# Patient Record
Sex: Female | Born: 1960
Health system: Southern US, Community
[De-identification: ages and names within clinical notes are randomized; demographics above are authoritative.]

## PROBLEM LIST (undated history)

## (undated) ENCOUNTER — Emergency Department (HOSPITAL_COMMUNITY): Admission: EM | Disposition: A | Discharge status: Still In Hospital | Payer: Medicare Other

## (undated) DIAGNOSIS — Z9114 Patient's other noncompliance with medication regimen: Secondary | ICD-10-CM

## (undated) DIAGNOSIS — F209 Schizophrenia, unspecified: Secondary | ICD-10-CM

## (undated) DIAGNOSIS — Z91148 Patient's other noncompliance with medication regimen for other reason: Secondary | ICD-10-CM

---

## 1998-10-22 ENCOUNTER — Emergency Department (HOSPITAL_COMMUNITY): Admission: EM | Admit: 1998-10-22 | Discharge: 1998-10-22 | Payer: Self-pay | Admitting: Emergency Medicine

## 1998-10-22 ENCOUNTER — Encounter: Payer: Self-pay | Admitting: Emergency Medicine

## 1999-02-11 ENCOUNTER — Other Ambulatory Visit: Admission: RE | Admit: 1999-02-11 | Discharge: 1999-02-11 | Payer: Self-pay | Admitting: Internal Medicine

## 2004-12-28 ENCOUNTER — Emergency Department (HOSPITAL_COMMUNITY): Admission: EM | Admit: 2004-12-28 | Discharge: 2004-12-28 | Payer: Self-pay | Admitting: *Deleted

## 2005-09-24 ENCOUNTER — Emergency Department (HOSPITAL_COMMUNITY): Admission: EM | Admit: 2005-09-24 | Discharge: 2005-09-24 | Payer: Self-pay | Admitting: Emergency Medicine

## 2006-02-15 ENCOUNTER — Emergency Department (HOSPITAL_COMMUNITY): Admission: EM | Admit: 2006-02-15 | Discharge: 2006-02-16 | Payer: Self-pay | Admitting: *Deleted

## 2006-05-29 ENCOUNTER — Emergency Department (HOSPITAL_COMMUNITY): Admission: EM | Admit: 2006-05-29 | Discharge: 2006-05-30 | Payer: Self-pay | Admitting: Emergency Medicine

## 2011-11-03 ENCOUNTER — Emergency Department (HOSPITAL_COMMUNITY)
Admission: EM | Admit: 2011-11-03 | Discharge: 2011-11-04 | Disposition: A | Discharge status: Other Acute Inpatient Hospital | Payer: Self-pay | Attending: Emergency Medicine | Admitting: Emergency Medicine

## 2011-11-04 ENCOUNTER — Emergency Department (HOSPITAL_COMMUNITY)
Admission: EM | Admit: 2011-11-04 | Discharge: 2011-11-06 | Disposition: A | Discharge status: Other Acute Inpatient Hospital | Payer: Medicare Other | Attending: Emergency Medicine | Admitting: Emergency Medicine

## 2011-11-04 ENCOUNTER — Encounter (HOSPITAL_COMMUNITY): Payer: Self-pay | Admitting: Emergency Medicine

## 2011-11-04 ENCOUNTER — Encounter (HOSPITAL_COMMUNITY): Payer: Self-pay

## 2011-11-04 DIAGNOSIS — R443 Hallucinations, unspecified: Secondary | ICD-10-CM | POA: Diagnosis not present

## 2011-11-04 DIAGNOSIS — F17200 Nicotine dependence, unspecified, uncomplicated: Secondary | ICD-10-CM | POA: Insufficient documentation

## 2011-11-04 DIAGNOSIS — F200 Paranoid schizophrenia: Secondary | ICD-10-CM | POA: Diagnosis not present

## 2011-11-04 HISTORY — DX: Schizophrenia, unspecified: F20.9

## 2011-11-04 LAB — COMPREHENSIVE METABOLIC PANEL
ALT: 5 U/L (ref 0–35)
AST: 14 U/L (ref 0–37)
Albumin: 4.2 g/dL (ref 3.5–5.2)
Alkaline Phosphatase: 78 U/L (ref 39–117)
BUN: 10 mg/dL (ref 6–23)
CO2: 26 mEq/L (ref 19–32)
Calcium: 9.3 mg/dL (ref 8.4–10.5)
Chloride: 104 mEq/L (ref 96–112)
Creatinine, Ser: 0.72 mg/dL (ref 0.50–1.10)
GFR calc Af Amer: >90 mL/min (ref >90)
GFR calc non Af Amer: >90 mL/min (ref >90)
Glucose, Bld: 116 mg/dL — ABNORMAL HIGH (ref 70–99)
Potassium: 3.4 mEq/L — ABNORMAL LOW (ref 3.5–5.1)
Sodium: 142 mEq/L (ref 135–145)
Total Bilirubin: 0.1 mg/dL — ABNORMAL LOW (ref 0.3–1.2)
Total Protein: 7.6 g/dL (ref 6.0–8.3)

## 2011-11-04 LAB — CBC
HCT: 42.6 % (ref 36.0–46.0)
Hemoglobin: 14.8 g/dL (ref 12.0–15.0)
MCH: 29.1 pg (ref 26.0–34.0)
MCHC: 34.7 g/dL (ref 30.0–36.0)
MCV: 83.7 fL (ref 78.0–100.0)
Platelets: 248 10*3/uL (ref 150–400)
RBC: 5.09 MIL/uL (ref 3.87–5.11)
RDW: 12.6 % (ref 11.5–15.5)
WBC: 6.4 10*3/uL (ref 4.0–10.5)

## 2011-11-04 LAB — ACETAMINOPHEN LEVEL: Acetaminophen (Tylenol), Serum: <15 ug/mL (ref 10–30)

## 2011-11-04 LAB — RAPID URINE DRUG SCREEN, HOSP PERFORMED
Amphetamines: NOT DETECTED
Barbiturates: NOT DETECTED
Benzodiazepines: NOT DETECTED
Cocaine: NOT DETECTED
Opiates: NOT DETECTED
Tetrahydrocannabinol: NOT DETECTED

## 2011-11-04 LAB — ETHANOL: Alcohol, Ethyl (B): <11 mg/dL (ref 0–11)

## 2011-11-04 MED ORDER — ONDANSETRON HCL 4 MG PO TABS: 4.0000 mg | ORAL_TABLET | Freq: Three times a day (TID) | ORAL | Status: DC | PRN

## 2011-11-04 MED ORDER — IBUPROFEN 600 MG PO TABS: 600.0000 mg | ORAL_TABLET | Freq: Three times a day (TID) | ORAL | Status: DC | PRN

## 2011-11-04 MED ORDER — ACETAMINOPHEN 325 MG PO TABS: 650.0000 mg | ORAL_TABLET | ORAL | Status: DC | PRN

## 2011-11-04 MED ORDER — ALUM & MAG HYDROXIDE-SIMETH 200-200-20 MG/5ML PO SUSP: 30.0000 mL | ORAL | Status: DC | PRN

## 2011-11-04 MED ORDER — NICOTINE 21 MG/24HR TD PT24
21.0000 mg | MEDICATED_PATCH | Freq: Every day | TRANSDERMAL | Status: DC
  Filled 2011-11-04: qty 1

## 2011-11-04 MED ORDER — ZOLPIDEM TARTRATE 5 MG PO TABS: 5.0000 mg | ORAL_TABLET | Freq: Every evening | ORAL | Status: DC | PRN

## 2011-11-04 NOTE — ED Provider Notes (Signed)
History     CSN: [id number]  Arrival date & time 11/04/11  1708   First MD Initiated Contact with Patient 11/04/11 1851      Chief Complaint  Patient presents with  . V70.1    (Consider location/radiation/quality/duration/timing/severity/associated sxs/prior treatment) HPI  Pt [facility] ED [facility]. Per nurse, the patient has diabetes and other medical concerns that they are unable to accomodate. The patient admits to being there for false accusations of being schizophrenic.  She is cooperative at this time and denies SI/HI.    History reviewed. No pertinent past medical history.  History reviewed. No pertinent past surgical history.  No family history on file.  History  Substance Use Topics  . Smoking status: Current Everyday Smoker  . Smokeless tobacco: Not on file  . Alcohol Use: Yes    OB History    Grav Para Term Preterm Abortions TAB SAB Ect Mult Living                  Review of Systems  All other systems reviewed and are negative.    Allergies  Aspirin  Home Medications  No current outpatient prescriptions on file.  BP 125/70  Pulse 81  Temp(Src) 98.8 F (37.1 C) (Oral)  Resp 18  SpO2 97%  Physical Exam  Nursing note and vitals reviewed. Constitutional: She appears well-developed and well-nourished. No distress.  HENT:  Head: Normocephalic and atraumatic.  Eyes: Pupils are equal, round, and reactive to light.  Neck: Normal range of motion. Neck supple.  Cardiovascular: Normal rate and regular rhythm.   Pulmonary/Chest: Effort normal.  Abdominal: Soft.  Neurological: She is alert.  Skin: Skin is warm and dry.    ED Course  Procedures (including critical care time)   Labs Reviewed  CBC  URINE RAPID DRUG SCREEN (HOSP PERFORMED)  COMPREHENSIVE METABOLIC PANEL  ETHANOL  ACETAMINOPHEN LEVEL   No results found.   1. Hallucination       MDM  ACT to consult        Dorthula Matas,  PA 11/04/11 1857

## 2011-11-04 NOTE — ED Notes (Signed)
Pt states she doesn't know why she's here.

## 2011-11-04 NOTE — ED Notes (Signed)
[name] [phone]: pt's sister and medical POA. Please call with any updates. States pt needs to be transferred to [facility] hospital.

## 2011-11-04 NOTE — ED Notes (Signed)
Pt has been wanded and she has 1 black wallet and an ID.  Placed at nurses station in triage and [facility] has been notified.

## 2011-11-04 NOTE — ED Notes (Signed)
Pt IVC, sherriffs department with her. Mobile Crisis took out papers because patient is hearing voices and is angry and irrational to her family

## 2011-11-04 NOTE — BH Assessment (Signed)
Assessment Note   [name] is an 51 y.o. female who was sent to [facility] by [facility] requesting pt be placed at the [place]. Per Lauris Poag at [facility], pt is unable to take care of herself and needs higher level of care. She also states she was informed placement to the [place] requires medical clearance.   Pt reports she is currently living with a "fake family." She states she has been living with said fake family "from 2000 to now, 2014." Pt states she was sent to family because of "sibling, wilful ties." Pt denies SI and HI. Pt denies [facility], stating the only voices she hears are "the lives that I posess, my babies." Pt denies visual hallucinations.   Per pt's brother, [name] [phone]), pt was diagnosed with schizophrenia "around 75." He reports pt lives with her mother and that her sister [name] is her health care power of attorney. Brother reports pt has been isolating herself from family, sitting in her room talking to internal stimuli, and smoking cigars. He states pt is prescribed Risperdal consta injections, which he believes she has not been receiving for the past 1 year. He reports patient has lost a significant amount of weight but is unsure of the exact number. 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 Pt needs hospitalization at this time.   Axis I: 295.30 Schizophrenia, Paranoid Type Axis II: Deferred Axis III: History reviewed. No pertinent past medical history. Axis IV: other psychosocial or environmental problems and problems related to social environment Axis V: 11-20 some danger of hurting self or others possible OR occasionally fails to maintain minimal personal hygiene OR gross impairment in communication  Past Medical History: History reviewed. No pertinent past medical history.  History reviewed. No pertinent past surgical history.  Family History: No family history on file.  Social History:  reports that she has been smoking.   She does not have any smokeless tobacco history on file. She reports that she drinks alcohol. She reports that she does not use illicit drugs.  Additional Social History:  Alcohol / Drug Use History of alcohol / drug use?: No history of alcohol / drug abuse Allergies:  Allergies  Allergen Reactions  . Aspirin     Home Medications:  Medications Prior to Admission  Medication Dose Route Frequency Provider Last Rate Last Dose  . acetaminophen (TYLENOL) tablet 650 mg  650 mg Oral Q4H PRN Dorthula Matas, PA      . alum & mag hydroxide-simeth (MAALOX/MYLANTA) 200-200-20 MG/5ML suspension 30 mL  30 mL Oral PRN Dorthula Matas, PA      . ibuprofen (ADVIL,MOTRIN) tablet 600 mg  600 mg Oral Q8H PRN Dorthula Matas, PA      . nicotine (NICODERM CQ - dosed in mg/24 hours) patch 21 mg  21 mg Transdermal Daily Dorthula Matas, PA      . ondansetron (ZOFRAN) tablet 4 mg  4 mg Oral Q8H PRN Dorthula Matas, PA      . zolpidem (AMBIEN) tablet 5 mg  5 mg Oral QHS PRN Dorthula Matas, PA       No current outpatient prescriptions on file as of 11/04/2011.    OB/GYN Status:  No LMP recorded. Patient is postmenopausal.  General Assessment Data Location of Assessment: [facility] Living Arrangements: Parent (mother) Can pt return to current living arrangement?: Yes Admission Status: Involuntary Is patient capable of signing voluntary admission?: No Transfer  from: Other (Comment) Museum/gallery curator) Referral Source: Other Museum/gallery curator)  Education Status Is patient currently in school?: No Highest grade of school patient has completed: 12  Risk to self Suicidal Ideation: No Suicidal Intent: No Is patient at risk for suicide?: No Suicidal Plan?: No Access to Means: No What has been your use of drugs/alcohol within the last 12 months?: denies Previous Attempts/Gestures: No How many times?: 0  Other Self Harm Risks: none Triggers for Past Attempts: None known Intentional Self Injurious Behavior: None Family  Suicide History: No Recent stressful life event(s):  (none noted) Persecutory voices/beliefs?: No Depression: No Depression Symptoms: Feeling angry/irritable Substance abuse history and/or treatment for substance abuse?: No Suicide prevention information given to non-admitted patients: Not applicable  Risk to Others Homicidal Ideation: No Thoughts of Harm to Others: No Current Homicidal Intent: No Current Homicidal Plan: No Access to Homicidal Means: No Identified Victim: none History of harm to others?: No Assessment of Violence: None Noted Violent Behavior Description: cooperative Does patient have access to weapons?: No Criminal Charges Pending?: No Does patient have a court date: No  Psychosis Hallucinations: Auditory;Visual Delusions: None noted  Mental Status Report Appear/Hygiene: Bizarre Eye Contact: Poor Motor Activity: Unremarkable Speech: Slow;Slurred Level of Consciousness: Quiet/awake Mood: Suspicious Affect: Apprehensive Anxiety Level: Minimal Thought Processes: Circumstantial Judgement: Impaired Orientation: Person Obsessive Compulsive Thoughts/Behaviors: None  Cognitive Functioning Concentration: Decreased Memory: Recent Intact IQ: Average Insight: Poor Impulse Control: Poor Appetite: Poor Weight Gain: 0  Sleep: Decreased Vegetative Symptoms: None  Prior Inpatient Therapy Prior Inpatient Therapy: Yes Prior Therapy Dates: unknown [facility]  Reason for Treatment: audiotroy hallucinations  Prior Outpatient Therapy Prior Outpatient Therapy: Yes Prior Therapy Dates: unknown Prior Therapy Facilty/Provider(s): unknown Reason for Treatment: schizophrenia  ADL Screening (condition at time of admission) Patient's cognitive ability adequate to safely complete daily activities?: Yes Patient able to express need for assistance with ADLs?: Yes Independently performs ADLs?: Yes       Abuse/Neglect Assessment  (Assessment to be complete while patient is alone) Physical Abuse: Denies Verbal Abuse: Denies Sexual Abuse: Denies Exploitation of patient/patient's resources: Denies Self-Neglect: Denies Values / Beliefs Cultural Requests During Hospitalization: None Spiritual Requests During Hospitalization: None   Advance Directives (For Healthcare) Advance Directive: Patient has advance directive, copy not in chart Type of Advance Directive: Healthcare Power of Writer not in Chart: Copy requested from family Nutrition Screen Diet: Regular Unintentional weight loss greater than 10lbs within the last month: Yes (Comment) Problems chewing or swallowing foods and/or liquids: No Home Tube Feeding or Total Parenteral Nutrition (TPN): No Patient appears severely malnourished: No Pregnant or Lactating: No  Additional Information 1:1 In Past 12 Months?: No CIRT Risk: No Elopement Risk: No Does patient have medical clearance?: Yes     Disposition:  Disposition Disposition of Patient: Referred to;Inpatient treatment program Type of inpatient treatment program: Adult Patient referred [facility]  ([facility]  On Site Evaluation by:   Reviewed with Physician:     Georgina Quint A 11/04/2011 11:54 PM

## 2011-11-04 NOTE — ED Notes (Signed)
Charge RN spoke to patients brother and explained the process to him, he is concerned that she will be discharged and hes concerned about her wellbeing. He states that she needs to go to the [place], Charge RN told him that she would make note of his concerns.

## 2011-11-04 NOTE — ED Notes (Signed)
Pt was diverted to [facility] because they called the sherriffs department and said that they were waiting on him and have a bed available

## 2011-11-04 NOTE — ED Notes (Signed)
ACT team in to consult with patient.

## 2011-11-04 NOTE — ED Notes (Signed)
Sitter at bedside.

## 2011-11-05 ENCOUNTER — Encounter (HOSPITAL_COMMUNITY): Payer: Self-pay

## 2011-11-05 DIAGNOSIS — F200 Paranoid schizophrenia: Secondary | ICD-10-CM | POA: Diagnosis not present

## 2011-11-05 MED ORDER — RISPERIDONE 0.5 MG PO TBDP
0.5000 mg | ORAL_TABLET | Freq: Two times a day (BID) | ORAL | Status: DC
  Administered 2011-11-05 – 2011-11-06 (×2): 0.5 mg via ORAL
  Filled 2011-11-05 (×5): qty 1

## 2011-11-05 MED ORDER — RISPERIDONE MICROSPHERES 25 MG IM SUSR
25.0000 mg | INTRAMUSCULAR | Status: DC
  Administered 2011-11-06: 25 mg via INTRAMUSCULAR
  Filled 2011-11-05 (×2): qty 2

## 2011-11-05 MED ORDER — DIPHENHYDRAMINE HCL 25 MG PO CAPS: 50.0000 mg | ORAL_CAPSULE | Freq: Four times a day (QID) | ORAL | Status: DC | PRN

## 2011-11-05 MED ORDER — RISPERIDONE MICROSPHERES 25 MG IM SUSR
25.0000 mg | INTRAMUSCULAR | Status: DC
  Filled 2011-11-05 (×2): qty 2

## 2011-11-05 MED ORDER — BENZTROPINE MESYLATE 1 MG PO TABS: 0.5000 mg | ORAL_TABLET | Freq: Two times a day (BID) | ORAL | Status: DC | PRN

## 2011-11-05 NOTE — Consult Note (Signed)
Reason for Consult: Schizophrenia paranoid and noncompliance Referring Physician: Ellin Saba. PA  [name] is an 51 y.o. female.  HPI: Patient was presented to the [facility] emergency department psychiatric services from the [facility] behavioral health for placement with the [employer]. Patient has a history of for working with [place] [employer]. Patient has been suffering with the schizophrenia over several years and has been noncompliant with her medications for at least one year. Patient become more irritable, agitated, angry and does not want to share information with the other people. Patient believes she does not need to be staying in hospital. Patient blames people other than her family for her being sent to the emergency department. Patient was not able to sit due to her restlessness, irritability and paranoia. She was wide awake, screening her surroundings, easily irritable and escalated. Patient denied psychotic symptoms like visual and auditory hallucinations. It is not clear patient has any delusional thoughts because of noncooperative behavior. Patient is highly guarded at this time. Patient did not answer questions like suicidal or homicidal ideation.  Past Medical History  Diagnosis Date  . Schizophrenia     History reviewed. No pertinent past surgical history.  History reviewed. No pertinent family history.  Social History:  reports that she has been smoking.  She does not have any smokeless tobacco history on file. She reports that she drinks alcohol. She reports that she does not use illicit drugs.  Allergies:  Allergies  Allergen Reactions  . Aspirin     Medications: I have reviewed the patient's current medications.  Results for orders placed during the hospital encounter of 11/04/11 (from the past 48 hour(s))  URINE RAPID DRUG SCREEN (HOSP PERFORMED)     Status: Normal   Collection Time   11/04/11  5:54 PM      Component Value Range Comment     Opiates NONE DETECTED  NONE DETECTED     Cocaine NONE DETECTED  NONE DETECTED     Benzodiazepines NONE DETECTED  NONE DETECTED     Amphetamines NONE DETECTED  NONE DETECTED     Tetrahydrocannabinol NONE DETECTED  NONE DETECTED     Barbiturates NONE DETECTED  NONE DETECTED    CBC     Status: Normal   Collection Time   11/04/11  6:30 PM      Component Value Range Comment   WBC 6.4  4.0 - 10.5 (K/uL)    RBC 5.09  3.87 - 5.11 (MIL/uL)    Hemoglobin 14.8  12.0 - 15.0 (g/dL)    HCT 16.1  09.6 - 04.5 (%)    MCV 83.7  78.0 - 100.0 (fL)    MCH 29.1  26.0 - 34.0 (pg)    MCHC 34.7  30.0 - 36.0 (g/dL)    RDW 40.9  81.1 - 91.4 (%)    Platelets 248  150 - 400 (K/uL)   COMPREHENSIVE METABOLIC PANEL     Status: Abnormal   Collection Time   11/04/11  6:30 PM      Component Value Range Comment   Sodium 142  135 - 145 (mEq/L)    Potassium 3.4 (*) 3.5 - 5.1 (mEq/L)    Chloride 104  96 - 112 (mEq/L)    CO2 26  19 - 32 (mEq/L)    Glucose, Bld 116 (*) 70 - 99 (mg/dL)    BUN 10  6 - 23 (mg/dL)    Creatinine, Ser 7.82  0.50 - 1.10 (mg/dL)  Calcium 9.3  8.4 - 10.5 (mg/dL)    Total Protein 7.6  6.0 - 8.3 (g/dL)    Albumin 4.2  3.5 - 5.2 (g/dL)    AST 14  0 - 37 (U/L)    ALT 5  0 - 35 (U/L)    Alkaline Phosphatase 78  39 - 117 (U/L)    Total Bilirubin 0.1 (*) 0.3 - 1.2 (mg/dL)    GFR calc non Af Amer >90  >90 (mL/min)    GFR calc Af Amer >90  >90 (mL/min)   ETHANOL     Status: Normal   Collection Time   11/04/11  6:30 PM      Component Value Range Comment   Alcohol, Ethyl (B) <11  0 - 11 (mg/dL)   ACETAMINOPHEN LEVEL     Status: Normal   Collection Time   11/04/11  6:30 PM      Component Value Range Comment   Acetaminophen (Tylenol), Serum <15.0  10 - 30 (ug/mL)     No results found.  Positive for aggressive behavior, behavior problems, mood swings and paranoid delusions and poor insight Blood pressure 102/68, pulse 63, temperature 98.2 F (36.8 C), temperature source Oral, resp. rate 17,  SpO2 98.00%.   Assessment/Plan: Schizophrenia, paranoid type and chronic Noncompliance with the treatment  Patient need acute psychiatric hospitalization. Patient information was sent to the behavioral health hospital and the [facility] hospitals. Start Risperdal Consta 25 mg IM Q. 14 days and Risperdal young tablets Q. 0.5 mg twice daily. Patient ligate Cosa and didn't give 0.5 mg twice daily as needed and also Benadryl 50 mg every 6 hours as needed for agitation and aggressive behavior.  [name],JANARDHAHA R. 11/05/2011, 5:45 PM

## 2011-11-05 NOTE — ED Notes (Signed)
Sister is here to see patient, when patient asked if she wants to see sister, patient stated "no". When asked about siblings in her assessment she told this nurse they were all "dead".

## 2011-11-05 NOTE — ED Notes (Signed)
Brother Marcy Salvo came by to visit pt, pt was sleeping and brother states that "she would probably get very upset if she saw me because she thinks that her sister and I are the reason that she is here.  She is blaming [place]."  Advised brother that it would not be a good idea for him to visit because so as not to upset the pt.  Brother agrees and left his contact info cell: [phone], home: [phone]

## 2011-11-05 NOTE — ED Notes (Signed)
Called report to [employer], pt's 2 belonging bags moved from locker 28 to locker 38.  Pt's money is locked in security

## 2011-11-05 NOTE — ED Notes (Signed)
CSW met with pt to review information required for [facility] referral. Pt refused to sign paper work stating "I am not going to the [place], I've been there before and I am not going back." Pt stated "I never stated I wanted to go to the [place]."   Pt's information has been sent to [facility] and [facility] for review. Oncoming CSW/ACT to reapproach pt again regarding [facility] referral on 11/06/11.

## 2011-11-06 ENCOUNTER — Encounter (HOSPITAL_COMMUNITY): Payer: Self-pay | Admitting: *Deleted

## 2011-11-06 ENCOUNTER — Inpatient Hospital Stay (HOSPITAL_COMMUNITY)
Admission: AD | Admit: 2011-11-06 | Discharge: 2011-11-13 | DRG: 885 | Disposition: A | Discharge status: Other Acute Inpatient Hospital | Payer: Medicare Other | Source: Ambulatory Visit | Attending: Psychiatry | Admitting: Psychiatry

## 2011-11-06 DIAGNOSIS — Z9119 Patient's noncompliance with other medical treatment and regimen: Secondary | ICD-10-CM | POA: Diagnosis not present

## 2011-11-06 DIAGNOSIS — F17200 Nicotine dependence, unspecified, uncomplicated: Secondary | ICD-10-CM | POA: Diagnosis present

## 2011-11-06 DIAGNOSIS — F200 Paranoid schizophrenia: Principal | ICD-10-CM | POA: Diagnosis present

## 2011-11-06 DIAGNOSIS — E873 Alkalosis: Secondary | ICD-10-CM | POA: Diagnosis not present

## 2011-11-06 DIAGNOSIS — Z91199 Patient's noncompliance with other medical treatment and regimen due to unspecified reason: Secondary | ICD-10-CM

## 2011-11-06 HISTORY — DX: Patient's other noncompliance with medication regimen for other reason: Z91.148

## 2011-11-06 HISTORY — DX: Patient's other noncompliance with medication regimen: Z91.14

## 2011-11-06 MED ORDER — MAGNESIUM HYDROXIDE 400 MG/5ML PO SUSP: 30.0000 mL | Freq: Every day | ORAL | Status: DC | PRN

## 2011-11-06 MED ORDER — ALUM & MAG HYDROXIDE-SIMETH 200-200-20 MG/5ML PO SUSP: 30.0000 mL | ORAL | Status: DC | PRN

## 2011-11-06 MED ORDER — ACETAMINOPHEN 325 MG PO TABS: 650.0000 mg | ORAL_TABLET | Freq: Four times a day (QID) | ORAL | Status: DC | PRN

## 2011-11-06 NOTE — Consult Note (Signed)
Reason for Consult: Psychosis and paranoia Referring Physician: Ellin Saba, PA  [name] is an 51 y.o. female.  HPI: Patient continued to be suffering with the paranoid delusions increase the irritability but no agitation or aggressive behavior. Patient is cooperative with the taking Risperdal tablets and will give her a long-acting shot whenever available for noncompliant behaviors with a treatment.  Past Medical History  Diagnosis Date  . Schizophrenia     History reviewed. No pertinent past surgical history.  History reviewed. No pertinent family history.  Social History:  reports that she has been smoking.  She does not have any smokeless tobacco history on file. She reports that she drinks alcohol. She reports that she does not use illicit drugs.  Allergies:  Allergies  Allergen Reactions  . Aspirin     Medications: I have reviewed the patient's current medications.  Results for orders placed during the hospital encounter of 11/04/11 (from the past 48 hour(s))  URINE RAPID DRUG SCREEN (HOSP PERFORMED)     Status: Normal   Collection Time   11/04/11  5:54 PM      Component Value Range Comment   Opiates NONE DETECTED  NONE DETECTED     Cocaine NONE DETECTED  NONE DETECTED     Benzodiazepines NONE DETECTED  NONE DETECTED     Amphetamines NONE DETECTED  NONE DETECTED     Tetrahydrocannabinol NONE DETECTED  NONE DETECTED     Barbiturates NONE DETECTED  NONE DETECTED    CBC     Status: Normal   Collection Time   11/04/11  6:30 PM      Component Value Range Comment   WBC 6.4  4.0 - 10.5 (K/uL)    RBC 5.09  3.87 - 5.11 (MIL/uL)    Hemoglobin 14.8  12.0 - 15.0 (g/dL)    HCT 16.1  09.6 - 04.5 (%)    MCV 83.7  78.0 - 100.0 (fL)    MCH 29.1  26.0 - 34.0 (pg)    MCHC 34.7  30.0 - 36.0 (g/dL)    RDW 40.9  81.1 - 91.4 (%)    Platelets 248  150 - 400 (K/uL)   COMPREHENSIVE METABOLIC PANEL     Status: Abnormal   Collection Time   11/04/11  6:30 PM      Component Value Range  Comment   Sodium 142  135 - 145 (mEq/L)    Potassium 3.4 (*) 3.5 - 5.1 (mEq/L)    Chloride 104  96 - 112 (mEq/L)    CO2 26  19 - 32 (mEq/L)    Glucose, Bld 116 (*) 70 - 99 (mg/dL)    BUN 10  6 - 23 (mg/dL)    Creatinine, Ser 7.82  0.50 - 1.10 (mg/dL)    Calcium 9.3  8.4 - 10.5 (mg/dL)    Total Protein 7.6  6.0 - 8.3 (g/dL)    Albumin 4.2  3.5 - 5.2 (g/dL)    AST 14  0 - 37 (U/L)    ALT 5  0 - 35 (U/L)    Alkaline Phosphatase 78  39 - 117 (U/L)    Total Bilirubin 0.1 (*) 0.3 - 1.2 (mg/dL)    GFR calc non Af Amer >90  >90 (mL/min)    GFR calc Af Amer >90  >90 (mL/min)   ETHANOL     Status: Normal   Collection Time   11/04/11  6:30 PM      Component Value Range Comment  Alcohol, Ethyl (B) <11  0 - 11 (mg/dL)   ACETAMINOPHEN LEVEL     Status: Normal   Collection Time   11/04/11  6:30 PM      Component Value Range Comment   Acetaminophen (Tylenol), Serum <15.0  10 - 30 (ug/mL)     No results found.  Positive for paranoid delusions and guarded. Blood pressure 116/70, pulse 72, temperature 97.7 F (36.5 C), temperature source Oral, resp. rate 18, SpO2 99.00%.   Assessment/Plan: Schizophrenia, paranoid and noncompliant with the treatment  Will continue with the current medication treatment recommendation and acute psychiatric hospitalization placement as soon as available.  [name],JANARDHAHA R. 11/06/2011, 10:48 AM

## 2011-11-06 NOTE — Tx Team (Signed)
Initial Interdisciplinary Treatment Plan  PATIENT STRENGTHS: (choose at least two) Average or above average intelligence Supportive family/friends  PATIENT STRESSORS: Medication change or noncompliance   PROBLEM LIST: Problem List/Patient Goals Date to be addressed Date deferred Reason deferred Estimated date of resolution  Schizophrenic 11/06/11                                                      DISCHARGE CRITERIA:  Improved stabilization in mood, thinking, and/or behavior Need for constant or close observation no longer present Verbal commitment to aftercare and medication compliance  PRELIMINARY DISCHARGE PLAN: Return to previous living arrangement  PATIENT/FAMIILY INVOLVEMENT: This treatment plan has been presented to and reviewed with the patient, [name].  Talmadge Chad 11/06/2011, 8:50 PM

## 2011-11-06 NOTE — ED Provider Notes (Addendum)
Medical screening examination/treatment/procedure(s) were conducted as a shared visit with non-physician practitioner(s) and myself.  I personally evaluated the patient during the encounter   Patient is alert and cooperative. Now. She would like to go home. She denies active suicidal or homicidal ideation. She seen yesterday by psychiatry, who would like to have her admitted to the hospital. She is currently under IVC.   Flint Melter, MD 11/06/11 1610  Flint Melter, MD 11/06/11 [phone]

## 2011-11-06 NOTE — Progress Notes (Signed)
Patient ID: [name], female   DOB: 24-Apr-1961, 51 y.o.   MRN: [id number] This is a 51 y.o. S/AA/F invol. admission with a Dx of Schizophrenia, Paranoid Type. She was uncooperative with the admission process. Refused to answer questions stating she does not want to be here and wants to go home. Presents with an angry mood and affect, sitting with her arms crossed, refusing to make eye contact. No evidence of hallucinations noted. Could not assess thought content, as she refused to speak. Would not give any information regarding past medical or psychiatric history. Initially refused the skin assessment/search, but then was compliant. No report of suicidal/homicidal ideation. Refused an offer of food/drink. Very suspicious and cautious walking onto the 400 hall. Was not interested in the orientation to the unit.

## 2011-11-06 NOTE — Discharge Planning (Signed)
CSW confirmed with Joey that patient's referral packet was recvd. No additional needed at this time. There are also no beds available.  Oncoming staff will continue to follow up on patient's disposition and pursue other inpt psych facilities.  Manson Passey Vegas Fritze ANN S , MSW, LCSWA 11/06/2011 2:31 PM [phone]

## 2011-11-06 NOTE — ED Notes (Signed)
Pt accepted to [facility]. Lynann Bologna, NP to Dr. Allena Katz [phone]). Completed support documentation and updated EDP & RN. Pt is IVC and will be transported via [employer].

## 2011-11-07 DIAGNOSIS — F200 Paranoid schizophrenia: Secondary | ICD-10-CM | POA: Diagnosis present

## 2011-11-07 MED ORDER — LORAZEPAM 2 MG/ML IJ SOLN
2.0000 mg | INTRAMUSCULAR | Status: DC | PRN
  Administered 2011-11-10: 2 mg via INTRAMUSCULAR
  Filled 2011-11-07: qty 1

## 2011-11-07 MED ORDER — LORAZEPAM 1 MG PO TABS: 2.0000 mg | ORAL_TABLET | ORAL | Status: DC | PRN

## 2011-11-07 MED ORDER — BENZTROPINE MESYLATE 0.5 MG PO TABS
0.5000 mg | ORAL_TABLET | Freq: Two times a day (BID) | ORAL | Status: DC
  Filled 2011-11-07 (×10): qty 1

## 2011-11-07 MED ORDER — RISPERIDONE 0.5 MG PO TBDP: 0.5000 mg | ORAL_TABLET | ORAL | Status: DC | PRN

## 2011-11-07 MED ORDER — RISPERIDONE 0.5 MG PO TBDP
0.5000 mg | ORAL_TABLET | Freq: Two times a day (BID) | ORAL | Status: DC
  Filled 2011-11-07 (×10): qty 1

## 2011-11-07 NOTE — Progress Notes (Signed)
[facility] Group Notes:  (Counselor/Nursing/MHT/Case Management/Adjunct)  11/07/2011 11 AM  Type of Therapy:  Group Therapy, Dance/Movement Therapy   Participation Level:  Did Not Attend  Pt. Did not attend aftercare planning group.    [name]  

## 2011-11-07 NOTE — [facility] Suicide Risk Assessment (Signed)
Suicide Risk Assessment  Admission Assessment     Demographic factors:  Assessment Details Time of Assessment: Admission Current Mental Status:    Objective: Appearance: Fairly Groomed family reports significant weight loss   Psychomotor Activity: Normal in bed   Eye Contact:: Fair   Speech: loud -very irritable   Volume: Increased   Mood: Irritable , flat  Affect: Congruent and Depressed   Thought Process: not clear rational, circumstantial   Orientation: Other: won't answer but does not appear oriented   Thought Content: delusional about food will cure her   Suicidal Thoughts: No   Homicidal Thoughts: No   Judgement: Impaired   Insight: Lacking     Loss Factors:  Loss Factors: Financial problems / change in socioeconomic status Historical Factors:   non compliance with meds Risk Reduction Factors:   good family suppport  CLINICAL FACTORS:   Schizophrenia:   Paranoid or undifferentiated type  COGNITIVE FEATURES THAT CONTRIBUTE TO RISK:  Closed-mindedness    SUICIDE RISK:   Mild:  Suicidal ideation of limited frequency, intensity, duration, and specificity.  There are no identifiable plans, no associated intent, mild dysphoria and related symptoms, good self-control (both objective and subjective assessment), few other risk factors, and identifiable protective factors, including available and accessible social support.   DIAGNOSIS:   AXIS I  Schizophrenia,  non-compliant for unknown length of time   AXIS II  Deferred   AXIS III  See medical history.   AXIS IV  non-compliance   AXIS V  21-30 behavior considerably influenced by delusions or hallucinations OR serious impairment in judgment, communication OR inability to function in almost all areas    Treatment Plan Summary:   Admit for safety & stabilization.  Transfer to [facility] will be explored as per family's request  Start Risperdal .5 mg BID and Risperdal Consta 25 mg IM given in ED  Will increase risperidone tomorrow  to 2 mg QHS  Bonnee Zertuche 11/07/2011, 4:02 PM

## 2011-11-07 NOTE — H&P (Signed)
  Pt was seen by me today and I agree with the key elements documented in H&P.  

## 2011-11-07 NOTE — Progress Notes (Signed)
Patient ID: [name], female   DOB: 01-Apr-1961, 51 y.o.   MRN: [id number] 11-07-11 nursing shift note;  Pa went in to see this pt this am about 0930 am and the pt refused to speak with her, pa reported. The patient's sister [name] has been appointed her mental health guardian. Her ph #  Is [phone] legal papers are in  the chart. She want to be included in treatment and discharge plans and wants f/u care with [facility] rn attempted to give pt her risperdal and cogentin at 1056 am and she refused. Also she refused her 1700 medications. She stated " i have the right to refuse and they need to prove that diagnosis". rn made providers aware of the refusal. md met with pt this am and she was very delusional referring to he admission here is a "gov't conspiracy". On her inventory sheet she wrote slept well, appetite good, energy normal, attention good with depression and hopelessness written as not applicable. W/d symptoms not applicable. Denied any suicidal or homicidal ideation within the last 24 hours. Physical problem blurred vision. No pain. Changes she plans to make when she goes home was written as "maintain-kept it the same". She stated at discharge regarding staying on her medications she wrote "not on medication- feels like she didn't need it wrong medication w/ forced compliance through the [place] network". She was given lunch on the unit and dinner on the unit. This pt continues to be volatile. rn will monitor and q 15 min cks continue.

## 2011-11-07 NOTE — H&P (Signed)
  Psychiatric Admission Assessment Adult  Patient Identification:  [name] Date of Evaluation:  11/07/2011 51 yo SAAF  CC:  IVC due to [facility] being angry and irrational. History of Present Illness:: Family called [place] crisis unit attempting to have patient admitted to [place] in [place]. Apparently she has been diagnosed as schizophrenic (1987) but has been non-compliant with meds for an unknown length of time. Patient's brother Amali Uhls [phone] provided information-that she has been isolating responding to internal stimuli smoking cigars and has lost considerable weight.    Past Psychiatric History: Diagnosed as schizophrenic around 63  Substance Abuse History: None known   Social History:    reports that she has been smoking.  She does not have any smokeless tobacco history on file. She reports that she drinks alcohol. She reports that she does not use illicit drugs. Graduated HS [employer] 4 years  Won't answer other questions.   Family Psych History: No other schizophrenics   Past Medical History:     Past Medical History  Diagnosis Date  . Schizophrenia       No past surgical history on file.  Allergies:  Allergies  Allergen Reactions  . Aspirin     Current Medications:  Prior to Admission medications   Not on File    Mental Status Examination/Evaluation: Objective:  Appearance: Fairly Groomed family reports significant weight loss   Psychomotor Activity:  Normal in bed   Eye Contact::  Fair  Speech:  loud -very irritable   Volume:  Increased  Mood:  Irritable   Affect:  Congruent and Depressed  Thought Process: not clear rational or goal oriented    Orientation:  Other:  won't answer but does not appear oriented   Thought Content:  Hallucinations: Auditory  Suicidal Thoughts:  No  Homicidal Thoughts:  No  Judgement:  Impaired  Insight:  Lacking    DIAGNOSIS:    AXIS I Psychotic Disorder NOS non-compliant for unknown length of time   AXIS II  Deferred  AXIS III See medical history.  AXIS IV non-compliance   AXIS V 21-30 behavior considerably influenced by delusions or hallucinations OR serious impairment in judgment, communication OR inability to function in almost all areas     Treatment Plan Summary: Admit for safety & stabilization. Transfer to [facility] will be explored as per family's request  Start Risperdal .5 mg BID and Risperdal  Consta 25 mg IM given in ED

## 2011-11-08 NOTE — Progress Notes (Signed)
Patient ID: [name], female   DOB: 12/23/1960, 51 y.o.   MRN: [id number] Has spent the evening in bed, up once to go to bathroom, but quickly went back to bed and avoided conversation, acting like she was asleep again, with covers up, nearly covering her head.  Not responding verbally and trying to act like she's asleep, so have been unable to assess.  Resp have been regular, unlabored.  Will continue to monitor.

## 2011-11-08 NOTE — Progress Notes (Signed)
Patient ID: [name], female   DOB: 1961-03-22, 51 y.o.   MRN: [id number] 11-08-11 nursing shift note: pt continues to refuse her medications. She states she wants "them to prove her diagnosis" before she will take the medications. On his inventory sheet she wrote sleep well, appetite good, energy high, attention good with depression and hopelessness at 1.  W/d symptoms none. Denies any thoughts of suicide or hurting herself in the last 24 hours. Physical problems in the last 24 hours are none. No pain. Plans to make no changes when she goes home. She stated she didn't see any problems staying on meds at discharge. She has stayed in her room a great deal of the day.  rn will monitor and q 15 minute checks continue.

## 2011-11-08 NOTE — Progress Notes (Signed)
[facility] Group Notes:  (Counselor/Nursing/MHT/Case Management/Adjunct)  11/08/2011 1030  Type of Therapy:  Discharge Planning  Participation Level:  Active  Summary of Progress/Problems: Pt attended aftercare planning group. Pt stated that she saw the doctor yesterday and had changes to her medications. Pt was talkative this morning, however, at times was not clear and seemed confused. Pt states she is having a good day.   Crosswell, Desiree 11/08/2011, 2:54 PM

## 2011-11-08 NOTE — Progress Notes (Signed)
Patient ID: [name], female   DOB: [birth year], 51 y.o.   MRN: [id number] Pt. was asked to sign a consent form to talk sister but stated the person  Was a " imposter" Pt. Was psychotic at times and [facility] staff next week need to see if the pt. Will sign the consent form. Pt. did answer the questions on the PSA.  The consent form needs to be signed by the pt. and contact needs to be made next week with her sister who is her health care power of attorney. Pt.'s  sister contact information in Epic.

## 2011-11-08 NOTE — Progress Notes (Signed)
[facility] Adult Inpatient Family/Significant Other Suicide Prevention Education  Suicide Prevention Education:  Patient Refusal for Family/Significant Other Suicide Prevention Education: The patient [name] has refused to provide written consent for family/significant other to be provided Family/Significant Other Suicide Prevention Education during admission and/or prior to discharge.  Physician notified.  Lamar Blinks [place] 11/08/2011, 5:53 PM

## 2011-11-08 NOTE — Progress Notes (Signed)
Patient ID: [name], female   DOB: 1961-07-11, 51 y.o.   MRN: [id number]  Not willing to take any meds right now on repeated efforts. Able to sleep and eat well. Stays in her room most of the time. Not social. Gets angry when asked about about med complaince. Very delusional about meds now. Says she got a shot recently in the ED (risperidine) so she does not need any meds. Denies being at [facility] in the past but per family she was there many times in the past.  Current Mental Status:  Objective: Appearance: Fairly Groomed   Psychomotor Activity: Normal in bed   Eye Contact:: Fair   Speech: rapid  Volume: Increased   Mood:  flat   Affect: Congruent and Depressed   Thought Process: not clear rational, circumstantial   Orientation: Other: won't answer but does not appear oriented   Thought Content: delusional (food will cure her)  Suicidal Thoughts: No   Homicidal Thoughts: No   Judgement: Impaired   Insight: impaird        DIAGNOSIS:  AXIS I  Schizophrenia, non-compliant with meds (for many years)  AXIS II  Deferred   AXIS III  See medical history.   AXIS IV  non-compliance   AXIS V  21-30 behavior considerably influenced by delusions or hallucinations OR serious impairment in judgment, communication OR inability to function in almost all areas    Treatment Plan Summary:   [facility]'s [facility]  Continue to encourged Risperdal PO. Per chart she got risperidone consta in ED Consider forced med consult. In my opinion pt does not have capacity to refuse meds for psychosis at this time

## 2011-11-08 NOTE — Progress Notes (Signed)
[facility] Group Notes:  (Counselor/Nursing/MHT/Case Management/Adjunct)  11/08/2011 2:14 PM  Type of Therapy:  Group Therapy  Participation Level:  Active  Participation Quality:  Appropriate  Affect:  Appropriate  Cognitive:  Appropriate  Insight:  Good  Engagement in Group:  Good  Engagement in Therapy:  Good  Modes of Intervention:  Clarification, Limit-setting, Socialization and Support  Summary of Progress/Problems:Pt. Participated in group session on supports, and who they are. Pt. Reported that the wha she is shown to her ins by being polite and hand shakeing. Pt. stated that her gandparents are her supports. Pt. Did make some statements that did not make sense to the group after she said in.   Lamar Blinks [place] 11/08/2011, 2:14 PM

## 2011-11-09 DIAGNOSIS — F200 Paranoid schizophrenia: Secondary | ICD-10-CM

## 2011-11-09 MED ORDER — BENZTROPINE MESYLATE 0.5 MG PO TABS
0.5000 mg | ORAL_TABLET | ORAL | Status: DC
  Filled 2011-11-09 (×11): qty 1

## 2011-11-09 MED ORDER — RISPERIDONE 2 MG PO TBDP: 3.0000 mg | ORAL_TABLET | Freq: Two times a day (BID) | ORAL | Status: DC

## 2011-11-09 MED ORDER — HALOPERIDOL LACTATE 5 MG/ML IJ SOLN
5.0000 mg | Freq: Every evening | INTRAMUSCULAR | Status: DC | PRN
  Administered 2011-11-10: 5 mg via INTRAMUSCULAR
  Filled 2011-11-09: qty 1

## 2011-11-09 MED ORDER — RISPERIDONE 2 MG PO TBDP
3.0000 mg | ORAL_TABLET | Freq: Every day | ORAL | Status: DC
  Administered 2011-11-09: 3 mg via ORAL
  Filled 2011-11-09 (×3): qty 1

## 2011-11-09 MED ORDER — HALOPERIDOL LACTATE 5 MG/ML IJ SOLN: 5.0000 mg | Freq: Four times a day (QID) | INTRAMUSCULAR | Status: DC | PRN

## 2011-11-09 NOTE — Progress Notes (Signed)
[facility] MD Progress Note  11/09/2011 5:29 PM  Diagnosis:  Axis I: Schizophrenia - Paranoid Type.  The patient was seen today and refuses to answer any questions.  The following is based on observation:   Sleep: The patient appears to be sleeping reasonably well.  Appetite: The patient would not respond.   Mild>(1-10) >Severe  Hopelessness (1-10): (Would not answer) Depression (1-10):  (Would not answer) Anxiety (1-10): (Would not answer)     Suicidal Ideation: The patient does not appear to be having suicidal ideations. Plan: No  Intent: No  Means: No   Homicidal Ideation: The patient does not appear to be having homicidal ideations today.  Plan: No  Intent: No.  Means: No   Eye Contact: Poor.  General Appearance/Behavior: The patient was angry and uncooperative and refused to answer any questions today. Motor Behavior: wnl.  Speech: Increased in volume but was appropriate rate.  Mental Status: Alert and Oriented x 3  Level of Consciousness: Alert.  Mood: Agitated.  Affect: Angry.  Anxiety: None noted today.  Thought Process: Psychotic.  Thought Content: The patient appears to be responding to internal stimuli and voices the delusion that her family is dead and have been substituted by imposters (Capgras Syndrome).  Perception: Psychotic.  Judgment: Poor.  Insight: Poor.  Cognition: Orientated to person, place and time.  Sleep:  Number of Hours: 6.75    Vital Signs:Blood pressure 100/69, pulse 71, temperature 98.1 F (36.7 C), temperature source Oral, resp. rate 16, height 5\' 3"  (1.6 m), weight 58.06 kg (128 lb).  Current Medications: Current Facility-Administered Medications  Medication Dose Route Frequency Provider Last Rate Last Dose  . acetaminophen (TYLENOL) tablet 650 mg  650 mg Oral Q6H PRN Mike Craze, MD      . alum & mag hydroxide-simeth (MAALOX/MYLANTA) 200-200-20 MG/5ML suspension 30 mL  30 mL Oral Q4H PRN Mike Craze, MD      . benztropine (COGENTIN)  tablet 0.5 mg  0.5 mg Oral BID Mickie D. Pernell Dupre, PA      . LORazepam (ATIVAN) injection 2 mg  2 mg Intramuscular Q4H PRN Mickie D. Pernell Dupre, PA      . LORazepam (ATIVAN) tablet 2 mg  2 mg Oral Q4H PRN Mickie D. Adams, PA      . magnesium hydroxide (MILK OF MAGNESIA) suspension 30 mL  30 mL Oral Daily PRN Mike Craze, MD      . risperiDONE (RISPERDAL M-TABS) disintegrating tablet 0.5 mg  0.5 mg Oral BID Mickie D. Adams, PA      . risperiDONE (RISPERDAL M-TABS) disintegrating tablet 0.5 mg  0.5 mg Oral Q4H PRN Mickie D. Adams, PA       Lab Results: No results found for this or any previous visit (from the past 48 hour(s)).  Time was spent today attempting to discuss with the patient the reason for admission.  The patient was angry and uncooperative and refused to answer any questions.  She continued to state that her family was dead and the individuals claiming to be her family "were lying."    Considering the severity of the patient's illness and refusal of medications, a forced medication order is indicated.  Treatment Plan Summary:  1. Daily contact with patient to assess and evaluate symptoms and progress in treatment  2. Medication management  3. The patient will deny suicidal ideations or homicidal ideations for 48 hours prior to discharge and have a depression and anxiety rating of 3 or less. The  patient will also deny any auditory or visual hallucinations or delusional thinking or display any manic or hypomanic behaviors.  4. The patient will display no evidence of substance withdrawal at time of discharge.   Plan:  1. The case was discussed with another Psychiatrist on the unit, Dr. Dan Humphreys and a second opinion for a forced medication order was obtained. 2. The patient will be given Risperdal M-tabs 3 mgs po qhs for psychosis and if he refuses a forced medication order for Haldol 5 mgs IM should be given. 3. Laboratory studies reviewed.   4. Will continue to monitor.   [name] 11/09/2011, 5:29 PM

## 2011-11-09 NOTE — Discharge Planning (Addendum)
Met with patient in Aftercare Planning Group.   She was irritable and would not answer questions, stating she was tired of answering those questions.  She does apparently live with her mother, and her sister [facility]) wants her to have follow-up arranged with the [facility].  Patient reported that she has no case management needs today.  Ambrose Mantle, LCSW 11/09/2011, 10:04 AM   It was noted that patient responded to internal stimuli throughout Aftercare Planning Group, turning her head frequently as though to listen to someone.  Per State Regulation 482.30  This chart was reviewed for medical necessity with respect to the patient's Admission/Duration of stay.   Next review due:  11/12/11   Ambrose Mantle, LCSW  11/09/2011  10:07 AM

## 2011-11-09 NOTE — Progress Notes (Signed)
Patient ID: [name], female   DOB: September 20, 1960, 51 y.o.   MRN: [id number] The patient remained in bed all evening. No interaction in the milieu. Refuses all offers of medication. Her family is concerned that she may not be eating enough. Will monitor meals.

## 2011-11-09 NOTE — Tx Team (Signed)
Interdisciplinary Treatment Plan Update (Adult)  Date:  11/09/2011  Time Reviewed:  10:15AM-11:00AM  Progress in Treatment: Attending groups:  Yes Participating in groups:  No, angrily says will not answer questions Taking medication as prescribed:    No, refusing -- 2nd opinion on forced meds will be sought today Tolerating medication:   [name] Family/Significant other contact made:  Needed with [facility] POA Patient understands diagnosis:   No Discussing patient identified problems/goals with staff:   No, "I'm not interested" is her response to every questions when invited to treatment team Medical problems stabilized or resolved:   Yes Denies suicidal/homicidal ideation:  Will not respond Issues/concerns per patient self-inventory:   None Other:    New problem(s) identified: Yes, Describe:  Not accepting medications at all  Reason for Continuation of Hospitalization: Aggression Delusions  Hallucinations Medication stabilization Other; describe non-compliance with treatment  Interventions implemented related to continuation of hospitalization:  Medication monitoring and adjustment, safety checks Q15 min., suicide risk assessment, group therapy, psychoeducation, collateral contact, aftercare planning, ongoing physician assessments, medication education  Additional comments:  Not applicable  Estimated length of stay:  4-5 days  Discharge Plan:  Lives with mother, follows up with [facility]  New goal(s):  Not applicable  Review of initial/current patient goals per problem list:   1.  Goal(s):  Reduce auditory visual hallucinations to baseline per HCPOA, family, and self report.  Met:  No  Target date:  By Discharge   As evidenced by:  Continues to respond to internal stimuli.  2.  Goal(s):  Determine if & how to address weight loss, not eating properly while in the hospital.  Met:  No  Target date:  By Discharge   As evidenced by:  Weekend staff reports patient is not eating  well.  3.  Goal(s):  Determine whether it is possible to transfer to [facility]  Met:  No  Target date:  By Discharge   As evidenced by:  In progress  4.  Goal(s):  Reestablish medication regimen  Met:  No  Target date:  By Discharge   As evidenced by:  Refusing meds, 2nd opinion to be sought today re forced meds  Attendees: Patient:  [name]  11/09/2011 10:15AM-11:00AM  Family:     Physician:  Dr. Harvie Heck Readling 11/09/2011 10:15AM-11:00AM  Nursing:   Tacy Learn, RN 11/09/2011 10:15AM -11:00AM   Case Manager:  Ambrose Mantle, LCSW 11/09/2011 10:15AM-11:00AM  Counselor:  Veto Kemps, MT-BC 11/09/2011 10:15AM-11:00AM  Other:      Other:      Other:      Other:       Scribe for Treatment Team:   Sarina Ser, 11/09/2011, 10:15AM-11:15AM

## 2011-11-09 NOTE — Progress Notes (Signed)
Patient ID: [name], female   DOB: Jul 28, 1960, 51 y.o.   MRN: [id number] Pt did not fill out her self inventory.  She agreed to come to treatment team .  Once thee , she refused to answer any questions, but did say that her sister is dead and he brother is dead.  She said this angrily when asked if there is anyone we can call.  Pt refused to answer if she is hearing voices or what brought he to the hospital.  She is in her room and ha refused to take medications.

## 2011-11-09 NOTE — Progress Notes (Signed)
[facility] Group Notes:  (Counselor/Nursing/MHT/Case Management/Adjunct)  11/09/2011 2:39 PM  Type of Therapy:  Group Therapy  Participation Level:  None  Summary of Progress/Problems: Even when giving her name, patient stated that she was not going to cooperate. She was irritable and angry as attempts were made to engage her in group.   HartisAram Beecham 11/09/2011, 2:39 PM

## 2011-11-09 NOTE — Progress Notes (Signed)
Pt does not attend groups and does not take medications. Pt has a force med order but did take her 2200 medication. Pt is rude and does not want to be here. Pt was offered support and encouragement. Pt safety maintained on unit.

## 2011-11-10 NOTE — Progress Notes (Signed)
Patient ID: [name], female   DOB: Jun 25, 1961, 51 y.o.   MRN: [id number]   "I've given my all and I don't have any more to give." "I've already talked about that and I'm not talking about it again."   [name] presents fully alert, guarded affect, no eye contact, irritable mood. She tolerates me walking with her to her room but is completely resistant to any type of conversation. She repeatedly refuses to answer any questions.  Mood is irritable and guarded.  Thoughts - paranoia, not verbalizing any dangerous ideas.  Denies suicidal thoughts. Oriented to person and place.  Also refuses to discuss medical history, allergies, surgeries, and hospitalizations.   [name] refused her risperdal last pm, but did not get the Haldol injection that was ordered.    Plan: Reviewed in team meeting. Continue current plan.

## 2011-11-10 NOTE — Progress Notes (Signed)
Pt. Refused the risperdal & cogentin @ HS . Pt. Has a force medication order. Pt. Was given the haldol 5mg . IM & while yelling & agitated ,pt. Rolled up her sleeve to receive the shot. Pt. Was also given  Ativan 2 mg.Im for agitation.Pt. Has been withdrawn to her room all shift except went to dinner. Continues on 15 minute checks. Pt. Safety maintained.

## 2011-11-10 NOTE — Progress Notes (Signed)
Lying quietly in bed with eyes closed.  Safety checks conducted Q 15 minutes. 

## 2011-11-10 NOTE — Progress Notes (Signed)
Patient ID: [name], female   DOB: 1961/03/26, 51 y.o.   MRN: [id number] Pt allowed staff to fill out her self inventory.  She reports sleeping well.  She denies depression or hopelessness.  SH doesn't think she needs to be here or that she needs to change anything.  She denies any thoughts of self harm.  Tried to talk with pt about her reluctance to take meds .  She sounded angry and said she doesn't think staff has the right information about her.

## 2011-11-10 NOTE — Progress Notes (Signed)
Patient ID: [name], female   DOB: 1961/06/10, 51 y.o.   MRN: [id number] Pt ate all her lunch today.

## 2011-11-10 NOTE — Discharge Planning (Signed)
Met with patient in Aftercare Planning Group.   She expressed no case management needs today, actually did not speak at all, just shook her head.  Yesterday evening, Case Manager spoke with her sister who is Health Care Power of [place].  Left papers re desire to transfer to [facility] for her signature.  These were returned to Case Manager this morning, and CM will continue to complete referral today.  Ambrose Mantle, LCSW 11/10/2011, 9:43 AM

## 2011-11-10 NOTE — Discharge Planning (Signed)
In examining paperwork, it should be noted that patient's sister is not just her Health Care Power of [address].  She is actually her legal guardian.  Referral has been completed to [facility].  Ambrose Mantle, LCSW 11/10/2011, 2:11 PM

## 2011-11-11 ENCOUNTER — Encounter (HOSPITAL_COMMUNITY): Payer: Self-pay | Admitting: Physician Assistant

## 2011-11-11 DIAGNOSIS — Z9119 Patient's noncompliance with other medical treatment and regimen: Secondary | ICD-10-CM

## 2011-11-11 DIAGNOSIS — E873 Alkalosis: Secondary | ICD-10-CM | POA: Diagnosis present

## 2011-11-11 MED ORDER — RISPERIDONE 1 MG PO TBDP
3.0000 mg | ORAL_TABLET | Freq: Every day | ORAL | Status: DC
  Filled 2011-11-11 (×4): qty 3

## 2011-11-11 MED ORDER — HALOPERIDOL LACTATE 5 MG/ML IJ SOLN
5.0000 mg | Freq: Every day | INTRAMUSCULAR | Status: DC
  Administered 2011-11-11 – 2011-11-12 (×2): 5 mg via INTRAMUSCULAR
  Filled 2011-11-11 (×5): qty 1

## 2011-11-11 NOTE — Progress Notes (Signed)
Patient continues to be irritable and angry. Isolates to her room all evening. She refused her HS PO medications. Writer told patient she had an order by Doctor to force medication. Pt stated  " It has to be a force compliance". Writer told another RN to talk to patient about taking her PO medication. She refused second attempt; Clinical research associate notified AC . Writer and [facility] went to patient room for the third attempt, offered the PO medication , she refused it again, [facility] told patient we had to administer the IM injection Haldol. Patient pulled her sleeve up and said, "it has to be a force compliance I said". She received the IM injection without difficulty, or agitation. Pt tolerated injection. Q 15 minute check continues to maintain safety.

## 2011-11-11 NOTE — Progress Notes (Signed)
Pt has been irritable refusing to answer any questions from this Clinical research associate. Pt sitting in a chair within her room and turns away when writer tries to talk with pt. She refuses to complete self inventory and refused morning medication. Reported to MD. Safety maintained on unit.

## 2011-11-11 NOTE — Discharge Planning (Signed)
Patient did attend Aftercare Planning Group, but became angry and yelled, then abruptly and loudly left the room.  Case Manager did inform her before she left that we [facility], per her sister's request.  This is actually what made her so angry, as she yelled that her sister "is DECEASED I told you!!!!"  Ambrose Mantle, LCSW 11/11/2011, 10:38 AM

## 2011-11-11 NOTE — Progress Notes (Addendum)
[facility] MD Progress Note  11/11/2011 1:51 PM  Diagnosis:  Axis I: Schizophrenia - Paranoid Type. Since today is my first day on this unit I introduced myself to the patient, closed the door to the room without any objection from the patient to give her some privacy.  Her eye contact is poor, she is sitting in a chair and refuses to speak to me.   She is responding to internal stimulation, and shakes her head as if answering questions from someone. Eventually she does respond to my inquiry. She states she is" here by force, does not want to be here, and no longer gives a damn!" She is angry and frustrated. She states "I'm just waiting for the federal government to shut this place down."  Sleep: The patient appears to be sleeping reasonably well.  Appetite: As well as I ever did.   Mild>(1-10) >Severe  Hopelessness (1-10): (Would not answer) Depression (1-10):  (Would not answer) Anxiety (1-10): (Would not answer)     Suicidal Ideation: The patient does not appear to be having suicidal ideations. Plan: No  Intent: No  Means: No   Homicidal Ideation: The patient does not appear to be having homicidal ideations today.  Plan: No  Intent: No.  Means: No   Eye Contact: Poor.  General Appearance/Behavior: The patient was angry and uncooperative and refused to answer any questions today.  She is neatly dressed. Motor Behavior: wnl.  Speech: Increased in volume but was appropriate rate.  Mental Status: Alert and Oriented x 3  Level of Consciousness: Alert.  Mood: Agitated.  Affect: Angry.  Anxiety: None noted today.  Thought Process: Psychotic.  Thought Content: The patient appears to be responding to internal stimuli Judgment: Poor.  Insight: Poor.  Cognition: She is alert and aware of her surroundings, but her orientation is uncertain..   Sleep:  Number of Hours: 6.75    Vital Signs:Blood pressure 93/68, pulse 88, temperature 98.1 F (36.7 C), temperature source Oral, resp. rate 16,  height 5\' 3"  (1.6 m), weight 58.06 kg (128 lb).  Current Medications: Current Facility-Administered Medications  Medication Dose Route Frequency Provider Last Rate Last Dose  . acetaminophen (TYLENOL) tablet 650 mg  650 mg Oral Q6H PRN Mike Craze, MD      . alum & mag hydroxide-simeth (MAALOX/MYLANTA) 200-200-20 MG/5ML suspension 30 mL  30 mL Oral Q4H PRN Mike Craze, MD      . benztropine (COGENTIN) tablet 0.5 mg  0.5 mg Oral BH-qamhs Randy D Readling, MD      . haloperidol lactate (HALDOL) injection 5 mg  5 mg Intramuscular QHS PRN Ronny Bacon, MD   5 mg at 11/10/11 2124  . LORazepam (ATIVAN) injection 2 mg  2 mg Intramuscular Q4H PRN Mickie D. Adams, PA   2 mg at 11/10/11 2125  . LORazepam (ATIVAN) tablet 2 mg  2 mg Oral Q4H PRN Mickie D. Adams, PA      . magnesium hydroxide (MILK OF MAGNESIA) suspension 30 mL  30 mL Oral Daily PRN Mike Craze, MD      . risperiDONE (RISPERDAL M-TABS) disintegrating tablet 3 mg  3 mg Oral QHS Curlene Labrum Readling, MD   3 mg at 11/09/11 2200   Lab Results: No results found for this or any previous visit (from the past 48 hour(s)).  Time was spent today attempting to connect with the patient in order discuss with the patient the reason for admission.  The patient was angry  and uncooperative and refused to answer any questions.   Considering the severity of the patient's illness and refusal of medications, a forced medication order is indicated.  Treatment Plan Summary:  1. Daily contact with patient to assess and evaluate symptoms and progress in treatment  2. Medication management  3. The patient will deny suicidal ideations or homicidal ideations for 48 hours prior to discharge and have a depression and anxiety rating of 3 or less. The patient will also deny any auditory or visual hallucinations or delusional thinking or display any manic or hypomanic behaviors.  4. The patient will display no evidence of substance withdrawal at time of  discharge.   Plan:  1. The Force Meds order will continue. 2. The patient will be given Risperdal M-tabs 3 mgs po qhs for psychosis and if  she refuses a forced     medication order for Haldol 5 mgs IM should be given. The forced med order is reviewed with Pharm D and is still in place. 3. Laboratory studies reviewed. BMP will be ordered to review for hypokalemia. 4. Will continue to monitor.  5. No changes at this time  Lloyd Huger T. Juwon Scripter PAC 11/11/2011, 1:51 PM

## 2011-11-11 NOTE — Progress Notes (Signed)
Lying in bed with eyes closed.  Safety maintained.  Q 15 minute safety checks are being conducted.

## 2011-11-12 LAB — COMPREHENSIVE METABOLIC PANEL
AST: 28 U/L (ref 0–37)
BUN: 12 mg/dL (ref 6–23)
CO2: 29 mEq/L (ref 19–32)
Calcium: 9.3 mg/dL (ref 8.4–10.5)
Creatinine, Ser: 0.79 mg/dL (ref 0.50–1.10)
GFR calc non Af Amer: >90 mL/min (ref >90)

## 2011-11-12 NOTE — Progress Notes (Signed)
Per State Regulation 482.30  This chart was reviewed for medical necessity with respect to the patient's Admission/Duration of stay.   Next review due:  11/15/11   Ambrose Mantle, LCSW  11/12/2011  1:46 PM

## 2011-11-12 NOTE — Progress Notes (Signed)
Patient ID: [name], female   DOB: January 19, 1961, 51 y.o.   MRN: [id number]   Patient appears to be sleeping. Respirations even and non-labored. Staff will monitor on q 15 minute checks.

## 2011-11-12 NOTE — Progress Notes (Signed)
[facility] MD Progress Note  11/12/2011 4:00 PM  Diagnosis:  Axis I: Schizophrenia - Paranoid Type.  The patient was seen today and again refuses to answer any questions.  The following is based on observation:   Sleep: According to staff, the patient is sleeping reasonably well at night. Appetite: The patient displays a good appetite.   Mild>(1-10) >Severe  Hopelessness (1-10): (Would not answer) Depression (1-10):  (Would not answer) Anxiety (1-10): (Would not answer)     Suicidal Ideation: The patient does not appear to be having suicidal ideations but would not answer questions during interview. Plan: No  Intent: No  Means: No   Homicidal Ideation: The patient does not appear to be having homicidal ideations today.  Plan: No  Intent: No.  Means: No   Eye Contact: Poor.  General Appearance/Behavior: The patient remained angry and uncooperative and continued to refuse to answer questions today. Motor Behavior: Mildly agitated.  Speech: Increased in volume but was appropriate rate with no pressuring.  Mental Status: Alert and Oriented x 3.  Level of Consciousness: Alert.  Mood: Mildly agitated.  Affect: Remains angry.  Anxiety: None noted today.  Thought Process: Psychotic.  Thought Content: The patient appears to be responding to internal stimuli and continues to voice the delusion that her family is dead and have been substituted by imposters (Capgras Syndrome).  Perception: Psychotic.  Judgment: Poor.  Insight: Poor.  Cognition: Orientated to person, place and time.  Sleep:  Number of Hours: 6.75    Vital Signs:Blood pressure 91/60, pulse 81, temperature 98.1 F (36.7 C), temperature source Oral, resp. rate 18, height 5\' 3"  (1.6 m), weight 58.06 kg (128 lb).  Current Medications: Current Facility-Administered Medications  Medication Dose Route Frequency Provider Last Rate Last Dose  . acetaminophen (TYLENOL) tablet 650 mg  650 mg Oral Q6H PRN Mike Craze, MD      .  alum & mag hydroxide-simeth (MAALOX/MYLANTA) 200-200-20 MG/5ML suspension 30 mL  30 mL Oral Q4H PRN Mike Craze, MD      . benztropine (COGENTIN) tablet 0.5 mg  0.5 mg Oral BH-qamhs Carisa Backhaus D Denessa Cavan, MD      . risperiDONE (RISPERDAL M-TABS) disintegrating tablet 3 mg  3 mg Oral QHS Curlene Labrum Junaid Wurzer, MD       Or  . haloperidol lactate (HALDOL) injection 5 mg  5 mg Intramuscular QHS Curlene Labrum Nuha Degner, MD   5 mg at 11/11/11 2348  . LORazepam (ATIVAN) injection 2 mg  2 mg Intramuscular Q4H PRN Mickie D. Adams, PA   2 mg at 11/10/11 2125  . LORazepam (ATIVAN) tablet 2 mg  2 mg Oral Q4H PRN Mickie D. Adams, PA      . magnesium hydroxide (MILK OF MAGNESIA) suspension 30 mL  30 mL Oral Daily PRN Mike Craze, MD       Lab Results:  Results for orders placed during the hospital encounter of 11/06/11 (from the past 48 hour(s))  COMPREHENSIVE METABOLIC PANEL     Status: Abnormal   Collection Time   11/12/11  6:33 AM      Component Value Range Comment   Sodium 141  135 - 145 (mEq/L)    Potassium 3.7  3.5 - 5.1 (mEq/L)    Chloride 105  96 - 112 (mEq/L)    CO2 29  19 - 32 (mEq/L)    Glucose, Bld 101 (*) 70 - 99 (mg/dL)    BUN 12  6 - 23 (mg/dL)  Creatinine, Ser 0.79  0.50 - 1.10 (mg/dL)    Calcium 9.3  8.4 - 10.5 (mg/dL)    Total Protein 7.0  6.0 - 8.3 (g/dL)    Albumin 3.9  3.5 - 5.2 (g/dL)    AST 28  0 - 37 (U/L)    ALT 8  0 - 35 (U/L)    Alkaline Phosphatase 68  39 - 117 (U/L)    Total Bilirubin 0.3  0.3 - 1.2 (mg/dL)    GFR calc non Af Amer >90  >90 (mL/min)    GFR calc Af Amer >90  >90 (mL/min)    Time was spent today attempting to discuss with the patient her ongoing symptoms.  She continued to refuse to answer questions and remained angry and mildly agitated.  She however has not been aggressive in the last 24 hours.  She continues to refuse medications at night but does not resist when forced IM medications are given.  She also attends groups but does not participate.  The patient  displays no insight into her illness.  She does appear to continue to be responding to internal stimuli today with ongoing paranoid delusions.   Treatment Plan Summary:  1. Daily contact with patient to assess and evaluate symptoms and progress in treatment  2. Medication management  3. The patient will deny suicidal ideations or homicidal ideations for 48 hours prior to discharge and have a depression and anxiety rating of 3 or less. The patient will also deny any auditory or visual hallucinations or delusional thinking or display any manic or hypomanic behaviors.  4. The patient will display no evidence of substance withdrawal at time of discharge.   Plan:  1. Will continue Risperdal M-tabs 3 mgs po qhs for psychosis and if she refuses a forced medication order has been approved and Haldol 5 mgs IM should be given. 2. Laboratory studies reviewed.   3. Will continue to monitor.  4. Possible transfer to [facility] tomorrow for further treatment of her psychotic symptoms.  Windsor Goeken 11/12/2011, 4:00 PM

## 2011-11-12 NOTE — Progress Notes (Signed)
Patient has isolated to her room most of the day.  Has come out to a few groups, but does not participate.  Refused her medication this morning (Cogentin).  MD aware.  Continues to be irritable and angry.

## 2011-11-12 NOTE — Discharge Planning (Signed)
Pt was present for discharge planning group this morning.  Pt stated that she was not feeling this morning "just here."  Pt reported that she slept okay last night and she was eating whatever she could digest.  Pt refused to answer further questions. Nadine Ryle Claudette Laws, [place] 11/12/2011  12:16 PM

## 2011-11-12 NOTE — Progress Notes (Signed)
Pt was resting quietly in bed when writer approached for HS medications. She woke and was immediately agitated and angry. She refused PO meds and willingly complied with IM (pulled her sleeve up and said, "Go ahead, you guys are the crazy ones though, thinkin I need this shit."). Refused to answer any assessment questions and pulled her cover over her head. Safety has been maintained with Q15 minute observation. Will continue current POC.

## 2011-11-12 NOTE — Discharge Planning (Signed)
Case Manager has been in communication with Transfer Coordinator April Alexander from [facility]. Re transferring patient to that facility tomorrow.  There will be one discharge from a female bed there tomorrow, and they anticipate running patient by their doctor at that point.  All necessary documentation [facility] been [facility] to [facility]  Ambrose Mantle, LCSW 11/12/2011, 3:34 PM

## 2011-11-13 NOTE — Progress Notes (Signed)
[facility] Group Notes:  (Counselor/Nursing/MHT/Case Management/Adjunct)  11/13/2011 3:20 PM  Type of Therapy:  Group Therapy  Participation Level:  Minimal  Participation Quality:  Attentive and Sharing  Affect:  Blunted, Depressed   Cognitive:  Oriented  Insight:  Limited  Engagement in Group:  Limited  Engagement in Therapy:  Limited  Modes of Intervention:  Clarification, Orientation, Problem-solving and Support  Summary of Progress/Problems:  Therapist introduced herself and the nursing students and assured Pt's of confidentiality.  Therapist prompted Pts to identify what they plan to do after discharge to improve their life.  Pt reported that she was waiting to go home.  She reported that she was a good cook, but did not like to bake.  Therapist explained how the use of drugs could cause brain damage.  Therapist encouraged Pt's not to give up and understand that treatment is a process.  Therapist offered support and encouragement. Minimal Progress noted.    Marni Griffon C 11/13/2011, 3:20 PM

## 2011-11-13 NOTE — Discharge Summary (Signed)
Physician Discharge Summary Note  Patient:  [name] is an 51 y.o., female MRN:  [id number] DOB:  1960/12/26 Patient phone:  [phone] (home)  Patient address:   [address] [place] [address],   Date of Admission:  11/06/2011 Date of Discharge: 11/13/2011  Reason for Admission: Paranoid Schizophrenia with medical non compliance   Discharge Diagnoses: Principal Problem:  *Schizophrenia, paranoid Active Problems:  Medically noncompliant  Hypokalemic alkalosis   Axis Diagnosis:   AXIS I:  Schizophrenia, paranoid type AXIS II:  Deferred AXIS III:  1. No Acute Illnesses Noted. AXIS IV:  problems with primary support group AXIS V:  GAF at time of admission approximately 35.  GAF at time of transfer approximately 40.  Level of Care:  Long-term IP psych.  Hospital Course:  Foy was admitted to this hospital after her family contacted mobile crisis due to her weight loss, non compliance and inability to care for herself.  Her ADLs had deteriorated and was referred by [facility] to [facility] and was sent to [facility] on IVC.  During her admission she has been isolative, uncooperative, and resistant to medication.  Danett has been on forced medication order since shortly after her arrival.  Her mental status has not improved.  She continues to respond to internal stimulation, is angry and hostile toward the staff and other patients. She remains disorganized in her thought process, but denies SI or HI. She states " I don't give a damn!" when asked about her life and plans for the future. She refuses to answer most questions and her participation in unit programming has been minimal.  Syniyah does tolerate the injectable medications but it is difficult to get her to cooperate. Her appetite is poor and she has to be encouraged to eat.    Consults:  None  Significant Diagnostic Studies:  None  Discharge Vitals:   Blood pressure 107/72, pulse 58, temperature 98 F (36.7 C), temperature source  Oral, resp. rate 18, height 5\' 3"  (1.6 m), weight 58.06 kg (128 lb).  Mental Status Exam: See Mental Status Examination and Suicide Risk Assessment completed by Attending Physician prior to discharge.  Discharge destination:  Other:  [facility].  Is patient on multiple antipsychotic therapies at discharge:  No   Has Patient had three or more failed trials of antipsychotic monotherapy by history:  No  Recommended Plan for Multiple Antipsychotic Therapies: Not applicable   Discharge Medications:   1.  acetaminophen (TYLENOL) tablet 650 mg po Q6H PRN - Pain.  2.  alum & mag hydroxide-simeth (MAALOX/MYLANTA) 200-200-20 MG/5ML suspension 30 mL Q4H PRN - Heartburn.   3.  benztropine (COGENTIN) tablet 0.5 mg po qam and hs - EPS.    4.  risperiDONE (RISPERDAL M-TABS) disintegrating tablet 3 mg po qhs for psychosis OR  5.  haloperidol lactate (HALDOL) injection 5 mg IM QHS - for psychosis if the patient refuses po Risperdal.  6.  LORazepam (ATIVAN) injection 2 mg IM or 2 mg po q 4 hours - prn for agitation and or aggressive behaviors.  7.  magnesium hydroxide (MILK OF MAGNESIA) suspension 30 mL Daily PRN for constipation.   Discharge Orders    Future Orders Please Complete By Expires   Diet - low sodium heart healthy      Increase activity slowly      Discharge instructions      Comments:   Current medication list has already been faxed to your hospital.   Medication List  You have not been given and prescriptions or samples of medications at discharge.     Follow-up Information    Please follow up. (APPOINTMENT NEEDED - PLEASE DO NOT DISCHARGE WITHOUT THIS)   Follow-up recommendations:  Activity:  as tolerated Diet:  Heart healthy diet Other:  exercise daily is recommended.  Comments:  Pt. Is being transferred to the [facility] as requested by her guardian.   Signed: Rona Ravens. [place] PAC for  Dr. Harvie Heck D. Liliah Dorian 11/13/2011, 3:23 PM

## 2011-11-13 NOTE — Tx Team (Signed)
Interdisciplinary Treatment Plan Update (Adult)  Date:  11/13/2011  Time Reviewed:  10:15AM-11:15AM  Progress in Treatment: Attending groups:  Yes Participating in groups:    No Taking medication as prescribed:    No, but is accepting nightly injections from forced meds without actually being forced Tolerating medication:   Yes Family/Significant other contact made:  Yes, with sister who is guardian Patient understands diagnosis:   No Discussing patient identified problems/goals with staff:   No Medical problems stabilized or resolved:   Yes Denies suicidal/homicidal ideation:  Will not respond Issues/concerns per patient self-inventory:   None Other:    New problem(s) identified: Yes, Describe:  [facility] [place], but a bed has not yet vacated for her.  Reason for Continuation of Hospitalization: Aggression Depression Hallucinations Medication stabilization  Interventions implemented related to continuation of hospitalization:  Medication monitoring and adjustment, safety checks Q15 min., suicide risk assessment, group therapy, psychoeducation, collateral contact, aftercare planning, ongoing physician assessments, medication education  Additional comments:  Not applicable  Estimated length of stay:  7 days  Discharge Plan:  Hopefully to [facility], then home with mother, follow up with [facility].  New goal(s):  Not applicable  Review of initial/current patient goals per problem list:   1.  Goal(s):  Reduce auditory visual hallucinations to baseline per guardian, family, and self report.  Met:  No  Target date:  By Discharge   As evidenced by:  Still responding to internal stimuli  2.  Goal(s):  Determine if & how to address weight loss, not eating properly while in the hospital.  Met:  No  Target date:  By Discharge   As evidenced by:  Seems to be eating better, however.  3.  Goal(s):  Determine whether it is  possible to transfer to [facility]  Met:  Yes  Target date:  By Discharge   As evidenced by:  Patient accepted to the [facility] but we are awaiting a bed  4.  Goal(s):  Reestablish medication regimen  Met:  Yes  Target date:  By Discharge   As evidenced by:  Back on meds, although it is a forced med order.  She continually refuses the oral, and says it has to be forced, then shows her arm willingly for the injection.  Attendees: Patient:  Did not attend   Family:     Physician:  Dr. Harvie Heck Readling 11/13/2011 10:15AM-11:15AM  Nursing:   Waynetta Sandy, RN 11/13/2011 10:15AM -11:15AM   Case Manager:  Ambrose Mantle, LCSW 11/13/2011 10:15AM-11:15AM  Counselor:  Veto Kemps, MT-BC 11/13/2011 10:15AM-11:15AM  Other:   Verne Spurr, PA 11/13/2011 10:15AM-11:15AM  Other:      Other:      Other:       Scribe for Treatment Team:   Sarina Ser, 11/13/2011, 10:15AM-11:15AM

## 2011-11-13 NOTE — Discharge Planning (Signed)
Met with patient in Aftercare Planning Group.   She did not have any case management needs today, but was not angry and yelling about being asked, either.  During Aftercare Planning Group, Case Manager provided psychoeducation on "Suicide Prevention Information."  This included descriptions of risk factors for suicide, warning signs that an individual is in crisis and thinking of suicide, and what to do if this occurs.  Patient did not participate at all, but was noted to be nodding her head several times during discussion.  Case Manager worked with [facility] [place] and finalized patient's acceptance to that facility.  Worked with Therapist, occupational and [facility] to get appropriate transportation in place.  Called guardian and told her of the upcoming transfer.  Transport is expected sometime between 6-7pm today.  Ambrose Mantle, LCSW 11/13/2011, 3:27 PM

## 2011-11-13 NOTE — Progress Notes (Signed)
Pt denies SI/HI/AVH. Pt given discharge instructions. All belongings returned to pt from [facility]. Pt compliant. No distress noted. Pt being transported to [facility] hospital by way of sheriff.

## 2011-11-13 NOTE — Progress Notes (Signed)
[facility] Group Notes:  (Counselor/Nursing/MHT/Case Management/Adjunct)  11/13/2011 8:17 AM  Type of Therapy:  Group Therapy 11/12/11  Participation Level:  None  Present but did not participate   Nikoli Nasser, Aram Beecham 11/13/2011, 8:17 AM

## 2011-11-13 NOTE — Progress Notes (Signed)
Report called to Amy at [facility]. [phone] ext [phone]

## 2011-11-13 NOTE — [facility] Suicide Risk Assessment (Signed)
Suicide Risk Assessment  Discharge Assessment     Demographic factors:  Low socioeconomic status  Current Mental Status Per Nursing Assessment::   On Admission:   At Discharge:  AO to person and place.  The patient continues to refuse to answer questions.  Current Mental Status Per Physician:  Diagnosis:  Axis I: Schizophrenia - Paranoid Type.  The patient was seen today and again refuses to answer most questions.  She did answer about sleep and appetite and then would not answer others:   Sleep: The patient reports to sleeping reasonably well. Appetite: The patient reports a good appetite.   Mild>(1-10) >Severe  Hopelessness (1-10): (Would not answer) Depression (1-10):  (Would not answer) Anxiety (1-10): (Would not answer)     Suicidal Ideation: The patient does not appear to be having suicidal ideations but would not answer questions during interview. Plan: No  Intent: No  Means: No   Homicidal Ideation: The patient does not appear to be having homicidal ideations today.  Plan: No  Intent: No.  Means: No   Eye Contact: Poor.  General Appearance/Behavior: The patient remained angry and uncooperative and continued to refuse to answer most questions today. However, the intensity of her anger appears to be improving slightly. Motor Behavior: Mildly agitated.  Speech: Appropriate volume and rate with no pressuring.  Mental Status: Alert and Oriented x 3.  Level of Consciousness: Alert.  Mood: Mildly agitated.  Affect: Remains angry.  Anxiety: None noted today.  Thought Process: Psychotic.  Thought Content: The patient appears to be responding to internal stimuli and continues to voice the delusion that her family is dead and have been substituted by imposters (Capgras Syndrome).  Perception: Psychotic.  Judgment: Poor.  Insight: Poor.  Cognition: Orientated to person, place and time.  Sleep:  Number of Hours: 6.75    Vital Signs:Blood pressure 107/72, pulse 58,  temperature 98 F (36.7 C), temperature source Oral, resp. rate 18, height 5\' 3"  (1.6 m), weight 58.06 kg (128 lb).  Current Medications: Current Facility-Administered Medications  Medication Dose Route Frequency Provider Last Rate Last Dose  . acetaminophen (TYLENOL) tablet 650 mg  650 mg Oral Q6H PRN Mike Craze, MD      . alum & mag hydroxide-simeth (MAALOX/MYLANTA) 200-200-20 MG/5ML suspension 30 mL  30 mL Oral Q4H PRN Mike Craze, MD      . benztropine (COGENTIN) tablet 0.5 mg  0.5 mg Oral BH-qamhs Gilmore List D Kingjames Coury, MD      . risperiDONE (RISPERDAL M-TABS) disintegrating tablet 3 mg  3 mg Oral QHS Curlene Labrum Kiwan Gadsden, MD       Or  . haloperidol lactate (HALDOL) injection 5 mg  5 mg Intramuscular QHS Curlene Labrum Colbert Curenton, MD   5 mg at 11/12/11 2200  . LORazepam (ATIVAN) injection 2 mg  2 mg Intramuscular Q4H PRN Mickie D. Adams, PA   2 mg at 11/10/11 2125  . LORazepam (ATIVAN) tablet 2 mg  2 mg Oral Q4H PRN Mickie D. Adams, PA      . magnesium hydroxide (MILK OF MAGNESIA) suspension 30 mL  30 mL Oral Daily PRN Mike Craze, MD       Lab Results:  Results for orders placed during the hospital encounter of 11/06/11 (from the past 48 hour(s))  COMPREHENSIVE METABOLIC PANEL     Status: Abnormal   Collection Time   11/12/11  6:33 AM      Component Value Range Comment   Sodium 141  135 -  145 (mEq/L)    Potassium 3.7  3.5 - 5.1 (mEq/L)    Chloride 105  96 - 112 (mEq/L)    CO2 29  19 - 32 (mEq/L)    Glucose, Bld 101 (*) 70 - 99 (mg/dL)    BUN 12  6 - 23 (mg/dL)    Creatinine, Ser 6.21  0.50 - 1.10 (mg/dL)    Calcium 9.3  8.4 - 10.5 (mg/dL)    Total Protein 7.0  6.0 - 8.3 (g/dL)    Albumin 3.9  3.5 - 5.2 (g/dL)    AST 28  0 - 37 (U/L)    ALT 8  0 - 35 (U/L)    Alkaline Phosphatase 68  39 - 117 (U/L)    Total Bilirubin 0.3  0.3 - 1.2 (mg/dL)    GFR calc non Af Amer >90  >90 (mL/min)    GFR calc Af Amer >90  >90 (mL/min)    Loss Factors: Financial problems / change in socioeconomic  status  Historical Factors: Long History of Chronic Mental Illness.  History of non-compliance with medications.  The patient has been declared legally incompetent.  Risk Reduction Factors:   The patient has a Armed forces operational officer Guardian which is her sister.  Continued Clinical Symptoms:  Schizophrenia:   Paranoid or undifferentiated type Currently Psychotic Previous Psychiatric Diagnoses and Treatments  Discharge Diagnoses:   AXIS I:   Schizophrenia - Paranoid Type. AXIS II:   Deferred. AXIS III:   1.  No Acute Illnesses. AXIS IV:   Chronic Mental Illness.  Non-compliance with medications.   AXIS V:   GAF at time of Admission approximately 30.  GAF at time of Discharge approximately 35.  Cognitive Features That Contribute To Risk:  Loss of executive function    Time was spent today attempting to discuss with the patient her ongoing symptoms.  She continued to refuse to answer most questions and remained angry and mildly agitated.  She however has not been aggressive.  She continues to refuse medications at night but does not resist when forced IM medications are given.  She does appear to continue to be responding to internal stimuli today with ongoing paranoid delusions. The patient did state that she is sleeping reasonably well with a good appetite.  She also participated some in groups today. The Case Manager is in the process of arranging transfer to the [facility] in [place], [place].  Treatment Plan Summary:  1. Daily contact with patient to assess and evaluate symptoms and progress in treatment  2. Medication management  3. The patient will deny suicidal ideations or homicidal ideations for 48 hours prior to discharge and have a depression and anxiety rating of 3 or less. The patient will also deny any auditory or visual hallucinations or delusional thinking or display any manic or hypomanic behaviors.  4. The patient will display no evidence of substance withdrawal at time of  discharge.   Plan:  1. Will continue Risperdal M-tabs 3 mgs po qhs for psychosis and if she refuses a forced medication order has been approved and Haldol 5 mgs IM should be given. 2. Laboratory studies reviewed.   3. Will continue to monitor.  4. Transfer to [facility] today for further treatment of her psychotic symptoms.  Suicide Risk:  Moderate:  Frequent suicidal ideation with limited intensity, and duration, some specificity in terms of plans, no associated intent, good self-control, limited dysphoria/symptomatology, some risk factors present, and identifiable protective factors, including available  and accessible social support.  Plan Of Care/Follow-up recommendations:  Activity:  As tolerated. Diet:  Heart Healthy Diet. Other:  Please take all medications as prescribed and keep all scheduled follow up appointments as arranged at time of discharge from the [place].  Vennessa Affinito 11/13/2011, 2:08 PM

## 2011-11-13 NOTE — Progress Notes (Signed)
[facility] Group Notes:  (Counselor/Nursing/MHT/Case Management/Adjunct)  11/13/2011 8:18 AM  Type of Therapy:  Music Therapy 11/12/11  Participation Level:  None  Present but did not participate.  [name] 11/13/2011, 8:18 AM

## 2011-11-13 NOTE — Progress Notes (Signed)
[facility] Case Management Discharge Plan:  Will you be returning to the same living situation after discharge: No.  Transferring to another facility At discharge, do you have transportation home?:Yes,  [place] Sheriff Do you have the ability to pay for your medications:Yes,  insurance and income  Interagency Information:     Release of information consent forms completed and in the chart;  Patient's signature needed at discharge.  Patient to Follow up at:  Follow-up Information    Follow up with [facility] on 11/13/2011. (Transfer has been arranged via Rise Paganini)    Contact information:   [address] [place] [phone]         Patient denies SI/HI:   No.  Will not respond    Safety Planning and Suicide Prevention discussed:  Yes,  Patient nodded during discussion but did not participate or verbally acknowledge  Barrier to discharge identified:Yes,  patient is not stable for discharge, is going to [facility] where she is known for continued stabilization  Summary and Recommendations:  Transfer to [facility] for continued stabilization.   Sarina Ser 11/13/2011, 3:29 PM

## 2011-11-13 NOTE — Progress Notes (Signed)
[facility] Group Notes:  (Counselor/Nursing/MHT/Case Management/Adjunct)  11/13/2011 8:16 AM  Type of Therapy:  Group Therapy 11/11/11  Participation Level:  None  Patient did not want to participate and stated "I'm not cooperating". Ameilia Rattan, Aram Beecham 11/13/2011, 8:16 AM

## 2011-11-13 NOTE — Progress Notes (Signed)
Pt is isolative and irritable on unit. When writer asked pt about si, she reports I hear this everyday as she walks away. Pt continues to be unapproachable. Offered support and 15 minute checks. Safety maintained on unit.

## 2011-11-17 NOTE — Progress Notes (Signed)
Patient Discharge Instructions:  Psychiatric Admission Assessment Note Provided,  11/17/2011 After Visit Summary (AVS) Provided,  11/17/2011 Face Sheet Provided, 11/17/2011 Faxed/Sent to the Next Level Care provider:  11/17/2011 Provided Suicide Risk Assessment - Discharge Assessment 11/17/2011  Faxed to [facility] @ [phone]  Wandra Scot, 11/17/2011, 5:41 PM

## 2013-04-07 ENCOUNTER — Encounter (HOSPITAL_COMMUNITY): Payer: Self-pay | Admitting: Emergency Medicine

## 2013-04-07 ENCOUNTER — Emergency Department (HOSPITAL_COMMUNITY)
Admission: EM | Admit: 2013-04-07 | Discharge: 2013-04-18 | Disposition: A | Discharge status: Other Acute Inpatient Hospital | Payer: Medicare Other | Attending: Emergency Medicine | Admitting: Emergency Medicine

## 2013-04-07 DIAGNOSIS — Z9119 Patient's noncompliance with other medical treatment and regimen: Secondary | ICD-10-CM | POA: Diagnosis not present

## 2013-04-07 DIAGNOSIS — F29 Unspecified psychosis not due to a substance or known physiological condition: Secondary | ICD-10-CM | POA: Diagnosis not present

## 2013-04-07 DIAGNOSIS — F200 Paranoid schizophrenia: Secondary | ICD-10-CM

## 2013-04-07 DIAGNOSIS — Z3202 Encounter for pregnancy test, result negative: Secondary | ICD-10-CM | POA: Diagnosis not present

## 2013-04-07 DIAGNOSIS — IMO0002 Reserved for concepts with insufficient information to code with codable children: Secondary | ICD-10-CM | POA: Insufficient documentation

## 2013-04-07 DIAGNOSIS — F209 Schizophrenia, unspecified: Secondary | ICD-10-CM

## 2013-04-07 DIAGNOSIS — F17200 Nicotine dependence, unspecified, uncomplicated: Secondary | ICD-10-CM | POA: Diagnosis not present

## 2013-04-07 DIAGNOSIS — F411 Generalized anxiety disorder: Secondary | ICD-10-CM | POA: Insufficient documentation

## 2013-04-07 DIAGNOSIS — Z91199 Patient's noncompliance with other medical treatment and regimen due to unspecified reason: Secondary | ICD-10-CM | POA: Insufficient documentation

## 2013-04-07 LAB — BASIC METABOLIC PANEL
BUN: 10 mg/dL (ref 6–23)
CO2: 26 mEq/L (ref 19–32)
Chloride: 98 mEq/L (ref 96–112)
Glucose, Bld: 112 mg/dL — ABNORMAL HIGH (ref 70–99)
Potassium: 3.7 mEq/L (ref 3.5–5.1)
Sodium: 138 mEq/L (ref 135–145)

## 2013-04-07 LAB — RAPID URINE DRUG SCREEN, HOSP PERFORMED
Amphetamines: NOT DETECTED
Barbiturates: NOT DETECTED
Benzodiazepines: NOT DETECTED
Cocaine: NOT DETECTED
Opiates: NOT DETECTED
Tetrahydrocannabinol: NOT DETECTED

## 2013-04-07 LAB — CBC WITH DIFFERENTIAL/PLATELET
Eosinophils Absolute: 0 10*3/uL (ref 0.0–0.7)
Hemoglobin: 14 g/dL (ref 12.0–15.0)
Lymphocytes Relative: 23 % (ref 12–46)
Lymphs Abs: 1.7 10*3/uL (ref 0.7–4.0)
MCH: 28.1 pg (ref 26.0–34.0)
Monocytes Relative: 7 % (ref 3–12)
Neutro Abs: 5.2 10*3/uL (ref 1.7–7.7)
Neutrophils Relative %: 70 % (ref 43–77)
RBC: 4.98 MIL/uL (ref 3.87–5.11)
WBC: 7.5 10*3/uL (ref 4.0–10.5)

## 2013-04-07 MED ORDER — ONDANSETRON HCL 4 MG PO TABS: 4.0000 mg | ORAL_TABLET | Freq: Three times a day (TID) | ORAL | Status: DC | PRN

## 2013-04-07 MED ORDER — ACETAMINOPHEN 325 MG PO TABS: 650.0000 mg | ORAL_TABLET | ORAL | Status: DC | PRN

## 2013-04-07 MED ORDER — ALUM & MAG HYDROXIDE-SIMETH 200-200-20 MG/5ML PO SUSP: 30.0000 mL | ORAL | Status: DC | PRN

## 2013-04-07 MED ORDER — IBUPROFEN 200 MG PO TABS: 600.0000 mg | ORAL_TABLET | Freq: Three times a day (TID) | ORAL | Status: DC | PRN

## 2013-04-07 MED ORDER — NICOTINE 21 MG/24HR TD PT24: 21.0000 mg | MEDICATED_PATCH | Freq: Every day | TRANSDERMAL | Status: DC

## 2013-04-07 MED ORDER — ZOLPIDEM TARTRATE 5 MG PO TABS: 5.0000 mg | ORAL_TABLET | Freq: Every evening | ORAL | Status: DC | PRN

## 2013-04-07 NOTE — ED Notes (Signed)
Belongings handed off to [facility] in [place] ED

## 2013-04-07 NOTE — ED Provider Notes (Signed)
CSN: [id number]     Arrival date & time 04/07/13  1815 History  This chart was scribed for non-physician practitioner Marlon Pel, PA-C working with Hurman Horn, MD by Joaquin Music, ED Scribe. This patient was seen in room WA22/WA22 and the patient's care was started at 6:36 PM      Chief Complaint  Patient presents with  . Medical Clearance   The history is provided by the patient and the police (Mobile Crisis: IIVC papers; [facility]). The history is limited by the condition of the patient. No language interpreter was used.   LEVEL 5 Caveat (Schizophrenia and Agitation)  HPI Comments: [name] is a 52 y.o. Female with a history of schizophrenia brought by [employer] from [facility] to the Emergency Department with IVC papers due to medication non-compliance. Per PA, pt is uncooperative, agitated and not able to provide history. Pt states she is being force to take her schizophrenia medications by an "illegal network", and that she is not compliant with her medications because of forced compliance. Per [place], pt has not been eating and been combative. Per [employer], pt has been compliant. Per IVC papers, pt is a danger to herself and others. Per IVC papers also, pt has previously been hospitalized numerous times.  Past Medical History  Diagnosis Date  . Schizophrenia   . Non compliance w medication regimen    History reviewed. No pertinent past surgical history. No family history on file. History  Substance Use Topics  . Smoking status: Current Every Day Smoker  . Smokeless tobacco: Not on file  . Alcohol Use: Yes   OB History   Grav Para Term Preterm Abortions TAB SAB Ect Mult Living                 Review of Systems Review of Systems: uncooperative  LEVEL 5 CAVEAT- Pt agitated and unable to obtain ROS.  Allergies  Aspirin  Home Medications  No current outpatient prescriptions on file.  BP 120/62  Pulse 63  Temp(Src) 98.4 F (36.9 C) (Oral)  Resp 14  SpO2  97%  Physical Exam  Nursing note and vitals reviewed. Constitutional: She appears well-developed and well-nourished. No distress.  HENT:  Head: Normocephalic and atraumatic.  Eyes: Pupils are equal, round, and reactive to light.  Neck: Normal range of motion. Neck supple.  Cardiovascular: Normal rate and regular rhythm.   Pulmonary/Chest: Effort normal.  Abdominal: Soft.  Neurological: She is alert.  Skin: Skin is warm and dry.  Psychiatric: Her mood appears anxious. She is agitated.    ED Course  Procedures  DIAGNOSTIC STUDIES: Oxygen Saturation is 97% on RA, normal by my interpretation.    COORDINATION OF CARE: 6:39 PM- Spoke with Dr. Fonnie Jarvis, who will evaluate pt and will do first physician examination for IVC patient. Pt will be held in exam room until TTS assessment and Dr. Clydell Hakim examination.   Labs Review Labs Reviewed  CBC WITH DIFFERENTIAL  BASIC METABOLIC PANEL  ETHANOL  URINE RAPID DRUG SCREEN (HOSP PERFORMED)  PREGNANCY, URINE   Imaging Review No results found.  MDM   1. Schizophrenia   2. Psychosis    Screening labs ordered. Waiting pharmacy med rec Holding orders placed. Dr. Fonnie Jarvis to see patient.  Dorthula Matas, PA-C 04/07/13 1908

## 2013-04-07 NOTE — ED Provider Notes (Signed)
Medical screening examination/treatment/procedure(s) were conducted as a shared visit with non-physician practitioner(s) and myself.  I personally evaluated the patient during the encounter  IVC, noncompliant schizophrenic, anxious agitated paranoid, delusional, thinks medications are being released through the ventilation system at [facility], thinks the government is out to get her, denies any threats to harm herself or others, came here with petition completed and I completed the Fiirst Examination form.  Hurman Horn, MD 04/08/13 [phone]

## 2013-04-07 NOTE — ED Notes (Signed)
TANIKA RN  WILL TAKE CARE OF TELE PSYCH ORDER

## 2013-04-07 NOTE — ED Notes (Signed)
Pt asleep; no s/s of distress noted. Respirations regular and unlabored. 

## 2013-04-07 NOTE — ED Notes (Signed)
IVC PAPERS 

## 2013-04-07 NOTE — ED Notes (Signed)
Per pt, states she went to [facility] yesterday because of a forced complaint-IVC'ed-per [facility], states she is not eating and psychotic-states she has not had any meds and they have been forcing the wrong meds on her

## 2013-04-07 NOTE — ED Notes (Signed)
Pt. and belongings wanded by security 

## 2013-04-07 NOTE — Consult Note (Signed)
[facility] Face-to-Face Psychiatry Consult   Reason for Consult:  ED Referral IVC [facility] Referring Physician:  ED providers [name] is an 52 y.o. female.  Assessment: AXIS I:  Chronic Paranoid Schizophrenia acute remission;Noncompliance with meds AXIS II:  Deferred AXIS III:   Past Medical History  Diagnosis Date  . Schizophrenia   . Non compliance w medication regimen    AXIS IV:  housing problems AXIS V:  21-30 behavior considerably influenced by delusions or hallucinations OR serious impairment in judgment, communication OR inability to function in almost all areas  Plan:  Recommend [facility] placement due to aggression and noncompliance  Subjective:   [name] is a 52 y.o. female patient admitted with acute remission her chronic paranoid schizophrenia due to noncompliance with meds.This is a recurring pattern.Marland Kitchen  HPI:  As above.Also see ED provider note HPI Elements:   Severity:  as above.  Past Psychiatric History: Past Medical History  Diagnosis Date  . Schizophrenia   . Non compliance w medication regimen     reports that she has been smoking.  She does not have any smokeless tobacco history on file. She reports that  drinks alcohol. She reports that she does not use illicit drugs. No family history on file.         Allergies:   Allergies  Allergen Reactions  . Aspirin     ACT Assessment Complete:  No:   Past Psychiatric History: Diagnosis:  Acute psychosis chronic paranoid schizophrenia  Hospitalizations:  Numerous  Outpatient Care:  [facility]  Substance Abuse Care:  UDS negative/negative history  Self-Mutilation:  None  Suicidal Attempts:  None reported  Homicidal Behaviors:  None reported   Violent Behaviors:  Reported to be aggressive   Place of Residence: [place] facility Marital Status:  Single Employed/Unemployed:  Disabled Education:  NA Family Supports:  ? Objective: Blood pressure 133/72, pulse 58, temperature 97.8 F (36.6 C),  temperature source Oral, resp. rate 18, SpO2 100.00%.There is no weight on file to calculate BMI. Results for orders placed during the hospital encounter of 04/07/13 (from the past 72 hour(s))  CBC WITH DIFFERENTIAL     Status: None   Collection Time    04/07/13  6:43 PM      Result Value Range   WBC 7.5  4.0 - 10.5 K/uL   RBC 4.98  3.87 - 5.11 MIL/uL   Hemoglobin 14.0  12.0 - 15.0 g/dL   HCT 16.1  09.6 - 04.5 %   MCV 83.7  78.0 - 100.0 fL   MCH 28.1  26.0 - 34.0 pg   MCHC 33.6  30.0 - 36.0 g/dL   RDW 40.9  81.1 - 91.4 %   Platelets 276  150 - 400 K/uL   Neutrophils Relative % 70  43 - 77 %   Neutro Abs 5.2  1.7 - 7.7 K/uL   Lymphocytes Relative 23  12 - 46 %   Lymphs Abs 1.7  0.7 - 4.0 K/uL   Monocytes Relative 7  3 - 12 %   Monocytes Absolute 0.5  0.1 - 1.0 K/uL   Eosinophils Relative 0  0 - 5 %   Eosinophils Absolute 0.0  0.0 - 0.7 K/uL   Basophils Relative 0  0 - 1 %   Basophils Absolute 0.0  0.0 - 0.1 K/uL  BASIC METABOLIC PANEL     Status: Abnormal   Collection Time    04/07/13  6:43 PM      Result Value  Range   Sodium 138  135 - 145 mEq/L   Potassium 3.7  3.5 - 5.1 mEq/L   Chloride 98  96 - 112 mEq/L   CO2 26  19 - 32 mEq/L   Glucose, Bld 112 (*) 70 - 99 mg/dL   BUN 10  6 - 23 mg/dL   Creatinine, Ser 4.54  0.50 - 1.10 mg/dL   Calcium 9.7  8.4 - 09.8 mg/dL   GFR calc non Af Amer >90  >90 mL/min   GFR calc Af Amer >90  >90 mL/min   Comment: (NOTE)     The eGFR has been calculated using the CKD EPI equation.     This calculation has not been validated in all clinical situations.     eGFR's persistently <90 mL/min signify possible Chronic Kidney     Disease.  ETHANOL     Status: None   Collection Time    04/07/13  6:43 PM      Result Value Range   Alcohol, Ethyl (B) <11  0 - 11 mg/dL   Comment:            LOWEST DETECTABLE LIMIT FOR     SERUM ALCOHOL IS 11 mg/dL     FOR MEDICAL PURPOSES ONLY  URINE RAPID DRUG SCREEN (HOSP PERFORMED)     Status: None    Collection Time    04/07/13  6:43 PM      Result Value Range   Opiates NONE DETECTED  NONE DETECTED   Cocaine NONE DETECTED  NONE DETECTED   Benzodiazepines NONE DETECTED  NONE DETECTED   Amphetamines NONE DETECTED  NONE DETECTED   Tetrahydrocannabinol NONE DETECTED  NONE DETECTED   Barbiturates NONE DETECTED  NONE DETECTED   Comment:            DRUG SCREEN FOR MEDICAL PURPOSES     ONLY.  IF CONFIRMATION IS NEEDED     FOR ANY PURPOSE, NOTIFY LAB     WITHIN 5 DAYS.                LOWEST DETECTABLE LIMITS     FOR URINE DRUG SCREEN     Drug Class       Cutoff (ng/mL)     Amphetamine      1000     Barbiturate      200     Benzodiazepine   200     Tricyclics       300     Opiates          300     Cocaine          300     THC              50  PREGNANCY, URINE     Status: None   Collection Time    04/07/13  6:43 PM      Result Value Range   Preg Test, Ur NEGATIVE  NEGATIVE   Comment:            THE SENSITIVITY OF THIS     METHODOLOGY IS >20 mIU/mL.   Labs are reviewed and are pertinent for essentially negative  Current Facility-Administered Medications  Medication Dose Route Frequency Provider Last Rate Last Dose  . acetaminophen (TYLENOL) tablet 650 mg  650 mg Oral Q4H PRN Dorthula Matas, PA-C      . alum & mag hydroxide-simeth (MAALOX/MYLANTA) 200-200-20 MG/5ML suspension 30 mL  30 mL Oral PRN  Dorthula Matas, PA-C      . nicotine (NICODERM CQ - dosed in mg/24 hours) patch 21 mg  21 mg Transdermal Daily Tiffany Irine Seal, PA-C      . ondansetron (ZOFRAN) tablet 4 mg  4 mg Oral Q8H PRN Dorthula Matas, PA-C      . zolpidem (AMBIEN) tablet 5 mg  5 mg Oral QHS PRN Dorthula Matas, PA-C       No current outpatient prescriptions on file.    Psychiatric Specialty Exam:     Blood pressure 133/72, pulse 58, temperature 97.8 F (36.6 C), temperature source Oral, resp. rate 18, SpO2 100.00%.There is no weight on file to calculate BMI.  General Appearance: Disheveled  Eye  Contact::  Absent  Speech:  Pressured  Volume:  Increased  Mood:  Irritable  Affect:  Congruent  Thought Process:  Disorganized  Orientation:  To person  Thought Content:  Ideas of Reference:   Paranoia  Suicidal Thoughts:  No  Homicidal Thoughts:  No  Memory:  NA  Judgement:  Impaired  Insight:  Lacking  Psychomotor Activity:  EPS  Concentration:  Poor  Recall:  Poor  Akathisia:  NA  Handed:  Right  AIMS (if indicated):     Assets:  Others:  lacking  Sleep:      Treatment Plan Summary: Recommend referral to state facility/pts medication records are not present.Pharmacy has been consulted.She is reportedly noncompliant.Reassess SAT AM  Doyal Saric E 04/07/2013 11:16 PM

## 2013-04-07 NOTE — ED Notes (Addendum)
Pt Belongings: 1 BAG -  Placed at nurses station across from room 17  - Jeans - Red Tee - White socks - White sports style bra - Grey hair pick - green bic lighter - keys on leather buckle - black mens wallet  - Black high top style sneakers

## 2013-04-08 NOTE — Consult Note (Signed)
  Patient Identification:  [name] Date of Evaluation:  04/08/2013   History of Present Illness:  Patient was brought in from [facility] under IVC  for medication noncompliance and refusing to eat.  It was reported patient was becoming combative at home.  Here she is calm but still refuses to answer questions and stated she does not need any medication.  She stated she refused to eat food at [facility] because the food there is poisoned.  Patient promises this Clinical research associate that she will eat the food from here.  We will continue to provide safety while we wait for bed at [facility].  Past Psychiatric History:  Paranoid Schizophrenia   Past Medical History:     Past Medical History  Diagnosis Date  . Schizophrenia   . Non compliance w medication regimen       History reviewed. No pertinent past surgical history.  Allergies:  Allergies  Allergen Reactions  . Aspirin     Current Medications:  Prior to Admission medications   Not on File    Social History:    reports that she has been smoking.  She does not have any smokeless tobacco history on file. She reports that  drinks alcohol. She reports that she does not use illicit drugs.   Family History:    No family history on file.  Mental Status Examination/Evaluation:  Patient refused to answer questions or participate in the interview stating she does not belong here   DIAGNOSIS:   AXIS I   Medication noncompliance, Paranoid Schizophrenia  AXIS II  Deffered  AXIS III See medical notes.  AXIS IV other psychosocial or environmental problems and problems related to social environment  AXIS V 21-30 behavior considerably influenced by delusions or hallucinations OR serious impairment in judgment, communication OR inability to function in almost all areas     Assessment/Plan:  Consult and face to face interview with Dr Gilmore Laroche We will continue with our plan of care which is [facility] referral We will continue to offer patient medications despite  her refusal of medications  I agreed with the findings, treatment and disposition plan of this patient. Thresa Ross,  MD

## 2013-04-08 NOTE — BH Assessment (Addendum)
[facility] Assessment Progress Note Update:  [name] and per [facility] @ 1043, beds available.  Referral faxed for review.  [facility] and spoke with Robin @ 1038, who stated pt referral not received.  She told me to fax to another number - [phone] - for review.  TTS to follow up with referral.

## 2013-04-08 NOTE — Clinical Social Work Note (Signed)
CSW completed [facility] referral and faxed to [name] at this time [phone]).  Referral number received from [facility] is [id number].  CSW spoke with Dewayne Hatch in admission and completed the verbal screening [phone]).  Pt is now on the waiting list.    [name], LCSWA 04/08/2013  8:47 PM

## 2013-04-08 NOTE — BH Assessment (Signed)
[facility] declined Pt due to her acuity.  Harlin Rain Ria Comment, [facility] Triage Specialist

## 2013-04-08 NOTE — BH Assessment (Signed)
Maryjean Morn, PA declined Pt at [facility] due to her acuity. Contacted the following facilities for placement:  [facility]: At capacity [facility]: At capacity [facility]: At capacity [facility]: At [facility]: At capacity [facility]: At capacity [facility]: At capacity [facility]: At [facility]: [facility] available. Faxed clinical information [facility]: Beds available. Faxed clinical information   Harlin Rain Ria Comment, [facility] Triage Specialist

## 2013-04-09 DIAGNOSIS — F200 Paranoid schizophrenia: Secondary | ICD-10-CM

## 2013-04-09 DIAGNOSIS — Z9119 Patient's noncompliance with other medical treatment and regimen: Secondary | ICD-10-CM

## 2013-04-09 NOTE — ED Notes (Signed)
Patient presents dishevel, with moderate body odor, guarded, c/o auditory and visual hallucinations that patient states that she has experienced for years. The hallucinations are of men who who she states are tools that say what they think she wants to hear. Patient denies any current pain, also denies suicidal or homicidal ideations.

## 2013-04-09 NOTE — BH Assessment (Addendum)
Contacted [facility]. Pt is confirmed on wait list per Britta Mccreedy.  Contacted the following facilities for placement: [facility]: At capacity [facility]: At capacity [facility]: At capacity [facility]: At capacity [facility]: At capacity [facility]: At capacity  [facility]: Pt declined by Maryjean Morn, PA due to Pt acuity [facility]: Pt declined.  Harlin Rain Ria Comment, [facility] Triage Specialist

## 2013-04-09 NOTE — ED Notes (Signed)
pt's guardian Shalona Harbour (ph # [id number]) came to psych ed and asked to speak with the nurse. She presented a legal document " letters of appointment guardian of the person" of this patient. She is [facility] [place] She stated the pt is a VET and has been followed by the [place] for many years. RN gave the legal document and information to greg with the ACT team and put a copy of the document in the patients chart. Tammy Sours with act team stated he would investigate this situation.

## 2013-04-09 NOTE — BH Assessment (Signed)
Received called from [facility] at [facility]. Dr. Phillips Hay has declined Pt.  Harlin Rain Ria Comment, [facility] Triage Specialist

## 2013-04-09 NOTE — Consult Note (Signed)
   Patient Identification:  [name] Date of Evaluation:  04/09/2013   Subjective:  Patient states that she does not need to be here.  Patient refuses to answer any other questions during interview.  Patient sitting up on side of bed agitated.  Patient is here related to IVC from [facility] related to noncompliance with medication, refusing to eat fearing that food was poisoned, and being combative at home. We will continue to provide safety while we wait for bed at [facility] and monitor for stabilization.  Past Psychiatric History:  Paranoid Schizophrenia   Past Medical History:     Past Medical History  Diagnosis Date  . Schizophrenia   . Non compliance w medication regimen       History reviewed. No pertinent past surgical history.  Allergies:  Allergies  Allergen Reactions  . Aspirin     Current Medications:  Prior to Admission medications   Not on File    Social History:    reports that she has been smoking.  She does not have any smokeless tobacco history on file. She reports that  drinks alcohol. She reports that she does not use illicit drugs.   Family History:    No family history on file.  Mental Status Examination/Evaluation:  Patient refused to answer questions or participate in the interview stating she does not belong here   DIAGNOSIS:   AXIS I   Medication noncompliance, Paranoid Schizophrenia  AXIS II  Deffered  AXIS III See medical notes.  AXIS IV other psychosocial or environmental problems and problems related to social environment  AXIS V 21-30 behavior considerably influenced by delusions or hallucinations OR serious impairment in judgment, communication OR inability to function in almost all areas   Face to face interview and consult with Dr. Gilmore Laroche  Assessment/Plan:  continue with current plan of care inpatient treatment and referral to [facility].  Continue to offer patient medication despite refusal.  Monitor for safety and stabilization.    Shuvon  B. Rankin FNP-BC Family Nurse Practitioner, Board Certified  I have personally seen the patient and agreed with the findings and involved in the treatment plan. Thresa Ross, MD

## 2013-04-10 DIAGNOSIS — F209 Schizophrenia, unspecified: Secondary | ICD-10-CM

## 2013-04-10 MED ORDER — RISPERIDONE 2 MG PO TBDP
2.0000 mg | ORAL_TABLET | Freq: Two times a day (BID) | ORAL | Status: DC
  Filled 2013-04-10 (×18): qty 1

## 2013-04-10 NOTE — Consult Note (Signed)
  Patient Identification:  [name] Date of Evaluation:  04/10/2013   History of Present Illness:  Patient was brought in from [facility] under IVC for medication non compliant and refusing to eat.  Patient is aggressive and combative to people and she exhibits paranoia.  Patient states she does not belong here in the hospital and  and she refused to eat any food from [place] because their food is poisoned.  Here patient is eating but not taking medications.  She refused to make any eye contact with providers and did not answer questions.  We will start Risperdal -M tab bid while we wait for her admission to [facility].  Past Psychiatric History: Schizophrenia   Past Medical History:     Past Medical History  Diagnosis Date  . Schizophrenia   . Non compliance w medication regimen       History reviewed. No pertinent past surgical history.  Allergies:  Allergies  Allergen Reactions  . Aspirin     Current Medications:  Prior to Admission medications   Not on File    Social History:    reports that she has been smoking.  She does not have any smokeless tobacco history on file. She reports that  drinks alcohol. She reports that she does not use illicit drugs.   Family History:    No family history on file.  Mental Status Examination/Evaluation:  Patient is agitated, paranoid and angry.  Writer is unable to complete full MSE as patient refused to cooperate and wanted this Clinical research associate and Dr Lolly Mustache to leave her room.   DIAGNOSIS:   AXIS I   Schizophrenia  AXIS II  Deffered  AXIS III See medical notes.  AXIS IV other psychosocial or environmental problems, problems related to social environment and problems with primary support group  AXIS V 21-30 behavior considerably influenced by delusions or hallucinations OR serious impairment in judgment, communication OR inability to function in almost all areas     Assessment/Plan:  Face to face interview with Dr Lolly Mustache We will continue to seek  admission as [facility] We will start patient on Risperdal-M tab 2 mg po Bid  We will continue to provide safety.  I have personally seen the patient and agreed with the findings and involved in the treatment plan. Kathryne Sharper, MD

## 2013-04-10 NOTE — BH Assessment (Addendum)
Writer called [facility] to followup on faxed referral. Admissions unit doesn't have the faxed referral. 1040 writer faxed referral to [facility].   1328 Carla at [facility] - pt has been declined d/t high pt acuity.   Evette Cristal, [place] Assessment Counselor

## 2013-04-10 NOTE — BH Assessment (Signed)
Contacted [facility]. Per Merlyn Albert Pt is confirmed on wait list.  Harlin Rain Patsy Baltimore, [facility], [facility] Triage Specialist

## 2013-04-10 NOTE — Progress Notes (Addendum)
CSW confirmed with Brett Canales that patient remains on [facility] wait list as of 10:02am on 04/10/2013.   Per TSS shift report, patient referred to [facility]. CSW left message at [phone]. [phone] regarding transfer.   Frutoso Schatz [phone]  ED CSW 04/10/2013 10:02am   Per discussion with [facility], per April Axlexander patient is on waiting list but no beds expected at this time.   Catha Gosselin, LCSW [phone]  ED CSW .04/10/2013 11:28am

## 2013-04-11 DIAGNOSIS — F200 Paranoid schizophrenia: Secondary | ICD-10-CM | POA: Diagnosis not present

## 2013-04-11 DIAGNOSIS — Z9119 Patient's noncompliance with other medical treatment and regimen: Secondary | ICD-10-CM | POA: Diagnosis not present

## 2013-04-11 NOTE — Consult Note (Signed)
   Patient Identification:  [name] Date of Evaluation:  04/11/2013   History of Present Illness:  Patient continues to be irritated and agitated.  Patient has very strong body odor.  "I have talked enough; I don't have nothing to say; Get out!"   Continue to monitor for safety and stabilization. Will continue with current treatment plan for placement.   Past Psychiatric History: Schizophrenia   Past Medical History:     Past Medical History  Diagnosis Date  . Schizophrenia   . Non compliance w medication regimen       History reviewed. No pertinent past surgical history.  Allergies:  Allergies  Allergen Reactions  . Aspirin     Current Medications:  Prior to Admission medications   Not on File    Social History:    reports that she has been smoking.  She does not have any smokeless tobacco history on file. She reports that  drinks alcohol. She reports that she does not use illicit drugs.   Family History:    No family history on file.  Mental Status Examination/Evaluation:  Patient is agitated, paranoid and angry.  Writer is unable to complete full MSE as patient refused to cooperate and wanted this Clinical research associate and Dr Lolly Mustache to leave her room.   DIAGNOSIS:   AXIS I   Schizophrenia  AXIS II  Deferred  AXIS III See medical notes.  AXIS IV other psychosocial or environmental problems, problems related to social environment and problems with primary support group  AXIS V 21-30 behavior considerably influenced by delusions or hallucinations OR serious impairment in judgment, communication OR inability to function in almost all areas     Assessment/Plan:  Face to face interview with Dr. Lucianne Muss We will continue to seek admission as [facility] We will start patient on Risperdal-M tab 2 mg po Bid  We will continue to provide safety.

## 2013-04-11 NOTE — ED Notes (Signed)
Pt.'s guardian asked for [place] to call her, attempted to call [name] at [phone], no answer, left message with unit's phone number.

## 2013-04-11 NOTE — Progress Notes (Signed)
CSW confirmed with Brett Canales at [facility] that patient remains on [facility] wait list.   .Frutoso Schatz [phone]  ED CSW 04/11/2013. 8:03am

## 2013-04-11 NOTE — ED Notes (Signed)
Meal given to pt.

## 2013-04-11 NOTE — Progress Notes (Signed)
Patient ID: [name], female   DOB: 1960-07-23, 52 y.o.   MRN: [id number] Consult on this patient completed

## 2013-04-11 NOTE — ED Notes (Signed)
Throughout the day, we have offered to assist pt. With anything, offered her food, drinks, to turn on her tv, in the am hours pt.'s response was short/angry, as the day progressed, pt.'s able to speak in calm, short sentences.  NT reports that when he offered to get pt. Snack/drink, pt. Stated that she didn't trust that "they" weren't putting "things" in her food/drink.  Will continue to monitor pt and offer assistance.

## 2013-04-11 NOTE — ED Notes (Signed)
Patient didn't eat her dinner

## 2013-04-12 ENCOUNTER — Encounter (HOSPITAL_COMMUNITY): Payer: Self-pay | Admitting: Registered Nurse

## 2013-04-12 DIAGNOSIS — F200 Paranoid schizophrenia: Secondary | ICD-10-CM | POA: Diagnosis not present

## 2013-04-12 DIAGNOSIS — Z9119 Patient's noncompliance with other medical treatment and regimen: Secondary | ICD-10-CM | POA: Diagnosis not present

## 2013-04-12 MED ORDER — PANTOPRAZOLE SODIUM 40 MG PO TBEC: 40.0000 mg | DELAYED_RELEASE_TABLET | Freq: Every day | ORAL | Status: DC

## 2013-04-12 MED ORDER — CALCIUM CARBONATE ANTACID 500 MG PO CHEW
1.0000 | CHEWABLE_TABLET | ORAL | Status: DC
  Filled 2013-04-12: qty 2

## 2013-04-12 MED ORDER — HALOPERIDOL LACTATE 5 MG/ML IJ SOLN
5.0000 mg | Freq: Two times a day (BID) | INTRAMUSCULAR | Status: DC | PRN
  Administered 2013-04-12 – 2013-04-18 (×12): 5 mg via INTRAMUSCULAR
  Filled 2013-04-12 (×12): qty 1

## 2013-04-12 NOTE — ED Notes (Addendum)
Patient refused risperidone PO. Notified patient that if refusing PO med, IM medication would be given.

## 2013-04-12 NOTE — ED Notes (Signed)
Ardelle Anton, RN into ask patient to shower and patient became verbally aggressive towards her. Patient appeared very angry but eventually went and took shower.

## 2013-04-12 NOTE — ED Notes (Signed)
Spoke with Brett Albino, NP about patient not eating, drinking are taking medications. Np stated that she would pass it on to the day shift providers and informed writer to pass it on in shift report. Writer verbalized understanding.

## 2013-04-12 NOTE — Progress Notes (Signed)
CSW spoke with Caryn Bee at [facility].  Per Caryn Bee, all of the necessary information had been received  except the Transfer Package.  CSW faxed transfer package to [phone].  Marva Panda, Theresia Majors  [phone]  .04/12/2013 9:55 pm

## 2013-04-12 NOTE — ED Notes (Signed)
Patient offered ensure to drink with bottle still sealed but refused stating " it's contaminated from the company and she didn't want it." Patient refused everything that was offered to her like water, snacks and sandwiches. Patient also refused vital signs and did not eat here dinner.

## 2013-04-12 NOTE — Consult Note (Signed)
    Patient Identification:  [name] Date of Evaluation:  04/12/2013   History of Present Illness: Patient continues to be psychotic.  Patient agitated with interview.  "Just let me go and I will be fine; I don't want to talk; I don't want no medicine.  Nurse and Tech reports that patient  Is now refusing to eat and continues to refuse medication.  Patient still has strong malodorous body odor.  Nurse states patient did take a bath and bed liens was change yesterday and patient hasn't slept in bed since.  Will force meds if patient refuses medications today.   Past Psychiatric History: Schizophrenia   Past Medical History:     Past Medical History  Diagnosis Date  . Schizophrenia   . Non compliance w medication regimen       History reviewed. No pertinent past surgical history.  Allergies:  Allergies  Allergen Reactions  . Aspirin     Current Medications:  Prior to Admission medications   Not on File    Social History:    reports that she has been smoking.  She does not have any smokeless tobacco history on file. She reports that  drinks alcohol. She reports that she does not use illicit drugs.   Family History:    No family history on file.  Mental Status Examination/Evaluation:  Patient is agitated, paranoid and angry.  Writer is unable to complete full MSE as patient refused to cooperate and wanted this Clinical research associate and Dr Lolly Mustache to leave her room.   DIAGNOSIS:   AXIS I   Schizophrenia  AXIS II  Deferred  AXIS III See medical notes.  AXIS IV other psychosocial or environmental problems, problems related to social environment and problems with primary support group  AXIS V 21-30 behavior considerably influenced by delusions or hallucinations OR serious impairment in judgment, communication OR inability to function in almost all areas     Assessment/Plan:  Face to face interview with Dr. Lolly Mustache We will continue to seek admission as [facility] Start Haldol 1 mg Bid PO or IM  if refuse PO  Shuvon B. Rankin FNP-BC Family Nurse Practitioner, Board Certified  I have personally seen the patient and agreed with the findings and involved in the treatment plan. Kathryne Sharper, MD

## 2013-04-12 NOTE — ED Notes (Signed)
When I asked her if she needed a nicotine patch stated "I don't need anything but departure from here."

## 2013-04-12 NOTE — BH Assessment (Signed)
Per Vicky at [facility], pt still on [facility] wait list. Vicky was unable to tell writer how many pts are ahead of this pt on wait list.  [name] Paige Lamaria Hildebrandt, LCSWA Assessment Counselor  

## 2013-04-12 NOTE — ED Notes (Signed)
Patient came to nurses station and asked for a new pajama top. Patient given a new scrub top. Patient then went into restroom and change top.

## 2013-04-12 NOTE — BH Assessment (Signed)
Magistrate [place] confirmed receipt of faxed IVC paperwork. Originals placed in IVC NB in psych ED.  Evette Cristal, [place] Assessment Counselor

## 2013-04-12 NOTE — Progress Notes (Signed)
CSW called [facility] and spoke with AOD, Kevin [phone]. [phone].  Per Caryn Bee the guardian would need to sign [name]'s Preference to Transfer form and a note needed to be made that the pt is not competent to sign paperwork herself.  CSW contacted pts Guardian [name] [phone] to ask her if she would come over and sign paperwork for pts referral.  She reported that she would come to the ED to sign paperwork.  [name], Theresia Majors  [phone]  .04/12/2013

## 2013-04-13 DIAGNOSIS — F200 Paranoid schizophrenia: Secondary | ICD-10-CM | POA: Diagnosis not present

## 2013-04-13 DIAGNOSIS — Z9119 Patient's noncompliance with other medical treatment and regimen: Secondary | ICD-10-CM | POA: Diagnosis not present

## 2013-04-13 LAB — URINALYSIS, ROUTINE W REFLEX MICROSCOPIC
Bilirubin Urine: NEGATIVE
Glucose, UA: NEGATIVE mg/dL
Specific Gravity, Urine: 1.033 — ABNORMAL HIGH (ref 1.005–1.030)
Urobilinogen, UA: 0.2 mg/dL (ref 0.0–1.0)
pH: 5 (ref 5.0–8.0)

## 2013-04-13 LAB — URINE MICROSCOPIC-ADD ON

## 2013-04-13 LAB — CBC
HCT: 41.9 % (ref 36.0–46.0)
Hemoglobin: 14.3 g/dL (ref 12.0–15.0)
MCHC: 34.1 g/dL (ref 30.0–36.0)
RBC: 5.03 MIL/uL (ref 3.87–5.11)

## 2013-04-13 LAB — BASIC METABOLIC PANEL
BUN: 14 mg/dL (ref 6–23)
CO2: 23 mEq/L (ref 19–32)
Chloride: 100 mEq/L (ref 96–112)
GFR calc non Af Amer: >90 mL/min (ref >90)
Glucose, Bld: 82 mg/dL (ref 70–99)
Potassium: 4 mEq/L (ref 3.5–5.1)
Sodium: 142 mEq/L (ref 135–145)

## 2013-04-13 NOTE — BHH Counselor (Addendum)
Writer received confirmation from Caryn Bee) at the [facility] that the requested UR faxed information has been received.    Caryn Bee reports after a review of the urinalysis and documentation of the patient not eating for several days that the patients transfer has been cancelled pending medical stabilization.  Caryn Bee reports that when the patient has is medically stable her information can be resubmitted with documentation that the patient has been eating.    [facility] phone number from 7a -4pm [phone] Ext [phone].  If you are calling after 4pm use    Ext [phone] or [phone].   Writer informed the [facility] office, ER MD and the nurse working with the patient.

## 2013-04-13 NOTE — ED Notes (Signed)
Patient came to desk requesting everything to get a shower.she is now in shower

## 2013-04-13 NOTE — Consult Note (Signed)
     Patient Identification:  [name] Date of Evaluation:  04/13/2013   History of Present Illness: Patient sitting in chair in room.  Patient seems calmer this morning.  She is not argumentative, and not yelling when interviewer ask questions.  Patient states that she does not want to eat her breakfast and that she doesn't want to sleep in the bed.  "No I don't want to eat; No I don't want to lay in that bed."  Patient stated "The facility is to hard."  Nurses states that patient has not eaten or drank anything in 6 days and has not slept in her bed since the linen was changed.  Patient has been to bathroom to void.  Nurses will keep a closer eye of patient input and output.  Will order labs to monitor patient status.    Past Medical History:     Past Medical History  Diagnosis Date  . Schizophrenia   . Non compliance w medication regimen       History reviewed. No pertinent past surgical history.  Allergies:  Allergies  Allergen Reactions  . Aspirin   . Geodon [Ziprasidone Hcl]     [facility]    Current Medications:  Prior to Admission medications   Not on File    Social History:    reports that she has been smoking.  She does not have any smokeless tobacco history on file. She reports that  drinks alcohol. She reports that she does not use illicit drugs.   Family History:    No family history on file.  Mental Status Examination/Evaluation:  Patient is withdrawen, paranoid and angry.  Writer is unable to complete full MSE as patient uncooperative.   DIAGNOSIS:   AXIS I   Schizophrenia  AXIS II  Deferred  AXIS III See medical notes.  AXIS IV other psychosocial or environmental problems, problems related to social environment and problems with primary support group  AXIS V 21-30 behavior considerably influenced by delusions or hallucinations OR serious impairment in judgment, communication OR inability to function in almost all areas    Face to face interview and consult with Dr. Ladona Ridgel  Assessment/Plan:  Continue current plan of care monitoring for safety and stabilization; providing medication and needed nourishment; and seeking placement.  Patient remains on [facility].   Obtain CBC and BMET  Shuvon B. Rankin FNP-BC Family Nurse Practitioner, Board Certified

## 2013-04-13 NOTE — ED Notes (Signed)
Dr. Thurnell Lose requested further assessment by MD/Extender for additional recommendations r/t continued psychosis and refusal of food and fluids. Call placed to Dellie Catholic at [facility] to discuss with oncoming extender.

## 2013-04-13 NOTE — Consult Note (Signed)
Patient seen, evaluated and recommendations made by me 

## 2013-04-13 NOTE — Progress Notes (Signed)
CSW spoke with Renne Musca, who stated that patient needs to have a UA. However at this time patient is too acutely psychotic to obtain easily. CSW left message with April at [facility] at [phone] regarding the UA. Patient does not have a hearing date. Patient has no pertinent past medical history beside schizophrenia per MD notes. Patient notes from NP also need to be cosigned by MD on 04/11/2013. CSW left packet on TTS desk to be followed up on.   Catha Gosselin, LCSW [phone]  ED CSW .04/13/2013 13:27pm

## 2013-04-13 NOTE — ED Notes (Signed)
Patient came up to nurses station stating that there was a tray in her room. Patient has been more pleasant and vocal this evening/night. Still not eating or drinking and refusing medications.

## 2013-04-13 NOTE — ED Provider Notes (Signed)
Patient rejected from [place] because she hasn't been eating and drinking and UA showed > 80 ketones. I think her dehydration is from psychosis. She refused IVF. I talked to psych PA and request that we change her psych meds to better manage her psychosis so that she will start eating and drinking again.   Richardean Canal, MD 04/13/13 [phone]

## 2013-04-14 DIAGNOSIS — F209 Schizophrenia, unspecified: Secondary | ICD-10-CM | POA: Diagnosis not present

## 2013-04-14 NOTE — Consult Note (Signed)
  Patient Identification:  [name] Date of Evaluation:  04/14/2013   History of Present Illness:  Patient have been with [place] in the [facility] for few days under IVC for agitation, combative behavior and medication non compliant.  Patient was refusing meals because she felt they were poisoned and she refuses medications because she did not think she need them.  We will continue with IM Haldol while we wait for placement at [facility] as planned.  We will continue to meet patients needs.  Today she ate 75% of her breakfast.  We are seeing some improvement as she is less aggressive towards staff.  Past Psychiatric History: Schizophrenia   Past Medical History:     Past Medical History  Diagnosis Date  . Schizophrenia   . Non compliance w medication regimen       History reviewed. No pertinent past surgical history.  Allergies:  Allergies  Allergen Reactions  . Aspirin   . Geodon [Ziprasidone Hcl]     [facility]    Current Medications:  Prior to Admission medications   Not on File    Social History:    reports that she has been smoking.  She does not have any smokeless tobacco history on file. She reports that  drinks alcohol. She reports that she does not use illicit drugs.   Family History:    No family history on file.  Mental Status Examination/Evaluation:Psychiatric Specialty Exam: @PHYSEXAMBYAGE2 @  @ROS @  Blood pressure 97/63, pulse 62, temperature 98.2 F (36.8 C), temperature source Oral, resp. rate 18, SpO2 98.00%.There is no weight on file to calculate BMI.  General Appearance: Casual  Eye Contact::  Absent  Speech:  Pressured  Volume:  Increased  Mood:  Angry, Anxious, Depressed and Irritable  Affect:  Congruent, Constricted and Depressed  Thought Process:  Circumstantial, Disorganized and Tangential  Orientation:  Full (Time, Place, and Person)  Thought Content:  Paranoid Ideation  Suicidal Thoughts:  No  Homicidal Thoughts:  No  Memory:   Immediate;   Poor Recent;   Fair Remote;   Poor  Judgement:  Poor  Insight:  Lacking  Psychomotor Activity:  Normal  Concentration:  Poor  Recall:  NA  Akathisia:  NA  Handed:  Right  AIMS (if indicated):     Assets:  Desire for Improvement  Sleep:          DIAGNOSIS:   AXIS I   SCHIZOPHRENIA, Paranoid type  AXIS II  Deffered  AXIS III See medical notes.  AXIS IV other psychosocial or environmental problems, problems related to social environment and problems with primary support group  AXIS V 21-30 behavior considerably influenced by delusions or hallucinations OR serious impairment in judgment, communication OR inability to function in almost all areas     Assessment/Plan: Face to face assessment with Dr Ladona Ridgel We will continue to medicate patient with IM Haldol We will continue to wait for placement at [facility].

## 2013-04-14 NOTE — ED Notes (Signed)
Pt ate about 75% of her meal and drank 1 cup of coffee and 1/2 cup of apple juice

## 2013-04-14 NOTE — ED Notes (Signed)
On initial assessment, pt very hostile, belligerent and uncooperative. She repeatedly told writer to leave the room. She said she just wanted to leave here and writer tried to explain to her if she eats and drinks or allows [place] to give her an IV she could go to the [facility] hospital but pt refused. She wasn't interested in Ensure or anything the writer had to say which she said were all lies and that all food and drink are contaminated. She said she'll stay here until the government comes to get her.   Later, after she'd showered, security and mht at bedside pt was a lot calmer but still paranoid about the medicine. Explained to her she could read the package, open it and the medicine would just melt on her tongue. Pt said if we had to force medicine than she'd take a shot. Shot given without incident with mht at bedside.   Pt said if she was brought new sheets and blankets she may make the bed herself and lay down in it. Pt refused the offer to get her vaseline for her lips with are now very red and cracked she said it won't help, she has nothing in her belongings that will help and she just needs fresh air and it will mend on it's own. Pt denied any food or drink suggested. She said she will be all right and she's fine now.

## 2013-04-14 NOTE — ED Notes (Signed)
Pt ate 90% of her lunch meal and drank a cup a juice.

## 2013-04-15 ENCOUNTER — Encounter (HOSPITAL_COMMUNITY): Payer: Self-pay | Admitting: Registered Nurse

## 2013-04-15 DIAGNOSIS — F200 Paranoid schizophrenia: Secondary | ICD-10-CM | POA: Diagnosis not present

## 2013-04-15 NOTE — Consult Note (Signed)
Patient Identification:  [name] Date of Evaluation:  04/15/2013   History of Present Illness: Patient sitting on bed today.  Patient states that she got sleep last night.  "I'm here; yes I got some sleep.  NO I did not eat.  The network has impurities in the food and I don't want all those impurities in me.  Yes I do take showers, I have to keep my hygiene up.  No I don't take a shower everyday; I took one day before yesterday; If you take showers everyday; the network will cause it to dry out your pores."  Patient is talking more and has progressed back to sitting on bed instead of sitting in chair all night.  Patient still has a strong body odor and room odor.  Patient appears to be improving, yet sill paranoid about food.  Nurse informs that patient is eating.   Stating that she ate 95 percent of breakfast this morning and was eating yesterday.  Patient is also taking medication; patient prefers injection haldol.    Past Psychiatric History: Schizophrenia   Past Medical History:     Past Medical History  Diagnosis Date  . Schizophrenia   . Non compliance w medication regimen       History reviewed. No pertinent past surgical history.  Allergies:  Allergies  Allergen Reactions  . Aspirin   . Geodon [Ziprasidone Hcl]     [facility]ed [facility] from [facility]    Current Medications:  Prior to Admission medications   Not on File    Social History:    reports that she has been smoking.  She does not have any smokeless tobacco history on file. She reports that  drinks alcohol. She reports that she does not use illicit drugs.   Family History:    No family history on file.  Mental Status Examination/Evaluation:Psychiatric Specialty Exam: @PHYSEXAMBYAGE2 @  @ROS @  Blood pressure 98/66, pulse 69, temperature 98.8 F (37.1 C), temperature source Oral, resp. rate 18, SpO2 98.00%.There is no weight on file to calculate BMI.  General Appearance: Casual  Eye Contact::  Absent   Speech:  Pressured  Volume:  Increased  Mood:  Angry, Anxious, Depressed and Irritable  Affect:  Congruent, Constricted and Depressed  Thought Process:  Circumstantial, Disorganized and Tangential  Orientation:  Full (Time, Place, and Person)  Thought Content:  Paranoid Ideation  Suicidal Thoughts:  No  Homicidal Thoughts:  No  Memory:  Immediate;   Poor Recent;   Fair Remote;   Poor  Judgement:  Poor  Insight:  Lacking  Psychomotor Activity:  Normal  Concentration:  Poor  Recall:  NA  Akathisia:  NA  Handed:  Right  AIMS (if indicated):     Assets:  Desire for Improvement  Sleep:          DIAGNOSIS:   AXIS I   SCHIZOPHRENIA, Paranoid type  AXIS II  Deferred  AXIS III See medical notes.  AXIS IV other psychosocial or environmental problems, problems related to social environment and problems with primary support group  AXIS V 21-30 behavior considerably influenced by delusions or hallucinations OR serious impairment in judgment, communication OR inability to function in almost all areas   Face to face interview and consult with Dr. Lolly Mustache  Assessment/Plan:  Continue to medicate patient with IM Haldol if patient refuses PO medication.  Continue to monitor for safety and stabilization.   [facility]  Shuvon B.  Rankin FNP-BC Family Nurse Practitioner, Board Certified  I have personally seen the patient and agreed with the findings and involved in the treatment plan. Kathryne Sharper, MD

## 2013-04-15 NOTE — ED Notes (Signed)
Came to nurses station requesting to have a shower...supplies given. Very polite and thankful.

## 2013-04-15 NOTE — ED Notes (Signed)
For lunch she ate all the spaghetti and drank the tea.

## 2013-04-15 NOTE — ED Notes (Addendum)
For breakfast she ate 95% and drank all of her coffee.

## 2013-04-15 NOTE — BH Assessment (Signed)
Contacted [facility]. Pt is confirmed on wait list per Marylene Land.  Harlin Rain Ria Comment, [facility] Triage Specialist

## 2013-04-15 NOTE — ED Notes (Signed)
Haldol given late due to she has been resting in her bed. Cooperative with shot. Stated she would rather have the shot instead of the pills.

## 2013-04-16 DIAGNOSIS — F200 Paranoid schizophrenia: Secondary | ICD-10-CM | POA: Diagnosis not present

## 2013-04-16 NOTE — Consult Note (Signed)
    Patient Identification:  [name] Date of Evaluation:  04/16/2013   History of Present Illness:  Patient slept in bed last night.  Patient states that she is feeling better.  "I want to go home."  Patient continues to eat her meals.  Patient refuses PO medication but is willing to take the injection. Patient seen in hallway asking staff for items for bath.   Past Psychiatric History: Schizophrenia   Past Medical History:     Past Medical History  Diagnosis Date  . Schizophrenia   . Non compliance w medication regimen       History reviewed. No pertinent past surgical history.  Allergies:  Allergies  Allergen Reactions  . Aspirin   . Geodon [Ziprasidone Hcl]     Listed in [facility] from [facility]    Current Medications:  Prior to Admission medications   Not on File    Social History:    reports that she has been smoking.  She does not have any smokeless tobacco history on file. She reports that  drinks alcohol. She reports that she does not use illicit drugs.   Family History:    No family history on file.  Mental Status Examination/Evaluation:Psychiatric Specialty Exam: @PHYSEXAMBYAGE2 @  @ROS @  Blood pressure 99/65, pulse 82, temperature 98.8 F (37.1 C), temperature source Oral, resp. rate 20, SpO2 98.00%.There is no weight on file to calculate BMI.  General Appearance: Casual  Eye Contact::  Absent  Speech:  Pressured  Volume:  Increased  Mood:  Angry, Anxious, Depressed and Irritable  Affect:  Congruent, Constricted and Depressed  Thought Process:  Circumstantial, Disorganized and Tangential  Orientation:  Full (Time, Place, and Person)  Thought Content:  Paranoid Ideation  Suicidal Thoughts:  No  Homicidal Thoughts:  No  Memory:  Immediate;   Poor Recent;   Fair Remote;   Poor  Judgement:  Poor  Insight:  Lacking  Psychomotor Activity:  Normal  Concentration:  Poor  Recall:  NA  Akathisia:  NA  Handed:  Right  AIMS (if indicated):      Assets:  Desire for Improvement  Sleep:          DIAGNOSIS:   AXIS I   SCHIZOPHRENIA, Paranoid type  AXIS II  Deferred  AXIS III See medical notes.  AXIS IV other psychosocial or environmental problems, problems related to social environment and problems with primary support group  AXIS V 21-30 behavior considerably influenced by delusions or hallucinations OR serious impairment in judgment, communication OR inability to function in almost all areas   Face to face interview and consult with Dr. Lolly Mustache  Assessment/Plan:  Continue to medicate patient with IM Haldol if patient refuses PO medication.  Continue to monitor for safety and stabilization.   [facility]  Paula B. Rankin FNP-BC Family Nurse Practitioner, Board Certified  I have personally seen the patient and agreed with the findings and involved in the treatment plan. Kathryne Sharper, MD

## 2013-04-16 NOTE — BH Assessment (Signed)
Contacted [facility]. Pt confirmed on wait list per Angela.  Kaelani Kendrick Ellis Leilanie Rauda Jr, LPC, [facility] Triage Specialist  

## 2013-04-17 DIAGNOSIS — F209 Schizophrenia, unspecified: Secondary | ICD-10-CM | POA: Diagnosis not present

## 2013-04-17 LAB — COMPREHENSIVE METABOLIC PANEL
AST: 39 U/L — ABNORMAL HIGH (ref 0–37)
Albumin: 3.4 g/dL — ABNORMAL LOW (ref 3.5–5.2)
BUN: 18 mg/dL (ref 6–23)
Calcium: 8.9 mg/dL (ref 8.4–10.5)
Chloride: 104 mEq/L (ref 96–112)
Creatinine, Ser: 0.78 mg/dL (ref 0.50–1.10)
GFR calc non Af Amer: >90 mL/min (ref >90)
Sodium: 141 mEq/L (ref 135–145)
Total Bilirubin: 0.2 mg/dL — ABNORMAL LOW (ref 0.3–1.2)

## 2013-04-17 LAB — CBC
HCT: 40.2 % (ref 36.0–46.0)
MCV: 84.1 fL (ref 78.0–100.0)
RDW: 12.3 % (ref 11.5–15.5)
WBC: 4.6 10*3/uL (ref 4.0–10.5)

## 2013-04-17 NOTE — Progress Notes (Signed)
CSW faxed updated clinicals to [facility]. [facility] requested guardian to sign [facility] request form. CSW spoke with TTS to follow up with pt guardian and [place].   Marland KitchenCatha Gosselin, [place] [phone]  ED CSW .04/17/2013 1507pm

## 2013-04-17 NOTE — BHH Counselor (Signed)
Writer informed Dr. Blinda Leatherwood that the pt has been accepted [facility] and the pt cannot be transferred until the documents are signed by her guardian. Writer spoke with  Ms. [name], guardian and she will come to the psy ed to sign the [facility] documents. Denice Bors, [facility] 04/17/2013 4:07 PM

## 2013-04-17 NOTE — Progress Notes (Addendum)
Writer spoke with the pt's mother and got the number for the pt's guardian. The pt's guardian is her sister [name] [phone] (cell). Writer called and left her a message to come to the hospital to sign the [place] documents that require a pt's signature. April from [place] called and stated that they will take the pt. Denice Bors, [facility] 04/17/2013 3:47 PM

## 2013-04-17 NOTE — Progress Notes (Addendum)
CSW received call from [facility] [place] (April, ph: [phone]) re: potential bed for pt. Per April, [place] doc needs malnutrition workup on pt. CSW requested appropriate labs from EDP. [facility] will contact [facility] when [facility] are in.  [name] [place], [phone]     ED CSW

## 2013-04-17 NOTE — Progress Notes (Addendum)
April from [facility] [place] requested updated labs to check pt current nourishment levels. CSW faxed them to [phone]. CSW left message for April at [phone] to inform of the fax being sent.   Catha Gosselin, LCSW [phone]  ED CSW .04/17/2013 1426pm   CSW spoke with TSS to follow up this evening with [facility] at [phone]   .Catha Gosselin, [place] [phone]  ED CSW .04/17/2013 1426pm

## 2013-04-17 NOTE — Consult Note (Signed)
  Patient Identification:  [name] Date of Evaluation:  04/17/2013   History of Present Illness:  Patient was seen this am very angry, speaking loud and asking to be discharged home.  She states she will not "put your medicine or food in my mouth because it is poisoned"  She did not make any eye contact with the providers and   Ushered them to leave her room.  We will continue giving her Haldol 5 mg im and continue waiting for bed at [facility].  Past Psychiatric History:  Paranoid Schizophrenia   Past Medical History:     Past Medical History  Diagnosis Date  . Schizophrenia   . Non compliance w medication regimen       History reviewed. No pertinent past surgical history.  Allergies:  Allergies  Allergen Reactions  . Aspirin   . Geodon [Ziprasidone Hcl]     Listed in [facility] from [facility]    Current Medications:  Prior to Admission medications   Not on File    Social History:    reports that she has been smoking.  She does not have any smokeless tobacco history on file. She reports that  drinks alcohol. She reports that she does not use illicit drugs.   Family History:    No family history on file.  Mental Status Examination/Evaluation:Psychiatric Specialty Exam: @PHYSEXAMBYAGE2 @  @ROS @  Blood pressure 108/74, pulse 67, temperature 98.5 F (36.9 C), temperature source Oral, resp. rate 16, SpO2 98.00%.There is no weight on file to calculate BMI.  General Appearance: Well Groomed  Patent attorney::  Poor  Speech:  Clear and Coherent and Pressured  Volume:  Increased  Mood:  Angry, Anxious, Depressed and Irritable  Affect:  Depressed and Flat  Thought Process:  Tangential  Orientation:  Full (Time, Place, and Person)  Thought Content:  Paranoid Ideation  Suicidal Thoughts:  No  Homicidal Thoughts:  No  Memory:  Immediate;   Fair Recent;   Fair Remote;   Fair  Judgement:  Poor  Insight:  Lacking and Shallow  Psychomotor Activity:  Normal  Concentration:  Fair   Recall:  NA  Akathisia:  NA  Handed:  Right  AIMS (if indicated):     Assets:  Desire for Improvement  Sleep:          DIAGNOSIS:   AXIS I   SCHIZOPHRENIA  AXIS II  Deffered  AXIS III See medical notes.  AXIS IV other psychosocial or environmental problems, problems related to social environment and problems with primary support group  AXIS V 31-40 impairment in reality testing     Assessment/Plan: Face to face interview with Dr Lolly Mustache We will continue with IM Haldol injection bid We will continue to wait for [facility] placement We will continue to encourage po nourishment and fluids. [name]   PMHNP-BC  I have personally seen the patient and agreed with the findings and involved in the treatment plan. Kathryne Sharper, MD

## 2013-04-18 DIAGNOSIS — F209 Schizophrenia, unspecified: Secondary | ICD-10-CM | POA: Diagnosis not present

## 2013-04-18 NOTE — Consult Note (Signed)
  Patient Identification:  [name] Date of Evaluation:  04/18/2013   History of Present Illness:  Patient sitting on side of bed.  Patient states that she slept good last night.  Patient states that she is eating and "I feel okay.  Patient continues to improve.  Nurse reports that patient is taking her medication and eats 95 percent of medication.   Past Psychiatric History:  Paranoid Schizophrenia   Past Medical History:     Past Medical History  Diagnosis Date  . Schizophrenia   . Non compliance w medication regimen       History reviewed. No pertinent past surgical history.  Allergies:  Allergies  Allergen Reactions  . Aspirin   . Geodon [Ziprasidone Hcl]     Listed in [facility] from [facility]    Current Medications:  Prior to Admission medications   Not on File    Social History:    reports that she has been smoking.  She does not have any smokeless tobacco history on file. She reports that  drinks alcohol. She reports that she does not use illicit drugs.   Family History:    No family history on file.  Mental Status Examination/Evaluation:Psychiatric Specialty Exam: @PHYSEXAMBYAGE2 @  @ROS @  Blood pressure 99/80, pulse 72, temperature 97.9 F (36.6 C), temperature source Oral, resp. rate 22, SpO2 95.00%.There is no weight on file to calculate BMI.  General Appearance: Casual  Eye Contact::  Poor  Speech:  Clear and Coherent and Pressured  Volume:  Increased  Mood:  Anxious and Depressed  Affect:  Depressed and Flat  Thought Process:  Tangential  Orientation:  Full (Time, Place, and Person)  Thought Content:  Paranoid Ideation  Suicidal Thoughts:  No  Homicidal Thoughts:  No  Memory:  Immediate;   Fair Recent;   Fair Remote;   Fair  Judgement:  Poor  Insight:  Lacking and Shallow  Psychomotor Activity:  Normal  Concentration:  Fair  Recall:  NA  Akathisia:  NA  Handed:  Right  AIMS (if indicated):     Assets:  Desire for Improvement  Sleep:           DIAGNOSIS:   AXIS I   SCHIZOPHRENIA  AXIS II  Deferred  AXIS III See medical notes.  AXIS IV other psychosocial or environmental problems, problems related to social environment and problems with primary support group  AXIS V 31-40 impairment in reality testing   Face to face interview and consult with Dr. Ladona Ridgel  Assessment/Plan:   Disposition:  Patient accepted to [facility].  Will discharge to inpatient treatment facility.

## 2013-04-18 NOTE — Consult Note (Signed)
Note reviewed and agreed with  

## 2013-04-18 NOTE — ED Notes (Signed)
Pt discharged to the care of [name] Dept to be transported to the [place] in [place]. She continues to be paranoid and resistive to PO meds, but did take IM haldol without incident. Her vital signs were stable and she appeared to be in no acute distress.

## 2013-04-18 NOTE — ED Provider Notes (Signed)
Accepted at [place] by Dr. Ferdinand Lango. Plan transport by [facility] after Salli Real, MD 04/18/13 [phone]

## 2013-04-18 NOTE — BHH Counselor (Signed)
Writer has contacted [facility] Office regarding the patients updated disposition status for discharge.

## 2014-03-10 ENCOUNTER — Encounter (HOSPITAL_COMMUNITY): Payer: Self-pay | Admitting: Emergency Medicine

## 2014-03-10 ENCOUNTER — Emergency Department (HOSPITAL_COMMUNITY)
Admission: EM | Admit: 2014-03-10 | Discharge: 2014-03-12 | Disposition: A | Discharge status: Other Acute Inpatient Hospital | Payer: Medicare Other | Attending: Emergency Medicine | Admitting: Emergency Medicine

## 2014-03-10 DIAGNOSIS — IMO0002 Reserved for concepts with insufficient information to code with codable children: Secondary | ICD-10-CM | POA: Diagnosis not present

## 2014-03-10 DIAGNOSIS — R443 Hallucinations, unspecified: Secondary | ICD-10-CM | POA: Insufficient documentation

## 2014-03-10 DIAGNOSIS — F200 Paranoid schizophrenia: Secondary | ICD-10-CM | POA: Insufficient documentation

## 2014-03-10 DIAGNOSIS — F29 Unspecified psychosis not due to a substance or known physiological condition: Secondary | ICD-10-CM | POA: Insufficient documentation

## 2014-03-10 DIAGNOSIS — Z9119 Patient's noncompliance with other medical treatment and regimen: Secondary | ICD-10-CM | POA: Diagnosis not present

## 2014-03-10 DIAGNOSIS — F209 Schizophrenia, unspecified: Secondary | ICD-10-CM | POA: Diagnosis not present

## 2014-03-10 DIAGNOSIS — F17200 Nicotine dependence, unspecified, uncomplicated: Secondary | ICD-10-CM | POA: Insufficient documentation

## 2014-03-10 DIAGNOSIS — Z91199 Patient's noncompliance with other medical treatment and regimen due to unspecified reason: Secondary | ICD-10-CM | POA: Diagnosis not present

## 2014-03-10 DIAGNOSIS — F23 Brief psychotic disorder: Secondary | ICD-10-CM

## 2014-03-10 LAB — CBC WITH DIFFERENTIAL/PLATELET
BASOS ABS: 0 10*3/uL (ref 0.0–0.1)
BASOS PCT: 0 % (ref 0–1)
EOS ABS: 0 10*3/uL (ref 0.0–0.7)
EOS PCT: 0 % (ref 0–5)
HCT: 39 % (ref 36.0–46.0)
Hemoglobin: 13 g/dL (ref 12.0–15.0)
Lymphocytes Relative: 18 % (ref 12–46)
Lymphs Abs: 1.6 10*3/uL (ref 0.7–4.0)
MCH: 27.7 pg (ref 26.0–34.0)
MCHC: 33.3 g/dL (ref 30.0–36.0)
MCV: 83.2 fL (ref 78.0–100.0)
MONO ABS: 0.5 10*3/uL (ref 0.1–1.0)
MONOS PCT: 6 % (ref 3–12)
Neutro Abs: 6.9 10*3/uL (ref 1.7–7.7)
Neutrophils Relative %: 76 % (ref 43–77)
Platelets: 247 10*3/uL (ref 150–400)
RBC: 4.69 MIL/uL (ref 3.87–5.11)
RDW: 13.1 % (ref 11.5–15.5)
WBC: 9 10*3/uL (ref 4.0–10.5)

## 2014-03-10 MED ORDER — ONDANSETRON HCL 4 MG PO TABS: 4.0000 mg | ORAL_TABLET | Freq: Three times a day (TID) | ORAL | Status: DC | PRN

## 2014-03-10 MED ORDER — ACETAMINOPHEN 325 MG PO TABS: 650.0000 mg | ORAL_TABLET | ORAL | Status: DC | PRN

## 2014-03-10 MED ORDER — ALUM & MAG HYDROXIDE-SIMETH 200-200-20 MG/5ML PO SUSP: 30.0000 mL | ORAL | Status: DC | PRN

## 2014-03-10 MED ORDER — LORAZEPAM 1 MG PO TABS
1.0000 mg | ORAL_TABLET | Freq: Three times a day (TID) | ORAL | Status: DC | PRN
  Administered 2014-03-11 (×2): 1 mg via ORAL
  Filled 2014-03-10 (×2): qty 1

## 2014-03-10 MED ORDER — ZOLPIDEM TARTRATE 5 MG PO TABS: 5.0000 mg | ORAL_TABLET | Freq: Every evening | ORAL | Status: DC | PRN

## 2014-03-10 NOTE — ED Provider Notes (Signed)
CSN: [id number]     Arrival date & time 03/10/14  2119 History  This chart was scribed for non-physician practitioner working with Hurman HornJohn M Bednar, MD by Elveria Risingimelie Horne, ED Scribe. This patient was seen in room WLCON/WLCON and the patient's care was started at 11:12 PM.   Chief Complaint  Patient presents with  . Hallucinations    The history is provided by the patient. No language interpreter was used.   HPI Comments: Sonia Sidesis Batch is a 53 y.o. female with history of schizophrenia and non compliance with medication brought in by [facility], who presents to the Emergency Department after being involuntarily committed. Patient is experiencing hallucinations to which she has been talking with "pressured speech." Patient states that she just wants to shower and go to bed. Patient is visibly upset and agitated and is speaking of government conspiracies and tampering with her home.   Past Medical History  Diagnosis Date  . Schizophrenia   . Non compliance w medication regimen    History reviewed. No pertinent past surgical history. History reviewed. No pertinent family history. History  Substance Use Topics  . Smoking status: Current Every Day Smoker  . Smokeless tobacco: Not on file  . Alcohol Use: Yes   OB History   Grav Para Term Preterm Abortions TAB SAB Ect Mult Living                 Review of Systems  Constitutional: Negative for fever and chills.  Psychiatric/Behavioral: Positive for hallucinations, behavioral problems and agitation.    Allergies  Aspirin and Geodon  Home Medications   Prior to Admission medications   Not on File   Triage Vitals: BP 111/66  Pulse 65  Temp(Src) 97.6 F (36.4 C) (Oral)  Resp 18  SpO2 100%  Physical Exam  Nursing note and vitals reviewed. Constitutional: She is oriented to person, place, and time. She appears well-developed and well-nourished.  HENT:  Head: Normocephalic and atraumatic.  Eyes:  Normal appearance  Neck: Normal range of  motion.  Pulmonary/Chest: Effort normal.  Musculoskeletal: Normal range of motion.  Neurological: She is alert and oriented to person, place, and time.  Psychiatric: Her speech is rapid and/or pressured. She is agitated and actively hallucinating.    ED Course  Procedures (including critical care time)  COORDINATION OF CARE: 11:17 PM- Discussed treatment plan with patient at bedside and patient agreed to plan.   Labs Review Labs Reviewed  BASIC METABOLIC PANEL - Abnormal; Notable for the following:    Potassium 3.6 (*)    Glucose, Bld 105 (*)    All other components within normal limits  SALICYLATE LEVEL - Abnormal; Notable for the following:    Salicylate Lvl <2.0 (*)    All other components within normal limits  CBC WITH DIFFERENTIAL  ACETAMINOPHEN LEVEL  ETHANOL  URINE RAPID DRUG SCREEN (HOSP PERFORMED)    Imaging Review No results found.   EKG Interpretation None      MDM   Final diagnoses:  Paranoid schizophrenia  Acute psychosis    Patient medically cleared and will be evaluated by TTS.   I personally performed the services described in this documentation, which was scribed in my presence. The recorded information has been reviewed and is accurate.    Emilia BeckKaitlyn Donise Woodle, [place] 03/27/14 [phone]

## 2014-03-10 NOTE — ED Notes (Signed)
Pt transported to ED by [facility] after IVC issued by [facility]. Pt reportedly aggressive towards family with paranoid delusions regarding military plotting and her sister trying to put her away.

## 2014-03-10 NOTE — ED Notes (Signed)
Bed: WLPT3 Expected date: 03/10/14 Expected time: 9:06 PM Means of arrival: Ambulance Comments: Sheriffs dept/voluntary

## 2014-03-10 NOTE — ED Notes (Signed)
Pt is talking of government conspiracy to contaminate the water and being unable to shower at home.  Pt's speech is pressured.  Pt is alert to self only.

## 2014-03-11 DIAGNOSIS — F209 Schizophrenia, unspecified: Secondary | ICD-10-CM | POA: Diagnosis not present

## 2014-03-11 LAB — RAPID URINE DRUG SCREEN, HOSP PERFORMED
AMPHETAMINES: NOT DETECTED
BENZODIAZEPINES: NOT DETECTED
Barbiturates: NOT DETECTED
Cocaine: NOT DETECTED
Opiates: NOT DETECTED
TETRAHYDROCANNABINOL: NOT DETECTED

## 2014-03-11 LAB — BASIC METABOLIC PANEL
ANION GAP: 14 (ref 5–15)
BUN: 11 mg/dL (ref 6–23)
CALCIUM: 9.4 mg/dL (ref 8.4–10.5)
CHLORIDE: 101 meq/L (ref 96–112)
CO2: 26 mEq/L (ref 19–32)
CREATININE: 0.66 mg/dL (ref 0.50–1.10)
Glucose, Bld: 105 mg/dL — ABNORMAL HIGH (ref 70–99)
Potassium: 3.6 mEq/L — ABNORMAL LOW (ref 3.7–5.3)
Sodium: 141 mEq/L (ref 137–147)

## 2014-03-11 LAB — ACETAMINOPHEN LEVEL: Acetaminophen (Tylenol), Serum: <15 ug/mL (ref 10–30)

## 2014-03-11 LAB — SALICYLATE LEVEL: Salicylate Lvl: <2 mg/dL — ABNORMAL LOW (ref 2.8–20.0)

## 2014-03-11 LAB — ETHANOL

## 2014-03-11 MED ORDER — OLANZAPINE 10 MG PO TBDP
10.0000 mg | ORAL_TABLET | Freq: Two times a day (BID) | ORAL | Status: DC
  Administered 2014-03-11 – 2014-03-12 (×3): 10 mg via ORAL
  Filled 2014-03-11 (×3): qty 1

## 2014-03-11 MED ORDER — OLANZAPINE 10 MG PO TBDP
10.0000 mg | ORAL_TABLET | Freq: Once | ORAL | Status: AC
  Administered 2014-03-11: 10 mg via ORAL
  Filled 2014-03-11: qty 1

## 2014-03-11 NOTE — Progress Notes (Signed)
Follow-up calls have been placed to the following facilities: [facility]- per [facility] not on declined list so not reviewed yet [facility]- per [facility] referrals with their MD and will call once reviewed [facility]- per [facility] beds available but referrals have not been reviewed will call TTS once complete [facility]- per [facility] reviewing referrals and will call as they are complete [facility]- per Lawson Fiscal at [facility]- per [place] at [facility]- per Lynnell Dike will call TTS once referral has been reviewed  In addition the following facilities contacted are at capacity:  [facility]- per Morrie Sheldon  [facility]- per [facility]- per Cherene Julian- per Danae [facility]- per [facility]- per [facility]- per [facility]- per [facility]ia Chimes- per [facility]- per Driscilla Mo[facility]- [place]- per [facility]- per Christiane Ha only 1 bed available for developmentally disabled pt at this time   **pt declined at [facility] per [place], no appropriate bed.   Tomi Bamberger Disposition [facility]

## 2014-03-11 NOTE — Progress Notes (Signed)
Attempt to secure placement with the follow facilities:  Availabilty:  [facility]- [place], low acuity [facility]- [place], low acuity/[facility]- [place], low acuity [place]- Cassell Sm[facility]- Aundra Mil[facility], fax for tomorrow discharges [facility]- [place]- only child/adol [facility]- [place], low acuity [facility]- accept referrals for waitlist   At Capacity:  [facility]- [facility]- [facility]-no answer [facility]- [facility]- [facility]- [place]- Darrick Grinder- left message [facility]- [place]  Maryelizabeth Rowan, MSW, [employer], 03/11/2014 Evening Clinical Social Worker [phone]

## 2014-03-11 NOTE — BH Assessment (Signed)
  Per Abiodun pt has been accepted to [facility] under the care of Dr. Sara Chu and can arrive after 0830 on 03-12-14. Nurse can call report to [phone].  Informed Johny Drilling, RN.    Clista Bernhardt, [facility] Triage Specialist 03/11/2014 10:09 PM

## 2014-03-11 NOTE — ED Notes (Signed)
Dr Tenny Craw and shuvon np  Into see

## 2014-03-11 NOTE — ED Notes (Signed)
Up to the bathroom 

## 2014-03-11 NOTE — BH Assessment (Signed)
Requested IVC paperwork from [facility] ED.  [facility] has requested MAR and IVC paperwork. Sending paperwork as requested.   Clista Bernhardt, [facility] Triage Specialist 03/11/2014 11:19 PM
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
## 2014-03-11 NOTE — ED Provider Notes (Signed)
Medical screening examination/treatment/procedure(s) were conducted as a shared visit with non-physician practitioner(s) and myself.  I personally evaluated the patient during the encounter.  IVC First Exam form completed, 53 year old female was different he admits noncompliance refuses to take her psychiatric medications refused to see her psychiatrist reportedly is unable to care for self at home has ongoing paranoia with hallucinations and delusions and believes the government is trying to harm her she also has unnamed people that she wants to harm and will not divulge her plan or with those people are but denies suicidal ideation; is oriented to person place and time, nursing staff witnessed the patient hallucinating and talking to the walls in the emergency department.  TTS looking for placement.   Hurman Horn, MD 03/20/14 [phone]

## 2014-03-11 NOTE — BH Assessment (Signed)
Per Joann Glover, AC at [facility], adult unit is currently at capacity. Contacted the following facilities for placement:   BED AVAILABLE, FAXED CLINICAL INFORMATION:  [facility], per Kristy  [facility], per Sharita   AT CAPACITY:  [facility], per Calvin  [facility], per Michael  [facility], per Jackie  [facility], per Elva  [facility], per Lynn  [facility], per Kimberly  [facility], per Naquita  [facility], per Kathy  [facility], per Rita  [facility], per Heather  [facility], per Heather  [facility], per Susan  [facility], per Christina  [facility], per Jerry  [facility], per Claudine   NO RESPONSE:  [facility]    Naser Schuld Ellis Jamaris Biernat Jr, LPC, NCC  Triage Specialist  [phone]  

## 2014-03-11 NOTE — ED Notes (Signed)
Patient presents guarded but cooperative, denies any current SI/HI/AVH; patient states that she is not schizophrenic. NAD
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
## 2014-03-12 DIAGNOSIS — R443 Hallucinations, unspecified: Secondary | ICD-10-CM | POA: Diagnosis not present

## 2014-03-12 DIAGNOSIS — F29 Unspecified psychosis not due to a substance or known physiological condition: Secondary | ICD-10-CM | POA: Diagnosis not present

## 2014-03-12 DIAGNOSIS — Z720 Tobacco use: Secondary | ICD-10-CM | POA: Insufficient documentation

## 2014-03-12 DIAGNOSIS — Z9119 Patient's noncompliance with other medical treatment and regimen: Secondary | ICD-10-CM | POA: Diagnosis not present

## 2014-03-12 DIAGNOSIS — F200 Paranoid schizophrenia: Secondary | ICD-10-CM | POA: Diagnosis not present

## 2014-03-12 DIAGNOSIS — Z91199 Patient's noncompliance with other medical treatment and regimen due to unspecified reason: Secondary | ICD-10-CM | POA: Diagnosis not present

## 2014-03-12 DIAGNOSIS — F17200 Nicotine dependence, unspecified, uncomplicated: Secondary | ICD-10-CM | POA: Diagnosis present

## 2014-03-12 DIAGNOSIS — IMO0002 Reserved for concepts with insufficient information to code with codable children: Secondary | ICD-10-CM | POA: Diagnosis not present

## 2014-03-12 NOTE — ED Notes (Signed)
Pt. Discharged with [facility] after report to Art therapist at [facility]. Pt. Alert and ambulatory. Pts. Belongings sent with [employer].

## 2014-03-12 NOTE — ED Notes (Signed)
Report received from [facility]. Pt. Alert and oriented in no distress denies SI, HI, and pain. Pt. States she hears conversation without direction. Will continue to monitor for safety. Pt. Instructed to come to me with problems or concerns. Q 15 minute checks continue.

## 2014-03-12 NOTE — ED Notes (Addendum)
Report received from [facility]. Pt. Alert and oriented in no distress denies SI, HI, AVH and pain at this time. Will continue to monitor for safety. Pt. Instructed to come to me with problems or concerns. Q 15 minute checks continue.

## 2015-04-09 ENCOUNTER — Emergency Department (HOSPITAL_COMMUNITY)
Admission: EM | Admit: 2015-04-09 | Discharge: 2015-04-12 | Disposition: A | Discharge status: Other Acute Inpatient Hospital | Payer: Medicare Other | Attending: Emergency Medicine | Admitting: Emergency Medicine

## 2015-04-09 ENCOUNTER — Encounter (HOSPITAL_COMMUNITY): Payer: Self-pay | Admitting: Emergency Medicine

## 2015-04-09 DIAGNOSIS — F200 Paranoid schizophrenia: Secondary | ICD-10-CM | POA: Diagnosis present

## 2015-04-09 DIAGNOSIS — F1310 Sedative, hypnotic or anxiolytic abuse, uncomplicated: Secondary | ICD-10-CM | POA: Diagnosis not present

## 2015-04-09 DIAGNOSIS — Z720 Tobacco use: Secondary | ICD-10-CM | POA: Diagnosis not present

## 2015-04-09 DIAGNOSIS — F209 Schizophrenia, unspecified: Secondary | ICD-10-CM | POA: Diagnosis not present

## 2015-04-09 DIAGNOSIS — Z0000 Encounter for general adult medical examination without abnormal findings: Secondary | ICD-10-CM | POA: Diagnosis present

## 2015-04-09 DIAGNOSIS — F29 Unspecified psychosis not due to a substance or known physiological condition: Secondary | ICD-10-CM | POA: Insufficient documentation

## 2015-04-09 LAB — COMPREHENSIVE METABOLIC PANEL
ALT: 12 U/L — ABNORMAL LOW (ref 14–54)
ANION GAP: 12 (ref 5–15)
AST: 32 U/L (ref 15–41)
Albumin: 4.6 g/dL (ref 3.5–5.0)
Alkaline Phosphatase: 84 U/L (ref 38–126)
BUN: 14 mg/dL (ref 6–20)
CALCIUM: 9.6 mg/dL (ref 8.9–10.3)
CHLORIDE: 104 mmol/L (ref 101–111)
CO2: 23 mmol/L (ref 22–32)
Creatinine, Ser: 0.87 mg/dL (ref 0.44–1.00)
Glucose, Bld: 137 mg/dL — ABNORMAL HIGH (ref 65–99)
Potassium: 4 mmol/L (ref 3.5–5.1)
Sodium: 139 mmol/L (ref 135–145)
Total Bilirubin: 0.5 mg/dL (ref 0.3–1.2)
Total Protein: 8.7 g/dL — ABNORMAL HIGH (ref 6.5–8.1)

## 2015-04-09 LAB — CBC
HCT: 43.5 % (ref 36.0–46.0)
Hemoglobin: 14.6 g/dL (ref 12.0–15.0)
MCH: 27.8 pg (ref 26.0–34.0)
MCHC: 33.6 g/dL (ref 30.0–36.0)
MCV: 82.9 fL (ref 78.0–100.0)
PLATELETS: 317 10*3/uL (ref 150–400)
RBC: 5.25 MIL/uL — ABNORMAL HIGH (ref 3.87–5.11)
RDW: 13.6 % (ref 11.5–15.5)
WBC: 10.4 10*3/uL (ref 4.0–10.5)

## 2015-04-09 LAB — ACETAMINOPHEN LEVEL

## 2015-04-09 LAB — SALICYLATE LEVEL

## 2015-04-09 LAB — ETHANOL

## 2015-04-09 MED ORDER — ZOLPIDEM TARTRATE 5 MG PO TABS: 5.0000 mg | ORAL_TABLET | Freq: Every evening | ORAL | Status: DC | PRN

## 2015-04-09 MED ORDER — ONDANSETRON HCL 4 MG PO TABS: 4.0000 mg | ORAL_TABLET | Freq: Three times a day (TID) | ORAL | Status: DC | PRN

## 2015-04-09 MED ORDER — ALUM & MAG HYDROXIDE-SIMETH 200-200-20 MG/5ML PO SUSP: 30.0000 mL | ORAL | Status: DC | PRN

## 2015-04-09 MED ORDER — LORAZEPAM 2 MG/ML IJ SOLN
2.0000 mg | Freq: Once | INTRAMUSCULAR | Status: AC
  Administered 2015-04-09: 2 mg via INTRAMUSCULAR
  Filled 2015-04-09: qty 1

## 2015-04-09 MED ORDER — NICOTINE 21 MG/24HR TD PT24: 21.0000 mg | MEDICATED_PATCH | Freq: Every day | TRANSDERMAL | Status: DC

## 2015-04-09 MED ORDER — DIPHENHYDRAMINE HCL 50 MG/ML IJ SOLN
50.0000 mg | Freq: Once | INTRAMUSCULAR | Status: AC
  Administered 2015-04-09: 50 mg via INTRAMUSCULAR
  Filled 2015-04-09: qty 1

## 2015-04-09 MED ORDER — LORAZEPAM 1 MG PO TABS: 1.0000 mg | ORAL_TABLET | Freq: Three times a day (TID) | ORAL | Status: DC | PRN
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
## 2015-04-09 NOTE — ED Notes (Signed)
TTS Harriett Sine at bedside to eval pt, pt upset about being assessed by TTS.

## 2015-04-09 NOTE — ED Notes (Signed)
Bed: [facility] Expected date:  Expected time:  Means of arrival:  Comments: Hold for 15

## 2015-04-09 NOTE — ED Notes (Signed)
pts family member osiris Zazueta lennon in ED, gave copy of POA paperwork.  Requesting to be notified when if pt is transferred to another facility.  [name] [phone])  Requesting pt to go to [place], charge explained that may not be possible.   CODE WORD for medical information: TINA

## 2015-04-09 NOTE — Progress Notes (Signed)
Referral has been faxed to: [facility] - per Watt Climes, fax referral for review, beds open. [facility] - per Thayer Ohm, fax referral over for review. [facility] - per intake, fax referral for waitlist. [facility] - per Arley Phenix, fax referral for review, 1 bed open. [facility] - per intake, fax referral. [facility] - per intake, fax referral for review.  At capacity: [facility] - per Colin Rhein - per [facility] - per [facility] - per intake, child adolescent beds only.  Melbourne Abts, LCSWA Disposition staff 04/09/2015 10:41 PM

## 2015-04-09 NOTE — ED Notes (Signed)
Pt AAO x 3, no distress noted, calm & cooperative, resting at present, monitoring for safety, Q 15 min checks in effect. 

## 2015-04-09 NOTE — ED Notes (Signed)
Bed: UJ81 Expected date:  Expected time:  Means of arrival:  Comments: ivc
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
## 2015-04-09 NOTE — ED Notes (Signed)
Pt. Oriented to room and unit.  She denies SI, HI, and AVH.  When asked if she was seeing or hearing things she immediately said "Im not schizophrenic"  She appears sad and withdrawn and said she just had a bad day.  Pt. Was reassured of her safety.  15 minute checks and video monitoring are in place.

## 2015-04-09 NOTE — ED Notes (Addendum)
Limited response from IM medication-offered to lay down, appears to be getting a little sleepy-less agitated and not talking loudly-patient refused to lay down
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
## 2015-04-10 DIAGNOSIS — F200 Paranoid schizophrenia: Secondary | ICD-10-CM

## 2015-04-10 DIAGNOSIS — F29 Unspecified psychosis not due to a substance or known physiological condition: Secondary | ICD-10-CM | POA: Insufficient documentation

## 2015-04-10 LAB — RAPID URINE DRUG SCREEN, HOSP PERFORMED
AMPHETAMINES: NOT DETECTED
BENZODIAZEPINES: POSITIVE — AB
Barbiturates: NOT DETECTED
COCAINE: NOT DETECTED
Opiates: NOT DETECTED
Tetrahydrocannabinol: NOT DETECTED

## 2015-04-10 LAB — URINALYSIS, ROUTINE W REFLEX MICROSCOPIC
Bilirubin Urine: NEGATIVE
GLUCOSE, UA: NEGATIVE mg/dL
Ketones, ur: NEGATIVE mg/dL
Nitrite: NEGATIVE
PH: 6 (ref 5.0–8.0)
PROTEIN: NEGATIVE mg/dL
Specific Gravity, Urine: 1.025 (ref 1.005–1.030)
Urobilinogen, UA: 0.2 mg/dL (ref 0.0–1.0)

## 2015-04-10 LAB — URINE MICROSCOPIC-ADD ON

## 2015-04-10 MED ORDER — HYDROXYZINE HCL 25 MG PO TABS: 25.0000 mg | ORAL_TABLET | Freq: Three times a day (TID) | ORAL | Status: DC | PRN

## 2015-04-10 MED ORDER — CARBAMAZEPINE 200 MG PO TABS
200.0000 mg | ORAL_TABLET | Freq: Two times a day (BID) | ORAL | Status: DC
  Administered 2015-04-10 – 2015-04-12 (×5): 200 mg via ORAL
  Filled 2015-04-10 (×7): qty 1

## 2015-04-10 MED ORDER — TRAZODONE HCL 50 MG PO TABS: 50.0000 mg | ORAL_TABLET | Freq: Every evening | ORAL | Status: DC | PRN

## 2015-04-10 MED ORDER — RISPERIDONE 0.5 MG PO TABS
0.2500 mg | ORAL_TABLET | Freq: Two times a day (BID) | ORAL | Status: DC
  Administered 2015-04-10 – 2015-04-11 (×2): 0.25 mg via ORAL
  Filled 2015-04-10 (×2): qty 1

## 2015-04-10 NOTE — BH Assessment (Signed)
[facility] Assessment Progress Note  The following facilities have been contacted to seek placement for this pt, with results as noted:  Beds available, information sent, decision pending:  [facility]   At capacity:  [facility]   Thomas Hughes, MA Triage Specialist [phone]     
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
## 2015-04-10 NOTE — ED Notes (Signed)
Pt resting at present, no distress noted, monitoring for safety, Q 15 min checks in effect.
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
## 2015-04-11 MED ORDER — HYDROXYZINE HCL 25 MG PO TABS
25.0000 mg | ORAL_TABLET | Freq: Two times a day (BID) | ORAL | Status: DC
  Administered 2015-04-11 – 2015-04-12 (×3): 25 mg via ORAL
  Filled 2015-04-11 (×3): qty 1

## 2015-04-11 MED ORDER — RISPERIDONE 0.5 MG PO TABS
0.5000 mg | ORAL_TABLET | Freq: Two times a day (BID) | ORAL | Status: DC
  Administered 2015-04-11 – 2015-04-12 (×3): 0.5 mg via ORAL
  Filled 2015-04-11 (×3): qty 1

## 2015-04-11 NOTE — BHH Counselor (Addendum)
Pt was angry. Pt would not answer writer's questions. Pt was agitated. Pt states "I don't want to answer any questions."  Per Dr. Jannifer Franklin and Julieanne Cotton, NP Pt still meets inpatient criteria. TTS will continue to look for placement.   Wolfgang Phoenix, [facility] Triage Specialist

## 2015-04-11 NOTE — ED Notes (Signed)
Up to the bathroom 

## 2015-04-11 NOTE — ED Notes (Signed)
Patient alert and observed lying on stretcher with eyes open resting. Voices no concerns at this time. Monitoring for safety Q 15 minute checks in progress and maintained.

## 2015-04-11 NOTE — ED Notes (Addendum)
Pt's sister and guardian (papers on chart) [name] Yankey called and reports that the patient has been off of her meds  For the past 3-4 years.  She reports that the last time she took anything was last year.  She reports that she has done well until recently and has never been violent until this episode.  She also reports that the patient has not been eatting intermittantly if the food os bought by the family, but does eat of she (the pt) buys the food.  Sister also reports that the patient goes to [facility] in [place] and has been admitted their in the past.  She also reports that have been trying to work thru the [facility] so that they can help them with permanent placement. Will relay information-contact number for sister is --[phone]

## 2015-04-11 NOTE — Progress Notes (Signed)
CSW referred patient to the following hospitals in order to try and obtain inpatient placement:   [facility] Esmond Camper [phone] ED CSW 04/11/2015 7:47 PM
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
## 2015-04-12 NOTE — ED Notes (Signed)
Pt is alert and oriented.  She is isolating in room.  She denies SI, HI, and AVH.  Video monitoring and 15 minute checks in place.

## 2015-04-12 NOTE — ED Notes (Signed)
Report received from [facility] Pt. Sleeping, respirations regular and unlabored. Will continue to monitor for safety via security cameras and Q 15 minute checks.

## 2015-04-13 ENCOUNTER — Inpatient Hospital Stay (HOSPITAL_COMMUNITY)
Admission: AD | Admit: 2015-04-13 | Discharge: 2015-05-07 | DRG: 885 | Disposition: A | Discharge status: Home / Self Care | Payer: Medicare Other | Source: Intra-hospital | Attending: Psychiatry | Admitting: Psychiatry

## 2015-04-13 ENCOUNTER — Encounter (HOSPITAL_COMMUNITY): Payer: Self-pay | Admitting: *Deleted

## 2015-04-13 DIAGNOSIS — Z9114 Patient's other noncompliance with medication regimen: Secondary | ICD-10-CM

## 2015-04-13 DIAGNOSIS — F200 Paranoid schizophrenia: Principal | ICD-10-CM | POA: Diagnosis present

## 2015-04-13 DIAGNOSIS — F329 Major depressive disorder, single episode, unspecified: Secondary | ICD-10-CM | POA: Diagnosis present

## 2015-04-13 DIAGNOSIS — F17210 Nicotine dependence, cigarettes, uncomplicated: Secondary | ICD-10-CM | POA: Diagnosis present

## 2015-04-13 DIAGNOSIS — G4700 Insomnia, unspecified: Secondary | ICD-10-CM | POA: Diagnosis present

## 2015-04-13 DIAGNOSIS — Z9111 Patient's noncompliance with dietary regimen: Secondary | ICD-10-CM | POA: Diagnosis not present

## 2015-04-13 DIAGNOSIS — Z91148 Patient's other noncompliance with medication regimen for other reason: Secondary | ICD-10-CM | POA: Insufficient documentation

## 2015-04-13 MED ORDER — ACETAMINOPHEN 325 MG PO TABS: 650.0000 mg | ORAL_TABLET | Freq: Four times a day (QID) | ORAL | Status: DC | PRN

## 2015-04-13 MED ORDER — ALUM & MAG HYDROXIDE-SIMETH 200-200-20 MG/5ML PO SUSP: 30.0000 mL | ORAL | Status: DC | PRN

## 2015-04-13 MED ORDER — HYDROXYZINE HCL 25 MG PO TABS
25.0000 mg | ORAL_TABLET | Freq: Two times a day (BID) | ORAL | Status: DC
  Administered 2015-04-15 – 2015-04-16 (×2): 25 mg via ORAL
  Filled 2015-04-13 (×54): qty 1

## 2015-04-13 MED ORDER — RISPERIDONE 0.5 MG PO TABS
0.5000 mg | ORAL_TABLET | Freq: Two times a day (BID) | ORAL | Status: DC
  Filled 2015-04-13 (×5): qty 1

## 2015-04-13 MED ORDER — TRAZODONE HCL 50 MG PO TABS: 50.0000 mg | ORAL_TABLET | Freq: Every evening | ORAL | Status: DC | PRN

## 2015-04-13 MED ORDER — CARBAMAZEPINE 200 MG PO TABS
200.0000 mg | ORAL_TABLET | Freq: Two times a day (BID) | ORAL | Status: DC
  Administered 2015-04-14 – 2015-04-16 (×3): 200 mg via ORAL
  Filled 2015-04-13 (×11): qty 1

## 2015-04-13 MED ORDER — MAGNESIUM HYDROXIDE 400 MG/5ML PO SUSP: 30.0000 mL | Freq: Every day | ORAL | Status: DC | PRN

## 2015-04-13 NOTE — BHH Group Notes (Signed)
BHH LCSW Group Therapy  04/13/2015  1:15 PM  Type of Therapy:  Group Therapy  Participation Level:  Did Not Attend although encouraged by MHT  Summary of Progress/Problems: The main focus of today's process group was for the patient to identify ways in which they have in the past sabotaged their own recovery. Motivational Interviewing was utilized to encourage patient's to explore what they wish to change. The concept of HALT (Am I hungry, angry, lonely or tired?) was introduced as a means of establishing a habit of self care.     Catherine C Harrill, LCSW    

## 2015-04-13 NOTE — BHH Suicide Risk Assessment (Signed)
[facility] Admission Suicide Risk Assessment   Nursing information obtained from:  Patient, Review of record Demographic factors:  Low socioeconomic status, Unemployed Current Mental Status:  NA (Pt would not answer) Loss Factors:  NA (Pt refused to answer) Historical Factors:  NA (Pt refused to answer) Risk Reduction Factors:  NA (Pt refused to answer) Total Time spent with patient: 45 minutes Principal Problem: Schizophrenia, paranoid Diagnosis:   Patient Active Problem List   Diagnosis Date Noted  . Psychoses [F29]   . Hypokalemic alkalosis [E87.3] 11/11/2011  . Medically noncompliant [Z91.19] 11/11/2011  . Schizophrenia, paranoid [F20.0] 11/07/2011     Continued Clinical Symptoms:    The "Alcohol Use Disorders Identification Test", Guidelines for Use in Primary Care, Second Edition.  World Science writer [facility]). Score between 0-7:  no or low risk or alcohol related problems. Score between 8-15:  moderate risk of alcohol related problems. Score between 16-19:  high risk of alcohol related problems. Score 20 or above:  warrants further diagnostic evaluation for alcohol dependence and treatment.   CLINICAL FACTORS:   Severe Anxiety and/or Agitation Schizophrenia:   Paranoid or undifferentiated type Currently Psychotic Unstable or Poor Therapeutic Relationship Previous Psychiatric Diagnoses and Treatments   Musculoskeletal: Strength & Muscle Tone: within normal limits Gait & Station: normal Patient leans: [name]  Psychiatric Specialty Exam: Physical Exam Full physical performed in Emergency Department. I have reviewed this assessment and concur with its findings.   ROS patient is paranoid, delusional, refusing medications, restless, agitated and aggressive and reportedly combative at home  No Fever-chills, No Headache, No changes with Vision or hearing, reports vertigo No problems swallowing food or Liquids, No Chest pain, Cough or Shortness of Breath, No Abdominal pain,  No Nausea or Vommitting, Bowel movements are regular, No Blood in stool or Urine, No dysuria, No new skin rashes or bruises, No new joints pains-aches,  No new weakness, tingling, numbness in any extremity, No recent weight gain or loss, No polyuria, polydypsia or polyphagia,  A full 10 point Review of Systems was done, except as stated above, all other Review of Systems were negative.  Blood pressure 99/66, pulse 72, temperature 98.2 F (36.8 C), temperature source Oral, resp. rate 16, height  (1.6 m), weight 63.957 kg (141 lb).Body mass index is 24.98 kg/(m^2).  General Appearance: Bizarre, Disheveled and Guarded  Eye Contact::  Fair  Speech:  Pressured  Volume:  Increased  Mood:  Angry and Irritable  Affect:  Non-Congruent and Inappropriate  Thought Process:  Disorganized, Irrelevant and Loose  Orientation:  Full (Time, Place, and Person)  Thought Content:  Delusions, Paranoid Ideation and Rumination  Suicidal Thoughts:  No  Homicidal Thoughts:  No  Memory:  Immediate;   Fair Recent;   Fair  Judgement:  Poor  Insight:  Lacking  Psychomotor Activity:  Restlessness  Concentration:  Fair  Recall:  [employer] of Knowledge:Fair  Language: Fair  Akathisia:  Negative  Handed:  Right  AIMS (if indicated):     Assets:  Communication Skills Desire for Improvement Financial Resources/Insurance Housing Leisure Time Physical Health Resilience Social Support  Sleep:  Number of Hours: 5.5  Cognition: WNL  ADL's:  Intact     COGNITIVE FEATURES THAT CONTRIBUTE TO RISK:  Closed-mindedness, Loss of executive function, Polarized thinking and Thought constriction (tunnel vision)    SUICIDE RISK:   Mild:  Suicidal ideation of limited frequency, intensity, duration, and specificity.  There are no identifiable plans, no associated intent, mild dysphoria and  related symptoms, good self-control (both objective and subjective assessment), few other risk factors, and identifiable  protective factors, including available and accessible social support.  PLAN OF CARE: Patient admitted for inpatient exam is regular symptoms of paranoid schizophrenia, increased agitation, combative behavior, restlessness, yelling and screaming and noncompliant with outpatient medication management. Patient is not able to care for herself needed crisis stabilization, safety monitoring and medication management. Patient is also needed forced medication as she is refusing all medications including food than water intake. We will obtained a second opinion from another MD for forced medication as required by the state.  Medical Decision Making:  Review of Psycho-Social Stressors (1), Review or order clinical lab tests (1), Established Problem, Worsening (2), Review of Last Therapy Session (1), Review or order medicine tests (1), Review of Medication Regimen & Side Effects (2) and Review of New Medication or Change in Dosage (2)  I certify that inpatient services furnished can reasonably be expected to improve the patient's condition.   Anagabriela Jokerst,JANARDHAHA R. 04/13/2015, 3:24 PM
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
## 2015-04-13 NOTE — Progress Notes (Signed)
B[facility] Group Notes:  (Nursing/MHT/Case Management/Adjunct)  Date:  04/13/2015  Time:  9:01 PM  Type of Therapy:  Psychoeducational Skills  Participation Level:  None  Participation Quality:  Resistant  Affect:  Irritable  Cognitive:  Lacking  Insight:  None  Engagement in Group:  None and Resistant  Modes of Intervention:  Education  Summary of Progress/Problems: The patient verbalized that she had a "so so" day and refused to share any additional details. She refused to share what her coping skill is (theme of the day).   Hazle Coca S 04/13/2015, 9:01 PM

## 2015-04-13 NOTE — BHH Group Notes (Signed)
B[facility] Group Notes:  Coping Skills  Date:  04/13/2015  Time:  11:02 AM  Type of Therapy:  Nurse Education  Participation Level:  Did Not Attend  Participation Quality:  Inattentive  Affect:  Flat  Cognitive:  Lacking  Insight:  None  Engagement in Group:  None  Modes of Intervention:  Discussion  Summary of Progress/Problems:pt remains in her room  Rodman Key [facility] 04/13/2015, 11:02 AM

## 2015-04-13 NOTE — Progress Notes (Addendum)
Pt remains in her room and refuses to take any of her am medication. Pt stated,"I do not need any medicine." Pt has been isolating most of the am. She does contract for safety and denies SI and HI. 2p- MD went in pt room and pt began screaming at her.MD aware pt will not take any of her meds. At this time no forced meds have been ordered.  Pt does not feel she needs any medication at this time. MD was here to do a second opinion.Pt did go to the cafeteria for lunch. 5p-Pt continues to refuse meds. MD aware. Pt did go to dinner this pm. Pt continues  to isolate in her room and remains non confrontational. Pt has not attended any groups today. Pt has been polite with the staff today.
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
## 2015-04-14 MED ORDER — HALOPERIDOL LACTATE 5 MG/ML IJ SOLN: 5.0000 mg | Freq: Two times a day (BID) | INTRAMUSCULAR | Status: DC | PRN

## 2015-04-14 MED ORDER — HALOPERIDOL 5 MG PO TABS: 5.0000 mg | ORAL_TABLET | Freq: Two times a day (BID) | ORAL | Status: DC | PRN

## 2015-04-14 MED ORDER — HALOPERIDOL LACTATE 5 MG/ML IJ SOLN
5.0000 mg | Freq: Two times a day (BID) | INTRAMUSCULAR | Status: DC
  Filled 2015-04-14: qty 1

## 2015-04-14 MED ORDER — HALOPERIDOL 5 MG PO TABS
5.0000 mg | ORAL_TABLET | Freq: Two times a day (BID) | ORAL | Status: DC
  Filled 2015-04-14 (×2): qty 1

## 2015-04-14 MED ORDER — BENZTROPINE MESYLATE 1 MG PO TABS
ORAL_TABLET | ORAL | Status: AC
Start: 2015-04-14 — End: 2015-04-14
  Filled 2015-04-14: qty 1

## 2015-04-14 MED ORDER — BENZTROPINE MESYLATE 1 MG PO TABS
1.0000 mg | ORAL_TABLET | Freq: Two times a day (BID) | ORAL | Status: DC
  Filled 2015-04-14 (×4): qty 1

## 2015-04-14 NOTE — [facility] Group Notes (Signed)
[facility] LCSW Group Therapy  04/14/2015   11:00 AM   Type of Therapy:  Group Therapy  Participation Level:  Minimal  Participation Quality:  Minimal  Affect:  Disorganized  Cognitive:   Responding to internal stimuli  Insight:  Limited, lacking  Engagement in Therapy:  Limited, lacking  Modes of Intervention:  Clarification, Confrontation, Discussion, Education, Exploration, Limit-setting, Orientation, Problem-solving, Rapport Building, Dance movement psychotherapist, Socialization and Support  Summary of Progress/Problems: The main focus of today's process group was to identify the patient's current support system and decide on other supports that can be put in place.  An emphasis was placed on using counselor, doctor, therapy groups, 12-step groups, and problem-specific support groups to expand supports, as well as doing something different than has been done before.  Pt came to group late.  Pt states that she didn't want to talk in group as she didn't understand why she was there.  Pt was observed talking to herself and appeared guarded/suspicious of the group.    Reyes Ivan, LCSW 04/14/2015 1:09 PM

## 2015-04-14 NOTE — Progress Notes (Signed)
Patient ID: [name], female   DOB: 1961/05/15, 54 y.o.   MRN: [id number] Unsuccessful attempt to gather information with pt for PSA at 11:00 AM as she was adamant she was not interested in a conversation at the time.  Carney Bern, LCSW

## 2015-04-14 NOTE — Progress Notes (Signed)
Did not attend group 

## 2015-04-14 NOTE — [facility] Group Notes (Signed)
[facility] Group Notes:  Healthy support systems  Date:  04/14/2015  Time:  11:06 AM  Type of Therapy:  Nurse Education  Participation Level:  Did Not Attend  Participation Quality:  Inattentive  Affect:  Depressed  Cognitive:  Lacking  Insight:  None  Engagement in Group:  Lacking  Modes of Intervention:  Discussion  Summary of Progress/Problems:pt remained in her room  [facility] 04/14/2015, 11:06 AM

## 2015-04-14 NOTE — Progress Notes (Addendum)
Pt continues to refuse to take her medications. She stated she took all she needed across the street and that the medications cost a lot of money. Pt does appear clearer today . She did take a shower and requested that her clothes be washed this am. Pt continues to isolate in her room and does not engage in conversation with any other pts. Pt has been walking to the cafeteria for all her meals. She contracts for safety and denies Si and HI. Pt rates her depression/anxiety and hopelessness a 0/10.10:20a-Spoke with Dr Shela Commons. Concerning pt requesting not to have toxins in her body. Pt has been very cooperative this am . She folded all her dirty clothes and cleaned her room. Per Dr. Shela Commons if the pt has any outburst she is to get forced medications. For now the pt remains very clam and cooperative. She does appear limited at times. 11:40am- Pt is very quiet and cooperative. She remains in her room. 11:50a-Pt is sitting in the dayroom with the other pts. She remains calm.

## 2015-04-14 NOTE — Progress Notes (Signed)
[facility] MD Progress Note  04/14/2015  [name]  MRN:  [id number] Subjective:  Pt states: "I don't want to talk to anyone! Get out of my room! I'm not going to be poisoned by that medication!"  Objective: Pt seen and chart reviewed. Pt continues to present as paranoid, agitated, and verbally hostile, although NOT physically so at any point in time. She reports that she does not believe in medication as they may be poisonous. Pt refuses to answer any questions.   Principal Problem: Schizophrenia, paranoid Diagnosis:   Patient Active Problem List   Diagnosis Date Noted  . Schizophrenia, paranoid [F20.0] 11/07/2011    Priority: High  . Noncompliance with diet and medication regimen [Z91.11, Z91.14]   . Psychoses [F29]   . Hypokalemic alkalosis [E87.3] 11/11/2011  . Medically noncompliant [Z91.19] 11/11/2011   Total Time spent with patient: 15 minutes   Past Medical History:  Past Medical History  Diagnosis Date  . Schizophrenia   . Non compliance w medication regimen    History reviewed. No pertinent past surgical history. Family History: History reviewed. No pertinent family history. Social History:  History  Alcohol Use  . Yes    Comment: Refuses to disclose how much     History  Drug Use No    Comment: Refuses to answer    Social History   Social History  . Marital Status: Single    Spouse Name: [name]  . Number of Children: [name]  . Years of Education: [name]   Social History Main Topics  . Smoking status: Current Every Day Smoker  . Smokeless tobacco: None  . Alcohol Use: Yes     Comment: Refuses to disclose how much  . Drug Use: No     Comment: Refuses to answer  . Sexual Activity: Not Asked     Comment: refused to answer   Other Topics Concern  . None   Social History Narrative   Additional History:    Sleep: Poor  Appetite:  Fair   Assessment:   Musculoskeletal: Strength & Muscle Tone: within normal limits Gait & Station: normal Patient leans:  [name]   Psychiatric Specialty Exam: Physical Exam  Review of Systems  Psychiatric/Behavioral: Positive for hallucinations.  All other systems reviewed and are negative.   Blood pressure 106/69, pulse 64, temperature 98.5 F (36.9 C), temperature source Oral, resp. rate 19, height  (1.6 m), weight 63.957 kg (141 lb).Body mass index is 24.98 kg/(m^2).  General Appearance: Bizarre, Disheveled and Guarded  Eye Contact:: Absent  Speech: Pressured  Volume: Increased  Mood: Angry, Anxious, Dysphoric and Irritable  Affect: Inappropriate and Labile  Thought Process: Circumstantial and Disorganized  Orientation: Other: Self  Thought Content: Hallucinations: Responding to internal stimuli; very paranoid  Suicidal Thoughts: UTO, pt will not answer  Homicidal Thoughts: UTO, pt will not answer  Memory: UTO, pt will not answer  Judgement: Impaired  Insight: Lacking  Psychomotor Activity: Increased  Concentration: Poor  Recall: Poor  Fund of Knowledge:Poor  Language: Poor  Akathisia: No  Handed:   AIMS (if indicated):    Assets: Resilience  ADL's: Impaired  Cognition: WNL           Sleep:  Number of Hours: 6.25     Current Medications: Current Facility-Administered Medications  Medication Dose Route Frequency Provider Last Rate Last Dose  . acetaminophen (TYLENOL) tablet 650 mg  650 mg Oral Q6H PRN Charm Rings, NP      . alum & Jodelle Green  hydroxide-simeth (MAALOX/MYLANTA) 200-200-20 MG/5ML suspension 30 mL  30 mL Oral Q4H PRN Charm Rings, NP      . benztropine (COGENTIN) 1 MG tablet           . benztropine (COGENTIN) tablet 1 mg  1 mg Oral BID Leata Mouse, MD   1 mg at 04/14/15 1029  . carbamazepine (TEGRETOL) tablet 200 mg  200 mg Oral BID PC Charm Rings, NP   200 mg at 04/14/15 1747  . haloperidol (HALDOL) tablet 5 mg  5 mg Oral BID PRN Leata Mouse, MD       Or  . haloperidol lactate (HALDOL)  injection 5 mg  5 mg Intramuscular BID PRN Leata Mouse, MD      . hydrOXYzine (ATARAX/VISTARIL) tablet 25 mg  25 mg Oral BID PC Charm Rings, NP   25 mg at 04/13/15 4098  . magnesium hydroxide (MILK OF MAGNESIA) suspension 30 mL  30 mL Oral Daily PRN Charm Rings, NP      . traZODone (DESYREL) tablet 50 mg  50 mg Oral QHS PRN Charm Rings, NP        Lab Results: No results found for this or any previous visit (from the past 48 hour(s)).  Physical Findings: AIMS: Facial and Oral Movements Muscles of Facial Expression: None, normal Lips and Perioral Area: None, normal Jaw: None, normal Tongue: None, normal,Extremity Movements Upper (arms, wrists, hands, fingers): None, normal Lower (legs, knees, ankles, toes): None, normal, Trunk Movements Neck, shoulders, hips: None, normal, Overall Severity Severity of abnormal movements (highest score from questions above): None, normal Incapacitation due to abnormal movements: None, normal Patient's awareness of abnormal movements (rate only patient's report): No Awareness, Dental Status Current problems with teeth and/or dentures?: No Does patient usually wear dentures?: No  CIWA:    COWS:     Treatment Plan Summary: Daily contact with patient to assess and evaluate symptoms and progress in treatment and Medication management  Medications:  -Continue cogentin  bid for EPS prophylaxis -Continue Tegretol  bid for psychosis -Continue Haldol  PO or IM for agitation bid -Continue Trazodone  qhs prn insomnia  *Nursing staff instructed to do forced meds as earlier instructed by Dr. Elsie Saas (per nurse Marylu Lund) and based on the PRN agitation orders he put in EPIC. This provider also spoke to nurse Marylu Lund and instructed her to utilize these orders if pt becomes agitated or aggressive.   Medical Decision Making:  New problem, with additional work up planned, Review of Psycho-Social Stressors (1), Review or order  clinical lab tests (1), Review of Medication Regimen & Side Effects (2) and Review of New Medication or Change in Dosage (2)   [place], Everardo All, FNP-BC 04/14/2015, 5:38 PM  Reviewed the information documented and agree with the treatment plan.  JONNALAGADDA,JANARDHAHA R. 04/16/2015 10:03 AM

## 2015-04-14 NOTE — Progress Notes (Signed)
  D: Patient withdrawn and seclusive to her room for the majority of the shift. Pt did, however, attend group but did not engage. Pt irritable and becoming agitated with RN's questions and assessment. Pt dismissive and stating "Just leave me alone, I am fine. Go away". Pt refusing snacks when offered. A: Q 15 minute safety checks, encourage staff/peer interaction and group participation. Administer medications as ordered by MD.  R: Pt denies SI/HI or hallucinations, but was very reluctant to speak to RN. Pt only shaking her head yes or no with questions.

## 2015-04-15 MED ORDER — HALOPERIDOL 5 MG PO TABS
5.0000 mg | ORAL_TABLET | Freq: Two times a day (BID) | ORAL | Status: DC
  Administered 2015-04-15 – 2015-04-16 (×3): 5 mg via ORAL
  Filled 2015-04-15 (×9): qty 1

## 2015-04-15 MED ORDER — HALOPERIDOL 5 MG PO TABS: 5.0000 mg | ORAL_TABLET | Freq: Three times a day (TID) | ORAL | Status: DC | PRN

## 2015-04-15 MED ORDER — BENZTROPINE MESYLATE 0.5 MG PO TABS
0.5000 mg | ORAL_TABLET | Freq: Two times a day (BID) | ORAL | Status: DC
  Administered 2015-04-15 – 2015-04-16 (×3): 0.5 mg via ORAL
  Filled 2015-04-15 (×21): qty 1

## 2015-04-15 MED ORDER — BENZTROPINE MESYLATE 1 MG/ML IJ SOLN
0.5000 mg | Freq: Two times a day (BID) | INTRAMUSCULAR | Status: DC
  Administered 2015-04-16 – 2015-04-22 (×12): 0.5 mg via INTRAMUSCULAR
  Filled 2015-04-15 (×6): qty 0.5
  Filled 2015-04-15: qty 2
  Filled 2015-04-15: qty 0.5
  Filled 2015-04-15: qty 2
  Filled 2015-04-15 (×12): qty 0.5

## 2015-04-15 MED ORDER — DIPHENHYDRAMINE HCL 25 MG PO CAPS: 25.0000 mg | ORAL_CAPSULE | Freq: Three times a day (TID) | ORAL | Status: DC | PRN

## 2015-04-15 MED ORDER — HALOPERIDOL LACTATE 5 MG/ML IJ SOLN
5.0000 mg | Freq: Three times a day (TID) | INTRAMUSCULAR | Status: DC | PRN
Start: 2015-04-15 — End: 2015-04-30

## 2015-04-15 MED ORDER — LORAZEPAM 2 MG/ML IJ SOLN: 1.0000 mg | Freq: Three times a day (TID) | INTRAMUSCULAR | Status: DC | PRN

## 2015-04-15 MED ORDER — DIPHENHYDRAMINE HCL 50 MG/ML IJ SOLN: 25.0000 mg | Freq: Three times a day (TID) | INTRAMUSCULAR | Status: DC | PRN

## 2015-04-15 MED ORDER — HALOPERIDOL LACTATE 5 MG/ML IJ SOLN
5.0000 mg | Freq: Two times a day (BID) | INTRAMUSCULAR | Status: DC
  Filled 2015-04-15 (×7): qty 1

## 2015-04-15 MED ORDER — LORAZEPAM 1 MG PO TABS: 1.0000 mg | ORAL_TABLET | Freq: Three times a day (TID) | ORAL | Status: DC | PRN

## 2015-04-15 NOTE — Progress Notes (Signed)
D:Pt is isolative to her room with a foul body odor. Pt was encouraged to shower and refused stating that she had a shower yesterday. Pt's mood is labile refusing medications at times. Pt took her haldol and cogentin "as long as it was certified by [employer]." Pt reports no depression or anxiety. A:Offered support, encouragement and 15 minute checks. R:Safety maintained on the unit.

## 2015-04-15 NOTE — Progress Notes (Signed)
  D: Patient isolative to her room. Pt declines to talk to RN during assessment, other than simple yes and no answers. Pt with poor eye contact. Pt noted to be malodorous and disheveled; Pt declined to take a shower as suggested by RN. Pt with no interaction with peers and was not noted to be out in milieu. Pt irritable with staff. A: Q 15 minute safety checks, encourage group participation and staff/peer interaction. Administer medications as ordered by MD. R: No s/s of distress noted.
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
## 2015-04-15 NOTE — [facility] Group Notes (Signed)
[facility] LCSW Group Therapy  04/15/2015 3:42 PM   Type of Therapy:  Group Therapy  Participation Level:  Active  Participation Quality:  Attentive  Affect:  Appropriate  Cognitive:  Appropriate  Insight:  Improving  Engagement in Therapy:  Engaged  Modes of Intervention:  Clarification, Education, Exploration and Socialization  Summary of Progress/Problems: Today's group focused on resilience.  We defined the term, and then identified examples from the past.  Elonna stayed the entire time, and appeared engaged, though she gave little direct eye contact.  She did not offer anything spontaneously, but answered when asked directly.  Although she was speaking literally, I took her contributions figuratively, which allowed me to work them into the theme.  She talked about how you have to master writing and speaking schools in high school so you can go on and be successful in life.  "They drill you a lot at [employer] so you can get better.  And if you are not good at math, you have to focus on english." We used this to talk about the importance of practice, and focusing on strengths rather than weaknesses.    [name] B 04/15/2015 , 3:42 PM

## 2015-04-15 NOTE — Progress Notes (Addendum)
[facility] MD Progress Note  04/15/2015  Tametra Ahart  MRN:  [id number] Subjective:  Pt states: "I do not need any medications. Its the government that is after me , they are networking in my house .'   Objective: Pt seen and chart reviewed.I have also reviewed notes in EHR from previous days . Patient is a 29 y old AAF with hx of schizophrenia , brought in [facility] for aggressive , disorganized behavior at home. Pt per initial notes - was aggressive with family , paranoid and delusional about cable guy who came to her house . Pt today continues to be very labile , disorganized , her voice is loud , seen as yelling at Clinical research associate , when Clinical research associate attempted to evaluate her. She lacks insight in to her illness and feels that the government is trying to hurt her and has networking at her home . Pt continues to be delusional, persecutory type and seems to be very labile. Pt currently on Forced medication order - started on the week end since she has been refusing treatment.   Principal Problem: Schizophrenia, paranoid Diagnosis:   Patient Active Problem List   Diagnosis Date Noted  . Noncompliance with diet and medication regimen [Z91.11, Z91.14]   . Psychoses [F29]   . Hypokalemic alkalosis [E87.3] 11/11/2011  . Medically noncompliant [Z91.19] 11/11/2011  . Schizophrenia, paranoid [F20.0] 11/07/2011   Total Time spent with patient: 25 minutes   Past Medical History:  Past Medical History  Diagnosis Date  . Schizophrenia   . Non compliance w medication regimen     Family History: Pt did not express any hx of mental illness in family.  Social History:  History  Alcohol Use  . Yes    Comment: Refuses to disclose how much     History  Drug Use No    Comment: Refuses to answer    Social History   Social History  . Marital Status: Single    Spouse Name: [name]  . Number of Children: [name]  . Years of Education: [name]   Social History Main Topics  . Smoking status: Current Every Day Smoker  .  Smokeless tobacco: None  . Alcohol Use: Yes     Comment: Refuses to disclose how much  . Drug Use: No     Comment: Refuses to answer  . Sexual Activity: Not Asked     Comment: refused to answer   Other Topics Concern  . None   Social History Narrative   Additional History:    Sleep: pt did not report any sleep issues  Appetite:  Fair    Musculoskeletal: Strength & Muscle Tone: within normal limits Gait & Station: normal Patient leans: [name]   Psychiatric Specialty Exam: Physical Exam  Review of Systems  Unable to perform ROS: mental acuity    Blood pressure 112/76, pulse 54, temperature 98 F (36.7 C), temperature source Oral, resp. rate 16, height  (1.6 m), weight 63.957 kg (141 lb).Body mass index is 24.98 kg/(m^2).  General Appearance: Bizarre, Disheveled and Guarded  Eye Contact:: fair  Speech: Pressured  Volume: Increased  Mood: Angry, Anxious, Dysphoric and Irritable  Affect: Inappropriate and Labile  Thought Process: Circumstantial and Disorganized, tangential at times  Orientation: Other: Self  Thought Content: Hallucinations: Responding to internal stimuli; very paranoid  Suicidal Thoughts: pt did not express any  Homicidal Thoughts:unable to assess  Memory:unable to assess  Judgement: Impaired  Insight: Lacking  Psychomotor Activity: Increased  Concentration: Poor  Recall: Poor  Fund of Knowledge:Poor  Language: Poor  Akathisia: No  Handed:   AIMS (if indicated):    Assets:access to health care  ADL's: Impaired  Cognition: WNL           Sleep:  Number of Hours: 4.75     Current Medications: Current Facility-Administered Medications  Medication Dose Route Frequency Provider Last Rate Last Dose  . acetaminophen (TYLENOL) tablet 650 mg  650 mg Oral Q6H PRN Charm Rings, NP      . alum & mag hydroxide-simeth (MAALOX/MYLANTA) 200-200-20 MG/5ML suspension 30 mL  30 mL Oral Q4H PRN Charm Rings, NP      . benztropine (COGENTIN) tablet 0.5 mg  0.5 mg Oral BID Jomarie Longs, MD   0.5 mg at 04/15/15 1044   Or  . benztropine mesylate (COGENTIN) injection 0.5 mg  0.5 mg Intramuscular BID Jomarie Longs, MD      . carbamazepine (TEGRETOL) tablet 200 mg  200 mg Oral BID PC Charm Rings, NP   200 mg at 04/14/15 1747  . diphenhydrAMINE (BENADRYL) capsule 25 mg  25 mg Oral Q8H PRN Jomarie Longs, MD       Or  . diphenhydrAMINE (BENADRYL) injection 25 mg  25 mg Intramuscular Q8H PRN Saramma Eappen, MD      . haloperidol (HALDOL) tablet 5 mg  5 mg Oral Q8H PRN Jomarie Longs, MD       Or  . haloperidol lactate (HALDOL) injection 5 mg  5 mg Intramuscular Q8H PRN Saramma Eappen, MD      . haloperidol (HALDOL) tablet 5 mg  5 mg Oral BID Jomarie Longs, MD   5 mg at 04/15/15 1044   Or  . haloperidol lactate (HALDOL) injection 5 mg  5 mg Intramuscular BID Jomarie Longs, MD      . hydrOXYzine (ATARAX/VISTARIL) tablet 25 mg  25 mg Oral BID PC Charm Rings, NP   25 mg at 04/13/15 1610  . LORazepam (ATIVAN) tablet 1 mg  1 mg Oral Q8H PRN Jomarie Longs, MD       Or  . LORazepam (ATIVAN) injection 1 mg  1 mg Intramuscular Q8H PRN Saramma Eappen, MD      . magnesium hydroxide (MILK OF MAGNESIA) suspension 30 mL  30 mL Oral Daily PRN Charm Rings, NP      . traZODone (DESYREL) tablet 50 mg  50 mg Oral QHS PRN Charm Rings, NP        Lab Results: No results found for this or any previous visit (from the past 48 hour(s)).  Physical Findings: AIMS: Facial and Oral Movements Muscles of Facial Expression: None, normal Lips and Perioral Area: None, normal Jaw: None, normal Tongue: None, normal,Extremity Movements Upper (arms, wrists, hands, fingers): None, normal Lower (legs, knees, ankles, toes): None, normal, Trunk Movements Neck, shoulders, hips: None, normal, Overall Severity Severity of abnormal movements (highest score from questions above): None, normal Incapacitation due to  abnormal movements: None, normal Patient's awareness of abnormal movements (rate only patient's report): No Awareness, Dental Status Current problems with teeth and/or dentures?: No Does patient usually wear dentures?: No  CIWA:    COWS:     Assessment: Pt is a 7 y old AAF who has a hx of schizophrenia , presented very disorganized with persecutory delusions as well as aggressive behavior. Pt continues to be aggressive, gets agitated when attempted to speak to her - Currently on Forced medication order- will continue treatment.  Treatment Plan Summary: Daily contact with patient to assess and evaluate symptoms and progress in treatment and Medication management  Medications:  -Start Haldol 5 mg po/im bid for psychosis , Cogentin 0.5 mg po/IM for EPS . Pt is on Forced medication order. -Continue Tegretol  bid for psychosis -Continue Haldol  PO or IM q8h prn for agitation. -Continue Benadryl 25 mg PO/IM (EPS), Ativan 1 mg po/IM q8h prn for agitation . -Continue Trazodone  qhs prn insomnia -Will get EKG , TSH , lipid panel, Hba1c, PL level if not already done.  Medical Decision Making:  New problem, with additional work up planned, Review of Psycho-Social Stressors (1), Review or order clinical lab tests (1), Review of Medication Regimen & Side Effects (2) and Review of New Medication or Change in Dosage (2)   Eappen,Saramma, MD 04/15/2015, 1;05 PM

## 2015-04-15 NOTE — Plan of Care (Signed)
Problem: Ineffective individual coping Goal: STG: Pt will be able to identify effective and ineffective STG: Pt will be able to identify effective and ineffective coping patterns  Outcome: Progressing Pt is not participating in group therapy, labile and refuses medications at times. Goal: STG: Patient will remain free from self harm Outcome: Progressing No self harm thoughts reported or observed.

## 2015-04-15 NOTE — Progress Notes (Signed)
Patient ID: [name], female   DOB: 1960/09/23, 54 y.o.   MRN: [id number]  PER STATE REGULATIONS 482.30  THIS CHART WAS REVIEWED FOR MEDICAL NECESSITY WITH RESPECT TO THE PATIENT'S ADMISSION/DURATION OF STAY.  NEXT REVIEW DATE:04/16/15 Loura Halt, RN, BSN CASE MANAGER

## 2015-04-15 NOTE — Progress Notes (Signed)
Patient lacks capacity to participate in disposition planning.   Saramma Eappen ,MD [facility]        

## 2015-04-15 NOTE — Tx Team (Signed)
Interdisciplinary Treatment Plan Update (Adult)  Date:  04/15/2015   Time Reviewed:  5:33 PM   Progress in Treatment: Attending groups: Yes. Participating in groups:  Yes. Taking medication as prescribed:  Yes. Tolerating medication:  Yes. Family/Significant other contact made:  Yes Patient understands diagnosis:  No  Limited insight Discussing patient identified problems/goals with staff:  Yes, see initial care plan. Medical problems stabilized or resolved:  Yes. Denies suicidal/homicidal ideation: Yes. Issues/concerns per patient self-inventory:  No. Other:  New problem(s) identified:  Discharge Plan or Barriers:  Transfer to [facility]  Reason for Continuation of Hospitalization: Delusions  Medication stabilization Other; describe Paranoia  Comments:   [name] is a 54 y.o. female history of schizophrenia here presenting with agitation and aggressive behavior. The cable company was at her house earlier and she became aggressive towards her as well as towards her mother. She was called and she was IVC. On exam, patient refused to answer questions and feels that everyone is here to kill her and are evil. I was unable to examine her because she refused. She was given Ativan and Benadryl for agitation.  Second opinion delivered to force meds as pt was refusing.  She is now taking Haldol, Tegretol PO Estimated length of stay: 4-5 days New goal(s):  Review of initial/current patient goals per problem list:   Review of initial/current patient goals per problem list:  1. Goal(s): Patient will participate in aftercare plan   Met: No   Target date: 3-5 days post admission date   As evidenced by: Patient will participate within aftercare plan AEB aftercare provider and housing plan at discharge being identified.  04/15/2015: Working on referral to [facility] per request of guardian        5. Goal(s): Patient will demonstrate decreased signs of psychosis  * Met:  No  * Target date: 3-5 days post admission date  * As evidenced by: Patient will demonstrate decreased frequency of AVH or return to baseline function 04/15/15   Presents with paranoia, disorganization, mood lability due to non compliance with meds, oupt tx         Attendees: Patient:  04/15/2015 5:33 PM   Family:   04/15/2015 5:33 PM   Physician:  Ursula Alert, MD 04/15/2015 5:33 PM   Nursing:   Gaylan Gerold, RN 04/15/2015 5:33 PM   CSW:    Roque Lias, [place]   04/15/2015 5:33 PM   Other:  04/15/2015 5:33 PM   Other:   04/15/2015 5:33 PM   Other:  Lars Pinks, Nurse CM 04/15/2015 5:33 PM   Other:  Lucinda Dell, Beverly Sessions TCT 04/15/2015 5:33 PM   Other:  Norberto Sorenson, [place]  04/15/2015 5:33 PM   Other:  04/15/2015 5:33 PM   Other:  04/15/2015 5:33 PM   Other:  04/15/2015 5:33 PM   Other:  04/15/2015 5:33 PM   Other:  04/15/2015 5:33 PM   Other:   04/15/2015 5:33 PM    Scribe for Treatment Team:   Trish Mage, 04/15/2015 5:33 PM

## 2015-04-16 LAB — TSH: TSH: 1.767 u[IU]/mL (ref 0.350–4.500)

## 2015-04-16 LAB — LIPID PANEL
CHOL/HDL RATIO: 3.4 ratio
CHOLESTEROL: 186 mg/dL (ref 0–200)
HDL: 55 mg/dL (ref >40)
LDL Cholesterol: 118 mg/dL — ABNORMAL HIGH (ref 0–99)
TRIGLYCERIDES: 64 mg/dL (ref <150)
VLDL: 13 mg/dL (ref 0–40)

## 2015-04-16 MED ORDER — HALOPERIDOL LACTATE 5 MG/ML IJ SOLN
7.5000 mg | Freq: Two times a day (BID) | INTRAMUSCULAR | Status: DC
  Administered 2015-04-16 – 2015-04-17 (×2): 7.5 mg via INTRAMUSCULAR
  Filled 2015-04-16 (×4): qty 1.5

## 2015-04-16 MED ORDER — HALOPERIDOL 5 MG PO TABS
7.5000 mg | ORAL_TABLET | Freq: Two times a day (BID) | ORAL | Status: DC
  Filled 2015-04-16 (×4): qty 2

## 2015-04-16 MED ORDER — CARBAMAZEPINE 100 MG PO CHEW
300.0000 mg | CHEWABLE_TABLET | Freq: Two times a day (BID) | ORAL | Status: DC
  Filled 2015-04-16 (×46): qty 3

## 2015-04-16 NOTE — Clinical Social Work Note (Signed)
Faxed info to [facility] for referral.

## 2015-04-16 NOTE — Progress Notes (Signed)
Psychoeducational Group Note  Date:  04/16/2015 Time:  0919  Group Topic/Focus:  Recovery Goals:   The focus of this group is to identify appropriate goals for recovery and establish a plan to achieve them.  Participation Level: Did Not Attend  Participation Quality:  Not Applicable  Affect:  Not Applicable  Cognitive:  Not Applicable  Insight:  Not Applicable  Engagement in Group: Not Applicable  Additional Comments:  Pt refused to attend group this morning.  TRINITY, JOEL E 04/16/2015, 10:30 AM

## 2015-04-16 NOTE — Progress Notes (Signed)
[facility] MD Progress Note  04/16/2015  [name]  MRN:  [id number] Subjective:  Pt states: "I do not need help.'   Objective: Pt seen and chart reviewed.I have also reviewed notes in EHR from previous days . Patient is a 50 y old AAF with hx of schizophrenia , brought in [place] for aggressive , disorganized behavior at home. Pt per initial notes - was aggressive with family , paranoid and delusional about cable guy who came to her house .  Pt today continues to be very labile , disorganized , her voice is loud , seen as yelling at Clinical research associate , when Clinical research associate attempted to evaluate her. She is delusional and paranoid , with flight of ideas and loose associations. She lacks insight in to her illness and feels that the government is trying to hurt her and has networking at her home . She is uncooperative with evaluation and became agitated halfway through the assessment. Pt currently on Forced medication order - started on the week end since she has been refusing treatment.  Per Collateral  information obtained from Family-per CSW - Sister is her legal guardian. Pt per sister will benefit from [facility] hospital and needs to be transferred as soon as bed available. CSW will start referral process.   Principal Problem: Schizophrenia, paranoid Diagnosis:   Patient Active Problem List   Diagnosis Date Noted  . Noncompliance with diet and medication regimen [Z91.11, Z91.14]   . Psychoses [F29]   . Hypokalemic alkalosis [E87.3] 11/11/2011  . Medically noncompliant [Z91.19] 11/11/2011  . Schizophrenia, paranoid [F20.0] 11/07/2011   Total Time spent with patient: 25 minutes   Past Medical History:  Past Medical History  Diagnosis Date  . Schizophrenia   . Non compliance w medication regimen     Family History: Pt did not express any hx of mental illness in family.  Social History:  History  Alcohol Use  . Yes    Comment: Refuses to disclose how much     History  Drug Use No    Comment: Refuses to  answer    Social History   Social History  . Marital Status: Single    Spouse Name: [name]  . Number of Children: [name]  . Years of Education: [name]   Social History Main Topics  . Smoking status: Current Every Day Smoker  . Smokeless tobacco: None  . Alcohol Use: Yes     Comment: Refuses to disclose how much  . Drug Use: No     Comment: Refuses to answer  . Sexual Activity: Not Asked     Comment: refused to answer   Other Topics Concern  . None   Social History Narrative   Additional History:    Sleep: pt did not report any sleep issues  Appetite:  Fair    Musculoskeletal: Strength & Muscle Tone: within normal limits Gait & Station: normal Patient leans: [name]   Psychiatric Specialty Exam: Physical Exam  Review of Systems  Unable to perform ROS: mental acuity    Blood pressure 113/65, pulse 63, temperature 98.2 F (36.8 C), temperature source Oral, resp. rate 20, height  (1.6 m), weight 63.957 kg (141 lb).Body mass index is 24.98 kg/(m^2).  General Appearance: Bizarre, Disheveled  Eye Contact:: fair  Speech: Pressured  Volume: Increased  Mood: Angry, Anxious, Dysphoric and Irritable  Affect: Inappropriate and Labile  Thought Process: Circumstantial and Disorganized, tangential   Orientation: Other: unable to asses- pt is not cooperative  Thought Content: Hallucinations: Responding  to internal stimuli; very paranoid  Suicidal Thoughts: pt did not express any- states " I do not want to talk".  Homicidal Thoughts:unable to assess  Memory:unable to assess  Judgement: Impaired  Insight: Lacking  Psychomotor Activity: Increased  Concentration: Poor  Recall: Poor  Fund of Knowledge:Poor  Language: Poor  Akathisia: No  Handed:   AIMS (if indicated):    Assets:access to health care  ADL's: Impaired  Cognition: WNL           Sleep:  Number of Hours: 4.75     Current Medications: Current Facility-Administered  Medications  Medication Dose Route Frequency Provider Last Rate Last Dose  . acetaminophen (TYLENOL) tablet 650 mg  650 mg Oral Q6H PRN Charm Rings, NP      . alum & mag hydroxide-simeth (MAALOX/MYLANTA) 200-200-20 MG/5ML suspension 30 mL  30 mL Oral Q4H PRN Charm Rings, NP      . benztropine (COGENTIN) tablet 0.5 mg  0.5 mg Oral BID Jomarie Longs, MD   0.5 mg at 04/16/15 1914   Or  . benztropine mesylate (COGENTIN) injection 0.5 mg  0.5 mg Intramuscular BID Jomarie Longs, MD      . carbamazepine (TEGRETOL) tablet 300 mg  300 mg Oral BID PC Saramma Eappen, MD      . diphenhydrAMINE (BENADRYL) capsule 25 mg  25 mg Oral Q8H PRN Jomarie Longs, MD       Or  . diphenhydrAMINE (BENADRYL) injection 25 mg  25 mg Intramuscular Q8H PRN Saramma Eappen, MD      . haloperidol (HALDOL) tablet 5 mg  5 mg Oral Q8H PRN Jomarie Longs, MD       Or  . haloperidol lactate (HALDOL) injection 5 mg  5 mg Intramuscular Q8H PRN Saramma Eappen, MD      . haloperidol (HALDOL) tablet 7.5 mg  7.5 mg Oral BID Jomarie Longs, MD       Or  . haloperidol lactate (HALDOL) injection 7.5 mg  7.5 mg Intramuscular BID Jomarie Longs, MD      . hydrOXYzine (ATARAX/VISTARIL) tablet 25 mg  25 mg Oral BID PC Charm Rings, NP   25 mg at 04/16/15 7829  . LORazepam (ATIVAN) tablet 1 mg  1 mg Oral Q8H PRN Jomarie Longs, MD       Or  . LORazepam (ATIVAN) injection 1 mg  1 mg Intramuscular Q8H PRN Saramma Eappen, MD      . magnesium hydroxide (MILK OF MAGNESIA) suspension 30 mL  30 mL Oral Daily PRN Charm Rings, NP      . traZODone (DESYREL) tablet 50 mg  50 mg Oral QHS PRN Charm Rings, NP        Lab Results:  Results for orders placed or performed during the hospital encounter of 04/13/15 (from the past 48 hour(s))  TSH     Status: None   Collection Time: 04/16/15  7:00 AM  Result Value Ref Range   TSH 1.767 0.350 - 4.500 uIU/mL    Comment: Performed at [facility]  Lipid panel     Status:  Abnormal   Collection Time: 04/16/15  7:00 AM  Result Value Ref Range   Cholesterol 186 0 - 200 mg/dL   Triglycerides 64 <562 mg/dL   HDL 55 >13 mg/dL   Total CHOL/HDL Ratio 3.4 RATIO   VLDL 13 0 - 40 mg/dL   LDL Cholesterol 086 (H) 0 - 99 mg/dL    Comment:  Total Cholesterol/HDL:CHD Risk Coronary Heart Disease Risk Table                     Men   Women  1/2 Average Risk   3.4   3.3  Average Risk       5.0   4.4  2 X Average Risk   9.6   7.1  3 X Average Risk  23.4   11.0        Use the calculated Patient Ratio above and the CHD Risk Table to determine the patient's CHD Risk.        ATP III CLASSIFICATION (LDL):  <100     mg/dL   Optimal  130-865  mg/dL   Near or Above                    Optimal  130-159  mg/dL   Borderline  784-696  mg/dL   High  >295     mg/dL   Very High Performed at [facility]     Physical Findings: AIMS: Facial and Oral Movements Muscles of Facial Expression: None, normal Lips and Perioral Area: None, normal Jaw: None, normal Tongue: None, normal,Extremity Movements Upper (arms, wrists, hands, fingers): None, normal Lower (legs, knees, ankles, toes): None, normal, Trunk Movements Neck, shoulders, hips: None, normal, Overall Severity Severity of abnormal movements (highest score from questions above): None, normal Incapacitation due to abnormal movements: None, normal Patient's awareness of abnormal movements (rate only patient's report): No Awareness, Dental Status Current problems with teeth and/or dentures?: No Does patient usually wear dentures?: No  CIWA:    COWS:     Assessment: Pt is a 16 y old AAF who has a hx of schizophrenia , presented very disorganized with persecutory delusions as well as aggressive behavior. Pt continues to be aggressive, continues to get agitated when attempted to speak to her - Currently on Forced medication order- will continue treatment.    Treatment Plan Summary: Daily contact with patient  to assess and evaluate symptoms and progress in treatment and Medication management  Medications:  -Increase Haldol to 7.5 mg po/im bid for psychosis , Cogentin 0.5 mg po/IM for EPS . Pt is on Forced medication order. -Increase Tegretol to  po bid for psychosis -Continue Haldol  PO or IM q8h prn for agitation. -Continue Benadryl 25 mg PO/IM (EPS), Ativan 1 mg po/IM q8h prn for agitation . -Continue Trazodone  qhs prn insomnia -Reviewed EKG- wnl , TSH - wnl , lipid panel- wnl , Hba1c- pending , PL- pending.  Medical Decision Making:  New problem, with additional work up planned, Review of Psycho-Social Stressors (1), Review or order clinical lab tests (1), Review of Medication Regimen & Side Effects (2) and Review of New Medication or Change in Dosage (2)   Eappen,Saramma, MD 04/16/2015, 1;54 PM

## 2015-04-16 NOTE — Progress Notes (Signed)
Report given to oncoming nurse.

## 2015-04-16 NOTE — Progress Notes (Signed)
Patient ID: [name], female   DOB: Dec 02, 1960, 54 y.o.   MRN: [id number] PER STATE REGULATIONS 482.30  THIS CHART WAS REVIEWED FOR MEDICAL NECESSITY WITH RESPECT TO THE PATIENT'S ADMISSION/ DURATION OF STAY.  NEXT REVIEW DATE:   04/20/2015  Willa Rough, RN, BSN CASE MANAGER

## 2015-04-16 NOTE — Progress Notes (Signed)
D   Pt in bed and isolates to her room most of the shift   She remains delusional and guarded and reluctant to converse with staff   She reported she did not need anything  A   Verbal support offered   Medications offered    Q 15 min checks R   Pt safe at present

## 2015-04-16 NOTE — [facility] Group Notes (Signed)
[facility] LCSW Group Therapy  04/16/2015 1:15 pm  Type of Therapy: Process Group Therapy  Participation Level:  Active  Participation Quality:  Appropriate  Affect:  Flat  Cognitive:  Oriented  Insight:  Improving  Engagement in Group:  Limited  Engagement in Therapy:  Limited  Modes of Intervention:  Activity, Clarification, Education, Problem-solving and Support  Summary of Progress/Problems: Today's group addressed the issue of overcoming obstacles.  Patients were asked to identify their biggest obstacle post d/c that stands in the way of their on-going success, and then problem solve as to how to manage this. Stayed the entire time.  Appeared unengaged.  Staring straight ahead at floor much of the time.  Stated her main obstacle is her family.  "They don't understand, and get up in my business."  No further contributions.  [name] 04/16/2015   3:31 PM

## 2015-04-16 NOTE — Progress Notes (Signed)
Patient ID: [name], female   DOB: 11/09/1960, 54 y.o.   MRN: [id number] med pass refused oral medications. She had told me earlier in shift when I introduced self to her she was done taking medications. When offered her the pills angrily refused and when it was explained she would then get the medications in a shot form she said "get the shot" Took the injections of Haldol and Cogentin without further objection and didn't require additional coaxing or to have arm or hand held. Once shot done walked away from Clinical research associate. Self inventory completed and rated self 0 on feelings of hopelessness, anxiety and depression today. She is isolative and minimally verbal and irritable. No noted peer interactions.  A-Medications as ordered, force order for meds. Monitor for safety. R-No complaints at this time.

## 2015-04-17 LAB — HEMOGLOBIN A1C
Hgb A1c MFr Bld: 6.1 % — ABNORMAL HIGH (ref 4.8–5.6)
MEAN PLASMA GLUCOSE: 128 mg/dL

## 2015-04-17 LAB — PROLACTIN: PROLACTIN: 13.6 ng/mL (ref 4.8–23.3)

## 2015-04-17 MED ORDER — HALOPERIDOL 5 MG PO TABS
10.0000 mg | ORAL_TABLET | Freq: Two times a day (BID) | ORAL | Status: DC
  Filled 2015-04-17 (×4): qty 2

## 2015-04-17 MED ORDER — HALOPERIDOL LACTATE 5 MG/ML IJ SOLN
10.0000 mg | Freq: Two times a day (BID) | INTRAMUSCULAR | Status: DC
  Administered 2015-04-17 – 2015-04-18 (×2): 10 mg via INTRAMUSCULAR
  Filled 2015-04-17 (×5): qty 2

## 2015-04-17 NOTE — Progress Notes (Signed)
D   Pt in bed and isolates to her room most of the shift   She remains delusional and guarded and reluctant to converse with staff   She reported she did not need anything and did not want sleep medication   Very irritable when approached A   Verbal support offered   Medications offered    Q 15 min checks R   Pt safe at present

## 2015-04-17 NOTE — Progress Notes (Signed)
Pt remains very paranoid. During lunch she refused to go to dining hall,writer brought her a tray to her room, and offered her a meal. She refused stating' ' You can't trust everybody, you probably done something to it.' She then slammed the door .

## 2015-04-17 NOTE — [facility] Group Notes (Signed)
[facility] LCSW Aftercare Discharge Planning Group Note   04/17/2015 11:56 AM  Participation Quality:  Minimal  Mood/Affect:  Blunted  Depression Rating:    Anxiety Rating:    Thoughts of Suicide:  No Will you contract for safety?   NA  Current AVH:  Denies  Plan for Discharge/Comments:  Sat and stared.  Stated that sleep is OK.  No visitors.  Reluctant to give any more information.  Heard this AM in tx team that she refused meds and was given haldol injection this AM Transportation Means:   Supports:  [place], Milfay B

## 2015-04-17 NOTE — Progress Notes (Signed)
[facility] MD Progress Note  04/17/2015  [name]  MRN:  [id number] Subjective:  Pt states: "I am here because of the government . "    Objective: Pt seen and chart reviewed.I have also reviewed notes in EHR from previous days . Patient is a 40 y old AAF with hx of schizophrenia , brought in [facility] for aggressive , disorganized behavior at home. Pt per initial notes - was aggressive with family , paranoid and delusional about cable guy who came to her house .   Pt today continues to be very labile , disorganized , delusional , and appears agitated when writer attempted to do the evaluation. Pt walked to the corner of the room and would not participate . She lacks insight in to her illness and feels that the government is trying to hurt her and has networking at her home . Pt currently on Forced medication order - started on the week end since she has been refusing treatment.  Per staff pt continues to be noncompliant with medications off and on. Pt continues to be disorganized and delusional.     Principal Problem: Schizophrenia, paranoid Diagnosis:   Patient Active Problem List   Diagnosis Date Noted  . Noncompliance with diet and medication regimen [Z91.11, Z91.14]   . Psychoses [F29]   . Hypokalemic alkalosis [E87.3] 11/11/2011  . Medically noncompliant [Z91.19] 11/11/2011  . Schizophrenia, paranoid [F20.0] 11/07/2011   Total Time spent with patient: 25 minutes   Past Medical History:  Past Medical History  Diagnosis Date  . Schizophrenia   . Non compliance w medication regimen     Family History: Pt did not express any hx of mental illness in family.  Social History:  History  Alcohol Use  . Yes    Comment: Refuses to disclose how much     History  Drug Use No    Comment: Refuses to answer    Social History   Social History  . Marital Status: Single    Spouse Name: [name]  . Number of Children: [name]  . Years of Education: [name]   Social History Main Topics  .  Smoking status: Current Every Day Smoker  . Smokeless tobacco: None  . Alcohol Use: Yes     Comment: Refuses to disclose how much  . Drug Use: No     Comment: Refuses to answer  . Sexual Activity: Not Asked     Comment: refused to answer   Other Topics Concern  . None   Social History Narrative   Additional History:    Sleep: pt did not report any sleep issues  Appetite:  Fair    Musculoskeletal: Strength & Muscle Tone: within normal limits Gait & Station: normal Patient leans: [name]   Psychiatric Specialty Exam: Physical Exam  Review of Systems  Unable to perform ROS: mental acuity    Blood pressure 99/62, pulse 56, temperature 98.1 F (36.7 C), temperature source Oral, resp. rate 18, height 5\' 3"  (1.6 m), weight 63.957 kg (141 lb).Body mass index is 24.98 kg/(m^2).  General Appearance: Bizarre, Disheveled  Eye Contact:: fair  Speech: Pressured  Volume: Increased  Mood: Angry, Anxious, Dysphoric and Irritable  Affect: Inappropriate and Labile  Thought Process: Disorganized, tangential   Orientation: Other: unable to asses- pt is not cooperative- to person  Thought Content:seen responding to internal stimuli, is paranoid , delusional  Suicidal Thoughts: pt did not express any-is uncooperative with part of evaluation  Homicidal Thoughts:unable to assess  Memory:unable to  assess  Judgement: Impaired  Insight: Lacking  Psychomotor Activity: Increased  Concentration: Poor  Recall: Poor  Fund of Knowledge:Poor  Language: Poor  Akathisia: No  Handed:   AIMS (if indicated):    Assets:access to health care  ADL's: Impaired  Cognition: WNL           Sleep:  Number of Hours: 6.75     Current Medications: Current Facility-Administered Medications  Medication Dose Route Frequency Forbes Loll Last Rate Last Dose  . acetaminophen (TYLENOL) tablet 650 mg  650 mg Oral Q6H PRN Charm Rings, NP      . alum & mag  hydroxide-simeth (MAALOX/MYLANTA) 200-200-20 MG/5ML suspension 30 mL  30 mL Oral Q4H PRN Charm Rings, NP      . benztropine (COGENTIN) tablet 0.5 mg  0.5 mg Oral BID Jomarie Longs, MD   0.5 mg at 04/16/15 9528   Or  . benztropine mesylate (COGENTIN) injection 0.5 mg  0.5 mg Intramuscular BID Jomarie Longs, MD   0.5 mg at 04/17/15 0759  . carbamazepine (TEGRETOL) chewable tablet 300 mg  300 mg Oral BID PC Saramma Eappen, MD   300 mg at 04/16/15 1714  . diphenhydrAMINE (BENADRYL) capsule 25 mg  25 mg Oral Q8H PRN Jomarie Longs, MD       Or  . diphenhydrAMINE (BENADRYL) injection 25 mg  25 mg Intramuscular Q8H PRN Saramma Eappen, MD      . haloperidol (HALDOL) tablet 10 mg  10 mg Oral BID Jomarie Longs, MD       Or  . haloperidol lactate (HALDOL) injection 10 mg  10 mg Intramuscular BID Saramma Eappen, MD      . haloperidol (HALDOL) tablet 5 mg  5 mg Oral Q8H PRN Jomarie Longs, MD       Or  . haloperidol lactate (HALDOL) injection 5 mg  5 mg Intramuscular Q8H PRN Saramma Eappen, MD      . hydrOXYzine (ATARAX/VISTARIL) tablet 25 mg  25 mg Oral BID PC Charm Rings, NP   25 mg at 04/16/15 4132  . LORazepam (ATIVAN) tablet 1 mg  1 mg Oral Q8H PRN Jomarie Longs, MD       Or  . LORazepam (ATIVAN) injection 1 mg  1 mg Intramuscular Q8H PRN Saramma Eappen, MD      . magnesium hydroxide (MILK OF MAGNESIA) suspension 30 mL  30 mL Oral Daily PRN Charm Rings, NP      . traZODone (DESYREL) tablet 50 mg  50 mg Oral QHS PRN Charm Rings, NP        Lab Results:  Results for orders placed or performed during the hospital encounter of 04/13/15 (from the past 48 hour(s))  TSH     Status: None   Collection Time: 04/16/15  7:00 AM  Result Value Ref Range   TSH 1.767 0.350 - 4.500 uIU/mL    Comment: Performed at [facility]  Lipid panel     Status: Abnormal   Collection Time: 04/16/15  7:00 AM  Result Value Ref Range   Cholesterol 186 0 - 200 mg/dL   Triglycerides 64 <440  mg/dL   HDL 55 >10 mg/dL   Total CHOL/HDL Ratio 3.4 RATIO   VLDL 13 0 - 40 mg/dL   LDL Cholesterol 272 (H) 0 - 99 mg/dL    Comment:        Total Cholesterol/HDL:CHD Risk Coronary Heart Disease Risk Table  Men   Women  1/2 Average Risk   3.4   3.3  Average Risk       5.0   4.4  2 X Average Risk   9.6   7.1  3 X Average Risk  23.4   11.0        Use the calculated Patient Ratio above and the CHD Risk Table to determine the patient's CHD Risk.        ATP III CLASSIFICATION (LDL):  <100     mg/dL   Optimal  130-865  mg/dL   Near or Above                    Optimal  130-159  mg/dL   Borderline  784-696  mg/dL   High  >295     mg/dL   Very High Performed at [facility]   Hemoglobin A1c     Status: Abnormal   Collection Time: 04/16/15  7:00 AM  Result Value Ref Range   Hgb A1c MFr Bld 6.1 (H) 4.8 - 5.6 %    Comment: (NOTE)         Pre-diabetes: 5.7 - 6.4         Diabetes: >6.4         Glycemic control for adults with diabetes: <7.0    Mean Plasma Glucose 128 mg/dL    Comment: (NOTE) Performed At: [facility] [address] [place], [place] [id number] Mila Homer MD [id number] Performed at [facility]   Prolactin     Status: None   Collection Time: 04/16/15  7:00 AM  Result Value Ref Range   Prolactin 13.6 4.8 - 23.3 ng/mL    Comment: (NOTE) Performed At: [facility] [address] [place], [place] [id number] Mila Homer MD [id number] Performed at [facility]     Physical Findings: AIMS: Facial and Oral Movements Muscles of Facial Expression: None, normal Lips and Perioral Area: None, normal Jaw: None, normal Tongue: None, normal,Extremity Movements Upper (arms, wrists, hands, fingers): None, normal Lower (legs, knees, ankles, toes): None, normal, Trunk Movements Neck, shoulders, hips: None, normal, Overall Severity Severity of abnormal movements (highest score  from questions above): None, normal Incapacitation due to abnormal movements: None, normal Patient's awareness of abnormal movements (rate only patient's report): No Awareness, Dental Status Current problems with teeth and/or dentures?: No Does patient usually wear dentures?: No  CIWA:    COWS:      Assessment: Pt is a 14 y old AAF who has a hx of schizophrenia , presented very disorganized with persecutory delusions as well as aggressive behavior. Pt continues to be uncooperative , delusional and paranoid. Currently on Forced medication order- will continue treatment. Per Collateral  information obtained from Family-per CSW - Sister is her legal guardian. Pt per sister will benefit from [facility] hospital and needs to be transferred as soon as bed available. CSW to refer patient for transfer.     Treatment Plan Summary: Daily contact with patient to assess and evaluate symptoms and progress in treatment and Medication management  Medications:  -Increase Haldol to 10 mg po/im bid for psychosis , Cogentin 0.5 mg po/IM for EPS . Pt is on Forced medication order. -Increased Tegretol to  po bid for psychosis -Continue Haldol  PO or IM q8h prn for agitation. -Continue Benadryl 25 mg PO/IM (EPS), Ativan 1 mg po/IM q8h prn for agitation . -Continue Trazodone  qhs prn insomnia -Reviewed  EKG- wnl , TSH - wnl , lipid panel- wnl , Hba1c- 6.1 - dietician consult when pt is more stable  , PL- wnl.  Medical Decision Making:  New problem, with additional work up planned, Review of Psycho-Social Stressors (1), Review or order clinical lab tests (1), Review of Medication Regimen & Side Effects (2) and Review of New Medication or Change in Dosage (2)   Eappen,Saramma, MD 04/17/2015, 12:47PM

## 2015-04-17 NOTE — [facility] Group Notes (Signed)
[facility] LCSW Group Therapy  04/17/2015 3:13 PM  Type of Therapy: Group Therapy  Participation Level:Invited. Chose not to attend.   Summary of Progress/Problems: Onalee Hua from [employer] was here to tell his story of recovery and play his guitar.  Vito Backers. Beverely Pace 04/17/2015 3:13 PM

## 2015-04-17 NOTE — Progress Notes (Signed)
Pt presents with irritable mood, affect labile. [name] is very paranoid on the unit, when Clinical research associate approached this am, she was pacing in her room. She became very guarded during assessment, she states '' I'm fine, I'm sleeping . I don't need to be here. '' when writer asked pt to come take her medications she started yelling stating '' I knew you were going to start in on that I don't need no damn medicine there is nothing wrong with me. '' Patient denies any SI but would not answer further questions, she refused to complete her self inventory. She has a forced med order,and required her am medications by injections as she refused to take orally, but didn't require a manual hold. Pt is safe, will continue to monitor q 15 minutes for safety.

## 2015-04-17 NOTE — Progress Notes (Signed)
Adult Psychoeducational Group Note  Date:  04/17/2015 Time: 8:25 PM  Group Topic/Focus:  Wrap-Up Group:   The focus of this group is to help patients review their daily goal of treatment and discuss progress on daily workbooks.  Participation Level:  Did Not Attend  Participation Quality:  Not Applicable  Affect:  Not Applicable  Cognitive:  Not Applicable  Insight: None  Engagement in Group:  Not Applicable  Modes of Intervention:  Not Applicable  Additional Comments:  Pt did not attend wrap-up group because pt was asleep.   Cleotilde Neer 04/17/2015, 9:54 PM

## 2015-04-18 MED ORDER — HALOPERIDOL 5 MG PO TABS
15.0000 mg | ORAL_TABLET | Freq: Two times a day (BID) | ORAL | Status: DC
  Filled 2015-04-18 (×4): qty 3

## 2015-04-18 MED ORDER — HALOPERIDOL LACTATE 5 MG/ML IJ SOLN
10.0000 mg | Freq: Two times a day (BID) | INTRAMUSCULAR | Status: DC
  Filled 2015-04-18 (×2): qty 2

## 2015-04-18 MED ORDER — HALOPERIDOL LACTATE 5 MG/ML IJ SOLN
15.0000 mg | Freq: Two times a day (BID) | INTRAMUSCULAR | Status: DC
  Administered 2015-04-18 – 2015-04-19 (×2): 15 mg via INTRAMUSCULAR
  Filled 2015-04-18 (×5): qty 3

## 2015-04-18 MED ORDER — HALOPERIDOL 5 MG PO TABS
15.0000 mg | ORAL_TABLET | Freq: Two times a day (BID) | ORAL | Status: DC
  Filled 2015-04-18 (×2): qty 3

## 2015-04-18 NOTE — Progress Notes (Signed)
Patient ID: [name], female   DOB: 23-Nov-1960, 54 y.o.   MRN: [id number]  Adult Psychoeducational Group Note  Date:  04/18/2015 Time: 09:35am  Group Topic/Focus:  Building Self Esteem:   The Focus of this group is helping patients become aware of the effects of self-esteem on their lives, the things they and others do that enhance or undermine their self-esteem, seeing the relationship between their level of self-esteem and the choices they make and learning ways to enhance self-esteem.  Participation Level:  Minimal  Participation Quality:  Attentive  Affect:  Flat  Cognitive:  Alert and Disorganized  Insight: Limited  Engagement in Group:  Limited  Modes of Intervention:  Activity, Education and Orientation  Additional Comments:  Pt attended group but declined to participate in introductions or in the activity.   [name] 04/18/2015, 11:54 AM

## 2015-04-18 NOTE — Progress Notes (Signed)
[facility] MD Progress Note  04/18/2015  [name]  MRN:  [id number] Subjective:  Pt states: "I am here because of the government .I will leave when the government releases me.'    Objective: Pt seen and chart reviewed.I have also reviewed notes in EHR from previous days . Patient is a 20 y old AAF with hx of schizophrenia , brought in [facility] for aggressive , disorganized behavior at home. Pt per initial notes - was aggressive with family , paranoid and delusional about cable guy who came to her house .   Pt today continues to be very labile , disorganized , delusional , and uncooperative . This has been patient's presentation since admission. Pt lacks insight in to her illness , delusional that she is here because government placed her here, is delusional about network placed in her house by the government. Pt currently on Forced medication order - started on the week end since she has been refusing treatment.  Per staff pt continues to be noncompliant with medications off and on. Pt continues to need a lot of support.Tolerating medications well. Denies side effects.     Principal Problem: Schizophrenia, paranoid Diagnosis:   Patient Active Problem List   Diagnosis Date Noted  . Noncompliance with diet and medication regimen [Z91.11, Z91.14]   . Psychoses [F29]   . Hypokalemic alkalosis [E87.3] 11/11/2011  . Medically noncompliant [Z91.19] 11/11/2011  . Schizophrenia, paranoid [F20.0] 11/07/2011   Total Time spent with patient: 25 minutes   Past Medical History:  Past Medical History  Diagnosis Date  . Schizophrenia   . Non compliance w medication regimen     Family History: Pt did not express any hx of mental illness in family.  Social History:  History  Alcohol Use  . Yes    Comment: Refuses to disclose how much     History  Drug Use No    Comment: Refuses to answer    Social History   Social History  . Marital Status: Single    Spouse Name: [name]  . Number of  Children: [name]  . Years of Education: [name]   Social History Main Topics  . Smoking status: Current Every Day Smoker  . Smokeless tobacco: None  . Alcohol Use: Yes     Comment: Refuses to disclose how much  . Drug Use: No     Comment: Refuses to answer  . Sexual Activity: Not Asked     Comment: refused to answer   Other Topics Concern  . None   Social History Narrative   Additional History:    Sleep: pt did not report any sleep issues  Appetite:  Fair    Musculoskeletal: Strength & Muscle Tone: within normal limits Gait & Station: normal Patient leans: [name]   Psychiatric Specialty Exam: Physical Exam  Review of Systems  Unable to perform ROS: mental acuity    Blood pressure 95/64, pulse 57, temperature 98 F (36.7 C), temperature source Oral, resp. rate 16, height  (1.6 m), weight 63.957 kg (141 lb).Body mass index is 24.98 kg/(m^2).  General Appearance: Bizarre, Disheveled  Eye Contact:: fair  Speech: Pressured  Volume: Increased  Mood: Angry, Anxious, Dysphoric and Irritable  Affect: Inappropriate and Labile  Thought Process: Disorganized, tangential   Orientation: Other: unable to asses- pt is not cooperative- to person  Thought Content:seen responding to internal stimuli, is paranoid , delusional  Suicidal Thoughts: pt did not express any-is uncooperative with part of evaluation  Homicidal Thoughts:unable to  assess  Memory:unable to assess- seems to be intact grossly - immediate - fair , recent - fair  Judgement: Impaired  Insight: Lacking  Psychomotor Activity: Increased  Concentration: Poor  Recall: Poor  Fund of Knowledge:Poor  Language: Poor  Akathisia: No  Handed:   AIMS (if indicated):    Assets:access to health care  ADL's: Impaired  Cognition: WNL           Sleep:  Number of Hours: 6     Current Medications: Current Facility-Administered Medications  Medication Dose Route Frequency Dericka Ostenson  Last Rate Last Dose  . acetaminophen (TYLENOL) tablet 650 mg  650 mg Oral Q6H PRN Charm Rings, NP      . alum & mag hydroxide-simeth (MAALOX/MYLANTA) 200-200-20 MG/5ML suspension 30 mL  30 mL Oral Q4H PRN Charm Rings, NP      . benztropine (COGENTIN) tablet 0.5 mg  0.5 mg Oral BID Jomarie Longs, MD   0.5 mg at 04/16/15 4696   Or  . benztropine mesylate (COGENTIN) injection 0.5 mg  0.5 mg Intramuscular BID Jomarie Longs, MD   0.5 mg at 04/18/15 0925  . carbamazepine (TEGRETOL) chewable tablet 300 mg  300 mg Oral BID PC Saramma Eappen, MD   300 mg at 04/16/15 1714  . diphenhydrAMINE (BENADRYL) capsule 25 mg  25 mg Oral Q8H PRN Jomarie Longs, MD       Or  . diphenhydrAMINE (BENADRYL) injection 25 mg  25 mg Intramuscular Q8H PRN Saramma Eappen, MD      . haloperidol lactate (HALDOL) injection 15 mg  15 mg Intramuscular BID Jomarie Longs, MD       Or  . haloperidol (HALDOL) tablet 15 mg  15 mg Oral BID Saramma Eappen, MD      . haloperidol (HALDOL) tablet 5 mg  5 mg Oral Q8H PRN Jomarie Longs, MD       Or  . haloperidol lactate (HALDOL) injection 5 mg  5 mg Intramuscular Q8H PRN Saramma Eappen, MD      . hydrOXYzine (ATARAX/VISTARIL) tablet 25 mg  25 mg Oral BID PC Charm Rings, NP   25 mg at 04/16/15 2952  . LORazepam (ATIVAN) tablet 1 mg  1 mg Oral Q8H PRN Jomarie Longs, MD       Or  . LORazepam (ATIVAN) injection 1 mg  1 mg Intramuscular Q8H PRN Saramma Eappen, MD      . magnesium hydroxide (MILK OF MAGNESIA) suspension 30 mL  30 mL Oral Daily PRN Charm Rings, NP      . traZODone (DESYREL) tablet 50 mg  50 mg Oral QHS PRN Charm Rings, NP        Lab Results:  No results found for this or any previous visit (from the past 48 hour(s)).  Physical Findings: AIMS: Facial and Oral Movements Muscles of Facial Expression: None, normal Lips and Perioral Area: None, normal Jaw: None, normal Tongue: None, normal,Extremity Movements Upper (arms, wrists, hands, fingers): None,  normal Lower (legs, knees, ankles, toes): None, normal, Trunk Movements Neck, shoulders, hips: None, normal, Overall Severity Severity of abnormal movements (highest score from questions above): None, normal Incapacitation due to abnormal movements: None, normal Patient's awareness of abnormal movements (rate only patient's report): No Awareness, Dental Status Current problems with teeth and/or dentures?: No Does patient usually wear dentures?: No  CIWA:    COWS:      Assessment: Pt is a 15 y old AAF who has a hx of schizophrenia ,  presented very disorganized with persecutory delusions as well as aggressive behavior. Pt continues to be uncooperative , delusional and paranoid. Currently on Forced medication order- will continue treatment. Per Collateral  information obtained from Family-per CSW - Sister is her legal guardian. Pt per sister will benefit from [facility] hospital and needs to be transferred as soon as bed available. CSW to refer patient for transfer.     Treatment Plan Summary: Daily contact with patient to assess and evaluate symptoms and progress in treatment and Medication management  Medications:  -Increase Haldol to 15 mg po/im bid for psychosis , Cogentin 0.5 mg po/IM for EPS . Pt is on Forced medication order. -Increased Tegretol to  po bid for psychosis -Continue Haldol  PO or IM q8h prn for agitation. -Continue Benadryl 25 mg PO/IM (EPS), Ativan 1 mg po/IM q8h prn for agitation . -Continue Trazodone  qhs prn insomnia -Reviewed EKG- wnl , TSH - wnl , lipid panel- wnl , Hba1c- 6.1 - dietician consult when pt is more stable  , PL- wnl. CSW will work on [place] referral for placement.  Medical Decision Making:  Review of Psycho-Social Stressors (1), Review or order clinical lab tests (1), Review of Medication Regimen & Side Effects (2) and Review of New Medication or Change in Dosage (2)   Eappen,Saramma, MD 04/18/2015, 10:48 AM

## 2015-04-18 NOTE — Progress Notes (Signed)
Patient ID: [name], female   DOB: 01-Aug-1960, 54 y.o.   MRN: [id number]   Pt currently presents with a flat affect and isolative behavior. Pt remains in her room for most of the day. Pt seen looking around, responding to internal stimuli today. Pt cooperative during IM administration but refuses to take or talk about taking PO meds. Pt becomes visibly irritated when the subject is brought up. Pt has poor body hygiene.  Pt provided with medications per providers orders. Pt's labs and vitals were monitored throughout the day. Pt supported emotionally and encouraged to express concerns and questions. Pt educated on medications. Pt encouraged to shower today and did, pts clothes washed.  Pt's safety ensured with 15 minute and environmental checks. Pt currently denies SI/HI and A/V hallucinations. Pt verbally agrees to seek staff if SI/HI or A/VH occurs and to consult with staff before acting on these thoughts. Will continue POC.

## 2015-04-18 NOTE — [facility] Group Notes (Signed)
[facility] Group Notes:  (Counselor/Nursing/MHT/Case Management/Adjunct)  04/18/2015 1:15PM  Type of Therapy:  Group Therapy  Participation Level:  Active  Participation Quality:  Appropriate  Affect:  Flat  Cognitive:  Oriented  Insight:  Improving  Engagement in Group:  Limited  Engagement in Therapy:  Limited  Modes of Intervention:  Discussion, Exploration and Socialization  Summary of Progress/Problems: The topic for group was balance in life.  Pt participated in the discussion about when their life was in balance and out of balance and how this feels.  Pt discussed ways to get back in balance and short term goals they can work on to get where they want to be. Stayed the entire time.  When asked to participate, was impossible to follow.  Tangential, loose associations with paranoid overtones.   [name] Rogue 04/18/2015 5:53 PM
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
## 2015-04-18 NOTE — Progress Notes (Signed)
Psychoeducational Group Note  Date:  04/18/2015 Time:  2157  Group Topic/Focus:  Wrap-Up Group:   The focus of this group is to help patients review their daily goal of treatment and discuss progress on daily workbooks.  Participation Level: Did Not Attend  Participation Quality:  Not Applicable  Affect:  Not Applicable  Cognitive:  Not Applicable  Insight:  Not Applicable  Engagement in Group: Not Applicable  Additional Comments:  The patient did not attend group this evening.   Hazle Coca S 04/18/2015, 9:57 PM

## 2015-04-18 NOTE — Progress Notes (Signed)
D   Pt in bed and isolates to her room most of the shift   She remains delusional and guarded and reluctant to converse with staff   She reported she did not need anything and did not want sleep medication   Very irritable when approached A   Verbal support offered   Medications offered    Q 15 min checks R   Pt safe at present  

## 2015-04-19 DIAGNOSIS — Z9114 Patient's other noncompliance with medication regimen: Secondary | ICD-10-CM

## 2015-04-19 MED ORDER — HALOPERIDOL 5 MG PO TABS
7.5000 mg | ORAL_TABLET | Freq: Two times a day (BID) | ORAL | Status: DC
  Filled 2015-04-19 (×10): qty 2

## 2015-04-19 MED ORDER — HALOPERIDOL LACTATE 5 MG/ML IJ SOLN
7.5000 mg | Freq: Two times a day (BID) | INTRAMUSCULAR | Status: DC
  Administered 2015-04-19 – 2015-04-22 (×6): 7.5 mg via INTRAMUSCULAR
  Filled 2015-04-19 (×7): qty 1.5
  Filled 2015-04-19: qty 2
  Filled 2015-04-19 (×3): qty 1.5

## 2015-04-19 MED ORDER — OLANZAPINE 10 MG IM SOLR
5.0000 mg | Freq: Every day | INTRAMUSCULAR | Status: DC
  Administered 2015-04-19 – 2015-04-21 (×3): 5 mg via INTRAMUSCULAR
  Filled 2015-04-19 (×5): qty 10

## 2015-04-19 MED ORDER — OLANZAPINE 5 MG PO TABS
5.0000 mg | ORAL_TABLET | Freq: Every day | ORAL | Status: DC
  Filled 2015-04-19 (×3): qty 1
  Filled 2015-04-19: qty 2
  Filled 2015-04-19 (×2): qty 1

## 2015-04-19 NOTE — Tx Team (Signed)
Interdisciplinary Treatment Plan Update (Adult)  Date:  04/19/2015   Time Reviewed:  12:27 PM   Progress in Treatment: Attending groups: Yes Participating in groups:  Yes Taking medication as prescribed:  Yes. Tolerating medication:  Yes. Family/Significant other contact made:  Yes Patient understands diagnosis:  No  Limited insight Discussing patient identified problems/goals with staff:  Yes, see initial care plan. Medical problems stabilized or resolved:  Yes. Denies suicidal/homicidal ideation: Yes. Issues/concerns per patient self-inventory:  No. Other:  New problem(s) identified:  Discharge Plan or Barriers:  Transfer to [facility]  Reason for Continuation of Hospitalization: Delusions  Medication stabilization Other; describe Paranoia  Comments:   [name] is a 54 y.o. female history of schizophrenia here presenting with agitation and aggressive behavior. The cable company was at her house earlier and she became aggressive towards her as well as towards her mother. She was called and she was IVC. On exam, patient refused to answer questions and feels that everyone is here to kill her and are evil. I was unable to examine her because she refused. She was given Ativan and Benadryl for agitation.  Second opinion delivered to force meds as pt was refusing.  She is now taking Haldol, Tegretol PO Estimated length of stay: 4-5 days  9/30:  Patient referred for transfer to [facility], IVC paperwork renewed.  Continues to have limited insight, experiencing AVH.    New goal(s):  Review of initial/current patient goals per problem list:   Review of initial/current patient goals per problem list:  1. Goal(s): Patient will participate in aftercare plan   Met: No   Target date: 3-5 days post admission date   As evidenced by: Patient will participate within aftercare plan AEB aftercare provider and housing plan at discharge being identified.  04/19/2015: Working on  referral to [facility] per request of guardian        5. Goal(s): Patient will demonstrate decreased signs of psychosis  * Met: No  * Target date: 3-5 days post admission date  * As evidenced by: Patient will demonstrate decreased frequency of AVH or return to baseline function 04/15/15   Presents with paranoia, disorganization, mood lability due to non compliance with meds, oupt tx 04/19/15:  Continues to appear to respond to internal stimuli, AH, irritable, labile mood        Attendees: Patient:  04/19/2015 12:27 PM   Family:   04/19/2015 12:27 PM   Physician:  Ursula Alert, MD 04/19/2015 12:27 PM   Nursing:   Gaylan Gerold, RN 04/19/2015 12:27 PM   CSW:    Edwyna Shell, LCSW   04/19/2015 12:27 PM   Other: Benjamine Mola, RN 04/19/2015 12:27 PM   Other:   04/19/2015 12:27 PM   Other:  Lars Pinks, Nurse CM 04/19/2015 12:27 PM   Other:  Lucinda Dell, Beverly Sessions TCT 04/19/2015 12:27 PM   Other:  Norberto Sorenson, [place]  04/19/2015 12:27 PM   Other:  04/19/2015 12:27 PM   Other:  04/19/2015 12:27 PM   Other:  04/19/2015 12:27 PM   Other:  04/19/2015 12:27 PM   Other:  04/19/2015 12:27 PM   Other:   04/19/2015 12:27 PM    Scribe for Treatment Team:   Edwyna Shell, LCSW Clinical Social Worker , 04/19/2015 12:27 PM

## 2015-04-19 NOTE — Progress Notes (Signed)
[facility] MD Progress Note  04/19/2015  Camay Pedigo  MRN:  [id number] Subjective:  Pt states: "I am not going to do this every day , You are asking me these questions every day to piss me off.'     Objective: Pt seen and chart reviewed.I have also reviewed notes in EHR from previous days . Patient is a 33 y old AAF with hx of schizophrenia , brought in [facility] for aggressive , disorganized behavior at home. Pt per initial notes - was aggressive with family , paranoid and delusional about cable guy who came to her house .   Pt today continues to be very  disorganized , delusional , and uncooperative . This has been patient's presentation since admission and she has not been able to sit through even a single assessment . Pt continues to lack insight in to her illness . She continues to refuse medications requiring forced medication use . She continues to be focussed on Government as well as delusions of networks placed in her house by the government.  Per staff pt continues to be noncompliant with medications off and on. Pt continues to need a lot of support.Tolerating medications well. Denies side effects.     Principal Problem: Schizophrenia, paranoid Diagnosis:   Patient Active Problem List   Diagnosis Date Noted  . Noncompliance with diet and medication regimen [Z91.11, Z91.14]   . Psychoses [F29]   . Hypokalemic alkalosis [E87.3] 11/11/2011  . Medically noncompliant [Z91.19] 11/11/2011  . Schizophrenia, paranoid [F20.0] 11/07/2011   Total Time spent with patient: 25 minutes   Past Medical History:  Past Medical History  Diagnosis Date  . Schizophrenia   . Non compliance w medication regimen     Family History: Pt did not express any hx of mental illness in family.  Social History:  History  Alcohol Use  . Yes    Comment: Refuses to disclose how much     History  Drug Use No    Comment: Refuses to answer    Social History   Social History  . Marital Status: Single   Spouse Name: [name]  . Number of Children: [name]  . Years of Education: [name]   Social History Main Topics  . Smoking status: Current Every Day Smoker  . Smokeless tobacco: None  . Alcohol Use: Yes     Comment: Refuses to disclose how much  . Drug Use: No     Comment: Refuses to answer  . Sexual Activity: Not Asked     Comment: refused to answer   Other Topics Concern  . None   Social History Narrative   Additional History:    Sleep: pt did not report any sleep issues  Appetite:  Fair    Musculoskeletal: Strength & Muscle Tone: within normal limits Gait & Station: normal Patient leans: [name]   Psychiatric Specialty Exam: Physical Exam  Review of Systems  Unable to perform ROS: mental acuity    Blood pressure 90/56, pulse 59, temperature 98.3 F (36.8 C), temperature source Oral, resp. rate 16, height  (1.6 m), weight 63.957 kg (141 lb).Body mass index is 24.98 kg/(m^2).  General Appearance: Bizarre, Disheveled  Eye Contact:: fair  Speech: Pressured  Volume: Increased  Mood: Angry, Anxious, Dysphoric and Irritable  Affect: Inappropriate and Labile  Thought Process: Disorganized, tangential   Orientation:is Ox3 to person , place , time  Thought Content:seen responding to internal stimuli, is paranoid , delusional  Suicidal Thoughts: pt did not express any-is  uncooperative with part of evaluation  Homicidal Thoughts:unable to assess  Memory:unable to assess- seems to be intact grossly - immediate - fair , recent - fair  Judgement: Impaired  Insight: Lacking  Psychomotor Activity: Increased  Concentration: Poor  Recall: Poor  Fund of Knowledge:Poor  Language: Poor  Akathisia: No  Handed:   AIMS (if indicated):    Assets:access to health care  ADL's: Impaired  Cognition: WNL           Sleep:  Number of Hours: 6.75     Current Medications: Current Facility-Administered Medications  Medication Dose Route Frequency  Provider Last Rate Last Dose  . acetaminophen (TYLENOL) tablet 650 mg  650 mg Oral Q6H PRN Charm Rings, NP      . alum & mag hydroxide-simeth (MAALOX/MYLANTA) 200-200-20 MG/5ML suspension 30 mL  30 mL Oral Q4H PRN Charm Rings, NP      . benztropine (COGENTIN) tablet 0.5 mg  0.5 mg Oral BID Jomarie Longs, MD   0.5 mg at 04/16/15 1610   Or  . benztropine mesylate (COGENTIN) injection 0.5 mg  0.5 mg Intramuscular BID Jomarie Longs, MD   0.5 mg at 04/19/15 0846  . carbamazepine (TEGRETOL) chewable tablet 300 mg  300 mg Oral BID PC Saramma Eappen, MD   300 mg at 04/16/15 1714  . diphenhydrAMINE (BENADRYL) capsule 25 mg  25 mg Oral Q8H PRN Jomarie Longs, MD       Or  . diphenhydrAMINE (BENADRYL) injection 25 mg  25 mg Intramuscular Q8H PRN Saramma Eappen, MD      . haloperidol (HALDOL) tablet 5 mg  5 mg Oral Q8H PRN Jomarie Longs, MD       Or  . haloperidol lactate (HALDOL) injection 5 mg  5 mg Intramuscular Q8H PRN Saramma Eappen, MD      . haloperidol (HALDOL) tablet 7.5 mg  7.5 mg Oral BID Jomarie Longs, MD       Or  . haloperidol lactate (HALDOL) injection 7.5 mg  7.5 mg Intramuscular BID Jomarie Longs, MD      . hydrOXYzine (ATARAX/VISTARIL) tablet 25 mg  25 mg Oral BID PC Charm Rings, NP   25 mg at 04/16/15 9604  . LORazepam (ATIVAN) tablet 1 mg  1 mg Oral Q8H PRN Jomarie Longs, MD       Or  . LORazepam (ATIVAN) injection 1 mg  1 mg Intramuscular Q8H PRN Saramma Eappen, MD      . magnesium hydroxide (MILK OF MAGNESIA) suspension 30 mL  30 mL Oral Daily PRN Charm Rings, NP      . OLANZapine (ZYPREXA) tablet 5 mg  5 mg Oral QHS Jomarie Longs, MD       Or  . OLANZapine (ZYPREXA) injection 5 mg  5 mg Intramuscular QHS Saramma Eappen, MD      . traZODone (DESYREL) tablet 50 mg  50 mg Oral QHS PRN Charm Rings, NP        Lab Results:  No results found for this or any previous visit (from the past 48 hour(s)).  Physical Findings: AIMS: Facial and Oral Movements Muscles  of Facial Expression: None, normal Lips and Perioral Area: None, normal Jaw: None, normal Tongue: None, normal,Extremity Movements Upper (arms, wrists, hands, fingers): None, normal Lower (legs, knees, ankles, toes): None, normal, Trunk Movements Neck, shoulders, hips: None, normal, Overall Severity Severity of abnormal movements (highest score from questions above): None, normal Incapacitation due to abnormal movements: None, normal Patient's  awareness of abnormal movements (rate only patient's report): No Awareness, Dental Status Current problems with teeth and/or dentures?: No Does patient usually wear dentures?: No  CIWA:    COWS:      Assessment: Pt is a 30 y old AAF who has a hx of schizophrenia , presented very disorganized with persecutory delusions as well as aggressive behavior. Pt continues to be uncooperative , delusional and paranoid. Currently on Forced medication order- will continue treatment. Per Collateral  information obtained from Family-per CSW - Sister is her legal guardian. Pt per sister will benefit from [facility] hospital and needs to be transferred as soon as bed available. CSW to refer patient for transfer.     Treatment Plan Summary: Daily contact with patient to assess and evaluate symptoms and progress in treatment and Medication management  Medications:  -Reduce Haldol to 7.5  mg po/im bid for psychosis , Cogentin 0.5 mg po/IM for EPS . Pt is on Forced medication order. -Will add Zyprexa 5 mg po /im at bedtime to augment the effect of Haldol.  -Increased Tegretol to  po bid for psychosis -Continue Haldol  PO or IM q8h prn for agitation. -Continue Benadryl 25 mg PO/IM (EPS), Ativan 1 mg po/IM q8h prn for agitation . -Continue Trazodone  qhs prn insomnia -Reviewed EKG- wnl , TSH - wnl , lipid panel- wnl , Hba1c- 6.1 - dietician consult when pt is more stable  , PL- wnl. CSW will work on [place] referral for placement.  Medical Decision Making:  Review  of Psycho-Social Stressors (1), Review or order clinical lab tests (1), Review of Medication Regimen & Side Effects (2) and Review of New Medication or Change in Dosage (2)   Eappen,Saramma, MD 04/19/2015,12:16 PM
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
## 2015-04-19 NOTE — BHH Group Notes (Signed)
BHH LCSW Group Therapy   04/19/2015 1:49 PM  Type of Therapy: Group Therapy  Participation Level:  Active  Participation Quality:  Attentive  Affect:  Flat  Cognitive:  Oriented  Insight:  Limited  Engagement in Therapy:  Engaged  Modes of Intervention:  Discussion and Socialization  Summary of Progress/Problems: Chaplain was here to lead a group on themes of hope and/or courage and love.  Pt was alert and pleasant throughout group. Pt spoke about things she learned from her grandmother growing up that shaped how she felt about love. "You have to love yourself first". Pt also spoke about her family teaching her to be independent and strong. Pt's thoughts were a bit unorganized and difficult to follow at times.  Jonathon Jordan 04/19/2015 1:49 PM

## 2015-04-19 NOTE — Progress Notes (Signed)
D: Patient in room sleeping on approach. Pt awoken for medication was irritable and augmentative refusing PO medication. Pt stated " it don't work". Pt denies SI/HI/AVH and pain. No physical distressed noted.  A: Met with pt 1:1. PO vs IM explained to pt. Pt insisted IM worked better than PO and will not that them. IM administered as prescribed. Support and encouragement provided.  R: Patient remains safe.

## 2015-04-19 NOTE — Progress Notes (Signed)
Received call From MD at [facility] - regarding [name].  Per MD , Patients on Forced medication order,  if accepted will need to wait for 2 weeks at the [facility] for restarting them on Forced medication order , since according to [facility] policy , they have to meet a committee for forced medication approval . This usually takes 2-3 weeks . Per MD , if patient starts taking PO medications for a couple of days , then could accept her . Marland Kitchen  Jomarie Longs ,MD

## 2015-04-19 NOTE — Plan of Care (Signed)
Problem: Alteration in thought process Goal: STG-Patient is able to discuss thoughts with staff Outcome: Progressing Pt is irritable and argumentative refusing to talk to writer except taking medication and going back to bad.

## 2015-04-19 NOTE — BHH Group Notes (Signed)
[facility] LCSW Aftercare Discharge Planning Group Note   04/19/2015 9:30 AM  Participation Quality: Limited  Mood/Affect: Flat  Depression Rating: denies  Anxiety Rating: denies  Thoughts of Suicide: No Will you contract for safety? NA  Current AVH: Yes  Plan for Discharge/Comments:Attends groups but limited participation, appears to be experiencing AH.  Transfer request to [facility] at guardian request  [employer]: sheriff as appropriate, IVC  Supports: has guardian   Santa Genera, LCSW Clinical Social Worker

## 2015-04-19 NOTE — Progress Notes (Signed)
Psychoeducational Group Note  Date:  04/19/2015 Time:  2054  Group Topic/Focus:  Wrap-Up Group:   The focus of this group is to help patients review their daily goal of treatment and discuss progress on daily workbooks.  Participation Level: Did Not Attend  Participation Quality:  Not Applicable  Affect:  Not Applicable  Cognitive:  Not Applicable  Insight:  Not Applicable  Engagement in Group: Not Applicable  Additional Comments:  The patient did not attend group this evening since she was asleep in her bedroom.   Hazle Coca S 04/19/2015, 8:53 PM

## 2015-04-19 NOTE — Clinical Social Work Note (Signed)
CSW spoke w April, Clinical cytogeneticist for [place].  MD at [facility] has reviewed patient's information and has decided not to accept her at their facility.  Per MD, because patient currently paranoid and on forced medications/efusing PO and getting IM injections.  MD expresses concern that forced med policy at [place] will delay appropriate care at outside facility.  Will not accept at [facility] at this time.  MD notified.    Santa Genera, LCSW Clinical Social Worker

## 2015-04-20 DIAGNOSIS — F200 Paranoid schizophrenia: Principal | ICD-10-CM

## 2015-04-20 NOTE — BHH Group Notes (Signed)
BHH Group Notes:  (Clinical Social Work)  04/20/2015  11:15-12:00PM  Summary of Progress/Problems:   The main focus of today's process group was to discuss patients' feelings related to being hospitalized, as well as the difference between "being" and "having" a mental health diagnosis.  It was agreed in general by the group that it would be preferable to avoid future hospitalizations, and we discussed means of doing that.  As a follow-up, problems with adhering to medication recommendations were discussed.  The patient expressed their primary feeling about being hospitalized is that "being put under involuntary commitment never feels good."  She was irritable and flat, and expressed increasing agitation while talking about the wrong things being done by police.  She stated she is supposed to be discharged to home to convalesce and does not need any help from hospital staff.  Type of Therapy:  Group Therapy - Process  Participation Level:  Minimal  Participation Quality:  Resistant  Affect:  Flat and Irritable  Cognitive:  Delusional  Insight:  Poor  Engagement in Therapy:  Resistant  Modes of Intervention:  Exploration, Discussion  Ambrose Mantle, LCSW 04/20/2015, 12:23 PM

## 2015-04-20 NOTE — Progress Notes (Signed)
D: "according to [employer] it is against the law to give this many medication". Pt reports she is taking too many medication but reports IM 's are ok. Patient irritable pacing in room.  Pt denies SI/HI/AVH and pain. No physical distressed noted.  A: Met with pt 1:1.  Medication administered as prescribed. Support and encouragement provided.  R: Patient remains safe.

## 2015-04-20 NOTE — Progress Notes (Signed)
D:Pt guarded on approach. Forwards little. Pt continues to refuse PO medication. "I do not want pills, I'm trying to get rid of toxins in my body." Paranoid thought content. "Why you keep asking me questions, I'm not here for my mental hygiene." Delusional.  Pt denies SI/HI. Denies AVH. Pt attended nursing group on the unit. Minimal participation. "Goals I do not set."   A:Special checks q 15 mins in place for safety. IM medication give, force medication order in place. Pt reports she wants the injection. Support and encouragement provided.  R:Safety maintained. Compliant with IM medication regimen. Will continue to monitor.

## 2015-04-20 NOTE — Progress Notes (Signed)
[facility] MD Progress Note  04/20/2015  [name]  MRN:  [id number] Subjective:  Pt states: " I being held against my will by the [facility]. I am being detained and some people were released. And my family is under the investigation but the [facility]." She has been refusing oral medication, and has been getting syringes. "I been eating two or three syringes. I didn't support the Sheriff and I didn't wrong him either and now they are pissed off at that. I cant pig out here they are putting drugs in the food'   Objective: Pt seen and chart reviewed.I have also reviewed notes in EHR from previous days . Patient is a 11 y old AAF with hx of schizophrenia , brought in [facility] for aggressive , disorganized behavior at home. Pt per initial notes - was aggressive with family , paranoid and delusional about cable guy who came to her house .   Pt today continues to be very  disorganized , delusional , and uncooperative . This has been patient's presentation since admission and she was able to sit through this assessment. Pt continues to lack insight in to her illness . She continues to refuse medications requiring forced medication use. She continues to be focused on Government as well as delusions of networks placed in her house by the government. She continues to endorse that people are out to get her, poisoning the food and medications.   Per staff pt continues to be noncompliant with medications off and on. Pt continues to need a lot of support.Tolerating medications well. Denies side effects.   Principal Problem: Schizophrenia, paranoid (HCC) Diagnosis:   Patient Active Problem List   Diagnosis Date Noted  . Noncompliance with diet and medication regimen [Z91.11, Z91.14]   . Psychoses [F29]   . Hypokalemic alkalosis [E87.3] 11/11/2011  . Medically noncompliant [Z91.19] 11/11/2011  . Schizophrenia, paranoid [F20.0] 11/07/2011   Total Time spent with patient: 25 minutes   Past Medical History:  Past  Medical History  Diagnosis Date  . Schizophrenia   . Non compliance w medication regimen     Family History: Pt did not express any hx of mental illness in family.  Social History:  History  Alcohol Use  . Yes    Comment: Refuses to disclose how much     History  Drug Use No    Comment: Refuses to answer    Social History   Social History  . Marital Status: Single    Spouse Name: [name]  . Number of Children: [name]  . Years of Education: [name]   Social History Main Topics  . Smoking status: Current Every Day Smoker  . Smokeless tobacco: None  . Alcohol Use: Yes     Comment: Refuses to disclose how much  . Drug Use: No     Comment: Refuses to answer  . Sexual Activity: Not Asked     Comment: refused to answer   Other Topics Concern  . None   Social History Narrative   Additional History:    Sleep: pt did not report any sleep issues  Appetite:  Fair   Musculoskeletal: Strength & Muscle Tone: within normal limits Gait & Station: normal Patient leans: [name]   Psychiatric Specialty Exam: Physical Exam  Constitutional: She appears well-developed.  HENT:  Head: Normocephalic.  Eyes: Pupils are equal, round, and reactive to light.  Neck: Normal range of motion.  Musculoskeletal: Normal range of motion.  Neurological: She is alert.  Skin: Skin  is warm and dry.    Review of Systems  Unable to perform ROS: mental acuity  Psychiatric/Behavioral: Positive for depression, hallucinations and memory loss. Negative for suicidal ideas and substance abuse. The patient is nervous/anxious. The patient does not have insomnia.   All other systems reviewed and are negative.   Blood pressure 90/56, pulse 59, temperature 98.3 F (36.8 C), temperature source Oral, resp. rate 16, height  (1.6 m), weight 63.957 kg (141 lb).Body mass index is 24.98 kg/(m^2).  General Appearance: Bizarre, Disheveled  Eye Contact:: fair  Speech: Pressured  Volume: Increased  Mood: Angry,  Anxious, Dysphoric and Irritable  Affect: Inappropriate and Labile  Thought Process: Disorganized, tangential   Orientation:is Ox3 to person , place , time  Thought Content:seen responding to internal stimuli, is paranoid , delusional  Suicidal Thoughts: pt did not express any-is uncooperative with part of evaluation  Homicidal Thoughts:unable to assess  Memory:unable to assess- seems to be intact grossly - immediate - fair , recent - fair  Judgement: Impaired  Insight: Lacking  Psychomotor Activity: Increased  Concentration: Poor  Recall: Poor  Fund of Knowledge:Poor  Language: Poor  Akathisia: No  Handed:   AIMS (if indicated):    Assets:access to health care  ADL's: Impaired  Cognition: WNL           Sleep:  Number of Hours: 6.25     Current Medications: Current Facility-Administered Medications  Medication Dose Route Frequency Provider Last Rate Last Dose  . acetaminophen (TYLENOL) tablet 650 mg  650 mg Oral Q6H PRN Charm Rings, NP      . alum & mag hydroxide-simeth (MAALOX/MYLANTA) 200-200-20 MG/5ML suspension 30 mL  30 mL Oral Q4H PRN Charm Rings, NP      . benztropine (COGENTIN) tablet 0.5 mg  0.5 mg Oral BID Jomarie Longs, MD   0.5 mg at 04/16/15 1610   Or  . benztropine mesylate (COGENTIN) injection 0.5 mg  0.5 mg Intramuscular BID Jomarie Longs, MD   0.5 mg at 04/20/15 0902  . carbamazepine (TEGRETOL) chewable tablet 300 mg  300 mg Oral BID PC Saramma Eappen, MD   300 mg at 04/16/15 1714  . diphenhydrAMINE (BENADRYL) capsule 25 mg  25 mg Oral Q8H PRN Jomarie Longs, MD       Or  . diphenhydrAMINE (BENADRYL) injection 25 mg  25 mg Intramuscular Q8H PRN Saramma Eappen, MD      . haloperidol (HALDOL) tablet 5 mg  5 mg Oral Q8H PRN Jomarie Longs, MD       Or  . haloperidol lactate (HALDOL) injection 5 mg  5 mg Intramuscular Q8H PRN Saramma Eappen, MD      . haloperidol (HALDOL) tablet 7.5 mg  7.5 mg Oral BID Jomarie Longs,  MD       Or  . haloperidol lactate (HALDOL) injection 7.5 mg  7.5 mg Intramuscular BID Jomarie Longs, MD   7.5 mg at 04/20/15 0903  . hydrOXYzine (ATARAX/VISTARIL) tablet 25 mg  25 mg Oral BID PC Charm Rings, NP   25 mg at 04/16/15 9604  . LORazepam (ATIVAN) tablet 1 mg  1 mg Oral Q8H PRN Jomarie Longs, MD       Or  . LORazepam (ATIVAN) injection 1 mg  1 mg Intramuscular Q8H PRN Saramma Eappen, MD      . magnesium hydroxide (MILK OF MAGNESIA) suspension 30 mL  30 mL Oral Daily PRN Charm Rings, NP      .  OLANZapine (ZYPREXA) tablet 5 mg  5 mg Oral QHS Jomarie Longs, MD       Or  . OLANZapine (ZYPREXA) injection 5 mg  5 mg Intramuscular QHS Jomarie Longs, MD   5 mg at 04/19/15 2149  . traZODone (DESYREL) tablet 50 mg  50 mg Oral QHS PRN Charm Rings, NP        Lab Results:  No results found for this or any previous visit (from the past 48 hour(s)).  Physical Findings: AIMS: Facial and Oral Movements Muscles of Facial Expression: None, normal Lips and Perioral Area: None, normal Jaw: None, normal Tongue: None, normal,Extremity Movements Upper (arms, wrists, hands, fingers): None, normal Lower (legs, knees, ankles, toes): None, normal, Trunk Movements Neck, shoulders, hips: None, normal, Overall Severity Severity of abnormal movements (highest score from questions above): None, normal Incapacitation due to abnormal movements: None, normal Patient's awareness of abnormal movements (rate only patient's report): No Awareness, Dental Status Current problems with teeth and/or dentures?: No Does patient usually wear dentures?: No  CIWA:    COWS:      Assessment: Pt is a 64 y old AAF who has a hx of schizophrenia , presented very disorganized with persecutory delusions as well as aggressive behavior. Pt continues to be uncooperative , delusional and paranoid. Currently on Forced medication order- will continue treatment. Per Collateral  information obtained from Family-per CSW -  Sister is her legal guardian. Pt per sister will benefit from [facility] hospital and needs to be transferred as soon as bed available. CSW to refer patient for transfer.   Treatment Plan Summary: Daily contact with patient to assess and evaluate symptoms and progress in treatment and Medication management  Medications:  -Reduce Haldol to 7.5  mg po/im bid for psychosis , Cogentin 0.5 mg po/IM for EPS . Pt is on Forced medication order. -Will add Zyprexa 5 mg po /im at bedtime to augment the effect of Haldol.  -Increased Tegretol to  po bid for psychosis -Continue Haldol  PO or IM q8h prn for agitation. -Continue Benadryl 25 mg PO/IM (EPS), Ativan 1 mg po/IM q8h prn for agitation . -Continue Trazodone  qhs prn insomnia -Reviewed EKG- wnl , TSH - wnl , lipid panel- wnl , Hba1c- 6.1 - dietician consult when pt is more stable  , PL- wnl. CSW will work on [place] referral for placement.  Medical Decision Making:  Review of Psycho-Social Stressors (1), Review or order clinical lab tests (1), Review of Medication Regimen & Side Effects (2) and Review of New Medication or Change in Dosage (2)   STARKES, TAKIA S, FNP-BC 04/20/2015,12:16 PM  Agree with Progress Note as above  Nehemiah Massed, MD

## 2015-04-20 NOTE — [facility] Group Notes (Signed)
[facility] Group Notes:  (Nursing/MHT/Case Management/Adjunct)  Date:  04/20/2015  Time: 0900  Type of Therapy:  Nurse Education  Participation Level:  Minimal  Participation Quality:  Resistant  Affect:  Flat  Cognitive:  Lacking  Insight:  Lacking  Engagement in Group:  Lacking  Modes of Intervention:  Discussion, Orientation, Socialization and Support  Summary of Progress/Problems:  [name] 04/20/2015, 9:38 AM

## 2015-04-20 NOTE — Progress Notes (Signed)
Adult Psychoeducational Group Note  Date:  04/20/2015 Time:  8:52 PM  Group Topic/Focus:  Wrap-Up Group:   The focus of this group is to help patients review their daily goal of treatment and discuss progress on daily workbooks.  Participation Level:  Active  Participation Quality:  Appropriate  Affect:  Appropriate  Cognitive:  Appropriate  Insight: Appropriate  Engagement in Group:  Engaged  Modes of Intervention:  Discussion  Additional Comments: The patient express that she rates her day a 5.The patient also said that her a was okay.  Octavio Manns 04/20/2015, 8:52 PM

## 2015-04-20 NOTE — Plan of Care (Signed)
Problem: Ineffective individual coping Goal: STG: Patient will remain free from self harm Outcome: Progressing Pt denies SI, medication administered as prescribed, pt free of self harm.

## 2015-04-21 NOTE — Plan of Care (Signed)
Problem: Ineffective individual coping Goal: STG: Patient will remain free from self harm Outcome: Progressing Pt has remained free from self harm     

## 2015-04-21 NOTE — [facility] Group Notes (Signed)
[facility] Group Notes:  (Clinical Social Work)  04/21/2015  [facility] Group Notes:  (Clinical Social Work)  04/21/2015  11:00AM-12:00PM  Summary of Progress/Problems:  The main focus of today's process group was to listen to a variety of genres of music and to identify that different types of music provoke different responses.  The patient then was able to identify personally what was soothing for them, as well as energizing.  Handouts were used to record feelings evoked, as well as how patient can personally use this knowledge in sleep habits, with depression, and with other symptoms.  The patient expressed little during group and said almost every song made her feel nothing.  Her lips moved throughout group and her eyes seemed to be looking at someone in front of her.  Type of Therapy:  Music Therapy   Participation Level:  Limited  Participation Quality:  Resistant  Affect:  Flat and irritable  Cognitive:  Hallucinating  Insight:  Poor  Engagement in Therapy:  Limited  Modes of Intervention:   Activity, Exploration  Ambrose Mantle, LCSW 04/21/2015

## 2015-04-21 NOTE — Progress Notes (Signed)
[facility] MD Progress Note  04/21/2015  [name]  MRN:  [id number] Subjective:  Pt states that she is feeling much better today. She continues to endorse that she is being held against her will. She reports sleeping well and increased appetite(ate breakfast and lunch). She is inquiring about having her laundry done today, although she doesn't want to place her belongings in the hallway.    Objective: Pt seen and chart reviewed.I have also reviewed notes in EHR from previous days . Patient is a 34 y old AAF with hx of schizophrenia , brought in [facility] for aggressive , disorganized behavior at home. Pt per initial notes - was aggressive with family , paranoid and delusional about cable guy who came to her house .   Pt today continues to be very delusional and uncooperative with medication, however seems more coherent with organized though process. She was observed looking out the window in her room.  Pt continues to lack insight in to her illness . She continues to refuse medications requiring forced medication use. She continues to be focused on Government as well as delusions of networks placed in her house by the government. She continues to endorse that people are out to get her, poisoning the food and medications. Per staff pt continues to be noncompliant with medications off and on. Pt continues to need a lot of support.Tolerating medications well. Denies side effects.   Principal Problem: Schizophrenia, paranoid (HCC) Diagnosis:   Patient Active Problem List   Diagnosis Date Noted  . Noncompliance with diet and medication regimen [Z91.11, Z91.14]   . Psychoses [F29]   . Hypokalemic alkalosis [E87.3] 11/11/2011  . Medically noncompliant [Z91.19] 11/11/2011  . Schizophrenia, paranoid (HCC) [F20.0] 11/07/2011   Total Time spent with patient: 25 minutes   Past Medical History:  Past Medical History  Diagnosis Date  . Schizophrenia   . Non compliance w medication regimen     Family  History: Pt did not express any hx of mental illness in family.  Social History:  History  Alcohol Use  . Yes    Comment: Refuses to disclose how much     History  Drug Use No    Comment: Refuses to answer    Social History   Social History  . Marital Status: Single    Spouse Name: [name]  . Number of Children: [name]  . Years of Education: [name]   Social History Main Topics  . Smoking status: Current Every Day Smoker  . Smokeless tobacco: None  . Alcohol Use: Yes     Comment: Refuses to disclose how much  . Drug Use: No     Comment: Refuses to answer  . Sexual Activity: Not Asked     Comment: refused to answer   Other Topics Concern  . None   Social History Narrative   Additional History:    Sleep: Good  Appetite:  Good   Musculoskeletal: Strength & Muscle Tone: within normal limits Gait & Station: normal Patient leans: [name]   Psychiatric Specialty Exam: Physical Exam  Constitutional: She appears well-developed.  HENT:  Head: Normocephalic.  Eyes: Pupils are equal, round, and reactive to light.  Neck: Normal range of motion.  Musculoskeletal: Normal range of motion.  Neurological: She is alert.  Skin: Skin is warm and dry.    Review of Systems  Psychiatric/Behavioral: Positive for depression and hallucinations. Negative for suicidal ideas, memory loss and substance abuse. The patient is nervous/anxious. The patient does not have insomnia.  All other systems reviewed and are negative.   Blood pressure 102/63, pulse 58, temperature 97.6 F (36.4 C), temperature source Oral, resp. rate 18, height  (1.6 m), weight 63.957 kg (141 lb).Body mass index is 24.98 kg/(m^2).  General Appearance: Disheveled  Eye Contact:: fair  Speech: Pressured  Volume: Increased  Mood: Anxious, Dysphoric  Affect: Inappropriate and   Thought Process: WDL, tangential   Orientation:is Ox3 to person , place , time  Thought Content:seen responding to internal stimuli,  delusional  Suicidal Thoughts: Denies  Homicidal Thoughts:Denies  Memory:unable to assess- seems to be intact grossly - immediate - fair , recent - fair  Judgement: Impaired  Insight: Lacking  Psychomotor Activity: normal  Concentration: Poor  Recall: Poor  Fund of Knowledge:Poor  Language: Poor  Akathisia: No  Handed:   AIMS (if indicated):    Assets:access to health care  ADL's: Impaired  Cognition: WNL           Sleep:  Number of Hours: 6.75     Current Medications: Current Facility-Administered Medications  Medication Dose Route Frequency Provider Last Rate Last Dose  . acetaminophen (TYLENOL) tablet 650 mg  650 mg Oral Q6H PRN Charm Rings, NP      . alum & mag hydroxide-simeth (MAALOX/MYLANTA) 200-200-20 MG/5ML suspension 30 mL  30 mL Oral Q4H PRN Charm Rings, NP      . benztropine (COGENTIN) tablet 0.5 mg  0.5 mg Oral BID Jomarie Longs, MD   0.5 mg at 04/16/15 9629   Or  . benztropine mesylate (COGENTIN) injection 0.5 mg  0.5 mg Intramuscular BID Jomarie Longs, MD   0.5 mg at 04/21/15 0752  . carbamazepine (TEGRETOL) chewable tablet 300 mg  300 mg Oral BID PC Saramma Eappen, MD   300 mg at 04/16/15 1714  . diphenhydrAMINE (BENADRYL) capsule 25 mg  25 mg Oral Q8H PRN Jomarie Longs, MD       Or  . diphenhydrAMINE (BENADRYL) injection 25 mg  25 mg Intramuscular Q8H PRN Saramma Eappen, MD      . haloperidol (HALDOL) tablet 5 mg  5 mg Oral Q8H PRN Jomarie Longs, MD       Or  . haloperidol lactate (HALDOL) injection 5 mg  5 mg Intramuscular Q8H PRN Saramma Eappen, MD      . haloperidol (HALDOL) tablet 7.5 mg  7.5 mg Oral BID Jomarie Longs, MD       Or  . haloperidol lactate (HALDOL) injection 7.5 mg  7.5 mg Intramuscular BID Jomarie Longs, MD   7.5 mg at 04/21/15 0758  . hydrOXYzine (ATARAX/VISTARIL) tablet 25 mg  25 mg Oral BID PC Charm Rings, NP   25 mg at 04/16/15 5284  . LORazepam (ATIVAN) tablet 1 mg  1 mg Oral Q8H PRN Jomarie Longs, MD       Or  . LORazepam (ATIVAN) injection 1 mg  1 mg Intramuscular Q8H PRN Saramma Eappen, MD      . magnesium hydroxide (MILK OF MAGNESIA) suspension 30 mL  30 mL Oral Daily PRN Charm Rings, NP      . OLANZapine (ZYPREXA) tablet 5 mg  5 mg Oral QHS Jomarie Longs, MD       Or  . OLANZapine (ZYPREXA) injection 5 mg  5 mg Intramuscular QHS Jomarie Longs, MD   5 mg at 04/20/15 2126  . traZODone (DESYREL) tablet 50 mg  50 mg Oral QHS PRN Charm Rings, NP  Lab Results:  No results found for this or any previous visit (from the past 48 hour(s)).  Physical Findings: AIMS: Facial and Oral Movements Muscles of Facial Expression: None, normal Lips and Perioral Area: None, normal Jaw: None, normal Tongue: None, normal,Extremity Movements Upper (arms, wrists, hands, fingers): None, normal Lower (legs, knees, ankles, toes): None, normal, Trunk Movements Neck, shoulders, hips: None, normal, Overall Severity Severity of abnormal movements (highest score from questions above): None, normal Incapacitation due to abnormal movements: None, normal Patient's awareness of abnormal movements (rate only patient's report): No Awareness, Dental Status Current problems with teeth and/or dentures?: No Does patient usually wear dentures?: No  CIWA:    COWS:      Assessment: Pt is a 8 y old AAF who has a hx of schizophrenia , presented very disorganized with persecutory delusions as well as aggressive behavior.She is encouraged to take oral medications instead of injections, however she declines at this time. Currently on Forced medication order- will continue treatment.Per Collateral  information obtained from Family-per CSW - Sister is her legal guardian. Pt per sister will benefit from [facility] hospital and needs to be transferred as soon as bed available. CSW to refer patient for transfer.   Treatment Plan Summary: Daily contact with patient to assess and evaluate symptoms and progress in  treatment and Medication management  Medications:  -Reduce Haldol to 7.5  mg po/im bid for psychosis , Cogentin 0.5 mg po/IM for EPS . Pt is on Forced medication order. -Will add Zyprexa 5 mg po /im at bedtime to augment the effect of Haldol.  -Increased Tegretol to  po bid for psychosis -Continue Haldol  PO or IM q8h prn for agitation. -Continue Benadryl 25 mg PO/IM (EPS), Ativan 1 mg po/IM q8h prn for agitation . -Continue Trazodone  qhs prn insomnia -Reviewed EKG- wnl , TSH - wnl , lipid panel- wnl , Hba1c- 6.1 - dietician consult when pt is more stable  , PL- wnl. CSW will work on [place] referral for placement.  Medical Decision Making:  Review of Psycho-Social Stressors (1), Review or order clinical lab tests (1), Review of Medication Regimen & Side Effects (2) and Review of New Medication or Change in Dosage (2)   STARKES, Kedar Sedano S, FNP-BC 04/21/2015,12:16 PM

## 2015-04-21 NOTE — Progress Notes (Signed)
D: Pt reports her day was busy. Pt pleasant reports she is doing well. Pt continue to refuse PO medication. Pt denies SI/HI/AVH and pain. No physical distressed noted.  A: Met with pt 1:1.  Medication administered as prescribed. Support and encouragement provided.  R: Patient remains safe.

## 2015-04-21 NOTE — [facility] Group Notes (Signed)
[facility] Group Notes:  (Nursing/MHT/Case Management/Adjunct)  Date:  04/21/2015  Time: 0900  Type of Therapy:  Nurse Education  Participation Level:  Active  Participation Quality:  Attentive and Resistant  Affect:  Flat  Cognitive:  Appropriate  Insight:  Improving  Engagement in Group:  Resistant  Modes of Intervention:  Education, Orientation, Socialization and Support  Summary of Progress/Problems:  [name] 04/21/2015, 10:17 AM

## 2015-04-21 NOTE — Progress Notes (Signed)
Initial Nutrition Assessment  DOCUMENTATION CODES:   Not applicable  INTERVENTION:  1.  General healthful diet; encourage intake of foods and beverages as able.  RD to follow and assess for nutritional adequacy. Patient delines supplements at this time.    NUTRITION DIAGNOSIS:   Inadequate oral intake related to lethargy/confusion as evidenced by per patient/family report.  GOAL:   Patient will meet greater than or equal to 90% of their needs  MONITOR:   PO intake, Weight trends  REASON FOR ASSESSMENT:   Malnutrition Screening Tool    ASSESSMENT:   54 y.o. female with known hx of schizophrenia brought to ED under IVC due to aggression in her home. RD met with patient who states she is eating well.  Pt reports she "only eats one serving and doesn't go back for seconds." She admits she occasionally does not eat all of her meal. She feels her intake is adequate. She refuses supplements at this time.   Diet Order:  Diet Heart Room service appropriate?: Yes; Fluid consistency:: Thin  Skin:   intact  Last BM:  9/26  Height:   Ht Readings from Last 1 Encounters:  04/12/15 _0  (1.6 m)    Weight:   Wt Readings from Last 1 Encounters:  04/12/15 141 lb (63.957 kg)    Ideal Body Weight:     BMI:  Body mass index is 24.98 kg/(m^2).  Estimated Nutritional Needs:   Kcal:  1800-2000  Protein:  50-65g  Fluid:  2 L/d  EDUCATION NEEDS: Addressed with patient     Weekend RD coverage:  Brynda Greathouse, MS RD LDN Weekend/After hours pager: [phone]

## 2015-04-21 NOTE — Progress Notes (Signed)
D: Pt continues to be resistive to treatment. Pt requested hygiene supplies to take shower. Pt irritable on approach and continues to have paranoid thought content. Pt attended nursing group on the unit with minimal interaction "I don't set goals." Pt denies SI/HI. Denies AVH. Denies physical pain.   A:Special checks q 15 mins in place for safety. Pt continues to refuse PO medications and reports she wants the injections. Force medications given. Encouragement and support provided. Hygiene items provided.   R: Safety maintained. Will continue to monitor.

## 2015-04-21 NOTE — Progress Notes (Signed)
Adult Psychoeducational Group Note  Date:  04/21/2015 Time:  10:33 PM  Group Topic/Focus:  Wrap-Up Group:   The focus of this group is to help patients review their daily goal of treatment and discuss progress on daily workbooks.  Participation Level:  Did Not Attend  Participation Quality:  Did not attend  Affect:  Did not attend  Cognitive:  Did not attend  Insight: None  Engagement in Group:  Did not attend  Modes of Intervention:  Did not attend  Additional Comments:  Patient did not attend wrap up group tonight.   Treyten Monestime L Breon Rehm 04/21/2015, 10:33 PM

## 2015-04-22 MED ORDER — HALOPERIDOL LACTATE 5 MG/ML IJ SOLN
10.0000 mg | Freq: Every day | INTRAMUSCULAR | Status: DC
  Administered 2015-04-23 – 2015-04-24 (×2): 10 mg via INTRAMUSCULAR
  Filled 2015-04-22 (×4): qty 2

## 2015-04-22 MED ORDER — HALOPERIDOL 5 MG PO TABS
15.0000 mg | ORAL_TABLET | Freq: Every day | ORAL | Status: DC
  Filled 2015-04-22 (×3): qty 3

## 2015-04-22 MED ORDER — OLANZAPINE 7.5 MG PO TABS
7.5000 mg | ORAL_TABLET | Freq: Every day | ORAL | Status: DC
  Filled 2015-04-22 (×3): qty 1

## 2015-04-22 MED ORDER — OLANZAPINE 10 MG IM SOLR
7.5000 mg | Freq: Every day | INTRAMUSCULAR | Status: DC
  Administered 2015-04-22 – 2015-04-23 (×2): 7.5 mg via INTRAMUSCULAR
  Filled 2015-04-22 (×3): qty 10

## 2015-04-22 MED ORDER — HALOPERIDOL 5 MG PO TABS
7.5000 mg | ORAL_TABLET | Freq: Two times a day (BID) | ORAL | Status: AC
  Filled 2015-04-22: qty 2

## 2015-04-22 MED ORDER — BENZTROPINE MESYLATE 0.5 MG PO TABS
0.5000 mg | ORAL_TABLET | Freq: Every day | ORAL | Status: DC
  Filled 2015-04-22 (×5): qty 1

## 2015-04-22 MED ORDER — HALOPERIDOL LACTATE 5 MG/ML IJ SOLN
7.5000 mg | Freq: Two times a day (BID) | INTRAMUSCULAR | Status: AC
  Administered 2015-04-22: 7.5 mg via INTRAMUSCULAR
  Filled 2015-04-22: qty 1.5

## 2015-04-22 MED ORDER — BENZTROPINE MESYLATE 1 MG/ML IJ SOLN
0.5000 mg | Freq: Every day | INTRAMUSCULAR | Status: DC
  Administered 2015-04-23 – 2015-04-25 (×3): 0.5 mg via INTRAMUSCULAR
  Filled 2015-04-22: qty 2
  Filled 2015-04-22 (×5): qty 0.5

## 2015-04-22 NOTE — Progress Notes (Signed)
[facility] MD Progress Note  04/22/2015  [name]  MRN:  [id number] Subjective:  Pt states " I am not going to take any oral pills. I am pissed off by the routine here.'    Objective: Pt seen and chart reviewed.I have also reviewed notes in EHR from previous days . Patient is a 28 y old AAF with hx of schizophrenia , brought in [facility] for aggressive , disorganized behavior at home. Pt per initial notes - was aggressive with family , paranoid and delusional about cable guy who came to her house .   Pt today continues to be very delusional and uncooperative. She continues to be irrelevant , talking about people holding her here more than three days and that she is being poisoned. Pt continues to lack insight in to her illness . She continues to refuse medications requiring forced medication use.  Per staff pt continues to be noncompliant with medications . Pt continues to need a lot of support.Tolerating medications well. Denies side effects.   Principal Problem: Schizophrenia, paranoid (HCC) Diagnosis:   Patient Active Problem List   Diagnosis Date Noted  . Noncompliance with diet and medication regimen [Z91.11, Z91.14]   . Psychoses [F29]   . Hypokalemic alkalosis [E87.3] 11/11/2011  . Medically noncompliant [Z91.19] 11/11/2011  . Schizophrenia, paranoid (HCC) [F20.0] 11/07/2011   Total Time spent with patient: 25 minutes   Past Medical History:  Past Medical History  Diagnosis Date  . Schizophrenia   . Non compliance w medication regimen     Family History: Pt did not express any hx of mental illness in family.  Social History:  History  Alcohol Use  . Yes    Comment: Refuses to disclose how much     History  Drug Use No    Comment: Refuses to answer    Social History   Social History  . Marital Status: Single    Spouse Name: [name]  . Number of Children: [name]  . Years of Education: [name]   Social History Main Topics  . Smoking status: Current Every Day Smoker  . Smokeless  tobacco: None  . Alcohol Use: Yes     Comment: Refuses to disclose how much  . Drug Use: No     Comment: Refuses to answer  . Sexual Activity: Not Asked     Comment: refused to answer   Other Topics Concern  . None   Social History Narrative   Additional History:    Sleep: Good  Appetite:  Good   Musculoskeletal: Strength & Muscle Tone: within normal limits Gait & Station: normal Patient leans: [name]   Psychiatric Specialty Exam: Physical Exam  Constitutional: She appears well-developed.  HENT:  Head: Normocephalic.  Eyes: Pupils are equal, round, and reactive to light.  Neck: Normal range of motion.  Musculoskeletal: Normal range of motion.  Neurological: She is alert.  Skin: Skin is warm and dry.    Review of Systems  Psychiatric/Behavioral: Positive for depression and hallucinations. Negative for suicidal ideas, memory loss and substance abuse. The patient is nervous/anxious. The patient does not have insomnia.   All other systems reviewed and are negative.   Blood pressure 102/69, pulse 60, temperature 98 F (36.7 C), temperature source Oral, resp. rate 18, height  (1.6 m), weight 63.957 kg (141 lb).Body mass index is 24.98 kg/(m^2).  General Appearance: Disheveled  Eye Contact:: fair  Speech: Pressured  Volume: Increased  Mood: Anxious, Dysphoric  Affect: Inappropriate and   Thought  Process: Tangential , Irrelevant  Orientation:is Ox3 to person , place , time  Thought Content:seen responding to internal stimuli, delusional  Suicidal Thoughts: Denies  Homicidal Thoughts:Denies  Memory:unable to assess- seems to be intact grossly - immediate - fair , recent - fair  Judgement: Impaired  Insight: Lacking  Psychomotor Activity: normal  Concentration: Poor  Recall: Poor  Fund of Knowledge:Poor  Language: Poor  Akathisia: No  Handed:   AIMS (if indicated):    Assets:access to health care  ADL's: Impaired  Cognition:  WNL           Sleep:  Number of Hours: 6.75     Current Medications: Current Facility-Administered Medications  Medication Dose Route Frequency Provider Last Rate Last Dose  . acetaminophen (TYLENOL) tablet 650 mg  650 mg Oral Q6H PRN Charm Rings, NP      . alum & mag hydroxide-simeth (MAALOX/MYLANTA) 200-200-20 MG/5ML suspension 30 mL  30 mL Oral Q4H PRN Charm Rings, NP      . Melene Muller ON 04/23/2015] benztropine (COGENTIN) tablet 0.5 mg  0.5 mg Oral Daily Jomarie Longs, MD       Or  . Melene Muller ON 04/23/2015] benztropine mesylate (COGENTIN) injection 0.5 mg  0.5 mg Intramuscular Daily Terrel Nesheiwat, MD      . carbamazepine (TEGRETOL) chewable tablet 300 mg  300 mg Oral BID PC Abie Killian, MD   300 mg at 04/16/15 1714  . diphenhydrAMINE (BENADRYL) capsule 25 mg  25 mg Oral Q8H PRN Jomarie Longs, MD       Or  . diphenhydrAMINE (BENADRYL) injection 25 mg  25 mg Intramuscular Q8H PRN Jomarie Longs, MD      . Melene Muller ON 04/23/2015] haloperidol (HALDOL) tablet 15 mg  15 mg Oral Daily Jomarie Longs, MD       Or  . Melene Muller ON 04/23/2015] haloperidol lactate (HALDOL) injection 10 mg  10 mg Intramuscular Daily Lemar Bakos, MD      . haloperidol (HALDOL) tablet 5 mg  5 mg Oral Q8H PRN Jomarie Longs, MD       Or  . haloperidol lactate (HALDOL) injection 5 mg  5 mg Intramuscular Q8H PRN Zorion Nims, MD      . haloperidol (HALDOL) tablet 7.5 mg  7.5 mg Oral BID Jomarie Longs, MD       Or  . haloperidol lactate (HALDOL) injection 7.5 mg  7.5 mg Intramuscular BID Jomarie Longs, MD      . hydrOXYzine (ATARAX/VISTARIL) tablet 25 mg  25 mg Oral BID PC Charm Rings, NP   25 mg at 04/16/15 4540  . LORazepam (ATIVAN) tablet 1 mg  1 mg Oral Q8H PRN Jomarie Longs, MD       Or  . LORazepam (ATIVAN) injection 1 mg  1 mg Intramuscular Q8H PRN Nuno Brubacher, MD      . magnesium hydroxide (MILK OF MAGNESIA) suspension 30 mL  30 mL Oral Daily PRN Charm Rings, NP      . OLANZapine (ZYPREXA)  tablet 7.5 mg  7.5 mg Oral QHS Jomarie Longs, MD       Or  . OLANZapine (ZYPREXA) injection 7.5 mg  7.5 mg Intramuscular QHS Floye Fesler, MD      . traZODone (DESYREL) tablet 50 mg  50 mg Oral QHS PRN Charm Rings, NP        Lab Results:  No results found for this or any previous visit (from the past 48 hour(s)).  Physical Findings:  AIMS: Facial and Oral Movements Muscles of Facial Expression: None, normal Lips and Perioral Area: None, normal Jaw: None, normal Tongue: None, normal,Extremity Movements Upper (arms, wrists, hands, fingers): None, normal Lower (legs, knees, ankles, toes): None, normal, Trunk Movements Neck, shoulders, hips: None, normal, Overall Severity Severity of abnormal movements (highest score from questions above): None, normal Incapacitation due to abnormal movements: None, normal Patient's awareness of abnormal movements (rate only patient's report): No Awareness, Dental Status Current problems with teeth and/or dentures?: No Does patient usually wear dentures?: No  CIWA:    COWS:      Assessment: Pt is a 31 y old AAF who has a hx of schizophrenia , presented very disorganized with persecutory delusions as well as aggressive behavior.She is encouraged to take oral medications instead of injections, however she declines at this time. Currently on Forced medication order- will continue treatment.Per Collateral  information obtained from Family-per CSW - Sister is her legal guardian. Pt per sister will benefit from [facility] and needs to be transferred as soon as bed available.  Will continue medications and observe in the unit.   Treatment Plan Summary: Daily contact with patient to assess and evaluate symptoms and progress in treatment and Medication management  Medications:  -Change Haldol to 15 mg po/ 10 mg Im daily for psychosis , Cogentin 0.5 mg po/IM for EPS . Pt is on Forced medication order. -Will increase Zyprexa to 7. 5 mg po /im at bedtime  to augment the effect of Haldol.  -Continue Tegretol 300 mg po bid for psychosis. But she has been refusing all PO medications. -Continue Haldol  PO or IM q8h prn for agitation. -Continue Benadryl 25 mg PO/IM (EPS), Ativan 1 mg po/IM q8h prn for agitation . -Continue Trazodone  qhs prn insomnia -Reviewed EKG- wnl , TSH - wnl , lipid panel- wnl , Hba1c- 6.1 - dietician consult when pt is more stable  , PL- wnl. CSW will work on [place] referral for placement.  Medical Decision Making:  Review of Psycho-Social Stressors (1), Review or order clinical lab tests (1), Review of Medication Regimen & Side Effects (2) and Review of New Medication or Change in Dosage (2)   Breella Vanostrand, MD 04/22/2015,2:16 PM

## 2015-04-22 NOTE — Plan of Care (Signed)
Problem: Alteration in thought process Goal: STG-Patient is able to sleep at least 6 hours per night Outcome: Progressing Pt isolative in room most of the evening. Pt able to sleep through the night

## 2015-04-22 NOTE — Progress Notes (Signed)
DAR Note: [name] has been visible on the unit.  She doesn't interact with peers.  She has been attending groups.  She denies any SI/HI.  She denies hearing voices.  She continues to refuse PO medication.  She did take her morning medication via injection.  She tolerated well.  She becomes irritable if asked too many questions.  She denies any pain. She appears to be in no physical distress.  She did complete her self inventory and reported that her depression, hopelessness and anxiety were all 0/10.  She wouldn't list a goal today.  Continued to encourage participation in group and unit activities.  Q 15 minute checks maintained for safety.

## 2015-04-22 NOTE — [facility] Group Notes (Signed)
[facility] LCSW Aftercare Discharge Planning Group Note   04/22/2015 3:38 PM  Participation Quality:  Minimal  Mood/Affect:  Blunted  Depression Rating:  denies  Anxiety Rating:  denies  Thoughts of Suicide:  No Will you contract for safety?   NA  Current AVH:  No  Plan for Discharge/Comments:  "I'm just waiting around to be released."  When asked about family visiting, states "I don't want any visitors.  They are nothing but backstabbers."  [employer]:   Supports:  [name] B

## 2015-04-22 NOTE — Progress Notes (Signed)
The focus of this group is to help patients review their daily goal of treatment and discuss progress on daily workbooks. Pt attended the evening group session but responded minimally to discussion prompts from the Writer. Pt shared that today was a good day, but that she had no goals for the day or week. "Goals hold you back from what you're really supposed to be doing." Pt reported having no additional needs from Nursing Staff this evening. Pt's affect was appropriate.

## 2015-04-22 NOTE — [facility] Group Notes (Signed)
[facility] LCSW Group Therapy  04/22/2015 1:15 pm  Type of Therapy: Process Group Therapy  Participation Level:  Active  Participation Quality:  Appropriate  Affect:  Flat  Cognitive:  Oriented  Insight:  Improving  Engagement in Group:  Limited  Engagement in Therapy:  Limited  Modes of Intervention:  Activity, Clarification, Education, Problem-solving and Support  Summary of Progress/Problems: Today's group addressed the issue of overcoming obstacles.  Patients were asked to identify their biggest obstacle post d/c that stands in the way of their on-going success, and then problem solve as to how to manage this. Sat quietly.  Stayed the whole time.  When questioned directly, stated that she is "tired of people pressing my buttons here.  The reason I don't take any medicaton is because I don't need any.  I need to be released."  [name] B 04/22/2015   3:45 PM

## 2015-04-22 NOTE — Plan of Care (Signed)
Problem: Alteration in thought process Goal: LTG-Patient verbalizes understanding importance med regimen (Patient verbalizes understanding of importance of medication regimen and need to continue outpatient care.)  Outcome: Not Progressing She continues to refuse PO medication.  She will take injection.  We will continue to encourage PO medication.

## 2015-04-23 NOTE — [facility] Group Notes (Signed)
[facility] LCSW Group Therapy  04/23/2015 , 1:22 PM   Type of Therapy:  Group Therapy  Participation Level:  Active  Participation Quality:  Attentive  Affect:  Appropriate  Cognitive:  Alert  Insight:  Improving  Engagement in Therapy:  Engaged  Modes of Intervention:  Discussion, Exploration and Socialization  Summary of Progress/Problems: Today's group focused on the term Diagnosis.  Participants were asked to define the term, and then pronounce whether it is a negative, positive or neutral term. Stayed the entire time, per usual.  Had nothing to say.  When asked to share something about herself, stated that she likes to cook.  Unwilling to give any more details.  [name] B 04/23/2015 , 1:22 PM

## 2015-04-23 NOTE — Progress Notes (Signed)
D   Pt has been in her room most of the shift    She took her medication in shot form without a problem   She has limited interaction but was polite while staff was administering her medication   She is guarded and paranoid and appears to be responding to intrusive thoughts  A   Verbal support given   Medications administered and effectiveness monitored   Q 15 min checks R   Pt is safe at present and remains in her room

## 2015-04-23 NOTE — Progress Notes (Signed)
   D: Pt lay down during the assessment.  Informed the writer that "everything is ok".  Pt wouldn't go into detail with the writer, but was very polite. Pt did reiterate that she'd rather have injections rather than po meds. No questions or concerns noted at this time.   A:  Support and encouragement was offered. 15 min checks continued for safety.  R: Pt remains safe.

## 2015-04-23 NOTE — Progress Notes (Addendum)
[facility] MD Progress Note  04/23/2015  Kendrea Cerritos  MRN:  [id number] Subjective:  Pt states " I am frustrated, I did not ask for this. I am not going to take these any PO medications.    Objective: Pt seen and chart reviewed.I have also reviewed notes in EHR from previous days . Patient is a 89 y old AAF with hx of schizophrenia , brought in [facility] for aggressive , disorganized behavior at home. Pt per initial notes - was aggressive with family , paranoid and delusional about cable guy who came to her house .   Pt today continues to be very delusional and uncooperative. She continues to not participate in the evaluation this AM. Pt seen as pacing around her room , when writer attempted to evaluate her. She continues to make irrelevant delusional statements about the government , her family as well as staff. She continues to be paranoid .  Pt continues to lack insight in to her illness . She continues to refuse medications requiring forced medication use.  Per staff pt reported that " I will take injections only and not pills " . Pt continues to need a lot of support.Tolerating medications well. Denies side effects.   Principal Problem: Schizophrenia, paranoid (HCC) Diagnosis:   Patient Active Problem List   Diagnosis Date Noted  . Noncompliance with diet and medication regimen [Z91.11, Z91.14]   . Psychoses [F29]   . Hypokalemic alkalosis [E87.3] 11/11/2011  . Medically noncompliant [Z91.19] 11/11/2011  . Schizophrenia, paranoid (HCC) [F20.0] 11/07/2011   Total Time spent with patient: 25 minutes   Past Medical History:  Past Medical History  Diagnosis Date  . Schizophrenia   . Non compliance w medication regimen     Family History: Pt did not express any hx of mental illness in family.  Social History:  History  Alcohol Use  . Yes    Comment: Refuses to disclose how much     History  Drug Use No    Comment: Refuses to answer    Social History   Social History  . Marital  Status: Single    Spouse Name: [name]  . Number of Children: [name]  . Years of Education: [name]   Social History Main Topics  . Smoking status: Current Every Day Smoker  . Smokeless tobacco: None  . Alcohol Use: Yes     Comment: Refuses to disclose how much  . Drug Use: No     Comment: Refuses to answer  . Sexual Activity: Not Asked     Comment: refused to answer   Other Topics Concern  . None   Social History Narrative   Additional History:    Sleep: Observed as fair - but pt did not answer  Appetite:  fair as observed , but pt did not respond   Musculoskeletal: Strength & Muscle Tone: within normal limits Gait & Station: normal Patient leans: [name]   Psychiatric Specialty Exam: Physical Exam  Constitutional: She appears well-developed.  HENT:  Head: Normocephalic.  Eyes: Pupils are equal, round, and reactive to light.  Neck: Normal range of motion.  Musculoskeletal: Normal range of motion.  Neurological: She is alert.  Skin: Skin is warm and dry.    Review of Systems  Psychiatric/Behavioral: Positive for depression and hallucinations. Negative for suicidal ideas, memory loss and substance abuse. The patient is nervous/anxious. The patient does not have insomnia.   All other systems reviewed and are negative.   Blood pressure 110/57, pulse 59, temperature  98 F (36.7 C), temperature source Oral, resp. rate 18, height  (1.6 m), weight 63.957 kg (141 lb).Body mass index is 24.98 kg/(m^2).  General Appearance: guarded  Eye Contact:: fair  Speech: Pressured  Volume: Increased  Mood: Anxious, Dysphoric  Affect: Inappropriate , irritable  Thought Process: Tangential , Irrelevant  Orientation:is Ox3 to person , place , time  Thought Content:seen responding to internal stimuli, delusional  Suicidal Thoughts: Denies  Homicidal Thoughts:Denies  Memory:unable to assess- seems to be intact grossly - immediate - fair , recent - fair  Judgement: Impaired   Insight: Lacking  Psychomotor Activity: normal  Concentration: Poor  Recall: Poor  Fund of Knowledge:Poor  Language: Poor  Akathisia: No  Handed:   AIMS (if indicated):    Assets:access to health care  ADL's: Impaired  Cognition: WNL           Sleep:  Number of Hours: 6.75     Current Medications: Current Facility-Administered Medications  Medication Dose Route Frequency Provider Last Rate Last Dose  . acetaminophen (TYLENOL) tablet 650 mg  650 mg Oral Q6H PRN Charm Rings, NP      . alum & mag hydroxide-simeth (MAALOX/MYLANTA) 200-200-20 MG/5ML suspension 30 mL  30 mL Oral Q4H PRN Charm Rings, NP      . benztropine (COGENTIN) tablet 0.5 mg  0.5 mg Oral Daily Liz Pinho, MD       Or  . benztropine mesylate (COGENTIN) injection 0.5 mg  0.5 mg Intramuscular Daily Jomarie Longs, MD   0.5 mg at 04/23/15 0828  . carbamazepine (TEGRETOL) chewable tablet 300 mg  300 mg Oral BID PC Reshunda Strider, MD   300 mg at 04/16/15 1714  . diphenhydrAMINE (BENADRYL) capsule 25 mg  25 mg Oral Q8H PRN Jomarie Longs, MD       Or  . diphenhydrAMINE (BENADRYL) injection 25 mg  25 mg Intramuscular Q8H PRN Ananda Caya, MD      . haloperidol (HALDOL) tablet 15 mg  15 mg Oral Daily Milan Clare, MD       Or  . haloperidol lactate (HALDOL) injection 10 mg  10 mg Intramuscular Daily Jomarie Longs, MD   10 mg at 04/23/15 0829  . haloperidol (HALDOL) tablet 5 mg  5 mg Oral Q8H PRN Jomarie Longs, MD       Or  . haloperidol lactate (HALDOL) injection 5 mg  5 mg Intramuscular Q8H PRN Wilna Pennie, MD      . hydrOXYzine (ATARAX/VISTARIL) tablet 25 mg  25 mg Oral BID PC Charm Rings, NP   25 mg at 04/16/15 9604  . LORazepam (ATIVAN) tablet 1 mg  1 mg Oral Q8H PRN Jomarie Longs, MD       Or  . LORazepam (ATIVAN) injection 1 mg  1 mg Intramuscular Q8H PRN Devlin Brink, MD      . magnesium hydroxide (MILK OF MAGNESIA) suspension 30 mL  30 mL Oral Daily PRN Charm Rings, NP      . OLANZapine (ZYPREXA) tablet 7.5 mg  7.5 mg Oral QHS Jomarie Longs, MD       Or  . OLANZapine (ZYPREXA) injection 7.5 mg  7.5 mg Intramuscular QHS Jomarie Longs, MD   7.5 mg at 04/22/15 2253  . traZODone (DESYREL) tablet 50 mg  50 mg Oral QHS PRN Charm Rings, NP        Lab Results:  No results found for this or any previous visit (from the past  48 hour(s)).  Physical Findings: AIMS: Facial and Oral Movements Muscles of Facial Expression: None, normal Lips and Perioral Area: None, normal Jaw: None, normal Tongue: None, normal,Extremity Movements Upper (arms, wrists, hands, fingers): None, normal Lower (legs, knees, ankles, toes): None, normal, Trunk Movements Neck, shoulders, hips: None, normal, Overall Severity Severity of abnormal movements (highest score from questions above): None, normal Incapacitation due to abnormal movements: None, normal Patient's awareness of abnormal movements (rate only patient's report): No Awareness, Dental Status Current problems with teeth and/or dentures?: No Does patient usually wear dentures?: No  CIWA:    COWS:      Assessment: Pt is a 29 y old AAF who has a hx of schizophrenia , presented very disorganized with persecutory delusions as well as aggressive behavior.She is encouraged to take oral medications instead of injections, however she declines at this time. Currently on Forced medication order- will continue treatment.Per Collateral  information obtained from Family-per CSW - Sister is her legal guardian. Pt per sister will benefit from [facility] and needs to be transferred as soon as bed available.  Will continue medications and observe in the unit.   Treatment Plan Summary: Daily contact with patient to assess and evaluate symptoms and progress in treatment and Medication management  Medications:  -Continue Haldol 15 mg po/ 10 mg Im daily for psychosis , Cogentin 0.5 mg po/IM for EPS . Pt is on Forced medication  order. -Will continue Zyprexa 7. 5 mg po /im at bedtime to augment the effect of Haldol.  -Continue Tegretol 300 mg po bid for psychosis. But she has been refusing all PO medications. -Continue Haldol 5mg  PO or IM q8h prn for agitation. -Continue Benadryl 25 mg PO/IM (EPS), Ativan 1 mg po/IM q8h prn for agitation . -Continue Trazodone 50mg  qhs prn insomnia -Reviewed EKG- wnl , TSH - wnl , lipid panel- wnl , Hba1c- 6.1 - dietician consult when pt is more stable  , PL- wnl. CSW will work on [place] referral for placement.CSW to obtain medical records from [place] hospital from patient's last hospitalization.  Medical Decision Making:  Review of Psycho-Social Stressors (1), Review or order clinical lab tests (1), Review of Medication Regimen & Side Effects (2) and Review of New Medication or Change in Dosage (2)   Natonya Finstad, MD 04/23/2015,11:22 AM

## 2015-04-23 NOTE — Progress Notes (Signed)
[facility] Group Notes:  (Nursing/MHT/Case Management/Adjunct)  Date:  04/23/2015  Time:  3:05 PM  Type of Therapy:  Psychoeducational Skills  Participation Level:  Minimal  Participation Quality:  Appropriate and Attentive  Affect:  Flat  Cognitive:  Alert and Appropriate  Insight:  Improving  Engagement in Group:  Improving  Modes of Intervention:  Discussion and Education  Summary of Progress/Problems:  Talked with group about psychiatric diagnoses and treatments.  Pt alert and attentive.    Bing Quarry 04/23/2015, 3:05 PM

## 2015-04-23 NOTE — Progress Notes (Signed)
D: Pt continues to be very flat and depressed on the unit today. Pt also continues to be very isolative and mostly stay in the room. Pt is irritable this morning and refused her PO medications stating that she does not feel safe in here. Pt denied physical pain, pt has a bad odor but refused to shower. Pt reported that her depression was a 0, her hopelessness was a 0, and that her anxiety was a 10. Pt reported being negative SI/HI, no AH/VH noted. A: 15 min checks continued for patient safety. R: Pts safety maintained.

## 2015-04-24 MED ORDER — OLANZAPINE 7.5 MG PO TABS
15.0000 mg | ORAL_TABLET | Freq: Every day | ORAL | Status: DC
  Filled 2015-04-24 (×4): qty 2

## 2015-04-24 MED ORDER — HALOPERIDOL 5 MG PO TABS
5.0000 mg | ORAL_TABLET | Freq: Every day | ORAL | Status: DC
  Filled 2015-04-24 (×3): qty 1

## 2015-04-24 MED ORDER — OLANZAPINE 10 MG IM SOLR
15.0000 mg | Freq: Every day | INTRAMUSCULAR | Status: DC
  Administered 2015-04-24 – 2015-04-25 (×2): 15 mg via INTRAMUSCULAR
  Filled 2015-04-24 (×4): qty 20

## 2015-04-24 MED ORDER — HALOPERIDOL LACTATE 5 MG/ML IJ SOLN
5.0000 mg | Freq: Every day | INTRAMUSCULAR | Status: DC
  Administered 2015-04-25: 5 mg via INTRAMUSCULAR
  Filled 2015-04-24 (×3): qty 1

## 2015-04-24 NOTE — Tx Team (Signed)
Interdisciplinary Treatment Plan Update (Adult)  Date:  04/24/2015   Time Reviewed:  3:43 PM   Progress in Treatment: Attending groups: Yes Participating in groups:  Yes Taking medication as prescribed:  Yes. Tolerating medication:  Yes. Family/Significant other contact made:  Yes Patient understands diagnosis:  No  Limited insight Discussing patient identified problems/goals with staff:  Yes, see initial care plan. Medical problems stabilized or resolved:  Yes. Denies suicidal/homicidal ideation: Yes. Issues/concerns per patient self-inventory:  No. Other:  New problem(s) identified:  Discharge Plan or Barriers:  Transfer to [facility]  Reason for Continuation of Hospitalization: Delusions  Medication stabilization Other; describe Paranoia  Comments:   [name] is a 54 y.o. female history of schizophrenia here presenting with agitation and aggressive behavior. The cable company was at her house earlier and she became aggressive towards her as well as towards her mother. She was called and she was IVC. On exam, patient refused to answer questions and feels that everyone is here to kill her and are evil. I was unable to examine her because she refused. She was given Ativan and Benadryl for agitation.  Second opinion delivered to force meds as pt was refusing.  She is now taking Haldol, Tegretol PO Estimated length of stay: 4-5 days  9/30:  Patient referred for transfer to [facility], IVC paperwork renewed.  Continues to have limited insight, experiencing AVH.  Is taking meds IM only, refuses PO  04/24/15:  Still taking meds IM only   Will re-refer to [facility] when taking meds PO  New goal(s):  Review of initial/current patient goals per problem list:   Review of initial/current patient goals per problem list:  1. Goal(s): Patient will participate in aftercare plan   Met: No   Target date: 3-5 days post admission date   As evidenced by: Patient will participate within  aftercare plan AEB aftercare provider and housing plan at discharge being identified.  04/19/15 Working on referral to [facility] per request of guardian 04/24/15  [facility] on grounds of her unwillingness to take meds PO        5. Goal(s): Patient will demonstrate decreased signs of psychosis  * Met: No  * Target date: 3-5 days post admission date  * As evidenced by: Patient will demonstrate decreased frequency of AVH or return to baseline function 04/15/15   Presents with paranoia, disorganization, mood lability due to non compliance with meds, oupt tx 04/19/15:  Continues to appear to respond to internal stimuli, AH, irritable, labile mood 04/24/15:  Mood is more stable.  Other symptoms remain.  Pt continues to refuse meds PO; has been getting IM since day one.       Attendees: Patient:  04/24/2015 3:43 PM   Family:   04/24/2015 3:43 PM   Physician:  Ursula Alert, MD 04/24/2015 3:43 PM   Nursing:   Manuella Ghazi, RN 04/24/2015 3:43 PM   CSW:    Ripley Fraise, [place]   04/24/2015 3:43 PM   Other:  04/24/2015 3:43 PM   Other:   04/24/2015 3:43 PM   Other:  Lars Pinks, Nurse CM 04/24/2015 3:43 PM   Other:  Lucinda Dell, Beverly Sessions TCT 04/24/2015 3:43 PM   Other:  Norberto Sorenson, [place]  04/24/2015 3:43 PM   Other:  04/24/2015 3:43 PM   Other:  04/24/2015 3:43 PM   Other:  04/24/2015 3:43 PM   Other:  04/24/2015 3:43 PM   Other:  04/24/2015 3:43 PM   Other:  04/24/2015 3:43 PM    Scribe for Treatment Team:   Edwyna Shell, LCSW Clinical Social Worker , 04/24/2015 3:43 PM

## 2015-04-24 NOTE — [facility] Group Notes (Signed)
[facility] LCSW Group Therapy  04/24/2015 1:27 PM  Type of Therapy: Group Therapy  Participation Level: Invited. Chose not to attend.  Summary of Progress/Problems: Onalee Hua from [employer] was here to tell his story of recovery and play his guitar.  Vito Backers. Beverely Pace 04/24/2015 1:27 PM

## 2015-04-24 NOTE — Progress Notes (Signed)
Patient ID: [name], female   DOB: 1961-04-24, 54 y.o.   MRN: [id number] D-In room most sitting in dark when writer approached to talk with her. She has attended two groups but doesn't stay for the entire group. She also has been going to meals.  A-Writer hasnt been assigned to her in two weeks, slightly better than when I saw her two weeks ago, less hostile. She did refuse any po meds, states only takes the needles that are OSHA approved. Tone was pressured and irritated but not as much as last time I had her. Calmed self once we were talking about another subject. Monitored for safety. R-No complaints at this time. Quiet.

## 2015-04-24 NOTE — Progress Notes (Signed)
Patient ID: [name], female   DOB: March 15, 1961, 55 y.o.   MRN: [id number] PER STATE REGULATIONS 482.30  THIS CHART WAS REVIEWED FOR MEDICAL NECESSITY WITH RESPECT TO THE PATIENT'S ADMISSION/ DURATION OF STAY.  NEXT REVIEW DATE: 04/28/2015  Willa Rough, RN, BSN CASE MANAGER

## 2015-04-24 NOTE — [facility] Group Notes (Signed)
[facility] LCSW Aftercare Discharge Planning Group Note   04/24/2015 9:58 AM  Participation Quality:  Active  Mood/Affect:  Appropriate  Depression Rating:  Pt denies  Anxiety Rating:  Pt denies   Thoughts of Suicide:  No Will you contract for safety?   NA  Current AVH:  No  Plan for Discharge/Comments:  Pt continues to deny all symptoms. Pt seemed to be in a neutral mood and when asked how she was feeling she replied, "I'm just here". Pt has been referred to the [facility] but cannot be transferred until she is agreeable to meds PO.  Transportation Means: Estate manager/land agent  Supports: Mental Health providers and family  Jonathon Jordan

## 2015-04-24 NOTE — Progress Notes (Signed)
[facility] MD Progress Note  04/24/2015  [name]  MRN:  [id number] Subjective:  Pt states "This is not going to do any good. I am not going to answer any questions.'    Objective: Pt seen and chart reviewed.I have also reviewed notes in EHR from previous days . Patient is a 10 y old AAF with hx of schizophrenia , brought in [facility] for aggressive , disorganized behavior at home. Pt per initial notes - was aggressive with family , paranoid and delusional about cable guy who came to her house .   Pt today continues to be very delusional and uncooperative and irritable . She continues to refuse to take PO medications , states that ' I will only take injections.' Pt seen as pacing around her room , when writer attempted to evaluate her.  Pt continues to lack insight in to her illness .  Per staff pt continues to refuse PO medications. Pt goes to groups , but just sits there staring on the floor. She does not participate the patient declines  Pt continues to need a lot of support.Tolerating medications well. Denies side effects.Pt declines AIMS test.   Principal Problem: Schizophrenia, paranoid (HCC) Diagnosis:   Patient Active Problem List   Diagnosis Date Noted  . Noncompliance with diet and medication regimen [Z91.11, Z91.14]   . Psychoses [F29]   . Hypokalemic alkalosis [E87.3] 11/11/2011  . Medically noncompliant [Z91.19] 11/11/2011  . Schizophrenia, paranoid (HCC) [F20.0] 11/07/2011   Total Time spent with patient: 25 minutes   Past Medical History:  Past Medical History  Diagnosis Date  . Schizophrenia   . Non compliance w medication regimen     Family History: Pt did not express any hx of mental illness in family.  Social History:  History  Alcohol Use  . Yes    Comment: Refuses to disclose how much     History  Drug Use No    Comment: Refuses to answer    Social History   Social History  . Marital Status: Single    Spouse Name: [name]  . Number of Children: [name]  . Years  of Education: [name]   Social History Main Topics  . Smoking status: Current Every Day Smoker  . Smokeless tobacco: None  . Alcohol Use: Yes     Comment: Refuses to disclose how much  . Drug Use: No     Comment: Refuses to answer  . Sexual Activity: Not Asked     Comment: refused to answer   Other Topics Concern  . None   Social History Narrative   Additional History:    Sleep: Observed as fair - but pt did not answer  Appetite:  fair as observed , but pt did not respond   Musculoskeletal: Strength & Muscle Tone: within normal limits Gait & Station: normal Patient leans: [name]   Psychiatric Specialty Exam: Physical Exam  Constitutional: She appears well-developed.  HENT:  Head: Normocephalic.  Eyes: Pupils are equal, round, and reactive to light.  Neck: Normal range of motion.  Musculoskeletal: Normal range of motion.  Neurological: She is alert.  Skin: Skin is warm and dry.    Review of Systems  Psychiatric/Behavioral: Positive for depression and hallucinations. Negative for suicidal ideas, memory loss and substance abuse. The patient is nervous/anxious. The patient does not have insomnia.   All other systems reviewed and are negative.   Blood pressure 90/60, pulse 80, temperature 98.1 F (36.7 C), temperature source Oral, resp. rate 16,  height 5\' 3"  (1.6 m), weight 63.957 kg (141 lb).Body mass index is 24.98 kg/(m^2).  General Appearance: guarded  Eye Contact:: fair  Speech: Pressured  Volume: normal  Mood: Anxious  Affect: Inappropriate , irritable  Thought Process: Tangential , Irrelevant  Orientation:is Ox3   Thought Content:seen responding to internal stimuli, delusional  Suicidal Thoughts: Denies  Homicidal Thoughts:Denies  Memory:unable to assess- seems to be intact grossly - immediate - fair , recent - fair  Judgement: Impaired  Insight: Lacking  Psychomotor Activity: normal  Concentration: Poor  Recall: Poor  Fund of  Knowledge:Poor  Language: Poor  Akathisia: No  Handed:   AIMS (if indicated):    Assets:access to health care  ADL's: Impaired  Cognition: WNL           Sleep:  Number of Hours: 6.75     Current Medications: Current Facility-Administered Medications  Medication Dose Route Frequency Provider Last Rate Last Dose  . acetaminophen (TYLENOL) tablet 650 mg  650 mg Oral Q6H PRN Charm Rings, NP      . alum & mag hydroxide-simeth (MAALOX/MYLANTA) 200-200-20 MG/5ML suspension 30 mL  30 mL Oral Q4H PRN Charm Rings, NP      . benztropine (COGENTIN) tablet 0.5 mg  0.5 mg Oral Daily Dimarco Minkin, MD       Or  . benztropine mesylate (COGENTIN) injection 0.5 mg  0.5 mg Intramuscular Daily Jomarie Longs, MD   0.5 mg at 04/24/15 0844  . carbamazepine (TEGRETOL) chewable tablet 300 mg  300 mg Oral BID PC Korine Winton, MD   300 mg at 04/16/15 1714  . diphenhydrAMINE (BENADRYL) capsule 25 mg  25 mg Oral Q8H PRN Jomarie Longs, MD       Or  . diphenhydrAMINE (BENADRYL) injection 25 mg  25 mg Intramuscular Q8H PRN Ysabela Keisler, MD      . haloperidol (HALDOL) tablet 5 mg  5 mg Oral Q8H PRN Jomarie Longs, MD       Or  . haloperidol lactate (HALDOL) injection 5 mg  5 mg Intramuscular Q8H PRN Jomarie Longs, MD      . Melene Muller ON 04/25/2015] haloperidol (HALDOL) tablet 5 mg  5 mg Oral Daily Jomarie Longs, MD       Or  . Melene Muller ON 04/25/2015] haloperidol lactate (HALDOL) injection 5 mg  5 mg Intramuscular Daily Nikeria Kalman, MD      . hydrOXYzine (ATARAX/VISTARIL) tablet 25 mg  25 mg Oral BID PC Charm Rings, NP   25 mg at 04/16/15 4098  . LORazepam (ATIVAN) tablet 1 mg  1 mg Oral Q8H PRN Jomarie Longs, MD       Or  . LORazepam (ATIVAN) injection 1 mg  1 mg Intramuscular Q8H PRN Justus Duerr, MD      . magnesium hydroxide (MILK OF MAGNESIA) suspension 30 mL  30 mL Oral Daily PRN Charm Rings, NP      . OLANZapine (ZYPREXA) tablet 15 mg  15 mg Oral QHS Jomarie Longs, MD        Or  . OLANZapine (ZYPREXA) injection 15 mg  15 mg Intramuscular QHS Pietrina Jagodzinski, MD      . traZODone (DESYREL) tablet 50 mg  50 mg Oral QHS PRN Charm Rings, NP        Lab Results:  No results found for this or any previous visit (from the past 48 hour(s)).  Physical Findings: AIMS: Facial and Oral Movements Muscles of Facial Expression: None, normal  Lips and Perioral Area: None, normal Jaw: None, normal Tongue: None, normal,Extremity Movements Upper (arms, wrists, hands, fingers): None, normal Lower (legs, knees, ankles, toes): None, normal, Trunk Movements Neck, shoulders, hips: None, normal, Overall Severity Severity of abnormal movements (highest score from questions above): None, normal Incapacitation due to abnormal movements: None, normal Patient's awareness of abnormal movements (rate only patient's report): No Awareness, Dental Status Current problems with teeth and/or dentures?: No Does patient usually wear dentures?: No  CIWA:    COWS:      Assessment: Pt is a 53 y old AAF who has a hx of schizophrenia , presented very disorganized with persecutory delusions as well as aggressive behavior.She is encouraged to take oral medications instead of injections, however she declines at this time. Currently on Forced medication order- will continue treatment.Per Collateral  information obtained from Family-per CSW - Sister is her legal guardian. Pt per sister will benefit from [facility] hospital and needs to be transferred as soon as bed available.  Patient continues to present delusional , paranoid , refusing all PO medications. Will continue medications and observe in the unit.   Treatment Plan Summary: Daily contact with patient to assess and evaluate symptoms and progress in treatment and Medication management  Medications:  -Reduce Haldol to  po/ 5 mg Im daily for psychosis , Cogentin 0.5 mg po/IM for EPS . Pt is on Forced medication order. -Will increase Zyprexa to 15  mg po /im at bedtime to augment the effect of Haldol.  -Continue Tegretol 300 mg po bid for psychosis. But she has been refusing all PO medications. -Continue Haldol  PO or IM q8h prn for agitation. -Continue Benadryl 25 mg PO/IM (EPS), Ativan 1 mg po/IM q8h prn for agitation . -Continue Trazodone  qhs prn insomnia -Reviewed EKG- wnl , TSH - wnl , lipid panel- wnl , Hba1c- 6.1 - dietician consult when pt is more stable  , PL- wnl. CSW will work on [place] referral for placement.CSW to obtain medical records from [place] hospital from patient's last hospitalization.  Medical Decision Making:  Review of Psycho-Social Stressors (1), Review of Last Therapy Session (1), Review of Medication Regimen & Side Effects (2) and Review of New Medication or Change in Dosage (2)   Onya Eutsler, MD 04/24/2015,10:02 AM

## 2015-04-24 NOTE — Progress Notes (Signed)
D   Pt has been in her room most of the shift    She took her medication in shot form without a problem   She has limited interaction but was polite while staff was administering her medication   She is guarded and paranoid and appears to be responding to intrusive thoughts  A   Verbal support given   Medications administered and effectiveness monitored   Q 15 min checks R   Pt is safe at present and remains in her room 

## 2015-04-25 NOTE — [facility] Group Notes (Signed)
[facility] LCSW Group Therapy  04/25/2015 1:15 pm  Type of Therapy: Process Group Therapy  Participation Level:  Active  Participation Quality:  Appropriate  Affect:  Flat  Cognitive:  Oriented  Insight:  Improving  Engagement in Group:  Limited  Engagement in Therapy:  Limited  Modes of Intervention:  Activity, Clarification, Education, Problem-solving and Support  Summary of Progress/Problems: Today's group addressed the issue of overcoming obstacles.  Patients were asked to identify their biggest obstacle post d/c that stands in the way of their on-going success, and then problem solve as to how to manage this.  "There is always a family member that is trying to pry into my business.  They pull me down.  I don't have any goals.  I'm just taking it one day at a time."  "If I live with family, that is just the way it is."  Described her community and how there is conflict on an on-going basis.  Then got into a convoluted description of how this is really the year 2020, but "they" are convincing everyone it is 4 years earlier-2016.  This relates to her being controlled by the government. "People have a bad habit of limiting others."  Unraveled more the longer she talked.  [name] B 04/25/2015   1:39 PM

## 2015-04-25 NOTE — Progress Notes (Addendum)
[facility] MD Progress Note  04/25/2015  [name]  MRN:  [id number] Subjective:  Pt states "I am not answering questions.'    Objective: Pt seen and chart reviewed.I have also reviewed notes in EHR from previous days . Patient is a 74 y old AAF with hx of schizophrenia , brought in [name] for aggressive , disorganized behavior at home. Pt per initial notes - was aggressive with family , paranoid and delusional about cable guy who came to her house .   Pt today continues to be very delusional and uncooperative and irritable .  She continues to be adamant that she will take only IM medications. Per nursing pt has swelling at the injection site - but pt reports she is not in pain and does not want writer to examine her. Pt offered ice pack as well as pain medications. She declined both. Pt today denies SI/HI and reports that she is not here for her mental health.    Principal Problem: Schizophrenia, paranoid (HCC) Diagnosis:   Patient Active Problem List   Diagnosis Date Noted  . Noncompliance with diet and medication regimen [Z91.11, Z91.14]   . Psychoses [F29]   . Hypokalemic alkalosis [E87.3] 11/11/2011  . Medically noncompliant [Z91.19] 11/11/2011  . Schizophrenia, paranoid (HCC) [F20.0] 11/07/2011   Total Time spent with patient: 25 minutes   Past Medical History:  Past Medical History  Diagnosis Date  . Schizophrenia   . Non compliance w medication regimen     Family History: Pt did not express any hx of mental illness in family.  Social History:  History  Alcohol Use  . Yes    Comment: Refuses to disclose how much     History  Drug Use No    Comment: Refuses to answer    Social History   Social History  . Marital Status: Single    Spouse Name: [name]  . Number of Children: [name]  . Years of Education: [name]   Social History Main Topics  . Smoking status: Current Every Day Smoker  . Smokeless tobacco: None  . Alcohol Use: Yes     Comment: Refuses to disclose how  much  . Drug Use: No     Comment: Refuses to answer  . Sexual Activity: Not Asked     Comment: refused to answer   Other Topics Concern  . None   Social History Narrative   Additional History:    Sleep: Observed as fair - but pt did not answer  Appetite:  fair as observed , but pt did not respond   Musculoskeletal: Strength & Muscle Tone: within normal limits Gait & Station: normal Patient leans: [name]   Psychiatric Specialty Exam: Physical Exam  Constitutional: She appears well-developed.  HENT:  Head: Normocephalic.  Eyes: Pupils are equal, round, and reactive to light.  Neck: Normal range of motion.  Musculoskeletal: Normal range of motion.  Neurological: She is alert.  Skin: Skin is warm and dry.    Review of Systems  Psychiatric/Behavioral: Positive for depression and hallucinations. Negative for suicidal ideas, memory loss and substance abuse. The patient is nervous/anxious. The patient does not have insomnia.   All other systems reviewed and are negative.   Blood pressure 93/58, pulse 69, temperature 98.1 F (36.7 C), temperature source Oral, resp. rate 18, height  (1.6 m), weight 63.957 kg (141 lb).Body mass index is 24.98 kg/(m^2).  General Appearance: guarded  Eye Contact:: fair  Speech: Pressured  Volume: normal  Mood: Anxious  Affect: Inappropriate , irritable  Thought Process: Tangential , Irrelevant  Orientation:is Ox3   Thought Content:seen responding to internal stimuli, delusional  Suicidal Thoughts: Denies  Homicidal Thoughts:Denies  Memory:unable to assess- seems to be intact grossly - immediate - fair , recent - fair  Judgement: Impaired  Insight: Lacking  Psychomotor Activity: normal  Concentration: Poor  Recall: Poor  Fund of Knowledge:Poor  Language: Poor  Akathisia: No  Handed:   AIMS (if indicated):    Assets:access to health care  ADL's: Impaired  Cognition: WNL           Sleep:   Number of Hours: 6.75     Current Medications: Current Facility-Administered Medications  Medication Dose Route Frequency Provider Last Rate Last Dose  . acetaminophen (TYLENOL) tablet 650 mg  650 mg Oral Q6H PRN Charm Rings, NP      . alum & mag hydroxide-simeth (MAALOX/MYLANTA) 200-200-20 MG/5ML suspension 30 mL  30 mL Oral Q4H PRN Charm Rings, NP      . benztropine (COGENTIN) tablet 0.5 mg  0.5 mg Oral Daily Tanylah Schnoebelen, MD       Or  . benztropine mesylate (COGENTIN) injection 0.5 mg  0.5 mg Intramuscular Daily Jomarie Longs, MD   0.5 mg at 04/25/15 0836  . carbamazepine (TEGRETOL) chewable tablet 300 mg  300 mg Oral BID PC Tylia Ewell, MD   300 mg at 04/16/15 1714  . diphenhydrAMINE (BENADRYL) capsule 25 mg  25 mg Oral Q8H PRN Jomarie Longs, MD       Or  . diphenhydrAMINE (BENADRYL) injection 25 mg  25 mg Intramuscular Q8H PRN Demarus Latterell, MD      . haloperidol (HALDOL) tablet 5 mg  5 mg Oral Q8H PRN Jomarie Longs, MD       Or  . haloperidol lactate (HALDOL) injection 5 mg  5 mg Intramuscular Q8H PRN Joana Nolton, MD      . haloperidol (HALDOL) tablet 5 mg  5 mg Oral Daily Kileen Lange, MD       Or  . haloperidol lactate (HALDOL) injection 5 mg  5 mg Intramuscular Daily Audryana Hockenberry, MD   5 mg at 04/25/15 0836  . hydrOXYzine (ATARAX/VISTARIL) tablet 25 mg  25 mg Oral BID PC Charm Rings, NP   25 mg at 04/16/15 1191  . LORazepam (ATIVAN) tablet 1 mg  1 mg Oral Q8H PRN Jomarie Longs, MD       Or  . LORazepam (ATIVAN) injection 1 mg  1 mg Intramuscular Q8H PRN Haiven Nardone, MD      . magnesium hydroxide (MILK OF MAGNESIA) suspension 30 mL  30 mL Oral Daily PRN Charm Rings, NP      . OLANZapine (ZYPREXA) tablet 15 mg  15 mg Oral QHS Jomarie Longs, MD       Or  . OLANZapine (ZYPREXA) injection 15 mg  15 mg Intramuscular QHS Jomarie Longs, MD   15 mg at 04/24/15 2209  . traZODone (DESYREL) tablet 50 mg  50 mg Oral QHS PRN Charm Rings, NP        Lab  Results:  No results found for this or any previous visit (from the past 48 hour(s)).  Physical Findings: AIMS: Facial and Oral Movements Muscles of Facial Expression: None, normal Lips and Perioral Area: None, normal Jaw: None, normal Tongue: None, normal,Extremity Movements Upper (arms, wrists, hands, fingers): None, normal Lower (legs, knees, ankles, toes): None, normal, Trunk Movements Neck, shoulders, hips: None,  normal, Overall Severity Severity of abnormal movements (highest score from questions above): None, normal Incapacitation due to abnormal movements: None, normal Patient's awareness of abnormal movements (rate only patient's report): No Awareness, Dental Status Current problems with teeth and/or dentures?: No Does patient usually wear dentures?: No  CIWA:    COWS:      Assessment: Pt is a 63 y old AAF who has a hx of schizophrenia , presented very disorganized with persecutory delusions as well as aggressive behavior.She is encouraged to take oral medications instead of injections, however she declines at this time. Currently on Forced medication order- will continue treatment.Per Collateral  information obtained from Family-per CSW - Sister is her legal guardian. Pt per sister will benefit from [facility] hospital and needs to be transferred as soon as bed available.  Patient continues to present delusional , paranoid , refusing all PO medications. Will continue medications and observe in the unit.   Treatment Plan Summary: Daily contact with patient to assess and evaluate symptoms and progress in treatment and Medication management  Medications:  -DC Haldol , Continue Cogentin 0.5 mg po/IM for EPS . Pt is on Forced medication order. -Will continue  Zyprexa  15 mg po /im at bedtime to augment the effect of Haldol. Plan to offer Invega sustenna IM . However will need collateral information from sister. -Continue Tegretol 300 mg po bid for psychosis. But she has been refusing all  PO medications. -Continue Haldol  PO or IM q8h prn for agitation. -Continue Benadryl 25 mg PO/IM (EPS), Ativan 1 mg po/IM q8h prn for agitation . -Continue Trazodone  qhs prn insomnia -Reviewed EKG- wnl , TSH - wnl , lipid panel- wnl , Hba1c- 6.1 - dietician consult when pt is more stable  , PL- wnl. CSW will work on [place] referral for placement. Reviewed Medical records from [place] - Pt was on Invega sustenna IM 234 mg in the past.  Medical Decision Making:  Review of Psycho-Social Stressors (1), Review of Last Therapy Session (1), Review of Medication Regimen & Side Effects (2) and Review of New Medication or Change in Dosage (2)    I certify that the services received since the previous certification/recertification were and continue to be medically necessary as the treatment provided can be reasonably expected to improve the patient's condition; the medical record documents that the services furnished were intensive treatment services or their equivalent services, and this patient continues to need, on a daily basis, active treatment furnished directly by or requiring the supervision of inpatient psychiatric personnel.    Fleda Pagel, MD 04/25/2015,2:16 PM

## 2015-04-25 NOTE — Progress Notes (Signed)
DAR Note: Patient still presents with angry affect and depressed mood.  Denies visual and auditory hallucinations.  Refused oral medication and states "it doesn't work for me."  Patient complain of pain on left deltoid.  Patient educated on injection sites rotation.  Patient refused other sites for IM injection.  MD Eappen made aware.  Patient maintained on routine safety checks per protocol.  Support and encouragement offered as needed.

## 2015-04-25 NOTE — Progress Notes (Signed)
Psychoeducational Group Note  Date:  04/25/2015 Time:  2321  Group Topic/Focus:  Wrap-Up Group:   The focus of this group is to help patients review their daily goal of treatment and discuss progress on daily workbooks.  Participation Level: Did Not Attend  Participation Quality:  Not Applicable  Affect:  Not Applicable  Cognitive:  Not Applicable  Insight:  Not Applicable  Engagement in Group: Not Applicable  Additional Comments:  The patient did not attend Karaoke group this evening and elected to sleep.   Maelle Sheaffer S 04/25/2015, 11:21 PM

## 2015-04-25 NOTE — [facility] Group Notes (Signed)
[facility] Group Notes:  (Nursing/MHT/Case Management/Adjunct)  Date:  04/25/2015  Time:  0930 Type of Therapy:  Nurse Education  Participation Level:  Minimal  Participation Quality:  Attentive  Affect:  Angry, Depressed and Irritable  Cognitive:  Alert  Insight:  Lacking  Engagement in Group:  None  Modes of Intervention:  Discussion, Education and Exploration  Summary of Progress/Problems: Group topic was on leisure and lifestyle changes. Discussed fun and appropriate activities.  Patient showed no interest and would not respond even when encouraged. [name] 04/25/2015, 1:32 PM

## 2015-04-26 MED ORDER — PALIPERIDONE PALMITATE 156 MG/ML IM SUSP
156.0000 mg | Freq: Once | INTRAMUSCULAR | Status: DC
Start: 2015-05-06 — End: 2015-04-26

## 2015-04-26 MED ORDER — OLANZAPINE 5 MG PO TABS: 5.0000 mg | ORAL_TABLET | Freq: Every day | ORAL | Status: DC

## 2015-04-26 MED ORDER — OLANZAPINE 7.5 MG PO TABS
15.0000 mg | ORAL_TABLET | Freq: Every day | ORAL | Status: AC
  Filled 2015-04-26 (×3): qty 2

## 2015-04-26 MED ORDER — PALIPERIDONE PALMITATE 234 MG/1.5ML IM SUSP: 234.0000 mg | Freq: Once | INTRAMUSCULAR | Status: DC

## 2015-04-26 MED ORDER — OLANZAPINE 10 MG IM SOLR
15.0000 mg | Freq: Every day | INTRAMUSCULAR | Status: AC
  Administered 2015-04-26 – 2015-04-28 (×2): 15 mg via INTRAMUSCULAR
  Filled 2015-04-26 (×3): qty 20

## 2015-04-26 MED ORDER — OLANZAPINE 10 MG IM SOLR: 5.0000 mg | Freq: Every day | INTRAMUSCULAR | Status: DC

## 2015-04-26 NOTE — [facility] Group Notes (Signed)
[facility] LCSW Aftercare Discharge Planning Group Note   04/26/2015 11:16 AM  Participation Quality:  Active  Mood/Affect:  Appropriate  Depression Rating:  Pt denies    Anxiety Rating:  Pt denies   Thoughts of Suicide:  No Will you contract for safety?   NA  Current AVH:  No  Plan for Discharge/Comments:  Pt denies all symptoms. Pt stated "I'm feeling ok, I have nothing to say". Pt reported that she hasn't had any visitors and doesn't wish to have any visitors. Pt presented as irritable and lacks insight about her condition.   Transportation Means: Family will transport   Supports: Family and mental health providers   Jonathon Jordan

## 2015-04-26 NOTE — Plan of Care (Signed)
Problem: Alteration in thought process Goal: STG-Patient is able to discuss thoughts with staff Outcome: Not Progressing Patient suspicious of staff, and unwilling to verbally communicate present thoughts or feelings other than distrust and desire to leave.

## 2015-04-26 NOTE — Progress Notes (Signed)
[facility] MD Progress Note  04/26/2015  [name]  MRN:  [id number] Subjective:  Pt states "I have nothing to say."    Objective: Pt seen and chart reviewed.I have also reviewed notes in EHR from previous days . Patient is a 59 y old AAF with hx of schizophrenia , brought in [facility] for aggressive , disorganized behavior at home. Pt per initial notes - was aggressive with family , paranoid and delusional about cable guy who came to her house .   Pt today continues to be delusional  uncooperative and irritable . Pt continues to refuse PO medications, lacks insight in to her illness. Pt per staff - seen as irritable , delusional , yet participates in group activities. Pt lacks insight in to her illness - feels she is here due to the government. Pt continues to require support and encouragement.  Collateral information was obtained from sister [name] at [phone] - per sister - pt was tried on Invega sustenna in the past - but she felt like her sister was over medicated and could not function. She wants to sent her to an ALF - can only do so if she is able to function and be aware of her surroundings. [facility] . [facility] , and in the mean time is OK with [place] providing the invega sustenna IM LAI - if she tolerates it well , but at a lower dose.      Principal Problem: Schizophrenia, paranoid (HCC) Diagnosis:   Patient Active Problem List   Diagnosis Date Noted  . Noncompliance with diet and medication regimen [Z91.11, Z91.14]   . Psychoses [F29]   . Hypokalemic alkalosis [E87.3] 11/11/2011  . Medically noncompliant [Z91.19] 11/11/2011  . Schizophrenia, paranoid (HCC) [F20.0] 11/07/2011   Total Time spent with patient: 25 minutes   Past Medical History:  Past Medical History  Diagnosis Date  . Schizophrenia   . Non compliance w medication regimen     Family History: Pt did not express any hx of mental illness in  family.  Social History:  History  Alcohol Use  . Yes    Comment: Refuses to disclose how much     History  Drug Use No    Comment: Refuses to answer    Social History   Social History  . Marital Status: Single    Spouse Name: [name]  . Number of Children: [name]  . Years of Education: [name]   Social History Main Topics  . Smoking status: Current Every Day Smoker  . Smokeless tobacco: None  . Alcohol Use: Yes     Comment: Refuses to disclose how much  . Drug Use: No     Comment: Refuses to answer  . Sexual Activity: Not Asked     Comment: refused to answer   Other Topics Concern  . None   Social History Narrative   Additional History:    Sleep: Fair  Appetite:  Fair   Musculoskeletal: Strength & Muscle Tone: within normal limits Gait & Station: normal Patient leans: [name]   Psychiatric Specialty Exam: Physical Exam  Constitutional: She appears well-developed.  HENT:  Head: Normocephalic.  Eyes: Pupils are equal, round, and reactive to light.  Neck: Normal range of motion.  Musculoskeletal: Normal range of motion.  Neurological: She is alert.  Skin: Skin is warm and dry.    Review of Systems  Unable to perform ROS: mental acuity  Blood pressure 97/68, pulse 92, temperature 98.4 F (36.9 C), temperature source Oral, resp. rate 16, height 5\' 3"  (1.6 m), weight 63.957 kg (141 lb).Body mass index is 24.98 kg/(m^2).  General Appearance: guarded  Eye Contact:: fair  Speech: Pressured, improving   Volume: normal  Mood: Anxious  Affect: Inappropriate , irritable  Thought Process: Tangential , but is more linear than on admission  Orientation:states she is in the hospital , knows me as her doctor  Thought Content:seen responding to internal stimuli, delusional  Suicidal Thoughts: Did not express any  Homicidal Thoughts:Did not express any   Memory:unable to assess- seems to be intact grossly - immediate - fair , recent - fair  Judgement: Impaired   Insight: Lacking  Psychomotor Activity: normal  Concentration: Poor  Recall: Poor  Fund of Knowledge:Poor  Language: Poor  Akathisia: No  Handed:   AIMS (if indicated):    Assets:access to health care  ADL's: Impaired  Cognition: WNL           Sleep:  Number of Hours: 5.75     Current Medications: Current Facility-Administered Medications  Medication Dose Route Frequency Provider Last Rate Last Dose  . acetaminophen (TYLENOL) tablet 650 mg  650 mg Oral Q6H PRN Charm Rings, NP      . alum & mag hydroxide-simeth (MAALOX/MYLANTA) 200-200-20 MG/5ML suspension 30 mL  30 mL Oral Q4H PRN Charm Rings, NP      . carbamazepine (TEGRETOL) chewable tablet 300 mg  300 mg Oral BID PC Rameses Ou, MD   300 mg at 04/16/15 1714  . diphenhydrAMINE (BENADRYL) capsule 25 mg  25 mg Oral Q8H PRN Jomarie Longs, MD       Or  . diphenhydrAMINE (BENADRYL) injection 25 mg  25 mg Intramuscular Q8H PRN Tejuan Gholson, MD      . haloperidol (HALDOL) tablet 5 mg  5 mg Oral Q8H PRN Jomarie Longs, MD       Or  . haloperidol lactate (HALDOL) injection 5 mg  5 mg Intramuscular Q8H PRN Brelee Renk, MD      . hydrOXYzine (ATARAX/VISTARIL) tablet 25 mg  25 mg Oral BID PC Charm Rings, NP   25 mg at 04/16/15 1610  . LORazepam (ATIVAN) tablet 1 mg  1 mg Oral Q8H PRN Jomarie Longs, MD       Or  . LORazepam (ATIVAN) injection 1 mg  1 mg Intramuscular Q8H PRN Nagee Goates, MD      . magnesium hydroxide (MILK OF MAGNESIA) suspension 30 mL  30 mL Oral Daily PRN Charm Rings, NP      . OLANZapine (ZYPREXA) tablet 15 mg  15 mg Oral QHS Jomarie Longs, MD       Or  . OLANZapine (ZYPREXA) injection 15 mg  15 mg Intramuscular QHS Jomarie Longs, MD      . Melene Muller ON 05/06/2015] paliperidone (INVEGA SUSTENNA) injection 156 mg  156 mg Intramuscular Once Jomarie Longs, MD      . Melene Muller ON 04/29/2015] paliperidone (INVEGA SUSTENNA) injection 234 mg  234 mg Intramuscular Once Jomarie Longs, MD      . traZODone (DESYREL) tablet 50 mg  50 mg Oral QHS PRN Charm Rings, NP        Lab Results:  No results found for this or any previous visit (from the past 48 hour(s)).  Physical Findings: AIMS: Facial and Oral Movements Muscles of Facial Expression: None, normal Lips and Perioral Area: None, normal Jaw:  None, normal Tongue: None, normal,Extremity Movements Upper (arms, wrists, hands, fingers): None, normal Lower (legs, knees, ankles, toes): None, normal, Trunk Movements Neck, shoulders, hips: None, normal, Overall Severity Severity of abnormal movements (highest score from questions above): None, normal Incapacitation due to abnormal movements: None, normal Patient's awareness of abnormal movements (rate only patient's report): No Awareness, Dental Status Current problems with teeth and/or dentures?: No Does patient usually wear dentures?: No  CIWA:    COWS:      Assessment: Pt is a 23 y old AAF who has a hx of schizophrenia , presented very disorganized with persecutory delusions as well as aggressive behavior.She is encouraged to take oral medications instead of injections, however she declines at this time.  Currently on Forced medication order- will continue treatment. Sister is her legal guardian. Pt per sister with benefit from [facility] and needs to be transferred as soon as bed available. However [facility] -she can only be taken if she takes po medications and is not on forced medications. Patient continues to present delusional , paranoid , lacks insight . However she is not as aggressive or irritable as she were on admission.  Reviewed records from [place] - INPATIENT DATES - 04/18/13- 05/08/13.Pt at that time was DC ed on Invega sustenna 234 mg IM q28 days .    Treatment Plan Summary: Daily contact with patient to assess and evaluate symptoms and progress in treatment and Medication management  Medications:  - Pt is on Forced medication order. -Will  continue  Zyprexa  15 mg po /im at bedtime to augment the effect of Haldol. Plan to offer Invega sustenna IM .Collateral information obtained from sister - see above. . -Continue Tegretol 300 mg po bid for psychosis. But she has been refusing all PO medications. -Continue Haldol  PO or IM q8h prn for agitation. -Continue Benadryl 25 mg PO/IM (EPS), Ativan 1 mg po/IM q8h prn for agitation . -Continue Trazodone  qhs prn insomnia -Reviewed EKG- wnl , TSH - wnl , lipid panel- wnl , Hba1c- 6.1 - dietician consult when pt is more stable  , PL- wnl. Repeat EKG - since she is receiving IM zyprexa.. CSW will work on [place] referral for placement.  Medical Decision Making:  Review of Psycho-Social Stressors (1), Review of Last Therapy Session (1), Review of Medication Regimen & Side Effects (2) and Review of New Medication or Change in Dosage (2)    I certify that the services received since the previous certification/recertification were and continue to be medically necessary as the treatment provided can be reasonably expected to improve the patient's condition; the medical record documents that the services furnished were intensive treatment services or their equivalent services, and this patient continues to need, on a daily basis, active treatment furnished directly by or requiring the supervision of inpatient psychiatric personnel.    Zohra Clavel, MD 04/26/2015,2:57 PM

## 2015-04-26 NOTE — Progress Notes (Signed)
D: Pt is somewhat confused paranoid and delusional especially about her medications. She states, "Those medicine you take through the mouth are never correct; they are like poison." Pt however completely denies any form of depression, anxiety, pain, SI, HI and AVH. Pt is very animated, making minimal eye contact. Pt is however continue to be nonaggressive.  A: Medications offered as prescribed.  Support, encouragement, and safe environment provided.  15-minute safety checks continue.  R: Pt was med compliant (IM).  Did not attend group. Safety checks continue.

## 2015-04-26 NOTE — [facility] Group Notes (Signed)
Pt said she was bored and wants to get out of here.  Writer encouraged patient to talk to doctor but patient stated she "don't get along with the doctor".   Caroll Rancher, MHT

## 2015-04-26 NOTE — [facility] Group Notes (Signed)
[facility] LCSW Group Therapy   04/26/2015 2:03 PM  Type of Therapy: Group Therapy  Participation Level:  Active  Participation Quality:  Attentive  Affect:  Flat  Cognitive:  Oriented  Insight:  Limited  Engagement in Therapy:  Engaged  Modes of Intervention:  Discussion and Socialization  Summary of Progress/Problems: Chaplain was here to lead a group on themes of hope and/or courage.  Pt was alert and pleasant throughout the group. Did not participate much in the group but remained attentive. When asked if pt could relate to anything that other members have said during group pt responded "No not really. Everyone's situation is different".  Jonathon Jordan 04/26/2015 2:03 PM

## 2015-04-26 NOTE — Progress Notes (Signed)
D: Patient alert and oriented x3. Patient very irritable and suspicious of Clinical research associate and hospital staff. Patient unwilling to communicate feelings and concerns. Patient stated "I'm not here for my mental hygiene...this isn't a legit institution." Patient indicated she was waiting for a public defender to arrive, and denies mental health illness. Patient would not cooperate with verbal assessment, but per self inventory: no physical pain, no SI/HI/AVH, and rated depression, hopelessness and anxiety at 0. Patient denied all PO medications.  Patient isolative to room, seen mostly standing and pacing. Patient not seen communicating with fellow patients  A: Provided active listening and support. Offered scheduled medications. Attempted to strengthen trust in staff. Discussed patients status with MD and treatment team.   R: Patient remained irritable and suspicious of staff. Pt continued to deny PO meds, and would get loud and argumentative when questioned on the matter. Patient did attend groups.

## 2015-04-27 MED ORDER — OLANZAPINE 10 MG IM SOLR
5.0000 mg | Freq: Every day | INTRAMUSCULAR | Status: DC
  Administered 2015-04-27 – 2015-04-29 (×3): 5 mg via INTRAMUSCULAR
  Filled 2015-04-27 (×5): qty 10

## 2015-04-27 MED ORDER — OLANZAPINE 5 MG PO TABS
5.0000 mg | ORAL_TABLET | Freq: Every day | ORAL | Status: DC
  Filled 2015-04-27 (×5): qty 1

## 2015-04-27 NOTE — Progress Notes (Signed)
D: Pt continues to be confused paranoid and delusional especially about her medications. She continues to refused PO medication, however prefers IM. Pt however completely denies any form of depression, anxiety, pain, SI, HI and AVH; Wong-Baker Faces Pain Rating Scale indicates severe pain from injection sites. Pt is very animated, making minimal eye contact. Pt is however continues to be nonaggressive.  A: Medications offered as prescribed.  Support, encouragement, and safe environment provided.  15-minute safety checks continue.  R: Pt was med compliant (IM).  Pt did not attend group. Safety checks continue.

## 2015-04-27 NOTE — Progress Notes (Signed)
[facility] MD Progress Note  04/27/2015  [name]  MRN:  [id number] Subjective:  Pt states "I am not taking medications.'    Objective: Pt seen and chart reviewed.I have also reviewed notes in EHR from previous days . Patient is a 75 y old AAF with hx of schizophrenia , brought in [facility] for aggressive , disorganized behavior at home. Pt per initial notes - was aggressive with family , paranoid and delusional about cable guy who came to her house .   Pt today continues to be delusional  uncooperative and irritable . Pt seen as being loud and getting agitated , when writer attempted to discuss the need for medication compliance. Pt continues to refuse PO medications, lacks insight in to her illness. Pt per staff - seen as irritable , delusional , yet participates in group activities.In groups pt seen as expressing thoughts with delusional content.  Pt continues to require support and encouragement.     Principal Problem: Schizophrenia, paranoid (HCC) Diagnosis:   Patient Active Problem List   Diagnosis Date Noted  . Noncompliance with diet and medication regimen [Z91.11, Z91.14]   . Psychoses [F29]   . Hypokalemic alkalosis [E87.3] 11/11/2011  . Medically noncompliant [Z91.19] 11/11/2011  . Schizophrenia, paranoid (HCC) [F20.0] 11/07/2011   Total Time spent with patient: 25 minutes   Past Medical History:  Past Medical History  Diagnosis Date  . Schizophrenia   . Non compliance w medication regimen     Family History: Pt did not express any hx of mental illness in family.  Social History:  History  Alcohol Use  . Yes    Comment: Refuses to disclose how much     History  Drug Use No    Comment: Refuses to answer    Social History   Social History  . Marital Status: Single    Spouse Name: [name]  . Number of Children: [name]  . Years of Education: [name]   Social History Main Topics  . Smoking status: Current Every Day Smoker  . Smokeless tobacco: None  . Alcohol Use: Yes     Comment: Refuses to disclose how much  . Drug Use: No     Comment: Refuses to answer  . Sexual Activity: Not Asked     Comment: refused to answer   Other Topics Concern  . None   Social History Narrative   Additional History:    Sleep: Fair  Appetite:  Fair   Musculoskeletal: Strength & Muscle Tone: within normal limits Gait & Station: normal Patient leans: [name]   Psychiatric Specialty Exam: Physical Exam  Constitutional: She appears well-developed.  HENT:  Head: Normocephalic.  Eyes: Pupils are equal, round, and reactive to light.  Neck: Normal range of motion.  Musculoskeletal: Normal range of motion.  Neurological: She is alert.  Skin: Skin is warm and dry.    Review of Systems  Unable to perform ROS: mental acuity    Blood pressure 96/64, pulse 72, temperature 97.6 F (36.4 C), temperature source Oral, resp. rate 16, height  (1.6 m), weight 63.957 kg (141 lb).Body mass index is 24.98 kg/(m^2).  General Appearance: guarded  Eye Contact:: fair  Speech: loud , pressured   Volume: loud at times  Mood: Anxious  Affect: Inappropriate , irritable  Thought Process:linear  Orientation:states she is in the hospital , knows me as her doctor  Thought Content:seen responding to internal stimuli, delusional  Suicidal Thoughts: Did not express any  Homicidal Thoughts:Did not express any  Memory:unable to assess- seems to be intact grossly - immediate - fair , recent - fair  Judgement: Impaired  Insight: Lacking  Psychomotor Activity: normal  Concentration: Poor  Recall: Poor  Fund of Knowledge:Poor  Language: Poor  Akathisia: No  Handed:   AIMS (if indicated):    Assets:access to health care  ADL's: Impaired  Cognition: WNL           Sleep:  Number of Hours: 6.75     Current Medications: Current Facility-Administered Medications  Medication Dose Route Frequency Provider Last Rate Last Dose  . acetaminophen  (TYLENOL) tablet 650 mg  650 mg Oral Q6H PRN Charm Rings, NP      . alum & mag hydroxide-simeth (MAALOX/MYLANTA) 200-200-20 MG/5ML suspension 30 mL  30 mL Oral Q4H PRN Charm Rings, NP      . carbamazepine (TEGRETOL) chewable tablet 300 mg  300 mg Oral BID PC Christos Mixson, MD   300 mg at 04/16/15 1714  . diphenhydrAMINE (BENADRYL) capsule 25 mg  25 mg Oral Q8H PRN Jomarie Longs, MD       Or  . diphenhydrAMINE (BENADRYL) injection 25 mg  25 mg Intramuscular Q8H PRN Oluwafemi Villella, MD      . haloperidol (HALDOL) tablet 5 mg  5 mg Oral Q8H PRN Jomarie Longs, MD       Or  . haloperidol lactate (HALDOL) injection 5 mg  5 mg Intramuscular Q8H PRN Ala Kratz, MD      . hydrOXYzine (ATARAX/VISTARIL) tablet 25 mg  25 mg Oral BID PC Charm Rings, NP   25 mg at 04/16/15 0865  . LORazepam (ATIVAN) tablet 1 mg  1 mg Oral Q8H PRN Jomarie Longs, MD       Or  . LORazepam (ATIVAN) injection 1 mg  1 mg Intramuscular Q8H PRN Mateusz Neilan, MD      . magnesium hydroxide (MILK OF MAGNESIA) suspension 30 mL  30 mL Oral Daily PRN Charm Rings, NP      . OLANZapine (ZYPREXA) tablet 15 mg  15 mg Oral QHS Jomarie Longs, MD       Or  . OLANZapine (ZYPREXA) injection 15 mg  15 mg Intramuscular QHS Jomarie Longs, MD   15 mg at 04/26/15 2154  . OLANZapine (ZYPREXA) tablet 5 mg  5 mg Oral Daily Jomarie Longs, MD       Or  . OLANZapine (ZYPREXA) injection 5 mg  5 mg Intramuscular Daily Iver Fehrenbach, MD      . traZODone (DESYREL) tablet 50 mg  50 mg Oral QHS PRN Charm Rings, NP        Lab Results:  No results found for this or any previous visit (from the past 48 hour(s)).  Physical Findings: AIMS: Facial and Oral Movements Muscles of Facial Expression: None, normal Lips and Perioral Area: None, normal Jaw: None, normal Tongue: None, normal,Extremity Movements Upper (arms, wrists, hands, fingers): None, normal Lower (legs, knees, ankles, toes): None, normal, Trunk Movements Neck,  shoulders, hips: None, normal, Overall Severity Severity of abnormal movements (highest score from questions above): None, normal Incapacitation due to abnormal movements: None, normal Patient's awareness of abnormal movements (rate only patient's report): No Awareness, Dental Status Current problems with teeth and/or dentures?: No Does patient usually wear dentures?: No  CIWA:    COWS:      Assessment: Pt is a 86 y old AAF who has a hx of schizophrenia , presented very disorganized with persecutory delusions  as well as aggressive behavior.She is encouraged to take oral medications instead of injections, however she declines at this time.  Currently on Forced medication order- will continue treatment. Sister is her legal guardian. Pt per sister with benefit from [facility] hospital and needs to be transferred as soon as [facility] available. However [facility] -she can only be [facility] if she takes po medications and is not on forced medications. Patient continues to present delusional , paranoid , lacks insight .  Reviewed records from [place] - INPATIENT DATES - 04/18/13- 05/08/13.Pt at that time was DC ed on Invega sustenna 234 mg IM q28 days .    Treatment Plan Summary: Daily contact with patient to assess and evaluate symptoms and progress in treatment and Medication management  Medications:  - Pt is on Forced medication order. -Will increase Zyprexa to 20 mg po /im at bedtime for psychosis . Discussed with sister who is the legal guardian about possibley offering patient an LAI - Invega sustenna IM . However - pt may not be able to afford it at this time . -Continue Tegretol 300 mg po bid for psychosis. But she has been refusing all PO medications. -Continue Haldol 5mg  PO or IM q8h prn for agitation. -Continue Benadryl 25 mg PO/IM (EPS), Ativan 1 mg po/IM q8h prn for agitation . -Continue Trazodone 50mg  qhs prn insomnia -Reviewed EKG- wnl , TSH - wnl , lipid panel- wnl , Hba1c- 6.1 - dietician consult when  pt is more stable  , PL- wnl. Repeat EKG - since she is receiving IM zyprexa.. CSW will work on [place] referral for placement.  Medical Decision Making:  Review of Psycho-Social Stressors (1), Review of Last Therapy Session (1), Review of Medication Regimen & Side Effects (2) and Review of New Medication or Change in Dosage (2)     Shakeyla Giebler, MD 04/27/2015,12:59 PM

## 2015-04-27 NOTE — Progress Notes (Signed)
[name] is paranoid, agitated and does not want to talk and / or interact with anybody. She refused her po meds at 17oo and this was documented.   A She completed her daily assessment and on it she wrote she deneid SI and she rated her depression, hopelessness and anxiety " 0/0/0", respectively.   R Safety in place. Cont to encourage pt's compliance.

## 2015-04-27 NOTE — Progress Notes (Signed)
D: Patient denies SI/HI and A/V hallucinations; patient reports; patient appears to be responding to internal stimuli  A: Monitored q 15 minutes; patient encouraged to attend groups; patient educated about medications; patient given medications per physician orders; patient encouraged to express feelings and/or concerns  R: Patient stays in her room in the dark when not attending groups. Patient is calm and cooperative excepted when offered po medications. Patient is isolative and guarded

## 2015-04-27 NOTE — Progress Notes (Signed)
Adult Psychoeducational Group Note  Date:  04/27/2015 Time:  8:44 PM  Group Topic/Focus:  Wrap-Up Group:   The focus of this group is to help patients review their daily goal of treatment and discuss progress on daily workbooks.  Participation Level:  Active  Participation Quality:  Appropriate  Affect:  Appropriate  Cognitive:  Appropriate  Insight: Appropriate  Engagement in Group:  Engaged  Modes of Intervention:  Discussion  Additional Comments:The patient expressed that she attended group.The patient also said that she talk to her Child psychotherapist.  Octavio Manns 04/27/2015, 8:44 PM

## 2015-04-27 NOTE — [facility] Group Notes (Signed)
[facility] Group Notes:  (Clinical Social Work)  04/27/2015  11:15-12:00PM  Summary of Progress/Problems:   The main focus of today's process group was to discuss patients' feelings related to being hospitalized, as well as the difference between "being" and "having" a mental health diagnosis.  It was agreed in general by the group that it would be preferable to avoid future hospitalizations, and we discussed means of doing that.  The patient expressed her primary feeling about being hospitalized is "not good."  She was agitated during group and later did share that she believes "energy fusion like dust" is being pumped into the hospital halls, bathroom, and her room through the air conditioner and it hurts her eyes.  She refused solutions offered by group members.  Type of Therapy:  Group Therapy - Process  Participation Level:  Minimal  Participation Quality:  Resistant  Affect:  Flat and Irritable  Cognitive:  Delusional and Hallucinating  Insight:  None  Engagement in Therapy:  None  Modes of Intervention:  Exploration, Discussion  Ambrose Mantle, LCSW 04/27/2015, 12:27 PM

## 2015-04-27 NOTE — Progress Notes (Signed)
Writer entered patient's room and she was sitting in her room in the dark on her bed. Writer informed her about her scheduled medication and she asked if it was a shot Estate manager/land agent informed her that it was pill and she refused. She reported that her doctor is not giving her the right medication and she doesn't trust taking it. Writer encouraged her to talk with her doctor about her medication and she reported that it was not easy talking with her.  Writer offered her a snack and something to drink and she refused them both. She denied having pain, -si/hi/a/v but she seems to be preoccupied when in her room alone. Safety maintained on unit with 15 min checks.

## 2015-04-28 NOTE — Progress Notes (Signed)
Adult Psychoeducational Group Note  Date:  04/28/2015 Time:  1030  Group Topic/Focus:  Coping With Mental Health Crisis:   The purpose of this group is to help patients identify strategies for coping with mental health crisis.  Group discusses possible causes of crisis and ways to manage them effectively.  Participation Level:  Minimal  Participation Quality:  Inattentive  Affect:  Flat  Cognitive:  Lacking  Insight: Lacking  Engagement in Group:  Lacking  Modes of Intervention:  Discussion and Education  Additional Comments:    Gerry Heaphy L 04/28/2015, 11:14 AM

## 2015-04-28 NOTE — [facility] Group Notes (Signed)
[facility] Group Notes:  (Clinical Social Work)  04/28/2015  [facility] Group Notes:  (Clinical Social Work)  04/28/2015  11:00AM-12:00PM  Summary of Progress/Problems:  The main focus of today's process group was to listen to a variety of genres of music and to identify that different types of music provoke different responses.  The patient then was able to identify personally what was soothing for them, as well as energizing.   The patient expressed at the beginning of group that "I'm just here" and by the end of group stated, "I feel okay."  She was still and relaxed throughout group.  Type of Therapy:  Music Therapy   Participation Level:  Active  Participation Quality:  Attentive   Affect:  Flat  Cognitive:  Oriented  Insight: Improving  Engagement in Therapy:  Improving  Modes of Intervention:   Activity, Exploration  Ambrose Mantle, LCSW 04/28/2015

## 2015-04-28 NOTE — Progress Notes (Signed)
Did not attend group 

## 2015-04-28 NOTE — Progress Notes (Signed)
[facility] MD Progress Note  04/28/2015  [name]  MRN:  [id number] Subjective:  Pt states "I am not doing this . Just leave.'    Objective: Pt seen and chart reviewed.I have also reviewed notes in EHR from previous days . Patient is a 66 y old AAF with hx of schizophrenia , brought in [facility] for aggressive , disorganized behavior at home. Pt per initial notes - was aggressive with family , paranoid and delusional about cable guy who came to her house .   Pt today continues to be delusional  uncooperative and irritable .Pt today continues to be paranoid, delusional. Although there has been no disruptive issues noted on the unit, pt continues to refuse to participate in evaluation process , lacks insight in to her illness and refuses to take po medications.  Pt continues to require support and encouragement.     Principal Problem: Schizophrenia, paranoid (HCC) Diagnosis:   Patient Active Problem List   Diagnosis Date Noted  . Noncompliance with diet and medication regimen [Z91.11, Z91.14]   . Psychoses [F29]   . Hypokalemic alkalosis [E87.3] 11/11/2011  . Medically noncompliant [Z91.19] 11/11/2011  . Schizophrenia, paranoid (HCC) [F20.0] 11/07/2011   Total Time spent with patient: 25 minutes   Past Medical History:  Past Medical History  Diagnosis Date  . Schizophrenia   . Non compliance w medication regimen     Family History: Pt did not express any hx of mental illness in family.  Social History:  History  Alcohol Use  . Yes    Comment: Refuses to disclose how much     History  Drug Use No    Comment: Refuses to answer    Social History   Social History  . Marital Status: Single    Spouse Name: [name]  . Number of Children: [name]  . Years of Education: [name]   Social History Main Topics  . Smoking status: Current Every Day Smoker  . Smokeless tobacco: None  . Alcohol Use: Yes     Comment: Refuses to disclose how much  . Drug Use: No     Comment: Refuses to answer  .  Sexual Activity: Not Asked     Comment: refused to answer   Other Topics Concern  . None   Social History Narrative   Additional History:    Sleep: Fair  Appetite:  Fair   Musculoskeletal: Strength & Muscle Tone: within normal limits Gait & Station: normal Patient leans: [name]   Psychiatric Specialty Exam: Physical Exam  Constitutional: She appears well-developed.  HENT:  Head: Normocephalic.  Eyes: Pupils are equal, round, and reactive to light.  Neck: Normal range of motion.  Musculoskeletal: Normal range of motion.  Neurological: She is alert.  Skin: Skin is warm and dry.    Review of Systems  Unable to perform ROS: mental acuity    Blood pressure 96/64, pulse 72, temperature 97.6 F (36.4 C), temperature source Oral, resp. rate 16, height 5\' 3"  (1.6 m), weight 63.957 kg (141 lb).Body mass index is 24.98 kg/(m^2).  General Appearance: guarded  Eye Contact:: fair  Speech: loud , pressured   Volume: loud at times  Mood: Anxious  Affect: Inappropriate , irritable  Thought Process:linear  Orientation:states she is in the hospital , knows me as her doctor  Thought Content:seen responding to internal stimuli, delusional  Suicidal Thoughts: Did not express any  Homicidal Thoughts:Did not express any   Memory:unable to assess- seems to be intact grossly - immediate -  fair , recent - fair  Judgement: Impaired  Insight: Lacking  Psychomotor Activity: normal  Concentration: Poor  Recall: Poor  Fund of Knowledge:Poor  Language: Poor  Akathisia: No  Handed:   AIMS (if indicated):    Assets:access to health care  ADL's: Impaired  Cognition: WNL           Sleep:  Number of Hours: 6.75     Current Medications: Current Facility-Administered Medications  Medication Dose Route Frequency Provider Last Rate Last Dose  . acetaminophen (TYLENOL) tablet 650 mg  650 mg Oral Q6H PRN Charm Rings, NP      . alum & mag hydroxide-simeth  (MAALOX/MYLANTA) 200-200-20 MG/5ML suspension 30 mL  30 mL Oral Q4H PRN Charm Rings, NP      . carbamazepine (TEGRETOL) chewable tablet 300 mg  300 mg Oral BID PC Tyannah Sane, MD   300 mg at 04/16/15 1714  . diphenhydrAMINE (BENADRYL) capsule 25 mg  25 mg Oral Q8H PRN Jomarie Longs, MD       Or  . diphenhydrAMINE (BENADRYL) injection 25 mg  25 mg Intramuscular Q8H PRN Arjun Hard, MD      . haloperidol (HALDOL) tablet 5 mg  5 mg Oral Q8H PRN Jomarie Longs, MD       Or  . haloperidol lactate (HALDOL) injection 5 mg  5 mg Intramuscular Q8H PRN Sahvannah Rieser, MD      . hydrOXYzine (ATARAX/VISTARIL) tablet 25 mg  25 mg Oral BID PC Charm Rings, NP   25 mg at 04/16/15 1610  . LORazepam (ATIVAN) tablet 1 mg  1 mg Oral Q8H PRN Jomarie Longs, MD       Or  . LORazepam (ATIVAN) injection 1 mg  1 mg Intramuscular Q8H PRN Chi Garlow, MD      . magnesium hydroxide (MILK OF MAGNESIA) suspension 30 mL  30 mL Oral Daily PRN Charm Rings, NP      . OLANZapine (ZYPREXA) tablet 15 mg  15 mg Oral QHS Jomarie Longs, MD       Or  . OLANZapine (ZYPREXA) injection 15 mg  15 mg Intramuscular QHS Jomarie Longs, MD   15 mg at 04/26/15 2154  . OLANZapine (ZYPREXA) tablet 5 mg  5 mg Oral Daily Jomarie Longs, MD       Or  . OLANZapine (ZYPREXA) injection 5 mg  5 mg Intramuscular Daily Jomarie Longs, MD   5 mg at 04/28/15 0827  . traZODone (DESYREL) tablet 50 mg  50 mg Oral QHS PRN Charm Rings, NP        Lab Results:  No results found for this or any previous visit (from the past 48 hour(s)).  Physical Findings: AIMS: Facial and Oral Movements Muscles of Facial Expression: None, normal Lips and Perioral Area: None, normal Jaw: None, normal Tongue: None, normal,Extremity Movements Upper (arms, wrists, hands, fingers): None, normal Lower (legs, knees, ankles, toes): None, normal, Trunk Movements Neck, shoulders, hips: None, normal, Overall Severity Severity of abnormal movements (highest  score from questions above): None, normal Incapacitation due to abnormal movements: None, normal Patient's awareness of abnormal movements (rate only patient's report): No Awareness, Dental Status Current problems with teeth and/or dentures?: No Does patient usually wear dentures?: No  CIWA:    COWS:      Assessment: Pt is a 89 y old AAF who has a hx of schizophrenia , presented very disorganized with persecutory delusions as well as aggressive behavior.She is encouraged to  take oral medications instead of injections, however she declines at this time.   Currently on Forced medication order- will continue treatment.Sister is her legal guardian. Pt per sister with benefit from [facility] hospital and needs to be transferred as soon as bed available. However [facility] -she can only be taken if she takes po medications and is not on forced medications. Reviewed records from [place] - INPATIENT DATES - 04/18/13- 05/08/13.Pt at that time was DC ed on Invega sustenna 234 mg IM q28 days .  Pt continues to refuse PO medications - appears to be delusional .  Treatment Plan Summary: Daily contact with patient to assess and evaluate symptoms and progress in treatment and Medication management  Medications:  - Pt is on Forced medication order. -Increased Zyprexa to 20 mg po /im at bedtime for psychosis . Discussed with sister who is the legal guardian about possibley offering patient an LAI - Invega sustenna IM . However - pt may not be able to afford it at this time .CSW to fax PAP consent form to sister . -Continue Tegretol 300 mg po bid for psychosis. But she has been refusing all PO medications. -Continue Haldol  PO or IM q8h prn for agitation. -Continue Benadryl 25 mg PO/IM (EPS), Ativan 1 mg po/IM q8h prn for agitation . -Continue Trazodone  qhs prn insomnia -Reviewed EKG- wnl , TSH - wnl , lipid panel- wnl , Hba1c- 6.1 - dietician consult when pt is more stable  , PL- wnl. Repeat EKG - since she is  receiving IM zyprexa.. CSW will work on [place] referral for placement.  Medical Decision Making:  Review of Psycho-Social Stressors (1), Review of Last Therapy Session (1), Review of Medication Regimen & Side Effects (2) and Review of New Medication or Change in Dosage (2)     Tida Saner, MD 04/28/2015 12:01 PM

## 2015-04-28 NOTE — Progress Notes (Signed)
D: Pt presents easily agitated on approach. Initially, pt presented calm and cooperative. When discussing meds with pt, pt became agitated and paranoid. Pt refused to take meds PO. Pt given injectable after refusing PO Zyprexa. Pt stated that the doctor is giving her medications that are not right for her body. Pt also stated, that she do not trust the medications and will only take injections. Pt continues to isolate in her room. Pt appears withdrawn. Pt denies hallucinations but remains paranoid. Pt insight limited at this time. No bruising noted to pt right or left deltoid today. A: Medications reviewed with pt. Medications given as ordered per MD. Verbal support given. Pt encouraged to attend groups. 15 minute checks performed for safety. R: Pt suspicious about meds, irritable and easily agitated. Pt safety maintained.

## 2015-04-29 NOTE — [facility] Group Notes (Signed)
[facility] Group Notes:  (Counselor/Nursing/MHT/Case Management/Adjunct)  04/29/2015 1:15PM  Type of Therapy:  Group Therapy  Participation Level:  Active  Participation Quality:  Appropriate  Affect:  Flat  Cognitive:  Oriented  Insight:  Improving  Engagement in Group:  Limited  Engagement in Therapy:  Limited  Modes of Intervention:  Discussion, Exploration and Socialization  Summary of Progress/Problems: The topic for group was balance in life.  Pt participated in the discussion about when their life was in balance and out of balance and how this feels.  Pt discussed ways to get back in balance and short term goals they can work on to get where they want to be.  Stayed for the entire time.  Had nothing to contribute to the conversation.   [name] 04/29/2015 3:45 PM

## 2015-04-29 NOTE — Progress Notes (Signed)
D: Pt presents calm and cooperative on approach. Pt became agitated when writer offered pt PO meds. Pt refused to take PO meds and IM Zyprexa was given. Pt stated that's she's not going to take meds by mouth because she's not taking the right meds. Pt stated that she's here for detox. Pt stated that she's never had suicidal/homicidal thoughts or AVH. Pt continues to isolate in her room. Pt appears withdrawn. When pt is observed sitting in the dayroom, pt do not engage with other pts pt often sits alone and quietly A: Medications reviewed with pt. PO meds offered. Verbal support given. Pt encouraged to attend groups. 15 minute checks performed for safety. R: Pt remains paranoid.

## 2015-04-29 NOTE — Progress Notes (Signed)
Writer offered pt po meds this evening. Pt refused, requesting injection. Writer explained to pt that there is no IM injection ordered for evening time. Writer explained to pt, that the next injection will be given at bedtime. Pt stated, "Well.. I might be gone tonight because the government plan on releasing me".

## 2015-04-29 NOTE — Progress Notes (Signed)
Patient ID: [name], female   DOB: Dec 18, 1960, 54 y.o.   MRN: [id number] [facility] MD Progress Note  04/29/2015  [name]  MRN:  [id number]  Subjective:  [name] reports, "I'm here due to forced allegations by the sheriff under involuntary commitment. I don't like dealing with Dr Elna Breslow, she is on the ignorant side. I still see lights patrolling this hall ways".  Objective: Pt seen and chart reviewed. I have also reviewed notes in EHR from previous days . Patient is a 26 y old AAF with hx of schizophrenia, brought via IVC for aggressive, disorganized behavior at home. Pt per initial notes - was aggressive with family, paranoid and delusional about cable guy who came to her house .   Pt today continues to be delusional  uncooperative and irritable .Pt today continues to be paranoid, delusional. Although there has been no disruptive issues noted on the unit, pt continues to refuse to participate in evaluation process, lacks insight in to her illness and refuses to take po medications.  Pt continues to require support and encouragement.  Principal Problem: Schizophrenia, paranoid (HCC)  Diagnosis:   Patient Active Problem List   Diagnosis Date Noted  . Noncompliance with diet and medication regimen [Z91.11, Z91.14]   . Psychoses [F29]   . Hypokalemic alkalosis [E87.3] 11/11/2011  . Medically noncompliant [Z91.19] 11/11/2011  . Schizophrenia, paranoid (HCC) [F20.0] 11/07/2011   Total Time spent with patient: 25 minutes  Past Medical History:  Past Medical History  Diagnosis Date  . Schizophrenia   . Non compliance w medication regimen     Family History: Pt did not express any hx of mental illness in family.  Social History:  History  Alcohol Use  . Yes    Comment: Refuses to disclose how much     History  Drug Use No    Comment: Refuses to answer    Social History   Social History  . Marital Status: Single    Spouse Name: [name]  . Number of Children: [name]  . Years of  Education: [name]   Social History Main Topics  . Smoking status: Current Every Day Smoker  . Smokeless tobacco: None  . Alcohol Use: Yes     Comment: Refuses to disclose how much  . Drug Use: No     Comment: Refuses to answer  . Sexual Activity: Not Asked     Comment: refused to answer   Other Topics Concern  . None   Social History Narrative   Additional History:    Sleep: Fair  Appetite:  Fair  Musculoskeletal: Strength & Muscle Tone: within normal limits Gait & Station: normal Patient leans: [name]  Psychiatric Specialty Exam: Physical Exam  Constitutional: She appears well-developed.  HENT:  Head: Normocephalic.  Eyes: Pupils are equal, round, and reactive to light.  Neck: Normal range of motion.  Musculoskeletal: Normal range of motion.  Neurological: She is alert.  Skin: Skin is warm and dry.    Review of Systems  Unable to perform ROS: mental acuity    Blood pressure 108/66, pulse 65, temperature 97.7 F (36.5 C), temperature source Oral, resp. rate 16, height  (1.6 m), weight 63.957 kg (141 lb).Body mass index is 24.98 kg/(m^2).  General Appearance: guarded  Eye Contact:: fair  Speech: loud , pressured   Volume: loud at times  Mood: Anxious  Affect: Inappropriate , irritable  Thought Process:linear  Orientation:states she is in the hospital , knows me as her doctor  Thought  Content:seen responding to internal stimuli, delusional  Suicidal Thoughts: Did not express any  Homicidal Thoughts:Did not express any   Memory:unable to assess- seems to be intact grossly - immediate - fair , recent - fair  Judgement: Impaired  Insight: Lacking  Psychomotor Activity: normal  Concentration: Poor  Recall: Poor  Fund of Knowledge:Poor  Language: Poor  Akathisia: No  Handed:   AIMS (if indicated):    Assets:access to health care  ADL's: Impaired  Cognition: WNL          Sleep:  Number of Hours: 6.5    Current  Medications: Current Facility-Administered Medications  Medication Dose Route Frequency Provider Last Rate Last Dose  . acetaminophen (TYLENOL) tablet 650 mg  650 mg Oral Q6H PRN Charm Rings, NP      . alum & mag hydroxide-simeth (MAALOX/MYLANTA) 200-200-20 MG/5ML suspension 30 mL  30 mL Oral Q4H PRN Charm Rings, NP      . carbamazepine (TEGRETOL) chewable tablet 300 mg  300 mg Oral BID PC Saramma Eappen, MD   300 mg at 04/16/15 1714  . diphenhydrAMINE (BENADRYL) capsule 25 mg  25 mg Oral Q8H PRN Jomarie Longs, MD       Or  . diphenhydrAMINE (BENADRYL) injection 25 mg  25 mg Intramuscular Q8H PRN Saramma Eappen, MD      . haloperidol (HALDOL) tablet 5 mg  5 mg Oral Q8H PRN Jomarie Longs, MD       Or  . haloperidol lactate (HALDOL) injection 5 mg  5 mg Intramuscular Q8H PRN Saramma Eappen, MD      . hydrOXYzine (ATARAX/VISTARIL) tablet 25 mg  25 mg Oral BID PC Charm Rings, NP   25 mg at 04/16/15 4098  . LORazepam (ATIVAN) tablet 1 mg  1 mg Oral Q8H PRN Jomarie Longs, MD       Or  . LORazepam (ATIVAN) injection 1 mg  1 mg Intramuscular Q8H PRN Saramma Eappen, MD      . magnesium hydroxide (MILK OF MAGNESIA) suspension 30 mL  30 mL Oral Daily PRN Charm Rings, NP      . OLANZapine (ZYPREXA) tablet 15 mg  15 mg Oral QHS Jomarie Longs, MD       Or  . OLANZapine (ZYPREXA) injection 15 mg  15 mg Intramuscular QHS Saramma Eappen, MD   15 mg at 04/28/15 2200  . OLANZapine (ZYPREXA) tablet 5 mg  5 mg Oral Daily Jomarie Longs, MD       Or  . OLANZapine (ZYPREXA) injection 5 mg  5 mg Intramuscular Daily Jomarie Longs, MD   5 mg at 04/29/15 0815  . traZODone (DESYREL) tablet 50 mg  50 mg Oral QHS PRN Charm Rings, NP        Lab Results:  No results found for this or any previous visit (from the past 48 hour(s)).  Physical Findings: AIMS: Facial and Oral Movements Muscles of Facial Expression: None, normal Lips and Perioral Area: None, normal Jaw: None, normal Tongue: None,  normal,Extremity Movements Upper (arms, wrists, hands, fingers): None, normal Lower (legs, knees, ankles, toes): None, normal, Trunk Movements Neck, shoulders, hips: None, normal, Overall Severity Severity of abnormal movements (highest score from questions above): None, normal Incapacitation due to abnormal movements: None, normal Patient's awareness of abnormal movements (rate only patient's report): No Awareness, Dental Status Current problems with teeth and/or dentures?: No Does patient usually wear dentures?: No  CIWA:    COWS:  Assessment: Pt is a 89 y old AAF who has a hx of schizophrenia , presented very disorganized with persecutory delusions as well as aggressive behavior.She is encouraged to take oral medications instead of injections, however she declines at this time.   Currently on Forced medication order- will continue treatment.Sister is her legal guardian. Pt per sister with benefit [facility] hospital and needs to be transferred as soon as bed available. However [facility] -she can only be [facility] if she takes po medications and is not on forced medications. Reviewed records from [place] - INPATIENT DATES - 04/18/13- 05/08/13.Pt at that time was DC ed on Invega sustenna 234 mg IM q28 days .  Pt continues to refuse PO medications - appears to be delusional .  Treatment Plan Summary: Daily contact with patient to assess and evaluate symptoms and progress in treatment and Medication management  Medications:  - Pt is on Forced medication order. -Increased Zyprexa to 20 mg po /im at bedtime for psychosis . Discussed with sister who is the legal guardian about possibley offering patient an LAI - Invega sustenna IM . However - pt may not be able to afford it at this time .CSW to fax PAP consent form to sister . -Continue Tegretol 300 mg po bid for psychosis. But she has been refusing all PO medications. -Continue Haldol 5mg  PO or IM q8h prn for agitation. -Continue Benadryl 25 mg PO/IM  (EPS), Ativan 1 mg po/IM q8h prn for agitation . -Continue Trazodone 50mg  qhs prn insomnia -Reviewed EKG- wnl , TSH - wnl , lipid panel- wnl , Hba1c- 6.1 - dietician consult when pt is more stable  , PL- wnl. Repeat EKG - since she is receiving IM zyprexa.. CSW will work on [place] referral for placement.  Medical Decision Making:  Review of Psycho-Social Stressors (1), Review of Last Therapy Session (1), Review of Medication Regimen & Side Effects (2) and Review of New Medication or Change in Dosage (2)  Sanjuana Kava, PMHNP, FNP-BC 04/29/2015 12:01 PM I agree with assessment and plan [name] A. Dub Mikes, M.D.

## 2015-04-29 NOTE — Progress Notes (Signed)
Patient ID: [name], female   DOB: [birth year], 54 y.o.   MRN: [id number] PER STATE REGULATIONS 482.30  THIS CHART WAS REVIEWED FOR MEDICAL NECESSITY WITH RESPECT TO THE PATIENT'S ADMISSION/ DURATION OF STAY.  NEXT REVIEW DATE:   05/02/2015  Willa Rough, RN, BSN CASE MANAGER

## 2015-04-30 MED ORDER — PALIPERIDONE PALMITATE 234 MG/1.5ML IM SUSP
234.0000 mg | Freq: Once | INTRAMUSCULAR | Status: AC
  Administered 2015-04-30: 234 mg via INTRAMUSCULAR
  Filled 2015-04-30: qty 1.5

## 2015-04-30 MED ORDER — OLANZAPINE 5 MG PO TABS: 5.0000 mg | ORAL_TABLET | Freq: Four times a day (QID) | ORAL | Status: DC | PRN

## 2015-04-30 MED ORDER — OLANZAPINE 10 MG IM SOLR: 5.0000 mg | Freq: Four times a day (QID) | INTRAMUSCULAR | Status: DC | PRN

## 2015-04-30 MED ORDER — PALIPERIDONE PALMITATE 156 MG/ML IM SUSP
156.0000 mg | Freq: Once | INTRAMUSCULAR | Status: AC
  Administered 2015-05-06: 156 mg via INTRAMUSCULAR
  Filled 2015-04-30: qty 1

## 2015-04-30 NOTE — Progress Notes (Signed)
Pt observed in her room sitting on the bed.  Pt stayed in her room for the entire evening and did not attend group.  Pt denied SI/HI/AVH, depression, and anxiety and was irritated when writer asked her about these.  Pt remains safe on the unit.

## 2015-04-30 NOTE — Progress Notes (Signed)
D: Pt remains paranoid and delusional, pt continues to believe that she will be released by the government. Pt refusing to take meds PO. Pt continues to believed that she is here for detox. Pt continues to report that we are not treating her with the right medications.  Pt appears withdrawn. Pt do not engaged with others on the unit. Pt sits with her head held down looking at the ground. Pt denies SI/HI/AVH.  A: Medications reviewed with pt. PO meds offered to pt. Verbal support given. Pt encouraged to attend groups. 15 minute checks performed for safety.  R: Pt safety maintained.

## 2015-04-30 NOTE — Progress Notes (Signed)
D: Pt was sitting on the bench in her room. When asked about her day pt stated, "A government baby said I might be leaving here tonight". Writer informed pt that discharges are usually performed in the morning.  Assured her that she wouldn't be discharged tonight.  Writer attempted to encourage the participate with group. Pt stated she didn't like groups and didn't want to hear "the people talking". Also states the she hates when the t.v. Is on. Pt voiced no questions or concerns.  A:  Support and encouragement was offered. 15 min checks continued for safety.  R: Pt remains safe.

## 2015-04-30 NOTE — [facility] Group Notes (Signed)
[facility] Group Notes:  (Counselor/Nursing/MHT/Case Management/Adjunct)  04/30/2015 1:15PM  Type of Therapy:  Group Therapy  Participation Level:  Active  Participation Quality:  Appropriate  Affect:  Flat  Cognitive:  Oriented  Insight:  Improving  Engagement in Group:  Limited  Engagement in Therapy:  Limited  Modes of Intervention:  Discussion, Exploration and Socialization  Summary of Progress/Problems: The topic for group was balance in life.  Pt participated in the discussion about when their life was in balance and out of balance and how this feels.  Pt discussed ways to get back in balance and short term goals they can work on to get where they want to be.  Minimal participation, but stayed the whole time.  "I'm undecided about my state of balance.  The last time I was balanced was 20 years ago.  They just keep putting me through it."   [name] B 04/30/2015 1:22 PM

## 2015-04-30 NOTE — Tx Team (Signed)
Interdisciplinary Treatment Plan Update (Adult)  Date:  04/30/2015   Time Reviewed:  1:24 PM   Progress in Treatment: Attending groups: Yes Participating in groups:  Yes Taking medication as prescribed:  Yes. Tolerating medication:  Yes. Family/Significant other contact made:  Yes Patient understands diagnosis:  No  Limited insight Discussing patient identified problems/goals with staff:  Yes, see initial care plan. Medical problems stabilized or resolved:  Yes. Denies suicidal/homicidal ideation: Yes. Issues/concerns per patient self-inventory:  No. Other:  New problem(s) identified:  Discharge Plan or Barriers:  See below  Reason for Continuation of Hospitalization: Delusions  Medication stabilization Other; describe Paranoia  Comments:   [name] is a 54 y.o. female history of schizophrenia here presenting with agitation and aggressive behavior. The cable company was at her house earlier and she became aggressive towards her as well as towards her mother. She was called and she was IVC. On exam, patient refused to answer questions and feels that everyone is here to kill her and are evil. I was unable to examine her because she refused. She was given Ativan and Benadryl for agitation.  Second opinion delivered to force meds as pt was refusing.  She is now taking Haldol, Tegretol PO Estimated length of stay: 4-5 days  9/30:  Patient referred for transfer to [facility], IVC paperwork renewed.  Continues to have limited insight, experiencing AVH.  Is taking meds IM only, refuses PO  04/24/15:  Still taking meds IM only   Will re-refer to [facility] when taking meds PO 04/30/15  Continues IM only.  Less symptomatic.  Will return home next Tuesday after getting Outpt commitment for [facility] goal(s):  Review of initial/current patient goals per problem list:   Review of initial/current patient goals per problem list:  1. Goal(s): Patient will participate in aftercare  plan   Met: Yes   Target date: 3-5 days post admission date   As evidenced by: Patient will participate within aftercare plan AEB aftercare provider and housing plan at discharge being identified.  04/19/15 Working on referral to [facility] per request of guardian 04/24/15  [facility] grounds of her unwillingness to take meds PO 04/30/15  Jonny has an [place] Maintenance dose on board.  She will get one more prior to d/c.  She will return home with her family and follow up at [facility] on an outpt commitment       5. Goal(s): Patient will demonstrate decreased signs of psychosis  * Met: No  * Target date: 3-5 days post admission date  * As evidenced by: Patient will demonstrate decreased frequency of AVH or return to baseline function 04/15/15   Presents with paranoia, disorganization, mood lability due to non compliance with meds, oupt tx 04/19/15:  Continues to appear to respond to internal stimuli, AH, irritable, labile mood 04/24/15:  Mood is more stable.  Other symptoms remain.  Pt continues to refuse meds PO; has been getting IM since day one. 04/30/15  Still refusing PO, so gets injection daily.        Attendees: Patient:  04/30/2015 1:24 PM   Family:   04/30/2015 1:24 PM   Physician:  Ursula Alert, MD 04/30/2015 1:24 PM   Nursing:   Manuella Ghazi, RN 04/30/2015 1:24 PM   CSW:    Ripley Fraise, [place]   04/30/2015 1:24 PM   Other:  04/30/2015 1:24 PM   Other:   04/30/2015 1:24 PM   Other:  Lars Pinks,  Nurse CM 04/30/2015 1:24 PM   Other:  Lucinda Dell, [facility] 04/30/2015 1:24 PM   Other:  Norberto Sorenson, [place]  04/30/2015 1:24 PM   Other:  04/30/2015 1:24 PM   Other:  04/30/2015 1:24 PM   Other:  04/30/2015 1:24 PM   Other:  04/30/2015 1:24 PM   Other:  04/30/2015 1:24 PM   Other:   04/30/2015 1:24 PM    Scribe for Treatment Team:   Edwyna Shell, LCSW Clinical Social Worker , 04/30/2015 1:24 PM

## 2015-04-30 NOTE — [facility] Group Notes (Signed)
Pt attended group stated she didn't have a goal for today. Pt did not have a request for anything this evening. Jesiah Grismer Cathey MHT

## 2015-04-30 NOTE — Progress Notes (Signed)
Pt sitting in her room, with her head held down, looking at the floor. Writer offered and encouraged pt to take meds PO this evening. Pt refused.

## 2015-04-30 NOTE — Progress Notes (Signed)
Patient ID: [name], female   DOB: 09-Dec-1960, 54 y.o.   MRN: [id number] [facility] MD Progress Note  04/30/2015  [name]  MRN:  [id number]  Subjective:  Arzu reports, "I'm not here to get any help. I was forced to come here.'  Objective: Pt seen and chart reviewed. I have also reviewed notes in EHR from previous days . Patient is a 54 y old AAF with hx of schizophrenia, brought via IVC for aggressive, disorganized behavior at home. Pt per initial notes - was aggressive with family, paranoid and delusional about cable guy who came to her house .   Pt today continues to be delusional . Pt was initially calm during assessment today , but then started becoming irritable and loud.  Pt per nursing seen as calm on the unit , sleep is fair , appetite is fair. She attends group activities - but is usually quite and does not interact. Pt seen as refusing all her PO medications , continues to believe she does not need any help . Pt continues to require support and encouragement.  Principal Problem: Schizophrenia, paranoid (HCC)  Diagnosis:   Patient Active Problem List   Diagnosis Date Noted  . Noncompliance with diet and medication regimen [Z91.11, Z91.14]   . Psychoses [F29]   . Hypokalemic alkalosis [E87.3] 11/11/2011  . Medically noncompliant [Z91.19] 11/11/2011  . Schizophrenia, paranoid (HCC) [F20.0] 11/07/2011   Total Time spent with patient: 25 minutes  Past Medical History:  Past Medical History  Diagnosis Date  . Schizophrenia   . Non compliance w medication regimen     Family History: Pt did not express any hx of mental illness in family.  Social History:  History  Alcohol Use  . Yes    Comment: Refuses to disclose how much     History  Drug Use No    Comment: Refuses to answer    Social History   Social History  . Marital Status: Single    Spouse Name: [name]  . Number of Children: [name]  . Years of Education: [name]   Social History Main Topics  . Smoking status:  Current Every Day Smoker  . Smokeless tobacco: None  . Alcohol Use: Yes     Comment: Refuses to disclose how much  . Drug Use: No     Comment: Refuses to answer  . Sexual Activity: Not Asked     Comment: refused to answer   Other Topics Concern  . None   Social History Narrative   Additional History:    Sleep: Fair  Appetite:  Fair  Musculoskeletal: Strength & Muscle Tone: within normal limits Gait & Station: normal Patient leans: [name]  Psychiatric Specialty Exam: Physical Exam  Constitutional: She appears well-developed.  HENT:  Head: Normocephalic.  Eyes: Pupils are equal, round, and reactive to light.  Neck: Normal range of motion.  Musculoskeletal: Normal range of motion.  Neurological: She is alert.  Skin: Skin is warm and dry.    Review of Systems  Psychiatric/Behavioral: Negative for depression.       States she is not here for any help  All other systems reviewed and are negative.   Blood pressure 107/68, pulse 69, temperature 98.1 F (36.7 C), temperature source Oral, resp. rate 16, height 5\' 3"  (1.6 m), weight 63.957 kg (141 lb).Body mass index is 24.98 kg/(m^2).  General Appearance: guarded  Eye Contact:: fair  Speech:normal rate  loud ,at times- but was calm initially  Volume: loud at times  Mood:angry  Affect: calm initially , but gets irritable during evaluation  Thought Process:linear  Orientation:states she is in the hospital , knows me as her doctor  Thought Content:seen responding to internal stimuli, delusional  Suicidal Thoughts: Did not express any  Homicidal Thoughts:Did not express any   Memory:unable to assess- seems to be intact grossly - immediate - fair , recent - fair  Judgement: Impaired  Insight: Lacking  Psychomotor Activity: normal  Concentration: Poor  Recall: Poor  Fund of Knowledge:Poor  Language: Poor  Akathisia: No  Handed:   AIMS (if indicated):    Assets:access to health care  ADL's:  Impaired  Cognition: WNL          Sleep:  Number of Hours: 6.75    Current Medications: Current Facility-Administered Medications  Medication Dose Route Frequency Provider Last Rate Last Dose  . acetaminophen (TYLENOL) tablet 650 mg  650 mg Oral Q6H PRN Charm Rings, NP      . alum & mag hydroxide-simeth (MAALOX/MYLANTA) 200-200-20 MG/5ML suspension 30 mL  30 mL Oral Q4H PRN Charm Rings, NP      . carbamazepine (TEGRETOL) chewable tablet 300 mg  300 mg Oral BID PC Champ Keetch, MD   300 mg at 04/16/15 1714  . diphenhydrAMINE (BENADRYL) capsule 25 mg  25 mg Oral Q8H PRN Jomarie Longs, MD       Or  . diphenhydrAMINE (BENADRYL) injection 25 mg  25 mg Intramuscular Q8H PRN Timi Reeser, MD      . hydrOXYzine (ATARAX/VISTARIL) tablet 25 mg  25 mg Oral BID PC Charm Rings, NP   25 mg at 04/16/15 1191  . LORazepam (ATIVAN) tablet 1 mg  1 mg Oral Q8H PRN Jomarie Longs, MD       Or  . LORazepam (ATIVAN) injection 1 mg  1 mg Intramuscular Q8H PRN Josephus Harriger, MD      . magnesium hydroxide (MILK OF MAGNESIA) suspension 30 mL  30 mL Oral Daily PRN Charm Rings, NP      . OLANZapine (ZYPREXA) tablet 5 mg  5 mg Oral Q6H PRN Jomarie Longs, MD       Or  . OLANZapine (ZYPREXA) injection 5 mg  5 mg Intramuscular Q6H PRN Jomarie Longs, MD      . Melene Muller ON 05/06/2015] paliperidone (INVEGA SUSTENNA) injection 156 mg  156 mg Intramuscular Once Kaylaann Mountz, MD      . traZODone (DESYREL) tablet 50 mg  50 mg Oral QHS PRN Charm Rings, NP        Lab Results:  No results found for this or any previous visit (from the past 48 hour(s)).  Physical Findings: AIMS: Facial and Oral Movements Muscles of Facial Expression: None, normal Lips and Perioral Area: None, normal Jaw: None, normal Tongue: None, normal,Extremity Movements Upper (arms, wrists, hands, fingers): None, normal Lower (legs, knees, ankles, toes): None, normal, Trunk Movements Neck, shoulders, hips: None, normal,  Overall Severity Severity of abnormal movements (highest score from questions above): None, normal Incapacitation due to abnormal movements: None, normal Patient's awareness of abnormal movements (rate only patient's report): No Awareness, Dental Status Current problems with teeth and/or dentures?: No Does patient usually wear dentures?: No  CIWA:    COWS:      Assessment: Pt is a 75 y old AAF who has a hx of schizophrenia , presented very disorganized with persecutory delusions as well as aggressive behavior.She is encouraged to take oral medications instead of injections, however  she declines at this time.   Currently on Forced medication order- will continue treatment.Sister is her legal guardian. Pt per sister with benefit from [facility] hospital and needs to be transferred as soon as bed available. However [facility] -she can only be taken if she takes po medications and is not on forced medications. Reviewed records from [place] - INPATIENT DATES - 04/18/13- 05/08/13.Pt at that time was DC ed on Invega sustenna 234 mg IM q28 days .  Pt continues to refuse PO medications - appears to be delusional .Will provide first dose of Invega sustenna IM today- she tolerated this well in the past.  Treatment Plan Summary: Daily contact with patient to assess and evaluate symptoms and progress in treatment and Medication management  Medications:  - Pt is on Forced medication order. -Will make Zyprexa 5 mg po q6 h prn for anxiety/agitation/psychosis. -Will provide Invega Sustenna 234 mg IM x 1 dose today. Pt tolerated it well in the past. Will give another dose of Invega Sustenna 156 mg IM in 5-7 days. Pt can be maintained on Invega Sustenna 117 mg IM q28 days . Pt will also be referred for an out patient commitment of 180 days when stable to be discharged. In the mean time - observe pt on the unit. Encourage pt to take her PO medications. -Continue Tegretol 300 mg po bid for psychosis. But she has been  refusing all PO medications. -Continue Trazodone 50 mg qhs prn insomnia CSW will work on disposition.  Medical Decision Making:  Review of Psycho-Social Stressors (1), Review of Last Therapy Session (1), Review of Medication Regimen & Side Effects (2) and Review of New Medication or Change in Dosage (2)  Connor Foxworthy, MD 04/30/2015 12:01 PM

## 2015-04-30 NOTE — [facility] Group Notes (Signed)
[facility] Group Notes:  (Nursing/MHT/Case Management/Adjunct)  Date:  04/30/2015  Time: 0930 Type of Therapy:  Nurse Education  Participation Level:  Did Not Attend   Summary of Progress/Problems: Topic was on recovery.  Patient invited but did not attend. Mickie Bail 04/30/2015, 10:45 AM

## 2015-05-01 NOTE — Progress Notes (Signed)
Patient ID: [name], female   DOB: 28-Feb-1961, 54 y.o.   MRN: [id number] [facility] MD Progress Note  05/01/2015  [name]  MRN:  [id number]  Subjective:  [name] reports, "I'm fine.'  Objective: Pt seen and chart reviewed. I have also reviewed notes in EHR from previous days . Patient is a 63 y old AAF with hx of schizophrenia, brought via IVC for aggressive, disorganized behavior at home. Pt per initial notes - was aggressive with family, paranoid and delusional about cable guy who came to her house .   Pt today continues to be delusional and paranoid. She continues to be uncooperative during assessment - states " I am fine " then stops answering questions or walks out of the room. Pt started on Invega yesterday. Pt to be observed on the unit - inorder to assess how she tolerates it. Pt continues to be on forced medication order. Pt continues to require support and encouragement.  Principal Problem: Schizophrenia, paranoid (HCC)  Diagnosis:   Patient Active Problem List   Diagnosis Date Noted  . Noncompliance with diet and medication regimen [Z91.11, Z91.14]   . Psychoses [F29]   . Hypokalemic alkalosis [E87.3] 11/11/2011  . Medically noncompliant [Z91.19] 11/11/2011  . Schizophrenia, paranoid (HCC) [F20.0] 11/07/2011   Total Time spent with patient: 25 minutes  Past Medical History:  Past Medical History  Diagnosis Date  . Schizophrenia   . Non compliance w medication regimen     Family History: Pt did not express any hx of mental illness in family.  Social History:  History  Alcohol Use  . Yes    Comment: Refuses to disclose how much     History  Drug Use No    Comment: Refuses to answer    Social History   Social History  . Marital Status: Single    Spouse Name: [name]  . Number of Children: [name]  . Years of Education: [name]   Social History Main Topics  . Smoking status: Current Every Day Smoker  . Smokeless tobacco: None  . Alcohol Use: Yes     Comment: Refuses  to disclose how much  . Drug Use: No     Comment: Refuses to answer  . Sexual Activity: Not Asked     Comment: refused to answer   Other Topics Concern  . None   Social History Narrative   Additional History:    Sleep: Fair  Appetite:  Fair  Musculoskeletal: Strength & Muscle Tone: within normal limits Gait & Station: normal Patient leans: [name]  Psychiatric Specialty Exam: Physical Exam  Constitutional: She appears well-developed.  HENT:  Head: Normocephalic.  Eyes: Pupils are equal, round, and reactive to light.  Neck: Normal range of motion.  Musculoskeletal: Normal range of motion.  Neurological: She is alert.  Skin: Skin is warm and dry.    Review of Systems  Psychiatric/Behavioral: Negative for depression.       States she is not here for any help  All other systems reviewed and are negative.   Blood pressure 95/55, pulse 116, temperature 99.3 F (37.4 C), temperature source Oral, resp. rate 16, height 5\' 3"  (1.6 m), weight 63.957 kg (141 lb).Body mass index is 24.98 kg/(m^2).  General Appearance: guarded  Eye Contact:: fair  Speech:normal rate  loud ,at times- but was calm initially  Volume: loud at times  Mood:angry  Affect: calm initially , but gets irritable during evaluation  Thought Process:linear  Orientation:states she is in the hospital ,  knows me as her doctor  Thought Content:seen responding to internal stimuli, delusional  Suicidal Thoughts: Did not express any  Homicidal Thoughts:Did not express any   Memory:unable to assess- seems to be intact grossly - immediate - fair , recent - fair  Judgement: Impaired  Insight: Lacking  Psychomotor Activity: normal  Concentration: Poor  Recall: Poor  Fund of Knowledge:Poor  Language: Poor  Akathisia: No  Handed:   AIMS (if indicated):    Assets:access to health care  ADL's: Impaired  Cognition: WNL          Sleep:  Number of Hours: 6.75    Current  Medications: Current Facility-Administered Medications  Medication Dose Route Frequency Provider Last Rate Last Dose  . acetaminophen (TYLENOL) tablet 650 mg  650 mg Oral Q6H PRN Charm Rings, NP      . alum & mag hydroxide-simeth (MAALOX/MYLANTA) 200-200-20 MG/5ML suspension 30 mL  30 mL Oral Q4H PRN Charm Rings, NP      . carbamazepine (TEGRETOL) chewable tablet 300 mg  300 mg Oral BID PC Meshia Rau, MD   300 mg at 04/16/15 1714  . diphenhydrAMINE (BENADRYL) capsule 25 mg  25 mg Oral Q8H PRN Jomarie Longs, MD       Or  . diphenhydrAMINE (BENADRYL) injection 25 mg  25 mg Intramuscular Q8H PRN Willow Reczek, MD      . hydrOXYzine (ATARAX/VISTARIL) tablet 25 mg  25 mg Oral BID PC Charm Rings, NP   25 mg at 04/16/15 9562  . LORazepam (ATIVAN) tablet 1 mg  1 mg Oral Q8H PRN Jomarie Longs, MD       Or  . LORazepam (ATIVAN) injection 1 mg  1 mg Intramuscular Q8H PRN Anyela Napierkowski, MD      . magnesium hydroxide (MILK OF MAGNESIA) suspension 30 mL  30 mL Oral Daily PRN Charm Rings, NP      . OLANZapine (ZYPREXA) tablet 5 mg  5 mg Oral Q6H PRN Jomarie Longs, MD       Or  . OLANZapine (ZYPREXA) injection 5 mg  5 mg Intramuscular Q6H PRN Jomarie Longs, MD      . Melene Muller ON 05/06/2015] paliperidone (INVEGA SUSTENNA) injection 156 mg  156 mg Intramuscular Once Nylah Butkus, MD      . traZODone (DESYREL) tablet 50 mg  50 mg Oral QHS PRN Charm Rings, NP        Lab Results:  No results found for this or any previous visit (from the past 48 hour(s)).  Physical Findings: AIMS: Facial and Oral Movements Muscles of Facial Expression: None, normal Lips and Perioral Area: None, normal Jaw: None, normal Tongue: None, normal,Extremity Movements Upper (arms, wrists, hands, fingers): None, normal Lower (legs, knees, ankles, toes): None, normal, Trunk Movements Neck, shoulders, hips: None, normal, Overall Severity Severity of abnormal movements (highest score from questions above):  None, normal Incapacitation due to abnormal movements: None, normal Patient's awareness of abnormal movements (rate only patient's report): No Awareness, Dental Status Current problems with teeth and/or dentures?: No Does patient usually wear dentures?: No  CIWA:    COWS:      Assessment: Pt is a 48 y old AAF who has a hx of schizophrenia , presented very disorganized with persecutory delusions as well as aggressive behavior.She is encouraged to take oral medications instead of injections, however she declines at this time.   Currently on Forced medication order- will continue treatment.Sister is her legal guardian. Pt per sister  with benefit from [facility] hospital and needs to be transferred as soon as [facility] available. However [facility] -she can only be taken if she takes po medications and is not on forced medications. Reviewed records from [place] - INPATIENT DATES - 04/18/13- 05/08/13.Pt at that time was DC ed on Invega sustenna 234 mg IM q28 days .  Pt continues to refuse PO medications - appears to be delusional and paranoid. Will continue treatment.  Treatment Plan Summary: Daily contact with patient to assess and evaluate symptoms and progress in treatment and Medication management  Medications:  - Pt is on Forced medication order. -Will make Zyprexa 5 mg po/IM  q6 h prn for anxiety/agitation/psychosis. -Provided Invega Sustenna 234 mg IM X 1 Dose on 04/30/15 .Will give another dose of Invega Sustenna 156 mg IM in 5-7 days. Pt can be maintained on Invega Sustenna 117 mg IM q28 days . Pt to be referred for an out patient commitment of 180 days when stable to be discharged. In the mean time - observe pt on the unit. Encourage pt to take her PO medications. -Continue Tegretol 300 mg po bid for psychosis. But she has been refusing all PO medications. -Continue Trazodone 50 mg qhs prn insomnia CSW will work on disposition.  Medical Decision Making:  Review of Psycho-Social Stressors (1), Review  of Last Therapy Session (1), Review of Medication Regimen & Side Effects (2) and Review of New Medication or Change in Dosage (2)  Christiane Sistare, MD 05/01/2015 1:36 PM

## 2015-05-01 NOTE — Progress Notes (Signed)
D: Pt remains paranoid.  Pt continues to refuse PO meds. Pt continues to isolate in her room and appears withdrawn. Pt continues to sit with her head held down, staring at the floor. Pt labile and irritable when encouraging pt to take PO meds. Pt denies SI/HI/AVH.  A: Medications offered to pt. Medications reviewed with pt. Verbal support given. Pt encouraged to attend groups. 15 minute checks performed for safety. R: Pt safety maintained.

## 2015-05-01 NOTE — [facility] Group Notes (Signed)
[facility]  05/01/2015 , 3:50 PM    Type of Therapy:  [facility] Presentation  Participation Level:  Active  Participation Quality:  Attentive  Affect:  Blunted  Cognitive:  Oriented  Insight:  Limited  Engagement in Therapy:  Engaged  Modes of Intervention:  Discussion, Education and Socialization  Summary of Progress/Problems:  Onalee HuaDavid from [employer] came to present his recovery story and play the guitar.  Came to group and stayed the entire time.  Daryel Geraldorth, Maven Rosander B 05/01/2015 , 3:50 PM

## 2015-05-01 NOTE — Progress Notes (Signed)
Recreation Therapy Notes  10.12.2016 @ approximately 2:50pm, LRT attempted to work with patient 1:1, however patient observed to be sleeping in bed. LRT called patient name, however got no response. LRT will continue to attempt to work with patient 1:1 during admission.   Marykay Lexenise L Keelon Zurn, LRT/CTRS  Jearl KlinefelterBlanchfield, Hektor Huston L 05/01/2015 3:47 PM

## 2015-05-01 NOTE — Progress Notes (Signed)
D: [name] is sitting in her room on her bed upon my arrival. I introduce myself. She states today was like any other day. She denies SI. When I inquired about AVH she states she is seeing "the lies" all day long coming in and out of the unit. They are not telling her to harm herself then states "Oh that will never happen". She says "the lies" are never helpful and they usually are dropped down from outer space. She says the medicine she is taking is helping to merge the toxins out of her body. She contracts for safety. She told me she does not attend group or participate however she did attend group tonight.  A: Encouragement and support given. R: Will continue to monitor for patient's safety.

## 2015-05-01 NOTE — [facility] Group Notes (Signed)
[facility]   05/01/2015 1:35 PM  Participation Quality:    Mood/Affect:    Depression Rating:    Anxiety Rating:    Thoughts of Suicide:   Will you contract for safety?     Current AVH:    Plan for Discharge/Comments:  "I'm waiting for the government to release me."  When asked to say more about this, stated she was supposed to have court on Tuesday, and now she wonders why it did not happen, and when it will be.  Told her I would look in her chart for her to see if there is paperwork saying when it will be.  Transportation Means:   Supports:  Daryel GeraldNorth, Rozanna Cormany B

## 2015-05-02 NOTE — Progress Notes (Signed)
Adult Psychoeducational Group Note  Date:  05/02/2015 Time:  0930  Group Topic/Focus:  Orientation:   The focus of this group is to educate the patient on the purpose and policies of crisis stabilization and provide a format to answer questions about their admission.  The group details unit policies and expectations of patients while admitted.  Participation Level:  Minimal  Participation Quality:  Sharing  Affect:  Flat  Cognitive:  Appropriate and Lacking  Insight: Improving  Engagement in Group:  Improving  Modes of Intervention:  Discussion and Education  Additional Comments:    Navi Erber L 05/02/2015, 10:40 AM

## 2015-05-02 NOTE — Progress Notes (Signed)
Patient ID: [name], female   DOB: [birth year], 54 y.o.   MRN: [id number] PER STATE REGULATIONS 482.30  THIS CHART WAS REVIEWED FOR MEDICAL NECESSITY WITH RESPECT TO THE PATIENT'S ADMISSION/ DURATION OF STAY.  NEXT REVIEW DATE:  05/06/2015  [name] Paula Evora, RN, BSN CASE MANAGER

## 2015-05-02 NOTE — Progress Notes (Signed)
D: Pt presents with flat affect. Pt have poor eye contact. Pt also have no interaction with other pts on the milieu.  Pt isolative and appears withdrawn. Pt continues to refused PO meds when offered. Pt have poor insight for tx.  Pt stated that she was asked to attend groups and reported that she do attend groups, but feels awkward when she does attend because she's not here for psychological problems. Pt attended group this morning and when asked to share something she enjoys doing, pt stated, "I like electrical stuff, I don't enjoy it, but that's what I like". Pt then held her head back down, staring at the floor.  A: Medications reviewed with pt. Pt encouraged to take meds by mouth. Verbal support given. Pt encouraged to attend groups and engage. 15 minute checks performed for safety. R: Pt safety maintained.

## 2015-05-02 NOTE — Clinical Social Work Note (Signed)
Call from public defender, Corliss BlackerCassandra Weatherford [phone]) requesting information on outpatient commitment.  CSW returned call, left VM.  Santa GeneraAnne Cunningham, LCSW Clinical Social Worker

## 2015-05-02 NOTE — Progress Notes (Signed)
Psychoeducational Group Note  Date:  05/02/2015 Time:  2338  Group Topic/Focus:  Wrap-Up Group:   The focus of this group is to help patients review their daily goal of treatment and discuss progress on daily workbooks.  Participation Level: Did Not Attend  Participation Quality:  Not Applicable  Affect:  Not Applicable  Cognitive:  Not Applicable  Insight:  Not Applicable  Engagement in Group: Not Applicable  Additional Comments:  The patient politely refused to attend group and remained in her bedroom.  Hazle CocaGOODMAN, Saje Gallop S 05/02/2015, 11:38 PM

## 2015-05-02 NOTE — Progress Notes (Signed)
Patient ID: [name], female   DOB: [birth year], 54 y.o.   MRN: [id number] D: Client in room most of this shift, denies any symptoms, but clearly preoccupied. Client forward little, but pleasant. A: Writer encouraged interaction. Client encouraged to alert staff with any concerns. Staff will monitor q4415min for safety. R:Client is safe on the unit, did not attend karaoke.

## 2015-05-02 NOTE — [facility] Group Notes (Signed)
[facility] LCSW Group Therapy  05/02/2015 3:20 PM  Type of Therapy:  Group Therapy  Participation Level:  Minimal  Participation Quality:  None  Affect:  Flat  Cognitive:  Lacking  Insight:  Limited  Engagement in Therapy:  None  Modes of Intervention:  Discussion, Exploration, Problem-solving and Support  Today's Topic: Overcoming Obstacles. Patients identified one short term goal and potential obstacles in reaching this goal. Patients processed barriers involved in overcoming these obstacles. Patients identified steps necessary for overcoming these obstacles and explored motivation (internal and external) for facing these difficulties head on.   Patient sat quietly in back of room, did not participate even when prompted.     Sallee LangeCunningham, Hermila Millis C 05/02/2015, 3:20 PM

## 2015-05-02 NOTE — Progress Notes (Signed)
Patient ID: [name], female   DOB: 06-24-1961, 54 y.o.   MRN: [id number] [facility] MD Progress Note  05/02/2015  [name]  MRN:  [id number]  Subjective:  [name] reports, "I'm OK."  Objective: Pt seen and chart reviewed. I have also reviewed notes in EHR from previous days . Patient is a 54 y old AAF with hx of schizophrenia, brought via IVC for aggressive, disorganized behavior at home. Pt per initial notes - was aggressive with family, paranoid and delusional about cable guy who came to her house .   Pt today continues to be delusional and paranoid. She avoids eye contact while writer evaluated her this AM. However she is more receptive to questions asked. Pt is less irritable than yesterday, is alert, oriented x3. Pt denies any SI/HI- however continues to be delusional , paranoid. Pt continues to require support and encouragement.  Principal Problem: Schizophrenia, paranoid (HCC)  Diagnosis:   Patient Active Problem List   Diagnosis Date Noted  . Noncompliance with diet and medication regimen [Z91.11, Z91.14]   . Psychoses [F29]   . Hypokalemic alkalosis [E87.3] 11/11/2011  . Medically noncompliant [Z91.19] 11/11/2011  . Schizophrenia, paranoid (HCC) [F20.0] 11/07/2011   Total Time spent with patient: 25 minutes  Past Medical History:  Past Medical History  Diagnosis Date  . Schizophrenia   . Non compliance w medication regimen     Family History: Pt did not express any hx of mental illness in family.  Social History:  History  Alcohol Use  . Yes    Comment: Refuses to disclose how much     History  Drug Use No    Comment: Refuses to answer    Social History   Social History  . Marital Status: Single    Spouse Name: [name]  . Number of Children: [name]  . Years of Education: [name]   Social History Main Topics  . Smoking status: Current Every Day Smoker  . Smokeless tobacco: None  . Alcohol Use: Yes     Comment: Refuses to disclose how much  . Drug Use: No   Comment: Refuses to answer  . Sexual Activity: Not Asked     Comment: refused to answer   Other Topics Concern  . None   Social History Narrative   Additional History:    Sleep: Fair  Appetite:  Fair  Musculoskeletal: Strength & Muscle Tone: within normal limits Gait & Station: normal Patient leans: [name]  Psychiatric Specialty Exam: Physical Exam  Constitutional: She appears well-developed.  HENT:  Head: Normocephalic.  Eyes: Pupils are equal, round, and reactive to light.  Neck: Normal range of motion.  Musculoskeletal: Normal range of motion.  Neurological: She is alert.  Skin: Skin is warm and dry.    Review of Systems  Psychiatric/Behavioral: Negative for depression.       States she is not here for any help  All other systems reviewed and are negative.   Blood pressure 101/61, pulse 75, temperature 98 F (36.7 C), temperature source Oral, resp. rate 18, height 5\' 3"  (1.6 m), weight 63.957 kg (141 lb).Body mass index is 24.98 kg/(m^2).  General Appearance: guarded  Eye Contact:: fair  Speech:normal rate    Volume: loud at times  Mood:angry, irritable  Affect: calm initially , but gets irritable during evaluation  Thought Process:linear  Orientation:states the month and the year  Thought Content:appears delusional  Suicidal Thoughts: Denies  Homicidal Thoughts:Denies  Memory:unable to assess- seems to be intact grossly - immediate -  fair , recent - fair  Judgement: Impaired  Insight: Lacking  Psychomotor Activity: normal  Concentration: Poor  Recall: Poor  Fund of Knowledge:Poor  Language: Poor  Akathisia: No  Handed:   AIMS (if indicated):    Assets:access to health care  ADL's: Impaired  Cognition: WNL          Sleep:  Number of Hours: 6.75    Current Medications: Current Facility-Administered Medications  Medication Dose Route Frequency Provider Last Rate Last Dose  . acetaminophen (TYLENOL) tablet 650 mg   650 mg Oral Q6H PRN Charm Rings, NP      . alum & mag hydroxide-simeth (MAALOX/MYLANTA) 200-200-20 MG/5ML suspension 30 mL  30 mL Oral Q4H PRN Charm Rings, NP      . carbamazepine (TEGRETOL) chewable tablet 300 mg  300 mg Oral BID PC Amando Ishikawa, MD   300 mg at 04/16/15 1714  . diphenhydrAMINE (BENADRYL) capsule 25 mg  25 mg Oral Q8H PRN Jomarie Longs, MD       Or  . diphenhydrAMINE (BENADRYL) injection 25 mg  25 mg Intramuscular Q8H PRN Zakyla Tonche, MD      . hydrOXYzine (ATARAX/VISTARIL) tablet 25 mg  25 mg Oral BID PC Charm Rings, NP   25 mg at 04/16/15 2130  . LORazepam (ATIVAN) tablet 1 mg  1 mg Oral Q8H PRN Jomarie Longs, MD       Or  . LORazepam (ATIVAN) injection 1 mg  1 mg Intramuscular Q8H PRN Lashya Passe, MD      . magnesium hydroxide (MILK OF MAGNESIA) suspension 30 mL  30 mL Oral Daily PRN Charm Rings, NP      . OLANZapine (ZYPREXA) tablet 5 mg  5 mg Oral Q6H PRN Jomarie Longs, MD       Or  . OLANZapine (ZYPREXA) injection 5 mg  5 mg Intramuscular Q6H PRN Jomarie Longs, MD      . Melene Muller ON 05/06/2015] paliperidone (INVEGA SUSTENNA) injection 156 mg  156 mg Intramuscular Once Samuel Rittenhouse, MD      . traZODone (DESYREL) tablet 50 mg  50 mg Oral QHS PRN Charm Rings, NP        Lab Results:  No results found for this or any previous visit (from the past 48 hour(s)).  Physical Findings: AIMS: Facial and Oral Movements Muscles of Facial Expression: None, normal Lips and Perioral Area: None, normal Jaw: None, normal Tongue: None, normal,Extremity Movements Upper (arms, wrists, hands, fingers): None, normal Lower (legs, knees, ankles, toes): None, normal, Trunk Movements Neck, shoulders, hips: None, normal, Overall Severity Severity of abnormal movements (highest score from questions above): None, normal Incapacitation due to abnormal movements: None, normal Patient's awareness of abnormal movements (rate only patient's report): No Awareness, Dental  Status Current problems with teeth and/or dentures?: No Does patient usually wear dentures?: No  CIWA:  CIWA-Ar Total: 3 COWS:  COWS Total Score: 2   Assessment: Pt is a 54 y old AAF who has a hx of schizophrenia , presented very disorganized with persecutory delusions as well as aggressive behavior.She is encouraged to take oral medications instead of injections, however she declines at this time. Currently on Forced medication order- will continue treatment.Sister is her legal guardian. Pt per sister with benefit from [facility] hospital and needs to be transferred as soon as bed available. However [facility] -she can only be taken if she takes po medications and is not on forced medications.Reviewed records from [place] -  INPATIENT DATES - 04/18/13- 05/08/13.Pt at that time was DC ed on Invega sustenna 234 mg IM q28 days .    Pt continues to refuse PO medications - appears to be delusional and paranoid and irritable . Will continue treatment.  Treatment Plan Summary: Daily contact with patient to assess and evaluate symptoms and progress in treatment and Medication management  Medications:  - Pt is on Forced medication order. -Will make Zyprexa 5 mg po/IM  q6 h prn for anxiety/agitation/psychosis. -Provided Invega Sustenna 234 mg IM X 1 Dose on 04/30/15 .Will give another dose of Invega Sustenna 156 mg IM in 5-7 days. Pt can be maintained on Invega Sustenna 117 mg IM q28 days . Pt to be referred for an out patient commitment of 180 days when stable to be discharged. In the mean time - observe pt on the unit. Encourage pt to take her PO medications. -Continue Tegretol 300 mg po bid for psychosis. But she has been refusing all PO medications. -Continue Trazodone 50 mg qhs prn insomnia CSW will work on disposition.  Medical Decision Making:  Review of Psycho-Social Stressors (1), Review of Last Therapy Session (1), Review of Medication Regimen & Side Effects (2) and Review of New Medication or Change in  Dosage (2)  Kashena Novitski, MD 05/02/2015 1:16 PM

## 2015-05-02 NOTE — Progress Notes (Addendum)
Recreation Therapy Notes  10.13.2016 @ approximately 2:30pm. Per MD order LRT met with patient to encourage engagement in milieu and reduce isolation on unit. LRT introduced herself and patient received LRT without issue. When LRT began to investigate patient leisure interest patient began speaking in incoherent sentences, LRT was only able to identify the words "God" and "sherriff." LRT attempted to continue to engage patient, patient refused to identify leisure interests, only identifying that she does what she needs to to maintained her household. When asked for examples, patient stated in hostile tone, "Just what I need to do." Due to patient increased agitation LRT informed patient she would return to see her the following day, patient not receptive, stating she would not be hospitalized tomorrow. LRT again let patient know she would be back the following day to work with her 1:1.   At this time patient walked by LRT to stand in front of window in her room, as patient passed LRT pungent odor of body odor followed patient. LRT informed patient RN patient needs encouragement to shower and maintain ADLs.   Laureen Ochs Durwin Davisson, LRT/CTRS  Lane Hacker 05/02/2015 3:33 PM

## 2015-05-03 NOTE — Progress Notes (Signed)
Patient ID: [name], female   DOB: 02/19/1961, 54 y.o.   MRN: [id number] D   ---   Pt. Refused her 1800 hr. Medications even after being encouraged to take the medications.  Pt. Continues to be responding to internal stimuli.

## 2015-05-03 NOTE — Tx Team (Signed)
Interdisciplinary Treatment Plan Update (Adult)  Date:  05/03/2015   Time Reviewed:  2:11 PM   Progress in Treatment: Attending groups: Yes Participating in groups:  Yes Taking medication as prescribed:  Yes. Tolerating medication:  Yes. Family/Significant other contact made:  Yes Patient understands diagnosis:  No  Limited insight Discussing patient identified problems/goals with staff:  Yes, see initial care plan. Medical problems stabilized or resolved:  Yes. Denies suicidal/homicidal ideation: Yes. Issues/concerns per patient self-inventory:  No. Other:  New problem(s) identified:  Discharge Plan or Barriers:  See below  Reason for Continuation of Hospitalization: Delusions  Medication stabilization Other; describe Paranoia  Comments:   [name] is a 54 y.o. female history of schizophrenia here presenting with agitation and aggressive behavior. The cable company was at her house earlier and she became aggressive towards her as well as towards her mother. She was called and she was IVC. On exam, patient refused to answer questions and feels that everyone is here to kill her and are evil. I was unable to examine her because she refused. She was given Ativan and Benadryl for agitation.  Second opinion delivered to force meds as pt was refusing.  She is now taking Haldol, Tegretol PO Estimated length of stay: 4-5 days  9/30:  Patient referred for transfer to [facility], IVC paperwork renewed.  Continues to have limited insight, experiencing AVH.  Is taking meds IM only, refuses PO  04/24/15:  Still taking meds IM only   Will re-refer to [facility] when taking meds PO 04/30/15  Continues IM only.  Less symptomatic.  Will return home next Tuesday after getting Outpt commitment for [facility] 06/03/15:  Linkage made w [facility] clinic, guardian aware of appointments,will be referred for outpt clinic next Tuesday, will receive second injection in hospital next Tuesday prior to  discharge.  CSW investigating back up plan for patient to receive [facility] if [facility] not [facility].   New goal(s):  Review of initial/current patient goals per problem list:   Review of initial/current patient goals per problem list:  1. Goal(s): Patient will participate in aftercare plan   Met: Yes   Target date: 3-5 days post admission date   As evidenced by: Patient will participate within aftercare plan AEB aftercare provider and housing plan at discharge being identified.  04/19/15 Working on referral to [facility] per request of guardian 04/24/15  [facility] of her unwillingness to take meds PO 04/30/15  [name] has an [place] Maintenance dose on board.  She will get one more prior to d/c.  She will return home with her family and follow up at [facility] on an outpt commitment 05/03/15:  Agreeable to monthly injectable medication, started on regimen that can continue outpatient.  Linked w [place] outpatient clinic.        5. Goal(s): Patient will demonstrate decreased signs of psychosis  * Met: No  * Target date: 3-5 days post admission date  * As evidenced by: Patient will demonstrate decreased frequency of AVH or return to baseline function 04/15/15   Presents with paranoia, disorganization, mood lability due to non compliance with meds, oupt tx 04/19/15:  Continues to appear to respond to internal stimuli, AH, irritable, labile mood 04/24/15:  Mood is more stable.  Other symptoms remain.  Pt continues to refuse meds PO; has been getting IM since day one. 04/30/15  Still refusing PO, so gets injection daily.   05/03/15:  Patient minimally interactive w  others, looks at floor, avoids eye contact w others.  Noted to be delusional, paranoid, irritable by MD.     Attendees: Patient:  05/03/2015 2:11 PM   Family:   05/03/2015 2:11 PM   Physician:  Ursula Alert, MD 05/03/2015 2:11 PM   Nursing:   Clair Gulling, RN 05/03/2015 2:11 PM   CSW:     Edwyna Shell, [place]   05/03/2015 2:11 PM   Other:  05/03/2015 2:11 PM   Other:   05/03/2015 2:11 PM   Other:  Lars Pinks, Nurse CM 05/03/2015 2:11 PM   Other:  Lucinda Dell, [place] Sessions TCT 05/03/2015 2:11 PM   Other:  Norberto Sorenson, [place]  05/03/2015 2:11 PM   Other:  05/03/2015 2:11 PM   Other:  05/03/2015 2:11 PM   Other:  05/03/2015 2:11 PM   Other:  05/03/2015 2:11 PM   Other:  05/03/2015 2:11 PM   Other:   05/03/2015 2:11 PM    Scribe for Treatment Team:   Edwyna Shell, LCSW Clinical Social Worker , 05/03/2015 2:11 PM

## 2015-05-03 NOTE — Clinical Social Work Note (Signed)
CSW spoke w [facility], says patient is not assigned to [facility] therefore needs to be reassigned to PCP.  CSW will continue contact w [facility] to attempt to schedule.  Patient has been in the [facility] system, not currently linked w any provider.    Santa GeneraAnne Flonnie Wierman, LCSW Clinical Social Worker

## 2015-05-03 NOTE — Progress Notes (Signed)
Patient ID: [name], female   DOB: December 28, 1960, 54 y.o.   MRN: [id number] D: Client stays in room this evening, denies SHI, AVH, appears preoccupied. Client says of her day "boring" "want to go home, flush out my urinary tract" Client reports not groups "tired of that too" Client reports no visitors "nope cause I might get the wrong one"  A: Writer provided emotional support, encouraged client to reports any concerns. Staff will monitor q5215min for safety. R: Client is safe on the unit, did not attend group.

## 2015-05-03 NOTE — Progress Notes (Signed)
[facility] Group Notes:  (Nursing/MHT/Case Management/Adjunct)  Date:  05/03/2015  Time:  9:10 PM  Type of Therapy:  Psychoeducational Skills  Participation Level:  Minimal  Participation Quality:  Appropriate  Affect:  Depressed and Flat  Cognitive:  Lacking  Insight:  Limited  Engagement in Group:  Limited  Modes of Intervention:  Education  Summary of Progress/Problems: The patient shared with the group that she had a "boring" day as a whole. The patient would not explain any further about her day except that she is simply "waiting to be discharged". As a theme for the day, her relapse prevention will involve eating better and taking things "one day at a time".   Winna Golla S 05/03/2015, 9:10 PM

## 2015-05-03 NOTE — [facility] Group Notes (Signed)
[facility] Note   05/03/2015 3:43 PM  Participation Quality:  Invited, did not attend.   Sallee Langeunningham, Blayton Huttner C

## 2015-05-03 NOTE — Progress Notes (Signed)
D  --   Pt. Denies pain or dis-comfort.  She appears to be responding to internal stimuli and has been observed in conversation with  Imaginary person.    Pt. Asks to  " speak with my [employer] representative to get me out of here".    Pt. Angrily refused to take her scheduled meds this AM.  She stays in her room away from peers.  LCSW worked with pt. This Am to get pt. To take a shower to reduce body odor issue.  Clean scrubs were provided.  Pt. Has poor eye contact and has aggressive tone in voice.   She did agreed to contract for safety.  Pt. Has attended no groups on unit unit this AM.   --- A -- support, meds provided.  --- R  --  Pt. Remains safe but labile on unit

## 2015-05-03 NOTE — Progress Notes (Signed)
Recreation Therapy Notes  10.14.2016 @ approximately 9:40am LRT returned to meet with patient in an effort to engage her in conversation and offer patient opportunity to leave unit with LRT. Upon approaching patient room patient was observed to be engaged in conversation, however no one was present in patient room. When LRT made her presence known patient stated "I'm just working out something with the government." As LRT entered patient room smell of body odor was extremely present. It smelled as if patient has not showered in many days. LRT encouraged patient to bathe, without resistance patient agreed to take a shower. Patient reported she needed soap and new scrubs. LRT agreed to get supplies needed for patient to shower. LRT returned with items patient requested and a shirt from the clothes closet on the adult unit. Patient accepted towels, wash clothes, soap, deodorant, lotion and scrubs, but refused to accept the tee-shirt LRT provided, stating that LRT had stolen the tee-shirt from her roommate and that she was attempting to have patient take stolen goods from her. LRT reassured patient shirt was not stolen, nor was it her roommates, however patient maintained that LRT had stolen shirt.   LRT returned approximately 5 minutes later to find patient in bathroom taking a shower. Patient denied need for anything additional from LRT.   Marykay Lexenise L Kruti Horacek, LRT/CTRS  Ghadeer Kastelic L 05/03/2015 10:27 AM

## 2015-05-03 NOTE — Plan of Care (Signed)
Problem: Alteration in thought process Goal: LTG-Patient has not harmed self or others in at least 2 days Outcome: Progressing Client is safe on the unit AEB q6515min safety checks,has not attempted harm to self since admission.

## 2015-05-03 NOTE — [facility] Group Notes (Signed)
[facility] LCSW Group Therapy  05/03/2015 1:50 PM   Type of Therapy:  Group Therapy  Participation Level:  Minimal  Participation Quality:  Appropriate, Attentive, Sharing and Supportive  Affect:  Flat  Cognitive:  Limited  Insight:  Developing/Improving  Engagement in Therapy:  Minimal  Modes of Intervention:  Discussion, Exploration, Socialization and Support  Summary of Progress/Problems:  Chaplain led group explored concept of hope and its relevance to mental health recovery.  Patients explored themes including what matters to them personally, how others responses are similar/different, and what they are hopeful for.  Group members discussed relevance of social supports, innter strength and using their own stories to craft a recovery path.  Patient sat in group, stared at floor, no interaction w others.  Sallee Langeunningham, Damisha Wolff C

## 2015-05-03 NOTE — Progress Notes (Signed)
Patient ID: [name], female   DOB: 1960/11/30, 54 y.o.   MRN: [id number] [facility] MD Progress Note  05/03/2015  [name]  MRN:  [id number]  Subjective:  [name] reports, "I am fine."   Objective: Pt seen and chart reviewed. I have also reviewed notes in EHR from previous days . Patient is a 19 y old AAF with hx of schizophrenia, brought via IVC for aggressive, disorganized behavior at home. Pt per initial notes - was aggressive with family, paranoid and delusional about cable guy who came to her house .   Pt today continues to be delusional and paranoid. She continues to avoid eye contact . She is irritable - continues to refuse PO medications. Pt needs encouragement to take care of her ADLs . Pt after a lot of encouragement today took a shower. Pt continues to require support and encouragement.  Principal Problem: Schizophrenia, paranoid (HCC)  Diagnosis:   Patient Active Problem List   Diagnosis Date Noted  . Noncompliance with diet and medication regimen [Z91.11, Z91.14]   . Psychoses [F29]   . Hypokalemic alkalosis [E87.3] 11/11/2011  . Medically noncompliant [Z91.19] 11/11/2011  . Schizophrenia, paranoid (HCC) [F20.0] 11/07/2011   Total Time spent with patient: 25 minutes  Past Medical History:  Past Medical History  Diagnosis Date  . Schizophrenia   . Non compliance w medication regimen     Family History: Pt did not express any hx of mental illness in family.  Social History:  History  Alcohol Use  . Yes    Comment: Refuses to disclose how much     History  Drug Use No    Comment: Refuses to answer    Social History   Social History  . Marital Status: Single    Spouse Name: [name]  . Number of Children: [name]  . Years of Education: [name]   Social History Main Topics  . Smoking status: Current Every Day Smoker  . Smokeless tobacco: None  . Alcohol Use: Yes     Comment: Refuses to disclose how much  . Drug Use: No     Comment: Refuses to answer  . Sexual  Activity: Not Asked     Comment: refused to answer   Other Topics Concern  . None   Social History Narrative   Additional History:    Sleep: Fair  Appetite:  Fair  Musculoskeletal: Strength & Muscle Tone: within normal limits Gait & Station: normal Patient leans: [name]  Psychiatric Specialty Exam: Physical Exam  Constitutional: She appears well-developed.  HENT:  Head: Normocephalic.  Eyes: Pupils are equal, round, and reactive to light.  Neck: Normal range of motion.  Musculoskeletal: Normal range of motion.  Neurological: She is alert.  Skin: Skin is warm and dry.    Review of Systems  Psychiatric/Behavioral: Negative for depression.       States she is not here for any help  All other systems reviewed and are negative.   Blood pressure 101/70, pulse 76, temperature 98 F (36.7 C), temperature source Oral, resp. rate 16, height 5\' 3"  (1.6 m), weight 63.957 kg (141 lb).Body mass index is 24.98 kg/(m^2).  General Appearance: guarded  Eye Contact:: fair  Speech:normal rate    Volume: loud at times  Mood:angry, irritable  Affect: calm initially , but gets irritable during evaluation  Thought Process:linear  Orientation:states the month and the year  Thought Content:appears delusional  Suicidal Thoughts: Denies  Homicidal Thoughts:Denies  Memory:unable to assess- seems to be intact grossly -  immediate - fair , recent - fair  Judgement: Impaired  Insight: Lacking  Psychomotor Activity: normal  Concentration: Poor  Recall: Poor  Fund of Knowledge:Poor  Language: Poor  Akathisia: No  Handed:   AIMS (if indicated):    Assets:access to health care  ADL's: Impaired  Cognition: WNL          Sleep:  Number of Hours: 6.75    Current Medications: Current Facility-Administered Medications  Medication Dose Route Frequency Provider Last Rate Last Dose  . acetaminophen (TYLENOL) tablet 650 mg  650 mg Oral Q6H PRN Charm Rings, NP       . alum & mag hydroxide-simeth (MAALOX/MYLANTA) 200-200-20 MG/5ML suspension 30 mL  30 mL Oral Q4H PRN Charm Rings, NP      . carbamazepine (TEGRETOL) chewable tablet 300 mg  300 mg Oral BID PC Mekiyah Gladwell, MD   300 mg at 04/16/15 1714  . diphenhydrAMINE (BENADRYL) capsule 25 mg  25 mg Oral Q8H PRN Jomarie Longs, MD       Or  . diphenhydrAMINE (BENADRYL) injection 25 mg  25 mg Intramuscular Q8H PRN Dineen Conradt, MD      . hydrOXYzine (ATARAX/VISTARIL) tablet 25 mg  25 mg Oral BID PC Charm Rings, NP   25 mg at 04/16/15 4098  . LORazepam (ATIVAN) tablet 1 mg  1 mg Oral Q8H PRN Jomarie Longs, MD       Or  . LORazepam (ATIVAN) injection 1 mg  1 mg Intramuscular Q8H PRN Franco Duley, MD      . magnesium hydroxide (MILK OF MAGNESIA) suspension 30 mL  30 mL Oral Daily PRN Charm Rings, NP      . OLANZapine (ZYPREXA) tablet 5 mg  5 mg Oral Q6H PRN Jomarie Longs, MD       Or  . OLANZapine (ZYPREXA) injection 5 mg  5 mg Intramuscular Q6H PRN Jomarie Longs, MD      . Melene Muller ON 05/06/2015] paliperidone (INVEGA SUSTENNA) injection 156 mg  156 mg Intramuscular Once Idonna Heeren, MD      . traZODone (DESYREL) tablet 50 mg  50 mg Oral QHS PRN Charm Rings, NP        Lab Results:  No results found for this or any previous visit (from the past 48 hour(s)).  Physical Findings: AIMS: Facial and Oral Movements Muscles of Facial Expression: None, normal Lips and Perioral Area: None, normal Jaw: None, normal Tongue: None, normal,Extremity Movements Upper (arms, wrists, hands, fingers): None, normal Lower (legs, knees, ankles, toes): None, normal, Trunk Movements Neck, shoulders, hips: None, normal, Overall Severity Severity of abnormal movements (highest score from questions above): None, normal Incapacitation due to abnormal movements: None, normal Patient's awareness of abnormal movements (rate only patient's report): No Awareness, Dental Status Current problems with teeth and/or  dentures?: No Does patient usually wear dentures?: No  CIWA:  CIWA-Ar Total: 3 COWS:  COWS Total Score: 2   Assessment: Pt is a 7 y old AAF who has a hx of schizophrenia , presented very disorganized with persecutory delusions as well as aggressive behavior.She is encouraged to take oral medications instead of injections, however she declines at this time. Currently on Forced medication order- will continue treatment.Sister is her legal guardian. Pt per sister with benefit from [facility] hospital and needs to be transferred as soon as bed available. However per [facility] -she can only be taken if she takes po medications and is not on forced medications.Reviewed records  from [place] - INPATIENT DATES - 04/18/13- 05/08/13.Pt at that time was DC ed on Invega sustenna 234 mg IM q28 days .    Pt continues to refuse PO medications - continues to be irritable, delusional  . Will continue treatment.  Treatment Plan Summary: Daily contact with patient to assess and evaluate symptoms and progress in treatment and Medication management  Medications:  - Pt is on Forced medication order. -Will make Zyprexa 5 mg po/IM  q6 h prn for anxiety/agitation/psychosis. -Provided Invega Sustenna 234 mg IM X 1 Dose on 04/30/15 .Will give another dose of Invega Sustenna 156 mg IM in 5-7 days. Pt can be maintained on Invega Sustenna 117 mg IM q28 days . Pt to be referred for an out patient commitment of 180 days when stable to be discharged. In the mean time - observe pt on the unit. Encourage pt to take her PO medications. -Continue Tegretol 300 mg po bid for psychosis. But she has been refusing all PO medications. -Continue Trazodone 50 mg qhs prn insomnia CSW will work on disposition.  Medical Decision Making:  Review of Psycho-Social Stressors (1), Review of Last Therapy Session (1) and Review of Medication Regimen & Side Effects (2)  Taelyn Nemes, MD 05/03/2015 12;50 PM

## 2015-05-04 NOTE — [facility] Group Notes (Signed)
[facility] Group Notes:  (Nursing/MHT/Case Management/Adjunct)  Date:  05/04/2015  Time:  2:44 PM  Type of Therapy:  Psychoeducational Skills  Participation Level:  Active  Participation Quality:  Appropriate  Affect:  Appropriate  Cognitive:  Appropriate  Insight:  Appropriate  Engagement in Group:  Engaged  Modes of Intervention:  Discussion  Summary of Progress/Problems: Pt did attend self inventory group.  [name] 05/04/2015, 2:44 PM 

## 2015-05-04 NOTE — Progress Notes (Signed)
The focus of this group is to help patients review their daily goal of treatment and discuss progress on daily workbooks. Pt did not attend the evening group. 

## 2015-05-04 NOTE — Progress Notes (Signed)
Patient ID: [name], female   DOB: 1960/12/09, 54 y.o.   MRN: [id number] Patient ID: [name], female   DOB: 1961-03-06, 54 y.o.   MRN: [id number] [facility] MD Progress Note  05/04/2015  [name]  MRN:  [id number]  Subjective:  [name] reports, "I am fine.  They kept me here.  I was just supposed to be there for detox.  I am here involuntary.  I am fine.  Ill see you later."  Objective: Pt seen and chart reviewed. I have also reviewed notes in EHR from previous days . Patient is a 54 y old AAF with hx of schizophrenia, brought via IVC for aggressive, disorganized behavior at home. Pt per initial notes - was aggressive with family, paranoid and delusional about cable guy who came to her house .   There has been no change.  Pt today continues to be delusional and paranoid. She continues to avoid eye contact . She is irritable - continues to refuse PO medications. Pt needs encouragement to take care of her ADLs . Pt after a lot of encouragement today took a shower. Pt continues to require support and encouragement.  Principal Problem: Schizophrenia, paranoid (HCC)  Diagnosis:   Patient Active Problem List   Diagnosis Date Noted  . Noncompliance with diet and medication regimen [Z91.11, Z91.14]   . Psychoses [F29]   . Hypokalemic alkalosis [E87.3] 11/11/2011  . Medically noncompliant [Z91.19] 11/11/2011  . Schizophrenia, paranoid (HCC) [F20.0] 11/07/2011   Total Time spent with patient: 25 minutes  Past Medical History:  Past Medical History  Diagnosis Date  . Schizophrenia   . Non compliance w medication regimen     Family History: Pt did not express any hx of mental illness in family.  Social History:  History  Alcohol Use  . Yes    Comment: Refuses to disclose how much     History  Drug Use No    Comment: Refuses to answer    Social History   Social History  . Marital Status: Single    Spouse Name: [name]  . Number of Children: [name]  . Years of Education: [name]   Social  History Main Topics  . Smoking status: Current Every Day Smoker  . Smokeless tobacco: None  . Alcohol Use: Yes     Comment: Refuses to disclose how much  . Drug Use: No     Comment: Refuses to answer  . Sexual Activity: Not Asked     Comment: refused to answer   Other Topics Concern  . None   Social History Narrative   Additional History:    Sleep: Fair  Appetite:  Fair  Musculoskeletal: Strength & Muscle Tone: within normal limits Gait & Station: normal Patient leans: [name]  Psychiatric Specialty Exam: Physical Exam  Vitals reviewed. Constitutional: She appears well-developed.  HENT:  Head: Normocephalic.  Eyes: Pupils are equal, round, and reactive to light.  Neck: Normal range of motion.  Musculoskeletal: Normal range of motion.  Neurological: She is alert.  Skin: Skin is warm and dry.    Review of Systems  Psychiatric/Behavioral: Negative for depression.       States she is not here for any help  All other systems reviewed and are negative.   Blood pressure 102/68, pulse 83, temperature 98.3 F (36.8 C), temperature source Oral, resp. rate 16, height  (1.6 m), weight 63.957 kg (141 lb).Body mass index is 24.98 kg/(m^2).  General Appearance: guarded  Eye Contact:: fair  Speech:normal  rate    Volume: loud at times  Mood:angry, irritable  Affect: calm initially , but gets irritable during evaluation  Thought Process:linear  Orientation:states the month and the year  Thought Content:appears delusional  Suicidal Thoughts: Denies  Homicidal Thoughts:Denies  Memory:unable to assess- seems to be intact grossly - immediate - fair , recent - fair  Judgement: Impaired  Insight: Lacking  Psychomotor Activity: normal  Concentration: Poor  Recall: Poor  Fund of Knowledge:Poor  Language: Poor  Akathisia: No  Handed:   AIMS (if indicated):    Assets:access to health care  ADL's: Impaired  Cognition: WNL          Sleep:   Number of Hours: 6.75    Current Medications: Current Facility-Administered Medications  Medication Dose Route Frequency Provider Last Rate Last Dose  . acetaminophen (TYLENOL) tablet 650 mg  650 mg Oral Q6H PRN Charm Rings, NP      . alum & mag hydroxide-simeth (MAALOX/MYLANTA) 200-200-20 MG/5ML suspension 30 mL  30 mL Oral Q4H PRN Charm Rings, NP      . carbamazepine (TEGRETOL) chewable tablet 300 mg  300 mg Oral BID PC Saramma Eappen, MD   300 mg at 04/16/15 1714  . diphenhydrAMINE (BENADRYL) capsule 25 mg  25 mg Oral Q8H PRN Jomarie Longs, MD       Or  . diphenhydrAMINE (BENADRYL) injection 25 mg  25 mg Intramuscular Q8H PRN Saramma Eappen, MD      . hydrOXYzine (ATARAX/VISTARIL) tablet 25 mg  25 mg Oral BID PC Charm Rings, NP   25 mg at 04/16/15 1610  . LORazepam (ATIVAN) tablet 1 mg  1 mg Oral Q8H PRN Jomarie Longs, MD       Or  . LORazepam (ATIVAN) injection 1 mg  1 mg Intramuscular Q8H PRN Saramma Eappen, MD      . magnesium hydroxide (MILK OF MAGNESIA) suspension 30 mL  30 mL Oral Daily PRN Charm Rings, NP      . OLANZapine (ZYPREXA) tablet 5 mg  5 mg Oral Q6H PRN Jomarie Longs, MD       Or  . OLANZapine (ZYPREXA) injection 5 mg  5 mg Intramuscular Q6H PRN Jomarie Longs, MD      . Melene Muller ON 05/06/2015] paliperidone (INVEGA SUSTENNA) injection 156 mg  156 mg Intramuscular Once Saramma Eappen, MD      . traZODone (DESYREL) tablet 50 mg  50 mg Oral QHS PRN Charm Rings, NP        Lab Results:  No results found for this or any previous visit (from the past 48 hour(s)).  Physical Findings: AIMS: Facial and Oral Movements Muscles of Facial Expression: None, normal Lips and Perioral Area: None, normal Jaw: None, normal Tongue: None, normal,Extremity Movements Upper (arms, wrists, hands, fingers): None, normal Lower (legs, knees, ankles, toes): None, normal, Trunk Movements Neck, shoulders, hips: None, normal, Overall Severity Severity of abnormal movements  (highest score from questions above): None, normal Incapacitation due to abnormal movements: None, normal Patient's awareness of abnormal movements (rate only patient's report): No Awareness, Dental Status Current problems with teeth and/or dentures?: No Does patient usually wear dentures?: No  CIWA:  CIWA-Ar Total: 3 COWS:  COWS Total Score: 2   Assessment: Pt is a 79 y old AAF who has a hx of schizophrenia , presented very disorganized with persecutory delusions as well as aggressive behavior.She is encouraged to take oral medications instead of injections, however she declines at this  time. Currently on Forced medication order- will continue treatment.Sister is her legal guardian. [facility] as [facility]. However [facility] -she [facility] only be [facility] if she takes po medications and is not on forced medications.[place] [facility] [place] - 04/18/13- 05/08/13.Pt at that time was DC ed on Invega sustenna 234 mg IM q28 days .    Pt continues to refuse PO medications and "I don't want anything they are offering." - continues to be irritable, delusional.  Will continue treatment.  Treatment Plan Summary: Daily contact with patient to assess and evaluate symptoms and progress in treatment and Medication management  Medications:  - Pt is on Forced medication order. -Will make Zyprexa 5 mg po/IM  q6 h prn for anxiety/agitation/psychosis. -Provided Invega Sustenna 234 mg IM X 1 Dose on 04/30/15 .Will give another dose of Invega Sustenna 156 mg IM in 5-7 days. Pt can be maintained on Invega Sustenna 117 mg IM q28 days . Pt to be referred for an out patient commitment of 180 days when stable to be discharged. In the mean time - observe pt on the unit. Encourage pt to take her PO medications. -Continue Tegretol 300 mg po bid for psychosis. But she has been refusing all PO medications. -Continue Trazodone 50 mg qhs prn  insomnia CSW will work on disposition.  Medical Decision Making:  Review of Psycho-Social Stressors (1), Review of Last Therapy Session (1) and Review of Medication Regimen & Side Effects (2)  [name], AGNP-BC 05/04/2015 12;50 PM I agree with assessment and plan Madie RenoIrving A. Dub MikesLugo, M.D.

## 2015-05-04 NOTE — Progress Notes (Addendum)
Patient ID: [name], female   DOB: 1960-12-15, 54 y.o.   MRN: [id number]   D: Pt has been very flat and depressed on the unit today, she has also been very isolative. Pt has refused all meds, and reported that she has been stuck enough and that she should be left alone. Pt reported that her depression was a 0, her hopelessness was a 0, and her anxiety was a 0.  Pt reported being negative SI/HI, no AH/VH noted. A: 15 min checks continued for patient safety. R: Pt safety maintained.

## 2015-05-04 NOTE — [facility] Group Notes (Signed)
[facility] Group Notes:  (Clinical Social Work)  05/04/2015  11:15-12:00PM  Summary of Progress/Problems:   The main focus of today's process group was to discuss patients' feelings related to being hospitalized.  It was agreed in general by the group that it would be preferable to avoid future hospitalizations, and we discussed means of doing that.  As a follow-up, problems with adhering to medication recommendations were discussed.  The patient refused to express her primary feeling about being hospitalized, stating "I'm just here."  Later when questioned again, she said she is bored.  She sat with arms crossed and no eye contact throughout group.  Type of Therapy:  Group Therapy - Process  Participation Level:  Minimal  Participation Quality:  Inattentive and Resistant  Affect:  Flat and Irritable  Cognitive:  [name]  Insight:  Poor  Engagement in Therapy:  Poor  Modes of Intervention:  Exploration, Discussion  Ambrose MantleMareida Grossman-Orr, LCSW 05/04/2015, 12:37 PM

## 2015-05-05 NOTE — Progress Notes (Signed)
Adult Psychoeducational Group Note  Date:  05/05/2015 Time:  8:24 PM  Group Topic/Focus:  Wrap-Up Group:   The focus of this group is to help patients review their daily goal of treatment and discuss progress on daily workbooks.  Participation Level:  Active  Participation Quality:  Appropriate  Affect:  Appropriate  Cognitive:  Appropriate  Insight: Appropriate  Engagement in Group:  Engaged  Modes of Intervention:  Discussion  Additional Comments: The patient expressed that she attended group.The patient also said that group was about music therapy.  Octavio Mannshigpen, Mysty Kielty Lee 05/05/2015, 8:24 PM

## 2015-05-05 NOTE — [facility] Group Notes (Signed)
[facility] Group Notes:  (Clinical Social Work)  05/05/2015  [facility] Group Notes:  (Clinical Social Work)  05/05/2015  11:00AM-12:00PM  Summary of Progress/Problems:  The main focus of today's process group was to listen to a variety of genres of music and to identify that different types of music provoke different responses.  The patient then was able to identify personally what was soothing for them, as well as energizing.  Handouts were used to record feelings evoked, as well as how patient can personally use this knowledge in sleep habits, with depression, and with other symptoms.  The patient expressed that none of the music made her have any feelings.  She was helpful to another patient during group.  Type of Therapy:  Music Therapy   Participation Level:  Active  Participation Quality:  Attentive   Affect:  Flat  Cognitive:  Oriented  Insight:  Improving  Engagement in Therapy:  Improving  Modes of Intervention:   Activity, Exploration  Ambrose MantleMareida Grossman-Orr, LCSW 05/05/2015

## 2015-05-05 NOTE — Progress Notes (Signed)
Patient ID: [name], female   DOB: 03/16/1961, 54 y.o.   MRN: [id number] D: Client stays in room this shift, reports "don't know the hold up in releasing me" "she took me off the syringes cause my arms couldn't take no more" "I'm not here for pills, I'm here for detox" "[facility] "there was six of us and five of them left" "ask the government to hold us for treatment" Client then began talking about chips being used to monitor. Unable to follow her train of thought. A: Writer encouraged client to speake to physician about discharge plans. Staff will monitor q7615min for safety. R: client is safe on the unit, did not attend group.

## 2015-05-05 NOTE — [facility] Group Notes (Signed)
[facility] Group Notes:  (Nursing/MHT/Case Management/Adjunct)  Date:  05/05/2015  Time:  1:26 PM  Type of Therapy:  Psychoeducational Skills  Participation Level:  Active  Participation Quality:  Appropriate  Affect:  Appropriate  Cognitive:  Appropriate  Insight:  Appropriate  Engagement in Group:  Engaged  Modes of Intervention:  Discussion  Summary of Progress/Problems: Pt did attend self inventory group, pt reported that she was negative SI/HI, no AH/VH noted. Pt rated her depression as a 0, her anxiety 0 and her helplessness/hopelessness as a 0.     Pt reported no issues or concerns.    Jacquelyne BalintForrest, Amnah Breuer Shanta 05/05/2015, 1:26 PM

## 2015-05-05 NOTE — Progress Notes (Signed)
[facility] MD Progress Note  05/05/2015  [name]  MRN:  [id number]  Subjective:  "I am fine.  They kept me here.  I was just supposed to be there for detox.  I am here involuntary.  I am fine.  Even if they to close the facility down."  Easily irritated.  Objective: Pt seen and chart reviewed. I have also reviewed notes in EHR from previous days . Patient is a 78 y old AAF with hx of schizophrenia, brought via IVC for aggressive, disorganized behavior at home. Pt per initial notes - was aggressive with family, paranoid and delusional about cable guy who came to her house .   Minimal change.  Pt today continues to be delusional and paranoid. She continues to avoid eye contact . She is irritable - continues to refuse PO medications. Pt needs encouragement to take care of her ADLs . Patient took a shower today. Pt continues to require support and encouragement.  Principal Problem: Schizophrenia, paranoid (HCC)  Diagnosis:   Patient Active Problem List   Diagnosis Date Noted  . Noncompliance with diet and medication regimen [Z91.11, Z91.14]   . Psychoses [F29]   . Hypokalemic alkalosis [E87.3] 11/11/2011  . Medically noncompliant [Z91.19] 11/11/2011  . Schizophrenia, paranoid (HCC) [F20.0] 11/07/2011   Total Time spent with patient: 25 minutes  Past Medical History:  Past Medical History  Diagnosis Date  . Schizophrenia   . Non compliance w medication regimen     Family History: Pt did not express any hx of mental illness in family.  Social History:  History  Alcohol Use  . Yes    Comment: Refuses to disclose how much     History  Drug Use No    Comment: Refuses to answer    Social History   Social History  . Marital Status: Single    Spouse Name: [name]  . Number of Children: [name]  . Years of Education: [name]   Social History Main Topics  . Smoking status: Current Every Day Smoker  . Smokeless tobacco: None  . Alcohol Use: Yes     Comment: Refuses to disclose how much   . Drug Use: No     Comment: Refuses to answer  . Sexual Activity: Not Asked     Comment: refused to answer   Other Topics Concern  . None   Social History Narrative   Additional History:    Sleep: Fair  Appetite:  Fair  Musculoskeletal: Strength & Muscle Tone: within normal limits Gait & Station: normal Patient leans: [name]  Psychiatric Specialty Exam: Physical Exam  Vitals reviewed. Constitutional: She appears well-developed.  HENT:  Head: Normocephalic.  Eyes: Pupils are equal, round, and reactive to light.  Neck: Normal range of motion.  Musculoskeletal: Normal range of motion.  Neurological: She is alert.  Skin: Skin is warm and dry.    Review of Systems  Psychiatric/Behavioral: Negative for depression.       States she is not here for any help  All other systems reviewed and are negative.   Blood pressure 106/59, pulse 83, temperature 97.9 F (36.6 C), temperature source Oral, resp. rate 18, height  (1.6 m), weight 63.957 kg (141 lb).Body mass index is 24.98 kg/(m^2).  General Appearance: guarded  Eye Contact:: fair  Speech:normal rate    Volume: loud at times  Mood:angry, irritable  Affect: calm initially , but gets irritable during evaluation  Thought Process:linear  Orientation:states the month and the year  Thought  Content:appears delusional  Suicidal Thoughts: Denies  Homicidal Thoughts:Denies  Memory:unable to assess- seems to be intact grossly - immediate - fair , recent - fair  Judgement: Impaired  Insight: Lacking  Psychomotor Activity: normal  Concentration: Poor  Recall: Poor  Fund of Knowledge:Poor  Language: Poor  Akathisia: No  Handed:   AIMS (if indicated):    Assets:access to health care  ADL's: Impaired  Cognition: WNL          Sleep:  Number of Hours: 6.75    Current Medications: Current Facility-Administered Medications  Medication Dose Route Frequency Provider Last Rate Last Dose  .  acetaminophen (TYLENOL) tablet 650 mg  650 mg Oral Q6H PRN Charm RingsJamison Y Lord, NP      . alum & mag hydroxide-simeth (MAALOX/MYLANTA) 200-200-20 MG/5ML suspension 30 mL  30 mL Oral Q4H PRN Charm RingsJamison Y Lord, NP      . carbamazepine (TEGRETOL) chewable tablet 300 mg  300 mg Oral BID PC Saramma Eappen, MD   300 mg at 04/16/15 1714  . diphenhydrAMINE (BENADRYL) capsule 25 mg  25 mg Oral Q8H PRN Jomarie LongsSaramma Eappen, MD       Or  . diphenhydrAMINE (BENADRYL) injection 25 mg  25 mg Intramuscular Q8H PRN Saramma Eappen, MD      . hydrOXYzine (ATARAX/VISTARIL) tablet 25 mg  25 mg Oral BID PC Charm RingsJamison Y Lord, NP   25 mg at 04/16/15 16100812  . LORazepam (ATIVAN) tablet 1 mg  1 mg Oral Q8H PRN Jomarie LongsSaramma Eappen, MD       Or  . LORazepam (ATIVAN) injection 1 mg  1 mg Intramuscular Q8H PRN Saramma Eappen, MD      . magnesium hydroxide (MILK OF MAGNESIA) suspension 30 mL  30 mL Oral Daily PRN Charm RingsJamison Y Lord, NP      . OLANZapine (ZYPREXA) tablet 5 mg  5 mg Oral Q6H PRN Jomarie LongsSaramma Eappen, MD       Or  . OLANZapine (ZYPREXA) injection 5 mg  5 mg Intramuscular Q6H PRN Jomarie LongsSaramma Eappen, MD      . Melene Muller[START ON 05/06/2015] paliperidone (INVEGA SUSTENNA) injection 156 mg  156 mg Intramuscular Once Saramma Eappen, MD      . traZODone (DESYREL) tablet 50 mg  50 mg Oral QHS PRN Charm RingsJamison Y Lord, NP        Lab Results:  No results found for this or any previous visit (from the past 48 hour(s)).  Physical Findings: AIMS: Facial and Oral Movements Muscles of Facial Expression: None, normal Lips and Perioral Area: None, normal Jaw: None, normal Tongue: None, normal,Extremity Movements Upper (arms, wrists, hands, fingers): None, normal Lower (legs, knees, ankles, toes): None, normal, Trunk Movements Neck, shoulders, hips: None, normal, Overall Severity Severity of abnormal movements (highest score from questions above): None, normal Incapacitation due to abnormal movements: None, normal Patient's awareness of abnormal movements (rate only  patient's report): No Awareness, Dental Status Current problems with teeth and/or dentures?: No Does patient usually wear dentures?: No  CIWA:  CIWA-Ar Total: 3 COWS:  COWS Total Score: 2   Assessment: Pt is a [age] y old AAF who has a hx of schizophrenia , presented very disorganized with persecutory delusions as well as aggressive behavior.She is encouraged to take oral medications instead of injections, however she declines at this time. Currently on Forced medication order- will continue treatment.Sister is her legal guardian. [facility] and needs to be [facility] as soon as [facility]. However [facility] -she can only be taken if she takes po medications and is not on forced medications.Reviewed records from [place] - INPATIENT DATES - 04/18/13- 05/08/13.Pt at that time was DC ed on Invega sustenna 234 mg IM q28 days .    Pt continues to refuse PO medications and "I don't want anything they are offering." - continues to be irritable, delusional.  Will continue treatment.  Treatment Plan Summary: Daily contact with patient to assess and evaluate symptoms and progress in treatment and Medication management  Medications:  - Pt is on Forced medication order. -Will make Zyprexa 5 mg po/IM  q6 h prn for anxiety/agitation/psychosis. -Provided Invega Sustenna 234 mg IM X 1 Dose on 04/30/15 .Will give another dose of Invega Sustenna 156 mg IM in 5-7 days. Pt can be maintained on Invega Sustenna 117 mg IM q28 days . Pt to be referred for an out patient commitment of 180 days when stable to be discharged. In the mean time - observe pt on the unit. Encourage pt to take her PO medications. -Continue Tegretol 300 mg po bid for psychosis. But she has been refusing all PO medications. -Continue Trazodone 50 mg qhs prn insomnia CSW will work on disposition.  Medical Decision Making:  Review of Psycho-Social Stressors (1), Review of Last Therapy Session (1) and Review of  Medication Regimen & Side Effects (2)  Velna Hatchet May Agustin, AGNP-BC 05/05/2015 12;50 PM  I agree with assessment and plan [name] A. Dub Mikes, M.D.

## 2015-05-05 NOTE — Progress Notes (Signed)
Patient ID: [name], female   DOB: 01/04/1961, 54 y.o.   MRN: [id number]   D: Pt has been very flat and depressed on the unit today, she has also been very isolative. Pt has refused all meds, and reported that she has been stuck enough and that she should be left alone. Pt reported that her depression was a 0, her hopelessness was a 0, and her anxiety was a 0.  Pt reported being negative SI/HI, no AH/VH noted. A: 15 min checks continued for patient safety. R: Pt safety maintained.   

## 2015-05-05 NOTE — Progress Notes (Signed)
D: Pt isolates to room during shift. Pt easily agitated, pt answers questions, but very minimally.     A: Pt was offered support and encouragement.Pt was encourage to attend groups. Q 15 minute checks were done for safety.   R: Pt has no complaints.Pt receptive to treatment and safety maintained on unit.

## 2015-05-06 MED ORDER — PALIPERIDONE PALMITATE 156 MG/ML IM SUSP: 156.0000 mg | INTRAMUSCULAR | Status: DC

## 2015-05-06 NOTE — Progress Notes (Signed)
D:Pt refused PO medications. Pt heard talking in her room. When writer looked in her room she was sitting in her room with her mouth moving and no one else was in the room.  When writer asked pt about si, she responded "government ended that." Pt went on to say "I just want to be released." A:Offered support and 15 minute checks. Gave IM Invega as ordered. Pt allowed injection to be given with out hesitation or refusing. R:Safety maintained on the unit.

## 2015-05-06 NOTE — [facility] Group Notes (Signed)
[facility] LCSW Group Therapy 05/06/2015  1:15 pm  Type of Therapy: Group Therapy Participation Level: Minimal  Participation Quality: Attentive  Affect: Depressed and Flat  Cognitive: Alert and Oriented  Insight: Developing/Improving and Engaged  Engagement in Therapy: Developing/Improving and Engaged  Modes of Intervention: Clarification, Confrontation, Discussion, Education, Exploration,  Limit-setting, Orientation, Problem-solving, Rapport Building, Dance movement psychotherapisteality Testing, Socialization and Support  Summary of Progress/Problems: Pt identified obstacles faced currently and processed barriers involved in overcoming these obstacles. Pt identified steps necessary for overcoming these obstacles and explored motivation (internal and external) for facing these difficulties head on. Pt further identified one area of concern in their lives and chose a goal to focus on for today. Patient was observed listening during conversation but declined to participate despite CSW encouragement.   Samuella BruinKristin Toriano Aikey, MSW, [employer]CSWA Clinical Social Worker [facility] [phone]

## 2015-05-06 NOTE — Progress Notes (Signed)
[facility] MD Progress Note  05/06/2015  [name]  MRN:  [id number]  Subjective:  "I am fine. When can I go home ?"    Objective: Pt seen and chart reviewed. I have also reviewed notes in EHR from previous days . Patient is a 89 y old AAF with hx of schizophrenia, brought via IVC for aggressive, disorganized behavior at home. Pt per initial notes - was aggressive with family, paranoid and delusional about cable guy who came to her house .   Pt continues to be delusional, is calm during the initial part of evaluation , but gets agitated when writer starts talking about medications. Pt continues to believe she has no mental illness. Pt continues to need  encouragement to take care of her ADLs . Pt continues to require support and encouragement.  Principal Problem: Schizophrenia, paranoid (HCC)  Diagnosis:   Patient Active Problem List   Diagnosis Date Noted  . Noncompliance with diet and medication regimen [Z91.11, Z91.14]   . Psychoses [F29]   . Hypokalemic alkalosis [E87.3] 11/11/2011  . Medically noncompliant [Z91.19] 11/11/2011  . Schizophrenia, paranoid (HCC) [F20.0] 11/07/2011   Total Time spent with patient: 25 minutes  Past Medical History:  Past Medical History  Diagnosis Date  . Schizophrenia   . Non compliance w medication regimen     Family History: Pt did not express any hx of mental illness in family.  Social History:  History  Alcohol Use  . Yes    Comment: Refuses to disclose how much     History  Drug Use No    Comment: Refuses to answer    Social History   Social History  . Marital Status: Single    Spouse Name: [name]  . Number of Children: [name]  . Years of Education: [name]   Social History Main Topics  . Smoking status: Current Every Day Smoker  . Smokeless tobacco: None  . Alcohol Use: Yes     Comment: Refuses to disclose how much  . Drug Use: No     Comment: Refuses to answer  . Sexual Activity: Not Asked     Comment: refused to answer   Other  Topics Concern  . None   Social History Narrative   Additional History:    Sleep: Fair  Appetite:  Fair  Musculoskeletal: Strength & Muscle Tone: within normal limits Gait & Station: normal Patient leans: [name]  Psychiatric Specialty Exam: Physical Exam  Vitals reviewed. Constitutional: She appears well-developed.  HENT:  Head: Normocephalic.  Eyes: Pupils are equal, round, and reactive to light.  Neck: Normal range of motion.  Musculoskeletal: Normal range of motion.  Neurological: She is alert.  Skin: Skin is warm and dry.    Review of Systems  Psychiatric/Behavioral: Negative for depression.       States she is not here for any help  All other systems reviewed and are negative.   Blood pressure 100/62, pulse 74, temperature 97.1 F (36.2 C), temperature source Oral, resp. rate 18, height  (1.6 m), weight 63.957 kg (141 lb).Body mass index is 24.98 kg/(m^2).  General Appearance: guarded  Eye Contact:: fair  Speech:normal rate    Volume: loud at times  Mood:states she is fine , then gets angry and loud  Affect: calm initially , but gets irritable during evaluation  Thought Process:linear  Orientation:states the month and the year  Thought Content:coherent  Suicidal Thoughts: Denies  Homicidal Thoughts:Denies  Memory:unable to assess- seems to be intact grossly -  immediate - fair , recent - fair  Judgement: Impaired  Insight: Lacking  Psychomotor Activity: normal  Concentration:fair  Recall: fair  Fund of Knowledge:Poor  Language: fair  Akathisia: No  Handed:   AIMS (if indicated):    Assets:access to health care  ADL's: Impaired  Cognition: WNL          Sleep:  Number of Hours: 6.75    Current Medications: Current Facility-Administered Medications  Medication Dose Route Frequency Provider Last Rate Last Dose  . acetaminophen (TYLENOL) tablet 650 mg  650 mg Oral Q6H PRN Charm Rings, NP      . alum & mag  hydroxide-simeth (MAALOX/MYLANTA) 200-200-20 MG/5ML suspension 30 mL  30 mL Oral Q4H PRN Charm Rings, NP      . carbamazepine (TEGRETOL) chewable tablet 300 mg  300 mg Oral BID PC Quanasia Defino, MD   300 mg at 04/16/15 1714  . diphenhydrAMINE (BENADRYL) capsule 25 mg  25 mg Oral Q8H PRN Jomarie Longs, MD       Or  . diphenhydrAMINE (BENADRYL) injection 25 mg  25 mg Intramuscular Q8H PRN Joyclyn Plazola, MD      . hydrOXYzine (ATARAX/VISTARIL) tablet 25 mg  25 mg Oral BID PC Charm Rings, NP   25 mg at 04/16/15 6578  . LORazepam (ATIVAN) tablet 1 mg  1 mg Oral Q8H PRN Jomarie Longs, MD       Or  . LORazepam (ATIVAN) injection 1 mg  1 mg Intramuscular Q8H PRN Ziomara Birenbaum, MD      . magnesium hydroxide (MILK OF MAGNESIA) suspension 30 mL  30 mL Oral Daily PRN Charm Rings, NP      . OLANZapine (ZYPREXA) tablet 5 mg  5 mg Oral Q6H PRN Jomarie Longs, MD       Or  . OLANZapine (ZYPREXA) injection 5 mg  5 mg Intramuscular Q6H PRN Jomarie Longs, MD      . Melene Muller ON 06/06/2015] paliperidone (INVEGA SUSTENNA) injection 156 mg  156 mg Intramuscular Q30 days Jomarie Longs, MD      . traZODone (DESYREL) tablet 50 mg  50 mg Oral QHS PRN Charm Rings, NP        Lab Results:  No results found for this or any previous visit (from the past 48 hour(s)).  Physical Findings: AIMS: Facial and Oral Movements Muscles of Facial Expression: None, normal Lips and Perioral Area: None, normal Jaw: None, normal Tongue: None, normal,Extremity Movements Upper (arms, wrists, hands, fingers): None, normal Lower (legs, knees, ankles, toes): None, normal, Trunk Movements Neck, shoulders, hips: None, normal, Overall Severity Severity of abnormal movements (highest score from questions above): None, normal Incapacitation due to abnormal movements: None, normal Patient's awareness of abnormal movements (rate only patient's report): No Awareness, Dental Status Current problems with teeth and/or dentures?:  No Does patient usually wear dentures?: No  CIWA:  CIWA-Ar Total: 3 COWS:  COWS Total Score: 2   Assessment: Pt is a 89 y old AAF who has a hx of schizophrenia , presented very disorganized with persecutory delusions as well as aggressive behavior.She is encouraged to take oral medications instead of injections, however she declines at this time. Currently on Forced medication order- will continue treatment.Sister is her legal guardian. Pt per sister with benefit [facility] hospital and needs to be transferred as soon as bed available. However per [facility] -she can only be taken if she takes po medications and is not on forced medications.[place]  [facility]/18/13- 05/08/13.Pt at that time was DC ed on Invega sustenna 234 mg IM q28 days .    Pt continues to refuse PO medications and "I don't want anything they are offering." - continues to be irritable, delusional.  Will continue treatment.  Treatment Plan Summary: Daily contact with patient to assess and evaluate symptoms and progress in treatment and Medication management  Medications:  - Pt is on Forced medication order. -Will make Zyprexa 5 mg po/IM  q6 h prn for anxiety/agitation/psychosis. -Provided Invega Sustenna 234 mg IM X 1 Dose on 04/30/15 . Invega Sustenna 156 mg IM on 05/06/15. Next dose in 28 days. Pt can be maintained on Invega Sustenna 117 mg IM q28 days . Pt to be referred for an out patient commitment of 180 days when stable to be discharged. In the mean time - observe pt on the unit. Encourage pt to take her PO medications. -Continue Tegretol 300 mg po bid for psychosis. But she has been refusing all PO medications. -Continue Trazodone 50 mg qhs prn insomnia CSW will work on disposition.  Medical Decision Making:  Review of Psycho-Social Stressors (1), Review of Last Therapy Session (1), Review of Medication Regimen & Side Effects (2) and Review of New Medication or Change in Dosage  (2)  Garrus Gauthreaux, MD 05/06/2015 2:22 PM

## 2015-05-06 NOTE — Progress Notes (Signed)
Patient ID: [name], female   DOB: 06/22/1961, 54 y.o.   MRN: [id number] PER STATE REGULATIONS 482.30  THIS CHART WAS REVIEWED FOR MEDICAL NECESSITY WITH RESPECT TO THE PATIENT'S ADMISSION/ DURATION OF STAY.  NEXT REVIEW DATE:   05/10/2015  Willa RoughJENNIFER JONES Rylee Huestis, RN, BSN CASE MANAGER'

## 2015-05-06 NOTE — [facility] Group Notes (Signed)
   [facility]  05/06/2015  8:45 AM   Participation Quality: Alert, Appropriate and Oriented  Mood/Affect: Depressed and Flat  Depression Rating: Denies depression  Anxiety Rating: Denies anxiety   Thoughts of Suicide: Pt denies SI/HI  Will you contract for safety? Yes  Current AVH: Pt denies  Plan for Discharge/Comments: Pt attended discharge planning group and actively participated in group. CSW provided pt with today's workbook. Patient plans to return home with sister to [facility].  Transportation Means: Pt reports access to transportation  Supports: No supports mentioned at this time  Samuella BruinKristin Dorthy Hustead, MSW, [employer]CSWA Clinical Social Worker [facility] [phone]

## 2015-05-06 NOTE — Clinical Social Work Note (Signed)
CSW left voicemail for patient's sister [name] [phone]. Awaiting return call.  Samuella BruinKristin Rowin Bayron, MSW, [employer]CSWA Clinical Social Worker [facility] [phone]

## 2015-05-07 MED ORDER — PALIPERIDONE PALMITATE 156 MG/ML IM SUSP: 156.0000 mg | INTRAMUSCULAR | Status: DC

## 2015-05-07 MED ORDER — CARBAMAZEPINE 100 MG PO CHEW: 300.0000 mg | CHEWABLE_TABLET | Freq: Two times a day (BID) | ORAL | Status: DC

## 2015-05-07 NOTE — Progress Notes (Signed)
Patient ID: [name], female   DOB: 02-02-1961, 54 y.o.   MRN: [id number]  Pt currently presents with a flat affect and guarded behavior. Per self inventory, pt rates depression, hopelessness and anxiety at a 0. Pt reports good sleep, a good appetite, normal energy and good concentration. Pt is cooperative but becomes irritable when discussing medications. Pt refuses all medications today. Pt provided with medications per providers orders. Pt's labs and vitals were monitored throughout the day. Pt supported emotionally and encouraged to express concerns and questions. Pt educated on medications. Pt's safety ensured with 15 minute and environmental checks. Pt currently denies SI/HI and A/V hallucinations. Pt verbally agrees to seek staff if SI/HI or A/VH occurs and to consult with staff before acting on these thoughts. Pt to be discharged today per MD. Will continue POC.

## 2015-05-07 NOTE — Progress Notes (Signed)
  [facility] :  Will you be returning to the same living situation after discharge:  Yes,  patient plans to return home with sister At discharge, do you have transportation home?: Yes,  patient's sister will provide transportation Do you have the ability to pay for your medications: Yes,  patient will be provided with prescriptions at discharge  Release of information consent forms completed and in the chart;  Patient's signature needed at discharge.  Patient to Follow up at: Follow-up Information    [facility].   Why:  [facility] at [facility] AM, follow up w psychiatrist on 07/09/15 at 1 PM. VA scheduler is Mr Elige RadonBradley at [phone]   Contact information:   [address], [place], [place] [address] Phone:  [phone]  Fax # requested for mental health clinic, not received from provider      Patient denies SI/HI: Yes,  denies    Safety Planning and Suicide Prevention discussed: Yes,  with patient and sister  Have you used any form of tobacco in the last 30 days? (Cigarettes, Smokeless Tobacco, Cigars, and/or Pipes): Patient Refused Screening  Has patient been referred to the Quitline?: [name] patient is not a smoker  Gabino Hagin, Belenda CruiseKristin L 05/07/2015, 10:33 AM

## 2015-05-07 NOTE — Progress Notes (Signed)
Patient ID: [name], female   DOB: [birth year], 54 y.o.   MRN: [id number] Adult Psychoeducational Group Note  Date:  05/07/2015 Time: 09:35am  Group Topic/Focus:  Recovery Goals:   The focus of this group is to identify appropriate goals for recovery and establish a plan to achieve them.  Participation Level:  Minimal  Participation Quality:  Attentive  Affect:  Flat  Cognitive:  Alert and Oriented  Insight: Lacking  Engagement in Group:  Lacking  Modes of Intervention:  Activity, Education and Support  Additional Comments:  Pt attended group but declined to participate in introductions and activities.   Aurora Maskwyman, Angelino Rumery E 05/07/2015, 10:59 AM

## 2015-05-07 NOTE — BHH Suicide Risk Assessment (Signed)
[facility] Discharge Suicide Risk Assessment   Demographic Factors:  Single  Total Time spent with patient: 30 minutes  Musculoskeletal: Strength & Muscle Tone: within normal limits Gait & Station: normal Patient leans: [name]  Psychiatric Specialty Exam: Physical Exam  Review of Systems  Psychiatric/Behavioral: Negative for depression, suicidal ideas, hallucinations and substance abuse. The patient is not nervous/anxious and does not have insomnia.   All other systems reviewed and are negative.   Blood pressure 103/87, pulse 93, temperature 97.8 F (36.6 C), temperature source Oral, resp. rate 18, height  (1.6 m), weight 63.957 kg (141 lb).Body mass index is 24.98 kg/(m^2).  General Appearance: Casual  Eye Contact::  Fair  Speech:  Normal Rate409  Volume:  Normal  Mood:  Euthymic  Affect:  Congruent  Thought Process:  does make irrelevant comments at times- but improved since admission, appears to be very calm today  Orientation:  Full (Time, Place, and Person)  Thought Content:  has chronic delusions that government is after her. Pt also seen as talking to slef in her room when no one is watching. However she denies AH/VH/Paranoia  Suicidal Thoughts:  No  Homicidal Thoughts:  No  Memory:  Immediate;   Fair Recent;   Fair Remote;   Poor  Judgement:  Impaired  Insight:  Shallow  Psychomotor Activity:  Normal  Concentration:  Fair  Recall:  [employer] of Knowledge:Poor  Language: Fair  Akathisia:  No  Handed:  Right  AIMS (if indicated):     Assets:  Social Support  Sleep:  Number of Hours: 6.75  Cognition: WNL  ADL's:  Intact with encouragement   Have you used any form of tobacco in the last 30 days? (Cigarettes, Smokeless Tobacco, Cigars, and/or Pipes): Patient Refused Screening  Has this patient used any form of tobacco in the last 30 days? (Cigarettes, Smokeless Tobacco, Cigars, and/or Pipes) Yes, A prescription for an FDA-approved tobacco cessation medication was  offered at discharge and the patient refused  Mental Status Per Nursing Assessment::   On Admission:  NA (Pt would not answer)  Current Mental Status by Physician: Patient today was participate fully in the evaluation process, appears calm , alert , ox3, denied SI/HI/AH/VH, does continue to have underlying delusions , makes bizarre statements at times, but overall improved since admission.  Loss Factors: NA  Historical Factors: Impulsivity  Risk Reduction Factors:   Positive social support  Continued Clinical Symptoms:  Previous Psychiatric Diagnoses and Treatments  Cognitive Features That Contribute To Risk:  Closed-mindedness    Suicide Risk:  Minimal: No identifiable suicidal ideation.  Patients presenting with no risk factors but with morbid ruminations; may be classified as minimal risk based on the severity of the depressive symptoms  Principal Problem: Schizophrenia, paranoid [facility]) Discharge Diagnoses:  Patient Active Problem List   Diagnosis Date Noted  . Noncompliance with diet and medication regimen [Z91.11, Z91.14]   . Psychoses [F29]   . Hypokalemic alkalosis [E87.3] 11/11/2011  . Medically noncompliant [Z91.19] 11/11/2011  . Schizophrenia, paranoid (HCC) [F20.0] 11/07/2011    Follow-up Information    Follow up with [facility] On 05/20/2015.   Why:  Initial appointment to establish care at [facility] on 10/31 at 8 AM, follow up w psychiatrist on 07/09/15 at 1 PM. [facility] scheduler is Mr Elige Radon at [phone]   Contact information:   [address] [place], [place] [address] Phone:  [phone]  Fax # requested for mental health clinic, not  received from provider      Plan Of Care/Follow-up recommendations:  Activity:  no restrictions Diet:  regular Tests:  none Other:  Invega Sustenna IM 156 MG Q 30 DAYS - Next dose on 06/06/15.  Is patient on multiple antipsychotic therapies at discharge:  No   Has Patient had  three or more failed trials of antipsychotic monotherapy by history:  No  Recommended Plan for Multiple Antipsychotic Therapies: NA    Iyani Dresner MD 05/07/2015, 9:34 AM

## 2015-05-07 NOTE — BHH Suicide Risk Assessment (Signed)
B[facility] INPATIENT:  Family/Significant Other Suicide Prevention Education  Suicide Prevention Education:  Education Completed; sister Mady HaagensenOsiris Lennon [phone],  (name of family member/significant other) has been identified by the patient as the family member/significant other with whom the patient will be residing, and identified as the person(s) who will aid the patient in the event of a mental health crisis (suicidal ideations/suicide attempt).  With written consent from the patient, the family member/significant other has been provided the following suicide prevention education, prior to the and/or following the discharge of the patient.  The suicide prevention education provided includes the following:  Suicide risk factors  Suicide prevention and interventions  National Suicide Hotline telephone number  [facility] assessment telephone number  [facility] Emergency Assistance 911  [facility] and/or Residential Mobile Crisis Unit telephone number  Request made of family/significant other to:  Remove weapons (e.g., guns, rifles, knives), all items previously/currently identified as safety concern.    Remove drugs/medications (over-the-counter, prescriptions, illicit drugs), all items previously/currently identified as a safety concern.  The family member/significant other verbalizes understanding of the suicide prevention education information provided.  The family member/significant other agrees to remove the items of safety concern listed above.  Joeseph Verville, [place] CarboKristin L 05/07/2015, 10:24 AM

## 2015-05-07 NOTE — Tx Team (Signed)
Interdisciplinary Treatment Plan Update (Adult)  Date:  05/07/2015   Time Reviewed:  10:25 AM   Progress in Treatment: Attending groups: Yes Participating in groups:  minimally Taking medication as prescribed:  Yes. Tolerating medication:  Yes. Family/Significant other contact made:  Yes, CSW has spoken with patient's sister/guardian Patient understands diagnosis:  No  Limited insight Discussing patient identified problems/goals with staff:  Yes, see initial care plan. Medical problems stabilized or resolved:  Yes. Denies suicidal/homicidal ideation: Yes. Issues/concerns per patient self-inventory:  No. Other:  New problem(s) identified:  Discharge Plan or Barriers:  See below  Reason for Continuation of Hospitalization: Delusions  Medication stabilization Other; describe Paranoia  Comments:   [name] is a 54 y.o. female history of schizophrenia here presenting with agitation and aggressive behavior. The cable company was at her house earlier and she became aggressive towards her as well as towards her mother. She was called and she was IVC. On exam, patient refused to answer questions and feels that everyone is here to kill her and are evil. I was unable to examine her because she refused. She was given Ativan and Benadryl for agitation.  Second opinion delivered to force meds as pt was refusing.  She is now taking Haldol, Tegretol PO Estimated length of stay: 4-5 days  9/30:  Patient referred for transfer to [facility], IVC paperwork renewed.  Continues to have limited insight, experiencing AVH.  Is taking meds IM only, refuses PO  04/24/15:  Still taking meds IM only   Will re-refer to [facility] when taking meds PO 04/30/15  Continues IM only.  Less symptomatic.  Will return home next Tuesday after getting Outpt commitment for [facility] 05/03/15:  Linkage made w [facility], guardian aware of appointments,will be referred for outpt clinic next Tuesday, will  receive second injection in hospital next Tuesday prior to discharge.  CSW investigating back up plan for patient to receive injection if [facility] will not provide.  10/18: Patient plans to return home with sister at discharge to follow up with [facility].   New goal(s):  Review of initial/current patient goals per problem list:   Review of initial/current patient goals per problem list:  1. Goal(s): Patient will participate in aftercare plan   Met: Yes   Target date: 3-5 days post admission date   As evidenced by: Patient will participate within aftercare plan AEB aftercare provider and housing plan at discharge being identified.  04/19/15 Working on referral to [facility] per request of guardian 04/24/15  [facility] rejected [facility] on grounds of her unwillingness to take meds PO 04/30/15  [name] has an [place] Maintenance dose on board.  She will get one more prior to d/c.  She will return home with her family and follow up at [facility] on an outpt commitment 05/03/15:  Agreeable to monthly injectable medication, started on regimen that can continue outpatient.  Linked w [facility] outpatient clinic.      5. Goal(s): Patient will demonstrate decreased signs of psychosis  * Met: Adequate for discharge per MD  * Target date: 3-5 days post admission date  * As evidenced by: Patient will demonstrate decreased frequency of AVH or return to baseline function 04/15/15   Presents with paranoia, disorganization, mood lability due to non compliance with meds, oupt tx 04/19/15:  Continues to appear to respond to internal stimuli, AH, irritable, labile mood 04/24/15:  Mood is more stable.  Other symptoms remain.  Pt continues to refuse meds  PO; has been getting IM since day one. 04/30/15  Still refusing PO, so gets injection daily.   05/03/15:  Patient minimally interactive w others, looks at floor, avoids eye contact w others.  Noted to be delusional, paranoid, irritable by MD. 10/18:  Adequate for discharge per MD. Patient is at baseline.  Attendees: Patient:  05/07/2015 9:52 AM   Family:  05/07/2015 9:52 AM   Physician: Ursula Alert, MD 05/07/2015 9:52 AM   Nursing: Renee Rival RN 05/07/2015 9:52 AM   CSW: Erasmo Downer Glendene Wyer, [place]  05/07/2015 9:52 AM   Other:  05/07/2015 9:52 AM   Other:  05/07/2015 9:52 AM   Other: Lars Pinks, Nurse CM 05/07/2015 9:52 AM   Other: Lucinda Dell, Beverly Sessions TCT 05/07/2015 9:52 AM   Other:  05/07/2015 9:52 AM   Other:  05/07/2015 9:52 AM   Other:  05/07/2015 9:52 AM   Other:           Scribe for Treatment Team:   Tilden Fossa, MSW, [facility] Clinical Social Worker [facility] [phone]

## 2015-05-07 NOTE — Discharge Summary (Signed)
Physician Discharge Summary Note  Patient:  [name] is an 54 y.o., female MRN:  [id number] DOB:  Apr 03, 1961 Patient phone:  [phone] (home)  Patient address:   [address] [place] [address],  Total Time spent with patient: 30 minutes  Date of Admission:  04/13/2015 Date of Discharge: 05/07/2015  Reason for Admission:  psychosis Principal Problem: Schizophrenia, paranoid [facility]) Discharge Diagnoses: Patient Active Problem List   Diagnosis Date Noted  . Noncompliance with diet and medication regimen [Z91.11, Z91.14]   . Psychoses [F29]   . Hypokalemic alkalosis [E87.3] 11/11/2011  . Medically noncompliant [Z91.19] 11/11/2011  . Schizophrenia, paranoid (HCC) [F20.0] 11/07/2011    Musculoskeletal: Strength & Muscle Tone: within normal limits Gait & Station: normal Patient leans: [name]  Psychiatric Specialty Exam:  SEE MD SRA Physical Exam  ROS  Blood pressure 103/87, pulse 93, temperature 97.8 F (36.6 C), temperature source Oral, resp. rate 18, height 5\' 3"  (1.6 m), weight 63.957 kg (141 lb).Body mass index is 24.98 kg/(m^2).  Have you used any form of tobacco in the last 30 days? (Cigarettes, Smokeless Tobacco, Cigars, and/or Pipes): Patient Refused Screening  Has this patient used any form of tobacco in the last 30 days? (Cigarettes, Smokeless Tobacco, Cigars, and/or Pipes) [name]  Past Medical History:  Past Medical History  Diagnosis Date  . Schizophrenia   . Non compliance w medication regimen    History reviewed. No pertinent past surgical history. Family History: History reviewed. No pertinent family history. Social History:  History  Alcohol Use  . Yes    Comment: Refuses to disclose how much     History  Drug Use No    Comment: Refuses to answer    Social History   Social History  . Marital Status: Single    Spouse Name: [name]  . Number of Children: [name]  . Years of Education: [name]   Social History Main Topics  . Smoking status: Current  Every Day Smoker  . Smokeless tobacco: None  . Alcohol Use: Yes     Comment: Refuses to disclose how much  . Drug Use: No     Comment: Refuses to answer  . Sexual Activity: Not Asked     Comment: refused to answer   Other Topics Concern  . None   Social History Narrative   Risk to Self: Is patient at risk for suicide?: No What has been your use of drugs/alcohol within the last 12 months?: Drinks beer Risk to Others:   Prior Inpatient Therapy:   Prior Outpatient Therapy:    Level of Care:  OP  Hospital Course:  Kayanne Gow was admitted for Schizophrenia, paranoid (HCC) and crisis management.  She was treated discharged with the medications listed below under Medication List.  Medical problems were identified and treated as needed.  Home medications were restarted as appropriate.  Improvement was monitored by observation and [name] daily report of symptom reduction.  Emotional and mental status was monitored by daily self-inventory reports completed by [name] and clinical staff.         Zuleyka Parmely was evaluated by the treatment team for stability and plans for continued recovery upon discharge.  Adelena Galat motivation was an integral factor for scheduling further treatment.  Employment, transportation, bed availability, health status, family support, and any pending legal issues were also considered during her hospital stay.  She was offered further treatment options upon discharge including but not limited to Residential, Intensive Outpatient, and Outpatient treatment.  Ceaira Fishburne will follow up with the services as listed below under Follow Up Information.     Upon completion of this admission the patient was both mentally and medically stable for discharge denying suicidal/homicidal ideation, auditory/visual/tactile hallucinations, delusional thoughts and paranoia.      Consults:  psychiatry  Significant Diagnostic Studies:  labs: per ED  Discharge Vitals:   Blood  pressure 103/87, pulse 93, temperature 97.8 F (36.6 C), temperature source Oral, resp. rate 18, height 5\' 3"  (1.6 m), weight 63.957 kg (141 lb). Body mass index is 24.98 kg/(m^2). Lab Results:   No results found for this or any previous visit (from the past 72 hour(s)).  Physical Findings: AIMS: Facial and Oral Movements Muscles of Facial Expression: None, normal Lips and Perioral Area: None, normal Jaw: None, normal Tongue: None, normal,Extremity Movements Upper (arms, wrists, hands, fingers): None, normal Lower (legs, knees, ankles, toes): None, normal, Trunk Movements Neck, shoulders, hips: None, normal, Overall Severity Severity of abnormal movements (highest score from questions above): None, normal Incapacitation due to abnormal movements: None, normal Patient's awareness of abnormal movements (rate only patient's report): No Awareness, Dental Status Current problems with teeth and/or dentures?: No Does patient usually wear dentures?: No  CIWA:  CIWA-Ar Total: 3 COWS:  COWS Total Score: 2   See Psychiatric Specialty Exam and Suicide Risk Assessment completed by Attending Physician prior to discharge.  Discharge destination:  Home  Is patient on multiple antipsychotic therapies at discharge:  No   Has Patient had three or more failed trials of antipsychotic monotherapy by history:  No    Recommended Plan for Multiple Antipsychotic Therapies: NA     Medication List    TAKE these medications      Indication   carbamazepine 100 MG chewable tablet  Commonly known as:  TEGRETOL  Chew 3 tablets (300 mg total) by mouth 2 (two) times daily after a meal.   Indication:  labile mood     paliperidone 156 MG/ML Susp injection  Commonly known as:  INVEGA SUSTENNA  Inject 1 mL (156 mg total) into the muscle every 30 (thirty) days. Next dose due 06/06/2015.  Last dose was given 05/06/2015  Start taking on:  06/06/2015   Indication:  mood stabilization            Follow-up Information    Follow up with [facility] On 05/20/2015.   Why:  Initial appointment to establish care at [facility] on 10/31 at 8 AM, follow up w psychiatrist on 07/09/15 at 1 PM. [facility] scheduler is Mr Elige Radon at [phone]   Contact information:   [address] [place], [place] [address] Phone:  [phone]  Fax # requested for mental health clinic, not received from provider      Follow-up recommendations:  180 day outpatient commitment  Invega IM injection schedule and due 06/06/2015  Comments:  1.  Take all your medications as prescribed.              2.  Report any adverse side effects to outpatient provider.                       3.  Patient instructed to not use alcohol or illegal drugs while on prescription medicines.            4.  In the event of worsening symptoms, instructed patient to call 911, the crisis hotline or go to nearest emergency room for evaluation of  symptoms.  Total Discharge Time:  30 min  Signed: Velna Hatchet May Denise Bramblett AGNP-BC 05/07/2015, 10:43 AM

## 2015-05-07 NOTE — Clinical Social Work Note (Signed)
CSW spoke with sister/guardian [name] Lennon [phone] to inform her of patient's discharge today. Sister agreeable to pick patient up at 2pm and is [facility].   Samuella BruinKristin Edwin Baines, MSW, [employer]CSWA Clinical Social Worker [facility] [phone]

## 2015-05-07 NOTE — [facility] Group Notes (Signed)
[facility] LCSW Group Therapy  05/07/2015   1:15 PM   Type of Therapy:  Group Therapy  Participation Level:  Minimal  Participation Quality:  Minimal  Affect:  Depressed and Flat  Cognitive:  Alert and Oriented  Insight:  Developing/Improving and Engaged  Engagement in Therapy:  Developing/Improving and Engaged  Modes of Intervention:  Clarification, Confrontation, Discussion, Education, Exploration, Limit-setting, Orientation, Problem-solving, Rapport Building, Dance movement psychotherapisteality Testing, Socialization and Support  Summary of Progress/Problems: The topic for group therapy was feelings about diagnosis.  Pt actively participated in group discussion on their past and current diagnosis and how they feel towards this.  Pt also identified how society and family members judge them, based on their diagnosis as well as stereotypes and stigmas.  Patient was observed listening during conversation but did not participate in discussion despite CSW encouragement.  Samuella BruinKristin Aldona Bryner, MSW, [employer]CSWA Clinical Social Worker [facility] [phone]

## 2015-05-07 NOTE — Progress Notes (Signed)
Patient ID: [name], female   DOB: 1961-01-13, 54 y.o.   MRN: [id number]   Pt discharged home with her guardian and sister. Pt was stable and appreciative at that time. All papers and prescriptions were given to her sister and pts valuables returned. Verbal understanding expressed. Denies SI/HI and A/VH. Pt given opportunity to express concerns and ask questions.

## 2015-05-07 NOTE — Progress Notes (Addendum)
D    Pt has remained in her room the majority of the shift    She was pleasant and acknowledged me when I entered  Her room but did not engage in any further conversation    A    Verbal support given   Medications offered    Q 15 min checks R    Pt safe at present but did not request any further medications

## 2016-01-25 ENCOUNTER — Encounter (HOSPITAL_COMMUNITY): Payer: Self-pay | Admitting: *Deleted

## 2016-01-25 ENCOUNTER — Emergency Department (HOSPITAL_COMMUNITY)
Admission: EM | Admit: 2016-01-25 | Discharge: 2016-01-28 | Disposition: A | Discharge status: Home / Self Care | Payer: Medicare Other

## 2016-01-25 DIAGNOSIS — Z79899 Other long term (current) drug therapy: Secondary | ICD-10-CM | POA: Insufficient documentation

## 2016-01-25 DIAGNOSIS — Z5181 Encounter for therapeutic drug level monitoring: Secondary | ICD-10-CM | POA: Insufficient documentation

## 2016-01-25 DIAGNOSIS — F17200 Nicotine dependence, unspecified, uncomplicated: Secondary | ICD-10-CM | POA: Insufficient documentation

## 2016-01-25 DIAGNOSIS — F200 Paranoid schizophrenia: Secondary | ICD-10-CM | POA: Diagnosis present

## 2016-01-25 LAB — CBC
HCT: 39.8 % (ref 36.0–46.0)
HEMOGLOBIN: 13.6 g/dL (ref 12.0–15.0)
MCH: 27.4 pg (ref 26.0–34.0)
MCHC: 34.2 g/dL (ref 30.0–36.0)
MCV: 80.2 fL (ref 78.0–100.0)
Platelets: 239 10*3/uL (ref 150–400)
RBC: 4.96 MIL/uL (ref 3.87–5.11)
RDW: 13.4 % (ref 11.5–15.5)
WBC: 7 10*3/uL (ref 4.0–10.5)

## 2016-01-25 LAB — RAPID URINE DRUG SCREEN, HOSP PERFORMED
Amphetamines: NOT DETECTED
Barbiturates: NOT DETECTED
Benzodiazepines: NOT DETECTED
COCAINE: NOT DETECTED
OPIATES: NOT DETECTED
TETRAHYDROCANNABINOL: NOT DETECTED

## 2016-01-25 LAB — COMPREHENSIVE METABOLIC PANEL
ALT: 10 U/L — AB (ref 14–54)
ANION GAP: 10 (ref 5–15)
AST: 26 U/L (ref 15–41)
Albumin: 4.6 g/dL (ref 3.5–5.0)
Alkaline Phosphatase: 68 U/L (ref 38–126)
BUN: 19 mg/dL (ref 6–20)
CHLORIDE: 110 mmol/L (ref 101–111)
CO2: 22 mmol/L (ref 22–32)
Calcium: 9.1 mg/dL (ref 8.9–10.3)
Creatinine, Ser: 0.76 mg/dL (ref 0.44–1.00)
GFR calc non Af Amer: >60 mL/min (ref >60)
Glucose, Bld: 102 mg/dL — ABNORMAL HIGH (ref 65–99)
POTASSIUM: 4 mmol/L (ref 3.5–5.1)
SODIUM: 142 mmol/L (ref 135–145)
Total Bilirubin: 0.5 mg/dL (ref 0.3–1.2)
Total Protein: 7.7 g/dL (ref 6.5–8.1)

## 2016-01-25 LAB — ETHANOL: Alcohol, Ethyl (B): <5 mg/dL (ref <5)

## 2016-01-25 LAB — CARBAMAZEPINE LEVEL, TOTAL: Carbamazepine Lvl: <2 ug/mL — ABNORMAL LOW (ref 4.0–12.0)

## 2016-01-25 LAB — ACETAMINOPHEN LEVEL

## 2016-01-25 LAB — SALICYLATE LEVEL

## 2016-01-25 MED ORDER — HALOPERIDOL LACTATE 5 MG/ML IJ SOLN
5.0000 mg | Freq: Once | INTRAMUSCULAR | Status: DC
  Administered 2016-01-25: 5 mg via INTRAMUSCULAR
  Filled 2016-01-25: qty 1

## 2016-01-25 MED ORDER — HALOPERIDOL 2 MG PO TABS
2.0000 mg | ORAL_TABLET | Freq: Two times a day (BID) | ORAL | Status: DC
  Administered 2016-01-25 – 2016-01-26 (×2): 2 mg via ORAL
  Filled 2016-01-25 (×2): qty 1

## 2016-01-25 MED ORDER — BENZTROPINE MESYLATE 1 MG PO TABS
0.5000 mg | ORAL_TABLET | Freq: Two times a day (BID) | ORAL | Status: DC
  Administered 2016-01-25 – 2016-01-28 (×6): 0.5 mg via ORAL
  Filled 2016-01-25 (×6): qty 1

## 2016-01-25 MED ORDER — LORAZEPAM 1 MG PO TABS: 1.0000 mg | ORAL_TABLET | Freq: Three times a day (TID) | ORAL | Status: DC | PRN

## 2016-01-25 MED ORDER — ACETAMINOPHEN 325 MG PO TABS: 650.0000 mg | ORAL_TABLET | ORAL | Status: DC | PRN

## 2016-01-25 MED ORDER — HALOPERIDOL LACTATE 5 MG/ML IJ SOLN
5.0000 mg | Freq: Once | INTRAMUSCULAR | Status: AC
  Administered 2016-01-25: 5 mg via INTRAMUSCULAR
  Filled 2016-01-25: qty 1

## 2016-01-25 MED ORDER — ZOLPIDEM TARTRATE 5 MG PO TABS
5.0000 mg | ORAL_TABLET | Freq: Every day | ORAL | Status: DC
  Administered 2016-01-25: 5 mg via ORAL
  Filled 2016-01-25: qty 1

## 2016-01-25 MED ORDER — ONDANSETRON HCL 4 MG PO TABS: 4.0000 mg | ORAL_TABLET | Freq: Three times a day (TID) | ORAL | Status: DC | PRN

## 2016-01-25 MED ORDER — CARBAMAZEPINE ER 200 MG PO TB12
200.0000 mg | ORAL_TABLET | Freq: Two times a day (BID) | ORAL | Status: DC
  Administered 2016-01-25 – 2016-01-28 (×6): 200 mg via ORAL
  Filled 2016-01-25 (×6): qty 1

## 2016-01-25 NOTE — ED Notes (Signed)
Writer was informed by RN that pt will be given meds before labs are drawn.

## 2016-01-25 NOTE — ED Notes (Signed)
Patient and belonings wanded by security, belongings sitting at charge nurse desk

## 2016-01-25 NOTE — ED Notes (Signed)
Up tot he bathroom to shower and change scrubs 

## 2016-01-25 NOTE — ED Notes (Addendum)
[name], sister and legal guardian (per court order - see hard copy chart) states patient lives with her mother and has become increasingly agitated and uncooperative at home.  Sister states patient isn't taking her medications and refuses to follow[facility].  Patient started threatening to kill her brother, who was helping mother work on house today.  Patient had a pocket knife, but did not threaten anyone with it.  Sister would like her to be sent to the [facility] so she can be placed in an Assisted Living situation through the [facility].  Patient is a [employer] vet.  Sister states patient has struggled with paranoid schizophrenia for 25 - 30 years.  Patient has also struggled with anorexia, agitation and anger.  Patient also has an imaginary friend who agitates her and "eggs her on to do certain things" such as hurting other people.  Sister may be reached at [phone].  Sister thinks patient may be drinking beer, but does not believe street drugs are a factor.

## 2016-01-25 NOTE — ED Notes (Signed)
Pt arrived via [facility] with IVC papers, pt violent to family and threatening to kill them. Pt noncomplaint in explaining why she is here.

## 2016-01-25 NOTE — ED Notes (Signed)
.  .  .        xx

## 2016-01-25 NOTE — ED Notes (Signed)
Patient noted sleeping in room. No complaints, stable, in no acute distress. Q15 minute rounds and monitoring via Security Cameras to continue.  

## 2016-01-25 NOTE — ED Notes (Signed)
Report from Janie Rambo RN. Patient sleeping, respirations regular and unlabored. Q15 minute rounds and security camera observation to continue.   

## 2016-01-25 NOTE — ED Notes (Signed)
Ambulatory to room 36 w/o difficulty, pt up to the bathroom on arrival.

## 2016-01-25 NOTE — BH Assessment (Addendum)
Assessment Note  [name] is an 55 y.o. female that presents this date under IVC initiated by her guardian [name] [phone] (could not be reached) . Patient was highly agitated on admission and was not oriented to time/place. Patient was incoherent, disorganized and difficult to understand. Patient would add words to words (word salad) and was difficult to redirect. When this writer attempted to clarify information patient became highly agitated. Patient was unable to assess and admission notes were utilized to complete assessment. Per IVC:   "[name], sister and legal guardian (per court order - see hard copy chart) states patient lives with her mother and has become increasingly agitated and uncooperative at home. Sister states patient isn't taking her medications and refuses to follow-up at [facility]. Patient started threatening to kill her brother, who was helping mother work on house today. Patient had a pocket knife, but did not threaten anyone with it. Sister would like her to be sent to the [facility] so she can be placed in an Assisted Living situation through the [facility]. Patient is a Biomedical engineer. Sister states patient has struggled with paranoid schizophrenia for 25 - 30 years. Patient has also struggled with anorexia, agitation and anger. Patient also has an imaginary friend who agitates her and "eggs her on to do certain things" such as hurting other people. Sister may be reached at [phone]. Sister thinks patient may be drinking beer, but does not believe street drugs are a factor". Patient was transported by [facility]. Patient per notes, has had multiple admission with [facility] in 2016 and in 2013. Case was staffed with Paula Pollack DNP who recommended inpatient admission per IVC as appropriate bed placement is investigated.    Diagnosis: Schizophrenia per IVC  Past Medical History:  Past Medical History  Diagnosis Date  . Schizophrenia (HCC)   . Non compliance w medication regimen      History reviewed. No pertinent past surgical history.  Family History: No family history on file.  Social History:  reports that she has been smoking.  She does not have any smokeless tobacco history on file. She reports that she drinks alcohol. She reports that she does not use illicit drugs.  Additional Social History:  Alcohol / Drug Use Pain Medications: See MAR Prescriptions: See MAR Over the Counter: See MAR History of alcohol / drug use?:  (UTA guardian cannot be reached) Longest period of sobriety (when/how long):  (UTA) Negative Consequences of Use:  (UTA) Withdrawal Symptoms:  (None noted per notes)  CIWA: CIWA-Ar BP: 130/69 mmHg Pulse Rate: 92 COWS:    Allergies:  Allergies  Allergen Reactions  . Aspirin Other (See Comments)    Pt refused to state reaction. Pt's sister states its an allergy and unsure of reaction.  [name] [Ziprasidone Hcl]     Other reaction(s): Other (See Comments) Body movements [facility]ed [facility]    Home Medications:  (Not in a hospital admission)  OB/GYN Status:  No LMP recorded. Patient is postmenopausal.  General Assessment Data [facility] of Assessment: [facility] TTS Assessment: In system Is this a Tele or Face-to-Face Assessment?: Face-to-Face Is this an Initial Assessment or a Re-assessment for this encounter?: Initial Assessment Marital status:  Rich Reining) Maiden name: NA Is patient pregnant?: Unknown Pregnancy Status: Unknown Living Arrangements: Parent (per notes) Can pt return to current living arrangement?:  (Unknown guardian cannot be reached) Admission Status: Involuntary Is patient capable of signing voluntary admission?: No Referral Source: Other (Guardian Paula Tyler Deis  [phone]) Insurance type:  (Unknown)  Medical Screening Exam [facility] Walk-in ONLY) Medical Exam completed: Yes  Crisis Care Plan Living Arrangements: Parent (per notes) Legal Guardian: Other relative (Sister Paula Tyler Deis) Name of Psychiatrist: Unknown Name of Therapist: Unknown  Education Status Is patient currently in school?: No Current Grade: na Highest grade of school patient has completed:  (UTA) Name of school:  (NA) Contact person:  (NA)  Risk to self with the past 6 months Suicidal Ideation:  (UTA) Has patient been a risk to self within the past 6 months prior to admission? :  (UTA) Suicidal Intent:  (UTA) Has patient had any suicidal intent within the past 6 months prior to admission? :  (UTA) Is patient at risk for suicide?: Yes Suicidal Plan?:  (UTA) Has patient had any suicidal plan within the past 6 months prior to admission? :  (UTA) Access to Means:  (UTA) What has been your use of drugs/alcohol within the last 12 months?:  (UTA) Previous Attempts/Gestures:  (UTA) How many times?:  (UTA) Other Self Harm Risks:  (UTA) Triggers for Past Attempts:  (UTA) Intentional Self Injurious Behavior:  (UTA) Family Suicide History: Unknown Recent stressful life event(s):  (UTA) Persecutory voices/beliefs?:  (UTA) Depression:  (UTA) Depression Symptoms:  (UTA) Substance abuse history and/or treatment for substance abuse?:  (Unknown) Suicide prevention information given to non-admitted patients: Not applicable  Risk to Others within the past 6 months Homicidal Ideation: Yes-Currently Present (per IVC) Does patient have any lifetime risk of violence toward others beyond the six months prior to admission? : Yes (comment) (per IVC) Thoughts of Harm to Others: Yes-Currently Present (per IVC) Comment - Thoughts of Harm to Others: Per IVC pt stated she was going to kill brother Current Homicidal Intent: Yes-Currently Present Current Homicidal Plan: No (not noted per IVC) Access to Homicidal Means:  (UTA) Identified Victim: [name] brother per IVC History of harm to others?:  (UTA) Assessment of Violence: On admission Violent Behavior Description: per IVC fighting family members Does  patient have access to weapons?:  (UTA) Criminal Charges Pending?:  (UTA) Does patient have a court date:  (UTA) Is patient on probation?:  (UTA)  Psychosis Hallucinations:  (UTA) Delusions:  (UTA)  Mental Status Report Appearance/Hygiene: Poor hygiene Eye Contact: Poor Motor Activity: Agitation Speech: Word salad, Loud Level of Consciousness: Irritable Mood: Anxious Affect: Anxious, Angry Anxiety Level: Moderate Thought Processes: Irrelevant Judgement: Impaired Orientation: Not oriented Obsessive Compulsive Thoughts/Behaviors: Unable to Assess  Cognitive Functioning Concentration: Poor Memory: Unable to Assess IQ:  (UTA) Insight: Unable to Assess Impulse Control: Unable to Assess Appetite:  (UTA) Weight Loss:  (UTA) Weight Gain:  (UTA) Sleep: Unable to Assess Total Hours of Sleep:  (UTA) Vegetative Symptoms: Unable to Assess  ADLScreening [facility] Assessment Services) Patient's cognitive ability adequate to safely complete daily activities?: No Patient able to express need for assistance with ADLs?: No (UTA) Independently performs ADLs?:  (UTA)  Prior Inpatient Therapy Prior Inpatient Therapy: Yes Prior Therapy Dates: 2016, 2013 Prior Therapy Facilty/Provider(s): [facility] Reason for Treatment: Mental health issues  Prior Outpatient Therapy Prior Outpatient Therapy:  (UTA) Prior Therapy Dates:  (UTA) Prior Therapy Facilty/Provider(s): no history per notes (UTA) Reason for Treatment:  (NA) Does patient have an ACCT team?: Unknown Does patient have Intensive In-House Services?  : Unknown Does patient have [facility] services? : Unknown Does patient have P4CC services?: Unknown  ADL Screening (condition at time of admission) Patient's cognitive ability adequate to safely complete daily activities?: No  Is the patient deaf or have difficulty hearing?:  (UTA) Does the patient have difficulty seeing, even when wearing glasses/contacts?:  (UTA) Does the patient have  difficulty concentrating, remembering, or making decisions?: Yes Patient able to express need for assistance with ADLs?: No (UTA) Does the patient have difficulty dressing or bathing?:  (UTA) Independently performs ADLs?:  (UTA) Does the patient have difficulty walking or climbing stairs?:  (UTA) Weakness of Legs:  (UTA) Weakness of Arms/Hands:  (UTA)  Home Assistive Devices/Equipment Home Assistive Devices/Equipment:  (UTA)  Therapy Consults (therapy consults require a physician order) PT Evaluation Needed:  (UTA) OT Evalulation Needed:  (UTA) SLP Evaluation Needed:  (UTA)       Advance Directives (For Healthcare) Does patient have an advance directive?: No Would patient like information on creating an advanced directive?: No - patient declined information (pt declined but is actively psychotic)    Additional Information 1:1 In Past 12 Months?:  (UTA) CIRT Risk: Yes Elopement Risk: Yes Does patient have medical clearance?: Yes     Disposition: Case was staffed with Paula PollackLord DNP who recommended inpatient admission per IVC as appropriate bed placement is investigated.   Disposition Initial Assessment Completed for this Encounter: Yes Disposition of Patient: Inpatient treatment program Type of inpatient treatment program: Adult  On Site Evaluation by:   Reviewed with Physician:    Alfredia Fergusonavid L Tahani Potier 01/25/2016 3:52 PM

## 2016-01-25 NOTE — ED Provider Notes (Signed)
CSN: [id number]     Arrival date & time 01/25/16  1337 History   First MD Initiated Contact with Patient 01/25/16 1344     Chief Complaint  Patient presents with  . Homicidal     (Consider location/radiation/quality/duration/timing/severity/associated sxs/prior Treatment) The history is provided by the patient.  55 [facility] Patient very agitated. Patient very aggressive. Patient with a history of paranoid schizophrenia. Is suspected that she has not been taking her medications. She's been noncompliant in the past. Patient lives with her mother. Patient's legal guardian is her sister. Patient is threatening to harm others. She was threatening to kill her brother.  No history of any harm to her self today.  Past Medical History  Diagnosis Date  . Schizophrenia (HCC)   . Non compliance w medication regimen    History reviewed. No pertinent past surgical history. No family history on file. Social History  Substance Use Topics  . Smoking status: Current Every Day Smoker  . Smokeless tobacco: None  . Alcohol Use: Yes     Comment: Refuses to disclose how much   OB History    No data available     Review of Systems  Unable to perform ROS: Psychiatric disorder      Allergies  Aspirin and Geodon  Home Medications   Prior to Admission medications   Not on File   BP 130/69 mmHg  Pulse 92  Temp(Src) 98.9 F (37.2 C) (Oral)  Resp 22  SpO2 97% Physical Exam  Constitutional: She appears well-developed and well-nourished. No distress.  HENT:  Head: Normocephalic and atraumatic.  Mouth/Throat: Oropharynx is clear and moist.  Eyes: Conjunctivae and EOM are normal. Pupils are equal, round, and reactive to light.  Neck: Normal range of motion. Neck supple.  Cardiovascular: Normal rate, regular rhythm and normal heart sounds.   Pulmonary/Chest: Effort normal and breath sounds normal.  Abdominal: Soft. Bowel sounds are normal. There is  no tenderness.  Musculoskeletal: Normal range of motion.  Neurological: She is alert. No cranial nerve deficit. She exhibits normal muscle tone. Coordination normal.  Not oriented agitated and confused but alert.  Skin: Skin is warm.  Nursing note and vitals reviewed.   ED Course  Procedures (including critical care time) Labs Review Labs Reviewed  COMPREHENSIVE METABOLIC PANEL - Abnormal; Notable for the following:    Glucose, Bld 102 (*)    ALT 10 (*)    All other components within normal limits  ACETAMINOPHEN LEVEL - Abnormal; Notable for the following:    Acetaminophen (Tylenol), Serum <10 (*)    All other components within normal limits  ETHANOL  SALICYLATE LEVEL  CBC  URINE RAPID DRUG SCREEN, HOSP PERFORMED  CARBAMAZEPINE LEVEL, TOTAL   Results for orders placed or performed during the hospital encounter of 01/25/16  Comprehensive metabolic panel  Result Value Ref Range   Sodium 142 135 - 145 mmol/L   Potassium 4.0 3.5 - 5.1 mmol/L   Chloride 110 101 - 111 mmol/L   CO2 22 22 - 32 mmol/L   Glucose, Bld 102 (H) 65 - 99 mg/dL   BUN 19 6 - 20 mg/dL   Creatinine, Ser 4.090.76 0.44 - 1.00 mg/dL   Calcium 9.1 8.9 - 81.110.3 mg/dL   Total Protein 7.7 6.5 - 8.1 g/dL   Albumin 4.6 3.5 - 5.0 g/dL   AST 26 15 - 41 U/L   ALT 10 (L) 14 - 54 U/L   Alkaline Phosphatase 68 38 -  126 U/L   Total Bilirubin 0.5 0.3 - 1.2 mg/dL   GFR calc non Af Amer >60 >60 mL/min   GFR calc Af Amer >60 >60 mL/min   Anion gap 10 5 - 15  Ethanol  Result Value Ref Range   Alcohol, Ethyl (B) <5 <5 mg/dL  Salicylate level  Result Value Ref Range   Salicylate Lvl <4.0 2.8 - 30.0 mg/dL  Acetaminophen level  Result Value Ref Range   Acetaminophen (Tylenol), Serum <10 (L) 10 - 30 ug/mL  cbc  Result Value Ref Range   WBC 7.0 4.0 - 10.5 K/uL   RBC 4.96 3.87 - 5.11 MIL/uL   Hemoglobin 13.6 12.0 - 15.0 g/dL   HCT 16.1 09.6 - 04.5 %   MCV 80.2 78.0 - 100.0 fL   MCH 27.4 26.0 - 34.0 pg   MCHC 34.2 30.0 -  36.0 g/dL   RDW 40.9 81.1 - 91.4 %   Platelets 239 150 - 400 K/uL  Rapid urine drug screen (hospital performed)  Result Value Ref Range   Opiates NONE DETECTED NONE DETECTED   Cocaine NONE DETECTED NONE DETECTED   Benzodiazepines NONE DETECTED NONE DETECTED   Amphetamines NONE DETECTED NONE DETECTED   Tetrahydrocannabinol NONE DETECTED NONE DETECTED   Barbiturates NONE DETECTED NONE DETECTED     Imaging Review No results found. I have personally reviewed and evaluated these images and lab results as part of my medical decision-making.   EKG Interpretation None      MDM   Final diagnoses:  Paranoid schizophrenia [facility])    Patient brought in by [facility] department threatening others threatening to kill people patient with history of paranoid schizophrenia. Patient's legal guardian is her sister. Patient required Haldol here IM to calm her down. Patient has an allergy to Geodon supposedly. Patient is now more cooperative. Patient has been evaluated by behavioral health team. The recommending inpatient treatment. Patient will be moved to the psych ED. Temporary holding orders have been done. Suspect patient supposed to be on medications we have not been able to verify that yet. Patient in the past was on Tegretol Tegretol level is pending. Not certain whether she still supposed to be on it now or not.    Vanetta Mulders, MD 01/25/16 [phone]

## 2016-01-26 DIAGNOSIS — F200 Paranoid schizophrenia: Secondary | ICD-10-CM | POA: Diagnosis not present

## 2016-01-26 MED ORDER — TRAZODONE HCL 50 MG PO TABS
50.0000 mg | ORAL_TABLET | Freq: Every day | ORAL | Status: DC
  Administered 2016-01-26 – 2016-01-27 (×2): 50 mg via ORAL
  Filled 2016-01-26 (×2): qty 1

## 2016-01-26 MED ORDER — HALOPERIDOL 5 MG PO TABS
5.0000 mg | ORAL_TABLET | Freq: Two times a day (BID) | ORAL | Status: DC
  Administered 2016-01-26 – 2016-01-28 (×4): 5 mg via ORAL
  Filled 2016-01-26 (×4): qty 1

## 2016-01-26 NOTE — ED Notes (Signed)
Patient noted sleeping in room. No complaints, stable, in no acute distress. Q15 minute rounds and monitoring via Security Cameras to continue.  

## 2016-01-26 NOTE — ED Notes (Signed)
Dr Mervyn SkeetersA and Catha NottinghamJamison DNP into see.  Pt continues to deny SI/HI/AVH at this time.  Pt then reported that "all I been talking to is the government."  "small life form...shows up when I'm in conflict with somebody..."  Pt also sts "I'm always dealing with litigation."  "dealing with home intrusion..send more life forms..."  .

## 2016-01-26 NOTE — Consult Note (Signed)
[facility] Face-to-Face Psychiatry Consult   Reason for Consult: disorganized thoughts, paranoid thoughts Referring Physician: EDP Patient Identification: [name] MRN:  [id number] Principal Diagnosis: Schizophrenia, paranoid (HCC) Diagnosis:   Patient Active Problem List   Diagnosis Date Noted  . Schizophrenia, paranoid (HCC) [F20.0] 11/07/2011    Priority: High  . Noncompliance with diet and medication regimen [Z91.11, Z91.14]   . Psychoses [F29]   . Hypokalemic alkalosis [E87.3] 11/11/2011  . Medically noncompliant [Z91.19] 11/11/2011    Total Time spent with patient: 45 minutes  Subjective:   [name] is a 55 y.o. female patient admitted with disorganized behavior and paranoia.  HPI: Patient has long history of Paranoid Schizophrenia and non-compliant with medication. Patient presents with bizarre/disorganized behavior, tangential and extremely paranoid. Patient is rambling,incoherent, difficult to understand and jumps from topics to topics and difficult to re-direct. Patient reports that she lives with her family but does not get along with them. Collateral information obtained from the sister revealed that patient isn't taking her medications and refuses to follow-up at [facility]. Patient started threatening to kill her brother, who was helping mother work on house today. Patient reportedly had a pocket knife, but did not threaten anyone with it. Sister would like her to be sent to the [place] so she can be placed in an Assisted Living situation through the [place]. Patient is a Biomedical engineer. Sister states patient has struggled with paranoid schizophrenia for 25 - 30 years. Patient denies drugs and alcohol abuse.  Past Psychiatric History: as above  Risk to Self: Suicidal Ideation:  (UTA) Suicidal Intent:  (UTA) Is patient at risk for suicide?: Yes Suicidal Plan?:  (UTA) Access to Means:  (UTA) What has been your use of drugs/alcohol within the last 12 months?:  (UTA) How many times?:   (UTA) Other Self Harm Risks:  (UTA) Triggers for Past Attempts:  (UTA) Intentional Self Injurious Behavior:  (UTA) Risk to Others: Homicidal Ideation: Yes-Currently Present (per IVC) Thoughts of Harm to Others: Yes-Currently Present (per IVC) Comment - Thoughts of Harm to Others: Per IVC pt stated she was going to kill brother Current Homicidal Intent: Yes-Currently Present Current Homicidal Plan: No (not noted per IVC) Access to Homicidal Means:  (UTA) Identified Victim: Remene brother per IVC History of harm to others?:  (UTA) Assessment of Violence: On admission Violent Behavior Description: per IVC fighting family members Does patient have access to weapons?:  (UTA) Criminal Charges Pending?:  (UTA) Does patient have a court date:  (UTA) Prior Inpatient Therapy: Prior Inpatient Therapy: Yes Prior Therapy Dates: 2016, 2013 Prior Therapy Facilty/Provider(s): [facility] Reason for Treatment: Mental health issues Prior Outpatient Therapy: Prior Outpatient Therapy:  (UTA) Prior Therapy Dates:  (UTA) Prior Therapy Facilty/Provider(s): no history per notes (UTA) Reason for Treatment:  (NA) Does patient have an ACCT team?: Unknown Does patient have Intensive In-House Services?  : Unknown Does patient have [facility] services? : Unknown Does patient have P4[facility] services?: Unknown  Past Medical History:  Past Medical History  Diagnosis Date  . Schizophrenia (HCC)   . Non compliance w medication regimen    History reviewed. No pertinent past surgical history. Family History: No family history on file. Family Psychiatric  History: none Social History:  History  Alcohol Use  . Yes    Comment: Refuses to disclose how much     History  Drug Use No    Comment: Refuses to answer    Social History   Social History  . Marital Status: Single  Spouse Name: [name]  . Number of Children: [name]  . Years of Education: [name]   Social History Main Topics  . Smoking status: Current Every Day Smoker   . Smokeless tobacco: None  . Alcohol Use: Yes     Comment: Refuses to disclose how much  . Drug Use: No     Comment: Refuses to answer  . Sexual Activity: Not Asked     Comment: refused to answer   Other Topics Concern  . None   Social History Narrative   Additional Social History:    Allergies:   Allergies  Allergen Reactions  . Aspirin Other (See Comments)    Pt refused to state reaction. Pt's sister states its an allergy and unsure of reaction.  Lindajo Royal [Ziprasidone Hcl]     Other reaction(s): Other (See Comments) Body movements Listed [facility] from [facility]    Labs:  Results for orders placed or performed during the hospital encounter of 01/25/16 (from the past 48 hour(s))  Rapid urine drug screen (hospital performed)     Status: None   Collection Time: 01/25/16  2:35 PM  Result Value Ref Range   Opiates NONE DETECTED NONE DETECTED   Cocaine NONE DETECTED NONE DETECTED   Benzodiazepines NONE DETECTED NONE DETECTED   Amphetamines NONE DETECTED NONE DETECTED   Tetrahydrocannabinol NONE DETECTED NONE DETECTED   Barbiturates NONE DETECTED NONE DETECTED    Comment:        DRUG SCREEN FOR MEDICAL PURPOSES ONLY.  IF CONFIRMATION IS NEEDED FOR ANY PURPOSE, NOTIFY LAB WITHIN 5 DAYS.        LOWEST DETECTABLE LIMITS FOR URINE DRUG SCREEN Drug Class       Cutoff (ng/mL) Amphetamine      1000 Barbiturate      200 Benzodiazepine   824 Tricyclics       235 Opiates          300 Cocaine          300 THC              50   Comprehensive metabolic panel     Status: Abnormal   Collection Time: 01/25/16  2:54 PM  Result Value Ref Range   Sodium 142 135 - 145 mmol/L   Potassium 4.0 3.5 - 5.1 mmol/L   Chloride 110 101 - 111 mmol/L   CO2 22 22 - 32 mmol/L   Glucose, Bld 102 (H) 65 - 99 mg/dL   BUN 19 6 - 20 mg/dL   Creatinine, Ser 0.76 0.44 - 1.00 mg/dL   Calcium 9.1 8.9 - 10.3 mg/dL   Total Protein 7.7 6.5 - 8.1 g/dL   Albumin 4.6 3.5 - 5.0 g/dL   AST 26 15  - 41 U/L   ALT 10 (L) 14 - 54 U/L   Alkaline Phosphatase 68 38 - 126 U/L   Total Bilirubin 0.5 0.3 - 1.2 mg/dL   GFR calc non Af Amer >60 >60 mL/min   GFR calc Af Amer >60 >60 mL/min    Comment: (NOTE) The eGFR has been calculated using the CKD EPI equation. This calculation has not been validated in all clinical situations. eGFR's persistently <60 mL/min signify possible Chronic Kidney Disease.    Anion gap 10 5 - 15  Ethanol     Status: None   Collection Time: 01/25/16  2:54 PM  Result Value Ref Range   Alcohol, Ethyl (B) <5 <5 mg/dL    Comment:  LOWEST DETECTABLE LIMIT FOR SERUM ALCOHOL IS 5 mg/dL FOR MEDICAL PURPOSES ONLY   Salicylate level     Status: None   Collection Time: 01/25/16  2:54 PM  Result Value Ref Range   Salicylate Lvl <7.3 2.8 - 30.0 mg/dL  Acetaminophen level     Status: Abnormal   Collection Time: 01/25/16  2:54 PM  Result Value Ref Range   Acetaminophen (Tylenol), Serum <10 (L) 10 - 30 ug/mL    Comment:        THERAPEUTIC CONCENTRATIONS VARY SIGNIFICANTLY. A RANGE OF 10-30 ug/mL MAY BE AN EFFECTIVE CONCENTRATION FOR MANY PATIENTS. HOWEVER, SOME ARE BEST TREATED AT CONCENTRATIONS OUTSIDE THIS RANGE. ACETAMINOPHEN CONCENTRATIONS >150 ug/mL AT 4 HOURS AFTER INGESTION AND >50 ug/mL AT 12 HOURS AFTER INGESTION ARE OFTEN ASSOCIATED WITH TOXIC REACTIONS.   cbc     Status: None   Collection Time: 01/25/16  2:54 PM  Result Value Ref Range   WBC 7.0 4.0 - 10.5 K/uL   RBC 4.96 3.87 - 5.11 MIL/uL   Hemoglobin 13.6 12.0 - 15.0 g/dL   HCT 39.8 36.0 - 46.0 %   MCV 80.2 78.0 - 100.0 fL   MCH 27.4 26.0 - 34.0 pg   MCHC 34.2 30.0 - 36.0 g/dL   RDW 13.4 11.5 - 15.5 %   Platelets 239 150 - 400 K/uL  Carbamazepine level, total     Status: Abnormal   Collection Time: 01/25/16  4:04 PM  Result Value Ref Range   Carbamazepine Lvl <2.0 (L) 4.0 - 12.0 ug/mL    Comment: Performed at [facility]    Current Facility-Administered Medications   Medication Dose Route Frequency Provider Last Rate Last Dose  . acetaminophen (TYLENOL) tablet 650 mg  650 mg Oral Q4H PRN Fredia Sorrow, MD      . benztropine (COGENTIN) tablet 0.5 mg  0.5 mg Oral BID Patrecia Pour, NP   0.5 mg at 01/26/16 0910  . carbamazepine (TEGRETOL XR) 12 hr tablet 200 mg  200 mg Oral BID Patrecia Pour, NP   200 mg at 01/26/16 0910  . haloperidol (HALDOL) tablet 5 mg  5 mg Oral BID Reilly Blades, MD      . LORazepam (ATIVAN) tablet 1 mg  1 mg Oral Q8H PRN Fredia Sorrow, MD      . ondansetron (ZOFRAN) tablet 4 mg  4 mg Oral Q8H PRN Fredia Sorrow, MD      . traZODone (DESYREL) tablet 50 mg  50 mg Oral QHS Corena Pilgrim, MD       No current outpatient prescriptions on file.    Musculoskeletal: Strength & Muscle Tone: within normal limits Gait & Station: normal Patient leans: [name]  Psychiatric Specialty Exam: Physical Exam  Psychiatric: Her affect is angry and labile. Her speech is rapid and/or pressured and tangential. She is aggressive and actively hallucinating. Thought content is paranoid. Cognition and memory are normal. She expresses impulsivity.    Review of Systems  Constitutional: Negative.   HENT: Negative.   Eyes: Negative.   Respiratory: Negative.   Cardiovascular: Negative.   Gastrointestinal: Negative.   Genitourinary: Negative.   Musculoskeletal: Negative.   Skin: Negative.   Neurological: Negative.   Endo/Heme/Allergies: Negative.   Psychiatric/Behavioral: Positive for hallucinations. The patient is nervous/anxious.     Blood pressure 112/77, pulse 66, temperature 98.1 F (36.7 C), temperature source Oral, resp. rate 17, SpO2 93 %.There is no weight on file to calculate BMI.  General Appearance: Disheveled  Eye Contact:  Minimal  Speech:  Garbled and Pressured  Volume:  Increased  Mood:  Angry and Irritable  Affect:  Labile  Thought Process:  Disorganized  Orientation:  Full (Time, Place, and Person)  Thought Content:   Illogical, Delusions and Hallucinations: Auditory  Suicidal Thoughts:  No  Homicidal Thoughts:  No  Memory:  Immediate;   Fair Recent;   Fair Remote;   Good  Judgement:  Impaired  Insight:  Lacking  Psychomotor Activity:  Increased and Restlessness  Concentration:  Concentration: Poor and Attention Span: Fair  Recall:  [employer] of Knowledge:  Fair  Language:  Fair  Akathisia:  No  Handed:  Right  AIMS (if indicated):     Assets:  Physical Health  ADL's:  Intact  Cognition:  WNL  Sleep:   poor     Treatment Plan Summary: -Crisis stabilization -Daily contact with patient to assess and evaluate symptoms,  progress in treatment and Medication management. -Increased Haldol to 5 mg bid for psychosis/delusion. -Cogentin 0.'5mg'$  bid EPS prevention. -Tegretol XR '200mg'$  bid for mood stabilization -Trazodone '50mg'$  qhs for insomnia   Disposition: Recommend psychiatric Inpatient admission when medically cleared. Supportive therapy provided about ongoing stressors. Patient will benefit from inpatient psychiatric admission.  Corena Pilgrim, MD 01/26/2016 11:47 AM

## 2016-01-26 NOTE — ED Notes (Signed)
Up to the bathroom 

## 2016-01-26 NOTE — ED Notes (Signed)
Report received from Janie Rambo RN. Patient alert and oriented, warm and dry, in no acute distress. Patient denies SI, HI, AVH and pain. Patient made aware of Q15 minute rounds and security cameras for their safety. Patient instructed to come to me with needs or concerns.  

## 2016-01-27 NOTE — ED Notes (Signed)
Pt sitting up on side of bed, calm & cooperative at present.  Awake, alert & responsive, no distress noted.  Monitoring for safety, Q 15 min checks in effect.

## 2016-01-27 NOTE — ED Notes (Signed)
Patient noted sleeping in room. No complaints, stable, in no acute distress. Q15 minute rounds and monitoring via Security Cameras to continue.  

## 2016-01-27 NOTE — BH Assessment (Addendum)
[facility] Assessment Progress Note This Clinical research associatewriter spoke with patient this date as patient presented with a somewhat agitated/anxious affect although was easily redirected at onset of our evaluation. Patient was time/place oriented but was somewhat disorganized and became agitated as this writer attempted to clarify some of her responses. Patient stated she is "still mad" but would not elaborate on what she is upset about. Patient's hygiene is poor and eye contact was limited. Collateral from staff nurse states patient has been compliant with her medications. Patient was liable and eventually refused to answer any further questions from this writer.

## 2016-01-27 NOTE — ED Notes (Signed)
Patients guardian would like to be notified when patient gets a bed assignment.  [name]  [phone]

## 2016-01-27 NOTE — ED Notes (Signed)
Pt is calm and pleasant and cooperative.  She has been compliant with her medications.  She is withdrawn in her room and sits quietly on edge of bed.  She states she is "Fine"  She denies S/I, H/I, and AVH.  15 minute checks and video monitoring continue.

## 2016-01-27 NOTE — BH Assessment (Addendum)
[facility] Assessment Progress Note  Per Thedore MinsMojeed Akintayo, MD, this pt requires psychiatric hospitalization at this time.  The following facilities have been contacted to seek placement for this pt, with results as noted:  Beds available, information sent, decision pending:  [facility]    At [facility]:  [facility]    I also called the [facility] at [phone] and spoke to April.  She reports that her direct extension is [phone].  She confirms that pt has VA benefits and [facility], adding that they currently have a few beds, but that they are on "ED diversion" at this time.  She agrees to fax their referral packet to me, which I have subsequently received, but advises me not to send it to them today.    Doylene Canninghomas Deyton Ellenbecker, MA Triage Specialist [phone]

## 2016-01-27 NOTE — Progress Notes (Signed)
[facility]/Admissions staff reached out to CSW and states that they are at capacity at this time.  Trish MageBrittney Adeyemi Hamad, LCSWA [phone] ED CSW 01/27/2016 4:20 PM

## 2016-01-28 ENCOUNTER — Encounter (HOSPITAL_COMMUNITY): Payer: Self-pay | Admitting: Nurse Practitioner

## 2016-01-28 DIAGNOSIS — F200 Paranoid schizophrenia: Secondary | ICD-10-CM | POA: Diagnosis not present

## 2016-01-28 NOTE — BH Assessment (Signed)
[facility] Assessment Progress Note  At 12:20 this Clinical research associatewriter spoke to April at the [facility].  She confirms receipt of all requested information, and agrees to staff it with their physician.  Decision is pending as of this writing.  Doylene Canninghomas Tanishka Drolet, MA Triage Specialist [phone]

## 2016-01-28 NOTE — BH Assessment (Signed)
[facility] Assessment Progress Note  Per Thedore MinsMojeed Akintayo, MD, this pt continues to require psychiatric hospitalization.  In preparation for completing referral to the [facility], this writer tried to reach pt's sister/guardian, [name], who also petitioned for IVC.  As pt's guardian, she will need to sign Provider Certification and Patient Consent for Transfer form, a VA system document.  Call was placed to the phone number found on Affidavit and Petition for Involuntary Commitment, [phone], at 10:10.  Call rolled to voice mail, and I left a message.  I also called [phone], the number of [name], with whom pt lives.  Phone rang repeatedly and no one picked up.  I then called pt's own phone number, [phone]; again, phone rang repeatedly and no one picked up.  I then called April at the [facility] [phone]).  She reports that they currently have beds available, but that I am not to fax referral information to her without including the signed Provider Certification form.  She states, however, that if the guardian is not present at the ED and I am able to reach her on the phone along with another ED staff member to serve as witness, I can sign in the guardian's name, along with the witness.  I then spoke to pt's nurse, [name].  She reports that she has spoken to [name] today, and that she plans to visit the pt at some time today.  If [name] reaches [name], she will let me know, and we will discuss the Provider Certification form with her.  A second attempt to reach Paula by phone was made at 10:38.  Call rolled to voice mail, but I did not leave a second message.  Paula Canninghomas Juline Sanderford, MA Triage Specialist [phone]

## 2016-01-28 NOTE — Consult Note (Signed)
[facility] Face-to-Face Psychiatry Consult   Reason for Consult: disorganized thoughts, paranoid thoughts Referring Physician: EDP Patient Identification: [name] MRN:  [id number] Principal Diagnosis: Schizophrenia, paranoid (HCC) Diagnosis:   Patient Active Problem List   Diagnosis Date Noted  . Noncompliance with diet and medication regimen [Z91.11, Z91.14]   . Psychoses [F29]   . Hypokalemic alkalosis [E87.3] 11/11/2011  . Medically noncompliant [Z91.19] 11/11/2011  . Schizophrenia, paranoid (HCC) [F20.0] 11/07/2011    Total Time spent with patient: 45 minutes  Subjective:   [name] is a 55 y.o. female patient admitted with disorganized behavior and paranoia.  HPI: Patient has long history of Paranoid Schizophrenia and non-compliant with medication. Patient presents with bizarre/disorganized behavior, tangential and extremely paranoid. Patient is rambling,incoherent, difficult to understand and jumps from topics to topics and difficult to re-direct. Patient reports that she lives with her family but does not get along with them. Collateral information obtained from the sister revealed that patient isn't taking her medications and refuses to follow-up at [facility]. Patient started threatening to kill her brother, who was helping mother work on house today. Patient reportedly had a pocket knife, but did not threaten anyone with it. Sister would like her to be sent to the [place] so she can be placed in an Assisted Living situation through the [place]. Patient is a Museum/gallery conservator. Sister states patient has struggled with paranoid schizophrenia for 25 - 30 years. Patient denies drugs and alcohol abuse.  She is seen today and patient continues to be paranoid, tangential, word salad speech with loose associations.    Past Psychiatric History: as above  Risk to Self: Suicidal Ideation:  (UTA) Suicidal Intent:  (UTA) Is patient at risk for suicide?: Yes Suicidal Plan?:  (UTA) Access to Means:   (UTA) What has been your use of drugs/alcohol within the last 12 months?:  (UTA) How many times?:  (UTA) Other Self Harm Risks:  (UTA) Triggers for Past Attempts:  (UTA) Intentional Self Injurious Behavior:  (UTA) Risk to Others: Homicidal Ideation: Yes-Currently Present (per IVC) Thoughts of Harm to Others: Yes-Currently Present (per IVC) Comment - Thoughts of Harm to Others: Per IVC pt stated she was going to kill brother Current Homicidal Intent: Yes-Currently Present Current Homicidal Plan: No (not noted per IVC) Access to Homicidal Means:  (UTA) Identified Victim: Remene brother per IVC History of harm to others?:  (UTA) Assessment of Violence: On admission Violent Behavior Description: per IVC fighting family members Does patient have access to weapons?:  (UTA) Criminal Charges Pending?:  (UTA) Does patient have a court date:  (UTA) Prior Inpatient Therapy: Prior Inpatient Therapy: Yes Prior Therapy Dates: 2016, 2013 Prior Therapy Facilty/Provider(s): [facility] Reason for Treatment: Mental health issues Prior Outpatient Therapy: Prior Outpatient Therapy:  (UTA) Prior Therapy Dates:  (UTA) Prior Therapy Facilty/Provider(s): no history per notes (UTA) Reason for Treatment:  (NA) Does patient have an ACCT team?: Unknown Does patient have Intensive In-House Services?  : Unknown Does patient have [facility] services? : Unknown Does patient have P4CC services?: Unknown  Past Medical History:  Past Medical History  Diagnosis Date  . Schizophrenia (HCC)   . Non compliance w medication regimen    History reviewed. No pertinent past surgical history. Family History: No family history on file. Family Psychiatric  History: none Social History:  History  Alcohol Use  . Yes    Comment: Refuses to disclose how much     History  Drug Use No    Comment: Refuses to  answer    Social History   Social History  . Marital Status: Single    Spouse Name: [name]  . Number of Children: [name]  .  Years of Education: [name]   Social History Main Topics  . Smoking status: Current Every Day Smoker  . Smokeless tobacco: None  . Alcohol Use: Yes     Comment: Refuses to disclose how much  . Drug Use: No     Comment: Refuses to answer  . Sexual Activity: Not Asked     Comment: refused to answer   Other Topics Concern  . None   Social History Narrative   Additional Social History:    Allergies:   Allergies  Allergen Reactions  . Aspirin Other (See Comments)    Pt refused to state reaction. Pt's sister states its an allergy and unsure of reaction.  [name] [Ziprasidone Hcl]     Other reaction(s): Other (See Comments) Body movements Listed [facility]    Labs:  No results found for this or any previous visit (from the past 48 hour(s)).  Current Facility-Administered Medications  Medication Dose Route Frequency Provider Last Rate Last Dose  . acetaminophen (TYLENOL) tablet 650 mg  650 mg Oral Q4H PRN Vanetta Mulders, MD      . benztropine (COGENTIN) tablet 0.5 mg  0.5 mg Oral BID Charm Rings, NP   0.5 mg at 01/28/16 1028  . carbamazepine (TEGRETOL XR) 12 hr tablet 200 mg  200 mg Oral BID Charm Rings, NP   200 mg at 01/28/16 1028  . haloperidol (HALDOL) tablet 5 mg  5 mg Oral BID Thedore Mins, MD   5 mg at 01/28/16 1028  . LORazepam (ATIVAN) tablet 1 mg  1 mg Oral Q8H PRN Vanetta Mulders, MD      . ondansetron (ZOFRAN) tablet 4 mg  4 mg Oral Q8H PRN Vanetta Mulders, MD      . traZODone (DESYREL) tablet 50 mg  50 mg Oral QHS Thedore Mins, MD   50 mg at 01/27/16 2123   No current outpatient prescriptions on file.    Musculoskeletal: Strength & Muscle Tone: within normal limits Gait & Station: normal Patient leans: [name]  Psychiatric Specialty Exam: Physical Exam  Vitals reviewed. Psychiatric: Her affect is angry and labile. Her speech is rapid and/or pressured and tangential. She is aggressive and actively hallucinating. Thought content is  paranoid. Cognition and memory are normal. She expresses impulsivity.    Review of Systems  Constitutional: Negative.   HENT: Negative.   Eyes: Negative.   Respiratory: Negative.   Cardiovascular: Negative.   Gastrointestinal: Negative.   Genitourinary: Negative.   Musculoskeletal: Negative.   Skin: Negative.   Neurological: Negative.   Endo/Heme/Allergies: Negative.   Psychiatric/Behavioral: Positive for hallucinations. The patient is nervous/anxious.     Blood pressure 99/59, pulse 71, temperature 97.9 F (36.6 C), temperature source Oral, resp. rate 18, SpO2 100 %.There is no weight on file to calculate BMI.  General Appearance: Disheveled  Eye Contact:  Minimal  Speech:  Garbled and Pressured  Volume:  Increased  Mood:  Angry and Irritable  Affect:  Labile  Thought Process:  Disorganized  Orientation:  Full (Time, Place, and Person)  Thought Content:  Illogical, Delusions and Hallucinations: Auditory  Suicidal Thoughts:  No  Homicidal Thoughts:  No  Memory:  Immediate;   Fair Recent;   Fair Remote;   Good  Judgement:  Impaired  Insight:  Lacking  Psychomotor  Activity:  Increased and Restlessness  Concentration:  Concentration: Poor and Attention Span: Fair  Recall:  [employer]  Fund of Knowledge:  Fair  Language:  Fair  Akathisia:  No  Handed:  Right  AIMS (if indicated):     Assets:  Physical Health  ADL's:  Intact  Cognition:  WNL  Sleep:   poor   Treatment Plan Summary: -Crisis stabilization -Daily contact with patient to assess and evaluate symptoms,  progress in treatment and Medication management. -Increased Haldol to 5 mg bid for psychosis/delusion. -Cogentin 0.5mg  bid EPS prevention. -Tegretol XR 200mg  bid for mood stabilization -Trazodone 50mg  qhs for insomnia  Disposition: Recommend psychiatric Inpatient admission when medically cleared. Supportive therapy provided about ongoing stressors. Patient will benefit from inpatient psychiatric admission.   [facility]  Lindwood QuaSheila May Agustin, NP [facility] 01/28/2016 11:44 AM Patient seen face-to-face for psychiatric evaluation, chart reviewed and case discussed with the physician extender and developed treatment plan. Reviewed the information documented and agree with the treatment plan. Thedore MinsMojeed Azeneth Carbonell, MD

## 2016-01-28 NOTE — BH Assessment (Signed)
[facility] Assessment Progress Note  At 14:10 April calls from [facility].  Pt has been accepted to their facility by Glori LuisAnkur Patel, MD.  However, they want May Agustin, NP's note to be sent to them once it is signed, even if it becomes available after pt is sent to them.  If we anticipate that pt will arrive after 16:30, report is to be called to [phone].  As an alternative, the Administrator on Duty (AOD) can be called at [phone].  If before 16:30, April advises us to call her back for an alternative phone number for report.  Patria ManeMay Agustin, NP, concurs with this decision.  Pt is under IVC and is to be transported via sheriff.  Pt's nurse, Kendal HymenEdie, has been notified, as has pt's sister/guardian.  Doylene Canninghomas Lamarr Feenstra, MA Triage Specialist [phone]

## 2016-01-28 NOTE — ED Notes (Signed)
[facility]  Pt was calm and cooperative at discharge.  Pt had no belongings to send.

## 2016-01-28 NOTE — ED Notes (Signed)
Patient continues to be medicine compliant.  She is sitting quietly in room but became very tangential during rounds.  She is eating and drinking fairly well and has been up to shower.  15 minute checks ands video monitoring continue.

## 2016-01-30 NOTE — ED Notes (Signed)
Chart reviewed-pt left belongings in unit.  Will attempt to contact

## 2016-01-30 NOTE — ED Notes (Signed)
No answer at number listed, belonging bag placed in hall closet

## 2016-01-30 NOTE — ED Notes (Signed)
Chart reviewed -belongings left in locker will attempt to contact

## 2016-01-30 NOTE — ED Notes (Signed)
Chart reviewed-pt left belongings in locker.  Unable to contact patient at number listed.  Belonging bag placed in hall closet

## 2017-03-15 ENCOUNTER — Encounter (HOSPITAL_COMMUNITY): Payer: Self-pay | Admitting: *Deleted

## 2017-03-15 ENCOUNTER — Emergency Department (HOSPITAL_COMMUNITY)
Admission: EM | Admit: 2017-03-15 | Discharge: 2017-03-17 | Disposition: A | Discharge status: Other Acute Inpatient Hospital | Payer: Medicare Other | Attending: Emergency Medicine | Admitting: Emergency Medicine

## 2017-03-15 DIAGNOSIS — F17210 Nicotine dependence, cigarettes, uncomplicated: Secondary | ICD-10-CM | POA: Insufficient documentation

## 2017-03-15 DIAGNOSIS — Z9111 Patient's noncompliance with dietary regimen: Secondary | ICD-10-CM | POA: Diagnosis not present

## 2017-03-15 DIAGNOSIS — Z0000 Encounter for general adult medical examination without abnormal findings: Secondary | ICD-10-CM

## 2017-03-15 DIAGNOSIS — Z9114 Patient's other noncompliance with medication regimen: Secondary | ICD-10-CM | POA: Insufficient documentation

## 2017-03-15 DIAGNOSIS — E873 Alkalosis: Secondary | ICD-10-CM | POA: Diagnosis not present

## 2017-03-15 DIAGNOSIS — F200 Paranoid schizophrenia: Secondary | ICD-10-CM | POA: Diagnosis present

## 2017-03-15 DIAGNOSIS — F209 Schizophrenia, unspecified: Secondary | ICD-10-CM

## 2017-03-15 DIAGNOSIS — Z9119 Patient's noncompliance with other medical treatment and regimen: Secondary | ICD-10-CM | POA: Diagnosis not present

## 2017-03-15 DIAGNOSIS — F29 Unspecified psychosis not due to a substance or known physiological condition: Secondary | ICD-10-CM | POA: Diagnosis not present

## 2017-03-15 DIAGNOSIS — Z79899 Other long term (current) drug therapy: Secondary | ICD-10-CM | POA: Diagnosis not present

## 2017-03-15 DIAGNOSIS — G4700 Insomnia, unspecified: Secondary | ICD-10-CM | POA: Diagnosis not present

## 2017-03-15 LAB — COMPREHENSIVE METABOLIC PANEL
ALT: 10 U/L — AB (ref 14–54)
AST: 19 U/L (ref 15–41)
Albumin: 4.2 g/dL (ref 3.5–5.0)
Alkaline Phosphatase: 60 U/L (ref 38–126)
Anion gap: 8 (ref 5–15)
BILIRUBIN TOTAL: 0.3 mg/dL (ref 0.3–1.2)
BUN: 16 mg/dL (ref 6–20)
CALCIUM: 8.7 mg/dL — AB (ref 8.9–10.3)
CO2: 26 mmol/L (ref 22–32)
CREATININE: 1.02 mg/dL — AB (ref 0.44–1.00)
Chloride: 104 mmol/L (ref 101–111)
GFR calc non Af Amer: >60 mL/min (ref >60)
Glucose, Bld: 168 mg/dL — ABNORMAL HIGH (ref 65–99)
Potassium: 3.7 mmol/L (ref 3.5–5.1)
Sodium: 138 mmol/L (ref 135–145)
TOTAL PROTEIN: 7.4 g/dL (ref 6.5–8.1)

## 2017-03-15 LAB — RAPID URINE DRUG SCREEN, HOSP PERFORMED
AMPHETAMINES: NOT DETECTED
Barbiturates: NOT DETECTED
Benzodiazepines: NOT DETECTED
Cocaine: NOT DETECTED
Opiates: NOT DETECTED
Tetrahydrocannabinol: NOT DETECTED

## 2017-03-15 LAB — CBC
HCT: 38.4 % (ref 36.0–46.0)
Hemoglobin: 13.1 g/dL (ref 12.0–15.0)
MCH: 28 pg (ref 26.0–34.0)
MCHC: 34.1 g/dL (ref 30.0–36.0)
MCV: 82.1 fL (ref 78.0–100.0)
PLATELETS: 230 10*3/uL (ref 150–400)
RBC: 4.68 MIL/uL (ref 3.87–5.11)
RDW: 13.1 % (ref 11.5–15.5)
WBC: 5.6 10*3/uL (ref 4.0–10.5)

## 2017-03-15 LAB — SALICYLATE LEVEL: Salicylate Lvl: <7 mg/dL (ref 2.8–30.0)

## 2017-03-15 LAB — ETHANOL: Alcohol, Ethyl (B): <5 mg/dL (ref <5)

## 2017-03-15 LAB — ACETAMINOPHEN LEVEL: Acetaminophen (Tylenol), Serum: <10 ug/mL — ABNORMAL LOW (ref 10–30)

## 2017-03-15 LAB — HCG, QUANTITATIVE, PREGNANCY: HCG, BETA CHAIN, QUANT, S: 4 m[IU]/mL (ref <5)

## 2017-03-15 NOTE — ED Notes (Signed)
Pt admitted to room #41. Pt presents with paranoia, delusional. Pt referencing "the government" during assessment. Pt reports "my family set me up for conspiracy." Pt reports wanting to harm family. Pt denies SI,AVH. "I'm not schizophrenia, I'm healthy." Pt reports she does not take medication. Pt irritable at times. Encouragement and support provided. Special checks q 15 mins in place for safety,  Video monitoring in place. Will continue to monitor.

## 2017-03-15 NOTE — ED Notes (Signed)
Pt sitting on bed calm and cooperative. ?

## 2017-03-15 NOTE — ED Provider Notes (Signed)
[facility]-EMERGENCY DEPT Provider Note   CSN: [id number] Arrival date & time: 03/15/17  1415     History   Chief Complaint Chief Complaint  Patient presents with  . Medical Clearance    HPI [name] is a 56 y.o. female.  She presents for evaluation of altered behavior, chronically not taking her medications, and being verbally aggressive with family members.  They also witnessed her talking to someone "who was not there."  Because of these changes, the family members initiated involuntary commitment.  The patient was brought here by law enforcement, who served her with the petition.  The patient tells me that there is a "conspiracy against me, to keep me in my house."  She states that her family members are part of the conspiracy.  She denies recent illnesses including chest pain, cough, fever, weakness or dizziness.  There are no other known modifying factors.  HPI  Past Medical History:  Diagnosis Date  . Non compliance w medication regimen   . Schizophrenia [facility])     Patient Active Problem List   Diagnosis Date Noted  . Noncompliance with diet and medication regimen   . Psychoses   . Hypokalemic alkalosis 11/11/2011  . Medically noncompliant 11/11/2011  . Schizophrenia, paranoid (HCC) 11/07/2011    History reviewed. No pertinent surgical history.  OB History    No data available       Home Medications    Prior to Admission medications   Not on File    Family History History reviewed. No pertinent family history.  Social History Social History  Substance Use Topics  . Smoking status: Current Every Day Smoker  . Smokeless tobacco: Not on file  . Alcohol use Yes     Comment: Refuses to disclose how much     Allergies   Aspirin and Geodon [ziprasidone hcl]   Review of Systems Review of Systems  All other systems reviewed and are negative.    Physical Exam Updated Vital Signs BP 118/66 (BP Location: Left Arm)   Pulse 79   Temp 98.3 F (36.8  C) (Oral)   Resp 18   SpO2 95%   Physical Exam  Constitutional: She is oriented to person, place, and time. She appears well-developed and well-nourished. No distress.  HENT:  Head: Normocephalic and atraumatic.  Eyes: Pupils are equal, round, and reactive to light. Conjunctivae and EOM are normal.  Neck: Normal range of motion and phonation normal. Neck supple.  Cardiovascular: Normal rate and regular rhythm.   Pulmonary/Chest: Effort normal and breath sounds normal. She exhibits no tenderness.  Abdominal: Soft. She exhibits no distension. There is no tenderness. There is no guarding.  Musculoskeletal: Normal range of motion.  Neurological: She is alert and oriented to person, place, and time. She exhibits normal muscle tone.  Skin: Skin is warm and dry.  Psychiatric:  Agitated, dismissive, distracted.  Not clearly responding to internal stimuli, or having active hallucination.  Nursing note and vitals reviewed.    ED Treatments / Results  Labs (all labs ordered are listed, but only abnormal results are displayed) Labs Reviewed  COMPREHENSIVE METABOLIC PANEL - Abnormal; Notable for the following:       Result Value   Glucose, Bld 168 (*)    Creatinine, Ser 1.02 (*)    Calcium 8.7 (*)    ALT 10 (*)    All other components within normal limits  ACETAMINOPHEN LEVEL - Abnormal; Notable for the following:    Acetaminophen (Tylenol), Serum <10 (*)  All other components within normal limits  ETHANOL  SALICYLATE LEVEL  CBC  RAPID URINE DRUG SCREEN, HOSP PERFORMED    EKG  EKG Interpretation None       Radiology No results found.  Procedures Procedures (including critical care time)  Medications Ordered in ED Medications - No data to display   Initial Impression / Assessment and Plan / ED Course  I have reviewed the triage vital signs and the nursing notes.  Pertinent labs & imaging results that were available during my care of the patient were reviewed by me  and considered in my medical decision making (see chart for details).  Clinical Course as of Mar 16 1547  Mon Mar 15, 2017  1547 Mild elevation of glucose on a nonfasting sample, doubt unstable diabetic condition.  The patient is medically cleared for treatment by psychiatry.  [EW]    Clinical Course User Index [EW] Mancel Bale, MD     Patient Vitals for the past 24 hrs:  BP Temp Temp src Pulse Resp SpO2  03/15/17 1454 118/66 98.3 F (36.8 C) Oral 79 18 95 %    TTS consult     Final Clinical Impressions(s) / ED Diagnoses   Final diagnoses:  Schizophrenia, unspecified type (HCC)     Recurrent psychosis, likely associated with medication noncompliance.  Nursing Notes Reviewed/ Care Coordinated Applicable Imaging Reviewed Interpretation of Laboratory Data incorporated into ED treatment   Plan-as per TTS in conjunction with oncoming provider team   New Prescriptions New Prescriptions   No medications on file     Mancel Bale, MD 03/15/17 [phone]

## 2017-03-15 NOTE — ED Triage Notes (Signed)
Patient states she has been "set up" by the government and legislative. She became verbally angry with all her family.

## 2017-03-15 NOTE — ED Notes (Signed)
Bed: WA31 Expected date:  Expected time:  Means of arrival:  Comments: 

## 2017-03-15 NOTE — BH Assessment (Addendum)
Assessment Note  [name] is an 56 y.o. female that presents this date under IVC. Per IVC, respondent has been diagnosed with schizophrenia and chronic psychosis. Per IVC patient is non-compliant with current medications and has had multiple admissions to address her MH issues. Patient was previously committed to the [place] [facility] in [place] on 03/2016. Patient is talking to herself and is responding to internal stimuli getting angry at imaginary people. She has made threats to her family and wishes her brother Ramone was dead. This writer attempted to assess patient this date unsuccessfully. Patient voices concerns that this writer is the individual who has "committed crimes against her." Patient cannot be redirected and displays active thought blocking. Patient is very fixated on the government conspiring against her. Information for purposes of assessment was obtained from prior admission notes and history. Per notes, patient presents with paranoia, delusional. Patient referencing "the government" during assessment. Patient reports "my family set me up for conspiracy." Patient reports wanting to harm family. Patient denies SI, AVH. "I'm not schizophrenia, I'm healthy." Patient reports she does not take medication. Pt irritable at times. Admission note stated, patient presents for evaluation of altered behavior, chronically not taking her medications, and being verbally aggressive with family members. They also witnessed her talking to someone "who was not there."  Because of these changes, the family members initiated involuntary commitment. The patient was brought here by law enforcement, who served her with the petition. The patient voices that there is a "conspiracy against me, to keep me in my house." She states that her family members are part of the conspiracy. Case was staffed with Shaune Pollack DNP who recommended a inpatient admission as appropriate bed placement is investigated.   Diagnosis:  Schizophrenia  Past Medical History:  Past Medical History:  Diagnosis Date  . Non compliance w medication regimen   . Schizophrenia (HCC)     History reviewed. No pertinent surgical history.  Family History: History reviewed. No pertinent family history.  Social History:  reports that she has been smoking.  She does not have any smokeless tobacco history on file. She reports that she drinks alcohol. She reports that she does not use drugs.  Additional Social History:  Alcohol / Drug Use Pain Medications: See MAR Prescriptions: See MAR Over the Counter: See MAR History of alcohol / drug use?:  (UTA) Longest period of sobriety (when/how long):  (UTA) Negative Consequences of Use:  (UTA) Withdrawal Symptoms:  (UTA)  CIWA: CIWA-Ar BP: (!) 112/55 (nurse was notified) Pulse Rate: 68 COWS:    Allergies:  Allergies  Allergen Reactions  . Aspirin Other (See Comments)    Pt refused to state reaction. Pt's sister states its an allergy and unsure of reaction.  [name] [Ziprasidone Hcl]     Other reaction(s): Other (See Comments) Body movements [facility]    Home Medications:  (Not in a hospital admission)  OB/GYN Status:  No LMP recorded. Patient is postmenopausal.  General Assessment Data [facility] of Assessment: [facility] TTS Assessment: In system Is this a Tele or Face-to-Face Assessment?: Face-to-Face Is this an Initial Assessment or a Re-assessment for this encounter?: Initial Assessment Marital status: Married [place] name: NA Is patient pregnant?: No Pregnancy Status: No Living Arrangements: Other relatives Can pt return to current living arrangement?: Yes Admission Status: Involuntary Is patient capable of signing voluntary admission?: No Referral Source: Self/Family/Friend Insurance type: MCR  Medical Screening Exam [facility] Walk-in ONLY) Medical Exam completed: Yes  Crisis Care Plan Living Arrangements: Other relatives Legal Guardian:  Other: Name of Psychiatrist: UTA Name of Therapist: UTA  Education Status Is patient currently in school?: No Current Grade:  (NA) Highest grade of school patient has completed:  (UTA) Name of school:  (NA) Contact person:  (NA)  Risk to self with the past 6 months Suicidal Ideation: No Has patient been a risk to self within the past 6 months prior to admission? : No Suicidal Intent: No Has patient had any suicidal intent within the past 6 months prior to admission? : No Is patient at risk for suicide?: No Suicidal Plan?: No Has patient had any suicidal plan within the past 6 months prior to admission? : No Access to Means: No What has been your use of drugs/alcohol within the last 12 months?: Denies Previous Attempts/Gestures: No (Per note review) How many times?: 0 Other Self Harm Risks: UTA Triggers for Past Attempts: Unknown Intentional Self Injurious Behavior: None (Per IVC) Family Suicide History: No (Per note review) Recent stressful life event(s):  (UTA) Persecutory voices/beliefs?:  Rich Reining) Depression:  (UTA) Depression Symptoms:  (UTA) Substance abuse history and/or treatment for substance abuse?: No (Per notes)  Risk to Others within the past 6 months Homicidal Ideation: No Does patient have any lifetime risk of violence toward others beyond the six months prior to admission? : No Thoughts of Harm to Others: No Current Homicidal Intent: No Current Homicidal Plan: No Access to Homicidal Means: No Identified Victim: NA History of harm to others?: No Assessment of Violence: None Noted Violent Behavior Description: NA Does patient have access to weapons?: No Criminal Charges Pending?: No Does patient have a court date: No Is patient on probation?: No  Psychosis Hallucinations: None noted Delusions: None noted  Mental Status Report Appearance/Hygiene: In scrubs Eye Contact: Fair Motor Activity: Agitation Speech: Argumentative Level of Consciousness:  Irritable Mood: Anxious, Angry Affect: Irritable Anxiety Level: Moderate Thought Processes: Flight of Ideas Judgement: Unimpaired Orientation:  (UTA) Obsessive Compulsive Thoughts/Behaviors: Unable to Assess  Cognitive Functioning Concentration: Unable to Assess Memory: Unable to Assess IQ:  (UTA) Insight: Unable to Assess Impulse Control: Unable to Assess Appetite:  (UTA) Weight Loss:  (UTA) Weight Gain:  (UTA) Sleep:  (UTA) Total Hours of Sleep:  (UTA) Vegetative Symptoms:  (UTA)  ADLScreening [facility] Assessment Services) Patient's cognitive ability adequate to safely complete daily activities?: No Patient able to express need for assistance with ADLs?: Yes Independently performs ADLs?: Yes (appropriate for developmental age)  Prior Inpatient Therapy Prior Inpatient Therapy: Yes Prior Therapy Dates: 2017 Prior Therapy Facilty/Provider(s): [facility] Reason for Treatment: MH issues  Prior Outpatient Therapy Prior Outpatient Therapy: Yes Prior Therapy Dates:  (Per notes ongoing) Prior Therapy Facilty/Provider(s): [facility] Reason for Treatment: MH issues Does patient have an ACCT team?: Unknown Does patient have Intensive In-House Services?  : Unknown Does patient have [facility] services? : Yes Does patient have P4CC services?: No  ADL Screening (condition at time of admission) Patient's cognitive ability adequate to safely complete daily activities?: No Is the patient deaf or have difficulty hearing?: No Does the patient have difficulty seeing, even when wearing glasses/contacts?: No Does the patient have difficulty concentrating, remembering, or making decisions?: Yes Patient able to express need for assistance with ADLs?: Yes Does the patient have difficulty dressing or bathing?: No Independently performs ADLs?: Yes (appropriate for developmental age) Does the patient have difficulty walking or climbing stairs?: No Weakness of Legs: None Weakness of Arms/Hands: None  Home  Assistive Devices/Equipment Home  Assistive Devices/Equipment: None  Therapy Consults (therapy consults require a physician order) PT Evaluation Needed: No OT Evalulation Needed: No SLP Evaluation Needed: No Abuse/Neglect Assessment (Assessment to be complete while patient is alone) Physical Abuse: Denies Verbal Abuse: Denies Sexual Abuse: Denies Exploitation of patient/patient's resources: Denies Self-Neglect: Denies Values / Beliefs Cultural Requests During Hospitalization: None Spiritual Requests During Hospitalization: None Consults Spiritual Care Consult Needed: No Social Work Consult Needed: No Merchant navy officer (For Healthcare) Does Patient Have a Medical Advance Directive?: No Would patient like information on creating a medical advance directive?: No - Patient declined    Additional Information 1:1 In Past 12 Months?: No CIRT Risk: No Elopement Risk: No Does patient have medical clearance?: Yes     Disposition: Case was staffed with Shaune Pollack DNP who recommended a inpatient admission as appropriate bed placement is investigated.   Disposition Initial Assessment Completed for this Encounter: Yes Disposition of Patient: Inpatient treatment program Type of inpatient treatment program: Adult  On Site Evaluation by:   Reviewed with Physician:    Alfredia Ferguson 03/15/2017 6:34 PM

## 2017-03-15 NOTE — BH Assessment (Signed)
[facility]HH Assessment Progress Note  Case was staffed with Lord DNP who recommended a inpatient admission as appropriate bed placement is investigated.         

## 2017-03-15 NOTE — ED Notes (Signed)
Lab called and needed a repeat urine because there was no label,Notified Jacqueline,T in TCU.

## 2017-03-16 ENCOUNTER — Emergency Department (HOSPITAL_COMMUNITY): Payer: Medicare Other

## 2017-03-16 DIAGNOSIS — F29 Unspecified psychosis not due to a substance or known physiological condition: Secondary | ICD-10-CM | POA: Diagnosis not present

## 2017-03-16 DIAGNOSIS — F200 Paranoid schizophrenia: Secondary | ICD-10-CM

## 2017-03-16 DIAGNOSIS — Z79899 Other long term (current) drug therapy: Secondary | ICD-10-CM | POA: Diagnosis not present

## 2017-03-16 DIAGNOSIS — G4700 Insomnia, unspecified: Secondary | ICD-10-CM | POA: Diagnosis not present

## 2017-03-16 DIAGNOSIS — Z9114 Patient's other noncompliance with medication regimen: Secondary | ICD-10-CM

## 2017-03-16 LAB — URINALYSIS, MICROSCOPIC (REFLEX): RBC / HPF: NONE SEEN RBC/hpf (ref 0–5)

## 2017-03-16 LAB — URINALYSIS, ROUTINE W REFLEX MICROSCOPIC
Bilirubin Urine: NEGATIVE
GLUCOSE, UA: NEGATIVE mg/dL
Nitrite: NEGATIVE
PH: 6 (ref 5.0–8.0)
Protein, ur: NEGATIVE mg/dL
SPECIFIC GRAVITY, URINE: 1.01 (ref 1.005–1.030)

## 2017-03-16 MED ORDER — RISPERIDONE 1 MG PO TBDP
1.0000 mg | ORAL_TABLET | Freq: Two times a day (BID) | ORAL | Status: DC
  Filled 2017-03-16 (×3): qty 1

## 2017-03-16 MED ORDER — TRAZODONE HCL 100 MG PO TABS: 100.0000 mg | ORAL_TABLET | Freq: Every evening | ORAL | Status: DC | PRN

## 2017-03-16 MED ORDER — LAMOTRIGINE 25 MG PO TABS: 25.0000 mg | ORAL_TABLET | Freq: Every day | ORAL | Status: DC

## 2017-03-16 NOTE — ED Notes (Signed)
Introduced self to patient. Pt oriented to unit expectations.  Assessed pt for:  A) Anxiety &/or agitation: Pt sits on her bed, staring at the wall. She told Dr. Jannifer Franklin that people are trying to force her into a government conspiracy. She is loud and irritable. She said, "I see the life leaving me."  When asked she denies AVH. If not asked questions, she does not talk.   S) Safety: Safety maintained with q-15-minute checks and hourly rounds by staff.  A) ADLs: Pt able to perform ADLs independently.  P) Pick-Up (room cleanliness): Pt's room clean and free of clutter.

## 2017-03-16 NOTE — ED Notes (Signed)
Patient seen in her room awake. Present with flat affect. Unwilling to cooperate with this Clinical research associate for assessment. Patient just stated "I AM FINE. I understand am to be evaluated by the dr in the morning".  Staff offered support and encouraged patient to verbalize needs to staff. Routine safety checks maintained. Will continue to monitor patient.  Patient remains safe on unit

## 2017-03-16 NOTE — ED Notes (Signed)
Pt requested supplies to take a shower. She is pleasant, but not cooperative with care plan. Again refused chest x-ray when technician arrived with portable equipment, and refused EKG again. She took a shower.

## 2017-03-16 NOTE — ED Notes (Signed)
Pt refused offer of snack and refused medications. She was pleasant in her refusal.

## 2017-03-16 NOTE — BH Assessment (Signed)
BHH Assessment Progress Note  Per Thedore Mins, MD, this pt requires psychiatric hospitalization at this time.  Pt presents under IVC initiated by he sister, which EDP Mancel Bale, MD has upheld.  At 15:13 [facility] calls from [facility] to report that pt has been accepted to their facility by Dr Estill Cotta.  They will be ready to receive pt at 10:00 tomorrow, 03/17/2017.  Hillery Jacks, NP concurs with this decision.  Pt's nurse, Diane, has been notified, and agrees to call report to [phone] when the time comes.  Pt is to be transported via [facility].  Doylene Canning, MA Triage Specialist [phone]

## 2017-03-16 NOTE — Progress Notes (Signed)
03/16/17 1351:  LRT went to pt room to offer activities, pt declined.   Kamelia Lampkins, LRT/CTRS 

## 2017-03-16 NOTE — Consult Note (Signed)
[facility] Consult   Reason for Consult:  Schizophrenia, Paranoid Referring Physician:  EPD Patient Identification: [name] MRN:  [id number] Principal Diagnosis: Schizophrenia, paranoid ([place]) Diagnosis:   Patient Active Problem List   Diagnosis Date Noted  . Noncompliance with diet and medication regimen [Z91.11, Z91.14]   . Psychoses [F29]   . Hypokalemic alkalosis [E87.3] 11/11/2011  . Medically noncompliant [Z91.19] 11/11/2011  . Schizophrenia, paranoid ([place]) [F20.0] 11/07/2011    Total Time spent with patient: 30 minutes  Subjective:   [name] is a 56 y.o. female seen with MD and treatment team. Patient presents with delusion and paranoia to reports [name] is awake and alert. Patient appears irritated and agitated with assessment question.  Patient continues to reports "conspiracy with the government". Noted patient current diagnoses of  schizophrenia with psychosis. Patient denies that she is taken or prescribed  any medication. [name] appears to be responding to internal stimuli. Per RN patient is isolated to room. Support, encouragement and reassurance was provided.   HPI: per tele-assessment-[name] is an 56 y.o. female that presents this date under IVC. Per IVC, respondent has been diagnosed with schizophrenia and chronic psychosis. Per IVC patient is non-compliant with current medications and has had multiple admissions to address her [place] issues. Patient was previously committed to the [place] [facility] in [place] on 03/2016. Patient is talking to herself and is responding to internal stimuli getting angry at imaginary people. She has made threats to her family and wishes her brother [name] was dead. This writer attempted to assess patient this date unsuccessfully. Patient voices concerns that this writer is the individual who has "committed crimes against her." Patient cannot be redirected and displays active thought blocking. Patient is very fixated on  the government conspiring against her. Information for purposes of assessment was obtained from prior admission notes and history. Per notes, patient presents with paranoia, delusional. Patient referencing "the government" during assessment. Patient reports "my family set me up for conspiracy." Patient reports wanting to harm family. Patient denies SI, AVH. "I'm not schizophrenia, I'm healthy." Patient reports she does not take medication. Pt irritable at times. Admission note stated, patient presents for evaluation of altered behavior, chronically not taking her medications, and being verbally aggressive with family members. They also witnessed her talking to someone "who was not there." Because of these changes, the family members initiated involuntary commitment. The patient was brought here by law enforcement, who served her with the petition. The patient voices that there is a "conspiracy against me, to keep me in my house." She states that her family members are part of the conspiracy. Case was staffed with Paula Cliche DNP who recommended a inpatient admission as appropriate bed placement is investigated.   Past Psychiatric History:   Risk to Self: Suicidal Ideation: No Suicidal Intent: No Is patient at risk for suicide?: No Suicidal Plan?: No Access to Means: No What has been your use of drugs/alcohol within the last 12 months?: Denies How many times?: 0 Other Self Harm Risks: UTA Triggers for Past Attempts: Unknown Intentional Self Injurious Behavior: None (Per IVC) Risk to Others: Homicidal Ideation: No Thoughts of Harm to Others: No Current Homicidal Intent: No Current Homicidal Plan: No Access to Homicidal Means: No Identified Victim: NA History of harm to others?: No Assessment of Violence: None Noted Violent Behavior Description: NA Does patient have access to weapons?: No Criminal Charges Pending?: No Does patient have a court date: No Prior Inpatient Therapy: Prior Inpatient  Therapy: Yes Prior Therapy Dates: 2017 Prior Therapy Facilty/Provider(s): [facility] Reason for Treatment: MH issues Prior Outpatient Therapy: Prior Outpatient Therapy: Yes Prior Therapy Dates:  (Per notes ongoing) Prior Therapy Facilty/Provider(s): [facility] Reason for Treatment: MH issues Does patient have an ACCT team?: Unknown Does patient have Intensive In-House Services?  : Unknown Does patient have [facility] services? : Yes Does patient have P4CC services?: No  Past Medical History:  Past Medical History:  Diagnosis Date  . Non compliance w medication regimen   . Schizophrenia ([place])    History reviewed. No pertinent surgical history. Family History: History reviewed. No pertinent family history. Family Psychiatric  History:  Social History:  History  Alcohol Use  . Yes    Comment: Refuses to disclose how much     History  Drug Use No    Comment: Refuses to answer    Social History   Social History  . Marital status: Single    Spouse name: [name]  . Number of children: [name]  . Years of education: [name]   Social History Main Topics  . Smoking status: Current Every Day Smoker  . Smokeless tobacco: None  . Alcohol use Yes     Comment: Refuses to disclose how much  . Drug use: No     Comment: Refuses to answer  . Sexual activity: Not Asked     Comment: refused to answer   Other Topics Concern  . None   Social History Narrative  . None   Additional Social History:    Allergies:   Allergies  Allergen Reactions  . Aspirin Other (See Comments)    Pt refused to state reaction. Pt's sister states its an allergy and unsure of reaction.  Lindajo Royal [Ziprasidone Hcl]     Other reaction(s): Other (See Comments) Body movements Listed in [facility] from [facility]    Labs:  Results for orders placed or performed during the hospital encounter of 03/15/17 (from the past 48 hour(s))  Comprehensive metabolic panel     Status: Abnormal   Collection Time: 03/15/17  2:35 PM   Result Value Ref Range   Sodium 138 135 - 145 mmol/L   Potassium 3.7 3.5 - 5.1 mmol/L   Chloride 104 101 - 111 mmol/L   CO2 26 22 - 32 mmol/L   Glucose, Bld 168 (H) 65 - 99 mg/dL   BUN 16 6 - 20 mg/dL   Creatinine, Ser 1.02 (H) 0.44 - 1.00 mg/dL   Calcium 8.7 (L) 8.9 - 10.3 mg/dL   Total Protein 7.4 6.5 - 8.1 g/dL   Albumin 4.2 3.5 - 5.0 g/dL   AST 19 15 - 41 U/L   ALT 10 (L) 14 - 54 U/L   Alkaline Phosphatase 60 38 - 126 U/L   Total Bilirubin 0.3 0.3 - 1.2 mg/dL   GFR calc non Af Amer >60 >60 mL/min   GFR calc Af Amer >60 >60 mL/min    Comment: (NOTE) The eGFR has been calculated using the CKD EPI equation. This calculation has not been validated in all clinical situations. eGFR's persistently <60 mL/min signify possible Chronic Kidney Disease.    Anion gap 8 5 - 15  Ethanol     Status: None   Collection Time: 03/15/17  2:35 PM  Result Value Ref Range   Alcohol, Ethyl (B) <5 <5 mg/dL    Comment:        LOWEST DETECTABLE LIMIT FOR SERUM ALCOHOL IS 5 mg/dL FOR MEDICAL PURPOSES ONLY  Salicylate level     Status: None   Collection Time: 03/15/17  2:35 PM  Result Value Ref Range   Salicylate Lvl <7.8 2.8 - 30.0 mg/dL  Acetaminophen level     Status: Abnormal   Collection Time: 03/15/17  2:35 PM  Result Value Ref Range   Acetaminophen (Tylenol), Serum <10 (L) 10 - 30 ug/mL    Comment:        THERAPEUTIC CONCENTRATIONS VARY SIGNIFICANTLY. A RANGE OF 10-30 ug/mL MAY BE AN EFFECTIVE CONCENTRATION FOR MANY PATIENTS. HOWEVER, SOME ARE BEST TREATED AT CONCENTRATIONS OUTSIDE THIS RANGE. ACETAMINOPHEN CONCENTRATIONS >150 ug/mL AT 4 HOURS AFTER INGESTION AND >50 ug/mL AT 12 HOURS AFTER INGESTION ARE OFTEN ASSOCIATED WITH TOXIC REACTIONS.   cbc     Status: None   Collection Time: 03/15/17  2:35 PM  Result Value Ref Range   WBC 5.6 4.0 - 10.5 K/uL   RBC 4.68 3.87 - 5.11 MIL/uL   Hemoglobin 13.1 12.0 - 15.0 g/dL   HCT 38.4 36.0 - 46.0 %   MCV 82.1 78.0 - 100.0 fL    MCH 28.0 26.0 - 34.0 pg   MCHC 34.1 30.0 - 36.0 g/dL   RDW 13.1 11.5 - 15.5 %   Platelets 230 150 - 400 K/uL  hCG, quantitative, pregnancy     Status: None   Collection Time: 03/15/17  2:35 PM  Result Value Ref Range   hCG, Beta Chain, Quant, S 4 <5 mIU/mL    Comment:          GEST. AGE      CONC.  (mIU/mL)   <=1 WEEK        5 - 50     2 WEEKS       50 - 500     3 WEEKS       100 - 10,000     4 WEEKS     1,000 - 30,000     5 WEEKS     3,500 - 115,000   6-8 WEEKS     12,000 - 270,000    12 WEEKS     15,000 - 220,000        FEMALE AND NON-PREGNANT FEMALE:     LESS THAN 5 mIU/mL   Rapid urine drug screen (hospital performed)     Status: None   Collection Time: 03/15/17  3:23 PM  Result Value Ref Range   Opiates NONE DETECTED NONE DETECTED   Cocaine NONE DETECTED NONE DETECTED   Benzodiazepines NONE DETECTED NONE DETECTED   Amphetamines NONE DETECTED NONE DETECTED   Tetrahydrocannabinol NONE DETECTED NONE DETECTED   Barbiturates NONE DETECTED NONE DETECTED    Comment:        DRUG SCREEN FOR MEDICAL PURPOSES ONLY.  IF CONFIRMATION IS NEEDED FOR ANY PURPOSE, NOTIFY LAB WITHIN 5 DAYS.        LOWEST DETECTABLE LIMITS FOR URINE DRUG SCREEN Drug Class       Cutoff (ng/mL) Amphetamine      1000 Barbiturate      200 Benzodiazepine   295 Tricyclics       621 Opiates          300 Cocaine          300 THC              50   Urinalysis, Routine w reflex microscopic     Status: Abnormal   Collection Time: 03/16/17 10:35 AM  Result Value Ref Range  Color, Urine YELLOW YELLOW   APPearance CLEAR CLEAR   Specific Gravity, Urine 1.010 1.005 - 1.030   pH 6.0 5.0 - 8.0   Glucose, UA NEGATIVE NEGATIVE mg/dL   Hgb urine dipstick SMALL (A) NEGATIVE   Bilirubin Urine NEGATIVE NEGATIVE   Ketones, ur TRACE (A) NEGATIVE mg/dL   Protein, ur NEGATIVE NEGATIVE mg/dL   Nitrite NEGATIVE NEGATIVE   Leukocytes, UA TRACE (A) NEGATIVE  Urinalysis, Microscopic (reflex)     Status: Abnormal    Collection Time: 03/16/17 10:35 AM  Result Value Ref Range   RBC / HPF NONE SEEN 0 - 5 RBC/hpf   WBC, UA 0-5 0 - 5 WBC/hpf   Bacteria, UA RARE (A) NONE SEEN   Squamous Epithelial / LPF 0-5 (A) NONE SEEN    Current Facility-Administered Medications  Medication Dose Route Frequency Provider Last Rate Last Dose  . lamoTRIgine (LAMICTAL) tablet 25 mg  25 mg Oral Daily Marieelena Bartko, MD      . risperiDONE (RISPERDAL M-TABS) disintegrating tablet 1 mg  1 mg Oral BID Zuleyma Scharf, MD      . traZODone (DESYREL) tablet 100 mg  100 mg Oral QHS PRN Corena Pilgrim, MD       No current outpatient prescriptions on file.    Musculoskeletal: Strength & Muscle Tone: within normal limits Gait & Station: normal Patient leans: [name]  Psychiatric Specialty Exam: Physical Exam  ROS  Blood pressure 98/67, pulse (!) 59, temperature 98.4 F (36.9 C), temperature source Oral, resp. rate 17, SpO2 100 %.There is no height or weight on file to calculate BMI.  General Appearance: Disheveled and Guarded  Eye Contact:  Minimal  Speech:  Pressured  Volume:  fluctuation  Mood:  Anxious, Depressed and Irritable  Affect:  Blunt and Labile  Thought Process:  Disorganized  Orientation:  Other:  person and place  Thought Content:  Hallucinations: Auditory and Paranoid Ideation  Suicidal Thoughts:  No  Homicidal Thoughts:  No  Memory:  Immediate;   Poor Recent;   Fair Remote;   Poor  Judgement:  Impaired  Insight:  Lacking  Psychomotor Activity:  Restlessness  Concentration:  Concentration: Poor  Recall:  Poor  Fund of Knowledge:  Poor  Language:  Fair  Akathisia:  No  Handed:  Right  AIMS (if indicated):     Assets:  Desire for Improvement Social Support  ADL's:  Intact  Cognition:    Sleep:        Treatment Plan Summary: Daily contact with patient to assess and evaluate symptoms and progress in treatment and Medication management   Continue Lamictal 25 mg and Risperdal 1 mg for mood  stabilization Continue  Trazodone 100 mg for insomnia  -TTS to continue seeking inpatient   Disposition: Recommend psychiatric Inpatient admission when medically cleared.  Orders placed for EKG's, Urinalysis and Chest X-Ray    Paula [place], NP 03/16/2017 1:14 PM  Patient seen face-to-face for psychiatric evaluation, chart reviewed and case discussed with the physician extender and developed treatment plan. Reviewed the information documented and agree with the treatment plan. Corena Pilgrim, MD

## 2017-03-16 NOTE — ED Notes (Signed)
Pt has a guardian, name [name].

## 2017-03-16 NOTE — ED Notes (Signed)
Pt refused EKG and said that she does not need a chest x-ray. She wants to go home.

## 2017-03-17 DIAGNOSIS — F259 Schizoaffective disorder, unspecified: Secondary | ICD-10-CM | POA: Diagnosis not present

## 2017-03-17 DIAGNOSIS — F200 Paranoid schizophrenia: Secondary | ICD-10-CM | POA: Diagnosis present

## 2017-03-17 NOTE — ED Notes (Signed)
Patient is calm and cooperative at this time. Patient denies SI,HI and AVH at this time. Plan of care dicussed with patient. Encouragement and support provided and safety maintain. Q 15 min safety checks remain in place and video monitoring.

## 2017-03-17 NOTE — ED Notes (Signed)
[facility] department contacted for patient transport to [facility] after 10 am.

## 2017-11-17 ENCOUNTER — Other Ambulatory Visit: Payer: Self-pay

## 2017-11-17 ENCOUNTER — Emergency Department (HOSPITAL_COMMUNITY)
Admission: EM | Admit: 2017-11-17 | Discharge: 2017-11-26 | Disposition: A | Discharge status: Other Acute Inpatient Hospital | Payer: Medicare Other | Attending: Emergency Medicine | Admitting: Emergency Medicine

## 2017-11-17 ENCOUNTER — Encounter (HOSPITAL_COMMUNITY): Payer: Self-pay

## 2017-11-17 DIAGNOSIS — R638 Other symptoms and signs concerning food and fluid intake: Secondary | ICD-10-CM | POA: Diagnosis not present

## 2017-11-17 DIAGNOSIS — R451 Restlessness and agitation: Secondary | ICD-10-CM | POA: Diagnosis not present

## 2017-11-17 DIAGNOSIS — F17200 Nicotine dependence, unspecified, uncomplicated: Secondary | ICD-10-CM | POA: Insufficient documentation

## 2017-11-17 DIAGNOSIS — Z046 Encounter for general psychiatric examination, requested by authority: Secondary | ICD-10-CM | POA: Diagnosis not present

## 2017-11-17 DIAGNOSIS — Z9114 Patient's other noncompliance with medication regimen: Secondary | ICD-10-CM | POA: Diagnosis not present

## 2017-11-17 DIAGNOSIS — F209 Schizophrenia, unspecified: Secondary | ICD-10-CM | POA: Diagnosis not present

## 2017-11-17 DIAGNOSIS — R258 Other abnormal involuntary movements: Secondary | ICD-10-CM | POA: Diagnosis not present

## 2017-11-17 LAB — RAPID URINE DRUG SCREEN, HOSP PERFORMED
Amphetamines: NOT DETECTED
BARBITURATES: NOT DETECTED
Benzodiazepines: NOT DETECTED
COCAINE: NOT DETECTED
OPIATES: NOT DETECTED
TETRAHYDROCANNABINOL: NOT DETECTED

## 2017-11-17 LAB — COMPREHENSIVE METABOLIC PANEL
ALT: 11 U/L — ABNORMAL LOW (ref 14–54)
ANION GAP: 10 (ref 5–15)
AST: 19 U/L (ref 15–41)
Albumin: 4.3 g/dL (ref 3.5–5.0)
Alkaline Phosphatase: 65 U/L (ref 38–126)
BILIRUBIN TOTAL: 0.4 mg/dL (ref 0.3–1.2)
BUN: 16 mg/dL (ref 6–20)
CO2: 23 mmol/L (ref 22–32)
Calcium: 9.2 mg/dL (ref 8.9–10.3)
Chloride: 107 mmol/L (ref 101–111)
Creatinine, Ser: 0.74 mg/dL (ref 0.44–1.00)
Glucose, Bld: 86 mg/dL (ref 65–99)
POTASSIUM: 3.9 mmol/L (ref 3.5–5.1)
Sodium: 140 mmol/L (ref 135–145)
TOTAL PROTEIN: 7.3 g/dL (ref 6.5–8.1)

## 2017-11-17 LAB — I-STAT BETA HCG BLOOD, ED (MC, WL, AP ONLY): I-stat hCG, quantitative: 5.8 m[IU]/mL — ABNORMAL HIGH (ref <5)

## 2017-11-17 LAB — SALICYLATE LEVEL

## 2017-11-17 LAB — ACETAMINOPHEN LEVEL: Acetaminophen (Tylenol), Serum: <10 ug/mL — ABNORMAL LOW (ref 10–30)

## 2017-11-17 LAB — CBC
HCT: 39.8 % (ref 36.0–46.0)
Hemoglobin: 13.2 g/dL (ref 12.0–15.0)
MCH: 28 pg (ref 26.0–34.0)
MCHC: 33.2 g/dL (ref 30.0–36.0)
MCV: 84.5 fL (ref 78.0–100.0)
PLATELETS: 226 10*3/uL (ref 150–400)
RBC: 4.71 MIL/uL (ref 3.87–5.11)
RDW: 13.5 % (ref 11.5–15.5)
WBC: 8.3 10*3/uL (ref 4.0–10.5)

## 2017-11-17 LAB — ETHANOL

## 2017-11-17 MED ORDER — LORAZEPAM 2 MG/ML IJ SOLN
2.0000 mg | Freq: Once | INTRAMUSCULAR | Status: AC
  Administered 2017-11-17: 2 mg via INTRAMUSCULAR
  Filled 2017-11-17: qty 1

## 2017-11-17 NOTE — ED Notes (Signed)
Charge and MD are aware that pt will not let anyone draw her blood. Charge said it was fine for now. Our safety at this time is more appropriate due to pt's behavior.

## 2017-11-17 NOTE — ED Notes (Addendum)
Pt said I am not allowed to draw blood from her. She did let me hook blood pressure up and pulse ox

## 2017-11-17 NOTE — ED Provider Notes (Signed)
[facility] EMERGENCY DEPARTMENT Provider Note   CSN: [id number] Arrival date & time: 11/17/17  1643     History   Chief Complaint Chief Complaint  Patient presents with  . Psychiatric Evaluation    HPI [name] is a 57 y.o. female.  HPI   57 year old female with a history of schizophrenia presents under IVC done by her sister.  IVC states that she has not been taking her medications regularly, has not been sleeping or eating on a regular basis.  She has any eating disorder has lost weight.  She has refused to see a doctor.  She holds conversations with invisible people who are not there, gets angry at them, yells, screams and curses at them.  She is becoming increasingly hostile and confrontational with other people, they report in the respondent's present condition she is a danger to herself and others.  Patient extremely agitated in the emergency department.  She is yelling at staff, and is difficult to assess her.  When asked questions regarding hallucinations, SI, HI, patient just states "lies" I am here for "lies"  Denies acute medical concerns.  Past Medical History:  Diagnosis Date  . Non compliance w medication regimen   . Schizophrenia [facility])     Patient Active Problem List   Diagnosis Date Noted  . Noncompliance with diet and medication regimen   . Psychoses (HCC)   . Hypokalemic alkalosis 11/11/2011  . Medically noncompliant 11/11/2011  . Schizophrenia, paranoid (HCC) 11/07/2011    History reviewed. No pertinent surgical history.   OB History   None      Home Medications    Prior to Admission medications   Not on File    Family History History reviewed. No pertinent family history.  Social History Social History   Tobacco Use  . Smoking status: Current Every Day Smoker  Substance Use Topics  . Alcohol use: Yes    Comment: Refuses to disclose how much  . Drug use: No    Comment: Refuses to answer     Allergies     Aspirin and Geodon [ziprasidone hcl]   Review of Systems Review of Systems  Unable to perform ROS: Psychiatric disorder     Physical Exam Updated Vital Signs BP 116/75   Pulse 76   Resp 16   Ht  (1.626 m)   Wt 68 kg (150 lb)   SpO2 98%   BMI 25.75 kg/m   Physical Exam  Constitutional: She is oriented to person, place, and time. She appears well-developed and well-nourished. No distress.  HENT:  Head: Normocephalic and atraumatic.  Eyes: Conjunctivae and EOM are normal.  Neck: Normal range of motion.  Cardiovascular: Normal rate and regular rhythm.  Pulmonary/Chest: Effort normal. No respiratory distress.  Abdominal: She exhibits no distension.  Musculoskeletal: She exhibits no edema or tenderness.  Neurological: She is alert and oriented to person, place, and time.  Skin: Skin is warm and dry. No rash noted. She is not diaphoretic. No erythema.  Psychiatric: Her affect is angry. She is agitated.  Nursing note and vitals reviewed.    ED Treatments / Results  Labs (all labs ordered are listed, but only abnormal results are displayed) Labs Reviewed  RAPID URINE DRUG SCREEN, HOSP PERFORMED  COMPREHENSIVE METABOLIC PANEL  ETHANOL  SALICYLATE LEVEL  ACETAMINOPHEN LEVEL  CBC  CBG MONITORING, ED  I-STAT BETA HCG BLOOD, ED ([facility], [facility], [facility] ONLY)    EKG None  Radiology No results found.  Procedures Procedures (including critical care time)  Medications Ordered in ED Medications  LORazepam (ATIVAN) injection 2 mg (2 mg Intramuscular Given 11/17/17 1751)     Initial Impression / Assessment and Plan / ED Course  I have reviewed the triage vital signs and the nursing notes.  Pertinent labs & imaging results that were available during my care of the patient were reviewed by me and considered in my medical decision making (see chart for details).     57 year old female with a history of schizophrenia presents under IVC done by her sister.  Patient placed under  IVC by her sister because she had been becoming increasingly agitated, talking to people that were not there.  She is extremely agitated in the emergency department.  She is given IM ativan.  We have been unable to draw blood as patient refusing.  Given she is table medically, feel it is safe to proceed with TTS evaluation without labs at this time and attempt to obtain as she hopefully becomes calmer.  She is allergic to geodon, but has tolerating po risperdal in the past and would consider haldol for increasing agitation.  Labs completed. Patient medically cleared. Hcg 5.8 likely within normal limits or lab aborration for this postmenopausal patient.  Ordered home medications.  TTS evaluated and patient meets inpt criteria.  IVCd with sister and I completed IVC given behaviors in ED, pt responding to internal stimuli, concern she is a threat to herself or others. Placed order for meds she had received during previous admission.  Final Clinical Impressions(s) / ED Diagnoses   Final diagnoses:  Agitation  Involuntary commitment    ED Discharge Orders    None       Alvira Monday, MD 11/18/17 0028

## 2017-11-17 NOTE — ED Triage Notes (Signed)
Pt here with office, pt was IVC by family due to pt cursing out invisible people, being confrontational, not eating and not taking medication. Pt appears to be talking to invisible people in triage.
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
## 2017-11-17 NOTE — ED Notes (Signed)
Pt taken straight back to triage with GSD at side.

## 2017-11-17 NOTE — ED Notes (Signed)
TTS at bedside. 

## 2017-11-17 NOTE — BH Assessment (Signed)
BHH Assessment Progress Note  Spoke with RN to request the tele assessment machine to be placed in room, and she recently got an EMS patient and said that it will be 10  minutes.

## 2017-11-17 NOTE — ED Notes (Signed)
Delay in lab draw,  Pt not compliant and yelling at staff.  Nurse and security at bedside.

## 2017-11-17 NOTE — ED Notes (Signed)
Pt refusing blood draw, changing into scrubs and urine in triage. Pt's POA is her sister [name] and her number is [phone]. Pt goes to [place].

## 2017-11-18 LAB — PREGNANCY, URINE: PREG TEST UR: NEGATIVE

## 2017-11-18 MED ORDER — TRAZODONE HCL 100 MG PO TABS
100.0000 mg | ORAL_TABLET | Freq: Every day | ORAL | Status: DC
  Filled 2017-11-18: qty 1
  Filled 2017-11-18: qty 2

## 2017-11-18 MED ORDER — LORAZEPAM 2 MG/ML IJ SOLN
2.0000 mg | Freq: Once | INTRAMUSCULAR | Status: DC
  Filled 2017-11-18: qty 1

## 2017-11-18 MED ORDER — RISPERIDONE 1 MG PO TABS
1.0000 mg | ORAL_TABLET | Freq: Every day | ORAL | Status: DC
  Filled 2017-11-18 (×2): qty 1

## 2017-11-18 NOTE — Progress Notes (Signed)
Pt is a veteran with full VA benefits.   Disposition CSW has notified April, Access Coordinator with the [facility], [phone]) of patient's status in the [facility] ED.   [facility] [place] has no treatment beds.  Patient is on their waitlist.  Disposition CSW advised to check in daily re:bed availability.   Mental Health Transfer Information Sheet and Physician Certification and Patient Consent for Transfer have been started and are saved in C;/VA forms folder under patient's name. Disposition CSW advised that patient can be referred out to non-VA facilities for acute care treatment.  Pt. meets criteria for inpatient treatment per Donell Sievert, PA  Referred out to the following hospitals:  [facility]  [name]  [facility]  [facility]  [facility]  [facility]  [facility]     Disposition CSW will continue to follow for placement.  Timmothy Euler. Kaylyn Lim, MSW, LCSWA Disposition Clinical Social Work [phone] (cell) [phone] (office)

## 2017-11-18 NOTE — ED Notes (Signed)
Pt has refused breakfast and medicine

## 2017-11-18 NOTE — ED Notes (Addendum)
Pt guardian/POA, [name]-sister, arrived to pod F, ED to complete [facility] admission paperwork. Completed paperwork faxed to [facility].. Copy placed on chart, copy with medical records, and original placed in red folder. Awaiting response from [facility] of when bed is ready.

## 2017-11-18 NOTE — ED Notes (Signed)
Dinner tray arrived, this RN offered and pt refused.

## 2017-11-18 NOTE — ED Notes (Addendum)
RN woke pt to introduce self, pt stated she just wanted to sleep.  Pt denied any needs or concerns at this time.  Pt states she feel safe.  It should be noted that pt did not eat any dinner.

## 2017-11-18 NOTE — ED Notes (Signed)
Pt refused an EKG.  Explained the process and the reasoning for the EKG order to the pt, and she states "I do not want any of that."

## 2017-11-18 NOTE — BHH Counselor (Signed)
Reassessment-Pt denies SI/HI and AVH. Pt's speech was tangential. Pt had flight of ideas.  TTS will continue to seek placement for the Pt.  Wolfgang Phoenix, [facility] Triage Specialist

## 2017-11-18 NOTE — ED Notes (Signed)
Woke pt to ask if she wanted her night time medication, she politely refused and went back to sleep.

## 2017-11-18 NOTE — ED Notes (Signed)
Woke pt again to take vitals which were all stable.  Asked if pt would like her dinner warmed up or a snack.  Pt responded "no ma'ma".  Pt gave some response when asked if she needed another blanket or if there was anything we could do to make her more comfortable.

## 2017-11-18 NOTE — ED Notes (Signed)
Dinner tray ordered.

## 2017-11-18 NOTE — ED Notes (Signed)
Pt continues to refuse medicine and lunch

## 2017-11-18 NOTE — Progress Notes (Addendum)
  CSW spoke with Jonny Ruiz in admissions at [facility]. He requested pt's POA/legal guardian paperwork. CSW spoke with pt's sister [name] (guardian/POA), who faxed documentation directly to [facility]. CSW faxed forms to [facility] and once it was received, CSW spoke with Jonny Ruiz, who faxed consent paperwork to [facility] ED for legal guardian to sign. John stated that once [facility] received consent forms, pt's acceptance information would be provided. CSW notified Meghan RN @ [facility] ED and pt's guardian, who agreed to drive to [facility] ED to sign paperwork within the hour. Megan faxed the signed documentation directly to [facility] for review. When CSW called to receive admission information, Jasmine in admissions at [facility] stated that Jonny Ruiz had left and she "had no idea" there was an admission tonight. Jasmine stated she spoke with her superviser, Joni Reining who told her they would, "deal with things in the morning". CSW asked Leavy Cella several times to speak with her supervisor and she stated that there was no on-call supervisor and she was unable to get in touch with Joni Reining again. CSW reiterated that she had been told the acceptance information would be provided once the consent forms were received and requested she continue attempting to contact her supervisor so we could both better understand the situation. Jasmine began to cry and stated that she felt "threatened" and "scared" of CSW. She stated, "It's our fault...there was a lack of communication". CSW explained that she understood it was not her fault, but it was important that a supervisor, who had a better understanding of what was going on, could enter the conversation to clarify. Jasmine then gave the phone to her coworker, Desirae who stated that she would continue to attempt to reach a supervisor and would call CSW back in 15 min to provide updates. CSW stated that this would be appreciated.   Wells Guiles, LCSW, LCAS Disposition CSW [facility] B[facility]/TTS [phone]  Leavy Cella  contacted CSW back at 8:05pm and stated that, "all of my managers said they would deal with it in the morning" and that Disposition staff would be contacted between 8 and 9am on 11/19/17 by supervisory staff. [facility] ED RN, Victorino Dike, was notified.    Disposition CSW will continue to assist with placement needs.   Wells Guiles, LCSW, LCAS Disposition CSW [facility] B[facility]/TTS [phone]

## 2017-11-19 MED ORDER — ENSURE ENLIVE PO LIQD
237.0000 mL | Freq: Two times a day (BID) | ORAL | Status: DC
  Administered 2017-11-20 – 2017-11-22 (×2): 237 mL via ORAL
  Filled 2017-11-19 (×3): qty 237

## 2017-11-19 NOTE — ED Triage Notes (Signed)
I took ensure to pt . Pt awake and sitting on the side of bed. Pt offer ensure. Pt observed rocking to the Lt and to the Rt . Pt became agitated when I offered the supplement. Pt verbalized while shaking head I don't want that and you can't make

## 2017-11-19 NOTE — ED Triage Notes (Signed)
EDP [place] to see Pt . Ensure ordered for PT because of poor Pt appetite

## 2017-11-19 NOTE — ED Triage Notes (Signed)
PT out of room to walk to BR .  Pt ambulated with out assistance.

## 2017-11-19 NOTE — BH Assessment (Signed)
  Telepsych Reassessment- Pt appeared to be responding to internal stimuli. Pt appeared to be very frustrated and upset during reassessment. Pt appears to be actively psychotic. When writer asked pt what brought her into the hospital pt stated " I don't have anything to talk about". Writer asked pt how she slept last night pt stated " I slept ok" Writer asked pt did she eat anything today pt stated " I never could eat this crap, its the same rift raft. Writer asked pt was she suicidal or homicidal and pt stated " It's the same rift raft" " I don't care to talk to you anymore" "just forget it, just forget it" .   TTS will continue to seek placement for the pt.   Cornell Barman [facility]  Therapeutic Triage Specialist  [phone]

## 2017-11-19 NOTE — ED Notes (Signed)
TTS attempted with pt. Pt stated "I have nothing to say" and then began yelling at the behavioral health staff.

## 2017-11-19 NOTE — Progress Notes (Addendum)
Disposition CSW called pt's sister and legal Guardian, Daquisha Clermont [phone]) to tell her that patient has been declined at [facility] due to refusing to eat. Sister voiced understanding and frustration.  CSW asked if she had information on when patient had last eaten.  She will need to contact their mother who won't be available until later this afternoon. She related that one way they were able to get pt to eat was to contract with her.  Basically the contract was, if she eats, does not infringe on others rights and takes her medication, they would not send her to the hospital.  She recently began to refuse medication, stopped eating and began to be aggressive with her mother, with whom she lives which is why she was brought to the hospital under IVC.  CSW called and spoke to [facility] ED Nurse, Magda Paganini and asked that the EDP consider admitting the patient if she continues to refuse to eat.  It was reported that patient did not eat breakfast.  If she refuses lunch ED Nurse agreed to talk to EDP.  Disposition CSW's will continue to follow for placement but client has been declined by two hospitals thus far due to not eating and refusing medication.   Timmothy Euler. Kaylyn Lim, MSW, LCSWA Disposition Clinical Social Work [phone] (cell) [phone] (office)

## 2017-11-20 ENCOUNTER — Other Ambulatory Visit: Payer: Self-pay

## 2017-11-20 MED ORDER — LORAZEPAM 2 MG/ML IJ SOLN
1.0000 mg | Freq: Four times a day (QID) | INTRAMUSCULAR | Status: DC | PRN
  Administered 2017-11-21 (×3): 1 mg via INTRAMUSCULAR
  Filled 2017-11-20 (×3): qty 1

## 2017-11-20 MED ORDER — HALOPERIDOL LACTATE 5 MG/ML IJ SOLN
5.0000 mg | Freq: Four times a day (QID) | INTRAMUSCULAR | Status: DC | PRN
  Administered 2017-11-21 (×3): 5 mg via INTRAMUSCULAR
  Filled 2017-11-20 (×3): qty 1

## 2017-11-20 MED ORDER — BENZTROPINE MESYLATE 1 MG PO TABS: 0.5000 mg | ORAL_TABLET | Freq: Two times a day (BID) | ORAL | Status: DC | PRN

## 2017-11-20 MED ORDER — HALOPERIDOL 5 MG PO TABS
5.0000 mg | ORAL_TABLET | Freq: Four times a day (QID) | ORAL | Status: DC | PRN
Start: 2017-11-20 — End: 2017-11-22

## 2017-11-20 MED ORDER — LORAZEPAM 1 MG PO TABS: 1.0000 mg | ORAL_TABLET | Freq: Four times a day (QID) | ORAL | Status: DC | PRN

## 2017-11-20 MED ORDER — BENZTROPINE MESYLATE 1 MG/ML IJ SOLN
0.5000 mg | Freq: Two times a day (BID) | INTRAMUSCULAR | Status: DC
  Administered 2017-11-21: 0.5 mg via INTRAMUSCULAR
  Filled 2017-11-20: qty 2

## 2017-11-20 NOTE — ED Notes (Signed)
Pt sitting on bed. Continues to refuse to eat lunch even after much encouragement. Pt appears to be responding to internal stimuli - pt noted to be talking to herself.

## 2017-11-20 NOTE — ED Notes (Signed)
Dr Denton Lank and Dr Effie Shy completed Second Physician Opinion Progress Note for Medication Administration to Non-Consenting Pt's d/t pt refusing to take meds, eat/drink.

## 2017-11-20 NOTE — ED Notes (Signed)
Pt refusing to eat dinner - pt advised Sitter she promised she would take bite of dessert in a few minutes.

## 2017-11-20 NOTE — ED Notes (Signed)
Patient denies pain and is resting comfortably.  

## 2017-11-20 NOTE — ED Notes (Addendum)
Pt noted to be sleeping. Ensure placed on bedside table - will encourage pt to drink when awakens.

## 2017-11-20 NOTE — ED Notes (Signed)
Pt refusing to eat, refusing meds, and refusing EKG to be performed. [facility] Counselor advised will request for Feliz Beam, NP, [facility], to call.

## 2017-11-20 NOTE — Progress Notes (Signed)
I was contacted and asked to recommend medications for patient's agitation and psychosis.  Reviewed patient's current medication list and patient's allergies, would recommend that patient have Haldol 5 mg p.o. or IM every 6 hours as needed for agitation and psychosis and can use Ativan 1 mg p.o. or IM every 6 hours for agitation or severe anxiety.  Due to patient already being on risperidone would recommend Cogentin 0.5 mg twice daily to be given to prevent EPS.

## 2017-11-20 NOTE — BH Assessment (Signed)
BHH Assessment Progress Note     TTS reassessment:  Attempted to reassess patient who would not make eye with this writer and was extremely angry and cussing.  She states: "I am here over some fucking bullshit."  Explained to patient that she would need to tell us what is going on with her in order for [place] to help her. Patient states, "Mind your own business and leave me alone, get this over with because I am not answering any questions, just do what you have to do."  Since patient has extreme mood volatility and refusing to answer questions, TTS will continue to seek placement for patient.

## 2017-11-20 NOTE — ED Notes (Signed)
Encouraged pt to eat lunch or drink Ensure on bedside table - refusing. Pt noted to be irritable.

## 2017-11-20 NOTE — ED Notes (Signed)
Pt has refused to eat breakfast, lunch, and Ensure x 2.

## 2017-11-21 LAB — CBG MONITORING, ED: Glucose-Capillary: 91 mg/dL (ref 65–99)

## 2017-11-21 NOTE — ED Notes (Signed)
Pt up to restroom with steady gait. Pt refuses to eat or drink anything. Ask pt about ordering something she likes for breakfast, pt states "I don't want anything, now can you just leave me alone?" "That won't help". "leave me alone".

## 2017-11-21 NOTE — ED Notes (Signed)
Breakfast tray ordered 

## 2017-11-21 NOTE — ED Notes (Signed)
Pt in shower - Sitter assisting pt.

## 2017-11-21 NOTE — ED Notes (Signed)
Pt continues to refuse to eat or drink. Pt refusing po meds. Pt noted to be cursing - stating "I'm going to sue this damn place so they will close it down. This is bullsh--". Pt tolerated injections well - refused for them to be placed in thighs/hips - stated "my arms only".
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
## 2017-11-21 NOTE — ED Notes (Signed)
EKG completed - pt brushing her teeth.

## 2017-11-21 NOTE — ED Notes (Signed)
Pt continues to refuse to eat even after much encouragement.

## 2017-11-21 NOTE — ED Notes (Signed)
Pt showered after much encouragement.

## 2017-11-21 NOTE — ED Notes (Signed)
Pt allowed VS's to be taken - continues to refuse EKG to be performed.

## 2017-11-21 NOTE — ED Notes (Signed)
Sitter woke pt and encouraged pt to eat - pt continues to refuse.

## 2017-11-21 NOTE — Progress Notes (Signed)
Patient meets criteria for inpatient treatment. There are currently no appropriate beds available at [facility] per [facility]. CSW faxed referrals to the following facilities for review.  [place], [address], [place], [address], [place], [address], [place], [place], [facility], [facility], [employer], [place], [employer], [place], [place], [place], [employer], [place], [facility], [place], [place], [place], [place], [place], [address], [place], [place], [place], [place], [place] and [place] [facility].   TTS will continue to seek bed placement.     Moss Mc, MSW, LCSW-A, LCAS-A 11/21/2017 8:11 AM

## 2017-11-21 NOTE — ED Notes (Signed)
Sitter and RN encouraging pt to eat lunch - pt continues to refuse to eat.

## 2017-11-21 NOTE — ED Notes (Signed)
Sitter encouraged pt to sit up on bed and eat breakfast - pt refused. Encouraged pt to shower d/t body odor and brush her teeth - pt refused.

## 2017-11-21 NOTE — ED Notes (Signed)
Encouraged pt to take po meds - states "I'm not taking nothing". Advised pt injections have been ordered if she refuses po. States "I don't care. You can do that."

## 2017-11-22 LAB — BASIC METABOLIC PANEL
Anion gap: 20 — ABNORMAL HIGH (ref 5–15)
BUN: 17 mg/dL (ref 6–20)
CO2: 16 mmol/L — ABNORMAL LOW (ref 22–32)
CREATININE: 1.11 mg/dL — AB (ref 0.44–1.00)
Calcium: 9.7 mg/dL (ref 8.9–10.3)
Chloride: 107 mmol/L (ref 101–111)
GFR calc Af Amer: >60 mL/min (ref >60)
GFR calc non Af Amer: 54 mL/min — ABNORMAL LOW (ref >60)
Glucose, Bld: 84 mg/dL (ref 65–99)
POTASSIUM: 4.2 mmol/L (ref 3.5–5.1)
SODIUM: 143 mmol/L (ref 135–145)

## 2017-11-22 MED ORDER — OLANZAPINE 10 MG IM SOLR
10.0000 mg | Freq: Two times a day (BID) | INTRAMUSCULAR | Status: DC | PRN
  Administered 2017-11-22 – 2017-11-23 (×2): 10 mg via INTRAMUSCULAR
  Filled 2017-11-22 (×3): qty 10

## 2017-11-22 MED ORDER — OLANZAPINE 10 MG IM SOLR
5.0000 mg | Freq: Two times a day (BID) | INTRAMUSCULAR | Status: DC
  Administered 2017-11-23 – 2017-11-25 (×5): 5 mg via INTRAMUSCULAR
  Filled 2017-11-22 (×4): qty 10

## 2017-11-22 MED ORDER — STERILE WATER FOR INJECTION IJ SOLN
INTRAMUSCULAR | Status: AC
  Administered 2017-11-22: 10 mL
  Filled 2017-11-22: qty 10

## 2017-11-22 MED ORDER — OLANZAPINE 5 MG PO TABS: 5.0000 mg | ORAL_TABLET | Freq: Two times a day (BID) | ORAL | Status: DC

## 2017-11-22 NOTE — Progress Notes (Signed)
Patient's medications reviewed with Dr Lucianne Muss, psychiatrist.  Discontinued the Haldol and Risperdal, start Zyprexa 5 mg BID oral or IM based on her refusing medications or not.  Zyprexa 10 mg BID PRN agitation order placed, all Ativan orders discontinued due to adverse reaction with Zyprexa which causes a sudden drop in blood pressure.  Changed Cogentin 0.5 mg BID for EPS to PRN since Haldol has been discontinued.  Continue other medications as ordered, refer to inpatient psychiatric admission for stabilization.  Nanine Means, PMHNP

## 2017-11-22 NOTE — ED Notes (Signed)
Pt sleeping. 

## 2017-11-22 NOTE — ED Triage Notes (Signed)
PT asked if she was going eat breakfast ,Pt responded I can take care of my own affairs. PT did not eat food this AM . @ bottles of Ensure at bed side . Pt refused both when offred this AM. Pt alert to self only.

## 2017-11-22 NOTE — ED Notes (Signed)
Pt has 2 full meal trays in room. Pt has no desire to eat and expresses shes not hungry. Told pt if she changed her mind to let me know and I would heat them up for her. Pt expressed understanding

## 2017-11-22 NOTE — BH Assessment (Addendum)
BHH Assessment Progress Note  Pt reassessed today. Pt presents as extremely angry. Pt has not been eating and clinician asked her about that. Pt said "food is crap". Pt then said some other unintelligible things, but refused to repeat them, upon clinician request. Clinician asked pt about drinking the Ensure and pt said something like "everybody know what that is that is --- process". Pt continues to be recommended for IP treatment.   Johny Shock. Ladona Ridgel, MS, NCC, LPCA Counselor

## 2017-11-22 NOTE — ED Notes (Signed)
Pt up to use bathroom 

## 2017-11-22 NOTE — ED Triage Notes (Signed)
Mother called and asked why Pt Pt wasn't sent to the [place]. Pt referred to [facility] for that information.

## 2017-11-22 NOTE — ED Provider Notes (Signed)
Patient evaluated today for lack of improvement as well as lack of agitation, therefore she did not take any Haldol.  Nursing has noted that she is not eating so a [place] was ordered.  Psychiatry consultation requested and obtained.  Medication changes initiated still as forced medication.  Stop Haldol for now.  Zyprexa regular dose ordered with PRN for agitation.  It is suspected that the Zyprexa will improve both her appetite and her psychosis, which will improve her chances for successful treatment.   Mancel Bale, MD 11/22/17 1255

## 2017-11-22 NOTE — ED Triage Notes (Signed)
PT lunch at bed side , Pt refusing to eat.

## 2017-11-23 ENCOUNTER — Encounter (HOSPITAL_COMMUNITY): Payer: Self-pay | Admitting: Registered Nurse

## 2017-11-23 DIAGNOSIS — Z9114 Patient's other noncompliance with medication regimen: Secondary | ICD-10-CM | POA: Diagnosis not present

## 2017-11-23 DIAGNOSIS — F17210 Nicotine dependence, cigarettes, uncomplicated: Secondary | ICD-10-CM | POA: Diagnosis not present

## 2017-11-23 DIAGNOSIS — F200 Paranoid schizophrenia: Secondary | ICD-10-CM | POA: Diagnosis not present

## 2017-11-23 DIAGNOSIS — E860 Dehydration: Secondary | ICD-10-CM | POA: Diagnosis not present

## 2017-11-23 DIAGNOSIS — I951 Orthostatic hypotension: Secondary | ICD-10-CM | POA: Diagnosis not present

## 2017-11-23 DIAGNOSIS — G4700 Insomnia, unspecified: Secondary | ICD-10-CM | POA: Diagnosis not present

## 2017-11-23 DIAGNOSIS — F209 Schizophrenia, unspecified: Secondary | ICD-10-CM | POA: Diagnosis not present

## 2017-11-23 LAB — CBG MONITORING, ED: GLUCOSE-CAPILLARY: 81 mg/dL (ref 65–99)

## 2017-11-23 MED ORDER — STERILE WATER FOR INJECTION IJ SOLN
INTRAMUSCULAR | Status: AC
  Administered 2017-11-23: 1 mL
  Filled 2017-11-23: qty 10

## 2017-11-23 NOTE — ED Notes (Addendum)
Pt initially refusing for injection to be placed in hip. Pt then compliant after encouragement. Pt tolerated well. States "Jerrye Noble doing too much. It's not doing any good". Asked pt if she would brush her teeth - stated "What good is it going to do?" Offered pt to shower - refused. RN and Comptroller Encouraged pt to eat lunch - refuses.

## 2017-11-23 NOTE — ED Notes (Signed)
Pt cooperatively allowed nurse to check CBG, states she does se the point of eating.

## 2017-11-23 NOTE — ED Notes (Signed)
Sitter, NT, and RN encouraged pt to eat dinner - individually - pt continues to refuse.

## 2017-11-23 NOTE — Consult Note (Addendum)
Tele Assessment   [name], 57 y.o., female patient presented to [facility] under IVC on 11/17/17 wit complaints of cursing out invisible people, not eating or taking her medications.  Patient seen via telepsych by this provider; chart reviewed and consulted with Dr. Lucianne Muss on 11/23/17.  According to reviewed notes patient still refusing to eat.  Second medication recommendation was made yesterday changing Haldol to Zyprexa and discontinuing Ativan.  On evaluation [name] reports that she has been sleeping okay but she has not been eating because "I DON'T CARE FOR IT.  I JUST WANT TO GO HOME. I CAN PROCESS MY SELF AT HOME.  YALL ALWAYS GOT A DAMN SOLUTION TO A PROBLEM. I ONLY WANT GOVERNMENT SOLUTIONS." Patient was then asked question to assess orientations patient stated.  "YOU DON'T NEED ALL THAT.  I DON'T NEED TO ANSWER PERSONAL QUESTIONS (Referring to her date of birth,age, and address); YES I KNOW MY DATE OF BIRTH; NONE OF YOUR DAMN BUSINESS. I'M AT [place]. IT'S 2026 AND IT WILL BE 2027 NEXT YEAR.  I DON'T CARE WHAT THAT [place] CALENDER SAY.  I'M DONE TALKING TO YOU.  YOU AIN'T GONE HELP ME DO NOTHING; YOU JUST BEING STUPID ABOUT IT; I'M DONE; YOU DON'T HAVE A SOLUTION; AND I ONLY WANT SOLUTIONS FROM THE GOVERNMENT.  During evaluation [name] is alert and oriented to self and place.  Patient cooperative through most of interview but she was irritated and yelling during whole assessment.  Patient continues to be paranoia and not eating or drinking anything given and stating that she will only eat what she her self or the government process.  Unable to determine if patient is responding to internal or external stimuli; but patient does appear to have paranoid ideation.  patient denies suicidal/homicidal ideation, psychosis, and paranoia.  Will continue to see placement for inpatient psychiatric treatment    Recommendations:  Continue to see inpatient psychiatric treatment and continue current  psychotropic mediations.  Disposition: Recommend psychiatric Inpatient admission when medically cleared.    Shuvon Rankin, NP   Agree with above plan

## 2017-11-23 NOTE — ED Notes (Signed)
Telepsych being performed. 

## 2017-11-23 NOTE — ED Notes (Signed)
Sitting on side of bed talking to herself.

## 2017-11-23 NOTE — Progress Notes (Signed)
CSW has updated pt's application for inpatient care at the [place] in [place] [employer]. The application documents will need to be signed by a physician before sending. 1st shift disposition will follow up with this if possible.   Wells Guiles, LCSW, LCAS Disposition CSW [facility] BHH/TTS [phone]

## 2017-11-23 NOTE — ED Notes (Addendum)
Woke pt - encouraged pt to eat/drink - offered ice cream - pt yelled at nurse and is continuing to refuse to eat. [facility]vised [facility] if [facility] will [facility]drink or she will remain in hospital. [name] "I don't care! I'm not going to eat this food or drink anything!" Encouraged pt to shower x 3 - refuses. Pt drank juice at 1700.

## 2017-11-23 NOTE — ED Notes (Signed)
Pt refusing po meds/foods/fluids - even after much encouragement. Pt tolerated injection well. Stated "Don't use my left arm - use my right". Encouraged pt to allow RN to administer in hip/thigh - refuses.

## 2017-11-23 NOTE — ED Notes (Signed)
Sitter encouraging pt to eat/drink - continues to refuse.

## 2017-11-23 NOTE — ED Notes (Signed)
Patient refused to order for lunch.

## 2017-11-24 MED ORDER — STERILE WATER FOR INJECTION IJ SOLN
INTRAMUSCULAR | Status: AC
  Administered 2017-11-24: 2.1 mL
  Filled 2017-11-24: qty 10

## 2017-11-24 NOTE — ED Notes (Signed)
Sitter and RN has offered fluids throughout the night but patient continues to refuse offers-Monique,RN

## 2017-11-24 NOTE — ED Notes (Signed)
Regular Diet Ordered for Dinner. 

## 2017-11-24 NOTE — ED Notes (Signed)
Patient sitting on side of bed, drinking fluids from lunch tray.

## 2017-11-24 NOTE — ED Notes (Signed)
Patient refused a snack and drink at this time.

## 2017-11-24 NOTE — ED Notes (Signed)
Patient has 1 bag in [place].

## 2017-11-24 NOTE — ED Notes (Signed)
Notified Dr. Jeraldine Loots that patient's IVC expires at 1500. EDP verbalized understanding.

## 2017-11-24 NOTE — ED Notes (Signed)
TTS in progress at this time.  

## 2017-11-24 NOTE — ED Notes (Signed)
Supper at bedside at this time.  

## 2017-11-24 NOTE — ED Notes (Signed)
Regular Diet was ordered for Lunch. 

## 2017-11-24 NOTE — BHH Counselor (Signed)
Reassessment- Pt's speech is still tangential. Pt had flight of ideas. Pt was not oriented.  TTS will continue to seek placement.  Wolfgang Phoenix, [facility] Triage Specialist

## 2017-11-24 NOTE — ED Provider Notes (Signed)
Patient sleeping. Patient has been here for 7 days, involuntary commitment papers are expiring, but with her persistent psychosis, they have been resubmitted.   Gerhard Munch, MD 11/24/17 [phone]

## 2017-11-24 NOTE — Progress Notes (Signed)
Pt has been considered for inpatient placement at the [facility], where she receives most of her treatment.  However, they are declining to take her at this time due to her refusal to eat or drink.  Patient has been in the ED since 11/17/17 and the only record of her taking anything in po was 240 ml of juice on 11/23/17 .  Labs remain fairly constant and she doesn't appear to be medically compromised at this point.  This Clinical research associate made suggestion, to her [facility] ED RN, that she be again be told that if she contracts to eat and drink, she may go to the [place] for treatment and then go home.  This information was presented to her yesterday without success but it may be worth presenting to her again if she begins to clear psychiatrically.  Timmothy Euler. Kaylyn Lim, MSW, LCSWA Disposition Clinical Social Work [phone] (cell) [phone] (office)

## 2017-11-25 NOTE — ED Notes (Signed)
TTS done 

## 2017-11-25 NOTE — BH Assessment (Signed)
Patient is sitting on side of bed dressed in scrubs. Her affect is angry and her speech is angry. She refuses to answer writer's questions. He says, "I'm tired of all these questions." Writer encourages pt to continue drinking and eating so  She can get to the [place] and then to her home. Pt then says, "I'm not in an obligation to the [place].": Patient continue to needs inpatient psychiatric treatment.  Evette Cristal, [place] Therapeutic Triage Specialist

## 2017-11-25 NOTE — Progress Notes (Signed)
Spoke with pt's ED Nurse, Arline Asp, RN.  Nurse reports that patient did drink her coffee this morning but continues to refuse to eat.  Nurse will continue to encourage pt. To eat and drink.  Timmothy Euler. Kaylyn Lim, MSW, LCSWA Disposition Clinical Social Work [phone] (cell) [phone] (office)

## 2017-11-25 NOTE — ED Notes (Signed)
BH assessment called and wanted me to tell pt that she needed to eat and drink so she could go to the [place], which I did

## 2017-11-25 NOTE — ED Notes (Signed)
Pt states thaT SHE IS AWARE AND THAT SHE DID NOT REQUEST THE [place] AND SHE WILL TAKE IT UP WITH THE GOVERNMENT,  THIS IS REPONSE WHEN I tOLD HER , thank you for drinking coffee and  And that behavorial heal;th called and said that she could go to [facility] if she cont to eat and or drink

## 2017-11-25 NOTE — ED Notes (Signed)
Pt only drank tea for lunch

## 2017-11-25 NOTE — Progress Notes (Signed)
Pt's mother called to inquire about pt. Pt's mother stated that pt was "eating well" before coming to the hospital but, "hadn't been taking her medicine for some time". She is hopeful that pt will begin eating again soon and voiced a desire for pt to be transferred to the [facility] when possible.  CSW will continue to assist with placement needs.   Wells Guiles, LCSW, LCAS Disposition CSW [facility] BHH/TTS [phone]

## 2017-11-25 NOTE — ED Notes (Signed)
Pt refusing to eat any of her dinner tray but drank her tea. Pt informed of her need to eat and drink before she can be admitted to a different care center and continued to refuse.

## 2017-11-25 NOTE — ED Provider Notes (Signed)
Patient is sleeping on today's evaluation, in no distress, hemodynamically stable, awaiting placement.   Paula Munch, MD 11/25/17 1409

## 2017-11-26 ENCOUNTER — Other Ambulatory Visit: Payer: Self-pay

## 2017-11-26 ENCOUNTER — Encounter (HOSPITAL_COMMUNITY): Payer: Self-pay

## 2017-11-26 ENCOUNTER — Inpatient Hospital Stay (HOSPITAL_COMMUNITY)
Admission: AD | Admit: 2017-11-26 | Discharge: 2017-12-10 | DRG: 885 | Disposition: A | Discharge status: Home / Self Care | Payer: Medicare Other | Source: Intra-hospital | Attending: Emergency Medicine | Admitting: Emergency Medicine

## 2017-11-26 DIAGNOSIS — E860 Dehydration: Secondary | ICD-10-CM | POA: Diagnosis present

## 2017-11-26 DIAGNOSIS — G4700 Insomnia, unspecified: Secondary | ICD-10-CM | POA: Diagnosis not present

## 2017-11-26 DIAGNOSIS — F339 Major depressive disorder, recurrent, unspecified: Secondary | ICD-10-CM | POA: Diagnosis not present

## 2017-11-26 DIAGNOSIS — F17210 Nicotine dependence, cigarettes, uncomplicated: Secondary | ICD-10-CM | POA: Diagnosis present

## 2017-11-26 DIAGNOSIS — Z5320 Procedure and treatment not carried out because of patient's decision for unspecified reasons: Secondary | ICD-10-CM | POA: Diagnosis not present

## 2017-11-26 DIAGNOSIS — R450 Nervousness: Secondary | ICD-10-CM | POA: Diagnosis not present

## 2017-11-26 DIAGNOSIS — F200 Paranoid schizophrenia: Principal | ICD-10-CM | POA: Diagnosis present

## 2017-11-26 DIAGNOSIS — Z9111 Patient's noncompliance with dietary regimen: Secondary | ICD-10-CM

## 2017-11-26 DIAGNOSIS — F5089 Other specified eating disorder: Secondary | ICD-10-CM | POA: Diagnosis not present

## 2017-11-26 DIAGNOSIS — Z9119 Patient's noncompliance with other medical treatment and regimen: Secondary | ICD-10-CM | POA: Diagnosis not present

## 2017-11-26 DIAGNOSIS — F1099 Alcohol use, unspecified with unspecified alcohol-induced disorder: Secondary | ICD-10-CM | POA: Diagnosis not present

## 2017-11-26 DIAGNOSIS — R451 Restlessness and agitation: Secondary | ICD-10-CM | POA: Diagnosis not present

## 2017-11-26 DIAGNOSIS — F29 Unspecified psychosis not due to a substance or known physiological condition: Secondary | ICD-10-CM | POA: Diagnosis not present

## 2017-11-26 DIAGNOSIS — F329 Major depressive disorder, single episode, unspecified: Secondary | ICD-10-CM | POA: Diagnosis not present

## 2017-11-26 DIAGNOSIS — F17200 Nicotine dependence, unspecified, uncomplicated: Secondary | ICD-10-CM | POA: Diagnosis not present

## 2017-11-26 DIAGNOSIS — F209 Schizophrenia, unspecified: Secondary | ICD-10-CM | POA: Diagnosis not present

## 2017-11-26 DIAGNOSIS — Z9114 Patient's other noncompliance with medication regimen: Secondary | ICD-10-CM

## 2017-11-26 DIAGNOSIS — R748 Abnormal levels of other serum enzymes: Secondary | ICD-10-CM

## 2017-11-26 DIAGNOSIS — I951 Orthostatic hypotension: Secondary | ICD-10-CM | POA: Diagnosis not present

## 2017-11-26 DIAGNOSIS — Z79899 Other long term (current) drug therapy: Secondary | ICD-10-CM | POA: Diagnosis not present

## 2017-11-26 DIAGNOSIS — R454 Irritability and anger: Secondary | ICD-10-CM | POA: Diagnosis not present

## 2017-11-26 DIAGNOSIS — F419 Anxiety disorder, unspecified: Secondary | ICD-10-CM | POA: Diagnosis not present

## 2017-11-26 MED ORDER — HYDROXYZINE HCL 25 MG PO TABS: 25.0000 mg | ORAL_TABLET | Freq: Three times a day (TID) | ORAL | Status: DC | PRN

## 2017-11-26 MED ORDER — ALUM & MAG HYDROXIDE-SIMETH 200-200-20 MG/5ML PO SUSP: 30.0000 mL | ORAL | Status: DC | PRN

## 2017-11-26 MED ORDER — LORAZEPAM 1 MG PO TABS: 2.0000 mg | ORAL_TABLET | Freq: Four times a day (QID) | ORAL | Status: DC | PRN

## 2017-11-26 MED ORDER — MAGNESIUM HYDROXIDE 400 MG/5ML PO SUSP: 30.0000 mL | Freq: Every day | ORAL | Status: DC | PRN

## 2017-11-26 MED ORDER — DIPHENHYDRAMINE HCL 25 MG PO CAPS: 50.0000 mg | ORAL_CAPSULE | Freq: Four times a day (QID) | ORAL | Status: DC | PRN

## 2017-11-26 MED ORDER — LORAZEPAM 2 MG/ML IJ SOLN
2.0000 mg | Freq: Four times a day (QID) | INTRAMUSCULAR | Status: DC | PRN
  Administered 2017-11-28: 2 mg via INTRAMUSCULAR
  Filled 2017-11-26: qty 1

## 2017-11-26 MED ORDER — TRAZODONE HCL 50 MG PO TABS: 50.0000 mg | ORAL_TABLET | Freq: Every evening | ORAL | Status: DC | PRN

## 2017-11-26 MED ORDER — HYDROXYZINE HCL 50 MG PO TABS: 50.0000 mg | ORAL_TABLET | Freq: Four times a day (QID) | ORAL | Status: DC | PRN

## 2017-11-26 MED ORDER — HALOPERIDOL 5 MG PO TABS
5.0000 mg | ORAL_TABLET | Freq: Three times a day (TID) | ORAL | Status: DC | PRN
  Filled 2017-11-26: qty 1

## 2017-11-26 MED ORDER — ACETAMINOPHEN 325 MG PO TABS: 650.0000 mg | ORAL_TABLET | Freq: Four times a day (QID) | ORAL | Status: DC | PRN

## 2017-11-26 MED ORDER — HALOPERIDOL LACTATE 5 MG/ML IJ SOLN
5.0000 mg | Freq: Three times a day (TID) | INTRAMUSCULAR | Status: DC | PRN
  Administered 2017-11-28: 5 mg via INTRAMUSCULAR
  Filled 2017-11-26: qty 1

## 2017-11-26 MED ORDER — DIPHENHYDRAMINE HCL 50 MG/ML IJ SOLN
50.0000 mg | Freq: Four times a day (QID) | INTRAMUSCULAR | Status: DC | PRN
  Administered 2017-11-28 – 2017-11-29 (×2): 50 mg via INTRAMUSCULAR
  Filled 2017-11-26 (×2): qty 1
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
## 2017-11-26 NOTE — Progress Notes (Signed)
Patient ID: [name], female   DOB: 1961/06/18, 57 y.o.   MRN: [id number] PER STATE REGULATIONS 482.30  THIS CHART WAS REVIEWED FOR MEDICAL NECESSITY WITH RESPECT TO THE PATIENT'S ADMISSION/DURATION OF STAY.  NEXT REVIEW DATE:11/30/17  Loura Halt, RN, BSN CASE MANAGER

## 2017-11-26 NOTE — Progress Notes (Signed)
Adult Psychoeducational Group Note  Date:  11/26/2017 Time:  9:14 PM  Group Topic/Focus:  Wrap-Up Group:   The focus of this group is to help patients review their daily goal of treatment and discuss progress on daily workbooks.  Participation Level:  Active  Participation Quality:  Appropriate  Affect:  Appropriate  Cognitive:  Appropriate  Insight: Appropriate  Engagement in Group:  Engaged  Modes of Intervention:  Discussion  Additional Comments:  The patient expressed that she rates today a 6.The patient also said that she did not attend group.  Octavio Manns 11/26/2017, 9:14 PM

## 2017-11-26 NOTE — ED Notes (Signed)
Patient continues to refuse food. When asked if this RN can get food that she prefers to eat, patient states "just drop it, I gave it enough time, Im over it" When asked if there is anything else she might like for this RN to bring back to her, patient states "toothbrush, tooth paste and a shower"

## 2017-11-26 NOTE — BHH Counselor (Signed)
Patient accepted to [facility] by Dr. Lucianne Muss and the attending provider is Dr. Altamese [place]. Room number is 505-1. Nursing report [phone]. Patient is under IVC and nursing staff will need to coordinate [employer] transport. Patient's nurse Al Decant updated about patient's disposition.

## 2017-11-26 NOTE — ED Notes (Signed)
Police arrived to transport patient, patient is cooperative. belongings bag and envelop given to police

## 2017-11-26 NOTE — Progress Notes (Signed)
Pt in bed but awake.  Pt is flat, depressed and quite.  Pt makes minimal eye contact and sts she only wishes to get some sleep.  Pt denies HI, SI and AVH.  Pt verbally contracts for safety.  Pt does not get out of bed for group. No current meds ordered for pt on this shift. Pt refuses snack and drink. Pt remains safe on unit.

## 2017-11-26 NOTE — ED Notes (Signed)
Report given to BHH  

## 2017-11-26 NOTE — ED Notes (Signed)
Non-Emergency police called to transport patient to [facility]

## 2017-11-26 NOTE — Progress Notes (Signed)
Patient was angry and irritable during assessment.  Patient refused to answer any interviewing questions by the nurse stating "I am here because they want me to be here."  Patient refused to sign any admission documentation and did not give any consent for visitors or phone calls.  Patient did allow for nurse to complete skin assessment.  Patient was found to be free of all injury and contraband.    Patient was noted to be responding to internal stimuli, and talking to unseen other.  Patient was admitted to the unit without any incident.

## 2017-11-27 DIAGNOSIS — F209 Schizophrenia, unspecified: Secondary | ICD-10-CM

## 2017-11-27 DIAGNOSIS — F419 Anxiety disorder, unspecified: Secondary | ICD-10-CM

## 2017-11-27 DIAGNOSIS — R454 Irritability and anger: Secondary | ICD-10-CM

## 2017-11-27 DIAGNOSIS — R451 Restlessness and agitation: Secondary | ICD-10-CM

## 2017-11-27 MED ORDER — PALIPERIDONE ER 6 MG PO TB24
6.0000 mg | ORAL_TABLET | Freq: Every day | ORAL | Status: DC
  Filled 2017-11-27: qty 1

## 2017-11-27 MED ORDER — CARBAMAZEPINE 100 MG PO CHEW
100.0000 mg | CHEWABLE_TABLET | Freq: Three times a day (TID) | ORAL | Status: DC
  Filled 2017-11-27 (×20): qty 1

## 2017-11-27 MED ORDER — OLANZAPINE 5 MG PO TBDP
5.0000 mg | ORAL_TABLET | Freq: Two times a day (BID) | ORAL | Status: DC
  Filled 2017-11-27 (×6): qty 1

## 2017-11-27 NOTE — Progress Notes (Signed)
Pt refuses to have labs drawn.

## 2017-11-27 NOTE — BHH Group Notes (Signed)
BHH Group Notes: (Clinical Social Work)   11/27/2017      Type of Therapy:  Group Therapy   Participation Level:  Did Not Attend - sleeping   Kurstin Dimarzo Grossman-Orr, LCSW 11/27/2017, 12:03 PM 

## 2017-11-27 NOTE — Progress Notes (Signed)
Patient denies SI, HI and AVH though patient was noted to be responding to unseen other.  Patient refused all treatment and states "I don't need to be here, Its all a big misunderstanding."  Assess patient for safety, offer medications as prescribed, engage patient in 1:1 staff talks.   Patient able to contract for safety.

## 2017-11-27 NOTE — BHH Suicide Risk Assessment (Signed)
[facility] Admission Suicide Risk Assessment   Nursing information obtained from:   patient and chart  Demographic factors:   57 year old female  Current Mental Status:   see below Loss Factors:    medication non compliance  Historical Factors:   Schizophrenia, history of prior psychiatric admissions Risk Reduction Factors:   resilience   Total Time spent with patient: 45 minutes Principal Problem: Schizophrenia (HCC) Diagnosis:   Patient Active Problem List   Diagnosis Date Noted  . Schizophrenia (HCC) [F20.9] 11/26/2017  . Noncompliance with diet and medication regimen [Z91.11, Z91.14]   . Psychoses (HCC) [F29]   . Hypokalemic alkalosis [E87.3] 11/11/2011  . Medically noncompliant [Z91.19] 11/11/2011  . Schizophrenia, paranoid (HCC) [F20.0] 11/07/2011    Continued Clinical Symptoms:    The "Alcohol Use Disorders Identification Test", Guidelines for Use in Primary Care, Second Edition.  World Science writer [facility]). Score between 0-7:  no or low risk or alcohol related problems. Score between 8-15:  moderate risk of alcohol related problems. Score between 16-19:  high risk of alcohol related problems. Score 20 or above:  warrants further diagnostic evaluation for alcohol dependence and treatment.   CLINICAL FACTORS:  57 year old female, history of Schizophrenia.  Presented to the ED on IVC reporting that patient was internally preoccupied , cursing at invisible people, not eating , not taking her medications. In ED was hostile , guarded, disorganized , with poor PO intake. Of note, vitals are stable- BP 99/79, pulse 68 At this time patient is poor historian, unable to provide significant information. She presents angry, scowling at writer, becomes easily agitated and loud, stating she does not need to be in the hospital and that people are " lying " . Patient has history of prior admission to [facility] in 2016 for disorganized, agitated behavior. At the time was diagnosed with  Schizophrenia. At the time was managed with Hinda Glatter   Dx- Schizophrenia  Plan-  Zyprexa 5 mgrs BID / Tegretol 100 mgrs TID. Agitation Protocol: Haldol/Ativan.  Monitor EKG for QTc interval  Recheck BMP and CBC in AM Encourage PO fluids and PO intake .   Musculoskeletal: Strength & Muscle Tone: within normal limits Gait & Station: normal Patient leans: [name]  Psychiatric Specialty Exam: Physical Exam  ROS denies headache, no chest pain, no shortness of breath, no vomiting   Blood pressure 105/81, pulse (!) 104, temperature 97.7 F (36.5 C), temperature source Oral, resp. rate 16.There is no height or weight on file to calculate BMI.  General Appearance: Fairly Groomed  Eye Contact:  Fair  Speech:  loud at times   Volume:  variable  Mood:  states mood is normal, presents angry, irritable  Affect:  angry, hostile  Thought Process:  Disorganized and Descriptions of Associations: Tangential  Orientation:  Other:  fully alert and attentive  Thought Content:  denies hallucinations. Presents guarded, angry, suspicious  Suicidal Thoughts:  No denies suicidal or self injurious ideations, denies homicidal or violent ideations  Homicidal Thoughts:  No  Memory:  recent and remote fair   Judgement:  Impaired  Insight:  Lacking  Psychomotor Activity:  easily agitated, pacing at times   Concentration:  Concentration: Fair and Attention Span: Fair  Recall:  [employer] of Knowledge:  Fair  Language:  Fair  Akathisia:  Negative  Handed:  Right  AIMS (if indicated):     Assets:  Resilience  ADL's:  Impaired  Cognition:  WNL  Sleep:  Number of Hours: 6.75  COGNITIVE FEATURES THAT CONTRIBUTE TO RISK:  Closed-mindedness and Loss of executive function    SUICIDE RISK:   Mild:  Suicidal ideation of limited frequency, intensity, duration, and specificity.  There are no identifiable plans, no associated intent, mild dysphoria and related symptoms, good self-control (both objective and  subjective assessment), few other risk factors, and identifiable protective factors, including available and accessible social support.  PLAN OF CARE: Patient will be admitted to inpatient psychiatric unit for stabilization and safety. Will provide and encourage milieu participation. Provide medication management and maked adjustments as needed.  Will follow daily.    I certify that inpatient services furnished can reasonably be expected to improve the patient's condition.   Craige Cotta, MD 11/27/2017, 6:15 PM

## 2017-11-27 NOTE — Plan of Care (Signed)
D: Pt kept to herself, pt forwards little information this evening. Pt refused evening medication  A: Pt was offered support and encouragement. Pt was encourage to attend groups. Q 15 minute checks were done for safety.   R:safety maintained on unit.   Problem: Safety: Goal: Periods of time without injury will increase Outcome: Progressing

## 2017-11-27 NOTE — H&P (Addendum)
Psychiatric Admission Assessment Adult  Patient Identification: [name]  MRN:  [id number]  Date of Evaluation:  11/27/2017  Chief Complaint:  SCHIZOPHRENIA  Principal Diagnosis: Schizophrenia ([place])  Diagnosis:   Patient Active Problem List   Diagnosis Date Noted  . Schizophrenia ([place]) [F20.9] 11/26/2017    Priority: High  . Noncompliance with diet and medication regimen [Z91.11, Z91.14]   . Psychoses ([place]) [F29]   . Hypokalemic alkalosis [E87.3] 11/11/2011  . Medically noncompliant [Z91.19] 11/11/2011  . Schizophrenia, paranoid ([place]) [F20.0] 11/07/2011   History of Present Illness: This is an admission assessment for this 57 year old AA female with hx of chronic Schizophrenia. She is known on this unit from previous admissions for mood stabilization treatments. She is admitted to the [facility] this time from the [facility] with complaints of; patient not sleeping, eating or taking her mental health medications. Chart review indicated that patient has lost some weight, experiencing hallucinations & has been screaming a lot. She has a legal guardian who also is her sister.  During this assessment, [name] reports angrily while agitated, "It is all lies, lies, lies & lies that brought me here. They said I was Schizophrenia. That was what used to get me here, ma'am. They forced me in here. I don't know what happened. I'm trying to be calm here because they really know how to piss me off. They try to say I need medicines, but, I don't. I'm sleeping fine. I'm upset because I don't want to be here. They said I was hearing voices. I'm telling you, it is all a lie.  Associated Signs/Symptoms:  Depression Symptoms:  psychomotor agitation, anxiety,  (Hypo) Manic Symptoms:  Irritable Mood, Labiality of Mood,  Anxiety Symptoms:  Excessive Worry,  Psychotic Symptoms:  Delusions, Paranoia,  PTSD Symptoms: NA  Total Time spent with patient: 1 hour  Past Psychiatric History:  Schizophrenia  Is the patient at risk to self? No.  Has the patient been a risk to self in the past 6 months? No.  Has the patient been a risk to self within the distant past? No.  Is the patient a risk to others? No.  Has the patient been a risk to others in the past 6 months? No.  Has the patient been a risk to others within the distant past? No.   Prior Inpatient Therapy: Yes  Prior Outpatient Therapy: Yes.  Alcohol Screening:   Substance Abuse History in the last 12 months:  No.  Consequences of Substance Abuse: NA  Previous Psychotropic Medications: Yes   Psychological Evaluations: No   Past Medical History:  Past Medical History:  Diagnosis Date  . Non compliance w medication regimen   . Schizophrenia ([place])    History reviewed. No pertinent surgical history.  Family History: History reviewed. No pertinent family history.  Family Psychiatric  History: Unable to obtain this information, patient is agitated.  Tobacco Screening:    Social History:  Social History   Substance and Sexual Activity  Alcohol Use Yes   Comment: Refuses to disclose how much     Social History   Substance and Sexual Activity  Drug Use No   Comment: Refuses to answer    Additional Social History:  Allergies:   Allergies  Allergen Reactions  . Aspirin Other (See Comments)    Pt refused to state reaction. Pt's sister states its an allergy and unsure of reaction.  [place] [Ziprasidone Hcl]     Other reaction(s): Other (See Comments)  Body movements Listed in patient chart from [facility]   Lab Results: No results found for this or any previous visit (from the past 48 hour(s)).  Blood Alcohol level:  Lab Results  Component Value Date   ETH <10 11/17/2017   ETH <5 94/17/4081   Metabolic Disorder Labs:  Lab Results  Component Value Date   HGBA1C 6.1 (H) 04/16/2015   MPG 128 04/16/2015   Lab Results  Component Value Date   PROLACTIN 13.6 04/16/2015   Lab Results   Component Value Date   CHOL 186 04/16/2015   TRIG 64 04/16/2015   HDL 55 04/16/2015   CHOLHDL 3.4 04/16/2015   VLDL 13 04/16/2015   LDLCALC 118 (H) 04/16/2015   Current Medications: Current Facility-Administered Medications  Medication Dose Route Frequency Provider Last Rate Last Dose  . acetaminophen (TYLENOL) tablet 650 mg  650 mg Oral Q6H PRN Money, Lowry Ram, FNP      . alum & mag hydroxide-simeth (MAALOX/MYLANTA) 200-200-20 MG/5ML suspension 30 mL  30 mL Oral Q4H PRN Money, Darnelle Maffucci B, FNP      . diphenhydrAMINE (BENADRYL) capsule 50 mg  50 mg Oral Q6H PRN Pennelope Bracken, MD       Or  . diphenhydrAMINE (BENADRYL) injection 50 mg  50 mg Intramuscular Q6H PRN Pennelope Bracken, MD      . haloperidol (HALDOL) tablet 5 mg  5 mg Oral Q8H PRN Pennelope Bracken, MD       Or  . haloperidol lactate (HALDOL) injection 5 mg  5 mg Intramuscular Q8H PRN Pennelope Bracken, MD      . hydrOXYzine (ATARAX/VISTARIL) tablet 50 mg  50 mg Oral Q6H PRN Pennelope Bracken, MD      . LORazepam (ATIVAN) tablet 2 mg  2 mg Oral Q6H PRN Pennelope Bracken, MD       Or  . LORazepam (ATIVAN) injection 2 mg  2 mg Intramuscular Q6H PRN Pennelope Bracken, MD      . magnesium hydroxide (MILK OF MAGNESIA) suspension 30 mL  30 mL Oral Daily PRN Money, Darnelle Maffucci B, FNP      . traZODone (DESYREL) tablet 50 mg  50 mg Oral QHS PRN Money, Lowry Ram, FNP       PTA Medications: No medications prior to admission.   Musculoskeletal: Strength & Muscle Tone: within normal limits Gait & Station: normal Patient leans: [name]  Psychiatric Specialty Exam: Physical Exam  Constitutional: She appears well-developed.  HENT:  Head: Normocephalic.  Eyes: Pupils are equal, round, and reactive to light.  Neck: Normal range of motion.  Cardiovascular:  Elevated pulse rate  Respiratory: Effort normal.  GI: Soft.  Genitourinary:  Genitourinary Comments: Deferred  Musculoskeletal:  Normal range of motion.  Neurological: She is alert.  Skin: Skin is warm.    Review of Systems  Constitutional: Negative.   HENT: Negative.   Eyes: Negative.   Respiratory: Negative.  Negative for cough, shortness of breath and wheezing.   Cardiovascular: Negative.        Elevated pulse rate (104)  Gastrointestinal: Negative for abdominal pain, nausea and vomiting.  Genitourinary: Negative.   Musculoskeletal: Negative.   Skin: Negative.   Neurological: Negative.   Endo/Heme/Allergies: Negative.     Blood pressure 105/81, pulse (!) 104, temperature 97.7 F (36.5 C), temperature source Oral, resp. rate 16.There is no height or weight on file to calculate BMI.  General Appearance: Casual and Fairly Groomed, agitated  Eye Contact:  Minimal  Speech:  Clear and Coherent and Pressured  Volume:  Increased  Mood:  Angry and Irritable  Affect:  Congruent and Labile  Thought Process:  Disorganized and Descriptions of Associations: Tangential  Orientation:  Other:  oriented to self & place. Disoriented to situation  Thought Content:  Illogical, Paranoid Ideation and Tangential  Suicidal Thoughts:  Denies  Homicidal Thoughts:  Denies  Memory:  Immediate;   Fair Recent;   Poor Remote;   Poor  Judgement:  Impaired  Insight:  Lacking  Psychomotor Activity:  Agitated, anxious, angry  Concentration:  Concentration: Poor and Attention Span: Poor  Recall:  Poor  Fund of Knowledge:  Poor  Language:  Fair  Akathisia:  Negative  Handed:  Right  AIMS (if indicated):     Assets:  [employer]ed forces logistics/support/administrative officer Social Support  ADL's:  Intact  Cognition:  Impaired,  Moderate  Sleep:  Number of Hours: 6.75   Treatment Plan/Recommendations: 1. Admit for crisis management and stabilization, estimated length of stay 3-5 days.   2. Medication management to reduce current symptoms to base line and improve the patient's overall level of functioning: See MAR, Md's SRA & treatment plan.   Observation  Level/Precautions:  15 minute checks  Laboratory:  Per ED  Psychotherapy:  Group sessions  Medications: See MAR.   Consultations: As needed  Discharge Concerns: Safety, mood stability, medication compliance    Estimated LOS: 5-7 days  Other: Admit to the 500-hall.    Physician Treatment Plan for Primary Diagnosis: Schizophrenia ([place]) Long Term Goal(s): Improvement in symptoms so as ready for discharge  Short Term Goals: Ability to verbalize feelings will improve  Physician Treatment Plan for Secondary Diagnosis: Principal Problem:   Schizophrenia ([place])  Long Term Goal(s): Improvement in symptoms so as ready for discharge  Short Term Goals: Compliance with prescribed medications will improve  I certify that inpatient services furnished can reasonably be expected to improve the patient's condition.    Lindell Spar, NP, PMHNP, FNP-BC 5/11/20192:29 PM  I have discussed case with NP and have met with patient  Agree with NP note and assessment  57 year old female, history of Schizophrenia.  Presented to the ED on IVC reporting that patient was internally preoccupied , cursing at invisible people, not eating , not taking her medications. In ED was hostile , guarded, disorganized , with poor PO intake. Of note, vitals are stable- BP 99/79, pulse 68 At this time patient is poor historian, unable to provide significant information. She presents angry, scowling at writer, becomes easily agitated and loud, stating she does not need to be in the hospital and that people are " lying " . Patient has history of prior admission to [facility] in [facility] for disorganized, agitated behavior. At the time was diagnosed with Schizophrenia. At the time was managed with Lorayne Bender   Dx- Schizophrenia  Plan-  Zyprexa 5 mgrs BID / Tegretol 100 mgrs TID. Agitation Protocol: Haldol/Ativan.  Monitor EKG for QTc interval  Recheck BMP and CBC in AM Encourage PO fluids and PO intake .

## 2017-11-28 ENCOUNTER — Other Ambulatory Visit: Payer: Self-pay

## 2017-11-28 ENCOUNTER — Encounter (HOSPITAL_COMMUNITY): Payer: Self-pay | Admitting: Emergency Medicine

## 2017-11-28 DIAGNOSIS — F29 Unspecified psychosis not due to a substance or known physiological condition: Secondary | ICD-10-CM

## 2017-11-28 DIAGNOSIS — F1099 Alcohol use, unspecified with unspecified alcohol-induced disorder: Secondary | ICD-10-CM

## 2017-11-28 DIAGNOSIS — F17200 Nicotine dependence, unspecified, uncomplicated: Secondary | ICD-10-CM

## 2017-11-28 DIAGNOSIS — R450 Nervousness: Secondary | ICD-10-CM

## 2017-11-28 DIAGNOSIS — G4700 Insomnia, unspecified: Secondary | ICD-10-CM

## 2017-11-28 DIAGNOSIS — Z5320 Procedure and treatment not carried out because of patient's decision for unspecified reasons: Secondary | ICD-10-CM

## 2017-11-28 DIAGNOSIS — F339 Major depressive disorder, recurrent, unspecified: Secondary | ICD-10-CM

## 2017-11-28 LAB — CBC WITH DIFFERENTIAL/PLATELET
BASOS PCT: 0 %
Basophils Absolute: 0 10*3/uL (ref 0.0–0.1)
EOS ABS: 0.1 10*3/uL (ref 0.0–0.7)
EOS PCT: 1 %
HCT: 46.9 % — ABNORMAL HIGH (ref 36.0–46.0)
Hemoglobin: 16.6 g/dL — ABNORMAL HIGH (ref 12.0–15.0)
Lymphocytes Relative: 22 %
Lymphs Abs: 1.5 10*3/uL (ref 0.7–4.0)
MCH: 29.1 pg (ref 26.0–34.0)
MCHC: 35.4 g/dL (ref 30.0–36.0)
MCV: 82.3 fL (ref 78.0–100.0)
MONO ABS: 0.5 10*3/uL (ref 0.1–1.0)
Monocytes Relative: 7 %
Neutro Abs: 4.8 10*3/uL (ref 1.7–7.7)
Neutrophils Relative %: 70 %
PLATELETS: 201 10*3/uL (ref 150–400)
RBC: 5.7 MIL/uL — AB (ref 3.87–5.11)
RDW: 12.7 % (ref 11.5–15.5)
WBC: 6.9 10*3/uL (ref 4.0–10.5)

## 2017-11-28 LAB — COMPREHENSIVE METABOLIC PANEL
ALT: 24 U/L (ref 14–54)
AST: 54 U/L — ABNORMAL HIGH (ref 15–41)
Albumin: 4.6 g/dL (ref 3.5–5.0)
Alkaline Phosphatase: 64 U/L (ref 38–126)
Anion gap: 16 — ABNORMAL HIGH (ref 5–15)
BUN: 30 mg/dL — AB (ref 6–20)
CHLORIDE: 95 mmol/L — AB (ref 101–111)
CO2: 22 mmol/L (ref 22–32)
Calcium: 9.6 mg/dL (ref 8.9–10.3)
Creatinine, Ser: 0.9 mg/dL (ref 0.44–1.00)
GFR calc Af Amer: >60 mL/min (ref >60)
Glucose, Bld: 118 mg/dL — ABNORMAL HIGH (ref 65–99)
POTASSIUM: 3.3 mmol/L — AB (ref 3.5–5.1)
SODIUM: 133 mmol/L — AB (ref 135–145)
Total Bilirubin: 0.6 mg/dL (ref 0.3–1.2)
Total Protein: 8.3 g/dL — ABNORMAL HIGH (ref 6.5–8.1)

## 2017-11-28 LAB — CK: CK TOTAL: 1447 U/L — AB (ref 38–234)

## 2017-11-28 MED ORDER — POTASSIUM CHLORIDE CRYS ER 20 MEQ PO TBCR
40.0000 meq | EXTENDED_RELEASE_TABLET | Freq: Once | ORAL | Status: DC
  Filled 2017-11-28: qty 2

## 2017-11-28 MED ORDER — OLANZAPINE 5 MG PO TBDP
5.0000 mg | ORAL_TABLET | Freq: Two times a day (BID) | ORAL | Status: DC
  Filled 2017-11-28 (×8): qty 1

## 2017-11-28 MED ORDER — SODIUM CHLORIDE 0.9 % IV BOLUS
1000.0000 mL | Freq: Once | INTRAVENOUS | Status: AC
  Administered 2017-11-28: 1000 mL via INTRAVENOUS

## 2017-11-28 MED ORDER — OLANZAPINE 10 MG IM SOLR
5.0000 mg | Freq: Two times a day (BID) | INTRAMUSCULAR | Status: DC
  Administered 2017-11-29 – 2017-11-30 (×3): 5 mg via INTRAMUSCULAR
  Filled 2017-11-28 (×8): qty 10

## 2017-11-28 MED ORDER — OLANZAPINE 10 MG IM SOLR
INTRAMUSCULAR | Status: AC
  Administered 2017-11-28: 18:00:00 via INTRAMUSCULAR
  Filled 2017-11-28: qty 10

## 2017-11-28 NOTE — Progress Notes (Addendum)
Patient laying in her bed with eyes open.  Patient continues to refuse food/drink.  Ginger ale, gatorade, two snacks given patient.  Patient continues to refuse food/drink.  Staff will continue to encourage patient to drink/eat.  Safety maintained with 15 minute checks.

## 2017-11-28 NOTE — BHH Group Notes (Signed)
BHH Group Notes: (Clinical Social Work)   11/28/2017      Type of Therapy:  Group Therapy   Participation Level:  Did Not Attend despite MHT prompting   Ambrose Mantle, LCSW 11/28/2017, 12:58 PM

## 2017-11-28 NOTE — Progress Notes (Signed)
[facility] Second Physician Opinion Progress Note for Medication Administration to Non-consenting Patients (For Involuntarily Committed Patients)  Patient: [name] Date of Birth: [id number] MRN: [id number]  Reason for the Medication: The patient, without the benefit of the specific treatment measure, is incapable of participating in any available treatment plan that will give the patient a realistic opportunity of improving the patient's condition.  Consideration of Side Effects: Consideration of the side effects related to the medication plan has been given.  Rationale for Medication Administration: Patient has been diagnosed with the schizophrenia, presented with decompensated psychosis, irritable, agitated, yelling, screaming, refusing medications, food and drink which leads to orthostatic hypotension and currently unable to care for herself.  At this time it is determined without benefit of the specific treatment measures patient is incapable of participating in any available treatment plan that will give the patient a realistic opportunity of improving the patient condition and also danger to herself and other people.  Recommend forced medication as primary psychiatrist recommends at this time.    Leata Mouse, MD 11/28/17  11:48 AM   This documentation is good for (7) seven days from the date of the MD signature. New documentation must be completed every seven (7) days with detailed justification in the medical record if the patient requires continued non-emergent administration of psychotropic medications.

## 2017-11-28 NOTE — ED Triage Notes (Signed)
Pt sent from [facility] due to not wanting to eat or drink. No other symptoms reported.

## 2017-11-28 NOTE — ED Provider Notes (Signed)
[facility]-EMERGENCY DEPT Provider Note   CSN: [id number] Arrival [id number]: 11/28/17  2013     History   Chief Complaint Chief Complaint  Patient presents with  . not eating    HPI [name] is a 57 y.o. female.  The history is provided by the patient and medical records.  Illness  This is a new problem. The problem occurs constantly. The problem has not changed since onset.Pertinent negatives include no chest pain, no abdominal pain, no headaches and no shortness of breath. Nothing aggravates the symptoms. Nothing relieves the symptoms. She has tried nothing for the symptoms. The treatment provided no relief.    Past Medical History:  Diagnosis Date  . Non compliance w medication regimen   . Schizophrenia [facility])     Patient Active Problem List   Diagnosis Date Noted  . Schizophrenia (HCC) 11/26/2017  . Noncompliance with diet and medication regimen   . Psychoses (HCC)   . Hypokalemic alkalosis 11/11/2011  . Medically noncompliant 11/11/2011  . Schizophrenia, paranoid (HCC) 11/07/2011    History reviewed. No pertinent surgical history.   OB History   None      Home Medications    Prior to Admission medications   Not on File    Family History History reviewed. No pertinent family history.  Social History Social History   Tobacco Use  . Smoking status: Smoker, Current Status Unknown  Substance Use Topics  . Alcohol use: Yes    Comment: Refuses to disclose how much  . Drug use: No    Comment: Refuses to answer     Allergies   Aspirin and Geodon [ziprasidone hcl]   Review of Systems Review of Systems  Unable to perform ROS: Psychiatric disorder  Constitutional: Negative for chills, fatigue and fever.  HENT: Negative for congestion.   Respiratory: Negative for cough, chest tightness, shortness of breath and wheezing.   Cardiovascular: Negative for chest pain.  Gastrointestinal: Negative for abdominal pain,  constipation, diarrhea, nausea and vomiting.  Genitourinary: Negative for dysuria and flank pain.  Musculoskeletal: Negative for back pain, neck pain and neck stiffness.  Skin: Negative for rash and wound.  Neurological: Negative for light-headedness, numbness and headaches.  Psychiatric/Behavioral: Negative for agitation.  All other systems reviewed and are negative.    Physical Exam Updated Vital Signs BP 104/81 (BP Location: Right Arm)   Pulse 84   Temp 98 F (36.7 C) (Oral)   Resp 18   SpO2 99%   Physical Exam  Constitutional: She is oriented to person, place, and time. She appears well-developed and well-nourished. No distress.  HENT:  Head: Normocephalic and atraumatic.  Nose: Nose normal.  Mouth/Throat: Oropharynx is clear and moist. No oropharyngeal exudate.  Eyes: Pupils are equal, round, and reactive to light. Conjunctivae and EOM are normal.  Neck: Normal range of motion.  Cardiovascular: Normal rate and intact distal pulses.  No murmur heard. Pulmonary/Chest: Effort normal and breath sounds normal. No respiratory distress. She has no wheezes. She has no rales. She exhibits no tenderness.  Abdominal: Soft. Bowel sounds are normal. She exhibits no distension. There is no tenderness.  Musculoskeletal: She exhibits no edema or tenderness.  Neurological: She is alert and oriented to person, place, and time. No sensory deficit. She exhibits normal muscle tone.  Skin: Capillary refill takes less than 2 seconds. No rash noted. She is not diaphoretic. No erythema.  Psychiatric: She has a normal mood and affect.  Nursing note and vitals  reviewed.    ED Treatments / Results  Labs (all labs ordered are listed, but only abnormal results are displayed) Labs Reviewed  CBC WITH DIFFERENTIAL/PLATELET - Abnormal; Notable for the following components:      Result Value   RBC 5.70 (*)    Hemoglobin 16.6 (*)    HCT 46.9 (*)    All other components within normal limits    COMPREHENSIVE METABOLIC PANEL - Abnormal; Notable for the following components:   Sodium 133 (*)    Potassium 3.3 (*)    Chloride 95 (*)    Glucose, Bld 118 (*)    BUN 30 (*)    Total Protein 8.3 (*)    AST 54 (*)    Anion gap 16 (*)    All other components within normal limits  CK - Abnormal; Notable for the following components:   Total CK 1,447 (*)    All other components within normal limits  CBC WITH DIFFERENTIAL/PLATELET  BASIC METABOLIC PANEL  CK    EKG None  Radiology No results found.  Procedures Procedures (including critical care time)  Medications Ordered in ED Medications  acetaminophen (TYLENOL) tablet 650 mg (has no administration in time range)  alum & mag hydroxide-simeth (MAALOX/MYLANTA) 200-200-20 MG/5ML suspension 30 mL (has no administration in time range)  magnesium hydroxide (MILK OF MAGNESIA) suspension 30 mL (has no administration in time range)  traZODone (DESYREL) tablet 50 mg (has no administration in time range)  hydrOXYzine (ATARAX/VISTARIL) tablet 50 mg (has no administration in time range)  haloperidol (HALDOL) tablet 5 mg ( Oral See Alternative 11/28/17 0931)    Or  haloperidol lactate (HALDOL) injection 5 mg (5 mg Intramuscular Given 11/28/17 0931)  LORazepam (ATIVAN) tablet 2 mg ( Oral See Alternative 11/28/17 0930)    Or  LORazepam (ATIVAN) injection 2 mg (2 mg Intramuscular Given 11/28/17 0930)  diphenhydrAMINE (BENADRYL) capsule 50 mg ( Oral See Alternative 11/28/17 0930)    Or  diphenhydrAMINE (BENADRYL) injection 50 mg (50 mg Intramuscular Given 11/28/17 0930)  carbamazepine (TEGRETOL) chewable tablet 100 mg (100 mg Oral Not Given 11/28/17 1722)  OLANZapine zydis (ZYPREXA) disintegrating tablet 5 mg (0 mg Oral Not Given 11/28/17 1700)    Or  OLANZapine (ZYPREXA) injection 5 mg ( Intramuscular See Alternative 11/28/17 1700)  potassium chloride SA (K-DUR,KLOR-CON) CR tablet 40 mEq (has no administration in time range)  OLANZapine  (ZYPREXA) 10 MG injection ( Intramuscular Given 11/28/17 1743)  sodium chloride 0.9 % bolus 1,000 mL (1,000 mLs Intravenous New Bag/Given 11/28/17 2251)  sodium chloride 0.9 % bolus 1,000 mL (1,000 mLs Intravenous New Bag/Given 11/28/17 2251)     Initial Impression / Assessment and Plan / ED Course  I have reviewed the triage vital signs and the nursing notes.  Pertinent labs & imaging results that were available during my care of the patient were reviewed by me and considered in my medical decision making (see chart for details).     [name] is a 57 y.o. female with a past medical history significant for schizophrenia who is currently admitted at [facility] who presents for rehydration and evaluation of dehydration.  Patient documentation shows that patient has been admitted for schizophrenia for the last few days at be [facility].  Patient says that she is on a hunger strike and is not eating or drinking.  She denies any physical complaints at this time but says she does not want to be here.  Patient is under IVC.  Patient was  sent for IV fluids and to assess for organ dysfunction with dehydration.  Patient currently denies any complaints and denies any chest pain, shortness breath, palpitations, abdominal pain, nausea vomiting, conservation, diarrhea, dysuria.  She denies any trauma.    On my exam, patient is not willing to answer questions but denies any complaints.  Patient's lungs are clear.  Chest is nontender.  Abdomen is nontender.  Patient moving all extremities and had normal gait.  Based on patient's description of symptoms I am concerned she may be dehydrated with no p.o. intake.  Patient will be given 2 L of fluids and will have screening laboratory testing performed.  At this time no concern for infection however we will hydrate the patient.    If work-up is reassuring and patient's rehydrated successfully, patient will be discharged back to her admission to be [facility].  12:04 AM Patient's labs  showed elevation in hemoglobin, suspect hemoconcentration and dehydration.  Metabolic panel showed mild hypokalemia, oral potassium will be ordered.  Creatinine elevated.  CK was elevated at 1400.  Patient was given 2 L of fluid and reassess.  If repeat CK is downtrending, patient will be stable for discharge back to facility for continued psychiatric treatment.  Next  If CK is rising after fluids, patient will likely admission for continued rehydration prior to going back to be HH.  Care transferred to Dr. Lynelle Doctor while awaiting labs and reassessment.    Final Clinical Impressions(s) / ED Diagnoses   Final diagnoses:  Dehydration  Elevated CK    Clinical Impression: 1. Dehydration   2. Elevated CK     Disposition: Care transferred to Dr. Lynelle Doctor      Masayo Fera, Canary Brim, MD 11/29/17 [id number]

## 2017-11-28 NOTE — Progress Notes (Signed)
Patient did fill out her self inventory form early this morning.  Patient has fair sleep, no sleep medication given.  Good concentration.  Denied depression.  Denied withdrawals.  Denied SI.  Denied physical problems.  Denied physical pain.

## 2017-11-28 NOTE — Progress Notes (Signed)
Idelle Leech from [employer] [phone] called and wanted to know if this patient would allow lab to come to [facility] tonight and draw blood.  Patient refused and Idelle Leech was informed by this nurse.   Idelle Leech stated that when ED starts an IV on this patient, blood work can be drawn before IV fluids are started.  Night shift nurse has been informed of this information.  Again, patient has been offered food/drink but patient has again refused.   Presently patient is laying in bed resting with eyes open.  Respirations even and unlabored.  No signs/symptoms of pain/distress noted on patient's face/body movements.   Safety maintained with 15 minute checks.

## 2017-11-28 NOTE — Progress Notes (Signed)
Patient went to dining room for dinner.  Patient drank 2 glasses of fluids but did not eat any food.  Also, after patient returned to unit after dinner, staff offered patient food/drink again, and patient refused.

## 2017-11-28 NOTE — Progress Notes (Signed)
[facility] transported pt to [facility] for fluids

## 2017-11-28 NOTE — Progress Notes (Addendum)
[facility] MD Progress Note  11/28/2017 2:24 PM [name]  MRN:  [id number]  Subjective:  [name] reports, "I'm ready to depart from this facility. I'm not eating. I don't like the wrong foods you have here"  57 year old female, history of Schizophrenia. Presented to the ED on IVC reporting that patient was internally preoccupied , cursing at invisible people, not eating , not taking her medications. In ED was hostile , guarded, disorganized , with poor PO intake. Of note, vitals are stable- BP 99/79, pulse 68. At this time patient is poor historian, unable to provide significant information. She presents angry, scowling at writer, becomes easily agitated and loud, stating she does not need to be in the hospital and that people are " lying " . Patient has history of prior admission to [facility] in 2016 for disorganized, agitated behavior. At the time was diagnosed with Schizophrenia. At the time was managed with [facility].   11-28-17, [name] is seen, chart reviewed. The chart findings discussed with the treatment team. Patient is alert. She is verbally responsive & aggressive the same time. She is lying down in her bed, not making any eye contact. She says she is ready to depart from this facility. She is refusing to eat or drink fluids. She is refusing her medications. She is not visible on the unit. Her vital signs are trending down some. She is not attending group sessions, not cooperative with staff in any form or level. She may be transferred to the [facility] ED for some fluid infusion to prevent dehydration.  Principal Problem: Schizophrenia (HCC)  Diagnosis:   Patient Active Problem List   Diagnosis Date Noted  . Schizophrenia (HCC) [F20.9] 11/26/2017    Priority: High  . Noncompliance with diet and medication regimen [Z91.11, Z91.14]   . Psychoses (HCC) [F29]   . Hypokalemic alkalosis [E87.3] 11/11/2011  . Medically noncompliant [Z91.19] 11/11/2011  . Schizophrenia, paranoid (HCC) [F20.0] 11/07/2011    Total Time spent with patient: 25 minutes  Past Psychiatric History: See H&P.  Past Medical History:  Past Medical History:  Diagnosis Date  . Non compliance w medication regimen   . Schizophrenia (HCC)    History reviewed. No pertinent surgical history.  Family History: History reviewed. No pertinent family history.  Family Psychiatric  History: See H&P  Social History:  Social History   Substance and Sexual Activity  Alcohol Use Yes   Comment: Refuses to disclose how much     Social History   Substance and Sexual Activity  Drug Use No   Comment: Refuses to answer    Social History   Socioeconomic History  . Marital status: Single    Spouse name: Not on file  . Number of children: Not on file  . Years of education: Not on file  . Highest education level: Not on file  Occupational History  . Not on file  Social Needs  . Financial resource strain: Not on file  . Food insecurity:    Worry: Not on file    Inability: Not on file  . Transportation needs:    Medical: Not on file    Non-medical: Not on file  Tobacco Use  . Smoking status: Smoker, Current Status Unknown  Substance and Sexual Activity  . Alcohol use: Yes    Comment: Refuses to disclose how much  . Drug use: No    Comment: Refuses to answer  . Sexual activity: Not on file    Comment: refused to answer  Lifestyle  . Physical activity:    Days per week: Not on file    Minutes per session: Not on file  . Stress: Not on file  Relationships  . Social connections:    Talks on phone: Not on file    Gets together: Not on file    Attends religious service: Not on file    Active member of club or organization: Not on file    Attends meetings of clubs or organizations: Not on file    Relationship status: Not on file  Other Topics Concern  . Not on file  Social History Narrative  . Not on file   Additional Social History:   Sleep: Good  Appetite:  Poor  Current Medications: Current  Facility-Administered Medications  Medication Dose Route Frequency Provider Last Rate Last Dose  . acetaminophen (TYLENOL) tablet 650 mg  650 mg Oral Q6H PRN Money, Gerlene Burdock, FNP      . alum & mag hydroxide-simeth (MAALOX/MYLANTA) 200-200-20 MG/5ML suspension 30 mL  30 mL Oral Q4H PRN Money, Feliz Beam B, FNP      . carbamazepine (TEGRETOL) chewable tablet 100 mg  100 mg Oral TID Nwoko, Agnes I, NP      . diphenhydrAMINE (BENADRYL) capsule 50 mg  50 mg Oral Q6H PRN Micheal Likens, MD       Or  . diphenhydrAMINE (BENADRYL) injection 50 mg  50 mg Intramuscular Q6H PRN Micheal Likens, MD   50 mg at 11/28/17 0930  . haloperidol (HALDOL) tablet 5 mg  5 mg Oral Q8H PRN Micheal Likens, MD       Or  . haloperidol lactate (HALDOL) injection 5 mg  5 mg Intramuscular Q8H PRN Micheal Likens, MD   5 mg at 11/28/17 0931  . hydrOXYzine (ATARAX/VISTARIL) tablet 50 mg  50 mg Oral Q6H PRN Micheal Likens, MD      . LORazepam (ATIVAN) tablet 2 mg  2 mg Oral Q6H PRN Micheal Likens, MD       Or  . LORazepam (ATIVAN) injection 2 mg  2 mg Intramuscular Q6H PRN Micheal Likens, MD   2 mg at 11/28/17 0930  . magnesium hydroxide (MILK OF MAGNESIA) suspension 30 mL  30 mL Oral Daily PRN Money, Gerlene Burdock, FNP      . OLANZapine zydis (ZYPREXA) disintegrating tablet 5 mg  5 mg Oral BID Cobos, Rockey Situ, MD       Or  . OLANZapine (ZYPREXA) injection 5 mg  5 mg Intramuscular BID Cobos, Rockey Situ, MD      . traZODone (DESYREL) tablet 50 mg  50 mg Oral QHS PRN Money, Gerlene Burdock, FNP       Lab Results: No results found for this or any previous visit (from the past 48 hour(s)).  Blood Alcohol level:  Lab Results  Component Value Date   ETH <10 11/17/2017   ETH <5 03/15/2017   Metabolic Disorder Labs: Lab Results  Component Value Date   HGBA1C 6.1 (H) 04/16/2015   MPG 128 04/16/2015   Lab Results  Component Value Date   PROLACTIN 13.6 04/16/2015   Lab  Results  Component Value Date   CHOL 186 04/16/2015   TRIG 64 04/16/2015   HDL 55 04/16/2015   CHOLHDL 3.4 04/16/2015   VLDL 13 04/16/2015   LDLCALC 118 (H) 04/16/2015   Physical Findings: AIMS: Facial and Oral Movements Muscles of Facial Expression: None, normal Lips and Perioral Area: None, normal  Jaw: None, normal Tongue: None, normal,Extremity Movements Upper (arms, wrists, hands, fingers): None, normal Lower (legs, knees, ankles, toes): None, normal, Trunk Movements Neck, shoulders, hips: None, normal, Overall Severity Severity of abnormal movements (highest score from questions above): None, normal Incapacitation due to abnormal movements: None, normal Patient's awareness of abnormal movements (rate only patient's report): No Awareness, Dental Status Current problems with teeth and/or dentures?: No Does patient usually wear dentures?: No  CIWA:  CIWA-Ar Total: 9 COWS:  COWS Total Score: 5  Musculoskeletal: Strength & Muscle Tone: within normal limits Gait & Station: normal Patient leans: [name]  Psychiatric Specialty Exam: Physical Exam  Nursing note and vitals reviewed.   Review of Systems  Psychiatric/Behavioral: Positive for depression and hallucinations. Negative for memory loss, substance abuse and suicidal ideas. The patient is nervous/anxious and has insomnia.     Blood pressure 94/75, pulse 84, temperature 98 F (36.7 C), temperature source Oral, resp. rate 16.There is no height or weight on file to calculate BMI.  General Appearance: Fairly Groomed  Eye Contact:  Fair  Speech:  loud at times   Volume:  variable  Mood:  states mood is normal, presents angry, irritable  Affect:  angry, hostile  Thought Process:  Disorganized and Descriptions of Associations: Tangential  Orientation:  Other:  fully alert and attentive  Thought Content:  denies hallucinations. Presents guarded, angry, suspicious  Suicidal Thoughts:  No denies suicidal or self injurious  ideations, denies homicidal or violent ideations  Homicidal Thoughts:  No  Memory:  recent and remote fair   Judgement:  Impaired  Insight:  Lacking  Psychomotor Activity:  easily agitated, pacing at times   Concentration:  Concentration: Fair and Attention Span: Fair  Recall:  [employer] of Knowledge:  Fair  Language:  Fair  Akathisia:  Negative  Handed:  Right  AIMS (if indicated):     Assets:  Resilience  ADL's:  Impaired  Cognition:  WNL  Sleep:  Number of Hours: 6.75     Treatment Plan Summary: Daily contact with patient to assess and evaluate symptoms and progress in treatment. Patient currently is refusing meals, fluid & medications.   - Continue inpatient hospitalization.  - Will continue today 11/28/2017 plan as below except where it is noted.  Mood control.     - Will continue Zyprexa zydis disintegrating tablets 5 mg orally bid.      - Or      - Olanzapine injection 5 mg IM bid.  Mood stabilization.      - Continue carbamazepine chewable tablets 100 mg tid.  Agitation/psychosis.      - Continue Haldol 5 mg po or IM Q 8 hrs prn.      - Continue Ativan 2 mg po or IM Q 6 hrs prn.      - Continue Benadryl 50 mg po or IM Q 6 hrs prn.  Anxiety.      - Continue Hydroxyzine 50 mg po Q hrs prn.  Insomnia.      - Continue Trazodone 50 mg po Q bedtime prn.  - Patient to attend & participate in the group milieu. - Discharge planning ongoing.  Will transfer patient to the ED for rehydration. She is refusing to eat or drink fluids. Vs trending down.  Armandina Stammer, NP, PMHNP, FNP-BC 11/28/2017, 2:24 PM   Have reviewed case with NP and Nursing staff . Patient continues to present hostile, paranoid, and has refused to eat or drink, even with significant encouragement  and support from staff  Vitals are stable but she is presenting with orthostatic blood pressure changes. No falls . She has not allowed labs or EKG .  She continues to refuse medications- have started PO  Zyprexa .  * Have asked Dr. Elsie Saas for second opinion regarding medication over objection.  As discussed with staff and NP will continue to offer, encourage food and fluids, monitor vitals- she may require transfer to ED for IV rehydration.   Sallyanne Havers MD

## 2017-11-28 NOTE — Progress Notes (Signed)
Patient again refused all P.O. Meds.  Zyprexa 10 mg IM L deltoid given to patient.  Patient then decided to go to dining room for dinner.  Zyprexa was pulled from pyxis by override.

## 2017-11-28 NOTE — Plan of Care (Signed)
Nurse/staff has attempted to talk with patient about depression, anxiety, coping skills, eating food, drinking fluids.  Patient refuses all attempts to be redirected by staff.

## 2017-11-28 NOTE — Progress Notes (Signed)
Patient refused drink and food this morning.  Patient sitting on bench in her room.  Patient refused all oral medications.  Patient refused to talk to nurse/MHT about her medications and how she is feeling.  Patient sitting on bench, turning her head from side to side, talking to herself.    Benadryl 50 mg IM, Haldol 5 mg IM, Ativan 2 mg IM given patient about 0931 for severe anxiety, agitation and psychosis.  Charge nurse, MHT, two RNs with patient.   Patient continues to sit on bench in her room, turning head from side to side, talking to herself.   Respirations even and unlabored.  Safety maintained with 15 minute checks.

## 2017-11-28 NOTE — Progress Notes (Signed)
Patient presently sitting in dayroom with arms crossed, looking at tv.  Patient had been sitting on bench in her room earlier today.  Patient has repeatedly refused drinks of gatorade, ginger ale, bottled water, lunch room plate of food, soup in cup with lid on cup.  Patient stated the food has something in it that she should not eat.  That she will eat when she goes home.  Patient has refused soups, salads, sandwich, snacks. All staff have talked to her today encouraging her to eat and drink fluids.   Safety maintained with 15 minute checks.

## 2017-11-28 NOTE — Progress Notes (Signed)
Nurse and MHT talked to patient asking her to eat/drink to keep up her strength.  Patient continues to refuse all food/drink, stating that she wants to go home and eat.  Patient stated there is something in the food here and that she will not eat hospital food.  Staff offered to go get her something to eat and she refused that also.  Patient was informed that she may be sent to ER for nutritional IV's.  Patient stated they are not going to send me anywhere but home.

## 2017-11-28 NOTE — ED Notes (Signed)
Bed: WTR5 Expected date:  Expected time:  Means of arrival:  Comments: 

## 2017-11-29 DIAGNOSIS — Z9119 Patient's noncompliance with other medical treatment and regimen: Secondary | ICD-10-CM

## 2017-11-29 LAB — CK
CK TOTAL: 1124 U/L — AB (ref 38–234)
Total CK: 672 U/L — ABNORMAL HIGH (ref 38–234)

## 2017-11-29 MED ORDER — OLANZAPINE 10 MG IM SOLR
INTRAMUSCULAR | Status: AC
  Filled 2017-11-29: qty 10

## 2017-11-29 MED ORDER — BACITRACIN ZINC 500 UNIT/GM EX OINT
TOPICAL_OINTMENT | CUTANEOUS | Status: AC
  Filled 2017-11-29: qty 0.9

## 2017-11-29 MED ORDER — SODIUM CHLORIDE 0.9 % IV BOLUS
1000.0000 mL | Freq: Once | INTRAVENOUS | Status: AC
  Administered 2017-11-29: 1000 mL via INTRAVENOUS

## 2017-11-29 NOTE — Progress Notes (Addendum)
CSW made attempts to contact pt legal guardian, [name].  Primary listed number, [phone], was not inservice.  CSW called tow other numbers on pt face [phone] (Home) and [phone] (mother's number).  No answer at either number. Garner Nash, MSW, LCSW Clinical Social Worker 11/29/2017 11:16 AM   After searching the chart, CSW found that guardian phone number is [phone].  CSW called and left message asking for a return call. Garner Nash, MSW, LCSW Clinical Social Worker 11/29/2017 2:46 PM

## 2017-11-29 NOTE — Plan of Care (Signed)
Nurse discussed depression, anxiety, coping skills with patient.  

## 2017-11-29 NOTE — Progress Notes (Signed)
[facility] MD Progress Note  11/29/2017 11:46 AM [name]  MRN:  [id number] Subjective:    [name] is a 57 y/o F with history of schizophrenia who was admitted from ED on IVC with internal preoccupation, response to internal stimuli, poor oral intake of food/hydration, agitation, and medication non-adherence. Pt had similar presentation to [facility] in 2016 for disorganized, agitated behaviors, at which time she was managed with [facility]. Pt has been irritable, agitated at times, and uncooperative during this admission. Second opinion for forced medications was obtained on 11/28/17. Pt had been refusing to eat or drink during her stay, and she was sent to [facility] for rehydration on 11/28/17 at which time she received 4L of fluids; CK was found to be elevated but trended down while in the ED from 1447->1124->672 after 4L of IVF. She was returned to [facility] for additional treatment and stabilzation.  Today upon evaluation, pt is irritable, disorganized, and generally uncooperative with interview. She shares, "I've been going over the government sucking ass and they want to run over my house. I'm preserving my house, but they're running over my grandmother's place. It's about control. I just want to be at home." Attempted to have patient share narrative of the events which lead to her hospitalization, but she grew increasingly irritable during interview. Asked pt about her sister whom is her guardian, and pt responded, "She's not my sister - she's evil." RN staff report pt has been taking some fluids at breakfast this morning, and pt was asked about her intake of food/fluids today, and she responded, "I need to gain weight, but not here. They are just going to piss me off." Pt then abruptly left the interview and slammed the door.   Principal Problem: Schizophrenia ([place]) Diagnosis:   Patient Active Problem List   Diagnosis Date Noted  . Schizophrenia ([place]) [F20.9] 11/26/2017  . Noncompliance with diet and medication  regimen [Z91.11, Z91.14]   . Psychoses ([place]) [F29]   . Hypokalemic alkalosis [E87.3] 11/11/2011  . Medically noncompliant [Z91.19] 11/11/2011  . Schizophrenia, paranoid ([place]) [F20.0] 11/07/2011   Total Time spent with patient: 30 minutes  Past Psychiatric History: see H&P  Past Medical History:  Past Medical History:  Diagnosis Date  . Non compliance w medication regimen   . Schizophrenia ([place])    History reviewed. No pertinent surgical history. Family History: History reviewed. No pertinent family history. Family Psychiatric  History: see H&P Social History:  Social History   Substance and Sexual Activity  Alcohol Use Yes   Comment: Refuses to disclose how much     Social History   Substance and Sexual Activity  Drug Use No   Comment: Refuses to answer    Social History   Socioeconomic History  . Marital status: Single    Spouse name: Not on file  . Number of children: Not on file  . Years of education: Not on file  . Highest education level: Not on file  Occupational History  . Not on file  Social Needs  . Financial resource strain: Not on file  . Food insecurity:    Worry: Not on file    Inability: Not on file  . Transportation needs:    Medical: Not on file    Non-medical: Not on file  Tobacco Use  . Smoking status: Current Some Day Smoker    Types: Cigarettes  . Smokeless tobacco: Never Used  Substance and Sexual Activity  . Alcohol use: Yes    Comment: Refuses  to disclose how much  . Drug use: No    Comment: Refuses to answer  . Sexual activity: Not on file    Comment: refused to answer  Lifestyle  . Physical activity:    Days per week: Not on file    Minutes per session: Not on file  . Stress: Not on file  Relationships  . Social connections:    Talks on phone: Not on file    Gets together: Not on file    Attends religious service: Not on file    Active member of club or organization: Not on file    Attends meetings of clubs or  organizations: Not on file    Relationship status: Not on file  Other Topics Concern  . Not on file  Social History Narrative  . Not on file   Additional Social History:                         Sleep: Good  Appetite:  Poor  Current Medications: Current Facility-Administered Medications  Medication Dose Route Frequency Provider Last Rate Last Dose  . acetaminophen (TYLENOL) tablet 650 mg  650 mg Oral Q6H PRN Money, Lowry Ram, FNP      . alum & mag hydroxide-simeth (MAALOX/MYLANTA) 200-200-20 MG/5ML suspension 30 mL  30 mL Oral Q4H PRN Money, Darnelle Maffucci B, FNP      . bacitracin 500 UNIT/GM ointment           . carbamazepine (TEGRETOL) chewable tablet 100 mg  100 mg Oral TID Lindell Spar I, NP      . diphenhydrAMINE (BENADRYL) capsule 50 mg  50 mg Oral Q6H PRN Pennelope Bracken, MD       Or  . diphenhydrAMINE (BENADRYL) injection 50 mg  50 mg Intramuscular Q6H PRN Pennelope Bracken, MD   50 mg at 11/29/17 1020  . haloperidol (HALDOL) tablet 5 mg  5 mg Oral Q8H PRN Pennelope Bracken, MD       Or  . haloperidol lactate (HALDOL) injection 5 mg  5 mg Intramuscular Q8H PRN Pennelope Bracken, MD   5 mg at 11/28/17 0931  . hydrOXYzine (ATARAX/VISTARIL) tablet 50 mg  50 mg Oral Q6H PRN Pennelope Bracken, MD      . LORazepam (ATIVAN) tablet 2 mg  2 mg Oral Q6H PRN Pennelope Bracken, MD       Or  . LORazepam (ATIVAN) injection 2 mg  2 mg Intramuscular Q6H PRN Pennelope Bracken, MD   2 mg at 11/28/17 0930  . magnesium hydroxide (MILK OF MAGNESIA) suspension 30 mL  30 mL Oral Daily PRN Money, Lowry Ram, FNP      . OLANZapine zydis (ZYPREXA) disintegrating tablet 5 mg  5 mg Oral BID Cobos, Myer Peer, MD       Or  . OLANZapine (ZYPREXA) injection 5 mg  5 mg Intramuscular BID Cobos, Myer Peer, MD   5 mg at 11/29/17 1019  . potassium chloride SA (K-DUR,KLOR-CON) CR tablet 40 mEq  40 mEq Oral Once Tegeler, Gwenyth Allegra, MD      . traZODone  (DESYREL) tablet 50 mg  50 mg Oral QHS PRN Money, Lowry Ram, FNP        Lab Results:  Results for orders placed or performed during the hospital encounter of 11/26/17 (from the past 48 hour(s))  CBC with Differential     Status: Abnormal   Collection Time: 11/28/17 10:45 PM  Result Value Ref Range   WBC 6.9 4.0 - 10.5 K/uL   RBC 5.70 (H) 3.87 - 5.11 MIL/uL   Hemoglobin 16.6 (H) 12.0 - 15.0 g/dL   HCT 46.9 (H) 36.0 - 46.0 %   MCV 82.3 78.0 - 100.0 fL   MCH 29.1 26.0 - 34.0 pg   MCHC 35.4 30.0 - 36.0 g/dL   RDW 12.7 11.5 - 15.5 %   Platelets 201 150 - 400 K/uL   Neutrophils Relative % 70 %   Neutro Abs 4.8 1.7 - 7.7 K/uL   Lymphocytes Relative 22 %   Lymphs Abs 1.5 0.7 - 4.0 K/uL   Monocytes Relative 7 %   Monocytes Absolute 0.5 0.1 - 1.0 K/uL   Eosinophils Relative 1 %   Eosinophils Absolute 0.1 0.0 - 0.7 K/uL   Basophils Relative 0 %   Basophils Absolute 0.0 0.0 - 0.1 K/uL    Comment: Performed at [facility], [place] [address] [place], [place] [address]  Comprehensive metabolic panel     Status: Abnormal   Collection Time: 11/28/17 10:45 PM  Result Value Ref Range   Sodium 133 (L) 135 - 145 mmol/L   Potassium 3.3 (L) 3.5 - 5.1 mmol/L   Chloride 95 (L) 101 - 111 mmol/L   CO2 22 22 - 32 mmol/L   Glucose, Bld 118 (H) 65 - 99 mg/dL   BUN 30 (H) 6 - 20 mg/dL   Creatinine, Ser 0.90 0.44 - 1.00 mg/dL   Calcium 9.6 8.9 - 10.3 mg/dL   Total Protein 8.3 (H) 6.5 - 8.1 g/dL   Albumin 4.6 3.5 - 5.0 g/dL   AST 54 (H) 15 - 41 U/L   ALT 24 14 - 54 U/L   Alkaline Phosphatase 64 38 - 126 U/L   Total Bilirubin 0.6 0.3 - 1.2 mg/dL   GFR calc non Af Amer >60 >60 mL/min   GFR calc Af Amer >60 >60 mL/min    Comment: (NOTE) The eGFR has been calculated using the CKD EPI equation. This calculation has not been validated in all clinical situations. eGFR's persistently <60 mL/min signify possible Chronic Kidney Disease.    Anion gap 16 (H) 5 - 15    Comment: Performed at  [facility], [place] [address], [place], [place] [address]  CK     Status: Abnormal   Collection Time: 11/28/17 10:45 PM  Result Value Ref Range   Total CK 1,447 (H) 38 - 234 U/L    Comment: Performed at [facility], [place] [address], [place], [place] [address]  CK     Status: Abnormal   Collection Time: 11/29/17  2:09 AM  Result Value Ref Range   Total CK 1,124 (H) 38 - 234 U/L    Comment: Performed at [facility], [place] [address], [place], [place] [address]  CK     Status: Abnormal   Collection Time: 11/29/17  5:01 AM  Result Value Ref Range   Total CK 672 (H) 38 - 234 U/L    Comment: Performed at [facility], [place] [address], [place], [place] [address]    Blood Alcohol level:  Lab Results  Component Value Date   [facility] <10 11/17/2017   ETH <5 09/38/1829    Metabolic Disorder Labs: Lab Results  Component Value Date   HGBA1C 6.1 (H) 04/16/2015   MPG 128 04/16/2015   Lab Results  Component Value Date   PROLACTIN 13.6  04/16/2015   Lab Results  Component Value Date   CHOL 186 04/16/2015   TRIG 64 04/16/2015   HDL 55 04/16/2015   CHOLHDL 3.4 04/16/2015   VLDL 13 04/16/2015   LDLCALC 118 (H) 04/16/2015    Physical Findings: AIMS: Facial and Oral Movements Muscles of Facial Expression: None, normal Lips and Perioral Area: None, normal Jaw: None, normal Tongue: None, normal,Extremity Movements Upper (arms, wrists, hands, fingers): None, normal Lower (legs, knees, ankles, toes): None, normal, Trunk Movements Neck, shoulders, hips: None, normal, Overall Severity Severity of abnormal movements (highest score from questions above): None, normal Incapacitation due to abnormal movements: None, normal Patient's awareness of abnormal movements (rate only patient's report): No Awareness, Dental Status Current problems with teeth and/or dentures?: No Does patient usually wear dentures?: No  CIWA:  CIWA-Ar  Total: 9 COWS:  COWS Total Score: 5  Musculoskeletal: Strength & Muscle Tone: within normal limits Gait & Station: normal Patient leans: [name]  Psychiatric Specialty Exam: Physical Exam  Nursing note and vitals reviewed.   Review of Systems  Constitutional: Negative for chills and fever.  Respiratory: Negative for cough and shortness of breath.   Cardiovascular: Negative for chest pain.  Gastrointestinal: Negative for abdominal pain, heartburn, nausea and vomiting.  Psychiatric/Behavioral: The patient is nervous/anxious.     Blood pressure 94/72, pulse (!) 56, temperature (!) 97.4 F (36.3 C), temperature source Oral, resp. rate 17, SpO2 99 %.There is no height or weight on file to calculate BMI.  General Appearance: Casual and Disheveled  Eye Contact:  Fair  Speech:  Clear and Coherent and Normal Rate  Volume:  Normal  Mood:  Anxious, Dysphoric and Irritable  Affect:  Blunt and Labile  Thought Process:  Disorganized, Goal Directed and Descriptions of Associations: Loose  Orientation:  Full (Time, Place, and Person)  Thought Content:  Delusions, Hallucinations: internally preoccupied, Ideas of Reference:   Paranoia Delusions and Paranoid Ideation  Suicidal Thoughts:  Unable to assess  Homicidal Thoughts:  Unable to assess  Memory:  Immediate;   Fair  Judgement:  Poor  Insight:  Lacking  Psychomotor Activity:  Normal  Concentration:  Concentration: Poor  Recall:  [employer] of Knowledge:  Fair  Language:  Fair  Akathisia:  No  Handed:    AIMS (if indicated):     Assets:  Resilience  ADL's:  Intact  Cognition:  WNL  Sleep:  Number of Hours: 6.75   Treatment Plan Summary: Daily contact with patient to assess and evaluate symptoms and progress in treatment and Medication management   -Continue inpatient hospitalization  -Second opinion for forced medications completed on 11/28/17 (see note)  -Schizophrenia   -Continue zyprexa '5mg'$  po/IM BID   -Continue tegretol '100mg'$   po TID  -Agitation   -Continue haldol '5mg'$  po/IM q8h prn agitation   -Continue benadryl '50mg'$  po/IM q6h prn agitation   -Continue Ativan '2mg'$  po/IM q6h prn agitation  -Anxiety  -Continue vistaril '50mg'$  po q6h prn anxiety  -Insomnia   -Continue trazodone '50mg'$  po qhs prn insomnia  -Encourage participation in groups and therapeutic milieu  -Disposition planning will be ongoing  Pennelope Bracken, MD 11/29/2017, 11:46 AM

## 2017-11-29 NOTE — Progress Notes (Addendum)
D:  Patient's self inventory sheet, patient has fair sleep, no sleep medication.  Fair appetite, normal energy level, good concentration.  Denied depression, hopeless and anxiety.  Denied withdrawals.  Denied physical problems.  A:  Patient continues to refuse all P.O. Medications.  Patient does take IM injections.  Emotional support and encouragement given patient. R:  Patient denied SI and HI.  Patient would not answer A/V hallucinations.  Patient stated she is not going to answer any more questions.  Safety maintained with 15 minute checks. Patient went to dining room for lunch today.  Patient drank two cups of fluids but did not eat any food.   Patient has repeatedly been asked today by staff to eat/drink fluids so she will feel better.  Patient becomes easily angered when staff asks her questions and encouraged her to eat/drink.  Patient stated she does not have any goals, no plans.  Stated she was forced to come here.  Refused to sign paper for SW concerning her goals.

## 2017-11-29 NOTE — ED Provider Notes (Signed)
Patient was left at change of shift to get IV fluids and recheck a CK level.  When they were rechecked they were improving but still above 1000.  A second bolus of 2000 cc was ordered.  At about 4:30 AM we were contacted by behavioral health and they state the patient's commitment papers or IVC papers had expired.  They were wanting me to redo them.  When I look at the papers they were done on May 1 and were for 7 days.  I redid the IVC papers for 1 day to cover her treatment in the ED.  When I talked to the patient she states she does not understand why we are concentrating on given her IV fluids.  She has almost finished her third and fourth liter and she states she starting to have good urinary output.  Her tongue is now moist.  She states we need to be concentrating on why she will not eat.  When I asked her why she will not eat she states "they are not giving me the proper food".  When these last 2 L, a total of 4000 cc are given I am going to do a another CK, hopefully if it is below 1000 she can be transferred back to behavioral health to finish her psychiatric treatment.  06:20 AM patient's CK has improved into the 600's, she was released back to [facility].   Results for orders placed or performed during the hospital encounter of 11/26/17  CK  Result Value Ref Range   Total CK 1,447 (H) 38 - 234 U/L  CK  Result Value Ref Range   Total CK 1,124 (H) 38 - 234 U/L  CK  Result Value Ref Range   Total CK 672 (H) 38 - 234 U/L    Devoria Albe, MD, Concha Pyo, MD 11/29/17 [phone]

## 2017-11-29 NOTE — Discharge Instructions (Signed)
She was given 4000 cc of NS IV fluids. She had an elevation of her CK enzyme which resolved with the IV fluids. She no longer appears dehydrated.

## 2017-11-29 NOTE — [facility] Suicide Risk Assessment (Signed)
[facility] INPATIENT:  Family/Significant Other Suicide Prevention Education  Suicide Prevention Education:  Education Completed; [name], legal guardian, [phone], has been identified by the patient as the family member/significant other with whom the patient will be residing, and identified as the person(s) who will aid the patient in the event of a mental health crisis (suicidal ideations/suicide attempt).  With written consent from the patient, the family member/significant other has been provided the following suicide prevention education, prior to the and/or following the discharge of the patient.  The suicide prevention education provided includes the following:  Suicide risk factors  Suicide prevention and interventions  National Suicide Hotline telephone number  [facility] assessment telephone number  [facility] Emergency Assistance 911  [facility] and/or Residential Mobile Crisis Unit telephone number  Request made of family/significant other to:  Remove weapons (e.g., guns, rifles, knives), all items previously/currently identified as safety concern.  No guns in the home.  Remove drugs/medications (over-the-counter, prescriptions, illicit drugs), all items previously/currently identified as a safety concern.  The family member/significant other verbalizes understanding of the suicide prevention education information provided.  The family member/significant other agrees to remove the items of safety concern listed above.  [name] said that pt has been off meds for the past 9-10 months and did OK for a while.  Last 30 days have been worse.  Very concerned about her not eating.  Preference would be for her to be transferred to the [place].  Lorri Frederick, LCSW 11/29/2017, 3:00 PM

## 2017-11-29 NOTE — Plan of Care (Signed)
D: Pt stays in room, pt continues to refuse to eat. Pt forwards little information, pt irritable.  A: Pt was offered support and encouragement. Pt was given scheduled medications. Pt was encourage to attend groups. Q 15 minute checks were done for safety.  R: safety maintained on unit.   Problem: Safety: Goal: Periods of time without injury will increase Outcome: Progressing

## 2017-11-29 NOTE — ED Notes (Signed)
Pt still refusing to eat or drink. Dr.Tegeler notified of pt refusal to taking K-dur. Pt is to have labs redrawn after fluids finish and then determination on admission or discharge back to [facility] will be made.

## 2017-11-29 NOTE — Progress Notes (Signed)
Patient went to dining room tonight and drank two glasses of fluids.  Did not eat any food.

## 2017-11-29 NOTE — [facility] Group Notes (Signed)
[facility] LCSW Group Therapy Note  Date/Time: 11/29/17, 1315  Type of Therapy and Topic:  Group Therapy:  Overcoming Obstacles  Participation Level:  moderate  Description of Group:    In this group patients will be encouraged to explore what they see as obstacles to their own wellness and recovery. They will be guided to discuss their thoughts, feelings, and behaviors related to these obstacles. The group will process together ways to cope with barriers, with attention given to specific choices patients can make. Each patient will be challenged to identify changes they are motivated to make in order to overcome their obstacles. This group will be process-oriented, with patients participating in exploration of their own experiences as well as giving and receiving support and challenge from other group members.  Therapeutic Goals: 1. Patient will identify personal and current obstacles as they relate to admission. 2. Patient will identify barriers that currently interfere with their wellness or overcoming obstacles.  3. Patient will identify feelings, thought process and behaviors related to these barriers. 4. Patient will identify two changes they are willing to make to overcome these obstacles:    Summary of Patient Progress: Pt shared that her obstacle was "entrapment" and told the group that people had lied about her to get her taken to the hospital.  Pt was calm as she shared this and appropriate throughout group.      Therapeutic Modalities:   Cognitive Behavioral Therapy Solution Focused Therapy Motivational Interviewing Relapse Prevention Therapy  [employer]en [employer], LCSW

## 2017-11-29 NOTE — Progress Notes (Signed)
Recreation Therapy Notes  5.13.19 @ 1358:  LRT attempted to complete assessment with pt.  Pt refused stating "I won't be here long enough to do it".  Pt was pleasant.  LRT will attempt to complete assessment tomorrow.    Caroll Rancher, LRT/CTRS      Lillia Abed, Kethan Papadopoulos A 11/29/2017 2:00 PM

## 2017-11-29 NOTE — Progress Notes (Signed)
Disposition CSW received call from Pt's sister and legal guardian, Orsis Preziosi-Lennon.  Sister was not made aware that patient had been moved, first to [facility] on Friday and then to [facility]  CSW reviewed patient's medical status with Ms. Erhart-Lennon regarding her receiving IV fluids due to dehydration and related that her labs were improving but she was still requiring IV fluids.    CSW indicated that we would make every effort to notify her when patient is moved anywhere within the [facility] system.  CSW will put an additional note in the FYI section noting that legal gurdian needs to be contacted whenever patient is moved within the system.  Timmothy Euler. Kaylyn Lim, MSW, LCSWA Disposition Clinical Social Work [phone] (cell) [phone] (office)

## 2017-11-29 NOTE — Progress Notes (Signed)
CSW left HIPAA compliant message for pt's sister, Dilan, [place] regarding pt's return to [facility] [address] in the Adult Unit.  Timmothy Euler. Kaylyn Lim, MSW, LCSWA Disposition Clinical Social Work [phone] (cell) [phone] (office) .

## 2017-11-29 NOTE — [facility] Group Notes (Signed)
Patient did not attend Orientation and Goals Group.

## 2017-11-29 NOTE — ED Notes (Signed)
Pt refusing to have blood drawn.

## 2017-11-29 NOTE — Tx Team (Signed)
Interdisciplinary Treatment and Diagnostic Plan Update  11/29/2017 Time of Session: 0940 [name] MRN: [id number]  Principal Diagnosis: Schizophrenia [facility])  Secondary Diagnoses: Principal Problem:   Schizophrenia ([place])   Current Medications:  Current Facility-Administered Medications  Medication Dose Route Frequency Provider Last Rate Last Dose  . acetaminophen (TYLENOL) tablet 650 mg  650 mg Oral Q6H PRN Money, Lowry Ram, FNP      . alum & mag hydroxide-simeth (MAALOX/MYLANTA) 200-200-20 MG/5ML suspension 30 mL  30 mL Oral Q4H PRN Money, Darnelle Maffucci B, FNP      . bacitracin 500 UNIT/GM ointment           . carbamazepine (TEGRETOL) chewable tablet 100 mg  100 mg Oral TID Lindell Spar I, NP      . diphenhydrAMINE (BENADRYL) capsule 50 mg  50 mg Oral Q6H PRN Pennelope Bracken, MD       Or  . diphenhydrAMINE (BENADRYL) injection 50 mg  50 mg Intramuscular Q6H PRN Pennelope Bracken, MD   50 mg at 11/29/17 1020  . haloperidol (HALDOL) tablet 5 mg  5 mg Oral Q8H PRN Pennelope Bracken, MD       Or  . haloperidol lactate (HALDOL) injection 5 mg  5 mg Intramuscular Q8H PRN Pennelope Bracken, MD   5 mg at 11/28/17 0931  . hydrOXYzine (ATARAX/VISTARIL) tablet 50 mg  50 mg Oral Q6H PRN Pennelope Bracken, MD      . LORazepam (ATIVAN) tablet 2 mg  2 mg Oral Q6H PRN Pennelope Bracken, MD       Or  . LORazepam (ATIVAN) injection 2 mg  2 mg Intramuscular Q6H PRN Pennelope Bracken, MD   2 mg at 11/28/17 0930  . magnesium hydroxide (MILK OF MAGNESIA) suspension 30 mL  30 mL Oral Daily PRN Money, Lowry Ram, FNP      . OLANZapine zydis (ZYPREXA) disintegrating tablet 5 mg  5 mg Oral BID Cobos, Myer Peer, MD       Or  . OLANZapine (ZYPREXA) injection 5 mg  5 mg Intramuscular BID Cobos, Myer Peer, MD   5 mg at 11/29/17 1019  . potassium chloride SA (K-DUR,KLOR-CON) CR tablet 40 mEq  40 mEq Oral Once Tegeler, Gwenyth Allegra, MD      . traZODone (DESYREL) tablet  50 mg  50 mg Oral QHS PRN Money, Lowry Ram, FNP       PTA Medications: No medications prior to admission.    Patient Stressors:    Patient Strengths:    Treatment Modalities: Medication Management, Group therapy, Case management,  1 to 1 session with clinician, Psychoeducation, Recreational therapy.   Physician Treatment Plan for Primary Diagnosis: Schizophrenia ([place]) Long Term Goal(s): Improvement in symptoms so as ready for discharge Improvement in symptoms so as ready for discharge   Short Term Goals: Ability to verbalize feelings will improve Compliance with prescribed medications will improve  Medication Management: Evaluate patient's response, side effects, and tolerance of medication regimen.  Therapeutic Interventions: 1 to 1 sessions, Unit Group sessions and Medication administration.  Evaluation of Outcomes: Not Met  Physician Treatment Plan for Secondary Diagnosis: Principal Problem:   Schizophrenia ([place])  Long Term Goal(s): Improvement in symptoms so as ready for discharge Improvement in symptoms so as ready for discharge   Short Term Goals: Ability to verbalize feelings will improve Compliance with prescribed medications will improve     Medication Management: Evaluate patient's response, side effects, and tolerance of medication regimen.  Therapeutic  Interventions: 1 to 1 sessions, Unit Group sessions and Medication administration.  Evaluation of Outcomes: Not Met   RN Treatment Plan for Primary Diagnosis: Schizophrenia ([place]) Long Term Goal(s): Knowledge of disease and therapeutic regimen to maintain health will improve  Short Term Goals: Ability to identify and develop effective coping behaviors will improve and Compliance with prescribed medications will improve  Medication Management: RN will administer medications as ordered by provider, will assess and evaluate patient's response and provide education to patient for prescribed medication. RN will  report any adverse and/or side effects to prescribing provider.  Therapeutic Interventions: 1 on 1 counseling sessions, Psychoeducation, Medication administration, Evaluate responses to treatment, Monitor vital signs and CBGs as ordered, Perform/monitor CIWA, COWS, AIMS and Fall Risk screenings as ordered, Perform wound care treatments as ordered.  Evaluation of Outcomes: Not Met   LCSW Treatment Plan for Primary Diagnosis: Schizophrenia ([place]) Long Term Goal(s): Safe transition to appropriate next level of care at discharge, Engage patient in therapeutic group addressing interpersonal concerns.  Short Term Goals: Engage patient in aftercare planning with referrals and resources, Increase social support and Increase skills for wellness and recovery  Therapeutic Interventions: Assess for all discharge needs, 1 to 1 time with Social worker, Explore available resources and support systems, Assess for adequacy in community support network, Educate family and significant other(s) on suicide prevention, Complete Psychosocial Assessment, Interpersonal group therapy.  Evaluation of Outcomes: Not Met   Progress in Treatment: Attending groups: No. Participating in groups: No. Taking medication as prescribed: No. Toleration medication: No. Family/Significant other contact made: No, will contact:  sister Patient understands diagnosis: No. Discussing patient identified problems/goals with staff: No. Medical problems stabilized or resolved: Yes. Denies suicidal/homicidal ideation: Yes. Issues/concerns per patient self-inventory: No. Other: none  New problem(s) identified: No, Describe:  none  New Short Term/Long Term Goal(s):Pt goals: "I have not goals at all because I was forced into the hospital."  Discharge Plan or Barriers:   Reason for Continuation of Hospitalization: Aggression Delusions  Hallucinations Medication stabilization  Estimated Length of Stay: 3-5  days.  Attendees: Patient: [name] 11/29/2017   Physician: Dr Nancy Fetter 11/29/2017   Nursing: Grayland Ormond, RN 11/29/2017   RN Care Manager: 11/29/2017   Social Worker: Lurline Idol, LCSW 11/29/2017   Recreational Therapist:  11/29/2017   Other:  11/29/2017   Other:  11/29/2017   Other: 11/29/2017        Scribe for Treatment Team: Joanne Chars, [facility] 11/29/2017 10:48 AM

## 2017-11-29 NOTE — Progress Notes (Signed)
Recreation Therapy Notes  Date: 5.13.19 Time: 0950 Location: 500 Hall Dayroom  Group Topic: Goal Setting  Goal Area(s) Addresses:  Patient will be able to identify at least 3 life goals.  Patient will be able to identify benefit of investing in goals.  Patient will be able to identify benefit of setting life goals.   Intervention: Worksheet  Activity: Goal Planning.  Patients were to identify goals they wanted to accomplish in a week, month, year and 5 years.  Patients were to then identify any obstacles they would face, what they need to accomplish their goal and what they can start doing to reach their goals.  Education:  Discharge Planning, Coping Skills, Leisure Education   Education Outcome: Acknowledges Education/In Group Clarification Provided/Needs Additional Education  Clinical Observations: Pt did not attend group.    Lova Urbieta, LRT/CTRS         Elynor Kallenberger A 11/29/2017 11:12 AM 

## 2017-11-29 NOTE — Plan of Care (Signed)
Nurse discussed anxiety, depression, coping skills with patient. 

## 2017-11-30 DIAGNOSIS — Z9114 Patient's other noncompliance with medication regimen: Secondary | ICD-10-CM

## 2017-11-30 DIAGNOSIS — F17210 Nicotine dependence, cigarettes, uncomplicated: Secondary | ICD-10-CM

## 2017-11-30 DIAGNOSIS — F5089 Other specified eating disorder: Secondary | ICD-10-CM

## 2017-11-30 MED ORDER — PALIPERIDONE PALMITATE ER 156 MG/ML IM SUSY
156.0000 mg | PREFILLED_SYRINGE | INTRAMUSCULAR | Status: DC
  Filled 2017-11-30: qty 1

## 2017-11-30 MED ORDER — PALIPERIDONE PALMITATE ER 234 MG/1.5ML IM SUSY
234.0000 mg | PREFILLED_SYRINGE | Freq: Once | INTRAMUSCULAR | Status: AC
  Administered 2017-11-30: 234 mg via INTRAMUSCULAR
  Filled 2017-11-30: qty 1.5

## 2017-11-30 NOTE — Progress Notes (Signed)
D:Pt is irritable and isolative. Pt refused PO medications . Pt's lt and rt arm have pink irritated areas. MD made aware. When injection was being given in  lt Deltoid, pt pulled arm away as soon as needle was placed in her arm. Pt refuses injection in her gluteal and will only allow staff to give medication in her deltoid muscles. Injection given in rt Deltoid.   A:Pt is being encouraged to drink fluids and take PO medications. Support and 15 minute checks given.  R:Pt denies si and hi. Safety maintained on the unit.

## 2017-11-30 NOTE — Progress Notes (Signed)
[facility] MD Progress Note  11/30/2017 2:58 PM [name]  MRN:  [id number]  Subjective: [name] reports, "I'm just here. I'm okay. I just want to get out of here, that is all. I'm not interested in the food you have here. I don't want any medicines by mouth. Give me injection, I don't give a damn. If I take pills, I got to eat. But, you can get injections if you are not eating food. I made it clear to you that I'm not eating any food. You can't help me here. I'm waiting on my public defender to come get me out of this place".   Usha Feng is a 57 y/o F with history of schizophrenia who was admitted from ED on IVC with internal preoccupation, response to internal stimuli, poor oral intake of food/hydration, agitation, and medication non-adherence. Pt had similar presentation to [facility] in 2016 for disorganized, agitated behaviors, at which time she was managed with Invega. Pt has been irritable, agitated at times, and uncooperative during this admission. Second opinion for forced medications was obtained on 11/28/17. Pt had been refusing to eat or drink during her stay, and she was sent to [facility] for rehydration on 11/28/17 at which time she received 4L of fluids; CK was found to be elevated but trended down while in the ED from 1447->1124->672 after 4L of IVF. She was returned to [facility] for additional treatment and stabilzation.  Today upon evaluation, [name] presents irritable, blunt, disorganized & uncooperative with interview. She reports, "I'm just here. I'm okay. I just want to get out of here, that is all. I'm not interested in the food you have here. I don't want any medicines by mouth. Give me injection, I don't give a damn. If I take pills, I got to eat. But, you can get injections if you are not eating food. I made it clear to you that I'm not eating any food. You can't help me here. I'm waiting on my public defender to come get me out of this place". Patient is visible on the unit. She is observed sitting in the  dayroom. She is started on the [place] ijection 234 mg today & 156 mg/ml IM on 12-03-17, then 30 days from there onwards. She was previously on the Invega injectable. This was initiated with hope that since patient prefers injectables more than po medicines, this will aid in her medication compliance & a more stable mood after discharge. Her Zyprexa has been discontinued today. Will continue current plan of care already in progress. Staff report pt has been taking some fluids .  Principal Problem: Schizophrenia ([place]) Diagnosis:   Patient Active Problem List   Diagnosis Date Noted  . Schizophrenia ([place]) [F20.9] 11/26/2017    Priority: High  . Noncompliance with diet and medication regimen [Z91.11, Z91.14]   . Psychoses ([place]) [F29]   . Hypokalemic alkalosis [E87.3] 11/11/2011  . Medically noncompliant [Z91.19] 11/11/2011  . Schizophrenia, paranoid ([place]) [F20.0] 11/07/2011   Total Time spent with patient: 15 minutes  Past Psychiatric History: See H&P  Past Medical History:  Past Medical History:  Diagnosis Date  . Non compliance w medication regimen   . Schizophrenia ([place])    History reviewed. No pertinent surgical history.  Family History: History reviewed. No pertinent family history.  Family Psychiatric  History: See H&P  Social History:  Social History   Substance and Sexual Activity  Alcohol Use Yes   Comment: Refuses to disclose how much     Social  History   Substance and Sexual Activity  Drug Use No   Comment: Refuses to answer    Social History   Socioeconomic History  . Marital status: Single    Spouse name: Not on file  . Number of children: Not on file  . Years of education: Not on file  . Highest education level: Not on file  Occupational History  . Not on file  Social Needs  . Financial resource strain: Not on file  . Food insecurity:    Worry: Not on file    Inability: Not on file  . Transportation needs:    Medical: Not on file     Non-medical: Not on file  Tobacco Use  . Smoking status: Current Some Day Smoker    Types: Cigarettes  . Smokeless tobacco: Never Used  Substance and Sexual Activity  . Alcohol use: Yes    Comment: Refuses to disclose how much  . Drug use: No    Comment: Refuses to answer  . Sexual activity: Not on file    Comment: refused to answer  Lifestyle  . Physical activity:    Days per week: Not on file    Minutes per session: Not on file  . Stress: Not on file  Relationships  . Social connections:    Talks on phone: Not on file    Gets together: Not on file    Attends religious service: Not on file    Active member of club or organization: Not on file    Attends meetings of clubs or organizations: Not on file    Relationship status: Not on file  Other Topics Concern  . Not on file  Social History Narrative  . Not on file   Additional Social History:   Sleep: Good  Appetite:  Poor  Current Medications: Current Facility-Administered Medications  Medication Dose Route Frequency Provider Last Rate Last Dose  . acetaminophen (TYLENOL) tablet 650 mg  650 mg Oral Q6H PRN Money, Lowry Ram, FNP      . alum & mag hydroxide-simeth (MAALOX/MYLANTA) 200-200-20 MG/5ML suspension 30 mL  30 mL Oral Q4H PRN Money, Lowry Ram, FNP      . carbamazepine (TEGRETOL) chewable tablet 100 mg  100 mg Oral TID Ximenna Fonseca I, NP      . diphenhydrAMINE (BENADRYL) capsule 50 mg  50 mg Oral Q6H PRN Pennelope Bracken, MD       Or  . diphenhydrAMINE (BENADRYL) injection 50 mg  50 mg Intramuscular Q6H PRN Pennelope Bracken, MD   50 mg at 11/29/17 1020  . haloperidol (HALDOL) tablet 5 mg  5 mg Oral Q8H PRN Pennelope Bracken, MD       Or  . haloperidol lactate (HALDOL) injection 5 mg  5 mg Intramuscular Q8H PRN Pennelope Bracken, MD   5 mg at 11/28/17 0931  . hydrOXYzine (ATARAX/VISTARIL) tablet 50 mg  50 mg Oral Q6H PRN Pennelope Bracken, MD      . LORazepam (ATIVAN) tablet 2  mg  2 mg Oral Q6H PRN Pennelope Bracken, MD       Or  . LORazepam (ATIVAN) injection 2 mg  2 mg Intramuscular Q6H PRN Pennelope Bracken, MD   2 mg at 11/28/17 0930  . magnesium hydroxide (MILK OF MAGNESIA) suspension 30 mL  30 mL Oral Daily PRN Money, Lowry Ram, FNP      . [START ON 12/03/2017] paliperidone (INVEGA SUSTENNA) injection 156 mg  156 mg  Intramuscular Q30 days Lindell Spar I, NP      . paliperidone (INVEGA SUSTENNA) injection 234 mg  234 mg Intramuscular Once Lindell Spar I, NP      . potassium chloride SA (K-DUR,KLOR-CON) CR tablet 40 mEq  40 mEq Oral Once Tegeler, Gwenyth Allegra, MD      . traZODone (DESYREL) tablet 50 mg  50 mg Oral QHS PRN Money, Lowry Ram, FNP       Lab Results:  Results for orders placed or performed during the hospital encounter of 11/26/17 (from the past 48 hour(s))  CBC with Differential     Status: Abnormal   Collection Time: 11/28/17 10:45 PM  Result Value Ref Range   WBC 6.9 4.0 - 10.5 K/uL   RBC 5.70 (H) 3.87 - 5.11 MIL/uL   Hemoglobin 16.6 (H) 12.0 - 15.0 g/dL   HCT 46.9 (H) 36.0 - 46.0 %   MCV 82.3 78.0 - 100.0 fL   MCH 29.1 26.0 - 34.0 pg   MCHC 35.4 30.0 - 36.0 g/dL   RDW 12.7 11.5 - 15.5 %   Platelets 201 150 - 400 K/uL   Neutrophils Relative % 70 %   Neutro Abs 4.8 1.7 - 7.7 K/uL   Lymphocytes Relative 22 %   Lymphs Abs 1.5 0.7 - 4.0 K/uL   Monocytes Relative 7 %   Monocytes Absolute 0.5 0.1 - 1.0 K/uL   Eosinophils Relative 1 %   Eosinophils Absolute 0.1 0.0 - 0.7 K/uL   Basophils Relative 0 %   Basophils Absolute 0.0 0.0 - 0.1 K/uL    Comment: Performed at [facility], [place] [address], [place], [place] [address]  Comprehensive metabolic panel     Status: Abnormal   Collection Time: 11/28/17 10:45 PM  Result Value Ref Range   Sodium 133 (L) 135 - 145 mmol/L   Potassium 3.3 (L) 3.5 - 5.1 mmol/L   Chloride 95 (L) 101 - 111 mmol/L   CO2 22 22 - 32 mmol/L   Glucose, Bld 118 (H) 65 - 99 mg/dL   BUN 30  (H) 6 - 20 mg/dL   Creatinine, Ser 0.90 0.44 - 1.00 mg/dL   Calcium 9.6 8.9 - 10.3 mg/dL   Total Protein 8.3 (H) 6.5 - 8.1 g/dL   Albumin 4.6 3.5 - 5.0 g/dL   AST 54 (H) 15 - 41 U/L   ALT 24 14 - 54 U/L   Alkaline Phosphatase 64 38 - 126 U/L   Total Bilirubin 0.6 0.3 - 1.2 mg/dL   GFR calc non Af Amer >60 >60 mL/min   GFR calc Af Amer >60 >60 mL/min    Comment: (NOTE) The eGFR has been calculated using the CKD EPI equation. This calculation has not been validated in all clinical situations. eGFR's persistently <60 mL/min signify possible Chronic Kidney Disease.    Anion gap 16 (H) 5 - 15    Comment: Performed at [facility], [place] [address] [place], [place] [address]  CK     Status: Abnormal   Collection Time: 11/28/17 10:45 PM  Result Value Ref Range   Total CK 1,447 (H) 38 - 234 U/L    Comment: Performed at [facility], [place] [address] [place],  [address]  [place]     Status: Abnormal   Collection Time: 11/29/17  2:09 AM  Result Value Ref Range   Total CK 1,124 (H) 38 - 234 U/L    Comment: Performed at [facility]  [place] [address] [place], [place] [address]  CK     Status: Abnormal   Collection Time: 11/29/17  5:01 AM  Result Value Ref Range   Total CK 672 (H) 38 - 234 U/L    Comment: Performed at [facility], [place] [address], [place], [place] [address]   Blood Alcohol level:  Lab Results  Component Value Date   ETH <10 11/17/2017   ETH <5 91/47/8295    Metabolic Disorder Labs: Lab Results  Component Value Date   HGBA1C 6.1 (H) 04/16/2015   MPG 128 04/16/2015   Lab Results  Component Value Date   PROLACTIN 13.6 04/16/2015   Lab Results  Component Value Date   CHOL 186 04/16/2015   TRIG 64 04/16/2015   HDL 55 04/16/2015   CHOLHDL 3.4 04/16/2015   VLDL 13 04/16/2015   LDLCALC 118 (H) 04/16/2015   Physical Findings: AIMS: Facial and Oral Movements Muscles of Facial  Expression: None, normal Lips and Perioral Area: None, normal Jaw: None, normal Tongue: None, normal,Extremity Movements Upper (arms, wrists, hands, fingers): None, normal Lower (legs, knees, ankles, toes): None, normal, Trunk Movements Neck, shoulders, hips: None, normal, Overall Severity Severity of abnormal movements (highest score from questions above): None, normal Incapacitation due to abnormal movements: None, normal Patient's awareness of abnormal movements (rate only patient's report): No Awareness, Dental Status Current problems with teeth and/or dentures?: No Does patient usually wear dentures?: No  CIWA:  CIWA-Ar Total: 1 COWS:  COWS Total Score: 1  Musculoskeletal: Strength & Muscle Tone: within normal limits Gait & Station: normal Patient leans: [name]  Psychiatric Specialty Exam: Physical Exam  Nursing note and vitals reviewed.   Review of Systems  Constitutional: Negative for chills and fever.  Respiratory: Negative for cough and shortness of breath.   Cardiovascular: Negative for chest pain.  Gastrointestinal: Negative for abdominal pain, heartburn, nausea and vomiting.  Psychiatric/Behavioral: The patient is nervous/anxious.     Blood pressure 93/72, pulse 83, temperature (!) 97.4 F (36.3 C), temperature source Oral, resp. rate 16, SpO2 99 %.There is no height or weight on file to calculate BMI.  General Appearance: Casual and Disheveled  Eye Contact:  Fair  Speech:  Clear and Coherent and Normal Rate  Volume:  Normal  Mood:  Anxious, Dysphoric and Irritable  Affect:  Blunt and Labile  Thought Process:  Disorganized, Goal Directed and Descriptions of Associations: Loose  Orientation:  Full (Time, Place, and Person)  Thought Content:  Delusions, Hallucinations: internally preoccupied, Ideas of Reference:   Paranoia Delusions and Paranoid Ideation  Suicidal Thoughts:  Unable to assess  Homicidal Thoughts:  Unable to assess  Memory:  Immediate;   Fair   Judgement:  Poor  Insight:  Lacking  Psychomotor Activity:  Normal  Concentration:  Concentration: Poor  Recall:  [employer] of Knowledge:  Fair  Language:  Fair  Akathisia:  No  Handed:    AIMS (if indicated):     Assets:  Resilience  ADL's:  Intact  Cognition:  WNL  Sleep:  Number of Hours: 6.75   Treatment Plan Summary: Daily contact with patient to assess and evaluate symptoms and progress in treatment and Medication management   -Continue inpatient hospitalization  -Second opinion for forced medications completed on 11/28/17 (see note)  -Schizophrenia   -Discontinue zyprexa '5mg'$  po/IM BID   -Discontinue tegretol '100mg'$  po TID.              - Initiated  Invega Sustenna 234 mg/ml IM today.              - Initiated Lorayne Bender Sustenna 156 mg/ml IM on Friday 12-03-17, then 30 days from this date.  -Agitation   -Continue haldol '5mg'$  po/IM q8h prn agitation   -Continue benadryl '50mg'$  po/IM q6h prn agitation   -Continue Ativan '2mg'$  po/IM q6h prn agitation  -Anxiety  -Continue vistaril '50mg'$  po q6h prn anxiety  -Insomnia   -Continue trazodone '50mg'$  po qhs prn insomnia  -Encourage participation in groups and therapeutic milieu  -Disposition planning will be ongoing  Lindell Spar, NP, PMHNP, FNP-BC. 11/30/2017, 2:58 PMPatient ID: [name], female   DOB: 08-02-1960, 57 y.o.   MRN: [id number]

## 2017-11-30 NOTE — Progress Notes (Signed)
Psychoeducational Group Note  Date:  11/30/2017 Time:  2058  Group Topic/Focus:  Wrap-Up Group:   The focus of this group is to help patients review their daily goal of treatment and discuss progress on daily workbooks.  Participation Level: Did Not Attend  Participation Quality:  Not Applicable  Affect:  Not Applicable  Cognitive:  Not Applicable  Insight:  Not Applicable  Engagement in Group: Not Applicable  Additional Comments: The patient did not attend group this evening since she was asleep in her room.   Hazle Coca S 11/30/2017, 8:58 PM

## 2017-11-30 NOTE — Progress Notes (Signed)
CSW spoke to April Alexander, coordinator for community care, [phone].  Pt will not be considered for transfer until: she is eating and drinking, she is accepting medication, and has a head CT without contrast, and she is well hydrated.  Pt does not have an extablished MD currently in the [place] system.  Adaku Nelwyn Salisbury is unassigned Child psychotherapist.  Ext [phone]. Garner Nash, MSW, LCSW Clinical Social Worker 11/30/2017 2:56 PM

## 2017-11-30 NOTE — Progress Notes (Signed)
Patient ID: [name], female   DOB: 10-13-1960, 57 y.o.   MRN: [id number] PER STATE REGULATIONS 482.30  THIS CHART WAS REVIEWED FOR MEDICAL NECESSITY WITH RESPECT TO THE PATIENT'S ADMISSION/ DURATION OF STAY.  NEXT REVIEW DATE: 12/04/2017 Willa Rough, RN, BSN CASE MANAGER

## 2017-11-30 NOTE — Progress Notes (Signed)
Recreation Therapy Notes  Date: 5.14.19 Time: 0950 Location: 500 Hall Dayroom  Group Topic: Coping Skills  Goal Area(s) Addresses:  Patient will be able to identify positive coping skills. Patients will be able to identify benefits of using coping skills post d/c.  Intervention: Scissors, glue, magazines, worksheet  Activity: Counsellor.  Patients were given a worksheet that was divided into 5 categories (diversions, social, cognitive, tension releasers and physical).  Patients were to find pictures from the magazines that represent each category that could be used as coping skills.  Education:Coping Skills, Discharge Planning.   Education Outcome: Acknowledges understanding/In group clarification offered/Needs additional education.   Clinical Observations/Feedback: Pt did not participate.  Pt sat quietly and listened.    Caroll Rancher, LRT/CTRS          Lillia Abed, Jacey Eckerson A 11/30/2017 11:10 AM

## 2017-11-30 NOTE — [facility] Group Notes (Signed)
[facility] LCSW Group Therapy Note  Date/Time: 11/30/17, 1315  Type of Therapy/Topic:  Group Therapy:  Feelings about Diagnosis  Participation Level:  Minimal   Mood:irritable   Description of Group:    This group will allow patients to explore their thoughts and feelings about diagnoses they have received. Patients will be guided to explore their level of understanding and acceptance of these diagnoses. Facilitator will encourage patients to process their thoughts and feelings about the reactions of others to their diagnosis, and will guide patients in identifying ways to discuss their diagnosis with significant others in their lives. This group will be process-oriented, with patients participating in exploration of their own experiences as well as giving and receiving support and challenge from other group members.   Therapeutic Goals: 1. Patient will demonstrate understanding of diagnosis as evidence by identifying two or more symptoms of the disorder:  2. Patient will be able to express two feelings regarding the diagnosis 3. Patient will demonstrate ability to communicate their needs through discussion and/or role plays  Summary of Patient Progress: Pt continues to be irritable, states she does not have a mental health diagnosis and is here under "entrapment".  Pt did not participate in group other than to respond to several questions from CSW.        Therapeutic Modalities:   Cognitive Behavioral Therapy Brief Therapy Feelings Identification   Daleen Squibb, LCSW

## 2017-11-30 NOTE — Plan of Care (Signed)
D: Pt stays in her room, pt irritable and does not want to talk much with Clinical research associate.  A: Pt was offered support and encouragement. Pt was given scheduled medications. Pt was encourage to attend groups. Q 15 minute checks were done for safety.  R: safety maintained on unit.   Problem: Activity: Goal: Sleeping patterns will improve Outcome: Progressing   Problem: Safety: Goal: Periods of time without injury will increase Outcome: Progressing

## 2017-11-30 NOTE — Plan of Care (Signed)
Pt continues to refuse all oral medications. Pt's medication is being changed to long acting per MD.

## 2017-12-01 DIAGNOSIS — F329 Major depressive disorder, single episode, unspecified: Secondary | ICD-10-CM

## 2017-12-01 NOTE — [facility] Group Notes (Signed)
  [facility] LCSW Group Therapy Note  Date/Time: 12/01/17, 1315  Type of Therapy/Topic:  Group Therapy:  Emotion Regulation  Participation Level:  None   Mood: irritable  Description of Group:    The purpose of this group is to assist patients in learning to regulate negative emotions and experience positive emotions. Patients will be guided to discuss ways in which they have been vulnerable to their negative emotions. These vulnerabilities will be juxtaposed with experiences of positive emotions or situations, and patients challenged to use positive emotions to combat negative ones. Special emphasis will be placed on coping with negative emotions in conflict situations, and patients will process healthy conflict resolution skills.  Therapeutic Goals: 1. Patient will identify two positive emotions or experiences to reflect on in order to balance out negative emotions:  2. Patient will label two or more emotions that they find the most difficult to experience:  3. Patient will be able to demonstrate positive conflict resolution skills through discussion or role plays:   Summary of Patient Progress:Pt once again said she did not want to participate because she did not want to be at [facility].  Pt would not respond to group discussion related questions from CSW.       Therapeutic Modalities:   Cognitive Behavioral Therapy Feelings Identification Dialectical Behavioral Therapy  Daleen Squibb, LCSW

## 2017-12-01 NOTE — Progress Notes (Signed)
Recreation Therapy Notes  Date: 5.15.19 Time: 1000 Location: 500 Hall Dayroom  Group Topic: Wellness  Goal Area(s) Addresses:  Patient will define components of whole wellness. Patient will verbalize benefit of whole wellness.  Intervention: Exercise  Activity:  Exercise.  LRT discussed with patients the importance of complete wellness.  LRT then lead patients in a series of stretches to loosen them up.  Each patient was to pick an exercise for the group to complete.  The group completed 2 rounds of exercises.  Education: Wellness, Discharge Planning.   Education Outcome: Acknowledges education/In group clarification offered/Needs additional education.   Clinical Observations/Feedback: Pt did not attend group.     Garet Hooton, LRT/CTRS         Ruhani Umland A 12/01/2017 12:36 PM 

## 2017-12-01 NOTE — Progress Notes (Signed)
Did not attend group 

## 2017-12-01 NOTE — Plan of Care (Signed)
  D: Pt stayed to her room this evening, pt continues to be irritable and continues to refuse to eat A: Pt was offered support and encouragement.  Pt was encourage to attend groups. Q 15 minute checks were done for safety.  R: safety maintained on unit.  Problem: Activity: Goal: Sleeping patterns will improve Outcome: Progressing   Problem: Safety: Goal: Periods of time without injury will increase Outcome: Progressing   Problem: Coping: Goal: Coping ability will improve Outcome: Progressing

## 2017-12-01 NOTE — Progress Notes (Signed)
[facility] MD Progress Note  12/01/2017 2:00 PM [name]  MRN:  [id number] Subjective:    [name] is a 57 y/o F with history of schizophrenia who was admitted from ED on IVC with internal preoccupation, response to internal stimuli, poor oral intake of food/hydration, agitation, and medication non-adherence. Pt had similar presentation to [facility] in 2016 for disorganized, agitated behaviors, at which time she was managed with Invega. Pt has been irritable, agitated at times, and uncooperative during this admission. Second opinion for forced medications was obtained on 11/28/17. Pt had been refusing to eat or drink during her stay, and she was sent to [facility] for rehydration on 11/28/17 at which time she received 4L of fluids; CK was found to be elevated but trended down while in the ED from 1447->1124->672 after 4L of IVF. She was returned to [facility] for additional treatment and stabilzation. Pt remained irritable, agitated at times, and generally uncooperative while on the unit. She refused oral medications and was receiving zyprexa IM twice daily, until she was changed to previous medication of [place] which was administered on 11/30/17. She has continued to refuse solid food but she has been accepting liquids and has been obtaining calories from choices such as juices. Pt's family (mother and sister [guardian]) have attempted to bring pt her favorite food from home, but she has continued to decline to eat it.  Today upon evaluation, pt shares, "I'm just here." She is generally irritable and mostly uncooperative with the interview. She denies any specific concerns, but she reports that she is in disagreement with her admission and feels she should not be in the hospital. She states, "They put me here," referring to her sister. Pt was asked what happened that lead up to coming to the hospital, and she grew increasingly irritable and stated several expletives in a somewhat disorganized fashion. Pt denies  SI/HI/AH/VH. Pt was asked about her refusal to intake solid food, and she replies, "I can't eat here." Pt was asked why, and she replies, "I don't want to talk about it." Suggested to patient to try food brought in by her family, and she replied, "I didn't ask for it." Pt refused to answer any further questions, and she sates, "This is bullshit," and she abruptly left her room to avoid further questions.   Principal Problem: Schizophrenia (HCC) Diagnosis:   Patient Active Problem List   Diagnosis Date Noted  . Schizophrenia (HCC) [F20.9] 11/26/2017  . Noncompliance with diet and medication regimen [Z91.11, Z91.14]   . Psychoses (HCC) [F29]   . Hypokalemic alkalosis [E87.3] 11/11/2011  . Medically noncompliant [Z91.19] 11/11/2011  . Schizophrenia, paranoid (HCC) [F20.0] 11/07/2011   Total Time spent with patient: 30 minutes  Past Psychiatric History: See H&P  Past Medical History:  Past Medical History:  Diagnosis Date  . Non compliance w medication regimen   . Schizophrenia (HCC)    History reviewed. No pertinent surgical history. Family History: History reviewed. No pertinent family history. Family Psychiatric  History: See H&P Social History:  Social History   Substance and Sexual Activity  Alcohol Use Yes   Comment: Refuses to disclose how much     Social History   Substance and Sexual Activity  Drug Use No   Comment: Refuses to answer    Social History   Socioeconomic History  . Marital status: Single    Spouse name: Not on file  . Number of children: Not on file  . Years of education: Not on file  .  Highest education level: Not on file  Occupational History  . Not on file  Social Needs  . Financial resource strain: Not on file  . Food insecurity:    Worry: Not on file    Inability: Not on file  . Transportation needs:    Medical: Not on file    Non-medical: Not on file  Tobacco Use  . Smoking status: Current Some Day Smoker    Types: Cigarettes  .  Smokeless tobacco: Never Used  Substance and Sexual Activity  . Alcohol use: Yes    Comment: Refuses to disclose how much  . Drug use: No    Comment: Refuses to answer  . Sexual activity: Not on file    Comment: refused to answer  Lifestyle  . Physical activity:    Days per week: Not on file    Minutes per session: Not on file  . Stress: Not on file  Relationships  . Social connections:    Talks on phone: Not on file    Gets together: Not on file    Attends religious service: Not on file    Active member of club or organization: Not on file    Attends meetings of clubs or organizations: Not on file    Relationship status: Not on file  Other Topics Concern  . Not on file  Social History Narrative  . Not on file   Additional Social History:                         Sleep: Good  Appetite:  Poor  Current Medications: Current Facility-Administered Medications  Medication Dose Route Frequency Provider Last Rate Last Dose  . acetaminophen (TYLENOL) tablet 650 mg  650 mg Oral Q6H PRN Money, Gerlene Burdock, FNP      . alum & mag hydroxide-simeth (MAALOX/MYLANTA) 200-200-20 MG/5ML suspension 30 mL  30 mL Oral Q4H PRN Money, Gerlene Burdock, FNP      . carbamazepine (TEGRETOL) chewable tablet 100 mg  100 mg Oral TID Nwoko, Agnes I, NP      . diphenhydrAMINE (BENADRYL) capsule 50 mg  50 mg Oral Q6H PRN Micheal Likens, MD       Or  . diphenhydrAMINE (BENADRYL) injection 50 mg  50 mg Intramuscular Q6H PRN Micheal Likens, MD   50 mg at 11/29/17 1020  . haloperidol (HALDOL) tablet 5 mg  5 mg Oral Q8H PRN Micheal Likens, MD       Or  . haloperidol lactate (HALDOL) injection 5 mg  5 mg Intramuscular Q8H PRN Micheal Likens, MD   5 mg at 11/28/17 0931  . hydrOXYzine (ATARAX/VISTARIL) tablet 50 mg  50 mg Oral Q6H PRN Micheal Likens, MD      . LORazepam (ATIVAN) tablet 2 mg  2 mg Oral Q6H PRN Micheal Likens, MD       Or  . LORazepam  (ATIVAN) injection 2 mg  2 mg Intramuscular Q6H PRN Micheal Likens, MD   2 mg at 11/28/17 0930  . magnesium hydroxide (MILK OF MAGNESIA) suspension 30 mL  30 mL Oral Daily PRN Money, Gerlene Burdock, FNP      . [START ON 12/03/2017] paliperidone (INVEGA SUSTENNA) injection 156 mg  156 mg Intramuscular Q30 days Nwoko, Agnes I, NP      . potassium chloride SA (K-DUR,KLOR-CON) CR tablet 40 mEq  40 mEq Oral Once Tegeler, Canary Brim, MD      .  traZODone (DESYREL) tablet 50 mg  50 mg Oral QHS PRN Money, Gerlene Burdock, FNP        Lab Results: No results found for this or any previous visit (from the past 48 hour(s)).  Blood Alcohol level:  Lab Results  Component Value Date   ETH <10 11/17/2017   ETH <5 03/15/2017    Metabolic Disorder Labs: Lab Results  Component Value Date   HGBA1C 6.1 (H) 04/16/2015   MPG 128 04/16/2015   Lab Results  Component Value Date   PROLACTIN 13.6 04/16/2015   Lab Results  Component Value Date   CHOL 186 04/16/2015   TRIG 64 04/16/2015   HDL 55 04/16/2015   CHOLHDL 3.4 04/16/2015   VLDL 13 04/16/2015   LDLCALC 118 (H) 04/16/2015    Physical Findings: AIMS: Facial and Oral Movements Muscles of Facial Expression: None, normal Lips and Perioral Area: None, normal Jaw: None, normal Tongue: None, normal,Extremity Movements Upper (arms, wrists, hands, fingers): None, normal Lower (legs, knees, ankles, toes): None, normal, Trunk Movements Neck, shoulders, hips: None, normal, Overall Severity Severity of abnormal movements (highest score from questions above): None, normal Incapacitation due to abnormal movements: None, normal Patient's awareness of abnormal movements (rate only patient's report): No Awareness, Dental Status Current problems with teeth and/or dentures?: No Does patient usually wear dentures?: No  CIWA:  CIWA-Ar Total: 1 COWS:  COWS Total Score: 1  Musculoskeletal: Strength & Muscle Tone: within normal limits Gait & Station:  normal Patient leans: [name]  Psychiatric Specialty Exam: Physical Exam  Nursing note and vitals reviewed.   Review of Systems  Constitutional: Negative for chills and fever.  Respiratory: Negative for cough and shortness of breath.   Cardiovascular: Negative for chest pain.  Gastrointestinal: Negative for abdominal pain, heartburn, nausea and vomiting.  Psychiatric/Behavioral: Positive for depression. Negative for hallucinations and suicidal ideas. The patient is nervous/anxious. The patient does not have insomnia.     Blood pressure 90/68, pulse 98, temperature 98.4 F (36.9 C), temperature source Oral, resp. rate 16, SpO2 99 %.There is no height or weight on file to calculate BMI.  General Appearance: Disheveled  Eye Contact:  Poor  Speech:  Clear and Coherent and Normal Rate  Volume:  Normal  Mood:  Dysphoric and Irritable  Affect:  Blunt  Thought Process:  Coherent and Goal Directed  Orientation:  Full (Time, Place, and Person)  Thought Content:  Paranoid Ideation  Suicidal Thoughts:  No  Homicidal Thoughts:  No  Memory:  Immediate;   Fair Recent;   Fair Remote;   Fair  Judgement:  Poor  Insight:  Lacking  Psychomotor Activity:  Normal  Concentration:  Concentration: Fair  Recall:  [employer] of Knowledge:  Fair  Language:  Fair  Akathisia:  No  Handed:    AIMS (if indicated):     Assets:  Social Support  ADL's:  Intact  Cognition:  WNL  Sleep:  Number of Hours: 6.75   Treatment Plan Summary: Daily contact with patient to assess and evaluate symptoms and progress in treatment and Medication management   -Continue inpatient hospitalization  -Second opinion for forced medications completed on 11/28/17 (see note)  -Schizophrenia              - Continue Initiated Gean Birchwood. Anticipate starting Invega Sustenna 156 mg IM q30 on Friday 12-03-17 Hinda Glatter Sustenna  IM administered 11/30/17)  -Agitation              -Continue haldol   po/IM q8h prn  agitation              -Continue benadryl  po/IM q6h prn agitation             -Continue Ativan  po/IM q6h prn agitation  -Anxiety             -Continue vistaril  po q6h prn anxiety  -Insomnia              -Continue trazodone  po qhs prn insomnia  -Encourage participation in groups and therapeutic milieu  -Disposition planning will be ongoing  Micheal Likens, MD 12/01/2017, 2:00 PM

## 2017-12-01 NOTE — Progress Notes (Signed)
Patient has been isolative to room for the majority of the shift.  Patient denies SI, HI and AVH and reports a reduction in psychotic symptoms.  Patient has had no issues of behavioral dsycontrol.  Assess patient for safety, offer medications as prescribed, engage in 1:1 staff talks. Patient able to contract for safety.  Continue to monitor as planned.   

## 2017-12-02 NOTE — Progress Notes (Signed)
Did not attend group 

## 2017-12-02 NOTE — Plan of Care (Signed)
D: Pt denies SI. Patient is irritable and in bed. Patient in room most of shift. Patient not interacting with peers or staff.  A: Pt was offered support and encouragement. Pt  encourage to attend groups. Q 15 minute checks were done for safety.  R:Pt did not attend group  Pt is taking medication. Pt has no complaints. Patient remains safe on unit.   Problem: Safety: Goal: Periods of time without injury will increase Outcome: Progressing Note:  Patient is free of injury and remains safe on unit

## 2017-12-02 NOTE — [facility] Group Notes (Signed)
LCSW Group Therapy Note   12/02/2017 1:15pm   Type of Therapy and Topic:  Group Therapy:  Positive Affirmations   Participation Level:  Minimal  Description of Group: This group addressed positive affirmation toward self and others. Patients went around the room and identified two positive things about themselves and two positive things about a peer in the room. Patients reflected on how it felt to share something positive with others, to identify positive things about themselves, and to hear positive things from others. Patients were encouraged to have a daily reflection of positive characteristics or circumstances.  Therapeutic Goals 1. Patient will verbalize two of their positive qualities 2. Patient will demonstrate empathy for others by stating two positive qualities about a peer in the group 3. Patient will verbalize their feelings when voicing positive self affirmations and when voicing positive affirmations of others 4. Patients will discuss the potential positive impact on their wellness/recovery of focusing on positive traits of self and others. Summary of Patient Progress: Stayed the entire time, staring down at the floor or straight ahead most of the time.  "I take it one day at a time.  That's just how I have always done it."  Attempted to recite the [place]ren[place] prayer, and another patient helped her out.   Therapeutic Modalities Cognitive Behavioral Therapy Motivational Interviewing  [name] Rogue, [place] 12/02/2017 3:08 PM

## 2017-12-02 NOTE — Progress Notes (Signed)
Patient has been isolative to room for the majority of the shift.  Patient denies SI, HI and AVH and reports a reduction in psychotic symptoms.  Patient has had no issues of behavioral dsycontrol.  Assess patient for safety, offer medications as prescribed, engage in 1:1 staff talks. Patient able to contract for safety.  Continue to monitor as planned.   

## 2017-12-02 NOTE — Progress Notes (Signed)
[facility] MD Progress Note  12/02/2017 4:11 PM [name]  MRN:  [id number]  Subjective: Sage reports, "I'm alright. My mood is fine. I slept well last night. They gave me injections on my left arm 2 days ago & I haven't bothered no nurse since then. I don't want to eat any food here. I went to breakfast, I ate just a little bit. Why is everybody keep asking me about my favorite food. I don't have no favorite food. I don't care about no favorite food. I don't drink ensure. That stuff is processed. No one is gonna get me to eat any food here. I got no depression. No, I'm not hearing no voices. I just want to get out of here".   [name] is a 57 y/o F with history of schizophrenia who was admitted from ED on IVC with internal preoccupation, response to internal stimuli, poor oral intake of food/hydration, agitation, and medication non-adherence. Pt had similar presentation to [facility] in 2016 for disorganized, agitated behaviors, at which time she was managed with Invega. Pt has been irritable, agitated at times, and uncooperative during this admission. Second opinion for forced medications was obtained on 11/28/17. Pt had been refusing to eat or drink during her stay, and she was sent to [facility] for rehydration on 11/28/17 at which time she received 4L of fluids; CK was found to be elevated but trended down while in the ED from 1447->1124->672 after 4L of IVF. She was returned to [facility] for additional treatment and stabilzation. Pt remained irritable, agitated at times, and generally uncooperative while on the unit. She refused oral medications and was receiving zyprexa IM twice daily, until she was changed to previous medication of [place] which was administered on 11/30/17. She has continued to refuse solid food but she has been accepting liquids and has been obtaining calories from choices such as juices. Pt's family (mother and sister [guardian]) have attempted to bring pt her favorite food from home, but she has  continued to decline to eat it.  Today 12-02-17,  upon evaluation, She is generally irritable & blunt during the interview, but polite & approachable today. She has made it clear that she is not going to eat any food here. She says she does not have any favorite food & does not care about the food here. She is taking her medications as long as it is by injections. She denies feeling or being depressed. She denies SI/HI/AH/VH. Pt was asked by the attending psychiatrist during follow-up care interview yesterday about her refusal to intake solid food, and she replied, "I can't eat here." Pt was asked why? She replied, "I don't want to talk about it." She was suggested to try food brought in by her family and she replied, "I didn't ask for it." Although presents psychotic, she does not appear to be in no apparent distress. Staff continues to encourage & support patient.  Principal Problem: Schizophrenia (HCC) Diagnosis:   Patient Active Problem List   Diagnosis Date Noted  . Schizophrenia (HCC) [F20.9] 11/26/2017    Priority: High  . Noncompliance with diet and medication regimen [Z91.11, Z91.14]   . Psychoses (HCC) [F29]   . Hypokalemic alkalosis [E87.3] 11/11/2011  . Medically noncompliant [Z91.19] 11/11/2011  . Schizophrenia, paranoid (HCC) [F20.0] 11/07/2011   Total Time spent with patient: 30 minutes  Past Psychiatric History: See H&P  Past Medical History:  Past Medical History:  Diagnosis Date  . Non compliance w medication regimen   .  Schizophrenia (HCC)    History reviewed. No pertinent surgical history. Family History: History reviewed. No pertinent family history. Family Psychiatric  History: See H&P Social History:  Social History   Substance and Sexual Activity  Alcohol Use Yes   Comment: Refuses to disclose how much     Social History   Substance and Sexual Activity  Drug Use No   Comment: Refuses to answer    Social History   Socioeconomic History  . Marital  status: Single    Spouse name: Not on file  . Number of children: Not on file  . Years of education: Not on file  . Highest education level: Not on file  Occupational History  . Not on file  Social Needs  . Financial resource strain: Not on file  . Food insecurity:    Worry: Not on file    Inability: Not on file  . Transportation needs:    Medical: Not on file    Non-medical: Not on file  Tobacco Use  . Smoking status: Current Some Day Smoker    Types: Cigarettes  . Smokeless tobacco: Never Used  Substance and Sexual Activity  . Alcohol use: Yes    Comment: Refuses to disclose how much  . Drug use: No    Comment: Refuses to answer  . Sexual activity: Not on file    Comment: refused to answer  Lifestyle  . Physical activity:    Days per week: Not on file    Minutes per session: Not on file  . Stress: Not on file  Relationships  . Social connections:    Talks on phone: Not on file    Gets together: Not on file    Attends religious service: Not on file    Active member of club or organization: Not on file    Attends meetings of clubs or organizations: Not on file    Relationship status: Not on file  Other Topics Concern  . Not on file  Social History Narrative  . Not on file   Additional Social History:   Sleep: Good  Appetite:  Poor  Current Medications: Current Facility-Administered Medications  Medication Dose Route Frequency Provider Last Rate Last Dose  . acetaminophen (TYLENOL) tablet 650 mg  650 mg Oral Q6H PRN Money, Gerlene Burdock, FNP      . alum & mag hydroxide-simeth (MAALOX/MYLANTA) 200-200-20 MG/5ML suspension 30 mL  30 mL Oral Q4H PRN Money, Feliz Beam B, FNP      . diphenhydrAMINE (BENADRYL) capsule 50 mg  50 mg Oral Q6H PRN Micheal Likens, MD       Or  . diphenhydrAMINE (BENADRYL) injection 50 mg  50 mg Intramuscular Q6H PRN Micheal Likens, MD   50 mg at 11/29/17 1020  . haloperidol (HALDOL) tablet 5 mg  5 mg Oral Q8H PRN Micheal Likens, MD       Or  . haloperidol lactate (HALDOL) injection 5 mg  5 mg Intramuscular Q8H PRN Micheal Likens, MD   5 mg at 11/28/17 0931  . hydrOXYzine (ATARAX/VISTARIL) tablet 50 mg  50 mg Oral Q6H PRN Micheal Likens, MD      . LORazepam (ATIVAN) tablet 2 mg  2 mg Oral Q6H PRN Micheal Likens, MD       Or  . LORazepam (ATIVAN) injection 2 mg  2 mg Intramuscular Q6H PRN Micheal Likens, MD   2 mg at 11/28/17 0930  . magnesium hydroxide (MILK OF  MAGNESIA) suspension 30 mL  30 mL Oral Daily PRN Money, Gerlene Burdock, FNP      . [START ON 12/03/2017] paliperidone (INVEGA SUSTENNA) injection 156 mg  156 mg Intramuscular Q30 days Ura Hausen I, NP      . potassium chloride SA (K-DUR,KLOR-CON) CR tablet 40 mEq  40 mEq Oral Once Tegeler, Canary Brim, MD      . traZODone (DESYREL) tablet 50 mg  50 mg Oral QHS PRN Money, Gerlene Burdock, FNP        Lab Results: No results found for this or any previous visit (from the past 48 hour(s)).  Blood Alcohol level:  Lab Results  Component Value Date   ETH <10 11/17/2017   ETH <5 03/15/2017   Metabolic Disorder Labs: Lab Results  Component Value Date   HGBA1C 6.1 (H) 04/16/2015   MPG 128 04/16/2015   Lab Results  Component Value Date   PROLACTIN 13.6 04/16/2015   Lab Results  Component Value Date   CHOL 186 04/16/2015   TRIG 64 04/16/2015   HDL 55 04/16/2015   CHOLHDL 3.4 04/16/2015   VLDL 13 04/16/2015   LDLCALC 118 (H) 04/16/2015   Physical Findings: AIMS: Facial and Oral Movements Muscles of Facial Expression: None, normal Lips and Perioral Area: None, normal Jaw: None, normal Tongue: None, normal,Extremity Movements Upper (arms, wrists, hands, fingers): None, normal Lower (legs, knees, ankles, toes): None, normal, Trunk Movements Neck, shoulders, hips: None, normal, Overall Severity Severity of abnormal movements (highest score from questions above): None, normal Incapacitation due to abnormal  movements: None, normal Patient's awareness of abnormal movements (rate only patient's report): No Awareness, Dental Status Current problems with teeth and/or dentures?: No Does patient usually wear dentures?: No  CIWA:  CIWA-Ar Total: 1 COWS:  COWS Total Score: 1  Musculoskeletal: Strength & Muscle Tone: within normal limits Gait & Station: normal Patient leans: [name]  Psychiatric Specialty Exam: Physical Exam  Nursing note and vitals reviewed.   Review of Systems  Constitutional: Negative for chills and fever.  Respiratory: Negative for cough and shortness of breath.   Cardiovascular: Negative for chest pain.  Gastrointestinal: Negative for abdominal pain, heartburn, nausea and vomiting.  Psychiatric/Behavioral: Positive for depression. Negative for hallucinations and suicidal ideas. The patient is nervous/anxious. The patient does not have insomnia.     Blood pressure (!) 61/50, pulse 85, temperature 98.6 F (37 C), temperature source Oral, resp. rate 18, SpO2 99 %.There is no height or weight on file to calculate BMI.  General Appearance: Disheveled  Eye Contact:  Poor  Speech:  Clear and Coherent and Normal Rate  Volume:  Normal  Mood:  Dysphoric and Irritable  Affect:  Blunt  Thought Process:  Coherent and Goal Directed  Orientation:  Full (Time, Place, and Person)  Thought Content:  Paranoid Ideation  Suicidal Thoughts:  No  Homicidal Thoughts:  No  Memory:  Immediate;   Fair Recent;   Fair Remote;   Fair  Judgement:  Poor  Insight:  Lacking  Psychomotor Activity:  Normal  Concentration:  Concentration: Fair  Recall:  [employer] of Knowledge:  Fair  Language:  Fair  Akathisia:  No  Handed:    AIMS (if indicated):     Assets:  Social Support  ADL's:  Intact  Cognition:  WNL  Sleep:  Number of Hours: 6   Treatment Plan Summary: Daily contact with patient to assess and evaluate symptoms and progress in treatment and Medication management   -Continue  inpatient hospitalization  -Will continue today 12/02/2017 plan as below except where it is noted.  -Second opinion for forced medications completed on 11/28/17 (see note)  -Schizophrenia              - Continue Initiated Gean Birchwood. Anticipate starting Invega Sustenna 156 mg IM q30 on Friday 12-03-17 Hinda Glatter Sustenna 234mg  IM administered 11/30/17)  -Agitation              -Continue haldol 5mg  po/IM q8h prn agitation              -Continue benadryl 50mg  po/IM q6h prn agitation             -Continue Ativan 2mg  po/IM q6h prn agitation  -Anxiety             -Continue vistaril 50mg  po q6h prn anxiety  -Insomnia              -Continue trazodone 50mg  po qhs prn insomnia  -Encourage participation in groups and therapeutic milieu  -Disposition planning will be ongoing  Armandina Stammer, NP, pmhnp, FNP-BC 12/02/2017, 4:11 PMPatient ID: [name], female   DOB: 1960-10-30, 57 y.o.   MRN: [id number]

## 2017-12-02 NOTE — Progress Notes (Addendum)
Recreation Therapy Notes  Date: 5.16.19 Time: 1000 Location: 500 Hall Dayroom  Group Topic: Self-Expression  Goal Area(s) Addresses:  Patient will successfully identify positive attributes about themselves.  Patient will successfully identify benefit of improved self-expression.   Intervention: Markers, colored pencils, construction paper  Activity: Personalized License Plate.  Patients were to design [name] license plate that described them, what they like and important dates.  Education:  Self-Esteem, Building control surveyor.   Education Outcome: Acknowledges education/In group clarification offered/Needs additional education  Clinical Observations/Feedback: Pt did not participate in group.  Pt sat with her down and rocked in her chair.    Caroll Rancher, LRT/CTRS         [name], [name] 12/02/2017 1:30 PM

## 2017-12-03 MED ORDER — PALIPERIDONE PALMITATE ER 156 MG/ML IM SUSY
156.0000 mg | PREFILLED_SYRINGE | INTRAMUSCULAR | Status: DC
  Administered 2017-12-04: 156 mg via INTRAMUSCULAR
  Filled 2017-12-03: qty 1

## 2017-12-03 NOTE — Progress Notes (Signed)
[facility] MD Progress Note  12/03/2017 11:53 AM [name]  MRN:  [id number] Subjective:    [name] is a 57 y/o F with history of schizophrenia who was admitted from ED on IVC with internal preoccupation, response to internal stimuli, poor oral intake of food/hydration, agitation, and medication non-adherence. Pt had similar presentation to [facility] in 2016 for disorganized, agitated behaviors, at which time she was managed with Invega. Pt has been irritable, agitated at times, and uncooperative during this admission. Second opinion for forced medications was obtained on 11/28/17. Pt had been refusing to eat or drink during her stay, and she was sent to [facility] for rehydration on 11/28/17 at which time she received 4L of fluids; CK was found to be elevated but trended down while in the ED from 1447->1124->672 after 4L of IVF. She was returned to [facility] for additional treatment and stabilzation. Pt remained irritable, agitated at times, and generally uncooperative while on the unit. She refused oral medications and was receiving zyprexa IM twice daily, until she was changed to previous medication of [place] which was administered on 11/30/17. She has continued to refuse solid food but she has been accepting liquids and has been obtaining calories from choices such as juices. Pt's family (mother and sister [guardian]) have attempted to bring pt her favorite food from home, but she has continued to decline to eat it. Pt has demonstrated slight, incremental improvement to her agitation, but she continues to refuse solid foods during her stay.  Today upon evaluation, pt shares, "I'm just here." She is easily irritated during interview, and gets up several times during interview and leaves the room, but she returns after a brief pause. She denies any physical complaints. She denies SI/HI/AH/VH. Pt was asked about her refusal to eat solid foods, and she shares, "I just can't take it. It's not digestible by my stomach."  Pt was asked if she believes the food is contaminated, and she replies, "I'm not going to get into it." Pt was asked if she would eat food brought from outside the hospital, and she replies, "I'm not going to eat anything from my so-called family." Pt continues to repeats, "There's nothing you can do to help me except let me go home." Discussed with patient that she will need to be eating solid food before we would consider it safe for her to return home, and pt replied, "I'll just wait to talk to my public defender." Discussed with patient that is is unlikely that her judge will deem it is safe for her to return home when she is refusing all forms of solid food, and pt grew increasingly irritable and walked away from the interview. She did return later, and she did agree to continue taking fluids and calories via fluids such as juices. She had no further questions, comments, or concerns.  Principal Problem: Schizophrenia (HCC) Diagnosis:   Patient Active Problem List   Diagnosis Date Noted  . Schizophrenia (HCC) [F20.9] 11/26/2017  . Noncompliance with diet and medication regimen [Z91.11, Z91.14]   . Psychoses (HCC) [F29]   . Hypokalemic alkalosis [E87.3] 11/11/2011  . Medically noncompliant [Z91.19] 11/11/2011  . Schizophrenia, paranoid (HCC) [F20.0] 11/07/2011   Total Time spent with patient: 30 minutes  Past Psychiatric History: see H&P  Past Medical History:  Past Medical History:  Diagnosis Date  . Non compliance w medication regimen   . Schizophrenia (HCC)    History reviewed. No pertinent surgical history. Family History: History reviewed. No pertinent  family history. Family Psychiatric  History: see H&P Social History:  Social History   Substance and Sexual Activity  Alcohol Use Yes   Comment: Refuses to disclose how much     Social History   Substance and Sexual Activity  Drug Use No   Comment: Refuses to answer    Social History   Socioeconomic History  . Marital  status: Single    Spouse name: Not on file  . Number of children: Not on file  . Years of education: Not on file  . Highest education level: Not on file  Occupational History  . Not on file  Social Needs  . Financial resource strain: Not on file  . Food insecurity:    Worry: Not on file    Inability: Not on file  . Transportation needs:    Medical: Not on file    Non-medical: Not on file  Tobacco Use  . Smoking status: Current Some Day Smoker    Types: Cigarettes  . Smokeless tobacco: Never Used  Substance and Sexual Activity  . Alcohol use: Yes    Comment: Refuses to disclose how much  . Drug use: No    Comment: Refuses to answer  . Sexual activity: Not on file    Comment: refused to answer  Lifestyle  . Physical activity:    Days per week: Not on file    Minutes per session: Not on file  . Stress: Not on file  Relationships  . Social connections:    Talks on phone: Not on file    Gets together: Not on file    Attends religious service: Not on file    Active member of club or organization: Not on file    Attends meetings of clubs or organizations: Not on file    Relationship status: Not on file  Other Topics Concern  . Not on file  Social History Narrative  . Not on file   Additional Social History:                         Sleep: Good  Appetite:  Poor  Current Medications: Current Facility-Administered Medications  Medication Dose Route Frequency Provider Last Rate Last Dose  . acetaminophen (TYLENOL) tablet 650 mg  650 mg Oral Q6H PRN Money, Gerlene Burdock, FNP      . alum & mag hydroxide-simeth (MAALOX/MYLANTA) 200-200-20 MG/5ML suspension 30 mL  30 mL Oral Q4H PRN Money, Feliz Beam B, FNP      . diphenhydrAMINE (BENADRYL) capsule 50 mg  50 mg Oral Q6H PRN Micheal Likens, MD       Or  . diphenhydrAMINE (BENADRYL) injection 50 mg  50 mg Intramuscular Q6H PRN Micheal Likens, MD   50 mg at 11/29/17 1020  . haloperidol (HALDOL) tablet 5 mg   5 mg Oral Q8H PRN Micheal Likens, MD       Or  . haloperidol lactate (HALDOL) injection 5 mg  5 mg Intramuscular Q8H PRN Micheal Likens, MD   5 mg at 11/28/17 0931  . hydrOXYzine (ATARAX/VISTARIL) tablet 50 mg  50 mg Oral Q6H PRN Micheal Likens, MD      . LORazepam (ATIVAN) tablet 2 mg  2 mg Oral Q6H PRN Micheal Likens, MD       Or  . LORazepam (ATIVAN) injection 2 mg  2 mg Intramuscular Q6H PRN Micheal Likens, MD   2 mg at 11/28/17 0930  .  magnesium hydroxide (MILK OF MAGNESIA) suspension 30 mL  30 mL Oral Daily PRN Money, Feliz Beam B, FNP      . paliperidone (INVEGA SUSTENNA) injection 156 mg  156 mg Intramuscular Q30 days Nwoko, Agnes I, NP      . potassium chloride SA (K-DUR,KLOR-CON) CR tablet 40 mEq  40 mEq Oral Once Tegeler, Canary Brim, MD      . traZODone (DESYREL) tablet 50 mg  50 mg Oral QHS PRN Money, Gerlene Burdock, FNP        Lab Results: No results found for this or any previous visit (from the past 48 hour(s)).  Blood Alcohol level:  Lab Results  Component Value Date   ETH <10 11/17/2017   ETH <5 03/15/2017    Metabolic Disorder Labs: Lab Results  Component Value Date   HGBA1C 6.1 (H) 04/16/2015   MPG 128 04/16/2015   Lab Results  Component Value Date   PROLACTIN 13.6 04/16/2015   Lab Results  Component Value Date   CHOL 186 04/16/2015   TRIG 64 04/16/2015   HDL 55 04/16/2015   CHOLHDL 3.4 04/16/2015   VLDL 13 04/16/2015   LDLCALC 118 (H) 04/16/2015    Physical Findings: AIMS: Facial and Oral Movements Muscles of Facial Expression: None, normal Lips and Perioral Area: None, normal Jaw: None, normal Tongue: None, normal,Extremity Movements Upper (arms, wrists, hands, fingers): None, normal Lower (legs, knees, ankles, toes): None, normal, Trunk Movements Neck, shoulders, hips: None, normal, Overall Severity Severity of abnormal movements (highest score from questions above): None, normal Incapacitation due  to abnormal movements: None, normal Patient's awareness of abnormal movements (rate only patient's report): No Awareness, Dental Status Current problems with teeth and/or dentures?: No Does patient usually wear dentures?: No  CIWA:  CIWA-Ar Total: 1 COWS:  COWS Total Score: 1  Musculoskeletal: Strength & Muscle Tone: within normal limits Gait & Station: normal Patient leans: [name]  Psychiatric Specialty Exam: Physical Exam  Nursing note and vitals reviewed.   Review of Systems  Constitutional: Negative for chills and fever.  Respiratory: Negative for cough and shortness of breath.   Cardiovascular: Negative for chest pain.  Gastrointestinal: Negative for abdominal pain, heartburn, nausea and vomiting.  Psychiatric/Behavioral: Positive for depression. Negative for hallucinations and suicidal ideas. The patient is nervous/anxious. The patient does not have insomnia.     Blood pressure (!) 87/60, pulse (!) 59, temperature 99.3 F (37.4 C), temperature source Oral, resp. rate 18, SpO2 97 %.There is no height or weight on file to calculate BMI.  General Appearance: Casual, Disheveled and malodorous  Eye Contact:  Poor  Speech:  Clear and Coherent  Volume:  Increased  Mood:  Angry, Dysphoric and Irritable  Affect:  Blunt  Thought Process:  Coherent, Goal Directed and Descriptions of Associations: Loose  Orientation:  Full (Time, Place, and Person)  Thought Content:  Delusions, Ideas of Reference:   Paranoia Delusions and Paranoid Ideation  Suicidal Thoughts:  No  Homicidal Thoughts:  No  Memory:  Immediate;   Fair Recent;   Fair Remote;   Fair  Judgement:  Poor  Insight:  Lacking  Psychomotor Activity:  Normal  Concentration:  Concentration: Fair  Recall:  [employer] of Knowledge:  Fair  Language:  Fair  Akathisia:  No  Handed:    AIMS (if indicated):     Assets:  Resilience Social Support  ADL's:  Intact  Cognition:  WNL  Sleep:  Number of Hours: 6.5   Treatment Plan  Summary: Daily contact with patient to assess and evaluate symptoms and progress in treatment and Medication management   -Continue inpatient hospitalization  -Second opinion for forced medications completed on 11/28/17 (see note)  -Schizophrenia  - Continue Initiated Gean Birchwood. Start Invega Sustenna 156 mg IM q30 on  Today Friday 12-03-17 Hinda Glatter Lorelei Pont  IM administered 11/30/17)  -Agitation -Continue haldol  po/IM q8h prn agitation -Continue benadryl  po/IM q6h prn agitation -Continue Ativan  po/IM q6h prn agitation  -Anxiety -Continue vistaril  po q6h prn anxiety  -Insomnia -Continue trazodone  po qhs prn insomnia  -Encourage participation in groups and therapeutic milieu  -Disposition planning will be ongoing  Micheal Likens, MD 12/03/2017, 11:53 AM

## 2017-12-03 NOTE — Progress Notes (Signed)
Patient has been isolative to room for the majority of the shift.  Patient denies SI, HI and AVH and reports a reduction in psychotic symptoms.  Patient has had no issues of behavioral dsycontrol.  Assess patient for safety, offer medications as prescribed, engage in 1:1 staff talks. Patient able to contract for safety.  Continue to monitor as planned.   

## 2017-12-03 NOTE — Progress Notes (Signed)
Did not attend group 

## 2017-12-03 NOTE — Plan of Care (Signed)
D: Pt denies SI/HI/. Pt is pleasant and cooperative. Pt isolated toi room, pt endorsed reduction AVH, pt stated she's ready to leave  A: Pt was offered support and encouragement. Pt was encourage to attend groups. Q 15 minute checks were done for safety.  R: safety maintained on unit.   Problem: Activity: Goal: Sleeping patterns will improve Outcome: Progressing   Problem: Coping: Goal: Ability to verbalize frustrations and anger appropriately will improve Outcome: Progressing   Problem: Coping: Goal: Ability to demonstrate self-control will improve Outcome: Progressing   Problem: Activity: Goal: Interest or engagement in leisure activities will improve Outcome: Not Progressing   Problem: Coping: Goal: Coping ability will improve Outcome: Not Progressing

## 2017-12-03 NOTE — [facility] Group Notes (Addendum)
[facility] LCSW Group Therapy  12/03/2017  1:15 PM  Type of Therapy:  Group therapy  Participation Level:  limited  Participation Quality:  Attentive  Affect:  congruent  Cognitive:  Oriented  Insight:  Limited  Engagement in Therapy:  Limited  Modes of Intervention:  Discussion, Socialization  Summary of Progress/Problems:  Chaplain was here to lead a group on themes of hope and courage. Pt did not speak up during group discussion but did briefly respond to chaplain questions.  Daleen Squibb, LCSW

## 2017-12-03 NOTE — Progress Notes (Signed)
Recreation Therapy Notes  Date: 5.17.19 Time: 1000 Location: 500 Hall Dayroom   Group Topic: Communication, Team Building, Problem Solving  Goal Area(s) Addresses:  Patient will effectively work with peer towards shared goal.  Patient will identify skill used to make activity successful.  Patient will identify how skills used during activity can be used to reach post d/c goals.   Behavioral Response: None  Intervention: STEM Activity   Activity: [employer]. Patients were provided the following materials: 5 drinking straws, 5 rubber bands, 5 paper clips, 2 index cards, 2 drinking cups, and 2 toilet paper rolls. Using the provided materials patients were asked to build a launching mechanisms to launch a ping pong ball approximately 12 feet. Patients were divided into teams of 3-5.   Education: Pharmacist, community, Building control surveyor.   Education Outcome: Acknowledges education/In group clarification offered/Needs additional education.   Clinical Observations/Feedback: Pt stat and observed.     Caroll Rancher, LRT/CTRS        Lillia Abed, Sheena Simonis A 12/03/2017 11:19 AM

## 2017-12-04 DIAGNOSIS — Z79899 Other long term (current) drug therapy: Secondary | ICD-10-CM

## 2017-12-04 NOTE — [facility] Group Notes (Signed)
  [facility] LCSW Group Therapy Note  Date/Time:  12/04/2017 11:30AM-12:00PM  Type of Therapy and Topic:  Group Therapy:  Feelings About Hospitalization  Participation Level:  Minimal   Description of Group This process group involved patients discussing their feelings related to being hospitalized, as well as the benefits they see to being in the hospital.  These feelings and benefits were itemized.  The group then brainstormed specific ways in which they could seek those same benefits when they discharge and return home.  Therapeutic Goals 1. Patient will identify and describe positive and negative feelings related to hospitalization 2. Patient will verbalize benefits of hospitalization to themselves personally 3. Patients will brainstorm together ways they can obtain similar benefits in the outpatient setting, identify barriers to wellness and possible solutions  Summary of Patient Progress:  The patient expressed her primary feelings about being hospitalized are "I don't want to be here, I'm here because of a grudge and I don't want to talk any further."  Therapeutic Modalities Cognitive Behavioral Therapy Motivational Interviewing    Ambrose Mantle, LCSW 12/04/2017, 12:16 PM

## 2017-12-04 NOTE — Progress Notes (Signed)
Patient ID: [name], female   DOB: 04/27/1961, 57 y.o.   MRN: [id number] Per State regulations 482.30 this chart was reviewed for medical necessity with respect to the patient's admission/duration of stay.    Next review date: 12/08/17  Thurman Coyer, BSN, RN-BC  Case Manager

## 2017-12-04 NOTE — [facility] Group Notes (Signed)
[facility] Group Notes:  (Nursing/MHT/Case Management/Adjunct)  Date:  12/04/2017  Time:  1:04 PM  Type of Therapy:  Psychoeducational Skills  Participation Level:  Minimal  Participation Quality:  Resistant  Affect:  Resistant  Cognitive:  Disorganized  Insight:  Lacking  Engagement in Group:  Lacking  Modes of Intervention:  Problem-solving  Summary of Progress/Problems: Pt attended Psychoeducational group with topic anger management.   Jacquelyne Balint Shanta 12/04/2017, 1:04 PM

## 2017-12-04 NOTE — Progress Notes (Signed)
Did not attend group 

## 2017-12-04 NOTE — [facility] Group Notes (Signed)
[facility] Group Notes:  (Nursing/MHT/Case Management/Adjunct)  Date:  12/04/2017  Time:  1:04 PM  Type of Therapy:  Patient self inventory group and goals group  Participation Level:  Active  Participation Quality:  Inattentive  Affect:  Anxious and Resistant  Cognitive:  Disorganized  Insight:  Lacking  Engagement in Group:  Lacking  Modes of Intervention:  Education  Summary of Progress/Problems: Pt did attend patient self inventory group and goals group.  Jacquelyne Balint Shanta 12/04/2017, 1:04 PM

## 2017-12-04 NOTE — Progress Notes (Signed)
D. Pt presents with a sad affect and behavior- isolating to room, sitting on side of bed, for most of the shift-  pt observed sitting in dayroom during group this am and attending lunch with peers. Pt provided fresh pitcher of water and encouraged to drink. Per pt's self inventory, pt rates her depression, hopelessness and anxiety all 0's. Pt currently denies SI/HI and AV hallucinations  A. Labs and vitals monitored. Pt compliant with medications. Pt supported emotionally and encouraged to express concerns and ask questions.   R. Pt remains safe with 15 minute checks. Will continue POC.

## 2017-12-04 NOTE — Progress Notes (Signed)
[facility] MD Progress Note  12/04/2017 1:38 PM [name]  MRN:  [id number]    Subjective: [name] seen resting in her bedroom sitting on the side of the bed.  Presents flat, depressed and guarded.  Reports "  I am alright" continues to refuse medications.  Reports injections I do not care.  Patient seen responding to internal stimuli.  No reported disruptive behaviors on the unit.  Staff reports patient continues to refuse medications and food by mouth.  Presents with irritability  and agitated during assessment.  Denies depression.  Denies homicidal or suicidal ideations.  Denies auditory visual hallucinations.  Staff to continue to monitor and encourage hydration and  food intake.  Denies resistance with medications IM.  Support and encouragement and reassurance was provided  History: [name] is a 57 y/o F with history of schizophrenia who was admitted from ED on IVC with internal preoccupation, response to internal stimuli, poor oral intake of food/hydration, agitation, and medication non-adherence. Pt had similar presentation to [facility] in 2016 for disorganized, agitated behaviors, at which time she was managed with Invega. Pt has been irritable, agitated at times, and uncooperative during this admission. Second opinion for forced medications was obtained on 11/28/17. Pt had been refusing to eat or drink during her stay, and she was sent to [facility]ED for rehydration on 11/28/17 at which time she received 4L of fluids; CK was found to be elevated but trended down while in the ED from 1447->1124->672 after 4L of IVF. She was returned to [facility] for additional treatment and stabilzation. Pt remained irritable, agitated at times, and generally uncooperative while on the unit. She refused oral medications and was receiving zyprexa IM twice daily, until she was changed to previous medication of [place] which was administered on 11/30/17. She has continued to refuse solid food but she has been accepting liquids and has  been obtaining calories from choices such as juices. Pt's family (mother and sister [guardian]) have attempted to bring pt her favorite food from home, but she has continued to decline to eat it.    Principal Problem: Schizophrenia (HCC) Diagnosis:   Patient Active Problem List   Diagnosis Date Noted  . Schizophrenia (HCC) [F20.9] 11/26/2017  . Noncompliance with diet and medication regimen [Z91.11, Z91.14]   . Psychoses (HCC) [F29]   . Hypokalemic alkalosis [E87.3] 11/11/2011  . Medically noncompliant [Z91.19] 11/11/2011  . Schizophrenia, paranoid (HCC) [F20.0] 11/07/2011   Total Time spent with patient: 30 minutes  Past Psychiatric History: See H&P  Past Medical History:  Past Medical History:  Diagnosis Date  . Non compliance w medication regimen   . Schizophrenia (HCC)    History reviewed. No pertinent surgical history. Family History: History reviewed. No pertinent family history. Family Psychiatric  History: See H&P Social History:  Social History   Substance and Sexual Activity  Alcohol Use Yes   Comment: Refuses to disclose how much     Social History   Substance and Sexual Activity  Drug Use No   Comment: Refuses to answer    Social History   Socioeconomic History  . Marital status: Single    Spouse name: Not on file  . Number of children: Not on file  . Years of education: Not on file  . Highest education level: Not on file  Occupational History  . Not on file  Social Needs  . Financial resource strain: Not on file  . Food insecurity:    Worry: Not on file  Inability: Not on file  . Transportation needs:    Medical: Not on file    Non-medical: Not on file  Tobacco Use  . Smoking status: Current Some Day Smoker    Types: Cigarettes  . Smokeless tobacco: Never Used  Substance and Sexual Activity  . Alcohol use: Yes    Comment: Refuses to disclose how much  . Drug use: No    Comment: Refuses to answer  . Sexual activity: Not on file     Comment: refused to answer  Lifestyle  . Physical activity:    Days per week: Not on file    Minutes per session: Not on file  . Stress: Not on file  Relationships  . Social connections:    Talks on phone: Not on file    Gets together: Not on file    Attends religious service: Not on file    Active member of club or organization: Not on file    Attends meetings of clubs or organizations: Not on file    Relationship status: Not on file  Other Topics Concern  . Not on file  Social History Narrative  . Not on file   Additional Social History:                         Sleep: Good  Appetite:  Poor  Current Medications: Current Facility-Administered Medications  Medication Dose Route Frequency Provider Last Rate Last Dose  . acetaminophen (TYLENOL) tablet 650 mg  650 mg Oral Q6H PRN Money, Gerlene Burdock, FNP      . alum & mag hydroxide-simeth (MAALOX/MYLANTA) 200-200-20 MG/5ML suspension 30 mL  30 mL Oral Q4H PRN Money, Feliz Beam B, FNP      . diphenhydrAMINE (BENADRYL) capsule 50 mg  50 mg Oral Q6H PRN Micheal Likens, MD       Or  . diphenhydrAMINE (BENADRYL) injection 50 mg  50 mg Intramuscular Q6H PRN Micheal Likens, MD   50 mg at 11/29/17 1020  . haloperidol (HALDOL) tablet 5 mg  5 mg Oral Q8H PRN Micheal Likens, MD       Or  . haloperidol lactate (HALDOL) injection 5 mg  5 mg Intramuscular Q8H PRN Micheal Likens, MD   5 mg at 11/28/17 0931  . hydrOXYzine (ATARAX/VISTARIL) tablet 50 mg  50 mg Oral Q6H PRN Micheal Likens, MD      . LORazepam (ATIVAN) tablet 2 mg  2 mg Oral Q6H PRN Micheal Likens, MD       Or  . LORazepam (ATIVAN) injection 2 mg  2 mg Intramuscular Q6H PRN Micheal Likens, MD   2 mg at 11/28/17 0930  . magnesium hydroxide (MILK OF MAGNESIA) suspension 30 mL  30 mL Oral Daily PRN Money, Feliz Beam B, FNP      . paliperidone (INVEGA SUSTENNA) injection 156 mg  156 mg Intramuscular Q30 days Otho Bellows, RPH   156 mg at 12/04/17 1030  . potassium chloride SA (K-DUR,KLOR-CON) CR tablet 40 mEq  40 mEq Oral Once Tegeler, Canary Brim, MD      . traZODone (DESYREL) tablet 50 mg  50 mg Oral QHS PRN Money, Gerlene Burdock, FNP        Lab Results: No results found for this or any previous visit (from the past 48 hour(s)).  Blood Alcohol level:  Lab Results  Component Value Date   ETH <10 11/17/2017   ETH <5 03/15/2017  Metabolic Disorder Labs: Lab Results  Component Value Date   HGBA1C 6.1 (H) 04/16/2015   MPG 128 04/16/2015   Lab Results  Component Value Date   PROLACTIN 13.6 04/16/2015   Lab Results  Component Value Date   CHOL 186 04/16/2015   TRIG 64 04/16/2015   HDL 55 04/16/2015   CHOLHDL 3.4 04/16/2015   VLDL 13 04/16/2015   LDLCALC 118 (H) 04/16/2015    Physical Findings: AIMS: Facial and Oral Movements Muscles of Facial Expression: None, normal Lips and Perioral Area: None, normal Jaw: None, normal Tongue: None, normal,Extremity Movements Upper (arms, wrists, hands, fingers): None, normal Lower (legs, knees, ankles, toes): None, normal, Trunk Movements Neck, shoulders, hips: None, normal, Overall Severity Severity of abnormal movements (highest score from questions above): None, normal Incapacitation due to abnormal movements: None, normal Patient's awareness of abnormal movements (rate only patient's report): No Awareness, Dental Status Current problems with teeth and/or dentures?: No Does patient usually wear dentures?: No  CIWA:  CIWA-Ar Total: 1 COWS:  COWS Total Score: 1  Musculoskeletal: Strength & Muscle Tone: within normal limits Gait & Station: normal Patient leans: [name]  Psychiatric Specialty Exam: Physical Exam  Nursing note and vitals reviewed. Constitutional: She appears well-developed.  Cardiovascular: Normal rate.  Neurological: She is alert.  Psychiatric: She has a normal mood and affect. Her behavior is normal.    Review of  Systems  Psychiatric/Behavioral: Positive for depression. Negative for hallucinations and suicidal ideas. The patient is nervous/anxious. The patient does not have insomnia.   All other systems reviewed and are negative.   Blood pressure (!) 79/52, pulse 94, temperature 97.8 F (36.6 C), temperature source Oral, resp. rate 16, SpO2 97 %.There is no height or weight on file to calculate BMI.  General Appearance: Disheveled, flat and guarded  Eye Contact:  Poor  Speech:  Clear and Coherent and Normal Rate  Volume:  Normal  Mood:  Dysphoric and Irritable  Affect:  Blunt  Thought Process:  Coherent and Goal Directed  Orientation:  Full (Time, Place, and Person)  Thought Content:  Paranoid Ideation  Suicidal Thoughts:  No  Homicidal Thoughts:  No  Memory:  Immediate;   Fair Recent;   Fair Remote;   Fair  Judgement:  Poor  Insight:  Lacking  Psychomotor Activity:  Normal  Concentration:  Concentration: Fair  Recall:  [employer] of Knowledge:  Fair  Language:  Fair  Akathisia:  No  Handed:    AIMS (if indicated):     Assets:  Social Support  ADL's:  Intact  Cognition:  WNL  Sleep:  Number of Hours: 6.75   Treatment Plan Summary: Daily contact with patient to assess and evaluate symptoms and progress in treatment and Medication management   -Continue to continue with current treatment plan on 12/04/2017 except wherenoted  -Second opinion for forced medications completed on 11/28/17 (see note)  -Schizophrenia              - Continue Initiated Gean Birchwood. Anticipate starting Invega Sustenna 156 mg IM q30 on Friday 12-03-17 Hinda Glatter Sustenna  IM administered 11/30/17)  -Agitation              -Continue haldol  po/IM q8h prn agitation              -Continue benadryl  po/IM q6h prn agitation             -Continue Ativan  po/IM q6h prn agitation  -Anxiety             -  Continue vistaril  po q6h prn anxiety  -Insomnia              -Continue trazodone   po qhs prn insomnia  -Encourage participation in groups and therapeutic milieu -Disposition planning will be ongoing  Oneta Rack, NP 12/04/2017, 1:38 PM

## 2017-12-04 NOTE — Progress Notes (Deleted)
Patient ID: [name], female   DOB: 1961/05/21, 57 y.o.   MRN: [id number]

## 2017-12-05 DIAGNOSIS — Z9111 Patient's noncompliance with dietary regimen: Secondary | ICD-10-CM

## 2017-12-05 NOTE — [facility] Group Notes (Signed)
[facility] Group Notes:  (Nursing)  Date:  12/05/2017  Time: 1:30 PM Type of Therapy:  Nurse Education  Participation Level:  Did Not Attend  Participation Quality:  did not attend  Affect:  did not attend  Cognitive:  did not attend  Insight:  None  Engagement in Group:  None  Modes of Intervention:  Did not attend  Summary of Progress/Problems:  Shela Nevin 12/05/2017, 3:12 PM

## 2017-12-05 NOTE — Plan of Care (Signed)
D:Patient observed in room in her bed awake. Patient denies SI/HI and A/V hallucinations. Patient offered food and fluids; however she declined offer. Patient encouraged to attend group. Patient remains isolative to room. A:Patient provided support and encouragement. Patient with Q 15 minute checks in progress for safety. Patient offered fluids while awake.  R:Patient did not attend group. Patient slept with uninterrupted sleep. Patient remains safe on unit.   Problem: Safety: Goal: Periods of time without injury will increase Outcome: Progressing Note:  Patient denies SI and remains safe on unit.   Problem: Self-Concept: Goal: Level of anxiety will decrease Outcome: Progressing Note:  Patient with no noted anxiety and did not require any medication for anxiety.

## 2017-12-05 NOTE — Progress Notes (Signed)
[facility] MD Progress Note  12/05/2017 10:19 AM [name]  MRN:  [id number]    Subjective: [name] seen sitting on the side of her bed in the dark. Continues to  presents flat, depressed and guarded.  Patient is asking about her discharge plan, patient continues to refuses medications by mouth. Reports " I took the shot yesterday all ready." continues to denied suicidal or homicidal ideations with short, abrupt responses. "no"  Chart reviewed patient continues to isolate to her room, and was encouraged to attended group sessions. No disrupted behaviors was charted.  Support and encouragement and reassurance was provided  History: [name] is a 57 y/o F with history of schizophrenia who was admitted from ED on IVC with internal preoccupation, response to internal stimuli, poor oral intake of food/hydration, agitation, and medication non-adherence. Pt had similar presentation to [facility] in 2016 for disorganized, agitated behaviors, at which time she was managed with Invega. Pt has been irritable, agitated at times, and uncooperative during this admission. Second opinion for forced medications was obtained on 11/28/17. Pt had been refusing to eat or drink during her stay, and she was sent to [facility] for rehydration on 11/28/17 at which time she received 4L of fluids; CK was found to be elevated but trended down while in the ED from 1447->1124->672 after 4L of IVF. She was returned to [facility] for additional treatment and stabilzation. Pt remained irritable, agitated at times, and generally uncooperative while on the unit. She refused oral medications and was receiving zyprexa IM twice daily, until she was changed to previous medication of [place] which was administered on 11/30/17. She has continued to refuse solid food but she has been accepting liquids and has been obtaining calories from choices such as juices. Pt's family (mother and sister [guardian]) have attempted to bring pt her favorite food from home, but she  has continued to decline to eat it.    Principal Problem: Schizophrenia (HCC) Diagnosis:   Patient Active Problem List   Diagnosis Date Noted  . Schizophrenia (HCC) [F20.9] 11/26/2017  . Noncompliance with diet and medication regimen [Z91.11, Z91.14]   . Psychoses (HCC) [F29]   . Hypokalemic alkalosis [E87.3] 11/11/2011  . Medically noncompliant [Z91.19] 11/11/2011  . Schizophrenia, paranoid (HCC) [F20.0] 11/07/2011   Total Time spent with patient: 30 minutes  Past Psychiatric History: See H&P  Past Medical History:  Past Medical History:  Diagnosis Date  . Non compliance w medication regimen   . Schizophrenia (HCC)    History reviewed. No pertinent surgical history. Family History: History reviewed. No pertinent family history. Family Psychiatric  History: See H&P Social History:  Social History   Substance and Sexual Activity  Alcohol Use Yes   Comment: Refuses to disclose how much     Social History   Substance and Sexual Activity  Drug Use No   Comment: Refuses to answer    Social History   Socioeconomic History  . Marital status: Single    Spouse name: Not on file  . Number of children: Not on file  . Years of education: Not on file  . Highest education level: Not on file  Occupational History  . Not on file  Social Needs  . Financial resource strain: Not on file  . Food insecurity:    Worry: Not on file    Inability: Not on file  . Transportation needs:    Medical: Not on file    Non-medical: Not on file  Tobacco Use  .  Smoking status: Current Some Day Smoker    Types: Cigarettes  . Smokeless tobacco: Never Used  Substance and Sexual Activity  . Alcohol use: Yes    Comment: Refuses to disclose how much  . Drug use: No    Comment: Refuses to answer  . Sexual activity: Not on file    Comment: refused to answer  Lifestyle  . Physical activity:    Days per week: Not on file    Minutes per session: Not on file  . Stress: Not on file   Relationships  . Social connections:    Talks on phone: Not on file    Gets together: Not on file    Attends religious service: Not on file    Active member of club or organization: Not on file    Attends meetings of clubs or organizations: Not on file    Relationship status: Not on file  Other Topics Concern  . Not on file  Social History Narrative  . Not on file   Additional Social History:                         Sleep: Good  Appetite:  Poor  Current Medications: Current Facility-Administered Medications  Medication Dose Route Frequency Provider Last Rate Last Dose  . acetaminophen (TYLENOL) tablet 650 mg  650 mg Oral Q6H PRN Money, Gerlene Burdock, FNP      . alum & mag hydroxide-simeth (MAALOX/MYLANTA) 200-200-20 MG/5ML suspension 30 mL  30 mL Oral Q4H PRN Money, Feliz Beam B, FNP      . diphenhydrAMINE (BENADRYL) capsule 50 mg  50 mg Oral Q6H PRN Micheal Likens, MD       Or  . diphenhydrAMINE (BENADRYL) injection 50 mg  50 mg Intramuscular Q6H PRN Micheal Likens, MD   50 mg at 11/29/17 1020  . haloperidol (HALDOL) tablet 5 mg  5 mg Oral Q8H PRN Micheal Likens, MD       Or  . haloperidol lactate (HALDOL) injection 5 mg  5 mg Intramuscular Q8H PRN Micheal Likens, MD   5 mg at 11/28/17 0931  . hydrOXYzine (ATARAX/VISTARIL) tablet 50 mg  50 mg Oral Q6H PRN Micheal Likens, MD      . LORazepam (ATIVAN) tablet 2 mg  2 mg Oral Q6H PRN Micheal Likens, MD       Or  . LORazepam (ATIVAN) injection 2 mg  2 mg Intramuscular Q6H PRN Micheal Likens, MD   2 mg at 11/28/17 0930  . magnesium hydroxide (MILK OF MAGNESIA) suspension 30 mL  30 mL Oral Daily PRN Money, Feliz Beam B, FNP      . paliperidone (INVEGA SUSTENNA) injection 156 mg  156 mg Intramuscular Q30 days Otho Bellows, RPH   156 mg at 12/04/17 1030  . potassium chloride SA (K-DUR,KLOR-CON) CR tablet 40 mEq  40 mEq Oral Once Tegeler, Canary Brim, MD      .  traZODone (DESYREL) tablet 50 mg  50 mg Oral QHS PRN Money, Gerlene Burdock, FNP        Lab Results: No results found for this or any previous visit (from the past 48 hour(s)).  Blood Alcohol level:  Lab Results  Component Value Date   [name] <10 11/17/2017   ETH <5 03/15/2017    Metabolic Disorder Labs: Lab Results  Component Value Date   HGBA1C 6.1 (H) 04/16/2015   MPG 128 04/16/2015   Lab Results  Component Value Date   PROLACTIN 13.6 04/16/2015   Lab Results  Component Value Date   CHOL 186 04/16/2015   TRIG 64 04/16/2015   HDL 55 04/16/2015   CHOLHDL 3.4 04/16/2015   VLDL 13 04/16/2015   LDLCALC 118 (H) 04/16/2015    Physical Findings: AIMS: Facial and Oral Movements Muscles of Facial Expression: None, normal Lips and Perioral Area: None, normal Jaw: None, normal Tongue: None, normal,Extremity Movements Upper (arms, wrists, hands, fingers): None, normal Lower (legs, knees, ankles, toes): None, normal, Trunk Movements Neck, shoulders, hips: None, normal, Overall Severity Severity of abnormal movements (highest score from questions above): None, normal Incapacitation due to abnormal movements: None, normal Patient's awareness of abnormal movements (rate only patient's report): No Awareness, Dental Status Current problems with teeth and/or dentures?: No Does patient usually wear dentures?: No  CIWA:  CIWA-Ar Total: 1 COWS:  COWS Total Score: 1  Musculoskeletal: Strength & Muscle Tone: within normal limits Gait & Station: normal Patient leans: [name]  Psychiatric Specialty Exam: Physical Exam  Nursing note and vitals reviewed. Constitutional: She appears well-developed.  Cardiovascular: Normal rate.  Neurological: She is alert.  Psychiatric: She has a normal mood and affect. Her behavior is normal.    Review of Systems  Psychiatric/Behavioral: Positive for depression. Negative for hallucinations and suicidal ideas. The patient is nervous/anxious. The patient does  not have insomnia.   All other systems reviewed and are negative.   Blood pressure 95/68, pulse 81, temperature 98.4 F (36.9 C), temperature source Oral, resp. rate 18, SpO2 97 %.There is no height or weight on file to calculate BMI.  General Appearance: Disheveled, flat and guarded  Eye Contact:  Poor  Speech:  Clear and Coherent and Normal Rate  Volume:  Normal  Mood:  Dysphoric and Irritable  Affect:  Blunt  Thought Process:  Coherent and Goal Directed  Orientation:  Full (Time, Place, and Person)  Thought Content:  Paranoid Ideation  Suicidal Thoughts:  No  Homicidal Thoughts:  No  Memory:  Immediate;   Fair Recent;   Fair Remote;   Fair  Judgement:  Poor  Insight:  Lacking  Psychomotor Activity:  Normal  Concentration:  Concentration: Fair  Recall:  [employer] of Knowledge:  Fair  Language:  Fair  Akathisia:  No  Handed:    AIMS (if indicated):     Assets:  Social Support  ADL's:  Intact  Cognition:  WNL  Sleep:  Number of Hours: 6.75   Treatment Plan Summary: Daily contact with patient to assess and evaluate symptoms and progress in treatment and Medication management   -Continue to continue with current treatment plan on 12/05/2017 except wherenoted  -Second opinion for forced medications completed on 11/28/17 (see note)  -Schizophrenia              - Continue Initiated Gean Birchwood. Anticipate starting Invega Sustenna 156 mg IM q30 on Friday 12-03-17 Hinda Glatter Lorelei Pont  IM administered 11/30/17)  -Agitation              -Continue haldol  po/IM q8h prn agitation              -Continue benadryl  po/IM q6h prn agitation             -Continue Ativan  po/IM q6h prn agitation  -Anxiety             -Continue vistaril  po q6h prn anxiety  -Insomnia              -  Continue trazodone  po qhs prn insomnia  -Encourage participation in groups and therapeutic milieu -Disposition planning will be ongoing  Paula Rack, NP 12/05/2017,  10:19 AM

## 2017-12-05 NOTE — [facility] Group Notes (Signed)
[facility] LCSW Group Therapy Note  Date/Time:  12/05/2017  11:00AM-12:00PM  Type of Therapy and Topic:  Group Therapy:  Music and Mood  Participation Level:  Minimal   Description of Group: In this process group, members listened to a variety of genres of music and identified that different types of music evoke different responses.  Patients were encouraged to identify music that was soothing for them and music that was energizing for them.  Patients discussed how this knowledge can help with wellness and recovery in various ways including managing depression and anxiety as well as encouraging healthy sleep habits.    Therapeutic Goals: 1. Patients will explore the impact of different varieties of music on mood 2. Patients will verbalize the thoughts they have when listening to different types of music 3. Patients will identify music that is soothing to them as well as music that is energizing to them 4. Patients will discuss how to use this knowledge to assist in maintaining wellness and recovery 5. Patients will explore the use of music as a coping skill  Summary of Patient Progress:  At the beginning of group, patient expressed that she felt "alright" and at the end of group during which she did not interact or comment at all, said she felt "the same."  Therapeutic Modalities: Solution Focused Brief Therapy Activity   Paula Mantle, LCSW

## 2017-12-05 NOTE — Progress Notes (Addendum)
D. Pt sitting on the side of her bed with arms crossed and head down upon initial approach. Pt presents with a flat affect and depressed behavior, but is cooperative and pleasant during interactions.  Pt currently denies SI/HI and AV hallucinations Pt brought pitcher of Gatorade and encouraged to drink. Pt reports that everything she needs to eat and drink "is at home", "there's nothing I need here". Per pt's self inventory, pt rates her depression, hopelessness and anxiety all 0's.  A. Labs and vitals monitored. Pt compliant with medications. Pt supported emotionally and encouraged to express concerns and ask questions.   R. Pt remains safe with 15 minute checks. Will continue POC.

## 2017-12-05 NOTE — Plan of Care (Signed)
D: Pt denies SI/HI/AVH. Pt is pleasant and cooperative during assessment, but appears irritated. Pt isolated to her room this evening.   A: Pt was offered support and encouragement. Pt was given scheduled medications. Pt was encourage to attend groups. Q 15 minute checks were done for safety.   R:Pt attends groups and interacts well with peers and staff. Pt is taking medication. Pt receptive to treatment and safety maintained on unit.   Problem: Education: Goal: Emotional status will improve Outcome: Progressing   Problem: Safety: Goal: Periods of time without injury will increase Outcome: Progressing   Problem: Coping: Goal: Coping ability will improve Outcome: Not Progressing   Problem: Role Relationship: Goal: Will demonstrate positive changes in social behaviors and relationships Outcome: Not Progressing

## 2017-12-06 MED ORDER — ENSURE ENLIVE PO LIQD: 237.0000 mL | Freq: Two times a day (BID) | ORAL | Status: DC

## 2017-12-06 NOTE — Plan of Care (Signed)
D: Pt denies SI/HI/AVH. Pt is pleasant and cooperative. Pt isolates to room stated she was ready to leave. Pt less irritable than she has been in the past.   A: Pt was offered support and encouragement. Pt was given scheduled medications. Pt was encourage to attend groups. Q 15 minute checks were done for safety.   R:Pt attends groups and interacts well with peers and staff. Pt is taking medication. Pt has no complaints.Pt receptive to treatment and safety maintained on unit.   Problem: Education: Goal: Knowledge of [facility] information/materials will improve Outcome: Not Progressing   Problem: Education: Goal: Emotional status will improve Outcome: Not Progressing   Problem: Activity: Goal: Sleeping patterns will improve Outcome: Progressing   Problem: Coping: Goal: Ability to demonstrate self-control will improve Outcome: Progressing   Problem: Health Behavior/Discharge Planning: Goal: Compliance with treatment plan for underlying cause of condition will improve Outcome: Progressing   Problem: Education: Goal: Knowledge of the prescribed therapeutic regimen will improve Outcome: Progressing

## 2017-12-06 NOTE — [facility] Group Notes (Signed)
LCSW Group Therapy Note   12/06/2017 1:15pm   Type of Therapy and Topic:  Group Therapy:  Overcoming Obstacles   Participation Level:  Minimal   Description of Group:    In this group patients will be encouraged to explore what they see as obstacles to their own wellness and recovery. They will be guided to discuss their thoughts, feelings, and behaviors related to these obstacles. The group will process together ways to cope with barriers, with attention given to specific choices patients can make. Each patient will be challenged to identify changes they are motivated to make in order to overcome their obstacles. This group will be process-oriented, with patients participating in exploration of their own experiences as well as giving and receiving support and challenge from other group members.   Therapeutic Goals: 1. Patient will identify personal and current obstacles as they relate to admission. 2. Patient will identify barriers that currently interfere with their wellness or overcoming obstacles.  3. Patient will identify feelings, thought process and behaviors related to these barriers. 4. Patient will identify two changes they are willing to make to overcome these obstacles:      Summary of Patient Progress   Stayed the entire time.  Appears depressed-flat affect, poor eye contact, poverty of speech.  "I don't have any obstacles that I can see. I just take it one day at a time.  I've been in this network too many times."   Therapeutic Modalities:   Cognitive Behavioral Therapy Solution Focused Therapy Motivational Interviewing Relapse Prevention Therapy  [name] Rogue, LCSW 12/06/2017 3:45 PM

## 2017-12-06 NOTE — Plan of Care (Signed)
  Problem: Safety: Goal: Periods of time without injury will increase Outcome: Progressing   Problem: Health Behavior/Discharge Planning: Goal: Compliance with therapeutic regimen will improve Outcome: Not Progressing   Problem: Self-Concept: Goal: Will verbalize positive feelings about self Outcome: Not Progressing  Dar Note: Patient presents with flat affect and depressed mood.  Continues to refuse food and fluid intake after several encouragements.  Denies suicidal thoughts, auditory and visual hallucinations.  Routine safety checks maintained every 15 minutes.  Support and encouragement offered as needed.  Patient visible in milieu briefly.  Minimal interaction with staff.  Patient is safe on the unit.

## 2017-12-06 NOTE — Progress Notes (Signed)
[facility] MD Progress Note  12/06/2017 4:00 PM [name]  MRN:  [id number]    Subjective: [name] seen sitting on the side of her bed in the dark. Reports " I am alright"  Remains flat and guarded. Patient  continues to refuses medications by mouth.  Reports " I have been drinking water."  Ensure feeding supplement was made available. Discussed contacting sister for nutritional  ideas. Ayesha reports " my sister is not over me any longer it is some man."  Np will follow-up with CSW for discharge disposition and and guardianship information.  Marquita continues to denied suicidal or homicidal ideations. Denies auditory or visual hallucination. patient continues to isolate to her room however has attending some group sessions. No disrupted behaviors was charted. B/P and HR as been reviewed by NP/ MD.    Support and encouragement and reassurance was provided  History: [name] is a 57 y/o F with history of schizophrenia who was admitted from ED on IVC with internal preoccupation, response to internal stimuli, poor oral intake of food/hydration, agitation, and medication non-adherence. Pt had similar presentation to [facility] in 2016 for disorganized, agitated behaviors, at which time she was managed with Invega. Pt has been irritable, agitated at times, and uncooperative during this admission. Second opinion for forced medications was obtained on 11/28/17. Pt had been refusing to eat or drink during her stay, and she was sent to [facility] for rehydration on 11/28/17 at which time she received 4L of fluids; CK was found to be elevated but trended down while in the ED from 1447->1124->672 after 4L of IVF. She was returned to [facility] for additional treatment and stabilzation. Pt remained irritable, agitated at times, and generally uncooperative while on the unit. She refused oral medications and was receiving zyprexa IM twice daily, until she was changed to previous medication of [place] which was administered on 11/30/17. She has  continued to refuse solid food but she has been accepting liquids and has been obtaining calories from choices such as juices. Pt's family (mother and sister [guardian]) have attempted to bring pt her favorite food from home, but she has continued to decline to eat it.    Principal Problem: Schizophrenia (HCC) Diagnosis:   Patient Active Problem List   Diagnosis Date Noted  . Schizophrenia (HCC) [F20.9] 11/26/2017  . Noncompliance with diet and medication regimen [Z91.11, Z91.14]   . Psychoses (HCC) [F29]   . Hypokalemic alkalosis [E87.3] 11/11/2011  . Medically noncompliant [Z91.19] 11/11/2011  . Schizophrenia, paranoid (HCC) [F20.0] 11/07/2011   Total Time spent with patient: 30 minutes  Past Psychiatric History: See H&P  Past Medical History:  Past Medical History:  Diagnosis Date  . Non compliance w medication regimen   . Schizophrenia (HCC)    History reviewed. No pertinent surgical history. Family History: History reviewed. No pertinent family history. Family Psychiatric  History: See H&P Social History:  Social History   Substance and Sexual Activity  Alcohol Use Yes   Comment: Refuses to disclose how much     Social History   Substance and Sexual Activity  Drug Use No   Comment: Refuses to answer    Social History   Socioeconomic History  . Marital status: Single    Spouse name: Not on file  . Number of children: Not on file  . Years of education: Not on file  . Highest education level: Not on file  Occupational History  . Not on file  Social Needs  . Physicist, medical  strain: Not on file  . Food insecurity:    Worry: Not on file    Inability: Not on file  . Transportation needs:    Medical: Not on file    Non-medical: Not on file  Tobacco Use  . Smoking status: Current Some Day Smoker    Types: Cigarettes  . Smokeless tobacco: Never Used  Substance and Sexual Activity  . Alcohol use: Yes    Comment: Refuses to disclose how much  . Drug use:  No    Comment: Refuses to answer  . Sexual activity: Not on file    Comment: refused to answer  Lifestyle  . Physical activity:    Days per week: Not on file    Minutes per session: Not on file  . Stress: Not on file  Relationships  . Social connections:    Talks on phone: Not on file    Gets together: Not on file    Attends religious service: Not on file    Active member of club or organization: Not on file    Attends meetings of clubs or organizations: Not on file    Relationship status: Not on file  Other Topics Concern  . Not on file  Social History Narrative  . Not on file   Additional Social History:                         Sleep: Good  Appetite:  Poor  Current Medications: Current Facility-Administered Medications  Medication Dose Route Frequency Provider Last Rate Last Dose  . acetaminophen (TYLENOL) tablet 650 mg  650 mg Oral Q6H PRN Money, Gerlene Burdock, FNP      . alum & mag hydroxide-simeth (MAALOX/MYLANTA) 200-200-20 MG/5ML suspension 30 mL  30 mL Oral Q4H PRN Money, Feliz Beam B, FNP      . diphenhydrAMINE (BENADRYL) capsule 50 mg  50 mg Oral Q6H PRN Micheal Likens, MD       Or  . diphenhydrAMINE (BENADRYL) injection 50 mg  50 mg Intramuscular Q6H PRN Micheal Likens, MD   50 mg at 11/29/17 1020  . haloperidol (HALDOL) tablet 5 mg  5 mg Oral Q8H PRN Micheal Likens, MD       Or  . haloperidol lactate (HALDOL) injection 5 mg  5 mg Intramuscular Q8H PRN Micheal Likens, MD   5 mg at 11/28/17 0931  . hydrOXYzine (ATARAX/VISTARIL) tablet 50 mg  50 mg Oral Q6H PRN Micheal Likens, MD      . LORazepam (ATIVAN) tablet 2 mg  2 mg Oral Q6H PRN Micheal Likens, MD       Or  . LORazepam (ATIVAN) injection 2 mg  2 mg Intramuscular Q6H PRN Micheal Likens, MD   2 mg at 11/28/17 0930  . magnesium hydroxide (MILK OF MAGNESIA) suspension 30 mL  30 mL Oral Daily PRN Money, Feliz Beam B, FNP      . paliperidone  (INVEGA SUSTENNA) injection 156 mg  156 mg Intramuscular Q30 days Otho Bellows, RPH   156 mg at 12/04/17 1030  . potassium chloride SA (K-DUR,KLOR-CON) CR tablet 40 mEq  40 mEq Oral Once Tegeler, Canary Brim, MD      . traZODone (DESYREL) tablet 50 mg  50 mg Oral QHS PRN Money, Gerlene Burdock, FNP        Lab Results: No results found for this or any previous visit (from the past 48 hour(s)).  Blood Alcohol  level:  Lab Results  Component Value Date   ETH <10 11/17/2017   ETH <5 03/15/2017    Metabolic Disorder Labs: Lab Results  Component Value Date   HGBA1C 6.1 (H) 04/16/2015   MPG 128 04/16/2015   Lab Results  Component Value Date   PROLACTIN 13.6 04/16/2015   Lab Results  Component Value Date   CHOL 186 04/16/2015   TRIG 64 04/16/2015   HDL 55 04/16/2015   CHOLHDL 3.4 04/16/2015   VLDL 13 04/16/2015   LDLCALC 118 (H) 04/16/2015    Physical Findings: AIMS: Facial and Oral Movements Muscles of Facial Expression: None, normal Lips and Perioral Area: None, normal Jaw: None, normal Tongue: None, normal,Extremity Movements Upper (arms, wrists, hands, fingers): None, normal Lower (legs, knees, ankles, toes): None, normal, Trunk Movements Neck, shoulders, hips: None, normal, Overall Severity Severity of abnormal movements (highest score from questions above): None, normal Incapacitation due to abnormal movements: None, normal Patient's awareness of abnormal movements (rate only patient's report): No Awareness, Dental Status Current problems with teeth and/or dentures?: No Does patient usually wear dentures?: No  CIWA:  CIWA-Ar Total: 1 COWS:  COWS Total Score: 1  Musculoskeletal: Strength & Muscle Tone: within normal limits Gait & Station: normal Patient leans: [name]  Psychiatric Specialty Exam: Physical Exam  Nursing note and vitals reviewed. Constitutional: She appears well-developed.  Cardiovascular: Normal rate.  Neurological: She is alert.  Psychiatric: She  has a normal mood and affect. Her behavior is normal.    Review of Systems  Psychiatric/Behavioral: Positive for depression. Negative for hallucinations and suicidal ideas. The patient is nervous/anxious. The patient does not have insomnia.   All other systems reviewed and are negative.   Blood pressure (!) 80/63, pulse 94, temperature 98.3 F (36.8 C), resp. rate 16, SpO2 97 %.There is no height or weight on file to calculate BMI.  General Appearance: Disheveled, flat and guarded  Eye Contact:  Poor  Speech:  Clear and Coherent and Normal Rate  Volume:  Normal  Mood:  Dysphoric and Irritable  Affect:  Blunt  Thought Process:  Coherent and Goal Directed  Orientation:  Full (Time, Place, and Person)  Thought Content:  Paranoid Ideation  Suicidal Thoughts:  No  Homicidal Thoughts:  No  Memory:  Immediate;   Fair Recent;   Fair Remote;   Fair  Judgement:  Poor  Insight:  Lacking  Psychomotor Activity:  Normal  Concentration:  Concentration: Fair  Recall:  [employer] of Knowledge:  Fair  Language:  Fair  Akathisia:  No  Handed:    AIMS (if indicated):     Assets:  Social Support  ADL's:  Intact  Cognition:  WNL  Sleep:  Number of Hours: 6.75   Treatment Plan Summary: Daily contact with patient to assess and evaluate symptoms and progress in treatment and Medication management   -Continue to continue with current treatment plan on 12/06/2017 except wherenoted  -Second opinion for forced medications completed on 11/28/17 (see note)  -Schizophrenia              - Continue Initiated Gean Birchwood. Anticipate starting Invega Sustenna 156 mg IM q30 on Friday 12-03-17 Hinda Glatter Lorelei Pont  IM administered 11/30/17)  -Agitation              -Continue haldol  po/IM q8h prn agitation              -Continue benadryl  po/IM q6h prn agitation             -  Continue Ativan  po/IM q6h prn agitation  -Anxiety             -Continue vistaril  po q6h prn  anxiety  -Insomnia              -Continue trazodone  po qhs prn insomnia  -Encourage participation in groups and therapeutic milieu -Disposition planning will be ongoing  Oneta Rack, NP 12/06/2017, 4:00 PM

## 2017-12-06 NOTE — Tx Team (Signed)
Interdisciplinary Treatment and Diagnostic Plan Update  12/06/2017 Time of Session: 3:20 PM  [name] MRN: [id number]  Principal Diagnosis: Schizophrenia [facility])  Secondary Diagnoses: Principal Problem:   Schizophrenia ([place])   Current Medications:  Current Facility-Administered Medications  Medication Dose Route Frequency Provider Last Rate Last Dose  . acetaminophen (TYLENOL) tablet 650 mg  650 mg Oral Q6H PRN Money, Lowry Ram, FNP      . alum & mag hydroxide-simeth (MAALOX/MYLANTA) 200-200-20 MG/5ML suspension 30 mL  30 mL Oral Q4H PRN Money, Darnelle Maffucci B, FNP      . diphenhydrAMINE (BENADRYL) capsule 50 mg  50 mg Oral Q6H PRN Pennelope Bracken, MD       Or  . diphenhydrAMINE (BENADRYL) injection 50 mg  50 mg Intramuscular Q6H PRN Pennelope Bracken, MD   50 mg at 11/29/17 1020  . haloperidol (HALDOL) tablet 5 mg  5 mg Oral Q8H PRN Pennelope Bracken, MD       Or  . haloperidol lactate (HALDOL) injection 5 mg  5 mg Intramuscular Q8H PRN Pennelope Bracken, MD   5 mg at 11/28/17 0931  . hydrOXYzine (ATARAX/VISTARIL) tablet 50 mg  50 mg Oral Q6H PRN Pennelope Bracken, MD      . LORazepam (ATIVAN) tablet 2 mg  2 mg Oral Q6H PRN Pennelope Bracken, MD       Or  . LORazepam (ATIVAN) injection 2 mg  2 mg Intramuscular Q6H PRN Pennelope Bracken, MD   2 mg at 11/28/17 0930  . magnesium hydroxide (MILK OF MAGNESIA) suspension 30 mL  30 mL Oral Daily PRN Money, Darnelle Maffucci B, FNP      . paliperidone (INVEGA SUSTENNA) injection 156 mg  156 mg Intramuscular Q30 days Minda Ditto, RPH   156 mg at 12/04/17 1030  . potassium chloride SA (K-DUR,KLOR-CON) CR tablet 40 mEq  40 mEq Oral Once Tegeler, Gwenyth Allegra, MD      . traZODone (DESYREL) tablet 50 mg  50 mg Oral QHS PRN Money, Lowry Ram, FNP        PTA Medications: No medications prior to admission.    Patient Stressors:    Patient Strengths:    Treatment Modalities: Medication Management, Group  therapy, Case management,  1 to 1 session with clinician, Psychoeducation, Recreational therapy.   Physician Treatment Plan for Primary Diagnosis: Schizophrenia ([place]) Long Term Goal(s): Improvement in symptoms so as ready for discharge  Short Term Goals: Ability to verbalize feelings will improve Compliance with prescribed medications will improve  Medication Management: Evaluate patient's response, side effects, and tolerance of medication regimen.  Therapeutic Interventions: 1 to 1 sessions, Unit Group sessions and Medication administration.  Evaluation of Outcomes: Progressing   5/10: She refused oral medications and was receiving zyprexa IM twice daily, until she was changed to previous medication of [place] which was administered on 11/30/17. She has continued to refuse solid food but she has been accepting liquids and has been obtaining calories from choices such as juices. Pt's family (mother and sister [guardian]) have attempted to bring pt her favorite food from home, but she has continued to decline to eat it.  - Continue Initiated Kirt Boys. Anticipate starting Invega Sustenna 156 mg IM q30 on Friday 12-03-17 Lorayne Bender Wilder Glade '234mg'$  IM administered 11/30/17)  -Agitation -Continue haldol '5mg'$  po/IM q8h prn agitation -Continue benadryl '50mg'$  po/IM q6h prn agitation -Continue Ativan '2mg'$  po/IM q6h prn agitation  -Anxiety -Continue vistaril '50mg'$  po q6h prn anxiety  Physician Treatment Plan for Secondary Diagnosis: Principal Problem:   Schizophrenia ([facility])   Long Term Goal(s): Improvement in symptoms so as ready for discharge  Short Term Goals: Ability to verbalize feelings will improve Compliance with prescribed medications will improve  Medication Management: Evaluate patient's response, side effects, and tolerance of medication regimen.  Therapeutic Interventions: 1 to 1 sessions, Unit Group  sessions and Medication administration.  Evaluation of Outcomes: Progressing   RN Treatment Plan for Primary Diagnosis: Schizophrenia ([place]) Long Term Goal(s): Knowledge of disease and therapeutic regimen to maintain health will improve  Short Term Goals: Ability to identify and develop effective coping behaviors will improve and Compliance with prescribed medications will improve  Medication Management: RN will administer medications as ordered by provider, will assess and evaluate patient's response and provide education to patient for prescribed medication. RN will report any adverse and/or side effects to prescribing provider.  Therapeutic Interventions: 1 on 1 counseling sessions, Psychoeducation, Medication administration, Evaluate responses to treatment, Monitor vital signs and CBGs as ordered, Perform/monitor CIWA, COWS, AIMS and Fall Risk screenings as ordered, Perform wound care treatments as ordered.  Evaluation of Outcomes: Progressing   LCSW Treatment Plan for Primary Diagnosis: Schizophrenia ([place]) Long Term Goal(s): Safe transition to appropriate next level of care at discharge, Engage patient in therapeutic group addressing interpersonal concerns.  Short Term Goals: Engage patient in aftercare planning with referrals and resources  Therapeutic Interventions: Assess for all discharge needs, 1 to 1 time with Social worker, Explore available resources and support systems, Assess for adequacy in community support network, Educate family and significant other(s) on suicide prevention, Complete Psychosocial Assessment, Interpersonal group therapy.  Evaluation of Outcomes: Met  Return home, follow up outpt   Progress in Treatment: Attending groups: Yes Participating in groups: Minimally Taking medication as prescribed: Yes Toleration medication: Yes, no side effects reported at this time Family/Significant other contact made: Yes Guardian Patient understands diagnosis: No  Limited insight Discussing patient identified problems/goals with staff: Yes Medical problems stabilized or resolved: Yes Denies suicidal/homicidal ideation: Yes Issues/concerns per patient self-inventory: None Other: [name]  New problem(s) identified: None identified at this time.   New Short Term/Long Term Goal(s): None identified at this time.   Discharge Plan or Barriers:   Reason for Continuation of Hospitalization:  Depression  Medication stabilization  Estimated Length of Stay: 5/24  Attendees: Patient: 12/06/2017  3:20 PM  Physician: Maris Berger, MD 12/06/2017  3:20 PM  Nursing: Sena Hitch, RN 12/06/2017  3:20 PM  RN Care Manager: Lars Pinks, RN 12/06/2017  3:20 PM  Social Worker: Ripley Fraise 12/06/2017  3:20 PM  Recreational Therapist: Winfield Cunas 12/06/2017  3:20 PM  Other: Norberto Sorenson 12/06/2017  3:20 PM  Other:  12/06/2017  3:20 PM    Scribe for Treatment Team:  Roque Lias LCSW 12/06/2017 3:20 PM

## 2017-12-07 NOTE — B[facility] Group Notes (Signed)
LCSW Group Therapy 12/07/2017 1:15pm  Type of Therapy and Topic:  Group Therapy:  Change and Accountability  Participation Level:  Minimal  Description of Group In this group, patients discussed power and accountability for change.  The group identified the challenges related to accountability and the difficulty of accepting the outcomes of negative behaviors.  Patients were encouraged to openly discuss a challenge/change they could take responsibility for.  Patients discussed the use of "change talk" and positive thinking as ways to support achievement of personal goals.  The group discussed ways to give support and empowerment to peers.  Therapeutic Goals: 1. Patients will state the relationship between personal power and accountability in the change process 2. Patients will identify the positive and negative consequences of a personal choice they have made 3. Patients will identify one challenge/choice they will take responsibility for making 4. Patients will discuss the role of "change talk" and the impact of positive thinking as it supports successful personal change 5. Patients will verbalize support and affirmation of change efforts in peers  Summary of Patient Progress:  Stayed the entire time, engagement level difficult to assess.  She did smile when another patient gave her the queen of hearts playing card, and told her she was "Love [name]."  Therapeutic Modalities Solution Focused Brief Therapy Motivational Interviewing Cognitive Behavioral Therapy  [name], [place] 12/07/2017 2:18 PM

## 2017-12-07 NOTE — Progress Notes (Signed)
Adult Psychoeducational Group Note  Date:  12/07/2017 Time:  9:15 AM  Group Topic/Focus:  Orientation:   The focus of this group is to educate the patient on the purpose and policies of crisis stabilization and provide a format to answer questions about their admission.  The group details unit policies and expectations of patients while admitted.  Participation Level:  Minimal  Participation Quality:  Inattentive  Affect:  Flat  Cognitive:  Appropriate  Insight: Lacking  Engagement in Group:  Lacking  Modes of Intervention:  Discussion  Additional Comments:  Pt said she didn't set a goal.  Pt said she was just here to listen.  Pt said she understood the expectations of the unit.  [name] 12/07/2017, 9:15 AM

## 2017-12-07 NOTE — Plan of Care (Signed)
  Problem: Education: Goal: Emotional status will improve Outcome: Progressing   Problem: Safety: Goal: Periods of time without injury will increase Outcome: Progressing   

## 2017-12-07 NOTE — Progress Notes (Signed)
Pt presents pt a flat affect. Pt observed sitting on the bed, with her arms folded, looking down at the floor. Pt noted to be calm and cooperative.  No irritability or agitation observed. Pt denies SI/HI. Pt denies AVH. Pt reports sleeping okay at bedtime. Pt stated that she didn't eat breakfast this morning but did have something to drink. Pt v/s assessed and MD made aware. Pt offered an ensure as ordered but declined.   Orders reviewed. Verbal support provided. Pt encouraged to eat meals, a snack and drink fluids. 15 minute checks performed for safety.

## 2017-12-07 NOTE — Plan of Care (Signed)
D: Pt denies SI/HI/AVH. Pt is pleasant and cooperative. Pt talk when engaged, but keeps to herself, pt continues to endorse leaving  A: Pt was offered support and encouragement.  Pt was encourage to attend groups. Q 15 minute checks were done for safety.   R: safety maintained on unit.   Problem: Activity: Goal: Interest or engagement in activities will improve Outcome: Not Progressing   Problem: Activity: Goal: Sleeping patterns will improve Outcome: Progressing   Problem: Coping: Goal: Ability to verbalize frustrations and anger appropriately will improve Outcome: Progressing

## 2017-12-07 NOTE — Progress Notes (Signed)
[facility] MD Progress Note  12/07/2017 9:25 AM [name]  MRN:  [id number] Subjective:[facility]    [name] is a 57 y/o F with history of schizophrenia who was admitted from ED on IVC with internal preoccupation, response to internal stimuli, poor oral intake of food/hydration, agitation, and medication non-adherence. Pt had similar presentation to [facility] in 2016 for disorganized, agitated behaviors, at which time she was managed with Invega. Pt has been irritable, agitated at times, and uncooperative during this admission. Second opinion for forced medications was obtained on 11/28/17. Pt had been refusing to eat or drink during her stay, and she was sent to [facility] for rehydration on 11/28/17 at which time she received 4L of fluids; CK was found to be elevated but trended down while in the ED from 1447->1124->672 after 4L of IVF. She was returned to [facility] for additional treatment and stabilzation.Pt remained irritable, agitated at times, and generally uncooperative while on the unit. She refused oral medications and was receiving zyprexa IM twice daily, until she was changed to previous medication of [place] which was administered on 11/30/17. She has continued to refuse solid food but she has been accepting liquids and has been obtaining calories from choices such as juices. Pt's family (mother and sister [guardian]) have attempted to bring pt her favorite food from home, but she has continued to decline to eat it. Pt has demonstrated slight, incremental improvement to her agitation, but she continues to refuse solid foods during her stay.  Today upon evaluation, pt shares, "I'm alright. I'm just here maintaining what I'm supposed to be doing." Pt denies any specific concerns. She is sleeping well. She continues to refuse solid foods but she is taking liquids with calories. She denies SI/HI/AH/VH. She reports that she plans to resume solid foods when she is discharged to home. She denies physical complaints, and she  reports she is tolerating her current medications without difficulty. Collateral information was obtained from pt's sister (guardian), and concern was raised that pt feels her mother should not be living with her. We will address this with the patient as this is remaining a barrier to pt being able to return home.   Principal Problem: Schizophrenia (HCC) Diagnosis:   Patient Active Problem List   Diagnosis Date Noted  . Schizophrenia (HCC) [F20.9] 11/26/2017  . Noncompliance with diet and medication regimen [Z91.11, Z91.14]   . Psychoses (HCC) [F29]   . Hypokalemic alkalosis [E87.3] 11/11/2011  . Medically noncompliant [Z91.19] 11/11/2011  . Schizophrenia, paranoid (HCC) [F20.0] 11/07/2011   Total Time spent with patient: 30 minutes  Past Psychiatric History: See H&P  Past Medical History:  Past Medical History:  Diagnosis Date  . Non compliance w medication regimen   . Schizophrenia (HCC)    History reviewed. No pertinent surgical history. Family History: History reviewed. No pertinent family history. Family Psychiatric  History: See H&P Social History:  Social History   Substance and Sexual Activity  Alcohol Use Yes   Comment: Refuses to disclose how much     Social History   Substance and Sexual Activity  Drug Use No   Comment: Refuses to answer    Social History   Socioeconomic History  . Marital status: Single    Spouse name: Not on file  . Number of children: Not on file  . Years of education: Not on file  . Highest education level: Not on file  Occupational History  . Not on file  Social Needs  . Physicist, medical  strain: Not on file  . Food insecurity:    Worry: Not on file    Inability: Not on file  . Transportation needs:    Medical: Not on file    Non-medical: Not on file  Tobacco Use  . Smoking status: Current Some Day Smoker    Types: Cigarettes  . Smokeless tobacco: Never Used  Substance and Sexual Activity  . Alcohol use: Yes    Comment:  Refuses to disclose how much  . Drug use: No    Comment: Refuses to answer  . Sexual activity: Not on file    Comment: refused to answer  Lifestyle  . Physical activity:    Days per week: Not on file    Minutes per session: Not on file  . Stress: Not on file  Relationships  . Social connections:    Talks on phone: Not on file    Gets together: Not on file    Attends religious service: Not on file    Active member of club or organization: Not on file    Attends meetings of clubs or organizations: Not on file    Relationship status: Not on file  Other Topics Concern  . Not on file  Social History Narrative  . Not on file   Additional Social History:                         Sleep: Good  Appetite:  Fair  Current Medications: Current Facility-Administered Medications  Medication Dose Route Frequency Provider Last Rate Last Dose  . acetaminophen (TYLENOL) tablet 650 mg  650 mg Oral Q6H PRN Money, Gerlene Burdock, FNP      . alum & mag hydroxide-simeth (MAALOX/MYLANTA) 200-200-20 MG/5ML suspension 30 mL  30 mL Oral Q4H PRN Money, Feliz Beam B, FNP      . diphenhydrAMINE (BENADRYL) capsule 50 mg  50 mg Oral Q6H PRN Micheal Likens, MD       Or  . diphenhydrAMINE (BENADRYL) injection 50 mg  50 mg Intramuscular Q6H PRN Micheal Likens, MD   50 mg at 11/29/17 1020  . feeding supplement (ENSURE ENLIVE) (ENSURE ENLIVE) liquid 237 mL  237 mL Oral BID BM Lewis, Tanika N, NP      . haloperidol (HALDOL) tablet 5 mg  5 mg Oral Q8H PRN Micheal Likens, MD       Or  . haloperidol lactate (HALDOL) injection 5 mg  5 mg Intramuscular Q8H PRN Micheal Likens, MD   5 mg at 11/28/17 0931  . hydrOXYzine (ATARAX/VISTARIL) tablet 50 mg  50 mg Oral Q6H PRN Micheal Likens, MD      . LORazepam (ATIVAN) tablet 2 mg  2 mg Oral Q6H PRN Micheal Likens, MD       Or  . LORazepam (ATIVAN) injection 2 mg  2 mg Intramuscular Q6H PRN Micheal Likens,  MD   2 mg at 11/28/17 0930  . magnesium hydroxide (MILK OF MAGNESIA) suspension 30 mL  30 mL Oral Daily PRN Money, Feliz Beam B, FNP      . paliperidone (INVEGA SUSTENNA) injection 156 mg  156 mg Intramuscular Q30 days Otho Bellows, RPH   156 mg at 12/04/17 1030  . potassium chloride SA (K-DUR,KLOR-CON) CR tablet 40 mEq  40 mEq Oral Once Tegeler, Canary Brim, MD      . traZODone (DESYREL) tablet 50 mg  50 mg Oral QHS PRN Money, Gerlene Burdock, FNP  Lab Results: No results found for this or any previous visit (from the past 48 hour(s)).  Blood Alcohol level:  Lab Results  Component Value Date   ETH <10 11/17/2017   ETH <5 03/15/2017    Metabolic Disorder Labs: Lab Results  Component Value Date   HGBA1C 6.1 (H) 04/16/2015   MPG 128 04/16/2015   Lab Results  Component Value Date   PROLACTIN 13.6 04/16/2015   Lab Results  Component Value Date   CHOL 186 04/16/2015   TRIG 64 04/16/2015   HDL 55 04/16/2015   CHOLHDL 3.4 04/16/2015   VLDL 13 04/16/2015   LDLCALC 118 (H) 04/16/2015    Physical Findings: AIMS: Facial and Oral Movements Muscles of Facial Expression: None, normal Lips and Perioral Area: None, normal Jaw: None, normal Tongue: None, normal,Extremity Movements Upper (arms, wrists, hands, fingers): None, normal Lower (legs, knees, ankles, toes): None, normal, Trunk Movements Neck, shoulders, hips: None, normal, Overall Severity Severity of abnormal movements (highest score from questions above): None, normal Incapacitation due to abnormal movements: None, normal Patient's awareness of abnormal movements (rate only patient's report): No Awareness, Dental Status Current problems with teeth and/or dentures?: No Does patient usually wear dentures?: No  CIWA:  CIWA-Ar Total: 1 COWS:  COWS Total Score: 1  Musculoskeletal: Strength & Muscle Tone: within normal limits Gait & Station: normal Patient leans: [name]  Psychiatric Specialty Exam: Physical Exam  Nursing  note and vitals reviewed.   Review of Systems  Constitutional: Negative for chills and fever.  Respiratory: Negative for cough and shortness of breath.   Cardiovascular: Negative for chest pain.  Gastrointestinal: Negative for abdominal pain, heartburn, nausea and vomiting.  Psychiatric/Behavioral: Negative for depression, hallucinations and suicidal ideas. The patient is not nervous/anxious and does not have insomnia.     Blood pressure (!) 87/64, pulse 93, temperature 98.4 F (36.9 C), temperature source Oral, resp. rate 16, SpO2 97 %.There is no height or weight on file to calculate BMI.  General Appearance: Casual and Fairly Groomed  Eye Contact:  Good  Speech:  Clear and Coherent and Normal Rate  Volume:  Normal  Mood:  Irritable  Affect:  Blunt and Congruent  Thought Process:  Coherent and Goal Directed  Orientation:  Full (Time, Place, and Person)  Thought Content:  Paranoid Ideation  Suicidal Thoughts:  No  Homicidal Thoughts:  No  Memory:  Immediate;   Fair Recent;   Fair Remote;   Fair  Judgement:  Poor  Insight:  Lacking  Psychomotor Activity:  Normal  Concentration:  Concentration: Fair  Recall:  [employer] of Knowledge:  Fair  Language:  Fair  Akathisia:  No  Handed:    AIMS (if indicated):     Assets:  Housing Physical Health Resilience Social Support  ADL's:  Intact  Cognition:  WNL  Sleep:  Number of Hours: 6.75   Treatment Plan Summary: Daily contact with patient to assess and evaluate symptoms and progress in treatment and Medication management   -Continue inpatient hospitalization  -Second opinion for forced medications completed on 11/28/17 (see note). Not renewed at this point as pt has received [place].  -Schizophrenia -ContinueInvega Lorelei Pont. Gala Lewandowsky Sustenna 156 mg IMq30on 12-04-17 Hinda Glatter Sustenna  IM administered 11/30/17)  -Agitation -Continue haldol  po/IM q8h prn  agitation -Continue benadryl  po/IM q6h prn agitation -Continue Ativan  po/IM q6h prn agitation  -Anxiety -Continue vistaril  po q6h prn anxiety  -Insomnia -Continue trazodone  po qhs prn insomnia  -  Encourage participation in groups and therapeutic milieu  -Disposition planning will be ongoing   Micheal Likens, MD 12/07/2017, 9:25 AM

## 2017-12-07 NOTE — Progress Notes (Signed)
Did not attend group 

## 2017-12-08 NOTE — Plan of Care (Signed)
D: Pt denies SI/HI/AVH. Pt is pleasant and cooperative. Pt continues to refuse to eat, but pt will drink sporadically.  Pt keeps to herself and her only focus is on D/C.   A: Pt was offered support and encouragement. Pt was encourage to attend groups. Q 15 minute checks were done for safety.   R: safety maintained on unit.   Problem: Education: Goal: Verbalization of understanding the information provided will improve Outcome: Not Progressing   Problem: Activity: Goal: Interest or engagement in activities will improve Outcome: Not Progressing   Problem: Activity: Goal: Sleeping patterns will improve Outcome: Progressing   Problem: Coping: Goal: Ability to demonstrate self-control will improve Outcome: Progressing   Problem: Safety: Goal: Periods of time without injury will increase Outcome: Progressing

## 2017-12-08 NOTE — Progress Notes (Signed)
Patient ID: [name], female   DOB: [birth year], 57 y.o.   MRN: [id number] PER STATE REGULATIONS 482.30  THIS CHART WAS REVIEWED FOR MEDICAL NECESSITY WITH RESPECT TO THE PATIENT'S ADMISSION/ DURATION OF STAY.  NEXT REVIEW DATE: 12/12/2017  Willa Rough, RN, BSN CASE MANAGER

## 2017-12-08 NOTE — Progress Notes (Signed)
D: Patient refused ensure.  She is observed sitting with a pitcher that she states, "has cranberry juice in it."  Patient informed nurse that she has been drinking fluids.  She refused to go to cafeteria for dinner.  She went down for lunch, however, refused to eat.    A: Continue to monitor medication management and MD orders.  Safety checks completed every 15 minutes per protocol.    R: Patient is isolative to room.  She is withdrawn and prefers to keep to herself.

## 2017-12-08 NOTE — Progress Notes (Signed)
DAR NOTE: Pt present with flat affect and depressed mood in the unit. Pt has been isolating herself in the room. Pt refused fluids intakes, refused breakfast. Pt encouraged to eat and drink. Pt denies physical pain.  As per self inventory, pt had a good night sleep, good appetite, normal energy, and good concentration. Pt rate depression at 0, hopeless ness at 0, and 0 anxiety. Pt's safety ensured with 15 minute and environmental checks. Pt currently denies SI/HI and A/V hallucinations. Pt verbally agrees to seek staff if SI/HI or A/VH occurs and to consult with staff before acting on these thoughts. Will continue POC.

## 2017-12-08 NOTE — Progress Notes (Signed)
[facility] MD Progress Note  12/08/2017 2:19 PM [name]  MRN:  [id number] Subjective:    [name] is a 57 y/o F with history of schizophrenia who was admitted from ED on IVC with internal preoccupation, response to internal stimuli, poor oral intake of food/hydration, agitation, and medication non-adherence. Pt had similar presentation to [name] in 2016 for disorganized, agitated behaviors, at which time she was managed with Invega. Pt has been irritable, agitated at times, and uncooperative during this admission. Second opinion for forced medications was obtained on 11/28/17. Pt had been refusing to eat or drink during her stay, and she was sent to [facility] for rehydration on 11/28/17 at which time she received 4L of fluids; CK was found to be elevated but trended down while in the ED from 1447->1124->672 after 4L of IVF. She was returned to [facility] for additional treatment and stabilzation.Pt remained irritable, agitated at times, and generally uncooperative while on the unit. She refused oral medications and was receiving zyprexa IM twice daily, until she was changed to previous medication of [place] which was administered on 11/30/17. She has continued to refuse solid food but she has been accepting liquids and has been obtaining calories from choices such as juices. Pt's family (mother and sister [guardian]) have attempted to bring pt her favorite food from home, but she has continued to decline to eat it.Pt has demonstrated slight, incremental improvement to her agitation, but she continues to refuse solid foods during her stay (she continues to take adequate hydration and calories from liquids such as juices).  Today upon evaluation, pt shares, "I'm alright." She is mildly irritable at times during interview, but she is generally cooperative. She denies any specific complaints aside from seeking her discharge. She denies SI/HI/AH/VH. She is sleeping well. Her appetite is adequate but she continues to  refuse solid food (still taking calories from liquids such as juices). Pt shares that she plans to restart solid food once at home. Discussed with patient about concerns of her sister (guardian) about her ability to stay with her mother at home due to paranoia. Pt states that staying with her mother will be a temporary arrangement. Discussed with patient that she may need to stay with her mother on a longer-term basis as determined by her guardian, and pt verbalized understanding. Also discussed with patient about concern from her sister that pt is paranoid about people entering the home to perform routine maintenance, and pt grew mildly more irritable and states, "It's a security issue." She has a few disorganized statements regarding the government, but spoke to pt concretely that she would need to be in agreement to allow for others (whom are invited) to enter the home at times, and pt was hesitant, but verbalized understanding. Discussed with patient that we would coordinate with her sister about arranging for discharge in the upcoming days if she maintains stability of her behavior and symptoms, and pt verbalized good understanding. She had no further questions, comments, or concerns.  Principal Problem: Schizophrenia (HCC) Diagnosis:   Patient Active Problem List   Diagnosis Date Noted  . Schizophrenia (HCC) [F20.9] 11/26/2017  . Noncompliance with diet and medication regimen [Z91.11, Z91.14]   . Psychoses (HCC) [F29]   . Hypokalemic alkalosis [E87.3] 11/11/2011  . Medically noncompliant [Z91.19] 11/11/2011  . Schizophrenia, paranoid (HCC) [F20.0] 11/07/2011   Total Time spent with patient: 30 minutes  Past Psychiatric History: see H&P  Past Medical History:  Past Medical History:  Diagnosis Date  .  Non compliance w medication regimen   . Schizophrenia (HCC)    History reviewed. No pertinent surgical history. Family History: History reviewed. No pertinent family history. Family  Psychiatric  History: see H&P Social History:  Social History   Substance and Sexual Activity  Alcohol Use Yes   Comment: Refuses to disclose how much     Social History   Substance and Sexual Activity  Drug Use No   Comment: Refuses to answer    Social History   Socioeconomic History  . Marital status: Single    Spouse name: Not on file  . Number of children: Not on file  . Years of education: Not on file  . Highest education level: Not on file  Occupational History  . Not on file  Social Needs  . Financial resource strain: Not on file  . Food insecurity:    Worry: Not on file    Inability: Not on file  . Transportation needs:    Medical: Not on file    Non-medical: Not on file  Tobacco Use  . Smoking status: Current Some Day Smoker    Types: Cigarettes  . Smokeless tobacco: Never Used  Substance and Sexual Activity  . Alcohol use: Yes    Comment: Refuses to disclose how much  . Drug use: No    Comment: Refuses to answer  . Sexual activity: Not on file    Comment: refused to answer  Lifestyle  . Physical activity:    Days per week: Not on file    Minutes per session: Not on file  . Stress: Not on file  Relationships  . Social connections:    Talks on phone: Not on file    Gets together: Not on file    Attends religious service: Not on file    Active member of club or organization: Not on file    Attends meetings of clubs or organizations: Not on file    Relationship status: Not on file  Other Topics Concern  . Not on file  Social History Narrative  . Not on file   Additional Social History:                         Sleep: Good  Appetite:  Poor  Current Medications: Current Facility-Administered Medications  Medication Dose Route Frequency Provider Last Rate Last Dose  . acetaminophen (TYLENOL) tablet 650 mg  650 mg Oral Q6H PRN Money, Gerlene Burdock, FNP      . alum & mag hydroxide-simeth (MAALOX/MYLANTA) 200-200-20 MG/5ML suspension 30 mL   30 mL Oral Q4H PRN Money, Feliz Beam B, FNP      . diphenhydrAMINE (BENADRYL) capsule 50 mg  50 mg Oral Q6H PRN Micheal Likens, MD       Or  . diphenhydrAMINE (BENADRYL) injection 50 mg  50 mg Intramuscular Q6H PRN Micheal Likens, MD   50 mg at 11/29/17 1020  . feeding supplement (ENSURE ENLIVE) (ENSURE ENLIVE) liquid 237 mL  237 mL Oral BID BM Lewis, Tanika N, NP      . haloperidol (HALDOL) tablet 5 mg  5 mg Oral Q8H PRN Micheal Likens, MD       Or  . haloperidol lactate (HALDOL) injection 5 mg  5 mg Intramuscular Q8H PRN Micheal Likens, MD   5 mg at 11/28/17 0931  . hydrOXYzine (ATARAX/VISTARIL) tablet 50 mg  50 mg Oral Q6H PRN Micheal Likens, MD      .  LORazepam (ATIVAN) tablet 2 mg  2 mg Oral Q6H PRN Micheal Likens, MD       Or  . LORazepam (ATIVAN) injection 2 mg  2 mg Intramuscular Q6H PRN Micheal Likens, MD   2 mg at 11/28/17 0930  . magnesium hydroxide (MILK OF MAGNESIA) suspension 30 mL  30 mL Oral Daily PRN Money, Feliz Beam B, FNP      . paliperidone (INVEGA SUSTENNA) injection 156 mg  156 mg Intramuscular Q30 days Otho Bellows, RPH   156 mg at 12/04/17 1030  . potassium chloride SA (K-DUR,KLOR-CON) CR tablet 40 mEq  40 mEq Oral Once Tegeler, Canary Brim, MD      . traZODone (DESYREL) tablet 50 mg  50 mg Oral QHS PRN Money, Gerlene Burdock, FNP        Lab Results: No results found for this or any previous visit (from the past 48 hour(s)).  Blood Alcohol level:  Lab Results  Component Value Date   ETH <10 11/17/2017   ETH <5 03/15/2017    Metabolic Disorder Labs: Lab Results  Component Value Date   HGBA1C 6.1 (H) 04/16/2015   MPG 128 04/16/2015   Lab Results  Component Value Date   PROLACTIN 13.6 04/16/2015   Lab Results  Component Value Date   CHOL 186 04/16/2015   TRIG 64 04/16/2015   HDL 55 04/16/2015   CHOLHDL 3.4 04/16/2015   VLDL 13 04/16/2015   LDLCALC 118 (H) 04/16/2015    Physical  Findings: AIMS: Facial and Oral Movements Muscles of Facial Expression: None, normal Lips and Perioral Area: None, normal Jaw: None, normal Tongue: None, normal,Extremity Movements Upper (arms, wrists, hands, fingers): None, normal Lower (legs, knees, ankles, toes): None, normal, Trunk Movements Neck, shoulders, hips: None, normal, Overall Severity Severity of abnormal movements (highest score from questions above): None, normal Incapacitation due to abnormal movements: None, normal Patient's awareness of abnormal movements (rate only patient's report): No Awareness, Dental Status Current problems with teeth and/or dentures?: No Does patient usually wear dentures?: No  CIWA:  CIWA-Ar Total: 1 COWS:  COWS Total Score: 1  Musculoskeletal: Strength & Muscle Tone: within normal limits Gait & Station: normal Patient leans: [name]  Psychiatric Specialty Exam: Physical Exam  Nursing note and vitals reviewed.   Review of Systems  Constitutional: Negative for chills and fever.  Respiratory: Negative for cough and shortness of breath.   Cardiovascular: Negative for chest pain.  Gastrointestinal: Negative for abdominal pain, heartburn, nausea and vomiting.  Psychiatric/Behavioral: Positive for depression. Negative for hallucinations and suicidal ideas. The patient is not nervous/anxious and does not have insomnia.     Blood pressure 99/71, pulse 96, temperature 98.5 F (36.9 C), temperature source Oral, resp. rate 16, SpO2 97 %.There is no height or weight on file to calculate BMI.  General Appearance: Casual and Fairly Groomed  Eye Contact:  Fair  Speech:  Clear and Coherent and Normal Rate  Volume:  Normal  Mood:  Irritable  Affect:  Blunt and Labile  Thought Process:  Coherent, Disorganized, Goal Directed and Descriptions of Associations: Loose  Orientation:  Full (Time, Place, and Person)  Thought Content:  Paranoid Ideation  Suicidal Thoughts:  No  Homicidal Thoughts:  No   Memory:  Immediate;   Fair Recent;   Fair Remote;   Fair  Judgement:  Poor  Insight:  Lacking  Psychomotor Activity:  Normal  Concentration:  Concentration: Fair  Recall:  [employer] of Knowledge:  Fair  Language:  Fair  Akathisia:  No  Handed:    AIMS (if indicated):     Assets:  Social Support  ADL's:  Intact  Cognition:  WNL  Sleep:  Number of Hours: 6.75   Treatment Plan Summary: Daily contact with patient to assess and evaluate symptoms and progress in treatment and Medication management   -Continue inpatient hospitalization  -Second opinion for forced medications completed on 11/28/17 (see note). Not renewed at this point as pt has received [place] first and second doses and will not be due for next injection until January 01, 2018.  -Schizophrenia -ContinueInvega Sustenna. Gala Lewandowsky Sustenna 156 mg [id number] Dayson 12-04-17 Hinda Glatter Sustenna  IM administered 11/30/17)  -Agitation -Continue haldol  po/IM q8h prn agitation -Continue benadryl  po/IM q6h prn agitation -Continue Ativan  po/IM q6h prn agitation  -Anxiety -Continue vistaril  po q6h prn anxiety  -Insomnia -Continue trazodone  po qhs prn insomnia  -Encourage participation in groups and therapeutic milieu  -Disposition planning will be ongoing   Micheal Likens, MD 12/08/2017, 2:19 PM

## 2017-12-08 NOTE — BHH Group Notes (Signed)
LCSW Group Therapy Note  12/08/2017 1:15pm  Type of Therapy/Topic:  Group Therapy:  Balance in Life  Participation Level:  Minimal  Description of Group:    This group will address the concept of balance and how it feels and looks when one is unbalanced. Patients will be encouraged to process areas in their lives that are out of balance and identify reasons for remaining unbalanced. Facilitators will guide patients in utilizing problem-solving interventions to address and correct the stressor making their life unbalanced. Understanding and applying boundaries will be explored and addressed for obtaining and maintaining a balanced life. Patients will be encouraged to explore ways to assertively make their unbalanced needs known to significant others in their lives, using other group members and facilitator for support and feedback.  Therapeutic Goals: 1. Patient will identify two or more emotions or situations they have that consume much of in their lives. 2. Patient will identify signs/triggers that life has become out of balance:  3. Patient will identify two ways to set boundaries in order to achieve balance in their lives:  4. Patient will demonstrate ability to communicate their needs through discussion and/or role plays  Summary of Patient Progress:  Stayed the entire time, minimal participation.  Offered nothing voluntarily.  When asked directly, stated she did not feel the topic was relevent to her.    Therapeutic Modalities:   Cognitive Behavioral Therapy Solution-Focused Therapy Assertiveness Training  [name] Rogue, [place] 12/08/2017 1:16 PM

## 2017-12-08 NOTE — BHH Counselor (Addendum)
[facility]-finally got a follow up appointment for patient next week with psychiatrist, Dr Valerie Salts.  Also found out they have a [facility] referred to as  "miccam" for folks who are at high risk and need more attention. Azalee Course at [phone]. Called and left a message. He called back and agreed to follow up with guardian sister, whether patient attends hospital follow up appointment or not.

## 2017-12-09 NOTE — Progress Notes (Signed)
Did not attend group 

## 2017-12-09 NOTE — Progress Notes (Signed)
[facility] MD Progress Note  12/09/2017 1:12 PM [name]  MRN:  [id number]  Subjective: [name] reports, "I'm doing alright. I'm just here. My mood is fine. I have been out there today to the Dayroom, but, there is not much going on, I came back to my room. I guess I'm doing okay on the medicines. I still don't wanna eat, will not eat the food here. But, I drank some cranberry juice this morning".   [name] is a 57 y/o F with history of schizophrenia who was admitted from ED on IVC with internal preoccupation, response to internal stimuli, poor oral intake of food/hydration, agitation, and medication non-adherence. Pt had similar presentation to [facility] in 2016 for disorganized, agitated behaviors, at which time she was managed with Invega. Pt has been irritable, agitated at times, and uncooperative during this admission. Second opinion for forced medications was obtained on 11/28/17. Pt had been refusing to eat or drink during her stay, and she was sent to [facility] for rehydration on 11/28/17 at which time she received 4L of fluids; CK was found to be elevated but trended down while in the ED from 1447->1124->672 after 4L of IVF. She was returned to [facility] for additional treatment and stabilzation.Pt remained irritable, agitated at times, and generally uncooperative while on the unit. She refused oral medications and was receiving zyprexa IM twice daily, until she was changed to previous medication of [place] which was administered on 11/30/17. She has continued to refuse solid food but she has been accepting liquids and has been obtaining calories from choices such as juices. Pt's family (mother and sister [guardian]) have attempted to bring pt her favorite food from home, but she has continued to decline to eat it.Pt has demonstrated slight, incremental improvement to her agitation, but she continues to refuse solid foods during her stay (she continues to take adequate hydration and calories from liquids such as  juices).  Today upon evaluation, patients presents the same as she always does.  She shares, "I'm alright." She is generally cooperative. She denies any specific complaints aside from seeking her discharge. She denies SI/HI/AH/VH. She is sleeping well. Her appetite is adequate but she continues to refuse solid food (still taking calories from liquids such as juices). Pt had shared previously with the psychiatrist that she planned to restart solid food once at home. The psychiatrist discussed with patient about concerns of her sister (guardian) about her ability to stay with her mother at home due to paranoia. Pt stated that staying with her mother will be a temporary arrangement. The Md discussed with patient that she may need to stay with her mother on a longer-term basis as determined by her guardian, and pt verbalized understanding. The psychiatrist also discussed with patient about concern from her sister that pt is paranoid about people entering the home to perform routine maintenance. This made this patient grew mildly more irritable and stated, "It's a security issue." She has a few disorganized statements regarding the government, but spoke to pt concretely that she would need to be in agreement to allow for others (whom are invited) to enter the home at times, and pt was hesitant, but verbalized understanding. Patient was made aware that we would coordinate with her sister about arranging for discharge in the upcoming days if she maintains stability of her behavior and symptoms, and pt verbalized good understanding. She had no further questions, comments, or concerns.  Principal Problem: Schizophrenia (HCC) Diagnosis:   Patient Active Problem List  Diagnosis Date Noted  . Schizophrenia (HCC) [F20.9] 11/26/2017    Priority: High  . Noncompliance with diet and medication regimen [Z91.11, Z91.14]   . Psychoses (HCC) [F29]   . Hypokalemic alkalosis [E87.3] 11/11/2011  . Medically noncompliant  [Z91.19] 11/11/2011  . Schizophrenia, paranoid (HCC) [F20.0] 11/07/2011   Total Time spent with patient: 15 minutes  Past Psychiatric History: See H&P  Past Medical History:  Past Medical History:  Diagnosis Date  . Non compliance w medication regimen   . Schizophrenia (HCC)    History reviewed. No pertinent surgical history.  Family History: History reviewed. No pertinent family history.  Family Psychiatric  History: See H&P  Social History:  Social History   Substance and Sexual Activity  Alcohol Use Yes   Comment: Refuses to disclose how much     Social History   Substance and Sexual Activity  Drug Use No   Comment: Refuses to answer    Social History   Socioeconomic History  . Marital status: Single    Spouse name: Not on file  . Number of children: Not on file  . Years of education: Not on file  . Highest education level: Not on file  Occupational History  . Not on file  Social Needs  . Financial resource strain: Not on file  . Food insecurity:    Worry: Not on file    Inability: Not on file  . Transportation needs:    Medical: Not on file    Non-medical: Not on file  Tobacco Use  . Smoking status: Current Some Day Smoker    Types: Cigarettes  . Smokeless tobacco: Never Used  Substance and Sexual Activity  . Alcohol use: Yes    Comment: Refuses to disclose how much  . Drug use: No    Comment: Refuses to answer  . Sexual activity: Not on file    Comment: refused to answer  Lifestyle  . Physical activity:    Days per week: Not on file    Minutes per session: Not on file  . Stress: Not on file  Relationships  . Social connections:    Talks on phone: Not on file    Gets together: Not on file    Attends religious service: Not on file    Active member of club or organization: Not on file    Attends meetings of clubs or organizations: Not on file    Relationship status: Not on file  Other Topics Concern  . Not on file  Social History Narrative   . Not on file   Additional Social History:   Sleep: Good  Appetite:  Poor  Current Medications: Current Facility-Administered Medications  Medication Dose Route Frequency Provider Last Rate Last Dose  . acetaminophen (TYLENOL) tablet 650 mg  650 mg Oral Q6H PRN Money, Gerlene Burdock, FNP      . alum & mag hydroxide-simeth (MAALOX/MYLANTA) 200-200-20 MG/5ML suspension 30 mL  30 mL Oral Q4H PRN Money, Feliz Beam B, FNP      . diphenhydrAMINE (BENADRYL) capsule 50 mg  50 mg Oral Q6H PRN Micheal Likens, MD       Or  . diphenhydrAMINE (BENADRYL) injection 50 mg  50 mg Intramuscular Q6H PRN Micheal Likens, MD   50 mg at 11/29/17 1020  . feeding supplement (ENSURE ENLIVE) (ENSURE ENLIVE) liquid 237 mL  237 mL Oral BID BM Lewis, Tanika N, NP      . haloperidol (HALDOL) tablet 5 mg  5 mg  Oral Q8H PRN Micheal Likens, MD       Or  . haloperidol lactate (HALDOL) injection 5 mg  5 mg Intramuscular Q8H PRN Micheal Likens, MD   5 mg at 11/28/17 0931  . hydrOXYzine (ATARAX/VISTARIL) tablet 50 mg  50 mg Oral Q6H PRN Micheal Likens, MD      . LORazepam (ATIVAN) tablet 2 mg  2 mg Oral Q6H PRN Micheal Likens, MD       Or  . LORazepam (ATIVAN) injection 2 mg  2 mg Intramuscular Q6H PRN Micheal Likens, MD   2 mg at 11/28/17 0930  . magnesium hydroxide (MILK OF MAGNESIA) suspension 30 mL  30 mL Oral Daily PRN Money, Feliz Beam B, FNP      . paliperidone (INVEGA SUSTENNA) injection 156 mg  156 mg Intramuscular Q30 days Otho Bellows, RPH   156 mg at 12/04/17 1030  . potassium chloride SA (K-DUR,KLOR-CON) CR tablet 40 mEq  40 mEq Oral Once Tegeler, Canary Brim, MD      . traZODone (DESYREL) tablet 50 mg  50 mg Oral QHS PRN Money, Gerlene Burdock, FNP       Lab Results: No results found for this or any previous visit (from the past 48 hour(s)).  Blood Alcohol level:  Lab Results  Component Value Date   ETH <10 11/17/2017   ETH <5 03/15/2017   Metabolic  Disorder Labs: Lab Results  Component Value Date   HGBA1C 6.1 (H) 04/16/2015   MPG 128 04/16/2015   Lab Results  Component Value Date   PROLACTIN 13.6 04/16/2015   Lab Results  Component Value Date   CHOL 186 04/16/2015   TRIG 64 04/16/2015   HDL 55 04/16/2015   CHOLHDL 3.4 04/16/2015   VLDL 13 04/16/2015   LDLCALC 118 (H) 04/16/2015   Physical Findings: AIMS: Facial and Oral Movements Muscles of Facial Expression: None, normal Lips and Perioral Area: None, normal Jaw: None, normal Tongue: None, normal,Extremity Movements Upper (arms, wrists, hands, fingers): None, normal Lower (legs, knees, ankles, toes): None, normal, Trunk Movements Neck, shoulders, hips: None, normal, Overall Severity Severity of abnormal movements (highest score from questions above): None, normal Incapacitation due to abnormal movements: None, normal Patient's awareness of abnormal movements (rate only patient's report): No Awareness, Dental Status Current problems with teeth and/or dentures?: No Does patient usually wear dentures?: No  CIWA:  CIWA-Ar Total: 1 COWS:  COWS Total Score: 1  Musculoskeletal: Strength & Muscle Tone: within normal limits Gait & Station: normal Patient leans: [name]  Psychiatric Specialty Exam: Physical Exam  Nursing note and vitals reviewed.   Review of Systems  Constitutional: Negative for chills and fever.  Respiratory: Negative for cough and shortness of breath.   Cardiovascular: Negative for chest pain.  Gastrointestinal: Negative for abdominal pain, heartburn, nausea and vomiting.  Psychiatric/Behavioral: Positive for depression. Negative for hallucinations and suicidal ideas. The patient is not nervous/anxious and does not have insomnia.     Blood pressure (!) 89/60, pulse 92, temperature 98.8 F (37.1 C), temperature source Oral, resp. rate 18, SpO2 97 %.There is no height or weight on file to calculate BMI.  General Appearance: Casual and Fairly Groomed   Eye Contact:  Fair  Speech:  Clear and Coherent and Normal Rate  Volume:  Normal  Mood:  Irritable  Affect:  Blunt and Labile  Thought Process:  Coherent, Disorganized, Goal Directed and Descriptions of Associations: Loose  Orientation:  Full (Time, Place, and Person)  Thought Content:  Paranoid Ideation  Suicidal Thoughts:  No  Homicidal Thoughts:  No  Memory:  Immediate;   Fair Recent;   Fair Remote;   Fair  Judgement:  Poor  Insight:  Lacking  Psychomotor Activity:  Normal  Concentration:  Concentration: Fair  Recall:  [employer] of Knowledge:  Fair  Language:  Fair  Akathisia:  No  Handed:    AIMS (if indicated):     Assets:  Social Support  ADL's:  Intact  Cognition:  WNL  Sleep:  Number of Hours: 6.75   Treatment Plan Summary: Daily contact with patient to assess and evaluate symptoms and progress in treatment and Medication management   -Continue inpatient hospitalization.  -Will continue today 12/09/2017 plan as below except where it is noted.  -Second opinion for forced medications completed on 11/28/17 (see note). Not renewed at this point as pt has received [place] first and second doses and will not be due for next injection until January 01, 2018.  -Schizophrenia -ContinueInvega Sustenna. StartedInvega Sustenna 156 mg IMQ 28 Dayson 12-04-17 Hinda Glatter Sustenna  IM administered 11/30/17)  -Agitation -Continue haldol  po/IM q8h prn agitation -Continue benadryl  po/IM q6h prn agitation -Continue Ativan  po/IM q6h prn agitation  -Anxiety -Continue vistaril  po q6h prn anxiety  -Insomnia -Continue trazodone  po qhs prn insomnia  -Encourage participation in groups and therapeutic milieu  -Disposition planning will be ongoing   Armandina Stammer, NP, PMHNP, FNP-BC. 12/09/2017, 1:12 PMPatient ID: [name], female   DOB: 17-Jan-1961, 57 y.o.   MRN:  [id number]

## 2017-12-09 NOTE — Progress Notes (Signed)
  [facility] Adult Case Management Discharge Plan :  Will you be returning to the same living situation after discharge:  Yes,  home At discharge, do you have transportation home?: Yes,  sister Do you have the ability to pay for your medications: Yes,  insurance  Release of information consent forms completed and in the chart;  Patient's signature needed at discharge.  Patient to Follow up at: Follow-up Information    [facility] on 12/14/2017.   Why:  Tuesday at 10AM with Dr Michaelene Song for your hospital follow up appointment.  Bring along your hospital d/c paperwork Contact information: [address] [facility] [address] [place] [address] [phone]           Next level of care provider has access to [facility] Link:no  Safety Planning and Suicide Prevention discussed: Yes,  yes     Has patient been referred to the Quitline?: [name] patient is not a smoker  Patient has been referred for addiction treatment: [name]  [name] Rogue, LCSW 12/09/2017, 3:43 PM

## 2017-12-09 NOTE — Plan of Care (Signed)
D: Pt denies SI/HI/AVH. Pt is pleasant and cooperative. Pt isolates to her room, pt stated she was ok.   A: Pt was offered support and encouragement. Pt was encourage to attend groups. Q 15 minute checks were done for safety.  R: safety maintained on unit.   Problem: Education: Goal: Emotional status will improve Outcome: Progressing   Problem: Activity: Goal: Interest or engagement in activities will improve Outcome: Not Progressing   Problem: Coping: Goal: Ability to demonstrate self-control will improve Outcome: Progressing   Problem: Safety: Goal: Periods of time without injury will increase Outcome: Progressing

## 2017-12-09 NOTE — Tx Team (Signed)
Interdisciplinary Treatment and Diagnostic Plan Update  12/09/2017 Time of Session: 9:44 AM  [name] MRN: [id number]  Principal Diagnosis: Schizophrenia (HCC)  Secondary Diagnoses: Principal Problem:   Schizophrenia (HCC)   Current Medications:  Current Facility-Administered Medications  Medication Dose Route Frequency Provider Last Rate Last Dose  . acetaminophen (TYLENOL) tablet 650 mg  650 mg Oral Q6H PRN Money, Travis B, FNP      . alum & mag hydroxide-simeth (MAALOX/MYLANTA) 200-200-20 MG/5ML suspension 30 mL  30 mL Oral Q4H PRN Money, Travis B, FNP      . diphenhydrAMINE (BENADRYL) capsule 50 mg  50 mg Oral Q6H PRN [place], Paula T, MD       Or  . diphenhydrAMINE (BENADRYL) injection 50 mg  50 mg Intramuscular Q6H PRN [place], Paula T, MD   50 mg at 11/29/17 1020  . feeding supplement (ENSURE ENLIVE) (ENSURE ENLIVE) liquid 237 mL  237 mL Oral BID BM Lewis, Tanika N, NP      . haloperidol (HALDOL) tablet 5 mg  5 mg Oral Q8H PRN [place], Paula T, MD       Or  . haloperidol lactate (HALDOL) injection 5 mg  5 mg Intramuscular Q8H PRN [place], Paula T, MD   5 mg at 11/28/17 0931  . hydrOXYzine (ATARAX/VISTARIL) tablet 50 mg  50 mg Oral Q6H PRN [place], Paula T, MD      . LORazepam (ATIVAN) tablet 2 mg  2 mg Oral Q6H PRN [place], Paula T, MD       Or  . LORazepam (ATIVAN) injection 2 mg  2 mg Intramuscular Q6H PRN [place], Paula T, MD   2 mg at 11/28/17 0930  . magnesium hydroxide (MILK OF MAGNESIA) suspension 30 mL  30 mL Oral Daily PRN Money, Travis B, FNP      . paliperidone (INVEGA SUSTENNA) injection 156 mg  156 mg Intramuscular Q30 days [name], RPH   156 mg at 12/04/17 1030  . potassium chloride SA (K-DUR,KLOR-CON) CR tablet 40 mEq  40 mEq Oral Once Tegeler, Paula J, MD      . traZODone (DESYREL) tablet 50 mg  50 mg Oral QHS PRN Money, Travis B, FNP        PTA Medications: No medications prior to  admission.    Patient Stressors:    Patient Strengths:    Treatment Modalities: Medication Management, Group therapy, Case management,  1 to 1 session with clinician, Psychoeducation, Recreational therapy.   Physician Treatment Plan for Primary Diagnosis: Schizophrenia (HCC) Long Term Goal(s): Improvement in symptoms so as ready for discharge  Short Term Goals: Ability to verbalize feelings will improve Compliance with prescribed medications will improve  Medication Management: Evaluate patient's response, side effects, and tolerance of medication regimen.  Therapeutic Interventions: 1 to 1 sessions, Unit Group sessions and Medication administration.  Evaluation of Outcomes: Adequate for Discharge   5/10: She refused oral medications and was receiving zyprexa IM twice daily, until she was changed to previous medication of Invega Sustenna which was administered on 11/30/17. She has continued to refuse solid food but she has been accepting liquids and has been obtaining calories from choices such as juices. Pt's family (mother and sister [guardian]) have attempted to bring pt her favorite food from home, but she has continued to decline to eat it.  - Continue Initiated Invega Sustenna. Anticipate starting Invega Sustenna 156 mg IM q30 on Friday 12-03-17 (Invega Sustenna 234mg IM administered 11/30/17)  -Agitation -Continue haldol 5mg po/IM q8h   prn agitation -Continue benadryl 87m po/IM q6h prn agitation -Continue Ativan 226mpo/IM q6h prn agitation  -Anxiety -Continue vistaril 5063mo q6h prn anxiety      Physician Treatment Plan for Secondary Diagnosis: Principal Problem:   Schizophrenia (HCCKonterra Long Term Goal(s): Improvement in symptoms so as ready for discharge  Short Term Goals: Ability to verbalize feelings will improve Compliance with prescribed medications will improve  Medication Management: Evaluate  patient's response, side effects, and tolerance of medication regimen.  Therapeutic Interventions: 1 to 1 sessions, Unit Group sessions and Medication administration.  Evaluation of Outcomes: Adequate for Discharge   RN Treatment Plan for Primary Diagnosis: Schizophrenia (HCCElizabethong Term Goal(s): Knowledge of disease and therapeutic regimen to maintain health will improve  Short Term Goals: Ability to identify and develop effective coping behaviors will improve and Compliance with prescribed medications will improve  Medication Management: RN will administer medications as ordered by provider, will assess and evaluate patient's response and provide education to patient for prescribed medication. RN will report any adverse and/or side effects to prescribing provider.  Therapeutic Interventions: 1 on 1 counseling sessions, Psychoeducation, Medication administration, Evaluate responses to treatment, Monitor vital signs and CBGs as ordered, Perform/monitor CIWA, COWS, AIMS and Fall Risk screenings as ordered, Perform wound care treatments as ordered.  Evaluation of Outcomes: Adequate for Discharge   LCSW Treatment Plan for Primary Diagnosis: [facility]izophrenia ([facility] Center[facility] Term Goal(s): Safe transition to appropriate next level of care at discharge, Engage patient in therapeutic group addressing interpersonal concerns.  Short Term Goals: Engage patient in aftercare planning with referrals and resources  Therapeutic Interventions: Assess for all discharge needs, 1 to 1 time with Social worker, Explore available resources and support systems, Assess for adequacy in community support network, Educate family and significant other(s) on suicide prevention, Complete Psychosocial Assessment, Interpersonal group therapy.  Evaluation of Outcomes: Met  Return home, follow up outpt   Progress in Treatment: Attending groups: Yes Participating in groups: Minimally Taking medication as prescribed:  Yes Toleration medication: Yes, no side effects reported at this time Family/Significant other contact made: Yes Guardian Patient understands diagnosis: No Limited insight Discussing patient identified problems/goals with staff: Yes Medical problems stabilized or resolved: Yes Denies suicidal/homicidal ideation: Yes Issues/concerns per patient self-inventory: None Other: [name]/A  New problem(s) identified: None identified at this time.   New Short Term/Long Term Goal(s): None identified at this time.   Discharge Plan or Barriers:   Reason for Continuation of Hospitalization:      Estimated Length of Stay: D/C tomorrow  Attendees: Patient: 12/09/2017  9:44 AM  Physician: ChrMaris BergerD 12/09/2017  9:44 AM  Nursing: EliSena HitchN 12/09/2017  9:44 AM  RN Care Manager: JenLars PinksN 12/09/2017  9:44 AM  Social Worker: RodRipley Fraise23/2019  9:44 AM  Recreational Therapist: MarWinfield Cunas23/2019  9:44 AM  Other: DelNorberto Sorenson23/2019  9:44 AM  Other:  12/09/2017  9:44 AM    Scribe for Treatment Team:  RodRoque LiasSW 12/09/2017 9:44 AM

## 2017-12-09 NOTE — Progress Notes (Signed)
Patient denies SI, Hi and AVH this shift.   Patient has had no incidents of behavioral dyscontrol. Patient has taken all medications and attended groups.   Assess Patient for safety, offer medications as prescribed, engage patient in 1:1 staff talks.   Patient able to contract for safety, Continue to monitor as planned.

## 2017-12-10 MED ORDER — HYDROXYZINE HCL 50 MG PO TABS: 50.0000 mg | ORAL_TABLET | Freq: Four times a day (QID) | ORAL | 0 refills | Status: DC | PRN

## 2017-12-10 MED ORDER — PALIPERIDONE PALMITATE ER 156 MG/ML IM SUSY: 156.0000 mg | PREFILLED_SYRINGE | INTRAMUSCULAR | 0 refills | Status: DC

## 2017-12-10 MED ORDER — TRAZODONE HCL 50 MG PO TABS: 50.0000 mg | ORAL_TABLET | Freq: Every evening | ORAL | 0 refills | Status: DC | PRN

## 2017-12-10 NOTE — Discharge Summary (Addendum)
Physician Discharge Summary Note  Patient:  [name] is an 57 y.o., female MRN:  [id number] DOB:  [birth year] Patient phone:  [phone] (home)  Patient address:   [address] [place] [address],  Total Time spent with patient: Greater than 30 minutes  Date of Admission:  11/26/2017  Date of Discharge: 12-10-17  Reason for Admission: Internal preoccupation, response to internal stimuli, poor oral intake of food/hydration, agitation, and medication non-adherence.   Principal Problem: Schizophrenia [facility])  Discharge Diagnoses: Patient Active Problem List   Diagnosis Date Noted  . Schizophrenia (HCC) [F20.9] 11/26/2017    Priority: High  . Noncompliance with diet and medication regimen [Z91.11, Z91.14]   . Psychoses (HCC) [F29]   . Hypokalemic alkalosis [E87.3] 11/11/2011  . Medically noncompliant [Z91.19] 11/11/2011  . Schizophrenia, paranoid (HCC) [F20.0] 11/07/2011   Past Psychiatric History: Hx. Schizophrenia.  Past Medical History:  Past Medical History:  Diagnosis Date  . Non compliance w medication regimen   . Schizophrenia (HCC)    History reviewed. No pertinent surgical history.  Family History: History reviewed. No pertinent family history.  Family Psychiatric  History: See H&P  Social History:  Social History   Substance and Sexual Activity  Alcohol Use Yes   Comment: Refuses to disclose how much     Social History   Substance and Sexual Activity  Drug Use No   Comment: Refuses to answer    Social History   Socioeconomic History  . Marital status: Single    Spouse name: Not on file  . Number of children: Not on file  . Years of education: Not on file  . Highest education level: Not on file  Occupational History  . Not on file  Social Needs  . Financial resource strain: Not on file  . Food insecurity:    Worry: Not on file    Inability: Not on file  . Transportation needs:    Medical: Not on file    Non-medical: Not on file   Tobacco Use  . Smoking status: Current Some Day Smoker    Types: Cigarettes  . Smokeless tobacco: Never Used  Substance and Sexual Activity  . Alcohol use: Yes    Comment: Refuses to disclose how much  . Drug use: No    Comment: Refuses to answer  . Sexual activity: Not on file    Comment: refused to answer  Lifestyle  . Physical activity:    Days per week: Not on file    Minutes per session: Not on file  . Stress: Not on file  Relationships  . Social connections:    Talks on phone: Not on file    Gets together: Not on file    Attends religious service: Not on file    Active member of club or organization: Not on file    Attends meetings of clubs or organizations: Not on file    Relationship status: Not on file  Other Topics Concern  . Not on file  Social History Narrative  . Not on file   Hospital Course: (Per Md's discharge SRA): [name] is a 57 y/o F with history of schizophrenia who was admitted from ED on IVC with internal preoccupation, response to internal stimuli, poor oral intake of food/hydration, agitation, and medication non-adherence. Pt had similar presentation to [facility] in 2016 for disorganized, agitated behaviors, at which time she was managed with Invega. Pt has been irritable, agitated at times, and uncooperative during this admission. Second opinion  for forced medications was obtained on 11/28/17. Pt had been refusing to eat or drink during her stay, and she was sent to [facility] for rehydration on 11/28/17 at which time she received 4L of fluids; CK was found to be elevated but trended down while in the ED from 1447->1124->672 after 4L of IVF. She was returned to [facility] for additional treatment and stabilzation.Pt remained irritable, agitated at times, and generally uncooperative while on the unit. She refused oral medications and was receiving zyprexa IM twice daily, until she was changed to previous medication of [place] which was administered on 11/30/17. She has  continued to refuse solid food but she has been accepting liquids and has been obtaining calories from choices such as juices. Pt's family (mother and sister [guardian]) have attempted to bring pt her favorite food from home, but she has continued to decline to eat it.Pt has demonstrated slight, incremental improvement to her agitation, but she continues to refuse solid foods during her stay(she continues to take adequate hydration and calories from liquids such as juices).  Besides the encouragement & support provided from staff for patient to eat regular food & to take her medications for mood stabilization of her mental health crisis, Consetta was also enrolled in the group counseling sessions being offered & held on this unit. She usually will attend the group sessions but, never participated. She is normally seen sitting quietly per herself inside her room or in the day room. Other than refusal to eat or take medications, Talicia presented no disruptive behavior on the unit. She was always polite towards the staff during follow-up care interviews.  Today upon evaluation, pt shares, "It's just a little rough. I feel sluggish- didn't sleep so good." Pt denies other specific concerns today. She denies physical complaints. She reports her appetite is adequate, but she continues to refuse to eat solid foods while admitted (though she continues to have adequate hydration and calorie intake from fluids such as juices). She denies SI/HI/AH/VH. She remains somewhat irritable and paranoid regarding her family. She denies having a guardian and denies having a sister, despite previously acknowledging those facts during her stay. She has no safety concerns about returning home, and states she will return to eating solid foods after discharge. Pt was encouraged to follow up on outpatient level at the [place] and continue to receive monthly injections as this will likely aid in preventing further hospitalizations. Pt verbalized  understanding. She was able to engage in safety planning including plan to return to [facility] or contact emergency services if she feels unable to maintain her own safety or the safety of others. Pt had no further questions, comments, or concerns.  Upon discharge, Lillyona presents mentally more stable than when first admitted. She still presents thin framed as a proof that she may have indeed lost some wight since her mental health crisis prior to this hospitalization, coupled with her refusal to eat solid food. She will continue mental health care & medication management on an outpatient basis as noted below. She is provided with all the necessary information needed to help her make this appointment without problems. She left [facility] with all personal belongings in no apparent distress. Transportation per family.  Physical Findings: AIMS: Facial and Oral Movements Muscles of Facial Expression: None, normal Lips and Perioral Area: None, normal Jaw: None, normal Tongue: None, normal,Extremity Movements Upper (arms, wrists, hands, fingers): None, normal Lower (legs, knees, ankles, toes): None, normal, Trunk Movements Neck, shoulders, hips: None, normal, Overall  Severity Severity of abnormal movements (highest score from questions above): None, normal Incapacitation due to abnormal movements: None, normal Patient's awareness of abnormal movements (rate only patient's report): No Awareness, Dental Status Current problems with teeth and/or dentures?: No Does patient usually wear dentures?: No  CIWA:  CIWA-Ar Total: 1 COWS:  COWS Total Score: 1  Musculoskeletal: Strength & Muscle Tone: within normal limits Gait & Station: normal Patient leans: [name]  Psychiatric Specialty Exam: Physical Exam  Nursing note and vitals reviewed. Constitutional: She appears well-developed.  HENT:  Head: Normocephalic.  Eyes: Pupils are equal, round, and reactive to light.  Neck: Normal range of motion.  Cardiovascular:  Normal rate.  Respiratory: Effort normal.  Genitourinary:  Genitourinary Comments: Deferred  Musculoskeletal: Normal range of motion.  Neurological: She is alert.  Skin: Skin is warm.    Review of Systems  Constitutional: Positive for weight loss (Refusing to eat).  HENT: Negative.   Eyes: Negative.   Respiratory: Negative.   Cardiovascular: Negative.   Gastrointestinal: Negative.   Genitourinary: Negative.   Musculoskeletal: Negative.   Skin: Negative.   Neurological: Negative.   Endo/Heme/Allergies: Negative.   Psychiatric/Behavioral: Positive for hallucinations ( Hx. paraonoid ideations, delusional thinking). Negative for depression, memory loss, substance abuse and suicidal ideas. The patient is not nervous/anxious and does not have insomnia.     Blood pressure (!) 70/55, pulse (!) 116, temperature (!) 97.5 F (36.4 C), temperature source Oral, resp. rate 16, SpO2 97 %.There is no height or weight on file to calculate BMI.  See Md's SRA   Has this patient used any form of tobacco in the last 30 days? (Cigarettes, Smokeless Tobacco, Cigars, and/or Pipes) : [name]  Blood Alcohol level:  Lab Results  Component Value Date   ETH <10 11/17/2017   ETH <5 03/15/2017   Metabolic Disorder Labs:  Lab Results  Component Value Date   HGBA1C 6.1 (H) 04/16/2015   MPG 128 04/16/2015   Lab Results  Component Value Date   PROLACTIN 13.6 04/16/2015   Lab Results  Component Value Date   CHOL 186 04/16/2015   TRIG 64 04/16/2015   HDL 55 04/16/2015   CHOLHDL 3.4 04/16/2015   VLDL 13 04/16/2015   LDLCALC 118 (H) 04/16/2015   See Psychiatric Specialty Exam and Suicide Risk Assessment completed by Attending Physician prior to discharge.  Discharge destination:  Home  Is patient on multiple antipsychotic therapies at discharge:  No   Has Patient had three or more failed trials of antipsychotic monotherapy by history:  No  Recommended Plan for Multiple Antipsychotic  Therapies: NA  Allergies as of 12/10/2017      Reactions   Aspirin Other (See Comments)   Pt refused to state reaction. Pt's sister states its an allergy and unsure of reaction.   Geodon [ziprasidone Hcl]    Other reaction(s): Other (See Comments) Body movements Listed in [facility] from [facility]      Medication List    TAKE these medications     Indication  hydrOXYzine 50 MG tablet Commonly known as:  ATARAX/VISTARIL Take 1 tablet (50 mg total) by mouth every 6 (six) hours as needed (mild anxiety).  Indication:  Feeling Anxious   paliperidone 156 MG/ML Susy injection Commonly known as:  INVEGA SUSTENNA Inject 1 mL (156 mg total) into the muscle every 30 (thirty) days. (Due on 01-03-18): For mood control Start taking on:  01/03/2018  Indication:  Mood Control   traZODone 50 MG tablet Commonly known as:  DESYREL Take 1 tablet (50 mg total) by mouth at bedtime as needed for sleep.  Indication:  Trouble Sleeping      Follow-up Information    Clinic, [facility] Go on 12/14/2017.   Why:  Tuesday at 10AM with Dr Michaelene Song for your hospital follow up appointment.  Bring along your hospital d/c paperwork Contact information: [address] [facility] [place] [address] [phone]          Follow-up recommendations: Activity:  As tolerated Diet: As recommended by your primary care doctor. Keep all scheduled follow-up appointments as recommended.   Comments:  Patient is instructed prior to discharge to: Take all medications as prescribed by his/her mental healthcare provider. Report any adverse effects and or reactions from the medicines to his/her outpatient provider promptly. Patient has been instructed & cautioned: To not engage in alcohol and or illegal drug use while on prescription medicines. In the event of worsening symptoms, patient is instructed to call the crisis hotline, 911 and or go to the nearest ED for appropriate evaluation and  treatment of symptoms. To follow-up with his/her primary care provider for your other medical issues, concerns and or health care needs.   Signed: Armandina Stammer, NP, PMHNP, FNP-BC 12/10/2017, 10:01 AM   Patient seen, Suicide Assessment Completed.  Disposition Plan Reviewed

## 2017-12-10 NOTE — Progress Notes (Signed)
Patient discharged to lobby. Patient was stable and appreciative at that time. All papers and prescriptions were given and valuables returned. Verbal understanding expressed. Denies SI/HI and A/VH. Patient given opportunity to express concerns and ask questions.  

## 2017-12-10 NOTE — [facility] Suicide Risk Assessment (Signed)
[facility] Discharge Suicide Risk Assessment   Principal Problem: Schizophrenia [facility]) Discharge Diagnoses:  Patient Active Problem List   Diagnosis Date Noted  . Schizophrenia (HCC) [F20.9] 11/26/2017  . Noncompliance with diet and medication regimen [Z91.11, Z91.14]   . Psychoses (HCC) [F29]   . Hypokalemic alkalosis [E87.3] 11/11/2011  . Medically noncompliant [Z91.19] 11/11/2011  . Schizophrenia, paranoid (HCC) [F20.0] 11/07/2011    Total Time spent with patient: 30 minutes  Musculoskeletal: Strength & Muscle Tone: within normal limits Gait & Station: normal Patient leans: [name]  Psychiatric Specialty Exam: Review of Systems  Constitutional: Negative for chills and fever.  Respiratory: Negative for cough and shortness of breath.   Cardiovascular: Negative for chest pain.  Gastrointestinal: Negative for abdominal pain, heartburn, nausea and vomiting.  Psychiatric/Behavioral: Negative for depression, hallucinations and suicidal ideas. The patient is not nervous/anxious and does not have insomnia.     Blood pressure (!) 70/55, pulse (!) 116, temperature (!) 97.5 F (36.4 C), temperature source Oral, resp. rate 16, SpO2 97 %.There is no height or weight on file to calculate BMI.  General Appearance: Casual and Fairly Groomed  Patent attorney::  Good  Speech:  Clear and Coherent and Normal Rate  Volume:  Normal  Mood:  Irritable  Affect:  Blunt and Congruent  Thought Process:  Goal Directed  Orientation:  Full (Time, Place, and Person)  Thought Content:  Delusions and Paranoid Ideation  Suicidal Thoughts:  No  Homicidal Thoughts:  No  Memory:  Immediate;   Fair Recent;   Fair Remote;   Fair  Judgement:  Poor  Insight:  Lacking  Psychomotor Activity:  Normal  Concentration:  Fair  Recall:  [employer] of Knowledge:Fair  Language: Fair  Akathisia:  No  Handed:    AIMS (if indicated):     Assets:  Resilience Social Support  Sleep:  Number of Hours: 6.75  Cognition: WNL   ADL's:  Intact   Mental Status Per Nursing Assessment::   On Admission:     Demographic Factors:  Low socioeconomic status and Unemployed  Loss Factors: Financial problems/change in socioeconomic status  Historical Factors: Impulsivity  Risk Reduction Factors:   Living with another person, especially a relative, Positive social support, Positive therapeutic relationship and Positive coping skills or problem solving skills  Continued Clinical Symptoms:  Severe Anxiety and/or Agitation Schizophrenia:   Paranoid or undifferentiated type  Cognitive Features That Contribute To Risk:  Closed-mindedness and Polarized thinking    Suicide Risk:  Minimal: No identifiable suicidal ideation.  Patients presenting with no risk factors but with morbid ruminations; may be classified as minimal risk based on the severity of the depressive symptoms  Follow-up Information    [facility] Go on [facility]14/2017.   Why:  Tuesday at 10AM with Dr Michaelene Song for your hospital follow up appointment.  Bring along your hospital d/c paperwork Contact information: [address] [facility] [place] [address] [phone]         Subjective Data: [name] is a 57 y/o F with history of schizophrenia who was admitted from ED on IVC with internal preoccupation, response to internal stimuli, poor oral intake of food/hydration, agitation, and medication non-adherence. Pt had similar presentation to [facility] in 2016 for disorganized, agitated behaviors, at which time she was managed with Invega. Pt has been irritable, agitated at times, and uncooperative during this admission. Second opinion for forced medications was obtained on 11/28/17. Pt had been refusing to eat or drink during her stay, and  she was sent to [facility] for rehydration on 11/28/17 at which time she received 4L of fluids; CK was found to be elevated but trended down while in the ED from 1447->1124->672 after 4L of IVF. She was  returned to [facility] for additional treatment and stabilzation.Pt remained irritable, agitated at times, and generally uncooperative while on the unit. She refused oral medications and was receiving zyprexa IM twice daily, until she was changed to previous medication of [place] which was administered on 11/30/17. She has continued to refuse solid food but she has been accepting liquids and has been obtaining calories from choices such as juices. Pt's family (mother and sister [guardian]) have attempted to bring pt her favorite food from home, but she has continued to decline to eat it.Pt has demonstrated slight, incremental improvement to her agitation, but she continues to refuse solid foods during her stay (she continues to take adequate hydration and calories from liquids such as juices).  Today upon evaluation, pt shares, "It's just a little rough. I feel sluggish- didn't sleep so good." Pt denies other specific concerns today. She denies physical complaints. She reports her appetite is adequate, but she continues to refuse to eat solid foods while admitted (though she continues to have adequate hydration and calorie intake from fluids such as juices). She denies SI/HI/AH/VH. She remains somewhat irritable and paranoid regarding her family. She denies having a guardian and denies having a sister, despite previously acknowledging those facts during her stay. She has no safety concerns about returning home, and states she will return to eating solid foods after discharge. Pt was encouraged to follow up on outpatient level at the [place] and continue to receive monthly injections as this will likely aid in preventing further hospitalizations. Pt verbalized understanding. She was able to engage in safety planning including plan to return to [facility] or contact emergency services if she feels unable to maintain her own safety or the safety of others. Pt had no further questions, comments, or concerns.    Plan Of  Care/Follow-up recommendations:   -Discharge to outpatient level of care  -Second opinion for forced medications completed on 11/28/17 (see note). Not renewed as pt has received [place] first and second doses and will not be due for next injection until January 01, 2018.  -Schizophrenia -ContinueInvega Sustenna. StartedInvega Sustenna 156 mg ZOX09 Dayson 12-04-17 [facility] Lorelei Pont  IM administered 11/30/17)  -Anxiety -Continue vistaril  po q6h prn anxiety  -Insomnia -Continue trazodone  po qhs prn insomnia  Activity:  as tolerated Diet:  normal Tests:  NA Other:  see above for DC plan  Micheal Likens, MD 12/10/2017, 11:42 AM

## 2018-07-31 ENCOUNTER — Emergency Department (HOSPITAL_COMMUNITY)
Admission: EM | Admit: 2018-07-31 | Discharge: 2018-08-02 | Disposition: A | Discharge status: Other Acute Inpatient Hospital | Payer: Medicare Other | Attending: Emergency Medicine | Admitting: Emergency Medicine

## 2018-07-31 ENCOUNTER — Other Ambulatory Visit: Payer: Self-pay

## 2018-07-31 ENCOUNTER — Encounter (HOSPITAL_COMMUNITY): Payer: Self-pay | Admitting: Obstetrics and Gynecology

## 2018-07-31 DIAGNOSIS — F200 Paranoid schizophrenia: Secondary | ICD-10-CM | POA: Diagnosis present

## 2018-07-31 DIAGNOSIS — Z049 Encounter for examination and observation for unspecified reason: Secondary | ICD-10-CM

## 2018-07-31 DIAGNOSIS — Z79899 Other long term (current) drug therapy: Secondary | ICD-10-CM | POA: Diagnosis not present

## 2018-07-31 DIAGNOSIS — F209 Schizophrenia, unspecified: Secondary | ICD-10-CM | POA: Insufficient documentation

## 2018-07-31 DIAGNOSIS — R456 Violent behavior: Secondary | ICD-10-CM | POA: Diagnosis present

## 2018-07-31 DIAGNOSIS — Z046 Encounter for general psychiatric examination, requested by authority: Secondary | ICD-10-CM

## 2018-07-31 DIAGNOSIS — R259 Unspecified abnormal involuntary movements: Secondary | ICD-10-CM | POA: Diagnosis not present

## 2018-07-31 DIAGNOSIS — F17210 Nicotine dependence, cigarettes, uncomplicated: Secondary | ICD-10-CM | POA: Diagnosis not present

## 2018-07-31 LAB — COMPREHENSIVE METABOLIC PANEL
ALK PHOS: 57 U/L (ref 38–126)
ALT: 15 U/L (ref 0–44)
AST: 28 U/L (ref 15–41)
Albumin: 4.6 g/dL (ref 3.5–5.0)
Anion gap: 12 (ref 5–15)
BILIRUBIN TOTAL: 0.3 mg/dL (ref 0.3–1.2)
BUN: 22 mg/dL — ABNORMAL HIGH (ref 6–20)
CALCIUM: 8.8 mg/dL — AB (ref 8.9–10.3)
CO2: 23 mmol/L (ref 22–32)
Chloride: 106 mmol/L (ref 98–111)
Creatinine, Ser: 1.02 mg/dL — ABNORMAL HIGH (ref 0.44–1.00)
GFR calc Af Amer: >60 mL/min (ref >60)
Glucose, Bld: 117 mg/dL — ABNORMAL HIGH (ref 70–99)
POTASSIUM: 3.7 mmol/L (ref 3.5–5.1)
Sodium: 141 mmol/L (ref 135–145)
TOTAL PROTEIN: 7.7 g/dL (ref 6.5–8.1)

## 2018-07-31 LAB — CBC
HCT: 41.6 % (ref 36.0–46.0)
HEMOGLOBIN: 13 g/dL (ref 12.0–15.0)
MCH: 27.7 pg (ref 26.0–34.0)
MCHC: 31.3 g/dL (ref 30.0–36.0)
MCV: 88.5 fL (ref 80.0–100.0)
Platelets: 241 10*3/uL (ref 150–400)
RBC: 4.7 MIL/uL (ref 3.87–5.11)
RDW: 13.5 % (ref 11.5–15.5)
WBC: 7.3 10*3/uL (ref 4.0–10.5)
nRBC: 0 % (ref 0.0–0.2)

## 2018-07-31 LAB — I-STAT BETA HCG BLOOD, ED (MC, WL, AP ONLY): I-stat hCG, quantitative: <5 m[IU]/mL (ref <5)

## 2018-07-31 LAB — ETHANOL

## 2018-07-31 LAB — SALICYLATE LEVEL: Salicylate Lvl: <7 mg/dL (ref 2.8–30.0)

## 2018-07-31 LAB — ACETAMINOPHEN LEVEL: Acetaminophen (Tylenol), Serum: <10 ug/mL — ABNORMAL LOW (ref 10–30)

## 2018-07-31 MED ORDER — LORAZEPAM 1 MG PO TABS
1.0000 mg | ORAL_TABLET | Freq: Once | ORAL | Status: DC | PRN
  Filled 2018-07-31: qty 1

## 2018-07-31 MED ORDER — TRAZODONE HCL 50 MG PO TABS
50.0000 mg | ORAL_TABLET | Freq: Every evening | ORAL | Status: DC | PRN
  Filled 2018-07-31: qty 1

## 2018-07-31 MED ORDER — HYDROXYZINE HCL 25 MG PO TABS
50.0000 mg | ORAL_TABLET | Freq: Four times a day (QID) | ORAL | Status: DC | PRN
  Filled 2018-07-31: qty 2
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
## 2018-07-31 NOTE — ED Notes (Signed)
ED Provider at bedside. 

## 2018-07-31 NOTE — BH Assessment (Signed)
[facility]  Nira ConnJason Berry, NP recommends inpt treatment. [facility]. TTS to seek placement. EDP Ward, Chase PicketJaime Pilcher, PA-C and triage nurse have been advised.   Princess BruinsAquicha Olia Hinderliter, MSW, LCSW Therapeutic Triage Specialist  [phone]

## 2018-07-31 NOTE — ED Notes (Signed)
Bed: [facility]PT4 Expected date:  Expected time:  Means of arrival:  Comments: 

## 2018-07-31 NOTE — ED Notes (Signed)
Bed: [facility]PT1 Expected date:  Expected time:  Means of arrival:  Comments: 
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
## 2018-07-31 NOTE — ED Notes (Signed)
Pt Care giver [name] [phone]

## 2018-08-01 ENCOUNTER — Emergency Department (HOSPITAL_COMMUNITY): Payer: Medicare Other

## 2018-08-01 DIAGNOSIS — Z049 Encounter for examination and observation for unspecified reason: Secondary | ICD-10-CM | POA: Diagnosis not present

## 2018-08-01 DIAGNOSIS — Z046 Encounter for general psychiatric examination, requested by authority: Secondary | ICD-10-CM | POA: Diagnosis not present

## 2018-08-01 DIAGNOSIS — F200 Paranoid schizophrenia: Secondary | ICD-10-CM | POA: Diagnosis not present

## 2018-08-01 LAB — URINALYSIS, ROUTINE W REFLEX MICROSCOPIC
BACTERIA UA: NONE SEEN
Bilirubin Urine: NEGATIVE
GLUCOSE, UA: NEGATIVE mg/dL
KETONES UR: 5 mg/dL — AB
Leukocytes, UA: NEGATIVE
NITRITE: NEGATIVE
PH: 5 (ref 5.0–8.0)
Protein, ur: NEGATIVE mg/dL
Specific Gravity, Urine: 1.019 (ref 1.005–1.030)

## 2018-08-01 LAB — RAPID URINE DRUG SCREEN, HOSP PERFORMED
AMPHETAMINES: NOT DETECTED
BARBITURATES: NOT DETECTED
BENZODIAZEPINES: NOT DETECTED
Cocaine: NOT DETECTED
Opiates: NOT DETECTED
TETRAHYDROCANNABINOL: NOT DETECTED

## 2018-08-01 MED ORDER — PALIPERIDONE ER 6 MG PO TB24
9.0000 mg | ORAL_TABLET | Freq: Every day | ORAL | Status: DC
  Filled 2018-08-01 (×2): qty 1

## 2018-08-01 MED ORDER — OLANZAPINE 10 MG IM SOLR
5.0000 mg | Freq: Every day | INTRAMUSCULAR | Status: DC
  Administered 2018-08-01: 5 mg via INTRAMUSCULAR
  Administered 2018-08-02: 09:00:00 via INTRAMUSCULAR
  Filled 2018-08-01 (×2): qty 10

## 2018-08-01 MED ORDER — LORAZEPAM 2 MG/ML IJ SOLN
2.0000 mg | Freq: Once | INTRAMUSCULAR | Status: AC
  Administered 2018-08-01: 2 mg via INTRAMUSCULAR
  Filled 2018-08-01: qty 1

## 2018-08-01 MED ORDER — HALOPERIDOL LACTATE 5 MG/ML IJ SOLN
5.0000 mg | Freq: Once | INTRAMUSCULAR | Status: AC
  Administered 2018-08-01: 5 mg via INTRAMUSCULAR
  Filled 2018-08-01: qty 1
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
## 2018-08-01 NOTE — BH Assessment (Addendum)
[facility] Assessment Progress Note  Per Juanetta Beets, DO, this pt requires psychiatric hospitalization at this time.  Pt presents under IVC initiated by pt's sister, [name] A. Thamas Jaegers, who is also pt's legal guardian; and upheld by Dr [name].  The following facilities have been contacted to seek placement for this pt, with results as noted:  Beds available, information sent, decision pending:  [facility]   Declined:  [facility] (due to financial criteria; pt has used all available Medicare days)   At capacity:  [facility] [address] [facility]    [place], [place] Behavioral Health Coordinator [phone]
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
## 2018-08-01 NOTE — ED Notes (Signed)
Pt is calm but is uncooperative.  She refused all procedures today and is not eating or drinking.  She is making urine.  She has been withdrawn to her room and up to bathroom occasionally.

## 2018-08-01 NOTE — ED Provider Notes (Signed)
Pt with paranoid schizophrenia and refusing medications at this time. I don't believe she is mentally well and able to make appropriate decisions regarding her healthcare.  We have offered her oral medicine and she is continuing to refuse. She states she would rather have the injections.  I think it is important and in the patients best interest to force treatment with medications in order to improved her psychiatric state.  I personally evaluated the patient at 11:34 AM on 08/01/18  Dorian Pod, MD 08/01/18 1135

## 2018-08-01 NOTE — ED Notes (Signed)
Patient acutely agitated and very angry-flinging arms at this writer-Jamie Ward EDPA made aware-attempted to give po meds for agitation and patient refused-administered Haldol and Ativan for agitation-VSS-walked back to secure area with security with all patient belongings and IVC paperwork-report to [employer]

## 2018-08-01 NOTE — ED Notes (Signed)
Bed: [facility] Expected date:  Expected time:  Means of arrival:  Comments: [place]

## 2018-08-01 NOTE — ED Notes (Signed)
Pt refused to let us get chest x-ray or EKG

## 2018-08-02 DIAGNOSIS — K5900 Constipation, unspecified: Secondary | ICD-10-CM | POA: Diagnosis not present

## 2018-08-02 DIAGNOSIS — F29 Unspecified psychosis not due to a substance or known physiological condition: Secondary | ICD-10-CM | POA: Diagnosis not present

## 2018-08-02 DIAGNOSIS — R9431 Abnormal electrocardiogram [ECG] [EKG]: Secondary | ICD-10-CM | POA: Diagnosis not present

## 2018-08-02 DIAGNOSIS — F17210 Nicotine dependence, cigarettes, uncomplicated: Secondary | ICD-10-CM | POA: Diagnosis not present

## 2018-08-02 DIAGNOSIS — R509 Fever, unspecified: Secondary | ICD-10-CM | POA: Diagnosis not present

## 2018-08-02 DIAGNOSIS — Z609 Problem related to social environment, unspecified: Secondary | ICD-10-CM | POA: Diagnosis not present

## 2018-08-02 DIAGNOSIS — E559 Vitamin D deficiency, unspecified: Secondary | ICD-10-CM | POA: Diagnosis not present

## 2018-08-02 DIAGNOSIS — Z9111 Patient's noncompliance with dietary regimen: Secondary | ICD-10-CM | POA: Diagnosis not present

## 2018-08-02 DIAGNOSIS — R001 Bradycardia, unspecified: Secondary | ICD-10-CM | POA: Diagnosis not present

## 2018-08-02 DIAGNOSIS — F17200 Nicotine dependence, unspecified, uncomplicated: Secondary | ICD-10-CM | POA: Diagnosis not present

## 2018-08-02 DIAGNOSIS — F209 Schizophrenia, unspecified: Secondary | ICD-10-CM | POA: Diagnosis not present

## 2018-08-02 DIAGNOSIS — Z049 Encounter for examination and observation for unspecified reason: Secondary | ICD-10-CM | POA: Diagnosis not present

## 2018-08-02 DIAGNOSIS — F200 Paranoid schizophrenia: Secondary | ICD-10-CM | POA: Diagnosis not present

## 2018-08-02 DIAGNOSIS — J101 Influenza due to other identified influenza virus with other respiratory manifestations: Secondary | ICD-10-CM | POA: Diagnosis not present

## 2018-08-02 DIAGNOSIS — Z79899 Other long term (current) drug therapy: Secondary | ICD-10-CM | POA: Diagnosis not present

## 2018-08-02 DIAGNOSIS — Z638 Other specified problems related to primary support group: Secondary | ICD-10-CM | POA: Diagnosis not present

## 2018-08-02 DIAGNOSIS — Z758 Other problems related to medical facilities and other health care: Secondary | ICD-10-CM | POA: Diagnosis not present

## 2018-08-02 DIAGNOSIS — Z9119 Patient's noncompliance with other medical treatment and regimen: Secondary | ICD-10-CM | POA: Diagnosis not present

## 2018-08-02 DIAGNOSIS — R7303 Prediabetes: Secondary | ICD-10-CM | POA: Diagnosis not present

## 2018-08-02 DIAGNOSIS — I4581 Long QT syndrome: Secondary | ICD-10-CM | POA: Diagnosis not present

## 2018-08-02 DIAGNOSIS — Z046 Encounter for general psychiatric examination, requested by authority: Secondary | ICD-10-CM | POA: Diagnosis not present

## 2018-08-02 DIAGNOSIS — Z888 Allergy status to other drugs, medicaments and biological substances status: Secondary | ICD-10-CM | POA: Diagnosis not present

## 2018-08-02 DIAGNOSIS — Z720 Tobacco use: Secondary | ICD-10-CM | POA: Diagnosis not present

## 2018-08-02 DIAGNOSIS — I959 Hypotension, unspecified: Secondary | ICD-10-CM | POA: Diagnosis not present

## 2018-08-02 MED ORDER — OLANZAPINE 10 MG IM SOLR: 5.0000 mg | Freq: Two times a day (BID) | INTRAMUSCULAR | Status: DC

## 2018-08-02 MED ORDER — OLANZAPINE 5 MG PO TBDP: 5.0000 mg | ORAL_TABLET | Freq: Two times a day (BID) | ORAL | Status: DC

## 2018-08-02 NOTE — BH Assessment (Signed)
[facility] Assessment Progress Note  Per Juanetta Beets, DO, this pt continues to require psychiatric hospitalization at this time.  Pt remains under IVC initiated by pt's sister, [name] A. Thamas Jaegers, who is also pt's legal guardian, and upheld by Dr [name].  This Clinical research associate spoke to [place] at [facility] [phone]) earlier today, who reports that they intend to offer this pt a bed, but that she will have to call back later with the name of an accepting physician and other details.  As of this writing, return call is pending.  If call is received, please notify [name], the guardian, by calling [phone].  This Clinical research associate has passed off to [employer], CSW.  Doylene Canning, [place] Behavioral Health Coordinator [phone]

## 2018-08-02 NOTE — ED Notes (Signed)
Patient is alert and oriented to person, place and time.  Presents with irritable affect and mood.  Refused all po medication but IM Zyprexa given.  Refused food and fluid intake.  Continues to refused CXR and EKG after several attempts and encouragements.  Routine safety checks maintained every 15 minutes and via security camera.  Observed responding to internal stimuli.  Denies SI/HI.  Patient remain safe on the unit.

## 2018-08-02 NOTE — BH Assessment (Signed)
[facility] Assessment Progress Note  Per Juanetta Beets, DO, this pt requires psychiatric hospitalization at this time.  Pt remains under IVC initiated by pt's sister, [name] A. Thamas Jaegers, who is also pt's legal guardian, and upheld by Dr [name].  At 16:29 Efraim Kaufmann calls from [facility] to report that pt has been accepted to their facility by Dr Joseph Art.  Caryn Bee, PMHNP concurs with this decision.  Pt's nurse, Lanora Manis, has been notified, and agrees to call report to [phone].  Pt is to be transported via [facility].  At 16:32 this Therapist, sports and notified her of pt's disposition, offering contact information for the accepting facility.  I then called Stacy Gardner, CSW, notifying her that disposition for this pt has been completed.  Doylene Canning Behavioral Health Coordinator [phone]

## 2018-08-02 NOTE — ED Notes (Signed)
Has remained asleep all of this writers shift.

## 2018-08-02 NOTE — ED Notes (Signed)
Sheriff called around 4:45pm for transport.  Told they will be here in approximately 4 hours.  Called facility to give report but was asked to call back when patient is on their way.
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
## 2018-08-02 NOTE — ED Notes (Signed)
Pt transferred to [facility] center via sheriff. Pt stable, belongings given to sheriff. Report given to Alvera Novel, RN.

## 2018-08-02 NOTE — ED Notes (Signed)
Pt resting in bed quietly. Pt denies any pain at this time. Pt denies any si,hi, avh at this time. Pt made aware of transfer to another facility. Pt safe, will continue to monitor.

## 2018-09-17 DIAGNOSIS — E559 Vitamin D deficiency, unspecified: Secondary | ICD-10-CM | POA: Diagnosis not present

## 2018-09-17 DIAGNOSIS — F17210 Nicotine dependence, cigarettes, uncomplicated: Secondary | ICD-10-CM | POA: Diagnosis not present

## 2018-09-17 DIAGNOSIS — F209 Schizophrenia, unspecified: Secondary | ICD-10-CM | POA: Diagnosis not present

## 2018-09-17 DIAGNOSIS — Z758 Other problems related to medical facilities and other health care: Secondary | ICD-10-CM | POA: Diagnosis not present

## 2018-09-17 DIAGNOSIS — K5900 Constipation, unspecified: Secondary | ICD-10-CM | POA: Diagnosis not present

## 2018-09-17 DIAGNOSIS — Z638 Other specified problems related to primary support group: Secondary | ICD-10-CM | POA: Diagnosis not present

## 2018-09-17 DIAGNOSIS — R7303 Prediabetes: Secondary | ICD-10-CM | POA: Diagnosis not present

## 2018-09-17 DIAGNOSIS — Z609 Problem related to social environment, unspecified: Secondary | ICD-10-CM | POA: Diagnosis not present

## 2018-09-17 DIAGNOSIS — R9431 Abnormal electrocardiogram [ECG] [EKG]: Secondary | ICD-10-CM | POA: Diagnosis not present

## 2019-10-04 ENCOUNTER — Other Ambulatory Visit: Payer: Self-pay

## 2019-10-04 ENCOUNTER — Emergency Department (HOSPITAL_COMMUNITY)
Admission: EM | Admit: 2019-10-04 | Discharge: 2019-10-09 | Disposition: A | Discharge status: Home / Self Care | Payer: Medicare Other | Attending: Emergency Medicine | Admitting: Emergency Medicine

## 2019-10-04 DIAGNOSIS — F209 Schizophrenia, unspecified: Secondary | ICD-10-CM | POA: Diagnosis not present

## 2019-10-04 DIAGNOSIS — N179 Acute kidney failure, unspecified: Secondary | ICD-10-CM | POA: Insufficient documentation

## 2019-10-04 DIAGNOSIS — F17210 Nicotine dependence, cigarettes, uncomplicated: Secondary | ICD-10-CM | POA: Insufficient documentation

## 2019-10-04 DIAGNOSIS — R9431 Abnormal electrocardiogram [ECG] [EKG]: Secondary | ICD-10-CM | POA: Diagnosis not present

## 2019-10-04 DIAGNOSIS — Z79899 Other long term (current) drug therapy: Secondary | ICD-10-CM | POA: Insufficient documentation

## 2019-10-04 DIAGNOSIS — R456 Violent behavior: Secondary | ICD-10-CM | POA: Diagnosis not present

## 2019-10-04 DIAGNOSIS — Z046 Encounter for general psychiatric examination, requested by authority: Secondary | ICD-10-CM | POA: Diagnosis present

## 2019-10-04 DIAGNOSIS — E860 Dehydration: Secondary | ICD-10-CM | POA: Diagnosis not present

## 2019-10-04 DIAGNOSIS — F200 Paranoid schizophrenia: Secondary | ICD-10-CM | POA: Diagnosis not present

## 2019-10-04 DIAGNOSIS — Z03818 Encounter for observation for suspected exposure to other biological agents ruled out: Secondary | ICD-10-CM | POA: Diagnosis not present

## 2019-10-04 DIAGNOSIS — Z20822 Contact with and (suspected) exposure to covid-19: Secondary | ICD-10-CM | POA: Insufficient documentation

## 2019-10-04 LAB — COMPREHENSIVE METABOLIC PANEL
ALT: 10 U/L (ref 0–44)
AST: 19 U/L (ref 15–41)
Albumin: 4.5 g/dL (ref 3.5–5.0)
Alkaline Phosphatase: 65 U/L (ref 38–126)
Anion gap: 16 — ABNORMAL HIGH (ref 5–15)
BUN: 14 mg/dL (ref 6–20)
CO2: 23 mmol/L (ref 22–32)
Calcium: 9.3 mg/dL (ref 8.9–10.3)
Chloride: 101 mmol/L (ref 98–111)
Creatinine, Ser: 0.98 mg/dL (ref 0.44–1.00)
GFR calc Af Amer: >60 mL/min (ref >60)
GFR calc non Af Amer: >60 mL/min (ref >60)
Glucose, Bld: 136 mg/dL — ABNORMAL HIGH (ref 70–99)
Potassium: 3.5 mmol/L (ref 3.5–5.1)
Sodium: 140 mmol/L (ref 135–145)
Total Bilirubin: 0.3 mg/dL (ref 0.3–1.2)
Total Protein: 7.6 g/dL (ref 6.5–8.1)

## 2019-10-04 LAB — CBC
HCT: 43.6 % (ref 36.0–46.0)
Hemoglobin: 13.9 g/dL (ref 12.0–15.0)
MCH: 28.3 pg (ref 26.0–34.0)
MCHC: 31.9 g/dL (ref 30.0–36.0)
MCV: 88.8 fL (ref 80.0–100.0)
Platelets: 220 10*3/uL (ref 150–400)
RBC: 4.91 MIL/uL (ref 3.87–5.11)
RDW: 12.8 % (ref 11.5–15.5)
WBC: 7.7 10*3/uL (ref 4.0–10.5)
nRBC: 0 % (ref 0.0–0.2)

## 2019-10-04 LAB — ACETAMINOPHEN LEVEL: Acetaminophen (Tylenol), Serum: <10 ug/mL — ABNORMAL LOW (ref 10–30)

## 2019-10-04 LAB — SALICYLATE LEVEL: Salicylate Lvl: <7 mg/dL — ABNORMAL LOW (ref 7.0–30.0)

## 2019-10-04 LAB — RESPIRATORY PANEL BY RT PCR (FLU A&B, COVID)
Influenza A by PCR: NEGATIVE
Influenza B by PCR: NEGATIVE
SARS Coronavirus 2 by RT PCR: NEGATIVE

## 2019-10-04 LAB — ETHANOL: Alcohol, Ethyl (B): <10 mg/dL (ref <10)

## 2019-10-04 MED ORDER — LORAZEPAM 1 MG PO TABS: 1.0000 mg | ORAL_TABLET | Freq: Once | ORAL | Status: DC

## 2019-10-04 MED ORDER — CLONAZEPAM 0.5 MG PO TABS
1.0000 mg | ORAL_TABLET | Freq: Every day | ORAL | Status: DC
  Filled 2019-10-04: qty 2

## 2019-10-04 MED ORDER — LORAZEPAM 2 MG/ML IJ SOLN
2.0000 mg | Freq: Once | INTRAMUSCULAR | Status: AC
  Administered 2019-10-04: 20:00:00 2 mg via INTRAMUSCULAR
  Filled 2019-10-04: qty 1

## 2019-10-04 MED ORDER — HYDROXYZINE HCL 50 MG PO TABS: 50.0000 mg | ORAL_TABLET | Freq: Four times a day (QID) | ORAL | Status: DC | PRN

## 2019-10-04 MED ORDER — OLANZAPINE 10 MG IM SOLR
5.0000 mg | Freq: Once | INTRAMUSCULAR | Status: AC
  Administered 2019-10-04: 5 mg via INTRAMUSCULAR
  Filled 2019-10-04: qty 10

## 2019-10-04 MED ORDER — TRAZODONE HCL 50 MG PO TABS: 50.0000 mg | ORAL_TABLET | Freq: Every evening | ORAL | Status: DC | PRN
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
## 2019-10-04 NOTE — ED Notes (Signed)
Sitter at bedside. Pt in the room sitting on a chair, refusing to lay on bed, and constantly using profanity while talking to herself.

## 2019-10-04 NOTE — [facility] Counselor (Signed)
Per nurse, pt unable to participate in TTS assessment at this time, pt medicated for agitation/aggressiveness. TTS to call back later to assess when pt able to complete assessment.

## 2019-10-04 NOTE — ED Notes (Signed)
Pt placed in bed and cardiac monitor applied and medications given.

## 2019-10-04 NOTE — ED Triage Notes (Signed)
Pt arrives handcuffed with [facility] Deputy IVC'd by her family for being a danger to herself and others, having aggressive conversations with people not in the room, threatening violence to others. Hx paranoid schizophrenia.

## 2019-10-04 NOTE — ED Notes (Signed)
Pt refusing all treatments at this time. Visibly angry and using profanity towards staff members and arguing with herself in her room. Offered PO ativan, pt states "i'm not taking shit, I don't trust anyone, I want to go home." Provider aware.

## 2019-10-04 NOTE — ED Notes (Signed)
Pt refusing all PO medications at this time; MD aware.  

## 2019-10-05 ENCOUNTER — Other Ambulatory Visit: Payer: Self-pay | Admitting: Behavioral Health

## 2019-10-05 MED ORDER — HALOPERIDOL LACTATE 5 MG/ML IJ SOLN
5.0000 mg | Freq: Two times a day (BID) | INTRAMUSCULAR | Status: DC | PRN
  Administered 2019-10-07: 5 mg via INTRAMUSCULAR
  Filled 2019-10-05: qty 1

## 2019-10-05 MED ORDER — PALIPERIDONE ER 3 MG PO TB24
3.0000 mg | ORAL_TABLET | Freq: Every day | ORAL | Status: DC
  Filled 2019-10-05: qty 1

## 2019-10-05 MED ORDER — HALOPERIDOL 5 MG PO TABS: 5.0000 mg | ORAL_TABLET | Freq: Two times a day (BID) | ORAL | Status: DC | PRN

## 2019-10-05 MED ORDER — PALIPERIDONE PALMITATE ER 156 MG/ML IM SUSY
156.0000 mg | PREFILLED_SYRINGE | Freq: Once | INTRAMUSCULAR | Status: AC
  Administered 2019-10-05: 156 mg via INTRAMUSCULAR
  Filled 2019-10-05 (×2): qty 1

## 2019-10-05 NOTE — ED Notes (Addendum)
Pt over to purple zone in stretcher. Pt walked to bed. Cooperative but not happy she is here. States that she wants to go home and that she does not want to stay. Explained to pt that she needs to speak with a counselor and then go from there and they will reevaluate her tomorrow. Offered pt something to eat and drink but pt refused. Belongings brought over to purple by off going RN. Placed in locker.

## 2019-10-05 NOTE — BH Assessment (Signed)
Clinician contacted pt's nurse, Candy RN, to inquire if pt can be aroused to have her [facility] Assessment completed. Pt's nurse stated pt continues to be asleep from the medication that was given to her earlier and cannot participate in the assessment at this time. TTS will attempt at a later time.
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
## 2019-10-05 NOTE — ED Provider Notes (Signed)
  59 year old female with PMH/o Schizophrenia brought in by IVC for concerns of aggressive behavior.  She is refusing all meds.  Behavioral health discussed with me regarding patient status.  She is still under observation for psych clearance but she is refusing all her meds and they want to do a forced med on her.  I personally went and discussed with patient.  Patient will not converse and is only agitated and screaming "I am not to be here, this is all fake and you have no grounds."  She is refusing all meds.   RN instructed to give patient meds.      Portions of this note were generated with Scientist, clinical (histocompatibility and immunogenetics). Dictation errors may occur despite best attempts at proofreading.    Maxwell Caul, PA-C 10/05/19 2256    Derwood Kaplan, MD 10/09/19 1556

## 2019-10-05 NOTE — Progress Notes (Signed)
Patient ID: [name], female   DOB: Feb 08, 1961, 59 y.o.   MRN: [id number]   Medication recommendation/force treatment if required  In brief, [name] is a 59 year old female who was transported to [facility] by [facility] IVC'd by her family for being a danger to herself and others, having aggressive conversations with people not in the room, threatening violence to others. She has a history of paranoid schizophrenia and well documented noncompliance with medications. Patient has been refusing oral medications while in the ED. Chart review  showed that she was on Invega 156 mg/ml Injectable. I discussed case with [facility] psychiatrists and due to her history of paranoid schizophrenia and noncompliance, force treatment with medications maybe required.to improve current mental health state. The following medications are recommended; Invega 156 mg/ml IM Injectable, Invega 3-6 mg po daily, and for agitation Haldol 5 mg BID po or IM. ED provider updated on recommendation. Orders will be placed. EKG reviewed. .   Disposition at this time is overnight observation and patient will be reassessed by psychiatry in the morning.

## 2019-10-05 NOTE — ED Notes (Signed)
Pt hostile during TTS. Pt uncooperative and loud.

## 2019-10-05 NOTE — ED Notes (Signed)
Pt resting comfortably at this time.   Respirations equal and unlabored.

## 2019-10-05 NOTE — ED Notes (Signed)
Breakfast Ordered 

## 2019-10-05 NOTE — ED Notes (Signed)
Pt has not eaten any of the breakfast, lunch, or dinner trays provided for her. Pt says she does not want to eat.

## 2019-10-06 MED ORDER — PALIPERIDONE ER 6 MG PO TB24
6.0000 mg | ORAL_TABLET | Freq: Every day | ORAL | Status: DC
  Filled 2019-10-06 (×5): qty 1

## 2019-10-06 NOTE — ED Notes (Signed)
Pt refusing VS. Pt states she wants to be left along and wants to go home.

## 2019-10-06 NOTE — ED Notes (Signed)
Patient states "pills does not digest right with me" "I told them that earlier today, anything given will have to IM", Patient is lying peacefully in bed and does not appear to be anxious at this time; Pt denies feeling anxious or SI/HI. Pt states "I don't have mental problems I'm just waiting to go home"; Patient offered PRN for anxiety if needed later. Patient agreed to allow sitter to take vital signs with no problems-Monique,RN

## 2019-10-06 NOTE — ED Notes (Signed)
Breakfast ordered 

## 2019-10-06 NOTE — ED Notes (Signed)
After pt saw TTS pt began speaking to herself in her room by herself. She kept stating she was "fine" and "did not need no meds". Pt remained in room.

## 2019-10-06 NOTE — Consult Note (Signed)
  Tele Assessment   [name], 59 y.o., female patient presented to [facility] under IVC on 10/05/2019 by her guardian with complaints of being a danger to self and others.   Patient seen via telepsych by this provider. Patient presents loud, with pressured speech and remains psychotic at this time. She is minimizing all her behaviors and states the government is in her head. She does not make eye contact but continues to fidget at her sheets and picking at her sheets. She is very familiar to [place] here, and received her home medications to include IM injection of haldol 100mg  yesterday.   During evaluation [name] is alert and oriented to self and place.  Patient uncooperative through most of interview and she was irritated and yelling during whole assessment.  Patient continues to be paranoid and delusional referecing the government.  Unable to determine if patient is responding to internal or external stimuli; but patient does appear to have paranoid ideation.  patient denies suicidal/homicidal ideation, psychosis, and paranoia.  Will continue to seek placement for inpatient psychiatric treatment    Recommendations:  Continue to seek inpatient psychiatric treatment and continue current psychotropic mediations. Patient received IM haldol Dec 100mg  yesterday, will need to continue oral medications. Pharmacy started paliperidone 6mg  po daily at this time.    Disposition: Recommend psychiatric Inpatient admission when medically cleared.  Jan, DNP, PMHNP-BC

## 2019-10-06 NOTE — ED Notes (Signed)
Pt sleeping well; observed symetrical rise and fall of chest.

## 2019-10-06 NOTE — ED Notes (Signed)
Lunch ordered 

## 2019-10-06 NOTE — ED Notes (Signed)
Pt ambulatory to restroom with steady gait. Pt calm. Pt offered lunch tray which is at bedside but pt refusing both food and water at this time.  Pt also refusing to take any meds by mouth. Pt states she just wants to go home.

## 2019-10-06 NOTE — ED Notes (Signed)
Pt's dinner ordered °

## 2019-10-06 NOTE — Progress Notes (Signed)
Patient meets criteria for inpatient treatment per Caryn Bee, NP. No appropriate beds at [facility] currently. CSW faxed referrals to the following facilities for review:  [facility]   [facility]   [facility]   [facility]  [facility]   [facility]  [facility]  [facility]   [facility]  [facility]   [facility]  [facility]   [facility]   TTS will continue to seek bed placement.     Ruthann Cancer MSW, [facility] Clincal Social Worker Disposition  [facility] Ph: [phone] Fax: [phone] 10/06/2019 1:47 PM

## 2019-10-07 MED ORDER — LORAZEPAM 2 MG/ML IJ SOLN
2.0000 mg | Freq: Four times a day (QID) | INTRAMUSCULAR | Status: DC | PRN
  Administered 2019-10-07: 2 mg via INTRAMUSCULAR
  Filled 2019-10-07: qty 1

## 2019-10-07 MED ORDER — DIPHENHYDRAMINE HCL 50 MG/ML IJ SOLN
50.0000 mg | Freq: Four times a day (QID) | INTRAMUSCULAR | Status: DC | PRN
  Administered 2019-10-07: 50 mg via INTRAVENOUS
  Filled 2019-10-07: qty 1

## 2019-10-07 NOTE — Progress Notes (Signed)
CSW faxed referral to [facility].  Guinevere Stephenson MSW, LCSWA Clincal Social Worker Disposition  [facility] Ph: [phone] Fax: [phone]  

## 2019-10-07 NOTE — ED Notes (Addendum)
Pt sat on bed - tolerated injections well - given deltoids per pt request - declined to be given in other areas. Pt noted to be talking loudly to herself.

## 2019-10-07 NOTE — ED Notes (Signed)
Pt noted to be sleeping. Sitter leaving at this time - Per [employer], no Comptroller available - J Blue-Matthews, Consulting civil engineer aware.

## 2019-10-07 NOTE — ED Notes (Addendum)
Pt refusing to eat dinner even after much encouragement. States "I just want to go home". States she is not providing urine specimen as "urine goes down the toilet". Also states she does not take po meds - "I take injections and I don't fight about that". Re-TTS being performed.

## 2019-10-07 NOTE — ED Notes (Signed)
New Sitter arrived.

## 2019-10-07 NOTE — ED Notes (Addendum)
BH team aware pt refusing po meds and states she prefers to receive meds via IM.  Requested for route to be changed on meds. Also advised pt refusing to eat - may be d/t paranoia.

## 2019-10-07 NOTE — BH Assessment (Signed)
[facility] Assessment Progress Note   Patient was seen for re-assessment.  Patient is agitated, belligerent and argumentative.  She is refusing medications and not eating.  States that she is going to sue [facility] because she is being held against her will and she states that she is not going to take PO medications.  TTS spoke to Malachy Chamber, DNP and requested orders for IM medications for patient.  Continued Inpatient is recommended

## 2019-10-07 NOTE — ED Notes (Signed)
Urine specimen cup given to Sitter - pt aware of need for specimen.

## 2019-10-07 NOTE — ED Notes (Signed)
Lunch tray ordered 

## 2019-10-07 NOTE — ED Notes (Signed)
Pt continues to be sleeping. Pt has been moving about on bed. Pt awakens easily but then returns to sleeping.

## 2019-10-07 NOTE — ED Notes (Signed)
Pt woke - ambulated to bathroom - pt given urine specimen - pt noted to be yelling/talking loudly while in bathroom - RN could not make out what pt was saying. Pt sat cup on counter when exited bathroom stating she needed another cup "because that one has moth balls on it". Pt returned to room - lying on bed.

## 2019-10-08 NOTE — BHH Counselor (Signed)
Pt remains in the ED under IVC due to aggressive behavior and responding to internal stimuli.  Pt was reassessed this AM.  Pt was very irritable, faced away from camera.  When asked how she felt, Pt yelled that [name] pried too much and that she wanted to go home.  When asked about suicidal ideation, she reported that she does not have mental health issues. Pt showed no insight into current condition.  Recommend continued inpatient.

## 2019-10-08 NOTE — ED Notes (Addendum)
Pt continues to refuse po med and refusing to eat. Pt noted to be sleeping on bed w/eyes closed. Declined shower offered earlier. Pt also refusing to provide urine specimen.

## 2019-10-08 NOTE — ED Notes (Signed)
Patient offered meds but refused; pt also refused snacks or fluid; pt states "I just want to go home". Pt is calm and lying in bed at this time-Monique,RN

## 2019-10-08 NOTE — Progress Notes (Signed)
Pt was denied placement at [facility] per Selena Batten w/ admissions.  Ruthann Cancer MSW, LCSWA Clincal Social Worker Disposition  [facility] Ph: [phone] Fax: [phone]

## 2019-10-08 NOTE — Progress Notes (Signed)
Pt continues to meet inpatient criteria per Hillery Jacks, NP. CSW faxed and re-faxed referrals to the following facilities:   [facility]- [facility]- [facility]- [facility]- [facility]- [facility]  [facility]   Ruthann Cancer MSW, LCSWA Clincal Social Worker Disposition  [facility] Ph: [phone] Fax: [phone]

## 2019-10-08 NOTE — ED Notes (Signed)
IVC-INPT [place]  Breakfast ordered

## 2019-10-08 NOTE — ED Notes (Addendum)
Pt refusing to eat/drink even after much encouragement. States "I don't want to eat. I just want to go home".

## 2019-10-09 LAB — COMPREHENSIVE METABOLIC PANEL
ALT: 13 U/L (ref 0–44)
AST: 21 U/L (ref 15–41)
Albumin: 4.4 g/dL (ref 3.5–5.0)
Alkaline Phosphatase: 66 U/L (ref 38–126)
Anion gap: 22 — ABNORMAL HIGH (ref 5–15)
BUN: 39 mg/dL — ABNORMAL HIGH (ref 6–20)
CO2: 16 mmol/L — ABNORMAL LOW (ref 22–32)
Calcium: 9.9 mg/dL (ref 8.9–10.3)
Chloride: 108 mmol/L (ref 98–111)
Creatinine, Ser: 1.48 mg/dL — ABNORMAL HIGH (ref 0.44–1.00)
GFR calc Af Amer: 45 mL/min — ABNORMAL LOW (ref >60)
GFR calc non Af Amer: 39 mL/min — ABNORMAL LOW (ref >60)
Glucose, Bld: 110 mg/dL — ABNORMAL HIGH (ref 70–99)
Potassium: 4.7 mmol/L (ref 3.5–5.1)
Sodium: 146 mmol/L — ABNORMAL HIGH (ref 135–145)
Total Bilirubin: 1.3 mg/dL — ABNORMAL HIGH (ref 0.3–1.2)
Total Protein: 8.1 g/dL (ref 6.5–8.1)

## 2019-10-09 LAB — CBC WITH DIFFERENTIAL/PLATELET
Abs Immature Granulocytes: 0.01 10*3/uL (ref 0.00–0.07)
Basophils Absolute: 0 10*3/uL (ref 0.0–0.1)
Basophils Relative: 0 %
Eosinophils Absolute: 0 10*3/uL (ref 0.0–0.5)
Eosinophils Relative: 0 %
HCT: 51.1 % — ABNORMAL HIGH (ref 36.0–46.0)
Hemoglobin: 16.5 g/dL — ABNORMAL HIGH (ref 12.0–15.0)
Immature Granulocytes: 0 %
Lymphocytes Relative: 13 %
Lymphs Abs: 0.7 10*3/uL (ref 0.7–4.0)
MCH: 28.3 pg (ref 26.0–34.0)
MCHC: 32.3 g/dL (ref 30.0–36.0)
MCV: 87.5 fL (ref 80.0–100.0)
Monocytes Absolute: 0.4 10*3/uL (ref 0.1–1.0)
Monocytes Relative: 8 %
Neutro Abs: 3.9 10*3/uL (ref 1.7–7.7)
Neutrophils Relative %: 79 %
Platelets: 249 10*3/uL (ref 150–400)
RBC: 5.84 MIL/uL — ABNORMAL HIGH (ref 3.87–5.11)
RDW: 13.3 % (ref 11.5–15.5)
WBC: 4.9 10*3/uL (ref 4.0–10.5)
nRBC: 0 % (ref 0.0–0.2)

## 2019-10-09 LAB — URINALYSIS, ROUTINE W REFLEX MICROSCOPIC
Bacteria, UA: NONE SEEN
Bilirubin Urine: NEGATIVE
Glucose, UA: NEGATIVE mg/dL
Ketones, ur: 80 mg/dL — AB
Leukocytes,Ua: NEGATIVE
Nitrite: NEGATIVE
Protein, ur: 100 mg/dL — AB
Specific Gravity, Urine: 1.028 (ref 1.005–1.030)
pH: 6 (ref 5.0–8.0)

## 2019-10-09 MED ORDER — DEXTROSE-NACL 5-0.45 % IV SOLN: INTRAVENOUS | Status: DC

## 2019-10-09 NOTE — ED Provider Notes (Signed)
Called to bedside for patient assessment.  Pt refusing to eat and drink.  She has dry lips.  States that she will not eat or drink.  Denies any pain.  She wishes to go home.  Will encourage oral intake, check labs.   Labs consistent with AKI, elevation and BUN and creatinine compared to baseline. She does have an elevated anion gap and low bicarb, consistent with ketosis. She was treated with IV fluid hydration. Patient refuses oral fluids in the emergency department but states that she will drink fluids if she is discharged home. She has been cleared from a psychiatric standpoint after stabilization with medications. Psychiatry recommends discharge. Her IVC was rescinded. Plan to discharge in care of her guardian with ongoing oral fluid hydration at home.   Tilden Fossa, MD 10/09/19 1537

## 2019-10-09 NOTE — ED Notes (Signed)
Pt refused to eat breakfast or to drink anything. Pt was offered water and stated that she wanted to go home.

## 2019-10-09 NOTE — Discharge Instructions (Addendum)
Follow up outpatient as needed. Read attachments.    Return to ED or seek immediate medical attention for any new or worsening symptoms.

## 2019-10-09 NOTE — Progress Notes (Addendum)
Patient ID: [name], female   DOB: 07/20/1961, 59 y.o.   MRN: [id number]   Psychiatric reassessment   In brief; [name] is an 59 y.o. female with history of Schizophrenia who presented to [facility] 10/04/2019 under IVC taken out by her guardian/sister [name]) [phone] following reports of non-compliance with medication  And increased aggressive behaviors. During this evaluation, she is alert and oriented x3, irritable although cooperative. Patient is known to [facility] as previously noted, she has a history of Schizophrenia and she been psychiatrically hospitalized here at [facility] in the past. It is well documented that when patient becomes noncompliant with medications, she becomes agressive and her family has to initiate an IVC for safety and stabilization. She at this time, denies SI, HI or AVH. Per patients nurse, she does not appear psychotic and psychosis is noto observed during my evaluation. Per nursing patient has been refusing to eat and drink and patients acknowledges this stating she is refusing to eat and drink because she wants to go home and she does not like the food being provided in the ED.  While in the ED, staff has noted that patient has periods of irritability although without aggression.  On 3/18, I discussed patients care with psychiatrists and Invega 156 mg/ml IM Injectable along with Invega 3-6 mg po daily was recommended. Patient received the dose of  Invega 156 mg/ml IM Injectable on 10/05/2019 although per ED nurse, she hs been refusing to take the p.o (by mouth) medication. We discussed this and she reported that she does not want to take the medication by mouth but is willing to continue the injection. She is currently on the [facility]  ACT team who will be assisting her with community services and medication management.  I spoke with patients guardian [name] [phone]) who stated that her main concerns were patients noncompliance with medications which led  to aggression and increased irritability. She was advised that patient was back on the Invega injectable and at this point, patient stated she was willing to continue this medication following discharge and follow-up with her [facility] Act team. Guardian stated  that the ACT team comes out at least 3 times per week. She was advised that based of this evaluation, patient would be psychiatrically cleared. She voiced no safety concerns and stated because she was at work, she would not be able to pick patient up until 5:00 this afternoon. I made an attempt to reach out to patients ACT team, [phone] to discuss continued aftercare although attempt was unsuccessful. Voice message was left for a return phone call.   Disposition: Patient denies SI, HI or psychosis. She does not appear internally preoccupied. She was restarted on Invega while in the ED and  Invega 156 mg/ml IM Injectable was administered 10/05/2019. It is recommended that patient continue to follow-up with her ACT team and other psychiatric outpatient services along with her continuous of her psychotropic medications. Patient guardian was advised that If the patient's symptoms worsen or do not continue to improve or if the patient becomes actively suicidal or homicidal then it is recommended that the patient return to the closest hospital emergency room or call 911 for further evaluation and treatment.  National Suicide Prevention Lifeline 1800-SUICIDE or (970) 104-1936.Family was educated about removing/locking any firearms, medications or dangerous products from the home. Wrier discussed patients case with [facility] psychiatrists following evaluation and prior to disposition.   EDP updated on disposition/psychiatric clearance. Marland Kitchen

## 2019-10-09 NOTE — ED Notes (Signed)
RN introduced self and offered her something to drink noting her mouth and lips are very dry. She states "I dont want nothing. I want to go home. Im being forced here. Just leave me alone."

## 2020-02-29 ENCOUNTER — Encounter (HOSPITAL_COMMUNITY): Payer: Self-pay

## 2020-02-29 ENCOUNTER — Emergency Department (HOSPITAL_COMMUNITY)
Admission: EM | Admit: 2020-02-29 | Discharge: 2020-03-01 | Disposition: A | Discharge status: Other Acute Inpatient Hospital | Payer: Medicare Other | Source: Home / Self Care | Attending: Emergency Medicine | Admitting: Emergency Medicine

## 2020-02-29 ENCOUNTER — Other Ambulatory Visit: Payer: Self-pay

## 2020-02-29 DIAGNOSIS — R58 Hemorrhage, not elsewhere classified: Secondary | ICD-10-CM | POA: Diagnosis not present

## 2020-02-29 DIAGNOSIS — R4689 Other symptoms and signs involving appearance and behavior: Secondary | ICD-10-CM | POA: Insufficient documentation

## 2020-02-29 DIAGNOSIS — E860 Dehydration: Secondary | ICD-10-CM | POA: Diagnosis not present

## 2020-02-29 DIAGNOSIS — S199XXA Unspecified injury of neck, initial encounter: Secondary | ICD-10-CM | POA: Diagnosis not present

## 2020-02-29 DIAGNOSIS — N179 Acute kidney failure, unspecified: Secondary | ICD-10-CM | POA: Diagnosis not present

## 2020-02-29 DIAGNOSIS — S0990XA Unspecified injury of head, initial encounter: Secondary | ICD-10-CM | POA: Diagnosis not present

## 2020-02-29 DIAGNOSIS — S01111A Laceration without foreign body of right eyelid and periocular area, initial encounter: Secondary | ICD-10-CM | POA: Diagnosis not present

## 2020-02-29 DIAGNOSIS — Z20822 Contact with and (suspected) exposure to covid-19: Secondary | ICD-10-CM | POA: Diagnosis present

## 2020-02-29 DIAGNOSIS — Y92009 Unspecified place in unspecified non-institutional (private) residence as the place of occurrence of the external cause: Secondary | ICD-10-CM | POA: Diagnosis not present

## 2020-02-29 DIAGNOSIS — Z7982 Long term (current) use of aspirin: Secondary | ICD-10-CM | POA: Insufficient documentation

## 2020-02-29 DIAGNOSIS — Z886 Allergy status to analgesic agent status: Secondary | ICD-10-CM | POA: Diagnosis not present

## 2020-02-29 DIAGNOSIS — Z79899 Other long term (current) drug therapy: Secondary | ICD-10-CM | POA: Insufficient documentation

## 2020-02-29 DIAGNOSIS — E162 Hypoglycemia, unspecified: Secondary | ICD-10-CM | POA: Diagnosis not present

## 2020-02-29 DIAGNOSIS — G4700 Insomnia, unspecified: Secondary | ICD-10-CM | POA: Diagnosis present

## 2020-02-29 DIAGNOSIS — D649 Anemia, unspecified: Secondary | ICD-10-CM | POA: Diagnosis not present

## 2020-02-29 DIAGNOSIS — R630 Anorexia: Secondary | ICD-10-CM | POA: Diagnosis not present

## 2020-02-29 DIAGNOSIS — Z888 Allergy status to other drugs, medicaments and biological substances status: Secondary | ICD-10-CM | POA: Diagnosis not present

## 2020-02-29 DIAGNOSIS — R251 Tremor, unspecified: Secondary | ICD-10-CM | POA: Diagnosis present

## 2020-02-29 DIAGNOSIS — R456 Violent behavior: Secondary | ICD-10-CM | POA: Diagnosis not present

## 2020-02-29 DIAGNOSIS — R440 Auditory hallucinations: Secondary | ICD-10-CM | POA: Insufficient documentation

## 2020-02-29 DIAGNOSIS — E440 Moderate protein-calorie malnutrition: Secondary | ICD-10-CM | POA: Diagnosis not present

## 2020-02-29 DIAGNOSIS — F29 Unspecified psychosis not due to a substance or known physiological condition: Secondary | ICD-10-CM | POA: Diagnosis not present

## 2020-02-29 DIAGNOSIS — Z9114 Patient's other noncompliance with medication regimen: Secondary | ICD-10-CM | POA: Diagnosis not present

## 2020-02-29 DIAGNOSIS — E876 Hypokalemia: Secondary | ICD-10-CM | POA: Diagnosis not present

## 2020-02-29 DIAGNOSIS — W19XXXA Unspecified fall, initial encounter: Secondary | ICD-10-CM | POA: Diagnosis not present

## 2020-02-29 DIAGNOSIS — F200 Paranoid schizophrenia: Secondary | ICD-10-CM | POA: Diagnosis not present

## 2020-02-29 DIAGNOSIS — F209 Schizophrenia, unspecified: Secondary | ICD-10-CM | POA: Diagnosis not present

## 2020-02-29 DIAGNOSIS — I959 Hypotension, unspecified: Secondary | ICD-10-CM | POA: Diagnosis not present

## 2020-02-29 DIAGNOSIS — I10 Essential (primary) hypertension: Secondary | ICD-10-CM | POA: Diagnosis not present

## 2020-02-29 DIAGNOSIS — Z681 Body mass index (BMI) 19 or less, adult: Secondary | ICD-10-CM | POA: Diagnosis not present

## 2020-02-29 DIAGNOSIS — R55 Syncope and collapse: Secondary | ICD-10-CM | POA: Diagnosis not present

## 2020-02-29 DIAGNOSIS — Z9111 Patient's noncompliance with dietary regimen: Secondary | ICD-10-CM | POA: Diagnosis not present

## 2020-02-29 DIAGNOSIS — Z8659 Personal history of other mental and behavioral disorders: Secondary | ICD-10-CM | POA: Diagnosis not present

## 2020-02-29 DIAGNOSIS — F17210 Nicotine dependence, cigarettes, uncomplicated: Secondary | ICD-10-CM | POA: Diagnosis not present

## 2020-02-29 LAB — CBC WITH DIFFERENTIAL/PLATELET
Abs Immature Granulocytes: 0.01 10*3/uL (ref 0.00–0.07)
Basophils Absolute: 0 10*3/uL (ref 0.0–0.1)
Basophils Relative: 0 %
Eosinophils Absolute: 0 10*3/uL (ref 0.0–0.5)
Eosinophils Relative: 0 %
HCT: 42.2 % (ref 36.0–46.0)
Hemoglobin: 13.4 g/dL (ref 12.0–15.0)
Immature Granulocytes: 0 %
Lymphocytes Relative: 13 %
Lymphs Abs: 1 10*3/uL (ref 0.7–4.0)
MCH: 28.2 pg (ref 26.0–34.0)
MCHC: 31.8 g/dL (ref 30.0–36.0)
MCV: 88.8 fL (ref 80.0–100.0)
Monocytes Absolute: 0.4 10*3/uL (ref 0.1–1.0)
Monocytes Relative: 5 %
Neutro Abs: 5.9 10*3/uL (ref 1.7–7.7)
Neutrophils Relative %: 82 %
Platelets: 211 10*3/uL (ref 150–400)
RBC: 4.75 MIL/uL (ref 3.87–5.11)
RDW: 13.1 % (ref 11.5–15.5)
WBC: 7.3 10*3/uL (ref 4.0–10.5)
nRBC: 0 % (ref 0.0–0.2)

## 2020-02-29 LAB — COMPREHENSIVE METABOLIC PANEL
ALT: 12 U/L (ref 0–44)
AST: 25 U/L (ref 15–41)
Albumin: 4.6 g/dL (ref 3.5–5.0)
Alkaline Phosphatase: 65 U/L (ref 38–126)
Anion gap: 9 (ref 5–15)
BUN: 11 mg/dL (ref 6–20)
CO2: 26 mmol/L (ref 22–32)
Calcium: 8.9 mg/dL (ref 8.9–10.3)
Chloride: 103 mmol/L (ref 98–111)
Creatinine, Ser: 0.87 mg/dL (ref 0.44–1.00)
GFR calc Af Amer: >60 mL/min (ref >60)
GFR calc non Af Amer: >60 mL/min (ref >60)
Glucose, Bld: 116 mg/dL — ABNORMAL HIGH (ref 70–99)
Potassium: 3.9 mmol/L (ref 3.5–5.1)
Sodium: 138 mmol/L (ref 135–145)
Total Bilirubin: 0.5 mg/dL (ref 0.3–1.2)
Total Protein: 7.7 g/dL (ref 6.5–8.1)

## 2020-02-29 LAB — ETHANOL: Alcohol, Ethyl (B): <10 mg/dL (ref <10)

## 2020-02-29 LAB — I-STAT BETA HCG BLOOD, ED (MC, WL, AP ONLY): I-stat hCG, quantitative: <5 m[IU]/mL (ref <5)

## 2020-02-29 LAB — SARS CORONAVIRUS 2 BY RT PCR (HOSPITAL ORDER, PERFORMED IN ~~LOC~~ HOSPITAL LAB): SARS Coronavirus 2: NEGATIVE

## 2020-02-29 MED ORDER — HALOPERIDOL LACTATE 5 MG/ML IJ SOLN: 5.0000 mg | Freq: Once | INTRAMUSCULAR | Status: DC

## 2020-02-29 MED ORDER — LORAZEPAM 2 MG/ML IJ SOLN: 2.0000 mg | Freq: Once | INTRAMUSCULAR | Status: DC
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
## 2020-02-29 NOTE — ED Notes (Signed)
Pt continues to refuse vitals signs being taken

## 2020-02-29 NOTE — ED Notes (Signed)
Pt refused vitals at this time.

## 2020-02-29 NOTE — ED Triage Notes (Signed)
Pt BIB by Sheriff's Department. Pt IVC'd due to auditory and visual hallucinations. Pt has hx of schizophrenia. Pt verbally aggressive towards auditory hallucinations. Pt is a risk to self and others.

## 2020-02-29 NOTE — ED Notes (Signed)
Pt refused vital signs.

## 2020-02-29 NOTE — ED Notes (Signed)
Pt refusing vitals.

## 2020-02-29 NOTE — ED Notes (Signed)
Pt has 1 pt belonging bag located in locker 31, [address]

## 2020-02-29 NOTE — ED Provider Notes (Signed)
[name] Provider Note   CSN: [id number] Arrival date & time: 02/29/20  1406     History Chief Complaint  Patient presents with  . Mental Health Problem    [name] is a 59 y.o. female.  Patient with history of schizophrenia.  IVC filled as patient acutely manic.  Not taking medications.  Appears to be actively having auditory hallucinations and being aggressive.  The history is provided by the patient and the police.  Mental Health Problem Presenting symptoms: aggressive behavior, disorganized thought process and hallucinations   Patient accompanied by:  Law enforcement Degree of incapacity (severity):  Moderate Onset quality:  Gradual Timing:  Constant Progression:  Unchanged Chronicity:  Recurrent Context: noncompliance   Relieved by:  Nothing Worsened by:  Nothing Associated symptoms: no abdominal pain and no chest pain   Risk factors: hx of mental illness        Past Medical History:  Diagnosis Date  . Non compliance w medication regimen   . Schizophrenia [name])     Patient Active Problem List   Diagnosis Date Noted  . Schizophrenia (HCC) 11/26/2017  . Noncompliance with diet and medication regimen   . Psychoses (HCC)   . Hypokalemic alkalosis 11/11/2011  . Medically noncompliant 11/11/2011  . Schizophrenia, paranoid (HCC) 11/07/2011    History reviewed. No pertinent surgical history.   OB History   No obstetric history on file.     History reviewed. No pertinent family history.  Social History   Tobacco Use  . Smoking status: Current Some Day Smoker    Types: Cigarettes  . Smokeless tobacco: Never Used  Vaping Use  . Vaping Use: Every day  Substance Use Topics  . Alcohol use: Yes    Comment: Refuses to disclose how much  . Drug use: No    Comment: Refuses to answer    Home Medications Prior to Admission medications   Medication Sig Start Date End Date Taking? Authorizing Provider  haloperidol  (HALDOL) 10 MG tablet Take 10 mg by mouth at bedtime.    [provider]  haloperidol decanoate (HALDOL DECANOATE) 100 MG/ML injection Inject 100 mg into the muscle every 30 (thirty) days. 08/30/19   [provider]  paliperidone (INVEGA SUSTENNA) 156 MG/ML SUSY injection Inject 1 mL (156 mg total) into the muscle every 30 (thirty) days. (Due on 01-03-18): For mood control 01/03/18   Paula Stammer I, NP    Allergies    Aspirin and Geodon [ziprasidone hcl]  Review of Systems   Review of Systems  Constitutional: Negative for chills and fever.  HENT: Negative for ear pain and sore throat.   Eyes: Negative for pain and visual disturbance.  Respiratory: Negative for cough and shortness of breath.   Cardiovascular: Negative for chest pain and palpitations.  Gastrointestinal: Negative for abdominal pain and vomiting.  Genitourinary: Negative for dysuria and hematuria.  Musculoskeletal: Negative for arthralgias and back pain.  Skin: Negative for color change and rash.  Neurological: Negative for seizures and syncope.  Psychiatric/Behavioral: Positive for hallucinations.  All other systems reviewed and are negative.   Physical Exam Updated Vital Signs BP 130/68 (BP Location: Left Arm)   Pulse 67   Temp 98.2 F (36.8 C) (Oral)   Resp 18   SpO2 100%   Physical Exam Vitals and nursing note reviewed.  Constitutional:      General: She is not in acute distress.    Appearance: She is well-developed. She is not ill-appearing.  HENT:     Head: Normocephalic and atraumatic.     Nose: Nose normal.     Mouth/Throat:     Mouth: Mucous membranes are moist.  Eyes:     Extraocular Movements: Extraocular movements intact.     Conjunctiva/sclera: Conjunctivae normal.     Pupils: Pupils are equal, round, and reactive to light.  Cardiovascular:     Rate and Rhythm: Normal rate and regular rhythm.     Pulses: Normal pulses.     Heart sounds: Normal heart sounds. No murmur heard.     Pulmonary:     Effort: Pulmonary effort is normal. No respiratory distress.     Breath sounds: Normal breath sounds.  Abdominal:     Palpations: Abdomen is soft.     Tenderness: There is no abdominal tenderness.  Musculoskeletal:     Cervical back: Normal range of motion and neck supple.  Skin:    General: Skin is warm and dry.     Capillary Refill: Capillary refill takes less than 2 seconds.  Neurological:     General: No focal deficit present.     Mental Status: She is alert.  Psychiatric:        Attention and Perception: She is inattentive. She perceives auditory hallucinations.        Mood and Affect: Affect is labile.        Behavior: Behavior is aggressive.        Thought Content: Thought content does not include homicidal or suicidal ideation.     ED Results / Procedures / Treatments   Labs (all labs ordered are listed, but only abnormal results are displayed) Labs Reviewed  SARS CORONAVIRUS 2 BY RT PCR (HOSPITAL ORDER, PERFORMED IN [name])  CBC WITH DIFFERENTIAL/PLATELET  COMPREHENSIVE METABOLIC PANEL  ETHANOL  RAPID URINE DRUG SCREEN, HOSP PERFORMED  [name]-STAT BETA HCG BLOOD, ED ([facility], [facility], [facility] ONLY)    EKG None  Radiology No results found.  Procedures Procedures (including critical care time)  Medications Ordered in ED Medications  haloperidol lactate (HALDOL) injection 5 mg (has no administration in time range)  LORazepam (ATIVAN) injection 2 mg (has no administration in time range)    ED Course  [name] have reviewed the triage vital signs and the nursing notes.  Pertinent labs & imaging results that were available during my care of the patient were reviewed by me and considered in my medical decision making (see chart for details).    MDM Rules/Calculators/A&P                          [name] is a 59 year old female with history of schizophrenia who presents with IVC.  Normal vitals.  No fever.  Appears to be having a manic episode.   Does not appear to be compliant with medications.  Has been aggressive appears to be talking to someone who is not in the room and hallucinating.  [name] work unremarkable.  Medically cleared and will have her evaluated by psychiatry.  This chart was dictated using voice recognition software.  Despite best efforts to proofread,  errors can occur which can change the documentation meaning.     Final Clinical Impression(s) / ED Diagnoses Final diagnoses:  Psychosis, unspecified psychosis type (HCC)  Aggressive behavior    Rx / DC Orders ED Discharge Orders    None       Virgina Norfolk, DO 02/29/20 1615

## 2020-02-29 NOTE — ED Notes (Signed)
Pt continues to be irritable. Pt continues to talk to self and have conversation and angry.

## 2020-03-01 ENCOUNTER — Other Ambulatory Visit: Payer: Self-pay

## 2020-03-01 ENCOUNTER — Inpatient Hospital Stay (HOSPITAL_COMMUNITY)
Admission: AD | Admit: 2020-03-01 | Discharge: 2020-03-14 | DRG: 885 | Disposition: A | Discharge status: Other Acute Inpatient Hospital | Payer: Medicare Other | Source: Intra-hospital | Attending: Emergency Medicine | Admitting: Emergency Medicine

## 2020-03-01 ENCOUNTER — Encounter (HOSPITAL_COMMUNITY): Payer: Self-pay

## 2020-03-01 DIAGNOSIS — F209 Schizophrenia, unspecified: Secondary | ICD-10-CM

## 2020-03-01 DIAGNOSIS — Z9114 Patient's other noncompliance with medication regimen: Secondary | ICD-10-CM

## 2020-03-01 DIAGNOSIS — G4700 Insomnia, unspecified: Secondary | ICD-10-CM | POA: Diagnosis present

## 2020-03-01 DIAGNOSIS — S199XXA Unspecified injury of neck, initial encounter: Secondary | ICD-10-CM | POA: Diagnosis not present

## 2020-03-01 DIAGNOSIS — N179 Acute kidney failure, unspecified: Secondary | ICD-10-CM | POA: Diagnosis not present

## 2020-03-01 DIAGNOSIS — D649 Anemia, unspecified: Secondary | ICD-10-CM | POA: Diagnosis not present

## 2020-03-01 DIAGNOSIS — E162 Hypoglycemia, unspecified: Secondary | ICD-10-CM

## 2020-03-01 DIAGNOSIS — R58 Hemorrhage, not elsewhere classified: Secondary | ICD-10-CM | POA: Diagnosis not present

## 2020-03-01 DIAGNOSIS — Z886 Allergy status to analgesic agent status: Secondary | ICD-10-CM | POA: Diagnosis not present

## 2020-03-01 DIAGNOSIS — Z888 Allergy status to other drugs, medicaments and biological substances status: Secondary | ICD-10-CM

## 2020-03-01 DIAGNOSIS — S0990XA Unspecified injury of head, initial encounter: Secondary | ICD-10-CM | POA: Diagnosis not present

## 2020-03-01 DIAGNOSIS — R4689 Other symptoms and signs involving appearance and behavior: Secondary | ICD-10-CM | POA: Diagnosis not present

## 2020-03-01 DIAGNOSIS — I10 Essential (primary) hypertension: Secondary | ICD-10-CM | POA: Diagnosis not present

## 2020-03-01 DIAGNOSIS — R251 Tremor, unspecified: Secondary | ICD-10-CM | POA: Diagnosis present

## 2020-03-01 DIAGNOSIS — E860 Dehydration: Secondary | ICD-10-CM | POA: Diagnosis present

## 2020-03-01 DIAGNOSIS — F200 Paranoid schizophrenia: Secondary | ICD-10-CM | POA: Diagnosis present

## 2020-03-01 DIAGNOSIS — Z20822 Contact with and (suspected) exposure to covid-19: Secondary | ICD-10-CM | POA: Diagnosis present

## 2020-03-01 DIAGNOSIS — Z8659 Personal history of other mental and behavioral disorders: Secondary | ICD-10-CM | POA: Diagnosis not present

## 2020-03-01 DIAGNOSIS — S01111A Laceration without foreign body of right eyelid and periocular area, initial encounter: Secondary | ICD-10-CM | POA: Diagnosis not present

## 2020-03-01 DIAGNOSIS — R630 Anorexia: Secondary | ICD-10-CM | POA: Diagnosis not present

## 2020-03-01 DIAGNOSIS — Y92009 Unspecified place in unspecified non-institutional (private) residence as the place of occurrence of the external cause: Secondary | ICD-10-CM | POA: Diagnosis not present

## 2020-03-01 DIAGNOSIS — R531 Weakness: Secondary | ICD-10-CM | POA: Diagnosis present

## 2020-03-01 DIAGNOSIS — W19XXXA Unspecified fall, initial encounter: Secondary | ICD-10-CM | POA: Diagnosis not present

## 2020-03-01 DIAGNOSIS — Z9111 Patient's noncompliance with dietary regimen: Secondary | ICD-10-CM | POA: Diagnosis not present

## 2020-03-01 DIAGNOSIS — R55 Syncope and collapse: Secondary | ICD-10-CM | POA: Diagnosis not present

## 2020-03-01 DIAGNOSIS — E876 Hypokalemia: Secondary | ICD-10-CM | POA: Diagnosis not present

## 2020-03-01 DIAGNOSIS — I959 Hypotension, unspecified: Secondary | ICD-10-CM | POA: Diagnosis not present

## 2020-03-01 DIAGNOSIS — E440 Moderate protein-calorie malnutrition: Secondary | ICD-10-CM | POA: Diagnosis not present

## 2020-03-01 MED ORDER — ACETAMINOPHEN 325 MG PO TABS: 650.0000 mg | ORAL_TABLET | Freq: Four times a day (QID) | ORAL | Status: DC | PRN

## 2020-03-01 MED ORDER — HALOPERIDOL 5 MG PO TABS
5.0000 mg | ORAL_TABLET | Freq: Two times a day (BID) | ORAL | Status: DC
  Filled 2020-03-01: qty 1

## 2020-03-01 MED ORDER — MAGNESIUM HYDROXIDE 400 MG/5ML PO SUSP: 30.0000 mL | Freq: Every day | ORAL | Status: DC | PRN

## 2020-03-01 MED ORDER — HYDROXYZINE HCL 25 MG PO TABS: 25.0000 mg | ORAL_TABLET | Freq: Three times a day (TID) | ORAL | Status: DC | PRN

## 2020-03-01 MED ORDER — BENZTROPINE MESYLATE 1 MG/ML IJ SOLN
0.5000 mg | Freq: Two times a day (BID) | INTRAMUSCULAR | Status: DC
  Administered 2020-03-01: 0.5 mg via INTRAMUSCULAR
  Filled 2020-03-01: qty 2

## 2020-03-01 MED ORDER — HALOPERIDOL 5 MG PO TABS: 5.0000 mg | ORAL_TABLET | Freq: Two times a day (BID) | ORAL | Status: DC

## 2020-03-01 MED ORDER — ALUM & MAG HYDROXIDE-SIMETH 200-200-20 MG/5ML PO SUSP: 30.0000 mL | ORAL | Status: DC | PRN

## 2020-03-01 MED ORDER — HALOPERIDOL LACTATE 5 MG/ML IJ SOLN
5.0000 mg | Freq: Two times a day (BID) | INTRAMUSCULAR | Status: DC | PRN
  Administered 2020-03-01: 5 mg via INTRAMUSCULAR
  Filled 2020-03-01: qty 1

## 2020-03-01 MED ORDER — HALOPERIDOL LACTATE 5 MG/ML IJ SOLN
5.0000 mg | Freq: Two times a day (BID) | INTRAMUSCULAR | Status: DC
  Administered 2020-03-01: 5 mg via INTRAMUSCULAR
  Filled 2020-03-01: qty 1

## 2020-03-01 MED ORDER — HALOPERIDOL 5 MG PO TABS: 5.0000 mg | ORAL_TABLET | Freq: Two times a day (BID) | ORAL | Status: DC | PRN

## 2020-03-01 MED ORDER — RISPERIDONE 2 MG PO TBDP
3.0000 mg | ORAL_TABLET | Freq: Two times a day (BID) | ORAL | Status: DC
  Filled 2020-03-01 (×2): qty 1

## 2020-03-01 MED ORDER — BENZTROPINE MESYLATE 1 MG/ML IJ SOLN
0.5000 mg | Freq: Two times a day (BID) | INTRAMUSCULAR | Status: DC
  Administered 2020-03-01 – 2020-03-07 (×10): 0.5 mg via INTRAMUSCULAR
  Filled 2020-03-01 (×5): qty 0.5
  Filled 2020-03-01: qty 2
  Filled 2020-03-01 (×9): qty 0.5

## 2020-03-01 MED ORDER — TRAZODONE HCL 100 MG PO TABS: 50.0000 mg | ORAL_TABLET | Freq: Every evening | ORAL | Status: DC | PRN

## 2020-03-01 MED ORDER — BENZTROPINE MESYLATE 0.5 MG PO TABS
0.5000 mg | ORAL_TABLET | Freq: Two times a day (BID) | ORAL | Status: DC
  Filled 2020-03-01 (×15): qty 1

## 2020-03-01 MED ORDER — BENZTROPINE MESYLATE 0.5 MG PO TABS
0.5000 mg | ORAL_TABLET | Freq: Two times a day (BID) | ORAL | Status: DC
  Filled 2020-03-01 (×3): qty 1

## 2020-03-01 MED ORDER — HALOPERIDOL LACTATE 5 MG/ML IJ SOLN: 5.0000 mg | Freq: Two times a day (BID) | INTRAMUSCULAR | Status: DC

## 2020-03-01 NOTE — ED Notes (Signed)
Patient will not allow [place] to get her vitals.

## 2020-03-01 NOTE — ED Notes (Signed)
IM medication was given with security present.  Pt is angry but took it without difficulty.

## 2020-03-01 NOTE — ED Notes (Signed)
Patient still sitting on side of bed arms crossed lights on head bowed.
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
## 2020-03-01 NOTE — ED Notes (Signed)
Patient sitting quietly on side of bed with arms crossed head bent down talking to herself

## 2020-03-01 NOTE — ED Notes (Signed)
Patient refused her lunch. Patient is quietly sitting on side of bed/arms crossed across chest with head bent forward.

## 2020-03-01 NOTE — ED Notes (Signed)
Patient quiet in room. Occasionally standing still in one place with arms crossed to sitting on side of bed

## 2020-03-01 NOTE — Tx Team (Signed)
Initial Treatment Plan 03/01/2020 5:55 PM [name] NOI:[id number]    PATIENT STRESSORS: Health problems Marital or family conflict   PATIENT STRENGTHS: Supportive family/friends   PATIENT IDENTIFIED PROBLEMS: Psychosis  Paranoia  Delusional thinking                 DISCHARGE CRITERIA:  Improved stabilization in mood, thinking, and/or behavior  PRELIMINARY DISCHARGE PLAN: Outpatient therapy Return to previous living arrangement  PATIENT/FAMILY INVOLVEMENT: This treatment plan has been presented to and reviewed with the patient, [name].  The patient has been given the opportunity to ask questions and make suggestions.  Garnette Scheuermann, RN 03/01/2020, 5:55 PM

## 2020-03-01 NOTE — ED Notes (Signed)
Patient is still sitting on side of bed/ arms crossed head bent over with occasionally talking to self.

## 2020-03-01 NOTE — Progress Notes (Signed)
Pt refused her PO medications , so pt was given IM medication per MAR. Pt accepted medication without incident.     03/01/20 2200  Psych Admission Type (Psych Patients Only)  Admission Status Involuntary  Psychosocial Assessment  Patient Complaints Suspiciousness  Eye Contact Avertive  Facial Expression Sad  Affect Preoccupied  Speech Incoherent  Interaction Isolative  Motor Activity Slow  Appearance/Hygiene Disheveled;Body odor  Behavior Characteristics Guarded;Resistant to care  Mood Preoccupied  Aggressive Behavior  Effect No apparent injury  Thought Process  Coherency Tangential;Loose associations  Content Preoccupation  Delusions None reported or observed  Perception Hallucinations  Hallucination Auditory;Visual  Judgment Poor  Confusion Moderate  Danger to Self  Current suicidal ideation? Denies  Danger to Others  Danger to Others None reported or observed

## 2020-03-01 NOTE — BH Assessment (Addendum)
[facility] Assessment Progress Note  Per Berneice Heinrich, NP, this pt requires psychiatric hospitalization.  Percell Boston, RN has assigned pt to [facility] Rm [id number]; [facility] will be ready to receive pt at 15:00.  Pt presents under IVC initiated by pt's sister/legal guardian [name] [phone]), and upheld by EDP Virgina Norfolk, DO, and IVC documents have been faxed to [facility]  Pt's nurse, Kendal Hymen, has been notified, and agrees to call report to [phone].  Pt is to be transported via Patent [employer].   Doylene Canning, [place] Behavioral Health Coordinator [phone]   Addendum:  At 14:03 this writer called [facility] to notify her of pt's disposition.  Doylene Canning, [place] Behavioral Health Coordinator [phone]
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
## 2020-03-01 NOTE — ED Notes (Signed)
Patient will not take PO medication.  She does not want medicine and states " I am not hear for medical treatment."  She became very angry.

## 2020-03-01 NOTE — ED Notes (Signed)
Patient sitting on side of bed arms crossed with head bent down.

## 2020-03-02 DIAGNOSIS — F200 Paranoid schizophrenia: Principal | ICD-10-CM

## 2020-03-02 LAB — URINALYSIS, COMPLETE (UACMP) WITH MICROSCOPIC
Bacteria, UA: NONE SEEN
Bilirubin Urine: NEGATIVE
Glucose, UA: NEGATIVE mg/dL
Ketones, ur: 80 mg/dL — AB
Nitrite: NEGATIVE
Protein, ur: 30 mg/dL — AB
Specific Gravity, Urine: 1.026 (ref 1.005–1.030)
pH: 5 (ref 5.0–8.0)

## 2020-03-02 LAB — TSH: TSH: 1.908 u[IU]/mL (ref 0.350–4.500)

## 2020-03-02 MED ORDER — PALIPERIDONE ER 6 MG PO TB24
6.0000 mg | ORAL_TABLET | Freq: Every day | ORAL | Status: DC
  Filled 2020-03-02 (×14): qty 1

## 2020-03-02 MED ORDER — PALIPERIDONE PALMITATE ER 234 MG/1.5ML IM SUSY
234.0000 mg | PREFILLED_SYRINGE | Freq: Once | INTRAMUSCULAR | Status: AC
  Administered 2020-03-02: 234 mg via INTRAMUSCULAR
  Filled 2020-03-02: qty 1.5

## 2020-03-02 NOTE — [facility] Group Notes (Signed)
LCSW Group Therapy Note  03/02/2020   10:00-11:00am   Type of Therapy and Topic:  Group Therapy: Anger Cues and Responses  Participation Level:  Did Not Attend   Description of Group:   In this group, patients learned how to recognize the physical, cognitive, emotional, and behavioral responses they have to anger-provoking situations.  They identified a recent time they became angry and how they reacted.  They analyzed how their reaction was possibly beneficial and how it was possibly unhelpful.  The group discussed a variety of healthier coping skills that could help with such a situation in the future.  Focus was placed on how helpful it is to recognize the underlying emotions to our anger, because working on those can lead to a more permanent solution as well as our ability to focus on the important rather than the urgent.  Therapeutic Goals: 1. Patients will remember their last incident of anger and how they felt emotionally and physically, what their thoughts were at the time, and how they behaved. 2. Patients will identify how their behavior at that time worked for them, as well as how it worked against them. 3. Patients will explore possible new behaviors to use in future anger situations. 4. Patients will learn that anger itself is normal and cannot be eliminated, and that healthier reactions can assist with resolving conflict rather than worsening situations.  Summary of Patient Progress:  The patient did not attend. Therapeutic Modalities:   Cognitive Behavioral Therapy  Alyjah Lovingood D Shenise Wolgamott    
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
## 2020-03-02 NOTE — Progress Notes (Signed)
Adult Psychoeducational Group Note  Date:  03/02/2020 Time:  12:52 AM  Group Topic/Focus:  Wrap-Up Group:   The focus of this group is to help patients review their daily goal of treatment and discuss progress on daily workbooks.  Participation Level:  Did Not Attend  Participation Quality:  Did Not Attend  Affect:  Did Not Attend  Cognitive:  Did Not Attend  Insight: None  Engagement in Group:  Did Not Attend  Modes of Intervention:  Did Not Attend  Additional Comments:  Pt did not attend evening wrap up group tonight.  Felipa Furnace 03/02/2020, 12:52 AM

## 2020-03-02 NOTE — H&P (Signed)
Psychiatric Admission Assessment Adult  Patient Identification: [name] MRN:  [id number] Date of Evaluation:  03/02/2020 Chief Complaint:  Paranoid schizophrenia (HCC) [F20.0] Principal Diagnosis: <principal problem not specified> Diagnosis:  Active Problems:   Paranoid schizophrenia (HCC)  History of Present Illness: Patient is seen and examined.  Patient is a 59 year old female with a past psychiatric history significant for schizophrenia who presented to the [facility] emergency department on 02/29/2020 brought by the sheriff's office under involuntary commitment.  She was placed under involuntary commitment by the report that she was experiencing auditory as well as visual hallucinations and was violent.  Unfortunately patient was a poor historian but the early information was that she had been off her psychiatric medications for approximately 2 years.  Review of the electronic medical record revealed that she had previously been prescribed oral Haldol, the long-acting Haldol Decanoate injection which she reportedly received on 08/30/2019, and had also been treated with the long-acting paliperidone injection last on 01/03/2018.  She has a longstanding history of schizophrenia, vitamin D deficiency, and paranoia induced anorexia.  On evaluation this morning she is clearly paranoid.  She stated she wants to prepare her own food because she knows that it would be poisoned or harmful to her.  She refuses to talk about any of her medications, and refuses to take any p.o. nutrition.  The last psychiatric admission that she was at our facility apparently was in 03/2015.  She was hospitalized for 3 weeks secondary to schizophrenia.  Her discharge medications at that time included Tegretol 300 mg p.o. twice daily and the long-acting paliperidone injection.  She refused oral medications last night.  And ended up receiving intramuscular Haldol.  She had been written for Risperdal because in the  past she had refused Haldol, so today we have switched her to oral paliperidone and we will give her the long-acting paliperidone injection this morning.  She was admitted to the hospital for evaluation and stabilization.  Associated Signs/Symptoms: Depression Symptoms:  depressed mood, anhedonia, insomnia, psychomotor agitation, fatigue, difficulty concentrating, hopelessness, anxiety, loss of energy/fatigue, disturbed sleep, (Hypo) Manic Symptoms:  Delusions, Hallucinations, Impulsivity, Irritable Mood, Anxiety Symptoms:  Excessive Worry, Psychotic Symptoms:  Delusions, Hallucinations: Auditory Paranoia, PTSD Symptoms: Negative Total Time spent with patient: 30 minutes  Past Psychiatric History: Patient has a longstanding history of schizophrenia.  As best as I am able to find through the electronic medical record her last psychiatric hospitalization was apparently in January 2020.  She was discharged on oral haloperidol and Haldol Decanoate.  She had previously received the long-acting paliperidone injection.  Is the patient at risk to self? Yes.    Has the patient been a risk to self in the past 6 months? No.  Has the patient been a risk to self within the distant past? Yes.    Is the patient a risk to others? No.  Has the patient been a risk to others in the past 6 months? No.  Has the patient been a risk to others within the distant past? No.   Prior Inpatient Therapy:   Prior Outpatient Therapy:    Alcohol Screening:   Substance Abuse History in the last 12 months:  No. Consequences of Substance Abuse: Negative Previous Psychotropic Medications: Yes  Psychological Evaluations: Yes  Past Medical History:  Past Medical History:  Diagnosis Date  . Non compliance w medication regimen   . Schizophrenia (HCC)    History reviewed. No pertinent surgical history. Family History: History reviewed.  No pertinent family history. Family Psychiatric  History:  Unknown Tobacco Screening:   Social History:  Social History   Substance and Sexual Activity  Alcohol Use Yes   Comment: Refuses to disclose how much     Social History   Substance and Sexual Activity  Drug Use No   Comment: Refuses to answer    Additional Social History:                           Allergies:   Allergies  Allergen Reactions  . Aspirin Other (See Comments)    Possibly caused hives per sister  . Geodon [Ziprasidone Hcl] Other (See Comments)    Caused twitching   Lab Results:  Results for orders placed or performed during the hospital encounter of 02/29/20 (from the past 48 hour(s))  SARS Coronavirus 2 by RT PCR (hospital order, performed in [facility] hospital lab) Nasopharyngeal Nasopharyngeal Swab     Status: None   Collection Time: 02/29/20  3:01 PM   Specimen: Nasopharyngeal Swab  Result Value Ref Range   SARS Coronavirus 2 NEGATIVE NEGATIVE    Comment: (NOTE) SARS-CoV-2 target nucleic acids are NOT DETECTED.  The SARS-CoV-2 RNA is generally detectable in upper and lower respiratory specimens during the acute phase of infection. The lowest concentration of SARS-CoV-2 viral copies this assay can detect is 250 copies / mL. A negative result does not preclude SARS-CoV-2 infection and should not be used as the sole basis for treatment or other patient management decisions.  A negative result may occur with improper specimen collection / handling, submission of specimen other than nasopharyngeal swab, presence of viral mutation(s) within the areas targeted by this assay, and inadequate number of viral copies (<250 copies / mL). A negative result must be combined with clinical observations, patient history, and epidemiological information.  Fact Sheet for Patients:   BoilerBrush.com.cy  Fact Sheet for Healthcare Providers: https://pope.com/  This test is not yet approved or  cleared by the  [place] FDA and has been authorized for detection and/or diagnosis of SARS-CoV-2 by FDA under an Emergency Use Authorization (EUA).  This EUA will remain in effect (meaning this test can be used) for the duration of the COVID-19 declaration under Section 564(b)(1) of the Act, 21 U.S.C. section 360bbb-3(b)(1), unless the authorization is terminated or revoked sooner.  Performed at [facility], [address], [place], [place] [address]   Comprehensive metabolic panel     Status: Abnormal   Collection Time: 02/29/20  3:37 PM  Result Value Ref Range   Sodium 138 135 - 145 mmol/L   Potassium 3.9 3.5 - 5.1 mmol/L   Chloride 103 98 - 111 mmol/L   CO2 26 22 - 32 mmol/L   Glucose, Bld 116 (H) 70 - 99 mg/dL    Comment: Glucose reference range applies only to samples taken after fasting for at least 8 hours.   BUN 11 6 - 20 mg/dL   Creatinine, Ser 3.47 0.44 - 1.00 mg/dL   Calcium 8.9 8.9 - 42.5 mg/dL   Total Protein 7.7 6.5 - 8.1 g/dL   Albumin 4.6 3.5 - 5.0 g/dL   AST 25 15 - 41 U/L   ALT 12 0 - 44 U/L   Alkaline Phosphatase 65 38 - 126 U/L   Total Bilirubin 0.5 0.3 - 1.2 mg/dL   GFR calc non Af Amer >60 >60 mL/min   GFR calc Af Amer >60 >  60 mL/min   Anion gap 9 5 - 15    Comment: Performed at [facility], [address], [place], [place] [address]  Ethanol     Status: None   Collection Time: 02/29/20  3:37 PM  Result Value Ref Range   Alcohol, Ethyl (B) <10 <10 mg/dL    Comment: (NOTE) Lowest detectable limit for serum alcohol is 10 mg/dL.  For medical purposes only. Performed at [facility], [address] [place], [place] [address]   CBC with Diff     Status: None   Collection Time: 02/29/20  3:37 PM  Result Value Ref Range   WBC 7.3 4.0 - 10.5 K/uL   RBC 4.75 3.87 - 5.11 MIL/uL   Hemoglobin 13.4 12.0 - 15.0 g/dL   HCT 47.4 36 - 46 %   MCV 88.8 80.0 - 100.0 fL   MCH 28.2 26.0 - 34.0 pg   MCHC 31.8 30.0 -  36.0 g/dL   RDW 25.9 56.3 - 87.5 %   Platelets 211 150 - 400 K/uL   nRBC 0.0 0.0 - 0.2 %   Neutrophils Relative % 82 %   Neutro Abs 5.9 1.7 - 7.7 K/uL   Lymphocytes Relative 13 %   Lymphs Abs 1.0 0.7 - 4.0 K/uL   Monocytes Relative 5 %   Monocytes Absolute 0.4 0 - 1 K/uL   Eosinophils Relative 0 %   Eosinophils Absolute 0.0 0 - 0 K/uL   Basophils Relative 0 %   Basophils Absolute 0.0 0 - 0 K/uL   Immature Granulocytes 0 %   Abs Immature Granulocytes 0.01 0.00 - 0.07 K/uL    Comment: Performed at [facility], [address] [place], [place] [address]  I-Stat beta hCG blood, ED     Status: None   Collection Time: 02/29/20  3:45 PM  Result Value Ref Range   I-stat hCG, quantitative <5.0 <5 mIU/mL   Comment 3            Comment:   GEST. AGE      CONC.  (mIU/mL)   <=1 WEEK        5 - 50     2 WEEKS       50 - 500     3 WEEKS       100 - 10,000     4 WEEKS     1,000 - 30,000        FEMALE AND NON-PREGNANT FEMALE:     LESS THAN 5 mIU/mL     Blood Alcohol level:  Lab Results  Component Value Date   ETH <10 02/29/2020   ETH <10 10/04/2019    Metabolic Disorder Labs:  Lab Results  Component Value Date   HGBA1C 6.1 (H) 04/16/2015   MPG 128 04/16/2015   Lab Results  Component Value Date   PROLACTIN 13.6 04/16/2015   Lab Results  Component Value Date   CHOL 186 04/16/2015   TRIG 64 04/16/2015   HDL 55 04/16/2015   CHOLHDL 3.4 04/16/2015   VLDL 13 04/16/2015   LDLCALC 118 (H) 04/16/2015    Current Medications: Current Facility-Administered Medications  Medication Dose Route Frequency Provider Last Rate Last Admin  . acetaminophen (TYLENOL) tablet 650 mg  650 mg Oral Q6H PRN Antonieta Pert, MD      . alum & mag hydroxide-simeth (MAALOX/MYLANTA) 200-200-20 MG/5ML suspension 30 mL  30 mL Oral Q4H PRN Antonieta Pert, MD      .  benztropine (COGENTIN) tablet 0.5 mg  0.5 mg Oral BID Antonieta Pertlary, Sundiata Ferrick Lawson, MD       Or  . benztropine mesylate  (COGENTIN) injection 0.5 mg  0.5 mg Intramuscular BID Antonieta Pertlary, Panda Crossin Lawson, MD   0.5 mg at 03/02/20 0859  . haloperidol (HALDOL) tablet 5 mg  5 mg Oral BID PRN Antonieta Pertlary, Avram Danielson Lawson, MD       Or  . haloperidol lactate (HALDOL) injection 5 mg  5 mg Intramuscular BID PRN Antonieta Pertlary, Demonte Dobratz Lawson, MD   5 mg at 03/01/20 2032  . hydrOXYzine (ATARAX/VISTARIL) tablet 25 mg  25 mg Oral TID PRN Antonieta Pertlary, Wynona Duhamel Lawson, MD      . magnesium hydroxide (MILK OF MAGNESIA) suspension 30 mL  30 mL Oral Daily PRN Antonieta Pertlary, Monish Haliburton Lawson, MD      . paliperidone (INVEGA) 24 hr tablet 6 mg  6 mg Oral Daily Antonieta Pertlary, Satrina Magallanes Lawson, MD      . traZODone (DESYREL) tablet 50 mg  50 mg Oral QHS PRN Antonieta Pertlary, Arul Farabee Lawson, MD       PTA Medications: No medications prior to admission.    Musculoskeletal: Strength & Muscle Tone: decreased Gait & Station: normal Patient leans: [name]  Psychiatric Specialty Exam: Physical Exam Vitals and nursing note reviewed.  HENT:     Head: Normocephalic and atraumatic.  Pulmonary:     Effort: Pulmonary effort is normal.  Neurological:     General: No focal deficit present.     Mental Status: She is alert.     Review of Systems  Blood pressure 95/66, pulse 75, temperature 98.3 F (36.8 C), temperature source Oral.There is no height or weight on file to calculate BMI.  General Appearance: Disheveled  Eye Contact:  Fair  Speech:  Normal Rate  Volume:  Increased  Mood:  Anxious, Dysphoric and Irritable  Affect:  Labile  Thought Process:  Goal Directed and Descriptions of Associations: Circumstantial  Orientation:  Negative  Thought Content:  Delusions, Hallucinations: Auditory and Paranoid Ideation  Suicidal Thoughts:  No  Homicidal Thoughts:  No  Memory:  Immediate;   Poor Recent;   Poor Remote;   Poor  Judgement:  Impaired  Insight:  Lacking  Psychomotor Activity:  Increased  Concentration:  Concentration: Fair and Attention Span: Fair  Recall:  Poor  Fund of Knowledge:  Poor  Language:  Fair   Akathisia:  Negative  Handed:  Right  AIMS (if indicated):     Assets:  Desire for Improvement Resilience  ADL's:  Impaired  Cognition:  WNL  Sleep:  Number of Hours: 6.75    Treatment Plan Summary: Daily contact with patient to assess and evaluate symptoms and progress in treatment, Medication management and Plan : Patient is seen and examined.  Patient is a 59 year old female with the above-stated past psychiatric history who is admitted secondary to worsening psychosis.  She will be admitted to the hospital.  She will be integrated in the milieu.  She will be encouraged to attend groups.  As stated above we attempted to give her Resporal that was ineffective.  What medication she has received was intramuscular Haldol.  She remains significantly paranoid.  We will change her oral medications to paliperidone.  We will attempt to give her 3 mg now and 6 mg p.o. nightly.  We will go on and give her the long-acting paliperidone injection 234 mg x 1 today.  Hopefully we can get her headed in the right direction.  Given her history of  lability we will also attempt to give her the Tegretol, but we will start at 100 mg p.o. twice daily.  Nursing staff who are familiar with the patient stated that she will often be anorexic of food and liquid for several days, and in the past she has had to be transferred to the emergency department for IV fluids.  Hopefully we can convince her to take minimal nutrition until the medications become effective.  Review of her admission laboratories revealed essentially normal electrolytes including a creatinine of 0.87.  Her liver function enzymes were normal.  Her CBC was normal.  Her differential was normal.  That is at least encouraging.  Her beta-hCG was negative, blood alcohol was less than 10, but we do not have an EKG on her, and we do not have a TSH on her, and these will be ordered today.  Currently her vital signs are stable.  She has low normal blood pressure 95/66,  pulse is 75, she is afebrile.  Observation Level/Precautions:  15 minute checks  Laboratory:  Chemistry Profile  Psychotherapy:    Medications:    Consultations:    Discharge Concerns:    Estimated LOS:  Other:     Physician Treatment Plan for Primary Diagnosis: <principal problem not specified> Long Term Goal(s): Improvement in symptoms so as ready for discharge  Short Term Goals: Ability to identify changes in lifestyle to reduce recurrence of condition will improve, Ability to verbalize feelings will improve, Ability to demonstrate self-control will improve, Ability to identify and develop effective coping behaviors will improve, Ability to maintain clinical measurements within normal limits will improve and Compliance with prescribed medications will improve  Physician Treatment Plan for Secondary Diagnosis: Active Problems:   Paranoid schizophrenia (HCC)  Long Term Goal(s): Improvement in symptoms so as ready for discharge  Short Term Goals: Ability to identify changes in lifestyle to reduce recurrence of condition will improve, Ability to verbalize feelings will improve, Ability to demonstrate self-control will improve, Ability to identify and develop effective coping behaviors will improve, Ability to maintain clinical measurements within normal limits will improve and Compliance with prescribed medications will improve  I certify that inpatient services furnished can reasonably be expected to improve the patient's condition.    Antonieta Pert, MD 8/14/202111:34 AM

## 2020-03-02 NOTE — Progress Notes (Signed)
Pt has presented the same as she has done in the past, pt refused to eat and drink anything this evening like she did previous admission. Pt had to be sent to the hospital for fluids during last admission due to pt refusing to eat / drink.

## 2020-03-02 NOTE — BHH Suicide Risk Assessment (Signed)
[facility] Admission Suicide Risk Assessment   Nursing information obtained from:  Patient Demographic factors:  Low socioeconomic status, Unemployed Current Mental Status:  NA Loss Factors:  NA Historical Factors:  Impulsivity Risk Reduction Factors:  Positive social support  Total Time spent with patient: 30 minutes Principal Problem: <principal problem not specified> Diagnosis:  Active Problems:   Paranoid schizophrenia (HCC)  Subjective Data: Patient is seen and examined.  Patient is a 59 year old female with a past psychiatric history significant for schizophrenia who presented to the [facility] emergency department on 02/29/2020 brought by the sheriff's office under involuntary commitment.  She was placed under involuntary commitment by the report that she was experiencing auditory as well as visual hallucinations and was violent.  Unfortunately patient was a poor historian but the early information was that she had been off her psychiatric medications for approximately 2 years.  Review of the electronic medical record revealed that she had previously been prescribed oral Haldol, the long-acting Haldol Decanoate injection which she reportedly received on 08/30/2019, and had also been treated with the long-acting paliperidone injection last on 01/03/2018.  She has a longstanding history of schizophrenia, vitamin D deficiency, and paranoia induced anorexia.  On evaluation this morning she is clearly paranoid.  She stated she wants to prepare her own food because she knows that it would be poisoned or harmful to her.  She refuses to talk about any of her medications, and refuses to take any p.o. nutrition.  The last psychiatric admission that she was at our facility apparently was in 03/2015.  She was hospitalized for 3 weeks secondary to schizophrenia.  Her discharge medications at that time included Tegretol 300 mg p.o. twice daily and the long-acting paliperidone injection.  She refused  oral medications last night.  And ended up receiving intramuscular Haldol.  She had been written for Risperdal because in the past she had refused Haldol, so today we have switched her to oral paliperidone and we will give her the long-acting paliperidone injection this morning.  She was admitted to the hospital for evaluation and stabilization.  Continued Clinical Symptoms:    The "Alcohol Use Disorders Identification Test", Guidelines for Use in Primary Care, Second Edition.  World Science writer [name]). Score between 0-7:  no or low risk or alcohol related problems. Score between 8-15:  moderate risk of alcohol related problems. Score between 16-19:  high risk of alcohol related problems. Score 20 or above:  warrants further diagnostic evaluation for alcohol dependence and treatment.   CLINICAL FACTORS:   Schizophrenia:   Paranoid or undifferentiated type   Musculoskeletal: Strength & Muscle Tone: decreased Gait & Station: normal Patient leans: [name]  Psychiatric Specialty Exam: Physical Exam Vitals and nursing note reviewed.  HENT:     Head: Normocephalic and atraumatic.  Pulmonary:     Effort: Pulmonary effort is normal.  Neurological:     General: No focal deficit present.     Mental Status: She is alert and oriented to person, place, and time.     Review of Systems  Blood pressure 95/66, pulse 75, temperature 98.3 F (36.8 C), temperature source Oral.There is no height or weight on file to calculate BMI.  General Appearance: Disheveled  Eye Contact:  Fair  Speech:  Normal Rate  Volume:  Increased  Mood:  Anxious, Dysphoric and Irritable  Affect:  Labile  Thought Process:  Goal Directed and Descriptions of Associations: Loose  Orientation:  Negative  Thought Content:  Delusions and  Paranoid Ideation  Suicidal Thoughts:  No  Homicidal Thoughts:  No  Memory:  Immediate;   Poor Recent;   Poor Remote;   Poor  Judgement:  Impaired  Insight:  Lacking  Psychomotor  Activity:  Normal  Concentration:  Concentration: Fair and Attention Span: Fair  Recall:  [employer] of Knowledge:  Fair  Language:  Good  Akathisia:  Negative  Handed:  Right  AIMS (if indicated):     Assets:  Desire for Improvement Resilience  ADL's:  Impaired  Cognition:  WNL  Sleep:  Number of Hours: 6.75      COGNITIVE FEATURES THAT CONTRIBUTE TO RISK:  Closed-mindedness    SUICIDE RISK:   Mild:  Suicidal ideation of limited frequency, intensity, duration, and specificity.  There are no identifiable plans, no associated intent, mild dysphoria and related symptoms, good self-control (both objective and subjective assessment), few other risk factors, and identifiable protective factors, including available and accessible social support.  PLAN OF CARE: Patient is seen and examined.  Patient is a 59 year old female with the above-stated past psychiatric history who is admitted secondary to worsening psychosis.  She will be admitted to the hospital.  She will be integrated in the milieu.  She will be encouraged to attend groups.  As stated above we attempted to give her Resporal that was ineffective.  What medication she has received was intramuscular Haldol.  She remains significantly paranoid.  We will change her oral medications to paliperidone.  We will attempt to give her 3 mg now and 6 mg p.o. nightly.  We will go on and give her the long-acting paliperidone injection 234 mg x 1 today.  Hopefully we can get her headed in the right direction.  Given her history of lability we will also attempt to give her the Tegretol, but we will start at 100 mg p.o. twice daily.  Nursing staff who are familiar with the patient stated that she will often be anorexic of food and liquid for several days, and in the past she has had to be transferred to the emergency department for IV fluids.  Hopefully we can convince her to take minimal nutrition until the medications become effective.  Review of her  admission laboratories revealed essentially normal electrolytes including a creatinine of 0.87.  Her liver function enzymes were normal.  Her CBC was normal.  Her differential was normal.  That is at least encouraging.  Her beta-hCG was negative, blood alcohol was less than 10, but we do not have an EKG on her, and we do not have a TSH on her, and these will be ordered today.  Currently her vital signs are stable.  She has low normal blood pressure 95/66, pulse is 75, she is afebrile.  I certify that inpatient services furnished can reasonably be expected to improve the patient's condition.   Antonieta Pert, MD 03/02/2020, 9:38 AM

## 2020-03-02 NOTE — BHH Group Notes (Signed)
Adult Psychoeducational Group Note  Date:  03/02/2020 Time:  12:15 PM  Group Topic/Focus:  Goals Group:   The focus of this group is to help patients establish daily goals to achieve during treatment and discuss how the patient can incorporate goal setting into their daily lives to aide in recovery.  Participation Level:  Minimal  Participation Quality:  Attentive  Affect:  Flat  Cognitive: limited  Insight: Lacking  Engagement in Group:  Engaged  Modes of Intervention:  Discussion, Education, Exploration and Support  Additional Comments:  Pt rates her energy level at a 9/10. Energy level and behavior incongruent. States the reason she is here is "entrapment". States her goal is to attned the groups. Sharee Pimple, Clela Hagadorn A 03/02/2020, 12:15 PM

## 2020-03-03 DIAGNOSIS — F200 Paranoid schizophrenia: Secondary | ICD-10-CM | POA: Diagnosis not present

## 2020-03-03 MED ORDER — HALOPERIDOL 5 MG PO TABS
5.0000 mg | ORAL_TABLET | Freq: Two times a day (BID) | ORAL | Status: DC
  Filled 2020-03-03 (×4): qty 1

## 2020-03-03 MED ORDER — HALOPERIDOL LACTATE 5 MG/ML IJ SOLN
5.0000 mg | Freq: Two times a day (BID) | INTRAMUSCULAR | Status: DC
  Administered 2020-03-03 – 2020-03-04 (×2): 5 mg via INTRAMUSCULAR
  Filled 2020-03-03 (×4): qty 1

## 2020-03-03 MED ORDER — PALIPERIDONE PALMITATE ER 234 MG/1.5ML IM SUSY: 234.0000 mg | PREFILLED_SYRINGE | Freq: Once | INTRAMUSCULAR | Status: DC

## 2020-03-03 NOTE — [facility] Group Notes (Signed)
[facility] LCSW Group Therapy Note  Date/Time:  03/03/2020  11:00AM-12:00PM  Type of Therapy and Topic:  Group Therapy:  Music and Mood  Participation Level:  None   Description of Group: In this process group, members listened to a variety of genres of music and identified that different types of music evoke different responses.  Patients were encouraged to identify music that was soothing for them and music that was energizing for them.  Patients discussed how this knowledge can help with wellness and recovery in various ways including managing depression and anxiety as well as encouraging healthy sleep habits.    Therapeutic Goals: 1. Patients will explore the impact of different varieties of music on mood 2. Patients will verbalize the thoughts they have when listening to different types of music 3. Patients will identify music that is soothing to them as well as music that is energizing to them 4. Patients will discuss how to use this knowledge to assist in maintaining wellness and recovery 5. Patients will explore the use of music as a coping skill  Summary of Patient Progress:   The patient stayed for the entire group said nothing asked questions. When prompted to engaged she declined. Therapeutic Modalities: Solution Focused Brief Therapy Activity   Ambrose Mantle, LCSW

## 2020-03-03 NOTE — Plan of Care (Signed)
Progress note  D: pt found in bedroom; noncompliant with morning medication administration. Pt continues to be agitated and angry regarding their admission, stating they don't need to be here. Pt continues to refuse water and nutrition. Pt is dominating and defensive in their assessment. Pt is guarded with most questioning. Pt is pleasant though. Pt denies any physical complaints or pain. Pt denies si/hi/ah/vh and verbally agrees to approach staff if these become apparent or before harming themself/others while at [facility].  A: Pt provided support and encouragement. Pt given medication per protocol and standing orders. Q10m safety checks implemented and continued.  R: Pt safe on the unit. Will continue to monitor.  Pt progressing in the following metrics  Problem: Activity: Goal: Sleeping patterns will improve Outcome: Progressing   Problem: Physical Regulation: Goal: Ability to maintain clinical measurements within normal limits will improve Outcome: Progressing   Problem: Safety: Goal: Periods of time without injury will increase Outcome: Progressing

## 2020-03-03 NOTE — Progress Notes (Signed)
[facility] MD Progress Note  03/03/2020 11:38 AM [name]  MRN:  [id number] Subjective: Patient is a 59 year old female with a past psychiatric history significant for schizophrenia who was admitted on 03/02/2020 secondary to worsening psychotic symptoms and noncompliance with her schizophrenic medications.  Objective: Patient is seen and examined.  Patient is a 59 year old female with the above-stated past psychiatric history who is seen in follow-up.  She is essentially unchanged from yesterday.  She remains paranoid of multiple factors including food and water.  Nursing stated that she is basically taking nothing orally.  She did receive some Haldol yesterday as well as the long-acting paliperidone injection.  They have not given her the Haldol today.  Her blood pressure is 97/62.  Pulse is 85.  Respirations are 18.  She is afebrile.  Nursing notes reflect that she slept 6.75 hours last night.  No new laboratories but on 8/12 and 8/13 essentially all of her laboratories were normal.  Her urine may be infected, but we are unable to get any medications in her currently.  Principal Problem: <principal problem not specified> Diagnosis: Active Problems:   Paranoid schizophrenia (HCC)  Total Time spent with patient: 20 minutes  Past Psychiatric History: See admission H&P  Past Medical History:  Past Medical History:  Diagnosis Date  . Non compliance w medication regimen   . Schizophrenia (HCC)    History reviewed. No pertinent surgical history. Family History: History reviewed. No pertinent family history. Family Psychiatric  History: See admission H&P Social History:  Social History   Substance and Sexual Activity  Alcohol Use Yes   Comment: Refuses to disclose how much     Social History   Substance and Sexual Activity  Drug Use No   Comment: Refuses to answer    Social History   Socioeconomic History  . Marital status: Single    Spouse name: Not on file  . Number of children: Not  on file  . Years of education: Not on file  . Highest education level: Not on file  Occupational History  . Not on file  Tobacco Use  . Smoking status: Current Some Day Smoker    Types: Cigarettes  . Smokeless tobacco: Never Used  Vaping Use  . Vaping Use: Every day  Substance and Sexual Activity  . Alcohol use: Yes    Comment: Refuses to disclose how much  . Drug use: No    Comment: Refuses to answer  . Sexual activity: Not on file    Comment: refused to answer  Other Topics Concern  . Not on file  Social History Narrative  . Not on file   Social Determinants of Health   Financial Resource Strain:   . Difficulty of Paying Living Expenses:   Food Insecurity:   . Worried About Programme researcher, broadcasting/film/video in the Last Year:   . Barista in the Last Year:   Transportation Needs:   . Freight forwarder (Medical):   Marland Kitchen Lack of Transportation (Non-Medical):   Physical Activity:   . Days of Exercise per Week:   . Minutes of Exercise per Session:   Stress:   . Feeling of Stress :   Social Connections:   . Frequency of Communication with Friends and Family:   . Frequency of Social Gatherings with Friends and Family:   . Attends Religious Services:   . Active Member of Clubs or Organizations:   . Attends Banker Meetings:   Marland Kitchen Marital Status:  Additional Social History:                         Sleep: Good  Appetite:  Poor  Current Medications: Current Facility-Administered Medications  Medication Dose Route Frequency Provider Last Rate Last Admin  . acetaminophen (TYLENOL) tablet 650 mg  650 mg Oral Q6H PRN Antonieta Pert, MD      . alum & mag hydroxide-simeth (MAALOX/MYLANTA) 200-200-20 MG/5ML suspension 30 mL  30 mL Oral Q4H PRN Antonieta Pert, MD      . benztropine (COGENTIN) tablet 0.5 mg  0.5 mg Oral BID Antonieta Pert, MD       Or  . benztropine mesylate (COGENTIN) injection 0.5 mg  0.5 mg Intramuscular BID Antonieta Pert, MD   0.5 mg at 03/02/20 0859  . haloperidol (HALDOL) tablet 5 mg  5 mg Oral BID PRN Antonieta Pert, MD       Or  . haloperidol lactate (HALDOL) injection 5 mg  5 mg Intramuscular BID PRN Antonieta Pert, MD   5 mg at 03/01/20 2032  . hydrOXYzine (ATARAX/VISTARIL) tablet 25 mg  25 mg Oral TID PRN Antonieta Pert, MD      . magnesium hydroxide (MILK OF MAGNESIA) suspension 30 mL  30 mL Oral Daily PRN Antonieta Pert, MD      . paliperidone (INVEGA) 24 hr tablet 6 mg  6 mg Oral Daily Antonieta Pert, MD      . traZODone (DESYREL) tablet 50 mg  50 mg Oral QHS PRN Antonieta Pert, MD        Lab Results:  Results for orders placed or performed during the hospital encounter of 03/01/20 (from the past 48 hour(s))  Urinalysis, Complete w Microscopic Urine, Random     Status: Abnormal   Collection Time: 03/02/20  4:27 PM  Result Value Ref Range   Color, Urine YELLOW YELLOW   APPearance CLEAR CLEAR   Specific Gravity, Urine 1.026 1.005 - 1.030   pH 5.0 5.0 - 8.0   Glucose, UA NEGATIVE NEGATIVE mg/dL   Hgb urine dipstick MODERATE (A) NEGATIVE   Bilirubin Urine NEGATIVE NEGATIVE   Ketones, ur 80 (A) NEGATIVE mg/dL   Protein, ur 30 (A) NEGATIVE mg/dL   Nitrite NEGATIVE NEGATIVE   Leukocytes,Ua LARGE (A) NEGATIVE   RBC / HPF 0-5 0 - 5 RBC/hpf   WBC, UA 21-50 0 - 5 WBC/hpf   Bacteria, UA NONE SEEN NONE SEEN   Squamous Epithelial / LPF 0-5 0 - 5   Mucus PRESENT     Comment: Performed at [facility], [address] [place], [place] [address]    Blood Alcohol level:  Lab Results  Component Value Date   ETH <10 02/29/2020   ETH <10 10/04/2019    Metabolic Disorder Labs: Lab Results  Component Value Date   HGBA1C 6.1 (H) 04/16/2015   MPG 128 04/16/2015   Lab Results  Component Value Date   PROLACTIN 13.6 04/16/2015   Lab Results  Component Value Date   CHOL 186 04/16/2015   TRIG 64 04/16/2015   HDL 55 04/16/2015   CHOLHDL 3.4  04/16/2015   VLDL 13 04/16/2015   LDLCALC 118 (H) 04/16/2015    Physical Findings: AIMS: Facial and Oral Movements Muscles of Facial Expression: None, normal Lips and Perioral Area: None, normal Jaw: None, normal Tongue: None, normal,Extremity Movements Upper (arms, wrists, hands, fingers): None, normal Lower (  legs, knees, ankles, toes): None, normal, Trunk Movements Neck, shoulders, hips: None, normal, Overall Severity Severity of abnormal movements (highest score from questions above): None, normal Incapacitation due to abnormal movements: None, normal Patient's awareness of abnormal movements (rate only patient's report): No Awareness, Dental Status Current problems with teeth and/or dentures?: No Does patient usually wear dentures?: No  CIWA:    COWS:     Musculoskeletal: Strength & Muscle Tone: within normal limits Gait & Station: normal Patient leans: [name]  Psychiatric Specialty Exam: Physical Exam Vitals and nursing note reviewed.  HENT:     Head: Normocephalic and atraumatic.  Pulmonary:     Effort: Pulmonary effort is normal.  Neurological:     General: No focal deficit present.     Mental Status: She is alert and oriented to person, place, and time.     Review of Systems  Blood pressure 95/66, pulse 75, temperature 98.3 F (36.8 C), temperature source Oral.There is no height or weight on file to calculate BMI.  General Appearance: Disheveled  Eye Contact:  Minimal  Speech:  Pressured  Volume:  Increased  Mood:  Anxious, Dysphoric and Irritable  Affect:  Labile  Thought Process:  Goal Directed and Descriptions of Associations: Loose  Orientation:  Negative  Thought Content:  Delusions, Paranoid Ideation and Rumination  Suicidal Thoughts:  No  Homicidal Thoughts:  No  Memory:  Immediate;   Poor Recent;   Poor Remote;   Poor  Judgement:  Impaired  Insight:  Lacking  Psychomotor Activity:  Increased  Concentration:  Concentration: Fair and Attention  Span: Fair  Recall:  [employer] of Knowledge:  Fair  Language:  Good  Akathisia:  Negative  Handed:  Right  AIMS (if indicated):     Assets:  Desire for Improvement Resilience  ADL's:  Impaired  Cognition:  WNL  Sleep:  Number of Hours: 6.75     Treatment Plan Summary: Daily contact with patient to assess and evaluate symptoms and progress in treatment, Medication management and Plan : Patient is seen and examined.  Patient is a 59 year old female with the above-stated past psychiatric history who is seen in follow-up.   Diagnosis: 1.  Schizophrenia 2.  Anorexia basically secondary to her paranoia. 3.  Possible urinary tract infection  Pertinent findings on examination today: 1.  Patient remains significantly paranoid. 2.  Her paranoid delusions are preventing her from being able to eat and drink.  Plan: 1.  Continue Cogentin 0.5 mg p.o. twice daily for side effects of medication. 2.  Increase haloperidol to 10 mg IM or p.o. twice daily for psychosis. 3.  We will continue the oral paliperidone tablet, but she has been noncompliant with this currently.  This is for schizophrenia.  When she begins taking this tablet we will discontinue the haloperidol. 4.  Continue trazodone 50 mg p.o. nightly as needed insomnia. 5.  Confirm that dosage of long-acting paliperidone 234 mg IM x1 for schizophrenia was given.  If it has not been we will give it today. 6.  If she begins to show any evidence of orthostasis or other signs or symptoms of significant dehydration we will send her to the emergency room for IV fluids.  Hopefully that will not be necessary. 7.  Disposition planning-in progress.  Antonieta Pert, MD 03/03/2020, 11:38 AM

## 2020-03-03 NOTE — Progress Notes (Signed)
Adult Psychoeducational Group Note  Date:  03/03/2020 Time:  10:47 PM  Group Topic/Focus:  Wrap-Up Group:   The focus of this group is to help patients review their daily goal of treatment and discuss progress on daily workbooks.  Participation Level:  Did Not Attend  Participation Quality:  Did Not Attend  Affect:  Did Not Attend  Cognitive:  Did Not Attend  Insight: None  Engagement in Group:  Did Not Attend  Modes of Intervention:  Did Not Attend  Additional Comments:  Pt did not attend evening wrap up group tonight.  Felipa Furnace 03/03/2020, 10:47 PM

## 2020-03-03 NOTE — [facility] Group Notes (Signed)
Pt did not attend wrap up group this evening. Pt was in bed sleeping.  

## 2020-03-03 NOTE — Progress Notes (Signed)
Patient has been asleep since shift change. No distress noted, safety maintained with 15 min checks. 

## 2020-03-04 DIAGNOSIS — F200 Paranoid schizophrenia: Secondary | ICD-10-CM | POA: Diagnosis not present

## 2020-03-04 MED ORDER — HALOPERIDOL LACTATE 5 MG/ML IJ SOLN
10.0000 mg | Freq: Three times a day (TID) | INTRAMUSCULAR | Status: DC
  Administered 2020-03-04 – 2020-03-05 (×3): 10 mg via INTRAMUSCULAR
  Filled 2020-03-04 (×5): qty 2

## 2020-03-04 MED ORDER — HALOPERIDOL 5 MG PO TABS
10.0000 mg | ORAL_TABLET | Freq: Three times a day (TID) | ORAL | Status: DC
  Filled 2020-03-04 (×5): qty 2

## 2020-03-04 NOTE — Progress Notes (Signed)
   03/03/20 2300  COVID-19 Daily Checkoff  Have you had a fever (temp > 37.80C/100F)  in the past 24 hours?  No  If you have had runny nose, nasal congestion, sneezing in the past 24 hours, has it worsened? No  COVID-19 EXPOSURE  Have you traveled outside the state in the past 14 days? No  Have you been in contact with someone with a confirmed diagnosis of COVID-19 or PUI in the past 14 days without wearing appropriate PPE? No  Have you been living in the same home as a person with confirmed diagnosis of COVID-19 or a PUI (household contact)? No  Have you been diagnosed with COVID-19? No

## 2020-03-04 NOTE — Progress Notes (Signed)
[facility] MD Progress Note  03/04/2020 12:21 PM [name]  MRN:  [id number] Subjective:  Patient is a 59 year old female with a past psychiatric history significant for schizophrenia who was admitted on 03/02/2020 secondary to worsening psychotic symptoms and noncompliance with her schizophrenic medications.  Objective: Patient is seen and examined.  Patient is a 59 year old female with the above-stated past psychiatric history seen in follow-up.  She is essentially unchanged.  She still remains paranoid.  Her p.o. intake is still poor.  We are giving her the injectable Haldol dosages.  Her blood pressure this morning was 90/70, and repeat was 77/63.  Her pulse was 94 and bumped to 108 with repeated checking.  She is afebrile.  She did sleep 6.75 hours.  I have asked her to eat and drink and then we would consider discharge, but she refuses to do that.  Staff who are aware of her in the past stated that most likely as it has in the past she would have to go to the emergency department sometime during the course of the hospitalization for IV fluids.  She has refused laboratories, and I think the only way to check for labs would be to significantly sedate her so that someone would be able to draw blood from her but that again would be quite difficult.  She has not been going to groups.  To reiterate, all of her laboratories on 8/12 including her electrolytes, liver function enzyme the only medication she is on currently are psychiatric medications, and no blood pressure medicines or others.  Principal Problem: <principal problem not specified> Diagnosis: Active Problems:   Paranoid schizophrenia (HCC)  Total Time spent with patient: 20 minutes  Past Psychiatric History: See admission H&P  Past Medical History:  Past Medical History:  Diagnosis Date  . Non compliance w medication regimen   . Schizophrenia (HCC)    History reviewed. No pertinent surgical history. Family History: History reviewed. No  pertinent family history. Family Psychiatric  History: See admission H&P Social History:  Social History   Substance and Sexual Activity  Alcohol Use Yes   Comment: Refuses to disclose how much     Social History   Substance and Sexual Activity  Drug Use No   Comment: Refuses to answer    Social History   Socioeconomic History  . Marital status: Single    Spouse name: Not on file  . Number of children: Not on file  . Years of education: Not on file  . Highest education level: Not on file  Occupational History  . Not on file  Tobacco Use  . Smoking status: Current Some Day Smoker    Types: Cigarettes  . Smokeless tobacco: Never Used  Vaping Use  . Vaping Use: Every day  Substance and Sexual Activity  . Alcohol use: Yes    Comment: Refuses to disclose how much  . Drug use: No    Comment: Refuses to answer  . Sexual activity: Not on file    Comment: refused to answer  Other Topics Concern  . Not on file  Social History Narrative  . Not on file   Social Determinants of Health   Financial Resource Strain:   . Difficulty of Paying Living Expenses:   Food Insecurity:   . Worried About Programme researcher, broadcasting/film/video in the Last Year:   . Barista in the Last Year:   Transportation Needs:   . Freight forwarder (Medical):   Marland Kitchen Lack of  Transportation (Non-Medical):   Physical Activity:   . Days of Exercise per Week:   . Minutes of Exercise per Session:   Stress:   . Feeling of Stress :   Social Connections:   . Frequency of Communication with Friends and Family:   . Frequency of Social Gatherings with Friends and Family:   . Attends Religious Services:   . Active Member of Clubs or Organizations:   . Attends Banker Meetings:   Marland Kitchen Marital Status:    Additional Social History:                         Sleep: Good  Appetite:  Poor  Current Medications: Current Facility-Administered Medications  Medication Dose Route Frequency Provider  Last Rate Last Admin  . acetaminophen (TYLENOL) tablet 650 mg  650 mg Oral Q6H PRN Antonieta Pert, MD      . alum & mag hydroxide-simeth (MAALOX/MYLANTA) 200-200-20 MG/5ML suspension 30 mL  30 mL Oral Q4H PRN Antonieta Pert, MD      . benztropine (COGENTIN) tablet 0.5 mg  0.5 mg Oral BID Antonieta Pert, MD       Or  . benztropine mesylate (COGENTIN) injection 0.5 mg  0.5 mg Intramuscular BID Antonieta Pert, MD   0.5 mg at 03/04/20 0258  . haloperidol (HALDOL) tablet 5 mg  5 mg Oral BID Antonieta Pert, MD       Or  . haloperidol lactate (HALDOL) injection 5 mg  5 mg Intramuscular BID Antonieta Pert, MD   5 mg at 03/04/20 5277  . hydrOXYzine (ATARAX/VISTARIL) tablet 25 mg  25 mg Oral TID PRN Antonieta Pert, MD      . magnesium hydroxide (MILK OF MAGNESIA) suspension 30 mL  30 mL Oral Daily PRN Antonieta Pert, MD      . paliperidone (INVEGA) 24 hr tablet 6 mg  6 mg Oral Daily Antonieta Pert, MD      . traZODone (DESYREL) tablet 50 mg  50 mg Oral QHS PRN Antonieta Pert, MD        Lab Results:  Results for orders placed or performed during the hospital encounter of 03/01/20 (from the past 48 hour(s))  Urinalysis, Complete w Microscopic Urine, Random     Status: Abnormal   Collection Time: 03/02/20  4:27 PM  Result Value Ref Range   Color, Urine YELLOW YELLOW   APPearance CLEAR CLEAR   Specific Gravity, Urine 1.026 1.005 - 1.030   pH 5.0 5.0 - 8.0   Glucose, UA NEGATIVE NEGATIVE mg/dL   Hgb urine dipstick MODERATE (A) NEGATIVE   Bilirubin Urine NEGATIVE NEGATIVE   Ketones, ur 80 (A) NEGATIVE mg/dL   Protein, ur 30 (A) NEGATIVE mg/dL   Nitrite NEGATIVE NEGATIVE   Leukocytes,Ua LARGE (A) NEGATIVE   RBC / HPF 0-5 0 - 5 RBC/hpf   WBC, UA 21-50 0 - 5 WBC/hpf   Bacteria, UA NONE SEEN NONE SEEN   Squamous Epithelial / LPF 0-5 0 - 5   Mucus PRESENT     Comment: Performed at [facility], [address], [place], [place] [address]     Blood Alcohol level:  Lab Results  Component Value Date   [facility] Psychiatric <10 02/29/2020   ETH <10 10/04/2019    Metabolic Disorder Labs: Lab Results  Component Value Date   HGBA1C 6.1 (H) 04/16/2015   MPG 128 04/16/2015   Lab  Results  Component Value Date   PROLACTIN 13.6 04/16/2015   Lab Results  Component Value Date   CHOL 186 04/16/2015   TRIG 64 04/16/2015   HDL 55 04/16/2015   CHOLHDL 3.4 04/16/2015   VLDL 13 04/16/2015   LDLCALC 118 (H) 04/16/2015    Physical Findings: AIMS: Facial and Oral Movements Muscles of Facial Expression: None, normal Lips and Perioral Area: None, normal Jaw: None, normal Tongue: None, normal,Extremity Movements Upper (arms, wrists, hands, fingers): None, normal Lower (legs, knees, ankles, toes): None, normal, Trunk Movements Neck, shoulders, hips: None, normal, Overall Severity Severity of abnormal movements (highest score from questions above): None, normal Incapacitation due to abnormal movements: None, normal Patient's awareness of abnormal movements (rate only patient's report): No Awareness, Dental Status Current problems with teeth and/or dentures?: No Does patient usually wear dentures?: No  CIWA:    COWS:     Musculoskeletal: Strength & Muscle Tone: within normal limits Gait & Station: normal Patient leans: [name]  Psychiatric Specialty Exam: Physical Exam Vitals and nursing note reviewed.  HENT:     Head: Normocephalic and atraumatic.  Pulmonary:     Effort: Pulmonary effort is normal.  Neurological:     General: No focal deficit present.     Mental Status: She is alert and oriented to person, place, and time.     Review of Systems  Blood pressure 95/66, pulse 75, temperature 98.3 F (36.8 C), temperature source Oral.There is no height or weight on file to calculate BMI.  General Appearance: Guarded  Eye Contact:  Minimal  Speech:  Pressured  Volume:  Increased  Mood:  Dysphoric and Irritable  Affect:  Constricted   Thought Process:  Goal Directed and Descriptions of Associations: Loose  Orientation:  Negative  Thought Content:  Delusions and Paranoid Ideation  Suicidal Thoughts:  No  Homicidal Thoughts:  No  Memory:  Immediate;   Poor Recent;   Poor Remote;   Poor  Judgement:  Impaired  Insight:  Lacking  Psychomotor Activity:  Increased  Concentration:  Concentration: Fair and Attention Span: Fair  Recall:  [employer]  Fund of Knowledge:  Fair  Language:  Good  Akathisia:  Negative  Handed:  Right  AIMS (if indicated):     Assets:  Desire for Improvement Resilience  ADL's:  Intact  Cognition:  WNL  Sleep:  Number of Hours: 6.75     Treatment Plan Summary: Daily contact with patient to assess and evaluate symptoms and progress in treatment, Medication management and Plan : Patient is seen and examined.  Patient is a 59 year old female with the above-stated past psychiatric history who is seen in follow-up.  Diagnosis: 1.  Schizophrenia 2.  Anorexia basically secondary to her paranoia. 3.  Possible urinary tract infection  Pertinent findings on examination today: 1.  Patient remains significantly paranoid. 2.  Her paranoid delusions again are preventing her from being able to eat or drink secondary to fear of contamination and poison.  Plan: 1.  Continue Cogentin 0.5 mg p.o. twice daily for side effects of medication. 2.  We will add intramuscular Cogentin 0.5 mg p.o. twice daily as needed if she develops a tremor from the injectable Haldol. 3.  Increase haloperidol to 10 mg IM or p.o. 3 times daily for psychosis. 4.  Patient received the long-acting paliperidone injection 234 mg on 03/03/2020. 5.  Continue trazodone 50 mg p.o. nightly as needed insomnia. 6.  If she continues to have a paranoid induced anorexia we may  need to sedate her, send her to the emergency department for laboratories to assess for dehydration and other electrolyte abnormalities, in addition to IV fluids. 7.   Disposition planning-in progress. Antonieta Pert, MD 03/04/2020, 12:21 PM

## 2020-03-04 NOTE — Progress Notes (Signed)
Patient has been in bed asleep since shift change. No distress noted, safety maintained with 15 min checks. 

## 2020-03-04 NOTE — Progress Notes (Signed)
   03/04/20 1300  Psych Admission Type (Psych Patients Only)  Admission Status Involuntary  Psychosocial Assessment  Patient Complaints Irritability  Eye Contact Avoids (pt asleep no distress)  Facial Expression Flat (pt asleep no distress)  Affect Depressed (pt asleep no distress)  Speech Argumentative (pt asleep no distress)  Interaction Isolative (pt asleep)  Motor Activity Slow (pt asleep )  Appearance/Hygiene Unremarkable (pt asleep )  Behavior Characteristics Irritable  Mood Irritable  Thought Process  Coherency Circumstantial (pt asleep )  Content Blaming others;Preoccupation (pt asleep )  Delusions Paranoid (pt asleep )  Perception Derealization (pt asleep )  Hallucination  (pt asleep )  Judgment Poor (pt asleep )  Confusion None (pt asleep )  Danger to Self  Current suicidal ideation? Denies  Danger to Others  Danger to Others None reported or observed  Dar Note: Patient presents with a flat affect and depressed mood.  Refused oral medication, food and fluid intake.  Routine safety checks maintained every 15 minutes.  Denies suicidal thoughts, auditory and visual hallucinations.  Patient is withdrawn and isolates to her room.  Food and fluid intake encouraged but refused.  Patient is safe on the unit.

## 2020-03-04 NOTE — Tx Team (Signed)
Interdisciplinary Treatment and Diagnostic Plan Update  03/04/2020 Time of Session: 9:05am [name] MRN: [id number]  Principal Diagnosis: <principal problem not specified>  Secondary Diagnoses: Active Problems:   Paranoid schizophrenia ([place])   Current Medications:  Current Facility-Administered Medications  Medication Dose Route Frequency Provider Last Rate Last Admin  . acetaminophen (TYLENOL) tablet 650 mg  650 mg Oral Q6H PRN [name], MD      . alum & mag hydroxide-simeth (MAALOX/MYLANTA) 200-200-20 MG/5ML suspension 30 mL  30 mL Oral Q4H PRN [name], MD      . benztropine (COGENTIN) tablet 0.5 mg  0.5 mg Oral BID [name], MD       Or  . benztropine mesylate (COGENTIN) injection 0.5 mg  0.5 mg Intramuscular BID [name], MD   0.5 mg at 03/04/20 5176  . haloperidol (HALDOL) tablet 10 mg  10 mg Oral TID [name], MD       Or  . haloperidol lactate (HALDOL) injection 10 mg  10 mg Intramuscular TID [name], MD      . hydrOXYzine (ATARAX/VISTARIL) tablet 25 mg  25 mg Oral TID PRN [name], MD      . magnesium hydroxide (MILK OF MAGNESIA) suspension 30 mL  30 mL Oral Daily PRN [name], MD      . paliperidone (INVEGA) 24 hr tablet 6 mg  6 mg Oral Daily [name], MD      . traZODone (DESYREL) tablet 50 mg  50 mg Oral QHS PRN [name], MD       PTA Medications: No medications prior to admission.    Patient Stressors: Health problems Marital or family conflict  Patient Strengths: Supportive family/friends  Treatment Modalities: Medication Management, Group therapy, Case management,  1 to 1 session with clinician, Psychoeducation, Recreational therapy.   Physician Treatment Plan for Primary Diagnosis: <principal problem not specified> Long Term Goal(s): Improvement in symptoms so as ready for discharge Improvement in symptoms so as ready for discharge   Short Term Goals:  Ability to identify changes in lifestyle to reduce recurrence of condition will improve Ability to verbalize feelings will improve Ability to demonstrate self-control will improve Ability to identify and develop effective coping behaviors will improve Ability to maintain clinical measurements within normal limits will improve Compliance with prescribed medications will improve Ability to identify changes in lifestyle to reduce recurrence of condition will improve Ability to verbalize feelings will improve Ability to demonstrate self-control will improve Ability to identify and develop effective coping behaviors will improve Ability to maintain clinical measurements within normal limits will improve Compliance with prescribed medications will improve  Medication Management: Evaluate patient's response, side effects, and tolerance of medication regimen.  Therapeutic Interventions: 1 to 1 sessions, Unit Group sessions and Medication administration.  Evaluation of Outcomes: Not Met  Physician Treatment Plan for Secondary Diagnosis: Active Problems:   Paranoid schizophrenia ([place])  Long Term Goal(s): Improvement in symptoms so as ready for discharge Improvement in symptoms so as ready for discharge   Short Term Goals: Ability to identify changes in lifestyle to reduce recurrence of condition will improve Ability to verbalize feelings will improve Ability to demonstrate self-control will improve Ability to identify and develop effective coping behaviors will improve Ability to maintain clinical measurements within normal limits will improve Compliance with prescribed medications will improve Ability to identify changes in lifestyle to reduce recurrence of condition will improve Ability to verbalize feelings will  improve Ability to demonstrate self-control will improve Ability to identify and develop effective coping behaviors will improve Ability to maintain clinical measurements within  normal limits will improve Compliance with prescribed medications will improve     Medication Management: Evaluate patient's response, side effects, and tolerance of medication regimen.  Therapeutic Interventions: 1 to 1 sessions, Unit Group sessions and Medication administration.  Evaluation of Outcomes: Not Met   RN Treatment Plan for Primary Diagnosis: <principal problem not specified> Long Term Goal(s): Knowledge of disease and therapeutic regimen to maintain health will improve  Short Term Goals: Ability to remain free from injury will improve, Ability to verbalize frustration and anger appropriately will improve, Ability to identify and develop effective coping behaviors will improve and Compliance with prescribed medications will improve  Medication Management: RN will administer medications as ordered by provider, will assess and evaluate patient's response and provide education to patient for prescribed medication. RN will report any adverse and/or side effects to prescribing provider.  Therapeutic Interventions: 1 on 1 counseling sessions, Psychoeducation, Medication administration, Evaluate responses to treatment, Monitor vital signs and CBGs as ordered, Perform/monitor CIWA, COWS, AIMS and Fall Risk screenings as ordered, Perform wound care treatments as ordered.  Evaluation of Outcomes: Not Met   LCSW Treatment Plan for Primary Diagnosis: <principal problem not specified> Long Term Goal(s): Safe transition to appropriate next level of care at discharge, Engage patient in therapeutic group addressing interpersonal concerns.  Short Term Goals: Engage patient in aftercare planning with referrals and resources, Increase social support, Identify triggers associated with mental health/substance abuse issues and Increase skills for wellness and recovery  Therapeutic Interventions: Assess for all discharge needs, 1 to 1 time with Social worker, Explore available resources and support  systems, Assess for adequacy in community support network, Educate family and significant other(s) on suicide prevention, Complete Psychosocial Assessment, Interpersonal group therapy.  Evaluation of Outcomes: Not Met   Progress in Treatment: Attending groups: No. Participating in groups: No. Taking medication as prescribed: Yes. Toleration medication: Yes. Family/Significant other contact made: No, will contact:  if consent is given. Patient understands diagnosis: Yes. Discussing patient identified problems/goals with staff: No. Medical problems stabilized or resolved: No. Denies suicidal/homicidal ideation: Yes. Issues/concerns per patient self-inventory: No.   New problem(s) identified: No, Describe:  none.  New Short Term/Long Term Goal(s):  Patient Goals:  Pt did not attend  Discharge Plan or Barriers:   Reason for Continuation of Hospitalization: Delusions  Hallucinations Medical Issues Medication stabilization  Estimated Length of Stay: 3-5 days  Attendees: Patient: Pt did not attend 03/04/2020  Physician:  03/04/2020  Nursing:  03/04/2020   RN Care Manager: 03/04/2020  Social Worker: Darletta Moll, [place] 03/04/2020   Recreational Therapist:  03/04/2020  Other:  03/04/2020  Other:  03/04/2020  Other: 03/04/2020       Scribe for Treatment Team: Vassie Moselle, [place] 03/04/2020 2:08 PM

## 2020-03-05 DIAGNOSIS — F200 Paranoid schizophrenia: Secondary | ICD-10-CM | POA: Diagnosis not present

## 2020-03-05 MED ORDER — FLUPHENAZINE HCL 5 MG PO TABS
5.0000 mg | ORAL_TABLET | Freq: Three times a day (TID) | ORAL | Status: DC
  Filled 2020-03-05 (×6): qty 1

## 2020-03-05 MED ORDER — FLUPHENAZINE HCL 2.5 MG/ML IJ SOLN
5.0000 mg | Freq: Three times a day (TID) | INTRAMUSCULAR | Status: DC
  Administered 2020-03-05 – 2020-03-06 (×2): 5 mg via INTRAMUSCULAR
  Filled 2020-03-05 (×6): qty 2

## 2020-03-05 NOTE — B[facility] Counselor (Signed)
CSW attempted to complete PSA, however patient became agitated and declined to answer questions.    Ruthann Cancer MSW, LCSW Clincal Social Worker  [facility]

## 2020-03-05 NOTE — Progress Notes (Signed)
Adult Psychoeducational Group Note  Date:  03/05/2020 Time:  9:24 PM  Group Topic/Focus:  Wrap-Up Group:   The focus of this group is to help patients review their daily goal of treatment and discuss progress on daily workbooks.  Participation Level:  Did Not Attend  Participation Quality:  Did not attend  Affect:  Did not attend  Cognitive:  Did not attend  Insight: None  Engagement in Group:  Did not attend  Modes of Intervention:  Did not attend  Additional Comments:  Pt did not attend evening wrap up group this evening.  Felipa Furnace 03/05/2020, 9:24 PM

## 2020-03-05 NOTE — Progress Notes (Signed)
[place] spent most of shift resting in the bed. Patient remains isolative to her room and has no interaction with peers and minimal interaction with staff. She remains irritable when engaged in [place] with others and staff. Patient remains paranoid and guarded and she does not provide staff any information.. Patient is currently in bed resting quietly at this time.

## 2020-03-05 NOTE — Progress Notes (Signed)
[facility] MD Progress Note  03/05/2020 1:59 PM [name]  MRN:  [id number] Subjective:  Patient is a 59 year old female with a past psychiatric history significant for schizophrenia who was admitted on 03/02/2020 secondary to worsening psychotic symptoms and noncompliance with her schizophrenic medications.  Objective: Patient is seen and examined.  Patient is a 59 year old female with the above-stated past psychiatric history who is seen in follow-up.  She remains significantly paranoid and unchanged.  She still not eating or drinking.  I come in the room she gets up uses some profanities and says she is not going to eat or drink, she has not gone to take her medicines.  This is her fourth day in the hospital and she has not been observed taking any food or drink.  I tried to talk to her about it but she continues to have paranoid thinking about the food being poisoned or tainted.  The Haldol really has not been able to be effective in combination with the long-acting paliperidone injection she received.  I attempted to discuss changing her medications today.  Her blood pressure stable.  Early this a.m. is 108/66, pulse is 79.  Repeat showed a blood pressure 92/67 and pulse 98.  She is afebrile.  Surprisingly her sleep continues to do well.  No new laboratories.  Principal Problem: <principal problem not specified> Diagnosis: Active Problems:   Paranoid schizophrenia ([facility])  Total Time spent with patient: 15 minutes  Past Psychiatric History: See admission H&P  Past Medical History:  Past Medical History:  Diagnosis Date  . Non compliance w medication regimen   . Schizophrenia ([facility])    History reviewed. No pertinent surgical history. Family History: History reviewed. No pertinent family history. Family Psychiatric  History: See admission H&P Social History:  Social History   Substance and Sexual Activity  Alcohol Use Yes   Comment: Refuses to disclose how much     Social History    Substance and Sexual Activity  Drug Use No   Comment: Refuses to answer    Social History   Socioeconomic History  . Marital status: Single    Spouse name: Not on file  . Number of children: Not on file  . Years of education: Not on file  . Highest education level: Not on file  Occupational History  . Not on file  Tobacco Use  . Smoking status: Current Some Day Smoker    Types: Cigarettes  . Smokeless tobacco: Never Used  Vaping Use  . Vaping Use: Every day  Substance and Sexual Activity  . Alcohol use: Yes    Comment: Refuses to disclose how much  . Drug use: No    Comment: Refuses to answer  . Sexual activity: Not on file    Comment: refused to answer  Other Topics Concern  . Not on file  Social History Narrative  . Not on file   Social Determinants of Health   Financial Resource Strain:   . Difficulty of Paying Living Expenses:   Food Insecurity:   . Worried About Programme researcher, broadcasting/film/video in the Last Year:   . Barista in the Last Year:   Transportation Needs:   . Freight forwarder (Medical):   Marland Kitchen Lack of Transportation (Non-Medical):   Physical Activity:   . Days of Exercise per Week:   . Minutes of Exercise per Session:   Stress:   . Feeling of Stress :   Social Connections:   . Frequency of  Communication with Friends and Family:   . Frequency of Social Gatherings with Friends and Family:   . Attends Religious Services:   . Active Member of Clubs or Organizations:   . Attends Banker Meetings:   Marland Kitchen Marital Status:    Additional Social History:                         Sleep: Good  Appetite:  Poor  Current Medications: Current Facility-Administered Medications  Medication Dose Route Frequency Provider Last Rate Last Admin  . acetaminophen (TYLENOL) tablet 650 mg  650 mg Oral Q6H PRN Antonieta Pert, MD      . alum & mag hydroxide-simeth (MAALOX/MYLANTA) 200-200-20 MG/5ML suspension 30 mL  30 mL Oral Q4H PRN Antonieta Pert, MD      . benztropine (COGENTIN) tablet 0.5 mg  0.5 mg Oral BID Antonieta Pert, MD       Or  . benztropine mesylate (COGENTIN) injection 0.5 mg  0.5 mg Intramuscular BID Antonieta Pert, MD   0.5 mg at 03/05/20 5284  . fluPHENAZine (PROLIXIN) injection 5 mg  5 mg Intramuscular TID Antonieta Pert, MD      . fluPHENAZine (PROLIXIN) tablet 5 mg  5 mg Oral TID Antonieta Pert, MD      . hydrOXYzine (ATARAX/VISTARIL) tablet 25 mg  25 mg Oral TID PRN Antonieta Pert, MD      . magnesium hydroxide (MILK OF MAGNESIA) suspension 30 mL  30 mL Oral Daily PRN Antonieta Pert, MD      . paliperidone (INVEGA) 24 hr tablet 6 mg  6 mg Oral Daily Antonieta Pert, MD      . traZODone (DESYREL) tablet 50 mg  50 mg Oral QHS PRN Antonieta Pert, MD        Lab Results: No results found for this or any previous visit (from the past 48 hour(s)).  Blood Alcohol level:  Lab Results  Component Value Date   ETH <10 02/29/2020   ETH <10 10/04/2019    Metabolic Disorder Labs: Lab Results  Component Value Date   HGBA1C 6.1 (H) 04/16/2015   MPG 128 04/16/2015   Lab Results  Component Value Date   PROLACTIN 13.6 04/16/2015   Lab Results  Component Value Date   CHOL 186 04/16/2015   TRIG 64 04/16/2015   HDL 55 04/16/2015   CHOLHDL 3.4 04/16/2015   VLDL 13 04/16/2015   LDLCALC 118 (H) 04/16/2015    Physical Findings: AIMS: Facial and Oral Movements Muscles of Facial Expression: None, normal Lips and Perioral Area: None, normal Jaw: None, normal Tongue: None, normal,Extremity Movements Upper (arms, wrists, hands, fingers): None, normal Lower (legs, knees, ankles, toes): None, normal, Trunk Movements Neck, shoulders, hips: None, normal, Overall Severity Severity of abnormal movements (highest score from questions above): None, normal Incapacitation due to abnormal movements: None, normal Patient's awareness of abnormal movements (rate only patient's report): No  Awareness, Dental Status Current problems with teeth and/or dentures?: No Does patient usually wear dentures?: No  CIWA:    COWS:     Musculoskeletal: Strength & Muscle Tone: decreased Gait & Station: unsteady Patient leans: [name]  Psychiatric Specialty Exam: Physical Exam Vitals and nursing note reviewed.  HENT:     Head: Normocephalic and atraumatic.  Pulmonary:     Effort: Pulmonary effort is normal.  Neurological:     General: No focal deficit present.  Mental Status: She is alert.     Review of Systems  Blood pressure 95/66, pulse 75, temperature 98.3 F (36.8 C), temperature source Oral.There is no height or weight on file to calculate BMI.  General Appearance: Disheveled  Eye Contact:  Minimal  Speech:  Pressured  Volume:  Increased  Mood:  Dysphoric and Irritable  Affect:  Labile  Thought Process:  Goal Directed and Descriptions of Associations: Loose  Orientation:  Negative  Thought Content:  Paranoid Ideation  Suicidal Thoughts:  No  Homicidal Thoughts:  No  Memory:  Immediate;   Poor Recent;   Poor Remote;   Poor  Judgement:  Impaired  Insight:  Lacking  Psychomotor Activity:  Normal  Concentration:  Concentration: Fair and Attention Span: Fair  Recall:  [employer] of Knowledge:  Fair  Language:  Fair  Akathisia:  Negative  Handed:  Right  AIMS (if indicated):     Assets:  Desire for Improvement Resilience  ADL's:  Impaired  Cognition:  WNL  Sleep:  Number of Hours: 6.75     Treatment Plan Summary: Daily contact with patient to assess and evaluate symptoms and progress in treatment, Medication management and Plan : Patient is seen and examined.  Patient is a 59 year old female with the above-stated past psychiatric history who is seen in follow-up.   Diagnosis: 1. Schizophrenia 2. Anorexia basically secondary to her paranoia. 3.Possible urinary tract infection  Pertinent findings on examination today: 1.  Patient continues with  significant paranoia. 2.  Patient continues with significant irritability. 3.  Patient still refusing to take any oral medicines, oral food or liquids.  Plan: 1.  Continue Cogentin 0.5 mg p.o. or IM twice daily for side effects of medications. 2.  Stop Haldol. 3.  Start Prolixin 5 mg p.o. or IM 3 times daily for paranoia and psychosis. 4.  Patient received the paliperidone long-acting injection 234 mg on 8/14.  This is for psychosis. 5.  Continue haloperidol 6 mg p.o. nightly for psychosis.  We are all aware that she is not taking this. 6.  Continue trazodone 50 mg p.o. nightly as needed insomnia. 7.  If patient does not take anything p.o. liquid or nutritional material tonight will attempt to send to emergency room tomorrow for evaluation of electrolytes and lab work. 8.  Disposition planning-in progress.  Antonieta Pert, MD 03/05/2020, 1:59 PM

## 2020-03-06 DIAGNOSIS — F200 Paranoid schizophrenia: Secondary | ICD-10-CM | POA: Diagnosis not present

## 2020-03-06 LAB — URINALYSIS, COMPLETE (UACMP) WITH MICROSCOPIC
Bacteria, UA: NONE SEEN
Bilirubin Urine: NEGATIVE
Glucose, UA: NEGATIVE mg/dL
Ketones, ur: 80 mg/dL — AB
Leukocytes,Ua: NEGATIVE
Nitrite: NEGATIVE
Protein, ur: 30 mg/dL — AB
Specific Gravity, Urine: 1.024 (ref 1.005–1.030)
pH: 5 (ref 5.0–8.0)

## 2020-03-06 LAB — BASIC METABOLIC PANEL
Anion gap: 20 — ABNORMAL HIGH (ref 5–15)
Anion gap: 15 (ref 5–15)
BUN: 33 mg/dL — ABNORMAL HIGH (ref 6–20)
BUN: 24 mg/dL — ABNORMAL HIGH (ref 6–20)
CO2: 16 mmol/L — ABNORMAL LOW (ref 22–32)
CO2: 15 mmol/L — ABNORMAL LOW (ref 22–32)
Calcium: 9.7 mg/dL (ref 8.9–10.3)
Calcium: 7.3 mg/dL — ABNORMAL LOW (ref 8.9–10.3)
Chloride: 108 mmol/L (ref 98–111)
Chloride: 119 mmol/L — ABNORMAL HIGH (ref 98–111)
Creatinine, Ser: 1.31 mg/dL — ABNORMAL HIGH (ref 0.44–1.00)
Creatinine, Ser: 0.98 mg/dL (ref 0.44–1.00)
GFR calc Af Amer: 52 mL/min — ABNORMAL LOW (ref >60)
GFR calc Af Amer: >60 mL/min (ref >60)
GFR calc non Af Amer: 44 mL/min — ABNORMAL LOW (ref >60)
GFR calc non Af Amer: >60 mL/min (ref >60)
Glucose, Bld: 114 mg/dL — ABNORMAL HIGH (ref 70–99)
Glucose, Bld: 87 mg/dL (ref 70–99)
Potassium: 4.5 mmol/L (ref 3.5–5.1)
Potassium: 4.2 mmol/L (ref 3.5–5.1)
Sodium: 144 mmol/L (ref 135–145)
Sodium: 149 mmol/L — ABNORMAL HIGH (ref 135–145)

## 2020-03-06 LAB — CBC WITH DIFFERENTIAL/PLATELET
Abs Immature Granulocytes: 0.02 10*3/uL (ref 0.00–0.07)
Basophils Absolute: 0 10*3/uL (ref 0.0–0.1)
Basophils Relative: 0 %
Eosinophils Absolute: 0 10*3/uL (ref 0.0–0.5)
Eosinophils Relative: 0 %
HCT: 51.9 % — ABNORMAL HIGH (ref 36.0–46.0)
Hemoglobin: 16.8 g/dL — ABNORMAL HIGH (ref 12.0–15.0)
Immature Granulocytes: 0 %
Lymphocytes Relative: 11 %
Lymphs Abs: 0.6 10*3/uL — ABNORMAL LOW (ref 0.7–4.0)
MCH: 28.3 pg (ref 26.0–34.0)
MCHC: 32.4 g/dL (ref 30.0–36.0)
MCV: 87.5 fL (ref 80.0–100.0)
Monocytes Absolute: 0.4 10*3/uL (ref 0.1–1.0)
Monocytes Relative: 7 %
Neutro Abs: 4.3 10*3/uL (ref 1.7–7.7)
Neutrophils Relative %: 82 %
Platelets: 242 10*3/uL (ref 150–400)
RBC: 5.93 MIL/uL — ABNORMAL HIGH (ref 3.87–5.11)
RDW: 13.2 % (ref 11.5–15.5)
WBC: 5.3 10*3/uL (ref 4.0–10.5)
nRBC: 0 % (ref 0.0–0.2)

## 2020-03-06 MED ORDER — SODIUM CHLORIDE 0.9 % IV BOLUS
1000.0000 mL | Freq: Once | INTRAVENOUS | Status: AC
  Administered 2020-03-06: 1000 mL via INTRAVENOUS

## 2020-03-06 MED ORDER — FLUPHENAZINE HCL 2.5 MG/ML IJ SOLN
5.0000 mg | Freq: Two times a day (BID) | INTRAMUSCULAR | Status: DC
  Administered 2020-03-07: 5 mg via INTRAMUSCULAR
  Filled 2020-03-06 (×3): qty 2

## 2020-03-06 MED ORDER — FLUPHENAZINE HCL 2.5 MG/ML IJ SOLN
10.0000 mg | Freq: Every day | INTRAMUSCULAR | Status: DC
  Administered 2020-03-06: 10 mg via INTRAMUSCULAR
  Filled 2020-03-06 (×2): qty 4

## 2020-03-06 MED ORDER — FLUPHENAZINE HCL 5 MG PO TABS
5.0000 mg | ORAL_TABLET | Freq: Two times a day (BID) | ORAL | Status: DC
  Filled 2020-03-06 (×3): qty 1

## 2020-03-06 MED ORDER — FLUPHENAZINE HCL 10 MG PO TABS
10.0000 mg | ORAL_TABLET | Freq: Every day | ORAL | Status: DC
  Filled 2020-03-06 (×2): qty 1

## 2020-03-06 NOTE — Progress Notes (Signed)
Pt continues to refuse PO medications , but takes IMs without incident . Pt encouraged to eat and drink, but pt did accept water this evening    03/06/20 2200  Psych Admission Type (Psych Patients Only)  Admission Status Involuntary  Psychosocial Assessment  Patient Complaints Irritability  Eye Contact Avoids  Facial Expression Flat  Affect Appropriate to circumstance  Speech Argumentative  Interaction Isolative  Motor Activity Slow  Appearance/Hygiene Unremarkable  Behavior Characteristics Fidgety;Irritable  Mood Suspicious  Aggressive Behavior  Effect No apparent injury  Thought Process  Coherency Circumstantial  Content Paranoia  Delusions Paranoid  Perception Hallucinations  Hallucination Auditory  Judgment Impaired  Confusion Moderate  Danger to Self  Current suicidal ideation? Denies  Danger to Others  Danger to Others None reported or observed

## 2020-03-06 NOTE — ED Notes (Addendum)
Patient remains sad and withdrawn with a flat affect but pleasant otherwise.  Somewhat paranoid thinking that the staff at [facility] has been giving her "contaminated meds".  She has allowed placement of an IV for lab work and IV fluids.  Offered meal tray and something to drink however she did refuse that. 1:1 sitter remains at bedside.

## 2020-03-06 NOTE — ED Notes (Signed)
[facility] called for transfer back to [facility].

## 2020-03-06 NOTE — ED Provider Notes (Signed)
[facility]-EMERGENCY DEPT Provider Note   CSN: [id number] Arrival date & time: 03/06/20  3557     History Chief Complaint  Patient presents with  . Schizophrenia    [name] is a 59 y.o. female.  Patient is at behavioral health.  She has not been eating or drinking.  She was sent to the emergency department to evaluate dehydration.  He is not having any pain  The history is provided by the patient. No language interpreter was used.  Weakness Severity:  Mild Onset quality:  Gradual Timing:  Constant Progression:  Unable to specify Chronicity:  New Context: not alcohol use   Relieved by:  Nothing Worsened by:  Nothing Ineffective treatments:  None tried Associated symptoms: no abdominal pain, no chest pain, no cough, no diarrhea, no frequency, no headaches and no seizures        Past Medical History:  Diagnosis Date  . Non compliance w medication regimen   . Schizophrenia [facility])     Patient Active Problem List   Diagnosis Date Noted  . Paranoid schizophrenia (HCC) 03/01/2020  . Schizophrenia (HCC) 11/26/2017  . Noncompliance with diet and medication regimen   . Psychoses (HCC)   . Hypokalemic alkalosis 11/11/2011  . Medically noncompliant 11/11/2011  . Schizophrenia, paranoid (HCC) 11/07/2011    History reviewed. No pertinent surgical history.   OB History   No obstetric history on file.     History reviewed. No pertinent family history.  Social History   Tobacco Use  . Smoking status: Current Some Day Smoker    Types: Cigarettes  . Smokeless tobacco: Never Used  Vaping Use  . Vaping Use: Every day  Substance Use Topics  . Alcohol use: Yes    Comment: Refuses to disclose how much  . Drug use: No    Comment: Refuses to answer    Home Medications Prior to Admission medications   Not on File    Allergies    Aspirin and Geodon [ziprasidone hcl]  Review of Systems   Review of Systems  Constitutional: Negative for  appetite change and fatigue.  HENT: Negative for congestion, ear discharge and sinus pressure.   Eyes: Negative for discharge.  Respiratory: Negative for cough.   Cardiovascular: Negative for chest pain.  Gastrointestinal: Negative for abdominal pain and diarrhea.  Genitourinary: Negative for frequency and hematuria.  Musculoskeletal: Negative for back pain.  Skin: Negative for rash.  Neurological: Positive for weakness. Negative for seizures and headaches.  Psychiatric/Behavioral: Negative for hallucinations.    Physical Exam Updated Vital Signs BP (!) 129/57   Pulse 92   Temp 98 F (36.7 C) (Oral)   Resp 16   SpO2 100%   Physical Exam Vitals and nursing note reviewed.  Constitutional:      Appearance: She is well-developed.  HENT:     Head: Normocephalic.     Mouth/Throat:     Comments: Dry mucous membrane Eyes:     General: No scleral icterus.    Conjunctiva/sclera: Conjunctivae normal.  Neck:     Thyroid: No thyromegaly.  Cardiovascular:     Rate and Rhythm: Normal rate and regular rhythm.     Heart sounds: No murmur heard.  No friction rub. No gallop.   Pulmonary:     Breath sounds: No stridor. No wheezing or rales.  Chest:     Chest wall: No tenderness.  Abdominal:     General: There is no distension.     Tenderness: There is  no abdominal tenderness. There is no rebound.  Musculoskeletal:        General: Normal range of motion.     Cervical back: Neck supple.  Lymphadenopathy:     Cervical: No cervical adenopathy.  Skin:    Findings: No erythema or rash.  Neurological:     Mental Status: She is alert and oriented to person, place, and time.     Motor: No abnormal muscle tone.     Coordination: Coordination normal.     ED Results / Procedures / Treatments   Labs (all labs ordered are listed, but only abnormal results are displayed) Labs Reviewed  URINALYSIS, COMPLETE (UACMP) WITH MICROSCOPIC - Abnormal; Notable for the following components:       Result Value   Hgb urine dipstick MODERATE (*)    Ketones, ur 80 (*)    Protein, ur 30 (*)    Leukocytes,Ua LARGE (*)    All other components within normal limits  CBC WITH DIFFERENTIAL/PLATELET - Abnormal; Notable for the following components:   RBC 5.93 (*)    Hemoglobin 16.8 (*)    HCT 51.9 (*)    Lymphs Abs 0.6 (*)    All other components within normal limits  BASIC METABOLIC PANEL - Abnormal; Notable for the following components:   CO2 16 (*)    Glucose, Bld 114 (*)    BUN 33 (*)    Creatinine, Ser 1.31 (*)    GFR calc non Af Amer 44 (*)    GFR calc Af Amer 52 (*)    Anion gap 20 (*)    All other components within normal limits  BASIC METABOLIC PANEL - Abnormal; Notable for the following components:   Sodium 149 (*)    Chloride 119 (*)    CO2 15 (*)    BUN 24 (*)    Calcium 7.3 (*)    All other components within normal limits  TSH    EKG None  Radiology No results found.  Procedures Procedures (including critical care time)  Medications Ordered in ED Medications  acetaminophen (TYLENOL) tablet 650 mg (has no administration in time range)  alum & mag hydroxide-simeth (MAALOX/MYLANTA) 200-200-20 MG/5ML suspension 30 mL (has no administration in time range)  hydrOXYzine (ATARAX/VISTARIL) tablet 25 mg (has no administration in time range)  magnesium hydroxide (MILK OF MAGNESIA) suspension 30 mL (has no administration in time range)  traZODone (DESYREL) tablet 50 mg (has no administration in time range)  benztropine (COGENTIN) tablet 0.5 mg ( Oral See Alternative 03/05/20 1646)    Or  benztropine mesylate (COGENTIN) injection 0.5 mg (0.5 mg Intramuscular Given 03/05/20 1646)  paliperidone (INVEGA) 24 hr tablet 6 mg (6 mg Oral Not Given 03/05/20 0823)  fluPHENAZine (PROLIXIN) tablet 5 mg (5 mg Oral Not Given 03/05/20 1705)  fluPHENAZine (PROLIXIN) injection 5 mg (5 mg Intramuscular Given 03/05/20 1637)  paliperidone (INVEGA SUSTENNA) injection 234 mg (234 mg  Intramuscular Given 03/02/20 0859)  sodium chloride 0.9 % bolus 1,000 mL ( Intravenous Stopped 03/06/20 1058)  sodium chloride 0.9 % bolus 1,000 mL ( Intravenous Stopped 03/06/20 1216)    ED Course  I have reviewed the triage vital signs and the nursing notes.  Pertinent labs & imaging results that were available during my care of the patient were reviewed by me and considered in my medical decision making (see chart for details).    MDM Rules/Calculators/A&P  Patient with dehydration from not eating or drinking.  Patient was given 2 L of fluids and hydrated.  She is sent back to behavioral health       This patient presents to the ED for concern of dehydration, this involves an extensive number of treatment options, and is a complaint that carries with it a high risk of complications and morbidity.  The differential diagnosis includes dehydration from not eating or drinking  Lab Tests:   I Ordered, reviewed, and interpreted labs, which included CBC chemistries that showed dehydration  Medicines ordered:  I ordered medication normal saline for hydration Imaging Studies ordered:     Additional history obtained:   Additional history obtained from records and behavioral health person  Previous records obtained and reviewed.  Consultations Obtained:     Reevaluation:  After the interventions stated above, I reevaluated the patient and found improved  Critical Interventions:  .   Final Clinical Impression(s) / ED Diagnoses Final diagnoses:  None    Rx / DC Orders ED Discharge Orders    None       Bethann Berkshire, MD 03/06/20 1334

## 2020-03-06 NOTE — Progress Notes (Signed)
[facility]  03/06/2020 3:25 PM [name]  MRN:  [id number] Subjective:  Patient is a 59 year old female with a past psychiatric history significant for schizophrenia who was admitted on 03/02/2020 secondary to worsening psychotic symptoms and noncompliance with her schizophrenic medications.  Objective: Patient is seen and examined.  Patient is a 59 year old female with the above-stated past psychiatric history is seen in follow-up.  Unfortunately yesterday switching her medications to the Prolixin was not effective in getting her to decrease her paranoia about eating or drinking.  She had to be sent to the emergency department for IV fluids and laboratories.  She received 2 L of IV fluid in the emergency department and then transferred back to our facility.  Review of her laboratories in the emergency department revealed a mildly elevated sodium at 149, a mildly elevated chloride at 119.  Her creatinine was at 0.98.  The rest of her electrolytes were normal.  Her hemoglobin hematocrit were both mildly elevated at 16.8 and 51.9.  White blood cell count was normal at 5.3.  Clearly she was dehydrated.  Her blood pressure while receiving the IV fluids was stable.  The 3 readings that are present are 117/63, 116/63, 115/66.  Her pulse oximetry on room air was 99 to 100%.  The night prior to transfer to the emergency department sleep was not recorded.  No other new laboratories.  She remains paranoid.  I did discuss with her the possibility of heart urinary tract infection, and she stated she will not take pills.  I told her that the intermuscular antibiotics would have to be given and that it can be painful, and she has at least to be agreed to provide a urine sample to check for infection.  She is convinced that she does not have a infection.  Principal Problem: <principal problem not specified> Diagnosis: Active Problems:   Paranoid schizophrenia (HCC)  Total Time spent with patient: 20  minutes  Past Psychiatric History: See admission H&P  Past Medical History:  Past Medical History:  Diagnosis Date  . Non compliance w medication regimen   . Schizophrenia (HCC)    History reviewed. No pertinent surgical history. Family History: History reviewed. No pertinent family history. Family Psychiatric  History: See admission H&P Social History:  Social History   Substance and Sexual Activity  Alcohol Use Yes   Comment: Refuses to disclose how much     Social History   Substance and Sexual Activity  Drug Use No   Comment: Refuses to answer    Social History   Socioeconomic History  . Marital status: Single    Spouse name: Not on file  . Number of children: Not on file  . Years of education: Not on file  . Highest education level: Not on file  Occupational History  . Not on file  Tobacco Use  . Smoking status: Current Some Day Smoker    Types: Cigarettes  . Smokeless tobacco: Never Used  Vaping Use  . Vaping Use: Every day  Substance and Sexual Activity  . Alcohol use: Yes    Comment: Refuses to disclose how much  . Drug use: No    Comment: Refuses to answer  . Sexual activity: Not on file    Comment: refused to answer  Other Topics Concern  . Not on file  Social History Narrative  . Not on file   Social Determinants of Health   Financial Resource Strain:   . Difficulty of Paying Living  Expenses:   Food Insecurity:   . Worried About Programme researcher, broadcasting/film/video in the Last Year:   . Barista in the Last Year:   Transportation Needs:   . Freight forwarder (Medical):   Marland Kitchen Lack of Transportation (Non-Medical):   Physical Activity:   . Days of Exercise per Week:   . Minutes of Exercise per Session:   Stress:   . Feeling of Stress :   Social Connections:   . Frequency of Communication with Friends and Family:   . Frequency of Social Gatherings with Friends and Family:   . Attends Religious Services:   . Active Member of Clubs or  Organizations:   . Attends Banker Meetings:   Marland Kitchen Marital Status:    Additional Social History:                         Sleep: Fair  Appetite:  Poor  Current Medications: Current Facility-Administered Medications  Medication Dose Route Frequency Provider Last Rate Last Admin  . acetaminophen (TYLENOL) tablet 650 mg  650 mg Oral Q6H PRN Antonieta Pert, MD      . alum & mag hydroxide-simeth (MAALOX/MYLANTA) 200-200-20 MG/5ML suspension 30 mL  30 mL Oral Q4H PRN Antonieta Pert, MD      . benztropine (COGENTIN) tablet 0.5 mg  0.5 mg Oral BID Antonieta Pert, MD       Or  . benztropine mesylate (COGENTIN) injection 0.5 mg  0.5 mg Intramuscular BID Antonieta Pert, MD   0.5 mg at 03/05/20 1646  . fluPHENAZine (PROLIXIN) injection 5 mg  5 mg Intramuscular TID Antonieta Pert, MD   5 mg at 03/05/20 1637  . fluPHENAZine (PROLIXIN) tablet 5 mg  5 mg Oral TID Antonieta Pert, MD      . hydrOXYzine (ATARAX/VISTARIL) tablet 25 mg  25 mg Oral TID PRN Antonieta Pert, MD      . magnesium hydroxide (MILK OF MAGNESIA) suspension 30 mL  30 mL Oral Daily PRN Antonieta Pert, MD      . paliperidone (INVEGA) 24 hr tablet 6 mg  6 mg Oral Daily Antonieta Pert, MD      . traZODone (DESYREL) tablet 50 mg  50 mg Oral QHS PRN Antonieta Pert, MD        Lab Results:  Results for orders placed or performed during the hospital encounter of 03/01/20 (from the past 48 hour(s))  CBC with Differential/Platelet     Status: Abnormal   Collection Time: 03/06/20  9:39 AM  Result Value Ref Range   WBC 5.3 4.0 - 10.5 K/uL   RBC 5.93 (H) 3.87 - 5.11 MIL/uL   Hemoglobin 16.8 (H) 12.0 - 15.0 g/dL   HCT 38.1 (H) 36 - 46 %   MCV 87.5 80.0 - 100.0 fL   MCH 28.3 26.0 - 34.0 pg   MCHC 32.4 30.0 - 36.0 g/dL   RDW 01.7 51.0 - 25.8 %   Platelets 242 150 - 400 K/uL   nRBC 0.0 0.0 - 0.2 %   Neutrophils Relative % 82 %   Neutro Abs 4.3 1.7 - 7.7 K/uL   Lymphocytes  Relative 11 %   Lymphs Abs 0.6 (L) 0.7 - 4.0 K/uL   Monocytes Relative 7 %   Monocytes Absolute 0.4 0 - 1 K/uL   Eosinophils Relative 0 %   Eosinophils Absolute 0.0 0 - 0 K/uL  Basophils Relative 0 %   Basophils Absolute 0.0 0 - 0 K/uL   Immature Granulocytes 0 %   Abs Immature Granulocytes 0.02 0.00 - 0.07 K/uL    Comment: Performed at [facility], [address] [place], [place] [address]  Basic metabolic panel     Status: Abnormal   Collection Time: 03/06/20  9:39 AM  Result Value Ref Range   Sodium 144 135 - 145 mmol/L   Potassium 4.5 3.5 - 5.1 mmol/L   Chloride 108 98 - 111 mmol/L   CO2 16 (L) 22 - 32 mmol/L   Glucose, Bld 114 (H) 70 - 99 mg/dL    Comment: Glucose reference range applies only to samples taken after fasting for at least 8 hours.   BUN 33 (H) 6 - 20 mg/dL   Creatinine, Ser 1.02 (H) 0.44 - 1.00 mg/dL   Calcium 9.7 8.9 - 72.5 mg/dL   GFR calc non Af Amer 44 (L) >60 mL/min   GFR calc Af Amer 52 (L) >60 mL/min   Anion gap 20 (H) 5 - 15    Comment: Performed at [facility], [address], [place], [place] [address]  Basic metabolic panel     Status: Abnormal   Collection Time: 03/06/20 12:11 PM  Result Value Ref Range   Sodium 149 (H) 135 - 145 mmol/L   Potassium 4.2 3.5 - 5.1 mmol/L   Chloride 119 (H) 98 - 111 mmol/L   CO2 15 (L) 22 - 32 mmol/L   Glucose, Bld 87 70 - 99 mg/dL    Comment: Glucose reference range applies only to samples taken after fasting for at least 8 hours.   BUN 24 (H) 6 - 20 mg/dL   Creatinine, Ser 0.34 0.44 - 1.00 mg/dL   Calcium 7.3 (L) 8.9 - 10.3 mg/dL    Comment: DELTA CHECK NOTED   GFR calc non Af Amer >60 >60 mL/min   GFR calc Af Amer >60 >60 mL/min   Anion gap 15 5 - 15    Comment: Performed at [facility], [address] [place], [place] [address]    Blood Alcohol level:  Lab Results  Component Value Date   ETH <10 02/29/2020   ETH <10 10/04/2019     Metabolic Disorder Labs: Lab Results  Component Value Date   HGBA1C 6.1 (H) 04/16/2015   MPG 128 04/16/2015   Lab Results  Component Value Date   PROLACTIN 13.6 04/16/2015   Lab Results  Component Value Date   CHOL 186 04/16/2015   TRIG 64 04/16/2015   HDL 55 04/16/2015   CHOLHDL 3.4 04/16/2015   VLDL 13 04/16/2015   LDLCALC 118 (H) 04/16/2015    Physical Findings: AIMS: Facial and Oral Movements Muscles of Facial Expression: None, normal Lips and Perioral Area: None, normal Jaw: None, normal Tongue: None, normal,Extremity Movements Upper (arms, wrists, hands, fingers): None, normal Lower (legs, knees, ankles, toes): None, normal, Trunk Movements Neck, shoulders, hips: None, normal, Overall Severity Severity of abnormal movements (highest score from questions above): None, normal Incapacitation due to abnormal movements: None, normal Patient's awareness of abnormal movements (rate only patient's report): No Awareness, Dental Status Current problems with teeth and/or dentures?: No Does patient usually wear dentures?: No  CIWA:    COWS:     Musculoskeletal: Strength & Muscle Tone: decreased Gait & Station: shuffle Patient leans: [name]  Psychiatric Specialty Exam: Physical Exam Vitals and nursing note reviewed.  HENT:  Head: Normocephalic and atraumatic.  Pulmonary:     Effort: Pulmonary effort is normal.  Neurological:     General: No focal deficit present.     Mental Status: She is alert.     Review of Systems  Blood pressure 115/66, pulse 72, temperature 98.3 F (36.8 C), temperature source Oral, resp. rate 16, SpO2 99 %.There is no height or weight on file to calculate BMI.  General Appearance: Disheveled  Eye Contact:  Poor  Speech:  Blocked  Volume:  Increased  Mood:  Dysphoric  Affect:  Constricted  Thought Process:  Goal Directed and Descriptions of Associations: Loose  Orientation:  Negative  Thought Content:  Delusions and Paranoid  Ideation  Suicidal Thoughts:  No  Homicidal Thoughts:  No  Memory:  Immediate;   Poor Recent;   Poor Remote;   Poor  Judgement:  Impaired  Insight:  Lacking  Psychomotor Activity:  Decreased  Concentration:  Concentration: Fair and Attention Span: Fair  Recall:  [employer] of Knowledge:  Fair  Language:  Fair  Akathisia:  Negative  Handed:  Right  AIMS (if indicated):     Assets:  Desire for Improvement Resilience  ADL's:  Impaired  Cognition:  WNL  Sleep:  Number of Hours: 6.75     Treatment Plan Summary: Daily contact with patient to assess and evaluate symptoms and progress in treatment, Medication management and Plan : Patient is seen and examined.  Patient is a 59 year old female with the above-stated past psychiatric history who is seen in follow-up.   Diagnosis: 1. Schizophrenia 2. Anorexia basically secondary to her paranoia. 3.Possible urinary tract infection 4.  Dehydration  Pertinent findings on examination today: 1.  Patient continues with significant paranoia. 2.  Patient continues with significant irritability. 3.  Patient still refusing to take any oral medicines, oral food or liquids. 4.  Patient was transferred to the emergency department at [facility] for IV fluids and she received 2 L of normal saline.  Plan: 1.  Continue Cogentin 0.5 mg p.o. twice daily or IM for side effects of medications. 2.  Increase Prolixin to 5 mg p.o. twice daily and 10 mg p.o. nightly.  This will be given intramuscularly if she refuses the oral medication.  This is for psychosis. 3.  Patient received the long-acting paliperidone injection on 03/02/2020.  This is for psychosis. 4.  Continue paliperidone tablet 6 mg p.o. nightly for psychosis.  (She has refused this throughout the course of the hospitalization). 5.  Continue trazodone 50 mg p.o. nightly as needed insomnia. 6.  We will attempt to collect a urinalysis from a toilet hat to avoid having to do intramuscular  antibiotics.  She is rather thin, and the combination of the antipsychotics with I am antibiotics would be rather painful. 7.  Disposition planning-hopefully the medications will begin to have some effect, decrease her paranoia, and improve her compliance with oral medicines as well as eating and drinking.  Antonieta Pert, MD 03/06/2020, 3:25 PM

## 2020-03-06 NOTE — Discharge Instructions (Addendum)
Drink plenty of fluids

## 2020-03-07 MED ORDER — BENZTROPINE MESYLATE 0.5 MG PO TABS: 0.5000 mg | ORAL_TABLET | Freq: Two times a day (BID) | ORAL | Status: DC | PRN

## 2020-03-07 MED ORDER — BENZTROPINE MESYLATE 1 MG/ML IJ SOLN
0.5000 mg | Freq: Two times a day (BID) | INTRAMUSCULAR | Status: DC | PRN
  Administered 2020-03-08: 0.5 mg via INTRAMUSCULAR

## 2020-03-07 MED ORDER — FLUPHENAZINE HCL 5 MG PO TABS
10.0000 mg | ORAL_TABLET | Freq: Three times a day (TID) | ORAL | Status: DC
  Filled 2020-03-07: qty 2
  Filled 2020-03-07: qty 1
  Filled 2020-03-07 (×2): qty 2
  Filled 2020-03-07 (×3): qty 1
  Filled 2020-03-07: qty 2
  Filled 2020-03-07 (×2): qty 1
  Filled 2020-03-07: qty 2
  Filled 2020-03-07: qty 1
  Filled 2020-03-07 (×2): qty 2
  Filled 2020-03-07 (×3): qty 1
  Filled 2020-03-07: qty 2
  Filled 2020-03-07: qty 1
  Filled 2020-03-07: qty 2
  Filled 2020-03-07: qty 1

## 2020-03-07 MED ORDER — FLUPHENAZINE HCL 2.5 MG/ML IJ SOLN
10.0000 mg | Freq: Three times a day (TID) | INTRAMUSCULAR | Status: DC
  Administered 2020-03-07 – 2020-03-11 (×13): 10 mg via INTRAMUSCULAR
  Filled 2020-03-07 (×19): qty 4

## 2020-03-07 NOTE — Progress Notes (Signed)
[facility] MD Progress Note  03/07/2020 11:24 AM [name]  MRN:  [id number] Subjective:  Patient is a 59 year old female with a past psychiatric history significant for schizophrenia who was admitted on 03/02/2020 secondary to worsening psychotic symptoms and noncompliance with her schizophrenic medications.  Objective: Patient is seen and examined.  Patient is a 59 year old female with the above-stated past psychiatric history who is seen in follow-up.  From a mental status standpoint she is essentially unchanged.  Nursing notes revealed that she did drink some water last night.  I tried to talk with her today with the idea if she would eat and drink that we would be able to get her home.  She would have to display the ability to eat and drink before that would take place.  Her paranoia about eating food and drinking liquids continues.  I told her we would bring her Ensure or other supplements that were in a sealed container, and she does not trust those.  We were able to get a urine from her yesterday, and it showed moderate amount of blood, 80 mg per DL of ketones (consistent with her dehydration), but likely leukocyte negative, nitrate negative, no bacteria was noted.  Her vital signs are stable, she is afebrile.  I am always surprised to see that she sleeps well each night given the severity of her illness.  Nursing notes reflect she slept 6.75 hours last night.  She continues to refuse EKGs.  Principal Problem: <principal problem not specified> Diagnosis: Active Problems:   Paranoid schizophrenia (HCC)  Total Time spent with patient: 20 minutes  Past Psychiatric History: See admission H&P  Past Medical History:  Past Medical History:  Diagnosis Date  . Non compliance w medication regimen   . Schizophrenia (HCC)    History reviewed. No pertinent surgical history. Family History: History reviewed. No pertinent family history. Family Psychiatric  History: See admission H&P Social History:   Social History   Substance and Sexual Activity  Alcohol Use Yes   Comment: Refuses to disclose how much     Social History   Substance and Sexual Activity  Drug Use No   Comment: Refuses to answer    Social History   Socioeconomic History  . Marital status: Single    Spouse name: Not on file  . Number of children: Not on file  . Years of education: Not on file  . Highest education level: Not on file  Occupational History  . Not on file  Tobacco Use  . Smoking status: Current Some Day Smoker    Types: Cigarettes  . Smokeless tobacco: Never Used  Vaping Use  . Vaping Use: Every day  Substance and Sexual Activity  . Alcohol use: Yes    Comment: Refuses to disclose how much  . Drug use: No    Comment: Refuses to answer  . Sexual activity: Not on file    Comment: refused to answer  Other Topics Concern  . Not on file  Social History Narrative  . Not on file   Social Determinants of Health   Financial Resource Strain:   . Difficulty of Paying Living Expenses: Not on file  Food Insecurity:   . Worried About Programme researcher, broadcasting/film/video in the Last Year: Not on file  . Ran Out of Food in the Last Year: Not on file  Transportation Needs:   . Lack of Transportation (Medical): Not on file  . Lack of Transportation (Non-Medical): Not on file  Physical Activity:   .  Days of Exercise per Week: Not on file  . Minutes of Exercise per Session: Not on file  Stress:   . Feeling of Stress : Not on file  Social Connections:   . Frequency of Communication with Friends and Family: Not on file  . Frequency of Social Gatherings with Friends and Family: Not on file  . Attends Religious Services: Not on file  . Active Member of Clubs or Organizations: Not on file  . Attends Banker Meetings: Not on file  . Marital Status: Not on file   Additional Social History:                         Sleep: Good  Appetite:  Poor  Current Medications: Current  Facility-Administered Medications  Medication Dose Route Frequency Provider Last Rate Last Admin  . acetaminophen (TYLENOL) tablet 650 mg  650 mg Oral Q6H PRN Antonieta Pert, MD      . alum & mag hydroxide-simeth (MAALOX/MYLANTA) 200-200-20 MG/5ML suspension 30 mL  30 mL Oral Q4H PRN Antonieta Pert, MD      . benztropine (COGENTIN) tablet 0.5 mg  0.5 mg Oral BID Antonieta Pert, MD       Or  . benztropine mesylate (COGENTIN) injection 0.5 mg  0.5 mg Intramuscular BID Antonieta Pert, MD   0.5 mg at 03/07/20 0802  . fluPHENAZine (PROLIXIN) injection 10 mg  10 mg Intramuscular QHS Antonieta Pert, MD   10 mg at 03/06/20 2036  . fluPHENAZine (PROLIXIN) injection 5 mg  5 mg Intramuscular BID Antonieta Pert, MD   5 mg at 03/07/20 0801  . fluPHENAZine (PROLIXIN) tablet 10 mg  10 mg Oral QHS Antonieta Pert, MD      . fluPHENAZine (PROLIXIN) tablet 5 mg  5 mg Oral BID Antonieta Pert, MD      . hydrOXYzine (ATARAX/VISTARIL) tablet 25 mg  25 mg Oral TID PRN Antonieta Pert, MD      . magnesium hydroxide (MILK OF MAGNESIA) suspension 30 mL  30 mL Oral Daily PRN Antonieta Pert, MD      . paliperidone (INVEGA) 24 hr tablet 6 mg  6 mg Oral Daily Antonieta Pert, MD      . traZODone (DESYREL) tablet 50 mg  50 mg Oral QHS PRN Antonieta Pert, MD        Lab Results:  Results for orders placed or performed during the hospital encounter of 03/01/20 (from the past 48 hour(s))  CBC with Differential/Platelet     Status: Abnormal   Collection Time: 03/06/20  9:39 AM  Result Value Ref Range   WBC 5.3 4.0 - 10.5 K/uL   RBC 5.93 (H) 3.87 - 5.11 MIL/uL   Hemoglobin 16.8 (H) 12.0 - 15.0 g/dL   HCT 91.4 (H) 36 - 46 %   MCV 87.5 80.0 - 100.0 fL   MCH 28.3 26.0 - 34.0 pg   MCHC 32.4 30.0 - 36.0 g/dL   RDW 78.2 95.6 - 21.3 %   Platelets 242 150 - 400 K/uL   nRBC 0.0 0.0 - 0.2 %   Neutrophils Relative % 82 %   Neutro Abs 4.3 1.7 - 7.7 K/uL   Lymphocytes Relative 11 %    Lymphs Abs 0.6 (L) 0.7 - 4.0 K/uL   Monocytes Relative 7 %   Monocytes Absolute 0.4 0 - 1 K/uL   Eosinophils Relative 0 %  Eosinophils Absolute 0.0 0 - 0 K/uL   Basophils Relative 0 %   Basophils Absolute 0.0 0 - 0 K/uL   Immature Granulocytes 0 %   Abs Immature Granulocytes 0.02 0.00 - 0.07 K/uL    Comment: Performed at [facility], [address] [place], [place] [address]  Basic metabolic panel     Status: Abnormal   Collection Time: 03/06/20  9:39 AM  Result Value Ref Range   Sodium 144 135 - 145 mmol/L   Potassium 4.5 3.5 - 5.1 mmol/L   Chloride 108 98 - 111 mmol/L   CO2 16 (L) 22 - 32 mmol/L   Glucose, Bld 114 (H) 70 - 99 mg/dL    Comment: Glucose reference range applies only to samples taken after fasting for at least 8 hours.   BUN 33 (H) 6 - 20 mg/dL   Creatinine, Ser 6.041.31 (H) 0.44 - 1.00 mg/dL   Calcium 9.7 8.9 - 54.010.3 mg/dL   GFR calc non Af Amer 44 (L) >60 mL/min   GFR calc Af Amer 52 (L) >60 mL/min   Anion gap 20 (H) 5 - 15    Comment: Performed at [name], [address] [place], [place] [address]  Basic metabolic panel     Status: Abnormal   Collection Time: 03/06/20 12:11 PM  Result Value Ref Range   Sodium 149 (H) 135 - 145 mmol/L   Potassium 4.2 3.5 - 5.1 mmol/L   Chloride 119 (H) 98 - 111 mmol/L   CO2 15 (L) 22 - 32 mmol/L   Glucose, Bld 87 70 - 99 mg/dL    Comment: Glucose reference range applies only to samples taken after fasting for at least 8 hours.   BUN 24 (H) 6 - 20 mg/dL   Creatinine, Ser 1.470.98 0.44 - 1.00 mg/dL   Calcium 7.3 (L) 8.9 - 10.3 mg/dL    Comment: DELTA CHECK NOTED   GFR calc non Af Amer >60 >60 mL/min   GFR calc Af Amer >60 >60 mL/min   Anion gap 15 5 - 15    Comment: Performed at [facility], [address] [place], [place] [address]  Urinalysis, Complete w Microscopic Urine, Clean Catch     Status: Abnormal   Collection Time: 03/06/20  6:15 PM  Result Value Ref  Range   Color, Urine YELLOW YELLOW   APPearance CLEAR CLEAR   Specific Gravity, Urine 1.024 1.005 - 1.030   pH 5.0 5.0 - 8.0   Glucose, UA NEGATIVE NEGATIVE mg/dL   Hgb urine dipstick MODERATE (A) NEGATIVE   Bilirubin Urine NEGATIVE NEGATIVE   Ketones, ur 80 (A) NEGATIVE mg/dL   Protein, ur 30 (A) NEGATIVE mg/dL   Nitrite NEGATIVE NEGATIVE   Leukocytes,Ua NEGATIVE NEGATIVE   RBC / HPF 0-5 0 - 5 RBC/hpf   WBC, UA 0-5 0 - 5 WBC/hpf   Bacteria, UA NONE SEEN NONE SEEN   Squamous Epithelial / LPF 0-5 0 - 5   Mucus PRESENT    Hyaline Casts, UA PRESENT     Comment: Performed at [facility], [address] [place], [place] [address]    Blood Alcohol level:  Lab Results  Component Value Date   [facility]   ETH <10 10/04/2019    Metabolic Disorder Labs: Lab Results  Component Value Date   HGBA1C 6.1 (H) 04/16/2015   MPG 128 04/16/2015   Lab Results  Component Value Date  PROLACTIN 13.6 04/16/2015   Lab Results  Component Value Date   CHOL 186 04/16/2015   TRIG 64 04/16/2015   HDL 55 04/16/2015   CHOLHDL 3.4 04/16/2015   VLDL 13 04/16/2015   LDLCALC 118 (H) 04/16/2015    Physical Findings: AIMS: Facial and Oral Movements Muscles of Facial Expression: None, normal Lips and Perioral Area: None, normal Jaw: None, normal Tongue: None, normal,Extremity Movements Upper (arms, wrists, hands, fingers): None, normal Lower (legs, knees, ankles, toes): None, normal, Trunk Movements Neck, shoulders, hips: None, normal, Overall Severity Severity of abnormal movements (highest score from questions above): None, normal Incapacitation due to abnormal movements: None, normal Patient's awareness of abnormal movements (rate only patient's report): No Awareness, Dental Status Current problems with teeth and/or dentures?: No Does patient usually wear dentures?: No  CIWA:    COWS:     Musculoskeletal: Strength & Muscle Tone: within normal limits Gait &  Station: normal Patient leans: [name]  Psychiatric Specialty Exam: Physical Exam Vitals and nursing note reviewed.  Pulmonary:     Effort: Pulmonary effort is normal.  Neurological:     General: No focal deficit present.     Mental Status: She is alert.     Review of Systems  Blood pressure 96/73, pulse 88, temperature 98.1 F (36.7 C), temperature source Oral, resp. rate 16, SpO2 99 %.There is no height or weight on file to calculate BMI.  General Appearance: Disheveled  Eye Contact:  Minimal  Speech:  Normal Rate  Volume:  Increased  Mood:  Dysphoric and Irritable  Affect:  Labile  Thought Process:  Goal Directed and Descriptions of Associations: Loose  Orientation:  Negative  Thought Content:  Delusions, Paranoid Ideation and Rumination  Suicidal Thoughts:  No  Homicidal Thoughts:  No  Memory:  Immediate;   Poor Recent;   Poor Remote;   Poor  Judgement:  Impaired  Insight:  Lacking  Psychomotor Activity:  Increased  Concentration:  Concentration: Fair and Attention Span: Fair  Recall:  [employer] of Knowledge:  Fair  Language:  Good  Akathisia:  Negative  Handed:  Right  AIMS (if indicated):     Assets:  Desire for Improvement Resilience  ADL's:  Intact  Cognition:  WNL  Sleep:  Number of Hours: 6.75     Treatment Plan Summary: Daily contact with patient to assess and evaluate symptoms and progress in treatment, Medication management and Plan : Patient is seen and examined.  Patient is a 59 year old female with the above-stated past psychiatric history who is seen in follow-up.   Diagnosis: 1. Schizophrenia 2. Anorexia basically secondary to her paranoia.  Pertinent findings on examination today: 1.  Patient schizophrenia is still very active and not responding to treatment at this point.  She continues to have a significant degree of paranoia, and this paranoia includes fear of food and liquids being poisoned by [place]. 2.  The IV fluids she received yesterday  did lead to some urine production and she does not have a urinary tract infection. 3.  The IV fluids she received yesterday have helped her to maintain her blood pressure. 4.  There is a [facility] that has [facility] as a header, but no other information.  She was apparently there in February 2020.  We will attempt to get a hold of their records to see what medications were effective in being able to get her out of the hospital at that time. 5.  Patient is also been followed  by ACTT service.  Social work will attempt to find out what services following her, and attempt to get more complete records.  Plan: 1.  Continue Cogentin 0.5 mg p.o. or IM twice daily but we will change that to as needed for tremor. 2.  Increase fluphenazine to 10 mg p.o. or IM 3 times daily for psychosis. 3.  Continue hydroxyzine 25 mg p.o. 3 times daily as needed anxiety. 4.  Patient still has written paliperidone 6 mg p.o. nightly, but she is not taking any oral medications.  If she begins to take it this is for psychosis. 5.  Continue trazodone 50 mg p.o. nightly as needed insomnia. 6.  We will attempt to get information from her ACTT service as well as [facility]. 7.  Given the severity of her illness and I suspect continued force IV fluids it may be necessary to transfer her to the medical unit at [facility] so they are able to take care of her medical dehydration as well as her psychiatric illness. 8.  Disposition planning-in progress.  Antonieta Pert, MD 03/07/2020, 11:24 AM

## 2020-03-07 NOTE — Progress Notes (Signed)
   03/07/20 1100  Psych Admission Type (Psych Patients Only)  Admission Status Involuntary  Psychosocial Assessment  Patient Complaints Irritability  Eye Contact Avoids  Facial Expression Flat  Affect Appropriate to circumstance  Speech Argumentative  Interaction Isolative  Motor Activity Slow  Appearance/Hygiene Unremarkable  Behavior Characteristics Unwilling to participate  Mood Suspicious  Aggressive Behavior  Effect No apparent injury  Thought Process  Coherency Circumstantial  Content Paranoia  Delusions Paranoid  Perception Hallucinations  Hallucination Auditory  Judgment Impaired  Confusion Moderate  Danger to Self  Current suicidal ideation? Denies  Danger to Others  Danger to Others None reported or observed  Dar Note: Patient presents with irritable affect and mood.  Food and fluid intake encouraged but refused.  Safety checks maintained every 15 minutes.  Medication given IM.  Remained withdrawn and isolates to her room.  Patient is safe on the unit.

## 2020-03-07 NOTE — Progress Notes (Signed)
Recreation Therapy Notes  Date: 8.19.21 Time: 1000 Location: 500 Hall Dayroom   Group Topic: Communication, Team Building, Problem Solving  Goal Area(s) Addresses:  Patient will effectively work with peer towards shared goal.  Patient will identify skill used to make activity successful.  Patient will identify how skills used during activity can be used to reach post d/c goals.   Intervention: STEM Activity   Activity: [employer]. Patients were provided the following materials: 5 drinking straws, 5 rubber bands, 5 paper clips, 2 index cards and 2 drinking cups. Using the provided materials patients were asked to build a launching mechanisms to launch a ping pong ball approximately 12 feet. Patients were divided into teams of 3-5.   Education: Pharmacist, community, Building control surveyor.   Education Outcome: Acknowledges education/In group clarification offered/Needs additional education.   Clinical Observations/Feedback: Pt did not attend group session.    Caroll Rancher, LRT/CTRS        Lillia Abed, Taji Sather A 03/07/2020 11:09 AM

## 2020-03-07 NOTE — [facility] Group Notes (Signed)
Pt did not attend wrap up group this evening. Pt was in bed sleeping.  

## 2020-03-07 NOTE — BHH Counselor (Signed)
CSW left voicemail for patients guardian/sister [name] Hemmer-Lennon  [phone].   CSW faxed medical records request to [facility] regarding patients hospitalization in 08/2018.    Ruthann Cancer MSW, LCSW Clincal Social Worker  [facility]
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
## 2020-03-07 NOTE — Progress Notes (Signed)
Pt continues to isolate to room, responding to internal stimuli    03/07/20 2200  Psych Admission Type (Psych Patients Only)  Admission Status Involuntary  Psychosocial Assessment  Patient Complaints Anxiety;Irritability  Eye Contact Avoids  Facial Expression Flat  Affect Appropriate to circumstance  Speech Argumentative  Interaction Isolative  Motor Activity Slow  Appearance/Hygiene Unremarkable  Behavior Characteristics Unwilling to participate  Mood Suspicious  Aggressive Behavior  Effect No apparent injury  Thought Process  Coherency Circumstantial  Content Paranoia  Delusions Paranoid  Perception Hallucinations  Hallucination Auditory  Judgment Impaired  Confusion Moderate  Danger to Self  Current suicidal ideation? Denies  Danger to Others  Danger to Others None reported or observed

## 2020-03-08 DIAGNOSIS — Z9114 Patient's other noncompliance with medication regimen: Secondary | ICD-10-CM

## 2020-03-08 DIAGNOSIS — Z9111 Patient's noncompliance with dietary regimen: Secondary | ICD-10-CM

## 2020-03-08 NOTE — Progress Notes (Addendum)
SPIRITUALITY GROUP NOTE  Pt attended spirituality group facilitated by Wilkie Aye, MDIv, BCC.  Group Description:  Group focused on topic of hope.  Patients participated in facilitated discussion around topic, connecting with one another around experiences and definitions for hope.  Group members engaged with visual explorer photos, reflecting on what hope looks like for them today.  Group engaged in discussion around how their definitions of hope are present today in hospital.   Modalities: Psycho-social ed, Adlerian, Narrative, MI  Pt Progress:  Present throughout group.  Was responsive when prompted.  Noted she took feelings of hope "day by day " and that it was dependent upon environment.  [name] states she has not felt hope since arriving at [facility] and describes having been admitted several times previously.  She received encouragement from group.

## 2020-03-08 NOTE — Progress Notes (Signed)
[name]  03/08/2020 6:37 PM [name]  MRN:  [id number] Subjective:  Patient is a 59 year old female with a past psychiatric history significant for schizophrenia who was admitted on 03/02/2020 secondary to worsening psychotic symptoms and noncompliance with her schizophrenic medications.  Objective: Patient is seen and examined.  [name] is a 59 y.o. female with the above-stated past psychiatric history who is seen in follow-up.   On evaluation, patient is irritable and verbally aggressive.  She continues to be agitated that she is hospitalized.  Patient continues to be paranoid refusing to eat and drink most things offered to her.  Patient refuses to answer questions, stating she is not required to.  She does not want to take medication, and is angry that she has been getting injections.  She is pacing in her room, but isolates and does not come out into the morning. Nursing notes reflect she slept 6.75 hours last night.    She continues to refuse EKGs.  Principal Problem: Paranoid schizophrenia (HCC) Diagnosis: Principal Problem:   Paranoid schizophrenia (HCC) Active Problems:   Noncompliance with diet and medication regimen  Total Time spent with patient and in collaboration of care: 25 minutes  Past Psychiatric History: See admission H&P  Past Medical History:  Past Medical History:  Diagnosis Date  . Non compliance w medication regimen   . Schizophrenia (HCC)    History reviewed. No pertinent surgical history. Family History: History reviewed. No pertinent family history. Family Psychiatric  History: See admission H&P Social History:  Social History   Substance and Sexual Activity  Alcohol Use Yes   Comment: Refuses to disclose how much     Social History   Substance and Sexual Activity  Drug Use No   Comment: Refuses to answer    Social History   Socioeconomic History  . Marital status: Single    Spouse name: Not on file  . Number of children:  Not on file  . Years of education: Not on file  . Highest education level: Not on file  Occupational History  . Not on file  Tobacco Use  . Smoking status: Current Some Day Smoker    Types: Cigarettes  . Smokeless tobacco: Never Used  Vaping Use  . Vaping Use: Every day  Substance and Sexual Activity  . Alcohol use: Yes    Comment: Refuses to disclose how much  . Drug use: No    Comment: Refuses to answer  . Sexual activity: Not on file    Comment: refused to answer  Other Topics Concern  . Not on file  Social History Narrative  . Not on file   Social Determinants of Health   Financial Resource Strain:   . Difficulty of Paying Living Expenses: Not on file  Food Insecurity:   . Worried About Programme researcher, broadcasting/film/videounning Out of Food in the Last Year: Not on file  . Ran Out of Food in the Last Year: Not on file  Transportation Needs:   . Lack of Transportation (Medical): Not on file  . Lack of Transportation (Non-Medical): Not on file  Physical Activity:   . Days of Exercise per Week: Not on file  . Minutes of Exercise per Session: Not on file  Stress:   . Feeling of Stress : Not on file  Social Connections:   . Frequency of Communication with Friends and Family: Not on file  . Frequency of Social Gatherings with Friends and Family: Not on file  . Attends Religious  Services: Not on file  . Active Member of Clubs or Organizations: Not on file  . Attends Banker Meetings: Not on file  . Marital Status: Not on file   Additional Social History:                         Sleep: Good  Appetite:  Poor  Current Medications: Current Facility-Administered Medications  Medication Dose Route Frequency Provider Last Rate Last Admin  . acetaminophen (TYLENOL) tablet 650 mg  650 mg Oral Q6H PRN Antonieta Pert, MD      . alum & mag hydroxide-simeth (MAALOX/MYLANTA) 200-200-20 MG/5ML suspension 30 mL  30 mL Oral Q4H PRN Antonieta Pert, MD      . benztropine (COGENTIN)  tablet 0.5 mg  0.5 mg Oral BID PRN Antonieta Pert, MD       Or  . benztropine mesylate (COGENTIN) injection 0.5 mg  0.5 mg Intramuscular BID PRN Antonieta Pert, MD   0.5 mg at 03/08/20 0817  . fluPHENAZine (PROLIXIN) injection 10 mg  10 mg Intramuscular TID Antonieta Pert, MD   10 mg at 03/08/20 1802  . fluPHENAZine (PROLIXIN) tablet 10 mg  10 mg Oral TID Antonieta Pert, MD      . hydrOXYzine (ATARAX/VISTARIL) tablet 25 mg  25 mg Oral TID PRN Antonieta Pert, MD      . magnesium hydroxide (MILK OF MAGNESIA) suspension 30 mL  30 mL Oral Daily PRN Antonieta Pert, MD      . paliperidone (INVEGA) 24 hr tablet 6 mg  6 mg Oral Daily Antonieta Pert, MD      . traZODone (DESYREL) tablet 50 mg  50 mg Oral QHS PRN Antonieta Pert, MD        Lab Results:  No results found for this or any previous visit (from the past 48 hour(s)).  Blood Alcohol level:  Lab Results  Component Value Date   ETH <10 02/29/2020   ETH <10 10/04/2019    Metabolic Disorder Labs: Lab Results  Component Value Date   HGBA1C 6.1 (H) 04/16/2015   MPG 128 04/16/2015   Lab Results  Component Value Date   PROLACTIN 13.6 04/16/2015   Lab Results  Component Value Date   CHOL 186 04/16/2015   TRIG 64 04/16/2015   HDL 55 04/16/2015   CHOLHDL 3.4 04/16/2015   VLDL 13 04/16/2015   LDLCALC 118 (H) 04/16/2015    Physical Findings: AIMS: Facial and Oral Movements Muscles of Facial Expression: None, normal Lips and Perioral Area: None, normal Jaw: None, normal Tongue: None, normal,Extremity Movements Upper (arms, wrists, hands, fingers): None, normal Lower (legs, knees, ankles, toes): None, normal, Trunk Movements Neck, shoulders, hips: None, normal, Overall Severity Severity of abnormal movements (highest score from questions above): None, normal Incapacitation due to abnormal movements: None, normal Patient's awareness of abnormal movements (rate only patient's report): No Awareness,  Dental Status Current problems with teeth and/or dentures?: No Does patient usually wear dentures?: No  CIWA:    COWS:     Musculoskeletal: Strength & Muscle Tone: within normal limits Gait & Station: normal Patient leans: [name]  Psychiatric Specialty Exam: Physical Exam Vitals and nursing note reviewed.  Constitutional:      Comments: Thin  HENT:     Head: Normocephalic and atraumatic.     Nose: Nose normal.  Eyes:     Extraocular Movements: Extraocular movements intact.  Cardiovascular:  Rate and Rhythm: Normal rate.  Pulmonary:     Effort: Pulmonary effort is normal.  Musculoskeletal:        General: Normal range of motion.     Cervical back: Normal range of motion.  Neurological:     General: No focal deficit present.     Mental Status: She is alert.     Review of Systems  Unable to perform ROS: Psychiatric disorder  Patient refuses to answer  Blood pressure (!) 90/50, pulse 96, temperature 98.5 F (36.9 C), temperature source Oral, resp. rate 16, SpO2 99 %.There is no height or weight on file to calculate BMI.  General Appearance: Disheveled  Eye Contact:  Minimal  Speech:  Normal Rate  Volume:  Increased  Mood:  Dysphoric and Irritable  Affect:  Labile  Thought Process:  Goal Directed and Descriptions of Associations: Loose  Orientation:  Negative  Thought Content:  Delusions, Paranoid Ideation and Rumination  Suicidal Thoughts:  No  Homicidal Thoughts:  No  Memory:  Immediate;   Poor Recent;   Poor Remote;   Poor  Judgement:  Impaired  Insight:  Lacking  Psychomotor Activity:  Increased  Concentration:  Concentration: Fair and Attention Span: Fair  Recall:  [employer] of Knowledge:  Fair  Language:  Good  Akathisia:  Negative  Handed:  Right  AIMS (if indicated):     Assets:  Desire for Improvement Resilience  ADL's:  Intact  Cognition:  WNL  Sleep:  Number of Hours: 6.75     Treatment Plan Summary: Daily contact with patient to assess  and evaluate symptoms and progress in treatment, Medication management and Plan : Patient is seen and examined.  Patient is a 59 year old female with the above-stated past psychiatric history who is seen in follow-up.   Patient received IV fluids on 03/07/2020 with production of some urine that was able to be sent for analysis.  She did not have a urinary tract infection.  Her blood pressure has been stable, without tachycardia.  She has not allowed an EKG.  She continues to refuse p.o. medication.  Diagnosis: 1. Schizophrenia 2. Anorexia basically secondary to her paranoia.  Pertinent findings on examination today: 1.  Patient schizophrenia is still very active and not responding to treatment at this point.  She continues to have a significant degree of paranoia, and this paranoia includes fear of food and liquids being poisoned by [place].  2.  There is a note that has [facility] as a header, but no other information.  She was apparently there in February 2020.  We will attempt to get a hold of their records to see what medications were effective in being able to get her out of the hospital at that time. 5.  Patient is also been followed by ACTT service.  Social work will attempt to find out what services following her, and attempt to get more complete records.  Plan: 1.  Continue Cogentin 0.5 mg p.o. or IM twice daily but we will change that to as needed for tremor. 2.  Continue fluphenazine to 10 mg p.o. or IM 3 times daily for psychosis. 3.  Continue hydroxyzine 25 mg p.o. 3 times daily as needed anxiety. 4.  Patient still has written paliperidone 6 mg p.o. nightly, but she is not taking any oral medications.  If she begins to take it this is for psychosis. 5.  Continue trazodone 50 mg p.o. nightly as needed insomnia. 6.  We will attempt  to get information from her ACTT service as well as [facility].  Records have been received and will be reviewed.   7.  Given the  severity of her illness and I suspect continued force IV fluids it may be necessary to transfer her to the medical unit at [facility] hospital so they are able to take care of her medical dehydration as well as her psychiatric illness. 8.  Disposition planning-in progress.  Mariel Craft, MD 03/08/2020, 6:37 PM

## 2020-03-08 NOTE — Progress Notes (Signed)
Dar Note: Patient presents with irritable affect and mood.  Continues to refuse oral medication, food and fluid.  Medication given IM.  Attended group and participated.  Routine safety checks maintained every 15 minutes.  Support and encouragement offered as needed.  Patient is safe on the unit.

## 2020-03-08 NOTE — Tx Team (Signed)
Interdisciplinary Treatment and Diagnostic Plan Update  03/08/2020 Time of Session: 9:55am [name] MRN: [id number]  Principal Diagnosis: <principal problem not specified>  Secondary Diagnoses: Active Problems:   Paranoid schizophrenia ([place])   Current Medications:  Current Facility-Administered Medications  Medication Dose Route Frequency Provider Last Rate Last Admin  . acetaminophen (TYLENOL) tablet 650 mg  650 mg Oral Q6H PRN [name], MD      . alum & mag hydroxide-simeth (MAALOX/MYLANTA) 200-200-20 MG/5ML suspension 30 mL  30 mL Oral Q4H PRN [name], MD      . benztropine (COGENTIN) tablet 0.5 mg  0.5 mg Oral BID PRN [name], MD       Or  . benztropine mesylate (COGENTIN) injection 0.5 mg  0.5 mg Intramuscular BID PRN [name], MD   0.5 mg at 03/08/20 0817  . fluPHENAZine (PROLIXIN) injection 10 mg  10 mg Intramuscular TID [name], MD   10 mg at 03/08/20 0816  . fluPHENAZine (PROLIXIN) tablet 10 mg  10 mg Oral TID [name], MD      . hydrOXYzine (ATARAX/VISTARIL) tablet 25 mg  25 mg Oral TID PRN [name], MD      . magnesium hydroxide (MILK OF MAGNESIA) suspension 30 mL  30 mL Oral Daily PRN [name], MD      . paliperidone (INVEGA) 24 hr tablet 6 mg  6 mg Oral Daily [name], MD      . traZODone (DESYREL) tablet 50 mg  50 mg Oral QHS PRN [name], MD       PTA Medications: No medications prior to admission.    Patient Stressors: Health problems Marital or family conflict  Patient Strengths: Supportive family/friends  Treatment Modalities: Medication Management, Group therapy, Case management,  1 to 1 session with clinician, Psychoeducation, Recreational therapy.   Physician Treatment Plan for Primary Diagnosis: <principal problem not specified> Long Term Goal(s): Improvement in symptoms so as ready for discharge Improvement in symptoms so as ready for discharge    Short Term Goals: Ability to identify changes in lifestyle to reduce recurrence of condition will improve Ability to verbalize feelings will improve Ability to demonstrate self-control will improve Ability to identify and develop effective coping behaviors will improve Ability to maintain clinical measurements within normal limits will improve Compliance with prescribed medications will improve Ability to identify changes in lifestyle to reduce recurrence of condition will improve Ability to verbalize feelings will improve Ability to demonstrate self-control will improve Ability to identify and develop effective coping behaviors will improve Ability to maintain clinical measurements within normal limits will improve Compliance with prescribed medications will improve  Medication Management: Evaluate patient's response, side effects, and tolerance of medication regimen.  Therapeutic Interventions: 1 to 1 sessions, Unit Group sessions and Medication administration.  Evaluation of Outcomes: Not Met  Physician Treatment Plan for Secondary Diagnosis: Active Problems:   Paranoid schizophrenia ([place])  Long Term Goal(s): Improvement in symptoms so as ready for discharge Improvement in symptoms so as ready for discharge   Short Term Goals: Ability to identify changes in lifestyle to reduce recurrence of condition will improve Ability to verbalize feelings will improve Ability to demonstrate self-control will improve Ability to identify and develop effective coping behaviors will improve Ability to maintain clinical measurements within normal limits will improve Compliance with prescribed medications will improve Ability to identify changes in lifestyle to reduce recurrence of condition will improve Ability to verbalize feelings  will improve Ability to demonstrate self-control will improve Ability to identify and develop effective coping behaviors will improve Ability to maintain clinical  measurements within normal limits will improve Compliance with prescribed medications will improve     Medication Management: Evaluate patient's response, side effects, and tolerance of medication regimen.  Therapeutic Interventions: 1 to 1 sessions, Unit Group sessions and Medication administration.  Evaluation of Outcomes: Not Met   RN Treatment Plan for Primary Diagnosis: <principal problem not specified> Long Term Goal(s): Knowledge of disease and therapeutic regimen to maintain health will improve  Short Term Goals: Ability to remain free from injury will improve, Ability to verbalize frustration and anger appropriately will improve, Ability to identify and develop effective coping behaviors will improve and Compliance with prescribed medications will improve  Medication Management: RN will administer medications as ordered by provider, will assess and evaluate patient's response and provide education to patient for prescribed medication. RN will report any adverse and/or side effects to prescribing provider.  Therapeutic Interventions: 1 on 1 counseling sessions, Psychoeducation, Medication administration, Evaluate responses to treatment, Monitor vital signs and CBGs as ordered, Perform/monitor CIWA, COWS, AIMS and Fall Risk screenings as ordered, Perform wound care treatments as ordered.  Evaluation of Outcomes: Not Met   LCSW Treatment Plan for Primary Diagnosis: <principal problem not specified> Long Term Goal(s): Safe transition to appropriate next level of care at discharge, Engage patient in therapeutic group addressing interpersonal concerns.  Short Term Goals: Engage patient in aftercare planning with referrals and resources, Increase social support, Identify triggers associated with mental health/substance abuse issues and Increase skills for wellness and recovery  Therapeutic Interventions: Assess for all discharge needs, 1 to 1 time with Social worker, Explore available  resources and support systems, Assess for adequacy in community support network, Educate family and significant other(s) on suicide prevention, Complete Psychosocial Assessment, Interpersonal group therapy.  Evaluation of Outcomes: Not Met   Progress in Treatment: Attending groups: No. Participating in groups: No. Taking medication as prescribed: Yes. Toleration medication: Yes. Family/Significant other contact made: No, will contact:  have attempted to contact Sister. Patient understands diagnosis: Yes. Discussing patient identified problems/goals with staff: No. Medical problems stabilized or resolved: No. Denies suicidal/homicidal ideation: Yes. Issues/concerns per patient self-inventory: No.   New problem(s) identified: No, Describe:  none.  New Short Term/Long Term Goal(s):  Patient Goals:  Pt did not attend  Discharge Plan or Barriers:   Reason for Continuation of Hospitalization: Delusions  Hallucinations Medical Issues Medication stabilization  Estimated Length of Stay: 3-5 days  Attendees: Patient: Pt did not attend 03/08/2020  Physician:  03/08/2020  Nursing:  03/08/2020  RN Care Manager: 03/08/2020  Social Worker: Darletta Moll, LCSW 03/08/2020  Recreational Therapist:  03/08/2020  Other:  03/08/2020  Other:  03/08/2020  Other: 03/08/2020      Scribe for Treatment Team: Vassie Moselle, LCSW 03/08/2020 11:08 AM

## 2020-03-08 NOTE — Progress Notes (Signed)
Pt status remains same, not eating and drinking minimal water requiring IMs for medication administration    03/08/20 2000  Psych Admission Type (Psych Patients Only)  Admission Status Involuntary  Psychosocial Assessment  Patient Complaints Anxiety;Irritability  Eye Contact Avoids  Facial Expression Flat  Affect Appropriate to circumstance  Speech Argumentative  Interaction Isolative  Motor Activity Slow  Appearance/Hygiene Unremarkable  Behavior Characteristics Unwilling to participate  Mood Suspicious  Aggressive Behavior  Effect No apparent injury  Thought Process  Coherency Circumstantial  Content Paranoia  Delusions Paranoid  Perception Hallucinations  Hallucination Auditory  Judgment Impaired  Confusion Moderate  Danger to Self  Current suicidal ideation? Denies  Danger to Others  Danger to Others None reported or observed

## 2020-03-09 NOTE — Plan of Care (Addendum)
D. Pt was awake, sitting beside her bed upon initial approach- Pt continues to present as flat, suspicious, and irritable during interactions. Pt remains non-compliant with po medications, and refuses to eat, but will drink water- approximately 8 oz of water with encouragement.  Pt currently denies SI/HI and AVH  A. Labs and vitals monitored.  Pt supported emotionally and encouraged to express concerns and ask questions- pt forwards little, stating "I'm not one to talk much".   R. Pt remains safe with 15 minute checks. Will continue POC.    Problem: Education: Goal: Emotional status will improve Outcome: Not Progressing   Problem: Activity: Goal: Interest or engagement in activities will improve Outcome: Not Progressing   Problem: Health Behavior/Discharge Planning: Goal: Compliance with treatment plan for underlying cause of condition will improve Outcome: Not Progressing

## 2020-03-09 NOTE — Progress Notes (Signed)
Adult Psychoeducational Group Note  Date:  03/09/2020 Time:  8:28 PM  Group Topic/Focus:  Wellness Toolbox:   The focus of this group is to discuss various aspects of wellness, balancing those aspects and exploring ways to increase the ability to experience wellness.  Patients will create a wellness toolbox for use upon discharge.  Participation Level:  Minimal  Participation Quality:  Appropriate  Affect:  Flat  Cognitive:  Oriented  Insight: Lacking  Engagement in Group:  Engaged  Modes of Intervention:  Education and Support  Additional Comments:  Patient attended ane participated in group tonight. She reports that she does not like being in the hospital and being in the hospital.  Lita Mains [facility] 03/09/2020, 8:28 PM

## 2020-03-09 NOTE — Progress Notes (Signed)
[place] NOVEL CORONAVIRUS (COVID-19) DAILY CHECK-OFF SYMPTOMS - answer yes or no to each - every day NO YES  Have you had a fever in the past 24 hours?  . Fever (Temp > 37.80C / 100F) X   Have you had any of these symptoms in the past 24 hours? . New Cough .  Sore Throat  .  Shortness of Breath .  Difficulty Breathing .  Unexplained Body Aches   X   Have you had any one of these symptoms in the past 24 hours not related to allergies?   . Runny Nose .  Nasal Congestion .  Sneezing   X   If you have had runny nose, nasal congestion, sneezing in the past 24 hours, has it worsened?  X   EXPOSURES - check yes or no X   Have you traveled outside the state in the past 14 days?  X   Have you been in contact with someone with a confirmed diagnosis of COVID-19 or PUI in the past 14 days without wearing appropriate PPE?  X   Have you been living in the same home as a person with confirmed diagnosis of COVID-19 or a PUI (household contact)?    X   Have you been diagnosed with COVID-19?    X              What to do next: Answered NO to all: Answered YES to anything:   Proceed with unit schedule Follow the [facility] Inpatient Flowsheet.   

## 2020-03-09 NOTE — Progress Notes (Signed)
[facility] MD Progress Note  03/09/2020 12:06 PM [name]  MRN:  [id number] Subjective:  Patient is a 59 year old female with a past psychiatric history significant for schizophrenia who was admitted on 03/02/2020 secondary to worsening psychotic symptoms and noncompliance with her schizophrenic medications.  Objective: Patient is seen and examined.  [name] is a 59 y.o. female with the above-stated past psychiatric history who is seen in follow-up.    Met with nursing staff who provided group therapy today.  [name] did attend group therapy, and was able to participate in self-control to interact with peers.  However, after group she did isolate in her room, and refused to go to recreational therapy outside.  Patient continues to believe that she is confined here after being in treatment that she had.  She states, "I am going to wait for the state legislative to free me."  But when asked if she made a petition to the state legislative she said none.  Patient states that she lives in [place] still.  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  She believes that she is here to receive forced medical procedures against her will.  Patient continues to be paranoid minimally eating and drinking.  She declines offers to go and she is refused from the cafeteria.  Patient refuses to answer questions, stating she is not required to.  She does not want to take medication. Nursing notes reflect she slept 8.5 hours last night.    She continues to refuse EKGs.  Principal Problem: Paranoid schizophrenia ([place]) Diagnosis: Principal Problem:   Paranoid schizophrenia ([place]) Active Problems:   Noncompliance with diet and medication regimen  Total Time spent with patient and in collaboration of care: 25 minutes  Past Psychiatric History: See admission H&P  Past Medical History:  Past Medical History:  Diagnosis Date  . Non compliance w medication  regimen   . Schizophrenia ([place])    History reviewed. No pertinent surgical history. Family History: History reviewed. No pertinent family history. Family Psychiatric  History: See admission H&P Social History:  Social History   Substance and Sexual Activity  Alcohol Use Yes   Comment: Refuses to disclose how much     Social History   Substance and Sexual Activity  Drug Use No   Comment: Refuses to answer    Social History   Socioeconomic History  . Marital status: Single    Spouse name: Not on file  . Number of children: Not on file  . Years of education: Not on file  . Highest education level: Not on file  Occupational History  . Not on file  Tobacco Use  . Smoking status: Current Some Day Smoker    Types: Cigarettes  . Smokeless tobacco: Never Used  Vaping Use  . Vaping Use: Every day  Substance and Sexual Activity  . Alcohol use: Yes    Comment: Refuses to disclose how much  . Drug use: No    Comment: Refuses to answer  . Sexual activity: Not on file    Comment: refused to answer  Other Topics Concern  . Not on file  Social History Narrative  . Not on file   Social Determinants of Health   Financial Resource Strain:   . Difficulty of Paying Living Expenses: Not on file  Food Insecurity:   . Worried About Charity fundraiser in the Last Year: Not on file  .  Ran Out of Food in the Last Year: Not on file  Transportation Needs:   . Lack of Transportation (Medical): Not on file  . Lack of Transportation (Non-Medical): Not on file  Physical Activity:   . Days of Exercise per Week: Not on file  . Minutes of Exercise per Session: Not on file  Stress:   . Feeling of Stress : Not on file  Social Connections:   . Frequency of Communication with Friends and Family: Not on file  . Frequency of Social Gatherings with Friends and Family: Not on file  . Attends Religious Services: Not on file  . Active Member of Clubs or Organizations: Not on file  . Attends English as a second language teacher Meetings: Not on file  . Marital Status: Not on file   Additional Social History:                         Sleep: Good  Appetite:  Poor  Current Medications: Current Facility-Administered Medications  Medication Dose Route Frequency Provider Last Rate Last Admin  . acetaminophen (TYLENOL) tablet 650 mg  650 mg Oral Q6H PRN [name], MD      . alum & mag hydroxide-simeth (MAALOX/MYLANTA) 200-200-20 MG/5ML suspension 30 mL  30 mL Oral Q4H PRN [name], MD      . benztropine (COGENTIN) tablet 0.5 mg  0.5 mg Oral BID PRN [name], MD       Or  . benztropine mesylate (COGENTIN) injection 0.5 mg  0.5 mg Intramuscular BID PRN [name], MD   0.5 mg at 03/08/20 0817  . fluPHENAZine (PROLIXIN) injection 10 mg  10 mg Intramuscular TID [name], MD   10 mg at 03/09/20 [phone]  . fluPHENAZine (PROLIXIN) tablet 10 mg  10 mg Oral TID [name], MD      . hydrOXYzine (ATARAX/VISTARIL) tablet 25 mg  25 mg Oral TID PRN [name], MD      . magnesium hydroxide (MILK OF MAGNESIA) suspension 30 mL  30 mL Oral Daily PRN [name], MD      . paliperidone (INVEGA) 24 hr tablet 6 mg  6 mg Oral Daily [name], MD      . traZODone (DESYREL) tablet 50 mg  50 mg Oral QHS PRN [name], MD        Lab Results:  No results found for this or any previous visit (from the past 65 hour(s)).  Blood Alcohol level:  Lab Results  Component Value Date   ETH <10 02/29/2020   ETH <10 16/96/7893    Metabolic Disorder Labs: Lab Results  Component Value Date   HGBA1C 6.1 (H) 04/16/2015   MPG 128 04/16/2015   Lab Results  Component Value Date   PROLACTIN 13.6 04/16/2015   Lab Results  Component Value Date   CHOL 186 04/16/2015   TRIG 64 04/16/2015   HDL 55 04/16/2015   CHOLHDL 3.4 04/16/2015   VLDL 13 04/16/2015   LDLCALC 118 (H) 04/16/2015    Physical Findings: AIMS: Facial and Oral  Movements Muscles of Facial Expression: None, normal Lips and Perioral Area: None, normal Jaw: None, normal Tongue: None, normal,Extremity Movements Upper (arms, wrists, hands, fingers): None, normal Lower (legs, knees, ankles, toes): None, normal, Trunk Movements Neck, shoulders, hips: None, normal, Overall Severity Severity of abnormal movements (highest score from questions above): None, normal Incapacitation due to abnormal movements: None, normal  Patient's awareness of abnormal movements (rate only patient's report): No Awareness, Dental Status Current problems with teeth and/or dentures?: No Does patient usually wear dentures?: No  CIWA:    COWS:     Musculoskeletal: Strength & Muscle Tone: within normal limits Gait & Station: normal Patient leans: [name]  Psychiatric Specialty Exam: Physical Exam Vitals and nursing note reviewed.  Constitutional:      Comments: Thin  HENT:     Head: Normocephalic and atraumatic.     Nose: Nose normal.  Eyes:     Extraocular Movements: Extraocular movements intact.  Cardiovascular:     Rate and Rhythm: Normal rate.  Pulmonary:     Effort: Pulmonary effort is normal.  Musculoskeletal:        General: Normal range of motion.     Cervical back: Normal range of motion.  Neurological:     General: No focal deficit present.     Mental Status: She is alert.     Review of Systems  Unable to perform ROS: Psychiatric disorder  Patient refuses to answer  Blood pressure (!) 79/55, pulse 98, temperature 98.2 F (36.8 C), temperature source Oral, resp. rate 16, SpO2 99 %.There is no height or weight on file to calculate BMI.  General Appearance: Fairly Groomed and Guarded  Eye Contact:  Minimal  Speech:  Normal Rate  Volume:  Increased  Mood:  Dysphoric and Irritable  Affect:  Labile  Thought Process:  Goal Directed and Descriptions of Associations: Loose  Orientation:  Negative  Thought Content:  Delusions, Paranoid Ideation and  Rumination  Suicidal Thoughts:  No  Homicidal Thoughts:  No  Memory:  Immediate;   Poor Recent;   Poor Remote;   Poor  Judgement:  Impaired  Insight:  Lacking  Psychomotor Activity:  Increased  Concentration:  Concentration: Fair and Attention Span: Fair  Recall:  [employer] of Knowledge:  Fair  Language:  Good  Akathisia:  Negative  Handed:  Right  AIMS (if indicated):     Assets:  Desire for Improvement Resilience  ADL's:  Intact  Cognition:  WNL  Sleep:  Number of Hours: 8.5     Treatment Plan Summary: Daily contact with patient to assess and evaluate symptoms and progress in treatment, Medication management and Plan : Patient is seen and examined.  Patient is a 59 year old female with the above-stated past psychiatric history who is seen in follow-up.   Patient received IV fluids on 03/07/2020 with production of some urine that was able to be sent for analysis.  She did not have a urinary tract infection.  Her blood pressure has been stable, without tachycardia.  She has not allowed an EKG.  She continues to refuse p.o. medication.  Diagnosis: 1. Schizophrenia 2. Anorexia basically secondary to her paranoia.  Pertinent findings on examination today: 1.  Patient schizophrenia is still very active and not responding to treatment at this point.  She continues to have a significant degree of paranoia, and this paranoia includes fear of food and liquids being poisoned by [place].  2.  There is a [facility] has [facility]. She was apparently there in February 2020.  We will attempt to get a hold of their records to see what medications were effective in being able to get her out of the hospital at that time. 5.  Patient is also been followed by ACTT service, possibly through the [place]. Social work will attempt to find out what services following her, and  attempt to get more complete records.   Plan: 1.  Continue Cogentin 0.5 mg p.o. or IM twice daily but we will change  that to as needed for tremor. 2.  Continue fluphenazine to 10 mg p.o. or IM 3 times daily for psychosis. 3.  Continue hydroxyzine 25 mg p.o. 3 times daily as needed anxiety. 4.  Patient still has written paliperidone 6 mg p.o. nightly, but she is not taking any oral medications.  If she begins to take it this is for psychosis. 5.  Continue trazodone 50 mg p.o. nightly as needed insomnia. 6.  We will attempt to get information from her ACTT service as well as [facility].  Records have been received and will be reviewed.   7.  Given the severity of her illness and I suspect continued force IV fluids it may be necessary to transfer her to the medical unit at [facility] hospital so they are able to take care of her medical dehydration as well as her psychiatric illness. 8.  Disposition planning-in progress. 9.  Spoke with social work regarding transfer to [place] facility where she could be followed long-term with outpatient psychiatric services.  Social work will follow-up.   Lavella Hammock, MD 03/09/2020, 12:06 PM

## 2020-03-09 NOTE — Progress Notes (Signed)
Patient was up and attended group tonight. Writer spoke with her after group and she was irritable when asked if she would like something to drink since she was not eating food. She reported that she was being held against her will and we have her entrapped here. Writer asked if she lived with her sister and she reported that she does not have a sister and she lives with her grandmother. She walked off and returned to her room. Safety maintained with 15 min checks.

## 2020-03-10 NOTE — Progress Notes (Signed)
   03/10/20 2100  °COVID-19 Daily Checkoff  °Have you had a fever (temp > 37.80C/100F)  in the past 24 hours?  No  °If you have had runny nose, nasal congestion, sneezing in the past 24 hours, has it worsened? No  °COVID-19 EXPOSURE  °Have you traveled outside the state in the past 14 days? No  °Have you been in contact with someone with a confirmed diagnosis of COVID-19 or PUI in the past 14 days without wearing appropriate PPE? No  °Have you been living in the same home as a person with confirmed diagnosis of COVID-19 or a PUI (household contact)? No  °Have you been diagnosed with COVID-19? No  ° °

## 2020-03-10 NOTE — Progress Notes (Signed)
[facility] Group Notes:  (Nursing/MHT/Case Management/Adjunct)  Date:  03/10/2020  Time:  2030  Type of Therapy:  wrap up group  Participation Level:  Minimal  Participation Quality:  Attentive  Affect:  Irritable and Resistant  Cognitive:  Alert  Insight:  Lacking  Engagement in Group:  Limited  Modes of Intervention:  Clarification, Education and Support  Summary of Progress/Problems: Positive thinking and positive change were discussed.   Marcille Buffy 03/10/2020, 8:59 PM

## 2020-03-10 NOTE — Progress Notes (Signed)
D. Pt presents as flat and very depressed, and remains isolative to her room, but did come out to participate in group this am. Pt continues to refuse food and po medications, but agrees to drink bottled water brought to her. Pt currently denies SI/HI and AVH  A. Labs and vitals monitored.  Pt supported emotionally and encouraged to express concerns and ask questions.   R. Pt remains safe with 15 minute checks. Will continue POC.

## 2020-03-10 NOTE — Progress Notes (Signed)
[name] [place] MD Progress Note  03/10/2020 7:20 PM [name]  MRN:  [id number] Subjective:  Patient is a 59 year old female with a past psychiatric history significant for schizophrenia who was admitted on 03/02/2020 secondary to worsening psychotic symptoms and noncompliance with her schizophrenic medications.  Objective: Patient is seen and examined.  [name] is a 59 y.o. female with the above-stated past psychiatric history who is seen in follow-up.    Met with nursing staff who provided group therapy today.  [name] did attend group therapy, and was able to participate.  Patient did contribute to conversation, and is seen to be interacting well with group therapist coordinator.  Attempting to encourage patient to go to the dining hall to pick her own food following group therapy.  Patient seemed contemplative about walking to dining hall with the group therapist later, however ultimately chose not to and returned to her room muttering.  On further evaluation, patient states she does not want to talk more or answer any questions.  P.O. intake remains minimal, however she is more inclined to eat packaged foods then foods brought from the cafeteria from nursing staff.  She will drink bottled water.  Her vital signs are stable.  Her sleep is improved.  She continues to refuse EKGs.  Principal Problem: Paranoid schizophrenia ([name]) Diagnosis: Principal Problem:   Paranoid schizophrenia ([name]) Active Problems:   Noncompliance with diet and medication regimen  Total Time spent with patient and in collaboration of care: 25 minutes  Past Psychiatric History: See admission H&P  Past Medical History:  Past Medical History:  Diagnosis Date  . Non compliance w medication regimen   . Schizophrenia ([name])    History reviewed. No pertinent surgical history. Family History: History reviewed. No pertinent family history. Family Psychiatric  History: See admission H&P Social History:  Social History    Substance and Sexual Activity  Alcohol Use Yes   Comment: Refuses to disclose how much     Social History   Substance and Sexual Activity  Drug Use No   Comment: Refuses to answer    Social History   Socioeconomic History  . Marital status: Single    Spouse name: Not on file  . Number of children: Not on file  . Years of education: Not on file  . Highest education level: Not on file  Occupational History  . Not on file  Tobacco Use  . Smoking status: Current Some Day Smoker    Types: Cigarettes  . Smokeless tobacco: Never Used  Vaping Use  . Vaping Use: Every day  Substance and Sexual Activity  . Alcohol use: Yes    Comment: Refuses to disclose how much  . Drug use: No    Comment: Refuses to answer  . Sexual activity: Not on file    Comment: refused to answer  Other Topics Concern  . Not on file  Social History Narrative  . Not on file   Social Determinants of Health   Financial Resource Strain:   . Difficulty of Paying Living Expenses: Not on file  Food Insecurity:   . Worried About Charity fundraiser in the Last Year: Not on file  . Ran Out of Food in the Last Year: Not on file  Transportation Needs:   . Lack of Transportation (Medical): Not on file  . Lack of Transportation (Non-Medical): Not on file  Physical Activity:   . Days of Exercise per Week: Not on file  . Minutes of Exercise per Session:  Not on file  Stress:   . Feeling of Stress : Not on file  Social Connections:   . Frequency of Communication with Friends and Family: Not on file  . Frequency of Social Gatherings with Friends and Family: Not on file  . Attends Religious Services: Not on file  . Active Member of Clubs or Organizations: Not on file  . Attends Archivist Meetings: Not on file  . Marital Status: Not on file   Additional Social History:                         Sleep: Good  Appetite:  Poor  Current Medications: Current Facility-Administered  Medications  Medication Dose Route Frequency Provider Last Rate Last Admin  . acetaminophen (TYLENOL) tablet 650 mg  650 mg Oral Q6H PRN [name], MD      . alum & mag hydroxide-simeth (MAALOX/MYLANTA) 200-200-20 MG/5ML suspension 30 mL  30 mL Oral Q4H PRN [name], MD      . benztropine (COGENTIN) tablet 0.5 mg  0.5 mg Oral BID PRN [name], MD       Or  . benztropine mesylate (COGENTIN) injection 0.5 mg  0.5 mg Intramuscular BID PRN [name], MD   0.5 mg at 03/08/20 0817  . fluPHENAZine (PROLIXIN) injection 10 mg  10 mg Intramuscular TID [name], MD   10 mg at 03/10/20 1857  . fluPHENAZine (PROLIXIN) tablet 10 mg  10 mg Oral TID [name], MD      . hydrOXYzine (ATARAX/VISTARIL) tablet 25 mg  25 mg Oral TID PRN [name], MD      . magnesium hydroxide (MILK OF MAGNESIA) suspension 30 mL  30 mL Oral Daily PRN [name], MD      . paliperidone (INVEGA) 24 hr tablet 6 mg  6 mg Oral Daily [name], MD      . traZODone (DESYREL) tablet 50 mg  50 mg Oral QHS PRN [name], MD        Lab Results:  No results found for this or any previous visit (from the past 21 hour(s)).  Blood Alcohol level:  Lab Results  Component Value Date   ETH <10 02/29/2020   ETH <10 09/32/6712    Metabolic Disorder Labs: Lab Results  Component Value Date   HGBA1C 6.1 (H) 04/16/2015   MPG 128 04/16/2015   Lab Results  Component Value Date   PROLACTIN 13.6 04/16/2015   Lab Results  Component Value Date   CHOL 186 04/16/2015   TRIG 64 04/16/2015   HDL 55 04/16/2015   CHOLHDL 3.4 04/16/2015   VLDL 13 04/16/2015   LDLCALC 118 (H) 04/16/2015    Physical Findings: AIMS: Facial and Oral Movements Muscles of Facial Expression: None, normal Lips and Perioral Area: None, normal Jaw: None, normal Tongue: None, normal,Extremity Movements Upper (arms, wrists, hands, fingers): None, normal Lower (legs, knees,  ankles, toes): None, normal, Trunk Movements Neck, shoulders, hips: None, normal, Overall Severity Severity of abnormal movements (highest score from questions above): None, normal Incapacitation due to abnormal movements: None, normal Patient's awareness of abnormal movements (rate only patient's report): No Awareness, Dental Status Current problems with teeth and/or dentures?: No Does patient usually wear dentures?: No  CIWA:    COWS:     Musculoskeletal: Strength & Muscle Tone: within normal limits Gait & Station: normal Patient leans: [name]  Psychiatric Specialty  Exam: Physical Exam Vitals and nursing note reviewed.  Constitutional:      Comments: Thin  HENT:     Head: Normocephalic and atraumatic.     Nose: Nose normal.  Eyes:     Extraocular Movements: Extraocular movements intact.  Cardiovascular:     Rate and Rhythm: Normal rate.  Pulmonary:     Effort: Pulmonary effort is normal.  Musculoskeletal:        General: Normal range of motion.     Cervical back: Normal range of motion.  Neurological:     General: No focal deficit present.     Mental Status: She is alert.     Review of Systems  Unable to perform ROS: Psychiatric disorder  Patient refuses to answer  Blood pressure 97/68, pulse 72, temperature 98.6 F (37 C), temperature source Oral, resp. rate 18, SpO2 100 %.There is no height or weight on file to calculate BMI.  General Appearance: Fairly Groomed and Guarded  Eye Contact:  Minimal  Speech:  Normal Rate  Volume:  Normal  Mood:  Dysphoric and Irritable  Affect:  Labile  Thought Process:  Goal Directed and Descriptions of Associations: Loose  Orientation:  Negative  Thought Content:  Delusions, Paranoid Ideation and Rumination  Suicidal Thoughts:  No  Homicidal Thoughts:  No  Memory:  Immediate;   Poor Recent;   Poor Remote;   Poor  Judgement:  Impaired  Insight:  Lacking  Psychomotor Activity:  Increased  Concentration:  Concentration: Fair and  Attention Span: Fair  Recall:  [employer] of Knowledge:  Fair  Language:  Good  Akathisia:  Negative  Handed:  Right  AIMS (if indicated):     Assets:  Desire for Improvement Resilience  ADL's:  Intact  Cognition:  WNL  Sleep:  Number of Hours: 8.75     Treatment Plan Summary: Daily contact with patient to assess and evaluate symptoms and progress in treatment, Medication management and Plan : Patient is seen and examined.  Patient is a 59 year old female with the above-stated past psychiatric history who is seen in follow-up.   Patient received IV fluids on 03/07/2020 with production of some urine that was able to be sent for analysis.  She did not have a urinary tract infection.  Her blood pressure has been stable, without tachycardia.  She has not allowed an EKG.  She continues to refuse p.o. medication.  Diagnosis: 1. Schizophrenia 2. Anorexia basically secondary to her paranoia.  Pertinent findings on examination today: 1.  Patient schizophrenia is still very active and not responding to treatment at this point.  She continues to have a significant degree of paranoia, and this paranoia includes fear of food and liquids being poisoned by [place].  She has been accepting of bottled water.  I have asked nursing to encourage providing patient with prepackaged food.  2.  There is a note that has [name]. She was apparently there in February 2020.  We will attempt to get a hold of their records to see what medications were effective in being able to get her out of the [name] at that time. 5.  Patient is also been followed by ACTT service, possibly through the [place]. Social work will attempt to find out what services following her, and attempt to get more complete records.   Plan: 1.  Continue Cogentin 0.5 mg p.o. or IM twice daily but we will change that to as needed for tremor. 2.  Continue fluphenazine to  10 mg p.o. or IM 3 times daily for psychosis. 3.  Continue  hydroxyzine 25 mg p.o. 3 times daily as needed anxiety. 4.  Patient still has written paliperidone 6 mg p.o. nightly, but she is not taking any oral medications.  If she begins to take it this is for psychosis. 5.  Continue trazodone 50 mg p.o. nightly as needed insomnia. 6.  We will attempt to get information from her ACTT service. [name] records have been received and  reviewed.   7.  Disposition planning-in progress. 8.  Spoke with social work regarding transfer to [place] facility where she could be followed long-term with outpatient psychiatric services.  Social work will follow-up.   Lavella Hammock, MD 03/10/2020, 7:20 PM

## 2020-03-10 NOTE — Progress Notes (Signed)
[place] NOVEL CORONAVIRUS (COVID-19) DAILY CHECK-OFF SYMPTOMS - answer yes or no to each - every day NO YES  Have you had a fever in the past 24 hours?  . Fever (Temp > 37.80C / 100F) X   Have you had any of these symptoms in the past 24 hours? . New Cough .  Sore Throat  .  Shortness of Breath .  Difficulty Breathing .  Unexplained Body Aches   X   Have you had any one of these symptoms in the past 24 hours not related to allergies?   . Runny Nose .  Nasal Congestion .  Sneezing   X   If you have had runny nose, nasal congestion, sneezing in the past 24 hours, has it worsened?  X   EXPOSURES - check yes or no X   Have you traveled outside the state in the past 14 days?  X   Have you been in contact with someone with a confirmed diagnosis of COVID-19 or PUI in the past 14 days without wearing appropriate PPE?  X   Have you been living in the same home as a person with confirmed diagnosis of COVID-19 or a PUI (household contact)?    X   Have you been diagnosed with COVID-19?    X              What to do next: Answered NO to all: Answered YES to anything:   Proceed with unit schedule Follow the [facility] Inpatient Flowsheet.   

## 2020-03-11 MED ORDER — HALOPERIDOL LACTATE 5 MG/ML IJ SOLN
10.0000 mg | Freq: Three times a day (TID) | INTRAMUSCULAR | Status: DC
  Administered 2020-03-11 – 2020-03-13 (×5): 10 mg via INTRAMUSCULAR
  Filled 2020-03-11 (×11): qty 2

## 2020-03-11 MED ORDER — HALOPERIDOL DECANOATE 100 MG/ML IM SOLN
100.0000 mg | Freq: Once | INTRAMUSCULAR | Status: AC
  Administered 2020-03-12: 100 mg via INTRAMUSCULAR
  Filled 2020-03-11 (×2): qty 1

## 2020-03-11 MED ORDER — HALOPERIDOL 5 MG PO TABS
10.0000 mg | ORAL_TABLET | Freq: Three times a day (TID) | ORAL | Status: DC
  Filled 2020-03-11 (×8): qty 2

## 2020-03-11 NOTE — Progress Notes (Signed)
   03/11/20 2226  Psych Admission Type (Psych Patients Only)  Admission Status Involuntary  Psychosocial Assessment  Patient Complaints Depression;Isolation  Eye Contact Avoids  Facial Expression Flat  Affect Appropriate to circumstance  Speech Logical/coherent  Interaction Isolative  Motor Activity Slow  Appearance/Hygiene Unremarkable  Behavior Characteristics Appropriate to situation  Mood Depressed  Aggressive Behavior  Effect No apparent injury  Thought Process  Coherency Circumstantial  Content Paranoia  Delusions Paranoid  Perception Hallucinations  Hallucination None reported or observed  Judgment Impaired  Confusion Moderate  Danger to Self  Current suicidal ideation? Denies  Danger to Others  Danger to Others None reported or observed

## 2020-03-11 NOTE — Progress Notes (Signed)
   03/10/20 2100  Psych Admission Type (Psych Patients Only)  Admission Status Involuntary  Psychosocial Assessment  Patient Complaints None  Eye Contact Avoids  Facial Expression Flat  Affect Appropriate to circumstance  Speech Logical/coherent  Interaction Isolative  Motor Activity Slow  Appearance/Hygiene Unremarkable  Behavior Characteristics Appropriate to situation  Mood Preoccupied  Aggressive Behavior  Effect No apparent injury  Thought Process  Coherency Circumstantial  Content Paranoia  Delusions Paranoid  Perception Hallucinations  Hallucination None reported or observed  Judgment Impaired  Confusion Moderate  Danger to Self  Current suicidal ideation? Denies  Danger to Others  Danger to Others None reported or observed

## 2020-03-11 NOTE — [facility] Suicide Risk Assessment (Signed)
[facility] INPATIENT:  Family/Significant Other Suicide Prevention Education   Suicide Prevention Education: Education Completed; Sister/Guardian [name] Devincenzi-Lennon [phone]),  has been identified by the patient as the family member/significant other with whom the patient will be residing, and identified as the person(s) who will aid the patient in the event of a mental health crisis (suicidal ideations/suicide attempt).  With written consent from the patient, the family member/significant other has been provided the following suicide prevention education, prior to the and/or following the discharge of the patient.   Request made of family/significant other to:  Remove weapons (e.g., guns, rifles, knives), all items previously/currently identified as safety concern.     CSW spoke with this patients sister, who stated that this patient is currently residing with her mother and is able to return at discharge. Per sister there are no weapons in the home and she has no safety concerns for this patient when she discharges.  This patients sister also reported that this patient currently has ACTT services through [facility].    Ruthann Cancer MSW, LCSW Clincal Social Worker  [facility]

## 2020-03-11 NOTE — Progress Notes (Signed)
D:  Patient has been in her room most of the day.  Refuses to take medication orally.  Refused to eat but has drank several cups of water, ginger ale today. A:  Only IM medications will she accept.  Emotional support and encouragement given patient. R:  Safety maintained with 15 minute checks.

## 2020-03-11 NOTE — Progress Notes (Signed)
Recreation Therapy Notes  Date: 8.23.21 Time: 1000 Location: 500 Hall Dayroom  Group Topic: Coping Skills  Goal Area(s) Addresses:  Patient will identify positive coping skills. Patient will identify benefits of using positive coping skills post d/c.  Intervention: Worksheet, pencils  Activity: Healthy vs. Unhealthy Coping Strategies.  LRT went through some stretches and dance moves to get the group loosened up.  Patients were to then identify a problem they are currently dealing with.  Patients were to then identify unhealthy coping strategies they have used and the consequences of those strategies.  Patients then identified healthy coping strategies, expected outcomes and barriers to using them.  Education: Pharmacologist, Building control surveyor.   Education Outcome: Acknowledges understanding/In group clarification offered/Needs additional education.   Clinical Observations/Feedback: Pt did not attend group session.    Caroll Rancher, LRT/CTRS    Caroll Rancher A 03/11/2020 11:34 AM

## 2020-03-11 NOTE — Progress Notes (Signed)
[facility] MD Progress Note  03/11/2020 11:25 AM [name]  MRN:  [id number] Subjective:  Pt is seen and examined today. Pt is very irritable and focused on discharge today. Pt is not talking much and is angry and want to leave the hospital. She states that she is very unhappy to be here and want to go home. She stated that she doesn't want to eat anything from here as she doesn't eat processed food. Pt denies suicidal and homicidal ideation states " I didn't come here for mental illness, I am here for medical treatment". Pt denies visual and auditory hallucinations. She states "I just want to get the hell out of here, they trapped me here". Pt doesn't want to answer any more questions and states "I don't have the patience to answer your questions".  Nursing staff states that she hasn't been eating well. She slept for 8.75 hours. Nursing notes indicate that Pt continues to refuse food and po medications, but agrees to drink bottled water brought to her.  On examination- Pt is irritable, guarded and angry. Her thought process is Tangential focused on discharge. Pt is paranoid. Pt's mood is angry and irritable and affect is labile. Pt is not responding to internal stimuli. No SI, HI and AVH  Objective: Patient is a 59 year old female with a past psychiatric history significant for schizophrenia who was admitted on 03/02/2020 secondary to worsening psychotic symptoms and noncompliance with her schizophrenic medications.  Principal Problem: Paranoid schizophrenia (HCC) Diagnosis: Principal Problem:   Paranoid schizophrenia (HCC) Active Problems:   Noncompliance with diet and medication regimen  Total Time spent with patient: 20 minutes  Past Psychiatric History: See H&P  Past Medical History:  Past Medical History:  Diagnosis Date   Non compliance w medication regimen    Schizophrenia (HCC)    History reviewed. No pertinent surgical history. Family History: History reviewed. No pertinent family  history. Family Psychiatric  History: see H&P Social History:  Social History   Substance and Sexual Activity  Alcohol Use Yes   Comment: Refuses to disclose how much     Social History   Substance and Sexual Activity  Drug Use No   Comment: Refuses to answer    Social History   Socioeconomic History   Marital status: Single    Spouse name: Not on file   Number of children: Not on file   Years of education: Not on file   Highest education level: Not on file  Occupational History   Not on file  Tobacco Use   Smoking status: Current Some Day Smoker    Types: Cigarettes   Smokeless tobacco: Never Used  Vaping Use   Vaping Use: Every day  Substance and Sexual Activity   Alcohol use: Yes    Comment: Refuses to disclose how much   Drug use: No    Comment: Refuses to answer   Sexual activity: Not on file    Comment: refused to answer  Other Topics Concern   Not on file  Social History Narrative   Not on file   Social Determinants of Health   Financial Resource Strain:    Difficulty of Paying Living Expenses: Not on file  Food Insecurity:    Worried About Running Out of Food in the Last Year: Not on file   Ran Out of Food in the Last Year: Not on file  Transportation Needs:    Lack of Transportation (Medical): Not on file   Lack of Transportation (Non-Medical): Not  on file  Physical Activity:    Days of Exercise per Week: Not on file   Minutes of Exercise per Session: Not on file  Stress:    Feeling of Stress : Not on file  Social Connections:    Frequency of Communication with Friends and Family: Not on file   Frequency of Social Gatherings with Friends and Family: Not on file   Attends Religious Services: Not on file   Active Member of Clubs or Organizations: Not on file   Attends Banker Meetings: Not on file   Marital Status: Not on file   Additional Social History:                         Sleep:  Good  Appetite:  Poor  Current Medications: Current Facility-Administered Medications  Medication Dose Route Frequency Provider Last Rate Last Admin   acetaminophen (TYLENOL) tablet 650 mg  650 mg Oral Q6H PRN Antonieta Pert, MD       alum & mag hydroxide-simeth (MAALOX/MYLANTA) 200-200-20 MG/5ML suspension 30 mL  30 mL Oral Q4H PRN Antonieta Pert, MD       benztropine (COGENTIN) tablet 0.5 mg  0.5 mg Oral BID PRN Antonieta Pert, MD       Or   benztropine mesylate (COGENTIN) injection 0.5 mg  0.5 mg Intramuscular BID PRN Antonieta Pert, MD   0.5 mg at 03/08/20 8127   fluPHENAZine (PROLIXIN) injection 10 mg  10 mg Intramuscular TID Antonieta Pert, MD   10 mg at 03/11/20 0730   fluPHENAZine (PROLIXIN) tablet 10 mg  10 mg Oral TID Antonieta Pert, MD       hydrOXYzine (ATARAX/VISTARIL) tablet 25 mg  25 mg Oral TID PRN Antonieta Pert, MD       magnesium hydroxide (MILK OF MAGNESIA) suspension 30 mL  30 mL Oral Daily PRN Antonieta Pert, MD       paliperidone (INVEGA) 24 hr tablet 6 mg  6 mg Oral Daily Antonieta Pert, MD       traZODone (DESYREL) tablet 50 mg  50 mg Oral QHS PRN Antonieta Pert, MD        Lab Results: No results found for this or any previous visit (from the past 48 hour(s)).  Blood Alcohol level:  Lab Results  Component Value Date   ETH <10 02/29/2020   ETH <10 10/04/2019    Metabolic Disorder Labs: Lab Results  Component Value Date   HGBA1C 6.1 (H) 04/16/2015   MPG 128 04/16/2015   Lab Results  Component Value Date   PROLACTIN 13.6 04/16/2015   Lab Results  Component Value Date   CHOL 186 04/16/2015   TRIG 64 04/16/2015   HDL 55 04/16/2015   CHOLHDL 3.4 04/16/2015   VLDL 13 04/16/2015   LDLCALC 118 (H) 04/16/2015    Physical Findings: AIMS: Facial and Oral Movements Muscles of Facial Expression: None, normal Lips and Perioral Area: None, normal Jaw: None, normal Tongue: None, normal,Extremity  Movements Upper (arms, wrists, hands, fingers): None, normal Lower (legs, knees, ankles, toes): None, normal, Trunk Movements Neck, shoulders, hips: None, normal, Overall Severity Severity of abnormal movements (highest score from questions above): None, normal Incapacitation due to abnormal movements: None, normal Patient's awareness of abnormal movements (rate only patient's report): No Awareness, Dental Status Current problems with teeth and/or dentures?: No Does patient usually wear dentures?: No  CIWA:    COWS:  Musculoskeletal: Strength & Muscle Tone: within normal limits Gait & Station: normal Patient leans: [name]  Psychiatric Specialty Exam: Physical Exam Vitals and nursing note reviewed.  Constitutional:      General: She is not in acute distress.    Appearance: Normal appearance. She is not ill-appearing, toxic-appearing or diaphoretic.  HENT:     Head: Normocephalic and atraumatic.  Neurological:     Mental Status: She is alert.     Review of Systems Pt uncooperative, couldn't complete.  Blood pressure (!) 78/63, pulse 93, temperature 97.9 F (36.6 C), temperature source Oral, resp. rate 18, SpO2 100 %.There is no height or weight on file to calculate BMI.  General Appearance: Casual and Guarded  Eye Contact:  Minimal  Speech:  Normal Rate  Volume:  Normal  Mood:  Angry and Irritable  Affect:  Labile  Thought Process:  Descriptions of Associations: Tangential  Orientation:  Negative  Thought Content:  Delusions, Paranoid Ideation, Rumination and Tangential  Suicidal Thoughts:  No  Homicidal Thoughts:  No  Memory:  Immediate;   Poor Recent;   Poor Remote;   Poor  Judgement:  Impaired  Insight:  Lacking  Psychomotor Activity:  Increased  Concentration:  Concentration: Fair and Attention Span: Fair  Recall:  Poor  Fund of Knowledge:  Fair  Language:  Fair  Akathisia:  Negative  Handed:  Right  AIMS (if indicated):     Assets:  Desire for  Improvement Resilience  ADL's:  Intact  Cognition:  WNL  Sleep:  Number of Hours: 8.75     Treatment Plan Summary:Patient is a 59 year old female with a past psychiatric history significant for schizophrenia who was admitted on 03/02/2020 secondary to worsening psychotic symptoms and noncompliance with her schizophrenic medications. Today , Pt is irritable, guarded and angry. Her thought process is Tangential focused on discharge. Pt is paranoid. Pt's mood is angry and irritable and affect is labile. Pt is not responding to internal stimuli. No SI, HI and AVH Pt is not taking oral medications and not eating food. Pt is drinking water from bottle. Pt has severe paranoia and thinks that she will be poisoned by [place] through food. Pt is avoiding food and oral medications due to paranoia.   Dx- Schizophrenia         Anorexia basically secondary to her paranoia.  BP- 78/63 mmHg, PR- 93/min Labs- Urinalysis- Ketones +ve 80, Protein-30 WBC- 0-5, Hyaline casts present Glucose- 87 , Hb- 16.8, RBC- 5.93  Na- 149, K- 4.2, Alc level <10 No new labs Plan-   Daily contact with patient to assess and evaluate symptoms and progress in treatment   1.  Continue Cogentin 0.5 mg p.o. or IM twice daily PRN For tremors. 2.  Continue fluphenazine to 10 mg p.o. or IM 3 times daily for psychosis. 3.  Continue hydroxyzine 25 mg p.o. 3 times daily as needed anxiety. 4.  Continue paliperidone 6 mg p.o. nightly, but she is not taking any oral medications right now.  5.  Continue Trazodone 50 mg p.o. nightly as needed insomnia. 6.  We will attempt to get information from her ACTT service. 7.  Disposition planning-in progress. 8.  CSW to find out option regarding transfer her to [place] facility where she could be followed long-term with outpatient psychiatric services.   9. Do EKG if pt is cooperative.   Karsten Ro, MD 03/11/2020, 11:25 AM

## 2020-03-12 NOTE — Progress Notes (Signed)
Adult Psychoeducational Group Note  Date:  03/12/2020 Time:  11:04 PM  Group Topic/Focus:  Wrap-Up Group:   The focus of this group is to help patients review their daily goal of treatment and discuss progress on daily workbooks.  Participation Level:  Did Not Attend  Participation Quality:  Did Not Attend  Affect:  Did Not Attend  Cognitive:  Did Not Attend  Insight: None  Engagement in Group:  Did Not Attend  Modes of Intervention:  Did Not Attend  Additional Comments:  Pt did not attend evening wrap up group tonight.  Felipa Furnace 03/12/2020, 11:04 PM

## 2020-03-12 NOTE — Progress Notes (Signed)
[facility]  03/12/2020 12:18 PM [name]  MRN:  [id number] Subjective:  Pt is seen and examined today. Pt is irritable and focused on discharge. Pt states she just wants to go home. Pt states "I am just here and you have to send me home soon". Pt hasn't been eating anything. Pt states she is not going to eat anything because everything is processed here and she will go home and eat. She states she has to force food down. She slept last night. Nursing notes indicate that Pt slept for 8.75 hours. Currently, Pt denies any suicidal ideation, homicidal ideation and, visual and auditory hallucination. Pt denies any headache, nausea, vomiting, dizziness, chest pain, SOB, abdominal pain, diarrhea, and constipation. Pt has not been taking her oral medications. Nursing staff states that she hasn't been eating well. Pt did not attend group session yesterday. Nursing notes indicate that Patient had been in her room most of the day yesterday.  Refused to take medication orally.  Refused to eat but has drank several cups of water, ginger ale. CSW spoke with this patients sister, who stated that this patient is currently residing with her mother and is able to return at discharge.This patients sister also reported that this patient currently has ACTT services through [facility].   On examination- Pt is irritable and guarded. Her thought process is Tangential focused on discharge. Pt is paranoid. Pt's mood is irritable and affect is labile. Pt is not responding to internal stimuli. No SI, HI and AVH  Objective: Patient is a 59 year old female with a past psychiatric history significant for schizophrenia who was admitted on 03/02/2020 secondary to worsening psychotic symptoms and noncompliance with her schizophrenic medications.  Principal Problem: Paranoid schizophrenia (HCC) Diagnosis: Principal Problem:   Paranoid schizophrenia (HCC) Active Problems:   Noncompliance with diet and  medication regimen  Total Time spent with patient: 20 minutes  Past Psychiatric History: see H&P  Past Medical History:  Past Medical History:  Diagnosis Date   Non compliance w medication regimen    Schizophrenia (HCC)    History reviewed. No pertinent surgical history. Family History: History reviewed. No pertinent family history. Family Psychiatric  History: see H&P Social History:  Social History   Substance and Sexual Activity  Alcohol Use Yes   Comment: Refuses to disclose how much     Social History   Substance and Sexual Activity  Drug Use No   Comment: Refuses to answer    Social History   Socioeconomic History   Marital status: Single    Spouse name: Not on file   Number of children: Not on file   Years of education: Not on file   Highest education level: Not on file  Occupational History   Not on file  Tobacco Use   Smoking status: Current Some Day Smoker    Types: Cigarettes   Smokeless tobacco: Never Used  Vaping Use   Vaping Use: Every day  Substance and Sexual Activity   Alcohol use: Yes    Comment: Refuses to disclose how much   Drug use: No    Comment: Refuses to answer   Sexual activity: Not on file    Comment: refused to answer  Other Topics Concern   Not on file  Social History Narrative   Not on file   Social Determinants of Health   Financial Resource Strain:    Difficulty of Paying Living Expenses: Not on file  Food Insecurity:    Worried  About Running Out of Food in the Last Year: Not on file   Ran Out of Food in the Last Year: Not on file  Transportation Needs:    Lack of Transportation (Medical): Not on file   Lack of Transportation (Non-Medical): Not on file  Physical Activity:    Days of Exercise per Week: Not on file   Minutes of Exercise per Session: Not on file  Stress:    Feeling of Stress : Not on file  Social Connections:    Frequency of Communication with Friends and Family: Not on file    Frequency of Social Gatherings with Friends and Family: Not on file   Attends Religious Services: Not on file   Active Member of Clubs or Organizations: Not on file   Attends Banker Meetings: Not on file   Marital Status: Not on file   Additional Social History:                         Sleep: Good  Appetite:  Poor  Current Medications: Current Facility-Administered Medications  Medication Dose Route Frequency Provider Last Rate Last Admin   acetaminophen (TYLENOL) tablet 650 mg  650 mg Oral Q6H PRN Antonieta Pert, MD       alum & mag hydroxide-simeth (MAALOX/MYLANTA) 200-200-20 MG/5ML suspension 30 mL  30 mL Oral Q4H PRN Antonieta Pert, MD       benztropine (COGENTIN) tablet 0.5 mg  0.5 mg Oral BID PRN Antonieta Pert, MD       Or   benztropine mesylate (COGENTIN) injection 0.5 mg  0.5 mg Intramuscular BID PRN Antonieta Pert, MD   0.5 mg at 03/08/20 0350   haloperidol (HALDOL) tablet 10 mg  10 mg Oral TID Antonieta Pert, MD       Or   haloperidol lactate (HALDOL) injection 10 mg  10 mg Intramuscular TID Antonieta Pert, MD   10 mg at 03/12/20 0938   hydrOXYzine (ATARAX/VISTARIL) tablet 25 mg  25 mg Oral TID PRN Antonieta Pert, MD       magnesium hydroxide (MILK OF MAGNESIA) suspension 30 mL  30 mL Oral Daily PRN Antonieta Pert, MD       paliperidone (INVEGA) 24 hr tablet 6 mg  6 mg Oral Daily Antonieta Pert, MD       traZODone (DESYREL) tablet 50 mg  50 mg Oral QHS PRN Antonieta Pert, MD        Lab Results: No results found for this or any previous visit (from the past 48 hour(s)).  Blood Alcohol level:  Lab Results  Component Value Date   ETH <10 02/29/2020   ETH <10 10/04/2019    Metabolic Disorder Labs: Lab Results  Component Value Date   HGBA1C 6.1 (H) 04/16/2015   MPG 128 04/16/2015   Lab Results  Component Value Date   PROLACTIN 13.6 04/16/2015   Lab Results  Component Value Date    CHOL 186 04/16/2015   TRIG 64 04/16/2015   HDL 55 04/16/2015   CHOLHDL 3.4 04/16/2015   VLDL 13 04/16/2015   LDLCALC 118 (H) 04/16/2015    Physical Findings: AIMS: Facial and Oral Movements Muscles of Facial Expression: None, normal Lips and Perioral Area: None, normal Jaw: None, normal Tongue: None, normal,Extremity Movements Upper (arms, wrists, hands, fingers): None, normal Lower (legs, knees, ankles, toes): None, normal, Trunk Movements Neck, shoulders, hips: None, normal, Overall Severity Severity  of abnormal movements (highest score from questions above): None, normal Incapacitation due to abnormal movements: None, normal Patient's awareness of abnormal movements (rate only patient's report): No Awareness, Dental Status Current problems with teeth and/or dentures?: No Does patient usually wear dentures?: No  CIWA:    COWS:     Musculoskeletal: Strength & Muscle Tone: within normal limits Gait & Station: normal Patient leans: [name]  Psychiatric Specialty Exam: Physical Exam Vitals and nursing note reviewed.  Constitutional:      General: She is not in acute distress.    Appearance: Normal appearance. She is not ill-appearing, toxic-appearing or diaphoretic.  HENT:     Head: Normocephalic and atraumatic.  Pulmonary:     Effort: Pulmonary effort is normal.  Neurological:     Mental Status: She is alert.     Review of Systems  Constitutional: Negative for activity change and fever.  Respiratory: Negative for chest tightness and shortness of breath.   Cardiovascular: Negative for chest pain and palpitations.  Gastrointestinal: Negative for abdominal pain, constipation, diarrhea, nausea and vomiting.  Neurological: Negative for light-headedness and headaches.  Psychiatric/Behavioral: Positive for dysphoric mood. Negative for hallucinations and suicidal ideas.    Blood pressure (!) 72/52, pulse (!) 104, temperature 97.9 F (36.6 C), temperature source Oral, resp.  rate 18, SpO2 100 %.There is no height or weight on file to calculate BMI.  General Appearance: Casual and Guarded  Eye Contact:  Minimal  Speech:  Normal Rate  Volume:  Normal  Mood:  Irritable  Affect:  Labile  Thought Process:  Descriptions of Associations: Tangential  Orientation:  Full (Time, Place, and Person)  Thought Content:  Delusions, Paranoid Ideation, Rumination and Tangential  Suicidal Thoughts:  No  Homicidal Thoughts:  No  Memory:  Immediate;   Poor Recent;   Poor Remote;   Poor  Judgement:  Impaired  Insight:  Lacking  Psychomotor Activity:  Increased  Concentration:  Concentration: Fair and Attention Span: Fair  Recall:  Poor  Fund of Knowledge:  Fair  Language:  Fair  Akathisia:  Negative  Handed:  Right  AIMS (if indicated):     Assets:  Desire for Improvement Resilience  ADL's:  Intact  Cognition:  WNL  Sleep:  Number of Hours: 8.75     Treatment Plan Summary: Patient is a 59 year old female with a past psychiatric history significant for schizophrenia who was admitted on 03/02/2020 secondary to worsening psychotic symptoms and noncompliance with her schizophrenic medications. Pt is irritable and guarded. Her thought process is Tangential focused on discharge. Pt is paranoid. Pt's mood is irritable and affect is labile. Pt is not responding to internal stimuli. No SI, HI and AVH BP- 72/69mmHg, PR- 104/min Fluphenazine was stopped yesterday and Haldol 10 mg TID was started.  Pt received 1 dose of Haldol Decanoate yesterday. She will receive another one today. Plan-  Daily contact with patient to assess and evaluate symptoms and progress in treatment. -Send patient to ED for IV fluids when Bed is available. Bed was not available in this AM so couldn't send her over to ED. -Continue Haldol 10mg  TID orally or IM -Pt received 1 dose of Haldol Decanoate yesterday. She will receive another one today after she gets IV Fluids and her BP is normal. -Continue  Cogentin 0.5 mg BID PRN. -Continue hydroxyzine 25 mg p.o. 3 times daily as needed anxiety. -Continue paliperidone 6 mg p.o. nightly, but she is not taking any oral medications right now.  -Continue Trazodone 50  mg p.o. nightly as needed insomnia. -Disposition planning-in progress. - Do EKG if pt is cooperative.  Karsten Ro, MD 03/12/2020, 12:18 PM

## 2020-03-13 LAB — COMPREHENSIVE METABOLIC PANEL
ALT: 12 U/L (ref 0–44)
AST: 22 U/L (ref 15–41)
Albumin: 3.2 g/dL — ABNORMAL LOW (ref 3.5–5.0)
Alkaline Phosphatase: 44 U/L (ref 38–126)
Anion gap: 15 (ref 5–15)
BUN: 10 mg/dL (ref 6–20)
CO2: 15 mmol/L — ABNORMAL LOW (ref 22–32)
Calcium: 7.4 mg/dL — ABNORMAL LOW (ref 8.9–10.3)
Chloride: 104 mmol/L (ref 98–111)
Creatinine, Ser: 0.71 mg/dL (ref 0.44–1.00)
GFR calc Af Amer: >60 mL/min (ref >60)
GFR calc non Af Amer: >60 mL/min (ref >60)
Glucose, Bld: 61 mg/dL — ABNORMAL LOW (ref 70–99)
Potassium: 3.7 mmol/L (ref 3.5–5.1)
Sodium: 134 mmol/L — ABNORMAL LOW (ref 135–145)
Total Bilirubin: 1.2 mg/dL (ref 0.3–1.2)
Total Protein: 6.1 g/dL — ABNORMAL LOW (ref 6.5–8.1)

## 2020-03-13 LAB — CBC
HCT: 33.9 % — ABNORMAL LOW (ref 36.0–46.0)
Hemoglobin: 11.2 g/dL — ABNORMAL LOW (ref 12.0–15.0)
MCH: 28.8 pg (ref 26.0–34.0)
MCHC: 33 g/dL (ref 30.0–36.0)
MCV: 87.1 fL (ref 80.0–100.0)
Platelets: 137 10*3/uL — ABNORMAL LOW (ref 150–400)
RBC: 3.89 MIL/uL (ref 3.87–5.11)
RDW: 12.3 % (ref 11.5–15.5)
WBC: 4.9 10*3/uL (ref 4.0–10.5)
nRBC: 0 % (ref 0.0–0.2)

## 2020-03-13 LAB — CK: Total CK: 201 U/L (ref 38–234)

## 2020-03-13 LAB — CBG MONITORING, ED
Glucose-Capillary: 62 mg/dL — ABNORMAL LOW (ref 70–99)
Glucose-Capillary: 244 mg/dL — ABNORMAL HIGH (ref 70–99)

## 2020-03-13 MED ORDER — DEXTROSE-NACL 2.5-0.45 % IV SOLN: INTRAVENOUS | Status: DC

## 2020-03-13 MED ORDER — DEXTROSE 50 % IV SOLN
25.0000 g | Freq: Once | INTRAVENOUS | Status: AC
  Administered 2020-03-13: 25 g via INTRAVENOUS
  Filled 2020-03-13: qty 50

## 2020-03-13 MED ORDER — GLUCOSE 40 % PO GEL
1.0000 | Freq: Once | ORAL | Status: AC
  Administered 2020-03-13: 37.5 g via ORAL
  Filled 2020-03-13: qty 1

## 2020-03-13 MED ORDER — DEXTROSE-NACL 5-0.45 % IV SOLN: INTRAVENOUS | Status: DC

## 2020-03-13 MED ORDER — SODIUM CHLORIDE 0.9 % IV BOLUS
2000.0000 mL | Freq: Once | INTRAVENOUS | Status: AC
  Administered 2020-03-13: 2000 mL via INTRAVENOUS

## 2020-03-13 NOTE — ED Notes (Signed)
Offered patient meal tray. Patient refused.

## 2020-03-13 NOTE — ED Notes (Signed)
Rounded on pt. Pt has no complaints at this time. Pt took oral glucose without issue.

## 2020-03-13 NOTE — ED Triage Notes (Signed)
Pt sent over from [facility] for IV fluids due to not eating. Sitter with patient.

## 2020-03-13 NOTE — ED Provider Notes (Signed)
[facility]-EMERGENCY DEPT Provider Note   CSN: [id number] Arrival date & time: 03/06/20  0932     History Chief Complaint  Patient presents with  . Schizophrenia    [name] is a 59 y.o. female.  HPI    Patient presents as a transfer from our behavioral health facility with staff concerns for dehydration. The patient self denies abdominal pain, nausea, vomiting.  She states that she feels somewhat weak in general, but not focally. She notes that she has not been eating or drinking in almost 9 days due to dietary concerns.  She notes that she will only eat" grocery store food" as her digestive track is fragile. She is here with a staff member who assists with additional details of the HPI. Additional further details provided by chart review. Some limitation of the history secondary to the patient's known history of schizophrenia, level 5 caveat. Past Medical History:  Diagnosis Date  . Non compliance w medication regimen   . Schizophrenia [facility])     Patient Active Problem List   Diagnosis Date Noted  . Paranoid schizophrenia (HCC) 03/01/2020  . Schizophrenia (HCC) 11/26/2017  . Noncompliance with diet and medication regimen   . Psychoses (HCC)   . Hypokalemic alkalosis 11/11/2011  . Medically noncompliant 11/11/2011  . Schizophrenia, paranoid (HCC) 11/07/2011    History reviewed. No pertinent surgical history.   OB History   No obstetric history on file.     History reviewed. No pertinent family history.  Social History   Tobacco Use  . Smoking status: Current Some Day Smoker    Types: Cigarettes  . Smokeless tobacco: Never Used  Vaping Use  . Vaping Use: Every day  Substance Use Topics  . Alcohol use: Yes    Comment: Refuses to disclose how much  . Drug use: No    Comment: Refuses to answer    Home Medications Prior to Admission medications   Not on File    Allergies    Aspirin and Geodon [ziprasidone hcl]  Review of  Systems   Review of Systems  Unable to perform ROS: Psychiatric disorder    Physical Exam Updated Vital Signs BP (!) 78/63 (BP Location: Right Arm)   Pulse 98   Temp 98 F (36.7 C) (Oral)   Resp 18   SpO2 100%   Physical Exam Vitals and nursing note reviewed.  Constitutional:      Appearance: She is well-developed.     Comments: Thin adult female sitting upright speaking slowly, with a delayed affect  HENT:     Head: Normocephalic and atraumatic.  Eyes:     Conjunctiva/sclera: Conjunctivae normal.  Cardiovascular:     Rate and Rhythm: Normal rate and regular rhythm.     Pulses: Normal pulses.  Pulmonary:     Effort: Pulmonary effort is normal. No respiratory distress.     Breath sounds: No stridor.  Abdominal:     General: There is no distension.  Skin:    General: Skin is warm and dry.  Neurological:     Mental Status: She is alert.     Cranial Nerves: No cranial nerve deficit.     Motor: Atrophy present.  Psychiatric:        Behavior: Behavior is slowed and withdrawn.        Cognition and Memory: Cognition is impaired.     ED Results / Procedures / Treatments   Labs (all labs ordered are listed, but only abnormal results are  displayed) Labs Reviewed  URINALYSIS, COMPLETE (UACMP) WITH MICROSCOPIC - Abnormal; Notable for the following components:      Result Value   Hgb urine dipstick MODERATE (*)    Ketones, ur 80 (*)    Protein, ur 30 (*)    Leukocytes,Ua LARGE (*)    All other components within normal limits  CBC WITH DIFFERENTIAL/PLATELET - Abnormal; Notable for the following components:   RBC 5.93 (*)    Hemoglobin 16.8 (*)    HCT 51.9 (*)    Lymphs Abs 0.6 (*)    All other components within normal limits  BASIC METABOLIC PANEL - Abnormal; Notable for the following components:   CO2 16 (*)    Glucose, Bld 114 (*)    BUN 33 (*)    Creatinine, Ser 1.31 (*)    GFR calc non Af Amer 44 (*)    GFR calc Af Amer 52 (*)    Anion gap 20 (*)    All other  components within normal limits  BASIC METABOLIC PANEL - Abnormal; Notable for the following components:   Sodium 149 (*)    Chloride 119 (*)    CO2 15 (*)    BUN 24 (*)    Calcium 7.3 (*)    All other components within normal limits  URINALYSIS, COMPLETE (UACMP) WITH MICROSCOPIC - Abnormal; Notable for the following components:   Hgb urine dipstick MODERATE (*)    Ketones, ur 80 (*)    Protein, ur 30 (*)    All other components within normal limits  TSH    Procedures Procedures (including critical care time)  Medications Ordered in ED Medications  acetaminophen (TYLENOL) tablet 650 mg (has no administration in time range)  alum & mag hydroxide-simeth (MAALOX/MYLANTA) 200-200-20 MG/5ML suspension 30 mL (has no administration in time range)  hydrOXYzine (ATARAX/VISTARIL) tablet 25 mg (has no administration in time range)  magnesium hydroxide (MILK OF MAGNESIA) suspension 30 mL (has no administration in time range)  traZODone (DESYREL) tablet 50 mg (has no administration in time range)  paliperidone (INVEGA) 24 hr tablet 6 mg (6 mg Oral Not Given 03/13/20 0744)  benztropine (COGENTIN) tablet 0.5 mg ( Oral See Alternative 03/08/20 0817)    Or  benztropine mesylate (COGENTIN) injection 0.5 mg (0.5 mg Intramuscular Given 03/08/20 0817)  haloperidol (HALDOL) tablet 10 mg (0 mg Oral Hold 03/13/20 0743)    Or  haloperidol lactate (HALDOL) injection 10 mg ( Intramuscular See Alternative 03/13/20 0743)  sodium chloride 0.9 % bolus 2,000 mL (has no administration in time range)  paliperidone (INVEGA SUSTENNA) injection 234 mg (234 mg Intramuscular Given 03/02/20 0859)  sodium chloride 0.9 % bolus 1,000 mL ( Intravenous Stopped 03/06/20 1058)  sodium chloride 0.9 % bolus 1,000 mL ( Intravenous Stopped 03/06/20 1216)  haloperidol decanoate (HALDOL DECANOATE) 100 MG/ML injection 100 mg (100 mg Intramuscular Given 03/12/20 3267)    ED Course  I have reviewed the triage vital signs and the nursing  notes.  Pertinent labs & imaging results that were available during my care of the patient were reviewed by me and considered in my medical decision making (see chart for details).  After initial evaluation reviewed patient chart.  Most recent labs notable for ketonuria, mild hypernatremia.  Given concern for mild dehydration, acknowledging patient's description of diminished p.o. intake, she received IV fluids.  1:42 PM Labs reassuring  Following completion of IV fluids, absent other complaints, evidence for distress, the patient is appropriate to return to our  behavioral health facility.  Final Clinical Impression(s) / ED Diagnoses Final diagnoses:  Schizophrenia, unspecified type (HCC)  Dehydration     Gerhard Munch, MD 03/13/20 1346

## 2020-03-13 NOTE — BHH Counselor (Signed)
CSW faxed state hospital referral to [facility] and completed the verbal screening.    Ruthann Cancer MSW, LCSW Clincal Social Worker  [facility]

## 2020-03-13 NOTE — ED Notes (Signed)
Pt coming over from [facility] adult unit. [facility] RN states pt hasn't been eating or drinking since admitted 2 weeks ago. Pt sent over last Thursday for fluids and patient still continues to not eat or drink but drinks a bottle of water every now and then. [facility] plans to get BP up to give Haldol and discharge from [facility].  Per [facility] RN there MD wants a CMP and CBC.

## 2020-03-13 NOTE — ED Notes (Signed)
Offered patient juice for low sugar. Patient refused stating it gives her heart burn. EDP made aware.

## 2020-03-13 NOTE — ED Notes (Signed)
pts recent cbg 62. Pt is refusing oral meds and oral hydration at this time. Provider made aware to consider alternate treatment option

## 2020-03-13 NOTE — ED Provider Notes (Signed)
10:15 PM-I have been contacted by several different nurses regarding this patient's current status.  It appears that she was dispositioned, prior to 3 PM today to return back to behavioral health after an evaluation for possible dehydration, and not eating.  Final lab test earlier today, indicated that she was hypoglycemic.  At that time she apparently refused nutrition.  Later around 530, the nurse got her to take some oral glucose.  The CBG is checked again, and found to be low, a couple of minutes ago.  I asked the nurse to give half amp of D50, and start dextrose solution.  10:30 PM-case discussed with Arvil Persons, behavioral health nurse practitioner.  He will look into the case and recontact me regarding plans for treating this patient who appears to be not eating due to her psychiatric condition.   Mancel Bale, MD 03/14/20 [phone]

## 2020-03-14 ENCOUNTER — Observation Stay (HOSPITAL_BASED_OUTPATIENT_CLINIC_OR_DEPARTMENT_OTHER)
Admission: EM | Admit: 2020-03-14 | Discharge: 2020-03-15 | Disposition: A | Discharge status: Home / Self Care | Payer: Medicare Other | Source: Ambulatory Visit | Admitting: Internal Medicine

## 2020-03-14 ENCOUNTER — Encounter (HOSPITAL_COMMUNITY): Payer: Self-pay | Admitting: Internal Medicine

## 2020-03-14 ENCOUNTER — Other Ambulatory Visit: Payer: Self-pay

## 2020-03-14 DIAGNOSIS — Z9114 Patient's other noncompliance with medication regimen: Secondary | ICD-10-CM | POA: Diagnosis not present

## 2020-03-14 DIAGNOSIS — E162 Hypoglycemia, unspecified: Secondary | ICD-10-CM | POA: Diagnosis present

## 2020-03-14 DIAGNOSIS — Z9111 Patient's noncompliance with dietary regimen: Secondary | ICD-10-CM

## 2020-03-14 DIAGNOSIS — F200 Paranoid schizophrenia: Secondary | ICD-10-CM | POA: Diagnosis not present

## 2020-03-14 DIAGNOSIS — Z91148 Patient's other noncompliance with medication regimen for other reason: Secondary | ICD-10-CM

## 2020-03-14 LAB — CREATININE, SERUM
Creatinine, Ser: 0.62 mg/dL (ref 0.44–1.00)
GFR calc Af Amer: >60 mL/min (ref >60)
GFR calc non Af Amer: >60 mL/min (ref >60)

## 2020-03-14 LAB — CBC
HCT: 34.1 % — ABNORMAL LOW (ref 36.0–46.0)
Hemoglobin: 11.7 g/dL — ABNORMAL LOW (ref 12.0–15.0)
MCH: 28.7 pg (ref 26.0–34.0)
MCHC: 34.3 g/dL (ref 30.0–36.0)
MCV: 83.6 fL (ref 80.0–100.0)
Platelets: 164 10*3/uL (ref 150–400)
RBC: 4.08 MIL/uL (ref 3.87–5.11)
RDW: 12.2 % (ref 11.5–15.5)
WBC: 5.8 10*3/uL (ref 4.0–10.5)
nRBC: 0 % (ref 0.0–0.2)

## 2020-03-14 LAB — CBG MONITORING, ED
Glucose-Capillary: 146 mg/dL — ABNORMAL HIGH (ref 70–99)
Glucose-Capillary: 101 mg/dL — ABNORMAL HIGH (ref 70–99)
Glucose-Capillary: 78 mg/dL (ref 70–99)
Glucose-Capillary: 70 mg/dL (ref 70–99)
Glucose-Capillary: 163 mg/dL — ABNORMAL HIGH (ref 70–99)
Glucose-Capillary: 114 mg/dL — ABNORMAL HIGH (ref 70–99)
Glucose-Capillary: 111 mg/dL — ABNORMAL HIGH (ref 70–99)

## 2020-03-14 LAB — GLUCOSE, CAPILLARY: Glucose-Capillary: 115 mg/dL — ABNORMAL HIGH (ref 70–99)

## 2020-03-14 LAB — HEMOGLOBIN A1C
Hgb A1c MFr Bld: 5.7 % — ABNORMAL HIGH (ref 4.8–5.6)
Mean Plasma Glucose: 116.89 mg/dL

## 2020-03-14 LAB — HIV ANTIBODY (ROUTINE TESTING W REFLEX): HIV Screen 4th Generation wRfx: NONREACTIVE

## 2020-03-14 LAB — SARS CORONAVIRUS 2 BY RT PCR (HOSPITAL ORDER, PERFORMED IN ~~LOC~~ HOSPITAL LAB): SARS Coronavirus 2: NEGATIVE

## 2020-03-14 MED ORDER — DEXTROSE 50 % IV SOLN
50.0000 mL | Freq: Once | INTRAVENOUS | Status: AC
  Administered 2020-03-14: 50 mL via INTRAVENOUS
  Filled 2020-03-14: qty 50

## 2020-03-14 MED ORDER — ACETAMINOPHEN 325 MG PO TABS: 650.0000 mg | ORAL_TABLET | Freq: Four times a day (QID) | ORAL | Status: DC | PRN

## 2020-03-14 MED ORDER — HALOPERIDOL LACTATE 5 MG/ML IJ SOLN
10.0000 mg | Freq: Three times a day (TID) | INTRAMUSCULAR | Status: DC
  Administered 2020-03-14 – 2020-03-15 (×3): 10 mg via INTRAMUSCULAR
  Filled 2020-03-14 (×3): qty 2

## 2020-03-14 MED ORDER — HALOPERIDOL 5 MG PO TABS
10.0000 mg | ORAL_TABLET | Freq: Three times a day (TID) | ORAL | Status: DC
  Filled 2020-03-14: qty 2

## 2020-03-14 MED ORDER — HALOPERIDOL 5 MG PO TABS: 10.0000 mg | ORAL_TABLET | Freq: Three times a day (TID) | ORAL | Status: DC

## 2020-03-14 MED ORDER — DEXTROSE-NACL 5-0.9 % IV SOLN: Freq: Once | INTRAVENOUS | Status: AC

## 2020-03-14 MED ORDER — HYDROXYZINE HCL 25 MG PO TABS: 25.0000 mg | ORAL_TABLET | Freq: Three times a day (TID) | ORAL | Status: DC | PRN

## 2020-03-14 MED ORDER — ACETAMINOPHEN 650 MG RE SUPP: 650.0000 mg | Freq: Four times a day (QID) | RECTAL | Status: DC | PRN

## 2020-03-14 MED ORDER — BENZTROPINE MESYLATE 0.5 MG PO TABS: 0.5000 mg | ORAL_TABLET | Freq: Two times a day (BID) | ORAL | Status: DC | PRN

## 2020-03-14 MED ORDER — ENOXAPARIN SODIUM 40 MG/0.4ML ~~LOC~~ SOLN
40.0000 mg | Freq: Every day | SUBCUTANEOUS | Status: DC
  Administered 2020-03-14: 40 mg via SUBCUTANEOUS
  Filled 2020-03-14 (×2): qty 0.4

## 2020-03-14 NOTE — Progress Notes (Signed)
CSW received call from [facility] stating that this patient has been placed on their wait list.    Michaline Kindig MSW, LCSW Clincal Social Worker  [facility]  

## 2020-03-14 NOTE — ED Notes (Signed)
Patient refused OJ or soda. MD made aware.

## 2020-03-14 NOTE — Progress Notes (Addendum)
Psych consult placed for patient who was sent to the hospital for hypoglycemia and fluid rehydration. She previously had [facility] Haldol Dec last dose received 08/24. See [facility] provider note from 8/25.  SHe is to continue her current medications once she is stable to transfer back to [facility]. Psych team has been working closely to resolve her paranoia and psychosis. She has been limiting her intake of oral foods and drinks (due to her psychosis).  -Her current medicine regimen is as follows Haldol 10mg  po TID, Cogentin 0.5mg  po BID prn, Hydroxyzine 25mg  po TID prn, Paliperidone 6mg  po qhs, Trazadone 50mg  po qhs. Once she is stable the plan is to transfer her back to [facility].  -Psych consult not warranted, meds have been re-ordered. May administer her Haldol PO or IM.  -VSS are stable at this time, FBG has been above 70 since 536am. Will continue to monitor.

## 2020-03-14 NOTE — Progress Notes (Signed)
Per Selena Batten , [facility] [phone]) patient can return to [facility] once she is medically cleared.

## 2020-03-14 NOTE — Progress Notes (Addendum)
Patients IV infiltrated. Patient is refusing IV access and IV fluids. Attending Glenard Haring. Per MD, "Sugars look good, encourage PO intake."

## 2020-03-14 NOTE — ED Provider Notes (Signed)
Care assumed from Dr. Effie Shy, patient with schizophrenia and accepted at [facility], but noted to be hypoglycemic.  She has not been cooperating to eating.  Glucose would come up with parenteral dextrose, but then come back down.  She was placed on a continuous dextrose infusion.  After several hours, glucose was elevated to 244.  Dextrose infusion was discontinued and glucose as dropped down to 70.  She is given dextrose intravenously, started back on dextrose infusion.  She will need to be admitted until glucose has stabilized.  Case is discussed with Dr. Toniann Fail of [facility], who agrees to admit the patient.   Dione Booze, MD 03/14/20 [phone]

## 2020-03-14 NOTE — H&P (Signed)
History and Physical    [name] JEH:[id number] DOB: [birth year] DOA: 03/14/2020  PCP: [facility]  Patient coming from: [facility]  Chief Complaint: Weakness  HPI: [name] is a 59 y.o. female with medical history significant of paranoid schizophrenia presents from [facility] with increased weakness, found to be hypoglycemic after reportedly not eating well for well over 1 week.  Patient reportedly claims to only tolerate certain food items specifically only items purchased from the grocery store.  Denies abdominal pain or nausea and vomiting  ED Course: In the emergency department, patient was noted to be hypoglycemic with glucose noted to be  61.  Patient was later agreeable to oral nutrition, however later, patient again noted to be hypoglycemic and required dextrose containing IV fluids  Review of Systems:  Review of Systems  Constitutional: Negative for chills, fever and weight loss.  HENT: Negative for congestion, ear pain, nosebleeds and tinnitus.   Eyes: Negative for double vision, photophobia and discharge.  Respiratory: Negative for hemoptysis and shortness of breath.   Cardiovascular: Negative for chest pain, palpitations and claudication.  Gastrointestinal: Negative for abdominal pain, nausea and vomiting.  Musculoskeletal: Negative for back pain, joint pain and neck pain.  Neurological: Negative for speech change, focal weakness, seizures and loss of consciousness.  Psychiatric/Behavioral: Negative for memory loss. The patient is not nervous/anxious.     Past Medical History:  Diagnosis Date  . Non compliance w medication regimen   . Schizophrenia (HCC)     No past surgical history on file.   reports that she has been smoking cigarettes. She has never used smokeless tobacco. She reports current alcohol use. She reports that she does not use drugs.  Allergies  Allergen Reactions  . Aspirin Other (See Comments)    Possibly caused hives per sister  . Geodon  [Ziprasidone Hcl] Other (See Comments)    Caused twitching    No family history on file.  When asked in presence of sitter, patient verbalized to me no known family medical issues  Prior to Admission medications   Not on File    Physical Exam: Vitals:   03/14/20 0703  BP: 128/75  Pulse: 85  Resp: 15  Temp: (!) 97.5 F (36.4 C)  TempSrc: Oral  SpO2: 100%    Constitutional: NAD, calm, comfortable Vitals:   03/14/20 0703  BP: 128/75  Pulse: 85  Resp: 15  Temp: (!) 97.5 F (36.4 C)  TempSrc: Oral  SpO2: 100%   Eyes: PERRL, lids and conjunctivae normal ENMT: Mucous membranes are moist. Posterior pharynx clear of any exudate or lesions.Normal dentition.  Neck: normal, supple, no masses, no thyromegaly Respiratory: clear to auscultation bilaterally, normal resp effort Cardiovascular: Regular rate and rhythm, s1, s2 Abdomen: no tenderness, no masses palpated. No hepatosplenomegaly. Bowel sounds positive.  Musculoskeletal: no clubbing / cyanosis. No joint deformity upper and lower extremities. Good ROM, no contractures. Normal muscle tone.  Skin: no rashes, lesions Neurologic: CN 2-12 grossly intact. Sensation intact. Strength 5/5 in all 4.  Psychiatric: Normal judgment and insight. Alert and oriented x 3. Normal mood.    Labs on Admission: I have personally reviewed following labs and imaging studies  CBC: Recent Labs  Lab 03/13/20 1136  WBC 4.9  HGB 11.2*  HCT 33.9*  MCV 87.1  PLT 137*   Basic Metabolic Panel: Recent Labs  Lab 03/13/20 1235  NA 134*  K 3.7  CL 104  CO2 15*  GLUCOSE 61*  BUN 10  CREATININE  0.71  CALCIUM 7.4*   GFR: CrCl cannot be calculated (Unknown ideal weight.). Liver Function Tests: Recent Labs  Lab 03/13/20 1235  AST 22  ALT 12  ALKPHOS 44  BILITOT 1.2  PROT 6.1*  ALBUMIN 3.2*   No results for input(s): LIPASE, AMYLASE in the last 168 hours. No results for input(s): AMMONIA in the last 168 hours. Coagulation  Profile: No results for input(s): INR, PROTIME in the last 168 hours. Cardiac Enzymes: Recent Labs  Lab 03/13/20 2215  CKTOTAL 201   BNP (last 3 results) No results for input(s): PROBNP in the last 8760 hours. HbA1C: No results for input(s): HGBA1C in the last 72 hours. CBG: Recent Labs  Lab 03/14/20 0122 03/14/20 0222 03/14/20 0312 03/14/20 0345 03/14/20 0535  GLUCAP 146* 101* 78 70 163*   Lipid Profile: No results for input(s): CHOL, HDL, LDLCALC, TRIG, CHOLHDL, LDLDIRECT in the last 72 hours. Thyroid Function Tests: No results for input(s): TSH, T4TOTAL, FREET4, T3FREE, THYROIDAB in the last 72 hours. Anemia Panel: No results for input(s): VITAMINB12, FOLATE, FERRITIN, TIBC, IRON, RETICCTPCT in the last 72 hours. Urine analysis:    Component Value Date/Time   COLORURINE YELLOW 03/06/2020 1815   APPEARANCEUR CLEAR 03/06/2020 1815   LABSPEC 1.024 03/06/2020 1815   PHURINE 5.0 03/06/2020 1815   GLUCOSEU NEGATIVE 03/06/2020 1815   HGBUR MODERATE (A) 03/06/2020 1815   BILIRUBINUR NEGATIVE 03/06/2020 1815   KETONESUR 80 (A) 03/06/2020 1815   PROTEINUR 30 (A) 03/06/2020 1815   UROBILINOGEN 0.2 04/10/2015 1111   NITRITE NEGATIVE 03/06/2020 1815   LEUKOCYTESUR NEGATIVE 03/06/2020 1815   Sepsis Labs: !!!!!!!!!!!!!!!!!!!!!!!!!!!!!!!!!!!!!!!!!!!! @LABRCNTIP (procalcitonin:4,lacticidven:4) ) Recent Results (from the past 240 hour(s))  SARS Coronavirus 2 by RT PCR (hospital order, performed in [facility] hospital lab) Nasopharyngeal Nasopharyngeal Swab     Status: None   Collection Time: 03/14/20  3:18 AM   Specimen: Nasopharyngeal Swab  Result Value Ref Range Status   SARS Coronavirus 2 NEGATIVE NEGATIVE Final    Comment: (NOTE) SARS-CoV-2 target nucleic acids are NOT DETECTED.  The SARS-CoV-2 RNA is generally detectable in upper and lower respiratory specimens during the acute phase of infection. The lowest concentration of SARS-CoV-2 viral copies this assay can  detect is 250 copies / mL. A negative result does not preclude SARS-CoV-2 infection and should not be used as the sole basis for treatment or other patient management decisions.  A negative result may occur with improper specimen collection / handling, submission of specimen other than nasopharyngeal swab, presence of viral mutation(s) within the areas targeted by this assay, and inadequate number of viral copies (<250 copies / mL). A negative result must be combined with clinical observations, patient history, and epidemiological information.  Fact Sheet for Patients:   03/16/20  Fact Sheet for Healthcare Providers: BoilerBrush.com.cy  This test is not yet approved or  cleared by the https://pope.com/ FDA and has been authorized for detection and/or diagnosis of SARS-CoV-2 by FDA under an Emergency Use Authorization (EUA).  This EUA will remain in effect (meaning this test can be used) for the duration of the COVID-19 declaration under Section 564(b)(1) of the Act, 21 U.S.C. section 360bbb-3(b)(1), unless the authorization is terminated or revoked sooner.  Performed at [facility], [address] [place], [place]      Radiological Exams on Admission: No results found.  EKG: Independently reviewed.  Most recent EKG from 3/17/202, sinus with QTC of 485  Assessment/Plan Principal Problem:   Hypoglycemia Active  Problems:   Schizophrenia, paranoid (HCC)   Noncompliance with diet and medication regimen   Paranoid schizophrenia (HCC)  1. Hypoglycemia 1. Likely secondary to poor p.o. intake.  Per history, patient has not been eating for well over a week per below 2. Continue dextrose fluids, wean as tolerated 3. Encourage diet as tolerated 2. Schizophrenia, paranoid 1. Patient presented from [facility] for above hypoglycemia 2. We will consult psychiatry 3. Noncompliance with diet 1. Encourage diet  as tolerated. 2. We will consult nutrition  DVT prophylaxis: Lovenox subq  Code Status: Full Family Communication:   Disposition Plan: [facility]  Consults called: Psychiatry Admission status: Observation as will likely require less than 2 midnight stay to stabilize blood glucose  Rickey Barbara MD [facility] Pager On Amion  If 7PM-7AM, please contact night-coverage  03/14/2020, 7:49 AM

## 2020-03-15 ENCOUNTER — Other Ambulatory Visit: Payer: Self-pay

## 2020-03-15 ENCOUNTER — Inpatient Hospital Stay (HOSPITAL_COMMUNITY)
Admission: AD | Admit: 2020-03-15 | Discharge: 2020-03-25 | Disposition: A | Discharge status: Still In Hospital | Payer: Medicare Other | Attending: Psychiatry | Admitting: Psychiatry

## 2020-03-15 ENCOUNTER — Encounter (HOSPITAL_COMMUNITY): Payer: Self-pay | Admitting: Psychiatry

## 2020-03-15 DIAGNOSIS — Z8659 Personal history of other mental and behavioral disorders: Secondary | ICD-10-CM

## 2020-03-15 DIAGNOSIS — N179 Acute kidney failure, unspecified: Secondary | ICD-10-CM | POA: Diagnosis not present

## 2020-03-15 DIAGNOSIS — E860 Dehydration: Secondary | ICD-10-CM | POA: Diagnosis present

## 2020-03-15 DIAGNOSIS — I9589 Other hypotension: Secondary | ICD-10-CM

## 2020-03-15 DIAGNOSIS — F209 Schizophrenia, unspecified: Secondary | ICD-10-CM | POA: Diagnosis present

## 2020-03-15 DIAGNOSIS — R630 Anorexia: Secondary | ICD-10-CM | POA: Diagnosis present

## 2020-03-15 DIAGNOSIS — F17210 Nicotine dependence, cigarettes, uncomplicated: Secondary | ICD-10-CM | POA: Diagnosis present

## 2020-03-15 DIAGNOSIS — W19XXXA Unspecified fall, initial encounter: Secondary | ICD-10-CM | POA: Diagnosis not present

## 2020-03-15 DIAGNOSIS — E162 Hypoglycemia, unspecified: Secondary | ICD-10-CM

## 2020-03-15 DIAGNOSIS — Z79899 Other long term (current) drug therapy: Secondary | ICD-10-CM

## 2020-03-15 DIAGNOSIS — F200 Paranoid schizophrenia: Secondary | ICD-10-CM | POA: Diagnosis present

## 2020-03-15 DIAGNOSIS — D649 Anemia, unspecified: Secondary | ICD-10-CM | POA: Diagnosis not present

## 2020-03-15 DIAGNOSIS — E876 Hypokalemia: Secondary | ICD-10-CM

## 2020-03-15 DIAGNOSIS — G4700 Insomnia, unspecified: Secondary | ICD-10-CM | POA: Diagnosis present

## 2020-03-15 DIAGNOSIS — Z9119 Patient's noncompliance with other medical treatment and regimen: Secondary | ICD-10-CM | POA: Diagnosis not present

## 2020-03-15 DIAGNOSIS — E440 Moderate protein-calorie malnutrition: Secondary | ICD-10-CM | POA: Diagnosis not present

## 2020-03-15 DIAGNOSIS — I959 Hypotension, unspecified: Secondary | ICD-10-CM | POA: Diagnosis not present

## 2020-03-15 DIAGNOSIS — Y92009 Unspecified place in unspecified non-institutional (private) residence as the place of occurrence of the external cause: Secondary | ICD-10-CM | POA: Diagnosis not present

## 2020-03-15 MED ORDER — TRAZODONE HCL 50 MG PO TABS: 50.0000 mg | ORAL_TABLET | Freq: Every day | ORAL | Status: DC

## 2020-03-15 MED ORDER — BENZTROPINE MESYLATE 1 MG PO TABS
1.0000 mg | ORAL_TABLET | Freq: Two times a day (BID) | ORAL | Status: DC
  Filled 2020-03-15 (×8): qty 1

## 2020-03-15 MED ORDER — PALIPERIDONE ER 6 MG PO TB24: 6.0000 mg | ORAL_TABLET | Freq: Every day | ORAL | Status: DC

## 2020-03-15 MED ORDER — MEGESTROL ACETATE 40 MG PO TABS
40.0000 mg | ORAL_TABLET | Freq: Every day | ORAL | Status: DC
  Filled 2020-03-15 (×5): qty 1

## 2020-03-15 MED ORDER — BENZTROPINE MESYLATE 0.5 MG PO TABS
0.5000 mg | ORAL_TABLET | Freq: Two times a day (BID) | ORAL | Status: DC | PRN
Start: 2020-03-15 — End: 2020-10-02

## 2020-03-15 MED ORDER — ALUM & MAG HYDROXIDE-SIMETH 200-200-20 MG/5ML PO SUSP: 30.0000 mL | ORAL | Status: DC | PRN

## 2020-03-15 MED ORDER — MEGESTROL ACETATE 40 MG PO TABS
40.0000 mg | ORAL_TABLET | Freq: Every day | ORAL | Status: DC
  Filled 2020-03-15: qty 1

## 2020-03-15 MED ORDER — HALOPERIDOL 5 MG PO TABS
10.0000 mg | ORAL_TABLET | Freq: Three times a day (TID) | ORAL | Status: DC
  Filled 2020-03-15 (×11): qty 2

## 2020-03-15 MED ORDER — HALOPERIDOL DECANOATE 100 MG/ML IM SOLN
100.0000 mg | Freq: Once | INTRAMUSCULAR | Status: AC
  Administered 2020-03-15: 100 mg via INTRAMUSCULAR
  Filled 2020-03-15: qty 1

## 2020-03-15 MED ORDER — MAGNESIUM HYDROXIDE 400 MG/5ML PO SUSP: 30.0000 mL | Freq: Every day | ORAL | Status: DC | PRN

## 2020-03-15 MED ORDER — MEGESTROL ACETATE 40 MG PO TABS: 40.0000 mg | ORAL_TABLET | Freq: Every day | ORAL | Status: DC

## 2020-03-15 MED ORDER — BENZTROPINE MESYLATE 1 MG PO TABS: 1.0000 mg | ORAL_TABLET | Freq: Four times a day (QID) | ORAL | Status: DC | PRN

## 2020-03-15 MED ORDER — HALOPERIDOL LACTATE 5 MG/ML IJ SOLN
10.0000 mg | Freq: Three times a day (TID) | INTRAMUSCULAR | Status: DC
  Administered 2020-03-15 – 2020-03-17 (×5): 10 mg via INTRAMUSCULAR
  Filled 2020-03-15 (×11): qty 2

## 2020-03-15 MED ORDER — MIRTAZAPINE 15 MG PO TABS
15.0000 mg | ORAL_TABLET | Freq: Every day | ORAL | Status: DC
  Filled 2020-03-15 (×4): qty 1

## 2020-03-15 MED ORDER — HYDROXYZINE HCL 25 MG PO TABS: 25.0000 mg | ORAL_TABLET | Freq: Three times a day (TID) | ORAL | 0 refills | Status: DC | PRN

## 2020-03-15 MED ORDER — HALOPERIDOL 5 MG PO TABS: 5.0000 mg | ORAL_TABLET | Freq: Four times a day (QID) | ORAL | Status: DC | PRN

## 2020-03-15 MED ORDER — TRAZODONE HCL 100 MG PO TABS: 50.0000 mg | ORAL_TABLET | Freq: Every evening | ORAL | Status: DC | PRN

## 2020-03-15 MED ORDER — HALOPERIDOL 10 MG PO TABS: 10.0000 mg | ORAL_TABLET | Freq: Three times a day (TID) | ORAL | Status: DC

## 2020-03-15 MED ORDER — ACETAMINOPHEN 325 MG PO TABS: 650.0000 mg | ORAL_TABLET | Freq: Four times a day (QID) | ORAL | Status: DC | PRN

## 2020-03-15 MED ORDER — HYDROXYZINE HCL 25 MG PO TABS: 25.0000 mg | ORAL_TABLET | Freq: Three times a day (TID) | ORAL | Status: DC | PRN

## 2020-03-15 NOTE — Tx Team (Signed)
Initial Treatment Plan 03/15/2020 6:09 PM [name] DTH:[id number]    PATIENT STRESSORS: Health problems Medication change or noncompliance   PATIENT STRENGTHS: Supportive family/friends   PATIENT IDENTIFIED PROBLEMS: Auditory hallucinations  anxiety  Medication noncompliance                 DISCHARGE CRITERIA:  Ability to meet basic life and health needs Improved stabilization in mood, thinking, and/or behavior Medical problems require only outpatient monitoring Motivation to continue treatment in a less acute level of care  PRELIMINARY DISCHARGE PLAN: Attend aftercare/continuing care group Participate in family therapy Return to previous living arrangement  PATIENT/FAMILY INVOLVEMENT: This treatment plan has been presented to and reviewed with the patient, [name].  The patient and family have been given the opportunity to ask questions and make suggestions.  Raylene Miyamoto, RN 03/15/2020, 6:09 PM

## 2020-03-15 NOTE — Discharge Summary (Signed)
Physician Discharge Summary  [name] DDU:[id number] DOB: 07/27/1960 DOA: 03/14/2020  PCP: [facility]  Admit date: 03/14/2020 Discharge date: 03/15/2020  Admitted From: behavioral heath Disposition:  Behavioral health  Discharge Condition:Stable CODE STATUS:FULL Diet recommendation:  Regular  Brief/Interim Summary:  HPI: [name] is a 59 y.o. female with medical history significant of paranoid schizophrenia presents from [facility] with increased weakness, found to be hypoglycemic after reportedly not eating well for well over 1 week.  Patient reportedly claims to only tolerate certain food items specifically only items purchased from the grocery store.  Denies abdominal pain or nausea and vomiting  ED Course: In the emergency department, patient was noted to be hypoglycemic with glucose noted to be  61.  Patient was later agreeable to oral nutrition, however later, patient again noted to be hypoglycemic and required dextrose containing IV fluids  Hospital course:  Hospital course remained stable.  Her blood sugars are well controlled this morning.  IV fluids discontinued.  She has been encouraged to take oral diet.  Started on Megace for lack of appetite.  She does not have any difficulty with swallowing.  Her decreased oral intake is most likely psychogenic.  she has remained hemodynamically stable.  She is medically stable for discharge back to behavioral health whenever bed is available.  Following problems were addressed during her hospitalization:  1. Hypoglycemia 1. Likely secondary to poor p.o. intake.  Per history, patient has not been eating for well over a week per below 2. Given dextrose fluids,now stopped 3. Encourage diet as tolerated  2. Schizophrenia, paranoid 1. Patient presented from [facility] for above hypoglycemia 2. Continue current medications  3. Noncompliance with diet 1. Encourage diet as tolerated.  Started on Megace 2. Nutrition consulted 3.  Etiology  is most likely psychogenic  Discharge Diagnoses:  Principal Problem:   Hypoglycemia Active Problems:   Schizophrenia, paranoid (HCC)   Noncompliance with diet and medication regimen   Paranoid schizophrenia [facility])    Discharge Instructions  Discharge Instructions    Diet general   Complete by: As directed    Discharge instructions   Complete by: As directed    Please take your medications as instructed   Increase activity slowly   Complete by: As directed      Allergies as of 03/15/2020      Reactions   Aspirin Other (See Comments)   Possibly caused hives per sister   Geodon [ziprasidone Hcl] Other (See Comments)   Caused twitching      Medication List    TAKE these medications   benztropine 0.5 MG tablet Commonly known as: COGENTIN Take 1 tablet (0.5 mg total) by mouth 2 (two) times daily as needed for tremors.   haloperidol 10 MG tablet Commonly known as: HALDOL Take 1 tablet (10 mg total) by mouth 3 (three) times daily.   hydrOXYzine 25 MG tablet Commonly known as: ATARAX/VISTARIL Take 1 tablet (25 mg total) by mouth 3 (three) times daily as needed for anxiety.   megestrol 40 MG tablet Commonly known as: MEGACE Take 1 tablet (40 mg total) by mouth daily.   paliperidone 6 MG 24 hr tablet Commonly known as: INVEGA Take 1 tablet (6 mg total) by mouth at bedtime.   traZODone 50 MG tablet Commonly known as: DESYREL Take 1 tablet (50 mg total) by mouth at bedtime.       Allergies  Allergen Reactions  . Aspirin Other (See Comments)    Possibly caused hives per  sister  . Geodon [Ziprasidone Hcl] Other (See Comments)    Caused twitching    Consultations:  none   Procedures/Studies:  No results found.    Subjective: Patient seen and examined the bedside this morning.  Medically stable for discharge to inpatient psych as soon as bed is available.  Discharge Exam: Vitals:   03/14/20 1248 03/14/20 1328  BP: 114/75 111/70  Pulse: 82 75   Resp: 14 20  Temp:  98.9 F (37.2 C)  SpO2: 100% 100%   Vitals:   03/14/20 0703 03/14/20 1248 03/14/20 1328  BP: 128/75 114/75 111/70  Pulse: 85 82 75  Resp: 15 14 20   Temp: (!) 97.5 F (36.4 C)  98.9 F (37.2 C)  TempSrc: Oral  Oral  SpO2: 100% 100% 100%    General: Pt is alert, awake, not in acute distress Cardiovascular: RRR, S1/S2 +, no rubs, no gallops Respiratory: CTA bilaterally, no wheezing, no rhonchi Abdominal: Soft, NT, ND, bowel sounds + Extremities: no edema, no cyanosis    The results of significant diagnostics from this hospitalization (including imaging, microbiology, ancillary and laboratory) are listed below for reference.     Microbiology: Recent Results (from the past 240 hour(s))  SARS Coronavirus 2 by RT PCR (hospital order, performed in [facility] hospital lab) Nasopharyngeal Nasopharyngeal Swab     Status: None   Collection Time: 03/14/20  3:18 AM   Specimen: Nasopharyngeal Swab  Result Value Ref Range Status   SARS Coronavirus 2 NEGATIVE NEGATIVE Final    Comment: (NOTE) SARS-CoV-2 target nucleic acids are NOT DETECTED.  The SARS-CoV-2 RNA is generally detectable in upper and lower respiratory specimens during the acute phase of infection. The lowest concentration of SARS-CoV-2 viral copies this assay can detect is 250 copies / mL. A negative result does not preclude SARS-CoV-2 infection and should not be used as the sole basis for treatment or other patient management decisions.  A negative result may occur with improper specimen collection / handling, submission of specimen other than nasopharyngeal swab, presence of viral mutation(s) within the areas targeted by this assay, and inadequate number of viral copies (<250 copies / mL). A negative result must be combined with clinical observations, patient history, and epidemiological information.  Fact Sheet for Patients:   03/16/20  Fact Sheet for  Healthcare Providers: BoilerBrush.com.cy  This test is not yet approved or  cleared by the https://pope.com/ FDA and has been authorized for detection and/or diagnosis of SARS-CoV-2 by FDA under an Emergency Use Authorization (EUA).  This EUA will remain in effect (meaning this test can be used) for the duration of the COVID-19 declaration under Section 564(b)(1) of the Act, 21 U.S.C. section 360bbb-3(b)(1), unless the authorization is terminated or revoked sooner.  Performed at [facility], [address] [place], [place]      Labs: BNP (last 3 results) No results for input(s): BNP in the last 8760 hours. Basic Metabolic Panel: Recent Labs  Lab 03/13/20 1235 03/14/20 0931  NA 134*  --   K 3.7  --   CL 104  --   CO2 15*  --   GLUCOSE 61*  --   BUN 10  --   CREATININE 0.71 0.62  CALCIUM 7.4*  --    Liver Function Tests: Recent Labs  Lab 03/13/20 1235  AST 22  ALT 12  ALKPHOS 44  BILITOT 1.2  PROT 6.1*  ALBUMIN 3.2*   No results for input(s): LIPASE, AMYLASE in  the last 168 hours. No results for input(s): AMMONIA in the last 168 hours. CBC: Recent Labs  Lab 03/13/20 1136 03/14/20 0931  WBC 4.9 5.8  HGB 11.2* 11.7*  HCT 33.9* 34.1*  MCV 87.1 83.6  PLT 137* 164   Cardiac Enzymes: Recent Labs  Lab 03/13/20 2215  CKTOTAL 201   BNP: Invalid input(s): POCBNP CBG: Recent Labs  Lab 03/14/20 0345 03/14/20 0535 03/14/20 0931 03/14/20 1250 03/14/20 1711  GLUCAP 70 163* 114* 111* 115*   D-Dimer No results for input(s): DDIMER in the last 72 hours. Hgb A1c Recent Labs    03/14/20 0931  HGBA1C 5.7*   Lipid Profile No results for input(s): CHOL, HDL, LDLCALC, TRIG, CHOLHDL, LDLDIRECT in the last 72 hours. Thyroid function studies No results for input(s): TSH, T4TOTAL, T3FREE, THYROIDAB in the last 72 hours.  Invalid input(s): FREET3 Anemia work up No results for input(s): VITAMINB12, FOLATE,  FERRITIN, TIBC, IRON, RETICCTPCT in the last 72 hours. Urinalysis    Component Value Date/Time   COLORURINE YELLOW 03/06/2020 1815   APPEARANCEUR CLEAR 03/06/2020 1815   LABSPEC 1.024 03/06/2020 1815   PHURINE 5.0 03/06/2020 1815   GLUCOSEU NEGATIVE 03/06/2020 1815   HGBUR MODERATE (A) 03/06/2020 1815   BILIRUBINUR NEGATIVE 03/06/2020 1815   KETONESUR 80 (A) 03/06/2020 1815   PROTEINUR 30 (A) 03/06/2020 1815   UROBILINOGEN 0.2 04/10/2015 1111   NITRITE NEGATIVE 03/06/2020 1815   LEUKOCYTESUR NEGATIVE 03/06/2020 1815   Sepsis Labs Invalid input(s): PROCALCITONIN,  WBC,  LACTICIDVEN Microbiology Recent Results (from the past 240 hour(s))  SARS Coronavirus 2 by RT PCR (hospital order, performed in [facility] hospital lab) Nasopharyngeal Nasopharyngeal Swab     Status: None   Collection Time: 03/14/20  3:18 AM   Specimen: Nasopharyngeal Swab  Result Value Ref Range Status   SARS Coronavirus 2 NEGATIVE NEGATIVE Final    Comment: (NOTE) SARS-CoV-2 target nucleic acids are NOT DETECTED.  The SARS-CoV-2 RNA is generally detectable in upper and lower respiratory specimens during the acute phase of infection. The lowest concentration of SARS-CoV-2 viral copies this assay can detect is 250 copies / mL. A negative result does not preclude SARS-CoV-2 infection and should not be used as the sole basis for treatment or other patient management decisions.  A negative result may occur with improper specimen collection / handling, submission of specimen other than nasopharyngeal swab, presence of viral mutation(s) within the areas targeted by this assay, and inadequate number of viral copies (<250 copies / mL). A negative result must be combined with clinical observations, patient history, and epidemiological information.  Fact Sheet for Patients:   BoilerBrush.com.cyhttps://www.fda.gov/media/136312/download  Fact Sheet for Healthcare Providers: https://pope.com/https://www.fda.gov/media/136313/download  This test is not  yet approved or  cleared by the [place] FDA and has been authorized for detection and/or diagnosis of SARS-CoV-2 by FDA under an Emergency Use Authorization (EUA).  This EUA will remain in effect (meaning this test can be used) for the duration of the COVID-19 declaration under Section 564(b)(1) of the Act, 21 U.S.C. section 360bbb-3(b)(1), unless the authorization is terminated or revoked sooner.  Performed at [facility], [address] [place], [place] [address]     Please note: You were cared for by a hospitalist during your hospital stay. Once you are discharged, your primary care physician will handle any further medical issues. Please note that NO REFILLS for any discharge medications will be authorized once you are discharged, as it is imperative that you return to  your primary care physician (or establish a relationship with a primary care physician if you do not have one) for your post hospital discharge needs so that they can reassess your need for medications and monitor your lab values.    Time coordinating discharge: 40 minutes  SIGNED:   Burnadette Pop, MD  [facility] 03/15/2020, 10:56 AM Pager [phone]  If 7PM-7AM, please contact night-coverage [email or web address] Password [email or web address]

## 2020-03-15 NOTE — Progress Notes (Signed)
Patient isolative to self and rrom. No interaction with peers. Minimal interaction with staff. Denies SI, HI, AVH. Refused night medications.  Encouragement and support provided. Safety checks maintained. Medications given as prescribed. Pt receptive and remains safe on unit with q 15 min checks/
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
## 2020-03-15 NOTE — Plan of Care (Signed)
°  Problem: Coping: Goal: Ability to identify and develop effective coping behavior will improve Outcome: Not Progressing   Problem: Self-Concept: Goal: Level of anxiety will decrease Outcome: Progressing   Problem: Education: Goal: Knowledge of the prescribed therapeutic regimen will improve Outcome: Not Progressing   Problem: Activity: Goal: Interest or engagement in leisure activities will improve Outcome: Not Progressing   Problem: Coping: Goal: Will verbalize feelings Outcome: Not Progressing   Problem: Health Behavior/Discharge Planning: Goal: Compliance with therapeutic regimen will improve Outcome: Not Progressing
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
## 2020-03-15 NOTE — Progress Notes (Signed)
RN and sitter have tried to encourage PO intake but pt has declined other than a bit of water. Mick Sell RN

## 2020-03-15 NOTE — Progress Notes (Signed)
Report was called to Moshe Salisbury at [facility].

## 2020-03-15 NOTE — Care Management Obs Status (Signed)
MEDICARE OBSERVATION STATUS NOTIFICATION   Patient Details  Name: [name] MRN: [id number] Date of Birth: [birth year]   Medicare Observation Status Notification Given:  No (Unable to reach patient's legal guardian, Austin Pongratz, at the number listed for her [phone]). HIPPA compliant voicemail message left and no return call received.)    Elliot Gault, LCSW 03/15/2020, 12:40 PM

## 2020-03-16 DIAGNOSIS — F200 Paranoid schizophrenia: Principal | ICD-10-CM

## 2020-03-16 LAB — GLUCOSE, CAPILLARY: Glucose-Capillary: 106 mg/dL — ABNORMAL HIGH (ref 70–99)

## 2020-03-16 NOTE — Progress Notes (Signed)
Patient had low b/p. Encouraged patient to drink gatorade. Pt drank 1 cup of gatorade.

## 2020-03-16 NOTE — [facility] Group Notes (Signed)
  [facility] LCSW Group Therapy Note  Date/Time:  03/16/2020 1:15PM-2:00PM  Type of Therapy and Topic:  Group Therapy:  Feelings About Hospitalization  Participation Level:  Did Not Attend   Description of Group This process group involved patients discussing their feelings related to being hospitalized, as well as the benefits they see to being in the hospital.  These feelings and benefits were itemized.  The group then brainstormed specific ways in which they could seek those same benefits when they discharge and return home.  Therapeutic Goals Patient will identify and describe positive and negative feelings related to hospitalization Patient will verbalize benefits of hospitalization to themselves personally Patients will brainstorm together ways they can obtain similar benefits in the outpatient setting, identify barriers to wellness and possible solutions  Summary of Patient Progress:  The patient did not attend.  Therapeutic Modalities Cognitive Behavioral Therapy Motivational Interviewing    Annette Bertelson Grossman-Orr, LCSW 03/16/2020, 3:04 PM    

## 2020-03-16 NOTE — Progress Notes (Signed)
Pt remains guarded and isolative in room without behavior outburst. Continue to refuse all medications PO but is compliant with Haldol IM. Minimal but cooperative on interactions. Pt drank 240 ml cup of ice water and 240 ml cup of gatorade this shift. Verbally engaged with staff eye contact is avertive. Emotional support and encouragement offered as needed throughout this shift. Medications offered as ordered. Safety checks effective without self harm gestures or outburst to note thus far.

## 2020-03-16 NOTE — Progress Notes (Signed)
Adult Psychoeducational Group Note  Date:  03/16/2020 Time:  9:36 PM  Group Topic/Focus:  Wrap-Up Group:   The focus of this group is to help patients review their daily goal of treatment and discuss progress on daily workbooks.  Participation Level:  Did Not Attend  Participation Quality:  Did Not Attend  Affect:  Did Not Attend  Cognitive:  Did Not Attend  Insight: None  Engagement in Group:  Did Not Attend  Modes of Intervention:  Did Not Attend  Additional Comments:  Pt did not attend evening wrap up group tonight.  Felipa Furnace 03/16/2020, 9:36 PM

## 2020-03-16 NOTE — Progress Notes (Signed)
[facility] MD Progress Note  03/16/2020 1:50 PM [name]  MRN:  [id number] Subjective:  Patient is a 59 year old female with a past psychiatric history significant for schizophrenia who was admitted on 03/02/2020 secondary to worsening psychotic symptoms and noncompliance with her schizophrenic medications.  Objective: Patient is seen and examined.  Patient is a 59 year old female with the above-stated past psychiatric history who is seen in follow-up.  She was returned from [facility] medical observation unit.  She was sent there secondary to poor p.o. intake and dehydration.  She remained there for over 24 hours secondary to hypoglycemia.  She was returned yesterday afternoon.  She is essentially unchanged from a psychiatric perspective.  She is still paranoid and refusing to eat.  She received her second Haldol Decanoate injection 100 mg IM yesterday.  She continues on oral or IM Haldol.  The medications seem to be taking some effect and that she is lowering her guard somewhat, and talked about how she felt as though her family had been substituted, and that they were not there true family members.  This was after I discussed the potential of having her family bring in food that she did not believe was poisoned.  Otherwise she remains paranoid and unchanged.  Her blood pressure is stable this morning.  It is 93/65.  Pulse was 72.  She is afebrile.  Staff was able to get a cup of Gatorade in her last night.  She slept 6.5 hours last night.  Laboratories from 8/25 when she was sent to the medical unit revealed a mildly low sodium at 134, creatinine was at 0.62.  Liver function enzymes were normal.  CBC was normal.  Her CK was mildly elevated to 201.  Platelets were 164,000.  Hemoglobin A1c was 5.7.  TSH from 8/12 was 1.908.  HIV was negative.  Principal Problem: <principal problem not specified> Diagnosis: Active Problems:   Schizophrenia (HCC)  Total Time spent with patient: 20 minutes  Past Psychiatric  History: See admission H&P  Past Medical History:  Past Medical History:  Diagnosis Date  . Non compliance w medication regimen   . Schizophrenia (HCC)    History reviewed. No pertinent surgical history. Family History: History reviewed. No pertinent family history. Family Psychiatric  History: See admission H&P Social History:  Social History   Substance and Sexual Activity  Alcohol Use Yes   Comment: Refuses to disclose how much     Social History   Substance and Sexual Activity  Drug Use No   Comment: Refuses to answer    Social History   Socioeconomic History  . Marital status: Single    Spouse name: Not on file  . Number of children: Not on file  . Years of education: Not on file  . Highest education level: Not on file  Occupational History  . Not on file  Tobacco Use  . Smoking status: Current Some Day Smoker    Types: Cigarettes  . Smokeless tobacco: Never Used  Vaping Use  . Vaping Use: Every day  Substance and Sexual Activity  . Alcohol use: Yes    Comment: Refuses to disclose how much  . Drug use: No    Comment: Refuses to answer  . Sexual activity: Not on file    Comment: refused to answer  Other Topics Concern  . Not on file  Social History Narrative  . Not on file   Social Determinants of Health   Financial Resource Strain:   . Difficulty  of Paying Living Expenses: Not on file  Food Insecurity:   . Worried About Programme researcher, broadcasting/film/video in the Last Year: Not on file  . Ran Out of Food in the Last Year: Not on file  Transportation Needs:   . Lack of Transportation (Medical): Not on file  . Lack of Transportation (Non-Medical): Not on file  Physical Activity:   . Days of Exercise per Week: Not on file  . Minutes of Exercise per Session: Not on file  Stress:   . Feeling of Stress : Not on file  Social Connections:   . Frequency of Communication with Friends and Family: Not on file  . Frequency of Social Gatherings with Friends and Family: Not  on file  . Attends Religious Services: Not on file  . Active Member of Clubs or Organizations: Not on file  . Attends Banker Meetings: Not on file  . Marital Status: Not on file   Additional Social History:                         Sleep: Good  Appetite:  Poor  Current Medications: Current Facility-Administered Medications  Medication Dose Route Frequency Provider Last Rate Last Admin  . acetaminophen (TYLENOL) tablet 650 mg  650 mg Oral Q6H PRN Antonieta Pert, MD      . alum & mag hydroxide-simeth (MAALOX/MYLANTA) 200-200-20 MG/5ML suspension 30 mL  30 mL Oral Q4H PRN Antonieta Pert, MD      . haloperidol (HALDOL) tablet 5 mg  5 mg Oral Q6H PRN Antonieta Pert, MD       And  . benztropine (COGENTIN) tablet 1 mg  1 mg Oral Q6H PRN Antonieta Pert, MD      . benztropine (COGENTIN) tablet 1 mg  1 mg Oral BID Antonieta Pert, MD      . haloperidol (HALDOL) tablet 10 mg  10 mg Oral TID Antonieta Pert, MD       Or  . haloperidol lactate (HALDOL) injection 10 mg  10 mg Intramuscular TID Antonieta Pert, MD   10 mg at 03/16/20 1300  . hydrOXYzine (ATARAX/VISTARIL) tablet 25 mg  25 mg Oral TID PRN Antonieta Pert, MD      . magnesium hydroxide (MILK OF MAGNESIA) suspension 30 mL  30 mL Oral Daily PRN Antonieta Pert, MD      . megestrol (MEGACE) tablet 40 mg  40 mg Oral Daily Antonieta Pert, MD      . mirtazapine (REMERON) tablet 15 mg  15 mg Oral QHS Antonieta Pert, MD      . traZODone (DESYREL) tablet 50 mg  50 mg Oral QHS PRN Antonieta Pert, MD        Lab Results:  Results for orders placed or performed during the hospital encounter of 03/15/20 (from the past 48 hour(s))  Glucose, capillary     Status: Abnormal   Collection Time: 03/16/20  6:21 AM  Result Value Ref Range   Glucose-Capillary 106 (H) 70 - 99 mg/dL    Comment: Glucose reference range applies only to samples taken after fasting for at least 8 hours.     Blood Alcohol level:  Lab Results  Component Value Date   [facility] <10 02/29/2020   ETH <10 10/04/2019    Metabolic Disorder Labs: Lab Results  Component Value Date   HGBA1C 5.7 (H) 03/14/2020   MPG 116.89 03/14/2020  MPG 128 04/16/2015   Lab Results  Component Value Date   PROLACTIN 13.6 04/16/2015   Lab Results  Component Value Date   CHOL 186 04/16/2015   TRIG 64 04/16/2015   HDL 55 04/16/2015   CHOLHDL 3.4 04/16/2015   VLDL 13 04/16/2015   LDLCALC 118 (H) 04/16/2015    Physical Findings: AIMS: Facial and Oral Movements Muscles of Facial Expression: None, normal Lips and Perioral Area: None, normal Jaw: None, normal Tongue: None, normal,Extremity Movements Upper (arms, wrists, hands, fingers): None, normal Lower (legs, knees, ankles, toes): None, normal, Trunk Movements Neck, shoulders, hips: None, normal, Overall Severity Severity of abnormal movements (highest score from questions above): None, normal Incapacitation due to abnormal movements: None, normal Patient's awareness of abnormal movements (rate only patient's report): No Awareness, Dental Status Current problems with teeth and/or dentures?: No Does patient usually wear dentures?: No  CIWA:    COWS:     Musculoskeletal: Strength & Muscle Tone: decreased Gait & Station: normal Patient leans: [name]  Psychiatric Specialty Exam: Physical Exam Vitals and nursing note reviewed.  HENT:     Head: Normocephalic and atraumatic.  Pulmonary:     Effort: Pulmonary effort is normal.  Neurological:     General: No focal deficit present.     Mental Status: She is alert.     Review of Systems  Blood pressure 93/65, pulse 72, resp. rate 16, SpO2 97 %.There is no height or weight on file to calculate BMI.  General Appearance: Disheveled  Eye Contact:  Minimal  Speech:  Normal Rate  Volume:  Normal  Mood:  Anxious, Dysphoric and Irritable  Affect:  Labile  Thought Process:  Goal Directed and Descriptions  of Associations: Loose  Orientation:  Negative  Thought Content:  Delusions and Paranoid Ideation  Suicidal Thoughts:  No  Homicidal Thoughts:  No  Memory:  Immediate;   Poor Recent;   Poor Remote;   Poor  Judgement:  Impaired  Insight:  Lacking  Psychomotor Activity:  Normal  Concentration:  Concentration: Fair and Attention Span: Fair  Recall:  [employer] of Knowledge:  Fair  Language:  Good  Akathisia:  Negative  Handed:  Right  AIMS (if indicated):     Assets:  Desire for Improvement Resilience  ADL's:  Impaired  Cognition:  WNL  Sleep:  Number of Hours: 6.25     Treatment Plan Summary: Daily contact with patient to assess and evaluate symptoms and progress in treatment, Medication management and Plan : Patient is seen and examined.  Patient is a 59 year old female with the above-stated past psychiatric history is seen in follow-up.  Diagnosis: 1.  Schizophrenia 2.  Anorexia secondary to paranoia from schizophrenia  Pertinent findings on examination today: 1.  Continues with paranoia, fear of eating food because of poisoning.  She also endorsed symptoms today that her family members had been substituted and that the people she lives with her not really her family. 2.  Blood pressure is stable. 3.  Poor p.o. intake continues. 4.  Patient was given her second Haldol Decanoate 100 mg injection yesterday.  Plan: 1.  Continue Cogentin 1 mg p.o. twice daily for side effects of medication. 2.  Continue haloperidol 10 mg p.o. or IM 3 times daily for psychosis. 3.  Patient has received a total of Haldol decanoate 200 mg IM over the last 4 days.  This is for psychosis. 4.  Continue hydroxyzine 25 mg p.o. 3 times daily as needed anxiety. 5.  Start Megace 40 mg p.o. daily for appetite stimulation. 6.  Start mirtazapine 15 mg p.o. nightly for appetite stimulation as well as sleep and decrease anxiety and agitation. 7.  Continue daily Accu-Cheks for glucose levels. 8.  [facility]  referral has been made. 9.  Disposition planning-in progress.  Antonieta PertGreg Lawson Ashni Lonzo, MD 03/16/2020, 1:50 PM

## 2020-03-17 LAB — GLUCOSE, CAPILLARY: Glucose-Capillary: 94 mg/dL (ref 70–99)

## 2020-03-17 MED ORDER — HALOPERIDOL 5 MG PO TABS
10.0000 mg | ORAL_TABLET | Freq: Two times a day (BID) | ORAL | Status: DC
  Filled 2020-03-17 (×5): qty 2

## 2020-03-17 MED ORDER — MEGESTROL ACETATE 40 MG PO TABS
160.0000 mg | ORAL_TABLET | Freq: Every day | ORAL | Status: DC
  Filled 2020-03-17 (×2): qty 4

## 2020-03-17 MED ORDER — HALOPERIDOL LACTATE 5 MG/ML IJ SOLN
10.0000 mg | Freq: Two times a day (BID) | INTRAMUSCULAR | Status: DC
  Administered 2020-03-18 – 2020-03-19 (×3): 10 mg via INTRAMUSCULAR
  Filled 2020-03-17 (×5): qty 2

## 2020-03-17 NOTE — Progress Notes (Signed)
Nurse and [facility] staff have continually encouraged patient to drink and eat throughout the day.

## 2020-03-17 NOTE — [facility] Group Notes (Signed)
[facility] LCSW Group Therapy Note  Date/Time:  03/17/2020  11:00AM-12:00PM  Type of Therapy and Topic:  Group Therapy:  Music and Mood  Participation Level:  Active   Description of Group: In this process group, members listened to a variety of genres of music and identified that different types of music evoke different responses.  Patients were encouraged to identify music that was soothing for them and music that was energizing for them.  Patients discussed how this knowledge can help with wellness and recovery in various ways including managing depression and anxiety as well as encouraging healthy sleep habits.    Therapeutic Goals: Patients will explore the impact of different varieties of music on mood Patients will verbalize the thoughts they have when listening to different types of music Patients will identify music that is soothing to them as well as music that is energizing to them Patients will discuss how to use this knowledge to assist in maintaining wellness and recovery Patients will explore the use of music as a coping skill  Summary of Patient Progress:  At the beginning of group, patient expressed that she felt "just here" with no particular feelings at all. She talked a little in group when called on directly, and said one of the songs in particular (classical) was calming for her.  At the end of group, patient expressed that she still felt like she was "the same, just here."    Therapeutic Modalities: Solution Focused Brief Therapy Activity   Ambrose Mantle, LCSW

## 2020-03-17 NOTE — Progress Notes (Signed)
Patient drank bottled water ounces 16.9 and gatorade 11.6 ounces.  Patient refused ensure at this time.  Patient continues to sit on side of bed and look out her window.  Respirations even and unlabored.  No signs/symptoms of pain/distress noted on patient's face/body movements.  Safety maintained with 15 minute checks.

## 2020-03-17 NOTE — Progress Notes (Signed)
Patient in bed asleep with eyes closed and respirations even and unlabored, no distress noted.

## 2020-03-17 NOTE — Progress Notes (Signed)
   03/17/20 2100  COVID-19 Daily Checkoff  Have you had a fever (temp > 37.80C/100F)  in the past 24 hours?  No  If you have had runny nose, nasal congestion, sneezing in the past 24 hours, has it worsened? No  COVID-19 EXPOSURE  Have you traveled outside the state in the past 14 days? No  Have you been in contact with someone with a confirmed diagnosis of COVID-19 or PUI in the past 14 days without wearing appropriate PPE? No  Have you been living in the same home as a person with confirmed diagnosis of COVID-19 or a PUI (household contact)? No  Have you been diagnosed with COVID-19? No

## 2020-03-17 NOTE — Progress Notes (Signed)
[facility] MD Progress Note  03/17/2020 12:19 PM [name]  MRN:  [id number] Subjective:  Patient is a 59 year old female with a past psychiatric history significant for schizophrenia who was admitted on 03/02/2020 secondary to worsening psychotic symptoms and noncompliance with her schizophrenic medications.  Objective: Seen and examined.  Patient is a 59 year old female with the above-stated past psychiatric history seen in follow-up.  She is essentially unchanged from yesterday.  She did take fluids yesterday.  Nursing staff has been able to continue to get fluids in her, but she continues to refuse any solid food.  She also has refused any Ensure or supplemental products.  As stated previously when we discussed family bringing something from home to eat she believes that her family has been substituted with other people.  We added Megace and Remeron yesterday in an attempt to stimulate her appetite.  She is less irritable, but I am unsure whether her irritability decreases because of weakness from having not eaten in basically 3 weeks or more, or because the haloperidol is finally kicking in.  Her blood pressure continues to be low at 73/51.  She is mildly tachycardic at a rate of 105.  Her weight on 8/29 is 51.256 kg.  No other weights are recorded from at least 02/29/2020 in the [facility] emergency department.  Principal Problem: <principal problem not specified> Diagnosis: Active Problems:   Schizophrenia (HCC)  Total Time spent with patient: 20 minutes  Past Psychiatric History: See admission H&P  Past Medical History:  Past Medical History:  Diagnosis Date  . Non compliance w medication regimen   . Schizophrenia (HCC)    History reviewed. No pertinent surgical history. Family History: History reviewed. No pertinent family history. Family Psychiatric  History: See admission H&P Social History:  Social History   Substance and Sexual Activity  Alcohol Use Yes    Comment: Refuses to disclose how much     Social History   Substance and Sexual Activity  Drug Use No   Comment: Refuses to answer    Social History   Socioeconomic History  . Marital status: Single    Spouse name: Not on file  . Number of children: Not on file  . Years of education: Not on file  . Highest education level: Not on file  Occupational History  . Not on file  Tobacco Use  . Smoking status: Current Some Day Smoker    Types: Cigarettes  . Smokeless tobacco: Never Used  Vaping Use  . Vaping Use: Every day  Substance and Sexual Activity  . Alcohol use: Yes    Comment: Refuses to disclose how much  . Drug use: No    Comment: Refuses to answer  . Sexual activity: Not on file    Comment: refused to answer  Other Topics Concern  . Not on file  Social History Narrative  . Not on file   Social Determinants of Health   Financial Resource Strain:   . Difficulty of Paying Living Expenses: Not on file  Food Insecurity:   . Worried About Programme researcher, broadcasting/film/video in the Last Year: Not on file  . Ran Out of Food in the Last Year: Not on file  Transportation Needs:   . Lack of Transportation (Medical): Not on file  . Lack of Transportation (Non-Medical): Not on file  Physical Activity:   . Days of Exercise per Week: Not on file  . Minutes of Exercise per Session: Not on file  Stress:   .  Feeling of Stress : Not on file  Social Connections:   . Frequency of Communication with Friends and Family: Not on file  . Frequency of Social Gatherings with Friends and Family: Not on file  . Attends Religious Services: Not on file  . Active Member of Clubs or Organizations: Not on file  . Attends Banker Meetings: Not on file  . Marital Status: Not on file   Additional Social History:                         Sleep: Good  Appetite:  Poor  Current Medications: Current Facility-Administered Medications  Medication Dose Route Frequency Provider Last  Rate Last Admin  . acetaminophen (TYLENOL) tablet 650 mg  650 mg Oral Q6H PRN Antonieta Pert, MD      . alum & mag hydroxide-simeth (MAALOX/MYLANTA) 200-200-20 MG/5ML suspension 30 mL  30 mL Oral Q4H PRN Antonieta Pert, MD      . haloperidol (HALDOL) tablet 5 mg  5 mg Oral Q6H PRN Antonieta Pert, MD       And  . benztropine (COGENTIN) tablet 1 mg  1 mg Oral Q6H PRN Antonieta Pert, MD      . benztropine (COGENTIN) tablet 1 mg  1 mg Oral BID Antonieta Pert, MD      . haloperidol (HALDOL) tablet 10 mg  10 mg Oral TID Antonieta Pert, MD       Or  . haloperidol lactate (HALDOL) injection 10 mg  10 mg Intramuscular TID Antonieta Pert, MD   10 mg at 03/17/20 5176  . hydrOXYzine (ATARAX/VISTARIL) tablet 25 mg  25 mg Oral TID PRN Antonieta Pert, MD      . magnesium hydroxide (MILK OF MAGNESIA) suspension 30 mL  30 mL Oral Daily PRN Antonieta Pert, MD      . megestrol (MEGACE) tablet 40 mg  40 mg Oral Daily Antonieta Pert, MD      . mirtazapine (REMERON) tablet 15 mg  15 mg Oral QHS Antonieta Pert, MD      . traZODone (DESYREL) tablet 50 mg  50 mg Oral QHS PRN Antonieta Pert, MD        Lab Results:  Results for orders placed or performed during the hospital encounter of 03/15/20 (from the past 48 hour(s))  Glucose, capillary     Status: Abnormal   Collection Time: 03/16/20  6:21 AM  Result Value Ref Range   Glucose-Capillary 106 (H) 70 - 99 mg/dL    Comment: Glucose reference range applies only to samples taken after fasting for at least 8 hours.  Glucose, capillary     Status: None   Collection Time: 03/17/20  6:14 AM  Result Value Ref Range   Glucose-Capillary 94 70 - 99 mg/dL    Comment: Glucose reference range applies only to samples taken after fasting for at least 8 hours.    Blood Alcohol level:  Lab Results  Component Value Date   [facility] <10 02/29/2020   ETH <10 10/04/2019    Metabolic Disorder Labs: Lab Results  Component Value Date    HGBA1C 5.7 (H) 03/14/2020   MPG 116.89 03/14/2020   MPG 128 04/16/2015   Lab Results  Component Value Date   PROLACTIN 13.6 04/16/2015   Lab Results  Component Value Date   CHOL 186 04/16/2015   TRIG 64 04/16/2015   HDL 55 04/16/2015  CHOLHDL 3.4 04/16/2015   VLDL 13 04/16/2015   LDLCALC 118 (H) 04/16/2015    Physical Findings: AIMS: Facial and Oral Movements Muscles of Facial Expression: None, normal Lips and Perioral Area: None, normal Jaw: None, normal Tongue: None, normal,Extremity Movements Upper (arms, wrists, hands, fingers): None, normal Lower (legs, knees, ankles, toes): None, normal, Trunk Movements Neck, shoulders, hips: None, normal, Overall Severity Severity of abnormal movements (highest score from questions above): None, normal Incapacitation due to abnormal movements: None, normal Patient's awareness of abnormal movements (rate only patient's report): No Awareness, Dental Status Current problems with teeth and/or dentures?: No Does patient usually wear dentures?: No  CIWA:    COWS:     Musculoskeletal: Strength & Muscle Tone: decreased Gait & Station: normal Patient leans: [name]  Psychiatric Specialty Exam: Physical Exam Vitals and nursing note reviewed.  HENT:     Head: Normocephalic and atraumatic.  Pulmonary:     Effort: Pulmonary effort is normal.  Neurological:     General: No focal deficit present.     Mental Status: She is alert.     Review of Systems  Blood pressure 93/65, pulse 72, resp. rate 16, weight 51.3 kg, SpO2 97 %.Body mass index is 19.4 kg/m.  General Appearance: Disheveled  Eye Contact:  Minimal  Speech:  Normal Rate  Volume:  Decreased  Mood:  Dysphoric  Affect:  Flat  Thought Process:  Goal Directed and Descriptions of Associations: Loose  Orientation:  Negative  Thought Content:  Delusions  Suicidal Thoughts:  No  Homicidal Thoughts:  No  Memory:  Immediate;   Poor Recent;   Poor Remote;   Poor  Judgement:   Impaired  Insight:  Lacking  Psychomotor Activity:  Decreased  Concentration:  Concentration: Fair and Attention Span: Fair  Recall:  [employer] of Knowledge:  Poor  Language:  Fair  Akathisia:  Negative  Handed:  Right  AIMS (if indicated):     Assets:  Desire for Improvement Resilience  ADL's:  Impaired  Cognition:  WNL  Sleep:  Number of Hours: 7.25     Treatment Plan Summary: Daily contact with patient to assess and evaluate symptoms and progress in treatment, Medication management and Plan : Patient is seen and examined.  Patient is a 59 year old female with the above-stated past psychiatric history who is seen in follow-up.   Diagnosis: 1.  Schizophrenia 2.  Anorexia secondary to paranoia from schizophrenia  Pertinent findings on examination today: 1.  Continues with paranoia, fear of eating food secondary to poisoning. 2.  Blood pressures once again beginning to drop. 3.  Poor p.o. intake continues. 4.  Force fluids is at least getting some fluid in her.  Plan: 1.  We will stop Cogentin for possible contribution to low blood pressure. 2.  Decrease haloperidol to 10 mg p.o. or IM twice daily for psychosis. 3.  Patient received a total of Haldol Decanoate 200 mg IM over the last several days.  This is for psychosis. 4.  Continue hydroxyzine 25 mg p.o. 3 times daily as needed anxiety. 5.  Increase Megace to 80 mg p.o. daily for appetite stimulation. 6.  Continue mirtazapine 15 mg p.o. nightly for sleep and appetite stimulation. 7.  Continue daily Accu-Cheks for glucose levels. 8.  [facility] referral has been made. 9.  Disposition planning-in progress. Antonieta Pert, MD 03/17/2020, 12:19 PM

## 2020-03-17 NOTE — Progress Notes (Signed)
   03/16/20 2050  COVID-19 Daily Checkoff  Have you had a fever (temp > 37.80C/100F)  in the past 24 hours?  No  If you have had runny nose, nasal congestion, sneezing in the past 24 hours, has it worsened? No  COVID-19 EXPOSURE  Have you traveled outside the state in the past 14 days? No  Have you been in contact with someone with a confirmed diagnosis of COVID-19 or PUI in the past 14 days without wearing appropriate PPE? No  Have you been living in the same home as a person with confirmed diagnosis of COVID-19 or a PUI (household contact)? No  Have you been diagnosed with COVID-19? No   

## 2020-03-18 LAB — GLUCOSE, CAPILLARY: Glucose-Capillary: 91 mg/dL (ref 70–99)

## 2020-03-18 MED ORDER — MEGESTROL ACETATE 40 MG PO TABS
320.0000 mg | ORAL_TABLET | Freq: Every day | ORAL | Status: DC
  Filled 2020-03-18 (×2): qty 8

## 2020-03-18 MED ORDER — MIRTAZAPINE 7.5 MG PO TABS
7.5000 mg | ORAL_TABLET | Freq: Every day | ORAL | Status: DC
  Filled 2020-03-18 (×4): qty 1

## 2020-03-18 NOTE — Progress Notes (Signed)
Recreation Therapy Notes  Date: 8.30.21 Time: 1000 Location: 500 Hall Dayroom  Group Topic: Coping Skills  Goal Area(s) Addresses:  Patient will identify positive coping strategies. Patient will identify benefit of using positive coping strategies.  Behavioral Response: Minimal  Intervention: Worksheet, Pencil  Activity: Mind Map.  LRT and patients filled out the first 8 boxes (anger, grief/guilt, sorrow, laziness, loneliness, depression, anxiety and pain).  Patients were then given the time to come up with at three coping skills for each situation.  LRT would then write the coping skills on the board with the corresponding situation.  Education: Pharmacologist, Building control surveyor.   Education Outcome: Acknowledges understanding/In group clarification offered/Needs additional education.   Clinical Observations/Feedback: Pt was quiet but would engage when prompted.  Pt filled out her sheet but didn't share any coping skills during processing.    Caroll Rancher, LRT/CTRS         Caroll Rancher A 03/18/2020 11:56 AM

## 2020-03-18 NOTE — [facility] Counselor (Signed)
CSW faxed updated records to [facility]. This patient remains on the [facility] waitlist.    Ruthann Cancer MSW, LCSW Clincal Social Worker  [facility]

## 2020-03-18 NOTE — Progress Notes (Signed)
°   03/18/20 2100  Psych Admission Type (Psych Patients Only)  Admission Status Involuntary  Psychosocial Assessment  Patient Complaints Apathy;Irritability  Eye Contact Avertive;Brief  Facial Expression Angry;Anxious;Pensive;Worried  Affect Angry;Anxious;Apathetic;Irritable;Preoccupied  Speech Logical/coherent;Soft  Interaction Avoidant;Cautious;Forwards little;Guarded;Minimal  Motor Activity Slow  Appearance/Hygiene Poor hygiene  Behavior Characteristics Unwilling to participate  Mood Anxious;Labile;Sad;Suspicious  Thought Administrator, sports thinking  Content Blaming others  Delusions Paranoid  Perception WDL  Hallucination None reported or observed  Judgment Poor  Confusion Moderate  Danger to Self  Current suicidal ideation? Denies  Danger to Others  Danger to Others None reported or observed

## 2020-03-18 NOTE — Progress Notes (Signed)
Psychoeducational Group Note  Date:  03/18/2020 Time:  2040  Group Topic/Focus:  Wrap-Up Group:   The focus of this group is to help patients review their daily goal of treatment and discuss progress on daily workbooks.  Participation Level: Did Not Attend  Participation Quality:  Not Applicable  Affect:  Not Applicable  Cognitive:  Not Applicable  Insight:  Not Applicable  Engagement in Group: Not Applicable  Additional Comments:  The patient did not attend group this evening.   Indy Kuck S 03/18/2020, 8:40 PM

## 2020-03-18 NOTE — Plan of Care (Signed)
Progress note  D: pt found in their room; noncompliant with oral medications. Pt was given IM medications willingly. Pt continues to refuse nutrition and fluid. Pt provided encouragement and education. Pt was seen in group, but didn't seem to be participating. Pt is still focused on discharge and reclusive other than group. Pt is pleasant. Pt denies si/hi/ah/vh and verbally agrees to approach staff if these become apparent or before harming themself/others while at [facility].  A: Pt provided support and encouragement. Pt given medication per protocol and standing orders. Q32m safety checks implemented and continued.  R: Pt safe on the unit. Will continue to monitor.  Pt progressing in the following metrics  Problem: Education: Goal: Ability to state activities that reduce stress will improve Outcome: Progressing   Problem: Self-Concept: Goal: Ability to identify factors that promote anxiety will improve Outcome: Progressing   Problem: Education: Goal: Utilization of techniques to improve thought processes will improve Outcome: Progressing   Problem: Role Relationship: Goal: Will demonstrate positive changes in social behaviors and relationships Outcome: Progressing

## 2020-03-18 NOTE — Progress Notes (Signed)
Adult Psychoeducational Group Note  Date:  03/18/2020 Time:  12:46 AM  Group Topic/Focus:  Wrap-Up Group:   The focus of this group is to help patients review their daily goal of treatment and discuss progress on daily workbooks.  Participation Level:  Did Not Attend  Participation Quality:  Did Not Attend  Affect:  Did Not Attend  Cognitive:  Did Not Attend  Insight: None  Engagement in Group:  Did Not Attend  Modes of Intervention:  Did Not Attend  Additional Comments:  Pt did not attend evening wrap up group tonight.  Felipa Furnace 03/18/2020, 12:46 AM

## 2020-03-18 NOTE — Progress Notes (Signed)
[facility] MD Progress Note  03/18/2020 11:26 AM [name]  MRN:  [id number] Subjective:  Patient is a 59 year old female with a past psychiatric history significant for schizophrenia who was admitted on 03/02/2020 secondary to worsening psychotic symptoms and noncompliance with her schizophrenic medications.  Objective: Patient is seen and examined.  Patient is a 59 year old female with the above-stated past psychiatric history who is seen in follow-up.  She is essentially unchanged from yesterday.  Her blood pressure improved a little bit later during the day, but this morning once again is rather low.  I have discussed with the patient and attempted to negotiate anything that she would eat.  She stated the only thing she will eat is something she has cooked.  She still believes that her family members have been substituted.  She will not take anything here including supplements that are sealed because she is paranoid of those.  I told her that we would end up being forced to send her back to [facility] for IV fluids, and most probably attempted to be transferred to [facility] where I believe she would have to have tube feeds to improve her nutritional status.  She again stated that she would not eat anything here.  Her blood pressure this morning was 80/57, repeat was 74/58.  Heart rate was 78 initially, repeat was 95.  She remains afebrile.  She only slept 4.5 hours last night.  Her last recorded weight from 8/29 was 51.256 kg.  She remains paranoid and delusional.  No new laboratories.  Principal Problem: <principal problem not specified> Diagnosis: Active Problems:   Schizophrenia (HCC)  Total Time spent with patient: 20 minutes  Past Psychiatric History: See admission H&P  Past Medical History:  Past Medical History:  Diagnosis Date  . Non compliance w medication regimen   . Schizophrenia (HCC)    History reviewed. No pertinent surgical history. Family History: History reviewed.  No pertinent family history. Family Psychiatric  History: See admission H&P Social History:  Social History   Substance and Sexual Activity  Alcohol Use Yes   Comment: Refuses to disclose how much     Social History   Substance and Sexual Activity  Drug Use No   Comment: Refuses to answer    Social History   Socioeconomic History  . Marital status: Single    Spouse name: Not on file  . Number of children: Not on file  . Years of education: Not on file  . Highest education level: Not on file  Occupational History  . Not on file  Tobacco Use  . Smoking status: Current Some Day Smoker    Types: Cigarettes  . Smokeless tobacco: Never Used  Vaping Use  . Vaping Use: Every day  Substance and Sexual Activity  . Alcohol use: Yes    Comment: Refuses to disclose how much  . Drug use: No    Comment: Refuses to answer  . Sexual activity: Not on file    Comment: refused to answer  Other Topics Concern  . Not on file  Social History Narrative  . Not on file   Social Determinants of Health   Financial Resource Strain:   . Difficulty of Paying Living Expenses: Not on file  Food Insecurity:   . Worried About Programme researcher, broadcasting/film/video in the Last Year: Not on file  . Ran Out of Food in the Last Year: Not on file  Transportation Needs:   . Lack of Transportation (Medical): Not  on file  . Lack of Transportation (Non-Medical): Not on file  Physical Activity:   . Days of Exercise per Week: Not on file  . Minutes of Exercise per Session: Not on file  Stress:   . Feeling of Stress : Not on file  Social Connections:   . Frequency of Communication with Friends and Family: Not on file  . Frequency of Social Gatherings with Friends and Family: Not on file  . Attends Religious Services: Not on file  . Active Member of Clubs or Organizations: Not on file  . Attends BankerClub or Organization Meetings: Not on file  . Marital Status: Not on file   Additional Social History:                          Sleep: Fair  Appetite:  Poor  Current Medications: Current Facility-Administered Medications  Medication Dose Route Frequency Provider Last Rate Last Admin  . acetaminophen (TYLENOL) tablet 650 mg  650 mg Oral Q6H PRN Antonieta Pertlary, Isadore Bokhari Lawson, MD      . alum & mag hydroxide-simeth (MAALOX/MYLANTA) 200-200-20 MG/5ML suspension 30 mL  30 mL Oral Q4H PRN Antonieta Pertlary, Stryker Veasey Lawson, MD      . haloperidol (HALDOL) tablet 5 mg  5 mg Oral Q6H PRN Antonieta Pertlary, Riker Collier Lawson, MD       And  . benztropine (COGENTIN) tablet 1 mg  1 mg Oral Q6H PRN Antonieta Pertlary, Mayetta Castleman Lawson, MD      . haloperidol (HALDOL) tablet 10 mg  10 mg Oral BID Antonieta Pertlary, Jaclyn Andy Lawson, MD       Or  . haloperidol lactate (HALDOL) injection 10 mg  10 mg Intramuscular BID Antonieta Pertlary, Traeson Dusza Lawson, MD   10 mg at 03/18/20 [id number]  . hydrOXYzine (ATARAX/VISTARIL) tablet 25 mg  25 mg Oral TID PRN Antonieta Pertlary, Prachi Oftedahl Lawson, MD      . magnesium hydroxide (MILK OF MAGNESIA) suspension 30 mL  30 mL Oral Daily PRN Antonieta Pertlary, Zenab Gronewold Lawson, MD      . megestrol (MEGACE) tablet 160 mg  160 mg Oral Daily Antonieta Pertlary, Conan Mcmanaway Lawson, MD      . mirtazapine (REMERON) tablet 15 mg  15 mg Oral QHS Antonieta Pertlary, Numan Zylstra Lawson, MD      . traZODone (DESYREL) tablet 50 mg  50 mg Oral QHS PRN Antonieta Pertlary, Maryori Weide Lawson, MD        Lab Results:  Results for orders placed or performed during the hospital encounter of 03/15/20 (from the past 48 hour(s))  Glucose, capillary     Status: None   Collection Time: 03/17/20  6:14 AM  Result Value Ref Range   Glucose-Capillary 94 70 - 99 mg/dL    Comment: Glucose reference range applies only to samples taken after fasting for at least 8 hours.  Glucose, capillary     Status: None   Collection Time: 03/18/20  6:27 AM  Result Value Ref Range   Glucose-Capillary 91 70 - 99 mg/dL    Comment: Glucose reference range applies only to samples taken after fasting for at least 8 hours.    Blood Alcohol level:  Lab Results  Component [facility]   [facility]   ETH <10  10/04/2019    Metabolic Disorder Labs: Lab Results  Component Value Date   HGBA1C 5.7 (H) 03/14/2020   MPG 116.89 03/14/2020   MPG 128 04/16/2015   Lab Results  Component Value Date   PROLACTIN 13.6 04/16/2015   Lab Results  Component Value Date   CHOL 186 04/16/2015   TRIG 64 04/16/2015   HDL 55 04/16/2015   CHOLHDL 3.4 04/16/2015   VLDL 13 04/16/2015   LDLCALC 118 (H) 04/16/2015    Physical Findings: AIMS: Facial and Oral Movements Muscles of Facial Expression: None, normal Lips and Perioral Area: None, normal Jaw: None, normal Tongue: None, normal,Extremity Movements Upper (arms, wrists, hands, fingers): None, normal Lower (legs, knees, ankles, toes): None, normal, Trunk Movements Neck, shoulders, hips: None, normal, Overall Severity Severity of abnormal movements (highest score from questions above): None, normal Incapacitation due to abnormal movements: None, normal Patient's awareness of abnormal movements (rate only patient's report): No Awareness, Dental Status Current problems with teeth and/or dentures?: No Does patient usually wear dentures?: No  CIWA:    COWS:     Musculoskeletal: Strength & Muscle Tone: decreased Gait & Station: normal Patient leans: [name]  Psychiatric Specialty Exam: Physical Exam Vitals and nursing note reviewed.  HENT:     Head: Normocephalic and atraumatic.  Pulmonary:     Effort: Pulmonary effort is normal.  Neurological:     General: No focal deficit present.     Mental Status: She is alert and oriented to person, place, and time.     Review of Systems  Blood pressure 104/70, pulse 79, resp. rate 16, weight 51.3 kg, SpO2 99 %.Body mass index is 19.4 kg/m.  General Appearance: Disheveled  Eye Contact:  Fair  Speech:  Normal Rate  Volume:  Decreased  Mood:  Dysphoric  Affect:  Flat  Thought Process:  Goal Directed and Descriptions of Associations: Loose  Orientation:  Negative  Thought Content:  Delusions and  Paranoid Ideation  Suicidal Thoughts:  No  Homicidal Thoughts:  No  Memory:  Immediate;   Fair Recent;   Fair Remote;   Fair  Judgement:  Impaired  Insight:  Lacking  Psychomotor Activity:  Decreased  Concentration:  Concentration: Fair and Attention Span: Fair  Recall:  Poor  Fund of Knowledge:  Poor  Language:  Fair  Akathisia:  Negative  Handed:  Right  AIMS (if indicated):     Assets:  Desire for Improvement Resilience  ADL's:  Impaired  Cognition:  WNL  Sleep:  Number of Hours: 4.5     Treatment Plan Summary: Daily contact with patient to assess and evaluate symptoms and progress in treatment, Medication management and Plan : Patient is seen and examined.  Patient is a 59 year old female with the above-stated past psychiatric history who is seen in follow-up.  Diagnosis: 1. Schizophrenia 2. Anorexia secondary to paranoia from schizophrenia  Pertinent findings on examination today: 1.  Paranoia continues with fear of eating food secondary to poisoning. 2.  She is at least taking some fluids in. 3.  I have discussed with her the fact that I feel as though she may end up being sent back to the medical hospital for IV fluids, or perhaps tube feeds if necessary.  I have basically offered her any type of p.o. intake with protein that she would be willing to take.  She is still unwilling to take any food that she does not cook herself.  Plan: 1.  We will contact her family to see if she does cook anything or eat anything prior to any idea of discharge.  If they can confirm that she eats at home it may be the better part of valor to discharge her there so she can get some p.o. nutrition. 2.  We will  [facility].  The patient may need to be in their medical unit and have some form of force-feeding present. 3.  We may need to transfer the patient back to [facility] for continued IV supplementation.  Her electrolytes have been normal so far, but  she may be getting to the point that she requires some type of hyperalimentation. 4.  Patient has received a total of Haldol Decanoate 200 mg IM over the last several days.  This is for psychosis. 5.  Continue hydroxyzine 25 mg p.o. 3 times daily as needed anxiety. 6..  Increase Megace to 320 mg p.o. daily for appetite stimulation. 7.  Decrease mirtazapine to7.5 mg p.o. nightly for sleep and appetite stimulation. 8.  Continue daily Accu-Cheks for glucose levels.  Blood sugar this morning was 91. 9.  Contact [facility] for possible transfer to their medical unit as well as psychiatric reasons for continued care for schizophrenia as well as a medical unit to assist with forced nutritional augmentation. 10.  Disposition planning-in progress. Antonieta Pert, MD 03/18/2020, 11:26 AM

## 2020-03-19 LAB — GLUCOSE, CAPILLARY: Glucose-Capillary: 81 mg/dL (ref 70–99)

## 2020-03-19 MED ORDER — MEGESTROL ACETATE 40 MG PO TABS
160.0000 mg | ORAL_TABLET | Freq: Two times a day (BID) | ORAL | Status: DC
  Administered 2020-03-20: 160 mg via ORAL
  Filled 2020-03-19 (×6): qty 4

## 2020-03-19 MED ORDER — ENSURE ENLIVE PO LIQD
237.0000 mL | Freq: Two times a day (BID) | ORAL | Status: DC
  Filled 2020-03-19 (×3): qty 237

## 2020-03-19 MED ORDER — ADULT MULTIVITAMIN W/MINERALS CH
1.0000 | ORAL_TABLET | Freq: Every day | ORAL | Status: DC
  Administered 2020-03-21 – 2020-03-25 (×4): 1 via ORAL
  Filled 2020-03-19 (×11): qty 1

## 2020-03-19 MED ORDER — BOOST / RESOURCE BREEZE PO LIQD CUSTOM
1.0000 | Freq: Two times a day (BID) | ORAL | Status: DC
  Administered 2020-03-19 – 2020-03-25 (×5): 1 via ORAL
  Filled 2020-03-19 (×19): qty 1

## 2020-03-19 MED ORDER — HALOPERIDOL 5 MG PO TABS
15.0000 mg | ORAL_TABLET | Freq: Every day | ORAL | Status: DC
  Filled 2020-03-19 (×2): qty 3

## 2020-03-19 MED ORDER — HALOPERIDOL LACTATE 5 MG/ML IJ SOLN
15.0000 mg | Freq: Every day | INTRAMUSCULAR | Status: DC
  Filled 2020-03-19 (×2): qty 3

## 2020-03-19 NOTE — [facility] Counselor (Signed)
CSW invited this patient to attend court hearing for continuation of IVC, however this patient declined.    CSW attended court hearing for this patient. Per judge this patients IVC has been continued until Tuesday, 04/02/2020.   Ruthann Cancer MSW, LCSW Clincal Social Worker  [facility]

## 2020-03-19 NOTE — Progress Notes (Signed)
NUTRITION ASSESSMENT RD working remotely.  Pt identified as at risk on the Malnutrition Screen Tool  INTERVENTION: - will order Boost Breeze BID, each supplement provides 250 kcal and 9 grams of protein. - will order Ensure Enlive BID, each supplement provides 350 kcal and 20 grams of protein. - will order 1 tablet multivitamin with minerals/day.   NUTRITION DIAGNOSIS: Unintentional weight loss related to sub-optimal intake as evidenced by pt report.   Goal: Pt to meet >/= 90% of their estimated nutrition needs.  Monitor:  PO intake  Assessment:  Patient admitted with schizophrenia and paranoia. Notes indicate she was non-compliant with schizophrenia medications PTA.   Consult received for patient who will drink water but not accept other items due to paranoia and believing that she is being poisoned through foods and drinks.   Other patients who have presented with this behavior have been accepting of pre-packaged items that they open on their own rather than food served on a meal tray or received unpackaged from the cafeteria.   We do not have any protein water or protein powder available at [facility], but will order oral nutrition supplements as outlined above.   Weight today is 110 lb and weight on 03/17/20 was 113 lb. Prior to this, the most recently documented weight in the chart was on 11/17/17 when she weighed 150 lb.    59 y.o. female  Height: Ht Readings from Last 1 Encounters:  11/17/17 5\' 4"  (1.626 m)    Weight: Wt Readings from Last 1 Encounters:  03/19/20 49.9 kg    Weight Hx: Wt Readings from Last 10 Encounters:  03/19/20 49.9 kg  11/17/17 68 kg  04/12/15 64 kg  11/06/11 58.1 kg    BMI:  Body mass index is 18.88 kg/m. Pt meets criteria for normal weight/borderline underweight based on current BMI.  Estimated Nutritional Needs: Kcal: 25-30 kcal/kg Protein: > 1 gram protein/kg Fluid: 1 ml/kcal  Diet Order:  Diet Order            Diet regular Room  service appropriate? Yes; Fluid consistency: Thin  Diet effective now                Pt is also offered choice of unit snacks mid-morning and mid-afternoon.  Pt is eating as desired.   Lab results and medications reviewed.       11/08/11, MS, RD, LDN, CNSC Inpatient Clinical Dietitian RD pager # available in [facility]  After hours/weekend pager # available in [facility]

## 2020-03-19 NOTE — [facility] Counselor (Signed)
CSW faxed referral for ECT services to [facility] and left a voicemail to follow up on referral.   Rawlins Stuard MSW, LCSW Clincal Social Worker  [facility]  

## 2020-03-19 NOTE — Progress Notes (Signed)
[facility] MD Progress Note  03/19/2020 11:01 AM Brent Noto  MRN:  [id number] Subjective:  Patient is a 59 year old female with a past psychiatric history significant for schizophrenia who was admitted on 03/02/2020 secondary to worsening psychotic symptoms and noncompliance with her schizophrenic medications.  Objective: Patient is seen and examined.  Patient is a 59 year old female with the above-stated past psychiatric history is seen in follow-up.  She is essentially unchanged.  We attempted to find some water product that had protein in it, but that apparently is not available at our hospital.  Nutrition has made some recommendations.  Nursing notes reflect that she did take some liquids last night.  Nutrition assessment also found that the patient has basically lost 40 pounds since 11/17/2017 when she weighed 150 pounds.  Her weight on 03/18/2020 was 110 pounds.  On 8/29 it was recorded as 113 pounds.  I did speak with her sister who is her guardian yesterday.  She has been out of the loop because she has been diagnosed with cancer and is been receiving radiation treatment.  She is to start chemotherapy next week.  The patient lives with her mother.  According to the guardian she stated that the mother had told her that the patient had been eating most recently.  Reportedly when they picked her up for involuntary commitment she was cooking food.  We discussed the possibility of discharging her home given she has the Haldol decanoate in her system whether she would eat with food she had prepared.  Unfortunately the patient stated that she is on confinement right now because of her radiation treatment and about to start chemotherapy, and with the Covid pandemic she is unable to really go and visit her sister and mother.  This morning she continues to not eat.  She continues to state that her sister is not her sister, and that her mother is not her mother.  She has received the 200 mg total of Haldol decanoate over  the last 5 to 7 days.  She continues with Haldol 10 mg p.o. or IM twice daily.  Her blood pressure today initially is 96/64, but she is mildly orthostatic with the repeat pressure of 82/59.  Her heart rate increased from 72-101.  She only slept 4.5 hours last night.  Her blood sugar this morning is 81.  Principal Problem: <principal problem not specified> Diagnosis: Active Problems:   Schizophrenia (HCC)  Total Time spent with patient: 20 minutes  Past Psychiatric History: See admission H&P.  Of [facility] on [facility]/17/2018 was after she had been hospitalized at [facility] from 08/02/2020 09/17/2018.  Past Medical History:  Past Medical History:  Diagnosis Date  . Non compliance w medication regimen   . Schizophrenia (HCC)    History reviewed. No pertinent surgical history. Family History: History reviewed. No pertinent family history. Family Psychiatric  History: See admission H&P Social History:  Social History   Substance and Sexual Activity  Alcohol Use Yes   Comment: Refuses to disclose how much     Social History   Substance and Sexual Activity  Drug Use No   Comment: Refuses to answer    Social History   Socioeconomic History  . Marital status: Single    Spouse name: Not on file  . Number of children: Not on file  . Years of education: Not on file  . Highest education level: Not on file  Occupational History  .  Not on file  Tobacco Use  . Smoking status: Current Some Day Smoker    Types: Cigarettes  . Smokeless tobacco: Never Used  Vaping Use  . Vaping Use: Every day  Substance and Sexual Activity  . Alcohol use: Yes    Comment: Refuses to disclose how much  . Drug use: No    Comment: Refuses to answer  . Sexual activity: Not on file    Comment: refused to answer  Other Topics Concern  . Not on file  Social History Narrative  . Not on file   Social Determinants of Health    Financial Resource Strain:   . Difficulty of Paying Living Expenses: Not on file  Food Insecurity:   . Worried About Programme researcher, broadcasting/film/video in the Last Year: Not on file  . Ran Out of Food in the Last Year: Not on file  Transportation Needs:   . Lack of Transportation (Medical): Not on file  . Lack of Transportation (Non-Medical): Not on file  Physical Activity:   . Days of Exercise per Week: Not on file  . Minutes of Exercise per Session: Not on file  Stress:   . Feeling of Stress : Not on file  Social Connections:   . Frequency of Communication with Friends and Family: Not on file  . Frequency of Social Gatherings with Friends and Family: Not on file  . Attends Religious Services: Not on file  . Active Member of Clubs or Organizations: Not on file  . Attends Banker Meetings: Not on file  . Marital Status: Not on file   Additional Social History:                         Sleep: Fair  Appetite:  Poor  Current Medications: Current Facility-Administered Medications  Medication Dose Route Frequency Provider Last Rate Last Admin  . acetaminophen (TYLENOL) tablet 650 mg  650 mg Oral Q6H PRN Antonieta Pert, MD      . alum & mag hydroxide-simeth (MAALOX/MYLANTA) 200-200-20 MG/5ML suspension 30 mL  30 mL Oral Q4H PRN Antonieta Pert, MD      . haloperidol (HALDOL) tablet 5 mg  5 mg Oral Q6H PRN Antonieta Pert, MD       And  . benztropine (COGENTIN) tablet 1 mg  1 mg Oral Q6H PRN Antonieta Pert, MD      . feeding supplement (BOOST / RESOURCE BREEZE) liquid 1 Container  1 Container Oral BID BM Antonieta Pert, MD      . feeding supplement (ENSURE ENLIVE) (ENSURE ENLIVE) liquid 237 mL  237 mL Oral BID BM Antonieta Pert, MD      . haloperidol (HALDOL) tablet 10 mg  10 mg Oral BID Antonieta Pert, MD       Or  . haloperidol lactate (HALDOL) injection 10 mg  10 mg Intramuscular BID Antonieta Pert, MD   10 mg at 03/19/20 0818  .  hydrOXYzine (ATARAX/VISTARIL) tablet 25 mg  25 mg Oral TID PRN Antonieta Pert, MD      . magnesium hydroxide (MILK OF MAGNESIA) suspension 30 mL  30 mL Oral Daily PRN Antonieta Pert, MD      . megestrol (MEGACE) tablet 320 mg  320 mg Oral Daily Antonieta Pert, MD      . mirtazapine (REMERON) tablet 7.5 mg  7.5 mg Oral QHS Antonieta Pert, MD      .  multivitamin with minerals tablet 1 tablet  1 tablet Oral Daily Antonieta Pert, MD      . traZODone (DESYREL) tablet 50 mg  50 mg Oral QHS PRN Antonieta Pert, MD        Lab Results:  Results for orders placed or performed during the hospital encounter of 03/15/20 (from the past 48 hour(s))  Glucose, capillary     Status: None   Collection Time: 03/18/20  6:27 AM  Result Value Ref Range   Glucose-Capillary 91 70 - 99 mg/dL    Comment: Glucose reference range applies only to samples taken after fasting for at least 8 hours.  Glucose, capillary     Status: None   Collection Time: 03/19/20  6:19 AM  Result Value Ref Range   Glucose-Capillary 81 70 - 99 mg/dL    Comment: Glucose reference range applies only to samples taken after fasting for at least 8 hours.    Blood Alcohol level:  Lab Results  Component Value Date   ETH <10 02/29/2020   ETH <10 10/04/2019    Metabolic Disorder Labs: Lab Results  Component Value Date   HGBA1C 5.7 (H) 03/14/2020   MPG 116.89 03/14/2020   MPG 128 04/16/2015   Lab Results  Component Value Date   PROLACTIN 13.6 04/16/2015   Lab Results  Component Value Date   CHOL 186 04/16/2015   TRIG 64 04/16/2015   HDL 55 04/16/2015   CHOLHDL 3.4 04/16/2015   VLDL 13 04/16/2015   LDLCALC 118 (H) 04/16/2015    Physical Findings: AIMS: Facial and Oral Movements Muscles of Facial Expression: None, normal Lips and Perioral Area: None, normal Jaw: None, normal Tongue: None, normal,Extremity Movements Upper (arms, wrists, hands, fingers): None, normal Lower (legs, knees, ankles, toes):  None, normal, Trunk Movements Neck, shoulders, hips: None, normal, Overall Severity Severity of abnormal movements (highest score from questions above): None, normal Incapacitation due to abnormal movements: None, normal Patient's awareness of abnormal movements (rate only patient's report): No Awareness, Dental Status Current problems with teeth and/or dentures?: No Does patient usually wear dentures?: No  CIWA:    COWS:     Musculoskeletal: Strength & Muscle Tone: decreased Gait & Station: shuffle Patient leans: [name]  Psychiatric Specialty Exam: Physical Exam Vitals and nursing note reviewed.  HENT:     Head: Normocephalic and atraumatic.  Pulmonary:     Effort: Pulmonary effort is normal.  Neurological:     General: No focal deficit present.     Mental Status: She is alert.     Review of Systems  Blood pressure (!) 82/59, pulse (!) 101, temperature 98.6 F (37 C), temperature source Oral, resp. rate 18, weight 49.9 kg, SpO2 99 %.Body mass index is 18.88 kg/m.  General Appearance: Disheveled  Eye Contact:  Poor  Speech:  Normal Rate  Volume:  Decreased  Mood:  Dysphoric  Affect:  Depressed  Thought Process:  Goal Directed and Descriptions of Associations: Loose  Orientation:  Negative  Thought Content:  Paranoid Ideation  Suicidal Thoughts:  No  Homicidal Thoughts:  No  Memory:  Immediate;   Poor Recent;   Poor Remote;   Poor  Judgement:  Impaired  Insight:  Lacking  Psychomotor Activity:  Decreased  Concentration:  Concentration: Fair and Attention Span: Fair  Recall:  [employer] of Knowledge:  Fair  Language:  Fair  Akathisia:  Negative  Handed:  Right  AIMS (if indicated):     Assets:  Desire  for Improvement Resilience  ADL's:  Impaired  Cognition:  WNL  Sleep:  Number of Hours: 4.5     Treatment Plan Summary: Daily contact with patient to assess and evaluate symptoms and progress in treatment, Medication management and Plan : Patient is seen and  examined.  Patient is a 59 year old female with the above-stated past psychiatric history who is seen in follow-up.   Diagnosis: 1.  Schizophrenia. 2.  Anorexia secondary to paranoia from schizophrenia.  Pertinent findings on examination today: 1.  Paranoia continues. 2.  Nutrition assessment shows at least a 40 pound weight loss over the last year. 3.  P.o. intake is still very poor. 4.  Patient has [facility] and is received IV fluids on 2 occasions so far, but she needs protein supplementation somewhere another. 5.  ECT could be considered, but we do not have resources for her to receive ECT within our facility.  Plan: 1.  Change Haldol to 15 mg p.o. nightly and stop daytime Haldol for now.  This is for psychosis. 2.  Change Megace to 160 mg p.o. twice daily.  Patient refused 320 mg dose.  This is for appetite stimulation. 3.  Continue mirtazapine 7.5 mg p.o. nightly for sleep and appetite stimulation. 4.  Continue Haldol as needed for agitation. 5.  Continue hydroxyzine 25 mg p.o. 3 times daily as needed anxiety. 6.  Continue multivitamin 1 tablet p.o. daily for nutritional supplementation. 7.  We will continue the recommendations per nutrition, but patient has been refusing most of these. 8.  Continue trazodone 50 mg p.o. nightly as needed insomnia. 9.  Continue blood sugar checks in a.m. 10.  [facility] level of care given the significance of her illness as well as the need for possible tube feeds given her anorexia. 11.  See above regarding sister/guardian's medical status. 12.  Disposition planning-in progress.  Antonieta PertGreg Lawson Kenderick Kobler, MD 03/19/2020, 11:01 AM

## 2020-03-19 NOTE — Progress Notes (Signed)
Psychoeducational Group Note  Date:  03/19/2020 Time:  2035  Group Topic/Focus:  Wrap-Up Group:   The focus of this group is to help patients review their daily goal of treatment and discuss progress on daily workbooks.  Participation Level: Did Not Attend  Participation Quality:  Not Applicable  Affect:  Not Applicable  Cognitive:  Not Applicable  Insight:  Not Applicable  Engagement in Group: Not Applicable  Additional Comments:  The patient did not attend group this evening.   Hazle Coca S 03/19/2020, 8:35 PM

## 2020-03-19 NOTE — BHH Counselor (Signed)
CSW faxed updated progress notes to [facility]. This patient remains on the [facility] wait list.     Ruthann Cancer MSW, LCSW Clincal Social Worker  [facility]

## 2020-03-20 LAB — GLUCOSE, CAPILLARY: Glucose-Capillary: 82 mg/dL (ref 70–99)

## 2020-03-20 MED ORDER — MEGESTROL ACETATE 40 MG PO TABS
40.0000 mg | ORAL_TABLET | Freq: Four times a day (QID) | ORAL | Status: DC
  Administered 2020-03-20 – 2020-03-25 (×18): 40 mg via ORAL
  Filled 2020-03-20 (×33): qty 1

## 2020-03-20 MED ORDER — LUMATEPERONE TOSYLATE 42 MG PO CAPS
42.0000 mg | ORAL_CAPSULE | Freq: Every day | ORAL | Status: DC
  Administered 2020-03-21 – 2020-03-25 (×4): 42 mg via ORAL

## 2020-03-20 MED ORDER — MIRTAZAPINE 30 MG PO TABS
30.0000 mg | ORAL_TABLET | Freq: Every day | ORAL | Status: DC
  Administered 2020-03-20 – 2020-03-24 (×4): 30 mg via ORAL
  Filled 2020-03-20: qty 1
  Filled 2020-03-20: qty 2
  Filled 2020-03-20 (×6): qty 1

## 2020-03-20 NOTE — Progress Notes (Signed)
[facility] MD Progress Note  03/20/2020 2:49 PM [name]  MRN:  [id number] Subjective: Patient is a 59 year old female with a past psychiatric history significant for schizophrenia who was admitted on 03/02/2020 secondary to worsening psychotic symptoms and noncompliance with her schizophrenic medications.  Objective: Patient is seen and examined.  Patient is a 59 year old female with the above-stated past psychiatric history seen in follow-up.  She is essentially unchanged.  We had a conversation today.  I gave her the option of taking 1 Ensure bottle 3 times a day versus the potential for having to be transferred to the medical hospital for IV fluids or transfer [facility] for more long-term care, but she declined all these options.  She is unable to see what is going on.  She stated that she wants to ", and she just wants to go home.  I asked her if she would drink 3 ensures a day for a day or 2 I would let her go home", but she has continued to be paranoid and think her family members are substituted.  We have been trying to get her transferred to [facility].  She was there in 2020 and it sounds like when she was at [facility] these were similar circumstances.  She was at [facility] for approximately 4 weeks prior to transfer to [facility]..  Staff does state that she is drinking Gatorade.  This morning her blood pressure was 81/59, pulse was 74.  Repeat showed a pulse of 102 and her blood pressure dropped to 74/52.  She did sleep 8.75 hours last night.  Unfortunately her daily weight was not recorded.  No new laboratories.  Principal Problem: <principal problem not specified> Diagnosis: Active Problems:   Schizophrenia (HCC)  Total Time spent with patient: 20 minutes  Past Psychiatric History: See admission H&P  Past Medical History:  Past Medical History:  Diagnosis Date  . Non compliance w medication regimen   . Schizophrenia (HCC)    History reviewed. No  pertinent surgical history. Family History: History reviewed. No pertinent family history. Family Psychiatric  History: See admission H&P Social History:  Social History   Substance and Sexual Activity  Alcohol Use Yes   Comment: Refuses to disclose how much     Social History   Substance and Sexual Activity  Drug Use No   Comment: Refuses to answer    Social History   Socioeconomic History  . Marital status: Single    Spouse name: Not on file  . Number of children: Not on file  . Years of education: Not on file  . Highest education level: Not on file  Occupational History  . Not on file  Tobacco Use  . Smoking status: Current Some Day Smoker    Types: Cigarettes  . Smokeless tobacco: Never Used  Vaping Use  . Vaping Use: Every day  Substance and Sexual Activity  . Alcohol use: Yes    Comment: Refuses to disclose how much  . Drug use: No    Comment: Refuses to answer  . Sexual activity: Not on file    Comment: refused to answer  Other Topics Concern  . Not on file  Social History Narrative  . Not on file   Social Determinants of Health   Financial Resource Strain:   . Difficulty of Paying Living Expenses: Not on file  Food Insecurity:   . Worried About Programme researcher, broadcasting/film/video in the Last Year: Not on file  . Ran Out  of Food in the Last Year: Not on file  Transportation Needs:   . Lack of Transportation (Medical): Not on file  . Lack of Transportation (Non-Medical): Not on file  Physical Activity:   . Days of Exercise per Week: Not on file  . Minutes of Exercise per Session: Not on file  Stress:   . Feeling of Stress : Not on file  Social Connections:   . Frequency of Communication with Friends and Family: Not on file  . Frequency of Social Gatherings with Friends and Family: Not on file  . Attends Religious Services: Not on file  . Active Member of Clubs or Organizations: Not on file  . Attends Banker Meetings: Not on file  . Marital Status:  Not on file   Additional Social History:                         Sleep: Good  Appetite:  Poor  Current Medications: Current Facility-Administered Medications  Medication Dose Route Frequency Provider Last Rate Last Admin  . acetaminophen (TYLENOL) tablet 650 mg  650 mg Oral Q6H PRN Antonieta Pert, MD      . alum & mag hydroxide-simeth (MAALOX/MYLANTA) 200-200-20 MG/5ML suspension 30 mL  30 mL Oral Q4H PRN Antonieta Pert, MD      . haloperidol (HALDOL) tablet 5 mg  5 mg Oral Q6H PRN Antonieta Pert, MD       And  . benztropine (COGENTIN) tablet 1 mg  1 mg Oral Q6H PRN Antonieta Pert, MD      . feeding supplement (BOOST / RESOURCE BREEZE) liquid 1 Container  1 Container Oral BID BM Antonieta Pert, MD   1 Container at 03/19/20 1504  . feeding supplement (ENSURE ENLIVE) (ENSURE ENLIVE) liquid 237 mL  237 mL Oral BID BM Antonieta Pert, MD      . haloperidol (HALDOL) tablet 15 mg  15 mg Oral QHS Antonieta Pert, MD       Or  . haloperidol lactate (HALDOL) injection 15 mg  15 mg Intramuscular QHS Antonieta Pert, MD      . hydrOXYzine (ATARAX/VISTARIL) tablet 25 mg  25 mg Oral TID PRN Antonieta Pert, MD      . magnesium hydroxide (MILK OF MAGNESIA) suspension 30 mL  30 mL Oral Daily PRN Antonieta Pert, MD      . megestrol (MEGACE) tablet 160 mg  160 mg Oral BID Antonieta Pert, MD   160 mg at 03/20/20 0759  . mirtazapine (REMERON) tablet 7.5 mg  7.5 mg Oral QHS Antonieta Pert, MD      . multivitamin with minerals tablet 1 tablet  1 tablet Oral Daily Antonieta Pert, MD      . traZODone (DESYREL) tablet 50 mg  50 mg Oral QHS PRN Antonieta Pert, MD        Lab Results:  Results for orders placed or performed during the hospital encounter of 03/15/20 (from the past 48 hour(s))  Glucose, capillary     Status: None   Collection Time: 03/19/20  6:19 AM  Result Value Ref Range   Glucose-Capillary 81 70 - 99 mg/dL    Comment: Glucose  reference range applies only to samples taken after fasting for at least 8 hours.  Glucose, capillary     Status: None   Collection Time: 03/20/20  5:47 AM  Result Value Ref Range  Glucose-Capillary 82 70 - 99 mg/dL    Comment: Glucose reference range applies only to samples taken after fasting for at least 8 hours.    Blood Alcohol level:  Lab Results  Component Value Date   ETH <10 02/29/2020   ETH <10 10/04/2019    Metabolic Disorder Labs: Lab Results  Component Value Date   HGBA1C 5.7 (H) 03/14/2020   MPG 116.89 03/14/2020   MPG 128 04/16/2015   Lab Results  Component Value Date   PROLACTIN 13.6 04/16/2015   Lab Results  Component Value Date   CHOL 186 04/16/2015   TRIG 64 04/16/2015   HDL 55 04/16/2015   CHOLHDL 3.4 04/16/2015   VLDL 13 04/16/2015   LDLCALC 118 (H) 04/16/2015    Physical Findings: AIMS: Facial and Oral Movements Muscles of Facial Expression: None, normal Lips and Perioral Area: None, normal Jaw: None, normal Tongue: None, normal,Extremity Movements Upper (arms, wrists, hands, fingers): None, normal Lower (legs, knees, ankles, toes): None, normal, Trunk Movements Neck, shoulders, hips: None, normal, Overall Severity Severity of abnormal movements (highest score from questions above): None, normal Incapacitation due to abnormal movements: None, normal Patient's awareness of abnormal movements (rate only patient's report): No Awareness, Dental Status Current problems with teeth and/or dentures?: No Does patient usually wear dentures?: No  CIWA:    COWS:     Musculoskeletal: Strength & Muscle Tone: decreased Gait & Station: normal Patient leans: [name]  Psychiatric Specialty Exam: Physical Exam Vitals and nursing note reviewed.  Constitutional:      Appearance: Normal appearance.  HENT:     Head: Normocephalic and atraumatic.  Pulmonary:     Effort: Pulmonary effort is normal.  Neurological:     General: No focal deficit present.      Mental Status: She is alert.     Review of Systems  Blood pressure (!) 74/52, pulse (!) 102, temperature 98.2 F (36.8 C), temperature source Oral, resp. rate 18, weight 49.9 kg, SpO2 99 %.Body mass index is 18.88 kg/m.  General Appearance: Disheveled  Eye Contact:  Fair  Speech:  Normal Rate  Volume:  Normal  Mood:  Irritable  Affect:  Congruent  Thought Process:  Goal Directed and Descriptions of Associations: Loose  Orientation:  Full (Time, Place, and Person)  Thought Content:  Delusions and Paranoid Ideation  Suicidal Thoughts:  No  Homicidal Thoughts:  No  Memory:  Immediate;   Poor Recent;   Poor Remote;   Poor  Judgement:  Impaired  Insight:  Lacking  Psychomotor Activity:  Decreased  Concentration:  Concentration: Fair and Attention Span: Fair  Recall:  Poor  Fund of Knowledge:  Fair  Language:  Good  Akathisia:  Negative  Handed:  Right  AIMS (if indicated):     Assets:  Desire for Improvement Resilience  ADL's:  Impaired  Cognition:  WNL  Sleep:  Number of Hours: 8.75     Treatment Plan Summary: Daily contact with patient to assess and evaluate symptoms and progress in treatment, Medication management and Plan : Patient is seen and examined.  Patient is a 59 year old female with the above-stated past psychiatric history who is seen in follow-up.   Diagnosis: 1.  Schizophrenia 2.  Anorexia secondary to paranoia from schizophrenia 3.  Beginning to have hypotension again.  Pertinent findings on examination today: 1.  Essentially unchanged paranoia. 2.  Patient is willing to drink Gatorade at times, but unwilling to take any protein source.  Plan: 1.  Supplemental haloperidol  to the long-acting haloperidol injection does not appear to be helping. 2.  Stop supplemental Haldol. 3.  Patient previously had received multiple antipsychotics at [facility] as well as at [facility].  It did appear that the long-acting Haldol injection was most effective. 4.   Patient received the long-acting paliperidone injection early on in the hospitalization.  The combination with the Haldol is really showing no benefit. 5.  We will attempt to give Caplyta 42 mg p.o. daily for treatment resistant schizophrenia. 6.  Change Megace to 40 mg p.o. 3 times daily.  She has been resistant to taking it at the larger dosages. 7.  Increase mirtazapine to 30 mg p.o. nightly for appetite stimulation, sleep and mood. 8.  Continue daily blood sugar measures. 9.  Continue trazodone 50 mg p.o. nightly as needed insomnia. 10.  If low blood pressure continues we may have to send her to [facility] for additional IV fluids. 11.  We will continue to attempt to get her transferred to [facility]. 12.  Disposition planning-in progress.  Antonieta Pert, MD 03/20/2020, 2:49 PM

## 2020-03-20 NOTE — Progress Notes (Signed)
Pt continues to isolate in room, pt drinks Gatorade at times, continues to refuse food.     03/20/20 0000  Psych Admission Type (Psych Patients Only)  Admission Status Involuntary  Psychosocial Assessment  Patient Complaints Irritability;Restlessness  Eye Contact Avertive;Brief  Facial Expression Angry;Anxious;Pensive;Worried  Affect Angry;Anxious;Apathetic;Irritable;Preoccupied  Speech Logical/coherent;Soft  Interaction Avoidant;Cautious;Forwards little;Guarded;Minimal  Motor Activity Slow  Appearance/Hygiene Poor hygiene;Body odor  Behavior Characteristics Unwilling to participate  Mood Labile  Thought Process  Coherency Concrete thinking  Content Blaming others  Delusions Paranoid  Perception WDL  Hallucination None reported or observed  Judgment Poor  Confusion Moderate  Danger to Self  Current suicidal ideation? Denies  Danger to Others  Danger to Others None reported or observed

## 2020-03-20 NOTE — Progress Notes (Signed)
Dar Note: Patient presents with a flat affect and mood.  Denies suicidal thoughts, auditory and visual hallucinations.  Patient continues to refuse oral medication and food.  Offered and given 1200 ml Gatorade.  Routine safety checks maintained.  Patient is safe on the unit.

## 2020-03-21 LAB — CBC
HCT: 43.7 % (ref 36.0–46.0)
Hemoglobin: 14.6 g/dL (ref 12.0–15.0)
MCH: 28.2 pg (ref 26.0–34.0)
MCHC: 33.4 g/dL (ref 30.0–36.0)
MCV: 84.5 fL (ref 80.0–100.0)
Platelets: 213 10*3/uL (ref 150–400)
RBC: 5.17 MIL/uL — ABNORMAL HIGH (ref 3.87–5.11)
RDW: 12.1 % (ref 11.5–15.5)
WBC: 6.3 10*3/uL (ref 4.0–10.5)
nRBC: 0 % (ref 0.0–0.2)

## 2020-03-21 LAB — BASIC METABOLIC PANEL
Anion gap: 12 (ref 5–15)
BUN: <5 mg/dL — ABNORMAL LOW (ref 6–20)
CO2: 31 mmol/L (ref 22–32)
Calcium: 9.2 mg/dL (ref 8.9–10.3)
Chloride: 95 mmol/L — ABNORMAL LOW (ref 98–111)
Creatinine, Ser: 0.69 mg/dL (ref 0.44–1.00)
GFR calc Af Amer: >60 mL/min (ref >60)
GFR calc non Af Amer: >60 mL/min (ref >60)
Glucose, Bld: 96 mg/dL (ref 70–99)
Potassium: 2.4 mmol/L — CL (ref 3.5–5.1)
Sodium: 138 mmol/L (ref 135–145)

## 2020-03-21 LAB — GLUCOSE, CAPILLARY: Glucose-Capillary: 149 mg/dL — ABNORMAL HIGH (ref 70–99)

## 2020-03-21 LAB — TROPONIN I (HIGH SENSITIVITY): Troponin I (High Sensitivity): 5 ng/L (ref <18)

## 2020-03-21 LAB — I-STAT BETA HCG BLOOD, ED (MC, WL, AP ONLY): I-stat hCG, quantitative: <5 m[IU]/mL (ref <5)

## 2020-03-21 MED ORDER — POTASSIUM CHLORIDE 10 MEQ/100ML IV SOLN
10.0000 meq | INTRAVENOUS | Status: AC
  Administered 2020-03-22 (×2): 10 meq via INTRAVENOUS
  Filled 2020-03-21 (×2): qty 100

## 2020-03-21 MED ORDER — POTASSIUM CHLORIDE CRYS ER 20 MEQ PO TBCR
40.0000 meq | EXTENDED_RELEASE_TABLET | Freq: Once | ORAL | Status: AC
  Administered 2020-03-22: 40 meq via ORAL
  Filled 2020-03-21: qty 2

## 2020-03-21 MED ORDER — SODIUM CHLORIDE 0.9 % IV BOLUS
1000.0000 mL | Freq: Once | INTRAVENOUS | Status: AC
  Administered 2020-03-22: 1000 mL via INTRAVENOUS

## 2020-03-21 MED ORDER — DRONABINOL 2.5 MG PO CAPS
2.5000 mg | ORAL_CAPSULE | Freq: Two times a day (BID) | ORAL | Status: DC
  Administered 2020-03-22 – 2020-03-23 (×4): 2.5 mg via ORAL
  Filled 2020-03-21 (×5): qty 1

## 2020-03-21 NOTE — Progress Notes (Signed)
Dar Note: Patient appears frail and weak.  Medication given as prescribed.  Denies suicidal thoughts, auditory and visual hallucinations.  Blood pressure result of 94/74 sitting, and 66/ 53 standing.  Food and fluid intake encouraged.  600 ml Gatorade taken but still refused to eat.  Safety checks maintained every 15 minutes.

## 2020-03-21 NOTE — [facility] Group Notes (Signed)
Occupational Therapy Group Note Date: 03/21/2020 Group Topic/Focus: Stress Management  Group Description: Group encouraged increased participation and engagement through discussion focused on topic of stress management. Patients individually completed a worksheet that identified how one define's stress, what stress looks like, and healthy vs unhealthy strategies that one uses to manage. Discussion focused on patient's sharing their responses and following discussion, engaged in a stress management strategy of progressive muscle relaxation (PMR). Patients shared how they were feeling post exercise.  Participation Level: Non-verbal and Minimal   Participation Quality: Moderate Cues   Behavior: Calm, Sedated and Withdrawn   Speech/Thought Process: Barely audible   Affect/Mood: Constricted   Insight: Limited   Judgement: Limited   Individualization: [name] sat quietly for the majority of group, min verbal participation observed and often required prompting from this writer to engage. Pt identified stress to her as "pressure from untimely events" and identified "getting a headache, pacing, over-eating, and over-sleeping" as all physical signs and strategies. Pt engaged in 100% of presented PMR exercises and noted benefit post group.   Modes of Intervention: Activity, Discussion and Education  Patient Response to Interventions:  Attentive and Engaged   Plan: Continue to engage patient in OT groups 2 - 3x/week.  03/21/2020  Donne Hazel, MOT, OTR/L

## 2020-03-21 NOTE — ED Provider Notes (Signed)
[facility]-EMERGENCY DEPT Provider Note   CSN: [id number] Arrival date & time: 03/21/20  1533     History Chief Complaint  Patient presents with  . Paranoid    [name] is a 59 y.o. female.  Patient with a history of paranoid schizophrenia, admitted to [facility] under IVC on 8/14, sent to ED tonight for treatment of hypotension. She apparently refuses to eat and is drinking very little. She has exhibited similar behavior in the past. Blood pressures recorded 03/20/20 have been 81/59 and 74/52, pulse up to 102. No vomiting. She is currently waiting for placement to [facility] for further treatment of her paranoid schizophrenia.   The history is provided by medical records and a caregiver. No language interpreter was used.       Past Medical History:  Diagnosis Date  . Non compliance w medication regimen   . Schizophrenia [facility])     Patient Active Problem List   Diagnosis Date Noted  . Hypoglycemia 03/14/2020  . Paranoid schizophrenia (HCC) 03/01/2020  . Schizophrenia (HCC) 11/26/2017  . Noncompliance with diet and medication regimen   . Psychoses (HCC)   . Hypokalemic alkalosis 11/11/2011  . Medically noncompliant 11/11/2011  . Schizophrenia, paranoid (HCC) 11/07/2011    History reviewed. No pertinent surgical history.   OB History   No obstetric history on file.     History reviewed. No pertinent family history.  Social History   Tobacco Use  . Smoking status: Current Some Day Smoker    Types: Cigarettes  . Smokeless tobacco: Never Used  Vaping Use  . Vaping Use: Every day  Substance Use Topics  . Alcohol use: Yes    Comment: Refuses to disclose how much  . Drug use: No    Comment: Refuses to answer    Home Medications Prior to Admission medications   Medication Sig Start Date End Date Taking? Authorizing Provider  benztropine (COGENTIN) 0.5 MG tablet Take 1 tablet (0.5 mg total) by mouth 2 (two) times daily as needed for  tremors. 03/15/20   Burnadette Pop, MD  haloperidol (HALDOL) 10 MG tablet Take 1 tablet (10 mg total) by mouth 3 (three) times daily. 03/15/20   Burnadette Pop, MD  hydrOXYzine (ATARAX/VISTARIL) 25 MG tablet Take 1 tablet (25 mg total) by mouth 3 (three) times daily as needed for anxiety. 03/15/20   Burnadette Pop, MD  megestrol (MEGACE) 40 MG tablet Take 1 tablet (40 mg total) by mouth daily. 03/15/20   Burnadette Pop, MD  paliperidone (INVEGA) 6 MG 24 hr tablet Take 1 tablet (6 mg total) by mouth at bedtime. 03/15/20   Burnadette Pop, MD  traZODone (DESYREL) 50 MG tablet Take 1 tablet (50 mg total) by mouth at bedtime. 03/15/20   Burnadette Pop, MD    Allergies    Aspirin and Geodon [ziprasidone hcl]  Review of Systems   Review of Systems  Unable to perform ROS: Psychiatric disorder    Physical Exam Updated Vital Signs BP 93/72   Pulse 79   Temp 97.6 F (36.4 C) (Oral)   Resp 17   Wt 48.5 kg   SpO2 99%   BMI 18.37 kg/m   Physical Exam Vitals and nursing note reviewed.  Constitutional:      General: She is not in acute distress. Cardiovascular:     Rate and Rhythm: Normal rate and regular rhythm.  Pulmonary:     Effort: Pulmonary effort is normal.     Breath sounds: No wheezing, rhonchi  or rales.  Abdominal:     General: Abdomen is flat.     Tenderness: There is no abdominal tenderness.  Skin:    General: Skin is warm and dry.     ED Results / Procedures / Treatments   Labs (all labs ordered are listed, but only abnormal results are displayed) Labs Reviewed  GLUCOSE, CAPILLARY - Abnormal; Notable for the following components:      Result Value   Glucose-Capillary 106 (*)    All other components within normal limits  GLUCOSE, CAPILLARY - Abnormal; Notable for the following components:   Glucose-Capillary 149 (*)    All other components within normal limits  BASIC METABOLIC PANEL - Abnormal; Notable for the following components:   Potassium 2.4 (*)    Chloride  95 (*)    BUN <5 (*)    All other components within normal limits  CBC - Abnormal; Notable for the following components:   RBC 5.17 (*)    All other components within normal limits  GLUCOSE, CAPILLARY  GLUCOSE, CAPILLARY  GLUCOSE, CAPILLARY  GLUCOSE, CAPILLARY  I-STAT BETA HCG BLOOD, ED ([facility], [facility], [facility] ONLY)  TROPONIN I (HIGH SENSITIVITY)  TROPONIN I (HIGH SENSITIVITY)   Results for orders placed or performed during the hospital encounter of 03/15/20  Glucose, capillary  Result Value Ref Range   Glucose-Capillary 106 (H) 70 - 99 mg/dL  Glucose, capillary  Result Value Ref Range   Glucose-Capillary 94 70 - 99 mg/dL  Glucose, capillary  Result Value Ref Range   Glucose-Capillary 91 70 - 99 mg/dL  Glucose, capillary  Result Value Ref Range   Glucose-Capillary 81 70 - 99 mg/dL  Glucose, capillary  Result Value Ref Range   Glucose-Capillary 82 70 - 99 mg/dL  Glucose, capillary  Result Value Ref Range   Glucose-Capillary 149 (H) 70 - 99 mg/dL  Basic metabolic panel  Result Value Ref Range   Sodium 138 135 - 145 mmol/L   Potassium 2.4 (LL) 3.5 - 5.1 mmol/L   Chloride 95 (L) 98 - 111 mmol/L   CO2 31 22 - 32 mmol/L   Glucose, Bld 96 70 - 99 mg/dL   BUN <5 (L) 6 - 20 mg/dL   Creatinine, Ser 9.02 0.44 - 1.00 mg/dL   Calcium 9.2 8.9 - 40.9 mg/dL   GFR calc non Af Amer >60 >60 mL/min   GFR calc Af Amer >60 >60 mL/min   Anion gap 12 5 - 15  CBC  Result Value Ref Range   WBC 6.3 4.0 - 10.5 K/uL   RBC 5.17 (H) 3.87 - 5.11 MIL/uL   Hemoglobin 14.6 12.0 - 15.0 g/dL   HCT 73.5 36 - 46 %   MCV 84.5 80.0 - 100.0 fL   MCH 28.2 26.0 - 34.0 pg   MCHC 33.4 30.0 - 36.0 g/dL   RDW 32.9 92.4 - 26.8 %   Platelets 213 150 - 400 K/uL   nRBC 0.00 0.0 - 0.2 %  Basic metabolic panel  Result Value Ref Range   Sodium 141 135 - 145 mmol/L   Potassium 3.0 (L) 3.5 - 5.1 mmol/L   Chloride 101 98 - 111 mmol/L   CO2 27 22 - 32 mmol/L   Glucose, Bld 83 70 - 99 mg/dL   BUN <5 (L) 6 - 20 mg/dL    Creatinine, Ser 3.41 0.44 - 1.00 mg/dL   Calcium 8.0 (L) 8.9 - 10.3 mg/dL   GFR calc non Af Amer >60 >60  mL/min   GFR calc Af Amer >60 >60 mL/min   Anion gap 13 5 - 15  I-Stat beta hCG blood, ED  Result Value Ref Range   I-stat hCG, quantitative <5.0 <5 mIU/mL   Comment 3          Troponin I (High Sensitivity)  Result Value Ref Range   Troponin I (High Sensitivity) 5 <18 ng/L  Troponin I (High Sensitivity)  Result Value Ref Range   Troponin I (High Sensitivity) 4 <18 ng/L    EKG None  Radiology No results found.  Procedures Procedures (including critical care time)  Medications Ordered in ED Medications  acetaminophen (TYLENOL) tablet 650 mg (has no administration in time range)  alum & mag hydroxide-simeth (MAALOX/MYLANTA) 200-200-20 MG/5ML suspension 30 mL (has no administration in time range)  magnesium hydroxide (MILK OF MAGNESIA) suspension 30 mL (has no administration in time range)  hydrOXYzine (ATARAX/VISTARIL) tablet 25 mg (has no administration in time range)  traZODone (DESYREL) tablet 50 mg (has no administration in time range)  haloperidol (HALDOL) tablet 5 mg (has no administration in time range)    And  benztropine (COGENTIN) tablet 1 mg (has no administration in time range)  multivitamin with minerals tablet 1 tablet (1 tablet Oral Given 03/21/20 0734)  feeding supplement (ENSURE ENLIVE) (ENSURE ENLIVE) liquid 237 mL (237 mLs Oral Not Given 03/21/20 2303)  feeding supplement (BOOST / RESOURCE BREEZE) liquid 1 Container (1 Container Oral Not Given 03/21/20 0934)  megestrol (MEGACE) tablet 40 mg (40 mg Oral Not Given 03/21/20 2303)  mirtazapine (REMERON) tablet 30 mg (30 mg Oral Not Given 03/21/20 2304)  Lumateperone Tosylate CAPS 42 mg (42 mg Oral Given 03/21/20 0733)  dronabinol (MARINOL) capsule 2.5 mg (2.5 mg Oral Not Given 03/21/20 1513)  sodium chloride 0.9 % bolus 1,000 mL (has no administration in time range)  potassium chloride 10 mEq in 100 mL IVPB (has no  administration in time range)  potassium chloride SA (KLOR-CON) CR tablet 40 mEq (has no administration in time range)  haloperidol decanoate (HALDOL DECANOATE) 100 MG/ML injection 100 mg (100 mg Intramuscular Given 03/15/20 1840)    ED Course  I have reviewed the triage vital signs and the nursing notes.  Pertinent labs & imaging results that were available during my care of the patient were reviewed by me and considered in my medical decision making (see chart for details).    MDM Rules/Calculators/A&P                          [facility], history of same refusing most PO intake.   She is alert, appears fatigued but awake and responsive to questions. IV fluids ordered. Potassium is low at 2.4 so IV potassium was provided as well as PO, which she took cooperatively. Recheck of potassium after supplementation was 3.0. [facility]67.  [facility]. She confered with Nira ConnJason Berry, NP, who advised she should stay here until am psychiatric rounding with Dr. Tamera Puntleary at which time a decision would be made regarding psychiatric care. In the meantime, an additional IV potassium dose will be given.   Patient has been quiet, cooperative. She appears improved since arrival.   Final Clinical Impression(s) / ED Diagnoses Final diagnoses:  None   1. Hypokalemia 2. Hypotension 3. History of schizophrenia  Rx / DC Orders  ED Discharge Orders    None       Elpidio Anis, PA-C 03/22/20 0519    Paula Libra, MD 03/22/20 [phone]

## 2020-03-21 NOTE — ED Triage Notes (Signed)
Per [employer] RN, sent patient over-states she is hypotensive with a systolic pressure of 66-did not take manual pressure-states patient is not eating or drinking

## 2020-03-21 NOTE — Progress Notes (Signed)
[facility] MD Progress Note  03/21/2020 1:35 PM [name]  MRN:  [id number] Subjective:  Patient is a 59 year old female with a past psychiatric history significant for schizophrenia who was admitted on 03/02/2020 secondary to worsening psychotic symptoms and noncompliance with her schizophrenic medications.  Objective: Patient is seen and examined.  Patient is a 59 year old female with the above-stated past psychiatric history who is seen in follow-up.  Unfortunately she is essentially unchanged.  She did start taking the Caplyta yesterday.  Unfortunately this morning her blood pressure is a systolic less than 80.  We again discussed the need for nutritional intake.  Her paranoia continues.  Her blood sugar this morning was 149.  I have discussed with nursing to [facility], and for IV fluids to help restore her volume.  Her blood pressure early this a.m. was 66/53, pulse was 105.  Repeat showed air at 94/74 with a pulse of 68.  She is afebrile and pulse oximetry was 99%.  Her most recent weight from 9/2 was 48.53 kg.  She has taken her oral medications, and did take more fluid last night, but still is paranoid and refusing any Ensure or any solid food.  Discussion this morning with the team and the recommendation from pharmacy to perhaps use Marinol to stimulate appetite as well.  It may also disinhibit her to a degree that may be she would be more willing to eat.  No new laboratories today, but when transferred to the emergency room she will need metabolic work-up.  [facility] on an emergent basis.  Principal Problem: <principal problem not specified> Diagnosis: Active Problems:   Schizophrenia (HCC)  Total Time spent with patient: 20 minutes  Past Psychiatric History: See admission H&P  Past Medical History:  Past Medical History:   Diagnosis Date   Non compliance w medication regimen    Schizophrenia (HCC)    History reviewed. No pertinent surgical history. Family History: History reviewed. No pertinent family history. Family Psychiatric  History: See admission H&P Social History:  Social History   Substance and Sexual Activity  Alcohol Use Yes   Comment: Refuses to disclose how much     Social History   Substance and Sexual Activity  Drug Use No   Comment: Refuses to answer    Social History   Socioeconomic History   Marital status: Single    Spouse name: Not on file   Number of children: Not on file   Years of education: Not on file   Highest education level: Not on file  Occupational History   Not on file  Tobacco Use   Smoking status: Current Some Day Smoker    Types: Cigarettes   Smokeless tobacco: Never Used  Vaping Use   Vaping Use: Every day  Substance and Sexual Activity   Alcohol use: Yes    Comment: Refuses to disclose how much   Drug use: No    Comment: Refuses to answer   Sexual activity: Not on file    Comment: refused to answer  Other Topics Concern   Not on file  Social History Narrative   Not on file   Social Determinants of Health   Financial Resource Strain:    Difficulty of Paying Living Expenses: Not on file  Food Insecurity:    Worried About Running Out of Food  in the Last Year: Not on file   Ran Out of Food in the Last Year: Not on file  Transportation Needs:    Lack of Transportation (Medical): Not on file   Lack of Transportation (Non-Medical): Not on file  Physical Activity:    Days of Exercise per Week: Not on file   Minutes of Exercise per Session: Not on file  Stress:    Feeling of Stress : Not on file  Social Connections:    Frequency of Communication with Friends and Family: Not on file   Frequency of Social Gatherings with Friends and Family: Not on file   Attends Religious Services: Not on file   Active Member of  Clubs or Organizations: Not on file   Attends Banker Meetings: Not on file   Marital Status: Not on file   Additional Social History:                         Sleep: Fair  Appetite:  Poor  Current Medications: Current Facility-Administered Medications  Medication Dose Route Frequency Provider Last Rate Last Admin   acetaminophen (TYLENOL) tablet 650 mg  650 mg Oral Q6H PRN Antonieta Pert, MD       alum & mag hydroxide-simeth (MAALOX/MYLANTA) 200-200-20 MG/5ML suspension 30 mL  30 mL Oral Q4H PRN Antonieta Pert, MD       haloperidol (HALDOL) tablet 5 mg  5 mg Oral Q6H PRN Antonieta Pert, MD       And   benztropine (COGENTIN) tablet 1 mg  1 mg Oral Q6H PRN Antonieta Pert, MD       feeding supplement (BOOST / RESOURCE BREEZE) liquid 1 Container  1 Container Oral BID BM Antonieta Pert, MD   1 Container at 03/19/20 1504   feeding supplement (ENSURE ENLIVE) (ENSURE ENLIVE) liquid 237 mL  237 mL Oral BID BM Antonieta Pert, MD       hydrOXYzine (ATARAX/VISTARIL) tablet 25 mg  25 mg Oral TID PRN Antonieta Pert, MD       Lumateperone Tosylate CAPS 42 mg  42 mg Oral Daily Antonieta Pert, MD   42 mg at 03/21/20 1324   magnesium hydroxide (MILK OF MAGNESIA) suspension 30 mL  30 mL Oral Daily PRN Antonieta Pert, MD       megestrol (MEGACE) tablet 40 mg  40 mg Oral QID Antonieta Pert, MD   40 mg at 03/21/20 1142   mirtazapine (REMERON) tablet 30 mg  30 mg Oral QHS Antonieta Pert, MD   30 mg at 03/20/20 2035   multivitamin with minerals tablet 1 tablet  1 tablet Oral Daily Antonieta Pert, MD   1 tablet at 03/21/20 0734   traZODone (DESYREL) tablet 50 mg  50 mg Oral QHS PRN Antonieta Pert, MD        Lab Results:  Results for orders placed or performed during the hospital encounter of 03/15/20 (from the past 48 hour(s))  Glucose, capillary     Status: None   Collection Time: 03/20/20  5:47 AM  Result Value Ref  Range   Glucose-Capillary 82 70 - 99 mg/dL    Comment: Glucose reference range applies only to samples taken after fasting for at least 8 hours.  Glucose, capillary     Status: Abnormal   Collection Time: 03/21/20  5:56 AM  Result Value Ref Range   Glucose-Capillary 149 (H) 70 -  99 mg/dL    Comment: Glucose reference range applies only to samples taken after fasting for at least 8 hours.    Blood Alcohol level:  Lab Results  Component Value Date   ETH <10 02/29/2020   ETH <10 10/04/2019    Metabolic Disorder Labs: Lab Results  Component Value Date   HGBA1C 5.7 (H) 03/14/2020   MPG 116.89 03/14/2020   MPG 128 04/16/2015   Lab Results  Component Value Date   PROLACTIN 13.6 04/16/2015   Lab Results  Component Value Date   CHOL 186 04/16/2015   TRIG 64 04/16/2015   HDL 55 04/16/2015   CHOLHDL 3.4 04/16/2015   VLDL 13 04/16/2015   LDLCALC 118 (H) 04/16/2015    Physical Findings: AIMS: Facial and Oral Movements Muscles of Facial Expression: None, normal Lips and Perioral Area: None, normal Jaw: None, normal Tongue: None, normal,Extremity Movements Upper (arms, wrists, hands, fingers): None, normal Lower (legs, knees, ankles, toes): None, normal, Trunk Movements Neck, shoulders, hips: None, normal, Overall Severity Severity of abnormal movements (highest score from questions above): None, normal Incapacitation due to abnormal movements: None, normal Patient's awareness of abnormal movements (rate only patient's report): No Awareness, Dental Status Current problems with teeth and/or dentures?: No Does patient usually wear dentures?: No  CIWA:    COWS:     Musculoskeletal: Strength & Muscle Tone: decreased Gait & Station: normal Patient leans: [name]  Psychiatric Specialty Exam: Physical Exam Vitals and nursing note reviewed.  HENT:     Head: Normocephalic and atraumatic.  Pulmonary:     Effort: Pulmonary effort is normal.  Neurological:     General: No focal  deficit present.     Mental Status: She is alert.     Review of Systems  Blood pressure 94/74, pulse 68, temperature 97.8 F (36.6 C), temperature source Oral, resp. rate 16, weight 48.5 kg, SpO2 99 %.Body mass index is 18.37 kg/m.  General Appearance: Disheveled  Eye Contact:  Fair  Speech:  Normal Rate  Volume:  Decreased  Mood:  Dysphoric  Affect:  Depressed  Thought Process:  Goal Directed and Descriptions of Associations: Loose  Orientation:  Negative  Thought Content:  Delusions and Paranoid Ideation  Suicidal Thoughts:  No  Homicidal Thoughts:  No  Memory:  Immediate;   Poor Recent;   Poor Remote;   Poor  Judgement:  Impaired  Insight:  Lacking  Psychomotor Activity:  Decreased  Concentration:  Concentration: Fair and Attention Span: Fair  Recall:  [employer] of Knowledge:  Fair  Language:  Fair  Akathisia:  Negative  Handed:  Right  AIMS (if indicated):     Assets:  Desire for Improvement Resilience  ADL's:  Impaired  Cognition:  WNL  Sleep:  Number of Hours: 8.75     Treatment Plan Summary: Daily contact with patient to assess and evaluate symptoms and progress in treatment, Medication management and Plan : Patient is seen and examined.  Patient is a 59 year old female with the above-stated past psychiatric history who is seen in follow-up.   Diagnosis: 1.  Schizophrenia 2.  Anorexia secondary to paranoia from schizophrenia  Pertinent findings on examination today: 1.  Remains paranoid 2.  Remains anorexic 3.  Systolic blood pressure this morning less than 80 4.  Patient started on oral Caplyta yesterday  Plan: 1.  Transfer to [facility] emergency department for evaluation for IV fluids and consideration of a peripheral administration of hyperalimentation for nutritional status. 2.  Caplyta  42 mg p.o. daily for schizophrenia. 3.  Patient received the paliperidone long-acting injection early in the hospitalization for schizophrenia. 4.  Patient  received a total of Haldol decanoate 200 mg over the last 7 days for schizophrenia. 5.  Continue mirtazapine 30 mg p.o. nightly for sleep, mood and appetite stimulation. 6.  Continue Megace 40 mg p.o. 4 times daily for appetite stimulation. 7.  Start Marinol 2.5 mg p.o. twice daily for appetite stimulation 8.  Daily blood sugars 9.  Continue trazodone 50 mg p.o. nightly as needed insomnia. 10.  Disposition planning-in progress.  Antonieta Pert, MD 03/21/2020, 1:35 PM

## 2020-03-21 NOTE — Progress Notes (Signed)
Recreation Therapy Notes  Date: 9.2.21 Time: 0945 Location: [address] Dayroom    Group Topic: Communication, Team Building, Problem Solving  Goal Area(s) Addresses:  Patient will effectively work with peer towards shared goal.  Patient will identify skills used to make activity successful.  Patient will identify how skills used during activity can be used to reach post d/c goals.   Behavioral Response: None  Intervention: STEM Activity  Activity: Landing Pad. In teams patients were given 12 plastic drinking straws and a length of masking tape. Using the materials provided patients were asked to build a landing pad to catch a golf ball dropped from approximately 6 feet in the air.   Education: Pharmacist, community, Discharge Planning   Education Outcome: Acknowledges education/In group clarification offered/Needs additional education.   Clinical Observations/Feedback: Pt came late to group.  Pt sat and observed for a few minutes before returning to room.   [name], LRT/CTRS   [name] A 03/21/2020 10:58 AM

## 2020-03-21 NOTE — Tx Team (Signed)
Interdisciplinary Treatment and Diagnostic Plan Update  03/21/2020 Time of Session: 10:40AM [name] MRN: [id number]  Principal Diagnosis: <principal problem not specified>  Secondary Diagnoses: Active Problems:   Schizophrenia ([place])   Current Medications:  Current Facility-Administered Medications  Medication Dose Route Frequency Provider Last Rate Last Admin  . acetaminophen (TYLENOL) tablet 650 mg  650 mg Oral Q6H PRN [name], MD      . alum & mag hydroxide-simeth (MAALOX/MYLANTA) 200-200-20 MG/5ML suspension 30 mL  30 mL Oral Q4H PRN [name], MD      . haloperidol (HALDOL) tablet 5 mg  5 mg Oral Q6H PRN [name], MD       And  . benztropine (COGENTIN) tablet 1 mg  1 mg Oral Q6H PRN [name], MD      . feeding supplement (BOOST / RESOURCE BREEZE) liquid 1 Container  1 Container Oral BID BM [name], MD   1 Container at 03/19/20 1504  . feeding supplement (ENSURE ENLIVE) (ENSURE ENLIVE) liquid 237 mL  237 mL Oral BID BM [name], MD      . hydrOXYzine (ATARAX/VISTARIL) tablet 25 mg  25 mg Oral TID PRN [name], MD      . Lumateperone Tosylate CAPS 42 mg  42 mg Oral Daily [name], MD   42 mg at 03/21/20 0733  . magnesium hydroxide (MILK OF MAGNESIA) suspension 30 mL  30 mL Oral Daily PRN [name], MD      . megestrol (MEGACE) tablet 40 mg  40 mg Oral QID [name], MD   40 mg at 03/21/20 0733  . mirtazapine (REMERON) tablet 30 mg  30 mg Oral QHS [name], MD   30 mg at 03/20/20 2035  . multivitamin with minerals tablet 1 tablet  1 tablet Oral Daily [name], MD   1 tablet at 03/21/20 0734  . traZODone (DESYREL) tablet 50 mg  50 mg Oral QHS PRN [name], MD       PTA Medications: Medications Prior to Admission  Medication Sig Dispense Refill Last Dose  . benztropine (COGENTIN) 0.5 MG tablet Take 1 tablet (0.5 mg total) by mouth 2 (two) times daily as  needed for tremors.     . haloperidol (HALDOL) 10 MG tablet Take 1 tablet (10 mg total) by mouth 3 (three) times daily.     . hydrOXYzine (ATARAX/VISTARIL) 25 MG tablet Take 1 tablet (25 mg total) by mouth 3 (three) times daily as needed for anxiety. 30 tablet 0   . megestrol (MEGACE) 40 MG tablet Take 1 tablet (40 mg total) by mouth daily.     . paliperidone (INVEGA) 6 MG 24 hr tablet Take 1 tablet (6 mg total) by mouth at bedtime.     . traZODone (DESYREL) 50 MG tablet Take 1 tablet (50 mg total) by mouth at bedtime.       Patient Stressors: Health problems Medication change or noncompliance  Patient Strengths: Supportive family/friends  Treatment Modalities: Medication Management, Group therapy, Case management,  1 to 1 session with clinician, Psychoeducation, Recreational therapy.   Physician Treatment Plan for Primary Diagnosis: <principal problem not specified> Long Term Goal(s):     Short Term Goals:    Medication Management: Evaluate patient's response, side effects, and tolerance of medication regimen.  Therapeutic Interventions: 1 to 1 sessions, Unit Group sessions and Medication administration.  Evaluation of Outcomes: Not Met  Physician Treatment  Plan for Secondary Diagnosis: Active Problems:   Schizophrenia ([place])  Long Term Goal(s):     Short Term Goals:       Medication Management: Evaluate patient's response, side effects, and tolerance of medication regimen.  Therapeutic Interventions: 1 to 1 sessions, Unit Group sessions and Medication administration.  Evaluation of Outcomes: Not Met   RN Treatment Plan for Primary Diagnosis: <principal problem not specified> Long Term Goal(s): Knowledge of disease and therapeutic regimen to maintain health will improve  Short Term Goals: Ability to remain free from injury will improve, Ability to verbalize frustration and anger appropriately will improve, Ability to demonstrate self-control, Ability to participate in  decision making will improve, Ability to verbalize feelings will improve, Ability to disclose and discuss suicidal ideas, Ability to identify and develop effective coping behaviors will improve and Compliance with prescribed medications will improve  Medication Management: RN will administer medications as ordered by provider, will assess and evaluate patient's response and provide education to patient for prescribed medication. RN will report any adverse and/or side effects to prescribing provider.  Therapeutic Interventions: 1 on 1 counseling sessions, Psychoeducation, Medication administration, Evaluate responses to treatment, Monitor vital signs and CBGs as ordered, Perform/monitor CIWA, COWS, AIMS and Fall Risk screenings as ordered, Perform wound care treatments as ordered.  Evaluation of Outcomes: Not Met   LCSW Treatment Plan for Primary Diagnosis: <principal problem not specified> Long Term Goal(s): Safe transition to appropriate next level of care at discharge, Engage patient in therapeutic group addressing interpersonal concerns.  Short Term Goals: Engage patient in aftercare planning with referrals and resources, Increase social support, Increase ability to appropriately verbalize feelings, Increase emotional regulation, Facilitate acceptance of mental health diagnosis and concerns, Identify triggers associated with mental health/substance abuse issues and Increase skills for wellness and recovery  Therapeutic Interventions: Assess for all discharge needs, 1 to 1 time with Social worker, Explore available resources and support systems, Assess for adequacy in community support network, Educate family and significant other(s) on suicide prevention, Complete Psychosocial Assessment, Interpersonal group therapy.  Evaluation of Outcomes: Not Met   Progress in Treatment: Attending groups: No. Participating in groups: No. Taking medication as prescribed: No. Toleration medication:  No. Family/Significant other contact made: Yes, individual(s) contacted:  sister Patient understands diagnosis: No. Discussing patient identified problems/goals with staff: No. Medical problems stabilized or resolved: No. Denies suicidal/homicidal ideation: Yes. Issues/concerns per patient self-inventory: No.  New problem(s) identified: Yes, Describe:  patient continues to decline nutritional supplements   New Short Term/Long Term Goal(s): medication stabilization, elimination of SI thoughts, development of comprehensive mental wellness plan.   Patient Goals:  Did not attend  Discharge Plan or Barriers:   Reason for Continuation of Hospitalization: Anxiety Delusions  Depression Hallucinations Medication stabilization Suicidal ideation  Estimated Length of Stay: 3-5 days  Attendees: Patient: 03/21/2020   Physician:  03/21/2020   Nursing:  03/21/2020   RN Care Manager: 03/21/2020   Social Worker: Darletta Moll, LCSW 03/21/2020   Recreational Therapist:  03/21/2020   Other:  03/21/2020   Other:  03/21/2020   Other: 03/21/2020       Scribe for Treatment Team: Vassie Moselle, LCSW 03/21/2020 11:19 AM

## 2020-03-21 NOTE — Progress Notes (Signed)
Pt has been observed has been calm and cooperative with care. Pt took her PO medication without difficulties, drunk two cups of Gatorade. Pt took a shower this morning and changed her clothing. Pt reports feeling better, but still refusing to eat. Will continue to monitor.

## 2020-03-21 NOTE — ED Notes (Signed)
Date and time results received: 03/21/20 1956 (use smartphrase ".now" to insert current time)  Test: Potassium Critical Value: 2.4  Name of Provider Notified: [place] PA  Orders Received? Or Actions Taken?:

## 2020-03-22 LAB — BASIC METABOLIC PANEL
Anion gap: 13 (ref 5–15)
BUN: <5 mg/dL — ABNORMAL LOW (ref 6–20)
CO2: 27 mmol/L (ref 22–32)
Calcium: 8 mg/dL — ABNORMAL LOW (ref 8.9–10.3)
Chloride: 101 mmol/L (ref 98–111)
Creatinine, Ser: 0.58 mg/dL (ref 0.44–1.00)
GFR calc Af Amer: >60 mL/min (ref >60)
GFR calc non Af Amer: >60 mL/min (ref >60)
Glucose, Bld: 83 mg/dL (ref 70–99)
Potassium: 3 mmol/L — ABNORMAL LOW (ref 3.5–5.1)
Sodium: 141 mmol/L (ref 135–145)

## 2020-03-22 LAB — TROPONIN I (HIGH SENSITIVITY): Troponin I (High Sensitivity): 4 ng/L (ref <18)

## 2020-03-22 MED ORDER — POTASSIUM CHLORIDE 10 MEQ/100ML IV SOLN
10.0000 meq | INTRAVENOUS | Status: AC
  Administered 2020-03-22 (×2): 10 meq via INTRAVENOUS
  Filled 2020-03-22 (×2): qty 100

## 2020-03-22 MED ORDER — SODIUM CHLORIDE 0.9 % IV BOLUS
1000.0000 mL | Freq: Once | INTRAVENOUS | Status: AC
  Administered 2020-03-22: 1000 mL via INTRAVENOUS

## 2020-03-22 NOTE — ED Provider Notes (Addendum)
Emergency Medicine Observation Re-evaluation Note  [name] is a 59 y.o. female, seen on rounds today.  Pt initially presented to the ED for complaints of Paranoid Currently, the patient is awaiting re eval in am by psych.  Physical Exam  BP (!) 102/58   Pulse 69   Temp 97.9 F (36.6 C) (Oral)   Resp 15   Wt 48.5 kg   SpO2 100%   BMI 18.37 kg/m  Physical Exam General: well appearing in no distress Cardiac: well perfused Lungs: even and unlabored Psych: calm and cooperative  ED Course / MDM  EKG:    I have reviewed the labs performed to date as well as medications administered while in observation.   Sent from in pt psych for concern for dehydration, patient not eating/drinking. Labs showed hypokalemia. Given some oral replacement. 5am BMP showed mild hypokalemia at 3.0. She was given IV K. She was provided ensure and applesauce this morning which she consumed.     Plan  Current plan is for psych eval in am. dispo per psych.  Patient is medically cleared to go back to psych. Patient is not under full IVC at this time.     Milagros Loll, MD 03/22/20 8889    Milagros Loll, MD 03/22/20 1694    Milagros Loll, MD 03/22/20 1540

## 2020-03-22 NOTE — Progress Notes (Signed)
SPIRITUALITY GROUP NOTE  Pt attended spirituality group facilitated by Wilkie Aye, MDIv, BCC.  Group Description:  Group focused on topic of hope.  Patients participated in facilitated discussion around topic, connecting with one another around experiences and definitions for hope.  Group members engaged with visual explorer photos, reflecting on what hope looks like for them today.  Group engaged in discussion around how their definitions of hope are present today in hospital.   Modalities: Psycho-social ed, Adlerian, Narrative, MI Patient Progress: [name] was present through group with exception of leaving to speak with provider.  [name] engaged when facilitator prompted - noting that she did not know what hope was for her today.

## 2020-03-22 NOTE — ED Notes (Signed)
Sitter at bedside.

## 2020-03-22 NOTE — ED Notes (Signed)
Report to [facility]

## 2020-03-22 NOTE — Progress Notes (Signed)
   03/22/20 2216  Psych Admission Type (Psych Patients Only)  Admission Status Involuntary  Psychosocial Assessment  Patient Complaints Depression  Eye Contact Brief  Facial Expression Angry;Anxious;Pensive;Worried  Affect Anxious;Apathetic;Irritable;Preoccupied  Speech Logical/coherent;Soft  Interaction Avoidant;Cautious;Forwards little;Guarded;Minimal  Motor Activity Slow  Appearance/Hygiene Poor hygiene;Body odor  Behavior Characteristics Unwilling to participate  Mood Depressed  Thought Process  Coherency Concrete thinking  Content Blaming others  Delusions Paranoid  Perception Derealization  Hallucination None reported or observed  Judgment Poor  Confusion Moderate  Danger to Self  Current suicidal ideation? Denies  Danger to Others  Danger to Others None reported or observed  D: Patient in room resting. Pt isolates and is unwilling to participates in milieu. Pt denied meal but asked for Gatorade.  A: Medications administered as prescribed. Support and encouragement provided as needed.  R: Patient remains safe on the unit. Will continue to monitor for safety and stability.

## 2020-03-22 NOTE — ED Notes (Signed)
Pt given boost, finished entire container without issue.

## 2020-03-22 NOTE — ED Notes (Signed)
Pt ate approx 75% on breakfast

## 2020-03-22 NOTE — Progress Notes (Signed)
Report called from [facility] for transfer back to [facility]. However, Clinical research associate informed that due to patient's refusal to accept po meds and K+ level of 3.0,  pt would need to be reassessed this morning before coming back to [facility]. Writer was informed another bag of K+ will be given this AM. Doristine Johns

## 2020-03-22 NOTE — ED Notes (Signed)
Pt given breakfast tray, eating at this time.

## 2020-03-22 NOTE — Progress Notes (Signed)
[facility] MD Progress Note  03/22/2020 2:59 PM [name]  MRN:  [id number] Subjective:  Patient is a 59 year old female with a past psychiatric history significant for schizophrenia who was admitted on 03/02/2020 secondary to worsening psychotic symptoms and noncompliance with her schizophrenic medications.  [name] was sent to the emergency department last night for IVF due to refusing most PO intake. IV and PO potassium were administered as well due to potassium of 3.0. She did eat breakfast this morning. Patient reports that she was able to eat breakfast because the food was not "sketchy like it usually is." When asked to elaborate, she states the foods served here are not her preferred foods. She is drinking Gatorade during assessment. She was started on Marinol yesterday to stimulate her appetite. She reports adequate sleep overnight in the ED. She denies SI/HI. She does continue to express paranoid and delusional thought content on assessment- that her family is not truly her family but are strangers trying to commit her to the hospital "for the hell of it." She is calm and cooperative on the unit.   Principal Problem: <principal problem not specified> Diagnosis: Active Problems:   Schizophrenia ([facility])  Total Time spent with patient: 15 minutes  Past Psychiatric History: See admission H&P  Past Medical History:  Past Medical History:  Diagnosis Date  . Non compliance w medication regimen   . Schizophrenia ([facility])    History reviewed. No pertinent surgical history. Family History: History reviewed. No pertinent family history. Family Psychiatric  History: See admission H&P Social History:  Social History   Substance and Sexual Activity  Alcohol Use Yes   Comment: Refuses to disclose how much     Social History   Substance and Sexual Activity  Drug Use No   Comment: Refuses to answer    Social History   Socioeconomic History  . Marital status: Single    Spouse name: Not on file   . Number of children: Not on file  . Years of education: Not on file  . Highest education level: Not on file  Occupational History  . Not on file  Tobacco Use  . Smoking status: Current Some Day Smoker    Types: Cigarettes  . Smokeless tobacco: Never Used  Vaping Use  . Vaping Use: Every day  Substance and Sexual Activity  . Alcohol use: Yes    Comment: Refuses to disclose how much  . Drug use: No    Comment: Refuses to answer  . Sexual activity: Not on file    Comment: refused to answer  Other Topics Concern  . Not on file  Social History Narrative  . Not on file   Social Determinants of Health   Financial Resource Strain:   . Difficulty of Paying Living Expenses: Not on file  Food Insecurity:   . Worried About Programme researcher, broadcasting/film/video in the Last Year: Not on file  . Ran Out of Food in the Last Year: Not on file  Transportation Needs:   . Lack of Transportation (Medical): Not on file  . Lack of Transportation (Non-Medical): Not on file  Physical Activity:   . Days of Exercise per Week: Not on file  . Minutes of Exercise per Session: Not on file  Stress:   . Feeling of Stress : Not on file  Social Connections:   . Frequency of Communication with Friends and Family: Not on file  . Frequency of Social Gatherings with Friends and Family: Not on file  .  Attends Religious Services: Not on file  . Active Member of Clubs or Organizations: Not on file  . Attends Banker Meetings: Not on file  . Marital Status: Not on file   Additional Social History:                         Sleep: Good  Appetite:  Fair  Current Medications: Current Facility-Administered Medications  Medication Dose Route Frequency Provider Last Rate Last Admin  . acetaminophen (TYLENOL) tablet 650 mg  650 mg Oral Q6H PRN Antonieta Pert, MD      . alum & mag hydroxide-simeth (MAALOX/MYLANTA) 200-200-20 MG/5ML suspension 30 mL  30 mL Oral Q4H PRN Antonieta Pert, MD      .  haloperidol (HALDOL) tablet 5 mg  5 mg Oral Q6H PRN Antonieta Pert, MD       And  . benztropine (COGENTIN) tablet 1 mg  1 mg Oral Q6H PRN Antonieta Pert, MD      . dronabinol (MARINOL) capsule 2.5 mg  2.5 mg Oral BID AC Antonieta Pert, MD   2.5 mg at 03/22/20 1320  . feeding supplement (BOOST / RESOURCE BREEZE) liquid 1 Container  1 Container Oral BID BM Antonieta Pert, MD   1 Container at 03/22/20 [phone]  . feeding supplement (ENSURE ENLIVE) (ENSURE ENLIVE) liquid 237 mL  237 mL Oral BID BM Antonieta Pert, MD      . hydrOXYzine (ATARAX/VISTARIL) tablet 25 mg  25 mg Oral TID PRN Antonieta Pert, MD      . Lumateperone Tosylate CAPS 42 mg  42 mg Oral Daily Antonieta Pert, MD   42 mg at 03/21/20 0733  . magnesium hydroxide (MILK OF MAGNESIA) suspension 30 mL  30 mL Oral Daily PRN Antonieta Pert, MD      . megestrol (MEGACE) tablet 40 mg  40 mg Oral QID Antonieta Pert, MD   40 mg at 03/22/20 1320  . mirtazapine (REMERON) tablet 30 mg  30 mg Oral QHS Antonieta Pert, MD   30 mg at 03/20/20 2035  . multivitamin with minerals tablet 1 tablet  1 tablet Oral Daily Antonieta Pert, MD   1 tablet at 03/22/20 0844  . traZODone (DESYREL) tablet 50 mg  50 mg Oral QHS PRN Antonieta Pert, MD        Lab Results:  Results for orders placed or performed during the hospital encounter of 03/15/20 (from the past 48 hour(s))  Glucose, capillary     Status: Abnormal   Collection Time: 03/21/20  5:56 AM  Result Value Ref Range   Glucose-Capillary 149 (H) 70 - 99 mg/dL    Comment: Glucose reference range applies only to samples taken after fasting for at least 8 hours.  Basic metabolic panel     Status: Abnormal   Collection Time: 03/21/20  5:19 PM  Result Value Ref Range   Sodium 138 135 - 145 mmol/L   Potassium 2.4 (LL) 3.5 - 5.1 mmol/L    Comment: CRITICAL RESULT CALLED TO, READ BACK BY AND VERIFIED WITH: HODGES,I. RN  ON 09.02.2021 BY COHEN,K    Chloride 95  (L) 98 - 111 mmol/L   CO2 31 22 - 32 mmol/L   Glucose, Bld 96 70 - 99 mg/dL    Comment: Glucose reference range applies only to samples taken after fasting for at least 8 hours.   BUN <5 (L)  6 - 20 mg/dL   Creatinine, Ser 5.620.69 0.44 - 1.00 mg/dL   Calcium 9.2 8.9 - 13.010.3 mg/dL   GFR calc non Af Amer >60 >60 mL/min   GFR calc Af Amer >60 >60 mL/min   Anion gap 12 5 - 15    Comment: Performed at [facility], [address] [place], [place] [address]  CBC     Status: Abnormal   Collection Time: 03/21/20  5:19 PM  Result Value Ref Range   WBC 6.3 4.0 - 10.5 K/uL   RBC 5.17 (H) 3.87 - 5.11 MIL/uL   Hemoglobin 14.6 12.0 - 15.0 g/dL   HCT 46.943.7 36 - 46 %   MCV 84.5 80.0 - 100.0 fL   MCH 28.2 26.0 - 34.0 pg   MCHC 33.4 30.0 - 36.0 g/dL   RDW 62.912.1 52.811.5 - 41.315.5 %   Platelets 213 150 - 400 K/uL   nRBC 0.00 0.0 - 0.2 %    Comment: Performed at [facility], [address] [place], [place] [address]  Troponin I (High Sensitivity)     Status: None   Collection Time: 03/21/20  5:19 PM  Result Value Ref Range   Troponin I (High Sensitivity) 5 <18 ng/L    Comment: (NOTE) Elevated high sensitivity troponin I (hsTnI) values and significant  changes across serial measurements may suggest ACS but many other  chronic and acute conditions are known to elevate hsTnI results.  Refer to the Links section for chest pain algorithms and additional  guidance. Performed at [facility], [address] [place], [place] [address]   I-Stat beta hCG blood, ED     Status: None   Collection Time: 03/21/20  5:55 PM  Result Value Ref Range   I-stat hCG, quantitative <5.0 <5 mIU/mL   Comment 3            Comment:   GEST. AGE      CONC.  (mIU/mL)   <=1 WEEK        5 - 50     2 WEEKS       50 - 500     3 WEEKS       100 - 10,000     4 WEEKS     1,000 - 30,000        FEMALE AND NON-PREGNANT FEMALE:     LESS THAN 5 mIU/mL   Troponin I (High Sensitivity)      Status: None   Collection Time: 03/21/20 11:50 PM  Result Value Ref Range   Troponin I (High Sensitivity) 4 <18 ng/L    Comment: (NOTE) Elevated high sensitivity troponin I (hsTnI) values and significant  changes across serial measurements may suggest ACS but many other  chronic and acute conditions are known to elevate hsTnI results.  Refer to the "Links" section for chest pain algorithms and additional  guidance. Performed at [facility], [address] [place], [place] [address]   Basic metabolic panel     Status: Abnormal   Collection Time: 03/22/20  4:22 AM  Result Value Ref Range   Sodium 141 135 - 145 mmol/L   Potassium 3.0 (L) 3.5 - 5.1 mmol/L    Comment: DELTA CHECK NOTED   Chloride 101 98 - 111 mmol/L   CO2 27 22 - 32 mmol/L   Glucose, Bld 83 70 - 99 mg/dL    Comment: Glucose reference range applies only to samples taken  after fasting for at least 8 hours.   BUN <5 (L) 6 - 20 mg/dL   Creatinine, Ser 2.99 0.44 - 1.00 mg/dL   Calcium 8.0 (L) 8.9 - 10.3 mg/dL   GFR calc non Af Amer >60 >60 mL/min   GFR calc Af Amer >60 >60 mL/min   Anion gap 13 5 - 15    Comment: Performed at [facility], [address] [place], [place] [address]    Blood Alcohol level:  Lab Results  Component Value Date   ETH <10 02/29/2020   ETH <10 10/04/2019    Metabolic Disorder Labs: Lab Results  Component Value Date   HGBA1C 5.7 (H) 03/14/2020   MPG 116.89 03/14/2020   MPG 128 04/16/2015   Lab Results  Component Value Date   PROLACTIN 13.6 04/16/2015   Lab Results  Component Value Date   CHOL 186 04/16/2015   TRIG 64 04/16/2015   HDL 55 04/16/2015   CHOLHDL 3.4 04/16/2015   VLDL 13 04/16/2015   LDLCALC 118 (H) 04/16/2015    Physical Findings: AIMS: Facial and Oral Movements Muscles of Facial Expression: None, normal Lips and Perioral Area: None, normal Jaw: None, normal Tongue: None, normal,Extremity Movements Upper (arms,  wrists, hands, fingers): None, normal Lower (legs, knees, ankles, toes): None, normal, Trunk Movements Neck, shoulders, hips: None, normal, Overall Severity Severity of abnormal movements (highest score from questions above): None, normal Incapacitation due to abnormal movements: None, normal Patient's awareness of abnormal movements (rate only patient's report): No Awareness, Dental Status Current problems with teeth and/or dentures?: No Does patient usually wear dentures?: No  CIWA:    COWS:     Musculoskeletal: Strength & Muscle Tone: within normal limits Gait & Station: normal Patient leans: [name]  Psychiatric Specialty Exam: Physical Exam Vitals and nursing note reviewed.  Constitutional:      Appearance: She is well-developed.  Cardiovascular:     Rate and Rhythm: Normal rate.  Pulmonary:     Effort: Pulmonary effort is normal.  Neurological:     Mental Status: She is alert and oriented to person, place, and time.     Review of Systems  Constitutional: Negative.   Respiratory: Negative for cough and shortness of breath.   Psychiatric/Behavioral: Negative for agitation, behavioral problems, dysphoric mood, hallucinations, self-injury, sleep disturbance and suicidal ideas. The patient is not nervous/anxious and is not hyperactive.     Blood pressure 104/60, pulse 65, temperature 98.2 F (36.8 C), temperature source Oral, resp. rate 16, weight 48.5 kg, SpO2 100 %.Body mass index is 18.37 kg/m.  General Appearance: Fairly Groomed  Eye Contact:  Fair  Speech:  Normal Rate  Volume:  Decreased  Mood:  Euthymic  Affect:  Blunt  Thought Process:  Coherent  Orientation:  Full (Time, Place, and Person)  Thought Content:  Delusions and Paranoid Ideation  Suicidal Thoughts:  No  Homicidal Thoughts:  No  Memory:  Immediate;   Fair Recent;   Fair  Judgement:  Fair  Insight:  Lacking  Psychomotor Activity:  Normal  Concentration:  Concentration: Fair and Attention Span: Fair   Recall:  [employer] of Knowledge:  Fair  Language:  Fair  Akathisia:  No  Handed:  Right  AIMS (if indicated):     Assets:  Manufacturing systems engineer Housing Leisure Time Social Support  ADL's:  Intact  Cognition:  WNL  Sleep:  Number of Hours: 8.75     Treatment Plan Summary: Daily contact with patient to  assess and evaluate symptoms and progress in treatment and Medication management   Continue inpatient hospitalization.  Continue Caplyta 42 mg PO daily for psychosis Continue Marinol 2.5 mg PO BID for appetite stimulation Continue Megace 40 mg PO QID for appetite stimulation Continue Remeron 30 mg PO QHS for mood Continue Vistaril 25 mg PO TID PRN anxiety Continue trazodone 50 mg PO QHS PRN insomnia Continue Haldol 5 mg PO Q6HR PRN agitation Continue Cogentin 1 mg PO Q6HR PRN EPS  Patient will participate in the therapeutic group milieu.  Discharge disposition in progress.   Aldean Baker, NP 03/22/2020, 2:59 PM

## 2020-03-22 NOTE — ED Notes (Signed)
Pt given apple sauce, finished without issue.

## 2020-03-22 NOTE — ED Notes (Signed)
Sitter at bedside at shift change, had to leave [facility] approved), waiting for replacement.

## 2020-03-22 NOTE — Progress Notes (Signed)
Dar Note: Patient returned from [place].  Patient ambulatory without difficulty.  Affect is flat with depressed mood.  Patient able to take oral medication.  Refused food but took Gatorade 1200 ml.  Patient attended group and participated.  Routine safety checks maintained.  Patient is safe on the unit.

## 2020-03-23 LAB — GLUCOSE, CAPILLARY: Glucose-Capillary: 92 mg/dL (ref 70–99)

## 2020-03-23 NOTE — Progress Notes (Signed)
Pt remains flat, depressed and guarded- isolative to room, but attends all scheduled groups. Pt refuses food, Ensures,  but drinks Gatorade when offered. Patient compliant with PO medications. VS WNL. Pt currently denies SI/HI and A/VH, but continues to be somewhat delusional, stating that she is here due to "forced entrapment".  Patient remains safe on the unit with Q 15 min checks.

## 2020-03-23 NOTE — [facility] Group Notes (Signed)
Adult Psychoeducational Group Note  Date:  03/23/2020 Time:  11:12 AM  Group Topic/Focus:  Goals Group:   The focus of this group is to help patients establish daily goals to achieve during treatment and discuss how the patient can incorporate goal setting into their daily lives to aide in recovery.  Participation Level:  Minimal  Participation Quality:  Resistant  Affect:  Flat  Cognitive:  Delusional  Insight: Limited  Engagement in Group:  Lacking  Modes of Intervention:  Discussion, Education, Exploration and Support  Additional Comments:  Pt rates her energy as a 7/10 States she is here because of "forced entrapment"  Her goal is to attend the groups today  Vira Blanco A 03/23/2020, 11:12 AM

## 2020-03-23 NOTE — Progress Notes (Signed)
[facility] NOVEL CORONAVIRUS (COVID-19) DAILY CHECK-OFF SYMPTOMS - answer yes or no to each - every day NO YES  Have you had a fever in the past 24 hours?  . Fever (Temp > 37.80C / 100F) X   Have you had any of these symptoms in the past 24 hours? . New Cough .  Sore Throat  .  Shortness of Breath .  Difficulty Breathing .  Unexplained Body Aches   X   Have you had any one of these symptoms in the past 24 hours not related to allergies?   . Runny Nose .  Nasal Congestion .  Sneezing   X   If you have had runny nose, nasal congestion, sneezing in the past 24 hours, has it worsened?  X   EXPOSURES - check yes or no X   Have you traveled outside the state in the past 14 days?  X   Have you been in contact with someone with a confirmed diagnosis of COVID-19 or PUI in the past 14 days without wearing appropriate PPE?  X   Have you been living in the same home as a person with confirmed diagnosis of COVID-19 or a PUI (household contact)?    X   Have you been diagnosed with COVID-19?    X              What to do next: Answered NO to all: Answered YES to anything:   Proceed with unit schedule Follow the [facility] Inpatient Flowsheet.   

## 2020-03-23 NOTE — Progress Notes (Signed)
[facility] MD Progress Note  03/23/2020 2:46 PM [name]  MRN:  [id number] Subjective: Patient is a 59 year old female with a past psychiatric history significant for schizophrenia who was admitted on 03/02/2020 secondary to worsening psychotic symptoms and noncompliance with her schizophrenic medications.  On assessment today, patient initially reports good mood. She is found lying in bed. She reports good sleep overnight. She initially is calm but becomes agitated when discussing PO intake. She refused dinner last night and breakfast this morning. I reinforced that we need her to eat regularly in order to discharge her from the hospital. She became agitated, saying she would not eat the food here but will eat when she gets home. She refuses to discuss further. She has been refusing Ensures as well but will drink Gatorade. Blood pressure 94/64. Heart rate 72. She continues to express delusion that the people who commit her are not her real family. She has been participating in groups with no disruptive behaviors on the unit. She denies SI/HI/AVH. Marinol was ordered two days but per [facility] only started yesterday due to ED visit for IVF.  Principal Problem: <principal problem not specified> Diagnosis: Active Problems:   Schizophrenia (HCC)  Total Time spent with patient: 15 minutes  Past Psychiatric History: See admission H&P  Past Medical History:  Past Medical History:  Diagnosis Date  . Non compliance w medication regimen   . Schizophrenia (HCC)    History reviewed. No pertinent surgical history. Family History: History reviewed. No pertinent family history. Family Psychiatric  History: See admission H&P Social History:  Social History   Substance and Sexual Activity  Alcohol Use Yes   Comment: Refuses to disclose how much     Social History   Substance and Sexual Activity  Drug Use No   Comment: Refuses to answer    Social History   Socioeconomic History  . Marital status: Single     Spouse name: Not on file  . Number of children: Not on file  . Years of education: Not on file  . Highest education level: Not on file  Occupational History  . Not on file  Tobacco Use  . Smoking status: Current Some Day Smoker    Types: Cigarettes  . Smokeless tobacco: Never Used  Vaping Use  . Vaping Use: Every day  Substance and Sexual Activity  . Alcohol use: Yes    Comment: Refuses to disclose how much  . Drug use: No    Comment: Refuses to answer  . Sexual activity: Not on file    Comment: refused to answer  Other Topics Concern  . Not on file  Social History Narrative  . Not on file   Social Determinants of Health   Financial Resource Strain:   . Difficulty of Paying Living Expenses: Not on file  Food Insecurity:   . Worried About Programme researcher, broadcasting/film/video in the Last Year: Not on file  . Ran Out of Food in the Last Year: Not on file  Transportation Needs:   . Lack of Transportation (Medical): Not on file  . Lack of Transportation (Non-Medical): Not on file  Physical Activity:   . Days of Exercise per Week: Not on file  . Minutes of Exercise per Session: Not on file  Stress:   . Feeling of Stress : Not on file  Social Connections:   . Frequency of Communication with Friends and Family: Not on file  . Frequency of Social Gatherings with Friends and Family: Not on  file  . Attends Religious Services: Not on file  . Active Member of Clubs or Organizations: Not on file  . Attends Banker Meetings: Not on file  . Marital Status: Not on file   Additional Social History:                         Sleep: Good  Appetite:  Poor  Current Medications: Current Facility-Administered Medications  Medication Dose Route Frequency Provider Last Rate Last Admin  . acetaminophen (TYLENOL) tablet 650 mg  650 mg Oral Q6H PRN Antonieta Pert, MD      . alum & mag hydroxide-simeth (MAALOX/MYLANTA) 200-200-20 MG/5ML suspension 30 mL  30 mL Oral Q4H PRN Antonieta Pert, MD      . haloperidol (HALDOL) tablet 5 mg  5 mg Oral Q6H PRN Antonieta Pert, MD       And  . benztropine (COGENTIN) tablet 1 mg  1 mg Oral Q6H PRN Antonieta Pert, MD      . dronabinol (MARINOL) capsule 2.5 mg  2.5 mg Oral BID AC Antonieta Pert, MD   2.5 mg at 03/23/20 1206  . feeding supplement (BOOST / RESOURCE BREEZE) liquid 1 Container  1 Container Oral BID BM Antonieta Pert, MD   1 Container at 03/22/20 [phone]  . feeding supplement (ENSURE ENLIVE) (ENSURE ENLIVE) liquid 237 mL  237 mL Oral BID BM Antonieta Pert, MD      . hydrOXYzine (ATARAX/VISTARIL) tablet 25 mg  25 mg Oral TID PRN Antonieta Pert, MD      . Lumateperone Tosylate CAPS 42 mg  42 mg Oral Daily Antonieta Pert, MD   42 mg at 03/23/20 0826  . magnesium hydroxide (MILK OF MAGNESIA) suspension 30 mL  30 mL Oral Daily PRN Antonieta Pert, MD      . megestrol (MEGACE) tablet 40 mg  40 mg Oral QID Antonieta Pert, MD   40 mg at 03/23/20 1206  . mirtazapine (REMERON) tablet 30 mg  30 mg Oral QHS Antonieta Pert, MD   30 mg at 03/22/20 2134  . multivitamin with minerals tablet 1 tablet  1 tablet Oral Daily Antonieta Pert, MD   1 tablet at 03/23/20 0827  . traZODone (DESYREL) tablet 50 mg  50 mg Oral QHS PRN Antonieta Pert, MD        Lab Results:  Results for orders placed or performed during the hospital encounter of 03/15/20 (from the past 48 hour(s))  Basic metabolic panel     Status: Abnormal   Collection Time: 03/21/20  5:19 PM  Result Value Ref Range   Sodium 138 135 - 145 mmol/L   Potassium 2.4 (LL) 3.5 - 5.1 mmol/L    Comment: CRITICAL RESULT CALLED TO, READ BACK BY AND VERIFIED WITH: HODGES,I. RN  ON 09.02.2021 BY COHEN,K    Chloride 95 (L) 98 - 111 mmol/L   CO2 31 22 - 32 mmol/L   Glucose, Bld 96 70 - 99 mg/dL    Comment: Glucose reference range applies only to samples taken after fasting for at least 8 hours.   BUN <5 (L) 6 - 20 mg/dL   Creatinine, Ser  9.60 0.44 - 1.00 mg/dL   Calcium 9.2 8.9 - 45.4 mg/dL   GFR calc non Af Amer >60 >60 mL/min   GFR calc Af Amer >60 >60 mL/min   Anion gap 12 5 -  15    Comment: Performed at [facility], [address] [place], [place] [address]  CBC     Status: Abnormal   Collection Time: 03/21/20  5:19 PM  Result Value Ref Range   WBC 6.3 4.0 - 10.5 K/uL   RBC 5.17 (H) 3.87 - 5.11 MIL/uL   Hemoglobin 14.6 12.0 - 15.0 g/dL   HCT 91.443.7 36 - 46 %   MCV 84.5 80.0 - 100.0 fL   MCH 28.2 26.0 - 34.0 pg   MCHC 33.4 30.0 - 36.0 g/dL   RDW 78.212.1 95.611.5 - 21.315.5 %   Platelets 213 150 - 400 K/uL   nRBC 0.00 0.0 - 0.2 %    Comment: Performed at [facility], [address] [place], [place] [address]  Troponin I (High Sensitivity)     Status: None   Collection Time: 03/21/20  5:19 PM  Result Value Ref Range   Troponin I (High Sensitivity) 5 <18 ng/L    Comment: (NOTE) Elevated high sensitivity troponin I (hsTnI) values and significant  changes across serial measurements may suggest ACS but many other  chronic and acute conditions are known to elevate hsTnI results.  Refer to the Links section for chest pain algorithms and additional  guidance. Performed at [facility], [address] [place], [place] [address]   I-Stat beta hCG blood, ED     Status: None   Collection Time: 03/21/20  5:55 PM  Result Value Ref Range   I-stat hCG, quantitative <5.0 <5 mIU/mL   Comment 3            Comment:   GEST. AGE      CONC.  (mIU/mL)   <=1 WEEK        5 - 50     2 WEEKS       50 - 500     3 WEEKS       100 - 10,000     4 WEEKS     1,000 - 30,000        FEMALE AND NON-PREGNANT FEMALE:     LESS THAN 5 mIU/mL   Troponin I (High Sensitivity)     Status: None   Collection Time: 03/21/20 11:50 PM  Result Value Ref Range   Troponin I (High Sensitivity) 4 <18 ng/L    Comment: (NOTE) Elevated high sensitivity troponin I (hsTnI) values and significant  changes  across serial measurements may suggest ACS but many other  chronic and acute conditions are known to elevate hsTnI results.  Refer to the "Links" section for chest pain algorithms and additional  guidance. Performed at [facility], [address] [place], [place] [address]   Basic metabolic panel     Status: Abnormal   Collection Time: 03/22/20  4:22 AM  Result Value Ref Range   Sodium 141 135 - 145 mmol/L   Potassium 3.0 (L) 3.5 - 5.1 mmol/L    Comment: DELTA CHECK NOTED   Chloride 101 98 - 111 mmol/L   CO2 27 22 - 32 mmol/L   Glucose, Bld 83 70 - 99 mg/dL    Comment: Glucose reference range applies only to samples taken after fasting for at least 8 hours.   BUN <5 (L) 6 - 20 mg/dL   Creatinine, Ser 4.130.58 0.44 - 1.00 mg/dL   Calcium 8.0 (L) 8.9 - 10.3 mg/dL   GFR calc non Af Amer >60 >60 mL/min   GFR  calc Af Amer >60 >60 mL/min   Anion gap 13 5 - 15    Comment: Performed at [facility], [address] [place], [place] [address]  Glucose, capillary     Status: None   Collection Time: 03/23/20  5:38 AM  Result Value Ref Range   Glucose-Capillary 92 70 - 99 mg/dL    Comment: Glucose reference range applies only to samples taken after fasting for at least 8 hours.    Blood Alcohol level:  Lab Results  Component Value Date   ETH <10 02/29/2020   ETH <10 10/04/2019    Metabolic Disorder Labs: Lab Results  Component Value Date   HGBA1C 5.7 (H) 03/14/2020   MPG 116.89 03/14/2020   MPG 128 04/16/2015   Lab Results  Component Value Date   PROLACTIN 13.6 04/16/2015   Lab Results  Component Value Date   CHOL 186 04/16/2015   TRIG 64 04/16/2015   HDL 55 04/16/2015   CHOLHDL 3.4 04/16/2015   VLDL 13 04/16/2015   LDLCALC 118 (H) 04/16/2015    Physical Findings: AIMS: Facial and Oral Movements Muscles of Facial Expression: None, normal Lips and Perioral Area: None, normal Jaw: None, normal Tongue: None, normal,Extremity  Movements Upper (arms, wrists, hands, fingers): None, normal Lower (legs, knees, ankles, toes): None, normal, Trunk Movements Neck, shoulders, hips: None, normal, Overall Severity Severity of abnormal movements (highest score from questions above): None, normal Incapacitation due to abnormal movements: None, normal Patient's awareness of abnormal movements (rate only patient's report): No Awareness, Dental Status Current problems with teeth and/or dentures?: No Does patient usually wear dentures?: No  CIWA:    COWS:     Musculoskeletal: Strength & Muscle Tone: within normal limits Gait & Station: normal Patient leans: [name]  Psychiatric Specialty Exam: Physical Exam Vitals and nursing note reviewed.  Constitutional:      Appearance: She is well-developed.  Cardiovascular:     Rate and Rhythm: Normal rate.  Pulmonary:     Effort: Pulmonary effort is normal.  Neurological:     Mental Status: She is alert and oriented to person, place, and time.     Review of Systems  Constitutional: Negative.   Respiratory: Negative for cough and shortness of breath.   Psychiatric/Behavioral: Positive for agitation (when discussing PO intake). Negative for behavioral problems, confusion, decreased concentration, dysphoric mood, hallucinations, self-injury, sleep disturbance and suicidal ideas. The patient is not nervous/anxious and is not hyperactive.     Blood pressure 94/64, pulse 72, temperature 98.4 F (36.9 C), temperature source Oral, resp. rate 16, weight 51.3 kg, SpO2 100 %.Body mass index is 19.4 kg/m.  General Appearance: Fairly Groomed  Eye Contact:  Minimal  Speech:  Normal Rate  Volume:  Normal  Mood:  Irritable  Affect:  Congruent  Thought Process:  Coherent  Orientation:  Full (Time, Place, and Person)  Thought Content:  Delusions  Suicidal Thoughts:  No  Homicidal Thoughts:  No  Memory:  Immediate;   Fair Recent;   Fair  Judgement:  Poor  Insight:  Lacking  Psychomotor  Activity:  Normal  Concentration:  Concentration: Good and Attention Span: Good  Recall:  [employer] of Knowledge:  Fair  Language:  Good  Akathisia:  No  Handed:  Right  AIMS (if indicated):     Assets:  Communication Skills Desire for Improvement Housing Social Support  ADL's:  Intact  Cognition:  WNL  Sleep:  Number of Hours: 6.75  Treatment Plan Summary: Daily contact with patient to assess and evaluate symptoms and progress in treatment and Medication management   Continue inpatient hospitalization.  Patient received her second Haldol Decanoate 100 mg injection on 03/15/20 Continue Caplyta 42 mg PO daily for psychosis Continue Marinol 2.5 mg PO BID for appetite stimulation Continue Megace 40 mg PO QID for appetite stimulation Continue Remeron 30 mg PO QHS for mood Continue Vistaril 25 mg PO TID PRN anxiety Continue trazodone 50 mg PO QHS PRN insomnia Continue Haldol 5 mg PO Q6HR PRN agitation Continue Cogentin 1 mg PO Q6HR PRN EPS  Patient will participate in the therapeutic group milieu.  Discharge disposition in progress.   Aldean Baker, NP 03/23/2020, 2:46 PM

## 2020-03-23 NOTE — [facility] Group Notes (Signed)
  [facility] LCSW Group Therapy Note  Date/Time:  03/23/2020 2:15-3:00pm  Type of Therapy and Topic:  Group Therapy:  Feelings About Hospitalization  Participation Level:  None   Description of Group This process group involved patients discussing their feelings related to being hospitalized, as well as the benefits they see to being in the hospital.  These feelings and benefits were itemized.  The group then brainstormed specific ways in which they could seek those same benefits when they discharge and return home.  Therapeutic Goals 1. Patient will identify and describe positive and negative feelings related to hospitalization 2. Patient will verbalize benefits of hospitalization to themselves personally 3. Patients will brainstorm together ways they can obtain similar benefits in the outpatient setting, identify barriers to wellness and possible solutions  Summary of Patient Progress:  The patient expressed her primary feelings about being hospitalized are "it's a waste of my time."  She did not speak again in group.  When asked what she can do once she is discharged to stay well and out of the hospital, she shrugged her shoulders.  Therapeutic Modalities Cognitive Behavioral Therapy Motivational Interviewing    Ambrose Mantle, LCSW 03/23/2020, 4:34 PM

## 2020-03-24 LAB — GLUCOSE, CAPILLARY: Glucose-Capillary: 97 mg/dL (ref 70–99)

## 2020-03-24 MED ORDER — DRONABINOL 5 MG PO CAPS
5.0000 mg | ORAL_CAPSULE | Freq: Two times a day (BID) | ORAL | Status: DC
  Administered 2020-03-24 – 2020-03-25 (×3): 5 mg via ORAL
  Filled 2020-03-24 (×3): qty 2

## 2020-03-24 NOTE — Progress Notes (Signed)
Adult Psychoeducational Group Note  Date:  03/24/2020 Time:  4:07 AM  Group Topic/Focus:  Wrap-Up Group:   The focus of this group is to help patients review their daily goal of treatment and discuss progress on daily workbooks.  Participation Level:  Did Not Attend  Participation Quality:  Did Not Attend  Affect:  Did Not Attend  Cognitive:  Did Not Attend  Insight: None  Engagement in Group:  Did Not Attend  Modes of Intervention:  Did Not Attend  Additional Comments:  Pt did not attend evening wrap up group tonight.  Kamuela Magos 03/24/2020, 4:07 AM 

## 2020-03-24 NOTE — Progress Notes (Addendum)
   03/24/20 0014  Psych Admission Type (Psych Patients Only)  Admission Status Involuntary  Psychosocial Assessment  Patient Complaints Depression  Eye Contact Brief  Facial Expression Angry;Anxious;Pensive;Worried  Affect Apathetic;Preoccupied  Systems analyst;Soft  Interaction Avoidant;Cautious;Forwards little;Guarded;Minimal  Motor Activity Slow  Appearance/Hygiene Poor hygiene;Body odor  Behavior Characteristics Cooperative;Guarded  Mood Depressed  Thought Process  Coherency Concrete thinking  Content Blaming others  Delusions Paranoid  Perception Derealization  Hallucination None reported or observed  Judgment Poor  Confusion Moderate  Danger to Self  Current suicidal ideation? Denies  Danger to Others  Danger to Others None reported or observed  D: Patient continue to stay in her room and not interacting with peers. Pt is paranoid and preoccupied. Pt continue to refuse meals, will only drink Gatorade. Mood and affect is flat and depressed.  A: Medications administered as prescribed. Support and encouragement provided as needed.  R: Patient remains safe on the unit. Will continue to monitor for safety and stability.

## 2020-03-24 NOTE — [facility] Group Notes (Signed)
Adult Psychoeducational Group Not Date:  03/24/2020 Time:  1000-1045 Group Topic/Focus: PROGRESSIVE RELAXATION. A group where deep breathing is taught and tensing and relaxation muscle groups is used. Imagery is used as well.  Pts are asked to imagine 3 pillars that hold them up when they are not able to hold themselves up.  Participation Level:  Active  Participation Quality:  Appropriate  Affect:  Appropriate  Cognitive:  Oriented  Insight: Improving  Engagement in Group:  Engaged  Modes of Intervention:  Activity, Discussion, Education, and Support  Additional Comments:  Rates herself a 7/10. 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  [name] 03/24/2020

## 2020-03-24 NOTE — Progress Notes (Signed)
Psychoeducational Group Note  Date:  03/24/2020 Time:  2034  Group Topic/Focus:  Wrap-Up Group:   The focus of this group is to help patients review their daily goal of treatment and discuss progress on daily workbooks.  Participation Level: Did Not Attend  Participation Quality:  Not Applicable  Affect:  Not Applicable  Cognitive:  Not Applicable  Insight:  Not Applicable  Engagement in Group: Not Applicable  Additional Comments:  The patient did not attend group this evening.   Hazle Coca S 03/24/2020, 8:34 PM

## 2020-03-24 NOTE — Progress Notes (Signed)
[facility] MD Progress Note  03/24/2020 9:27 AM [name]  MRN:  [id number] Subjective:  Patient is a 59 year old female with a past psychiatric history significant for schizophrenia who was admitted on 03/02/2020 secondary to worsening psychotic symptoms and noncompliance with her schizophrenic medications.  [name] found sitting in her room. She is unchanged from yesterday. She has continue to refuse to eat. I reinforced she would need to eat for discharge and she remains unwilling to discuss this. She has been drinking Gatorade and per nursing report she took a Boost this morning as well. Blood pressure this morning sitting 81/60 with heart rate of 80, 68/57 with heart rate of 86 standing. I have asked nursing to recheck. She denies SI/HI/AVH. She reports good sleep with 7.25 hours recorded. No agitated or disruptive behaviors. She has been participating in some groups.  Principal Problem: <principal problem not specified> Diagnosis: Active Problems:   Schizophrenia (HCC)  Total Time spent with patient: 15 minutes  Past Psychiatric History: See admission H&P  Past Medical History:  Past Medical History:  Diagnosis Date  . Non compliance w medication regimen   . Schizophrenia (HCC)    History reviewed. No pertinent surgical history. Family History: History reviewed. No pertinent family history. Family Psychiatric  History: See admission H&P Social History:  Social History   Substance and Sexual Activity  Alcohol Use Yes   Comment: Refuses to disclose how much     Social History   Substance and Sexual Activity  Drug Use No   Comment: Refuses to answer    Social History   Socioeconomic History  . Marital status: Single    Spouse name: Not on file  . Number of children: Not on file  . Years of education: Not on file  . Highest education level: Not on file  Occupational History  . Not on file  Tobacco Use  . Smoking status: Current Some Day Smoker    Types: Cigarettes  .  Smokeless tobacco: Never Used  Vaping Use  . Vaping Use: Every day  Substance and Sexual Activity  . Alcohol use: Yes    Comment: Refuses to disclose how much  . Drug use: No    Comment: Refuses to answer  . Sexual activity: Not on file    Comment: refused to answer  Other Topics Concern  . Not on file  Social History Narrative  . Not on file   Social Determinants of Health   Financial Resource Strain:   . Difficulty of Paying Living Expenses: Not on file  Food Insecurity:   . Worried About Programme researcher, broadcasting/film/video in the Last Year: Not on file  . Ran Out of Food in the Last Year: Not on file  Transportation Needs:   . Lack of Transportation (Medical): Not on file  . Lack of Transportation (Non-Medical): Not on file  Physical Activity:   . Days of Exercise per Week: Not on file  . Minutes of Exercise per Session: Not on file  Stress:   . Feeling of Stress : Not on file  Social Connections:   . Frequency of Communication with Friends and Family: Not on file  . Frequency of Social Gatherings with Friends and Family: Not on file  . Attends Religious Services: Not on file  . Active Member of Clubs or Organizations: Not on file  . Attends Banker Meetings: Not on file  . Marital Status: Not on file   Additional Social History:  Sleep: Good  Appetite:  Poor  Current Medications: Current Facility-Administered Medications  Medication Dose Route Frequency Provider Last Rate Last Admin  . acetaminophen (TYLENOL) tablet 650 mg  650 mg Oral Q6H PRN Antonieta Pert, MD      . alum & mag hydroxide-simeth (MAALOX/MYLANTA) 200-200-20 MG/5ML suspension 30 mL  30 mL Oral Q4H PRN Antonieta Pert, MD      . haloperidol (HALDOL) tablet 5 mg  5 mg Oral Q6H PRN Antonieta Pert, MD       And  . benztropine (COGENTIN) tablet 1 mg  1 mg Oral Q6H PRN Antonieta Pert, MD      . dronabinol (MARINOL) capsule 5 mg  5 mg Oral BID AC Aldean Baker, NP      . feeding supplement (BOOST / RESOURCE BREEZE) liquid 1 Container  1 Container Oral BID BM Antonieta Pert, MD   1 Container at 03/22/20 [phone]  . feeding supplement (ENSURE ENLIVE) (ENSURE ENLIVE) liquid 237 mL  237 mL Oral BID BM Antonieta Pert, MD      . hydrOXYzine (ATARAX/VISTARIL) tablet 25 mg  25 mg Oral TID PRN Antonieta Pert, MD      . Lumateperone Tosylate CAPS 42 mg  42 mg Oral Daily Antonieta Pert, MD   42 mg at 03/24/20 0300  . magnesium hydroxide (MILK OF MAGNESIA) suspension 30 mL  30 mL Oral Daily PRN Antonieta Pert, MD      . megestrol (MEGACE) tablet 40 mg  40 mg Oral QID Antonieta Pert, MD   40 mg at 03/24/20 9233  . mirtazapine (REMERON) tablet 30 mg  30 mg Oral QHS Antonieta Pert, MD   30 mg at 03/23/20 2149  . multivitamin with minerals tablet 1 tablet  1 tablet Oral Daily Antonieta Pert, MD   1 tablet at 03/23/20 0827  . traZODone (DESYREL) tablet 50 mg  50 mg Oral QHS PRN Antonieta Pert, MD        Lab Results:  Results for orders placed or performed during the hospital encounter of 03/15/20 (from the past 48 hour(s))  Glucose, capillary     Status: None   Collection Time: 03/23/20  5:38 AM  Result Value Ref Range   Glucose-Capillary 92 70 - 99 mg/dL    Comment: Glucose reference range applies only to samples taken after fasting for at least 8 hours.  Glucose, capillary     Status: None   Collection Time: 03/24/20  6:07 AM  Result Value Ref Range   Glucose-Capillary 97 70 - 99 mg/dL    Comment: Glucose reference range applies only to samples taken after fasting for at least 8 hours.    Blood Alcohol level:  Lab Results  Component Value Date   ETH <10 02/29/2020   ETH <10 10/04/2019    Metabolic Disorder Labs: Lab Results  Component Value Date   HGBA1C 5.7 (H) 03/14/2020   MPG 116.89 03/14/2020   MPG 128 04/16/2015   Lab Results  Component Value Date   PROLACTIN 13.6 04/16/2015   Lab Results   Component Value Date   CHOL 186 04/16/2015   TRIG 64 04/16/2015   HDL 55 04/16/2015   CHOLHDL 3.4 04/16/2015   VLDL 13 04/16/2015   LDLCALC 118 (H) 04/16/2015    Physical Findings: AIMS: Facial and Oral Movements Muscles of Facial Expression: None, normal Lips and Perioral Area: None, normal Jaw: None, normal Tongue: None,  normal,Extremity Movements Upper (arms, wrists, hands, fingers): None, normal Lower (legs, knees, ankles, toes): None, normal, Trunk Movements Neck, shoulders, hips: None, normal, Overall Severity Severity of abnormal movements (highest score from questions above): None, normal Incapacitation due to abnormal movements: None, normal Patient's awareness of abnormal movements (rate only patient's report): No Awareness, Dental Status Current problems with teeth and/or dentures?: No Does patient usually wear dentures?: No  CIWA:    COWS:     Musculoskeletal: Strength & Muscle Tone: within normal limits Gait & Station: normal Patient leans: [name]  Psychiatric Specialty Exam: Physical Exam Vitals and nursing note reviewed.  Constitutional:      Appearance: She is well-developed.  Cardiovascular:     Rate and Rhythm: Normal rate.  Pulmonary:     Effort: Pulmonary effort is normal.  Neurological:     Mental Status: She is alert and oriented to person, place, and time.     Review of Systems  Constitutional: Negative.   Respiratory: Negative for cough and shortness of breath.   Psychiatric/Behavioral: Negative for agitation, behavioral problems, confusion, decreased concentration, dysphoric mood, hallucinations, self-injury, sleep disturbance and suicidal ideas. The patient is not nervous/anxious and is not hyperactive.     Blood pressure (!) 68/57, pulse 86, temperature 98.8 F (37.1 C), temperature source Oral, resp. rate 18, weight 50.3 kg, SpO2 100 %.Body mass index is 19.05 kg/m.  General Appearance: Fairly Groomed  Eye Contact:  Good  Speech:   Normal Rate  Volume:  Normal  Mood:  Irritable  Affect:  Congruent  Thought Process:  Coherent  Orientation:  Full (Time, Place, and Person)  Thought Content:  Delusions  Suicidal Thoughts:  No  Homicidal Thoughts:  No  Memory:  Immediate;   Fair Recent;   Fair  Judgement:  Poor  Insight:  Lacking  Psychomotor Activity:  Normal  Concentration:  Concentration: Fair and Attention Span: Fair  Recall:  [employer] of Knowledge:  Fair  Language:  Fair  Akathisia:  No  Handed:  Right  AIMS (if indicated):     Assets:  Communication Skills Housing Social Support  ADL's:  Intact  Cognition:  WNL  Sleep:  Number of Hours: 7.25     Treatment Plan Summary: Daily contact with patient to assess and evaluate symptoms and progress in treatment and Medication management   Continue inpatient hospitalization.  Increase Marinol to 5 mg PO BID for appetite stimulation Continue Megace 40 mg PO QID for appetite stimulation Patient received her second Haldol Decanoate 100 mg injection on 03/15/20 Continue Caplyta 42 mg PO daily for psychosis Continue Remeron 30 mg PO QHS for mood Continue Vistaril 25 mg PO TID PRN anxiety Continue trazodone 50 mg PO QHS PRN insomnia Continue Haldol 5 mg PO Q6HR PRN agitation Continue Cogentin 1 mg PO Q6HR PRN EPS  Patient will participate in the therapeutic group milieu.  Discharge disposition in progress.  Aldean Baker, NP 03/24/2020, 9:27 AM

## 2020-03-24 NOTE — Progress Notes (Signed)
[facility] NOVEL CORONAVIRUS (COVID-19) DAILY CHECK-OFF SYMPTOMS - answer yes or no to each - every day NO YES  Have you had a fever in the past 24 hours?  . Fever (Temp > 37.80C / 100F) X   Have you had any of these symptoms in the past 24 hours? . New Cough .  Sore Throat  .  Shortness of Breath .  Difficulty Breathing .  Unexplained Body Aches   X   Have you had any one of these symptoms in the past 24 hours not related to allergies?   . Runny Nose .  Nasal Congestion .  Sneezing   X   If you have had runny nose, nasal congestion, sneezing in the past 24 hours, has it worsened?  X   EXPOSURES - check yes or no X   Have you traveled outside the state in the past 14 days?  X   Have you been in contact with someone with a confirmed diagnosis of COVID-19 or PUI in the past 14 days without wearing appropriate PPE?  X   Have you been living in the same home as a person with confirmed diagnosis of COVID-19 or a PUI (household contact)?    X   Have you been diagnosed with COVID-19?    X              What to do next: Answered NO to all: Answered YES to anything:   Proceed with unit schedule Follow the [facility] Inpatient Flowsheet.   

## 2020-03-24 NOTE — Progress Notes (Signed)
   03/24/20 2153  Psych Admission Type (Psych Patients Only)  Admission Status Involuntary  Psychosocial Assessment  Patient Complaints None  Eye Contact Brief  Facial Expression Flat  Affect Flat  Speech Logical/coherent  Interaction Minimal  Appearance/Hygiene Unremarkable  Behavior Characteristics Cooperative  Thought Process  Coherency WDL  Content WDL  Delusions WDL  Perception WDL  Hallucination None reported or observed  Judgment WDL  Confusion WDL  Danger to Self  Current suicidal ideation? Denies  Danger to Others  Danger to Others None reported or observed  Not go to group tonight. Woke up to give medications and offer boost. Patient refused boost even the boost breeze. Did drink a small med cup of water. No Gatorade available to offer at this time. Should be able to get some tomorrow. Minimal interaction with undersigned. Denies any issues with Depression or SI tonight. No aggressive behaviors.Will continue to monitor and offer fluids.

## 2020-03-24 NOTE — Progress Notes (Addendum)
   03/24/20 1100  Psych Admission Type (Psych Patients Only)  Admission Status Involuntary  Psychosocial Assessment  Patient Complaints None  Eye Contact Brief  Facial Expression Sad  Affect Apathetic;Preoccupied  Speech Logical/coherent;Soft  Interaction Forwards little;Guarded;Minimal  Motor Activity Slow  Appearance/Hygiene Poor hygiene;Body odor  Behavior Characteristics Cooperative;Guarded  Mood Depressed  Thought Process  Coherency Concrete thinking  Content WDL  Delusions Paranoid  Perception Derealization  Hallucination None reported or observed  Judgment Poor  Confusion Moderate  Danger to Self  Current suicidal ideation? Denies  Danger to Others  Danger to Others None reported or observed    Pt presents with a sullen affect, depressed mood, isolating to room throughout the shift. Pt continues to refuse food and Ensure, but agrees to water and the [employer] supplement. Pt remains guarded during interactions, forwarding little information. .Per pt's self inventory, pt rated her depression, hopelessness and anxiety all 0's. Pt denies SI/HI and A/H. Pt did attend group led by RN in the am.. Pt remains safe with Q 15 min checks.

## 2020-03-24 NOTE — [facility] Group Notes (Signed)
[facility] LCSW Group Therapy Note  Date/Time:  03/24/2020  11:00AM-12:00PM  Type of Therapy and Topic:  Group Therapy:  Music and Mood  Participation Level:  Did Not Attend   Description of Group: In this process group, members listened to a variety of genres of music and identified that different types of music evoke different responses.  Patients were encouraged to identify music that was soothing for them and music that was energizing for them.  Patients discussed how this knowledge can help with wellness and recovery in various ways including managing depression and anxiety as well as encouraging healthy sleep habits.    Therapeutic Goals: Patients will explore the impact of different varieties of music on mood Patients will verbalize the thoughts they have when listening to different types of music Patients will identify music that is soothing to them as well as music that is energizing to them Patients will discuss how to use this knowledge to assist in maintaining wellness and recovery Patients will explore the use of music as a coping skill  Summary of Patient Progress:  [name]   Therapeutic Modalities: Solution Focused Brief Therapy Activity   Karas Pickerill Grossman-Orr, LCSW    

## 2020-03-25 ENCOUNTER — Observation Stay: Admit: 2020-03-25 | Payer: Medicare Other | Source: Ambulatory Visit | Admitting: Internal Medicine

## 2020-03-25 ENCOUNTER — Other Ambulatory Visit: Payer: Self-pay

## 2020-03-25 ENCOUNTER — Inpatient Hospital Stay (HOSPITAL_COMMUNITY)
Admission: EM | Admit: 2020-03-25 | Discharge: 2020-04-01 | DRG: 315 | Disposition: A | Discharge status: Other Acute Inpatient Hospital | Payer: Medicare Other | Source: Intra-hospital | Attending: Family Medicine | Admitting: Family Medicine

## 2020-03-25 ENCOUNTER — Inpatient Hospital Stay (HOSPITAL_COMMUNITY): Payer: Medicare Other

## 2020-03-25 DIAGNOSIS — Z9114 Patient's other noncompliance with medication regimen: Secondary | ICD-10-CM

## 2020-03-25 DIAGNOSIS — N179 Acute kidney failure, unspecified: Secondary | ICD-10-CM | POA: Diagnosis present

## 2020-03-25 DIAGNOSIS — I959 Hypotension, unspecified: Secondary | ICD-10-CM | POA: Diagnosis not present

## 2020-03-25 DIAGNOSIS — F17210 Nicotine dependence, cigarettes, uncomplicated: Secondary | ICD-10-CM | POA: Diagnosis present

## 2020-03-25 DIAGNOSIS — Y92009 Unspecified place in unspecified non-institutional (private) residence as the place of occurrence of the external cause: Secondary | ICD-10-CM | POA: Diagnosis not present

## 2020-03-25 DIAGNOSIS — I10 Essential (primary) hypertension: Secondary | ICD-10-CM | POA: Diagnosis present

## 2020-03-25 DIAGNOSIS — R55 Syncope and collapse: Secondary | ICD-10-CM | POA: Diagnosis not present

## 2020-03-25 DIAGNOSIS — E878 Other disorders of electrolyte and fluid balance, not elsewhere classified: Secondary | ICD-10-CM

## 2020-03-25 DIAGNOSIS — E876 Hypokalemia: Secondary | ICD-10-CM | POA: Diagnosis not present

## 2020-03-25 DIAGNOSIS — Z681 Body mass index (BMI) 19 or less, adult: Secondary | ICD-10-CM

## 2020-03-25 DIAGNOSIS — D649 Anemia, unspecified: Secondary | ICD-10-CM | POA: Diagnosis present

## 2020-03-25 DIAGNOSIS — F200 Paranoid schizophrenia: Secondary | ICD-10-CM

## 2020-03-25 DIAGNOSIS — Z20822 Contact with and (suspected) exposure to covid-19: Secondary | ICD-10-CM | POA: Diagnosis present

## 2020-03-25 DIAGNOSIS — E440 Moderate protein-calorie malnutrition: Secondary | ICD-10-CM

## 2020-03-25 DIAGNOSIS — G4700 Insomnia, unspecified: Secondary | ICD-10-CM | POA: Diagnosis present

## 2020-03-25 DIAGNOSIS — R630 Anorexia: Secondary | ICD-10-CM

## 2020-03-25 DIAGNOSIS — E43 Unspecified severe protein-calorie malnutrition: Secondary | ICD-10-CM | POA: Diagnosis present

## 2020-03-25 DIAGNOSIS — W19XXXA Unspecified fall, initial encounter: Secondary | ICD-10-CM | POA: Diagnosis not present

## 2020-03-25 LAB — COMPREHENSIVE METABOLIC PANEL
ALT: 7 U/L (ref 0–44)
AST: 16 U/L (ref 15–41)
Albumin: 2.5 g/dL — ABNORMAL LOW (ref 3.5–5.0)
Alkaline Phosphatase: 39 U/L (ref 38–126)
Anion gap: 10 (ref 5–15)
BUN: 5 mg/dL — ABNORMAL LOW (ref 6–20)
CO2: 27 mmol/L (ref 22–32)
Calcium: 6.9 mg/dL — ABNORMAL LOW (ref 8.9–10.3)
Chloride: 103 mmol/L (ref 98–111)
Creatinine, Ser: 0.71 mg/dL (ref 0.44–1.00)
GFR calc Af Amer: >60 mL/min (ref >60)
GFR calc non Af Amer: >60 mL/min (ref >60)
Glucose, Bld: 85 mg/dL (ref 70–99)
Potassium: 2.6 mmol/L — CL (ref 3.5–5.1)
Sodium: 140 mmol/L (ref 135–145)
Total Bilirubin: 0.4 mg/dL (ref 0.3–1.2)
Total Protein: 4.8 g/dL — ABNORMAL LOW (ref 6.5–8.1)

## 2020-03-25 LAB — MAGNESIUM
Magnesium: 1.6 mg/dL — ABNORMAL LOW (ref 1.7–2.4)
Magnesium: 4 mg/dL — ABNORMAL HIGH (ref 1.7–2.4)

## 2020-03-25 LAB — CBC WITH DIFFERENTIAL/PLATELET
Abs Immature Granulocytes: 0.01 10*3/uL (ref 0.00–0.07)
Basophils Absolute: 0 10*3/uL (ref 0.0–0.1)
Basophils Relative: 0 %
Eosinophils Absolute: 0.1 10*3/uL (ref 0.0–0.5)
Eosinophils Relative: 2 %
HCT: 29.7 % — ABNORMAL LOW (ref 36.0–46.0)
Hemoglobin: 9.6 g/dL — ABNORMAL LOW (ref 12.0–15.0)
Immature Granulocytes: 0 %
Lymphocytes Relative: 27 %
Lymphs Abs: 1.2 10*3/uL (ref 0.7–4.0)
MCH: 28.5 pg (ref 26.0–34.0)
MCHC: 32.3 g/dL (ref 30.0–36.0)
MCV: 88.1 fL (ref 80.0–100.0)
Monocytes Absolute: 0.4 10*3/uL (ref 0.1–1.0)
Monocytes Relative: 10 %
Neutro Abs: 2.6 10*3/uL (ref 1.7–7.7)
Neutrophils Relative %: 61 %
Platelets: 125 10*3/uL — ABNORMAL LOW (ref 150–400)
RBC: 3.37 MIL/uL — ABNORMAL LOW (ref 3.87–5.11)
RDW: 12.4 % (ref 11.5–15.5)
WBC: 4.3 10*3/uL (ref 4.0–10.5)
nRBC: 0 % (ref 0.0–0.2)

## 2020-03-25 LAB — LACTIC ACID, PLASMA: Lactic Acid, Venous: 1.6 mmol/L (ref 0.5–1.9)

## 2020-03-25 LAB — URINALYSIS, ROUTINE W REFLEX MICROSCOPIC
Bacteria, UA: NONE SEEN
Bilirubin Urine: NEGATIVE
Glucose, UA: NEGATIVE mg/dL
Ketones, ur: 5 mg/dL — AB
Leukocytes,Ua: NEGATIVE
Nitrite: NEGATIVE
Protein, ur: NEGATIVE mg/dL
Specific Gravity, Urine: 1.004 — ABNORMAL LOW (ref 1.005–1.030)
pH: 6 (ref 5.0–8.0)

## 2020-03-25 LAB — BASIC METABOLIC PANEL
Anion gap: 9 (ref 5–15)
BUN: <5 mg/dL — ABNORMAL LOW (ref 6–20)
CO2: 21 mmol/L — ABNORMAL LOW (ref 22–32)
Calcium: 5.9 mg/dL — CL (ref 8.9–10.3)
Chloride: 112 mmol/L — ABNORMAL HIGH (ref 98–111)
Creatinine, Ser: 0.49 mg/dL (ref 0.44–1.00)
GFR calc Af Amer: >60 mL/min (ref >60)
GFR calc non Af Amer: >60 mL/min (ref >60)
Glucose, Bld: 83 mg/dL (ref 70–99)
Potassium: 2.6 mmol/L — CL (ref 3.5–5.1)
Sodium: 142 mmol/L (ref 135–145)

## 2020-03-25 LAB — POC OCCULT BLOOD, ED: Fecal Occult Bld: NEGATIVE

## 2020-03-25 LAB — CBG MONITORING, ED: Glucose-Capillary: 94 mg/dL (ref 70–99)

## 2020-03-25 LAB — GLUCOSE, CAPILLARY: Glucose-Capillary: 77 mg/dL (ref 70–99)

## 2020-03-25 MED ORDER — SODIUM CHLORIDE 0.9 % IV BOLUS
1000.0000 mL | Freq: Once | INTRAVENOUS | Status: AC
  Administered 2020-03-25: 1000 mL via INTRAVENOUS

## 2020-03-25 MED ORDER — POTASSIUM CHLORIDE 10 MEQ/100ML IV SOLN
10.0000 meq | Freq: Once | INTRAVENOUS | Status: DC
  Administered 2020-03-25: 10 meq via INTRAVENOUS
  Filled 2020-03-25: qty 100

## 2020-03-25 MED ORDER — POTASSIUM CHLORIDE 10 MEQ/100ML IV SOLN
10.0000 meq | INTRAVENOUS | Status: AC
  Administered 2020-03-25: 10 meq via INTRAVENOUS
  Filled 2020-03-25 (×2): qty 100

## 2020-03-25 MED ORDER — POTASSIUM CHLORIDE CRYS ER 20 MEQ PO TBCR
20.0000 meq | EXTENDED_RELEASE_TABLET | Freq: Once | ORAL | Status: AC
  Administered 2020-03-25: 20 meq via ORAL
  Filled 2020-03-25 (×2): qty 1

## 2020-03-25 MED ORDER — MAGNESIUM SULFATE 2 GM/50ML IV SOLN: 2.0000 g | Freq: Once | INTRAVENOUS | Status: DC

## 2020-03-25 MED ORDER — POTASSIUM CHLORIDE 10 MEQ/100ML IV SOLN
INTRAVENOUS | Status: AC
  Administered 2020-03-25: 10 meq via INTRAVENOUS
  Filled 2020-03-25: qty 100

## 2020-03-25 MED ORDER — POTASSIUM CHLORIDE 10 MEQ/100ML IV SOLN
10.0000 meq | Freq: Once | INTRAVENOUS | Status: AC
  Administered 2020-03-25: 10 meq via INTRAVENOUS

## 2020-03-25 MED ORDER — POTASSIUM CHLORIDE CRYS ER 20 MEQ PO TBCR
40.0000 meq | EXTENDED_RELEASE_TABLET | Freq: Once | ORAL | Status: AC
  Administered 2020-03-25: 40 meq via ORAL
  Filled 2020-03-25: qty 2

## 2020-03-25 MED ORDER — MAGNESIUM SULFATE 2 GM/50ML IV SOLN
2.0000 g | Freq: Once | INTRAVENOUS | Status: AC
  Administered 2020-03-25: 2 g via INTRAVENOUS
  Filled 2020-03-25: qty 50

## 2020-03-25 NOTE — Progress Notes (Signed)
Recreation Therapy Notes ° °Date: 9.6.21 °Time: 1015 °Location: [address] Dayroom ° °Group Topic: Stress Management ° °Goal Area(s) Addresses:  °Patient will identify positive stress management techniques. °Patient will identify benefits of using stress management post d/c. ° °Intervention: Stress Meditation ° °Activity: Meditation.  LRT played a meditation that focused on fostering positive morning energy that helps develop good energy for the day.  Patients were to listen and follow along as meditation played to engage in the activity.   ° °Education:  Stress Management, Discharge Planning.  ° °Education Outcome: Acknowledges Education ° °Clinical Observations/Feedback: Pt did not attend group session. ° ° ° °Maudine Kluesner, LRT/CTRS ° ° ° ° ° ° ° ° °Alease Fait A °03/25/2020 11:29 AM °

## 2020-03-25 NOTE — Progress Notes (Signed)
   03/25/20 1316  Vital Signs  Temp 98.3 F (36.8 C)  Temp Source Oral  Pulse Rate 79  Pulse Rate Source Dinamap  Resp 16  BP (!) 77/53  BP Location Left Arm  BP Method Automatic  Patient Position (if appropriate) Sitting   Patient fell in the hallway and hit her head by the right orbital. Small laceration to and bruising. Pt. Stated that she felt "disoriented". Doctor notified. Patient to be sent out by ambulance. Pt. Did not lose consciousness, spoke and answered questions Pt. Alert and oriented x4.  Patient left unit @1335 .  D: Patient denies SI/HI/AVH. Patient isolated in her room. Patient' s blood pressure running low 0614 72/51 sitting and 70/51 standing; 1058@ [phone]

## 2020-03-25 NOTE — Progress Notes (Addendum)
[facility] MD Progress Note  03/25/2020 11:27 AM [name]  MRN:  [id number] Subjective:  Patient is a 59 year old female with a past psychiatric history significant for schizophrenia who was admitted on 03/02/2020 secondary to worsening psychotic symptoms and noncompliance with her schizophrenic medications.  Objective: Patient is seen and examined.  Patient is a 59 year old female with the above-stated past psychiatric history who is seen in follow-up.  Review of the electronic medical record revealed that she did eat something over the weekend, but is continued just to take Gatorade primarily.  She seems tired this morning.  She did receive IV fluids on 03/21/2020 and was returned to our facility on 03/22/2020.  Her current medications include Marinol, Megace, Caplyta, mirtazapine.  Her blood pressure this morning is 90/59.  She is afebrile.  Her pulse is 75 and respirations are 16.  Her weight this morning is 50.34 kg.  She continues to have a degree of paranoia, but looks less agitated or significantly paranoid.  It is unclear whether or not her paranoia is decreasing secondary to medications or she is just fatigued from not eating.  Her previous laboratories from 9/3 showed a potassium that was 3.0 but was apparently supplemented.  Her blood sugar this morning is 77.  Her creatinine on 9/3 was 0.58.  Liver function enzymes were normal.  Her CBC from 9/2 was essentially normal.  She did sleep 7.25 hours last night.   Principal Problem: <principal problem not specified> Diagnosis: Active Problems:   Schizophrenia (HCC)  Total Time spent with patient: 20 minutes  Past Psychiatric History: See admission H&P  Past Medical History:  Past Medical History:  Diagnosis Date  . Non compliance w medication regimen   . Schizophrenia (HCC)    History reviewed. No pertinent surgical history. Family History: History reviewed. No pertinent family history. Family Psychiatric  History: See admission H&P Social  History:  Social History   Substance and Sexual Activity  Alcohol Use Yes   Comment: Refuses to disclose how much     Social History   Substance and Sexual Activity  Drug Use No   Comment: Refuses to answer    Social History   Socioeconomic History  . Marital status: Single    Spouse name: Not on file  . Number of children: Not on file  . Years of education: Not on file  . Highest education level: Not on file  Occupational History  . Not on file  Tobacco Use  . Smoking status: Current Some Day Smoker    Types: Cigarettes  . Smokeless tobacco: Never Used  Vaping Use  . Vaping Use: Every day  Substance and Sexual Activity  . Alcohol use: Yes    Comment: Refuses to disclose how much  . Drug use: No    Comment: Refuses to answer  . Sexual activity: Not on file    Comment: refused to answer  Other Topics Concern  . Not on file  Social History Narrative  . Not on file   Social Determinants of Health   Financial Resource Strain:   . Difficulty of Paying Living Expenses: Not on file  Food Insecurity:   . Worried About Programme researcher, broadcasting/film/video in the Last Year: Not on file  . Ran Out of Food in the Last Year: Not on file  Transportation Needs:   . Lack of Transportation (Medical): Not on file  . Lack of Transportation (Non-Medical): Not on file  Physical Activity:   . Days of Exercise  per Week: Not on file  . Minutes of Exercise per Session: Not on file  Stress:   . Feeling of Stress : Not on file  Social Connections:   . Frequency of Communication with Friends and Family: Not on file  . Frequency of Social Gatherings with Friends and Family: Not on file  . Attends Religious Services: Not on file  . Active Member of Clubs or Organizations: Not on file  . Attends Banker Meetings: Not on file  . Marital Status: Not on file   Additional Social History:                         Sleep: Good  Appetite:  Poor  Current Medications: Current  Facility-Administered Medications  Medication Dose Route Frequency Provider Last Rate Last Admin  . acetaminophen (TYLENOL) tablet 650 mg  650 mg Oral Q6H PRN Antonieta Pert, MD      . alum & mag hydroxide-simeth (MAALOX/MYLANTA) 200-200-20 MG/5ML suspension 30 mL  30 mL Oral Q4H PRN Antonieta Pert, MD      . haloperidol (HALDOL) tablet 5 mg  5 mg Oral Q6H PRN Antonieta Pert, MD       And  . benztropine (COGENTIN) tablet 1 mg  1 mg Oral Q6H PRN Antonieta Pert, MD      . dronabinol (MARINOL) capsule 5 mg  5 mg Oral BID AC Aldean Baker, NP   5 mg at 03/24/20 1718  . feeding supplement (BOOST / RESOURCE BREEZE) liquid 1 Container  1 Container Oral BID BM Antonieta Pert, MD   1 Container at 03/25/20 [phone]  . feeding supplement (ENSURE ENLIVE) (ENSURE ENLIVE) liquid 237 mL  237 mL Oral BID BM Antonieta Pert, MD      . hydrOXYzine (ATARAX/VISTARIL) tablet 25 mg  25 mg Oral TID PRN Antonieta Pert, MD      . Lumateperone Tosylate CAPS 42 mg  42 mg Oral Daily Antonieta Pert, MD   42 mg at 03/25/20 2202  . magnesium hydroxide (MILK OF MAGNESIA) suspension 30 mL  30 mL Oral Daily PRN Antonieta Pert, MD      . megestrol (MEGACE) tablet 40 mg  40 mg Oral QID Antonieta Pert, MD   40 mg at 03/25/20 5427  . mirtazapine (REMERON) tablet 30 mg  30 mg Oral QHS Antonieta Pert, MD   30 mg at 03/24/20 2038  . multivitamin with minerals tablet 1 tablet  1 tablet Oral Daily Antonieta Pert, MD   1 tablet at 03/25/20 [phone]  . traZODone (DESYREL) tablet 50 mg  50 mg Oral QHS PRN Antonieta Pert, MD        Lab Results:  Results for orders placed or performed during the hospital encounter of 03/15/20 (from the past 48 hour(s))  Glucose, capillary     Status: None   Collection Time: 03/24/20  6:07 AM  Result Value Ref Range   Glucose-Capillary 97 70 - 99 mg/dL    Comment: Glucose reference range applies only to samples taken after fasting for at least 8 hours.  Glucose,  capillary     Status: None   Collection Time: 03/25/20  5:24 AM  Result Value Ref Range   Glucose-Capillary 77 70 - 99 mg/dL    Comment: Glucose reference range applies only to samples taken after fasting for at least 8 hours.    Blood Alcohol level:  Lab Results  Component Value Date   ETH <10 02/29/2020   ETH <10 10/04/2019    Metabolic Disorder Labs: Lab Results  Component Value Date   HGBA1C 5.7 (H) 03/14/2020   MPG 116.89 03/14/2020   MPG 128 04/16/2015   Lab Results  Component Value Date   PROLACTIN 13.6 04/16/2015   Lab Results  Component Value Date   CHOL 186 04/16/2015   TRIG 64 04/16/2015   HDL 55 04/16/2015   CHOLHDL 3.4 04/16/2015   VLDL 13 04/16/2015   LDLCALC 118 (H) 04/16/2015    Physical Findings: AIMS: Facial and Oral Movements Muscles of Facial Expression: None, normal Lips and Perioral Area: None, normal Jaw: None, normal Tongue: None, normal,Extremity Movements Upper (arms, wrists, hands, fingers): None, normal Lower (legs, knees, ankles, toes): None, normal, Trunk Movements Neck, shoulders, hips: None, normal, Overall Severity Severity of abnormal movements (highest score from questions above): None, normal Incapacitation due to abnormal movements: None, normal Patient's awareness of abnormal movements (rate only patient's report): No Awareness, Dental Status Current problems with teeth and/or dentures?: No Does patient usually wear dentures?: No  CIWA:    COWS:     Musculoskeletal: Strength & Muscle Tone: decreased Gait & Station: unsteady Patient leans: [name]  Psychiatric Specialty Exam: Physical Exam Vitals and nursing note reviewed.  HENT:     Head: Normocephalic and atraumatic.  Pulmonary:     Effort: Pulmonary effort is normal.  Neurological:     General: No focal deficit present.     Mental Status: She is alert and oriented to person, place, and time.     Review of Systems  Blood pressure (!) 90/59, pulse 75, temperature  98.7 F (37.1 C), temperature source Oral, resp. rate 16, weight 50.3 kg, SpO2 100 %.Body mass index is 19.05 kg/m.  General Appearance: Casual  Eye Contact:  Fair  Speech:  Normal Rate  Volume:  Decreased  Mood:  Dysphoric  Affect:  Flat  Thought Process:  Coherent and Descriptions of Associations: Circumstantial  Orientation:  Negative  Thought Content:  Delusions and Paranoid Ideation  Suicidal Thoughts:  No  Homicidal Thoughts:  No  Memory:  Immediate;   Poor Recent;   Poor Remote;   Poor  Judgement:  Impaired  Insight:  Lacking  Psychomotor Activity:  Decreased  Concentration:  Concentration: Fair and Attention Span: Fair  Recall:  [employer] of Knowledge:  Fair  Language:  Good  Akathisia:  Negative  Handed:  Right  AIMS (if indicated):     Assets:  Desire for Improvement Resilience  ADL's:  Impaired  Cognition:  WNL  Sleep:  Number of Hours: 7.25     Treatment Plan Summary: Daily contact with patient to assess and evaluate symptoms and progress in treatment, Medication management and Plan : Patient is seen and examined.  Patient is a 59 year old female with the above-stated past medical and psychiatric history who is seen in follow-up.   Diagnosis: 1.  Schizophrenia. 2.  Malnutrition. 3.  Anorexia secondary to paranoia  Pertinent findings on examination today: 1.  Continued anorexia secondary to paranoia 2.  Appears to be more significantly fatigued. 3.  Nursing notes reflect she did eat a bit over the weekend, and has continued to drink Gatorade. 4.  Hypokalemia 5.  We need to repeat her albumin when possible.  Plan: 1.  [facility] medical [facility] for forced tube feeds if necessary secondary to  anorexia. 2.  Given her sedation and lack of appetite stimulation will stop mirtazapine. 3.  Continue Marinol 5 mg p.o. twice daily for appetite stimulation. 4.  Continue Caplyta 42 mg p.o. daily, but  changed to nightly for psychosis. 5.  Continue Megace 40 mg p.o. 4 times daily for appetite stimulation. 6.  Continue trazodone 50 mg p.o. nightly as needed insomnia. 7.  Patient received a total of haloperidol decanoate 200 mg IM during the course of the hospitalization.  This is for psychosis. 8.  We will go on and supplement potassium given how low it was on Thursday, but most likely she needs repeat laboratories. 9.  Disposition planning-in progress.  Antonieta PertGreg Lawson Bandon Sherwin, MD 03/25/2020, 11:27 AM   Addendum: Patient became symptomatically hypotensive, and had a fall.  She struck the left forehead.  We will send her back to the emergency department for IV fluids, and also to CT scan her head to make sure no injury took place.  It would also be helpful that we could get a tube feed in her if at all possible.

## 2020-03-25 NOTE — ED Provider Notes (Signed)
[facility]-EMERGENCY DEPT Provider Note   CSN: [id number] Arrival date & time: 03/21/20  1533    History Chief Complaint  Patient presents with  . Paranoid  . Fall    Norma Raul Dellston is a 59 y.o. female with past medical history significant for medication noncompliance, paranoid schizophrenia who presents for evaluation of fall and hypotension.  Patient states she has been drinking oral liquids or has had decreased p.o. appetite due to her "limited diet."  She denies SI, HI, AVH.  She arrived from behavioral health under IVC for paranoid behavior.  Was apparently seen here 03/14/20 for decreased p.o. intake and was admitted for persistent hypoglycemia requiring IV dextrose.  She was discharged back to behavioral health.  Patient has had persistent low blood pressures with systolic into the 70s.  Patient states earlier today she became "disoriented and lightheaded."  This caused her to fall hitting right orbital area.  She denies any current pain.  She denies headache, lightheadedness, dizziness, facial pain, vision changes, neck pain, neck stiffness, chest pain, shortness of breath abdominal pain, diarrhea, dysuria, weakness, paresthesias, melena or bright red blood per rectum.  She denies additional aggravating relieving factors.  She does have 1 cm superficial laceration to right zygomatic however no bleeding, drainage.  [facility] 65.  She appears otherwise well. BP here in the 90's systolic.  History obtained from patient and past medical records. No interpretor was used.  HPI     Past Medical History:  Diagnosis Date  . Non compliance w medication regimen   . Schizophrenia [facility](HCC)     Patient Active Problem List   Diagnosis Date Noted  . Hypoglycemia 03/14/2020  . Paranoid schizophrenia (HCC) 03/01/2020  . Schizophrenia (HCC) 11/26/2017  . Noncompliance with diet and medication regimen   . Psychoses  (HCC)   . Hypokalemic alkalosis 11/11/2011  . Medically noncompliant 11/11/2011  . Schizophrenia, paranoid (HCC) 11/07/2011    History reviewed. No pertinent surgical history.   OB History   No obstetric history on file.     History reviewed. No pertinent family history.  Social History   Tobacco Use  . Smoking status: Current Some Day Smoker    Types: Cigarettes  . Smokeless tobacco: Never Used  Vaping Use  . Vaping Use: Every day  Substance Use Topics  . Alcohol use: Yes    Comment: Refuses to disclose how much  . Drug use: No    Comment: Refuses to answer    Home Medications Prior to Admission medications   Medication Sig Start Date End Date Taking? Authorizing Provider  benztropine (COGENTIN) 0.5 MG tablet Take 1 tablet (0.5 mg total) by mouth 2 (two) times daily as needed for tremors. 03/15/20   Burnadette PopAdhikari, Amrit, MD  haloperidol (HALDOL) 10 MG tablet Take 1 tablet (10 mg total) by mouth 3 (three) times daily. 03/15/20   Burnadette PopAdhikari, Amrit, MD  hydrOXYzine (ATARAX/VISTARIL) 25 MG tablet Take 1 tablet (25 mg total) by mouth 3 (three) times daily as needed for anxiety. 03/15/20   Burnadette PopAdhikari, Amrit, MD  megestrol (MEGACE) 40 MG tablet Take 1 tablet (40 mg total) by mouth daily. 03/15/20   Burnadette PopAdhikari, Amrit, MD  paliperidone (INVEGA) 6 MG 24 hr tablet Take 1 tablet (6 mg total) by mouth at bedtime. 03/15/20   Burnadette PopAdhikari, Amrit, MD  traZODone (DESYREL) 50 MG tablet Take 1 tablet (50 mg total) by mouth at bedtime.  03/15/20   Burnadette Pop, MD    Allergies    Aspirin and Geodon [ziprasidone hcl]  Review of Systems   Review of Systems  Constitutional: Positive for activity change and fatigue.  HENT: Negative.   Respiratory: Negative.   Cardiovascular: Negative.   Gastrointestinal: Negative.   Genitourinary: Negative.   Musculoskeletal: Negative.   Skin: Positive for wound.  Neurological: Positive for weakness (Generalized) and light-headedness.  All other systems reviewed and are  negative.  Physical Exam Updated Vital Signs BP (!) 102/59   Pulse 61   Temp 98.3 F (36.8 C) (Oral)   Resp 13   Wt 50.3 kg   SpO2 100%   BMI 19.05 kg/m   Physical Exam Vitals and nursing note reviewed.  Constitutional:      General: She is not in acute distress.    Appearance: She is well-developed. She is not ill-appearing, toxic-appearing or diaphoretic.  HENT:     Head: Normocephalic and atraumatic.     Nose: Nose normal.     Mouth/Throat:     Mouth: Mucous membranes are dry.  Eyes:     Pupils: Pupils are equal, round, and reactive to light.     Comments: No nystagmus  Neck:     Trachea: Trachea and phonation normal.     Comments: Full active and passive ROM without pain No midline or paraspinal tenderness No nuchal rigidity or meningeal signs  Cardiovascular:     Rate and Rhythm: Normal rate.     Pulses: Normal pulses.     Heart sounds: Normal heart sounds.  Pulmonary:     Effort: Pulmonary effort is normal. No respiratory distress.     Breath sounds: Normal breath sounds.  Abdominal:     General: Bowel sounds are normal. There is no distension.     Palpations: There is no mass.     Tenderness: There is no abdominal tenderness. There is no right CVA tenderness, left CVA tenderness, guarding or rebound.     Hernia: No hernia is present.  Musculoskeletal:        General: Normal range of motion.     Cervical back: Full passive range of motion without pain and normal range of motion.  Skin:    General: Skin is warm and dry.     Capillary Refill: Capillary refill takes less than 2 seconds.  Neurological:     General: No focal deficit present.     Mental Status: She is alert and oriented to person, place, and time.     Comments: Mental Status:  Alert, oriented, thought content appropriate. Speech fluent without evidence of aphasia. Able to follow 2 step commands without difficulty.  Cranial Nerves:  II:  Peripheral visual fields grossly normal, pupils equal,  round, reactive to light III,IV, VI: ptosis not present, extra-ocular motions intact bilaterally  V,VII: smile symmetric, facial light touch sensation equal VIII: hearing grossly normal bilaterally  IX,X: midline uvula rise  XI: bilateral shoulder shrug equal and strong XII: midline tongue extension  Motor:  5/5 in upper and lower extremities bilaterally including strong and equal grip strength and dorsiflexion/plantar flexion Sensory: Pinprick and light touch normal in all extremities.  Deep Tendon Reflexes: 2+ and symmetric  Cerebellar: normal finger-to-nose with bilateral upper extremities Gait: normal gait and balance CV: distal pulses palpable throughout       ED Results / Procedures / Treatments   Labs (all labs ordered are listed, but only abnormal results are displayed) Labs Reviewed  GLUCOSE,  CAPILLARY - Abnormal; Notable for the following components:      Result Value   Glucose-Capillary 106 (*)    All other components within normal limits  GLUCOSE, CAPILLARY - Abnormal; Notable for the following components:   Glucose-Capillary 149 (*)    All other components within normal limits  BASIC METABOLIC PANEL - Abnormal; Notable for the following components:   Potassium 2.4 (*)    Chloride 95 (*)    BUN <5 (*)    All other components within normal limits  CBC - Abnormal; Notable for the following components:   RBC 5.17 (*)    All other components within normal limits  BASIC METABOLIC PANEL - Abnormal; Notable for the following components:   Potassium 3.0 (*)    BUN <5 (*)    Calcium 8.0 (*)    All other components within normal limits  CBC WITH DIFFERENTIAL/PLATELET - Abnormal; Notable for the following components:   RBC 3.37 (*)    Hemoglobin 9.6 (*)    HCT 29.7 (*)    Platelets 125 (*)    All other components within normal limits  COMPREHENSIVE METABOLIC PANEL - Abnormal; Notable for the following components:   Potassium 2.6 (*)    BUN 5 (*)    Calcium 6.9 (*)      Total Protein 4.8 (*)    Albumin 2.5 (*)    All other components within normal limits  MAGNESIUM - Abnormal; Notable for the following components:   Magnesium 1.6 (*)    All other components within normal limits  URINALYSIS, ROUTINE W REFLEX MICROSCOPIC - Abnormal; Notable for the following components:   Color, Urine STRAW (*)    Specific Gravity, Urine 1.004 (*)    Hgb urine dipstick SMALL (*)    Ketones, ur 5 (*)    All other components within normal limits  CULTURE, BLOOD (ROUTINE X 2)  CULTURE, BLOOD (ROUTINE X 2)  URINE CULTURE  GLUCOSE, CAPILLARY  GLUCOSE, CAPILLARY  GLUCOSE, CAPILLARY  GLUCOSE, CAPILLARY  GLUCOSE, CAPILLARY  GLUCOSE, CAPILLARY  GLUCOSE, CAPILLARY  LACTIC ACID, PLASMA  LACTIC ACID, PLASMA  BASIC METABOLIC PANEL  I-STAT BETA HCG BLOOD, ED ([facility], [facility], [facility] ONLY)  CBG MONITORING, ED  POC OCCULT BLOOD, ED  TROPONIN I (HIGH SENSITIVITY)  TROPONIN I (HIGH SENSITIVITY)    EKG None    Radiology CT Head Wo Contrast  Result Date: 03/25/2020 CLINICAL DATA:  Fall, laceration under RIGHT eye. EXAM: CT HEAD WITHOUT CONTRAST CT MAXILLOFACIAL WITHOUT CONTRAST CT CERVICAL SPINE WITHOUT CONTRAST TECHNIQUE: Multidetector CT imaging of the head, cervical spine, and maxillofacial structures were performed using the standard protocol without intravenous contrast. Multiplanar CT image reconstructions of the cervical spine and maxillofacial structures were also generated. COMPARISON:  None. FINDINGS: CT HEAD FINDINGS Brain: Ventricles are within normal limits in size and configuration. There is no mass, hemorrhage, edema or other evidence of acute parenchymal abnormality. No extra-axial hemorrhage. Vascular: Chronic calcified atherosclerotic changes of the large vessels at the skull base. No unexpected hyperdense vessel. Skull: Normal. Negative for fracture or focal lesion. Other: None. CT MAXILLOFACIAL FINDINGS Osseous: Lower frontal bones are intact. No displaced nasal bone  fracture. Osseous structures about the orbits are intact and normally aligned bilaterally. Walls of the maxillary sinuses are intact and normally aligned. Bilateral zygomatic arches and pterygoid plates are intact. No mandible fracture or displacement is seen. Lucency/defect at the base of the next to last RIGHT mandibular tooth, most likely periapical dental abscess, less likely  acute traumatic dislodgement. Orbits: Negative. No traumatic or inflammatory finding. Sinuses: Clear. Soft tissues: Mild soft tissue edema overlying the upper RIGHT orbit/lower frontal bone. No underlying fracture. CT CERVICAL SPINE FINDINGS Alignment: Within normal limits. No evidence of acute vertebral body subluxation. Skull base and vertebrae: No fracture line or displaced fracture fragment is seen. Soft tissues and spinal canal: No prevertebral fluid or swelling. No visible canal hematoma. Disc levels:  Disc spaces are well maintained throughout. Upper chest: Negative. Other: None. IMPRESSION: 1. No acute intracranial abnormality. No intracranial mass, hemorrhage or edema. No skull fracture. 2. No facial bone fracture or displacement. Mild soft tissue edema overlying the upper RIGHT orbit/lower frontal bone. 3. Lucency/defect at the base of the next to last RIGHT mandibular tooth, most likely periapical dental abscess/carie, less likely acute traumatic dislodgement. 4. No fracture or acute subluxation within the cervical spine. Electronically Signed   By: Bary Richard M.D.   On: 03/25/2020 15:37   CT Cervical Spine Wo Contrast  Result Date: 03/25/2020 CLINICAL DATA:  Fall, laceration under RIGHT eye. EXAM: CT HEAD WITHOUT CONTRAST CT MAXILLOFACIAL WITHOUT CONTRAST CT CERVICAL SPINE WITHOUT CONTRAST TECHNIQUE: Multidetector CT imaging of the head, cervical spine, and maxillofacial structures were performed using the standard protocol without intravenous contrast. Multiplanar CT image reconstructions of the cervical spine and  maxillofacial structures were also generated. COMPARISON:  None. FINDINGS: CT HEAD FINDINGS Brain: Ventricles are within normal limits in size and configuration. There is no mass, hemorrhage, edema or other evidence of acute parenchymal abnormality. No extra-axial hemorrhage. Vascular: Chronic calcified atherosclerotic changes of the large vessels at the skull base. No unexpected hyperdense vessel. Skull: Normal. Negative for fracture or focal lesion. Other: None. CT MAXILLOFACIAL FINDINGS Osseous: Lower frontal bones are intact. No displaced nasal bone fracture. Osseous structures about the orbits are intact and normally aligned bilaterally. Walls of the maxillary sinuses are intact and normally aligned. Bilateral zygomatic arches and pterygoid plates are intact. No mandible fracture or displacement is seen. Lucency/defect at the base of the next to last RIGHT mandibular tooth, most likely periapical dental abscess, less likely acute traumatic dislodgement. Orbits: Negative. No traumatic or inflammatory finding. Sinuses: Clear. Soft tissues: Mild soft tissue edema overlying the upper RIGHT orbit/lower frontal bone. No underlying fracture. CT CERVICAL SPINE FINDINGS Alignment: Within normal limits. No evidence of acute vertebral body subluxation. Skull base and vertebrae: No fracture line or displaced fracture fragment is seen. Soft tissues and spinal canal: No prevertebral fluid or swelling. No visible canal hematoma. Disc levels:  Disc spaces are well maintained throughout. Upper chest: Negative. Other: None. IMPRESSION: 1. No acute intracranial abnormality. No intracranial mass, hemorrhage or edema. No skull fracture. 2. No facial bone fracture or displacement. Mild soft tissue edema overlying the upper RIGHT orbit/lower frontal bone. 3. Lucency/defect at the base of the next to last RIGHT mandibular tooth, most likely periapical dental abscess/carie, less likely acute traumatic dislodgement. 4. No fracture or  acute subluxation within the cervical spine. Electronically Signed   By: Bary Richard M.D.   On: 03/25/2020 15:37   CT Maxillofacial Wo Contrast  Result Date: 03/25/2020 CLINICAL DATA:  Fall, laceration under RIGHT eye. EXAM: CT HEAD WITHOUT CONTRAST CT MAXILLOFACIAL WITHOUT CONTRAST CT CERVICAL SPINE WITHOUT CONTRAST TECHNIQUE: Multidetector CT imaging of the head, cervical spine, and maxillofacial structures were performed using the standard protocol without intravenous contrast. Multiplanar CT image reconstructions of the cervical spine and maxillofacial structures were also generated. COMPARISON:  None. FINDINGS: CT HEAD  FINDINGS Brain: Ventricles are within normal limits in size and configuration. There is no mass, hemorrhage, edema or other evidence of acute parenchymal abnormality. No extra-axial hemorrhage. Vascular: Chronic calcified atherosclerotic changes of the large vessels at the skull base. No unexpected hyperdense vessel. Skull: Normal. Negative for fracture or focal lesion. Other: None. CT MAXILLOFACIAL FINDINGS Osseous: Lower frontal bones are intact. No displaced nasal bone fracture. Osseous structures about the orbits are intact and normally aligned bilaterally. Walls of the maxillary sinuses are intact and normally aligned. Bilateral zygomatic arches and pterygoid plates are intact. No mandible fracture or displacement is seen. Lucency/defect at the base of the next to last RIGHT mandibular tooth, most likely periapical dental abscess, less likely acute traumatic dislodgement. Orbits: Negative. No traumatic or inflammatory finding. Sinuses: Clear. Soft tissues: Mild soft tissue edema overlying the upper RIGHT orbit/lower frontal bone. No underlying fracture. CT CERVICAL SPINE FINDINGS Alignment: Within normal limits. No evidence of acute vertebral body subluxation. Skull base and vertebrae: No fracture line or displaced fracture fragment is seen. Soft tissues and spinal canal: No  prevertebral fluid or swelling. No visible canal hematoma. Disc levels:  Disc spaces are well maintained throughout. Upper chest: Negative. Other: None. IMPRESSION: 1. No acute intracranial abnormality. No intracranial mass, hemorrhage or edema. No skull fracture. 2. No facial bone fracture or displacement. Mild soft tissue edema overlying the upper RIGHT orbit/lower frontal bone. 3. Lucency/defect at the base of the next to last RIGHT mandibular tooth, most likely periapical dental abscess/carie, less likely acute traumatic dislodgement. 4. No fracture or acute subluxation within the cervical spine. Electronically Signed   By: Bary Richard M.D.   On: 03/25/2020 15:37    Procedures .Marland KitchenLaceration Repair  Date/Time: 03/25/2020 3:48 PM Performed by: Linwood Dibbles, PA-C Authorized by: Linwood Dibbles, PA-C   Consent:    Consent obtained:  Verbal   Consent given by:  Patient   Risks discussed:  Infection, need for additional repair, pain, poor cosmetic result and poor wound healing   Alternatives discussed:  No treatment and delayed treatment Universal protocol:    Procedure explained and questions answered to patient or proxy's satisfaction: yes     Relevant documents present and verified: yes     Test results available and properly labeled: yes     Imaging studies available: yes     Required blood products, implants, devices, and special equipment available: yes     Site/side marked: yes     Immediately prior to procedure, a time out was called: yes     Patient identity confirmed:  Verbally with patient and arm band Anesthesia (see MAR for exact dosages):    Anesthesia method:  None Laceration details:    Location:  Face   Face location:  R lower eyelid   Extent:  Superficial   Length (cm):  1   Depth (mm):  2 Repair type:    Repair type:  Simple Pre-procedure details:    Preparation:  Patient was prepped and draped in usual sterile fashion and imaging obtained to evaluate for  foreign bodies Exploration:    Hemostasis achieved with:  Direct pressure   Wound exploration: wound explored through full range of motion and entire depth of wound probed and visualized   Treatment:    Area cleansed with:  Betadine   Amount of cleaning:  Extensive   Irrigation solution:  Sterile saline Skin repair:    Repair method:  Tissue adhesive Approximation:    Approximation:  Close Post-procedure details:    Dressing:  Open (no dressing)   Patient tolerance of procedure:  Tolerated well, no immediate complications  .Critical Care Performed by: Linwood Dibbles, PA-C Authorized by: Linwood Dibbles, PA-C   Critical care provider statement:    Critical care time (minutes):  35   Critical care was necessary to treat or prevent imminent or life-threatening deterioration of the following conditions:  Dehydration and metabolic crisis   Critical care was time spent personally by me on the following activities:  Discussions with consultants, evaluation of patient's response to treatment, examination of patient, ordering and performing treatments and interventions, ordering and review of laboratory studies, ordering and review of radiographic studies, pulse oximetry, re-evaluation of patient's condition, obtaining history from patient or surrogate and review of old charts   (including critical care time)  Medications Ordered in ED Medications  acetaminophen (TYLENOL) tablet 650 mg (has no administration in time range)  alum & mag hydroxide-simeth (MAALOX/MYLANTA) 200-200-20 MG/5ML suspension 30 mL (has no administration in time range)  magnesium hydroxide (MILK OF MAGNESIA) suspension 30 mL (has no administration in time range)  hydrOXYzine (ATARAX/VISTARIL) tablet 25 mg (has no administration in time range)  traZODone (DESYREL) tablet 50 mg (has no administration in time range)  haloperidol (HALDOL) tablet 5 mg (has no administration in time range)    And  benztropine  (COGENTIN) tablet 1 mg (has no administration in time range)  multivitamin with minerals tablet 1 tablet (1 tablet Oral Given 03/25/20 0821)  feeding supplement (ENSURE ENLIVE) (ENSURE ENLIVE) liquid 237 mL (237 mLs Oral Not Given 03/25/20 1337)  feeding supplement (BOOST / RESOURCE BREEZE) liquid 1 Container (1 Container Oral Not Given 03/25/20 1504)  megestrol (MEGACE) tablet 40 mg (40 mg Oral Not Given 03/25/20 1900)  Lumateperone Tosylate CAPS 42 mg (42 mg Oral Given 03/25/20 0822)  dronabinol (MARINOL) capsule 5 mg (5 mg Oral Given 03/25/20 1222)  haloperidol decanoate (HALDOL DECANOATE) 100 MG/ML injection 100 mg (100 mg Intramuscular Given 03/15/20 1840)  sodium chloride 0.9 % bolus 1,000 mL (0 mLs Intravenous Stopped 03/22/20 0256)  potassium chloride 10 mEq in 100 mL IVPB (0 mEq Intravenous Stopped 03/22/20 0257)  potassium chloride SA (KLOR-CON) CR tablet 40 mEq (40 mEq Oral Given 03/22/20 0019)  sodium chloride 0.9 % bolus 1,000 mL (0 mLs Intravenous Stopped 03/22/20 0256)  potassium chloride 10 mEq in 100 mL IVPB (0 mEq Intravenous Stopped 03/22/20 0817)  potassium chloride SA (KLOR-CON) CR tablet 20 mEq (20 mEq Oral Given 03/25/20 1232)  sodium chloride 0.9 % bolus 1,000 mL (0 mLs Intravenous Stopped 03/25/20 1701)  potassium chloride 10 mEq in 100 mL IVPB (10 mEq Intravenous New Bag/Given 03/25/20 1956)  magnesium sulfate IVPB 2 g 50 mL (2 g Intravenous New Bag/Given 03/25/20 2123)  potassium chloride SA (KLOR-CON) CR tablet 40 mEq (40 mEq Oral Given 03/25/20 1709)  sodium chloride 0.9 % bolus 1,000 mL (0 mLs Intravenous Stopped 03/25/20 1955)  potassium chloride 10 mEq in 100 mL IVPB (0 mEq Intravenous Stopped 03/25/20 1956)   ED Course  I have reviewed the triage vital signs and the nursing notes.  Pertinent labs & imaging results that were available during my care of the patient were reviewed by me and considered in my medical decision making (see chart for details).  59 year old female presents for evaluation  of possible dehydration.  Patient currently under IVC at behavioral health for paranoid schizophrenia.  Unfortunately has been seen multiple times due to  anorexia from her paranoia causing hemodynamic instability.  She was actually discharged a week and half ago for persistent hypoglycemia due to decreased p.o. intake.  Had episode today where she felt lightheaded and had a fall.  Patient and staff at facility denies LOC or syncope.  She is not anticoagulated.  She did sustain a 1 cm superficial laceration over right zygomatic.  Her MOM's are intact without pain.  Low suspicion for orbital entrapment.  Her blood pressures are 90 systolic here.  She is currently asymptomatic.  I did offer p.o. fluids here as well as food however patient declined.  She denies SI, HI, AVH.  According to Dr. Billee Cashing note with psychiatry they are hoping patient can be transferred [facility] nutrition due to her anorexia.  Prior hospitalist admission thought patient's hyperglycemia was due to her schizophrenia.  Plan on labs, imaging and reassess  Labs and imaging personally reviewed and interpreted: UA negative for infection Lactic acid 1.6 Magnesium low at 1.6 will replace Metabolic panel with hypokalemia at 2.6, Creatinine 0.71, calcium 6.9, total protein 4.8, Albumin 2.5 CBC without leukocytosis, Hgb 9.6, patient denies any melanotic or BRBPR, patient declined rectal exam to assess for occult blood CT head, max face and CT cervical without acute findings.  Facial laceration with dermabond. EKG not pulling over into Epic, no STEMI, No EKg changes indicative of her metabolic derangements.  Patient refused IV supplementation for nutrition. Refuses to tolerate PO intake of food products  Plan on replacing patient electrolytes and IVF. Likely low 2/2 anorexia from her paranoia.  Patient's BP with improvement to 100's systolic.  Patient with negative orthostatic vital signs.  Continues to have blood  pressure greater than 100 systolic.  Refuses any p.o. intake.  2145: Patient requesting food and drink. Was given soda and Sandwich. Patient denies any lightheadedness or dizziness. Lowest systolic BP at 94 on arrival however subsequent BP increased to >100 systolic throughout remaining dc stay. Orthostatic VS reassuring with no drop in BP and systolic greater than 100.  Plan on BMP recheck.   Care transferred to couture, PA-C who will follow up on repeat potassium.  If potassium has improved likely consult with psychiatry for transfer back to [facility].   Discussed with attending, Dr. Estell Harpin who agrees above treatment, plan and disposition.    MDM Rules/Calculators/A&P                           Final Clinical Impression(s) / ED Diagnoses Final diagnoses:  Hypokalemia  Hypotension due to hypovolemia  History of schizophrenia    Rx / DC Orders ED Discharge Orders    None       Jenny Lai A, PA-C 03/25/20 2149    Bethann Berkshire, MD 03/26/20 [phone]

## 2020-03-25 NOTE — ED Notes (Signed)
Date and time results received: 03/25/20 10:51 PM  K+ 2.6 Calcium 5.9  Name of Provider Notified: Lockie Mola, EDP

## 2020-03-25 NOTE — ED Notes (Addendum)
Pt transferred from [facility] after a fall hitting head, laceration under rt eye, bleeding controlled. Hypotensive at 77/55 after fall. ? Confusion after fall. Pt transported from [facility] by EMS, #18 RH, CBG 121, 95/62-75-98% RA. Sitter with pt.

## 2020-03-25 NOTE — Tx Team (Signed)
Interdisciplinary Treatment and Diagnostic Plan Update  03/25/2020 Time of Session: 10:40AM Neera Teng MRN: [id number]  Principal Diagnosis: <principal problem not specified>  Secondary Diagnoses: Active Problems:   Schizophrenia (HCC)   Current Medications:  Current Facility-Administered Medications  Medication Dose Route Frequency Provider Last Rate Last Admin  . acetaminophen (TYLENOL) tablet 650 mg  650 mg Oral Q6H PRN Antonieta Pert, MD      . alum & mag hydroxide-simeth (MAALOX/MYLANTA) 200-200-20 MG/5ML suspension 30 mL  30 mL Oral Q4H PRN Antonieta Pert, MD      . haloperidol (HALDOL) tablet 5 mg  5 mg Oral Q6H PRN Antonieta Pert, MD       And  . benztropine (COGENTIN) tablet 1 mg  1 mg Oral Q6H PRN Antonieta Pert, MD      . dronabinol (MARINOL) capsule 5 mg  5 mg Oral BID AC Aldean Baker, NP   5 mg at 03/24/20 1718  . feeding supplement (BOOST / RESOURCE BREEZE) liquid 1 Container  1 Container Oral BID BM Antonieta Pert, MD   1 Container at 03/25/20 [phone]  . feeding supplement (ENSURE ENLIVE) (ENSURE ENLIVE) liquid 237 mL  237 mL Oral BID BM Antonieta Pert, MD      . hydrOXYzine (ATARAX/VISTARIL) tablet 25 mg  25 mg Oral TID PRN Antonieta Pert, MD      . Lumateperone Tosylate CAPS 42 mg  42 mg Oral Daily Antonieta Pert, MD   42 mg at 03/25/20 8315  . magnesium hydroxide (MILK OF MAGNESIA) suspension 30 mL  30 mL Oral Daily PRN Antonieta Pert, MD      . megestrol (MEGACE) tablet 40 mg  40 mg Oral QID Antonieta Pert, MD   40 mg at 03/25/20 1761  . mirtazapine (REMERON) tablet 30 mg  30 mg Oral QHS Antonieta Pert, MD   30 mg at 03/24/20 2038  . multivitamin with minerals tablet 1 tablet  1 tablet Oral Daily Antonieta Pert, MD   1 tablet at 03/25/20 [phone]  . traZODone (DESYREL) tablet 50 mg  50 mg Oral QHS PRN Antonieta Pert, MD       PTA Medications: Medications Prior to Admission  Medication Sig Dispense Refill Last Dose  .  benztropine (COGENTIN) 0.5 MG tablet Take 1 tablet (0.5 mg total) by mouth 2 (two) times daily as needed for tremors.     . haloperidol (HALDOL) 10 MG tablet Take 1 tablet (10 mg total) by mouth 3 (three) times daily.     . hydrOXYzine (ATARAX/VISTARIL) 25 MG tablet Take 1 tablet (25 mg total) by mouth 3 (three) times daily as needed for anxiety. 30 tablet 0   . megestrol (MEGACE) 40 MG tablet Take 1 tablet (40 mg total) by mouth daily.     . paliperidone (INVEGA) 6 MG 24 hr tablet Take 1 tablet (6 mg total) by mouth at bedtime.     . traZODone (DESYREL) 50 MG tablet Take 1 tablet (50 mg total) by mouth at bedtime.       Patient Stressors: Health problems Medication change or noncompliance  Patient Strengths: Supportive family/friends  Treatment Modalities: Medication Management, Group therapy, Case management,  1 to 1 session with clinician, Psychoeducation, Recreational therapy.   Physician Treatment Plan for Primary Diagnosis: <principal problem not specified> Long Term Goal(s):     Short Term Goals:    Medication Management: Evaluate patient's response, side effects, and tolerance of  medication regimen.  Therapeutic Interventions: 1 to 1 sessions, Unit Group sessions and Medication administration.  Evaluation of Outcomes: Progressing  Physician Treatment Plan for Secondary Diagnosis: Active Problems:   Schizophrenia (HCC)  Long Term Goal(s):     Short Term Goals:       Medication Management: Evaluate patient's response, side effects, and tolerance of medication regimen.  Therapeutic Interventions: 1 to 1 sessions, Unit Group sessions and Medication administration.  Evaluation of Outcomes: Progressing   RN Treatment Plan for Primary Diagnosis: <principal problem not specified> Long Term Goal(s): Knowledge of disease and therapeutic regimen to maintain health will improve  Short Term Goals: Ability to remain free from injury will improve, Ability to verbalize  frustration and anger appropriately will improve, Ability to demonstrate self-control, Ability to participate in decision making will improve, Ability to verbalize feelings will improve, Ability to disclose and discuss suicidal ideas, Ability to identify and develop effective coping behaviors will improve and Compliance with prescribed medications will improve  Medication Management: RN will administer medications as ordered by provider, will assess and evaluate patient's response and provide education to patient for prescribed medication. RN will report any adverse and/or side effects to prescribing provider.  Therapeutic Interventions: 1 on 1 counseling sessions, Psychoeducation, Medication administration, Evaluate responses to treatment, Monitor vital signs and CBGs as ordered, Perform/monitor CIWA, COWS, AIMS and Fall Risk screenings as ordered, Perform wound care treatments as ordered.  Evaluation of Outcomes: Progressing   LCSW Treatment Plan for Primary Diagnosis: <principal problem not specified> Long Term Goal(s): Safe transition to appropriate next level of care at discharge, Engage patient in therapeutic group addressing interpersonal concerns.  Short Term Goals: Engage patient in aftercare planning with referrals and resources, Increase social support, Increase ability to appropriately verbalize feelings, Increase emotional regulation, Facilitate acceptance of mental health diagnosis and concerns, Identify triggers associated with mental health/substance abuse issues and Increase skills for wellness and recovery  Therapeutic Interventions: Assess for all discharge needs, 1 to 1 time with Social worker, Explore available resources and support systems, Assess for adequacy in community support network, Educate family and significant other(s) on suicide prevention, Complete Psychosocial Assessment, Interpersonal group therapy.  Evaluation of Outcomes: Progressing   Progress in  Treatment: Attending groups: No. Participating in groups: No. Taking medication as prescribed: Yes. Toleration medication: No. Family/Significant other contact made: Yes, individual(s) contacted:  sister Patient understands diagnosis: No. Discussing patient identified problems/goals with staff: No. Medical problems stabilized or resolved: No. Denies suicidal/homicidal ideation: Yes. Issues/concerns per patient self-inventory: No.  New problem(s) identified: Yes, Describe:  patient continues to decline nutritional supplements   New Short Term/Long Term Goal(s): medication stabilization, elimination of SI thoughts, development of comprehensive mental wellness plan.   Patient Goals:  Did not attend  Discharge Plan or Barriers:   Reason for Continuation of Hospitalization: Anxiety Delusions  Depression Hallucinations Medication stabilization Suicidal ideation  Estimated Length of Stay: 3-5 days  Attendees: Patient: 03/25/2020   Physician:  03/25/2020   Nursing:  03/25/2020   RN Care Manager: 03/25/2020   Social Worker: Ruthann Cancer, LCSW 03/25/2020   Recreational Therapist:  03/25/2020   Other:  03/25/2020   Other:  03/25/2020   Other: 03/25/2020      Scribe for Treatment Team: Otelia Santee, LCSW 03/25/2020 10:59 AM

## 2020-03-25 NOTE — Progress Notes (Signed)
[name], charge at [facility] to notify her of EMS arrival with patient. MHT is with patient. IVC paperwork and demographics given to EMS personnel.

## 2020-03-26 ENCOUNTER — Other Ambulatory Visit: Payer: Self-pay

## 2020-03-26 ENCOUNTER — Encounter (HOSPITAL_COMMUNITY): Payer: Self-pay | Admitting: Emergency Medicine

## 2020-03-26 ENCOUNTER — Inpatient Hospital Stay (HOSPITAL_COMMUNITY): Payer: Medicare Other

## 2020-03-26 DIAGNOSIS — Z20822 Contact with and (suspected) exposure to covid-19: Secondary | ICD-10-CM | POA: Diagnosis present

## 2020-03-26 DIAGNOSIS — Z9114 Patient's other noncompliance with medication regimen: Secondary | ICD-10-CM | POA: Diagnosis not present

## 2020-03-26 DIAGNOSIS — I959 Hypotension, unspecified: Secondary | ICD-10-CM | POA: Diagnosis present

## 2020-03-26 DIAGNOSIS — Y92009 Unspecified place in unspecified non-institutional (private) residence as the place of occurrence of the external cause: Secondary | ICD-10-CM | POA: Diagnosis not present

## 2020-03-26 DIAGNOSIS — E876 Hypokalemia: Secondary | ICD-10-CM

## 2020-03-26 DIAGNOSIS — R630 Anorexia: Secondary | ICD-10-CM | POA: Diagnosis not present

## 2020-03-26 DIAGNOSIS — Z681 Body mass index (BMI) 19 or less, adult: Secondary | ICD-10-CM | POA: Diagnosis not present

## 2020-03-26 DIAGNOSIS — F200 Paranoid schizophrenia: Secondary | ICD-10-CM

## 2020-03-26 DIAGNOSIS — W19XXXA Unspecified fall, initial encounter: Secondary | ICD-10-CM | POA: Diagnosis not present

## 2020-03-26 DIAGNOSIS — R55 Syncope and collapse: Secondary | ICD-10-CM | POA: Diagnosis not present

## 2020-03-26 DIAGNOSIS — I10 Essential (primary) hypertension: Secondary | ICD-10-CM | POA: Diagnosis present

## 2020-03-26 DIAGNOSIS — G4700 Insomnia, unspecified: Secondary | ICD-10-CM | POA: Diagnosis present

## 2020-03-26 DIAGNOSIS — E878 Other disorders of electrolyte and fluid balance, not elsewhere classified: Secondary | ICD-10-CM

## 2020-03-26 DIAGNOSIS — E43 Unspecified severe protein-calorie malnutrition: Secondary | ICD-10-CM

## 2020-03-26 DIAGNOSIS — F17210 Nicotine dependence, cigarettes, uncomplicated: Secondary | ICD-10-CM | POA: Diagnosis present

## 2020-03-26 DIAGNOSIS — E440 Moderate protein-calorie malnutrition: Secondary | ICD-10-CM | POA: Diagnosis present

## 2020-03-26 DIAGNOSIS — D649 Anemia, unspecified: Secondary | ICD-10-CM | POA: Diagnosis present

## 2020-03-26 DIAGNOSIS — N179 Acute kidney failure, unspecified: Secondary | ICD-10-CM | POA: Diagnosis present

## 2020-03-26 LAB — COMPREHENSIVE METABOLIC PANEL
ALT: 8 U/L (ref 0–44)
AST: 16 U/L (ref 15–41)
Albumin: 2.8 g/dL — ABNORMAL LOW (ref 3.5–5.0)
Alkaline Phosphatase: 41 U/L (ref 38–126)
Anion gap: 10 (ref 5–15)
BUN: 5 mg/dL — ABNORMAL LOW (ref 6–20)
CO2: 24 mmol/L (ref 22–32)
Calcium: 7.4 mg/dL — ABNORMAL LOW (ref 8.9–10.3)
Chloride: 107 mmol/L (ref 98–111)
Creatinine, Ser: 0.62 mg/dL (ref 0.44–1.00)
GFR calc Af Amer: >60 mL/min (ref >60)
GFR calc non Af Amer: >60 mL/min (ref >60)
Glucose, Bld: 108 mg/dL — ABNORMAL HIGH (ref 70–99)
Potassium: 2.8 mmol/L — ABNORMAL LOW (ref 3.5–5.1)
Sodium: 141 mmol/L (ref 135–145)
Total Bilirubin: 0.3 mg/dL (ref 0.3–1.2)
Total Protein: 5.2 g/dL — ABNORMAL LOW (ref 6.5–8.1)

## 2020-03-26 LAB — CBC WITH DIFFERENTIAL/PLATELET
Abs Immature Granulocytes: 0 10*3/uL (ref 0.00–0.07)
Basophils Absolute: 0 10*3/uL (ref 0.0–0.1)
Basophils Relative: 0 %
Eosinophils Absolute: 0.1 10*3/uL (ref 0.0–0.5)
Eosinophils Relative: 2 %
HCT: 38.6 % (ref 36.0–46.0)
Hemoglobin: 12.5 g/dL (ref 12.0–15.0)
Immature Granulocytes: 0 %
Lymphocytes Relative: 28 %
Lymphs Abs: 1.4 10*3/uL (ref 0.7–4.0)
MCH: 28.4 pg (ref 26.0–34.0)
MCHC: 32.4 g/dL (ref 30.0–36.0)
MCV: 87.7 fL (ref 80.0–100.0)
Monocytes Absolute: 0.5 10*3/uL (ref 0.1–1.0)
Monocytes Relative: 9 %
Neutro Abs: 3 10*3/uL (ref 1.7–7.7)
Neutrophils Relative %: 61 %
Platelets: 175 10*3/uL (ref 150–400)
RBC: 4.4 MIL/uL (ref 3.87–5.11)
RDW: 12.6 % (ref 11.5–15.5)
WBC: 5 10*3/uL (ref 4.0–10.5)
nRBC: 0 % (ref 0.0–0.2)

## 2020-03-26 LAB — CORTISOL: Cortisol, Plasma: 8.3 ug/dL

## 2020-03-26 LAB — SARS CORONAVIRUS 2 (TAT 6-24 HRS): SARS Coronavirus 2: NEGATIVE

## 2020-03-26 LAB — LACTIC ACID, PLASMA: Lactic Acid, Venous: 1.9 mmol/L (ref 0.5–1.9)

## 2020-03-26 LAB — URINE CULTURE: Culture: 40000 — AB

## 2020-03-26 LAB — MAGNESIUM: Magnesium: 1.9 mg/dL (ref 1.7–2.4)

## 2020-03-26 LAB — PREALBUMIN: Prealbumin: 7.7 mg/dL — ABNORMAL LOW (ref 18–38)

## 2020-03-26 LAB — POTASSIUM: Potassium: 3.5 mmol/L (ref 3.5–5.1)

## 2020-03-26 LAB — PHOSPHORUS: Phosphorus: 1.6 mg/dL — ABNORMAL LOW (ref 2.5–4.6)

## 2020-03-26 MED ORDER — POTASSIUM PHOSPHATES 15 MMOLE/5ML IV SOLN
30.0000 mmol | Freq: Once | INTRAVENOUS | Status: AC
  Administered 2020-03-26: 30 mmol via INTRAVENOUS
  Filled 2020-03-26: qty 10

## 2020-03-26 MED ORDER — CALCIUM GLUCONATE-NACL 1-0.675 GM/50ML-% IV SOLN
1.0000 g | Freq: Once | INTRAVENOUS | Status: AC
  Administered 2020-03-26: 1000 mg via INTRAVENOUS
  Filled 2020-03-26: qty 50

## 2020-03-26 MED ORDER — MEGESTROL ACETATE 40 MG PO TABS
40.0000 mg | ORAL_TABLET | Freq: Four times a day (QID) | ORAL | Status: DC
  Administered 2020-03-26 – 2020-04-01 (×26): 40 mg via ORAL
  Filled 2020-03-26 (×29): qty 1

## 2020-03-26 MED ORDER — CALCIUM GLUCONATE-NACL 2-0.675 GM/100ML-% IV SOLN: 2.0000 g | Freq: Once | INTRAVENOUS | Status: DC

## 2020-03-26 MED ORDER — POTASSIUM CHLORIDE IN NACL 20-0.9 MEQ/L-% IV SOLN
INTRAVENOUS | Status: DC
  Filled 2020-03-26 (×3): qty 1000

## 2020-03-26 MED ORDER — SODIUM CHLORIDE 0.9 % IV BOLUS
1000.0000 mL | Freq: Once | INTRAVENOUS | Status: AC
  Administered 2020-03-26: 1000 mL via INTRAVENOUS

## 2020-03-26 MED ORDER — DRONABINOL 5 MG PO CAPS
5.0000 mg | ORAL_CAPSULE | Freq: Two times a day (BID) | ORAL | Status: DC
  Administered 2020-03-26 – 2020-04-01 (×14): 5 mg via ORAL
  Filled 2020-03-26 (×15): qty 1

## 2020-03-26 MED ORDER — LUMATEPERONE TOSYLATE 42 MG PO CAPS
42.0000 mg | ORAL_CAPSULE | Freq: Every day | ORAL | Status: DC
  Administered 2020-03-27 – 2020-03-31 (×6): 42 mg via ORAL

## 2020-03-26 MED ORDER — COSYNTROPIN 0.25 MG IJ SOLR
0.2500 mg | Freq: Once | INTRAMUSCULAR | Status: AC
  Administered 2020-03-27: 0.25 mg via INTRAVENOUS
  Filled 2020-03-26 (×2): qty 0.25

## 2020-03-26 MED ORDER — POTASSIUM CHLORIDE CRYS ER 20 MEQ PO TBCR
40.0000 meq | EXTENDED_RELEASE_TABLET | ORAL | Status: DC
  Administered 2020-03-26: 40 meq via ORAL
  Filled 2020-03-26: qty 2

## 2020-03-26 MED ORDER — ADULT MULTIVITAMIN W/MINERALS CH
1.0000 | ORAL_TABLET | Freq: Every day | ORAL | Status: DC
  Administered 2020-03-26 – 2020-04-01 (×7): 1 via ORAL
  Filled 2020-03-26 (×7): qty 1

## 2020-03-26 MED ORDER — ENOXAPARIN SODIUM 40 MG/0.4ML ~~LOC~~ SOLN
40.0000 mg | SUBCUTANEOUS | Status: DC
  Administered 2020-03-26 – 2020-04-01 (×7): 40 mg via SUBCUTANEOUS
  Filled 2020-03-26 (×7): qty 0.4

## 2020-03-26 MED ORDER — ACETAMINOPHEN 650 MG RE SUPP: 650.0000 mg | Freq: Four times a day (QID) | RECTAL | Status: DC | PRN

## 2020-03-26 MED ORDER — ACETAMINOPHEN 325 MG PO TABS: 650.0000 mg | ORAL_TABLET | Freq: Four times a day (QID) | ORAL | Status: DC | PRN

## 2020-03-26 MED ORDER — SODIUM CHLORIDE 0.9 % IV SOLN: INTRAVENOUS | Status: DC

## 2020-03-26 MED ORDER — TRAZODONE HCL 50 MG PO TABS: 50.0000 mg | ORAL_TABLET | Freq: Every evening | ORAL | Status: DC | PRN

## 2020-03-26 NOTE — H&P (Signed)
History and Physical    [name] AYT:[id number] DOB: 07-Jun-1961 DOA: 03/25/2020  [facility]P: [facility] coming from: Behavioral health  Chief Complaint: Fall, hypotension  HPI: [name] is a 59 y.o. female with medical history significant of paranoid schizophrenia, medication noncompliance presented to the ED from behavioral health for evaluation of fall and hypotension.  No loss of consciousness or syncope reported.  She is not anticoagulated.  She arrives from behavioral health under IVC for paranoid behavior.  Recently admitted for decreased p.o. intake and persistent hypoglycemia requiring IV dextrose.  She was discharged back to behavioral health.  She has had persistently low blood pressures with systolic in the 70s.  Patient states she felt disoriented and lightheaded when walking and fell on the ground.  Reports hitting her right orbital area on the ground.  Denies any other injuries from the fall.  States she has not been eating food at behavioral health because she does not like it and her body is not able to digest it.  Denies any chest pain, shortness of breath, or palpitations.  She has not been vaccinated against Covid.  Denies fevers or cough.  Denies nausea, vomiting, abdominal pain, or diarrhea.  ED Course: Hypotensive with systolic as low as 60s.  Orthostatics negative.  WBC 4.3, hemoglobin 9.6, hematocrit 29.7, platelet 125k.  Sodium 140, potassium 2.6, chloride 103, bicarb 27, BUN 5, creatinine 0.7, glucose 85, calcium 6.9, total protein 4.8, albumin 2.5, LFTs normal.  Magnesium 1.6.  Blood culture x2 pending.  UA with 5 ketones and not suggestive of infection.  Urine culture pending.  Repeat labs showing calcium 5.9.  FOBT negative.  Hemoglobin 12.5 and platelet count 175k on repeat labs.  CT head negative for acute intracranial abnormality.  CT maxillofacial negative for facial bone fracture or displacement.  CT C-spine negative for fracture or acute  subluxation.  Patient was given potassium supplementation in the ED, repeat potassium 3.5.  Review of Systems:  All systems reviewed and apart from history of presenting illness, are negative.  Past Medical History:  Diagnosis Date  . Non compliance w medication regimen   . Schizophrenia (HCC)     History reviewed. No pertinent surgical history.   reports that she has been smoking cigarettes. She has never used smokeless tobacco. She reports current alcohol use. She reports that she does not use drugs.  Allergies  Allergen Reactions  . Aspirin Other (See Comments)    Possibly caused hives per sister  . Geodon [Ziprasidone Hcl] Other (See Comments)    Caused twitching    History reviewed. No pertinent family history.  Prior to Admission medications   Medication Sig Start Date End Date Taking? Authorizing Provider  benztropine (COGENTIN) 0.5 MG tablet Take 1 tablet (0.5 mg total) by mouth 2 (two) times daily as needed for tremors. 03/15/20   Burnadette Pop, MD  haloperidol (HALDOL) 10 MG tablet Take 1 tablet (10 mg total) by mouth 3 (three) times daily. 03/15/20   Burnadette Pop, MD  hydrOXYzine (ATARAX/VISTARIL) 25 MG tablet Take 1 tablet (25 mg total) by mouth 3 (three) times daily as needed for anxiety. 03/15/20   Burnadette Pop, MD  megestrol (MEGACE) 40 MG tablet Take 1 tablet (40 mg total) by mouth daily. 03/15/20   Burnadette Pop, MD  paliperidone (INVEGA) 6 MG 24 hr tablet Take 1 tablet (6 mg total) by mouth at bedtime. 03/15/20   Burnadette Pop, MD  traZODone (DESYREL) 50 MG tablet Take 1 tablet (50  mg total) by mouth at bedtime. 03/15/20   Burnadette PopAdhikari, Amrit, MD    Physical Exam: Vitals:   03/26/20 0000 03/26/20 0005 03/26/20 0030  BP: 94/62  (!) 87/54  Pulse: 71  66  Resp: 15  14  SpO2: 100%  100%  Weight:  50.3 kg   Height:  5\' 9"  (1.753 m)     Physical Exam Constitutional:      General: She is not in acute distress. HENT:     Head: Normocephalic.      Comments: Right zygomatic facial laceration with Dermabond    Mouth/Throat:     Mouth: Mucous membranes are dry.  Eyes:     Extraocular Movements: Extraocular movements intact.     Conjunctiva/sclera: Conjunctivae normal.  Cardiovascular:     Rate and Rhythm: Normal rate and regular rhythm.     Pulses: Normal pulses.  Pulmonary:     Effort: Pulmonary effort is normal. No respiratory distress.     Breath sounds: Normal breath sounds. No wheezing or rales.  Abdominal:     General: Bowel sounds are normal. There is no distension.     Palpations: Abdomen is soft.     Tenderness: There is no abdominal tenderness.  Musculoskeletal:        General: No swelling or tenderness.     Cervical back: Normal range of motion and neck supple.  Skin:    General: Skin is warm and dry.  Neurological:     General: No focal deficit present.     Mental Status: She is alert and oriented to person, place, and time.     Labs on Admission: I have personally reviewed following labs and imaging studies  CBC: Recent Labs  Lab 03/21/20 1719 03/25/20 1406 03/26/20 0004  WBC 6.3 4.3 5.0  NEUTROABS  --  2.6 3.0  HGB 14.6 9.6* 12.5  HCT 43.7 29.7* 38.6  MCV 84.5 88.1 87.7  PLT 213 125* 175   Basic Metabolic Panel: Recent Labs  Lab 03/21/20 1719 03/22/20 0422 03/25/20 1406 03/25/20 2215 03/25/20 2256 03/26/20 0004  NA 138 141 140 142  --   --   K 2.4* 3.0* 2.6* 2.6*  --  3.5  CL 95* 101 103 112*  --   --   CO2 31 27 27  21*  --   --   GLUCOSE 96 83 85 83  --   --   BUN <5* <5* 5* <5*  --   --   CREATININE 0.69 0.58 0.71 0.49  --   --   CALCIUM 9.2 8.0* 6.9* 5.9*  --   --   MG  --   --  1.6*  --  4.0*  --    GFR: Estimated Creatinine Clearance: 60.1 mL/min (by C-G formula based on SCr of 0.49 mg/dL). Liver Function Tests: Recent Labs  Lab 03/25/20 1406  AST 16  ALT 7  ALKPHOS 39  BILITOT 0.4  PROT 4.8*  ALBUMIN 2.5*   No results for input(s): LIPASE, AMYLASE in the last 168  hours. No results for input(s): AMMONIA in the last 168 hours. Coagulation Profile: No results for input(s): INR, PROTIME in the last 168 hours. Cardiac Enzymes: No results for input(s): CKTOTAL, CKMB, CKMBINDEX, TROPONINI in the last 168 hours. BNP (last 3 results) No results for input(s): PROBNP in the last 8760 hours. HbA1C: No results for input(s): HGBA1C in the last 72 hours. CBG: Recent Labs  Lab 03/21/20 0556 03/23/20 [id number] 03/24/20 [id number] 03/25/20 [id number]  03/25/20 1712  GLUCAP 149* 92 97 77 94   Lipid Profile: No results for input(s): CHOL, HDL, LDLCALC, TRIG, CHOLHDL, LDLDIRECT in the last 72 hours. Thyroid Function Tests: No results for input(s): TSH, T4TOTAL, FREET4, T3FREE, THYROIDAB in the last 72 hours. Anemia Panel: No results for input(s): VITAMINB12, FOLATE, FERRITIN, TIBC, IRON, RETICCTPCT in the last 72 hours. Urine analysis:    Component Value Date/Time   COLORURINE STRAW (A) 03/25/2020 1608   APPEARANCEUR CLEAR 03/25/2020 1608   LABSPEC 1.004 (L) 03/25/2020 1608   PHURINE 6.0 03/25/2020 1608   GLUCOSEU NEGATIVE 03/25/2020 1608   HGBUR SMALL (A) 03/25/2020 1608   BILIRUBINUR NEGATIVE 03/25/2020 1608   KETONESUR 5 (A) 03/25/2020 1608   PROTEINUR NEGATIVE 03/25/2020 1608   UROBILINOGEN 0.2 04/10/2015 1111   NITRITE NEGATIVE 03/25/2020 1608   LEUKOCYTESUR NEGATIVE 03/25/2020 1608    Radiological Exams on Admission: CT Head Wo Contrast  Result Date: 03/25/2020 CLINICAL DATA:  Fall, laceration under RIGHT eye. EXAM: CT HEAD WITHOUT CONTRAST CT MAXILLOFACIAL WITHOUT CONTRAST CT CERVICAL SPINE WITHOUT CONTRAST TECHNIQUE: Multidetector CT imaging of the head, cervical spine, and maxillofacial structures were performed using the standard protocol without intravenous contrast. Multiplanar CT image reconstructions of the cervical spine and maxillofacial structures were also generated. COMPARISON:  None. FINDINGS: CT HEAD FINDINGS Brain: Ventricles are within normal  limits in size and configuration. There is no mass, hemorrhage, edema or other evidence of acute parenchymal abnormality. No extra-axial hemorrhage. Vascular: Chronic calcified atherosclerotic changes of the large vessels at the skull base. No unexpected hyperdense vessel. Skull: Normal. Negative for fracture or focal lesion. Other: None. CT MAXILLOFACIAL FINDINGS Osseous: Lower frontal bones are intact. No displaced nasal bone fracture. Osseous structures about the orbits are intact and normally aligned bilaterally. Walls of the maxillary sinuses are intact and normally aligned. Bilateral zygomatic arches and pterygoid plates are intact. No mandible fracture or displacement is seen. Lucency/defect at the base of the next to last RIGHT mandibular tooth, most likely periapical dental abscess, less likely acute traumatic dislodgement. Orbits: Negative. No traumatic or inflammatory finding. Sinuses: Clear. Soft tissues: Mild soft tissue edema overlying the upper RIGHT orbit/lower frontal bone. No underlying fracture. CT CERVICAL SPINE FINDINGS Alignment: Within normal limits. No evidence of acute vertebral body subluxation. Skull base and vertebrae: No fracture line or displaced fracture fragment is seen. Soft tissues and spinal canal: No prevertebral fluid or swelling. No visible canal hematoma. Disc levels:  Disc spaces are well maintained throughout. Upper chest: Negative. Other: None. IMPRESSION: 1. No acute intracranial abnormality. No intracranial mass, hemorrhage or edema. No skull fracture. 2. No facial bone fracture or displacement. Mild soft tissue edema overlying the upper RIGHT orbit/lower frontal bone. 3. Lucency/defect at the base of the next to last RIGHT mandibular tooth, most likely periapical dental abscess/carie, less likely acute traumatic dislodgement. 4. No fracture or acute subluxation within the cervical spine. Electronically Signed   By: Bary Richard M.D.   On: 03/25/2020 15:37   CT Cervical  Spine Wo Contrast  Result Date: 03/25/2020 CLINICAL DATA:  Fall, laceration under RIGHT eye. EXAM: CT HEAD WITHOUT CONTRAST CT MAXILLOFACIAL WITHOUT CONTRAST CT CERVICAL SPINE WITHOUT CONTRAST TECHNIQUE: Multidetector CT imaging of the head, cervical spine, and maxillofacial structures were performed using the standard protocol without intravenous contrast. Multiplanar CT image reconstructions of the cervical spine and maxillofacial structures were also generated. COMPARISON:  None. FINDINGS: CT HEAD FINDINGS Brain: Ventricles are within normal limits in size and configuration. There is  no mass, hemorrhage, edema or other evidence of acute parenchymal abnormality. No extra-axial hemorrhage. Vascular: Chronic calcified atherosclerotic changes of the large vessels at the skull base. No unexpected hyperdense vessel. Skull: Normal. Negative for fracture or focal lesion. Other: None. CT MAXILLOFACIAL FINDINGS Osseous: Lower frontal bones are intact. No displaced nasal bone fracture. Osseous structures about the orbits are intact and normally aligned bilaterally. Walls of the maxillary sinuses are intact and normally aligned. Bilateral zygomatic arches and pterygoid plates are intact. No mandible fracture or displacement is seen. Lucency/defect at the base of the next to last RIGHT mandibular tooth, most likely periapical dental abscess, less likely acute traumatic dislodgement. Orbits: Negative. No traumatic or inflammatory finding. Sinuses: Clear. Soft tissues: Mild soft tissue edema overlying the upper RIGHT orbit/lower frontal bone. No underlying fracture. CT CERVICAL SPINE FINDINGS Alignment: Within normal limits. No evidence of acute vertebral body subluxation. Skull base and vertebrae: No fracture line or displaced fracture fragment is seen. Soft tissues and spinal canal: No prevertebral fluid or swelling. No visible canal hematoma. Disc levels:  Disc spaces are well maintained throughout. Upper chest: Negative.  Other: None. IMPRESSION: 1. No acute intracranial abnormality. No intracranial mass, hemorrhage or edema. No skull fracture. 2. No facial bone fracture or displacement. Mild soft tissue edema overlying the upper RIGHT orbit/lower frontal bone. 3. Lucency/defect at the base of the next to last RIGHT mandibular tooth, most likely periapical dental abscess/carie, less likely acute traumatic dislodgement. 4. No fracture or acute subluxation within the cervical spine. Electronically Signed   By: Bary Richard M.D.   On: 03/25/2020 15:37   CT Maxillofacial Wo Contrast  Result Date: 03/25/2020 CLINICAL DATA:  Fall, laceration under RIGHT eye. EXAM: CT HEAD WITHOUT CONTRAST CT MAXILLOFACIAL WITHOUT CONTRAST CT CERVICAL SPINE WITHOUT CONTRAST TECHNIQUE: Multidetector CT imaging of the head, cervical spine, and maxillofacial structures were performed using the standard protocol without intravenous contrast. Multiplanar CT image reconstructions of the cervical spine and maxillofacial structures were also generated. COMPARISON:  None. FINDINGS: CT HEAD FINDINGS Brain: Ventricles are within normal limits in size and configuration. There is no mass, hemorrhage, edema or other evidence of acute parenchymal abnormality. No extra-axial hemorrhage. Vascular: Chronic calcified atherosclerotic changes of the large vessels at the skull base. No unexpected hyperdense vessel. Skull: Normal. Negative for fracture or focal lesion. Other: None. CT MAXILLOFACIAL FINDINGS Osseous: Lower frontal bones are intact. No displaced nasal bone fracture. Osseous structures about the orbits are intact and normally aligned bilaterally. Walls of the maxillary sinuses are intact and normally aligned. Bilateral zygomatic arches and pterygoid plates are intact. No mandible fracture or displacement is seen. Lucency/defect at the base of the next to last RIGHT mandibular tooth, most likely periapical dental abscess, less likely acute traumatic dislodgement.  Orbits: Negative. No traumatic or inflammatory finding. Sinuses: Clear. Soft tissues: Mild soft tissue edema overlying the upper RIGHT orbit/lower frontal bone. No underlying fracture. CT CERVICAL SPINE FINDINGS Alignment: Within normal limits. No evidence of acute vertebral body subluxation. Skull base and vertebrae: No fracture line or displaced fracture fragment is seen. Soft tissues and spinal canal: No prevertebral fluid or swelling. No visible canal hematoma. Disc levels:  Disc spaces are well maintained throughout. Upper chest: Negative. Other: None. IMPRESSION: 1. No acute intracranial abnormality. No intracranial mass, hemorrhage or edema. No skull fracture. 2. No facial bone fracture or displacement. Mild soft tissue edema overlying the upper RIGHT orbit/lower frontal bone. 3. Lucency/defect at the base of the next to last RIGHT  mandibular tooth, most likely periapical dental abscess/carie, less likely acute traumatic dislodgement. 4. No fracture or acute subluxation within the cervical spine. Electronically Signed   By: Bary Richard M.D.   On: 03/25/2020 15:37    EKG: Personally reviewed.  Poor quality study with artifact.  Repeat EKG has been ordered and currently pending.  Assessment/Plan Principal Problem:   Hypotension Active Problems:   Anorexia   Hypokalemia   Hypocalcemia   Fall at home, initial encounter   Hypotension: Suspect due to severe dehydration.  Patient has paranoid schizophrenia and has been refusing to eat at behavioral health.  No infectious signs or symptoms to suggest sepsis.  Orthostatics checked in the ED negative.  Received 3 L fluid boluses, most recent systolic in the 80s. -Continue IV fluid resuscitation and monitor blood pressure closely.  Check cortisol and lactic acid level.  Anorexia/malnutrition: Patient has paranoid schizophrenia and has been refusing to eat or behavioral health.  Serum total protein 4.8, albumin 2.5.  She was sent here for forced tube  feeds.  Patient has no complaints of nausea, vomiting, or abdominal pain.  Abdominal exam benign.  At present, she is willing to eat.  She ate food in the ED.  -Continue Marinol and Megace for appetite stimulation.  Nutrition consult placed.  Hypokalemia, hypomagnesemia: Likely due to poor oral intake.  Potassium 2.6.  EKG poor quality study with artifact making interpretation challenging. Magnesium 1.6.  Received potassium and magnesium supplementation.  Most recent labs showing potassium 3.5. -Cardiac monitoring.  Repeat EKG.  Continue monitor electrolytes and replete as needed  Hypocalcemia: Suspect due to poor oral intake/malnutrition.  Calcium 5.9, albumin 2.5.  Corrected calcium 7.1. -Calcium supplementation, check ionized calcium level  Fall: No syncope/loss of consciousness reported.  No acute traumatic injuries identified on CT head/maxillofacial/C-spine.   -PT/OT, fall precautions  Paranoid schizophrenia -Continue Caplyta  Insomnia -Continue trazodone at bedtime as needed  DVT prophylaxis: Lovenox Code Status: Full code Family Communication: No family available at this time. Disposition Plan: Status is: Observation  The patient remains OBS appropriate and will d/c before 2 midnights.  Dispo: The patient is from: Behavioral health              Anticipated d/c is to: Behavioral health              Anticipated d/c date is: 2 days              Patient currently is not medically stable to d/c.  The medical decision making on this patient was of high complexity and the patient is at high risk for clinical deterioration, therefore this is a level 3 visit.  John Giovanni MD [facility]  If 7PM-7AM, please contact night-coverage [email or web address]  03/26/2020, 2:04 AM

## 2020-03-26 NOTE — Progress Notes (Signed)
PROGRESS NOTE  Kerri-Ann Wildes QMV:[id number] DOB: 24-Sep-1960   PCP: [facility]  Patient is from: [facility]  DOA: 03/25/2020 LOS: 0  Brief Narrative / Interim history: 59 year old female with history of paranoid schizophrenia and medication noncompliance presenting from [facility] due to hypotension, presyncope and fall. She stroke her right forehead but no LOC.  In ED, SBP in sixties.  Orthostatic vitals negative.  Platelets 125> 175.  K2.6> 3.5.  MG 1.6. Ca 5.9 (corrects to 7.1 for low albumin).  Otherwise CBC and BMP without significant finding.  UA with 5 ketones.  Hemoccult negative.  Head, cervical spine and maxillofacial CT without acute finding.  Received IV fluid boluses with improvement in her blood pressure.  Also received potassium supplementations, and admitted for overnight observation.  The next day, remained slightly hypotensive.  K2.8.  Continued on IV fluid and potassium supplementations.  Subjective: Seen and examined earlier this morning.  No major events overnight or this morning.  No complaints.  She did not eat her breakfast saying she does not feel hungry.  She denies chest pain, lightheadedness, palpitation, shortness of breath, GI or UTI symptoms.  She thinks the medications at the [facility] was causing a problem.   Objective: Vitals:   03/26/20 0230 03/26/20 0500 03/26/20 0827 03/26/20 1032  BP: (!) 82/54 95/67 (!) 113/95 (!) 83/67  Pulse: (!) 58 (!) 53 (!) 58 75  Resp: 15 18 16 17   Temp:   98.3 F (36.8 C) 98.3 F (36.8 C)  TempSrc:   Oral Oral  SpO2: 97% 100% 100% 100%  Weight:      Height:        Intake/Output Summary (Last 24 hours) at 03/26/2020 1218 Last data filed at 03/26/2020 0502 Gross per 24 hour  Intake 1050 ml  Output --  Net 1050 ml   Filed Weights   03/26/20 0005  Weight: 50.3 kg    Examination:  GENERAL: No apparent distress.  Nontoxic. HEENT: MMM.  Vision and hearing grossly intact.  NECK: Supple.  No apparent JVD.   RESP: On room air.  No IWOB.  Fair aeration bilaterally. CVS:  RRR. Heart sounds normal.  ABD/GI/GU: BS+. Abd soft, NTND.  MSK/EXT:  Moves extremities. No apparent deformity. No edema.  SKIN: no apparent skin lesion or wound NEURO: Awake, alert and oriented appropriately.  No apparent focal neuro deficit. PSYCH: Calm. Normal affect.   Procedures:  None  Microbiology summarized: COVID-19 PCR pending.  Assessment & Plan: Hypertension: Likely from dehydration due to poor p.o. intake.  Reportedly had been refusing to eat at [facility].  BP improved but SBP remains in eighties this morning.  Cortisol is on the low side of normal.  Lactic acid was normal.  No signs or symptoms of infection at this time. -Continue IV fluid hydration -Recheck a.m. cortisol with ACTH stimulation test in the morning -Check EKG and echocardiogram  Anorexia/severe malnutrition-eating disorder?  BMI 16.39.  Denies nausea, vomiting or abdominal pain.  Abdominal exam benign.  She did not eat her breakfast stating she is not hungry.  Labs with hypokalemia.  Magnesium within normal. Per H&P, she was sent here from [facility] for forced tube feeding -Not sure if negative since a good choice.  Megace would increase risk of VTE -Check prealbumin -Consult dietitian   Hypokalemia/hypomagnesemia: K2.6 on admit> 3.5> 2.8.  Hypomagnesemia resolved. -K-dur 40 mEq every 3 hours x3 -Check phosphorus level  Hypocalcemia  Fall at [facility]/presyncope-feel and stroke her forehead but no  LOC.  Cranial, cervical and maxillofacial CT without acute finding.  For likely due to hypotension -Fall precautions -PT/OT eval  Paranoid schizophrenia/insomnia: Seems stable now.  No SI, HI or audiovisual hallucination.  Concern about psych medications to take at [facility] causing her problem.  -Defer to psych medications and further recommendation to psychiatry      DVT prophylaxis:  enoxaparin (LOVENOX) injection 40 mg Start: 03/26/20  1000  Code Status: Full code Family Communication: Patient and/or RN.  Status is: Inpatient  Remains inpatient appropriate because:Hemodynamically unstable, Persistent severe electrolyte disturbances, IV treatments appropriate due to intensity of illness or inability to take PO and Inpatient level of care appropriate due to severity of illness.  Patient remains hypotensive with significant hypokalemia despite IV fluids and potassium supplementation.  Remains inpatient for close monitoring, IV fluid hydration and potassium supplementation.  She also have poor p.o. intake.    Dispo: The patient is from: [facility]              Anticipated d/c is to: [facility]              Anticipated d/c date is: 2 days              Patient currently is not medically stable to d/c.       Consultants:  Psychiatry   Sch Meds:  Scheduled Meds: . dronabinol  5 mg Oral BID AC  . enoxaparin (LOVENOX) injection  40 mg Subcutaneous Q24H  . Lumateperone Tosylate  42 mg Oral QHS  . megestrol  40 mg Oral QID  . potassium chloride  40 mEq Oral Q3H   Continuous Infusions: . 0.9 % NaCl with KCl 20 mEq / L     PRN Meds:.acetaminophen **OR** acetaminophen, traZODone  Antimicrobials: Anti-infectives (From admission, onward)   None       I have personally reviewed the following labs and images: CBC: Recent Labs  Lab 03/21/20 1719 03/25/20 1406 03/26/20 0004  WBC 6.3 4.3 5.0  NEUTROABS  --  2.6 3.0  HGB 14.6 9.6* 12.5  HCT 43.7 29.7* 38.6  MCV 84.5 88.1 87.7  PLT 213 125* 175   BMP &GFR Recent Labs  Lab 03/21/20 1719 03/21/20 1719 03/22/20 0422 03/25/20 1406 03/25/20 2215 03/25/20 2256 03/26/20 0004 03/26/20 0836  NA 138  --  141 140 142  --   --  141  K 2.4*   < > 3.0* 2.6* 2.6*  --  3.5 2.8*  CL 95*  --  101 103 112*  --   --  107  CO2 31  --  27 27 21*  --   --  24  GLUCOSE 96  --  83 85 83  --   --  108*  BUN <5*  --  <5* 5* <5*  --   --  5*  CREATININE 0.69  --  0.58 0.71 0.49  --   --   0.62  CALCIUM 9.2  --  8.0* 6.9* 5.9*  --   --  7.4*  MG  --   --   --  1.6*  --  4.0*  --  1.9   < > = values in this interval not displayed.   Estimated Creatinine Clearance: 60.1 mL/min (by C-G formula based on SCr of 0.62 mg/dL). Liver & Pancreas: Recent Labs  Lab 03/25/20 1406 03/26/20 0836  AST 16 16  ALT 7 8  ALKPHOS 39 41  BILITOT 0.4 0.3  PROT 4.8* 5.2*  ALBUMIN 2.5* 2.8*   No results for input(s): LIPASE, AMYLASE in the last 168 hours. No results for input(s): AMMONIA in the last 168 hours. Diabetic: No results for input(s): HGBA1C in the last 72 hours. Recent Labs  Lab 03/21/20 0556 03/23/20 0538 03/24/20 0607 03/25/20 0524 03/25/20 1712  GLUCAP 149* 92 97 77 94   Cardiac Enzymes: No results for input(s): CKTOTAL, CKMB, CKMBINDEX, TROPONINI in the last 168 hours. No results for input(s): PROBNP in the last 8760 hours. Coagulation Profile: No results for input(s): INR, PROTIME in the last 168 hours. Thyroid Function Tests: No results for input(s): TSH, T4TOTAL, FREET4, T3FREE, THYROIDAB in the last 72 hours. Lipid Profile: No results for input(s): CHOL, HDL, LDLCALC, TRIG, CHOLHDL, LDLDIRECT in the last 72 hours. Anemia Panel: No results for input(s): VITAMINB12, FOLATE, FERRITIN, TIBC, IRON, RETICCTPCT in the last 72 hours. Urine analysis:    Component Value Date/Time   COLORURINE STRAW (A) 03/25/2020 1608   APPEARANCEUR CLEAR 03/25/2020 1608   LABSPEC 1.004 (L) 03/25/2020 1608   PHURINE 6.0 03/25/2020 1608   GLUCOSEU NEGATIVE 03/25/2020 1608   HGBUR SMALL (A) 03/25/2020 1608   BILIRUBINUR NEGATIVE 03/25/2020 1608   KETONESUR 5 (A) 03/25/2020 1608   PROTEINUR NEGATIVE 03/25/2020 1608   UROBILINOGEN 0.2 04/10/2015 1111   NITRITE NEGATIVE 03/25/2020 1608   LEUKOCYTESUR NEGATIVE 03/25/2020 1608   Sepsis Labs: Invalid input(s): PROCALCITONIN, LACTICIDVEN  Microbiology: Recent Results (from the past 240 hour(s))  Blood culture (routine x 2)      Status: None (Preliminary result)   Collection Time: 03/25/20  2:07 PM   Specimen: BLOOD RIGHT HAND  Result Value Ref Range Status   Specimen Description   Final    BLOOD RIGHT HAND Performed at [facility], [address] [place], [place] [address]    Special Requests   Final    BOTTLES DRAWN AEROBIC AND ANAEROBIC Blood Culture adequate volume Performed at [facility], [address] [place], [place] [address]    Culture   Final    NO GROWTH < 24 HOURS Performed at [facility], [address] [place], [place] [address]    Report Status PENDING  Incomplete    Radiology Studies: CT Head Wo Contrast  Result Date: 03/25/2020 CLINICAL DATA:  Fall, laceration under RIGHT eye. EXAM: CT HEAD WITHOUT CONTRAST CT MAXILLOFACIAL WITHOUT CONTRAST CT CERVICAL SPINE WITHOUT CONTRAST TECHNIQUE: Multidetector CT imaging of the head, cervical spine, and maxillofacial structures were performed using the standard protocol without intravenous contrast. Multiplanar CT image reconstructions of the cervical spine and maxillofacial structures were also generated. COMPARISON:  None. FINDINGS: CT HEAD FINDINGS Brain: Ventricles are within normal limits in size and configuration. There is no mass, hemorrhage, edema or other evidence of acute parenchymal abnormality. No extra-axial hemorrhage. Vascular: Chronic calcified atherosclerotic changes of the large vessels at the skull base. No unexpected hyperdense vessel. Skull: Normal. Negative for fracture or focal lesion. Other: None. CT MAXILLOFACIAL FINDINGS Osseous: Lower frontal bones are intact. No displaced nasal bone fracture. Osseous structures about the orbits are intact and normally aligned bilaterally. Walls of the maxillary sinuses are intact and normally aligned. Bilateral zygomatic arches and pterygoid plates are intact. No mandible fracture or displacement is seen. Lucency/defect at the base of the next to last  RIGHT mandibular tooth, most likely periapical dental abscess, less likely acute traumatic dislodgement. Orbits: Negative. No traumatic or inflammatory finding. Sinuses: Clear. Soft tissues: Mild soft tissue edema overlying the  upper RIGHT orbit/lower frontal bone. No underlying fracture. CT CERVICAL SPINE FINDINGS Alignment: Within normal limits. No evidence of acute vertebral body subluxation. Skull base and vertebrae: No fracture line or displaced fracture fragment is seen. Soft tissues and spinal canal: No prevertebral fluid or swelling. No visible canal hematoma. Disc levels:  Disc spaces are well maintained throughout. Upper chest: Negative. Other: None. IMPRESSION: 1. No acute intracranial abnormality. No intracranial mass, hemorrhage or edema. No skull fracture. 2. No facial bone fracture or displacement. Mild soft tissue edema overlying the upper RIGHT orbit/lower frontal bone. 3. Lucency/defect at the base of the next to last RIGHT mandibular tooth, most likely periapical dental abscess/carie, less likely acute traumatic dislodgement. 4. No fracture or acute subluxation within the cervical spine. Electronically Signed   By: Bary Richard M.D.   On: 03/25/2020 15:37   CT Cervical Spine Wo Contrast  Result Date: 03/25/2020 CLINICAL DATA:  Fall, laceration under RIGHT eye. EXAM: CT HEAD WITHOUT CONTRAST CT MAXILLOFACIAL WITHOUT CONTRAST CT CERVICAL SPINE WITHOUT CONTRAST TECHNIQUE: Multidetector CT imaging of the head, cervical spine, and maxillofacial structures were performed using the standard protocol without intravenous contrast. Multiplanar CT image reconstructions of the cervical spine and maxillofacial structures were also generated. COMPARISON:  None. FINDINGS: CT HEAD FINDINGS Brain: Ventricles are within normal limits in size and configuration. There is no mass, hemorrhage, edema or other evidence of acute parenchymal abnormality. No extra-axial hemorrhage. Vascular: Chronic calcified  atherosclerotic changes of the large vessels at the skull base. No unexpected hyperdense vessel. Skull: Normal. Negative for fracture or focal lesion. Other: None. CT MAXILLOFACIAL FINDINGS Osseous: Lower frontal bones are intact. No displaced nasal bone fracture. Osseous structures about the orbits are intact and normally aligned bilaterally. Walls of the maxillary sinuses are intact and normally aligned. Bilateral zygomatic arches and pterygoid plates are intact. No mandible fracture or displacement is seen. Lucency/defect at the base of the next to last RIGHT mandibular tooth, most likely periapical dental abscess, less likely acute traumatic dislodgement. Orbits: Negative. No traumatic or inflammatory finding. Sinuses: Clear. Soft tissues: Mild soft tissue edema overlying the upper RIGHT orbit/lower frontal bone. No underlying fracture. CT CERVICAL SPINE FINDINGS Alignment: Within normal limits. No evidence of acute vertebral body subluxation. Skull base and vertebrae: No fracture line or displaced fracture fragment is seen. Soft tissues and spinal canal: No prevertebral fluid or swelling. No visible canal hematoma. Disc levels:  Disc spaces are well maintained throughout. Upper chest: Negative. Other: None. IMPRESSION: 1. No acute intracranial abnormality. No intracranial mass, hemorrhage or edema. No skull fracture. 2. No facial bone fracture or displacement. Mild soft tissue edema overlying the upper RIGHT orbit/lower frontal bone. 3. Lucency/defect at the base of the next to last RIGHT mandibular tooth, most likely periapical dental abscess/carie, less likely acute traumatic dislodgement. 4. No fracture or acute subluxation within the cervical spine. Electronically Signed   By: Bary Richard M.D.   On: 03/25/2020 15:37   CT Maxillofacial Wo Contrast  Result Date: 03/25/2020 CLINICAL DATA:  Fall, laceration under RIGHT eye. EXAM: CT HEAD WITHOUT CONTRAST CT MAXILLOFACIAL WITHOUT CONTRAST CT CERVICAL SPINE  WITHOUT CONTRAST TECHNIQUE: Multidetector CT imaging of the head, cervical spine, and maxillofacial structures were performed using the standard protocol without intravenous contrast. Multiplanar CT image reconstructions of the cervical spine and maxillofacial structures were also generated. COMPARISON:  None. FINDINGS: CT HEAD FINDINGS Brain: Ventricles are within normal limits in size and configuration. There is no mass, hemorrhage, edema or other evidence  of acute parenchymal abnormality. No extra-axial hemorrhage. Vascular: Chronic calcified atherosclerotic changes of the large vessels at the skull base. No unexpected hyperdense vessel. Skull: Normal. Negative for fracture or focal lesion. Other: None. CT MAXILLOFACIAL FINDINGS Osseous: Lower frontal bones are intact. No displaced nasal bone fracture. Osseous structures about the orbits are intact and normally aligned bilaterally. Walls of the maxillary sinuses are intact and normally aligned. Bilateral zygomatic arches and pterygoid plates are intact. No mandible fracture or displacement is seen. Lucency/defect at the base of the next to last RIGHT mandibular tooth, most likely periapical dental abscess, less likely acute traumatic dislodgement. Orbits: Negative. No traumatic or inflammatory finding. Sinuses: Clear. Soft tissues: Mild soft tissue edema overlying the upper RIGHT orbit/lower frontal bone. No underlying fracture. CT CERVICAL SPINE FINDINGS Alignment: Within normal limits. No evidence of acute vertebral body subluxation. Skull base and vertebrae: No fracture line or displaced fracture fragment is seen. Soft tissues and spinal canal: No prevertebral fluid or swelling. No visible canal hematoma. Disc levels:  Disc spaces are well maintained throughout. Upper chest: Negative. Other: None. IMPRESSION: 1. No acute intracranial abnormality. No intracranial mass, hemorrhage or edema. No skull fracture. 2. No facial bone fracture or displacement. Mild soft  tissue edema overlying the upper RIGHT orbit/lower frontal bone. 3. Lucency/defect at the base of the next to last RIGHT mandibular tooth, most likely periapical dental abscess/carie, less likely acute traumatic dislodgement. 4. No fracture or acute subluxation within the cervical spine. Electronically Signed   By: Bary Richard M.D.   On: 03/25/2020 15:37     [facility]  If 7PM-7AM, please contact night-coverage [email or web address] 03/26/2020, 12:18 PM

## 2020-03-26 NOTE — Evaluation (Signed)
Physical Therapy Evaluation Patient Details Name: [name] MRN: [id number] DOB: 22-Mar-1961 Today's Date: 03/26/2020   History of Present Illness  Patient is a 59 year old female [facility], diagnosed with hypotension. PMH includes paranoid schizophrenia   Clinical Impression  The patient ambulated on unit without dizziness, no loss ob balance.  BP: sup-96/63, post ambulation:113/95. PT will sign off. Plans to return to [name].     Follow Up Recommendations No PT follow up    Equipment Recommendations  None recommended by PT    Recommendations for Other Services       Precautions / Restrictions Precautions Precautions: None Precaution Comments: had syncopal episode PTA at [facility] Restrictions Weight Bearing Restrictions: No      Mobility  Bed Mobility Overal bed mobility: Modified Independent                Transfers Overall transfer level: Independent Equipment used: None                Ambulation/Gait Ambulation/Gait assistance: Independent Gait Distance (Feet): 220 Feet Assistive device: IV Pole;None Gait Pattern/deviations: Step-through pattern        Stairs            Wheelchair Mobility    Modified Rankin (Stroke Patients Only)       Balance Overall balance assessment: No apparent balance deficits (not formally assessed)                                           Pertinent Vitals/Pain Pain Assessment: No/denies pain    Home Living Family/patient expects to be discharged to:: Private residence Living Arrangements: Other relatives (grandmother) Available Help at Discharge: Family Type of Home: House Home Access: Stairs to enter Entrance Stairs-Rails: Doctor, general practice of Steps: 3 Home Layout: One level Home Equipment: None      Prior Function Level of Independence: Independent               Hand Dominance   Dominant Hand: Right    Extremity/Trunk  Assessment   Upper Extremity Assessment Upper Extremity Assessment: Overall WFL for tasks assessed    Lower Extremity Assessment Lower Extremity Assessment: Overall WFL for tasks assessed    Cervical / Trunk Assessment Cervical / Trunk Assessment: Normal  Communication   Communication: No difficulties  Cognition Arousal/Alertness: Awake/alert Behavior During Therapy: Flat affect Overall Cognitive Status: Within Functional Limits for tasks assessed                                 General Comments: following directions appropriately      General Comments General comments (skin integrity, edema, etc.): VSS, denies any dizziness throughout session.    Exercises     Assessment/Plan    PT Assessment Patent does not need any further PT services  PT Problem List Decreased mobility       PT Treatment Interventions      PT Goals (Current goals can be found in the Care Plan section)  Acute Rehab PT Goals Patient Stated Goal: home PT Goal Formulation: All assessment and education complete, DC therapy    Frequency     Barriers to discharge        Co-evaluation  AM-PAC PT "6 Clicks" Mobility  Outcome Measure Help needed turning from your back to your side while in a flat bed without using bedrails?: None Help needed moving from lying on your back to sitting on the side of a flat bed without using bedrails?: None Help needed moving to and from a bed to a chair (including a wheelchair)?: None Help needed standing up from a chair using your arms (e.g., wheelchair or bedside chair)?: None Help needed to walk in hospital room?: None Help needed climbing 3-5 steps with a railing? : None 6 Click Score: 24    End of Session   Activity Tolerance: Patient tolerated treatment well Patient left: in bed;with call bell/phone within reach Nurse Communication: Mobility status PT Visit Diagnosis: Difficulty in walking, not elsewhere classified  (R26.2)    Time: 0812-0823 PT Time Calculation (min) (ACUTE ONLY): 11 min   Charges:   PT Evaluation $PT Eval Low Complexity: 1 Low          =Ricardo Schubach [facility] PT Acute Rehabilitation Services Pager [phone] Office [phone]   Rada Hay 03/26/2020, 10:11 AM

## 2020-03-26 NOTE — ED Triage Notes (Signed)
Per [facility] RN, sent patient over-states she is hypotensive with a systolic pressure of 66-did not take manual pressure-states patient is not eating or drinking. Patient is being admitted.

## 2020-03-26 NOTE — Progress Notes (Signed)
Occupational Therapy Evaluation  Patient at baseline with functional mobility and and self care, ambulated in hallway without AD no loss of balance noted. Pt reports using bathroom earlier without physical assist other than supervision for IV pole. Patient denied any dizziness throughout evaluation with VSS. Will sign off acute OT, please re-consult if new needs arise.    03/26/20 0900  OT Visit Information  Last OT Received On 03/26/20  Assistance Needed +1  History of Present Illness Patient is a 59 year old female [facility], diagnosed with hypotension. PMH includes paranoid schizophrenia   Precautions  Precautions None  Restrictions  Weight Bearing Restrictions No  Home Living  Family/patient expects to be discharged to: Private residence  Living Arrangements Other relatives (grandmother)  Available Help at Discharge Family  Type of Home House  Home Access Stairs to enter  Entrance Stairs-Number of Steps 3  Entrance Stairs-Rails Right;Left  Home Layout One level  Bathroom Shower/Tub Tub/shower unit  Home Equipment None  Prior Function  Level of Independence Independent  Communication  Communication No difficulties  Pain Assessment  Pain Assessment No/denies pain  Cognition  Arousal/Alertness Awake/alert  Behavior During Therapy Flat affect  Overall Cognitive Status Within Functional Limits for tasks assessed  General Comments following directions appropriately  Upper Extremity Assessment  Upper Extremity Assessment Overall WFL for tasks assessed  Lower Extremity Assessment  Lower Extremity Assessment Defer to PT evaluation  Cervical / Trunk Assessment  Cervical / Trunk Assessment Normal  ADL  Overall ADL's  Independent  General ADL Comments patient ambulate in hallway without difficulty, reports using bathroom earlier without physical assistance just supervision for IV pole management  Bed Mobility  Overal bed mobility Modified  Independent  Transfers  Overall transfer level Independent  Equipment used None  Balance  Overall balance assessment No apparent balance deficits (not formally assessed)  General Comments  General comments (skin integrity, edema, etc.) VSS, denies any dizziness throughout session.  OT - End of Session  Activity Tolerance Patient tolerated treatment well  Patient left in bed;with call bell/phone within reach  OT Assessment  OT Recommendation/Assessment Patient does not need any further OT services  OT Visit Diagnosis History of falling (Z91.81)  OT Problem List Decreased activity tolerance  AM-PAC OT "6 Clicks" Daily Activity Outcome Measure (Version 2)  Help from another person eating meals? 4  Help from another person taking care of personal grooming? 4  Help from another person toileting, which includes using toliet, bedpan, or urinal? 4  Help from another person bathing (including washing, rinsing, drying)? 4  Help from another person to put on and taking off regular upper body clothing? 4  Help from another person to put on and taking off regular lower body clothing? 4  6 Click Score 24  OT Recommendation  Follow Up Recommendations No OT follow up;Other (comment) (return BH)  OT Equipment None recommended by OT  OT Time Calculation  OT Start Time (ACUTE ONLY) [id number]  OT Stop Time (ACUTE ONLY) 0820  OT Time Calculation (min) 13 min  OT General Charges  $OT Visit 1 Visit  OT Evaluation  $OT Eval Low Complexity 1 Low  Written Expression  Dominant Hand Right   Marlyce Huge OT OT pager: [phone]

## 2020-03-26 NOTE — Progress Notes (Signed)
Please call Earley Abide @ [phone] for report

## 2020-03-26 NOTE — ED Provider Notes (Signed)
Care assumed from [facility], PA-C. See her note for full H&P.   Per her note, "Paula Alstonis a 59 y.o.femalewithpast medical history significant for medication noncompliance, paranoid schizophrenia who presents for evaluation of fall and hypotension. Patient states she has been drinking oral liquids or has had decreased p.o. appetite due to her "limited diet." She denies SI, HI, AVH. She arrived from behavioral health under IVC for paranoid behavior. Was apparently seen here 8/26/21for decreased p.o. intake and was admitted for persistent hypoglycemia requiring IV dextrose. She was discharged back to behavioral health. Patient has had persistent low blood pressures with systolic into the 70s. Patient states earlier today she became "disoriented and lightheaded." This caused her to fall hitting right orbital area. She denies any current pain. She denies headache, lightheadedness, dizziness, facial pain, vision changes, neck pain, neck stiffness, chest pain, shortness of breath abdominal pain, diarrhea, dysuria, weakness, paresthesias,melenaorbright red blood per rectum. She denies additional aggravating relieving factors. She does have 1 cm superficial laceration to right zygomatic however no bleeding, drainage. She is noted to be hypotensive into the 70s here at [facility] MAP less than 65. She appears otherwise well. BP here in the 90's systolic.  History obtained from patientandpast medical records. No interpretor was used."   Physical Exam  BP (!) 87/54   Pulse 66   Resp 14   Ht  (1.753 m)   Wt 50.3 kg   SpO2 100%   BMI 16.39 kg/m   Physical Exam Vitals and nursing note reviewed.  Constitutional:      General: She is not in acute distress.    Appearance: She is well-developed.  HENT:     Head: Normocephalic and atraumatic.     Mouth/Throat:     Mouth: Mucous membranes are dry.     Comments: Lips dry Eyes:     Conjunctiva/sclera: Conjunctivae normal.      Comments: Pale conjunctiva  Cardiovascular:     Rate and Rhythm: Normal rate and regular rhythm.  Pulmonary:     Effort: Pulmonary effort is normal.  Abdominal:     General: Bowel sounds are normal.     Palpations: Abdomen is soft.     Tenderness: There is no abdominal tenderness. There is no guarding or rebound.  Genitourinary:    Comments: DRE performed. Light brown stool noted in rectal vault.  Musculoskeletal:     Cervical back: Neck supple.  Skin:    General: Skin is warm and dry.  Neurological:     Mental Status: She is alert.       ED Course/Procedures     Procedures  Results for orders placed or performed during the hospital encounter of 03/15/20  Glucose, capillary  Result Value Ref Range   Glucose-Capillary 106 (H) 70 - 99 mg/dL  Glucose, capillary  Result Value Ref Range   Glucose-Capillary 94 70 - 99 mg/dL  Glucose, capillary  Result Value Ref Range   Glucose-Capillary 91 70 - 99 mg/dL  Glucose, capillary  Result Value Ref Range   Glucose-Capillary 81 70 - 99 mg/dL  Glucose, capillary  Result Value Ref Range   Glucose-Capillary 82 70 - 99 mg/dL  Glucose, capillary  Result Value Ref Range   Glucose-Capillary 149 (H) 70 - 99 mg/dL  Basic metabolic panel  Result Value Ref Range   Sodium 138 135 - 145 mmol/L   Potassium 2.4 (LL) 3.5 - 5.1 mmol/L   Chloride 95 (L) 98 - 111 mmol/L   CO2 31  22 - 32 mmol/L   Glucose, Bld 96 70 - 99 mg/dL   BUN <5 (L) 6 - 20 mg/dL   Creatinine, Ser 1.30 0.44 - 1.00 mg/dL   Calcium 9.2 8.9 - 86.5 mg/dL   GFR calc non Af Amer >60 >60 mL/min   GFR calc Af Amer >60 >60 mL/min   Anion gap 12 5 - 15  CBC  Result Value Ref Range   WBC 6.3 4.0 - 10.5 K/uL   RBC 5.17 (H) 3.87 - 5.11 MIL/uL   Hemoglobin 14.6 12.0 - 15.0 g/dL   HCT 78.4 36 - 46 %   MCV 84.5 80.0 - 100.0 fL   MCH 28.2 26.0 - 34.0 pg   MCHC 33.4 30.0 - 36.0 g/dL   RDW 69.6 29.5 - 28.4 %   Platelets 213 150 - 400 K/uL   nRBC 0.00 0.0 - 0.2 %  Basic  metabolic panel  Result Value Ref Range   Sodium 141 135 - 145 mmol/L   Potassium 3.0 (L) 3.5 - 5.1 mmol/L   Chloride 101 98 - 111 mmol/L   CO2 27 22 - 32 mmol/L   Glucose, Bld 83 70 - 99 mg/dL   BUN <5 (L) 6 - 20 mg/dL   Creatinine, Ser 1.32 0.44 - 1.00 mg/dL   Calcium 8.0 (L) 8.9 - 10.3 mg/dL   GFR calc non Af Amer >60 >60 mL/min   GFR calc Af Amer >60 >60 mL/min   Anion gap 13 5 - 15  Glucose, capillary  Result Value Ref Range   Glucose-Capillary 92 70 - 99 mg/dL  Glucose, capillary  Result Value Ref Range   Glucose-Capillary 97 70 - 99 mg/dL  Glucose, capillary  Result Value Ref Range   Glucose-Capillary 77 70 - 99 mg/dL  CBC with Differential  Result Value Ref Range   WBC 4.3 4.0 - 10.5 K/uL   RBC 3.37 (L) 3.87 - 5.11 MIL/uL   Hemoglobin 9.6 (L) 12.0 - 15.0 g/dL   HCT 44.0 (L) 36 - 46 %   MCV 88.1 80.0 - 100.0 fL   MCH 28.5 26.0 - 34.0 pg   MCHC 32.3 30.0 - 36.0 g/dL   RDW 10.2 72.5 - 36.6 %   Platelets 125 (L) 150 - 400 K/uL   nRBC 0.0 0.0 - 0.2 %   Neutrophils Relative % 61 %   Neutro Abs 2.6 1.7 - 7.7 K/uL   Lymphocytes Relative 27 %   Lymphs Abs 1.2 0.7 - 4.0 K/uL   Monocytes Relative 10 %   Monocytes Absolute 0.4 0 - 1 K/uL   Eosinophils Relative 2 %   Eosinophils Absolute 0.1 0 - 0 K/uL   Basophils Relative 0 %   Basophils Absolute 0.0 0 - 0 K/uL   Immature Granulocytes 0 %   Abs Immature Granulocytes 0.01 0.00 - 0.07 K/uL  Comprehensive metabolic panel  Result Value Ref Range   Sodium 140 135 - 145 mmol/L   Potassium 2.6 (LL) 3.5 - 5.1 mmol/L   Chloride 103 98 - 111 mmol/L   CO2 27 22 - 32 mmol/L   Glucose, Bld 85 70 - 99 mg/dL   BUN 5 (L) 6 - 20 mg/dL   Creatinine, Ser 4.40 0.44 - 1.00 mg/dL   Calcium 6.9 (L) 8.9 - 10.3 mg/dL   Total Protein 4.8 (L) 6.5 - 8.1 g/dL   Albumin 2.5 (L) 3.5 - 5.0 g/dL   AST 16 15 - 41 U/L  ALT 7 0 - 44 U/L   Alkaline Phosphatase 39 38 - 126 U/L   Total Bilirubin 0.4 0.3 - 1.2 mg/dL   GFR calc non Af Amer >60 >60  mL/min   GFR calc Af Amer >60 >60 mL/min   Anion gap 10 5 - 15  Lactic acid, plasma  Result Value Ref Range   Lactic Acid, Venous 1.6 0.5 - 1.9 mmol/L  Magnesium  Result Value Ref Range   Magnesium 1.6 (L) 1.7 - 2.4 mg/dL  Urinalysis, Routine w reflex microscopic Urine, Clean Catch  Result Value Ref Range   Color, Urine STRAW (A) YELLOW   APPearance CLEAR CLEAR   Specific Gravity, Urine 1.004 (L) 1.005 - 1.030   pH 6.0 5.0 - 8.0   Glucose, UA NEGATIVE NEGATIVE mg/dL   Hgb urine dipstick SMALL (A) NEGATIVE   Bilirubin Urine NEGATIVE NEGATIVE   Ketones, ur 5 (A) NEGATIVE mg/dL   Protein, ur NEGATIVE NEGATIVE mg/dL   Nitrite NEGATIVE NEGATIVE   Leukocytes,Ua NEGATIVE NEGATIVE   RBC / HPF 0-5 0 - 5 RBC/hpf   WBC, UA 0-5 0 - 5 WBC/hpf   Bacteria, UA NONE SEEN NONE SEEN   Squamous Epithelial / LPF 0-5 0 - 5   Mucus PRESENT   Basic metabolic panel  Result Value Ref Range   Sodium 142 135 - 145 mmol/L   Potassium 2.6 (LL) 3.5 - 5.1 mmol/L   Chloride 112 (H) 98 - 111 mmol/L   CO2 21 (L) 22 - 32 mmol/L   Glucose, Bld 83 70 - 99 mg/dL   BUN <5 (L) 6 - 20 mg/dL   Creatinine, Ser 1.610.49 0.44 - 1.00 mg/dL   Calcium 5.9 (LL) 8.9 - 10.3 mg/dL   GFR calc non Af Amer >60 >60 mL/min   GFR calc Af Amer >60 >60 mL/min   Anion gap 9 5 - 15  Magnesium  Result Value Ref Range   Magnesium 4.0 (H) 1.7 - 2.4 mg/dL  CBC with Differential/Platelet  Result Value Ref Range   WBC 5.0 4.0 - 10.5 K/uL   RBC 4.40 3.87 - 5.11 MIL/uL   Hemoglobin 12.5 12.0 - 15.0 g/dL   HCT 09.638.6 36 - 46 %   MCV 87.7 80.0 - 100.0 fL   MCH 28.4 26.0 - 34.0 pg   MCHC 32.4 30.0 - 36.0 g/dL   RDW 04.512.6 40.911.5 - 81.115.5 %   Platelets 175 150 - 400 K/uL   nRBC 0.0 0.0 - 0.2 %   Neutrophils Relative % 61 %   Neutro Abs 3.0 1.7 - 7.7 K/uL   Lymphocytes Relative 28 %   Lymphs Abs 1.4 0.7 - 4.0 K/uL   Monocytes Relative 9 %   Monocytes Absolute 0.5 0 - 1 K/uL   Eosinophils Relative 2 %   Eosinophils Absolute 0.1 0 - 0 K/uL    Basophils Relative 0 %   Basophils Absolute 0.0 0 - 0 K/uL   Immature Granulocytes 0 %   Abs Immature Granulocytes 0.00 0.00 - 0.07 K/uL  Potassium  Result Value Ref Range   Potassium 3.5 3.5 - 5.1 mmol/L  I-Stat beta hCG blood, ED  Result Value Ref Range   I-stat hCG, quantitative <5.0 <5 mIU/mL   Comment 3          POC CBG, ED  Result Value Ref Range   Glucose-Capillary 94 70 - 99 mg/dL  POC occult blood, ED Provider will collect  Result Value Ref Range  Fecal Occult Bld NEGATIVE NEGATIVE  Troponin I (High Sensitivity)  Result Value Ref Range   Troponin I (High Sensitivity) 5 <18 ng/L  Troponin I (High Sensitivity)  Result Value Ref Range   Troponin I (High Sensitivity) 4 <18 ng/L   CT Head Wo Contrast  Result Date: 03/25/2020 CLINICAL DATA:  Fall, laceration under RIGHT eye. EXAM: CT HEAD WITHOUT CONTRAST CT MAXILLOFACIAL WITHOUT CONTRAST CT CERVICAL SPINE WITHOUT CONTRAST TECHNIQUE: Multidetector CT imaging of the head, cervical spine, and maxillofacial structures were performed using the standard protocol without intravenous contrast. Multiplanar CT image reconstructions of the cervical spine and maxillofacial structures were also generated. COMPARISON:  None. FINDINGS: CT HEAD FINDINGS Brain: Ventricles are within normal limits in size and configuration. There is no mass, hemorrhage, edema or other evidence of acute parenchymal abnormality. No extra-axial hemorrhage. Vascular: Chronic calcified atherosclerotic changes of the large vessels at the skull base. No unexpected hyperdense vessel. Skull: Normal. Negative for fracture or focal lesion. Other: None. CT MAXILLOFACIAL FINDINGS Osseous: Lower frontal bones are intact. No displaced nasal bone fracture. Osseous structures about the orbits are intact and normally aligned bilaterally. Walls of the maxillary sinuses are intact and normally aligned. Bilateral zygomatic arches and pterygoid plates are intact. No mandible fracture or  displacement is seen. Lucency/defect at the base of the next to last RIGHT mandibular tooth, most likely periapical dental abscess, less likely acute traumatic dislodgement. Orbits: Negative. No traumatic or inflammatory finding. Sinuses: Clear. Soft tissues: Mild soft tissue edema overlying the upper RIGHT orbit/lower frontal bone. No underlying fracture. CT CERVICAL SPINE FINDINGS Alignment: Within normal limits. No evidence of acute vertebral body subluxation. Skull base and vertebrae: No fracture line or displaced fracture fragment is seen. Soft tissues and spinal canal: No prevertebral fluid or swelling. No visible canal hematoma. Disc levels:  Disc spaces are well maintained throughout. Upper chest: Negative. Other: None. IMPRESSION: 1. No acute intracranial abnormality. No intracranial mass, hemorrhage or edema. No skull fracture. 2. No facial bone fracture or displacement. Mild soft tissue edema overlying the upper RIGHT orbit/lower frontal bone. 3. Lucency/defect at the base of the next to last RIGHT mandibular tooth, most likely periapical dental abscess/carie, less likely acute traumatic dislodgement. 4. No fracture or acute subluxation within the cervical spine. Electronically Signed   By: Bary Richard M.D.   On: 03/25/2020 15:37   CT Cervical Spine Wo Contrast  Result Date: 03/25/2020 CLINICAL DATA:  Fall, laceration under RIGHT eye. EXAM: CT HEAD WITHOUT CONTRAST CT MAXILLOFACIAL WITHOUT CONTRAST CT CERVICAL SPINE WITHOUT CONTRAST TECHNIQUE: Multidetector CT imaging of the head, cervical spine, and maxillofacial structures were performed using the standard protocol without intravenous contrast. Multiplanar CT image reconstructions of the cervical spine and maxillofacial structures were also generated. COMPARISON:  None. FINDINGS: CT HEAD FINDINGS Brain: Ventricles are within normal limits in size and configuration. There is no mass, hemorrhage, edema or other evidence of acute parenchymal  abnormality. No extra-axial hemorrhage. Vascular: Chronic calcified atherosclerotic changes of the large vessels at the skull base. No unexpected hyperdense vessel. Skull: Normal. Negative for fracture or focal lesion. Other: None. CT MAXILLOFACIAL FINDINGS Osseous: Lower frontal bones are intact. No displaced nasal bone fracture. Osseous structures about the orbits are intact and normally aligned bilaterally. Walls of the maxillary sinuses are intact and normally aligned. Bilateral zygomatic arches and pterygoid plates are intact. No mandible fracture or displacement is seen. Lucency/defect at the base of the next to last RIGHT mandibular tooth, most likely periapical dental abscess,  less likely acute traumatic dislodgement. Orbits: Negative. No traumatic or inflammatory finding. Sinuses: Clear. Soft tissues: Mild soft tissue edema overlying the upper RIGHT orbit/lower frontal bone. No underlying fracture. CT CERVICAL SPINE FINDINGS Alignment: Within normal limits. No evidence of acute vertebral body subluxation. Skull base and vertebrae: No fracture line or displaced fracture fragment is seen. Soft tissues and spinal canal: No prevertebral fluid or swelling. No visible canal hematoma. Disc levels:  Disc spaces are well maintained throughout. Upper chest: Negative. Other: None. IMPRESSION: 1. No acute intracranial abnormality. No intracranial mass, hemorrhage or edema. No skull fracture. 2. No facial bone fracture or displacement. Mild soft tissue edema overlying the upper RIGHT orbit/lower frontal bone. 3. Lucency/defect at the base of the next to last RIGHT mandibular tooth, most likely periapical dental abscess/carie, less likely acute traumatic dislodgement. 4. No fracture or acute subluxation within the cervical spine. Electronically Signed   By: Bary Richard M.D.   On: 03/25/2020 15:37   CT Maxillofacial Wo Contrast  Result Date: 03/25/2020 CLINICAL DATA:  Fall, laceration under RIGHT eye. EXAM: CT HEAD  WITHOUT CONTRAST CT MAXILLOFACIAL WITHOUT CONTRAST CT CERVICAL SPINE WITHOUT CONTRAST TECHNIQUE: Multidetector CT imaging of the head, cervical spine, and maxillofacial structures were performed using the standard protocol without intravenous contrast. Multiplanar CT image reconstructions of the cervical spine and maxillofacial structures were also generated. COMPARISON:  None. FINDINGS: CT HEAD FINDINGS Brain: Ventricles are within normal limits in size and configuration. There is no mass, hemorrhage, edema or other evidence of acute parenchymal abnormality. No extra-axial hemorrhage. Vascular: Chronic calcified atherosclerotic changes of the large vessels at the skull base. No unexpected hyperdense vessel. Skull: Normal. Negative for fracture or focal lesion. Other: None. CT MAXILLOFACIAL FINDINGS Osseous: Lower frontal bones are intact. No displaced nasal bone fracture. Osseous structures about the orbits are intact and normally aligned bilaterally. Walls of the maxillary sinuses are intact and normally aligned. Bilateral zygomatic arches and pterygoid plates are intact. No mandible fracture or displacement is seen. Lucency/defect at the base of the next to last RIGHT mandibular tooth, most likely periapical dental abscess, less likely acute traumatic dislodgement. Orbits: Negative. No traumatic or inflammatory finding. Sinuses: Clear. Soft tissues: Mild soft tissue edema overlying the upper RIGHT orbit/lower frontal bone. No underlying fracture. CT CERVICAL SPINE FINDINGS Alignment: Within normal limits. No evidence of acute vertebral body subluxation. Skull base and vertebrae: No fracture line or displaced fracture fragment is seen. Soft tissues and spinal canal: No prevertebral fluid or swelling. No visible canal hematoma. Disc levels:  Disc spaces are well maintained throughout. Upper chest: Negative. Other: None. IMPRESSION: 1. No acute intracranial abnormality. No intracranial mass, hemorrhage or edema. No  skull fracture. 2. No facial bone fracture or displacement. Mild soft tissue edema overlying the upper RIGHT orbit/lower frontal bone. 3. Lucency/defect at the base of the next to last RIGHT mandibular tooth, most likely periapical dental abscess/carie, less likely acute traumatic dislodgement. 4. No fracture or acute subluxation within the cervical spine. Electronically Signed   By: Bary Richard M.D.   On: 03/25/2020 15:37   CRITICAL CARE Performed by: Karrie Meres   Total critical care time: 33 minutes  Critical care time was exclusive of separately billable procedures and treating other patients.  Critical care was necessary to treat or prevent imminent or life-threatening deterioration.  Critical care was time spent personally by me on the following activities: development of treatment plan with patient and/or surrogate as well as nursing, discussions with consultants,  evaluation of patient's response to treatment, examination of patient, obtaining history from patient or surrogate, ordering and performing treatments and interventions, ordering and review of laboratory studies, ordering and review of radiographic studies, pulse oximetry and re-evaluation of patient's condition.    MDM    59 year old female presenting for evaluation of hypotension and decreased p.o. intake.  Patient with history of paranoid delusions, has been refusing to eat or drink at [facility]. Was noted to be hypotensive at [facility] so was sent her for further eval.   CBC showed no leukocytosis, did show drop in hemoglobin  -Hemoccult was negative, denies vaginal bleeding CMP initially with potassium of 2.6, creatinine and LFTs are normal  - po and iv k given in the ED. No noted EKG changes Mg added and was low  - iv mag given Lactic acid negative UA neg for UTI  Initial labs revealed hypokalemia and hypomagnesemia which were both supplemented in the ED.  Repeat being that revealed persistent hypokalemia despite  supplementation.  She also is intermittently hypotensive and appears to be significantly dehydrated.  Will consult hospitalist service for further treatment.   11:36 PM CONSULT with Dr. Loney Loh with hospitalist service who accepts patient for admission     Karrie Meres, PA-C 03/26/20 0225    Paula Norfolk, DO 03/26/20 1651

## 2020-03-26 NOTE — Consult Note (Signed)
  Psych consult placed for patient who was sent to the hospital for hypotension, presyncope and fall. This is her fifth ER visit in 1 month since being admitted [facility].  Patient with a history of schizophrenia, weight loss, refusal to eat secondary to paranoia.  From last note patient received Haldol deck on August 24, was subsequently transferred to the emergency room for hypoglycemia and was then sent back to [facility] behavioral health to finish ongoing treatment.  It appears that patient does not eat well and behavioral health hospital, however eats when she gets to the emergency room.  The reason for this remains unclear however she has been started on Megace to help increase her appetite.  SHe is to continue her current medications once she is stable to transfer back to [facility]. Psych team has been working closely to resolve her paranoia and psychosis. She has been limiting her intake of oral foods and drinks (due to her psychosis).  -Her current medicine regimen is as follows Caplyta 42mg ,Hydroxyzine 25mg  po TID prn, Trazadone 50mg  po qhs. Once she is stable the plan is to transfer her back to [facility].  -Psych consult not warranted, meds have been re-ordered.  Did review findings with Dr. , who believes patient may benefit from ECT as she continues to refuse not to eat, medication treatment failure and ongoing paranoia.  Provider has reached out to Dr. to see if patient is a good candidate for ECT at [facility].  If determined patient is not appropriate for ECT at this time may consider referral to [facility] as patient has been inpatient for almost 30 days with no improvement in symptoms and continues to be unstable. -VSS are stable at this time,. Will continue to monitor.

## 2020-03-27 ENCOUNTER — Telehealth: Payer: Self-pay | Admitting: Emergency Medicine

## 2020-03-27 ENCOUNTER — Observation Stay (HOSPITAL_BASED_OUTPATIENT_CLINIC_OR_DEPARTMENT_OTHER): Payer: Medicare Other

## 2020-03-27 DIAGNOSIS — Y92009 Unspecified place in unspecified non-institutional (private) residence as the place of occurrence of the external cause: Secondary | ICD-10-CM

## 2020-03-27 DIAGNOSIS — R55 Syncope and collapse: Secondary | ICD-10-CM

## 2020-03-27 DIAGNOSIS — E43 Unspecified severe protein-calorie malnutrition: Secondary | ICD-10-CM | POA: Diagnosis present

## 2020-03-27 DIAGNOSIS — E440 Moderate protein-calorie malnutrition: Secondary | ICD-10-CM

## 2020-03-27 LAB — RENAL FUNCTION PANEL
Albumin: 3.1 g/dL — ABNORMAL LOW (ref 3.5–5.0)
Anion gap: 10 (ref 5–15)
BUN: <5 mg/dL — ABNORMAL LOW (ref 6–20)
CO2: 26 mmol/L (ref 22–32)
Calcium: 8.7 mg/dL — ABNORMAL LOW (ref 8.9–10.3)
Chloride: 106 mmol/L (ref 98–111)
Creatinine, Ser: 0.55 mg/dL (ref 0.44–1.00)
GFR calc Af Amer: >60 mL/min (ref >60)
GFR calc non Af Amer: >60 mL/min (ref >60)
Glucose, Bld: 97 mg/dL (ref 70–99)
Phosphorus: 3.1 mg/dL (ref 2.5–4.6)
Potassium: 3.6 mmol/L (ref 3.5–5.1)
Sodium: 142 mmol/L (ref 135–145)

## 2020-03-27 LAB — ECHOCARDIOGRAM COMPLETE
Area-P 1/2: 2.5 cm2
Height: 69 in
S' Lateral: 2.3 cm
Weight: 1775.99 oz

## 2020-03-27 LAB — CALCIUM, IONIZED: Calcium, Ionized, Serum: 3.3 mg/dL — ABNORMAL LOW (ref 4.5–5.6)

## 2020-03-27 LAB — ACTH STIMULATION, 3 TIME POINTS
Cortisol, 30 Min: 20.5 ug/dL
Cortisol, Base: 7.7 ug/dL

## 2020-03-27 LAB — CORTISOL-AM, BLOOD: Cortisol - AM: 7.3 ug/dL (ref 6.7–22.6)

## 2020-03-27 LAB — MAGNESIUM: Magnesium: 2 mg/dL (ref 1.7–2.4)

## 2020-03-27 MED ORDER — HYDROCORTISONE 10 MG PO TABS
10.0000 mg | ORAL_TABLET | Freq: Every day | ORAL | Status: DC
  Administered 2020-03-28: 10 mg via ORAL
  Filled 2020-03-27: qty 1

## 2020-03-27 MED ORDER — HYDROCORTISONE 10 MG PO TABS: 10.0000 mg | ORAL_TABLET | Freq: Every evening | ORAL | Status: DC

## 2020-03-27 MED ORDER — BOOST / RESOURCE BREEZE PO LIQD CUSTOM: 1.0000 | ORAL | Status: DC

## 2020-03-27 MED ORDER — KATE FARMS STANDARD 1.4 PO LIQD
325.0000 mL | Freq: Every day | ORAL | Status: DC
  Administered 2020-03-28 – 2020-04-01 (×4): 325 mL via ORAL
  Filled 2020-03-27 (×6): qty 325

## 2020-03-27 MED ORDER — HYDROCORTISONE 5 MG PO TABS
5.0000 mg | ORAL_TABLET | Freq: Every evening | ORAL | Status: DC
  Administered 2020-03-27: 5 mg via ORAL
  Filled 2020-03-27 (×2): qty 1

## 2020-03-27 MED ORDER — BOOST / RESOURCE BREEZE PO LIQD CUSTOM
1.0000 | Freq: Three times a day (TID) | ORAL | Status: DC
  Administered 2020-03-27 – 2020-04-01 (×17): 1 via ORAL

## 2020-03-27 MED ORDER — HYDROCORTISONE 5 MG PO TABS: 5.0000 mg | ORAL_TABLET | Freq: Every evening | ORAL | Status: DC

## 2020-03-27 MED ORDER — ENSURE ENLIVE PO LIQD: 237.0000 mL | Freq: Two times a day (BID) | ORAL | Status: DC

## 2020-03-27 MED ORDER — POTASSIUM CHLORIDE CRYS ER 20 MEQ PO TBCR
40.0000 meq | EXTENDED_RELEASE_TABLET | Freq: Once | ORAL | Status: AC
  Administered 2020-03-27: 40 meq via ORAL
  Filled 2020-03-27: qty 2

## 2020-03-27 NOTE — Telephone Encounter (Signed)
Post ED Visit - Positive Culture Follow-up  Culture report reviewed by antimicrobial stewardship pharmacist: [name] []  , Pharm.D. []  Enzo Bi, Pharm.D., BCPS AQ-ID []  , Pharm.D., BCPS []  Celedonio Miyamoto, Pharm.D., BCPS []  Palmer, Garvin Fila.D., BCPS, AAHIVP []  , Pharm.D., BCPS, AAHIVP []  Georgina Pillion, PharmD, BCPS []  , PharmD, BCPS []  [place], PharmD, BCPS []  [place], PharmD []  , PharmD, BCPS []  Estella Husk, PharmD  Pharmacy Team []  Lysle Pearl, PharmD []  , PharmD []  Phillips Climes, PharmD []  , Rph []  Agapito Games) , PharmD []  Verlan Friends, PharmD []  , PharmD []  Mervyn Gay, PharmD []  , PharmD [x]  Vinnie Level, PharmD []  Wonda Olds, PharmD []  , PharmD []  Len Childs, PharmD   Positive urine culture Treated with none, asymptomatic, currently an inpatient, no further patient follow-up is required at this time.  03/27/2020, 11:10 AM

## 2020-03-27 NOTE — Progress Notes (Signed)
PROGRESS NOTE  [name] WFU:[id number] DOB: [birth year]   PCP: [facility]  Patient is from: [name]  DOA: 03/25/2020 LOS: 1  Brief Narrative / Interim history: 59 year old female with history of paranoid schizophrenia and medication noncompliance presenting from [facility] due to hypotension, presyncope and fall. She stroke her right forehead but no LOC.  In ED, SBP in sixties.  Orthostatic vitals negative.  Platelets 125> 175.  K2.6> 3.5.  MG 1.6. Ca 5.9 (corrects to 7.1 for low albumin).  Otherwise CBC and BMP without significant finding.  Initial EKG with ectopic rhythm, low voltage QRS and nonspecific T wave changes.  UA with 5 ketones.  Hemoccult negative.  Head, cervical spine and maxillofacial CT without acute finding.  Received IV fluid boluses with improvement in her blood pressure.  Also received potassium supplementations, and admitted for overnight observation.  The next day, remained slightly hypotensive.  K2.8.  Continued on IV fluid and potassium supplementations. Eventually, hypokalemia resolved.  No events on telemetry.  Repeat EKG normal sinus rhythm with low voltage QRS.  Echocardiogram without significant finding.  Subjective: Seen and examined earlier this morning.  No major events overnight or this morning.  No complaints.  She denies chest pain, dyspnea, palpitation, GI or UTI symptoms.  She denies SI, HI or audiovisual hallucination.  She says she did not eat dinner because they did not bring it to her last night.  She she has not ordered breakfast yet.   Objective: Vitals:   03/26/20 2124 03/27/20 0623 03/27/20 0700 03/27/20 1217  BP: (!) 115/55 113/73 (!) 152/87 (!) 97/57  Pulse: 66 (!) 56 80 81  Resp: 18 20    Temp: 98.3 F (36.8 C) 98 F (36.7 C) 98.3 F (36.8 C) 98 F (36.7 C)  TempSrc: Oral Oral Oral Oral  SpO2: 100% 100% 100% 100%  Weight:      Height:        Intake/Output Summary (Last 24 hours) at 03/27/2020 1404 Last data  filed at 03/27/2020 1234 Gross per 24 hour  Intake 1296.17 ml  Output --  Net 1296.17 ml   Filed Weights   03/26/20 0005  Weight: 50.3 kg    Examination:  GENERAL: No apparent distress.  Nontoxic. HEENT: MMM.  Vision and hearing grossly intact.  NECK: Supple.  No apparent JVD.  RESP:  No IWOB.  Fair aeration bilaterally. CVS:  RRR. Heart sounds normal.  ABD/GI/GU: BS+. Abd soft, NTND.  MSK/EXT:  Moves extremities. No apparent deformity. No edema.  SKIN: no apparent skin lesion or wound NEURO: Awake, alert and oriented appropriately.  No apparent focal neuro deficit. PSYCH: Calm. Normal affect.  Denies SI/HI/AVD  Procedures:  None  Microbiology summarized: COVID-19 PCR pending.  Assessment & Plan: Hypotension: Likely from dehydration due to poor p.o. intake.  Reportedly had been refusing to eat at [facility].  Initial EKG with ectopy but regular rhythm with wide low voltage QRS and nonspecific T wave changes.  Repeat EKG basically normal except for low voltage QRS.  Echocardiogram basically normal.  Lactic acid was normal.  No signs or symptoms of infection at this time.  Cortisol low side of normal.  Corticotropin stimulation test raised concern for possible adrenal insufficiency. -Start  Cortef, 10 mg in the morning and 5 mg in the evening -Discontinue IV fluid -Follow-up ACTH level  Anorexia/moderate malnutrition-eating disorder?  BMI 16.39.  Denies nausea, vomiting or abdominal pain.  Abdominal exam benign.  Low prealbumin.  -Not sure if negative  since a good choice.  Megace would increase risk of VTE -Appreciate dietitian input -Boost Breeze po TID-QID, each supplement provides 250 kcal and 9 grams of protein -[employer] 1.4 PO daily, each provides 455 kcals and 20g protein   Hypokalemia/hypomagnesemia: Resolving.  K3.6.  Magnesium normal. -K-dur 40 mEq once.  Hypophosphatemia: P1.5. -Replenish with potassium phosphate 10 mmol  Hypocalcemia: Resolved.  [facility]/presyncope-feel and stroke her forehead but no LOC.  Cranial, cervical and maxillofacial CT without acute finding.  For likely due to hypotension -Fall precautions -PT/OT eval  Paranoid schizophrenia/insomnia: Seems stable now.  No SI, HI or audiovisual hallucination. -Per psychiatry.  Interventions: Boost Breeze, MVI [employer])   DVT prophylaxis:  enoxaparin (LOVENOX) injection 40 mg Start: 03/26/20 1000  Code Status: Full code Family Communication: Patient and/or RN.  Status is: Inpatient  Remains inpatient appropriate because:   Dispo: The patient is from: [facility]              Anticipated d/c is to: [facility]              Anticipated d/c date is: 1 day              Patient currently is medically stable to d/c. Will notify TOC       Consultants:  Psychiatry   Sch Meds:  Scheduled Meds: . dronabinol  5 mg Oral BID AC  . enoxaparin (LOVENOX) injection  40 mg Subcutaneous Q24H  . feeding supplement  1 Container Oral TID BM  . feeding supplement ([employer] STANDARD 1.4)  325 mL Oral Daily  . Lumateperone Tosylate  42 mg Oral QHS  . megestrol  40 mg Oral QID  . multivitamin with minerals  1 tablet Oral Daily   Continuous Infusions: . 0.9 % NaCl with KCl 20 mEq / L 75 mL/hr at 03/27/20 0139   PRN Meds:.acetaminophen **OR** acetaminophen, traZODone  Antimicrobials: Anti-infectives (From admission, onward)   None       I have personally reviewed the following labs and images: CBC: Recent Labs  Lab 03/21/20 1719 03/25/20 1406 03/26/20 0004  WBC 6.3 4.3 5.0  NEUTROABS  --  2.6 3.0  HGB 14.6 9.6* 12.5  HCT 43.7 29.7* 38.6  MCV 84.5 88.1 87.7  PLT 213 125* 175   BMP &GFR Recent Labs  Lab 03/22/20 0422 03/22/20 0422 03/25/20 1406 03/25/20 2215 03/25/20 2256 03/26/20 0004 03/26/20 0836 03/27/20 0519  NA 141  --  140 142  --   --  141 142  K 3.0*   < > 2.6* 2.6*  --  3.5 2.8* 3.6  CL 101  --  103 112*  --   --  107 106  CO2 27  --  27 21*  --    --  24 26  GLUCOSE 83  --  85 83  --   --  108* 97  BUN <5*  --  5* <5*  --   --  5* <5*  CREATININE 0.58  --  0.71 0.49  --   --  0.62 0.55  CALCIUM 8.0*  --  6.9* 5.9*  --   --  7.4* 8.7*  MG  --   --  1.6*  --  4.0*  --  1.9 2.0  PHOS  --   --   --   --   --   --  1.6* 3.1   < > = values in this interval not displayed.  Estimated Creatinine Clearance: 60.1 mL/min (by C-G formula based on SCr of 0.55 mg/dL). Liver & Pancreas: Recent Labs  Lab 03/25/20 1406 03/26/20 0836 03/27/20 0519  AST 16 16  --   ALT 7 8  --   ALKPHOS 39 41  --   BILITOT 0.4 0.3  --   PROT 4.8* 5.2*  --   ALBUMIN 2.5* 2.8* 3.1*   No results for input(s): LIPASE, AMYLASE in the last 168 hours. No results for input(s): AMMONIA in the last 168 hours. Diabetic: No results for input(s): HGBA1C in the last 72 hours. Recent Labs  Lab 03/21/20 0556 03/23/20 0538 03/24/20 0607 03/25/20 0524 03/25/20 1712  GLUCAP 149* 92 97 77 94   Cardiac Enzymes: No results for input(s): CKTOTAL, CKMB, CKMBINDEX, TROPONINI in the last 168 hours. No results for input(s): PROBNP in the last 8760 hours. Coagulation Profile: No results for input(s): INR, PROTIME in the last 168 hours. Thyroid Function Tests: No results for input(s): TSH, T4TOTAL, FREET4, T3FREE, THYROIDAB in the last 72 hours. Lipid Profile: No results for input(s): CHOL, HDL, LDLCALC, TRIG, CHOLHDL, LDLDIRECT in the last 72 hours. Anemia Panel: No results for input(s): VITAMINB12, FOLATE, FERRITIN, TIBC, IRON, RETICCTPCT in the last 72 hours. Urine analysis:    Component Value Date/Time   COLORURINE STRAW (A) 03/25/2020 1608   APPEARANCEUR CLEAR 03/25/2020 1608   LABSPEC 1.004 (L) 03/25/2020 1608   PHURINE 6.0 03/25/2020 1608   GLUCOSEU NEGATIVE 03/25/2020 1608   HGBUR SMALL (A) 03/25/2020 1608   BILIRUBINUR NEGATIVE 03/25/2020 1608   KETONESUR 5 (A) 03/25/2020 1608   PROTEINUR NEGATIVE 03/25/2020 1608   UROBILINOGEN 0.2 04/10/2015 1111    NITRITE NEGATIVE 03/25/2020 1608   LEUKOCYTESUR NEGATIVE 03/25/2020 1608   Sepsis Labs: Invalid input(s): PROCALCITONIN, LACTICIDVEN  Microbiology: Recent Results (from the past 240 hour(s))  Blood culture (routine x 2)     Status: None (Preliminary result)   Collection Time: 03/25/20  8:36 AM   Specimen: BLOOD RIGHT HAND  Result Value Ref Range Status   Specimen Description   Final    BLOOD RIGHT HAND Performed at [facility], [address], [place], [place] [address]    Special Requests   Final    BOTTLES DRAWN AEROBIC AND ANAEROBIC Blood Culture adequate volume Performed at [facility], [address] [place], [place] [address]    Culture   Final    NO GROWTH < 24 HOURS Performed at [facility], [address], [place], [place] [address]    Report Status PENDING  Incomplete  Blood culture (routine x 2)     Status: None (Preliminary result)   Collection Time: 03/25/20  2:07 PM   Specimen: BLOOD RIGHT HAND  Result Value Ref Range Status   Specimen Description   Final    BLOOD RIGHT HAND Performed at [facility], [address] [place], [place] [address]    Special Requests   Final    BOTTLES DRAWN AEROBIC AND ANAEROBIC Blood Culture adequate volume Performed at [facility], [address] [place], [place] [address]    Culture   Final    NO GROWTH 2 DAYS Performed at [facility], [address], [place], [place] [address]    Report Status PENDING  Incomplete  Urine culture     Status: Abnormal   Collection Time: 03/25/20  4:08 PM   Specimen: Urine, Clean Catch  Result Value Ref Range  Status   Specimen Description   Final    URINE, CLEAN CATCH Performed at [facility], [address] [place], [place] [address]    Special Requests   Final    NONE Performed at [facility], [address], [place], [place] [address]    Culture (A)   Final    40,000 COLONIES/mL GROUP B STREP(S.AGALACTIAE)ISOLATED TESTING AGAINST S. AGALACTIAE NOT ROUTINELY PERFORMED DUE TO PREDICTABILITY OF AMP/PEN/VAN SUSCEPTIBILITY. Performed at [facility], [address], [place], [place] [address]    Report Status 03/26/2020 FINAL  Final  SARS CORONAVIRUS 2 (TAT 6-24 HRS) Nasopharyngeal Nasopharyngeal Swab     Status: None   Collection Time: 03/26/20 12:36 PM   Specimen: Nasopharyngeal Swab  Result Value Ref Range Status   SARS Coronavirus 2 NEGATIVE NEGATIVE Final    Comment: (NOTE) SARS-CoV-2 target nucleic acids are NOT DETECTED.  The SARS-CoV-2 RNA is generally detectable in upper and lower respiratory specimens during the acute phase of infection. Negative results do not preclude SARS-CoV-2 infection, do not rule out co-infections with other pathogens, and should not be used as the sole basis for treatment or other patient management decisions. Negative results must be combined with clinical observations, patient history, and epidemiological information. The expected result is Negative.  Fact Sheet for Patients: HairSlick.no  Fact Sheet for Healthcare Providers: quierodirigir.com  This test is not yet approved or cleared by the [place] FDA and  has been authorized for detection and/or diagnosis of SARS-CoV-2 by FDA under an Emergency Use Authorization (EUA). This EUA will remain  in effect (meaning this test can be used) for the duration of the COVID-19 declaration under Se ction 564(b)(1) of the Act, 21 U.S.C. section 360bbb-3(b)(1), unless the authorization is terminated or revoked sooner.  Performed at [facility], [address], [place], [place] [address]     Radiology Studies: ECHOCARDIOGRAM COMPLETE  Result Date: 03/27/2020    ECHOCARDIOGRAM REPORT   Patient Name:   Katelynn Dimalanta Date of Exam: 03/27/2020 Medical Rec #:  [id number]   Height:       69.0  in Accession #:    [id number]  Weight:       111.0 lb Date of Birth:  Mar 04, 1961   BSA:          1.609 m Patient Age:    59 years    BP:           152/87 mmHg Patient Gender: F           HR:           80 bpm. Exam Location:  Inpatient Procedure: 2D Echo, Cardiac Doppler and Color Doppler Indications:    Syncope  History:        Patient has no prior history of Echocardiogram examinations.                 Signs/Symptoms:Syncope; Risk Factors:Hypotension. Medication                 noncompliance, Schizophrenia.  Sonographer:    Lavenia Atlas Referring Phys: [id number] Luvena Wentling T Glendora Clouatre IMPRESSIONS  1. Left ventricular ejection fraction, by estimation, is 60 to 65%. The left ventricle has normal function. The left ventricle has no regional wall motion abnormalities. Left ventricular diastolic parameters were normal.  2. Right ventricular systolic function is normal. The right ventricular size is normal. There is normal pulmonary artery systolic pressure.  3. Right atrial size was  mildly dilated.  4. The mitral valve is normal in structure. Trivial mitral valve regurgitation. No evidence of mitral stenosis.  5. The aortic valve is normal in structure. Aortic valve regurgitation is not visualized. No aortic stenosis is present.  6. The inferior vena cava is normal in size with greater than 50% respiratory variability, suggesting right atrial pressure of 3 mmHg. FINDINGS  Left Ventricle: Left ventricular ejection fraction, by estimation, is 60 to 65%. The left ventricle has normal function. The left ventricle has no regional wall motion abnormalities. The left ventricular internal cavity size was normal in size. There is  no left ventricular hypertrophy. Left ventricular diastolic parameters were normal. Right Ventricle: The right ventricular size is normal. No increase in right ventricular wall thickness. Right ventricular systolic function is normal. There is normal pulmonary artery systolic pressure. The tricuspid  regurgitant velocity is 2.80 m/s, and  with an assumed right atrial pressure of 3 mmHg, the estimated right ventricular systolic pressure is 34.4 mmHg. Left Atrium: Left atrial size was normal in size. Right Atrium: Right atrial size was mildly dilated. Pericardium: There is no evidence of pericardial effusion. Mitral Valve: The mitral valve is normal in structure. There is mild thickening of the mitral valve leaflet(s). Trivial mitral valve regurgitation. No evidence of mitral valve stenosis. Tricuspid Valve: The tricuspid valve is normal in structure. Tricuspid valve regurgitation is not demonstrated. No evidence of tricuspid stenosis. Aortic Valve: The aortic valve is normal in structure. Aortic valve regurgitation is not visualized. No aortic stenosis is present. Pulmonic Valve: The pulmonic valve was normal in structure. Pulmonic valve regurgitation is not visualized. No evidence of pulmonic stenosis. Aorta: The aortic root is normal in size and structure. Venous: The inferior vena cava is normal in size with greater than 50% respiratory variability, suggesting right atrial pressure of 3 mmHg. IAS/Shunts: The interatrial septum was not well visualized.  LEFT VENTRICLE PLAX 2D LVIDd:         3.40 cm  Diastology LVIDs:         2.30 cm  LV e' medial:    11.50 cm/s LV PW:         1.00 cm  LV E/e' medial:  6.7 LV IVS:        1.00 cm  LV e' lateral:   12.50 cm/s LVOT diam:     1.60 cm  LV E/e' lateral: 6.2 LV SV:         47 LV SV Index:   29 LVOT Area:     2.01 cm  RIGHT VENTRICLE RV Basal diam:  3.10 cm RV S prime:     11.60 cm/s TAPSE (M-mode): 2.4 cm LEFT ATRIUM             Index       RIGHT ATRIUM           Index LA diam:        2.70 cm 1.68 cm/m  RA Area:     13.30 cm LA Vol (A2C):   26.0 ml 16.16 ml/m RA Volume:   34.80 ml  21.63 ml/m LA Vol (A4C):   29.6 ml 18.40 ml/m LA Biplane Vol: 30.4 ml 18.90 ml/m  AORTIC VALVE LVOT Vmax:   109.00 cm/s LVOT Vmean:  68.200 cm/s LVOT VTI:    0.232 m  AORTA Ao Root  diam: 2.70 cm MITRAL VALVE               TRICUSPID VALVE MV Area (PHT): 2.50  cm    TR Peak grad:   31.4 mmHg MV Decel Time: 303 msec    TR Vmax:        280.00 cm/s MV E velocity: 77.60 cm/s MV A velocity: 54.80 cm/s  SHUNTS MV E/A ratio:  1.42        Systemic VTI:  0.23 m                            Systemic Diam: 1.60 cm Charlton Haws MD Electronically signed by Charlton Haws MD Signature Date/Time: 03/27/2020/11:28:27 AM    Final      Yosef Krogh T. Nykayla Marcelli [facility]  If 7PM-7AM, please contact night-coverage [email or web address] 03/27/2020, 2:04 PM

## 2020-03-27 NOTE — Progress Notes (Signed)
  Echocardiogram 2D Echocardiogram has been performed.  Pieter Partridge 03/27/2020, 11:09 AM

## 2020-03-27 NOTE — TOC Progression Note (Signed)
Transition of Care [facility]) - Progression Note    Patient Details  Name: Lizvet Chunn MRN: [id number] Date of Birth: 1961/07/08  Transition of Care [facility]) CM/SW Contact  Darleene Cleaver, [place] Phone Number: 03/27/2020, 3:22 PM  Clinical Narrative:     CSW was informed that patient is medically cleared to return to [facility].  CSW contacted [facility], and she said to have psych reconsulted.  Patient currently does not have a bed available at this time.  CSW continuing to follow patient's progress throughout discharge planning.      Expected Discharge Plan and Services           Expected Discharge Date:  (unknown)                                     Social Determinants of Health (SDOH) Interventions    Readmission Risk Interventions No flowsheet data found.

## 2020-03-27 NOTE — Progress Notes (Signed)
Initial Nutrition Assessment  DOCUMENTATION CODES:   Non-severe (moderate) malnutrition in context of chronic illness, Underweight  INTERVENTION:   -Boost Breeze po TID, each supplement provides 250 kcal and 9 grams of protein (may have QID if desired) -[employer] 1.4 PO daily, each provides 455 kcals and 20g protein  -Pre-packaged items may be better accepted from patient especially if opened in room with pt observing.  NUTRITION DIAGNOSIS:   Moderate Malnutrition related to chronic illness (schizophrenia) as evidenced by mild fat depletion, moderate muscle depletion, energy intake < or equal to 75% for > or equal to 1 month.  GOAL:   Patient will meet greater than or equal to 90% of their needs  MONITOR:   PO intake, Supplement acceptance, Labs, Weight trends, I & O's  REASON FOR ASSESSMENT:   Consult Assessment of nutrition requirement/status  ASSESSMENT:   59 year old female with history of paranoid schizophrenia and medication noncompliance presenting from [facility] due to hypotension, presyncope and fall. She stroke her right forehead but no LOC.  Patient in room with sitter at bedside. Pt reports no appetite today. Pt very flat in affect. Pt states she has been drinking some water and has been urinating a lot d/t IVF. Pt does not like Ensure, says they mess up her stomach. Does like the Boost Breeze supplements and is willing to try plant based protein [employer] supplement as well (if pt consumes Boost Breeze TID and [employer] once daily, this will provide 1205 kcals and 47g protein).   Pt has had recent previous admissions where she was refusing meals and supplements d/t paranoid schizophrenia.  RD provided both flavors of Boost Breeze for pt to try, pt sipping on supplement during visit. Will need to prepare supplements in front of patient to ensure acceptance.  Pt denies issues with swallowing or chewing. States she has not felt an improvement in her appetite since  starting appetite stimulants (just started yesterday 9/7).  Per weight records, pt has lost minimal weight.  Weighed 113 lbs on 8/29 Current weight: 111 lbs. However, pt is still underweight and malnourished.  Medications: Marinol, Megace, Multivitamin with minerals daily, KLOR-CON Labs reviewed: Mg/Phos WNL  NUTRITION - FOCUSED PHYSICAL EXAM:    Most Recent Value  Orbital Region Mild depletion  Upper Arm Region Mild depletion  Thoracic and Lumbar Region Unable to assess  Buccal Region Mild depletion  Temple Region Mild depletion  Clavicle Bone Region Moderate depletion  Clavicle and Acromion Bone Region Moderate depletion  Scapular Bone Region Unable to assess  Dorsal Hand Moderate depletion  Patellar Region No depletion  Anterior Thigh Region No depletion  Posterior Calf Region Mild depletion  Edema (RD Assessment) None  Hair Reviewed  Eyes Reviewed  Mouth Reviewed  Skin Reviewed       Diet Order:   Diet Order            Diet regular Room service appropriate? Yes; Fluid consistency: Thin  Diet effective now                 EDUCATION NEEDS:   Education needs have been addressed  Skin:  Skin Assessment: Reviewed RN Assessment  Last BM:  PTA  Height:   Ht Readings from Last 1 Encounters:  03/26/20 5\' 9"  (1.753 m)    Weight:   Wt Readings from Last 1 Encounters:  03/26/20 50.3 kg   BMI:  Body mass index is 16.39 kg/m.  Estimated Nutritional Needs:   Kcal:  05/26/20  Protein:  75-90g  Fluid:  1.9L/day   Tilda Franco, MS, RD, LDN Inpatient Clinical Dietitian Contact information available via Amion

## 2020-03-28 LAB — RENAL FUNCTION PANEL
Albumin: 2.5 g/dL — ABNORMAL LOW (ref 3.5–5.0)
Anion gap: 5 (ref 5–15)
BUN: 13 mg/dL (ref 6–20)
CO2: 26 mmol/L (ref 22–32)
Calcium: 7.9 mg/dL — ABNORMAL LOW (ref 8.9–10.3)
Chloride: 107 mmol/L (ref 98–111)
Creatinine, Ser: 0.78 mg/dL (ref 0.44–1.00)
GFR calc Af Amer: >60 mL/min (ref >60)
GFR calc non Af Amer: >60 mL/min (ref >60)
Glucose, Bld: 102 mg/dL — ABNORMAL HIGH (ref 70–99)
Phosphorus: 3.4 mg/dL (ref 2.5–4.6)
Potassium: 3.9 mmol/L (ref 3.5–5.1)
Sodium: 138 mmol/L (ref 135–145)

## 2020-03-28 LAB — MAGNESIUM: Magnesium: 2.2 mg/dL (ref 1.7–2.4)

## 2020-03-28 LAB — ACTH: C206 ACTH: 12.6 pg/mL (ref 7.2–63.3)

## 2020-03-28 NOTE — Progress Notes (Signed)
PROGRESS NOTE  Zada Hultin ZOX:[id number] DOB: 1960/09/02   PCP: [facility]  Patient is from: [facility]  DOA: 03/25/2020 LOS: 2  Brief Narrative / Interim history: 59 year old female with history of paranoid schizophrenia and medication noncompliance presenting from [facility] due to hypotension, presyncope and fall. She stroke her right forehead but no LOC.  In ED, SBP in sixties.  Orthostatic vitals negative.  Platelets 125> 175.  K2.6> 3.5.  MG 1.6. Ca 5.9 (corrects to 7.1 for low albumin).  Otherwise CBC and BMP without significant finding.  Initial EKG with ectopic rhythm, low voltage QRS and nonspecific T wave changes.  UA with 5 ketones.  Hemoccult negative.  Head, cervical spine and maxillofacial CT without acute finding.  Received IV fluid boluses with improvement in her blood pressure.  Also received potassium supplementations, and admitted for overnight observation.  The next day, remained slightly hypotensive.  K2.8.  Continued on IV fluid and potassium supplementations. Eventually, hypokalemia resolved.  No events on telemetry.  Repeat EKG normal sinus rhythm with low voltage QRS.  Echocardiogram without significant finding. Patient had borderline low cortisol level. ACTH stimulation test concerning for some degree of secondary adrenal insufficiency. She was a started on low-dose p.o. Cortef. However, ACTH level was in normal making secondary adrenal insufficiency unlikely. Cortef discontinued. She is still have soft blood pressures, mainly at night but not symptomatic from this.  Patient was cleared medically on 03/27/2020 but lost her bed at [facility].   Subjective: Seen and examined earlier this morning. No major events overnight of this morning. No complaints. She denies chest pain, dyspnea, palpitation, dizziness/lightheadedness, GI or UTI symptoms. She denies SI/HI/AVD. She did not feel lightheaded or dizzy when she got up to go to the bathroom. Reports eating dinner  and drinking boost.  Objective: Vitals:   03/27/20 2143 03/27/20 2212 03/28/20 0641 03/28/20 1307  BP: (!) 82/55 (!) 91/52 (!) 93/53 (!) 95/54  Pulse: 71 72 64 67  Resp:   18 16  Temp: 98.3 F (36.8 C)  98.6 F (37 C) 97.8 F (36.6 C)  TempSrc: Oral  Oral Oral  SpO2: 97%  98% 100%  Weight:      Height:        Intake/Output Summary (Last 24 hours) at 03/28/2020 1520 Last data filed at 03/28/2020 0900 Gross per 24 hour  Intake 240 ml  Output --  Net 240 ml   Filed Weights   03/26/20 0005  Weight: 50.3 kg    Examination:  GENERAL: No apparent distress.  Nontoxic. HEENT: MMM.  Vision and hearing grossly intact.  NECK: Supple.  No apparent JVD.  RESP:  No IWOB.  Fair aeration bilaterally. CVS:  RRR. Heart sounds normal.  ABD/GI/GU: BS+. Abd soft, NTND.  MSK/EXT:  Moves extremities. No apparent deformity. No edema.  SKIN: no apparent skin lesion or wound NEURO: Awake, alert and oriented appropriately.  No apparent focal neuro deficit. PSYCH: Calm. Denies SI/HI/AVH  Procedures:  None  Microbiology summarized: COVID-19 PCR pending.  Assessment & Plan: Hypotension: Likely from dehydration due to poor p.o. intake.  Reportedly had been refusing to eat at [facility].  Initial EKG with ectopy but regular rhythm with wide low voltage QRS and nonspecific T wave changes.  Repeat EKG basically normal except for low voltage QRS.  Echo and lactic acid within normal. No signs or symptoms of infection at this time.  Cortisol low side of normal.  Corticotropin stimulation test raised concern for possible secondary adrenal  insufficiency but ACTH level was in normal. -Discontinue p.o. Cortef.  Anorexia/moderate malnutrition-eating disorder?  BMI 16.39.  Denies nausea, vomiting or abdominal pain.  Abdominal exam benign.  Low prealbumin.  -Not sure if negative since a good choice.  Megace would increase risk of VTE -Appreciate dietitian input -Boost Breeze po TID-QID, each supplement provides 250  kcal and 9 grams of protein -[employer] 1.4 PO daily, each provides 455 kcals and 20g protein   Hypokalemia/hypomagnesemia/hypophosphatemia/hypocalcemia: Resolved.  [facility]ope-feel and stroke her forehead but no LOC.  Cranial, cervical and maxillofacial CT without acute finding.  For likely due to hypotension.  -Fall precautions -PT/OT eval  Paranoid schizophrenia/insomnia: Seems stable now.  No SI, HI or audiovisual hallucination. -Per psychiatry.  Interventions: Boost Breeze, MVI Jae [employer])   DVT prophylaxis:  enoxaparin (LOVENOX) injection 40 mg Start: 03/26/20 1000  Code Status: Full code Family Communication: Patient and/or RN.  Status is: Inpatient  Remains inpatient appropriate because: Due to unsafe disposition. Patient is cleared medically but waiting on [facility].   Dispo: The patient is from: [facility]              Anticipated d/c is to: [facility]              Anticipated d/c date is: 1 day              Patient currently is medically stable to d/c.       Consultants:  Psychiatry   Sch Meds:  Scheduled Meds: . dronabinol  5 mg Oral BID AC  . enoxaparin (LOVENOX) injection  40 mg Subcutaneous Q24H  . feeding supplement  1 Container Oral TID BM  . feeding supplement ([employer] STANDARD 1.4)  325 mL Oral Daily  . hydrocortisone  10 mg Oral Daily  . hydrocortisone  5 mg Oral QPM  . Lumateperone Tosylate  42 mg Oral QHS  . megestrol  40 mg Oral QID  . multivitamin with minerals  1 tablet Oral Daily   Continuous Infusions:  PRN Meds:.acetaminophen **OR** acetaminophen, traZODone  Antimicrobials: Anti-infectives (From admission, onward)   None       I have personally reviewed the following labs and images: CBC: Recent Labs  Lab 03/21/20 1719 03/25/20 1406 03/26/20 0004  WBC 6.3 4.3 5.0  NEUTROABS  --  2.6 3.0  HGB 14.6 9.6* 12.5  HCT 43.7 29.7* 38.6  MCV 84.5 88.1 87.7  PLT 213 125* 175   BMP &GFR Recent Labs  Lab  03/25/20 1406 03/25/20 1406 03/25/20 2215 03/25/20 2256 03/26/20 0004 03/26/20 0836 03/27/20 0519 03/28/20 0549  NA 140  --  142  --   --  141 142 138  K 2.6*   < > 2.6*  --  3.5 2.8* 3.6 3.9  CL 103  --  112*  --   --  107 106 107  CO2 27  --  21*  --   --  24 26 26   GLUCOSE 85  --  83  --   --  108* 97 102*  BUN 5*  --  <5*  --   --  5* <5* 13  CREATININE 0.71  --  0.49  --   --  0.62 0.55 0.78  CALCIUM 6.9*  --  5.9*  --   --  7.4* 8.7* 7.9*  MG 1.6*  --   --  4.0*  --  1.9 2.0 2.2  PHOS  --   --   --   --   --  1.6* 3.1 3.4   < > = values in this interval not displayed.   Estimated Creatinine Clearance: 60.1 mL/min (by C-G formula based on SCr of 0.78 mg/dL). Liver & Pancreas: Recent Labs  Lab 03/25/20 1406 03/26/20 0836 03/27/20 0519 03/28/20 0549  AST 16 16  --   --   ALT 7 8  --   --   ALKPHOS 39 41  --   --   BILITOT 0.4 0.3  --   --   PROT 4.8* 5.2*  --   --   ALBUMIN 2.5* 2.8* 3.1* 2.5*   No results for input(s): LIPASE, AMYLASE in the last 168 hours. No results for input(s): AMMONIA in the last 168 hours. Diabetic: No results for input(s): HGBA1C in the last 72 hours. Recent Labs  Lab 03/23/20 0538 03/24/20 0607 03/25/20 0524 03/25/20 1712  GLUCAP 92 97 77 94   Cardiac Enzymes: No results for input(s): CKTOTAL, CKMB, CKMBINDEX, TROPONINI in the last 168 hours. No results for input(s): PROBNP in the last 8760 hours. Coagulation Profile: No results for input(s): INR, PROTIME in the last 168 hours. Thyroid Function Tests: No results for input(s): TSH, T4TOTAL, FREET4, T3FREE, THYROIDAB in the last 72 hours. Lipid Profile: No results for input(s): CHOL, HDL, LDLCALC, TRIG, CHOLHDL, LDLDIRECT in the last 72 hours. Anemia Panel: No results for input(s): VITAMINB12, FOLATE, FERRITIN, TIBC, IRON, RETICCTPCT in the last 72 hours. Urine analysis:    Component Value Date/Time   COLORURINE STRAW (A) 03/25/2020 1608   APPEARANCEUR CLEAR 03/25/2020 1608    LABSPEC 1.004 (L) 03/25/2020 1608   PHURINE 6.0 03/25/2020 1608   GLUCOSEU NEGATIVE 03/25/2020 1608   HGBUR SMALL (A) 03/25/2020 1608   BILIRUBINUR NEGATIVE 03/25/2020 1608   KETONESUR 5 (A) 03/25/2020 1608   PROTEINUR NEGATIVE 03/25/2020 1608   UROBILINOGEN 0.2 04/10/2015 1111   NITRITE NEGATIVE 03/25/2020 1608   LEUKOCYTESUR NEGATIVE 03/25/2020 1608   Sepsis Labs: Invalid input(s): PROCALCITONIN, LACTICIDVEN  Microbiology: Recent Results (from the past 240 hour(s))  Blood culture (routine x 2)     Status: None (Preliminary result)   Collection Time: 03/25/20  8:36 AM   Specimen: BLOOD RIGHT HAND  Result Value Ref Range Status   Specimen Description   Final    BLOOD RIGHT HAND Performed at [facility], [address] [place], [place] [address]    Special Requests   Final    BOTTLES DRAWN AEROBIC AND ANAEROBIC Blood Culture adequate volume Performed at [facility], [address] [place], [place] [address]    Culture   Final    NO GROWTH 2 DAYS Performed at [facility], [address] [place], [place] [address]    Report Status PENDING  Incomplete  Blood culture (routine x 2)     Status: None (Preliminary result)   Collection Time: 03/25/20  2:07 PM   Specimen: BLOOD RIGHT HAND  Result Value Ref Range Status   Specimen Description   Final    BLOOD RIGHT HAND Performed at [facility], [address], [place], [place] [address]    Special Requests   Final    BOTTLES DRAWN AEROBIC AND ANAEROBIC Blood Culture adequate volume Performed at [facility] [address], [place], [place] [address]    Culture   Final    NO GROWTH 3 DAYS Performed at [facility], [address], [place], [place] [address]    Report Status PENDING  Incomplete  Urine culture     Status: Abnormal   Collection Time: 03/25/20  4:08 PM   Specimen: Urine, Clean Catch  Result Value Ref Range Status    Specimen Description   Final    URINE, CLEAN CATCH Performed at [facility], [address] [place], [place] [address]    Special Requests   Final    NONE Performed at [facility], [address], [place], [place] [address]    Culture (A)  Final    40,000 COLONIES/mL GROUP B STREP(S.AGALACTIAE)ISOLATED TESTING AGAINST S. AGALACTIAE NOT ROUTINELY PERFORMED DUE TO PREDICTABILITY OF AMP/PEN/VAN SUSCEPTIBILITY. Performed at [facility], [address], [place], [place] [address]    Report Status 03/26/2020 FINAL  Final  SARS CORONAVIRUS 2 (TAT 6-24 HRS) Nasopharyngeal Nasopharyngeal Swab     Status: None   Collection Time: 03/26/20 12:36 PM   Specimen: Nasopharyngeal Swab  Result Value Ref Range Status   SARS Coronavirus 2 NEGATIVE NEGATIVE Final    Comment: (NOTE) SARS-CoV-2 target nucleic acids are NOT DETECTED.  The SARS-CoV-2 RNA is generally detectable in upper and lower respiratory specimens during the acute phase of infection. Negative results do not preclude SARS-CoV-2 infection, do not rule out co-infections with other pathogens, and should not be used as the sole basis for treatment or other patient management decisions. Negative results must be combined with clinical observations, patient history, and epidemiological information. The expected result is Negative.  Fact Sheet for Patients: HairSlick.no  Fact Sheet for Healthcare Providers: quierodirigir.com  This test is not yet approved or cleared by the [place] FDA and  has been authorized for detection and/or diagnosis of SARS-CoV-2 by FDA under an Emergency Use Authorization (EUA). This EUA will remain  in effect (meaning this test can be used) for the duration of the COVID-19 declaration under Se ction 564(b)(1) of the Act, 21 U.S.C. section 360bbb-3(b)(1), unless the authorization is terminated or revoked  sooner.  Performed at [facility], [address], [place], [place] [address]     Radiology Studies: No results found.   Tor Tsuda T. Nikko Quast [facility]  If 7PM-7AM, please contact night-coverage [email or web address] 03/28/2020, 3:20 PM

## 2020-03-29 LAB — MAGNESIUM: Magnesium: 2.3 mg/dL (ref 1.7–2.4)

## 2020-03-29 LAB — RENAL FUNCTION PANEL
Albumin: 2.6 g/dL — ABNORMAL LOW (ref 3.5–5.0)
Anion gap: 9 (ref 5–15)
BUN: 19 mg/dL (ref 6–20)
CO2: 26 mmol/L (ref 22–32)
Calcium: 8.2 mg/dL — ABNORMAL LOW (ref 8.9–10.3)
Chloride: 105 mmol/L (ref 98–111)
Creatinine, Ser: 0.95 mg/dL (ref 0.44–1.00)
GFR calc Af Amer: >60 mL/min (ref >60)
GFR calc non Af Amer: >60 mL/min (ref >60)
Glucose, Bld: 100 mg/dL — ABNORMAL HIGH (ref 70–99)
Phosphorus: 3 mg/dL (ref 2.5–4.6)
Potassium: 3.5 mmol/L (ref 3.5–5.1)
Sodium: 140 mmol/L (ref 135–145)

## 2020-03-29 MED ORDER — POTASSIUM CHLORIDE CRYS ER 20 MEQ PO TBCR
40.0000 meq | EXTENDED_RELEASE_TABLET | Freq: Once | ORAL | Status: AC
  Administered 2020-03-29: 40 meq via ORAL
  Filled 2020-03-29: qty 2

## 2020-03-29 NOTE — Progress Notes (Signed)
PROGRESS NOTE  [name] WUJ:[id number] DOB: [birth year]   PCP: [facility]  Patient is from: [facility]  DOA: 03/25/2020 LOS: 3  Brief Narrative / Interim history: 60 year old female with history of paranoid schizophrenia and medication noncompliance presenting from [facility] due to hypotension, presyncope and fall. She stroke her right forehead but no LOC.  In ED, SBP in sixties.  Orthostatic vitals negative.  Platelets 125> 175.  K2.6> 3.5.  MG 1.6. Ca 5.9 (corrects to 7.1 for low albumin).  Otherwise CBC and BMP without significant finding.  Initial EKG with ectopic rhythm, low voltage QRS and nonspecific T wave changes.  UA with 5 ketones.  Hemoccult negative.  Head, cervical spine and maxillofacial CT without acute finding.  Received IV fluid boluses with improvement in her blood pressure.  Also received potassium supplementations, and admitted for overnight observation.  The next day, remained slightly hypotensive.  K2.8.  Continued on IV fluid and potassium supplementations. Eventually, hypokalemia resolved.  No events on telemetry.  Repeat EKG normal sinus rhythm with low voltage QRS.  Echocardiogram without significant finding. Patient had borderline low cortisol level. ACTH stimulation test concerning for some degree of secondary adrenal insufficiency. She was a started on low-dose p.o. Cortef. However, ACTH level was in normal making secondary adrenal insufficiency unlikely. Cortef discontinued. She is still have soft blood pressures, mainly at night but not symptomatic from this.  Patient was cleared medically on 03/27/2020 but lost her bed at [facility].   Subjective: Seen and examined earlier this morning.  No major events overnight of this morning.  No complaints.  Denies chest pain, dyspnea, palpitation, dizziness, GI or UTI symptoms.  She says she ate her dinner last night.  Has not ordered her breakfast yet.  Denies SI/HI/AVH.  Objective: Vitals:   03/28/20 0641  03/28/20 1307 03/28/20 2107 03/29/20 0516  BP: (!) 93/53 (!) 95/54 (!) 82/50 (!) 94/58  Pulse: 64 67 65 (!) 54  Resp: 18 16 14 15   Temp: 98.6 F (37 C) 97.8 F (36.6 C) 99 F (37.2 C) 98.9 F (37.2 C)  TempSrc: Oral Oral Oral Oral  SpO2: 98% 100% 98% 98%  Weight:      Height:        Intake/Output Summary (Last 24 hours) at 03/29/2020 1224 Last data filed at 03/28/2020 1800 Gross per 24 hour  Intake 477 ml  Output --  Net 477 ml   Filed Weights   03/26/20 0005  Weight: 50.3 kg    Examination:  GENERAL: No apparent distress.  Nontoxic. HEENT: MMM.  Vision and hearing grossly intact.  NECK: Supple.  No apparent JVD.  RESP:  No IWOB.  Fair aeration bilaterally. CVS:  RRR. Heart sounds normal.  ABD/GI/GU: BS+. Abd soft, NTND.  MSK/EXT:  Moves extremities. No apparent deformity. No edema.  SKIN: no apparent skin lesion or wound NEURO: Sleepy but wakes to voice easily.  Oriented appropriately.  No apparent focal neuro deficit. PSYCH: Calm. Normal affect.  Denies SI/HI/AVH.  Procedures:  None  Microbiology summarized: COVID-19 PCR pending.  Assessment & Plan: Hypotension: Likely from dehydration due to poor p.o. intake.  Reportedly had been refusing to eat at [facility].  Initial EKG with ectopy but regular rhythm with wide low voltage QRS and nonspecific T wave changes.  Repeat EKG basically normal except for low voltage QRS.  Echo and lactic acid within normal. No signs or symptoms of infection at this time.  Cortisol low side of normal.  Corticotropin stimulation  test raised concern for possible secondary adrenal insufficiency.  She was a started on p.o. Cortef but ACTH level was in normal.  P.o. Cortef discontinued.  Still with soft blood pressures but not symptomatic. -Encourage hydration and p.o. intake  Anorexia/moderate malnutrition-eating disorder?  BMI 16.39.  Denies nausea, vomiting or abdominal pain.  Abdominal exam benign.  Low prealbumin.  -Not sure if negative since a  good choice.  Megace would increase risk of VTE -Appreciate dietitian input -Boost Breeze po TID-QID, each supplement provides 250 kcal and 9 grams of protein -[employer] Farms 1.4 PO daily, each provides 455 kcals and 20g protein   Hypokalemia/hypomagnesemia/hypophosphatemia/hypocalcemia: Resolved.  [facility]yncope-feel and stroke her forehead but no LOC.  Cranial, cervical and maxillofacial CT without acute finding.  For likely due to hypotension.  -Fall precautions -PT/OT eval  Paranoid schizophrenia/insomnia: Seems stable now.  No SI, HI or audiovisual hallucination. -Per psychiatry.  Interventions: Boost Breeze, MVI Jae [employer])   DVT prophylaxis:  enoxaparin (LOVENOX) injection 40 mg Start: 03/26/20 1000  Code Status: Full code Family Communication: Patient and/or RN.  Status is: Inpatient  Remains inpatient appropriate because: Due to unsafe disposition. Patient is cleared medically but waiting on [facility].   Dispo: The patient is from: [facility]              Anticipated d/c is to: [facility]              Anticipated d/c date is: 1 day              Patient currently is medically stable to d/c.       Consultants:  Psychiatry   Sch Meds:  Scheduled Meds: . dronabinol  5 mg Oral BID AC  . enoxaparin (LOVENOX) injection  40 mg Subcutaneous Q24H  . feeding supplement  1 Container Oral TID BM  . feeding supplement (KATE FARMS STANDARD 1.4)  325 mL Oral Daily  . Lumateperone Tosylate  42 mg Oral QHS  . megestrol  40 mg Oral QID  . multivitamin with minerals  1 tablet Oral Daily   Continuous Infusions:  PRN Meds:.acetaminophen **OR** acetaminophen, traZODone  Antimicrobials: Anti-infectives (From admission, onward)   None       I have personally reviewed the following labs and images: CBC: Recent Labs  Lab 03/25/20 1406 03/26/20 0004  WBC 4.3 5.0  NEUTROABS 2.6 3.0  HGB 9.6* 12.5  HCT 29.7* 38.6  MCV 88.1 87.7  PLT 125* 175   BMP &GFR Recent  Labs  Lab 03/25/20 1406 03/25/20 2215 03/25/20 2215 03/25/20 2256 03/26/20 0004 03/26/20 0836 03/27/20 0519 03/28/20 0549 03/29/20 0513  NA  --  142  --   --   --  141 142 138 140  K  --  2.6*   < >  --  3.5 2.8* 3.6 3.9 3.5  CL  --  112*  --   --   --  107 106 107 105  CO2  --  21*  --   --   --  24 26 26 26   GLUCOSE  --  83  --   --   --  108* 97 102* 100*  BUN  --  <5*  --   --   --  5* <5* 13 19  CREATININE  --  0.49  --   --   --  0.62 0.55 0.78 0.95  CALCIUM  --  5.9*  --   --   --  7.4*  8.7* 7.9* 8.2*  MG   < >  --   --  4.0*  --  1.9 2.0 2.2 2.3  PHOS  --   --   --   --   --  1.6* 3.1 3.4 3.0   < > = values in this interval not displayed.   Estimated Creatinine Clearance: 50.6 mL/min (by C-G formula based on SCr of 0.95 mg/dL). Liver & Pancreas: Recent Labs  Lab 03/25/20 1406 03/26/20 0836 03/27/20 0519 03/28/20 0549 03/29/20 0513  AST 16 16  --   --   --   ALT 7 8  --   --   --   ALKPHOS 39 41  --   --   --   BILITOT 0.4 0.3  --   --   --   PROT 4.8* 5.2*  --   --   --   ALBUMIN 2.5* 2.8* 3.1* 2.5* 2.6*   No results for input(s): LIPASE, AMYLASE in the last 168 hours. No results for input(s): AMMONIA in the last 168 hours. Diabetic: No results for input(s): HGBA1C in the last 72 hours. Recent Labs  Lab 03/23/20 0538 03/24/20 0607 03/25/20 0524 03/25/20 1712  GLUCAP 92 97 77 94   Cardiac Enzymes: No results for input(s): CKTOTAL, CKMB, CKMBINDEX, TROPONINI in the last 168 hours. No results for input(s): PROBNP in the last 8760 hours. Coagulation Profile: No results for input(s): INR, PROTIME in the last 168 hours. Thyroid Function Tests: No results for input(s): TSH, T4TOTAL, FREET4, T3FREE, THYROIDAB in the last 72 hours. Lipid Profile: No results for input(s): CHOL, HDL, LDLCALC, TRIG, CHOLHDL, LDLDIRECT in the last 72 hours. Anemia Panel: No results for input(s): VITAMINB12, FOLATE, FERRITIN, TIBC, IRON, RETICCTPCT in the last 72 hours. Urine  analysis:    Component Value Date/Time   COLORURINE STRAW (A) 03/25/2020 1608   APPEARANCEUR CLEAR 03/25/2020 1608   LABSPEC 1.004 (L) 03/25/2020 1608   PHURINE 6.0 03/25/2020 1608   GLUCOSEU NEGATIVE 03/25/2020 1608   HGBUR SMALL (A) 03/25/2020 1608   BILIRUBINUR NEGATIVE 03/25/2020 1608   KETONESUR 5 (A) 03/25/2020 1608   PROTEINUR NEGATIVE 03/25/2020 1608   UROBILINOGEN 0.2 04/10/2015 1111   NITRITE NEGATIVE 03/25/2020 1608   LEUKOCYTESUR NEGATIVE 03/25/2020 1608   Sepsis Labs: Invalid input(s): PROCALCITONIN, LACTICIDVEN  Microbiology: Recent Results (from the past 240 hour(s))  Blood culture (routine x 2)     Status: None (Preliminary result)   Collection Time: 03/25/20  8:36 AM   Specimen: BLOOD RIGHT HAND  Result Value Ref Range Status   Specimen Description   Final    BLOOD RIGHT HAND Performed at [facility], [address], [place], [place] [address]    Special Requests   Final    BOTTLES DRAWN AEROBIC AND ANAEROBIC Blood Culture adequate volume Performed at [facility], [address] [place], [place] [address]    Culture   Final    NO GROWTH 3 DAYS Performed at [facility], [address] [place], [place] [address]    Report Status PENDING  Incomplete  Blood culture (routine x 2)     Status: None (Preliminary result)   Collection Time: 03/25/20  2:07 PM   Specimen: BLOOD RIGHT HAND  Result Value Ref Range Status   Specimen Description   Final    BLOOD RIGHT HAND Performed at [facility], [address] [place], [place] [address]    Special Requests  Final    BOTTLES DRAWN AEROBIC AND ANAEROBIC Blood Culture adequate volume Performed at [facility], [address], [place], [place] [address]    Culture   Final    NO GROWTH 4 DAYS Performed at [facility], [address] [place], [place] [address]    Report Status PENDING  Incomplete  Urine culture      Status: Abnormal   Collection Time: 03/25/20  4:08 PM   Specimen: Urine, Clean Catch  Result Value Ref Range Status   Specimen Description   Final    URINE, CLEAN CATCH Performed at [facility], [address] [place], [place] [address]    Special Requests   Final    NONE Performed at [facility], [address] [place], [place] [address]    Culture (A)  Final    40,000 COLONIES/mL GROUP B STREP(S.AGALACTIAE)ISOLATED TESTING AGAINST S. AGALACTIAE NOT ROUTINELY PERFORMED DUE TO PREDICTABILITY OF AMP/PEN/VAN SUSCEPTIBILITY. Performed at [facility], [address], [place], [place] [address]    Report Status 03/26/2020 FINAL  Final  SARS CORONAVIRUS 2 (TAT 6-24 HRS) Nasopharyngeal Nasopharyngeal Swab     Status: None   Collection Time: 03/26/20 12:36 PM   Specimen: Nasopharyngeal Swab  Result Value Ref Range Status   SARS Coronavirus 2 NEGATIVE NEGATIVE Final    Comment: (NOTE) SARS-CoV-2 target nucleic acids are NOT DETECTED.  The SARS-CoV-2 RNA is generally detectable in upper and lower respiratory specimens during the acute phase of infection. Negative results do not preclude SARS-CoV-2 infection, do not rule out co-infections with other pathogens, and should not be used as the sole basis for treatment or other patient management decisions. Negative results must be combined with clinical observations, patient history, and epidemiological information. The expected result is Negative.  Fact Sheet for Patients: HairSlick.no  Fact Sheet for Healthcare Providers: quierodirigir.com  This test is not yet approved or cleared by the [place] FDA and  has been authorized for detection and/or diagnosis of SARS-CoV-2 by FDA under an Emergency Use Authorization (EUA). This EUA will remain  in effect (meaning this test can be used) for the duration of the COVID-19 declaration  under Se ction 564(b)(1) of the Act, 21 U.S.C. section 360bbb-3(b)(1), unless the authorization is terminated or revoked sooner.  Performed at [facility], [address] [place], [place] [address]     Radiology Studies: No results found.   Avani Sensabaugh T. Tiler Brandis [facility]  If 7PM-7AM, please contact night-coverage [email or web address] 03/29/2020, 12:24 PM

## 2020-03-29 NOTE — Consult Note (Signed)
  Patient case discussed with Dr. Lucianne Muss who recommends patient be re-admitted to [facility], continued inpatient recommendation prior to discharging her home. It is also felt by her inpatient psychiatric team that she has chronic mental illness that would be benefit from services through [facility] hospital. Disposition social work has been consulted to facilitate referral to [facility] for inpatient admission. Will continue to monitor. Patient will need to be assessed tomorrow if there is no bed available for her at our inpatient, as she was medically cleared on 03/27/2020.

## 2020-03-29 NOTE — Care Management Important Message (Signed)
Important Message  Patient Details IM Letter given to the Patient Name: [name] MRN: [id number] Date of Birth: July 15, 1961   Medicare Important Message Given:  Yes     Caren Macadam 03/29/2020, 11:55 AM

## 2020-03-30 DIAGNOSIS — D649 Anemia, unspecified: Secondary | ICD-10-CM

## 2020-03-30 DIAGNOSIS — N179 Acute kidney failure, unspecified: Secondary | ICD-10-CM

## 2020-03-30 LAB — RENAL FUNCTION PANEL
Albumin: 2.8 g/dL — ABNORMAL LOW (ref 3.5–5.0)
Anion gap: 10 (ref 5–15)
BUN: 15 mg/dL (ref 6–20)
CO2: 26 mmol/L (ref 22–32)
Calcium: 8.5 mg/dL — ABNORMAL LOW (ref 8.9–10.3)
Chloride: 103 mmol/L (ref 98–111)
Creatinine, Ser: 0.58 mg/dL (ref 0.44–1.00)
GFR calc Af Amer: >60 mL/min (ref >60)
GFR calc non Af Amer: >60 mL/min (ref >60)
Glucose, Bld: 91 mg/dL (ref 70–99)
Phosphorus: 3.8 mg/dL (ref 2.5–4.6)
Potassium: 4 mmol/L (ref 3.5–5.1)
Sodium: 139 mmol/L (ref 135–145)

## 2020-03-30 LAB — MAGNESIUM: Magnesium: 2.3 mg/dL (ref 1.7–2.4)

## 2020-03-30 LAB — CULTURE, BLOOD (ROUTINE X 2)
Culture: NO GROWTH
Special Requests: ADEQUATE

## 2020-03-30 LAB — CBC
HCT: 33.6 % — ABNORMAL LOW (ref 36.0–46.0)
Hemoglobin: 10.7 g/dL — ABNORMAL LOW (ref 12.0–15.0)
MCH: 28.1 pg (ref 26.0–34.0)
MCHC: 31.8 g/dL (ref 30.0–36.0)
MCV: 88.2 fL (ref 80.0–100.0)
Platelets: 161 10*3/uL (ref 150–400)
RBC: 3.81 MIL/uL — ABNORMAL LOW (ref 3.87–5.11)
RDW: 13.7 % (ref 11.5–15.5)
WBC: 6.2 10*3/uL (ref 4.0–10.5)
nRBC: 0 % (ref 0.0–0.2)

## 2020-03-30 NOTE — Plan of Care (Signed)
Problem: Pain Managment: Goal: General experience of comfort will improve Outcome: Completed/Met

## 2020-03-30 NOTE — Progress Notes (Signed)
PROGRESS NOTE  [name] AOZ:[id number] DOB: [birth year]   PCP: [facility]  Patient is from: [facility]  DOA: 03/25/2020 LOS: 4  Brief Narrative / Interim history: 59 year old female with history of paranoid schizophrenia and medication noncompliance presenting from [facility] due to hypotension, presyncope and fall. She stroke her right forehead but no LOC.  In ED, SBP in sixties.  Orthostatic vitals negative.  Platelets 125> 175.  K2.6> 3.5.  MG 1.6. Ca 5.9 (corrects to 7.1 for low albumin).  Otherwise CBC and BMP without significant finding.  Initial EKG with ectopic rhythm, low voltage QRS and nonspecific T wave changes.  UA with 5 ketones.  Hemoccult negative.  Head, cervical spine and maxillofacial CT without acute finding.  Received IV fluid boluses with improvement in her blood pressure.  Also received potassium supplementations, and admitted for overnight observation.  The next day, remained slightly hypotensive.  K2.8.  Continued on IV fluid and potassium supplementations. Eventually, hypokalemia resolved.  No events on telemetry.  Repeat EKG normal sinus rhythm with low voltage QRS.  Echocardiogram without significant finding. Patient had borderline low cortisol level. ACTH stimulation test concerning for some degree of secondary adrenal insufficiency. She was a started on low-dose p.o. Cortef. However, ACTH level was in normal making secondary adrenal insufficiency unlikely. Cortef discontinued. She is still have soft blood pressures, mainly at night but not symptomatic from this.  Patient was cleared medically on 03/27/2020 but lost her bed at [facility].  Psych is now working on placement to [facility] hospital for inpatient psychiatry  Subjective: Seen and examined earlier this morning.  No major events overnight of this morning.  No complaints.  Denies chest pain, dyspnea, GI or UTI symptoms.  Reports doing well with p.o. intake.  Denies SI/HI/AVH.  Objective: Vitals:    03/29/20 1314 03/29/20 1529 03/29/20 2109 03/30/20 0551  BP: (!) 90/48 (!) 98/51 (!) 90/49 103/61  Pulse: 73 67 60 (!) 52  Resp:   16 14  Temp:   98.4 F (36.9 C) 97.7 F (36.5 C)  TempSrc:   Oral Oral  SpO2:   99% 100%  Weight:      Height:        Intake/Output Summary (Last 24 hours) at 03/30/2020 1201 Last data filed at 03/30/2020 1100 Gross per 24 hour  Intake 840 ml  Output --  Net 840 ml   Filed Weights   03/26/20 0005  Weight: 50.3 kg    Examination:  GENERAL: No apparent distress.  Nontoxic. HEENT: MMM.  Vision and hearing grossly intact.  NECK: Supple.  No apparent JVD.  RESP: On RA.  No IWOB.  Fair aeration bilaterally. CVS:  RRR. Heart sounds normal.  ABD/GI/GU: BS+. Abd soft, NTND.  MSK/EXT:  Moves extremities. No apparent deformity. No edema.  SKIN: no apparent skin lesion or wound NEURO: Awake, alert and oriented appropriately.  No apparent focal neuro deficit. PSYCH: Calm.  Denies SI/HI/AVH.  Procedures:  None  Microbiology summarized: COVID-19 PCR pending.  Assessment & Plan: Hypotension: Likely from dehydration due to poor p.o. intake.  Reportedly had been refusing to eat at [facility].  Initial EKG with ectopy but regular rhythm with wide low voltage QRS and nonspecific T wave changes.  Repeat EKG basically normal except for low voltage QRS.  Echo and lactic acid within normal. No signs or symptoms of infection at this time.  Cortisol low side of normal.  Corticotropin stimulation test raised concern for possible secondary adrenal insufficiency.  She  was a started on p.o. Cortef but ACTH level was in normal.  P.o. Cortef discontinued.  Normotensive except for nocturnal soft blood pressures.  She is asymptomatic. -Encourage hydration and p.o. intake  Anorexia/moderate malnutrition-eating disorder?  BMI 16.39.  Denies nausea, vomiting or abdominal pain.  Abdominal exam benign.  Low prealbumin.  -Not sure if negative since a good choice.  Megace would increase  risk of VTE -Appreciate dietitian input -Boost Breeze po TID-QID, each supplement provides 250 kcal and 9 grams of protein -[employer] 1.4 PO daily, each provides 455 kcals and 20g protein   Hypokalemia/hypomagnesemia/hypophosphatemia/hypocalcemia: Resolved.  Normocytic anemia: Hgb 12.5 (admit)>>> 10.7.  Likely some degree of dilution.  Denies melena or hematochezia. -Check anemia panel  AKI: Cr peaked at 0.95 and down to 0.58. -Encourage oral hydration.  [facility] at behavioral [facility]/presyncope-feel and stroke her forehead but no LOC.  Cranial, cervical and maxillofacial CT without acute finding.  For likely due to hypotension.  -Fall precautions -PT/OT eval  Paranoid schizophrenia/insomnia: Seems stable now.  No SI, HI or audiovisual hallucination. -Per psychiatry.  Interventions: Boost Breeze, MVI Jae [employer])   DVT prophylaxis:  enoxaparin (LOVENOX) injection 40 mg Start: 03/26/20 1000  Code Status: Full code Family Communication: Patient and/or RN.  Status is: Inpatient  Remains inpatient appropriate because: Due to unsafe disposition. Patient is cleared medically but waiting on inpatient psych.  Lost her bed at [facility]. Psych now working on placement at another inpatient psych hospital.   Dispo: The patient is from: [facility]              Anticipated d/c is to: [facility] or another inpatient psych hospital              Anticipated d/c date is: 2 days              Patient currently is medically stable to d/c.       Consultants:  Psychiatry   Sch Meds:  Scheduled Meds: . dronabinol  5 mg Oral BID AC  . enoxaparin (LOVENOX) injection  40 mg Subcutaneous Q24H  . feeding supplement  1 Container Oral TID BM  . feeding supplement (KATE FARMS STANDARD 1.4)  325 mL Oral Daily  . Lumateperone Tosylate  42 mg Oral QHS  . megestrol  40 mg Oral QID  . multivitamin with minerals  1 tablet Oral Daily   Continuous Infusions:  PRN Meds:.acetaminophen **OR** acetaminophen,  traZODone  Antimicrobials: Anti-infectives (From admission, onward)   None       I have personally reviewed the following labs and images: CBC: Recent Labs  Lab 03/25/20 1406 03/26/20 0004 03/30/20 0543  WBC 4.3 5.0 6.2  NEUTROABS 2.6 3.0  --   HGB 9.6* 12.5 10.7*  HCT 29.7* 38.6 33.6*  MCV 88.1 87.7 88.2  PLT 125* 175 161   BMP &GFR Recent Labs  Lab 03/26/20 0836 03/27/20 0519 03/28/20 0549 03/29/20 0513 03/30/20 0543  NA 141 142 138 140 139  K 2.8* 3.6 3.9 3.5 4.0  CL 107 106 107 105 103  CO2 24 26 26 26 26   GLUCOSE 108* 97 102* 100* 91  BUN 5* <5* 13 19 15   CREATININE 0.62 0.55 0.78 0.95 0.58  CALCIUM 7.4* 8.7* 7.9* 8.2* 8.5*  MG 1.9 2.0 2.2 2.3 2.3  PHOS 1.6* 3.1 3.4 3.0 3.8   Estimated Creatinine Clearance: 60.1 mL/min (by C-G formula based on SCr of 0.58 mg/dL). Liver & Pancreas: Recent Labs  Lab 03/25/20 1406 03/25/20 1406  03/26/20 0836 03/27/20 0519 03/28/20 0549 03/29/20 0513 03/30/20 0543  AST 16  --  16  --   --   --   --   ALT 7  --  8  --   --   --   --   ALKPHOS 39  --  41  --   --   --   --   BILITOT 0.4  --  0.3  --   --   --   --   PROT 4.8*  --  5.2*  --   --   --   --   ALBUMIN 2.5*   < > 2.8* 3.1* 2.5* 2.6* 2.8*   < > = values in this interval not displayed.   No results for input(s): LIPASE, AMYLASE in the last 168 hours. No results for input(s): AMMONIA in the last 168 hours. Diabetic: No results for input(s): HGBA1C in the last 72 hours. Recent Labs  Lab 03/24/20 0607 03/25/20 0524 03/25/20 1712  GLUCAP 97 77 94   Cardiac Enzymes: No results for input(s): CKTOTAL, CKMB, CKMBINDEX, TROPONINI in the last 168 hours. No results for input(s): PROBNP in the last 8760 hours. Coagulation Profile: No results for input(s): INR, PROTIME in the last 168 hours. Thyroid Function Tests: No results for input(s): TSH, T4TOTAL, FREET4, T3FREE, THYROIDAB in the last 72 hours. Lipid Profile: No results for input(s): CHOL, HDL,  LDLCALC, TRIG, CHOLHDL, LDLDIRECT in the last 72 hours. Anemia Panel: No results for input(s): VITAMINB12, FOLATE, FERRITIN, TIBC, IRON, RETICCTPCT in the last 72 hours. Urine analysis:    Component Value Date/Time   COLORURINE STRAW (A) 03/25/2020 1608   APPEARANCEUR CLEAR 03/25/2020 1608   LABSPEC 1.004 (L) 03/25/2020 1608   PHURINE 6.0 03/25/2020 1608   GLUCOSEU NEGATIVE 03/25/2020 1608   HGBUR SMALL (A) 03/25/2020 1608   BILIRUBINUR NEGATIVE 03/25/2020 1608   KETONESUR 5 (A) 03/25/2020 1608   PROTEINUR NEGATIVE 03/25/2020 1608   UROBILINOGEN 0.2 04/10/2015 1111   NITRITE NEGATIVE 03/25/2020 1608   LEUKOCYTESUR NEGATIVE 03/25/2020 1608   Sepsis Labs: Invalid input(s): PROCALCITONIN, LACTICIDVEN  Microbiology: Recent Results (from the past 240 hour(s))  Blood culture (routine x 2)     Status: None (Preliminary result)   Collection Time: 03/25/20  8:36 AM   Specimen: BLOOD RIGHT HAND  Result Value Ref Range Status   Specimen Description   Final    BLOOD RIGHT HAND Performed at [facility], [address], [place], [place] [address]    Special Requests   Final    BOTTLES DRAWN AEROBIC AND ANAEROBIC Blood Culture adequate volume Performed at [facility], [address] [place], [place] [address]    Culture   Final    NO GROWTH 3 DAYS Performed at [facility], [address] [place], [place] [address]    Report Status PENDING  Incomplete  Blood culture (routine x 2)     Status: None (Preliminary result)   Collection Time: 03/25/20  2:07 PM   Specimen: BLOOD RIGHT HAND  Result Value Ref Range Status   Specimen Description   Final    BLOOD RIGHT HAND Performed at [facility], [address], [place], [place] [address]    Special Requests   Final    BOTTLES DRAWN AEROBIC AND ANAEROBIC Blood Culture adequate volume Performed at [facility], [address] [place], [place] [address]     Culture   Final  NO GROWTH 4 DAYS Performed at [facility], [address] [place], [place] [address]    Report Status PENDING  Incomplete  Urine culture     Status: Abnormal   Collection Time: 03/25/20  4:08 PM   Specimen: Urine, Clean Catch  Result Value Ref Range Status   Specimen Description   Final    URINE, CLEAN CATCH Performed at [facility], [address], [place], [place] [address]    Special Requests   Final    NONE Performed at [facility], [address] [place], [place] [address]    Culture (A)  Final    40,000 COLONIES/mL GROUP B STREP(S.AGALACTIAE)ISOLATED TESTING AGAINST S. AGALACTIAE NOT ROUTINELY PERFORMED DUE TO PREDICTABILITY OF AMP/PEN/VAN SUSCEPTIBILITY. Performed at [facility], [address], [place], [place] [address]    Report Status 03/26/2020 FINAL  Final  SARS CORONAVIRUS 2 (TAT 6-24 HRS) Nasopharyngeal Nasopharyngeal Swab     Status: None   Collection Time: 03/26/20 12:36 PM   Specimen: Nasopharyngeal Swab  Result Value Ref Range Status   SARS Coronavirus 2 NEGATIVE NEGATIVE Final    Comment: (NOTE) SARS-CoV-2 target nucleic acids are NOT DETECTED.  The SARS-CoV-2 RNA is generally detectable in upper and lower respiratory specimens during the acute phase of infection. Negative results do not preclude SARS-CoV-2 infection, do not rule out co-infections with other pathogens, and should not be used as the sole basis for treatment or other patient management decisions. Negative results must be combined with clinical observations, patient history, and epidemiological information. The expected result is Negative.  Fact Sheet for Patients: HairSlick.no  Fact Sheet for Healthcare Providers: quierodirigir.com  This test is not yet approved or cleared by the [place] FDA and  has been authorized for detection and/or diagnosis of  SARS-CoV-2 by FDA under an Emergency Use Authorization (EUA). This EUA will remain  in effect (meaning this test can be used) for the duration of the COVID-19 declaration under Se ction 564(b)(1) of the Act, 21 U.S.C. section 360bbb-3(b)(1), unless the authorization is terminated or revoked sooner.  Performed at [facility], [address] [place], [place] [address]     Radiology Studies: No results found.   Karena Kinker T. Lynnmarie Lovett [facility]  If 7PM-7AM, please contact night-coverage [email or web address] 03/30/2020, 12:01 PM

## 2020-03-31 LAB — CBC
HCT: 32.4 % — ABNORMAL LOW (ref 36.0–46.0)
Hemoglobin: 10.4 g/dL — ABNORMAL LOW (ref 12.0–15.0)
MCH: 28.3 pg (ref 26.0–34.0)
MCHC: 32.1 g/dL (ref 30.0–36.0)
MCV: 88.3 fL (ref 80.0–100.0)
Platelets: 194 10*3/uL (ref 150–400)
RBC: 3.67 MIL/uL — ABNORMAL LOW (ref 3.87–5.11)
RDW: 13.9 % (ref 11.5–15.5)
WBC: 5 10*3/uL (ref 4.0–10.5)
nRBC: 0 % (ref 0.0–0.2)

## 2020-03-31 LAB — CULTURE, BLOOD (ROUTINE X 2)
Culture: NO GROWTH
Special Requests: ADEQUATE

## 2020-03-31 LAB — IRON AND TIBC
Iron: 60 ug/dL (ref 28–170)
Saturation Ratios: 22 % (ref 10.4–31.8)
TIBC: 275 ug/dL (ref 250–450)
UIBC: 215 ug/dL

## 2020-03-31 LAB — RETICULOCYTES
Immature Retic Fract: 24.7 % — ABNORMAL HIGH (ref 2.3–15.9)
RBC.: 3.67 MIL/uL — ABNORMAL LOW (ref 3.87–5.11)
Retic Count, Absolute: 59.8 10*3/uL (ref 19.0–186.0)
Retic Ct Pct: 1.6 % (ref 0.4–3.1)

## 2020-03-31 LAB — FOLATE: Folate: 5.6 ng/mL — ABNORMAL LOW (ref >5.9)

## 2020-03-31 LAB — FERRITIN: Ferritin: 160 ng/mL (ref 11–307)

## 2020-03-31 LAB — VITAMIN B12: Vitamin B-12: 732 pg/mL (ref 180–914)

## 2020-03-31 NOTE — Progress Notes (Signed)
PROGRESS NOTE  [name] ZOX:[id number] DOB: [birth year]   PCP: [name]  Patient is from: [name]  DOA: 03/25/2020 LOS: 5  Brief Narrative / Interim history: 59 year old female with history of paranoid schizophrenia and medication noncompliance presenting from [name] due to hypotension, presyncope and fall. She stroke her right forehead but no LOC.  In ED, SBP in sixties.  Orthostatic vitals negative.  Platelets 125> 175.  K2.6> 3.5.  MG 1.6. Ca 5.9 (corrects to 7.1 for low albumin).  Otherwise CBC and BMP without significant finding.  Initial EKG with ectopic rhythm, low voltage QRS and nonspecific T wave changes.  UA with 5 ketones.  Hemoccult negative.  Head, cervical spine and maxillofacial CT without acute finding.  Received IV fluid boluses with improvement in her blood pressure.  Also received potassium supplementations, and admitted for overnight observation.  The next day, remained slightly hypotensive.  K2.8.  Continued on IV fluid and potassium supplementations. Eventually, hypokalemia resolved.  No events on telemetry.  Repeat EKG normal sinus rhythm with low voltage QRS.  Echocardiogram without significant finding. Patient had borderline low cortisol level. ACTH stimulation test concerning for some degree of secondary adrenal insufficiency. She was a started on low-dose p.o. Cortef. However, ACTH level was in normal making secondary adrenal insufficiency unlikely. Cortef discontinued. She is still have soft blood pressures, mainly at night but not symptomatic from this.  Patient was cleared medically on 03/27/2020 but lost her bed at [name].  Psych is now working on placement to [name] for inpatient psychiatry  Assessment & Plan: Hypotension: Likely from dehydration due to poor p.o. intake.  Reportedly had been refusing to eat at [name].  Initial EKG with ectopy but regular rhythm with wide low voltage QRS and nonspecific T wave changes.  Repeat EKG basically  normal except for low voltage QRS.  Echo and lactic acid within normal. No signs or symptoms of infection at this time.  Cortisol low side of normal.  Corticotropin stimulation test raised concern for possible secondary adrenal insufficiency.  She was a started on p.o. Cortef but ACTH level was in normal.  P.o. Cortef discontinued.  Normotensive except for nocturnal soft blood pressures.  She is asymptomatic. -Encourage hydration and p.o. intake  Anorexia/moderate malnutrition-eating disorder?  BMI 16.39.  Denies nausea, vomiting or abdominal pain.  Abdominal exam benign.  Low prealbumin.  -Not sure if negative since a good choice.  Megace would increase risk of VTE -Appreciate dietitian input -Boost Breeze po TID-QID, each supplement provides 250 kcal and 9 grams of protein -Kate Farms 1.4 PO daily, each provides 455 kcals and 20g protein   Hypokalemia/hypomagnesemia/hypophosphatemia/hypocalcemia: Resolved.  Normocytic anemia: Hgb 12.5 (admit)>>> 10.7.  Likely some degree of dilution.  Denies melena or hematochezia. -Check anemia panel  AKI: Cr peaked at 0.95 and down to 0.58. -Encourage oral hydration.  [name]yncope-feel and stroke her forehead but no LOC.  Cranial, cervical and maxillofacial CT without acute finding.  For likely due to hypotension.  -Fall precautions -PT/OT eval  Paranoid schizophrenia/insomnia: Seems stable now.  No SI, HI or audiovisual hallucination. -Per psychiatry.  Interventions: Boost Breeze, MVI Jae [employer])   DVT prophylaxis:  enoxaparin (LOVENOX) injection 40 mg Start: 03/26/20 1000  Code Status: Full code Family Communication: Patient and/or RN.  No family present. Status is: Inpatient  Remains inpatient appropriate because: Due to unsafe disposition. Patient is cleared medically but waiting on inpatient psych.  [facility] bed at [name]. Psych now  working on placement at another inpatient [name].   Dispo: The patient is  from: [name]              Anticipated d/c is to: [facility] or another inpatient [name]              Anticipated d/c date is: 2 days              Patient currently is medically stable to d/c.       Consultants:  Psychiatry  Subjective: Seen and examined.  Sitter at the bedside.  She has no complaints.  She was alert and oriented.  Objective: Vitals:   03/29/20 2109 03/30/20 0551 03/30/20 1428 03/31/20 0519  BP: (!) 90/49 103/61 (!) 91/50 109/63  Pulse: 60 (!) 52 75 76  Resp: 16 14 14 19   Temp: 98.4 F (36.9 C) 97.7 F (36.5 C) 98.9 F (37.2 C) 98.2 F (36.8 C)  TempSrc: Oral Oral Oral   SpO2: 99% 100% 100% 98%  Weight:      Height:        Intake/Output Summary (Last 24 hours) at 03/31/2020 1400 Last data filed at 03/31/2020 1245 Gross per 24 hour  Intake 1560 ml  Output --  Net 1560 ml   Filed Weights   03/26/20 0005  Weight: 50.3 kg    Examination:  General exam: Appears calm and comfortable  Respiratory system: Clear to auscultation. Respiratory effort normal. Cardiovascular system: S1 & S2 heard, RRR. No JVD, murmurs, rubs, gallops or clicks. No pedal edema. Gastrointestinal system: Abdomen is nondistended, soft and nontender. No organomegaly or masses felt. Normal bowel sounds heard. Central nervous system: Alert and oriented. No focal neurological deficits. Extremities: Symmetric 5 x 5 power. Skin: No rashes, lesions or ulcers.  Psychiatry: Judgement and insight appear poor. Mood & affect flat.  Procedures:  None  Microbiology summarized: COVID-19 PCR pending.    Sch Meds:  Scheduled Meds: . dronabinol  5 mg Oral BID AC  . enoxaparin (LOVENOX) injection  40 mg Subcutaneous Q24H  . feeding supplement  1 Container Oral TID BM  . feeding supplement (KATE FARMS STANDARD 1.4)  325 mL Oral Daily  . Lumateperone Tosylate  42 mg Oral QHS  . megestrol  40 mg Oral QID  . multivitamin with minerals  1 tablet Oral Daily   Continuous Infusions:  PRN  Meds:.acetaminophen **OR** acetaminophen, traZODone  Antimicrobials: Anti-infectives (From admission, onward)   None       I have personally reviewed the following labs and images: CBC: Recent Labs  Lab 03/25/20 1406 03/26/20 0004 03/30/20 0543 03/31/20 0639  WBC 4.3 5.0 6.2 5.0  NEUTROABS 2.6 3.0  --   --   HGB 9.6* 12.5 10.7* 10.4*  HCT 29.7* 38.6 33.6* 32.4*  MCV 88.1 87.7 88.2 88.3  PLT 125* 175 161 194   BMP &GFR Recent Labs  Lab 03/26/20 0836 03/27/20 0519 03/28/20 0549 03/29/20 0513 03/30/20 0543  NA 141 142 138 140 139  K 2.8* 3.6 3.9 3.5 4.0  CL 107 106 107 105 103  CO2 24 26 26 26 26   GLUCOSE 108* 97 102* 100* 91  BUN 5* <5* 13 19 15   CREATININE 0.62 0.55 0.78 0.95 0.58  CALCIUM 7.4* 8.7* 7.9* 8.2* 8.5*  MG 1.9 2.0 2.2 2.3 2.3  PHOS 1.6* 3.1 3.4 3.0 3.8   Estimated Creatinine Clearance: 60.1 mL/min (by C-G formula based on SCr of 0.58 mg/dL). Liver & Pancreas: Recent Labs  Lab 03/25/20 1406 03/25/20 1406 03/26/20 0836 03/27/20 0519 03/28/20 0549 03/29/20 0513 03/30/20 0543  AST 16  --  16  --   --   --   --   ALT 7  --  8  --   --   --   --   ALKPHOS 39  --  41  --   --   --   --   BILITOT 0.4  --  0.3  --   --   --   --   PROT 4.8*  --  5.2*  --   --   --   --   ALBUMIN 2.5*   < > 2.8* 3.1* 2.5* 2.6* 2.8*   < > = values in this interval not displayed.   No results for input(s): LIPASE, AMYLASE in the last 168 hours. No results for input(s): AMMONIA in the last 168 hours. Diabetic: No results for input(s): HGBA1C in the last 72 hours. Recent Labs  Lab 03/25/20 0524 03/25/20 1712  GLUCAP 77 94   Cardiac Enzymes: No results for input(s): CKTOTAL, CKMB, CKMBINDEX, TROPONINI in the last 168 hours. No results for input(s): PROBNP in the last 8760 hours. Coagulation Profile: No results for input(s): INR, PROTIME in the last 168 hours. Thyroid Function Tests: No results for input(s): TSH, T4TOTAL, FREET4, T3FREE, THYROIDAB in the last  72 hours. Lipid Profile: No results for input(s): CHOL, HDL, LDLCALC, TRIG, CHOLHDL, LDLDIRECT in the last 72 hours. Anemia Panel: Recent Labs    03/31/20 0639  VITAMINB12 732  FOLATE 5.6*  FERRITIN 160  TIBC 275  IRON 60  RETICCTPCT 1.6   Urine analysis:    Component Value Date/Time   COLORURINE STRAW (A) 03/25/2020 1608   APPEARANCEUR CLEAR 03/25/2020 1608   LABSPEC 1.004 (L) 03/25/2020 1608   PHURINE 6.0 03/25/2020 1608   GLUCOSEU NEGATIVE 03/25/2020 1608   HGBUR SMALL (A) 03/25/2020 1608   BILIRUBINUR NEGATIVE 03/25/2020 1608   KETONESUR 5 (A) 03/25/2020 1608   PROTEINUR NEGATIVE 03/25/2020 1608   UROBILINOGEN 0.2 04/10/2015 1111   NITRITE NEGATIVE 03/25/2020 1608   LEUKOCYTESUR NEGATIVE 03/25/2020 1608   Sepsis Labs: Invalid input(s): PROCALCITONIN, LACTICIDVEN  Microbiology: Recent Results (from the past 240 hour(s))  Blood culture (routine x 2)     Status: None (Preliminary result)   Collection Time: 03/25/20  8:36 AM   Specimen: BLOOD RIGHT HAND  Result Value Ref Range Status   Specimen Description   Final    BLOOD RIGHT HAND Performed at [facility], [address] [place], [place] [address]    Special Requests   Final    BOTTLES DRAWN AEROBIC AND ANAEROBIC Blood Culture adequate volume Performed at [name], [address] [place], [place] [address]    Culture   Final    NO GROWTH 4 DAYS Performed at [facility], [address], [place], [place] [address]    Report Status PENDING  Incomplete  Blood culture (routine x 2)     Status: None   Collection Time: 03/25/20  2:07 PM   Specimen: BLOOD RIGHT HAND  Result Value Ref Range Status   Specimen Description   Final    BLOOD RIGHT HAND Performed at [name], [address], [place], [place] [address]    Special Requests   Final    BOTTLES DRAWN AEROBIC AND ANAEROBIC Blood Culture adequate volume Performed at [facility], [address] [place], [place] [address]    Culture   Final    NO GROWTH 5 DAYS Performed at [name], [address] [place], [place] [address]    Report Status 03/30/2020 FINAL  Final  Urine culture     Status: Abnormal   Collection Time: 03/25/20  4:08 PM   Specimen: Urine, Clean Catch  Result Value Ref Range Status   Specimen Description   Final    URINE, CLEAN CATCH Performed at [name], [address], [place], [place] [address]    Special Requests   Final    NONE Performed at [name], [address] [place], [place] [address]    Culture (A)  Final    40,000 COLONIES/mL GROUP B STREP(S.AGALACTIAE)ISOLATED TESTING AGAINST S. AGALACTIAE NOT ROUTINELY PERFORMED DUE TO PREDICTABILITY OF AMP/PEN/VAN SUSCEPTIBILITY. Performed at [facility], [address] [place], [place] [address]    Report Status 03/26/2020 FINAL  Final  SARS CORONAVIRUS 2 (TAT 6-24 HRS) Nasopharyngeal Nasopharyngeal Swab     Status: None   Collection Time: 03/26/20 12:36 PM   Specimen: Nasopharyngeal Swab  Result Value Ref Range Status   SARS Coronavirus 2 NEGATIVE NEGATIVE Final    Comment: (NOTE) SARS-CoV-2 target nucleic acids are NOT DETECTED.  The SARS-CoV-2 RNA is generally detectable in upper and lower respiratory specimens during the acute phase of infection. Negative results do not preclude SARS-CoV-2 infection, do not rule out co-infections with other pathogens, and should not be used as the sole basis for treatment or other patient management decisions. Negative results must be combined with clinical observations, patient history, and epidemiological information. The expected result is Negative.  Fact Sheet for Patients: HairSlick.no  Fact Sheet for Healthcare Providers: quierodirigir.com  This test is not yet approved or cleared by the [place] FDA and  has been authorized for detection and/or diagnosis of SARS-CoV-2 by FDA under an Emergency Use Authorization (EUA). This EUA will remain  in effect (meaning this test can be used) for the duration of the COVID-19 declaration under Se ction 564(b)(1) of the Act, 21 U.S.C. section 360bbb-3(b)(1), unless the authorization is terminated or revoked sooner.  Performed at [facility], [address] [place], [place] [address]     Radiology Studies: No results found.   Renne Crigler, MD [facility]  If 7PM-7AM, please contact night-coverage [email or web address] 03/31/2020, 2:00 PM

## 2020-03-31 NOTE — TOC Progression Note (Signed)
Transition of Care [facility]) - Progression Note    Patient Details  Name: [name] MRN: [id number] Date of Birth: 1960/10/26  Transition of [facility]) CM/SW Contact  Gustavus Bryant, [place] Phone Number: 03/31/2020, 3:25 PM  Clinical Narrative:   LCSW completed care coordination with disposition [facility] CSW and attending physician through secure chat messenger. [facility] CSW sent message to [facility] and it was confirmed that [name] can accept patient tomorrow. [facility] CSW was notified that patient is still interested in seeking care at [facility]. [facility] CSW reports that [place] facilities are on diversion and not currently accepting patients. LCSW left voice messages inquiring about this request with [facility] at both [place] and [facility] [place] center.    Expected Discharge Date:  (unknown)               Readmission Risk Interventions No flowsheet data found.  Dickie La, BSW, MSW, LCSW [email or web address]

## 2020-03-31 NOTE — TOC Progression Note (Addendum)
Transition of Care [facility]) - Progression Note    Patient Details  Name: [name] MRN: [id number] Date of Birth: May 16, 1961  Transition of Care [facility]) CM/SW Contact  [name], [place] Phone Number: 03/31/2020, 9:59 AM  Clinical Narrative:  LCSW completed chart review on 03/31/20. Patient will need to return back to [facility]. CSW spoke with [facility] on 03/31/20 and was informed that they are at capacity and would not be able to take patient until Monday or Tuesday. LCSW will update attending physician.   [facility]  Readmission Risk Interventions No flowsheet data found.   Dickie La, BSW, MSW, LCSW [place]  [email or web address]

## 2020-04-01 ENCOUNTER — Encounter (HOSPITAL_COMMUNITY): Payer: Self-pay | Admitting: Psychiatry

## 2020-04-01 ENCOUNTER — Other Ambulatory Visit: Payer: Self-pay

## 2020-04-01 ENCOUNTER — Inpatient Hospital Stay (HOSPITAL_COMMUNITY)
Admission: AD | Admit: 2020-04-01 | Discharge: 2020-04-04 | DRG: 885 | Disposition: A | Discharge status: Home / Self Care | Payer: Medicare Other | Source: Intra-hospital | Attending: Psychiatry | Admitting: Psychiatry

## 2020-04-01 DIAGNOSIS — E876 Hypokalemia: Secondary | ICD-10-CM | POA: Diagnosis present

## 2020-04-01 DIAGNOSIS — G4700 Insomnia, unspecified: Secondary | ICD-10-CM | POA: Diagnosis present

## 2020-04-01 DIAGNOSIS — F17210 Nicotine dependence, cigarettes, uncomplicated: Secondary | ICD-10-CM | POA: Diagnosis present

## 2020-04-01 DIAGNOSIS — E440 Moderate protein-calorie malnutrition: Secondary | ICD-10-CM | POA: Diagnosis present

## 2020-04-01 DIAGNOSIS — R41843 Psychomotor deficit: Secondary | ICD-10-CM | POA: Diagnosis present

## 2020-04-01 DIAGNOSIS — Z888 Allergy status to other drugs, medicaments and biological substances status: Secondary | ICD-10-CM

## 2020-04-01 DIAGNOSIS — E861 Hypovolemia: Secondary | ICD-10-CM | POA: Diagnosis present

## 2020-04-01 DIAGNOSIS — Z9119 Patient's noncompliance with other medical treatment and regimen: Secondary | ICD-10-CM

## 2020-04-01 DIAGNOSIS — F329 Major depressive disorder, single episode, unspecified: Secondary | ICD-10-CM | POA: Diagnosis present

## 2020-04-01 DIAGNOSIS — E873 Alkalosis: Secondary | ICD-10-CM | POA: Diagnosis present

## 2020-04-01 DIAGNOSIS — Z9114 Patient's other noncompliance with medication regimen: Secondary | ICD-10-CM

## 2020-04-01 DIAGNOSIS — Z91199 Patient's noncompliance with other medical treatment and regimen due to unspecified reason: Secondary | ICD-10-CM

## 2020-04-01 DIAGNOSIS — Z91148 Patient's other noncompliance with medication regimen for other reason: Secondary | ICD-10-CM

## 2020-04-01 DIAGNOSIS — I959 Hypotension, unspecified: Secondary | ICD-10-CM | POA: Diagnosis not present

## 2020-04-01 DIAGNOSIS — Z6820 Body mass index (BMI) 20.0-20.9, adult: Secondary | ICD-10-CM | POA: Diagnosis not present

## 2020-04-01 DIAGNOSIS — Z9111 Patient's noncompliance with dietary regimen: Secondary | ICD-10-CM | POA: Diagnosis not present

## 2020-04-01 DIAGNOSIS — Z79899 Other long term (current) drug therapy: Secondary | ICD-10-CM | POA: Diagnosis not present

## 2020-04-01 DIAGNOSIS — R630 Anorexia: Secondary | ICD-10-CM | POA: Diagnosis present

## 2020-04-01 DIAGNOSIS — F209 Schizophrenia, unspecified: Secondary | ICD-10-CM | POA: Diagnosis present

## 2020-04-01 DIAGNOSIS — F200 Paranoid schizophrenia: Principal | ICD-10-CM | POA: Diagnosis present

## 2020-04-01 DIAGNOSIS — E43 Unspecified severe protein-calorie malnutrition: Secondary | ICD-10-CM | POA: Diagnosis present

## 2020-04-01 DIAGNOSIS — Z886 Allergy status to analgesic agent status: Secondary | ICD-10-CM | POA: Diagnosis not present

## 2020-04-01 MED ORDER — MEGESTROL ACETATE 40 MG PO TABS
40.0000 mg | ORAL_TABLET | Freq: Four times a day (QID) | ORAL | Status: DC
  Administered 2020-04-01 – 2020-04-04 (×10): 40 mg via ORAL
  Filled 2020-04-01 (×21): qty 1

## 2020-04-01 MED ORDER — TRAZODONE HCL 50 MG PO TABS: 50.0000 mg | ORAL_TABLET | Freq: Every evening | ORAL | Status: DC | PRN

## 2020-04-01 MED ORDER — ELDERTONIC PO LIQD
15.0000 mL | Freq: Every day | ORAL | Status: DC
  Administered 2020-04-02 – 2020-04-04 (×3): 15 mL via ORAL
  Filled 2020-04-01 (×5): qty 15

## 2020-04-01 MED ORDER — MAGNESIUM HYDROXIDE 400 MG/5ML PO SUSP: 30.0000 mL | Freq: Every day | ORAL | Status: DC | PRN

## 2020-04-01 MED ORDER — ACETAMINOPHEN 325 MG PO TABS: 650.0000 mg | ORAL_TABLET | Freq: Four times a day (QID) | ORAL | Status: DC | PRN

## 2020-04-01 MED ORDER — BENZTROPINE MESYLATE 0.5 MG PO TABS: 0.5000 mg | ORAL_TABLET | Freq: Two times a day (BID) | ORAL | Status: DC | PRN

## 2020-04-01 MED ORDER — DRONABINOL 2.5 MG PO CAPS
2.5000 mg | ORAL_CAPSULE | Freq: Two times a day (BID) | ORAL | Status: DC
  Administered 2020-04-02 – 2020-04-03 (×4): 2.5 mg via ORAL
  Filled 2020-04-01 (×4): qty 1

## 2020-04-01 MED ORDER — BOOST / RESOURCE BREEZE PO LIQD CUSTOM
1.0000 | Freq: Three times a day (TID) | ORAL | Status: DC
  Administered 2020-04-02 – 2020-04-03 (×6): 1 via ORAL
  Filled 2020-04-01 (×14): qty 1

## 2020-04-01 MED ORDER — LUMATEPERONE TOSYLATE 42 MG PO CAPS
42.0000 mg | ORAL_CAPSULE | Freq: Every day | ORAL | Status: DC
  Administered 2020-04-02 – 2020-04-04 (×3): 42 mg via ORAL

## 2020-04-01 MED ORDER — HYDROXYZINE HCL 25 MG PO TABS: 25.0000 mg | ORAL_TABLET | Freq: Three times a day (TID) | ORAL | Status: DC | PRN

## 2020-04-01 MED ORDER — ALUM & MAG HYDROXIDE-SIMETH 200-200-20 MG/5ML PO SUSP: 30.0000 mL | ORAL | Status: DC | PRN

## 2020-04-01 MED ORDER — MIRTAZAPINE 15 MG PO TABS
15.0000 mg | ORAL_TABLET | Freq: Every day | ORAL | Status: DC
  Administered 2020-04-01 – 2020-04-03 (×3): 15 mg via ORAL
  Filled 2020-04-01 (×6): qty 1

## 2020-04-01 MED ORDER — HALOPERIDOL LACTATE 2 MG/ML PO CONC
5.0000 mg | Freq: Three times a day (TID) | ORAL | Status: DC
  Administered 2020-04-02 – 2020-04-04 (×7): 5 mg via ORAL
  Filled 2020-04-01 (×14): qty 2.5

## 2020-04-01 NOTE — Care Management Important Message (Signed)
Important Message  Patient Details IM Letter given to the Patient Name: [name] MRN: [id number] Date of Birth: 1961-01-23   Medicare Important Message Given:  Yes     Caren Macadam 04/01/2020, 11:33 AM

## 2020-04-01 NOTE — Progress Notes (Signed)
Report called to [facility]. Patient escorted to facility by [employer]. All belongings w/ patient.

## 2020-04-01 NOTE — Progress Notes (Signed)
Chaplain rounding-Looked in on this patient but she declined. Did not want a chaplain visit. Phebe Colla, Chaplain   04/01/20 1100  Clinical Encounter Type  Visited With Patient  Visit Type Initial  Spiritual Encounters  Spiritual Needs Other (Comment) (Patient did not want a visit)

## 2020-04-01 NOTE — Progress Notes (Signed)
Admission Note: Patient is a 59 year old female admitted to the unit from [facility] for paranoid behavior.  Patient is alert and oriented to person, place and time.  Presents with a flat affect and depressedmmood.  Denies suicidal thoughts, auditory and visual hallucinations.  Skin assessment completed.  Skin is dry and intact.  No contraband found.  Admission plan of care reviewed and consent signed.  Patient oriented to the unit, staff and room.  Routine safety checks initiated.  Patient is safe on the unit.

## 2020-04-01 NOTE — Discharge Summary (Signed)
Physician Discharge Summary  [name] WUX:[id number] DOB: 10-21-1960 DOA: 03/25/2020  PCP: [facility]  Admit date: 03/25/2020 Discharge date: 04/01/2020  Admitted From: [facility] Disposition: [facility]  Recommendations for Outpatient Follow-up:  1. Follow up with PCP in 1-2 weeks 2. Please obtain BMP/CBC in one week 3. Please follow up with your PCP on the following pending results: Unresulted Labs (From admission, onward)         None       Home Health: None Equipment/Devices: None  Discharge Condition: Stable CODE STATUS: Full code Diet recommendation: Regular  Subjective: Seen and examined.  No complaints.  Brief/Interim Summary: 59 year old female with history of paranoid schizophrenia and medication noncompliance presented from [facility] due to hypotension, presyncope and fall. She stroke her right forehead but no LOC.  In ED, SBP in sixties.  Orthostatic vitals negative.  Platelets 125> 175.  K2.6> 3.5.  MG 1.6. Ca 5.9 (corrects to 7.1 for low albumin).  Otherwise CBC and BMP without significant finding.  Initial EKG with ectopic rhythm, low voltage QRS and nonspecific T wave changes.  UA with 5 ketones.  Hemoccult negative.  Head, cervical spine and maxillofacial CT without acute finding.  Received IV fluid boluses with improvement in her blood pressure.  Also received potassium supplementations, and admitted for overnight observation.  The next day, remained slightly hypotensive.  K2.8.  Continued on IV fluid and potassium supplementations.  Eventually, hypokalemia resolved.  No events on telemetry.  Repeat EKG normal sinus rhythm with low voltage QRS.  Echocardiogram without significant finding. Patient had borderline low cortisol level. ACTH stimulation test concerning for some degree of secondary adrenal insufficiency. She was started on low-dose p.o. Cortef. However, ACTH level was in normal making secondary adrenal insufficiency unlikely. Cortef discontinued.  She is still  intermittently has low blood pressure but at night but she remains asymptomatic.  Patient was medically cleared on 03/27/2020 to be transferred to behavioral health however she lost her bed at behavioral health and finally [facility] was able to acquire a bed for her at behavioral health so she is going to be discharged in stable condition.  Further psychiatric medications to be managed by psychiatrist.   Discharge Diagnoses:  Principal Problem:   Hypotension Active Problems:   Anorexia   Hypokalemia   Hypocalcemia   Fall at home, initial encounter   Malnutrition of moderate degree    Discharge Instructions  Discharge Instructions    Discharge patient   Complete by: As directed    Discharge when bed available at [facility]   Discharge disposition: 65-Discharged/transferred to Psychiatric Hospital or Psychiatric Unit/Distinct Part of Hospital   Discharge patient date: 04/01/2020     Allergies as of 04/01/2020      Reactions   Aspirin Other (See Comments)   Possibly caused hives per sister   Geodon [ziprasidone Hcl] Other (See Comments)   Caused twitching      Medication List    TAKE these medications   aluminum-magnesium hydroxide-simethicone 200-200-20 MG/5ML Susp Commonly known as: MAALOX Take 30 mLs by mouth every 4 (four) hours as needed.   benztropine 0.5 MG tablet Commonly known as: COGENTIN Take 1 tablet (0.5 mg total) by mouth 2 (two) times daily as needed for tremors.   BOOST PO Take 1 Container by mouth 2 (two) times daily between meals.   dronabinol 5 MG capsule Commonly known as: MARINOL Take 5 mg by mouth 2 (two) times daily before a meal.   haloperidol 10 MG tablet  Commonly known as: HALDOL Take 1 tablet (10 mg total) by mouth 3 (three) times daily. What changed:   how much to take  when to take this  reasons to take this   hydrOXYzine 25 MG tablet Commonly known as: ATARAX/VISTARIL Take 1 tablet (25 mg total) by mouth 3 (three) times daily as needed for  anxiety.   Lumateperone Tosylate 42 MG Caps Take 42 mg by mouth daily.   megestrol 40 MG tablet Commonly known as: MEGACE Take 1 tablet (40 mg total) by mouth daily.   mirtazapine 30 MG tablet Commonly known as: REMERON Take 30 mg by mouth at bedtime.   multivitamin with minerals tablet Take 1 tablet by mouth daily.   paliperidone 6 MG 24 hr tablet Commonly known as: INVEGA Take 1 tablet (6 mg total) by mouth at bedtime.   traZODone 50 MG tablet Commonly known as: DESYREL Take 1 tablet (50 mg total) by mouth at bedtime.   TYLENOL PO Take 650 mg by mouth every 6 (six) hours as needed.       Follow-up Information    [facility] Follow up in 1 week(s).   Specialty: General Practice Contact information: [address] [place] [address] [phone]              Allergies  Allergen Reactions  . Aspirin Other (See Comments)    Possibly caused hives per sister  . Geodon [Ziprasidone Hcl] Other (See Comments)    Caused twitching    Consultations: Psychiatry   Procedures/Studies: CT Head Wo Contrast  Result Date: 03/25/2020 CLINICAL DATA:  Fall, laceration under RIGHT eye. EXAM: CT HEAD WITHOUT CONTRAST CT MAXILLOFACIAL WITHOUT CONTRAST CT CERVICAL SPINE WITHOUT CONTRAST TECHNIQUE: Multidetector CT imaging of the head, cervical spine, and maxillofacial structures were performed using the standard protocol without intravenous contrast. Multiplanar CT image reconstructions of the cervical spine and maxillofacial structures were also generated. COMPARISON:  None. FINDINGS: CT HEAD FINDINGS Brain: Ventricles are within normal limits in size and configuration. There is no mass, hemorrhage, edema or other evidence of acute parenchymal abnormality. No extra-axial hemorrhage. Vascular: Chronic calcified atherosclerotic changes of the large vessels at the skull base. No unexpected hyperdense vessel. Skull: Normal. Negative for fracture or focal lesion. Other:  None. CT MAXILLOFACIAL FINDINGS Osseous: Lower frontal bones are intact. No displaced nasal bone fracture. Osseous structures about the orbits are intact and normally aligned bilaterally. Walls of the maxillary sinuses are intact and normally aligned. Bilateral zygomatic arches and pterygoid plates are intact. No mandible fracture or displacement is seen. Lucency/defect at the base of the next to last RIGHT mandibular tooth, most likely periapical dental abscess, less likely acute traumatic dislodgement. Orbits: Negative. No traumatic or inflammatory finding. Sinuses: Clear. Soft tissues: Mild soft tissue edema overlying the upper RIGHT orbit/lower frontal bone. No underlying fracture. CT CERVICAL SPINE FINDINGS Alignment: Within normal limits. No evidence of acute vertebral body subluxation. Skull base and vertebrae: No fracture line or displaced fracture fragment is seen. Soft tissues and spinal canal: No prevertebral fluid or swelling. No visible canal hematoma. Disc levels:  Disc spaces are well maintained throughout. Upper chest: Negative. Other: None. IMPRESSION: 1. No acute intracranial abnormality. No intracranial mass, hemorrhage or edema. No skull fracture. 2. No facial bone fracture or displacement. Mild soft tissue edema overlying the upper RIGHT orbit/lower frontal bone. 3. Lucency/defect at the base of the next to last RIGHT mandibular tooth, most likely periapical dental abscess/carie, less likely acute traumatic dislodgement. 4.  No fracture or acute subluxation within the cervical spine. Electronically Signed   By: Bary Richard M.D.   On: 03/25/2020 15:37   CT Cervical Spine Wo Contrast  Result Date: 03/25/2020 CLINICAL DATA:  Fall, laceration under RIGHT eye. EXAM: CT HEAD WITHOUT CONTRAST CT MAXILLOFACIAL WITHOUT CONTRAST CT CERVICAL SPINE WITHOUT CONTRAST TECHNIQUE: Multidetector CT imaging of the head, cervical spine, and maxillofacial structures were performed using the standard protocol  without intravenous contrast. Multiplanar CT image reconstructions of the cervical spine and maxillofacial structures were also generated. COMPARISON:  None. FINDINGS: CT HEAD FINDINGS Brain: Ventricles are within normal limits in size and configuration. There is no mass, hemorrhage, edema or other evidence of acute parenchymal abnormality. No extra-axial hemorrhage. Vascular: Chronic calcified atherosclerotic changes of the large vessels at the skull base. No unexpected hyperdense vessel. Skull: Normal. Negative for fracture or focal lesion. Other: None. CT MAXILLOFACIAL FINDINGS Osseous: Lower frontal bones are intact. No displaced nasal bone fracture. Osseous structures about the orbits are intact and normally aligned bilaterally. Walls of the maxillary sinuses are intact and normally aligned. Bilateral zygomatic arches and pterygoid plates are intact. No mandible fracture or displacement is seen. Lucency/defect at the base of the next to last RIGHT mandibular tooth, most likely periapical dental abscess, less likely acute traumatic dislodgement. Orbits: Negative. No traumatic or inflammatory finding. Sinuses: Clear. Soft tissues: Mild soft tissue edema overlying the upper RIGHT orbit/lower frontal bone. No underlying fracture. CT CERVICAL SPINE FINDINGS Alignment: Within normal limits. No evidence of acute vertebral body subluxation. Skull base and vertebrae: No fracture line or displaced fracture fragment is seen. Soft tissues and spinal canal: No prevertebral fluid or swelling. No visible canal hematoma. Disc levels:  Disc spaces are well maintained throughout. Upper chest: Negative. Other: None. IMPRESSION: 1. No acute intracranial abnormality. No intracranial mass, hemorrhage or edema. No skull fracture. 2. No facial bone fracture or displacement. Mild soft tissue edema overlying the upper RIGHT orbit/lower frontal bone. 3. Lucency/defect at the base of the next to last RIGHT mandibular tooth, most likely  periapical dental abscess/carie, less likely acute traumatic dislodgement. 4. No fracture or acute subluxation within the cervical spine. Electronically Signed   By: Bary Richard M.D.   On: 03/25/2020 15:37   ECHOCARDIOGRAM COMPLETE  Result Date: 03/27/2020    ECHOCARDIOGRAM REPORT   Patient Name:   Abeer Kolbeck Date of Exam: 03/27/2020 Medical Rec #:  [id number]   Height:       69.0 in Accession #:    [id number]  Weight:       111.0 lb Date of Birth:  07/13/1961   BSA:          1.609 m Patient Age:    59 years    BP:           152/87 mmHg Patient Gender: F           HR:           80 bpm. Exam Location:  Inpatient Procedure: 2D Echo, Cardiac Doppler and Color Doppler Indications:    Syncope  History:        Patient has no prior history of Echocardiogram examinations.                 Signs/Symptoms:Syncope; Risk Factors:Hypotension. Medication                 noncompliance, Schizophrenia.  Sonographer:    Lavenia Atlas Referring Phys: [id number] TAYE T GONFA IMPRESSIONS  1. Left  ventricular ejection fraction, by estimation, is 60 to 65%. The left ventricle has normal function. The left ventricle has no regional wall motion abnormalities. Left ventricular diastolic parameters were normal.  2. Right ventricular systolic function is normal. The right ventricular size is normal. There is normal pulmonary artery systolic pressure.  3. Right atrial size was mildly dilated.  4. The mitral valve is normal in structure. Trivial mitral valve regurgitation. No evidence of mitral stenosis.  5. The aortic valve is normal in structure. Aortic valve regurgitation is not visualized. No aortic stenosis is present.  6. The inferior vena cava is normal in size with greater than 50% respiratory variability, suggesting right atrial pressure of 3 mmHg. FINDINGS  Left Ventricle: Left ventricular ejection fraction, by estimation, is 60 to 65%. The left ventricle has normal function. The left ventricle has no regional wall motion  abnormalities. The left ventricular internal cavity size was normal in size. There is  no left ventricular hypertrophy. Left ventricular diastolic parameters were normal. Right Ventricle: The right ventricular size is normal. No increase in right ventricular wall thickness. Right ventricular systolic function is normal. There is normal pulmonary artery systolic pressure. The tricuspid regurgitant velocity is 2.80 m/s, and  with an assumed right atrial pressure of 3 mmHg, the estimated right ventricular systolic pressure is 34.4 mmHg. Left Atrium: Left atrial size was normal in size. Right Atrium: Right atrial size was mildly dilated. Pericardium: There is no evidence of pericardial effusion. Mitral Valve: The mitral valve is normal in structure. There is mild thickening of the mitral valve leaflet(s). Trivial mitral valve regurgitation. No evidence of mitral valve stenosis. Tricuspid Valve: The tricuspid valve is normal in structure. Tricuspid valve regurgitation is not demonstrated. No evidence of tricuspid stenosis. Aortic Valve: The aortic valve is normal in structure. Aortic valve regurgitation is not visualized. No aortic stenosis is present. Pulmonic Valve: The pulmonic valve was normal in structure. Pulmonic valve regurgitation is not visualized. No evidence of pulmonic stenosis. Aorta: The aortic root is normal in size and structure. Venous: The inferior vena cava is normal in size with greater than 50% respiratory variability, suggesting right atrial pressure of 3 mmHg. IAS/Shunts: The interatrial septum was not well visualized.  LEFT VENTRICLE PLAX 2D LVIDd:         3.40 cm  Diastology LVIDs:         2.30 cm  LV e' medial:    11.50 cm/s LV PW:         1.00 cm  LV E/e' medial:  6.7 LV IVS:        1.00 cm  LV e' lateral:   12.50 cm/s LVOT diam:     1.60 cm  LV E/e' lateral: 6.2 LV SV:         47 LV SV Index:   29 LVOT Area:     2.01 cm  RIGHT VENTRICLE RV Basal diam:  3.10 cm RV S prime:     11.60 cm/s  TAPSE (M-mode): 2.4 cm LEFT ATRIUM             Index       RIGHT ATRIUM           Index LA diam:        2.70 cm 1.68 cm/m  RA Area:     13.30 cm LA Vol (A2C):   26.0 ml 16.16 ml/m RA Volume:   34.80 ml  21.63 ml/m LA Vol (A4C):   29.6 ml 18.40 ml/m  LA Biplane Vol: 30.4 ml 18.90 ml/m  AORTIC VALVE LVOT Vmax:   109.00 cm/s LVOT Vmean:  68.200 cm/s LVOT VTI:    0.232 m  AORTA Ao Root diam: 2.70 cm MITRAL VALVE               TRICUSPID VALVE MV Area (PHT): 2.50 cm    TR Peak grad:   31.4 mmHg MV Decel Time: 303 msec    TR Vmax:        280.00 cm/s MV E velocity: 77.60 cm/s MV A velocity: 54.80 cm/s  SHUNTS MV E/A ratio:  1.42        Systemic VTI:  0.23 m                            Systemic Diam: 1.60 cm Charlton Haws MD Electronically signed by Charlton Haws MD Signature Date/Time: 03/27/2020/11:28:27 AM    Final    CT Maxillofacial Wo Contrast  Result Date: 03/25/2020 CLINICAL DATA:  Fall, laceration under RIGHT eye. EXAM: CT HEAD WITHOUT CONTRAST CT MAXILLOFACIAL WITHOUT CONTRAST CT CERVICAL SPINE WITHOUT CONTRAST TECHNIQUE: Multidetector CT imaging of the head, cervical spine, and maxillofacial structures were performed using the standard protocol without intravenous contrast. Multiplanar CT image reconstructions of the cervical spine and maxillofacial structures were also generated. COMPARISON:  None. FINDINGS: CT HEAD FINDINGS Brain: Ventricles are within normal limits in size and configuration. There is no mass, hemorrhage, edema or other evidence of acute parenchymal abnormality. No extra-axial hemorrhage. Vascular: Chronic calcified atherosclerotic changes of the large vessels at the skull base. No unexpected hyperdense vessel. Skull: Normal. Negative for fracture or focal lesion. Other: None. CT MAXILLOFACIAL FINDINGS Osseous: Lower frontal bones are intact. No displaced nasal bone fracture. Osseous structures about the orbits are intact and normally aligned bilaterally. Walls of the maxillary sinuses are  intact and normally aligned. Bilateral zygomatic arches and pterygoid plates are intact. No mandible fracture or displacement is seen. Lucency/defect at the base of the next to last RIGHT mandibular tooth, most likely periapical dental abscess, less likely acute traumatic dislodgement. Orbits: Negative. No traumatic or inflammatory finding. Sinuses: Clear. Soft tissues: Mild soft tissue edema overlying the upper RIGHT orbit/lower frontal bone. No underlying fracture. CT CERVICAL SPINE FINDINGS Alignment: Within normal limits. No evidence of acute vertebral body subluxation. Skull base and vertebrae: No fracture line or displaced fracture fragment is seen. Soft tissues and spinal canal: No prevertebral fluid or swelling. No visible canal hematoma. Disc levels:  Disc spaces are well maintained throughout. Upper chest: Negative. Other: None. IMPRESSION: 1. No acute intracranial abnormality. No intracranial mass, hemorrhage or edema. No skull fracture. 2. No facial bone fracture or displacement. Mild soft tissue edema overlying the upper RIGHT orbit/lower frontal bone. 3. Lucency/defect at the base of the next to last RIGHT mandibular tooth, most likely periapical dental abscess/carie, less likely acute traumatic dislodgement. 4. No fracture or acute subluxation within the cervical spine. Electronically Signed   By: Bary Richard M.D.   On: 03/25/2020 15:37      Discharge Exam: Vitals:   03/31/20 1420 04/01/20 0540  BP: (!) 98/59 106/65  Pulse: 75 67  Resp: 18 16  Temp: 98.5 F (36.9 C) (!) 97.5 F (36.4 C)  SpO2: 99% 99%   Vitals:   03/31/20 0519 03/31/20 1414 03/31/20 1420 04/01/20 0540  BP: 109/63 (!) 85/53 (!) 98/59 106/65  Pulse: 76 74 75 67  Resp: 19 18 18  16  Temp: 98.2 F (36.8 C) 98.5 F (36.9 C) 98.5 F (36.9 C) (!) 97.5 F (36.4 C)  TempSrc:  Oral Oral Oral  SpO2: 98% 99% 99% 99%  Weight:      Height:        General: Pt is alert, awake, not in acute distress Cardiovascular:  RRR, S1/S2 +, no rubs, no gallops Respiratory: CTA bilaterally, no wheezing, no rhonchi Abdominal: Soft, NT, ND, bowel sounds + Extremities: no edema, no cyanosis    The results of significant diagnostics from this hospitalization (including imaging, microbiology, ancillary and laboratory) are listed below for reference.     Microbiology: Recent Results (from the past 240 hour(s))  Blood culture (routine x 2)     Status: None   Collection Time: 03/25/20  8:36 AM   Specimen: BLOOD RIGHT HAND  Result Value Ref Range Status   Specimen Description   Final    BLOOD RIGHT HAND Performed at [facility], [address] [place], [place] [address]    Special Requests   Final    BOTTLES DRAWN AEROBIC AND ANAEROBIC Blood Culture adequate volume Performed at [facility], [address] [place], [place] [address]    Culture   Final    NO GROWTH 5 DAYS Performed at [facility], [address] [place], [place] [address]    Report Status 03/31/2020 FINAL  Final  Blood culture (routine x 2)     Status: None   Collection Time: 03/25/20  2:07 PM   Specimen: BLOOD RIGHT HAND  Result Value Ref Range Status   Specimen Description   Final    BLOOD RIGHT HAND Performed at [facility], [address] [place], [place] [address]    Special Requests   Final    BOTTLES DRAWN AEROBIC AND ANAEROBIC Blood Culture adequate volume Performed at [facility], [address] [place], [place] [address]    Culture   Final    NO GROWTH 5 DAYS Performed at [facility], [address] [place], [place] [address]    Report Status 03/30/2020 FINAL  Final  Urine culture     Status: Abnormal   Collection Time: 03/25/20  4:08 PM   Specimen: Urine, Clean Catch  Result Value Ref Range Status   Specimen Description   Final    URINE, CLEAN CATCH Performed at [facility], [address]  [place], [place] [address]    Special Requests   Final    NONE Performed at [facility], [address] [place], [place] [address]    Culture (A)  Final    40,000 COLONIES/mL GROUP B STREP(S.AGALACTIAE)ISOLATED TESTING AGAINST S. AGALACTIAE NOT ROUTINELY PERFORMED DUE TO PREDICTABILITY OF AMP/PEN/VAN SUSCEPTIBILITY. Performed at [facility], [address] [place], [place] [address]    Report Status 03/26/2020 FINAL  Final  SARS CORONAVIRUS 2 (TAT 6-24 HRS) Nasopharyngeal Nasopharyngeal Swab     Status: None   Collection Time: 03/26/20 12:36 PM   Specimen: Nasopharyngeal Swab  Result Value Ref Range Status   SARS Coronavirus 2 NEGATIVE NEGATIVE Final    Comment: (NOTE) SARS-CoV-2 target nucleic acids are NOT DETECTED.  The SARS-CoV-2 RNA is generally detectable in upper and lower respiratory specimens during the acute phase of infection. Negative results do not preclude SARS-CoV-2 infection, do not rule out co-infections with other pathogens, and should not be used as the sole basis  for treatment or other patient management decisions. Negative results must be combined with clinical observations, patient history, and epidemiological information. The expected result is Negative.  Fact Sheet for Patients: HairSlick.no  Fact Sheet for Healthcare Providers: quierodirigir.com  This test is not yet approved or cleared by the [place] FDA and  has been authorized for detection and/or diagnosis of SARS-CoV-2 by FDA under an Emergency Use Authorization (EUA). This EUA will remain  in effect (meaning this test can be used) for the duration of the COVID-19 declaration under Se ction 564(b)(1) of the Act, 21 U.S.C. section 360bbb-3(b)(1), unless the authorization is terminated or revoked sooner.  Performed at [facility], [address], [place], [place] [address]      Labs: BNP (last 3  results) No results for input(s): BNP in the last 8760 hours. Basic Metabolic Panel: Recent Labs  Lab 03/26/20 0836 03/27/20 0519 03/28/20 0549 03/29/20 0513 03/30/20 0543  NA 141 142 138 140 139  K 2.8* 3.6 3.9 3.5 4.0  CL 107 106 107 105 103  CO2 GLUCOSE 108* 97 102* 100* 91  BUN 5* <5* CREATININE 0.62 0.55 0.78 0.95 0.58  CALCIUM 7.4* 8.7* 7.9* 8.2* 8.5*  MG 1.9 2.0 2.2 2.3 2.3  PHOS 1.6* 3.1 3.4 3.0 3.8   Liver Function Tests: Recent Labs  Lab 03/25/20 1406 03/25/20 1406 03/26/20 0836 03/27/20 0519 03/28/20 0549 03/29/20 0513 03/30/20 0543  AST 16  --  16  --   --   --   --   ALT 7  --  8  --   --   --   --   ALKPHOS 39  --  41  --   --   --   --   BILITOT 0.4  --  0.3  --   --   --   --   PROT 4.8*  --  5.2*  --   --   --   --   ALBUMIN 2.5*   < > 2.8* 3.1* 2.5* 2.6* 2.8*   < > = values in this interval not displayed.   No results for input(s): LIPASE, AMYLASE in the last 168 hours. No results for input(s): AMMONIA in the last 168 hours. CBC: Recent Labs  Lab 03/25/20 1406 03/26/20 0004 03/30/20 0543 03/31/20 0639  WBC 4.3 5.0 6.2 5.0  NEUTROABS 2.6 3.0  --   --   HGB 9.6* 12.5 10.7* 10.4*  HCT 29.7* 38.6 33.6* 32.4*  MCV 88.1 87.7 88.2 88.3  PLT 125* 175 161 194   Cardiac Enzymes: No results for input(s): CKTOTAL, CKMB, CKMBINDEX, TROPONINI in the last 168 hours. BNP: Invalid input(s): POCBNP CBG: Recent Labs  Lab 03/25/20 1712  GLUCAP 94   D-Dimer No results for input(s): DDIMER in the last 72 hours. Hgb A1c No results for input(s): HGBA1C in the last 72 hours. Lipid Profile No results for input(s): CHOL, HDL, LDLCALC, TRIG, CHOLHDL, LDLDIRECT in the last 72 hours. Thyroid function studies No results for input(s): TSH, T4TOTAL, T3FREE, THYROIDAB in the last 72 hours.  Invalid input(s): FREET3 Anemia work up Recent Labs    03/31/20 0639  VITAMINB12 732  FOLATE 5.6*  FERRITIN 160  TIBC 275  IRON 60   RETICCTPCT 1.6   Urinalysis    Component Value Date/Time   COLORURINE STRAW (A) 03/25/2020 1608   APPEARANCEUR CLEAR 03/25/2020 1608   LABSPEC 1.004 (L) 03/25/2020 1608  PHURINE 6.0 03/25/2020 1608   GLUCOSEU NEGATIVE 03/25/2020 1608   HGBUR SMALL (A) 03/25/2020 1608   BILIRUBINUR NEGATIVE 03/25/2020 1608   KETONESUR 5 (A) 03/25/2020 1608   PROTEINUR NEGATIVE 03/25/2020 1608   UROBILINOGEN 0.2 04/10/2015 1111   NITRITE NEGATIVE 03/25/2020 1608   LEUKOCYTESUR NEGATIVE 03/25/2020 1608   Sepsis Labs Invalid input(s): PROCALCITONIN,  WBC,  LACTICIDVEN Microbiology Recent Results (from the past 240 hour(s))  Blood culture (routine x 2)     Status: None   Collection Time: 03/25/20  8:36 AM   Specimen: BLOOD RIGHT HAND  Result Value Ref Range Status   Specimen Description   Final    BLOOD RIGHT HAND Performed at [facility], [address], [place], [place] [address]    Special Requests   Final    BOTTLES DRAWN AEROBIC AND ANAEROBIC Blood Culture adequate volume Performed at [facility], [address] [place], [place] [address]    Culture   Final    NO GROWTH 5 DAYS Performed at [facility], [address], [place], [place] [address]    Report Status 03/31/2020 FINAL  Final  Blood culture (routine x 2)     Status: None   Collection Time: 03/25/20  2:07 PM   Specimen: BLOOD RIGHT HAND  Result Value Ref Range Status   Specimen Description   Final    BLOOD RIGHT HAND Performed at [facility], [address] [place], [place] [address]    Special Requests   Final    BOTTLES DRAWN AEROBIC AND ANAEROBIC Blood Culture adequate volume Performed at [facility], [address], [place], [place] [address]    Culture   Final    NO GROWTH 5 DAYS Performed at [facility], [address], [place], [place] [address]    Report Status 03/30/2020 FINAL  Final  Urine culture     Status:  Abnormal   Collection Time: 03/25/20  4:08 PM   Specimen: Urine, Clean Catch  Result Value Ref Range Status   Specimen Description   Final    URINE, CLEAN CATCH Performed at [facility], [address] [place], [place] [address]    Special Requests   Final    NONE Performed at [facility], [address] [place], [place] [address]    Culture (A)  Final    40,000 COLONIES/mL GROUP B STREP(S.AGALACTIAE)ISOLATED TESTING AGAINST S. AGALACTIAE NOT ROUTINELY PERFORMED DUE TO PREDICTABILITY OF AMP/PEN/VAN SUSCEPTIBILITY. Performed at [facility], [address] [place], [place] [address]    Report Status 03/26/2020 FINAL  Final  SARS CORONAVIRUS 2 (TAT 6-24 HRS) Nasopharyngeal Nasopharyngeal Swab     Status: None   Collection Time: 03/26/20 12:36 PM   Specimen: Nasopharyngeal Swab  Result Value Ref Range Status   SARS Coronavirus 2 NEGATIVE NEGATIVE Final    Comment: (NOTE) SARS-CoV-2 target nucleic acids are NOT DETECTED.  The SARS-CoV-2 RNA is generally detectable in upper and lower respiratory specimens during the acute phase of infection. Negative results do not preclude SARS-CoV-2 infection, do not rule out co-infections with other pathogens, and should not be used as the sole basis for treatment or other patient management decisions. Negative results must be combined with clinical observations, patient history, and epidemiological information. The expected result is Negative.  Fact Sheet for Patients: HairSlick.no  Fact Sheet for Healthcare Providers: quierodirigir.com  This test is not yet approved  or cleared by the [place] and  has been authorized for detection and/or diagnosis of SARS-CoV-2 by FDA under an Emergency Use Authorization (EUA). This EUA will remain  in effect (meaning this test can be used) for the duration of the COVID-19 declaration under Se  ction 564(b)(1) of the Act, 21 U.S.C. section 360bbb-3(b)(1), unless the authorization is terminated or revoked sooner.  Performed at [facility], [address], [place], [place] [address]      Time coordinating discharge: Over 30 minutes  SIGNED:   Hughie Closs, MD  [facility] 04/01/2020, 7:53 AM  If 7PM-7AM, please contact night-coverage [email or web address]

## 2020-04-01 NOTE — Discharge Instructions (Signed)
Schizophrenia Schizophrenia is a mental illness. It may cause disturbed or disorganized thinking, speech, or behavior. People with schizophrenia have problems functioning in one or more areas of life. People with schizophrenia are at increased risk for suicide, certain long-term (chronic) physical illnesses, and unhealthy behaviors, such as smoking and drug use. People who have family members with schizophrenia are at higher risk of developing the illness. Schizophrenia affects men and women equally, but it usually appears at an earlier age (teenage or early adult years) in men. What are the causes? The cause of this condition is not known. What increases the risk? The following factors may make you more likely to develop this condition:  Having a family member who has schizophrenia. Some gene combinations may increase the risk, but there is no single gene that causes schizophrenia.  Impaired brain or neurotransmitter development or chemistry. Neurotransmitters are chemicals in the brain. What are the signs or symptoms? The earliest symptoms are often subtle and may go unnoticed until the illness becomes more severe (first-break psychosis). Symptoms of schizophrenia may be ongoing (continuous) or may come and go in severity. Episodes are often triggered by major life events, such as:  Family stress.  College.  Military service.  Marriage.  Pregnancy or childbirth.  Divorce.  Loss of a loved one. Symptoms may include:  Seeing, hearing, or feeling things that do not exist (hallucinations).  Having false beliefs (delusions). Delusions often involve beliefs that you are being attacked, harassed, cheated, persecuted, or conspired against (persecutory delusions).  Speech that does not make sense to others or is hard to understand (incoherent).  Behavior that is odd, confused, unfocused, withdrawn, or disorganized.  Extremely overactive or underactive motor activity (catatonia). Motor  activity is any action that involves the muscles.  Bland or blunted emotions (flat affect).  Loss of will power (avolition).  Withdrawal from social contacts (social isolation). Symptoms may affect the level of functioning in one or more major areas of life, such as work, school, relationships, or self-care. How is this diagnosed? Schizophrenia is diagnosed through an assessment by a mental health care provider.  Your mental health care provider may ask questions about: ? Your thoughts, behavior, and mood. ? Your ability to function in daily life. ? Your medical history. ? Any use of alcohol or drugs, including prescription medicines.  You may have blood tests and imaging exams. How is this treated? Schizophrenia is a chronic illness that is best controlled with continuous treatment rather than treatment only when symptoms occur. The following treatments are used to manage schizophrenia:  Medicine. This is the most effective and important form of treatment for schizophrenia. Antipsychotic medicines are usually prescribed to help manage schizophrenia. Other types of medicine may be added to relieve any symptoms that may occur despite the use of antipsychotic medicines.  Counseling or talk therapy. Individual, group, or family counseling may be helpful in providing education, support, and guidance. Many people also benefit from social skills and job skills (vocational) training. A combination of medicine and counseling is best for managing the disorder over time. A procedure in which electricity is applied to the brain through the scalp (electroconvulsive therapy) may be used to treat catatonic schizophrenia or schizophrenia in people who cannot take medicine or do not respond to medicine and counseling. Follow these instructions at home:   Keep stress under control. Stress may trigger psychosis and make symptoms worse.  Try to get as much sleep as you can.  Avoid alcohol and drugs.    They can affect how medicine works and make symptoms worse.  Surround yourself with people who care about you and can help you manage your condition.  Take over-the-counter and prescription medicines only as told by your health care provider.  Keep all follow-up visits as told by your health care provider and counselor. This is important. Contact a health care provider if:  You have a bad response to changes in medicines or to your treatment plan.  You have trouble falling sleep.  You have a low mood that will not go away.  You are using: ? Drugs. ? Too much caffeine. ? Tobacco products. ? Alcohol. Get help right away if:  You feel out of control.  You or others notice warning signs of suicide such as: ? Increased use of drugs or alcohol ? Expressing feelings of not having a purpose in life, being trapped, guilty, anxious and agitated, or hopeless. ? Withdrawing from friends and family. ? Showing uncontrolled anger, recklessness, and dramatic mood changes. ? Talking about suicide, discussing or searching for methods. If you ever feel like you may hurt yourself or others, or have thoughts about taking your own life, get help right away. You can go to your nearest emergency department or call:  Your local emergency services (911 in the [place]  A suicide crisis helpline, such as the National Suicide Prevention Lifeline at 1-800-273-8255. This is open 24 hours a day. Summary  Schizophrenia is a mental illness that causes disturbed or disorganized thinking, speech, or behavior.  Symptoms of schizophrenia may be ongoing or may come and go. They are often triggered by major life events.  Keep stress under control. Stress may trigger psychosis and make symptoms worse.  Avoid alcohol and drugs. They can affect how medicine works and make symptoms worse.  Get help right away if you feel out of control. This information is not intended to replace advice given to you by your health  care provider. Make sure you discuss any questions you have with your health care provider. Document Revised: 06/18/2017 Document Reviewed: 04/17/2016 Elsevier Patient Education  2020 Elsevier Inc.  

## 2020-04-01 NOTE — Tx Team (Signed)
Initial Treatment Plan 04/01/2020 6:52 PM [name] [id number]    PATIENT STRESSORS: Health problems Marital or family conflict Medication change or noncompliance   PATIENT STRENGTHS: Average or above average intelligence Supportive family/friends   PATIENT IDENTIFIED PROBLEMS: Depression  Paranoia  Medication noncompliance  Ineffective coping skills               DISCHARGE CRITERIA:  Medical problems require only outpatient monitoring Safe-care adequate arrangements made  PRELIMINARY DISCHARGE PLAN: Attend aftercare/continuing care group Outpatient therapy Placement in alternative living arrangements Return to previous living arrangement  PATIENT/FAMILY INVOLVEMENT: This treatment plan has been presented to and reviewed with the patient, [name], and/or family member.  The patient and family have been given the opportunity to ask questions and make suggestions.  Clarene Critchley, RN 04/01/2020, 6:52 PM

## 2020-04-01 NOTE — TOC Transition Note (Signed)
Transition of Care [facility]) - CM/SW Discharge Note   Patient Details  Name: [name] MRN: [id number] Date of Birth: 1960-12-17  [facility] CM/SW Contact:  Darleene Cleaver, LCSW Phone Number: 04/01/2020, 5:08 PM   Clinical Narrative:     CSW was informed that patient is still under IVC.  CSW updated IVC paperwork, patient was served, and will be transported by [employer]. Patient to be d/c'ed today to [facility].  Patient and family agreeable to plans will transport via sheriff's department RN to call report to [phone].     Final next level of care: Psychiatric Hospital Barriers to Discharge: Barriers Resolved   Patient Goals and CMS Choice Patient states their goals for this hospitalization and ongoing recovery are:: To return to [facility] and eventually transfer to [facility]. CMS Medicare.gov Compare Post Acute Care list provided to:: Patient Represenative (must comment)    Discharge Placement                Patient to be transferred to facility by: [facility] department      Discharge Plan and Services                  DME Agency: NA                  Social Determinants of Health (SDOH) Interventions     Readmission Risk Interventions No flowsheet data found.

## 2020-04-01 NOTE — Progress Notes (Signed)
Pt has kept to herself this evening, pt agreed to take medication PO     04/01/20 2100  Psych Admission Type (Psych Patients Only)  Admission Status Involuntary  Psychosocial Assessment  Patient Complaints Anxiety  Eye Contact Fair  Facial Expression Sad  Affect Sad;Preoccupied  Speech Soft;Slow  Interaction Avoidant  Motor Activity Slow  Appearance/Hygiene Disheveled  Behavior Characteristics Calm  Mood Suspicious  Aggressive Behavior  Effect No apparent injury  Thought Process  Coherency Blocking  Content Blaming others  Delusions Paranoid  Perception Hallucinations  Hallucination Auditory  Judgment Impaired  Confusion WDL  Danger to Self  Current suicidal ideation? Denies  Danger to Others  Danger to Others None reported or observed

## 2020-04-02 DIAGNOSIS — F200 Paranoid schizophrenia: Principal | ICD-10-CM

## 2020-04-02 NOTE — Progress Notes (Signed)
   04/02/20 1200  Psych Admission Type (Psych Patients Only)  Admission Status Involuntary  Psychosocial Assessment  Patient Complaints Depression  Eye Contact Fair  Facial Expression Sad  Affect Sad;Preoccupied  Speech Soft;Slow  Interaction Avoidant  Motor Activity Slow  Appearance/Hygiene Disheveled  Behavior Characteristics Cooperative  Mood Depressed;Suspicious  Aggressive Behavior  Effect No apparent injury  Thought Process  Coherency Blocking  Content Blaming others  Delusions Paranoid  Perception Hallucinations  Hallucination Auditory  Judgment Impaired  Confusion WDL  Danger to Self  Current suicidal ideation? Denies  Danger to Others  Danger to Others None reported or observed  Dar Note: Patient presents with a flat affect and depressed mood.  Denies suicidal thoughts, auditory and visual hallucinations.  Took all prescribed medication.  Food and fluid intake poor.  Routine safety checks maintained every 15 minutes.  Patient is safe on the unit.

## 2020-04-02 NOTE — Progress Notes (Signed)
NUTRITION ASSESSMENT  Pt identified as at risk on the Malnutrition Screen Tool  INTERVENTION: 1. Supplements: Boost Breeze po TID-QID, each supplement provides 250 kcal and 9 grams of protein 2. Multivitamin with minerals daily 3. Pre-packaged items if available  NUTRITION DIAGNOSIS: Unintentional weight loss related to sub-optimal intake as evidenced by pt report.   Goal: Pt to meet >/= 90% of their estimated nutrition needs.  Monitor:  PO intake  Assessment:  Pt discharged to [facility] on 9/13 from [facility]. Pt familiar to RD from that admission. Pt was diagnosed with moderate malnutrition on 9/8. Pt likes Boost Breeze supplements, recommend TID to QID. Supplements and meal/snack options likely to better accepted if pre-packaged and opened in front of patient.    Height: Ht Readings from Last 1 Encounters:  04/01/20 5\' 4"  (1.626 m)    Weight: Wt Readings from Last 1 Encounters:  04/02/20 53.5 kg    Weight Hx: Wt Readings from Last 10 Encounters:  04/02/20 53.5 kg  03/26/20 50.3 kg  03/25/20 50.3 kg  11/17/17 68 kg  04/12/15 64 kg  11/06/11 58.1 kg    BMI:  Body mass index is 20.25 kg/m. Pt meets criteria for normal based on current BMI.  Estimated Nutritional Needs: Kcal: 30-35 kcal/kg Protein: > 1.5 gram protein/kg Fluid: 1 ml/kcal  Diet Order:  Diet Order            Diet regular Room service appropriate? Yes; Fluid consistency: Thin  Diet effective now                Pt is also offered choice of unit snacks mid-morning and mid-afternoon.    Lab results and medications reviewed.   11/08/11, MS, RD, LDN Inpatient Clinical Dietitian Contact information available via Amion

## 2020-04-02 NOTE — H&P (Signed)
Psychiatric Admission Assessment Adult  Patient Identification: [name] MRN:  [id number] Date of Evaluation:  04/02/2020 Chief Complaint:  Schizophrenia (HCC) [F20.9] Principal Diagnosis: <principal problem not specified> Diagnosis:  Active Problems:   Schizophrenia (HCC)  History of Present Illness: Patient is seen and examined.  Patient is a 59 year old female with a past psychiatric history significant for schizophrenia who was originally admitted to the behavioral health hospital from the [facility] emergency department on 03/02/2020.  She had originally presented there on 8/12 under involuntary commitment.  She was placed under involuntary commitment secondary to reportedly having auditory and visual hallucinations and had been violent.  The patient has a longstanding history of schizophrenia, and had actually been hospitalized [facility] in the past.  She had previously been prescribed haloperidol as well as the haloperidol decanoate long-acting injection.  She was brought to the hospital with the intention of starting her on the long-acting paliperidone injection.  She was given that.  Unfortunately during the early part of the hospitalization she remains significantly paranoid, and her paranoia led to such a degree that she refused to eat.  She was titrated on several antipsychotics which had no effect on her paranoia and as well continue to have problems with her paranoid induced anorexia.  Because of her lack of p.o. intake she was sent to the [facility] previously on 2 occasions.  She received IV fluids, and then was returned to our unit.  She also had some significant hypokalemia.  She was once again returned to the [facility] emergency department secondary to  hypotension as well as poor p.o. intake on 03/25/2020.  She required medical hospitalization at that time.  She was hypotensive with systolic blood pressures  in the 60s.  Her urinalysis showed ketones and showed no evidence of infection.  Her psychiatric medications were continued, and she was given IV fluids and she did take liquid supplemental protein during the course of her hospitalization there.  She was returned to our facility on 04/01/2020.  Her weight was up at 53 kg.  On examination today she remains paranoid, she is still paranoid about her food, but has taken the boost supplements.  She received a total of Haldol Decanoate 200 mg IM approximately on 03/17/2020.  She received the long-acting paliperidone injection on 03/02/2020.  These medications including Risperdal, Prolixin, oral Haldol and Caplyta have had essentially no effect on her paranoia towards food.  After she was stabilized [facility] she was transferred back to our facility for continued care.  On examination this morning she appears to be essentially unchanged.  She is paranoid, irritable but nursing does state that she has been drinking boost.  Her vital signs are stable this morning.  Her blood pressure is 98/62, pulse was 85.  Her most recent weight this a.m. was 53.524 kg and her BMI was 20.24.  She slept 6.5 hours.  Associated Signs/Symptoms: Depression Symptoms:  anhedonia, insomnia, psychomotor retardation, fatigue, anxiety, disturbed sleep, weight loss, decreased appetite, (Hypo) Manic Symptoms:  Delusions, Hallucinations, Impulsivity, Irritable Mood, Anxiety Symptoms:  Excessive Worry, Psychotic Symptoms:  Delusions, Hallucinations: Auditory Paranoia, PTSD Symptoms: Negative Total Time spent with patient: 45 minutes  Past Psychiatric History: Patient has had multiple psychiatric hospitalizations in the past.  [facility] secondary to similar circumstances.  She had been hospitalized apparently at an outside facility [facility] for 4 weeks prior to being transferred  to [facility].  Is the  patient at risk to self? Yes.    Has the patient been a risk to self in the past 6 months? Yes.    Has the patient been a risk to self within the distant past? Yes.    Is the patient a risk to others? No.  Has the patient been a risk to others in the past 6 months? No.  Has the patient been a risk to others within the distant past? No.   Prior Inpatient Therapy:   Prior Outpatient Therapy:    Alcohol Screening: 1. How often do you have a drink containing alcohol?: Never 2. How many drinks containing alcohol do you have on a typical day when you are drinking?: 1 or 2 3. How often do you have six or more drinks on one occasion?: Never AUDIT-C Score: 0 4. How often during the last year have you found that you were not able to stop drinking once you had started?: Never 5. How often during the last year have you failed to do what was normally expected from you because of drinking?: Never 6. How often during the last year have you needed a first drink in the morning to get yourself going after a heavy drinking session?: Never 7. How often during the last year have you had a feeling of guilt of remorse after drinking?: Never 8. How often during the last year have you been unable to remember what happened the night before because you had been drinking?: Never 9. Have you or someone else been injured as a result of your drinking?: No 10. Has a relative or friend or a doctor or another health worker been concerned about your drinking or suggested you cut down?: No Alcohol Use Disorder Identification Test Final Score (AUDIT): 0 Substance Abuse History in the last 12 months:  No. Consequences of Substance Abuse: Negative Previous Psychotropic Medications: Yes  Psychological Evaluations: Yes  Past Medical History:  Past Medical History:  Diagnosis Date  . Non compliance w medication regimen   . Schizophrenia (HCC)    History reviewed. No pertinent surgical history. Family History: History  reviewed. No pertinent family history. Family Psychiatric  History: Noncontributory Tobacco Screening:   Social History:  Social History   Substance and Sexual Activity  Alcohol Use Yes   Comment: Refuses to disclose how much     Social History   Substance and Sexual Activity  Drug Use No   Comment: Refuses to answer    Additional Social History:                           Allergies:   Allergies  Allergen Reactions  . Aspirin Other (See Comments)    Possibly caused hives per sister  . Geodon [Ziprasidone Hcl] Other (See Comments)    Caused twitching   Lab Results: No results found for this or any previous visit (from the past 48 hour(s)).  Blood Alcohol level:  Lab Results  Component Value Date   [facility] <10 02/29/2020   ETH <10 10/04/2019    Metabolic Disorder Labs:  Lab Results  Component Value Date   HGBA1C 5.7 (H) 03/14/2020   MPG 116.89 03/14/2020   MPG 128 04/16/2015   Lab Results  Component Value Date   PROLACTIN 13.6 04/16/2015   Lab Results  Component Value Date   CHOL 186 04/16/2015   TRIG 64 04/16/2015   HDL 55 04/16/2015  CHOLHDL 3.4 04/16/2015   VLDL 13 04/16/2015   LDLCALC 118 (H) 04/16/2015    Current Medications: Current Facility-Administered Medications  Medication Dose Route Frequency Provider Last Rate Last Admin  . acetaminophen (TYLENOL) tablet 650 mg  650 mg Oral Q6H PRN Antonieta Pert, MD      . alum & mag hydroxide-simeth (MAALOX/MYLANTA) 200-200-20 MG/5ML suspension 30 mL  30 mL Oral Q4H PRN Antonieta Pert, MD      . benztropine (COGENTIN) tablet 0.5 mg  0.5 mg Oral BID PRN Antonieta Pert, MD      . dronabinol (MARINOL) capsule 2.5 mg  2.5 mg Oral BID AC Carey Johndrow, Marlane Mingle, MD      . feeding supplement (BOOST / RESOURCE BREEZE) liquid 1 Container  1 Container Oral TID BM Antonieta Pert, MD      . geriatric multivitamins-minerals (ELDERTONIC/GEVRABON) liquid 15 mL  15 mL Oral Daily Antonieta Pert, MD       . haloperidol (HALDOL) 2 MG/ML solution 5 mg  5 mg Oral TID Antonieta Pert, MD   5 mg at 04/02/20 0816  . hydrOXYzine (ATARAX/VISTARIL) tablet 25 mg  25 mg Oral TID PRN Antonieta Pert, MD      . Lumateperone Tosylate CAPS 42 mg  42 mg Oral Daily Antonieta Pert, MD      . magnesium hydroxide (MILK OF MAGNESIA) suspension 30 mL  30 mL Oral Daily PRN Antonieta Pert, MD      . megestrol (MEGACE) tablet 40 mg  40 mg Oral QID Antonieta Pert, MD   40 mg at 04/02/20 0816  . mirtazapine (REMERON) tablet 15 mg  15 mg Oral QHS Antonieta Pert, MD   15 mg at 04/01/20 2038  . traZODone (DESYREL) tablet 50 mg  50 mg Oral QHS PRN Antonieta Pert, MD       PTA Medications: Medications Prior to Admission  Medication Sig Dispense Refill Last Dose  . Acetaminophen (TYLENOL PO) Take 650 mg by mouth every 6 (six) hours as needed.     Marland Kitchen aluminum-magnesium hydroxide-simethicone (MAALOX) 200-200-20 MG/5ML SUSP Take 30 mLs by mouth every 4 (four) hours as needed.     . benztropine (COGENTIN) 0.5 MG tablet Take 1 tablet (0.5 mg total) by mouth 2 (two) times daily as needed for tremors.     Marland Kitchen dronabinol (MARINOL) 5 MG capsule Take 5 mg by mouth 2 (two) times daily before a meal.     . haloperidol (HALDOL) 10 MG tablet Take 1 tablet (10 mg total) by mouth 3 (three) times daily. (Patient taking differently: Take 5 mg by mouth every 6 (six) hours as needed. )     . hydrOXYzine (ATARAX/VISTARIL) 25 MG tablet Take 1 tablet (25 mg total) by mouth 3 (three) times daily as needed for anxiety. 30 tablet 0   . Lumateperone Tosylate 42 MG CAPS Take 42 mg by mouth daily.     . megestrol (MEGACE) 40 MG tablet Take 1 tablet (40 mg total) by mouth daily.     . mirtazapine (REMERON) 30 MG tablet Take 30 mg by mouth at bedtime.     . Multiple Vitamins-Minerals (MULTIVITAMIN WITH MINERALS) tablet Take 1 tablet by mouth daily.     . Nutritional Supplements (BOOST PO) Take 1 Container by mouth 2 (two) times  daily between meals.     . paliperidone (INVEGA) 6 MG 24 hr tablet Take 1 tablet (6 mg total) by mouth  at bedtime.     . traZODone (DESYREL) 50 MG tablet Take 1 tablet (50 mg total) by mouth at bedtime.       Musculoskeletal: Strength & Muscle Tone: within normal limits Gait & Station: normal Patient leans: [name]  Psychiatric Specialty Exam: Physical Exam Vitals and nursing note reviewed.  Constitutional:      Appearance: Normal appearance.  HENT:     Head: Normocephalic and atraumatic.  Pulmonary:     Effort: Pulmonary effort is normal.  Neurological:     General: No focal deficit present.     Mental Status: She is alert and oriented to person, place, and time.     Review of Systems  Blood pressure 98/62, pulse 85, temperature 99 F (37.2 C), temperature source Oral, resp. rate 18, height 5\' 4"  (1.626 m), weight 53.5 kg, SpO2 99 %.Body mass index is 20.25 kg/m.  General Appearance: Disheveled  Eye Contact:  Minimal  Speech:  Normal Rate  Volume:  Decreased  Mood:  Dysphoric and Irritable  Affect:  Congruent  Thought Process:  Goal Directed and Descriptions of Associations: Loose  Orientation:  Full (Time, Place, and Person)  Thought Content:  Delusions and Paranoid Ideation  Suicidal Thoughts:  No  Homicidal Thoughts:  No  Memory:  Immediate;   Poor Recent;   Poor Remote;   Poor  Judgement:  Impaired  Insight:  Lacking  Psychomotor Activity:  Decreased  Concentration:  Concentration: Fair and Attention Span: Fair  Recall:  of Knowledge:  Poor  Language:  Good  Akathisia:  Negative  Handed:  Right  AIMS (if indicated):     Assets:  Desire for Improvement Resilience Social Support  ADL's:  Impaired  Cognition:  WNL  Sleep:  Number of Hours: 6.5    Treatment Plan Summary: Daily contact with patient to assess and evaluate symptoms and progress in treatment, Medication management and Plan : Patient is seen and examined.  Patient is a 59 year old  female with the above-stated past psychiatric history who was transferred back to our facility for continued psychiatric care.  She will be admitted to the hospital.  She will be integrated in the milieu.  She will be encouraged to attend groups.  We will continue her Cogentin, Marinol, Haldol, hydroxyzine, Caplyta, Megace, mirtazapine and trazodone.  We will continue with daily weights and monitoring of her blood pressure.  We will also continue to attempt to transfer [facility] to [facility] given the intractable nature of her illness.  From early on in the hospitalization her primary caretaker (her sister) is undergoing cancer treatment right now, and is limited in the ability to confirm whether or not the patient is compliant with medications as well as eating and drinking adequately.  Her laboratories from 9/11 and 9/12 showed a creatinine of 0.58.  Her blood sugar was 116.  Liver function enzymes were normal.  Her prealbumin was 7.7.  Normal is 18-38.  Her folic acid is low at 5.6, she is mildly anemic with a hemoglobin of 10.4 and hematocrit of 32.4.  White blood cell count is normal at 5.  Her platelets are low normal at 194,000.  This is actually higher than it was 7 days ago.  An endocrine work-up for adrenal insufficiency while in the medical hospital was essentially negative.  Urinalysis was negative for white blood cells or bacteria.  Fecal occult blood was negative.  There is also some mention in the chart that the patient is  a veteran and has been followed at least in the past and the [employer] system.  I will have social work contact them for collateral information as well.  We will attempt to contact her sister to see if her health issues are any more stable at this point.  Previously she believed that her family had been substituted by others (Capgras syndrome).  Observation Level/Precautions:  15 minute checks  Laboratory:  Chemistry Profile  Psychotherapy:     Medications:    Consultations:    Discharge Concerns:    Estimated LOS:  Other:     Physician Treatment Plan for Primary Diagnosis: <principal problem not specified> Long Term Goal(s): Improvement in symptoms so as ready for discharge  Short Term Goals: Ability to identify changes in lifestyle to reduce recurrence of condition will improve, Ability to verbalize feelings will improve, Ability to demonstrate self-control will improve, Ability to identify and develop effective coping behaviors will improve, Ability to maintain clinical measurements within normal limits will improve and Compliance with prescribed medications will improve  Physician Treatment Plan for Secondary Diagnosis: Active Problems:   Schizophrenia (HCC)  Long Term Goal(s): Improvement in symptoms so as ready for discharge  Short Term Goals: Ability to identify changes in lifestyle to reduce recurrence of condition will improve, Ability to verbalize feelings will improve, Ability to demonstrate self-control will improve, Ability to identify and develop effective coping behaviors will improve, Ability to maintain clinical measurements within normal limits will improve and Compliance with prescribed medications will improve  I certify that inpatient services furnished can reasonably be expected to improve the patient's condition.    Antonieta Pert, MD 9/14/202110:25 AM

## 2020-04-02 NOTE — [facility] Counselor (Signed)
CSW faxed updated records to [facility] for review.    Ruthann Cancer MSW, LCSW Clincal Social Worker  [facility]

## 2020-04-02 NOTE — H&P (Signed)
Psychiatric Admission Assessment Adult  Patient Identification: [name] MRN:  [id number] Date of Evaluation:  04/02/2020 Chief Complaint:  Schizophrenia (HCC) [F20.9] Principal Diagnosis: <principal problem not specified> Diagnosis:  Active Problems:   Schizophrenia (HCC)  History of Present Illness: Pt is a 59 year old with a past history significant of Schizophrenia and refusal to eat secondary to paranoia admitted to [facility] from ER. Pt was transferred from [facility] to ER for IV fluids 03/25/20. Pt was originally admitted to [facility] from [place] [facility] ED on 03/02/2020 under IVC commitment for being violent and having auditory and visual hallucinations. Pt was admitted to [facility] in the past and received Haldol Decanoate long acting injection. She was really paranoid during her initial admission to [facility] and she refused to eat. She was transferred to ER two times where she received IV fluids and then transferred back to [facility]. We transferred her again to [facility] ED due to refusal to eat and hypotension (BP in 60's) on 03/25/2020. In the ER she was given IV fluids, liquid supplemental protein and K supplement. She returned to [facility] on 04/01/2020. Her wt has increased to 53.5 kg from 50.3 kg on 03/25/2020. Pt received 2 doses of  Haldol Decanoate 100 mg on 03/11/2020 and 2nd one on 03/15/2020. Pt also received  Invega sustenna 234 mg on 03/02/2020. Currently, Pt denies any suicidal ideation, homicidal ideation, visual and auditory hallucination. Pt states " I want to go home, I don't know why I am here". Pt states she ate food at [place] [facility] because the food there has more nutritional value than here. Pt states she has been taking her medications here. Nurse confirmed that Pt drank Gatorade only and didn't eat anything. She states she can cook food at home and will eat. Pt endorses low mood and decreased energy but denies any hopelessness, worthlessness, helplessness, decreased concentration and guilt. She denies use of  any drugs. She denies drinking alcohol. Pt denies racing thoughts, irritability, high self-steam, decreased need for sleep and unusual powers. She denies access to guns. On Examination, She is still irritable and paranoid. Her speech is normal with normal rate. Her mood is dysphoric and irritable and affect is flat. Delusions, paranoid ideations  and delusions are present. NO SI, HI and AVH. Associated Signs/Symptoms: Depression Symptoms:  depressed mood, psychomotor retardation, fatigue, loss of energy/fatigue, (Hypo) Manic Symptoms:  Irritable Mood, Anxiety Symptoms:  None Psychotic Symptoms:  Paranoia, PTSD Symptoms: Negative Total Time spent with patient: 45 minutes  Past Psychiatric History: Paranoid Schizophrenia  Is the patient at risk to self? No.  Has the patient been a risk to self in the past 6 months? No.  Has the patient been a risk to self within the distant past? No.  Is the patient a risk to others? No.  Has the patient been a risk to others in the past 6 months? No.  Has the patient been a risk to others within the distant past? No.   Prior Inpatient Therapy:   Prior Outpatient Therapy:    Alcohol Screening: 1. How often do you have a drink containing alcohol?: Never 2. How many drinks containing alcohol do you have on a typical day when you are drinking?: 1 or 2 3. How often do you have six or more drinks on one occasion?: Never AUDIT-C Score: 0 4. How often during the last year have you found that you were not able to stop drinking once you had started?: Never 5. How often during  the last year have you failed to do what was normally expected from you because of drinking?: Never 6. How often during the last year have you needed a first drink in the morning to get yourself going after a heavy drinking session?: Never 7. How often during the last year have you had a feeling of guilt of remorse after drinking?: Never 8. How often during the last year have you been  unable to remember what happened the night before because you had been drinking?: Never 9. Have you or someone else been injured as a result of your drinking?: No 10. Has a relative or friend or a doctor or another health worker been concerned about your drinking or suggested you cut down?: No Alcohol Use Disorder Identification Test Final Score (AUDIT): 0 Substance Abuse History in the last 12 months:  No. Consequences of Substance Abuse: Negative Previous Psychotropic Medications: Yes  Psychological Evaluations: Yes  Past Medical History:  Past Medical History:  Diagnosis Date  . Non compliance w medication regimen   . Schizophrenia (HCC)    History reviewed. No pertinent surgical history. Family History: History reviewed. No pertinent family history. Family Psychiatric  History: Noncontributory Tobacco Screening:   Social History:  Social History   Substance and Sexual Activity  Alcohol Use Yes   Comment: Refuses to disclose how much     Social History   Substance and Sexual Activity  Drug Use No   Comment: Refuses to answer    Additional Social History:                           Allergies:   Allergies  Allergen Reactions  . Aspirin Other (See Comments)    Possibly caused hives per sister  . Geodon [Ziprasidone Hcl] Other (See Comments)    Caused twitching   Lab Results: No results found for this or any previous visit (from the past 48 hour(s)).  Blood Alcohol level:  Lab Results  Component Value Date   ETH <10 02/29/2020   ETH <10 10/04/2019    Metabolic Disorder Labs:  Lab Results  Component Value Date   HGBA1C 5.7 (H) 03/14/2020   MPG 116.89 03/14/2020   MPG 128 04/16/2015   Lab Results  Component Value Date   PROLACTIN 13.6 04/16/2015   Lab Results  Component Value Date   CHOL 186 04/16/2015   TRIG 64 04/16/2015   HDL 55 04/16/2015   CHOLHDL 3.4 04/16/2015   VLDL 13 04/16/2015   LDLCALC 118 (H) 04/16/2015    Current  Medications: Current Facility-Administered Medications  Medication Dose Route Frequency Provider Last Rate Last Admin  . acetaminophen (TYLENOL) tablet 650 mg  650 mg Oral Q6H PRN Antonieta Pertlary, Greg Lawson, MD      . alum & mag hydroxide-simeth (MAALOX/MYLANTA) 200-200-20 MG/5ML suspension 30 mL  30 mL Oral Q4H PRN Antonieta Pertlary, Greg Lawson, MD      . benztropine (COGENTIN) tablet 0.5 mg  0.5 mg Oral BID PRN Antonieta Pertlary, Greg Lawson, MD      . dronabinol (MARINOL) capsule 2.5 mg  2.5 mg Oral BID AC Antonieta Pertlary, Greg Lawson, MD      . feeding supplement (BOOST / RESOURCE BREEZE) liquid 1 Container  1 Container Oral TID BM Antonieta Pertlary, Greg Lawson, MD      . geriatric multivitamins-minerals (ELDERTONIC/GEVRABON) liquid 15 mL  15 mL Oral Daily Antonieta Pertlary, Greg Lawson, MD      . haloperidol (HALDOL) 2 MG/ML solution  5 mg  5 mg Oral TID Antonieta Pert, MD      . hydrOXYzine (ATARAX/VISTARIL) tablet 25 mg  25 mg Oral TID PRN Antonieta Pert, MD      . Lumateperone Tosylate CAPS 42 mg  42 mg Oral Daily Antonieta Pert, MD      . magnesium hydroxide (MILK OF MAGNESIA) suspension 30 mL  30 mL Oral Daily PRN Antonieta Pert, MD      . megestrol (MEGACE) tablet 40 mg  40 mg Oral QID Antonieta Pert, MD   40 mg at 04/01/20 2038  . mirtazapine (REMERON) tablet 15 mg  15 mg Oral QHS Antonieta Pert, MD   15 mg at 04/01/20 2038  . traZODone (DESYREL) tablet 50 mg  50 mg Oral QHS PRN Antonieta Pert, MD       PTA Medications: Medications Prior to Admission  Medication Sig Dispense Refill Last Dose  . Acetaminophen (TYLENOL PO) Take 650 mg by mouth every 6 (six) hours as needed.     Marland Kitchen aluminum-magnesium hydroxide-simethicone (MAALOX) 200-200-20 MG/5ML SUSP Take 30 mLs by mouth every 4 (four) hours as needed.     . benztropine (COGENTIN) 0.5 MG tablet Take 1 tablet (0.5 mg total) by mouth 2 (two) times daily as needed for tremors.     Marland Kitchen dronabinol (MARINOL) 5 MG capsule Take 5 mg by mouth 2 (two) times daily before a meal.      . haloperidol (HALDOL) 10 MG tablet Take 1 tablet (10 mg total) by mouth 3 (three) times daily. (Patient taking differently: Take 5 mg by mouth every 6 (six) hours as needed. )     . hydrOXYzine (ATARAX/VISTARIL) 25 MG tablet Take 1 tablet (25 mg total) by mouth 3 (three) times daily as needed for anxiety. 30 tablet 0   . Lumateperone Tosylate 42 MG CAPS Take 42 mg by mouth daily.     . megestrol (MEGACE) 40 MG tablet Take 1 tablet (40 mg total) by mouth daily.     . mirtazapine (REMERON) 30 MG tablet Take 30 mg by mouth at bedtime.     . Multiple Vitamins-Minerals (MULTIVITAMIN WITH MINERALS) tablet Take 1 tablet by mouth daily.     . Nutritional Supplements (BOOST PO) Take 1 Container by mouth 2 (two) times daily between meals.     . paliperidone (INVEGA) 6 MG 24 hr tablet Take 1 tablet (6 mg total) by mouth at bedtime.     . traZODone (DESYREL) 50 MG tablet Take 1 tablet (50 mg total) by mouth at bedtime.       Musculoskeletal: Strength & Muscle Tone: within normal limits Gait & Station: normal Patient leans: [name]  Psychiatric Specialty Exam: Physical Exam Vitals and nursing note reviewed.  Constitutional:      General: She is not in acute distress.    Appearance: She is not ill-appearing, toxic-appearing or diaphoretic.  HENT:     Head: Normocephalic and atraumatic.  Pulmonary:     Effort: Pulmonary effort is normal.  Neurological:     General: No focal deficit present.     Mental Status: She is alert and oriented to person, place, and time.     Review of Systems  Constitutional: Negative for fatigue and fever.  Eyes: Negative for visual disturbance.  Respiratory: Negative for chest tightness and shortness of breath.   Cardiovascular: Negative for chest pain and palpitations.  Gastrointestinal: Negative for abdominal pain, constipation, diarrhea, nausea and vomiting.  Neurological: Negative for dizziness, light-headedness and headaches.  Psychiatric/Behavioral: Positive for  dysphoric mood. Negative for hallucinations, sleep disturbance and suicidal ideas. The patient is not nervous/anxious.     Blood pressure 98/62, pulse 85, temperature 99 F (37.2 C), temperature source Oral, resp. rate 18, height 5\' 4"  (1.626 m), weight 53.5 kg, SpO2 99 %.Body mass index is 20.25 kg/m.  General Appearance: Guarded  Eye Contact:  Poor  Speech:  Normal Rate  Volume:  Decreased  Mood:  Angry, Dysphoric and Irritable  Affect:  Flat  Thought Process:  Goal Directed and Descriptions of Associations: Loose  Orientation:  Full (Time, Place, and Person)  Thought Content:  Delusions, Paranoid Ideation and Rumination  Suicidal Thoughts:  No  Homicidal Thoughts:  No  Memory:  Immediate;   Fair Recent;   Fair Remote;   Fair  Judgement:  Impaired  Insight:  Lacking  Psychomotor Activity:  Decreased  Concentration:  Concentration: Fair and Attention Span: Fair  Recall:  of Knowledge:  Fair  Language:  Poor  Akathisia:  No  Handed:  Right  AIMS (if indicated):     Assets:  Resilience  ADL's:  Intact  Cognition:  WNL  Sleep:  Number of Hours: 6.5    Treatment Plan Summary: Pt is a 59 year old with a past history significant of Schizophrenia and refusal to eat secondary to paranoia admitted to [facility] from ER. Pt was transferred from [facility] to ER for IV fluids 03/25/20. Pt was originally admitted to [facility] from [place] [facility] ED on 03/02/2020 under IVC commitment for being violent and having auditory and visual hallucinations.  Labs- Iron- 60, UIBC- 215, TIBC- 275, Ferritin- 160, Folate- 5.6, Vitamin B12- 732, Creatinine -0.58, Magnesium- 2.3 WBC- 5.0, Hb- 10.4, RBC- 3.67, Platelets- 194000, Glucose- 91, Na0 139, K- 4.0, Ca- 8.5- Urinalysis- negative for wbc or bacteria Fecal occult blood- negative EKG- Sinus Rhythm, Prolonged Qtc- 577/514 LFT- Normal Blood pressure 98/62, pulse 85, temperature 99 F (37.2 C), temperature source Oral, resp. rate 18, height 5\' 4"  (1.626 m), weight  53.5 kg, SpO2 99 %. On Examination, She is still irritable and paranoid. Her speech is normal with normal rate. Her mood is dysphoric and irritable and affect is flat. Delusions, paranoid ideations  and delusions are present. NO SI, HI and AVH. Plan-  Daily contact with patient to assess and evaluate symptoms and progress in treatment -Monitor Vitals and wt -Monitor for withdrawal symptoms. -Monitor for medication side effects. -Continue Malox/Mylanta 30mg  Q4H PRN -Continue Cogentin 0.5mg   BID PRN -Continue Marinol 2.5 mg BID Before lunch and Supper -Continue Boost TID Between meals -Continue Eldertonic Liquid 15 ml oral daily -Continue Haldol 2mg  /ml Solution- 5 ml Oral TID -Continue Lumaterperone Tosylate caps 42 mg Daily -Continue Megace 40mg  Oral 4 times daily -Continue Remeron 15 mg QHS  -Continue Hydroxyzine 25 mg TID PRN for Anxiety. -Continue Trazodone 50 mg QHS PRN for sleep. -Encouragements to attend groups -Encouragement to eat and drink. -Disposition in progress.    Observation Level/Precautions:  15 minute checks  Laboratory:  None  Psychotherapy:    Medications:    Consultations:    Discharge Concerns:    Estimated LOS:  Other:     Physician Treatment Plan for Primary Diagnosis: <principal problem not specified> Long Term Goal(s): Improvement in symptoms so as ready for discharge  Short Term Goals: Ability to identify changes in lifestyle to reduce recurrence of condition will improve, Ability to verbalize feelings will improve,  Ability to disclose and discuss suicidal ideas, Ability to demonstrate self-control will improve, Ability to identify and develop effective coping behaviors will improve, Ability to maintain clinical measurements within normal limits will improve, Compliance with prescribed medications will improve and Ability to identify triggers associated with substance abuse/mental health issues will improve  Physician Treatment Plan for Secondary  Diagnosis: Active Problems:   Schizophrenia (HCC)  Long Term Goal(s): Improvement in symptoms so as ready for discharge  Short Term Goals: Ability to identify changes in lifestyle to reduce recurrence of condition will improve, Ability to verbalize feelings will improve, Ability to disclose and discuss suicidal ideas, Ability to demonstrate self-control will improve, Ability to identify and develop effective coping behaviors will improve, Ability to maintain clinical measurements within normal limits will improve, Compliance with prescribed medications will improve and Ability to identify triggers associated with substance abuse/mental health issues will improve  I certify that inpatient services furnished can reasonably be expected to improve the patient's condition.    Karsten Ro, MD 9/14/20218:09 AM

## 2020-04-02 NOTE — BHH Suicide Risk Assessment (Signed)
[facility] Admission Suicide Risk Assessment   Nursing information obtained from:  Patient Demographic factors:  Low socioeconomic status, Unemployed Current Mental Status:  NA Loss Factors:  Financial problems / change in socioeconomic status, Decline in physical health Historical Factors:  NA Risk Reduction Factors:  NA  Total Time spent with patient: 30 minutes Principal Problem: <principal problem not specified> Diagnosis:  Active Problems:   Schizophrenia (HCC)  Subjective Data: Patient is seen and examined.  Patient is a 59 year old female with a past psychiatric history significant for schizophrenia who was originally admitted to the behavioral health hospital from the [facility] emergency department on 03/02/2020.  She had originally presented there on 8/12 under involuntary commitment.  She was placed under involuntary commitment secondary to reportedly having auditory and visual hallucinations and had been violent.  The patient has a longstanding history of schizophrenia, and had actually been hospitalized at [facility] hospital in the past.  She had previously been prescribed haloperidol as well as the haloperidol decanoate long-acting injection.  She was brought to the hospital with the intention of starting her on the long-acting paliperidone injection.  She was given that.  Unfortunately during the early part of the hospitalization she remains significantly paranoid, and her paranoia led to such a degree that she refused to eat.  She was titrated on several antipsychotics which had no effect on her paranoia and as well continue to have problems with her paranoid induced anorexia.  Because of her lack of p.o. intake she was sent to the [facility] previously on 2 occasions.  She received IV fluids, and then was returned to our unit.  She also had some significant hypokalemia.  She was once again returned to the [facility] emergency  department secondary to  hypotension as well as poor p.o. intake on 03/25/2020.  She required medical hospitalization at that time.  She was hypotensive with systolic blood pressures in the 60s.  Her urinalysis showed ketones and showed no evidence of infection.  Her psychiatric medications were continued, and she was given IV fluids and she did take liquid supplemental protein during the course of her hospitalization there.  She was returned to our facility on 04/01/2020.  Her weight was up at 53 kg.  On examination today she remains paranoid, she is still paranoid about her food, but has taken the boost supplements.  She received a total of Haldol Decanoate 200 mg IM approximately on 03/17/2020.  She received the long-acting paliperidone injection on 03/02/2020.  These medications including Risperdal, Prolixin, oral Haldol and Caplyta have had essentially no effect on her paranoia towards food.  After she was stabilized medically at [facility] long hospital she was transferred back to our facility for continued care.  On examination this morning she appears to be essentially unchanged.  She is paranoid, irritable but nursing does state that she has been drinking boost.  Her vital signs are stable this morning.  Her blood pressure is 98/62, pulse was 85.  Her most recent weight this a.m. was 53.524 kg and her BMI was 20.24.  She slept 6.5 hours.  Continued Clinical Symptoms:  Alcohol Use Disorder Identification Test Final Score (AUDIT): 0 The "Alcohol Use Disorders Identification Test", Guidelines for Use in Primary Care, Second Edition.  World Science writer [facility]). Score between 0-7:  no or low risk or alcohol related problems. Score between 8-15:  moderate risk of alcohol related problems. Score between 16-19:  high risk of  alcohol related problems. Score 20 or above:  warrants further diagnostic evaluation for alcohol dependence and treatment.   CLINICAL FACTORS:   Schizophrenia:   Paranoid or  undifferentiated type   Musculoskeletal: Strength & Muscle Tone: within normal limits Gait & Station: normal Patient leans: [name]  Psychiatric Specialty Exam: Physical Exam Vitals and nursing note reviewed.  HENT:     Head: Normocephalic and atraumatic.  Pulmonary:     Effort: Pulmonary effort is normal.  Neurological:     General: No focal deficit present.     Mental Status: She is alert.     Review of Systems  Blood pressure 98/62, pulse 85, temperature 99 F (37.2 C), temperature source Oral, resp. rate 18, height 5\' 4"  (1.626 m), weight 53.5 kg, SpO2 99 %.Body mass index is 20.25 kg/m.  General Appearance: Disheveled  Eye Contact:  Minimal  Speech:  Normal Rate  Volume:  Decreased  Mood:  Dysphoric and Irritable  Affect:  Congruent  Thought Process:  Goal Directed and Descriptions of Associations: Loose  Orientation:  Full (Time, Place, and Person)  Thought Content:  Delusions and Paranoid Ideation  Suicidal Thoughts:  No  Homicidal Thoughts:  No  Memory:  Immediate;   Poor Recent;   Poor Remote;   Poor  Judgement:  Impaired  Insight:  Lacking  Psychomotor Activity:  Decreased  Concentration:  Concentration: Fair and Attention Span: Fair  Recall:  Poor  Fund of Knowledge:  Poor  Language:  Fair  Akathisia:  Negative  Handed:  Right  AIMS (if indicated):     Assets:  Desire for Improvement Resilience  ADL's:  Impaired  Cognition:  WNL  Sleep:  Number of Hours: 6.5      COGNITIVE FEATURES THAT CONTRIBUTE TO RISK:  Thought constriction (tunnel vision)    SUICIDE RISK:   Minimal: No identifiable suicidal ideation.  Patients presenting with no risk factors but with morbid ruminations; may be classified as minimal risk based on the severity of the depressive symptoms  PLAN OF CARE: Patient is seen and examined.  Patient is a 59 year old female with the above-stated past psychiatric history who was transferred back to our facility for continued psychiatric  care.  She will be admitted to the hospital.  She will be integrated in the milieu.  She will be encouraged to attend groups.  We will continue her Cogentin, Marinol, Haldol, hydroxyzine, Caplyta, Megace, mirtazapine and trazodone.  We will continue with daily weights and monitoring of her blood pressure.  We will also [facility] the intractable nature of her illness.  From early on in the hospitalization her primary caretaker (her sister) is undergoing cancer treatment right now, and is limited in the ability to confirm whether or not the patient is compliant with medications as well as eating and drinking adequately.  Her laboratories from 9/11 and 9/12 showed a creatinine of 0.58.  Her blood sugar was 116.  Liver function enzymes were normal.  Her prealbumin was 7.7.  Normal is 18-38.  Her folic acid is low at 5.6, she is mildly anemic with a hemoglobin of 10.4 and hematocrit of 32.4.  White blood cell count is normal at 5.  Her platelets are low normal at 194,000.  This is actually higher than it was 7 days ago.  An endocrine work-up for adrenal insufficiency while in the medical hospital was essentially negative.  Urinalysis was negative for white blood cells or bacteria.  Fecal occult blood was negative.  There is also some mention in the chart that the patient is a veteran and has been followed at least in the past and the [employer] system.  I will have social work contact them for collateral information as well.  We will attempt to contact her sister to see if her health issues are any more stable at this point.  Previously she believed that her family had been substituted by others (Capgras syndrome).  I certify that inpatient services furnished can reasonably be expected to improve the patient's condition.   Antonieta Pert, MD 04/02/2020, 9:21 AM

## 2020-04-02 NOTE — Progress Notes (Signed)
   04/02/20 2000  Psych Admission Type (Psych Patients Only)  Admission Status Involuntary  Psychosocial Assessment  Patient Complaints None  Eye Contact Fair  Facial Expression Sad  Affect Sad  Speech Soft;Slow  Interaction Minimal  Motor Activity Slow  Appearance/Hygiene Unremarkable  Behavior Characteristics Cooperative  Mood Depressed  Thought Process  Coherency Blocking  Content WDL  Delusions None reported or observed  Perception WDL  Hallucination None reported or observed  Judgment Impaired  Confusion None  Danger to Self  Current suicidal ideation? Denies  Danger to Others  Danger to Others None reported or observed   Pt isolates to her room. Pt took pills and is drinking nutritional supplements as ordered.

## 2020-04-02 NOTE — [facility] Counselor (Signed)
Adult Comprehensive Assessment  Patient ID: [name], female   DOB: 1960/09/25, 59 y.o.   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 of resources (include bankruptcy): Receives disability income Physical health (include injuries & life threatening diseases): Has not been eating or drinking  Living/Environment/Situation:  Living Arrangements: Other relatives Living conditions (as described by patient or guardian): Lives with mother  Who else lives in the home?: Mother How long has patient lived in current situation?: Almost all of life What is atmosphere in current home: Comfortable, Supportive  Family History:  Does patient have children?: No  Childhood History:  By whom was/is the patient raised?: Both parents Additional childhood history information: UTA Description of patient's relationship with caregiver when they were a child: UTA Patient's description of current relationship with people who raised him/her: Pt currently lives with her mother. Father is deceased. How were you disciplined when you got in trouble as a child/adolescent?: UTA Does patient have siblings?: Yes Number of Siblings: 5 Description of patient's current relationship with siblings: good, with exception of sister who is her guardian  Education:  Highest grade of school patient has completed: 12th Currently a student?: No Learning disability?: No  Employment/Work Situation:   Employment situation: On disability Why is patient on disability: UTA How long has patient been on disability: UTA Patient's job has been impacted by current illness: Yes Describe how patient's job has been impacted: UTA What is the  longest time patient has a held a job?: UTA 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 This patient had recently been admitted to the medical unit after being transferred due to malnutrition and falling down. This patient declined to answer questions that she considered "personal."  While here, [name] can benefit from crisis stabilization, medication management, therapeutic milieu, and referrals for services.

## 2020-04-03 NOTE — Progress Notes (Signed)
   04/03/20 1034  Psych Admission Type (Psych Patients Only)  Admission Status Involuntary  Psychosocial Assessment  Patient Complaints None  Eye Contact Fair  Facial Expression Sad  Affect Sad  Speech Soft;Slow  Interaction Minimal  Motor Activity Slow  Appearance/Hygiene Unremarkable  Behavior Characteristics Cooperative  Mood Depressed  Thought Process  Coherency Blocking  Content WDL  Delusions None reported or observed  Perception WDL  Hallucination None reported or observed  Judgment Impaired  Confusion None  Danger to Self  Current suicidal ideation? Denies  Danger to Others  Danger to Others None reported or observed

## 2020-04-03 NOTE — Progress Notes (Signed)
[facility] MD Progress Note  04/03/2020 8:50 PM [name]  MRN:  [id number] Subjective: Pt is seen and examined today. Pt states her mood is ok . Pt slept well last night. Nursing notes indicate that Pt slept for    10.25 hours. Pt states she won't eat anything because the food served at the hospital has no nutritional value. Pt states she ate at [facility] as their food has more nutritional value. Pt has been drinking water and nutritional drink. Pt states she wants to go home. Upon asking about her family, she gets irritable that her family is not real and she only has her grandmother. Pt states "What good it is going to do by asking questions about my family","I just want to go home". Pt has been drinking boost nutrition supplement, water and taking her medications. Pt Currently, Pt denies any suicidal ideation, homicidal ideation and, visual and auditory hallucination. Pt denies any headache, nausea, vomiting, dizziness, chest pain, SOB, abdominal pain, diarrhea, and constipation. Pt denies any medication side effects and has been tolerating it well. Pt denies any concerns. Pt is encouraged to eat and drink and to attend groups therapies. On Examination, She is still irritable and paranoid. Her speech is normal with normal rate. Her mood is dysphoric and irritable and affect is flat. Delusions, paranoid ideations  and delusions are present. Her judgement is impaired and insight is lacking. NO SI, HI and AVH. Nursing notes indicate that Pt took all prescribed medication.  Food and fluid intake poor.  Pt isolates to her room. Pt took pills and is drinking nutritional supplements as ordered. She did not eat breakfast  With CSW, Pt indicated goal as "To go home". CSW spoke with this patients sister who stated that at this time she would be unable to monitor or support her sister due to her own physical health issues. She stated that this patient does have an ACT Team who can assist with monitoring her,  however this patient will not allow them into the house and wears clothes that are hard to monitor weight loss/gain. Objective : Pt is a 59 year old with a past history significant of Schizophrenia and refusal to eat secondary to paranoia admitted to [facility] from ER. [facility] for [facility] fluids 03/25/20. Pt was originally admitted to [facility] from [place] [facility] ED on 03/02/2020 under IVC commitment for being violent and having auditory and visual hallucinations.  Principal Problem: <principal problem not specified> Diagnosis: Active Problems:   Schizophrenia (HCC)  Total Time spent with patient: 20 minutes  Past Psychiatric History: see H&P  Past Medical History:  Past Medical History:  Diagnosis Date  . Non compliance w medication regimen   . Schizophrenia (HCC)    History reviewed. No pertinent surgical history. Family History: History reviewed. No pertinent family history. Family Psychiatric  History: See H&P Social History:  Social History   Substance and Sexual Activity  Alcohol Use Yes   Comment: Refuses to disclose how much     Social History   Substance and Sexual Activity  Drug Use No   Comment: Refuses to answer    Social History   Socioeconomic History  . Marital status: Single    Spouse name: Not on file  . Number of children: Not on file  . Years of education: Not on file  . Highest education level: Not on file  Occupational History  . Not on file  Tobacco Use  . Smoking  status: Current Some Day Smoker    Types: Cigarettes  . Smokeless tobacco: Never Used  Vaping Use  . Vaping Use: Every day  Substance and Sexual Activity  . Alcohol use: Yes    Comment: Refuses to disclose how much  . Drug use: No    Comment: Refuses to answer  . Sexual activity: Not on file    Comment: refused to answer  Other Topics Concern  . Not on file  Social History Narrative  . Not on file   Social Determinants of Health   Financial Resource Strain:   .  Difficulty of Paying Living Expenses: Not on file  Food Insecurity:   . Worried About Programme researcher, broadcasting/film/video in the Last Year: Not on file  . Ran Out of Food in the Last Year: Not on file  Transportation Needs:   . Lack of Transportation (Medical): Not on file  . Lack of Transportation (Non-Medical): Not on file  Physical Activity:   . Days of Exercise per Week: Not on file  . Minutes of Exercise per Session: Not on file  Stress:   . Feeling of Stress : Not on file  Social Connections:   . Frequency of Communication with Friends and Family: Not on file  . Frequency of Social Gatherings with Friends and Family: Not on file  . Attends Religious Services: Not on file  . Active Member of Clubs or Organizations: Not on file  . Attends Banker Meetings: Not on file  . Marital Status: Not on file   Additional Social History:                         Sleep: Good  Appetite:  Poor  Current Medications: Current Facility-Administered Medications  Medication Dose Route Frequency Provider Last Rate Last Admin  . acetaminophen (TYLENOL) tablet 650 mg  650 mg Oral Q6H PRN Antonieta Pert, MD      . alum & mag hydroxide-simeth (MAALOX/MYLANTA) 200-200-20 MG/5ML suspension 30 mL  30 mL Oral Q4H PRN Antonieta Pert, MD      . benztropine (COGENTIN) tablet 0.5 mg  0.5 mg Oral BID PRN Antonieta Pert, MD      . dronabinol (MARINOL) capsule 2.5 mg  2.5 mg Oral BID AC Antonieta Pert, MD   2.5 mg at 04/03/20 1655  . feeding supplement (BOOST / RESOURCE BREEZE) liquid 1 Container  1 Container Oral TID BM Antonieta Pert, MD   1 Container at 04/03/20 1311  . geriatric multivitamins-minerals (ELDERTONIC/GEVRABON) liquid 15 mL  15 mL Oral Daily Antonieta Pert, MD   15 mL at 04/03/20 0813  . haloperidol (HALDOL) 2 MG/ML solution 5 mg  5 mg Oral TID Antonieta Pert, MD   5 mg at 04/03/20 1655  . hydrOXYzine (ATARAX/VISTARIL) tablet 25 mg  25 mg Oral TID PRN Antonieta Pert, MD      . Lumateperone Tosylate CAPS 42 mg  42 mg Oral Daily Antonieta Pert, MD   42 mg at 04/03/20 0813  . magnesium hydroxide (MILK OF MAGNESIA) suspension 30 mL  30 mL Oral Daily PRN Antonieta Pert, MD      . megestrol (MEGACE) tablet 40 mg  40 mg Oral QID Antonieta Pert, MD   40 mg at 04/03/20 1655  . mirtazapine (REMERON) tablet 15 mg  15 mg Oral QHS Antonieta Pert, MD   15 mg at 04/02/20  2041  . traZODone (DESYREL) tablet 50 mg  50 mg Oral QHS PRN Antonieta Pertlary, Greg Lawson, MD        Lab Results: No results found for this or any previous visit (from the past 48 hour(s)).  Blood Alcohol level:  Lab Results  Component Value Date   ETH <10 02/29/2020   ETH <10 10/04/2019    Metabolic Disorder Labs: Lab Results  Component Value Date   HGBA1C 5.7 (H) 03/14/2020   MPG 116.89 03/14/2020   MPG 128 04/16/2015   Lab Results  Component Value Date   PROLACTIN 13.6 04/16/2015   Lab Results  Component Value Date   CHOL 186 04/16/2015   TRIG 64 04/16/2015   HDL 55 04/16/2015   CHOLHDL 3.4 04/16/2015   VLDL 13 04/16/2015   LDLCALC 118 (H) 04/16/2015    Physical Findings: AIMS:  , ,  ,  ,    CIWA:    COWS:     Musculoskeletal: Strength & Muscle Tone: within normal limits Gait & Station: normal Patient leans: [name]  Psychiatric Specialty Exam: Physical Exam Vitals and nursing note reviewed.  Constitutional:      General: She is not in acute distress.    Appearance: Normal appearance. She is not ill-appearing, toxic-appearing or diaphoretic.  HENT:     Head: Normocephalic and atraumatic.  Pulmonary:     Effort: Pulmonary effort is normal.  Neurological:     General: No focal deficit present.     Mental Status: She is alert and oriented to person, place, and time.     Review of Systems  Constitutional: Negative for activity change and appetite change.  Respiratory: Negative for chest tightness and shortness of breath.   Cardiovascular: Negative  for chest pain and palpitations.  Gastrointestinal: Negative for abdominal pain, diarrhea, nausea and vomiting.  Neurological: Negative for dizziness, numbness and headaches.  Psychiatric/Behavioral: Negative for dysphoric mood, hallucinations and suicidal ideas. The patient is not nervous/anxious.     Blood pressure 100/71, pulse 63, temperature 98.3 F (36.8 C), temperature source Oral, resp. rate 16, height 5\' 4"  (1.626 m), weight 53.1 kg, SpO2 99 %.Body mass index is 20.08 kg/m.  General Appearance: Disheveled  Eye Contact:  Minimal  Speech:  Normal Rate  Volume:  Decreased  Mood:  Dysphoric and Irritable  Affect:  Flat  Thought Process:  Goal Directed and Descriptions of Associations: Loose  Orientation:  Full (Time, Place, and Person)  Thought Content:  Delusions and Paranoid Ideation  Suicidal Thoughts:  No  Homicidal Thoughts:  No  Memory:  Immediate;   Poor Recent;   Poor Remote;   Poor  Judgement:  Impaired  Insight:  Lacking  Psychomotor Activity:  Decreased  Concentration:  Concentration: Fair and Attention Span: Fair  Recall:  [employer]  Fund of Knowledge:  Poor  Language:  Good  Akathisia:  Negative  Handed:  Right  AIMS (if indicated):     Assets:  Desire for Improvement Resilience Social Support  ADL's:  Intact  Cognition:  WNL  Sleep:  Number of Hours: 10.25     Treatment Plan Summary: Pt is a 59 year old with a past history significant of Schizophrenia and refusal to eat secondary to [facility].  On Examination, She is still irritable and paranoid. Her speech is normal with normal rate. Her mood is dysphoric and irritable and affect is flat. Delusions, paranoid ideations  and delusions are present. Her judgement is impaired and insight is lacking. NO SI, HI and AVH. Blood pressure 100/71, pulse 63, temperature 98.3 F (36.8 C), temperature source Oral, resp. rate 16, height 5\' 4"   (1.626 m), weight 53.1 kg, SpO2 99 %. Plan- Daily contact with patient to assess and evaluate symptoms and progress in treatment - Repeat BMP tomorrow Morning. -Daily wt Checks. - Monitor Vitals -Monitor for withdrawal symptoms. -Monitor for medication side effects. -Continue Malox/Mylanta 30mg  Q4H PRN -Continue Cogentin 0.5mg   BID PRN -Continue Marinol 2.5 mg BID Before lunch and Supper -Continue Boost TID Between meals -Continue Eldertonic Liquid 15 ml oral daily -Continue Haldol 2mg  /ml Solution- 5 ml Oral TID -Continue Lumaterperone Tosylate caps 42 mg Daily -Continue Megace 40mg  Oral 4 times daily -Continue Remeron 15 mg QHS  -Continue Hydroxyzine 25 mg TID PRN for Anxiety. -Continue Trazodone 50 mg QHS PRN for sleep. -Encouragements to attend groups -Encouragement to eat and drink. -Disposition in progress. Pt's sister cannot take care of Pt because of her own medical issues. CSW will try to contact [facility] If Pt can be transferred there.   , MD 04/03/2020, 8:50 PM

## 2020-04-03 NOTE — [facility] Counselor (Signed)
CSW spoke with this patients sister who stated that at this time she would be unable to monitor or support her sister due to her own physical health issues. She stated that this patient does have an ACT Team who can assist with monitoring her, however this patient will not allow them into the house and wears clothes that are hard to monitor weight loss/gain.    Ruthann Cancer MSW, LCSW Clincal Social Worker  [facility]

## 2020-04-03 NOTE — Progress Notes (Signed)
Patient ID: Safaa Stingley, female   DOB: [birth year], 59 y.o.   MRN: [id number] D: Patient with blunted affect, mood depressed and congruent, pt denies SI/HI/AVH, reports that she did not eat breakfast because she did not want it. Pt reports a good night's sleep,  Given Boost Nutrition supplement and drank it.  A: Patient given all meds as ordered, Q15 minute checks being maintained for safety.  R: Will continue to monitor on Q15 minute safety checks [place] NOVEL CORONAVIRUS (COVID-19) DAILY CHECK-OFF SYMPTOMS - answer yes or no to each - every day NO YES  Have you had a fever in the past 24 hours?   Fever (Temp > 37.80C / 100F) X   Have you had any of these symptoms in the past 24 hours?  New Cough   Sore Throat    Shortness of Breath   Difficulty Breathing   Unexplained Body Aches   X   Have you had any one of these symptoms in the past 24 hours not related to allergies?    Runny Nose   Nasal Congestion   Sneezing   X   If you have had runny nose, nasal congestion, sneezing in the past 24 hours, has it worsened?  X   EXPOSURES - check yes or no X   Have you traveled outside the state in the past 14 days?  X   Have you been in contact with someone with a confirmed diagnosis of COVID-19 or PUI in the past 14 days without wearing appropriate PPE?  X   Have you been living in the same home as a person with confirmed diagnosis of COVID-19 or a PUI (household contact)?    X   Have you been diagnosed with COVID-19?    X              What to do next: Answered NO to all: Answered YES to anything:   Proceed with unit schedule Follow the [facility] Inpatient Flowsheet.

## 2020-04-03 NOTE — Progress Notes (Signed)
   04/03/20 2214  Psych Admission Type (Psych Patients Only)  Admission Status Involuntary  Psychosocial Assessment  Patient Complaints None  Eye Contact Fair  Facial Expression Sad  Affect Sad  Speech Soft;Slow  Interaction Minimal  Motor Activity Slow  Appearance/Hygiene Unremarkable  Behavior Characteristics Cooperative  Mood Depressed  Thought Process  Coherency Blocking  Content WDL  Delusions None reported or observed  Perception WDL  Hallucination None reported or observed  Judgment Impaired  Confusion None  Danger to Self  Current suicidal ideation? Denies  Danger to Others  Danger to Others None reported or observed   Pt still agreeing to take PO meds. Pt refusing to eat but drinking Boost nutritional supplements.

## 2020-04-03 NOTE — Tx Team (Signed)
Interdisciplinary Treatment and Diagnostic Plan Update  04/03/2020 Time of Session: 9:50am [name] MRN: [id number]  Principal Diagnosis: <principal problem not specified>  Secondary Diagnoses: Active Problems:   Schizophrenia (HCC)   Current Medications:  Current Facility-Administered Medications  Medication Dose Route Frequency Provider Last Rate Last Admin  . acetaminophen (TYLENOL) tablet 650 mg  650 mg Oral Q6H PRN Antonieta Pert, MD      . alum & mag hydroxide-simeth (MAALOX/MYLANTA) 200-200-20 MG/5ML suspension 30 mL  30 mL Oral Q4H PRN Antonieta Pert, MD      . benztropine (COGENTIN) tablet 0.5 mg  0.5 mg Oral BID PRN Antonieta Pert, MD      . dronabinol (MARINOL) capsule 2.5 mg  2.5 mg Oral BID AC Antonieta Pert, MD   2.5 mg at 04/02/20 1648  . feeding supplement (BOOST / RESOURCE BREEZE) liquid 1 Container  1 Container Oral TID BM Antonieta Pert, MD   1 Container at 04/03/20 [phone]  . geriatric multivitamins-minerals (ELDERTONIC/GEVRABON) liquid 15 mL  15 mL Oral Daily Antonieta Pert, MD   15 mL at 04/03/20 0813  . haloperidol (HALDOL) 2 MG/ML solution 5 mg  5 mg Oral TID Antonieta Pert, MD   5 mg at 04/03/20 0813  . hydrOXYzine (ATARAX/VISTARIL) tablet 25 mg  25 mg Oral TID PRN Antonieta Pert, MD      . Lumateperone Tosylate CAPS 42 mg  42 mg Oral Daily Antonieta Pert, MD   42 mg at 04/03/20 0813  . magnesium hydroxide (MILK OF MAGNESIA) suspension 30 mL  30 mL Oral Daily PRN Antonieta Pert, MD      . megestrol (MEGACE) tablet 40 mg  40 mg Oral QID Antonieta Pert, MD   40 mg at 04/03/20 0813  . mirtazapine (REMERON) tablet 15 mg  15 mg Oral QHS Antonieta Pert, MD   15 mg at 04/02/20 2041  . traZODone (DESYREL) tablet 50 mg  50 mg Oral QHS PRN Antonieta Pert, MD       PTA Medications: Medications Prior to Admission  Medication Sig Dispense Refill Last Dose  . Acetaminophen (TYLENOL PO) Take 650 mg by mouth every 6 (six)  hours as needed.     Marland Kitchen aluminum-magnesium hydroxide-simethicone (MAALOX) 200-200-20 MG/5ML SUSP Take 30 mLs by mouth every 4 (four) hours as needed.     . benztropine (COGENTIN) 0.5 MG tablet Take 1 tablet (0.5 mg total) by mouth 2 (two) times daily as needed for tremors.     Marland Kitchen dronabinol (MARINOL) 5 MG capsule Take 5 mg by mouth 2 (two) times daily before a meal.     . haloperidol (HALDOL) 10 MG tablet Take 1 tablet (10 mg total) by mouth 3 (three) times daily. (Patient taking differently: Take 5 mg by mouth every 6 (six) hours as needed. )     . hydrOXYzine (ATARAX/VISTARIL) 25 MG tablet Take 1 tablet (25 mg total) by mouth 3 (three) times daily as needed for anxiety. 30 tablet 0   . Lumateperone Tosylate 42 MG CAPS Take 42 mg by mouth daily.     . megestrol (MEGACE) 40 MG tablet Take 1 tablet (40 mg total) by mouth daily.     . mirtazapine (REMERON) 30 MG tablet Take 30 mg by mouth at bedtime.     . Multiple Vitamins-Minerals (MULTIVITAMIN WITH MINERALS) tablet Take 1 tablet by mouth daily.     . Nutritional Supplements (BOOST PO) Take 1  Container by mouth 2 (two) times daily between meals.     . paliperidone (INVEGA) 6 MG 24 hr tablet Take 1 tablet (6 mg total) by mouth at bedtime.     . traZODone (DESYREL) 50 MG tablet Take 1 tablet (50 mg total) by mouth at bedtime.       Patient Stressors: Health problems Marital or family conflict Medication change or noncompliance  Patient Strengths: Average or above average intelligence Supportive family/friends  Treatment Modalities: Medication Management, Group therapy, Case management,  1 to 1 session with clinician, Psychoeducation, Recreational therapy.   Physician Treatment Plan for Primary Diagnosis: <principal problem not specified> Long Term Goal(s): Improvement in symptoms so as ready for discharge Improvement in symptoms so as ready for discharge   Short Term Goals: Ability to identify changes in lifestyle to reduce recurrence of  condition will improve Ability to verbalize feelings will improve Ability to disclose and discuss suicidal ideas Ability to demonstrate self-control will improve Ability to identify and develop effective coping behaviors will improve Ability to maintain clinical measurements within normal limits will improve Compliance with prescribed medications will improve Ability to identify triggers associated with substance abuse/mental health issues will improve Ability to identify changes in lifestyle to reduce recurrence of condition will improve Ability to verbalize feelings will improve Ability to disclose and discuss suicidal ideas Ability to demonstrate self-control will improve Ability to identify and develop effective coping behaviors will improve Ability to maintain clinical measurements within normal limits will improve Compliance with prescribed medications will improve Ability to identify triggers associated with substance abuse/mental health issues will improve  Medication Management: Evaluate patient's response, side effects, and tolerance of medication regimen.  Therapeutic Interventions: 1 to 1 sessions, Unit Group sessions and Medication administration.  Evaluation of Outcomes: Progressing  Physician Treatment Plan for Secondary Diagnosis: Active Problems:   Schizophrenia (HCC)  Long Term Goal(s): Improvement in symptoms so as ready for discharge Improvement in symptoms so as ready for discharge   Short Term Goals: Ability to identify changes in lifestyle to reduce recurrence of condition will improve Ability to verbalize feelings will improve Ability to disclose and discuss suicidal ideas Ability to demonstrate self-control will improve Ability to identify and develop effective coping behaviors will improve Ability to maintain clinical measurements within normal limits will improve Compliance with prescribed medications will improve Ability to identify triggers associated  with substance abuse/mental health issues will improve Ability to identify changes in lifestyle to reduce recurrence of condition will improve Ability to verbalize feelings will improve Ability to disclose and discuss suicidal ideas Ability to demonstrate self-control will improve Ability to identify and develop effective coping behaviors will improve Ability to maintain clinical measurements within normal limits will improve Compliance with prescribed medications will improve Ability to identify triggers associated with substance abuse/mental health issues will improve     Medication Management: Evaluate patient's response, side effects, and tolerance of medication regimen.  Therapeutic Interventions: 1 to 1 sessions, Unit Group sessions and Medication administration.  Evaluation of Outcomes: Progressing   RN Treatment Plan for Primary Diagnosis: <principal problem not specified> Long Term Goal(s): Knowledge of disease and therapeutic regimen to maintain health will improve  Short Term Goals: Ability to remain free from injury will improve, Ability to verbalize frustration and anger appropriately will improve, Ability to identify and develop effective coping behaviors will improve and Compliance with prescribed medications will improve  Medication Management: RN will administer medications as ordered by provider, will assess and evaluate patient's response  and provide education to patient for prescribed medication. RN will report any adverse and/or side effects to prescribing provider.  Therapeutic Interventions: 1 on 1 counseling sessions, Psychoeducation, Medication administration, Evaluate responses to treatment, Monitor vital signs and CBGs as ordered, Perform/monitor CIWA, COWS, AIMS and Fall Risk screenings as ordered, Perform wound care treatments as ordered.  Evaluation of Outcomes: Progressing   LCSW Treatment Plan for Primary Diagnosis: <principal problem not specified> Long  Term Goal(s): Safe transition to appropriate next level of care at discharge, Engage patient in therapeutic group addressing interpersonal concerns.  Short Term Goals: Engage patient in aftercare planning with referrals and resources, Increase social support, Increase ability to appropriately verbalize feelings and Increase skills for wellness and recovery  Therapeutic Interventions: Assess for all discharge needs, 1 to 1 time with Social worker, Explore available resources and support systems, Assess for adequacy in community support network, Educate family and significant other(s) on suicide prevention, Complete Psychosocial Assessment, Interpersonal group therapy.  Evaluation of Outcomes: Progressing   Progress in Treatment: Attending groups: Yes. Participating in groups: Yes. Taking medication as prescribed: Yes. Toleration medication: Yes. Family/Significant other contact made: Yes, individual(s) contacted:  sister Patient understands diagnosis: No. Discussing patient identified problems/goals with staff: Yes. Medical problems stabilized or resolved: No. Denies suicidal/homicidal ideation: Yes. Issues/concerns per patient self-inventory: No.   New problem(s) identified: Yes, Describe:  Patient has not been consuming food or drink due to paranoia  New Short Term/Long Term Goal(s): medication stabilization, elimination of SI thoughts, development of comprehensive mental wellness plan.   Patient Goals:  "To go home"  Discharge Plan or Barriers: This patient is to return home to live with mother and to continue care with established ACT Team.  Reason for Continuation of Hospitalization: Delusions  Medication stabilization  Estimated Length of Stay: 1-3 days   Attendees: Patient: [name] 04/03/2020   Physician: Landry Mellow, MD 04/03/2020   Nursing:  04/03/2020  RN Care Manager: 04/03/2020  Social Worker: Ruthann Cancer, LCSW 04/03/2020   Recreational Therapist:  04/03/2020    Other:  04/03/2020  Other:  04/03/2020   Other: 04/03/2020      Scribe for Treatment Team: Otelia Santee, LCSW 04/03/2020 10:35 AM

## 2020-04-04 LAB — BASIC METABOLIC PANEL
Anion gap: 11 (ref 5–15)
BUN: 10 mg/dL (ref 6–20)
CO2: 27 mmol/L (ref 22–32)
Calcium: 8.9 mg/dL (ref 8.9–10.3)
Chloride: 103 mmol/L (ref 98–111)
Creatinine, Ser: 0.75 mg/dL (ref 0.44–1.00)
GFR calc Af Amer: >60 mL/min (ref >60)
GFR calc non Af Amer: >60 mL/min (ref >60)
Glucose, Bld: 94 mg/dL (ref 70–99)
Potassium: 3.7 mmol/L (ref 3.5–5.1)
Sodium: 141 mmol/L (ref 135–145)

## 2020-04-04 MED ORDER — MEGESTROL ACETATE 40 MG PO TABS
40.0000 mg | ORAL_TABLET | Freq: Four times a day (QID) | ORAL | 0 refills | Status: AC
Start: 2020-04-04 — End: 2020-05-04

## 2020-04-04 MED ORDER — HALOPERIDOL LACTATE 2 MG/ML PO CONC
5.0000 mg | Freq: Three times a day (TID) | ORAL | 0 refills | Status: DC
Start: 2020-04-04 — End: 2020-10-02

## 2020-04-04 MED ORDER — DRONABINOL 2.5 MG PO CAPS
2.5000 mg | ORAL_CAPSULE | Freq: Two times a day (BID) | ORAL | 0 refills | Status: DC
Start: 2020-04-04 — End: 2020-04-04

## 2020-04-04 MED ORDER — ELDERTONIC PO LIQD: 15.0000 mL | Freq: Every day | ORAL | 0 refills | Status: AC

## 2020-04-04 MED ORDER — CAPLYTA 42 MG PO CAPS
42.0000 mg | ORAL_CAPSULE | Freq: Every day | ORAL | 0 refills | Status: DC
Start: 2020-04-05 — End: 2020-04-04

## 2020-04-04 MED ORDER — MIRTAZAPINE 15 MG PO TABS
15.0000 mg | ORAL_TABLET | Freq: Every day | ORAL | 0 refills | Status: DC
Start: 2020-04-04 — End: 2020-10-02

## 2020-04-04 NOTE — Progress Notes (Signed)
  [facility] Adult Case Management Discharge Plan :  Will you be returning to the same living situation after discharge:  Yes,  to home with mother At discharge, do you have transportation home?: Yes,  mother to pick this patient up Do you have the ability to pay for your medications: Yes,  has insurance   Release of information consent forms completed and in the chart;  Patient's signature needed at discharge.  Patient to Follow up at:  Follow-up Information    [facility] Follow up.   Contact information: [address] [place] [address] [phone]               Next level of care provider has access to [facility] Link:no  Safety Planning and Suicide Prevention discussed: Yes,  with sister     Has patient been referred to the Quitline?: Patient refused referral  Patient has been referred for addiction treatment: [name]  Otelia Santee, LCSW 04/04/2020, 9:42 AM

## 2020-04-04 NOTE — [facility] Suicide Risk Assessment (Signed)
[facility] Discharge Suicide Risk Assessment   Principal Problem: Schizophrenia, paranoid (HCC) Discharge Diagnoses: Principal Problem:   Schizophrenia, paranoid (HCC) Active Problems:   Hypokalemic alkalosis   Medically noncompliant   Noncompliance with diet and medication regimen   Schizophrenia (HCC)   Paranoid schizophrenia (HCC)   Anorexia   Malnutrition of moderate degree   Total Time spent with patient: 15 minutes  Musculoskeletal: Strength & Muscle Tone: within normal limits Gait & Station: normal Patient leans: [name]  Psychiatric Specialty Exam: Review of Systems  Constitutional: Positive for appetite change and unexpected weight change.  Psychiatric/Behavioral: Positive for behavioral problems.  All other systems reviewed and are negative.   Blood pressure (!) 82/60, pulse 89, temperature 98.4 F (36.9 C), temperature source Oral, resp. rate 16, height 5\' 4"  (1.626 m), weight 52.2 kg, SpO2 99 %.Body mass index is 19.74 kg/m.  General Appearance: Casual  Eye Contact::  Fair  Speech:  Normal Rate409  Volume:  Decreased  Mood:  Anxious  Affect:  Flat  Thought Process:  Goal Directed and Descriptions of Associations: Loose  Orientation:  Full (Time, Place, and Person)  Thought Content:  Delusions and Paranoid Ideation  Suicidal Thoughts:  No  Homicidal Thoughts:  No  Memory:  Immediate;   Fair Recent;   Fair Remote;   Fair  Judgement:  Intact  Insight:  Lacking  Psychomotor Activity:  Decreased  Concentration:  Fair  Recall:  [phone] of Knowledge:Fair  Language: Good  Akathisia:  Negative  Handed:  Right  AIMS (if indicated):     Assets:  Desire for Improvement Housing Resilience  Sleep:  Number of Hours: 6.75  Cognition: WNL  ADL's:  Intact   Mental Status Per Nursing Assessment::   On Admission:  NA  Demographic Factors:  Low socioeconomic status and Unemployed  Loss Factors: Decline in physical health  Historical Factors: Impulsivity  Risk  Reduction Factors:   Living with another person, especially a relative and Positive therapeutic relationship  Continued Clinical Symptoms:  Schizophrenia:   Paranoid or undifferentiated type  Cognitive Features That Contribute To Risk:  Closed-mindedness    Suicide Risk:  Minimal: No identifiable suicidal ideation.  Patients presenting with no risk factors but with morbid ruminations; may be classified as minimal risk based on the severity of the depressive symptoms   Follow-up Information    [facility] Follow up.   Contact information: [address] [place] [phone]               Plan Of Care/Follow-up recommendations:  Activity:  ad lib  [phone], MD 04/04/2020, 9:58 AM

## 2020-04-04 NOTE — Progress Notes (Signed)
Pt discharged to lobby. Pt was stable and appreciative at that time. All papers and prescriptions were given and valuables returned. Verbal understanding expressed. Denies SI/HI and A/VH. Pt given opportunity to express concerns and ask questions.  

## 2020-04-04 NOTE — Progress Notes (Signed)
Per [employer] RN Kennyth Arnold), patient's belongings are located at [facility].  Called both numbers listed on patient's demographics but was unsuccessful with contacting patient.  Let message with patient's sister to have patient or patient's mother Higher education careers adviser at [phone].

## 2020-04-04 NOTE — Progress Notes (Signed)
Recreation Therapy Notes  Date: 9.16.21 Time: 0950 Location: [address] Dayroom  Group Topic: Coping Skills  Goal Area(s) Addresses:  Patient will identify positive coping skills. Patient will identify benefit of using coping skills post d/c.  Intervention: Worksheet, Music  Activity: Coping Skills A to Z.  Patients listened to music in the background as they attempted to identify coping skills for each letter of the alphabet.  Education: Coping Skills, Discharge Planning.   Education Outcome: Acknowledges understanding/In group clarification offered/Needs additional education.   Clinical Observations/Feedback: Pt did not attend group.    [name], LRT/CTRS         Shakiya Mcneary A 04/04/2020 10:53 AM 

## 2020-04-04 NOTE — Discharge Summary (Signed)
Physician Discharge Summary Note  Patient:  [name] is an 59 y.o., female MRN:  [id number] DOB:  1960-10-20 Patient phone:  [phone] (home)  Patient address:   [address] [place] [address],  Total Time spent with patient: 30 minutes  Date of Admission:  04/01/2020 Date of Discharge: 04/04/2020  Reason for Admission:  Paranoid Schizophrenia  Principal Problem: Schizophrenia, paranoid (HCC) Discharge Diagnoses: Principal Problem:   Schizophrenia, paranoid (HCC) Active Problems:   Hypokalemic alkalosis   Medically noncompliant   Noncompliance with diet and medication regimen   Schizophrenia (HCC)   Paranoid schizophrenia (HCC)   Anorexia   Malnutrition of moderate degree   Past Psychiatric History: Patient has had multiple psychiatric hospitalizations in the past.  [facility] secondary to similar circumstances.  She had been hospitalized apparently at an outside facility [facility]) for 4 weeks prior to being transferred to [facility].  Past Medical History:  Past Medical History:  Diagnosis Date  . Non compliance w medication regimen   . Schizophrenia (HCC)    History reviewed. No pertinent surgical history. Family History: History reviewed. No pertinent family history. Family Psychiatric  History: Noncontributory Social History:  Social History   Substance and Sexual Activity  Alcohol Use Yes   Comment: Refuses to disclose how much     Social History   Substance and Sexual Activity  Drug Use No   Comment: Refuses to answer    Social History   Socioeconomic History  . Marital status: Single    Spouse name: Not on file  . Number of children: Not on file  . Years of education: Not on file  . Highest education level: Not on file  Occupational History  . Not on file  Tobacco Use  . Smoking status: Current Some Day Smoker    Types: Cigarettes  . Smokeless tobacco: Never Used  Vaping  Use  . Vaping Use: Every day  Substance and Sexual Activity  . Alcohol use: Yes    Comment: Refuses to disclose how much  . Drug use: No    Comment: Refuses to answer  . Sexual activity: Not on file    Comment: refused to answer  Other Topics Concern  . Not on file  Social History Narrative  . Not on file   Social Determinants of Health   Financial Resource Strain:   . Difficulty of Paying Living Expenses: Not on file  Food Insecurity:   . Worried About Programme researcher, broadcasting/film/video in the Last Year: Not on file  . Ran Out of Food in the Last Year: Not on file  Transportation Needs:   . Lack of Transportation (Medical): Not on file  . Lack of Transportation (Non-Medical): Not on file  Physical Activity:   . Days of Exercise per Week: Not on file  . Minutes of Exercise per Session: Not on file  Stress:   . Feeling of Stress : Not on file  Social Connections:   . Frequency of Communication with Friends and Family: Not on file  . Frequency of Social Gatherings with Friends and Family: Not on file  . Attends Religious Services: Not on file  . Active Member of Clubs or Organizations: Not on file  . Attends Banker Meetings: Not on file  . Marital Status: Not on file    Hospital Course:  Admission Notes (H&P) (03/02/2020)-Patient is a 59 year old female with a past psychiatric history significant for schizophrenia  who presented to the [facility] emergency department on 02/29/2020 brought by the sheriff's office under involuntary commitment. She was placed under involuntary commitment by the report that she was experiencing auditory as well as visual hallucinations and was violent. Unfortunately patient was a poor historian but the early information was that she had been off her psychiatric medications for approximately 2 years. Review of the electronic medical record revealed that she had previously been prescribed oral Haldol, the long-acting Haldol Decanoate  injection which she reportedly received on 08/30/2019, and had also been treated with the long-acting paliperidone injection last on 01/03/2018. She has a longstanding history of schizophrenia, vitamin D deficiency, and paranoia induced anorexia. On evaluation this morning she is clearly paranoid. She stated she wants to prepare her own food because she knows that it would be poisoned or harmful to her. She refuses to talk about any of her medications, and refuses to take any p.o. nutrition. The last psychiatric admission that she was at our facility apparently was in 03/2015. She was hospitalized for 3 weeks secondary to schizophrenia. Her discharge medications at that time included Tegretol 300 mg p.o. twice daily and the long-acting paliperidone injection. She refused oral medications last night. And ended up receiving intramuscular Haldol. She had been written for Risperdal because in the past she had refused Haldol, so today we have switched her to oral paliperidone and we will give her the long-acting paliperidone injection this morning. She was admitted to the hospital for evaluation and stabilization.  Admission Notes (H&P) on 04/02/2020- Patient is seen and examined. Patient is a 59 year old female with a past psychiatric history significant for schizophrenia who was originally admitted to the behavioral health hospital from the [facility] emergency department on 03/02/2020. She had originally presented there on 8/12 under involuntary commitment. She was placed under involuntary commitment secondary to reportedly having auditory and visual hallucinations and had been violent. The patient has a longstanding history of schizophrenia, and had actually been hospitalized at [facility] hospital in the past. She had previously been prescribed haloperidol as well as the haloperidol decanoate long-acting injection. She was brought to the hospital with the intention of starting  her on the long-acting paliperidone injection. She was given that. Unfortunately during the early part of the hospitalization she remains significantly paranoid, and her paranoia led to such a degree that she refused to eat. She was titrated on several antipsychotics which had no effect on her paranoia and as well continue to have problems with her paranoid induced anorexia. Because of her lack of p.o. intake she was sent to the [facility] previously on 2 occasions. She received IV fluids, and then was returned to our unit. She also had some significant hypokalemia. She was once again returned to the [facility] emergency department secondary to hypotension as well as poor p.o. intake on 03/25/2020. She required medical hospitalization at that time. She was hypotensive with systolic blood pressures in the 60s. Her urinalysis showed ketones and showed no evidence of infection. Her psychiatric medications were continued, and she was given IV fluids and she did take liquid supplemental protein during the course of her hospitalization there. She was returned to our facility on 04/01/2020. Her weight was up at 53 kg. On examination today she remains paranoid, she is still paranoid about her food, but has taken the boost supplements. She received a total of Haldol Decanoate 200 mg IM approximately on (  on 03/11/2020  and another 100mg  on 03/15/2020). She received the long-acting paliperidone injection on 03/02/2020. These medications including Risperdal, Prolixin, oral Haldol and Caplyta have had essentially no effect on her paranoia towards food. After she was stabilized medically at [facility] she was transferred back to our facility for continued care. On examination this morning she appears to be essentially unchanged. She is paranoid, irritable but nursing does state that she has been drinking boost. Her vital signs are stable this morning. Her  blood pressure is 98/62, pulse was 85. Her most recent weight this a.m. was 53.524 kg and her BMI was 20.24. She slept 6.5 hours.  During inpatient stay, Pt was put on 15 minutes checks. [facility], Pt was started on Cogentin 0.5 mg p.o. twice daily for side effects of medication, haloperidol 5 mg BID initially which was increased to 10 mg IM or p.o. 3 times daily for psychosis and  trazodone 50 mg p.o. nightly. She received a total of Haldol Decanoate 200 mg IM approximately on (100mg  on 03/11/2020 and another 100mg  on 03/15/2020). She received the long-acting paliperidone injection on 03/02/2020. Pt was also given Cogentin 0.5mg   BID PRN, Marinol 2.5 mg BID Before lunch and Supper, Boost TID Between meals, Eldertonic Liquid 15 ml oral daily, Haldol 2mg  /ml Solution- 5 ml Oral TID, Lumaterperone Tosylate caps 42 mg Daily, Megace 40mg  Oral 4 times daily, Remeron 15 mg QHS , Hydroxyzine 25 mg TID PRN for Anxiety, Trazodone 50 mg QHS PRN for sleep. During her stay Pt refused to eat and drank minimally seconadary to paranoia. Pt remained paranoid through out [facility]. Pt was transferred to ER three times secondary to hypovolemia and low blood pressure. In the ER, her psychiatric medications were continued, and she was given IV fluids and she did take liquid supplemental protein during the course of her hospitalization there. During her stay Patient did not display any dangerous, violent or suicidal behavior on the unit.  Pt interacted with patients & staff appropriately, participated appropriately in the group sessions/therapies. Pt's medications were addressed & adjusted to meet her needs. Pt was recommended for outpatient follow-up care & medication management upon discharge to assure her continuity of care.   At the time of discharge, patient is not reporting any acute suicidal/homicidal ideations. We encourged patient to eat and drink but Pt remained paranoid. CSW contacted her sister  but she is unable to provide any support to her due to her own medical condition. She stated that this patient does have an ACT Team who can assist with monitoring her, however this patient will not allow them into the house and wears clothes that are hard to monitor weight loss/gain. Pt states that she would eat and drink at her home but won't eat anything. Pt denies  Any side effects from medications and have been tolerating them well.  Pt currently denies any new issues or concerns. Education and supportive counseling provided throughout her hospital stay & upon discharge. Pt will followed up by her ACT Team and at [facility] follow up. Pt will receive 200 mg IM Haldol Decanoate on 04/16/2020.   Physical Findings: AIMS:  , ,  ,  ,    CIWA:    COWS:     Musculoskeletal: Strength & Muscle Tone: within normal limits Gait & Station: normal Patient leans: [name]  Psychiatric Specialty Exam: Physical Exam Vitals and nursing note reviewed.  Constitutional:      General: She is  not in acute distress.    Appearance: Normal appearance. She is not toxic-appearing or diaphoretic.  HENT:     Head: Normocephalic and atraumatic.  Cardiovascular:     Pulses: Normal pulses.  Neurological:     General: No focal deficit present.     Mental Status: She is alert and oriented to person, place, and time.     Review of Systems  Constitutional: Negative for activity change and appetite change.  Eyes: Negative for visual disturbance.  Respiratory: Negative for chest tightness and shortness of breath.   Cardiovascular: Negative for chest pain and palpitations.  Neurological: Negative for dizziness, light-headedness and headaches.  Psychiatric/Behavioral: Negative for dysphoric mood, hallucinations and suicidal ideas. The patient is not nervous/anxious.     Blood pressure (!) 82/60, pulse 89, temperature 98.4 F (36.9 C), temperature source Oral, resp. rate 16, height 5\' 4"  (1.626 m), weight 52.2 kg, SpO2 99  %.Body mass index is 19.74 kg/m.  General Appearance: Casual  Eye Contact:  Fair  Speech:  Normal Rate  Volume:  Decreased  Mood:  Anxious  Affect:  Flat  Thought Process:  Goal Directed and Descriptions of Associations: Loose  Orientation:  Full (Time, Place, and Person)  Thought Content:  Delusions and Paranoid Ideation  Suicidal Thoughts:  No  Homicidal Thoughts:  No  Memory:  Immediate;   Fair Recent;   Fair Remote;   Fair  Judgement:  Intact  Insight:  Lacking  Psychomotor Activity:  Decreased  Concentration:  Concentration: Fair and Attention Span: Fair  Recall:  of Knowledge:  Fair  Language:  Good  Akathisia:  Negative  Handed:  Right  AIMS (if indicated):     Assets:  Desire for Improvement Housing Resilience  ADL's:  Intact  Cognition:  WNL  Sleep:  Number of Hours: 6.75        Has this patient used any form of tobacco in the last 30 days? (Cigarettes, Smokeless Tobacco, Cigars, and/or Pipes) Yes, No  Blood Alcohol level:  Lab Results  Component Value Date   ETH <10 02/29/2020   ETH <10 10/04/2019    Metabolic Disorder Labs:  Lab Results  Component Value Date   HGBA1C 5.7 (H) 03/14/2020   MPG 116.89 03/14/2020   MPG 128 04/16/2015   Lab Results  Component Value Date   PROLACTIN 13.6 04/16/2015   Lab Results  Component Value Date   CHOL 186 04/16/2015   TRIG 64 04/16/2015   HDL 55 04/16/2015   CHOLHDL 3.4 04/16/2015   VLDL 13 04/16/2015   LDLCALC 118 (H) 04/16/2015    See Psychiatric Specialty Exam and Suicide Risk Assessment completed by Attending Physician prior to discharge.  Discharge destination:  Home  Is patient on multiple antipsychotic therapies at discharge:  No   Has Patient had three or more failed trials of antipsychotic monotherapy by history:  No  Recommended Plan for Multiple Antipsychotic Therapies: NA   Allergies as of 04/04/2020      Reactions   Aspirin Other (See Comments)   Possibly caused hives  per sister   Geodon [ziprasidone Hcl] Other (See Comments)   Caused twitching      Medication List    STOP taking these medications   aluminum-magnesium hydroxide-simethicone 200-200-20 MG/5ML Susp Commonly known as: MAALOX   BOOST PO   dronabinol 5 MG capsule Commonly known as: MARINOL   haloperidol 10 MG tablet Commonly known as: HALDOL   Lumateperone Tosylate 42 MG Caps  multivitamin with minerals tablet   paliperidone 6 MG 24 hr tablet Commonly known as: INVEGA   traZODone 50 MG tablet Commonly known as: DESYREL   TYLENOL PO     TAKE these medications     Indication  benztropine 0.5 MG tablet Commonly known as: COGENTIN Take 1 tablet (0.5 mg total) by mouth 2 (two) times daily as needed for tremors.  Indication: Extrapyramidal Reaction caused by Medications   geriatric multivitamins-minerals Liqd Take 15 mLs by mouth daily. Start taking on: April 05, 2020  Indication: Supplement   haloperidol 2 MG/ML solution Commonly known as: HALDOL Take 2.5 mLs (5 mg total) by mouth 3 (three) times daily.  Indication: Psychosis   hydrOXYzine 25 MG tablet Commonly known as: ATARAX/VISTARIL Take 1 tablet (25 mg total) by mouth 3 (three) times daily as needed for anxiety.  Indication: Feeling Anxious   megestrol 40 MG tablet Commonly known as: MEGACE Take 1 tablet (40 mg total) by mouth 4 (four) times daily. What changed: when to take this  Indication: General Weight Loss and Wasting   mirtazapine 15 MG tablet Commonly known as: REMERON Take 1 tablet (15 mg total) by mouth at bedtime. What changed:   medication strength  how much to take  Indication: Mood control       Follow-up Information    [facility] Follow up.   Why: Appointment is scheduled for 04/16/20 at 10:30am to complete intake paperwork.  Contact information: [address] [place] [address] [phone]               Follow-up recommendations:   Activity:  As tolerated. Diet:  As recommended by your PCP.  Comments: Pt will be followed up by her ACT  Team. She received a total of Haldol Decanoate 200 mg IM approximately on (100mg  on 03/11/2020 and another 100mg  on 03/15/2020). She received the long-acting paliperidone injection on 03/02/2020. Pt will receive 200 mg IM Haldol Decanoate on 04/16/2020.  Prescriptions given at discharge. Patient agreeable to plan.  Given opportunity to ask questions.  Appears to feel comfortable with discharge denies any current suicidal or homicidal thought. Patient is also instructed prior to discharge to: Take all medications as prescribed by her mental healthcare provider. Report any adverse effects and or reactions from the medicines to her outpatient provider promptly. Patient has been instructed & cautioned: To not engage in alcohol and or illegal drug use while on prescription medicines. In the event of worsening symptoms, patient is instructed to call the crisis hotline, 911 and or go to the nearest ED for appropriate evaluation and treatment of symptoms. To follow-up with her primary care provider for your other medical issues, concerns and or health care needs.  Signed: 03/04/2020, MD 04/04/2020, 7:03 PM

## 2020-10-01 ENCOUNTER — Other Ambulatory Visit: Payer: Self-pay

## 2020-10-01 ENCOUNTER — Ambulatory Visit (HOSPITAL_COMMUNITY)
Admission: EM | Admit: 2020-10-01 | Discharge: 2020-10-05 | Disposition: A | Discharge status: Other Acute Inpatient Hospital | Payer: Medicare Other | Attending: Psychiatry | Admitting: Psychiatry

## 2020-10-01 DIAGNOSIS — F17210 Nicotine dependence, cigarettes, uncomplicated: Secondary | ICD-10-CM | POA: Insufficient documentation

## 2020-10-01 DIAGNOSIS — Z20822 Contact with and (suspected) exposure to covid-19: Secondary | ICD-10-CM | POA: Diagnosis not present

## 2020-10-01 DIAGNOSIS — R451 Restlessness and agitation: Secondary | ICD-10-CM | POA: Insufficient documentation

## 2020-10-01 DIAGNOSIS — Z9119 Patient's noncompliance with other medical treatment and regimen: Secondary | ICD-10-CM | POA: Diagnosis not present

## 2020-10-01 DIAGNOSIS — Z7289 Other problems related to lifestyle: Secondary | ICD-10-CM | POA: Insufficient documentation

## 2020-10-01 DIAGNOSIS — R454 Irritability and anger: Secondary | ICD-10-CM | POA: Diagnosis not present

## 2020-10-01 DIAGNOSIS — F200 Paranoid schizophrenia: Secondary | ICD-10-CM | POA: Diagnosis not present

## 2020-10-01 DIAGNOSIS — Z79899 Other long term (current) drug therapy: Secondary | ICD-10-CM | POA: Insufficient documentation

## 2020-10-01 DIAGNOSIS — R630 Anorexia: Secondary | ICD-10-CM | POA: Diagnosis not present

## 2020-10-01 LAB — HEMOGLOBIN A1C
Hgb A1c MFr Bld: 5.7 % — ABNORMAL HIGH (ref 4.8–5.6)
Mean Plasma Glucose: 116.89 mg/dL

## 2020-10-01 LAB — COMPREHENSIVE METABOLIC PANEL
ALT: 16 U/L (ref 0–44)
AST: 24 U/L (ref 15–41)
Albumin: 4.3 g/dL (ref 3.5–5.0)
Alkaline Phosphatase: 67 U/L (ref 38–126)
Anion gap: 9 (ref 5–15)
BUN: 16 mg/dL (ref 6–20)
CO2: 24 mmol/L (ref 22–32)
Calcium: 9.1 mg/dL (ref 8.9–10.3)
Chloride: 105 mmol/L (ref 98–111)
Creatinine, Ser: 1.24 mg/dL — ABNORMAL HIGH (ref 0.44–1.00)
GFR, Estimated: 50 mL/min — ABNORMAL LOW (ref >60)
Glucose, Bld: 94 mg/dL (ref 70–99)
Potassium: 3.9 mmol/L (ref 3.5–5.1)
Sodium: 138 mmol/L (ref 135–145)
Total Bilirubin: 0.5 mg/dL (ref 0.3–1.2)
Total Protein: 7.3 g/dL (ref 6.5–8.1)

## 2020-10-01 LAB — ETHANOL: Alcohol, Ethyl (B): <10 mg/dL (ref <10)

## 2020-10-01 LAB — CBC WITH DIFFERENTIAL/PLATELET
Abs Immature Granulocytes: 0.01 10*3/uL (ref 0.00–0.07)
Basophils Absolute: 0 10*3/uL (ref 0.0–0.1)
Basophils Relative: 0 %
Eosinophils Absolute: 0 10*3/uL (ref 0.0–0.5)
Eosinophils Relative: 0 %
HCT: 40.2 % (ref 36.0–46.0)
Hemoglobin: 13.7 g/dL (ref 12.0–15.0)
Immature Granulocytes: 0 %
Lymphocytes Relative: 11 %
Lymphs Abs: 0.8 10*3/uL (ref 0.7–4.0)
MCH: 29.2 pg (ref 26.0–34.0)
MCHC: 34.1 g/dL (ref 30.0–36.0)
MCV: 85.7 fL (ref 80.0–100.0)
Monocytes Absolute: 0.3 10*3/uL (ref 0.1–1.0)
Monocytes Relative: 4 %
Neutro Abs: 6.4 10*3/uL (ref 1.7–7.7)
Neutrophils Relative %: 85 %
Platelets: 205 10*3/uL (ref 150–400)
RBC: 4.69 MIL/uL (ref 3.87–5.11)
RDW: 13.2 % (ref 11.5–15.5)
WBC: 7.6 10*3/uL (ref 4.0–10.5)
nRBC: 0 % (ref 0.0–0.2)

## 2020-10-01 LAB — RESP PANEL BY RT-PCR (FLU A&B, COVID) ARPGX2
Influenza A by PCR: NEGATIVE
Influenza B by PCR: NEGATIVE
SARS Coronavirus 2 by RT PCR: NEGATIVE

## 2020-10-01 LAB — LIPID PANEL
Cholesterol: 171 mg/dL (ref 0–200)
HDL: 64 mg/dL (ref >40)
LDL Cholesterol: 93 mg/dL (ref 0–99)
Total CHOL/HDL Ratio: 2.7 RATIO
Triglycerides: 68 mg/dL (ref <150)
VLDL: 14 mg/dL (ref 0–40)

## 2020-10-01 LAB — TSH: TSH: 2.647 u[IU]/mL (ref 0.350–4.500)

## 2020-10-01 LAB — POC SARS CORONAVIRUS 2 AG -  ED: SARS Coronavirus 2 Ag: NEGATIVE

## 2020-10-01 MED ORDER — MAGNESIUM HYDROXIDE 400 MG/5ML PO SUSP: 30.0000 mL | Freq: Every day | ORAL | Status: DC | PRN

## 2020-10-01 MED ORDER — BENZTROPINE MESYLATE 1 MG PO TABS: 1.0000 mg | ORAL_TABLET | Freq: Four times a day (QID) | ORAL | Status: DC | PRN

## 2020-10-01 MED ORDER — HALOPERIDOL 5 MG PO TABS
5.0000 mg | ORAL_TABLET | Freq: Two times a day (BID) | ORAL | Status: DC
  Filled 2020-10-01 (×2): qty 1

## 2020-10-01 MED ORDER — HALOPERIDOL 5 MG PO TABS: 5.0000 mg | ORAL_TABLET | Freq: Four times a day (QID) | ORAL | Status: DC | PRN

## 2020-10-01 MED ORDER — ALUM & MAG HYDROXIDE-SIMETH 200-200-20 MG/5ML PO SUSP: 30.0000 mL | ORAL | Status: DC | PRN

## 2020-10-01 MED ORDER — ACETAMINOPHEN 325 MG PO TABS: 650.0000 mg | ORAL_TABLET | Freq: Four times a day (QID) | ORAL | Status: DC | PRN

## 2020-10-01 MED ORDER — HALOPERIDOL LACTATE 5 MG/ML IJ SOLN
5.0000 mg | Freq: Two times a day (BID) | INTRAMUSCULAR | Status: DC
  Administered 2020-10-03 – 2020-10-04 (×2): 5 mg via INTRAMUSCULAR
  Filled 2020-10-01 (×2): qty 1

## 2020-10-01 MED ORDER — TRAZODONE HCL 50 MG PO TABS: 50.0000 mg | ORAL_TABLET | Freq: Every evening | ORAL | Status: DC | PRN

## 2020-10-01 NOTE — ED Notes (Signed)
Pt declined offer for food or drink. Pt continues to refuse to have vital signs taken. Pt states she "just wants out of here." Will continue to monitor for safety.
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
## 2020-10-01 NOTE — ED Notes (Signed)
Pt A& O x 4, sitting in chair, remains guarded and irritable, withdrawn.  No distress noted. Calm at present.  Monitoring for safety.

## 2020-10-01 NOTE — ED Notes (Signed)
Pt belongings in locker #25.  

## 2020-10-01 NOTE — ED Notes (Signed)
Pt adamantly refused vital signs, instructing staff to leave her alone.

## 2020-10-01 NOTE — Progress Notes (Signed)
Received [name] on the unit after her assessment, she was cooperative with the blood work and Covid, but refused the EKG and urine. She was oriented to her new environment and offered nourishments. She refused to verbalize during the one to one assessment.

## 2020-10-01 NOTE — ED Notes (Signed)
Pt refused vitals 

## 2020-10-01 NOTE — ED Notes (Signed)
Pt remains sitting up in chair at bedside, continues to refuse vital signs or nutrition offered.  Remains irritable and calm.  Monitoring for safety.

## 2020-10-02 DIAGNOSIS — R451 Restlessness and agitation: Secondary | ICD-10-CM | POA: Diagnosis not present

## 2020-10-02 DIAGNOSIS — Z9119 Patient's noncompliance with other medical treatment and regimen: Secondary | ICD-10-CM | POA: Diagnosis not present

## 2020-10-02 DIAGNOSIS — Z79899 Other long term (current) drug therapy: Secondary | ICD-10-CM | POA: Diagnosis not present

## 2020-10-02 DIAGNOSIS — R630 Anorexia: Secondary | ICD-10-CM | POA: Diagnosis not present

## 2020-10-02 DIAGNOSIS — Z7289 Other problems related to lifestyle: Secondary | ICD-10-CM | POA: Diagnosis not present

## 2020-10-02 DIAGNOSIS — F17210 Nicotine dependence, cigarettes, uncomplicated: Secondary | ICD-10-CM | POA: Diagnosis not present

## 2020-10-02 DIAGNOSIS — R454 Irritability and anger: Secondary | ICD-10-CM | POA: Diagnosis not present

## 2020-10-02 DIAGNOSIS — F200 Paranoid schizophrenia: Secondary | ICD-10-CM | POA: Diagnosis present

## 2020-10-02 DIAGNOSIS — Z20822 Contact with and (suspected) exposure to covid-19: Secondary | ICD-10-CM | POA: Diagnosis not present

## 2020-10-02 MED ORDER — HALOPERIDOL DECANOATE 100 MG/ML IM SOLN
100.0000 mg | Freq: Once | INTRAMUSCULAR | Status: AC
  Administered 2020-10-02: 100 mg via INTRAMUSCULAR
  Filled 2020-10-02: qty 1

## 2020-10-02 NOTE — ED Notes (Signed)
Pt sitting in the chair talking to herself and shaking her head as if she is agreeing to someone. Safety maintained and will continue to monitor.

## 2020-10-02 NOTE — ED Notes (Signed)
Pt calm and cooperative. Alert and orient x 4. Pt refuses meals, drinks and medication. Will continue to monitor for safety

## 2020-10-02 NOTE — ED Provider Notes (Signed)
[facility] Second Physician Opinion Progress Note for Medication Administration to Non-consenting Patients (For Involuntarily Committed Patients)  Patient: [name] Date of Birth: [id number] MRN: [id number]  Reason for the Medication: The patient, without the benefit of the specific treatment measure, is incapable of participating in any available treatment plan that will give the patient a realistic opportunity of improving the patient's condition.  Consideration of Side Effects: Consideration of the side effects related to the medication plan has been given.  Rationale for Medication Administration: Patient continues to be paranoid, is not eating or drinking while on the unit. She is refusing medications. On interview with me today she is paranoid. She denies having mental illness and states that she "isn't here for that" and does not need medications. Patient has a history of anorexia while psychotic and this presentation is consistent with that. If patient's psychotic symptoms remain untreated, she will decompensate both from a psychiatric and physical stand point.     Estella Husk, MD 10/02/20  1:32 PM   This documentation is good for (7) seven days from the date of the MD signature. New documentation must be completed every seven (7) days with detailed justification in the medical record if the patient requires continued non-emergent administration of psychotropic medications.

## 2020-10-02 NOTE — ED Notes (Signed)
Attempted to get vitals and EKG. Pt refused and stated " I just want to go home".

## 2020-10-02 NOTE — ED Notes (Signed)
Offered pt meal and something to drink. Refused.

## 2020-10-02 NOTE — ED Notes (Signed)
Offered pt food; refused.

## 2020-10-02 NOTE — ED Provider Notes (Signed)
Patient is seen and examined.  Patient is a 60 year old female with a past psychiatric history significant for schizophrenia.  She was brought in on 10/01/2020 by her family under involuntary commitment.  The patient was last hospitalized at our facility in late 2021.  Her family members told me yesterday that she had not taken any psychiatric medications since that discharge.  She has a longstanding history of difficult to treat schizophrenia.  She has a long history of noncompliance.  She has a [facility] of [facility] of [facility], and then discharged, then rehospitalized at the [facility], and then eventually to the behavioral health hospital.  She is currently refusing medications.  Given the seriousness of her illness, and the need for treatment I have asked Dr. Bronwen Betters to render a second opinion for forced medications.  I believe that forced medications are necessary at this point to prevent further deterioration and worsening of her condition.  It is felt that without these medications she will continue to deteriorate.  She has a severe history of anorexia when significantly paranoid, and on her last hospitalization (that was greater than 30 days) she required transfer back and forth to the [facility] emergency room as well as the [facility] for IV fluids and to treat hypotension.  We will give her the injectable Haldol as written, and as well the Haldol Decanoate 100 mg IM.  The plan will most likely be to give her an additional 100 mg of decanoate in 3 or 4 days, and hopefully this will be while she is on an inpatient facility.

## 2020-10-02 NOTE — ED Notes (Signed)
Pt sitting in the chair beside pull out bed. Refused morning meds and stated, "I don't want anything, I want to go home. I don't need to be here". Safety maintained and will continue to monitor.

## 2020-10-02 NOTE — ED Notes (Signed)
Pt remains sitting up at bedside, no distress noted, withdrawn and guarded.  Monitoring for safety.

## 2020-10-02 NOTE — ED Notes (Addendum)
IM injection admin of haldol/decanoate to the R deltoid. Pt stated to staff she was "going to sue" because she was being made to take meds. Pt continues to sit in the chair at the pull out bed. Staff continues to offer fluids and food, Pt continues to refuse. Safety maintained and will continue to monitor.

## 2020-10-02 NOTE — ED Notes (Signed)
Pt offered several times something to drink or eat, continues to refuse and sit in the chair. Continues to stated that she wants to go home. Safety maintained and will continue to monitor.

## 2020-10-02 NOTE — ED Notes (Signed)
Pt sleeping@this time. Breathing even and unlabored. Will continue to monitor for safety 
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
## 2020-10-02 NOTE — Progress Notes (Signed)
Per Dr. Jola Babinski, faxed out due to nurse staffing issues at [facility].   Pt. meets criteria for inpatient treatment per Reola Calkins, NP. Referred out to the following hospitals:   Destination  Service Provider Address Phone Fax  [facility]  [address], [place] [address] [phone]  [facility]  [address], [place] [address] [phone]  [facility]  [address] [place], [place] [address] [phone]  [facility]  [address] [place] [address] [phone]  [facility]  [address] [place], [place] [address] [phone]  [facility]  [address] [place] [address] [phone]  [facility]  [address] [place] [address] [phone]  [facility]  [address] [place] [address] [phone]  [facility]  [address] [place] [address] [phone]  [facility]  [address] [place] [address] [phone]  [facility]  [address], [place] [address] [phone]  [facility]  [address] [place] [address] [phone]  [facility]  [address], [place] [address] [phone]  [facility]  [address], [place] [address] [phone]  [facility]  [address] [place] [address] [phone]  [facility]  [address], [place] [address] [phone]  [facility]  [address], [place] [address] [phone]  [facility]  [address], [place] [address] [phone]  [facility]  [address], [place] [address] [phone]  [facility]  [address] [place] [address] [phone]  [facility]  [address] [place] [address] [phone]  [facility]  [address] [place] [phone]  [facility]  [address] [place] [address] [phone]  [facility]  [address], [place] [address] [phone]  [facility] [address]  [address] [place] [address] [phone]  [facility]  [address], [place] [address] [phone]  [facility]  [address] [place] [address] [phone]  [facility]  [address] [place] [address] [phone]  [facility]  [address] [place] [address] [phone]  [facility]  [address] [place] [address] [phone]     Disposition CSW will continue to follow for placement.  Signed:  Corky Crafts, MSW, [place], LCASA 10/02/2020 10:41 AM

## 2020-10-02 NOTE — ED Notes (Signed)
Offered pt a snack; refused.

## 2020-10-03 DIAGNOSIS — F200 Paranoid schizophrenia: Secondary | ICD-10-CM | POA: Diagnosis not present

## 2020-10-03 MED ORDER — HALOPERIDOL DECANOATE 100 MG/ML IM SOLN
100.0000 mg | Freq: Once | INTRAMUSCULAR | Status: AC
  Administered 2020-10-04: 100 mg via INTRAMUSCULAR
  Filled 2020-10-03: qty 1

## 2020-10-03 NOTE — ED Notes (Signed)
Pt refused PO medications and injections

## 2020-10-03 NOTE — ED Notes (Signed)
Pt sleeping@this time. breathing even and unlabored. Will continue to monitor for safety 

## 2020-10-03 NOTE — ED Notes (Signed)
Pt sleeping@this time. Breathing even and unlabored. Will continue to monitor for safety 

## 2020-10-03 NOTE — ED Notes (Signed)
Writer attempted to get vitals this morning but pt refused. Writer also provided pt with breakfast options and pt declined.
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
## 2020-10-03 NOTE — ED Notes (Addendum)
patient refuses to eat or drink. multiple staff offered food and drink many times and she refused

## 2020-10-03 NOTE — ED Notes (Signed)
Accepted Haldol IM w/o difficulty. Pt is sitting in the chair beside the pull out bed looking down at the floor talking to herself. Staff can see her lips moving and hear mumbling at times. Pt continues to refuse fluids or solids. Refused to allow staff  To obtain vs. Safety maintained and will continue to monitor.

## 2020-10-03 NOTE — Progress Notes (Signed)
Patient information has been sent to [facility] via secure chat to review for potential admission. Patient meets inpatient criteria per Reola Calkins, NP .   Situation ongoing, CSW will continue to monitor progress.    Signed:  Corky Crafts, MSW, [place], LCASA 10/03/2020 10:28 AM

## 2020-10-03 NOTE — ED Notes (Signed)
Writer asked pt if she would like anything to eat or drink and pt declined. Writer offered juice options or a sandwich. Pt requesting to go home where she can care for herself because she cannot do that here. Pt is sitting at bedside and speaking to herself.

## 2020-10-03 NOTE — Progress Notes (Signed)
Due to [facility] nurse staffing shortage, patient was referred out at this time per instruction from Dr. Jola Babinski, Interim Medical Director.    Pt. meets criteria for inpatient treatment per Reola Calkins, NP. Referred out to the following hospitals:   Destination  Service Provider Address Phone Fax  [facility]  [address], [place] [address] [phone]  [facility]  [address] [place] [address] [phone]  [facility]  [address] [place], [place] [address] [phone]  [facility]  [address] [place] [address] [phone]  [facility]  [address] [place], [place] [address] [phone]  [facility]  [address] [place] [address] [phone]  [facility]  [address] [place] [address] [phone]  [facility]  [address] [place] [address] [phone]  [facility]  [address] [place] [address] [phone]  [facility]  [address] [place] [address] [phone]  [facility]  [address] [place] [address] [phone]  [facility]  [address] [place] [address] [phone]  [facility]  [address] [place] [address] [phone]  [facility]  [address] [place] [address] [phone]  [facility]  [address], [place] [address] [phone]  [facility]  [address] [place] [address] [phone]  [facility]  [address], [place] [address]  [phone]  [facility]  [address], [place] [address] [phone]  [facility]  [address], [place] [address] [phone]  [facility]  [address] [place] [address] [phone]  [facility]  [address] [place] [address] [phone]  [facility]  [address] [place] [address] [phone]  [facility]  [address] [place] [address] [phone]  [facility]  [address] [place] [address] [phone]  [facility] [address]  [address], [place] [address] [phone]  [facility]  [address], [place] [address] [phone]  [facility]  [address] [place] [address] [phone]  [facility]  [address] [place] [address] [phone]  [facility]  [address] [place] [address] [phone]  [facility]  [address] [place] [address] [phone]     Disposition CSW will continue to follow for placement.  Signed:  Corky Crafts, MSW, [place], LCASA 10/03/2020 2:46 PM

## 2020-10-04 DIAGNOSIS — F200 Paranoid schizophrenia: Secondary | ICD-10-CM

## 2020-10-04 LAB — POC SARS CORONAVIRUS 2 AG: SARS Coronavirus 2 Ag: NEGATIVE

## 2020-10-04 LAB — POC SARS CORONAVIRUS 2 AG -  ED: SARS Coronavirus 2 Ag: NEGATIVE

## 2020-10-04 NOTE — ED Notes (Signed)
Offered pt a meal and something to drink; refused.

## 2020-10-04 NOTE — ED Notes (Signed)
Pt sleeping@this time. Breathing even and unlabored. Will continue to monitor for safety 

## 2020-10-04 NOTE — ED Notes (Signed)
Pt continue to refuse oral medication prescribed by MD, given IM injections per force medication parameter. Offered pt meal and drink but pt continue to refuse all services. NP made aware. Will continue to monitor for safety.

## 2020-10-04 NOTE — ED Notes (Signed)
Offered pt meal and drink, pt refused
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
## 2020-10-04 NOTE — ED Notes (Signed)
Pt sleeping@this time. breathing even and unlabored. Will continue to monitor for safety 

## 2020-10-04 NOTE — ED Notes (Signed)
Pt sleeping in no acute distress. RR even and unlabored. Safety maintained. 

## 2020-10-04 NOTE — ED Notes (Signed)
Pt sitting up in chair agitated. Non-communicative with staff. No concerns voiced. Will continue to monitor for safety.

## 2020-10-04 NOTE — ED Notes (Signed)
Pt refused meal. 

## 2020-10-04 NOTE — ED Notes (Signed)
Pt resting with her eyes open. Pt calm and cooperative. Alert and orient x 4. Will continue to monitor for safety

## 2020-10-04 NOTE — Progress Notes (Addendum)
Addendum No appropriate beds available at [facility], per AC/RN. CSW will continue to follow disposition.   Signed:  Corky Crafts, MSW, [place], LCASA 10/04/2020 11:07 AM  Patient information has been sent to [facility] via secure chat to review for potential admission. Patient meets inpatient criteria per Reola Calkins, NP.   Situation ongoing, CSW will continue to monitor progress.    Signed:  Corky Crafts, MSW, [place], LCASA 10/04/2020 9:56 AM

## 2020-10-04 NOTE — ED Notes (Signed)
Pt refused vitals and EKG 

## 2020-10-04 NOTE — Progress Notes (Signed)
Disposition CSW received call from [facility], reported she has used all 190 medicare days of inpt psychiatric treatment. Patient would need to be self pay if admitted. Estimated cost would be $4500 up front.   $Signed:  Corky Crafts, MSW, [place], LCASA 10/04/2020 9:44 AM

## 2020-10-04 NOTE — ED Notes (Signed)
Pt refuse vitals, and did not want any breakfast.

## 2020-10-05 ENCOUNTER — Inpatient Hospital Stay (HOSPITAL_COMMUNITY)
Admission: AD | Admit: 2020-10-05 | Discharge: 2020-10-09 | DRG: 885 | Disposition: A | Discharge status: Home / Self Care | Payer: Medicare Other | Source: Intra-hospital | Attending: Psychiatry | Admitting: Psychiatry

## 2020-10-05 ENCOUNTER — Other Ambulatory Visit: Payer: Self-pay

## 2020-10-05 ENCOUNTER — Encounter (HOSPITAL_COMMUNITY): Payer: Self-pay | Admitting: Psychiatry

## 2020-10-05 DIAGNOSIS — G4700 Insomnia, unspecified: Secondary | ICD-10-CM | POA: Diagnosis present

## 2020-10-05 DIAGNOSIS — F32A Depression, unspecified: Secondary | ICD-10-CM | POA: Diagnosis present

## 2020-10-05 DIAGNOSIS — Z20822 Contact with and (suspected) exposure to covid-19: Secondary | ICD-10-CM | POA: Diagnosis present

## 2020-10-05 DIAGNOSIS — Z9111 Patient's noncompliance with dietary regimen: Secondary | ICD-10-CM

## 2020-10-05 DIAGNOSIS — F17210 Nicotine dependence, cigarettes, uncomplicated: Secondary | ICD-10-CM | POA: Diagnosis not present

## 2020-10-05 DIAGNOSIS — Z79899 Other long term (current) drug therapy: Secondary | ICD-10-CM | POA: Diagnosis not present

## 2020-10-05 DIAGNOSIS — I9589 Other hypotension: Secondary | ICD-10-CM | POA: Diagnosis not present

## 2020-10-05 DIAGNOSIS — Z9114 Patient's other noncompliance with medication regimen: Secondary | ICD-10-CM

## 2020-10-05 DIAGNOSIS — F200 Paranoid schizophrenia: Secondary | ICD-10-CM | POA: Diagnosis not present

## 2020-10-05 LAB — RAPID URINE DRUG SCREEN, HOSP PERFORMED
Amphetamines: NOT DETECTED
Barbiturates: NOT DETECTED
Benzodiazepines: NOT DETECTED
Cocaine: NOT DETECTED
Opiates: NOT DETECTED
Tetrahydrocannabinol: NOT DETECTED

## 2020-10-05 LAB — PREGNANCY, URINE: Preg Test, Ur: NEGATIVE

## 2020-10-05 MED ORDER — ACETAMINOPHEN 325 MG PO TABS: 650.0000 mg | ORAL_TABLET | Freq: Four times a day (QID) | ORAL | Status: DC | PRN

## 2020-10-05 MED ORDER — MAGNESIUM HYDROXIDE 400 MG/5ML PO SUSP: 30.0000 mL | Freq: Every day | ORAL | Status: DC | PRN

## 2020-10-05 MED ORDER — BOOST / RESOURCE BREEZE PO LIQD CUSTOM
1.0000 | Freq: Three times a day (TID) | ORAL | Status: DC
  Administered 2020-10-05 – 2020-10-08 (×11): 1 via ORAL
  Filled 2020-10-05 (×12): qty 1

## 2020-10-05 MED ORDER — BENZTROPINE MESYLATE 0.5 MG PO TABS
0.5000 mg | ORAL_TABLET | Freq: Two times a day (BID) | ORAL | Status: DC
  Administered 2020-10-05 – 2020-10-09 (×8): 0.5 mg via ORAL
  Filled 2020-10-05 (×10): qty 1

## 2020-10-05 MED ORDER — MEGESTROL ACETATE 40 MG PO TABS: 40.0000 mg | ORAL_TABLET | Freq: Every day | ORAL | Status: DC

## 2020-10-05 MED ORDER — TRAZODONE HCL 50 MG PO TABS: 50.0000 mg | ORAL_TABLET | Freq: Every evening | ORAL | Status: DC | PRN

## 2020-10-05 MED ORDER — MEGESTROL ACETATE 20 MG PO TABS
20.0000 mg | ORAL_TABLET | Freq: Every day | ORAL | Status: DC
  Filled 2020-10-05 (×3): qty 1

## 2020-10-05 MED ORDER — MIRTAZAPINE 7.5 MG PO TABS
7.5000 mg | ORAL_TABLET | Freq: Every day | ORAL | Status: DC
  Administered 2020-10-05: 7.5 mg via ORAL
  Filled 2020-10-05 (×3): qty 1

## 2020-10-05 MED ORDER — ENSURE ENLIVE PO LIQD
237.0000 mL | Freq: Two times a day (BID) | ORAL | Status: DC
  Administered 2020-10-06: 237 mL via ORAL
  Filled 2020-10-05 (×9): qty 237

## 2020-10-05 MED ORDER — ALUM & MAG HYDROXIDE-SIMETH 200-200-20 MG/5ML PO SUSP: 30.0000 mL | ORAL | Status: DC | PRN

## 2020-10-05 MED ORDER — HALOPERIDOL 5 MG PO TABS
5.0000 mg | ORAL_TABLET | Freq: Two times a day (BID) | ORAL | Status: DC
  Administered 2020-10-05 – 2020-10-09 (×9): 5 mg via ORAL
  Filled 2020-10-05 (×12): qty 1

## 2020-10-05 NOTE — Progress Notes (Signed)
Adult Psychoeducational Group Note  Date:  10/05/2020 Time:  10:38 PM  Group Topic/Focus:  Wrap-Up Group:   The focus of this group is to help patients review their daily goal of treatment and discuss progress on daily workbooks.  Participation Level:  Did Not Attend  Participation Quality:  Did Not Attend  Affect:  Did Not Attend  Cognitive:  Did Not Attend  Insight: None  Engagement in Group:  Did Not Attend  Modes of Intervention:  Did Not Attend  Additional Comments:  Pt did not attend evening wrap up group tonight.  Felipa Furnace 10/05/2020, 10:38 PM
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
## 2020-10-05 NOTE — [facility] Suicide Risk Assessment (Signed)
[facility] INPATIENT:  Family/Significant Other Suicide Prevention Education  Suicide Prevention Education: Education Completed; legal guardian Fatema Rabe [phone]), has been identified by the patient as the family member/significant other with whom the patient will be residing, and identified as the person(s) who will aid the patient in the event of a mental health crisis (suicidal ideations/suicide attempt).  With written consent from the patient, the family member/significant other has been provided the following suicide prevention education, prior to the and/or following the discharge of the patient.  The suicide prevention education provided includes the following:  Suicide risk factors  Suicide prevention and interventions  National Suicide Hotline telephone number  [facility] assessment telephone number  [facility] Emergency Assistance 911  [facility] and/or Residential Mobile Crisis Unit telephone number   Request made of family/significant other to:  Remove weapons (e.g., guns, rifles, knives), all items previously/currently identified as safety concern.    Remove drugs/medications (over-the-counter, prescriptions, illicit drugs), all items previously/currently identified as a safety concern.   The family member/significant other verbalizes understanding of the suicide prevention education information provided.  The family member/significant other agrees to remove the items of safety concern listed above.  CSW spoke with this patients sister who reported that this patient has not been taking her medications since being discharged from her last hospitalization in 2021. Per pt's sister this patient recently bought beer, which according to her mother she does on occasion.   Pt's sister stated that this patient is still receiving ACTT services through [facility], however stated her team changed and feels that they do not have the same  relationship/rapport with this patient.   This patient is able to return home to live with her mother at discharge.    Ruthann Cancer MSW, LCSW Clincal Social Worker  [facility]

## 2020-10-05 NOTE — [facility] Group Notes (Signed)
.  Psychoeducational Group Note  Date: 10-05-2020 Time: 0900-1000    Goal Setting   Purpose of Group: This group helps to provide patients with the steps of setting a goal that is specific, measurable, attainable, realistic and time specific. A discussion on how we keep ourselves stuck with negative self talk.    Participation Level:  Did not attend   Katlin Bortner A  
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
## 2020-10-05 NOTE — ED Provider Notes (Signed)
FBC/OBS ASAP Discharge Summary  Date and Time: 10/05/2020 1:02 AM  Name: [name]  MRN:  [id number]   Discharge Diagnoses:  Final diagnoses:  Paranoid schizophrenia [facility])    Stay Summary: per Dr. Mallie Darting 10/01/2020 @ 1650: Patient is a 60 year old female with a past psychiatric history significant for schizophrenia who presented to the [facility] behavioral health urgent care center under involuntary commitment.  The patient is currently agitated, not a great historian.  I am familiar with this patient who was originally admitted to the behavioral health hospital on 03/02/2020.  She was transferred from the [facility] emergency department after she had arrived there on 02/29/2020.  She had been brought in by the [facility] office under involuntary commitment.  The report at that time stated she was experiencing auditory and visual hallucinations and was violent.  She was actually hospitalized from that date until 04/04/2020.  During that hospitalization she had several visits to the emergency room as well as the medical hospital at [facility] long secondary to her anorexia.  She had a syncopal episode, and actually had a trauma to her head that was essentially negative.  She had been medically noncompliant prior to that.  She has a history of being paranoid and anorexic secondary to concerns and fears that she is being poisoned.  At that time she actually believed her sister was not her sister, and that she was being poisoned.  Throughout the course of that hospitalization she had a great deal of difficulty eating.  Because of her continued paranoia.  Her discharge medications at that time included Cogentin, haloperidol, hydroxyzine, Megace, mirtazapine and Haldol Decanoate.  Again the patient provides no additional history secondary to her psychotic symptoms.  Review of the electronic medical record as well as care everywhere did not show any additional follow-up information.  The  decision was made at the [facility] that she would need to be admitted to the hospital for evaluation and stabilization.   Per [employer] 10/02/2020: The patient continues to make minimal eye contact and is continued to refuse any treatment.  Patient has refused medications, food, as well as vital signs.  Patient is very irritable when trying to discuss with her and she raises her voice at times.  Patient continues to appear paranoid and delusional with concerns of eating food and taking medications.  After discussing with Dr. Meredith Pel is agreed to restart patient on Haldol decanoate 100 mg IM injection.  Patient may require forced medications. patient is restarted on Haldol Dec 100 mg IM with plan to titrate to 200 mg IM  Per [employer] 10/03/20 @ 14:19: Today patient continues to be irritable and refusing medications as well as food or drink.  Patient continues stating that she will only eat when she gets back home.  Patient denies any suicidal or homicidal ideations and denies any hallucinations, however patient is witnessed speaking to herself and appears to be responding to internal stimuli.  Patient continues stating that nothing is wrong with her and that she would not take medications. Patient continues to meet inpatient criteria. She received Haldol Dec 100 mg IM on 10/02/20 and is scheduled to get Haldol Dec 100 mg IM again tomorrow for a total of 200 mg.  Per [employer] 10/04/2020 @ 12:45: Patient met with today for follow-up evaluation in the close observations unit. Patient observably before our interaction, sitting to herself starring at the floor, and appearing to be responding to internal stimuli. Upon engagement the  patient remains irritable, refusing medications, as well as food and drink. Patient verbalizes she will not be taking any medications and that she won't eat or drink anything unless she makes it, because it needs to be "processed correctly for my body to accept it!". Patient continues  this assessment to deny any suicidal or homicidal ideations, auditory or visual hallucinations, and mental illness. Patient endorses she slept fair over the night. Patient received second Haldol decanoate injection of 100 mg IM. Patient has a total of 200 mg IM. Patient continues to have forced medication order. Patient has not been aggressive or agitated, but is irritable when being evaluated. Will continue to seek inpatient treatment.     Total Time spent with patient: 20 minutes  Past Psychiatric History:Paranoid schizophrenia, agitation, anorexia,  Past Medical History:  Past Medical History:  Diagnosis Date  . Non compliance w medication regimen   . Schizophrenia ([place])    No past surgical history on file. Family History: No family history on file. Family Psychiatric History:  Social History:  Social History   Substance and Sexual Activity  Alcohol Use Yes   Comment: Refuses to disclose how much     Social History   Substance and Sexual Activity  Drug Use No   Comment: Refuses to answer    Social History   Socioeconomic History  . Marital status: Single    Spouse name: Not on file  . Number of children: Not on file  . Years of education: Not on file  . Highest education level: Not on file  Occupational History  . Not on file  Tobacco Use  . Smoking status: Current Some Day Smoker    Types: Cigarettes  . Smokeless tobacco: Never Used  Vaping Use  . Vaping Use: Every day  Substance and Sexual Activity  . Alcohol use: Yes    Comment: Refuses to disclose how much  . Drug use: No    Comment: Refuses to answer  . Sexual activity: Not on file    Comment: refused to answer  Other Topics Concern  . Not on file  Social History Narrative  . Not on file   Social Determinants of Health   Financial Resource Strain: Not on file  Food Insecurity: Not on file  Transportation Needs: Not on file  Physical Activity: Not on file  Stress: Not on file  Social  Connections: Not on file   SDOH:  SDOH Screenings   Alcohol Screen: Low Risk   . Last Alcohol Screening Score (AUDIT): 0  Depression (PHQ2-9): Not on file  Financial Resource Strain: Not on file  Food Insecurity: Not on file  Housing: Not on file  Physical Activity: Not on file  Social Connections: Not on file  Stress: Not on file  Tobacco Use: High Risk  . Smoking Tobacco Use: Current Some Day Smoker  . Smokeless Tobacco Use: Never Used  Transportation Needs: Not on file    Has this patient used any form of tobacco in the last 30 days? (Cigarettes, Smokeless Tobacco, Cigars, and/or Pipes) Prescription not provided because: non smoker  Current Medications:  Current Facility-Administered Medications  Medication Dose Route Frequency Provider Last Rate Last Admin  . acetaminophen (TYLENOL) tablet 650 mg  650 mg Oral Q6H PRN Ival Bible, MD      . alum & mag hydroxide-simeth (MAALOX/MYLANTA) 200-200-20 MG/5ML suspension 30 mL  30 mL Oral Q4H PRN Ival Bible, MD      . haloperidol (HALDOL) tablet  5 mg  5 mg Oral Q6H PRN [name], MD       And  . benztropine (COGENTIN) tablet 1 mg  1 mg Oral Q6H PRN [name], MD      . haloperidol (HALDOL) tablet 5 mg  5 mg Oral BID [name], MD       Or  . haloperidol lactate (HALDOL) injection 5 mg  5 mg Intramuscular BID [name], MD   5 mg at 10/04/20 1218  . magnesium hydroxide (MILK OF MAGNESIA) suspension 30 mL  30 mL Oral Daily PRN Ival Bible, MD      . traZODone (DESYREL) tablet 50 mg  50 mg Oral QHS PRN Ival Bible, MD       No current outpatient medications on file.    PTA Medications: (Not in a hospital admission)   Musculoskeletal  Strength & Muscle Tone: within normal limits Gait & Station: normal Patient leans: Right  Psychiatric Specialty Exam  Presentation  General Appearance: Disheveled  Eye Contact:Poor  Speech:Clear and Coherent  Speech  Volume:Normal  Handedness:Right   Mood and Affect  Mood:Irritable  Affect:Constricted   Thought Process  Thought Processes:Other (comment) (illogical)  Descriptions of Associations:Loose  Orientation:Partial  Thought Content:Paranoid Ideation; Illogical; Delusions  Diagnosis of Schizophrenia or Schizoaffective disorder in past: Yes  Duration of Psychotic Symptoms: Greater than six months   Hallucinations:Hallucinations: Auditory Description of Auditory Hallucinations: Patient denies, but has been observed responding to internal stimuli.  Ideas of Reference:Paranoia; Delusions; Percusatory  Suicidal Thoughts:Suicidal Thoughts: No  Homicidal Thoughts:Homicidal Thoughts: No   Sensorium  Memory:Remote Poor; Recent Poor; Immediate Poor  Judgment:Impaired  Insight:Lacking   Executive Functions  Concentration:Fair  Attention Span:Fair  Recall:Poor  Fund of Knowledge:Poor  Language:Good   Psychomotor Activity  Psychomotor Activity:Psychomotor Activity: Normal   Assets  Assets:Communication Skills; Financial Resources/Insurance; Housing; Resilience; Social Support   Sleep  Sleep:Sleep: Fair   Nutritional Assessment (For OBS and FBC admissions only) Has the patient had a weight loss or gain of 10 pounds or more in the last 3 months?: Yes Has the patient had a decrease in food intake/or appetite?: Yes Does the patient have dental problems?: No Does the patient have eating habits or behaviors that may be indicators of an eating disorder including binging or inducing vomiting?: No Has the patient recently lost weight without trying?: Patient is unsure Has the patient been eating poorly because of a decreased appetite?: No Malnutrition Screening Tool Score: 2    Physical Exam  Physical Exam ROS There were no vitals taken for this visit. There is no height or weight on file to calculate BMI.     Disposition: Transfer to [facility],  NP 10/05/2020, 1:02 AM
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
## 2020-10-05 NOTE — Progress Notes (Signed)
   10/05/20 2230  COVID-19 Daily Checkoff  Have you had a fever (temp > 37.80C/100F)  in the past 24 hours?  No  If you have had runny nose, nasal congestion, sneezing in the past 24 hours, has it worsened? No  COVID-19 EXPOSURE  Have you traveled outside the state in the past 14 days? No  Have you been in contact with someone with a confirmed diagnosis of COVID-19 or PUI in the past 14 days without wearing appropriate PPE? No  Have you been living in the same home as a person with confirmed diagnosis of COVID-19 or a PUI (household contact)? No  Have you been diagnosed with COVID-19? No

## 2020-10-05 NOTE — Progress Notes (Signed)
Pt did accept her Boost, was observed drinking it.  10/05/20 2328  Psych Admission Type (Psych Patients Only)  Admission Status Involuntary  Psychosocial Assessment  Patient Complaints Irritability  Eye Contact Brief  Facial Expression Pinched  Affect Flat  Speech Soft  Interaction Minimal  Motor Activity Slow  Appearance/Hygiene Disheveled  Behavior Characteristics Unwilling to participate;Guarded  Mood Depressed;Irritable  Thought Process  Coherency Circumstantial  Content Paranoia  Delusions Paranoid  Perception WDL  Hallucination None reported or observed  Judgment Impaired  Confusion None  Danger to Self  Current suicidal ideation? Denies  Danger to Others  Danger to Others None reported or observed

## 2020-10-05 NOTE — Tx Team (Signed)
Initial Treatment Plan 10/05/2020 4:33 AM [name] ZOX:[id number]    PATIENT STRESSORS: Marital or family conflict Medication change or noncompliance   PATIENT STRENGTHS: General fund of knowledge Motivation for treatment/growth   PATIENT IDENTIFIED PROBLEMS: psychosis  "nothing"                   DISCHARGE CRITERIA:  Improved stabilization in mood, thinking, and/or behavior Verbal commitment to aftercare and medication compliance  PRELIMINARY DISCHARGE PLAN: Attend aftercare/continuing care group Outpatient therapy  PATIENT/FAMILY INVOLVEMENT: This treatment plan has been presented to and reviewed with the patient, [name].  The patient and family have been given the opportunity to ask questions and make suggestions.  Delos Haring, RN 10/05/2020, 4:33 AM

## 2020-10-05 NOTE — ED Notes (Signed)
Report called to [facility] RN @[facility]  adult unit
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
## 2020-10-05 NOTE — Progress Notes (Signed)
   10/05/20 0700  Vital Signs  Temp 97.9 F (36.6 C)  Temp Source Oral  Pulse Rate 80  Pulse Rate Source Dinamap  Resp 16  BP 117/68  BP Location Right Arm  BP Method Automatic  Patient Position (if appropriate) Sitting   D: Patient denies SI/HI/AVH. Pt. Denies anxiety and depression. Pt . Reported that she is here because of the [facility]. Pt. Isolated in her room and refused ECG. Pt. Stated "there ain't  Nothing wrong with my heart." Pt refused Ensure saying "I don't drink those." Pt did request  Gatorade. A:  Patient took scheduled medicine.  Support and encouragement provided Routine safety checks conducted every 15 minutes. Patient  Informed to notify staff with any concerns.  R: Safety maintained.

## 2020-10-05 NOTE — ED Notes (Signed)
Called report to adult unit@[facility]  but nurse need to call me back due to nurse not available@this  time

## 2020-10-06 DIAGNOSIS — F200 Paranoid schizophrenia: Secondary | ICD-10-CM | POA: Diagnosis not present

## 2020-10-06 LAB — COMPREHENSIVE METABOLIC PANEL
ALT: 13 U/L (ref 0–44)
AST: 23 U/L (ref 15–41)
Albumin: 4.5 g/dL (ref 3.5–5.0)
Alkaline Phosphatase: 68 U/L (ref 38–126)
Anion gap: 14 (ref 5–15)
BUN: 20 mg/dL (ref 6–20)
CO2: 19 mmol/L — ABNORMAL LOW (ref 22–32)
Calcium: 9.2 mg/dL (ref 8.9–10.3)
Chloride: 103 mmol/L (ref 98–111)
Creatinine, Ser: 1.19 mg/dL — ABNORMAL HIGH (ref 0.44–1.00)
GFR, Estimated: 53 mL/min — ABNORMAL LOW (ref >60)
Glucose, Bld: 164 mg/dL — ABNORMAL HIGH (ref 70–99)
Potassium: 3.7 mmol/L (ref 3.5–5.1)
Sodium: 136 mmol/L (ref 135–145)
Total Bilirubin: 0.7 mg/dL (ref 0.3–1.2)
Total Protein: 8.3 g/dL — ABNORMAL HIGH (ref 6.5–8.1)

## 2020-10-06 MED ORDER — HALOPERIDOL 5 MG PO TABS: 5.0000 mg | ORAL_TABLET | Freq: Four times a day (QID) | ORAL | Status: DC | PRN

## 2020-10-06 MED ORDER — DIPHENHYDRAMINE HCL 25 MG PO CAPS: 25.0000 mg | ORAL_CAPSULE | Freq: Four times a day (QID) | ORAL | Status: DC | PRN

## 2020-10-06 MED ORDER — LORAZEPAM 1 MG PO TABS: 2.0000 mg | ORAL_TABLET | Freq: Four times a day (QID) | ORAL | Status: DC | PRN

## 2020-10-06 MED ORDER — HALOPERIDOL LACTATE 5 MG/ML IJ SOLN: 5.0000 mg | Freq: Four times a day (QID) | INTRAMUSCULAR | Status: DC | PRN

## 2020-10-06 MED ORDER — MEGESTROL ACETATE 20 MG PO TABS
20.0000 mg | ORAL_TABLET | Freq: Two times a day (BID) | ORAL | Status: DC
  Administered 2020-10-06 – 2020-10-09 (×7): 20 mg via ORAL
  Filled 2020-10-06 (×9): qty 1

## 2020-10-06 MED ORDER — LORAZEPAM 2 MG/ML IJ SOLN: 2.0000 mg | Freq: Four times a day (QID) | INTRAMUSCULAR | Status: DC | PRN

## 2020-10-06 MED ORDER — DIPHENHYDRAMINE HCL 50 MG/ML IJ SOLN: 25.0000 mg | Freq: Four times a day (QID) | INTRAMUSCULAR | Status: DC | PRN

## 2020-10-06 NOTE — Progress Notes (Signed)
On approach, the pt is irritable and presents with a flat affect and depressed mood. When asked if she's SI/HI or experiencing AVH, she stated "that's not what I am here for." During safety checks she's noted to be sitting on the bench in her room throughout the day. She is withdrawn and isolative to her room. Patient hypotensive this am. Dr. Mason Jim made aware of pt b/p. Fluids encouraged and provided to the pt. B/p reassessed and WNL. Patient is agreeable to drinking gatorade and Breeze supplemental drinks. She refuses to eat meals and stated that she doesn't eat the food here.   Orders reviewed. Vital signs reviewed and assessed q four hours per MD. Fluids offered. Meals offered. Urine specimen cup provided to the pt. Patient encouraged to attend groups. Verbal support provided. 15 minute checks performed for safety.

## 2020-10-06 NOTE — Progress Notes (Addendum)
[facility] MD Progress Note  10/06/2020 7:47 AM [name]  MRN:  [id number]    Chief Complaint: psychosis  Subjective:  [name] is a 60 y.o. female with a history of schizophrenia followed by ACTT, who was initially admitted for inpatient psychiatric hospitalization on 10/05/2020 for management of worsening psychosis in the context of medication noncompliance. The patient is currently on Hospital Day 1.   Chart Review from last 24 hours:  The patient's chart was reviewed and nursing notes were reviewed. The patient's case was discussed in multidisciplinary team meeting. Per nursing, the patient was guarded, irritable, and isolative to her room on day shift. She did drink some Boost per nursing but is not eating meals.  Per MAR she was compliant with scheduled medications except refusal of Ensure and did not receive Megace since it was not available from pharmacy. She did take Boost 4 times yesterday. She did not require PRN medications.  Information Obtained Today During Patient Interview: The patient was seen and evaluated on the unit. On assessment today the patient is easily agitated with questioning and is minimally cooperative for an assessment. She is drinking Gatorade during exam and was encouraged to drink and eat and attend to her ADLs. She states she does not want food here because "it is not for her digestive system." She admits to [facility] of the government "talking through a network." She will not give additional details and will not state if AH are command in nature. When questioned if she is having VH or ideas of reference she states "why should I." She will not comment when questioned about first rank symptoms or magical thinking and states she is irritated with questions. She denies SI and when questioned about HI states "I have a temper." I advised that I talked to her sister who is her guardian and she becomes agitated stating "I do not have a sister." She denies N/V/D, CP or SOB but would not  cooperate for additional ROS questions. She does know it is March and that she is in [place] but states "the year is wrong" when asked what year it is.  Call to Guardian: I spoke with her guardian/sister on the phone  ([name]). Per her guardian, the patient had not been receiving medications with ACTT prior to admission and had not been receiving monthly injections - her ACTT has recently switched and she feels the patient does not have rapport with the new team; I discussed the patient's Haldol dec and po Haldol protocol as well as her Remeron and Megace. Her guardian has no issues with her current medications and agrees to the team doing forced medications or forced IV fluids if the patient requires them. I advised that we are monitoring her hydration status and blood pressure today and that currently the patient is taking her po Haldol. I advised that she is currently refusing an EKG but we will continue to attempt to obtain this. Time was given for questions.  Principal Problem: Paranoid schizophrenia (HCC) Diagnosis: Principal Problem:   Paranoid schizophrenia (HCC)  Total Time Spent in Direct Patient Care:  I personally spent 30 minutes on the unit in direct patient care. The direct patient care time included face-to-face time with the patient, reviewing the patient's chart, communicating with other professionals, and coordinating care. Greater than 50% of this time was spent in counseling or coordinating care with the patient regarding goals of hospitalization, psycho-education, and discharge planning needs.  Past Psychiatric History: see admission H&P  Past Medical History:  Past Medical History:  Diagnosis Date  . Non compliance w medication regimen   . Schizophrenia (HCC)    Family History: see admission H&P  Family Psychiatric  History: see admission H&P  Social History:  Social History   Substance and Sexual Activity  Alcohol Use Not Currently   Comment:  Refuses to disclose how much     Social History   Substance and Sexual Activity  Drug Use No   Comment: Refuses to answer    Social History   Socioeconomic History  . Marital status: Single    Spouse name: Not on file  . Number of children: Not on file  . Years of education: Not on file  . Highest education level: Not on file  Occupational History  . Not on file  Tobacco Use  . Smoking status: Current Some Day Smoker    Packs/day: 0.50    Types: Cigarettes  . Smokeless tobacco: Never Used  Vaping Use  . Vaping Use: Every day  Substance and Sexual Activity  . Alcohol use: Not Currently    Comment: Refuses to disclose how much  . Drug use: No    Comment: Refuses to answer  . Sexual activity: Never    Comment: refused to answer  Other Topics Concern  . Not on file  Social History Narrative  . Not on file   Social Determinants of Health   Financial Resource Strain: Not on file  Food Insecurity: Not on file  Transportation Needs: Not on file  Physical Activity: Not on file  Stress: Not on file  Social Connections: Not on file   Sleep: Good  Appetite:  Poor but drinking Gatorade and Boost  Current Medications: Current Facility-Administered Medications  Medication Dose Route Frequency Provider Last Rate Last Admin  . acetaminophen (TYLENOL) tablet 650 mg  650 mg Oral Q6H PRN Jaclyn Shaggyaylor, Cody W, PA-C      . alum & mag hydroxide-simeth (MAALOX/MYLANTA) 200-200-20 MG/5ML suspension 30 mL  30 mL Oral Q4H PRN Melbourne Abtsaylor, Cody W, PA-C      . benztropine (COGENTIN) tablet 0.5 mg  0.5 mg Oral BID Mason JimSingleton, Oakes Mccready E, MD   0.5 mg at 10/05/20 1654  . feeding supplement (BOOST / RESOURCE BREEZE) liquid 1 Container  1 Container Oral TID BM Oneta RackLewis, Tanika N, NP   1 Container at 10/05/20 1949  . feeding supplement (ENSURE ENLIVE / ENSURE PLUS) liquid 237 mL  237 mL Oral BID BM Melbourne Abtsaylor, Cody W, PA-C      . haloperidol (HALDOL) tablet 5 mg  5 mg Oral BID Oneta RackLewis, Tanika N, NP   5 mg at 10/05/20  1654  . magnesium hydroxide (MILK OF MAGNESIA) suspension 30 mL  30 mL Oral Daily PRN Melbourne Abtsaylor, Cody W, PA-C      . megestrol (MEGACE) tablet 20 mg  20 mg Oral Daily Oneta RackLewis, Tanika N, NP      . mirtazapine (REMERON) tablet 7.5 mg  7.5 mg Oral QHS Oneta RackLewis, Tanika N, NP   7.5 mg at 10/05/20 1951  . traZODone (DESYREL) tablet 50 mg  50 mg Oral QHS PRN Jaclyn Shaggyaylor, Cody W, PA-C        Lab Results:  Results for orders placed or performed during the hospital encounter of 10/05/20 (from the past 48 hour(s))  Pregnancy, urine     Status: None   Collection Time: 10/05/20  4:35 AM  Result Value Ref Range   Preg Test, Ur NEGATIVE NEGATIVE  Comment: Performed at [facility], [address] [place], [place] [address]  Urine rapid drug screen (hosp performed)not at [facility]     Status: None   Collection Time: 10/05/20  4:35 AM  Result Value Ref Range   Opiates NONE DETECTED NONE DETECTED   Cocaine NONE DETECTED NONE DETECTED   Benzodiazepines NONE DETECTED NONE DETECTED   Amphetamines NONE DETECTED NONE DETECTED   Tetrahydrocannabinol NONE DETECTED NONE DETECTED   Barbiturates NONE DETECTED NONE DETECTED    Comment: (NOTE) DRUG SCREEN FOR MEDICAL PURPOSES ONLY.  IF CONFIRMATION IS NEEDED FOR ANY PURPOSE, NOTIFY LAB WITHIN 5 DAYS.  LOWEST DETECTABLE LIMITS FOR URINE DRUG SCREEN Drug Class                     Cutoff (ng/mL) Amphetamine and metabolites    1000 Barbiturate and metabolites    200 Benzodiazepine                 200 Tricyclics and metabolites     300 Opiates and metabolites        300 Cocaine and metabolites        300 THC                            50 Performed at [facility], [address], [place], [place] [address]     Blood Alcohol level:  Lab Results  Component Value Date   ETH <10 10/01/2020   ETH <10 02/29/2020    Metabolic Disorder Labs: Lab Results  Component Value Date   HGBA1C 5.7 (H) 10/01/2020   MPG 116.89 10/01/2020    MPG 116.89 03/14/2020   Lab Results  Component Value Date   PROLACTIN 13.6 04/16/2015   Lab Results  Component Value Date   CHOL 171 10/01/2020   TRIG 68 10/01/2020   HDL 64 10/01/2020   CHOLHDL 2.7 10/01/2020   VLDL 14 10/01/2020   LDLCALC 93 10/01/2020   LDLCALC 118 (H) 04/16/2015    Physical Findings: AIMS: Facial and Oral Movements Muscles of Facial Expression: None, normal Lips and Perioral Area: None, normal Jaw: None, normal Tongue: None, normal,Extremity Movements Upper (arms, wrists, hands, fingers): None, normal Lower (legs, knees, ankles, toes): None, normal, Trunk Movements Neck, shoulders, hips: None, normal, Overall Severity Severity of abnormal movements (highest score from questions above): None, normal Incapacitation due to abnormal movements: None, normal Patient's awareness of abnormal movements (rate only patient's report): No Awareness, Dental Status Current problems with teeth and/or dentures?: No Does patient usually wear dentures?: No   Musculoskeletal: Strength & Muscle Tone: UTA - patient uncooperative for testing Gait & Station: normal, steady Patient leans: [name]  Psychiatric Specialty Exam: Physical Exam Vitals reviewed.  HENT:     Head: Normocephalic.  Pulmonary:     Effort: Pulmonary effort is normal.  Neurological:     Mental Status: She is alert.     Review of Systems - denies CP, SOB, N/V/D  Blood pressure 95/71, pulse 86, temperature 98.4 F (36.9 C), temperature source Oral, resp. rate 18, height 5\' 4"  (1.626 m), weight 49.9 kg, SpO2 95 %.Body mass index is 18.88 kg/m.  General Appearance: thin, appears older than stated age, minimally cooperative for exam  Eye Contact:  Fair  Speech:  Clear and Coherent and Normal Rate  Volume:  Increased  Mood:  Angry and Irritable  Affect:  Congruent  Thought Process:  Disorganized  Orientation:  Oriented to self, month and city not year  Thought Content:  Illogical and endorses AH of  the government talking to her through a network and appears paranoid on exam; has delusional thinking and paranoia about food on the unit; will not answer questions about ideas of referenece or first rank symptoms  Suicidal Thoughts:  Denied  Homicidal Thoughts:  UTA - will not answer directly - states "I have a temper"  Memory:  Recent;   Poor  Judgement:  Impaired  Insight:  Lacking  Psychomotor Activity:  Increased - paces in room  Concentration:  Concentration: Poor and Attention Span: Poor  Recall:  Poor  Fund of Knowledge:  Limted secondary to psychosis   Language:  Good  Akathisia:  Negative  AIMS (if indicated):   would not cooperate for testing today  Assets:  Desire for Improvement Housing Resilience Social Support Others:  guardianship  ADL's:  Intact  Cognition:  Impaired,  Mild secondary to psychosis  Sleep:  Number of Hours: 9.75    Treatment Plan Summary: Diagnoses / Active Problems: Schizophrenia by hx Hypotension/poor po intake  PLAN: 1. Safety and Monitoring:  -- Involuntary admission to inpatient psychiatric unit for safety, stabilization and treatment (patient has guardian)  -- Daily contact with patient to assess and evaluate symptoms and progress in treatment  -- Patient's case to be discussed in multi-disciplinary team meeting  -- Observation Level : q15 minute checks  -- Vital signs:  q12 hours  -- Precautions: suicide, elopement, and assault  2. Psychiatric Diagnoses and Treatment:  Schizophrenia by hx -- Patient has guardian and I spoke this morning with guardian, Paula Mount-Lennon. I discussed the patient's Haldol dec and po Haldol protocol as well as her Remeron and Megace. Her guardian has no issues with her current medications and agrees to the team doing forced medications or forced IV fluids if the patient requires them. I advised that we are monitoring her hydration status and blood pressure today and that currently the patient is taking her  po Haldol. I advised that she is currently refusing an EKG but we will continue to attempt to obtain this. Time was given for questions. -- Patient's level of paranoia is currently preventing her from adequate po intake - we are encouraging po fluids and Boost and will recheck vitals q4 hours. We are monitoring her creatinine when she will cooperate for labs. I will hold Remeron since this medication can contribute to orthostatic BP drops given her low BP today. If she continues to have hypotension or worsening creatinine she will need to have IVF in the ED. Guardian aware and in agreement.  -- Metabolic profile and EKG monitoring obtained while on an atypical antipsychotic (BMI: Lipid Panel:cholesterol 171, triglycerides 68, HDL 64, LDL 93;  HbgA1c:5.7; NWG:NFAOZHY and encouraging patient to allow EKG)  -- Continue Haldol 5mg  po bid as bridging medication while Haldol dec becomes therapeutic. Received Haldol dec 100mg  on 10/02/20 and Haldol dec 100mg  on 10/04/20.  -- Has PRN Haldol, Ativan, and Benadryl IM and po for agitation protocol  -- Continue Cogentin 0.5mg  bid   -- Encouraged patient to participate in unit milieu and in scheduled group therapies   -- Short Term Goals: Ability to identify changes in lifestyle to reduce recurrence of condition will improve, Ability to demonstrate self-control will improve and Ability to identify and develop effective coping behaviors will improve  -- Long Term Goals: Improvement in symptoms so as ready for discharge   3.  Medical Issues Being Addressed:   Hypotension and Creatinine 1.24 on admission with anorexia  -- Encouraging po hydration and Boost  -- Repeat CMP shows: creatinine 1.19 trending down from 1.24 with BUN 19  -- Checking q4 hour vitals while awake for BP and HR - may need IVF if clinically worsens  -- Hold Remeron to avoid further hypotension at this time   -- Increase Megace to  bid   Admission Labs:  UPT negative; UDS negative  4.  Discharge Planning:   -- Social work and case management to assist with discharge planning and identification of hospital follow-up needs prior to discharge  -- Estimated LOS: 10-14 days  -- Discharge Concerns: Need to establish a safety plan; Medication compliance and effectiveness  -- Discharge Goals: Return home with outpatient referrals for mental health follow-up including medication management/psychotherapy  Comer Locket, MD, FAPA 10/06/2020, 7:47 AM

## 2020-10-06 NOTE — [facility] Group Notes (Signed)
Adult Psychoeducational Group Not Date:  10/06/2020 Time:  0900-1045 Group Topic/Focus: PROGRESSIVE RELAXATION. A group where deep breathing is taught and tensing and relaxation muscle groups is used. Imagery is used as well.  Pts are asked to imagine 3 pillars that hold them up when they are not able to hold themselves up.  Participation Level:  Did not attend  [name] A    

## 2020-10-06 NOTE — Progress Notes (Signed)
   10/06/20 0500  Sleep  Number of Hours 9.75

## 2020-10-06 NOTE — Progress Notes (Signed)
Patient is isolative to her room Writer entered her room to observe her lying on her bed resting. She reported having had a good day. She c/o back pain from sitting on her bench in her room today. She was offered a heat pack or tylenol but declined. She did drink her boost and vitals taken. She was calm and pleasant. Safety maintained on unit with 15 min checks.

## 2020-10-06 NOTE — Progress Notes (Signed)
Adult Psychoeducational Group Note  Date:  10/06/2020 Time:  11:46 PM  Group Topic/Focus:  Wrap-Up Group:   The focus of this group is to help patients review their daily goal of treatment and discuss progress on daily workbooks.  Participation Level:  Did Not Attend  Participation Quality:  Did Not Attend  Affect:  Did Not Attend  Cognitive:  Did Not Attend  Insight: None  Engagement in Group:  Did Not Attend  Modes of Intervention:  Did Not Attend  Additional Comments:  Pt did not attend evening wrap up group tonight.  Felipa Furnace 10/06/2020, 11:46 PM

## 2020-10-06 NOTE — Progress Notes (Signed)
Patient agreed to a urine specimen sample. Pt was provided with a specimen cup. Education provided.

## 2020-10-07 DIAGNOSIS — F200 Paranoid schizophrenia: Secondary | ICD-10-CM | POA: Diagnosis not present

## 2020-10-07 NOTE — Tx Team (Signed)
Interdisciplinary Treatment and Diagnostic Plan Update  10/07/2020 Time of Session: 9:50am [name] MRN: [id number]  Principal Diagnosis: Paranoid schizophrenia [facility])  Secondary Diagnoses: Principal Problem:   Paranoid schizophrenia ([place])   Current Medications:  Current Facility-Administered Medications  Medication Dose Route Frequency Provider Last Rate Last Admin  . acetaminophen (TYLENOL) tablet 650 mg  650 mg Oral Q6H PRN Prescilla Sours, PA-C      . alum & mag hydroxide-simeth (MAALOX/MYLANTA) 200-200-20 MG/5ML suspension 30 mL  30 mL Oral Q4H PRN Margorie John W, PA-C      . benztropine (COGENTIN) tablet 0.5 mg  0.5 mg Oral BID Viann Fish E, MD   0.5 mg at 10/07/20 6389  . diphenhydrAMINE (BENADRYL) capsule 25 mg  25 mg Oral Q6H PRN Nelda Marseille, Amy E, MD       Or  . diphenhydrAMINE (BENADRYL) injection 25 mg  25 mg Intramuscular Q6H PRN Nelda Marseille, Amy E, MD      . feeding supplement (BOOST / RESOURCE BREEZE) liquid 1 Container  1 Container Oral TID BM Derrill Center, NP   1 Container at 10/07/20 1041  . feeding supplement (ENSURE ENLIVE / ENSURE PLUS) liquid 237 mL  237 mL Oral BID BM Margorie John W, PA-C   237 mL at 10/06/20 1657  . haloperidol (HALDOL) tablet 5 mg  5 mg Oral BID Derrill Center, NP   5 mg at 10/07/20 3734  . haloperidol (HALDOL) tablet 5 mg  5 mg Oral Q6H PRN Harlow Asa, MD       Or  . haloperidol lactate (HALDOL) injection 5 mg  5 mg Intramuscular Q6H PRN Nelda Marseille, Amy E, MD      . LORazepam (ATIVAN) tablet 2 mg  2 mg Oral Q6H PRN Harlow Asa, MD       Or  . LORazepam (ATIVAN) injection 2 mg  2 mg Intramuscular Q6H PRN Nelda Marseille, Amy E, MD      . magnesium hydroxide (MILK OF MAGNESIA) suspension 30 mL  30 mL Oral Daily PRN Margorie John W, PA-C      . megestrol (MEGACE) tablet 20 mg  20 mg Oral BID Harlow Asa, MD   20 mg at 10/07/20 2876  . traZODone (DESYREL) tablet 50 mg  50 mg Oral QHS PRN Prescilla Sours, PA-C       PTA  Medications: No medications prior to admission.    Patient Stressors: Marital or family conflict Medication change or noncompliance  Patient Strengths: Technical sales engineer for treatment/growth  Treatment Modalities: Medication Management, Group therapy, Case management,  1 to 1 session with clinician, Psychoeducation, Recreational therapy.   Physician Treatment Plan for Primary Diagnosis: Paranoid schizophrenia ([place]) Long Term Goal(s): Improvement in symptoms so as ready for discharge   Short Term Goals: Ability to identify changes in lifestyle to reduce recurrence of condition will improve Ability to demonstrate self-control will improve Ability to identify and develop effective coping behaviors will improve  Medication Management: Evaluate patient's response, side effects, and tolerance of medication regimen.  Therapeutic Interventions: 1 to 1 sessions, Unit Group sessions and Medication administration.  Evaluation of Outcomes: Not Met  Physician Treatment Plan for Secondary Diagnosis: Principal Problem:   Paranoid schizophrenia ([place])  Long Term Goal(s): Improvement in symptoms so as ready for discharge   Short Term Goals: Ability to identify changes in lifestyle to reduce recurrence of condition will improve Ability to demonstrate self-control will improve Ability to identify and develop effective  coping behaviors will improve     Medication Management: Evaluate patient's response, side effects, and tolerance of medication regimen.  Therapeutic Interventions: 1 to 1 sessions, Unit Group sessions and Medication administration.  Evaluation of Outcomes: Not Met   RN Treatment Plan for Primary Diagnosis: Paranoid schizophrenia ([place]) Long Term Goal(s): Knowledge of disease and therapeutic regimen to maintain health will improve  Short Term Goals: Ability to remain free from injury will improve, Ability to verbalize frustration and anger appropriately will  improve, Ability to identify and develop effective coping behaviors will improve and Compliance with prescribed medications will improve  Medication Management: RN will administer medications as ordered by provider, will assess and evaluate patient's response and provide education to patient for prescribed medication. RN will report any adverse and/or side effects to prescribing provider.  Therapeutic Interventions: 1 on 1 counseling sessions, Psychoeducation, Medication administration, Evaluate responses to treatment, Monitor vital signs and CBGs as ordered, Perform/monitor CIWA, COWS, AIMS and Fall Risk screenings as ordered, Perform wound care treatments as ordered.  Evaluation of Outcomes: Not Met   LCSW Treatment Plan for Primary Diagnosis: Paranoid schizophrenia ([place]) Long Term Goal(s): Safe transition to appropriate next level of care at discharge, Engage patient in therapeutic group addressing interpersonal concerns.  Short Term Goals: Engage patient in aftercare planning with referrals and resources, Increase social support, Identify triggers associated with mental health/substance abuse issues and Increase skills for wellness and recovery  Therapeutic Interventions: Assess for all discharge needs, 1 to 1 time with Social worker, Explore available resources and support systems, Assess for adequacy in community support network, Educate family and significant other(s) on suicide prevention, Complete Psychosocial Assessment, Interpersonal group therapy.  Evaluation of Outcomes: Not Met   Progress in Treatment: Attending groups: No. Participating in groups: No. Taking medication as prescribed: Yes. Toleration medication: Yes. Family/Significant other contact made: Yes, individual(s) contacted:  sister/guardian  Patient understands diagnosis: No. Discussing patient identified problems/goals with staff: No. Medical problems stabilized or resolved: No. Denies suicidal/homicidal  ideation: Yes. Issues/concerns per patient self-inventory: No.  New problem(s) identified: No, Describe:  none  New Short Term/Long Term Goal(s): medication stabilization, elimination of SI thoughts, development of comprehensive mental wellness plan.    Patient Goals:  Did not attend  Discharge Plan or Barriers: Patient is to return home to live with her mother. Patient is to continue services with current ACTT provider  Reason for Continuation of Hospitalization: Depression Hallucinations Medical Issues Medication stabilization  Estimated Length of Stay: 3-5 days  Attendees: Patient: Did not attend 10/07/2020 2:04 PM  Physician:  10/07/2020 2:04 PM  Nursing:  10/07/2020 2:04 PM  RN Care Manager: 10/07/2020 2:04 PM  Social Worker: Darletta Moll, LCSW 10/07/2020 2:04 PM  Recreational Therapist:  10/07/2020 2:04 PM  Other:  10/07/2020 2:04 PM  Other:  10/07/2020 2:04 PM  Other: 10/07/2020 2:04 PM    Scribe for Treatment Team: Vassie Moselle, LCSW 10/07/2020 2:04 PM

## 2020-10-07 NOTE — Progress Notes (Addendum)
[facility]  10/07/2020 1:58 PM [name]  MRN:  [id number]    Chief Complaint: psychosis  Subjective:  [name] is a 60 y.o. female with a history of schizophrenia followed by ACTT, who was initially admitted for inpatient psychiatric hospitalization on 10/05/2020 for management of worsening psychosis in the context of medication noncompliance. The patient is currently on Hospital Day 2.   Chart Review from last 24 hours:  The patient's chart was reviewed and nursing notes were reviewed. The patient's case was discussed in multidisciplinary team meeting. Per nursing, she was isolative to her room and did not attend groups. She appears withdrawn and irritable. She has been taking po fluids but refuses to eat meals. Per MAR, she was compliant with scheduled medications and did take Boost yesterday as well as 1 Ensure. She required no PRN medications.   Information Obtained Today During Patient Interview: The patient was seen and evaluated on the unit. She remains irritable and guarded with questioning today and is minimally cooperative for an exam. When asked how her mood is she states "fine." She perseverates on desire for discharge. She states her sleep "was alright" but she continues to refuse to eat stating she "cannot process the food here." She reports that she has been drinking fluids and denies N/V, dizziness, CP or SOB. She denies SI or HI. When questioned about AVH she becomes upset, starts pacing, and becomes loud. She states "you need to consult the Government and state legislature because they are wide-open talking. You gotta mend for it if you want telepathy or to hear it." She goes on to describe her belief that a government network is "piping in" AH on the inpatient unit for her to hear. She will not answer when questioned about ideas of reference or first rank symptoms. When asked orientation questions she states the President "is [name] and it was supposed to be  [name] but he is over there." She knows it is March but when asked the day of the week or year states "it is the wrong calendar day."   Principal Problem: Paranoid schizophrenia (HCC) Diagnosis: Principal Problem:   Paranoid schizophrenia (HCC)  Total Time Spent in Direct Patient Care:  I personally spent 30 minutes on the unit in direct patient care. The direct patient care time included face-to-face time with the patient, reviewing the patient's chart, communicating with other professionals, and coordinating care. Greater than 50% of this time was spent in counseling or coordinating care with the patient regarding goals of hospitalization, psycho-education, and discharge planning needs.  Past Psychiatric History: see admission H&P  Past Medical History:  Past Medical History:  Diagnosis Date  . Non compliance w medication regimen   . Schizophrenia (HCC)    Family History: see admission H&P  Family Psychiatric  History: see admission H&P  Social History:  Social History   Substance and Sexual Activity  Alcohol Use Not Currently   Comment: Refuses to disclose how much     Social History   Substance and Sexual Activity  Drug Use No   Comment: Refuses to answer    Social History   Socioeconomic History  . Marital status: Single    Spouse name: Not on file  . Number of children: Not on file  . Years of education: Not on file  . Highest education level: Not on file  Occupational History  . Not on file  Tobacco Use  . Smoking status: Current Some Day Smoker  Packs/day: 0.50    Types: Cigarettes  . Smokeless tobacco: Never Used  Vaping Use  . Vaping Use: Every day  Substance and Sexual Activity  . Alcohol use: Not Currently    Comment: Refuses to disclose how much  . Drug use: No    Comment: Refuses to answer  . Sexual activity: Never    Comment: refused to answer  Other Topics Concern  . Not on file  Social History Narrative  . Not on file   Social  Determinants of Health   Financial Resource Strain: Not on file  Food Insecurity: Not on file  Transportation Needs: Not on file  Physical Activity: Not on file  Stress: Not on file  Social Connections: Not on file   Sleep: Good  Appetite:  Poor but drinking Gatorade and Boost  Current Medications: Current Facility-Administered Medications  Medication Dose Route Frequency Provider Last Rate Last Admin  . acetaminophen (TYLENOL) tablet 650 mg  650 mg Oral Q6H PRN Jaclyn Shaggy, PA-C      . alum & mag hydroxide-simeth (MAALOX/MYLANTA) 200-200-20 MG/5ML suspension 30 mL  30 mL Oral Q4H PRN Melbourne Abts W, PA-C      . benztropine (COGENTIN) tablet 0.5 mg  0.5 mg Oral BID Bartholomew Crews E, MD   0.5 mg at 10/07/20 0109  . diphenhydrAMINE (BENADRYL) capsule 25 mg  25 mg Oral Q6H PRN Mason Jim, Trevonne Nyland E, MD       Or  . diphenhydrAMINE (BENADRYL) injection 25 mg  25 mg Intramuscular Q6H PRN Mason Jim, Khalilah Hoke E, MD      . feeding supplement (BOOST / RESOURCE BREEZE) liquid 1 Container  1 Container Oral TID BM Oneta Rack, NP   1 Container at 10/07/20 1041  . feeding supplement (ENSURE ENLIVE / ENSURE PLUS) liquid 237 mL  237 mL Oral BID BM Melbourne Abts W, PA-C   237 mL at 10/06/20 1657  . haloperidol (HALDOL) tablet 5 mg  5 mg Oral BID Oneta Rack, NP   5 mg at 10/07/20 3235  . haloperidol (HALDOL) tablet 5 mg  5 mg Oral Q6H PRN Comer Locket, MD       Or  . haloperidol lactate (HALDOL) injection 5 mg  5 mg Intramuscular Q6H PRN Mason Jim, Terry Abila E, MD      . LORazepam (ATIVAN) tablet 2 mg  2 mg Oral Q6H PRN Comer Locket, MD       Or  . LORazepam (ATIVAN) injection 2 mg  2 mg Intramuscular Q6H PRN Mason Jim, Najiyah Paris E, MD      . magnesium hydroxide (MILK OF MAGNESIA) suspension 30 mL  30 mL Oral Daily PRN Melbourne Abts W, PA-C      . megestrol (MEGACE) tablet 20 mg  20 mg Oral BID Comer Locket, MD   20 mg at 10/07/20 5732  . traZODone (DESYREL) tablet 50 mg  50 mg Oral QHS PRN Jaclyn Shaggy, PA-C        Lab Results:  Results for orders placed or performed during the hospital encounter of 10/05/20 (from the past 48 hour(s))  Comprehensive metabolic panel     Status: Abnormal   Collection Time: 10/06/20  6:46 AM  Result Value Ref Range   Sodium 136 135 - 145 mmol/L   Potassium 3.7 3.5 - 5.1 mmol/L   Chloride 103 98 - 111 mmol/L   CO2 19 (L) 22 - 32 mmol/L   Glucose, Bld 164 (H) 70 - 99  mg/dL    Comment: Glucose reference range applies only to samples taken after fasting for at least 8 hours.   BUN 20 6 - 20 mg/dL   Creatinine, Ser 9.32 (H) 0.44 - 1.00 mg/dL   Calcium 9.2 8.9 - 35.5 mg/dL   Total Protein 8.3 (H) 6.5 - 8.1 g/dL   Albumin 4.5 3.5 - 5.0 g/dL   AST 23 15 - 41 U/L   ALT 13 0 - 44 U/L   Alkaline Phosphatase 68 38 - 126 U/L   Total Bilirubin 0.7 0.3 - 1.2 mg/dL   GFR, Estimated 53 (L) >60 mL/min    Comment: (NOTE) Calculated using the CKD-EPI Creatinine Equation (2021)    Anion gap 14 5 - 15    Comment: Performed at [facility], [address], [place], [place] [address]    Blood Alcohol level:  Lab Results  Component Value Date   ETH <10 10/01/2020   ETH <10 02/29/2020    Metabolic Disorder Labs: Lab Results  Component Value Date   HGBA1C 5.7 (H) 10/01/2020   MPG 116.89 10/01/2020   MPG 116.89 03/14/2020   Lab Results  Component Value Date   PROLACTIN 13.6 04/16/2015   Lab Results  Component Value Date   CHOL 171 10/01/2020   TRIG 68 10/01/2020   HDL 64 10/01/2020   CHOLHDL 2.7 10/01/2020   VLDL 14 10/01/2020   LDLCALC 93 10/01/2020   LDLCALC 118 (H) 04/16/2015    Physical Findings: AIMS: Facial and Oral Movements Muscles of Facial Expression: None, normal Lips and Perioral Area: None, normal Jaw: None, normal Tongue: None, normal,Extremity Movements Upper (arms, wrists, hands, fingers): None, normal Lower (legs, knees, ankles, toes): None, normal, Trunk Movements Neck, shoulders, hips: None, normal,  Overall Severity Severity of abnormal movements (highest score from questions above): None, normal Incapacitation due to abnormal movements: None, normal Patient's awareness of abnormal movements (rate only patient's report): No Awareness, Dental Status Current problems with teeth and/or dentures?: No Does patient usually wear dentures?: No   Musculoskeletal: Strength & Muscle Tone: UTA - patient uncooperative for testing Gait & Station: normal, steady Patient leans: [name]  Psychiatric Specialty Exam: Physical Exam Vitals reviewed.  HENT:     Head: Normocephalic.  Pulmonary:     Effort: Pulmonary effort is normal.  Neurological:     Mental Status: She is alert.     Review of Systems - denies CP, SOB, N/V/D  Blood pressure (!) 90/52, pulse 88, temperature 98 F (36.7 C), temperature source Oral, resp. rate 18, height 5\' 4"  (1.626 m), weight 49.9 kg, SpO2 100 %.Body mass index is 18.88 kg/m.  General Appearance: thin, appears older than stated age, minimally cooperative for exam  Eye Contact:  Fair  Speech:  Mumbling quality at times, regular fluency and rate  Volume:  Increased  Mood:  Angry and Irritable  Affect:  Guarded, irritable  Thought Process:  Disorganized  Orientation:  Oriented to self and month not year, day of week or President  Thought Content:  Illogical and endorses AH of the government talking to her through a network and appears paranoid on exam; has delusional thinking and paranoia about food on the unit; will not answer questions about ideas of referenece or first rank symptoms  Suicidal Thoughts:  Denied  Homicidal Thoughts: Denied  Memory:  Recent;   Poor  Judgement:  Impaired  Insight:  Lacking  Psychomotor Activity:   paces in room at times when upset - sitting  on bench for most of discussion without noted tremors  Concentration:  Concentration: Poor and Attention Span: Poor  Recall:  Poor  Fund of Knowledge:  Limted secondary to psychosis   Language:   Fair  Akathisia:  Negative  AIMS (if indicated):   would not cooperate for testing today  Assets:  Desire for Improvement Housing Resilience Social Support Others:  guardianship  ADL's:  Independent  Cognition:  Impaired,  Mild secondary to psychosis  Sleep:  Number of Hours: 6.5    Treatment Plan Summary: Diagnoses / Active Problems: Schizophrenia by hx Hypotension/poor po intake  PLAN: 1. Safety and Monitoring:  -- Involuntary admission to inpatient psychiatric unit for safety, stabilization and treatment (patient has guardian)  -- Daily contact with patient to assess and evaluate symptoms and progress in treatment  -- Patient's case to be discussed in multi-disciplinary team meeting  -- Observation Level : q15 minute checks  -- Vital signs:  q12 hours  -- Precautions: suicide, elopement, and assault  2. Psychiatric Diagnoses and Treatment:  Schizophrenia by hx  -- Continue Haldol 5mg  po bid as bridging medication while Haldol dec becomes therapeutic. Received Haldol dec 100mg  on 10/02/20 and Haldol dec 100mg  on 10/04/20.  -- Has PRN Haldol, Ativan, and Benadryl IM and po for agitation protocol  -- Continue Cogentin 0.5mg  bid   -- Metabolic profile and EKG monitoring obtained while on an atypical antipsychotic (BMI:18.88 Lipid Panel:cholesterol 171, triglycerides 68, HDL 64, LDL 93;  HbgA1c:5.7; 10/04/20 and encouraging patient to allow EKG)  -- Encouraged patient to participate in unit milieu and in scheduled group therapies   -- Short Term Goals: Ability to identify changes in lifestyle to reduce recurrence of condition will improve, Ability to demonstrate self-control will improve and Ability to identify and develop effective coping behaviors will improve  -- Long Term Goals: Improvement in symptoms so as ready for discharge   3. Medical Issues Being Addressed:   Hypotension and Creatinine 1.24 on admission with anorexia  -- Encouraging po hydration and  Boost/Ensure/Gatorade  -- Repeat CMP shows: creatinine 1.19 trending down from 1.24 with BUN 19  -- Checking q4 hour vitals while awake for BP and HR - may need IVF if clinically worsens  -- Hold Remeron to avoid further hypotension at this time   -- Continue Megace 20mg  bid  4. Discharge Planning:   -- Social work and case management to assist with discharge planning and identification of hospital follow-up needs prior to discharge  -- Estimated LOS: 10-14 days  -- Discharge Concerns: Need to establish a safety plan; Medication compliance and effectiveness  -- Discharge Goals: Return home with outpatient referrals for mental health follow-up including medication management/psychotherapy  , MD, FAPA 10/07/2020, 1:58 PM

## 2020-10-07 NOTE — Plan of Care (Signed)
  Problem: Education: Goal: Knowledge of [facility] Education information/materials will improve Outcome: Progressing Goal: Emotional status will improve Outcome: Progressing Goal: Mental status will improve Outcome: Progressing   

## 2020-10-07 NOTE — Progress Notes (Signed)
Recreation Therapy Notes  Date: 3.21.22 Time: 1000 Location: 500 Hall Dayroom  Group Topic: Coping Skills  Goal Area(s) Addresses:  Patient will identify positive coping skills. Patient will identify benefit of coping skills. Patient will identify benefit of using coping skills post d/c.  Intervention: Mind map, Pencils, [employer] Erase Markers  Activity:  Coping Skills.  Patients were given a blank mind map.  LRT and patients filled in the first 8 boxes together with situations (depression, anxiety, social association, stress, eating disorder, self care, finances and education) in which coping skills could be used.  Patients were then given time to come up with at least 3 coping skills for each situation identified.  After this, the group would come back together and LRT would write the responses on the board.    Education: Pharmacologist, Building control surveyor.   Education Outcome: Acknowledges understanding/In group clarification offered/Needs additional education.   Clinical Observations/Feedback: Pt did not attend group session.    Caroll Rancher, LRT/CTRS         Caroll Rancher A 10/07/2020 12:20 PM

## 2020-10-07 NOTE — Progress Notes (Signed)
Recreation Therapy Notes  3.21.22  1254:  LRT went to patient room to complete recreation therapy assessment.  Pt declined to complete assessment.  LRT will attempt to complete assessment at a later time.    Caroll Rancher, LRT/CTRS   Lillia Abed, Demeshia Sherburne A 10/07/2020 1:12 PM

## 2020-10-07 NOTE — Progress Notes (Signed)
Adult Psychoeducational Group Note  Date:  10/07/2020 Time:  11:17 PM  Group Topic/Focus:  Wrap-Up Group:   The focus of this group is to help patients review their daily goal of treatment and discuss progress on daily workbooks.  Participation Level:  Did Not Attend  Participation Quality:  Did Not Attend  Affect:  Did Not Attend  Cognitive:  Did Not Attend  Insight: None  Engagement in Group:  Did Not Attend  Modes of Intervention:  Did Not Attend  Additional Comments:  Pt did not attend evening wrap up group tonight.  Felipa Furnace 10/07/2020, 11:17 PM

## 2020-10-07 NOTE — Progress Notes (Signed)
Progress note    10/07/20 0810  Psych Admission Type (Psych Patients Only)  Admission Status Involuntary  Psychosocial Assessment  Patient Complaints Agitation;Anxiety;Confusion  Eye Contact Avoids;Brief  Facial Expression Angry;Anxious;Pinched  Affect Angry;Anxious;Apprehensive;Irritable;Preoccupied  Speech Logical/coherent  Interaction Avoidant;Forwards little;Guarded;Minimal  Motor Activity Slow;Unsteady  Appearance/Hygiene Disheveled  Behavior Characteristics Anxious;Guarded;Irritable  Mood Anxious;Suspicious;Irritable;Pleasant  Thought Process  Coherency Circumstantial;Concrete thinking  Content Blaming others;Paranoia  Delusions Controlled;Persecutory  Perception WDL  Hallucination None reported or observed  Judgment Poor  Confusion None  Danger to Self  Current suicidal ideation? Denies  Danger to Others  Danger to Others None reported or observed

## 2020-10-08 MED ORDER — BOOST / RESOURCE BREEZE PO LIQD CUSTOM
1.0000 | Freq: Four times a day (QID) | ORAL | Status: DC
  Administered 2020-10-08 – 2020-10-09 (×3): 1 via ORAL
  Filled 2020-10-08 (×7): qty 1

## 2020-10-08 NOTE — Plan of Care (Signed)
  Problem: Education: Goal: Verbalization of understanding the information provided will improve Outcome: Progressing   Problem: Activity: Goal: Interest or engagement in activities will improve Outcome: Progressing   Problem: Coping: Goal: Ability to demonstrate self-control will improve Outcome: Progressing   

## 2020-10-08 NOTE — Progress Notes (Signed)
Recreation Therapy Notes  Date: 3.22.22 Time: 0945 Location: 500 Hall Dayroom  Group Topic: Wellness  Goal Area(s) Addresses:  Patient will define components of whole wellness. Patient will verbalize benefit of whole wellness.  Intervention:  Music  Activity: Exercise.  LRT lead group in a number of stretches to loosen up the muscles.  After that, each patient  took turns leading the group in an exercise of their choice.  This gropup aims for at least 30 good minutes of exercise.  Patients were able to take a break or get water if needed.    Education: Wellness, Building control surveyor.   Education Outcome: Acknowledges education/In group clarification offered/Needs additional education.   Clinical Observations/Feedback:  Pt did not attend group session.    Caroll Rancher, LRT/CTRS   Caroll Rancher A 10/08/2020 10:57 AM

## 2020-10-08 NOTE — Progress Notes (Signed)
Pt continues to isolate to room.     10/08/20 2300  Psych Admission Type (Psych Patients Only)  Admission Status Involuntary  Psychosocial Assessment  Patient Complaints Anxiety;Agitation  Eye Contact Avoids;Brief  Facial Expression Angry;Anxious;Pinched  Affect Anxious;Irritable;Preoccupied  Speech Logical/coherent;Soft  Interaction Avoidant;Forwards little;Guarded;Minimal  Motor Activity Slow;Unsteady  Appearance/Hygiene Disheveled  Behavior Characteristics Cooperative  Mood Anxious  Thought Process  Coherency Circumstantial;Concrete thinking  Content Ambivalence;Blaming others;Paranoia  Delusions Controlled;Paranoid  Perception WDL  Hallucination None reported or observed  Judgment Poor  Confusion None  Danger to Self  Current suicidal ideation? Denies  Danger to Others  Danger to Others None reported or observed

## 2020-10-08 NOTE — Progress Notes (Signed)
Recreation Therapy Notes  3.22.22 13:39: LRT went to patient room to make another attempt to complete recreation therapy assessment.  Pt again declined to complete assessment.     Caroll Rancher, LRT/CTRS   Caroll Rancher A 10/08/2020 2:05 PM

## 2020-10-08 NOTE — Progress Notes (Signed)
Pt remains isolative to her room, minimal, irritable, and guarded with interactions. Pt was encouraged to try to stay out of her room as much as possible and attend groups. Pt doesn't think she needs to attend groups and is preoccupied about discharge. When asked about hallucinations, she said that is not why she is here. Fluids were provided and pt was encouraged to drink. Pt said that she doesn't like drinking Ensure because it gives her gas. Pt continues to be paranoid and hasn't been eating much of her meals. She denies any dizziness or feeling lightheaded. Pt said that the sheriff brought her in. Pt denies SI/HI and AVH. Active listening, reassurance, and support provided. Medications administered as ordered by provider. Q 15 min safety checks continue. Pt's safety has been maintained.   10/07/20 2100  Psych Admission Type (Psych Patients Only)  Admission Status Involuntary  Psychosocial Assessment  Patient Complaints Anxiety;Irritability;Confusion  Eye Contact Avoids;Brief  Facial Expression Angry;Anxious;Pinched  Affect Anxious;Irritable;Preoccupied  Speech Logical/coherent;Soft;Slurred  Interaction Avoidant;Forwards little;Guarded;Isolative;Minimal  Motor Activity Slow;Unsteady  Appearance/Hygiene Disheveled  Behavior Characteristics Anxious;Guarded;Irritable  Mood Anxious;Irritable;Preoccupied;Suspicious  Thought Process  Coherency Circumstantial;Concrete thinking  Content Blaming others;Preoccupation;Paranoia  Delusions Controlled;Paranoid  Perception WDL  Hallucination None reported or observed  Judgment Poor  Confusion None  Danger to Self  Current suicidal ideation? Denies  Danger to Others  Danger to Others None reported or observed
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
## 2020-10-08 NOTE — Progress Notes (Signed)
NUTRITION ASSESSMENT  Pt identified as at risk on the Malnutrition Screen Tool  RD working remotely. Discussed patient with her nurse.   INTERVENTION: -Increase Boost Breeze po QID, each supplement provides 250 kcal and 9 grams of protein   -Discontinue Ensure Enlive po BID, patient doesn't like them   NUTRITION DIAGNOSIS: Inadequate oral intake related to sub-optimal po's as evidenced by nursing report refusing meals/ foods prepared by someone else.  Goal: Pt to meet >/= 90% of their estimated nutrition needs.  Monitor:  PO intake  Assessment:  60 y.o. female -Patient has a history of schizophrenia and non compliance with medication. Talked with nurse. She is refusing meals due to being afraid to eat food that someone else has prepared. Pt is also offered choice of unit snacks mid-morning and mid-afternoon which she has also been refusing.   Patient is primarily drinking oral supplements, bottle water, Gatorade. She likes the [facility] but is not accepting Ensure. Will discontinue Ensure and increase Boost.   Per weight history she has maintained an average weight of 50-52 kg the past 6 months.  Height: Ht Readings from Last 1 Encounters:  10/05/20 5\' 4"  (1.626 m)    Weight: Wt Readings from Last 1 Encounters:  10/05/20 49.9 kg    Weight Hx: Wt Readings from Last 10 Encounters:  10/05/20 49.9 kg  04/04/20 52.2 kg  03/26/20 50.3 kg  03/25/20 50.3 kg  11/17/17 68 kg  04/12/15 64 kg  11/06/11 58.1 kg    BMI:  Body mass index is 18.88 kg/m. Pt meets criteria for borderline underweight based on current BMI.  Estimated Nutritional Needs: Kcal: 30-33 kcal/kg to promote weight gain and prevent further loss Protein: >1 gram protein/kg Fluid: 1 ml/kcal  Diet Order:  Diet Order            Diet regular Room service appropriate? Yes; Fluid consistency: Thin  Diet effective now                Lab results-CMP and medications reviewed.  Hgb- A1C-5.7% average  glucose 116.9 mg/dl  11/08/11 MS,RD,CSG,LDN Pager: #[phone]

## 2020-10-09 DIAGNOSIS — F200 Paranoid schizophrenia: Secondary | ICD-10-CM | POA: Diagnosis not present

## 2020-10-09 MED ORDER — BENZTROPINE MESYLATE 0.5 MG PO TABS: 0.5000 mg | ORAL_TABLET | Freq: Two times a day (BID) | ORAL | 0 refills | Status: DC

## 2020-10-09 MED ORDER — MEGESTROL ACETATE 20 MG PO TABS: 20.0000 mg | ORAL_TABLET | Freq: Two times a day (BID) | ORAL | 0 refills | Status: DC

## 2020-10-09 MED ORDER — TRAZODONE HCL 50 MG PO TABS: 50.0000 mg | ORAL_TABLET | Freq: Every evening | ORAL | 0 refills | Status: DC | PRN

## 2020-10-09 MED ORDER — HALOPERIDOL 5 MG PO TABS: 5.0000 mg | ORAL_TABLET | Freq: Two times a day (BID) | ORAL | 0 refills | Status: DC

## 2020-10-09 NOTE — Progress Notes (Signed)
  [facility] Adult Case Management Discharge Plan :  Will you be returning to the same living situation after discharge:  Yes,  to home At discharge, do you have transportation home?: Yes,  mother to pick this patient up Do you have the ability to pay for your medications: Yes,  has insurance  Release of information consent forms completed and in the chart;  Patient's signature needed at discharge.  Patient to Follow up at:  Follow-up Information    [facility] Follow up.   Why: ACTT to follow up with you at discharge. Contact information: [address] [place] [address] [phone]               Next level of care provider has access to [facility] Link:no  Safety Planning and Suicide Prevention discussed: Yes,  with sister  Have you used any form of tobacco in the last 30 days? (Cigarettes, Smokeless Tobacco, Cigars, and/or Pipes): Yes  Has patient been referred to the Quitline?: Patient refused referral  Patient has been referred for addiction treatment: Pt. refused referral  Otelia Santee, LCSW 10/09/2020, 9:15 AM
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
## 2020-10-09 NOTE — Progress Notes (Signed)
Pt discharged to lobby. Pt was stable and appreciative at that time. All papers and prescriptions were given and valuables returned. Verbal understanding expressed. Denies SI/HI and A/VH. Pt given opportunity to express concerns and ask questions.  

## 2020-10-09 NOTE — [facility] Suicide Risk Assessment (Signed)
[facility] Discharge Suicide Risk Assessment   Principal Problem: Paranoid schizophrenia [facility]) Discharge Diagnoses: Principal Problem:   Paranoid schizophrenia (HCC)   Total Time spent with patient: 20 minutes  Musculoskeletal: Strength & Muscle Tone: within normal limits Gait & Station: normal Patient leans: [name]  Psychiatric Specialty Exam: Review of Systems  Psychiatric/Behavioral: Positive for behavioral problems and dysphoric mood.  All other systems reviewed and are negative.   Blood pressure (!) 84/59, pulse 87, temperature 98.2 F (36.8 C), temperature source Oral, resp. rate 18, height 5\' 4"  (1.626 m), weight 49.9 kg, SpO2 98 %.Body mass index is 18.88 kg/m.  General Appearance: Fairly Groomed  ::  Fair  Speech:  Normal [phone]  Volume:  Normal  Mood:  Dysphoric  Affect:  Flat  Thought Process:  Goal Directed and Descriptions of Associations: Loose  Orientation:  Full (Time, Place, and Person)  Thought Content:  Delusions and Paranoid Ideation  Suicidal Thoughts:  No  Homicidal Thoughts:  No  Memory:  Immediate;   Fair Recent;   Fair Remote;   Fair  Judgement:  Impaired  Insight:  Lacking  Psychomotor Activity:  Normal  Concentration:  Fair  Recall:  [id number] of Knowledge:Fair  Language: Good  Akathisia:  Negative  Handed:  Right  AIMS (if indicated):     Assets:  Desire for Improvement Resilience Social Support  Sleep:  Number of Hours: 9.25  Cognition: WNL  ADL's:  Intact   Mental Status Per Nursing Assessment::   On Admission:  NA  Demographic Factors:  Low socioeconomic status and Unemployed  Loss Factors: NA  Historical Factors: Impulsivity  Risk Reduction Factors:   Living with another person, especially a relative and Positive social support  Continued Clinical Symptoms:  Schizophrenia:   Paranoid or undifferentiated type  Cognitive Features That Contribute To Risk:  Closed-mindedness    Suicide Risk:  Minimal: No  identifiable suicidal ideation.  Patients presenting with no risk factors but with morbid ruminations; may be classified as minimal risk based on the severity of the depressive symptoms    Plan Of Care/Follow-up recommendations:  Activity:  ad lib  [phone], MD 10/09/2020, 8:05 AM

## 2020-10-09 NOTE — Care Management Important Message (Signed)
Medicare IM given to Meredith Rumbley, LCSW to give to the patient.  

## 2021-09-12 ENCOUNTER — Inpatient Hospital Stay (HOSPITAL_COMMUNITY)
Admission: EM | Admit: 2021-09-12 | Discharge: 2021-10-02 | DRG: 640 | Disposition: A | Discharge status: Other Acute Inpatient Hospital | Payer: Medicare Other | Attending: Family Medicine | Admitting: Family Medicine

## 2021-09-12 DIAGNOSIS — Z4682 Encounter for fitting and adjustment of non-vascular catheter: Secondary | ICD-10-CM | POA: Diagnosis not present

## 2021-09-12 DIAGNOSIS — Z91199 Patient's noncompliance with other medical treatment and regimen due to unspecified reason: Secondary | ICD-10-CM | POA: Diagnosis not present

## 2021-09-12 DIAGNOSIS — E872 Acidosis, unspecified: Secondary | ICD-10-CM

## 2021-09-12 DIAGNOSIS — E876 Hypokalemia: Secondary | ICD-10-CM | POA: Diagnosis not present

## 2021-09-12 DIAGNOSIS — F17210 Nicotine dependence, cigarettes, uncomplicated: Secondary | ICD-10-CM | POA: Diagnosis present

## 2021-09-12 DIAGNOSIS — D638 Anemia in other chronic diseases classified elsewhere: Secondary | ICD-10-CM | POA: Diagnosis not present

## 2021-09-12 DIAGNOSIS — Z79899 Other long term (current) drug therapy: Secondary | ICD-10-CM | POA: Diagnosis not present

## 2021-09-12 DIAGNOSIS — Z681 Body mass index (BMI) 19 or less, adult: Secondary | ICD-10-CM | POA: Diagnosis not present

## 2021-09-12 DIAGNOSIS — Z9114 Patient's other noncompliance with medication regimen: Secondary | ICD-10-CM | POA: Diagnosis not present

## 2021-09-12 DIAGNOSIS — F200 Paranoid schizophrenia: Secondary | ICD-10-CM | POA: Diagnosis present

## 2021-09-12 DIAGNOSIS — D649 Anemia, unspecified: Secondary | ICD-10-CM | POA: Diagnosis not present

## 2021-09-12 DIAGNOSIS — E162 Hypoglycemia, unspecified: Secondary | ICD-10-CM | POA: Diagnosis not present

## 2021-09-12 DIAGNOSIS — E878 Other disorders of electrolyte and fluid balance, not elsewhere classified: Secondary | ICD-10-CM | POA: Diagnosis not present

## 2021-09-12 DIAGNOSIS — E861 Hypovolemia: Secondary | ICD-10-CM | POA: Diagnosis not present

## 2021-09-12 DIAGNOSIS — F29 Unspecified psychosis not due to a substance or known physiological condition: Secondary | ICD-10-CM | POA: Diagnosis not present

## 2021-09-12 DIAGNOSIS — E871 Hypo-osmolality and hyponatremia: Secondary | ICD-10-CM

## 2021-09-12 DIAGNOSIS — I472 Ventricular tachycardia, unspecified: Secondary | ICD-10-CM | POA: Diagnosis not present

## 2021-09-12 DIAGNOSIS — F061 Catatonic disorder due to known physiological condition: Secondary | ICD-10-CM | POA: Diagnosis not present

## 2021-09-12 DIAGNOSIS — Z888 Allergy status to other drugs, medicaments and biological substances status: Secondary | ICD-10-CM

## 2021-09-12 DIAGNOSIS — K5900 Constipation, unspecified: Secondary | ICD-10-CM | POA: Diagnosis not present

## 2021-09-12 DIAGNOSIS — R001 Bradycardia, unspecified: Secondary | ICD-10-CM | POA: Diagnosis not present

## 2021-09-12 DIAGNOSIS — R339 Retention of urine, unspecified: Secondary | ICD-10-CM | POA: Diagnosis not present

## 2021-09-12 DIAGNOSIS — F22 Delusional disorders: Secondary | ICD-10-CM

## 2021-09-12 DIAGNOSIS — K319 Disease of stomach and duodenum, unspecified: Secondary | ICD-10-CM | POA: Diagnosis not present

## 2021-09-12 DIAGNOSIS — F2089 Other schizophrenia: Secondary | ICD-10-CM | POA: Diagnosis not present

## 2021-09-12 DIAGNOSIS — Z781 Physical restraint status: Secondary | ICD-10-CM | POA: Diagnosis not present

## 2021-09-12 DIAGNOSIS — I959 Hypotension, unspecified: Secondary | ICD-10-CM | POA: Diagnosis not present

## 2021-09-12 DIAGNOSIS — E8809 Other disorders of plasma-protein metabolism, not elsewhere classified: Secondary | ICD-10-CM | POA: Diagnosis not present

## 2021-09-12 DIAGNOSIS — Z886 Allergy status to analgesic agent status: Secondary | ICD-10-CM

## 2021-09-12 DIAGNOSIS — Z20822 Contact with and (suspected) exposure to covid-19: Secondary | ICD-10-CM | POA: Diagnosis not present

## 2021-09-12 DIAGNOSIS — R633 Feeding difficulties, unspecified: Secondary | ICD-10-CM

## 2021-09-12 DIAGNOSIS — Z5320 Procedure and treatment not carried out because of patient's decision for unspecified reasons: Secondary | ICD-10-CM | POA: Diagnosis not present

## 2021-09-12 DIAGNOSIS — K2970 Gastritis, unspecified, without bleeding: Secondary | ICD-10-CM | POA: Diagnosis present

## 2021-09-12 DIAGNOSIS — R1310 Dysphagia, unspecified: Secondary | ICD-10-CM | POA: Diagnosis present

## 2021-09-12 DIAGNOSIS — K2950 Unspecified chronic gastritis without bleeding: Secondary | ICD-10-CM | POA: Diagnosis not present

## 2021-09-12 DIAGNOSIS — R410 Disorientation, unspecified: Secondary | ICD-10-CM | POA: Diagnosis not present

## 2021-09-12 DIAGNOSIS — F209 Schizophrenia, unspecified: Secondary | ICD-10-CM | POA: Diagnosis not present

## 2021-09-12 DIAGNOSIS — R4182 Altered mental status, unspecified: Secondary | ICD-10-CM | POA: Diagnosis present

## 2021-09-12 DIAGNOSIS — I9589 Other hypotension: Secondary | ICD-10-CM | POA: Diagnosis not present

## 2021-09-12 DIAGNOSIS — E11649 Type 2 diabetes mellitus with hypoglycemia without coma: Secondary | ICD-10-CM | POA: Diagnosis not present

## 2021-09-12 DIAGNOSIS — F23 Brief psychotic disorder: Principal | ICD-10-CM

## 2021-09-12 DIAGNOSIS — R630 Anorexia: Secondary | ICD-10-CM | POA: Diagnosis not present

## 2021-09-12 DIAGNOSIS — R9431 Abnormal electrocardiogram [ECG] [EKG]: Secondary | ICD-10-CM | POA: Diagnosis not present

## 2021-09-12 DIAGNOSIS — E43 Unspecified severe protein-calorie malnutrition: Secondary | ICD-10-CM | POA: Diagnosis not present

## 2021-09-12 LAB — ETHANOL: Alcohol, Ethyl (B): <10 mg/dL (ref <10)

## 2021-09-12 LAB — RESP PANEL BY RT-PCR (FLU A&B, COVID) ARPGX2
Influenza A by PCR: NEGATIVE
Influenza B by PCR: NEGATIVE
SARS Coronavirus 2 by RT PCR: NEGATIVE

## 2021-09-12 LAB — COMPREHENSIVE METABOLIC PANEL
ALT: 16 U/L (ref 0–44)
AST: 32 U/L (ref 15–41)
Albumin: 4.4 g/dL (ref 3.5–5.0)
Alkaline Phosphatase: 54 U/L (ref 38–126)
Anion gap: 8 (ref 5–15)
BUN: 30 mg/dL — ABNORMAL HIGH (ref 6–20)
CO2: 24 mmol/L (ref 22–32)
Calcium: 8.9 mg/dL (ref 8.9–10.3)
Chloride: 108 mmol/L (ref 98–111)
Creatinine, Ser: 1.18 mg/dL — ABNORMAL HIGH (ref 0.44–1.00)
GFR, Estimated: 53 mL/min — ABNORMAL LOW (ref >60)
Glucose, Bld: 104 mg/dL — ABNORMAL HIGH (ref 70–99)
Potassium: 3.5 mmol/L (ref 3.5–5.1)
Sodium: 140 mmol/L (ref 135–145)
Total Bilirubin: 0.2 mg/dL — ABNORMAL LOW (ref 0.3–1.2)
Total Protein: 7.2 g/dL (ref 6.5–8.1)

## 2021-09-12 LAB — CBC
HCT: 38.1 % (ref 36.0–46.0)
Hemoglobin: 12.3 g/dL (ref 12.0–15.0)
MCH: 28.3 pg (ref 26.0–34.0)
MCHC: 32.3 g/dL (ref 30.0–36.0)
MCV: 87.6 fL (ref 80.0–100.0)
Platelets: 173 10*3/uL (ref 150–400)
RBC: 4.35 MIL/uL (ref 3.87–5.11)
RDW: 12.9 % (ref 11.5–15.5)
WBC: 5.9 10*3/uL (ref 4.0–10.5)
nRBC: 0 % (ref 0.0–0.2)

## 2021-09-12 MED ORDER — HALOPERIDOL 5 MG PO TABS
5.0000 mg | ORAL_TABLET | Freq: Two times a day (BID) | ORAL | Status: DC
Start: 2021-09-12 — End: 2021-09-16
  Administered 2021-09-15 – 2021-09-16 (×2): 5 mg via ORAL
  Filled 2021-09-12 (×4): qty 1

## 2021-09-12 MED ORDER — BENZTROPINE MESYLATE 0.5 MG PO TABS
0.5000 mg | ORAL_TABLET | Freq: Two times a day (BID) | ORAL | Status: DC
  Administered 2021-09-16: 0.5 mg via ORAL
  Filled 2021-09-12 (×3): qty 1

## 2021-09-12 MED ORDER — TRAZODONE HCL 50 MG PO TABS
50.0000 mg | ORAL_TABLET | Freq: Every evening | ORAL | Status: DC | PRN
  Filled 2021-09-12 (×3): qty 1

## 2021-09-12 MED ORDER — LORAZEPAM 2 MG/ML IJ SOLN
1.0000 mg | Freq: Once | INTRAMUSCULAR | Status: AC
  Administered 2021-09-12: 1 mg via INTRAMUSCULAR
  Filled 2021-09-12: qty 1

## 2021-09-12 MED ORDER — STERILE WATER FOR INJECTION IJ SOLN
INTRAMUSCULAR | Status: AC
  Filled 2021-09-12: qty 10

## 2021-09-12 MED ORDER — OLANZAPINE 10 MG IM SOLR
5.0000 mg | Freq: Once | INTRAMUSCULAR | Status: AC
  Administered 2021-09-12: 5 mg via INTRAMUSCULAR
  Filled 2021-09-12: qty 10
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
## 2021-09-12 NOTE — BH Assessment (Signed)
Comprehensive Clinical Assessment (CCA) Note  09/12/2021 [name] [id number]  DISPOSITION: Gave clinical report to Paula Reichert, NP who determined Pt meets criteria for inpatient psychiatric treatment. [name], [name] at [name], confirmed adult unit at capacity. Notified Dr [name] and Paula Colace, RN of recommendation via secure message. Pt's sister/legal guardian notified of recommendation.  The patient demonstrates the following risk factors for suicide: Chronic risk factors for suicide include: psychiatric disorder of schizophrenia . Acute risk factors for suicide include: social withdrawal/isolation. Protective factors for this patient include: positive social support. Considering these factors, the overall suicide risk at this point appears to be low. Patient is not appropriate for outpatient follow up due to psychotic symptoms.  Flowsheet Row Admission (Discharged) from 10/05/2020 in [name] from 10/01/2020 in [name] Admission (Discharged) from 04/01/2020 in [name]  C-SSRS RISK CATEGORY No Risk No Risk No Risk      Pt is a 61 year old female who presents unaccompanied to [name] via [facility] after being petitioned for involuntary commitment by her sister/legal guardian, [name] [phone]. Affidavit and petition states: "The petitioner states: The respondent has been diagnosed with schizophrenia and is a Pt at behavioral health, however the respondent has not taken her medication. The respondent is not eating (except junk food) or sleeping. The respondent hallucinates and stays up all night talking to herself. The respondent hears voices, she argues with them, laughs with them, and then curses them out. The respondent becomes angry and aggressive when I show up to visit my mama at the house. The respondent says, [name] has to find somewhere to  live and she is very angry about this but it's mama's house. The respondent has also been diagnosed with anorexia however she is not eating enough to maintain her weight. She is currently losing weight."  Pt refused to participate in assessment. She immediately began yelling and cursing, stating that people are infiltrating her home. She says she knows who these people are but refuses to talk about it. She says she does not take medications and will not take medications. She says there is nothing wrong with her and she is going to get an attorney. Pt appears confused at times.   Per medical record, Pt has a diagnosis of schizophrenia and has been psychiatrically hospitalized several times. She was most recently psychiatrically hospitalized in March 2022 at [name]. She was inpatient at [name] in 2020 due to psychotic symptoms and aggression. She has a history of responding to auditory and visual hallucinations, aggressive behavior, and refusing to eat. She has received ACTT services through [name] in the past.   TTS spoke to Pt's sister, [name] [phone], who said Pt has refused medication since she was discharged from [name] in March 2022. She confirms the information in the [name] and petition. She says Pt has not been physically aggressive towards her elderly mother but has been verbally aggressive and threatening. She says Pt keeps telling her mother she needs to move out of the house they share but her mother owns the house.  Pt is dressed in [name] scrubs and sitting with her arms crossed. She is alert and oriented to person, place and situation. Pt speaks in an aggressive tone, at loud volume and normal pace. Motor behavior appears tense. Eye contact is fair. Pt's mood is angry and affect is congruent with mood.  Thought process is coherent and with delusional content. Pt's insight and judgment are impaired.  Chief Complaint:  Chief Complaint   Patient presents with   Psychiatric Evaluation   Visit Diagnosis: F20.9 Schizophrenia   CCA Screening, Triage and Referral (STR)  Patient Reported Information How did you hear about [place]? Other (Comment) Mudlogger)  Referral name: No data recorded Referral phone number: No data recorded  Whom do you see for routine medical problems? No data recorded Practice/Facility Name: No data recorded Practice/Facility Phone Number: No data recorded Name of Contact: No data recorded Contact Number: No data recorded Contact Fax Number: No data recorded Prescriber Name: No data recorded Prescriber Address (if known): No data recorded  What Is the Reason for Your Visit/Call Today? Pt has diagnosis of schizophrenia and was petitioned for IVC by her sister, [name]. She reports Pt is not taking medications, is reponding to hallucinations, is not eating, and not sleeping. Pt's sister reports Pt is angry and aggressive.  How Long Has This Been Causing You Problems? 1 wk - 1 month  What Do You Feel Would Help You the Most Today? Treatment for Depression or other mood problem; Medication(s)   Have You Recently Been in Any Inpatient Treatment ([name]/Detox/Crisis [name]/28-Day Program)? No data recorded Name/Location of Program/[name]:No data recorded How Long Were You There? No data recorded When Were You Discharged? No data recorded  Have You Ever Received Services From [name] Before? No data recorded Who Do You See at [name]? No data recorded  Have You Recently Had Any Thoughts About Hurting Yourself? No  Are You Planning to Commit Suicide/Harm Yourself At This time? No   Have you Recently Had Thoughts About Hurting Someone [name]? No  Explanation: No data recorded  Have You Used Any Alcohol or Drugs in the Past 24 Hours? -- (Pt refused to answer.)  How Long Ago Did You Use Drugs or Alcohol? No data recorded What Did You Use and How Much? No data  recorded  Do You Currently Have a Therapist/Psychiatrist? -- (Pt refused to answer.)  Name of Therapist/Psychiatrist: No data recorded  Have You Been Recently Discharged From Any Office Practice or Programs? No  Explanation of Discharge From Practice/Program: No data recorded    CCA Screening Triage Referral Assessment Type of Contact: Tele-Assessment  Is this Initial or Reassessment? Initial Assessment  Date Telepsych consult ordered in [facility]:  09/12/21  Time Telepsych consult ordered in [name]:  1805   Patient Reported Information Reviewed? No data recorded Patient Left Without Being Seen? No data recorded Reason for Not Completing Assessment: No data recorded  Collateral Involvement: Sister/legal guardian: Paula Slocumb-Lennon [phone]   Does Patient Have a Automotive engineer Guardian? No data recorded Name and Contact of Legal Guardian: No data recorded If Minor and Not Living with Parent(s), Who has Custody? NA  Is CPS involved or ever been involved? Never  Is APS involved or ever been involved? Never   Patient Determined To Be At Risk for Harm To Self or Others Based on Review of Patient Reported Information or Presenting Complaint? Yes, for Self-Harm  Method: No data recorded Availability of Means: No data recorded Intent: No data recorded Notification Required: No data recorded Additional Information for Danger to Others Potential: No data recorded Additional Comments for Danger to Others Potential: No data recorded Are There Guns or Other Weapons in Your Home? No data recorded Types of Guns/Weapons: No data recorded Are These Weapons Safely Secured?  No data recorded Who Could Verify You Are Able To Have These Secured: No data recorded Do You Have any Outstanding Charges, Pending Court Dates, Parole/Probation? No data recorded Contacted To Inform of Risk of Harm To Self or Others: Family/Significant Other:; Guardian/MH  POA:   Location of Assessment: [name]   Does Patient Present under Involuntary Commitment? Yes  IVC Papers Initial File Date: 09/12/21   [place] of Residence: [place]   Patient Currently Receiving the Following Services: Medication Management   Determination of Need: Emergent (2 hours)   Options For Referral: Inpatient Hospitalization     CCA Biopsychosocial Intake/Chief Complaint:  No data recorded Current Symptoms/Problems: No data recorded  Patient Reported Schizophrenia/Schizoaffective Diagnosis in Past: Yes   Strengths: Pt is strong-willed and cares about her family  Preferences: No data recorded Abilities: No data recorded  Type of Services Patient Feels are Needed: No data recorded  Initial Clinical Notes/Concerns: No data recorded  Mental Health Symptoms Depression:   Change in energy/activity; Increase/decrease in appetite; Sleep (too much or little); Weight gain/loss   Duration of Depressive symptoms:  Greater than two weeks   Mania:   Racing thoughts; Irritability; Change in energy/activity   Anxiety:    Tension   Psychosis:   Delusions; Hallucinations   Duration of Psychotic symptoms:  Greater than six months   Trauma:   None   Obsessions:   None   Compulsions:   None   Inattention:   None   Hyperactivity/Impulsivity:   [name]   Oppositional/Defiant Behaviors:   Aggression towards people/animals; Angry; Argumentative; Temper   Emotional Irregularity:   Potentially harmful impulsivity   Other Mood/Personality Symptoms:   None noted    Mental Status Exam Appearance and self-care  Stature:   Small   Weight:   Thin   Clothing:  No data recorded  Grooming:  No data recorded  Cosmetic use:  No data recorded  Posture/gait:   Rigid   Motor activity:   Agitated   Sensorium  Attention:   Vigilant   Concentration:   Preoccupied   Orientation:   Person; Place; Situation   Recall/memory:   Normal   Affect  and Mood  Affect:   Labile   Mood:   Angry; Irritable   Relating  Eye contact:   Fleeting   Facial expression:   Angry; Tense   Attitude toward examiner:   Argumentative; Hostile; Irritable   Thought and Language  Speech flow:  Loud   Thought content:   Delusions   Preoccupation:   None   Hallucinations:   Auditory; Visual   Organization:  No data recorded  [employer] of Knowledge:   Average   Intelligence:   Average   Abstraction:   Normal   Judgement:   Impaired   Reality Testing:   Distorted   Insight:   Poor   Decision Making:   Impulsive   Social Functioning  Social Maturity:   Self-centered   Social Judgement:   Normal   Stress  Stressors:   Family conflict; Housing   Coping Ability:   Overwhelmed   Skill Deficits:   Environmental health practitioner; Self-control   Supports:   Family     Religion: Religion/Spirituality Are You A Religious Person?:  (Pt refused to answer) How Might This Affect Treatment?: UTA  Leisure/Recreation: Leisure / Recreation Do You Have Hobbies?:  (Pt refused to answer)  Exercise/Diet: Exercise/Diet Do You Exercise?:  (Pt refused to answer) Have You Gained or Lost  A Significant Amount of Weight in the Past Six Months?:  (Pt refused to answer) Do You Follow a Special Diet?:  (Pt refused to answer) Do You Have Any Trouble Sleeping?: Yes Explanation of Sleeping Difficulties: Sister reports Pt up at night   CCA Employment/Education Employment/Work Situation: Employment / Work Situation Employment Situation: On disability Why is Patient on Disability: Mental health How Long has Patient Been on Disability: Unknown Patient's Job has Been Impacted by Current Illness:  (Pt refused to answer) Has Patient ever Been in the [employer]?:  (Pt refused to answer)  Education: Education Is Patient Currently Attending School?:  (Pt refused to answer) Last Grade Completed:  (Pt refused to answer) Did You  Attend College?:  (Pt refused to answer) Did You Have An Individualized Education Program (IIEP):  (Pt refused to answer) Did You Have Any Difficulty At School?:  (Pt refused to answer) Patient's Education Has Been Impacted by Current Illness:  (Pt refused to answer)   CCA Family/Childhood History Family and Relationship History: Family history Marital status:  (Pt refused to answer) Does patient have children?: No  Childhood History:  Childhood History By whom was/is the patient raised?: Both parents Did patient suffer any verbal/emotional/physical/sexual abuse as a child?:  (Pt refused to answer) Did patient suffer from severe childhood neglect?:  (Pt refused to answer) Has patient ever been sexually abused/assaulted/raped as an adolescent or adult?:  (Pt refused to answer) Was the patient ever a victim of a crime or a disaster?:  (Pt refused to answer) Witnessed domestic violence?:  (Pt refused to answer) Has patient been affected by domestic violence as an adult?:  (Pt refused to answer)  Child/Adolescent Assessment:     CCA Substance Use Alcohol/Drug Use: Alcohol / Drug Use Pain Medications: Please see MAR Prescriptions: Please see MAR Over the Counter: Please see MAR History of alcohol / drug use?:  (Pt refused to answer) Longest period of sobriety (when/how long): UTA                         ASAM's:  Six Dimensions of Multidimensional Assessment  Dimension 1:  Acute Intoxication and/or Withdrawal Potential:      Dimension 2:  Biomedical Conditions and Complications:      Dimension 3:  Emotional, Behavioral, or Cognitive Conditions and Complications:     Dimension 4:  Readiness to Change:     Dimension 5:  Relapse, Continued use, or Continued Problem Potential:     Dimension 6:  Recovery/Living Environment:     ASAM Severity Score:    ASAM Recommended Level of Treatment:     Substance use Disorder (SUD)    Recommendations for  Services/Supports/Treatments:    DSM5 Diagnoses: Patient Active Problem List   Diagnosis Date Noted   Malnutrition of moderate degree 03/27/2020   Hypotension 03/26/2020   Anorexia 03/26/2020   Hypokalemia 03/26/2020   Hypocalcemia 03/26/2020   Fall at home, initial encounter 03/26/2020   Hypoglycemia 03/14/2020   Paranoid schizophrenia (HCC) 03/01/2020   Schizophrenia (HCC) 11/26/2017   Noncompliance with diet and medication regimen    Psychoses (HCC)    Hypokalemic alkalosis 11/11/2011   Medically noncompliant 11/11/2011   Schizophrenia, paranoid (HCC) 11/07/2011    Patient Centered Plan: Patient is on the following Treatment Plan(s):  Anxiety and Impulse Control   Referrals to Alternative Service(s): Referred to Alternative Service(s):   Place:   Date:   Time:    Referred to Alternative Service(s):  Place:   Date:   Time:    Referred to Alternative Service(s):   Place:   Date:   Time:    Referred to Alternative Service(s):   Place:   Date:   Time:      @BHCOLLABOFCARE @  Anson Fret, Or[place]ah Greek, [facility]

## 2021-09-12 NOTE — ED Notes (Signed)
Patient is refusing to take her medications  

## 2021-09-13 DIAGNOSIS — F200 Paranoid schizophrenia: Secondary | ICD-10-CM | POA: Diagnosis not present

## 2021-09-13 DIAGNOSIS — F29 Unspecified psychosis not due to a substance or known physiological condition: Secondary | ICD-10-CM | POA: Diagnosis not present

## 2021-09-13 DIAGNOSIS — F2089 Other schizophrenia: Secondary | ICD-10-CM | POA: Diagnosis not present

## 2021-09-13 MED ORDER — OLANZAPINE 10 MG PO TBDP: 10.0000 mg | ORAL_TABLET | Freq: Three times a day (TID) | ORAL | Status: DC | PRN

## 2021-09-13 NOTE — Progress Notes (Signed)
Inpatient Behavioral Health Placement  Pt meets inpatient criteria per Roselyn Bering, NP.  There are no appropriate beds at [facility] per Durwin Glaze, RN. Referral was sent to the following facilities;   Destination Service Provider Address Phone Fax  [facility]  [address] [place] [address] [phone]  [facility]  [address] [place] [address] [phone]  [facility]  [address] [place] [address] [phone]  [facility] [address]  [address], [place] [address] [phone]  [facility]  [address] [place], [place] [address] [phone]  [facility]  [address] [place] [address] [phone]  [facility]  [address] [place] [address] [phone]  [facility]  [address] [place], [place] [address] [phone]  [facility]  [address] [place] [address] [phone]  [facility]  [address], [place] [address] [phone]  [facility]  [address], [place] [address] [phone]  [facility]  [address] [place] [address] [phone]  [facility]  [address], [place] [address] [phone]  [facility]  [address], [place] [address] [phone]  [facility]  [address] [place] [address] [phone]  [facility]  [address] [place] [address] [phone]  [facility]  [address] [place] [address]  [phone]  [facility]  [address] [place] [address] [phone]  [facility]  [address] [place] [address] [phone]  [facility]  [address], [place] [address] [phone]    Situation ongoing,  CSW will follow up.   [name], MSW, LCSWA 09/13/2021  @ 1:00 AM

## 2021-09-13 NOTE — ED Notes (Addendum)
Pt HR found to be 40, maintaining. MD made aware. Pt denies any chest pain, SOB or any complaints at this time.

## 2021-09-13 NOTE — ED Provider Notes (Signed)
Emergency Medicine Observation Re-evaluation Note  [name] is a 61 y.o. female, seen on rounds today.  Pt initially presented to the ED for complaints of Psychiatric Evaluation Currently, the patient is returning to internal stimuli talking to herself and slightly agitated but directable.  Physical Exam  BP 130/75 (BP Location: Right Arm)    Pulse (!) 48    Temp 98.1 F (36.7 C) (Oral)    Resp 18    SpO2 99%  Physical Exam General: sitting on the stretcher in the hall Cardiac: bradycardia Lungs: clear Psych: hallucinating and not able to participate in a conversation  ED Course / MDM  EKG:   I have reviewed the labs performed to date as well as medications administered while in observation.  Recent changes in the last 24 hours include none.  Plan  Current plan is for inpt placement. Demisha Mangelsdorf is under involuntary commitment.      Blanchie Dessert, MD 09/13/21 1415

## 2021-09-13 NOTE — Consult Note (Signed)
Telepsych Consultation   Reason for Consult: Worsening psychosis,.  Referring Physician: EDP Location of Patient: [facility] ED Location of Provider: [place] Department  Patient Identification: [name] MRN:  [id number] Principal Diagnosis: Schizophrenia, paranoid ([place]) Diagnosis:  Principal Problem:   Schizophrenia, paranoid ([place]) Active Problems:   Schizophrenia ([place])  Total Time spent with patient: 30 minutes  Subjective:   [name] is a 61 y.o. female patient, was brought to the ED under IVC due to worsening psychosis in the wake of medication non-compliance. Patient has hx of Schizophrenia (chronic). Is known at the [facility] from her previous admission for mood stabilizations treatments. Her UDS?Toxicology reports were all clear. During this follow-up evaluation, [name] angrily & aggressive reports,  "I'm tired. Leave me alone. You go on".  HPI: Borghild suffers from Schizophrenia, paranoia-type (chronic). She currently presents agitated, uncooperative, angry & as a poor historian. She remains at the ED waiting for inpatient admission as already recommended.  Past Psychiatric History: Schizophrenia.  Risk to Self: Patient is unable to provide this information at this time. Risk to Others: Patient is unable to provide this information at this time (Currently verbally aggressive). Prior Inpatient Therapy: [facility] x multiple times. Prior Outpatient Therapy: Yes.  Past Medical History:  Past Medical History:  Diagnosis Date   Non compliance w medication regimen    Schizophrenia ([place])    No past surgical history on file.  Family History: No family history on file.  Family Psychiatric  History: Patient is unable to provide this information at this time.:   Social History: Single. Social History   Substance and Sexual Activity  Alcohol Use Not Currently   Comment: Refuses to disclose how much     Social History   Substance and Sexual Activity  Drug Use  No   Comment: Refuses to answer    Social History   Socioeconomic History   Marital status: Single    Spouse name: Not on file   Number of children: Not on file   Years of education: Not on file   Highest education level: Not on file  Occupational History   Not on file  Tobacco Use   Smoking status: Some Days    Packs/day: 0.50    Types: Cigarettes   Smokeless tobacco: Never  Vaping Use   Vaping Use: Every day  Substance and Sexual Activity   Alcohol use: Not Currently    Comment: Refuses to disclose how much   Drug use: No    Comment: Refuses to answer   Sexual activity: Never    Comment: refused to answer  Other Topics Concern   Not on file  Social History Narrative   Not on file   Social Determinants of Health   Financial Resource Strain: Not on file  Food Insecurity: Not on file  Transportation Needs: Not on file  Physical Activity: Not on file  Stress: Not on file  Social Connections: Not on file   Additional Social History:  Allergies:   Allergies  Allergen Reactions   Aspirin Hives and Other (See Comments)    Possibly caused hives, per sister   Ziprasidone Hcl Rash and Other (See Comments)    Caused twitching   Labs:  Results for orders placed or performed during the hospital encounter of 09/12/21 (from the past 48 hour(s))  CBC     Status: None   Collection Time: 09/12/21  6:05 PM  Result Value Ref Range   WBC 5.9 4.0 - 10.5  K/uL   RBC 4.35 3.87 - 5.11 MIL/uL   Hemoglobin 12.3 12.0 - 15.0 g/dL   HCT 38.1 36.0 - 46.0 %   MCV 87.6 80.0 - 100.0 fL   MCH 28.3 26.0 - 34.0 pg   MCHC 32.3 30.0 - 36.0 g/dL   RDW 12.9 11.5 - 15.5 %   Platelets 173 150 - 400 K/uL   nRBC 0.0 0.0 - 0.2 %    Comment: Performed at [facility], [place] [address] [place], [place] [address]  Comprehensive metabolic panel     Status: Abnormal   Collection Time: 09/12/21  6:05 PM  Result Value Ref Range   Sodium 140 135 - 145 mmol/L   Potassium 3.5 3.5 -  5.1 mmol/L   Chloride 108 98 - 111 mmol/L   CO2 24 22 - 32 mmol/L   Glucose, Bld 104 (H) 70 - 99 mg/dL    Comment: Glucose reference range applies only to samples taken after fasting for at least 8 hours.   BUN 30 (H) 6 - 20 mg/dL   Creatinine, Ser 1.18 (H) 0.44 - 1.00 mg/dL   Calcium 8.9 8.9 - 10.3 mg/dL   Total Protein 7.2 6.5 - 8.1 g/dL   Albumin 4.4 3.5 - 5.0 g/dL   AST 32 15 - 41 U/L   ALT 16 0 - 44 U/L   Alkaline Phosphatase 54 38 - 126 U/L   Total Bilirubin 0.2 (L) 0.3 - 1.2 mg/dL   GFR, Estimated 53 (L) >60 mL/min    Comment: (NOTE) Calculated using the CKD-EPI Creatinine Equation (2021)    Anion gap 8 5 - 15    Comment: Performed at [facility], [place] [address] [place], [place] [address]  Ethanol     Status: None   Collection Time: 09/12/21  6:05 PM  Result Value Ref Range   Alcohol, Ethyl (B) <10 <10 mg/dL    Comment: (NOTE) Lowest detectable limit for serum alcohol is 10 mg/dL.  For medical purposes only. Performed at [facility], [place] [employer] [address] [place], [place] [address]   Resp Panel by RT-PCR (Flu A&B, Covid) Nasopharyngeal Swab     Status: None   Collection Time: 09/12/21  6:05 PM   Specimen: Nasopharyngeal Swab; Nasopharyngeal(NP) swabs in vial transport medium  Result Value Ref Range   SARS Coronavirus 2 by RT PCR NEGATIVE NEGATIVE    Comment: (NOTE) SARS-CoV-2 target nucleic acids are NOT DETECTED.  The SARS-CoV-2 RNA is generally detectable in upper respiratory specimens during the acute phase of infection. The lowest concentration of SARS-CoV-2 viral copies this assay can detect is 138 copies/mL. A negative result does not preclude SARS-Cov-2 infection and should not be used as the sole basis for treatment or other patient management decisions. A negative result may occur with  improper specimen collection/handling, submission of specimen other than nasopharyngeal swab, presence of viral mutation(s) within  the areas targeted by this assay, and inadequate number of viral copies(<138 copies/mL). A negative result must be combined with clinical observations, patient history, and epidemiological information. The expected result is Negative.  Fact Sheet for Patients:  EntrepreneurPulse.com.au  Fact Sheet for Healthcare Providers:  IncredibleEmployment.be  This test is no t yet approved or cleared by the [place] FDA and  has been authorized for detection and/or diagnosis of SARS-CoV-2 by FDA under an Emergency Use Authorization (EUA). This EUA will remain  in effect (meaning this test can be used) for the duration of  the COVID-19 declaration under Section 564(b)(1) of the Act, 21 U.S.C.section 360bbb-3(b)(1), unless the authorization is terminated  or revoked sooner.       Influenza A by PCR NEGATIVE NEGATIVE   Influenza B by PCR NEGATIVE NEGATIVE    Comment: (NOTE) The Xpert Xpress SARS-CoV-2/FLU/RSV plus assay is intended as an aid in the diagnosis of influenza from Nasopharyngeal swab specimens and should not be used as a sole basis for treatment. Nasal washings and aspirates are unacceptable for Xpert Xpress SARS-CoV-2/FLU/RSV testing.  Fact Sheet for Patients: EntrepreneurPulse.com.au  Fact Sheet for Healthcare Providers: IncredibleEmployment.be  This test is not yet approved or cleared by the [place] FDA and has been authorized for detection and/or diagnosis of SARS-CoV-2 by FDA under an Emergency Use Authorization (EUA). This EUA will remain in effect (meaning this test can be used) for the duration of the COVID-19 declaration under Section 564(b)(1) of the Act, 21 U.S.C. section 360bbb-3(b)(1), unless the authorization is terminated or revoked.  Performed at [facility], [place] [address] [place], [place] [address]    Medications:  Current Facility-Administered  Medications  Medication Dose Route Frequency Provider Last Rate Last Admin   benztropine (COGENTIN) tablet 0.5 mg  0.5 mg Oral BID Lajean Saver, MD       haloperidol (HALDOL) tablet 5 mg  5 mg Oral BID Lajean Saver, MD       OLANZapine zydis (ZYPREXA) disintegrating tablet 10 mg  10 mg Oral TID PRN Lindell Spar I, NP       traZODone (DESYREL) tablet 50 mg  50 mg Oral QHS PRN Lajean Saver, MD       Current Outpatient Medications  Medication Sig Dispense Refill   benztropine (COGENTIN) 0.5 MG tablet Take 1 tablet (0.5 mg total) by mouth 2 (two) times daily. For prevention of drug induced tremors. (Patient not taking: Reported on 09/13/2021) 60 tablet 0   haloperidol (HALDOL) 5 MG tablet Take 1 tablet (5 mg total) by mouth 2 (two) times daily. For mood control (Patient not taking: Reported on 09/13/2021) 60 tablet 0   megestrol (MEGACE) 20 MG tablet Take 1 tablet (20 mg total) by mouth 2 (two) times daily. For cachexia (Patient not taking: Reported on 09/13/2021) 60 tablet 0   traZODone (DESYREL) 50 MG tablet Take 1 tablet (50 mg total) by mouth at bedtime as needed for sleep. (Patient not taking: Reported on 09/13/2021) 30 tablet 0   Musculoskeletal: Strength & Muscle Tone: within normal limits Gait & Station: normal Patient leans: [name]  Psychiatric Specialty Exam:  Presentation  General Appearance: Disheveled  Eye Contact:Fleeting  Speech:Clear and Coherent; Pressured  Speech Volume:Increased  Handedness:Right   Mood and Affect  Mood:Angry; Anxious; Dysphoric; Irritable  Affect:Congruent; Inappropriate; Labile   Thought Process  Thought Processes:Disorganized  Descriptions of Associations:Tangential  Orientation:Partial  Thought Content:Delusions; Illogical; Paranoid Ideation; Tangential  History of Schizophrenia/Schizoaffective disorder:Yes  Duration of Psychotic Symptoms:Greater than six months  Hallucinations:Hallucinations: Auditory Description of Auditory  Hallucinations: Patient is unable to provide this information at this time.  Ideas of Reference:Paranoia; Delusions  Suicidal Thoughts:Suicidal Thoughts: Yes, Passive SI Passive Intent and/or Plan: Without Intent; Without Plan; Without Means to Carry Out; Without Access to Means  Homicidal Thoughts:Homicidal Thoughts: No   Sensorium  Memory:Immediate Fair; Recent Poor; Remote Poor  Judgment:Impaired  Insight:Lacking  Executive Functions  Concentration:Poor  Attention Span:Poor  Recall:Poor  Fund of Knowledge:Poor  Language:Fair  Psychomotor Activity  Psychomotor Activity:Psychomotor Activity: Increased; Restlessness   Assets  Assets:Desire for Improvement; Social Support  Sleep  Sleep:Sleep: Poor   Physical Exam: Physical Exam Vitals and nursing note reviewed.  HENT:     Nose: Nose normal.  Cardiovascular:     Rate and Rhythm: Normal rate.     Pulses: Normal pulses.  Pulmonary:     Effort: Pulmonary effort is normal.  Musculoskeletal:        General: Normal range of motion.     Cervical back: Normal range of motion.  Neurological:     General: No focal deficit present.     Mental Status: She is alert.   Review of Systems  Constitutional:  Negative for chills and fever.  HENT:  Negative for congestion and sore throat.   Respiratory:  Negative for cough, shortness of breath and wheezing.   Cardiovascular:  Negative for chest pain and palpitations.  Gastrointestinal:  Negative for heartburn, nausea and vomiting.  Neurological:  Negative for dizziness, tremors and seizures.  Endo/Heme/Allergies:        Allergie: ASA, Geodon  Psychiatric/Behavioral:  Positive for hallucinations. Negative for memory loss and substance abuse. The patient is nervous/anxious and has insomnia.   Blood pressure 130/75, pulse (!) 48, temperature 98.1 F (36.7 C), temperature source Oral, resp. rate 18, SpO2 99 %. There is no height or weight on file to calculate  BMI.  Treatment Plan Summary: Daily contact with patient to assess and evaluate symptoms and progress in treatment and Medication management.  Schizophrenia. -Continue Cogentin 0.5 mg po bid. -Continue Haldol 5 mg po bid.  Other prn medications.  -Continue Trazodone 50 mg po Q hs prn for insomnia. -Initiated Zyprexa zydis 10 mg po tid prn for agitation/psychosis.   Disposition: Recommend psychiatric Inpatient admission when medically cleared.  This service was provided via telemedicine using a 2-way, interactive audio and video technology.  Names of all persons participating in this telemedicine service and their role in this encounter. Name: Lindell Spar. Role: pmhnp, fnp-bc  Name:  Role:   Name:  Role:   Name:  Role:    Lindell Spar, NP, pmhnp, fnp-bc 09/13/2021 3:18 PM

## 2021-09-13 NOTE — Progress Notes (Signed)
Jasmine with, Kathryn intake/[facility], contacted CSW in reference to a referral faxed out for this patient to be reviewed for placement. It was reported that the patient in pending further review by the intake nurse at [facility].  ° °Sharmeka Palmisano, MSW, LCSW-A, LCAS-A °Phone: [phone] °Disposition/TOC ° °

## 2021-09-14 DIAGNOSIS — F2089 Other schizophrenia: Secondary | ICD-10-CM | POA: Diagnosis not present

## 2021-09-14 DIAGNOSIS — F29 Unspecified psychosis not due to a substance or known physiological condition: Secondary | ICD-10-CM | POA: Diagnosis not present

## 2021-09-14 LAB — CBG MONITORING, ED
Glucose-Capillary: 245 mg/dL — ABNORMAL HIGH (ref 70–99)
Glucose-Capillary: 45 mg/dL — ABNORMAL LOW (ref 70–99)
Glucose-Capillary: 136 mg/dL — ABNORMAL HIGH (ref 70–99)

## 2021-09-14 LAB — RAPID URINE DRUG SCREEN, HOSP PERFORMED
Amphetamines: NOT DETECTED
Barbiturates: NOT DETECTED
Benzodiazepines: NOT DETECTED
Cocaine: NOT DETECTED
Opiates: NOT DETECTED
Tetrahydrocannabinol: NOT DETECTED

## 2021-09-14 MED ORDER — DEXTROSE 50 % IV SOLN
1.0000 | Freq: Once | INTRAVENOUS | Status: AC
  Administered 2021-09-14: 50 mL via INTRAVENOUS
  Filled 2021-09-14: qty 50

## 2021-09-14 NOTE — ED Provider Notes (Signed)
Emergency Medicine Observation Re-evaluation Note  [name] is a 61 y.o. female, seen on rounds today.  Pt initially presented to the ED for complaints of Psychiatric Evaluation Currently, the patient is resting.  Physical Exam  BP 122/68 (BP Location: Left Arm)    Pulse 61    Temp 98.1 F (36.7 C) (Oral)    Resp 17    SpO2 100%  Physical Exam Pulmonary:     Effort: Pulmonary effort is normal.  Neurological:     General: No focal deficit present.     Mental Status: She is alert.     ED Course / MDM  EKG:   I have reviewed the labs performed to date as well as medications administered while in observation.  Recent changes in the last 24 hours include nothing.  Plan  Current plan is for inpt treatment. [name] is under involuntary commitment.      [place], DO 09/14/21 [phone]

## 2021-09-14 NOTE — ED Notes (Addendum)
Pt refused to eat her meal and to drink anything RN notify

## 2021-09-14 NOTE — ED Notes (Addendum)
Patient refused to eat her dinner meal and drink again RN notify

## 2021-09-14 NOTE — ED Notes (Signed)
Pt refusing to drink fluids or eat anything to help get blood sugar back to baseline. This RN attempted to start IV on pt, unsuccessful. Pt requesting another nurse try

## 2021-09-14 NOTE — ED Provider Notes (Signed)
°  Physical Exam  BP 116/72 (BP Location: Right Arm)    Pulse (!) 53    Temp 98 F (36.7 C) (Oral)    Resp 18    SpO2 100%   Physical Exam  Procedures  Procedures  ED Course / MDM    Medical Decision Making Amount and/or Complexity of Data Reviewed Labs: ordered.  Risk Prescription drug management.    Received care of patient from previous providers.  Please see prior notes for history, physical and care.  Briefly this is a 61 year old female with a history of schizophrenia with worsening psychosis, medication noncompliance.  She has been refusing to eat for the last 24 hours.  She is currently under IVC.  Point-of-care glucose is 45.  Ordered D50 given her refusal to eat.  Repeat glucose 245, then 136.  Continuing to await psychiatric placement with her acute psychosis being underlying reason for her refusal to eat and hypoglycemic episode.     Gareth Morgan, MD 09/15/21 [phone]

## 2021-09-14 NOTE — ED Notes (Signed)
Pt refused shower and hygiene care RN notify.

## 2021-09-14 NOTE — ED Notes (Signed)
Pt declining medication admin, food, showers, etc. Pt states she just wants to leave

## 2021-09-14 NOTE — ED Notes (Addendum)
Pt refused to eat her lunch meal and drink. She's refusing to eat her meals again RN notify

## 2021-09-14 NOTE — Progress Notes (Signed)
CSW reached out to [facility] with [facility], who is requesting that the IVC paperwork be faxed to fax number [phone], to be further reviewed for placement.  Crissie Reese, MSW, LCSW-A, LCAS-A Phone: [phone] Disposition/TOC

## 2021-09-15 ENCOUNTER — Encounter (HOSPITAL_COMMUNITY): Payer: Self-pay | Admitting: Family Medicine

## 2021-09-15 DIAGNOSIS — F200 Paranoid schizophrenia: Secondary | ICD-10-CM

## 2021-09-15 DIAGNOSIS — E872 Acidosis, unspecified: Secondary | ICD-10-CM

## 2021-09-15 DIAGNOSIS — F2089 Other schizophrenia: Secondary | ICD-10-CM | POA: Diagnosis not present

## 2021-09-15 DIAGNOSIS — F29 Unspecified psychosis not due to a substance or known physiological condition: Secondary | ICD-10-CM | POA: Diagnosis not present

## 2021-09-15 DIAGNOSIS — E162 Hypoglycemia, unspecified: Secondary | ICD-10-CM | POA: Diagnosis not present

## 2021-09-15 LAB — BASIC METABOLIC PANEL
Anion gap: 16 — ABNORMAL HIGH (ref 5–15)
BUN: 13 mg/dL (ref 6–20)
CO2: 15 mmol/L — ABNORMAL LOW (ref 22–32)
Calcium: 9.6 mg/dL (ref 8.9–10.3)
Chloride: 106 mmol/L (ref 98–111)
Creatinine, Ser: 0.82 mg/dL (ref 0.44–1.00)
GFR, Estimated: >60 mL/min (ref >60)
Glucose, Bld: 95 mg/dL (ref 70–99)
Potassium: 3.7 mmol/L (ref 3.5–5.1)
Sodium: 137 mmol/L (ref 135–145)

## 2021-09-15 LAB — CBG MONITORING, ED
Glucose-Capillary: 73 mg/dL (ref 70–99)
Glucose-Capillary: 52 mg/dL — ABNORMAL LOW (ref 70–99)
Glucose-Capillary: 170 mg/dL — ABNORMAL HIGH (ref 70–99)
Glucose-Capillary: 99 mg/dL (ref 70–99)

## 2021-09-15 MED ORDER — LORAZEPAM 1 MG PO TABS
2.0000 mg | ORAL_TABLET | Freq: Four times a day (QID) | ORAL | Status: DC | PRN
Start: 2021-09-15 — End: 2021-09-22
  Administered 2021-09-17: 2 mg via ORAL
  Filled 2021-09-15 (×2): qty 2

## 2021-09-15 MED ORDER — OLANZAPINE 5 MG PO TBDP
10.0000 mg | ORAL_TABLET | Freq: Three times a day (TID) | ORAL | Status: DC | PRN
  Filled 2021-09-15: qty 2

## 2021-09-15 MED ORDER — SODIUM CHLORIDE 0.9% FLUSH
3.0000 mL | Freq: Two times a day (BID) | INTRAVENOUS | Status: DC
  Administered 2021-09-15 – 2021-09-24 (×5): 3 mL via INTRAVENOUS

## 2021-09-15 MED ORDER — BENZTROPINE MESYLATE 1 MG/ML IJ SOLN
0.5000 mg | Freq: Two times a day (BID) | INTRAMUSCULAR | Status: DC | PRN
  Administered 2021-09-15: 0.5 mg via INTRAMUSCULAR
  Filled 2021-09-15: qty 0.5
  Filled 2021-09-15: qty 2

## 2021-09-15 MED ORDER — STERILE WATER FOR INJECTION IJ SOLN
INTRAMUSCULAR | Status: AC
Start: 2021-09-15 — End: 2021-09-15
  Administered 2021-09-15: 2.1 mL
  Filled 2021-09-15: qty 10

## 2021-09-15 MED ORDER — DEXTROSE 50 % IV SOLN
1.0000 | Freq: Once | INTRAVENOUS | Status: AC
  Administered 2021-09-15: 50 mL via INTRAVENOUS
  Filled 2021-09-15: qty 50

## 2021-09-15 MED ORDER — OLANZAPINE 10 MG IM SOLR
10.0000 mg | Freq: Three times a day (TID) | INTRAMUSCULAR | Status: DC | PRN
  Administered 2021-09-15: 10 mg via INTRAMUSCULAR
  Filled 2021-09-15 (×2): qty 10

## 2021-09-15 MED ORDER — BENZTROPINE MESYLATE 0.5 MG PO TABS: 0.5000 mg | ORAL_TABLET | Freq: Two times a day (BID) | ORAL | Status: DC | PRN

## 2021-09-15 MED ORDER — SODIUM CHLORIDE 0.9% FLUSH: 3.0000 mL | INTRAVENOUS | Status: DC | PRN

## 2021-09-15 MED ORDER — LORAZEPAM 2 MG/ML IJ SOLN
2.0000 mg | Freq: Four times a day (QID) | INTRAMUSCULAR | Status: DC | PRN
  Administered 2021-09-15: 2 mg via INTRAMUSCULAR
  Filled 2021-09-15: qty 1

## 2021-09-15 MED ORDER — SODIUM CHLORIDE 0.9 % IV SOLN: 250.0000 mL | INTRAVENOUS | Status: DC | PRN

## 2021-09-15 MED ORDER — DEXTROSE 10 % IV SOLN: 100.0000 mL | Freq: Once | INTRAVENOUS | Status: DC

## 2021-09-15 NOTE — H&P (Signed)
History and Physical    Patient: [name] [id number] DOB: 12-28-1960 DOA: 09/12/2021 DOS: the patient was seen and examined on 09/15/2021 [facility]  Patient coming from: Home  Chief Complaint:  Chief Complaint  Patient presents with   Psychiatric Evaluation    HPI: [name] is a 61 y.o. female with medical history significant of schizophrenia who was brought into the ED on 2/24 with IVC from family indicating that she has not been taking her medications, hearing voices and having hallucination and delusional thoughts.  Patient has been in the ER awaiting psychiatric inpatient bed placement.  However has been refusing food for the past 3 days.  In the past 24 hours has become more hypoglycemic requiring multiple doses of D50.  Hospitalist was called for medical admission and management of hypoglycemia.  Patient was laying comfortably in bed but agitated on my evaluation.  Refused any exam.  Did not want to tell me her name stating that I was sent from the government.  Review of Systems: Unable to review all systems due to lack of cooperation from patient. Past Medical History:  Diagnosis Date   Non compliance w medication regimen    Schizophrenia ([facility])    No past surgical history on file. Social History:  reports that she has been smoking cigarettes. She has been smoking an average of .5 packs per day. She has never used smokeless tobacco. She reports that she does not currently use alcohol. She reports that she does not use drugs.  Allergies  Allergen Reactions   Aspirin Hives and Other (See Comments)    Possibly caused hives, per sister   Ziprasidone Hcl Rash and Other (See Comments)    Caused twitching    No family history on file.  Prior to Admission medications   Medication Sig Start Date End Date Taking? Authorizing Provider  benztropine (COGENTIN) 0.5 MG tablet Take 1 tablet (0.5 mg total) by mouth 2 (two) times daily. For prevention of drug  induced tremors. Patient not taking: Reported on 09/13/2021 10/09/20   [name], NP  haloperidol (HALDOL) 5 MG tablet Take 1 tablet (5 mg total) by mouth 2 (two) times daily. For mood control Patient not taking: Reported on 09/13/2021 10/09/20   [name], NP  megestrol (MEGACE) 20 MG tablet Take 1 tablet (20 mg total) by mouth 2 (two) times daily. For cachexia Patient not taking: Reported on 09/13/2021 10/09/20   [name], NP  traZODone (DESYREL) 50 MG tablet Take 1 tablet (50 mg total) by mouth at bedtime as needed for sleep. Patient not taking: Reported on 09/13/2021 10/09/20   [name], NP    Physical Exam: Vitals:   09/14/21 1236 09/14/21 2302 09/15/21 0733 09/15/21 1549  BP: 116/72 103/64 102/72 115/74  Pulse: (!) 53 (!) 53 74 62  Resp: 18 17 18 18   Temp: 98 F (36.7 C) 97.6 F (36.4 C) 97.9 F (36.6 C) 97.8 F (36.6 C)  TempSrc: Oral Oral Oral Oral  SpO2: 100% 99% 100% 100%   Physical exam limited due to patient agitation and refusal of exam. Constitutional: Thin female laying flat in bed Eyes: lids and conjunctivae normal ENMT: Mucous membranes are moist. Respiratory: Normal respiratory effort. No accessory muscle use.  Psychiatric: Responds to questions with agitation and paranoia.  Responding to internal stimuli.  Data Reviewed:  BMP with improved creatinine from prior AKI.  However has increasing anion gap of 16 and decreased CO2.  Assessment  and Plan:  Hypoglycemia  -due to refusal to eat since 2/24 -has hx of pre-DM -check CBG q4. Hypoglycemic protocol for D50 PRN.   Metabolic acidosis with anion gap -give IV 1L bolus. Pt not likely to comply with continuous infusion   Paranoid schizophrenia -pt agitated and refusing exams.  Continue meds below order by psych. Need formal psychiatric consult in the morning.  -Pt under IVC by family. Needs 1:1 sitter monitoring. Benztropine 0.5mg  BID Haldol PO 5mg  BID  Trazodone 50mg  PRN ativan 2mg   q6hr PRN Zyprexa 10mg  TID       Advance Care Planning:   Code Status: Full Code   Consults: Needs formal psychiatric consult  Family Communication: No family at bedside  Severity of Illness: The appropriate patient status for this patient is OBSERVATION. Observation status is judged to be reasonable and necessary in order to provide the required intensity of service to ensure the patient's safety. The patient's presenting symptoms, physical exam findings, and initial radiographic and laboratory data in the context of their medical condition is felt to place them at decreased risk for further clinical deterioration. Furthermore, it is anticipated that the patient will be medically stable for discharge from the hospital within 2 midnights of admission.   Author: , DO 09/15/2021 9:21 PM  For on call review www. .

## 2021-09-15 NOTE — ED Provider Notes (Signed)
Emergency Medicine Observation Re-evaluation Note  [name] is a 61 y.o. female, seen on rounds today.  Pt initially presented to the ED for complaints of Psychiatric Evaluation Currently, the patient is refusing to eat.  Physical Exam  BP 115/74    Pulse 62    Temp 97.8 F (36.6 C) (Oral)    Resp 18    SpO2 100%  Physical Exam General: NAD Cardiac: well perfused Lungs: even and unlabored Psych: deferred  ED Course / MDM  EKG:   I have reviewed the labs performed to date as well as medications administered while in observation.  Recent changes in the last 24 hours include patient refusing all food and drink. She was hypoglycemic requiring D50 yesterday and continues to refuse to eat. She was hypoglycemic today with CBG of 52 and D50 was again administered. Spoke with Paula Abbe NP who mentioned that with her persistent hypoglycemia patient will likely need medical admission with psychiatry consultation. I reviewed the patient's labs: her Cr appears to be at baseline compared to 11 months ago. She has now two days in a row required resuscitation for hypoglycemia due to poor PO intake. Hospitalist medicine consulted for admission.  Plan  Current plan is for admit to medicine, psychiatry consulting. [name] is under involuntary commitment.      Paula Avena, MD 09/15/21 [phone]

## 2021-09-15 NOTE — ED Notes (Signed)
Pt refused her medication. Pt yelling at this nurse stating "mind your self."

## 2021-09-15 NOTE — Progress Notes (Signed)
Pt refused vitals signs and EKG

## 2021-09-15 NOTE — BH Assessment (Addendum)
Patient was seen by this writer to evaluate current mental health state. Patient is refusing all medications and per nurse has not eaten since her arrival on 2/24. Patient is refusing to interact with this Probation officer and is observed to be agitated as this Probation officer enters room. Per nurse patient has been actively hallucinating and has been responding to internal stimuli. Patient continues to meet inpatient criteria as bed placement is investigated per Romilda Garret NP.

## 2021-09-15 NOTE — ED Notes (Signed)
Pt did not want her breakfast or lunch tray.

## 2021-09-15 NOTE — ED Notes (Signed)
Belongings removed from locker and sent with patient.

## 2021-09-15 NOTE — ED Notes (Addendum)
Multiple staff members encouraged pt to eat and drink since she transferred to room 27 at 16:52 today.  Different drinks, foods, candies, and cookies were offered, all of which pt declined. Pt paces around the room and converses with people who are not in the room. Pt refusing medication. Pt refusing EKG. Pt yelling, "I don't want a EKG they fifthly. You are going overboard. This is medical neglect. I don't care what they going to make me do. No, you don't need a picture of my chest. I don't want to be there. This is bullshit. This is medical neglect. I have a right to refuse. Just leave me alone, please." Pt appears and sounds extremely agitated. Pt pacing around her room cursing and yelling.

## 2021-09-15 NOTE — ED Notes (Signed)
Still refusing labs and EKG

## 2021-09-15 NOTE — ED Notes (Addendum)
Pt refused to eat her lunch meal again also refused to drink anything RN aware

## 2021-09-15 NOTE — ED Notes (Signed)
Verbally combative, refuses care and treatment. However, when threatened with security she allowed meds to be given, CBG that she initially refused, lab draw and will allow EKG.

## 2021-09-15 NOTE — ED Notes (Signed)
Pt actively hallucinating. Pt is talking to Ms. Jimmey Ralph, public defender. Pt refusing all food and fluids.

## 2021-09-15 NOTE — ED Notes (Signed)
Continues to refuse labs, meds and EKG. Was advised that we are going to have to medicate her against her will.

## 2021-09-15 NOTE — ED Notes (Signed)
Ambulated to wheelchair for transport to the floor

## 2021-09-15 NOTE — ED Notes (Signed)
Patient refused shower and hygiene care ask by sitter x 3 times

## 2021-09-16 DIAGNOSIS — R339 Retention of urine, unspecified: Secondary | ICD-10-CM | POA: Diagnosis not present

## 2021-09-16 DIAGNOSIS — K5900 Constipation, unspecified: Secondary | ICD-10-CM | POA: Diagnosis not present

## 2021-09-16 DIAGNOSIS — Z888 Allergy status to other drugs, medicaments and biological substances status: Secondary | ICD-10-CM | POA: Diagnosis not present

## 2021-09-16 DIAGNOSIS — R410 Disorientation, unspecified: Secondary | ICD-10-CM | POA: Diagnosis not present

## 2021-09-16 DIAGNOSIS — Z4682 Encounter for fitting and adjustment of non-vascular catheter: Secondary | ICD-10-CM | POA: Diagnosis not present

## 2021-09-16 DIAGNOSIS — Z5320 Procedure and treatment not carried out because of patient's decision for unspecified reasons: Secondary | ICD-10-CM | POA: Diagnosis not present

## 2021-09-16 DIAGNOSIS — R9431 Abnormal electrocardiogram [ECG] [EKG]: Secondary | ICD-10-CM | POA: Diagnosis not present

## 2021-09-16 DIAGNOSIS — Z91199 Patient's noncompliance with other medical treatment and regimen due to unspecified reason: Secondary | ICD-10-CM | POA: Diagnosis not present

## 2021-09-16 DIAGNOSIS — R630 Anorexia: Secondary | ICD-10-CM | POA: Diagnosis not present

## 2021-09-16 DIAGNOSIS — R1310 Dysphagia, unspecified: Secondary | ICD-10-CM | POA: Diagnosis present

## 2021-09-16 DIAGNOSIS — D649 Anemia, unspecified: Secondary | ICD-10-CM | POA: Diagnosis not present

## 2021-09-16 DIAGNOSIS — Z781 Physical restraint status: Secondary | ICD-10-CM | POA: Diagnosis not present

## 2021-09-16 DIAGNOSIS — I959 Hypotension, unspecified: Secondary | ICD-10-CM | POA: Diagnosis not present

## 2021-09-16 DIAGNOSIS — F17210 Nicotine dependence, cigarettes, uncomplicated: Secondary | ICD-10-CM | POA: Diagnosis present

## 2021-09-16 DIAGNOSIS — F209 Schizophrenia, unspecified: Secondary | ICD-10-CM | POA: Diagnosis not present

## 2021-09-16 DIAGNOSIS — E871 Hypo-osmolality and hyponatremia: Secondary | ICD-10-CM | POA: Diagnosis not present

## 2021-09-16 DIAGNOSIS — Z9114 Patient's other noncompliance with medication regimen: Secondary | ICD-10-CM | POA: Diagnosis not present

## 2021-09-16 DIAGNOSIS — E872 Acidosis, unspecified: Secondary | ICD-10-CM

## 2021-09-16 DIAGNOSIS — F200 Paranoid schizophrenia: Secondary | ICD-10-CM | POA: Diagnosis present

## 2021-09-16 DIAGNOSIS — Z20822 Contact with and (suspected) exposure to covid-19: Secondary | ICD-10-CM | POA: Diagnosis present

## 2021-09-16 DIAGNOSIS — D638 Anemia in other chronic diseases classified elsewhere: Secondary | ICD-10-CM | POA: Diagnosis not present

## 2021-09-16 DIAGNOSIS — K2970 Gastritis, unspecified, without bleeding: Secondary | ICD-10-CM | POA: Diagnosis present

## 2021-09-16 DIAGNOSIS — Z886 Allergy status to analgesic agent status: Secondary | ICD-10-CM | POA: Diagnosis not present

## 2021-09-16 DIAGNOSIS — F061 Catatonic disorder due to known physiological condition: Secondary | ICD-10-CM | POA: Diagnosis not present

## 2021-09-16 DIAGNOSIS — E43 Unspecified severe protein-calorie malnutrition: Secondary | ICD-10-CM | POA: Diagnosis present

## 2021-09-16 DIAGNOSIS — K2950 Unspecified chronic gastritis without bleeding: Secondary | ICD-10-CM | POA: Diagnosis not present

## 2021-09-16 DIAGNOSIS — Z79899 Other long term (current) drug therapy: Secondary | ICD-10-CM | POA: Diagnosis not present

## 2021-09-16 DIAGNOSIS — E8809 Other disorders of plasma-protein metabolism, not elsewhere classified: Secondary | ICD-10-CM | POA: Diagnosis not present

## 2021-09-16 DIAGNOSIS — I472 Ventricular tachycardia, unspecified: Secondary | ICD-10-CM | POA: Diagnosis not present

## 2021-09-16 DIAGNOSIS — E861 Hypovolemia: Secondary | ICD-10-CM | POA: Diagnosis not present

## 2021-09-16 DIAGNOSIS — R4182 Altered mental status, unspecified: Secondary | ICD-10-CM | POA: Diagnosis present

## 2021-09-16 DIAGNOSIS — K319 Disease of stomach and duodenum, unspecified: Secondary | ICD-10-CM | POA: Diagnosis not present

## 2021-09-16 DIAGNOSIS — E878 Other disorders of electrolyte and fluid balance, not elsewhere classified: Secondary | ICD-10-CM | POA: Diagnosis not present

## 2021-09-16 DIAGNOSIS — E876 Hypokalemia: Secondary | ICD-10-CM | POA: Diagnosis present

## 2021-09-16 DIAGNOSIS — I9589 Other hypotension: Secondary | ICD-10-CM | POA: Diagnosis not present

## 2021-09-16 DIAGNOSIS — R001 Bradycardia, unspecified: Secondary | ICD-10-CM | POA: Diagnosis not present

## 2021-09-16 DIAGNOSIS — E162 Hypoglycemia, unspecified: Secondary | ICD-10-CM | POA: Diagnosis present

## 2021-09-16 DIAGNOSIS — Z681 Body mass index (BMI) 19 or less, adult: Secondary | ICD-10-CM | POA: Diagnosis not present

## 2021-09-16 LAB — GLUCOSE, CAPILLARY
Glucose-Capillary: 104 mg/dL — ABNORMAL HIGH (ref 70–99)
Glucose-Capillary: 115 mg/dL — ABNORMAL HIGH (ref 70–99)
Glucose-Capillary: 117 mg/dL — ABNORMAL HIGH (ref 70–99)
Glucose-Capillary: 207 mg/dL — ABNORMAL HIGH (ref 70–99)
Glucose-Capillary: 56 mg/dL — ABNORMAL LOW (ref 70–99)
Glucose-Capillary: 62 mg/dL — ABNORMAL LOW (ref 70–99)
Glucose-Capillary: 67 mg/dL — ABNORMAL LOW (ref 70–99)
Glucose-Capillary: 70 mg/dL (ref 70–99)
Glucose-Capillary: 79 mg/dL (ref 70–99)
Glucose-Capillary: 87 mg/dL (ref 70–99)
Glucose-Capillary: 90 mg/dL (ref 70–99)

## 2021-09-16 MED ORDER — MEGESTROL ACETATE 20 MG PO TABS
20.0000 mg | ORAL_TABLET | Freq: Two times a day (BID) | ORAL | Status: DC
  Administered 2021-09-16 – 2021-09-22 (×7): 20 mg via ORAL
  Filled 2021-09-16 (×15): qty 1

## 2021-09-16 MED ORDER — HALOPERIDOL LACTATE 5 MG/ML IJ SOLN
5.0000 mg | Freq: Every day | INTRAMUSCULAR | Status: AC
  Administered 2021-09-18: 5 mg via INTRAMUSCULAR
  Filled 2021-09-16 (×2): qty 1

## 2021-09-16 MED ORDER — HALOPERIDOL 5 MG PO TABS
5.0000 mg | ORAL_TABLET | Freq: Every day | ORAL | Status: AC
  Administered 2021-09-16 – 2021-09-22 (×5): 5 mg via ORAL
  Filled 2021-09-16 (×7): qty 1

## 2021-09-16 MED ORDER — ENSURE ENLIVE PO LIQD: 237.0000 mL | Freq: Two times a day (BID) | ORAL | Status: DC

## 2021-09-16 MED ORDER — DEXTROSE 50 % IV SOLN
1.0000 | INTRAVENOUS | Status: DC | PRN
  Administered 2021-09-16: 50 mL via INTRAVENOUS
  Filled 2021-09-16: qty 50

## 2021-09-16 MED ORDER — DEXTROSE 50 % IV SOLN
1.0000 | Freq: Once | INTRAVENOUS | Status: DC
Start: 2021-09-16 — End: 2021-09-16

## 2021-09-16 MED ORDER — BENZTROPINE MESYLATE 0.5 MG PO TABS: 1.0000 mg | ORAL_TABLET | Freq: Two times a day (BID) | ORAL | Status: DC | PRN

## 2021-09-16 MED ORDER — HALOPERIDOL 5 MG PO TABS
5.0000 mg | ORAL_TABLET | Freq: Every day | ORAL | Status: DC
  Filled 2021-09-16: qty 1

## 2021-09-16 MED ORDER — LACTATED RINGERS IV BOLUS
1000.0000 mL | Freq: Once | INTRAVENOUS | Status: AC
  Administered 2021-09-16: 1000 mL via INTRAVENOUS

## 2021-09-16 MED ORDER — DEXTROSE 5 % IV SOLN: INTRAVENOUS | Status: DC

## 2021-09-16 MED ORDER — DEXTROSE 50 % IV SOLN
12.5000 g | INTRAVENOUS | Status: AC
  Administered 2021-09-16: 12.5 g via INTRAVENOUS
  Filled 2021-09-16: qty 50

## 2021-09-16 MED ORDER — HALOPERIDOL LACTATE 5 MG/ML IJ SOLN: 5.0000 mg | Freq: Every day | INTRAMUSCULAR | Status: DC

## 2021-09-16 NOTE — Assessment & Plan Note (Addendum)
Patient was brought to the hospital under IVC by legal guardian, sister due to paranoia and delusion.  Hypoglycemia due to refusal of oral intake while in ED requiring admission..Body mass index is 15.89 kg/m.  Continues to refuse food and water.  Has been refusing other interventions as well.  Prealbumin 7.6. Evaluated by psych and deemed to lack capacity and met criteria for medication over objection and inpatient psychiatric hospitalization.  Legal guardian provided consent for treatment and intervention including restraints and NGT placement for feeding.. -IV line replaced with restraints on 3/3.  -Continue D5-NS-KCl given hyponatremia and hypokalemia -Continue restraints to maintain IV line and IV medications -Bedside NG tube placement by ICU RN unsuccessful.   -General surgery, [facility] and IR deferred NGT placement to radiology under fluoro -Fluoro radiology concern about placing NGT in uncooperative patient under soft restraints -Hospital CMO and ethics consulted, and IR reengaged but deferred to other experts -[facility] consulted.  Plan for EGD and NGT on 3/9.  Add hand control double security posey mitts to restraints once NGT placed. -Psych following-long-acting Haldol injection on 3/3.  Added Klonopin.  Continue p.o. Haldol -Continue IV thiamine and Megace -Dietitian recommendation Nutrition Problem: Inadequate oral intake Etiology: chronic illness (paranoid schizophrenia) Signs/Symptoms: meal completion < 25% (refusing PO intakes) Interventions: Ensure Enlive (each supplement provides 350kcal and 20 grams of protein), Boost Breeze, MVI

## 2021-09-16 NOTE — Progress Notes (Signed)
PROGRESS NOTE  Blodwyn Elsayed ZOX:[id number] DOB: 1961-06-06 DOA: 09/12/2021 PCP: [facility]  Brief History    [name] is a 61 y.o. female with medical history significant of schizophrenia who was brought into the ED on 2/24 with IVC from family indicating that she has not been taking her medications, hearing voices and having hallucination and delusional thoughts.   Patient has been in the ER awaiting psychiatric inpatient bed placement.  However has been refusing food for the past 3 days.  In the past 24 hours has become more hypoglycemic requiring multiple doses of D50.  Hospitalist was called for medical admission and management of hypoglycemia.   She has been placed on D5 at 100 cc/hr.  Consultants  Psychiatry  Procedures  None  Antibiotics   Anti-infectives (From admission, onward)    None      Subjective  The patient is resting comfortably. She is minimally cooperate with exam.  Objective   Vitals:  Vitals:   09/16/21 0320 09/16/21 0754  BP: 113/70 104/76  Pulse: (!) 44 69  Resp: 14 18  Temp: (!) 97.4 F (36.3 C) 97.8 F (36.6 C)  SpO2: 100% 99%    Exam:  Constitutional:  The patient is awake and alert. Minimally cooperative. Irascible. No acute distress. Respiratory:  No increased work of breathing. No wheezes, rales, or rhonchi No tactile fremitus Cardiovascular:  Regular rate and rhythm No murmurs, ectopy, or gallups. No lateral PMI. No thrills. Abdomen:  Abdomen is soft, non-tender, non-distended No hernias, masses, or organomegaly Normoactive bowel sounds.  Musculoskeletal:  No cyanosis, clubbing, or edema Skin:  No rashes, lesions, ulcers palpation of skin: no induration or nodules Neurologic:  CN 2-12 intact Sensation all 4 extremities intact Psychiatric:  Mental status Mood, affect appropriate Orientation to person, place, time  judgment and insight appear intact   I have personally reviewed the following:   Today's  Data  Vitals  Lab Data  BMP  Micro Data    Imaging    Cardiology Data    Other Data    Scheduled Meds:  feeding supplement  237 mL Oral BID BM   haloperidol  5 mg Oral BID   haloperidol  5 mg Oral Daily   Or   haloperidol lactate  5 mg Intramuscular Daily   megestrol  20 mg Oral BID   sodium chloride flush  3 mL Intravenous Q12H   Continuous Infusions:  sodium chloride     dextrose 100 mL/hr at 09/16/21 8119    Principal Problem:   Hypoglycemia Active Problems:   Schizophrenia, paranoid (HCC)   Schizophrenia (HCC)   Metabolic acidosis   LOS: 0 days   A & P  Assessment and Plan: * Hypoglycemia- (present on admission) The patient is refusing to eat. She had several ongoing issues of hypoglycemia that were treated with D50. She is now on D5 at 100 cc/hr. Monitor.  Metabolic acidosis Follow. Treat with IV fluids.   Schizophrenia, paranoid (HCC)- (present on admission) Continue Cogentin and Haldol.  I have seen and examined this patient myself. I have spent 32 minutes in her evaluation and care.  DVT prophylaxis: Low risk Code Status: Full Code Family Communication: None available Disposition Plan: Inpatient psychiatric treatment    Etienne Mowers, DO [facility] Direct contact: see [email or web address]  7PM-7AM contact night coverage as above 09/16/2021, 3:22 PM  LOS: 0 days

## 2021-09-16 NOTE — Assessment & Plan Note (Addendum)
Patient brought to the hospital under IVC by family members.  IVC renewed on 3/3.  Deemed to lack capacity and met criteria for medication over objection and inpatient psychiatric hospitalization by psychiatry. -Had long-acting Haldol injection on 3/3.  She is also on IM/p.o. Haldol -Psych added Klonopin. -Cogentin and Ativan as needed -Continue soft restraints. -Follow further recommendation by psych.

## 2021-09-16 NOTE — Consult Note (Signed)
[name], 61 y.o., female patient presented to [facility] under IVC on 09/12/21 by her guardian with complaints of being a danger to self and others.   Patient seen via face to face by this provider. Patient presents loud, with pressured speech and continues to remain psychotic at this time. She is minimizing all her behaviors and states t"I am not paranoid nothing is wrong with me. All I want to talk about is discharge. Let me go home. " She immediately began yelling and cursing, and feels that we are infiltrating her head. She does not make eye contact but continues to fidget at her sheets and picking at her sheets. She is very familiar to [place] here and presents with paranoia usually related to not taking her psychotropic medications. Subsequently her paranoia has impacted her eating habits, and she has been refusing to eat throughout her 4 day length of stay. Patient was admitted to the medical unit for hypoglycemia and failure to thrive.    During evaluation [name] is alert and oriented to self and place.  Patient uncooperative through most of interview and she was irritated and yelling during whole assessment.  Patient continues to be paranoid and delusional referecing the government.  Unable to determine if patient is responding to internal or external stimuli; but patient does appear to have paranoid ideation.  Patient denies suicidal/homicidal ideation, psychosis, and paranoia.    Patient with a history of schizophrenia, weight loss, refusal to eat secondary to paranoia.  Patient does appear to respond well when Haldol may case.  When started on Megace to help increase her appetite, she does begin to eat.  Psych team will continue to work closely to help resolve paranoia and psychosis, while encouraging increase intake of oral foods and drinks/water.    Due to the patient's current physical limitations secondary to her paranoia and psychosis to include refusing to eat, she will require nonemergency  forced medication.  Please see documentation from Dr. Catalina [name] and [name], who agree on forced med order.   SHe continues to show the tendency to decompensate quickly and has required force meds for psychiatric stabilization. Patient has shown signs of deterioration while being held involuntarily over several days/weeks in the emergency room, primarily due to her refusal to eat.    -Patient meet criteria for forced medication orders; outlined in [place].    NEFM:  Haldol 5 mg p.o. daily or Haldol 5 mg IM if patient refuses oral haloperidol.  Due to lack of oral intake, patient is high risk to  develop dystonia and other EPS symptoms that may result in restlessness, agitation, and worsening of clinical presentation. Please limit use of (2) more antipsychotics at this time.  Goal is to increase haldol dose and transition to long acting injection, as she has done well with this in the past.    -Will start Megace 20 mg p.o. twice daily for cachexia and poor appetite.   -SHe continues to remain a danger to himself and others will continue with involuntary commitment at this time.  -Will continue to require inpatient psychiatric hospitalization for stabilization.  -Psychiatry will continue to follow at this time. Disposition: Recommend psychiatric Inpatient admission when medically cleared.  Paula Fava, DNP, PMHNP-BC

## 2021-09-16 NOTE — Plan of Care (Signed)

## 2021-09-16 NOTE — Assessment & Plan Note (Deleted)
Continue Cogentin and Haldol

## 2021-09-16 NOTE — Assessment & Plan Note (Addendum)
Resolved

## 2021-09-16 NOTE — Consult Note (Addendum)
Brief Psychiatry Consult Note  I was asked to come see this pt by NP Caryn Bee to consider for non-emergent forced medications in accordance with [place] statute which requires assessment by multiple physicians. On my assessment pt did not engage in conversation and rambled for several minutes about government interference in her life. She did not answer questions directly posed to her; eventually terminated interview as pt became more agitated.   Patient suffers from a significant mental illness (schizophrenia) which is likely to result in death by starvation if untreated; pt has refused all PO intake for the last several days due to the false belief that her food has been tampered with - has required IV dextrose to maintain blood sugar compatible with life. There is evidence of significant and prolonged deterioration over the past year since her last discharge from [facility]; she was petitioned by her legal guardian Teacher, [name] [phone]) on 2/24. She has responded well in the past to a combination of haloperidol and olanzapine (suggesting some element of treatment resistance rather than solely noncompliance).   She has been offered haloperidol and generally and refused this medications due to general paranoid beliefs (did take dose this AM and yesterday night He meets criteria for involuntary forced medication as outlined in 10A NCAC 26D .1104. Dr. Gerri Lins  has also reviewed the case and is providing the second opinion for non-emergent forced medications (see their note from today).    - haloperidol 5 mg PO followed by IM if pt refuses PO medication  - cogentin 1 mg PO/IM for signs of dystonia/muscle rigidity (left this PRN as unclear if this is necessary and prefer to avoid anticholinergic medications where possible)  - this medication order is good for (7) days.   In future, can get in touch with legal guardian for medication updates to bypass necessity for 2 physician recommendation.    [name]

## 2021-09-16 NOTE — Progress Notes (Signed)
Pt alert and oriented to self. Blood glucose monitored q 4 hrs. Pt refuses to drink or eat anything. Glucagon given for glucose of 67 and recheck 204. Sitter at bedside. Pt agitated but easy to redirect. Will continue plan of care.

## 2021-09-16 NOTE — Progress Notes (Signed)
NEFM Statement  Reason for the Medication: The patient, without the benefit of the specific treatment measure, is incapable of participating in any available treatment plan that will give the patient a realistic opportunity of improving the patient's condition.   Consideration of Side Effects: Consideration of the side effects related to the medication plan has been given.   Rationale for Medication Administration:    Patient is a 61 year old female, who continues to be psychotic, needs inpatient psychiatric care.  Patient continues to refuse all labs, medicine, and has not eaten in 72 hours or more . She continues to agitated, aggressive, is delusional, hallucinating. Patient needs medications to help stabilize her for safety of self and others as patient is currently psychotic.   She has been in offered olanzapine, Cogentin, Ativan and continues to refuse these medicines that he meets criteria for involuntary forced medication as outlined in 10a ncac 26d .1104.  Dr. Gasper Sells has also reviewed the case and is providing the second opinion for nonemergent forced medications. Fran Lowes, MD

## 2021-09-17 DIAGNOSIS — E162 Hypoglycemia, unspecified: Secondary | ICD-10-CM | POA: Diagnosis not present

## 2021-09-17 DIAGNOSIS — E43 Unspecified severe protein-calorie malnutrition: Secondary | ICD-10-CM | POA: Diagnosis not present

## 2021-09-17 DIAGNOSIS — E876 Hypokalemia: Secondary | ICD-10-CM | POA: Diagnosis not present

## 2021-09-17 DIAGNOSIS — F200 Paranoid schizophrenia: Secondary | ICD-10-CM | POA: Diagnosis not present

## 2021-09-17 LAB — BASIC METABOLIC PANEL
Anion gap: 6 (ref 5–15)
BUN: 7 mg/dL (ref 6–20)
CO2: 25 mmol/L (ref 22–32)
Calcium: 8.9 mg/dL (ref 8.9–10.3)
Chloride: 105 mmol/L (ref 98–111)
Creatinine, Ser: 0.75 mg/dL (ref 0.44–1.00)
GFR, Estimated: >60 mL/min (ref >60)
Glucose, Bld: 97 mg/dL (ref 70–99)
Potassium: 3.4 mmol/L — ABNORMAL LOW (ref 3.5–5.1)
Sodium: 136 mmol/L (ref 135–145)

## 2021-09-17 LAB — CBC WITH DIFFERENTIAL/PLATELET
Abs Immature Granulocytes: 0.01 10*3/uL (ref 0.00–0.07)
Basophils Absolute: 0 10*3/uL (ref 0.0–0.1)
Basophils Relative: 1 %
Eosinophils Absolute: 0.1 10*3/uL (ref 0.0–0.5)
Eosinophils Relative: 3 %
HCT: 40.4 % (ref 36.0–46.0)
Hemoglobin: 13.4 g/dL (ref 12.0–15.0)
Immature Granulocytes: 0 %
Lymphocytes Relative: 24 %
Lymphs Abs: 1 10*3/uL (ref 0.7–4.0)
MCH: 28.5 pg (ref 26.0–34.0)
MCHC: 33.2 g/dL (ref 30.0–36.0)
MCV: 85.8 fL (ref 80.0–100.0)
Monocytes Absolute: 0.4 10*3/uL (ref 0.1–1.0)
Monocytes Relative: 8 %
Neutro Abs: 2.9 10*3/uL (ref 1.7–7.7)
Neutrophils Relative %: 64 %
Platelets: 178 10*3/uL (ref 150–400)
RBC: 4.71 MIL/uL (ref 3.87–5.11)
RDW: 12.6 % (ref 11.5–15.5)
WBC: 4.4 10*3/uL (ref 4.0–10.5)
nRBC: 0 % (ref 0.0–0.2)

## 2021-09-17 LAB — GLUCOSE, CAPILLARY
Glucose-Capillary: 102 mg/dL — ABNORMAL HIGH (ref 70–99)
Glucose-Capillary: 113 mg/dL — ABNORMAL HIGH (ref 70–99)
Glucose-Capillary: 107 mg/dL — ABNORMAL HIGH (ref 70–99)
Glucose-Capillary: 116 mg/dL — ABNORMAL HIGH (ref 70–99)
Glucose-Capillary: 81 mg/dL (ref 70–99)
Glucose-Capillary: 80 mg/dL (ref 70–99)

## 2021-09-17 LAB — MAGNESIUM: Magnesium: 2 mg/dL (ref 1.7–2.4)

## 2021-09-17 MED ORDER — ENOXAPARIN SODIUM 30 MG/0.3ML IJ SOSY
30.0000 mg | PREFILLED_SYRINGE | Freq: Every day | INTRAMUSCULAR | Status: DC
  Administered 2021-09-19 – 2021-10-01 (×8): 30 mg via SUBCUTANEOUS
  Filled 2021-09-17 (×12): qty 0.3

## 2021-09-17 MED ORDER — ENOXAPARIN SODIUM 40 MG/0.4ML IJ SOSY
40.0000 mg | PREFILLED_SYRINGE | Freq: Every day | INTRAMUSCULAR | Status: DC
  Administered 2021-09-17: 40 mg via SUBCUTANEOUS
  Filled 2021-09-17: qty 0.4

## 2021-09-17 MED ORDER — ADULT MULTIVITAMIN W/MINERALS CH
1.0000 | ORAL_TABLET | Freq: Every day | ORAL | Status: DC
  Administered 2021-09-22 – 2021-09-24 (×2): 1 via ORAL
  Filled 2021-09-17 (×5): qty 1

## 2021-09-17 MED ORDER — POTASSIUM CHLORIDE CRYS ER 20 MEQ PO TBCR
40.0000 meq | EXTENDED_RELEASE_TABLET | ORAL | Status: AC
  Filled 2021-09-17 (×2): qty 2

## 2021-09-17 MED ORDER — BOOST / RESOURCE BREEZE PO LIQD CUSTOM: 1.0000 | Freq: Three times a day (TID) | ORAL | Status: DC

## 2021-09-17 NOTE — Hospital Course (Addendum)
61 year old F with PMH of paranoid schizophrenia who was presented to ED on 09/12/2021 under IVC from family due to not taking her meds, auditory hallucination and delusional thoughts.  She was in ED waiting on inpatient psychiatric hospitalization but became hypoglycemic due to refusal to eat or drink, and admitted for hypoglycemia.  She was started on D5 infusion but removing IV lines and IV replacement and oral intake.   ? ?Patient is evaluated by psychiatry.  She was deemed to lack capacity.  Legal guardian, sister consenting for treatment and interventions.   ?Patient was started on Haldol without significant improvement in her paranoia.  Continued to refuse IV placement and oral intake.  Only able to administer IM glucagon for recurrent hypoglycemia.  The decision was made for nasogastric feeding tube and soft restraints on 09/19/2021 after discussion with legal guardian. Unfortunately, ICU RN not able to advance small bore NGT.  General surgery, [facility] and IR recommended consulting radiology for fluoroscopy guided NG tube placement by radiology.  Radiology deferred NG tube placement on this patient with restraints, and recommended placement under sedation or general anesthesia.  Hospital CMO and ethics involved. Finally, [facility] consulted. Plan for EGD on 3/9. GI to attempt NGT during EGD. ?

## 2021-09-17 NOTE — Progress Notes (Signed)
Patient has loss IV assess for the second time on this shift, RN placed a 22g IV in left hand at beginning of shift which was removed by patient.  RN attempted to place another IV and attempt was unsuccessful. RN placed order for IV team consult. IV team nurse informed RN that patient refused IV placement. RN attempted to educate patient on importance of IV due to needing fluids to help prevent hypoglycemic episodes. Due to patient's current mental state there was no evidence of learning. RN informed IV nurse that another consult would be placed within the hour for IV insertion once patient is less agitated and more willing to accept IV placement.Safety sitter at bedside.Last CBG WNL 113. Patient is alert, speech is normal, pupils are normal, no signs or symptoms of distress or hypoglycemia at this time.  ?

## 2021-09-17 NOTE — Consult Note (Signed)
[name], 61 y.o., female patient presented to [name] under IVC on 09/12/21 by her guardian with complaints of being a danger to self and others.   Patient seen via face to face by this provider. Patient presents irritable, with pressured speech at times although she is more cooperative today on exam. She continues to display paranoia and precursatory delusions. At present she does not trust any one, family members included and will not eat anything. She is begging to go home, and while we did discuss what things it will take to get her home. She lacks capacity, insight and judgement to understand her schizophrenia is affecting her physical health at this time which is requiring hospitalization.  ? ?During evaluation [name] is alert and oriented to self and place.  Patient cooperative through most of interview and she was irritated.  Patient continues to be paranoid and delusional referecing the family preying on her.  Patient does not appear to be responding to internal or external stimuli.  Patient denies suicidal/homicidal ideation, psychosis, and paranoia.  ? ? Patient with a history of schizophrenia, weight loss, refusal to eat secondary to paranoia.  Patient does appear to respond well when Haldol and megace.  When started on Megace to help increase her appetite, she does begin to eat.  Psych team will continue to work closely to help resolve paranoia and psychosis, while encouraging increase intake of oral foods and drinks/water.   ? ? SHe continues to show the tendency to decompensate quickly and has required force meds for psychiatric stabilization. Patient has shown signs of deterioration while being held involuntarily over several days/weeks in the emergency room, primarily due to her refusal to eat.  ?  ?-Patient meet criteria for forced medication orders; outlined in 10A NCAC 26D.1104.  ?  ?NEFM:  ?Haldol 5 mg p.o. daily or Haldol 5 mg IM if patient refuses oral haloperidol. ? ?Due to lack of oral  intake, patient is high risk to  develop dystonia and other EPS symptoms that may result in restlessness, agitation, and worsening of clinical presentation. Please limit use of (2) more antipsychotics at this time.  ? ?Goal is to increase haldol dose and transition to long acting injection, as she has done well with this in the past.  ?  ?-Will start Megace 20 mg p.o. twice daily for cachexia and poor appetite.  ? ?-Continue with nutritionist recommendations, and encourage patient to eat throughout the day. She may be irritable but encourage her small snacks and meals throughout. Any oral intake is better than none.  ? ?-SHe continues to remain a danger to himself and others will continue with involuntary commitment at this time. ? ?-Will continue to require inpatient psychiatric hospitalization for stabilization. ? ?-Psychiatry will continue to follow at this time. ? ?Disposition: ?Recommend psychiatric Inpatient admission when medically cleared. ? ?Caryn Bee, DNP, PMHNP-BC ? ? ? ?

## 2021-09-17 NOTE — Progress Notes (Signed)
Patient continues to refuse to allow staff to attempt IV access. Patient states "I'm through with that. I'm arm is sore as the dickens." ?

## 2021-09-17 NOTE — Assessment & Plan Note (Deleted)
As evidenced by significant muscle mass and subcu fat loss, low BMI and refusal of p.o. intake and hypoglycemia ?-Management as above. ?Body mass index is 16.04 kg/m?Marland Kitchen ?Nutrition Problem: Inadequate oral intake ?Etiology: chronic illness (paranoid schizophrenia) ?Signs/Symptoms: meal completion < 25% (refusing PO intakes) ?Interventions: Ensure Enlive (each supplement provides 350kcal and 20 grams of protein), Boost Breeze, MVI ?

## 2021-09-17 NOTE — Assessment & Plan Note (Addendum)
Resolved. ?-Continue monitoring ?-Needs close monitoring once tube feed placed and she starts TF ?

## 2021-09-17 NOTE — Progress Notes (Addendum)
Initial Nutrition Assessment ? ?DOCUMENTATION CODES:  ? ?Underweight ? ?INTERVENTION:  ? ?Recommend monitor magnesium, potassium, and phosphorus BID, MD to replete as needed, as pt is at risk for refeeding syndrome. ? ?-Ensure Plus High Protein po BID, each supplement provides 350 kcal and 20 grams of protein. ? ?-Boost Breeze po TID, each supplement provides 250 kcal and 9 grams of protein ? ?-Multivitamin with minerals daily ? ?If tube feeding pursued: ?-Initiate Osmolite 1.2 @ 20 ml/hr, advance by 10 ml every 24 hours to goal of 70 ml/hr. ?Provides 2016 kcals, 93g protein and 1377 ml H2O ? ?-If patient becomes more compliant, would recommend checking vitamin labs : Thiamine, iron, folate, Vitamin B-12, Vitamin D ? ?NUTRITION DIAGNOSIS:  ? ?Inadequate oral intake related to chronic illness (paranoid schizophrenia) as evidenced by meal completion < 25% (refusing PO intakes). ? ?GOAL:  ? ?Patient will meet greater than or equal to 90% of their needs ? ?MONITOR:  ? ?PO intake, Supplement acceptance, Labs, Weight trends, I & O's ? ?REASON FOR ASSESSMENT:  ? ?Malnutrition Screening Tool ?  ? ?ASSESSMENT:  ? ?61 y.o. female with medical history significant of schizophrenia who was brought into the ED on 2/24 with IVC from family indicating that she has not been taking her medications, hearing voices and having hallucination and delusional thoughts. ? ?Patient in room, tech at bedside  for safety. Pt sitting on edge of bed. When RD introduced herself, pt states "I don't want any of that". RD asked if pt was willing to drink any Ensure or clear liquid supplements. Pt states "no". RD will check back with pt to encourage POs.  ? ?Per chart review, pt has not eaten for 72 hours.  ?Spoke with RN in hall. Pt has been refusing meals, water and medications. She did take her medications after receiving a dose of ativan but she took them dry without any fluids. Reports there is a consideration of starting tube feedings with  patient. Will monitor for if this is the plan.  ? ?Pt will be at high refeeding risk. Likely has vitamin/mineral deficiencies. History of folate deficiency in 2021. If becomes more stable and compliant with likely need to have vitamin labs checked. Will order MVI in the meantime. Will also go ahead and order Boost Breeze as pt has accepted this in the past.  ? ?Per weight records, pt has lost 16 lbs since 10/05/20 (14% wt loss x 11.5 months, insignificant for time frame). Concerning given history of malnutrition. Suspect malnutrition continues.  ? ?Medications: Megace, D5 infusion @ 100 (providing 408 kcals) ? ?Labs reviewed: ?CBGs: 87-116 ?Low K ? ?NUTRITION - FOCUSED PHYSICAL EXAM: ? ?Unable to complete, pt not cooperative and refusing to speak with RD. ? ?Diet Order:   ?Diet Order   ? ?       ?  Diet regular Room service appropriate? Yes; Fluid consistency: Thin  Diet effective now       ?  ? ?  ?  ? ?  ? ? ?EDUCATION NEEDS:  ? ?Not appropriate for education at this time ? ?Skin:  Skin Assessment: Reviewed RN Assessment ? ?Last BM:  PTA ? ?Height:  ? ?Ht Readings from Last 1 Encounters:  ?09/15/21 5\' 4"  (1.626 m)  ? ? ?Weight:  ? ?Wt Readings from Last 1 Encounters:  ?09/15/21 42.4 kg  ? ? ?BMI:  Body mass index is 16.04 kg/m?. ? ?Estimated Nutritional Needs:  ? ?Kcal:  1900-2100 ? ?Protein:  100-115g ? ?  Fluid:  2L/day ? ?Tilda Franco, MS, RD, LDN ?Inpatient Clinical Dietitian ?Contact information available via Amion ? ?

## 2021-09-17 NOTE — Progress Notes (Signed)
PROGRESS NOTE  [name] ZOX:[id number] DOB: 1960-11-29   PCP: [facility]  Patient is from: Home  DOA: 09/12/2021 LOS: 1  Chief complaints:  Chief Complaint  Patient presents with   Psychiatric Evaluation     Brief Narrative / Interim history: 61 year old F with PMH of paranoid schizophrenia who was presented to ED on 09/12/2021 under IVC from family due to not taking her meds, auditory hallucination and delusional thoughts.  She was in ED waiting on inpatient psychiatric hospitalization but became hypoglycemic due to refusal to eat or drink, and admitted for hypoglycemia.  She was started on D5 infusion but removing IV lines and refusing care and oral intake.  Psychiatry following.   Subjective: Seen and examined earlier this morning.  Patient removed IV access overnight.  She says she wants to go home.  She said there is some conspiracy.  She does not want to eat or take medications.  She took a Haldol last night.  Received her magace at this morning.  Objective: Vitals:   09/16/21 2027 09/17/21 0545 09/17/21 0605 09/17/21 1358  BP: 105/64 101/69  97/73  Pulse: 60 (!) 49 62 79  Resp: 17 14  18   Temp: 98.3 F (36.8 C) (!) 97.4 F (36.3 C)  98.4 F (36.9 C)  TempSrc: Oral Oral  Oral  SpO2: 98% 99%  97%  Weight:      Height:        Examination:  GENERAL: Frail looking female.  No apparent distress. HEENT: MMM.  Vision and hearing grossly intact.  RESP: On RA.  No IWOB. CVS: Vital signs normal.  Patient did not allow me to examine ABD/GI/GU: BS+. Abd soft, NTND.  MSK/EXT:  Moves extremities.  Significant muscle mass and subcu fat loss. SKIN: no apparent skin lesion or wound NEURO: Awake and alert.  No apparent focal neuro deficit. PSYCH: Rambling speech.  Talks about conspiracy  Procedures:  None  Microbiology summarized: COVID-19 and influenza PCR nonreactive.  Assessment and Plan: * Hypoglycemia- (present on admission) Due to refusal to eat in the  setting of schizophrenia.  She was started on D5 infusion but removed her IV lines.  Per psychiatry, she requires nonemergency force and medication.  -May require tube feed for feeding and meds but I afraid she will pull it out with ongoing psychosis and paranoia.  Discussed with patient's sister, legal gardian who is planning to visit with mother.  -Monitor CBG -Daily weight  Protein-calorie malnutrition, severe (HCC)- (present on admission) As evidenced by significant muscle mass and subcu fat loss, low BMI and refusal of p.o. intake Body mass index is 16.04 kg/m. -Started on Megace by psychiatry. -Management as above. Nutrition Problem: Inadequate oral intake Etiology: chronic illness (paranoid schizophrenia) Signs/Symptoms: meal completion < 25% (refusing PO intakes) Interventions: Ensure Enlive (each supplement provides 350kcal and 20 grams of protein), Boost Breeze, MVI  Schizophrenia, paranoid (HCC)- (present on admission) Continues to be paranoid.  Refuses care including meds and food.  -Psychiatry following  -Patient is under IVC by family members -On Haldol 5 mg nightly -On Ativan 2 mg every 6 hours as needed -Cogentin as needed -Psych recommended inpatient psych hospitalization -Follow further recommendation by psych.   Metabolic acidosis Seems to have resolved.  Could be starvation ketosis.  Hypokalemia- (present on admission) K3.4. -P.o. KCl 40x2 -Check magnesium   DVT prophylaxis:  enoxaparin (LOVENOX) injection 40 mg Start: 09/17/21 1500  Code Status: Full code Family Communication: Updated patient's sister, legal guardian over  the phone. Level of care: Med-Surg Status is: Inpatient Remains inpatient appropriate because: Hypoglycemia and schizophrenia     Final disposition: Inpatient psychiatric hospital      Consultants:  Psychiatry   Sch Meds:  Scheduled Meds:  enoxaparin (LOVENOX) injection  40 mg Subcutaneous Q24H   feeding supplement  1  Container Oral TID BM   feeding supplement  237 mL Oral BID BM   haloperidol  5 mg Oral QHS   Or   haloperidol lactate  5 mg Intramuscular QHS   megestrol  20 mg Oral BID   multivitamin with minerals  1 tablet Oral Daily   potassium chloride  40 mEq Oral Q4H   sodium chloride flush  3 mL Intravenous Q12H   Continuous Infusions:  sodium chloride     dextrose 100 mL/hr at 09/16/21 1906   PRN Meds:.sodium chloride, benztropine **OR** benztropine mesylate, dextrose, LORazepam **OR** LORazepam, sodium chloride flush, traZODone  Antimicrobials: Anti-infectives (From admission, onward)    None        I have personally reviewed the following labs and images: CBC: Recent Labs  Lab 09/12/21 1805 09/17/21 0518  WBC 5.9 4.4  NEUTROABS  --  2.9  HGB 12.3 13.4  HCT 38.1 40.4  MCV 87.6 85.8  PLT 173 178   BMP &GFR Recent Labs  Lab 09/12/21 1805 09/15/21 2130 09/17/21 0518  NA 140 137 136  K 3.5 3.7 3.4*  CL 108 106 105  CO2 24 15* 25  GLUCOSE 104* 95 97  BUN 30* 13 7  CREATININE 1.18* 0.82 0.75  CALCIUM 8.9 9.6 8.9  MG  --   --  2.0   Estimated Creatinine Clearance: 50.1 mL/min (by C-G formula based on SCr of 0.75 mg/dL). Liver & Pancreas: Recent Labs  Lab 09/12/21 1805  AST 32  ALT 16  ALKPHOS 54  BILITOT 0.2*  PROT 7.2  ALBUMIN 4.4   No results for input(s): LIPASE, AMYLASE in the last 168 hours. No results for input(s): AMMONIA in the last 168 hours. Diabetic: No results for input(s): HGBA1C in the last 72 hours. Recent Labs  Lab 09/16/21 2351 09/17/21 0159 09/17/21 0400 09/17/21 0734 09/17/21 1144  GLUCAP 87 102* 113* 107* 116*   Cardiac Enzymes: No results for input(s): CKTOTAL, CKMB, CKMBINDEX, TROPONINI in the last 168 hours. No results for input(s): PROBNP in the last 8760 hours. Coagulation Profile: No results for input(s): INR, PROTIME in the last 168 hours. Thyroid Function Tests: No results for input(s): TSH, T4TOTAL, FREET4, T3FREE,  THYROIDAB in the last 72 hours. Lipid Profile: No results for input(s): CHOL, HDL, LDLCALC, TRIG, CHOLHDL, LDLDIRECT in the last 72 hours. Anemia Panel: No results for input(s): VITAMINB12, FOLATE, FERRITIN, TIBC, IRON, RETICCTPCT in the last 72 hours. Urine analysis:    Component Value Date/Time   COLORURINE STRAW (A) 03/25/2020 1608   APPEARANCEUR CLEAR 03/25/2020 1608   LABSPEC 1.004 (L) 03/25/2020 1608   PHURINE 6.0 03/25/2020 1608   GLUCOSEU NEGATIVE 03/25/2020 1608   HGBUR SMALL (A) 03/25/2020 1608   BILIRUBINUR NEGATIVE 03/25/2020 1608   KETONESUR 5 (A) 03/25/2020 1608   PROTEINUR NEGATIVE 03/25/2020 1608   UROBILINOGEN 0.2 04/10/2015 1111   NITRITE NEGATIVE 03/25/2020 1608   LEUKOCYTESUR NEGATIVE 03/25/2020 1608   Sepsis Labs: Invalid input(s): PROCALCITONIN, LACTICIDVEN  Microbiology: Recent Results (from the past 240 hour(s))  Resp Panel by RT-PCR (Flu A&B, Covid) Nasopharyngeal Swab     Status: None   Collection Time: 09/12/21  6:05 PM  Specimen: Nasopharyngeal Swab; Nasopharyngeal(NP) swabs in vial transport medium  Result Value Ref Range Status   SARS Coronavirus 2 by RT PCR NEGATIVE NEGATIVE Final    Comment: (NOTE) SARS-CoV-2 target nucleic acids are NOT DETECTED.  The SARS-CoV-2 RNA is generally detectable in upper respiratory specimens during the acute phase of infection. The lowest concentration of SARS-CoV-2 viral copies this assay can detect is 138 copies/mL. A negative result does not preclude SARS-Cov-2 infection and should not be used as the sole basis for treatment or other patient management decisions. A negative result may occur with  improper specimen collection/handling, submission of specimen other than nasopharyngeal swab, presence of viral mutation(s) within the areas targeted by this assay, and inadequate number of viral copies(<138 copies/mL). A negative result must be combined with clinical observations, patient history, and  epidemiological information. The expected result is Negative.  Fact Sheet for Patients:  BloggerCourse.com  Fact Sheet for Healthcare Providers:  SeriousBroker.it  This test is no t yet approved or cleared by the [place] FDA and  has been authorized for detection and/or diagnosis of SARS-CoV-2 by FDA under an Emergency Use Authorization (EUA). This EUA will remain  in effect (meaning this test can be used) for the duration of the COVID-19 declaration under Section 564(b)(1) of the Act, 21 U.S.C.section 360bbb-3(b)(1), unless the authorization is terminated  or revoked sooner.       Influenza A by PCR NEGATIVE NEGATIVE Final   Influenza B by PCR NEGATIVE NEGATIVE Final    Comment: (NOTE) The Xpert Xpress SARS-CoV-2/FLU/RSV plus assay is intended as an aid in the diagnosis of influenza from Nasopharyngeal swab specimens and should not be used as a sole basis for treatment. Nasal washings and aspirates are unacceptable for Xpert Xpress SARS-CoV-2/FLU/RSV testing.  Fact Sheet for Patients: BloggerCourse.com  Fact Sheet for Healthcare Providers: SeriousBroker.it  This test is not yet approved or cleared by the [place] FDA and has been authorized for detection and/or diagnosis of SARS-CoV-2 by FDA under an Emergency Use Authorization (EUA). This EUA will remain in effect (meaning this test can be used) for the duration of the COVID-19 declaration under Section 564(b)(1) of the Act, 21 U.S.C. section 360bbb-3(b)(1), unless the authorization is terminated or revoked.  Performed at [facility], [address], [place], [place] [address]     Radiology Studies: No results found.    Toria Monte T. Nita Whitmire [facility]  If 7PM-7AM, please contact night-coverage [email or web address] 09/17/2021, 2:04 PM

## 2021-09-17 NOTE — Progress Notes (Signed)
IV consult placed for PIV placement. Pt adamantly refused placement. IV RN asked floor nurse to come speak with patient who became agitated and refused placement and requested to leave numerous times. Will complete consult and requested new one be placed if patient becomes more amiable.  ?Lisl Slingerland Loyola Mast, RN ? ?

## 2021-09-18 DIAGNOSIS — E876 Hypokalemia: Secondary | ICD-10-CM | POA: Diagnosis not present

## 2021-09-18 DIAGNOSIS — F200 Paranoid schizophrenia: Secondary | ICD-10-CM | POA: Diagnosis not present

## 2021-09-18 DIAGNOSIS — E43 Unspecified severe protein-calorie malnutrition: Secondary | ICD-10-CM | POA: Diagnosis not present

## 2021-09-18 DIAGNOSIS — E162 Hypoglycemia, unspecified: Secondary | ICD-10-CM | POA: Diagnosis not present

## 2021-09-18 LAB — GLUCOSE, CAPILLARY
Glucose-Capillary: 57 mg/dL — ABNORMAL LOW (ref 70–99)
Glucose-Capillary: 60 mg/dL — ABNORMAL LOW (ref 70–99)
Glucose-Capillary: 62 mg/dL — ABNORMAL LOW (ref 70–99)
Glucose-Capillary: 63 mg/dL — ABNORMAL LOW (ref 70–99)
Glucose-Capillary: 71 mg/dL (ref 70–99)
Glucose-Capillary: 78 mg/dL (ref 70–99)
Glucose-Capillary: 95 mg/dL (ref 70–99)

## 2021-09-18 MED ORDER — GLUCAGON HCL RDNA (DIAGNOSTIC) 1 MG IJ SOLR
0.5000 mg | INTRAMUSCULAR | Status: AC
  Administered 2021-09-18: 0.5 mg via INTRAMUSCULAR

## 2021-09-18 MED ORDER — HALOPERIDOL LACTATE 5 MG/ML IJ SOLN
5.0000 mg | Freq: Once | INTRAMUSCULAR | Status: AC
Start: 2021-09-18 — End: 2021-09-18
  Administered 2021-09-18: 5 mg via INTRAMUSCULAR
  Filled 2021-09-18: qty 1

## 2021-09-18 MED ORDER — HALOPERIDOL DECANOATE 100 MG/ML IM SOLN
50.0000 mg | Freq: Once | INTRAMUSCULAR | Status: AC
  Administered 2021-09-19: 50 mg via INTRAMUSCULAR
  Filled 2021-09-18: qty 0.5

## 2021-09-18 MED ORDER — GLUCAGON HCL RDNA (DIAGNOSTIC) 1 MG IJ SOLR
INTRAMUSCULAR | Status: AC
  Filled 2021-09-18: qty 1

## 2021-09-18 MED ORDER — GLUCAGON HCL RDNA (DIAGNOSTIC) 1 MG IJ SOLR
0.5000 mg | INTRAMUSCULAR | Status: AC
  Administered 2021-09-18: 0.5 mg via INTRAMUSCULAR
  Filled 2021-09-18: qty 0.5

## 2021-09-18 MED ORDER — STERILE WATER FOR INJECTION IJ SOLN
INTRAMUSCULAR | Status: AC
  Filled 2021-09-18: qty 10

## 2021-09-18 NOTE — Progress Notes (Signed)
Pt has refused medications tonight, a new PIV, as well as blood work this morning. Pt has allowed glucose checks Q4. 0400 glucose was 63. Johann Capers, NP notified. Offered orange juice and explained its necessity. Pt refused. Left orange juice at bedside.  ?

## 2021-09-18 NOTE — Progress Notes (Signed)
Patient has agreed to IM Glucagon at this time.  ?

## 2021-09-18 NOTE — Progress Notes (Signed)
Hypoglycemic Event ? ?CBG: 57 @ 1346 ? ?Treatment: Glucagon IM 0.5 mg ? ?Symptoms: None ? ?Follow-up CBG: Time:1401 CBG Result:86 ? ?Possible Reasons for Event: Inadequate meal intake ? ?Comments/MD notified: ?Ludwig Lean MD ? ? ?

## 2021-09-18 NOTE — Consult Note (Signed)
[name], 62 y.o., female patient presented to [facility] under IVC on 09/12/21 by her guardian with complaints of being a danger to self and others.   Patient continues to refuse to engage with this provider, staff, and or family at this time. She continues to refuse food and or oral intake. She did consent to IM Glucagon this morning, after her blood sugar dropped. She however has not consented to IV replacement at this time. As with previous visits, we continue to encourage her to eat food, so that we can discharge her home at her request. She lacks capacity, insight and judgement to understand her schizophrenia is affecting her physical health at this time which is requiring hospitalization.  ? ?Patient refuses to participate or engage with this provider today. She has her back turned towards me and will not interact at this time.    ? ?  ?-Patient meet criteria for forced medication orders; outlined in 10A NCAC 26D.1104.  ?  ?NEFM:  ?Haldol 5 mg p.o. daily or Haldol 5 mg IM if patient refuses oral haloperidol. ? ?Due to lack of oral intake, patient is high risk to  develop dystonia and other EPS symptoms that may result in restlessness, agitation, and worsening of clinical presentation. Please limit use of (2) more antipsychotics at this time.  ? ?Goal is to increase haldol dose and transition to long acting injection, as she has done well with this in the past.  ?  ?-Will start Megace 20 mg p.o. twice daily for cachexia and poor appetite.  ? ?-Continue with nutritionist recommendations, and encourage patient to eat throughout the day. She may be irritable but encourage her small snacks and meals throughout. Any oral intake is better than none.  ? ?-SHe continues to remain a danger to himself and others will continue with involuntary commitment at this time. ? ?-Will continue to require inpatient psychiatric hospitalization for stabilization. ? ?-Psychiatry will continue to follow at this  time. ? ?Disposition: ?Recommend psychiatric Inpatient admission when medically cleared. ? ?Caryn Bee, DNP, PMHNP-BC ? ? ? ? ?

## 2021-09-18 NOTE — Progress Notes (Signed)
Hypoglycemic Event ? ?CBG: 62 @ 0804 ? ?Treatment: Patient adamently refusing treatment for hypoglycemia, she is refusing breakfast as well. Patient very paranoid at this time.  ? ?Symptoms: None ? ?Possible Reasons for Event: Inadequate meal intake ? ?Comments/MD notified: T. Alanda Slim MD ? ?

## 2021-09-18 NOTE — Progress Notes (Signed)
Patient cooperative with IM Haldol administration.  ?

## 2021-09-18 NOTE — Progress Notes (Signed)
PROGRESS NOTE  [name] ZOX:[id number] DOB: 09/23/1960   PCP: [facility]  Patient is from: Home  DOA: 09/12/2021 LOS: 2  Chief complaints:  Chief Complaint  Patient presents with   Psychiatric Evaluation     Brief Narrative / Interim history: 61 year old F with PMH of paranoid schizophrenia who was presented to ED on 09/12/2021 under IVC from family due to not taking her meds, auditory hallucination and delusional thoughts.  She was in ED waiting on inpatient psychiatric hospitalization but became hypoglycemic due to refusal to eat or drink, and admitted for hypoglycemia.  She was started on D5 infusion but removing IV lines and refusing care and oral intake.  Psychiatry following.   Subjective: Seen and examined earlier this morning.  Patient continues to refuse oral intake, lab draws and IV line.  She also refused Haldol last night.  She wants to be released from the hospital.  She is talking about suing the hospital for malpractice.  She became agitated when I tried to explain the rationale for hospitalization, medication and p.o. intake  Objective: Vitals:   09/17/21 0605 09/17/21 1358 09/18/21 0557 09/18/21 1344  BP:  97/73 101/64 (!) 92/58  Pulse: 62 79 83 73  Resp:  18 14 18   Temp:  98.4 F (36.9 C) 99.2 F (37.3 C) 98.7 F (37.1 C)  TempSrc:  Oral Oral Oral  SpO2:  97% 96% 99%  Weight:      Height:        Examination:  GENERAL: Frail looking elderly female.  Nontoxic. HEENT: MMM.  Vision and hearing grossly intact.  RESP:  No IWOB.  On room air. CVS: Hemodynamically stable. MSK/EXT:  Moves extremities.  Significant muscle mass and subcu fat loss. SKIN: no apparent skin lesion or wound NEURO: Awake and alert. No apparent focal neuro deficit. PSYCH: Very agitated  Patient report patient refused further physical exam.  Procedures:  None  Microbiology summarized: COVID-19 and influenza PCR nonreactive.  Assessment and Plan: *  Hypoglycemia Recurrent hypoglycemia due to refusal to eat in the setting of schizophrenia.  She was started on D5 infusion but removed her IV lines.  She is now agreeable to intermittent dextrose pushes. -May require NGT but I afraid she will pull it out with ongoing psychosis and paranoia.  -Discussed with patient's sister over the phone. In Agreement with NGT and restraint if she if she continues to refuse is p.o. intake or IV after long-acting Haldol injection on 3/3. -Monitor CBG. Dextrose amps prn -Daily weight  Protein-calorie malnutrition, severe (HCC) As evidenced by significant muscle mass and subcu fat loss, low BMI and refusal of p.o. intake and hypoglycemia -Psychiatry started Megace -Management as above. Body mass index is 16.04 kg/m. Nutrition Problem: Inadequate oral intake Etiology: chronic illness (paranoid schizophrenia) Signs/Symptoms: meal completion < 25% (refusing PO intakes) Interventions: Ensure Enlive (each supplement provides 350kcal and 20 grams of protein), Boost Breeze, MVI  Schizophrenia, paranoid (HCC) Continues to be paranoid.  Refuses care including meds and food. Missed her night dose of haldol last night. -Psychiatry following  -Patient is under IVC by family members -On Haldol 5 mg nightly. Missed her dose last night. Gave IM this morning.  -Discussed with patient's RN and the staff at [facility] that meds will be forced if she refuses -Psychiatry planning to transition to long-acting Haldol on 3/3. -On Ativan 2 mg every 6 hours as needed -Cogentin as needed -Psych recommended inpatient psych hospitalization -Follow further recommendation by psych.  Metabolic acidosis Seems to have resolved.  Could be starvation ketosis.  Hypokalemia Replenished.  Refused lab draw this morning. -Will attempt rechecking in the morning   DVT prophylaxis:  enoxaparin (LOVENOX) injection 30 mg Start: 09/18/21 1000  Code Status: Full code Family  Communication: Updated patient's sister, legal guardian over the phone. Level of care: Med-Surg Status is: Inpatient Remains inpatient appropriate because: Hypoglycemia and schizophrenia     Final disposition: Inpatient psychiatric hospital      Consultants:  Psychiatry   Sch Meds:  Scheduled Meds:  enoxaparin (LOVENOX) injection  30 mg Subcutaneous Daily   feeding supplement  1 Container Oral TID BM   feeding supplement  237 mL Oral BID BM   haloperidol  5 mg Oral QHS   Or   haloperidol lactate  5 mg Intramuscular QHS   [START ON 09/19/2021] haloperidol decanoate  50 mg Intramuscular Once   megestrol  20 mg Oral BID   multivitamin with minerals  1 tablet Oral Daily   sodium chloride flush  3 mL Intravenous Q12H   sterile water (preservative free)       Continuous Infusions:  sodium chloride     dextrose Stopped (09/17/21 0518)   PRN Meds:.sodium chloride, benztropine **OR** benztropine mesylate, dextrose, LORazepam **OR** LORazepam, sodium chloride flush, traZODone  Antimicrobials: Anti-infectives (From admission, onward)    None        I have personally reviewed the following labs and images: CBC: Recent Labs  Lab 09/12/21 1805 09/17/21 0518  WBC 5.9 4.4  NEUTROABS  --  2.9  HGB 12.3 13.4  HCT 38.1 40.4  MCV 87.6 85.8  PLT 173 178   BMP &GFR Recent Labs  Lab 09/12/21 1805 09/15/21 2130 09/17/21 0518  NA 140 137 136  K 3.5 3.7 3.4*  CL 108 106 105  CO2 24 15* 25  GLUCOSE 104* 95 97  BUN 30* 13 7  CREATININE 1.18* 0.82 0.75  CALCIUM 8.9 9.6 8.9  MG  --   --  2.0   Estimated Creatinine Clearance: 50.1 mL/min (by C-G formula based on SCr of 0.75 mg/dL). Liver & Pancreas: Recent Labs  Lab 09/12/21 1805  AST 32  ALT 16  ALKPHOS 54  BILITOT 0.2*  PROT 7.2  ALBUMIN 4.4   No results for input(s): LIPASE, AMYLASE in the last 168 hours. No results for input(s): AMMONIA in the last 168 hours. Diabetic: No results for input(s): HGBA1C in  the last 72 hours. Recent Labs  Lab 09/17/21 2010 09/18/21 0559 09/18/21 0804 09/18/21 0957 09/18/21 1346  GLUCAP 80 63* 62* 95 57*   Cardiac Enzymes: No results for input(s): CKTOTAL, CKMB, CKMBINDEX, TROPONINI in the last 168 hours. No results for input(s): PROBNP in the last 8760 hours. Coagulation Profile: No results for input(s): INR, PROTIME in the last 168 hours. Thyroid Function Tests: No results for input(s): TSH, T4TOTAL, FREET4, T3FREE, THYROIDAB in the last 72 hours. Lipid Profile: No results for input(s): CHOL, HDL, LDLCALC, TRIG, CHOLHDL, LDLDIRECT in the last 72 hours. Anemia Panel: No results for input(s): VITAMINB12, FOLATE, FERRITIN, TIBC, IRON, RETICCTPCT in the last 72 hours. Urine analysis:    Component Value Date/Time   COLORURINE STRAW (A) 03/25/2020 1608   APPEARANCEUR CLEAR 03/25/2020 1608   LABSPEC 1.004 (L) 03/25/2020 1608   PHURINE 6.0 03/25/2020 1608   GLUCOSEU NEGATIVE 03/25/2020 1608   HGBUR SMALL (A) 03/25/2020 1608   BILIRUBINUR NEGATIVE 03/25/2020 1608   KETONESUR 5 (A) 03/25/2020 1608  PROTEINUR NEGATIVE 03/25/2020 1608   UROBILINOGEN 0.2 04/10/2015 1111   NITRITE NEGATIVE 03/25/2020 1608   LEUKOCYTESUR NEGATIVE 03/25/2020 1608   Sepsis Labs: Invalid input(s): PROCALCITONIN, LACTICIDVEN  Microbiology: Recent Results (from the past 240 hour(s))  Resp Panel by RT-PCR (Flu A&B, Covid) Nasopharyngeal Swab     Status: None   Collection Time: 09/12/21  6:05 PM   Specimen: Nasopharyngeal Swab; Nasopharyngeal(NP) swabs in vial transport medium  Result Value Ref Range Status   SARS Coronavirus 2 by RT PCR NEGATIVE NEGATIVE Final    Comment: (NOTE) SARS-CoV-2 target nucleic acids are NOT DETECTED.  The SARS-CoV-2 RNA is generally detectable in upper respiratory specimens during the acute phase of infection. The lowest concentration of SARS-CoV-2 viral copies this assay can detect is 138 copies/mL. A negative result does not preclude  SARS-Cov-2 infection and should not be used as the sole basis for treatment or other patient management decisions. A negative result may occur with  improper specimen collection/handling, submission of specimen other than nasopharyngeal swab, presence of viral mutation(s) within the areas targeted by this assay, and inadequate number of viral copies(<138 copies/mL). A negative result must be combined with clinical observations, patient history, and epidemiological information. The expected result is Negative.  Fact Sheet for Patients:  BloggerCourse.com  Fact Sheet for Healthcare Providers:  SeriousBroker.it  This test is no t yet approved or cleared by the [place] FDA and  has been authorized for detection and/or diagnosis of SARS-CoV-2 by FDA under an Emergency Use Authorization (EUA). This EUA will remain  in effect (meaning this test can be used) for the duration of the COVID-19 declaration under Section 564(b)(1) of the Act, 21 U.S.C.section 360bbb-3(b)(1), unless the authorization is terminated  or revoked sooner.       Influenza A by PCR NEGATIVE NEGATIVE Final   Influenza B by PCR NEGATIVE NEGATIVE Final    Comment: (NOTE) The Xpert Xpress SARS-CoV-2/FLU/RSV plus assay is intended as an aid in the diagnosis of influenza from Nasopharyngeal swab specimens and should not be used as a sole basis for treatment. Nasal washings and aspirates are unacceptable for Xpert Xpress SARS-CoV-2/FLU/RSV testing.  Fact Sheet for Patients: BloggerCourse.com  Fact Sheet for Healthcare Providers: SeriousBroker.it  This test is not yet approved or cleared by the [place] FDA and has been authorized for detection and/or diagnosis of SARS-CoV-2 by FDA under an Emergency Use Authorization (EUA). This EUA will remain in effect (meaning this test can be used) for the duration of  the COVID-19 declaration under Section 564(b)(1) of the Act, 21 U.S.C. section 360bbb-3(b)(1), unless the authorization is terminated or revoked.  Performed at [facility], [address] [place], [place] [address]     Radiology Studies: No results found.    Carmell Elgin T. Lakendrick Paradis [facility]  If 7PM-7AM, please contact night-coverage [email or web address] 09/18/2021, 3:56 PM

## 2021-09-19 ENCOUNTER — Inpatient Hospital Stay (HOSPITAL_COMMUNITY): Payer: Medicare Other

## 2021-09-19 DIAGNOSIS — E876 Hypokalemia: Secondary | ICD-10-CM | POA: Diagnosis not present

## 2021-09-19 DIAGNOSIS — E162 Hypoglycemia, unspecified: Secondary | ICD-10-CM | POA: Diagnosis not present

## 2021-09-19 DIAGNOSIS — F200 Paranoid schizophrenia: Secondary | ICD-10-CM | POA: Diagnosis not present

## 2021-09-19 DIAGNOSIS — E43 Unspecified severe protein-calorie malnutrition: Secondary | ICD-10-CM | POA: Diagnosis not present

## 2021-09-19 LAB — CBC
HCT: 41.8 % (ref 36.0–46.0)
Hemoglobin: 13.6 g/dL (ref 12.0–15.0)
MCH: 28.8 pg (ref 26.0–34.0)
MCHC: 32.5 g/dL (ref 30.0–36.0)
MCV: 88.6 fL (ref 80.0–100.0)
Platelets: 198 10*3/uL (ref 150–400)
RBC: 4.72 MIL/uL (ref 3.87–5.11)
RDW: 13.1 % (ref 11.5–15.5)
WBC: 12.9 10*3/uL — ABNORMAL HIGH (ref 4.0–10.5)
nRBC: 0 % (ref 0.0–0.2)

## 2021-09-19 LAB — COMPREHENSIVE METABOLIC PANEL
ALT: 14 U/L (ref 0–44)
AST: 35 U/L (ref 15–41)
Albumin: 4.3 g/dL (ref 3.5–5.0)
Alkaline Phosphatase: 52 U/L (ref 38–126)
Anion gap: 20 — ABNORMAL HIGH (ref 5–15)
BUN: 20 mg/dL (ref 6–20)
CO2: 14 mmol/L — ABNORMAL LOW (ref 22–32)
Calcium: 9.1 mg/dL (ref 8.9–10.3)
Chloride: 106 mmol/L (ref 98–111)
Creatinine, Ser: 0.98 mg/dL (ref 0.44–1.00)
GFR, Estimated: >60 mL/min (ref >60)
Glucose, Bld: 169 mg/dL — ABNORMAL HIGH (ref 70–99)
Potassium: 3.8 mmol/L (ref 3.5–5.1)
Sodium: 140 mmol/L (ref 135–145)
Total Bilirubin: 0.7 mg/dL (ref 0.3–1.2)
Total Protein: 7.6 g/dL (ref 6.5–8.1)

## 2021-09-19 LAB — GLUCOSE, CAPILLARY
Glucose-Capillary: 86 mg/dL (ref 70–99)
Glucose-Capillary: 64 mg/dL — ABNORMAL LOW (ref 70–99)
Glucose-Capillary: 57 mg/dL — ABNORMAL LOW (ref 70–99)
Glucose-Capillary: 128 mg/dL — ABNORMAL HIGH (ref 70–99)
Glucose-Capillary: 45 mg/dL — ABNORMAL LOW (ref 70–99)
Glucose-Capillary: 182 mg/dL — ABNORMAL HIGH (ref 70–99)
Glucose-Capillary: 126 mg/dL — ABNORMAL HIGH (ref 70–99)

## 2021-09-19 LAB — MAGNESIUM: Magnesium: 2.4 mg/dL (ref 1.7–2.4)

## 2021-09-19 LAB — PHOSPHORUS: Phosphorus: 3.9 mg/dL (ref 2.5–4.6)

## 2021-09-19 MED ORDER — GLUCAGON HCL RDNA (DIAGNOSTIC) 1 MG IJ SOLR: 0.5000 mg | Freq: Once | INTRAMUSCULAR | Status: DC

## 2021-09-19 MED ORDER — PROSOURCE TF PO LIQD
45.0000 mL | Freq: Every day | ORAL | Status: DC
  Filled 2021-09-19 (×6): qty 45

## 2021-09-19 MED ORDER — VITAL HIGH PROTEIN PO LIQD
1000.0000 mL | ORAL | Status: DC
  Filled 2021-09-19: qty 1000

## 2021-09-19 MED ORDER — GLUCAGON HCL RDNA (DIAGNOSTIC) 1 MG IJ SOLR
0.5000 mg | INTRAMUSCULAR | Status: AC
  Administered 2021-09-19: 0.5 mg via INTRAMUSCULAR
  Filled 2021-09-19: qty 1

## 2021-09-19 MED ORDER — THIAMINE HCL 100 MG/ML IJ SOLN
100.0000 mg | Freq: Every day | INTRAMUSCULAR | Status: DC
  Administered 2021-09-19 – 2021-09-29 (×11): 100 mg via INTRAVENOUS
  Filled 2021-09-19 (×11): qty 2

## 2021-09-19 MED ORDER — GLUCAGON HCL RDNA (DIAGNOSTIC) 1 MG IJ SOLR
INTRAMUSCULAR | Status: AC
  Administered 2021-09-19: 0.5 mg via INTRAMUSCULAR
  Filled 2021-09-19: qty 1

## 2021-09-19 MED ORDER — OSMOLITE 1.2 CAL PO LIQD: 1000.0000 mL | ORAL | Status: DC

## 2021-09-19 MED ORDER — GLUCAGON HCL RDNA (DIAGNOSTIC) 1 MG IJ SOLR: 0.5000 mg | INTRAMUSCULAR | Status: AC

## 2021-09-19 MED ORDER — FREE WATER: 100.0000 mL | Freq: Four times a day (QID) | Status: DC

## 2021-09-19 MED ORDER — GLUCAGON HCL RDNA (DIAGNOSTIC) 1 MG IJ SOLR
INTRAMUSCULAR | Status: AC
  Administered 2021-09-19: 0.5 mg
  Filled 2021-09-19: qty 1

## 2021-09-19 NOTE — Progress Notes (Signed)
Patient presents to [facility] suite for fluoroscopically placed NG tube placement. Patient alert and oriented X 3 and refusing procedure. No procedure performed at this time. ?

## 2021-09-19 NOTE — Progress Notes (Signed)
NUTRITION NOTE ? ?Full assessment completed on 3/1. Consult for RD to initiate and manage TF (verbal order from discussion with MD via secure chat). ? ?Able to talk with sitter and RN earlier this afternoon and with 2W staff concerning placement of small bore NGT and insertion of bridle. Possible plan for restraints.  ? ?Patient remains on a Regular diet. Sitter reported that patient was refusing all foods and beverages. RN reports persistent hypoglycemic events that are not well managed by IM glucagon.  ? ?Patient at very high refeeding risk. Once NGT placement confirmed, Osmolite 1.2 @ 20 ml/hr to advance by 10 ml every 24 hours to reach goal rate of Osmolite 1.2 @ 70 ml/hr with 45 ml Prosource TF once/day and 100 ml free water QID. ? ?At goal, this regimen will provide 2056 kcal, 104 grams protein, and 1778 ml free water.  ? ? ?Estimated Nutritional Needs:  ?Kcal:  1900-2100 ?Protein:  100-115g ?Fluid:  2L/day ? ? ? ? ? ?Trenton Gammon, MS, RD, LDN ?Inpatient Clinical Dietitian ?RD pager # available in AMION  ?After hours/weekend pager # available in AMION ? ?

## 2021-09-19 NOTE — TOC Progression Note (Signed)
Transition of Care (TOC) - Progression Note  ? ? ?Patient Details  ?Name: [name] ?MRN: [id number] ?Date of Birth: Mar 03, 1961 ? ?Transition of Care (TOC) CM/SW Contact  ?Kee Drudge, Marjie Skiff, RN ?Phone Number: ?09/19/2021, 11:32 AM ? ?Clinical Narrative:    ? ?IVC renewed and sent to [facility] office. Custody order received and placed on chart. Law enforcement contacted to come have pt served.  ? ?  ?

## 2021-09-19 NOTE — Consult Note (Addendum)
I have reviewed the note by NP Starkes-Perry, and discussed the plan of care.  I am in agreement with the assessment and plan. Have made minor edits to notes below, namely removing the reference to legal criteria forced medication order (unnecessary for this pt with a responsive guardian). Will need ~4 week overlap with oral formulation. May consider increasing next dose of decanoate based on response to current regimen.  ? ?[name] ? ? ? ? ?No acute changes since September 18, 2021.  ? ?Did obtain consent from legal guardian [name] to start Haldol decanoate for management of paranoid schizophrenia.  Will place new order to receive at this time.  Patient continues to refuse food and oral intake during this hospitalization, despite multiple hypoglycemic episodes requiring emergency medicine measures to include administration of IM glucagon.  Current recommendations are to have Cortrak placed, which consent was obtained also from legal guardian. ? ?[name], 61 y.o., female patient presented to [facility] under IVC on 09/12/21 by her guardian with complaints of being a danger to self and others.  Patient continues to refuse to engage with this provider, staff, and or family at this time. She continues to refuse food and or oral intake. She did consent to IM Glucagon this morning, after her blood sugar dropped. She however has not consented to IV replacement at this time. As with previous visits, we continue to encourage her to eat food, so that we can discharge her home at her request. She lacks capacity, insight and judgement to understand her schizophrenia is affecting her physical health at this time which is requiring hospitalization.  ? ?Patient refuses to participate or engage with this provider today. She has her back turned towards me and will not interact at this time.    ? ?NEFM:  ?Haldol 5 mg p.o. daily or Haldol 5 mg IM if patient refuses oral haloperidol. ? ?Due to lack of oral  intake, patient is high risk to  develop dystonia and other EPS symptoms that may result in restlessness, agitation, and worsening of clinical presentation. Please limit use of (2) more antipsychotics at this time.  ? ?-Will order Haldol decanoate 50 mg IM in a single dose.  We will recommend continuing Cogentin p.o. twice daily as needed for tremors and EPS like symptoms.  ?  ?-Will start Megace 20 mg p.o. twice daily for cachexia and poor appetite.  ? ?-Continue with nutritionist recommendations, and encourage patient to eat throughout the day.  ? ?-SHe continues to remain a danger to himself and others will continue with involuntary commitment at this time. ? ?-Will continue to require inpatient psychiatric hospitalization for stabilization. ? ?-Psychiatry will continue to follow at this time. ? ?Disposition: ?Recommend psychiatric Inpatient admission when medically cleared.  Patient may require long-term hospitalization at [facility], will recommend initiating referral at this time.  As we are currently day 7 with no progress. ? ?Caryn Bee, DNP, PMHNP-BC ? ? ? ? ?

## 2021-09-19 NOTE — Care Management Important Message (Signed)
Important Message ? ?Patient Details IM Letter placed in Patients room. ?Name: [name] ?MRN: [id number] ?Date of Birth: Jul 15, 1961 ? ? ?Medicare Important Message Given:  Yes ? ? ? ? ?Caren Macadam ?09/19/2021, 10:25 AM ?

## 2021-09-19 NOTE — Progress Notes (Signed)
PROGRESS NOTE  [name] ZOX:[id number] DOB: 12/29/1960   PCP: [facility]  Patient is from: Home  DOA: 09/12/2021 LOS: 3  Chief complaints:  Chief Complaint  Patient presents with   Psychiatric Evaluation     Brief Narrative / Interim history: 61 year old F with PMH of paranoid schizophrenia who was presented to ED on 09/12/2021 under IVC from family due to not taking her meds, auditory hallucination and delusional thoughts.  She was in ED waiting on inpatient psychiatric hospitalization but became hypoglycemic due to refusal to eat or drink, and admitted for hypoglycemia.  She was started on D5 infusion but removing IV lines and IV replacement and oral intake.    Patient does started on Haldol without significant improvement in his paranoia.  Continues to refuse IV placement and oral intake.  Recurrent hypoglycemia that has been managed with IM glucagon so far.   3/3 plan for cortrak with restraints after discussion with legal guardian and psychiatry.   Subjective: Seen and examined earlier this morning.  No major events overnight or this morning other than recurrent hypoglycemia.  She has been receiving IM glucagon since she refused IV line for dextrose.  Continues to refuse oral intake.  She says she cannot be forced to eat against her will.  She thinks this is [employer].  She became agitated as I tried to explain the dangers of hypoglycemia, importance of taking her medication and eating.  Objective: Vitals:   09/18/21 1344 09/18/21 1941 09/18/21 1942 09/19/21 0512  BP: (!) 92/58 105/65 105/65 101/64  Pulse: 73 69 65 85  Resp: 18   18  Temp: 98.7 F (37.1 C) 98.6 F (37 C) 98.6 F (37 C) 98.4 F (36.9 C)  TempSrc: Oral Oral Oral Oral  SpO2: 99% 96% 96% 97%  Weight:      Height:        Examination:  GENERAL: Frail looking elderly female.  Nontoxic. HEENT: Chapped lips.  Vision and hearing grossly intact.  NECK: Supple.  No apparent JVD.  RESP:  No IWOB.   On room air. CVS: Vital signs within normal. MSK/EXT:  Moves extremities.  Significant muscle mass and subcu fat loss. SKIN: no apparent skin lesion or wound NEURO: Awake and alert. Oriented appropriately.  No apparent focal neuro deficit. PSYCH: Somewhat agitated  Patient refused further physical exam.  Procedures:  None  Microbiology summarized: COVID-19 and influenza PCR nonreactive.  Assessment and Plan: * Hypoglycemia Recurrent hypoglycemia due to refusal to eat in the setting of schizophrenia.  She was started on D5 infusion but removed her IV lines.  Refused IV line replacement.  Continues to refuse oral intake.  Only allowing glucagon injection.  Remains paranoid despite Haldol.  She is at risk for severe hypoglycemia and its consequence. -Place cortrack and start tube feed. -Bilateral wrist and abdominal restraints so she does not remove the cortrack. -Close monitoring for refeeding syndrome -Appreciate help by psychiatry  Protein-calorie malnutrition, severe (HCC) As evidenced by significant muscle mass and subcu fat loss, low BMI and refusal of p.o. intake and hypoglycemia -Management as above. Body mass index is 16.04 kg/m. Nutrition Problem: Inadequate oral intake Etiology: chronic illness (paranoid schizophrenia) Signs/Symptoms: meal completion < 25% (refusing PO intakes) Interventions: Ensure Enlive (each supplement provides 350kcal and 20 grams of protein), Boost Breeze, MVI  Schizophrenia, paranoid (HCC) Continues to be paranoid.  Refuses care including meds and food.  -Patient is under IVC by family members.  IVC renewed on 3/3. -Psychiatry starting  long-acting Haldol  -On Ativan 2 mg every 6 hours as needed -Cogentin as needed -Bilateral wrist and abdominal restraints -Psych recommended inpatient psych hospitalization -Follow further recommendation by psych.    Metabolic acidosis Seems to have resolved.  Could be starvation  ketosis.  Hypokalemia Replenished.  Refused lab draw this morning. -Recheck after his trend.   DVT prophylaxis:  enoxaparin (LOVENOX) injection 30 mg Start: 09/18/21 1000  Code Status: Full code Family Communication: Updated patient's sister, legal guardian over the phone. Level of care: Med-Surg Status is: Inpatient Remains inpatient appropriate because: Recurrent hypoglycemia and schizophrenia     Final disposition: Inpatient psychiatric hospital      Consultants:  Psychiatry   Sch Meds:  Scheduled Meds:  enoxaparin (LOVENOX) injection  30 mg Subcutaneous Daily   feeding supplement  1 Container Oral TID BM   feeding supplement  237 mL Oral BID BM   glucagon (human recombinant)  0.5 mg Intramuscular Once   glucagon (human recombinant)  0.5 mg Intramuscular STAT   haloperidol  5 mg Oral QHS   Or   haloperidol lactate  5 mg Intramuscular QHS   megestrol  20 mg Oral BID   multivitamin with minerals  1 tablet Oral Daily   sodium chloride flush  3 mL Intravenous Q12H   Continuous Infusions:  sodium chloride     dextrose Stopped (09/17/21 0518)   PRN Meds:.sodium chloride, benztropine **OR** benztropine mesylate, dextrose, LORazepam **OR** LORazepam, sodium chloride flush, traZODone  Antimicrobials: Anti-infectives (From admission, onward)    None        I have personally reviewed the following labs and images: CBC: Recent Labs  Lab 09/12/21 1805 09/17/21 0518  WBC 5.9 4.4  NEUTROABS  --  2.9  HGB 12.3 13.4  HCT 38.1 40.4  MCV 87.6 85.8  PLT 173 178   BMP &GFR Recent Labs  Lab 09/12/21 1805 09/15/21 2130 09/17/21 0518  NA 140 137 136  K 3.5 3.7 3.4*  CL 108 106 105  CO2 24 15* 25  GLUCOSE 104* 95 97  BUN 30* 13 7  CREATININE 1.18* 0.82 0.75  CALCIUM 8.9 9.6 8.9  MG  --   --  2.0   Estimated Creatinine Clearance: 50.1 mL/min (by C-G formula based on SCr of 0.75 mg/dL). Liver & Pancreas: Recent Labs  Lab 09/12/21 1805  AST 32  ALT  16  ALKPHOS 54  BILITOT 0.2*  PROT 7.2  ALBUMIN 4.4   No results for input(s): LIPASE, AMYLASE in the last 168 hours. No results for input(s): AMMONIA in the last 168 hours. Diabetic: No results for input(s): HGBA1C in the last 72 hours. Recent Labs  Lab 09/18/21 2347 09/19/21 0358 09/19/21 0726 09/19/21 0852 09/19/21 1200  GLUCAP 71 64* 57* 128* 45*   Cardiac Enzymes: No results for input(s): CKTOTAL, CKMB, CKMBINDEX, TROPONINI in the last 168 hours. No results for input(s): PROBNP in the last 8760 hours. Coagulation Profile: No results for input(s): INR, PROTIME in the last 168 hours. Thyroid Function Tests: No results for input(s): TSH, T4TOTAL, FREET4, T3FREE, THYROIDAB in the last 72 hours. Lipid Profile: No results for input(s): CHOL, HDL, LDLCALC, TRIG, CHOLHDL, LDLDIRECT in the last 72 hours. Anemia Panel: No results for input(s): VITAMINB12, FOLATE, FERRITIN, TIBC, IRON, RETICCTPCT in the last 72 hours. Urine analysis:    Component Value Date/Time   COLORURINE STRAW (A) 03/25/2020 1608   APPEARANCEUR CLEAR 03/25/2020 1608   LABSPEC 1.004 (L) 03/25/2020 1608   PHURINE 6.0  03/25/2020 1608   GLUCOSEU NEGATIVE 03/25/2020 1608   HGBUR SMALL (A) 03/25/2020 1608   BILIRUBINUR NEGATIVE 03/25/2020 1608   KETONESUR 5 (A) 03/25/2020 1608   PROTEINUR NEGATIVE 03/25/2020 1608   UROBILINOGEN 0.2 04/10/2015 1111   NITRITE NEGATIVE 03/25/2020 1608   LEUKOCYTESUR NEGATIVE 03/25/2020 1608   Sepsis Labs: Invalid input(s): PROCALCITONIN, LACTICIDVEN  Microbiology: Recent Results (from the past 240 hour(s))  Resp Panel by RT-PCR (Flu A&B, Covid) Nasopharyngeal Swab     Status: None   Collection Time: 09/12/21  6:05 PM   Specimen: Nasopharyngeal Swab; Nasopharyngeal(NP) swabs in vial transport medium  Result Value Ref Range Status   SARS Coronavirus 2 by RT PCR NEGATIVE NEGATIVE Final    Comment: (NOTE) SARS-CoV-2 target nucleic acids are NOT DETECTED.  The SARS-CoV-2  RNA is generally detectable in upper respiratory specimens during the acute phase of infection. The lowest concentration of SARS-CoV-2 viral copies this assay can detect is 138 copies/mL. A negative result does not preclude SARS-Cov-2 infection and should not be used as the sole basis for treatment or other patient management decisions. A negative result may occur with  improper specimen collection/handling, submission of specimen other than nasopharyngeal swab, presence of viral mutation(s) within the areas targeted by this assay, and inadequate number of viral copies(<138 copies/mL). A negative result must be combined with clinical observations, patient history, and epidemiological information. The expected result is Negative.  Fact Sheet for Patients:  BloggerCourse.com  Fact Sheet for Healthcare Providers:  SeriousBroker.it  This test is no t yet approved or cleared by the [place] FDA and  has been authorized for detection and/or diagnosis of SARS-CoV-2 by FDA under an Emergency Use Authorization (EUA). This EUA will remain  in effect (meaning this test can be used) for the duration of the COVID-19 declaration under Section 564(b)(1) of the Act, 21 U.S.C.section 360bbb-3(b)(1), unless the authorization is terminated  or revoked sooner.       Influenza A by PCR NEGATIVE NEGATIVE Final   Influenza B by PCR NEGATIVE NEGATIVE Final    Comment: (NOTE) The Xpert Xpress SARS-CoV-2/FLU/RSV plus assay is intended as an aid in the diagnosis of influenza from Nasopharyngeal swab specimens and should not be used as a sole basis for treatment. Nasal washings and aspirates are unacceptable for Xpert Xpress SARS-CoV-2/FLU/RSV testing.  Fact Sheet for Patients: BloggerCourse.com  Fact Sheet for Healthcare Providers: SeriousBroker.it  This test is not yet approved or cleared by the  [place] FDA and has been authorized for detection and/or diagnosis of SARS-CoV-2 by FDA under an Emergency Use Authorization (EUA). This EUA will remain in effect (meaning this test can be used) for the duration of the COVID-19 declaration under Section 564(b)(1) of the Act, 21 U.S.C. section 360bbb-3(b)(1), unless the authorization is terminated or revoked.  Performed at [facility], [address], [place], [place] [address]     Radiology Studies: No results found.    Paula Bill T. [name] [facility]  If 7PM-7AM, please contact night-coverage [email or web address] 09/19/2021, 1:08 PM

## 2021-09-20 DIAGNOSIS — E871 Hypo-osmolality and hyponatremia: Secondary | ICD-10-CM

## 2021-09-20 DIAGNOSIS — E876 Hypokalemia: Secondary | ICD-10-CM | POA: Diagnosis not present

## 2021-09-20 DIAGNOSIS — E162 Hypoglycemia, unspecified: Secondary | ICD-10-CM | POA: Diagnosis not present

## 2021-09-20 DIAGNOSIS — E43 Unspecified severe protein-calorie malnutrition: Secondary | ICD-10-CM | POA: Diagnosis not present

## 2021-09-20 DIAGNOSIS — F200 Paranoid schizophrenia: Secondary | ICD-10-CM | POA: Diagnosis not present

## 2021-09-20 LAB — RENAL FUNCTION PANEL
Albumin: 3.9 g/dL (ref 3.5–5.0)
Anion gap: 7 (ref 5–15)
BUN: 16 mg/dL (ref 6–20)
CO2: 22 mmol/L (ref 22–32)
Calcium: 8.8 mg/dL — ABNORMAL LOW (ref 8.9–10.3)
Chloride: 102 mmol/L (ref 98–111)
Creatinine, Ser: 0.8 mg/dL (ref 0.44–1.00)
GFR, Estimated: >60 mL/min (ref >60)
Glucose, Bld: 125 mg/dL — ABNORMAL HIGH (ref 70–99)
Phosphorus: 2.8 mg/dL (ref 2.5–4.6)
Potassium: 3.1 mmol/L — ABNORMAL LOW (ref 3.5–5.1)
Sodium: 131 mmol/L — ABNORMAL LOW (ref 135–145)

## 2021-09-20 LAB — GLUCOSE, CAPILLARY
Glucose-Capillary: 116 mg/dL — ABNORMAL HIGH (ref 70–99)
Glucose-Capillary: 134 mg/dL — ABNORMAL HIGH (ref 70–99)
Glucose-Capillary: 135 mg/dL — ABNORMAL HIGH (ref 70–99)
Glucose-Capillary: 142 mg/dL — ABNORMAL HIGH (ref 70–99)
Glucose-Capillary: 143 mg/dL — ABNORMAL HIGH (ref 70–99)
Glucose-Capillary: 155 mg/dL — ABNORMAL HIGH (ref 70–99)

## 2021-09-20 LAB — PREALBUMIN: Prealbumin: 7.6 mg/dL — ABNORMAL LOW (ref 18–38)

## 2021-09-20 LAB — MAGNESIUM
Magnesium: 2.2 mg/dL (ref 1.7–2.4)
Magnesium: 1.9 mg/dL (ref 1.7–2.4)

## 2021-09-20 LAB — PHOSPHORUS
Phosphorus: 2.8 mg/dL (ref 2.5–4.6)
Phosphorus: 1.9 mg/dL — ABNORMAL LOW (ref 2.5–4.6)

## 2021-09-20 MED ORDER — KCL IN DEXTROSE-NACL 20-5-0.9 MEQ/L-%-% IV SOLN
INTRAVENOUS | Status: DC
  Filled 2021-09-20 (×14): qty 1000

## 2021-09-20 MED ORDER — POTASSIUM CHLORIDE 10 MEQ/100ML IV SOLN
10.0000 meq | INTRAVENOUS | Status: AC
  Administered 2021-09-20 (×4): 10 meq via INTRAVENOUS
  Filled 2021-09-20: qty 100

## 2021-09-20 NOTE — Assessment & Plan Note (Addendum)
Continue D5-NS with KCl ?

## 2021-09-20 NOTE — Progress Notes (Addendum)
Radiology Brief Note: ? ?Radiology consulted for fluoro-guided NGT placement on patient with paranoid schizophrenia with auditory hallucinations and delusional thoughts. ? ?Fluoro MD, PA, and techs have been working to find a promising and safe approach to NGT placement, however patient is not cooperative.  Although patient can't legally refuse with her current level of agitation and fluoro resources successful placement is not possible at this time and likely to be unsafe. ? ?PA went to bedside for assessment and troubleshooting. Despite her restraints on the unit she is quite agitated just talking about the tube- shaking her head, yelling, rocking.  She has been able to get haldol IM however remains easily riled up.  She tells me that she does not want it and will do everything she can to prevent it. She is in restraints, however is constantly bending her head down to her hands to scratch or wipe her nose.  She says she will yank it if she "wakes up" with one. There is no ability to keep restraints on this patient in fluoro with our machines and additional staff would likely be needed if placement attempted. Image-guidance will not help with her compliance which is the biggest limitation for anyone trying to place this tube at this time. ? ?Recommend requesting additional input from psych for sedatives vs. Sedation/anesthesia to accomplish tube placement.   ? ?Loyce Dys, MS RD PA-C ? ?   ?

## 2021-09-20 NOTE — Progress Notes (Addendum)
PROGRESS NOTE  Alexander Critchley ZOX:[id number] DOB: 1960/10/04   PCP: [facility]  Patient is from: Home  DOA: 09/12/2021 LOS: 4  Chief complaints:  Chief Complaint  Patient presents with   Psychiatric Evaluation     Brief Narrative / Interim history: 61 year old F with PMH of paranoid schizophrenia who was presented to ED on 09/12/2021 under IVC from family due to not taking her meds, auditory hallucination and delusional thoughts.  She was in ED waiting on inpatient psychiatric hospitalization but became hypoglycemic due to refusal to eat or drink, and admitted for hypoglycemia.  She was started on D5 infusion but removing IV lines and IV replacement and oral intake.    Patient does started on Haldol without significant improvement in her paranoia.  Continued to refuse IV placement. No oral intake for a week. Has been requiring frequent glucagon for recurrent hypoglycemia.   3/3-Discussed with patient's sister who is in agreement with placing NG tube for feeding.  Patient's sister in agreement with restraining patient since she could pull the NG tube out. Unfortunately, ICU RN not able to place cortrak/NGT.  Requested fluoroscopy guided NG tube placement which was not performed since "patient refusing procedure".  However, patient has no capacity.  Legal guardian, patient's sister consents for procedures and treatment.  IV line placed.  Patient was started on dextrose.  3/4-called on-call radiologist for fluoroscopy, and was advised to consider NG tube placement under general anesthesia if it is absolutely necessary.  Per radiology, they do not place NG tube under fluoroscopy with restraints if patient is not cooperative.  Discussed this with patient's sister, legal guardian again.  She verbally consented for NG tube placement under general esthesia. Called and talked to CRNA who advised me to call [phone] for anesthesia. Unfortunately, multiple attempts to call this number unsuccessful.      Subjective: Seen and examined earlier this morning.  No major events overnight of this morning.  Fluoroscopy guided NG tube placement was not performed yesterday.  Reportedly, patient refused.  She has no complaint today.  She appears calm but continues to refuse p.o. intake  Objective: Vitals:   09/20/21 0540 09/20/21 0700 09/20/21 1100 09/20/21 1316  BP: 113/67 102/62 114/66 114/72  Pulse: 65 (!) 59 71 72  Resp: 16 16 15 18   Temp: 97.9 F (36.6 C) 98.1 F (36.7 C) 97.9 F (36.6 C) 98.4 F (36.9 C)  TempSrc: Oral Oral Oral Oral  SpO2: 97% 99% 97% 99%  Weight:      Height:        Examination:  GENERAL: Frail looking.  Nontoxic. HEENT: MMM.  Vision and hearing grossly intact.  NECK: Supple.  No apparent JVD.  RESP:  No IWOB.  Fair aeration bilaterally. CVS:  RRR. Heart sounds normal.  ABD/GI/GU: BS+. Abd soft, NTND.  MSK/EXT:  Moves extremities.  Significant muscle mass and subcu fat loss. SKIN: no apparent skin lesion or wound NEURO: Awake and alert. Oriented fairly.  No apparent focal neuro deficit. PSYCH: Calm.  No agitation.   Procedures:  None  Microbiology summarized: COVID-19 and influenza PCR nonreactive.  Assessment and Plan: * Hypoglycemia Recurrent hypoglycemia due to refusal to eat in the setting of schizophrenia.  No p.o. intake for a week. Remains paranoid and refusing p.o. intake despite Haldol.  Previously removed IV line.  Prealbumin 7.6. -IV line replaced with restraints on 3/3.  Started on D5 infusion -Attempt to place NGT by ICU RN unsuccessful on 3/3. -Reportedly refused to fluoroscopy  guided NG tube placement on 3/3.  -On 3/4, on-call radiologist, Dr. Oleta Mouse advised NG tube placement under anesthesia.  He do not place NG tube under restraints.   -Legal guardian in agreement for NGT placement by any means including general anesthesia. -Called CRNA on-call at [phone].  I was advised to call [phone] to talk to anesthesiologist but no success  despite multiple attempts. -Called and discussed with Dr. Oleta Mouse again. He agreed to try NGT under fluoroscopy if patient is cooperative.  -Change D5 to D5-NS-KCl given hyponatremia and hypokalemia -Continue restraints to maintain IV line -Appreciate help by psychiatry-had long-acting Haldol injection on 3/3. -Continue IV thiamine.  Protein-calorie malnutrition, severe (HCC) As evidenced by significant muscle mass and subcu fat loss, low BMI and refusal of p.o. intake and hypoglycemia -Management as above. Body mass index is 16.04 kg/m. Nutrition Problem: Inadequate oral intake Etiology: chronic illness (paranoid schizophrenia) Signs/Symptoms: meal completion < 25% (refusing PO intakes) Interventions: Ensure Enlive (each supplement provides 350kcal and 20 grams of protein), Boost Breeze, MVI  Schizophrenia, paranoid (HCC) Looks calmer after long-acting Haldol injection but continues to refuse p.o. intake  -Patient is under IVC by family members.  IVC renewed on 3/3. -Had long-acting Haldol injection on 3/3.  She is also on p.o. Haldol -On Ativan 2 mg every 6 hours as needed -Cogentin as needed -Bilateral wrist and abdominal restraints -Psych recommended inpatient psych hospitalization -Follow further recommendation by psych.    Metabolic acidosis Resolved.  Hypokalemia Replenished.  Refused lab draw this morning. -IV KCl 10 mill equivalent x4 -Added KCl to IV fluid as well -Recheck in the morning  Hyponatremia Likely due to D5. -Change IV fluid as above -Recheck in the morning   DVT prophylaxis:  enoxaparin (LOVENOX) injection 30 mg Start: 09/18/21 1000  Code Status: Full code Family Communication: Updated patient's sister, legal guardian over the phone. Level of care: Med-Surg Status is: Inpatient Remains inpatient appropriate because: Recurrent hypoglycemia and schizophrenia     Final disposition: Inpatient psychiatric hospital      Consultants:   Psychiatry   Sch Meds:  Scheduled Meds:  enoxaparin (LOVENOX) injection  30 mg Subcutaneous Daily   feeding supplement  1 Container Oral TID BM   feeding supplement  237 mL Oral BID BM   feeding supplement (PROSource TF)  45 mL Per Tube Daily   free water  100 mL Per Tube Q6H   glucagon (human recombinant)  0.5 mg Intramuscular Once   haloperidol  5 mg Oral QHS   Or   haloperidol lactate  5 mg Intramuscular QHS   megestrol  20 mg Oral BID   multivitamin with minerals  1 tablet Oral Daily   sodium chloride flush  3 mL Intravenous Q12H   thiamine injection  100 mg Intravenous Daily   Continuous Infusions:  sodium chloride     dextrose 5 % and 0.9 % NaCl with KCl 20 mEq/L 100 mL/hr at 09/20/21 1009   feeding supplement (OSMOLITE 1.2 CAL)     PRN Meds:.sodium chloride, benztropine **OR** benztropine mesylate, dextrose, LORazepam **OR** LORazepam, sodium chloride flush, traZODone  Antimicrobials: Anti-infectives (From admission, onward)    None        I have personally reviewed the following labs and images: CBC: Recent Labs  Lab 09/17/21 0518 09/19/21 1451  WBC 4.4 12.9*  NEUTROABS 2.9  --   HGB 13.4 13.6  HCT 40.4 41.8  MCV 85.8 88.6  PLT 178 198   BMP &GFR Recent Labs  Lab 09/15/21 2130 09/17/21 0518 09/19/21 1451 09/19/21 1817 09/20/21 0539  NA 137 136 140  --  131*  K 3.7 3.4* 3.8  --  3.1*  CL 106 105 106  --  102  CO2 15* 25 14*  --  22  GLUCOSE 95 97 169*  --  125*  BUN 13 7 20   --  16  CREATININE 0.82 0.75 0.98  --  0.80  CALCIUM 9.6 8.9 9.1  --  8.8*  MG  --  2.0  --  2.4 2.2  PHOS  --   --   --  3.9 2.8   2.8   Estimated Creatinine Clearance: 49.6 mL/min (by C-G formula based on SCr of 0.8 mg/dL). Liver & Pancreas: Recent Labs  Lab 09/19/21 1451 09/20/21 0539  AST 35  --   ALT 14  --   ALKPHOS 52  --   BILITOT 0.7  --   PROT 7.6  --   ALBUMIN 4.3 3.9   No results for input(s): LIPASE, AMYLASE in the last 168 hours. No results  for input(s): AMMONIA in the last 168 hours. Diabetic: No results for input(s): HGBA1C in the last 72 hours. Recent Labs  Lab 09/19/21 2042 09/20/21 0002 09/20/21 0348 09/20/21 0822 09/20/21 1136  GLUCAP 126* 134* 142* 135* 143*   Cardiac Enzymes: No results for input(s): CKTOTAL, CKMB, CKMBINDEX, TROPONINI in the last 168 hours. No results for input(s): PROBNP in the last 8760 hours. Coagulation Profile: No results for input(s): INR, PROTIME in the last 168 hours. Thyroid Function Tests: No results for input(s): TSH, T4TOTAL, FREET4, T3FREE, THYROIDAB in the last 72 hours. Lipid Profile: No results for input(s): CHOL, HDL, LDLCALC, TRIG, CHOLHDL, LDLDIRECT in the last 72 hours. Anemia Panel: No results for input(s): VITAMINB12, FOLATE, FERRITIN, TIBC, IRON, RETICCTPCT in the last 72 hours. Urine analysis:    Component Value Date/Time   COLORURINE STRAW (A) 03/25/2020 1608   APPEARANCEUR CLEAR 03/25/2020 1608   LABSPEC 1.004 (L) 03/25/2020 1608   PHURINE 6.0 03/25/2020 1608   GLUCOSEU NEGATIVE 03/25/2020 1608   HGBUR SMALL (A) 03/25/2020 1608   BILIRUBINUR NEGATIVE 03/25/2020 1608   KETONESUR 5 (A) 03/25/2020 1608   PROTEINUR NEGATIVE 03/25/2020 1608   UROBILINOGEN 0.2 04/10/2015 1111   NITRITE NEGATIVE 03/25/2020 1608   LEUKOCYTESUR NEGATIVE 03/25/2020 1608   Sepsis Labs: Invalid input(s): PROCALCITONIN, LACTICIDVEN  Microbiology: Recent Results (from the past 240 hour(s))  Resp Panel by RT-PCR (Flu A&B, Covid) Nasopharyngeal Swab     Status: None   Collection Time: 09/12/21  6:05 PM   Specimen: Nasopharyngeal Swab; Nasopharyngeal(NP) swabs in vial transport medium  Result Value Ref Range Status   SARS Coronavirus 2 by RT PCR NEGATIVE NEGATIVE Final    Comment: (NOTE) SARS-CoV-2 target nucleic acids are NOT DETECTED.  The SARS-CoV-2 RNA is generally detectable in upper respiratory specimens during the acute phase of infection. The lowest concentration of  SARS-CoV-2 viral copies this assay can detect is 138 copies/mL. A negative result does not preclude SARS-Cov-2 infection and should not be used as the sole basis for treatment or other patient management decisions. A negative result may occur with  improper specimen collection/handling, submission of specimen other than nasopharyngeal swab, presence of viral mutation(s) within the areas targeted by this assay, and inadequate number of viral copies(<138 copies/mL). A negative result must be combined with clinical observations, patient history, and epidemiological information. The expected result is Negative.  Fact Sheet for Patients:  BloggerCourse.com  Fact Sheet for Healthcare Providers:  SeriousBroker.it  This test is no t yet approved or cleared by the [place] FDA and  has been authorized for detection and/or diagnosis of SARS-CoV-2 by FDA under an Emergency Use Authorization (EUA). This EUA will remain  in effect (meaning this test can be used) for the duration of the COVID-19 declaration under Section 564(b)(1) of the Act, 21 U.S.C.section 360bbb-3(b)(1), unless the authorization is terminated  or revoked sooner.       Influenza A by PCR NEGATIVE NEGATIVE Final   Influenza B by PCR NEGATIVE NEGATIVE Final    Comment: (NOTE) The Xpert Xpress SARS-CoV-2/FLU/RSV plus assay is intended as an aid in the diagnosis of influenza from Nasopharyngeal swab specimens and should not be used as a sole basis for treatment. Nasal washings and aspirates are unacceptable for Xpert Xpress SARS-CoV-2/FLU/RSV testing.  Fact Sheet for Patients: BloggerCourse.com  Fact Sheet for Healthcare Providers: SeriousBroker.it  This test is not yet approved or cleared by the [place] FDA and has been authorized for detection and/or diagnosis of SARS-CoV-2 by FDA under an Emergency Use  Authorization (EUA). This EUA will remain in effect (meaning this test can be used) for the duration of the COVID-19 declaration under Section 564(b)(1) of the Act, 21 U.S.C. section 360bbb-3(b)(1), unless the authorization is terminated or revoked.  Performed at [facility], [address], [place], [place] [address]     Radiology Studies: No results found.    Mayson Mcneish T. Masha Orbach [facility]  If 7PM-7AM, please contact night-coverage [email or web address] 09/20/2021, 1:45 PM

## 2021-09-20 NOTE — Progress Notes (Signed)
Patient refused all PO medications and also food and drink. ?

## 2021-09-20 NOTE — Plan of Care (Signed)
Pt is alert and oriented x2. When asked if she was hearing voices pt started rambling and unable to comprehend what she was talking about. Pt took po meds but refused boost and any water. Restraints remain in place due to pt attempts to pull IV out. Denies pain.  ?Problem: Health Behavior/Discharge Planning: ?Goal: Ability to manage health-related needs will improve ?Outcome: Not Progressing ?  ?Problem: Nutrition: ?Goal: Adequate nutrition will be maintained ?Outcome: Not Progressing ?  ?Problem: Coping: ?Goal: Level of anxiety will decrease ?Outcome: Not Progressing ?  ?

## 2021-09-21 DIAGNOSIS — E43 Unspecified severe protein-calorie malnutrition: Secondary | ICD-10-CM | POA: Diagnosis not present

## 2021-09-21 DIAGNOSIS — F200 Paranoid schizophrenia: Secondary | ICD-10-CM | POA: Diagnosis not present

## 2021-09-21 DIAGNOSIS — E162 Hypoglycemia, unspecified: Secondary | ICD-10-CM | POA: Diagnosis not present

## 2021-09-21 DIAGNOSIS — E876 Hypokalemia: Secondary | ICD-10-CM | POA: Diagnosis not present

## 2021-09-21 LAB — GLUCOSE, CAPILLARY
Glucose-Capillary: 137 mg/dL — ABNORMAL HIGH (ref 70–99)
Glucose-Capillary: 112 mg/dL — ABNORMAL HIGH (ref 70–99)
Glucose-Capillary: 99 mg/dL (ref 70–99)
Glucose-Capillary: 110 mg/dL — ABNORMAL HIGH (ref 70–99)
Glucose-Capillary: 106 mg/dL — ABNORMAL HIGH (ref 70–99)
Glucose-Capillary: 111 mg/dL — ABNORMAL HIGH (ref 70–99)

## 2021-09-21 NOTE — Assessment & Plan Note (Deleted)
We will recheck and replenish as able. ?

## 2021-09-21 NOTE — Progress Notes (Signed)
PROGRESS NOTE  [name] ZOX:[id number] DOB: Dec 15, 1960   PCP: [facility]  Patient is from: Home  DOA: 09/12/2021 LOS: 5  Chief complaints:  Chief Complaint  Patient presents with   Psychiatric Evaluation     Brief Narrative / Interim history: 61 year old F with PMH of paranoid schizophrenia who was presented to ED on 09/12/2021 under IVC from family due to not taking her meds, auditory hallucination and delusional thoughts.  She was in ED waiting on inpatient psychiatric hospitalization but became hypoglycemic due to refusal to eat or drink, and admitted for hypoglycemia.  She was started on D5 infusion but removing IV lines and IV replacement and oral intake.    Patient does started on Haldol without significant improvement in her paranoia.  Continued to refuse IV placement. No oral intake for a week. Has been requiring frequent glucagon for recurrent hypoglycemia.   3/3-3/5-discussed with patient's sister who is in agreement with placing NG tube for feeding.  Patient's sister on board with soft restraints since she could pull the NG tube out. Unfortunately, ICU RN not able to advance small bore NGT.  Radiology deferred fluoroscopy guided NG tube placement inpatient under restraints, and recommended placement under sedation or general anesthesia.  Discussed with legal guardian who is on board with sedation and/or general anesthesia.  General surgery deferred NG tube placement to radiology whether it is under fluoroscopy or sedation/anesthesia.  Also discussed with on-call GI but they do not do NG tube placement.     Subjective: Seen and examined earlier this morning.  No major events overnight or this morning.  Remains under soft restraints.  Continues to refuse p.o. intakes and medications.  She became agitated when asked for clarification on what she said that I did not hear well.  Objective: Vitals:   09/20/21 2342 09/21/21 0344 09/21/21 0747 09/21/21 1232  BP: 104/70  116/70 110/72 114/72  Pulse: (!) 53 61 61 61  Resp: 17 17 17 17   Temp: 97.9 F (36.6 C) 98 F (36.7 C) 98.8 F (37.1 C) 98.5 F (36.9 C)  TempSrc: Oral  Oral Oral  SpO2: 98% 98% 100% 98%  Weight:      Height:        Examination:  GENERAL: No apparent distress.  Nontoxic. HEENT: MMM.  Vision and hearing grossly intact.  NECK: Supple.  No apparent JVD.  RESP:  No IWOB.  Fair aeration bilaterally. CVS:  RRR. Heart sounds normal.  ABD/GI/GU: BS+. Abd soft, NTND.  MSK/EXT: Significant muscle mass and subcu fat loss.  Bilateral wrist restraints. SKIN: no apparent skin lesion or wound NEURO: Awake and alert. Oriented appropriately.  No apparent focal neuro deficit. PSYCH: Gets easily agitated.  Procedures:  None  Microbiology summarized: COVID-19 and influenza PCR nonreactive.  Assessment and Plan: * Hypoglycemia Recurrent hypoglycemia due to refusal to eat in the setting of schizophrenia.  No p.o. intake for a week. Remains paranoid and refusing p.o. intake despite Haldol.  Previously removed IV line.  Prealbumin 7.6. -IV line replaced with restraints on 3/3.  Started on D5 infusion -No success with NG tube placement as of yet.  I do not think TPN is appropriate with intact GI tract -Continue D5-NS-KCl given hyponatremia and hypokalemia -Continue restraints to maintain IV line and IV medications -Notified staff that IV/IM meds should be administered under forced medications even if she refuses -Appreciate help by psychiatry-had long-acting Haldol injection on 3/3. -Continue IV thiamine.   Protein-calorie malnutrition, severe (HCC) As evidenced  by significant muscle mass and subcu fat loss, low BMI and refusal of p.o. intake and hypoglycemia -Management as above. Body mass index is 16.04 kg/m. Nutrition Problem: Inadequate oral intake Etiology: chronic illness (paranoid schizophrenia) Signs/Symptoms: meal completion < 25% (refusing PO intakes) Interventions: Ensure  Enlive (each supplement provides 350kcal and 20 grams of protein), Boost Breeze, MVI  Schizophrenia, paranoid (HCC) Looks calmer after long-acting Haldol injection but continues to refuse p.o. intake and other cares requiring restraints  -Patient is under IVC by family members.  IVC renewed on 3/3. -Had long-acting Haldol injection on 3/3.  She is also on p.o. Haldol -On Ativan 2 mg every 6 hours as needed -Cogentin as needed -Bilateral wrist and abdominal restraints -Psych recommended inpatient psych hospitalization -Follow further recommendation by psych.    Metabolic acidosis Resolved.  Hypokalemia Replenished.  Refused lab draw this morning. -Attempt rechecking but difficult if she does not cooperate  Hypophosphatemia We will recheck and replenish as able.  Hyponatremia IV NS with KCl as above -We will attempt rechecking if she cooperates   DVT prophylaxis:  enoxaparin (LOVENOX) injection 30 mg Start: 09/18/21 1000  Code Status: Full code Family Communication: Updated patient's sister, legal guardian over the phone. Level of care: Med-Surg Status is: Inpatient Remains inpatient appropriate because: Recurrent hypoglycemia and schizophrenia     Final disposition: Inpatient psychiatric hospital      Consultants:  Psychiatry General surgery GI IR Radiology   Sch Meds:  Scheduled Meds:  enoxaparin (LOVENOX) injection  30 mg Subcutaneous Daily   feeding supplement  1 Container Oral TID BM   feeding supplement  237 mL Oral BID BM   feeding supplement (PROSource TF)  45 mL Per Tube Daily   free water  100 mL Per Tube Q6H   glucagon (human recombinant)  0.5 mg Intramuscular Once   haloperidol  5 mg Oral QHS   Or   haloperidol lactate  5 mg Intramuscular QHS   megestrol  20 mg Oral BID   multivitamin with minerals  1 tablet Oral Daily   sodium chloride flush  3 mL Intravenous Q12H   thiamine injection  100 mg Intravenous Daily   Continuous Infusions:   sodium chloride     dextrose 5 % and 0.9 % NaCl with KCl 20 mEq/L 100 mL/hr at 09/21/21 1126   feeding supplement (OSMOLITE 1.2 CAL)     PRN Meds:.sodium chloride, benztropine **OR** benztropine mesylate, dextrose, LORazepam **OR** LORazepam, sodium chloride flush, traZODone  Antimicrobials: Anti-infectives (From admission, onward)    None        I have personally reviewed the following labs and images: CBC: Recent Labs  Lab 09/17/21 0518 09/19/21 1451  WBC 4.4 12.9*  NEUTROABS 2.9  --   HGB 13.4 13.6  HCT 40.4 41.8  MCV 85.8 88.6  PLT 178 198   BMP &GFR Recent Labs  Lab 09/15/21 2130 09/17/21 0518 09/19/21 1451 09/19/21 1817 09/20/21 0539 09/20/21 2149  NA 137 136 140  --  131*  --   K 3.7 3.4* 3.8  --  3.1*  --   CL 106 105 106  --  102  --   CO2 15* 25 14*  --  22  --   GLUCOSE 95 97 169*  --  125*  --   BUN 13 7 20   --  16  --   CREATININE 0.82 0.75 0.98  --  0.80  --   CALCIUM 9.6 8.9 9.1  --  8.8*  --  MG  --  2.0  --  2.4 2.2 1.9  PHOS  --   --   --  3.9 2.8   2.8 1.9*   Estimated Creatinine Clearance: 49.6 mL/min (by C-G formula based on SCr of 0.8 mg/dL). Liver & Pancreas: Recent Labs  Lab 09/19/21 1451 09/20/21 0539  AST 35  --   ALT 14  --   ALKPHOS 52  --   BILITOT 0.7  --   PROT 7.6  --   ALBUMIN 4.3 3.9   No results for input(s): LIPASE, AMYLASE in the last 168 hours. No results for input(s): AMMONIA in the last 168 hours. Diabetic: No results for input(s): HGBA1C in the last 72 hours. Recent Labs  Lab 09/20/21 2027 09/21/21 0348 09/21/21 0748 09/21/21 1120 09/21/21 1530  GLUCAP 116* 112* 99 110* 106*   Cardiac Enzymes: No results for input(s): CKTOTAL, CKMB, CKMBINDEX, TROPONINI in the last 168 hours. No results for input(s): PROBNP in the last 8760 hours. Coagulation Profile: No results for input(s): INR, PROTIME in the last 168 hours. Thyroid Function Tests: No results for input(s): TSH, T4TOTAL, FREET4, T3FREE,  THYROIDAB in the last 72 hours. Lipid Profile: No results for input(s): CHOL, HDL, LDLCALC, TRIG, CHOLHDL, LDLDIRECT in the last 72 hours. Anemia Panel: No results for input(s): VITAMINB12, FOLATE, FERRITIN, TIBC, IRON, RETICCTPCT in the last 72 hours. Urine analysis:    Component Value Date/Time   COLORURINE STRAW (A) 03/25/2020 1608   APPEARANCEUR CLEAR 03/25/2020 1608   LABSPEC 1.004 (L) 03/25/2020 1608   PHURINE 6.0 03/25/2020 1608   GLUCOSEU NEGATIVE 03/25/2020 1608   HGBUR SMALL (A) 03/25/2020 1608   BILIRUBINUR NEGATIVE 03/25/2020 1608   KETONESUR 5 (A) 03/25/2020 1608   PROTEINUR NEGATIVE 03/25/2020 1608   UROBILINOGEN 0.2 04/10/2015 1111   NITRITE NEGATIVE 03/25/2020 1608   LEUKOCYTESUR NEGATIVE 03/25/2020 1608   Sepsis Labs: Invalid input(s): PROCALCITONIN, LACTICIDVEN  Microbiology: Recent Results (from the past 240 hour(s))  Resp Panel by RT-PCR (Flu A&B, Covid) Nasopharyngeal Swab     Status: None   Collection Time: 09/12/21  6:05 PM   Specimen: Nasopharyngeal Swab; Nasopharyngeal(NP) swabs in vial transport medium  Result Value Ref Range Status   SARS Coronavirus 2 by RT PCR NEGATIVE NEGATIVE Final    Comment: (NOTE) SARS-CoV-2 target nucleic acids are NOT DETECTED.  The SARS-CoV-2 RNA is generally detectable in upper respiratory specimens during the acute phase of infection. The lowest concentration of SARS-CoV-2 viral copies this assay can detect is 138 copies/mL. A negative result does not preclude SARS-Cov-2 infection and should not be used as the sole basis for treatment or other patient management decisions. A negative result may occur with  improper specimen collection/handling, submission of specimen other than nasopharyngeal swab, presence of viral mutation(s) within the areas targeted by this assay, and inadequate number of viral copies(<138 copies/mL). A negative result must be combined with clinical observations, patient history, and  epidemiological information. The expected result is Negative.  Fact Sheet for Patients:  BloggerCourse.com  Fact Sheet for Healthcare Providers:  SeriousBroker.it  This test is no t yet approved or cleared by the [place] FDA and  has been authorized for detection and/or diagnosis of SARS-CoV-2 by FDA under an Emergency Use Authorization (EUA). This EUA will remain  in effect (meaning this test can be used) for the duration of the COVID-19 declaration under Section 564(b)(1) of the Act, 21 U.S.C.section 360bbb-3(b)(1), unless the authorization is terminated  or revoked sooner.  Influenza A by PCR NEGATIVE NEGATIVE Final   Influenza B by PCR NEGATIVE NEGATIVE Final    Comment: (NOTE) The Xpert Xpress SARS-CoV-2/FLU/RSV plus assay is intended as an aid in the diagnosis of influenza from Nasopharyngeal swab specimens and should not be used as a sole basis for treatment. Nasal washings and aspirates are unacceptable for Xpert Xpress SARS-CoV-2/FLU/RSV testing.  Fact Sheet for Patients: BloggerCourse.com  Fact Sheet for Healthcare Providers: SeriousBroker.it  This test is not yet approved or cleared by the [place] FDA and has been authorized for detection and/or diagnosis of SARS-CoV-2 by FDA under an Emergency Use Authorization (EUA). This EUA will remain in effect (meaning this test can be used) for the duration of the COVID-19 declaration under Section 564(b)(1) of the Act, 21 U.S.C. section 360bbb-3(b)(1), unless the authorization is terminated or revoked.  Performed at [facility], [address], [place], [place] [address]     Radiology Studies: No results found.    Shyah Cadmus T. Ula Couvillon [facility]  If 7PM-7AM, please contact night-coverage [email or web address] 09/21/2021, 3:49 PM

## 2021-09-22 DIAGNOSIS — E878 Other disorders of electrolyte and fluid balance, not elsewhere classified: Secondary | ICD-10-CM

## 2021-09-22 DIAGNOSIS — E876 Hypokalemia: Secondary | ICD-10-CM

## 2021-09-22 DIAGNOSIS — E162 Hypoglycemia, unspecified: Secondary | ICD-10-CM | POA: Diagnosis not present

## 2021-09-22 DIAGNOSIS — F200 Paranoid schizophrenia: Secondary | ICD-10-CM | POA: Diagnosis not present

## 2021-09-22 DIAGNOSIS — E43 Unspecified severe protein-calorie malnutrition: Secondary | ICD-10-CM | POA: Diagnosis not present

## 2021-09-22 LAB — GLUCOSE, CAPILLARY
Glucose-Capillary: 108 mg/dL — ABNORMAL HIGH (ref 70–99)
Glucose-Capillary: 101 mg/dL — ABNORMAL HIGH (ref 70–99)
Glucose-Capillary: 123 mg/dL — ABNORMAL HIGH (ref 70–99)
Glucose-Capillary: 136 mg/dL — ABNORMAL HIGH (ref 70–99)
Glucose-Capillary: 108 mg/dL — ABNORMAL HIGH (ref 70–99)
Glucose-Capillary: 116 mg/dL — ABNORMAL HIGH (ref 70–99)
Glucose-Capillary: 103 mg/dL — ABNORMAL HIGH (ref 70–99)

## 2021-09-22 LAB — CBC
HCT: 39.7 % (ref 36.0–46.0)
Hemoglobin: 13.2 g/dL (ref 12.0–15.0)
MCH: 28.7 pg (ref 26.0–34.0)
MCHC: 33.2 g/dL (ref 30.0–36.0)
MCV: 86.3 fL (ref 80.0–100.0)
Platelets: 172 10*3/uL (ref 150–400)
RBC: 4.6 MIL/uL (ref 3.87–5.11)
RDW: 12.6 % (ref 11.5–15.5)
WBC: 4.5 10*3/uL (ref 4.0–10.5)
nRBC: 0 % (ref 0.0–0.2)

## 2021-09-22 LAB — COMPREHENSIVE METABOLIC PANEL
ALT: 11 U/L (ref 0–44)
AST: 18 U/L (ref 15–41)
Albumin: 3.7 g/dL (ref 3.5–5.0)
Alkaline Phosphatase: 50 U/L (ref 38–126)
Anion gap: 5 (ref 5–15)
BUN: <5 mg/dL — ABNORMAL LOW (ref 6–20)
CO2: 24 mmol/L (ref 22–32)
Calcium: 8.8 mg/dL — ABNORMAL LOW (ref 8.9–10.3)
Chloride: 106 mmol/L (ref 98–111)
Creatinine, Ser: 0.57 mg/dL (ref 0.44–1.00)
GFR, Estimated: >60 mL/min (ref >60)
Glucose, Bld: 113 mg/dL — ABNORMAL HIGH (ref 70–99)
Potassium: 3.5 mmol/L (ref 3.5–5.1)
Sodium: 135 mmol/L (ref 135–145)
Total Bilirubin: 0.2 mg/dL — ABNORMAL LOW (ref 0.3–1.2)
Total Protein: 6.8 g/dL (ref 6.5–8.1)

## 2021-09-22 LAB — PHOSPHORUS: Phosphorus: 2.9 mg/dL (ref 2.5–4.6)

## 2021-09-22 LAB — MAGNESIUM: Magnesium: 1.9 mg/dL (ref 1.7–2.4)

## 2021-09-22 MED ORDER — CLONAZEPAM 0.5 MG PO TABS
0.5000 mg | ORAL_TABLET | Freq: Two times a day (BID) | ORAL | Status: DC
  Administered 2021-09-23 – 2021-09-25 (×5): 0.5 mg via ORAL
  Filled 2021-09-22 (×5): qty 1

## 2021-09-22 NOTE — Consult Note (Signed)
?  [name] is a 61 year old female who presented to [facility] emergency department under involuntary commitment on 09/12/2021, by her legal guardian with complaints of being a danger to self and others. ? ?Patient has shown no progress in terms of her psychosis and paranoia.  She continues to refuse to participate in all treatments to include lab draws, feeding tube, oral medication and eating.  Patient denies having a sister today.  She tells me that her legal guardian is named" [name] my great grandmother. "  Patient states someone is pretending to be her sister, and she does not have a sister. ? ?When asked the patient how she is doing and she replies" I am just here."  She continues to get more upset with additional questions being asked.  At present she is in bilateral soft wrist restraints, likely due to need for IV therapy for fluid resuscitation and hypoglycemia.  She continues to lack capacity, insight, and judgment to understanding her schizophrenia is affecting her physical health at which time is requiring medical intervention and hospitalization.  Patient did receive a long-acting injectable on Friday, March 3, has displayed no improvement in clinical presentation or the psychiatric symptoms. ? ?-Will continue with previous recommendations from nutritionist, [facility]. ? ?-Will start mirtazapine 7.5 mg p.o. nightly for depression, anxiety, appetite, and insomnia. ? ?-Will also add a standing dose of benzodiazepine to help manage agitation. ?-Will continue with oral Haldol for a few more days. ? ?-Psychiatry will continue to follow at this time.  Patient continues to meet inpatient psychiatric criteria, once medically stable. ?

## 2021-09-22 NOTE — Progress Notes (Signed)
Interventional Radiology received request to evaluate this patient for possible NG tube placement with sedation. Request reviewed by Dr. Milford Cage.  ? ?IR recommends further attempts at the bedside with haldol and/or ativan or to re-consult GI/Surgery for assistance.  ? ?No procedure planned and the order will be deleted. Dr. Alanda Slim made aware.  ? ?Please call IR if we can be of further assistance.  ? Alwyn Ren, AGACNP-BC ?[phone] ?09/22/2021, 12:33 PM ? ? ?

## 2021-09-22 NOTE — Care Management Important Message (Signed)
Important Message ? ?Patient Details IM Letter placed in Patients room. ?Name: [name] ?MRN: [id number] ?Date of Birth: 01/17/1961 ? ? ?Medicare Important Message Given:  Yes ? ? ? ? ?Caren Macadam ?09/22/2021, 9:41 AM ?

## 2021-09-22 NOTE — Progress Notes (Signed)
PROGRESS NOTE  [name] MWN:[id number] DOB: 1960/07/31   PCP: [facility]  Patient is from: Home  DOA: 09/12/2021 LOS: 6  Chief complaints:  Chief Complaint  Patient presents with   Psychiatric Evaluation     Brief Narrative / Interim history: 61 year old F with PMH of paranoid schizophrenia who was presented to ED on 09/12/2021 under IVC from family due to not taking her meds, auditory hallucination and delusional thoughts.  She was in ED waiting on inpatient psychiatric hospitalization but became hypoglycemic due to refusal to eat or drink, and admitted for hypoglycemia.  She was started on D5 infusion but removing IV lines and IV replacement and oral intake.    Patient is evaluated by psychiatry.  She was deemed to lack capacity.  Legal guardian, sister consenting for treatment and interventions.   Patient was started on Haldol without significant improvement in her paranoia.  Continued to refuse IV placement and oral intake.  Only able to administer IM glucagon for recurrent hypoglycemia.  The decision was made for nasogastric feeding tube and soft restraints on 09/19/2021 after discussion with legal guardian. Unfortunately, ICU RN not able to advance small bore NGT.  General surgery, GI and IR recommended consulting radiology for fluoroscopy guided NG tube placement by radiology.  Radiology deferred NG tube placement on this patient with restraints, and recommended placement under sedation or general anesthesia.  Legal guardian notified, and on board with sedation and/or general anesthesia for NG tube placement.  Hospital CMO and ethics involved.     Subjective: Seen and examined earlier this morning.  No major events overnight of this morning.  She continues to refuse p.o. intake.  She also refused lab draws and morning blood draws were canceled by lab.  She says she just wants to go home.  She gets aggravated with more conversation.  Objective: Vitals:   09/22/21 0015  09/22/21 0418 09/22/21 0541 09/22/21 1421  BP: 116/65 100/60 106/61 118/76  Pulse: (!) 51 (!) 50 (!) 55 66  Resp:    15  Temp: 98.6 F (37 C) 98.6 F (37 C) 98.4 F (36.9 C) 98.1 F (36.7 C)  TempSrc: Oral Oral Oral Oral  SpO2: 99% 99% 100% 99%  Weight:      Height:        Examination:  GENERAL: Frail.  Nontoxic. HEENT: MMM.  Vision and hearing grossly intact.  NECK: Supple.  No apparent JVD.  RESP:  No IWOB.  Fair aeration bilaterally. CVS:  RRR. Heart sounds normal.  ABD/GI/GU: BS+. Abd soft, NTND.  MSK/EXT: Significant muscle mass and subcu fat loss.  Bilateral wrist restraints. SKIN: no apparent skin lesion or wound NEURO: Awake and alert. Oriented appropriately.  No apparent focal neuro deficit. PSYCH: Calm but aggravated and agitated with more conversation  Procedures:  None  Microbiology summarized: COVID-19 and influenza PCR nonreactive.  Assessment and Plan: * Recurrent hypoglycemia and severe malnutrition Recurrent hypoglycemia due to refusal to eat in the setting of schizophrenia. .Body mass index is 15.89 kg/m. She has refused p.o. intake since admission.  Has been refusing other interventions as well.  Prealbumin 7.6.  Patient was brought to the hospital under IVC by legal guardian, sister.  Evaluated by psych and deemed to lack capacity and meet criteria for medication over objection and inpatient psychiatric hospitalization.  Legal guardian provided consent for treatment and intervention. -IV line replaced with restraints on 3/3.  -Continue D5-NS-KCl given hyponatremia and hypokalemia -Continue restraints to maintain IV line and  IV medications -Bedside NG tube placement by ICU RN unsuccessful.   -General surgery, GI and IR deferred NG tube placement to radiology for NGT placement under fluoro -Fluoro radiology concern about placing NGT in uncooperative patient under soft restraints  -IR consulted back but deferring to other experts [facility] CMO and ethics  consulted. -Legal guardian is on board with NGT placement with it is at bedside, under sedation or general anesthesia -Appreciate help by psychiatry-had long-acting Haldol injection on 3/3. -Continue IV thiamine. -Dietitian recommendation Nutrition Problem: Inadequate oral intake Etiology: chronic illness (paranoid schizophrenia) Signs/Symptoms: meal completion < 25% (refusing PO intakes) Interventions: Ensure Enlive (each supplement provides 350kcal and 20 grams of protein), Boost Breeze, MVI     Schizophrenia, paranoid (HCC) Patient brought to the hospital under IVC by family members.  IVC renewed on 3/3.  Deemed to lack capacity and met criteria for medication over objection and inpatient psychiatric hospitalization by psychiatry. -Had long-acting Haldol injection on 3/3.  She is also on p.o. Haldol -On Ativan 2 mg every 6 hours as needed -Cogentin as needed -Bilateral wrist and abdominal restraints -Follow further recommendation by psych.    Metabolic acidosis Resolved.  Hypokalemia, hypomagnesemia and hypophosphatemia Has been refusing lab draws. -Attempt rechecking but difficult if she does not cooperate  Hyponatremia IV NS with KCl as above -Attempt rechecking if she cooperates   DVT prophylaxis:  enoxaparin (LOVENOX) injection 30 mg Start: 09/18/21 1000  Code Status: Full code Family Communication: Updated patient's sister, legal guardian over the phone. Level of care: Med-Surg Status is: Inpatient Remains inpatient appropriate because: Recurrent hypoglycemia and schizophrenia     Final disposition: Inpatient psychiatric hospital      Consultants:  Psychiatry General surgery GI IR Radiology   Sch Meds:  Scheduled Meds:  enoxaparin (LOVENOX) injection  30 mg Subcutaneous Daily   feeding supplement  1 Container Oral TID BM   feeding supplement  237 mL Oral BID BM   feeding supplement (PROSource TF)  45 mL Per Tube Daily   free water  100 mL Per  Tube Q6H   glucagon (human recombinant)  0.5 mg Intramuscular Once   haloperidol  5 mg Oral QHS   Or   haloperidol lactate  5 mg Intramuscular QHS   megestrol  20 mg Oral BID   multivitamin with minerals  1 tablet Oral Daily   sodium chloride flush  3 mL Intravenous Q12H   thiamine injection  100 mg Intravenous Daily   Continuous Infusions:  sodium chloride     dextrose 5 % and 0.9 % NaCl with KCl 20 mEq/L 100 mL/hr at 09/22/21 0726   feeding supplement (OSMOLITE 1.2 CAL)     PRN Meds:.sodium chloride, benztropine **OR** benztropine mesylate, dextrose, LORazepam **OR** LORazepam, sodium chloride flush, traZODone  Antimicrobials: Anti-infectives (From admission, onward)    None        I have personally reviewed the following labs and images: CBC: Recent Labs  Lab 09/17/21 0518 09/19/21 1451  WBC 4.4 12.9*  NEUTROABS 2.9  --   HGB 13.4 13.6  HCT 40.4 41.8  MCV 85.8 88.6  PLT 178 198   BMP &GFR Recent Labs  Lab 09/15/21 2130 09/17/21 0518 09/19/21 1451 09/19/21 1817 09/20/21 0539 09/20/21 2149  NA 137 136 140  --  131*  --   K 3.7 3.4* 3.8  --  3.1*  --   CL 106 105 106  --  102  --   CO2 15* 25 14*  --  22  --   GLUCOSE 95 97 169*  --  125*  --   BUN 13 7 20   --  16  --   CREATININE 0.82 0.75 0.98  --  0.80  --   CALCIUM 9.6 8.9 9.1  --  8.8*  --   MG  --  2.0  --  2.4 2.2 1.9  PHOS  --   --   --  3.9 2.8   2.8 1.9*   Estimated Creatinine Clearance: 49.6 mL/min (by C-G formula based on SCr of 0.8 mg/dL). Liver & Pancreas: Recent Labs  Lab 09/19/21 1451 09/20/21 0539  AST 35  --   ALT 14  --   ALKPHOS 52  --   BILITOT 0.7  --   PROT 7.6  --   ALBUMIN 4.3 3.9   No results for input(s): LIPASE, AMYLASE in the last 168 hours. No results for input(s): AMMONIA in the last 168 hours. Diabetic: No results for input(s): HGBA1C in the last 72 hours. Recent Labs  Lab 09/21/21 1917 09/22/21 0027 09/22/21 0432 09/22/21 0810 09/22/21 1202  GLUCAP  111* 108* 101* 123* 108*   Cardiac Enzymes: No results for input(s): CKTOTAL, CKMB, CKMBINDEX, TROPONINI in the last 168 hours. No results for input(s): PROBNP in the last 8760 hours. Coagulation Profile: No results for input(s): INR, PROTIME in the last 168 hours. Thyroid Function Tests: No results for input(s): TSH, T4TOTAL, FREET4, T3FREE, THYROIDAB in the last 72 hours. Lipid Profile: No results for input(s): CHOL, HDL, LDLCALC, TRIG, CHOLHDL, LDLDIRECT in the last 72 hours. Anemia Panel: No results for input(s): VITAMINB12, FOLATE, FERRITIN, TIBC, IRON, RETICCTPCT in the last 72 hours. Urine analysis:    Component Value Date/Time   COLORURINE STRAW (A) 03/25/2020 1608   APPEARANCEUR CLEAR 03/25/2020 1608   LABSPEC 1.004 (L) 03/25/2020 1608   PHURINE 6.0 03/25/2020 1608   GLUCOSEU NEGATIVE 03/25/2020 1608   HGBUR SMALL (A) 03/25/2020 1608   BILIRUBINUR NEGATIVE 03/25/2020 1608   KETONESUR 5 (A) 03/25/2020 1608   PROTEINUR NEGATIVE 03/25/2020 1608   UROBILINOGEN 0.2 04/10/2015 1111   NITRITE NEGATIVE 03/25/2020 1608   LEUKOCYTESUR NEGATIVE 03/25/2020 1608   Sepsis Labs: Invalid input(s): PROCALCITONIN, LACTICIDVEN  Microbiology: Recent Results (from the past 240 hour(s))  Resp Panel by RT-PCR (Flu A&B, Covid) Nasopharyngeal Swab     Status: None   Collection Time: 09/12/21  6:05 PM   Specimen: Nasopharyngeal Swab; Nasopharyngeal(NP) swabs in vial transport medium  Result Value Ref Range Status   SARS Coronavirus 2 by RT PCR NEGATIVE NEGATIVE Final    Comment: (NOTE) SARS-CoV-2 target nucleic acids are NOT DETECTED.  The SARS-CoV-2 RNA is generally detectable in upper respiratory specimens during the acute phase of infection. The lowest concentration of SARS-CoV-2 viral copies this assay can detect is 138 copies/mL. A negative result does not preclude SARS-Cov-2 infection and should not be used as the sole basis for treatment or other patient management decisions. A  negative result may occur with  improper specimen collection/handling, submission of specimen other than nasopharyngeal swab, presence of viral mutation(s) within the areas targeted by this assay, and inadequate number of viral copies(<138 copies/mL). A negative result must be combined with clinical observations, patient history, and epidemiological information. The expected result is Negative.  Fact Sheet for Patients:  BloggerCourse.com  Fact Sheet for Healthcare Providers:  SeriousBroker.it  This test is no t yet approved or cleared by the [place] and  has been authorized for  detection and/or diagnosis of SARS-CoV-2 by FDA under an Emergency Use Authorization (EUA). This EUA will remain  in effect (meaning this test can be used) for the duration of the COVID-19 declaration under Section 564(b)(1) of the Act, 21 U.S.C.section 360bbb-3(b)(1), unless the authorization is terminated  or revoked sooner.       Influenza A by PCR NEGATIVE NEGATIVE Final   Influenza B by PCR NEGATIVE NEGATIVE Final    Comment: (NOTE) The Xpert Xpress SARS-CoV-2/FLU/RSV plus assay is intended as an aid in the diagnosis of influenza from Nasopharyngeal swab specimens and should not be used as a sole basis for treatment. Nasal washings and aspirates are unacceptable for Xpert Xpress SARS-CoV-2/FLU/RSV testing.  Fact Sheet for Patients: BloggerCourse.com  Fact Sheet for Healthcare Providers: SeriousBroker.it  This test is not yet approved or cleared by the [place] FDA and has been authorized for detection and/or diagnosis of SARS-CoV-2 by FDA under an Emergency Use Authorization (EUA). This EUA will remain in effect (meaning this test can be used) for the duration of the COVID-19 declaration under Section 564(b)(1) of the Act, 21 U.S.C. section 360bbb-3(b)(1), unless the authorization  is terminated or revoked.  Performed at [facility], [address], [place], [place] [address]     Radiology Studies: No results found.    Tywanna Seifer T. Christophor Eick [facility]  If 7PM-7AM, please contact night-coverage [email or web address] 09/22/2021, 3:37 PM

## 2021-09-22 NOTE — Progress Notes (Signed)
Pt has been calm and pleasant tonight, though not very interactive. She did agree to take her PO haldol when given the choice of PO or IM. She would not take water to swallow the pill, she swallowed it dry. She allowed visualization of oral cavity to ensure pill was swallowed. She refused her other medications. Restraints in place and sitter at bedside. Continue to monitor. Mick Sell RN ?

## 2021-09-23 DIAGNOSIS — E162 Hypoglycemia, unspecified: Secondary | ICD-10-CM | POA: Diagnosis not present

## 2021-09-23 DIAGNOSIS — E43 Unspecified severe protein-calorie malnutrition: Secondary | ICD-10-CM | POA: Diagnosis not present

## 2021-09-23 DIAGNOSIS — E876 Hypokalemia: Secondary | ICD-10-CM | POA: Diagnosis not present

## 2021-09-23 DIAGNOSIS — F200 Paranoid schizophrenia: Secondary | ICD-10-CM | POA: Diagnosis not present

## 2021-09-23 LAB — GLUCOSE, CAPILLARY
Glucose-Capillary: 100 mg/dL — ABNORMAL HIGH (ref 70–99)
Glucose-Capillary: 102 mg/dL — ABNORMAL HIGH (ref 70–99)
Glucose-Capillary: 109 mg/dL — ABNORMAL HIGH (ref 70–99)
Glucose-Capillary: 110 mg/dL — ABNORMAL HIGH (ref 70–99)
Glucose-Capillary: 111 mg/dL — ABNORMAL HIGH (ref 70–99)
Glucose-Capillary: 90 mg/dL (ref 70–99)

## 2021-09-23 LAB — MAGNESIUM
Magnesium: 1.9 mg/dL (ref 1.7–2.4)
Magnesium: 1.9 mg/dL (ref 1.7–2.4)

## 2021-09-23 LAB — PHOSPHORUS
Phosphorus: 3.9 mg/dL (ref 2.5–4.6)
Phosphorus: 3.2 mg/dL (ref 2.5–4.6)

## 2021-09-23 MED ORDER — MIRTAZAPINE 15 MG PO TBDP
7.5000 mg | ORAL_TABLET | Freq: Every day | ORAL | Status: DC
  Administered 2021-09-23 – 2021-09-24 (×2): 7.5 mg via ORAL
  Filled 2021-09-23 (×2): qty 0.5

## 2021-09-23 NOTE — Progress Notes (Signed)
Nutrition Follow-up ? ?DOCUMENTATION CODES:  ? ?Underweight ? ?INTERVENTION:  ? ?-Recommend monitor magnesium, potassium, and phosphorus BID, MD to replete as needed, as pt is at risk for refeeding syndrome. ?  ?-Ensure Plus High Protein po BID, each supplement provides 350 kcal and 20 grams of protein. ?  ?-Boost Breeze po TID, each supplement provides 250 kcal and 9 grams of protein ?  ?-Multivitamin with minerals daily ?  ?If tube feeding placed: ?-Initiate Osmolite 1.2 @ 20 ml/hr, advance by 10 ml every 24-48 hours to goal of 70 ml/hr. ?Provides 2016 kcals, 93g protein and 1377 ml H2O ?  ?-If patient becomes more compliant, would recommend checking vitamin labs : Thiamine, iron, folate, Vitamin B-12, Vitamins D, A, K, C ? ? ?NUTRITION DIAGNOSIS:  ? ?Inadequate oral intake related to chronic illness (paranoid schizophrenia) as evidenced by meal completion < 25% (refusing PO intakes). ? ?Ongoing. Suspect severe malnutrition ? ?GOAL:  ? ?Patient will meet greater than or equal to 90% of their needs ? ?Not meeting. ? ?MONITOR:  ? ?PO intake, Supplement acceptance, Labs, Weight trends, I & O's ? ?ASSESSMENT:  ? ?61 y.o. female with medical history significant of schizophrenia who was brought into the ED on 2/24 with IVC from family indicating that she has not been taking her medications, hearing voices and having hallucination and delusional thoughts. ? ?Tube feeding recommendations above if feeding tube is able to be placed. Multiple attempts have been unsuccessful at this time. ?Pt has now been without nutrition for >12 days now. High refeeding risk, labs are being followed.  ? ?Admission weight: 93 lbs. ? ?Medications: Megace, Multivitamin with minerals daily, Thiamine, D5 infusion @ 100 (providing 408 kcals) ? ?Labs reviewed:  ? CBGs: 90-110 ? ?Diet Order:   ?Diet Order   ? ?       ?  Diet NPO time specified  Diet effective now       ?  ? ?  ?  ? ?  ? ? ?EDUCATION NEEDS:  ? ?Not appropriate for education at this  time ? ?Skin:  Skin Assessment: Reviewed RN Assessment ? ?Last BM:  PTA ? ?Height:  ? ?Ht Readings from Last 1 Encounters:  ?09/15/21 5\' 4"  (1.626 m)  ? ? ?Weight:  ? ?Wt Readings from Last 1 Encounters:  ?09/20/21 42 kg  ? ? ?BMI:  Body mass index is 15.89 kg/m?. ? ?Estimated Nutritional Needs:  ? ?Kcal:  1900-2100 ? ?Protein:  100-115g ? ?Fluid:  2L/day ? ?11/20/21, MS, RD, LDN ?Inpatient Clinical Dietitian ?Contact information available via Amion ? ?

## 2021-09-23 NOTE — Consult Note (Addendum)
Consultation Note   Referring Provider: [facility] PCP: [place]  Gastroenterologist: Althia Forts Reason for consultation:   Decreased PO intake, failed NGT placement, ? Obstruction.                 Assessment and Plan   # 61 yo female with schizophrenia admitted involuntarily with hallucinations and malnutrition from refusal to eat. Under psychiatry care. Still refusing to eat. Two unsuccessful attempts at NGT placement . Admitting team concerned about a mechanical obstruction. NGT not attempted by IR as patient was not cooperative. --Patient was not cooperative during this visit. She doesn't want want an endoscopic evaluation or NGT placement. She says that people of giving her and trying to treat medical problems that she does not have.  --Dr. Ardis Hughs spoke with patient's sister ( legal guardian) on phone. She would like for [place] to proceed with an EGD and place the NGT during the procedure.   --I suspect patient will end up pulling out the NGT but we will plan for the procedure to be done on Thursday morning. NPO Thursday at [facility]  # Additional medical history listed below.    History of Present Illness:   Patient Profile:   Chaela Quintela is a 61 y.o. female with a past medical history significant for schizophrenia  See PMH for any additional medical problems.   History relevant to this admission:   Patient presented to ED admitted involuntarily 2/27 for hallucinations and delusional thoughts. Apparently she had been off her medications.  Psychiatry is following inpatient for psychosis and paranoia.   She has been refusing to eat / drink and has severe malnutrition and electrolyte abnormalities. Refusing IVs. NGT placement attempted twice but unable to be advanced for unclear reasons. General surgery deferred NG tube placement to Radiology but it sounds like they wouldn't be able to do it since patient wasn't being cooperative.     Patient not cooperative for interview. She tells me that she doesn't understand why people are trying to diagnose and treat her with medical problems that she does not have. She does deny dysphagia, nausea / vomiting or blood in stool   Previous GI Evaluations:    Endoscopies:  None   Previous Imaging: No abdominal imaging   Diagnostic studies this admission   Labs:  Liver chemistries normal.  CBC normal.    Past Medical History:  Diagnosis Date   Non compliance w medication regimen    Schizophrenia ([place])     No past surgical history on file.  Prior to Admission medications   Medication Sig Start Date End Date Taking? Authorizing Provider  benztropine (COGENTIN) 0.5 MG tablet Take 1 tablet (0.5 mg total) by mouth 2 (two) times daily. For prevention of drug induced tremors. Patient not taking: Reported on 09/13/2021 10/09/20   Lindell Spar I, NP  haloperidol (HALDOL) 5 MG tablet Take 1 tablet (5 mg total) by mouth 2 (two) times daily. For mood control Patient not taking: Reported on 09/13/2021 10/09/20   Lindell Spar I, NP  megestrol (MEGACE) 20 MG tablet Take 1 tablet (20 mg total) by mouth 2 (two) times daily. For cachexia Patient not taking: Reported on 09/13/2021 10/09/20   Lindell Spar I, NP  traZODone (  DESYREL) 50 MG tablet Take 1 tablet (50 mg total) by mouth at bedtime as needed for sleep. Patient not taking: Reported on 09/13/2021 10/09/20   Lindell Spar I, NP    Current Facility-Administered Medications  Medication Dose Route Frequency Provider Last Rate Last Admin   0.9 %  sodium chloride infusion  250 mL Intravenous PRN Regan Lemming, MD       benztropine (COGENTIN) tablet 0.5 mg  0.5 mg Oral BID PRN Ethelene Hal, NP       Or   benztropine mesylate (COGENTIN) injection 0.5 mg  0.5 mg Intramuscular BID PRN Ethelene Hal, NP   0.5 mg at 09/15/21 2122   clonazePAM (KLONOPIN) tablet 0.5 mg  0.5 mg Oral BID Suella Broad, FNP   0.5 mg at  09/23/21 0901   dextrose 5 % and 0.9 % NaCl with KCl 20 mEq/L infusion   Intravenous Continuous Wendee Beavers T, MD 100 mL/hr at 09/23/21 1502 New Bag at 09/23/21 1502   dextrose 50 % solution 50 mL  1 ampule Intravenous PRN Swayze, Ava, DO   50 mL at 09/16/21 0819   enoxaparin (LOVENOX) injection 30 mg  30 mg Subcutaneous Daily Wendee Beavers T, MD   30 mg at 09/23/21 0902   feeding supplement (BOOST / RESOURCE BREEZE) liquid 1 Container  1 Container Oral TID BM Gonfa, Taye T, MD       feeding supplement (ENSURE ENLIVE / ENSURE PLUS) liquid 237 mL  237 mL Oral BID BM Tu, Ching T, DO       feeding supplement (OSMOLITE 1.2 CAL) liquid 1,000 mL  1,000 mL Per Tube Continuous Gonfa, Taye T, MD       feeding supplement (PROSource TF) liquid 45 mL  45 mL Per Tube Daily Gonfa, Taye T, MD       free water 100 mL  100 mL Per Tube Q6H Gonfa, Taye T, MD       glucagon (human recombinant) (GLUCAGEN) injection 0.5 mg  0.5 mg Intramuscular Once Wendee Beavers T, MD       haloperidol (HALDOL) tablet 5 mg  5 mg Oral QHS [name], Margaret A   5 mg at 09/22/21 2103   Or   haloperidol lactate (HALDOL) injection 5 mg  5 mg Intramuscular QHS [name], Margaret A   5 mg at 09/18/21 2140   LORazepam (ATIVAN) injection 2 mg  2 mg Intramuscular Q6H PRN Ethelene Hal, NP   2 mg at 09/15/21 2122   megestrol (MEGACE) tablet 20 mg  20 mg Oral BID Suella Broad, FNP   20 mg at 09/22/21 0957   multivitamin with minerals tablet 1 tablet  1 tablet Oral Daily Wendee Beavers T, MD   1 tablet at 09/22/21 0957   sodium chloride flush (NS) 0.9 % injection 3 mL  3 mL Intravenous Q12H Regan Lemming, MD   3 mL at 09/23/21 0932   sodium chloride flush (NS) 0.9 % injection 3 mL  3 mL Intravenous PRN Regan Lemming, MD       thiamine (B-1) injection 100 mg  100 mg Intravenous Daily Wendee Beavers T, MD   100 mg at 09/23/21 0859   traZODone (DESYREL) tablet 50 mg  50 mg Oral QHS PRN Lajean Saver, MD        Allergies as of  09/12/2021 - Review Complete 10/05/2020  Allergen Reaction Noted   Aspirin Other (See Comments) 11/04/2011   Geodon [ziprasidone hcl] Other (See Comments)  04/12/2013    No family history on file.  Social History   Socioeconomic History   Marital status: Single    Spouse name: Not on file   Number of children: Not on file   Years of education: Not on file   Highest education level: Not on file  Occupational History   Not on file  Tobacco Use   Smoking status: Some Days    Packs/day: 0.50    Types: Cigarettes   Smokeless tobacco: Never  Vaping Use   Vaping Use: Every day  Substance and Sexual Activity   Alcohol use: Not Currently    Comment: Refuses to disclose how much   Drug use: No    Comment: Refuses to answer   Sexual activity: Never    Comment: refused to answer  Other Topics Concern   Not on file  Social History Narrative   Not on file   Social Determinants of Health   Financial Resource Strain: Not on file  Food Insecurity: Not on file  Transportation Needs: Not on file  Physical Activity: Not on file  Stress: Not on file  Social Connections: Not on file  Intimate Partner Violence: Not on file    Review of Systems: Partial ROS done. Patient was not cooperative / forthcoming with information  OBJECTIVE    Physical Exam: Vital signs in last 24 hours: Temp:  [97.4 F (36.3 C)-98.2 F (36.8 C)] 97.4 F (36.3 C) (03/07 1432) Pulse Rate:  [53-58] 53 (03/07 1432) Resp:  [16-18] 18 (03/07 1432) BP: (106-113)/(72-74) 110/72 (03/07 1432) SpO2:  [97 %-100 %] 100 % (03/07 1432) Last BM Date :  (UTA)  Physical exam not performed:  General:  Alert thin female in NAD Psych:  sitting in bed, closed off body language. Poor eye contact.    Scheduled inpatient medications  clonazePAM  0.5 mg Oral BID   enoxaparin (LOVENOX) injection  30 mg Subcutaneous Daily   feeding supplement  1 Container Oral TID BM   feeding supplement  237 mL Oral BID BM   feeding  supplement (PROSource TF)  45 mL Per Tube Daily   free water  100 mL Per Tube Q6H   glucagon (human recombinant)  0.5 mg Intramuscular Once   haloperidol  5 mg Oral QHS   Or   haloperidol lactate  5 mg Intramuscular QHS   megestrol  20 mg Oral BID   multivitamin with minerals  1 tablet Oral Daily   sodium chloride flush  3 mL Intravenous Q12H   thiamine injection  100 mg Intravenous Daily    Lab Results: Recent Labs    09/22/21 1756  WBC 4.5  HGB 13.2  HCT 39.7  PLT 172   BMET Recent Labs    09/22/21 1756  NA 135  K 3.5  CL 106  CO2 24  GLUCOSE 113*  BUN <5*  CREATININE 0.57  CALCIUM 8.8*   LFTs Recent Labs    09/22/21 1756  PROT 6.8  ALBUMIN 3.7  AST 18  ALT 11  ALKPHOS 50  BILITOT 0.2*    . CBC Latest Ref Rng & Units 09/22/2021 09/19/2021 09/17/2021  WBC 4.0 - 10.5 K/uL 4.5 12.9(H) 4.4  Hemoglobin 12.0 - 15.0 g/dL 13.2 13.6 13.4  Hematocrit 36.0 - 46.0 % 39.7 41.8 40.4  Platelets 150 - 400 K/uL 172 198 178    . CMP Latest Ref Rng & Units 09/22/2021 09/20/2021 09/19/2021  Glucose 70 - 99 mg/dL 113(H) 125(H) 169(H)  BUN 6 - 20 mg/dL <5(L) 16 20  Creatinine 0.44 - 1.00 mg/dL 0.57 0.80 0.98  Sodium 135 - 145 mmol/L 135 131(L) 140  Potassium 3.5 - 5.1 mmol/L 3.5 3.1(L) 3.8  Chloride 98 - 111 mmol/L 106 102 106  CO2 22 - 32 mmol/L 24 22 14(L)  Calcium 8.9 - 10.3 mg/dL 8.8(L) 8.8(L) 9.1  Total Protein 6.5 - 8.1 g/dL 6.8 - 7.6  Total Bilirubin 0.3 - 1.2 mg/dL 0.2(L) - 0.7  Alkaline Phos 38 - 126 U/L 50 - 52  AST 15 - 41 U/L 18 - 35  ALT 0 - 44 U/L 11 - 14   Tye Savoy, NP-C @  09/23/2021, 3:34 PM   _______________________________________________________________________________________________________________________________________  [facility] MD note:  I personally examined the patient, reviewed the data and agree with the assessment and plan described above.  I provided a substantive portion of the care of this patient (personally provided more than half  of the total time dedicated to the treatment of this patient).  I've been asked to perform an EGD and place an NG tube for this woman who has been involuntarily committed for psychosis schizophrenia. She is not eating, not taking meds.  Medical and psych team has tried to have a feeding tube placed but IR unwilling, RNs unable. There is some concern she may have an obstruction in her GI tract limiting NG placement. She has no dysphagia.  No abd pains, really no GI symptoms at all.    I spoke with her sister who has medical POA.  She agrees to EGD and hopes that if no obstruction is noted that I can place NG tube while she is sedated.  She completely understands that the patient will almost certainly need to be mechanically restrained to keep her from pulling out the NG tube and she agrees to restraints if they are needed..  The patient does not want to undergo an EGD, does not want an NG tube placed and has told us that she will pull out the NG tube the first chance she gets.  I have no reason to doubt her.  I am planning EGD +/- NG tube placement on Thursday morning (first available time in endo unit), her medical doctor has requested this and her DPOA has agreed and will sign consent when needed.   Owens Loffler, MD [facility] Gastroenterology Pager [phone]

## 2021-09-23 NOTE — Progress Notes (Signed)
PROGRESS NOTE  [name] ION:[id number] DOB: [birth year]   PCP: [facility]  Patient is from: Home  DOA: 09/12/2021 LOS: 7  Chief complaints:  Chief Complaint  Patient presents with   Psychiatric Evaluation     Brief Narrative / Interim history: 61 year old F with PMH of paranoid schizophrenia who was presented to ED on 09/12/2021 under IVC from family due to not taking her meds, auditory hallucination and delusional thoughts.  She was in ED waiting on inpatient psychiatric hospitalization but became hypoglycemic due to refusal to eat or drink, and admitted for hypoglycemia.  She was started on D5 infusion but removing IV lines and IV replacement and oral intake.    Patient is evaluated by psychiatry.  She was deemed to lack capacity.  Legal guardian, sister consenting for treatment and interventions.   Patient was started on Haldol without significant improvement in her paranoia.  Continued to refuse IV placement and oral intake.  Only able to administer IM glucagon for recurrent hypoglycemia.  The decision was made for nasogastric feeding tube and soft restraints on 09/19/2021 after discussion with legal guardian. Unfortunately, ICU RN not able to advance small bore NGT.  General surgery, [facility] and IR recommended consulting radiology for fluoroscopy guided NG tube placement by radiology.  Radiology deferred NG tube placement on this patient with restraints, and recommended placement under sedation or general anesthesia.  Hospital CMO and ethics involved. Finally, [facility] consulted. Plan for EGD on 3/9. GI to attempt NGT during EGD.    Subjective: Seen and examined earlier this morning.  No major events overnight of this morning.  Continues to refuse to eat or drink.  Has been taking p.o. medication without water.  She says she does not want to drink water.  She says she went "H2O".   Objective: Vitals:   09/22/21 1421 09/22/21 2126 09/23/21 0559 09/23/21 1432  BP: 118/76  113/73 106/74 110/72  Pulse: 66 (!) 54 (!) 58 (!) 53  Resp: 15 16 18 18   Temp: 98.1 F (36.7 C) 98.2 F (36.8 C) (!) 97.5 F (36.4 C) (!) 97.4 F (36.3 C)  TempSrc: Oral Oral Oral Oral  SpO2: 99% 97% 97% 100%  Weight:      Height:        Examination:  GENERAL: Appears frail.  Nontoxic. HEENT: MMM.  Vision and hearing grossly intact.  NECK: Supple.  No apparent JVD.  RESP:  No IWOB.  Fair aeration bilaterally. CVS:  RRR. Heart sounds normal.  ABD/GI/GU: BS+. Abd soft, NTND.  MSK/EXT: Significant muscle mass and subcu fat loss.  Bilateral wrist restraints. SKIN: no apparent skin lesion or wound NEURO: Awake and alert. Oriented fairly.  No apparent focal neuro deficit. PSYCH: Calm.  Delusional  Procedures:  None  Microbiology summarized: COVID-19 and influenza PCR nonreactive.  Assessment and Plan: * Recurrent hypoglycemia and severe malnutrition Patient was brought to the hospital under IVC by legal guardian, sister due to paranoia and delusion.  Hypoglycemia due to refusal of oral intake while in ED requiring admission..Body mass index is 15.89 kg/m.  Continues to refuse food and water.  Has been refusing other interventions as well.  Prealbumin 7.6. Evaluated by psych and deemed to lack capacity and met criteria for medication over objection and inpatient psychiatric hospitalization.  Legal guardian provided consent for treatment and intervention including restraints and NGT placement for feeding.. -IV line replaced with restraints on 3/3.  -Continue D5-NS-KCl given hyponatremia and hypokalemia -Continue restraints to maintain  IV line and IV medications -Bedside NG tube placement by ICU RN unsuccessful.   -General surgery, [facility] and IR deferred NGT placement to radiology under fluoro -Fluoro radiology concern about placing NGT in uncooperative patient under soft restraints -Hospital CMO and ethics consulted, and IR reengaged but deferred to other experts -[facility]  consulted.  Plan for EGD and NGT on 3/9.  Add hand control double security posey mitts to restraints once NGT placed. -Psych following-long-acting Haldol injection on 3/3.  Added Klonopin.  Continue p.o. Haldol -Continue IV thiamine and Megace -Dietitian recommendation Nutrition Problem: Inadequate oral intake Etiology: chronic illness (paranoid schizophrenia) Signs/Symptoms: meal completion < 25% (refusing PO intakes) Interventions: Ensure Enlive (each supplement provides 350kcal and 20 grams of protein), Boost Breeze, MVI  Schizophrenia, paranoid (H[facility]) Patient brought to the hospital under IVC by family members.  IVC renewed on 3/3.  Deemed to lack capacity and met criteria for medication over objection and inpatient psychiatric hospitalization by psychiatry. -Had long-acting Haldol injection on 3/3.  She is also on IM/p.o. Haldol -Psych added Klonopin. -Cogentin and Ativan as needed -Continue soft restraints. -Follow further recommendation by psych.   Metabolic acidosis Resolved.  Hypokalemia, hypomagnesemia and hypophosphatemia Resolved. -Continue monitoring -Needs close monitoring once tube feed placed and she starts TF  Hyponatremia Continue D5-NS with KCl   DVT prophylaxis:  enoxaparin (LOVENOX) injection 30 mg Start: 09/18/21 1000  Code Status: Full code Family Communication: Updated patient's sister, legal guardian over the phone. Level of care: Med-Surg Status is: Inpatient Remains inpatient appropriate because: Recurrent hypoglycemia and schizophrenia     Final disposition: Inpatient psychiatric hospital      Consultants:  Psychiatry General surgery GI IR Radiology   Sch Meds:  Scheduled Meds:  clonazePAM  0.5 mg Oral BID   enoxaparin (LOVENOX) injection  30 mg Subcutaneous Daily   feeding supplement  1 Container Oral TID BM   feeding supplement  237 mL Oral BID BM   feeding supplement (PROSource TF)  45 mL Per Tube Daily   free water  100 mL  Per Tube Q6H   glucagon (human recombinant)  0.5 mg Intramuscular Once   haloperidol  5 mg Oral QHS   Or   haloperidol lactate  5 mg Intramuscular QHS   mirtazapine  7.5 mg Oral QHS   multivitamin with minerals  1 tablet Oral Daily   sodium chloride flush  3 mL Intravenous Q12H   thiamine injection  100 mg Intravenous Daily   Continuous Infusions:  sodium chloride     dextrose 5 % and 0.9 % NaCl with KCl 20 mEq/L 100 mL/hr at 09/23/21 1502   feeding supplement (OSMOLITE 1.2 CAL)     PRN Meds:.sodium chloride, benztropine **OR** benztropine mesylate, dextrose, [DISCONTINUED] LORazepam **OR** LORazepam, sodium chloride flush, traZODone  Antimicrobials: Anti-infectives (From admission, onward)    None        I have personally reviewed the following labs and images: CBC: Recent Labs  Lab 09/17/21 0518 09/19/21 1451 09/22/21 1756  WBC 4.4 12.9* 4.5  NEUTROABS 2.9  --   --   HGB 13.4 13.6 13.2  HCT 40.4 41.8 39.7  MCV 85.8 88.6 86.3  PLT 178 198 172   BMP &GFR Recent Labs  Lab 09/17/21 0518 09/19/21 1451 09/19/21 1817 09/20/21 0539 09/20/21 2149 09/22/21 1756 09/23/21 0625 09/23/21 1704  NA 136 140  --  131*  --  135  --   --   K 3.4* 3.8  --  3.1*  --  3.5  --   --   CL 105 106  --  102  --  106  --   --   CO2 25 14*  --  22  --  24  --   --   GLUCOSE 97 169*  --  125*  --  113*  --   --   BUN 7 20  --  16  --  <5*  --   --   CREATININE 0.75 0.98  --  0.80  --  0.57  --   --   CALCIUM 8.9 9.1  --  8.8*  --  8.8*  --   --   MG 2.0  --    < > 2.2 1.9 1.9 1.9 1.9  PHOS  --   --    < > 2.8   2.8 1.9* 2.9 3.9 3.2   < > = values in this interval not displayed.   Estimated Creatinine Clearance: 49.6 mL/min (by C-G formula based on SCr of 0.57 mg/dL). Liver & Pancreas: Recent Labs  Lab 09/19/21 1451 09/20/21 0539 09/22/21 1756  AST 35  --  18  ALT 14  --  11  ALKPHOS 52  --  50  BILITOT 0.7  --  0.2*  PROT 7.6  --  6.8  ALBUMIN 4.3 3.9 3.7   No results  for input(s): LIPASE, AMYLASE in the last 168 hours. No results for input(s): AMMONIA in the last 168 hours. Diabetic: No results for input(s): HGBA1C in the last 72 hours. Recent Labs  Lab 09/23/21 0045 09/23/21 0324 09/23/21 0726 09/23/21 1135 09/23/21 1742  GLUCAP 102* 90 111* 110* 100*   Cardiac Enzymes: No results for input(s): CKTOTAL, CKMB, CKMBINDEX, TROPONINI in the last 168 hours. No results for input(s): PROBNP in the last 8760 hours. Coagulation Profile: No results for input(s): INR, PROTIME in the last 168 hours. Thyroid Function Tests: No results for input(s): TSH, T4TOTAL, FREET4, T3FREE, THYROIDAB in the last 72 hours. Lipid Profile: No results for input(s): CHOL, HDL, LDLCALC, TRIG, CHOLHDL, LDLDIRECT in the last 72 hours. Anemia Panel: No results for input(s): VITAMINB12, FOLATE, FERRITIN, TIBC, IRON, RETICCTPCT in the last 72 hours. Urine analysis:    Component Value Date/Time   COLORURINE STRAW (A) 03/25/2020 1608   APPEARANCEUR CLEAR 03/25/2020 1608   LABSPEC 1.004 (L) 03/25/2020 1608   PHURINE 6.0 03/25/2020 1608   GLUCOSEU NEGATIVE 03/25/2020 1608   HGBUR SMALL (A) 03/25/2020 1608   BILIRUBINUR NEGATIVE 03/25/2020 1608   KETONESUR 5 (A) 03/25/2020 1608   PROTEINUR NEGATIVE 03/25/2020 1608   UROBILINOGEN 0.2 04/10/2015 1111   NITRITE NEGATIVE 03/25/2020 1608   LEUKOCYTESUR NEGATIVE 03/25/2020 1608   Sepsis Labs: Invalid input(s): PROCALCITONIN, LACTICIDVEN  Microbiology: No results found for this or any previous visit (from the past 240 hour(s)).   Radiology Studies: No results found.    Estanislao Harmon T. Reni Hausner [facility]  If 7PM-7AM, please contact night-coverage [email or web address] 09/23/2021, 7:40 PM

## 2021-09-23 NOTE — Consult Note (Signed)
?  [name] is a 61 year old female who presented to [facility] emergency department under involuntary commitment on 09/12/2021, by her legal guardian with complaints of being a danger to self and others. ? ?Patient has shown no progress in terms of her psychosis and paranoia.  She continues to refuse to participate in all treatments to include lab draws, feeding tube, oral medication and eating.   ? ?Patient did present with some noted improvement in conversation today.  She is much more talkative, and not as irritable on evaluation.  Although she remains floridly psychotic, paranoid, and delusional she is engaging in conversation today.  She continues to deny eating and has no interest in eating, and states she will only eat at home.  Discussed with patient several times she will need to eat prior to discharging home, she continues to refuse. ? ? Patient did receive a long-acting injectable on Friday, March 3, has displayed no improvement in clinical presentation or the psychiatric symptoms. ? ?-Will continue with previous recommendations from nutritionist, [facility]. ? ?-Will start mirtazapine 7.5 mg p.o. nightly for depression, anxiety, appetite, and insomnia. ? ?-Will also add a standing dose of benzodiazepine to help manage agitation. ?-Will continue with oral Haldol for a few more days. ? ?-Psychiatry will continue to follow at this time.  Patient continues to meet inpatient psychiatric criteria, once medically stable. ? ?

## 2021-09-24 DIAGNOSIS — E162 Hypoglycemia, unspecified: Secondary | ICD-10-CM | POA: Diagnosis not present

## 2021-09-24 LAB — PHOSPHORUS
Phosphorus: 4.4 mg/dL (ref 2.5–4.6)
Phosphorus: 3.7 mg/dL (ref 2.5–4.6)

## 2021-09-24 LAB — GLUCOSE, CAPILLARY
Glucose-Capillary: 110 mg/dL — ABNORMAL HIGH (ref 70–99)
Glucose-Capillary: 87 mg/dL (ref 70–99)
Glucose-Capillary: 91 mg/dL (ref 70–99)
Glucose-Capillary: 92 mg/dL (ref 70–99)
Glucose-Capillary: 92 mg/dL (ref 70–99)
Glucose-Capillary: 94 mg/dL (ref 70–99)

## 2021-09-24 LAB — CREATININE, SERUM
Creatinine, Ser: 0.68 mg/dL (ref 0.44–1.00)
GFR, Estimated: >60 mL/min (ref >60)

## 2021-09-24 LAB — MAGNESIUM
Magnesium: 2 mg/dL (ref 1.7–2.4)
Magnesium: 2.1 mg/dL (ref 1.7–2.4)

## 2021-09-24 NOTE — Progress Notes (Signed)
PROGRESS NOTE  [name] QHU:[id number] DOB: [birth year] DOA: 09/12/2021 PCP: [facility]  HPI/Recap of past 42 hours: 61 year old F with PMH of paranoid schizophrenia who was presented to ED on 09/12/2021 under IVC from family due to not taking her meds, auditory hallucination and delusional thoughts.  She was in ED waiting on inpatient psychiatric hospitalization but became hypoglycemic due to refusal to eat or drink, and admitted for hypoglycemia.  She was started on D5 infusion but removing IV lines and IV replacement and oral intake.     Patient is evaluated by psychiatry.  She was deemed to lack capacity.  Legal guardian, sister consenting for treatment and interventions.   Patient was started on Haldol without significant improvement in her paranoia.  Continued to refuse IV placement and oral intake.  Only able to administer IM glucagon for recurrent hypoglycemia.  The decision was made for nasogastric feeding tube and soft restraints on 09/19/2021 after discussion with legal guardian. Unfortunately, ICU RN not able to advance small bore NGT.  General surgery, [facility] and IR recommended consulting radiology for fluoroscopy guided NG tube placement by radiology.  Radiology deferred NG tube placement on this patient with restraints, and recommended placement under sedation or general anesthesia.  Hospital CMO and ethics involved. Finally, [facility] consulted. Plan for EGD on 3/9. GI to attempt NGT during EGD.  09/24/21: Patient was seen and examined at her bedside.  One-to-one sitter in the room.  She states she does not want to eat.  IV fluid in place D5 normal saline with KCl 20 mEq at 100 cc/h.  Assessment/Plan: Principal Problem:   Recurrent hypoglycemia and severe malnutrition Active Problems:   Protein-calorie malnutrition, severe (HCC)   Schizophrenia, paranoid (HCC)   Hypokalemia, hypomagnesemia and hypophosphatemia   Metabolic acidosis   Hyponatremia   Hypophosphatemia    Hypomagnesemia   Hypokalemia  Recurrent hypoglycemia and severe malnutrition Patient was brought to the hospital under IVC by legal guardian, sister due to paranoia and delusion.  Hypoglycemia due to refusal of oral intake while in ED requiring admission..Body mass index is 15.89 kg/m.  Continues to refuse food and water.  Has been refusing other interventions as well.  Prealbumin 7.6. Evaluated by psych and deemed to lack capacity and met criteria for medication over objection and inpatient psychiatric hospitalization.  Legal guardian provided consent for treatment and intervention including restraints and NGT placement for feeding.. -IV line replaced with restraints on 3/3.  -Continue D5-NS-KCl given hyponatremia and hypokalemia -Continue restraints to maintain IV line and IV medications -Bedside NG tube placement by ICU RN unsuccessful.   -General surgery, [facility] and IR deferred NGT placement to radiology under fluoro -Fluoro radiology concern about placing NGT in uncooperative patient under soft restraints -Hospital CMO and ethics consulted, and IR reengaged but deferred to other experts -[facility] consulted.  Plan for EGD and NGT on 3/9.  Add hand control double security posey mitts to restraints once NGT placed. -Psych following-long-acting Haldol injection on 3/3.  Added Klonopin.  Continue p.o. Haldol -Continue IV thiamine and Megace -Dietitian recommendation Nutrition Problem: Inadequate oral intake Etiology: chronic illness (paranoid schizophrenia) Signs/Symptoms: meal completion < 25% (refusing PO intakes) Interventions: Ensure Enlive (each supplement provides 350kcal and 20 grams of protein), Boost Breeze, MVI   Schizophrenia, paranoid ([place]) Patient brought to the hospital under IVC by family members.  IVC renewed on 3/3.  Deemed to lack capacity and met criteria for medication over objection and inpatient psychiatric hospitalization  by psychiatry. -Had long-acting Haldol  injection on 3/3.  She is also on IM/p.o. Haldol -Psych added Klonopin. -Cogentin and Ativan as needed -Continue soft restraints. -Follow further recommendation by psych.    Metabolic acidosis Resolved.   Hypokalemia, hypomagnesemia and hypophosphatemia Resolved. -Continue monitoring -Needs close monitoring once tube feed placed and she starts TF   Hyponatremia Continue D5-NS with KCl     DVT prophylaxis:  enoxaparin (LOVENOX) injection 30 mg Start: 09/18/21 1000   Code Status: Full code Family Communication: Updated patient's sister, legal guardian over the phone. Level of care: Med-Surg Status is: Inpatient Remains inpatient appropriate because: Recurrent hypoglycemia and schizophrenia         Final disposition: Inpatient psychiatric hospital           Consultants:  Psychiatry General surgery GI IR Radiology       Status is: Inpatient Patient requires at least 2 midnights for further evaluation and treatment of present condition.    Objective: Vitals:   09/23/21 0559 09/23/21 1432 09/23/21 2100 09/24/21 1240  BP: 106/74 110/72 120/74 116/72  Pulse: (!) 58 (!) 53 84 (!) 45  Resp: $Remo'18 18 18 16  'FuKHE$ Temp: (!) 97.5 F (36.4 C) (!) 97.4 F (36.3 C) 97.7 F (36.5 C) 98.3 F (36.8 C)  TempSrc: Oral Oral Oral Oral  SpO2: 97% 100% 100% 100%  Weight:      Height:        Intake/Output Summary (Last 24 hours) at 09/24/2021 1333 Last data filed at 09/23/2021 1800 Gross per 24 hour  Intake 1217 ml  Output --  Net 1217 ml   Filed Weights   09/15/21 2325 09/20/21 0500  Weight: 42.4 kg 42 kg    Exam:  General: 61 y.o. year-old female frail-appearing in no acute distress.  Alert. Cardiovascular: Regular rate and rhythm with no rubs or gallops.  No thyromegaly or JVD noted.   Respiratory: Clear to auscultation with no wheezes or rales.  Abdomen: Soft nontender nondistended with bowel sounds. Musculoskeletal: No lower extremity edema. Skin: No ulcerative  lesions noted or rashes, Psychiatry: Flat affect.   Data Reviewed: CBC: Recent Labs  Lab 09/19/21 1451 09/22/21 1756  WBC 12.9* 4.5  HGB 13.6 13.2  HCT 41.8 39.7  MCV 88.6 86.3  PLT 198 701   Basic Metabolic Panel: Recent Labs  Lab 09/19/21 1451 09/19/21 1817 09/20/21 0539 09/20/21 2149 09/22/21 1756 09/23/21 0625 09/23/21 1704 09/24/21 0610  NA 140  --  131*  --  135  --   --   --   K 3.8  --  3.1*  --  3.5  --   --   --   CL 106  --  102  --  106  --   --   --   CO2 14*  --  22  --  24  --   --   --   GLUCOSE 169*  --  125*  --  113*  --   --   --   BUN 20  --  16  --  <5*  --   --   --   CREATININE 0.98  --  0.80  --  0.57  --   --  0.68  CALCIUM 9.1  --  8.8*  --  8.8*  --   --   --   MG  --    < > 2.2 1.9 1.9 1.9 1.9 2.0  PHOS  --    < >  2.8   2.8 1.9* 2.9 3.9 3.2 4.4   < > = values in this interval not displayed.   GFR: Estimated Creatinine Clearance: 49.6 mL/min (by C-G formula based on SCr of 0.68 mg/dL). Liver Function Tests: Recent Labs  Lab 09/19/21 1451 09/20/21 0539 09/22/21 1756  AST 35  --  18  ALT 14  --  11  ALKPHOS 52  --  50  BILITOT 0.7  --  0.2*  PROT 7.6  --  6.8  ALBUMIN 4.3 3.9 3.7   No results for input(s): LIPASE, AMYLASE in the last 168 hours. No results for input(s): AMMONIA in the last 168 hours. Coagulation Profile: No results for input(s): INR, PROTIME in the last 168 hours. Cardiac Enzymes: No results for input(s): CKTOTAL, CKMB, CKMBINDEX, TROPONINI in the last 168 hours. BNP (last 3 results) No results for input(s): PROBNP in the last 8760 hours. HbA1C: No results for input(s): HGBA1C in the last 72 hours. CBG: Recent Labs  Lab 09/23/21 2157 09/24/21 0005 09/24/21 0400 09/24/21 0755 09/24/21 1157  GLUCAP 109* 110* 92 91 87   Lipid Profile: No results for input(s): CHOL, HDL, LDLCALC, TRIG, CHOLHDL, LDLDIRECT in the last 72 hours. Thyroid Function Tests: No results for input(s): TSH, T4TOTAL, FREET4, T3FREE,  THYROIDAB in the last 72 hours. Anemia Panel: No results for input(s): VITAMINB12, FOLATE, FERRITIN, TIBC, IRON, RETICCTPCT in the last 72 hours. Urine analysis:    Component Value Date/Time   COLORURINE STRAW (A) 03/25/2020 1608   APPEARANCEUR CLEAR 03/25/2020 1608   LABSPEC 1.004 (L) 03/25/2020 1608   PHURINE 6.0 03/25/2020 1608   GLUCOSEU NEGATIVE 03/25/2020 1608   HGBUR SMALL (A) 03/25/2020 1608   BILIRUBINUR NEGATIVE 03/25/2020 1608   KETONESUR 5 (A) 03/25/2020 1608   PROTEINUR NEGATIVE 03/25/2020 1608   UROBILINOGEN 0.2 04/10/2015 1111   NITRITE NEGATIVE 03/25/2020 1608   LEUKOCYTESUR NEGATIVE 03/25/2020 1608   Sepsis Labs: $RemoveBefo'@[email or web address]'optUvNlbovH$ (procalcitonin:4,lacticidven:4)  )No results found for this or any previous visit (from the past 240 hour(s)).    Studies: No results found.  Scheduled Meds:  clonazePAM  0.5 mg Oral BID   enoxaparin (LOVENOX) injection  30 mg Subcutaneous Daily   feeding supplement  1 Container Oral TID BM   feeding supplement  237 mL Oral BID BM   glucagon (human recombinant)  0.5 mg Intramuscular Once   mirtazapine  7.5 mg Oral QHS   multivitamin with minerals  1 tablet Oral Daily   thiamine injection  100 mg Intravenous Daily    Continuous Infusions:  dextrose 5 % and 0.9 % NaCl with KCl 20 mEq/L 100 mL/hr at 09/24/21 1241     LOS: 8 days     Kayleen Memos, MD [facility] Pager [phone]  If 7PM-7AM, please contact night-coverage [email or web address] Password [email or web address] 09/24/2021, 1:33 PM

## 2021-09-24 NOTE — Plan of Care (Signed)
?  Problem: Health Behavior/Discharge Planning: ?Goal: Ability to manage health-related needs will improve ?Outcome: Progressing ?  ?Problem: Clinical Measurements: ?Goal: Ability to maintain clinical measurements within normal limits will improve ?Outcome: Progressing ?Goal: Will remain free from infection ?Outcome: Progressing ?Goal: Diagnostic test results will improve ?Outcome: Progressing ?Goal: Respiratory complications will improve ?Outcome: Progressing ?Goal: Cardiovascular complication will be avoided ?Outcome: Progressing ?  ?Problem: Activity: ?Goal: Risk for activity intolerance will decrease ?Outcome: Progressing ?  ?Problem: Nutrition: ?Goal: Adequate nutrition will be maintained ?Outcome: Progressing ?  ?Problem: Coping: ?Goal: Level of anxiety will decrease ?Outcome: Progressing ?  ?Problem: Elimination: ?Goal: Will not experience complications related to bowel motility ?Outcome: Progressing ?Goal: Will not experience complications related to urinary retention ?Outcome: Progressing ?  ?Problem: Pain Managment: ?Goal: General experience of comfort will improve ?Outcome: Progressing ?  ?Problem: Safety: ?Goal: Ability to remain free from injury will improve ?Outcome: Progressing ?  ?Problem: Skin Integrity: ?Goal: Risk for impaired skin integrity will decrease ?Outcome: Progressing ?  ?Problem: Safety: ?Goal: Non-violent Restraint(s) ?Outcome: Progressing ?  ?

## 2021-09-24 NOTE — H&P (View-Only) (Signed)
[facility] Progress Note ? ? ? ?Since last GI note: ?She told me this morning that she feels fine, no complaints. ? ?I explained that I spoke with legal guardian (whom I understand is her sister as well) to which she replied "I don't have a sister" ? ?Objective: ?Vital signs in last 24 hours: ?Temp:  [97.4 ?F (36.3 ?C)-97.7 ?F (36.5 ?C)] 97.7 ?F (36.5 ?C) (03/07 2100) ?Pulse Rate:  [53-84] 84 (03/07 2100) ?Resp:  [18] 18 (03/07 2100) ?BP: (110-120)/(72-74) 120/74 (03/07 2100) ?SpO2:  [100 %] 100 % (03/07 2100) ?Last BM Date :  (UTA) ?General: alert and oriented times 3 ?Heart: regular rate and rythm ? ?Lab Results: ?Recent Labs  ?  09/22/21 ?1756  ?WBC 4.5  ?HGB 13.2  ?PLT 172  ?MCV 86.3  ? ?Recent Labs  ?  09/22/21 ?1756 09/24/21 ?0610  ?NA 135  --   ?K 3.5  --   ?CL 106  --   ?CO2 24  --   ?GLUCOSE 113*  --   ?BUN <5*  --   ?CREATININE 0.57 0.68  ?CALCIUM 8.8*  --   ? ?Recent Labs  ?  09/22/21 ?1756  ?PROT 6.8  ?ALBUMIN 3.7  ?AST 18  ?ALT 11  ?ALKPHOS 50  ?BILITOT 0.2*  ? ?Medications: ?Scheduled Meds: ? clonazePAM  0.5 mg Oral BID  ? enoxaparin (LOVENOX) injection  30 mg Subcutaneous Daily  ? feeding supplement  1 Container Oral TID BM  ? feeding supplement  237 mL Oral BID BM  ? feeding supplement (PROSource TF)  45 mL Per Tube Daily  ? free water  100 mL Per Tube Q6H  ? glucagon (human recombinant)  0.5 mg Intramuscular Once  ? mirtazapine  7.5 mg Oral QHS  ? multivitamin with minerals  1 tablet Oral Daily  ? sodium chloride flush  3 mL Intravenous Q12H  ? thiamine injection  100 mg Intravenous Daily  ? ?Continuous Infusions: ? sodium chloride    ? dextrose 5 % and 0.9 % NaCl with KCl 20 mEq/L 100 mL/hr at 09/23/21 1502  ? feeding supplement (OSMOLITE 1.2 CAL)    ? ?PRN Meds:.sodium chloride, benztropine **OR** benztropine mesylate, dextrose, [DISCONTINUED] LORazepam **OR** LORazepam, sodium chloride flush, traZODone ? ?Assessment/Plan: ?61 y.o. female with schizophrenia, psychosis who has been  committed involuntarily. She is declining meds, food. ? ?I was asked to examine her esophagus via EGD to see if she has a mechanical, obstructive issue given some of the difficulty staff has had attempting to place an NG tube.  I am happy to do that for her and she is on schedule for tomorrow morning and as long as no obstruction found I will attempt to place an NG tube.  Her primary team has asked that I do this, her DPOA has consented. ? ?She however does not want an EGD, does not want an NG and she has made it quite clear to me that she will pull the NGtube out at the first opportunity. I have no reason to doubt her. ? ?Her primary team and her DPOA understand all of the above and are planning to mechanically restrain her.  Please ensure that those restraints are available and authorized to be used after her EGD/NG tube placement.  ? ?To me we are in ethically murky waters however I see indication for EGD as reasonable (exclude obstruction) and the placement of NG also as reasonable and quite low risk even if she eventually pulls it out. I  will not take on the responsibility of ordering restraints after the procedure however. ? ?I am ordering NPO after MN tonight ? ?Less Woolsey P Lannette Avellino, MD  09/24/2021, 7:03 AM ?[facility] ?Pager [phone] ? ? ? ?

## 2021-09-24 NOTE — Progress Notes (Addendum)
[facility] Progress Note ? ? ? ?Since last GI note: ?She told me this morning that she feels fine, no complaints. ? ?I explained that I spoke with legal guardian (whom I understand is her sister as well) to which she replied "I don't have a sister" ? ?Objective: ?Vital signs in last 24 hours: ?Temp:  [97.4 ?F (36.3 ?C)-97.7 ?F (36.5 ?C)] 97.7 ?F (36.5 ?C) (03/07 2100) ?Pulse Rate:  [53-84] 84 (03/07 2100) ?Resp:  [18] 18 (03/07 2100) ?BP: (110-120)/(72-74) 120/74 (03/07 2100) ?SpO2:  [100 %] 100 % (03/07 2100) ?Last BM Date :  (UTA) ?General: alert and oriented times 3 ?Heart: regular rate and rythm ? ?Lab Results: ?Recent Labs  ?  09/22/21 ?1756  ?WBC 4.5  ?HGB 13.2  ?PLT 172  ?MCV 86.3  ? ?Recent Labs  ?  09/22/21 ?1756 09/24/21 ?0610  ?NA 135  --   ?K 3.5  --   ?CL 106  --   ?CO2 24  --   ?GLUCOSE 113*  --   ?BUN <5*  --   ?CREATININE 0.57 0.68  ?CALCIUM 8.8*  --   ? ?Recent Labs  ?  09/22/21 ?1756  ?PROT 6.8  ?ALBUMIN 3.7  ?AST 18  ?ALT 11  ?ALKPHOS 50  ?BILITOT 0.2*  ? ?Medications: ?Scheduled Meds: ? clonazePAM  0.5 mg Oral BID  ? enoxaparin (LOVENOX) injection  30 mg Subcutaneous Daily  ? feeding supplement  1 Container Oral TID BM  ? feeding supplement  237 mL Oral BID BM  ? feeding supplement (PROSource TF)  45 mL Per Tube Daily  ? free water  100 mL Per Tube Q6H  ? glucagon (human recombinant)  0.5 mg Intramuscular Once  ? mirtazapine  7.5 mg Oral QHS  ? multivitamin with minerals  1 tablet Oral Daily  ? sodium chloride flush  3 mL Intravenous Q12H  ? thiamine injection  100 mg Intravenous Daily  ? ?Continuous Infusions: ? sodium chloride    ? dextrose 5 % and 0.9 % NaCl with KCl 20 mEq/L 100 mL/hr at 09/23/21 1502  ? feeding supplement (OSMOLITE 1.2 CAL)    ? ?PRN Meds:.sodium chloride, benztropine **OR** benztropine mesylate, dextrose, [DISCONTINUED] LORazepam **OR** LORazepam, sodium chloride flush, traZODone ? ?Assessment/Plan: ?61 y.o. female with schizophrenia, psychosis who has been  committed involuntarily. She is declining meds, food. ? ?I was asked to examine her esophagus via EGD to see if she has a mechanical, obstructive issue given some of the difficulty staff has had attempting to place an NG tube.  I am happy to do that for her and she is on schedule for tomorrow morning and as long as no obstruction found I will attempt to place an NG tube.  Her primary team has asked that I do this, her DPOA has consented. ? ?She however does not want an EGD, does not want an NG and she has made it quite clear to me that she will pull the NGtube out at the first opportunity. I have no reason to doubt her. ? ?Her primary team and her DPOA understand all of the above and are planning to mechanically restrain her.  Please ensure that those restraints are available and authorized to be used after her EGD/NG tube placement.  ? ?To me we are in ethically murky waters however I see indication for EGD as reasonable (exclude obstruction) and the placement of NG also as reasonable and quite low risk even if she eventually pulls it out. I  will not take on the responsibility of ordering restraints after the procedure however. ? ?I am ordering NPO after MN tonight ? ?Rachael Fee, MD  09/24/2021, 7:03 AM ?[facility] ?Pager [phone]) [phone] ? ? ? ?

## 2021-09-25 ENCOUNTER — Inpatient Hospital Stay (HOSPITAL_COMMUNITY): Payer: Medicare Other | Admitting: Anesthesiology

## 2021-09-25 ENCOUNTER — Encounter (HOSPITAL_COMMUNITY): Payer: Self-pay | Admitting: Internal Medicine

## 2021-09-25 ENCOUNTER — Encounter (HOSPITAL_COMMUNITY)
Admission: EM | Disposition: A | Discharge status: Other Acute Inpatient Hospital | Payer: Self-pay | Source: Home / Self Care | Attending: Internal Medicine

## 2021-09-25 DIAGNOSIS — E162 Hypoglycemia, unspecified: Secondary | ICD-10-CM | POA: Diagnosis not present

## 2021-09-25 DIAGNOSIS — K2970 Gastritis, unspecified, without bleeding: Secondary | ICD-10-CM

## 2021-09-25 HISTORY — PX: ESOPHAGOGASTRODUODENOSCOPY (EGD) WITH PROPOFOL: SHX5813

## 2021-09-25 HISTORY — PX: BIOPSY: SHX5522

## 2021-09-25 LAB — COMPREHENSIVE METABOLIC PANEL
ALT: 13 U/L (ref 0–44)
AST: 18 U/L (ref 15–41)
Albumin: 3.2 g/dL — ABNORMAL LOW (ref 3.5–5.0)
Alkaline Phosphatase: 45 U/L (ref 38–126)
Anion gap: 4 — ABNORMAL LOW (ref 5–15)
BUN: <5 mg/dL — ABNORMAL LOW (ref 6–20)
CO2: 27 mmol/L (ref 22–32)
Calcium: 8.4 mg/dL — ABNORMAL LOW (ref 8.9–10.3)
Chloride: 105 mmol/L (ref 98–111)
Creatinine, Ser: 0.62 mg/dL (ref 0.44–1.00)
GFR, Estimated: >60 mL/min (ref >60)
Glucose, Bld: 101 mg/dL — ABNORMAL HIGH (ref 70–99)
Potassium: 3.8 mmol/L (ref 3.5–5.1)
Sodium: 136 mmol/L (ref 135–145)
Total Bilirubin: 0.3 mg/dL (ref 0.3–1.2)
Total Protein: 5.8 g/dL — ABNORMAL LOW (ref 6.5–8.1)

## 2021-09-25 LAB — CBC
HCT: 37 % (ref 36.0–46.0)
Hemoglobin: 12.4 g/dL (ref 12.0–15.0)
MCH: 28.9 pg (ref 26.0–34.0)
MCHC: 33.5 g/dL (ref 30.0–36.0)
MCV: 86.2 fL (ref 80.0–100.0)
Platelets: 168 10*3/uL (ref 150–400)
RBC: 4.29 MIL/uL (ref 3.87–5.11)
RDW: 12.5 % (ref 11.5–15.5)
WBC: 4.6 10*3/uL (ref 4.0–10.5)
nRBC: 0 % (ref 0.0–0.2)

## 2021-09-25 LAB — GLUCOSE, CAPILLARY
Glucose-Capillary: 95 mg/dL (ref 70–99)
Glucose-Capillary: 108 mg/dL — ABNORMAL HIGH (ref 70–99)
Glucose-Capillary: 116 mg/dL — ABNORMAL HIGH (ref 70–99)
Glucose-Capillary: 101 mg/dL — ABNORMAL HIGH (ref 70–99)

## 2021-09-25 LAB — PHOSPHORUS
Phosphorus: 4.2 mg/dL (ref 2.5–4.6)
Phosphorus: 4 mg/dL (ref 2.5–4.6)

## 2021-09-25 LAB — MAGNESIUM
Magnesium: 2.1 mg/dL (ref 1.7–2.4)
Magnesium: 2.3 mg/dL (ref 1.7–2.4)

## 2021-09-25 SURGERY — ESOPHAGOGASTRODUODENOSCOPY (EGD) WITH PROPOFOL
Anesthesia: General

## 2021-09-25 MED ORDER — PHENYLEPHRINE HCL (PRESSORS) 10 MG/ML IV SOLN
INTRAVENOUS | Status: AC
  Filled 2021-09-25: qty 1

## 2021-09-25 MED ORDER — LACTATED RINGERS IV SOLN: INTRAVENOUS | Status: DC | PRN

## 2021-09-25 MED ORDER — TRAZODONE HCL 50 MG PO TABS: 50.0000 mg | ORAL_TABLET | Freq: Every evening | ORAL | Status: DC | PRN

## 2021-09-25 MED ORDER — ADULT MULTIVITAMIN W/MINERALS CH
1.0000 | ORAL_TABLET | Freq: Every day | ORAL | Status: DC
  Administered 2021-09-26 – 2021-10-02 (×7): 1
  Filled 2021-09-25 (×7): qty 1

## 2021-09-25 MED ORDER — PROPOFOL 10 MG/ML IV BOLUS
INTRAVENOUS | Status: AC
  Filled 2021-09-25: qty 20

## 2021-09-25 MED ORDER — PROPOFOL 500 MG/50ML IV EMUL
INTRAVENOUS | Status: DC | PRN
  Administered 2021-09-25: 150 ug/kg/min via INTRAVENOUS

## 2021-09-25 MED ORDER — FREE WATER
120.0000 mL | Freq: Four times a day (QID) | Status: DC
  Administered 2021-09-25 – 2021-10-02 (×29): 120 mL

## 2021-09-25 MED ORDER — PROPOFOL 500 MG/50ML IV EMUL
INTRAVENOUS | Status: AC
  Filled 2021-09-25: qty 50

## 2021-09-25 MED ORDER — MIRTAZAPINE 15 MG PO TBDP
7.5000 mg | ORAL_TABLET | Freq: Every day | ORAL | Status: DC
  Administered 2021-09-25 – 2021-10-01 (×7): 7.5 mg
  Filled 2021-09-25 (×8): qty 0.5

## 2021-09-25 MED ORDER — EPHEDRINE SULFATE (PRESSORS) 50 MG/ML IJ SOLN
INTRAMUSCULAR | Status: DC | PRN
  Administered 2021-09-25: 5 mg via INTRAVENOUS

## 2021-09-25 MED ORDER — PROPOFOL 1000 MG/100ML IV EMUL
INTRAVENOUS | Status: AC
  Filled 2021-09-25: qty 200

## 2021-09-25 MED ORDER — OSMOLITE 1.2 CAL PO LIQD
1000.0000 mL | ORAL | Status: AC
  Administered 2021-09-25: 11:00:00 1000 mL

## 2021-09-25 MED ORDER — CLONAZEPAM 0.5 MG PO TABS
0.5000 mg | ORAL_TABLET | Freq: Two times a day (BID) | ORAL | Status: DC
  Administered 2021-09-25 – 2021-10-02 (×14): 0.5 mg
  Filled 2021-09-25 (×14): qty 1

## 2021-09-25 SURGICAL SUPPLY — 15 items

## 2021-09-25 NOTE — Progress Notes (Addendum)
Per Dr. Christella Hartigan, NG tube is ready to use. No KUB needed  ?

## 2021-09-25 NOTE — Consult Note (Signed)
?  [name] is a 61 year old female who presented to [facility] emergency department under involuntary commitment on 09/12/2021, by her legal guardian with complaints of being a danger to self and others. ? ?Patient has shown no progress in terms of her psychosis and paranoia.  Today she did have EGD performed, and NG tube was placed.  Patient did have tube feeds running during this evaluation. ? ?Patient did present with increased somnolence, likely related to anesthesia.  She presented today with flat affect and dysphoric mood.  ? ? Patient did receive a long-acting injectable on March 2, has displayed no improvement in clinical presentation or the psychiatric symptoms. ? ?-Will continue with previous recommendations from nutritionist, [facility]. ? ?-Will continue mirtazapine 7.5 mg p.o. nightly for depression, anxiety, appetite, and insomnia. ? ?-Continue Klonopin 1 mg p.o. twice daily. ?-Did initiate conversation with [facility], Dr. Toni Amend for ECT evaluation. ? ?-Psychiatry will continue to follow at this time.  Patient continues to meet inpatient psychiatric criteria, once medically stable. ? ? ?

## 2021-09-25 NOTE — Progress Notes (Signed)
Nutrition Follow-up ? ?DOCUMENTATION CODES:  ? ?Underweight ? ?INTERVENTION:  ? ?-Recommend monitor magnesium, potassium, and phosphorus BID, MD to replete as needed, as pt is at severe risk for refeeding syndrome. ? ?-Continue IV Thiamine ? ?Per NGT: ?-Initiate Osmolite 1.2 @ 20 ml/hr, advance by 10 ml every 48 hours to goal of 70 ml/hr. ?-Multivitamin with minerals via tube ?-Free water flushes of 120 ml QID ?-At goal this provides 2016 kcals, 93g protein and 1857 ml H2O ? ?If patient becomes more compliant, would recommend checking vitamin labs : Thiamine, iron, folate, Vitamin B-12, Vitamins D, A, K, C ? ?NUTRITION DIAGNOSIS:  ? ?Inadequate oral intake related to chronic illness (paranoid schizophrenia) as evidenced by meal completion < 25% (refusing PO intakes). ? ?Ongoing.  ? ?GOAL:  ? ?Patient will meet greater than or equal to 90% of their needs ? ?Not meeting. ? ?MONITOR:  ? ?PO intake, Supplement acceptance, Labs, Weight trends, I & O's ? ?REASON FOR ASSESSMENT:  ? ?Consult ?Enteral/tube feeding initiation and management ? ?ASSESSMENT:  ? ?61 y.o. female with medical history significant of schizophrenia who was brought into the ED on 2/24 with IVC from family indicating that she has not been taking her medications, hearing voices and having hallucination and delusional thoughts. ? ?Patient is s/p EGD this morning with NGT placed per GI.  ?Tube feeding was started today at 1100. Orders placed for trickle feeds of Osmolite 1.2 @ 20 ml/hr for the next 48 hours. Pt at severe refeeding syndrome risk. Will monitor labs for ability to advance. ?Over the next 24 hours pt should be provided ~576 kcals and 26g protein.  ? ?Will add free water as well. ? ?Admission weight: 93 lbs. ?Last recorded weight 3/4 & 3/9: 92 lbs ? ?Medications: Remeron, IV Thiamine, D5 infusion @ 100 (providing 408 kcals) ? ?Labs reviewed: ?CBGs: 92-108 ? ?Diet Order:   ?Diet Order   ? ?       ?  Diet NPO time specified  Diet effective ____        ?  ? ?  ?  ? ?  ? ? ?EDUCATION NEEDS:  ? ?Not appropriate for education at this time ? ?Skin:  Skin Assessment: Reviewed RN Assessment ? ?Last BM:  3/5 ? ?Height:  ? ?Ht Readings from Last 1 Encounters:  ?09/25/21 5\' 4"  (1.626 m)  ? ? ?Weight:  ? ?Wt Readings from Last 1 Encounters:  ?09/25/21 42 kg  ? ? ?BMI:  Body mass index is 15.89 kg/m?. ? ?Estimated Nutritional Needs:  ? ?Kcal:  1900-2100 ? ?Protein:  100-115g ? ?Fluid:  2L/day ? ?11/25/21, MS, RD, LDN ?Inpatient Clinical Dietitian ?Contact information available via Amion ? ?

## 2021-09-25 NOTE — Anesthesia Postprocedure Evaluation (Signed)
Anesthesia Post Note ? ?Patient: [name] ? ?Procedure(s) Performed: ESOPHAGOGASTRODUODENOSCOPY (EGD) WITH PROPOFOL ?BIOPSY ? ?  ? ?Patient location during evaluation: PACU ?Anesthesia Type: General ?Level of consciousness: awake and alert ?Pain management: pain level controlled ?Vital Signs Assessment: post-procedure vital signs reviewed and stable ?Respiratory status: spontaneous breathing, nonlabored ventilation, respiratory function stable and patient connected to nasal cannula oxygen ?Cardiovascular status: blood pressure returned to baseline and stable ?Postop Assessment: no apparent nausea or vomiting ?Anesthetic complications: no ? ? ?No notable events documented. ? ?Last Vitals:  ?Vitals:  ? 09/25/21 0951 09/25/21 0956  ?BP: 125/63 134/64  ?Pulse: (!) 56 (!) 56  ?Resp: 14 16  ?Temp:    ?SpO2: 98% 98%  ?  ?Last Pain:  ?Vitals:  ? 09/25/21 0956  ?TempSrc:   ?PainSc: 0-No pain  ? ? ?  ?  ?  ?  ?  ?  ? ?Wolfgang Finigan S ? ? ? ? ?

## 2021-09-25 NOTE — Anesthesia Preprocedure Evaluation (Signed)
Anesthesia Evaluation  ?Patient identified by MRN, date of birth, ID band ? ?Reviewed: ?Allergy & Precautions, NPO status , Patient's Chart, lab work & pertinent test results ? ?Airway ?Mallampati: II ? ?TM Distance: >3 FB ?Neck ROM: Full ? ? ? Dental ?no notable dental hx. ? ?  ?Pulmonary ?Current Smoker,  ?  ?Pulmonary exam normal ?breath sounds clear to auscultation ? ? ? ? ? ? Cardiovascular ?negative cardio ROS ?Normal cardiovascular exam ?Rhythm:Regular Rate:Normal ? ? ?  ?Neuro/Psych ?Schizophrenia Severe paranoid schizophrenic ?Refusing care, NGT being placed against patients wishesnegative neurological ROS ?   ? GI/Hepatic ?negative GI ROS, Neg liver ROS,   ?Endo/Other  ?negative endocrine ROS ? Renal/GU ?negative Renal ROS  ?negative genitourinary ?  ?Musculoskeletal ?negative musculoskeletal ROS ?(+)  ? Abdominal ?  ?Peds ?negative pediatric ROS ?(+)  Hematology ?negative hematology ROS ?(+)   ?Anesthesia Other Findings ? ? Reproductive/Obstetrics ?negative OB ROS ? ?  ? ? ? ? ? ? ? ? ? ? ? ? ? ?  ?  ? ? ? ? ? ? ? ? ?Anesthesia Physical ?Anesthesia Plan ? ?ASA: 2 ? ?Anesthesia Plan: General  ? ?Post-op Pain Management: Minimal or no pain anticipated  ? ?Induction: Intravenous ? ?PONV Risk Score and Plan: 0 ? ?Airway Management Planned: Mask ? ?Additional Equipment:  ? ?Intra-op Plan:  ? ?Post-operative Plan: Extubation in OR ? ?Informed Consent: I have reviewed the patients History and Physical, chart, labs and discussed the procedure including the risks, benefits and alternatives for the proposed anesthesia with the patient or authorized representative who has indicated his/her understanding and acceptance.  ? ? ? ?Dental advisory given ? ?Plan Discussed with: CRNA and Surgeon ? ?Anesthesia Plan Comments:   ? ? ? ? ? ? ?Anesthesia Quick Evaluation ? ?

## 2021-09-25 NOTE — Transfer of Care (Signed)
Immediate Anesthesia Transfer of Care Note ? ?Patient: [name] ? ?Procedure(s) Performed: ESOPHAGOGASTRODUODENOSCOPY (EGD) WITH PROPOFOL ?BIOPSY ? ?Patient Location: PACU and Endoscopy Unit ? ?Anesthesia Type:MAC ? ?Level of Consciousness: drowsy and responds to stimulation ? ?Airway & Oxygen Therapy: Patient Spontanous Breathing and Patient connected to face mask oxygen ? ?Post-op Assessment: Report given to RN and Post -op Vital signs reviewed and stable ? ?Post vital signs: Reviewed and stable ? ?Last Vitals:  ?Vitals Value Taken Time  ?BP 126/64 09/25/21 0938  ?Temp    ?Pulse 60 09/25/21 0939  ?Resp 16 09/25/21 0939  ?SpO2 100 % 09/25/21 0939  ?Vitals shown include unvalidated device data. ? ?Last Pain:  ?Vitals:  ? 09/25/21 0852  ?TempSrc: Temporal  ?PainSc: 0-No pain  ?   ? ?  ? ?Complications: No notable events documented. ?

## 2021-09-25 NOTE — Anesthesia Procedure Notes (Signed)
Procedure Name: [place] ?Date/Time: 09/25/2021 9:13 AM ?Performed by: Gean Maidens, CRNA ?Pre-anesthesia Checklist: Patient identified, Emergency Drugs available, Suction available, Patient being monitored and Timeout performed ?Patient Re-evaluated:Patient Re-evaluated prior to induction ?Oxygen Delivery Method: Simple face mask ?Placement Confirmation: positive ETCO2 ? ? ? ? ?

## 2021-09-25 NOTE — Progress Notes (Signed)
PROGRESS NOTE  [name] IDP:[id number] DOB: [birth year] DOA: 09/12/2021 PCP: [facility]  HPI/Recap of past 61 hours: 61 year old F with PMH of paranoid schizophrenia who presented to ED on 09/12/2021 under IVC from family due to not taking her meds, auditory hallucination and delusional thoughts.  She was in ED waiting on inpatient psychiatric hospitalization but became hypoglycemic due to refusal to eat or drink, and admitted for hypoglycemia.  She was started on D5 infusion but removing IV lines and IV replacement and oral intake.     Patient is evaluated by psychiatry.  She was deemed to lack capacity.  Legal guardian, sister consenting for treatment and interventions.   Patient was started on Haldol without significant improvement in her paranoia.  Continued to refuse IV placement and oral intake.  Only able to administer IM glucagon for recurrent hypoglycemia.  The decision was made for nasogastric feeding tube and soft restraints on 09/19/2021 after discussion with legal guardian. Unfortunately, ICU RN not able to advance small bore NGT.  General surgery, [facility] and IR recommended consulting radiology for fluoroscopy guided NG tube placement by radiology.  Radiology deferred NG tube placement on this patient with restraints, and recommended placement under sedation or general anesthesia.  Hospital CMO and ethics involved. Finally, [facility] consulted. Post EGD and NG tube placement on 09/25/21 by Dr. Ardis Hughs.  Highly appreciate GI and Psychiatry team's assistance.  09/25/21: Patient was seen and examined after placement of NG tube.  She has no new complaints.  Dietary consulted to initiate tube feedings.  Assessment/Plan: Principal Problem:   Recurrent hypoglycemia and severe malnutrition Active Problems:   Protein-calorie malnutrition, severe (HCC)   Schizophrenia, paranoid (HCC)   Hypokalemia, hypomagnesemia and hypophosphatemia   Metabolic acidosis   Hyponatremia    Hypophosphatemia   Hypomagnesemia   Hypokalemia  Recurrent hypoglycemia and severe malnutrition status post EGD and NG tube placement on 09/25/2021 by GI. Appreciate GIs, Dr. Ardis Hughs, assistance.   EGD revealed minimal erythema in distal stomach, biopsied to check for H. pylori. Tube feeding initiated on 09/25/2021 Continue soft restraint for patient's own safety Aspiration precautions with head of bed elevated greater than 30 degrees when receiving tube feedings. Continue Remeron and thiamine supplement  Schizophrenia, paranoid ([place]) Patient brought to the hospital under IVC by family members.  IVC renewed on 3/3.  Deemed to lack capacity and met criteria for medication over objection and inpatient psychiatric hospitalization by psychiatry. -Had long-acting Haldol injection on 3/3.  She is also on IM/p.o. Haldol -Psych added Klonopin. -Cogentin and Ativan as needed -Continue soft restraints. -Continue to follow further recommendation by psych.    Metabolic acidosis Resolved.  DC'd IV fluids on 09/25/2021.   Resolved post repletion: Hypokalemia, hypomagnesemia and hypophosphatemia Serum potassium 3.8, magnesium 2.1, phosphorus 4.2.   Resolved post IV fluid hydration: Hypovolemic hyponatremia Completed IV fluid hydration on 09/25/2021 after placement of NG tube.     DVT prophylaxis:  enoxaparin (LOVENOX) injection 30 mg Start: 09/18/21 1000   Code Status: Full code  Family Communication: Updated patient's sister, legal guardian over the phone.   Level of care: Med-Surg  Status is: Inpatient Remains inpatient appropriate because: Recurrent hypoglycemia and schizophrenia         Final disposition: Inpatient psychiatric hospital           Consultants:  Psychiatry General surgery GI IR Radiology       Status is: Inpatient Patient requires at least 2 midnights for further evaluation and treatment  of present condition.    Objective: Vitals:   09/25/21 0942  09/25/21 0951 09/25/21 0956 09/25/21 1407  BP: 135/65 125/63 134/64 (!) 96/59  Pulse: (!) 58 (!) 56 (!) 56 (!) 59  Resp: _0 Temp:    98.9 F (37.2 C)  TempSrc:    Oral  SpO2: 100% 98% 98% 100%  Weight:      Height:        Intake/Output Summary (Last 24 hours) at 09/25/2021 1507 Last data filed at 09/25/2021 1116 Gross per 24 hour  Intake 4085.81 ml  Output 0 ml  Net 4085.81 ml   Filed Weights   09/15/21 2325 09/20/21 0500 09/25/21 0852  Weight: 42.4 kg 42 kg 42 kg    Exam:  General: 61 y.o. year-old female frail-appearing in no acute distress.  She is alert.  NG tube in place. Cardiovascular: Regular rate and rhythm no rubs or gallops.   Respiratory: Clear to auscultation no wheezes or rales. Abdomen: Soft not a normal bowel sounds present. Musculoskeletal: No lower extremity edema bilaterally. Skin: No ulcerative lesions noted. Psychiatry: Flat affect.   Data Reviewed: CBC: Recent Labs  Lab 09/19/21 1451 09/22/21 1756 09/25/21 0515  WBC 12.9* 4.5 4.6  HGB 13.6 13.2 12.4  HCT 41.8 39.7 37.0  MCV 88.6 86.3 86.2  PLT 198 172 553   Basic Metabolic Panel: Recent Labs  Lab 09/19/21 1451 09/19/21 1817 09/20/21 0539 09/20/21 2149 09/22/21 1756 09/23/21 0625 09/23/21 1704 09/24/21 0610 09/24/21 1638 09/25/21 0515  NA 140  --  131*  --  135  --   --   --   --  136  K 3.8  --  3.1*  --  3.5  --   --   --   --  3.8  CL 106  --  102  --  106  --   --   --   --  105  CO2 14*  --  22  --  24  --   --   --   --  27  GLUCOSE 169*  --  125*  --  113*  --   --   --   --  101*  BUN 20  --  16  --  <5*  --   --   --   --  <5*  CREATININE 0.98  --  0.80  --  0.57  --   --  0.68  --  0.62  CALCIUM 9.1  --  8.8*  --  8.8*  --   --   --   --  8.4*  MG  --    < > 2.2   < > 1.9 1.9 1.9 2.0 2.1 2.1  PHOS  --    < > 2.8   2.8   < > 2.9 3.9 3.2 4.4 3.7 4.2   < > = values in this interval not displayed.   GFR: Estimated Creatinine Clearance: 49.6 mL/min (by C-G  formula based on SCr of 0.62 mg/dL). Liver Function Tests: Recent Labs  Lab 09/19/21 1451 09/20/21 0539 09/22/21 1756 09/25/21 0515  AST 35  --  18 18  ALT 14  --  11 13  ALKPHOS 52  --  50 45  BILITOT 0.7  --  0.2* 0.3  PROT 7.6  --  6.8 5.8*  ALBUMIN 4.3 3.9 3.7 3.2*   No results for input(s): LIPASE, AMYLASE in the last 168 hours. No results  for input(s): AMMONIA in the last 168 hours. Coagulation Profile: No results for input(s): INR, PROTIME in the last 168 hours. Cardiac Enzymes: No results for input(s): CKTOTAL, CKMB, CKMBINDEX, TROPONINI in the last 168 hours. BNP (last 3 results) No results for input(s): PROBNP in the last 8760 hours. HbA1C: No results for input(s): HGBA1C in the last 72 hours. CBG: Recent Labs  Lab 09/24/21 1157 09/24/21 1558 09/24/21 2006 09/25/21 0733 09/25/21 1147  GLUCAP 87 94 92 95 108*   Lipid Profile: No results for input(s): CHOL, HDL, LDLCALC, TRIG, CHOLHDL, LDLDIRECT in the last 72 hours. Thyroid Function Tests: No results for input(s): TSH, T4TOTAL, FREET4, T3FREE, THYROIDAB in the last 72 hours. Anemia Panel: No results for input(s): VITAMINB12, FOLATE, FERRITIN, TIBC, IRON, RETICCTPCT in the last 72 hours. Urine analysis:    Component Value Date/Time   COLORURINE STRAW (A) 03/25/2020 1608   APPEARANCEUR CLEAR 03/25/2020 1608   LABSPEC 1.004 (L) 03/25/2020 1608   PHURINE 6.0 03/25/2020 1608   GLUCOSEU NEGATIVE 03/25/2020 1608   HGBUR SMALL (A) 03/25/2020 1608   BILIRUBINUR NEGATIVE 03/25/2020 1608   KETONESUR 5 (A) 03/25/2020 1608   PROTEINUR NEGATIVE 03/25/2020 1608   UROBILINOGEN 0.2 04/10/2015 1111   NITRITE NEGATIVE 03/25/2020 1608   LEUKOCYTESUR NEGATIVE 03/25/2020 1608   Sepsis Labs: _0 (procalcitonin:4,lacticidven:4)  )No results found for this or any previous visit (from the past 240 hour(s)).    Studies: No results found.  Scheduled Meds:  clonazePAM  0.5 mg Per Tube BID   enoxaparin (LOVENOX)  injection  30 mg Subcutaneous Daily   feeding supplement (OSMOLITE 1.2 CAL)  1,000 mL Per Tube Q24H   free water  120 mL Per Tube Q6H   glucagon (human recombinant)  0.5 mg Intramuscular Once   mirtazapine  7.5 mg Per Tube QHS   [START ON 09/26/2021] multivitamin with minerals  1 tablet Per Tube Daily   thiamine injection  100 mg Intravenous Daily    Continuous Infusions:  dextrose 5 % and 0.9 % NaCl with KCl 20 mEq/L 100 mL/hr at 09/25/21 0748     LOS: 9 days     Kayleen Memos, MD [facility] Pager [phone]  If 7PM-7AM, please contact night-coverage [email or web address] Password [email or web address] 09/25/2021, 3:07 PM

## 2021-09-25 NOTE — Op Note (Signed)
[facility] ?Patient Name: [name] ?Procedure Date: 09/25/2021 ?MRN: [id number] ?Attending MD: Rachael Fee , MD ?Date of Birth: Sep 27, 1960 ?CSN: [id number] ?Age: 61 ?Admit Type: Inpatient ?Procedure:                Upper GI endoscopy ?Indications:              Unable to pass NG tube by RN (?obstruction), also  ?                          to place NG tube for nutrition assist ?Providers:                Rachael Fee, MD, Adolph Pollack, RN, Rayetta Pigg  ?                          [place]oule, Technician ?Referring MD:              ?Medicines:                Monitored Anesthesia Care ?Complications:            No immediate complications. Estimated blood loss:  ?                          None. ?Estimated Blood Loss:     Estimated blood loss: none. ?Procedure:                Pre-Anesthesia Assessment: ?                          - Prior to the procedure, a History and Physical  ?                          was performed, and patient medications and  ?                          allergies were reviewed. The patient's tolerance of  ?                          previous anesthesia was also reviewed. The risks  ?                          and benefits of the procedure and the sedation  ?                          options and risks were discussed with the patient.  ?                          All questions were answered, and informed consent  ?                          was obtained. Prior Anticoagulants: The patient has  ?                          taken no previous anticoagulant or antiplatelet  ?  agents. ASA Grade Assessment: II - A patient with  ?                          mild systemic disease. After reviewing the risks  ?                          and benefits, the patient was deemed in  ?                          satisfactory condition to undergo the procedure. ?                          After obtaining informed consent, the endoscope was  ?                          passed under direct  vision. Throughout the  ?                          procedure, the patient's blood pressure, pulse, and  ?                          oxygen saturations were monitored continuously. The  ?                          [id number]-[id number] ([id number]) Olympus endoscope was introduced  ?                          through the mouth, and advanced to the second part  ?                          of duodenum. The upper GI endoscopy was  ?                          accomplished without difficulty. The patient  ?                          tolerated the procedure well. ?Scope In: ?Scope Out: ?Findings: ?     Minimal inflammation characterized by erythema was found in the gastric  ?     antrum. Biopsies were taken with a cold forceps for histology. ?     The exam was otherwise without abnormality. ?Impression:               - Minimal erythema in distal stomach, biopsied to  ?                          check for H. pylori. ?                          - The examination was otherwise normal. ?                          - Per DPOA and hopsitalist request I placed a  ?  19gauge NG tube through her right nare following  ?                          the endoscopic procedure. This went smoothly.  ?                          Position confirmed in her stomach by insuflating  ?                          air into the tube, listening with stethoscope over  ?                          her stomach. ?Moderate Sedation: ?     Not Applicable - Patient had care per Anesthesia. ?Recommendation:           - OK to use the NG tube as needed. ?Procedure Code(s):        --- Professional --- ?                          [phone], Esophagogastroduodenoscopy, flexible,  ?                          transoral; with biopsy, single or multiple ?Diagnosis Code(s):        --- Professional --- ?                          K29.70, Gastritis, unspecified, without bleeding ?                          R13.10, Dysphagia, unspecified ?CPT copyright 2019 [employer] Association.  All rights reserved. ?The codes documented in this report are preliminary and upon coder review may  ?be revised to meet current compliance requirements. ?Rachael Fee, MD ?09/25/2021 9:34:15 AM ?This report has been signed electronically. ?Number of Addenda: 0 ?

## 2021-09-25 NOTE — Interval H&P Note (Signed)
History and Physical Interval Note: ? ?09/25/2021 ?9:10 AM ? ?[name]  has presented today for surgery, with the diagnosis of anorexia, schizophrenia, failed NGT placement.  The various methods of treatment have been discussed with the patient and family. After consideration of risks, benefits and other options for treatment, the patient has consented to  Procedure(s) with comments: ?ESOPHAGOGASTRODUODENOSCOPY (EGD) WITH PROPOFOL ([name]) - With NGT placement as a surgical intervention.  The patient's history has been reviewed, patient examined, no change in status, stable for surgery.  I have reviewed the patient's chart and labs.  Questions were answered to the patient's satisfaction.   ? ? ?Milus Banister ? ? ?

## 2021-09-26 ENCOUNTER — Other Ambulatory Visit: Payer: Self-pay

## 2021-09-26 DIAGNOSIS — E162 Hypoglycemia, unspecified: Secondary | ICD-10-CM | POA: Diagnosis not present

## 2021-09-26 LAB — PHOSPHORUS
Phosphorus: 4.5 mg/dL (ref 2.5–4.6)
Phosphorus: 4.2 mg/dL (ref 2.5–4.6)

## 2021-09-26 LAB — GLUCOSE, CAPILLARY
Glucose-Capillary: 100 mg/dL — ABNORMAL HIGH (ref 70–99)
Glucose-Capillary: 98 mg/dL (ref 70–99)
Glucose-Capillary: 142 mg/dL — ABNORMAL HIGH (ref 70–99)
Glucose-Capillary: 88 mg/dL (ref 70–99)
Glucose-Capillary: 97 mg/dL (ref 70–99)
Glucose-Capillary: 108 mg/dL — ABNORMAL HIGH (ref 70–99)

## 2021-09-26 LAB — TSH: TSH: 1.073 u[IU]/mL (ref 0.350–4.500)

## 2021-09-26 LAB — MAGNESIUM
Magnesium: 2.4 mg/dL (ref 1.7–2.4)
Magnesium: 2.5 mg/dL — ABNORMAL HIGH (ref 1.7–2.4)

## 2021-09-26 LAB — SURGICAL PATHOLOGY

## 2021-09-26 MED ORDER — OSMOLITE 1.2 CAL PO LIQD: 1000.0000 mL | ORAL | Status: DC

## 2021-09-26 MED ORDER — OSMOLITE 1.2 CAL PO LIQD: 1000.0000 mL | ORAL | Status: AC

## 2021-09-26 MED ORDER — ACETAMINOPHEN 325 MG PO TABS
650.0000 mg | ORAL_TABLET | Freq: Four times a day (QID) | ORAL | Status: DC | PRN
  Administered 2021-09-26: 650 mg
  Filled 2021-09-26: qty 2

## 2021-09-26 MED ORDER — OSMOLITE 1.2 CAL PO LIQD
1000.0000 mL | ORAL | Status: DC
  Administered 2021-09-27 – 2021-09-29 (×2): 1000 mL

## 2021-09-26 NOTE — Progress Notes (Signed)
Nutrition Note ? ?Patient continues to receive Osmolite 1.2 @ 20 ml/hr via NGT. ? ?Continue to monitor magnesium, potassium, and phosphorus BID, MD to replete as needed, as pt is at severe risk for refeeding syndrome. ? ?Currently Mg and Phos are WNL. ? ?Will plan to advance Osmolite 1.2 to 30 ml/hr tomorrow 3/11 at 1100. Will remain at 30 ml/hr for 48 hours. Pt may show late signs of refeeding syndrome. ? ?Goal rate is 70 ml/hr. ?Continue MVI via tube and free water of 120 ml QID (480 kcals). ? ?If nutrition issues arise, please consult RD.  ? ?Tilda Franco, MS, RD, LDN ?Inpatient Clinical Dietitian ?Contact information available via Amion ?  ?

## 2021-09-26 NOTE — Progress Notes (Signed)
PROGRESS NOTE  [name] LYY:[id number] DOB: 11-17-1960 DOA: 09/12/2021 PCP: [facility]  HPI/Recap of past 74 hours: 61 year old F with PMH of paranoid schizophrenia who presented to ED on 09/12/2021 under IVC from family due to not taking her meds, auditory hallucination and delusional thoughts.  She was in ED waiting on inpatient psychiatric hospitalization but became hypoglycemic due to refusal to eat or drink, and admitted for hypoglycemia.  She was started on D5 infusion but removing IV lines and IV replacement and oral intake.     Patient was evaluated by psychiatry.  She was deemed to lack capacity.  Legal guardian, sister consenting for treatment and interventions.   Patient was started on Haldol without significant improvement in her paranoia.  Continued to refuse IV placement and oral intake.  Only able to administer IM glucagon for recurrent hypoglycemia.  The decision was made for nasogastric feeding tube and soft restraints on 09/19/2021 after discussion with legal guardian. Unfortunately, ICU RN not able to advance small bore NGT.  General surgery, [facility] and IR recommended consulting radiology for fluoroscopy guided NG tube placement by radiology.  Radiology deferred NG tube placement on this patient with restraints, and recommended placement under sedation or general anesthesia.  Hospital CMO and ethics involved. Finally, [facility] consulted. Post EGD and NG tube placement on 09/25/21 by Dr. Ardis Hughs.  Highly appreciate GI and Psychiatry team's assistance.  09/26/21: Patient was seen and examined at her bedside.  There were no acute events overnight.  NG tube in place with ongoing tube feedings.  Assessment/Plan: Principal Problem:   Recurrent hypoglycemia and severe malnutrition Active Problems:   Protein-calorie malnutrition, severe (HCC)   Schizophrenia, paranoid (HCC)   Hypokalemia, hypomagnesemia and hypophosphatemia   Metabolic acidosis   Hyponatremia    Hypophosphatemia   Hypomagnesemia   Hypokalemia  Resolved hypoglycemia Severe malnutrition status post EGD and NG tube placement on 09/25/2021 by GI. Appreciate GIs, Dr. Ardis Hughs, assistance.   EGD revealed minimal erythema in distal stomach, biopsied to check for H. pylori. Tube feeding initiated on 09/25/2021, continue Continue soft restraint for patient's own safety Aspiration precautions with head of bed elevated greater than 30 degrees when receiving tube feedings. Continue Remeron and thiamine supplement  Schizophrenia, paranoid ([name]) Patient brought to the hospital under IVC by family members.  IVC renewed on 3/3.  Deemed to lack capacity and met criteria for medication over objection and inpatient psychiatric hospitalization by psychiatry. -Had long-acting Haldol injection on 3/3.  She is also on IM/p.o. Haldol -Psych added Klonopin. -Cogentin and Ativan as needed -Continue soft restraints for patient's own safety. -Continue to follow further recommendation by psych.  -Per psych possible ECT and transfer to [facility], pending consent from the patient's sister.  Resolved transient bradycardia Unclear etiology Seen on 12 EKG done 09/24/2021 Obtain TSH, last TSH from 10/01/2020 was normal 2.647 Avoid AV nodal blocker agents Repeat twelve-lead EKG on 09/26/2021. Rate is 77 at the time of this dictation   Resolved metabolic acidosis Resolved.  DC'd IV fluids on 09/25/2021.   Resolved post repletion: Hypokalemia, hypomagnesemia and hypophosphatemia Serum potassium 3.8, magnesium 2.4, phosphorus 4.5.   Resolved post IV fluid hydration: Hypovolemic hyponatremia Completed IV fluid hydration on 09/25/2021 after placement of NG tube.     DVT prophylaxis:  enoxaparin (LOVENOX) injection 30 mg Start: 09/18/21 1000   Code Status: Full code  Family Communication: Updated patient's sister, legal guardian over the phone.   Level of care: Med-Surg  Status is:  Inpatient Remains inpatient  appropriate because: Awaiting consent from guardian to proceed with ECT and transfer to [facility].   Final disposition: Inpatient psychiatric hospital           Consultants:  Psychiatry General surgery GI IR Radiology       Status is: Inpatient Patient requires at least 2 midnights for further evaluation and treatment of present condition.    Objective: Vitals:   09/25/21 1407 09/25/21 1946 09/26/21 0513 09/26/21 1347  BP: (!) 96/59 114/65 115/67 104/65  Pulse: (!) 59 60 64 72  Resp: $Remo'16 16 16 16  'gjpqj$ Temp: 98.9 F (37.2 C) 98.9 F (37.2 C) 98.3 F (36.8 C) 98.2 F (36.8 C)  TempSrc: Oral Oral Oral Oral  SpO2: 100% 100% 99% 99%  Weight:      Height:        Intake/Output Summary (Last 24 hours) at 09/26/2021 1507 Last data filed at 09/26/2021 0715 Gross per 24 hour  Intake 60 ml  Output --  Net 60 ml   Filed Weights   09/15/21 2325 09/20/21 0500 09/25/21 0852  Weight: 42.4 kg 42 kg 42 kg    Exam:  General: 61 y.o. year-old female frail-appearing in no acute distress.  She is alert and oriented x3.  NG tube in place with ongoing tube feedings  Cardiovascular: Regular rate and rhythm no rubs or gallops. Respiratory: Clear to auscultation no wheezes or rales. Abdomen: Soft nontender normal bowel sounds present. Musculoskeletal: No lower extremity edema bilaterally. Skin: No ulcerative lesions noted. Psychiatry: Flat affect.   Data Reviewed: CBC: Recent Labs  Lab 09/22/21 1756 09/25/21 0515  WBC 4.5 4.6  HGB 13.2 12.4  HCT 39.7 37.0  MCV 86.3 86.2  PLT 172 235   Basic Metabolic Panel: Recent Labs  Lab 09/20/21 0539 09/20/21 2149 09/22/21 1756 09/23/21 0625 09/24/21 0610 09/24/21 1638 09/25/21 0515 09/25/21 1656 09/26/21 0510  NA 131*  --  135  --   --   --  136  --   --   K 3.1*  --  3.5  --   --   --  3.8  --   --   CL 102  --  106  --   --   --  105  --   --   CO2 22  --  24  --   --   --  27  --   --   GLUCOSE 125*  --  113*  --   --   --   101*  --   --   BUN 16  --  <5*  --   --   --  <5*  --   --   CREATININE 0.80  --  0.57  --  0.68  --  0.62  --   --   CALCIUM 8.8*  --  8.8*  --   --   --  8.4*  --   --   MG 2.2   < > 1.9   < > 2.0 2.1 2.1 2.3 2.4  PHOS 2.8   2.8   < > 2.9   < > 4.4 3.7 4.2 4.0 4.5   < > = values in this interval not displayed.   GFR: Estimated Creatinine Clearance: 49.6 mL/min (by C-G formula based on SCr of 0.62 mg/dL). Liver Function Tests: Recent Labs  Lab 09/20/21 0539 09/22/21 1756 09/25/21 0515  AST  --  18 18  ALT  --  11 13  ALKPHOS  --  50 45  BILITOT  --  0.2* 0.3  PROT  --  6.8 5.8*  ALBUMIN 3.9 3.7 3.2*   No results for input(s): LIPASE, AMYLASE in the last 168 hours. No results for input(s): AMMONIA in the last 168 hours. Coagulation Profile: No results for input(s): INR, PROTIME in the last 168 hours. Cardiac Enzymes: No results for input(s): CKTOTAL, CKMB, CKMBINDEX, TROPONINI in the last 168 hours. BNP (last 3 results) No results for input(s): PROBNP in the last 8760 hours. HbA1C: No results for input(s): HGBA1C in the last 72 hours. CBG: Recent Labs  Lab 09/25/21 1954 09/26/21 0210 09/26/21 0407 09/26/21 0727 09/26/21 1143  GLUCAP 101* 100* 98 142* 88   Lipid Profile: No results for input(s): CHOL, HDL, LDLCALC, TRIG, CHOLHDL, LDLDIRECT in the last 72 hours. Thyroid Function Tests: No results for input(s): TSH, T4TOTAL, FREET4, T3FREE, THYROIDAB in the last 72 hours. Anemia Panel: No results for input(s): VITAMINB12, FOLATE, FERRITIN, TIBC, IRON, RETICCTPCT in the last 72 hours. Urine analysis:    Component Value Date/Time   COLORURINE STRAW (A) 03/25/2020 1608   APPEARANCEUR CLEAR 03/25/2020 1608   LABSPEC 1.004 (L) 03/25/2020 1608   PHURINE 6.0 03/25/2020 1608   GLUCOSEU NEGATIVE 03/25/2020 1608   HGBUR SMALL (A) 03/25/2020 1608   BILIRUBINUR NEGATIVE 03/25/2020 1608   KETONESUR 5 (A) 03/25/2020 1608   PROTEINUR NEGATIVE 03/25/2020 1608   UROBILINOGEN  0.2 04/10/2015 1111   NITRITE NEGATIVE 03/25/2020 1608   LEUKOCYTESUR NEGATIVE 03/25/2020 1608   Sepsis Labs: $[email or web address]'hysxtOCetSh$ (procalcitonin:4,lacticidven:4)  )No results found for this or any previous visit (from the past 240 hour(s)).    Studies: No results found.  Scheduled Meds:  clonazePAM  0.5 mg Per Tube BID   enoxaparin (LOVENOX) injection  30 mg Subcutaneous Daily   feeding supplement (OSMOLITE 1.2 CAL)  1,000 mL Per Tube Q24H   [START ON 09/27/2021] feeding supplement (OSMOLITE 1.2 CAL)  1,000 mL Per Tube Q24H   feeding supplement (OSMOLITE 1.2 CAL)  1,000 mL Per Tube Q24H   free water  120 mL Per Tube Q6H   glucagon (human recombinant)  0.5 mg Intramuscular Once   mirtazapine  7.5 mg Per Tube QHS   multivitamin with minerals  1 tablet Per Tube Daily   thiamine injection  100 mg Intravenous Daily    Continuous Infusions:     LOS: 10 days     Kayleen Memos, MD [facility] Pager [phone]  If 7PM-7AM, please contact night-coverage [email or web address] Password [facility] 09/26/2021, 3:07 PM

## 2021-09-26 NOTE — Consult Note (Signed)
?  [name] is a 61 year old female who presented to [facility] emergency department under involuntary commitment on 09/12/2021, by her legal guardian with complaints of being a danger to self and others. ? ?She has continued to refuse solid food and she has been limiting liquids. Pt's family sister [guardian]) have attempted to bring pt her favorite food from home, but she has continued to decline to eat it. Pt has demonstrated slight, incremental improvement to her agitation, but she continues to refuse solid foods during her stay (she limits adequate hydration and calories from liquids such as juices). SHe is still reciving IV fluids and now tube feedings as she has been 12+ days without oral intake.  ?  ?Besides the encouragement & support provided from staff for patient to eat regular food & to take her medications for mood stabilization of her mental health crisis. Other than refusal to eat or take medications, [name] has presented no disruptive behavior on the unit.  ? ?Today upon evaluation, pt shares, "IM here." Pt denies other specific concerns today. She denies physical complaints. She denies SI/HI/AH/VH. She remains somewhat irritable and paranoid regarding her family. She denies having a sister.  ? ? Patient did receive a long-acting injectable on March 2, has displayed no improvement in clinical presentation or the psychiatric symptoms. ? ?-Will continue with previous recommendations from nutritionist, [facility]. ? ?-Will continue mirtazapine 7.5 mg p.o. nightly for depression, anxiety, appetite, and insomnia. ? ?-Continue Klonopin 1 mg p.o. twice daily. ?-Did initiate conversation with [facility], Dr. Toni Amend for ECT evaluation.  Pending consent from legal guardian, patient will likely be transferred to [facility] for ECT therapy. ? ?-Psychiatry will continue to follow at this time.  Patient continues to meet inpatient psychiatric criteria, once medically stable. ? ? ? ?

## 2021-09-26 NOTE — TOC Progression Note (Signed)
Transition of Care (TOC) - Progression Note  ? ? ?Patient Details  ?Name: [name] ?MRN: [id number] ?Date of Birth: [birth year] ? ?Transition of Care (TOC) CM/SW Contact  ?Elverna Caffee, Meriam Sprague, RN ?Phone Number: ?09/26/2021, 2:55 PM ? ?Clinical Narrative:    ? ? ?IVC renewed, faxed to Magistrate. Custody order received and law enforcement called to serve. ?  ?  ?

## 2021-09-27 DIAGNOSIS — E162 Hypoglycemia, unspecified: Secondary | ICD-10-CM | POA: Diagnosis not present

## 2021-09-27 LAB — GLUCOSE, CAPILLARY
Glucose-Capillary: 100 mg/dL — ABNORMAL HIGH (ref 70–99)
Glucose-Capillary: 105 mg/dL — ABNORMAL HIGH (ref 70–99)
Glucose-Capillary: 105 mg/dL — ABNORMAL HIGH (ref 70–99)
Glucose-Capillary: 108 mg/dL — ABNORMAL HIGH (ref 70–99)
Glucose-Capillary: 113 mg/dL — ABNORMAL HIGH (ref 70–99)
Glucose-Capillary: 133 mg/dL — ABNORMAL HIGH (ref 70–99)

## 2021-09-27 LAB — MAGNESIUM: Magnesium: 2.7 mg/dL — ABNORMAL HIGH (ref 1.7–2.4)

## 2021-09-27 LAB — PHOSPHORUS: Phosphorus: 4.2 mg/dL (ref 2.5–4.6)

## 2021-09-27 NOTE — Progress Notes (Signed)
PROGRESS NOTE  [name] YKD:[id number] DOB: 08-13-1960 DOA: 09/12/2021 PCP: [facility]  HPI/Recap of past 19 hours: 61 year old F with PMH of paranoid schizophrenia who presented to ED on 09/12/2021 under IVC from family due to not taking her meds, auditory hallucination and delusional thoughts.  She was in ED waiting on inpatient psychiatric hospitalization but became hypoglycemic due to refusal to eat or drink, and admitted for hypoglycemia.  She was started on D5 infusion but removing IV lines and IV replacement and oral intake.     Patient was evaluated by psychiatry.  She was deemed to lack capacity.  Legal guardian, sister consenting for treatment and interventions.   Patient was started on Haldol without significant improvement in her paranoia.  Continued to refuse IV placement and oral intake.  Only able to administer IM glucagon for recurrent hypoglycemia.  The decision was made for nasogastric feeding tube and soft restraints on 09/19/2021 after discussion with legal guardian. Unfortunately, ICU RN not able to advance small bore NGT.  General surgery, [facility] and IR recommended consulting radiology for fluoroscopy guided NG tube placement by radiology.  Radiology deferred NG tube placement on this patient with restraints, and recommended placement under sedation or general anesthesia.  Hospital CMO and ethics involved. Finally, [facility] consulted. Post EGD and NG tube placement on 09/25/21 by Dr. Ardis Hughs.  Highly appreciate GI and Psychiatry team's assistance.  09/27/21:  Patient was seen at her bedside.  Flat affect.  No significant changes.  She is receptive to oral care.  Assessment/Plan: Principal Problem:   Recurrent hypoglycemia and severe malnutrition Active Problems:   Protein-calorie malnutrition, severe (HCC)   Schizophrenia, paranoid (HCC)   Hypokalemia, hypomagnesemia and hypophosphatemia   Metabolic acidosis   Hyponatremia   Hypophosphatemia   Hypomagnesemia    Hypokalemia  Resolved hypoglycemia Severe malnutrition status post EGD and NG tube placement on 09/25/2021 by GI. Appreciate GIs, Dr. Ardis Hughs, assistance.   EGD revealed minimal erythema in distal stomach, biopsied to check for H. pylori. Tube feeding initiated on 09/25/2021, continue Continue soft restraint for patient's own safety Aspiration precautions with head of bed elevated greater than 30 degrees when receiving tube feedings. Continue Remeron and thiamine supplement  Schizophrenia, paranoid ([place]) Patient brought to the hospital under IVC by family members.  IVC renewed on 3/3.  Deemed to lack capacity and met criteria for medication over objection and inpatient psychiatric hospitalization by psychiatry. -Had long-acting Haldol injection on 3/3.  She is also on IM/p.o. Haldol -Psych added Klonopin. -Cogentin and Ativan as needed -Continue soft restraints for patient's own safety. -Continue to follow further recommendation by psych.  -Per psych possible ECT and transfer to [facility], pending consent from the patient's sister.  Resolved transient bradycardia Unclear etiology Seen on 12 EKG done 09/24/2021 Obtain TSH, last TSH from 10/01/2020 was normal 2.647 Avoid AV nodal blocker agents Repeat twelve-lead EKG on 09/26/2021. Rate is 77 at the time of this dictation   Resolved metabolic acidosis Resolved.  DC'd IV fluids on 09/25/2021.   Resolved post repletion: Hypokalemia, hypomagnesemia and hypophosphatemia Serum potassium 3.8, magnesium 2.4, phosphorus 4.5.   Resolved post IV fluid hydration: Hypovolemic hyponatremia Completed IV fluid hydration on 09/25/2021 after placement of NG tube.     DVT prophylaxis:  enoxaparin (LOVENOX) injection 30 mg Start: 09/18/21 1000   Code Status: Full code  Family Communication: Updated patient's sister, legal guardian over the phone.   Level of care: Med-Surg  Status is: Inpatient Remains inpatient  appropriate because: Awaiting consent from  guardian to proceed with ECT and transfer to [facility].   Final disposition: Inpatient psychiatric hospital           Consultants:  Psychiatry General surgery GI IR Radiology       Status is: Inpatient Patient requires at least 2 midnights for further evaluation and treatment of present condition.    Objective: Vitals:   09/25/21 1946 09/26/21 0513 09/26/21 1347 09/26/21 2259  BP: 114/65 115/67 104/65 133/79  Pulse: 60 64 72 66  Resp: _0 Temp: 98.9 F (37.2 C) 98.3 F (36.8 C) 98.2 F (36.8 C) 99.5 F (37.5 C)  TempSrc: Oral Oral Oral Oral  SpO2: 100% 99% 99% 98%  Weight:      Height:        Intake/Output Summary (Last 24 hours) at 09/27/2021 1419 Last data filed at 09/27/2021 0100 Gross per 24 hour  Intake 840 ml  Output --  Net 840 ml   Filed Weights   09/15/21 2325 09/20/21 0500 09/25/21 0852  Weight: 42.4 kg 42 kg 42 kg    Exam: No significant change from prior exam.  General: 61 y.o. year-old female frail-appearing in no acute distress.  She is alert and oriented x3.  NG tube in place with ongoing tube feedings  Cardiovascular: Regular rate and rhythm no rubs or gallops. Respiratory: Clear to auscultation no wheezes or rales. Abdomen: Soft nontender normal bowel sounds present. Musculoskeletal: No lower extremity edema bilaterally. Skin: No ulcerative lesions noted. Psychiatry: Flat affect.   Data Reviewed: CBC: Recent Labs  Lab 09/22/21 1756 09/25/21 0515  WBC 4.5 4.6  HGB 13.2 12.4  HCT 39.7 37.0  MCV 86.3 86.2  PLT 172 962   Basic Metabolic Panel: Recent Labs  Lab 09/22/21 1756 09/23/21 0625 09/24/21 0610 09/24/21 1638 09/25/21 0515 09/25/21 1656 09/26/21 0510 09/26/21 1634 09/27/21 0630  NA 135  --   --   --  136  --   --   --   --   K 3.5  --   --   --  3.8  --   --   --   --   CL 106  --   --   --  105  --   --   --   --   CO2 24  --   --   --  27  --   --   --   --   GLUCOSE 113*  --   --   --  101*  --   --    --   --   BUN <5*  --   --   --  <5*  --   --   --   --   CREATININE 0.57  --  0.68  --  0.62  --   --   --   --   CALCIUM 8.8*  --   --   --  8.4*  --   --   --   --   MG 1.9   < > 2.0   < > 2.1 2.3 2.4 2.5* 2.7*  PHOS 2.9   < > 4.4   < > 4.2 4.0 4.5 4.2 4.2   < > = values in this interval not displayed.   GFR: Estimated Creatinine Clearance: 49.6 mL/min (by C-G formula based on SCr of 0.62 mg/dL). Liver Function Tests: Recent Labs  Lab 09/22/21 1756 09/25/21 0515  AST  18 18  ALT 11 13  ALKPHOS 50 45  BILITOT 0.2* 0.3  PROT 6.8 5.8*  ALBUMIN 3.7 3.2*   No results for input(s): LIPASE, AMYLASE in the last 168 hours. No results for input(s): AMMONIA in the last 168 hours. Coagulation Profile: No results for input(s): INR, PROTIME in the last 168 hours. Cardiac Enzymes: No results for input(s): CKTOTAL, CKMB, CKMBINDEX, TROPONINI in the last 168 hours. BNP (last 3 results) No results for input(s): PROBNP in the last 8760 hours. HbA1C: No results for input(s): HGBA1C in the last 72 hours. CBG: Recent Labs  Lab 09/26/21 2034 09/26/21 2358 09/27/21 0415 09/27/21 0743 09/27/21 1156  GLUCAP 108* 105* 100* 113* 105*   Lipid Profile: No results for input(s): CHOL, HDL, LDLCALC, TRIG, CHOLHDL, LDLDIRECT in the last 72 hours. Thyroid Function Tests: Recent Labs    09/26/21 0510  TSH 1.073   Anemia Panel: No results for input(s): VITAMINB12, FOLATE, FERRITIN, TIBC, IRON, RETICCTPCT in the last 72 hours. Urine analysis:    Component Value Date/Time   COLORURINE STRAW (A) 03/25/2020 1608   APPEARANCEUR CLEAR 03/25/2020 1608   LABSPEC 1.004 (L) 03/25/2020 1608   PHURINE 6.0 03/25/2020 1608   GLUCOSEU NEGATIVE 03/25/2020 1608   HGBUR SMALL (A) 03/25/2020 1608   BILIRUBINUR NEGATIVE 03/25/2020 1608   KETONESUR 5 (A) 03/25/2020 1608   PROTEINUR NEGATIVE 03/25/2020 1608   UROBILINOGEN 0.2 04/10/2015 1111   NITRITE NEGATIVE 03/25/2020 1608   LEUKOCYTESUR NEGATIVE  03/25/2020 1608   Sepsis Labs: _0 (procalcitonin:4,lacticidven:4)  )No results found for this or any previous visit (from the past 240 hour(s)).    Studies: No results found.  Scheduled Meds:  clonazePAM  0.5 mg Per Tube BID   enoxaparin (LOVENOX) injection  30 mg Subcutaneous Daily   feeding supplement (OSMOLITE 1.2 CAL)  1,000 mL Per Tube Q24H   free water  120 mL Per Tube Q6H   glucagon (human recombinant)  0.5 mg Intramuscular Once   mirtazapine  7.5 mg Per Tube QHS   multivitamin with minerals  1 tablet Per Tube Daily   thiamine injection  100 mg Intravenous Daily    Continuous Infusions:     LOS: 11 days     Kayleen Memos, MD [facility] Pager [phone]  If 7PM-7AM, please contact night-coverage [email or web address] Password [facility] 09/27/2021, 2:19 PM

## 2021-09-27 NOTE — Consult Note (Addendum)
Telepsych Consultation   Reason for Consult: Worsening psychosis,.  Referring Physician: EDP Location of Patient: [facility] ED Location of Provider: Behavioral Health TTS Department  Patient Identification: [name] MRN:  [id number] Principal Diagnosis: Hypoglycemia Diagnosis:  Principal Problem:   Recurrent hypoglycemia and severe malnutrition Active Problems:   Schizophrenia, paranoid (HCC)   Hypokalemia, hypomagnesemia and hypophosphatemia   Protein-calorie malnutrition, severe (HCC)   Metabolic acidosis   Hyponatremia   Hypophosphatemia   Hypomagnesemia   Hypokalemia  Total Time Spent in Direct Patient Care:  I personally spent 35 minutes on the unit in direct patient care. The direct patient care time included face-to-face time with the patient, reviewing the patient's chart, communicating with other professionals, and coordinating care. Greater than 50% of this time was spent in counseling or coordinating care with the patient regarding goals of hospitalization, psycho-education, and discharge planning needs.   Subjective: "I was apprehended from my home."  HPI:  [name] is a 61 y.o. female patient, was brought to the ED under IVC due to worsening psychosis in the wake of medication non-compliance. Patient has hx of Schizophrenia (chronic). Is known at the psychiatry from her previous admission for mood stabilizations treatments.   On assessment today, patient is sitting comfortably in bed.  Nursing is at bedside.  Nursing states that patient has been doing well, and they have been able to remove restraints.  She has been able to get up to the bathroom and was stable on her feet with assistance.  She continues to have a sitter at her bedside.  She is tolerating NG tube.  She has been taking medications as prescribed.  She required Tylenol PRN for headache yesterday.  Patient states, "I will eat once I go home."  Patient makes some insinuation regarding "they are  keeping me from eating."  When requesting clarification, she states "I am not going into all of that."  Patient denies any suicidal or homicidal ideation.  She denies any auditory or visual hallucinations, but does continue to appear to be responding to internal stimuli.   Past Psychiatric History: Schizophrenia.  Risk to Self: Yes, has been refusing p.o. is now receiving feedings via NG tube. Risk to Others: verbally aggressive. Prior Inpatient Therapy: [facility] x multiple times. Prior Outpatient Therapy: Yes.  Past Medical History:  Past Medical History:  Diagnosis Date   Non compliance w medication regimen    Schizophrenia (HCC)    History reviewed. No pertinent surgical history.  Family History: History reviewed. No pertinent family history.  Family Psychiatric  History: Refuses to answer.   Social History: Single. Social History   Substance and Sexual Activity  Alcohol Use Not Currently   Comment: Refuses to disclose how much     Social History   Substance and Sexual Activity  Drug Use No   Comment: Refuses to answer    Social History   Socioeconomic History   Marital status: Single    Spouse name: Not on file   Number of children: Not on file   Years of education: Not on file   Highest education level: Not on file  Occupational History   Not on file  Tobacco Use   Smoking status: Some Days    Packs/day: 0.50    Types: Cigarettes   Smokeless tobacco: Never  Vaping Use   Vaping Use: Every day  Substance and Sexual Activity   Alcohol use: Not Currently    Comment: Refuses to disclose how much  Drug use: No    Comment: Refuses to answer   Sexual activity: Never    Comment: refused to answer  Other Topics Concern   Not on file  Social History Narrative   Not on file   Social Determinants of Health   Financial Resource Strain: Not on file  Food Insecurity: Not on file  Transportation Needs: Not on file  Physical Activity: Not on file  Stress: Not on  file  Social Connections: Not on file   Additional Social History:  Lives alone  Allergies:   Allergies  Allergen Reactions   Aspirin Hives and Other (See Comments)    Possibly caused hives, per sister   Ziprasidone Hcl Rash and Other (See Comments)    Caused twitching   Labs:  Results for orders placed or performed during the hospital encounter of 09/12/21 (from the past 48 hour(s))  Glucose, capillary     Status: Abnormal   Collection Time: 09/25/21 11:47 AM  Result Value Ref Range   Glucose-Capillary 108 (H) 70 - 99 mg/dL    Comment: Glucose reference range applies only to samples taken after fasting for at least 8 hours.   Comment 1 Notify RN    Comment 2 Document in Chart   Glucose, capillary     Status: Abnormal   Collection Time: 09/25/21  4:21 PM  Result Value Ref Range   Glucose-Capillary 116 (H) 70 - 99 mg/dL    Comment: Glucose reference range applies only to samples taken after fasting for at least 8 hours.   Comment 1 Notify RN    Comment 2 Document in Chart   Magnesium     Status: None   Collection Time: 09/25/21  4:56 PM  Result Value Ref Range   Magnesium 2.3 1.7 - 2.4 mg/dL    Comment: Performed at [facility], [address] [place], [place] [address]  Phosphorus     Status: None   Collection Time: 09/25/21  4:56 PM  Result Value Ref Range   Phosphorus 4.0 2.5 - 4.6 mg/dL    Comment: Performed at [facility], [address], [place], [place] [address]  Glucose, capillary     Status: Abnormal   Collection Time: 09/25/21  7:54 PM  Result Value Ref Range   Glucose-Capillary 101 (H) 70 - 99 mg/dL    Comment: Glucose reference range applies only to samples taken after fasting for at least 8 hours.  Glucose, capillary     Status: Abnormal   Collection Time: 09/26/21  2:10 AM  Result Value Ref Range   Glucose-Capillary 100 (H) 70 - 99 mg/dL    Comment: Glucose reference range applies only to samples taken after  fasting for at least 8 hours.  Glucose, capillary     Status: None   Collection Time: 09/26/21  4:07 AM  Result Value Ref Range   Glucose-Capillary 98 70 - 99 mg/dL    Comment: Glucose reference range applies only to samples taken after fasting for at least 8 hours.  Magnesium     Status: None   Collection Time: 09/26/21  5:10 AM  Result Value Ref Range   Magnesium 2.4 1.7 - 2.4 mg/dL    Comment: Performed at [facility], [address] [place], [place] [address]  Phosphorus     Status: None   Collection Time: 09/26/21  5:10 AM  Result Value Ref Range   Phosphorus 4.5 2.5 - 4.6 mg/dL  Comment: Performed at [facility], [address], [place], [place] [address]  TSH     Status: None   Collection Time: 09/26/21  5:10 AM  Result Value Ref Range   TSH 1.073 0.350 - 4.500 uIU/mL    Comment: Performed by a 3rd Generation assay with a functional sensitivity of <=0.01 uIU/mL. Performed at [facility], [address], [place], [place] [address]   Glucose, capillary     Status: Abnormal   Collection Time: 09/26/21  7:27 AM  Result Value Ref Range   Glucose-Capillary 142 (H) 70 - 99 mg/dL    Comment: Glucose reference range applies only to samples taken after fasting for at least 8 hours.  Glucose, capillary     Status: None   Collection Time: 09/26/21 11:43 AM  Result Value Ref Range   Glucose-Capillary 88 70 - 99 mg/dL    Comment: Glucose reference range applies only to samples taken after fasting for at least 8 hours.  Glucose, capillary     Status: None   Collection Time: 09/26/21  4:12 PM  Result Value Ref Range   Glucose-Capillary 97 70 - 99 mg/dL    Comment: Glucose reference range applies only to samples taken after fasting for at least 8 hours.  Magnesium     Status: Abnormal   Collection Time: 09/26/21  4:34 PM  Result Value Ref Range   Magnesium 2.5 (H) 1.7 - 2.4 mg/dL    Comment: Performed at [facility], [address], [place], [place] [address]  Phosphorus     Status: None   Collection Time: 09/26/21  4:34 PM  Result Value Ref Range   Phosphorus 4.2 2.5 - 4.6 mg/dL    Comment: Performed at [facility], [address] [place], [place] [address]  Glucose, capillary     Status: Abnormal   Collection Time: 09/26/21  8:34 PM  Result Value Ref Range   Glucose-Capillary 108 (H) 70 - 99 mg/dL    Comment: Glucose reference range applies only to samples taken after fasting for at least 8 hours.  Glucose, capillary     Status: Abnormal   Collection Time: 09/26/21 11:58 PM  Result Value Ref Range   Glucose-Capillary 105 (H) 70 - 99 mg/dL    Comment: Glucose reference range applies only to samples taken after fasting for at least 8 hours.  Glucose, capillary     Status: Abnormal   Collection Time: 09/27/21  4:15 AM  Result Value Ref Range   Glucose-Capillary 100 (H) 70 - 99 mg/dL    Comment: Glucose reference range applies only to samples taken after fasting for at least 8 hours.  Magnesium     Status: Abnormal   Collection Time: 09/27/21  6:30 AM  Result Value Ref Range   Magnesium 2.7 (H) 1.7 - 2.4 mg/dL    Comment: Performed at [facility], [address], [place], [place] [address]  Phosphorus     Status: None   Collection Time: 09/27/21  6:30 AM  Result Value Ref Range   Phosphorus 4.2 2.5 - 4.6 mg/dL    Comment: Performed at [facility], [address], [place], [place] [address]  Glucose, capillary     Status: Abnormal   Collection Time: 09/27/21  7:43 AM  Result Value Ref Range   Glucose-Capillary 113 (H) 70 - 99 mg/dL    Comment: Glucose reference range applies only to samples taken  after fasting for at least 8 hours.   Comment 1 Notify RN    Comment 2 Document in Chart    Medications:  Current Facility-Administered Medications  Medication Dose Route Frequency Provider Last Rate Last Admin    acetaminophen (TYLENOL) tablet 650 mg  650 mg Per Tube Q6H PRN Dow AdolphHall, Carole N, DO   650 mg at 09/26/21 1149   benztropine (COGENTIN) tablet 0.5 mg  0.5 mg Oral BID PRN Rachael FeeJacobs, Daniel P, MD       Or   benztropine mesylate (COGENTIN) injection 0.5 mg  0.5 mg Intramuscular BID PRN Rachael FeeJacobs, Daniel P, MD   0.5 mg at 09/15/21 2122   clonazePAM (KLONOPIN) tablet 0.5 mg  0.5 mg Per Tube BID Maryagnes AmosStarkes-Perry, Takia S, FNP   0.5 mg at 09/27/21 0928   dextrose 50 % solution 50 mL  1 ampule Intravenous PRN Rachael FeeJacobs, Daniel P, MD   50 mL at 09/16/21 0819   enoxaparin (LOVENOX) injection 30 mg  30 mg Subcutaneous Daily Rachael FeeJacobs, Daniel P, MD   30 mg at 09/26/21 1149   feeding supplement (OSMOLITE 1.2 CAL) liquid 1,000 mL  1,000 mL Per Tube Q24H Dow AdolphHall, Carole N, DO 20 mL/hr at 09/26/21 1027 Restarted at 09/26/21 1027   feeding supplement (OSMOLITE 1.2 CAL) liquid 1,000 mL  1,000 mL Per Tube Q24H Dow AdolphHall, Carole N, DO       feeding supplement (OSMOLITE 1.2 CAL) liquid 1,000 mL  1,000 mL Per Tube Q24H Dow AdolphHall, Carole N, DO 20 mL/hr at 09/26/21 1353 Restarted at 09/26/21 1353   free water 120 mL  120 mL Per Tube Q6H Dow AdolphHall, Carole N, DO   120 mL at 09/27/21 0505   glucagon (human recombinant) (GLUCAGEN) injection 0.5 mg  0.5 mg Intramuscular Once Rachael FeeJacobs, Daniel P, MD       LORazepam (ATIVAN) injection 2 mg  2 mg Intramuscular Q6H PRN Rachael FeeJacobs, Daniel P, MD   2 mg at 09/15/21 2122   mirtazapine (REMERON SOL-TAB) disintegrating tablet 7.5 mg  7.5 mg Per Tube QHS Maryagnes AmosStarkes-Perry, Takia S, FNP   7.5 mg at 09/26/21 2133   multivitamin with minerals tablet 1 tablet  1 tablet Per Tube Daily Dow AdolphHall, Carole N, DO   1 tablet at 09/27/21 [id number]   thiamine (B-1) injection 100 mg  100 mg Intravenous Daily Rachael FeeJacobs, Daniel P, MD   100 mg at 09/27/21 [id number]   traZODone (DESYREL) tablet 50 mg  50 mg Per Tube QHS PRN Maryagnes AmosStarkes-Perry, Takia S, FNP       Musculoskeletal: Strength & Muscle Tone: within normal limits Gait & Station: normal Patient leans:  [name]  Psychiatric Specialty Exam:  Presentation  General Appearance: Casual  Eye Contact:Fair  Speech:Clear and Coherent  Speech Volume:Normal  Handedness:Right   Mood and Affect  Mood:Irritable; Dysphoric  Affect:Congruent   Thought Process  Thought Processes:Irrevelant  Descriptions of Associations:Tangential  Orientation:-- ("I don't care")  Thought Content:Illogical  History of Schizophrenia/Schizoaffective disorder:Yes  Duration of Psychotic Symptoms:Greater than six months  Hallucinations:Hallucinations: -- (denies, but appears to be RIS, and states "they are keeping me from eating")   Ideas of Reference:None  Suicidal Thoughts:Suicidal Thoughts: No   Homicidal Thoughts:Homicidal Thoughts: No    Sensorium  Memory:Immediate Poor; Recent Poor; Remote Poor  Judgment:Impaired  Insight:Lacking  Executive Functions  Concentration:Poor  Attention Span:Fair  Recall:Poor  Fund of Knowledge:Fair  Language:Fair  Psychomotor Activity  Psychomotor Activity:Psychomotor Activity: Normal    Assets  Assets:Communication Skills; Desire for Improvement; Resilience  Sleep  Sleep:Sleep: Good    Physical Exam: Physical Exam Vitals and nursing note reviewed.  Constitutional:      Comments: Thin  HENT:     Nose: Nose normal.  Cardiovascular:     Rate and Rhythm: Normal rate.  Pulmonary:     Effort: Pulmonary effort is normal. No respiratory distress.  Musculoskeletal:        General: Normal range of motion.  Neurological:     General: No focal deficit present.     Mental Status: She is alert.   Review of Systems  Constitutional:  Negative for chills and fever.  HENT:  Negative for congestion and sore throat.   Respiratory:  Negative for cough, shortness of breath and wheezing.   Cardiovascular:  Negative for chest pain and palpitations.  Gastrointestinal:  Negative for heartburn, nausea and vomiting.  Neurological:  Negative for  dizziness, tremors and seizures.  Endo/Heme/Allergies:        Allergie: ASA, Geodon  Psychiatric/Behavioral:  Positive for hallucinations. Negative for depression, substance abuse and suicidal ideas. The patient is nervous/anxious. The patient does not have insomnia.   Blood pressure 133/79, pulse 66, temperature 99.5 F (37.5 C), temperature source Oral, resp. rate 16, height 5\' 4"  (1.626 m), weight 42 kg, SpO2 98 %. Body mass index is 15.89 kg/m.  Treatment Plan Summary: Daily contact with patient to assess and evaluate symptoms and progress in treatment and Medication management.   Patient did receive a long-acting injectable on March 2, has displayed no improvement in clinical presentation or the psychiatric symptoms.   -Will continue with previous recommendations from nutritionist, [facility] hospitalist.   -Will continue mirtazapine 7.5 mg p.o. nightly for depression, anxiety, appetite, and insomnia.   -Continue Klonopin 1 mg p.o. twice daily. -Did initiate conversation with [facility], Dr. 02-26-1999 for ECT evaluation.  Pending consent from legal guardian, patient will likely be transferred to [facility] for E[facility] therapy.  - Consider offering PO intake   -Psychiatry will continue to follow at this time.  Patient continues to meet inpatient psychiatric criteria, once medically stable.    [facility], MD 09/27/2021 10:11 AM

## 2021-09-28 DIAGNOSIS — E162 Hypoglycemia, unspecified: Secondary | ICD-10-CM | POA: Diagnosis not present

## 2021-09-28 LAB — GLUCOSE, CAPILLARY
Glucose-Capillary: 136 mg/dL — ABNORMAL HIGH (ref 70–99)
Glucose-Capillary: 120 mg/dL — ABNORMAL HIGH (ref 70–99)
Glucose-Capillary: 123 mg/dL — ABNORMAL HIGH (ref 70–99)
Glucose-Capillary: 100 mg/dL — ABNORMAL HIGH (ref 70–99)
Glucose-Capillary: 121 mg/dL — ABNORMAL HIGH (ref 70–99)
Glucose-Capillary: 111 mg/dL — ABNORMAL HIGH (ref 70–99)

## 2021-09-28 LAB — MAGNESIUM: Magnesium: 2.6 mg/dL — ABNORMAL HIGH (ref 1.7–2.4)

## 2021-09-28 LAB — PHOSPHORUS: Phosphorus: 4.3 mg/dL (ref 2.5–4.6)

## 2021-09-28 NOTE — Consult Note (Signed)
Telepsych Consultation   Reason for Consult: Worsening psychosis,.  Referring Physician: EDP Location of Patient[facility] Location of Provider: Behavioral Health TTS Department  Patient Identification: [name] MRN:  [id number] Principal Diagnosis: Hypoglycemia Diagnosis:  Principal Problem:   Recurrent hypoglycemia and severe malnutrition Active Problems:   Schizophrenia, paranoid (HCC)   Hypokalemia, hypomagnesemia and hypophosphatemia   Protein-calorie malnutrition, severe (HCC)   Metabolic acidosis   Hyponatremia   Hypophosphatemia   Hypomagnesemia   Hypokalemia  Total Time Spent in Direct Patient Care:  I personally spent 25 minutes on the unit in direct patient care. The direct patient care time included face-to-face time with the patient, reviewing the patient's chart, communicating with other professionals, and coordinating care. Greater than 50% of this time was spent in counseling or coordinating care with the patient regarding goals of hospitalization, psycho-education, and discharge planning needs.   Subjective: "I am not going to eat until I go home.  The food here is terrible."  HPI:  [name] is a 61 y.o. female patient, was brought to the ED under IVC due to worsening psychosis in the wake of medication non-compliance. Patient has hx of Schizophrenia (chronic). Is known at the psychiatry from her previous admission for mood stabilizations treatments.   On assessment today, patient is sitting comfortably in bed.  Sitter at bedside.  Sitter states that patient has been doing well, and resting most of the day. NG tube in place, patient tolerating patient is otherwise stable.  She is refusing to eat until she goes home, and acknowledges that she will not be able to go home until she eats.  She denies SI, HI, AVH.  Today, she does not make any paranoid comments.  Patient refuses to answer orientation questions.   Past Psychiatric History:  Schizophrenia.  Risk to Self: Yes, has been refusing p.o. is now receiving feedings via NG tube. Risk to Others: verbally aggressive. Prior Inpatient Therapy: [facility] x multiple times. Prior Outpatient Therapy: Yes.  Past Medical History:  Past Medical History:  Diagnosis Date   Non compliance w medication regimen    Schizophrenia (HCC)    History reviewed. No pertinent surgical history.  Family History: History reviewed. No pertinent family history.  Family Psychiatric  History: Refuses to answer.   Social History: Single. Social History   Substance and Sexual Activity  Alcohol Use Not Currently   Comment: Refuses to disclose how much     Social History   Substance and Sexual Activity  Drug Use No   Comment: Refuses to answer    Social History   Socioeconomic History   Marital status: Single    Spouse name: Not on file   Number of children: Not on file   Years of education: Not on file   Highest education level: Not on file  Occupational History   Not on file  Tobacco Use   Smoking status: Some Days    Packs/day: 0.50    Types: Cigarettes   Smokeless tobacco: Never  Vaping Use   Vaping Use: Every day  Substance and Sexual Activity   Alcohol use: Not Currently    Comment: Refuses to disclose how much   Drug use: No    Comment: Refuses to answer   Sexual activity: Never    Comment: refused to answer  Other Topics Concern   Not on file  Social History Narrative   Not on file   Social Determinants of Health   Financial Resource Strain:  Not on file  Food Insecurity: Not on file  Transportation Needs: Not on file  Physical Activity: Not on file  Stress: Not on file  Social Connections: Not on file   Additional Social History:  Lives alone  Allergies:   Allergies  Allergen Reactions   Aspirin Hives and Other (See Comments)    Possibly caused hives, per sister   Ziprasidone Hcl Rash and Other (See Comments)    Caused twitching   Labs:  Results for  orders placed or performed during the hospital encounter of 09/12/21 (from the past 48 hour(s))  Glucose, capillary     Status: None   Collection Time: 09/26/21  4:12 PM  Result Value Ref Range   Glucose-Capillary 97 70 - 99 mg/dL    Comment: Glucose reference range applies only to samples taken after fasting for at least 8 hours.  Magnesium     Status: Abnormal   Collection Time: 09/26/21  4:34 PM  Result Value Ref Range   Magnesium 2.5 (H) 1.7 - 2.4 mg/dL    Comment: Performed at [facility], [address], [place], [place] [address]  Phosphorus     Status: None   Collection Time: 09/26/21  4:34 PM  Result Value Ref Range   Phosphorus 4.2 2.5 - 4.6 mg/dL    Comment: Performed at [facility], [address] [place], [place] [address]  Glucose, capillary     Status: Abnormal   Collection Time: 09/26/21  8:34 PM  Result Value Ref Range   Glucose-Capillary 108 (H) 70 - 99 mg/dL    Comment: Glucose reference range applies only to samples taken after fasting for at least 8 hours.  Glucose, capillary     Status: Abnormal   Collection Time: 09/26/21 11:58 PM  Result Value Ref Range   Glucose-Capillary 105 (H) 70 - 99 mg/dL    Comment: Glucose reference range applies only to samples taken after fasting for at least 8 hours.  Glucose, capillary     Status: Abnormal   Collection Time: 09/27/21  4:15 AM  Result Value Ref Range   Glucose-Capillary 100 (H) 70 - 99 mg/dL    Comment: Glucose reference range applies only to samples taken after fasting for at least 8 hours.  Magnesium     Status: Abnormal   Collection Time: 09/27/21  6:30 AM  Result Value Ref Range   Magnesium 2.7 (H) 1.7 - 2.4 mg/dL    Comment: Performed at [facility], [address], [place], [place] [address]  Phosphorus     Status: None   Collection Time: 09/27/21  6:30 AM  Result Value Ref Range   Phosphorus 4.2 2.5 - 4.6 mg/dL    Comment: Performed at [facility], [address], [place], [place] [address]  Glucose, capillary     Status: Abnormal   Collection Time: 09/27/21  7:43 AM  Result Value Ref Range   Glucose-Capillary 113 (H) 70 - 99 mg/dL    Comment: Glucose reference range applies only to samples taken after fasting for at least 8 hours.   Comment 1 Notify RN    Comment 2 Document in Chart   Glucose, capillary     Status: Abnormal   Collection Time: 09/27/21 11:56 AM  Result Value Ref Range   Glucose-Capillary 105 (H) 70 - 99 mg/dL    Comment: Glucose reference range applies only to samples taken after fasting for at least 8 hours.  Comment 1 Notify RN    Comment 2 Document in Chart   Glucose, capillary     Status: Abnormal   Collection Time: 09/27/21  7:57 PM  Result Value Ref Range   Glucose-Capillary 133 (H) 70 - 99 mg/dL    Comment: Glucose reference range applies only to samples taken after fasting for at least 8 hours.  Glucose, capillary     Status: Abnormal   Collection Time: 09/27/21 11:51 PM  Result Value Ref Range   Glucose-Capillary 108 (H) 70 - 99 mg/dL    Comment: Glucose reference range applies only to samples taken after fasting for at least 8 hours.  Glucose, capillary     Status: Abnormal   Collection Time: 09/28/21  3:48 AM  Result Value Ref Range   Glucose-Capillary 136 (H) 70 - 99 mg/dL    Comment: Glucose reference range applies only to samples taken after fasting for at least 8 hours.  Magnesium     Status: Abnormal   Collection Time: 09/28/21  6:10 AM  Result Value Ref Range   Magnesium 2.6 (H) 1.7 - 2.4 mg/dL    Comment: Performed at [facility], [address] [place], [place] [address]  Phosphorus     Status: None   Collection Time: 09/28/21  6:10 AM  Result Value Ref Range   Phosphorus 4.3 2.5 - 4.6 mg/dL    Comment: Performed at [facility], [address] [place], [place] [address]  Glucose, capillary     Status: Abnormal    Collection Time: 09/28/21  8:05 AM  Result Value Ref Range   Glucose-Capillary 120 (H) 70 - 99 mg/dL    Comment: Glucose reference range applies only to samples taken after fasting for at least 8 hours.  Glucose, capillary     Status: Abnormal   Collection Time: 09/28/21 12:05 PM  Result Value Ref Range   Glucose-Capillary 123 (H) 70 - 99 mg/dL    Comment: Glucose reference range applies only to samples taken after fasting for at least 8 hours.   Medications:  Current Facility-Administered Medications  Medication Dose Route Frequency Provider Last Rate Last Admin   acetaminophen (TYLENOL) tablet 650 mg  650 mg Per Tube Q6H PRN Dow Adolph N, DO   650 mg at 09/26/21 1149   benztropine (COGENTIN) tablet 0.5 mg  0.5 mg Oral BID PRN Rachael Fee, MD       Or   benztropine mesylate (COGENTIN) injection 0.5 mg  0.5 mg Intramuscular BID PRN Rachael Fee, MD   0.5 mg at 09/15/21 2122   clonazePAM (KLONOPIN) tablet 0.5 mg  0.5 mg Per Tube BID Maryagnes Amos, FNP   0.5 mg at 09/28/21 0939   dextrose 50 % solution 50 mL  1 ampule Intravenous PRN Rachael Fee, MD   50 mL at 09/16/21 0819   enoxaparin (LOVENOX) injection 30 mg  30 mg Subcutaneous Daily Rachael Fee, MD   30 mg at 09/26/21 1149   feeding supplement (OSMOLITE 1.2 CAL) liquid 1,000 mL  1,000 mL Per Tube Q24H Dow Adolph N, DO 30 mL/hr at 09/28/21 1031 Restarted at 09/28/21 1031   free water 120 mL  120 mL Per Tube Q6H Hall, Carole N, DO   120 mL at 09/28/21 1247   glucagon (human recombinant) (GLUCAGEN) injection 0.5 mg  0.5 mg Intramuscular Once Rachael Fee, MD       LORazepam (ATIVAN) injection 2 mg  2  mg Intramuscular Q6H PRN Rachael Fee, MD   2 mg at 09/15/21 2122   mirtazapine (REMERON SOL-TAB) disintegrating tablet 7.5 mg  7.5 mg Per Tube QHS Maryagnes Amos, FNP   7.5 mg at 09/27/21 2109   multivitamin with minerals tablet 1 tablet  1 tablet Per Tube Daily Dow Adolph N, DO   1 tablet at  09/28/21 4098   thiamine (B-1) injection 100 mg  100 mg Intravenous Daily Rachael Fee, MD   100 mg at 09/28/21 1191   traZODone (DESYREL) tablet 50 mg  50 mg Per Tube QHS PRN Maryagnes Amos, FNP       Musculoskeletal: Strength & Muscle Tone: within normal limits Gait & Station: normal Patient leans: [name]  Psychiatric Specialty Exam:  Presentation  General Appearance: Casual  Eye Contact:Fair  Speech:Clear and Coherent  Speech Volume:Decreased  Handedness:Right   Mood and Affect  Mood:Irritable; Dysphoric  Affect:Congruent   Thought Process  Thought Processes:Other (comment) (concrete)  Descriptions of Associations:Intact  Orientation:Other (comment) (refuses to answer)  Thought Content:Paranoid Ideation  History of Schizophrenia/Schizoaffective disorder:Yes  Duration of Psychotic Symptoms:Greater than six months  Hallucinations:Hallucinations: None   Ideas of Reference:None  Suicidal Thoughts:Suicidal Thoughts: No   Homicidal Thoughts:Homicidal Thoughts: No    Sensorium  Memory:Immediate Poor; Recent Poor; Remote Poor  Judgment:Impaired  Insight:Lacking  Executive Functions  Concentration:Poor  Attention Span:Fair  Recall:Poor  Fund of Knowledge:Fair  Language:Fair  Psychomotor Activity  Psychomotor Activity:Psychomotor Activity: Normal    Assets  Assets:Resilience  Sleep  Sleep:Sleep: Good    Physical Exam: Physical Exam Vitals and nursing note reviewed.  Constitutional:      Comments: Thin  HENT:     Nose: Nose normal.  Cardiovascular:     Rate and Rhythm: Normal rate.  Pulmonary:     Effort: Pulmonary effort is normal. No respiratory distress.  Musculoskeletal:        General: Normal range of motion.  Neurological:     General: No focal deficit present.     Mental Status: She is alert.   Review of Systems  Constitutional:  Negative for chills and fever.  HENT:  Negative for congestion and sore  throat.   Respiratory:  Negative for cough, shortness of breath and wheezing.   Cardiovascular:  Negative for chest pain and palpitations.  Gastrointestinal:  Negative for heartburn, nausea and vomiting.  Neurological:  Negative for dizziness, tremors and seizures.  Endo/Heme/Allergies:        Allergie: ASA, Geodon  Psychiatric/Behavioral:  Negative for depression, hallucinations, substance abuse and suicidal ideas. The patient is not nervous/anxious and does not have insomnia.   Blood pressure 98/67, pulse 60, temperature 99 F (37.2 C), temperature source Oral, resp. rate 16, height  (1.626 m), weight 43.3 kg, SpO2 98 %. Body mass index is 16.39 kg/m.  Treatment Plan Summary: Daily contact with patient to assess and evaluate symptoms and progress in treatment and Medication management.   Patient did receive a long-acting injectable on March 2, has displayed no improvement in clinical presentation or the psychiatric symptoms.   -Will continue with previous recommendations from nutritionist, [facility].   -Will continue mirtazapine 7.5 mg p.o. nightly for depression, anxiety, appetite, and insomnia.   -Continue Klonopin 1 mg p.o. twice daily. -Did initiate conversation with [facility], Dr. Toni Amend for ECT evaluation.  Pending consent from legal guardian, patient will likely be transferred to [name] for ECT therapy.  - Consider offering PO intake   -Psychiatry will  continue to follow at this time.  Patient continues to meet inpatient psychiatric criteria, once medically stable.    Mariel Craft, MD 09/28/2021 5:08 PM

## 2021-09-28 NOTE — Progress Notes (Signed)
PROGRESS NOTE  Zakeya Junker VEH:[id number] DOB: May 13, 1961 DOA: 09/12/2021 PCP: [facility]  HPI/Recap of past 28 hours: 61 year old F with PMH of paranoid schizophrenia who presented to ED on 09/12/2021 under IVC from family due to not taking her meds, auditory hallucination and delusional thoughts.  She was in ED waiting on inpatient psychiatric hospitalization but became hypoglycemic due to refusal to eat or drink, and admitted for hypoglycemia.  She was started on D5 infusion but removing IV lines and IV replacement and oral intake.     Patient was evaluated by psychiatry.  She was deemed to lack capacity.  Legal guardian, sister consenting for treatment and interventions.   Patient was started on Haldol without significant improvement in her paranoia.  Continued to refuse IV placement and oral intake.  Only able to administer IM glucagon for recurrent hypoglycemia.  The decision was made for nasogastric feeding tube and soft restraints on 09/19/2021 after discussion with legal guardian. Unfortunately, ICU RN not able to advance small bore NGT.  General surgery, [facility] and IR recommended consulting radiology for fluoroscopy guided NG tube placement by radiology.  Radiology deferred NG tube placement on this patient with restraints, and recommended placement under sedation or general anesthesia.  Hospital CMO and ethics involved. Finally, [facility] consulted. Post EGD and NG tube placement on 09/25/21 by Dr. Ardis Hughs.  Highly appreciate GI and Psychiatry team's assistance.  09/28/21: Patient was seen at her bedside.  She has no new complaints.  She states that she wants to go home.  Assessment/Plan: Principal Problem:   Recurrent hypoglycemia and severe malnutrition Active Problems:   Protein-calorie malnutrition, severe (HCC)   Schizophrenia, paranoid (HCC)   Hypokalemia, hypomagnesemia and hypophosphatemia   Metabolic acidosis   Hyponatremia   Hypophosphatemia   Hypomagnesemia    Hypokalemia  Resolved hypoglycemia Severe malnutrition status post EGD and NG tube placement on 09/25/2021 by GI. Appreciate GIs, Dr. Ardis Hughs, assistance.   EGD revealed minimal erythema in distal stomach, biopsied to check for H. pylori. Tube feeding initiated on 09/25/2021, continue Continue soft restraint for patient's own safety Aspiration precautions with head of bed elevated greater than 30 degrees when receiving tube feedings. Continue Remeron and thiamine supplement  Schizophrenia, paranoid ([place]) Patient brought to the hospital under IVC by family members.  IVC renewed on 3/3.  Deemed to lack capacity and met criteria for medication over objection and inpatient psychiatric hospitalization by psychiatry. -Had long-acting Haldol injection on 3/3.  She is also on IM/p.o. Haldol -Psych added Klonopin. -Cogentin and Ativan as needed -Continue soft restraints for patient's own safety. -Continue to follow further recommendation by psych.  -Per psych possible ECT and transfer to [facility], pending consent from the patient's sister.  Resolved transient bradycardia Unclear etiology Seen on 12 EKG done 09/24/2021 Obtain TSH, last TSH from 10/01/2020 was normal 2.647 Avoid AV nodal blocker agents Repeat twelve-lead EKG on 09/26/2021. Rate is 77 at the time of this dictation   Resolved metabolic acidosis Resolved.  DC'd IV fluids on 09/25/2021.   Resolved post repletion: Hypokalemia, hypomagnesemia and hypophosphatemia Serum potassium 3.8, magnesium 2.4, phosphorus 4.5.   Resolved post IV fluid hydration: Hypovolemic hyponatremia Completed IV fluid hydration on 09/25/2021 after placement of NG tube.     DVT prophylaxis:  enoxaparin (LOVENOX) injection 30 mg Start: 09/18/21 1000   Code Status: Full code  Family Communication: Updated patient's sister, legal guardian over the phone.   Level of care: Med-Surg  Status is: Inpatient Remains inpatient  appropriate because: Awaiting consent from  guardian to proceed with ECT and transfer to [facility].   Final disposition: Inpatient psychiatric hospital           Consultants:  Psychiatry General surgery GI IR Radiology       Status is: Inpatient Patient requires at least 2 midnights for further evaluation and treatment of present condition.    Objective: Vitals:   09/26/21 2259 09/27/21 1442 09/27/21 1953 09/28/21 0411  BP: 133/79 126/76 111/67   Pulse: 66 67 68   Resp: $Remo'16 15 16   'xmzuu$ Temp: 99.5 F (37.5 C) 98.3 F (36.8 C) 98.3 F (36.8 C)   TempSrc: Oral Oral Oral   SpO2: 98% 97% 98%   Weight:    43.3 kg  Height:        Intake/Output Summary (Last 24 hours) at 09/28/2021 1119 Last data filed at 09/28/2021 0600 Gross per 24 hour  Intake 930 ml  Output --  Net 930 ml   Filed Weights   09/20/21 0500 09/25/21 0852 09/28/21 0411  Weight: 42 kg 42 kg 43.3 kg    Exam: No significant change from prior exam.  General: 61 y.o. year-old female frail-appearing in no acute distress.  She is alert and oriented x3.  NG tube in place with ongoing tube feedings  Cardiovascular: Regular rate and rhythm no rubs or gallops. Respiratory: Clear to auscultation no wheezes or rales. Abdomen: Soft nontender normal bowel sounds present. Musculoskeletal: No lower extremity edema bilaterally. Skin: No ulcerative lesions noted. Psychiatry: Flat affect.   Data Reviewed: CBC: Recent Labs  Lab 09/22/21 1756 09/25/21 0515  WBC 4.5 4.6  HGB 13.2 12.4  HCT 39.7 37.0  MCV 86.3 86.2  PLT 172 989   Basic Metabolic Panel: Recent Labs  Lab 09/22/21 1756 09/23/21 0625 09/24/21 0610 09/24/21 1638 09/25/21 0515 09/25/21 1656 09/26/21 0510 09/26/21 1634 09/27/21 0630 09/28/21 0610  NA 135  --   --   --  136  --   --   --   --   --   K 3.5  --   --   --  3.8  --   --   --   --   --   CL 106  --   --   --  105  --   --   --   --   --   CO2 24  --   --   --  27  --   --   --   --   --   GLUCOSE 113*  --   --   --  101*  --    --   --   --   --   BUN <5*  --   --   --  <5*  --   --   --   --   --   CREATININE 0.57  --  0.68  --  0.62  --   --   --   --   --   CALCIUM 8.8*  --   --   --  8.4*  --   --   --   --   --   MG 1.9   < > 2.0   < > 2.1 2.3 2.4 2.5* 2.7* 2.6*  PHOS 2.9   < > 4.4   < > 4.2 4.0 4.5 4.2 4.2 4.3   < > = values in this interval not displayed.   GFR: Estimated  Creatinine Clearance: 51.1 mL/min (by C-G formula based on SCr of 0.62 mg/dL). Liver Function Tests: Recent Labs  Lab 09/22/21 1756 09/25/21 0515  AST 18 18  ALT 11 13  ALKPHOS 50 45  BILITOT 0.2* 0.3  PROT 6.8 5.8*  ALBUMIN 3.7 3.2*   No results for input(s): LIPASE, AMYLASE in the last 168 hours. No results for input(s): AMMONIA in the last 168 hours. Coagulation Profile: No results for input(s): INR, PROTIME in the last 168 hours. Cardiac Enzymes: No results for input(s): CKTOTAL, CKMB, CKMBINDEX, TROPONINI in the last 168 hours. BNP (last 3 results) No results for input(s): PROBNP in the last 8760 hours. HbA1C: No results for input(s): HGBA1C in the last 72 hours. CBG: Recent Labs  Lab 09/27/21 1156 09/27/21 1957 09/27/21 2351 09/28/21 0348 09/28/21 0805  GLUCAP 105* 133* 108* 136* 120*   Lipid Profile: No results for input(s): CHOL, HDL, LDLCALC, TRIG, CHOLHDL, LDLDIRECT in the last 72 hours. Thyroid Function Tests: Recent Labs    09/26/21 0510  TSH 1.073   Anemia Panel: No results for input(s): VITAMINB12, FOLATE, FERRITIN, TIBC, IRON, RETICCTPCT in the last 72 hours. Urine analysis:    Component Value Date/Time   COLORURINE STRAW (A) 03/25/2020 1608   APPEARANCEUR CLEAR 03/25/2020 1608   LABSPEC 1.004 (L) 03/25/2020 1608   PHURINE 6.0 03/25/2020 1608   GLUCOSEU NEGATIVE 03/25/2020 1608   HGBUR SMALL (A) 03/25/2020 1608   BILIRUBINUR NEGATIVE 03/25/2020 1608   KETONESUR 5 (A) 03/25/2020 1608   PROTEINUR NEGATIVE 03/25/2020 1608   UROBILINOGEN 0.2 04/10/2015 1111   NITRITE NEGATIVE 03/25/2020  1608   LEUKOCYTESUR NEGATIVE 03/25/2020 1608   Sepsis Labs: $[email or web address]'[id number]$ (procalcitonin:4,lacticidven:4)  )No results found for this or any previous visit (from the past 240 hour(s)).    Studies: No results found.  Scheduled Meds:  clonazePAM  0.5 mg Per Tube BID   enoxaparin (LOVENOX) injection  30 mg Subcutaneous Daily   feeding supplement (OSMOLITE 1.2 CAL)  1,000 mL Per Tube Q24H   free water  120 mL Per Tube Q6H   glucagon (human recombinant)  0.5 mg Intramuscular Once   mirtazapine  7.5 mg Per Tube QHS   multivitamin with minerals  1 tablet Per Tube Daily   thiamine injection  100 mg Intravenous Daily    Continuous Infusions:     LOS: 12 days     Kayleen Memos, MD [facility] Pager [phone]  If 7PM-7AM, please contact night-coverage [email or web address] Password [facility] 09/28/2021, 11:19 AM

## 2021-09-29 ENCOUNTER — Encounter (HOSPITAL_COMMUNITY): Payer: Self-pay | Admitting: Gastroenterology

## 2021-09-29 ENCOUNTER — Other Ambulatory Visit: Payer: Self-pay

## 2021-09-29 DIAGNOSIS — E162 Hypoglycemia, unspecified: Secondary | ICD-10-CM | POA: Diagnosis not present

## 2021-09-29 LAB — GLUCOSE, CAPILLARY
Glucose-Capillary: 120 mg/dL — ABNORMAL HIGH (ref 70–99)
Glucose-Capillary: 120 mg/dL — ABNORMAL HIGH (ref 70–99)
Glucose-Capillary: 133 mg/dL — ABNORMAL HIGH (ref 70–99)
Glucose-Capillary: 134 mg/dL — ABNORMAL HIGH (ref 70–99)
Glucose-Capillary: 137 mg/dL — ABNORMAL HIGH (ref 70–99)
Glucose-Capillary: 90 mg/dL (ref 70–99)

## 2021-09-29 MED ORDER — OSMOLITE 1.2 CAL PO LIQD: 1000.0000 mL | ORAL | Status: DC

## 2021-09-29 MED ORDER — OSMOLITE 1.2 CAL PO LIQD: 1000.0000 mL | ORAL | Status: AC

## 2021-09-29 MED ORDER — THIAMINE HCL 100 MG PO TABS
100.0000 mg | ORAL_TABLET | Freq: Every day | ORAL | Status: DC
  Administered 2021-09-30 – 2021-10-02 (×3): 100 mg
  Filled 2021-09-29 (×3): qty 1

## 2021-09-29 MED ORDER — SENNOSIDES-DOCUSATE SODIUM 8.6-50 MG PO TABS
2.0000 | ORAL_TABLET | Freq: Two times a day (BID) | ORAL | Status: DC
  Administered 2021-09-29 – 2021-10-02 (×6): 2
  Filled 2021-09-29 (×6): qty 2

## 2021-09-29 NOTE — Progress Notes (Signed)
Nutrition Follow-up ? ?DOCUMENTATION CODES:  ? ?Underweight ? ?INTERVENTION:  ? ?Continue to monitor magnesium, potassium, and phosphorus BID, MD to replete as needed, as pt is at severe risk for refeeding syndrome. Mg was low 3/12. ? ?-Continue IV Thiamine ? ?-Will plan to advance Osmolite 1.2 to 40 ml/hr today. ?-Advance by 10 ml every 24 hours to goal rate of 70 ml/hr via NGT ?-Continue MVI via tube and free water of 120 ml QID (480 ml). ?-Goal will provide 2016 kcals, 93g protein and 1857 ml H2O ? ? ?NUTRITION DIAGNOSIS:  ? ?Inadequate oral intake related to chronic illness (paranoid schizophrenia) as evidenced by meal completion < 25% (refusing PO intakes). ? ?Ongoing. ? ?GOAL:  ? ?Patient will meet greater than or equal to 90% of their needs ? ?Progressing with tube feeding ? ?MONITOR:  ? ?PO intake, Supplement acceptance, Labs, Weight trends, I & O's ? ? ?ASSESSMENT:  ? ?61 y.o. female with medical history significant of schizophrenia who was brought into the ED on 2/24 with IVC from family indicating that she has not been taking her medications, hearing voices and having hallucination and delusional thoughts. ? ?Patient tolerating Osmolite 1.2 @ 30 ml/hr over the weekend. Will advance to 40 ml/hr today. Can likely be advanced every 24 hours now. Would recommend continuing to check Mg/Phos/K daily. ? ?Admission weight: 93 lbs. ?3/12 weight: 95 lbs ? ?Medications: Remeron, Multivitamin with minerals daily, IV Thiamine ? ?Labs reviewed: ? CBGs: 90-134 ? ?Diet Order:   ?Diet Order   ? ?       ?  Diet NPO time specified  Diet effective ____       ?  ? ?  ?  ? ?  ? ? ?EDUCATION NEEDS:  ? ?Not appropriate for education at this time ? ?Skin:  Skin Assessment: Reviewed RN Assessment ? ?Last BM:  3/5 ? ?Height:  ? ?Ht Readings from Last 1 Encounters:  ?09/25/21 5\' 4"  (1.626 m)  ? ? ?Weight:  ? ?Wt Readings from Last 1 Encounters:  ?09/28/21 43.3 kg  ? ? ?BMI:  Body mass index is 16.39 kg/m?. ? ?Estimated Nutritional  Needs:  ? ?Kcal:  1900-2100 ? ?Protein:  100-115g ? ?Fluid:  2L/day ? ?11/28/21, MS, RD, LDN ?Inpatient Clinical Dietitian ?Contact information available via Amion ? ?

## 2021-09-29 NOTE — Consult Note (Cosign Needed)
[facility] Face to Face Consultation   Reason for Consult: Worsening psychosis,.  Referring Physician: Dr. Margo Aye   Patient Identification: [name] MRN:  [id number] Principal Diagnosis: Hypoglycemia Diagnosis:  Principal Problem:   Recurrent hypoglycemia and severe malnutrition Active Problems:   Schizophrenia, paranoid (HCC)   Hypokalemia, hypomagnesemia and hypophosphatemia   Protein-calorie malnutrition, severe (HCC)   Metabolic acidosis   Hyponatremia   Hypophosphatemia   Hypomagnesemia   Hypokalemia  Total Time Spent in Direct Patient Care: 30  Subjective: "I am just here. "  HPI:  [name] is a 61 y.o. female patient, was brought to the ED under IVC due to worsening psychosis in the wake of medication non-compliance. Patient has hx of Schizophrenia (chronic). Is known at the psychiatry from her previous admission for mood stabilizations treatments.   No changes at this time. Patient is observed to be lying in bed. NG tube is in place at this time, and patient appears to be complaint with treatment at this time. She continues with Recruitment consultant at bedside. Patietn continues to be adamant about not eating until she goes home. When advising patient that she is not progressing and we are going to place an ethics consult. She replies " Francesca Oman are making this bigger than what it is. Just let me go home and eat. You let some insiders come in and let them run the show. You hear their word and that Is all you hear. You dont believe that I was eating at home, they dont even know me, (referring to her sister and mother) ."   She is refusing to eat until she goes home, and acknowledges that she will not be able to go home until she eats.  She denies SI, HI, AVH.  Today, she does not make any paranoid comments.    Spoke with legal guardian Mitsy Owen, who has declined ECT treatment. She states family voted against and would like to pursue pharmacological treatment at this time. She states  they would prefer to give the medicine time to work. Verbalizes understanding at this time.   Past Psychiatric History: Schizophrenia.  Risk to Self: Yes, has been refusing p.o. is now receiving feedings via NG tube. Risk to Others: verbally aggressive. Prior Inpatient Therapy: [facility] x multiple times. Prior Outpatient Therapy: Yes.  Past Medical History:  Past Medical History:  Diagnosis Date   Non compliance w medication regimen    Schizophrenia (HCC)    History reviewed. No pertinent surgical history.  Family History: History reviewed. No pertinent family history.  Family Psychiatric  History: Refuses to answer.   Social History: Single. Social History   Substance and Sexual Activity  Alcohol Use Not Currently   Comment: Refuses to disclose how much     Social History   Substance and Sexual Activity  Drug Use No   Comment: Refuses to answer    Social History   Socioeconomic History   Marital status: Single    Spouse name: Not on file   Number of children: Not on file   Years of education: Not on file   Highest education level: Not on file  Occupational History   Not on file  Tobacco Use   Smoking status: Some Days    Packs/day: 0.50    Types: Cigarettes   Smokeless tobacco: Never  Vaping Use   Vaping Use: Every day  Substance and Sexual Activity   Alcohol use: Not Currently    Comment: Refuses to disclose how much  Drug use: No    Comment: Refuses to answer   Sexual activity: Never    Comment: refused to answer  Other Topics Concern   Not on file  Social History Narrative   Not on file   Social Determinants of Health   Financial Resource Strain: Not on file  Food Insecurity: Not on file  Transportation Needs: Not on file  Physical Activity: Not on file  Stress: Not on file  Social Connections: Not on file   Additional Social History:  Lives alone  Allergies:   Allergies  Allergen Reactions   Aspirin Hives and Other (See Comments)     Possibly caused hives, per sister   Ziprasidone Hcl Rash and Other (See Comments)    Caused twitching   Labs:  Results for orders placed or performed during the hospital encounter of 09/12/21 (from the past 48 hour(s))  Glucose, capillary     Status: Abnormal   Collection Time: 09/27/21 11:56 AM  Result Value Ref Range   Glucose-Capillary 105 (H) 70 - 99 mg/dL    Comment: Glucose reference range applies only to samples taken after fasting for at least 8 hours.   Comment 1 Notify RN    Comment 2 Document in Chart   Glucose, capillary     Status: Abnormal   Collection Time: 09/27/21  7:57 PM  Result Value Ref Range   Glucose-Capillary 133 (H) 70 - 99 mg/dL    Comment: Glucose reference range applies only to samples taken after fasting for at least 8 hours.  Glucose, capillary     Status: Abnormal   Collection Time: 09/27/21 11:51 PM  Result Value Ref Range   Glucose-Capillary 108 (H) 70 - 99 mg/dL    Comment: Glucose reference range applies only to samples taken after fasting for at least 8 hours.  Glucose, capillary     Status: Abnormal   Collection Time: 09/28/21  3:48 AM  Result Value Ref Range   Glucose-Capillary 136 (H) 70 - 99 mg/dL    Comment: Glucose reference range applies only to samples taken after fasting for at least 8 hours.  Magnesium     Status: Abnormal   Collection Time: 09/28/21  6:10 AM  Result Value Ref Range   Magnesium 2.6 (H) 1.7 - 2.4 mg/dL    Comment: Performed at [facility], [address], [place], [place] [address]  Phosphorus     Status: None   Collection Time: 09/28/21  6:10 AM  Result Value Ref Range   Phosphorus 4.3 2.5 - 4.6 mg/dL    Comment: Performed at [facility], [address] [place], [place] [address]  Glucose, capillary     Status: Abnormal   Collection Time: 09/28/21  8:05 AM  Result Value Ref Range   Glucose-Capillary 120 (H) 70 - 99 mg/dL    Comment: Glucose reference range applies only to  samples taken after fasting for at least 8 hours.  Glucose, capillary     Status: Abnormal   Collection Time: 09/28/21 12:05 PM  Result Value Ref Range   Glucose-Capillary 123 (H) 70 - 99 mg/dL    Comment: Glucose reference range applies only to samples taken after fasting for at least 8 hours.  Glucose, capillary     Status: Abnormal   Collection Time: 09/28/21  5:28 PM  Result Value Ref Range   Glucose-Capillary 100 (H) 70 - 99 mg/dL    Comment: Glucose reference range applies only to samples taken  after fasting for at least 8 hours.  Glucose, capillary     Status: Abnormal   Collection Time: 09/28/21  7:49 PM  Result Value Ref Range   Glucose-Capillary 121 (H) 70 - 99 mg/dL    Comment: Glucose reference range applies only to samples taken after fasting for at least 8 hours.   Comment 1 Notify RN    Comment 2 Document in Chart   Glucose, capillary     Status: Abnormal   Collection Time: 09/28/21 11:27 PM  Result Value Ref Range   Glucose-Capillary 111 (H) 70 - 99 mg/dL    Comment: Glucose reference range applies only to samples taken after fasting for at least 8 hours.   Comment 1 Notify RN    Comment 2 Document in Chart   Glucose, capillary     Status: None   Collection Time: 09/29/21  3:26 AM  Result Value Ref Range   Glucose-Capillary 90 70 - 99 mg/dL    Comment: Glucose reference range applies only to samples taken after fasting for at least 8 hours.   Comment 1 Notify RN    Comment 2 Document in Chart   Glucose, capillary     Status: Abnormal   Collection Time: 09/29/21 11:40 AM  Result Value Ref Range   Glucose-Capillary 134 (H) 70 - 99 mg/dL    Comment: Glucose reference range applies only to samples taken after fasting for at least 8 hours.   Medications:  Current Facility-Administered Medications  Medication Dose Route Frequency Provider Last Rate Last Admin   acetaminophen (TYLENOL) tablet 650 mg  650 mg Per Tube Q6H PRN Dow AdolphHall, Carole N, DO   650 mg at 09/26/21  1149   benztropine (COGENTIN) tablet 0.5 mg  0.5 mg Oral BID PRN Rachael FeeJacobs, Daniel P, MD       Or   benztropine mesylate (COGENTIN) injection 0.5 mg  0.5 mg Intramuscular BID PRN Rachael FeeJacobs, Daniel P, MD   0.5 mg at 09/15/21 2122   clonazePAM (KLONOPIN) tablet 0.5 mg  0.5 mg Per Tube BID Maryagnes AmosStarkes-Perry, Josey Forcier S, FNP   0.5 mg at 09/29/21 1035   dextrose 50 % solution 50 mL  1 ampule Intravenous PRN Rachael FeeJacobs, Daniel P, MD   50 mL at 09/16/21 0819   enoxaparin (LOVENOX) injection 30 mg  30 mg Subcutaneous Daily Rachael FeeJacobs, Daniel P, MD   30 mg at 09/26/21 1149   feeding supplement (OSMOLITE 1.2 CAL) liquid 1,000 mL  1,000 mL Per Tube Q24H Dow AdolphHall, Carole N, DO 30 mL/hr at 09/29/21 1039 1,000 mL at 09/29/21 1039   free water 120 mL  120 mL Per Tube Q6H Dow AdolphHall, Carole N, DO   120 mL at 09/29/21 0535   glucagon (human recombinant) (GLUCAGEN) injection 0.5 mg  0.5 mg Intramuscular Once Rachael FeeJacobs, Daniel P, MD       LORazepam (ATIVAN) injection 2 mg  2 mg Intramuscular Q6H PRN Rachael FeeJacobs, Daniel P, MD   2 mg at 09/15/21 2122   mirtazapine (REMERON SOL-TAB) disintegrating tablet 7.5 mg  7.5 mg Per Tube QHS Maryagnes AmosStarkes-Perry, Cherylann Hobday S, FNP   7.5 mg at 09/28/21 2217   multivitamin with minerals tablet 1 tablet  1 tablet Per Tube Daily Dow AdolphHall, Carole N, DO   1 tablet at 09/29/21 1035   thiamine (B-1) injection 100 mg  100 mg Intravenous Daily Rachael FeeJacobs, Daniel P, MD   100 mg at 09/29/21 1035   traZODone (DESYREL) tablet 50 mg  50 mg Per Tube QHS PRN Starkes-Perry,  Juel Burrow, FNP       Musculoskeletal: Strength & Muscle Tone: within normal limits Gait & Station: normal Patient leans: [name]  Psychiatric Specialty Exam:  Presentation  General Appearance: Casual  Eye Contact:Fair  Speech:Clear and Coherent  Speech Volume:Decreased  Handedness:Right   Mood and Affect  Mood:Irritable; Dysphoric  Affect:Congruent   Thought Process  Thought Processes:Other (comment) (concrete)  Descriptions of  Associations:Intact  Orientation:Other (comment) (refuses to answer)  Thought Content:Paranoid Ideation  History of Schizophrenia/Schizoaffective disorder:Yes  Duration of Psychotic Symptoms:Greater than six months  Hallucinations:Hallucinations: None   Ideas of Reference:None  Suicidal Thoughts:Suicidal Thoughts: No   Homicidal Thoughts:Homicidal Thoughts: No    Sensorium  Memory:Immediate Poor; Recent Poor; Remote Poor  Judgment:Impaired  Insight:Lacking  Executive Functions  Concentration:Poor  Attention Span:Fair  Recall:Poor  Fund of Knowledge:Fair  Language:Fair  Psychomotor Activity  Psychomotor Activity:No data recorded    Assets  Assets:Resilience  Sleep  Sleep:Sleep: Good    Physical Exam: Physical Exam Vitals and nursing note reviewed.  Constitutional:      Comments: Thin  HENT:     Nose: Nose normal.  Cardiovascular:     Rate and Rhythm: Normal rate.  Pulmonary:     Effort: Pulmonary effort is normal. No respiratory distress.  Musculoskeletal:        General: Normal range of motion.  Neurological:     General: No focal deficit present.     Mental Status: She is alert.   Review of Systems  Constitutional:  Negative for chills and fever.  HENT:  Negative for congestion and sore throat.   Respiratory:  Negative for cough, shortness of breath and wheezing.   Cardiovascular:  Negative for chest pain and palpitations.  Gastrointestinal:  Negative for heartburn, nausea and vomiting.  Neurological:  Negative for dizziness, tremors and seizures.  Endo/Heme/Allergies:        Allergie: ASA, Geodon  Psychiatric/Behavioral:  Negative for depression, hallucinations, substance abuse and suicidal ideas. The patient is not nervous/anxious and does not have insomnia.   Blood pressure 97/60, pulse (!) 55, temperature 98.7 F (37.1 C), temperature source Oral, resp. rate 15, height 5\' 4"  (1.626 m), weight 43.3 kg, SpO2 99 %. Body mass index is  16.39 kg/m.  Treatment Plan Summary: Daily contact with patient to assess and evaluate symptoms and progress in treatment and Medication management.   Patient did receive a long-acting injectable on March 2, has displayed no improvement in clinical presentation or the psychiatric symptoms.   -Will continue with previous recommendations from nutritionist, [facility].   -Will continue mirtazapine 7.5 mg p.o. nightly for depression, anxiety, appetite, and insomnia.   -Continue Klonopin 1 mg p.o. twice daily. -ECT declined. Will consult ethics.  -Will consider starting clozaril.  - Consider offering PO intake   -Psychiatry will continue to follow at this time.  Patient continues to meet inpatient psychiatric criteria, once medically stable.    02-26-1999, FNP 09/29/2021 12:06 PM

## 2021-09-29 NOTE — Progress Notes (Signed)
?   09/29/21 1505  ?Clinical Encounter Type  ?Visited With Patient;Health care provider  ?Visit Type Initial;Social support  ?Spiritual Encounters  ?Spiritual Needs Other (Comment) ?(not assessed)  ? ?[name] visited briefly with Pt. Pt was somewhat communicative and seemed lucid, expressed that she did not wish to receive any particular intervention. Chaplain Burris offered compassionate, non-anxious presence and offered forms of care that Pt may not have considered, such as the opportunity for chaplain to simply play music she might like to hear. ? ?Pt seemed very low and stated that she "just wanted to go home." Although she may mean return to family home, she may also attach other meanings to "home." Chaplain respected Pt's lack of desire to engage and offered to allow her to rest. Chaplain also offered to f/u later "to check-in" on Pt's well-being. ? ?[name] also Teacher, music and offered support for her as well. ?

## 2021-09-29 NOTE — Progress Notes (Signed)
PROGRESS NOTE  [name] CHE:[id number] DOB: July 17, 1961 DOA: 09/12/2021 PCP: [facility]  HPI/Recap of past 30 hours: 61 year old F with PMH of paranoid schizophrenia who presented to ED on 09/12/2021 under IVC from family due to not taking her meds, auditory hallucination and delusional thoughts.  She was in ED waiting on inpatient psychiatric hospitalization but became hypoglycemic due to refusal to eat or drink, and admitted for hypoglycemia.  She was started on D5 infusion but removing IV lines and IV replacement and oral intake.     Patient was evaluated by psychiatry.  She was deemed to lack capacity.  Legal guardian, sister consenting for treatment and interventions.   Patient was started on Haldol without significant improvement in her paranoia.  Continued to refuse IV placement and oral intake.  Only able to administer IM glucagon for recurrent hypoglycemia.  The decision was made for nasogastric feeding tube and soft restraints on 09/19/2021 after discussion with legal guardian. Unfortunately, ICU RN not able to advance small bore NGT.  General surgery, [facility] and IR recommended consulting radiology for fluoroscopy guided NG tube placement by radiology.  Radiology deferred NG tube placement on this patient with restraints, and recommended placement under sedation or general anesthesia.  Hospital CMO and ethics involved. Finally, [facility] consulted. Post EGD and NG tube placement on 09/25/21 by Dr. Ardis Hughs.  Highly appreciate GI and Psychiatry team's assistance.  09/29/21:  Patient was seen and examined at bedside.  Very unhappy to be here.  States she wants to go home.  Has declined to take food or drinks by mouth here in the hospital.  On tube feeding and tolerating well.  Assessment/Plan: Principal Problem:   Recurrent hypoglycemia and severe malnutrition Active Problems:   Protein-calorie malnutrition, severe (HCC)   Schizophrenia, paranoid (HCC)   Hypokalemia,  hypomagnesemia and hypophosphatemia   Metabolic acidosis   Hyponatremia   Hypophosphatemia   Hypomagnesemia   Hypokalemia  Resolved hypoglycemia Severe malnutrition status post EGD and NG tube placement on 09/25/2021 by GI. Appreciate GIs, Dr. Ardis Hughs, assistance with placement of NG tube.   EGD revealed minimal erythema in distal stomach, biopsied to check for H. pylori. Tube feeding initiated on 09/25/2021, continue Continue soft restraint for patient's own safety Aspiration precautions with head of bed elevated greater than 30 degrees when receiving tube feedings. Continue Remeron and thiamine supplement  Schizophrenia, paranoid ([name]) Patient brought to the hospital under IVC by family members.  IVC renewed on 3/3.  Deemed to lack capacity and met criteria for medication over objection and inpatient psychiatric hospitalization by psychiatry. -Had long-acting Haldol injection on 3/3.  She is also on IM/p.o. Haldol -Psych added Klonopin. -Cogentin and Ativan as needed -Continue soft restraints for patient's own safety. -Continue to follow further recommendation by psych.  -Family has declined ECT.  Unsafe discharge with NG tube.  Resolved transient bradycardia Unclear etiology Seen on 12 EKG done 09/24/2021 TSH normal 1.073 on 09/26/2021. Avoid AV nodal blocker agents Continue to monitor heart rate.   Resolved metabolic acidosis Resolved.  DC'd IV fluids on 09/25/2021.   Resolved post repletion: Hypokalemia, hypomagnesemia and hypophosphatemia Serum potassium 3.8, magnesium 2.4, phosphorus 4.5.   Resolved post IV fluid hydration: Hypovolemic hyponatremia Completed IV fluid hydration on 09/25/2021 after placement of NG tube.  Constipation Bowel regimen added Senokot 2 tablets twice daily.  DVT prophylaxis:  enoxaparin (LOVENOX) injection 30 mg Start: 09/18/21 1000   Code Status: Full code  Family Communication: Updated patient's sister, legal  guardian over the phone.   Level  of care: Med-Surg  Status is: Inpatient Remains inpatient appropriate because: Awaiting consent from guardian to proceed with ECT and transfer to [facility].   Final disposition: Inpatient psychiatric hospital           Consultants:  Psychiatry General surgery GI IR Radiology       Status is: Inpatient Patient requires at least 2 midnights for further evaluation and treatment of present condition.    Objective: Vitals:   09/27/21 1953 09/28/21 0411 09/28/21 1450 09/28/21 1950  BP: 111/67  98/67 97/60  Pulse: 68  60 (!) 55  Resp: $Remo'16  16 15  'bjCIc$ Temp: 98.3 F (36.8 C)  99 F (37.2 C) 98.7 F (37.1 C)  TempSrc: Oral  Oral Oral  SpO2: 98%  98% 99%  Weight:  43.3 kg    Height:        Intake/Output Summary (Last 24 hours) at 09/29/2021 1244 Last data filed at 09/29/2021 1100 Gross per 24 hour  Intake 1320 ml  Output --  Net 1320 ml   Filed Weights   09/20/21 0500 09/25/21 0852 09/28/21 0411  Weight: 42 kg 42 kg 43.3 kg    Exam:  General: 61 y.o. year-old female frail-appearing in no acute stress.  She is alert and oriented x3.   Cardiovascular: Regular rate and rhythm no rubs gallops. Respiratory: Clear to auscultation no wheezes or rales Abdomen: Soft nontender hypoactive bowel sounds present. Musculoskeletal: No lower extremity edema bilaterally. Skin: No ulcerative lesions noted. Psychiatry: Flat affect.   Data Reviewed: CBC: Recent Labs  Lab 09/22/21 1756 09/25/21 0515  WBC 4.5 4.6  HGB 13.2 12.4  HCT 39.7 37.0  MCV 86.3 86.2  PLT 172 381   Basic Metabolic Panel: Recent Labs  Lab 09/22/21 1756 09/23/21 0625 09/24/21 0610 09/24/21 1638 09/25/21 0515 09/25/21 1656 09/26/21 0510 09/26/21 1634 09/27/21 0630 09/28/21 0610  NA 135  --   --   --  136  --   --   --   --   --   K 3.5  --   --   --  3.8  --   --   --   --   --   CL 106  --   --   --  105  --   --   --   --   --   CO2 24  --   --   --  27  --   --   --   --   --   GLUCOSE 113*  --    --   --  101*  --   --   --   --   --   BUN <5*  --   --   --  <5*  --   --   --   --   --   CREATININE 0.57  --  0.68  --  0.62  --   --   --   --   --   CALCIUM 8.8*  --   --   --  8.4*  --   --   --   --   --   MG 1.9   < > 2.0   < > 2.1 2.3 2.4 2.5* 2.7* 2.6*  PHOS 2.9   < > 4.4   < > 4.2 4.0 4.5 4.2 4.2 4.3   < > = values in this interval not displayed.  GFR: Estimated Creatinine Clearance: 51.1 mL/min (by C-G formula based on SCr of 0.62 mg/dL). Liver Function Tests: Recent Labs  Lab 09/22/21 1756 09/25/21 0515  AST 18 18  ALT 11 13  ALKPHOS 50 45  BILITOT 0.2* 0.3  PROT 6.8 5.8*  ALBUMIN 3.7 3.2*   No results for input(s): LIPASE, AMYLASE in the last 168 hours. No results for input(s): AMMONIA in the last 168 hours. Coagulation Profile: No results for input(s): INR, PROTIME in the last 168 hours. Cardiac Enzymes: No results for input(s): CKTOTAL, CKMB, CKMBINDEX, TROPONINI in the last 168 hours. BNP (last 3 results) No results for input(s): PROBNP in the last 8760 hours. HbA1C: No results for input(s): HGBA1C in the last 72 hours. CBG: Recent Labs  Lab 09/28/21 1728 09/28/21 1949 09/28/21 2327 09/29/21 0326 09/29/21 1140  GLUCAP 100* 121* 111* 90 134*   Lipid Profile: No results for input(s): CHOL, HDL, LDLCALC, TRIG, CHOLHDL, LDLDIRECT in the last 72 hours. Thyroid Function Tests: No results for input(s): TSH, T4TOTAL, FREET4, T3FREE, THYROIDAB in the last 72 hours.  Anemia Panel: No results for input(s): VITAMINB12, FOLATE, FERRITIN, TIBC, IRON, RETICCTPCT in the last 72 hours. Urine analysis:    Component Value Date/Time   COLORURINE STRAW (A) 03/25/2020 1608   APPEARANCEUR CLEAR 03/25/2020 1608   LABSPEC 1.004 (L) 03/25/2020 1608   PHURINE 6.0 03/25/2020 1608   GLUCOSEU NEGATIVE 03/25/2020 1608   HGBUR SMALL (A) 03/25/2020 1608   BILIRUBINUR NEGATIVE 03/25/2020 1608   KETONESUR 5 (A) 03/25/2020 1608   PROTEINUR NEGATIVE 03/25/2020 1608    UROBILINOGEN 0.2 04/10/2015 1111   NITRITE NEGATIVE 03/25/2020 1608   LEUKOCYTESUR NEGATIVE 03/25/2020 1608   Sepsis Labs: $RemoveBefo'@[id number]'[id number]$ (procalcitonin:4,lacticidven:4)  )No results found for this or any previous visit (from the past 240 hour(s)).    Studies: No results found.  Scheduled Meds:  clonazePAM  0.5 mg Per Tube BID   enoxaparin (LOVENOX) injection  30 mg Subcutaneous Daily   feeding supplement (OSMOLITE 1.2 CAL)  1,000 mL Per Tube Q24H   free water  120 mL Per Tube Q6H   glucagon (human recombinant)  0.5 mg Intramuscular Once   mirtazapine  7.5 mg Per Tube QHS   multivitamin with minerals  1 tablet Per Tube Daily   senna-docusate  2 tablet Oral BID   thiamine injection  100 mg Intravenous Daily    Continuous Infusions:     LOS: 13 days     Kayleen Memos, MD [facility] Pager [phone]  If 7PM-7AM, please contact night-coverage [email or web address] Password [facility] 09/29/2021, 12:44 PM

## 2021-09-30 DIAGNOSIS — E162 Hypoglycemia, unspecified: Secondary | ICD-10-CM | POA: Diagnosis not present

## 2021-09-30 LAB — GLUCOSE, CAPILLARY
Glucose-Capillary: 104 mg/dL — ABNORMAL HIGH (ref 70–99)
Glucose-Capillary: 108 mg/dL — ABNORMAL HIGH (ref 70–99)
Glucose-Capillary: 120 mg/dL — ABNORMAL HIGH (ref 70–99)
Glucose-Capillary: 120 mg/dL — ABNORMAL HIGH (ref 70–99)
Glucose-Capillary: 127 mg/dL — ABNORMAL HIGH (ref 70–99)
Glucose-Capillary: 96 mg/dL (ref 70–99)

## 2021-09-30 MED ORDER — OSMOLITE 1.2 CAL PO LIQD
1000.0000 mL | ORAL | Status: DC
  Administered 2021-10-01: 1000 mL

## 2021-09-30 MED ORDER — OSMOLITE 1.2 CAL PO LIQD: 1000.0000 mL | ORAL | Status: AC

## 2021-09-30 NOTE — Care Management Important Message (Signed)
Important Message ? ?Patient Details IM Letter placed in Patients room. ?Name: [name] ?MRN: [id number] ?Date of Birth: [birth year] ? ? ?Medicare Important Message Given:  Yes ? ? ? ? ?Caren Macadam ?09/30/2021, 9:11 AM ?

## 2021-09-30 NOTE — Progress Notes (Signed)
Nutrition Follow-up ? ?DOCUMENTATION CODES:  ? ?Underweight ? ?INTERVENTION:  ? ?-Continue to monitor magnesium, potassium, and phosphorus BID, MD to replete as needed, as pt is at severe risk for refeeding syndrome. Mg was low 3/12. ?  ?-Continue Thiamine via tube ? ?-Checking Mg and Phos levels in AM ?  ?-Will continue Osmolite 1.2 @ 40 ml/hr today ? ?Tomorrow 3/15: ?-At 1300: Advance by 10 ml 50 ml/hr via NGT ?-Continue MVI via tube and free water of 120 ml QID (480 ml). ?-Goal rate is 70 ml/hr ?-Goal will provide 2016 kcals, 93g protein and 1857 ml H2O ? ? ?NUTRITION DIAGNOSIS:  ? ?Inadequate oral intake related to chronic illness (paranoid schizophrenia) as evidenced by meal completion < 25% (refusing PO intakes). ? ?Ongoing. ? ?GOAL:  ? ?Patient will meet greater than or equal to 90% of their needs ? ?At current rate, meeting 60% of kcal needs and 53% of protein needs ? ?MONITOR:  ? ?PO intake, Supplement acceptance, Labs, Weight trends, I & O's ? ? ?ASSESSMENT:  ? ?61 y.o. female with medical history significant of schizophrenia who was brought into the ED on 2/24 with IVC from family indicating that she has not been taking her medications, hearing voices and having hallucination and delusional thoughts. ? ?Patient currently tolerating Osmolite 1.2 @ 40 ml/hr. Will continue at 40 ml/hr until tomorrow 3/15 at 1259. At 1300, will advance to 50 ml/hr.  ? ?Pt continues to refuse PO intakes. ? ?Admission weight : 93 lbs. ?3/12 weight: 95 lbs ? ?Medications: Remeron, Multivitamin with minerals daily, Thiamine ? ?Labs reviewed: ? CBGs: 104-134 ? ?Diet Order:   ?Diet Order   ? ?       ?  Diet regular Room service appropriate? Yes; Fluid consistency: Thin  Diet effective now       ?  ? ?  ?  ? ?  ? ? ?EDUCATION NEEDS:  ? ?Not appropriate for education at this time ? ?Skin:  Skin Assessment: Reviewed RN Assessment ? ?Last BM:  3/5 ? ?Height:  ? ?Ht Readings from Last 1 Encounters:  ?09/25/21 5\' 4"  (1.626 m)   ? ? ?Weight:  ? ?Wt Readings from Last 1 Encounters:  ?09/28/21 43.3 kg  ? ? ?BMI:  Body mass index is 16.39 kg/m?. ? ?Estimated Nutritional Needs:  ? ?Kcal:  1900-2100 ? ?Protein:  100-115g ? ?Fluid:  2L/day ? ? ?Clayton Bibles, MS, RD, LDN ?Inpatient Clinical Dietitian ?Contact information available via Amion ? ?

## 2021-09-30 NOTE — Progress Notes (Signed)
? ?PROGRESS NOTE ? ?[name] NHA:[id number] DOB: August 06, 1960 DOA: 09/12/2021 ?PCP: [facility] ? ?HPI/Recap of past 59 hours: ?61 year old F with PMH of paranoid schizophrenia who presented to ED on 09/12/2021 under IVC from family due to not taking her meds, auditory hallucination and delusional thoughts.  She was in ED waiting on inpatient psychiatric hospitalization but became hypoglycemic due to refusal to eat or drink, and admitted for hypoglycemia.  She was started on D5 infusion but removing IV lines and IV replacement and oral intake.   ?  ?Patient was evaluated by psychiatry.  She was deemed to lack capacity.  Legal guardian, sister consenting for treatment and interventions.   ?Patient was started on Haldol without significant improvement in her paranoia.  Continued to refuse IV placement and oral intake.  Only able to administer IM glucagon for recurrent hypoglycemia.  The decision was made for nasogastric feeding tube and soft restraints on 09/19/2021 after discussion with legal guardian. Unfortunately, ICU RN not able to advance small bore NGT.  General surgery, [facility] and IR recommended consulting radiology for fluoroscopy guided NG tube placement by radiology.  Radiology deferred NG tube placement on this patient with restraints, and recommended placement under sedation or general anesthesia.  Hospital CMO and ethics involved. Finally, [facility] consulted. Post EGD and NG tube placement on 09/25/21 by Dr. Ardis Hughs.  Highly appreciate GI and Psychiatry team's assistance.  Patient's medical POA who is also her sister has declined ECT.  Family wants to continue medical management. ? ?09/30/21:  Patient seen at her bedside.  One-to-one sitter in the room.  Still declines to eat or drink anything by mouth. ? ?Assessment/Plan: ?Principal Problem: ?  Recurrent hypoglycemia and severe malnutrition ?Active Problems: ?  Protein-calorie malnutrition, severe ([place]) ?  Schizophrenia, paranoid ([place]) ?   Hypokalemia, hypomagnesemia and hypophosphatemia ?  Metabolic acidosis ?  Hyponatremia ?  Hypophosphatemia ?  Hypomagnesemia ?  Hypokalemia ? ?Resolved hypoglycemia ?Severe malnutrition status post EGD and NG tube placement on 09/25/2021 by GI. ?Appreciate GIs, Dr. Ardis Hughs, assistance with placement of NG tube.   ?EGD revealed minimal erythema in distal stomach, biopsied to check for H. pylori. ?Tube feeding initiated on 09/25/2021, continue ?Continue soft restraint for patient's own safety ?Aspiration precautions with head of bed elevated greater than 30 degrees when receiving tube feedings. ?Continue Remeron and thiamine supplement ? ?Schizophrenia, paranoid ([place]) ?Patient brought to the hospital under IVC by family members.  IVC renewed on 3/3.  Deemed to lack capacity and met criteria for medication over objection and inpatient psychiatric hospitalization by psychiatry. ?-Had long-acting Haldol injection on 3/3.  She is also on IM/p.o. Haldol ?-Psych added Klonopin. ?-Cogentin and Ativan as needed ?-Continue soft restraints for patient's own safety. ?-Continue to follow further recommendation by psych.  ?-Family has declined ECT.  Unsafe discharge with NG tube. ? ?Resolved transient bradycardia ?Unclear etiology ?Seen on 12 EKG done 09/24/2021 ?TSH normal 1.073 on 09/26/2021. ?Avoid AV nodal blocker agents ?Continue to monitor heart rate. ?  ?Resolved metabolic acidosis ?Resolved.  DC'd IV fluids on 09/25/2021. ?  ?Resolved post repletion: Hypokalemia, hypomagnesemia and hypophosphatemia ?Serum potassium 3.8, magnesium 2.4, phosphorus 4.5. ?  ?Resolved post IV fluid hydration: Hypovolemic hyponatremia ?Completed IV fluid hydration on 09/25/2021 after placement of NG tube. ? ?Constipation ?Bowel regimen added Senokot 2 tablets twice daily. ? ?DVT prophylaxis:  ?enoxaparin (LOVENOX) injection 30 mg Start: 09/18/21 1000 ?  ?Code Status: Full code ? ?Family Communication: Updated patient's sister, legal guardian  over the  phone. ? ? ?Level of care: Med-Surg ? ?Status is: Inpatient ?Remains inpatient appropriate because: Awaiting consent from guardian to proceed with ECT and transfer to [facility]. ?  ?Final disposition: Inpatient psychiatric hospital ?  ?  ?  ?  ?  ?Consultants:  ?Psychiatry ?General surgery ?GI ?IR ?Radiology ?  ? ? ? ? ?Status is: Inpatient ?Patient requires at least 2 midnights for further evaluation and treatment of present condition. ? ? ? ?Objective: ?Vitals:  ? 09/28/21 1950 09/29/21 1357 09/29/21 1954 09/30/21 0415  ?BP: 97/60 (!) 101/59 (!) 97/52 (!) 99/56  ?Pulse: (!) 55 (!) 58 (!) 56 (!) 55  ?Resp: _0 ?Temp: 98.7 ?F (37.1 ?C) 98.6 ?F (37 ?C) 98.5 ?F (36.9 ?C) 97.7 ?F (36.5 ?C)  ?TempSrc: Oral Oral Oral Oral  ?SpO2: 99% 99% 99% 98%  ?Weight:      ?Height:      ? ? ?Intake/Output Summary (Last 24 hours) at 09/30/2021 1140 ?Last data filed at 09/29/2021 2023 ?Gross per 24 hour  ?Intake 563 ml  ?Output --  ?Net 563 ml  ? ?Filed Weights  ? 09/20/21 0500 09/25/21 0852 09/28/21 0411  ?Weight: 42 kg 42 kg 43.3 kg  ? ? ?Exam: No significant changes from prior exam. ? ?General: 61 y.o. year-old female frail-appearing in no acute stress.  She is alert and oriented x3.   ?Cardiovascular: Regular rate and rhythm no rubs gallops. ?Respiratory: Clear to auscultation no wheezes or rales ?Abdomen: Soft nontender hypoactive bowel sounds present. ?Musculoskeletal: No lower extremity edema bilaterally. ?Skin: No ulcerative lesions noted. ?Psychiatry: Flat affect. ? ? ?Data Reviewed: ?CBC: ?Recent Labs  ?Lab 09/25/21 ?0515  ?WBC 4.6  ?HGB 12.4  ?HCT 37.0  ?MCV 86.2  ?PLT 168  ? ?Basic Metabolic Panel: ?Recent Labs  ?Lab 09/24/21 ?0610 09/24/21 ?1638 09/25/21 ?9211 09/25/21 ?1656 09/26/21 ?0510 09/26/21 ?1634 09/27/21 ?0630 09/28/21 ?9417  ?NA  --   --  136  --   --   --   --   --   ?K  --   --  3.8  --   --   --   --   --   ?CL  --   --  105  --   --   --   --   --   ?CO2  --   --  27  --   --   --   --   --   ?GLUCOSE  --    --  101*  --   --   --   --   --   ?BUN  --   --  <5*  --   --   --   --   --   ?CREATININE 0.68  --  0.62  --   --   --   --   --   ?CALCIUM  --   --  8.4*  --   --   --   --   --   ?MG 2.0   < > 2.1 2.3 2.4 2.5* 2.7* 2.6*  ?PHOS 4.4   < > 4.2 4.0 4.5 4.2 4.2 4.3  ? < > = values in this interval not displayed.  ? ?GFR: ?Estimated Creatinine Clearance: 51.1 mL/min (by C-G formula based on SCr of 0.62 mg/dL). ?Liver Function Tests: ?Recent Labs  ?Lab 09/25/21 ?0515  ?AST 18  ?ALT 13  ?ALKPHOS 45  ?BILITOT 0.3  ?PROT 5.8*  ?  ALBUMIN 3.2*  ? ?No results for input(s): LIPASE, AMYLASE in the last 168 hours. ?No results for input(s): AMMONIA in the last 168 hours. ?Coagulation Profile: ?No results for input(s): INR, PROTIME in the last 168 hours. ?Cardiac Enzymes: ?No results for input(s): CKTOTAL, CKMB, CKMBINDEX, TROPONINI in the last 168 hours. ?BNP (last 3 results) ?No results for input(s): PROBNP in the last 8760 hours. ?HbA1C: ?No results for input(s): HGBA1C in the last 72 hours. ?CBG: ?Recent Labs  ?Lab 09/29/21 ?1140 09/29/21 ?1604 09/29/21 ?2349 09/30/21 ?2091 09/30/21 ?0681  ?GLUCAP 134* 120* 120* 120* 127*  ? ?Lipid Profile: ?No results for input(s): CHOL, HDL, LDLCALC, TRIG, CHOLHDL, LDLDIRECT in the last 72 hours. ?Thyroid Function Tests: ?No results for input(s): TSH, T4TOTAL, FREET4, T3FREE, THYROIDAB in the last 72 hours. ? ?Anemia Panel: ?No results for input(s): VITAMINB12, FOLATE, FERRITIN, TIBC, IRON, RETICCTPCT in the last 72 hours. ?Urine analysis: ?   ?Component Value Date/Time  ? COLORURINE STRAW (A) 03/25/2020 1608  ? APPEARANCEUR CLEAR 03/25/2020 1608  ? LABSPEC 1.004 (L) 03/25/2020 1608  ? PHURINE 6.0 03/25/2020 1608  ? GLUCOSEU NEGATIVE 03/25/2020 1608  ? HGBUR SMALL (A) 03/25/2020 1608  ? [place] NEGATIVE 03/25/2020 1608  ? KETONESUR 5 (A) 03/25/2020 1608  ? PROTEINUR NEGATIVE 03/25/2020 1608  ? UROBILINOGEN 0.2 04/10/2015 1111  ? NITRITE NEGATIVE 03/25/2020 1608  ? LEUKOCYTESUR NEGATIVE  03/25/2020 1608  ? ?Sepsis Labs: ?_0 (procalcitonin:4,lacticidven:4) ? ?)No results found for this or any previous visit (from the past 240 hour(s)).  ? ? ?Studies: ?No results found. ? ?Scheduled Meds: ? cl

## 2021-09-30 NOTE — Consult Note (Signed)
?  Patient seen and attempted to reassess. Patient does not wish to engage with this nurse practitioner.  Her conversation is very limited.  Patient denies any acute concerns, besides her request" I just want to go home."  She does not choose to speak outside of that. ? ?Plan is to schedule family meeting on Thursday, however awaiting to see [facility] hospitalist covering, prior to scheduling meeting. ? ?-Ethics consult has been placed, no updates. ?-Patient also visited by a chaplain, although she did not wish to receive any interventions or engage with chaplain. ? ?Psychiatry will continue to follow although patient does not appear to be progressing at this time.   ? ?She continues to receive tube feedings, which has been increased from 30 mL to 40 mL/h. ? ?No charge. ?

## 2021-10-01 LAB — COMPREHENSIVE METABOLIC PANEL
ALT: 9 U/L (ref 0–44)
AST: 14 U/L — ABNORMAL LOW (ref 15–41)
Albumin: 3.4 g/dL — ABNORMAL LOW (ref 3.5–5.0)
Alkaline Phosphatase: 50 U/L (ref 38–126)
Anion gap: 8 (ref 5–15)
BUN: 15 mg/dL (ref 6–20)
CO2: 29 mmol/L (ref 22–32)
Calcium: 8.8 mg/dL — ABNORMAL LOW (ref 8.9–10.3)
Chloride: 102 mmol/L (ref 98–111)
Creatinine, Ser: 0.57 mg/dL (ref 0.44–1.00)
GFR, Estimated: >60 mL/min (ref >60)
Glucose, Bld: 127 mg/dL — ABNORMAL HIGH (ref 70–99)
Potassium: 4.1 mmol/L (ref 3.5–5.1)
Sodium: 139 mmol/L (ref 135–145)
Total Bilirubin: 0.1 mg/dL — ABNORMAL LOW (ref 0.3–1.2)
Total Protein: 6.5 g/dL (ref 6.5–8.1)

## 2021-10-01 LAB — GLUCOSE, CAPILLARY
Glucose-Capillary: 100 mg/dL — ABNORMAL HIGH (ref 70–99)
Glucose-Capillary: 102 mg/dL — ABNORMAL HIGH (ref 70–99)
Glucose-Capillary: 104 mg/dL — ABNORMAL HIGH (ref 70–99)
Glucose-Capillary: 106 mg/dL — ABNORMAL HIGH (ref 70–99)
Glucose-Capillary: 109 mg/dL — ABNORMAL HIGH (ref 70–99)
Glucose-Capillary: 123 mg/dL — ABNORMAL HIGH (ref 70–99)
Glucose-Capillary: 139 mg/dL — ABNORMAL HIGH (ref 70–99)

## 2021-10-01 LAB — CBC
HCT: 36.1 % (ref 36.0–46.0)
Hemoglobin: 11.8 g/dL — ABNORMAL LOW (ref 12.0–15.0)
MCH: 28.6 pg (ref 26.0–34.0)
MCHC: 32.7 g/dL (ref 30.0–36.0)
MCV: 87.4 fL (ref 80.0–100.0)
Platelets: 268 10*3/uL (ref 150–400)
RBC: 4.13 MIL/uL (ref 3.87–5.11)
RDW: 12.5 % (ref 11.5–15.5)
WBC: 4.9 10*3/uL (ref 4.0–10.5)
nRBC: 0 % (ref 0.0–0.2)

## 2021-10-01 LAB — PHOSPHORUS: Phosphorus: 4.1 mg/dL (ref 2.5–4.6)

## 2021-10-01 LAB — MAGNESIUM: Magnesium: 2.4 mg/dL (ref 1.7–2.4)

## 2021-10-01 NOTE — Progress Notes (Signed)
? ?PROGRESS NOTE ? ?[name] [id number] DOB: 1961/03/24 DOA: 09/12/2021 ?PCP: [facility] ? ?HPI/Recap of past 4324 hours: ?61 year old F with PMH of paranoid schizophrenia who presented to ED on 09/12/2021 under IVC from family due to not taking her meds, auditory hallucination and delusional thoughts.   became hypoglycemic due to refusal to eat or drink, and admitted for hypoglycemia.  started on D5 infusion but removing IV lines and IV replacement and oral intake.   ? ?evaluated by psychiatry-- lacks capacity.  Legal guardian, sister consenting for treatment and interventions.   ?started on Haldol without significant improvement in her paranoia.   ?Continued to refuse IV placement and oral intake.   ? decision was made for nasogastric feeding tube and soft restraints on 09/19/2021 after discussion with legal guardian. Unfortunately, ICU RN not able to advance small bore NGT.   ?General surgery, [facility] and IR recommended consulting radiology for fluoroscopy guided NG tube placement by radiology.  Radiology deferred NG tube placement on this patient with restraints, and recommended placement under sedation or general anesthesia.   ?Hospital CMO and ethics involved.  ?[facility] consulted. ? Post EGD and NG tube placement on 09/25/21 by Dr. Christella HartiganJacobs.   ?Patient's medical POA who is also her sister has declined ECT.  ? Family wants to continue medical management. ? ?SUBJ ?Withdrawn ?Folds hands  ?"I wanna go home" ?Refuses to engage further ?Been OOB x 1 today to urinate ? ?Assessment/Plan: ?Principal Problem: ?  Recurrent hypoglycemia and severe malnutrition ?Active Problems: ?  Protein-calorie malnutrition, severe ([facility]) ?  Schizophrenia, paranoid ([facility]) ?  Hypokalemia, hypomagnesemia and hypophosphatemia ?  Metabolic acidosis ?  Hyponatremia ?  Hypophosphatemia ?  Hypomagnesemia ?  Hypokalemia ? ?Resolved hypoglycemia ?Severe malnutrition status post EGD and NG tube placement on 09/25/2021 by GI. ?Appreciate  GIs, Dr. Christella HartiganJacobs, assistance with placement of NG tube.   ?EGD revealed minimal erythema in distal stomach, biopsied to check for H. pylori. ?Tube feeding initiated on 09/25/2021, continue ?Continue soft restraint for patient's own safety ?Aspiration precautions ?Continue Remeron and thiamine supplement ? ?Schizophrenia, paranoid ([facility]) ? IVC renewed on 3/3.  needs inpatient psychiatric hospitalization by psychiatry. ?-Had long-acting Haldol injection on 3/3.  She is also on IM/p.o. Haldol ?-Psych added Klonopin. ?-Cogentin and Ativan as needed ?-Continue soft restraints for patient's own safety. ?-Continue to follow further recommendation by psych.  ?-Family has declined ECT.  Unsafe discharge with NG tube. ? ?Resolved transient bradycardia ?Unclear etiology ?Seen on 12 EKG done 09/24/2021 ?TSH normal 1.073 on 09/26/2021. ?Avoid AV nodal blocker agents ?Continue to monitor heart rate. ?  ?Resolved metabolic acidosis ?Resolved.  DC'd IV fluids on 09/25/2021. ?  ?Resolved post repletion: Hypokalemia, hypomagnesemia and hypophosphatemia ?Serum potassium 4.1, magnesium 2.4, phosphorus 4.1. ?  ?Resolved post IV fluid hydration: Hypovolemic hyponatremia ?Completed IV fluid hydration on 09/25/2021 after placement of NG tube. ? ?Constipation ?Bowel regimen added Senokot 2 tablets twice daily. ? ?DVT prophylaxis:  ?enoxaparin (LOVENOX) injection 30 mg Start: 09/18/21 1000 ?  ?Code Status: Full code ? ?Family Communication: Updated patient's sister, legal guardian over the phone. ? ? ?Level of care: Med-Surg ? ?Status is: Inpatient ?Remains inpatient appropriate because: Awaiting consent from guardian to proceed with ECT [facility] ?  ?Final disposition: Inpatient psychiatric hospital ?  ?  ?  ?  ?  ?Consultants:  ?Psychiatry ?General surgery ?GI ?IR ?Radiology ?  ? ? ? ? ?Status is: Inpatient ?Patient requires at least 2 midnights  for further evaluation and treatment of present condition. ? ? ? ?Objective: ?Vitals:  ?  09/30/21 1421 09/30/21 1920 10/01/21 0407 10/01/21 1315  ?BP: (!) 92/57 (!) 89/51 (!) 98/55 (!) 90/51  ?Pulse: (!) 54 (!) 53 (!) 58 (!) 55  ?Resp: 16 15 15 15   ?Temp: 98.3 ?F (36.8 ?C) 98.4 ?F (36.9 ?C) (!) 97.5 ?F (36.4 ?C) 98.1 ?F (36.7 ?C)  ?TempSrc: Oral Oral Oral Axillary  ?SpO2: 100% 99% 99% 98%  ?Weight:      ?Height:      ? ? ?Intake/Output Summary (Last 24 hours) at 10/01/2021 1908 ?Last data filed at 10/01/2021 10/03/2021 ?Gross per 24 hour  ?Intake 1585 ml  ?Output --  ?Net 1585 ml  ? ? ?Filed Weights  ? 09/20/21 0500 09/25/21 0852 09/28/21 0411  ?Weight: 42 kg 42 kg 43.3 kg  ? ? ?Exam: No significant changes from prior exam. ? ?Withdrawn BF, cachectic ?Cta b no rales rhonchi ?Abd soft nt nd no rebound no guard ?Neuro intact to power ? ? ?Data Reviewed: ?CBC: ?Recent Labs  ?Lab 09/25/21 ?11/25/21 10/01/21 ?10/03/21  ?WBC 4.6 4.9  ?HGB 12.4 11.8*  ?HCT 37.0 36.1  ?MCV 86.2 87.4  ?PLT 168 268  ? ? ?Basic Metabolic Panel: ?Recent Labs  ?Lab 09/25/21 ?11/25/21 09/25/21 ?1656 09/26/21 ?0510 09/26/21 ?1634 09/27/21 ?0630 09/28/21 ?11/28/21 10/01/21 ?10/03/21  ?NA 136  --   --   --   --   --  139  ?K 3.8  --   --   --   --   --  4.1  ?CL 105  --   --   --   --   --  102  ?CO2 27  --   --   --   --   --  29  ?GLUCOSE 101*  --   --   --   --   --  127*  ?BUN <5*  --   --   --   --   --  15  ?CREATININE 0.62  --   --   --   --   --  0.57  ?CALCIUM 8.4*  --   --   --   --   --  8.8*  ?MG 2.1   < > 2.4 2.5* 2.7* 2.6* 2.4  ?PHOS 4.2   < > 4.5 4.2 4.2 4.3 4.1  ? < > = values in this interval not displayed.  ? ? ?GFR: ?Estimated Creatinine Clearance: 51.1 mL/min (by C-G formula based on SCr of 0.57 mg/dL). ?Liver Function Tests: ?Recent Labs  ?Lab 09/25/21 ?11/25/21 10/01/21 ?10/03/21  ?AST 18 14*  ?ALT 13 9  ?ALKPHOS 45 50  ?BILITOT 0.3 0.1*  ?PROT 5.8* 6.5  ?ALBUMIN 3.2* 3.4*  ? ? ?No results for input(s): LIPASE, AMYLASE in the last 168 hours. ?No results for input(s): AMMONIA in the last 168 hours. ?Coagulation Profile: ?No results for input(s): INR,  PROTIME in the last 168 hours. ?Cardiac Enzymes: ?No results for input(s): CKTOTAL, CKMB, CKMBINDEX, TROPONINI in the last 168 hours. ?BNP (last 3 results) ?No results for input(s): PROBNP in the last 8760 hours. ?HbA1C: ?No results for input(s): HGBA1C in the last 72 hours. ?CBG: ?Recent Labs  ?Lab 09/30/21 ?2356 10/01/21 ?0406 10/01/21 ?10/03/21 10/01/21 ?1311 10/01/21 ?1634  ?GLUCAP 108* 102* 123* 100* 109*  ? ? ?Lipid Profile: ?No results for input(s): CHOL, HDL, LDLCALC, TRIG, CHOLHDL, LDLDIRECT in the last 72 hours. ?Thyroid Function Tests: ?No results for  input(s): TSH, T4TOTAL, FREET4, T3FREE, THYROIDAB in the last 72 hours. ? ?Anemia Panel: ?No results for input(s): VITAMINB12, FOLATE, FERRITIN, TIBC, IRON, RETICCTPCT in the last 72 hours. ?Urine analysis: ?   ?Component Value Date/Time  ? COLORURINE STRAW (A) 03/25/2020 1608  ? APPEARANCEUR CLEAR 03/25/2020 1608  ? LABSPEC 1.004 (L) 03/25/2020 1608  ? PHURINE 6.0 03/25/2020 1608  ? GLUCOSEU NEGATIVE 03/25/2020 1608  ? HGBUR SMALL (A) 03/25/2020 1608  ? BILIRUBINUR NEGATIVE 03/25/2020 1608  ? KETONESUR 5 (A) 03/25/2020 1608  ? PROTEINUR NEGATIVE 03/25/2020 1608  ? UROBILINOGEN 0.2 04/10/2015 1111  ? NITRITE NEGATIVE 03/25/2020 1608  ? LEUKOCYTESUR NEGATIVE 03/25/2020 1608  ? ?Sepsis Labs: ?@[email or web address] (procalcitonin:4,lacticidven:4) ? ?)No results found for this or any previous visit (from the past 240 hour(s)).  ? ? ?Studies: ?No results found. ? ?Scheduled Meds: ? clonazePAM  0.5 mg Per Tube BID  ? enoxaparin (LOVENOX) injection  30 mg Subcutaneous Daily  ? free water  120 mL Per Tube Q6H  ? glucagon (human recombinant)  0.5 mg Intramuscular Once  ? mirtazapine  7.5 mg Per Tube QHS  ? multivitamin with minerals  1 tablet Per Tube Daily  ? senna-docusate  2 tablet Per Tube BID  ? thiamine  100 mg Per Tube Daily  ? ? ?Continuous Infusions: ? feeding supplement (OSMOLITE 1.2 CAL) 1,000 mL (10/01/21 1300)  ? ? ? ? LOS: 15 days  ?10/03/21, MD ?[facility]  Hospitalist ?7:14 PM ? ?If 7PM-7AM, please contact night-coverage ?[email or web address] ?Password [email or web address] ?10/01/2021, 7:08 PM  ?  ?

## 2021-10-02 ENCOUNTER — Inpatient Hospital Stay: Payer: Medicare Other

## 2021-10-02 ENCOUNTER — Inpatient Hospital Stay
Admission: AD | Admit: 2021-10-02 | Discharge: 2021-10-17 | DRG: 885 | Disposition: A | Discharge status: Other Acute Inpatient Hospital | Payer: Medicare Other | Source: Other Acute Inpatient Hospital | Attending: Internal Medicine | Admitting: Internal Medicine

## 2021-10-02 DIAGNOSIS — E8809 Other disorders of plasma-protein metabolism, not elsewhere classified: Secondary | ICD-10-CM | POA: Diagnosis present

## 2021-10-02 DIAGNOSIS — I9589 Other hypotension: Secondary | ICD-10-CM | POA: Diagnosis not present

## 2021-10-02 DIAGNOSIS — R339 Retention of urine, unspecified: Secondary | ICD-10-CM | POA: Diagnosis not present

## 2021-10-02 DIAGNOSIS — F203 Undifferentiated schizophrenia: Secondary | ICD-10-CM | POA: Diagnosis not present

## 2021-10-02 DIAGNOSIS — K2970 Gastritis, unspecified, without bleeding: Secondary | ICD-10-CM | POA: Diagnosis present

## 2021-10-02 DIAGNOSIS — R001 Bradycardia, unspecified: Secondary | ICD-10-CM | POA: Diagnosis present

## 2021-10-02 DIAGNOSIS — D638 Anemia in other chronic diseases classified elsewhere: Secondary | ICD-10-CM | POA: Diagnosis present

## 2021-10-02 DIAGNOSIS — E440 Moderate protein-calorie malnutrition: Secondary | ICD-10-CM | POA: Diagnosis not present

## 2021-10-02 DIAGNOSIS — E43 Unspecified severe protein-calorie malnutrition: Secondary | ICD-10-CM | POA: Diagnosis present

## 2021-10-02 DIAGNOSIS — Z4659 Encounter for fitting and adjustment of other gastrointestinal appliance and device: Secondary | ICD-10-CM

## 2021-10-02 DIAGNOSIS — Z20822 Contact with and (suspected) exposure to covid-19: Secondary | ICD-10-CM | POA: Diagnosis present

## 2021-10-02 DIAGNOSIS — F061 Catatonic disorder due to known physiological condition: Secondary | ICD-10-CM | POA: Diagnosis present

## 2021-10-02 DIAGNOSIS — I472 Ventricular tachycardia, unspecified: Secondary | ICD-10-CM | POA: Diagnosis not present

## 2021-10-02 DIAGNOSIS — R4182 Altered mental status, unspecified: Secondary | ICD-10-CM | POA: Diagnosis not present

## 2021-10-02 DIAGNOSIS — E876 Hypokalemia: Secondary | ICD-10-CM | POA: Diagnosis present

## 2021-10-02 DIAGNOSIS — F200 Paranoid schizophrenia: Secondary | ICD-10-CM | POA: Diagnosis present

## 2021-10-02 DIAGNOSIS — Z681 Body mass index (BMI) 19 or less, adult: Secondary | ICD-10-CM

## 2021-10-02 DIAGNOSIS — I959 Hypotension, unspecified: Secondary | ICD-10-CM

## 2021-10-02 DIAGNOSIS — D649 Anemia, unspecified: Secondary | ICD-10-CM

## 2021-10-02 DIAGNOSIS — R630 Anorexia: Secondary | ICD-10-CM | POA: Diagnosis not present

## 2021-10-02 DIAGNOSIS — Z9114 Patient's other noncompliance with medication regimen: Secondary | ICD-10-CM

## 2021-10-02 DIAGNOSIS — F209 Schizophrenia, unspecified: Secondary | ICD-10-CM | POA: Diagnosis not present

## 2021-10-02 DIAGNOSIS — E878 Other disorders of electrolyte and fluid balance, not elsewhere classified: Secondary | ICD-10-CM | POA: Diagnosis present

## 2021-10-02 DIAGNOSIS — E871 Hypo-osmolality and hyponatremia: Secondary | ICD-10-CM | POA: Diagnosis present

## 2021-10-02 DIAGNOSIS — R410 Disorientation, unspecified: Secondary | ICD-10-CM

## 2021-10-02 DIAGNOSIS — R9431 Abnormal electrocardiogram [ECG] [EKG]: Secondary | ICD-10-CM | POA: Diagnosis not present

## 2021-10-02 HISTORY — DX: Anemia, unspecified: D64.9

## 2021-10-02 LAB — CBC
HCT: 36.4 % (ref 36.0–46.0)
Hemoglobin: 11.7 g/dL — ABNORMAL LOW (ref 12.0–15.0)
MCH: 27.7 pg (ref 26.0–34.0)
MCHC: 32.1 g/dL (ref 30.0–36.0)
MCV: 86.3 fL (ref 80.0–100.0)
Platelets: 288 10*3/uL (ref 150–400)
RBC: 4.22 MIL/uL (ref 3.87–5.11)
RDW: 12.5 % (ref 11.5–15.5)
WBC: 6.9 10*3/uL (ref 4.0–10.5)
nRBC: 0 % (ref 0.0–0.2)

## 2021-10-02 LAB — RETICULOCYTES
Immature Retic Fract: 10 % (ref 2.3–15.9)
RBC.: 4.13 MIL/uL (ref 3.87–5.11)
Retic Count, Absolute: 44.2 10*3/uL (ref 19.0–186.0)
Retic Ct Pct: 1.1 % (ref 0.4–3.1)

## 2021-10-02 LAB — GLUCOSE, CAPILLARY: Glucose-Capillary: 121 mg/dL — ABNORMAL HIGH (ref 70–99)

## 2021-10-02 LAB — CREATININE, SERUM
Creatinine, Ser: 0.65 mg/dL (ref 0.44–1.00)
GFR, Estimated: >60 mL/min (ref >60)

## 2021-10-02 MED ORDER — BENZTROPINE MESYLATE 1 MG/ML IJ SOLN: 0.5000 mg | Freq: Two times a day (BID) | INTRAMUSCULAR | Status: DC | PRN

## 2021-10-02 MED ORDER — ADULT MULTIVITAMIN W/MINERALS CH
1.0000 | ORAL_TABLET | Freq: Every day | ORAL | Status: DC
  Filled 2021-10-02: qty 1

## 2021-10-02 MED ORDER — OSMOLITE 1.2 CAL PO LIQD: 1000.0000 mL | ORAL | 0 refills | Status: DC

## 2021-10-02 MED ORDER — OSMOLITE 1.2 CAL PO LIQD
1000.0000 mL | ORAL | Status: DC
  Administered 2021-10-02: 1000 mL

## 2021-10-02 MED ORDER — FREE WATER: 120.0000 mL | Freq: Four times a day (QID) | Status: DC

## 2021-10-02 MED ORDER — FREE WATER
120.0000 mL | Freq: Four times a day (QID) | Status: DC
  Administered 2021-10-02 – 2021-10-06 (×13): 120 mL

## 2021-10-02 MED ORDER — ACETAMINOPHEN 325 MG PO TABS
650.0000 mg | ORAL_TABLET | Freq: Four times a day (QID) | ORAL | Status: DC | PRN
  Filled 2021-10-02: qty 2

## 2021-10-02 MED ORDER — LORAZEPAM 2 MG/ML IJ SOLN: 2.0000 mg | Freq: Four times a day (QID) | INTRAMUSCULAR | 0 refills | Status: DC | PRN

## 2021-10-02 MED ORDER — HEPARIN SODIUM (PORCINE) 5000 UNIT/ML IJ SOLN
5000.0000 [IU] | Freq: Three times a day (TID) | INTRAMUSCULAR | Status: AC
  Administered 2021-10-02 – 2021-10-09 (×20): 5000 [IU] via SUBCUTANEOUS
  Filled 2021-10-02 (×20): qty 1

## 2021-10-02 MED ORDER — THIAMINE HCL 100 MG/ML IJ SOLN
100.0000 mg | Freq: Every day | INTRAMUSCULAR | Status: DC
  Administered 2021-10-02 – 2021-10-09 (×8): 100 mg via INTRAVENOUS
  Filled 2021-10-02 (×8): qty 2

## 2021-10-02 MED ORDER — OSMOLITE 1.2 CAL PO LIQD
1000.0000 mL | ORAL | Status: AC
  Administered 2021-10-02 – 2021-10-05 (×4): 1000 mL

## 2021-10-02 MED ORDER — BENZTROPINE MESYLATE 0.5 MG PO TABS: 0.5000 mg | ORAL_TABLET | Freq: Two times a day (BID) | ORAL | Status: DC | PRN

## 2021-10-02 MED ORDER — ACETAMINOPHEN 650 MG RE SUPP: 650.0000 mg | Freq: Four times a day (QID) | RECTAL | Status: DC | PRN

## 2021-10-02 MED ORDER — MIRTAZAPINE 15 MG PO TBDP: 7.5000 mg | ORAL_TABLET | Freq: Every day | ORAL | 0 refills | Status: DC

## 2021-10-02 MED ORDER — CLONAZEPAM 0.5 MG PO TABS: 0.5000 mg | ORAL_TABLET | Freq: Two times a day (BID) | ORAL | 0 refills | Status: DC

## 2021-10-02 MED ORDER — SODIUM CHLORIDE 0.9 % IV SOLN: INTRAVENOUS | Status: AC

## 2021-10-02 MED ORDER — ADULT MULTIVITAMIN W/MINERALS CH
1.0000 | ORAL_TABLET | Freq: Every day | ORAL | Status: DC
  Administered 2021-10-03 – 2021-10-16 (×13): 1
  Filled 2021-10-02 (×13): qty 1

## 2021-10-02 MED ORDER — GLUCAGON HCL RDNA (DIAGNOSTIC) 1 MG IJ SOLR: 0.5000 mg | Freq: Once | INTRAMUSCULAR | 0 refills | Status: DC

## 2021-10-02 NOTE — Assessment & Plan Note (Deleted)
Pt is altered and cooperative. ?Says the food at [facility] long was not good and that is why she is here. ?Varied d/d therefore we will obtain  RPR, ANA, thyroid function test, sed rate, B12. ?We will consider neurology consult , and consider MRI brain.  ?

## 2021-10-02 NOTE — Progress Notes (Signed)
Chaplain made follow up visit with Laurna this afternoon.  She wasd quiet and low energy and spoke softly, describing how she felt lonely and wanted to go home.  When Chaplain asked if she would like visitors she said "no" emphatically.  But she thanks the Chaplain for the visit.  Chaplain provided her an opportunity to share her emotions and thoughts , no spiritual distress was exhibited at this time.   ?Chaplain ended visit with a departing blessing for continued rest and healing.   ? ? 10/02/21 1500  ?Clinical Encounter Type  ?Visited With Patient  ?Visit Type Follow-up  ?Referral From Chaplain  ?Consult/Referral To Chaplain  ?Spiritual Encounters  ?Spiritual Needs Emotional  ?Stress Factors  ?Patient Stress Factors Family relationships  ?Family Stress Factors None identified  ? ? ?

## 2021-10-02 NOTE — Assessment & Plan Note (Deleted)
Anemia panel. ?And will consider [facility] eval to identify any malignant cause of weight loss in addition to decreased po intake. Pt has gastritis on egd. ? ?

## 2021-10-02 NOTE — H&P (Addendum)
?History and Physical  ? ? ?Patient: [name] AYT:[id number] DOB: May 19, 1961 ?DOA: 10/02/2021 ?DOS: the patient was seen and examined on 10/02/2021 ?PCP: [facility]  ?Patient coming from:  [place]. ? ?Chief Complaint: No chief complaint on file. ? ?HPI: [name] is a 61 y.o. female with medical history significant of paranoid schizophrenia presented to the emergency room on 09/12/21 brought by family for not 8 drinking and eating and becoming hypoglycemic.  Patient is not taking her meds and is having hallucinations and delusional thoughts. ?Patient was evaluated psychiatrist and found to lack capacity to make decisions and is incompetent.  Patient was then transferred to to [facility] for ECT therapy.  Patient refused to eat and drink and NG tube was placed via fluoroscopy . ?Chart and care everywhere reviewed pt has had schizopyrenida and no other significant history  for as back as 2018 and last echo was 2021 showing: ?1. Left ventricular ejection fraction, by estimation, is 60 to 65%. The left ventricle has normal ?function. The left ventricle has no regional wall motion abnormalities. Left ventricular ?diastolic parameters were normal. ?2. Right ventricular systolic function is normal. The right ventricular size is normal. There is ?normal pulmonary artery systolic pressure. ?3. Right atrial size was mildly dilated. ?4. The mitral valve is normal in structure. Trivial mitral valve regurgitation. No evidence of ?mitral stenosis. ?5. The aortic valve is normal in structure. Aortic valve regurgitation is not visualized. No aortic ?stenosis is present. ?6. The inferior vena cava is normal in size with greater than 50% respiratory variability, ?suggesting right atrial pressure of 3 mmHg. ? ? Review of Systems  ?Unable to perform ROS: Psychiatric disorder  ? ?Past Medical History:  ?Diagnosis Date  ? Non compliance w medication regimen   ? Schizophrenia (HCC)   ? ?Past Surgical History:  ?Procedure  Laterality Date  ? BIOPSY  09/25/2021  ? Procedure: BIOPSY;  Surgeon: Rachael Fee, MD;  Location: [facility];  Service: Gastroenterology;;  ? ESOPHAGOGASTRODUODENOSCOPY (EGD) WITH PROPOFOL [name] 09/25/2021  ? Procedure: ESOPHAGOGASTRODUODENOSCOPY (EGD) WITH PROPOFOL;  Surgeon: Rachael Fee, MD;  Location: [facility];  Service: Gastroenterology;  Laterality: [name];  With NGT placement  ? ?Social History:  reports that she has been smoking cigarettes. She has been smoking an average of .5 packs per day. She has never used smokeless tobacco. She reports that she does not currently use alcohol. She reports that she does not use drugs. ? ?Allergies  ?Allergen Reactions  ? Aspirin Hives and Other (See Comments)  ?  Possibly caused hives, per sister  ? Ziprasidone Hcl Rash and Other (See Comments)  ?  Caused twitching  ? ? ?No family history on file. ? ?Prior to Admission medications   ?Medication Sig Start Date End Date Taking? Authorizing Provider  ?benztropine (COGENTIN) 0.5 MG tablet Take 1 tablet (0.5 mg total) by mouth 2 (two) times daily as needed for tremors. 10/02/21  Yes Rhetta Mura, MD  ?benztropine mesylate (COGENTIN) 1 MG/ML injection Inject 0.5 mLs (0.5 mg total) into the muscle 2 (two) times daily as needed for tremors. 10/02/21  Yes Rhetta Mura, MD  ?clonazePAM (KLONOPIN) 0.5 MG tablet Place 1 tablet (0.5 mg total) into feeding tube 2 (two) times daily. 10/02/21  Yes Rhetta Mura, MD  ?mirtazapine (REMERON SOL-TAB) 15 MG disintegrating tablet Place 0.5 tablets (7.5 mg total) into feeding tube at bedtime. 10/02/21  Yes Rhetta Mura, MD  ?glucagon, human recombinant, (GLUCAGEN) 1 MG injection Inject 0.5 mg into  the muscle once for 1 dose. 10/02/21 10/02/21  Rhetta MuraSamtani, Jai-Gurmukh, MD  ?LORazepam (ATIVAN) 2 MG/ML injection Inject 1 mL (2 mg total) into the muscle every 6 (six) hours as needed (agitation). 10/02/21   Rhetta MuraSamtani, Jai-Gurmukh, MD  ?Nutritional Supplements (FEEDING  SUPPLEMENT, OSMOLITE 1.2 CAL,) LIQD Place 1,000 mLs into feeding tube continuous. 10/02/21   Rhetta MuraSamtani, Jai-Gurmukh, MD  ?traZODone (DESYREL) 50 MG tablet Take 1 tablet (50 mg total) by mouth at bedtime as needed for sleep. ?Patient not taking: Reported on 09/13/2021 10/09/20   [name], Agnes I, NP  ?Water For Irrigation, Sterile (FREE WATER) SOLN Place 120 mLs into feeding tube every 6 (six) hours. 10/02/21   Rhetta MuraSamtani, Jai-Gurmukh, MD  ? ?Physical Exam: ?There were no vitals filed for this visit. ?Physical Exam ?Vitals and nursing note reviewed.  ?Constitutional:   ?   General: She is not in acute distress. ?   Appearance: Normal appearance. She is cachectic. She is not ill-appearing, toxic-appearing or diaphoretic.  ?HENT:  ?   Head: Normocephalic and atraumatic.  ?   Right Ear: Hearing and external ear normal.  ?   Left Ear: Hearing and external ear normal.  ?   Nose: Nose normal. No nasal deformity.  ?   Mouth/Throat:  ?   Lips: Pink.  ?   Mouth: Mucous membranes are moist.  ?   Tongue: No lesions.  ?   Pharynx: Oropharynx is clear.  ?Eyes:  ?   Extraocular Movements: Extraocular movements intact.  ?   Pupils: Pupils are equal, round, and reactive to light.  ?Neck:  ?   Vascular: No carotid bruit.  ?Cardiovascular:  ?   Rate and Rhythm: Normal rate and regular rhythm.  ?   Pulses: Normal pulses.  ?   Heart sounds: Normal heart sounds.  ?Pulmonary:  ?   Effort: Pulmonary effort is normal.  ?   Breath sounds: Normal breath sounds.  ?Abdominal:  ?   General: Bowel sounds are normal. There is no distension.  ?   Palpations: Abdomen is soft. There is no mass.  ?   Tenderness: There is no abdominal tenderness. There is no guarding.  ?   Hernia: No hernia is present.  ?Musculoskeletal:  ?   Right lower leg: No edema.  ?   Left lower leg: No edema.  ?Skin: ?   General: Skin is warm.  ?Neurological:  ?   General: No focal deficit present.  ?   Mental Status: She is alert and oriented to person, place, and time.  ?   Cranial  Nerves: Cranial nerves 2-12 are intact.  ?   Motor: Motor function is intact.  ?Psychiatric:     ?   Attention and Perception: Attention normal.     ?   Mood and Affect: Mood normal.     ?   Speech: Speech normal.     ?   Behavior: Behavior normal. Behavior is cooperative.     ?   Cognition and Memory: Cognition normal.  ? ? ?Data Reviewed: ?Results for orders placed or performed during the hospital encounter of 10/02/21 (from the past 24 hour(s))  ?CBC     Status: Abnormal  ? Collection Time: 10/02/21  8:36 PM  ?Result Value Ref Range  ? WBC 6.9 4.0 - 10.5 K/uL  ? RBC 4.22 3.87 - 5.11 MIL/uL  ? Hemoglobin 11.7 (L) 12.0 - 15.0 g/dL  ? HCT 36.4 36.0 - 46.0 %  ? MCV 86.3 80.0 -  100.0 fL  ? MCH 27.7 26.0 - 34.0 pg  ? MCHC 32.1 30.0 - 36.0 g/dL  ? RDW 12.5 11.5 - 15.5 %  ? Platelets 288 150 - 400 K/uL  ? nRBC 0.0 0.0 - 0.2 %  ?Creatinine, serum     Status: None  ? Collection Time: 10/02/21  8:36 PM  ?Result Value Ref Range  ? Creatinine, Ser 0.65 0.44 - 1.00 mg/dL  ? GFR, Estimated >60 >60 mL/min  ? ?Assessment and Plan: ?* AMS (altered mental status) ?Pt is altered and cooperative. ?Says the food at [facility] long was not good and that is why she is here. ?Varied d/d therefore we will obtain  RPR, ANA, thyroid function test, sed rate, B12. ?We will consider neurology consult , and consider MRI brain.  ? ?Schizophrenia, paranoid (HCC) ?Will resume home meds once med reconciliation is available. ?Psychiatry on-call consult order will be placed. ? ?Hypokalemia, hypomagnesemia and hypophosphatemia ?We will check and replace.  ? ? ?Protein-calorie malnutrition, severe (HCC) ?We will cont NGT feeds.  ?Pt has hypoalbuminemia and severe protein calorie malnutrition.  ? ? ? ?Anemia ?Anemia panel. ?And will consider LGI eval to identify any malignant cause of weight loss in addition to decreased po intake. Pt has gastritis on egd. ? ? ? ? Advance Care Planning:  ?  Code Status: Full Code  ? ?Consults:  ?None  ? ?Family  Communication:  ?Hulsebus-Lennon,[name] (Legal Guardian)  ?[phone] (Mobile) ? ?Severity of Illness: ?The appropriate patient status for this patient is OBSERVATION. Observation status is judged to be reasonable and necessary

## 2021-10-02 NOTE — Assessment & Plan Note (Addendum)
First ECT treatment on 10/08/2021.  Currently receiving ECT 3 times a week.  Had a persistent ventricular tachycardia 3/27 during the session of ECT.  Pending further decision about additional treatment. ?

## 2021-10-02 NOTE — Assessment & Plan Note (Addendum)
Recheck BMP tomorrow. ? ?

## 2021-10-02 NOTE — Discharge Summary (Signed)
?Physician Discharge Summary ?  ?Patient: [name] MRN: [id number] DOB: 1960-11-04  ?Admit date:     09/12/2021  ?Discharge date: 10/02/21  ?Discharge Physician: Rhetta Mura  ? ?PCP: [facility]  ? ?Recommendations at discharge:  ? ?Patient transferring to [facility] for electroconvulsive therapy secondary to intractable paranoid schizophrenia nonresponsive to medication ?Recommend Chem-12 CBC magnesium periodically ?Recommend nutritionist consult for management of tube feeds and initiation of enteric feeds ?Point of contact for patient care discussions ideally is [name] patient's sister and patient's mother ? ?Discharge Diagnoses: ?Principal Problem: ?  Recurrent hypoglycemia and severe malnutrition ?Active Problems: ?  Protein-calorie malnutrition, severe (HCC) ?  Schizophrenia, paranoid (HCC) ?  Hypokalemia, hypomagnesemia and hypophosphatemia ?  Metabolic acidosis ?  Hyponatremia ?  Hypophosphatemia ?  Hypomagnesemia ?  Hypokalemia ? ?Resolved Problems: ?  Schizophrenia (HCC) ? ?Hospital Course: ?61 year old F with PMH of paranoid schizophrenia who presented to ED on 09/12/2021 under IVC from family due to not taking her meds, auditory hallucination and delusional thoughts.   became hypoglycemic due to refusal to eat or drink, and admitted for hypoglycemia.  started on D5 infusion but removing IV lines and IV replacement and oral intake.   ?  ?evaluated by psychiatry-- lacks capacity.  Legal guardian, sister consenting for treatment and interventions.   ?started on Haldol without significant improvement in her paranoia.   ?Continued to refuse IV placement and oral intake.   ? decision was made for nasogastric feeding tube and soft restraints on 09/19/2021 after discussion with legal guardian. Unfortunately, ICU RN not able to advance small bore NGT.   ?General surgery, [facility] and IR recommended consulted radiology for fluoroscopy guided NG tube placement by radiology.    ?Radiology deferred NG tube placement on this patient with restraints, and recommended placement under sedation or general anesthesia.   ?Hospital CMO and ethics involved.  ?[facility] consulted. ?Post EGD and NG tube placement on 09/25/21 by Dr. Christella Hartigan.   ? ?Patient's medical POA who is also her sister has declined ECT initially-we had a detailed conference conversation 10/02/2021 between attending physician, psychiatrist and 3 family members including the patient's sister ?We explained the risks benefits and alternatives to electroconvulsive therapy-explained that patient is nonresponsiveness to haloperidol portends poorly in terms of medication management solely, patient's family is accepting of a trial of ECT after detailed explanation about the risks of anesthesia etc. etc. ? ?Patient will be transported via [facility] to [facility] hospital under the care of the hospitalist service with medical comanagement occurring with Dr. Toni Amend of psychiatry ? ?Assessment and Plan: ?Resolved hypoglycemia ?Severe malnutrition with BMI of 16 status post EGD and NG tube placement on 09/25/2021 by GI. ?Appreciate GIs, Dr. Christella Hartigan, assistance with placement of NG tube.   ?EGD revealed minimal erythema in distal stomach, biopsied to check for H. pylori  Which was negative ?Tube feeding initiated on 09/25/2021, continue tube feeds at this time please consult nutritionist at [facility] to help further tailor the same ?Continue soft restraint for patient's own safety and patient will need a safety sitter in addition ?Aspiration precautions ?Continue Remeron and thiamine supplement ?  ?Schizophrenia, paranoid (HCC) ? IVC renewed on 3/3.  needs inpatient psychiatric hospitalization by psychiatry. ?-Had long-acting Haldol injection on 3/3.  She is also on IM/p.o. Haldol ?-Current meds as per psychiatry on discharge include ?Trazodone 50 at bedtime tube sleep ?Remeron 7.5 at bedtime ?Ativan 2 mg every 6 as needed agitation, clonazepam 0.5 twice daily  scheduled ?  Cogentin 0.5 twice daily as needed tremors/0.5 IM twice daily as needed tremors ?-ECT will be coordinated with Dr. Toni Amend at [facility] ?- EKG prior to discharge will be performed and interpreted ?  ?Resolved transient bradycardia ?Unclear etiology ?Seen on 12 EKG done 09/24/2021 ?TSH normal 1.073 on 09/26/2021. ?Avoid AV nodal blocker agents ?Continue to monitor heart rate. ?  ?Resolved metabolic acidosis ?Resolved.  DC'd IV fluids on 09/25/2021. ?  ?Resolved post repletion: Hypokalemia, hypomagnesemia and hypophosphatemia ?Last labs on 3/16 = potassium 4.1, magnesium 2.4, phosphorus 4.1. ?  ?Resolved post IV fluid hydration: Hypovolemic hyponatremia ?Completed IV fluid hydration on 09/25/2021 after placement of NG tube. ?  ?Constipation ?Bowel regimen added Senokot 2 tablets twice daily. ? ? ? ?  ? ? ?Consultants: Psychiatry ?Procedures performed: EGD, NGT placement ?Disposition:  Patient transferring to [facility] ?Diet recommendation:  ?Discharge Diet Orders (From admission, onward)  ? ?  Start     Ordered  ? 10/02/21 0000  Diet - low sodium heart healthy       ? 10/02/21 1534  ? ?  ?  ? ?  ? ?Regular diet ?DISCHARGE MEDICATION: ?Allergies as of 10/02/2021   ? ?   Reactions  ? Aspirin Hives, Other (See Comments)  ? Possibly caused hives, per sister  ? Ziprasidone Hcl Rash, Other (See Comments)  ? Caused twitching  ? ?  ? ?  ?Medication List  ?  ? ?STOP taking these medications   ? ?haloperidol 5 MG tablet ?Commonly known as: HALDOL ?  ?megestrol 20 MG tablet ?Commonly known as: MEGACE ?  ? ?  ? ?TAKE these medications   ? ?benztropine 0.5 MG tablet ?Commonly known as: COGENTIN ?Take 1 tablet (0.5 mg total) by mouth 2 (two) times daily as needed for tremors. ?What changed:  ?when to take this ?reasons to take this ?additional instructions ?  ?benztropine mesylate 1 MG/ML injection ?Commonly known as: COGENTIN ?Inject 0.5 mLs (0.5 mg total) into the muscle 2 (two) times daily as needed for tremors. ?What changed: You were  already taking a medication with the same name, and this prescription was added. Make sure you understand how and when to take each. ?  ?clonazePAM 0.5 MG tablet ?Commonly known as: KLONOPIN ?Place 1 tablet (0.5 mg total) into feeding tube 2 (two) times daily. ?  ?feeding supplement (OSMOLITE 1.2 CAL) Liqd ?Place 1,000 mLs into feeding tube continuous. ?  ?free water Soln ?Place 120 mLs into feeding tube every 6 (six) hours. ?  ?glucagon (human recombinant) 1 MG injection ?Commonly known as: GLUCAGEN ?Inject 0.5 mg into the muscle once for 1 dose. ?  ?LORazepam 2 MG/ML injection ?Commonly known as: ATIVAN ?Inject 1 mL (2 mg total) into the muscle every 6 (six) hours as needed (agitation). ?  ?mirtazapine 15 MG disintegrating tablet ?Commonly known as: REMERON SOL-TAB ?Place 0.5 tablets (7.5 mg total) into feeding tube at bedtime. ?  ?traZODone 50 MG tablet ?Commonly known as: DESYREL ?Take 1 tablet (50 mg total) by mouth at bedtime as needed for sleep. ?  ? ?  ? ? ?Discharge Exam: ?Filed Weights  ? 09/20/21 0500 09/25/21 8338 09/28/21 0411  ?Weight: 42 kg 42 kg 43.3 kg  ? ?Awake coherent no distress EOMI NCAT very dry mucosa ?No icterus ?Quite withdrawn-monosyllabic ?S1-S2 no murmur ?Abdomen soft ?NG tube in place in left nare ?Lower extremity soft nontender ?Skin integrity on sacrum not examined-patient has been ambulating ? ?Condition at discharge: fair ? ?The results of  significant diagnostics from this hospitalization (including imaging, microbiology, ancillary and laboratory) are listed below for reference.  ? ?Imaging Studies: ?No results found. ? ?Microbiology: ?Results for orders placed or performed during the hospital encounter of 09/12/21  ?Resp Panel by RT-PCR (Flu A&B, Covid) Nasopharyngeal Swab     Status: None  ? Collection Time: 09/12/21  6:05 PM  ? Specimen: Nasopharyngeal Swab; Nasopharyngeal(NP) swabs in vial transport medium  ?Result Value Ref Range Status  ? SARS Coronavirus 2 by RT PCR NEGATIVE  NEGATIVE Final  ?  Comment: (NOTE) ?SARS-CoV-2 target nucleic acids are NOT DETECTED. ? ?The SARS-CoV-2 RNA is generally detectable in upper respiratory ?specimens during the acute phase of infection. The

## 2021-10-02 NOTE — Ethics Note (Signed)
Ethics Consult Note ? ?Initial contact w/ medical team 10/02/21, which was on patient's hospital day 16  ? ?Source of Consult: Consuela Mimes FNP (Psychiatry service) ?Current attending physician/service: Rhetta Mura, MD ? ?Reason(s) for consult / relevant ethical question(s): ?Disagreement between treatment team and legal guardian on appropriate treatment of psychiatric condition ?Question about beneficence vs harm of patient discharge  ? ? ?Information-gathering: ?Discussion with source of consult ?Chart review 10/02/21  ? ? ?Narrative: Patient with paranoia, she is not eating and refusing to eat until she goes home. She is refusing blood draws. THere is concern that a potentially more effective medication may be unsafe if not able to monitor blood draws and not able to ensure compliance/adherence to medication. She does not have decision-making capacity, treatment team has been in contact with patient's sister who is her legal guardian. This sister has declined ECT treatment, requesting give medications more time. Psychiatry team has concerns that the patient is not responding to medications as she has in the past, they are concerned about her physical health if she continues to refuse food and for that reason are reluctant to discharge her home since she has not shown any po intake in the hospital  ? ?Relevant underlying ethical principles were discussed directly with Caryn Bee. Other resources were discussed directly with the that person, if needed. Ethics committee strives to ensure that all necessary and appropriate steps are taken by the medical team such that all decisions made for this patient are ethically appropriate. Please note that Ethics does NOT offer legal counsel. Ethics offers the following recommendations:  ? ? ? ?Recommendations: ? ?Regarding surrogate decision maker's requests/refusals of treatment: there is every reason to believe that the legal decision-maker is acting  in accordance with what she judges to be the patient's best interest, although the treatment team is concerned that medications have not shown to be effective and have recommended ECT to help correct the patient's psychosis etc. At this time, there is not an ethical imperative to override this decision and to go ahead with ECT (as opposed overriding or acting without consult of legal guardian, which is often ethically appropriate cases in which urgent life-saving treatment is needed such as emergency surgery, cases in which the legal guardian is suspected to be acting in ways other than in best interest of the patient, cases in which the legal guardian is unavailable or obviously impaired, etc). If the treatment team wants to insist on overriding the sister's wishes and proceeding with ECT, there would likely need to be a court order, and they have the option to pursue this if desired.  ?Regarding discharge home: themes of beneficence vs nonmaleficence are at play here. There is desire to do good and to avoid harm - the question becomes: will the patient be safer/happier at home and will she eat at home, versus will the patient potentially be in danger of further decline if sent home per her request? If the treatment team 1) judges the benefit to outweigh the risk, and 2) can arrange appropriate outpatient follow-up, and 3) can be assured from the patient's legal guardian (sister) that she is willing and able to adequately supervise the patient's care, then it may be reasonable and ethically appropriate to discharge the patient.  ? ? ? ? ? ? ? ?Thank you for this consult. Ethics will continue to follow this case.  ? ?Caryn Bee FNP and Pleas Koch MD have my personal cell phone number and have my permission  to share this number at their discretion.  ?Secure message on Epic is also welcome but may not receive an immediate response.  ?Please reference AMION for on-call committee member if needed.   ? ? ?Sunnie Nielsen ?[facility] Ethics Committee  ? ?

## 2021-10-02 NOTE — Progress Notes (Signed)
Report called to [facility] per this nurse at [facility] ?

## 2021-10-02 NOTE — Consult Note (Signed)
[facility] Face to Face Consultation  ? ?Reason for Consult: Worsening psychosis,.  ?Referring Physician: Dr. Margo AyeHall ? ? ?Patient Identification: [name] ?MRN:  [id number] ?Principal Diagnosis: Hypoglycemia ?Diagnosis:  Principal Problem: ?  Recurrent hypoglycemia and severe malnutrition ?Active Problems: ?  Schizophrenia, paranoid (HCC) ?  Hypokalemia, hypomagnesemia and hypophosphatemia ?  Protein-calorie malnutrition, severe (HCC) ?  Metabolic acidosis ?  Hyponatremia ?  Hypophosphatemia ?  Hypomagnesemia ?  Hypokalemia ? ?Total Time Spent in Direct Patient Care: 30 ? ?Subjective: "I am ok." ? ?HPI:  ?[name] is a 61 y.o. female patient, was brought to the ED under IVC due to worsening psychosis in the wake of medication non-compliance. Patient has hx of Schizophrenia (chronic). Is known at the psychiatry from her previous admission for mood stabilizations treatments.  ? ?No changes at this time. Patient is observed to be sitting up in bed, being compliant with blood glucose.  She makes minimal eye contact with this provider. She presents withdrawn and reclusive, with minimal to no interaction with staff and providers. She has been compliant with her NG tube, and does not appear to be tampering with it. She denies eating or having any oral intake at this time.This nurse practitioner offered her oral care and or food or drink of her choice, in which she declined. She is advised of family meeting to discuss her request of going home, she simply says " ok." But forwards no enthusiasm or emotions regarding this gesture. She denies SI, HI, AVH.  Today, she does not make any paranoid comments.   ? ?Spoke with legal guardian Marlin CanaryOrisis Neece Lennon, who has agreed to a family session today at 2:30 PM with brother, mother, and psychiatry consult liaison team.  We have extended invite to [facility] hospitalist at this time.  We will also touch basis with ethics. ? ?Past Psychiatric History: Schizophrenia. ? ?Risk to Self: Yes, has  been refusing p.o. is now receiving feedings via NG tube. ?Risk to Others: verbally aggressive. ?Prior Inpatient Therapy: [facility] x multiple times. ?Prior Outpatient Therapy: Yes. ? ?Past Medical History:  ?Past Medical History:  ?Diagnosis Date  ? Non compliance w medication regimen   ? Schizophrenia (HCC)   ?  ?Past Surgical History:  ?Procedure Laterality Date  ? BIOPSY  09/25/2021  ? Procedure: BIOPSY;  Surgeon: Rachael FeeJacobs, Daniel P, MD;  [facility];  Service: Gastroenterology;;  ? ESOPHAGOGASTRODUODENOSCOPY (EGD) WITH PROPOFOL [name] 09/25/2021  ? Procedure: ESOPHAGOGASTRODUODENOSCOPY (EGD) WITH PROPOFOL;  Surgeon: Rachael FeeJacobs, Daniel P, MD;  Location: [facility];  Service: Gastroenterology;  Laterality: [name];  With NGT placement  ? ? ?Family History: History reviewed. No pertinent family history. ? ?Family Psychiatric  History: Refuses to answer. ? ? ?Social History: Single. ?Social History  ? ?Substance and Sexual Activity  ?Alcohol Use Not Currently  ? Comment: Refuses to disclose how much  ?   ?Social History  ? ?Substance and Sexual Activity  ?Drug Use No  ? Comment: Refuses to answer  ?  ?Social History  ? ?Socioeconomic History  ? Marital status: Single  ?  Spouse name: Not on file  ? Number of children: Not on file  ? Years of education: Not on file  ? Highest education level: Not on file  ?Occupational History  ? Not on file  ?Tobacco Use  ? Smoking status: Some Days  ?  Packs/day: 0.50  ?  Types: Cigarettes  ? Smokeless tobacco: Never  ?Vaping Use  ? Vaping Use: Every day  ?Substance  and Sexual Activity  ? Alcohol use: Not Currently  ?  Comment: Refuses to disclose how much  ? Drug use: No  ?  Comment: Refuses to answer  ? Sexual activity: Never  ?  Comment: refused to answer  ?Other Topics Concern  ? Not on file  ?Social History Narrative  ? Not on file  ? ?Social Determinants of Health  ? ?Financial Resource Strain: Not on file  ?Food Insecurity: Not on file  ?Transportation Needs: Not on file  ?Physical  Activity: Not on file  ?Stress: Not on file  ?Social Connections: Not on file  ? ?Additional Social History:  Lives alone ? ?Allergies:   ?Allergies  ?Allergen Reactions  ? Aspirin Hives and Other (See Comments)  ?  Possibly caused hives, per sister  ? Ziprasidone Hcl Rash and Other (See Comments)  ?  Caused twitching  ? ?Labs:  ?Results for orders placed or performed during the hospital encounter of 09/12/21 (from the past 48 hour(s))  ?Glucose, capillary     Status: Abnormal  ? Collection Time: 09/30/21  4:22 PM  ?Result Value Ref Range  ? Glucose-Capillary 120 (H) 70 - 99 mg/dL  ?  Comment: Glucose reference range applies only to samples taken after fasting for at least 8 hours.  ?Glucose, capillary     Status: None  ? Collection Time: 09/30/21  7:20 PM  ?Result Value Ref Range  ? Glucose-Capillary 96 70 - 99 mg/dL  ?  Comment: Glucose reference range applies only to samples taken after fasting for at least 8 hours.  ? Comment 1 Notify RN   ? Comment 2 Document in Chart   ?Glucose, capillary     Status: Abnormal  ? Collection Time: 09/30/21 11:56 PM  ?Result Value Ref Range  ? Glucose-Capillary 108 (H) 70 - 99 mg/dL  ?  Comment: Glucose reference range applies only to samples taken after fasting for at least 8 hours.  ? Comment 1 Notify RN   ? Comment 2 Document in Chart   ?Glucose, capillary     Status: Abnormal  ? Collection Time: 10/01/21  4:06 AM  ?Result Value Ref Range  ? Glucose-Capillary 102 (H) 70 - 99 mg/dL  ?  Comment: Glucose reference range applies only to samples taken after fasting for at least 8 hours.  ?Magnesium     Status: None  ? Collection Time: 10/01/21  5:16 AM  ?Result Value Ref Range  ? Magnesium 2.4 1.7 - 2.4 mg/dL  ?  Comment: Performed at [facility], [address] [place], [place] [address]  ?Phosphorus     Status: None  ? Collection Time: 10/01/21  5:16 AM  ?Result Value Ref Range  ? Phosphorus 4.1 2.5 - 4.6 mg/dL  ?  Comment: Performed at [facility], [address], [place], [place] [address]  ?CBC     Status: Abnormal  ? Collection Time: 10/01/21  5:16 AM  ?Result Value Ref Range  ? WBC 4.9 4.0 - 10.5 K/uL  ? RBC 4.13 3.87 - 5.11 MIL/uL  ? Hemoglobin 11.8 (L) 12.0 - 15.0 g/dL  ? HCT 36.1 36.0 - 46.0 %  ? MCV 87.4 80.0 - 100.0 fL  ? MCH 28.6 26.0 - 34.0 pg  ? MCHC 32.7 30.0 - 36.0 g/dL  ? RDW 12.5 11.5 - 15.5 %  ? Platelets 268 150 - 400 K/uL  ? nRBC 0.0 0.0 - 0.2 %  ?  Comment: Performed at  [facility], [address] [place], [place] [address]  ?Comprehensive metabolic panel     Status: Abnormal  ? Collection Time: 10/01/21  5:16 AM  ?Result Value Ref Range  ? Sodium 139 135 - 145 mmol/L  ? Potassium 4.1 3.5 - 5.1 mmol/L  ? Chloride 102 98 - 111 mmol/L  ? CO2 29 22 - 32 mmol/L  ? Glucose, Bld 127 (H) 70 - 99 mg/dL  ?  Comment: Glucose reference range applies only to samples taken after fasting for at least 8 hours.  ? BUN 15 6 - 20 mg/dL  ? Creatinine, Ser 0.57 0.44 - 1.00 mg/dL  ? Calcium 8.8 (L) 8.9 - 10.3 mg/dL  ? Total Protein 6.5 6.5 - 8.1 g/dL  ? Albumin 3.4 (L) 3.5 - 5.0 g/dL  ? AST 14 (L) 15 - 41 U/L  ? ALT 9 0 - 44 U/L  ? Alkaline Phosphatase 50 38 - 126 U/L  ? Total Bilirubin 0.1 (L) 0.3 - 1.2 mg/dL  ? GFR, Estimated >60 >60 mL/min  ?  Comment: (NOTE) ?Calculated using the CKD-EPI Creatinine Equation (2021) ?  ? Anion gap 8 5 - 15  ?  Comment: Performed at [facility], [address] [place], [place] [address]  ?Glucose, capillary     Status: Abnormal  ? Collection Time: 10/01/21  8:08 AM  ?Result Value Ref Range  ? Glucose-Capillary 123 (H) 70 - 99 mg/dL  ?  Comment: Glucose reference range applies only to samples taken after fasting for at least 8 hours.  ?Glucose, capillary     Status: Abnormal  ? Collection Time: 10/01/21  1:11 PM  ?Result Value Ref Range  ? Glucose-Capillary 100 (H) 70 - 99 mg/dL  ?  Comment: Glucose reference range applies only to samples taken after fasting for at least 8  hours.  ?Glucose, capillary     Status: Abnormal  ? Collection Time: 10/01/21  4:34 PM  ?Result Value Ref Range  ? Glucose-Capillary 109 (H) 70 - 99 mg/dL  ?  Comment: Glucose reference range applies onl

## 2021-10-02 NOTE — Progress Notes (Signed)
Nutrition Follow-up ? ?DOCUMENTATION CODES:  ? ?Underweight ? ?INTERVENTION:  ? ?-Continue Thiamine via tube ?-Check Mg and Phos levels in AM ?  ?-Advance Osmolite 1.2 to 60 ml/hr via NGT ?-Continue MVI via tube and free water of 120 ml QID (480 ml). ?-Goal rate is 70 ml/hr ?-Goal will provide 2016 kcals, 93g protein and 1857 ml H2O ? ?NUTRITION DIAGNOSIS:  ? ?Inadequate oral intake related to chronic illness (paranoid schizophrenia) as evidenced by meal completion < 25% (refusing PO intakes). ? ?Ongoing ? ?GOAL:  ? ?Patient will meet greater than or equal to 90% of their needs ? ?Progressing ? ?MONITOR:  ? ?PO intake, Supplement acceptance, Labs, Weight trends, I & O's ? ?ASSESSMENT:  ? ?61 y.o. female with medical history significant of schizophrenia who was brought into the ED on 2/24 with IVC from family indicating that she has not been taking her medications, hearing voices and having hallucination and delusional thoughts. ? ?Patient receiving Osmolite 1.2 @ 50 ml/hr. Mg/Phos levels were WNL 3/15. Will advance to 60 ml/hr today. Will check Mg and Phos levels in the AM. If WNL, can advance to goal rate of 70 ml/hr tomorrow.  ? ?Admission weight: 93 lbs ?3/12 weight: 95 lbs ? ?Medications: Remeron, Multivitamin with minerals daily, Senokot, Thiamine ? ?Labs reviewed: ? CBGs: 100-139 ? ?Diet Order:   ?Diet Order   ? ?       ?  Diet regular Room service appropriate? Yes; Fluid consistency: Thin  Diet effective now       ?  ? ?  ?  ? ?  ? ? ?EDUCATION NEEDS:  ? ?Not appropriate for education at this time ? ?Skin:  Skin Assessment: Reviewed RN Assessment ? ?Last BM:  3/5 ? ?Height:  ? ?Ht Readings from Last 1 Encounters:  ?09/25/21 5\' 4"  (1.626 m)  ? ? ?Weight:  ? ?Wt Readings from Last 1 Encounters:  ?09/28/21 43.3 kg  ? ? ?BMI:  Body mass index is 16.39 kg/m?. ? ?Estimated Nutritional Needs:  ? ?Kcal:  1900-2100 ? ?Protein:  100-115g ? ?Fluid:  2L/day ? ?11/28/21, MS, RD, LDN ?Inpatient Clinical  Dietitian ?Contact information available via Amion ? ?

## 2021-10-02 NOTE — Assessment & Plan Note (Addendum)
Patient has good appetite, continue protein supplements ?

## 2021-10-03 ENCOUNTER — Encounter: Payer: Self-pay | Admitting: Internal Medicine

## 2021-10-03 ENCOUNTER — Other Ambulatory Visit: Payer: Self-pay | Admitting: Psychiatry

## 2021-10-03 DIAGNOSIS — D638 Anemia in other chronic diseases classified elsewhere: Secondary | ICD-10-CM

## 2021-10-03 DIAGNOSIS — F200 Paranoid schizophrenia: Secondary | ICD-10-CM

## 2021-10-03 DIAGNOSIS — R001 Bradycardia, unspecified: Secondary | ICD-10-CM

## 2021-10-03 DIAGNOSIS — E878 Other disorders of electrolyte and fluid balance, not elsewhere classified: Secondary | ICD-10-CM

## 2021-10-03 LAB — GLUCOSE, CAPILLARY: Glucose-Capillary: 110 mg/dL — ABNORMAL HIGH (ref 70–99)

## 2021-10-03 LAB — IRON AND TIBC
Iron: 39 ug/dL (ref 28–170)
Saturation Ratios: 16 % (ref 10.4–31.8)
TIBC: 251 ug/dL (ref 250–450)
UIBC: 212 ug/dL

## 2021-10-03 LAB — MRSA NEXT GEN BY PCR, NASAL: MRSA by PCR Next Gen: NOT DETECTED

## 2021-10-03 LAB — HEMOGLOBIN A1C
Hgb A1c MFr Bld: 5.5 % (ref 4.8–5.6)
Mean Plasma Glucose: 111 mg/dL

## 2021-10-03 LAB — TSH: TSH: 3.861 u[IU]/mL (ref 0.350–4.500)

## 2021-10-03 LAB — RPR: RPR Ser Ql: NONREACTIVE

## 2021-10-03 LAB — C-REACTIVE PROTEIN: CRP: 1.1 mg/dL — ABNORMAL HIGH (ref <1.0)

## 2021-10-03 LAB — TYPE AND SCREEN
ABO/RH(D): O POS
Antibody Screen: NEGATIVE

## 2021-10-03 LAB — T4, FREE: Free T4: 0.87 ng/dL (ref 0.61–1.12)

## 2021-10-03 LAB — FERRITIN: Ferritin: 111 ng/mL (ref 11–307)

## 2021-10-03 LAB — VITAMIN B12: Vitamin B-12: 836 pg/mL (ref 180–914)

## 2021-10-03 LAB — CORTISOL-AM, BLOOD: Cortisol - AM: 11.7 ug/dL (ref 6.7–22.6)

## 2021-10-03 LAB — HIV ANTIBODY (ROUTINE TESTING W REFLEX): HIV Screen 4th Generation wRfx: NONREACTIVE

## 2021-10-03 LAB — FOLATE: Folate: 6.2 ng/mL (ref >5.9)

## 2021-10-03 MED ORDER — PROSOURCE TF PO LIQD
45.0000 mL | Freq: Every day | ORAL | Status: DC
  Administered 2021-10-03 – 2021-10-06 (×4): 45 mL
  Filled 2021-10-03 (×4): qty 45

## 2021-10-03 MED ORDER — CHLORHEXIDINE GLUCONATE 0.12 % MT SOLN
15.0000 mL | Freq: Two times a day (BID) | OROMUCOSAL | Status: DC
  Administered 2021-10-03 – 2021-10-12 (×20): 15 mL via OROMUCOSAL
  Filled 2021-10-03 (×15): qty 15

## 2021-10-03 MED ORDER — CLONAZEPAM 0.5 MG PO TABS: 0.5000 mg | ORAL_TABLET | Freq: Two times a day (BID) | ORAL | Status: DC

## 2021-10-03 MED ORDER — CLONAZEPAM 0.25 MG PO TBDP
0.2500 mg | ORAL_TABLET | Freq: Two times a day (BID) | ORAL | Status: DC
  Administered 2021-10-03 – 2021-10-09 (×12): 0.25 mg
  Filled 2021-10-03 (×12): qty 1

## 2021-10-03 MED ORDER — MIRTAZAPINE 15 MG PO TBDP
7.5000 mg | ORAL_TABLET | Freq: Every day | ORAL | Status: DC
  Administered 2021-10-03 – 2021-10-15 (×13): 7.5 mg
  Filled 2021-10-03 (×13): qty 0.5

## 2021-10-03 MED ORDER — CLONAZEPAM 0.5 MG PO TABS: 0.2500 mg | ORAL_TABLET | Freq: Two times a day (BID) | ORAL | Status: DC

## 2021-10-03 MED ORDER — ATROPINE SULFATE 1 MG/10ML IJ SOSY: 1.0000 mg | PREFILLED_SYRINGE | INTRAMUSCULAR | Status: DC | PRN

## 2021-10-03 MED ORDER — BENZTROPINE MESYLATE 1 MG PO TABS
0.5000 mg | ORAL_TABLET | Freq: Two times a day (BID) | ORAL | Status: DC | PRN
  Filled 2021-10-03: qty 1

## 2021-10-03 MED ORDER — HALOPERIDOL 5 MG PO TABS
5.0000 mg | ORAL_TABLET | Freq: Two times a day (BID) | ORAL | Status: DC
  Administered 2021-10-03 – 2021-10-09 (×12): 5 mg
  Filled 2021-10-03 (×12): qty 1

## 2021-10-03 MED ORDER — MENTHOL 3 MG MT LOZG
1.0000 | LOZENGE | OROMUCOSAL | Status: DC | PRN
  Filled 2021-10-03: qty 9

## 2021-10-03 MED ORDER — TRAZODONE HCL 50 MG PO TABS: 50.0000 mg | ORAL_TABLET | Freq: Every evening | ORAL | Status: DC | PRN

## 2021-10-03 MED ORDER — ORAL CARE MOUTH RINSE
15.0000 mL | Freq: Two times a day (BID) | OROMUCOSAL | Status: DC
  Administered 2021-10-03 – 2021-10-16 (×19): 15 mL via OROMUCOSAL

## 2021-10-03 NOTE — Plan of Care (Signed)
?  Problem: Education: ?Goal: Knowledge of General Education information will improve ?Description: Including pain rating scale, medication(s)/side effects and non-pharmacologic comfort measures ?Outcome: Not Progressing ?Note: Patient transferred from room 128 due to low heart rate. No complaints of pain. Alert and oriented. Sitter is at the bedside. Will continue to monitor and assess. ?  ?

## 2021-10-03 NOTE — Consult Note (Signed)
[facility] Face-to-Face Psychiatry Consult   Reason for Consult: Consult for 61 year old woman transferred from [facility] with the hope that she can receive ECT treatment for schizophrenia Referring Physician:  Renae Gloss Patient Identification: [name] MRN:  [id number] Principal Diagnosis: Schizophrenia, paranoid (HCC) Diagnosis:  Principal Problem:   Schizophrenia, paranoid (HCC) Active Problems:   Hypokalemia, hypomagnesemia and hypophosphatemia   Protein-calorie malnutrition, severe (HCC)   Anemia of chronic disease   Sinus bradycardia   Total Time spent with patient: 1 hour  Subjective:   [name] is a 61 y.o. female patient admitted with "it was a lie".  HPI: Patient seen and chart reviewed.  Spoke with the patient's sister who is her guardian.  I have been following the case for several days while she was in [place] as well.  61 year old woman with a long history of schizophrenia was brought to the hospital under IVC by family because of agitation and aggression towards family as well as having been refusing her medicine and more worrisomely refusing to eat and losing significant weight.  While in [place] the patient continued to refuse to take any food by mouth.  Eventually NG tube was placed to provide nutrition and the patient has been given antipsychotics but is still resistant to eating anything.  The idea was propose that electroconvulsive therapy could be appropriate in this situation.  Patient has been given haloperidol which is her usual medication.  On interview today the patient tells me she is in the hospital because of "a lie".  She says that everyone has been lying about her.  She cannot specify exactly what she means by this.  I asked her if it was true that she was refusing to eat and she said that the hospital in [place] was giving her inappropriate crops to eat and therefore she would not eat them.  She would not address the fact that she had been starving  previously.  I tried to just get some basic history from the patient and she displayed florid delusions telling me that she has no sister and that her family all died in a spaceship accident.  Patient tells me that she lives with her own grandmother.  She appears disorganized and confused.  Refuses to acknowledge that she is having health problems or has lost weight.  I spoke with her sister who is her legal guardian.  Sister reports that patient had been off medicine for an unknown amount of time and that it has been common in the past when she is psychotic for her to refuse to eat but this is going on longer than usual.  Family is concerned that the patient is getting more and more unhealthy as she loses weight.  Past Psychiatric History: Patient has a long history of schizophrenia multiple prior hospitalizations.  She has an established history of refusing to eat and losing weight when she is psychotic.  It looks from what I can see in the chart like the most common medicine that has worked for her in the past has been haloperidol both oral and long-acting injectable.  Risk to Self:   Risk to Others:   Prior Inpatient Therapy:   Prior Outpatient Therapy:    Past Medical History:  Past Medical History:  Diagnosis Date   Anemia 10/02/2021   Non compliance w medication regimen    Schizophrenia [facility])     Past Surgical History:  Procedure Laterality Date   BIOPSY  09/25/2021   Procedure: BIOPSY;  Surgeon: Rob Bunting  P, MD;  Location: [facility];  Service: Gastroenterology;;   ESOPHAGOGASTRODUODENOSCOPY (EGD) WITH PROPOFOL [name] 09/25/2021   Procedure: ESOPHAGOGASTRODUODENOSCOPY (EGD) WITH PROPOFOL;  Surgeon: Rachael Fee, MD;  Location: [facility];  Service: Gastroenterology;  Laterality: [name];  With NGT placement   Family History: No family history on file. Family Psychiatric  History: None reported Social History:  Social History   Substance and Sexual Activity  Alcohol Use Not  Currently   Comment: Refuses to disclose how much     Social History   Substance and Sexual Activity  Drug Use No   Comment: Refuses to answer    Social History   Socioeconomic History   Marital status: Single    Spouse name: Not on file   Number of children: Not on file   Years of education: Not on file   Highest education level: Not on file  Occupational History   Not on file  Tobacco Use   Smoking status: Some Days    Packs/day: 0.50    Types: Cigarettes   Smokeless tobacco: Never  Vaping Use   Vaping Use: Every day  Substance and Sexual Activity   Alcohol use: Not Currently    Comment: Refuses to disclose how much   Drug use: No    Comment: Refuses to answer   Sexual activity: Never    Comment: refused to answer  Other Topics Concern   Not on file  Social History Narrative   Not on file   Social Determinants of Health   Financial Resource Strain: Not on file  Food Insecurity: Not on file  Transportation Needs: Not on file  Physical Activity: Not on file  Stress: Not on file  Social Connections: Not on file   Additional Social History:    Allergies:   Allergies  Allergen Reactions   Aspirin Hives and Other (See Comments)    Possibly caused hives, per sister   Ziprasidone Hcl Rash and Other (See Comments)    Caused twitching    Labs:  Results for orders placed or performed during the hospital encounter of 10/02/21 (from the past 48 hour(s))  HIV Antibody (routine testing w rflx)     Status: None   Collection Time: 10/02/21  8:36 PM  Result Value Ref Range   HIV Screen 4th Generation wRfx Non Reactive Non Reactive    Comment: Performed at [facility], [address], [place], [place] [address]  CBC     Status: Abnormal   Collection Time: 10/02/21  8:36 PM  Result Value Ref Range   WBC 6.9 4.0 - 10.5 K/uL   RBC 4.22 3.87 - 5.11 MIL/uL   Hemoglobin 11.7 (L) 12.0 - 15.0 g/dL   HCT 60.4 54.0 - 98.1 %   MCV 86.3 80.0 - 100.0 fL   MCH 27.7  26.0 - 34.0 pg   MCHC 32.1 30.0 - 36.0 g/dL   RDW 19.1 47.8 - 29.5 %   Platelets 288 150 - 400 K/uL   nRBC 0.0 0.0 - 0.2 %    Comment: Performed at [facility], [address] [place], [place] [address]  Creatinine, serum     Status: None   Collection Time: 10/02/21  8:36 PM  Result Value Ref Range   Creatinine, Ser 0.65 0.44 - 1.00 mg/dL   GFR, Estimated >86 >57 mL/min    Comment: (NOTE) Calculated using the CKD-EPI Creatinine Equation (2021) Performed at [facility], [address], [place], [place] [address]  Vitamin B12     Status: None   Collection Time: 10/02/21 10:42 PM  Result Value Ref Range   Vitamin B-12 836 180 - 914 pg/mL    Comment: (NOTE) This assay is not validated for testing neonatal or myeloproliferative syndrome specimens for Vitamin B12 levels. Performed at [facility], [address], [place], [place] [address]   Folate     Status: None   Collection Time: 10/02/21 10:42 PM  Result Value Ref Range   Folate 6.2 >5.9 ng/mL    Comment: Performed at [facility], [address] [place], [place] [address]  Iron and TIBC     Status: None   Collection Time: 10/02/21 10:42 PM  Result Value Ref Range   Iron 39 28 - 170 ug/dL   TIBC 829 562 - 130 ug/dL   Saturation Ratios 16 10.4 - 31.8 %   UIBC 212 ug/dL    Comment: Performed at [facility], [address] [place], [place] [address]  Ferritin     Status: None   Collection Time: 10/02/21 10:42 PM  Result Value Ref Range   Ferritin 111 11 - 307 ng/mL    Comment: Performed at [facility], [address] [place], [place] [address]  Reticulocytes     Status: None   Collection Time: 10/02/21 10:42 PM  Result Value Ref Range   Retic Ct Pct 1.1 0.4 - 3.1 %   RBC. 4.13 3.87 - 5.11 MIL/uL   Retic Count, Absolute 44.2 19.0 - 186.0 K/uL   Immature Retic Fract 10.0 2.3 - 15.9 %    Comment: Performed at [facility], [address]  [place], [place] [address]  TSH     Status: None   Collection Time: 10/02/21 10:42 PM  Result Value Ref Range   TSH 3.861 0.350 - 4.500 uIU/mL    Comment: Performed by a 3rd Generation assay with a functional sensitivity of <=0.01 uIU/mL. Performed at [facility], [address] [place], [place] [address]   C-reactive protein     Status: Abnormal   Collection Time: 10/02/21 10:42 PM  Result Value Ref Range   CRP 1.1 (H) <1.0 mg/dL    Comment: Performed at [facility], [address], [place], [place] [address]  T4, free     Status: None   Collection Time: 10/02/21 10:42 PM  Result Value Ref Range   Free T4 0.87 0.61 - 1.12 ng/dL    Comment: (NOTE) Biotin ingestion may interfere with free T4 tests. If the results are inconsistent with the TSH level, previous test results, or the clinical presentation, then consider biotin interference. If needed, order repeat testing after stopping biotin. Performed at [facility], [address] [place], [place] [address]   Type and screen     Status: None   Collection Time: 10/02/21 10:42 PM  Result Value Ref Range   ABO/RH(D) O POS    Antibody Screen NEG    Sample Expiration      10/05/2021,2359 Performed at [facility], [address] [place], [place] [address]   Glucose, capillary     Status: Abnormal   Collection Time: 10/03/21  1:17 AM  Result Value Ref Range   Glucose-Capillary 110 (H) 70 - 99 mg/dL    Comment: Glucose reference range applies only to samples taken after fasting for at least 8 hours.   Comment 1 Notify RN    Comment 2 Document in  Chart   MRSA Next Gen by PCR, Nasal     Status: None   Collection Time: 10/03/21  1:25 AM   Specimen: Nasal Mucosa; Nasal Swab  Result Value Ref Range   MRSA by PCR Next Gen NOT DETECTED NOT DETECTED    Comment: (NOTE) The GeneXpert MRSA Assay (FDA approved for NASAL specimens only), is one component of a comprehensive MRSA colonization  surveillance program. It is not intended to diagnose MRSA infection nor to guide or monitor treatment for MRSA infections. Test performance is not FDA approved in patients less than 68 years old. Performed at [facility], [address] [place], [place] [address]   RPR     Status: None   Collection Time: 10/03/21  3:35 AM  Result Value Ref Range   RPR Ser Ql NON REACTIVE NON REACTIVE    Comment: Performed at [facility], [address] [place], [place] [address]  Cortisol-am, blood     Status: None   Collection Time: 10/03/21  3:35 AM  Result Value Ref Range   Cortisol - AM 11.7 6.7 - 22.6 ug/dL    Comment: Performed at [facility], [address] [place], [place] [address]    Current Facility-Administered Medications  Medication Dose Route Frequency Provider Last Rate Last Admin   0.9 %  sodium chloride infusion   Intravenous Continuous Gertha Calkin, MD 50 mL/hr at 10/03/21 1609 New Bag at 10/03/21 1609   acetaminophen (TYLENOL) tablet 650 mg  650 mg Oral Q6H PRN Gertha Calkin, MD       Or   acetaminophen (TYLENOL) suppository 650 mg  650 mg Rectal Q6H PRN Gertha Calkin, MD       atropine 1 MG/10ML injection 1 mg  1 mg Intravenous Q5 Min x 3 PRN Manuela Schwartz, NP       benztropine (COGENTIN) tablet 0.5 mg  0.5 mg Oral BID PRN Alford Highland, MD       chlorhexidine (PERIDEX) 0.12 % solution 15 mL  15 mL Mouth Rinse BID Alford Highland, MD   15 mL at 10/03/21 0905   clonazePAM (KLONOPIN) tablet 0.25 mg  0.25 mg Per Tube BID Meron Bocchino, [name], MD       feeding supplement (OSMOLITE 1.2 CAL) liquid 1,000 mL  1,000 mL Per Tube Continuous Serenitee Fuertes, [name], MD 60 mL/hr at 10/03/21 1710 1,000 mL at 10/03/21 1710   feeding supplement (PROSource TF) liquid 45 mL  45 mL Per Tube Daily Alford Highland, MD   45 mL at 10/03/21 1608   free water 120 mL  120 mL Per Tube Q6H Irena Cords V, MD   120 mL at 10/03/21 1711   haloperidol (HALDOL) tablet 5 mg  5 mg Per Tube  BID [name] Stillinger, [name], MD       heparin injection 5,000 Units  5,000 Units Subcutaneous Q8H Gertha Calkin, MD   5,000 Units at 10/03/21 1347   MEDLINE mouth rinse  15 mL Mouth Rinse q12n4p Alford Highland, MD   15 mL at 10/03/21 1608   menthol-cetylpyridinium (CEPACOL) lozenge 3 mg  1 lozenge Oral PRN Alford Highland, MD       mirtazapine (REMERON SOL-TAB) disintegrating tablet 7.5 mg  7.5 mg Per Tube QHS Wieting, Richard, MD       multivitamin with minerals tablet 1 tablet  1 tablet Per Tube Daily Gertha Calkin, MD   1 tablet at 10/03/21 0915   thiamine (  B-1) injection 100 mg  100 mg Intravenous Daily Gertha Calkin, MD   100 mg at 10/03/21 0915   traZODone (DESYREL) tablet 50 mg  50 mg Oral QHS PRN Alford Highland, MD        Musculoskeletal: Strength & Muscle Tone: decreased Gait & Station: unable to stand Patient leans: [name]            Psychiatric Specialty Exam:  Presentation  General Appearance: Disheveled  Eye Contact:Minimal  Speech:Clear and Coherent; Normal Rate  Speech Volume:Normal  Handedness:Right   Mood and Affect  Mood:Dysphoric; Irritable  Affect:Flat; Blunt   Thought Process  Thought Processes:Coherent; Linear  Descriptions of Associations:Intact  Orientation:Partial  Thought Content:Paranoid Ideation  History of Schizophrenia/Schizoaffective disorder:Yes  Duration of Psychotic Symptoms:Greater than six months  Hallucinations:Hallucinations: None  Ideas of Reference:None  Suicidal Thoughts:Suicidal Thoughts: No  Homicidal Thoughts:Homicidal Thoughts: No   Sensorium  Memory:Immediate Fair; Recent Fair; Remote Fair  Judgment:Fair  Insight:Fair   Executive Functions  Concentration:Fair  Attention Span:Fair  Recall:Fair  Fund of Knowledge:Fair  Language:Fair   Psychomotor Activity  Psychomotor Activity:Psychomotor Activity: Normal   Assets  Assets:Communication Skills; Resilience; Financial Resources/Insurance;  Desire for Improvement; Physical Health; Social Support   Sleep  Sleep:Sleep: Fair   Physical Exam: Physical Exam Vitals and nursing note reviewed.  Constitutional:      Appearance: She is cachectic.  HENT:     Head: Normocephalic and atraumatic.     Mouth/Throat:     Pharynx: Oropharynx is clear.  Eyes:     Pupils: Pupils are equal, round, and reactive to light.  Cardiovascular:     Rate and Rhythm: Normal rate and regular rhythm.  Pulmonary:     Effort: Pulmonary effort is normal.     Breath sounds: Normal breath sounds.  Abdominal:     General: Abdomen is flat.     Palpations: Abdomen is soft.  Musculoskeletal:        General: Normal range of motion.  Skin:    General: Skin is warm and dry.  Neurological:     General: No focal deficit present.     Mental Status: She is alert. Mental status is at baseline.  Psychiatric:        Attention and Perception: She is inattentive.        Mood and Affect: Mood normal. Affect is inappropriate.        Speech: Speech is tangential.        Behavior: Behavior is agitated. Behavior is not aggressive.        Thought Content: Thought content is paranoid and delusional.        Cognition and Memory: Cognition is impaired. Memory is impaired.        Judgment: Judgment is inappropriate.   Review of Systems  Constitutional: Negative.   HENT: Negative.    Eyes: Negative.   Respiratory: Negative.    Cardiovascular: Negative.   Gastrointestinal: Negative.   Musculoskeletal: Negative.   Skin: Negative.   Neurological: Negative.   Psychiatric/Behavioral:  Negative for depression, hallucinations, substance abuse and suicidal ideas. The patient is nervous/anxious.   Blood pressure (!) 109/57, pulse (!) 58, temperature 98.5 F (36.9 C), temperature source Oral, resp. rate 18, SpO2 100 %. There is no height or weight on file to calculate BMI.  Treatment Plan Summary: Medication management and Plan Case reviewed chart reviewed.  Patient is  certainly underweight but appears stable at the moment.  Blood pressure runs a little bit low and  so does her pulse but hospitalists have been monitoring that and it is stable.  Last EKG was unremarkable except for relative bradycardia.  Electrolytes have been fairly stable with current oral regimen of tube feeds.  Patient has no history of heart attack stroke or brain surgery.  Head CT was unremarkable.  Although ECT is not usually a first line treatment for schizophrenia it still has a clear role to play particularly in patients such as this who are very resistant to treatment and in whom their symptoms are becoming life-threatening.  My plan is to restart the Haldol oral as part of her tube feedings.  I have spoken to her sister and explained the treatment plan.  I explained that I can hope that if she is going to respond we will see a response within a few treatments.  Discussed potential risks and benefits.  Sister agreed to meet me at the hospital on Sunday to sign consent forms.  Orders placed to hold tube feeds at midnight Sunday night going into Monday morning and to make her n.p.o. at the same time. I should say also that it is clear to me that the patient lacks capacity to make a decision 1 way or another about medical treatment as she refuses to acknowledge anything about her current medical situation and is clearly very out of touch with reality.  Disposition:  See description of plan above.  For now the patient requires treatment on the medical service because of poor self-care and tube feeds.  Mordecai Rasmussen, MD 10/03/2021 6:27 PM

## 2021-10-03 NOTE — Progress Notes (Signed)
Notified Jon Billings NP about bladder scan results and atropine orders. New orders to perform intermittent catherization and to only administer atropine if and when patient is symptomatic. ?

## 2021-10-03 NOTE — Progress Notes (Addendum)
Initial Nutrition Assessment ? ?DOCUMENTATION CODES:  ? ?Underweight, Severe malnutrition in context of chronic illness ? ?INTERVENTION:  ? ?Continue Osmolite 1.2 @ 60 ml/hr via NGT  ? ?45 ml Prosource TF daily ? ?Tube feeding regimen provides 1768 kcal (100% of needs), 91 grams of protein, and 1181 ml of H2O.   ? ?NUTRITION DIAGNOSIS:  ? ?Severe Malnutrition related to chronic illness (paranoid schizophrenia) as evidenced by percent weight loss, moderate fat depletion, severe fat depletion, moderate muscle depletion, severe muscle depletion. ? ?GOAL:  ? ?Patient will meet greater than or equal to 90% of their needs ? ?MONITOR:  ? ?Diet advancement, Labs, Weight trends, TF tolerance, Skin, I & O's ? ?REASON FOR ASSESSMENT:  ? ?Malnutrition Screening Tool ?  ? ?ASSESSMENT:  ? ?[name] is a 61 y.o. female with medical history significant of paranoid schizophrenia presented to the emergency room on 09/12/21 brought by family for not 8 drinking and eating and becoming hypoglycemic.  Patient is not taking her meds and is having hallucinations and delusional thoughts. ? ?Pt admitted with paranoid schizophrenia.  ? ?3/9- NGT placed ?  ?Reviewed I/O's: +128 ml x 24 hours ? ?UOP: 425 ml x 24 hours ? ?Case discussed with RN and RNCM. Pt was transferred from [facility] yesterday for consideration of ECT treatments. Per RN, pt is awaiting psych evaluation.  ? ?Per RN, pt is NPO and tolerating TF well. Per KUB on 10/02/21, tip of tube is in the stomach. Noted Osmolite 1.2 infusion via NGT @ 60 ml/hr.  ? ?Spoke with pt at bedside, who has very flat affect. She reports that she had not been eating well PTA due to "too many intruders in my home". When RD asked if she had been under increased stress PTA, she reported "yes". Noted pt with very poor oral care (foul smelling breath and poor dentition); reported to RN, who reports she will perform oral care. Pt reports that her weight chronically " goes up and down, however, she unable to  provide a base weight or range, other than she routinely gains lost weight back. Pt denies weakness or falls PTA and shares that she always wears baggy clothes, so she is unsure if her clothes fit her differently. She denies any nausea, vomiting, or abdominal distention.  ? ?Reviewed wt hx; pt has experienced a 13.9% wt loss over the past 6 months, which is significant for time frame.  ? ?Medications reviewed and include remeron, thiamine and 0.9% sodium chloride infusion @ 50 ml/hr.  ? ?Labs reviewed: CBGS: 110.  ? ?NUTRITION - FOCUSED PHYSICAL EXAM: ? ?Flowsheet Row Most Recent Value  ?Orbital Region Severe depletion  ?Upper Arm Region Moderate depletion  ?Thoracic and Lumbar Region Severe depletion  ?Buccal Region Moderate depletion  ?Temple Region Severe depletion  ?Clavicle Bone Region Severe depletion  ?Clavicle and Acromion Bone Region Severe depletion  ?Scapular Bone Region Severe depletion  ?Dorsal Hand Moderate depletion  ?Patellar Region Moderate depletion  ?Anterior Thigh Region Moderate depletion  ?Posterior Calf Region Moderate depletion  ?Edema (RD Assessment) None  ?Hair Reviewed  ?Eyes Reviewed  ?Mouth Reviewed  ?Skin Reviewed  ?Nails Reviewed  ? ?  ? ? ?Diet Order:   ?Diet Order   ? ? None  ? ?  ? ? ?EDUCATION NEEDS:  ? ?Education needs have been addressed ? ?Skin:  Skin Assessment: Reviewed RN Assessment ? ?Last BM:  Unknown ? ?Height:  ? ?Ht Readings from Last 1 Encounters:  ?09/25/21 5\' 4"  (1.626  m)  ? ? ?Weight:  ? ?Wt Readings from Last 1 Encounters:  ?09/28/21 43.3 kg  ? ? ?Ideal Body Weight:  54.5 kg ? ?BMI:  There is no height or weight on file to calculate BMI. ? ?Estimated Nutritional Needs:  ? ?Kcal:  1750-1950 ? ?Protein:  90-105 grams ? ?Fluid:  > 1.7 L ? ? ? ?Levada Schilling, RD, LDN, CDCES ?Registered Dietitian II ?Certified Diabetes Care and Education Specialist ?Please refer to [facility] for RD and/or RD on-call/weekend/after hours pager  ?

## 2021-10-03 NOTE — Assessment & Plan Note (Addendum)
Condition has resolved

## 2021-10-03 NOTE — Progress Notes (Signed)
Pt on 0smolite 1.2 cal at 43ml/hr but no order to check blood sugar. NP Jon Billings made aware but no new order place. Will continue to monitor. ?

## 2021-10-03 NOTE — Progress Notes (Signed)
?  Progress Note ? ? ?Patient: [name] MBT:[id number] DOB: 15-Feb-1961 DOA: 10/02/2021     1 ?DOS: the patient was seen and examined on 10/03/2021 ?  ? ?Assessment and Plan: ?* Schizophrenia, paranoid (HCC) ?Patient sent from [facility] over to [facility] for evaluation by psychiatry for potential ECT.  Continue clonazepam and Remeron.  Other medications as needed.  Case discussed with Dr. Toni Amend to evaluate today.  Patient answers some simple questions appropriately and follows commands. ? ?Sinus bradycardia ?Bradycardia overnight while sleeping.  We will continue to monitor.  Transfer out of ICU to progressive care. ? ?Hypokalemia, hypomagnesemia and hypophosphatemia ?Electrolytes normal range ? ? ?Protein-calorie malnutrition, severe (HCC) ?Continue tube feeding ? ? ?Anemia of chronic disease ?Last hemoglobin 11.7.  With ferritin being above 100 less likely iron deficiency anemia.  TIBC also low. ? ? ? ? ?  ? ?Subjective: Patient answers some simple yes or no questions.  Offers no complaints.  Transferred to the ICU overnight secondary to bradycardia while sleeping. ? ?Physical Exam: ?Vitals:  ? 10/03/21 0733 10/03/21 0900 10/03/21 1000 10/03/21 1100  ?BP: (!) 101/56 100/62 (!) 107/57 102/61  ?Pulse:  62 (!) 57 66  ?Resp:  13 13 (!) 23  ?Temp: 98.5 ?F (36.9 ?C)  98.4 ?F (36.9 ?C)   ?TempSrc: Oral  Oral   ?SpO2:  99% 100% 97%  ? ?Physical Exam ?HENT:  ?   Head: Normocephalic.  ?   Mouth/Throat:  ?   Pharynx: No oropharyngeal exudate.  ?Eyes:  ?   General: Lids are normal.  ?   Conjunctiva/sclera: Conjunctivae normal.  ?Cardiovascular:  ?   Rate and Rhythm: Normal rate and regular rhythm.  ?   Heart sounds: Normal heart sounds, S1 normal and S2 normal.  ?Pulmonary:  ?   Breath sounds: No decreased breath sounds, wheezing, rhonchi or rales.  ?Abdominal:  ?   Palpations: Abdomen is soft.  ?   Tenderness: There is no abdominal tenderness.  ?Musculoskeletal:  ?   Right lower leg: No swelling.  ?   Left lower  leg: No swelling.  ?Skin: ?   General: Skin is warm.  ?   Findings: No rash.  ?Neurological:  ?   Mental Status: She is alert.  ?   Comments: Able to straight leg raise.  Answers questions appropriately.  ?  ?Data Reviewed: ?Laboratory and radiological data reviewed from this hospital stay ? ?Family Communication: Left message for POA. ? ?Disposition: ?Status is: Inpatient ?Remains inpatient appropriate because: Has NG tube in and receiving tube feeds ? ?Planned Discharge Destination: Home ? ?Author: ?Alford Highland, MD ?10/03/2021 1:07 PM ? ?For on call review [email or web address].  ?

## 2021-10-03 NOTE — TOC Initial Note (Signed)
Transition of Care (TOC) - Initial/Assessment Note  ? ? ?Patient Details  ?Name: [name] ?MRN: [id number] ?Date of Birth: 19-Jan-1961 ? ?Transition of Care (TOC) CM/SW Contact:    ?Allayne Butcher, RN ?Phone Number: ?10/03/2021, 2:19 PM ? ?Clinical Narrative:                 ?Patient transferred over to [facility] for ECT from [facility].  Patient was admitted to [facility] on 2/24 for psychosis.  Patient's sister is legal guardian.  She has a NG feeding tube because she has been refusing to eat. ?PT worked with her today and recommends no follow up.   ?Patient will be followed by psychiatry.   ? ?TOC will follow.  ? ?Expected Discharge Plan: Home/Self Care ?Barriers to Discharge: Continued Medical Work up ? ? ?Patient Goals and CMS Choice ?  ?  ?  ? ?Expected Discharge Plan and Services ?Expected Discharge Plan: Home/Self Care ?  ?  ?  ?  ?                ?  ?  ?  ?  ?  ?  ?  ?  ?  ?  ? ?Prior Living Arrangements/Services ?  ?  ?Patient language and need for interpreter reviewed:: Yes ?Do you feel safe going back to the place where you live?: Yes      ?Need for Family Participation in Patient Care: Yes (Comment) ?Care giver support system in place?: Yes (comment) ?  ?Criminal Activity/Legal Involvement Pertinent to Current Situation/Hospitalization: No - Comment as needed ? ?Activities of Daily Living ?  ?  ? ?Permission Sought/Granted ?  ?  ?   ?   ?   ?   ? ?Emotional Assessment ?Appearance:: Appears stated age ?  ?  ?  ?Alcohol / Substance Use: Not Applicable ?Psych Involvement: Yes (comment) ? ?Admission diagnosis:  AMS (altered mental status) [R41.82] ?Patient Active Problem List  ? Diagnosis Date Noted  ? Sinus bradycardia 10/03/2021  ? Anemia of chronic disease 10/02/2021  ? Hypomagnesemia 09/22/2021  ? Hypokalemia 09/22/2021  ? Hypophosphatemia 09/21/2021  ? Hyponatremia 09/20/2021  ? Metabolic acidosis 09/16/2021  ? Protein-calorie malnutrition, severe (HCC) 03/27/2020  ? Anorexia 03/26/2020  ? Hypokalemia,  hypomagnesemia and hypophosphatemia 03/26/2020  ? Hypocalcemia 03/26/2020  ? Fall at home, initial encounter 03/26/2020  ? Recurrent hypoglycemia and severe malnutrition 03/14/2020  ? Paranoid schizophrenia (HCC) 03/01/2020  ? Noncompliance with diet and medication regimen   ? Psychoses (HCC)   ? Tobacco abuse 03/12/2014  ? Hypokalemic alkalosis 11/11/2011  ? Medically noncompliant 11/11/2011  ? Schizophrenia, paranoid (HCC) 11/07/2011  ? ?PCP:  [facility] ?Pharmacy:   ?[facility] - [place], [place] - [address] ?[address] ?[address] ?[place] [address] ?Phone: [phone] Fax: [phone] ? ? ? ? ?Social Determinants of Health (SDOH) Interventions ?  ? ?Readmission Risk Interventions ?No flowsheet data found. ? ? ?

## 2021-10-03 NOTE — Assessment & Plan Note (Addendum)
Hemoglobin stable 

## 2021-10-03 NOTE — Progress Notes (Signed)
Provider notified at 0017 due to patient ECG HR dropping in the 30's-40's. Patient  looked to have been trending in the 50's. Patient was alert and oriented , weakness noted, but no other symptoms noted. Orders from Dr. Allena Katz given to transfer patient, pt. HR back to 50's at time of order, atropine also ordered by Dr. Allena Katz via IV, atropine not given on unit, pt. Transferred to ICU, report given to [facility] and also ICU charge nurse [facility]. Patient had arrived just prior to start of night shift and nurse was awaiting orders to be put in, IV fluids and tube feed started prior to patient transfer. Sitter at bedside at transfer and went to ICU with patient. Patient was alert and oriented and answering coherently to questions prior to and during transfer. Patient asked prior to transfer if she had voided today and stated "no". This nurse had not bladder scanned patient prior to transfer as patients voiding history prior to transfer had not had chance to be thoroughly assessed, noted that patient is hospitalized for malnourishment refusing oral intake at home. ICU nurse aware that patient last void unknown prior to transfer and will follow up.  ?

## 2021-10-03 NOTE — Evaluation (Signed)
Physical Therapy Evaluation ?Patient Details ?Name: [name] ?MRN: [id number] ?DOB: Nov 24, 1960 ?Today's Date: 10/03/2021 ? ?History of Present Illness ? Courtnei Cardinal is a 60yoF who comes to [facility] direct transfer from [facility] for ECT therapy. At home pt was experiencing hallucinations and delusional thoughts, known history of paranoid schizophrenia and recent medication noncompliance. Pt was not eating or drinking at home.  ?Clinical Impression ? Pt admitted c above Dx. Pt shows functional limitations due to the deficits listed below (see "PT Problem List"). Patient agreeable to PT evaluation. PLOF and home setup obtained easily, however pt seems increasingly less tolerance with excessive questioning. Pt able to perform bed mobility, transfers quite easily, at her PLOF of independence. Standing balance appears excellent. BP HR stable while standing in place. AMB deferred at time of evaluation, however this should be pursued by NSG. Patient's assessment reveals being very near baseline for basic ADL mobility, however PT will continue to follow until more extensive household/community distance mobility can be observed.   ?   ? ?Recommendations for follow up therapy are one component of a multi-disciplinary discharge planning process, led by the attending physician.  Recommendations may be updated based on patient status, additional functional criteria and insurance authorization. ? ?Follow Up Recommendations No PT follow up ? ?  ?Assistance Recommended at Discharge None  ?Patient can return home with the following ?   ? ?  ?Equipment Recommendations    ?Recommendations for Other Services ?    ?  ?Functional Status Assessment Patient has had a recent decline in their functional status and demonstrates the ability to make significant improvements in function in a reasonable and predictable amount of time.  ? ?  ?Precautions / Restrictions Precautions ?Precautions: Fall ?Precaution Comments: NGT ?Restrictions ?Weight Bearing  Restrictions: No  ? ?  ? ?Mobility ? Bed Mobility ?Overal bed mobility: Independent ?  ?  ?  ?  ?  ?  ?  ?  ? ?Transfers ?Overall transfer level: Needs assistance ?Equipment used: None ?Transfers: Sit to/from Stand ?Sit to Stand: Independent, From elevated surface, Supervision ?  ?  ?  ?  ?  ?General transfer comment: ICU lijnes/leads, elevated ICU bed ?  ? ?Ambulation/Gait ?Ambulation/Gait assistance:  (deferred at eval) ?  ?  ?  ?  ?  ?  ?General Gait Details: able to march in place x20 without LOB, able tostand eyes closed for 15 second with minimal sway ? ?Stairs ?  ?  ?  ?  ?  ? ?Wheelchair Mobility ?  ? ?Modified Rankin (Stroke Patients Only) ?  ? ?  ? ?Balance   ?  ?  ?  ?  ?  ?  ?  ?  ?  ?  ?  ?  ?  ?  ?  ?  ?  ?  ?   ? ? ? ?Pertinent Vitals/Pain Pain Assessment ?Pain Assessment: No/denies pain  ? ? ?Home Living Family/patient expects to be discharged to:: Private residence ?Living Arrangements:  (grandmother) ?Available Help at Discharge: Family ?Type of Home: House ?Home Access: Stairs to enter ?Entrance Stairs-Rails: Right;Left ?Entrance Stairs-Number of Steps: 3 ?  ?Home Layout: One level ?Home Equipment: None ?   ?  ?Prior Function Prior Level of Function : Independent/Modified Independent ?  ?  ?  ?  ?  ?  ?  ?  ?  ? ? ?Hand Dominance  ? Dominant Hand: Right ? ?  ?Extremity/Trunk Assessment  ? Upper Extremity Assessment ?Upper Extremity  Assessment: Overall WFL for tasks assessed ?  ? ?Lower Extremity Assessment ?Lower Extremity Assessment: Overall WFL for tasks assessed ?  ? ?   ?Communication  ?    ?Cognition Arousal/Alertness: Awake/alert ?Behavior During Therapy: [facility] for tasks assessed/performed ?  ?  ?  ?  ?  ?  ?  ?  ?  ?  ?  ?  ?  ?  ?  ?  ?  ?General Comments: oriented to self, month, year, current location ?  ?  ? ?  ?General Comments   ? ?  ?Exercises    ? ?Assessment/Plan  ?  ?PT Assessment Patient needs continued PT services  ?PT Problem List Decreased activity tolerance;Decreased  mobility ? ?   ?  ?PT Treatment Interventions DME instruction;Functional mobility training;Therapeutic activities;Therapeutic exercise   ? ?PT Goals (Current goals can be found in the Care Plan section)  ?Acute Rehab PT Goals ?PT Goal Formulation: Patient unable to participate in goal setting ? ?  ?Frequency Min 2X/week ?  ? ? ?Co-evaluation   ?  ?  ?  ?  ? ? ?  ?AM-PAC PT "6 Clicks" Mobility  ?Outcome Measure Help needed turning from your back to your side while in a flat bed without using bedrails?: None ?Help needed moving from lying on your back to sitting on the side of a flat bed without using bedrails?: None ?Help needed moving to and from a bed to a chair (including a wheelchair)?: None ?Help needed standing up from a chair using your arms (e.g., wheelchair or bedside chair)?: None ?Help needed to walk in hospital room?: A Little ?Help needed climbing 3-5 steps with a railing? : A Little ?6 Click Score: 22 ? ?  ?End of Session   ?Activity Tolerance: Patient tolerated treatment well;No increased pain ?Patient left: in bed;with nursing/sitter in room;with call bell/phone within reach ?Nurse Communication: Mobility status ?PT Visit Diagnosis: Difficulty in walking, not elsewhere classified (R26.2);Unsteadiness on feet (R26.81);Muscle weakness (generalized) (M62.81) ?  ? ?Time: 7829-5621 ?PT Time Calculation (min) (ACUTE ONLY): 15 min ? ? ?Charges:   PT Evaluation ?$PT Eval Low Complexity: 1 Low ?  ?  ?   ?12:57 PM, 10/03/21 ?Rosamaria Lints, PT, DPT ?Physical Therapist - [place] ?[facility]  ?[phone] (ASCOM)   ? ?Sayvon Arterberry C ?10/03/2021, 12:50 PM ? ?

## 2021-10-03 NOTE — Progress Notes (Signed)
Cross Cover ?Patient with acute urinary retention. In and out cath ordered. Heart rate remains in 50s with stable pressure ?Cortisol level added to am labs. Previously noted to be normal in 2021 ? ?

## 2021-10-04 NOTE — Evaluation (Signed)
Occupational Therapy Evaluation ?Patient Details ?Name: [name] ?MRN: [id number] ?DOB: Jun 19, 1961 ?Today's Date: 10/04/2021 ? ? ?History of Present Illness [name] is a 60yoF who comes to [facility] direct transfer from [facility] for ECT therapy. At home pt was experiencing hallucinations and delusional thoughts, known history of paranoid schizophrenia and recent medication noncompliance. Pt was not eating or drinking at home.  ? ?Clinical Impression ?  ?[name] was seen for OT evaluation this date. Prior to hospital admission, pt was Independent in all I/ADLs and mobility. Pt Independently dons underwear in standing with single LE support, no LOBs. MIN cues tooth brushing - cues for NGT mgmt. MOD I toileting using IV pole. Pt educated on importance of routines and mobilizing in hall daily to prevent deconditioning. Pt demonstrates baseline independence to perform ADL and mobility tasks. No skilled OT needs identified. Will sign off. Of note, pt may benefit from medication mgmt assessment however pt denies need at this time. Please re-consult if pt endorses willingness to participate.    ? ?Recommendations for follow up therapy are one component of a multi-disciplinary discharge planning process, led by the attending physician.  Recommendations may be updated based on patient status, additional functional criteria and insurance authorization.  ? ?Follow Up Recommendations ? No OT follow up  ?  ?Assistance Recommended at Discharge PRN  ?Patient can return home with the following Direct supervision/assist for medications management ? ?  ?Functional Status Assessment ? Patient has not had a recent decline in their functional status  ?Equipment Recommendations ? None recommended by OT  ?  ?Recommendations for Other Services   ? ? ?  ?Precautions / Restrictions Precautions ?Precautions: Fall ?Precaution Comments: NGT ?Restrictions ?Weight Bearing Restrictions: No  ? ?  ? ?Mobility Bed Mobility ?Overal bed mobility:  Independent ?  ?  ?  ?  ?  ?  ?  ?  ? ?Transfers ?Overall transfer level: Independent ?  ?  ?  ?  ?  ?  ?  ?  ?  ?  ? ?  ?Balance Overall balance assessment: Independent ?  ?  ?  ?  ?  ?  ?  ?  ?  ?  ?  ?  ?  ?  ?  ?  ?  ?  ?   ? ?ADL either performed or assessed with clinical judgement  ? ?ADL Overall ADL's : Independent;Needs assistance/impaired ?  ?  ?  ?  ?  ?  ?  ?  ?  ?  ?  ?  ?  ?  ?  ?  ?  ?  ?  ?General ADL Comments: Indepedently dons underwear in standing wiht single LE support no LOBs. MIN cues tooth brushing - cues for NGT mgmt. MOD I toileting using  ? ? ? ? ?Pertinent Vitals/Pain Pain Assessment ?Pain Assessment: No/denies pain  ? ? ? ?Hand Dominance Right ?  ?Extremity/Trunk Assessment Upper Extremity Assessment ?Upper Extremity Assessment: Overall WFL for tasks assessed ?  ?Lower Extremity Assessment ?Lower Extremity Assessment: Overall WFL for tasks assessed ?  ?  ?  ?Communication Communication ?Communication: No difficulties ?  ?Cognition Arousal/Alertness: Awake/alert ?Behavior During Therapy: [facility] for tasks assessed/performed ?Overall Cognitive Status: Within Functional Limits for tasks assessed ?  ?  ?  ?  ?  ?  ?  ?  ?  ?  ?  ?  ?  ?  ?  ?  ?General Comments: pleasant and participatory ?  ?  ? ?  Home Living Family/patient expects to be discharged to:: Private residence ?  ?Available Help at Discharge: Family ?Type of Home: House ?Home Access: Stairs to enter ?Entrance Stairs-Number of Steps: 3 ?Entrance Stairs-Rails: Right;Left ?Home Layout: One level ?  ?  ?  ?  ?  ?  ?  ?Home Equipment: None ?  ?  ?  ? ?  ?Prior Functioning/Environment Prior Level of Function : Independent/Modified Independent ?  ?  ?  ?  ?  ?  ?  ?  ?  ? ?  ?  ?OT Problem List: Decreased cognition;Decreased safety awareness ?  ?   ?   ?OT Goals(Current goals can be found in the care plan section) Acute Rehab OT Goals ?Patient Stated Goal: to go home ?OT Goal Formulation: With patient ?Time For Goal Achievement:  10/18/21 ?Potential to Achieve Goals: Good  ? ?AM-PAC OT "6 Clicks" Daily Activity     ?Outcome Measure Help from another person eating meals?: None ?Help from another person taking care of personal grooming?: A Little ?Help from another person toileting, which includes using toliet, bedpan, or urinal?: None ?Help from another person bathing (including washing, rinsing, drying)?: None ?Help from another person to put on and taking off regular upper body clothing?: None ?Help from another person to put on and taking off regular lower body clothing?: None ?6 Click Score: 23 ?  ?End of Session   ? ?Activity Tolerance: Patient tolerated treatment well ?Patient left: in bed;with call bell/phone within reach;with nursing/sitter in room ? ?OT Visit Diagnosis: Unsteadiness on feet (R26.81)  ?              ?Time: 1141-1200 ?OT Time Calculation (min): 19 min ?Charges:  OT General Charges ?$OT Visit: 1 Visit ?OT Evaluation ?$OT Eval Low Complexity: 1 Low ?OT Treatments ?$Self Care/Home Management : 8-22 mins ? ?Kathie Dike, M.S. OTR/L  ?10/04/21, 12:42 PM  ?ascom [phone] ? ?

## 2021-10-04 NOTE — Plan of Care (Signed)
  Problem: Health Behavior/Discharge Planning: Goal: Ability to manage health-related needs will improve Outcome: Progressing   Problem: Clinical Measurements: Goal: Respiratory complications will improve Outcome: Progressing   Problem: Clinical Measurements: Goal: Cardiovascular complication will be avoided Outcome: Progressing   Problem: Elimination: Goal: Will not experience complications related to urinary retention Outcome: Progressing   

## 2021-10-04 NOTE — Progress Notes (Signed)
?  Progress Note ? ? ?Patient: [name] ZWC:[id number] DOB: Jun 27, 1961 DOA: 10/02/2021     2 ?DOS: the patient was seen and examined on 10/04/2021 ?  ? ? ?Assessment and Plan: ?* Schizophrenia, paranoid (HCC) ?Patient sent from [employer] over to [facility] for evaluation by psychiatry for potential ECT.  Dr. Toni Amend psychiatry to start ECT treatments on Monday.  Haldol restarted through NG tube. ? ?Sinus bradycardia ?Bradycardia 2 nights ago while sleeping.  Heart rate at rest in the 50s and 60s.  Can potentially go lower while sleeping.  No further work-up. ? ?Hypokalemia, hypomagnesemia and hypophosphatemia ?Last potassium 4.1, last phosphorus 4.1 and last magnesium 2.4. ? ? ?Protein-calorie malnutrition, severe (HCC) ?Continue tube feeding.  Patient stated that she wants to eat so he did get speech therapy but then refused to work with speech therapy today. ? ? ?Anemia of chronic disease ?Last hemoglobin 11.7.  With ferritin being above 100 less likely iron deficiency anemia.  TIBC also low. ? ? ? ? ?  ? ?Subjective: Patient stated she did not want ECT treatments she just wanted to eat and go home.  Then refused to work with speech therapy when speech therapy came by.  Physically patient felt okay. ? ?Physical Exam: ?Vitals:  ? 10/04/21 0404 10/04/21 0500 10/04/21 0713 10/04/21 1117  ?BP: 107/66  (!) 96/51 (!) 96/57  ?Pulse: (!) 59  68 61  ?Resp: 16  18 17   ?Temp: (!) 97.5 ?F (36.4 ?C) 97.6 ?F (36.4 ?C) 98.7 ?F (37.1 ?C) 98.9 ?F (37.2 ?C)  ?TempSrc: Oral     ?SpO2: 100%  98% 99%  ? ?Physical Exam ?HENT:  ?   Head: Normocephalic.  ?   Mouth/Throat:  ?   Pharynx: No oropharyngeal exudate.  ?Eyes:  ?   General: Lids are normal.  ?   Conjunctiva/sclera: Conjunctivae normal.  ?Cardiovascular:  ?   Rate and Rhythm: Normal rate and regular rhythm.  ?   Heart sounds: Normal heart sounds, S1 normal and S2 normal.  ?Pulmonary:  ?   Breath sounds: No decreased breath sounds, wheezing, rhonchi or rales.  ?Abdominal:   ?   Palpations: Abdomen is soft.  ?   Tenderness: There is no abdominal tenderness.  ?Musculoskeletal:  ?   Right lower leg: No swelling.  ?   Left lower leg: No swelling.  ?Skin: ?   General: Skin is warm.  ?   Findings: No rash.  ?Neurological:  ?   Mental Status: She is alert.  ?   Comments: Able to straight leg raise.  Answers questions appropriately.  ?  ?Data Reviewed: ?Laboratory data reviewed from this hospital stay ? ?Family Communication: Left message for legal guardian ? ?Disposition: ?Status is: Inpatient ?Remains inpatient appropriate because: Will undergo ECT treatment ? ?Planned Discharge Destination: Home ? ?Author: ? , MD ?10/04/2021 1:37 PM ? ?For on call review [email or web address].  ?

## 2021-10-04 NOTE — Progress Notes (Signed)
SLP Cancellation Note ? ?Patient Details ?Name: [name] ?MRN: [id number] ?DOB: Dec 14, 1960 ? ? ?Cancelled treatment:       Reason Eval/Treat Not Completed: Patient declined, no reason specified. Patient refused all offers of POs. Unable to assess. SLP to s/o; please reconsult if resuming diet and pt exhibits signs of dysphagia vs refusing to eat.  ? ?Rondel Baton, MS, CCC-SLP ?Speech-Language Pathologist ?[phone] ? ? ? ?[name] ?10/04/2021, 9:54 AM ? ? ?

## 2021-10-05 LAB — COMPREHENSIVE METABOLIC PANEL WITH GFR
ALT: 10 U/L (ref 0–44)
AST: 19 U/L (ref 15–41)
Albumin: 3.2 g/dL — ABNORMAL LOW (ref 3.5–5.0)
Alkaline Phosphatase: 55 U/L (ref 38–126)
Anion gap: 10 (ref 5–15)
BUN: 14 mg/dL (ref 6–20)
CO2: 30 mmol/L (ref 22–32)
Calcium: 9 mg/dL (ref 8.9–10.3)
Chloride: 98 mmol/L (ref 98–111)
Creatinine, Ser: 0.61 mg/dL (ref 0.44–1.00)
GFR, Estimated: >60 mL/min
Glucose, Bld: 119 mg/dL — ABNORMAL HIGH (ref 70–99)
Potassium: 4.1 mmol/L (ref 3.5–5.1)
Sodium: 138 mmol/L (ref 135–145)
Total Bilirubin: 0.3 mg/dL (ref 0.3–1.2)
Total Protein: 7 g/dL (ref 6.5–8.1)

## 2021-10-05 MED ORDER — DIPHENHYDRAMINE HCL 50 MG/ML IJ SOLN
50.0000 mg | Freq: Once | INTRAMUSCULAR | Status: DC
  Filled 2021-10-05: qty 1

## 2021-10-05 MED ORDER — HALOPERIDOL LACTATE 5 MG/ML IJ SOLN
5.0000 mg | Freq: Once | INTRAMUSCULAR | Status: DC
  Filled 2021-10-05: qty 1

## 2021-10-05 NOTE — Progress Notes (Signed)
?  Progress Note ? ? ?Patient: [name] DDU:[id number] DOB: 20-Feb-1961 DOA: 10/02/2021     3 ?DOS: the patient was seen and examined on 10/05/2021 ?  ? ? ?Assessment and Plan: ?* Schizophrenia, paranoid (HCC) ?Patient sent from [facility] over to [facility] for evaluation by psychiatry for potential ECT.  Dr. Toni Amend psychiatry to start ECT treatments on Monday.  Haldol restarted through NG tube.  ECT planned for Monday Wednesdays and Fridays.  Tube feedings will have to be held at 12:01 AM on Monday Wednesday and Friday. ? ?Sinus bradycardia ?Bradycardia while sleeping on the first night in the hospital.  Heart rate at rest in the 50s and 60s.  Can potentially go lower while sleeping.  No further work-up. ? ?Hypokalemia, hypomagnesemia and hypophosphatemia ?Replaced.  Continue tube feedings. ? ? ?Protein-calorie malnutrition, severe (HCC) ?Continue tube feedings until she eats on her own. ? ? ?Anemia of chronic disease ?Last hemoglobin 11.7.  With ferritin being above 100 less likely iron deficiency anemia.  TIBC also low. ? ? ? ? ?  ? ?Subjective: Patient physically feels okay and offers no complaints.  Patient states that she can eat but did not work with speech therapy yesterday. ? ?Physical Exam: ?Vitals:  ? 10/04/21 2345 10/04/21 2347 10/05/21 0400 10/05/21 0743  ?BP: (!) 91/47 (!) 92/54 (!) 93/53 (!) 100/58  ?Pulse: (!) 59 64 61 66  ?Resp: 18 18 18 17   ?Temp: 97.7 ?F (36.5 ?C)  98.2 ?F (36.8 ?C) 98.1 ?F (36.7 ?C)  ?TempSrc:    Oral  ?SpO2: 99% 99% 100% 100%  ? ?Physical Exam ?HENT:  ?   Head: Normocephalic.  ?   Mouth/Throat:  ?   Pharynx: No oropharyngeal exudate.  ?Eyes:  ?   General: Lids are normal.  ?   Conjunctiva/sclera: Conjunctivae normal.  ?Cardiovascular:  ?   Rate and Rhythm: Regular rhythm. Bradycardia present.  ?   Heart sounds: Normal heart sounds, S1 normal and S2 normal.  ?Pulmonary:  ?   Breath sounds: No decreased breath sounds, wheezing, rhonchi or rales.  ?Abdominal:  ?    Palpations: Abdomen is soft.  ?   Tenderness: There is no abdominal tenderness.  ?Musculoskeletal:  ?   Right lower leg: No swelling.  ?   Left lower leg: No swelling.  ?Skin: ?   General: Skin is warm.  ?   Findings: No rash.  ?Neurological:  ?   Mental Status: She is alert.  ?   Comments: Able to straight leg raise.  Answers questions appropriately.  ?  ?Data Reviewed: ?We will order labs for tomorrow ? ?Family Communication: Spoke with POA yesterday ? ?Disposition: ?Status is: Inpatient ?Remains inpatient appropriate because: Patient to start ECT treatments and will likely be here for few weeks. ?  ?Planned Discharge Destination: Home ? ? ?Author: ? , MD ?10/05/2021 4:40 PM ? ?For on call review [email or web address].  ?

## 2021-10-05 NOTE — Progress Notes (Signed)
Physical Therapy Treatment ?Patient Details ?Name: [name] ?MRN: [id number] ?DOB: [birth year] ?Today's Date: 10/05/2021 ? ? ?History of Present Illness [name] is a 60yoF who comes to [facility] direct transfer from [facility] for ECT therapy. At home pt was experiencing hallucinations and delusional thoughts, known history of paranoid schizophrenia and recent medication noncompliance. Pt was not eating or drinking at home. ? ?  ?PT Comments  ? ? Pt agrees.  RN in to pause tube feeding.  She is able to get up OOB and complete x 1 lap on unit with min guard.  Imbalances noticed but she is able to recover on her own.  She does not use a RW at baseline and did not use on during session.  She self limited gait to 1 lap but appeared to be able to walk further if needed.  Sitters in room and encouraged gait on unit with staff as tolerated. ?  ?Recommendations for follow up therapy are one component of a multi-disciplinary discharge planning process, led by the attending physician.  Recommendations may be updated based on patient status, additional functional criteria and insurance authorization. ? ?Follow Up Recommendations ? No PT follow up ?  ?  ?Assistance Recommended at Discharge Intermittent Supervision/Assistance  ?Patient can return home with the following A little help with walking and/or transfers;Assistance with cooking/housework;Help with stairs or ramp for entrance ?  ?Equipment Recommendations ?    ?  ?Recommendations for Other Services   ? ? ?  ?Precautions / Restrictions Precautions ?Precautions: Fall ?Precaution Comments: NGT ?Restrictions ?Weight Bearing Restrictions: No  ?  ? ?Mobility ? Bed Mobility ?Overal bed mobility: Independent ?  ?  ?  ?  ?  ?  ?  ?  ? ?Transfers ?Overall transfer level: Independent ?Equipment used: None ?Transfers: Sit to/from Stand ?Sit to Stand: Independent, From elevated surface, Supervision ?  ?  ?  ?  ?  ?  ?  ? ?Ambulation/Gait ?Ambulation/Gait assistance: Min guard ?Gait Distance  (Feet): 200 Feet ?Assistive device: None ?  ?Gait velocity: decreased ?  ?  ?  ? ? ?Stairs ?  ?  ?  ?  ?  ? ? ?Wheelchair Mobility ?  ? ?Modified Rankin (Stroke Patients Only) ?  ? ? ?  ?Balance Overall balance assessment: Mild deficits observed, not formally tested ?  ?  ?  ?  ?  ?  ?  ?  ?  ?  ?  ?  ?  ?  ?  ?  ?  ?  ?  ? ?  ?Cognition Arousal/Alertness: Awake/alert ?Behavior During Therapy: [facility] for tasks assessed/performed ?Overall Cognitive Status: Within Functional Limits for tasks assessed ?  ?  ?  ?  ?  ?  ?  ?  ?  ?  ?  ?  ?  ?  ?  ?  ?  ?  ?  ? ?  ?Exercises   ? ?  ?General Comments   ?  ?  ? ?Pertinent Vitals/Pain Pain Assessment ?Pain Assessment: No/denies pain  ? ? ?Home Living   ?  ?  ?  ?  ?  ?  ?  ?  ?  ?   ?  ?Prior Function    ?  ?  ?   ? ?PT Goals (current goals can now be found in the care plan section) Progress towards PT goals: Progressing toward goals ? ?  ?Frequency ? ? ? Min 2X/week ? ? ? ?  ?  PT Plan Current plan remains appropriate  ? ? ?Co-evaluation   ?  ?  ?  ?  ? ?  ?AM-PAC PT "6 Clicks" Mobility   ?Outcome Measure ? Help needed turning from your back to your side while in a flat bed without using bedrails?: None ?Help needed moving from lying on your back to sitting on the side of a flat bed without using bedrails?: None ?Help needed moving to and from a bed to a chair (including a wheelchair)?: None ?Help needed standing up from a chair using your arms (e.g., wheelchair or bedside chair)?: None ?Help needed to walk in hospital room?: A Little ?Help needed climbing 3-5 steps with a railing? : A Little ?6 Click Score: 22 ? ?  ?End of Session Equipment Utilized During Treatment: Gait belt ?Activity Tolerance: Patient tolerated treatment well;No increased pain ?Patient left: in bed;with nursing/sitter in room;with call bell/phone within reach ?Nurse Communication: Mobility status ?PT Visit Diagnosis: Difficulty in walking, not elsewhere classified (R26.2);Unsteadiness on feet  (R26.81);Muscle weakness (generalized) (M62.81) ?  ? ? ?Time: 1057-1106 ?PT Time Calculation (min) (ACUTE ONLY): 9 min ? ?Charges:  $Gait Training: 8-22 mins          ?          ? ?{Naomi Fitton Dionne Milo, PTA ?10/05/21, 11:38 AM ? ?

## 2021-10-06 ENCOUNTER — Inpatient Hospital Stay: Payer: Medicare Other | Admitting: Certified Registered"

## 2021-10-06 ENCOUNTER — Inpatient Hospital Stay: Payer: Medicare Other

## 2021-10-06 ENCOUNTER — Encounter: Payer: Self-pay | Admitting: Internal Medicine

## 2021-10-06 DIAGNOSIS — R4182 Altered mental status, unspecified: Secondary | ICD-10-CM | POA: Diagnosis not present

## 2021-10-06 DIAGNOSIS — F209 Schizophrenia, unspecified: Secondary | ICD-10-CM | POA: Diagnosis not present

## 2021-10-06 LAB — GLUCOSE, CAPILLARY
Glucose-Capillary: 115 mg/dL — ABNORMAL HIGH (ref 70–99)
Glucose-Capillary: 121 mg/dL — ABNORMAL HIGH (ref 70–99)
Glucose-Capillary: 144 mg/dL — ABNORMAL HIGH (ref 70–99)

## 2021-10-06 LAB — CBC
HCT: 36.8 % (ref 36.0–46.0)
Hemoglobin: 11.9 g/dL — ABNORMAL LOW (ref 12.0–15.0)
MCH: 27.9 pg (ref 26.0–34.0)
MCHC: 32.3 g/dL (ref 30.0–36.0)
MCV: 86.2 fL (ref 80.0–100.0)
Platelets: 285 10*3/uL (ref 150–400)
RBC: 4.27 MIL/uL (ref 3.87–5.11)
RDW: 12.7 % (ref 11.5–15.5)
WBC: 6.7 10*3/uL (ref 4.0–10.5)
nRBC: 0 % (ref 0.0–0.2)

## 2021-10-06 LAB — MAGNESIUM
Magnesium: 2.3 mg/dL (ref 1.7–2.4)
Magnesium: 2.5 mg/dL — ABNORMAL HIGH (ref 1.7–2.4)
Magnesium: 2.2 mg/dL (ref 1.7–2.4)

## 2021-10-06 LAB — ANA W/REFLEX IF POSITIVE: Anti Nuclear Antibody (ANA): NEGATIVE

## 2021-10-06 LAB — PROTIME-INR
INR: 1 (ref 0.8–1.2)
Prothrombin Time: 12.8 seconds (ref 11.4–15.2)

## 2021-10-06 LAB — PHOSPHORUS
Phosphorus: 4.6 mg/dL (ref 2.5–4.6)
Phosphorus: 4.7 mg/dL — ABNORMAL HIGH (ref 2.5–4.6)
Phosphorus: 4.5 mg/dL (ref 2.5–4.6)

## 2021-10-06 MED ORDER — OSMOLITE 1.2 CAL PO LIQD
1000.0000 mL | ORAL | Status: DC
  Administered 2021-10-06: 1000 mL

## 2021-10-06 MED ORDER — FREE WATER
200.0000 mL | Status: DC
  Administered 2021-10-06 – 2021-10-09 (×12): 200 mL

## 2021-10-06 MED ORDER — SUCCINYLCHOLINE CHLORIDE 200 MG/10ML IV SOSY
PREFILLED_SYRINGE | INTRAVENOUS | Status: DC | PRN
  Administered 2021-10-06: 60 mg via INTRAVENOUS

## 2021-10-06 MED ORDER — SODIUM CHLORIDE 0.9 % IV SOLN: INTRAVENOUS | Status: DC | PRN

## 2021-10-06 MED ORDER — SODIUM CHLORIDE 0.9 % IV SOLN
500.0000 mL | Freq: Once | INTRAVENOUS | Status: AC
  Administered 2021-10-06: 500 mL via INTRAVENOUS

## 2021-10-06 MED ORDER — OSMOLITE 1.2 CAL PO LIQD: 1000.0000 mL | ORAL | Status: DC

## 2021-10-06 MED ORDER — PROSOURCE TF PO LIQD
45.0000 mL | Freq: Three times a day (TID) | ORAL | Status: DC
  Administered 2021-10-06 – 2021-10-08 (×7): 45 mL
  Filled 2021-10-06 (×9): qty 45

## 2021-10-06 MED ORDER — METHOHEXITAL SODIUM 100 MG/10ML IV SOSY
PREFILLED_SYRINGE | INTRAVENOUS | Status: DC | PRN
  Administered 2021-10-06: 40 mg via INTRAVENOUS

## 2021-10-06 MED ORDER — OSMOLITE 1.5 CAL PO LIQD
1000.0000 mL | ORAL | Status: AC
  Administered 2021-10-06 – 2021-10-07 (×3): 1000 mL

## 2021-10-06 MED ORDER — VITAL HIGH PROTEIN PO LIQD: 1000.0000 mL | ORAL | Status: DC

## 2021-10-06 NOTE — Progress Notes (Signed)
?  Progress Note ? ? ?Patient: [name] [id number] DOB: 1960/07/31 DOA: 10/02/2021     4 ?DOS: the patient was seen and examined on 10/06/2021 ? ? ?Assessment and Plan: ?* Schizophrenia, paranoid ([place]) ?Patient is status post ECT treatment today.  Continue Haldol down NG tube.  Her main issue as she is not eating. ? ?Sinus bradycardia ?Bradycardia while sleeping on the first night in the hospital.  Heart rates have been in the 45s and 60s. ? ?Hypokalemia, hypomagnesemia and hypophosphatemia ?Replaced.  Continue tube feedings. ? ? ?Protein-calorie malnutrition, severe ([place]) ?Continue tube feedings until she eats on her own.  May end up needing a PEG tube ? ? ?Anemia of chronic disease ?Last hemoglobin 11.9.  With ferritin being above 100 less likely iron deficiency anemia.  TIBC also low. ? ? ? ? ?  ? ?Subjective: Patient seen after ECT treatment.  Feels okay.  Offers no complaints.  Answers a few yes or no questions.  Very short with her answers.  Sent over to [facility] for [facility] treatment secondary to schizophrenia and not eating ? ?Physical Exam: ?Vitals:  ? 10/06/21 0900 10/06/21 1114 10/06/21 1117 10/06/21 1535  ?BP: 118/64 (!) 93/59 (!) 95/59 (!) 96/58  ?Pulse: 80 60 65 69  ?Resp: 17 16  16   ?Temp:  98.3 ?F (36.8 ?C)  99.2 ?F (37.3 ?C)  ?TempSrc:  Oral  Oral  ?SpO2: 100% 97%  98%  ?Weight:      ?Height:      ? ?Physical Exam ?HENT:  ?   Head: Normocephalic.  ?   Mouth/Throat:  ?   Pharynx: No oropharyngeal exudate.  ?Eyes:  ?   General: Lids are normal.  ?   Conjunctiva/sclera: Conjunctivae normal.  ?Cardiovascular:  ?   Rate and Rhythm: Normal rate and regular rhythm.  ?   Heart sounds: Normal heart sounds, S1 normal and S2 normal.  ?Pulmonary:  ?   Breath sounds: No decreased breath sounds, wheezing, rhonchi or rales.  ?Abdominal:  ?   Palpations: Abdomen is soft.  ?   Tenderness: There is no abdominal tenderness.  ?Musculoskeletal:  ?   Right lower leg: No swelling.  ?   Left lower leg: No swelling.   ?Skin: ?   General: Skin is warm.  ?   Findings: No rash.  ?Neurological:  ?   Mental Status: She is alert.  ?   Comments: Answers questions appropriately.  ?  ?Data Reviewed: ?Last hemoglobin 11.9, creatinine 0.61 ? ?Family Communication: Spoke with patient's sister on the phone ? ?Disposition: ?Status is: Inpatient ?Remains inpatient appropriate because: Receiving ECT treatments for schizophrenia and not eating. ? ?Planned Discharge Destination: Home ? ? ?Author: ?Loletha Grayer, MD ?10/06/2021 4:30 PM ? ?For on call review [email or web address].  ?

## 2021-10-06 NOTE — Progress Notes (Signed)
PT Cancellation Note ? ?Patient Details ?Name: Carl Bleecker ?MRN: [id number] ?DOB: 06-07-1961 ? ? ?Cancelled Treatment:    Reason Eval/Treat Not Completed: Patient at procedure or test/unavailable ? ?Pt at ECT this am.  Returned in PM and she had recently walked with sitting x 1 lap on unit and had a bath.  Fatigued.  Sitter stated she had 2 small imbalances to left which is similar to yesterday.  Will reschedule for tomorrow. ? ? ?Danielle Dess ?10/06/2021, 3:42 PM ?

## 2021-10-06 NOTE — Progress Notes (Signed)
Nutrition Follow-up ? ?DOCUMENTATION CODES:  ? ?Underweight, Severe malnutrition in context of chronic illness ? ?INTERVENTION:  ? ?-Adjust TF via NGT to account for ECT treatments: ? ?Initiate Osmolite 1.5 @ 85 ml/hr  ? ?45 ml Prosource TF TID.   ? ?200 ml free water flush every 4 hours ? ?Tube feeding regimen provides 3180 kcal, 161 grams of protein, and 1554 ml of H2O.  Total free water: 2754 ml daily ? ?TF will be held Monday, Wednesday, and Friday due to ECT treatments. On average, TF regimen will provide 1817 kcals, 92 grams protein, and 888 ml free water (1573 ml daily). ? ?NUTRITION DIAGNOSIS:  ? ?Severe Malnutrition related to chronic illness (paranoid schizophrenia) as evidenced by percent weight loss, moderate fat depletion, severe fat depletion, moderate muscle depletion, severe muscle depletion. ? ?Ongoing ? ?GOAL:  ? ?Patient will meet greater than or equal to 90% of their needs ? ?Progressing  ? ?MONITOR:  ? ?Diet advancement, Labs, Weight trends, TF tolerance, Skin, I & O's ? ?REASON FOR ASSESSMENT:  ? ?Malnutrition Screening Tool ?  ? ?ASSESSMENT:  ? ?[name] is a 61 y.o. female with medical history significant of paranoid schizophrenia presented to the emergency room on 09/12/21 brought by family for not 8 drinking and eating and becoming hypoglycemic.  Patient is not taking her meds and is having hallucinations and delusional thoughts. ? ?3/9- NGT placed ?3/18- s/p BSE- refused all PO intake ?3/20- ECT treatments initiated ? ?Reviewed I/O's: +1.1 L x 24 hours amd +3.4 L since admission  ? ?Case discussed with MD; received permission to adjust TF orders due to ECT treatments (pt will be NPO Mondays, Wednesdays, and Fridays due to treatments).  ? ?Labs reviewed: CBGS: 110.   ? ?Diet Order:   ?Diet Order   ? ? None  ? ?  ? ? ?EDUCATION NEEDS:  ? ?Education needs have been addressed ? ?Skin:  Skin Assessment: Reviewed RN Assessment ? ?Last BM:  Unknown ? ?Height:  ? ?Ht Readings from Last 1  Encounters:  ?10/06/21 5\' 4"  (1.626 m)  ? ? ?Weight:  ? ?Wt Readings from Last 1 Encounters:  ?10/06/21 43 kg  ? ? ?Ideal Body Weight:  54.5 kg ? ?BMI:  Body mass index is 16.27 kg/m?. ? ?Estimated Nutritional Needs:  ? ?Kcal:  1750-1950 ? ?Protein:  90-105 grams ? ?Fluid:  > 1.7 L ? ? ? ?10/08/21, RD, LDN, CDCES ?Registered Dietitian II ?Certified Diabetes Care and Education Specialist ?Please refer to [facility] for RD and/or RD on-call/weekend/after hours pager  ?

## 2021-10-06 NOTE — Progress Notes (Signed)
Mobility Specialist - Progress Note ? ? 10/06/21 1545  ?Mobility  ?Activity Refused mobility  ? ? ?Pt declined mobility d/t fatigue. Per sitter, pt just finished ambulating hallway prior to entry. Will attempt session another date/time.  ? ? ?Filiberto Pinks ?Mobility Specialist ?10/06/21, 3:46 PM ? ? ? ? ?

## 2021-10-06 NOTE — Anesthesia Postprocedure Evaluation (Signed)
Anesthesia Post Note ? ?Patient: [name] ? ?Procedure(s) Performed: ECT TX ? ?Patient location during evaluation: PACU ?Anesthesia Type: General ?Level of consciousness: awake and alert ?Pain management: pain level controlled ?Vital Signs Assessment: post-procedure vital signs reviewed and stable ?Respiratory status: spontaneous breathing, nonlabored ventilation, respiratory function stable and patient connected to nasal cannula oxygen ?Cardiovascular status: blood pressure returned to baseline and stable ?Postop Assessment: no apparent nausea or vomiting ?Anesthetic complications: no ? ? ?No notable events documented. ? ? ?Last Vitals:  ?Vitals:  ? 10/06/21 0850 10/06/21 0900  ?BP: 114/62 118/64  ?Pulse: 81 80  ?Resp: 18 17  ?Temp:    ?SpO2: 99% 100%  ?  ?Last Pain:  ?Vitals:  ? 10/06/21 0703  ?TempSrc: Tympanic  ?PainSc:   ? ? ?  ?  ?  ?  ?  ?  ? ?Cleda Mccreedy Shilo Philipson ? ? ? ? ?

## 2021-10-06 NOTE — Progress Notes (Signed)
Assumed care of pt at 1900. Fatigue but easily arousable overnight. 1:1 IVC sitter maintained. NG tube in place in R nare at previous marking. Tube feeds held at 0000 for ECT therapy 3/20. Medication administration per MAR. See flowsheets for vitals and full assessment. Call bell within reach. Comfort and safety maintained.  ?

## 2021-10-06 NOTE — Anesthesia Preprocedure Evaluation (Addendum)
Anesthesia Evaluation  ?Patient identified by MRN, date of birth, ID band ?Patient awake ? ? ? ?Reviewed: ?Allergy & Precautions, NPO status , Patient's Chart, lab work & pertinent test results ? ?History of Anesthesia Complications ?Negative for: history of anesthetic complications ? ?Airway ?Mallampati: III ? ?TM Distance: >3 FB ?Neck ROM: full ? ? ? Dental ? ?(+) Chipped, Poor Dentition, Missing ?  ?Pulmonary ?neg shortness of breath, Current Smoker and Patient abstained from smoking.,  ?  ?Pulmonary exam normal ? ? ? ? ? ? ? Cardiovascular ?(-) angina(-) Past MI Normal cardiovascular exam ? ? ?  ?Neuro/Psych ?PSYCHIATRIC DISORDERS negative neurological ROS ? negative psych ROS  ? GI/Hepatic ?negative GI ROS, Neg liver ROS, neg GERD  ,  ?Endo/Other  ?negative endocrine ROS ? Renal/GU ?negative Renal ROS  ?negative genitourinary ?  ?Musculoskeletal ? ? Abdominal ?  ?Peds ? Hematology ?negative hematology ROS ?(+)   ?Anesthesia Other Findings ?Past Medical History: ?10/02/2021: Anemia ?No date: Non compliance w medication regimen ?No date: Schizophrenia [facility]) ? ?Past Surgical History: ?09/25/2021: BIOPSY ?    Comment:  Procedure: BIOPSY;  Surgeon: Milus Banister, MD;   ?             Location: [facility];  Service: Gastroenterology;; ?09/25/2021: ESOPHAGOGASTRODUODENOSCOPY (EGD) WITH PROPOFOL; [name] ?    Comment:  Procedure: ESOPHAGOGASTRODUODENOSCOPY (EGD) WITH  ?             PROPOFOL;  Surgeon: Milus Banister, MD;  Location: [facility]  ?             ENDOSCOPY;  Service: Gastroenterology;  Laterality: [name];  ?             With NGT placement ? ?BMI   ? Body Mass Index: 16.27 kg/m?  ?  ? ? Reproductive/Obstetrics ?negative OB ROS ? ?  ? ? ? ? ? ? ? ? ? ? ? ? ? ?  ?  ? ? ? ? ? ? ? ? ?Anesthesia Physical ?Anesthesia Plan ? ?ASA: 3 ? ?Anesthesia Plan: General  ? ?Post-op Pain Management:   ? ?Induction: Intravenous ? ?PONV Risk Score and Plan: Propofol infusion and TIVA ? ?Airway Management  Planned: Mask ? ?Additional Equipment:  ? ?Intra-op Plan:  ? ?Post-operative Plan:  ? ?Informed Consent: I have reviewed the patients History and Physical, chart, labs and discussed the procedure including the risks, benefits and alternatives for the proposed anesthesia with the patient or authorized representative who has indicated his/her understanding and acceptance.  ? ? ? ?Dental Advisory Given ? ?Plan Discussed with: Anesthesiologist, CRNA and Surgeon ? ?Anesthesia Plan Comments: (History and phone consent from the patients sister [name] Etienne-Lennon at [phone]  ? ?Sister consented for risks of anesthesia including but not limited to:  ?- adverse reactions to medications ?- risk of airway placement if required ?- damage to eyes, teeth, lips or other oral mucosa ?- nerve damage due to positioning  ?- sore throat or hoarseness ?- Damage to heart, brain, nerves, lungs, other parts of body or loss of life ? ?She voiced understanding.)  ? ? ? ? ? ?Anesthesia Quick Evaluation ? ?

## 2021-10-06 NOTE — Transfer of Care (Signed)
Immediate Anesthesia Transfer of Care Note ? ?Patient: [name] ? ?Procedure(s) Performed: ECT TX ? ?Patient Location: PACU ? ?Anesthesia Type:General ? ?Level of Consciousness: drowsy ? ?Airway & Oxygen Therapy: Patient Spontanous Breathing and Patient connected to nasal cannula oxygen ? ?Post-op Assessment: Report given to RN ? ?Post vital signs: stable ? ?Last Vitals:  ?Vitals Value Taken Time  ?BP 121/67 10/06/21 0838  ?Temp    ?Pulse 81 10/06/21 0840  ?Resp 10 10/06/21 0840  ?SpO2 100 % 10/06/21 0840  ?Vitals shown include unvalidated device data. ? ?Last Pain:  ?Vitals:  ? 10/06/21 0703  ?TempSrc: Tympanic  ?PainSc:   ?   ? ?  ? ?Complications: No notable events documented. ?

## 2021-10-06 NOTE — Procedures (Signed)
ECT SERVICES ?Physician?s Interval Evaluation & Treatment Note ? ?Patient Identification: [name] ?MRN:  [id number] ?Date of Evaluation:  10/06/2021 ?TX #: 1 ? ?MADRS:  ? ?MMSE:  ? ?P.E. Findings: ? ?Patient is somewhat emaciated certainly underweight.  Heart and lungs appear clear and normal.  Mucous membranes dry. ? ?Psychiatric Interval Note: ? ?Flat withdrawn not eating well.  Presents with delusions ? ?Subjective: ? ?Patient is a 61 y.o. female seen for evaluation for Electroconvulsive Therapy. ?Does not believe she needs any treatment.  Poor insight and judgment ? ?Treatment Summary: ? ? []   Right Unilateral             [x]  Bilateral ?  ?% Energy : 1.0 ms 35% ? ? Impedance: 1960 ohms ? ?Seizure Energy Index: 17,044 ?V squared ? ?Postictal Suppression Index: 68% ? ?Seizure Concordance Index: 98% ? ?Medications ? ?Pre Shock: Brevital 40 mg succinylcholine 60 mg ? ?Post Shock:   ? ?Seizure Duration: 23 seconds EMG 54 seconds EEG ? ? ?Comments: ?Follow-up with continued index course ? ?Lungs: ? ?[x]   Clear to auscultation              ? ?[]  Other:  ? ?Heart: ?  ? [x]   Regular rhythm             []  irregular rhythm ? ? ? [x]   Previous H&P reviewed, patient examined and there are NO CHANGES              ?  ? []   Previous H&P reviewed, patient examined and there are changes noted. ? ? ? , MD ?3/20/20234:22 PM ? ?  ?

## 2021-10-06 NOTE — H&P (Signed)
[name] is an 61 y.o. female.   ?Chief Complaint: Patient herself has no chief complaint.  Patient is suffering with a psychotic disorder and is disorganized and paranoid.  Poor insight into illness.  Patient is not eating food due to delusional psychosis. ?HPI: History of chronic recurrent schizophrenia ? ?Past Medical History:  ?Diagnosis Date  ? Anemia 10/02/2021  ? Non compliance w medication regimen   ? Schizophrenia (HCC)   ? ? ?Past Surgical History:  ?Procedure Laterality Date  ? BIOPSY  09/25/2021  ? Procedure: BIOPSY;  Surgeon: Rachael Fee, MD;  Location: [facility];  Service: Gastroenterology;;  ? ESOPHAGOGASTRODUODENOSCOPY (EGD) WITH PROPOFOL [name] 09/25/2021  ? Procedure: ESOPHAGOGASTRODUODENOSCOPY (EGD) WITH PROPOFOL;  Surgeon: Rachael Fee, MD;  Location: [facility];  Service: Gastroenterology;  Laterality: [name];  With NGT placement  ? ? ?History reviewed. No pertinent family history. ?Social History:  reports that she has been smoking cigarettes. She has been smoking an average of .5 packs per day. She has never used smokeless tobacco. She reports that she does not currently use alcohol. She reports that she does not use drugs. ? ?Allergies:  ?Allergies  ?Allergen Reactions  ? Aspirin Hives and Other (See Comments)  ?  Possibly caused hives, per sister  ? Ziprasidone Hcl Rash and Other (See Comments)  ?  Caused twitching  ? ? ?Medications Prior to Admission  ?Medication Sig Dispense Refill  ? benztropine (COGENTIN) 0.5 MG tablet Take 1 tablet (0.5 mg total) by mouth 2 (two) times daily as needed for tremors.    ? benztropine mesylate (COGENTIN) 1 MG/ML injection Inject 0.5 mLs (0.5 mg total) into the muscle 2 (two) times daily as needed for tremors. 2 mL   ? clonazePAM (KLONOPIN) 0.5 MG tablet Place 1 tablet (0.5 mg total) into feeding tube 2 (two) times daily. 30 tablet 0  ? mirtazapine (REMERON SOL-TAB) 15 MG disintegrating tablet Place 0.5 tablets (7.5 mg total) into feeding tube at  bedtime. 30 tablet 0  ? Nutritional Supplements (FEEDING SUPPLEMENT, OSMOLITE 1.2 CAL,) LIQD Place 1,000 mLs into feeding tube continuous.  0  ? traZODone (DESYREL) 50 MG tablet Take 1 tablet (50 mg total) by mouth at bedtime as needed for sleep. 30 tablet 0  ? Water For Irrigation, Sterile (FREE WATER) SOLN Place 120 mLs into feeding tube every 6 (six) hours.    ? [EXPIRED] glucagon, human recombinant, (GLUCAGEN) 1 MG injection Inject 0.5 mg into the muscle once for 1 dose. 1 g 0  ? LORazepam (ATIVAN) 2 MG/ML injection Inject 1 mL (2 mg total) into the muscle every 6 (six) hours as needed (agitation). 1 mL 0  ? ? ?Results for orders placed or performed during the hospital encounter of 10/02/21 (from the past 48 hour(s))  ?Comprehensive metabolic panel     Status: Abnormal  ? Collection Time: 10/05/21  6:53 PM  ?Result Value Ref Range  ? Sodium 138 135 - 145 mmol/L  ? Potassium 4.1 3.5 - 5.1 mmol/L  ? Chloride 98 98 - 111 mmol/L  ? CO2 30 22 - 32 mmol/L  ? Glucose, Bld 119 (H) 70 - 99 mg/dL  ?  Comment: Glucose reference range applies only to samples taken after fasting for at least 8 hours.  ? BUN 14 6 - 20 mg/dL  ? Creatinine, Ser 0.61 0.44 - 1.00 mg/dL  ? Calcium 9.0 8.9 - 10.3 mg/dL  ? Total Protein 7.0 6.5 - 8.1 g/dL  ? Albumin 3.2 (L)  3.5 - 5.0 g/dL  ? AST 19 15 - 41 U/L  ? ALT 10 0 - 44 U/L  ? Alkaline Phosphatase 55 38 - 126 U/L  ? Total Bilirubin 0.3 0.3 - 1.2 mg/dL  ? GFR, Estimated >60 >60 mL/min  ?  Comment: (NOTE) ?Calculated using the CKD-EPI Creatinine Equation (2021) ?  ? Anion gap 10 5 - 15  ?  Comment: Performed at [facility], [address], [place], [place] [address]  ?Protime-INR     Status: None  ? Collection Time: 10/06/21  6:20 AM  ?Result Value Ref Range  ? Prothrombin Time 12.8 11.4 - 15.2 seconds  ? INR 1.0 0.8 - 1.2  ?  Comment: (NOTE) ?INR goal varies based on device and disease states. ?Performed at [facility], [address] [place], ?[place] [address] ?  ?CBC      Status: Abnormal  ? Collection Time: 10/06/21  6:20 AM  ?Result Value Ref Range  ? WBC 6.7 4.0 - 10.5 K/uL  ? RBC 4.27 3.87 - 5.11 MIL/uL  ? Hemoglobin 11.9 (L) 12.0 - 15.0 g/dL  ? HCT 36.8 36.0 - 46.0 %  ? MCV 86.2 80.0 - 100.0 fL  ? MCH 27.9 26.0 - 34.0 pg  ? MCHC 32.3 30.0 - 36.0 g/dL  ? RDW 12.7 11.5 - 15.5 %  ? Platelets 285 150 - 400 K/uL  ? nRBC 0.0 0.0 - 0.2 %  ?  Comment: Performed at [facility], [address] [place], [place] [address]  ?Magnesium     Status: None  ? Collection Time: 10/06/21  6:20 AM  ?Result Value Ref Range  ? Magnesium 2.3 1.7 - 2.4 mg/dL  ?  Comment: Performed at [facility], [address] [place], [place] [address]  ?Phosphorus     Status: None  ? Collection Time: 10/06/21  6:20 AM  ?Result Value Ref Range  ? Phosphorus 4.6 2.5 - 4.6 mg/dL  ?  Comment: Performed at [facility], [address], [place], [place] [address]  ? ?No results found. ? ?Review of Systems  ?Unable to perform ROS: Psychiatric disorder  ?Skin: Negative.   ? ?Blood pressure 93/60, pulse 63, temperature 99.3 ?F (37.4 ?C), temperature source Tympanic, resp. rate 18, height 5\' 4"  (1.626 m), weight 43 kg, SpO2 98 %. ?Physical Exam ?Vitals and nursing note reviewed.  ?Constitutional:   ?   Appearance: She is underweight.  ?HENT:  ?   Head: Normocephalic and atraumatic.  ? ?Eyes:  ?   Conjunctiva/sclera: Conjunctivae normal.  ?   Pupils: Pupils are equal, round, and reactive to light.  ?Cardiovascular:  ?   Heart sounds: Normal heart sounds.  ?Pulmonary:  ?   Effort: Pulmonary effort is normal.  ?Abdominal:  ?   Palpations: Abdomen is soft.  ?Musculoskeletal:     ?   General: Normal range of motion.  ?   Cervical back: Normal range of motion.  ?Skin: ?   General: Skin is warm and dry.  ?Psychiatric:     ?   Attention and Perception: She is inattentive.     ?   Mood and Affect: Affect is inappropriate.     ?   Speech: Speech is delayed.     ?   Behavior: Behavior is uncooperative  and slowed.     ?   Thought Content: Thought content is paranoid and delusional.     ?   Cognition and Memory:  Cognition is impaired. Memory is impaired.     ?   Judgment: Judgment is inappropriate.  ?  ? ?Assessment/Plan ?Initiating bilateral ECT today with hope of improving psychotic symptoms as well as improving self-care and nutrition. ? ?Mordecai RasmussenJohn Shuaib Corsino, MD ?10/06/2021, 7:16 AM ? ? ? ?

## 2021-10-07 ENCOUNTER — Other Ambulatory Visit: Payer: Self-pay | Admitting: Psychiatry

## 2021-10-07 LAB — GLUCOSE, CAPILLARY
Glucose-Capillary: 132 mg/dL — ABNORMAL HIGH (ref 70–99)
Glucose-Capillary: 120 mg/dL — ABNORMAL HIGH (ref 70–99)
Glucose-Capillary: 127 mg/dL — ABNORMAL HIGH (ref 70–99)
Glucose-Capillary: 121 mg/dL — ABNORMAL HIGH (ref 70–99)
Glucose-Capillary: 129 mg/dL — ABNORMAL HIGH (ref 70–99)

## 2021-10-07 LAB — PHOSPHORUS: Phosphorus: 3.9 mg/dL (ref 2.5–4.6)

## 2021-10-07 LAB — MAGNESIUM: Magnesium: 2.4 mg/dL (ref 1.7–2.4)

## 2021-10-07 NOTE — Progress Notes (Signed)
Assumed care of pt at 1900. Sleepy but arousable this shift. No c/o pain. NG tube at previous marking in right nare. Tube feedings infusing per MAR. ECT M/W/F. View flowsheets for full assessment. Medication administration per MAR.  ? ?1:1 Sitter maintained overnight. Call bell within reach. Comfort and safety maintained.  ?

## 2021-10-07 NOTE — Progress Notes (Signed)
PT Cancellation Note ? ?Patient Details ?Name: [name] ?MRN: [id number] ?DOB: [birth year] ? ? ?Cancelled Treatment:    Reason Eval/Treat Not Completed: Other (comment).  Chart reviewed and attempted to see pt.  Pt requesting to not be seen at this time as she walked yesterday.  Pt given encouragement to participate, but said she might would attempt later this PM.  Will re-attempt to see pt as medically appropriate at later date/time. ? ? ? ?Nolon Bussing, PT, DPT ?10/07/21, 11:52 AM ? ?

## 2021-10-07 NOTE — Hospital Course (Addendum)
62 year old female with history of paranoid schizophrenia, anemia was initially admitted to [facility] long hospital on 09/12/2021 the patient had a long hospitalization and the decision was made to transfer the patient on 10/02/2021 over at [facility] for ECT treatments for paranoid schizophrenia because she was not eating.  The patient had an NG tube placed.  The patient was treated for electrolyte abnormalities during the hospital course.  The patient had her first ECT treatment treatment on 10/06/2021.  We will have ECT treated treatments on Monday, Wednesday and Friday. ?3/25.  Patient condition much improved, she has good appetite, NG tube was pulled out 2 days ago. ?3/26.  Patient had a episode of ventricular tachycardia during the ECT.  Cardiology consult obtained ?

## 2021-10-07 NOTE — Progress Notes (Signed)
Physical Therapy Treatment ?Patient Details ?Name: [name] ?MRN: [id number] ?DOB: 1961/07/10 ?Today's Date: 10/07/2021 ? ? ?History of Present Illness [name] is a 60yoF who comes to [facility] direct transfer from [facility] for ECT therapy. At home pt was experiencing hallucinations and delusional thoughts, known history of paranoid schizophrenia and recent medication noncompliance. Pt was not eating or drinking at home. ? ?  ?PT Comments  ? ? Pt received supine in bed upon arrival to room and agreeable to therapy.  Pt continues to be performing well with bed mobility and does not require any assistance with mobility.  Pt given CGA due to instability to the L at times, but noting only 2 episodes of veering to the L, which she is able to correct without assistance from the therapist.  Pt then transferred back to sitting EOB, where she requested to stay for a while.  Sitter in room with bed alarm set.  Current discharge plans to home with no PT follow-up remain appropriate at this time.  Pt will continue to benefit from skilled therapy in order to address deficits listed below. ?  ?Recommendations for follow up therapy are one component of a multi-disciplinary discharge planning process, led by the attending physician.  Recommendations may be updated based on patient status, additional functional criteria and insurance authorization. ? ?Follow Up Recommendations ? No PT follow up ?  ?  ?Assistance Recommended at Discharge Intermittent Supervision/Assistance  ?Patient can return home with the following A little help with walking and/or transfers;Assistance with cooking/housework;Help with stairs or ramp for entrance ?  ?Equipment Recommendations ?    ?  ?Recommendations for Other Services   ? ? ?  ?Precautions / Restrictions Precautions ?Precautions: Fall ?Precaution Comments: NGT ?Restrictions ?Weight Bearing Restrictions: No  ?  ? ?Mobility ? Bed Mobility ?Overal bed mobility: Independent ?  ?  ?  ?  ?  ?  ?  ?   ? ?Transfers ?Overall transfer level: Independent ?Equipment used: None ?Transfers: Sit to/from Stand ?Sit to Stand: Independent, From elevated surface, Supervision ?  ?  ?  ?  ?  ?  ?  ? ?Ambulation/Gait ?Ambulation/Gait assistance: Min guard ?Gait Distance (Feet): 320 Feet ?Assistive device: None ?  ?Gait velocity: decreased ?  ?  ?  ? ? ?Stairs ?  ?  ?  ?  ?  ? ? ?Wheelchair Mobility ?  ? ?Modified Rankin (Stroke Patients Only) ?  ? ? ?  ?Balance Overall balance assessment: Mild deficits observed, not formally tested ?  ?  ?  ?  ?  ?  ?  ?  ?  ?  ?  ?  ?  ?  ?  ?  ?  ?  ?  ? ?  ?Cognition Arousal/Alertness: Awake/alert ?Behavior During Therapy: [facility] for tasks assessed/performed ?Overall Cognitive Status: Within Functional Limits for tasks assessed ?  ?  ?  ?  ?  ?  ?  ?  ?  ?  ?  ?  ?  ?  ?  ?  ?General Comments: pleasant and participatory ?  ?  ? ?  ?Exercises   ? ?  ?General Comments   ?  ?  ? ?Pertinent Vitals/Pain Pain Assessment ?Pain Assessment: No/denies pain  ? ? ?Home Living   ?  ?  ?  ?  ?  ?  ?  ?  ?  ?   ?  ?Prior Function    ?  ?  ?   ? ?  PT Goals (current goals can now be found in the care plan section) Progress towards PT goals: Progressing toward goals ? ?  ?Frequency ? ? ? Min 2X/week ? ? ? ?  ?PT Plan Current plan remains appropriate  ? ? ?Co-evaluation   ?  ?  ?  ?  ? ?  ?AM-PAC PT "6 Clicks" Mobility   ?Outcome Measure ? Help needed turning from your back to your side while in a flat bed without using bedrails?: None ?Help needed moving from lying on your back to sitting on the side of a flat bed without using bedrails?: None ?Help needed moving to and from a bed to a chair (including a wheelchair)?: None ?Help needed standing up from a chair using your arms (e.g., wheelchair or bedside chair)?: None ?Help needed to walk in hospital room?: A Little ?Help needed climbing 3-5 steps with a railing? : A Little ?6 Click Score: 22 ? ?  ?End of Session Equipment Utilized During Treatment: Gait  belt ?Activity Tolerance: Patient tolerated treatment well;No increased pain ?Patient left: with nursing/sitter in room;with call bell/phone within reach;in bed (seated EOB) ?Nurse Communication: Mobility status ?PT Visit Diagnosis: Difficulty in walking, not elsewhere classified (R26.2);Unsteadiness on feet (R26.81);Muscle weakness (generalized) (M62.81) ?  ? ? ?Time: 2703-5009 ?PT Time Calculation (min) (ACUTE ONLY): 13 min ? ?Charges:  $Gait Training: 8-22 mins          ?          ? ?Nolon Bussing, PT, DPT ?10/07/21, 4:24 PM ? ? ? ?Phineas Real ?10/07/2021, 4:22 PM ? ?

## 2021-10-07 NOTE — Progress Notes (Signed)
Offered ?Progress Note ? ? ?Patient: [name] QZR:[id number] DOB: 05-04-1961 DOA: 10/02/2021     5 ?DOS: the patient was seen and examined on 10/07/2021 ?  ?Brief hospital course: ?62 year old female with history of paranoid schizophrenia, anemia was initially admitted to [facility] long hospital on 09/12/2021 the patient had a long hospitalization and the decision was made to transfer the patient on 10/02/2021 over at [facility] for ECT treatments for paranoid schizophrenia because she was not eating.  The patient had an NG tube placed.  The patient was treated for electrolyte abnormalities during the hospital course.  The patient had her first ECT treatment treatment on 10/06/2021.  We will have ECT treated treatments on Monday, Wednesday and Friday. ? ?Assessment and Plan: ?* Schizophrenia, paranoid (HCC) ?First ECT treatment on 10/08/2021.  Continue ECT treatments Monday Wednesday Friday and holding tube feeds prior.  Continue Haldol down NG tube.  Her main issue as she is not eating. ? ?Sinus bradycardia ?Bradycardia while sleeping on the first night in the hospital.  Heart rates have been in the 50s and 60s. ? ?Hypokalemia, hypomagnesemia and hypophosphatemia ?Replaced.  Continue tube feedings. ? ? ?Protein-calorie malnutrition, severe (HCC) ?Continue tube feedings until she eats on her own.  May end up needing a PEG tube ? ? ?Anemia of chronic disease ?Last hemoglobin 11.9.  With ferritin being above 100 less likely iron deficiency anemia.  TIBC also low. ? ? ? ? ?  ? ?Subjective: Patient answers some simple yes/no questions.  No complaints.  Transferred over from [facility] for ECT treatments for paranoid schizophrenia and not eating. ? ?Physical Exam: ?Vitals:  ? 10/06/21 2330 10/07/21 0407 10/07/21 0805 10/07/21 1234  ?BP: 114/63 (!) 122/56 116/61 (!) 109/57  ?Pulse: 67 68 69 68  ?Resp: 18 16    ?Temp: 98.3 ?F (36.8 ?C) 97.6 ?F (36.4 ?C) 98.2 ?F (36.8 ?C) 98.3 ?F (36.8 ?C)  ?TempSrc: Oral   Oral Oral  ?SpO2: 99% 100% 100% 100%  ?Weight:  44.8 kg    ?Height:      ? ?Physical Exam ?HENT:  ?   Head: Normocephalic.  ?   Mouth/Throat:  ?   Pharynx: No oropharyngeal exudate.  ?Eyes:  ?   General: Lids are normal.  ?   Conjunctiva/sclera: Conjunctivae normal.  ?Cardiovascular:  ?   Rate and Rhythm: Normal rate and regular rhythm.  ?   Heart sounds: Normal heart sounds, S1 normal and S2 normal.  ?Pulmonary:  ?   Breath sounds: No decreased breath sounds, wheezing, rhonchi or rales.  ?Abdominal:  ?   Palpations: Abdomen is soft.  ?   Tenderness: There is no abdominal tenderness.  ?Musculoskeletal:  ?   Right lower leg: No swelling.  ?   Left lower leg: No swelling.  ?Skin: ?   General: Skin is warm.  ?   Findings: No rash.  ?Neurological:  ?   Mental Status: She is alert.  ?   Comments: Answers questions appropriately.  ?  ?Data Reviewed: ?Last phosphorus and magnesium normal range. ? ?Family Communication: Updated patient's sister yesterday and the legal guardian. ? ?Disposition: ?Status is: Inpatient ?Remains inpatient appropriate because: Patient is not eating and has NG tube in.  Receiving ECT treatments for paranoid schizophrenia ? ?Planned Discharge Destination: Home ? ?Author: ?Alford Highland, MD ?10/07/2021 3:32 PM ? ?For on call review [email or web address].  ?

## 2021-10-08 ENCOUNTER — Inpatient Hospital Stay: Payer: Medicare Other | Admitting: Anesthesiology

## 2021-10-08 ENCOUNTER — Encounter: Payer: Self-pay | Admitting: Internal Medicine

## 2021-10-08 ENCOUNTER — Inpatient Hospital Stay: Payer: Medicare Other

## 2021-10-08 DIAGNOSIS — R4182 Altered mental status, unspecified: Secondary | ICD-10-CM | POA: Diagnosis not present

## 2021-10-08 DIAGNOSIS — F200 Paranoid schizophrenia: Secondary | ICD-10-CM | POA: Diagnosis not present

## 2021-10-08 LAB — GLUCOSE, CAPILLARY
Glucose-Capillary: 102 mg/dL — ABNORMAL HIGH (ref 70–99)
Glucose-Capillary: 102 mg/dL — ABNORMAL HIGH (ref 70–99)
Glucose-Capillary: 138 mg/dL — ABNORMAL HIGH (ref 70–99)
Glucose-Capillary: 140 mg/dL — ABNORMAL HIGH (ref 70–99)
Glucose-Capillary: 99 mg/dL (ref 70–99)

## 2021-10-08 MED ORDER — SODIUM CHLORIDE 0.9 % IV SOLN: 500.0000 mL | Freq: Once | INTRAVENOUS | Status: AC

## 2021-10-08 MED ORDER — SUCCINYLCHOLINE CHLORIDE 200 MG/10ML IV SOSY
PREFILLED_SYRINGE | INTRAVENOUS | Status: DC | PRN
  Administered 2021-10-08: 60 mg via INTRAVENOUS

## 2021-10-08 MED ORDER — METHOHEXITAL SODIUM 100 MG/10ML IV SOSY
PREFILLED_SYRINGE | INTRAVENOUS | Status: DC | PRN
Start: 2021-10-08 — End: 2021-10-08
  Administered 2021-10-08: 40 mg via INTRAVENOUS

## 2021-10-08 NOTE — Anesthesia Postprocedure Evaluation (Signed)
Anesthesia Post Note ? ?Patient: [name] ? ?Procedure(s) Performed: ECT TX ? ?Patient location during evaluation: PACU ?Anesthesia Type: General ?Level of consciousness: awake and alert, oriented and patient cooperative ?Pain management: pain level controlled ?Vital Signs Assessment: post-procedure vital signs reviewed and stable ?Respiratory status: spontaneous breathing, nonlabored ventilation and respiratory function stable ?Cardiovascular status: blood pressure returned to baseline and stable ?Postop Assessment: adequate PO intake ?Anesthetic complications: no ? ? ?No notable events documented. ? ? ?Last Vitals:  ?Vitals:  ? 10/08/21 1404 10/08/21 1411  ?BP:  (!) 104/58  ?Pulse: 76 68  ?Resp: 19 16  ?Temp:  37.4 ?C  ?SpO2: 97% 97%  ?  ?Last Pain:  ?Vitals:  ? 10/08/21 1411  ?TempSrc:   ?PainSc: Asleep  ? ? ?LLE Motor Response: Purposeful movement (10/08/21 1441) ?  ?RLE Motor Response: Purposeful movement (10/08/21 1441) ?  ?  ?  ? ?[name] ? ? ? ? ?

## 2021-10-08 NOTE — Progress Notes (Signed)
?  Progress Note ? ? ?Patient: [name] MPN:[id number] DOB: [birth year] DOA: 10/02/2021     6 ?DOS: the patient was seen and examined on 10/08/2021 ?  ?Brief hospital course: ?61 year old female with history of paranoid schizophrenia, anemia was initially admitted to [facility] long hospital on 09/12/2021 the patient had a long hospitalization and the decision was made to transfer the patient on 10/02/2021 over at [facility] for ECT treatments for paranoid schizophrenia because she was not eating.  The patient had an NG tube placed.  The patient was treated for electrolyte abnormalities during the hospital course.  The patient had her first ECT treatment treatment on 10/06/2021.  We will have ECT treated treatments on Monday, Wednesday and Friday. ? ?Assessment and Plan: ?* Schizophrenia, paranoid (HCC) ?First ECT treatment on 10/08/2021.  Continue ECT treatments Monday Wednesday Friday per psychiatry. ? ?Sinus bradycardia ?Condition has resolved. ? ?Hypokalemia, hypomagnesemia and hypophosphatemia ?Recheck labs tomorrow. ? ? ?Protein-calorie malnutrition, severe (HCC) ?Continue tube feeding. ? ? ?Anemia of chronic disease ?Condition stable, recheck a CBC tomorrow. ? ? ? ? ?  ? ?Subjective:  ?Patient doing well today, states that that she is not hungry, but will eat if give her food.  she is also tolerating tube feeding. ? ?Physical Exam: ?Vitals:  ? 10/08/21 1345 10/08/21 1400 10/08/21 1404 10/08/21 1411  ?BP: 112/62 112/68  (!) 104/58  ?Pulse: 82 71 76 68  ?Resp: 17 14 19 16   ?Temp:    99.3 ?F (37.4 ?C)  ?TempSrc:      ?SpO2: 100% 97% 97% 97%  ?Weight:      ?Height:      ? ?General exam: Appears calm and comfortable  ?Respiratory system: Clear to auscultation. Respiratory effort normal. ?Cardiovascular system: S1 & S2 heard, RRR. No JVD, murmurs, rubs, gallops or clicks. No pedal edema. ?Gastrointestinal system: Abdomen is nondistended, soft and nontender. No organomegaly or masses felt. Normal bowel  sounds heard. ?Central nervous system: Alert and oriented. No focal neurological deficits. ?Extremities: Symmetric 5 x 5 power. ?Skin: No rashes, lesions or ulcers ?Psychiatry: Judgement and insight appear normal. Mood & affect appropriate.  ? ?Data Reviewed: ? ?No new labs, reviewed the glucoses.  ? ?Family Communication:  ? ?Disposition: ?Status is: Inpatient ?Remains inpatient appropriate because: Severity of disease, inpatient procedure ? Planned Discharge Destination: Home ? ? ? ?Time spent: 26 minutes ? ?Author: ? , MD ?10/08/2021 2:25 PM ? ?For on call review [email or web address].  ?

## 2021-10-08 NOTE — Progress Notes (Signed)
PT Cancellation Note ? ?Patient Details ?Name: [name] ?MRN: [id number] ?DOB: October 22, 1960 ? ? ?Cancelled Treatment:     PT attempt. Pt off floor having ECT treatment. Acute therapy will continue to follow and progress as able per current POC.  ? ? ?Rushie Chestnut ?10/08/2021, 1:46 PM ?

## 2021-10-08 NOTE — Progress Notes (Signed)
Assumed care of pt at 1900. Fatigue but easily arousable this shift. No c/o pain. NG tube in right nare at previous marking. Ongoing placement verification. Tube feeding held at MN per orders for ECT 3/22. Medication administration per MAR. Full assessment and vitals per flowsheets. Call bell within reach. 1:1 safety sitter maintained overnight.  ?

## 2021-10-08 NOTE — H&P (Signed)
[name] is an 61 y.o. female.   ?Chief Complaint: Patient seen for second ECT treatment today.  Continues to not eat anything.  Continues to be somewhat irritable and paranoid ?HPI: History of chronic schizophrenia ? ?Past Medical History:  ?Diagnosis Date  ? Anemia 10/02/2021  ? Non compliance w medication regimen   ? Schizophrenia (HCC)   ? ? ?Past Surgical History:  ?Procedure Laterality Date  ? BIOPSY  09/25/2021  ? Procedure: BIOPSY;  Surgeon: Rachael Fee, MD;  Location: [facility];  Service: Gastroenterology;;  ? ESOPHAGOGASTRODUODENOSCOPY (EGD) WITH PROPOFOL [name] 09/25/2021  ? Procedure: ESOPHAGOGASTRODUODENOSCOPY (EGD) WITH PROPOFOL;  Surgeon: Rachael Fee, MD;  Location: [facility];  Service: Gastroenterology;  Laterality: [name];  With NGT placement  ? ? ?History reviewed. No pertinent family history. ?Social History:  reports that she has been smoking cigarettes. She has been smoking an average of .5 packs per day. She has never used smokeless tobacco. She reports that she does not currently use alcohol. She reports that she does not use drugs. ? ?Allergies:  ?Allergies  ?Allergen Reactions  ? Aspirin Hives and Other (See Comments)  ?  Possibly caused hives, per sister  ? Ziprasidone Hcl Rash and Other (See Comments)  ?  Caused twitching  ? ? ?Medications Prior to Admission  ?Medication Sig Dispense Refill  ? benztropine (COGENTIN) 0.5 MG tablet Take 1 tablet (0.5 mg total) by mouth 2 (two) times daily as needed for tremors.    ? benztropine mesylate (COGENTIN) 1 MG/ML injection Inject 0.5 mLs (0.5 mg total) into the muscle 2 (two) times daily as needed for tremors. 2 mL   ? clonazePAM (KLONOPIN) 0.5 MG tablet Place 1 tablet (0.5 mg total) into feeding tube 2 (two) times daily. 30 tablet 0  ? mirtazapine (REMERON SOL-TAB) 15 MG disintegrating tablet Place 0.5 tablets (7.5 mg total) into feeding tube at bedtime. 30 tablet 0  ? Nutritional Supplements (FEEDING SUPPLEMENT, OSMOLITE 1.2 CAL,) LIQD  Place 1,000 mLs into feeding tube continuous.  0  ? traZODone (DESYREL) 50 MG tablet Take 1 tablet (50 mg total) by mouth at bedtime as needed for sleep. 30 tablet 0  ? Water For Irrigation, Sterile (FREE WATER) SOLN Place 120 mLs into feeding tube every 6 (six) hours.    ? [EXPIRED] glucagon, human recombinant, (GLUCAGEN) 1 MG injection Inject 0.5 mg into the muscle once for 1 dose. 1 g 0  ? LORazepam (ATIVAN) 2 MG/ML injection Inject 1 mL (2 mg total) into the muscle every 6 (six) hours as needed (agitation). 1 mL 0  ? ? ?Results for orders placed or performed during the hospital encounter of 10/02/21 (from the past 48 hour(s))  ?Glucose, capillary     Status: Abnormal  ? Collection Time: 10/06/21  7:52 PM  ?Result Value Ref Range  ? Glucose-Capillary 144 (H) 70 - 99 mg/dL  ?  Comment: Glucose reference range applies only to samples taken after fasting for at least 8 hours.  ?Glucose, capillary     Status: Abnormal  ? Collection Time: 10/06/21 11:32 PM  ?Result Value Ref Range  ? Glucose-Capillary 121 (H) 70 - 99 mg/dL  ?  Comment: Glucose reference range applies only to samples taken after fasting for at least 8 hours.  ?Glucose, capillary     Status: Abnormal  ? Collection Time: 10/07/21  4:07 AM  ?Result Value Ref Range  ? Glucose-Capillary 132 (H) 70 - 99 mg/dL  ?  Comment: Glucose reference range  applies only to samples taken after fasting for at least 8 hours.  ?Magnesium     Status: None  ? Collection Time: 10/07/21  4:34 AM  ?Result Value Ref Range  ? Magnesium 2.4 1.7 - 2.4 mg/dL  ?  Comment: Performed at [facility], [address], [place], [place] [address]  ?Phosphorus     Status: None  ? Collection Time: 10/07/21  4:34 AM  ?Result Value Ref Range  ? Phosphorus 3.9 2.5 - 4.6 mg/dL  ?  Comment: Performed at [facility], [address], [place], [place] [address]  ?Glucose, capillary     Status: Abnormal  ? Collection Time: 10/07/21  7:52 AM  ?Result Value Ref Range  ?  Glucose-Capillary 120 (H) 70 - 99 mg/dL  ?  Comment: Glucose reference range applies only to samples taken after fasting for at least 8 hours.  ?Glucose, capillary     Status: Abnormal  ? Collection Time: 10/07/21 12:31 PM  ?Result Value Ref Range  ? Glucose-Capillary 127 (H) 70 - 99 mg/dL  ?  Comment: Glucose reference range applies only to samples taken after fasting for at least 8 hours.  ?Glucose, capillary     Status: Abnormal  ? Collection Time: 10/07/21  3:59 PM  ?Result Value Ref Range  ? Glucose-Capillary 121 (H) 70 - 99 mg/dL  ?  Comment: Glucose reference range applies only to samples taken after fasting for at least 8 hours.  ?Glucose, capillary     Status: Abnormal  ? Collection Time: 10/07/21  7:38 PM  ?Result Value Ref Range  ? Glucose-Capillary 129 (H) 70 - 99 mg/dL  ?  Comment: Glucose reference range applies only to samples taken after fasting for at least 8 hours.  ?Glucose, capillary     Status: Abnormal  ? Collection Time: 10/08/21 12:12 AM  ?Result Value Ref Range  ? Glucose-Capillary 140 (H) 70 - 99 mg/dL  ?  Comment: Glucose reference range applies only to samples taken after fasting for at least 8 hours.  ?Glucose, capillary     Status: Abnormal  ? Collection Time: 10/08/21  3:46 AM  ?Result Value Ref Range  ? Glucose-Capillary 102 (H) 70 - 99 mg/dL  ?  Comment: Glucose reference range applies only to samples taken after fasting for at least 8 hours.  ?Glucose, capillary     Status: Abnormal  ? Collection Time: 10/08/21  8:53 AM  ?Result Value Ref Range  ? Glucose-Capillary 102 (H) 70 - 99 mg/dL  ?  Comment: Glucose reference range applies only to samples taken after fasting for at least 8 hours.  ?Glucose, capillary     Status: None  ? Collection Time: 10/08/21  2:34 PM  ?Result Value Ref Range  ? Glucose-Capillary 99 70 - 99 mg/dL  ?  Comment: Glucose reference range applies only to samples taken after fasting for at least 8 hours.  ?Glucose, capillary     Status: Abnormal  ? Collection  Time: 10/08/21  4:50 PM  ?Result Value Ref Range  ? Glucose-Capillary 138 (H) 70 - 99 mg/dL  ?  Comment: Glucose reference range applies only to samples taken after fasting for at least 8 hours.  ? ?No results found. ? ?Review of Systems  ?Constitutional: Negative.   ?HENT: Negative.    ?Eyes: Negative.   ?Respiratory: Negative.    ?Cardiovascular: Negative.   ?Gastrointestinal: Negative.   ?Musculoskeletal: Negative.   ?Skin: Negative.   ?Neurological: Negative.   ?Psychiatric/Behavioral:  Positive for behavioral problems, confusion and dysphoric mood. Negative for agitation and decreased concentration.   ? ?Blood pressure 95/61, pulse 74, temperature 98.8 ?F (37.1 ?C), resp. rate 18, height 5\' 4"  (1.626 m), weight 45.3 kg, SpO2 96 %. ?Physical Exam ?Constitutional:   ?   Appearance: She is well-developed.  ?HENT:  ?   Head: Normocephalic and atraumatic.  ?Eyes:  ?   Conjunctiva/sclera: Conjunctivae normal.  ?   Pupils: Pupils are equal, round, and reactive to light.  ?Cardiovascular:  ?   Heart sounds: Normal heart sounds.  ?Pulmonary:  ?   Effort: Pulmonary effort is normal.  ?Abdominal:  ?   Palpations: Abdomen is soft.  ?Musculoskeletal:     ?   General: Normal range of motion.  ?   Cervical back: Normal range of motion.  ?Skin: ?   General: Skin is warm and dry.  ?Neurological:  ?   General: No focal deficit present.  ?   Mental Status: She is alert.  ?Psychiatric:     ?   Attention and Perception: She is inattentive.     ?   Mood and Affect: Affect is blunt.     ?   Speech: Speech is delayed.     ?   Behavior: Behavior is slowed.     ?   Thought Content: Thought content is paranoid.     ?   Cognition and Memory: Cognition is impaired.  ?  ? ?Assessment/Plan ?Tolerated first treatment well.  Second ECT treatment today.  Continue ongoing assessment with hope that we will see some clear improvement by the end of the week. ? ?Mordecai RasmussenJohn Kippy Gohman, MD ?10/08/2021, 5:12 PM ? ? ? ?

## 2021-10-08 NOTE — Anesthesia Preprocedure Evaluation (Addendum)
Anesthesia Evaluation  ?Patient identified by MRN, date of birth, ID band ?Patient awake ? ? ? ?Reviewed: ?Allergy & Precautions, NPO status , Patient's Chart, lab work & pertinent test results ? ?History of Anesthesia Complications ?Negative for: history of anesthetic complications ? ?Airway ?Mallampati: IV ? ? ?Neck ROM: Full ? ? ? Dental ? ? ?Missing upper teeth x2:   ?Pulmonary ?Current Smoker and Patient abstained from smoking.,  ?  ?Pulmonary exam normal ?breath sounds clear to auscultation ? ? ? ? ? ? Cardiovascular ?Normal cardiovascular exam ?Rhythm:Regular Rate:Normal ? ?ECG 10/02/21:  ?Normal sinus rhythm ?T wave abnormality, consider anterolateral ischemia ?  ?Neuro/Psych ?PSYCHIATRIC DISORDERS Schizophrenia negative neurological ROS ?   ? GI/Hepatic ?negative GI ROS,   ?Endo/Other  ?negative endocrine ROS ? Renal/GU ?negative Renal ROS  ? ?  ?Musculoskeletal ? ? Abdominal ?  ?Peds ? Hematology ? ?(+) Blood dyscrasia, anemia ,   ?Anesthesia Other Findings ? ? Reproductive/Obstetrics ? ?  ? ? ? ? ? ? ? ? ? ? ? ? ? ?  ?  ? ? ? ? ? ? ? ?Anesthesia Physical ?Anesthesia Plan ? ?ASA: 3 ? ?Anesthesia Plan: General  ? ?Post-op Pain Management:   ? ?Induction: Intravenous ? ?PONV Risk Score and Plan: 2 and TIVA and Treatment may vary due to age or medical condition ? ?Airway Management Planned: Natural Airway ? ?Additional Equipment:  ? ?Intra-op Plan:  ? ?Post-operative Plan:  ? ?Informed Consent: I have reviewed the patients History and Physical, chart, labs and discussed the procedure including the risks, benefits and alternatives for the proposed anesthesia with the patient or authorized representative who has indicated his/her understanding and acceptance.  ? ? ? ? ? ?Plan Discussed with: CRNA ? ?Anesthesia Plan Comments: (LMA/GETA backup discussed.  Serial consent on chart.  Patient consented for risks of anesthesia including but not limited to:  ?- adverse reactions to  medications ?- damage to eyes, teeth, lips or other oral mucosa ?- nerve damage due to positioning  ?- sore throat or hoarseness ?- damage to heart, brain, nerves, lungs, other parts of body or loss of life ? ?Informed patient about role of CRNA in peri- and intra-operative care.  Patient voiced understanding.)  ? ? ? ? ? ?Anesthesia Quick Evaluation ? ?

## 2021-10-08 NOTE — Progress Notes (Signed)
Nutrition Follow-up ? ?DOCUMENTATION CODES:  ? ?Underweight, Severe malnutrition in context of chronic illness ? ?INTERVENTION:  ? ?-Continue TF via NGT:  ? ?Osmolite 1.5 @ 85 ml/hr  ?  ?45 ml Prosource TF TID.   ?  ?200 ml free water flush every 4 hours ?  ?Tube feeding regimen provides 3180 kcal, 161 grams of protein, and 1554 ml of H2O.  Total free water: 2754 ml daily ?  ?TF will be held Monday, Wednesday, and Friday due to ECT treatments. On average, TF regimen will provide 1817 kcals, 92 grams protein, and 888 ml free water (1573 ml daily). ? ? ?NUTRITION DIAGNOSIS:  ? ?Severe Malnutrition related to chronic illness (paranoid schizophrenia) as evidenced by percent weight loss, moderate fat depletion, severe fat depletion, moderate muscle depletion, severe muscle depletion. ? ?Ongoing ? ?GOAL:  ? ?Patient will meet greater than or equal to 90% of their needs ? ?Met with TF ? ?MONITOR:  ? ?Diet advancement, Labs, Weight trends, TF tolerance, Skin, I & O's ? ?REASON FOR ASSESSMENT:  ? ?Malnutrition Screening Tool ?  ? ?ASSESSMENT:  ? ?[name] is a 61 y.o. female with medical history significant of paranoid schizophrenia presented to the emergency room on 09/12/21 brought by family for not 8 drinking and eating and becoming hypoglycemic.  Patient is not taking her meds and is having hallucinations and delusional thoughts. ? ?3/9- NGT placed ?3/18- s/p BSE- refused all PO intake ?3/20- ECT treatments initiated ?  ?Reviewed I/O's: +1.9 L x 24 hours and +7 L since admission ? ?Pt out of room at time of visit. TF currently held today 2/2 ECT treatments. Per MD, plan to continue TF until pt is able to eat on her won. Pt may require PEG. TF were adjusted on 10/06/21 to account for TF being held secondary to ECT.  ?  ?Medications reviewed and include remeron and thiamine.  ? ?Labs reviewed: CBGS: 102-140 (inpatient orders for glycemic control are none).   ? ?Diet Order:   ?Diet Order   ? ?       ?  Diet NPO time  specified  Diet effective midnight       ?  ?  Diet NPO time specified  Diet effective midnight       ?  ? ?  ?  ? ?  ? ? ?EDUCATION NEEDS:  ? ?Education needs have been addressed ? ?Skin:  Skin Assessment: Reviewed RN Assessment ? ?Last BM:  10/07/21 ? ?Height:  ? ?Ht Readings from Last 1 Encounters:  ?10/08/21 $RemoveBe'5\' 4"'[id number]$  (1.626 m)  ? ? ?Weight:  ? ?Wt Readings from Last 1 Encounters:  ?10/08/21 45.3 kg  ? ? ?Ideal Body Weight:  54.5 kg ? ?BMI:  Body mass index is 17.14 kg/m?. ? ?Estimated Nutritional Needs:  ? ?Kcal:  1750-1950 ? ?Protein:  90-105 grams ? ?Fluid:  > 1.7 L ? ? ? ?Loistine Chance, RD, LDN, CDCES ?Registered Dietitian II ?Certified Diabetes Care and Education Specialist ?Please refer to [facility] for RD and/or RD on-call/weekend/after hours pager  ?

## 2021-10-08 NOTE — Transfer of Care (Signed)
Immediate Anesthesia Transfer of Care Note ? ?Patient: [name] ? ?Procedure(s) Performed: ECT TX ? ?Patient Location: PACU ? ?Anesthesia Type:General ? ?Level of Consciousness: awake, alert  and oriented ? ?Airway & Oxygen Therapy: Patient Spontanous Breathing and Patient connected to face mask oxygen ? ?Post-op Assessment: Report given to RN and Post -op Vital signs reviewed and stable ? ?Post vital signs: Reviewed and stable ? ?Last Vitals:  ?Vitals Value Taken Time  ?BP 120/69 10/08/21 1339  ?Temp    ?Pulse 81 10/08/21 1340  ?Resp 21 10/08/21 1340  ?SpO2 98 % 10/08/21 1340  ?Vitals shown include unvalidated device data. ? ?Last Pain:  ?Vitals:  ? 10/08/21 1030  ?TempSrc: Tympanic  ?PainSc: 0-No pain  ?   ? ?  ? ?Complications: No notable events documented. ?

## 2021-10-08 NOTE — Procedures (Signed)
ECT SERVICES ?Physician?s Interval Evaluation & Treatment Note ? ?Patient Identification: [name] ?MRN:  [id number] ?Date of Evaluation:  10/08/2021 ?[place] #: 2 ? ?MADRS:  ? ?MMSE:  ? ?P.E. Findings: ? ?Patient is thin has lost a lot of weight.  No worse than last time ? ?Psychiatric Interval Note: ? ?Still withdrawn and blunted ? ?Subjective: ? ?Patient is a 61 y.o. female seen for evaluation for Electroconvulsive Therapy. ?No specific complaint but still irritable towards her family ? ?Treatment Summary: ? ? []   Right Unilateral             [x]  Bilateral ?  ?% Energy : 1.0 ms 35% ? ? Impedance: 1480 ohms ? ?Seizure Energy Index: 13,958 ?V squared ? ?Postictal Suppression Index: 76% ? ?Seizure Concordance Index: 97% ? ?Medications ? ?Pre Shock: Brevital 40 mg succinylcholine 60 mg ? ?Post Shock:   ? ?Seizure Duration: 32 seconds EMG 40 seconds EEG ? ? ?Comments: ?Next treatment Friday. ? ?Lungs: ? ?[x]   Clear to auscultation              ? ?[]  Other:  ? ?Heart: ?  ? [x]   Regular rhythm             []  irregular rhythm ? ? ? [x]   Previous H&P reviewed, patient examined and there are NO CHANGES              ?  ? []   Previous H&P reviewed, patient examined and there are changes noted. ? ? ?Alethia Berthold, MD ?3/22/20235:13 PM ? ?  ?

## 2021-10-08 NOTE — Progress Notes (Signed)
Mobility Specialist - Progress Note ? ? 10/08/21 1349  ?Mobility  ?Activity Refused mobility  ? ? ? ?Pt off unit for ECT at this time. Will attempt another date.  ? ? ?Filiberto Pinks ?Mobility Specialist ?10/08/21, 1:51 PM ? ? ? ?

## 2021-10-09 ENCOUNTER — Other Ambulatory Visit: Payer: Self-pay | Admitting: Psychiatry

## 2021-10-09 DIAGNOSIS — I9589 Other hypotension: Secondary | ICD-10-CM

## 2021-10-09 LAB — BASIC METABOLIC PANEL
Anion gap: 8 (ref 5–15)
BUN: 22 mg/dL — ABNORMAL HIGH (ref 6–20)
CO2: 33 mmol/L — ABNORMAL HIGH (ref 22–32)
Calcium: 9.3 mg/dL (ref 8.9–10.3)
Chloride: 95 mmol/L — ABNORMAL LOW (ref 98–111)
Creatinine, Ser: 0.69 mg/dL (ref 0.44–1.00)
GFR, Estimated: >60 mL/min (ref >60)
Glucose, Bld: 108 mg/dL — ABNORMAL HIGH (ref 70–99)
Potassium: 4.2 mmol/L (ref 3.5–5.1)
Sodium: 136 mmol/L (ref 135–145)

## 2021-10-09 LAB — CBC WITH DIFFERENTIAL/PLATELET
Abs Immature Granulocytes: 0.01 10*3/uL (ref 0.00–0.07)
Basophils Absolute: 0 10*3/uL (ref 0.0–0.1)
Basophils Relative: 0 %
Eosinophils Absolute: 0.2 10*3/uL (ref 0.0–0.5)
Eosinophils Relative: 3 %
HCT: 36.3 % (ref 36.0–46.0)
Hemoglobin: 11.5 g/dL — ABNORMAL LOW (ref 12.0–15.0)
Immature Granulocytes: 0 %
Lymphocytes Relative: 17 %
Lymphs Abs: 1.2 10*3/uL (ref 0.7–4.0)
MCH: 27.8 pg (ref 26.0–34.0)
MCHC: 31.7 g/dL (ref 30.0–36.0)
MCV: 87.9 fL (ref 80.0–100.0)
Monocytes Absolute: 0.4 10*3/uL (ref 0.1–1.0)
Monocytes Relative: 6 %
Neutro Abs: 5.2 10*3/uL (ref 1.7–7.7)
Neutrophils Relative %: 74 %
Platelets: 302 10*3/uL (ref 150–400)
RBC: 4.13 MIL/uL (ref 3.87–5.11)
RDW: 13 % (ref 11.5–15.5)
WBC: 7 10*3/uL (ref 4.0–10.5)
nRBC: 0 % (ref 0.0–0.2)

## 2021-10-09 LAB — GLUCOSE, CAPILLARY
Glucose-Capillary: 106 mg/dL — ABNORMAL HIGH (ref 70–99)
Glucose-Capillary: 109 mg/dL — ABNORMAL HIGH (ref 70–99)
Glucose-Capillary: 112 mg/dL — ABNORMAL HIGH (ref 70–99)
Glucose-Capillary: 112 mg/dL — ABNORMAL HIGH (ref 70–99)
Glucose-Capillary: 114 mg/dL — ABNORMAL HIGH (ref 70–99)
Glucose-Capillary: 122 mg/dL — ABNORMAL HIGH (ref 70–99)

## 2021-10-09 LAB — MAGNESIUM: Magnesium: 2.5 mg/dL — ABNORMAL HIGH (ref 1.7–2.4)

## 2021-10-09 LAB — PHOSPHORUS: Phosphorus: 4.5 mg/dL (ref 2.5–4.6)

## 2021-10-09 MED ORDER — MIDODRINE HCL 5 MG PO TABS
2.5000 mg | ORAL_TABLET | Freq: Three times a day (TID) | ORAL | Status: DC
  Administered 2021-10-09 – 2021-10-17 (×20): 2.5 mg via ORAL
  Filled 2021-10-09 (×20): qty 1

## 2021-10-09 MED ORDER — HALOPERIDOL 2 MG PO TABS
5.0000 mg | ORAL_TABLET | Freq: Two times a day (BID) | ORAL | Status: DC
  Administered 2021-10-09 – 2021-10-17 (×15): 5 mg via ORAL
  Filled 2021-10-09 (×3): qty 1
  Filled 2021-10-09: qty 3
  Filled 2021-10-09 (×2): qty 1
  Filled 2021-10-09 (×2): qty 3
  Filled 2021-10-09 (×2): qty 1
  Filled 2021-10-09 (×5): qty 3
  Filled 2021-10-09: qty 1

## 2021-10-09 MED ORDER — SODIUM CHLORIDE 0.9 % IV SOLN
500.0000 mL | Freq: Once | INTRAVENOUS | Status: AC
  Administered 2021-10-09: 500 mL via INTRAVENOUS

## 2021-10-09 MED ORDER — LACTATED RINGERS IV BOLUS
500.0000 mL | Freq: Once | INTRAVENOUS | Status: AC
  Administered 2021-10-09: 500 mL via INTRAVENOUS

## 2021-10-09 MED ORDER — BOOST / RESOURCE BREEZE PO LIQD CUSTOM
1.0000 | Freq: Three times a day (TID) | ORAL | Status: DC
Start: 2021-10-09 — End: 2021-10-17
  Administered 2021-10-09 – 2021-10-16 (×18): 1 via ORAL

## 2021-10-09 MED ORDER — CLONAZEPAM 0.25 MG PO TBDP
0.2500 mg | ORAL_TABLET | Freq: Two times a day (BID) | ORAL | Status: DC
  Administered 2021-10-09 – 2021-10-17 (×15): 0.25 mg via ORAL
  Filled 2021-10-09 (×15): qty 1

## 2021-10-09 MED ORDER — ENOXAPARIN SODIUM 30 MG/0.3ML IJ SOSY
30.0000 mg | PREFILLED_SYRINGE | INTRAMUSCULAR | Status: DC
  Administered 2021-10-09 – 2021-10-16 (×8): 30 mg via SUBCUTANEOUS
  Filled 2021-10-09 (×8): qty 0.3

## 2021-10-09 MED ORDER — THIAMINE HCL 100 MG PO TABS
100.0000 mg | ORAL_TABLET | Freq: Every day | ORAL | Status: DC
  Administered 2021-10-10 – 2021-10-17 (×8): 100 mg via ORAL
  Filled 2021-10-09 (×8): qty 1

## 2021-10-09 NOTE — Progress Notes (Signed)
Nutrition Follow-up ? ?DOCUMENTATION CODES:  ? ?Underweight, Severe malnutrition in context of chronic illness ? ?INTERVENTION:  ? ?-D/c Prosource TF ?-Continue MVI with minerals daily ?-Boost Breeze po TID, each supplement provides 250 kcal and 9 grams of protein  ? ?NUTRITION DIAGNOSIS:  ? ?Severe Malnutrition related to chronic illness (paranoid schizophrenia) as evidenced by percent weight loss, moderate fat depletion, severe fat depletion, moderate muscle depletion, severe muscle depletion. ? ?Ongoing ? ?GOAL:  ? ?Patient will meet greater than or equal to 90% of their needs ? ?Progressing  ? ?MONITOR:  ? ?Diet advancement, Labs, Weight trends, TF tolerance, Skin, I & O's ? ?REASON FOR ASSESSMENT:  ? ?Malnutrition Screening Tool ?  ? ?ASSESSMENT:  ? ?[name] is a 61 y.o. female with medical history significant of paranoid schizophrenia presented to the emergency room on 09/12/21 brought by family for not 8 drinking and eating and becoming hypoglycemic.  Patient is not taking her meds and is having hallucinations and delusional thoughts. ? ?3/9- NGT placed ?3/18- s/p BSE- refused all PO intake ?3/20- ECT treatments initiated ?3/22- advanced to a regular diet ?3/23- NGT pulled, TF d/c ? ?Reviewed I/O's: +95 ml x 24 hours and +7 L since admission ? ?Case discussed with RN and Recruitment consultant. Pt pulled NGT out this morning. She consumed 100% of breakfast consisting of coffee, oatmeal, and bacon, and eggs. ? ?Spoke with pt at bedside, who was more alert compared to previous visits. She reports feeling better and denies any difficulty chewing or swallowing foods. She reports her appetite is improved. RD reinforced importance of good meal intake to support nutrition. She reports she does not like to drink milk products as "they run me to bathroom", but is amenable to [employer].  ? ?Medications reviewed and include remeron and thiamine.  ? ?Lab Results  ?Component Value Date  ? HGBA1C 5.5 10/02/2021  ? PTA DM  medications are .  ? ?Labs reviewed: Mg: 2.5, CBGS: 99-114 (inpatient orders for glycemic control are none).   ? ?Diet Order:   ?Diet Order   ? ?       ?  Diet regular Room service appropriate? Yes; Fluid consistency: Thin  Diet effective now       ?  ? ?  ?  ? ?  ? ? ?EDUCATION NEEDS:  ? ?Education needs have been addressed ? ?Skin:  Skin Assessment: Reviewed RN Assessment ? ?Last BM:  10/07/21 ? ?Height:  ? ?Ht Readings from Last 1 Encounters:  ?10/08/21 5\' 4"  (1.626 m)  ? ? ?Weight:  ? ?Wt Readings from Last 1 Encounters:  ?10/09/21 44.6 kg  ? ? ?Ideal Body Weight:  54.5 kg ? ?BMI:  Body mass index is 16.88 kg/m?. ? ?Estimated Nutritional Needs:  ? ?Kcal:  1750-1950 ? ?Protein:  90-105 grams ? ?Fluid:  > 1.7 L ? ? ? ?10/11/21, RD, LDN, CDCES ?Registered Dietitian II ?Certified Diabetes Care and Education Specialist ?Please refer to [facility] for RD and/or RD on-call/weekend/after hours pager  ?

## 2021-10-09 NOTE — Progress Notes (Signed)
Physical Therapy Treatment ?Patient Details ?Name: [name] ?MRN: [id number] ?DOB: 1961/01/12 ?Today's Date: 10/09/2021 ? ? ?History of Present Illness Shyloh Barretta is a 60yoF who comes to [facility] direct transfer from [facility] for ECT therapy. At home pt was experiencing hallucinations and delusional thoughts, known history of paranoid schizophrenia and recent medication noncompliance. Pt was not eating or drinking at home. ? ?  ?PT Comments  ? ? Patient tolerated physical therapy session well.  Was able to actively participate in completed several lower extremity strength and endurance exercises including sit to stands heel raises and marching without loss of balance.  Patient demonstrated good balance throughout session but did demonstrate some narrow base of support in hallway but no loss of balance occurred as a result.  Patient able to ambulate 420 feet in the hallway without an assistive device and with standby assist to contact-guard assist from therapist for safety.  No change in discharge recommendations at this time.  Patient left sitting in bed with in room with bedside sitter present. ?  ?Recommendations for follow up therapy are one component of a multi-disciplinary discharge planning process, led by the attending physician.  Recommendations may be updated based on patient status, additional functional criteria and insurance authorization. ? ?Follow Up Recommendations ?   ?  ?  ?Assistance Recommended at Discharge    ?Patient can return home with the following   ?  ?Equipment Recommendations ?    ?  ?Recommendations for Other Services   ? ? ?  ?Precautions / Restrictions    ?  ? ?Mobility ? Bed Mobility ?  ?  ? Independent  ?  ?  ?  ?  ?  ?  ? ?Transfers ?  ?  ? Independent  ?  ?  ?  ?  ?  ?  ?  ?  ? ?Ambulation/Gait ?  ? Supervision - CGA  ?  ?  ?  ?  ?  ?  ? ? ?Stairs ?  ?  ?  ?  ?  ? ? ?Wheelchair Mobility ?  ? ?Modified Rankin (Stroke Patients Only) ?  ? ? ?  ?Balance   ?  ?  ?  ?  ?  ?  ?  ?  ?  ?  ?  ?  ?   ?  ?  ?  ?  ?  ?  ? ?  ?Cognition Arousal/Alertness: Awake/alert ?Behavior During Therapy: [facility] for tasks assessed/performed ?Overall Cognitive Status: Within Functional Limits for tasks assessed ?  ?  ?  ?  ?  ?  ?  ?  ?  ?  ?  ?  ?  ?  ?  ?  ?General Comments: pleasant ?  ?  ? ?  ?Exercises General Exercises - Lower Extremity ?Long [employer]: 10 reps, Both, Strengthening, Seated ?Straight Leg Raises: Both, 10 reps, Supine, Strengthening ?Hip Flexion/Marching: Standing, 20 reps, AROM, Strengthening (first set with UE support, second set no UE support) ?Heel Raises: Standing, Both, 10 reps ?Other Exercises ?Other Exercises: Sit to stand 2 x 5 ?Other Exercises: Ambulation in hallway with no AD and for 360 feet with intermittent NBOS and correction and with relatively slow gait speed. no LOB and noted throughout ambulatory session and no fatigue noted. ? ?  ?General Comments   ?  ?  ? ?Pertinent Vitals/Pain Pain Assessment ?Pain Assessment: No/denies pain  ? ? ?Home Living Family/patient expects to be discharged to:: Private residence ?  ?Available  Help at Discharge: Family ?Type of Home: House ?Home Access: Stairs to enter ?Entrance Stairs-Rails: Right;Left ?Entrance Stairs-Number of Steps: 3 ?  ?Home Layout: One level ?Home Equipment: None ?   ?  ?Prior Function    ?  ?  ?   ? ?PT Goals (current goals can now be found in the care plan section)   ? ?  ?Frequency ? ? ? Min 2X/week ? ? ? ?  ?PT Plan    ? ? ?Co-evaluation   ?  ?  ?  ?  ? ?  ?AM-PAC PT "6 Clicks" Mobility   ?Outcome Measure ? Help needed turning from your back to your side while in a flat bed without using bedrails?: None ?Help needed moving from lying on your back to sitting on the side of a flat bed without using bedrails?: None ?Help needed moving to and from a bed to a chair (including a wheelchair)?: None ?Help needed standing up from a chair using your arms (e.g., wheelchair or bedside chair)?: None ?Help needed to walk in hospital room?: None ?Help  needed climbing 3-5 steps with a railing? : A Little ?6 Click Score: 23 ? ?  ?End of Session Equipment Utilized During Treatment: Gait belt ?Activity Tolerance: Patient tolerated treatment well;No increased pain ?Patient left: with nursing/sitter in room;with call bell/phone within reach;in bed ?Nurse Communication: Mobility status ?PT Visit Diagnosis: Difficulty in walking, not elsewhere classified (R26.2);Unsteadiness on feet (R26.81);Muscle weakness (generalized) (M62.81) ?  ? ? ?Time:  -  ?  ? ?Charges:             ?          ? ?Thresa Ross PT, DPT  ? ? ? ?Norman Herrlich ?10/09/2021, 4:35 PM ? ?

## 2021-10-09 NOTE — Assessment & Plan Note (Addendum)
Condition improved after giving midodrine. ?

## 2021-10-09 NOTE — Progress Notes (Signed)
Pt pulled out her NG tube. NP Foust made aware, no need for reinsertion as of the moment. Pt was able to consume fair amount of dinner by mouth last night. Latest CBG of 109mg /dL. Denies chest pain or difficulty of breathing. ?

## 2021-10-09 NOTE — Consult Note (Signed)
ECT: Patient seen.  Patient's nasogastric tube had been removed.  She told me that "something happened" when she was sleeping and she accidentally pulled it out.  Patient was wide awake and pleasantly interactive.  Answered questions in full sentences.  She told me she was feeling pretty good.  Told me she had had something to eat today. ? ?Certainly looks on the face of it right now very good.  I did not confirm that she had definitely been eating today but I am glad that at least she acknowledges that she should be eating.  I think she is looking like she is getting better.  We are still on schedule for ECT tomorrow.  It is possible that could be all she needs but we will reevaluate after that.  Encourage physical therapy and efforts to get her out of bed and more active in the meantime.  I put in n.p.o. order after midnight for treatment tomorrow.  Patient aware of the plan and has no complaints ? ?

## 2021-10-09 NOTE — Progress Notes (Signed)
?  Progress Note ? ? ?Patient: [name] QPY:[id number] DOB: 1961/06/26 DOA: 10/02/2021     7 ?DOS: the patient was seen and examined on 10/09/2021 ?  ?Brief hospital course: ?61 year old female with history of paranoid schizophrenia, anemia was initially admitted to [facility] long hospital on 09/12/2021 the patient had a long hospitalization and the decision was made to transfer the patient on 10/02/2021 over at [facility] for ECT treatments for paranoid schizophrenia because she was not eating.  The patient had an NG tube placed.  The patient was treated for electrolyte abnormalities during the hospital course.  The patient had her first ECT treatment treatment on 10/06/2021.  We will have ECT treated treatments on Monday, Wednesday and Friday. ? ?Assessment and Plan: ?* Schizophrenia, paranoid (HCC) ?First ECT treatment on 10/08/2021.  Continue ECT treatments Monday Wednesday Friday per psychiatry. ? ?Sinus bradycardia ?Condition has resolved. ? ?Hypokalemia, hypomagnesemia and hypophosphatemia ?Condition resolved. ? ? ?Protein-calorie malnutrition, severe (HCC) ?Patient pulled out the NG tube, spoke with the nurse, patient was eating 100% of meals, will also add protein supplements 3 times a day.  No additional tube feeding at this time. ? ?Anemia of chronic disease ?Hemoglobin has been stable at 11.5 today ? ?Hypotension ?Patient blood pressure has been low today, received 500 mL fluid bolus.  No evidence of bacterial infection. ?We will check cortisol level to rule out adrenal insufficiency.  Start midodrine 2.5 mg 3 times a day.  Patient currently asymptomatic. ? ? ? ? ?  ? ?Subjective:  ?Patient is doing much better today.  She has not confusion.  She has good appetite, she is eating 100% of her meals. ?No short of breath or cough. ?No abdominal pain or nausea vomiting or diarrhea ? ?Physical Exam: ?Vitals:  ? 10/09/21 0728 10/09/21 1013 10/09/21 1046 10/09/21 1156  ?BP: (!) 82/59 (!) 83/56 (!)  85/52 (!) 87/57  ?Pulse: 72 70 66 70  ?Resp: 17 18  17   ?Temp: 98.2 ?F (36.8 ?C) 97.7 ?F (36.5 ?C)  98.2 ?F (36.8 ?C)  ?TempSrc: Oral     ?SpO2: 98% 97%  97%  ?Weight:      ?Height:      ? ?General exam: Appears calm and comfortable  ?Respiratory system: Clear to auscultation. Respiratory effort normal. ?Cardiovascular system: S1 & S2 heard, RRR. No JVD, murmurs, rubs, gallops or clicks. No pedal edema. ?Gastrointestinal system: Abdomen is nondistended, soft and nontender. No organomegaly or masses felt. Normal bowel sounds heard. ?Central nervous system: Alert and oriented. No focal neurological deficits. ?Extremities: Symmetric 5 x 5 power. ?Skin: No rashes, lesions or ulcers ?Psychiatry: Judgement and insight appear normal. Mood & affect appropriate.   ?Data Reviewed: ? ?Reviewed all lab results. ? ?Family Communication:  ? ?Disposition: ?Status is: Inpatient ?Remains inpatient appropriate because: Severity of disease, inpatient procedure. ? Planned Discharge Destination: Home ? ? ? ?Time spent: 27 minutes ? ?Author: ? , MD ?10/09/2021 12:16 PM ? ?For on call review [email or web address].  ?

## 2021-10-09 NOTE — Progress Notes (Signed)
?   10/09/21 1100  ?Clinical Encounter Type  ?Visited With Patient  ?Visit Type Initial  ?Referral From Chaplain  ?Consult/Referral To Chaplain  ? ?Chaplain visited with patient and provided emotional support. Patient showed little interest in engaging in conversation. Chaplain will follow up at a later time. ?

## 2021-10-10 ENCOUNTER — Other Ambulatory Visit: Payer: Self-pay | Admitting: Psychiatry

## 2021-10-10 ENCOUNTER — Encounter: Payer: Self-pay | Admitting: Internal Medicine

## 2021-10-10 ENCOUNTER — Inpatient Hospital Stay: Payer: Medicare Other | Admitting: Registered Nurse

## 2021-10-10 ENCOUNTER — Inpatient Hospital Stay: Payer: Medicare Other

## 2021-10-10 DIAGNOSIS — R4182 Altered mental status, unspecified: Secondary | ICD-10-CM | POA: Diagnosis not present

## 2021-10-10 DIAGNOSIS — F209 Schizophrenia, unspecified: Secondary | ICD-10-CM | POA: Diagnosis not present

## 2021-10-10 LAB — GLUCOSE, CAPILLARY
Glucose-Capillary: 109 mg/dL — ABNORMAL HIGH (ref 70–99)
Glucose-Capillary: 102 mg/dL — ABNORMAL HIGH (ref 70–99)
Glucose-Capillary: 97 mg/dL (ref 70–99)
Glucose-Capillary: 109 mg/dL — ABNORMAL HIGH (ref 70–99)

## 2021-10-10 LAB — CORTISOL-AM, BLOOD: Cortisol - AM: 8.6 ug/dL (ref 6.7–22.6)

## 2021-10-10 MED ORDER — SUCCINYLCHOLINE CHLORIDE 200 MG/10ML IV SOSY
PREFILLED_SYRINGE | INTRAVENOUS | Status: DC | PRN
  Administered 2021-10-10: 60 mg via INTRAVENOUS

## 2021-10-10 MED ORDER — LACTULOSE 10 GM/15ML PO SOLN
20.0000 g | Freq: Once | ORAL | Status: AC
  Administered 2021-10-10: 20 g via ORAL
  Filled 2021-10-10: qty 30

## 2021-10-10 MED ORDER — METHOHEXITAL SODIUM 100 MG/10ML IV SOSY
PREFILLED_SYRINGE | INTRAVENOUS | Status: DC | PRN
Start: 2021-10-10 — End: 2021-10-10
  Administered 2021-10-10: 40 mg via INTRAVENOUS

## 2021-10-10 NOTE — Progress Notes (Signed)
Physical Therapy Treatment ?Patient Details ?Name: [name] ?MRN: [id number] ?DOB: [birth year] ?Today's Date: 10/10/2021 ? ? ?History of Present Illness [name] is a 60yoF who comes to [facility] direct transfer from [facility] for ECT therapy. At home pt was experiencing hallucinations and delusional thoughts, known history of paranoid schizophrenia and recent medication noncompliance. Pt was not eating or drinking at home. ? ?  ?PT Comments  ? ? Pt was supine in bed watching TV upon arriving. She agrees to PT session and is cooperative throughout. Planned ECT later this date. She was easily and safely able to exit bed, stand, and ambulate without AD. Safely demonstrated ability to ascend/descend stairs with supervision ascending but CGA for descending. Do recommend use of AD until full strength and balance return. No need for skilled PT at DC.  ?  ?Recommendations for follow up therapy are one component of a multi-disciplinary discharge planning process, led by the attending physician.  Recommendations may be updated based on patient status, additional functional criteria and insurance authorization. ? ?Follow Up Recommendations ? No PT follow up ?  ?  ?Assistance Recommended at Discharge Intermittent Supervision/Assistance  ?Patient can return home with the following A little help with walking and/or transfers;Assistance with cooking/housework;Help with stairs or ramp for entrance ?  ?Equipment Recommendations ? Rolling walker (2 wheels)  ?  ?   ?Precautions / Restrictions Precautions ?Precautions: Fall ?Restrictions ?Weight Bearing Restrictions: No  ?  ? ?Mobility ? Bed Mobility ?Overal bed mobility: Modified Independent ? General bed mobility comments: HOB elevated 20 degrees ?  ? ?Transfers ?Overall transfer level: Modified independent ?Equipment used: None ?Transfers: Sit to/from Stand ?Sit to Stand: Modified independent (Device/Increase time) ?  ?Ambulation/Gait ?Ambulation/Gait assistance: Supervision ?Gait Distance  (Feet): 200 Feet ?Assistive device: None ?Gait Pattern/deviations: Drifts right/left, Staggering right, Staggering left, Narrow base of support ?Gait velocity: decreased ?  ?  ?General Gait Details: pt was able to ambulate 200 ft without AD however does have drifting and several occasions of staggering however no intervention required. ? ? ?Stairs ?Stairs: Yes ?Stairs assistance: Min guard, Supervision ?Stair Management: One rail Left ?Number of Stairs: 12 ?General stair comments: pt performed ascending/descending 2 sets of 6 stair with LUE support only. Did require CGA for safety when descending stairs ? ? ?  ?   ?Cognition Arousal/Alertness: Awake/alert ?Behavior During Therapy: [employer] for tasks assessed/performed ?Overall Cognitive Status: Within Functional Limits for tasks assessed ?  ?   ?General Comments: pleasant ?  ?  ? ?  ?   ?   ? ?Pertinent Vitals/Pain Pain Assessment ?Pain Assessment: No/denies pain  ? ? ? ?PT Goals (current goals can now be found in the care plan section) Acute Rehab PT Goals ?Patient Stated Goal: go home ?Progress towards PT goals: Progressing toward goals ? ?  ?Frequency ? ? ? Min 2X/week ? ? ? ?  ?PT Plan Current plan remains appropriate  ? ? ?   ?AM-PAC PT "6 Clicks" Mobility   ?Outcome Measure ? Help needed turning from your back to your side while in a flat bed without using bedrails?: None ?Help needed moving from lying on your back to sitting on the side of a flat bed without using bedrails?: None ?Help needed moving to and from a bed to a chair (including a wheelchair)?: None ?Help needed standing up from a chair using your arms (e.g., wheelchair or bedside chair)?: None ?Help needed to walk in hospital room?: A Little ?Help needed climbing 3-5 steps with  a railing? : A Little ?6 Click Score: 22 ? ?  ?End of Session   ?Activity Tolerance: Patient tolerated treatment well;No increased pain ?Patient left: with nursing/sitter in room;with call bell/phone within reach;in bed ?Nurse  Communication: Mobility status ?PT Visit Diagnosis: Difficulty in walking, not elsewhere classified (R26.2);Unsteadiness on feet (R26.81);Muscle weakness (generalized) (M62.81) ?  ? ? ?Time: 3335-4562 ?PT Time Calculation (min) (ACUTE ONLY): 14 min ? ?Charges:  $Gait Training: 8-22 mins          ?          ?Jetta Lout PTA ?10/10/21, 1:13 PM  ? ?

## 2021-10-10 NOTE — Consult Note (Signed)
[facility] Face-to-Face Psychiatry Consult  ? ?Reason for Consult: Follow-up for this 61 year old woman with schizophrenia ?Referring Physician:  Roosevelt Locks ?Patient Identification: [name] ?MRN:  [id number] ?Principal Diagnosis: Schizophrenia, paranoid ([place]) ?Diagnosis:  Principal Problem: ?  Schizophrenia, paranoid ([place]) ?Active Problems: ?  Hypotension ?  Hypokalemia, hypomagnesemia and hypophosphatemia ?  Protein-calorie malnutrition, severe ([place]) ?  Anemia of chronic disease ?  Sinus bradycardia ? ? ?Total Time spent with patient: 30 minutes ? ?Subjective:   ?Charles Niese is a 61 y.o. female patient admitted with "I guess I am okay". ? ?HPI: Patient seen and chart reviewed.  Patient had her third ECT treatment today.  Bilateral treatment.  No complications noted.  Good quality seizure.  Patient seems significantly improved.  The target symptom of eating food is much better.  She now has the NG tube out and nursing confirms to me that she is eating meals.  Family also has met with the patient and confirms that her affect and behavior seem much better.  Patient has no complaints.  She does seem to have some short-term memory fuzziness.  No other complications from the treatment.  I note that she has been cooperating with physical therapy and getting up and moving around ? ?Past Psychiatric History: History of schizophrenia with previously documented episodes of refusing food when psychotic ? ?Risk to Self:   ?Risk to Others:   ?Prior Inpatient Therapy:   ?Prior Outpatient Therapy:   ? ?Past Medical History:  ?Past Medical History:  ?Diagnosis Date  ? Anemia 10/02/2021  ? Non compliance w medication regimen   ? Schizophrenia ([place])   ?  ?Past Surgical History:  ?Procedure Laterality Date  ? BIOPSY  09/25/2021  ? Procedure: BIOPSY;  Surgeon: Milus Banister, MD;  Location: [facility];  Service: Gastroenterology;;  ? ESOPHAGOGASTRODUODENOSCOPY (EGD) WITH PROPOFOL [name] 09/25/2021  ? Procedure: ESOPHAGOGASTRODUODENOSCOPY (EGD) WITH  PROPOFOL;  Surgeon: Milus Banister, MD;  Location: [facility];  Service: Gastroenterology;  Laterality: [name];  With NGT placement  ? ?Family History: History reviewed. No pertinent family history. ?Family Psychiatric  History: See previous ?Social History:  ?Social History  ? ?Substance and Sexual Activity  ?Alcohol Use Not Currently  ? Comment: Refuses to disclose how much  ?   ?Social History  ? ?Substance and Sexual Activity  ?Drug Use No  ? Comment: Refuses to answer  ?  ?Social History  ? ?Socioeconomic History  ? Marital status: Single  ?  Spouse name: Not on file  ? Number of children: Not on file  ? Years of education: Not on file  ? Highest education level: Not on file  ?Occupational History  ? Not on file  ?Tobacco Use  ? Smoking status: Some Days  ?  Packs/day: 0.50  ?  Types: Cigarettes  ? Smokeless tobacco: Never  ?Vaping Use  ? Vaping Use: Every day  ?Substance and Sexual Activity  ? Alcohol use: Not Currently  ?  Comment: Refuses to disclose how much  ? Drug use: No  ?  Comment: Refuses to answer  ? Sexual activity: Never  ?  Comment: refused to answer  ?Other Topics Concern  ? Not on file  ?Social History Narrative  ? Not on file  ? ?Social Determinants of Health  ? ?Financial Resource Strain: Not on file  ?Food Insecurity: Not on file  ?Transportation Needs: Not on file  ?Physical Activity: Not on file  ?Stress: Not on file  ?Social Connections: Not on file  ? ?  Additional Social History: ?  ? ?Allergies:   ?Allergies  ?Allergen Reactions  ? Aspirin Hives and Other (See Comments)  ?  Possibly caused hives, per sister  ? Ziprasidone Hcl Rash and Other (See Comments)  ?  Caused twitching  ? ? ?Labs:  ?Results for orders placed or performed during the hospital encounter of 10/02/21 (from the past 48 hour(s))  ?Glucose, capillary     Status: Abnormal  ? Collection Time: 10/09/21 12:41 AM  ?Result Value Ref Range  ? Glucose-Capillary 122 (H) 70 - 99 mg/dL  ?  Comment: Glucose reference range applies  only to samples taken after fasting for at least 8 hours.  ?Glucose, capillary     Status: Abnormal  ? Collection Time: 10/09/21  4:09 AM  ?Result Value Ref Range  ? Glucose-Capillary 109 (H) 70 - 99 mg/dL  ?  Comment: Glucose reference range applies only to samples taken after fasting for at least 8 hours.  ?Basic metabolic panel     Status: Abnormal  ? Collection Time: 10/09/21  6:24 AM  ?Result Value Ref Range  ? Sodium 136 135 - 145 mmol/L  ? Potassium 4.2 3.5 - 5.1 mmol/L  ? Chloride 95 (L) 98 - 111 mmol/L  ? CO2 33 (H) 22 - 32 mmol/L  ? Glucose, Bld 108 (H) 70 - 99 mg/dL  ?  Comment: Glucose reference range applies only to samples taken after fasting for at least 8 hours.  ? BUN 22 (H) 6 - 20 mg/dL  ? Creatinine, Ser 0.69 0.44 - 1.00 mg/dL  ? Calcium 9.3 8.9 - 10.3 mg/dL  ? GFR, Estimated >60 >60 mL/min  ?  Comment: (NOTE) ?Calculated using the CKD-EPI Creatinine Equation (2021) ?  ? Anion gap 8 5 - 15  ?  Comment: Performed at [facility], [address] [place], [place] [address]  ?Magnesium     Status: Abnormal  ? Collection Time: 10/09/21  6:24 AM  ?Result Value Ref Range  ? Magnesium 2.5 (H) 1.7 - 2.4 mg/dL  ?  Comment: Performed at [facility], [address] [place], [place] [address]  ?Phosphorus     Status: None  ? Collection Time: 10/09/21  6:24 AM  ?Result Value Ref Range  ? Phosphorus 4.5 2.5 - 4.6 mg/dL  ?  Comment: Performed at [facility], [address] [place], [place] [address]  ?CBC with Differential/Platelet     Status: Abnormal  ? Collection Time: 10/09/21  6:24 AM  ?Result Value Ref Range  ? WBC 7.0 4.0 - 10.5 K/uL  ? RBC 4.13 3.87 - 5.11 MIL/uL  ? Hemoglobin 11.5 (L) 12.0 - 15.0 g/dL  ? HCT 36.3 36.0 - 46.0 %  ? MCV 87.9 80.0 - 100.0 fL  ? MCH 27.8 26.0 - 34.0 pg  ? MCHC 31.7 30.0 - 36.0 g/dL  ? RDW 13.0 11.5 - 15.5 %  ? Platelets 302 150 - 400 K/uL  ? nRBC 0.0 0.0 - 0.2 %  ? Neutrophils Relative % 74 %  ? Neutro Abs 5.2 1.7 - 7.7 K/uL  ? Lymphocytes  Relative 17 %  ? Lymphs Abs 1.2 0.7 - 4.0 K/uL  ? Monocytes Relative 6 %  ? Monocytes Absolute 0.4 0.1 - 1.0 K/uL  ? Eosinophils Relative 3 %  ? Eosinophils Absolute 0.2 0.0 - 0.5 K/uL  ? Basophils Relative 0 %  ? Basophils Absolute 0.0 0.0 - 0.1 K/uL  ? Immature Granulocytes 0 %  ?  Abs Immature Granulocytes 0.01 0.00 - 0.07 K/uL  ?  Comment: Performed at [facility], [address], [place], [place] [address]  ?Glucose, capillary     Status: Abnormal  ? Collection Time: 10/09/21  7:31 AM  ?Result Value Ref Range  ? Glucose-Capillary 114 (H) 70 - 99 mg/dL  ?  Comment: Glucose reference range applies only to samples taken after fasting for at least 8 hours.  ?Glucose, capillary     Status: Abnormal  ? Collection Time: 10/09/21 11:58 AM  ?Result Value Ref Range  ? Glucose-Capillary 112 (H) 70 - 99 mg/dL  ?  Comment: Glucose reference range applies only to samples taken after fasting for at least 8 hours.  ?Glucose, capillary     Status: Abnormal  ? Collection Time: 10/09/21  3:27 PM  ?Result Value Ref Range  ? Glucose-Capillary 106 (H) 70 - 99 mg/dL  ?  Comment: Glucose reference range applies only to samples taken after fasting for at least 8 hours.  ?Glucose, capillary     Status: Abnormal  ? Collection Time: 10/09/21  9:39 PM  ?Result Value Ref Range  ? Glucose-Capillary 112 (H) 70 - 99 mg/dL  ?  Comment: Glucose reference range applies only to samples taken after fasting for at least 8 hours.  ?Glucose, capillary     Status: Abnormal  ? Collection Time: 10/10/21 12:59 AM  ?Result Value Ref Range  ? Glucose-Capillary 109 (H) 70 - 99 mg/dL  ?  Comment: Glucose reference range applies only to samples taken after fasting for at least 8 hours.  ?Glucose, capillary     Status: Abnormal  ? Collection Time: 10/10/21  3:57 AM  ?Result Value Ref Range  ? Glucose-Capillary 102 (H) 70 - 99 mg/dL  ?  Comment: Glucose reference range applies only to samples taken after fasting for at least 8 hours.  ?Cortisol-am,  blood     Status: None  ? Collection Time: 10/10/21  6:15 AM  ?Result Value Ref Range  ? Cortisol - AM 8.6 6.7 - 22.6 ug/dL  ?  Comment: Performed at [facility], [place] [address], [place], [place]

## 2021-10-10 NOTE — Transfer of Care (Signed)
Immediate Anesthesia Transfer of Care Note ? ?Patient: [name] ? ?Procedure(s) Performed: ECT TX ? ?Patient Location: PACU ? ?Anesthesia Type:General ? ?Level of Consciousness: awake, alert  and oriented ? ?Airway & Oxygen Therapy: Patient Spontanous Breathing ? ?Post-op Assessment: Report given to RN and Post -op Vital signs reviewed and stable ? ?Post vital signs: reviewed and stable ? ?Last Vitals:  ?Vitals Value Taken Time  ?BP 131/98 10/10/21 1202  ?Temp 36.2 ?C 10/10/21 1202  ?Pulse 71 10/10/21 1202  ?Resp 16 10/10/21 1202  ?SpO2 100 % 10/10/21 1202  ?Vitals shown include unvalidated device data. ? ?Last Pain:  ?Vitals:  ? 10/10/21 1202  ?TempSrc:   ?PainSc: 0-No pain  ?   ? ?  ? ?Complications: No notable events documented. ?

## 2021-10-10 NOTE — Procedures (Signed)
ECT SERVICES ?Physician?s Interval Evaluation & Treatment Note ? ?Patient Identification: [name] ?MRN:  [id number] ?Date of Evaluation:  10/10/2021 ?TX #: [id number] ? ?MADRS:  ? ?MMSE:  ? ?P.E. Findings: ? ?Still thin although looks a little neater ? ?Psychiatric Interval Note: ? ?More interactive than alert with a stronger voice. ? ?Subjective: ? ?Patient is a 61 y.o. female seen for evaluation for Electroconvulsive Therapy. ?Has no complaints ? ?Treatment Summary: ? ? []   Right Unilateral             [x]  Bilateral ?  ?% Energy : 1.0 ms 35% ? ? Impedance: 1210 ohms ? ?Seizure Energy Index: 14,159 ?V squared ? ?Postictal Suppression Index: 92% ? ?Seizure Concordance Index: 96% ? ?Medications ? ?Pre Shock: Brevital 40 mg succinylcholine 60 mg ? ?Post Shock:   ? ?Seizure Duration: 32 seconds EMG 47 seconds EEG ? ? ?Comments: ?I am recommending we continue treatment next week ? ?Lungs: ? ?[x]   Clear to auscultation              ? ?[]  Other:  ? ?Heart: ?  ? [x]   Regular rhythm             []  irregular rhythm ? ? ? [x]   Previous H&P reviewed, patient examined and there are NO CHANGES              ?  ? []   Previous H&P reviewed, patient examined and there are changes noted. ? ? ? , MD ?3/24/20235:42 PM ? ?  ?

## 2021-10-10 NOTE — H&P (Signed)
[name] is an 61 y.o. female.   ?Chief Complaint: Patient has no specific complaint.  Mood is improved.  Denies suicidal thoughts ?HPI: Schizophrenia with recurrent episodes of starvation while psychotic. ? ?Past Medical History:  ?Diagnosis Date  ? Anemia 10/02/2021  ? Non compliance w medication regimen   ? Schizophrenia (HCC)   ? ? ?Past Surgical History:  ?Procedure Laterality Date  ? BIOPSY  09/25/2021  ? Procedure: BIOPSY;  Surgeon: Rachael Fee, MD;  Location: [facility];  Service: Gastroenterology;;  ? ESOPHAGOGASTRODUODENOSCOPY (EGD) WITH PROPOFOL [name] 09/25/2021  ? Procedure: ESOPHAGOGASTRODUODENOSCOPY (EGD) WITH PROPOFOL;  Surgeon: Rachael Fee, MD;  Location: [facility];  Service: Gastroenterology;  Laterality: [name];  With NGT placement  ? ? ?History reviewed. No pertinent family history. ?Social History:  reports that she has been smoking cigarettes. She has been smoking an average of .5 packs per day. She has never used smokeless tobacco. She reports that she does not currently use alcohol. She reports that she does not use drugs. ? ?Allergies:  ?Allergies  ?Allergen Reactions  ? Aspirin Hives and Other (See Comments)  ?  Possibly caused hives, per sister  ? Ziprasidone Hcl Rash and Other (See Comments)  ?  Caused twitching  ? ? ?Medications Prior to Admission  ?Medication Sig Dispense Refill  ? benztropine (COGENTIN) 0.5 MG tablet Take 1 tablet (0.5 mg total) by mouth 2 (two) times daily as needed for tremors.    ? benztropine mesylate (COGENTIN) 1 MG/ML injection Inject 0.5 mLs (0.5 mg total) into the muscle 2 (two) times daily as needed for tremors. 2 mL   ? clonazePAM (KLONOPIN) 0.5 MG tablet Place 1 tablet (0.5 mg total) into feeding tube 2 (two) times daily. 30 tablet 0  ? mirtazapine (REMERON SOL-TAB) 15 MG disintegrating tablet Place 0.5 tablets (7.5 mg total) into feeding tube at bedtime. 30 tablet 0  ? Nutritional Supplements (FEEDING SUPPLEMENT, OSMOLITE 1.2 CAL,) LIQD Place 1,000  mLs into feeding tube continuous.  0  ? traZODone (DESYREL) 50 MG tablet Take 1 tablet (50 mg total) by mouth at bedtime as needed for sleep. 30 tablet 0  ? Water For Irrigation, Sterile (FREE WATER) SOLN Place 120 mLs into feeding tube every 6 (six) hours.    ? [EXPIRED] glucagon, human recombinant, (GLUCAGEN) 1 MG injection Inject 0.5 mg into the muscle once for 1 dose. 1 g 0  ? LORazepam (ATIVAN) 2 MG/ML injection Inject 1 mL (2 mg total) into the muscle every 6 (six) hours as needed (agitation). 1 mL 0  ? ? ?Results for orders placed or performed during the hospital encounter of 10/02/21 (from the past 48 hour(s))  ?Glucose, capillary     Status: Abnormal  ? Collection Time: 10/09/21 12:41 AM  ?Result Value Ref Range  ? Glucose-Capillary 122 (H) 70 - 99 mg/dL  ?  Comment: Glucose reference range applies only to samples taken after fasting for at least 8 hours.  ?Glucose, capillary     Status: Abnormal  ? Collection Time: 10/09/21  4:09 AM  ?Result Value Ref Range  ? Glucose-Capillary 109 (H) 70 - 99 mg/dL  ?  Comment: Glucose reference range applies only to samples taken after fasting for at least 8 hours.  ?Basic metabolic panel     Status: Abnormal  ? Collection Time: 10/09/21  6:24 AM  ?Result Value Ref Range  ? Sodium 136 135 - 145 mmol/L  ? Potassium 4.2 3.5 - 5.1 mmol/L  ? Chloride  95 (L) 98 - 111 mmol/L  ? CO2 33 (H) 22 - 32 mmol/L  ? Glucose, Bld 108 (H) 70 - 99 mg/dL  ?  Comment: Glucose reference range applies only to samples taken after fasting for at least 8 hours.  ? BUN 22 (H) 6 - 20 mg/dL  ? Creatinine, Ser 0.69 0.44 - 1.00 mg/dL  ? Calcium 9.3 8.9 - 10.3 mg/dL  ? GFR, Estimated >60 >60 mL/min  ?  Comment: (NOTE) ?Calculated using the CKD-EPI Creatinine Equation (2021) ?  ? Anion gap 8 5 - 15  ?  Comment: Performed at [facility], [address], [place], [place] [address]  ?Magnesium     Status: Abnormal  ? Collection Time: 10/09/21  6:24 AM  ?Result Value Ref Range  ? Magnesium 2.5  (H) 1.7 - 2.4 mg/dL  ?  Comment: Performed at [facility], [address] [place], [place] [address]  ?Phosphorus     Status: None  ? Collection Time: 10/09/21  6:24 AM  ?Result Value Ref Range  ? Phosphorus 4.5 2.5 - 4.6 mg/dL  ?  Comment: Performed at [facility], [address], [place], [place] [address]  ?CBC with Differential/Platelet     Status: Abnormal  ? Collection Time: 10/09/21  6:24 AM  ?Result Value Ref Range  ? WBC 7.0 4.0 - 10.5 K/uL  ? RBC 4.13 3.87 - 5.11 MIL/uL  ? Hemoglobin 11.5 (L) 12.0 - 15.0 g/dL  ? HCT 36.3 36.0 - 46.0 %  ? MCV 87.9 80.0 - 100.0 fL  ? MCH 27.8 26.0 - 34.0 pg  ? MCHC 31.7 30.0 - 36.0 g/dL  ? RDW 13.0 11.5 - 15.5 %  ? Platelets 302 150 - 400 K/uL  ? nRBC 0.0 0.0 - 0.2 %  ? Neutrophils Relative % 74 %  ? Neutro Abs 5.2 1.7 - 7.7 K/uL  ? Lymphocytes Relative 17 %  ? Lymphs Abs 1.2 0.7 - 4.0 K/uL  ? Monocytes Relative 6 %  ? Monocytes Absolute 0.4 0.1 - 1.0 K/uL  ? Eosinophils Relative 3 %  ? Eosinophils Absolute 0.2 0.0 - 0.5 K/uL  ? Basophils Relative 0 %  ? Basophils Absolute 0.0 0.0 - 0.1 K/uL  ? Immature Granulocytes 0 %  ? Abs Immature Granulocytes 0.01 0.00 - 0.07 K/uL  ?  Comment: Performed at [facility], [address] [place], [place] [address]  ?Glucose, capillary     Status: Abnormal  ? Collection Time: 10/09/21  7:31 AM  ?Result Value Ref Range  ? Glucose-Capillary 114 (H) 70 - 99 mg/dL  ?  Comment: Glucose reference range applies only to samples taken after fasting for at least 8 hours.  ?Glucose, capillary     Status: Abnormal  ? Collection Time: 10/09/21 11:58 AM  ?Result Value Ref Range  ? Glucose-Capillary 112 (H) 70 - 99 mg/dL  ?  Comment: Glucose reference range applies only to samples taken after fasting for at least 8 hours.  ?Glucose, capillary     Status: Abnormal  ? Collection Time: 10/09/21  3:27 PM  ?Result Value Ref Range  ? Glucose-Capillary 106 (H) 70 - 99 mg/dL  ?  Comment: Glucose reference range applies only to  samples taken after fasting for at least 8 hours.  ?Glucose, capillary     Status: Abnormal  ? Collection Time: 10/09/21  9:39 PM  ?Result Value Ref Range  ? Glucose-Capillary 112 (H) 70 - 99 mg/dL  ?  Comment: Glucose reference range applies only to samples taken after fasting for at least 8 hours.  ?Glucose, capillary     Status: Abnormal  ? Collection Time: 10/10/21 12:59 AM  ?Result Value Ref Range  ? Glucose-Capillary 109 (H) 70 - 99 mg/dL  ?  Comment: Glucose reference range applies only to samples taken after fasting for at least 8 hours.  ?Glucose, capillary     Status: Abnormal  ? Collection Time: 10/10/21  3:57 AM  ?Result Value Ref Range  ? Glucose-Capillary 102 (H) 70 - 99 mg/dL  ?  Comment: Glucose reference range applies only to samples taken after fasting for at least 8 hours.  ?Cortisol-am, blood     Status: None  ? Collection Time: 10/10/21  6:15 AM  ?Result Value Ref Range  ? Cortisol - AM 8.6 6.7 - 22.6 ug/dL  ?  Comment: Performed at [facility], [address] [place], [place] [id number]  ?Glucose, capillary     Status: None  ? Collection Time: 10/10/21  8:05 AM  ?Result Value Ref Range  ? Glucose-Capillary 97 70 - 99 mg/dL  ?  Comment: Glucose reference range applies only to samples taken after fasting for at least 8 hours.  ?Glucose, capillary     Status: Abnormal  ? Collection Time: 10/10/21  5:05 PM  ?Result Value Ref Range  ? Glucose-Capillary 109 (H) 70 - 99 mg/dL  ?  Comment: Glucose reference range applies only to samples taken after fasting for at least 8 hours.  ? ?No results found. ? ?Review of Systems  ?Constitutional: Negative.   ?HENT: Negative.    ?Eyes: Negative.   ?Respiratory: Negative.    ?Cardiovascular: Negative.   ?Gastrointestinal: Negative.   ?Musculoskeletal: Negative.   ?Skin: Negative.   ?Neurological: Negative.   ?Psychiatric/Behavioral: Negative.    ? ?Blood pressure (!) 85/43, pulse 77, temperature 98.2 ?F (36.8 ?C), resp. rate 19, height 5\' 4"  (1.626 m),  weight 45 kg, SpO2 99 %. ?Physical Exam ?Vitals and nursing note reviewed.  ?Constitutional:   ?   Appearance: She is well-developed.  ?HENT:  ?   Head: Normocephalic and atraumatic.  ?Eyes:  ?   Conjunctiva

## 2021-10-10 NOTE — Anesthesia Postprocedure Evaluation (Signed)
Anesthesia Post Note ? ?Patient: [name] ? ?Procedure(s) Performed: ECT TX ? ?Patient location during evaluation: PACU ?Anesthesia Type: General ?Level of consciousness: awake and alert, oriented and patient cooperative ?Pain management: pain level controlled ?Vital Signs Assessment: post-procedure vital signs reviewed and stable ?Respiratory status: spontaneous breathing, nonlabored ventilation and respiratory function stable ?Cardiovascular status: blood pressure returned to baseline and stable ?Postop Assessment: adequate PO intake ?Anesthetic complications: no ? ? ?No notable events documented. ? ? ?Last Vitals:  ?Vitals:  ? 10/10/21 1214 10/10/21 1218  ?BP: 98/68 111/73  ?Pulse: 71   ?Resp: 19 16  ?Temp: (!) 36.2 ?C (!) 36.2 ?C  ?SpO2: 99%   ?  ?Last Pain:  ?Vitals:  ? 10/10/21 1218  ?TempSrc: Temporal  ?PainSc:   ? ? ?  ?  ?  ?  ?  ?  ? ?[name] ? ? ? ? ?

## 2021-10-10 NOTE — Anesthesia Preprocedure Evaluation (Signed)
Anesthesia Evaluation  ?Patient identified by MRN, date of birth, ID band ?Patient awake ? ? ? ?Reviewed: ?Allergy & Precautions, NPO status , Patient's Chart, lab work & pertinent test results ? ?History of Anesthesia Complications ?Negative for: history of anesthetic complications ? ?Airway ?Mallampati: IV ? ? ?Neck ROM: Full ? ? ? Dental ? ? ?Missing upper teeth x2:   ?Pulmonary ?Current Smoker and Patient abstained from smoking.,  ?  ?Pulmonary exam normal ?breath sounds clear to auscultation ? ? ? ? ? ? Cardiovascular ?Normal cardiovascular exam ?Rhythm:Regular Rate:Normal ? ?ECG 10/02/21:  ?Normal sinus rhythm ?T wave abnormality, consider anterolateral ischemia ?  ?Neuro/Psych ?PSYCHIATRIC DISORDERS Schizophrenia negative neurological ROS ?   ? GI/Hepatic ?negative GI ROS,   ?Endo/Other  ?negative endocrine ROS ? Renal/GU ?negative Renal ROS  ? ?  ?Musculoskeletal ? ? Abdominal ?  ?Peds ? Hematology ? ?(+) Blood dyscrasia, anemia ,   ?Anesthesia Other Findings ? ? Reproductive/Obstetrics ? ?  ? ? ? ? ? ? ? ? ? ? ? ? ? ?  ?  ? ? ? ? ? ? ? ?Anesthesia Physical ?Anesthesia Plan ? ?ASA: 3 ? ?Anesthesia Plan: General  ? ?Post-op Pain Management:   ? ?Induction: Intravenous ? ?PONV Risk Score and Plan: 2 and TIVA and Treatment may vary due to age or medical condition ? ?Airway Management Planned: Natural Airway ? ?Additional Equipment:  ? ?Intra-op Plan:  ? ?Post-operative Plan:  ? ?Informed Consent: I have reviewed the patients History and Physical, chart, labs and discussed the procedure including the risks, benefits and alternatives for the proposed anesthesia with the patient or authorized representative who has indicated his/her understanding and acceptance.  ? ? ? ? ? ?Plan Discussed with: CRNA ? ?Anesthesia Plan Comments: (LMA/GETA backup discussed.  Serial consent on chart.  Patient consented for risks of anesthesia including but not limited to:  ?- adverse reactions to  medications ?- damage to eyes, teeth, lips or other oral mucosa ?- nerve damage due to positioning  ?- sore throat or hoarseness ?- damage to heart, brain, nerves, lungs, other parts of body or loss of life ? ?Informed patient about role of CRNA in peri- and intra-operative care.  Patient voiced understanding.)  ? ? ? ? ? ?Anesthesia Quick Evaluation ? ?

## 2021-10-10 NOTE — Progress Notes (Signed)
?  Progress Note ? ? ?Patient: [name] WLN:[id number] DOB: 05/27/1961 DOA: 10/02/2021     8 ?DOS: the patient was seen and examined on 10/10/2021 ?  ?Brief hospital course: ?61 year old female with history of paranoid schizophrenia, anemia was initially admitted to [facility] long hospital on 09/12/2021 the patient had a long hospitalization and the decision was made to transfer the patient on 10/02/2021 over at [facility] for ECT treatments for paranoid schizophrenia because she was not eating.  The patient had an NG tube placed.  The patient was treated for electrolyte abnormalities during the hospital course.  The patient had her first ECT treatment treatment on 10/06/2021.  We will have ECT treated treatments on Monday, Wednesday and Friday. ? ?Assessment and Plan: ?* Schizophrenia, paranoid (HCC) ?First ECT treatment on 10/08/2021.  Continue ECT treatments Monday Wednesday Friday per psychiatry. ? ?Sinus bradycardia ?Condition has resolved. ? ?Hypokalemia, hypomagnesemia and hypophosphatemia ?Condition resolved. ? ? ?Protein-calorie malnutrition, severe (HCC) ?Patient appetite has improved, continue protein supplements. ? ?Anemia of chronic disease ?Hemoglobin stable. ? ?Hypotension ?Blood pressure is better after starting midodrine.  Early morning cortisol level 8.5.  Clinically, no adrenal insufficiency ? ? ? ? ?  ? ?Subjective:  ?Patient doing well today, currently she has no complaints.  She feels hungry, no nausea vomiting abdominal pain.  She had a normal bowel movement. ? ?Physical Exam: ?Vitals:  ? 10/10/21 1200 10/10/21 1202 10/10/21 1214 10/10/21 1218  ?BP: 131/90 (!) 131/98 98/68 111/73  ?Pulse: 83 80 71   ?Resp: 19 19 19 16   ?Temp: (!) 97.1 ?F (36.2 ?C) (!) 97.1 ?F (36.2 ?C) (!) 97.2 ?F (36.2 ?C) (!) 97.2 ?F (36.2 ?C)  ?TempSrc:    Temporal  ?SpO2: 99% 98% 99%   ?Weight:      ?Height:      ? ?General exam: Appears calm and comfortable  ?Respiratory system: Clear to auscultation.  Respiratory effort normal. ?Cardiovascular system: S1 & S2 heard, RRR. No JVD, murmurs, rubs, gallops or clicks. No pedal edema. ?Gastrointestinal system: Abdomen is nondistended, soft and nontender. No organomegaly or masses felt. Normal bowel sounds heard. ?Central nervous system: Alert and oriented. No focal neurological deficits. ?Extremities: Symmetric 5 x 5 power. ?Skin: No rashes, lesions or ulcers ?Psychiatry: Judgement and insight appear normal. Mood & affect appropriate.  ? ?Data Reviewed: ? ?Reviewed lab results ? ?Family Communication:  ? ?Disposition: ?Status is: Inpatient ?Remains inpatient appropriate because: Severity of disease, inpatient procedures. ? Planned Discharge Destination: Home ? ? ? ?Time spent: 24 minutes ? ?Author: ? , MD ?10/10/2021 12:49 PM ? ?For on call review [email or web address].  ?

## 2021-10-11 ENCOUNTER — Other Ambulatory Visit: Payer: Self-pay

## 2021-10-11 LAB — GLUCOSE, CAPILLARY: Glucose-Capillary: 103 mg/dL — ABNORMAL HIGH (ref 70–99)

## 2021-10-11 NOTE — Progress Notes (Signed)
Mobility Specialist - Progress Note ? ? 10/11/21 1600  ?Mobility  ?Activity Ambulated with assistance in hallway;Stood at bedside;Dangled on edge of bed  ?Level of Assistance Standby assist, set-up cues, supervision of patient - no hands on  ?Assistive Device None  ?Distance Ambulated (ft) 180 ft  ?Activity Response Tolerated well  ?$Mobility charge 1 Mobility  ? ? ?Pt supine upon arrival using RA. Completes bed mobility and STS indep. Ambulates with SUPERVISION--- drifts L and R periodically but maintains balance and self corrects. Pt returns to bed with needs in reach and bed alarm set. ? ?[name] ?Mobility Specialist ?10/11/21, 4:08 PM ? ? ? ? ?

## 2021-10-11 NOTE — Progress Notes (Signed)
?  Progress Note ? ? ?Patient: [name] ZRA:[id number] DOB: Sep 10, 1960 DOA: 10/02/2021     9 ?DOS: the patient was seen and examined on 10/11/2021 ?  ?Brief hospital course: ?61 year old female with history of paranoid schizophrenia, anemia was initially admitted to [facility] long hospital on 09/12/2021 the patient had a long hospitalization and the decision was made to transfer the patient on 10/02/2021 over at [facility] for ECT treatments for paranoid schizophrenia because she was not eating.  The patient had an NG tube placed.  The patient was treated for electrolyte abnormalities during the hospital course.  The patient had her first ECT treatment treatment on 10/06/2021.  We will have ECT treated treatments on Monday, Wednesday and Friday. ?3/25.  Patient condition much improved, she has good appetite, NG tube was pulled out 2 days ago. ? ?Assessment and Plan: ?* Schizophrenia, paranoid (HCC) ?First ECT treatment on 10/08/2021.  Patient condition much improved, patient mental status improved, appetite also improved.  Psychiatry will continue ECT for another week. ? ?Sinus bradycardia ?Condition has resolved. ? ?Hypokalemia, hypomagnesemia and hypophosphatemia ?Condition resolved. ? ? ?Protein-calorie malnutrition, severe (HCC) ?Patient has good appetite, continue protein supplements. ? ?Anemia of chronic disease ?Hemoglobin stable. ? ?Hypotension ?Condition improved after giving midodrine. ? ? ? ? ?  ? ?Subjective:  ?Patient doing well, good appetite.  No nausea vomiting. ?No shortness of breath.  No confusion. ? ?Physical Exam: ?Vitals:  ? 10/10/21 2351 10/11/21 0525 10/11/21 0735 10/11/21 1139  ?BP: (!) 97/57 (!) 111/57 91/64 105/64  ?Pulse: 62 66 68 70  ?Resp: 16 16 18 17   ?Temp: 97.8 ?F (36.6 ?C) 98.4 ?F (36.9 ?C) 98.1 ?F (36.7 ?C) 98.2 ?F (36.8 ?C)  ?TempSrc:    Oral  ?SpO2: 98% 100% 99% 100%  ?Weight:  44.7 kg    ?Height:      ? ?General exam: Appears calm and comfortable  ?Respiratory  system: Clear to auscultation. Respiratory effort normal. ?Cardiovascular system: S1 & S2 heard, RRR. No JVD, murmurs, rubs, gallops or clicks. No pedal edema. ?Gastrointestinal system: Abdomen is nondistended, soft and nontender. No organomegaly or masses felt. Normal bowel sounds heard. ?Central nervous system: Alert and oriented. No focal neurological deficits. ?Extremities: Symmetric 5 x 5 power. ?Skin: No rashes, lesions or ulcers ?Psychiatry: Judgement and insight appear normal. Mood & affect appropriate.  ? ?Data Reviewed: ? ?There are no new results to review at this time. ? ?Family Communication:  ? ?Disposition: ?Status is: Inpatient ?Remains inpatient appropriate because: Severity of disease and inpatient procedure. ? Planned Discharge Destination: Home ? ? ? ?Time spent: 22 minutes ? ?Author: ? , MD ?10/11/2021 12:51 PM ? ?For on call review [email or web address].  ?

## 2021-10-12 LAB — GLUCOSE, CAPILLARY: Glucose-Capillary: 89 mg/dL (ref 70–99)

## 2021-10-12 NOTE — Progress Notes (Signed)
?  Progress Note ? ? ?Patient: [name] TDH:[id number] DOB: 04-16-1961 DOA: 10/02/2021     10 ?DOS: the patient was seen and examined on 10/12/2021 ?  ?Brief hospital course: ?61 year old female with history of paranoid schizophrenia, anemia was initially admitted to [facility] long hospital on 09/12/2021 the patient had a long hospitalization and the decision was made to transfer the patient on 10/02/2021 over at [facility] for ECT treatments for paranoid schizophrenia because she was not eating.  The patient had an NG tube placed.  The patient was treated for electrolyte abnormalities during the hospital course.  The patient had her first ECT treatment treatment on 10/06/2021.  We will have ECT treated treatments on Monday, Wednesday and Friday. ?3/25.  Patient condition much improved, she has good appetite, NG tube was pulled out 2 days ago. ? ?Assessment and Plan: ?* Schizophrenia, paranoid (HCC) ?First ECT treatment on 10/08/2021.  Currently receiving ECT 3 times a week.  Per psychiatry, patient will need another week of treatment before discharge.  Condition had improved. ? ?Sinus bradycardia ?Condition has resolved. ? ?Hypokalemia, hypomagnesemia and hypophosphatemia ?Condition resolved. ? ? ?Protein-calorie malnutrition, severe (HCC) ?Continue protein supplements. ? ?Anemia of chronic disease ?Hemoglobin stable. ? ?Hypotension ?Condition improved after giving midodrine. ? ? ? ? ?  ? ?Subjective:  ?Patient slept well last night.  She has good appetite without nausea vomiting.  Normal bowel movements. ?No confusion. ? ?Physical Exam: ?Vitals:  ? 10/12/21 0407 10/12/21 0500 10/12/21 0819 10/12/21 1224  ?BP: 101/66  95/61 (!) 92/56  ?Pulse: 68  (!) 51 (!) 57  ?Resp: 18  17 16   ?Temp: 97.6 ?F (36.4 ?C)  97.7 ?F (36.5 ?C) 97.7 ?F (36.5 ?C)  ?TempSrc: Oral     ?SpO2: 100%  100% 99%  ?Weight:  44.1 kg    ?Height:      ? ?General exam: Appears calm and comfortable  ?Respiratory system: Clear to  auscultation. Respiratory effort normal. ?Cardiovascular system: S1 & S2 heard, RRR. No JVD, murmurs, rubs, gallops or clicks. No pedal edema. ?Gastrointestinal system: Abdomen is nondistended, soft and nontender. No organomegaly or masses felt. Normal bowel sounds heard. ?Central nervous system: Alert and oriented. No focal neurological deficits. ?Extremities: Symmetric 5 x 5 power. ?Skin: No rashes, lesions or ulcers ?Psychiatry: Judgement and insight appear normal. Mood & affect appropriate.  ? ?Data Reviewed: ? ?There are no new results to review at this time. ? ?Family Communication:  ? ?Disposition: ?Status is: Inpatient ?Remains inpatient appropriate because: Inpatient procedures ? Planned Discharge Destination: Home with Home Health ? ? ? ?Time spent: 23 minutes ? ?Author: , MD ?10/12/2021 12:28 PM ? ?For on call review [email or web address].  ?

## 2021-10-12 NOTE — Progress Notes (Signed)
Mobility Specialist - Progress Note ? ? ? 10/12/21 1300  ?Mobility  ?Activity Ambulated with assistance in hallway;Stood at bedside;Dangled on edge of bed  ?Level of Assistance Standby assist, set-up cues, supervision of patient - no hands on  ?Assistive Device None  ?Distance Ambulated (ft) 400 ft  ?Activity Response Tolerated well  ?$Mobility charge 1 Mobility  ? ? ?Pre-mobility: 71 HR, 98% SpO2 ?During mobility: 67 HR, 96% SpO2 ? ?Pt sitting in bed upon arrival using RA. Pt sits EOB and completes STS indep and ambulates with supervision. Less drifting during ambulation this date. Returns to bed with needs in reach. ? ?Clarisa Schools ?Mobility Specialist ?10/12/21, 1:04 PM ? ? ? ? ?

## 2021-10-12 NOTE — TOC Progression Note (Signed)
Transition of Care (TOC) - Progression Note  ? ? ?Patient Details  ?Name: [name] ?MRN: [id number] ?Date of Birth: 06-01-1961 ? ?Transition of Care (TOC) CM/SW Contact  ?Aubery Date, LCSW ?Phone Number: ?10/12/2021, 3:39 PM ? ?Clinical Narrative:    ? ?Patient will receive ECT or the next two weeks.  Psychiatrist is following for d/c plan after ECT. ? ?Expected Discharge Plan: Home/Self Care ?Barriers to Discharge: Continued Medical Work up ? ?Expected Discharge Plan and Services ?Expected Discharge Plan: Home/Self Care ?  ?  ?  ?  ?                ?  ?  ?  ?  ?  ?  ?  ?  ?  ?  ? ? ?Social Determinants of Health (SDOH) Interventions ?  ? ?Readmission Risk Interventions ?   ? View : No data to display.  ?  ?  ?  ? ? ?

## 2021-10-13 ENCOUNTER — Ambulatory Visit: Payer: Medicare Other

## 2021-10-13 ENCOUNTER — Encounter: Payer: Self-pay | Admitting: Internal Medicine

## 2021-10-13 ENCOUNTER — Inpatient Hospital Stay: Payer: Medicare Other | Admitting: Certified Registered"

## 2021-10-13 DIAGNOSIS — R630 Anorexia: Secondary | ICD-10-CM | POA: Diagnosis not present

## 2021-10-13 DIAGNOSIS — I472 Ventricular tachycardia, unspecified: Secondary | ICD-10-CM

## 2021-10-13 DIAGNOSIS — R4182 Altered mental status, unspecified: Secondary | ICD-10-CM | POA: Diagnosis not present

## 2021-10-13 LAB — BASIC METABOLIC PANEL
Anion gap: 10 (ref 5–15)
BUN: 17 mg/dL (ref 6–20)
CO2: 29 mmol/L (ref 22–32)
Calcium: 8.9 mg/dL (ref 8.9–10.3)
Chloride: 99 mmol/L (ref 98–111)
Creatinine, Ser: 0.67 mg/dL (ref 0.44–1.00)
GFR, Estimated: >60 mL/min (ref >60)
Glucose, Bld: 93 mg/dL (ref 70–99)
Potassium: 3.8 mmol/L (ref 3.5–5.1)
Sodium: 138 mmol/L (ref 135–145)

## 2021-10-13 LAB — TROPONIN I (HIGH SENSITIVITY)
Troponin I (High Sensitivity): 5 ng/L (ref <18)
Troponin I (High Sensitivity): 4 ng/L (ref <18)

## 2021-10-13 MED ORDER — GLYCOPYRROLATE 0.2 MG/ML IJ SOLN
INTRAMUSCULAR | Status: DC | PRN
  Administered 2021-10-13: .2 mg via INTRAVENOUS

## 2021-10-13 MED ORDER — SODIUM CHLORIDE 0.9 % IV SOLN: 500.0000 mL | Freq: Once | INTRAVENOUS | Status: DC

## 2021-10-13 MED ORDER — METHOHEXITAL SODIUM 0.5 G IJ SOLR
INTRAMUSCULAR | Status: AC
  Filled 2021-10-13: qty 500

## 2021-10-13 MED ORDER — SUCCINYLCHOLINE CHLORIDE 200 MG/10ML IV SOSY
PREFILLED_SYRINGE | INTRAVENOUS | Status: DC | PRN
  Administered 2021-10-13: 60 mg via INTRAVENOUS

## 2021-10-13 MED ORDER — METHOHEXITAL SODIUM 100 MG/10ML IV SOSY
PREFILLED_SYRINGE | INTRAVENOUS | Status: DC | PRN
Start: 2021-10-13 — End: 2021-10-13
  Administered 2021-10-13: 40 mg via INTRAVENOUS

## 2021-10-13 NOTE — Consult Note (Signed)
?Cardiology Consultation:  ? ?Patient ID: [name] ?MRN: [id number]; DOB: 1961/06/08 ? ?Admit date: 10/02/2021 ?Date of Consult: 10/13/2021 ? ?PCP:  [facility] ?  ?[facility] Providers ?Cardiologist:  None   new [name])   ? ? ?Patient Profile:  ? ?[name] is a 61 y.o. female with a hx of paranoid schizophrenia who is being seen 10/13/2021 for the evaluation of ventricular tachycardia at the request of Dr. Chipper Herb. ? ?History of Present Illness:  ? ?[name] is a 61 year old female with known history of paranoid schizophrenia and protein calorie malnutrition with a BMI of 16.7.  She was transferred from [facility] long hospital  to [facility] for ECT treatment due to episodes of refusing food in the setting of psychosis.  She has been treated with ECT since March 20 3 times a week with no issues.  During today's treatment, the patient was noted to be tachycardic.  Rhythm strip was performed and showed evidence of wide-complex tachycardia at a rate of 170 bpm.  This lasted for few minutes.  The patient denies any chest pain, shortness of breath or palpitations.  She reports no prior syncope or presyncope.  She is a poor historian overall. ? ? ?Past Medical History:  ?Diagnosis Date  ? Anemia 10/02/2021  ? Non compliance w medication regimen   ? Schizophrenia (HCC)   ? ? ?Past Surgical History:  ?Procedure Laterality Date  ? BIOPSY  09/25/2021  ? Procedure: BIOPSY;  Surgeon: Rachael Fee, MD;  Location: [facility];  Service: Gastroenterology;;  ? ESOPHAGOGASTRODUODENOSCOPY (EGD) WITH PROPOFOL [name] 09/25/2021  ? Procedure: ESOPHAGOGASTRODUODENOSCOPY (EGD) WITH PROPOFOL;  Surgeon: Rachael Fee, MD;  Location: [facility];  Service: Gastroenterology;  Laterality: [name];  With NGT placement  ?  ? ?Home Medications:  ?Prior to Admission medications   ?Medication Sig Start Date End Date Taking? Authorizing Provider  ?benztropine (COGENTIN) 0.5 MG tablet Take 1 tablet (0.5 mg total) by mouth 2 (two) times daily as  needed for tremors. 10/02/21  Yes Rhetta Mura, MD  ?benztropine mesylate (COGENTIN) 1 MG/ML injection Inject 0.5 mLs (0.5 mg total) into the muscle 2 (two) times daily as needed for tremors. 10/02/21  Yes Rhetta Mura, MD  ?clonazePAM (KLONOPIN) 0.5 MG tablet Place 1 tablet (0.5 mg total) into feeding tube 2 (two) times daily. 10/02/21  Yes Rhetta Mura, MD  ?mirtazapine (REMERON SOL-TAB) 15 MG disintegrating tablet Place 0.5 tablets (7.5 mg total) into feeding tube at bedtime. 10/02/21  Yes Rhetta Mura, MD  ?Nutritional Supplements (FEEDING SUPPLEMENT, OSMOLITE 1.2 CAL,) LIQD Place 1,000 mLs into feeding tube continuous. 10/02/21  Yes Rhetta Mura, MD  ?traZODone (DESYREL) 50 MG tablet Take 1 tablet (50 mg total) by mouth at bedtime as needed for sleep. 10/09/20  Yes Sanjuana Kava, NP  ?Water For Irrigation, Sterile (FREE WATER) SOLN Place 120 mLs into feeding tube every 6 (six) hours. 10/02/21  Yes Rhetta Mura, MD  ?LORazepam (ATIVAN) 2 MG/ML injection Inject 1 mL (2 mg total) into the muscle every 6 (six) hours as needed (agitation). 10/02/21   Rhetta Mura, MD  ? ? ?Inpatient Medications: ?Scheduled Meds: ? clonazepam  0.25 mg Oral BID  ? enoxaparin (LOVENOX) injection  30 mg Subcutaneous Q24H  ? feeding supplement  1 Container Oral TID BM  ? haloperidol  5 mg Oral BID  ? haloperidol lactate  5 mg Intravenous Once  ? mouth rinse  15 mL Mouth Rinse q12n4p  ? midodrine  2.5 mg Oral  TID WC  ? mirtazapine  7.5 mg Per Tube QHS  ? multivitamin with minerals  1 tablet Per Tube Daily  ? thiamine  100 mg Oral Daily  ? ?Continuous Infusions: ? sodium chloride    ? ?PRN Meds: ?acetaminophen **OR** acetaminophen, atropine, benztropine, menthol-cetylpyridinium, traZODone ? ?Allergies:    ?Allergies  ?Allergen Reactions  ? Aspirin Hives and Other (See Comments)  ?  Possibly caused hives, per sister  ? Ziprasidone Hcl Rash and Other (See Comments)  ?  Caused twitching   ? ? ?Social History:   ?Social History  ? ?Socioeconomic History  ? Marital status: Single  ?  Spouse name: Not on file  ? Number of children: Not on file  ? Years of education: Not on file  ? Highest education level: Not on file  ?Occupational History  ? Not on file  ?Tobacco Use  ? Smoking status: Some Days  ?  Packs/day: 0.50  ?  Types: Cigarettes  ? Smokeless tobacco: Never  ?Vaping Use  ? Vaping Use: Every day  ?Substance and Sexual Activity  ? Alcohol use: Not Currently  ?  Comment: Refuses to disclose how much  ? Drug use: No  ?  Comment: Refuses to answer  ? Sexual activity: Never  ?  Comment: refused to answer  ?Other Topics Concern  ? Not on file  ?Social History Narrative  ? Not on file  ? ?Social Determinants of Health  ? ?Financial Resource Strain: Not on file  ?Food Insecurity: Not on file  ?Transportation Needs: Not on file  ?Physical Activity: Not on file  ?Stress: Not on file  ?Social Connections: Not on file  ?Intimate Partner Violence: Not on file  ?  ?Family History:   ?The patient does not know her family history. ? ?ROS:  ?Please see the history of present illness.  ? ?All other ROS reviewed and negative.    ? ?Physical Exam/Data:  ? ?Vitals:  ? 10/13/21 1220 10/13/21 1230 10/13/21 1250 10/13/21 1300  ?BP: 118/64 108/65 116/66 119/63  ?Pulse: 67 65    ?Resp: 15 13 12 10   ?Temp: 98 ?F (36.7 ?C)   98.1 ?F (36.7 ?C)  ?TempSrc:      ?SpO2: 100% 100% 100% 100%  ?Weight:      ?Height:      ? ? ?Intake/Output Summary (Last 24 hours) at 10/13/2021 1717 ?Last data filed at 10/13/2021 1100 ?Gross per 24 hour  ?Intake 240 ml  ?Output 1145 ml  ?Net -905 ml  ? ? ?  10/13/2021  ? 10:03 AM 10/12/2021  ?  5:00 AM 10/11/2021  ?  5:25 AM  ?Last 3 Weights  ?Weight (lbs) 97 lb 4.8 oz 97 lb 4.8 oz 98 lb 9.6 oz  ?Weight (kg) 44.135 kg 44.135 kg 44.725 kg  ?   ?Body mass index is 16.7 kg/m?.  ?General: Malnourished, in no acute distress ?HEENT: normal ?Neck: no JVD ?Vascular: No carotid bruits; Distal pulses 2+  bilaterally ?Cardiac:  normal S1, S2; RRR; no murmur  ?Lungs:  clear to auscultation bilaterally, no wheezing, rhonchi or rales  ?Abd: soft, nontender, no hepatomegaly  ?Ext: no edema ?Musculoskeletal:  No deformities, BUE and BLE strength normal and equal ?Skin: warm and dry  ?Neuro:  CNs 2-12 intact, no focal abnormalities noted ?Psych:  Normal affect  ? ?EKG:  The EKG was personally reviewed and demonstrates: Sinus rhythm with anterolateral T wave inversion suggestive of ischemia. ?Telemetry:  Telemetry was personally reviewed and  demonstrates: Wide-complex tachycardia at a rate of 170 bpm during ECT. ? ?Relevant CV Studies: ? ? ?Laboratory Data: ? ?High Sensitivity Troponin:  No results for input(s): TROPONINIHS in the last 720 hours.   ?Chemistry ?Recent Labs  ?Lab 10/07/21 ?0434 10/09/21 ?0624 10/13/21 ?1345  ?NA  --  136 138  ?K  --  4.2 3.8  ?CL  --  95* 99  ?CO2  --  33* 29  ?GLUCOSE  --  108* 93  ?BUN  --  22* 17  ?CREATININE  --  0.69 0.67  ?CALCIUM  --  9.3 8.9  ?MG 2.4 2.5*  --   ?GFRNONAA  --  >60 >60  ?ANIONGAP  --  8 10  ?  ?No results for input(s): PROT, ALBUMIN, AST, ALT, ALKPHOS, BILITOT in the last 168 hours. ?Lipids No results for input(s): CHOL, TRIG, HDL, LABVLDL, LDLCALC, CHOLHDL in the last 168 hours.  ?Hematology ?Recent Labs  ?Lab 10/09/21 ?0624  ?WBC 7.0  ?RBC 4.13  ?HGB 11.5*  ?HCT 36.3  ?MCV 87.9  ?MCH 27.8  ?MCHC 31.7  ?RDW 13.0  ?PLT 302  ? ?Thyroid No results for input(s): TSH, FREET4 in the last 168 hours.  ?BNPNo results for input(s): BNP, PROBNP in the last 168 hours.  ?DDimer No results for input(s): DDIMER in the last 168 hours. ? ? ?Radiology/Studies:  ?No results found. ? ? ?Assessment and Plan:  ? ?Wide-complex tachycardia likely sustained ventricular tachycardia during ECT treatment.  Her baseline EKG is not normal with anterolateral T wave inversion suggestive of ischemia or underlying structural heart abnormality.  She has not had any previous cardiac evaluation in the  past.  Ventricular arrhythmia can indicate myocardial stress or ischemia.  Thus, I am going to check troponin to ensure she did not have an ischemic event.  I agree with an echocardiogram to evaluate ejection fraction and

## 2021-10-13 NOTE — Procedures (Signed)
ECT SERVICES ?Physician?s Interval Evaluation & Treatment Note ? ?Patient Identification: [name] ?MRN:  [id number] ?Date of Evaluation:  10/13/2021 ?TX #: [id number] ? ?MADRS:  ? ?MMSE:  ? ?P.E. Findings: ? ?No change to physical exam ? ?Psychiatric Interval Note: ? ?Continues to be more awake alert and appropriate with better appetite ? ?Subjective: ? ?Patient is a 61 y.o. female seen for evaluation for Electroconvulsive Therapy. ?See previous ? ?Treatment Summary: ? ? []   Right Unilateral             [x]  Bilateral ?  ?% Energy : 1.0 ms 35% ? ? Impedance: 1450 ohms ? ?Seizure Energy Index: 21,035 ?V squared ? ?Postictal Suppression Index: 82% ? ?Seizure Concordance Index: 95% ? ?Medications ? ?Pre Shock: Brevital 40 mg succinylcholine 60 mg ? ?Post Shock:   ? ?Seizure Duration: 38 seconds EMG 54 seconds EEG ? ? ?Comments: ?Follow-up Wednesday and Friday continue treatment.  Patient had extended tachycardia during the treatment today and consultation was requested. ? ?Lungs: ? ?[x]   Clear to auscultation              ? ?[]  Other:  ? ?Heart: ?  ? [x]   Regular rhythm             []  irregular rhythm ? ? ? [x]   Previous H&P reviewed, patient examined and there are NO CHANGES              ?  ? []   Previous H&P reviewed, patient examined and there are changes noted. ? ? ?Alethia Berthold, MD ?3/27/20233:58 PM ? ?  ?

## 2021-10-13 NOTE — Progress Notes (Signed)
?  Progress Note ? ? ?Patient: [name] [id number] DOB: 1960/12/08 DOA: 10/02/2021     11 ?DOS: the patient was seen and examined on 10/13/2021 ?  ?Brief hospital course: ?61 year old female with history of paranoid schizophrenia, anemia was initially admitted to [facility] long hospital on 09/12/2021 the patient had a long hospitalization and the decision was made to transfer the patient on 10/02/2021 over at [facility] for ECT treatments for paranoid schizophrenia because she was not eating.  The patient had an NG tube placed.  The patient was treated for electrolyte abnormalities during the hospital course.  The patient had her first ECT treatment treatment on 10/06/2021.  We will have ECT treated treatments on Monday, Wednesday and Friday. ?3/25.  Patient condition much improved, she has good appetite, NG tube was pulled out 2 days ago. ?3/26.  Patient had a episode of ventricular tachycardia during the ECT.  Cardiology consult obtained ? ?Assessment and Plan: ?* Schizophrenia, paranoid (HCC) ?First ECT treatment on 10/08/2021.  Currently receiving ECT 3 times a week.  Per psychiatry, patient will need another week of treatment before discharge.  Condition had improved. ? ?Sinus bradycardia ?Condition has resolved. ? ?Hypokalemia, hypomagnesemia and hypophosphatemia ?Recheck BMP tomorrow. ? ? ?Protein-calorie malnutrition, severe (HCC) ?Patient has good appetite, continue protein supplements ? ?Anemia of chronic disease ?Hemoglobin stable. ? ?Ventricular tachycardia ?Patient had a prolonged ventricular tachycardia while he ECT treatment.  We will consult cardiology, recent echocardiogram showed normal ejection fraction without valvular disease, formal EKG pending.  Also monitor electrolytes. ? ?Hypotension ?Condition improved after giving midodrine. ? ? ? ? ?  ? ?Subjective:  ?Patient had a prolonged episode of ventricular tachycardia during the treatment of the ECT ?She denies any short of  breath or chest pain. ? ?Physical Exam: ?Vitals:  ? 10/13/21 1220 10/13/21 1230 10/13/21 1250 10/13/21 1300  ?BP: 118/64 108/65 116/66 119/63  ?Pulse: 67 65    ?Resp: 15 13 12 10   ?Temp: 98 ?F (36.7 ?C)   98.1 ?F (36.7 ?C)  ?TempSrc:      ?SpO2: 100% 100% 100% 100%  ?Weight:      ?Height:      ? ?General exam: Appears calm and comfortable  ?Respiratory system: Clear to auscultation. Respiratory effort normal. ?Cardiovascular system: S1 & S2 heard, RRR. No JVD, murmurs, rubs, gallops or clicks. No pedal edema. ?Gastrointestinal system: Abdomen is nondistended, soft and nontender. No organomegaly or masses felt. Normal bowel sounds heard. ?Central nervous system: Alert and oriented. No focal neurological deficits. ?Extremities: Symmetric 5 x 5 power. ?Skin: No rashes, lesions or ulcers ?Psychiatry: Judgement and insight appear normal. Mood & affect appropriate.  ? ?Data Reviewed: ? ?There are no new results to review at this time. ? ?Family Communication:  ? ?Disposition: ?Status is: Inpatient ?Remains inpatient appropriate because: Inpatient procedures ? Planned Discharge Destination: Home ? ? ? ?Time spent: 23 minutes ? ?Author: ? , MD ?10/13/2021 2:27 PM ? ?For on call review [email or web address].  ?

## 2021-10-13 NOTE — H&P (Signed)
[name] is an 61 y.o. female.   ?Chief Complaint: still not eating a lot ?HPI: schizophrenia ? ?Past Medical History:  ?Diagnosis Date  ? Anemia 10/02/2021  ? Non compliance w medication regimen   ? Schizophrenia (HCC)   ? ? ?Past Surgical History:  ?Procedure Laterality Date  ? BIOPSY  09/25/2021  ? Procedure: BIOPSY;  Surgeon: Rachael Fee, MD;  Location: [facility];  Service: Gastroenterology;;  ? ESOPHAGOGASTRODUODENOSCOPY (EGD) WITH PROPOFOL [name] 09/25/2021  ? Procedure: ESOPHAGOGASTRODUODENOSCOPY (EGD) WITH PROPOFOL;  Surgeon: Rachael Fee, MD;  Location: [facility];  Service: Gastroenterology;  Laterality: [name];  With NGT placement  ? ? ?History reviewed. No pertinent family history. ?Social History:  reports that she has been smoking cigarettes. She has been smoking an average of .5 packs per day. She has never used smokeless tobacco. She reports that she does not currently use alcohol. She reports that she does not use drugs. ? ?Allergies:  ?Allergies  ?Allergen Reactions  ? Aspirin Hives and Other (See Comments)  ?  Possibly caused hives, per sister  ? Ziprasidone Hcl Rash and Other (See Comments)  ?  Caused twitching  ? ? ?Medications Prior to Admission  ?Medication Sig Dispense Refill  ? benztropine (COGENTIN) 0.5 MG tablet Take 1 tablet (0.5 mg total) by mouth 2 (two) times daily as needed for tremors.    ? benztropine mesylate (COGENTIN) 1 MG/ML injection Inject 0.5 mLs (0.5 mg total) into the muscle 2 (two) times daily as needed for tremors. 2 mL   ? clonazePAM (KLONOPIN) 0.5 MG tablet Place 1 tablet (0.5 mg total) into feeding tube 2 (two) times daily. 30 tablet 0  ? mirtazapine (REMERON SOL-TAB) 15 MG disintegrating tablet Place 0.5 tablets (7.5 mg total) into feeding tube at bedtime. 30 tablet 0  ? Nutritional Supplements (FEEDING SUPPLEMENT, OSMOLITE 1.2 CAL,) LIQD Place 1,000 mLs into feeding tube continuous.  0  ? traZODone (DESYREL) 50 MG tablet Take 1 tablet (50 mg total) by mouth at  bedtime as needed for sleep. 30 tablet 0  ? Water For Irrigation, Sterile (FREE WATER) SOLN Place 120 mLs into feeding tube every 6 (six) hours.    ? [EXPIRED] glucagon, human recombinant, (GLUCAGEN) 1 MG injection Inject 0.5 mg into the muscle once for 1 dose. 1 g 0  ? LORazepam (ATIVAN) 2 MG/ML injection Inject 1 mL (2 mg total) into the muscle every 6 (six) hours as needed (agitation). 1 mL 0  ? ? ?Results for orders placed or performed during the hospital encounter of 10/02/21 (from the past 48 hour(s))  ?Glucose, capillary     Status: None  ? Collection Time: 10/12/21  5:34 AM  ?Result Value Ref Range  ? Glucose-Capillary 89 70 - 99 mg/dL  ?  Comment: Glucose reference range applies only to samples taken after fasting for at least 8 hours.  ? ?No results found. ? ?Review of Systems  ?Constitutional: Negative.   ?HENT: Negative.    ?Eyes: Negative.   ?Respiratory: Negative.    ?Cardiovascular: Negative.   ?Gastrointestinal: Negative.   ?Musculoskeletal: Negative.   ?Skin: Negative.   ?Neurological: Negative.   ?Psychiatric/Behavioral: Negative.    ? ?Blood pressure 92/63, pulse (!) 58, temperature 98.3 ?F (36.8 ?C), temperature source Tympanic, resp. rate 18, height 5\' 4"  (1.626 m), weight 44.1 kg, SpO2 98 %. ?Physical Exam ?Vitals and nursing note reviewed.  ?Constitutional:   ?   Appearance: She is well-developed.  ?HENT:  ?   Head:  Normocephalic and atraumatic.  ?Eyes:  ?   Conjunctiva/sclera: Conjunctivae normal.  ?   Pupils: Pupils are equal, round, and reactive to light.  ?Cardiovascular:  ?   Heart sounds: Normal heart sounds.  ?Pulmonary:  ?   Effort: Pulmonary effort is normal.  ?Abdominal:  ?   Palpations: Abdomen is soft.  ?Musculoskeletal:     ?   General: Normal range of motion.  ?   Cervical back: Normal range of motion.  ?Skin: ?   General: Skin is warm and dry.  ?Neurological:  ?   Mental Status: She is alert.  ?Psychiatric:     ?   Mood and Affect: Affect is blunt.     ?   Speech: Speech is  delayed.     ?   Behavior: Behavior is slowed.     ?   Thought Content: Thought content does not include suicidal ideation.  ?  ? ?Assessment/Plan ?Continue ect and medication ? ?Mordecai Rasmussen, MD ?10/13/2021, 10:09 AM ? ? ? ?

## 2021-10-13 NOTE — Assessment & Plan Note (Addendum)
Patient had prolonged ventricular tachycardia while he ECT treatment.  Has been seen by cardiology, EKG did not have significant QT interval prolongation, echocardiogram showed ejection fraction 60 to 65% without valvular disease.  Troponin negative.  Cardiology has scheduled a stress test for tomorrow.  They will decide if patient can have more ECT treatment. ?

## 2021-10-13 NOTE — Anesthesia Procedure Notes (Signed)
Procedure Name: General with mask airway ?Date/Time: 10/13/2021 11:27 AM ?Performed by: Jerrye Noble, CRNA ?Pre-anesthesia Checklist: Patient identified, Emergency Drugs available, Suction available and Patient being monitored ?Patient Re-evaluated:Patient Re-evaluated prior to induction ?Oxygen Delivery Method: Circle system utilized ?Preoxygenation: Pre-oxygenation with 100% oxygen ? ? ? ? ?

## 2021-10-13 NOTE — Anesthesia Preprocedure Evaluation (Signed)
Anesthesia Evaluation  ?Patient identified by MRN, date of birth, ID band ?Patient awake ? ? ? ?Reviewed: ?Allergy & Precautions, NPO status , Patient's Chart, lab work & pertinent test results ? ?History of Anesthesia Complications ?Negative for: history of anesthetic complications ? ?Airway ?Mallampati: IV ? ? ?Neck ROM: Full ? ? ? Dental ? ? ?Missing upper teeth x2:   ?Pulmonary ?Current Smoker and Patient abstained from smoking.,  ?  ?Pulmonary exam normal ?breath sounds clear to auscultation ? ? ? ? ? ? Cardiovascular ?Normal cardiovascular exam ?Rhythm:Regular Rate:Normal ? ?ECG 10/02/21:  ?Normal sinus rhythm ?T wave abnormality, consider anterolateral ischemia ?  ?Neuro/Psych ?PSYCHIATRIC DISORDERS Schizophrenia negative neurological ROS ?   ? GI/Hepatic ?negative GI ROS,   ?Endo/Other  ?negative endocrine ROS ? Renal/GU ?negative Renal ROS  ? ?  ?Musculoskeletal ? ? Abdominal ?  ?Peds ? Hematology ? ?(+) Blood dyscrasia, anemia ,   ?Anesthesia Other Findings ? ? Reproductive/Obstetrics ? ?  ? ? ? ? ? ? ? ? ? ? ? ? ? ?  ?  ? ? ? ? ? ? ? ?Anesthesia Physical ?Anesthesia Plan ? ?ASA: 3 ? ?Anesthesia Plan: General  ? ?Post-op Pain Management:   ? ?Induction: Intravenous ? ?PONV Risk Score and Plan: 2 and TIVA and Treatment may vary due to age or medical condition ? ?Airway Management Planned: Natural Airway ? ?Additional Equipment:  ? ?Intra-op Plan:  ? ?Post-operative Plan:  ? ?Informed Consent: I have reviewed the patients History and Physical, chart, labs and discussed the procedure including the risks, benefits and alternatives for the proposed anesthesia with the patient or authorized representative who has indicated his/her understanding and acceptance.  ? ? ? ? ? ?Plan Discussed with: CRNA ? ?Anesthesia Plan Comments: (LMA/GETA backup discussed.  Serial consent on chart.  Patient consented for risks of anesthesia including but not limited to:  ?- adverse reactions to  medications ?- damage to eyes, teeth, lips or other oral mucosa ?- nerve damage due to positioning  ?- sore throat or hoarseness ?- damage to heart, brain, nerves, lungs, other parts of body or loss of life ? ?Informed patient about role of CRNA in peri- and intra-operative care.  Patient voiced understanding.)  ? ? ? ? ? ?Anesthesia Quick Evaluation ? ?

## 2021-10-13 NOTE — Transfer of Care (Signed)
Immediate Anesthesia Transfer of Care Note ? ?Patient: [name] ? ?Procedure(s) Performed: ECT TX ? ?Patient Location: PACU ? ?Anesthesia Type:General ? ?Level of Consciousness: awake, drowsy and patient cooperative ? ?Airway & Oxygen Therapy: Patient Spontanous Breathing and Patient connected to face mask oxygen ? ?Post-op Assessment: Report given to RN and Post -op Vital signs reviewed and stable ? ?Post vital signs: Reviewed and stable ? ?Last Vitals:  ?Vitals Value Taken Time  ?BP 100/77 10/13/21 1145  ?Temp    ?Pulse 78 10/13/21 1147  ?Resp 17 10/13/21 1147  ?SpO2 100 % 10/13/21 1147  ?Vitals shown include unvalidated device data. ? ?Last Pain:  ?Vitals:  ? 10/13/21 1005  ?TempSrc:   ?PainSc: 0-No pain  ?   ? ?  ? ?Complications: No notable events documented. ?

## 2021-10-14 ENCOUNTER — Inpatient Hospital Stay (HOSPITAL_COMMUNITY)
Admission: AD | Admit: 2021-10-14 | Discharge: 2021-10-14 | Disposition: A | Discharge status: Home / Self Care | Payer: Medicare Other | Source: Other Acute Inpatient Hospital | Attending: Internal Medicine | Admitting: Internal Medicine

## 2021-10-14 DIAGNOSIS — I472 Ventricular tachycardia, unspecified: Secondary | ICD-10-CM

## 2021-10-14 DIAGNOSIS — R001 Bradycardia, unspecified: Secondary | ICD-10-CM

## 2021-10-14 LAB — ECHOCARDIOGRAM COMPLETE
AR max vel: 2.75 cm2
AV Area VTI: 2.42 cm2
AV Area mean vel: 2.29 cm2
AV Mean grad: 4 mmHg
AV Peak grad: 6.8 mmHg
Ao pk vel: 1.3 m/s
Area-P 1/2: 4.17 cm2
Height: 64 in
MV VTI: 2.95 cm2
Weight: 1576.73 oz

## 2021-10-14 LAB — PHOSPHORUS: Phosphorus: 4.9 mg/dL — ABNORMAL HIGH (ref 2.5–4.6)

## 2021-10-14 LAB — CBC
HCT: 34.4 % — ABNORMAL LOW (ref 36.0–46.0)
Hemoglobin: 10.7 g/dL — ABNORMAL LOW (ref 12.0–15.0)
MCH: 28 pg (ref 26.0–34.0)
MCHC: 31.1 g/dL (ref 30.0–36.0)
MCV: 90.1 fL (ref 80.0–100.0)
Platelets: 310 10*3/uL (ref 150–400)
RBC: 3.82 MIL/uL — ABNORMAL LOW (ref 3.87–5.11)
RDW: 12.9 % (ref 11.5–15.5)
WBC: 4.9 10*3/uL (ref 4.0–10.5)
nRBC: 0 % (ref 0.0–0.2)

## 2021-10-14 LAB — BASIC METABOLIC PANEL
Anion gap: 10 (ref 5–15)
BUN: 20 mg/dL (ref 6–20)
CO2: 27 mmol/L (ref 22–32)
Calcium: 8.9 mg/dL (ref 8.9–10.3)
Chloride: 100 mmol/L (ref 98–111)
Creatinine, Ser: 0.68 mg/dL (ref 0.44–1.00)
GFR, Estimated: >60 mL/min (ref >60)
Glucose, Bld: 92 mg/dL (ref 70–99)
Potassium: 4.4 mmol/L (ref 3.5–5.1)
Sodium: 137 mmol/L (ref 135–145)

## 2021-10-14 LAB — GLUCOSE, CAPILLARY: Glucose-Capillary: 107 mg/dL — ABNORMAL HIGH (ref 70–99)

## 2021-10-14 LAB — MAGNESIUM: Magnesium: 2.5 mg/dL — ABNORMAL HIGH (ref 1.7–2.4)

## 2021-10-14 NOTE — Plan of Care (Signed)
  Problem: Education: Goal: Knowledge of General Education information will improve Description: Including pain rating scale, medication(s)/side effects and non-pharmacologic comfort measures Outcome: Progressing   Problem: Health Behavior/Discharge Planning: Goal: Ability to manage health-related needs will improve Outcome: Progressing   Problem: Clinical Measurements: Goal: Ability to maintain clinical measurements within normal limits will improve Outcome: Progressing Goal: Will remain free from infection Outcome: Progressing Goal: Diagnostic test results will improve Outcome: Progressing Goal: Respiratory complications will improve Outcome: Progressing Goal: Cardiovascular complication will be avoided Outcome: Progressing   Problem: Elimination: Goal: Will not experience complications related to bowel motility Outcome: Progressing Goal: Will not experience complications related to urinary retention Outcome: Progressing   

## 2021-10-14 NOTE — Progress Notes (Signed)
[facility] MD Progress Note ? ?10/14/2021 4:40 PM ?[name]  ?MRN:  [id number] ?Subjective: Follow-up for 61 year old woman with schizophrenia being treated for catatonia.  Patient seen in her room.  She was awake and alert.  Able to remember what she ate for lunch.  Denied any emotional problems.  Did not appear to be preoccupied by internal stimuli.  No physical complaints.  Patient had ECT on Monday but had a an episode of sustained pulsatile ventricular tachycardia.  Work-up is proceeding at this time with planned stress test tomorrow. ?Principal Problem: Schizophrenia, paranoid (HCC) ?Diagnosis: Principal Problem: ?  Schizophrenia, paranoid (HCC) ?Active Problems: ?  Hypotension ?  Hypokalemia, hypomagnesemia and hypophosphatemia ?  Protein-calorie malnutrition, severe (HCC) ?  Anemia of chronic disease ?  Sinus bradycardia ?  Ventricular tachycardia ? ?Total Time spent with patient: 30 minutes ? ?Past Psychiatric History: Past history of longstanding chronic schizophrenia ? ?Past Medical History:  ?Past Medical History:  ?Diagnosis Date  ? Anemia 10/02/2021  ? Non compliance w medication regimen   ? Schizophrenia (HCC)   ?  ?Past Surgical History:  ?Procedure Laterality Date  ? BIOPSY  09/25/2021  ? Procedure: BIOPSY;  Surgeon: Rachael Fee, MD;  Location: [facility];  Service: Gastroenterology;;  ? ESOPHAGOGASTRODUODENOSCOPY (EGD) WITH PROPOFOL [name] 09/25/2021  ? Procedure: ESOPHAGOGASTRODUODENOSCOPY (EGD) WITH PROPOFOL;  Surgeon: Rachael Fee, MD;  Location: [facility];  Service: Gastroenterology;  Laterality: [name];  With NGT placement  ? ?Family History: History reviewed. No pertinent family history. ?Family Psychiatric  History: See previous ?Social History:  ?Social History  ? ?Substance and Sexual Activity  ?Alcohol Use Not Currently  ? Comment: Refuses to disclose how much  ?   ?Social History  ? ?Substance and Sexual Activity  ?Drug Use No  ? Comment: Refuses to answer  ?  ?Social History   ? ?Socioeconomic History  ? Marital status: Single  ?  Spouse name: Not on file  ? Number of children: Not on file  ? Years of education: Not on file  ? Highest education level: Not on file  ?Occupational History  ? Not on file  ?Tobacco Use  ? Smoking status: Some Days  ?  Packs/day: 0.50  ?  Types: Cigarettes  ? Smokeless tobacco: Never  ?Vaping Use  ? Vaping Use: Every day  ?Substance and Sexual Activity  ? Alcohol use: Not Currently  ?  Comment: Refuses to disclose how much  ? Drug use: No  ?  Comment: Refuses to answer  ? Sexual activity: Never  ?  Comment: refused to answer  ?Other Topics Concern  ? Not on file  ?Social History Narrative  ? Not on file  ? ?Social Determinants of Health  ? ?Financial Resource Strain: Not on file  ?Food Insecurity: Not on file  ?Transportation Needs: Not on file  ?Physical Activity: Not on file  ?Stress: Not on file  ?Social Connections: Not on file  ? ?Additional Social History:  ?  ?  ?  ?  ?  ?  ?  ?  ?  ?  ?  ? ?Sleep: Fair ? ?Appetite:  Fair ? ?Current Medications: ?Current Facility-Administered Medications  ?Medication Dose Route Frequency Provider Last Rate Last Admin  ? 0.9 %  sodium chloride infusion  500 mL Intravenous Once Brevon Dewald T, MD      ? acetaminophen (TYLENOL) tablet 650 mg  650 mg Oral Q6H PRN Gertha Calkin, MD      ?  Or  ? acetaminophen (TYLENOL) suppository 650 mg  650 mg Rectal Q6H PRN Gertha Calkin, MD      ? atropine 1 MG/10ML injection 1 mg  1 mg Intravenous Q5 Min x 3 PRN Manuela Schwartz, NP      ? benztropine (COGENTIN) tablet 0.5 mg  0.5 mg Oral BID PRN Alford Highland, MD      ? clonazePAM Scarlette Calico) disintegrating tablet 0.25 mg  0.25 mg Oral BID Ronnald Ramp, RPH   0.25 mg at 10/14/21 5409  ? enoxaparin (LOVENOX) injection 30 mg  30 mg Subcutaneous Q24H Ronnald Ramp, RPH   30 mg at 10/13/21 2115  ? feeding supplement (BOOST / RESOURCE BREEZE) liquid 1 Container  1 Container Oral TID BM Marrion Coy, MD   1 Container at 10/14/21 1340   ? haloperidol (HALDOL) tablet 5 mg  5 mg Oral BID Ronnald Ramp, RPH   5 mg at 10/14/21 8119  ? haloperidol lactate (HALDOL) injection 5 mg  5 mg Intravenous Once Tai Skelly, [name], MD      ? MEDLINE mouth rinse  15 mL Mouth Rinse q12n4p Alford Highland, MD   15 mL at 10/14/21 1550  ? menthol-cetylpyridinium (CEPACOL) lozenge 3 mg  1 lozenge Oral PRN Alford Highland, MD      ? midodrine (PROAMATINE) tablet 2.5 mg  2.5 mg Oral TID WC Marrion Coy, MD   2.5 mg at 10/14/21 1206  ? mirtazapine (REMERON SOL-TAB) disintegrating tablet 7.5 mg  7.5 mg Per Tube QHS Alford Highland, MD   7.5 mg at 10/13/21 2112  ? multivitamin with minerals tablet 1 tablet  1 tablet Per Tube Daily Gertha Calkin, MD   1 tablet at 10/14/21 1478  ? thiamine tablet 100 mg  100 mg Oral Daily Ronnald Ramp, RPH   100 mg at 10/14/21 2956  ? traZODone (DESYREL) tablet 50 mg  50 mg Oral QHS PRN Alford Highland, MD      ? ? ?Lab Results:  ?Results for orders placed or performed during the hospital encounter of 10/02/21 (from the past 48 hour(s))  ?Basic metabolic panel     Status: None  ? Collection Time: 10/13/21  1:45 PM  ?Result Value Ref Range  ? Sodium 138 135 - 145 mmol/L  ? Potassium 3.8 3.5 - 5.1 mmol/L  ? Chloride 99 98 - 111 mmol/L  ? CO2 29 22 - 32 mmol/L  ? Glucose, Bld 93 70 - 99 mg/dL  ?  Comment: Glucose reference range applies only to samples taken after fasting for at least 8 hours.  ? BUN 17 6 - 20 mg/dL  ? Creatinine, Ser 0.67 0.44 - 1.00 mg/dL  ? Calcium 8.9 8.9 - 10.3 mg/dL  ? GFR, Estimated >60 >60 mL/min  ?  Comment: (NOTE) ?Calculated using the CKD-EPI Creatinine Equation (2021) ?  ? Anion gap 10 5 - 15  ?  Comment: Performed at [facility], [address] [place], [place] [address]  ?Troponin I (High Sensitivity)     Status: None  ? Collection Time: 10/13/21  5:59 PM  ?Result Value Ref Range  ? Troponin I (High Sensitivity) 5 <18 ng/L  ?  Comment: (NOTE) ?Elevated high sensitivity troponin I (hsTnI) values  and significant  ?changes across serial measurements may suggest ACS but many other  ?chronic and acute conditions are known to elevate hsTnI results.  ?Refer to the "Links" section for chest pain algorithms and additional  ?guidance. ?Performed  at [facility], [address] [place], ?[place] [id number] ?  ?Troponin I (High Sensitivity)     Status: None  ? Collection Time: 10/13/21  8:12 PM  ?Result Value Ref Range  ? Troponin I (High Sensitivity) 4 <18 ng/L  ?  Comment: (NOTE) ?Elevated high sensitivity troponin I (hsTnI) values and significant  ?changes across serial measurements may suggest ACS but many other  ?chronic and acute conditions are known to elevate hsTnI results.  ?Refer to the "Links" section for chest pain algorithms and additional  ?guidance. ?Performed at [facility], [address] [place], ?[place] [id number] ?  ?Glucose, capillary     Status: Abnormal  ? Collection Time: 10/14/21  5:09 AM  ?Result Value Ref Range  ? Glucose-Capillary 107 (H) 70 - 99 mg/dL  ?  Comment: Glucose reference range applies only to samples taken after fasting for at least 8 hours.  ?CBC     Status: Abnormal  ? Collection Time: 10/14/21  5:18 AM  ?Result Value Ref Range  ? WBC 4.9 4.0 - 10.5 K/uL  ? RBC 3.82 (L) 3.87 - 5.11 MIL/uL  ? Hemoglobin 10.7 (L) 12.0 - 15.0 g/dL  ? HCT 34.4 (L) 36.0 - 46.0 %  ? MCV 90.1 80.0 - 100.0 fL  ? MCH 28.0 26.0 - 34.0 pg  ? MCHC 31.1 30.0 - 36.0 g/dL  ? RDW 12.9 11.5 - 15.5 %  ? Platelets 310 150 - 400 K/uL  ? nRBC 0.0 0.0 - 0.2 %  ?  Comment: Performed at [facility], [address] [place], [place] [address]  ?Basic metabolic panel     Status: None  ? Collection Time: 10/14/21  5:18 AM  ?Result Value Ref Range  ? Sodium 137 135 - 145 mmol/L  ? Potassium 4.4 3.5 - 5.1 mmol/L  ? Chloride 100 98 - 111 mmol/L  ? CO2 27 22 - 32 mmol/L  ? Glucose, Bld 92 70 - 99 mg/dL  ?  Comment: Glucose reference range applies only to samples taken after fasting for at  least 8 hours.  ? BUN 20 6 - 20 mg/dL  ? Creatinine, Ser 0.68 0.44 - 1.00 mg/dL  ? Calcium 8.9 8.9 - 10.3 mg/dL  ? GFR, Estimated >60 >60 mL/min  ?  Comment: (NOTE) ?Calculated using the CKD-EPI Creatinine Equation (20

## 2021-10-14 NOTE — Progress Notes (Signed)
?  Progress Note ? ? ?Patient: [name] ELF:[id number] DOB: 1960-10-15 DOA: 10/02/2021     12 ?DOS: the patient was seen and examined on 10/14/2021 ?  ?Brief hospital course: ?61 year old female with history of paranoid schizophrenia, anemia was initially admitted to [facility] hospital on 09/12/2021 the patient had a long hospitalization and the decision was made to transfer the patient on 10/02/2021 over at [facility] for ECT treatments for paranoid schizophrenia because she was not eating.  The patient had an NG tube placed.  The patient was treated for electrolyte abnormalities during the hospital course.  The patient had her first ECT treatment treatment on 10/06/2021.  We will have ECT treated treatments on Monday, Wednesday and Friday. ?3/25.  Patient condition much improved, she has good appetite, NG tube was pulled out 2 days ago. ?3/26.  Patient had a episode of ventricular tachycardia during the ECT.  Cardiology consult obtained ? ?Assessment and Plan: ?* Schizophrenia, paranoid (HCC) ?First ECT treatment on 10/08/2021.  Currently receiving ECT 3 times a week.  Had a persistent ventricular tachycardia 3/27 during the session of ECT.  Pending further decision about additional treatment. ? ?Sinus bradycardia ?Condition has resolved. ? ?Hypokalemia, hypomagnesemia and hypophosphatemia ?Recheck BMP tomorrow. ? ? ?Protein-calorie malnutrition, severe (HCC) ?Patient has good appetite, continue protein supplements ? ?Anemia of chronic disease ?Hemoglobin stable. ? ?Ventricular tachycardia ?Patient had prolonged ventricular tachycardia while he ECT treatment.  Has been seen by cardiology, EKG did not have significant QT interval prolongation, echocardiogram showed ejection fraction 60 to 65% without valvular disease.  Troponin negative.  Cardiology has scheduled a stress test for tomorrow.  They will decide if patient can have more ECT treatment. ? ?Hypotension ?Condition improved after giving  midodrine. ? ? ? ? ?  ? ?Subjective: Patient doing well today, no confusion, good appetite without nausea vomiting. ? ?Physical Exam: ?Vitals:  ? 10/14/21 0059 10/14/21 0414 10/14/21 0839 10/14/21 1123  ?BP: (!) 89/54 (!) 91/53 (!) 88/62 102/69  ?Pulse: (!) 59 (!) 52 (!) 56 75  ?Resp: 16 14 17 17   ?Temp: 98.3 ?F (36.8 ?C) 97.9 ?F (36.6 ?C) 97.8 ?F (36.6 ?C) 97.9 ?F (36.6 ?C)  ?TempSrc:   Oral Oral  ?SpO2: 100% 98% 99% 98%  ?Weight:  44.7 kg    ?Height:      ? ?General exam: Appears calm and comfortable  ?Respiratory system: Clear to auscultation. Respiratory effort normal. ?Cardiovascular system: S1 & S2 heard, RRR. No JVD, murmurs, rubs, gallops or clicks. No pedal edema. ?Gastrointestinal system: Abdomen is nondistended, soft and nontender. No organomegaly or masses felt. Normal bowel sounds heard. ?Central nervous system: Alert and oriented. No focal neurological deficits. ?Extremities: Symmetric 5 x 5 power. ?Skin: No rashes, lesions or ulcers ?Psychiatry: Judgement and insight appear normal. Mood & affect appropriate.  ? ?Data Reviewed: ? ?Labs reviewed ? ?Family Communication:  ? ?Disposition: ?Status is: Inpatient ?Remains inpatient appropriate because: Inpatient procedure ? Planned Discharge Destination: Home ? ? ? ?Time spent: 23 minutes ? ?Author: ? , MD ?10/14/2021 1:55 PM ? ?For on call review [email or web address].  ?

## 2021-10-14 NOTE — Progress Notes (Signed)
Physical Therapy Treatment ?Patient Details ?Name: [name] ?MRN: [id number] ?DOB: 10-18-1960 ?Today's Date: 10/14/2021 ? ? ?History of Present Illness [name] is a 39yoF who comes to [facility] direct transfer from [facility] for ECT therapy. At home pt was experiencing hallucinations and delusional thoughts, known history of paranoid schizophrenia and recent medication noncompliance. Pt was not eating or drinking at home. ? ?  ?PT Comments  ? ? Pt resting in bed upon PT arrival; pt agreeable to PT session.  During session pt modified independent with bed mobility; independent with transfers; and SBA to CGA ambulating 200 feet (no AD use).   During ambulation, pt with very mild drifting at times (close SBA for safety) but towards end of ambulation pt with 1 loss of balance towards L but pt able to self correct (CGA provided for safety).  Pt's HR 69-83 bpm during sessions activities.  Will continue to focus on strengthening, balance, and progressive functional mobility during hospitalization. ?  ?Recommendations for follow up therapy are one component of a multi-disciplinary discharge planning process, led by the attending physician.  Recommendations may be updated based on patient status, additional functional criteria and insurance authorization. ? ?Follow Up Recommendations ? No PT follow up ?  ?  ?Assistance Recommended at Discharge Intermittent Supervision/Assistance  ?Patient can return home with the following A little help with walking and/or transfers;Assistance with cooking/housework;Help with stairs or ramp for entrance ?  ?Equipment Recommendations ? Rolling walker (2 wheels)  ?  ?Recommendations for Other Services   ? ? ?  ?Precautions / Restrictions Precautions ?Precautions: Fall ?Restrictions ?Weight Bearing Restrictions: No  ?  ? ?Mobility ? Bed Mobility ?Overal bed mobility: Modified Independent ?  ?  ?  ?  ?  ?  ?General bed mobility comments: Semi-supine to/from sitting without any noted difficulty ?   ? ?Transfers ?Overall transfer level: Independent ?Equipment used: None ?Transfers: Sit to/from Stand ?Sit to Stand: Independent ?  ?  ?  ?  ?  ?General transfer comment: steady safe transfers noted ?  ? ?Ambulation/Gait ?Ambulation/Gait assistance: Supervision, Min guard ?Gait Distance (Feet): 200 Feet ?Assistive device: None ?  ?Gait velocity: decreased ?  ?  ?General Gait Details: very mild drifting at times (close SBA for safety) but towards end of ambulation pt with 1 loss of balance towards L but pt able to self correct (CGA provided for safety) ? ? ?Stairs ?  ?  ?  ?  ?  ? ? ?Wheelchair Mobility ?  ? ?Modified Rankin (Stroke Patients Only) ?  ? ? ?  ?Balance Overall balance assessment: Needs assistance ?Sitting-balance support: No upper extremity supported, Feet supported ?Sitting balance-Leahy Scale: Normal ?Sitting balance - Comments: steady sitting reaching outside BOS ?  ?Standing balance support: No upper extremity supported ?Standing balance-Leahy Scale: Good ?Standing balance comment: steady standing reaching within BOS ?  ?  ?  ?  ?  ?  ?  ?  ?  ?  ?  ?  ? ?  ?Cognition Arousal/Alertness: Awake/alert ?Behavior During Therapy: [facility] for tasks assessed/performed ?Overall Cognitive Status: Within Functional Limits for tasks assessed ?  ?  ?  ?  ?  ?  ?  ?  ?  ?  ?  ?  ?  ?  ?  ?  ?General Comments: Pleasant ?  ?  ? ?  ?Exercises General Exercises - Lower Extremity ?Long Arc Quad: AROM, Strengthening, Both, 10 reps, Seated ?Hip Flexion/Marching: AROM, Strengthening, Both, 10 reps, Seated ? ?  ?  General Comments  Nursing cleared pt for participation in physical therapy.  Pt agreeable to PT session. ?  ?  ? ?Pertinent Vitals/Pain Pain Assessment ?Pain Assessment: Faces ?Faces Pain Scale: No hurt ?Pain Intervention(s): Limited activity within patient's tolerance, Monitored during session, Repositioned ?Vitals (HR and O2 on room air) stable and WFL throughout treatment session.  ? ? ?Home Living   ?  ?  ?  ?  ?   ?  ?  ?  ?  ?   ?  ?Prior Function    ?  ?  ?   ? ?PT Goals (current goals can now be found in the care plan section) Acute Rehab PT Goals ?Patient Stated Goal: go home ?PT Goal Formulation: Patient unable to participate in goal setting ?Progress towards PT goals: Progressing toward goals ? ?  ?Frequency ? ? ? Min 2X/week ? ? ? ?  ?PT Plan Current plan remains appropriate  ? ? ?Co-evaluation   ?  ?  ?  ?  ? ?  ?AM-PAC PT "6 Clicks" Mobility   ?Outcome Measure ? Help needed turning from your back to your side while in a flat bed without using bedrails?: None ?Help needed moving from lying on your back to sitting on the side of a flat bed without using bedrails?: None ?Help needed moving to and from a bed to a chair (including a wheelchair)?: None ?Help needed standing up from a chair using your arms (e.g., wheelchair or bedside chair)?: None ?Help needed to walk in hospital room?: A Little ?Help needed climbing 3-5 steps with a railing? : A Little ?6 Click Score: 22 ? ?  ?End of Session Equipment Utilized During Treatment: Gait belt ?Activity Tolerance: Patient tolerated treatment well ?Patient left: in bed;with call bell/phone within reach;with bed alarm set ?Nurse Communication: Mobility status;Precautions ?PT Visit Diagnosis: Difficulty in walking, not elsewhere classified (R26.2);Unsteadiness on feet (R26.81);Muscle weakness (generalized) (M62.81) ?  ? ? ?Time: [id number] ?PT Time Calculation (min) (ACUTE ONLY): 10 min ? ?Charges:  $Gait Training: 8-22 mins          ?          ?Leitha Bleak, PT ?10/14/21, 12:14 PM ? ? ?

## 2021-10-14 NOTE — Anesthesia Postprocedure Evaluation (Signed)
Anesthesia Post Note ? ?Patient: [name] ? ?Procedure(s) Performed: ECT TX ? ?Patient location during evaluation: PACU ?Anesthesia Type: General ?Level of consciousness: awake and alert ?Pain management: pain level controlled ?Vital Signs Assessment: post-procedure vital signs reviewed and stable ?Respiratory status: spontaneous breathing, nonlabored ventilation, respiratory function stable and patient connected to nasal cannula oxygen ?Cardiovascular status: blood pressure returned to baseline and stable ?Postop Assessment: no apparent nausea or vomiting ?Anesthetic complications: no ? ? ?No notable events documented. ? ? ?Last Vitals:  ?Vitals:  ? 10/14/21 1123 10/14/21 1551  ?BP: 102/69 (!) 91/56  ?Pulse: 75 60  ?Resp: 17 18  ?Temp: 36.6 ?C 36.6 ?C  ?SpO2: 98% 98%  ?  ?Last Pain:  ?Vitals:  ? 10/14/21 1551  ?TempSrc: Oral  ?PainSc:   ? ? ?  ?  ?  ?  ?  ?  ? ?Lenard Simmer ? ? ? ? ?

## 2021-10-14 NOTE — Progress Notes (Signed)
? ? ?Progress Note ? ?Patient Name: [name] ?Date of Encounter: 10/14/2021 ? ?Primary Cardiologist: New to [facility] - consult by Kirke Corin ? ?Subjective  ? ?No chest pain, dyspnea, palpitations, dizziness, presyncope, or syncope. Echo is pending.  ? ?Inpatient Medications  ?  ?Scheduled Meds: ? clonazepam  0.25 mg Oral BID  ? enoxaparin (LOVENOX) injection  30 mg Subcutaneous Q24H  ? feeding supplement  1 Container Oral TID BM  ? haloperidol  5 mg Oral BID  ? haloperidol lactate  5 mg Intravenous Once  ? mouth rinse  15 mL Mouth Rinse q12n4p  ? midodrine  2.5 mg Oral TID WC  ? mirtazapine  7.5 mg Per Tube QHS  ? multivitamin with minerals  1 tablet Per Tube Daily  ? thiamine  100 mg Oral Daily  ? ?Continuous Infusions: ? sodium chloride    ? ?PRN Meds: ?acetaminophen **OR** acetaminophen, atropine, benztropine, menthol-cetylpyridinium, traZODone  ? ?Vital Signs  ?  ?Vitals:  ? 10/13/21 1718 10/13/21 2006 10/14/21 0059 10/14/21 0414  ?BP: 106/65 97/67 (!) 89/54 (!) 91/53  ?Pulse: (!) 59 75 (!) 59 (!) 52  ?Resp: 14 16 16 14   ?Temp: 98.5 ?F (36.9 ?C) 98.3 ?F (36.8 ?C) 98.3 ?F (36.8 ?C) 97.9 ?F (36.6 ?C)  ?TempSrc:  Oral    ?SpO2: 96% 98% 100% 98%  ?Weight:    44.7 kg  ?Height:      ? ? ?Intake/Output Summary (Last 24 hours) at 10/14/2021 0810 ?Last data filed at 10/13/2021 1100 ?Gross per 24 hour  ?Intake --  ?Output 1145 ml  ?Net -1145 ml  ? ?Filed Weights  ? 10/12/21 0500 10/13/21 1003 10/14/21 0414  ?Weight: 44.1 kg 44.1 kg 44.7 kg  ? ? ?Telemetry  ?  ?Sinus bradycardia, 40s to 50s bpm - Personally Reviewed ? ?ECG  ?  ?No new tracings - Personally Reviewed ? ?Physical Exam  ? ?GEN: No acute distress.   ?Neck: No JVD. ?Cardiac: Bradycardic, no murmurs, rubs, or gallops.  ?Respiratory: Clear to auscultation bilaterally.  ?GI: Soft, nontender, non-distended.   ?MS: No edema; No deformity. ?Neuro:  Alert; Nonfocal.  ?Psych: Flat affect. ? ?Labs  ?  ?Chemistry ?Recent Labs  ?Lab 10/09/21 ?0624 10/13/21 ?1345 10/14/21 ?0518   ?NA 136 138 137  ?K 4.2 3.8 4.4  ?CL 95* 99 100  ?CO2 33* 29 27  ?GLUCOSE 108* 93 92  ?BUN 22* 17 20  ?CREATININE 0.69 0.67 0.68  ?CALCIUM 9.3 8.9 8.9  ?GFRNONAA >60 >60 >60  ?ANIONGAP 8 10 10   ?  ? ?Hematology ?Recent Labs  ?Lab 10/09/21 ?0624 10/14/21 ?0518  ?WBC 7.0 4.9  ?RBC 4.13 3.82*  ?HGB 11.5* 10.7*  ?HCT 36.3 34.4*  ?MCV 87.9 90.1  ?MCH 27.8 28.0  ?MCHC 31.7 31.1  ?RDW 13.0 12.9  ?PLT 302 310  ? ? ?Cardiac EnzymesNo results for input(s): TROPONINI in the last 168 hours. No results for input(s): TROPIPOC in the last 168 hours.  ? ?BNPNo results for input(s): BNP, PROBNP in the last 168 hours.  ? ?DDimer No results for input(s): DDIMER in the last 168 hours.  ? ?Radiology  ?  ?No results found. ? ?Cardiac Studies  ? ?2D echo 03/27/2020: ?1. Left ventricular ejection fraction, by estimation, is 60 to 65%. The  ?left ventricle has normal function. The left ventricle has no regional  ?wall motion abnormalities. Left ventricular diastolic parameters were  ?normal.  ? 2. Right ventricular systolic function is normal. The right ventricular  ?  size is normal. There is normal pulmonary artery systolic pressure.  ? 3. Right atrial size was mildly dilated.  ? 4. The mitral valve is normal in structure. Trivial mitral valve  ?regurgitation. No evidence of mitral stenosis.  ? 5. The aortic valve is normal in structure. Aortic valve regurgitation is  ?not visualized. No aortic stenosis is present.  ? 6. The inferior vena cava is normal in size with greater than 50%  ?respiratory variability, suggesting right atrial pressure of 3 mmHg. ?__________ ? ?2D echo pending ? ?Patient Profile  ?   ?61 y.o. female with history of paranoid schizophrenia and protein calorie malnutrition who was seen in consult on 3/27 for VT in the setting of ECT. ? ?Assessment & Plan  ?  ?1. Wide complex tachycardia: ?-No further episodes noted on tele ?-High sensitivity troponin negative x 2 ?-Prior echo with preserved LV systolic function ?-Echo  pending ?-Unable to add beta blocker or amiodarone at this time given underlying sinus bradycardia ?-Possibly in the setting of catecholamine response with ECT ?-If echo demonstrates preserved LVSF, would plan for Lexiscan MPI at some point ?-Magnesium and potassium at goal ?-TSH and free T4 normal ? ?   ? ?For questions or updates, please contact [facility] ?Please consult [email or web address] for contact info under Cardiology/STEMI. ?  ? ?Signed, ?Eula Listen, PA-C ?[facility] ?Pager: [phone] ?10/14/2021, 8:10 AM ? ?

## 2021-10-15 ENCOUNTER — Encounter: Payer: Self-pay | Admitting: Internal Medicine

## 2021-10-15 ENCOUNTER — Encounter: Payer: Self-pay | Admitting: Anesthesiology

## 2021-10-15 ENCOUNTER — Inpatient Hospital Stay (HOSPITAL_COMMUNITY): Payer: Medicare Other

## 2021-10-15 DIAGNOSIS — R9431 Abnormal electrocardiogram [ECG] [EKG]: Secondary | ICD-10-CM

## 2021-10-15 DIAGNOSIS — I472 Ventricular tachycardia, unspecified: Secondary | ICD-10-CM

## 2021-10-15 LAB — NM MYOCAR MULTI W/SPECT W/WALL MOTION / EF
LV dias vol: 57 mL (ref 46–106)
LV sys vol: 17 mL
Nuc Stress EF: 70 %
Peak HR: 89 {beats}/min
Percent HR: 55 %
Rest HR: 49 {beats}/min
Rest Nuclear Isotope Dose: 10.9 mCi
SDS: 0
SRS: 0
SSS: 0
ST Depression (mm): 0 mm
Stress Nuclear Isotope Dose: 33.3 mCi
TID: 1.36

## 2021-10-15 LAB — GLUCOSE, CAPILLARY
Glucose-Capillary: 89 mg/dL (ref 70–99)
Glucose-Capillary: 97 mg/dL (ref 70–99)

## 2021-10-15 MED ORDER — REGADENOSON 0.4 MG/5ML IV SOLN
0.4000 mg | Freq: Once | INTRAVENOUS | Status: AC
  Administered 2021-10-15: 0.4 mg via INTRAVENOUS

## 2021-10-15 MED ORDER — TECHNETIUM TC 99M TETROFOSMIN IV KIT
30.0000 | PACK | Freq: Once | INTRAVENOUS | Status: AC | PRN
  Administered 2021-10-15: 33.3 via INTRAVENOUS

## 2021-10-15 MED ORDER — TECHNETIUM TC 99M TETROFOSMIN IV KIT
10.0000 | PACK | Freq: Once | INTRAVENOUS | Status: AC | PRN
  Administered 2021-10-15: 10.93 via INTRAVENOUS

## 2021-10-15 NOTE — Progress Notes (Signed)
? ? ?Progress Note ? ?Patient Name: [name] ?Date of Encounter: 10/15/2021 ? ?Primary Cardiologist: New to [facility] - consult by Kirke Corin ? ?Subjective  ? ?No chest pain, dyspnea, palpitations, dizziness, presyncope, or syncope. Echo showed a preserved LVSF with an EF of 55-60%, normal wall motion, Gr1DD, normal RV systolic function and ventricular cavity size and no evidence of significant valvular abnormalities. She is for [facility] MPI today.  ? ?Inpatient Medications  ?  ?Scheduled Meds: ? clonazepam  0.25 mg Oral BID  ? enoxaparin (LOVENOX) injection  30 mg Subcutaneous Q24H  ? feeding supplement  1 Container Oral TID BM  ? haloperidol  5 mg Oral BID  ? haloperidol lactate  5 mg Intravenous Once  ? mouth rinse  15 mL Mouth Rinse q12n4p  ? midodrine  2.5 mg Oral TID WC  ? mirtazapine  7.5 mg Per Tube QHS  ? multivitamin with minerals  1 tablet Per Tube Daily  ? thiamine  100 mg Oral Daily  ? ?Continuous Infusions: ? sodium chloride    ? ?PRN Meds: ?acetaminophen **OR** acetaminophen, atropine, benztropine, menthol-cetylpyridinium, traZODone  ? ?Vital Signs  ?  ?Vitals:  ? 10/14/21 1949 10/14/21 2327 10/15/21 0342 10/15/21 0500  ?BP: (!) 89/55 98/84 104/62   ?Pulse: (!) 54 60 69   ?Resp: 17 16 16    ?Temp: (!) 97.4 ?F (36.3 ?C) 98 ?F (36.7 ?C) 97.9 ?F (36.6 ?C)   ?TempSrc: Oral Oral Oral   ?SpO2: 100% 96% 98%   ?Weight:    46.1 kg  ?Height:      ? ? ?Intake/Output Summary (Last 24 hours) at 10/15/2021 0849 ?Last data filed at 10/15/2021 0400 ?Gross per 24 hour  ?Intake 200 ml  ?Output 0 ml  ?Net 200 ml  ? ? ?Filed Weights  ? 10/13/21 1003 10/14/21 0414 10/15/21 0500  ?Weight: 44.1 kg 44.7 kg 46.1 kg  ? ? ?Telemetry  ?  ?Sinus bradycardia, 40s to 50s bpm - Personally Reviewed ? ?ECG  ?  ?No new tracings - Personally Reviewed ? ?Physical Exam  ? ?GEN: No acute distress.   ?Neck: No JVD. ?Cardiac: Bradycardic, no murmurs, rubs, or gallops.  ?Respiratory: Clear to auscultation bilaterally.  ?GI: Soft, nontender,  non-distended.   ?MS: No edema; No deformity. ?Neuro:  Alert; Nonfocal.  ?Psych: Flat affect. ? ?Labs  ?  ?Chemistry ?Recent Labs  ?Lab 10/09/21 ?0624 10/13/21 ?1345 10/14/21 ?0518  ?NA 136 138 137  ?K 4.2 3.8 4.4  ?CL 95* 99 100  ?CO2 33* 29 27  ?GLUCOSE 108* 93 92  ?BUN 22* 17 20  ?CREATININE 0.69 0.67 0.68  ?CALCIUM 9.3 8.9 8.9  ?GFRNONAA >60 >60 >60  ?ANIONGAP 8 10 10   ? ?  ? ?Hematology ?Recent Labs  ?Lab 10/09/21 ?0624 10/14/21 ?0518  ?WBC 7.0 4.9  ?RBC 4.13 3.82*  ?HGB 11.5* 10.7*  ?HCT 36.3 34.4*  ?MCV 87.9 90.1  ?MCH 27.8 28.0  ?MCHC 31.7 31.1  ?RDW 13.0 12.9  ?PLT 302 310  ? ? ? ?Cardiac EnzymesNo results for input(s): TROPONINI in the last 168 hours. No results for input(s): TROPIPOC in the last 168 hours.  ? ?BNPNo results for input(s): BNP, PROBNP in the last 168 hours.  ? ?DDimer No results for input(s): DDIMER in the last 168 hours.  ? ?Radiology  ?  ? ?Cardiac Studies  ? ?2D echo 03/27/2020: ?1. Left ventricular ejection fraction, by estimation, is 60 to 65%. The  ?left ventricle has normal function. The left  ventricle has no regional  ?wall motion abnormalities. Left ventricular diastolic parameters were  ?normal.  ? 2. Right ventricular systolic function is normal. The right ventricular  ?size is normal. There is normal pulmonary artery systolic pressure.  ? 3. Right atrial size was mildly dilated.  ? 4. The mitral valve is normal in structure. Trivial mitral valve  ?regurgitation. No evidence of mitral stenosis.  ? 5. The aortic valve is normal in structure. Aortic valve regurgitation is  ?not visualized. No aortic stenosis is present.  ? 6. The inferior vena cava is normal in size with greater than 50%  ?respiratory variability, suggesting right atrial pressure of 3 mmHg. ?__________ ? ?2D echo 10/14/2021: ?1. Left ventricular ejection fraction, by estimation, is 55 to 60%. The  ?left ventricle has normal function. The left ventricle has no regional  ?wall motion abnormalities. Left ventricular  diastolic parameters are  ?consistent with Grade I diastolic  ?dysfunction (impaired relaxation).  ? 2. Right ventricular systolic function is normal. The right ventricular  ?size is normal.  ? 3. The mitral valve is normal in structure. No evidence of mitral valve  ?regurgitation. No evidence of mitral stenosis.  ? 4. The aortic valve is normal in structure. Aortic valve regurgitation is  ?not visualized. Aortic valve sclerosis is present, with no evidence of  ?aortic valve stenosis.  ? 5. The inferior vena cava is normal in size with greater than 50%  ?respiratory variability, suggesting right atrial pressure of 3 mmHg.  ? ?Patient Profile  ?   ?61 y.o. female with history of paranoid schizophrenia and protein calorie malnutrition who was seen in consult on 3/27 for VT in the setting of ECT. ? ?Assessment & Plan  ?  ?1. Wide complex tachycardia: ?-No further episodes noted on tele ?-High sensitivity troponin negative x 2 ?-Echo with preserved LVSF and no WMA ?-NPO ?-She is for Lexiscan MPI today to evaluate for high risk ischemia, if this is unrevealing she may resume ECT treatments  ?-Unable to add beta blocker or amiodarone at this time given underlying sinus bradycardia ?-Possibly in the setting of catecholamine response with ECT ?-Magnesium and potassium at goal ?-TSH and free T4 normal ?   ? ?For questions or updates, please contact [facility] ?Please consult [email or web address] for contact info under Cardiology/STEMI. ?  ? ?Signed, ?Eula Listen, PA-C ?[facility] ?Pager: [phone] ?10/15/2021, 8:49 AM ? ?

## 2021-10-15 NOTE — Progress Notes (Signed)
Nutrition Follow-up ? ?DOCUMENTATION CODES:  ? ?Underweight, Severe malnutrition in context of chronic illness ? ?INTERVENTION:  ?- Encourage PO intake ?- Boost Breeze po TID, each supplement provides 250 kcal and 9 grams of protein ?- MVI with minerals daily ? ?NUTRITION DIAGNOSIS:  ? ?Severe Malnutrition related to chronic illness (paranoid schizophrenia) as evidenced by percent weight loss, moderate fat depletion, severe fat depletion, moderate muscle depletion, severe muscle depletion. ? ?Ongoing ? ?GOAL:  ? ?Patient will meet greater than or equal to 90% of their needs ? ?Goal met- meeting needs via meals and nutrition supplements ? ?MONITOR:  ? ?Diet advancement, Labs, Weight trends, TF tolerance, Skin, I & O's ? ?REASON FOR ASSESSMENT:  ? ?Malnutrition Screening Tool ?  ? ?ASSESSMENT:  ? ?[name] is a 61 y.o. female with medical history significant of paranoid schizophrenia presented to the emergency room on 09/12/21 brought by family for not 8 drinking and eating and becoming hypoglycemic.  Patient is not taking her meds and is having hallucinations and delusional thoughts. ? ?3/9- NG tube placed ?3/16-transferred from [facility] for ECT treatment ?3/23-NG tube removed ? ?Per Cardiology, pt had Lexiscan MPI this morning to evaluate for high risk ischemia given pt had ventricular tachycardia during ECT treatment on 3/28, if unrevealing, may resume ECT treatments. ? ?Pt laying in bed, provided limited information. She states that she has been eating well throughout admission and enjoys the [employer]. ? ?Meal completions:  ?90% x 1 recorded meal + 100% x 4 recorded meals ? ?Reviewed weights. Weights are stable. Current weight 46.1 kg.  ? ?Medications: mirtazapine,MVI, thiamine ? ?Labs: phos 4.9, mg 2.5 ? ?Diet Order:   ?Diet Order   ? ?       ?  Diet regular Room service appropriate? Yes; Fluid consistency: Thin  Diet effective now       ?  ? ?  ?  ? ?  ? ? ?EDUCATION NEEDS:  ? ?Education needs have been  addressed ? ?Skin:  Skin Assessment: Reviewed RN Assessment ? ?Last BM:  3/26 ? ?Height:  ? ?Ht Readings from Last 1 Encounters:  ?10/13/21 _0  (1.626 m)  ? ? ?Weight:  ? ?Wt Readings from Last 1 Encounters:  ?10/15/21 46.1 kg  ? ? ?Ideal Body Weight:  54.5 kg ? ?BMI:  Body mass index is 17.45 kg/m?. ? ?Estimated Nutritional Needs:  ? ?Kcal:  1750-1950 ? ?Protein:  90-105 grams ? ?Fluid:  > 1.7 L ? ?Clayborne Dana, RDN, LDN ?Clinical Nutrition ?

## 2021-10-15 NOTE — Progress Notes (Signed)
? ? ? ?Progress Note  ? ? ?[name]  [id number] DOB: Mar 28, 1961  DOA: 10/02/2021 ?PCP: [facility]  ? ? ? ? ?Brief Narrative:  ? ? ?Medical records reviewed and are as summarized below: ? ?[name] is a 61 y.o. female with history of paranoid schizophrenia, anemia was initially admitted to [facility] on 09/12/2021 the patient had a long hospitalization and the decision was made to transfer the patient on 10/02/2021 over at [facility] for ECT treatments for paranoid schizophrenia because she was not eating.  The patient had an NG tube placed.  The patient was treated for electrolyte abnormalities during the hospital course.  The patient had her first ECT treatment treatment on 10/06/2021.  She had an episode of ventricular tachycardia during the ECT.  Cardiology was consulted to assist with management. ? ? ? ? ? ? ?Assessment/Plan:  ? ?Principal Problem: ?  Schizophrenia, paranoid ([place]) ?Active Problems: ?  Sinus bradycardia ?  Hypokalemia, hypomagnesemia and hypophosphatemia ?  Protein-calorie malnutrition, severe ([place]) ?  Anemia of chronic disease ?  Hypotension ?  Ventricular tachycardia ? ? ?Nutrition Problem: Severe Malnutrition ?Etiology: chronic illness (paranoid schizophrenia) ? ?Signs/Symptoms: percent weight loss, moderate fat depletion, severe fat depletion, moderate muscle depletion, severe muscle depletion ? ? ?Body mass index is 17.45 kg/m?.  (Underweight) ? ? ? ? ? ?Paranoid schizophrenia: S/p ECT on 10/06/2021.  No further ECT per psychiatrist.  Continue psychotropics ? ?S/p ventricular tachycardia during ECT: 2D echo and nuclear stress test were unremarkable.  No further work-up from cardiology standpoint.  Not a candidate for beta-blockers because of bradycardia ? ?Sinus bradycardia: Asymptomatic.  Monitor heart rate on telemetry. ? ?Hypotension: Continue midodrine ? ?Hypokalemia, hypomagnesemia, hypophosphatemia: Improved ? ?Underweight/severe protein  calorie malnutrition, encourage adequate oral intake.  Continue nutritional supplements and follow-up with dietitian. ? ?Diet Order   ? ?       ?  Diet regular Room service appropriate? Yes; Fluid consistency: Thin  Diet effective now       ?  ? ?  ?  ? ?  ? ? ? ? ? ? ? ? ? ? ?Consultants: ?Psychiatrist ?Cardiologist ? ?Procedures: ?ECT on 10/06/2021 ? ? ? ?Medications:  ? ? clonazepam  0.25 mg Oral BID  ? enoxaparin (LOVENOX) injection  30 mg Subcutaneous Q24H  ? feeding supplement  1 Container Oral TID BM  ? haloperidol  5 mg Oral BID  ? haloperidol lactate  5 mg Intravenous Once  ? mouth rinse  15 mL Mouth Rinse q12n4p  ? midodrine  2.5 mg Oral TID WC  ? mirtazapine  7.5 mg Per Tube QHS  ? multivitamin with minerals  1 tablet Per Tube Daily  ? thiamine  100 mg Oral Daily  ? ?Continuous Infusions: ? sodium chloride    ? ? ? ?Anti-infectives (From admission, onward)  ? ? None  ? ?  ? ? ? ? ? ? ? ? ? ?Family Communication/Anticipated D/C date and plan/Code Status  ? ?DVT prophylaxis: enoxaparin (LOVENOX) injection 30 mg Start: 10/09/21 2200 ?SCDs Start: 10/02/21 2019 ? ? ?  Code Status: Full Code ? ?Family Communication: None ?Disposition Plan: Plan to discharge home when medically stable ? ? ?Status is: Inpatient ?Remains inpatient appropriate because: Paranoid schizophrenia s/p ECT requiring monitoring by psychiatrist ? ? ? ? ? ? ?Subjective:  ? ?Interval events noted.  She has no complaints.  No dizziness, chest pain or shortness of breath ? ?Objective:  ? ? ?  Vitals:  ? 10/15/21 0342 10/15/21 0500 10/15/21 1143 10/15/21 1623  ?BP: 104/62  (!) 91/56 (!) 86/52  ?Pulse: 69  (!) 52 (!) 55  ?Resp: 16  15 20   ?Temp: 97.9 ?F (36.6 ?C)  97.8 ?F (36.6 ?C) (!) 97.4 ?F (36.3 ?C)  ?TempSrc: Oral   Oral  ?SpO2: 98%  99% 100%  ?Weight:  46.1 kg    ?Height:      ? ?No data found. ? ? ?Intake/Output Summary (Last 24 hours) at 10/15/2021 1735 ?Last data filed at 10/15/2021 1300 ?Gross per 24 hour  ?Intake 440 ml  ?Output 0 ml  ?Net  440 ml  ? ?Filed Weights  ? 10/13/21 1003 10/14/21 0414 10/15/21 0500  ?Weight: 44.1 kg 44.7 kg 46.1 kg  ? ? ?Exam: ? ?GEN: NAD ?SKIN: No rash ?EYES: EOMI ?ENT: MMM ?CV: RRR ?PULM: CTA B ?ABD: soft, ND, NT, +BS ?CNS: AAO x 1 (person, non focal ?EXT: No edema or tenderness ?PSYCH: Poor insight and judgment ? ? ?  ? ? ?Data Reviewed:  ? ?I have personally reviewed following labs and imaging studies: ? ?Labs: ?Labs show the following:  ? ?Basic Metabolic Panel: ?Recent Labs  ?Lab 10/09/21 ?0624 10/13/21 ?1345 10/14/21 ?0518  ?NA 136 138 137  ?K 4.2 3.8 4.4  ?CL 95* 99 100  ?CO2 33* 29 27  ?GLUCOSE 108* 93 92  ?BUN 22* 17 20  ?CREATININE 0.69 0.67 0.68  ?CALCIUM 9.3 8.9 8.9  ?MG 2.5*  --  2.5*  ?PHOS 4.5  --  4.9*  ? ?GFR ?Estimated Creatinine Clearance: 54.4 mL/min (by C-G formula based on SCr of 0.68 mg/dL). ?Liver Function Tests: ?No results for input(s): AST, ALT, ALKPHOS, BILITOT, PROT, ALBUMIN in the last 168 hours. ?No results for input(s): LIPASE, AMYLASE in the last 168 hours. ?No results for input(s): AMMONIA in the last 168 hours. ?Coagulation profile ?No results for input(s): INR, PROTIME in the last 168 hours. ? ?CBC: ?Recent Labs  ?Lab 10/09/21 ?0624 10/14/21 ?0518  ?WBC 7.0 4.9  ?NEUTROABS 5.2  --   ?HGB 11.5* 10.7*  ?HCT 36.3 34.4*  ?MCV 87.9 90.1  ?PLT 302 310  ? ?Cardiac Enzymes: ?No results for input(s): CKTOTAL, CKMB, CKMBINDEX, TROPONINI in the last 168 hours. ?BNP (last 3 results) ?No results for input(s): PROBNP in the last 8760 hours. ?CBG: ?Recent Labs  ?Lab 10/11/21 ?0602 10/12/21 ?[id number] 10/14/21 ?[id number] 10/15/21 ?0443 10/15/21 ?[id number]  ?GLUCAP 103* 89 107* 97 89  ? ?D-Dimer: ?No results for input(s): DDIMER in the last 72 hours. ?Hgb A1c: ?No results for input(s): HGBA1C in the last 72 hours. ?Lipid Profile: ?No results for input(s): CHOL, HDL, LDLCALC, TRIG, CHOLHDL, LDLDIRECT in the last 72 hours. ?Thyroid function studies: ?No results for input(s): TSH, T4TOTAL, T3FREE, THYROIDAB in the last 72  hours. ? ?Invalid input(s): FREET3 ?Anemia work up: ?No results for input(s): VITAMINB12, FOLATE, FERRITIN, TIBC, IRON, RETICCTPCT in the last 72 hours. ?Sepsis Labs: ?Recent Labs  ?Lab 10/09/21 ?0624 10/14/21 ?0518  ?WBC 7.0 4.9  ? ? ?Microbiology ?No results found for this or any previous visit (from the past 240 hour(s)). ? ?Procedures and diagnostic studies: ? ?NM Myocar Multi W/Spect W/Wall Motion / EF ? ?Result Date: 10/15/2021 ?  The study is normal. The study is low risk.   No ST deviation was noted.   LV perfusion is normal. There is no evidence of ischemia. There is no evidence of infarction.   Left ventricular function is normal. End  diastolic cavity size is normal. End systolic cavity size is normal.  ? ?ECHOCARDIOGRAM COMPLETE ? ?Result Date: 10/14/2021 ?   ECHOCARDIOGRAM REPORT   Patient Name:   [name] Date of Exam: 10/14/2021 Medical Rec #:  [id number]   Height:       64.0 in Accession #:    [id number]  Weight:       98.5 lb Date of Birth:  [birth year]   BSA:          1.448 m? Patient Age:    46 years    BP:           91/53 mmHg Patient Gender: F           HR:           63 bpm. Exam Location:  [facility] Procedure: 2D Echo, Cardiac Doppler and Color Doppler Indications:     I47.2 Ventricular tachycardia  History:         Patient has prior history of Echocardiogram examinations. Risk                  Factors:Current Smoker.  Sonographer:     Rosalia Hammers Referring Phys:  [id number] Sharen Hones Diagnosing Phys: Kathlyn Sacramento MD  Sonographer Comments: Image acquisition challenging due to respiratory motion and Image acquisition challenging due to patient body habitus. IMPRESSIONS  1. Left ventricular ejection fraction, by estimation, is 55 to 60%. The left ventricle has normal function. The left ventricle has no regional wall motion abnormalities. Left ventricular diastolic parameters are consistent with Grade I diastolic dysfunction (impaired relaxation).  2. Right ventricular systolic function is normal. The  right ventricular size is normal.  3. The mitral valve is normal in structure. No evidence of mitral valve regurgitation. No evidence of mitral stenosis.  4. The aortic valve is normal in structure. Aortic

## 2021-10-15 NOTE — Progress Notes (Signed)
PT Cancellation Note ? ?Patient Details ?Name: [name] ?MRN: [id number] ?DOB: 18-Apr-1961 ? ? ?Cancelled Treatment:    Reason Eval/Treat Not Completed: Patient declined, no reason specified. Patient declines ambulation at this time, RN aware. Will re-attempt at later date.   ? ? ?Tisa Weisel ?10/15/2021, 2:15 PM ?

## 2021-10-16 DIAGNOSIS — R4182 Altered mental status, unspecified: Secondary | ICD-10-CM

## 2021-10-16 LAB — GLUCOSE, CAPILLARY: Glucose-Capillary: 86 mg/dL (ref 70–99)

## 2021-10-16 LAB — RESP PANEL BY RT-PCR (FLU A&B, COVID) ARPGX2
Influenza A by PCR: NEGATIVE
Influenza B by PCR: NEGATIVE
SARS Coronavirus 2 by RT PCR: NEGATIVE

## 2021-10-16 LAB — BASIC METABOLIC PANEL
Anion gap: 7 (ref 5–15)
BUN: 15 mg/dL (ref 6–20)
CO2: 29 mmol/L (ref 22–32)
Calcium: 8.9 mg/dL (ref 8.9–10.3)
Chloride: 102 mmol/L (ref 98–111)
Creatinine, Ser: 0.7 mg/dL (ref 0.44–1.00)
GFR, Estimated: >60 mL/min (ref >60)
Glucose, Bld: 88 mg/dL (ref 70–99)
Potassium: 3.9 mmol/L (ref 3.5–5.1)
Sodium: 138 mmol/L (ref 135–145)

## 2021-10-16 LAB — MAGNESIUM: Magnesium: 2.5 mg/dL — ABNORMAL HIGH (ref 1.7–2.4)

## 2021-10-16 LAB — PHOSPHORUS: Phosphorus: 4.6 mg/dL (ref 2.5–4.6)

## 2021-10-16 MED ORDER — ADULT MULTIVITAMIN W/MINERALS CH
1.0000 | ORAL_TABLET | Freq: Every day | ORAL | Status: DC
  Administered 2021-10-17: 1 via ORAL
  Filled 2021-10-16: qty 1

## 2021-10-16 MED ORDER — MIRTAZAPINE 15 MG PO TBDP
7.5000 mg | ORAL_TABLET | Freq: Every day | ORAL | Status: DC
  Administered 2021-10-16: 7.5 mg via ORAL
  Filled 2021-10-16: qty 0.5

## 2021-10-16 NOTE — Consult Note (Signed)
[facility] Face-to-Face Psychiatry Consult  ? ?Reason for Consult: Consult to follow-up on this 61 year old woman with schizophrenia recently treated with ECT ?Referring Physician:  Myriam Forehand ?Patient Identification: [name] ?MRN:  [id number] ?Principal Diagnosis: Schizophrenia, paranoid (HCC) ?Diagnosis:  Principal Problem: ?  Schizophrenia, paranoid (HCC) ?Active Problems: ?  Hypotension ?  Hypokalemia, hypomagnesemia and hypophosphatemia ?  Protein-calorie malnutrition, severe (HCC) ?  Anemia of chronic disease ?  Sinus bradycardia ?  Ventricular tachycardia ? ? ?Total Time spent with patient: 30 minutes ? ?Subjective:   ?Carie Kapuscinski is a 61 y.o. female patient admitted with "I am doing alright". ? ?HPI: Patient seen and chart reviewed.  Spoke with patient's sister and guardian.  Patient says she thinks she is doing okay.  She is eating and has been able to get up and move around the room.  Patient denies having any hallucinations.  She denies any psychiatric symptoms.  She does seem somewhat blunted in her affect and when I talked with her about her home life she showed some signs of possible paranoia.  Seemed overly concerned about how people might take her belongings away from her at her apartment.  I spoke with her sister who admits that she herself has not spoken with the patient.  She expresses concern however that the patient be able to stay on her medicine and be functioning as well as possible when she goes home. ? ?Past Psychiatric History: Past history of longstanding chronic schizophrenia recently recovered from a catatonic like spell ? ?Risk to Self:   ?Risk to Others:   ?Prior Inpatient Therapy:   ?Prior Outpatient Therapy:   ? ?Past Medical History:  ?Past Medical History:  ?Diagnosis Date  ? Anemia 10/02/2021  ? Non compliance w medication regimen   ? Schizophrenia (HCC)   ?  ?Past Surgical History:  ?Procedure Laterality Date  ? BIOPSY  09/25/2021  ? Procedure: BIOPSY;  Surgeon: Rachael Fee, MD;   Location: [facility];  Service: Gastroenterology;;  ? ESOPHAGOGASTRODUODENOSCOPY (EGD) WITH PROPOFOL [name] 09/25/2021  ? Procedure: ESOPHAGOGASTRODUODENOSCOPY (EGD) WITH PROPOFOL;  Surgeon: Rachael Fee, MD;  Location: [facility];  Service: Gastroenterology;  Laterality: [name];  With NGT placement  ? ?Family History: History reviewed. No pertinent family history. ?Family Psychiatric  History: See previous ?Social History:  ?Social History  ? ?Substance and Sexual Activity  ?Alcohol Use Not Currently  ? Comment: Refuses to disclose how much  ?   ?Social History  ? ?Substance and Sexual Activity  ?Drug Use No  ? Comment: Refuses to answer  ?  ?Social History  ? ?Socioeconomic History  ? Marital status: Single  ?  Spouse name: Not on file  ? Number of children: Not on file  ? Years of education: Not on file  ? Highest education level: Not on file  ?Occupational History  ? Not on file  ?Tobacco Use  ? Smoking status: Some Days  ?  Packs/day: 0.50  ?  Types: Cigarettes  ? Smokeless tobacco: Never  ?Vaping Use  ? Vaping Use: Every day  ?Substance and Sexual Activity  ? Alcohol use: Not Currently  ?  Comment: Refuses to disclose how much  ? Drug use: No  ?  Comment: Refuses to answer  ? Sexual activity: Never  ?  Comment: refused to answer  ?Other Topics Concern  ? Not on file  ?Social History Narrative  ? Not on file  ? ?Social Determinants of Health  ? ?Financial Resource Strain: Not on  file  ?Food Insecurity: Not on file  ?Transportation Needs: Not on file  ?Physical Activity: Not on file  ?Stress: Not on file  ?Social Connections: Not on file  ? ?Additional Social History: ?  ? ?Allergies:   ?Allergies  ?Allergen Reactions  ? Aspirin Hives and Other (See Comments)  ?  Possibly caused hives, per sister  ? Ziprasidone Hcl Rash and Other (See Comments)  ?  Caused twitching  ? ? ?Labs:  ?Results for orders placed or performed during the hospital encounter of 10/02/21 (from the past 48 hour(s))  ?Glucose, capillary      Status: None  ? Collection Time: 10/15/21  4:43 AM  ?Result Value Ref Range  ? Glucose-Capillary 97 70 - 99 mg/dL  ?  Comment: Glucose reference range applies only to samples taken after fasting for at least 8 hours.  ?Glucose, capillary     Status: None  ? Collection Time: 10/15/21  5:25 AM  ?Result Value Ref Range  ? Glucose-Capillary 89 70 - 99 mg/dL  ?  Comment: Glucose reference range applies only to samples taken after fasting for at least 8 hours.  ?Glucose, capillary     Status: None  ? Collection Time: 10/16/21  5:11 AM  ?Result Value Ref Range  ? Glucose-Capillary 86 70 - 99 mg/dL  ?  Comment: Glucose reference range applies only to samples taken after fasting for at least 8 hours.  ?Basic metabolic panel     Status: None  ? Collection Time: 10/16/21  6:06 AM  ?Result Value Ref Range  ? Sodium 138 135 - 145 mmol/L  ? Potassium 3.9 3.5 - 5.1 mmol/L  ? Chloride 102 98 - 111 mmol/L  ? CO2 29 22 - 32 mmol/L  ? Glucose, Bld 88 70 - 99 mg/dL  ?  Comment: Glucose reference range applies only to samples taken after fasting for at least 8 hours.  ? BUN 15 6 - 20 mg/dL  ? Creatinine, Ser 0.70 0.44 - 1.00 mg/dL  ? Calcium 8.9 8.9 - 10.3 mg/dL  ? GFR, Estimated >60 >60 mL/min  ?  Comment: (NOTE) ?Calculated using the CKD-EPI Creatinine Equation (2021) ?  ? Anion gap 7 5 - 15  ?  Comment: Performed at [facility], [address] [place], [place] [address]  ?Magnesium     Status: Abnormal  ? Collection Time: 10/16/21  6:06 AM  ?Result Value Ref Range  ? Magnesium 2.5 (H) 1.7 - 2.4 mg/dL  ?  Comment: Performed at [facility], [address] [place], [place] [address]  ?Phosphorus     Status: None  ? Collection Time: 10/16/21  6:06 AM  ?Result Value Ref Range  ? Phosphorus 4.6 2.5 - 4.6 mg/dL  ?  Comment: Performed at [facility], [address] [place], [place] [address]  ? ? ?Current Facility-Administered Medications  ?Medication Dose Route Frequency Provider Last Rate Last Admin  ?  0.9 %  sodium chloride infusion  500 mL Intravenous Once Raegan Sipp, [name], MD      ? acetaminophen (TYLENOL) tablet 650 mg  650 mg Oral Q6H PRN Gertha Calkin, MD      ? Or  ? acetaminophen (TYLENOL) suppository 650 mg  650 mg Rectal Q6H PRN Gertha Calkin, MD      ? atropine 1 MG/10ML injection 1 mg  1 mg Intravenous Q5 Min x 3 PRN Manuela Schwartz, NP      ? benztropine (COGENTIN) tablet  0.5 mg  0.5 mg Oral BID PRN Alford HighlandWieting, Richard, MD      ? clonazePAM Scarlette Calico(KLONOPIN) disintegrating tablet 0.25 mg  0.25 mg Oral BID Ronnald Rampatel, Kishan S, RPH   0.25 mg at 10/16/21 [id number]  ? enoxaparin (LOVENOX) injection 30 mg  30 mg Subcutaneous Q24H Ronnald Rampatel, Kishan S, RPH   30 mg at 10/15/21 2216  ? feeding supplement (BOOST / RESOURCE BREEZE) liquid 1 Container  1 Container Oral TID BM Marrion CoyZhang, Dekui, MD   1 Container at 10/16/21 0940  ? haloperidol (HALDOL) tablet 5 mg  5 mg Oral BID Ronnald Rampatel, Kishan S, RPH   5 mg at 10/16/21 0940  ? haloperidol lactate (HALDOL) injection 5 mg  5 mg Intravenous Once Crandall Harvel, Jackquline DenmarkJohn T, MD      ? MEDLINE mouth rinse  15 mL Mouth Rinse q12n4p Alford HighlandWieting, Richard, MD   15 mL at 10/16/21 1638  ? menthol-cetylpyridinium (CEPACOL) lozenge 3 mg  1 lozenge Oral PRN Alford HighlandWieting, Richard, MD      ? midodrine (PROAMATINE) tablet 2.5 mg  2.5 mg Oral TID WC Marrion CoyZhang, Dekui, MD   2.5 mg at 10/16/21 1638  ? mirtazapine (REMERON SOL-TAB) disintegrating tablet 7.5 mg  7.5 mg Oral QHS Sharen HonesDolan, Carissa E, RPH      ? [START ON 10/17/2021] multivitamin with minerals tablet 1 tablet  1 tablet Oral Daily Sharen HonesDolan, Carissa E, RPH      ? thiamine tablet 100 mg  100 mg Oral Daily Ronnald Rampatel, Kishan S, RPH   100 mg at 10/16/21 [id number]  ? traZODone (DESYREL) tablet 50 mg  50 mg Oral QHS PRN Alford HighlandWieting, Richard, MD      ? ? ?Musculoskeletal: ?Strength & Muscle Tone: within normal limits ?Gait & Station: normal ?Patient leans: [name] ? ? ? ? ? ? ? ? ? ? ? ?Psychiatric Specialty Exam: ? ?Presentation  ?General Appearance: Disheveled ? ?Eye Contact:Minimal ? ?Speech:Clear and  Coherent; Normal Rate ? ?Speech Volume:Normal ? ?Handedness:Right ? ? ?Mood and Affect  ?Mood:Dysphoric; Irritable ? ?Affect:Flat; Blunt ? ? ?Thought Process  ?Thought Processes:Coherent; Linear ? ?Descriptions

## 2021-10-16 NOTE — Progress Notes (Signed)
Physical Therapy Treatment ?Patient Details ?Name: [name] ?MRN: [id number] ?DOB: 12/19/1960 ?Today's Date: 10/16/2021 ? ? ?History of Present Illness [name] is a 60yoF who [facility] transfer from [facility] for ECT therapy. At home pt was experiencing hallucinations and delusional thoughts, known history of paranoid schizophrenia and recent medication noncompliance. Pt was not eating or drinking at home. ? ?  ?PT Comments  ? ? Pt resting in bed upon PT arrival; pt agreeable to PT session.  During session pt modified independent with bed mobility; independent with transfers; CGA to SBA with ambulation 280 feet (no AD use); and CGA navigating 12 steps with railing.   Very mild drifting at times or occasional altered stepping pattern noted during ambulation (close SBA to CGA for safety) but pt able to self correct.  HR in 70's to 80's bpm during sessions activities.  Will continue to focus on progressive functional mobility during hospitalization. ?  ?Recommendations for follow up therapy are one component of a multi-disciplinary discharge planning process, led by the attending physician.  Recommendations may be updated based on patient status, additional functional criteria and insurance authorization. ? ?Follow Up Recommendations ? No PT follow up ?  ?  ?Assistance Recommended at Discharge Intermittent Supervision/Assistance  ?Patient can return home with the following A little help with walking and/or transfers;Assistance with cooking/housework;Help with stairs or ramp for entrance ?  ?Equipment Recommendations ? Rolling walker (2 wheels)  ?  ?Recommendations for Other Services   ? ? ?  ?Precautions / Restrictions Precautions ?Precautions: Fall ?Restrictions ?Weight Bearing Restrictions: No  ?  ? ?Mobility ? Bed Mobility ?Overal bed mobility: Modified Independent ?  ?  ?  ?  ?  ?  ?General bed mobility comments: Semi-supine to/from sitting without any noted difficulty ?  ? ?Transfers ?Overall transfer  level: Independent ?Equipment used: None ?Transfers: Sit to/from Stand ?Sit to Stand: Independent ?  ?  ?  ?  ?  ?General transfer comment: steady safe transfers noted ?  ? ?Ambulation/Gait ?Ambulation/Gait assistance: Supervision, Min guard ?Gait Distance (Feet): 280 Feet ?Assistive device: None ?  ?Gait velocity: decreased ?  ?  ?General Gait Details: very mild drifting at times or occasional altered stepping pattern (close SBA to CGA for safety) but pt able to self correct ? ? ?Stairs ?Stairs: Yes ?Stairs assistance: Min guard, Supervision ?Stair Management: One rail Right ?Number of Stairs: 12 ?General stair comments: alternating pattern ascending stairs; step to pattern descending stairs; overall steady ? ? ?Wheelchair Mobility ?  ? ?Modified Rankin (Stroke Patients Only) ?  ? ? ?  ?Balance Overall balance assessment: Needs assistance ?Sitting-balance support: No upper extremity supported, Feet supported ?Sitting balance-Leahy Scale: Normal ?Sitting balance - Comments: steady sitting reaching outside BOS ?  ?Standing balance support: No upper extremity supported ?Standing balance-Leahy Scale: Good ?Standing balance comment: steady standing reaching within BOS ?  ?  ?  ?  ?  ?  ?  ?  ?  ?  ?  ?  ? ?  ?Cognition Arousal/Alertness: Awake/alert ?Behavior During Therapy: [facility] for tasks assessed/performed ?Overall Cognitive Status: Within Functional Limits for tasks assessed ?  ?  ?  ?  ?  ?  ?  ?  ?  ?  ?  ?  ?  ?  ?  ?  ?General Comments: Pleasant ?  ?  ? ?  ?Exercises   ? ?  ?General Comments  Nursing cleared pt for participation in physical therapy.  Pt agreeable to PT session. ?  ?  ? ?Pertinent Vitals/Pain Pain Assessment ?Pain Assessment: Faces ?Faces Pain Scale: No hurt ?Pain Intervention(s): Limited activity within patient's tolerance, Monitored during session, Repositioned ?Vitals (HR and O2 on room air) stable and WFL throughout treatment session.  ? ? ?Home Living   ?  ?  ?  ?  ?  ?  ?  ?  ?  ?   ?  ?Prior  Function    ?  ?  ?   ? ?PT Goals (current goals can now be found in the care plan section) Acute Rehab PT Goals ?Patient Stated Goal: go home ?PT Goal Formulation: With patient ?Progress towards PT goals: Progressing toward goals ? ?  ?Frequency ? ? ? Min 2X/week ? ? ? ?  ?PT Plan Current plan remains appropriate  ? ? ?Co-evaluation   ?  ?  ?  ?  ? ?  ?AM-PAC PT "6 Clicks" Mobility   ?Outcome Measure ? Help needed turning from your back to your side while in a flat bed without using bedrails?: None ?Help needed moving from lying on your back to sitting on the side of a flat bed without using bedrails?: None ?Help needed moving to and from a bed to a chair (including a wheelchair)?: None ?Help needed standing up from a chair using your arms (e.g., wheelchair or bedside chair)?: None ?Help needed to walk in hospital room?: A Little ?Help needed climbing 3-5 steps with a railing? : A Little ?6 Click Score: 22 ? ?  ?End of Session Equipment Utilized During Treatment: Gait belt ?Activity Tolerance: Patient tolerated treatment well ?Patient left: in bed;with call bell/phone within reach;with bed alarm set ?Nurse Communication: Mobility status;Precautions ?PT Visit Diagnosis: Difficulty in walking, not elsewhere classified (R26.2);Unsteadiness on feet (R26.81);Muscle weakness (generalized) (M62.81) ?  ? ? ?Time: 1157-2620 ?PT Time Calculation (min) (ACUTE ONLY): 11 min ? ?Charges:  $Gait Training: 8-22 mins          ?          ? ?Hendricks Limes, PT ?10/16/21, 12:00 PM ? ? ?

## 2021-10-16 NOTE — Progress Notes (Signed)
? ? ? ?Progress Note  ? ? ?[name]  UVO:[id number] DOB: 09-22-1960  DOA: 10/02/2021 ?PCP: [facility]  ? ? ? ? ?Brief Narrative:  ? ? ?Medical records reviewed and are as summarized below: ? ?[name] is a 61 y.o. female with history of paranoid schizophrenia, anemia was initially admitted to [facility] long hospital on 09/12/2021 the patient had a long hospitalization and the decision was made to transfer the patient on 10/02/2021 over at [facility] for ECT treatments for paranoid schizophrenia because she was not eating.  The patient had an NG tube placed.  The patient was treated for electrolyte abnormalities during the hospital course.  The patient had her first ECT treatment treatment on 10/06/2021.  She had an episode of ventricular tachycardia during the ECT.  Cardiology was consulted to assist with management. ? ? ? ? ? ? ?Assessment/Plan:  ? ?Principal Problem: ?  Schizophrenia, paranoid (HCC) ?Active Problems: ?  Sinus bradycardia ?  Hypokalemia, hypomagnesemia and hypophosphatemia ?  Protein-calorie malnutrition, severe (HCC) ?  Anemia of chronic disease ?  Hypotension ?  Ventricular tachycardia ? ? ?Nutrition Problem: Severe Malnutrition ?Etiology: chronic illness (paranoid schizophrenia) ? ?Signs/Symptoms: percent weight loss, moderate fat depletion, severe fat depletion, moderate muscle depletion, severe muscle depletion ? ? ?Body mass index is 18.01 kg/m?.  (Underweight) ? ? ? ? ? ?Paranoid schizophrenia: S/p ECT on 3/20, 3/22, 3/24 and 3/27.  No further ECT per psychiatrist.  Continue psychotropics ? ?S/p ventricular tachycardia during ECT on 3/27: 2D echo and nuclear stress test were unremarkable.  No further work-up from cardiology standpoint.  Not a candidate for beta-blockers because of bradycardia ? ?Sinus bradycardia: Asymptomatic.  Monitor heart rate on telemetry. ? ?Hypotension: She is asymptomatic.  Continue midodrine ? ?Hypokalemia, hypomagnesemia,  hypophosphatemia: Improved ? ?Underweight/severe protein calorie malnutrition, encourage adequate oral intake.  Continue nutritional supplements and follow-up with dietitian. ? ?Plan of care was discussed with [name], sister and legal guardian.  She requested an update from the psychiatrist, Dr. Toni Amend. ? ? ?Diet Order   ? ?       ?  Diet regular Room service appropriate? Yes; Fluid consistency: Thin  Diet effective now       ?  ? ?  ?  ? ?  ? ? ? ? ? ? ? ? ? ? ?Consultants: ?Psychiatrist ?Cardiologist ? ?Procedures: ?ECT on 10/06/2021 ? ? ? ?Medications:  ? ? clonazepam  0.25 mg Oral BID  ? enoxaparin (LOVENOX) injection  30 mg Subcutaneous Q24H  ? feeding supplement  1 Container Oral TID BM  ? haloperidol  5 mg Oral BID  ? haloperidol lactate  5 mg Intravenous Once  ? mouth rinse  15 mL Mouth Rinse q12n4p  ? midodrine  2.5 mg Oral TID WC  ? mirtazapine  7.5 mg Oral QHS  ? [START ON 10/17/2021] multivitamin with minerals  1 tablet Oral Daily  ? thiamine  100 mg Oral Daily  ? ?Continuous Infusions: ? sodium chloride    ? ? ? ?Anti-infectives (From admission, onward)  ? ? None  ? ?  ? ? ? ? ? ? ? ? ? ?Family Communication/Anticipated D/C date and plan/Code Status  ? ?DVT prophylaxis: enoxaparin (LOVENOX) injection 30 mg Start: 10/09/21 2200 ?SCDs Start: 10/02/21 2019 ? ? ?  Code Status: Full Code ? ?Family Communication: [name], sister and legal guardian ?Disposition Plan: Plan to discharge home when medically stable ? ? ?Status is: Inpatient ?Remains inpatient  appropriate because: Paranoid schizophrenia s/p ECT requiring monitoring by psychiatrist ? ? ? ? ? ? ?Subjective:  ? ?Interval events noted.  She has no complaints. ? ?Objective:  ? ? ?Vitals:  ? 10/16/21 0458 10/16/21 0500 10/16/21 0841 10/16/21 1302  ?BP: (!) 89/51  97/61 (!) 85/51  ?Pulse: (!) 57  (!) 52 (!) 59  ?Resp: 14  17 19   ?Temp: 97.9 ?F (36.6 ?C)  98.2 ?F (36.8 ?C) 97.8 ?F (36.6 ?C)  ?TempSrc: Axillary     ?SpO2: 95%  100% 99%  ?Weight:  47.6 kg     ?Height:      ? ?No data found. ? ? ?Intake/Output Summary (Last 24 hours) at 10/16/2021 1428 ?Last data filed at 10/16/2021 0000 ?Gross per 24 hour  ?Intake 100 ml  ?Output --  ?Net 100 ml  ? ?Filed Weights  ? 10/14/21 0414 10/15/21 0500 10/16/21 0500  ?Weight: 44.7 kg 46.1 kg 47.6 kg  ? ? ?Exam: ? ?GEN: NAD ?SKIN: No rash ?EYES: EOMI ?ENT: MMM ?CV: RRR ?PULM: CTA B ?ABD: soft, ND, NT, +BS ?CNS: AAO x 1 (person and place), non focal ?EXT: No edema or tenderness ?PSYCH: Poor insight and judgment ? ? ? ? ?  ? ? ?Data Reviewed:  ? ?I have personally reviewed following labs and imaging studies: ? ?Labs: ?Labs show the following:  ? ?Basic Metabolic Panel: ?Recent Labs  ?Lab 10/13/21 ?1345 10/14/21 ?4098 10/16/21 ?0606  ?NA 138 137 138  ?K 3.8 4.4 3.9  ?CL 99 100 102  ?CO2 29 27 29   ?GLUCOSE 93 92 88  ?BUN 17 20 15   ?CREATININE 0.67 0.68 0.70  ?CALCIUM 8.9 8.9 8.9  ?MG  --  2.5* 2.5*  ?PHOS  --  4.9* 4.6  ? ?GFR ?Estimated Creatinine Clearance: 56.2 mL/min (by C-G formula based on SCr of 0.7 mg/dL). ?Liver Function Tests: ?No results for input(s): AST, ALT, ALKPHOS, BILITOT, PROT, ALBUMIN in the last 168 hours. ?No results for input(s): LIPASE, AMYLASE in the last 168 hours. ?No results for input(s): AMMONIA in the last 168 hours. ?Coagulation profile ?No results for input(s): INR, PROTIME in the last 168 hours. ? ?CBC: ?Recent Labs  ?Lab 10/14/21 ?0518  ?WBC 4.9  ?HGB 10.7*  ?HCT 34.4*  ?MCV 90.1  ?PLT 310  ? ?Cardiac Enzymes: ?No results for input(s): CKTOTAL, CKMB, CKMBINDEX, TROPONINI in the last 168 hours. ?BNP (last 3 results) ?No results for input(s): PROBNP in the last 8760 hours. ?CBG: ?Recent Labs  ?Lab 10/12/21 ?1191 10/14/21 ?4782 10/15/21 ?0443 10/15/21 ?9562 10/16/21 ?1308  ?GLUCAP 89 107* 97 89 86  ? ?D-Dimer: ?No results for input(s): DDIMER in the last 72 hours. ?Hgb A1c: ?No results for input(s): HGBA1C in the last 72 hours. ?Lipid Profile: ?No results for input(s): CHOL, HDL, LDLCALC, TRIG, CHOLHDL,  LDLDIRECT in the last 72 hours. ?Thyroid function studies: ?No results for input(s): TSH, T4TOTAL, T3FREE, THYROIDAB in the last 72 hours. ? ?Invalid input(s): FREET3 ?Anemia work up: ?No results for input(s): VITAMINB12, FOLATE, FERRITIN, TIBC, IRON, RETICCTPCT in the last 72 hours. ?Sepsis Labs: ?Recent Labs  ?Lab 10/14/21 ?0518  ?WBC 4.9  ? ? ?Microbiology ?No results found for this or any previous visit (from the past 240 hour(s)). ? ?Procedures and diagnostic studies: ? ?NM Myocar Multi W/Spect W/Wall Motion / EF ? ?Result Date: 10/15/2021 ?  The study is normal. The study is low risk.   No ST deviation was noted.   LV perfusion is normal. There  is no evidence of ischemia. There is no evidence of infarction.   Left ventricular function is normal. End diastolic cavity size is normal. End systolic cavity size is normal.   ? ? ? ? ? ? ? ? ? ? ? ? LOS: 14 days  ? ?Topher Buenaventura  ?[facility]  ? ?Pager on [email or web address]. If 7PM-7AM, please contact night-coverage at [email or web address] ? ? ? ? ?10/16/2021, 2:28 PM  ? ? ? ? ? ? ? ? ? ?

## 2021-10-16 NOTE — Progress Notes (Signed)
? ?Progress Note ? ?Patient Name: [name] ?Date of Encounter: 10/16/2021 ? ?[facility] Cardiologist: [facility] ? ?Subjective  ? ?Reports no complaints today, ?Physical therapy notes reviewed, reports she was not eating or drinking at home ?Recent medication noncompliance ?Was experience hallucinations, delusional thoughts at home, feels better now ?Blood pressure low but reports she is asymptomatic ? ?Inpatient Medications  ?  ?Scheduled Meds: ? clonazepam  0.25 mg Oral BID  ? enoxaparin (LOVENOX) injection  30 mg Subcutaneous Q24H  ? feeding supplement  1 Container Oral TID BM  ? haloperidol  5 mg Oral BID  ? haloperidol lactate  5 mg Intravenous Once  ? mouth rinse  15 mL Mouth Rinse q12n4p  ? midodrine  2.5 mg Oral TID WC  ? mirtazapine  7.5 mg Per Tube QHS  ? multivitamin with minerals  1 tablet Per Tube Daily  ? thiamine  100 mg Oral Daily  ? ?Continuous Infusions: ? sodium chloride    ? ?PRN Meds: ?acetaminophen **OR** acetaminophen, atropine, benztropine, menthol-cetylpyridinium, traZODone  ? ?Vital Signs  ?  ?Vitals:  ? 10/16/21 0010 10/16/21 0458 10/16/21 0500 10/16/21 0841  ?BP: (!) 88/45 (!) 89/51  97/61  ?Pulse: 62 (!) 57  (!) 52  ?Resp: 17 14  17   ?Temp: 98.1 ?F (36.7 ?C) 97.9 ?F (36.6 ?C)  98.2 ?F (36.8 ?C)  ?TempSrc: Axillary Axillary    ?SpO2: 96% 95%  100%  ?Weight:   47.6 kg   ?Height:      ? ? ?Intake/Output Summary (Last 24 hours) at 10/16/2021 1244 ?Last data filed at 10/16/2021 0000 ?Gross per 24 hour  ?Intake 340 ml  ?Output --  ?Net 340 ml  ? ? ?  10/16/2021  ?  5:00 AM 10/15/2021  ?  5:00 AM 10/14/2021  ?  4:14 AM  ?Last 3 Weights  ?Weight (lbs) 104 lb 15 oz 101 lb 10.1 oz 98 lb 8.7 oz  ?Weight (kg) 47.6 kg 46.1 kg 44.7 kg  ?   ? ?Telemetry  ?  ?Normal sinus rhythm, no arrhythmia- Personally Reviewed ? ?ECG  ?  ? - Personally Reviewed ? ?Physical Exam  ? ?GEN: No acute distress.  Very thin ?Neck: No JVD ?Cardiac: RRR, no murmurs, rubs, or gallops.  ?Respiratory: Clear to auscultation  bilaterally. ?GI: Soft, nontender, non-distended  ?MS: No edema; No deformity. ?Neuro:  Nonfocal  ?Psych: Normal affect  ? ?Labs  ?  ?High Sensitivity Troponin:   ?Recent Labs  ?Lab 10/13/21 ?1759 10/13/21 ?2012  ?TROPONINIHS 5 4  ?   ?Chemistry ?Recent Labs  ?Lab 10/13/21 ?1345 10/14/21 ?10/16/21 10/16/21 ?0606  ?NA 138 137 138  ?K 3.8 4.4 3.9  ?CL 99 100 102  ?CO2 29 27 29   ?GLUCOSE 93 92 88  ?BUN 17 20 15   ?CREATININE 0.67 0.68 0.70  ?CALCIUM 8.9 8.9 8.9  ?MG  --  2.5* 2.5*  ?GFRNONAA >60 >60 >60  ?ANIONGAP 10 10 7   ?  ?Lipids No results for input(s): CHOL, TRIG, HDL, LABVLDL, LDLCALC, CHOLHDL in the last 168 hours.  ?Hematology ?Recent Labs  ?Lab 10/14/21 ?0518  ?WBC 4.9  ?RBC 3.82*  ?HGB 10.7*  ?HCT 34.4*  ?MCV 90.1  ?MCH 28.0  ?MCHC 31.1  ?RDW 12.9  ?PLT 310  ? ?Thyroid No results for input(s): TSH, FREET4 in the last 168 hours.  ?BNPNo results for input(s): BNP, PROBNP in the last 168 hours.  ?DDimer No results for input(s): DDIMER in the last 168 hours.  ? ?  Radiology  ?  ?NM Myocar Multi W/Spect W/Wall Motion / EF ? ?Result Date: 10/15/2021 ?  The study is normal. The study is low risk.   No ST deviation was noted.   LV perfusion is normal. There is no evidence of ischemia. There is no evidence of infarction.   Left ventricular function is normal. End diastolic cavity size is normal. End systolic cavity size is normal.   ? ?Cardiac Studies  ?Stress test ?  The study is normal. The study is low risk. ?  No ST deviation was noted. ?  LV perfusion is normal. There is no evidence of ischemia. There is no evidence of infarction. ?  Left ventricular function is normal. End diastolic cavity size is normal. End systolic cavity size is normal. ? ?Echocardiogram ? 1. Left ventricular ejection fraction, by estimation, is 55 to 60%. The  ?left ventricle has normal function. The left ventricle has no regional  ?wall motion abnormalities. Left ventricular diastolic parameters are  ?consistent with Grade I diastolic   ?dysfunction (impaired relaxation).  ? 2. Right ventricular systolic function is normal. The right ventricular  ?size is normal.  ? 3. The mitral valve is normal in structure. No evidence of mitral valve  ?regurgitation. No evidence of mitral stenosis.  ? 4. The aortic valve is normal in structure. Aortic valve regurgitation is  ?not visualized. Aortic valve sclerosis is present, with no evidence of  ?aortic valve stenosis.  ? 5. The inferior vena cava is normal in size with greater than 50%  ?respiratory variability, suggesting right atrial pressure of 3 mmHg.  ? ? ?Patient Profile  ?   ?61 y.o. female with history of paranoid schizophrenia and protein calorie malnutrition who was seen in consult on 3/27 for VT in the setting of ECT. ? ?Assessment & Plan  ?  ? ?Wide-complex tachycardia ?Has not had further episodes ?Echo, stress test with no acute findings ?Not on beta-blocker secondary to hypotension, bradycardia ?Felt possibly secondary to catecholamine response with ECT ? ?Hypotension ?In the setting of weight loss, protein calorie malnutrition ?Concerning in the setting of bradycardia ?Reports she is asymptomatic ?Consider checking orthostatics, ?Currently on midodrine 2.5 mg 3 times daily ?Could consider increasing up to 5 mg 3 times daily.  Unclear if she will be medication compliant ?Other option would be Florinef 0.1 mg daily ?Increased carbohydrate intake recommended ? ? ?Discussed with hospitalist service ?They are determining home arrangements ? Total encounter time more than 40 minutes ? Greater than 50% was spent in counseling and coordination of care with the patient ? ? ?For questions or updates, please contact [facility] ?Please consult [email or web address] for contact info under  ? ?  ?   ?Signed, ?Julien Nordmann, MD  ?10/16/2021, 12:44 PM   ? ?

## 2021-10-16 NOTE — Progress Notes (Signed)
?   10/16/21 1604  ?Assess: MEWS Score  ?Temp 98 ?F (36.7 ?C)  ?BP (!) 94/59  ?Pulse Rate (!) 47  ?Resp 18  ?SpO2 99 %  ?O2 Device Room Air  ?Assess: MEWS Score  ?MEWS Temp 0  ?MEWS Systolic 1  ?MEWS Pulse 1  ?MEWS RR 0  ?MEWS LOC 0  ?MEWS Score 2  ?MEWS Score Color Yellow  ?Assess: if the MEWS score is Yellow or Red  ?Were vital signs taken at a resting state? Yes  ?Focused Assessment No change from prior assessment  ?Does the patient meet 2 or more of the SIRS criteria? No  ?MEWS guidelines implemented *See Row Information* Yes  ?Treat  ?Pain Scale 0-10  ?Pain Score 0  ?Take Vital Signs  ?Increase Vital Sign Frequency  Yellow: Q 2hr X 2 then Q 4hr X 2, if remains yellow, continue Q 4hrs  ?Escalate  ?MEWS: Escalate Yellow: discuss with charge nurse/RN and consider discussing with provider and RRT  ?Notify: Charge Nurse/RN  ?Name of Charge Nurse/RN Notified Colletta [place], RN  ?Date Charge Nurse/RN Notified 10/16/21  ?Time Charge Nurse/RN Notified 1610  ?Document  ?Progress note created (see row info) Yes  ?Assess: SIRS CRITERIA  ?SIRS Temperature  0  ?SIRS Pulse 0  ?SIRS Respirations  0  ?SIRS WBC 0  ?SIRS Score Sum  0  ? ? ?

## 2021-10-17 ENCOUNTER — Other Ambulatory Visit: Payer: Self-pay

## 2021-10-17 ENCOUNTER — Encounter: Payer: Self-pay | Admitting: Psychiatry

## 2021-10-17 ENCOUNTER — Inpatient Hospital Stay
Admission: AD | Admit: 2021-10-17 | Discharge: 2021-11-05 | DRG: 885 | Disposition: A | Discharge status: Home / Self Care | Payer: No Typology Code available for payment source | Source: Intra-hospital | Attending: Psychiatry | Admitting: Psychiatry

## 2021-10-17 DIAGNOSIS — Z91148 Patient's other noncompliance with medication regimen for other reason: Secondary | ICD-10-CM | POA: Diagnosis not present

## 2021-10-17 DIAGNOSIS — I9589 Other hypotension: Secondary | ICD-10-CM | POA: Diagnosis not present

## 2021-10-17 DIAGNOSIS — Z20822 Contact with and (suspected) exposure to covid-19: Secondary | ICD-10-CM | POA: Diagnosis present

## 2021-10-17 DIAGNOSIS — F203 Undifferentiated schizophrenia: Secondary | ICD-10-CM | POA: Diagnosis not present

## 2021-10-17 DIAGNOSIS — F17210 Nicotine dependence, cigarettes, uncomplicated: Secondary | ICD-10-CM | POA: Diagnosis present

## 2021-10-17 DIAGNOSIS — F200 Paranoid schizophrenia: Secondary | ICD-10-CM | POA: Diagnosis not present

## 2021-10-17 MED ORDER — CLONAZEPAM 0.25 MG PO TBDP
0.2500 mg | ORAL_TABLET | Freq: Two times a day (BID) | ORAL | Status: DC
  Administered 2021-10-17 – 2021-11-05 (×38): 0.25 mg via ORAL
  Filled 2021-10-17 (×38): qty 1

## 2021-10-17 MED ORDER — MIRTAZAPINE 15 MG PO TBDP
7.5000 mg | ORAL_TABLET | Freq: Every day | ORAL | Status: DC
  Administered 2021-10-17 – 2021-11-04 (×19): 7.5 mg via ORAL
  Filled 2021-10-17 (×21): qty 0.5

## 2021-10-17 MED ORDER — BENZTROPINE MESYLATE 1 MG PO TABS
0.5000 mg | ORAL_TABLET | Freq: Two times a day (BID) | ORAL | Status: DC | PRN
  Administered 2021-11-02: 0.5 mg via ORAL

## 2021-10-17 MED ORDER — OLANZAPINE 5 MG PO TBDP
5.0000 mg | ORAL_TABLET | Freq: Every day | ORAL | Status: DC
  Administered 2021-10-17 – 2021-11-04 (×19): 5 mg via ORAL
  Filled 2021-10-17 (×19): qty 1

## 2021-10-17 MED ORDER — MENTHOL 3 MG MT LOZG: 1.0000 | LOZENGE | OROMUCOSAL | Status: DC | PRN

## 2021-10-17 MED ORDER — MAGNESIUM HYDROXIDE 400 MG/5ML PO SUSP: 30.0000 mL | Freq: Every day | ORAL | Status: DC | PRN

## 2021-10-17 MED ORDER — ACETAMINOPHEN 325 MG PO TABS: 650.0000 mg | ORAL_TABLET | Freq: Four times a day (QID) | ORAL | Status: DC | PRN

## 2021-10-17 MED ORDER — ALUM & MAG HYDROXIDE-SIMETH 200-200-20 MG/5ML PO SUSP: 30.0000 mL | ORAL | Status: DC | PRN

## 2021-10-17 MED ORDER — TRAZODONE HCL 50 MG PO TABS: 50.0000 mg | ORAL_TABLET | Freq: Every evening | ORAL | Status: DC | PRN

## 2021-10-17 MED ORDER — THIAMINE HCL 100 MG PO TABS: 100.0000 mg | ORAL_TABLET | Freq: Every day | ORAL | Status: DC

## 2021-10-17 MED ORDER — HALOPERIDOL 5 MG PO TABS: 5.0000 mg | ORAL_TABLET | Freq: Two times a day (BID) | ORAL | Status: DC

## 2021-10-17 MED ORDER — MIDODRINE HCL 2.5 MG PO TABS: 2.5000 mg | ORAL_TABLET | Freq: Three times a day (TID) | ORAL | 0 refills | Status: DC

## 2021-10-17 MED ORDER — MIDODRINE HCL 5 MG PO TABS
2.5000 mg | ORAL_TABLET | Freq: Three times a day (TID) | ORAL | Status: DC
Start: 2021-10-17 — End: 2021-11-05
  Administered 2021-10-17 – 2021-11-05 (×58): 2.5 mg via ORAL
  Filled 2021-10-17 (×61): qty 0.5

## 2021-10-17 MED ORDER — ADULT MULTIVITAMIN W/MINERALS CH: 1.0000 | ORAL_TABLET | Freq: Every day | ORAL | Status: DC

## 2021-10-17 MED ORDER — PROSOURCE PLUS PO LIQD
30.0000 mL | Freq: Two times a day (BID) | ORAL | Status: DC
  Administered 2021-10-17 – 2021-11-05 (×37): 30 mL via ORAL
  Filled 2021-10-17 (×39): qty 30

## 2021-10-17 MED ORDER — ADULT MULTIVITAMIN W/MINERALS CH
1.0000 | ORAL_TABLET | Freq: Every day | ORAL | Status: DC
  Administered 2021-10-18 – 2021-11-05 (×19): 1 via ORAL
  Filled 2021-10-17 (×19): qty 1

## 2021-10-17 MED ORDER — MIRTAZAPINE 15 MG PO TBDP: 7.5000 mg | ORAL_TABLET | Freq: Every day | ORAL | 0 refills | Status: DC

## 2021-10-17 MED ORDER — HALOPERIDOL 5 MG PO TABS
5.0000 mg | ORAL_TABLET | Freq: Two times a day (BID) | ORAL | Status: DC
  Administered 2021-10-17 – 2021-11-05 (×38): 5 mg via ORAL
  Filled 2021-10-17 (×38): qty 1

## 2021-10-17 MED ORDER — BOOST / RESOURCE BREEZE PO LIQD CUSTOM
1.0000 | Freq: Three times a day (TID) | ORAL | Status: DC
Start: 2021-10-17 — End: 2021-10-17

## 2021-10-17 MED ORDER — THIAMINE HCL 100 MG PO TABS
100.0000 mg | ORAL_TABLET | Freq: Every day | ORAL | Status: DC
  Administered 2021-10-18 – 2021-11-05 (×19): 100 mg via ORAL
  Filled 2021-10-17 (×19): qty 1

## 2021-10-17 NOTE — Plan of Care (Signed)
Transfer to Geri/Psych department  ?Legal guardian aware per MD notification ?Problem: Education: ?Goal: Knowledge of General Education information will improve ?Description: Including pain rating scale, medication(s)/side effects and non-pharmacologic comfort measures ?Outcome: Adequate for Discharge ?  ?Problem: Health Behavior/Discharge Planning: ?Goal: Ability to manage health-related needs will improve ?Outcome: Adequate for Discharge ?  ?Problem: Clinical Measurements: ?Goal: Ability to maintain clinical measurements within normal limits will improve ?Outcome: Adequate for Discharge ?Goal: Will remain free from infection ?Outcome: Adequate for Discharge ?Goal: Diagnostic test results will improve ?Outcome: Adequate for Discharge ?Goal: Respiratory complications will improve ?Outcome: Adequate for Discharge ?Goal: Cardiovascular complication will be avoided ?Outcome: Adequate for Discharge ?  ?Problem: Elimination: ?Goal: Will not experience complications related to bowel motility ?Outcome: Adequate for Discharge ?Goal: Will not experience complications related to urinary retention ?Outcome: Adequate for Discharge ?  ?

## 2021-10-17 NOTE — Progress Notes (Signed)
Covid test previously negative and patient in isolation. Patient's isolation discontinued at 2200, due to negative test ? ?Patient took all her medications as prescribed ?

## 2021-10-17 NOTE — Discharge Summary (Signed)
? ?Physician Discharge Summary  ?[name] [id number] DOB: 1961-05-27 DOA: 10/02/2021 ? ?PCP: [facility] ? ?Admit date: 10/02/2021 ?Discharge date: 10/17/2021 ? ?Discharge disposition: Geriatric psychiatric ward ? ? ?Recommendations for Outpatient Follow-Up:  ? ?Follow-up with psychiatrist for close monitoring ?Follow-up with PCP in 2 weeks ? ? ?Discharge Diagnosis:  ? ?Principal Problem: ?  Schizophrenia, paranoid ([place]) ?Active Problems: ?  Sinus bradycardia ?  Hypokalemia, hypomagnesemia and hypophosphatemia ?  Protein-calorie malnutrition, severe ([place]) ?  Anemia of chronic disease ?  Hypotension ?  Ventricular tachycardia ? ? ? ?Discharge Condition: Stable. ? ?Diet recommendation:  ?Diet Order   ? ?       ?  Diet general       ?  ?  Diet regular Room service appropriate? Yes; Fluid consistency: Thin  Diet effective now       ?  ? ?  ?  ? ?  ? ? ?  Code Status: Full Code ? ? ? ? ?Hospital Course:  ? ?Ms. [name] is a 61 y.o. female with history of paranoid schizophrenia, anemia was initially admitted to [facility] long hospital on 09/12/2021 the patient had a long hospitalization and the decision was made to transfer the patient on 10/02/2021 over at [facility] for ECT treatments for paranoid schizophrenia because she was not eating.  The patient had an NG tube placed.  She had hypokalemia, hypophosphatemia and hyponatremia that were treated.  She has bradycardia and hypotension which appears to be chronic and she is asymptomatic from this.  She is on midodrine for hypotension.  ? ?She had her first ECT treatment treatment on 10/06/2021.  She had a total of 4 treatments with ECT. She had an episode of ventricular tachycardia during the ECT on 10/13/2021.  Cardiology was consulted to assist with management. ?2D echo and nuclear stress test were unremarkable and she was cleared for additional ECT service.  However, ECT was not continued because her condition had improved.  She is still  showing signs of schizophrenia so Dr. Weber Cooks, psychiatrist, recommended transfer to geriatric psychiatric ward for further management. ? ?Discharge plan was discussed with [name], sister and legal guardian, and she is agreeable with the plan. ? ? ? ? ?Medical Consultants:  ? ?Psychiatrist ?Cardiologist ? ? ?Discharge Exam:  ? ? ?Vitals:  ? 10/16/21 2020 10/16/21 2211 10/17/21 0400 10/17/21 1010  ?BP: (!) 97/50 (!) 98/58 99/61 (!) 94/57  ?Pulse: (!) 44 (!) 51 (!) 53 60  ?Resp: 17 20 19 15   ?Temp: 97.6 ?F (36.4 ?C) 97.6 ?F (36.4 ?C) 98.1 ?F (36.7 ?C) 97.9 ?F (36.6 ?C)  ?TempSrc:  Oral Oral Oral  ?SpO2: 100% 100% 100% 100%  ?Weight:      ?Height:      ? ? ? ?GEN: NAD ?SKIN: Warm and dry ?EYES: EOMI ?ENT: MMM ?CV: RRR ?PULM: CTA B ?ABD: soft, ND, NT, +BS ?CNS: AAO x 2 (person and place), non focal ?EXT: No edema or tenderness ?PSYCH: Flat affect, poor insight and judgment ? ? ?The results of significant diagnostics from this hospitalization (including imaging, microbiology, ancillary and laboratory) are listed below for reference.   ? ? ?Procedures and Diagnostic Studies:  ? ?NM Myocar Multi W/Spect W/Wall Motion / EF ? ?Result Date: 10/15/2021 ?  The study is normal. The study is low risk.   No ST deviation was noted.   LV perfusion is normal. There is no evidence of ischemia. There is no evidence of  infarction.   Left ventricular function is normal. End diastolic cavity size is normal. End systolic cavity size is normal.  ? ?ECHOCARDIOGRAM COMPLETE ? ?Result Date: 10/14/2021 ?   ECHOCARDIOGRAM REPORT   Patient Name:   [name] Date of Exam: 10/14/2021 Medical Rec #:  [id number]   Height:       64.0 in Accession #:    [id number]  Weight:       98.5 lb Date of Birth:  Apr 30, 1961   BSA:          1.448 m? Patient Age:    62 years    BP:           91/53 mmHg Patient Gender: F           HR:           63 bpm. Exam Location:  [facility] Procedure: 2D Echo, Cardiac Doppler and Color Doppler Indications:     I47.2  Ventricular tachycardia  History:         Patient has prior history of Echocardiogram examinations. Risk                  Factors:Current Smoker.  Sonographer:     Rosalia Hammers Referring Phys:  [id number] Sharen Hones Diagnosing Phys: Kathlyn Sacramento MD  Sonographer Comments: Image acquisition challenging due to respiratory motion and Image acquisition challenging due to patient body habitus. IMPRESSIONS  1. Left ventricular ejection fraction, by estimation, is 55 to 60%. The left ventricle has normal function. The left ventricle has no regional wall motion abnormalities. Left ventricular diastolic parameters are consistent with Grade I diastolic dysfunction (impaired relaxation).  2. Right ventricular systolic function is normal. The right ventricular size is normal.  3. The mitral valve is normal in structure. No evidence of mitral valve regurgitation. No evidence of mitral stenosis.  4. The aortic valve is normal in structure. Aortic valve regurgitation is not visualized. Aortic valve sclerosis is present, with no evidence of aortic valve stenosis.  5. The inferior vena cava is normal in size with greater than 50% respiratory variability, suggesting right atrial pressure of 3 mmHg. FINDINGS  Left Ventricle: Left ventricular ejection fraction, by estimation, is 55 to 60%. The left ventricle has normal function. The left ventricle has no regional wall motion abnormalities. The left ventricular internal cavity size was normal in size. There is  no left ventricular hypertrophy. Left ventricular diastolic parameters are consistent with Grade I diastolic dysfunction (impaired relaxation). Right Ventricle: The right ventricular size is normal. No increase in right ventricular wall thickness. Right ventricular systolic function is normal. Left Atrium: Left atrial size was normal in size. Right Atrium: Right atrial size was normal in size. Pericardium: There is no evidence of pericardial effusion. Mitral Valve: The mitral  valve is normal in structure. No evidence of mitral valve regurgitation. No evidence of mitral valve stenosis. MV peak gradient, 2.5 mmHg. The mean mitral valve gradient is 1.0 mmHg. Tricuspid Valve: The tricuspid valve is normal in structure. Tricuspid valve regurgitation is trivial. No evidence of tricuspid stenosis. Aortic Valve: The aortic valve is normal in structure. Aortic valve regurgitation is not visualized. Aortic valve sclerosis is present, with no evidence of aortic valve stenosis. Aortic valve mean gradient measures 4.0 mmHg. Aortic valve peak gradient measures 6.8 mmHg. Aortic valve area, by VTI measures 2.42 cm?. Pulmonic Valve: The pulmonic valve was normal in structure. Pulmonic valve regurgitation is not visualized. No evidence of pulmonic stenosis. Aorta: The aortic root is  normal in size and structure. Venous: The inferior vena cava is normal in size with greater than 50% respiratory variability, suggesting right atrial pressure of 3 mmHg. IAS/Shunts: No atrial level shunt detected by color flow Doppler.  LEFT VENTRICLE PLAX 2D LVIDd:         3.48 cm   Diastology LV PW:         0.61 cm   LV e' medial:    8.55 cm/s LV IVS:        0.44 cm   LV E/e' medial:  7.4 LVOT diam:     1.90 cm   LV e' lateral:   14.80 cm/s LV SV:         61        LV E/e' lateral: 4.3 LV SV Index:   42 LVOT Area:     2.84 cm?  RIGHT VENTRICLE RV Basal diam:  2.71 cm RV S prime:     14.10 cm/s LEFT ATRIUM           Index        RIGHT ATRIUM           Index LA diam:      2.40 cm 1.66 cm/m?   RA Area:     11.50 cm? LA Vol (A4C): 21.5 ml 14.85 ml/m?  RA Volume:   24.40 ml  16.85 ml/m?  AORTIC VALVE AV Area (Vmax):    2.75 cm? AV Area (Vmean):   2.29 cm? AV Area (VTI):     2.42 cm? AV Vmax:           130.00 cm/s AV Vmean:          89.900 cm/s AV VTI:            0.251 m AV Peak Grad:      6.8 mmHg AV Mean Grad:      4.0 mmHg LVOT Vmax:         126.00 cm/s LVOT Vmean:        72.700 cm/s LVOT VTI:          0.214 m LVOT/AV VTI  ratio: 0.85  AORTA Ao Root diam: 2.60 cm MITRAL VALVE MV Area (PHT): 4.17 cm?    SHUNTS MV Area VTI:   2.95 cm?    Systemic VTI:  0.21 m MV Peak grad:  2.5 mmHg    Systemic Diam: 1.90 cm MV Mean grad:  1.0 mmHg MV Vmax:

## 2021-10-17 NOTE — Progress Notes (Signed)
Recreation Therapy Notes ? ?INPATIENT RECREATION TR PLAN ? ?Patient Details ?Name: [name] ?MRN: [id number] ?DOB: 1961-06-06 ?Today's Date: 10/17/2021 ? ?Rec Therapy Plan ?Is patient appropriate for Therapeutic Recreation?: Yes ?Treatment times per week: at least 3 ?Estimated Length of Stay: 5-7 days ?TR Treatment/Interventions: Group participation (Comment) ? ?Discharge Criteria ?Pt will be discharged from therapy if:: Discharged ?Treatment plan/goals/alternatives discussed and agreed upon by:: Patient/family ? ?Discharge Summary ?  ? ? ?Darrol Brandenburg ?10/17/2021, 3:20 PM ?

## 2021-10-17 NOTE — H&P (Signed)
Psychiatric Admission Assessment Adult ? ?Patient Identification: [name] ?MRN:  [id number] ?Date of Evaluation:  10/17/2021 ?Chief Complaint:  Schizophrenia, undifferentiated (HCC) [F20.3] ?Principal Diagnosis: Schizophrenia, undifferentiated (HCC) ?Diagnosis:  Principal Problem: ?  Schizophrenia, undifferentiated (HCC) ? ?History of Present Illness: [name] is a 61 year old African-American female who was admitted to medicine and transferred to psychiatry on commitment for schizophrenia and failure to thrive.  Dr. Toni Amendlapacs has been doing ECT with her because she was catatonic on admission.  She has been eating and drinking some since her treatments and more cooperative.  She is able to state her name.  She is alert.  She is a poor historian and a difficult interview.  According to the chart she has not been taking her medications at home and smoking a lot of cigarettes.  She denies SI. ? ?[name] is a 61 y.o. female with history of paranoid schizophrenia, anemia was initially admitted to [facility] on 09/12/2021 the patient had a long hospitalization and the decision was made to transfer the patient on 10/02/2021 over at [facility] for ECT treatments for paranoid schizophrenia because she was not eating.  The patient had an NG tube placed.  The patient was treated for electrolyte abnormalities during the hospital course.  The patient had her first ECT treatment treatment on 10/06/2021.  She had an episode of ventricular tachycardia during the ECT.  Cardiology was consulted to assist with management. ? ?Associated Signs/Symptoms: ?Depression Symptoms:  depressed mood, ?Duration of Depression Symptoms: Greater than two weeks ? ?(Hypo) Manic Symptoms:  Hallucinations, ?Anxiety Symptoms:  Social Anxiety, ?Psychotic Symptoms:  Hallucinations: Auditory ?PTSD Symptoms: ?NA ?Total Time spent with patient: 1 hour ? ?Past Psychiatric History: Schizophrenia ? ?Is the patient at risk to self?  Yes.    ?Has the patient been a risk to self in the past 6 months? Yes.    ?Has the patient been a risk to self within the distant past? Yes.    ?Is the patient a risk to others? No.  ?Has the patient been a risk to others in the past 6 months? No.  ?Has the patient been a risk to others within the distant past? No.  ? ?Prior Inpatient Therapy:   ?Prior Outpatient Therapy:   ? ?Alcohol Screening:   ?Substance Abuse History in the last 12 months:  No. ?Consequences of Substance Abuse: ?NA ?Previous Psychotropic Medications: Yes  ?Psychological Evaluations: No  ?Past Medical History:  ?Past Medical History:  ?Diagnosis Date  ? Anemia 10/02/2021  ? Non compliance w medication regimen   ? Schizophrenia (HCC)   ?  ?Past Surgical History:  ?Procedure Laterality Date  ? BIOPSY  09/25/2021  ? Procedure: BIOPSY;  Surgeon: Rachael FeeJacobs, Daniel P, MD;  [facility];  Service: Gastroenterology;;  ? ESOPHAGOGASTRODUODENOSCOPY (EGD) WITH PROPOFOL [name] 09/25/2021  ? Procedure: ESOPHAGOGASTRODUODENOSCOPY (EGD) WITH PROPOFOL;  Surgeon: Rachael FeeJacobs, Daniel P, MD;  Location: [facility];  Service: Gastroenterology;  Laterality: [name];  With NGT placement  ? ?Family History: No family history on file. ?Family Psychiatric  History: Unknown ?Tobacco Screening:   ?Social History:  ?Social History  ? ?Substance and Sexual Activity  ?Alcohol Use Not Currently  ? Comment: Refuses to disclose how much  ?   ?Social History  ? ?Substance and Sexual Activity  ?Drug Use No  ? Comment: Refuses to answer  ?  ?Additional Social History: ? She lives in [place] and has a legal guardian. ?   ?  ?  ?  ?  ?  ?  ?  ?  ?  ?  ? ?  Allergies:   ?Allergies  ?Allergen Reactions  ? Aspirin Hives and Other (See Comments)  ?  Possibly caused hives, per sister  ? Ziprasidone Hcl Rash and Other (See Comments)  ?  Caused twitching  ? ?Lab Results:  ?Results for orders placed or performed during the hospital encounter of 10/02/21 (from the past 48 hour(s))  ?Glucose,  capillary     Status: None  ? Collection Time: 10/16/21  5:11 AM  ?Result Value Ref Range  ? Glucose-Capillary 86 70 - 99 mg/dL  ?  Comment: Glucose reference range applies only to samples taken after fasting for at least 8 hours.  ?Basic metabolic panel     Status: None  ? Collection Time: 10/16/21  6:06 AM  ?Result Value Ref Range  ? Sodium 138 135 - 145 mmol/L  ? Potassium 3.9 3.5 - 5.1 mmol/L  ? Chloride 102 98 - 111 mmol/L  ? CO2 29 22 - 32 mmol/L  ? Glucose, Bld 88 70 - 99 mg/dL  ?  Comment: Glucose reference range applies only to samples taken after fasting for at least 8 hours.  ? BUN 15 6 - 20 mg/dL  ? Creatinine, Ser 0.70 0.44 - 1.00 mg/dL  ? Calcium 8.9 8.9 - 10.3 mg/dL  ? GFR, Estimated >60 >60 mL/min  ?  Comment: (NOTE) ?Calculated using the CKD-EPI Creatinine Equation (2021) ?  ? Anion gap 7 5 - 15  ?  Comment: Performed at [facility], [address], [place], [place] [address]  ?Magnesium     Status: Abnormal  ? Collection Time: 10/16/21  6:06 AM  ?Result Value Ref Range  ? Magnesium 2.5 (H) 1.7 - 2.4 mg/dL  ?  Comment: Performed at [facility], [address], [place], [place] [address]  ?Phosphorus     Status: None  ? Collection Time: 10/16/21  6:06 AM  ?Result Value Ref Range  ? Phosphorus 4.6 2.5 - 4.6 mg/dL  ?  Comment: Performed at [facility], [address] [place], [place] [address]  ?Resp Panel by RT-PCR (Flu A&B, Covid) Nasopharyngeal Swab     Status: None  ? Collection Time: 10/16/21  7:00 PM  ? Specimen: Nasopharyngeal Swab; Nasopharyngeal(NP) swabs in vial transport medium  ?Result Value Ref Range  ? SARS Coronavirus 2 by RT PCR NEGATIVE NEGATIVE  ?  Comment: (NOTE) ?SARS-CoV-2 target nucleic acids are NOT DETECTED. ? ?The SARS-CoV-2 RNA is generally detectable in upper respiratory ?specimens during the acute phase of infection. The lowest ?concentration of SARS-CoV-2 viral copies this assay can detect is ?138 copies/mL. A negative result does not  preclude SARS-Cov-2 ?infection and should not be used as the sole basis for treatment or ?other patient management decisions. A negative result may occur with  ?improper specimen collection/handling, submission of specimen other ?than nasopharyngeal swab, presence of viral mutation(s) within the ?areas targeted by this assay, and inadequate number of viral ?copies(<138 copies/mL). A negative result must be combined with ?clinical observations, patient history, and epidemiological ?information. The expected result is Negative. ? ?Fact Sheet for Patients:  ?BloggerCourse.com ? ?Fact Sheet for Healthcare Providers:  ?SeriousBroker.it ? ?This test is no t yet approved or cleared by the [place] FDA and  ?has been authorized for detection and/or diagnosis of SARS-CoV-2 by ?FDA under an Emergency Use Authorization (EUA). This EUA will remain  ?in effect (meaning this test can be used) for the duration of the ?COVID-19 declaration under Section 564(b)(1) of the Act, 21 ?  U.S.C.section 360bbb-3(b)(1), unless the authorization is terminated  ?or revoked sooner.  ? ? ?  ? Influenza A by PCR NEGATIVE NEGATIVE  ? Influenza B by PCR NEGATIVE NEGATIVE  ?  Comment: (NOTE) ?The Xpert Xpress SARS-CoV-2/FLU/RSV plus assay is intended as an aid ?in the diagnosis of influenza from Nasopharyngeal swab specimens and ?should not be used as a sole basis for treatment. Nasal washings and ?aspirates are unacceptable for Xpert Xpress SARS-CoV-2/FLU/RSV ?testing. ? ?Fact Sheet for Patients: ?BloggerCourse.com ? ?Fact Sheet for Healthcare Providers: ?SeriousBroker.it ? ?This test is not yet approved or cleared by the [place] FDA and ?has been authorized for detection and/or diagnosis of SARS-CoV-2 by ?FDA under an Emergency Use Authorization (EUA). This EUA will remain ?in effect (meaning this test can be used) for the duration of  the ?COVID-19 declaration under Section 564(b)(1) of the Act, 21 U.S.C. ?section 360bbb-3(b)(1), unless the authorization is terminated or ?revoked. ? ?Performed at [facility], [address] [place]

## 2021-10-17 NOTE — Tx Team (Signed)
Initial Treatment Plan ?10/17/2021 ?1:20 PM ?[name] ?[id number] ? ? ? ?PATIENT STRESSORS: ?Health problems   ? ? ?PATIENT STRENGTHS: ?Other: Unable to determined at this time ? ? ?PATIENT IDENTIFIED PROBLEMS: ?Patient is unable to identify problem due to disorganized thought process  ?  ?  ?  ?  ?  ?  ?  ?  ?  ? ?DISCHARGE CRITERIA:  ?Improved stabilization in mood, thinking, and/or behavior ?Motivation to continue treatment in a less acute level of care ? ?PRELIMINARY DISCHARGE PLAN: ?Return to previous living arrangement ? ?PATIENT/FAMILY INVOLVEMENT: ?This treatment plan has been presented to and reviewed with the patient, Tacha Manni.  The patient have been given the opportunity to ask questions and make suggestions. ? ?Deland Slocumb, RN ?10/17/2021, 1:20 PM ?

## 2021-10-17 NOTE — BH Assessment (Signed)
Patient has been accepted to [facility]  ?Accepting physician is Dr. Toni Amend.  ?Attending  Physician will be Dr. Kandra Nicolas.  ?Patient has been assigned to room L31, by [facility] Charge Nurse Minouge Hyppolite.   ?Call report to [phone]  ?Representative/Transfer Coordinator is Jerilynn Som ?Patient pre-admitted by [facility] Patient Access Sue Lush) ? ?[facility] 2C Staff (Dianna, RN) made aware of acceptance. ?

## 2021-10-17 NOTE — Progress Notes (Signed)
Patient was previously a Yellow MEWS at 1805 and again at 2020. Vital signs rechecked at 2211 showed patient as a GREEN MEWS which was a change from previous status. See vitals below ? ? ? 10/16/21 2211  ?Assess: MEWS Score  ?Temp 97.6 ?F (36.4 ?C)  ?BP (!) 98/58  ?Pulse Rate (!) 51  ?Resp 20  ?SpO2 100 %  ?O2 Device Room Air  ?Assess: MEWS Score  ?MEWS Temp 0  ?MEWS Systolic 1  ?MEWS Pulse 0  ?MEWS RR 0  ?MEWS LOC 0  ?MEWS Score 1  ?MEWS Score Color Green  ?Assess: if the MEWS score is Yellow or Red  ?Were vital signs taken at a resting state? Yes  ?Focused Assessment Change from prior assessment (see assessment flowsheet)  ?Does the patient meet 2 or more of the SIRS criteria? No  ?MEWS guidelines implemented *See Row Information* No, other (Comment) ?(MEWS is currently green)  ?Treat  ?Pain Scale 0-10  ?Pain Score 0  ?Notify: Charge Nurse/RN  ?Name of Charge Nurse/RN Notified Tawanna Cooler, RN ?(Informed of Green MEWS)  ?Date Charge Nurse/RN Notified 10/16/21  ?Time Charge Nurse/RN Notified 2211  ?Document  ?Patient Outcome Stabilized after interventions  ?Progress note created (see row info) Yes  ?Assess: SIRS CRITERIA  ?SIRS Temperature  0  ?SIRS Pulse 0  ?SIRS Respirations  0  ?SIRS WBC 0  ?SIRS Score Sum  0  ? ? ?

## 2021-10-17 NOTE — Progress Notes (Signed)
NUTRITION ASSESSMENT ? ?Pt identified as at risk on the Malnutrition Screen Tool ? ?INTERVENTION: ? ?-D/c Boost Breeze ?-MVI with minerals daily ?-30 ml Prosource BID, each supplement provides 100 kcals and 15 grams ? ?NUTRITION DIAGNOSIS: ?Unintentional weight loss related to sub-optimal intake as evidenced by pt report.  ? ?Goal: ?Pt to meet >/= 90% of their estimated nutrition needs. ? ?Monitor:  ?PO intake ? ?Assessment:  ?[name] is a 61 year old African-American female who was admitted to medicine and transferred to psychiatry on commitment for schizophrenia and failure to thrive.  Dr. Toni Amend has been doing ECT with her because she was catatonic on admission.  She has been eating and drinking some since her treatments and more cooperative.  She is able to state her name.  She is alert.  She is a poor historian and a difficult interview.  According to the chart she has not been taking her medications at home and smoking a lot of cigarettes.  She denies SI. ? ?Pt admitted with schizophrenia.  ? ?Pt familiar to this RD due to inpatient admission. Intake has improved. She is currently on regular diet. No meal completion data available to assess at this time.  ? ?Pt does not like milky supplements. Per dining services, no more Boost Breeze in stock. Will use Prosource.  ? ?Reviewed wt hx; pt has expeinenced a 10.8% wt loss over the past year. While this is not significant for time frame, it is concerning given pt's history of malnutrition.  ? ?Medications reviewed and include remeron.  ? ?Labs reviewed: CBGS: 86-97.  ? ?61 y.o. female ? ?Height: ?Ht Readings from Last 1 Encounters:  ?10/17/21 5\' 4"  (1.626 m)  ? ? ?Weight: ?Wt Readings from Last 1 Encounters:  ?10/17/21 44.5 kg  ? ? ?Weight Hx: ?Wt Readings from Last 10 Encounters:  ?10/17/21 44.5 kg  ?10/16/21 47.6 kg  ?09/28/21 43.3 kg  ?10/05/20 49.9 kg  ?04/04/20 52.2 kg  ?03/26/20 50.3 kg  ?03/25/20 50.3 kg  ?11/17/17 68 kg  ?04/12/15 64 kg  ?11/06/11 58.1 kg   ? ? ?BMI:  Body mass index is 16.82 kg/m?11/08/11 ?Pt meets criteria for underweight based on current BMI. ? ?Estimated Nutritional Needs: ?Kcal: 25-30 kcal/kg ?Protein: > 1 gram protein/kg ?Fluid: 1 ml/kcal ? ?Diet Order:  ?Diet Order   ? ?       ?  Diet regular Room service appropriate? Yes; Fluid consistency: Thin  Diet effective now       ?  ? ?  ?  ? ?  ? ?Pt is also offered choice of unit snacks mid-morning and mid-afternoon.  ?Pt is eating as desired.  ? ?Lab results and medications reviewed.  ? ?Marland Kitchen, RD, LDN, CDCES ?Registered Dietitian II ?Certified Diabetes Care and Education Specialist ?Please refer to [facility] for RD and/or RD on-call/weekend/after hours pager   ?

## 2021-10-17 NOTE — Progress Notes (Signed)
Physical Therapy Treatment ?Patient Details ?Name: [name] ?MRN: [id number] ?DOB: 1960/08/17 ?Today's Date: 10/17/2021 ? ? ?History of Present Illness [name] is a 60yoF who comes to [facility] direct transfer from [facility] for ECT therapy. At home pt was experiencing hallucinations and delusional thoughts, known history of paranoid schizophrenia and recent medication noncompliance. Pt was not eating or drinking at home. ? ?  ?PT Comments  ? ? Pt resting in bed upon PT arrival; agreeable to PT session.  During session pt modified independent with bed mobility; independent with transfers; and CGA to SBA ambulating 360 feet (no AD use).   Very mild drifting at times or occasional altered stepping pattern noted during ambulation (close SBA to CGA for safety) but pt able to self correct (anticipate pt may benefit from RW when pt more fatigued).  Performed standing LE ex's to improve LE strength and balance.  Will continue to focus on strengthening, balance, and progressive functional mobility during hospitalization. ?  ?Recommendations for follow up therapy are one component of a multi-disciplinary discharge planning process, led by the attending physician.  Recommendations may be updated based on patient status, additional functional criteria and insurance authorization. ? ?Follow Up Recommendations ? No PT follow up ?  ?  ?Assistance Recommended at Discharge Intermittent Supervision/Assistance  ?Patient can return home with the following A little help with walking and/or transfers;Assistance with cooking/housework;Help with stairs or ramp for entrance ?  ?Equipment Recommendations ? Rolling walker (2 wheels)  ?  ?Recommendations for Other Services   ? ? ?  ?Precautions / Restrictions Precautions ?Precautions: Fall ?Restrictions ?Weight Bearing Restrictions: No  ?  ? ?Mobility ? Bed Mobility ?Overal bed mobility: Modified Independent ?  ?  ?  ?  ?  ?  ?General bed mobility comments: Semi-supine to/from sitting without any  noted difficulty ?  ? ?Transfers ?Overall transfer level: Independent ?Equipment used: None ?Transfers: Sit to/from Stand ?Sit to Stand: Independent ?  ?  ?  ?  ?  ?General transfer comment: steady safe transfers noted ?  ? ?Ambulation/Gait ?Ambulation/Gait assistance: Supervision, Min guard ?Gait Distance (Feet): 360 Feet ?Assistive device: None ?  ?Gait velocity: decreased ?  ?  ?General Gait Details: very mild drifting at times or occasional altered stepping pattern (close SBA to CGA for safety) but pt able to self correct ? ? ?Stairs ?  ?  ?  ?  ?  ? ? ?Wheelchair Mobility ?  ? ?Modified Rankin (Stroke Patients Only) ?  ? ? ?  ?Balance Overall balance assessment: Needs assistance ?Sitting-balance support: No upper extremity supported, Feet supported ?Sitting balance-Leahy Scale: Normal ?Sitting balance - Comments: steady sitting reaching outside BOS ?  ?Standing balance support: No upper extremity supported ?Standing balance-Leahy Scale: Good ?Standing balance comment: steady standing washing hands at sink ?  ?  ?  ?  ?  ?  ?  ?  ?  ?  ?  ?  ? ?  ?Cognition Arousal/Alertness: Awake/alert ?Behavior During Therapy: Flat affect ?Overall Cognitive Status: Within Functional Limits for tasks assessed ?  ?  ?  ?  ?  ?  ?  ?  ?  ?  ?  ?  ?  ?  ?  ?  ?General Comments: Pleasant ?  ?  ? ?  ?Exercises General Exercises - Lower Extremity ?Hip ABduction/ADduction: AROM, Strengthening, Both, 10 reps, Standing (with single UE support) ?Hip Flexion/Marching: AROM, Strengthening, Both, 10 reps, Standing (with single UE support) ?Heel Raises: AROM,  Strengthening, Both, 10 reps, Standing (with single UE support) ?Other Exercises ?Other Exercises: x10 standing LE hamstring curls B LE's with single UE support ? ?  ?General Comments  Nursing cleared pt for participation in physical therapy.  Pt agreeable to PT session. ?  ?  ? ?Pertinent Vitals/Pain Pain Assessment ?Pain Assessment: Faces ?Faces Pain Scale: No hurt ?Pain  Intervention(s): Limited activity within patient's tolerance, Monitored during session, Repositioned ?HR stable and WFL throughout treatment session.  ? ? ?Home Living   ?  ?  ?  ?  ?  ?  ?  ?  ?  ?   ?  ?Prior Function    ?  ?  ?   ? ?PT Goals (current goals can now be found in the care plan section) Acute Rehab PT Goals ?Patient Stated Goal: go home ?PT Goal Formulation: With patient ?Progress towards PT goals: Progressing toward goals ? ?  ?Frequency ? ? ? Min 2X/week ? ? ? ?  ?PT Plan Current plan remains appropriate  ? ? ?Co-evaluation   ?  ?  ?  ?  ? ?  ?AM-PAC PT "6 Clicks" Mobility   ?Outcome Measure ? Help needed turning from your back to your side while in a flat bed without using bedrails?: None ?Help needed moving from lying on your back to sitting on the side of a flat bed without using bedrails?: None ?Help needed moving to and from a bed to a chair (including a wheelchair)?: None ?Help needed standing up from a chair using your arms (e.g., wheelchair or bedside chair)?: None ?Help needed to walk in hospital room?: A Little ?Help needed climbing 3-5 steps with a railing? : A Little ?6 Click Score: 22 ? ?  ?End of Session Equipment Utilized During Treatment: Gait belt ?Activity Tolerance: Patient tolerated treatment well ?Patient left: in bed;with call bell/phone within reach;with bed alarm set ?Nurse Communication: Mobility status;Precautions ?PT Visit Diagnosis: Difficulty in walking, not elsewhere classified (R26.2);Unsteadiness on feet (R26.81);Muscle weakness (generalized) (M62.81) ?  ? ? ?Time: [id number] ?PT Time Calculation (min) (ACUTE ONLY): 17 min ? ?Charges:  $Therapeutic Exercise: 8-22 mins          ?          ?Leitha Bleak, PT ?10/17/21, 10:47 AM ? ? ?

## 2021-10-17 NOTE — BHH Suicide Risk Assessment (Signed)
[facility] Admission Suicide Risk Assessment ? ? ?Nursing information obtained from:    ?Demographic factors:    ?Current Mental Status:    ?Loss Factors:    ?Historical Factors:    ?Risk Reduction Factors:    ? ?Total Time spent with patient: 1 hour ?Principal Problem: Schizophrenia, undifferentiated (HCC) ?Diagnosis:  Principal Problem: ?  Schizophrenia, undifferentiated (HCC) ? ?Subjective Data: [name] is a 61 y.o. female with history of paranoid schizophrenia, anemia was initially admitted to [facility] long hospital on 09/12/2021 the patient had a long hospitalization and the decision was made to transfer the patient on 10/02/2021 over at [facility] for ECT treatments for paranoid schizophrenia because she was not eating.  The patient had an NG tube placed.  The patient was treated for electrolyte abnormalities during the hospital course.  The patient had her first ECT treatment treatment on 10/06/2021.  She had an episode of ventricular tachycardia during the ECT.  Cardiology was consulted to assist with management. ? ?Continued Clinical Symptoms:  ?  ?The "Alcohol Use Disorders Identification Test", Guidelines for Use in Primary Care, Second Edition.  World Science writer [name]). ?Score between 0-7:  no or low risk or alcohol related problems. ?Score between 8-15:  moderate risk of alcohol related problems. ?Score between 16-19:  high risk of alcohol related problems. ?Score 20 or above:  warrants further diagnostic evaluation for alcohol dependence and treatment. ? ? ?CLINICAL FACTORS:  ? Schizophrenia:   Depressive state ? ? ?Musculoskeletal: ?Strength & Muscle Tone: decreased ?Gait & Station: unsteady ?Patient leans: [name] ? ?Psychiatric Specialty Exam: ? ?Presentation  ?General Appearance: Disheveled ? ?Eye Contact:Minimal ? ?Speech:Clear and Coherent; Normal Rate ? ?Speech Volume:Normal ? ?Handedness:Right ? ? ?Mood and Affect  ?Mood:Dysphoric; Irritable ? ?Affect:Flat; Blunt ? ? ?Thought  Process  ?Thought Processes:Coherent; Linear ? ?Descriptions of Associations:Intact ? ?Orientation:Partial ? ?Thought Content:Paranoid Ideation ? ?History of Schizophrenia/Schizoaffective disorder:Yes ? ?Duration of Psychotic Symptoms:Greater than six months ? ?Hallucinations:No data recorded ?Ideas of Reference:None ? ?Suicidal Thoughts:No data recorded ?Homicidal Thoughts:No data recorded ? ?Sensorium  ?Memory:Immediate Fair; Recent Fair; Remote Fair ? ?Judgment:Fair ? ?Insight:Fair ? ? ?Executive Functions  ?Concentration:Fair ? ?Attention Span:Fair ? ?Recall:Fair ? ?Fund of Knowledge:Fair ? ?Language:Fair ? ? ?Psychomotor Activity  ?Psychomotor Activity:No data recorded ? ?Assets  ?Assets:Communication Skills; Resilience; Financial Resources/Insurance; Desire for Improvement; Physical Health; Social Support ? ? ?Sleep  ?Sleep:No data recorded ? ? ? ?Blood pressure (!) 95/52, pulse 62, temperature 97.6 ?F (36.4 ?C), temperature source Oral, resp. rate 16, height 5\' 4"  (1.626 m), weight 44.5 kg, SpO2 100 %. Body mass index is 16.82 kg/m?. ? ? ?COGNITIVE FEATURES THAT CONTRIBUTE TO RISK:  ?Loss of executive function   ? ?SUICIDE RISK:  ? Minimal: No identifiable suicidal ideation.  Patients presenting with no risk factors but with morbid ruminations; may be classified as minimal risk based on the severity of the depressive symptoms ? ?PLAN OF CARE: See orders ? ?I certify that inpatient services furnished can reasonably be expected to improve the patient's condition.  ? ? , DO ?10/17/2021, 12:40 PM ? ?

## 2021-10-17 NOTE — Progress Notes (Signed)
Recreation Therapy Notes ? ?Date: 10/17/2021  ?  ?Time: 1:15pm   ?  ?Location: Craft room    ?  ?Behavioral response: Appropriate ?  ?Intervention Topic:  Social Skills  ?  ?Discussion/Intervention:  ?Group content on today was focused on social skills. The group defined social skills and identified ways they use social skills. Patients expressed what obstacles they face when trying to be social. Participants described the importance of social skills. The group listed ways to improve social skills and reasons to improve social skills. Individuals had an opportunity to learn new and improve social skills as well as identify their weaknesses. ?  ?Clinical Observations/Feedback: ?Patient came to group and was not engaged in the group discussion. She participated in the intervention but stayed to herself.  ?Abdon Petrosky LRT/CTRS  ? ? ? ? ? ?Maliik Karner ?10/17/2021 3:30 PM ?

## 2021-10-17 NOTE — Progress Notes (Signed)
Recreation Therapy Notes ? ?INPATIENT RECREATION THERAPY ASSESSMENT ? ?Patient Details ?Name: [name] ?MRN: [id number] ?DOB: 24-Oct-1960 ?Today's Date: 10/17/2021 ?      ?Information Obtained From: ?Patient (Patient stared into space and did not respond to any questions) ? ?Able to Participate in Assessment/Interview: ?  ? ?Patient Presentation: ?  ? ?Reason for Admission (Per Patient): ?  ? ?Patient Stressors: ?  ? ?Coping Skills:   ?  ? ?Leisure Interests (2+):  ?  ? ?Frequency of Recreation/Participation: ?  ? ?Awareness of Community Resources:  ?  ? ?Community Resources:  ?  ? ?Current Use: ?  ? ?If no, Barriers?: ?  ? ?Expressed Interest in [employer] Information: ?  ? ?[place] of Residence:  ?  ? ?Patient Main Form of Transportation: ?  ? ?Patient Strengths:  ?  ? ?Patient Identified Areas of Improvement:  ?  ? ?Patient Goal for Hospitalization:  ?  ? ?Current SI (including self-harm):  ?  ? ?Current HI:  ?  ? ?Current AVH: ?  ? ?Staff Intervention Plan: ?  ? ?Consent to Intern Participation: ?  ? ?Rileyann Florance ?10/17/2021, 3:19 PM ?

## 2021-10-17 NOTE — Progress Notes (Signed)
Patient admitted IVC to [facility] from the ED with diagnosis of schizophrenia, undifferentiated. Patient presents to unit ambulatory A&Ox2 with flat affect, depressed and disorganized thought process. Calm and cooperative during assessment.Patient denies SI, HI, AVH, anxiety, and depression.  Denies pain or discomfort at this time. Patient denies smoking or drug abuse but does endorse daily ETOH use "4 beers a day." Emotional support and reassurance provided throughout admission intake. Patient oriented to unit, room and call light. Body assessment done with 2nd RN, noted with dry flaky skin and dandruff.  Denies any needs at this time. Compliant with due medications. Patient is currently sitting in day room among peers. Will continue to monitor with ongoing Q 15 minute safety checks per unit protocol. ?

## 2021-10-18 NOTE — H&P (Signed)
Psychiatric Admission Assessment Adult ? ?Patient Identification: [name] ?MRN:  [id number] ?Date of Evaluation:  10/18/2021 ?Chief Complaint:  " Some interference".  ?Schizophrenia, undifferentiated (HCC) [F20.3] ?Principal Diagnosis: Schizophrenia, undifferentiated (HCC) ?Diagnosis:  Principal Problem: ?  Schizophrenia, undifferentiated (HCC) ? ?History of Present Illness: [name] is a 61 y.o. female with medical history significant of schizophrenia who was brought into the ED on 2/24 with IVC from family indicating that she has not been taking her medications, hearing voices and having hallucination and delusional thoughts.. Patient seen and chart reviewed.  Spoke with patient's sister and guardian.  Patient says she thinks she is doing okay.  She is eating and has been able to get up and move around the room.  Patient denies having any hallucinations.  She denies any psychiatric symptoms.  She does seem somewhat blunted in her affect and when I talked with her about her home life she showed some signs of possible paranoia.  Seemed overly concerned about how people might take her belongings away from her at her apartment.  I spoke with her sister who admits that she herself has not spoken with the patient.  She expresses concern however that the patient be able to stay on her medicine and be functioning as well. Patient was initially admitted on medical floor due to poor nutrition and was eventually transferred to [facility]. Patient presents now in a passive manner, cannot really provide a coherent history and states that something was causing " Interference" at home. She is denying current SI/HI or any AVH. She appears to have some negative symptoms and paranoia. She is taking her meds here .  ?Associated Signs/Symptoms: ?Depression Symptoms:  depressed mood, ?psychomotor retardation, ?difficulty concentrating, ?loss of energy/fatigue, ?weight loss, ?Duration of Depression Symptoms: Greater than two  weeks ? ?(Hypo) Manic Symptoms:   ?Anxiety Symptoms:  Excessive Worry, ?Psychotic Symptoms:  Delusions, ?Paranoia, ?PTSD Symptoms: ?Negative ?Total Time spent with patient: 1 hour ? ?Past Psychiatric History: Schizophrenia ? ?Is the patient at risk to self? Yes.    ?Has the patient been a risk to self in the past 6 months? Yes.    ?Has the patient been a risk to self within the distant past? Yes.    ?Is the patient a risk to others? No.  ?Has the patient been a risk to others in the past 6 months? No.  ?Has the patient been a risk to others within the distant past? No.  ? ?Prior Inpatient Therapy:   ?Prior Outpatient Therapy:   ? ?Alcohol Screening: 1. How often do you have a drink containing alcohol?: 4 or more times a week ?2. How many drinks containing alcohol do you have on a typical day when you are drinking?: 3 or 4 ?3. How often do you have six or more drinks on one occasion?: Never ?AUDIT-C Score: 5 ?4. How often during the last year have you found that you were not able to stop drinking once you had started?: Never ?5. How often during the last year have you failed to do what was normally expected from you because of drinking?: Never ?6. How often during the last year have you needed a first drink in the morning to get yourself going after a heavy drinking session?: Never ?7. How often during the last year have you had a feeling of guilt of remorse after drinking?: Never ?8. How often during the last year have you been unable to remember what happened the night before because you  had been drinking?: Never ?9. Have you or someone else been injured as a result of your drinking?: No ?10. Has a relative or friend or a doctor or another health worker been concerned about your drinking or suggested you cut down?: No ?Alcohol Use Disorder Identification Test Final Score (AUDIT): 5 ?Substance Abuse History in the last 12 months:  No. ?Consequences of Substance Abuse: ?NA ?Previous Psychotropic Medications: Yes   ?Psychological Evaluations: No  ?Past Medical History:  ?Past Medical History:  ?Diagnosis Date  ? Anemia 10/02/2021  ? Non compliance w medication regimen   ? Schizophrenia (HCC)   ?  ?Past Surgical History:  ?Procedure Laterality Date  ? BIOPSY  09/25/2021  ? Procedure: BIOPSY;  Surgeon: Rachael Fee, MD;  Location: [facility];  Service: Gastroenterology;;  ? ESOPHAGOGASTRODUODENOSCOPY (EGD) WITH PROPOFOL [name] 09/25/2021  ? Procedure: ESOPHAGOGASTRODUODENOSCOPY (EGD) WITH PROPOFOL;  Surgeon: Rachael Fee, MD;  Location: [facility];  Service: Gastroenterology;  Laterality: [name];  With NGT placement  ? ?Family History: History reviewed. No pertinent family history. ?Family Psychiatric  History: denies ?Tobacco Screening:   ?Social History:  ?Social History  ? ?Substance and Sexual Activity  ?Alcohol Use Not Currently  ? Comment: Refuses to disclose how much  ?   ?Social History  ? ?Substance and Sexual Activity  ?Drug Use No  ? Comment: Refuses to answer  ?  ?Additional Social History: ?Marital status: Single ?Are you sexually active?: No ?What is your sexual orientation?: Per guardian, heterosexual. ?Has your sexual activity been affected by drugs, alcohol, medication, or emotional stress?: Unable to assess ?Does patient have children?: No ?   ?  ?  ?  ?  ?  ?  ?  ?  ?  ?  ? ?Allergies:   ?Allergies  ?Allergen Reactions  ? Aspirin Hives and Other (See Comments)  ?  Possibly caused hives, per sister  ? Ziprasidone Hcl Rash and Other (See Comments)  ?  Caused twitching  ? ?Lab Results:  ?Results for orders placed or performed during the hospital encounter of 10/02/21 (from the past 48 hour(s))  ?Resp Panel by RT-PCR (Flu A&B, Covid) Nasopharyngeal Swab     Status: None  ? Collection Time: 10/16/21  7:00 PM  ? Specimen: Nasopharyngeal Swab; Nasopharyngeal(NP) swabs in vial transport medium  ?Result Value Ref Range  ? SARS Coronavirus 2 by RT PCR NEGATIVE NEGATIVE  ?  Comment: (NOTE) ?SARS-CoV-2 target nucleic  acids are NOT DETECTED. ? ?The SARS-CoV-2 RNA is generally detectable in upper respiratory ?specimens during the acute phase of infection. The lowest ?concentration of SARS-CoV-2 viral copies this assay can detect is ?138 copies/mL. A negative result does not preclude SARS-Cov-2 ?infection and should not be used as the sole basis for treatment or ?other patient management decisions. A negative result may occur with  ?improper specimen collection/handling, submission of specimen other ?than nasopharyngeal swab, presence of viral mutation(s) within the ?areas targeted by this assay, and inadequate number of viral ?copies(<138 copies/mL). A negative result must be combined with ?clinical observations, patient history, and epidemiological ?information. The expected result is Negative. ? ?Fact Sheet for Patients:  ?BloggerCourse.com ? ?Fact Sheet for Healthcare Providers:  ?SeriousBroker.it ? ?This test is no t yet approved or cleared by the [place] FDA and  ?has been authorized for detection and/or diagnosis of SARS-CoV-2 by ?FDA under an Emergency Use Authorization (EUA). This EUA will remain  ?in effect (meaning this test can be used) for the duration of  the ?COVID-19 declaration under Section 564(b)(1) of the Act, 21 ?U.S.C.section 360bbb-3(b)(1), unless the authorization is terminated  ?or revoked sooner.  ? ? ?  ? Influenza A by PCR NEGATIVE NEGATIVE  ? Influenza B by PCR NEGATIVE NEGATIVE  ?  Comment: (NOTE) ?The Xpert Xpress SARS-CoV-2/FLU/RSV plus assay is intended as an aid ?in the diagnosis of influenza from Nasopharyngeal swab specimens and ?should not be used as a sole basis for treatment. Nasal washings and ?aspirates are unacceptable for Xpert Xpress SARS-CoV-2/FLU/RSV ?testing. ? ?Fact Sheet for Patients: ?BloggerCourse.comhttps://www.fda.gov/media/152166/download ? ?Fact Sheet for Healthcare Providers: ?SeriousBroker.ithttps://www.fda.gov/media/152162/download ? ?This test is not  yet approved or cleared by the [place] FDA and ?has been authorized for detection and/or diagnosis of SARS-CoV-2 by ?FDA under an Emergency Use Authorization (EUA). This EUA will remain ?in effect (meaning

## 2021-10-18 NOTE — Progress Notes (Signed)
Patient has been quite on shift. She has been medication compliant on shift and cooperative with treatment.  Patient denies SI, HI or AVH.  ?

## 2021-10-18 NOTE — H&P (Signed)
[facility] Admission Suicide Risk Assessment ? ? ?Nursing information obtained from:  Patient ?Demographic factors:  NA ?Current Mental Status:  NA ?Loss Factors:  NA ?Historical Factors:  NA ?Risk Reduction Factors:  NA ? ?Total Time spent with patient: 1 hour ?Principal Problem: Schizophrenia, undifferentiated ([place]) ?Diagnosis:  Principal Problem: ?  Schizophrenia, undifferentiated ([place]) ? ?Subjective Data:  [name] is a 61 y.o. female with medical history significant of schizophrenia who was brought into the ED on 2/24 with IVC from family indicating that she has not been taking her medications, hearing voices and having hallucination and delusional thoughts.. Patient seen and chart reviewed.  Spoke with patient's sister and guardian.  Patient says she thinks she is doing okay.  She is eating and has been able to get up and move around the room.  Patient denies having any hallucinations.  She denies any psychiatric symptoms.  She does seem somewhat blunted in her affect and when I talked with her about her home life she showed some signs of possible paranoia.  Seemed overly concerned about how people might take her belongings away from her at her apartment.  I spoke with her sister who admits that she herself has not spoken with the patient.  She expresses concern however that the patient be able to stay on her medicine and be functioning as well. Patient was initially admitted on medical floor due to poor nutrition and was eventually transferred to [facility]. Patient presents now in a passive manner, cannot really provide a coherent history and states that something was causing " Interference" at home. She is denying current SI/HI or any AVH. She appears to have some negative symptoms and paranoia. She is taking her meds here .  ? ?Continued Clinical Symptoms:  ?Alcohol Use Disorder Identification Test Final Score (AUDIT): 5 ?The "Alcohol Use Disorders Identification Test", Guidelines for Use in Primary Care,  Second Edition.  World Pharmacologist [facility]). ?Score between 0-7:  no or low risk or alcohol related problems. ?Score between 8-15:  moderate risk of alcohol related problems. ?Score between 16-19:  high risk of alcohol related problems. ?Score 20 or above:  warrants further diagnostic evaluation for alcohol dependence and treatment. ? ? ?CLINICAL FACTORS:  ? Depression:   Delusional ?Hopelessness ?Schizophrenia:   Paranoid or undifferentiated type ?Currently Psychotic ?Previous Psychiatric Diagnoses and Treatments ? ? ?Musculoskeletal: ?Strength & Muscle Tone: within normal limits ?Gait & Station: normal ?Patient leans: [name] ? ?Psychiatric Specialty Exam: ? ?Presentation  ?General Appearance: Casual; Appropriate for Environment; Fairly Groomed ? ?Eye Contact:Fair ? ?Speech:Slow ? ?Speech Volume:Decreased ? ?Handedness:Right ? ? ?Mood and Affect  ?Mood:Anxious; Depressed ? ?Affect:Blunt ? ? ?Thought Process  ?Thought Processes:Disorganized ? ?Descriptions of Associations:Loose ? ?Orientation:Partial ? ?Thought Content:Delusions; Illogical; Paranoid Ideation ? ?History of Schizophrenia/Schizoaffective disorder:Yes ? ?Duration of Psychotic Symptoms:Greater than six months ? ?Hallucinations:Hallucinations: None ? ?Ideas of Reference:Paranoia ? ?Suicidal Thoughts:Suicidal Thoughts: No ? ?Homicidal Thoughts:Homicidal Thoughts: No ? ? ?Sensorium  ?Memory:Recent Poor; Remote Poor ? ?Judgment:Impaired ? ?Insight:Fair ? ? ?Executive Functions  ?Concentration:Poor ? ?Attention Span:Poor ? ?Recall:Poor ? ?Fund of Knowledge:Poor ? ?Language:Fair ? ? ?Psychomotor Activity  ?Psychomotor Activity:Psychomotor Activity: Decreased; Psychomotor Retardation ? ? ?Assets  ?Assets:Communication Skills; Resilience; Financial Resources/Insurance; Desire for Improvement; Physical Health; Social Support ? ? ?Sleep  ?Sleep:Sleep: Fair ? ? ? ?Physical Exam: ?Physical Exam ?ROS ?Blood pressure (!) 81/64, pulse 72, temperature 97.9 ?F (36.6  ?C), resp. rate 20, height 5\' 4"  (1.626 m), weight 44.5 kg, SpO2 100 %.  Body mass index is 16.82 kg/m?. ? ? ?COGNITIVE FEATURES THAT CONTRIBUTE TO RISK:  ?None   ? ?SUICIDE RISK:  ? Moderate:  Frequent suicidal ideation with limited intensity, and duration, some specificity in terms of plans, no associated intent, good self-control, limited dysphoria/symptomatology, some risk factors present, and identifiable protective factors, including available and accessible social support. ? ?PLAN OF CARE: Inpatient treatment for safety and stabilization. Medications. therapy ? ?I certify that inpatient services furnished can reasonably be expected to improve the patient's condition.  ? ?Leshonda Galambos Wynell Balloon ?10/18/2021, 1:51 PM ? ?

## 2021-10-18 NOTE — Group Note (Signed)
LCSW Group Therapy Note ? ?Group Date: 10/18/2021 ?Start Time: 1300 ?End Time: 1400 ? ? ?Type of Therapy and Topic:  Group Therapy - Healthy vs Unhealthy Coping Skills ? ?Participation Level:  Did Not Attend  ? ?Description of Group ?The focus of this group was to determine what unhealthy coping techniques typically are used by group members and what healthy coping techniques would be helpful in coping with various problems. Patients were guided in becoming aware of the differences between healthy and unhealthy coping techniques. Patients were asked to identify 2-3 healthy coping skills they would like to learn to use more effectively. ? ?Therapeutic Goals ?Patients learned that coping is what human beings do all day long to deal with various situations in their lives ?Patients defined and discussed healthy vs unhealthy coping techniques ?Patients identified their preferred coping techniques and identified whether these were healthy or unhealthy ?Patients determined 2-3 healthy coping skills they would like to become more familiar with and use more often. ?Patients provided support and ideas to each other ? ? ?Summary of Patient Progress:  Due to limited staffing, group was not held on the unit.  ? ? ?Therapeutic Modalities ?Cognitive Behavioral Therapy ?Motivational Interviewing ? ?Nick Armel K Analycia Khokhar, LCSWA ?10/18/2021  4:29 PM   ?

## 2021-10-18 NOTE — Plan of Care (Signed)
Patient Calm and Cooperative during shift.  Presents Flat and Preoccupied. Pt denies SI/HI/AVH and contracts for safety. Pt verbalized "Wasn't making sense out of me" as reason for admission. Pt did present with delayed response and minimal interaction in therapeutic milieu. Pt states I listen to music on 99.5 as healthy coping skills.  Patient did comply with scheduled medications.  Q 15 minute safety checks in place.  Informed pt to notify nursing staff if needs arise. Will continue plan of care.  ?

## 2021-10-19 NOTE — Progress Notes (Signed)
Patient continues to present Isolative and Flat during shift.  Patient denies SI/HI/AVH and contracts for safety.  Patient did comply with scheduled medications.  Pt did eat hs snack.  Encourage fluids. Pt did have low b/p 90/59 On site APP informed.  Pt in no apparent distress. Informed pt to notify nursing staff if needs arise. Q 15 minute safety checks in place.   ?

## 2021-10-19 NOTE — Group Note (Signed)
LCSW Group Therapy Note ? ?Group Date: 10/19/2021 ?Start Time: 1300 ?End Time: 1350 ? ? ?Type of Therapy and Topic:  Group Therapy - Healthy vs Unhealthy Coping Skills ? ?Participation Level:  Active  ? ?Description of Group ?The focus of this group was to determine what unhealthy coping techniques typically are used by group members and what healthy coping techniques would be helpful in coping with various problems. Patients were guided in becoming aware of the differences between healthy and unhealthy coping techniques. Patients were asked to identify 2-3 healthy coping skills they would like to learn to use more effectively. ? ?Therapeutic Goals ?Patients learned that coping is what human beings do all day long to deal with various situations in their lives ?Patients defined and discussed healthy vs unhealthy coping techniques ?Patients identified their preferred coping techniques and identified whether these were healthy or unhealthy ?Patients determined 2-3 healthy coping skills they would like to become more familiar with and use more often. ?Patients provided support and ideas to each other ? ? ?Summary of Patient Progress: Patient was present for the entirety of the group session. Patient was an active listener and shared when invited. Patient presented as reserved; however, her thought process and speech appeared more organized and compared to her presentation yesterday. Patient identified getting enough sleep as a healthy coping skill. Patient shared that she would like to talk more with her mother.  ? ? ? ?Therapeutic Modalities ?Cognitive Behavioral Therapy ?Motivational Interviewing ? ?Ileana Ladd Moreland, LCSWA ?10/19/2021  3:26 PM   ?

## 2021-10-19 NOTE — Progress Notes (Signed)
[facility] MD Progress Note ? ?10/19/2021 12:22 PM ?[name]  ?MRN:  [id number] ?Subjective:  Patient has been quite on shift. She has been medication compliant on shift and cooperative with treatment.  Patient denies SI, HI or AVH. Patient reports feeling much better today; she is calmer, more open and coherent. She is taking her meds and has been sleeping/eating well.  ?Principal Problem: Schizophrenia, undifferentiated (HCC) ?Diagnosis: Principal Problem: ?  Schizophrenia, undifferentiated (HCC) ? ?Total Time spent with patient: 20 minutes ? ?Past Psychiatric History: Schizophrenia ? ?Past Medical History:  ?Past Medical History:  ?Diagnosis Date  ? Anemia 10/02/2021  ? Non compliance w medication regimen   ? Schizophrenia (HCC)   ?  ?Past Surgical History:  ?Procedure Laterality Date  ? BIOPSY  09/25/2021  ? Procedure: BIOPSY;  Surgeon: Rachael Fee, MD;  Location: [facility];  Service: Gastroenterology;;  ? ESOPHAGOGASTRODUODENOSCOPY (EGD) WITH PROPOFOL [name] 09/25/2021  ? Procedure: ESOPHAGOGASTRODUODENOSCOPY (EGD) WITH PROPOFOL;  Surgeon: Rachael Fee, MD;  Location: [facility];  Service: Gastroenterology;  Laterality: [name];  With NGT placement  ? ?Family History: History reviewed. No pertinent family history. ?Family Psychiatric  History:  ?Social History:  ?Social History  ? ?Substance and Sexual Activity  ?Alcohol Use Not Currently  ? Comment: Refuses to disclose how much  ?   ?Social History  ? ?Substance and Sexual Activity  ?Drug Use No  ? Comment: Refuses to answer  ?  ?Social History  ? ?Socioeconomic History  ? Marital status: Single  ?  Spouse name: Not on file  ? Number of children: Not on file  ? Years of education: Not on file  ? Highest education level: Not on file  ?Occupational History  ? Not on file  ?Tobacco Use  ? Smoking status: Some Days  ?  Packs/day: 0.50  ?  Types: Cigarettes  ? Smokeless tobacco: Never  ?Vaping Use  ? Vaping Use: Every day  ?Substance and Sexual Activity  ? Alcohol use:  Not Currently  ?  Comment: Refuses to disclose how much  ? Drug use: No  ?  Comment: Refuses to answer  ? Sexual activity: Never  ?  Comment: refused to answer  ?Other Topics Concern  ? Not on file  ?Social History Narrative  ? Not on file  ? ?Social Determinants of Health  ? ?Financial Resource Strain: Not on file  ?Food Insecurity: Not on file  ?Transportation Needs: Not on file  ?Physical Activity: Not on file  ?Stress: Not on file  ?Social Connections: Not on file  ? ?Additional Social History:  ?  ?  ?  ?  ?  ?  ?  ?  ?  ?  ?  ? ?Sleep: Good ? ?Appetite:  Fair ? ?Current Medications: ?Current Facility-Administered Medications  ?Medication Dose Route Frequency Provider Last Rate Last Admin  ? (feeding supplement) PROSource Plus liquid 30 mL  30 mL Oral BID BM [name], [name], MD   30 mL at 10/19/21 1100  ? acetaminophen (TYLENOL) tablet 650 mg  650 mg Oral Q6H PRN [name], [name], MD      ? alum & mag hydroxide-simeth (MAALOX/MYLANTA) 200-200-20 MG/5ML suspension 30 mL  30 mL Oral Q4H PRN [name], [name], MD      ? benztropine (COGENTIN) tablet 0.5 mg  0.5 mg Oral BID PRN [name], [name], MD      ? clonazePAM (KLONOPIN) disintegrating tablet 0.25 mg  0.25 mg Oral BID [name], [name],  MD   0.25 mg at 10/19/21 0945  ? haloperidol (HALDOL) tablet 5 mg  5 mg Oral BID [name], [name], MD   5 mg at 10/19/21 5284  ? magnesium hydroxide (MILK OF MAGNESIA) suspension 30 mL  30 mL Oral Daily PRN [name], [name], MD      ? menthol-cetylpyridinium (CEPACOL) lozenge 3 mg  1 lozenge Oral PRN [name], [name], MD      ? midodrine (PROAMATINE) tablet 2.5 mg  2.5 mg Oral TID WC [name], [name], MD   2.5 mg at 10/19/21 1152  ? mirtazapine (REMERON SOL-TAB) disintegrating tablet 7.5 mg  7.5 mg Oral QHS [name], [name], MD   7.5 mg at 10/18/21 2223  ? multivitamin with minerals tablet 1 tablet  1 tablet Oral Daily [name], [name], MD   1 tablet at 10/19/21 0948  ? OLANZapine zydis (ZYPREXA) disintegrating tablet 5 mg  5 mg Oral QHS  [name], DO   5 mg at 10/18/21 2223  ? thiamine tablet 100 mg  100 mg Oral Daily [name], [name], MD   100 mg at 10/19/21 0946  ? traZODone (DESYREL) tablet 50 mg  50 mg Oral QHS PRN [name], [name], MD      ? ? ?Lab Results: No results found for this or any previous visit (from the past 48 hour(s)). ? ?Blood Alcohol level:  ?Lab Results  ?Component Value Date  ? ETH <10 09/12/2021  ? ETH <10 10/01/2020  ? ? ?Metabolic Disorder Labs: ?Lab Results  ?Component Value Date  ? HGBA1C 5.5 10/02/2021  ? MPG 111 10/02/2021  ? MPG 116.89 10/01/2020  ? ?Lab Results  ?Component Value Date  ? PROLACTIN 13.6 04/16/2015  ? ?Lab Results  ?Component Value Date  ? CHOL 171 10/01/2020  ? TRIG 68 10/01/2020  ? HDL 64 10/01/2020  ? CHOLHDL 2.7 10/01/2020  ? VLDL 14 10/01/2020  ? LDLCALC 93 10/01/2020  ? LDLCALC 118 (H) 04/16/2015  ? ? ?Physical Findings: ?AIMS:  , ,  ,  ,    ?CIWA:    ?COWS:    ? ?Musculoskeletal: ?Strength & Muscle Tone: within normal limits ?Gait & Station: normal ?Patient leans: [name] ? ?Psychiatric Specialty Exam: ? ?Presentation  ?General Appearance: Casual; Appropriate for Environment; Fairly Groomed ? ?Eye Contact:Fair ? ?Speech:Slow ? ?Speech Volume:Decreased ? ?Handedness:Right ? ? ?Mood and Affect  ?Mood:Anxious; Depressed ? ?Affect:Blunt ? ? ?Thought Process  ?Thought Processes:Disorganized ? ?Descriptions of Associations:Loose ? ?Orientation:Partial ? ?Thought Content:Delusions; Illogical; Paranoid Ideation ? ?History of Schizophrenia/Schizoaffective disorder:Yes ? ?Duration of Psychotic Symptoms:Greater than six months ? ?Hallucinations:Hallucinations: None ? ?Ideas of Reference:Paranoia ? ?Suicidal Thoughts:Suicidal Thoughts: No ? ?Homicidal Thoughts:Homicidal Thoughts: No ? ? ?Sensorium  ?Memory:Recent Poor; Remote Poor ? ?Judgment:Impaired ? ?Insight:Fair ? ? ?Executive Functions  ?Concentration:Poor ? ?Attention Span:Poor ? ?Recall:Poor ? ?Fund of  Knowledge:Poor ? ?Language:Fair ? ? ?Psychomotor Activity  ?Psychomotor Activity:Psychomotor Activity: Decreased; Psychomotor Retardation ? ? ?Assets  ?Assets:Communication Skills; Resilience; Financial Resources/Insurance; Desire for Improvement; Physical Health; Social Support ? ? ?Sleep  ?Sleep:Sleep: Fair ? ? ? ?Physical Exam: ?Physical Exam ?ROS ?Blood pressure (!) 91/58, pulse 64, temperature 97.9 ?F (36.6 ?C), resp. rate 18, height 5\' 4"  (1.626 m), weight 44.5 kg, SpO2 100 %. Body mass index is 16.82 kg/m?. ? ? ?Treatment Plan Summary: ?Daily contact with patient to assess and evaluate symptoms and progress in treatment, Medication management, and Plan : Continue current meds.  ? ?Mariusz Jubb ?10/19/2021, 12:22 PM ? ?

## 2021-10-19 NOTE — Progress Notes (Signed)
Patient is minimal but visible in Milieu. Compliant with medications no adverse drug noted. Encouraged to drink and eat this shift and compliant. Denies SI/HI/A/VH and verbally contracted for safety.  ? ?Support and encouragement provided. Will continue to monitor. ?

## 2021-10-19 NOTE — Plan of Care (Signed)
?  Problem: Education: ?Goal: Knowledge of [facility] Education information/materials will improve ?10/19/2021 0137 by Glynn Octave, RN ?Outcome: Progressing ?10/19/2021 0137 by Glynn Octave, RN ?Outcome: Progressing ?Goal: Mental status will improve ?10/19/2021 0137 by Glynn Octave, RN ?Outcome: Progressing ?10/19/2021 0137 by Glynn Octave, RN ?Outcome: Progressing ?  ?Problem: Activity: ?Goal: Interest or engagement in activities will improve ?10/19/2021 0137 by Glynn Octave, RN ?Outcome: Not Progressing ?10/19/2021 0137 by Glynn Octave, RN ?Outcome: Progressing ?  ?
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
## 2021-10-19 NOTE — [facility] Suicide Risk Assessment (Addendum)
[facility] INPATIENT:  Family/Significant Other Suicide Prevention Education ? ?Suicide Prevention Education:  ?Education Completed; Customer service manager (legal guardian/sister), [phone] has been identified by the patient as the family member/significant other with whom the patient will be residing, and identified as the person(s) who will aid the patient in the event of a mental health crisis (suicidal ideations/suicide attempt).  With written consent from the patient, the family member/significant other has been provided the following suicide prevention education, prior to the and/or following the discharge of the patient. ? ?The suicide prevention education provided includes the following: ?Suicide risk factors ?Suicide prevention and interventions ?National Suicide Hotline telephone number ?[facility] assessment telephone number ?[facility] Emergency Assistance 911 ?[place] and/or Residential Mobile Crisis Unit telephone number ? ?Request made of family/significant other to: ?Remove weapons (e.g., guns, rifles, knives), all items previously/currently identified as safety concern.   ?Remove drugs/medications (over-the-counter, prescriptions, illicit drugs), all items previously/currently identified as a safety concern. ? ?The family member/significant other verbalizes understanding of the suicide prevention education information provided.  The family member/significant other agrees to remove the items of safety concern listed above. ? ?Patient's guardian, [name] reports patient can return to live with patient's mother at discharge. Patient's guardian reports a concern that patient will become medication non-compliant post discharge due to patient history of refusal to leave her home/attend doctor's appointments. Guardian reports patient has been followed by ACT teams in the past but was unable to remember the names of the agencies patient received care through. Per guardian,  patient also received mental health services through the [place] approximately 3 years ago. ? ? ? ?[name] ?10/19/2021, 8:55 AM ?

## 2021-10-20 DIAGNOSIS — F203 Undifferentiated schizophrenia: Principal | ICD-10-CM

## 2021-10-20 NOTE — Group Note (Signed)
[facility] LCSW Group Therapy Note ? ? ? ?Group Date: 10/20/2021 ?Start Time: 1430 ?End Time: [id number] ? ?Type of Therapy and Topic:  Group Therapy:  Overcoming Obstacles ? ?Participation Level:  [facility] PARTICIPATION LEVEL: Active ? ?Mood: Blunted  ? ?Description of Group:   ?In this group patients will be encouraged to explore what they see as obstacles to their own wellness and recovery. They will be guided to discuss their thoughts, feelings, and behaviors related to these obstacles. The group will process together ways to cope with barriers, with attention given to specific choices patients can make. Each patient will be challenged to identify changes they are motivated to make in order to overcome their obstacles. This group will be process-oriented, with patients participating in exploration of their own experiences as well as giving and receiving support and challenge from other group members. ? ?Therapeutic Goals: ?1. Patient will identify personal and current obstacles as they relate to admission. ?2. Patient will identify barriers that currently interfere with their wellness or overcoming obstacles.  ?3. Patient will identify feelings, thought process and behaviors related to these barriers. ?4. Patient will identify two changes they are willing to make to overcome these obstacles:  ? ? ?Summary of Patient Progress ? ? ?Patient was present for the entirety of the group session. Patient was an active listener and participated in the topic of discussion, provided helpful advice to others, and added nuance to topic of conversation. Patient states her obstacles are "people claming to be family when they are not." Continues to express delusions and confusion regarding her family relationships. CSW redirected patient to work on introspection and challenge thoughts and emotions.  ? ? ?Therapeutic Modalities:   ?Cognitive Behavioral Therapy ?Solution Focused Therapy ?Motivational  Interviewing ?Relapse Prevention Therapy ? ? ?Durenda Hurt, LCSWA ?

## 2021-10-20 NOTE — Progress Notes (Signed)
[facility] MD Progress Note ? ?10/20/2021 11:38 AM ?[name]  ?MRN:  [id number] ?Subjective:  [name] is very pleasant and cooperative today. ?Principal Problem: Schizophrenia, undifferentiated ([place]) ?Diagnosis: Principal Problem: ?  Schizophrenia, undifferentiated ([place]) ? ? ?[id number] has improved.  She is taking her medications as prescribed and denies any side effects.  She states that she feels pretty good today. ? ?[name] is a 61 year old African-American female who was admitted to medicine and transferred to psychiatry on commitment for schizophrenia and failure to thrive.  Dr. Weber Cooks has been doing ECT with her because she was catatonic on admission.  She has been eating and drinking some since her treatments and more cooperative.  She is able to state her name.  She is alert.  She is a poor historian and a difficult interview.  According to the chart she has not been taking her medications at home and smoking a lot of cigarettes.  She denies SI. ?  ? ?Total Time spent with patient: 15 minutes ? ?Past Psychiatric History: Schizophrenia ? ?Past Medical History:  ?Past Medical History:  ?Diagnosis Date  ? Anemia 10/02/2021  ? Non compliance w medication regimen   ? Schizophrenia ([place])   ?  ?Past Surgical History:  ?Procedure Laterality Date  ? BIOPSY  09/25/2021  ? Procedure: BIOPSY;  Surgeon: Milus Banister, MD;  Location: [facility];  Service: Gastroenterology;;  ? ESOPHAGOGASTRODUODENOSCOPY (EGD) WITH PROPOFOL [name] 09/25/2021  ? Procedure: ESOPHAGOGASTRODUODENOSCOPY (EGD) WITH PROPOFOL;  Surgeon: Milus Banister, MD;  Location: [facility];  Service: Gastroenterology;  Laterality: [name];  With NGT placement  ? ?Family History: History reviewed. No pertinent family history. ? ?Social History:  ?Social History  ? ?Substance and Sexual Activity  ?Alcohol Use Not Currently  ? Comment: Refuses to disclose how much  ?   ?Social History  ? ?Substance and Sexual Activity  ?Drug Use No  ? Comment: Refuses to answer  ?  ?Social  History  ? ?Socioeconomic History  ? Marital status: Single  ?  Spouse name: Not on file  ? Number of children: Not on file  ? Years of education: Not on file  ? Highest education level: Not on file  ?Occupational History  ? Not on file  ?Tobacco Use  ? Smoking status: Some Days  ?  Packs/day: 0.50  ?  Types: Cigarettes  ? Smokeless tobacco: Never  ?Vaping Use  ? Vaping Use: Every day  ?Substance and Sexual Activity  ? Alcohol use: Not Currently  ?  Comment: Refuses to disclose how much  ? Drug use: No  ?  Comment: Refuses to answer  ? Sexual activity: Never  ?  Comment: refused to answer  ?Other Topics Concern  ? Not on file  ?Social History Narrative  ? Not on file  ? ?Social Determinants of Health  ? ?Financial Resource Strain: Not on file  ?Food Insecurity: Not on file  ?Transportation Needs: Not on file  ?Physical Activity: Not on file  ?Stress: Not on file  ?Social Connections: Not on file  ? ?Additional Social History:  ?  ?  ?  ?  ?  ?  ?  ?  ?  ?  ?  ? ?Sleep: Good ? ?Appetite:  Good ? ?Current Medications: ?Current Facility-Administered Medications  ?Medication Dose Route Frequency Provider Last Rate Last Admin  ? (feeding supplement) PROSource Plus liquid 30 mL  30 mL Oral BID BM [name], [name], MD   30 mL at 10/20/21 1014  ?  acetaminophen (TYLENOL) tablet 650 mg  650 mg Oral Q6H PRN [name], [name], MD      ? alum & mag hydroxide-simeth (MAALOX/MYLANTA) 200-200-20 MG/5ML suspension 30 mL  30 mL Oral Q4H PRN [name], [name], MD      ? benztropine (COGENTIN) tablet 0.5 mg  0.5 mg Oral BID PRN [name], [name], MD      ? clonazePAM Bobbye [place]) disintegrating tablet 0.25 mg  0.25 mg Oral BID [name], [name], MD   0.25 mg at 10/20/21 0900  ? haloperidol (HALDOL) tablet 5 mg  5 mg Oral BID [name], [name], MD   5 mg at 10/20/21 0900  ? magnesium hydroxide (MILK OF MAGNESIA) suspension 30 mL  30 mL Oral Daily PRN [name], [name], MD      ? menthol-cetylpyridinium (CEPACOL) lozenge 3 mg  1 lozenge Oral PRN  [name], [name], MD      ? midodrine (PROAMATINE) tablet 2.5 mg  2.5 mg Oral TID WC [name], [name], MD   2.5 mg at 10/20/21 1134  ? mirtazapine (REMERON SOL-TAB) disintegrating tablet 7.5 mg  7.5 mg Oral QHS [name], [name], MD   7.5 mg at 10/19/21 2225  ? multivitamin with minerals tablet 1 tablet  1 tablet Oral Daily [name], [name], MD   1 tablet at 10/20/21 0900  ? OLANZapine zydis (ZYPREXA) disintegrating tablet 5 mg  5 mg Oral QHS Parks Ranger, DO   5 mg at 10/19/21 2224  ? thiamine tablet 100 mg  100 mg Oral Daily [name], [name], MD   100 mg at 10/20/21 0900  ? traZODone (DESYREL) tablet 50 mg  50 mg Oral QHS PRN [name], [name], MD      ? ? ?Lab Results: No results found for this or any previous visit (from the past 48 hour(s)). ? ?Blood Alcohol level:  ?Lab Results  ?Component Value Date  ? ETH <10 09/12/2021  ? ETH <10 10/01/2020  ? ? ?Metabolic Disorder Labs: ?Lab Results  ?Component Value Date  ? HGBA1C 5.5 10/02/2021  ? MPG 111 10/02/2021  ? MPG 116.89 10/01/2020  ? ?Lab Results  ?Component Value Date  ? PROLACTIN 13.6 04/16/2015  ? ?Lab Results  ?Component Value Date  ? CHOL 171 10/01/2020  ? TRIG 68 10/01/2020  ? HDL 64 10/01/2020  ? CHOLHDL 2.7 10/01/2020  ? VLDL 14 10/01/2020  ? Jenkins 93 10/01/2020  ? LDLCALC 118 (H) 04/16/2015  ? ? ?Physical Findings: ?AIMS:  , ,  ,  ,    ?CIWA:    ?COWS:    ? ?Musculoskeletal: ?Strength & Muscle Tone: within normal limits ?Gait & Station: normal ?Patient leans: [name] ? ?Psychiatric Specialty Exam: ? ?Presentation  ?General Appearance: Casual; Appropriate for Environment; Fairly Groomed ? ?Eye Contact:Fair ? ?Speech:Slow ? ?Speech Volume:Decreased ? ?Handedness:Right ? ? ?Mood and Affect  ?Mood:Anxious; Depressed ? ?Affect:Blunt ? ? ?Thought Process  ?Thought Processes:Disorganized ? ?Descriptions of Associations:Loose ? ?Orientation:Partial ? ?Thought Content:Delusions; Illogical; Paranoid Ideation ? ?History of Schizophrenia/Schizoaffective  disorder:Yes ? ?Duration of Psychotic Symptoms:Greater than six months ? ?Hallucinations:No data recorded ?Ideas of Reference:Paranoia ? ?Suicidal Thoughts:No data recorded ?Homicidal Thoughts:No data recorded ? ?Sensorium  ?Memory:Recent Poor; Remote Poor ? ?Judgment:Impaired ? ?Insight:Fair ? ? ?Executive Functions  ?Concentration:Poor ? ?Attention Span:Poor ? ?Recall:Poor ? ?Fund of Knowledge:Poor ? ?Language:Fair ? ? ?Psychomotor Activity  ?Psychomotor Activity:No data recorded ? ?Assets  ?Assets:Communication Skills; Resilience; Financial Resources/Insurance; Desire for Improvement; Physical Health; Social Support ? ? ?  Sleep  ?Sleep:No data recorded ? ? ?Physical Exam: ?Physical Exam ?Vitals and nursing note reviewed.  ?Constitutional:   ?   Appearance: Normal appearance. She is normal weight.  ?Neurological:  ?   General: No focal deficit present.  ?   Mental Status: She is alert and oriented to person, place, and time.  ?Psychiatric:     ?   Mood and Affect: Mood normal.     ?   Behavior: Behavior normal.  ? ?Review of Systems  ?Constitutional: Negative.   ?HENT: Negative.    ?Eyes: Negative.   ?Respiratory: Negative.    ?Cardiovascular: Negative.   ?Gastrointestinal: Negative.   ?Genitourinary: Negative.   ?Musculoskeletal: Negative.   ?Skin: Negative.   ?Neurological: Negative.   ?Endo/Heme/Allergies: Negative.   ?Psychiatric/Behavioral: Negative.    ?Blood pressure (!) 92/54, pulse (!) 56, temperature 97.9 ?F (36.6 ?C), resp. rate 20, height 5\' 4"  (1.626 m), weight 44.5 kg, SpO2 99 %. Body mass index is 16.82 kg/m?. ? ? ?Treatment Plan Summary: ?Daily contact with patient to assess and evaluate symptoms and progress in treatment, Medication management, and Plan hemoglobin A1c and a lipid panel in the morning. ? ?Parks Ranger, DO ?10/20/2021, 11:38 AM ? ?

## 2021-10-20 NOTE — Progress Notes (Signed)
Pt lying in bed with eyes open; calm, cooperative. Pt states that she feels "alright". She currently denies pain, SI/HI/AVH, anxiety and depression. She reports that she sleeps "good" but has trouble going back to sleep if she awakes during the night. She describes her appetite as fine. No acute distress noted. ?

## 2021-10-20 NOTE — Progress Notes (Addendum)
Pt lying in bed with eyes closed; easily aroused when name called. Pt states that she feels "fine". She currently denies pain, SI/HI/AVH, anxiety and depression. She describes her sleep and appetite as "fine". Strong, musty smell noted on patient. No acute distress noted. ?

## 2021-10-20 NOTE — BH IP Treatment Plan (Unsigned)
Interdisciplinary Treatment and Diagnostic Plan Update  10/20/2021 Time of Session: 1445 [name] MRN: [id number]  Principal Diagnosis: Schizophrenia, undifferentiated (HCC)  Secondary Diagnoses: Principal Problem:   Schizophrenia, undifferentiated (HCC)   Current Medications:  Current Facility-Administered Medications  Medication Dose Route Frequency Provider Last Rate Last Admin   (feeding supplement) PROSource Plus liquid 30 mL  30 mL Oral BID BM [name], [name], MD   30 mL at 10/20/21 1355   acetaminophen (TYLENOL) tablet 650 mg  650 mg Oral Q6H PRN [name], [name], MD       alum & mag hydroxide-simeth (MAALOX/MYLANTA) 200-200-20 MG/5ML suspension 30 mL  30 mL Oral Q4H PRN [name], [name], MD       benztropine (COGENTIN) tablet 0.5 mg  0.5 mg Oral BID PRN [name], [name], MD       clonazePAM (KLONOPIN) disintegrating tablet 0.25 mg  0.25 mg Oral BID [name], [name], MD   0.25 mg at 10/20/21 0900   haloperidol (HALDOL) tablet 5 mg  5 mg Oral BID [name], [name], MD   5 mg at 10/20/21 0900   magnesium hydroxide (MILK OF MAGNESIA) suspension 30 mL  30 mL Oral Daily PRN [name], [name], MD       menthol-cetylpyridinium (CEPACOL) lozenge 3 mg  1 lozenge Oral PRN [name], [name], MD       midodrine (PROAMATINE) tablet 2.5 mg  2.5 mg Oral TID WC [name], [name], MD   2.5 mg at 10/20/21 1134   mirtazapine (REMERON SOL-TAB) disintegrating tablet 7.5 mg  7.5 mg Oral QHS [name], [name], MD   7.5 mg at 10/19/21 2225   multivitamin with minerals tablet 1 tablet  1 tablet Oral Daily [name], [name], MD   1 tablet at 10/20/21 0900   OLANZapine zydis (ZYPREXA) disintegrating tablet 5 mg  5 mg Oral QHS [name], DO   5 mg at 10/19/21 2224   thiamine tablet 100 mg  100 mg Oral Daily [name], [name], MD   100 mg at 10/20/21 0900   traZODone (DESYREL) tablet 50 mg  50 mg Oral QHS PRN [name], [name], MD       PTA Medications: Medications Prior to Admission  Medication Sig Dispense  Refill Last Dose   benztropine (COGENTIN) 0.5 MG tablet Take 1 tablet (0.5 mg total) by mouth 2 (two) times daily as needed for tremors.      clonazePAM (KLONOPIN) 0.5 MG tablet Place 1 tablet (0.5 mg total) into feeding tube 2 (two) times daily. 30 tablet 0    haloperidol (HALDOL) 5 MG tablet Take 1 tablet (5 mg total) by mouth 2 (two) times daily.      midodrine (PROAMATINE) 2.5 MG tablet Take 1 tablet (2.5 mg total) by mouth 3 (three) times daily with meals.  0    mirtazapine (REMERON SOL-TAB) 15 MG disintegrating tablet Take 0.5 tablets (7.5 mg total) by mouth at bedtime. 30 tablet 0    Multiple Vitamin (MULTIVITAMIN WITH MINERALS) TABS tablet Take 1 tablet by mouth daily.      thiamine 100 MG tablet Take 1 tablet (100 mg total) by mouth daily.      traZODone (DESYREL) 50 MG tablet Take 1 tablet (50 mg total) by mouth at bedtime as needed for sleep. 30 tablet 0     Patient Stressors: Health problems    Patient Strengths: Other: Unable to determined at this time  Treatment Modalities: Medication Management, Group therapy, Case management,  1 to 1 session with clinician, Psychoeducation, Recreational therapy.   Physician Treatment Plan for Primary Diagnosis: Schizophrenia, undifferentiated (HCC) Long Term Goal(s): Improvement in symptoms so as ready for discharge   Short Term Goals: Ability to disclose and discuss suicidal ideas Ability to identify and develop effective coping behaviors will improve Ability to verbalize feelings will improve Ability to maintain clinical measurements within normal limits will improve Compliance with prescribed medications will improve  Medication Management: Evaluate patient's response, side effects, and tolerance of medication regimen.  Therapeutic Interventions: 1 to 1 sessions, Unit Group sessions and Medication administration.  Evaluation of Outcomes: Not Met  Physician Treatment Plan for Secondary Diagnosis: Principal Problem:    Schizophrenia, undifferentiated (HCC)  Long Term Goal(s): Improvement in symptoms so as ready for discharge   Short Term Goals: Ability to disclose and discuss suicidal ideas Ability to identify and develop effective coping behaviors will improve Ability to verbalize feelings will improve Ability to maintain clinical measurements within normal limits will improve Compliance with prescribed medications will improve     Medication Management: Evaluate patient's response, side effects, and tolerance of medication regimen.  Therapeutic Interventions: 1 to 1 sessions, Unit Group sessions and Medication administration.  Evaluation of Outcomes: Not Met   RN Treatment Plan for Primary Diagnosis: Schizophrenia, undifferentiated (HCC) Long Term Goal(s): Knowledge of disease and therapeutic regimen to maintain health will improve  Short Term Goals: Ability to remain free from injury will improve, Ability to verbalize frustration and anger appropriately will improve, Ability to demonstrate self-control, Ability to participate in decision making will improve, Ability to verbalize feelings will improve, Ability to disclose and discuss suicidal ideas, Ability to identify and develop effective coping behaviors will improve, and Compliance with prescribed medications will improve  Medication Management: RN will administer medications as ordered by provider, will assess and evaluate patient's response and provide education to patient for prescribed medication. RN will report any adverse and/or side effects to prescribing provider.  Therapeutic Interventions: 1 on 1 counseling sessions, Psychoeducation, Medication administration, Evaluate responses to treatment, Monitor vital signs and CBGs as ordered, Perform/monitor CIWA, COWS, AIMS and Fall Risk screenings as ordered, Perform wound care treatments as ordered.  Evaluation of Outcomes: Not Met   LCSW Treatment Plan for Primary Diagnosis: Schizophrenia,  undifferentiated (HCC) Long Term Goal(s): Safe transition to appropriate next level of care at discharge, Engage patient in therapeutic group addressing interpersonal concerns.  Short Term Goals: Engage patient in aftercare planning with referrals and resources, Increase social support, Increase ability to appropriately verbalize feelings, Increase emotional regulation, Facilitate acceptance of mental health diagnosis and concerns, Facilitate patient progression through stages of change regarding substance use diagnoses and concerns, Identify triggers associated with mental health/substance abuse issues, and Increase skills for wellness and recovery  Therapeutic Interventions: Assess for all discharge needs, 1 to 1 time with Social worker, Explore available resources and support systems, Assess for adequacy in community support network, Educate family and significant other(s) on suicide prevention, Complete Psychosocial Assessment, Interpersonal group therapy.  Evaluation of Outcomes: Not Met   Progress in Treatment: Attending groups: Yes. Participating in groups: Yes. Taking medication as prescribed: Yes. Toleration medication: Yes. Family/Significant other contact made: Yes, individual(s) contacted:  SPE completed with [name] Thackston-Lennon, legal guardian/sister. Patient understands diagnosis: No. Discussing patient identified problems/goals with staff: Yes. Medical problems stabilized or resolved: Yes. Denies suicidal/homicidal ideation: Yes. Issues/concerns per patient self-inventory: Yes. Other: none  New problem(s) identified: Yes, Describe:  Patient continues to express delusions; states she  lives with her great-grandmother who is age 59. Unable to determine discharge plan at this time. CSW team will have further conversations with patient and legal guardian to plan for discharge.    New Short Term/Long Term Goal(s): Patient to work towards elimination of symptoms of psychosis,  medication management for mood stabilization; development of comprehensive mental wellness plan.  Patient Goals: Patient simply states, "want to go home."   Discharge Plan or Barriers: No psychosocial barriers identified at this time, assuming patient will return to place of residence when appropriate for discharge. CSW will confirm with legal guardian.   Reason for Continuation of Hospitalization: Delusions   Estimated Length of Stay: TBD   Scribe for Treatment Team: Almedia Balls 10/20/2021 3:04 PM

## 2021-10-20 NOTE — Progress Notes (Signed)
Recreation Therapy Notes ? ?Date: 10/20/2021 ? ?Time: 1:35pm    ? ?Location: Courtyard   ? ?Behavioral response: [name] ?  ?Intervention Topic: Emotions   ? ?Discussion/Intervention: ?Patient refused to attend group.  ? ?Clinical Observations/Feedback:  ?Patient refused to attend group.  ?  ?Seneca Gadbois LRT/CTRS ? ? ? ? ? ? ? ? ?Rasheka Denard ?10/20/2021 3:21 PM ?

## 2021-10-20 NOTE — Progress Notes (Signed)
Patient is calm and cooperative with assessment. She denies SI, HI, and AVH. Patient also denies depression and anxiety. She denies pain or any other physical problem. Patient ate her breakfast and took her medications. Patient is sitting in the dayroom watching TV. Patient remains safe on the unit at this time. ?

## 2021-10-21 LAB — LIPID PANEL
Cholesterol: 189 mg/dL (ref 0–200)
HDL: 50 mg/dL (ref >40)
LDL Cholesterol: 129 mg/dL — ABNORMAL HIGH (ref 0–99)
Total CHOL/HDL Ratio: 3.8 RATIO
Triglycerides: 52 mg/dL (ref <150)
VLDL: 10 mg/dL (ref 0–40)

## 2021-10-21 NOTE — [facility] Group Notes (Signed)
[facility] Group Notes:  (Nursing/MHT/Case Management/Adjunct) ? ?Date:  10/21/2021  ?Time:  3:27 PM ? ?Type of Therapy:  Psychoeducational Skills ? ?Participation Level:  Active ? ?Participation Quality:  Appropriate and Attentive ? ?Affect:  Appropriate ? ?Cognitive:  Appropriate ? ?Insight:  Appropriate ? ?Engagement in Group:  Engaged ? ?Modes of Intervention:  Discussion ? ?Summary of Progress/Problems: ?The pt attended group and was actively engaged. The pt shared when asked to participate in the discussion. ?Barbaraann Rondo ?10/21/2021, 3:27 PM ?

## 2021-10-21 NOTE — Progress Notes (Signed)
Patient is calm and cooperative during assessment. Denies SI, HI, AVH. Denies anxiety and depression. Denies pain or discomfort at this time. Ate meals in the day room among peers and tolerated well. Compliant with due medications. Remain safe on the unit with Q15 minute safety check in place. ?

## 2021-10-21 NOTE — Progress Notes (Signed)
[facility] MD Progress Note ? ?10/21/2021 11:30 AM ?[name]  ?MRN:  [id number] ?Subjective: [name] tells me that she is doing well today.  She seems to be back to baseline because she presented catatonic and needed ECT.  Since that time she has been taking her medications and denies any [name] effects.  Her sleep and appetite and mood have improved.  She is doing well on Zyprexa, Haldol, and Klonopin.  There is no evidence of EPS or TD.  She goes to the [place] in [place] and currently lives alone.  She does have a guardian, which is her sister. ? ?Principal Problem: Schizophrenia, undifferentiated (HCC) ?Diagnosis: Principal Problem: ?  Schizophrenia, undifferentiated (HCC) ? ?Total Time spent with patient: 15 minutes ? ?Past Psychiatric History: She was hospitalized at [facility] in March 2022 and September 2021.  Patient has had multiple psychiatric hospitalizations in the past.  She was at [facility] hospital in 2020 secondary to similar circumstances.  She had been hospitalized apparently at an outside facility [facility]) for 4 weeks prior to being transferred to [facility].  Outpatient at the [facility]. ? ?Past Medical History:  ?Past Medical History:  ?Diagnosis Date  ? Anemia 10/02/2021  ? Non compliance w medication regimen   ? Schizophrenia (HCC)   ?  ?Past Surgical History:  ?Procedure Laterality Date  ? BIOPSY  09/25/2021  ? Procedure: BIOPSY;  Surgeon: Paula Fee, MD;  Location: [facility];  Service: Gastroenterology;;  ? ESOPHAGOGASTRODUODENOSCOPY (EGD) WITH PROPOFOL [name] 09/25/2021  ? Procedure: ESOPHAGOGASTRODUODENOSCOPY (EGD) WITH PROPOFOL;  Surgeon: Paula Fee, MD;  Location: [facility];  Service: Gastroenterology;  Laterality: [name];  With NGT placement  ? ?Family History: History reviewed. No pertinent family history. ? ?Social History:  ?Social History  ? ?Substance and Sexual Activity  ?Alcohol Use Not Currently  ? Comment: Refuses to  disclose how much  ?   ?Social History  ? ?Substance and Sexual Activity  ?Drug Use No  ? Comment: Refuses to answer  ?  ?Social History  ? ?Socioeconomic History  ? Marital status: Single  ?  Spouse name: Not on file  ? Number of children: Not on file  ? Years of education: Not on file  ? Highest education level: Not on file  ?Occupational History  ? Not on file  ?Tobacco Use  ? Smoking status: Some Days  ?  Packs/day: 0.50  ?  Types: Cigarettes  ? Smokeless tobacco: Never  ?Vaping Use  ? Vaping Use: Every day  ?Substance and Sexual Activity  ? Alcohol use: Not Currently  ?  Comment: Refuses to disclose how much  ? Drug use: No  ?  Comment: Refuses to answer  ? Sexual activity: Never  ?  Comment: refused to answer  ?Other Topics Concern  ? Not on file  ?Social History Narrative  ? Not on file  ? ?Social Determinants of Health  ? ?Financial Resource Strain: Not on file  ?Food Insecurity: Not on file  ?Transportation Needs: Not on file  ?Physical Activity: Not on file  ?Stress: Not on file  ?Social Connections: Not on file  ? ?Additional Social History:  ?  ?  ?  ?  ?  ?  ?  ?  ?  ?  ?  ? ?Sleep: Good ? ?Appetite:  Good ? ?Current Medications: ?Current Facility-Administered Medications  ?Medication Dose Route Frequency Provider Last Rate Last Admin  ? (feeding supplement) PROSource Plus liquid 30  mL  30 mL Oral BID BM [name], Paula T, MD   30 mL at 10/21/21 0930  ? acetaminophen (TYLENOL) tablet 650 mg  650 mg Oral Q6H PRN [name], Paula Denmark, MD      ? alum & mag hydroxide-simeth (MAALOX/MYLANTA) 200-200-20 MG/5ML suspension 30 mL  30 mL Oral Q4H PRN [name], Paula T, MD      ? benztropine (COGENTIN) tablet 0.5 mg  0.5 mg Oral BID PRN [name], Paula Denmark, MD      ? clonazePAM Scarlette Calico) disintegrating tablet 0.25 mg  0.25 mg Oral BID [name], Paula T, MD   0.25 mg at 10/21/21 6962  ? haloperidol (HALDOL) tablet 5 mg  5 mg Oral BID [name], Paula T, MD   5 mg at 10/21/21 9528  ? magnesium hydroxide (MILK OF MAGNESIA) suspension  30 mL  30 mL Oral Daily PRN [name], Paula Denmark, MD      ? menthol-cetylpyridinium (CEPACOL) lozenge 3 mg  1 lozenge Oral PRN [name], Paula Denmark, MD      ? midodrine (PROAMATINE) tablet 2.5 mg  2.5 mg Oral TID WC [name], Paula T, MD   2.5 mg at 10/21/21 0830  ? mirtazapine (REMERON SOL-TAB) disintegrating tablet 7.5 mg  7.5 mg Oral QHS [name], Paula T, MD   7.5 mg at 10/20/21 2213  ? multivitamin with minerals tablet 1 tablet  1 tablet Oral Daily [name], Paula Denmark, MD   1 tablet at 10/21/21 4132  ? OLANZapine zydis (ZYPREXA) disintegrating tablet 5 mg  5 mg Oral QHS Paula Ill, DO   5 mg at 10/20/21 2214  ? thiamine tablet 100 mg  100 mg Oral Daily [name], Paula Denmark, MD   100 mg at 10/21/21 4401  ? traZODone (DESYREL) tablet 50 mg  50 mg Oral QHS PRN [name], Paula Denmark, MD      ? ? ?Lab Results:  ?Results for orders placed or performed during the hospital encounter of 10/17/21 (from the past 48 hour(s))  ?Lipid panel     Status: Abnormal  ? Collection Time: 10/21/21  6:29 AM  ?Result Value Ref Range  ? Cholesterol 189 0 - 200 mg/dL  ? Triglycerides 52 <150 mg/dL  ? HDL 50 >40 mg/dL  ? Total CHOL/HDL Ratio 3.8 RATIO  ? VLDL 10 0 - 40 mg/dL  ? LDL Cholesterol 129 (H) 0 - 99 mg/dL  ?  Comment:        ?Total Cholesterol/HDL:CHD Risk ?Coronary Heart Disease Risk Table ?                    Men   Women ? 1/2 Average Risk   3.4   3.3 ? Average Risk       5.0   4.4 ? 2 X Average Risk   9.6   7.1 ? 3 X Average Risk  23.4   11.0 ?       ?Use the calculated Patient Ratio ?above and the CHD Risk Table ?to determine the patient's CHD Risk. ?       ?ATP III CLASSIFICATION (LDL): ? <100     mg/dL   Optimal ? 027-253  mg/dL   Near or Above ?                   Optimal ? 130-159  mg/dL   Borderline ? 160-189  mg/dL   High ? >664     mg/dL   Very High ?Performed at [facility],  [address] [place], [place] [address] ?  ? ? ?Blood Alcohol level:  ?Lab Results  ?Component Value Date  ? ETH <10 09/12/2021  ? ETH <10  10/01/2020  ? ? ?Metabolic Disorder Labs: ?Lab Results  ?Component Value Date  ? HGBA1C 5.5 10/02/2021  ? MPG 111 10/02/2021  ? MPG 116.89 10/01/2020  ? ?Lab Results  ?Component Value Date  ? PROLACTIN 13.6 04/16/2015  ? ?Lab Results  ?Component Value Date  ? CHOL 189 10/21/2021  ? TRIG 52 10/21/2021  ? HDL 50 10/21/2021  ? CHOLHDL 3.8 10/21/2021  ? VLDL 10 10/21/2021  ? LDLCALC 129 (H) 10/21/2021  ? LDLCALC 93 10/01/2020  ? ? ?Physical Findings: ?AIMS:  , ,  ,  ,    ?CIWA:    ?COWS:    ? ?Musculoskeletal: ?Strength & Muscle Tone: within normal limits ?Gait & Station: normal ?Patient leans: [name] ? ?Psychiatric Specialty Exam: ? ?Presentation  ?General Appearance: Casual; Appropriate for Environment; Fairly Groomed ? ?Eye Contact:Fair ? ?Speech:Slow ? ?Speech Volume:Decreased ? ?Handedness:Right ? ? ?Mood and Affect  ?Mood:Anxious; Depressed ? ?Affect:Blunt ? ? ?Thought Process  ?Thought Processes:Disorganized ? ?Descriptions of Associations:Loose ? ?Orientation:Partial ? ?Thought Content:Delusions; Illogical; Paranoid Ideation ? ?History of Schizophrenia/Schizoaffective disorder:Yes ? ?Duration of Psychotic Symptoms:Greater than six months ? ?Hallucinations:No data recorded ?Ideas of Reference:Paranoia ? ?Suicidal Thoughts:No data recorded ?Homicidal Thoughts:No data recorded ? ?Sensorium  ?Memory:Recent Poor; Remote Poor ? ?Judgment:Impaired ? ?Insight:Fair ? ? ?Executive Functions  ?Concentration:Poor ? ?Attention Span:Poor ? ?Recall:Poor ? ?Fund of Knowledge:Poor ? ?Language:Fair ? ? ?Psychomotor Activity  ?Psychomotor Activity:No data recorded ? ?Assets  ?Assets:Communication Skills; Resilience; Financial Resources/Insurance; Desire for Improvement; Physical Health; Social Support ? ? ?Sleep  ?Sleep:No data recorded ? ? ?Physical Exam: ?Physical Exam ?Vitals and nursing note reviewed.  ?Constitutional:   ?   Appearance: Normal appearance. She is normal weight.  ?Neurological:  ?   General: No focal deficit  present.  ?   Mental Status: She is alert and oriented to person, place, and time.  ?Psychiatric:     ?   Attention and Perception: Attention and perception normal.     ?   Mood and Affect: Mood and affect normal.

## 2021-10-21 NOTE — Progress Notes (Signed)
Recreation Therapy Notes ? ?Date: 10/21/2021 ?  ?Time: 1:05 pm    ?  ?Location: Court yard  ?  ?Behavioral response: Appropriate ?  ?Intervention Topic:  Leisure   ?  ?Discussion/Intervention:  ?Group content today was focused on leisure. The group defined what leisure is and some positive leisure activities they participate in. Individuals identified the difference between good and bad leisure. Participants expressed how they feel after participating in the leisure of their choice. The group discussed how they go about picking a leisure activity and if others are involved in their leisure activities. The patient stated how many leisure activities they have to choose from and reasons why it is important to have leisure time. Individuals participated in the intervention ?Exploration of Leisure? where they had a chance to identify new leisure activities as well as benefits of leisure. ?Clinical Observations/Feedback: ?Patient came to group late and was focused on what peers and staff had to say about leisure. Participant participated in open leisure while being appropriate with staff and peers. Individual was social with peers and staff while participating in the intervention.    ?Micheala Morissette LRT/CTRS  ? ? ? ? ? ? ? ?Kairyn Olmeda ?10/21/2021 2:04 PM ?

## 2021-10-22 ENCOUNTER — Encounter: Payer: Self-pay | Admitting: Psychiatry

## 2021-10-22 LAB — HEMOGLOBIN A1C
Hgb A1c MFr Bld: 5.6 % (ref 4.8–5.6)
Mean Plasma Glucose: 114 mg/dL

## 2021-10-22 NOTE — Progress Notes (Signed)
[facility] MD Progress Note ? ?10/22/2021 2:03 PM ?[name]  ?MRN:  [id number] ?Subjective: [name] has been seen on rounds.  She says that she is doing well.  She has been compliant with her medication.  She denies any side effects and there is no evidence of EPS or TD.  She is sleeping well and eating well. ? ?Principal Problem: Schizophrenia, undifferentiated (HCC) ?Diagnosis: Principal Problem: ?  Schizophrenia, undifferentiated (HCC) ? ?Total Time spent with patient: 15 minutes ? ?Past Psychiatric History: She was hospitalized at [facility] in March 2022 and September 2021.  Patient has had multiple psychiatric hospitalizations in the past.  She was at [facility] hospital in 2020 secondary to similar circumstances.  She had been hospitalized apparently at an outside facility [facility]) for 4 weeks prior to being transferred to [facility].  Outpatient at the [facility]. ? ?Past Medical History:  ?Past Medical History:  ?Diagnosis Date  ? Anemia 10/02/2021  ? Non compliance w medication regimen   ? Schizophrenia (HCC)   ?  ?Past Surgical History:  ?Procedure Laterality Date  ? BIOPSY  09/25/2021  ? Procedure: BIOPSY;  Surgeon: Rachael Fee, MD;  Location: [facility];  Service: Gastroenterology;;  ? ESOPHAGOGASTRODUODENOSCOPY (EGD) WITH PROPOFOL [name] 09/25/2021  ? Procedure: ESOPHAGOGASTRODUODENOSCOPY (EGD) WITH PROPOFOL;  Surgeon: Rachael Fee, MD;  Location: [facility];  Service: Gastroenterology;  Laterality: [name];  With NGT placement  ? ?Family History: History reviewed. No pertinent family history. ? ?Social History:  ?Social History  ? ?Substance and Sexual Activity  ?Alcohol Use Not Currently  ? Comment: Refuses to disclose how much  ?   ?Social History  ? ?Substance and Sexual Activity  ?Drug Use No  ? Comment: Refuses to answer  ?  ?Social History  ? ?Socioeconomic History  ? Marital status: Single  ?  Spouse name: Not on file  ? Number of children: Not  on file  ? Years of education: Not on file  ? Highest education level: Not on file  ?Occupational History  ? Not on file  ?Tobacco Use  ? Smoking status: Some Days  ?  Packs/day: 0.50  ?  Types: Cigarettes  ? Smokeless tobacco: Never  ?Vaping Use  ? Vaping Use: Every day  ?Substance and Sexual Activity  ? Alcohol use: Not Currently  ?  Comment: Refuses to disclose how much  ? Drug use: No  ?  Comment: Refuses to answer  ? Sexual activity: Never  ?  Comment: refused to answer  ?Other Topics Concern  ? Not on file  ?Social History Narrative  ? Not on file  ? ?Social Determinants of Health  ? ?Financial Resource Strain: Not on file  ?Food Insecurity: Not on file  ?Transportation Needs: Not on file  ?Physical Activity: Not on file  ?Stress: Not on file  ?Social Connections: Not on file  ? ?Additional Social History:  ?  ?  ?  ?  ?  ?  ?  ?  ?  ?  ?  ? ?Sleep: Good ? ?Appetite:  Good ? ?Current Medications: ?Current Facility-Administered Medications  ?Medication Dose Route Frequency Provider Last Rate Last Admin  ? (feeding supplement) PROSource Plus liquid 30 mL  30 mL Oral BID BM [name], [name], MD   30 mL at 10/22/21 0913  ? acetaminophen (TYLENOL) tablet 650 mg  650 mg Oral Q6H PRN [name], [name], MD      ? alum & mag  hydroxide-simeth (MAALOX/MYLANTA) 200-200-20 MG/5ML suspension 30 mL  30 mL Oral Q4H PRN [name], [name], MD      ? benztropine (COGENTIN) tablet 0.5 mg  0.5 mg Oral BID PRN [name], Jackquline DenmarkJohn T, MD      ? clonazePAM Scarlette Calico(KLONOPIN) disintegrating tablet 0.25 mg  0.25 mg Oral BID [name], [name], MD   0.25 mg at 10/22/21 [id number]  ? haloperidol (HALDOL) tablet 5 mg  5 mg Oral BID [name], [name], MD   5 mg at 10/22/21 0907  ? magnesium hydroxide (MILK OF MAGNESIA) suspension 30 mL  30 mL Oral Daily PRN [name], Jackquline DenmarkJohn T, MD      ? menthol-cetylpyridinium (CEPACOL) lozenge 3 mg  1 lozenge Oral PRN [name], Jackquline DenmarkJohn T, MD      ? midodrine (PROAMATINE) tablet 2.5 mg  2.5 mg Oral TID WC [name], [name], MD   2.5 mg at  10/22/21 1156  ? mirtazapine (REMERON SOL-TAB) disintegrating tablet 7.5 mg  7.5 mg Oral QHS [name], [name], MD   7.5 mg at 10/21/21 2225  ? multivitamin with minerals tablet 1 tablet  1 tablet Oral Daily [name], Jackquline DenmarkJohn T, MD   1 tablet at 10/22/21 [id number]  ? OLANZapine zydis (ZYPREXA) disintegrating tablet 5 mg  5 mg Oral QHS Sarina IllHerrick, Miriana Gaertner Edward, DO   5 mg at 10/21/21 2227  ? thiamine tablet 100 mg  100 mg Oral Daily [name], Jackquline DenmarkJohn T, MD   100 mg at 10/22/21 [id number]  ? traZODone (DESYREL) tablet 50 mg  50 mg Oral QHS PRN [name], Jackquline DenmarkJohn T, MD      ? ? ?Lab Results:  ?Results for orders placed or performed during the hospital encounter of 10/17/21 (from the past 48 hour(s))  ?Hemoglobin A1c     Status: None  ? Collection Time: 10/21/21  6:29 AM  ?Result Value Ref Range  ? Hgb A1c MFr Bld 5.6 4.8 - 5.6 %  ?  Comment: (NOTE) ?        Prediabetes: 5.7 - 6.4 ?        Diabetes: >6.4 ?        Glycemic control for adults with diabetes: <7.0 ?  ? Mean Plasma Glucose 114 mg/dL  ?  Comment: (NOTE) ?Performed At: [facility] ?[address] [place], [place] [id number] ?Jolene SchimkeNagendra Sanjai MD NF:[id number]:[phone] ?  ?Lipid panel     Status: Abnormal  ? Collection Time: 10/21/21  6:29 AM  ?Result Value Ref Range  ? Cholesterol 189 0 - 200 mg/dL  ? Triglycerides 52 <150 mg/dL  ? HDL 50 >40 mg/dL  ? Total CHOL/HDL Ratio 3.8 RATIO  ? VLDL 10 0 - 40 mg/dL  ? LDL Cholesterol 129 (H) 0 - 99 mg/dL  ?  Comment:        ?Total Cholesterol/HDL:CHD Risk ?Coronary Heart Disease Risk Table ?                    Men   Women ? 1/2 Average Risk   3.4   3.3 ? Average Risk       5.0   4.4 ? 2 X Average Risk   9.6   7.1 ? 3 X Average Risk  23.4   11.0 ?       ?Use the calculated Patient Ratio ?above and the CHD Risk Table ?to determine the patient's CHD Risk. ?       ?ATP III CLASSIFICATION (LDL): ? <100     mg/dL  Optimal ? 100-129  mg/dL   Near or Above ?                   Optimal ? 130-159  mg/dL   Borderline ? 160-189  mg/dL   High ? >106     mg/dL    Very High ?Performed at [facility], [address], [place], [place] [address] ?  ? ? ?Blood Alcohol level:  ?Lab Results  ?Component Value Date  ? ETH <10 09/12/2021  ? ETH <10 10/01/2020  ? ? ?Metabolic Disorder Labs: ?Lab Results  ?Component Value Date  ? HGBA1C 5.6 10/21/2021  ? MPG 114 10/21/2021  ? MPG 111 10/02/2021  ? ?Lab Results  ?Component Value Date  ? PROLACTIN 13.6 04/16/2015  ? ?Lab Results  ?Component Value Date  ? CHOL 189 10/21/2021  ? TRIG 52 10/21/2021  ? HDL 50 10/21/2021  ? CHOLHDL 3.8 10/21/2021  ? VLDL 10 10/21/2021  ? LDLCALC 129 (H) 10/21/2021  ? LDLCALC 93 10/01/2020  ? ? ?Physical Findings: ?AIMS:  , ,  ,  ,    ?CIWA:    ?COWS:    ? ?Musculoskeletal: ?Strength & Muscle Tone: within normal limits ?Gait & Station: normal ?Patient leans: [name] ? ?Psychiatric Specialty Exam: ? ?Presentation  ?General Appearance: Casual; Appropriate for Environment; Fairly Groomed ? ?Eye Contact:Fair ? ?Speech:Slow ? ?Speech Volume:Decreased ? ?Handedness:Right ? ? ?Mood and Affect  ?Mood:Anxious; Depressed ? ?Affect:Blunt ? ? ?Thought Process  ?Thought Processes:Disorganized ? ?Descriptions of Associations:Loose ? ?Orientation:Partial ? ?Thought Content:Delusions; Illogical; Paranoid Ideation ? ?History of Schizophrenia/Schizoaffective disorder:Yes ? ?Duration of Psychotic Symptoms:Greater than six months ? ?Hallucinations:No data recorded ?Ideas of Reference:Paranoia ? ?Suicidal Thoughts:No data recorded ?Homicidal Thoughts:No data recorded ? ?Sensorium  ?Memory:Recent Poor; Remote Poor ? ?Judgment:Impaired ? ?Insight:Fair ? ? ?Executive Functions  ?Concentration:Poor ? ?Attention Span:Poor ? ?Recall:Poor ? ?Fund of Knowledge:Poor ? ?Language:Fair ? ? ?Psychomotor Activity  ?Psychomotor Activity:No data recorded ? ?Assets  ?Assets:Communication Skills; Resilience; Financial Resources/Insurance; Desire for Improvement; Physical Health; Social Support ? ? ?Sleep  ?Sleep:No data recorded ? ? ?Physical  Exam: ?Physical Exam ?Vitals and nursing note reviewed.  ?Constitutional:   ?   Appearance: Normal appearance. She is normal weight.  ?Neurological:  ?   General: No focal deficit present.  ?   Mental Status

## 2021-10-22 NOTE — Plan of Care (Signed)
Patient is alert and oriented times 2. Mood and affect appropriate. Patient denies pain. She denies SI, HI, and AVH. Also denies feelings of anxiety and depression at this time. States she slept good last night. Morning meds given whole by mouth W/O difficulty. Ate breakfast in day room- appetite good. Patient remains on unit with Q15 minute checks in place.  ? ? ? ?Problem: Education: ?Goal: Knowledge of [facility] Education information/materials will improve ?Outcome: Progressing ?Goal: Mental status will improve ?Outcome: Progressing ?  ?Problem: Activity: ?Goal: Interest or engagement in activities will improve ?Outcome: Progressing ?  ?

## 2021-10-22 NOTE — Progress Notes (Signed)
Pt lying in bed with eyes closed; easily aroused when name called. Pt states that she feels "fine; just tired". She denies pain, SI/HI/AVH, anxiety and depression at this time. She describes her sleep as "excellent" and her appetite as "fine". No acute distress noted. ?

## 2021-10-23 NOTE — Group Note (Signed)
[facility] LCSW Group Therapy Note ? ? ?Group Date: 10/23/2021 ?Start Time: 1415 ?End Time: [id number] ? ? ?Type of Therapy/Topic:  Group Therapy:  Emotion Regulation ? ?Participation Level:  Minimal  ? ?Mood: ? ?Description of Group:   ? The purpose of this group is to assist patients in learning to regulate negative emotions and experience positive emotions. Patients will be guided to discuss ways in which they have been vulnerable to their negative emotions. These vulnerabilities will be juxtaposed with experiences of positive emotions or situations, and patients challenged to use positive emotions to combat negative ones. Special emphasis will be placed on coping with negative emotions in conflict situations, and patients will process healthy conflict resolution skills. ? ?Therapeutic Goals: ?Patient will identify two positive emotions or experiences to reflect on in order to balance out negative emotions:  ?Patient will label two or more emotions that they find the most difficult to experience:  ?Patient will be able to demonstrate positive conflict resolution skills through discussion or role plays:  ? ?Summary of Patient Progress: ? ? ?Patient was present for the entirety of the group session. Patient was an active listener and participated in the topic of discussion, though minimal. CSW led patients through emotional regulation activity  to facilitate discussion and illicit thoughts and feelings regarding topic. When asked what she was grateful for she stated "grateful to be here." ? ? ? ? ?Therapeutic Modalities:   ?Cognitive Behavioral Therapy ?Feelings Identification ?Dialectical Behavioral Therapy ? ? ?Cheyrl Buley A [place], LCSWA ?

## 2021-10-23 NOTE — Plan of Care (Signed)
Patient remain alert and oriented, calm and compliant during assessment. Patient report sleeping good last night. Denies pain or discomfort at this time. Denies anxiety and depression. Denies SI, HI, and AVH. Ate breakfast in the day room among peers and tolerated well. Compliant with medications. Remain safe on the unit with Q15 minute safety check. ? ?Problem: Education: ?Goal: Knowledge of [facility] Education information/materials will improve ?Outcome: Progressing ?Goal: Mental status will improve ?Outcome: Progressing ?  ?Problem: Activity: ?Goal: Interest or engagement in activities will improve ?Outcome: Progressing ?  ?

## 2021-10-23 NOTE — Plan of Care (Signed)
Patient Calm and Cooperative during shift.  Patient continues to present with a Flat affect.  Patent denies SI/HI/Avh. Patient did comply with scheduled medications. Encouraged fluids for night snack. Patent states her Psychiatrists is against utilizing  Positive Affirmations that she learned in group therapy. Emotional support and encouragement given.  ?Problem: Education: ?Goal: Knowledge of [facility] Education information/materials will improve ?Outcome: Progressing ?Goal: Mental status will improve ?Outcome: Progressing ?  ?Problem: Activity: ?Goal: Interest or engagement in activities will improve ?Outcome: Progressing ? Q 15 minute safety checks in place.  ?

## 2021-10-23 NOTE — Progress Notes (Signed)
Recreation Therapy Notes ? ?Date: 10/23/2021 ? ?Time: 10:00 am  ? ?Location: Day room    ? ?Behavioral response: Appropriate  ? ?Intervention Topic: Animal Assisted Therapy  ? ?Discussion/Intervention:  ?Animal Assisted Therapy took place today during group.  Animal Assisted Therapy is the planned inclusion of an animal in a patient's treatment plan. The patients were able to engage in therapy with an animal during group. Participants were educated on what a service dog is and the different between a support dog and a service dog. Patient were informed on how animal needs are like a person needs. Individuals were enlightened on the process to get a service animal or support animal. Patients got the opportunity to pet the animal and were offered emotional support from the animal and staff.  ?Clinical Observations/Feedback:  ?Patient came to group and was on topic and was focused on what peers and staff had to say. Participant shared their experiences and history with animals. Individual was social with peers, staff and animal while participating in group.  ?[name] LRT/CTRS  ? ? ? ? ? ? ? ?[name] ?10/23/2021 12:57 PM ?

## 2021-10-23 NOTE — Progress Notes (Signed)
[facility] MD Progress Note ? ?10/23/2021 11:49 AM ?[name]  ?MRN:  [id number] ?Subjective: [name] seen on rounds.  She is in her room.  She says that she is doing fine.  She states that she is sleeping well and her appetite is good.  Nurses notes saying that she has been pleasant and cooperative.  She is taking her medications.  She tells me that she lives with her grandmother.  She denies any side effects and there is no evidence of EPS or TD. ? ?Principal Problem: Schizophrenia, undifferentiated (HCC) ?Diagnosis: Principal Problem: ?  Schizophrenia, undifferentiated (HCC) ? ?Total Time spent with patient: 15 minutes ? ?Past Psychiatric History: She was hospitalized at [facility] in March 2022 and September 2021.  Patient has had multiple psychiatric hospitalizations in the past.  She was at [facility] hospital in 2020 secondary to similar circumstances.  She had been hospitalized apparently at an outside facility [facility]) for 4 weeks prior to being transferred to [facility].  Outpatient at the [facility]. ? ?Past Medical History:  ?Past Medical History:  ?Diagnosis Date  ? Anemia 10/02/2021  ? Non compliance w medication regimen   ? Schizophrenia (HCC)   ?  ?Past Surgical History:  ?Procedure Laterality Date  ? BIOPSY  09/25/2021  ? Procedure: BIOPSY;  Surgeon: Rachael Fee, MD;  Location: [facility];  Service: Gastroenterology;;  ? ESOPHAGOGASTRODUODENOSCOPY (EGD) WITH PROPOFOL [name] 09/25/2021  ? Procedure: ESOPHAGOGASTRODUODENOSCOPY (EGD) WITH PROPOFOL;  Surgeon: Rachael Fee, MD;  Location: [facility];  Service: Gastroenterology;  Laterality: [name];  With NGT placement  ? ?Family History: History reviewed. No pertinent family history. ? ?Social History:  ?Social History  ? ?Substance and Sexual Activity  ?Alcohol Use Not Currently  ? Comment: Refuses to disclose how much  ?   ?Social History  ? ?Substance and Sexual Activity  ?Drug Use No  ? Comment:  Refuses to answer  ?  ?Social History  ? ?Socioeconomic History  ? Marital status: Single  ?  Spouse name: Not on file  ? Number of children: Not on file  ? Years of education: Not on file  ? Highest education level: Not on file  ?Occupational History  ? Not on file  ?Tobacco Use  ? Smoking status: Some Days  ?  Packs/day: 0.50  ?  Types: Cigarettes  ? Smokeless tobacco: Never  ?Vaping Use  ? Vaping Use: Every day  ?Substance and Sexual Activity  ? Alcohol use: Not Currently  ?  Comment: Refuses to disclose how much  ? Drug use: No  ?  Comment: Refuses to answer  ? Sexual activity: Never  ?  Comment: refused to answer  ?Other Topics Concern  ? Not on file  ?Social History Narrative  ? Not on file  ? ?Social Determinants of Health  ? ?Financial Resource Strain: Not on file  ?Food Insecurity: Not on file  ?Transportation Needs: Not on file  ?Physical Activity: Not on file  ?Stress: Not on file  ?Social Connections: Not on file  ? ?Additional Social History:  ?  ?  ?  ?  ?  ?  ?  ?  ?  ?  ?  ? ?Sleep: Good ? ?Appetite:  Fair ? ?Current Medications: ?Current Facility-Administered Medications  ?Medication Dose Route Frequency Provider Last Rate Last Admin  ? (feeding supplement) PROSource Plus liquid 30 mL  30 mL Oral BID BM [name], [name], MD   30 mL at  10/23/21 1025  ? acetaminophen (TYLENOL) tablet 650 mg  650 mg Oral Q6H PRN [name], Jackquline DenmarkJohn T, MD      ? alum & mag hydroxide-simeth (MAALOX/MYLANTA) 200-200-20 MG/5ML suspension 30 mL  30 mL Oral Q4H PRN [name], [name], MD      ? benztropine (COGENTIN) tablet 0.5 mg  0.5 mg Oral BID PRN [name], Jackquline DenmarkJohn T, MD      ? clonazePAM Scarlette Calico(KLONOPIN) disintegrating tablet 0.25 mg  0.25 mg Oral BID [name], [name], MD   0.25 mg at 10/23/21 1024  ? haloperidol (HALDOL) tablet 5 mg  5 mg Oral BID [name], [name], MD   5 mg at 10/23/21 1024  ? magnesium hydroxide (MILK OF MAGNESIA) suspension 30 mL  30 mL Oral Daily PRN [name], Jackquline DenmarkJohn T, MD      ? menthol-cetylpyridinium (CEPACOL) lozenge 3  mg  1 lozenge Oral PRN [name], [name], MD      ? midodrine (PROAMATINE) tablet 2.5 mg  2.5 mg Oral TID WC [name], Jackquline DenmarkJohn T, MD   2.5 mg at 10/23/21 16100823  ? mirtazapine (REMERON SOL-TAB) disintegrating tablet 7.5 mg  7.5 mg Oral QHS [name], [name], MD   7.5 mg at 10/22/21 2155  ? multivitamin with minerals tablet 1 tablet  1 tablet Oral Daily [name], Jackquline DenmarkJohn T, MD   1 tablet at 10/23/21 1024  ? OLANZapine zydis (ZYPREXA) disintegrating tablet 5 mg  5 mg Oral QHS Sarina IllHerrick, Sakari Raisanen Edward, DO   5 mg at 10/22/21 2145  ? thiamine tablet 100 mg  100 mg Oral Daily [name], Jackquline DenmarkJohn T, MD   100 mg at 10/23/21 1024  ? traZODone (DESYREL) tablet 50 mg  50 mg Oral QHS PRN [name], Jackquline DenmarkJohn T, MD      ? ? ?Lab Results: No results found for this or any previous visit (from the past 48 hour(s)). ? ?Blood Alcohol level:  ?Lab Results  ?Component Value Date  ? ETH <10 09/12/2021  ? ETH <10 10/01/2020  ? ? ?Metabolic Disorder Labs: ?Lab Results  ?Component Value Date  ? HGBA1C 5.6 10/21/2021  ? MPG 114 10/21/2021  ? MPG 111 10/02/2021  ? ?Lab Results  ?Component Value Date  ? PROLACTIN 13.6 04/16/2015  ? ?Lab Results  ?Component Value Date  ? CHOL 189 10/21/2021  ? TRIG 52 10/21/2021  ? HDL 50 10/21/2021  ? CHOLHDL 3.8 10/21/2021  ? VLDL 10 10/21/2021  ? LDLCALC 129 (H) 10/21/2021  ? LDLCALC 93 10/01/2020  ? ? ?Physical Findings: ?AIMS:  , ,  ,  ,    ?CIWA:    ?COWS:    ? ?Musculoskeletal: ?Strength & Muscle Tone: within normal limits ?Gait & Station: normal ?Patient leans: [name] ? ?Psychiatric Specialty Exam: ? ?Presentation  ?General Appearance: Casual; Appropriate for Environment; Fairly Groomed ? ?Eye Contact:Fair ? ?Speech:Slow ? ?Speech Volume:Decreased ? ?Handedness:Right ? ? ?Mood and Affect  ?Mood:Anxious; Depressed ? ?Affect:Blunt ? ? ?Thought Process  ?Thought Processes:Disorganized ? ?Descriptions of Associations:Loose ? ?Orientation:Partial ? ?Thought Content:Delusions; Illogical; Paranoid Ideation ? ?History of  Schizophrenia/Schizoaffective disorder:Yes ? ?Duration of Psychotic Symptoms:Greater than six months ? ?Hallucinations:No data recorded ?Ideas of Reference:Paranoia ? ?Suicidal Thoughts:No data recorded ?Homicidal Thoughts:No data recorded ? ?Sensorium  ?Memory:Recent Poor; Remote Poor ? ?Judgment:Impaired ? ?Insight:Fair ? ? ?Executive Functions  ?Concentration:Poor ? ?Attention Span:Poor ? ?Recall:Poor ? ?Fund of Knowledge:Poor ? ?Language:Fair ? ? ?Psychomotor Activity  ?Psychomotor Activity:No data recorded ? ?Assets  ?Assets:Communication Skills; Resilience; Financial Resources/Insurance; Desire for Improvement; Physical  Health; Social Support ? ? ?Sleep  ?Sleep:No data recorded ? ? ?Physical Exam: ?Physical Exam ?Vitals and nursing note reviewed.  ?Constitutional:   ?   Appearance: Normal appearance. She is normal weight.  ?Neurological:  ?   General: No focal deficit present.  ?   Mental Status: She is alert and oriented to person, place, and time.  ?Psychiatric:     ?   Attention and Perception: Attention and perception normal.     ?   Mood and Affect: Mood and affect normal.     ?   Speech: Speech normal.     ?   Behavior: Behavior normal. Behavior is cooperative.     ?   Thought Content: Thought content normal.     ?   Cognition and Memory: Cognition and memory normal.     ?   Judgment: Judgment normal.  ? ?Review of Systems  ?Constitutional: Negative.   ?HENT: Negative.    ?Eyes: Negative.   ?Respiratory: Negative.    ?Cardiovascular: Negative.   ?Gastrointestinal: Negative.   ?Genitourinary: Negative.   ?Musculoskeletal: Negative.   ?Skin: Negative.   ?Neurological: Negative.   ?Endo/Heme/Allergies: Negative.   ?Psychiatric/Behavioral: Negative.    ?Blood pressure (!) 107/55, pulse (!) 52, temperature 98 ?F (36.7 ?C), temperature source Oral, resp. rate 18, height 5\' 4"  (1.626 m), weight 44.5 kg, SpO2 99 %. Body mass index is 16.82 kg/m?. ? ? ?Treatment Plan Summary: ?Daily contact with patient to assess  and evaluate symptoms and progress in treatment, Medication management, and Plan continue current medications consider a long-acting injection.  She has been on 1 before.  I need to look into her living situation. ? ?Richar

## 2021-10-23 NOTE — Plan of Care (Signed)
Patient Calm and Cooperative. Patient denies SI/HI/AVH and contracts for safety.  Patient Affect is somewhat Flat.  Presents Isolative wit minimal interactions on therapeutic milieu.  Patient did comply with scheduled medications as ordered.  Encouraged fluids due to low b/p. Q 15 minute safety rounding in place.  Informed pt to notify Nursing Staff if any needs arise.  ? ? ?Problem: Education: ?Goal: Knowledge of [place] [facility] Education information/materials will improve ?Outcome: Progressing ?Goal: Mental status will improve ?Outcome: Progressing ?  ?Problem: Activity: ?Goal: Interest or engagement in activities will improve ?Outcome: Progressing ?   ?

## 2021-10-23 NOTE — [facility] Group Notes (Signed)
[facility] Group Notes:  (Nursing/MHT/Case Management/Adjunct) ? ?Date:  10/22/2021 ?Time:  10:00 AM ? ?Type of Therapy:  Psychoeducational Skills ? ?Participation Level:  Active ? ?Participation Quality:  Appropriate and Attentive ? ?Affect:  Appropriate ? ?Cognitive:  Appropriate ? ?Insight:  Appropriate ? ?Engagement in Group:  Engaged ? ?Modes of Intervention:  Discussion ? ?Summary of Progress/Problems: ?The pt attended group and participated in the activity, as well as shared during discussion. ?Barbaraann Rondo ?10/23/2021, 11:28 AM ?

## 2021-10-24 NOTE — Progress Notes (Signed)
Recreation Therapy Notes ? ?Date: 10/24/2021 ? ?Time: 1:25 PM   ? ?Location: Day room   ? ?Behavioral response: [name] ?  ?Intervention Topic: Social Skills   ? ?Discussion/Intervention: ?Patient refused to attend group.  ? ?Clinical Observations/Feedback:  ?Patient refused to attend group.  ?  ?Amaryllis Malmquist LRT/CTRS ? ? ? ? ? ? ? ?Aqil Goetting ?10/24/2021 3:30 PM ?

## 2021-10-24 NOTE — [facility] Counselor (Signed)
CSW contacted pt's guardian [name],(Legal Guardian) [phone] regarding discharge planning.  ? ?CSW informed guardian that pt was eating and taking medications on the unit.  ? ?She informed CSW that pt could return home where pt is living with her mother. She stated that pt does not have a provider and would be interested in referral to an ACT team in the area. She stated that pt has not been "compliant" with following up at the [place] in the past and thinks that an ACT team could be useful.  ?CSW will send out referrals to local ACTT teams.  ? ?She also informed CSW that pt would be visiting on the unit this weekend.  ?No other requests were made. Conversation ended without incident.  ? ?[name], MSW, LCSW-A ?4/7/202312:07 PM  ?

## 2021-10-24 NOTE — Progress Notes (Signed)
[facility] MD Progress Note ? ?10/24/2021 10:53 AM ?[name]  ?MRN:  [id number] ?Subjective: Patient was seen on rounds.  Nursing staff tells me that she is eating and drinking well.  She tells me that she feels good.  She is pleasant and cooperative.  She denies any [name] effects from her medication.  Her affect is improving.  She has been compliant with her medications.  She denies any auditory or visual hallucinations.  She denies any suicidal ideation. ? ?Principal Problem: Schizophrenia, undifferentiated (HCC) ?Diagnosis: Principal Problem: ?  Schizophrenia, undifferentiated (HCC) ? ?Total Time spent with patient: 15 minutes ? ?Past Psychiatric History: Schizophrenia ? ?Past Medical History:  ?Past Medical History:  ?Diagnosis Date  ? Anemia 10/02/2021  ? Non compliance w medication regimen   ? Schizophrenia (HCC)   ?  ?Past Surgical History:  ?Procedure Laterality Date  ? BIOPSY  09/25/2021  ? Procedure: BIOPSY;  Surgeon: Rachael Fee, MD;  Location: [facility];  Service: Gastroenterology;;  ? ESOPHAGOGASTRODUODENOSCOPY (EGD) WITH PROPOFOL [name] 09/25/2021  ? Procedure: ESOPHAGOGASTRODUODENOSCOPY (EGD) WITH PROPOFOL;  Surgeon: Rachael Fee, MD;  Location: [facility];  Service: Gastroenterology;  Laterality: [name];  With NGT placement  ? ?Family History: History reviewed. No pertinent family history. ? ?Social History:  ?Social History  ? ?Substance and Sexual Activity  ?Alcohol Use Not Currently  ? Comment: Refuses to disclose how much  ?   ?Social History  ? ?Substance and Sexual Activity  ?Drug Use No  ? Comment: Refuses to answer  ?  ?Social History  ? ?Socioeconomic History  ? Marital status: Single  ?  Spouse name: Not on file  ? Number of children: Not on file  ? Years of education: Not on file  ? Highest education level: Not on file  ?Occupational History  ? Not on file  ?Tobacco Use  ? Smoking status: Some Days  ?  Packs/day: 0.50  ?  Types: Cigarettes  ? Smokeless tobacco: Never  ?Vaping Use  ? Vaping Use:  Every day  ?Substance and Sexual Activity  ? Alcohol use: Not Currently  ?  Comment: Refuses to disclose how much  ? Drug use: No  ?  Comment: Refuses to answer  ? Sexual activity: Never  ?  Comment: refused to answer  ?Other Topics Concern  ? Not on file  ?Social History Narrative  ? Not on file  ? ?Social Determinants of Health  ? ?Financial Resource Strain: Not on file  ?Food Insecurity: Not on file  ?Transportation Needs: Not on file  ?Physical Activity: Not on file  ?Stress: Not on file  ?Social Connections: Not on file  ? ?Additional Social History:  ?  ?  ?  ?  ?  ?  ?  ?  ?  ?  ?  ? ?Sleep: Good ? ?Appetite:  Good ? ?Current Medications: ?Current Facility-Administered Medications  ?Medication Dose Route Frequency Provider Last Rate Last Admin  ? (feeding supplement) PROSource Plus liquid 30 mL  30 mL Oral BID BM [name], [name], MD   30 mL at 10/24/21 1049  ? acetaminophen (TYLENOL) tablet 650 mg  650 mg Oral Q6H PRN [name], [name], MD      ? alum & mag hydroxide-simeth (MAALOX/MYLANTA) 200-200-20 MG/5ML suspension 30 mL  30 mL Oral Q4H PRN [name], [name], MD      ? benztropine (COGENTIN) tablet 0.5 mg  0.5 mg Oral BID PRN [name], [name], MD      ?  clonazePAM (KLONOPIN) disintegrating tablet 0.25 mg  0.25 mg Oral BID [name], [name], MD   0.25 mg at 10/24/21 0930  ? haloperidol (HALDOL) tablet 5 mg  5 mg Oral BID [name], [name], MD   5 mg at 10/24/21 0930  ? magnesium hydroxide (MILK OF MAGNESIA) suspension 30 mL  30 mL Oral Daily PRN [name], Jackquline DenmarkJohn T, MD      ? menthol-cetylpyridinium (CEPACOL) lozenge 3 mg  1 lozenge Oral PRN [name], Jackquline DenmarkJohn T, MD      ? midodrine (PROAMATINE) tablet 2.5 mg  2.5 mg Oral TID WC [name], Jackquline DenmarkJohn T, MD   2.5 mg at 10/24/21 16100821  ? mirtazapine (REMERON SOL-TAB) disintegrating tablet 7.5 mg  7.5 mg Oral QHS [name], [name], MD   7.5 mg at 10/23/21 2150  ? multivitamin with minerals tablet 1 tablet  1 tablet Oral Daily [name], Jackquline DenmarkJohn T, MD   1 tablet at 10/24/21 0930  ? OLANZapine  zydis (ZYPREXA) disintegrating tablet 5 mg  5 mg Oral QHS Sarina IllHerrick, Arath Kaigler Edward, DO   5 mg at 10/23/21 2136  ? thiamine tablet 100 mg  100 mg Oral Daily [name], Jackquline DenmarkJohn T, MD   100 mg at 10/24/21 0930  ? traZODone (DESYREL) tablet 50 mg  50 mg Oral QHS PRN [name], Jackquline DenmarkJohn T, MD      ? ? ?Lab Results: No results found for this or any previous visit (from the past 48 hour(s)). ? ?Blood Alcohol level:  ?Lab Results  ?Component Value Date  ? ETH <10 09/12/2021  ? ETH <10 10/01/2020  ? ? ?Metabolic Disorder Labs: ?Lab Results  ?Component Value Date  ? HGBA1C 5.6 10/21/2021  ? MPG 114 10/21/2021  ? MPG 111 10/02/2021  ? ?Lab Results  ?Component Value Date  ? PROLACTIN 13.6 04/16/2015  ? ?Lab Results  ?Component Value Date  ? CHOL 189 10/21/2021  ? TRIG 52 10/21/2021  ? HDL 50 10/21/2021  ? CHOLHDL 3.8 10/21/2021  ? VLDL 10 10/21/2021  ? LDLCALC 129 (H) 10/21/2021  ? LDLCALC 93 10/01/2020  ? ? ?Physical Findings: ?AIMS:  , ,  ,  ,    ?CIWA:    ?COWS:    ? ?Musculoskeletal: ?Strength & Muscle Tone: within normal limits ?Gait & Station: normal ?Patient leans: [name] ? ?Psychiatric Specialty Exam: ? ?Presentation  ?General Appearance: Casual; Appropriate for Environment; Fairly Groomed ? ?Eye Contact:Fair ? ?Speech:Slow ? ?Speech Volume:Decreased ? ?Handedness:Right ? ? ?Mood and Affect  ?Mood:Anxious; Depressed ? ?Affect:Blunt ? ? ?Thought Process  ?Thought Processes:Disorganized ? ?Descriptions of Associations:Loose ? ?Orientation:Partial ? ?Thought Content:Delusions; Illogical; Paranoid Ideation ? ?History of Schizophrenia/Schizoaffective disorder:Yes ? ?Duration of Psychotic Symptoms:Greater than six months ? ?Hallucinations:No data recorded ?Ideas of Reference:Paranoia ? ?Suicidal Thoughts:No data recorded ?Homicidal Thoughts:No data recorded ? ?Sensorium  ?Memory:Recent Poor; Remote Poor ? ?Judgment:Impaired ? ?Insight:Fair ? ? ?Executive Functions  ?Concentration:Poor ? ?Attention Span:Poor ? ?Recall:Poor ? ?Fund of  Knowledge:Poor ? ?Language:Fair ? ? ?Psychomotor Activity  ?Psychomotor Activity:No data recorded ? ?Assets  ?Assets:Communication Skills; Resilience; Financial Resources/Insurance; Desire for Improvement; Physical Health; Social Support ? ? ?Sleep  ?Sleep:No data recorded ? ? ?Physical Exam: ?Physical Exam ?Vitals and nursing note reviewed.  ?Constitutional:   ?   Appearance: Normal appearance. She is normal weight.  ?Neurological:  ?   General: No focal deficit present.  ?   Mental Status: She is alert and oriented to person, place, and time.  ?Psychiatric:     ?   Attention  and Perception: Attention and perception normal.     ?   Mood and Affect: Mood and affect normal.     ?   Behavior: Behavior normal. Behavior is cooperative.     ?   Thought Content: Thought content normal.     ?   Cognition and Memory: Cognition and memory normal.     ?   Judgment: Judgment normal.  ? ?Review of Systems  ?Constitutional: Negative.   ?HENT: Negative.    ?Eyes: Negative.   ?Respiratory: Negative.    ?Cardiovascular: Negative.   ?Gastrointestinal: Negative.   ?Genitourinary: Negative.   ?Musculoskeletal: Negative.   ?Skin: Negative.   ?Neurological: Negative.   ?Endo/Heme/Allergies: Negative.   ?Psychiatric/Behavioral: Negative.    ?Blood pressure 93/61, pulse (!) 58, temperature 98 ?F (36.7 ?C), temperature source Oral, resp. rate 16, height 5\' 4"  (1.626 m), weight 44.5 kg, SpO2 100 %. Body mass index is 16.82 kg/m?. ? ? ?Treatment Plan Summary: ?Daily contact with patient to assess and evaluate symptoms and progress in treatment, Medication management, and Plan continue current medications. ? ? , DO ?10/24/2021, 10:53 AM ? ?

## 2021-10-24 NOTE — Plan of Care (Signed)
Patient remain alert and oriented, calm and cooperative during assessment. Denies anxiety, depression, pain or discomfort at this time. Denies SI, HI, and AVH. Ate meals in the day room among staff and peers with good appetite. Compliant with all due medications. Currently in the day room among peers watching TV in no apparent distress. Remain safe on the unit with Q 15 minute safety check. ? ?Problem: Education: ?Goal: Knowledge of [place] [facility] Education information/materials will improve ?Outcome: Progressing ?Goal: Mental status will improve ?Outcome: Progressing ?  ?Problem: Activity: ?Goal: Interest or engagement in activities will improve ?Outcome: Progressing ?  ?

## 2021-10-24 NOTE — Plan of Care (Signed)
Patient calm and cooperative during shift.  Presents Flat and continues to isolate. Patient denies SI/HI/AVH and contracts for safety.  Patient denies Depression and Anxiety.  Patient did comply with scheduled medications.  Encouraged fluids and increased intake.  Q 15 minute safety checks in place.  Plan of care will continue.   ? ?Problem: Education: ?Goal: Knowledge of [place] [facility] Education information/materials will improve ?Outcome: Progressing ?Goal: Mental status will improve ?Outcome: Progressing ?  ?Problem: Activity: ?Goal: Interest or engagement in activities will improve ?10/24/2021 2100 by Theodosia Quay, RN ?Outcome: Not Progressing ?10/24/2021 2059 by Theodosia Quay, RN ?Outcome: Progressing ?  ?

## 2021-10-25 NOTE — Group Note (Signed)
LCSW Group Therapy Note ? ?Group Date: 10/25/2021 ?Start Time: 1300 ?End Time: 1400 ? ? ?Type of Therapy and Topic:  Group Therapy - Healthy vs Unhealthy Coping Skills ? ?Participation Level:  Did Not Attend  ? ?Description of Group ?The focus of this group was to determine what unhealthy coping techniques typically are used by group members and what healthy coping techniques would be helpful in coping with various problems. Patients were guided in becoming aware of the differences between healthy and unhealthy coping techniques. Patients were asked to identify 2-3 healthy coping skills they would like to learn to use more effectively. ? ?Therapeutic Goals ?Patients learned that coping is what human beings do all day long to deal with various situations in their lives ?Patients defined and discussed healthy vs unhealthy coping techniques ?Patients identified their preferred coping techniques and identified whether these were healthy or unhealthy ?Patients determined 2-3 healthy coping skills they would like to become more familiar with and use more often. ?Patients provided support and ideas to each other ? ? ?Summary of Patient Progress: Due to limited staffing, group was not held on the unit.  ? ?Therapeutic Modalities ?Cognitive Behavioral Therapy ?Motivational Interviewing ? ?[name] K Ashden Sonnenberg, LCSWA ?10/25/2021  3:59 PM   ?

## 2021-10-25 NOTE — Progress Notes (Signed)
Patient presents alert and oriented. Patient affect is calm and cooperative. Patient reports sleeping well last night.  Patient denies AVH, SI, HI, depression, anxiety or pain.   Patient compliant with all scheduled meds. Patient ate breakfast and lunch in the day room observed appropriate interaction with staff and peers. Ongoing Q15 minute safety check rounds per unit protocol.  ?

## 2021-10-25 NOTE — BH IP Treatment Plan (Signed)
Interdisciplinary Treatment and Diagnostic Plan Update ? ?10/25/2021 ?Time of Session: 9:45AM ?[name] ?MRN: [id number] ? ?Principal Diagnosis: Schizophrenia, undifferentiated (HCC) ? ?Secondary Diagnoses: Principal Problem: ?  Schizophrenia, undifferentiated (HCC) ? ? ?Current Medications:  ?Current Facility-Administered Medications  ?Medication Dose Route Frequency Provider Last Rate Last Admin  ? (feeding supplement) PROSource Plus liquid 30 mL  30 mL Oral BID BM [name], Paula T, MD   30 mL at 10/25/21 9935  ? acetaminophen (TYLENOL) tablet 650 mg  650 mg Oral Q6H PRN [name], Paula Denmark, MD      ? alum & mag hydroxide-simeth (MAALOX/MYLANTA) 200-200-20 MG/5ML suspension 30 mL  30 mL Oral Q4H PRN [name], Paula T, MD      ? benztropine (COGENTIN) tablet 0.5 mg  0.5 mg Oral BID PRN [name], Paula Denmark, MD      ? clonazePAM [name]) disintegrating tablet 0.25 mg  0.25 mg Oral BID [name], Paula T, MD   0.25 mg at 10/25/21 7017  ? haloperidol (HALDOL) tablet 5 mg  5 mg Oral BID [name], Paula T, MD   5 mg at 10/25/21 7939  ? magnesium hydroxide (MILK OF MAGNESIA) suspension 30 mL  30 mL Oral Daily PRN [name], Paula Denmark, MD      ? menthol-cetylpyridinium (CEPACOL) lozenge 3 mg  1 lozenge Oral PRN [name], Paula T, MD      ? midodrine (PROAMATINE) tablet 2.5 mg  2.5 mg Oral TID WC [name], Paula Denmark, MD   2.5 mg at 10/25/21 0836  ? mirtazapine (REMERON SOL-TAB) disintegrating tablet 7.5 mg  7.5 mg Oral QHS [name], Paula T, MD   7.5 mg at 10/24/21 2150  ? multivitamin with minerals tablet 1 tablet  1 tablet Oral Daily [name], Paula Denmark, MD   1 tablet at 10/25/21 0913  ? OLANZapine zydis (ZYPREXA) disintegrating tablet 5 mg  5 mg Oral QHS Paula Ill, DO   5 mg at 10/24/21 2150  ? thiamine tablet 100 mg  100 mg Oral Daily [name], Paula Denmark, MD   100 mg at 10/25/21 0913  ? traZODone (DESYREL) tablet 50 mg  50 mg Oral QHS PRN [name], Paula Denmark, MD      ? ?PTA Medications: ?Medications Prior to Admission  ?Medication Sig Dispense  Refill Last Dose  ? benztropine (COGENTIN) 0.5 MG tablet Take 1 tablet (0.5 mg total) by mouth 2 (two) times daily as needed for tremors.     ? clonazePAM (KLONOPIN) 0.5 MG tablet Place 1 tablet (0.5 mg total) into feeding tube 2 (two) times daily. 30 tablet 0   ? haloperidol (HALDOL) 5 MG tablet Take 1 tablet (5 mg total) by mouth 2 (two) times daily.     ? midodrine (PROAMATINE) 2.5 MG tablet Take 1 tablet (2.5 mg total) by mouth 3 (three) times daily with meals.  0   ? mirtazapine (REMERON SOL-TAB) 15 MG disintegrating tablet Take 0.5 tablets (7.5 mg total) by mouth at bedtime. 30 tablet 0   ? Multiple Vitamin (MULTIVITAMIN WITH MINERALS) TABS tablet Take 1 tablet by mouth daily.     ? thiamine 100 MG tablet Take 1 tablet (100 mg total) by mouth daily.     ? traZODone (DESYREL) 50 MG tablet Take 1 tablet (50 mg total) by mouth at bedtime as needed for sleep. 30 tablet 0   ? ? ?Patient Stressors: Health problems   ? ?Patient Strengths: Other: Unable to determined at this time ? ?Treatment Modalities: Medication Management, Group therapy, Case management,  ?  1 to 1 session with clinician, Psychoeducation, Recreational therapy. ? ? ?Physician Treatment Plan for Primary Diagnosis: Schizophrenia, undifferentiated (HCC) ?Long Term Goal(s): Improvement in symptoms so as ready for discharge  ? ?Short Term Goals: Ability to disclose and discuss suicidal ideas ?Ability to identify and develop effective coping behaviors will improve ?Ability to verbalize feelings will improve ?Ability to maintain clinical measurements within normal limits will improve ?Compliance with prescribed medications will improve ? ?Medication Management: Evaluate patient's response, side effects, and tolerance of medication regimen. ? ?Therapeutic Interventions: 1 to 1 sessions, Unit Group sessions and Medication administration. ? ?Evaluation of Outcomes: Progressing ? ?Physician Treatment Plan for Secondary Diagnosis: Principal Problem: ?   Schizophrenia, undifferentiated (HCC) ? ?Long Term Goal(s): Improvement in symptoms so as ready for discharge  ? ?Short Term Goals: Ability to disclose and discuss suicidal ideas ?Ability to identify and develop effective coping behaviors will improve ?Ability to verbalize feelings will improve ?Ability to maintain clinical measurements within normal limits will improve ?Compliance with prescribed medications will improve    ? ?Medication Management: Evaluate patient's response, side effects, and tolerance of medication regimen. ? ?Therapeutic Interventions: 1 to 1 sessions, Unit Group sessions and Medication administration. ? ?Evaluation of Outcomes: Progressing ? ? ?RN Treatment Plan for Primary Diagnosis: Schizophrenia, undifferentiated (HCC) ?Long Term Goal(s): Knowledge of disease and therapeutic regimen to maintain health will improve ? ?Short Term Goals: Ability to remain free from injury will improve, Ability to verbalize frustration and anger appropriately will improve, Ability to demonstrate self-control, Ability to participate in decision making will improve, Ability to verbalize feelings will improve, Ability to disclose and discuss suicidal ideas, Ability to identify and develop effective coping behaviors will improve, and Compliance with prescribed medications will improve ? ?Medication Management: RN will administer medications as ordered by provider, will assess and evaluate patient's response and provide education to patient for prescribed medication. RN will report any adverse and/or side effects to prescribing provider. ? ?Therapeutic Interventions: 1 on 1 counseling sessions, Psychoeducation, Medication administration, Evaluate responses to treatment, Monitor vital signs and CBGs as ordered, Perform/monitor CIWA, COWS, AIMS and Fall Risk screenings as ordered, Perform wound care treatments as ordered. ? ?Evaluation of Outcomes: Progressing ? ? ?LCSW Treatment Plan for Primary Diagnosis:  Schizophrenia, undifferentiated (HCC) ?Long Term Goal(s): Safe transition to appropriate next level of care at discharge, Engage patient in therapeutic group addressing interpersonal concerns. ? ?Short Term Goals: Engage patient in aftercare planning with referrals and resources, Increase social support, Increase ability to appropriately verbalize feelings, Increase emotional regulation, Facilitate acceptance of mental health diagnosis and concerns, Identify triggers associated with mental health/substance abuse issues, and Increase skills for wellness and recovery ? ?Therapeutic Interventions: Assess for all discharge needs, 1 to 1 time with Child psychotherapist, Explore available resources and support systems, Assess for adequacy in community support network, Educate family and significant other(s) on suicide prevention, Complete Psychosocial Assessment, Interpersonal group therapy. ? ?Evaluation of Outcomes: Progressing ? ? ?Progress in Treatment: ?Attending groups: Yes. ?Participating in groups: Yes. ?Taking medication as prescribed: Yes. ?Toleration medication: Yes. ?Family/Significant other contact made: Yes, individual(s) contacted:  SPE completed with [name] Mirando-Lennon, legal guardian/sister. ?Patient understands diagnosis: No. ?Discussing patient identified problems/goals with staff: Yes. ?Medical problems stabilized or resolved: Yes. ?Denies suicidal/homicidal ideation: Yes. ?Issues/concerns per patient self-inventory: Yes. ?Other: none. ? ?New problem(s) identified: No, Describe:  none. ? ?New Short Term/Long Term Goal(s): Patient to work towards elimination of symptoms of psychosis, medication management for mood stabilization; development of comprehensive  mental wellness plan. Update 10/25/2021: No changes at this time.  ? ?Patient Goals:  Patient simply states, "want to go home." Update 10/25/2021: No changes at this time.  ? ?Discharge Plan or Barriers: No psychosocial barriers identified at this time,  assuming patient will return to place of residence when appropriate for discharge. CSW will confirm with legal guardian. Update 10/25/2021: Per legal guardian, patient to return to reside with patient's mother when appropriate

## 2021-10-25 NOTE — Progress Notes (Signed)
[facility] ? ?10/25/2021 10:34 AM ?[name]  ?MRN:  [id number] ?Subjective: Patient is seen on rounds.  She is taking her medications without any problems.  She states that she feels well.  She is eating and drinking and sleeping well.  No side effects from the medications. ? ?Principal Problem: Schizophrenia, undifferentiated (HCC) ?Diagnosis: Principal Problem: ?  Schizophrenia, undifferentiated (HCC) ? ?Total Time spent with patient: 15 minutes ? ?Past Psychiatric History: Schizophrenia and catatonia ? ?Past Medical History:  ?Past Medical History:  ?Diagnosis Date  ? Anemia 10/02/2021  ? Non compliance w medication regimen   ? Schizophrenia (HCC)   ?  ?Past Surgical History:  ?Procedure Laterality Date  ? BIOPSY  09/25/2021  ? Procedure: BIOPSY;  Surgeon: Rachael FeeJacobs, Daniel P, MD;  [facility];  Service: Gastroenterology;;  ? ESOPHAGOGASTRODUODENOSCOPY (EGD) WITH PROPOFOL [name] 09/25/2021  ? Procedure: ESOPHAGOGASTRODUODENOSCOPY (EGD) WITH PROPOFOL;  Surgeon: Rachael FeeJacobs, Daniel P, MD;  Location: [facility];  Service: Gastroenterology;  Laterality: [name];  With NGT placement  ? ?Family History: History reviewed. No pertinent family history. ? ?Social History:  ?Social History  ? ?Substance and Sexual Activity  ?Alcohol Use Not Currently  ? Comment: Refuses to disclose how much  ?   ?Social History  ? ?Substance and Sexual Activity  ?Drug Use No  ? Comment: Refuses to answer  ?  ?Social History  ? ?Socioeconomic History  ? Marital status: Single  ?  Spouse name: Not on file  ? Number of children: Not on file  ? Years of education: Not on file  ? Highest education level: Not on file  ?Occupational History  ? Not on file  ?Tobacco Use  ? Smoking status: Some Days  ?  Packs/day: 0.50  ?  Types: Cigarettes  ? Smokeless tobacco: Never  ?Vaping Use  ? Vaping Use: Every day  ?Substance and Sexual Activity  ? Alcohol use: Not Currently  ?  Comment: Refuses to disclose how much  ? Drug use: No  ?  Comment: Refuses to  answer  ? Sexual activity: Never  ?  Comment: refused to answer  ?Other Topics Concern  ? Not on file  ?Social History Narrative  ? Not on file  ? ?Social Determinants of Health  ? ?Financial Resource Strain: Not on file  ?Food Insecurity: Not on file  ?Transportation Needs: Not on file  ?Physical Activity: Not on file  ?Stress: Not on file  ?Social Connections: Not on file  ? ?Additional Social History:  ?  ?  ?  ?  ?  ?  ?  ?  ?  ?  ?  ? ?Sleep: Good ? ?Appetite:  Good ? ?Current Medications: ?Current Facility-Administered Medications  ?Medication Dose Route Frequency Provider Last Rate Last Admin  ? (feeding supplement) PROSource Plus liquid 30 mL  30 mL Oral BID BM [name], [name], MD   30 mL at 10/25/21 [id number]  ? acetaminophen (TYLENOL) tablet 650 mg  650 mg Oral Q6H PRN [name], Jackquline DenmarkJohn T, MD      ? alum & mag hydroxide-simeth (MAALOX/MYLANTA) 200-200-20 MG/5ML suspension 30 mL  30 mL Oral Q4H PRN [name], [name], MD      ? benztropine (COGENTIN) tablet 0.5 mg  0.5 mg Oral BID PRN [name], Jackquline DenmarkJohn T, MD      ? clonazePAM Scarlette Calico(KLONOPIN) disintegrating tablet 0.25 mg  0.25 mg Oral BID [name], [name], MD   0.25 mg at 10/25/21 0914  ? haloperidol (HALDOL) tablet  5 mg  5 mg Oral BID [name], [name], MD   5 mg at 10/25/21 2725  ? magnesium hydroxide (MILK OF MAGNESIA) suspension 30 mL  30 mL Oral Daily PRN [name], [name], MD      ? menthol-cetylpyridinium (CEPACOL) lozenge 3 mg  1 lozenge Oral PRN [name], [name], MD      ? midodrine (PROAMATINE) tablet 2.5 mg  2.5 mg Oral TID WC [name], [name], MD   2.5 mg at 10/25/21 0836  ? mirtazapine (REMERON SOL-TAB) disintegrating tablet 7.5 mg  7.5 mg Oral QHS [name], [name], MD   7.5 mg at 10/24/21 2150  ? multivitamin with minerals tablet 1 tablet  1 tablet Oral Daily [name], [name], MD   1 tablet at 10/25/21 0913  ? OLANZapine zydis (ZYPREXA) disintegrating tablet 5 mg  5 mg Oral QHS [name], DO   5 mg at 10/24/21 2150  ? thiamine tablet 100 mg  100 mg Oral Daily  [name], [name], MD   100 mg at 10/25/21 0913  ? traZODone (DESYREL) tablet 50 mg  50 mg Oral QHS PRN [name], [name], MD      ? ? ?Lab Results: No results found for this or any previous visit (from the past 48 hour(s)). ? ?Blood Alcohol level:  ?Lab Results  ?Component Value Date  ? ETH <10 09/12/2021  ? ETH <10 10/01/2020  ? ? ?Metabolic Disorder Labs: ?Lab Results  ?Component Value Date  ? HGBA1C 5.6 10/21/2021  ? MPG 114 10/21/2021  ? MPG 111 10/02/2021  ? ?Lab Results  ?Component Value Date  ? PROLACTIN 13.6 04/16/2015  ? ?Lab Results  ?Component Value Date  ? CHOL 189 10/21/2021  ? TRIG 52 10/21/2021  ? HDL 50 10/21/2021  ? CHOLHDL 3.8 10/21/2021  ? VLDL 10 10/21/2021  ? LDLCALC 129 (H) 10/21/2021  ? LDLCALC 93 10/01/2020  ? ? ?Physical Findings: ?AIMS:  , ,  ,  ,    ?CIWA:    ?COWS:    ? ?Musculoskeletal: ?Strength & Muscle Tone: within normal limits ?Gait & Station: normal ?Patient leans: [name] ? ?Psychiatric Specialty Exam: ? ?Presentation  ?General Appearance: Casual; Appropriate for Environment; Fairly Groomed ? ?Eye Contact:Fair ? ?Speech:Slow ? ?Speech Volume:Decreased ? ?Handedness:Right ? ? ?Mood and Affect  ?Mood:Anxious; Depressed ? ?Affect:Blunt ? ? ?Thought Process  ?Thought Processes:Disorganized ? ?Descriptions of Associations:Loose ? ?Orientation:Partial ? ?Thought Content:Delusions; Illogical; Paranoid Ideation ? ?History of Schizophrenia/Schizoaffective disorder:Yes ? ?Duration of Psychotic Symptoms:Greater than six months ? ?Hallucinations:No data recorded ?Ideas of Reference:Paranoia ? ?Suicidal Thoughts:No data recorded ?Homicidal Thoughts:No data recorded ? ?Sensorium  ?Memory:Recent Poor; Remote Poor ? ?Judgment:Impaired ? ?Insight:Fair ? ? ?Executive Functions  ?Concentration:Poor ? ?Attention Span:Poor ? ?Recall:Poor ? ?Fund of Knowledge:Poor ? ?Language:Fair ? ? ?Psychomotor Activity  ?Psychomotor Activity:No data recorded ? ?Assets  ?Assets:Communication Skills; Resilience; Financial  Resources/Insurance; Desire for Improvement; Physical Health; Social Support ? ? ?Sleep  ?Sleep:No data recorded ? ? ?Physical Exam: ?Physical Exam ?Vitals and nursing note reviewed.  ?Constitutional:   ?   Appearance: Normal appearance. She is normal weight.  ?Neurological:  ?   General: No focal deficit present.  ?   Mental Status: She is alert and oriented to person, place, and time.  ?Psychiatric:     ?   Mood and Affect: Mood normal.     ?   Behavior: Behavior normal.  ? ?Review of Systems  ?Constitutional: Negative.   ?HENT: Negative.    ?  Eyes: Negative.   ?Respiratory: Negative.    ?Cardiovascular: Negative.   ?Gastrointestinal: Negative.   ?Genitourinary: Negative.   ?Musculoskeletal: Negative.   ?Skin: Negative.   ?Neurological: Negative.   ?Endo/Heme/Allergies: Negative.   ?Psychiatric/Behavioral: Negative.    ?Blood pressure (!) 94/53, pulse (!) 55, temperature 98.2 ?F (36.8 ?C), temperature source Oral, resp. rate 20, height 5\' 4"  (1.626 m), weight 44.5 kg, SpO2 100 %. Body mass index is 16.82 kg/m?. ? ? ?Treatment Plan Summary: ?Daily contact with patient to assess and evaluate symptoms and progress in treatment, Medication management, and Plan continue current medications. ? ? , DO ?10/25/2021, 10:34 AM ? ?

## 2021-10-25 NOTE — [facility] Group Notes (Incomplete)
Group: chair yoga  ?Pt was present,participating,calm, composed ?

## 2021-10-26 NOTE — [facility] Group Notes (Signed)
Group: outdoor patio , corn hole, music, socializing. Pt  participated ?

## 2021-10-26 NOTE — [facility] Group Notes (Signed)
Morning Group - chair yoga - pt participated  ?

## 2021-10-26 NOTE — Progress Notes (Signed)
[facility] ? ?10/26/2021 11:07 AM ?Sonia SideIsis [name]  ?MRN:  [id number] ?Subjective: [name] states that she is doing fine.  She has no complaints.  She denies any side effects from her medicines.  Nurses state that she is eating and drinking and sleeping.  No issues. ? ?Principal Problem: Schizophrenia, undifferentiated (HCC) ?Diagnosis: Principal Problem: ?  Schizophrenia, undifferentiated (HCC) ? ?Total Time spent with patient: 15 minutes ? ?Past Psychiatric History: Schizophrenia ? ?Past Medical History:  ?Past Medical History:  ?Diagnosis Date  ? Anemia 10/02/2021  ? Non compliance w medication regimen   ? Schizophrenia (HCC)   ?  ?Past Surgical History:  ?Procedure Laterality Date  ? BIOPSY  09/25/2021  ? Procedure: BIOPSY;  Surgeon: Rachael FeeJacobs, Daniel P, MD;  [facility];  Service: Gastroenterology;;  ? ESOPHAGOGASTRODUODENOSCOPY (EGD) WITH PROPOFOL [name] 09/25/2021  ? Procedure: ESOPHAGOGASTRODUODENOSCOPY (EGD) WITH PROPOFOL;  Surgeon: Rachael FeeJacobs, Daniel P, MD;  Location: [facility];  Service: Gastroenterology;  Laterality: [name];  With NGT placement  ? ?Family History: History reviewed. No pertinent family history. ? ?Social History:  ?Social History  ? ?Substance and Sexual Activity  ?Alcohol Use Not Currently  ? Comment: Refuses to disclose how much  ?   ?Social History  ? ?Substance and Sexual Activity  ?Drug Use No  ? Comment: Refuses to answer  ?  ?Social History  ? ?Socioeconomic History  ? Marital status: Single  ?  Spouse name: Not on file  ? Number of children: Not on file  ? Years of education: Not on file  ? Highest education level: Not on file  ?Occupational History  ? Not on file  ?Tobacco Use  ? Smoking status: Some Days  ?  Packs/day: 0.50  ?  Types: Cigarettes  ? Smokeless tobacco: Never  ?Vaping Use  ? Vaping Use: Every day  ?Substance and Sexual Activity  ? Alcohol use: Not Currently  ?  Comment: Refuses to disclose how much  ? Drug use: No  ?  Comment: Refuses to answer  ? Sexual activity:  Never  ?  Comment: refused to answer  ?Other Topics Concern  ? Not on file  ?Social History Narrative  ? Not on file  ? ?Social Determinants of Health  ? ?Financial Resource Strain: Not on file  ?Food Insecurity: Not on file  ?Transportation Needs: Not on file  ?Physical Activity: Not on file  ?Stress: Not on file  ?Social Connections: Not on file  ? ?Additional Social History:  ?  ?  ?  ?  ?  ?  ?  ?  ?  ?  ?  ? ?Sleep: Good ? ?Appetite:  Good ? ?Current Medications: ?Current Facility-Administered Medications  ?Medication Dose Route Frequency Provider Last Rate Last Admin  ? (feeding supplement) PROSource Plus liquid 30 mL  30 mL Oral BID BM [name], [name], MD   30 mL at 10/26/21 0915  ? acetaminophen (TYLENOL) tablet 650 mg  650 mg Oral Q6H PRN [name], Jackquline DenmarkJohn T, MD      ? alum & mag hydroxide-simeth (MAALOX/MYLANTA) 200-200-20 MG/5ML suspension 30 mL  30 mL Oral Q4H PRN [name], [name], MD      ? benztropine (COGENTIN) tablet 0.5 mg  0.5 mg Oral BID PRN [name], Jackquline DenmarkJohn T, MD      ? clonazePAM Scarlette Calico(KLONOPIN) disintegrating tablet 0.25 mg  0.25 mg Oral BID [name], [name], MD   0.25 mg at 10/26/21 0914  ? haloperidol (HALDOL) tablet 5 mg  5  mg Oral BID [name], [name], MD   5 mg at 10/26/21 7106  ? magnesium hydroxide (MILK OF MAGNESIA) suspension 30 mL  30 mL Oral Daily PRN [name], [name], MD      ? menthol-cetylpyridinium (CEPACOL) lozenge 3 mg  1 lozenge Oral PRN [name], [name], MD      ? midodrine (PROAMATINE) tablet 2.5 mg  2.5 mg Oral TID WC [name], [name], MD   2.5 mg at 10/26/21 0729  ? mirtazapine (REMERON SOL-TAB) disintegrating tablet 7.5 mg  7.5 mg Oral QHS [name], [name], MD   7.5 mg at 10/25/21 2108  ? multivitamin with minerals tablet 1 tablet  1 tablet Oral Daily [name], [name], MD   1 tablet at 10/26/21 0914  ? OLANZapine zydis (ZYPREXA) disintegrating tablet 5 mg  5 mg Oral QHS [name], DO   5 mg at 10/25/21 2109  ? thiamine tablet 100 mg  100 mg Oral Daily [name], [name], MD   100  mg at 10/26/21 0914  ? traZODone (DESYREL) tablet 50 mg  50 mg Oral QHS PRN [name], [name], MD      ? ? ?Lab Results: No results found for this or any previous visit (from the past 48 hour(s)). ? ?Blood Alcohol level:  ?Lab Results  ?Component Value Date  ? ETH <10 09/12/2021  ? ETH <10 10/01/2020  ? ? ?Metabolic Disorder Labs: ?Lab Results  ?Component Value Date  ? HGBA1C 5.6 10/21/2021  ? MPG 114 10/21/2021  ? MPG 111 10/02/2021  ? ?Lab Results  ?Component Value Date  ? PROLACTIN 13.6 04/16/2015  ? ?Lab Results  ?Component Value Date  ? CHOL 189 10/21/2021  ? TRIG 52 10/21/2021  ? HDL 50 10/21/2021  ? CHOLHDL 3.8 10/21/2021  ? VLDL 10 10/21/2021  ? LDLCALC 129 (H) 10/21/2021  ? LDLCALC 93 10/01/2020  ? ? ?Physical Findings: ?AIMS:  , ,  ,  ,    ?CIWA:    ?COWS:    ? ?Musculoskeletal: ?Strength & Muscle Tone: within normal limits ?Gait & Station: normal ?Patient leans: [name] ? ?Psychiatric Specialty Exam: ? ?Presentation  ?General Appearance: Casual; Appropriate for Environment; Fairly Groomed ? ?Eye Contact:Fair ? ?Speech:Slow ? ?Speech Volume:Decreased ? ?Handedness:Right ? ? ?Mood and Affect  ?Mood:Anxious; Depressed ? ?Affect:Blunt ? ? ?Thought Process  ?Thought Processes:Disorganized ? ?Descriptions of Associations:Loose ? ?Orientation:Partial ? ?Thought Content:Delusions; Illogical; Paranoid Ideation ? ?History of Schizophrenia/Schizoaffective disorder:Yes ? ?Duration of Psychotic Symptoms:Greater than six months ? ?Hallucinations:No data recorded ?Ideas of Reference:Paranoia ? ?Suicidal Thoughts:No data recorded ?Homicidal Thoughts:No data recorded ? ?Sensorium  ?Memory:Recent Poor; Remote Poor ? ?Judgment:Impaired ? ?Insight:Fair ? ? ?Executive Functions  ?Concentration:Poor ? ?Attention Span:Poor ? ?Recall:Poor ? ?Fund of Knowledge:Poor ? ?Language:Fair ? ? ?Psychomotor Activity  ?Psychomotor Activity:No data recorded ? ?Assets  ?Assets:Communication Skills; Resilience; Financial Resources/Insurance; Desire  for Improvement; Physical Health; Social Support ? ? ?Sleep  ?Sleep:No data recorded ? ? ?Physical Exam: ?Physical Exam ?Vitals and nursing note reviewed.  ?Constitutional:   ?   Appearance: Normal appearance. She is normal weight.  ?Neurological:  ?   General: No focal deficit present.  ?   Mental Status: She is alert and oriented to person, place, and time.  ?Psychiatric:     ?   Mood and Affect: Mood normal.     ?   Behavior: Behavior normal.  ? ?Review of Systems  ?Constitutional: Negative.   ?HENT: Negative.    ?Eyes: Negative.   ?  Respiratory: Negative.    ?Cardiovascular: Negative.   ?Gastrointestinal: Negative.   ?Genitourinary: Negative.   ?Musculoskeletal: Negative.   ?Skin: Negative.   ?Neurological: Negative.   ?Endo/Heme/Allergies: Negative.   ?Psychiatric/Behavioral: Negative.    ?Blood pressure (!) 96/55, pulse (!) 50, temperature 97.9 ?F (36.6 ?C), temperature source Oral, resp. rate 18, height 5\' 4"  (1.626 m), weight 44.5 kg, SpO2 98 %. Body mass index is 16.82 kg/m?. ? ? ?Treatment Plan Summary: ?Daily contact with patient to assess and evaluate symptoms and progress in treatment, Medication management, and Plan continue current medications. ? ? , DO ?10/26/2021, 11:07 AM ? ?

## 2021-10-26 NOTE — Group Note (Addendum)
[facility] LCSW Group Therapy Note ? ? ?Group Date: 10/26/2021 ?Start Time: 1310 ?End Time: 1410 ? ? ?Type of Therapy/Topic:  Group Therapy:  Emotion Regulation ? ?Participation Level:  Minimal  ? ? ?Description of Group:   ? The purpose of this group is to assist patients in learning to regulate negative emotions and experience positive emotions. Patients will be guided to discuss ways in which they have been vulnerable to their negative emotions. These vulnerabilities will be juxtaposed with experiences of positive emotions or situations, and patients challenged to use positive emotions to combat negative ones. Special emphasis will be placed on coping with negative emotions in conflict situations, and patients will process healthy conflict resolution skills. ? ?Therapeutic Goals: ?Patient will identify two positive emotions or experiences to reflect on in order to balance out negative emotions:  ?Patient will label two or more emotions that they find the most difficult to experience:  ?Patient will be able to demonstrate positive conflict resolution skills through discussion or role plays:  ? ?Summary of Patient Progress: Patient was present for the entirety of the group session. Patient participated in the icebreaker activity where she reflected on a positive emotional experience. Patient did not contribute to group discussion; however, she appeared to actively listen to other group members and was observed reading the supplemental group handouts.  ? ? ? ? ? ? ?Therapeutic Modalities:   ?Cognitive Behavioral Therapy ?Feelings Identification ?Dialectical Behavioral Therapy ? ? ?[name], LCSWA ?

## 2021-10-26 NOTE — Progress Notes (Signed)
The patient has been calm and cooperative during the shift. She has slept during the entire shift. She was med compliant. Denies SI/HI AVH. ? ?Angelina Sheriff, RN ? ?

## 2021-10-26 NOTE — Progress Notes (Signed)
Patient is calm and cooperative with assessment. She denies SI, HI, and AVH. She also denies pain and other physical problems. Patient states that she just wants to be discharged. Patient ate breakfast and returned to her room. She came back to the dayroom and participated in group yoga. Patient is compliant with scheduled medications. She interacts minimally with others. Patient remains safe on the unit at this time. ?

## 2021-10-27 NOTE — Progress Notes (Signed)
Patient is calm and cooperative with assessment. She denies SI, HI, and AVH. Patient interacts minimally with others. She ate breakfast and came to group yoga. Patient is compliant with scheduled medications. Patient remains safe on the unit at this time. ?

## 2021-10-27 NOTE — Progress Notes (Signed)
[name] ? ?10/27/2021 1:54 PM ?[name]  ?MRN:  [id number] ?Subjective: Patient is seen on rounds.  [name] tells me that [name] is doing fine.  [name] has been compliant with her medications.  [name] denies any side effects.  [name] is looking forward to going home.  [name] has been eating and drinking. ? ?Principal Problem: Schizophrenia, undifferentiated (HCC) ?Diagnosis: Principal Problem: ?  Schizophrenia, undifferentiated (HCC) ? ?Total Time spent with patient: 15 minutes ? ?Past Psychiatric History:  [name] was hospitalized at [name] in March 2022 and September 2021.  Patient has had multiple psychiatric hospitalizations in the past.  [name] secondary to similar circumstances.  [name] had been hospitalized apparently at an outside facility [name]([place] for 4 weeks [name]  [name] ? ?Past Medical History:  ?Past Medical History:  ?Diagnosis Date  ? Anemia 10/02/2021  ? Non compliance w medication regimen   ? Schizophrenia (HCC)   ?  ?Past Surgical History:  ?Procedure Laterality Date  ? BIOPSY  09/25/2021  ? Procedure: BIOPSY;  Surgeon: Paula FeeJacobs, Daniel P, MD;  Location: [name];  Service: Gastroenterology;;  ? ESOPHAGOGASTRODUODENOSCOPY (EGD) WITH PROPOFOL [name] 09/25/2021  ? Procedure: ESOPHAGOGASTRODUODENOSCOPY (EGD) WITH PROPOFOL;  Surgeon: Paula FeeJacobs, Daniel P, MD;  [facility]: [name];  Service: Gastroenterology;  Laterality: [name];  With NGT placement  ? ?Family History: History reviewed. No pertinent family history. ? ?Social History:  ?Social History  ? ?Substance and Sexual Activity  ?Alcohol Use Not Currently  ? Comment: Refuses to disclose how much  ?   ?Social History  ? ?Substance and Sexual Activity  ?Drug Use No  ? Comment: Refuses to answer  ?  ?Social History  ? ?Socioeconomic History  ? Marital status: Single  ?  Spouse name: Not on file  ? Number of children: Not  on file  ? Years of education: Not on file  ? Highest education level: Not on file  ?Occupational History  ? Not on file  ?Tobacco Use  ? Smoking status: Some Days  ?  Packs/day: 0.50  ?  Types: Cigarettes  ? Smokeless tobacco: Never  ?Vaping Use  ? Vaping Use: Every day  ?Substance and Sexual Activity  ? Alcohol use: Not Currently  ?  Comment: Refuses to disclose how much  ? Drug use: No  ?  Comment: Refuses to answer  ? Sexual activity: Never  ?  Comment: refused to answer  ?Other Topics Concern  ? Not on file  ?Social History Narrative  ? Not on file  ? ?Social Determinants of Health  ? ?Financial Resource Strain: Not on file  ?Food Insecurity: Not on file  ?Transportation Needs: Not on file  ?Physical Activity: Not on file  ?Stress: Not on file  ?Social Connections: Not on file  ? ?Additional Social History:  ?  ?  ?  ?  ?  ?  ?  ?  ?  ?  ?  ? ?Sleep: Good ? ?Appetite:  Good ? ?Current Medications: ?Current Facility-Administered Medications  ?Medication Dose Route Frequency Provider Last Rate Last Admin  ? (feeding supplement) PROSource Plus liquid 30 mL  30 mL Oral BID BM [name], [name], MD   30 mL at 10/27/21 1307  ? acetaminophen (TYLENOL) tablet 650 mg  650 mg Oral Q6H PRN [name], Jackquline DenmarkJohn T, MD      ? alum & mag hydroxide-simeth (  MAALOX/MYLANTA) 200-200-20 MG/5ML suspension 30 mL  30 mL Oral Q4H PRN [name], [name], MD      ? benztropine (COGENTIN) tablet 0.5 mg  0.5 mg Oral BID PRN [name], [name], MD      ? clonazePAM [name]) disintegrating tablet 0.25 mg  0.25 mg Oral BID [name], [name], MD   0.25 mg at 10/27/21 7673  ? haloperidol (HALDOL) tablet 5 mg  5 mg Oral BID [name], [name], MD   5 mg at 10/27/21 4193  ? magnesium hydroxide (MILK OF MAGNESIA) suspension 30 mL  30 mL Oral Daily PRN [name], [name], MD      ? menthol-cetylpyridinium (CEPACOL) lozenge 3 mg  1 lozenge Oral PRN [name], [name], MD      ? midodrine (PROAMATINE) tablet 2.5 mg  2.5 mg Oral TID WC [name], [name], MD   2.5 mg at  10/27/21 1144  ? mirtazapine (REMERON SOL-TAB) disintegrating tablet 7.5 mg  7.5 mg Oral QHS [name], [name], MD   7.5 mg at 10/26/21 2120  ? multivitamin with minerals tablet 1 tablet  1 tablet Oral Daily [name], [name], MD   1 tablet at 10/27/21 7902  ? OLANZapine zydis (ZYPREXA) disintegrating tablet 5 mg  5 mg Oral QHS Paula Ill, DO   5 mg at 10/26/21 2120  ? thiamine tablet 100 mg  100 mg Oral Daily [name], [name], MD   100 mg at 10/27/21 4097  ? traZODone (DESYREL) tablet 50 mg  50 mg Oral QHS PRN [name], [name], MD      ? ? ?Lab Results: No results found for this or any previous visit (from the past 48 hour(s)). ? ?Blood Alcohol level:  ?Lab Results  ?Component Value Date  ? ETH <10 09/12/2021  ? ETH <10 10/01/2020  ? ? ?Metabolic Disorder Labs: ?Lab Results  ?Component Value Date  ? HGBA1C 5.6 10/21/2021  ? MPG 114 10/21/2021  ? MPG 111 10/02/2021  ? ?Lab Results  ?Component Value Date  ? PROLACTIN 13.6 04/16/2015  ? ?Lab Results  ?Component Value Date  ? CHOL 189 10/21/2021  ? TRIG 52 10/21/2021  ? HDL 50 10/21/2021  ? CHOLHDL 3.8 10/21/2021  ? VLDL 10 10/21/2021  ? LDLCALC 129 (H) 10/21/2021  ? LDLCALC 93 10/01/2020  ? ? ?Physical Findings: ?AIMS:  , ,  ,  ,    ?CIWA:    ?COWS:    ? ?Musculoskeletal: ?Strength & Muscle Tone: within normal limits ?Gait & Station: normal ?Patient leans: [name] ? ?Psychiatric Specialty Exam: ? ?Presentation  ?General Appearance: Casual; Appropriate for Environment; Fairly Groomed ? ?Eye Contact:Fair ? ?Speech:Slow ? ?Speech Volume:Decreased ? ?Handedness:Right ? ? ?Mood and Affect  ?Mood:Anxious; Depressed ? ?Affect:Blunt ? ? ?Thought Process  ?Thought Processes:Disorganized ? ?Descriptions of Associations:Loose ? ?Orientation:Partial ? ?Thought Content:Delusions; Illogical; Paranoid Ideation ? ?History of Schizophrenia/Schizoaffective disorder:Yes ? ?Duration of Psychotic Symptoms:Greater than six months ? ?Hallucinations:No data recorded ?Ideas of  Reference:Paranoia ? ?Suicidal Thoughts:No data recorded ?Homicidal Thoughts:No data recorded ? ?Sensorium  ?Memory:Recent Poor; Remote Poor ? ?Judgment:Impaired ? ?Insight:Fair ? ? ?Executive Functions  ?Concentration:Poor ? ?Attention Span:Poor ? ?Recall:Poor ? ?Fund of Knowledge:Poor ? ?Language:Fair ? ? ?Psychomotor Activity  ?Psychomotor Activity:No data recorded ? ?Assets  ?Assets:Communication Skills; Resilience; Financial Resources/Insurance; Desire for Improvement; Physical Health; Social Support ? ? ?Sleep  ?Sleep:No data recorded ? ? ?Physical Exam: ?Physical Exam ?Vitals and nursing note reviewed.  ?Constitutional:   ?   Appearance:  Normal appearance. [name] is normal weight.  ?Neurological:  ?   General: No focal deficit present.  ?   Mental Status: [name] is alert and oriented to person, place, and time.  ?Psychiatric:     ?   Attention and Perception: Attention and perception normal.     ?   Mood and Affect: Mood and affect normal.     ?   Speech: Speech normal.     ?   Behavior: Behavior normal. Behavior is cooperative.     ?   Thought Content: Thought content is paranoid.     ?   Cognition and Memory: Cognition and memory normal.     ?   Judgment: Judgment normal.  ? ?Review of Systems  ?Constitutional: Negative.   ?HENT: Negative.    ?Eyes: Negative.   ?Respiratory: Negative.    ?Cardiovascular: Negative.   ?Gastrointestinal: Negative.   ?Genitourinary: Negative.   ?Musculoskeletal: Negative.   ?Skin: Negative.   ?Neurological: Negative.   ?Endo/Heme/Allergies: Negative.   ?Psychiatric/Behavioral: Negative.    ?Blood pressure (!) 88/61, pulse (!) 51, temperature 98 ?F (36.7 ?C), temperature source Oral, resp. rate 18, height 5\' 4"  (1.626 m), weight 44.5 kg, SpO2 100 %. Body mass index is 16.82 kg/m?. ? ? ?Treatment Plan Summary: ?Daily contact with patient to assess and evaluate symptoms and progress in treatment, Medication management, and Plan continue current medications. ? ? ,  DO ?10/27/2021, 1:54 PM ? ?

## 2021-10-27 NOTE — [facility] Group Notes (Signed)
Group: chair yoga - Pt aprticipated , friendly , calm , composed  ? ?

## 2021-10-27 NOTE — Progress Notes (Signed)
Patient presents A&O x 4. Patient affect is calm and cooperative, behavior withdrawn and isolative.  Denies AVH, SI, HI, depression, anxiety or pain.  VSS.  Patient compliant with all scheduled meds.  Patient spent entire shift in her room. Ongoing Q15 minute safety check rounds per unit protocol.  ?

## 2021-10-27 NOTE — Progress Notes (Signed)
Patient presents A&O x 4. Patient affect is calm and cooperative, behavior withdrawn and isolative.  Denies AVH, SI, HI, depression, anxiety or pain.  VSS.  Patient compliant with all scheduled meds.  Patient spent entire shift in her room. Ongoing Q15 minute safety check rounds per unit protocol.  ?

## 2021-10-27 NOTE — Plan of Care (Signed)
  Problem: Group Participation Goal: STG - Patient will engage in groups without prompting or encouragement from LRT x3 group sessions within 5 recreation therapy group sessions Description: STG - Patient will engage in groups without prompting or encouragement from LRT x3 group sessions within 5 recreation therapy group sessions Outcome: Not Progressing   

## 2021-10-27 NOTE — Progress Notes (Signed)
Recreation Therapy Notes ? ?Date: 10/27/2021 ?  ?Time: 2:00 pm  ?  ?Location:  Court yard ?  ?Behavioral response: [name] ?  ?Intervention Topic: Strengths    ?  ?Discussion/Intervention: ?Patient refused to attend group. ?  ?Clinical Observations/Feedback:  ?Patient refused to attend group. ?  ?Malaki Koury LRT/CTRS ? ? ? ? ? ? ? ?Quenisha Lovins ?10/27/2021 2:19 PM ?

## 2021-10-27 NOTE — Group Note (Signed)
[facility] LCSW Group Therapy Note ? ? ? ?Group Date: 10/27/2021 ?Start Time: 1400 ?End Time: 1405 ? ?Type of Therapy and Topic:  Group Therapy:  Overcoming Obstacles ? ?Participation Level:  [facility] PARTICIPATION LEVEL: Did Not Attend ? ? ?Description of Group:   ?In this group patients will be encouraged to explore what they see as obstacles to their own wellness and recovery. They will be guided to discuss their thoughts, feelings, and behaviors related to these obstacles. The group will process together ways to cope with barriers, with attention given to specific choices patients can make. Each patient will be challenged to identify changes they are motivated to make in order to overcome their obstacles. This group will be process-oriented, with patients participating in exploration of their own experiences as well as giving and receiving support and challenge from other group members. ? ?Therapeutic Goals: ?1. Patient will identify personal and current obstacles as they relate to admission. ?2. Patient will identify barriers that currently interfere with their wellness or overcoming obstacles.  ?3. Patient will identify feelings, thought process and behaviors related to these barriers. ?4. Patient will identify two changes they are willing to make to overcome these obstacles:  ? ? ?Summary of Patient Progress ? ? ?X ? ? ?Therapeutic Modalities:   ?Cognitive Behavioral Therapy ?Solution Focused Therapy ?Motivational Interviewing ?Relapse Prevention Therapy ? ? ?Manmeet Arzola A [place], LCSWA ?

## 2021-10-28 DIAGNOSIS — F203 Undifferentiated schizophrenia: Principal | ICD-10-CM

## 2021-10-28 NOTE — Group Note (Signed)
LCSW Group Therapy Note ? ?Group Date: 10/28/2021 ?Start Time: 1345 ?End Time: 1420 ? ? ?Type of Therapy and Topic:  Group Therapy - Healthy vs Unhealthy Coping Skills ? ?Participation Level:  Minimal  ? ?Description of Group ?The focus of this group was to determine what unhealthy coping techniques typically are used by group members and what healthy coping techniques would be helpful in coping with various problems. Patients were guided in becoming aware of the differences between healthy and unhealthy coping techniques. Patients were asked to identify 2-3 healthy coping skills they would like to learn to use more effectively. ? ?Therapeutic Goals ?Patients learned that coping is what human beings do all day long to deal with various situations in their lives ?Patients defined and discussed healthy vs unhealthy coping techniques ?Patients identified their preferred coping techniques and identified whether these were healthy or unhealthy ?Patients determined 2-3 healthy coping skills they would like to become more familiar with and use more often. ?Patients provided support and ideas to each other ? ? ?Summary of Patient Progress:  Patient was present for the entirety of the group session. Patient was somewhat agitated during session when she discussed her family but was mainly appropriate. CSW facilitated group in the courtyard. She stated that she felt ready to go home and was feeling better.  ? ? ?Therapeutic Modalities ?Cognitive Behavioral Therapy ?Motivational Interviewing ? ?Marquavis Hannen A [place], LCSWA ?10/28/2021  5:56 PM   ?

## 2021-10-28 NOTE — Progress Notes (Signed)
[facility] MD Progress Note ? ?10/28/2021 2:07 PM ?[name]  ?MRN:  [id number] ?Subjective: Patient is seen on rounds.  She has been compliant with her medications.  She has been appropriate and interacting with staff and peers.  She denies any [name] effects from her medication.  She denies SI or auditory hallucinations.  I spoke with social work about discharge soon. ? ?Principal Problem: Schizophrenia, undifferentiated (HCC) ?Diagnosis: Principal Problem: ?  Schizophrenia, undifferentiated (HCC) ? ?Total Time spent with patient: 15 minutes ? ?Past Psychiatric History:  She was hospitalized at [facility] in March 2022 and September 2021.  Patient has had multiple psychiatric hospitalizations in the past.  She was at [facility] hospital in 2020 secondary to similar circumstances.  She had been hospitalized apparently at an outside facility [facility]) for 4 weeks prior to being transferred to [facility].  [facility]patient at the [facility] ?  ? ?Past Medical History:  ?Past Medical History:  ?Diagnosis Date  ? Anemia 10/02/2021  ? Non compliance w medication regimen   ? Schizophrenia (HCC)   ?  ?Past Surgical History:  ?Procedure Laterality Date  ? BIOPSY  09/25/2021  ? Procedure: BIOPSY;  Surgeon: Rachael Fee, MD;  Location: [facility];  Service: Gastroenterology;;  ? ESOPHAGOGASTRODUODENOSCOPY (EGD) WITH PROPOFOL [name] 09/25/2021  ? Procedure: ESOPHAGOGASTRODUODENOSCOPY (EGD) WITH PROPOFOL;  Surgeon: Rachael Fee, MD;  Location: [facility];  Service: Gastroenterology;  Laterality: [name];  With NGT placement  ? ?Family History: History reviewed. No pertinent family history. ? ?Social History:  ?Social History  ? ?Substance and Sexual Activity  ?Alcohol Use Not Currently  ? Comment: Refuses to disclose how much  ?   ?Social History  ? ?Substance and Sexual Activity  ?Drug Use No  ? Comment: Refuses to answer  ?  ?Social History  ? ?Socioeconomic History  ? Marital  status: Single  ?  Spouse name: Not on file  ? Number of children: Not on file  ? Years of education: Not on file  ? Highest education level: Not on file  ?Occupational History  ? Not on file  ?Tobacco Use  ? Smoking status: Some Days  ?  Packs/day: 0.50  ?  Types: Cigarettes  ? Smokeless tobacco: Never  ?Vaping Use  ? Vaping Use: Every day  ?Substance and Sexual Activity  ? Alcohol use: Not Currently  ?  Comment: Refuses to disclose how much  ? Drug use: No  ?  Comment: Refuses to answer  ? Sexual activity: Never  ?  Comment: refused to answer  ?Other Topics Concern  ? Not on file  ?Social History Narrative  ? Not on file  ? ?Social Determinants of Health  ? ?Financial Resource Strain: Not on file  ?Food Insecurity: Not on file  ?Transportation Needs: Not on file  ?Physical Activity: Not on file  ?Stress: Not on file  ?Social Connections: Not on file  ? ?Additional Social History:  ?  ?  ?  ?  ?  ?  ?  ?  ?  ?  ?  ? ?Sleep: Good ? ?Appetite:  Good ? ?Current Medications: ?Current Facility-Administered Medications  ?Medication Dose Route Frequency Provider Last Rate Last Admin  ? (feeding supplement) PROSource Plus liquid 30 mL  30 mL Oral BID BM [name], [name], MD   30 mL at 10/28/21 0919  ? acetaminophen (TYLENOL) tablet 650 mg  650 mg Oral Q6H PRN [name], [name], MD      ?  alum & mag hydroxide-simeth (MAALOX/MYLANTA) 200-200-20 MG/5ML suspension 30 mL  30 mL Oral Q4H PRN [name], [name], MD      ? benztropine (COGENTIN) tablet 0.5 mg  0.5 mg Oral BID PRN [name], [name], MD      ? clonazePAM Scarlette Calico) disintegrating tablet 0.25 mg  0.25 mg Oral BID [name], [name], MD   0.25 mg at 10/28/21 6010  ? haloperidol (HALDOL) tablet 5 mg  5 mg Oral BID [name], [name], MD   5 mg at 10/28/21 9323  ? magnesium hydroxide (MILK OF MAGNESIA) suspension 30 mL  30 mL Oral Daily PRN [name], [name], MD      ? menthol-cetylpyridinium (CEPACOL) lozenge 3 mg  1 lozenge Oral PRN [name], [name], MD      ? midodrine (PROAMATINE)  tablet 2.5 mg  2.5 mg Oral TID WC [name], [name], MD   2.5 mg at 10/28/21 1236  ? mirtazapine (REMERON SOL-TAB) disintegrating tablet 7.5 mg  7.5 mg Oral QHS [name], [name], MD   7.5 mg at 10/27/21 2158  ? multivitamin with minerals tablet 1 tablet  1 tablet Oral Daily [name], [name], MD   1 tablet at 10/28/21 0917  ? OLANZapine zydis (ZYPREXA) disintegrating tablet 5 mg  5 mg Oral QHS [name], DO   5 mg at 10/27/21 2117  ? thiamine tablet 100 mg  100 mg Oral Daily [name], [name], MD   100 mg at 10/28/21 5573  ? traZODone (DESYREL) tablet 50 mg  50 mg Oral QHS PRN [name], [name], MD      ? ? ?Lab Results: No results found for this or any previous visit (from the past 48 hour(s)). ? ?Blood Alcohol level:  ?Lab Results  ?Component Value Date  ? ETH <10 09/12/2021  ? ETH <10 10/01/2020  ? ? ?Metabolic Disorder Labs: ?Lab Results  ?Component Value Date  ? HGBA1C 5.6 10/21/2021  ? MPG 114 10/21/2021  ? MPG 111 10/02/2021  ? ?Lab Results  ?Component Value Date  ? PROLACTIN 13.6 04/16/2015  ? ?Lab Results  ?Component Value Date  ? CHOL 189 10/21/2021  ? TRIG 52 10/21/2021  ? HDL 50 10/21/2021  ? CHOLHDL 3.8 10/21/2021  ? VLDL 10 10/21/2021  ? LDLCALC 129 (H) 10/21/2021  ? LDLCALC 93 10/01/2020  ? ? ?Physical Findings: ?AIMS:  , ,  ,  ,    ?CIWA:    ?COWS:    ? ?Musculoskeletal: ?Strength & Muscle Tone: within normal limits ?Gait & Station: normal ?Patient leans: [name] ? ?Psychiatric Specialty Exam: ? ?Presentation  ?General Appearance: Casual; Appropriate for Environment; Fairly Groomed ? ?Eye Contact:Fair ? ?Speech:Slow ? ?Speech Volume:Decreased ? ?Handedness:Right ? ? ?Mood and Affect  ?Mood:Anxious; Depressed ? ?Affect:Blunt ? ? ?Thought Process  ?Thought Processes:Disorganized ? ?Descriptions of Associations:Loose ? ?Orientation:Partial ? ?Thought Content:Delusions; Illogical; Paranoid Ideation ? ?History of Schizophrenia/Schizoaffective disorder:Yes ? ?Duration of Psychotic Symptoms:Greater than  six months ? ?Hallucinations:No data recorded ?Ideas of Reference:Paranoia ? ?Suicidal Thoughts:No data recorded ?Homicidal Thoughts:No data recorded ? ?Sensorium  ?Memory:Recent Poor; Remote Poor ? ?Judgment:Impaired ? ?Insight:Fair ? ? ?Executive Functions  ?Concentration:Poor ? ?Attention Span:Poor ? ?Recall:Poor ? ?Fund of Knowledge:Poor ? ?Language:Fair ? ? ?Psychomotor Activity  ?Psychomotor Activity:No data recorded ? ?Assets  ?Assets:Communication Skills; Resilience; Financial Resources/Insurance; Desire for Improvement; Physical Health; Social Support ? ? ?Sleep  ?Sleep:No data recorded ? ? ?Physical Exam: ?Physical Exam ?Vitals and nursing note reviewed.  ?Constitutional:   ?  Appearance: Normal appearance. She is normal weight.  ?Neurological:  ?   General: No focal deficit present.  ?   Mental Status: She is alert and oriented to person, place, and time.  ?Psychiatric:     ?   Attention and Perception: Attention and perception normal.     ?   Mood and Affect: Mood and affect normal.     ?   Speech: Speech normal.     ?   Behavior: Behavior normal. Behavior is cooperative.     ?   Thought Content: Thought content normal.     ?   Cognition and Memory: Cognition and memory normal.     ?   Judgment: Judgment normal.  ? ?Review of Systems  ?Constitutional: Negative.   ?HENT: Negative.    ?Eyes: Negative.   ?Respiratory: Negative.    ?Cardiovascular: Negative.   ?Gastrointestinal: Negative.   ?Genitourinary: Negative.   ?Musculoskeletal: Negative.   ?Skin: Negative.   ?Neurological: Negative.   ?Endo/Heme/Allergies: Negative.   ?Psychiatric/Behavioral: Negative.    ?Blood pressure (!) 101/58, pulse (!) 54, temperature 98.1 ?F (36.7 ?C), temperature source Oral, resp. rate 18, height 5\' 4"  (1.626 m), weight 44.5 kg, SpO2 100 %. Body mass index is 16.82 kg/m?. ? ? ?Treatment Plan Summary: ?Daily contact with patient to assess and evaluate symptoms and progress in treatment, Medication management, and Plan  continue current medications. ? ?Sarina Illichard Edward Nijah Tejera, DO ?10/28/2021, 2:07 PM ? ?

## 2021-10-28 NOTE — Progress Notes (Signed)
Recreation Therapy Notes ? ?Date: 10/28/2021 ?  ?Time: 1:45 pm  ? ?Location: Courtyard  ?  ?Behavioral response: Appropriate ?  ?Intervention Topic: Social-Skills   ?  ?Discussion/Intervention:  ?Group content on today was focused on social skills. The group defined social skills and identified ways they use social skills. Patients expressed what obstacles they face when trying to be social. Participants described the importance of social skills. The group listed ways to improve social skills and reasons to improve social skills. Individuals had an opportunity to learn new and improve social skills as well as identify their weaknesses. ?Clinical Observations/Feedback: ?Patient came to group and identified many ways to socialize with others. Participant shared experiences they have had in the past when socializing with others. Individual was social with peers and staff while participating in the intervention.    ?Douglass Dunshee LRT/CTRS  ?  ?  ? ? ? ? ? ? ? ?Jodie Cavey ?10/28/2021 3:44 PM ?

## 2021-10-28 NOTE — Progress Notes (Signed)
Pt in room sitting in chair at this hour. No apparent distress. RR even and unlabored. Pt denies SI, HI, and pain. Pt endorses AH but says the voices are "okay." Pt states that she is upset at the person who set her up and put her in the hospital. "I just want to go home. I'm patient but I want to know when I can go." Pt says she lives in [place] with her great grandmother. Believes her visitor tonight was not the "real" person she said she was. Pt is A&O x4. Pt is pleasant but paranoid. VSS. Monitored for safety.  ?

## 2021-10-28 NOTE — [facility] Group Notes (Signed)
[facility] Group Notes:  (Nursing/MHT/Case Management/Adjunct) ? ?Date:  10/28/2021  ?Time:  9:30 AM ? ?Type of Therapy:  Psychoeducational Skills ? ?Participation Level:  Minimal ? ?Participation Quality:  Appropriate ? ?Affect:  Appropriate ? ?Cognitive:  Appropriate ? ?Insight:  Appropriate ? ?Engagement in Group:  Engaged ? ?Modes of Intervention:   Discussion ? ?Summary of Progress/Problems: ?The pt. attended group but shared very little during discussion. The pt had to be called on and asked to speak before sharing anything. ?Barbaraann Rondo ?10/28/2021, 12:09 PM ?

## 2021-10-29 NOTE — Progress Notes (Addendum)
Pt is in her room at this hour. No apparent distress. RR even and unlabored. Pt denies SI, HI, AVH and pain. Pt denies anxiety and depression. Pt says she is ready for discharge but hasn't spoken with a provider and doesn't know when they will let her leave. Pt has been minimal and isolative today per report. BP low this evening. Will encourage pt to increase fluids. Pt rates her day 8/10. Says her appetite is okay and endorses good sleep. Monitored for safety.  ?

## 2021-10-29 NOTE — Progress Notes (Signed)
[facility] MD Progress Note ? ?10/29/2021 12:08 PM ?[name]  ?MRN:  [id number] ?Subjective:  [name] denies any signs or symptoms.  She has been compliant with her medication.  She is sleeping well and eating well.  She is pleasant and cooperative but paranoid at times.  Social work is trying to get a hold of the family for discharge plans. ? ?Principal Problem: Schizophrenia, undifferentiated (HCC) ?Diagnosis: Principal Problem: ?  Schizophrenia, undifferentiated (HCC) ? ?Total Time spent with patient: 15 minutes ? ?Past Psychiatric History: She was hospitalized at [facility] in March 2022 and September 2021.  Patient has had multiple psychiatric hospitalizations in the past.  She was at [facility] hospital in 2020 secondary to similar circumstances.  She had been hospitalized apparently at an outside facility [facility]) for 4 weeks prior to being transferred to [facility].  Outpatient at the [facility]. ? ?Past Medical History:  ?Past Medical History:  ?Diagnosis Date  ? Anemia 10/02/2021  ? Non compliance w medication regimen   ? Schizophrenia (HCC)   ?  ?Past Surgical History:  ?Procedure Laterality Date  ? BIOPSY  09/25/2021  ? Procedure: BIOPSY;  Surgeon: Rachael Fee, MD;  Location: [facility];  Service: Gastroenterology;;  ? ESOPHAGOGASTRODUODENOSCOPY (EGD) WITH PROPOFOL [name] 09/25/2021  ? Procedure: ESOPHAGOGASTRODUODENOSCOPY (EGD) WITH PROPOFOL;  Surgeon: Rachael Fee, MD;  Location: [facility];  Service: Gastroenterology;  Laterality: [name];  With NGT placement  ? ?Family History: History reviewed. No pertinent family history. ? ?Social History:  ?Social History  ? ?Substance and Sexual Activity  ?Alcohol Use Not Currently  ? Comment: Refuses to disclose how much  ?   ?Social History  ? ?Substance and Sexual Activity  ?Drug Use No  ? Comment: Refuses to answer  ?  ?Social History  ? ?Socioeconomic History  ? Marital status: Single  ?  Spouse name: Not on  file  ? Number of children: Not on file  ? Years of education: Not on file  ? Highest education level: Not on file  ?Occupational History  ? Not on file  ?Tobacco Use  ? Smoking status: Some Days  ?  Packs/day: 0.50  ?  Types: Cigarettes  ? Smokeless tobacco: Never  ?Vaping Use  ? Vaping Use: Every day  ?Substance and Sexual Activity  ? Alcohol use: Not Currently  ?  Comment: Refuses to disclose how much  ? Drug use: No  ?  Comment: Refuses to answer  ? Sexual activity: Never  ?  Comment: refused to answer  ?Other Topics Concern  ? Not on file  ?Social History Narrative  ? Not on file  ? ?Social Determinants of Health  ? ?Financial Resource Strain: Not on file  ?Food Insecurity: Not on file  ?Transportation Needs: Not on file  ?Physical Activity: Not on file  ?Stress: Not on file  ?Social Connections: Not on file  ? ?Additional Social History:  ?  ?  ?  ?  ?  ?  ?  ?  ?  ?  ?  ? ?Sleep: Good ? ?Appetite:  Good ? ?Current Medications: ?Current Facility-Administered Medications  ?Medication Dose Route Frequency Provider Last Rate Last Admin  ? (feeding supplement) PROSource Plus liquid 30 mL  30 mL Oral BID BM [name], [name], MD   30 mL at 10/29/21 0958  ? acetaminophen (TYLENOL) tablet 650 mg  650 mg Oral Q6H PRN [name], [name], MD      ?  alum & mag hydroxide-simeth (MAALOX/MYLANTA) 200-200-20 MG/5ML suspension 30 mL  30 mL Oral Q4H PRN [name], [name], MD      ? benztropine (COGENTIN) tablet 0.5 mg  0.5 mg Oral BID PRN [name], [name], MD      ? clonazePAM Scarlette Calico) disintegrating tablet 0.25 mg  0.25 mg Oral BID [name], [name], MD   0.25 mg at 10/29/21 3295  ? haloperidol (HALDOL) tablet 5 mg  5 mg Oral BID [name], [name], MD   5 mg at 10/29/21 1884  ? magnesium hydroxide (MILK OF MAGNESIA) suspension 30 mL  30 mL Oral Daily PRN [name], [name], MD      ? menthol-cetylpyridinium (CEPACOL) lozenge 3 mg  1 lozenge Oral PRN [name], [name], MD      ? midodrine (PROAMATINE) tablet 2.5 mg  2.5 mg Oral TID WC  [name], [name], MD   2.5 mg at 10/29/21 1150  ? mirtazapine (REMERON SOL-TAB) disintegrating tablet 7.5 mg  7.5 mg Oral QHS [name], [name], MD   7.5 mg at 10/28/21 2113  ? multivitamin with minerals tablet 1 tablet  1 tablet Oral Daily [name], [name], MD   1 tablet at 10/29/21 0951  ? OLANZapine zydis (ZYPREXA) disintegrating tablet 5 mg  5 mg Oral QHS [name], DO   5 mg at 10/28/21 2113  ? thiamine tablet 100 mg  100 mg Oral Daily [name], [name], MD   100 mg at 10/29/21 0951  ? traZODone (DESYREL) tablet 50 mg  50 mg Oral QHS PRN [name], [name], MD      ? ? ?Lab Results: No results found for this or any previous visit (from the past 48 hour(s)). ? ?Blood Alcohol level:  ?Lab Results  ?Component Value Date  ? ETH <10 09/12/2021  ? ETH <10 10/01/2020  ? ? ?Metabolic Disorder Labs: ?Lab Results  ?Component Value Date  ? HGBA1C 5.6 10/21/2021  ? MPG 114 10/21/2021  ? MPG 111 10/02/2021  ? ?Lab Results  ?Component Value Date  ? PROLACTIN 13.6 04/16/2015  ? ?Lab Results  ?Component Value Date  ? CHOL 189 10/21/2021  ? TRIG 52 10/21/2021  ? HDL 50 10/21/2021  ? CHOLHDL 3.8 10/21/2021  ? VLDL 10 10/21/2021  ? LDLCALC 129 (H) 10/21/2021  ? LDLCALC 93 10/01/2020  ? ? ?Physical Findings: ?AIMS:  , ,  ,  ,    ?CIWA:    ?COWS:    ? ?Musculoskeletal: ?Strength & Muscle Tone: within normal limits ?Gait & Station: normal ?Patient leans: [name] ? ?Psychiatric Specialty Exam: ? ?Presentation  ?General Appearance: Casual; Appropriate for Environment; Fairly Groomed ? ?Eye Contact:Fair ? ?Speech:Slow ? ?Speech Volume:Decreased ? ?Handedness:Right ? ? ?Mood and Affect  ?Mood:Anxious; Depressed ? ?Affect:Blunt ? ? ?Thought Process  ?Thought Processes:Disorganized ? ?Descriptions of Associations:Loose ? ?Orientation:Partial ? ?Thought Content:Delusions; Illogical; Paranoid Ideation ? ?History of Schizophrenia/Schizoaffective disorder:Yes ? ?Duration of Psychotic Symptoms:Greater than six months ? ?Hallucinations:No data  recorded ?Ideas of Reference:Paranoia ? ?Suicidal Thoughts:No data recorded ?Homicidal Thoughts:No data recorded ? ?Sensorium  ?Memory:Recent Poor; Remote Poor ? ?Judgment:Impaired ? ?Insight:Fair ? ? ?Executive Functions  ?Concentration:Poor ? ?Attention Span:Poor ? ?Recall:Poor ? ?Fund of Knowledge:Poor ? ?Language:Fair ? ? ?Psychomotor Activity  ?Psychomotor Activity:No data recorded ? ?Assets  ?Assets:Communication Skills; Resilience; Financial Resources/Insurance; Desire for Improvement; Physical Health; Social Support ? ? ?Sleep  ?Sleep:No data recorded ? ? ?Physical Exam: ?Physical Exam ?Vitals and nursing note reviewed.  ?Constitutional:   ?  Appearance: Normal appearance. She is normal weight.  ?Neurological:  ?   General: No focal deficit present.  ?   Mental Status: She is alert and oriented to person, place, and time.  ?Psychiatric:     ?   Attention and Perception: Attention and perception normal.     ?   Mood and Affect: Mood and affect normal.     ?   Speech: Speech normal.     ?   Behavior: Behavior normal. Behavior is cooperative.     ?   Thought Content: Thought content is paranoid.     ?   Cognition and Memory: Cognition and memory normal.     ?   Judgment: Judgment normal.  ? ?Review of Systems  ?Constitutional: Negative.   ?HENT: Negative.    ?Eyes: Negative.   ?Respiratory: Negative.    ?Cardiovascular: Negative.   ?Gastrointestinal: Negative.   ?Genitourinary: Negative.   ?Musculoskeletal: Negative.   ?Skin: Negative.   ?Neurological: Negative.   ?Endo/Heme/Allergies: Negative.   ?Psychiatric/Behavioral: Negative.    ?Blood pressure 95/65, pulse (!) 59, temperature 98.5 ?F (36.9 ?C), temperature source Oral, resp. rate 18, height 5\' 4"  (1.626 m), weight 44.5 kg, SpO2 100 %. Body mass index is 16.82 kg/m?. ? ? ?Treatment Plan Summary: ?Daily contact with patient to assess and evaluate symptoms and progress in treatment, Medication management, and Plan continue current medications.  Social work  is trying to work with the family for discharge plan. ? ?Sarina Illichard Edward Maryetta Shafer, DO ?10/29/2021, 12:08 PM ? ?

## 2021-10-29 NOTE — Progress Notes (Signed)
Patient is calm and cooperative.She denies SI,  AVH, depression and anxiety. When asked of HI, patient stated " I don't want to harm anybody but I want to get even with the people that are setting me up." Patient would not say who is setting her up. Patient is isolative and interacts minimally with others when in the dayroom. She ate breakfast and participated in group therapy this morning. Patient is compliant with scheduled medications. She denies any pain at this time. Patient remains safe on the unit at this time with Q 15 minute safety checks. ?

## 2021-10-29 NOTE — Progress Notes (Signed)
Recreation Therapy Notes ? ? ?Date: 10/29/2021 ? ?Time: 1:45pm   ? ?Location: Dayroom   ? ?Behavioral response: [name] ?  ?Intervention Topic: Problem Solving  ? ?Discussion/Intervention: ?Patient refused to attend group.  ? ?Clinical Observations/Feedback:  ?Patient refused to attend group.  ?  ?Raymont Andreoni LRT/CTRS ? ? ? ? ? ? ? ? ?Suzzane Quilter ?10/29/2021 3:13 PM ?

## 2021-10-30 NOTE — Progress Notes (Signed)
?   10/30/21 1928  ?Vital Signs  ?Temp 97.7 ?F (36.5 ?C)  ?Pulse Rate (!) 56  ?Resp 17  ?BP (!) 92/38  ?BP Location Left Arm  ?BP Method Automatic  ?Patient Position (if appropriate) Sitting  ?Oxygen Therapy  ?SpO2 100 %  ?O2 Device Room Air  ? ?Tech informed this Clinical research associate that pt BP low. Pt given Gatorade. Will recheck VS in 30 minutes. Pt denies any dizziness or symptoms of low blood pressure. Pt encouraged to drink all of the Gatorade. ?

## 2021-10-30 NOTE — BH IP Treatment Plan (Signed)
Interdisciplinary Treatment and Diagnostic Plan Update ? ?10/30/2021 ?Time of Session: 9:30AM ?[name] ?MRN: [id number] ? ?Principal Diagnosis: Schizophrenia, undifferentiated (HCC) ? ?Secondary Diagnoses: Principal Problem: ?  Schizophrenia, undifferentiated (HCC) ? ? ?Current Medications:  ?Current Facility-Administered Medications  ?Medication Dose Route Frequency Provider Last Rate Last Admin  ? (feeding supplement) PROSource Plus liquid 30 mL  30 mL Oral BID BM [name], [name], MD   30 mL at 10/30/21 1421  ? acetaminophen (TYLENOL) tablet 650 mg  650 mg Oral Q6H PRN [name], Jackquline DenmarkJohn T, MD      ? alum & mag hydroxide-simeth (MAALOX/MYLANTA) 200-200-20 MG/5ML suspension 30 mL  30 mL Oral Q4H PRN [name], [name], MD      ? benztropine (COGENTIN) tablet 0.5 mg  0.5 mg Oral BID PRN [name], Jackquline DenmarkJohn T, MD      ? clonazePAM Scarlette Calico(KLONOPIN) disintegrating tablet 0.25 mg  0.25 mg Oral BID [name], [name], MD   0.25 mg at 10/30/21 [id number]  ? haloperidol (HALDOL) tablet 5 mg  5 mg Oral BID [name], [name], MD   5 mg at 10/30/21 [id number]  ? magnesium hydroxide (MILK OF MAGNESIA) suspension 30 mL  30 mL Oral Daily PRN [name], Jackquline DenmarkJohn T, MD      ? menthol-cetylpyridinium (CEPACOL) lozenge 3 mg  1 lozenge Oral PRN [name], [name], MD      ? midodrine (PROAMATINE) tablet 2.5 mg  2.5 mg Oral TID WC [name], [name], MD   2.5 mg at 10/30/21 1229  ? mirtazapine (REMERON SOL-TAB) disintegrating tablet 7.5 mg  7.5 mg Oral QHS [name], [name], MD   7.5 mg at 10/29/21 2209  ? multivitamin with minerals tablet 1 tablet  1 tablet Oral Daily [name], Jackquline DenmarkJohn T, MD   1 tablet at 10/30/21 [id number]  ? OLANZapine zydis (ZYPREXA) disintegrating tablet 5 mg  5 mg Oral QHS Sarina IllHerrick, Richard Edward, DO   5 mg at 10/29/21 2209  ? thiamine tablet 100 mg  100 mg Oral Daily [name], Jackquline DenmarkJohn T, MD   100 mg at 10/30/21 [id number]  ? traZODone (DESYREL) tablet 50 mg  50 mg Oral QHS PRN [name], Jackquline DenmarkJohn T, MD      ? ?PTA Medications: ?Medications Prior to Admission  ?Medication Sig Dispense  Refill Last Dose  ? benztropine (COGENTIN) 0.5 MG tablet Take 1 tablet (0.5 mg total) by mouth 2 (two) times daily as needed for tremors.     ? clonazePAM (KLONOPIN) 0.5 MG tablet Place 1 tablet (0.5 mg total) into feeding tube 2 (two) times daily. 30 tablet 0   ? haloperidol (HALDOL) 5 MG tablet Take 1 tablet (5 mg total) by mouth 2 (two) times daily.     ? midodrine (PROAMATINE) 2.5 MG tablet Take 1 tablet (2.5 mg total) by mouth 3 (three) times daily with meals.  0   ? mirtazapine (REMERON SOL-TAB) 15 MG disintegrating tablet Take 0.5 tablets (7.5 mg total) by mouth at bedtime. 30 tablet 0   ? Multiple Vitamin (MULTIVITAMIN WITH MINERALS) TABS tablet Take 1 tablet by mouth daily.     ? thiamine 100 MG tablet Take 1 tablet (100 mg total) by mouth daily.     ? traZODone (DESYREL) 50 MG tablet Take 1 tablet (50 mg total) by mouth at bedtime as needed for sleep. 30 tablet 0   ? ? ?Patient Stressors: Health problems   ? ?Patient Strengths: Other: Unable to determined at this time ? ?Treatment Modalities: Medication Management, Group therapy, Case management,  ?  1 to 1 session with clinician, Psychoeducation, Recreational therapy. ? ? ?Physician Treatment Plan for Primary Diagnosis: Schizophrenia, undifferentiated (HCC) ?Long Term Goal(s): Improvement in symptoms so as ready for discharge  ? ?Short Term Goals: Ability to disclose and discuss suicidal ideas ?Ability to identify and develop effective coping behaviors will improve ?Ability to verbalize feelings will improve ?Ability to maintain clinical measurements within normal limits will improve ?Compliance with prescribed medications will improve ? ?Medication Management: Evaluate patient's response, side effects, and tolerance of medication regimen. ? ?Therapeutic Interventions: 1 to 1 sessions, Unit Group sessions and Medication administration. ? ?Evaluation of Outcomes: Adequate for Discharge ? ?Physician Treatment Plan for Secondary Diagnosis: Principal Problem: ?   Schizophrenia, undifferentiated (HCC) ? ?Long Term Goal(s): Improvement in symptoms so as ready for discharge  ? ?Short Term Goals: Ability to disclose and discuss suicidal ideas ?Ability to identify and develop effective coping behaviors will improve ?Ability to verbalize feelings will improve ?Ability to maintain clinical measurements within normal limits will improve ?Compliance with prescribed medications will improve    ? ?Medication Management: Evaluate patient's response, side effects, and tolerance of medication regimen. ? ?Therapeutic Interventions: 1 to 1 sessions, Unit Group sessions and Medication administration. ? ?Evaluation of Outcomes: Adequate for Discharge ? ? ?RN Treatment Plan for Primary Diagnosis: Schizophrenia, undifferentiated (HCC) ?Long Term Goal(s): Knowledge of disease and therapeutic regimen to maintain health will improve ? ?Short Term Goals: Ability to remain free from injury will improve, Ability to verbalize frustration and anger appropriately will improve, Ability to demonstrate self-control, Ability to participate in decision making will improve, Ability to verbalize feelings will improve, Ability to identify and develop effective coping behaviors will improve, and Compliance with prescribed medications will improve ? ?Medication Management: RN will administer medications as ordered by provider, will assess and evaluate patient's response and provide education to patient for prescribed medication. RN will report any adverse and/or side effects to prescribing provider. ? ?Therapeutic Interventions: 1 on 1 counseling sessions, Psychoeducation, Medication administration, Evaluate responses to treatment, Monitor vital signs and CBGs as ordered, Perform/monitor CIWA, COWS, AIMS and Fall Risk screenings as ordered, Perform wound care treatments as ordered. ? ?Evaluation of Outcomes: Adequate for Discharge ? ? ?LCSW Treatment Plan for Primary Diagnosis: Schizophrenia, undifferentiated  (HCC) ?Long Term Goal(s): Safe transition to appropriate next level of care at discharge, Engage patient in therapeutic group addressing interpersonal concerns. ? ?Short Term Goals: Engage patient in aftercare planning with referrals and resources, Increase social support, Increase ability to appropriately verbalize feelings, Increase emotional regulation, Facilitate acceptance of mental health diagnosis and concerns, Identify triggers associated with mental health/substance abuse issues, and Increase skills for wellness and recovery ? ?Therapeutic Interventions: Assess for all discharge needs, 1 to 1 time with Child psychotherapist, Explore available resources and support systems, Assess for adequacy in community support network, Educate family and significant other(s) on suicide prevention, Complete Psychosocial Assessment, Interpersonal group therapy. ? ?Evaluation of Outcomes: Adequate for Discharge ? ? ?Progress in Treatment: ?Attending groups: Yes. ?Participating in groups: Yes. ?Taking medication as prescribed: Yes. ?Toleration medication: Yes. ?Family/Significant other contact made: Yes, individual(s) contacted:  SPE completed with pt's guardian [name] ?Patient understands diagnosis: No. ?Discussing patient identified problems/goals with staff: No. ?Medical problems stabilized or resolved: Yes. ?Denies suicidal/homicidal ideation: Yes. ?Issues/concerns per patient self-inventory: No. ?Other: None ? ?New problem(s) identified: No, Describe:  None ? ?New Short Term/Long Term Goal(s): Patient to work towards elimination of symptoms of psychosis, medication management for mood stabilization; development of  comprehensive mental wellness plan. Update 10/25/2021: No changes at this time. Update 10/30/21: No changes at this time. ? ? ?Patient Goals:  Patient simply states, "want to go home." Update 10/25/2021: No changes at this time. Update 10/30/21: No changes at this time. ? ?Discharge Plan or Barriers: No  psychosocial barriers identified at this time, assuming patient will return to place of residence when appropriate for discharge. CSW will confirm with legal guardian. Update 10/25/2021: Per legal guardian, p

## 2021-10-30 NOTE — Progress Notes (Signed)
Patient is calm and pleasant. She verbalizes sleeping well last night. Patient continues to be isolative, spends a lot of time in her room. Patient denies SI,HI,AVH, depression,anxiety and pain. She ate breakfast and lunch in the dayroom. Patient participated in group therapy this morning. She is compliant with scheduled medications. She denies any pain at this time. Patient remains safe on the unit at this time with Q 15 minute safety checks. ?

## 2021-10-30 NOTE — Progress Notes (Signed)
Pt in room sitting in chair at this hour. No apparent distress. RR even and unlabored. Pt denies SI, HI, AVH and pain. Denies anxiety and depression. Pt states she spoke to a provider today. "We talked but we never came to a conclusion about discharge. I just want to go home. I'm not here of my own accord." Pt is pleasant and oriented. Pt states that she takes her medications at home and that her provider is Dr. Leda Quail. "She gives me light medication because she's worried that my blood pressure will be low. They giving me too many meds here so my blood pressure is low." Pt says she lives with her grandmother and is ready to go back. Pt endorses good appetite and sleep. Monitored for safety.  ?

## 2021-10-30 NOTE — Progress Notes (Signed)
[facility] MD Progress Note ? ?10/30/2021 11:29 AM ?[name]  ?MRN:  [id number] ?Subjective: Patient is seen on rounds today.  I discussed long-acting injection with her she stated that she would take her medications.  She apparently lives with her mom.  She has been compliant with her medication and eating and sleeping well.  I will discuss discharge plans with social work probably early next week. ? ?Principal Problem: Schizophrenia, undifferentiated ([place]) ?Diagnosis: Principal Problem: ?  Schizophrenia, undifferentiated ([place]) ? ?Total Time spent with patient: 15 minutes ? ?Past Psychiatric History: She was hospitalized at [facility] in March 2022 and September 2021.  Patient has had multiple psychiatric hospitalizations in the past.  She was at [facility] in 2020 secondary to similar circumstances.  She had been hospitalized apparently at an outside facility [facility]) for 4 weeks prior to being transferred to [facility].  Outpatient at the [facility]. ? ?Past Medical History:  ?Past Medical History:  ?Diagnosis Date  ? Anemia 10/02/2021  ? Non compliance w medication regimen   ? Schizophrenia ([place])   ?  ?Past Surgical History:  ?Procedure Laterality Date  ? BIOPSY  09/25/2021  ? Procedure: BIOPSY;  Surgeon: Milus Banister, MD;  Location: [facility];  Service: Gastroenterology;;  ? ESOPHAGOGASTRODUODENOSCOPY (EGD) WITH PROPOFOL [name] 09/25/2021  ? Procedure: ESOPHAGOGASTRODUODENOSCOPY (EGD) WITH PROPOFOL;  Surgeon: Milus Banister, MD;  Location: [facility] ENDOS[facility];  Service: Gastroenterology;  Laterality: [name];  With NGT placement  ? ?Family History: History reviewed. No pertinent family history. ? ?Social History:  ?Social History  ? ?Substance and Sexual Activity  ?Alcohol Use Not Currently  ? Comment: Refuses to disclose how much  ?   ?Social History  ? ?Substance and Sexual Activity  ?Drug Use No  ? Comment: Refuses to answer  ?  ?Social History   ? ?Socioeconomic History  ? Marital status: Single  ?  Spouse name: Not on file  ? Number of children: Not on file  ? Years of education: Not on file  ? Highest education level: Not on file  ?Occupational History  ? Not on file  ?Tobacco Use  ? Smoking status: Some Days  ?  Packs/day: 0.50  ?  Types: Cigarettes  ? Smokeless tobacco: Never  ?Vaping Use  ? Vaping Use: Every day  ?Substance and Sexual Activity  ? Alcohol use: Not Currently  ?  Comment: Refuses to disclose how much  ? Drug use: No  ?  Comment: Refuses to answer  ? Sexual activity: Never  ?  Comment: refused to answer  ?Other Topics Concern  ? Not on file  ?Social History Narrative  ? Not on file  ? ?Social Determinants of Health  ? ?Financial Resource Strain: Not on file  ?Food Insecurity: Not on file  ?Transportation Needs: Not on file  ?Physical Activity: Not on file  ?Stress: Not on file  ?Social Connections: Not on file  ? ?Additional Social History:  ?  ?  ?  ?  ?  ?  ?  ?  ?  ?  ?  ? ?Sleep: Good ? ?Appetite:  Good ? ?Current Medications: ?Current Facility-Administered Medications  ?Medication Dose Route Frequency Provider Last Rate Last Admin  ? (feeding supplement) PROSource Plus liquid 30 mL  30 mL Oral BID BM [name], [name], MD   30 mL at 10/30/21 0908  ? acetaminophen (TYLENOL) tablet 650 mg  650 mg Oral Q6H PRN [name], [name], MD      ?  alum & mag hydroxide-simeth (MAALOX/MYLANTA) 200-200-20 MG/5ML suspension 30 mL  30 mL Oral Q4H PRN [name], [name], MD      ? benztropine (COGENTIN) tablet 0.5 mg  0.5 mg Oral BID PRN [name], [name], MD      ? clonazePAM Bobbye Charleston) disintegrating tablet 0.25 mg  0.25 mg Oral BID [name], [name], MD   0.25 mg at 10/30/21 [id number]  ? haloperidol (HALDOL) tablet 5 mg  5 mg Oral BID [name], [name], MD   5 mg at 10/30/21 [id number]  ? magnesium hydroxide (MILK OF MAGNESIA) suspension 30 mL  30 mL Oral Daily PRN [name], [name], MD      ? menthol-cetylpyridinium (CEPACOL) lozenge 3 mg  1 lozenge Oral PRN [name], [name], MD      ? midodrine (PROAMATINE) tablet 2.5 mg  2.5 mg Oral TID WC [name], [name], MD   2.5 mg at 10/30/21 0853  ? mirtazapine (REMERON SOL-TAB) disintegrating tablet 7.5 mg  7.5 mg Oral QHS [name], [name], MD   7.5 mg at 10/29/21 2209  ? multivitamin with minerals tablet 1 tablet  1 tablet Oral Daily [name], [name], MD   1 tablet at 10/30/21 [id number]  ? OLANZapine zydis (ZYPREXA) disintegrating tablet 5 mg  5 mg Oral QHS Parks Ranger, DO   5 mg at 10/29/21 2209  ? thiamine tablet 100 mg  100 mg Oral Daily [name], [name], MD   100 mg at 10/30/21 [id number]  ? traZODone (DESYREL) tablet 50 mg  50 mg Oral QHS PRN [name], [name], MD      ? ? ?Lab Results: No results found for this or any previous visit (from the past 48 hour(s)). ? ?Blood Alcohol level:  ?Lab Results  ?Component Value Date  ? ETH <10 09/12/2021  ? ETH <10 10/01/2020  ? ? ?Metabolic Disorder Labs: ?Lab Results  ?Component Value Date  ? HGBA1C 5.6 10/21/2021  ? MPG 114 10/21/2021  ? MPG 111 10/02/2021  ? ?Lab Results  ?Component Value Date  ? PROLACTIN 13.6 04/16/2015  ? ?Lab Results  ?Component Value Date  ? CHOL 189 10/21/2021  ? TRIG 52 10/21/2021  ? HDL 50 10/21/2021  ? CHOLHDL 3.8 10/21/2021  ? VLDL 10 10/21/2021  ? LDLCALC 129 (H) 10/21/2021  ? [place] 93 10/01/2020  ? ? ?Physical Findings: ?AIMS:  , ,  ,  ,    ?CIWA:    ?COWS:    ? ?Musculoskeletal: ?Strength & Muscle Tone: within normal limits ?Gait & Station: normal ?Patient leans: [name] ? ?Psychiatric Specialty Exam: ? ?Presentation  ?General Appearance: Casual; Appropriate for Environment; Fairly Groomed ? ?Eye Contact:Fair ? ?Speech:Slow ? ?Speech Volume:Decreased ? ?Handedness:Right ? ? ?Mood and Affect  ?Mood:Anxious; Depressed ? ?Affect:Blunt ? ? ?Thought Process  ?Thought Processes:Disorganized ? ?Descriptions of Associations:Loose ? ?Orientation:Partial ? ?Thought Content:Delusions; Illogical; Paranoid Ideation ? ?History of Schizophrenia/Schizoaffective disorder:Yes ? ?Duration  of Psychotic Symptoms:Greater than six months ? ?Hallucinations:No data recorded ?Ideas of Reference:Paranoia ? ?Suicidal Thoughts:No data recorded ?Homicidal Thoughts:No data recorded ? ?Sensorium  ?Memory:Recent Poor; Remote Poor ? ?Judgment:Impaired ? ?Insight:Fair ? ? ?Executive Functions  ?Concentration:Poor ? ?Attention Span:Poor ? ?Recall:Poor ? ?Fund of Knowledge:Poor ? ?Language:Fair ? ? ?Psychomotor Activity  ?Psychomotor Activity:No data recorded ? ?Assets  ?Assets:Communication Skills; Resilience; Financial Resources/Insurance; Desire for Improvement; Physical Health; Social Support ? ? ?Sleep  ?Sleep:No data recorded ? ? ?Physical Exam: ?Physical Exam ?Vitals and nursing note reviewed.  ?Constitutional:   ?  Appearance: Normal appearance. She is normal weight.  ?Neurological:  ?   General: No focal deficit present.  ?   Mental Status: She is alert and oriented to person, place, and time.  ?Psychiatric:     ?   Attention and Perception: Attention and perception normal.     ?   Mood and Affect: Mood is anxious. Affect is labile.     ?   Speech: Speech is tangential.     ?   Behavior: Behavior normal. Behavior is cooperative.     ?   Thought Content: Thought content is paranoid.     ?   Cognition and Memory: Cognition and memory normal.     ?   Judgment: Judgment is inappropriate.  ? ?Review of Systems  ?Constitutional: Negative.   ?HENT: Negative.    ?Eyes: Negative.   ?Respiratory: Negative.    ?Cardiovascular: Negative.   ?Gastrointestinal: Negative.   ?Genitourinary: Negative.   ?Musculoskeletal: Negative.   ?Skin: Negative.   ?Neurological: Negative.   ?Endo/Heme/Allergies: Negative.   ?Psychiatric/Behavioral: Negative.    ?Blood pressure 91/76, pulse (!) 57, temperature 97.7 ?F (36.5 ?C), temperature source Oral, resp. rate 18, height 5\' 4"  (1.626 m), weight 44.5 kg, SpO2 97 %. Body mass index is 16.82 kg/m?. ? ? ?Treatment Plan Summary: ?Daily contact with patient to assess and evaluate symptoms  and progress in treatment, Medication management, and Plan continue current medications. ? ?Parks Ranger, DO ?10/30/2021, 11:29 AM ? ?

## 2021-10-30 NOTE — Progress Notes (Signed)
Recreation Therapy Notes ? ? ?Date: 10/30/2021 ? ?Time: 2:05pm   ? ?Location: Craft room    ? ?Behavioral response: [name] ?  ?Intervention Topic: Communication  ? ?Discussion/Intervention: ?Patient refused to attend group.  ? ?Clinical Observations/Feedback:  ?Patient refused to attend group.  ?  ?Shawnna Pancake LRT/CTRS ? ? ? ? ? ? ? ? ?[name] ?10/30/2021 3:54 PM ?

## 2021-10-30 NOTE — [facility] Group Notes (Addendum)
[place] Group Notes:  (Nursing/MHT/Case Management/Adjunct) ? ?Date:  10/30/2021  ?Time:  10:00 AM ? ?Type of Therapy:  Psychoeducational Skills ? ?Participation Level:  Minimal ? ?Participation Quality:  Appropriate ? ?Affect:  Appropriate ? ?Cognitive:  Appropriate ? ?Insight:  Lacking ? ?Engagement in Group:  Limited ? ?Modes of Intervention:  Discussion ? ?Summary of Progress/Problems: ?The pt attended group with minimal participation. Pt had to be asked multiple times to share and what was shared was very limited. ?[name] ?10/30/2021, 11:17 AM ?

## 2021-10-30 NOTE — Progress Notes (Signed)
?   10/30/21 2026  ?Vital Signs  ?Pulse Rate (!) 50  ?Pulse Rate Source Monitor  ?BP 103/65  ?BP Location Left Arm  ?BP Method Automatic  ?Patient Position (if appropriate) Sitting  ?Oxygen Therapy  ?SpO2 100 %  ? ?Pt drank Gatorade and VS reassessed. Pt denies any symptoms of low BP. Will continue to monitor. ?

## 2021-10-31 NOTE — [facility] Counselor (Signed)
CSW contacted pt's guardian, Zane Herald [phone] to discuss discharge. She stated that Wednesday discharge would be fine.  ?Regarding follow up, she stated that pt is seen at [facility]. CSW will contact [facility] to schedule follow up for pt with request for ACTT services referral per guardian.  ? ?Maher Shon [place], MSW, LCSW-A ?4/14/20232:25 PM  ?

## 2021-10-31 NOTE — [facility] Group Notes (Signed)
[place] Group Notes:  (Nursing/MHT/Case Management/Adjunct) ? ?Date:  10/31/2021  ?Time:  10:00 AM ? ?Type of Therapy:  Psychoeducational Skills ? ?Participation Level:  Active ? ?Participation Quality:  Appropriate ? ?Affect:  Appropriate ? ?Cognitive:  Appropriate ? ?Insight:  Appropriate ? ?Engagement in Group:  Engaged ? ?Modes of Intervention:  Discussion ? ?Summary of Progress/Problems: ?The pt attended group but did not share when asked. Very minimal participation. ?Juliette Alcide ?10/31/2021, 11:29 AM ?

## 2021-10-31 NOTE — Plan of Care (Signed)
Patient remain alert and oriented, calm and cooperative during assessment. Denies anxiety, depression, pain or discomfort at this time. Denies SI, HI, and AVH. Ate meals in the day room among staff and peers with good appetite. Compliant with all due medications. Currently in the day room among peers watching TV in no apparent distress. Remain safe on the unit with Q 15 minute safety check. ? ?Problem: Education: ?Goal: Knowledge of [facility] Education information/materials will improve ?Outcome: Progressing ?Goal: Mental status will improve ?Outcome: Progressing ?  ?Problem: Activity: ?Goal: Interest or engagement in activities will improve ?Outcome: Progressing ?  ?

## 2021-10-31 NOTE — Progress Notes (Signed)
[facility] MD Progress Note ? ?10/31/2021 3:01 PM ?[name]  ?MRN:  [id number] ?Subjective: Follow-up for 61 year old woman with schizophrenia.  Patient had initially come into the hospital with catatonia which improved with ECT.  On the geriatric unit she has shown further improvement.  She is eating regularly and has gained weight and looks much more healthy.  Patient was pleasant in conversation.  Had no complaints.  Denied any hallucinations.  Did not make any hostile statements. ?Principal Problem: Schizophrenia, undifferentiated (HCC) ?Diagnosis: Principal Problem: ?  Schizophrenia, undifferentiated (HCC) ? ?Total Time spent with patient: 30 minutes ? ?Past Psychiatric History: Past history of longstanding schizophrenia usually stable with medication management ? ?Past Medical History:  ?Past Medical History:  ?Diagnosis Date  ? Anemia 10/02/2021  ? Non compliance w medication regimen   ? Schizophrenia (HCC)   ?  ?Past Surgical History:  ?Procedure Laterality Date  ? BIOPSY  09/25/2021  ? Procedure: BIOPSY;  Surgeon: Rachael Fee, MD;  Location: [facility];  Service: Gastroenterology;;  ? ESOPHAGOGASTRODUODENOSCOPY (EGD) WITH PROPOFOL [name] 09/25/2021  ? Procedure: ESOPHAGOGASTRODUODENOSCOPY (EGD) WITH PROPOFOL;  Surgeon: Rachael Fee, MD;  Location: [facility];  Service: Gastroenterology;  Laterality: [name];  With NGT placement  ? ?Family History: History reviewed. No pertinent family history. ?Family Psychiatric  History: See previous ?Social History:  ?Social History  ? ?Substance and Sexual Activity  ?Alcohol Use Not Currently  ? Comment: Refuses to disclose how much  ?   ?Social History  ? ?Substance and Sexual Activity  ?Drug Use No  ? Comment: Refuses to answer  ?  ?Social History  ? ?Socioeconomic History  ? Marital status: Single  ?  Spouse name: Not on file  ? Number of children: Not on file  ? Years of education: Not on file  ? Highest education level: Not on file  ?Occupational History  ? Not on file   ?Tobacco Use  ? Smoking status: Some Days  ?  Packs/day: 0.50  ?  Types: Cigarettes  ? Smokeless tobacco: Never  ?Vaping Use  ? Vaping Use: Every day  ?Substance and Sexual Activity  ? Alcohol use: Not Currently  ?  Comment: Refuses to disclose how much  ? Drug use: No  ?  Comment: Refuses to answer  ? Sexual activity: Never  ?  Comment: refused to answer  ?Other Topics Concern  ? Not on file  ?Social History Narrative  ? Not on file  ? ?Social Determinants of Health  ? ?Financial Resource Strain: Not on file  ?Food Insecurity: Not on file  ?Transportation Needs: Not on file  ?Physical Activity: Not on file  ?Stress: Not on file  ?Social Connections: Not on file  ? ?Additional Social History:  ?  ?  ?  ?  ?  ?  ?  ?  ?  ?  ?  ? ?Sleep: Fair ? ?Appetite:  Fair ? ?Current Medications: ?Current Facility-Administered Medications  ?Medication Dose Route Frequency Provider Last Rate Last Admin  ? (feeding supplement) PROSource Plus liquid 30 mL  30 mL Oral BID BM Wah Sabic T, MD   30 mL at 10/31/21 0919  ? acetaminophen (TYLENOL) tablet 650 mg  650 mg Oral Q6H PRN Keyonta Madrid T, MD      ? alum & mag hydroxide-simeth (MAALOX/MYLANTA) 200-200-20 MG/5ML suspension 30 mL  30 mL Oral Q4H PRN Debria Broecker T, MD      ? benztropine (COGENTIN) tablet 0.5 mg  0.5 mg Oral  BID PRN Tammi Boulier, Jackquline DenmarkJohn T, MD      ? clonazePAM Scarlette Calico(KLONOPIN) disintegrating tablet 0.25 mg  0.25 mg Oral BID Betty Brooks, Jackquline DenmarkJohn T, MD   0.25 mg at 10/31/21 [id number]  ? haloperidol (HALDOL) tablet 5 mg  5 mg Oral BID Tyrann Donaho T, MD   5 mg at 10/31/21 [id number]  ? magnesium hydroxide (MILK OF MAGNESIA) suspension 30 mL  30 mL Oral Daily PRN Talissa Apple, Jackquline DenmarkJohn T, MD      ? menthol-cetylpyridinium (CEPACOL) lozenge 3 mg  1 lozenge Oral PRN Ciria Bernardini, Jackquline DenmarkJohn T, MD      ? midodrine (PROAMATINE) tablet 2.5 mg  2.5 mg Oral TID WC Jamison Soward T, MD   2.5 mg at 10/31/21 1239  ? mirtazapine (REMERON SOL-TAB) disintegrating tablet 7.5 mg  7.5 mg Oral QHS Brainard Highfill T, MD   7.5 mg at  10/30/21 2148  ? multivitamin with minerals tablet 1 tablet  1 tablet Oral Daily Marlaysia Lenig, Jackquline DenmarkJohn T, MD   1 tablet at 10/31/21 [phone]  ? OLANZapine zydis (ZYPREXA) disintegrating tablet 5 mg  5 mg Oral QHS Sarina IllHerrick, Richard Edward, DO   5 mg at 10/30/21 2148  ? thiamine tablet 100 mg  100 mg Oral Daily Laquesha Holcomb, Jackquline DenmarkJohn T, MD   100 mg at 10/31/21 [id number]  ? traZODone (DESYREL) tablet 50 mg  50 mg Oral QHS PRN Perrie Ragin, Jackquline DenmarkJohn T, MD      ? ? ?Lab Results: No results found for this or any previous visit (from the past 48 hour(s)). ? ?Blood Alcohol level:  ?Lab Results  ?Component Value Date  ? ETH <10 09/12/2021  ? ETH <10 10/01/2020  ? ? ?Metabolic Disorder Labs: ?Lab Results  ?Component Value Date  ? HGBA1C 5.6 10/21/2021  ? MPG 114 10/21/2021  ? MPG 111 10/02/2021  ? ?Lab Results  ?Component Value Date  ? PROLACTIN 13.6 04/16/2015  ? ?Lab Results  ?Component Value Date  ? CHOL 189 10/21/2021  ? TRIG 52 10/21/2021  ? HDL 50 10/21/2021  ? CHOLHDL 3.8 10/21/2021  ? VLDL 10 10/21/2021  ? LDLCALC 129 (H) 10/21/2021  ? LDLCALC 93 10/01/2020  ? ? ?Physical Findings: ?AIMS:  , ,  ,  ,    ?CIWA:    ?COWS:    ? ?Musculoskeletal: ?Strength & Muscle Tone: within normal limits ?Gait & Station: normal ?Patient leans: [name] ? ?Psychiatric Specialty Exam: ? ?Presentation  ?General Appearance: Casual; Appropriate for Environment; Fairly Groomed ? ?Eye Contact:Fair ? ?Speech:Slow ? ?Speech Volume:Decreased ? ?Handedness:Right ? ? ?Mood and Affect  ?Mood:Anxious; Depressed ? ?Affect:Blunt ? ? ?Thought Process  ?Thought Processes:Disorganized ? ?Descriptions of Associations:Loose ? ?Orientation:Partial ? ?Thought Content:Delusions; Illogical; Paranoid Ideation ? ?History of Schizophrenia/Schizoaffective disorder:Yes ? ?Duration of Psychotic Symptoms:Greater than six months ? ?Hallucinations:No data recorded ?Ideas of Reference:Paranoia ? ?Suicidal Thoughts:No data recorded ?Homicidal Thoughts:No data recorded ? ?Sensorium  ?Memory:Recent Poor; Remote  Poor ? ?Judgment:Impaired ? ?Insight:Fair ? ? ?Executive Functions  ?Concentration:Poor ? ?Attention Span:Poor ? ?Recall:Poor ? ?Fund of Knowledge:Poor ? ?Language:Fair ? ? ?Psychomotor Activity  ?Psychomotor Activity:No data recorded ? ?Assets  ?Assets:Communication Skills; Resilience; Financial Resources/Insurance; Desire for Improvement; Physical Health; Social Support ? ? ?Sleep  ?Sleep:No data recorded ? ? ?Physical Exam: ?Physical Exam ?Vitals and nursing note reviewed.  ?Constitutional:   ?   Appearance: Normal appearance.  ?HENT:  ?   Head: Normocephalic and atraumatic.  ?   Mouth/Throat:  ?   Pharynx: Oropharynx is clear.  ?Eyes:  ?  Pupils: Pupils are equal, round, and reactive to light.  ?Cardiovascular:  ?   Rate and Rhythm: Normal rate and regular rhythm.  ?Pulmonary:  ?   Effort: Pulmonary effort is normal.  ?   Breath sounds: Normal breath sounds.  ?Abdominal:  ?   General: Abdomen is flat.  ?   Palpations: Abdomen is soft.  ?Musculoskeletal:     ?   General: Normal range of motion.  ?Skin: ?   General: Skin is warm and dry.  ?Neurological:  ?   General: No focal deficit present.  ?   Mental Status: She is alert. Mental status is at baseline.  ?Psychiatric:     ?   Attention and Perception: Attention normal.     ?   Mood and Affect: Mood normal.     ?   Speech: Speech normal.     ?   Behavior: Behavior normal.     ?   Thought Content: Thought content normal.     ?   Cognition and Memory: Cognition normal.  ? ?Review of Systems  ?Constitutional: Negative.   ?HENT: Negative.    ?Eyes: Negative.   ?Respiratory: Negative.    ?Cardiovascular: Negative.   ?Gastrointestinal: Negative.   ?Musculoskeletal: Negative.   ?Skin: Negative.   ?Neurological: Negative.   ?Psychiatric/Behavioral: Negative.    ?Blood pressure 94/64, pulse (!) 51, temperature 98.3 ?F (36.8 ?C), temperature source Oral, resp. rate 18, height 5\' 4"  (1.626 m), weight 44.5 kg, SpO2 100 %. Body mass index is 16.82 kg/m?. ? ? ?Treatment Plan  Summary: ?Daily contact with patient to assess and evaluate symptoms and progress in treatment, Medication management, and Plan supportive therapy and review of plan with patient.  No change to [facility]

## 2021-10-31 NOTE — Progress Notes (Addendum)
Recreation Therapy Notes ? ?Date: 10/31/2021 ?  ?Time: 1:45pm  ?  ?Location: Dayroom   ?  ?Behavioral response: Appropriate ?  ?Intervention Topic: Relaxation   ?  ?Discussion/Intervention:  ?Group content today was focused on relaxation. The group defined relaxation and identified healthy ways to relax. Individuals expressed how much time they spend relaxing. Patients expressed how much their life would be if they did not make time for themselves to relax. The group stated ways they could improve their relaxation techniques in the future.  Individuals participated in the intervention ?Time to Relax? where they had a chance to experience different relaxation techniques.  ?Clinical Observations/Feedback: ?Patient came to group and defined relaxation as resting on your own time. Individual was social with peers and staff while participating in the intervention.    ?[name] LRT/CTRS  ? ? ? ? ? ? ? ?[name] ?10/31/2021 2:32 PM ?

## 2021-11-01 NOTE — Plan of Care (Signed)
Patient remain alert and oriented, calm and cooperative during assessment. Denies anxiety, depression, pain or discomfort at this time. Denies SI, HI, and AVH. Ate meals in the day room among staff and peers with good appetite. Compliant with all due medications. Currently in the day room among peers participating in chair yoga, no apparent distress. Remain safe on the unit with Q 15 minute safety check. ? ? ?Problem: Education: ?Goal: Knowledge of [facility] Education information/materials will improve ?Outcome: Progressing ?Goal: Mental status will improve ?Outcome: Progressing ?  ?Problem: Activity: ?Goal: Interest or engagement in activities will improve ?Outcome: Progressing ?  ?

## 2021-11-01 NOTE — Group Note (Signed)
[facility] LCSW Group Therapy Note ? ? ?Group Date: 11/01/2021 ?Start Time: 1300 ?End Time: 1400 ? ? ?Type of Therapy and Topic: Group Therapy: Avoiding Self-Sabotaging and Enabling Behaviors ? ?Participation Level: Did Not Attend ? ?Mood: ? ?Description of Group:  ?In this group, patients will learn how to identify obstacles, self-sabotaging and enabling behaviors, as well as: what are they, why do we do them and what needs these behaviors meet. Discuss unhealthy relationships and how to have positive healthy boundaries with those that sabotage and enable. Explore aspects of self-sabotage and enabling in yourself and how to limit these self-destructive behaviors in everyday life. ? ? ?Therapeutic Goals: ?1. Patient will identify one obstacle that relates to self-sabotage and enabling behaviors ?2. Patient will identify one personal self-sabotaging or enabling behavior they did prior to admission ?3. Patient will state a plan to change the above identified behavior ?4. Patient will demonstrate ability to communicate their needs through discussion and/or role play.  ? ? ?Summary of Patient Progress: Due to limited staffing, group was not held on the unit.  ? ? ? ? ?Therapeutic Modalities:  ?Cognitive Behavioral Therapy ?Person-Centered Therapy ?Motivational Interviewing ? ? ? ?Ileana Ladd Shandale Malak, LCSWA ?

## 2021-11-01 NOTE — Plan of Care (Signed)
Patient presents Flat and Isolative. Patient denies SI/HI/Avh and contracts for safety.  Patient denies Anxiety/Depression "I feel good".  Patient did comply with scheduled medications and denies any adverse reactions.  Patient refused night snack. Patient verbalized importance of compliance with treatment plan upon discharge.  Encouragement and support given. Q 15 minute safety rounding in place.  Will continue plan of care.   ? ? ?Problem: Education: ?Goal: Knowledge of [place] [facility] Education information/materials will improve ?Outcome: Progressing ?Goal: Mental status will improve ?Outcome: Progressing ?  ?Problem: Activity: ?Goal: Interest or engagement in activities will improve ?11/01/2021 2352 by Glynn Octave, RN ?Outcome: Progressing ? ?  ?

## 2021-11-01 NOTE — Progress Notes (Signed)
[facility] MD Progress Note ? ?11/01/2021 12:24 PM ?[name]  ?MRN:  [id number] ?Subjective: Follow-up for this patient with schizophrenia.  Patient is eating okay.  Vitals stable.  Blood pressure a little bit low but she has not reported any dizziness or had any falls.  Denies current hallucinations or psychotic symptoms. ?Principal Problem: Schizophrenia, undifferentiated (HCC) ?Diagnosis: Principal Problem: ?  Schizophrenia, undifferentiated (HCC) ? ?Total Time spent with patient: 30 minutes ? ?Past Psychiatric History: Past history of recurrent schizophrenia recent response to ECT for catatonia ? ?Past Medical History:  ?Past Medical History:  ?Diagnosis Date  ? Anemia 10/02/2021  ? Non compliance w medication regimen   ? Schizophrenia (HCC)   ?  ?Past Surgical History:  ?Procedure Laterality Date  ? BIOPSY  09/25/2021  ? Procedure: BIOPSY;  Surgeon: Rachael Fee, MD;  Location: [facility];  Service: Gastroenterology;;  ? ESOPHAGOGASTRODUODENOSCOPY (EGD) WITH PROPOFOL [name] 09/25/2021  ? Procedure: ESOPHAGOGASTRODUODENOSCOPY (EGD) WITH PROPOFOL;  Surgeon: Rachael Fee, MD;  Location: [facility];  Service: Gastroenterology;  Laterality: [name];  With NGT placement  ? ?Family History: History reviewed. No pertinent family history. ?Family Psychiatric  History: See previous ?Social History:  ?Social History  ? ?Substance and Sexual Activity  ?Alcohol Use Not Currently  ? Comment: Refuses to disclose how much  ?   ?Social History  ? ?Substance and Sexual Activity  ?Drug Use No  ? Comment: Refuses to answer  ?  ?Social History  ? ?Socioeconomic History  ? Marital status: Single  ?  Spouse name: Not on file  ? Number of children: Not on file  ? Years of education: Not on file  ? Highest education level: Not on file  ?Occupational History  ? Not on file  ?Tobacco Use  ? Smoking status: Some Days  ?  Packs/day: 0.50  ?  Types: Cigarettes  ? Smokeless tobacco: Never  ?Vaping Use  ? Vaping Use: Every day  ?Substance and  Sexual Activity  ? Alcohol use: Not Currently  ?  Comment: Refuses to disclose how much  ? Drug use: No  ?  Comment: Refuses to answer  ? Sexual activity: Never  ?  Comment: refused to answer  ?Other Topics Concern  ? Not on file  ?Social History Narrative  ? Not on file  ? ?Social Determinants of Health  ? ?Financial Resource Strain: Not on file  ?Food Insecurity: Not on file  ?Transportation Needs: Not on file  ?Physical Activity: Not on file  ?Stress: Not on file  ?Social Connections: Not on file  ? ?Additional Social History:  ?  ?  ?  ?  ?  ?  ?  ?  ?  ?  ?  ? ?Sleep: Fair ? ?Appetite:  Fair ? ?Current Medications: ?Current Facility-Administered Medications  ?Medication Dose Route Frequency Provider Last Rate Last Admin  ? (feeding supplement) PROSource Plus liquid 30 mL  30 mL Oral BID BM Shaddai Shapley T, MD   30 mL at 11/01/21 0910  ? acetaminophen (TYLENOL) tablet 650 mg  650 mg Oral Q6H PRN Fusae Florio, [name], MD      ? alum & mag hydroxide-simeth (MAALOX/MYLANTA) 200-200-20 MG/5ML suspension 30 mL  30 mL Oral Q4H PRN Dametri Ozburn T, MD      ? benztropine (COGENTIN) tablet 0.5 mg  0.5 mg Oral BID PRN Masaichi Kracht, [name], MD      ? clonazePAM (KLONOPIN) disintegrating tablet 0.25 mg  0.25 mg Oral BID Ilma Achee  T, MD   0.25 mg at 11/01/21 0910  ? haloperidol (HALDOL) tablet 5 mg  5 mg Oral BID Lavenia Stumpo T, MD   5 mg at 11/01/21 9024  ? magnesium hydroxide (MILK OF MAGNESIA) suspension 30 mL  30 mL Oral Daily PRN Nomar Broad, [name], MD      ? menthol-cetylpyridinium (CEPACOL) lozenge 3 mg  1 lozenge Oral PRN Faiz Weber, [name], MD      ? midodrine (PROAMATINE) tablet 2.5 mg  2.5 mg Oral TID WC Nicolette Gieske, [name], MD   2.5 mg at 11/01/21 0973  ? mirtazapine (REMERON SOL-TAB) disintegrating tablet 7.5 mg  7.5 mg Oral QHS Hanz Winterhalter T, MD   7.5 mg at 10/31/21 2123  ? multivitamin with minerals tablet 1 tablet  1 tablet Oral Daily Shareta Fishbaugh, [name], MD   1 tablet at 11/01/21 0910  ? OLANZapine zydis (ZYPREXA)  disintegrating tablet 5 mg  5 mg Oral QHS [name], DO   5 mg at 10/31/21 2125  ? thiamine tablet 100 mg  100 mg Oral Daily Cyleigh Massaro, [name], MD   100 mg at 11/01/21 0910  ? traZODone (DESYREL) tablet 50 mg  50 mg Oral QHS PRN Burnett Spray, [name], MD      ? ? ?Lab Results: No results found for this or any previous visit (from the past 48 hour(s)). ? ?Blood Alcohol level:  ?Lab Results  ?Component Value Date  ? ETH <10 09/12/2021  ? ETH <10 10/01/2020  ? ? ?Metabolic Disorder Labs: ?Lab Results  ?Component Value Date  ? HGBA1C 5.6 10/21/2021  ? MPG 114 10/21/2021  ? MPG 111 10/02/2021  ? ?Lab Results  ?Component Value Date  ? PROLACTIN 13.6 04/16/2015  ? ?Lab Results  ?Component Value Date  ? CHOL 189 10/21/2021  ? TRIG 52 10/21/2021  ? HDL 50 10/21/2021  ? CHOLHDL 3.8 10/21/2021  ? VLDL 10 10/21/2021  ? LDLCALC 129 (H) 10/21/2021  ? LDLCALC 93 10/01/2020  ? ? ?Physical Findings: ?AIMS:  , ,  ,  ,    ?CIWA:    ?COWS:    ? ?Musculoskeletal: ?Strength & Muscle Tone: within normal limits ?Gait & Station: normal ?Patient leans: [name] ? ?Psychiatric Specialty Exam: ? ?Presentation  ?General Appearance: Casual; Appropriate for Environment; Fairly Groomed ? ?Eye Contact:Fair ? ?Speech:Slow ? ?Speech Volume:Decreased ? ?Handedness:Right ? ? ?Mood and Affect  ?Mood:Anxious; Depressed ? ?Affect:Blunt ? ? ?Thought Process  ?Thought Processes:Disorganized ? ?Descriptions of Associations:Loose ? ?Orientation:Partial ? ?Thought Content:Delusions; Illogical; Paranoid Ideation ? ?History of Schizophrenia/Schizoaffective disorder:Yes ? ?Duration of Psychotic Symptoms:Greater than six months ? ?Hallucinations:No data recorded ?Ideas of Reference:Paranoia ? ?Suicidal Thoughts:No data recorded ?Homicidal Thoughts:No data recorded ? ?Sensorium  ?Memory:Recent Poor; Remote Poor ? ?Judgment:Impaired ? ?Insight:Fair ? ? ?Executive Functions  ?Concentration:Poor ? ?Attention Span:Poor ? ?Recall:Poor ? ?Fund of  Knowledge:Poor ? ?Language:Fair ? ? ?Psychomotor Activity  ?Psychomotor Activity:No data recorded ? ?Assets  ?Assets:Communication Skills; Resilience; Financial Resources/Insurance; Desire for Improvement; Physical Health; Social Support ? ? ?Sleep  ?Sleep:No data recorded ? ? ?Physical Exam: ?Physical Exam ?Vitals and nursing note reviewed.  ?Constitutional:   ?   Appearance: Normal appearance.  ?HENT:  ?   Head: Normocephalic and atraumatic.  ?   Mouth/Throat:  ?   Pharynx: Oropharynx is clear.  ?Eyes:  ?   Pupils: Pupils are equal, round, and reactive to light.  ?Cardiovascular:  ?   Rate and Rhythm: Normal rate and regular rhythm.  ?Pulmonary:  ?  Effort: Pulmonary effort is normal.  ?   Breath sounds: Normal breath sounds.  ?Abdominal:  ?   General: Abdomen is flat.  ?   Palpations: Abdomen is soft.  ?Musculoskeletal:     ?   General: Normal range of motion.  ?Skin: ?   General: Skin is warm and dry.  ?Neurological:  ?   General: No focal deficit present.  ?   Mental Status: She is alert. Mental status is at baseline.  ?Psychiatric:     ?   Attention and Perception: Attention normal.     ?   Mood and Affect: Mood normal. Affect is blunt.     ?   Speech: Speech is delayed.     ?   Behavior: Behavior is cooperative.     ?   Thought Content: Thought content normal.     ?   Cognition and Memory: Cognition is impaired.  ? ?Review of Systems  ?Constitutional: Negative.   ?HENT: Negative.    ?Eyes: Negative.   ?Respiratory: Negative.    ?Cardiovascular: Negative.   ?Gastrointestinal: Negative.   ?Musculoskeletal: Negative.   ?Skin: Negative.   ?Neurological: Negative.   ?Psychiatric/Behavioral: Negative.    ?Blood pressure (!) 90/58, pulse (!) 58, temperature 97.8 ?F (36.6 ?C), temperature source Oral, resp. rate 18, height 5\' 4"  (1.626 m), weight 44.5 kg, SpO2 99 %. Body mass index is 16.82 kg/m?. ? ? ?Treatment Plan Summary: ?Medication management and Plan no change to medication.  No new labs checked.  No change  to overall treatment plan which is to hope for discharge within the next few days. ? ?Mordecai RasmussenJohn Jadore Veals, MD ?11/01/2021, 12:24 PM ? ?

## 2021-11-01 NOTE — Plan of Care (Signed)
Patient Calm and Cooperative on unit.  Patient denies SI/HI/Avh and contracts for safety. Patient did comply with scheduled medications. Pt did consider to have low HR. Patient continues to isolate to room and have minimal interaction on unit.  Patient refused night snack and fluids.  Nursing staff continues to encourage intake.  Encouragement and support given during shift.  Q 15 minute safety rounding in place.  Plan of care will continue.  ? ? ?Problem: Education: ?Goal: Knowledge of [place] [facility] Education information/materials will improve ?Outcome: Progressing ?Goal: Mental status will improve ?Outcome: Progressing ?  ?Problem: Activity: ?Goal: Interest or engagement in activities will improve ?Outcome: Progressing ?  ?

## 2021-11-02 NOTE — BHH Group Notes (Signed)
10am Group: chair yoga - Pt present, limited in ROM , calm, composed  ?

## 2021-11-02 NOTE — Plan of Care (Signed)
Patient remain alert and oriented, calm and cooperative during assessment. Denies anxiety, depression, pain or discomfort at this time. Denies SI, HI, and AVH. Ate meals in the day room among staff and peers with good appetite. Compliant with all due medications. Currently in the day room among peers participating in chair yoga, no apparent distress. Remain safe on the unit with Q 15 minute safety check. ? ? ?Problem: Education: ?Goal: Knowledge of [place] Education information/materials will improve ?Outcome: Progressing ?Goal: Mental status will improve ?Outcome: Progressing ?  ?Problem: Activity: ?Goal: Interest or engagement in activities will improve ?Outcome: Progressing ?  ?

## 2021-11-02 NOTE — Progress Notes (Signed)
[facility] MD Progress Note ? ?11/02/2021 1:07 PM ?[name]  ?MRN:  [id number] ?Subjective: Patient seen and chart reviewed.  Patient has no specific new complaints.  Comes out and eats and occasionally interacts with others and attends groups but also spends a lot of time alone.  Denies any hallucinations.  Denies suicidal thoughts.  No bizarre behavior.  Vital signs show blood pressure a little better than yesterday.  No new labs. ?Principal Problem: Schizophrenia, undifferentiated (HCC) ?Diagnosis: Principal Problem: ?  Schizophrenia, undifferentiated (HCC) ? ?Total Time spent with patient: 30 minutes ? ?Past Psychiatric History: Past history of schizophrenia ? ?Past Medical History:  ?Past Medical History:  ?Diagnosis Date  ? Anemia 10/02/2021  ? Non compliance w medication regimen   ? Schizophrenia (HCC)   ?  ?Past Surgical History:  ?Procedure Laterality Date  ? BIOPSY  09/25/2021  ? Procedure: BIOPSY;  Surgeon: Rachael Fee, MD;  Location: [facility];  Service: Gastroenterology;;  ? ESOPHAGOGASTRODUODENOSCOPY (EGD) WITH PROPOFOL [name] 09/25/2021  ? Procedure: ESOPHAGOGASTRODUODENOSCOPY (EGD) WITH PROPOFOL;  Surgeon: Rachael Fee, MD;  Location: [facility];  Service: Gastroenterology;  Laterality: [name];  With NGT placement  ? ?Family History: History reviewed. No pertinent family history. ?Family Psychiatric  History: See previous ?Social History:  ?Social History  ? ?Substance and Sexual Activity  ?Alcohol Use Not Currently  ? Comment: Refuses to disclose how much  ?   ?Social History  ? ?Substance and Sexual Activity  ?Drug Use No  ? Comment: Refuses to answer  ?  ?Social History  ? ?Socioeconomic History  ? Marital status: Single  ?  Spouse name: Not on file  ? Number of children: Not on file  ? Years of education: Not on file  ? Highest education level: Not on file  ?Occupational History  ? Not on file  ?Tobacco Use  ? Smoking status: Some Days  ?  Packs/day: 0.50  ?  Types: Cigarettes  ? Smokeless tobacco:  Never  ?Vaping Use  ? Vaping Use: Every day  ?Substance and Sexual Activity  ? Alcohol use: Not Currently  ?  Comment: Refuses to disclose how much  ? Drug use: No  ?  Comment: Refuses to answer  ? Sexual activity: Never  ?  Comment: refused to answer  ?Other Topics Concern  ? Not on file  ?Social History Narrative  ? Not on file  ? ?Social Determinants of Health  ? ?Financial Resource Strain: Not on file  ?Food Insecurity: Not on file  ?Transportation Needs: Not on file  ?Physical Activity: Not on file  ?Stress: Not on file  ?Social Connections: Not on file  ? ?Additional Social History:  ?  ?  ?  ?  ?  ?  ?  ?  ?  ?  ?  ? ?Sleep: Fair ? ?Appetite:  Fair ? ?Current Medications: ?Current Facility-Administered Medications  ?Medication Dose Route Frequency Provider Last Rate Last Admin  ? (feeding supplement) PROSource Plus liquid 30 mL  30 mL Oral BID BM Ferd Horrigan T, MD   30 mL at 11/02/21 0909  ? acetaminophen (TYLENOL) tablet 650 mg  650 mg Oral Q6H PRN Khaleem Burchill T, MD      ? alum & mag hydroxide-simeth (MAALOX/MYLANTA) 200-200-20 MG/5ML suspension 30 mL  30 mL Oral Q4H PRN Wendall Isabell T, MD      ? benztropine (COGENTIN) tablet 0.5 mg  0.5 mg Oral BID PRN Marinna Blane, [name], MD   0.5 mg at  11/02/21 0909  ? clonazePAM (KLONOPIN) disintegrating tablet 0.25 mg  0.25 mg Oral BID Princeton Nabor T, MD   0.25 mg at 11/02/21 9509  ? haloperidol (HALDOL) tablet 5 mg  5 mg Oral BID Aydan Levitz T, MD   5 mg at 11/02/21 0909  ? magnesium hydroxide (MILK OF MAGNESIA) suspension 30 mL  30 mL Oral Daily PRN Paije Goodhart, [name], MD      ? menthol-cetylpyridinium (CEPACOL) lozenge 3 mg  1 lozenge Oral PRN Latavion Halls, [name], MD      ? midodrine (PROAMATINE) tablet 2.5 mg  2.5 mg Oral TID WC Burlin Mcnair T, MD   2.5 mg at 11/02/21 1220  ? mirtazapine (REMERON SOL-TAB) disintegrating tablet 7.5 mg  7.5 mg Oral QHS Sueanne Maniaci T, MD   7.5 mg at 11/01/21 2135  ? multivitamin with minerals tablet 1 tablet  1 tablet Oral Daily  Davanta Meuser, [name], MD   1 tablet at 11/02/21 3267  ? OLANZapine zydis (ZYPREXA) disintegrating tablet 5 mg  5 mg Oral QHS [name], DO   5 mg at 11/01/21 2135  ? thiamine tablet 100 mg  100 mg Oral Daily Lajuan Kovaleski, [name], MD   100 mg at 11/02/21 1245  ? traZODone (DESYREL) tablet 50 mg  50 mg Oral QHS PRN Lorri Fukuhara, [name], MD      ? ? ?Lab Results: No results found for this or any previous visit (from the past 48 hour(s)). ? ?Blood Alcohol level:  ?Lab Results  ?Component Value Date  ? ETH <10 09/12/2021  ? ETH <10 10/01/2020  ? ? ?Metabolic Disorder Labs: ?Lab Results  ?Component Value Date  ? HGBA1C 5.6 10/21/2021  ? MPG 114 10/21/2021  ? MPG 111 10/02/2021  ? ?Lab Results  ?Component Value Date  ? PROLACTIN 13.6 04/16/2015  ? ?Lab Results  ?Component Value Date  ? CHOL 189 10/21/2021  ? TRIG 52 10/21/2021  ? HDL 50 10/21/2021  ? CHOLHDL 3.8 10/21/2021  ? VLDL 10 10/21/2021  ? LDLCALC 129 (H) 10/21/2021  ? LDLCALC 93 10/01/2020  ? ? ?Physical Findings: ?AIMS:  , ,  ,  ,    ?CIWA:    ?COWS:    ? ?Musculoskeletal: ?Strength & Muscle Tone: within normal limits ?Gait & Station: normal ?Patient leans: [name] ? ?Psychiatric Specialty Exam: ? ?Presentation  ?General Appearance: Casual; Appropriate for Environment; Fairly Groomed ? ?Eye Contact:Fair ? ?Speech:Slow ? ?Speech Volume:Decreased ? ?Handedness:Right ? ? ?Mood and Affect  ?Mood:Anxious; Depressed ? ?Affect:Blunt ? ? ?Thought Process  ?Thought Processes:Disorganized ? ?Descriptions of Associations:Loose ? ?Orientation:Partial ? ?Thought Content:Delusions; Illogical; Paranoid Ideation ? ?History of Schizophrenia/Schizoaffective disorder:Yes ? ?Duration of Psychotic Symptoms:Greater than six months ? ?Hallucinations:No data recorded ?Ideas of Reference:Paranoia ? ?Suicidal Thoughts:No data recorded ?Homicidal Thoughts:No data recorded ? ?Sensorium  ?Memory:Recent Poor; Remote Poor ? ?Judgment:Impaired ? ?Insight:Fair ? ? ?Executive Functions   ?Concentration:Poor ? ?Attention Span:Poor ? ?Recall:Poor ? ?Fund of Knowledge:Poor ? ?Language:Fair ? ? ?Psychomotor Activity  ?Psychomotor Activity:No data recorded ? ?Assets  ?Assets:Communication Skills; Resilience; Financial Resources/Insurance; Desire for Improvement; Physical Health; Social Support ? ? ?Sleep  ?Sleep:No data recorded ? ? ?Physical Exam: ?Physical Exam ?Vitals and nursing note reviewed.  ?Constitutional:   ?   Appearance: Normal appearance.  ?HENT:  ?   Head: Normocephalic and atraumatic.  ?   Mouth/Throat:  ?   Pharynx: Oropharynx is clear.  ?Eyes:  ?   Pupils: Pupils are equal, round, and reactive to  light.  ?Cardiovascular:  ?   Rate and Rhythm: Normal rate and regular rhythm.  ?Pulmonary:  ?   Effort: Pulmonary effort is normal.  ?   Breath sounds: Normal breath sounds.  ?Abdominal:  ?   General: Abdomen is flat.  ?   Palpations: Abdomen is soft.  ?Musculoskeletal:     ?   General: Normal range of motion.  ?Skin: ?   General: Skin is warm and dry.  ?Neurological:  ?   General: No focal deficit present.  ?   Mental Status: She is alert. Mental status is at baseline.  ?Psychiatric:     ?   Attention and Perception: Attention normal.     ?   Mood and Affect: Mood normal. Affect is blunt.     ?   Speech: Speech is delayed.     ?   Behavior: Behavior is slowed.     ?   Thought Content: Thought content normal.     ?   Cognition and Memory: Cognition is impaired.  ? ?Review of Systems  ?Constitutional: Negative.   ?HENT: Negative.    ?Eyes: Negative.   ?Respiratory: Negative.    ?Cardiovascular: Negative.   ?Gastrointestinal: Negative.   ?Musculoskeletal: Negative.   ?Skin: Negative.   ?Neurological: Negative.   ?Psychiatric/Behavioral: Negative.    ?Blood pressure 109/71, pulse 64, temperature 98.1 ?F (36.7 ?C), resp. rate 20, height 5\' 4"  (1.626 m), weight 44.5 kg, SpO2 100 %. Body mass index is 16.82 kg/m?. ? ? ?Treatment Plan Summary: ?Medication management and Plan appears to be stable with  no active behavior problems.  There is still been some concern about her safety at discharge but treatment team will continue to work on discharge options.  Psychoeducation and encouragement provided to patient ? ?Everardo AllJohn C

## 2021-11-02 NOTE — Group Note (Signed)
LCSW Group Therapy Note ? ? ?Group Date: 11/02/2021 ?Start Time: 1300 ?End Time: 1400 ? ? ?Type of Therapy and Topic:  Group Therapy: Boundaries ? ?Participation Level:  Did Not Attend ? ?Description of Group: ?This group will address the use of boundaries in their personal lives. Patients will explore why boundaries are important, the difference between healthy and unhealthy boundaries, and negative and postive outcomes of different boundaries and will look at how boundaries can be crossed.  Patients will be encouraged to identify current boundaries in their own lives and identify what kind of boundary is being set. Facilitators will guide patients in utilizing problem-solving interventions to address and correct types boundaries being used and to address when no boundary is being used. Understanding and applying boundaries will be explored and addressed for obtaining and maintaining a balanced life. Patients will be encouraged to explore ways to assertively make their boundaries and needs known to significant others in their lives, using other group members and facilitator for role play, support, and feedback. ? ?Therapeutic Goals: ? ?1.  Patient will identify areas in their life where setting clear boundaries could be  used to improve their life.  ?2.  Patient will identify signs/triggers that a boundary is not being respected. ?3.  Patient will identify two ways to set boundaries in order to achieve balance in  their lives: ?4.  Patient will demonstrate ability to communicate their needs and set boundaries  through discussion and/or role plays ? ?Summary of Patient Progress:  Patient did not attend group despite encouraged participation.  ? ?Therapeutic Modalities:   ?Cognitive Behavioral Therapy ?Solution-Focused Therapy ? ?Corky Crafts, LCSWA ?11/02/2021  3:44 PM   ? ?

## 2021-11-03 NOTE — Plan of Care (Signed)
°  Problem: Group Participation °Goal: STG - Patient will engage in groups without prompting or encouragement from LRT x3 group sessions within 5 recreation therapy group sessions °Description: STG - Patient will engage in groups without prompting or encouragement from LRT x3 group sessions within 5 recreation therapy group sessions °Outcome: Progressing °  °

## 2021-11-03 NOTE — Plan of Care (Signed)
Pt continues to present Flat and Isolative. Pt denies SI/HI/Avh and contracts for safety.  Pt denies Depression and Anxiety. Patient did not participate in therapeutic milieu.  Pt did not eat night snack.  Pt did verbalize restful sleep. Encouragement and support offered.  Pt verbalized importance of compliance with treatment plan.  Q 15 minute safety checks in place.  Informed patient to notify nursing staff if needs arise.   ? ? ?Problem: Education: ?Goal: Knowledge of [facility] Education information/materials will improve ?Outcome: Progressing ?Goal: Mental status will improve ?Outcome: Progressing ?  ?Problem: Activity: ?Goal: Interest or engagement in activities will improve ?Outcome: Progressing ?  ?

## 2021-11-03 NOTE — BHH Counselor (Signed)
CSW contacted the [facility], [phone] ext: [phone] to set up appointment for pt. Staff member informed CSW that pt was in the system but does not have a PCP and would need to establish services with eligibility before being able to set up a mental health services appointment. CSW was provided with eligibility phone extension, [phone] ext: [phone]. CSW will provide guardian, [name] with information regarding follow up to assist pt with obtaining appointment. Staff member stated that when pt obtains eligibility, she can attend walk-in hours at their mental health clinic, Monday- Friday 8am-4pm.  ? ?No other requests were made. Conversation ended without incident.  ? ?Percy Comp [place], MSW, LCSW-A ?4/17/20234:43 PM  ?

## 2021-11-03 NOTE — Progress Notes (Signed)
Recreation Therapy Notes ? ?Date: 11/03/2021 ?  ?Time: 1:30 pm  ?  ?Location: Craft room    ?  ?Behavioral response: [name] ?  ?Intervention Topic: Time Management     ?  ?Discussion/Intervention: ?Patient refused to attend group.  ?  ?Clinical Observations/Feedback:  ?Patient refused to attend group.  ?  ?Chatara Lucente LRT/CTRS ? ? ? ? ? ? ? ?Ruthe Roemer ?11/03/2021 1:36 PM ?

## 2021-11-03 NOTE — Plan of Care (Signed)
Patient remain alert and oriented, calm and cooperative during assessment. Denies anxiety, depression, pain or discomfort at this time. Denies SI, HI, and AVH. Ate meals in the day room among staff and peers with good appetite. Compliant with all due medications. Currently in her room. Remain safe on the unit with Q 15 minute safety check. ? ? ?Problem: Education: ?Goal: Knowledge of [place] [facility] Education information/materials will improve ?Outcome: Progressing ?Goal: Mental status will improve ?Outcome: Progressing ?  ?Problem: Activity: ?Goal: Interest or engagement in activities will improve ?Outcome: Progressing ?  ?

## 2021-11-03 NOTE — BHH Group Notes (Signed)
14:00 Group: chair yoga- pt present , not active but alert and oriented ?

## 2021-11-03 NOTE — Progress Notes (Signed)
[facility] MD Progress Note ? ?11/03/2021 11:34 AM ?[name]  ?MRN:  [id number] ?Subjective:  [name] is seen on rounds today.  She is pleasant and cooperative and compliant with her medications.  She denies any [name] effects.  She has been much better since being compliant with medications.  She denies any depression, or auditory or visual hallucinations.  Social work is setting up outpatient appointment at the [place].  She should be ready for discharge this week. ? ?Principal Problem: Schizophrenia, undifferentiated (HCC) ?Diagnosis: Principal Problem: ?  Schizophrenia, undifferentiated (HCC) ? ?Total Time spent with patient: 15 minutes ? ?Past Psychiatric History: Schizophrenia: She was hospitalized at [facility] in March 2022 and September 2021.  Patient has had multiple psychiatric hospitalizations in the past.  She was at [facility] hospital in 2020 secondary to similar circumstances.  She had been hospitalized apparently at an outside facility [facility]) for 4 weeks prior to being transferred to [facility].  Outpatient at the [facility]. ? ?Past Medical History:  ?Past Medical History:  ?Diagnosis Date  ? Anemia 10/02/2021  ? Non compliance w medication regimen   ? Schizophrenia (HCC)   ?  ?Past Surgical History:  ?Procedure Laterality Date  ? BIOPSY  09/25/2021  ? Procedure: BIOPSY;  Surgeon: Rachael Fee, MD;  Location: [facility];  Service: Gastroenterology;;  ? ESOPHAGOGASTRODUODENOSCOPY (EGD) WITH PROPOFOL [name] 09/25/2021  ? Procedure: ESOPHAGOGASTRODUODENOSCOPY (EGD) WITH PROPOFOL;  Surgeon: Rachael Fee, MD;  Location: [facility];  Service: Gastroenterology;  Laterality: [name];  With NGT placement  ? ?Family History: History reviewed. No pertinent family history. ? ?Social History:  ?Social History  ? ?Substance and Sexual Activity  ?Alcohol Use Not Currently  ? Comment: Refuses to disclose how much  ?   ?Social History  ? ?Substance and Sexual Activity   ?Drug Use No  ? Comment: Refuses to answer  ?  ?Social History  ? ?Socioeconomic History  ? Marital status: Single  ?  Spouse name: Not on file  ? Number of children: Not on file  ? Years of education: Not on file  ? Highest education level: Not on file  ?Occupational History  ? Not on file  ?Tobacco Use  ? Smoking status: Some Days  ?  Packs/day: 0.50  ?  Types: Cigarettes  ? Smokeless tobacco: Never  ?Vaping Use  ? Vaping Use: Every day  ?Substance and Sexual Activity  ? Alcohol use: Not Currently  ?  Comment: Refuses to disclose how much  ? Drug use: No  ?  Comment: Refuses to answer  ? Sexual activity: Never  ?  Comment: refused to answer  ?Other Topics Concern  ? Not on file  ?Social History Narrative  ? Not on file  ? ?Social Determinants of Health  ? ?Financial Resource Strain: Not on file  ?Food Insecurity: Not on file  ?Transportation Needs: Not on file  ?Physical Activity: Not on file  ?Stress: Not on file  ?Social Connections: Not on file  ? ?Additional Social History:  ?  ?  ?  ?  ?  ?  ?  ?  ?  ?  ?  ? ?Sleep: Good ? ?Appetite:  Good ? ?Current Medications: ?Current Facility-Administered Medications  ?Medication Dose Route Frequency Provider Last Rate Last Admin  ? (feeding supplement) PROSource Plus liquid 30 mL  30 mL Oral BID BM [name], [name], MD   30 mL at 11/03/21 0914  ? acetaminophen (TYLENOL) tablet  650 mg  650 mg Oral Q6H PRN [name], Jackquline DenmarkJohn T, MD      ? alum & mag hydroxide-simeth (MAALOX/MYLANTA) 200-200-20 MG/5ML suspension 30 mL  30 mL Oral Q4H PRN [name], [name], MD      ? benztropine (COGENTIN) tablet 0.5 mg  0.5 mg Oral BID PRN [name], Jackquline DenmarkJohn T, MD   0.5 mg at 11/02/21 [id number]  ? clonazePAM (KLONOPIN) disintegrating tablet 0.25 mg  0.25 mg Oral BID [name], [name], MD   0.25 mg at 11/03/21 [id number]  ? haloperidol (HALDOL) tablet 5 mg  5 mg Oral BID [name], [name], MD   5 mg at 11/03/21 [id number]  ? magnesium hydroxide (MILK OF MAGNESIA) suspension 30 mL  30 mL Oral Daily PRN [name], Jackquline DenmarkJohn T, MD       ? menthol-cetylpyridinium (CEPACOL) lozenge 3 mg  1 lozenge Oral PRN [name], Jackquline DenmarkJohn T, MD      ? midodrine (PROAMATINE) tablet 2.5 mg  2.5 mg Oral TID WC [name], Jackquline DenmarkJohn T, MD   2.5 mg at 11/03/21 0754  ? mirtazapine (REMERON SOL-TAB) disintegrating tablet 7.5 mg  7.5 mg Oral QHS [name], [name], MD   7.5 mg at 11/02/21 2114  ? multivitamin with minerals tablet 1 tablet  1 tablet Oral Daily [name], Jackquline DenmarkJohn T, MD   1 tablet at 11/03/21 0914  ? OLANZapine zydis (ZYPREXA) disintegrating tablet 5 mg  5 mg Oral QHS Sarina IllHerrick, Kotaro Buer Edward, DO   5 mg at 11/02/21 2115  ? thiamine tablet 100 mg  100 mg Oral Daily [name], Jackquline DenmarkJohn T, MD   100 mg at 11/03/21 0914  ? traZODone (DESYREL) tablet 50 mg  50 mg Oral QHS PRN [name], Jackquline DenmarkJohn T, MD      ? ? ?Lab Results: No results found for this or any previous visit (from the past 48 hour(s)). ? ?Blood Alcohol level:  ?Lab Results  ?Component Value Date  ? ETH <10 09/12/2021  ? ETH <10 10/01/2020  ? ? ?Metabolic Disorder Labs: ?Lab Results  ?Component Value Date  ? HGBA1C 5.6 10/21/2021  ? MPG 114 10/21/2021  ? MPG 111 10/02/2021  ? ?Lab Results  ?Component Value Date  ? PROLACTIN 13.6 04/16/2015  ? ?Lab Results  ?Component Value Date  ? CHOL 189 10/21/2021  ? TRIG 52 10/21/2021  ? HDL 50 10/21/2021  ? CHOLHDL 3.8 10/21/2021  ? VLDL 10 10/21/2021  ? LDLCALC 129 (H) 10/21/2021  ? LDLCALC 93 10/01/2020  ? ? ?Physical Findings: ?AIMS:  , ,  ,  ,    ?CIWA:    ?COWS:    ? ?Musculoskeletal: ?Strength & Muscle Tone: within normal limits ?Gait & Station: normal ?Patient leans: [name] ? ?Psychiatric Specialty Exam: ? ?Presentation  ?General Appearance: Casual; Appropriate for Environment; Fairly Groomed ? ?Eye Contact:Fair ? ?Speech:Slow ? ?Speech Volume:Decreased ? ?Handedness:Right ? ? ?Mood and Affect  ?Mood:Anxious; Depressed ? ?Affect:Blunt ? ? ?Thought Process  ?Thought Processes:Disorganized ? ?Descriptions of Associations:Loose ? ?Orientation:Partial ? ?Thought Content:Delusions; Illogical;  Paranoid Ideation ? ?History of Schizophrenia/Schizoaffective disorder:Yes ? ?Duration of Psychotic Symptoms:Greater than six months ? ?Hallucinations:No data recorded ?Ideas of Reference:Paranoia ? ?Suicidal Thoughts:No data recorded ?Homicidal Thoughts:No data recorded ? ?Sensorium  ?Memory:Recent Poor; Remote Poor ? ?Judgment:Impaired ? ?Insight:Fair ? ? ?Executive Functions  ?Concentration:Poor ? ?Attention Span:Poor ? ?Recall:Poor ? ?Fund of Knowledge:Poor ? ?Language:Fair ? ? ?Psychomotor Activity  ?Psychomotor Activity:No data recorded ? ?Assets  ?Assets:Communication Skills; Resilience; Financial Resources/Insurance; Desire for Improvement; Physical Health; Social Support ? ? ?  Sleep  ?Sleep:No data recorded ? ? ?Physical Exam: ?Physical Exam ?Vitals and nursing note reviewed.  ?Constitutional:   ?   Appearance: Normal appearance. She is normal weight.  ?Neurological:  ?   General: No focal deficit present.  ?   Mental Status: She is alert and oriented to person, place, and time.  ?Psychiatric:     ?   Attention and Perception: Attention and perception normal.     ?   Mood and Affect: Mood and affect normal.     ?   Speech: Speech normal.     ?   Behavior: Behavior normal. Behavior is cooperative.     ?   Thought Content: Thought content normal.     ?   Cognition and Memory: Cognition and memory normal.     ?   Judgment: Judgment normal.  ? ?Review of Systems  ?Constitutional: Negative.   ?HENT: Negative.    ?Eyes: Negative.   ?Respiratory: Negative.    ?Cardiovascular: Negative.   ?Gastrointestinal: Negative.   ?Genitourinary: Negative.   ?Musculoskeletal: Negative.   ?Skin: Negative.   ?Neurological: Negative.   ?Endo/Heme/Allergies: Negative.   ?Psychiatric/Behavioral: Negative.    ?Blood pressure (!) 106/50, pulse (!) 53, temperature (!) 97.5 ?F (36.4 ?C), temperature source Oral, resp. rate 20, height 5\' 4"  (1.626 m), weight 44.5 kg, SpO2 100 %. Body mass index is 16.82 kg/m?. ? ? ?Treatment Plan  Summary: ?Daily contact with patient to assess and evaluate symptoms and progress in treatment, Medication management, and Plan continue current medications. ? ? , DO ?11/03/2021, 11:34 AM ? ?

## 2021-11-04 NOTE — BH IP Treatment Plan (Signed)
Interdisciplinary Treatment and Diagnostic Plan Update ? ?11/04/2021 ?Time of Session: 9:30AM ?[name] ?MRN: [id number] ? ?Principal Diagnosis: Schizophrenia, undifferentiated ([place]) ? ?Secondary Diagnoses: Principal Problem: ?  Schizophrenia, undifferentiated ([place]) ? ? ?Current Medications:  ?Current Facility-Administered Medications  ?Medication Dose Route Frequency Provider Last Rate Last Admin  ? (feeding supplement) PROSource Plus liquid 30 mL  30 mL Oral BID BM [name], [name], MD   30 mL at 11/03/21 1404  ? acetaminophen (TYLENOL) tablet 650 mg  650 mg Oral Q6H PRN [name], [name], MD      ? alum & mag hydroxide-simeth (MAALOX/MYLANTA) 200-200-20 MG/5ML suspension 30 mL  30 mL Oral Q4H PRN [name], [name], MD      ? benztropine (COGENTIN) tablet 0.5 mg  0.5 mg Oral BID PRN [name], [name], MD   0.5 mg at 11/02/21 [id number]  ? clonazePAM (KLONOPIN) disintegrating tablet 0.25 mg  0.25 mg Oral BID [name], [name], MD   0.25 mg at 11/03/21 2102  ? haloperidol (HALDOL) tablet 5 mg  5 mg Oral BID [name], [name], MD   5 mg at 11/03/21 2101  ? magnesium hydroxide (MILK OF MAGNESIA) suspension 30 mL  30 mL Oral Daily PRN [name], [name], MD      ? menthol-cetylpyridinium (CEPACOL) lozenge 3 mg  1 lozenge Oral PRN [name], [name], MD      ? midodrine (PROAMATINE) tablet 2.5 mg  2.5 mg Oral TID WC [name], [name], MD   2.5 mg at 11/04/21 [id number]  ? mirtazapine (REMERON SOL-TAB) disintegrating tablet 7.5 mg  7.5 mg Oral QHS [name], [name], MD   7.5 mg at 11/03/21 2101  ? multivitamin with minerals tablet 1 tablet  1 tablet Oral Daily [name], [name], MD   1 tablet at 11/03/21 0914  ? OLANZapine zydis (ZYPREXA) disintegrating tablet 5 mg  5 mg Oral QHS Parks Ranger, DO   5 mg at 11/03/21 2103  ? thiamine tablet 100 mg  100 mg Oral Daily [name], [name], MD   100 mg at 11/03/21 0914  ? traZODone (DESYREL) tablet 50 mg  50 mg Oral QHS PRN [name], [name], MD      ? ?PTA Medications: ?Medications Prior to Admission   ?Medication Sig Dispense Refill Last Dose  ? benztropine (COGENTIN) 0.5 MG tablet Take 1 tablet (0.5 mg total) by mouth 2 (two) times daily as needed for tremors.     ? clonazePAM (KLONOPIN) 0.5 MG tablet Place 1 tablet (0.5 mg total) into feeding tube 2 (two) times daily. 30 tablet 0   ? haloperidol (HALDOL) 5 MG tablet Take 1 tablet (5 mg total) by mouth 2 (two) times daily.     ? midodrine (PROAMATINE) 2.5 MG tablet Take 1 tablet (2.5 mg total) by mouth 3 (three) times daily with meals.  0   ? mirtazapine (REMERON SOL-TAB) 15 MG disintegrating tablet Take 0.5 tablets (7.5 mg total) by mouth at bedtime. 30 tablet 0   ? Multiple Vitamin (MULTIVITAMIN WITH MINERALS) TABS tablet Take 1 tablet by mouth daily.     ? thiamine 100 MG tablet Take 1 tablet (100 mg total) by mouth daily.     ? traZODone (DESYREL) 50 MG tablet Take 1 tablet (50 mg total) by mouth at bedtime as needed for sleep. 30 tablet 0   ? ? ?Patient Stressors: Health problems   ? ?Patient Strengths: Other: Unable to determined at this time ? ?Treatment Modalities: Medication Management, Group  therapy, Case management,  ?1 to 1 session with clinician, Psychoeducation, Recreational therapy. ? ? ?Physician Treatment Plan for Primary Diagnosis: Schizophrenia, undifferentiated (HCC) ?Long Term Goal(s): Improvement in symptoms so as ready for discharge  ? ?Short Term Goals: Ability to disclose and discuss suicidal ideas ?Ability to identify and develop effective coping behaviors will improve ?Ability to verbalize feelings will improve ?Ability to maintain clinical measurements within normal limits will improve ?Compliance with prescribed medications will improve ? ?Medication Management: Evaluate patient's response, side effects, and tolerance of medication regimen. ? ?Therapeutic Interventions: 1 to 1 sessions, Unit Group sessions and Medication administration. ? ?Evaluation of Outcomes: Adequate for Discharge ? ?Physician Treatment Plan for Secondary  Diagnosis: Principal Problem: ?  Schizophrenia, undifferentiated (HCC) ? ?Long Term Goal(s): Improvement in symptoms so as ready for discharge  ? ?Short Term Goals: Ability to disclose and discuss suicidal ideas ?Ability to identify and develop effective coping behaviors will improve ?Ability to verbalize feelings will improve ?Ability to maintain clinical measurements within normal limits will improve ?Compliance with prescribed medications will improve    ? ?Medication Management: Evaluate patient's response, side effects, and tolerance of medication regimen. ? ?Therapeutic Interventions: 1 to 1 sessions, Unit Group sessions and Medication administration. ? ?Evaluation of Outcomes: Adequate for Discharge ? ? ?RN Treatment Plan for Primary Diagnosis: Schizophrenia, undifferentiated (HCC) ?Long Term Goal(s): Knowledge of disease and therapeutic regimen to maintain health will improve ? ?Short Term Goals: Ability to remain free from injury will improve, Ability to verbalize frustration and anger appropriately will improve, Ability to demonstrate self-control, Ability to participate in decision making will improve, Ability to verbalize feelings will improve, Ability to identify and develop effective coping behaviors will improve, and Compliance with prescribed medications will improve ? ?Medication Management: RN will administer medications as ordered by provider, will assess and evaluate patient's response and provide education to patient for prescribed medication. RN will report any adverse and/or side effects to prescribing provider. ? ?Therapeutic Interventions: 1 on 1 counseling sessions, Psychoeducation, Medication administration, Evaluate responses to treatment, Monitor vital signs and CBGs as ordered, Perform/monitor CIWA, COWS, AIMS and Fall Risk screenings as ordered, Perform wound care treatments as ordered. ? ?Evaluation of Outcomes: Adequate for Discharge ? ? ?LCSW Treatment Plan for Primary Diagnosis:  Schizophrenia, undifferentiated (HCC) ?Long Term Goal(s): Safe transition to appropriate next level of care at discharge, Engage patient in therapeutic group addressing interpersonal concerns. ? ?Short Term Goals: Engage patient in aftercare planning with referrals and resources, Increase social support, Increase ability to appropriately verbalize feelings, Increase emotional regulation, Facilitate acceptance of mental health diagnosis and concerns, Facilitate patient progression through stages of change regarding substance use diagnoses and concerns, and Increase skills for wellness and recovery ? ?Therapeutic Interventions: Assess for all discharge needs, 1 to 1 time with Child psychotherapistocial worker, Explore available resources and support systems, Assess for adequacy in community support network, Educate family and significant other(s) on suicide prevention, Complete Psychosocial Assessment, Interpersonal group therapy. ? ?Evaluation of Outcomes: Adequate for Discharge ? ? ?Progress in Treatment: ?Attending groups: No. ?Participating in groups: No. ?Taking medication as prescribed: Yes. ?Toleration medication: Yes. ?Family/Significant other contact made: Yes, individual(s) contacted:  SPE completed with pt's guardian [name] Burgin-Lennon ?Patient understands diagnosis: No. ?Discussing patient identified problems/goals with staff: No. ?Medical problems stabilized or resolved: Yes. ?Denies suicidal/homicidal ideation: Yes. ?Issues/concerns per patient self-inventory: No. ?Other: None ? ?New problem(s) identified: No, Describe:  None ? ?New Short Term/Long Term Goal(s): Patient to work towards elimination of symptoms  of psychosis, medication management for mood stabilization; development of comprehensive mental wellness plan. Update 10/25/2021: No changes at this time. Update 10/30/21: No changes at this time. Update 11/04/21: No changes at this time.  ?  ?  ?Patient Goals:  Patient simply states, "want to go home." Update 10/25/2021:  No changes at this time. Update 10/30/21: No changes at this time. Update 11/04/21: No changes at this time.  ?  ?Discharge Plan or Barriers: No psychosocial barriers identified at this time, assuming patient wil

## 2021-11-04 NOTE — Progress Notes (Signed)
Patient was in her bedroom at the beginning of the shift. She is calm and pleasant. She denies anxiety, depression, SI, HI, and AVH. She is medication complaint. Patient's BP runs low. Pt was encouraged to drink fluids and BP was re-checked. Pt is safe on the unit at this time. Q 15 minute safety checks in place.  ?

## 2021-11-04 NOTE — Progress Notes (Signed)
[facility] MD Progress Note ? ?11/04/2021 11:45 AM ?[name]  ?MRN:  [id number] ?Subjective: Patient is seen on rounds.  She has been compliant with her medications and her mood is good.  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  She received ECT initially and was transferred to geriatric psychiatry.  She has been doing well on her medications and denies any [name] effects.  She denies any suicidal ideation or auditory hallucinations.  She has been eating better and sleeping better. ? ?Principal Problem: Schizophrenia, undifferentiated (HCC) ?Diagnosis: Principal Problem: ?  Schizophrenia, undifferentiated (HCC) ? ?Total Time spent with patient: 15 minutes ? ?Past Psychiatric History: Schizophrenia: She was hospitalized at [facility] in March 2022 and September 2021.  Patient has had multiple psychiatric hospitalizations in the past.  She was at [facility] in 2020 secondary to similar circumstances.  She had been hospitalized apparently at an outside facility [facility]) for 4 weeks prior to being transferred to [facility].  Outpatient at the [facility] ? ?Past Medical History:  ?Past Medical History:  ?Diagnosis Date  ? Anemia 10/02/2021  ? Non compliance w medication regimen   ? Schizophrenia (HCC)   ?  ?Past Surgical History:  ?Procedure Laterality Date  ? BIOPSY  09/25/2021  ? Procedure: BIOPSY;  Surgeon: Rachael Fee, MD;  Location: [facility];  Service: Gastroenterology;;  ? ESOPHAGOGASTRODUODENOSCOPY (EGD) WITH PROPOFOL [name] 09/25/2021  ? Procedure: ESOPHAGOGASTRODUODENOSCOPY (EGD) WITH PROPOFOL;  Surgeon: Rachael Fee, MD;  Location: [facility];  Service: Gastroenterology;  Laterality: [name];  With NGT placement  ? ?Family History: History reviewed. No pertinent family history. ? ?Social History:  ?Social History  ? ?Substance and Sexual Activity  ?Alcohol Use Not Currently  ? Comment: Refuses to disclose how much  ?   ?Social History   ? ?Substance and Sexual Activity  ?Drug Use No  ? Comment: Refuses to answer  ?  ?Social History  ? ?Socioeconomic History  ? Marital status: Single  ?  Spouse name: Not on file  ? Number of children: Not on file  ? Years of education: Not on file  ? Highest education level: Not on file  ?Occupational History  ? Not on file  ?Tobacco Use  ? Smoking status: Some Days  ?  Packs/day: 0.50  ?  Types: Cigarettes  ? Smokeless tobacco: Never  ?Vaping Use  ? Vaping Use: Every day  ?Substance and Sexual Activity  ? Alcohol use: Not Currently  ?  Comment: Refuses to disclose how much  ? Drug use: No  ?  Comment: Refuses to answer  ? Sexual activity: Never  ?  Comment: refused to answer  ?Other Topics Concern  ? Not on file  ?Social History Narrative  ? Not on file  ? ?Social Determinants of Health  ? ?Financial Resource Strain: Not on file  ?Food Insecurity: Not on file  ?Transportation Needs: Not on file  ?Physical Activity: Not on file  ?Stress: Not on file  ?Social Connections: Not on file  ? ?Additional Social History:  ?  ?  ?  ?  ?  ?  ?  ?  ?  ?  ?  ? ?Sleep: Good ? ?Appetite:  Good ? ?Current Medications: ?Current Facility-Administered Medications  ?Medication Dose Route Frequency Provider Last Rate Last Admin  ? (feeding supplement) PROSource Plus liquid 30 mL  30 mL Oral BID BM [name], [name], MD   30 mL at 11/04/21 1019  ?  acetaminophen (TYLENOL) tablet 650 mg  650 mg Oral Q6H PRN [name], [name], MD      ? alum & mag hydroxide-simeth (MAALOX/MYLANTA) 200-200-20 MG/5ML suspension 30 mL  30 mL Oral Q4H PRN [name], [name], MD      ? benztropine (COGENTIN) tablet 0.5 mg  0.5 mg Oral BID PRN [name], [name], MD   0.5 mg at 11/02/21 7035  ? clonazePAM (KLONOPIN) disintegrating tablet 0.25 mg  0.25 mg Oral BID [name], [name], MD   0.25 mg at 11/04/21 1019  ? haloperidol (HALDOL) tablet 5 mg  5 mg Oral BID [name], [name], MD   5 mg at 11/04/21 1019  ? magnesium hydroxide (MILK OF MAGNESIA) suspension 30 mL  30 mL  Oral Daily PRN [name], [name], MD      ? menthol-cetylpyridinium (CEPACOL) lozenge 3 mg  1 lozenge Oral PRN [name], [name], MD      ? midodrine (PROAMATINE) tablet 2.5 mg  2.5 mg Oral TID WC [name], [name], MD   2.5 mg at 11/04/21 1140  ? mirtazapine (REMERON SOL-TAB) disintegrating tablet 7.5 mg  7.5 mg Oral QHS [name], [name], MD   7.5 mg at 11/03/21 2101  ? multivitamin with minerals tablet 1 tablet  1 tablet Oral Daily [name], [name], MD   1 tablet at 11/04/21 1019  ? OLANZapine zydis (ZYPREXA) disintegrating tablet 5 mg  5 mg Oral QHS [name], DO   5 mg at 11/03/21 2103  ? thiamine tablet 100 mg  100 mg Oral Daily [name], [name], MD   100 mg at 11/04/21 1019  ? traZODone (DESYREL) tablet 50 mg  50 mg Oral QHS PRN [name], [name], MD      ? ? ?Lab Results: No results found for this or any previous visit (from the past 48 hour(s)). ? ?Blood Alcohol level:  ?Lab Results  ?Component Value Date  ? ETH <10 09/12/2021  ? ETH <10 10/01/2020  ? ? ?Metabolic Disorder Labs: ?Lab Results  ?Component Value Date  ? HGBA1C 5.6 10/21/2021  ? MPG 114 10/21/2021  ? MPG 111 10/02/2021  ? ?Lab Results  ?Component Value Date  ? PROLACTIN 13.6 04/16/2015  ? ?Lab Results  ?Component Value Date  ? CHOL 189 10/21/2021  ? TRIG 52 10/21/2021  ? HDL 50 10/21/2021  ? CHOLHDL 3.8 10/21/2021  ? VLDL 10 10/21/2021  ? LDLCALC 129 (H) 10/21/2021  ? LDLCALC 93 10/01/2020  ? ? ?Physical Findings: ?AIMS:  , ,  ,  ,    ?CIWA:    ?COWS:    ? ?Musculoskeletal: ?Strength & Muscle Tone: within normal limits ?Gait & Station: normal ?Patient leans: [name] ? ?Psychiatric Specialty Exam: ? ?Presentation  ?General Appearance: Casual; Appropriate for Environment; Fairly Groomed ? ?Eye Contact:Fair ? ?Speech:Slow ? ?Speech Volume:Decreased ? ?Handedness:Right ? ? ?Mood and Affect  ?Mood:Anxious; Depressed ? ?Affect:Blunt ? ? ?Thought Process  ?Thought Processes:Disorganized ? ?Descriptions of  Associations:Loose ? ?Orientation:Partial ? ?Thought Content:Delusions; Illogical; Paranoid Ideation ? ?History of Schizophrenia/Schizoaffective disorder:Yes ? ?Duration of Psychotic Symptoms:Greater than six months ? ?Hallucinations:No data recorded ?Ideas of Reference:Paranoia ? ?Suicidal Thoughts:No data recorded ?Homicidal Thoughts:No data recorded ? ?Sensorium  ?Memory:Recent Poor; Remote Poor ? ?Judgment:Impaired ? ?Insight:Fair ? ? ?Executive Functions  ?Concentration:Poor ? ?Attention Span:Poor ? ?Recall:Poor ? ?Fund of Knowledge:Poor ? ?Language:Fair ? ? ?Psychomotor Activity  ?Psychomotor Activity:No data recorded ? ?Assets  ?Assets:Communication Skills; Resilience; Financial Resources/Insurance; Desire for Improvement; Physical Health;  Social Support ? ? ?Sleep  ?Sleep:No data recorded ? ? ?Physical Exam: ?Physical Exam ?Vitals and nursing note reviewed.  ?Constitutional:   ?   Appearance: Normal appearance. She is normal weight.  ?Neurological:  ?   General: No focal deficit present.  ?   Mental Status: She is alert and oriented to person, place, and time.  ?Psychiatric:     ?   Attention and Perception: Attention and perception normal.     ?   Mood and Affect: Mood and affect normal.     ?   Speech: Speech normal.     ?   Behavior: Behavior normal. Behavior is cooperative.     ?   Thought Content: Thought content normal.     ?   Cognition and Memory: Cognition and memory normal.     ?   Judgment: Judgment normal.  ? ?Review of Systems  ?Constitutional: Negative.   ?HENT: Negative.    ?Eyes: Negative.   ?Respiratory: Negative.    ?Cardiovascular: Negative.   ?Gastrointestinal: Negative.   ?Genitourinary: Negative.   ?Musculoskeletal: Negative.   ?Skin: Negative.   ?Neurological: Negative.   ?Endo/Heme/Allergies: Negative.   ?Psychiatric/Behavioral: Negative.    ?Blood pressure (!) 99/59, pulse (!) 56, temperature 97.8 ?F (36.6 ?C), temperature source Oral, resp. rate 18, height 5\' 4"  (1.626 m), weight  44.5 kg, SpO2 100 %. Body mass index is 16.82 kg/m?. ? ? ?Treatment Plan Summary: ?Daily contact with patient to assess and evaluate symptoms and progress in treatment, Medication management, and Plan continue current medications. ? ?Sarina Illichard Edward Radie Berges,

## 2021-11-04 NOTE — Progress Notes (Signed)
Patient is calm and cooperative.She denies SI, HI, AVH, depression, anxiety and pain. Patient is isolative and interacts minimally with others when in the dayroom. She ate breakfast and lunch in the dayroom. Participated in group therapy this morning. Patient is compliant with scheduled medications. Patient remains safe on the unit at this time with Q 15 minute safety checks. ?

## 2021-11-04 NOTE — BHH Counselor (Signed)
CSW contacted guardian,[name] [phone] to discuss discharge planning. She stated that she would be available at 3pm to provide transportation home for pt.  ?CSW informed guardian of eligibility before receiving services at the [place] and provided the contact number to reach facility.  ?She requested that medication prescriptions be sent to [facility] on  [address], [place], [place] [address].  ? ?No other requests were made. Conversation ending without incident.  ? ?Tan Clopper [place], MSW, LCSW-A ?4/18/20231:51 PM ?  ?

## 2021-11-05 MED ORDER — OLANZAPINE 5 MG PO TABS: 5.0000 mg | ORAL_TABLET | Freq: Every day | ORAL | 2 refills | Status: DC

## 2021-11-05 MED ORDER — HALOPERIDOL 5 MG PO TABS: 5.0000 mg | ORAL_TABLET | Freq: Two times a day (BID) | ORAL | 3 refills | Status: DC

## 2021-11-05 MED ORDER — CLONAZEPAM 0.5 MG PO TABS: 0.2500 mg | ORAL_TABLET | Freq: Two times a day (BID) | ORAL | 0 refills | Status: DC

## 2021-11-05 MED ORDER — MIRTAZAPINE 15 MG PO TABS: 7.5000 mg | ORAL_TABLET | Freq: Every day | ORAL | 2 refills | Status: DC

## 2021-11-05 NOTE — Progress Notes (Signed)
Recreation Therapy Notes ? ?Date: 11/05/2021 ?  ?Time: 1:35 pm  ?  ?Location: Craft room    ?  ?Behavioral response: Appropriate ?  ?Intervention Topic: Self-esteem   ?  ?Discussion/Intervention:  ?Group content today was focused on self-esteem. Patient defined self-esteem and where it comes form. The group described reasons self-esteem is important. Individuals stated things that impact self-esteem and positive ways to improve self-esteem. The group participated in the intervention ?Collage of Me? where patients were able to create a collage of positive things that makes them who they are. ?Clinical Observations/Feedback: ?Patient came to group and defined self-esteem as the ability to have confidence in what you do and how you do it. She stated that her cooking is a quality she likes about herself. Individual was social with peers and staff while participating in the intervention.    ?Amy Belloso LRT/CTRS  ?  ?  ? ? ? ? ? ? ? ?Coco Sharpnack ?11/05/2021 2:39 PM ?

## 2021-11-05 NOTE — Progress Notes (Signed)
?  [facility] Adult Case Management Discharge Plan : ? ?Will you be returning to the same living situation after discharge:  Yes,  pt will be returning home ?At discharge, do you have transportation home?: Yes,  pt's guardian/sister is providing transportation ?Do you have the ability to pay for your medications: Yes,  pt has VA insurance ? ?Release of information consent forms completed and in the chart;  Patient's signature needed at discharge. ? ?Patient to Follow up at: ? Follow-up Information   ? ? [facility], [facility] Follow up.   ?Why: Please go to the elegibility office to establish care at the [facility]. You can attend walk-in hours at the mental health clinic Monday-Friday 8am-4pm to schedule a follow up appointment. Thanks! ?Contact information: ?[address] ?[place] [address] ?[phone] ? ? ?  ?  ? ?  ?  ? ?  ? ? ?Next level of care provider has access to [facility] Link:no ? ?Safety Planning and Suicide Prevention discussed: Yes,  completed with pt's guardian [name] ? ?  ? ?Has patient been referred to the Quitline?: Patient refused referral ? ?Patient has been referred for addiction treatment: [name] ? ?Jennel Mara A [place], LCSWA ?11/05/2021, 9:32 AM ?

## 2021-11-05 NOTE — Progress Notes (Signed)
Recreation Therapy Notes ? ?INPATIENT RECREATION TR PLAN ? ?Patient Details ?Name: [name] ?MRN: [id number] ?DOB: 1961-01-28 ?Today's Date: 11/05/2021 ? ?Rec Therapy Plan ?Is patient appropriate for Therapeutic Recreation?: Yes ?Treatment times per week: at least 3 ?Estimated Length of Stay: 5-7 days ?TR Treatment/Interventions: Group participation (Comment) ? ?Discharge Criteria ?Pt will be discharged from therapy if:: Discharged ?Treatment plan/goals/alternatives discussed and agreed upon by:: Patient/family ? ?Discharge Summary ?Short term goals set: Patient will engage in groups without prompting or encouragement from LRT x3 group sessions within 5 recreation therapy group sessions ?Short term goals met: Complete ?Progress toward goals comments: Groups attended ?Which groups?: Self-esteem, Social skills, AAA/T, Leisure education, Other (Comment) (Relaxation) ?Reason goals not met: [name] ?Therapeutic equipment acquired: [name] ?Reason patient discharged from therapy: Discharge from hospital ?Pt/family agrees with progress & goals achieved: Yes ?Date patient discharged from therapy: 11/05/21 ? ? ?Kirt Chew ?11/05/2021, 2:48 PM ?

## 2021-11-05 NOTE — Progress Notes (Signed)
Patient alert and oriented x 4. Affect is pleasant and calm. Denies anxiety, SI/HI or AVH. Patient states she will try to keep herself safe when she returns home. ?Reviewed discharge instructions with patient including scheduling upcoming appointment with provider, medication and prescriptions.  Questions answered and understanding verbalized by patient. Discharge packet given to patient. All belongings returned to patient after verification completed by staff.   ?Patient escorted by staff off unit at this time stable without complaint.  ?

## 2021-11-05 NOTE — Progress Notes (Signed)
Pt lying in bed with eyes closed; easily aroused when name called. Pt states that she feels "alright". She currently denies pain, SI/HI/AVH, anxiety and depression. She describes her sleep and appetite as "fine". No acute distress noted. ?

## 2021-11-05 NOTE — BHH Group Notes (Signed)
[facility] Group Notes:  (Nursing/MHT/Case Management/Adjunct) ? ?Date:  11/05/2021  ?Time:  10:00 AM ? ?Type of Therapy:  Psychoeducational Skills ? ?Participation Level:  Active ? ?Participation Quality:  Appropriate ? ?Affect:  Appropriate ? ?Cognitive:  Appropriate ? ?Insight:  Appropriate ? ?Engagement in Group:  Engaged ? ?Modes of Intervention:  Discussion ? ?Summary of Progress/Problems: ?The pt attended group but had a minimal level of participation. ?[name] ?11/05/2021, 11:15 AM ?

## 2021-11-05 NOTE — Progress Notes (Signed)
Patient is calm and cooperative.She denies SI, HI, AVH, depression, anxiety and pain. Patient is isolative and interacts minimally with others when in the dayroom. Participated in group therapy this morning. Patient is compliant with medications. Ate breakfast and is currently eating lunch in the day room. Patient is scheduled to be discharged home today. Patient remains safe on the unit at this time with Q 15 minute safety checks. ?

## 2021-11-05 NOTE — Plan of Care (Signed)
?  Problem: Group Participation ?Goal: STG - Patient will engage in groups without prompting or encouragement from LRT x3 group sessions within 5 recreation therapy group sessions ?Description: STG - Patient will engage in groups without prompting or encouragement from LRT x3 group sessions within 5 recreation therapy group sessions ?Outcome: Completed/Met ?  ?

## 2021-11-05 NOTE — Discharge Summary (Signed)
Physician Discharge Summary Note ? ?Patient:  [name] is an 61 y.o., female ?MRN:  [id number] ?DOB:  1961-06-01 ?Patient phone:  [phone] (home)  ?Patient address:   ?[address] ?[place] [address],  ?Total Time spent with patient: 1 hour ? ?Date of Admission:  10/17/2021 ?Date of Discharge: 11/05/2021 ? ?Reason for Admission:    IIsis [name] is a 61 y.o. female with medical history significant of schizophrenia who was brought into the ED on 2/24 with IVC from family indicating that she has not been taking her medications, hearing voices and having hallucination and delusional thoughts.. Patient seen and chart reviewed.  Spoke with patient's sister and guardian.  Patient says she thinks she is doing okay.  She is eating and has been able to get up and move around the room.  Patient denies having any hallucinations.  She denies any psychiatric symptoms.  She does seem somewhat blunted in her affect and when I talked with her about her home life she showed some signs of possible paranoia.  Seemed overly concerned about how people might take her belongings away from her at her apartment.  I spoke with her sister who admits that she herself has not spoken with the patient.  She expresses concern however that the patient be able to stay on her medicine and be functioning as well. Patient was initially admitted on medical floor due to poor nutrition and was eventually transferred to [facility]. Patient presents now in a passive manner, cannot really provide a coherent history and states that something was causing " Interference" at home. She is denying current SI/HI or any AVH. She appears to have some negative symptoms and paranoia. She is taking her meds here .  ? ?Principal Problem: Schizophrenia, undifferentiated (HCC) ?Discharge Diagnoses: Principal Problem: ?  Schizophrenia, undifferentiated (HCC) ? ? ?Past Psychiatric History: Schizophrenia: She was hospitalized at [facility] in March 2022 and September 2021.  Patient has had multiple psychiatric hospitalizations in the past.  She was at [facility] hospital in 2020 secondary to similar circumstances.  She had been hospitalized apparently at an outside facility [facility]) for 4 weeks prior to being transferred to [facility].  Outpatient at the [facility]. ? ?Past Medical History:  ?Past Medical History:  ?Diagnosis Date  ? Anemia 10/02/2021  ? Non compliance w medication regimen   ? Schizophrenia (HCC)   ?  ?Past Surgical History:  ?Procedure Laterality Date  ? BIOPSY  09/25/2021  ? Procedure: BIOPSY;  Surgeon: Rachael Fee, MD;  Location: [facility];  Service: Gastroenterology;;  ? ESOPHAGOGASTRODUODENOSCOPY (EGD) WITH PROPOFOL [name] 09/25/2021  ? Procedure: ESOPHAGOGASTRODUODENOSCOPY (EGD) WITH PROPOFOL;  Surgeon: Rachael Fee, MD;  Location: [facility];  Service: Gastroenterology;  Laterality: [name];  With NGT placement  ? ?Family History: History reviewed. No pertinent family history. ?Family Psychiatric  History: Unremarkable ?Social History:  ?Social History  ? ?Substance and Sexual Activity  ?Alcohol Use Not Currently  ? Comment: Refuses to disclose how much  ?   ?Social History  ? ?Substance and Sexual Activity  ?Drug Use No  ? Comment: Refuses to answer  ?  ?Social History  ? ?Socioeconomic History  ? Marital status: Single  ?  Spouse name: Not on file  ? Number of children: Not on file  ? Years of education: Not on file  ? Highest education level: Not on file  ?Occupational History  ? Not on file  ?Tobacco Use  ?  Smoking status: Some Days  ?  Packs/day: 0.50  ?  Types: Cigarettes  ? Smokeless tobacco: Never  ?Vaping Use  ? Vaping Use: Every day  ?Substance and Sexual Activity  ? Alcohol use: Not Currently  ?  Comment: Refuses to disclose how much  ? Drug use: No  ?  Comment: Refuses to answer  ? Sexual activity: Never  ?  Comment: refused to answer  ?Other Topics Concern  ? Not on  file  ?Social History Narrative  ? Not on file  ? ?Social Determinants of Health  ? ?Financial Resource Strain: Not on file  ?Food Insecurity: Not on file  ?Transportation Needs: Not on file  ?Physical Activity: Not on file  ?Stress: Not on file  ?Social Connections: Not on file  ? ? ?Hospital Course: Teren was involuntarily admitted to geriatric psychiatry after transfer from the medical unit for catatonia in which she received ECT.  She improved to the point where she was eating and drinking again and was transferred to psychiatry.  On admission she was pleasant and cooperative.  Her home medications were restarted and Zyprexa 5 mg was added at bedtime for psychosis and appetite.  She did well on her medications.  She participated in groups and therapy.  Her mood and affect improved and she was eating drinking and sleeping well.  It was felt that she maximized hospitalization she was discharged home.  On the day of discharge she denied suicidal ideation, homicidal ideation, auditory or visual hallucinations.  Her judgment and insight were good. ? ?Physical Findings: ?AIMS:  , ,  ,  ,    ?CIWA:    ?COWS:    ? ?Musculoskeletal: ?Strength & Muscle Tone: within normal limits ?Gait & Station: normal ?Patient leans: [name] ? ? ?Psychiatric Specialty Exam: ? ?Presentation  ?General Appearance: Casual; Appropriate for Environment; Fairly Groomed ? ?Eye Contact:Fair ? ?Speech:Slow ? ?Speech Volume:Decreased ? ?Handedness:Right ? ? ?Mood and Affect  ?Mood:Anxious; Depressed ? ?Affect:Blunt ? ? ?Thought Process  ?Thought Processes:Disorganized ? ?Descriptions of Associations:Loose ? ?Orientation:Partial ? ?Thought Content:Delusions; Illogical; Paranoid Ideation ? ?History of Schizophrenia/Schizoaffective disorder:Yes ? ?Duration of Psychotic Symptoms:Greater than six months ? ?Hallucinations:No data recorded ?Ideas of Reference:Paranoia ? ?Suicidal Thoughts:No data recorded ?Homicidal Thoughts:No data recorded ? ?Sensorium   ?Memory:Recent Poor; Remote Poor ? ?Judgment:Impaired ? ?Insight:Fair ? ? ?Executive Functions  ?Concentration:Poor ? ?Attention Span:Poor ? ?Recall:Poor ? ?Fund of Knowledge:Poor ? ?Language:Fair ? ? ?Psychomotor Activity  ?Psychomotor Activity:No data recorded ? ?Assets  ?Assets:Communication Skills; Resilience; Financial Resources/Insurance; Desire for Improvement; Physical Health; Social Support ? ? ?Sleep  ?Sleep:No data recorded ? ? ?Physical Exam: ?Physical Exam ?Vitals and nursing note reviewed.  ?Constitutional:   ?   Appearance: Normal appearance. She is normal weight.  ?Neurological:  ?   General: No focal deficit present.  ?   Mental Status: She is alert and oriented to person, place, and time.  ?Psychiatric:     ?   Attention and Perception: Attention and perception normal.     ?   Mood and Affect: Mood and affect normal.     ?   Speech: Speech normal.     ?   Behavior: Behavior normal. Behavior is cooperative.     ?   Thought Content: Thought content normal.     ?   Cognition and Memory: Cognition and memory normal.     ?   Judgment: Judgment normal.  ? ?Review of Systems  ?Constitutional: Negative.   ?HENT:  Negative.    ?Eyes: Negative.   ?Respiratory: Negative.    ?Cardiovascular: Negative.   ?Gastrointestinal: Negative.   ?Genitourinary: Negative.   ?Musculoskeletal: Negative.   ?Skin: Negative.   ?Neurological: Negative.   ?Endo/Heme/Allergies: Negative.   ?Psychiatric/Behavioral: Negative.    ?Blood pressure (!) 102/52, pulse (!) 51, temperature 97.8 ?F (36.6 ?C), temperature source Oral, resp. rate 20, height 5\' 4"  (1.626 m), weight 44.5 kg, SpO2 100 %. Body mass index is 16.82 kg/m?. ? ? ?Social History  ? ?Tobacco Use  ?Smoking Status Some Days  ? Packs/day: 0.50  ? Types: Cigarettes  ?Smokeless Tobacco Never  ? ?Tobacco Cessation:  [name], patient does not currently use tobacco products ? ? ?Blood Alcohol level:  ?Lab Results  ?Component Value Date  ? ETH <10 09/12/2021  ? ETH <10 10/01/2020   ? ? ?Metabolic Disorder Labs:  ?Lab Results  ?Component Value Date  ? HGBA1C 5.6 10/21/2021  ? MPG 114 10/21/2021  ? MPG 111 10/02/2021  ? ?Lab Results  ?Component Value Date  ? PROLACTIN 13.6 04/16/2015  ? ?Lab

## 2021-11-05 NOTE — BHH Suicide Risk Assessment (Signed)
[facility] Discharge Suicide Risk Assessment ? ? ?Principal Problem: Schizophrenia, undifferentiated (HCC) ?Discharge Diagnoses: Principal Problem: ?  Schizophrenia, undifferentiated (HCC) ? ? ?Total Time spent with patient: 1 hour ? ?Musculoskeletal: ?Strength & Muscle Tone: within normal limits ?Gait & Station: normal ?Patient leans: [name] ? ?Psychiatric Specialty Exam ? ?Presentation  ?General Appearance: Casual; Appropriate for Environment; Fairly Groomed ? ?Eye Contact:Fair ? ?Speech:Slow ? ?Speech Volume:Decreased ? ?Handedness:Right ? ? ?Mood and Affect  ?Mood:Anxious; Depressed ? ?Duration of Depression Symptoms: Greater than two weeks ? ?Affect:Blunt ? ? ?Thought Process  ?Thought Processes:Disorganized ? ?Descriptions of Associations:Loose ? ?Orientation:Partial ? ?Thought Content:Delusions; Illogical; Paranoid Ideation ? ?History of Schizophrenia/Schizoaffective disorder:Yes ? ?Duration of Psychotic Symptoms:Greater than six months ? ?Hallucinations:No data recorded ?Ideas of Reference:Paranoia ? ?Suicidal Thoughts:No data recorded ?Homicidal Thoughts:No data recorded ? ?Sensorium  ?Memory:Recent Poor; Remote Poor ? ?Judgment:Impaired ? ?Insight:Fair ? ? ?Executive Functions  ?Concentration:Poor ? ?Attention Span:Poor ? ?Recall:Poor ? ?Fund of Knowledge:Poor ? ?Language:Fair ? ? ?Psychomotor Activity  ?Psychomotor Activity:No data recorded ? ?Assets  ?Assets:Communication Skills; Resilience; Financial Resources/Insurance; Desire for Improvement; Physical Health; Social Support ? ? ?Sleep  ?Sleep:No data recorded ? ? ?Blood pressure (!) 102/52, pulse (!) 51, temperature 97.8 ?F (36.6 ?C), temperature source Oral, resp. rate 20, height 5\' 4"  (1.626 m), weight 44.5 kg, SpO2 100 %. Body mass index is 16.82 kg/m?. ? ?Mental Status Per Nursing Assessment::   ?On Admission:  NA ? ?Demographic Factors:  ?NA ? ?Loss Factors: ?NA ? ?Historical Factors: ?NA ? ?Risk Reduction Factors:   ?Positive social support ? ?Continued  Clinical Symptoms:  ?Previous Psychiatric Diagnoses and Treatments ? ?Cognitive Features That Contribute To Risk:  ?None   ? ?Suicide Risk:  ?Minimal: No identifiable suicidal ideation.  Patients presenting with no risk factors but with morbid ruminations; may be classified as minimal risk based on the severity of the depressive symptoms ? ? Follow-up Information   ? ? [facility].   ?Why: Please go to the elegibility office to establish care at the [facility]. You can attend walk-in hours at the mental health clinic Monday-Friday 8am-4pm to schedule a follow up appointment. Thanks! ?Contact information: ?[address] ?[place] ?[address] ? ? ?  ?  ? ?  ?  ? ?  ? ? ?Plan Of Care/Follow-up recommendations: [facility] ? ? ?[phone], DO ?11/05/2021, 10:48 AM ?

## 2022-02-25 ENCOUNTER — Other Ambulatory Visit: Payer: Self-pay | Admitting: *Deleted

## 2022-02-25 NOTE — Patient Outreach (Signed)
  Care Coordination   Initial Visit Note   02/25/2022 Name: [name] MRN: [id number] DOB: 12-19-1960  [name] is a 61 y.o. year old female [facility]. I spoke with pt's sister and legal guardian, [name], [name] today.  What matters to the patients health and wellness today?  Our family is having difficulty managing my sister at home and we need to come up with a plan to care for her needs, probable placement in an appropriate mental health LTC facility.  Sister, [name], will call me back if she decides to work with NP. [facility] was a resource that was shared.  SDOH assessments and interventions completed:  No{THN Tip this will not be part of the note when signed-REQUIRED REPORT FIELD DO NOT DELETE (Optional):27901   Care Coordination Interventions Activated:  No  Care Coordination Interventions:  Yes, provided   Follow up plan: Referral made to [facility] Sister may call back with questions.  Encounter Outcome:  Pt. Visit Completed   Noralyn Pick C. Burgess Estelle, MSN, [facility] Gerontological Nurse Practitioner [facility] Care Management [phone]

## 2022-03-04 ENCOUNTER — Other Ambulatory Visit: Payer: Self-pay | Admitting: *Deleted

## 2022-03-04 NOTE — Patient Outreach (Signed)
Care Coordination   03/04/2022 Name: [name] MRN: [id number] DOB: 1961/05/06   Care Coordination Outreach Attempts:  A second unsuccessful outreach was attempted today to offer the patient with information about available care coordination services as a benefit of their health plan.     Follow Up Plan:  Additional outreach attempts will be made to offer the patient care coordination information and services.   Encounter Outcome:  No Answer  Care Coordination Interventions Activated:  No   Care Coordination Interventions:  No, not indicated    Naji Mehringer C. Burgess Estelle, MSN, [facility] Gerontological Nurse Practitioner [facility] Care Management [phone]

## 2022-03-17 ENCOUNTER — Telehealth: Payer: Self-pay | Admitting: *Deleted

## 2022-03-17 NOTE — Patient Outreach (Signed)
  Care Coordination   03/17/2022 Name: Kiley Solimine MRN: [id number] DOB: [birth year]   Care Coordination Outreach Attempts:  A third unsuccessful outreach was attempted today to offer the patient with information about available care coordination services as a benefit of their health plan.   Follow Up Plan:  No further outreach attempts will be made at this time. We have been unable to contact the patient to offer or enroll patient in care coordination services  Encounter Outcome:  No Answer  Care Coordination Interventions Activated:  No   Care Coordination Interventions:  No, not indicated    Irving Shows [facility], BSN RN Care Coordinator [phone]

## 2022-03-29 ENCOUNTER — Encounter (HOSPITAL_COMMUNITY): Payer: Self-pay | Admitting: Emergency Medicine

## 2022-03-29 ENCOUNTER — Emergency Department (HOSPITAL_COMMUNITY)
Admission: EM | Admit: 2022-03-29 | Discharge: 2022-04-08 | Disposition: A | Discharge status: Other Acute Inpatient Hospital | Payer: Medicare Other | Attending: Emergency Medicine | Admitting: Emergency Medicine

## 2022-03-29 DIAGNOSIS — F200 Paranoid schizophrenia: Secondary | ICD-10-CM | POA: Diagnosis present

## 2022-03-29 DIAGNOSIS — F29 Unspecified psychosis not due to a substance or known physiological condition: Secondary | ICD-10-CM | POA: Diagnosis not present

## 2022-03-29 DIAGNOSIS — Z046 Encounter for general psychiatric examination, requested by authority: Secondary | ICD-10-CM | POA: Diagnosis present

## 2022-03-29 DIAGNOSIS — Z20822 Contact with and (suspected) exposure to covid-19: Secondary | ICD-10-CM | POA: Insufficient documentation

## 2022-03-29 LAB — CBC WITH DIFFERENTIAL/PLATELET
Abs Immature Granulocytes: 0 10*3/uL (ref 0.00–0.07)
Basophils Absolute: 0 10*3/uL (ref 0.0–0.1)
Basophils Relative: 0 %
Eosinophils Absolute: 0 10*3/uL (ref 0.0–0.5)
Eosinophils Relative: 0 %
HCT: 38.3 % (ref 36.0–46.0)
Hemoglobin: 12.4 g/dL (ref 12.0–15.0)
Immature Granulocytes: 0 %
Lymphocytes Relative: 22 %
Lymphs Abs: 1 10*3/uL (ref 0.7–4.0)
MCH: 28 pg (ref 26.0–34.0)
MCHC: 32.4 g/dL (ref 30.0–36.0)
MCV: 86.5 fL (ref 80.0–100.0)
Monocytes Absolute: 0.3 10*3/uL (ref 0.1–1.0)
Monocytes Relative: 7 %
Neutro Abs: 3.3 10*3/uL (ref 1.7–7.7)
Neutrophils Relative %: 71 %
Platelets: 202 10*3/uL (ref 150–400)
RBC: 4.43 MIL/uL (ref 3.87–5.11)
RDW: 14 % (ref 11.5–15.5)
WBC: 4.7 10*3/uL (ref 4.0–10.5)
nRBC: 0 % (ref 0.0–0.2)

## 2022-03-29 LAB — COMPREHENSIVE METABOLIC PANEL
ALT: 27 U/L (ref 0–44)
AST: 27 U/L (ref 15–41)
Albumin: 4.5 g/dL (ref 3.5–5.0)
Alkaline Phosphatase: 62 U/L (ref 38–126)
Anion gap: 6 (ref 5–15)
BUN: 21 mg/dL (ref 8–23)
CO2: 26 mmol/L (ref 22–32)
Calcium: 9.1 mg/dL (ref 8.9–10.3)
Chloride: 108 mmol/L (ref 98–111)
Creatinine, Ser: 0.87 mg/dL (ref 0.44–1.00)
GFR, Estimated: >60 mL/min (ref >60)
Glucose, Bld: 115 mg/dL — ABNORMAL HIGH (ref 70–99)
Potassium: 3.6 mmol/L (ref 3.5–5.1)
Sodium: 140 mmol/L (ref 135–145)
Total Bilirubin: 0.6 mg/dL (ref 0.3–1.2)
Total Protein: 7.6 g/dL (ref 6.5–8.1)

## 2022-03-29 LAB — RESP PANEL BY RT-PCR (FLU A&B, COVID) ARPGX2
Influenza A by PCR: NEGATIVE
Influenza B by PCR: NEGATIVE
SARS Coronavirus 2 by RT PCR: NEGATIVE

## 2022-03-29 LAB — MAGNESIUM: Magnesium: 2.2 mg/dL (ref 1.7–2.4)

## 2022-03-29 LAB — ETHANOL: Alcohol, Ethyl (B): <10 mg/dL (ref <10)

## 2022-03-29 LAB — PHOSPHORUS: Phosphorus: 3.5 mg/dL (ref 2.5–4.6)

## 2022-03-29 LAB — CBG MONITORING, ED: Glucose-Capillary: 111 mg/dL — ABNORMAL HIGH (ref 70–99)

## 2022-03-29 MED ORDER — HALOPERIDOL 5 MG PO TABS
5.0000 mg | ORAL_TABLET | Freq: Two times a day (BID) | ORAL | Status: DC
  Filled 2022-03-29 (×2): qty 1

## 2022-03-29 MED ORDER — THIAMINE MONONITRATE 100 MG PO TABS
100.0000 mg | ORAL_TABLET | Freq: Every day | ORAL | Status: DC
  Filled 2022-03-29: qty 1

## 2022-03-29 MED ORDER — CLONAZEPAM 0.5 MG PO TABS
0.5000 mg | ORAL_TABLET | Freq: Two times a day (BID) | ORAL | Status: DC
  Filled 2022-03-29 (×2): qty 1

## 2022-03-29 MED ORDER — MIRTAZAPINE 7.5 MG PO TABS
7.5000 mg | ORAL_TABLET | Freq: Every day | ORAL | Status: DC
  Filled 2022-03-29 (×3): qty 1

## 2022-03-29 MED ORDER — OLANZAPINE 5 MG PO TABS
5.0000 mg | ORAL_TABLET | Freq: Every day | ORAL | Status: DC
  Filled 2022-03-29: qty 1

## 2022-03-29 MED ORDER — ADULT MULTIVITAMIN W/MINERALS CH
1.0000 | ORAL_TABLET | Freq: Every day | ORAL | Status: DC
  Filled 2022-03-29: qty 1

## 2022-03-29 MED ORDER — MIDODRINE HCL 2.5 MG PO TABS
2.5000 mg | ORAL_TABLET | Freq: Three times a day (TID) | ORAL | Status: DC
  Filled 2022-03-29 (×15): qty 1

## 2022-03-29 MED ORDER — NICOTINE 7 MG/24HR TD PT24: 7.0000 mg | MEDICATED_PATCH | Freq: Every day | TRANSDERMAL | Status: DC

## 2022-03-29 NOTE — ED Notes (Signed)
Pt belongings, shirt, pants and shoes placed into belonging bag and placed into triage pt belonging cabinet.
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
## 2022-03-29 NOTE — ED Notes (Signed)
Pt advises being unable to provide urine sample at this time, will monitor. 

## 2022-03-29 NOTE — ED Notes (Signed)
Attempted to contact legal guardian x2 without answer.
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
## 2022-03-30 DIAGNOSIS — F29 Unspecified psychosis not due to a substance or known physiological condition: Secondary | ICD-10-CM | POA: Diagnosis not present

## 2022-03-30 NOTE — ED Notes (Signed)
Dinner tray at nurses station pt states she doesn't want it now.

## 2022-03-30 NOTE — BH Assessment (Addendum)
Patient remains under review at [facility] for [facility]. Patient under review by Landry Dyke, RN.

## 2022-03-30 NOTE — BH Assessment (Addendum)
Per shift report, TTS noted that per Dr. Gasper Sells, pt needs a TOC referral to [facility] for possible eating d/o tx and possibly ECT. @2107 , requested psychiatry (Dr. ) and [facility] TTS staff [facility]) to review patient for admission to their facility. Patient is under review at this time for [facility].  Also, faxed referrals to the following hospitals for consideration of inpatient treatment.  Destination Service Provider Request Status Selected Services Address Phone Fax Patient Preferred  [facility]  Pending - Request Sent [name] [address] [place] [phone] --  [facility]  Pending - Request Sent [name] [address] [place] [phone] --  [facility]  Pending - Request Sent [name] [address] [place] [address] [phone] --  [facility]  Pending - Request Sent [name] [address] [place] [phone] --  [facility]  Pending - Request Sent [name] [address] [place] [phone] --  [facility]  Pending - Request Sent [name] [address] [place], [place] [phone] --  [facility]  Pending - Request Sent [name] [address] [place] [phone] --  [facility]  Pending - Request Sent [name] [address] [place] [address] [phone] --  [facility] [address]  Pending - Request Sent [name] [phone] [place] [phone] --  [facility]  Pending - Request Sent [name] [address], [address] [place] [phone] --  [facility]  Pending - Request Sent [name] [address] [place] [phone] --  [facility]  Pending - Request Sent [name] [address], [place] [address] [place] [address] [phone] --  [facility]  Pending - Request Sent [name] [address] [facility] [place] [phone] --  [facility]  Pending - Request Sent [name] [address] [place] [phone] --  [facility]  Pending - Request Sent [name] [address] [place] [phone] --  [facility]  Pending - Request Sent [name] 2131 [address]132 [address] [place] [phone] --  [facility]  Pending - Request Sent [name] [address] [place] [phone] --  [facility]  Pending - Request Sent [name] [address], [place] [phone] --  [facility]  Pending - Request Sent [name] [address], [place] [phone] --  [facility]  Pending - Request Sent [name] [address] [place] [phone] --  [facility]  Pending - Request Sent [name] [address] [place] [phone] --  [facility]  Pending - Request Sent [name] [address] [place] [address] [phone] --  [facility]  Pending - Request Sent [name] [address] [place], [place] [phone] --  [facility]  Pending - Request Sent [name] [address], [place] [phone] --  [facility]  Pending - Request Sent [name] [address] [place], [place] [phone] --  [facility]  Pending - Request [facility] [address] [facility] [place] [phone] --  [facility]  Pending - Request Sent [name] [address] [place], [place] [address] [phone] --

## 2022-03-30 NOTE — ED Notes (Signed)
Pt refused vitals 

## 2022-03-30 NOTE — ED Notes (Signed)
Pt sitting in chair. Pt offered food and drink but refused.

## 2022-03-30 NOTE — ED Notes (Signed)
Pt refusing to take am meds. Pt states "I don't want nothing, I just wanna go home."  Pt also given breakfast tray but pt refusing to eat.  Pt denies needing anything when asked this am.  Pt just continues to states "I just wanna go home."  Pt sitting in chair in room rocking back and forth.

## 2022-03-30 NOTE — ED Notes (Signed)
Pt did not eat her lunch.

## 2022-03-30 NOTE — ED Provider Notes (Signed)
Emergency Medicine Observation Re-evaluation Note  Bethania Prezioso is a 61 y.o. female w/ Past medical history significant for schizophrenia, anemia, medical noncompliance, acute psychosis, tobacco abuse, protein calorie malnutrition,  seen on rounds today.  Pt initially presented to the ED for complaints of IVC Currently, the patient is angry refusing to answer most questions, stating that she does not want to be here.  Physical Exam  BP 123/79 (BP Location: Left Arm)   Pulse (!) 52   Temp (!) 97.4 F (36.3 C) (Oral)   Resp 16   SpO2 100%  Physical Exam General: Sitting up in a chair Cardiac: Well-perfused Lungs: No increased work of breathing Psych: Irritable, noncooperative  ED Course / MDM  EKG:   I have reviewed the labs performed to date as well as medications administered while in observation.  Recent changes in the last 24 hours include CMP remarkable for glucose 115, otherwise within normal limits, no leukocytosis or anemia, negative EtOH, negative influenza A/B and COVID.  Patient is refusing to take a.m. meds stating that she just wants to go home.  Plan  Current plan is for inpatient psychiatric placement.    Loetta Rough, MD 03/30/22 1200

## 2022-03-30 NOTE — ED Notes (Signed)
Tele psych pushed in room. Patient starts cursing at staff and then talking to self in room.

## 2022-03-30 NOTE — ED Notes (Signed)
Pt refusing dinner tray.  Pt continues to state "I want to go home."

## 2022-03-30 NOTE — BH Assessment (Addendum)
@  2206, Tamara from [facility], states that their facility will accept patient only if she starts to consume food/liquid. On-coming staff to reach out to [facility] with status improvements.

## 2022-03-30 NOTE — BH Assessment (Signed)
Samantha from [facility] declined patient because they are at capacity.

## 2022-03-30 NOTE — BH Assessment (Addendum)
Patient's guardian is listed as (Dorman-Lennon,[name]) and mobile [phone]. Clinician attempted to contact patient's guardian @ 2117 to confirm guardianship status.   Clinician confirmed verbally that [name] is patient's legal guardian. Also, confirmed that patient has legal guardianship documentation in Epic, as of 03/05/2020. Oriris, confirmed that the guardianship documentation from the date noted is the most recent.  [name], was further provided updates on patient's disposition plan. 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 Clinician requesting oncoming staff to seek placement for patient at the [facility] in the am.

## 2022-03-30 NOTE — ED Notes (Signed)
Patient standing in corner in room talking to self
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
## 2022-03-30 NOTE — ED Notes (Signed)
Pt would not take oral medications

## 2022-03-30 NOTE — Consult Note (Signed)
  Patient seen and assessed this morning by therapeutic triage service.  Psychiatric provider attempted to reassess patient, however she continues to present with disorganized thought processes, persecutory delusions, paranoia, and psychosis.  Patient does not allow anyone to enter into her room, and she remains paranoid.  She is observed to be pacing the floor, walking back and forth in a circle, chanting and shouting at people who were not there.  She does mention " being on their soil and taking back her land.  I have problems at home, I cannot do anything about them if I am here.  I must go fight!"  Patient was becoming visibly upset and irate.  In order to prevent worsening agitation of acutely decompensated schizophrenic patient, this nurse practitioner terminated the interview. Will continue to follow   -Patient will likely benefit from inpatient psychiatric admission. -Thus far she has refused her psychotropic medications to include Haldol, Zyprexa, Klonopin, mirtazapine. Will initiate conversation with legal guardian regarding need for possible IM medications, consent to treat for ECT, and medicine over objection if necessary. Unsuccessful in reaching her at this time. Successfully spoke to her mother who also reports being unsuccessful in reaching her.  She states if she does speak with osiris, will let her know that the hospital is trying to reach her. -Patient under review at [facility].  -

## 2022-03-30 NOTE — ED Notes (Signed)
Pt's legal guardian called ED.  ED RN and Legal guardian discussed pt's status and plan of care.  Per legal guardian, ok to give pt's shots of medications.  Legal guardian notified pt is not eating or taking medications.

## 2022-03-31 DIAGNOSIS — F29 Unspecified psychosis not due to a substance or known physiological condition: Secondary | ICD-10-CM | POA: Diagnosis not present

## 2022-03-31 DIAGNOSIS — R9431 Abnormal electrocardiogram [ECG] [EKG]: Secondary | ICD-10-CM | POA: Diagnosis not present

## 2022-03-31 LAB — RAPID URINE DRUG SCREEN, HOSP PERFORMED
Amphetamines: NOT DETECTED
Barbiturates: NOT DETECTED
Benzodiazepines: NOT DETECTED
Cocaine: NOT DETECTED
Opiates: NOT DETECTED
Tetrahydrocannabinol: NOT DETECTED

## 2022-03-31 LAB — TROPONIN I (HIGH SENSITIVITY): Troponin I (High Sensitivity): 5 ng/L (ref <18)

## 2022-03-31 LAB — MAGNESIUM: Magnesium: 2.3 mg/dL (ref 1.7–2.4)

## 2022-03-31 MED ORDER — LORAZEPAM 2 MG/ML IJ SOLN
1.0000 mg | Freq: Two times a day (BID) | INTRAMUSCULAR | Status: DC
  Administered 2022-03-31 – 2022-04-08 (×10): 1 mg via INTRAMUSCULAR
  Filled 2022-03-31 (×11): qty 1

## 2022-03-31 MED ORDER — LORAZEPAM 1 MG PO TABS
1.0000 mg | ORAL_TABLET | Freq: Two times a day (BID) | ORAL | Status: DC
  Filled 2022-03-31 (×2): qty 1

## 2022-03-31 MED ORDER — HALOPERIDOL 5 MG PO TABS
5.0000 mg | ORAL_TABLET | Freq: Two times a day (BID) | ORAL | Status: DC
  Filled 2022-03-31 (×2): qty 1

## 2022-03-31 MED ORDER — POTASSIUM CHLORIDE CRYS ER 20 MEQ PO TBCR
40.0000 meq | EXTENDED_RELEASE_TABLET | Freq: Once | ORAL | Status: AC
  Administered 2022-03-31: 40 meq via ORAL
  Filled 2022-03-31: qty 2

## 2022-03-31 MED ORDER — HALOPERIDOL LACTATE 5 MG/ML IJ SOLN
5.0000 mg | Freq: Two times a day (BID) | INTRAMUSCULAR | Status: DC
  Administered 2022-03-31 – 2022-04-08 (×10): 5 mg via INTRAMUSCULAR
  Filled 2022-03-31 (×11): qty 1

## 2022-03-31 NOTE — ED Notes (Signed)
Patient ate her dinner today, only because NA feed her. She was cooperative after she had gotten her medicine.

## 2022-03-31 NOTE — ED Notes (Signed)
Pt states "I do not need to eat I do not need to drink I do not need to sleep as long as I am in this room"

## 2022-03-31 NOTE — ED Notes (Signed)
Pt got herself into bed after falling asleep in her chair and almost falling to the floor.

## 2022-03-31 NOTE — ED Notes (Signed)
Pt refusing to eat. Pt stated "I'm not eating anything, drinking anything, sleeping or taking anything. I'm not suppose to be here." Paramedic notified.

## 2022-03-31 NOTE — ED Notes (Signed)
[facility] came to [facility] ed via police with IVC papers petitioned by [employer]. On 03/29/22 3:34pm  Dr. Benjie Karvonen did the first exam at 13:01pm on 03/30/22.  The IVC papers need to be renewed by 04/05/22 of the AM.

## 2022-03-31 NOTE — ED Provider Notes (Signed)
Emergency Medicine Observation Re-evaluation Note  [name] is a 61 y.o. female, seen on rounds today.  Pt initially presented to the ED for complaints of IVC Currently, the patient is sitting in a chair in the room.  Physical Exam  BP (!) 109/56 (BP Location: Left Arm)   Pulse (!) 51   Temp (!) 97.5 F (36.4 C) (Oral)   Resp 18   SpO2 100%  Physical Exam General: No acute distress  Psych: Angry  ED Course / MDM  EKG:   I have reviewed the labs performed to date as well as medications administered while in observation.  Recent changes in the last 24 hours include patient refusing to eat and drink.  When questioned she states that she does not need to do this.  States that she is not hungry..  Plan  Current plan is for geropsych placement.    Lorre Nick, MD 03/31/22 [phone]

## 2022-03-31 NOTE — Consult Note (Signed)
Patient appears to be extremely agitated and has not been compliant with medication and less encouraged.  She refused all medications for the past two days, and did not take her morning medications.  She is observed to be sitting in the floor, in the dark same position as she was yesterday when this provider attempted to assess patient.  Patient continues to refuse to engage and participate in psychiatric evaluation.  When you attempt to speak to her, she begins to shout in a loud and demeaning manner, her tone is angered and irate at times.  She continues to present with delusions, psychosis, paranoia.  Patient will require nonemergency forced medications, in order to attempt to stabilize.     Patient seen and evaluated on 03/31/22 at 11:03am after staff reported the patient was refusing her medications.   Patient suffers from a significant mental illness (paranoid schizophrenia) which is likely to result in worsening psychosis /poor oral intake/ refusal to eat/ assaults on staff / injury to staff if untreated. There is evidence of significant and prolonged deterioration over the past 2 days.  She has been offered Haldol, Klonopin, mirtazapine, and Zyprexa and refused these medications.  She meets criteria for involuntary forced medication as outlined in 10A NCAC 26D .1104.    Writer spoke with legal Guardian [name] Ishida-Lennon at [phone] on 03/31/22. She is confirmed as her legal guardian. She is updated about her sister and the ongoing refusal of medications, food, sleep and treatment. Discussed with sister the need to provide psychiatric medications for stabilization at this time. Her sister verbalizes understanding by statin g" that is why I sent her there so yall can treat her with medications. She is not taking them at home, and she needs them. "  This nurse practitioner then inquired about sisters ability to provide any tips or helpful information, to convince patient to eat and or take oral  medication.  She reports on the last 2 admissions her sister has refused all oral intake that resulted in a feeding tube.  She is hopeful not for this time. This nurse practitioner provides verbal agreeance, and hopeful that she will not decompensate to the point in which she needs a feeding tube placed.  Writer also discussed possible ECT options, as it has historically worked on her last admission.  Her sister/legal guardian expresses much dislike and ECT, citing cardiac problems that resulted in cardiology consult on her last admission.  She is open to all all treatment at this time, will likely need to revisit ECT if patient requires medical admission.  Did review risks, benefits, side effects of nonemergency forced med order.  Sister again verbalizes her understanding of the process, and need for medication to prevent further decompensation of schizophrenia.  All questions, comments, and concerns have been answered and addressed.  Treatment/Plan:  She still continues to meet inpatient criteria, she also meets criteria for IVC which we can continue to uphold due to her unwillingness to take her medications.  She has previously refuse food and all oral intake, and an attempt to starve herself.  There is evidence that patient may deteriorate while in the emergency room, and may likely need medical admission if she continues to refuse food, medications, and liquids.   Failure to treat her illness would pose an imminent threat including death to the patient if not treated.    *Patient continues to meet inpatient psychiatric criteria.  In the event patient ultimately requires medical admission due to deterioration, deconditioning, poor oral  intake/electrolyte abnormalities will prefer she is admitted to [facility].  Patient remains a viable candidate for ECT.  *Continue with IVC order and Recruitment consultant. *Will place order for EKG once patient allows. *New orders have been placed for Haldol 5 mg p.o. twice daily or  Haldol 5 mg IM twice daily. Order also placed for Ativan 1 mg p.o. twice daily or Ativan 1 mg IM twice daily.  Non-Emergent Forced Med Protocol (03/31/2022, IVC renewed on March 30 2022, has been signed and placed in patient's chart.  Nonemergency forced med protocol is as followed: - Please offer PO and if pt refuses give IM Haldol and Ativan at same dose. These orders are linked in Epic.              - This should be given even in absence of agitation  - Should be held for sedation

## 2022-03-31 NOTE — ED Notes (Signed)
Patient was informed to get into the bed, she keeps falling asleep in the chair NA and staff is afraid she's going to fall out the chair and hurt herself RN was informed as well.

## 2022-04-01 DIAGNOSIS — F29 Unspecified psychosis not due to a substance or known physiological condition: Secondary | ICD-10-CM | POA: Diagnosis not present

## 2022-04-01 LAB — RESP PANEL BY RT-PCR (FLU A&B, COVID) ARPGX2
Influenza A by PCR: NEGATIVE
Influenza B by PCR: NEGATIVE
SARS Coronavirus 2 by RT PCR: NEGATIVE

## 2022-04-01 NOTE — ED Provider Notes (Signed)
Emergency Medicine Observation Re-evaluation Note  [name] is a 61 y.o. female, seen on rounds today.  Pt initially presented to the ED for complaints of psychosis Currently, the patient is sleeping.  Physical Exam  BP (!) 105/54   Pulse (!) 44   Temp 97.8 F (36.6 C) (Axillary)   Resp 14   SpO2 99%  Physical Exam General: Calm, no distress currently Cardiac: Regular rate and rhythm Lungs: No increased work of breathing Psych: Calm  ED Course / MDM  EKG:   I have reviewed the labs performed to date as well as medications administered while in observation.  Recent changes in the last 24 hours include evaluation from psychiatry including notation from most recent cardiology evaluation in regards the patient's candidacy for further ECT.  Pertinent information included below: "Recommendations: Ventricular tachycardia in the setting of ECT treatment: Negative troponin and negative cardiac work-up including echocardiogram and nuclear stress test.  Ejection fraction is normal.  Beta-blockers are not recommended given underlying bradycardia.   The patient is cleared from a cardiac standpoint to resume ECT.  I discussed this with Dr. Toni Amend.".    Plan  Current plan is for ongoing management with behavioral health for appropriate therapy and placement.    Gerhard Munch, MD 04/01/22 [phone]

## 2022-04-01 NOTE — Consult Note (Signed)
[facility] Psych ED Progress Note  04/01/2022 10:59 AM [name]  MRN:  [id number]   Subjective:   On assessment today, patient is seen face-to-face and examined on her bed in the hospital.  Patient was initially as though to be sleeping, while receiving update from nursing staff and safety sitter patient did awake.  She was immediately irritable, began to talk with pressured speech.  Patient remains delusional, psychotic, and paranoid.  At one point in time she does inquire about being held illegally "what am I even here for?  You are holding me against my will.  What did I do to even get here?  "  When patient updated about events leading to her current hospitalization and involuntary commitment, she became very very irate and upset.  She set up in the bed and began to yell offensive terms while referencing her mother [name].  She then called this provider an "youre an asshole for even mentioning her.  Who is she?  You do not even know who she is.  She needs to get out of my house ."  Writer did exit the room to allow patient time to calm down, prior to returning to completing psychiatric reassessment.  Writer also apologized for mentioning the name  "Ivar Drape may " as I see that makes her visibly upset and frustrated.  Patient then refused to talk and engage with this provider.  Prior to terminating Soil scientist did inquire about questions or concerns patient may have.  Writer offered to set up breakfast tray, while reading items off of her meal to get, in which patient continued to refuse.  Throughout the evaluation she continued to reference discharging and going home, continues to lack insight into her ongoing decompensation of schizophrenia.    Chart reviewed and findings shared with the treatment team and consulted with Dr. Gasper Sells. Description of association and thought content appeared tangential and loose.  When asked what brought patient to the hospital, reports that she was brought here  against her will.  Patient does not appear to be responding to internal stimuli during the assessment, however nurse reports at times she does talk to herself or things that are in the room when no one is there.  Patient denies suicidal ideation, homicidal ideation, or auditory/visual hallucinations.  Patient denies sleeping well last night, stating she was forced to go to sleep therefore she did not sleep well.  Chart review shows patient did eat her dinner, however currently refusing breakfast.  Patient remains extremely agitated, has not been compliant with medication.  At this time she remains under a forced medicine order, initiated by her legal guardian in order to receive medications to help with psychiatric stabilization.  She continues to present with delusions, psychosis, paranoia.  Patient will require nonemergency forced medications, in order to attempt to stabilize.      Principal Problem: <principal problem not specified> Diagnosis:  Active Problems:   * No active hospital problems. *   ED Assessment Time Calculation: No data recorded  Past Psychiatric History: Paranoid schizophrenia.  Noncompliance with medication regimen last inpatient admission to [facility] March 2023, for similar presentation as noted above with acute decompensation of schizophrenia, refusal to eat.  At which time patient received ECT therapy, did well after her first session  [place] Scale:  Flowsheet Row Admission (Discharged) from 10/17/2021 in [facility] BEHAVIORAL MEDICINE Admission (Discharged) from 10/02/2021 in [facility] REGIONAL CARDIAC MED PCU ED to Hosp-Admission (Discharged) from 09/12/2021 in  [place] [facility] EAST ONCOLOGY  C-SSRS RISK CATEGORY No Risk No Risk No Risk       Past Medical History:  Past Medical History:  Diagnosis Date   Anemia 10/02/2021   Non compliance w medication regimen    Schizophrenia [facility])     Past Surgical History:  Procedure Laterality Date    BIOPSY  09/25/2021   Procedure: BIOPSY;  Surgeon: Rachael Fee, MD;  Location: [facility];  Service: Gastroenterology;;   ESOPHAGOGASTRODUODENOSCOPY (EGD) WITH PROPOFOL [name] 09/25/2021   Procedure: ESOPHAGOGASTRODUODENOSCOPY (EGD) WITH PROPOFOL;  Surgeon: Rachael Fee, MD;  Location: [facility];  Service: Gastroenterology;  Laterality: [name];  With NGT placement   Family History: History reviewed. No pertinent family history. Family Psychiatric  History: Deniies Social History:  Social History   Substance and Sexual Activity  Alcohol Use Not Currently   Comment: Refuses to disclose how much     Social History   Substance and Sexual Activity  Drug Use No   Comment: Refuses to answer    Social History   Socioeconomic History   Marital status: Single    Spouse name: Not on file   Number of children: Not on file   Years of education: Not on file   Highest education level: Not on file  Occupational History   Not on file  Tobacco Use   Smoking status: Some Days    Packs/day: 0.50    Types: Cigarettes   Smokeless tobacco: Never  Vaping Use   Vaping Use: Every day  Substance and Sexual Activity   Alcohol use: Not Currently    Comment: Refuses to disclose how much   Drug use: No    Comment: Refuses to answer   Sexual activity: Never    Comment: refused to answer  Other Topics Concern   Not on file  Social History Narrative   Not on file   Social Determinants of Health   Financial Resource Strain: Not on file  Food Insecurity: Not on file  Transportation Needs: Not on file  Physical Activity: Not on file  Stress: Not on file  Social Connections: Not on file    Sleep: Good  Appetite:  Fair  Current Medications: Current Facility-Administered Medications  Medication Dose Route Frequency Provider Last Rate Last Admin   haloperidol (HALDOL) tablet 5 mg  5 mg Oral BID Starkes-Perry, Juel Burrow, FNP       Or   haloperidol lactate (HALDOL) injection 5 mg  5 mg  Intramuscular BID Maryagnes Amos, FNP   5 mg at 03/31/22 1450   LORazepam (ATIVAN) tablet 1 mg  1 mg Oral BID Maryagnes Amos, FNP       Or   LORazepam (ATIVAN) injection 1 mg  1 mg Intramuscular BID Maryagnes Amos, FNP   1 mg at 03/31/22 1451   midodrine (PROAMATINE) tablet 2.5 mg  2.5 mg Oral TID WC Sherian Maroon A, PA       mirtazapine (REMERON) tablet 7.5 mg  7.5 mg Oral QHS Sherian Maroon A, PA       multivitamin with minerals tablet 1 tablet  1 tablet Oral Daily Sherian Maroon A, PA       nicotine (NICODERM CQ - dosed in mg/24 hr) patch 7 mg  7 mg Transdermal Daily Sherian Maroon A, PA       thiamine (VITAMIN B1) tablet 100 mg  100 mg Oral Daily Peter Garter, [place]       Current  Outpatient Medications  Medication Sig Dispense Refill   clonazePAM (KLONOPIN) 0.5 MG tablet Take 0.5 tablets (0.25 mg total) by mouth 2 (two) times daily. (Patient not taking: Reported on 03/30/2022) 60 tablet 0   haloperidol (HALDOL) 5 MG tablet Take 1 tablet (5 mg total) by mouth 2 (two) times daily. (Patient not taking: Reported on 03/30/2022) 60 tablet 3   midodrine (PROAMATINE) 2.5 MG tablet Take 1 tablet (2.5 mg total) by mouth 3 (three) times daily with meals. (Patient not taking: Reported on 03/30/2022)  0   mirtazapine (REMERON) 15 MG tablet Take 0.5 tablets (7.5 mg total) by mouth at bedtime. (Patient not taking: Reported on 03/30/2022) 30 tablet 2   Multiple Vitamin (MULTIVITAMIN WITH MINERALS) TABS tablet Take 1 tablet by mouth daily. (Patient not taking: Reported on 03/30/2022)     OLANZapine (ZYPREXA) 5 MG tablet Take 1 tablet (5 mg total) by mouth at bedtime. (Patient not taking: Reported on 03/30/2022) 30 tablet 2   thiamine 100 MG tablet Take 1 tablet (100 mg total) by mouth daily. (Patient not taking: Reported on 03/30/2022)      Lab Results:  Results for orders placed or performed during the hospital encounter of 03/29/22 (from the past 48 hour(s))  Urine rapid drug  screen (hosp performed)     Status: None   Collection Time: 03/31/22 11:44 AM  Result Value Ref Range   Opiates NONE DETECTED NONE DETECTED   Cocaine NONE DETECTED NONE DETECTED   Benzodiazepines NONE DETECTED NONE DETECTED   Amphetamines NONE DETECTED NONE DETECTED   Tetrahydrocannabinol NONE DETECTED NONE DETECTED   Barbiturates NONE DETECTED NONE DETECTED    Comment: (NOTE) DRUG SCREEN FOR MEDICAL PURPOSES ONLY.  IF CONFIRMATION IS NEEDED FOR ANY PURPOSE, NOTIFY LAB WITHIN 5 DAYS.  LOWEST DETECTABLE LIMITS FOR URINE DRUG SCREEN Drug Class                     Cutoff (ng/mL) Amphetamine and metabolites    1000 Barbiturate and metabolites    200 Benzodiazepine                 200 Tricyclics and metabolites     300 Opiates and metabolites        300 Cocaine and metabolites        300 THC                            50 Performed at [facility], [address] [place], [place] [address]   Magnesium     Status: None   Collection Time: 03/31/22  7:18 PM  Result Value Ref Range   Magnesium 2.3 1.7 - 2.4 mg/dL    Comment: Performed at [facility], [address], [place], [place] [address]  Troponin I (High Sensitivity)     Status: None   Collection Time: 03/31/22  7:18 PM  Result Value Ref Range   Troponin I (High Sensitivity) 5 <18 ng/L    Comment: (NOTE) Elevated high sensitivity troponin I (hsTnI) values and significant  changes across serial measurements may suggest ACS but many other  chronic and acute conditions are known to elevate hsTnI results.  Refer to the "Links" section for chest pain algorithms and additional  guidance. Performed at [facility], [address] [place], [place] [address]     Blood Alcohol level:  Lab Results  Component Value Date  ETH <10 03/29/2022   ETH <10 09/12/2021    Physical Findings:  CIWA:    COWS:     Musculoskeletal: Strength & Muscle Tone: within normal  limits Gait & Station: normal Patient leans: [name]  Psychiatric Specialty Exam:  Presentation  General Appearance: Appropriate for Environment; Casual  Eye Contact:None  Speech:Pressured  Speech Volume:Increased  Handedness:Right   Mood and Affect  Mood:Angry; Irritable; Labile  Affect:Blunt   Thought Process  Thought Processes:Disorganized; Irrevelant  Descriptions of Associations:Loose  Orientation:Partial  Thought Content:Delusions; Illogical; Paranoid Ideation; Rumination  History of Schizophrenia/Schizoaffective disorder:Yes  Duration of Psychotic Symptoms:Greater than six months  Hallucinations:Hallucinations: None  Ideas of Reference:None  Suicidal Thoughts:Suicidal Thoughts: No  Homicidal Thoughts:Homicidal Thoughts: No   Sensorium  Memory:Immediate Fair; Recent Fair; Remote Fair  Judgment:Impaired  Insight:Lacking   Executive Functions  Concentration:Poor  Attention Span:Poor  Recall:Poor  Fund of Knowledge:Poor  Language:Poor   Psychomotor Activity  Psychomotor Activity:Psychomotor Activity: Decreased; Psychomotor Retardation   Assets  Assets:Communication Skills; Financial Resources/Insurance; Housing; Social Support; Physical Health   Sleep  Sleep:Sleep: Good Number of Hours of Sleep: 10    Physical Exam: Physical Exam Vitals and nursing note reviewed.  Constitutional:      Appearance: Normal appearance. She is normal weight.  Skin:    General: Skin is warm and dry.     Capillary Refill: Capillary refill takes less than 2 seconds.  Neurological:     General: No focal deficit present.     Mental Status: She is alert and oriented to person, place, and time. Mental status is at baseline.  Psychiatric:        Mood and Affect: Mood normal.        Behavior: Behavior normal.        Thought Content: Thought content normal.        Judgment: Judgment normal.    Review of Systems  Psychiatric/Behavioral:  Depression:  denies. Hallucinations: denies. Substance abuse: denies. Suicidal ideas: denies.   All other systems reviewed and are negative.  Blood pressure (!) 101/58, pulse (!) 44, temperature 97.8 F (36.6 C), temperature source Axillary, resp. rate 17, SpO2 99 %. There is no height or weight on file to calculate BMI.   Medical Decision Making: She still continues to meet inpatient criteria, she also meets criteria for IVC which we can continue to uphold due to her unwillingness to take her medications.  She has previously refuse food and all oral intake, and an attempt to starve herself.  There is evidence that patient may deteriorate while in the emergency room, and may likely need medical admission if she continues to refuse food, medications, and liquids.   Failure to treat her illness would pose an imminent threat including death to the patient if not treated.     *Patient continues to meet inpatient psychiatric criteria.  In the event patient ultimately requires medical admission due to deterioration, deconditioning, poor oral intake/electrolyte abnormalities will prefer she is admitted to [facility].  Patient remains a viable candidate for ECT.  Problem 1: Paranoid Schizophrenia  *Continue with IVC order and Recruitment consultantsafety sitter. *Will place order for EKG once patient allows. *New orders have been placed for Haldol 5 mg p.o. twice daily or Haldol 5 mg IM twice daily. Order also placed for Ativan 1 mg p.o. twice daily or Ativan 1 mg IM twice daily.   Non-Emergent Forced Med Protocol (03/31/2022, IVC renewed on March 30 2022, has been signed and placed in patient's chart.  Nonemergency  forced med protocol is as followed: - Please offer PO and if pt refuses give IM Haldol and Ativan at same dose. These orders are linked in Epic.              - This should be given even in absence of agitation  - Should be held for sedation   Problem 2: Anorexia: Patient did eat yesterday after being medicated. CNA was  able to assist with feeding. Please note patient is able to feed self, and take care of self. She has become oppositional and refuse oral intake at times.     Maryagnes Amos, FNP 04/01/2022, 10:59 AM

## 2022-04-01 NOTE — ED Notes (Addendum)
Pt sleeping, pt refusing medication at this time when awoken.  Pt currently difficult to arouse with verbal stimuli.  Holding AM meds as discussed w/ T. Starkes-Perry FNP during morning rounds due to pt's current sedation level.

## 2022-04-01 NOTE — Progress Notes (Addendum)
Update:Per Caryn Bee, FNP pt is still refusing to eat, drink, and to take medication. [facility] Transfer has been delayed until pt 's condition improves. CSW will follow up with disposition.     Pt was accepted to [facility] Today 04/01/22 PENDING Negative COVID-19; Bed Assignment 323.   Attending Physician will be Dr. Toni Amend  Report can be called to: [phone]  Pt can arrive after labs results; Charge RN will coordinate   Care Team notified: Dr. Toni Amend, Claudia Desanctis, RN , Daryl Eastern, RN, Elane Fritz, DO, Caleen Essex, RN,  Malva Limes, RN, Leonarda Salon, RN,  Robinette Haines, Counselor, [place], LCAS, Sharolyn Douglas, Demetria Ravenell, RN   [place], LCSWA 04/01/2022 @ 10:45 AM

## 2022-04-01 NOTE — ED Notes (Signed)
Pt refusing breakfast 

## 2022-04-02 DIAGNOSIS — F29 Unspecified psychosis not due to a substance or known physiological condition: Secondary | ICD-10-CM | POA: Diagnosis not present

## 2022-04-02 LAB — COMPREHENSIVE METABOLIC PANEL
ALT: 18 U/L (ref 0–44)
AST: 22 U/L (ref 15–41)
Albumin: 3.9 g/dL (ref 3.5–5.0)
Alkaline Phosphatase: 59 U/L (ref 38–126)
Anion gap: 12 (ref 5–15)
BUN: 13 mg/dL (ref 8–23)
CO2: 22 mmol/L (ref 22–32)
Calcium: 9.1 mg/dL (ref 8.9–10.3)
Chloride: 103 mmol/L (ref 98–111)
Creatinine, Ser: 0.73 mg/dL (ref 0.44–1.00)
GFR, Estimated: >60 mL/min (ref >60)
Glucose, Bld: 76 mg/dL (ref 70–99)
Potassium: 4.3 mmol/L (ref 3.5–5.1)
Sodium: 137 mmol/L (ref 135–145)
Total Bilirubin: 1.2 mg/dL (ref 0.3–1.2)
Total Protein: 7.2 g/dL (ref 6.5–8.1)

## 2022-04-02 NOTE — ED Notes (Signed)
Patient is refusing to get her VSs at this time. Will continue to try to get them at a later time.

## 2022-04-02 NOTE — ED Notes (Signed)
Patient is refusing all meds, but does allow for vital sign check and blood draw. Requesting to "go home" advised that she was IVC and needed to remain for observation. Pacing room at this time.

## 2022-04-02 NOTE — ED Notes (Signed)
Due to patient refusing PO medications. Notified Dr. Donnald Garre. I gave the IM haldol and ativan per nursing order and Dr. Donnald Garre. Will continue to monitor.

## 2022-04-02 NOTE — ED Provider Notes (Signed)
Emergency Medicine Observation Re-evaluation Note  [name] is a 61 y.o. female, seen on rounds today.  Pt initially presented to the ED for complaints of IVC Currently, the patient is sleeping.  Physical Exam  BP (!) 89/63 (BP Location: Right Arm)   Pulse 66   Temp 97.6 F (36.4 C) (Oral)   Resp 16   SpO2 99%  Physical Exam General: Sleeping Cardiac: Well-perfused Lungs: No increased work of breathing Psych: Resting, calm  ED Course / MDM  EKG:EKG Interpretation  Date/Time:  Tuesday March 31 2022 17:03:10 EDT Ventricular Rate:  70 PR Interval:  114 QRS Duration: 77 QT Interval:  566 QTC Calculation: 611 R Axis:   -71 Text Interpretation: Sinus rhythm Borderline short PR interval Left axis deviation Abnormal T, probable ischemia, anterior leads Prolonged QT interval Similar anterior  TW abnormality on past ECGs Confirmed by Alvira Monday ([id number]) on 04/01/2022 2:44:19 PM  I have reviewed the labs performed to date as well as medications administered while in observation.  Recent changes in the last 24 hours include none.  Plan  Current plan is for ongoing management with behavioral health and inpatient placement.    Loetta Rough, MD 04/02/22 [phone]

## 2022-04-02 NOTE — ED Notes (Signed)
Will update pt vitals when she wakes up.

## 2022-04-02 NOTE — Consult Note (Signed)
[facility]  04/02/2022 9:38 PM Sonia Sidesis Lea  MRN:  [id number]   Subjective:   On evaluation today, patient is initially lying in bed.  After introducing myself, the patient began stating that she was being held here against her will.  When asked if she recalls why she was brought to the emergency room, patient becomes angry and states that there is nothing wrong with her, there is no reason for her to be here, and that this provider's questions have nothing to do with why she is here.  When asked about suicidal ideation, patient states "that has nothing to do with why I am here sir."  Patient continued to repeat that statement when asked about AVH homicidal ideations, paranoia.  Patient states that she is not eating here because she does not like the food.  She reports that she eats at home.  States that she likes to eat healthy. Throughout the evaluation she continued to reference discharging and going home, continues to lack insight into her ongoing decompensation of schizophrenia.  Patient does not appear to be responding to internal stimuli during the assessment, however nurse and sitter reports at times she does talk to herself or things that are in the room when no one is there.    Patient remains extremely agitated, has not been compliant with medication.  At this time she remains under a forced medicine order, initiated by her legal guardian in order to receive medications to help with psychiatric stabilization.  She continues to present with delusions, psychosis, paranoia.  Patient will require nonemergency forced medications, in order to attempt to stabilize.      CMP completed today is unremarkable.  Requested that staff record percentage of meals eaten and fluid intake. Order placed.   Principal Problem: Schizophrenia, paranoid (HCC) Diagnosis:  Principal Problem:   Schizophrenia, paranoid [facility](HCC)   ED Assessment Time Calculation: Start Time: 1300 Stop Time: 1315 Total  Time in Minutes (Assessment Completion): 15   Past Psychiatric History: Paranoid schizophrenia.  Noncompliance with medication regimen last inpatient [facility], for similar presentation as noted above with acute decompensation of schizophrenia, refusal to eat.  At which time patient received ECT therapy, did well after her first session  [place] Scale:  Flowsheet Row [facility]) [facility] Ad[facility] (Discharged) [facility] Hosp-Admission (Discharged) from 09/12/2021 in [facility]  C-SSRS RISK CATEGORY No Risk No Risk No Risk       Past Medical History:  Past Medical History:  Diagnosis Date   Anemia 10/02/2021   Non compliance w medication regimen    Schizophrenia [facility](HCC)     Past Surgical History:  Procedure Laterality Date   BIOPSY  09/25/2021   Procedure: BIOPSY;  Surgeon: Rachael FeeJacobs, Daniel P, MD;  [facility];  Service: Gastroenterology;;   ESOPHAGOGASTRODUODENOSCOPY (EGD) WITH PROPOFOL [name] 09/25/2021   Procedure: ESOPHAGOGASTRODUODENOSCOPY (EGD) WITH PROPOFOL;  Surgeon: Rachael FeeJacobs, Daniel P, MD;  Location[facility];  Service: Gastroenterology;  Laterality: [name];  With NGT placement   Family History: History reviewed. No pertinent family history. Family Psychiatric  History: Deniies Social History:  Social History   Substance and Sexual Activity  Alcohol Use Not Currently   Comment: Refuses to disclose how much     Social History   Substance and Sexual Activity  Drug Use No   Comment: Refuses to answer  Social History   Socioeconomic History   Marital status: Single    Spouse name: Not on file   Number of children: Not on file   Years of education: Not on file   Highest education level: Not on file  Occupational History   Not on file  Tobacco Use   Smoking status: Some Days    Packs/day: 0.50     Types: Cigarettes   Smokeless tobacco: Never  Vaping Use   Vaping Use: Every day  Substance and Sexual Activity   Alcohol use: Not Currently    Comment: Refuses to disclose how much   Drug use: No    Comment: Refuses to answer   Sexual activity: Never    Comment: refused to answer  Other Topics Concern   Not on file  Social History Narrative   Not on file   Social Determinants of Health   Financial Resource Strain: Not on file  Food Insecurity: Not on file  Transportation Needs: Not on file  Physical Activity: Not on file  Stress: Not on file  Social Connections: Not on file    Sleep: Good  Appetite:  Fair  Current Medications: Current Facility-Administered Medications  Medication Dose Route Frequency Provider Last Rate Last Admin   haloperidol (HALDOL) tablet 5 mg  5 mg Oral BID Starkes-Perry, Juel Burrow, FNP       Or   haloperidol lactate (HALDOL) injection 5 mg  5 mg Intramuscular BID Maryagnes Amos, FNP   5 mg at 03/31/22 1450   LORazepam (ATIVAN) tablet 1 mg  1 mg Oral BID Maryagnes Amos, FNP       Or   LORazepam (ATIVAN) injection 1 mg  1 mg Intramuscular BID Maryagnes Amos, FNP   1 mg at 03/31/22 1451   midodrine (PROAMATINE) tablet 2.5 mg  2.5 mg Oral TID WC Sherian Maroon A, PA       mirtazapine (REMERON) tablet 7.5 mg  7.5 mg Oral QHS Sherian Maroon A, PA       multivitamin with minerals tablet 1 tablet  1 tablet Oral Daily Sherian Maroon A, PA       nicotine (NICODERM CQ - dosed in mg/24 hr) patch 7 mg  7 mg Transdermal Daily Sherian Maroon A, PA       thiamine (VITAMIN B1) tablet 100 mg  100 mg Oral Daily Sherian Maroon A, PA       Current Outpatient Medications  Medication Sig Dispense Refill   clonazePAM (KLONOPIN) 0.5 MG tablet Take 0.5 tablets (0.25 mg total) by mouth 2 (two) times daily. (Patient not taking: Reported on 03/30/2022) 60 tablet 0   haloperidol (HALDOL) 5 MG tablet Take 1 tablet (5 mg total) by mouth 2 (two) times  daily. (Patient not taking: Reported on 03/30/2022) 60 tablet 3   midodrine (PROAMATINE) 2.5 MG tablet Take 1 tablet (2.5 mg total) by mouth 3 (three) times daily with meals. (Patient not taking: Reported on 03/30/2022)  0   mirtazapine (REMERON) 15 MG tablet Take 0.5 tablets (7.5 mg total) by mouth at bedtime. (Patient not taking: Reported on 03/30/2022) 30 tablet 2   Multiple Vitamin (MULTIVITAMIN WITH MINERALS) TABS tablet Take 1 tablet by mouth daily. (Patient not taking: Reported on 03/30/2022)     OLANZapine (ZYPREXA) 5 MG tablet Take 1 tablet (5 mg total) by mouth at bedtime. (Patient not taking: Reported on 03/30/2022) 30 tablet 2   thiamine 100 MG tablet Take 1 tablet (100 mg total)  by mouth daily. (Patient not taking: Reported on 03/30/2022)      Lab Results:  Results for orders placed or performed during the hospital encounter of 03/29/22 (from the past 48 hour(s))  Resp Panel by RT-PCR (Flu A&B, Covid) Anterior Nasal Swab     Status: None   Collection Time: 04/01/22 10:19 AM   Specimen: Anterior Nasal Swab  Result Value Ref Range   SARS Coronavirus 2 by RT PCR NEGATIVE NEGATIVE    Comment: (NOTE) SARS-CoV-2 target nucleic acids are NOT DETECTED.  The SARS-CoV-2 RNA is generally detectable in upper respiratory specimens during the acute phase of infection. The lowest concentration of SARS-CoV-2 viral copies this assay can detect is 138 copies/mL. A negative result does not preclude SARS-Cov-2 infection and should not be used as the sole basis for treatment or other patient management decisions. A negative result may occur with  improper specimen collection/handling, submission of specimen other than nasopharyngeal swab, presence of viral mutation(s) within the areas targeted by this assay, and inadequate number of viral copies(<138 copies/mL). A negative result must be combined with clinical observations, patient history, and epidemiological information. The expected result is  Negative.  Fact Sheet for Patients:  BloggerCourse.com  Fact Sheet for Healthcare Providers:  SeriousBroker.it  This test is no t yet approved or cleared by the [place] FDA and  has been authorized for detection and/or diagnosis of SARS-CoV-2 by FDA under an Emergency Use Authorization (EUA). This EUA will remain  in effect (meaning this test can be used) for the duration of the COVID-19 declaration under Section 564(b)(1) of the Act, 21 U.S.C.section 360bbb-3(b)(1), unless the authorization is terminated  or revoked sooner.       Influenza A by PCR NEGATIVE NEGATIVE   Influenza B by PCR NEGATIVE NEGATIVE    Comment: (NOTE) The Xpert Xpress SARS-CoV-2/FLU/RSV plus assay is intended as an aid in the diagnosis of influenza from Nasopharyngeal swab specimens and should not be used as a sole basis for treatment. Nasal washings and aspirates are unacceptable for Xpert Xpress SARS-CoV-2/FLU/RSV testing.  Fact Sheet for Patients: BloggerCourse.com  Fact Sheet for Healthcare Providers: SeriousBroker.it  This test is not yet approved or cleared by the [place] FDA and has been authorized for detection and/or diagnosis of SARS-CoV-2 by FDA under an Emergency Use Authorization (EUA). This EUA will remain in effect (meaning this test can be used) for the duration of the COVID-19 declaration under Section 564(b)(1) of the Act, 21 U.S.C. section 360bbb-3(b)(1), unless the authorization is terminated or revoked.  Performed at [facility], [address] [place], [place] [address]   Comprehensive metabolic panel     Status: None   Collection Time: 04/02/22 10:39 AM  Result Value Ref Range   Sodium 137 135 - 145 mmol/L   Potassium 4.3 3.5 - 5.1 mmol/L   Chloride 103 98 - 111 mmol/L   CO2 22 22 - 32 mmol/L   Glucose, Bld 76 70 - 99 mg/dL    Comment:  Glucose reference range applies only to samples taken after fasting for at least 8 hours.   BUN 13 8 - 23 mg/dL   Creatinine, Ser 9.67 0.44 - 1.00 mg/dL   Calcium 9.1 8.9 - 89.3 mg/dL   Total Protein 7.2 6.5 - 8.1 g/dL   Albumin 3.9 3.5 - 5.0 g/dL   AST 22 15 - 41 U/L   ALT 18 0 - 44 U/L   Alkaline Phosphatase 59 38 - 126  U/L   Total Bilirubin 1.2 0.3 - 1.2 mg/dL   GFR, Estimated >42 >70 mL/min    Comment: (NOTE) Calculated using the CKD-EPI Creatinine Equation (2021)    Anion gap 12 5 - 15    Comment: Performed at [facility], [address], [place], [place] [address]    Blood Alcohol level:  Lab Results  Component Value Date   [facility] <10 03/29/2022   ETH <10 09/12/2021    Physical Findings:  CIWA:    COWS:     Musculoskeletal: Strength & Muscle Tone: within normal limits Gait & Station: normal Patient leans: [name]  Psychiatric Specialty Exam:  Presentation  General Appearance: Appropriate for Environment; Casual  Eye Contact:Minimal  Speech:Pressured  Speech Volume:Increased  Handedness:Right   Mood and Affect  Mood:Angry; Irritable; Labile  Affect:Blunt   Thought Process  Thought Processes:Disorganized; Irrevelant  Descriptions of Associations:Loose  Orientation:Partial  Thought Content:Illogical; Paranoid Ideation  History of Schizophrenia/Schizoaffective disorder:Yes  Duration of Psychotic Symptoms:Greater than six months  Hallucinations:Hallucinations: Other (comment) (patient states "that has nothing to do with why I am here sir.")  Ideas of Reference:None  Suicidal Thoughts:Suicidal Thoughts: -- (patient states "that has nothing to do with why I am here sir.")  Homicidal Thoughts:Homicidal Thoughts: -- (patient states "that has nothing to do with why I am here sir.")   Sensorium  Memory:Immediate Fair; Recent Fair; Remote Fair  Judgment:Impaired  Insight:Lacking   Executive Functions   Concentration:Poor  Attention Span:Poor  Recall:Poor  Fund of Knowledge:Poor  Language:Poor   Psychomotor Activity  Psychomotor Activity:Psychomotor Activity: Decreased; Psychomotor Retardation   Assets  Assets:Communication Skills; Financial Resources/Insurance; Housing; Social Support; Physical Health   Sleep  Sleep:Sleep: Good Number of Hours of Sleep: 10    Physical Exam: Physical Exam Vitals and nursing note reviewed.  Constitutional:      Appearance: Normal appearance. She is normal weight.  Skin:    General: Skin is warm and dry.     Capillary Refill: Capillary refill takes less than 2 seconds.  Neurological:     General: No focal deficit present.     Mental Status: She is alert and oriented to person, place, and time. Mental status is at baseline.  Psychiatric:        Mood and Affect: Mood normal.        Behavior: Behavior normal.        Thought Content: Thought content normal.        Judgment: Judgment normal.    Review of Systems  Psychiatric/Behavioral:  Depression: denies. Hallucinations: denies. Substance abuse: denies. Suicidal ideas: denies.   All other systems reviewed and are negative.  Blood pressure (!) 103/59, pulse 68, temperature (!) 97.4 F (36.3 C), temperature source Oral, resp. rate 16, SpO2 99 %. There is no height or weight on file to calculate BMI.   Medical Decision Making: She still continues to meet inpatient criteria, she also meets criteria for IVC which we can continue to uphold due to her unwillingness to take her medications.  She has previously refuse food and all oral intake, and an attempt to starve herself.  There is evidence that patient may deteriorate while in the emergency room, and may likely need medical admission if she continues to refuse food, medications, and liquids.   Failure to treat her illness would pose an imminent threat including death to the patient if not treated.     *Patient continues to meet  inpatient psychiatric criteria.  In the event  patient ultimately requires medical admission due to deterioration, deconditioning, poor oral intake/electrolyte abnormalities will prefer she is admitted to [facility].  Patient remains a viable candidate for ECT.  Problem 1: Paranoid Schizophrenia  *Continue with IVC order and Recruitment consultant. Continue Haldol 5 mg p.o. twice daily or Haldol 5 mg IM twice daily. Continue  Ativan 1 mg p.o. twice daily or Ativan 1 mg IM twice daily.   Non-Emergent Forced Med Protocol (03/31/2022, IVC renewed on March 30 2022, has been signed and placed in patient's chart.  Nonemergency forced med protocol is as followed: - Please offer PO and if pt refuses give IM Haldol and Ativan at same dose. These orders are linked in Epic.              - This should be given even in absence of agitation  - Should be held for sedation   Problem 2: Anorexia:  Please note patient is able to feed self, and take care of self. She has become oppositional and refuse oral intake at times.     Jackelyn Poling, NP 04/02/2022, 9:38 PM

## 2022-04-03 ENCOUNTER — Other Ambulatory Visit: Payer: Self-pay

## 2022-04-03 DIAGNOSIS — F29 Unspecified psychosis not due to a substance or known physiological condition: Secondary | ICD-10-CM | POA: Diagnosis not present

## 2022-04-03 NOTE — Progress Notes (Signed)
April Alexander, RN, Patient Transfer Coordinator, called from [place], [place] [facility] to provide information potentially needed before the pt discharges.   The pt does not have a PCP.  She is assigned Social Workers. The main phone number is: [phone].  Adaku Okibedi-Mott (Wed-Sat) : Ext: [phone] Stephanie Dearmin (Wed-Sat) : Ext: [phone] Sandi Mealy (Sun-Wed) : [phone] Olena Mater (Sun-Wed) : Ext: [phone]  Prior to the pt being discharged, she will need to be scheduled a Mental Health follow up appointment with:   Dr. Floy Sabina: [phone], Ext: [phone]

## 2022-04-03 NOTE — Consult Note (Signed)
[facility] Psych ED Progress Note  04/03/2022 9:18 PM Nuha Degner  MRN:  [id number]   Subjective:   On evaluation today, patient is initially lying in bed.  Upon introducing myself, the patient immediately begins yelling at me.  Patient states that the questions have nothing to do with why she is here.  When asked about auditory and visual hallucinations, patient states no then makes several nonsensical statements.   Patient does not appear to be responding to internal stimuli during the assessment, however nurse and sitter reports at times she does talk to herself or things that are in the room when no one is there.  Patient encouraged to eat meals and drink fluids.  Patient states that she is not planning to eat or drink anything.  Patient states that she is not going to take medications.  Patient did receive Haldol 5 mg IM last night and this morning.  Throughout the evaluation she continued to reference discharging and going home, she continues to lack insight into her ongoing decompensation of schizophrenia.    Patient remains extremely agitated, has not been compliant with medication.  At this time she remains under a forced medicine order, initiated by her legal guardian in order to receive medications to help with psychiatric stabilization.  She continues to present with delusions, psychosis, paranoia.  Patient will require nonemergency forced medications, in order to attempt to stabilize.     CMP completed on 04/02/2022 was unremarkable.  Recommend monitoring for electrolyte changes and dehydration.  Patient may need medical admission if she continues to refuse to eat and drink.  Requested that staff record percentage of meals eaten and fluid intake.  Order to monitoring food and fluid intake was placed on 04/02/2022.  Principal Problem: Schizophrenia, paranoid (HCC) Diagnosis:  Principal Problem:   Schizophrenia, paranoid [facility])   ED Assessment Time Calculation: Start Time: 1300 Stop Time:  1315 Total Time in Minutes (Assessment Completion): 15   Past Psychiatric History: Paranoid schizophrenia.  Noncompliance with medication regimen last inpatient admission to  regional geropsych unit March 2023, for similar presentation as noted above with acute decompensation of schizophrenia, refusal to eat.  At which time patient received ECT therapy, did well after her first session  [place] Scale:  Flowsheet Row Admission (Discharged) from 10/17/2021 in [facility] BEHAVIORAL MEDICINE Admission (Discharged) from 10/02/2021 in [facility] REGIONAL CARDIAC MED PCU ED to Hosp-Admission (Discharged) from 09/12/2021 in [facility] [facility] EAST ONCOLOGY  C-SSRS RISK CATEGORY No Risk No Risk No Risk       Past Medical History:  Past Medical History:  Diagnosis Date   Anemia 10/02/2021   Non compliance w medication regimen    Schizophrenia [facility])     Past Surgical History:  Procedure Laterality Date   BIOPSY  09/25/2021   Procedure: BIOPSY;  Surgeon: Rachael Fee, MD;  [facility];  Service: Gastroenterology;;   ESOPHAGOGASTRODUODENOSCOPY (EGD) WITH PROPOFOL [name] 09/25/2021   Procedure: ESOPHAGOGASTRODUODENOSCOPY (EGD) WITH PROPOFOL;  Surgeon: Rachael Fee, MD;  Location: [facility];  Service: Gastroenterology;  Laterality: [name];  With NGT placement   Family History: History reviewed. No pertinent family history. Family Psychiatric  History: Deniies Social History:  Social History   Substance and Sexual Activity  Alcohol Use Not Currently   Comment: Refuses to disclose how much     Social History   Substance and Sexual Activity  Drug Use No   Comment: Refuses to answer    Social History   Socioeconomic History  Marital status: Single    Spouse name: Not on file   Number of children: Not on file   Years of education: Not on file   Highest education level: Not on file  Occupational History   Not on file  Tobacco Use   Smoking status: Some Days     Packs/day: 0.50    Types: Cigarettes   Smokeless tobacco: Never  Vaping Use   Vaping Use: Every day  Substance and Sexual Activity   Alcohol use: Not Currently    Comment: Refuses to disclose how much   Drug use: No    Comment: Refuses to answer   Sexual activity: Never    Comment: refused to answer  Other Topics Concern   Not on file  Social History Narrative   Not on file   Social Determinants of Health   Financial Resource Strain: Not on file  Food Insecurity: Not on file  Transportation Needs: Not on file  Physical Activity: Not on file  Stress: Not on file  Social Connections: Not on file    Sleep: Good  Appetite:  Fair  Current Medications: Current Facility-Administered Medications  Medication Dose Route Frequency Provider Last Rate Last Admin   haloperidol (HALDOL) tablet 5 mg  5 mg Oral BID Starkes-Perry, Juel Burrow, FNP       Or   haloperidol lactate (HALDOL) injection 5 mg  5 mg Intramuscular BID Maryagnes Amos, FNP   5 mg at 04/03/22 1058   LORazepam (ATIVAN) tablet 1 mg  1 mg Oral BID Maryagnes Amos, FNP       Or   LORazepam (ATIVAN) injection 1 mg  1 mg Intramuscular BID Maryagnes Amos, FNP   1 mg at 04/03/22 1058   midodrine (PROAMATINE) tablet 2.5 mg  2.5 mg Oral TID WC Sherian Maroon A, PA       mirtazapine (REMERON) tablet 7.5 mg  7.5 mg Oral QHS Sherian Maroon A, PA       multivitamin with minerals tablet 1 tablet  1 tablet Oral Daily Sherian Maroon A, PA       nicotine (NICODERM CQ - dosed in mg/24 hr) patch 7 mg  7 mg Transdermal Daily Sherian Maroon A, PA       thiamine (VITAMIN B1) tablet 100 mg  100 mg Oral Daily Sherian Maroon A, PA       Current Outpatient Medications  Medication Sig Dispense Refill   clonazePAM (KLONOPIN) 0.5 MG tablet Take 0.5 tablets (0.25 mg total) by mouth 2 (two) times daily. (Patient not taking: Reported on 03/30/2022) 60 tablet 0   haloperidol (HALDOL) 5 MG tablet Take 1 tablet (5 mg total) by  mouth 2 (two) times daily. (Patient not taking: Reported on 03/30/2022) 60 tablet 3   midodrine (PROAMATINE) 2.5 MG tablet Take 1 tablet (2.5 mg total) by mouth 3 (three) times daily with meals. (Patient not taking: Reported on 03/30/2022)  0   mirtazapine (REMERON) 15 MG tablet Take 0.5 tablets (7.5 mg total) by mouth at bedtime. (Patient not taking: Reported on 03/30/2022) 30 tablet 2   Multiple Vitamin (MULTIVITAMIN WITH MINERALS) TABS tablet Take 1 tablet by mouth daily. (Patient not taking: Reported on 03/30/2022)     OLANZapine (ZYPREXA) 5 MG tablet Take 1 tablet (5 mg total) by mouth at bedtime. (Patient not taking: Reported on 03/30/2022) 30 tablet 2   thiamine 100 MG tablet Take 1 tablet (100 mg total) by mouth daily. (Patient not taking: Reported on  03/30/2022)      Lab Results:  Results for orders placed or performed during the hospital encounter of 03/29/22 (from the past 48 hour(s))  Comprehensive metabolic panel     Status: None   Collection Time: 04/02/22 10:39 AM  Result Value Ref Range   Sodium 137 135 - 145 mmol/L   Potassium 4.3 3.5 - 5.1 mmol/L   Chloride 103 98 - 111 mmol/L   CO2 22 22 - 32 mmol/L   Glucose, Bld 76 70 - 99 mg/dL    Comment: Glucose reference range applies only to samples taken after fasting for at least 8 hours.   BUN 13 8 - 23 mg/dL   Creatinine, Ser 0.73 0.44 - 1.00 mg/dL   Calcium 9.1 8.9 - 10.3 mg/dL   Total Protein 7.2 6.5 - 8.1 g/dL   Albumin 3.9 3.5 - 5.0 g/dL   AST 22 15 - 41 U/L   ALT 18 0 - 44 U/L   Alkaline Phosphatase 59 38 - 126 U/L   Total Bilirubin 1.2 0.3 - 1.2 mg/dL   GFR, Estimated >60 >60 mL/min    Comment: (NOTE) Calculated using the CKD-EPI Creatinine Equation (2021)    Anion gap 12 5 - 15    Comment: Performed at [facility], [place] [address] [place], [place] [address]    Blood Alcohol level:  Lab Results  Component Value Date   [facility] <10 03/29/2022   ETH <10 09/12/2021    Physical Findings:  CIWA:     COWS:     Musculoskeletal: Strength & Muscle Tone: within normal limits Gait & Station: normal Patient leans: [name]  Psychiatric Specialty Exam:  Presentation  General Appearance: Appropriate for Environment; Casual  Eye Contact:Minimal  Speech:Pressured  Speech Volume:Increased  Handedness:Right   Mood and Affect  Mood:Angry; Irritable; Labile  Affect:Blunt   Thought Process  Thought Processes:Disorganized; Irrevelant  Descriptions of Associations:Loose  Orientation:Partial  Thought Content:Illogical; Paranoid Ideation  History of Schizophrenia/Schizoaffective disorder:Yes  Duration of Psychotic Symptoms:Greater than six months  Hallucinations:Hallucinations: Other (comment) (patient states "that has nothing to do with why I am here sir.")  Ideas of Reference:None  Suicidal Thoughts:Suicidal Thoughts: -- (patient states "that has nothing to do with why I am here sir.")  Homicidal Thoughts:Homicidal Thoughts: -- (patient states "that has nothing to do with why I am here sir.")   White; Recent Fair; Remote [place]   Executive Functions  Concentration:Poor  Attention Span:Poor  Recall:Poor  [employer] of Knowledge:Poor  Language:Poor   Psychomotor Activity  Psychomotor Activity:No data recorded   Assets  Assets:Communication Skills; Financial Resources/Insurance; Housing; Social Support; Physical Health   Sleep  Sleep:Sleep: Good    Physical Exam: Physical Exam Vitals and nursing note reviewed.  Constitutional:      Appearance: Normal appearance. She is normal weight.  Skin:    General: Skin is warm and dry.     Capillary Refill: Capillary refill takes less than 2 seconds.  Neurological:     General: No focal deficit present.     Mental Status: She is alert and oriented to person, place, and time. Mental status is at baseline.  Psychiatric:        Mood and Affect: Mood  normal.        Behavior: Behavior normal.        Thought Content: Thought content normal.        Judgment: Judgment normal.    Review of Systems  Psychiatric/Behavioral:  Depression: denies. Hallucinations: denies. Substance abuse: denies. Suicidal ideas: denies.   All other systems reviewed and are negative.  Blood pressure 95/69, pulse 91, temperature 98 F (36.7 C), temperature source Oral, resp. rate 18, SpO2 100 %. There is no height or weight on file to calculate BMI.   Medical Decision Making: She still continues to meet inpatient criteria, she also meets criteria for IVC which we can continue to uphold due to her unwillingness to take her medications.  She has previously refuse food and all oral intake, and an attempt to starve herself.  There is evidence that patient may deteriorate while in the emergency room, and may likely need medical admission if she continues to refuse food, medications, and liquids.   Failure to treat her illness would pose an imminent threat including death to the patient if not treated.     *Patient continues to meet inpatient psychiatric criteria.  In the event patient ultimately requires medical admission due to deterioration, deconditioning, poor oral intake/electrolyte abnormalities will prefer she is admitted to [facility].  Patient remains a viable candidate for ECT.  Problem 1: Paranoid Schizophrenia  *Continue with IVC order and Air cabin crew. Continue Haldol 5 mg p.o. twice daily or Haldol 5 mg IM twice daily. Continue  Ativan 1 mg p.o. twice daily or Ativan 1 mg IM twice daily.   Non-Emergent Forced Med Protocol (03/31/2022, IVC renewed on March 30 2022, has been signed and placed in patient's chart.  Nonemergency forced med protocol is as followed: - Please offer PO and if pt refuses give IM Haldol and Ativan at same dose. These orders are linked in Epic.              - This should be given even in absence of agitation  - Should be held for  sedation   Problem 2: Anorexia:  Please note patient is able to feed self, and take care of self. She has become oppositional and refuse oral intake at times.     Rozetta Nunnery, NP 04/03/2022, 9:18 PM

## 2022-04-03 NOTE — ED Provider Notes (Signed)
Emergency Medicine Observation Re-evaluation Note  [name] is a 61 y.o. female, seen on rounds today.  Pt initially presented to the ED for complaints of IVC Currently, the patient is still quietly.  Physical Exam  BP (!) 88/64 (BP Location: Left Arm)   Pulse 88   Temp (!) 97.4 F (36.3 C) (Oral)   Resp 18   SpO2 99%  Physical Exam General: No acute distress Cardiac: Well-perfused Lungs: Nonlabored Psych: Unable to assess  ED Course / MDM  EKG:EKG Interpretation  Date/Time:  Tuesday March 31 2022 17:03:10 EDT Ventricular Rate:  70 PR Interval:  114 QRS Duration: 77 QT Interval:  566 QTC Calculation: 611 R Axis:   -71 Text Interpretation: Sinus rhythm Borderline short PR interval Left axis deviation Abnormal T, probable ischemia, anterior leads Prolonged QT interval Similar anterior  TW abnormality on past ECGs Confirmed by Alvira Monday ([id number]) on 04/01/2022 2:44:19 PM  I have reviewed the labs performed to date as well as medications administered while in observation.  Recent changes in the last 24 hours include patient has not been eating and drinking.  Has been receiving psychiatric medications..  Plan  Current plan is for inpatient psychiatric care.    Terrilee Files, MD 04/03/22 1850

## 2022-04-03 NOTE — ED Notes (Signed)
Patient resting comfortably

## 2022-04-03 NOTE — Progress Notes (Signed)
Inpatient Behavioral Health Placement  Pt meets inpatient criteria per Celesta Gentile.  Referral was sent to the following facilities;   Destination Service Provider Address Phone Fax  [facility]  [address] [place] [address] [phone]  [facility]  [address] [place] [address] [phone]  [facility] [address]  [address], [place] [address] [phone]  [facility] [address] [place]  [address], [place] [address] [phone]  [facility]  [address] [place] [address] [phone]  [facility]  [address] [place], [place] [address] [phone]  [facility]  [address] [place] [address] [phone]  [facility]  [address] [place] [address] [phone]  [facility]  [address] [place] [address] [phone]  [facility]  [address] [place] [address] [phone]  [facility]  [address], [place] [address] [phone]  [facility]  [address] [place] [address] [phone]  [facility]  [address], [place] [address] [phone]  [facility]  [address] [place] [address] [phone]  [facility]  [address], [place] [address] [phone]  [facility]  [address] [place] [address] [phone]  [facility]  [address], [place] [address] [phone]  [facility]  [address],  [place] [address] [phone]  [facility]  [address], [place] [address] [phone]  [facility]  [address] [place] [address] [phone]  [facility]  [address] [place] [address] [phone]  [facility]  [address] [place] [address] [phone]  [facility]  [address], [place] [address] [phone]  [facility]  [address], [place] [address] [phone]  [facility]  [address] [place], [place] [address] [phone]  [facility] [address] [place] [address] [phone]  [facility]  [address] [place], [place] [address] [phone]  [facility]  [address] [place] [address] [phone]  [facility]  [address] [place] [address] [phone]  [facility]  [address] [place] [address] [phone]  [facility]  [address], [place] [address] [phone]  [facility] Perinatal and Eating Disorders  [address] [place] [address] [phone]    Situation ongoing,  CSW will follow up.   [name], MSW, LCSWA 04/03/2022  @ 3:07 AM

## 2022-04-03 NOTE — ED Notes (Signed)
Patient has been alert this shift. Patient not eating or drinking at this time.  Patient has been offer food and drink drinking this shift. Patient refusing care.

## 2022-04-04 DIAGNOSIS — F29 Unspecified psychosis not due to a substance or known physiological condition: Secondary | ICD-10-CM | POA: Diagnosis not present

## 2022-04-04 LAB — BASIC METABOLIC PANEL
Anion gap: 16 — ABNORMAL HIGH (ref 5–15)
BUN: 23 mg/dL (ref 8–23)
CO2: 20 mmol/L — ABNORMAL LOW (ref 22–32)
Calcium: 9.5 mg/dL (ref 8.9–10.3)
Chloride: 105 mmol/L (ref 98–111)
Creatinine, Ser: 0.89 mg/dL (ref 0.44–1.00)
GFR, Estimated: >60 mL/min (ref >60)
Glucose, Bld: 91 mg/dL (ref 70–99)
Potassium: 4.4 mmol/L (ref 3.5–5.1)
Sodium: 141 mmol/L (ref 135–145)

## 2022-04-04 MED ORDER — SODIUM CHLORIDE 0.9 % IV BOLUS
1000.0000 mL | Freq: Once | INTRAVENOUS | Status: AC
  Administered 2022-04-04: 1000 mL via INTRAVENOUS

## 2022-04-04 NOTE — ED Notes (Signed)
IVC expires 9/17 

## 2022-04-04 NOTE — Progress Notes (Signed)
Per Lindon Romp, NP, patient meets criteria for inpatient treatment. There are no available  beds at [facility] today. CSW re-faxed referrals to the following facilities for review:  [place] [address] [place] [address] [address] [place] [address] [phone] --  [place], [place] [address] [phone] --  [place] [name] [address], [place] [address] [phone] --  [facility]  Pending - Request Sent [name] [address] [place] [address] [phone] --  [place], [place] [address] [phone] --  [facility]  Pending - Request Sent [name] [place], [place] [address] [phone] --  [facility]  Pending - Request Sent [name] [place], [place] [address] [phone] --  [place] [phone] [address] [place] [address] [phone] --  [facility]  Pending - Request Sent [name] [address], [place] [address] [phone] --  [facility]  Pending - Request Sent [name] [address] [place] [address] [phone] --  [facility]  Pending - Request Sent [name] [address] [place] [address] [phone] --  [name]  Pending - Request Sent [name] [address] [place] [address] [phone] --  [place] [name] [address] [place] [address]  [phone] --  [facility]  Pending - Request Sent [name] [address] [place] [address] [phone] --  [facility]  Pending - Request Sent [name] [address] [place] [address] [phone] --  [facility]  Pending - Request Sent [name] [place]., [place] [address] [phone] --  [facility]  Pending - Request Sent [name] [address] [place] [address] [phone] --  [place] [name] [address] [place] [address] [phone] --  [facility]  Pending - Request Sent [name] [address] [place] [address] [phone] --  [facility]  Pending - Request Sent [name] [place], [place] [address] [phone] --  [facility]  Pending - Request Sent [name] [address] [place] [address] [phone] --  [place] [name] [place], [place] [address] [phone] --  [facility]  Pending - Request Sent [name] [address], [place] [address] [phone] --  [facility]  Pending - Request Sent [name] [address] [name], [place] [address] [phone] --  [facility] [address] [place] [address] [phone] --  [place] [name] [address] [place], [place] [address] [phone] --  [facility]  Pending - Request Sent [name] [address] [place] [address] [phone] --  [facility]  Pending -  Request Sent [name] [address], [place] [address] [phone] --  [place] [name] [address] [place] [address] [phone] --  [place] [name] [address], [place] [address] [phone] --  [place] [facility]  Pending - Request Sent [name] [address], [place] [address] [phone] --   TTS will continue to seek bed placement.  [place]ie [place], MSW, Laurence [place] Phone: [phone] Disposition/TOC

## 2022-04-04 NOTE — Progress Notes (Signed)
Inpatient Behavioral Health Placement   Pt meets inpatient criteria per Emerson Monte.  There are no available beds at [facility] per [facility] or [place]. CSW to follow back up with [facility] on Monday. Referral was sent to the following facilities;    Destination Service Provider Address Phone Fax  [place] [place] [address] [phone]  [facility]  [address] [place] [address] [phone] [place], [place] [address] [place]  [place]., [place] [address] [phone]  [facility]  [address] [place] [address] [phone] [facility]  [place], [place] [address] [phone] [facility]  [place], [place] [address] [phone]  [facility]  [place], [place] [address] [phone] [facility]  [address] [place] [address] [place]  [name]  [address] [place] [address] [phone]  [facility]  [address] [place] [address] [phone]  [facility]  [place] [address], [place] [address] [phone]  [facility]  [address], [place] [address] [phone] [place]  [address] [place] [address] [phone] [facility]  [address], [place] [address] [phone] [facility]  [address] [place] [address] [place]  [address] [place] [address] [phone] [facility]  [address] [place] [address] [phone] [facility]  [address], [place] [address] [phone] [facility]  [address] [place] [address] [phone] [facility]  [place], [place] [address] [phone]  [facility]  [address] [place] [address] [place]  [place]  [address], [place] [address] [phone]  [facility]  [address], [place] [address] [phone] [facility]  [address] [place], [place] [address] [phone]  [facility] [place] [address] [phone] [facility] [place]  [place], [place] [address] [phone] [facility]  [place] [place] [address] [place]  [facility]  [address] [place] [address] [phone]  [facility]  [address], [place] [address] [phone] [place]  [address] [place] [address] [phone] [place] [facility]  [address] [place] [address] [phone]      Situation ongoing,  CSW will follow up.

## 2022-04-04 NOTE — ED Provider Notes (Addendum)
Emergency Medicine Observation Re-evaluation Note  [name] is a 61 y.o. female, seen on rounds today.  Pt initially presented to the ED for complaints of IVC Currently, the patient is awake.  Pt's nurses report that she has still continued to refuse to eat/drink.  The overnight nurse said she had no urine output all night.  Pt has refused oral meds, but is getting IM meds.  Physical Exam  BP 95/63 (BP Location: Left Arm)   Pulse 60   Temp (!) 97.5 F (36.4 C) (Oral)   Resp 16   SpO2 98%  Physical Exam General: awake and alert Cardiac: rr Lungs: clear Psych: agitated  ED Course / MDM  EKG:EKG Interpretation  Date/Time:  Tuesday March 31 2022 17:03:10 EDT Ventricular Rate:  70 PR Interval:  114 QRS Duration: 77 QT Interval:  566 QTC Calculation: 611 R Axis:   -71 Text Interpretation: Sinus rhythm Borderline short PR interval Left axis deviation Abnormal T, probable ischemia, anterior leads Prolonged QT interval Similar anterior  TW abnormality on past ECGs Confirmed by Gareth Morgan [phone]) on 04/01/2022 2:44:19 PM  I have reviewed the labs performed to date as well as medications administered while in observation.  Recent changes in the last 24 hours include pt continues to not eat/drink.  I will order IVFs and a repeat bmp today.  Repeat BMP nl with no signs of AKI.  Plan  Current plan is for awaiting inpatient psych placement.    Isla Pence, MD 04/04/22 [phone]    Isla Pence, MD 04/04/22 [phone]

## 2022-04-05 DIAGNOSIS — F200 Paranoid schizophrenia: Secondary | ICD-10-CM

## 2022-04-05 DIAGNOSIS — F29 Unspecified psychosis not due to a substance or known physiological condition: Secondary | ICD-10-CM | POA: Diagnosis not present

## 2022-04-05 NOTE — Consult Note (Signed)
Telepsych Consultation   Reason for Consult:  [facility] Referring Physician:  Peter Garter, PA Location of Patient: [facility] Location of Provider: Behavioral Health TTS Department  Patient Identification: [name] MRN:  [id number] Principal Diagnosis: Schizophrenia, paranoid (HCC) Diagnosis:  Principal Problem:   Schizophrenia, paranoid (HCC)   Total Time spent with patient: 20 minutes  Subjective:   [name] is a 61 y.o. female patient admitted with psychosis. She presents alert, paranoid and delusional. Concrete, tangential thought processes. Perseverative on going on and not eating food in hospital.  "I'm not going home I was only supposed to be here 10 days no more than 20 days". She reports that "Eggrolls are forcing the hospitalization. I'm not one of them. I am life form. I am not eating. It is not healthy for my body and yall can't make me. That is not my food". Pressured, incoherent speech with some word salad present at times. During assessment patient became agitated and paced room speaking on random topics surrounding different people who want her in hospital and asking provider to call state trooper to transport her home.    HPI:  [name] is a 61 year old female patient with past psychiatric history of psychosis, schizophrenia, noncompliance who presented to [facility]29[facility]22 via sheriff via IVC for hallucinating and combative with family. Since admission to unit patient continues to refuse any food/drink or medications. UDS-, BAL<10. PDMP reviewed, no active prescriptions in past 60 days.   Past Psychiatric History:psychosis, schizophrenia, noncompliance   Risk to Self:   Risk to Others:   Prior Inpatient Therapy:   Prior Outpatient Therapy:    Past Medical History:  Past Medical History:  Diagnosis Date   Anemia 10/02/2021   Non compliance w medication regimen    Schizophrenia [facility])     Past Surgical History:  Procedure Laterality Date   BIOPSY  09/25/2021    Procedure: BIOPSY;  Surgeon: Rachael Fee, MD;  [facility];  Service: Gastroenterology;;   ESOPHAGOGASTRODUODENOSCOPY (EGD) WITH PROPOFOL [name] 09/25/2021   Procedure: ESOPHAGOGASTRODUODENOSCOPY (EGD) WITH PROPOFOL;  Surgeon: Rachael Fee, MD;  Location: [facility];  Service: Gastroenterology;  Laterality: [name];  With NGT placement   Family History: History reviewed. No pertinent family history. Family Psychiatric  History: not noted Social History:  Social History   Substance and Sexual Activity  Alcohol Use Not Currently   Comment: Refuses to disclose how much     Social History   Substance and Sexual Activity  Drug Use No   Comment: Refuses to answer    Social History   Socioeconomic History   Marital status: Single    Spouse name: Not on file   Number of children: Not on file   Years of education: Not on file   Highest education level: Not on file  Occupational History   Not on file  Tobacco Use   Smoking status: Some Days    Packs/day: 0.50    Types: Cigarettes   Smokeless tobacco: Never  Vaping Use   Vaping Use: Every day  Substance and Sexual Activity   Alcohol use: Not Currently    Comment: Refuses to disclose how much   Drug use: No    Comment: Refuses to answer   Sexual activity: Never    Comment: refused to answer  Other Topics Concern   Not on file  Social History Narrative   Not on file   Social Determinants of Health   Financial Resource Strain: Not  on file  Food Insecurity: Not on file  Transportation Needs: Not on file  Physical Activity: Not on file  Stress: Not on file  Social Connections: Not on file   Additional Social History:   Allergies:   Allergies  Allergen Reactions   Aspirin Hives and Other (See Comments)    Possibly caused hives, per sister   Ziprasidone Hcl Rash and Other (See Comments)    Caused twitching. Redness of eyes.     Labs:  Results for orders placed or performed during the hospital encounter  of 03/29/22 (from the past 48 hour(s))  Basic metabolic panel     Status: Abnormal   Collection Time: 04/04/22  8:50 AM  Result Value Ref Range   Sodium 141 135 - 145 mmol/L   Potassium 4.4 3.5 - 5.1 mmol/L   Chloride 105 98 - 111 mmol/L   CO2 20 (L) 22 - 32 mmol/L   Glucose, Bld 91 70 - 99 mg/dL    Comment: Glucose reference range applies only to samples taken after fasting for at least 8 hours.   BUN 23 8 - 23 mg/dL   Creatinine, Ser 1.610.89 0.44 - 1.00 mg/dL   Calcium 9.5 8.9 - 09.610.3 mg/dL   GFR, Estimated >04>60 >54>60 mL/min    Comment: (NOTE) Calculated using the CKD-EPI Creatinine Equation (2021)    Anion gap 16 (H) 5 - 15    Comment: Performed at [facility], [address] [place], [place] [id number]    Medications:  Current Facility-Administered Medications  Medication Dose Route Frequency Provider Last Rate Last Admin   haloperidol (HALDOL) tablet 5 mg  5 mg Oral BID Maryagnes AmosStarkes-Perry, Takia S, FNP       Or   haloperidol lactate (HALDOL) injection 5 mg  5 mg Intramuscular BID Maryagnes AmosStarkes-Perry, Takia S, FNP   5 mg at 04/05/22 1025   LORazepam (ATIVAN) tablet 1 mg  1 mg Oral BID Maryagnes AmosStarkes-Perry, Takia S, FNP       Or   LORazepam (ATIVAN) injection 1 mg  1 mg Intramuscular BID Maryagnes AmosStarkes-Perry, Takia S, FNP   1 mg at 04/05/22 1024   midodrine (PROAMATINE) tablet 2.5 mg  2.5 mg Oral TID WC Sherian Maroonobbins, Cooper A, PA       mirtazapine (REMERON) tablet 7.5 mg  7.5 mg Oral QHS Sherian Maroonobbins, Cooper A, PA       multivitamin with minerals tablet 1 tablet  1 tablet Oral Daily Sherian MaroonRobbins, Cooper A, PA       nicotine (NICODERM CQ - dosed in mg/24 hr) patch 7 mg  7 mg Transdermal Daily Sherian Maroonobbins, Cooper A, PA       thiamine (VITAMIN B1) tablet 100 mg  100 mg Oral Daily Peter Garterobbins, Cooper A, [place]       Current Outpatient Medications  Medication Sig Dispense Refill   clonazePAM (KLONOPIN) 0.5 MG tablet Take 0.5 tablets (0.25 mg total) by mouth 2 (two) times daily. (Patient not taking: Reported on  03/30/2022) 60 tablet 0   haloperidol (HALDOL) 5 MG tablet Take 1 tablet (5 mg total) by mouth 2 (two) times daily. (Patient not taking: Reported on 03/30/2022) 60 tablet 3   midodrine (PROAMATINE) 2.5 MG tablet Take 1 tablet (2.5 mg total) by mouth 3 (three) times daily with meals. (Patient not taking: Reported on 03/30/2022)  0   mirtazapine (REMERON) 15 MG tablet Take 0.5 tablets (7.5 mg total) by mouth at bedtime. (Patient not taking: Reported on 03/30/2022) 30 tablet 2  Multiple Vitamin (MULTIVITAMIN WITH MINERALS) TABS tablet Take 1 tablet by mouth daily. (Patient not taking: Reported on 03/30/2022)     OLANZapine (ZYPREXA) 5 MG tablet Take 1 tablet (5 mg total) by mouth at bedtime. (Patient not taking: Reported on 03/30/2022) 30 tablet 2   thiamine 100 MG tablet Take 1 tablet (100 mg total) by mouth daily. (Patient not taking: Reported on 03/30/2022)      Musculoskeletal: Strength & Muscle Tone: within normal limits Gait & Station: normal Patient leans: [name]  Psychiatric Specialty Exam:  Presentation  General Appearance: Appropriate for Environment; Casual  Eye Contact:Minimal  Speech:Pressured  Speech Volume:Increased  Handedness:Right  Mood and Affect  Mood:Angry; Irritable; Labile  Affect:Blunt  Thought Process  Thought Processes:Disorganized; Irrevelant  Descriptions of Associations:Loose  Orientation:Partial  Thought Content:Illogical; Paranoid Ideation  History of Schizophrenia/Schizoaffective disorder:Yes  Duration of Psychotic Symptoms:Greater than six months  Hallucinations:No data recorded Ideas of Reference:None  Suicidal Thoughts:No data recorded Homicidal Thoughts:No data recorded  Sensorium  Memory:Immediate Fair; Recent Fair; Remote Fair  Judgment:Impaired  Insight:Lacking  Executive Functions  Concentration:Poor  Attention Span:Poor  Recall:Poor  Fund of Knowledge:Poor  Language:Poor  Psychomotor Activity  Psychomotor Activity:No  data recorded  Assets  Assets:Communication Skills; Financial Resources/Insurance; Housing; Social Support; Physical Health  Sleep  Sleep:No data recorded  Physical Exam: Physical Exam Vitals and nursing note reviewed.  HENT:     Head: Normocephalic.     Nose: Nose normal.     Mouth/Throat:     Mouth: Mucous membranes are dry.     Pharynx: Oropharynx is clear.  Eyes:     Pupils: Pupils are equal, round, and reactive to light.  Cardiovascular:     Rate and Rhythm: Normal rate.     Pulses: Normal pulses.  Pulmonary:     Effort: Pulmonary effort is normal.  Abdominal:     General: Abdomen is flat.  Musculoskeletal:        General: Normal range of motion.     Cervical back: Normal range of motion.  Skin:    General: Skin is dry.  Neurological:     Mental Status: She is alert. Mental status is at baseline.  Psychiatric:        Attention and Perception: She is inattentive.        Mood and Affect: Affect is labile, angry and inappropriate.        Speech: Speech is rapid and pressured and tangential.        Behavior: Behavior is uncooperative and agitated.        Thought Content: Thought content is paranoid and delusional. Thought content does not include homicidal or suicidal ideation. Thought content does not include homicidal or suicidal plan.        Cognition and Memory: Cognition is impaired. Memory is impaired.        Judgment: Judgment is impulsive and inappropriate.    Review of Systems  Constitutional:  Positive for weight loss.  Psychiatric/Behavioral:  Positive for hallucinations.   All other systems reviewed and are negative.  Blood pressure (!) 92/50, pulse 64, temperature 97.6 F (36.4 C), temperature source Oral, resp. rate 13, SpO2 94 %. There is no height or weight on file to calculate BMI.  Treatment Plan Summary: Daily contact with patient to assess and evaluate symptoms and progress in treatment, Medication management, and Plan place Dietary consult for  possible supplementation recommendations given pt's continued refusal to food/drink  Disposition: Recommend psychiatric Inpatient admission when medically cleared. Supportive  therapy provided about ongoing stressors. Discussed crisis plan, support from social network, calling 911, coming to the Emergency Department, and calling Suicide Hotline.  This service was provided via telemedicine using a 2-way, interactive audio and video technology.  Names of all persons participating in this telemedicine service and their role in this encounter. Name: Oneida Alar Role: PMHNP  Name: Janine Limbo Role: Attending MD  Name: Ethelle Lyon Role: patient  Name:  Role:     Inda Merlin, NP 04/05/2022 12:03 PM

## 2022-04-05 NOTE — ED Notes (Signed)
Patient is still refusing her medications to eat or drink and VSs at times. I administered IM haldol and ativan based on the orders. Patient slept through the night after giving her medications.

## 2022-04-05 NOTE — ED Notes (Signed)
Patient IVC renewed by Dr. Melina Copa on 04/05/22, expires on 04/12/22.

## 2022-04-05 NOTE — ED Provider Notes (Signed)
Emergency Medicine Observation Re-evaluation Note  [name] is a 61 y.o. female, seen on rounds today.  Pt initially presented to the ED for complaints of IVC Currently, the patient is resting quietly.  Physical Exam  BP (!) 92/50 (BP Location: Left Arm)   Pulse 64   Temp 97.6 F (36.4 C) (Oral)   Resp 13   SpO2 94%  Physical Exam General: No acute distress Cardiac: Well-perfused Lungs: Nonlabored Psych: Calm at this time  ED Course / MDM  EKG:EKG Interpretation  Date/Time:  Tuesday March 31 2022 17:03:10 EDT Ventricular Rate:  70 PR Interval:  114 QRS Duration: 77 QT Interval:  566 QTC Calculation: 611 R Axis:   -71 Text Interpretation: Sinus rhythm Borderline short PR interval Left axis deviation Abnormal T, probable ischemia, anterior leads Prolonged QT interval Similar anterior  TW abnormality on past ECGs Confirmed by Gareth Morgan [phone]) on 04/01/2022 2:44:19 PM  I have reviewed the labs performed to date as well as medications administered while in observation.  Recent changes in the last 24 hours include patient continuing to refuse to eat or drink.  Received IV fluids.  Plan  Current plan is for inpatient psychiatric placement.    Hayden Rasmussen, MD 04/05/22 [phone]

## 2022-04-05 NOTE — Progress Notes (Signed)
Per Maxie Barb, NP, patient meets criteria for inpatient treatment. There are no available  beds at [facility] today. CSW re-faxed referrals to the following facilities for review:  [facility]  Pending - Request Sent [name] [address], [place] [address] [phone] --  [facility]  Pending - Request Sent [name] [address], [place] [address] [phone] --  [facility]  Pending - Request Sent [name] [address], [place] [address] [phone] --  [facility]  Pending - Request Sent [name] [address] [place] [address] [phone] --  [facility]  Pending - Request Sent [name] [address] [place] [address] [phone] --  [facility]  Pending - Request Sent [name] [address] [place], [place] [address] [phone] --  [facility]  Pending - Request Sent [name] [address] [place] [address] [phone] --  [facility]  Pending - Request Sent [name] [address] [place] [address] [phone] --  [facility]  Pending - Request Sent [name] [phone] [address] [place] [address] [phone] --  [facility]  Pending - Request Sent [name] [address], [place] [address] [phone] --  [facility]  Pending - Request Sent [name] [address] [place] [address] [phone] --  [facility]  Pending - Request Sent [name] [address], [place] [address] [phone] --  [facility]  Pending - Request Sent [name] [address] [place] [address] [phone] --  [facility]  Pending - Request Sent [name] [address], [place]  [place] [address] [phone] --  [facility]  Pending - Request Sent [name] [address], [place] [address] [phone] --  [facility]  Pending - Request Sent [name] [address], [place] [address] [phone] --  [facility]  Pending - Request Sent [name] [address] [place] [address]., [place] [address] [phone] --  [facility]  Pending - Request Sent [name] [address] [place] [address] [phone] --  [facility]  Pending - Request Sent [name] [address], [place] [address] [phone] --  [facility]  Pending - Request Sent [name] [address], [place] [address] [phone] --  [facility]  Pending - Request Sent [name] [address] [place] [address] [phone] --  [facility]  Pending - Request Sent [name] [address] [place] [address] [phone] --  [facility]  Pending - Request Sent [name] [address] [place], [place] [phone] --  [facility]  Pending - Request Sent [name] [address] [place] [address] [phone] --  [facility]  Pending - Request Sent [name] [address] [place], [place] [address] [phone] --  [facility]  Pending - Request Sent [name] [address] [place] [address] [phone] --  [facility]  Pending - Request Sent [name] [address] [place], [place] [address] [phone] --  [facility]  Pending - Request Sent [name] [address] [place] [address] [phone] --  [facility]  Pending - Request Sent [name] [address], [place] [address] [phone] --  [place] [name] [address], [place] [address] [phone] --  [place] [name] [address] [place] [address] [phone] --  [facility]  Pending - Request Sent [name] [address] [place] [address] [phone] --   TTS will continue to seek bed placement.  Glennie [place], MSW, [place] Phone: [phone] Disposition/TOC

## 2022-04-05 NOTE — ED Notes (Signed)
Patient continues to refuse to eat or drink.  Patient refused PO medication. Patient agitated that still in hospital, patient wants to go home.  Limited insight into illness at this time.

## 2022-04-06 DIAGNOSIS — F29 Unspecified psychosis not due to a substance or known physiological condition: Secondary | ICD-10-CM | POA: Diagnosis not present

## 2022-04-06 NOTE — ED Notes (Signed)
Pt refusing to eat breakfast. NT staff states that pt has eaten little to no food since being here. I entered room and attempted to explain to pt importance of nutrition. Pt guarded in her responses and states "this is malpractice". Pt offered morning medications but refusing to take anything PO. Orders require IM medications for Ativan and Haldol if refusing. Pt explained that we would need to do one or the other and requesting IM administration. See MAR.

## 2022-04-06 NOTE — ED Notes (Signed)
Patient refused medications and vital signs

## 2022-04-06 NOTE — ED Provider Notes (Signed)
Emergency Medicine Observation Re-evaluation Note  [name] is a 61 y.o. female, seen on rounds today.  Pt initially presented to the ED for complaints of IVC Currently, the patient is resting quietly.  Physical Exam  BP 118/69   Pulse 60   Temp 98 F (36.7 C)   Resp 12   SpO2 100%  Physical Exam General: No acute distress Cardiac: Well-perfused Lungs: Nonlabored Psych: No agitation  ED Course / MDM  EKG:EKG Interpretation  Date/Time:  Tuesday March 31 2022 17:03:10 EDT Ventricular Rate:  70 PR Interval:  114 QRS Duration: 77 QT Interval:  566 QTC Calculation: 611 R Axis:   -71 Text Interpretation: Sinus rhythm Borderline short PR interval Left axis deviation Abnormal T, probable ischemia, anterior leads Prolonged QT interval Similar anterior  TW abnormality on past ECGs Confirmed by Gareth Morgan [phone]) on 04/01/2022 2:44:19 PM  I have reviewed the labs performed to date as well as medications administered while in observation.  Recent changes in the last 24 hours include patient deemed to meet criteria for inpatient psychiatric treatment.   Plan  Current plan is for inpatient psychiatric placement.        Regan Lemming, MD 04/06/22 [phone]

## 2022-04-07 DIAGNOSIS — F29 Unspecified psychosis not due to a substance or known physiological condition: Secondary | ICD-10-CM | POA: Diagnosis not present

## 2022-04-07 LAB — BASIC METABOLIC PANEL
Anion gap: 15 (ref 5–15)
BUN: 22 mg/dL (ref 8–23)
CO2: 24 mmol/L (ref 22–32)
Calcium: 9.5 mg/dL (ref 8.9–10.3)
Chloride: 106 mmol/L (ref 98–111)
Creatinine, Ser: 0.85 mg/dL (ref 0.44–1.00)
GFR, Estimated: >60 mL/min (ref >60)
Glucose, Bld: 96 mg/dL (ref 70–99)
Potassium: 3.8 mmol/L (ref 3.5–5.1)
Sodium: 145 mmol/L (ref 135–145)

## 2022-04-07 LAB — RESP PANEL BY RT-PCR (FLU A&B, COVID) ARPGX2
Influenza A by PCR: NEGATIVE
Influenza B by PCR: NEGATIVE
SARS Coronavirus 2 by RT PCR: NEGATIVE

## 2022-04-07 LAB — MAGNESIUM: Magnesium: 2.2 mg/dL (ref 1.7–2.4)

## 2022-04-07 LAB — CBG MONITORING, ED: Glucose-Capillary: 91 mg/dL (ref 70–99)

## 2022-04-07 NOTE — ED Provider Notes (Signed)
Emergency Medicine Observation Re-evaluation Note  [name] is a 61 y.o. female, seen on rounds today.  Pt initially presented to the ED for complaints of IVC Currently, the patient is resting quietly.  Physical Exam  BP 97/64 (BP Location: Left Arm)   Pulse 64   Temp (!) 97.4 F (36.3 C) (Oral)   Resp 16   SpO2 98%  Physical Exam General: No acute distress Cardiac: Well-perfused Lungs: Nonlabored Psych: No agitation  ED Course / MDM  EKG:EKG Interpretation  Date/Time:  Tuesday March 31 2022 17:03:10 EDT Ventricular Rate:  70 PR Interval:  114 QRS Duration: 77 QT Interval:  566 QTC Calculation: 611 R Axis:   -71 Text Interpretation: Sinus rhythm Borderline short PR interval Left axis deviation Abnormal T, probable ischemia, anterior leads Prolonged QT interval Similar anterior  TW abnormality on past ECGs Confirmed by Gareth Morgan [phone]) on 04/01/2022 2:44:19 PM  I have reviewed the labs performed to date as well as medications administered while in observation.  Recent changes in the last 24 hours include patient has intermittently been refusing oral medications and food and drink. Per psychiatry notes, "continue to monitor if patient continues to refuse to eat or drink. Pt may require medical admission if she continues to refuse to eat or drink." Will check CBG, morning BMP and Mg.   Laboratory evaluation is reassuring, CBG normal, BMP without evidence of an AKI, no significant electrolyte abnormality.  Patient appears well-hydrated on exam despite refusing to drink fluids.  Continues to remain medically clear for psychiatric admission.  Plan  Current plan is for inpatient psychiatric placement.        Regan Lemming, MD 04/06/22 6045    Regan Lemming, MD 04/07/22 [phone]

## 2022-04-07 NOTE — ED Notes (Signed)
Patient slept all night. Refused meds did not eat or drink.

## 2022-04-07 NOTE — Progress Notes (Signed)
Inpatient Behavioral Health Placement  Pt continues to meet inpatient criteria. There are no available beds at [facility] and [facility] is unable to accept today, however CSW will follow up tomorrow.   Referral was sent to the following facilities;   Destination Service Provider Address Phone Fax  [place] [place] [address] [phone]  [facility]  [address] [place] [address] [phone] [place], [place] [address] [place]  [place] [place] [address] [phone]  [facility]  [address] [place] [address] [phone] [facility]  [place], [place] [address] [phone] [facility]  [place], [place] [address] [phone]  [facility]  [place], [place] [address] [phone] [facility]  [address] [place] [place] [address] [place]  [facility]  [address] [place] [address] [phone]  [facility]  [address] [place] [address] [phone]  [facility]  [place] [address], [place] [address] [phone]  [facility]  [address], [place] [address] [phone] [place]  [address], [place] [address] [phone] [facility]  [address], [place] [address] [phone] [facility]  [address] [place] [address] [place]  [address], [place] [address] [phone] [facility]  [address], [place] [address] [phone] [facility]  [address], [place] [address] [phone] [facility]  [address], [place] [address] [phone] [facility]  [place], [place] [address] [phone]  [facility]  [address] [place] [address] [place]  [facility]  [address] [place] [address] [phone]  [facility]  [address], [place] [address] [phone] [facility]  [address] [place], [place]  [address] [phone]  [facility] [address] [place] [address] [phone] [place]  [place], [place] [address] [phone] [facility]  [place]., [place] [address] [place]  [facility]  [address] [place] [address] [phone]  [facility]  [address], [place] [address] [phone] [place]  [address], [place] [address] [phone] [facility]  [address] [place] [address] [phone]  [facility]  [place]., [place] [address] [phone]  [facility]        Situation ongoing,  CSW will follow up.   Benjaman Kindler, MSW, LCSWA 04/07/2022  @ 7:10 PM

## 2022-04-07 NOTE — Progress Notes (Signed)
Inpatient Behavioral Health Placement  Pt continues to meet inpatient behavioral health placement. There are no available  beds at [facility]. Referral was sent to the following facilities;    Destination Service Provider Address Phone Fax  [place] [place] [address] [phone]  [facility]  [address] [place] [address] [phone] [place], [address] [address] [place]  [place]., [place] [address] [phone]  [facility]  [address] [place] [address] [phone] [facility]  [place], [place] [address] [phone] [facility]  [place], [place] [address] [phone]  [facility]  [place], [place] [address] [phone] [facility]  [address] [place]., [place] [address] [place]  [facility]  [address] [place] [address] [phone]  [facility]  [address], [place] [address] [phone]  [facility]  [place] [address], [place] [address] [phone]  [name]  [address], [place] [address] [phone] [place]  [address] [place] [address] [phone] [facility]  [address], [place] [address] [phone] [facility]  [address] [place] [address] [place]  [address] [place] [address] [phone]  [facility]  [address], [place] [address] [phone] [facility]  [address] [place] [address] [phone] [facility]  [address] [place] [address] [phone] [facility]  [place], [place] [address] [phone]  [facility]  [address] [place] [address] [place]  [place]  [address], [place] [address] [phone]  [facility]  [address], [place] [address] [phone] [facility]  [address] [place], [place] [address] [phone]  [facility] Dr[address] [place] [address] [phone] [place]  [place], [place] [address] [phone] [facility]  [place]., [place] [address] [place]  [facility]  [address] [place] [address] [phone]  [facility]  [address], [place] [address] [phone] [place]  [address] [place] [address] [phone] [place]  [address], [place] [address] [phone]  [facility]  [address], [place] [address] [phone]   Situation ongoing,  CSW will follow up.   [name], MSW, LCSWA 04/07/2022  @ 3:47 AM

## 2022-04-08 ENCOUNTER — Inpatient Hospital Stay
Admission: AD | Admit: 2022-04-08 | Discharge: 2022-08-07 | DRG: 885 | Disposition: A | Discharge status: Home / Self Care | Payer: Medicare Other | Source: Intra-hospital | Attending: Psychiatry | Admitting: Psychiatry

## 2022-04-08 ENCOUNTER — Encounter: Payer: Self-pay | Admitting: Psychiatry

## 2022-04-08 ENCOUNTER — Other Ambulatory Visit: Payer: Self-pay

## 2022-04-08 DIAGNOSIS — F251 Schizoaffective disorder, depressive type: Principal | ICD-10-CM | POA: Diagnosis present

## 2022-04-08 DIAGNOSIS — G4700 Insomnia, unspecified: Secondary | ICD-10-CM | POA: Diagnosis present

## 2022-04-08 DIAGNOSIS — F419 Anxiety disorder, unspecified: Secondary | ICD-10-CM | POA: Diagnosis present

## 2022-04-08 DIAGNOSIS — F200 Paranoid schizophrenia: Secondary | ICD-10-CM | POA: Diagnosis not present

## 2022-04-08 DIAGNOSIS — F17210 Nicotine dependence, cigarettes, uncomplicated: Secondary | ICD-10-CM | POA: Diagnosis present

## 2022-04-08 DIAGNOSIS — Z79899 Other long term (current) drug therapy: Secondary | ICD-10-CM

## 2022-04-08 DIAGNOSIS — Z91148 Patient's other noncompliance with medication regimen for other reason: Secondary | ICD-10-CM | POA: Diagnosis not present

## 2022-04-08 DIAGNOSIS — Z20822 Contact with and (suspected) exposure to covid-19: Secondary | ICD-10-CM | POA: Diagnosis not present

## 2022-04-08 DIAGNOSIS — E569 Vitamin deficiency, unspecified: Secondary | ICD-10-CM | POA: Diagnosis present

## 2022-04-08 DIAGNOSIS — F29 Unspecified psychosis not due to a substance or known physiological condition: Secondary | ICD-10-CM | POA: Diagnosis not present

## 2022-04-08 MED ORDER — ALUM & MAG HYDROXIDE-SIMETH 200-200-20 MG/5ML PO SUSP: 30.0000 mL | ORAL | Status: DC | PRN

## 2022-04-08 MED ORDER — HALOPERIDOL LACTATE 5 MG/ML IJ SOLN: 5.0000 mg | Freq: Two times a day (BID) | INTRAMUSCULAR | Status: DC

## 2022-04-08 MED ORDER — LORAZEPAM 1 MG PO TABS
1.0000 mg | ORAL_TABLET | Freq: Two times a day (BID) | ORAL | Status: DC
  Administered 2022-04-08 – 2022-08-07 (×242): 1 mg via ORAL
  Filled 2022-04-08 (×243): qty 1

## 2022-04-08 MED ORDER — LORAZEPAM 2 MG/ML IJ SOLN: 1.0000 mg | Freq: Two times a day (BID) | INTRAMUSCULAR | Status: DC

## 2022-04-08 MED ORDER — ADULT MULTIVITAMIN W/MINERALS CH
1.0000 | ORAL_TABLET | Freq: Every day | ORAL | Status: DC
  Administered 2022-04-09 – 2022-08-07 (×121): 1 via ORAL
  Filled 2022-04-08 (×120): qty 1

## 2022-04-08 MED ORDER — NICOTINE 7 MG/24HR TD PT24
7.0000 mg | MEDICATED_PATCH | Freq: Every day | TRANSDERMAL | Status: DC
  Filled 2022-04-08 (×6): qty 1

## 2022-04-08 MED ORDER — MIRTAZAPINE 15 MG PO TABS
7.5000 mg | ORAL_TABLET | Freq: Every day | ORAL | Status: DC
  Administered 2022-04-08: 7.5 mg via ORAL
  Filled 2022-04-08: qty 1

## 2022-04-08 MED ORDER — ACETAMINOPHEN 325 MG PO TABS: 650.0000 mg | ORAL_TABLET | Freq: Four times a day (QID) | ORAL | Status: DC | PRN

## 2022-04-08 MED ORDER — THIAMINE MONONITRATE 100 MG PO TABS
100.0000 mg | ORAL_TABLET | Freq: Every day | ORAL | Status: DC
  Administered 2022-04-09 – 2022-08-07 (×121): 100 mg via ORAL
  Filled 2022-04-08 (×122): qty 1

## 2022-04-08 MED ORDER — MAGNESIUM HYDROXIDE 400 MG/5ML PO SUSP: 30.0000 mL | Freq: Every day | ORAL | Status: DC | PRN

## 2022-04-08 MED ORDER — HALOPERIDOL 5 MG PO TABS
5.0000 mg | ORAL_TABLET | Freq: Two times a day (BID) | ORAL | Status: DC
  Administered 2022-04-08 – 2022-04-27 (×38): 5 mg via ORAL
  Filled 2022-04-08 (×38): qty 1

## 2022-04-08 NOTE — Progress Notes (Signed)
Pt was accept to [facility] 04/08/22; BED ASSIGNMENT 309  Pt meets inpatient criteria per Lindon Romp, NP  Attending Physician will be Dr. Alethia Berthold  Report can be called to: [phone]  Pt can arrive: BED IS [facility] Team Notified: Lindon Romp, NP, Alethia Berthold, MD, Benjamin Stain, RN, [facility], LCAS, Marylee Floras, Nicoletta Ba, [place], Gigi Salomon Fick, RN, Gwenith Daily, RN  [place], [place] 04/08/2022 @ 10:24 AM

## 2022-04-08 NOTE — ED Notes (Signed)
Patient was more pleasant this shift than the other shifts I have been her nurse. He still refused to eat or drink and take her PO medications. Per order, I gave her IM haldol and ativan. Patient has rested comfortably the rest of the shift.

## 2022-04-08 NOTE — ED Notes (Signed)
Pt refused to eat her meal again this morning RN notify

## 2022-04-08 NOTE — ED Notes (Signed)
Patient degreed to talk shower for this tech today.pt been refusing all care I'm happy

## 2022-04-08 NOTE — Tx Team (Signed)
Initial Treatment Plan 04/08/2022 6:21 PM [name] EVO:[id number]    PATIENT STRESSORS: Marital or family conflict     PATIENT STRENGTHS: Supportive family/friends    PATIENT IDENTIFIED PROBLEMS: Paranoia                      DISCHARGE CRITERIA:  Adequate post-discharge living arrangements Improved stabilization in mood, thinking, and/or behavior Verbal commitment to aftercare and medication compliance  PRELIMINARY DISCHARGE PLAN: Attend PHP/IOP Outpatient therapy Return to previous living arrangement  PATIENT/FAMILY INVOLVEMENT: This treatment plan has been presented to and reviewed with the patient, [name].  The patient has been given the opportunity to ask questions and make suggestions.  Gerrianne Scale, RN 04/08/2022, 6:21 PM

## 2022-04-08 NOTE — ED Notes (Signed)
Pt lying in bed. Refusing to eat/drink anything at breakfast despite encouragement.

## 2022-04-08 NOTE — Progress Notes (Addendum)
Admission note: Pt was brought to [facility] by police. On admission, pt as guarded and not very forthcoming of events that lead up to being admitted to the hospital. She stated something like "Someone came on to my property, on to my land and I asked them to leave because they were reaching for my grandmas head and the police were called." Pt was hard to understand so when asked to clarify the pt became irritable and stated, "listen ma'am, listen. I am tired of repeating myself to people." Pt stated that her stressor was "I am tired of [place] fucking with me." Pt stated again that [employer] was harassing her. Pt states that she has no complaints but is paranoid. When asked when she was paranoid of, she was hard to understand but stated something like, "people I don't care to know and put me here." Pt seems to be a bad historian. Pt states she wants to just go home. Pt does have a legal guardian. Pt stated it was her grandmother and that is who she lives with. It is actually her sister who is the legal guardian. Pt seemed to be confused. When asked about her support she stated, " I don't want to give all my support." Pt is disorganized in speech. Pt is somewhat redirectable. Pt stated that her last BM was 7 days ago and that she did not want any help. She stated, "I will just let it flow." Pt states she will not take medication and has been getting IM's when she was at the ED. Consents signed, handbook detailing the patient's rights, responsibilities, and visitor guidelines provided. Skin/belongings search completed and patient oriented to unit. Patient stable at this time. Patient given the opportunity to express concerns and ask questions. Patient given toiletries. Pt took a shower. Pt was appreciative after assessment. Will continue to monitor.   Problem: Education: Goal: Will be free of psychotic symptoms Outcome: Not Progressing   Problem: Coping: Goal: Coping ability will improve Outcome: Not  Progressing  04/08/22 1710  Psych Admission Type (Psych Patients Only)  Admission Status Involuntary  Psychosocial Assessment  Patient Complaints None  Eye Contact Poor  Facial Expression Pensive;Anxious  Affect Anxious;Irritable;Sad  Speech Soft;Slurred;Rapid  Interaction Guarded  Motor Activity Restless  Appearance/Hygiene In scrubs;Unremarkable  Behavior Characteristics Cooperative;Appropriate to situation;Anxious;Irritable  Mood Irritable;Sad;Anxious  Thought Process  Coherency Disorganized  Content Blaming others;Paranoia  Delusions Paranoid  Perception WDL  Hallucination None reported or observed  Judgment Poor  Confusion None  Danger to Self  Current suicidal ideation? Denies  Danger to Others  Danger to Others None reported or observed

## 2022-04-08 NOTE — ED Notes (Signed)
Pt currently sleeping. Will obtain v/s once she wakes or prior to shift change, whichever occurs first. RN agrees with this plan.

## 2022-04-08 NOTE — ED Provider Notes (Signed)
Emergency Medicine Observation Re-evaluation Note  [name] is a 61 y.o. female, seen on rounds today.  Pt initially presented to the ED for complaints of IVC Currently, the patient is resting quietly.  Physical Exam  BP 103/67 (BP Location: Left Arm)   Pulse 72   Temp (!) 97.4 F (36.3 C) (Oral)   Resp 16   SpO2 100%  Physical Exam General: No acute distress Cardiac: Well-perfused Lungs: Nonlabored Psych: No agitation  ED Course / MDM  EKG:EKG Interpretation  Date/Time:  Tuesday March 31 2022 17:03:10 EDT Ventricular Rate:  70 PR Interval:  114 QRS Duration: 77 QT Interval:  566 QTC Calculation: 611 R Axis:   -71 Text Interpretation: Sinus rhythm Borderline short PR interval Left axis deviation Abnormal T, probable ischemia, anterior leads Prolonged QT interval Similar anterior  TW abnormality on past ECGs Confirmed by Gareth Morgan [phone]) on 04/01/2022 2:44:19 PM  I have reviewed the labs performed to date as well as medications administered while in observation.  Recent changes in the last 24 hours include None   Plan  Current plan is for inpatient psychiatric placement.         Deno Etienne, DO 04/08/22 [phone]

## 2022-04-08 NOTE — BH Assessment (Signed)
Patient is to be admitted to [facility] BMU today 04/08/22 by Dr. Weber Cooks.  Attending Physician will be Dr.  Weber Cooks .   Patient has been assigned to room 309, by [place], Junita Push.    ER staff is aware of the admission:  Renita, Patient Access.

## 2022-04-08 NOTE — ED Notes (Signed)
Pt in shower at this time

## 2022-04-09 DIAGNOSIS — F251 Schizoaffective disorder, depressive type: Secondary | ICD-10-CM | POA: Diagnosis not present

## 2022-04-09 MED ORDER — OLANZAPINE 5 MG PO TABS
15.0000 mg | ORAL_TABLET | Freq: Every day | ORAL | Status: DC
  Administered 2022-04-09 – 2022-04-14 (×5): 15 mg via ORAL
  Filled 2022-04-09 (×5): qty 1

## 2022-04-09 MED ORDER — MIRTAZAPINE 15 MG PO TABS
15.0000 mg | ORAL_TABLET | Freq: Every day | ORAL | Status: DC
  Administered 2022-04-09 – 2022-04-28 (×16): 15 mg via ORAL
  Filled 2022-04-09 (×17): qty 1

## 2022-04-09 MED ORDER — BOOST / RESOURCE BREEZE PO LIQD CUSTOM
1.0000 | Freq: Four times a day (QID) | ORAL | Status: DC
  Administered 2022-04-09 – 2022-05-04 (×35): 1 via ORAL

## 2022-04-09 NOTE — Progress Notes (Signed)
Patient is isolative to her room.  She has been in room lying in bed since the start of the shift.  Upon approach she is pleasant and cooperative.  Agreed to take her medication if she could take it with another Gatorade. Medication and Gatorade provided.  Patient took her medication without incident. Admitted that she has not ben eating and drinking  like she needs to.  Proceeded to drink the whole bottle of Gatorade.  Staff offered her the opportunity to get some snacks to eat, but patient declined.  Support and encouragement offered.  Advised her to reach out to staff with any needs or concerns she may have.  Will continue to monitor with q 15 minute safety checks.       C Butler-Nicholson, LPN

## 2022-04-09 NOTE — Progress Notes (Addendum)
NUTRITION ASSESSMENT  Pt identified as at risk on the Malnutrition Screen Tool  INTERVENTION:  -Boost Breeze po QID, each supplement provides 250 kcal and 9 grams of protein  -MVI with minerals daily  NUTRITION DIAGNOSIS: Unintentional weight loss related to sub-optimal intake as evidenced by pt report.   Goal: Pt to meet >/= 90% of their estimated nutrition needs.  Monitor:  PO intake  Assessment:  Pt admitted for IVC.   Pt with schizophrenia and is familiar to this RD due to prior admissions. Per chart review, pt is not eating much, but drinking fluids such as gatorade. She refused offered snacks. Pt currently on a regular diet. No meal completion data available to assess at this time.   Reviewed wt hx; pt has experienced a 9.2% wt loss over the past 6 months. While this is not significant for time frame, it is concerning given pt's underweight status and history of malnutrition. Suspect malnutrition ongoing. Pt does not like milky supplements, but has accepted Boost Breeze well in the past.   Medications reviewed and include ativan, haldol, remeron, and thiamine.   Labs reviewed: CBGS: 41.  61 y.o. female  Height: Ht Readings from Last 1 Encounters:  04/08/22 5\' 4"  (1.626 m)    Weight: Wt Readings from Last 1 Encounters:  04/08/22 40.4 kg    Weight Hx: Wt Readings from Last 10 Encounters:  04/08/22 40.4 kg  10/17/21 44.5 kg  10/16/21 47.6 kg  09/28/21 43.3 kg  10/05/20 49.9 kg  04/04/20 52.2 kg  03/26/20 50.3 kg  03/25/20 50.3 kg  11/17/17 68 kg  04/12/15 64 kg    BMI:  Body mass index is 15.28 kg/m. Pt meets criteria for underweight based on current BMI.  Estimated Nutritional Needs: Kcal: 40-45 kcal/kg Protein: > 2-2.5 gram protein/kg Fluid: 1 ml/kcal  Diet Order:  Diet Order             Diet regular Room service appropriate? Yes; Fluid consistency: Thin  Diet effective now                  Pt is also offered choice of unit snacks  mid-morning and mid-afternoon.  Pt is eating as desired.   Lab results and medications reviewed.   Loistine Chance, RD, LDN, [place] Registered Dietitian II Certified Diabetes Care and Education Specialist Please refer to [facility] for RD and/or RD on-call/weekend/after hours pager

## 2022-04-09 NOTE — H&P (Signed)
Psychiatric Admission Assessment Adult  Patient Identification: [name] MRN:  [id number] Date of Evaluation:  04/09/2022 Chief Complaint:  Schizoaffective disorder, depressive type (HCC) [F25.1] Principal Diagnosis: Schizoaffective disorder, depressive type (HCC) Diagnosis:  Principal Problem:   Schizoaffective disorder, depressive type (HCC)  History of Present Illness: Patient seen and chart reviewed.  This is a 61 year old woman known from previous encounters with has a history of schizoaffective disorder.  She was brought to the hospital in [place] over a week ago by family out of concern that she is becoming increasingly aggressive at home and is unmanageable and is a danger to her mother with whom she lives.  Patient apparently had not been fully compliant with medicine and also had been eating less than is necessary and losing weight again.  On interview today the patient spoke in near whisper that was disorganized in content and impaired by dry mouth such that I could barely understand her.  She was not hostile to me but clearly irritated talking about her family.  Talking about how people were invading her house.  Patient denied any suicidal or homicidal thought.  Denied hallucinations.  She has mostly been withdrawn since coming here.  She claims she is eating but admits she is not eating very much.  Denies that she is taking any medicine.  Denies drug abuse. Associated Signs/Symptoms: Depression Symptoms:  difficulty concentrating, anxiety, Duration of Depression Symptoms: Greater than two weeks  (Hypo) Manic Symptoms:  Impulsivity, Irritable Mood, Anxiety Symptoms:  Excessive Worry, Psychotic Symptoms:  Delusions, Paranoia, PTSD Symptoms: Negative Total Time spent with patient: 45 minutes  Past Psychiatric History: Patient has a history of schizoaffective disorder with chronic mental health problems.  Recently seems to have decompensated.  She had an admission to our  psychiatric ward on the geriatric unit earlier this summer and had ECT because at that time she had gotten to the point of being catatonic.  Previously had been apparently relatively stable on antipsychotics.  No known history of suicide attempts  Is the patient at risk to self? Yes.    Has the patient been a risk to self in the past 6 months? Yes.    Has the patient been a risk to self within the distant past? Yes.    Is the patient a risk to others? Yes.    Has the patient been a risk to others in the past 6 months? Yes.    Has the patient been a risk to others within the distant past? Yes.     [place] Scale:  Flowsheet Row Admission (Current) from 04/08/2022 in [facility] INPATIENT BEHAVIORAL MEDICINE Admission (Discharged) from 10/17/2021 in [facility] BEHAVIORAL MEDICINE Admission (Discharged) from 10/02/2021 in [facility] CARDIAC MED PCU  C-SSRS RISK CATEGORY No Risk No Risk No Risk        Prior Inpatient Therapy:   Prior Outpatient Therapy:    Alcohol Screening: 1. How often do you have a drink containing alcohol?: Never 2. How many drinks containing alcohol do you have on a typical day when you are drinking?: 1 or 2 3. How often do you have six or more drinks on one occasion?: Never AUDIT-C Score: 0 4. How often during the last year have you found that you were not able to stop drinking once you had started?: Never 5. How often during the last year have you failed to do what was normally expected from you because of drinking?: Never 6. How often during the last year have you  needed a first drink in the morning to get yourself going after a heavy drinking session?: Never 7. How often during the last year have you had a feeling of guilt of remorse after drinking?: Never 8. How often during the last year have you been unable to remember what happened the night before because you had been drinking?: Never 9. Have you or someone else been injured as a result of your drinking?:  No 10. Has a relative or friend or a doctor or another health worker been concerned about your drinking or suggested you cut down?: No Alcohol Use Disorder Identification Test Final Score (AUDIT): 0 Substance Abuse History in the last 12 months:  No. Consequences of Substance Abuse: Negative Previous Psychotropic Medications: Yes  Psychological Evaluations: Yes  Past Medical History:  Past Medical History:  Diagnosis Date   Anemia 10/02/2021   Non compliance w medication regimen    Schizophrenia (HCC)     Past Surgical History:  Procedure Laterality Date   BIOPSY  09/25/2021   Procedure: BIOPSY;  Surgeon: Rachael Fee, MD;  Location: [facility];  Service: Gastroenterology;;   ESOPHAGOGASTRODUODENOSCOPY (EGD) WITH PROPOFOL [name] 09/25/2021   Procedure: ESOPHAGOGASTRODUODENOSCOPY (EGD) WITH PROPOFOL;  Surgeon: Rachael Fee, MD;  Location: [facility];  Service: Gastroenterology;  Laterality: [name];  With NGT placement   Family History: History reviewed. No pertinent family history. Family Psychiatric  History: None reported Tobacco Screening:   Social History:  Social History   Substance and Sexual Activity  Alcohol Use Not Currently   Comment: Refuses to disclose how much     Social History   Substance and Sexual Activity  Drug Use No   Comment: Refuses to answer    Additional Social History:                           Allergies:   Allergies  Allergen Reactions   Aspirin Hives and Other (See Comments)    Possibly caused hives, per sister   Ziprasidone Hcl Rash and Other (See Comments)    Caused twitching. Redness of eyes.    Lab Results:  Results for orders placed or performed during the hospital encounter of 03/29/22 (from the past 48 hour(s))  Resp Panel by RT-PCR (Flu A&B, Covid) Anterior Nasal Swab     Status: None   Collection Time: 04/07/22  5:43 PM   Specimen: Anterior Nasal Swab  Result Value Ref Range   SARS Coronavirus 2 by RT PCR NEGATIVE  NEGATIVE    Comment: (NOTE) SARS-CoV-2 target nucleic acids are NOT DETECTED.  The SARS-CoV-2 RNA is generally detectable in upper respiratory specimens during the acute phase of infection. The lowest concentration of SARS-CoV-2 viral copies this assay can detect is 138 copies/mL. A negative result does not preclude SARS-Cov-2 infection and should not be used as the sole basis for treatment or other patient management decisions. A negative result may occur with  improper specimen collection/handling, submission of specimen other than nasopharyngeal swab, presence of viral mutation(s) within the areas targeted by this assay, and inadequate number of viral copies(<138 copies/mL). A negative result must be combined with clinical observations, patient history, and epidemiological information. The expected result is Negative.  Fact Sheet for Patients:  BloggerCourse.com  Fact Sheet for Healthcare Providers:  SeriousBroker.it  This test is no t yet approved or cleared by the [place] FDA and  has been authorized for detection and/or diagnosis of SARS-CoV-2 by FDA under  an Emergency Use Authorization (EUA). This EUA will remain  in effect (meaning this test can be used) for the duration of the COVID-19 declaration under Section 564(b)(1) of the Act, 21 U.S.C.section 360bbb-3(b)(1), unless the authorization is terminated  or revoked sooner.       Influenza A by PCR NEGATIVE NEGATIVE   Influenza B by PCR NEGATIVE NEGATIVE    Comment: (NOTE) The Xpert Xpress SARS-CoV-2/FLU/RSV plus assay is intended as an aid in the diagnosis of influenza from Nasopharyngeal swab specimens and should not be used as a sole basis for treatment. Nasal washings and aspirates are unacceptable for Xpert Xpress SARS-CoV-2/FLU/RSV testing.  Fact Sheet for Patients: BloggerCourse.com  Fact Sheet for Healthcare  Providers: SeriousBroker.it  This test is not yet approved or cleared by the [place] FDA and has been authorized for detection and/or diagnosis of SARS-CoV-2 by FDA under an Emergency Use Authorization (EUA). This EUA will remain in effect (meaning this test can be used) for the duration of the COVID-19 declaration under Section 564(b)(1) of the Act, 21 U.S.C. section 360bbb-3(b)(1), unless the authorization is terminated or revoked.  Performed at [facility], [address], [place], [place] [address]     Blood Alcohol level:  Lab Results  Component Value Date   ETH <10 03/29/2022   ETH <10 09/12/2021    Metabolic Disorder Labs:  Lab Results  Component Value Date   HGBA1C 5.6 10/21/2021   MPG 114 10/21/2021   MPG 111 10/02/2021   Lab Results  Component Value Date   PROLACTIN 13.6 04/16/2015   Lab Results  Component Value Date   CHOL 189 10/21/2021   TRIG 52 10/21/2021   HDL 50 10/21/2021   CHOLHDL 3.8 10/21/2021   VLDL 10 10/21/2021   LDLCALC 129 (H) 10/21/2021   LDLCALC 93 10/01/2020    Current Medications: Current Facility-Administered Medications  Medication Dose Route Frequency Provider Last Rate Last Admin   acetaminophen (TYLENOL) tablet 650 mg  650 mg Oral Q6H PRN Tyse Auriemma T, MD       alum & mag hydroxide-simeth (MAALOX/MYLANTA) 200-200-20 MG/5ML suspension 30 mL  30 mL Oral Q4H PRN Kemoni Ortega, [name], MD       feeding supplement (BOOST / RESOURCE BREEZE) liquid 1 Container  1 Container Oral QID Arion Shankles, [name], MD   1 Container at 04/09/22 1136   haloperidol (HALDOL) tablet 5 mg  5 mg Oral BID Elberta Lachapelle, [name], MD   5 mg at 04/09/22 4315   Or   haloperidol lactate (HALDOL) injection 5 mg  5 mg Intramuscular BID Venie Montesinos T, MD       LORazepam (ATIVAN) tablet 1 mg  1 mg Oral BID Suanne Minahan T, MD   1 mg at 04/09/22 4008   Or   LORazepam (ATIVAN) injection 1 mg  1 mg Intramuscular BID Eddye Broxterman,  Kamiryn Bezanson T, MD       magnesium hydroxide (MILK OF MAGNESIA) suspension 30 mL  30 mL Oral Daily PRN Rodney Wigger T, MD       mirtazapine (REMERON) tablet 15 mg  15 mg Oral QHS Varvara Legault T, MD       multivitamin with minerals tablet 1 tablet  1 tablet Oral Daily Ceriah Kohler, [name], MD   1 tablet at 04/09/22 6761   nicotine (NICODERM CQ - dosed in mg/24 hr) patch 7 mg  7 mg Transdermal Daily Aleisa Howk, [name], MD       OLANZapine (ZYPREXA) tablet  15 mg  15 mg Oral QHS Spencer Cardinal T, MD       thiamine (VITAMIN B1) tablet 100 mg  100 mg Oral Daily Gearl Kimbrough T, MD   100 mg at 04/09/22 3267   PTA Medications: Medications Prior to Admission  Medication Sig Dispense Refill Last Dose   clonazePAM (KLONOPIN) 0.5 MG tablet Take 0.5 tablets (0.25 mg total) by mouth 2 (two) times daily. (Patient not taking: Reported on 03/30/2022) 60 tablet 0    haloperidol (HALDOL) 5 MG tablet Take 1 tablet (5 mg total) by mouth 2 (two) times daily. (Patient not taking: Reported on 03/30/2022) 60 tablet 3    midodrine (PROAMATINE) 2.5 MG tablet Take 1 tablet (2.5 mg total) by mouth 3 (three) times daily with meals. (Patient not taking: Reported on 03/30/2022)  0    mirtazapine (REMERON) 15 MG tablet Take 0.5 tablets (7.5 mg total) by mouth at bedtime. (Patient not taking: Reported on 03/30/2022) 30 tablet 2    Multiple Vitamin (MULTIVITAMIN WITH MINERALS) TABS tablet Take 1 tablet by mouth daily. (Patient not taking: Reported on 03/30/2022)      OLANZapine (ZYPREXA) 5 MG tablet Take 1 tablet (5 mg total) by mouth at bedtime. (Patient not taking: Reported on 03/30/2022) 30 tablet 2    thiamine 100 MG tablet Take 1 tablet (100 mg total) by mouth daily. (Patient not taking: Reported on 03/30/2022)       Musculoskeletal: Strength & Muscle Tone: within normal limits Gait & Station: normal Patient leans: [name]            Psychiatric Specialty Exam:  Presentation  General Appearance: Disheveled  Eye  Contact:Fair  Speech:Pressured  Speech Volume:Increased  Handedness:Right   Mood and Affect  Mood:Irritable; Labile; Angry  Affect:Blunt; Labile   Thought Process  Thought Processes:Disorganized; Irrevelant  Duration of Psychotic Symptoms: Greater than six months  Past Diagnosis of Schizophrenia or Psychoactive disorder: Yes  Descriptions of Associations:Loose  Orientation:Partial  Thought Content:Illogical; Rumination; Scattered; Tangential; Delusions  Hallucinations:No data recorded Ideas of Reference:Percusatory; Paranoia  Suicidal Thoughts:No data recorded Homicidal Thoughts:No data recorded  Sensorium  Memory:Immediate Fair; Recent Fair; Remote Fair  Judgment:Impaired  Insight:Poor; Lacking   Executive Functions  Concentration:Poor  Attention Span:Poor  Recall:Poor  Fund of Knowledge:Poor  Language:Poor   Psychomotor Activity  Psychomotor Activity:No data recorded  [place]; Resilience; Housing; Social Support   Sleep  Sleep:No data recorded   Physical Exam: Physical Exam Vitals and nursing note reviewed.  Constitutional:      Appearance: Normal appearance.  HENT:     Head: Normocephalic and atraumatic.     Mouth/Throat:     Pharynx: Oropharynx is clear.  Eyes:     Pupils: Pupils are equal, round, and reactive to light.  Cardiovascular:     Rate and Rhythm: Normal rate and regular rhythm.  Pulmonary:     Effort: Pulmonary effort is normal.     Breath sounds: Normal breath sounds.  Abdominal:     General: Abdomen is flat.     Palpations: Abdomen is soft.  Musculoskeletal:        General: Normal range of motion.  Skin:    General: Skin is warm and dry.  Neurological:     General: No focal deficit present.     Mental Status: She is alert. Mental status is at baseline.  Psychiatric:        Attention and Perception: Attention normal.        Mood  and Affect: Mood normal. Affect is blunt and  inappropriate.        Speech: Speech is delayed.        Behavior: Behavior is slowed.        Thought Content: Thought content is paranoid.        Cognition and Memory: Cognition is impaired.        Judgment: Judgment is inappropriate.    Review of Systems  Constitutional: Negative.   HENT: Negative.    Eyes: Negative.   Respiratory: Negative.    Cardiovascular: Negative.   Gastrointestinal: Negative.   Musculoskeletal: Negative.   Skin: Negative.   Neurological: Negative.   Psychiatric/Behavioral:  The patient is nervous/anxious.    Blood pressure 96/67, pulse 65, temperature 98.2 F (36.8 C), temperature source Oral, resp. rate 18, height 5\' 4"  (1.626 m), weight 40.4 kg, SpO2 99 %. Body mass index is 15.28 kg/m.  Treatment Plan Summary: Medication management and Plan this is a 61 year old woman with chronic psychotic disorder who when she becomes unwell becomes paranoid and also stopped eating and drinking as well as stopping being compliant with medicine.  Patient is not dizzy and is able to walk around to take care of her ADLs and has been consuming a little bit by mouth but not enough.  Still losing weight.  Paranoid and disorganized.  Reviewed medicine.  Restart Zyprexa.  Continue other antipsychotics.  We will get collateral information from sister.  Daily assessment of vital signs and daily assessment of health.  At this point I suggested ECT to the patient but she is not willing and she is not yet catatonic so we will see if antipsychotics can treat her adequately.  Observation Level/Precautions:  15 minute checks  Laboratory:  Chemistry Profile  Psychotherapy:    Medications:    Consultations:    Discharge Concerns:    Estimated LOS:  Other:     Physician Treatment Plan for Primary Diagnosis: Schizoaffective disorder, depressive type (HCC) Long Term Goal(s): Improvement in symptoms so as ready for discharge  Short Term Goals: Ability to verbalize feelings will improve,  Ability to demonstrate self-control will improve, and Ability to identify and develop effective coping behaviors will improve  Physician Treatment Plan for Secondary Diagnosis: Principal Problem:   Schizoaffective disorder, depressive type (HCC)  Long Term Goal(s): Improvement in symptoms so as ready for discharge  Short Term Goals: Compliance with prescribed medications will improve  I certify that inpatient services furnished can reasonably be expected to improve the patient's condition.    Mordecai RasmussenJohn Vallory Oetken, MD 9/21/20232:47 PM

## 2022-04-09 NOTE — [facility] Counselor (Signed)
CSW did speak with the patient's guardian, Kizzie Cotten, [phone].  Guardian reports that she was unaware that the patient was moved from [facility] and was upset at not being notified.   CSW provided empathy.  Guardian had questions on patient eating and drinking.  CSW spoke with Bella [name] RN who reports that she believes she saw the patient eating earlier and drinking a lot of fluids.  CSW updated the guardian.   CSW unable to complete the PSA with guardian at this time due to guardian's request.  She asks that CSW contact on 04/10/2022 after [place], MSW, LCSW 04/09/2022 3:51 PM

## 2022-04-09 NOTE — Plan of Care (Signed)
  Problem: Nutrition: Goal: Adequate nutrition will be maintained Outcome: Progressing   Problem: Health Behavior/Discharge Planning: Goal: Compliance with prescribed medication regimen will improve Outcome: Progressing   Problem: Nutritional: Goal: Ability to achieve adequate nutritional intake will improve Outcome: Progressing   Problem: Role Relationship: Goal: Ability to communicate needs accurately will improve Outcome: Progressing Goal: Ability to interact with others will improve Outcome: Progressing

## 2022-04-09 NOTE — Group Note (Signed)
[facility] LCSW Group Therapy Note   Group Date: 04/09/2022 Start Time: 1300 End Time: 1400   Type of Therapy/Topic:  Group Therapy:  Balance in Life  Participation Level:  Did Not Attend   Description of Group:    This group will address the concept of balance and how it feels and looks when one is unbalanced. Patients will be encouraged to process areas in their lives that are out of balance, and identify reasons for remaining unbalanced. Facilitators will guide patients utilizing problem- solving interventions to address and correct the stressor making their life unbalanced. Understanding and applying boundaries will be explored and addressed for obtaining  and maintaining a balanced life. Patients will be encouraged to explore ways to assertively make their unbalanced needs known to significant others in their lives, using other group members and facilitator for support and feedback.  Therapeutic Goals: Patient will identify two or more emotions or situations they have that consume much of in their lives. Patient will identify signs/triggers that life has become out of balance:  Patient will identify two ways to set boundaries in order to achieve balance in their lives:  Patient will demonstrate ability to communicate their needs through discussion and/or role plays  Summary of Patient Progress: X   Therapeutic Modalities:   Cognitive Behavioral Therapy Solution-Focused Therapy Assertiveness Training   Kasir Hallenbeck R Quinn Quam, LCSW 

## 2022-04-09 NOTE — Progress Notes (Signed)
Pt denies SI/HI/AVH and verbally agrees to approach staff if these become apparent or before harming themselves/others. Rates depression 0/10. Rates anxiety 0/10. Rates pain 0/10. Pt has been in and out of her room throughout the day. Pt seems to be a little irritable but has been cooperative. Pt has been taking medications by PO. Pt has had no issues. Scheduled medications administered to pt, per MD orders. RN provided support and encouragement to pt. Q15 min safety checks implemented and continued. Pt safe on the unit. RN will continue to monitor and intervene as needed.  Problem: Nutrition: Goal: Adequate nutrition will be maintained Outcome: Not Progressing   Problem: Coping: Goal: Will verbalize feelings Outcome: Not Progressing    04/09/22 0837  Psych Admission Type (Psych Patients Only)  Admission Status Involuntary  Psychosocial Assessment  Patient Complaints None  Eye Contact Brief  Facial Expression Flat;Blank;Sad  Affect Flat;Sad  Speech Logical/coherent  Interaction Cautious;Guarded  Motor Activity Slow  Appearance/Hygiene Unremarkable;In scrubs  Behavior Characteristics Cooperative;Appropriate to situation;Calm  Mood Pleasant  Thought Process  Coherency Disorganized  Content Blaming others;Paranoia  Delusions Paranoid  Perception WDL  Hallucination None reported or observed  Judgment Poor  Confusion Mild  Danger to Self  Current suicidal ideation? Denies  Danger to Others  Danger to Others None reported or observed

## 2022-04-09 NOTE — Progress Notes (Signed)
Recreation Therapy Notes  Date: 04/09/2022  Time: 10:40 am   Location: Craft room    Behavioral response: [name]   Intervention Topic: Happiness   Discussion/Intervention: Patient refused to attend group.   Clinical Observations/Feedback:  Patient refused to attend group.   Naethan Bracewell LRT/CTRS         Damien Cisar 04/09/2022 12:52 PM 

## 2022-04-09 NOTE — [facility] Group Notes (Signed)
[facility] Group Notes:  (Nursing/MHT/Case Management/Adjunct)  Date:  04/09/2022  Time:  9:51 AM  Type of Therapy:   Community Meeting  Participation Level:  Active  Participation Quality:  Appropriate, Attentive, and Sharing  Affect:  Appropriate  Cognitive:  Alert and Appropriate  Insight:  Appropriate and Good  Engagement in Group:  Engaged  Modes of Intervention:  Discussion, Education, and Support  Summary of Progress/Problems:  [name] 04/09/2022, 9:51 AM

## 2022-04-09 NOTE — B[facility] Suicide Risk Assessment (Signed)
[facility] Discharge Suicide Risk Assessment   Principal Problem: Schizoaffective disorder, depressive type [facility]) Discharge Diagnoses: Principal Problem:   Schizoaffective disorder, depressive type (HCC)   Total Time spent with patient: 45 minutes  Musculoskeletal: Strength & Muscle Tone: within normal limits Gait & Station: normal Patient leans: [name]  Psychiatric Specialty Exam  Presentation  General Appearance: Disheveled  Eye Contact:Fair  Speech:Pressured  Speech Volume:Increased  Handedness:Right   Mood and Affect  Mood:Irritable; Labile; Angry  Duration of Depression Symptoms: Greater than two weeks  Affect:Blunt; Labile   Thought Process  Thought Processes:Disorganized; Irrevelant  Descriptions of Associations:Loose  Orientation:Partial  Thought Content:Illogical; Rumination; Scattered; Tangential; Delusions  History of Schizophrenia/Schizoaffective disorder:Yes  Duration of Psychotic Symptoms:Greater than six months  Hallucinations:No data recorded Ideas of Reference:Percusatory; Paranoia  Suicidal Thoughts:No data recorded Homicidal Thoughts:No data recorded  Sensorium  Memory:Immediate Fair; Recent Fair; Remote Fair  Judgment:Impaired  Insight:Poor; Lacking   Executive Functions  Concentration:Poor  Attention Span:Poor  Recall:Poor  Fund of Knowledge:Poor  Language:Poor   Psychomotor Activity  Psychomotor Activity:No data recorded  Assets  Assets:Physical Health; Resilience; Housing; Social Support   Sleep  Sleep:No data recorded  Physical Exam: Physical Exam Vitals and nursing note reviewed.  Constitutional:      Appearance: She is ill-appearing.  HENT:     Head: Normocephalic and atraumatic.     Mouth/Throat:     Pharynx: Oropharynx is clear.  Eyes:     Pupils: Pupils are equal, round, and reactive to light.  Cardiovascular:     Rate and Rhythm: Normal rate and regular rhythm.  Pulmonary:     Effort: Pulmonary  effort is normal.     Breath sounds: Normal breath sounds.  Abdominal:     General: Abdomen is flat.     Palpations: Abdomen is soft.  Musculoskeletal:        General: Normal range of motion.  Skin:    General: Skin is warm and dry.  Neurological:     General: No focal deficit present.     Mental Status: She is alert. Mental status is at baseline.  Psychiatric:        Attention and Perception: Attention normal.        Mood and Affect: Mood normal. Affect is blunt and inappropriate.        Speech: Speech is tangential.        Behavior: Behavior is agitated. Behavior is not aggressive.        Thought Content: Thought content is paranoid.        Cognition and Memory: Cognition is impaired.        Judgment: Judgment is inappropriate.    Review of Systems  Constitutional: Negative.   HENT: Negative.    Eyes: Negative.   Respiratory: Negative.    Cardiovascular: Negative.   Gastrointestinal: Negative.   Musculoskeletal: Negative.   Skin: Negative.   Neurological: Negative.   Psychiatric/Behavioral:  The patient is nervous/anxious.    Blood pressure 96/67, pulse 65, temperature 98.2 F (36.8 C), temperature source Oral, resp. rate 18, height 5\' 4"  (1.626 m), weight 40.4 kg, SpO2 99 %. Body mass index is 15.28 kg/m.  Mental Status Per Nursing Assessment::   On Admission:  NA  Demographic Factors:  Unemployed  Loss Factors: Decline in physical health  Historical Factors: Impulsivity  Risk Reduction Factors:   Positive social support  Continued Clinical Symptoms:  Schizophrenia:   Paranoid or undifferentiated type  Cognitive Features That Contribute To Risk:  Thought constriction (  tunnel vision)    Suicide Risk:  Minimal: No identifiable suicidal ideation.  Patients presenting with no risk factors but with morbid ruminations; may be classified as minimal risk based on the severity of the depressive symptoms    Plan Of Care/Follow-up recommendations:  Other:   Continue current medication but add olanzapine.  Involve in individual and group therapy when possible.  Encourage patient to be out of room.  Get collateral information.  Ongoing assessment of dangerousness prior to discharge  Alethia Berthold, MD 04/09/2022, 2:45 PM

## 2022-04-10 DIAGNOSIS — F251 Schizoaffective disorder, depressive type: Secondary | ICD-10-CM | POA: Diagnosis not present

## 2022-04-10 MED ORDER — PALIPERIDONE ER 3 MG PO TB24
6.0000 mg | ORAL_TABLET | Freq: Every day | ORAL | Status: DC
  Administered 2022-04-10 – 2022-04-12 (×2): 6 mg via ORAL
  Filled 2022-04-10 (×2): qty 2

## 2022-04-10 MED ORDER — HALOPERIDOL 1 MG PO TABS: 2.0000 mg | ORAL_TABLET | Freq: Two times a day (BID) | ORAL | Status: DC

## 2022-04-10 NOTE — Group Note (Signed)
[facility] LCSW Group Therapy Note   Group Date: 04/10/2022 Start Time: 1300 End Time: 1400  Type of Therapy and Topic:  Group Therapy:  Feelings around Relapse and Recovery  Participation Level:  None    Description of Group:    Patients in this group will discuss emotions they experience before and after a relapse. They will process how experiencing these feelings, or avoidance of experiencing them, relates to having a relapse. Facilitator will guide patients to explore emotions they have related to recovery. Patients will be encouraged to process which emotions are more powerful. They will be guided to discuss the emotional reaction significant others in their lives may have to patients' relapse or recovery. Patients will be assisted in exploring ways to respond to the emotions of others without this contributing to a relapse.  Therapeutic Goals: Patient will identify two or more emotions that lead to relapse for them:  Patient will identify two emotions that result when they relapse:  Patient will identify two emotions related to recovery:  Patient will demonstrate ability to communicate their needs through discussion and/or role plays.   Summary of Patient Progress: Patient was present for the entirety of the group process. She did not participate in the discussion or the icebreaker.    Therapeutic Modalities:   Cognitive Behavioral Therapy Solution-Focused Therapy Assertiveness Training Relapse Prevention Therapy   Shirl Harris, LCSW

## 2022-04-10 NOTE — Progress Notes (Signed)
Patient much better, clearer.  Gets up without prompts. Eating and sleeping well more visible in the dayroom. She denies si  hi  avh depression and anxiety.  She does still continue to blame others regarding her hospitalization. She is pleasant and cooperative and med compliant.   Cleo Butler-Nicholson, LPN

## 2022-04-10 NOTE — [facility] Suicide Risk Assessment (Signed)
[place] INPATIENT:  Family/Significant Other Suicide Prevention Education  Suicide Prevention Education:  Education Completed; legal guardian, Arwyn Besaw, [phone] has been identified by the patient as the family member/significant other with whom the patient will be residing, and identified as the person(s) who will aid the patient in the event of a mental health crisis (suicidal ideations/suicide attempt).  With written consent from the patient, the family member/significant other has been provided the following suicide prevention education, prior to the and/or following the discharge of the patient.  The suicide prevention education provided includes the following: Suicide risk factors Suicide prevention and interventions National Suicide Hotline telephone number [facility] assessment telephone number [facility] Emergency Assistance [place] and/or Residential Mobile Crisis Unit telephone number  Request made of family/significant other to: Remove weapons (e.g., guns, rifles, knives), all items previously/currently identified as safety concern.   Remove drugs/medications (over-the-counter, prescriptions, illicit drugs), all items previously/currently identified as a safety concern.  The family member/significant other verbalizes understanding of the suicide prevention education information provided.  The family member/significant other agrees to remove the items of safety concern listed above.  Rozann Lesches 04/10/2022, 9:58 AM

## 2022-04-10 NOTE — Progress Notes (Signed)
[facility] MD Progress Note  04/10/2022 11:45 AM [name]  MRN:  [id number] Subjective: Patient seen and chart reviewed.  Also spoke with the patient's sister.  Patient came to treatment team today and was just irritable.  Yelled at everybody about how there was nothing wrong with her, she had no mental health problems, she just wanted to go home and everyone to leave her alone.  Spoke with her sister who described escalating irritability and anger aggression and refusal to eat at home that had endangered both the patient and her mother with whom she lives.  Patient irritable mostly staying to herself but she is eating a little bit here.  Very poor insight. Principal Problem: Schizoaffective disorder, depressive type ([place]) Diagnosis: Principal Problem:   Schizoaffective disorder, depressive type ([place])  Total Time spent with patient: 30 minutes  Past Psychiatric History: Past history of schizoaffective disorder  Past Medical History:  Past Medical History:  Diagnosis Date   Anemia 10/02/2021   Non compliance w medication regimen    Schizophrenia [facility])     Past Surgical History:  Procedure Laterality Date   BIOPSY  09/25/2021   Procedure: BIOPSY;  Surgeon: Milus Banister, MD;  Location: [facility];  Service: Gastroenterology;;   ESOPHAGOGASTRODUODENOSCOPY (EGD) WITH PROPOFOL [name] 09/25/2021   Procedure: ESOPHAGOGASTRODUODENOSCOPY (EGD) WITH PROPOFOL;  Surgeon: Milus Banister, MD;  Location: [facility];  Service: Gastroenterology;  Laterality: [name];  With NGT placement   Family History: History reviewed. No pertinent family history. Family Psychiatric  History: See previous Social History:  Social History   Substance and Sexual Activity  Alcohol Use Not Currently   Comment: Refuses to disclose how much     Social History   Substance and Sexual Activity  Drug Use No   Comment: Refuses to answer    Social History   Socioeconomic History   Marital status: Single    Spouse name:  Not on file   Number of children: Not on file   Years of education: Not on file   Highest education level: Not on file  Occupational History   Not on file  Tobacco Use   Smoking status: Every Day    Packs/day: 2.00    Types: Cigarettes   Smokeless tobacco: Never  Substance and Sexual Activity   Alcohol use: Not Currently    Comment: Refuses to disclose how much   Drug use: No    Comment: Refuses to answer   Sexual activity: Not Currently    Comment: refused to answer  Other Topics Concern   Not on file  Social History Narrative   Not on file   Social Determinants of Health   Financial Resource Strain: Not on file  Food Insecurity: No Food Insecurity (04/08/2022)   Hunger Vital Sign    Worried About Running Out of Food in the Last Year: Never true    Ran Out of Food in the Last Year: Never true  Transportation Needs: No Transportation Needs (04/08/2022)   PRAPARE - Hydrologist (Medical): No    Lack of Transportation (Non-Medical): No  Physical Activity: Not on file  Stress: Not on file  Social Connections: Not on file   Additional Social History:                         Sleep: Fair  Appetite:  Fair  Current Medications: Current Facility-Administered Medications  Medication Dose Route Frequency Provider Last Rate Last  Admin   acetaminophen (TYLENOL) tablet 650 mg  650 mg Oral Q6H PRN Ledarius Leeson T, MD       alum & mag hydroxide-simeth (MAALOX/MYLANTA) 200-200-20 MG/5ML suspension 30 mL  30 mL Oral Q4H PRN Pinkney Venard, [name], MD       feeding supplement (BOOST / RESOURCE BREEZE) liquid 1 Container  1 Container Oral QID Nolton Denis, [name], MD   1 Container at 04/09/22 1136   haloperidol (HALDOL) tablet 5 mg  5 mg Oral BID Teonia Yager, [name], MD   5 mg at 04/10/22 9211   Or   haloperidol lactate (HALDOL) injection 5 mg  5 mg Intramuscular BID Shavonda Wiedman T, MD       LORazepam (ATIVAN) tablet 1 mg  1 mg Oral BID Saray Capasso T, MD   1  mg at 04/10/22 9417   Or   LORazepam (ATIVAN) injection 1 mg  1 mg Intramuscular BID Dashia Caldeira T, MD       magnesium hydroxide (MILK OF MAGNESIA) suspension 30 mL  30 mL Oral Daily PRN Mykaila Blunck T, MD       mirtazapine (REMERON) tablet 15 mg  15 mg Oral QHS Lael Pilch T, MD   15 mg at 04/09/22 2121   multivitamin with minerals tablet 1 tablet  1 tablet Oral Daily Moataz Tavis, [name], MD   1 tablet at 04/10/22 0859   nicotine (NICODERM CQ - dosed in mg/24 hr) patch 7 mg  7 mg Transdermal Daily Karlina Suares T, MD       OLANZapine (ZYPREXA) tablet 15 mg  15 mg Oral QHS Trew Sunde T, MD   15 mg at 04/09/22 2121   thiamine (VITAMIN B1) tablet 100 mg  100 mg Oral Daily Dare Sanger, [name], MD   100 mg at 04/10/22 4081    Lab Results: No results found for this or any previous visit (from the past 48 hour(s)).  Blood Alcohol level:  Lab Results  Component Value Date   ETH <10 03/29/2022   ETH <10 09/12/2021    Metabolic Disorder Labs: Lab Results  Component Value Date   HGBA1C 5.6 10/21/2021   MPG 114 10/21/2021   MPG 111 10/02/2021   Lab Results  Component Value Date   PROLACTIN 13.6 04/16/2015   Lab Results  Component Value Date   CHOL 189 10/21/2021   TRIG 52 10/21/2021   HDL 50 10/21/2021   CHOLHDL 3.8 10/21/2021   VLDL 10 10/21/2021   LDLCALC 129 (H) 10/21/2021   LDLCALC 93 10/01/2020    Physical Findings: AIMS: Facial and Oral Movements Muscles of Facial Expression: None, normal Lips and Perioral Area: None, normal Jaw: None, normal Tongue: None, normal,Extremity Movements Upper (arms, wrists, hands, fingers): None, normal Lower (legs, knees, ankles, toes): None, normal, Trunk Movements Neck, shoulders, hips: None, normal, Overall Severity Severity of abnormal movements (highest score from questions above): None, normal Incapacitation due to abnormal movements: None, normal Patient's awareness of abnormal movements (rate only patient's report): No Awareness,  Dental Status Current problems with teeth and/or dentures?: No Does patient usually wear dentures?: No  CIWA:    COWS:     Musculoskeletal: Strength & Muscle Tone: within normal limits Gait & Station: normal Patient leans: [name]  Psychiatric Specialty Exam:  Presentation  General Appearance: Disheveled  Eye Contact:Fair  Speech:Pressured  Speech Volume:Increased  Handedness:Right   Mood and Affect  Mood:Irritable; Labile; Angry  Affect:Blunt; Labile   Thought Process  Thought Processes:Disorganized; Irrevelant  Descriptions of Associations:Loose  Orientation:Partial  Thought Content:Illogical; Rumination; Scattered; Tangential; Delusions  History of Schizophrenia/Schizoaffective disorder:Yes  Duration of Psychotic Symptoms:Greater than six months  Hallucinations:No data recorded Ideas of Reference:Percusatory; Paranoia  Suicidal Thoughts:No data recorded Homicidal Thoughts:No data recorded  Sensorium  Memory:Immediate Fair; Recent Fair; Remote Fair  Judgment:Impaired  Insight:Poor; Lacking   Executive Functions  Concentration:Poor  Attention Span:Poor  Recall:Poor  Fund of Knowledge:Poor  Language:Poor   Psychomotor Activity  Psychomotor Activity:No data recorded  Assets  Assets:Physical Health; Resilience; Housing; Social Support   Sleep  Sleep:No data recorded   Physical Exam: Physical Exam Vitals reviewed.  Constitutional:      Appearance: Normal appearance.  HENT:     Head: Normocephalic and atraumatic.     Mouth/Throat:     Pharynx: Oropharynx is clear.  Eyes:     Pupils: Pupils are equal, round, and reactive to light.  Cardiovascular:     Rate and Rhythm: Normal rate and regular rhythm.  Pulmonary:     Effort: Pulmonary effort is normal.     Breath sounds: Normal breath sounds.  Abdominal:     General: Abdomen is flat.     Palpations: Abdomen is soft.  Musculoskeletal:        General: Normal range of motion.   Skin:    General: Skin is warm and dry.  Neurological:     General: No focal deficit present.     Mental Status: She is alert. Mental status is at baseline.  Psychiatric:        Attention and Perception: She is inattentive.        Mood and Affect: Mood normal. Affect is angry and inappropriate.        Speech: Speech is tangential.        Behavior: Behavior is agitated.        Thought Content: Thought content is paranoid.        Cognition and Memory: Cognition is impaired.        Judgment: Judgment is inappropriate.    Review of Systems  Constitutional: Negative.   HENT: Negative.    Eyes: Negative.   Respiratory: Negative.    Cardiovascular: Negative.   Gastrointestinal: Negative.   Musculoskeletal: Negative.   Skin: Negative.   Neurological: Negative.   Psychiatric/Behavioral: Negative.  Negative for depression.    Blood pressure 97/63, pulse 71, temperature (!) 97.4 F (36.3 C), temperature source Oral, resp. rate 18, height 5\' 4"  (1.626 m), weight 40.4 kg, SpO2 100 %. Body mass index is 15.28 kg/m.   Treatment Plan Summary: Medication management and Plan medication reviewed.  Currently patient is on olanzapine but probably would do better if we could get her onto a medicine that would be available in a long-acting injectable.  We will consider if we can switch her over to Haldol.  Continue daily assessment encourage patient to come out interact with others.  This may be a difficult stay as she really at this point needs to be living in a assisted living facility or group home but the patient refuses to cooperate with it.  , MD 04/10/2022, 11:45 AM

## 2022-04-10 NOTE — Progress Notes (Signed)
Recreation Therapy Notes  INPATIENT RECREATION THERAPY ASSESSMENT  Patient Details Name: [name] MRN: [id number] DOB: 03/07/1961 Today's Date: 04/10/2022       Information Obtained From: Patient  Able to Participate in Assessment/Interview: Yes  Patient Presentation: Responsive  Reason for Admission (Per Patient): Active Symptoms  Patient Stressors: Family  Coping Skills:   Isolation, Avoidance  Leisure Interests (2+):   (Nothing)  Frequency of Recreation/Participation:    Awareness of Community Resources:  No  Community Resources:     Current Use:    If no, Barriers?:    Expressed Interest in [employer] Information: No  County of Residence:  [place]  Patient Main Form of Transportation: Other (Comment) (Family)  Patient Strengths:  Nothing  Patient Identified Areas of Improvement:  I done all I can do  Patient Goal for Hospitalization:  To go home  Current SI (including self-harm):  No  Current HI:  No  Current AVH: No  Staff Intervention Plan: Group Attendance, Collaborate with Interdisciplinary Treatment Team  Consent to Intern Participation: [name]  Jaise Moser 04/10/2022, 12:36 PM
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
## 2022-04-10 NOTE — Progress Notes (Signed)
Recreation Therapy Notes    Date: 04/10/2022   Time: 10:40 am   Location: Court yard    Behavioral response: [name]   Intervention Topic: Leisure    Discussion/Intervention: Patient refused to attend group.   Clinical Observations/Feedback:  Patient refused to attend group.   Phu Record LRT/CTRS        Aalayah Riles 04/10/2022 11:59 AM

## 2022-04-10 NOTE — Progress Notes (Signed)
Pt denies SI/HI/AVH and verbally agrees to approach staff if these become apparent or before harming themselves/others. Rates depression 0/10. Rates anxiety 0/10. Rates pain 0/10. Pt has been very irritable and agitated. Pt has gone on about wanting to d/c and about her sister and legal papers. Pt stated she did not want to take meds because they were making her walk staggering. Pt said that they also made her too tired. MD made aware. Medications were given and pt cussed Probation officer out about taking the meds but she took them. Pt was looking to the side and responding to [facility]. Pt has been in her room for most of the day. Scheduled medications administered to pt, per MD orders. RN provided support and encouragement to pt. Q15 min safety checks implemented and continued. Pt safe on the unit. RN will continue to monitor and intervene as needed.  Problem: Coping: Goal: Level of anxiety will decrease Outcome: Not Progressing   Problem: Education: Goal: Will be free of psychotic symptoms Outcome: Not Progressing  04/10/22 0859  Psych Admission Type (Psych Patients Only)  Admission Status Involuntary  Psychosocial Assessment  Patient Complaints None  Eye Contact Brief  Facial Expression Angry  Affect Anxious;Angry;Irritable;Preoccupied  Speech Slurred;Soft;Rapid  Interaction Defensive;Demanding;Cautious  Motor Activity Slow  Appearance/Hygiene In scrubs  Behavior Characteristics Cooperative;Agitated;Anxious;Irritable  Mood Irritable;Angry;Anxious  Thought Process  Coherency Disorganized  Content Blaming others;Paranoia  Delusions Paranoid  Perception Hallucinations  Hallucination Auditory  Judgment Poor  Confusion Mild  Danger to Self  Current suicidal ideation? Denies  Danger to Others  Danger to Others None reported or observed

## 2022-04-10 NOTE — BH IP Treatment Plan (Signed)
Interdisciplinary Treatment and Diagnostic Plan Update  04/10/2022 Time of Session: 09:46 [name] MRN: [id number]  Principal Diagnosis: Schizoaffective disorder, depressive type [facility])  Secondary Diagnoses: Principal Problem:   Schizoaffective disorder, depressive type ([place])   Current Medications:  Current Facility-Administered Medications  Medication Dose Route Frequency Provider Last Rate Last Admin   acetaminophen (TYLENOL) tablet 650 mg  650 mg Oral Q6H PRN [name], [name], MD       alum & mag hydroxide-simeth (MAALOX/MYLANTA) 200-200-20 MG/5ML suspension 30 mL  30 mL Oral Q4H PRN [name], [name], MD       feeding supplement (BOOST / RESOURCE BREEZE) liquid 1 Container  1 Container Oral QID [name], [name], MD   1 Container at 04/09/22 1136   haloperidol (HALDOL) tablet 5 mg  5 mg Oral BID [name], [name], MD   5 mg at 04/10/22 8325   Or   haloperidol lactate (HALDOL) injection 5 mg  5 mg Intramuscular BID [name], [name], MD       LORazepam (ATIVAN) tablet 1 mg  1 mg Oral BID [name], [name], MD   1 mg at 04/10/22 4982   Or   LORazepam (ATIVAN) injection 1 mg  1 mg Intramuscular BID [name], [name], MD       magnesium hydroxide (MILK OF MAGNESIA) suspension 30 mL  30 mL Oral Daily PRN [name], [name], MD       mirtazapine (REMERON) tablet 15 mg  15 mg Oral QHS [name], [name], MD   15 mg at 04/09/22 2121   multivitamin with minerals tablet 1 tablet  1 tablet Oral Daily [name], [name], MD   1 tablet at 04/10/22 0859   nicotine (NICODERM CQ - dosed in mg/24 hr) patch 7 mg  7 mg Transdermal Daily [name], [name], MD       OLANZapine (ZYPREXA) tablet 15 mg  15 mg Oral QHS [name], [name], MD   15 mg at 04/09/22 2121   paliperidone (INVEGA) 24 hr tablet 6 mg  6 mg Oral QHS [name], [name], MD       thiamine (VITAMIN B1) tablet 100 mg  100 mg Oral Daily [name], [name], MD   100 mg at 04/10/22 6415   PTA Medications: Medications Prior to Admission  Medication Sig Dispense Refill  Last Dose   clonazePAM (KLONOPIN) 0.5 MG tablet Take 0.5 tablets (0.25 mg total) by mouth 2 (two) times daily. (Patient not taking: Reported on 03/30/2022) 60 tablet 0    haloperidol (HALDOL) 5 MG tablet Take 1 tablet (5 mg total) by mouth 2 (two) times daily. (Patient not taking: Reported on 03/30/2022) 60 tablet 3    midodrine (PROAMATINE) 2.5 MG tablet Take 1 tablet (2.5 mg total) by mouth 3 (three) times daily with meals. (Patient not taking: Reported on 03/30/2022)  0    mirtazapine (REMERON) 15 MG tablet Take 0.5 tablets (7.5 mg total) by mouth at bedtime. (Patient not taking: Reported on 03/30/2022) 30 tablet 2    Multiple Vitamin (MULTIVITAMIN WITH MINERALS) TABS tablet Take 1 tablet by mouth daily. (Patient not taking: Reported on 03/30/2022)      OLANZapine (ZYPREXA) 5 MG tablet Take 1 tablet (5 mg total) by mouth at bedtime. (Patient not taking: Reported on 03/30/2022) 30 tablet 2    thiamine 100 MG tablet Take 1 tablet (100 mg total) by mouth daily. (Patient not taking: Reported on 03/30/2022)       Patient Stressors: Marital or  family conflict    Patient Strengths: Supportive family/friends   Treatment Modalities: Medication Management, Group therapy, Case management,  1 to 1 session with clinician, Psychoeducation, Recreational therapy.   Physician Treatment Plan for Primary Diagnosis: Schizoaffective disorder, depressive type ([place]) Long Term Goal(s): Improvement in symptoms so as ready for discharge   Short Term Goals: Compliance with prescribed medications will improve Ability to verbalize feelings will improve Ability to demonstrate self-control will improve Ability to identify and develop effective coping behaviors will improve  Medication Management: Evaluate patient's response, side effects, and tolerance of medication regimen.  Therapeutic Interventions: 1 to 1 sessions, Unit Group sessions and Medication administration.  Evaluation of Outcomes: Not Met  Physician  Treatment Plan for Secondary Diagnosis: Principal Problem:   Schizoaffective disorder, depressive type ([place])  Long Term Goal(s): Improvement in symptoms so as ready for discharge   Short Term Goals: Compliance with prescribed medications will improve Ability to verbalize feelings will improve Ability to demonstrate self-control will improve Ability to identify and develop effective coping behaviors will improve     Medication Management: Evaluate patient's response, side effects, and tolerance of medication regimen.  Therapeutic Interventions: 1 to 1 sessions, Unit Group sessions and Medication administration.  Evaluation of Outcomes: Not Met   RN Treatment Plan for Primary Diagnosis: Schizoaffective disorder, depressive type ([place]) Long Term Goal(s): Knowledge of disease and therapeutic regimen to maintain health will improve  Short Term Goals: Ability to remain free from injury will improve, Ability to verbalize frustration and anger appropriately will improve, Ability to demonstrate self-control, Ability to participate in decision making will improve, Ability to verbalize feelings will improve, Ability to disclose and discuss suicidal ideas, Ability to identify and develop effective coping behaviors will improve, and Compliance with prescribed medications will improve  Medication Management: RN will administer medications as ordered by provider, will assess and evaluate patient's response and provide education to patient for prescribed medication. RN will report any adverse and/or side effects to prescribing provider.  Therapeutic Interventions: 1 on 1 counseling sessions, Psychoeducation, Medication administration, Evaluate responses to treatment, Monitor vital signs and CBGs as ordered, Perform/monitor CIWA, COWS, AIMS and Fall Risk screenings as ordered, Perform wound care treatments as ordered.  Evaluation of Outcomes: Not Met   LCSW Treatment Plan for Primary Diagnosis:  Schizoaffective disorder, depressive type ([place]) Long Term Goal(s): Safe transition to appropriate next level of care at discharge, Engage patient in therapeutic group addressing interpersonal concerns.  Short Term Goals: Engage patient in aftercare planning with referrals and resources, Increase social support, Increase ability to appropriately verbalize feelings, Increase emotional regulation, Facilitate acceptance of mental health diagnosis and concerns, and Increase skills for wellness and recovery  Therapeutic Interventions: Assess for all discharge needs, 1 to 1 time with Social worker, Explore available resources and support systems, Assess for adequacy in community support network, Educate family and significant other(s) on suicide prevention, Complete Psychosocial Assessment, Interpersonal group therapy.  Evaluation of Outcomes: Not Met   Progress in Treatment: Attending groups: Yes. Participating in groups: No. Taking medication as prescribed: Yes. Toleration medication: Yes. Family/Significant other contact made: Yes, individual(s) contacted:  guardian/sister, [name]. Patient understands diagnosis: No. Discussing patient identified problems/goals with staff: No. Medical problems stabilized or resolved: Yes. Denies suicidal/homicidal ideation: Yes. Issues/concerns per patient self-inventory: No. Other: none.  New problem(s) identified: No, Describe:  none identified.  New Short Term/Long Term Goal(s): elimination of symptoms of psychosis, medication management for mood stabilization; elimination of SI thoughts; development of comprehensive mental  wellness plan.  Patient Goals:  "Nothing I want to work on. I want to be discharged."  Discharge Plan or Barriers: CSW will assist pt with development of an appropriate aftercare/discharge plan.    Reason for Continuation of Hospitalization: Aggression Medication stabilization  Estimated Length of Stay: 1-7  days  Last 3 [place] Suicide Severity Risk Score: Flowsheet Row Admission (Current) from 04/08/2022 in [place] Admission (Discharged) from 10/17/2021 in [place] Admission (Discharged) from 10/02/2021 in [facility]  C-SSRS RISK CATEGORY No Risk No Risk No Risk       Last PHQ 2/9 Scores:     No data to display          Scribe for Treatment Team: Shirl Harris, Marlinda Mike 04/10/2022 2:48 PM

## 2022-04-10 NOTE — Progress Notes (Signed)
Recreation Therapy Notes  INPATIENT RECREATION TR PLAN  Patient Details Name: [name] MRN: [id number] DOB: 1961/07/08 Today's Date: 04/10/2022  Rec Therapy Plan Treatment times per week: at least 3 Estimated Length of Stay: 5-7 days TR Treatment/Interventions: Group participation (Comment)  Discharge Criteria Pt will be discharged from therapy if:: Discharged Treatment plan/goals/alternatives discussed and agreed upon by:: Patient/family  Discharge Summary     Elby Blackwelder 04/10/2022, 12:37 PM

## 2022-04-11 DIAGNOSIS — F251 Schizoaffective disorder, depressive type: Secondary | ICD-10-CM | POA: Diagnosis not present

## 2022-04-11 NOTE — Progress Notes (Signed)
[facility] MD Progress Note  04/11/2022 1:04 PM [name]  MRN:  [id number] Subjective: Follow-up 61 year old woman with schizoaffective disorder.  Patient seen and chart reviewed.  Patient has been getting up and eating which is good.  She has taken her psychiatric medicine which is also good.  On interview today the patient told me there was nothing wrong with her and she was ready to go home.  When I told her that I had spoken with her sister yesterday the patient quickly interrupted me to tell me that she did not have her sister.  When I mention the sister by name patient said that person had been taken to outer space and no longer existed on this planet.  That was pretty much the end of the conversation Principal Problem: Schizoaffective disorder, depressive type (HCC) Diagnosis: Principal Problem:   Schizoaffective disorder, depressive type (HCC)  Total Time spent with patient: 30 minutes  Past Psychiatric History: Past history of schizoaffective disorder  Past Medical History:  Past Medical History:  Diagnosis Date   Anemia 10/02/2021   Non compliance w medication regimen    Schizophrenia [facility])     Past Surgical History:  Procedure Laterality Date   BIOPSY  09/25/2021   Procedure: BIOPSY;  Surgeon: Rachael Fee, MD;  Location: [facility];  Service: Gastroenterology;;   ESOPHAGOGASTRODUODENOSCOPY (EGD) WITH PROPOFOL [name] 09/25/2021   Procedure: ESOPHAGOGASTRODUODENOSCOPY (EGD) WITH PROPOFOL;  Surgeon: Rachael Fee, MD;  Location: [facility];  Service: Gastroenterology;  Laterality: [name];  With NGT placement   Family History: History reviewed. No pertinent family history. Family Psychiatric  History: See previous Social History:  Social History   Substance and Sexual Activity  Alcohol Use Not Currently   Comment: Refuses to disclose how much     Social History   Substance and Sexual Activity  Drug Use No   Comment: Refuses to answer    Social History   Socioeconomic  History   Marital status: Single    Spouse name: Not on file   Number of children: Not on file   Years of education: Not on file   Highest education level: Not on file  Occupational History   Not on file  Tobacco Use   Smoking status: Every Day    Packs/day: 2.00    Types: Cigarettes   Smokeless tobacco: Never  Substance and Sexual Activity   Alcohol use: Not Currently    Comment: Refuses to disclose how much   Drug use: No    Comment: Refuses to answer   Sexual activity: Not Currently    Comment: refused to answer  Other Topics Concern   Not on file  Social History Narrative   Not on file   Social Determinants of Health   Financial Resource Strain: Not on file  Food Insecurity: No Food Insecurity (04/08/2022)   Hunger Vital Sign    Worried About Running Out of Food in the Last Year: Never true    Ran Out of Food in the Last Year: Never true  Transportation Needs: No Transportation Needs (04/08/2022)   PRAPARE - Administrator, Civil Service (Medical): No    Lack of Transportation (Non-Medical): No  Physical Activity: Not on file  Stress: Not on file  Social Connections: Not on file   Additional Social History:                         Sleep: Fair  Appetite:  Fair  Current Medications: Current Facility-Administered Medications  Medication Dose Route Frequency Provider Last Rate Last Admin   acetaminophen (TYLENOL) tablet 650 mg  650 mg Oral Q6H PRN Meridee Branum T, MD       alum & mag hydroxide-simeth (MAALOX/MYLANTA) 200-200-20 MG/5ML suspension 30 mL  30 mL Oral Q4H PRN Sisto Granillo, [name], MD       feeding supplement (BOOST / RESOURCE BREEZE) liquid 1 Container  1 Container Oral QID Marquee Fuchs, [name], MD   1 Container at 04/09/22 1136   haloperidol (HALDOL) tablet 5 mg  5 mg Oral BID Irving Lubbers, [name], MD   5 mg at 04/11/22 0017   Or   haloperidol lactate (HALDOL) injection 5 mg  5 mg Intramuscular BID Francesco Provencal T, MD       LORazepam (ATIVAN)  tablet 1 mg  1 mg Oral BID Leeana Creer T, MD   1 mg at 04/11/22 4944   Or   LORazepam (ATIVAN) injection 1 mg  1 mg Intramuscular BID Maria Gallicchio T, MD       magnesium hydroxide (MILK OF MAGNESIA) suspension 30 mL  30 mL Oral Daily PRN Heinrich Fertig T, MD       mirtazapine (REMERON) tablet 15 mg  15 mg Oral QHS Aloys Hupfer, [name], MD   15 mg at 04/10/22 2111   multivitamin with minerals tablet 1 tablet  1 tablet Oral Daily Ladona Rosten, [name], MD   1 tablet at 04/11/22 0816   nicotine (NICODERM CQ - dosed in mg/24 hr) patch 7 mg  7 mg Transdermal Daily Candela Krul T, MD       OLANZapine (ZYPREXA) tablet 15 mg  15 mg Oral QHS Edinson Domeier T, MD   15 mg at 04/10/22 2111   paliperidone (INVEGA) 24 hr tablet 6 mg  6 mg Oral QHS Cassie Shedlock T, MD   6 mg at 04/10/22 2111   thiamine (VITAMIN B1) tablet 100 mg  100 mg Oral Daily Bryann Gentz, [name], MD   100 mg at 04/11/22 9675    Lab Results: No results found for this or any previous visit (from the past 48 hour(s)).  Blood Alcohol level:  Lab Results  Component Value Date   ETH <10 03/29/2022   ETH <10 09/12/2021    Metabolic Disorder Labs: Lab Results  Component Value Date   HGBA1C 5.6 10/21/2021   MPG 114 10/21/2021   MPG 111 10/02/2021   Lab Results  Component Value Date   PROLACTIN 13.6 04/16/2015   Lab Results  Component Value Date   CHOL 189 10/21/2021   TRIG 52 10/21/2021   HDL 50 10/21/2021   CHOLHDL 3.8 10/21/2021   VLDL 10 10/21/2021   LDLCALC 129 (H) 10/21/2021   LDLCALC 93 10/01/2020    Physical Findings: AIMS: Facial and Oral Movements Muscles of Facial Expression: None, normal Lips and Perioral Area: None, normal Jaw: None, normal Tongue: None, normal,Extremity Movements Upper (arms, wrists, hands, fingers): None, normal Lower (legs, knees, ankles, toes): None, normal, Trunk Movements Neck, shoulders, hips: None, normal, Overall Severity Severity of abnormal movements (highest score from questions above):  None, normal Incapacitation due to abnormal movements: None, normal Patient's awareness of abnormal movements (rate only patient's report): No Awareness, Dental Status Current problems with teeth and/or dentures?: No Does patient usually wear dentures?: No  CIWA:    COWS:     Musculoskeletal: Strength & Muscle Tone: within normal limits Gait & Station: normal Patient leans: [name]  Psychiatric  Specialty Exam:  Presentation  General Appearance: Disheveled  Eye Contact:Fair  Speech:Pressured  Speech Volume:Increased  Handedness:Right   Mood and Affect  Mood:Irritable; Labile; Angry  Affect:Blunt; Labile   Thought Process  Thought Processes:Disorganized; Irrevelant  Descriptions of Associations:Loose  Orientation:Partial  Thought Content:Illogical; Rumination; Scattered; Tangential; Delusions  History of Schizophrenia/Schizoaffective disorder:Yes  Duration of Psychotic Symptoms:Greater than six months  Hallucinations:No data recorded Ideas of Reference:Percusatory; Paranoia  Suicidal Thoughts:No data recorded Homicidal Thoughts:No data recorded  Sensorium  Memory:Immediate Fair; Recent Fair; Remote Fair  Judgment:Impaired  Insight:Poor; Lacking   Executive Functions  Concentration:Poor  Attention Span:Poor  Recall:Poor  Fund of Knowledge:Poor  Language:Poor   Psychomotor Activity  Psychomotor Activity:No data recorded  [place]; Resilience; Housing; Social Support   Sleep  Sleep:No data recorded   Physical Exam: Physical Exam Vitals and nursing note reviewed.  Constitutional:      Appearance: Normal appearance.  HENT:     Head: Normocephalic and atraumatic.     Mouth/Throat:     Pharynx: Oropharynx is clear.  Eyes:     Pupils: Pupils are equal, round, and reactive to light.  Cardiovascular:     Rate and Rhythm: Normal rate and regular rhythm.  Pulmonary:     Effort: Pulmonary effort is normal.     Breath  sounds: Normal breath sounds.  Abdominal:     General: Abdomen is flat.     Palpations: Abdomen is soft.  Musculoskeletal:        General: Normal range of motion.  Skin:    General: Skin is warm and dry.  Neurological:     General: No focal deficit present.     Mental Status: She is alert. Mental status is at baseline.  Psychiatric:        Attention and Perception: She is inattentive.        Mood and Affect: Mood normal. Affect is angry and inappropriate.        Speech: Speech is tangential.        Thought Content: Thought content is delusional.        Cognition and Memory: Cognition is impaired.        Judgment: Judgment is inappropriate.    Review of Systems  Constitutional: Negative.   HENT: Negative.    Eyes: Negative.   Respiratory: Negative.    Cardiovascular: Negative.   Gastrointestinal: Negative.   Musculoskeletal: Negative.   Skin: Negative.   Neurological: Negative.   Psychiatric/Behavioral:  The patient is nervous/anxious.    Blood pressure 97/72, pulse 63, temperature 97.7 F (36.5 C), temperature source Oral, resp. rate 18, height 5\' 4"  (1.626 m), weight 40.4 kg, SpO2 99 %. Body mass index is 15.28 kg/m.   Treatment Plan Summary: Medication management and Plan reviewed MAR sheets that she is taking her antipsychotic.  Trying not to get her to upset to maintain some rapport.  Praise her for eating and coming out of her room at times.  Alethia Berthold, MD 04/11/2022, 1:04 PM

## 2022-04-11 NOTE — Progress Notes (Signed)
Pt presents with depressed mood, affect irritable. [name] states she is fine, answers most questions in abrupt, short answers and forwards very little. She is eating well and denies any acute concerns or pain, stating '' I just want to know when I can go home. I'm ready to go home. ''  Patient declines ensure and nicotine patch, but med compliant with all other meds. She did complete self inventory form and rates her depression at 0/10 on scale, 10 being worst 0 being none. She rates hopelessness and anxiety at same scale, 0 /10 on scale, with 10 being worst 0 being none.  She reports sleeping well, and marks concentration poor.  Pt is safe, denies any SI or HI or A/V Hallucinations. Pt remains guarded, isolative but is safe and able to make needs known. Will con't to monitor.

## 2022-04-11 NOTE — [facility] Group Notes (Signed)
[place] Group Notes:  (Nursing/MHT/Case Management/Adjunct)  Date:  04/11/2022  Time:  3:05 PM  Type of Therapy:  Group Therapy  Participation Level:  None  Participation Quality:  Inattentive and Resistant  Affect:  Depressed and Flat  Cognitive:  Lacking  Insight:  Limited  Engagement in Group:  Lacking, Limited, and None  Modes of Intervention:  Discussion  Summary of Progress/Problems: Pt attended group but did not engage at all. Did not participate with craft or group discussion.   Maglione,Barnell Shieh E 04/11/2022, 3:05 PM

## 2022-04-11 NOTE — [facility] Group Notes (Signed)
Patients were given education on positive reframing and how positive mindset can impact mood. Patients were given a poem to read as well and discussed how anxiety can impact thinking. Pt attended but did not engage.

## 2022-04-12 DIAGNOSIS — F251 Schizoaffective disorder, depressive type: Secondary | ICD-10-CM | POA: Diagnosis not present

## 2022-04-12 NOTE — Plan of Care (Signed)
Met with Pt in medication room. Pt is pleasant, and appropriate. Without complaint of injury or distress. Pt refuses Boost drink and nicotine patch. Pt denies SI / HI / AVH. Pt refuses reassessment of vital signs are this time, will continue to make attempts to obtain update set of vitals. Denies any changes in vision, or fatigue. Staff will continue to monitor.    Problem: Education: Goal: Knowledge of General Education information will improve Description: Including pain rating scale, medication(s)/side effects and non-pharmacologic comfort measures Outcome: Progressing   Problem: Health Behavior/Discharge Planning: Goal: Ability to manage health-related needs will improve Outcome: Progressing

## 2022-04-12 NOTE — Progress Notes (Signed)
[facility] MD Progress Note  04/12/2022 11:40 AM [name]  MRN:  [id number] Subjective: Patient seen and chart reviewed.  This is a 61 year old woman with schizophrenia.  Patient was initially pleasant but when I told her I had spoken with her sister yesterday she again became paranoid and psychotic and started talking about how her sister would lived in outer space and was not involved in her life.  Did not want to discuss any of the urgent situations that are impeding her discharge.  Plan for now is to continue antipsychotic Principal Problem: Schizoaffective disorder, depressive type (HCC) Diagnosis: Principal Problem:   Schizoaffective disorder, depressive type (HCC)  Total Time spent with patient: 30 minutes  Past Psychiatric History: Past history of schizophrenia  Past Medical History:  Past Medical History:  Diagnosis Date   Anemia 10/02/2021   Non compliance w medication regimen    Schizophrenia [facility])     Past Surgical History:  Procedure Laterality Date   BIOPSY  09/25/2021   Procedure: BIOPSY;  Surgeon: Rachael Fee, MD;  Location: [facility];  Service: Gastroenterology;;   ESOPHAGOGASTRODUODENOSCOPY (EGD) WITH PROPOFOL [name] 09/25/2021   Procedure: ESOPHAGOGASTRODUODENOSCOPY (EGD) WITH PROPOFOL;  Surgeon: Rachael Fee, MD;  Location: [facility];  Service: Gastroenterology;  Laterality: [name];  With NGT placement   Family History: History reviewed. No pertinent family history. Family Psychiatric  History: See previous Social History:  Social History   Substance and Sexual Activity  Alcohol Use Not Currently   Comment: Refuses to disclose how much     Social History   Substance and Sexual Activity  Drug Use No   Comment: Refuses to answer    Social History   Socioeconomic History   Marital status: Single    Spouse name: Not on file   Number of children: Not on file   Years of education: Not on file   Highest education level: Not on file  Occupational History    Not on file  Tobacco Use   Smoking status: Every Day    Packs/day: 2.00    Types: Cigarettes   Smokeless tobacco: Never  Substance and Sexual Activity   Alcohol use: Not Currently    Comment: Refuses to disclose how much   Drug use: No    Comment: Refuses to answer   Sexual activity: Not Currently    Comment: refused to answer  Other Topics Concern   Not on file  Social History Narrative   Not on file   Social Determinants of Health   Financial Resource Strain: Not on file  Food Insecurity: No Food Insecurity (04/08/2022)   Hunger Vital Sign    Worried About Running Out of Food in the Last Year: Never true    Ran Out of Food in the Last Year: Never true  Transportation Needs: No Transportation Needs (04/08/2022)   PRAPARE - Administrator, Civil Service (Medical): No    Lack of Transportation (Non-Medical): No  Physical Activity: Not on file  Stress: Not on file  Social Connections: Not on file   Additional Social History:                         Sleep: Fair  Appetite:  Fair  Current Medications: Current Facility-Administered Medications  Medication Dose Route Frequency Provider Last Rate Last Admin   acetaminophen (TYLENOL) tablet 650 mg  650 mg Oral Q6H PRN Maebelle Sulton, [name], MD       alum &  mag hydroxide-simeth (MAALOX/MYLANTA) 200-200-20 MG/5ML suspension 30 mL  30 mL Oral Q4H PRN Rawleigh Rode, [name], MD       feeding supplement (BOOST / RESOURCE BREEZE) liquid 1 Container  1 Container Oral QID [name], [name], MD   1 Container at 04/11/22 1625   haloperidol (HALDOL) tablet 5 mg  5 mg Oral BID Tillmon Kisling, [name], MD   5 mg at 04/12/22 1610   Or   haloperidol lactate (HALDOL) injection 5 mg  5 mg Intramuscular BID Nkosi Cortright T, MD       LORazepam (ATIVAN) tablet 1 mg  1 mg Oral BID Asta Corbridge T, MD   1 mg at 04/12/22 9604   Or   LORazepam (ATIVAN) injection 1 mg  1 mg Intramuscular BID Ivorie Uplinger T, MD       magnesium hydroxide (MILK OF  MAGNESIA) suspension 30 mL  30 mL Oral Daily PRN Tuere Nwosu T, MD       mirtazapine (REMERON) tablet 15 mg  15 mg Oral QHS Honi Name, [name], MD   15 mg at 04/10/22 2111   multivitamin with minerals tablet 1 tablet  1 tablet Oral Daily Leray Garverick, [name], MD   1 tablet at 04/12/22 5409   nicotine (NICODERM CQ - dosed in mg/24 hr) patch 7 mg  7 mg Transdermal Daily Anouk Critzer T, MD       OLANZapine (ZYPREXA) tablet 15 mg  15 mg Oral QHS Jaxzen Vanhorn T, MD   15 mg at 04/10/22 2111   paliperidone (INVEGA) 24 hr tablet 6 mg  6 mg Oral QHS Heman Que T, MD   6 mg at 04/10/22 2111   thiamine (VITAMIN B1) tablet 100 mg  100 mg Oral Daily Dailey Alberson, [name], MD   100 mg at 04/12/22 8119    Lab Results: No results found for this or any previous visit (from the past 48 hour(s)).  Blood Alcohol level:  Lab Results  Component Value Date   ETH <10 03/29/2022   ETH <10 14/78/2956    Metabolic Disorder Labs: Lab Results  Component Value Date   HGBA1C 5.6 10/21/2021   MPG 114 10/21/2021   MPG 111 10/02/2021   Lab Results  Component Value Date   PROLACTIN 13.6 04/16/2015   Lab Results  Component Value Date   CHOL 189 10/21/2021   TRIG 52 10/21/2021   HDL 50 10/21/2021   CHOLHDL 3.8 10/21/2021   VLDL 10 10/21/2021   LDLCALC 129 (H) 10/21/2021   LDLCALC 93 10/01/2020    Physical Findings: AIMS: Facial and Oral Movements Muscles of Facial Expression: None, normal Lips and Perioral Area: None, normal Jaw: None, normal Tongue: None, normal,Extremity Movements Upper (arms, wrists, hands, fingers): None, normal Lower (legs, knees, ankles, toes): None, normal, Trunk Movements Neck, shoulders, hips: None, normal, Overall Severity Severity of abnormal movements (highest score from questions above): None, normal Incapacitation due to abnormal movements: None, normal Patient's awareness of abnormal movements (rate only patient's report): No Awareness, Dental Status Current problems with  teeth and/or dentures?: No Does patient usually wear dentures?: No  CIWA:    COWS:     Musculoskeletal: Strength & Muscle Tone: within normal limits Gait & Station: normal Patient leans: [name]  Psychiatric Specialty Exam:  Presentation  General Appearance: Disheveled  Eye Contact:Fair  Speech:Pressured  Speech Volume:Increased  Handedness:Right   Mood and Affect  Mood:Irritable; Labile; Angry  Affect:Blunt; Labile   Thought Process  Thought Processes:Disorganized; Irrevelant  Descriptions  of Associations:Loose  Orientation:Partial  Thought Content:Illogical; Rumination; Scattered; Tangential; Delusions  History of Schizophrenia/Schizoaffective disorder:Yes  Duration of Psychotic Symptoms:Greater than six months  Hallucinations:No data recorded Ideas of Reference:Percusatory; Paranoia  Suicidal Thoughts:No data recorded Homicidal Thoughts:No data recorded  Sensorium  Memory:Immediate Fair; Recent Fair; Remote Fair  Judgment:Impaired  Insight:Poor; Lacking   Executive Functions  Concentration:Poor  Attention Span:Poor  Recall:Poor  Fund of Knowledge:Poor  Language:Poor   Psychomotor Activity  Psychomotor Activity:No data recorded  Assets  Assets:Physical Health; Resilience; Housing; Social Support   Sleep  Sleep:No data recorded   Physical Exam: Physical Exam Vitals and nursing note reviewed.  Constitutional:      Appearance: Normal appearance.  HENT:     Head: Normocephalic and atraumatic.     Mouth/Throat:     Pharynx: Oropharynx is clear.  Eyes:     Pupils: Pupils are equal, round, and reactive to light.  Cardiovascular:     Rate and Rhythm: Normal rate and regular rhythm.  Pulmonary:     Effort: Pulmonary effort is normal.     Breath sounds: Normal breath sounds.  Abdominal:     General: Abdomen is flat.     Palpations: Abdomen is soft.  Musculoskeletal:        General: Normal range of motion.  Skin:    General:  Skin is warm and dry.  Neurological:     General: No focal deficit present.     Mental Status: She is alert. Mental status is at baseline.  Psychiatric:        Mood and Affect: Mood normal. Affect is blunt.        Speech: Speech normal.        Behavior: Behavior is cooperative.        Thought Content: Thought content is paranoid and delusional.        Cognition and Memory: Cognition is impaired.    Review of Systems  Constitutional: Negative.   HENT: Negative.    Eyes: Negative.   Respiratory: Negative.    Cardiovascular: Negative.   Gastrointestinal: Negative.   Musculoskeletal: Negative.   Skin: Negative.   Neurological: Negative.   Psychiatric/Behavioral: Negative.     Blood pressure (!) 80/56, pulse (!) 57, temperature 98.4 F (36.9 C), temperature source Oral, resp. rate 18, height 5\' 4"  (1.626 m), weight 40.4 kg, SpO2 98 %. Body mass index is 15.28 kg/m.   Treatment Plan Summary: Plan continue antipsychotic.  Possibly increase dose as tolerated.  Work towards long-acting injectable but also need to work just towards improving the paranoia so that we can address discharge planning  , MD 04/12/2022, 11:40 AM

## 2022-04-12 NOTE — Group Note (Signed)
[facility] LCSW Group Therapy Note   Group Date: 04/12/2022 Start Time: 1230 End Time: 1330  Type of Therapy and Topic:  Group Therapy:  Feelings around Relapse and Recovery  Participation Level:  Did Not Attend   Mood:  Description of Group:    Patients in this group will discuss emotions they experience before and after a relapse. They will process how experiencing these feelings, or avoidance of experiencing them, relates to having a relapse. Facilitator will guide patients to explore emotions they have related to recovery. Patients will be encouraged to process which emotions are more powerful. They will be guided to discuss the emotional reaction significant others in their lives may have to patients' relapse or recovery. Patients will be assisted in exploring ways to respond to the emotions of others without this contributing to a relapse.  Therapeutic Goals: Patient will identify two or more emotions that lead to relapse for them:  Patient will identify two emotions that result when they relapse:  Patient will identify two emotions related to recovery:  Patient will demonstrate ability to communicate their needs through discussion and/or role plays.   Summary of Patient Progress:  Patient did not attend group despite encouraged participation.    Therapeutic Modalities:   Cognitive Behavioral Therapy Solution-Focused Therapy Assertiveness Training Relapse Prevention Therapy   Justyn Boyson W Nohelani Benning, LCSWA 

## 2022-04-12 NOTE — [facility] Group Notes (Signed)
[place] Group Notes:  (Nursing/MHT/Case Management/Adjunct)  Date:  04/12/2022  Time:  5:20 PM  Type of Therapy:  Group Therapy  Participation Level:  Minimal  Participation Quality:  Attentive and Resistant  Affect:  Flat  Cognitive:  Alert and Lacking  Insight:  Lacking and Limited  Engagement in Group:  Lacking, Limited, and None  Modes of Intervention:  Discussion and Education  Summary of Progress/Problems:  Maglione,Elmon Shader E 04/12/2022, 5:20 PM

## 2022-04-13 DIAGNOSIS — F251 Schizoaffective disorder, depressive type: Secondary | ICD-10-CM | POA: Diagnosis not present

## 2022-04-13 MED ORDER — PALIPERIDONE ER 3 MG PO TB24
9.0000 mg | ORAL_TABLET | Freq: Every day | ORAL | Status: DC
  Administered 2022-04-13 – 2022-04-21 (×8): 9 mg via ORAL
  Filled 2022-04-13 (×9): qty 3

## 2022-04-13 NOTE — Plan of Care (Signed)
  Problem: Education: Goal: Knowledge of General Education information will improve Description Including pain rating scale, medication(s)/side effects and non-pharmacologic comfort measures Outcome: Progressing   

## 2022-04-13 NOTE — Progress Notes (Signed)
Recreation Therapy Notes  Date: 04/13/2022   Time: 10:00 am    Location: Craft room      Behavioral response: Appropriate, Not engaged    Intervention Topic: Relaxation       Discussion/Intervention:  Group content today was focused on relaxation. The group defined relaxation and identified healthy ways to relax. Individuals expressed how much time they spend relaxing. Patients expressed how much their life would be if they did not make time for themselves to relax. The group stated ways they could improve their relaxation techniques in the future.  Individuals participated in the intervention "Time to Relax" where they had a chance to experience different relaxation techniques.  Clinical Observations/Feedback: Patient came to group but was not engaged in the group topic or intervention.   Johntay Doolen LRT/CTRS             Bitha Fauteux 04/13/2022 11:38 AM

## 2022-04-13 NOTE — Progress Notes (Signed)
Has been isolative to her room since the start of the shift. In her room with the lights off.  She declined the opportunity to get snack, but did drink a Gatorade when offered.  She denies si  hi  avh depression and anxiety.  Although she denies all she presents flat and depressed at this encounter.  Will continue to offer support and encouragement.  Q15 minute safety checks in place.       C Butler-Nicholson, LPN

## 2022-04-13 NOTE — [facility] Group Notes (Signed)
[facility] Group Notes:  (Nursing/MHT/Case Management/Adjunct)  Date:  04/13/2022  Time:  9:46 AM  Type of Therapy:   community meeting  Participation Level:  None  Participation Quality:  Drowsy  Affect:  Flat  Cognitive:  Lacking  Insight:  None  Engagement in Group:  None  Modes of Intervention:  Discussion and Education  Summary of Progress/Problems:  Antonieta Pert 04/13/2022, 9:46 AM

## 2022-04-13 NOTE — Progress Notes (Signed)
[facility] MD Progress Note  04/13/2022 11:49 AM [name]  MRN:  [id number] Subjective: Patient seen and chart reviewed.  Patient is pleasant enough as long as you do not approach her with concerns about her family or her discharge.  As soon as I started to bring this up she got agitated pacing back and forth in her room insisting once again that her family does not exist talking about how she needs some kind of healing something or other that she would not explain to me.  Otherwise though she comes out and eats okay does not cause any harm. Principal Problem: Schizoaffective disorder, depressive type (HCC) Diagnosis: Principal Problem:   Schizoaffective disorder, depressive type (HCC)  Total Time spent with patient: 30 minutes  Past Psychiatric History: Past history of schizophrenia  Past Medical History:  Past Medical History:  Diagnosis Date   Anemia 10/02/2021   Non compliance w medication regimen    Schizophrenia [facility])     Past Surgical History:  Procedure Laterality Date   BIOPSY  09/25/2021   Procedure: BIOPSY;  Surgeon: Rachael Fee, MD;  Location: [facility];  Service: Gastroenterology;;   ESOPHAGOGASTRODUODENOSCOPY (EGD) WITH PROPOFOL [name] 09/25/2021   Procedure: ESOPHAGOGASTRODUODENOSCOPY (EGD) WITH PROPOFOL;  Surgeon: Rachael Fee, MD;  Location: [facility];  Service: Gastroenterology;  Laterality: [name];  With NGT placement   Family History: History reviewed. No pertinent family history. Family Psychiatric  History: See previous Social History:  Social History   Substance and Sexual Activity  Alcohol Use Not Currently   Comment: Refuses to disclose how much     Social History   Substance and Sexual Activity  Drug Use No   Comment: Refuses to answer    Social History   Socioeconomic History   Marital status: Single    Spouse name: Not on file   Number of children: Not on file   Years of education: Not on file   Highest education level: Not on file   Occupational History   Not on file  Tobacco Use   Smoking status: Every Day    Packs/day: 2.00    Types: Cigarettes   Smokeless tobacco: Never  Substance and Sexual Activity   Alcohol use: Not Currently    Comment: Refuses to disclose how much   Drug use: No    Comment: Refuses to answer   Sexual activity: Not Currently    Comment: refused to answer  Other Topics Concern   Not on file  Social History Narrative   Not on file   Social Determinants of Health   Financial Resource Strain: Not on file  Food Insecurity: No Food Insecurity (04/08/2022)   Hunger Vital Sign    Worried About Running Out of Food in the Last Year: Never true    Ran Out of Food in the Last Year: Never true  Transportation Needs: No Transportation Needs (04/08/2022)   PRAPARE - Administrator, Civil Service (Medical): No    Lack of Transportation (Non-Medical): No  Physical Activity: Not on file  Stress: Not on file  Social Connections: Not on file   Additional Social History:                         Sleep: Fair  Appetite:  Fair  Current Medications: Current Facility-Administered Medications  Medication Dose Route Frequency Provider Last Rate Last Admin   acetaminophen (TYLENOL) tablet 650 mg  650 mg Oral Q6H PRN Zadie Deemer  T, MD       alum & mag hydroxide-simeth (MAALOX/MYLANTA) 200-200-20 MG/5ML suspension 30 mL  30 mL Oral Q4H PRN Deletha Jaffee, [name], MD       feeding supplement (BOOST / RESOURCE BREEZE) liquid 1 Container  1 Container Oral QID Si Jachim, [name], MD   1 Container at 04/12/22 1647   haloperidol (HALDOL) tablet 5 mg  5 mg Oral BID Cathlene Gardella T, MD   5 mg at 04/13/22 0741   Or   haloperidol lactate (HALDOL) injection 5 mg  5 mg Intramuscular BID Skyler Dusing T, MD       LORazepam (ATIVAN) tablet 1 mg  1 mg Oral BID Rayon Mcchristian T, MD   1 mg at 04/13/22 0017   Or   LORazepam (ATIVAN) injection 1 mg  1 mg Intramuscular BID Cadan Maggart T, MD       magnesium  hydroxide (MILK OF MAGNESIA) suspension 30 mL  30 mL Oral Daily PRN Su Duma T, MD       mirtazapine (REMERON) tablet 15 mg  15 mg Oral QHS Kirstein Baxley T, MD   15 mg at 04/12/22 2115   multivitamin with minerals tablet 1 tablet  1 tablet Oral Daily Tiffiany Beadles, [name], MD   1 tablet at 04/13/22 0741   nicotine (NICODERM CQ - dosed in mg/24 hr) patch 7 mg  7 mg Transdermal Daily Leeona Mccardle T, MD       OLANZapine (ZYPREXA) tablet 15 mg  15 mg Oral QHS Wyat Infinger T, MD   15 mg at 04/12/22 2115   paliperidone (INVEGA) 24 hr tablet 9 mg  9 mg Oral QHS Glorimar Stroope T, MD       thiamine (VITAMIN B1) tablet 100 mg  100 mg Oral Daily Shardae Kleinman, [name], MD   100 mg at 04/13/22 0741    Lab Results: No results found for this or any previous visit (from the past 48 hour(s)).  Blood Alcohol level:  Lab Results  Component Value Date   ETH <10 03/29/2022   ETH <10 49/44/9675    Metabolic Disorder Labs: Lab Results  Component Value Date   HGBA1C 5.6 10/21/2021   MPG 114 10/21/2021   MPG 111 10/02/2021   Lab Results  Component Value Date   PROLACTIN 13.6 04/16/2015   Lab Results  Component Value Date   CHOL 189 10/21/2021   TRIG 52 10/21/2021   HDL 50 10/21/2021   CHOLHDL 3.8 10/21/2021   VLDL 10 10/21/2021   LDLCALC 129 (H) 10/21/2021   LDLCALC 93 10/01/2020    Physical Findings: AIMS: Facial and Oral Movements Muscles of Facial Expression: None, normal Lips and Perioral Area: None, normal Jaw: None, normal Tongue: None, normal,Extremity Movements Upper (arms, wrists, hands, fingers): None, normal Lower (legs, knees, ankles, toes): None, normal, Trunk Movements Neck, shoulders, hips: None, normal, Overall Severity Severity of abnormal movements (highest score from questions above): None, normal Incapacitation due to abnormal movements: None, normal Patient's awareness of abnormal movements (rate only patient's report): No Awareness, Dental Status Current problems with teeth  and/or dentures?: No Does patient usually wear dentures?: No  CIWA:    COWS:     Musculoskeletal: Strength & Muscle Tone: within normal limits Gait & Station: normal Patient leans: [name]  Psychiatric Specialty Exam:  Presentation  General Appearance: Disheveled  Eye Contact:Fair  Speech:Pressured  Speech Volume:Increased  Handedness:Right   Mood and Affect  Mood:Irritable; Labile; Angry  Affect:Blunt; Labile   Thought  Process  Thought Processes:Disorganized; Irrevelant  Descriptions of Associations:Loose  Orientation:Partial  Thought Content:Illogical; Rumination; Scattered; Tangential; Delusions  History of Schizophrenia/Schizoaffective disorder:Yes  Duration of Psychotic Symptoms:Greater than six months  Hallucinations:No data recorded Ideas of Reference:Percusatory; Paranoia  Suicidal Thoughts:No data recorded Homicidal Thoughts:No data recorded  Sensorium  Memory:Immediate Fair; Recent Fair; Remote Fair  Judgment:Impaired  Insight:Poor; Lacking   Executive Functions  Concentration:Poor  Attention Span:Poor  Recall:Poor  Fund of Knowledge:Poor  Language:Poor   Psychomotor Activity  Psychomotor Activity:No data recorded  Assets  Assets:Physical Health; Resilience; Housing; Social Support   Sleep  Sleep:No data recorded   Physical Exam: Physical Exam Vitals and nursing note reviewed.  Constitutional:      Appearance: Normal appearance.  HENT:     Head: Normocephalic and atraumatic.     Mouth/Throat:     Pharynx: Oropharynx is clear.  Eyes:     Pupils: Pupils are equal, round, and reactive to light.  Cardiovascular:     Rate and Rhythm: Normal rate and regular rhythm.  Pulmonary:     Effort: Pulmonary effort is normal.     Breath sounds: Normal breath sounds.  Abdominal:     General: Abdomen is flat.     Palpations: Abdomen is soft.  Musculoskeletal:        General: Normal range of motion.  Skin:    General: Skin is  warm and dry.  Neurological:     General: No focal deficit present.     Mental Status: She is alert. Mental status is at baseline.  Psychiatric:        Attention and Perception: She is inattentive.        Mood and Affect: Mood normal. Affect is inappropriate.        Speech: Speech is tangential.        Behavior: Behavior is agitated.        Thought Content: Thought content normal.        Judgment: Judgment is inappropriate.    Review of Systems  Constitutional: Negative.   HENT: Negative.    Eyes: Negative.   Respiratory: Negative.    Cardiovascular: Negative.   Gastrointestinal: Negative.   Musculoskeletal: Negative.   Skin: Negative.   Neurological: Negative.   Psychiatric/Behavioral:  The patient is nervous/anxious.    Blood pressure 92/62, pulse 61, temperature 97.9 F (36.6 C), temperature source Oral, resp. rate 18, height 5\' 4"  (1.626 m), weight 40.4 kg, SpO2 100 %. Body mass index is 15.28 kg/m.   Treatment Plan Summary: Medication management and Plan increase Invega to 9 mg at night.  Also taking the Zyprexa.  Honestly I do not know if she is going to get well enough that her family will be satisfied as she is going to continue to be delusional at this point but we will keep assessing it daily while encouraging her to eat.  , MD 04/13/2022, 11:49 AM

## 2022-04-13 NOTE — Progress Notes (Signed)
Pt denies SI/HI/AVH and verbally agrees to approach staff if these become apparent or before harming themselves/others. Rates depression 0/10. Rates anxiety 0/10. Rates pain 0/10. Pt is still delusional making statements like her family is in space and goes on about her sister not being alive and having nothing to do with her. Pt has gone to two groups and goes to meals but otherwise has been in her room for most of the day. Pt was not ery irritable today. Scheduled medications administered to pt, per MD orders. RN provided support and encouragement to pt. Q15 min safety checks implemented and continued. Pt safe on the unit. RN will continue to monitor and intervene as needed.  Problem: Role Relationship: Goal: Ability to interact with others will improve Outcome: Not Progressing   Problem: Self-Concept: Goal: Will verbalize positive feelings about self Outcome: Not Progressing    04/13/22 0741  Psych Admission Type (Psych Patients Only)  Admission Status Involuntary  Psychosocial Assessment  Patient Complaints None  Eye Contact Fair  Facial Expression Flat;Sad  Affect Sad  Speech Soft;Logical/coherent  Interaction Forwards little;Guarded;Cautious  Motor Activity Slow  Appearance/Hygiene In scrubs;Unremarkable  Behavior Characteristics Cooperative;Guarded  Mood Sad  Thought Process  Coherency Disorganized  Content Blaming others;Delusions  Delusions Paranoid  Perception Hallucinations  Hallucination Auditory  Judgment Poor  Confusion Mild  Danger to Self  Current suicidal ideation? Denies  Danger to Others  Danger to Others None reported or observed

## 2022-04-13 NOTE — Group Note (Signed)
[facility] LCSW Group Therapy Note    Group Date: 04/13/2022 Start Time: 1300 End Time: 1400  Type of Therapy and Topic:  Group Therapy:  Overcoming Obstacles  Participation Level:  [facility] PARTICIPATION LEVEL: Did Not Attend   Description of Group:   In this group patients will be encouraged to explore what they see as obstacles to their own wellness and recovery. They will be guided to discuss their thoughts, feelings, and behaviors related to these obstacles. The group will process together ways to cope with barriers, with attention given to specific choices patients can make. Each patient will be challenged to identify changes they are motivated to make in order to overcome their obstacles. This group will be process-oriented, with patients participating in exploration of their own experiences as well as giving and receiving support and challenge from other group members.  Therapeutic Goals: 1. Patient will identify personal and current obstacles as they relate to admission. 2. Patient will identify barriers that currently interfere with their wellness or overcoming obstacles.  3. Patient will identify feelings, thought process and behaviors related to these barriers. 4. Patient will identify two changes they are willing to make to overcome these obstacles:    Summary of Patient Progress Group not held due to a complex discharge which spanned the entire day.   Therapeutic Modalities:   Cognitive Behavioral Therapy Solution Focused Therapy Motivational Interviewing Relapse Prevention Therapy   Laria Grimmett R Nicholis [place], LCSW 

## 2022-04-14 DIAGNOSIS — F251 Schizoaffective disorder, depressive type: Secondary | ICD-10-CM | POA: Diagnosis not present

## 2022-04-14 NOTE — Progress Notes (Signed)
   04/14/22 0600  Psych Admission Type (Psych Patients Only)  Admission Status Involuntary  Psychosocial Assessment  Patient Complaints None  Eye Contact Fair  Facial Expression Flat;Sad  Affect Sad  Speech Soft;Logical/coherent  Interaction Forwards little;Guarded;Cautious  Motor Activity Slow  Appearance/Hygiene In scrubs;Unremarkable  Behavior Characteristics Cooperative;Guarded  Mood Sad  Thought Process  Coherency Disorganized  Content Blaming others;Delusions  Delusions Paranoid  Perception Hallucinations  Hallucination Auditory  Judgment Poor  Confusion Mild  Danger to Self  Current suicidal ideation? Denies  Danger to Others  Danger to Others None reported or observed

## 2022-04-14 NOTE — Progress Notes (Signed)
[facility] MD Progress Note  04/14/2022 3:25 PM [name]  MRN:  [id number] Subjective: Follow-up 61 year old woman with schizoaffective disorder.  Patient seen.  Patient continues to be delusional and paranoid insisting that her family does not exist demanding discharge showing little insight.  Eating but not interacting with others much. Principal Problem: Schizoaffective disorder, depressive type (HCC) Diagnosis: Principal Problem:   Schizoaffective disorder, depressive type (HCC)  Total Time spent with patient: 30 minutes  Past Psychiatric History: Past history of schizophrenia  Past Medical History:  Past Medical History:  Diagnosis Date   Anemia 10/02/2021   Non compliance w medication regimen    [facility]     Past Surgical History:  Procedure Laterality Date   BIOPSY  09/25/2021   Procedure: BIOPSY;  Surgeon: Rachael Fee, MD;  Location: [facility];  Service: Gastroenterology;;   ESOPHAGOGASTRODUODENOSCOPY (EGD) WITH PROPOFOL [name] 09/25/2021   Procedure: ESOPHAGOGASTRODUODENOSCOPY (EGD) WITH PROPOFOL;  Surgeon: Rachael Fee, MD;  Location: [facility];  Service: Gastroenterology;  Laterality: [name];  With NGT placement   Family History: History reviewed. No pertinent family history. Family Psychiatric  History: See previous Social History:  Social History   Substance and Sexual Activity  Alcohol Use Not Currently   Comment: Refuses to disclose how much     Social History   Substance and Sexual Activity  Drug Use No   Comment: Refuses to answer    Social History   Socioeconomic History   Marital status: Single    Spouse name: Not on file   Number of children: Not on file   Years of education: Not on file   Highest education level: Not on file  Occupational History   Not on file  Tobacco Use   Smoking status: Every Day    Packs/day: 2.00    Types: Cigarettes   Smokeless tobacco: Never  Substance and Sexual Activity   Alcohol use: Not Currently     Comment: Refuses to disclose how much   Drug use: No    Comment: Refuses to answer   Sexual activity: Not Currently    Comment: refused to answer  Other Topics Concern   Not on file  Social History Narrative   Not on file   Social Determinants of Health   Financial Resource Strain: Not on file  Food Insecurity: No Food Insecurity (04/08/2022)   Hunger Vital Sign    Worried About Running Out of Food in the Last Year: Never true    Ran Out of Food in the Last Year: Never true  Transportation Needs: No Transportation Needs (04/08/2022)   PRAPARE - Administrator, Civil Service (Medical): No    Lack of Transportation (Non-Medical): No  Physical Activity: Not on file  Stress: Not on file  Social Connections: Not on file   Additional Social History:                         Sleep: Fair  Appetite:  Fair  Current Medications: Current Facility-Administered Medications  Medication Dose Route Frequency Provider Last Rate Last Admin   acetaminophen (TYLENOL) tablet 650 mg  650 mg Oral Q6H PRN Loyda Costin T, MD       alum & mag hydroxide-simeth (MAALOX/MYLANTA) 200-200-20 MG/5ML suspension 30 mL  30 mL Oral Q4H PRN Candise Crabtree, [name], MD       feeding supplement (BOOST / RESOURCE BREEZE) liquid 1 Container  1 Container Oral QID Loan Oguin T,  MD   1 Container at 04/13/22 2123   haloperidol (HALDOL) tablet 5 mg  5 mg Oral BID Wilho Sharpley, [name], MD   5 mg at 04/14/22 0831   Or   haloperidol lactate (HALDOL) injection 5 mg  5 mg Intramuscular BID Dorean Daniello, [name], MD       LORazepam (ATIVAN) tablet 1 mg  1 mg Oral BID Tanner Vigna T, MD   1 mg at 04/14/22 0831   Or   LORazepam (ATIVAN) injection 1 mg  1 mg Intramuscular BID Morry Veiga T, MD       magnesium hydroxide (MILK OF MAGNESIA) suspension 30 mL  30 mL Oral Daily PRN Bellanie Matthew T, MD       mirtazapine (REMERON) tablet 15 mg  15 mg Oral QHS Pairlee Sawtell T, MD   15 mg at 04/13/22 2121   multivitamin with  minerals tablet 1 tablet  1 tablet Oral Daily Myiah Petkus, [name], MD   1 tablet at 04/14/22 0831   nicotine (NICODERM CQ - dosed in mg/24 hr) patch 7 mg  7 mg Transdermal Daily Monifah Freehling T, MD       OLANZapine (ZYPREXA) tablet 15 mg  15 mg Oral QHS Manasvini Whatley T, MD   15 mg at 04/13/22 2119   paliperidone (INVEGA) 24 hr tablet 9 mg  9 mg Oral QHS Brenya Taulbee T, MD   9 mg at 04/13/22 2120   thiamine (VITAMIN B1) tablet 100 mg  100 mg Oral Daily Jazma Pickel, [name], MD   100 mg at 04/14/22 0831    Lab Results: No results found for this or any previous visit (from the past 53 hour(s)).  Blood Alcohol level:  Lab Results  Component Value Date   ETH <10 03/29/2022   ETH <10 36/62/9476    Metabolic Disorder Labs: Lab Results  Component Value Date   HGBA1C 5.6 10/21/2021   MPG 114 10/21/2021   MPG 111 10/02/2021   Lab Results  Component Value Date   PROLACTIN 13.6 04/16/2015   Lab Results  Component Value Date   CHOL 189 10/21/2021   TRIG 52 10/21/2021   HDL 50 10/21/2021   CHOLHDL 3.8 10/21/2021   VLDL 10 10/21/2021   LDLCALC 129 (H) 10/21/2021   LDLCALC 93 10/01/2020    Physical Findings: AIMS: Facial and Oral Movements Muscles of Facial Expression: None, normal Lips and Perioral Area: None, normal Jaw: None, normal Tongue: None, normal,Extremity Movements Upper (arms, wrists, hands, fingers): None, normal Lower (legs, knees, ankles, toes): None, normal, Trunk Movements Neck, shoulders, hips: None, normal, Overall Severity Severity of abnormal movements (highest score from questions above): None, normal Incapacitation due to abnormal movements: None, normal Patient's awareness of abnormal movements (rate only patient's report): No Awareness, Dental Status Current problems with teeth and/or dentures?: No Does patient usually wear dentures?: No  CIWA:    COWS:     Musculoskeletal: Strength & Muscle Tone: within normal limits Gait & Station: normal Patient leans:  [name]  Psychiatric Specialty Exam:  Presentation  General Appearance: Disheveled  Eye Contact:Fair  Speech:Pressured  Speech Volume:Increased  Handedness:Right   Mood and Affect  Mood:Irritable; Labile; Angry  Affect:Blunt; Labile   Thought Process  Thought Processes:Disorganized; Irrevelant  Descriptions of Associations:Loose  Orientation:Partial  Thought Content:Illogical; Rumination; Scattered; Tangential; Delusions  History of Schizophrenia/Schizoaffective disorder:Yes  Duration of Psychotic Symptoms:Greater than six months  Hallucinations:No data recorded Ideas of Reference:Percusatory; Paranoia  Suicidal Thoughts:No data recorded Homicidal Thoughts:No data recorded  Sensorium  Memory:Immediate Fair; Recent Fair; Remote Fair  Judgment:Impaired  Insight:Poor; Lacking   Art therapist  Concentration:Poor  Attention Span:Poor  Recall:Poor  [employer] of Knowledge:Poor  Language:Poor   Psychomotor Activity  Psychomotor Activity:No data recorded  Assets  Assets:Physical Health; Resilience; Housing; Social Support   Sleep  Sleep:No data recorded   Physical Exam: Physical Exam Vitals and nursing note reviewed.  Constitutional:      Appearance: Normal appearance.  HENT:     Head: Normocephalic and atraumatic.     Mouth/Throat:     Pharynx: Oropharynx is clear.  Eyes:     Pupils: Pupils are equal, round, and reactive to light.  Cardiovascular:     Rate and Rhythm: Normal rate and regular rhythm.  Pulmonary:     Effort: Pulmonary effort is normal.     Breath sounds: Normal breath sounds.  Abdominal:     General: Abdomen is flat.     Palpations: Abdomen is soft.  Musculoskeletal:        General: Normal range of motion.  Skin:    General: Skin is warm and dry.  Neurological:     General: No focal deficit present.     Mental Status: She is alert. Mental status is at baseline.  Psychiatric:        Attention and Perception: She is  inattentive.        Mood and Affect: Mood normal. Affect is blunt.        Speech: Speech is delayed.        Behavior: Behavior is withdrawn.        Thought Content: Thought content is paranoid.    Review of Systems  Constitutional: Negative.   HENT: Negative.    Eyes: Negative.   Respiratory: Negative.    Cardiovascular: Negative.   Gastrointestinal: Negative.   Musculoskeletal: Negative.   Skin: Negative.   Neurological: Negative.   Psychiatric/Behavioral: Negative.     Blood pressure 92/62, pulse 61, temperature 97.9 F (36.6 C), temperature source Oral, resp. rate 18, height 5\' 4"  (1.626 m), weight 40.4 kg, SpO2 100 %. Body mass index is 15.28 kg/m.   Treatment Plan Summary: Plan no change to medication.  Encourage patient to try and come out and participate more.  She may be a while before we are going to be able to achieve placement.  , MD 04/14/2022, 3:25 PM

## 2022-04-14 NOTE — Progress Notes (Signed)
Patient calm and pleasant during assessment denying SI/HI/AVH. Pt isolative to her room but did come out for medication. Pt compliant with medication administration per MD orders. Pt given education, support, and encouragement to be active in her treatment plan. Pt being monitored Q 15 minutes for safety per unit protocol, remains safe on the unit.

## 2022-04-14 NOTE — Group Note (Signed)
[facility] LCSW Group Therapy Note   Group Date: 04/14/2022 Start Time: 1300 End Time: 1400  Type of Therapy/Topic:  Group Therapy:  Feelings about Diagnosis  Participation Level:  Did Not Attend    Description of Group:    This group will allow patients to explore their thoughts and feelings about diagnoses they have received. Patients will be guided to explore their level of understanding and acceptance of these diagnoses. Facilitator will encourage patients to process their thoughts and feelings about the reactions of others to their diagnosis, and will guide patients in identifying ways to discuss their diagnosis with significant others in their lives. This group will be process-oriented, with patients participating in exploration of their own experiences as well as giving and receiving support and challenge from other group members.   Therapeutic Goals: 1. Patient will demonstrate understanding of diagnosis as evidence by identifying two or more symptoms of the disorder:  2. Patient will be able to express two feelings regarding the diagnosis 3. Patient will demonstrate ability to communicate their needs through discussion and/or role plays  Summary of Patient Progress: X   Therapeutic Modalities:   Cognitive Behavioral Therapy Brief Therapy Feelings Identification    Trueman Worlds R Aniyla Harling, LCSW 

## 2022-04-14 NOTE — Progress Notes (Addendum)
Patient denies SI, HI, AVH, and pain. Last BM was two days ago, but patient denies needing any PRN medication. Patient is compliant with scheduled medications. She remains isolative to herself and does not interact with staff or patients. Patient remains safe on the unit at this time.

## 2022-04-15 DIAGNOSIS — F251 Schizoaffective disorder, depressive type: Secondary | ICD-10-CM | POA: Diagnosis not present

## 2022-04-15 MED ORDER — OLANZAPINE 10 MG PO TABS
20.0000 mg | ORAL_TABLET | Freq: Every day | ORAL | Status: DC
  Administered 2022-04-15 – 2022-04-21 (×6): 20 mg via ORAL
  Filled 2022-04-15 (×6): qty 2

## 2022-04-15 NOTE — Plan of Care (Signed)
Patient rated her depression and anxiety 0/10. Patient found sitting in her room  in a chair with no lights on or the curtains pulled. Patient states " I am doing good and just want to go home. " Patient reluctant to answer any further questions. Minimal interactions with staff & peers. Denies SI,HI and AVH. Compliant with medications. Appetite better now. Support and encouragement given.

## 2022-04-15 NOTE — BH IP Treatment Plan (Signed)
Interdisciplinary Treatment and Diagnostic Plan Update  04/15/2022 Time of Session: 08:30 Child Bedward MRN: [id number]  Principal Diagnosis: Schizoaffective disorder, depressive type ([place])  Secondary Diagnoses: Principal Problem:   Schizoaffective disorder, depressive type ([place])   Current Medications:  Current Facility-Administered Medications  Medication Dose Route Frequency Provider Last Rate Last Admin   acetaminophen (TYLENOL) tablet 650 mg  650 mg Oral Q6H PRN [name], [name], MD       alum & mag hydroxide-simeth (MAALOX/MYLANTA) 200-200-20 MG/5ML suspension 30 mL  30 mL Oral Q4H PRN [name], [name], MD       feeding supplement (BOOST / RESOURCE BREEZE) liquid 1 Container  1 Container Oral QID [name], [name], MD   1 Container at 04/15/22 0818   haloperidol (HALDOL) tablet 5 mg  5 mg Oral BID [name], [name], MD   5 mg at 04/15/22 [id number]   Or   haloperidol lactate (HALDOL) injection 5 mg  5 mg Intramuscular BID [name], [name], MD       LORazepam (ATIVAN) tablet 1 mg  1 mg Oral BID [name], [name], MD   1 mg at 04/15/22 [id number]   Or   LORazepam (ATIVAN) injection 1 mg  1 mg Intramuscular BID [name], [name], MD       magnesium hydroxide (MILK OF MAGNESIA) suspension 30 mL  30 mL Oral Daily PRN [name], [name], MD       mirtazapine (REMERON) tablet 15 mg  15 mg Oral QHS [name], [name], MD   15 mg at 04/14/22 2113   multivitamin with minerals tablet 1 tablet  1 tablet Oral Daily [name], [name], MD   1 tablet at 04/15/22 [id number]   nicotine (NICODERM CQ - dosed in mg/24 hr) patch 7 mg  7 mg Transdermal Daily [name], [name], MD       OLANZapine (ZYPREXA) tablet 15 mg  15 mg Oral QHS [name], [name], MD   15 mg at 04/14/22 2113   paliperidone (INVEGA) 24 hr tablet 9 mg  9 mg Oral QHS [name], [name], MD   9 mg at 04/14/22 2112   thiamine (VITAMIN B1) tablet 100 mg  100 mg Oral Daily [name], [name], MD   100 mg at 04/15/22 [id number]   PTA Medications: Medications Prior to Admission  Medication  Sig Dispense Refill Last Dose   clonazePAM (KLONOPIN) 0.5 MG tablet Take 0.5 tablets (0.25 mg total) by mouth 2 (two) times daily. (Patient not taking: Reported on 03/30/2022) 60 tablet 0    haloperidol (HALDOL) 5 MG tablet Take 1 tablet (5 mg total) by mouth 2 (two) times daily. (Patient not taking: Reported on 03/30/2022) 60 tablet 3    midodrine (PROAMATINE) 2.5 MG tablet Take 1 tablet (2.5 mg total) by mouth 3 (three) times daily with meals. (Patient not taking: Reported on 03/30/2022)  0    mirtazapine (REMERON) 15 MG tablet Take 0.5 tablets (7.5 mg total) by mouth at bedtime. (Patient not taking: Reported on 03/30/2022) 30 tablet 2    Multiple Vitamin (MULTIVITAMIN WITH MINERALS) TABS tablet Take 1 tablet by mouth daily. (Patient not taking: Reported on 03/30/2022)      OLANZapine (ZYPREXA) 5 MG tablet Take 1 tablet (5 mg total) by mouth at bedtime. (Patient not taking: Reported on 03/30/2022) 30 tablet 2    thiamine 100 MG tablet Take 1 tablet (100 mg total) by mouth daily. (Patient not taking: Reported on 03/30/2022)       Patient  Stressors: Marital or family conflict    Patient Strengths: Supportive family/friends   Treatment Modalities: Medication Management, Group therapy, Case management,  1 to 1 session with clinician, Psychoeducation, Recreational therapy.   Physician Treatment Plan for Primary Diagnosis: Schizoaffective disorder, depressive type ([place]) Long Term Goal(s): Improvement in symptoms so as ready for discharge   Short Term Goals: Compliance with prescribed medications will improve Ability to verbalize feelings will improve Ability to demonstrate self-control will improve Ability to identify and develop effective coping behaviors will improve  Medication Management: Evaluate patient's response, side effects, and tolerance of medication regimen.  Therapeutic Interventions: 1 to 1 sessions, Unit Group sessions and Medication administration.  Evaluation of Outcomes: Not  Progressing  Physician Treatment Plan for Secondary Diagnosis: Principal Problem:   Schizoaffective disorder, depressive type ([place])  Long Term Goal(s): Improvement in symptoms so as ready for discharge   Short Term Goals: Compliance with prescribed medications will improve Ability to verbalize feelings will improve Ability to demonstrate self-control will improve Ability to identify and develop effective coping behaviors will improve     Medication Management: Evaluate patient's response, side effects, and tolerance of medication regimen.  Therapeutic Interventions: 1 to 1 sessions, Unit Group sessions and Medication administration.  Evaluation of Outcomes: Not Progressing   RN Treatment Plan for Primary Diagnosis: Schizoaffective disorder, depressive type ([place]) Long Term Goal(s): Knowledge of disease and therapeutic regimen to maintain health will improve  Short Term Goals: Ability to remain free from injury will improve, Ability to verbalize frustration and anger appropriately will improve, Ability to demonstrate self-control, Ability to participate in decision making will improve, Ability to verbalize feelings will improve, Ability to disclose and discuss suicidal ideas, Ability to identify and develop effective coping behaviors will improve, and Compliance with prescribed medications will improve  Medication Management: RN will administer medications as ordered by provider, will assess and evaluate patient's response and provide education to patient for prescribed medication. RN will report any adverse and/or side effects to prescribing provider.  Therapeutic Interventions: 1 on 1 counseling sessions, Psychoeducation, Medication administration, Evaluate responses to treatment, Monitor vital signs and CBGs as ordered, Perform/monitor CIWA, COWS, AIMS and Fall Risk screenings as ordered, Perform wound care treatments as ordered.  Evaluation of Outcomes: Not Progressing   LCSW  Treatment Plan for Primary Diagnosis: Schizoaffective disorder, depressive type ([place]) Long Term Goal(s): Safe transition to appropriate next level of care at discharge, Engage patient in therapeutic group addressing interpersonal concerns.  Short Term Goals: Engage patient in aftercare planning with referrals and resources, Increase social support, Increase ability to appropriately verbalize feelings, Increase emotional regulation, Facilitate acceptance of mental health diagnosis and concerns, and Increase skills for wellness and recovery  Therapeutic Interventions: Assess for all discharge needs, 1 to 1 time with Social worker, Explore available resources and support systems, Assess for adequacy in community support network, Educate family and significant other(s) on suicide prevention, Complete Psychosocial Assessment, Interpersonal group therapy.  Evaluation of Outcomes: Not Progressing   Progress in Treatment: Attending groups: No. Participating in groups: No. Taking medication as prescribed: Yes. Toleration medication: Yes. Family/Significant other contact made: Yes, individual(s) contacted:  guardian/sister, [name] Eckert-Lennon. Patient understands diagnosis: No. Discussing patient identified problems/goals with staff: No. Medical problems stabilized or resolved: Yes. Denies suicidal/homicidal ideation: Yes. Issues/concerns per patient self-inventory: No. Other: none.  New problem(s) identified: No, Describe:  none identified. Update 04/15/22: No changes at this time.   New Short Term/Long Term Goal(s): elimination of symptoms of psychosis, medication management  for mood stabilization; elimination of SI thoughts; development of comprehensive mental wellness plan. Update 04/15/22: No changes at this time.   Patient Goals:  "Nothing I want to work on. I want to be discharged." Update 04/15/22: No changes at this time.   Discharge Plan or Barriers: CSW will assist pt with development of  an appropriate aftercare/discharge plan. Update 04/15/22: Pt has been unwilling to engage with the treatment team around discharge.      Reason for Continuation of Hospitalization: Aggression Medication stabilization   Estimated Length of Stay: 1-7 days Update 04/15/22: No changes at this time.  Last 3 [place] Suicide Severity Risk Score: Flowsheet Row Admission (Current) from 04/08/2022 in [place] Admission (Discharged) from 10/17/2021 in [place] Admission (Discharged) from 10/02/2021 in [place] [facility]  C-SSRS RISK CATEGORY No Risk No Risk No Risk       Last PHQ 2/9 Scores:     No data to display          Scribe for Treatment Team: Shirl Harris, LCSW 04/15/2022 8:50 AM

## 2022-04-15 NOTE — Progress Notes (Signed)
[facility] MD Progress Note  04/15/2022 2:27 PM [name]  MRN:  [id number] Subjective: Patient seen and chart reviewed.  Patient is taking her medicine and mostly stays calm although isolated.  She is eating okay.  If you try to talk with her about her family or their concerns about discharge or really even bring up her family's name she will get agitated and start showing her psychosis insisting that her family went to outer space. Principal Problem: Schizoaffective disorder, depressive type ([place]) Diagnosis: Principal Problem:   Schizoaffective disorder, depressive type ([place])  Total Time spent with patient: 30 minutes  Past Psychiatric History: Past history of schizophrenia  Past Medical History:  Past Medical History:  Diagnosis Date   Anemia 10/02/2021   Non compliance w medication regimen    Schizophrenia [facility])     Past Surgical History:  Procedure Laterality Date   BIOPSY  09/25/2021   Procedure: BIOPSY;  Surgeon: Milus Banister, MD;  Location: [facility];  Service: Gastroenterology;;   ESOPHAGOGASTRODUODENOSCOPY (EGD) WITH PROPOFOL [name] 09/25/2021   Procedure: ESOPHAGOGASTRODUODENOSCOPY (EGD) WITH PROPOFOL;  Surgeon: Milus Banister, MD;  Location: [facility];  Service: Gastroenterology;  Laterality: [name];  With NGT placement   Family History: History reviewed. No pertinent family history. Family Psychiatric  History: See previous Social History:  Social History   Substance and Sexual Activity  Alcohol Use Not Currently   Comment: Refuses to disclose how much     Social History   Substance and Sexual Activity  Drug Use No   Comment: Refuses to answer    Social History   Socioeconomic History   Marital status: Single    Spouse name: Not on file   Number of children: Not on file   Years of education: Not on file   Highest education level: Not on file  Occupational History   Not on file  Tobacco Use   Smoking status: Every Day    Packs/day: 2.00    Types:  Cigarettes   Smokeless tobacco: Never  Substance and Sexual Activity   Alcohol use: Not Currently    Comment: Refuses to disclose how much   Drug use: No    Comment: Refuses to answer   Sexual activity: Not Currently    Comment: refused to answer  Other Topics Concern   Not on file  Social History Narrative   Not on file   Social Determinants of Health   Financial Resource Strain: Not on file  Food Insecurity: No Food Insecurity (04/08/2022)   Hunger Vital Sign    Worried About Running Out of Food in the Last Year: Never true    Ran Out of Food in the Last Year: Never true  Transportation Needs: No Transportation Needs (04/08/2022)   PRAPARE - Hydrologist (Medical): No    Lack of Transportation (Non-Medical): No  Physical Activity: Not on file  Stress: Not on file  Social Connections: Not on file   Additional Social History:                         Sleep: Fair  Appetite:  Fair  Current Medications: Current Facility-Administered Medications  Medication Dose Route Frequency Provider Last Rate Last Admin   acetaminophen (TYLENOL) tablet 650 mg  650 mg Oral Q6H PRN Arcelia Pals T, MD       alum & mag hydroxide-simeth (MAALOX/MYLANTA) 200-200-20 MG/5ML suspension 30 mL  30 mL Oral Q4H PRN Letzy Gullickson,  [name], MD       feeding supplement (BOOST / RESOURCE BREEZE) liquid 1 Container  1 Container Oral QID Deundre Thong, [name], MD   1 Container at 04/15/22 1144   haloperidol (HALDOL) tablet 5 mg  5 mg Oral BID Bradlee Heitman, [name], MD   5 mg at 04/15/22 7253   Or   haloperidol lactate (HALDOL) injection 5 mg  5 mg Intramuscular BID Karlea Mckibbin T, MD       LORazepam (ATIVAN) tablet 1 mg  1 mg Oral BID Roselinda Bahena T, MD   1 mg at 04/15/22 6644   Or   LORazepam (ATIVAN) injection 1 mg  1 mg Intramuscular BID Narvel Kozub T, MD       magnesium hydroxide (MILK OF MAGNESIA) suspension 30 mL  30 mL Oral Daily PRN Latif Nazareno T, MD       mirtazapine  (REMERON) tablet 15 mg  15 mg Oral QHS Lashana Spang T, MD   15 mg at 04/14/22 2113   multivitamin with minerals tablet 1 tablet  1 tablet Oral Daily Keilee Denman, [name], MD   1 tablet at 04/15/22 0347   nicotine (NICODERM CQ - dosed in mg/24 hr) patch 7 mg  7 mg Transdermal Daily Desira Alessandrini T, MD       OLANZapine (ZYPREXA) tablet 15 mg  15 mg Oral QHS Ewin Rehberg T, MD   15 mg at 04/14/22 2113   paliperidone (INVEGA) 24 hr tablet 9 mg  9 mg Oral QHS Sydney Hasten T, MD   9 mg at 04/14/22 2112   thiamine (VITAMIN B1) tablet 100 mg  100 mg Oral Daily Victory Strollo, [name], MD   100 mg at 04/15/22 4259    Lab Results: No results found for this or any previous visit (from the past 48 hour(s)).  Blood Alcohol level:  Lab Results  Component Value Date   ETH <10 03/29/2022   ETH <10 09/12/2021    Metabolic Disorder Labs: Lab Results  Component Value Date   HGBA1C 5.6 10/21/2021   MPG 114 10/21/2021   MPG 111 10/02/2021   Lab Results  Component Value Date   PROLACTIN 13.6 04/16/2015   Lab Results  Component Value Date   CHOL 189 10/21/2021   TRIG 52 10/21/2021   HDL 50 10/21/2021   CHOLHDL 3.8 10/21/2021   VLDL 10 10/21/2021   LDLCALC 129 (H) 10/21/2021   LDLCALC 93 10/01/2020    Physical Findings: AIMS: Facial and Oral Movements Muscles of Facial Expression: None, normal Lips and Perioral Area: None, normal Jaw: None, normal Tongue: None, normal,Extremity Movements Upper (arms, wrists, hands, fingers): None, normal Lower (legs, knees, ankles, toes): None, normal, Trunk Movements Neck, shoulders, hips: None, normal, Overall Severity Severity of abnormal movements (highest score from questions above): None, normal Incapacitation due to abnormal movements: None, normal Patient's awareness of abnormal movements (rate only patient's report): No Awareness, Dental Status Current problems with teeth and/or dentures?: No Does patient usually wear dentures?: No  CIWA:    COWS:      Musculoskeletal: Strength & Muscle Tone: within normal limits Gait & Station: normal Patient leans: [name]  Psychiatric Specialty Exam:  Presentation  General Appearance: Disheveled  Eye Contact:Fair  Speech:Pressured  Speech Volume:Increased  Handedness:Right   Mood and Affect  Mood:Irritable; Labile; Angry  Affect:Blunt; Labile   Thought Process  Thought Processes:Disorganized; Irrevelant  Descriptions of Associations:Loose  Orientation:Partial  Thought Content:Illogical; Rumination; Scattered; Tangential; Delusions  History of Schizophrenia/Schizoaffective  disorder:Yes  Duration of Psychotic Symptoms:Greater than six months  Hallucinations:No data recorded Ideas of Reference:Percusatory; Paranoia  Suicidal Thoughts:No data recorded Homicidal Thoughts:No data recorded  Sensorium  Memory:Immediate Fair; Recent Fair; Remote Fair  Judgment:Impaired  Insight:Poor; Lacking   Executive Functions  Concentration:Poor  Attention Span:Poor  Recall:Poor  Fund of Knowledge:Poor  Language:Poor   Psychomotor Activity  Psychomotor Activity:No data recorded  Assets  Assets:Physical Health; Resilience; Housing; Social Support   Sleep  Sleep:No data recorded   Physical Exam: Physical Exam Constitutional:      Appearance: Normal appearance.  HENT:     Head: Normocephalic and atraumatic.     Mouth/Throat:     Pharynx: Oropharynx is clear.  Eyes:     Pupils: Pupils are equal, round, and reactive to light.  Cardiovascular:     Rate and Rhythm: Normal rate and regular rhythm.  Pulmonary:     Effort: Pulmonary effort is normal.     Breath sounds: Normal breath sounds.  Abdominal:     General: Abdomen is flat.     Palpations: Abdomen is soft.  Musculoskeletal:        General: Normal range of motion.  Skin:    General: Skin is warm and dry.  Neurological:     General: No focal deficit present.     Mental Status: She is alert. Mental status  is at baseline.  Psychiatric:        Attention and Perception: Attention normal.        Mood and Affect: Mood normal. Affect is blunt.        Speech: Speech is delayed.        Behavior: Behavior is slowed.        Thought Content: Thought content is paranoid and delusional.        Cognition and Memory: Cognition is impaired.        Judgment: Judgment is impulsive.    ROS Blood pressure 98/65, pulse 64, temperature 98.5 F (36.9 C), temperature source Oral, resp. rate 16, height 5\' 4"  (1.626 m), weight 40.4 kg, SpO2 100 %. Body mass index is 15.28 kg/m.   Treatment Plan Summary: Plan reviewing medication.  We have gently been increasing the Invega.  Since she is taking medicine and still having the psychotic symptoms I may add more antipsychotic to what she is taking as long as she tolerates it.  I spoke with her sister today and updated her on the situation.  , MD 04/15/2022, 2:27 PM

## 2022-04-15 NOTE — Progress Notes (Signed)
Patient isolative to her room since the start of the shift.  She is pleasant and cooperative. She is laying in the bed since start of shift and declined snack this evening.  She denies si hi avh depression and anxiety.  Although she denies, she presents flat and depressed. She is med compliant this evening and received her meds without issue.  Will continue to monitor with q 15 minute safety checks.      C Butler-Nicholson, LPN

## 2022-04-15 NOTE — Plan of Care (Signed)
  Problem: Education: Goal: Knowledge of General Education information will improve Description: Including pain rating scale, medication(s)/side effects and non-pharmacologic comfort measures Outcome: Progressing   Problem: Nutrition: Goal: Adequate nutrition will be maintained Outcome: Progressing   Problem: Coping: Goal: Coping ability will improve Outcome: Progressing Goal: Will verbalize feelings Outcome: Progressing   Problem: Health Behavior/Discharge Planning: Goal: Compliance with prescribed medication regimen will improve Outcome: Progressing   Problem: Nutritional: Goal: Ability to achieve adequate nutritional intake will improve Outcome: Progressing   Problem: Role Relationship: Goal: Ability to interact with others will improve Outcome: Progressing

## 2022-04-15 NOTE — Group Note (Signed)
[facility] LCSW Group Therapy Note   Group Date: 04/15/2022 Start Time: 1300 End Time: 1400   Type of Therapy and Topic: Group Therapy: Avoiding Self-Sabotaging and Enabling Behaviors  Participation Level: Minimal  Mood: Blunted   Description of Group:  In this group, patients will learn how to identify obstacles, self-sabotaging and enabling behaviors, as well as: what are they, why do we do them and what needs these behaviors meet. Discuss unhealthy relationships and how to have positive healthy boundaries with those that sabotage and enable. Explore aspects of self-sabotage and enabling in yourself and how to limit these self-destructive behaviors in everyday life.   Therapeutic Goals: 1. Patient will identify one obstacle that relates to self-sabotage and enabling behaviors 2. Patient will identify one personal self-sabotaging or enabling behavior they did prior to admission 3. Patient will state a plan to change the above identified behavior 4. Patient will demonstrate ability to communicate their needs through discussion and/or role play.    Summary of Patient Progress:   Patient was present for the entirety of group session. Patient participated in opening and closing remarks. However, patient did not contribute at all to the topic of discussion despite encouraged participation.    Therapeutic Modalities:  Cognitive Behavioral Therapy Person-Centered Therapy Motivational Interviewing    Nadalyn Deringer W Shaya Altamura, LCSWA 

## 2022-04-16 DIAGNOSIS — F251 Schizoaffective disorder, depressive type: Secondary | ICD-10-CM | POA: Diagnosis not present

## 2022-04-16 NOTE — Progress Notes (Signed)
[facility] MD Progress Note  04/16/2022 1:25 PM [name]  MRN:  [id number] Subjective: Follow-up for this woman with schizoaffective disorder.  No change from how she was doing yesterday which is to say that as long as you leave her alone she is pretty harmless sits in her room staring at the wall and eats okay takes care of her hygiene okay but as soon as you start talking about discharge planning her home or anything involving her family she gets very agitated and starts making no sense. Principal Problem: Schizoaffective disorder, depressive type ([place]) Diagnosis: Principal Problem:   Schizoaffective disorder, depressive type ([place])  Total Time spent with patient: 30 minutes  Past Psychiatric History: Past history of schizophrenia  Past Medical History:  Past Medical History:  Diagnosis Date   Anemia 10/02/2021   Non compliance w medication regimen    Schizophrenia [facility])     Past Surgical History:  Procedure Laterality Date   BIOPSY  09/25/2021   Procedure: BIOPSY;  Surgeon: Milus Banister, MD;  Location: [facility];  Service: Gastroenterology;;   ESOPHAGOGASTRODUODENOSCOPY (EGD) WITH PROPOFOL [name] 09/25/2021   Procedure: ESOPHAGOGASTRODUODENOSCOPY (EGD) WITH PROPOFOL;  Surgeon: Milus Banister, MD;  Location: [facility];  Service: Gastroenterology;  Laterality: [name];  With NGT placement   Family History: History reviewed. No pertinent family history. Family Psychiatric  History: See previous Social History:  Social History   Substance and Sexual Activity  Alcohol Use Not Currently   Comment: Refuses to disclose how much     Social History   Substance and Sexual Activity  Drug Use No   Comment: Refuses to answer    Social History   Socioeconomic History   Marital status: Single    Spouse name: Not on file   Number of children: Not on file   Years of education: Not on file   Highest education level: Not on file  Occupational History   Not on file  Tobacco Use   Smoking  status: Every Day    Packs/day: 2.00    Types: Cigarettes   Smokeless tobacco: Never  Substance and Sexual Activity   Alcohol use: Not Currently    Comment: Refuses to disclose how much   Drug use: No    Comment: Refuses to answer   Sexual activity: Not Currently    Comment: refused to answer  Other Topics Concern   Not on file  Social History Narrative   Not on file   Social Determinants of Health   Financial Resource Strain: Not on file  Food Insecurity: No Food Insecurity (04/08/2022)   Hunger Vital Sign    Worried About Running Out of Food in the Last Year: Never true    Ran Out of Food in the Last Year: Never true  Transportation Needs: No Transportation Needs (04/08/2022)   PRAPARE - Hydrologist (Medical): No    Lack of Transportation (Non-Medical): No  Physical Activity: Not on file  Stress: Not on file  Social Connections: Not on file   Additional Social History:                         Sleep: Fair  Appetite:  Fair  Current Medications: Current Facility-Administered Medications  Medication Dose Route Frequency Provider Last Rate Last Admin   acetaminophen (TYLENOL) tablet 650 mg  650 mg Oral Q6H PRN Daniyah Fohl, [name], MD       alum & mag hydroxide-simeth (MAALOX/MYLANTA)  200-200-20 MG/5ML suspension 30 mL  30 mL Oral Q4H PRN Casin Federici, [name], MD       feeding supplement (BOOST / RESOURCE BREEZE) liquid 1 Container  1 Container Oral QID Garnetta Fedrick, [name], MD   1 Container at 04/16/22 1217   haloperidol (HALDOL) tablet 5 mg  5 mg Oral BID Krystl Wickware T, MD   5 mg at 04/16/22 3500   Or   haloperidol lactate (HALDOL) injection 5 mg  5 mg Intramuscular BID Mahum Betten T, MD       LORazepam (ATIVAN) tablet 1 mg  1 mg Oral BID Corynn Solberg T, MD   1 mg at 04/16/22 9381   Or   LORazepam (ATIVAN) injection 1 mg  1 mg Intramuscular BID Cherisa Brucker T, MD       magnesium hydroxide (MILK OF MAGNESIA) suspension 30 mL  30 mL Oral  Daily PRN Johnnay Pleitez T, MD       mirtazapine (REMERON) tablet 15 mg  15 mg Oral QHS Elysabeth Aust T, MD   15 mg at 04/15/22 2134   multivitamin with minerals tablet 1 tablet  1 tablet Oral Daily Rocket Gunderson, [name], MD   1 tablet at 04/16/22 8299   nicotine (NICODERM CQ - dosed in mg/24 hr) patch 7 mg  7 mg Transdermal Daily Tashala Cumbo T, MD       OLANZapine (ZYPREXA) tablet 20 mg  20 mg Oral QHS Erma Joubert T, MD   20 mg at 04/15/22 2134   paliperidone (INVEGA) 24 hr tablet 9 mg  9 mg Oral QHS Reena Borromeo T, MD   9 mg at 04/15/22 2134   thiamine (VITAMIN B1) tablet 100 mg  100 mg Oral Daily Amoni Morales, [name], MD   100 mg at 04/16/22 3716    Lab Results: No results found for this or any previous visit (from the past 48 hour(s)).  Blood Alcohol level:  Lab Results  Component Value Date   ETH <10 03/29/2022   ETH <10 09/12/2021    Metabolic Disorder Labs: Lab Results  Component Value Date   HGBA1C 5.6 10/21/2021   MPG 114 10/21/2021   MPG 111 10/02/2021   Lab Results  Component Value Date   PROLACTIN 13.6 04/16/2015   Lab Results  Component Value Date   CHOL 189 10/21/2021   TRIG 52 10/21/2021   HDL 50 10/21/2021   CHOLHDL 3.8 10/21/2021   VLDL 10 10/21/2021   LDLCALC 129 (H) 10/21/2021   LDLCALC 93 10/01/2020    Physical Findings: AIMS: Facial and Oral Movements Muscles of Facial Expression: None, normal Lips and Perioral Area: None, normal Jaw: None, normal Tongue: None, normal,Extremity Movements Upper (arms, wrists, hands, fingers): None, normal Lower (legs, knees, ankles, toes): None, normal, Trunk Movements Neck, shoulders, hips: None, normal, Overall Severity Severity of abnormal movements (highest score from questions above): None, normal Incapacitation due to abnormal movements: None, normal Patient's awareness of abnormal movements (rate only patient's report): No Awareness, Dental Status Current problems with teeth and/or dentures?: No Does patient  usually wear dentures?: No  CIWA:    COWS:     Musculoskeletal: Strength & Muscle Tone: within normal limits Gait & Station: normal Patient leans: [name]  Psychiatric Specialty Exam:  Presentation  General Appearance: Disheveled  Eye Contact:Fair  Speech:Pressured  Speech Volume:Increased  Handedness:Right   Mood and Affect  Mood:Irritable; Labile; Angry  Affect:Blunt; Labile   Thought Process  Thought Processes:Disorganized; Irrevelant  Descriptions of Associations:Loose  Orientation:Partial  Thought Content:Illogical; Rumination; Scattered; Tangential; Delusions  History of Schizophrenia/Schizoaffective disorder:Yes  Duration of Psychotic Symptoms:Greater than six months  Hallucinations:No data recorded Ideas of Reference:Percusatory; Paranoia  Suicidal Thoughts:No data recorded Homicidal Thoughts:No data recorded  Sensorium  Memory:Immediate Fair; Recent Fair; Remote Fair  Judgment:Impaired  Insight:Poor; Lacking   Executive Functions  Concentration:Poor  Attention Span:Poor  Recall:Poor  Fund of Knowledge:Poor  Language:Poor   Psychomotor Activity  Psychomotor Activity:No data recorded  Assets  Assets:Physical Health; Resilience; Housing; Social Support   Sleep  Sleep:No data recorded   Physical Exam: Physical Exam Vitals and nursing note reviewed.  Constitutional:      Appearance: Normal appearance.  HENT:     Head: Normocephalic and atraumatic.     Mouth/Throat:     Pharynx: Oropharynx is clear.  Eyes:     Pupils: Pupils are equal, round, and reactive to light.  Cardiovascular:     Rate and Rhythm: Normal rate and regular rhythm.  Pulmonary:     Effort: Pulmonary effort is normal.     Breath sounds: Normal breath sounds.  Abdominal:     General: Abdomen is flat.     Palpations: Abdomen is soft.  Musculoskeletal:        General: Normal range of motion.  Skin:    General: Skin is warm and dry.  Neurological:      General: No focal deficit present.     Mental Status: She is alert. Mental status is at baseline.  Psychiatric:        Mood and Affect: Mood normal. Affect is blunt and inappropriate.        Speech: Speech is delayed.        Behavior: Behavior is slowed.        Thought Content: Thought content is paranoid and delusional.    Review of Systems  Constitutional: Negative.   HENT: Negative.    Eyes: Negative.   Respiratory: Negative.    Cardiovascular: Negative.   Gastrointestinal: Negative.   Musculoskeletal: Negative.   Skin: Negative.   Neurological: Negative.   Psychiatric/Behavioral: Negative.     Blood pressure 98/64, pulse 61, temperature 98.4 F (36.9 C), temperature source Oral, resp. rate 18, height 5\' 4"  (1.626 m), weight 40.4 kg, SpO2 97 %. Body mass index is 15.28 kg/m.   Treatment Plan Summary: Medication management and Plan yesterday I increased her antipsychotics.  Patient is still paranoid and delusional and unable to function at home.  I spoke with her sister yesterday they would very much like her placed but at this point patient is not agreeable.  Continue to work on daily assessment hoping antipsychotics will begin to be more effective  , MD 04/16/2022, 1:25 PM

## 2022-04-16 NOTE — [facility] Group Notes (Signed)
[facility] Group Notes:  (Nursing/MHT/Case Management/Adjunct)  Date:  04/16/2022  Time:  11:15 AM  Type of Therapy:   community meeting  Participation Level:  None  Participation Quality:  Drowsy  Affect:  Flat  Cognitive:  Lacking  Insight:  None  Engagement in Group:  None  Modes of Intervention:  Discussion and Education  Summary of Progress/Problems:  Antonieta Pert 04/16/2022, 11:15 AM

## 2022-04-16 NOTE — Plan of Care (Signed)
  Problem: Elimination: Goal: Will not experience complications related to bowel motility Outcome: Progressing   Problem: Safety: Goal: Ability to remain free from injury will improve Outcome: Progressing  Patient sluggish but aroused by touch.  Patient is guarded but cooperative on rounds.  Patient is medication complaint.  Patient denies anxiety, depression, SI/HI and AVH.  Patient becomes upset when questioned, states I am not mental, I am just taking a break.  Patient has been isolative to room coming out for medication and snack only.  Patient denies any pain/discomfort.  Patient does contract for safety.  Q 15 minutes rounds in progress, will continue to monitor.

## 2022-04-16 NOTE — Plan of Care (Signed)
Patients stays in her room except for meals. Minimal interactions with staff & peers. Patient rated her depression and anxiety 0/10 and denies SI,Hi and AVH. Patient gets irritable with moe questions. States " I am not Schizophrenic." Appetite and energy level good. ADLs maintained. Support and encouragement given.

## 2022-04-16 NOTE — Progress Notes (Signed)
Recreation Therapy Notes  Date: 04/16/2022   Time: 10:50 am   Location: Craft room    Behavioral response: [name]   Intervention Topic: Decision Making    Discussion/Intervention: Patient refused to attend group.   Clinical Observations/Feedback:  Patient refused to attend group.   Nelva Hauk LRT/CTRS          Mercedes Fort 04/16/2022 11:02 AM

## 2022-04-16 NOTE — Group Note (Signed)
[facility] LCSW Group Therapy Note   Group Date: 04/16/2022 Start Time: 1300 End Time: 1400   Type of Therapy/Topic:  Group Therapy:  Balance in Life  Participation Level:  Did Not Attend   Description of Group:    This group will address the concept of balance and how it feels and looks when one is unbalanced. Patients will be encouraged to process areas in their lives that are out of balance, and identify reasons for remaining unbalanced. Facilitators will guide patients utilizing problem- solving interventions to address and correct the stressor making their life unbalanced. Understanding and applying boundaries will be explored and addressed for obtaining  and maintaining a balanced life. Patients will be encouraged to explore ways to assertively make their unbalanced needs known to significant others in their lives, using other group members and facilitator for support and feedback.  Therapeutic Goals: Patient will identify two or more emotions or situations they have that consume much of in their lives. Patient will identify signs/triggers that life has become out of balance:  Patient will identify two ways to set boundaries in order to achieve balance in their lives:  Patient will demonstrate ability to communicate their needs through discussion and/or role plays  Summary of Patient Progress: X   Therapeutic Modalities:   Cognitive Behavioral Therapy Solution-Focused Therapy Assertiveness Training   Antaeus Karel R Leasha Goldberger, LCSW 

## 2022-04-17 DIAGNOSIS — F251 Schizoaffective disorder, depressive type: Secondary | ICD-10-CM | POA: Diagnosis not present

## 2022-04-17 NOTE — Group Note (Signed)
[facility] LCSW Group Therapy Note   Group Date: 04/17/2022 Start Time: 1400 End Time: 1500  Type of Therapy and Topic:  Group Therapy:  Feelings around Relapse and Recovery  Participation Level:  Minimal    Description of Group:    Patients in this group will discuss emotions they experience before and after a relapse. They will process how experiencing these feelings, or avoidance of experiencing them, relates to having a relapse. Facilitator will guide patients to explore emotions they have related to recovery. Patients will be encouraged to process which emotions are more powerful. They will be guided to discuss the emotional reaction significant others in their lives may have to patients' relapse or recovery. Patients will be assisted in exploring ways to respond to the emotions of others without this contributing to a relapse.  Therapeutic Goals: Patient will identify two or more emotions that lead to relapse for them:  Patient will identify two emotions that result when they relapse:  Patient will identify two emotions related to recovery:  Patient will demonstrate ability to communicate their needs through discussion and/or role plays.   Summary of Patient Progress: Patient was present for the entirety of the group process. She identified her favorite season as Spring. However, she did not participate in the discussion further than this contribution to the icebreaker.    Therapeutic Modalities:   Cognitive Behavioral Therapy Solution-Focused Therapy Assertiveness Training Relapse Prevention Therapy   Shirl Harris, LCSW

## 2022-04-17 NOTE — Progress Notes (Signed)
Recreation Therapy Notes   Date: 04/17/2022    Time: 10:30 am    Location: Craft room    Behavioral response: Not Engaged    Intervention Topic:  Time Management    Discussion/Intervention:  Group content today was focused on time management. The group defined time management and identified healthy ways to manage time. Individuals expressed how much of the 24 hours they use in a day. Patients expressed how much time they use just for themselves personally. The group expressed how they have managed their time in the past. Individuals participated in the intervention "Managing Life" where they had a chance to see how much of the 24 hours they use and where it goes.   Clinical Observations/Feedback: Patient came to group and was not engaged in the group discussion or intervention.  Airam Runions LRT/CTRS             Valeda Corzine 04/17/2022 12:30 PM

## 2022-04-17 NOTE — Progress Notes (Signed)
1045: D: Pt alert and oriented. Pt denies experiencing any anxiety/depression at this time. Pt denies experiencing any pain at this time. Pt denies experiencing any SI/HI, or AVH at this time.      A: Scheduled medications administered. Support and encouragement provided. Routine safety checks conducted q15 minutes.   R: No adverse drug reactions noted. Pt verbally contracts for safety at this time. Pt interacts minimally with others on the unit. Pt remains safe at this time, will continue plan of care.

## 2022-04-17 NOTE — Plan of Care (Signed)
  Problem: Education: Goal: Knowledge of General Education information will improve Description: Including pain rating scale, medication(s)/side effects and non-pharmacologic comfort measures Outcome: Not Progressing   Problem: Education: Goal: Knowledge of the prescribed therapeutic regimen will improve Outcome: Not Progressing   

## 2022-04-17 NOTE — Progress Notes (Signed)
[facility] MD Progress Note  04/17/2022 3:34 PM [name]  MRN:  [id number] Subjective: Follow-up patient with schizoaffective disorder.  Absolutely no change to behavior.  Sits in her room for hours at a time with her arms crossed staring into space.  No complaints but asking to be discharged home.  When conversation is directed towards her home becomes angry and agitated displaying all of her delusions Principal Problem: Schizoaffective disorder, depressive type ([place]) Diagnosis: Principal Problem:   Schizoaffective disorder, depressive type ([place])  Total Time spent with patient: 30 minutes  Past Psychiatric History: Past history of schizophrenia  Past Medical History:  Past Medical History:  Diagnosis Date   Anemia 10/02/2021   Non compliance w medication regimen    Schizophrenia [facility])     Past Surgical History:  Procedure Laterality Date   BIOPSY  09/25/2021   Procedure: BIOPSY;  Surgeon: Milus Banister, MD;  Location: [facility];  Service: Gastroenterology;;   ESOPHAGOGASTRODUODENOSCOPY (EGD) WITH PROPOFOL [name] 09/25/2021   Procedure: ESOPHAGOGASTRODUODENOSCOPY (EGD) WITH PROPOFOL;  Surgeon: Milus Banister, MD;  Location: [facility];  Service: Gastroenterology;  Laterality: [name];  With NGT placement   Family History: History reviewed. No pertinent family history. Family Psychiatric  History: I have no information Social History:  Social History   Substance and Sexual Activity  Alcohol Use Not Currently   Comment: Refuses to disclose how much     Social History   Substance and Sexual Activity  Drug Use No   Comment: Refuses to answer    Social History   Socioeconomic History   Marital status: Single    Spouse name: Not on file   Number of children: Not on file   Years of education: Not on file   Highest education level: Not on file  Occupational History   Not on file  Tobacco Use   Smoking status: Every Day    Packs/day: 2.00    Types: Cigarettes   Smokeless  tobacco: Never  Substance and Sexual Activity   Alcohol use: Not Currently    Comment: Refuses to disclose how much   Drug use: No    Comment: Refuses to answer   Sexual activity: Not Currently    Comment: refused to answer  Other Topics Concern   Not on file  Social History Narrative   Not on file   Social Determinants of Health   Financial Resource Strain: Not on file  Food Insecurity: No Food Insecurity (04/08/2022)   Hunger Vital Sign    Worried About Running Out of Food in the Last Year: Never true    Ran Out of Food in the Last Year: Never true  Transportation Needs: No Transportation Needs (04/08/2022)   PRAPARE - Hydrologist (Medical): No    Lack of Transportation (Non-Medical): No  Physical Activity: Not on file  Stress: Not on file  Social Connections: Not on file   Additional Social History:                         Sleep: Fair  Appetite:  Fair  Current Medications: Current Facility-Administered Medications  Medication Dose Route Frequency Provider Last Rate Last Admin   acetaminophen (TYLENOL) tablet 650 mg  650 mg Oral Q6H PRN Keia Rask T, MD       alum & mag hydroxide-simeth (MAALOX/MYLANTA) 200-200-20 MG/5ML suspension 30 mL  30 mL Oral Q4H PRN Mazie Fencl, [name], MD  feeding supplement (BOOST / RESOURCE BREEZE) liquid 1 Container  1 Container Oral QID Maeghan Canny, [name], MD   1 Container at 04/17/22 1315   haloperidol (HALDOL) tablet 5 mg  5 mg Oral BID Medford Staheli, [name], MD   5 mg at 04/17/22 6295   Or   haloperidol lactate (HALDOL) injection 5 mg  5 mg Intramuscular BID Easton Sivertson T, MD       LORazepam (ATIVAN) tablet 1 mg  1 mg Oral BID Keigan Tafoya T, MD   1 mg at 04/17/22 2841   Or   LORazepam (ATIVAN) injection 1 mg  1 mg Intramuscular BID Courtnay Petrilla T, MD       magnesium hydroxide (MILK OF MAGNESIA) suspension 30 mL  30 mL Oral Daily PRN Xaviar Lunn T, MD       mirtazapine (REMERON) tablet 15 mg  15  mg Oral QHS Herberta Pickron, [name], MD   15 mg at 04/16/22 2115   multivitamin with minerals tablet 1 tablet  1 tablet Oral Daily Elexius Minar, [name], MD   1 tablet at 04/17/22 3244   nicotine (NICODERM CQ - dosed in mg/24 hr) patch 7 mg  7 mg Transdermal Daily Charnel Giles T, MD       OLANZapine (ZYPREXA) tablet 20 mg  20 mg Oral QHS Aneka Fagerstrom T, MD   20 mg at 04/16/22 2116   paliperidone (INVEGA) 24 hr tablet 9 mg  9 mg Oral QHS Makenly Larabee T, MD   9 mg at 04/16/22 2116   thiamine (VITAMIN B1) tablet 100 mg  100 mg Oral Daily Nicey Krah, [name], MD   100 mg at 04/17/22 0102    Lab Results: No results found for this or any previous visit (from the past 48 hour(s)).  Blood Alcohol level:  Lab Results  Component Value Date   ETH <10 03/29/2022   ETH <10 72/53/6644    Metabolic Disorder Labs: Lab Results  Component Value Date   HGBA1C 5.6 10/21/2021   MPG 114 10/21/2021   MPG 111 10/02/2021   Lab Results  Component Value Date   PROLACTIN 13.6 04/16/2015   Lab Results  Component Value Date   CHOL 189 10/21/2021   TRIG 52 10/21/2021   HDL 50 10/21/2021   CHOLHDL 3.8 10/21/2021   VLDL 10 10/21/2021   LDLCALC 129 (H) 10/21/2021   LDLCALC 93 10/01/2020    Physical Findings: AIMS: Facial and Oral Movements Muscles of Facial Expression: None, normal Lips and Perioral Area: None, normal Jaw: None, normal Tongue: None, normal,Extremity Movements Upper (arms, wrists, hands, fingers): None, normal Lower (legs, knees, ankles, toes): None, normal, Trunk Movements Neck, shoulders, hips: None, normal, Overall Severity Severity of abnormal movements (highest score from questions above): None, normal Incapacitation due to abnormal movements: None, normal Patient's awareness of abnormal movements (rate only patient's report): No Awareness, Dental Status Current problems with teeth and/or dentures?: No Does patient usually wear dentures?: No  CIWA:    COWS:     Musculoskeletal: Strength  & Muscle Tone: within normal limits Gait & Station: normal Patient leans: [name]  Psychiatric Specialty Exam:  Presentation  General Appearance:  Disheveled  Eye Contact: Fair  Speech: Pressured  Speech Volume: Increased  Handedness: Right   Mood and Affect  Mood: Irritable; Labile; Angry  Affect: Blunt; Labile   Thought Process  Thought Processes: Disorganized; Irrevelant  Descriptions of Associations:Loose  Orientation:Partial  Thought Content:Illogical; Rumination; Scattered; Tangential; Delusions  History of Schizophrenia/Schizoaffective disorder:Yes  Duration of Psychotic Symptoms:Greater than six months  Hallucinations:No data recorded Ideas of Reference:Percusatory; Paranoia  Suicidal Thoughts:No data recorded Homicidal Thoughts:No data recorded  Sensorium  Memory: Immediate Fair; Recent Fair; Remote Fair  Judgment: Impaired  Insight: Poor; Lacking   Executive Functions  Concentration: Poor  Attention Span: Poor  Recall: Poor  Fund of Knowledge: Poor  Language: Poor   Psychomotor Activity  Psychomotor Activity:No data recorded  Assets  Assets: Physical Health; Resilience; Housing; Social Support   Sleep  Sleep:No data recorded   Physical Exam: Physical Exam Vitals and nursing note reviewed.  Constitutional:      Appearance: Normal appearance.  HENT:     Head: Normocephalic and atraumatic.     Mouth/Throat:     Pharynx: Oropharynx is clear.  Eyes:     Pupils: Pupils are equal, round, and reactive to light.  Cardiovascular:     Rate and Rhythm: Normal rate and regular rhythm.  Pulmonary:     Effort: Pulmonary effort is normal.     Breath sounds: Normal breath sounds.  Abdominal:     General: Abdomen is flat.     Palpations: Abdomen is soft.  Musculoskeletal:        General: Normal range of motion.  Skin:    General: Skin is warm and dry.  Neurological:     General: No focal deficit present.      Mental Status: She is alert. Mental status is at baseline.  Psychiatric:        Attention and Perception: Attention normal.        Mood and Affect: Mood normal. Affect is blunt and angry.        Speech: Speech is tangential.        Behavior: Behavior is agitated. Behavior is not aggressive.        Thought Content: Thought content is paranoid and delusional.    Review of Systems  Constitutional: Negative.   HENT: Negative.    Eyes: Negative.   Respiratory: Negative.    Cardiovascular: Negative.   Gastrointestinal: Negative.   Musculoskeletal: Negative.   Skin: Negative.   Neurological: Negative.   Psychiatric/Behavioral: Negative.     Blood pressure (!) 103/59, pulse 71, temperature 98.7 F (37.1 C), resp. rate 12, height 5\' 4"  (1.626 m), weight 40.4 kg, SpO2 100 %. Body mass index is 15.28 kg/m.   Treatment Plan Summary: Plan we have increased the dose of her medicine for psychosis several times.  Not sure how much more improvement we will see.  Family is concerned about her safety at home.  Continue to daily try and form some rapport but not much is changing  Alethia Berthold, MD 04/17/2022, 3:34 PM

## 2022-04-18 DIAGNOSIS — F251 Schizoaffective disorder, depressive type: Secondary | ICD-10-CM | POA: Diagnosis not present

## 2022-04-18 NOTE — Progress Notes (Signed)
Pt remains reclusive to her room for most of the shift and comes out for meds and meals. She however denied SI/HI/AVH or self harm thoughts/intent. She denied feelings of depression, anxiety or any discomfort. She is pleasant upon interaction but responds with short answers. She is tending to her ADL's and eating her meals as well as drinking her ensure. She remains complaint with PO meds without any behavioral issues observed. She ambulates with a steady gait with no falls or unsafe behavior noted thus far. Q15 min observations maintained for safety and support provided as needed.

## 2022-04-18 NOTE — Progress Notes (Signed)
Patient did not attend outdoor recreation time.  

## 2022-04-18 NOTE — Progress Notes (Signed)
[facility] MD Progress Note  04/18/2022 5:32 PM [name]  MRN:  [id number] Subjective: [name] is seen on rounds.  She does not have any complaints.  No issues and no side effects from her medicine. Principal Problem: Schizoaffective disorder, depressive type ([facility]) Diagnosis: Principal Problem:   Schizoaffective disorder, depressive type ([facility])  Total Time spent with patient: 15 minutes  Past Psychiatric History: History of schizophrenia  Past Medical History:  Past Medical History:  Diagnosis Date   Anemia 10/02/2021   Non compliance w medication regimen    Schizophrenia [facility])     Past Surgical History:  Procedure Laterality Date   BIOPSY  09/25/2021   Procedure: BIOPSY;  Surgeon: Rachael Fee, MD;  Location: [facility];  Service: Gastroenterology;;   ESOPHAGOGASTRODUODENOSCOPY (EGD) WITH PROPOFOL [name] 09/25/2021   Procedure: ESOPHAGOGASTRODUODENOSCOPY (EGD) WITH PROPOFOL;  Surgeon: Rachael Fee, MD;  Location: [facility];  Service: Gastroenterology;  Laterality: [name];  With NGT placement   Family History: History reviewed. No pertinent family history.  Social History:  Social History   Substance and Sexual Activity  Alcohol Use Not Currently   Comment: Refuses to disclose how much     Social History   Substance and Sexual Activity  Drug Use No   Comment: Refuses to answer    Social History   Socioeconomic History   Marital status: Single    Spouse name: Not on file   Number of children: Not on file   Years of education: Not on file   Highest education level: Not on file  Occupational History   Not on file  Tobacco Use   Smoking status: Every Day    Packs/day: 2.00    Types: Cigarettes   Smokeless tobacco: Never  Substance and Sexual Activity   Alcohol use: Not Currently    Comment: Refuses to disclose how much   Drug use: No    Comment: Refuses to answer   Sexual activity: Not Currently    Comment: refused to answer  Other Topics Concern   Not on file   Social History Narrative   Not on file   Social Determinants of Health   Financial Resource Strain: Not on file  Food Insecurity: No Food Insecurity (04/08/2022)   Hunger Vital Sign    Worried About Running Out of Food in the Last Year: Never true    Ran Out of Food in the Last Year: Never true  Transportation Needs: No Transportation Needs (04/08/2022)   PRAPARE - Administrator, Civil Service (Medical): No    Lack of Transportation (Non-Medical): No  Physical Activity: Not on file  Stress: Not on file  Social Connections: Not on file   Additional Social History:                         Sleep: Good  Appetite:  Good  Current Medications: Current Facility-Administered Medications  Medication Dose Route Frequency Provider Last Rate Last Admin   acetaminophen (TYLENOL) tablet 650 mg  650 mg Oral Q6H PRN [name], [name], MD       alum & mag hydroxide-simeth (MAALOX/MYLANTA) 200-200-20 MG/5ML suspension 30 mL  30 mL Oral Q4H PRN [name], [name], MD       feeding supplement (BOOST / RESOURCE BREEZE) liquid 1 Container  1 Container Oral QID [name], [name], MD   1 Container at 04/18/22 1655   haloperidol (HALDOL) tablet 5 mg  5 mg Oral BID [name],  [name], MD   5 mg at 04/18/22 1654   Or   haloperidol lactate (HALDOL) injection 5 mg  5 mg Intramuscular BID [name], [name], MD       LORazepam (ATIVAN) tablet 1 mg  1 mg Oral BID [name], [name], MD   1 mg at 04/18/22 1654   Or   LORazepam (ATIVAN) injection 1 mg  1 mg Intramuscular BID [name], [name], MD       magnesium hydroxide (MILK OF MAGNESIA) suspension 30 mL  30 mL Oral Daily PRN [name], [name], MD       mirtazapine (REMERON) tablet 15 mg  15 mg Oral QHS [name], [name], MD   15 mg at 04/17/22 2102   multivitamin with minerals tablet 1 tablet  1 tablet Oral Daily [name], [name], MD   1 tablet at 04/18/22 1115   nicotine (NICODERM CQ - dosed in mg/24 hr) patch 7 mg  7 mg Transdermal Daily [name], [name],  MD       OLANZapine (ZYPREXA) tablet 20 mg  20 mg Oral QHS [name], [name], MD   20 mg at 04/17/22 2102   paliperidone (INVEGA) 24 hr tablet 9 mg  9 mg Oral QHS [name], [name], MD   9 mg at 04/17/22 2102   thiamine (VITAMIN B1) tablet 100 mg  100 mg Oral Daily [name], [name], MD   100 mg at 04/18/22 5208    Lab Results: No results found for this or any previous visit (from the past 48 hour(s)).  Blood Alcohol level:  Lab Results  Component Value Date   ETH <10 03/29/2022   ETH <10 09/12/2021    Metabolic Disorder Labs: Lab Results  Component Value Date   HGBA1C 5.6 10/21/2021   MPG 114 10/21/2021   MPG 111 10/02/2021   Lab Results  Component Value Date   PROLACTIN 13.6 04/16/2015   Lab Results  Component Value Date   CHOL 189 10/21/2021   TRIG 52 10/21/2021   HDL 50 10/21/2021   CHOLHDL 3.8 10/21/2021   VLDL 10 10/21/2021   LDLCALC 129 (H) 10/21/2021   LDLCALC 93 10/01/2020    Physical Findings: AIMS: Facial and Oral Movements Muscles of Facial Expression: None, normal Lips and Perioral Area: None, normal Jaw: None, normal Tongue: None, normal,Extremity Movements Upper (arms, wrists, hands, fingers): None, normal Lower (legs, knees, ankles, toes): None, normal, Trunk Movements Neck, shoulders, hips: None, normal, Overall Severity Severity of abnormal movements (highest score from questions above): None, normal Incapacitation due to abnormal movements: None, normal Patient's awareness of abnormal movements (rate only patient's report): No Awareness, Dental Status Current problems with teeth and/or dentures?: No Does patient usually wear dentures?: No  CIWA:    COWS:     Musculoskeletal: Strength & Muscle Tone: within normal limits Gait & Station: normal Patient leans: [name]  Psychiatric Specialty Exam:  Presentation  General Appearance:  Disheveled  Eye Contact: Fair  Speech: Pressured  Speech Volume: Increased  Handedness: Right   Mood and  Affect  Mood: Irritable; Labile; Angry  Affect: Blunt; Labile   Thought Process  Thought Processes: Disorganized; Irrevelant  Descriptions of Associations:Loose  Orientation:Partial  Thought Content:Illogical; Rumination; Scattered; Tangential; Delusions  History of Schizophrenia/Schizoaffective disorder:Yes  Duration of Psychotic Symptoms:Greater than six months  Hallucinations:No data recorded Ideas of Reference:Percusatory; Paranoia  Suicidal Thoughts:No data recorded Homicidal Thoughts:No data recorded  Sensorium  Memory: Immediate Fair; Recent Fair; Remote Fair  Judgment: Impaired  Insight: Poor; Lacking   Executive Functions  Concentration: Poor  Attention Span: Poor  Recall: Poor  Fund of Knowledge: Poor  Language: Poor   Psychomotor Activity  Psychomotor Activity:No data recorded  Assets  Assets: Physical Health; Resilience; Housing; Social Support   Sleep  Sleep:No data recorded    Blood pressure 95/63, pulse 63, temperature 98.1 F (36.7 C), temperature source Oral, resp. rate 18, height 5\' 4"  (1.626 m), weight 40.4 kg, SpO2 100 %. Body mass index is 15.28 kg/m.   Treatment Plan Summary: Daily contact with patient to assess and evaluate symptoms and progress in treatment, Medication management, and Plan continue current medications.  Parks Ranger, DO 04/18/2022, 5:32 PM

## 2022-04-19 DIAGNOSIS — F251 Schizoaffective disorder, depressive type: Secondary | ICD-10-CM | POA: Diagnosis not present

## 2022-04-19 NOTE — [facility] Group Notes (Signed)
LCSW Wellness Group Note   04/19/2022 1:00pm  Type of Group and Topic: Psychoeducational Group:  Wellness  Participation Level:  minimal  Description of Group  Wellness group introduces the topic and its focus on developing healthy habits across the spectrum and its relationship to a decrease in hospital admissions.  Six areas of wellness are discussed: physical, social spiritual, intellectual, occupational, and emotional.  Patients are asked to consider their current wellness habits and to identify areas of wellness where they are interested and able to focus on improvements.    Therapeutic Goals Patients will understand components of wellness and how they can positively impact overall health.  Patients will identify areas of wellness where they have developed good habits. Patients will identify areas of wellness where they would like to make improvements.    Summary of Patient Progress: pt was attentive during group discussion, mostly did not participate.  She did respond to CSW questions.  She identified environmental and social as positive wellness areas in her life.  She identified emotional wellness as an area that needed improvement.      Therapeutic Modalities: Cognitive Behavioral Therapy Psychoeducation    Joanne Chars, LCSW

## 2022-04-19 NOTE — Progress Notes (Signed)
[facility] MD Progress Note  04/19/2022 3:42 PM [name]  MRN:  [id number] Subjective: No changes with Merary.  No complaints.  No side effects from her medicine.  Principal Problem: Schizoaffective disorder, depressive type (HCC) Diagnosis: Principal Problem:   Schizoaffective disorder, depressive type (HCC)  Total Time spent with patient: 15 minutes  Past Psychiatric History: Schizophrenia  Past Medical History:  Past Medical History:  Diagnosis Date   Anemia 10/02/2021   Non compliance w medication regimen    Schizophrenia [facility])     Past Surgical History:  Procedure Laterality Date   BIOPSY  09/25/2021   Procedure: BIOPSY;  Surgeon: Rachael Fee, MD;  Location: [facility];  Service: Gastroenterology;;   ESOPHAGOGASTRODUODENOSCOPY (EGD) WITH PROPOFOL [name] 09/25/2021   Procedure: ESOPHAGOGASTRODUODENOSCOPY (EGD) WITH PROPOFOL;  Surgeon: Rachael Fee, MD;  Location: [facility];  Service: Gastroenterology;  Laterality: [name];  With NGT placement   Family History: History reviewed. No pertinent family history.  Social History:  Social History   Substance and Sexual Activity  Alcohol Use Not Currently   Comment: Refuses to disclose how much     Social History   Substance and Sexual Activity  Drug Use No   Comment: Refuses to answer    Social History   Socioeconomic History   Marital status: Single    Spouse name: Not on file   Number of children: Not on file   Years of education: Not on file   Highest education level: Not on file  Occupational History   Not on file  Tobacco Use   Smoking status: Every Day    Packs/day: 2.00    Types: Cigarettes   Smokeless tobacco: Never  Substance and Sexual Activity   Alcohol use: Not Currently    Comment: Refuses to disclose how much   Drug use: No    Comment: Refuses to answer   Sexual activity: Not Currently    Comment: refused to answer  Other Topics Concern   Not on file  Social History Narrative   Not on file    Social Determinants of Health   Financial Resource Strain: Not on file  Food Insecurity: No Food Insecurity (04/08/2022)   Hunger Vital Sign    Worried About Running Out of Food in the Last Year: Never true    Ran Out of Food in the Last Year: Never true  Transportation Needs: No Transportation Needs (04/08/2022)   PRAPARE - Administrator, Civil Service (Medical): No    Lack of Transportation (Non-Medical): No  Physical Activity: Not on file  Stress: Not on file  Social Connections: Not on file   Additional Social History:                         Sleep: Good  Appetite:  Good  Current Medications: Current Facility-Administered Medications  Medication Dose Route Frequency Provider Last Rate Last Admin   acetaminophen (TYLENOL) tablet 650 mg  650 mg Oral Q6H PRN [name], [name], MD       alum & mag hydroxide-simeth (MAALOX/MYLANTA) 200-200-20 MG/5ML suspension 30 mL  30 mL Oral Q4H PRN [name], [name], MD       feeding supplement (BOOST / RESOURCE BREEZE) liquid 1 Container  1 Container Oral QID [name], [name], MD   1 Container at 04/19/22 1145   haloperidol (HALDOL) tablet 5 mg  5 mg Oral BID [name], [name], MD   5 mg at 04/19/22  4098   Or   haloperidol lactate (HALDOL) injection 5 mg  5 mg Intramuscular BID [name], [name], MD       LORazepam (ATIVAN) tablet 1 mg  1 mg Oral BID [name], [name], MD   1 mg at 04/19/22 1191   Or   LORazepam (ATIVAN) injection 1 mg  1 mg Intramuscular BID [name], [name], MD       magnesium hydroxide (MILK OF MAGNESIA) suspension 30 mL  30 mL Oral Daily PRN [name], [name], MD       mirtazapine (REMERON) tablet 15 mg  15 mg Oral QHS [name], [name], MD   15 mg at 04/17/22 2102   multivitamin with minerals tablet 1 tablet  1 tablet Oral Daily [name], [name], MD   1 tablet at 04/19/22 4782   nicotine (NICODERM CQ - dosed in mg/24 hr) patch 7 mg  7 mg Transdermal Daily [name], [name], MD       OLANZapine (ZYPREXA) tablet 20  mg  20 mg Oral QHS [name], [name], MD   20 mg at 04/17/22 2102   paliperidone (INVEGA) 24 hr tablet 9 mg  9 mg Oral QHS [name], [name], MD   9 mg at 04/17/22 2102   thiamine (VITAMIN B1) tablet 100 mg  100 mg Oral Daily [name], [name], MD   100 mg at 04/19/22 9562    Lab Results: No results found for this or any previous visit (from the past 48 hour(s)).  Blood Alcohol level:  Lab Results  Component Value Date   ETH <10 03/29/2022   ETH <10 09/12/2021    Metabolic Disorder Labs: Lab Results  Component Value Date   HGBA1C 5.6 10/21/2021   MPG 114 10/21/2021   MPG 111 10/02/2021   Lab Results  Component Value Date   PROLACTIN 13.6 04/16/2015   Lab Results  Component Value Date   CHOL 189 10/21/2021   TRIG 52 10/21/2021   HDL 50 10/21/2021   CHOLHDL 3.8 10/21/2021   VLDL 10 10/21/2021   LDLCALC 129 (H) 10/21/2021   LDLCALC 93 10/01/2020    Physical Findings: AIMS: Facial and Oral Movements Muscles of Facial Expression: None, normal Lips and Perioral Area: None, normal Jaw: None, normal Tongue: None, normal,Extremity Movements Upper (arms, wrists, hands, fingers): None, normal Lower (legs, knees, ankles, toes): None, normal, Trunk Movements Neck, shoulders, hips: None, normal, Overall Severity Severity of abnormal movements (highest score from questions above): None, normal Incapacitation due to abnormal movements: None, normal Patient's awareness of abnormal movements (rate only patient's report): No Awareness, Dental Status Current problems with teeth and/or dentures?: No Does patient usually wear dentures?: No  CIWA:    COWS:     Musculoskeletal: Strength & Muscle Tone: within normal limits Gait & Station: normal Patient leans: [name]  Psychiatric Specialty Exam:  Presentation  General Appearance:  Disheveled  Eye Contact: Fair  Speech: Pressured  Speech Volume: Increased  Handedness: Right   Mood and Affect  Mood: Irritable; Labile;  Angry  Affect: Blunt; Labile   Thought Process  Thought Processes: Disorganized; Irrevelant  Descriptions of Associations:Loose  Orientation:Partial  Thought Content:Illogical; Rumination; Scattered; Tangential; Delusions  History of Schizophrenia/Schizoaffective disorder:Yes  Duration of Psychotic Symptoms:Greater than six months  Hallucinations:No data recorded Ideas of Reference:Percusatory; Paranoia  Suicidal Thoughts:No data recorded Homicidal Thoughts:No data recorded  Sensorium  Memory: Immediate Fair; Recent Fair; Remote Fair  Judgment: Impaired  Insight: Poor; Lacking   Art therapist  Concentration: Poor  Attention Span: Poor  Recall: Poor  Fund of Knowledge: Poor  Language: Poor   Psychomotor Activity  Psychomotor Activity:No data recorded  Assets  Assets: Physical Health; Resilience; Housing; Social Support   Sleep  Sleep:No data recorded    Blood pressure 103/68, pulse 63, temperature 98.4 F (36.9 C), temperature source Oral, resp. rate 18, height 5\' 4"  (1.626 m), weight 40.4 kg, SpO2 98 %. Body mass index is 15.28 kg/m.   Treatment Plan Summary: Daily contact with patient to assess and evaluate symptoms and progress in treatment, Medication management, and Plan continue current medications.  Parks Ranger, DO 04/19/2022, 3:42 PM

## 2022-04-19 NOTE — Progress Notes (Signed)
No distress noted interacting appropriately with peers and staff. Patient denies SI/HI/AVH. Patient refused evening medication regimen will continue to monitor.

## 2022-04-19 NOTE — Plan of Care (Signed)
D: Patient alert and oriented. Patient denies pain. Patient denies anxiety and depression. Patient denies SI/HI/AVH. Patient isolative to room with exception to coming out for meals and medication.  A: Scheduled medications administered to patient, per MD orders.  Support and encouragement provided to patient.  Q15 minute safety checks maintained.   R: Patient compliant with medication administration and treatment plan. No adverse drug reactions noted. Patient remains safe on the unit at this time. Problem: Education: Goal: Knowledge of [facility] Education information/materials will improve Outcome: Progressing Goal: Verbalization of understanding the information provided will improve Outcome: Progressing   Problem: Health Behavior/Discharge Planning: Goal: Compliance with treatment plan for underlying cause of condition will improve Outcome: Progressing   Problem: Physical Regulation: Goal: Ability to maintain clinical measurements within normal limits will improve Outcome: Progressing   Problem: Safety: Goal: Periods of time without injury will increase Outcome: Progressing   

## 2022-04-20 DIAGNOSIS — F251 Schizoaffective disorder, depressive type: Secondary | ICD-10-CM | POA: Diagnosis not present

## 2022-04-20 NOTE — Progress Notes (Signed)
[facility] MD Progress Note  04/20/2022 6:12 PM [name]  MRN:  [id number] Subjective: Patient remains paranoid delusional confused.  Any mention about her family drives her into a frenzy of argumentation and nonsensical talk. Principal Problem: Schizoaffective disorder, depressive type (HCC) Diagnosis: Principal Problem:   Schizoaffective disorder, depressive type (HCC)  Total Time spent with patient: 20 minutes  Past Psychiatric History: Past history of schizophrenia  Past Medical History:  Past Medical History:  Diagnosis Date   Anemia 10/02/2021   Non compliance w medication regimen    Schizophrenia [facility])     Past Surgical History:  Procedure Laterality Date   BIOPSY  09/25/2021   Procedure: BIOPSY;  Surgeon: Rachael Fee, MD;  Location: [facility];  Service: Gastroenterology;;   ESOPHAGOGASTRODUODENOSCOPY (EGD) WITH PROPOFOL [name] 09/25/2021   Procedure: ESOPHAGOGASTRODUODENOSCOPY (EGD) WITH PROPOFOL;  Surgeon: Rachael Fee, MD;  Location: [facility];  Service: Gastroenterology;  Laterality: [name];  With NGT placement   Family History: History reviewed. No pertinent family history. Family Psychiatric  History: See previous Social History:  Social History   Substance and Sexual Activity  Alcohol Use Not Currently   Comment: Refuses to disclose how much     Social History   Substance and Sexual Activity  Drug Use No   Comment: Refuses to answer    Social History   Socioeconomic History   Marital status: Single    Spouse name: Not on file   Number of children: Not on file   Years of education: Not on file   Highest education level: Not on file  Occupational History   Not on file  Tobacco Use   Smoking status: Every Day    Packs/day: 2.00    Types: Cigarettes   Smokeless tobacco: Never  Substance and Sexual Activity   Alcohol use: Not Currently    Comment: Refuses to disclose how much   Drug use: No    Comment: Refuses to answer   Sexual activity: Not  Currently    Comment: refused to answer  Other Topics Concern   Not on file  Social History Narrative   Not on file   Social Determinants of Health   Financial Resource Strain: Not on file  Food Insecurity: No Food Insecurity (04/08/2022)   Hunger Vital Sign    Worried About Running Out of Food in the Last Year: Never true    Ran Out of Food in the Last Year: Never true  Transportation Needs: No Transportation Needs (04/08/2022)   PRAPARE - Administrator, Civil Service (Medical): No    Lack of Transportation (Non-Medical): No  Physical Activity: Not on file  Stress: Not on file  Social Connections: Not on file   Additional Social History:                         Sleep: Fair  Appetite:  Fair  Current Medications: Current Facility-Administered Medications  Medication Dose Route Frequency Provider Last Rate Last Admin   acetaminophen (TYLENOL) tablet 650 mg  650 mg Oral Q6H PRN Adra Shepler T, MD       alum & mag hydroxide-simeth (MAALOX/MYLANTA) 200-200-20 MG/5ML suspension 30 mL  30 mL Oral Q4H PRN Rajanee Schuelke, [name], MD       feeding supplement (BOOST / RESOURCE BREEZE) liquid 1 Container  1 Container Oral QID Sonnie Pawloski, [name], MD   1 Container at 04/20/22 1646   haloperidol (HALDOL) tablet 5 mg  5 mg Oral BID Torry Adamczak T, MD   5 mg at 04/20/22 1646   Or   haloperidol lactate (HALDOL) injection 5 mg  5 mg Intramuscular BID Severa Jeremiah, [name], MD       LORazepam (ATIVAN) tablet 1 mg  1 mg Oral BID Sofija Antwi T, MD   1 mg at 04/20/22 1646   Or   LORazepam (ATIVAN) injection 1 mg  1 mg Intramuscular BID Cagney Steenson T, MD       magnesium hydroxide (MILK OF MAGNESIA) suspension 30 mL  30 mL Oral Daily PRN Janene Yousuf T, MD       mirtazapine (REMERON) tablet 15 mg  15 mg Oral QHS Exie Chrismer T, MD   15 mg at 04/19/22 2129   multivitamin with minerals tablet 1 tablet  1 tablet Oral Daily Sosha Shepherd, [name], MD   1 tablet at 04/20/22 0017   nicotine  (NICODERM CQ - dosed in mg/24 hr) patch 7 mg  7 mg Transdermal Daily Nirali Magouirk T, MD       OLANZapine (ZYPREXA) tablet 20 mg  20 mg Oral QHS Rainey Rodger T, MD   20 mg at 04/19/22 2128   paliperidone (INVEGA) 24 hr tablet 9 mg  9 mg Oral QHS Fabiha Rougeau T, MD   9 mg at 04/19/22 2128   thiamine (VITAMIN B1) tablet 100 mg  100 mg Oral Daily Ellizabeth Dacruz, [name], MD   100 mg at 04/20/22 4944    Lab Results: No results found for this or any previous visit (from the past 48 hour(s)).  Blood Alcohol level:  Lab Results  Component Value Date   ETH <10 03/29/2022   ETH <10 96/75/9163    Metabolic Disorder Labs: Lab Results  Component Value Date   HGBA1C 5.6 10/21/2021   MPG 114 10/21/2021   MPG 111 10/02/2021   Lab Results  Component Value Date   PROLACTIN 13.6 04/16/2015   Lab Results  Component Value Date   CHOL 189 10/21/2021   TRIG 52 10/21/2021   HDL 50 10/21/2021   CHOLHDL 3.8 10/21/2021   VLDL 10 10/21/2021   LDLCALC 129 (H) 10/21/2021   LDLCALC 93 10/01/2020    Physical Findings: AIMS: Facial and Oral Movements Muscles of Facial Expression: None, normal Lips and Perioral Area: None, normal Jaw: None, normal Tongue: None, normal,Extremity Movements Upper (arms, wrists, hands, fingers): None, normal Lower (legs, knees, ankles, toes): None, normal, Trunk Movements Neck, shoulders, hips: None, normal, Overall Severity Severity of abnormal movements (highest score from questions above): None, normal Incapacitation due to abnormal movements: None, normal Patient's awareness of abnormal movements (rate only patient's report): No Awareness, Dental Status Current problems with teeth and/or dentures?: No Does patient usually wear dentures?: No  CIWA:    COWS:     Musculoskeletal: Strength & Muscle Tone: within normal limits Gait & Station: normal Patient leans: [name]  Psychiatric Specialty Exam:  Presentation  General Appearance:  Disheveled  Eye  Contact: Fair  Speech: Pressured  Speech Volume: Increased  Handedness: Right   Mood and Affect  Mood: Irritable; Labile; Angry  Affect: Blunt; Labile   Thought Process  Thought Processes: Disorganized; Irrevelant  Descriptions of Associations:Loose  Orientation:Partial  Thought Content:Illogical; Rumination; Scattered; Tangential; Delusions  History of Schizophrenia/Schizoaffective disorder:Yes  Duration of Psychotic Symptoms:Greater than six months  Hallucinations:No data recorded Ideas of Reference:Percusatory; Paranoia  Suicidal Thoughts:No data recorded Homicidal Thoughts:No data recorded  Sensorium  Memory: Immediate Fair; Recent Fair;  Remote Fair  Judgment: Impaired  Insight: Poor; Lacking   Executive Functions  Concentration: Poor  Attention Span: Poor  Recall: Poor  Fund of Knowledge: Poor  Language: Poor   Psychomotor Activity  Psychomotor Activity:No data recorded  Assets  Assets: Physical Health; Resilience; Housing; Social Support   Sleep  Sleep:No data recorded   Physical Exam: Physical Exam Vitals and nursing note reviewed.  Constitutional:      Appearance: Normal appearance.  HENT:     Head: Normocephalic and atraumatic.     Mouth/Throat:     Pharynx: Oropharynx is clear.  Eyes:     Pupils: Pupils are equal, round, and reactive to light.  Cardiovascular:     Rate and Rhythm: Normal rate and regular rhythm.  Pulmonary:     Effort: Pulmonary effort is normal.     Breath sounds: Normal breath sounds.  Abdominal:     General: Abdomen is flat.     Palpations: Abdomen is soft.  Musculoskeletal:        General: Normal range of motion.  Skin:    General: Skin is warm and dry.  Neurological:     General: No focal deficit present.     Mental Status: She is alert. Mental status is at baseline.  Psychiatric:        Mood and Affect: Mood normal.        Speech: She is noncommunicative.        Behavior:  Behavior is agitated.        Thought Content: Thought content is paranoid and delusional.    Review of Systems  Constitutional: Negative.   HENT: Negative.    Eyes: Negative.   Respiratory: Negative.    Cardiovascular: Negative.   Gastrointestinal: Negative.   Musculoskeletal: Negative.   Skin: Negative.   Neurological: Negative.   Psychiatric/Behavioral:  The patient is nervous/anxious.    Blood pressure (!) 94/54, pulse (!) 56, temperature 98.3 F (36.8 C), temperature source Oral, resp. rate 18, height 5\' 4"  (1.626 m), weight 40.4 kg, SpO2 99 %. Body mass index is 15.28 kg/m.   Treatment Plan Summary: Medication management and Plan reviewed medicine.  Already on high dose more than 1 antipsychotic.  Minimal improvement.  Not threatening violence.  May consider discharge this week.  , MD 04/20/2022, 6:12 PM

## 2022-04-20 NOTE — Progress Notes (Signed)
Recreation Therapy Notes  Date: 04/20/2022   Time: 10:30 am    Location: Craft room    Behavioral response: Appropriate   Intervention Topic: Creative Expressions     Discussion/Intervention:  Group content on today was focused on creative expressions. The group defined creative expressions and ways they use creative expressions. Individual identified other positive ways creative expressions can be used and why it is important to express yourself. Patients participated in the intervention "expressive painting", where they had a chance to creatively express themselves.  Clinical Observations/Feedback: Patient came to group was focused on what peers and staff had to say about creative expressions.  Individual was social with peers and staff while participating in the intervention.   Odes Lolli LRT/CTRS             Prentiss Hammett 04/20/2022 12:51 PM

## 2022-04-20 NOTE — Group Note (Signed)
[facility] LCSW Group Therapy Note    Group Date: 04/20/2022 Start Time: 1400 End Time: 1500  Type of Therapy and Topic:  Group Therapy:  Overcoming Obstacles  Participation Level:  [facility] PARTICIPATION LEVEL: Minimal   Description of Group:   In this group patients will be encouraged to explore what they see as obstacles to their own wellness and recovery. They will be guided to discuss their thoughts, feelings, and behaviors related to these obstacles. The group will process together ways to cope with barriers, with attention given to specific choices patients can make. Each patient will be challenged to identify changes they are motivated to make in order to overcome their obstacles. This group will be process-oriented, with patients participating in exploration of their own experiences as well as giving and receiving support and challenge from other group members.  Therapeutic Goals: 1. Patient will identify personal and current obstacles as they relate to admission. 2. Patient will identify barriers that currently interfere with their wellness or overcoming obstacles.  3. Patient will identify feelings, thought process and behaviors related to these barriers. 4. Patient will identify two changes they are willing to make to overcome these obstacles:    Summary of Patient Progress Patient was present for the entirety of the group process. During the icebreaker she stated that she had not thought about it. Outside of her minimal participation in the icebreaker, there was no further participation.    Therapeutic Modalities:   Cognitive Behavioral Therapy Solution Focused Therapy Motivational Interviewing Relapse Prevention Therapy   Shirl Harris, LCSW

## 2022-04-20 NOTE — BH IP Treatment Plan (Signed)
Interdisciplinary Treatment and Diagnostic Plan Update  04/20/2022 Time of Session: 08:30 [name] MRN: [id number]  Principal Diagnosis: Schizoaffective disorder, depressive type (HCC)  Secondary Diagnoses: Principal Problem:   Schizoaffective disorder, depressive type (HCC)   Current Medications:  Current Facility-Administered Medications  Medication Dose Route Frequency Provider Last Rate Last Admin   acetaminophen (TYLENOL) tablet 650 mg  650 mg Oral Q6H PRN [name], [name], MD       alum & mag hydroxide-simeth (MAALOX/MYLANTA) 200-200-20 MG/5ML suspension 30 mL  30 mL Oral Q4H PRN [name], [name], MD       feeding supplement (BOOST / RESOURCE BREEZE) liquid 1 Container  1 Container Oral QID [name], [name], MD   1 Container at 04/20/22 0805   haloperidol (HALDOL) tablet 5 mg  5 mg Oral BID [name], [name], MD   5 mg at 04/20/22 5366   Or   haloperidol lactate (HALDOL) injection 5 mg  5 mg Intramuscular BID [name], [name], MD       LORazepam (ATIVAN) tablet 1 mg  1 mg Oral BID [name], [name], MD   1 mg at 04/20/22 4403   Or   LORazepam (ATIVAN) injection 1 mg  1 mg Intramuscular BID [name], [name], MD       magnesium hydroxide (MILK OF MAGNESIA) suspension 30 mL  30 mL Oral Daily PRN [name], [name], MD       mirtazapine (REMERON) tablet 15 mg  15 mg Oral QHS [name], [name], MD   15 mg at 04/19/22 2129   multivitamin with minerals tablet 1 tablet  1 tablet Oral Daily [name], [name], MD   1 tablet at 04/20/22 4742   nicotine (NICODERM CQ - dosed in mg/24 hr) patch 7 mg  7 mg Transdermal Daily [name], [name], MD       OLANZapine (ZYPREXA) tablet 20 mg  20 mg Oral QHS [name], [name], MD   20 mg at 04/19/22 2128   paliperidone (INVEGA) 24 hr tablet 9 mg  9 mg Oral QHS [name], [name], MD   9 mg at 04/19/22 2128   thiamine (VITAMIN B1) tablet 100 mg  100 mg Oral Daily [name], [name], MD   100 mg at 04/20/22 5956   PTA Medications: Medications Prior to Admission  Medication  Sig Dispense Refill Last Dose   clonazePAM (KLONOPIN) 0.5 MG tablet Take 0.5 tablets (0.25 mg total) by mouth 2 (two) times daily. (Patient not taking: Reported on 03/30/2022) 60 tablet 0    haloperidol (HALDOL) 5 MG tablet Take 1 tablet (5 mg total) by mouth 2 (two) times daily. (Patient not taking: Reported on 03/30/2022) 60 tablet 3    midodrine (PROAMATINE) 2.5 MG tablet Take 1 tablet (2.5 mg total) by mouth 3 (three) times daily with meals. (Patient not taking: Reported on 03/30/2022)  0    mirtazapine (REMERON) 15 MG tablet Take 0.5 tablets (7.5 mg total) by mouth at bedtime. (Patient not taking: Reported on 03/30/2022) 30 tablet 2    Multiple Vitamin (MULTIVITAMIN WITH MINERALS) TABS tablet Take 1 tablet by mouth daily. (Patient not taking: Reported on 03/30/2022)      OLANZapine (ZYPREXA) 5 MG tablet Take 1 tablet (5 mg total) by mouth at bedtime. (Patient not taking: Reported on 03/30/2022) 30 tablet 2    thiamine 100 MG tablet Take 1 tablet (100 mg total) by mouth daily. (Patient not taking: Reported on 03/30/2022)       Patient  Stressors: Marital or family conflict    Patient Strengths: Supportive family/friends   Treatment Modalities: Medication Management, Group therapy, Case management,  1 to 1 session with clinician, Psychoeducation, Recreational therapy.   Physician Treatment Plan for Primary Diagnosis: Schizoaffective disorder, depressive type (HCC) Long Term Goal(s): Improvement in symptoms so as ready for discharge   Short Term Goals: Compliance with prescribed medications will improve Ability to verbalize feelings will improve Ability to demonstrate self-control will improve Ability to identify and develop effective coping behaviors will improve  Medication Management: Evaluate patient's response, side effects, and tolerance of medication regimen.  Therapeutic Interventions: 1 to 1 sessions, Unit Group sessions and Medication administration.  Evaluation of Outcomes: Not  Progressing  Physician Treatment Plan for Secondary Diagnosis: Principal Problem:   Schizoaffective disorder, depressive type (HCC)  Long Term Goal(s): Improvement in symptoms so as ready for discharge   Short Term Goals: Compliance with prescribed medications will improve Ability to verbalize feelings will improve Ability to demonstrate self-control will improve Ability to identify and develop effective coping behaviors will improve     Medication Management: Evaluate patient's response, side effects, and tolerance of medication regimen.  Therapeutic Interventions: 1 to 1 sessions, Unit Group sessions and Medication administration.  Evaluation of Outcomes: Not Progressing   RN Treatment Plan for Primary Diagnosis: Schizoaffective disorder, depressive type (HCC) Long Term Goal(s): Knowledge of disease and therapeutic regimen to maintain health will improve  Short Term Goals: Ability to remain free from injury will improve, Ability to verbalize frustration and anger appropriately will improve, Ability to demonstrate self-control, Ability to participate in decision making will improve, Ability to verbalize feelings will improve, Ability to disclose and discuss suicidal ideas, Ability to identify and develop effective coping behaviors will improve, and Compliance with prescribed medications will improve  Medication Management: RN will administer medications as ordered by provider, will assess and evaluate patient's response and provide education to patient for prescribed medication. RN will report any adverse and/or side effects to prescribing provider.  Therapeutic Interventions: 1 on 1 counseling sessions, Psychoeducation, Medication administration, Evaluate responses to treatment, Monitor vital signs and CBGs as ordered, Perform/monitor CIWA, COWS, AIMS and Fall Risk screenings as ordered, Perform wound care treatments as ordered.  Evaluation of Outcomes: Not Progressing   LCSW  Treatment Plan for Primary Diagnosis: Schizoaffective disorder, depressive type (HCC) Long Term Goal(s): Safe transition to appropriate next level of care at discharge, Engage patient in therapeutic group addressing interpersonal concerns.  Short Term Goals: Engage patient in aftercare planning with referrals and resources, Increase social support, Increase ability to appropriately verbalize feelings, Increase emotional regulation, Facilitate acceptance of mental health diagnosis and concerns, and Increase skills for wellness and recovery  Therapeutic Interventions: Assess for all discharge needs, 1 to 1 time with Social worker, Explore available resources and support systems, Assess for adequacy in community support network, Educate family and significant other(s) on suicide prevention, Complete Psychosocial Assessment, Interpersonal group therapy.  Evaluation of Outcomes: Not Progressing   Progress in Treatment: Attending groups: Yes. Participating in groups: No. Taking medication as prescribed: Yes. Toleration medication: Yes. Family/Significant other contact made: Yes, individual(s) contacted:  guardian/sister, [name] Boghosian-Lennon. Patient understands diagnosis: No. Discussing patient identified problems/goals with staff: No. Medical problems stabilized or resolved: Yes. Denies suicidal/homicidal ideation: Yes. Issues/concerns per patient self-inventory: No. Other: none.  New problem(s) identified: No, Describe:  none identified. Update 04/15/22: No changes at this time. Update 04/20/22: No changes at this time.    New Short Term/Long Term  Goal(s): elimination of symptoms of psychosis, medication management for mood stabilization; elimination of SI thoughts; development of comprehensive mental wellness plan. Update 04/15/22: No changes at this time. Update 04/20/22: No changes at this time.   Patient Goals:  "Nothing I want to work on. I want to be discharged." Update 04/15/22: No changes  at this time. Update 04/20/22: No changes at this time.   Discharge Plan or Barriers: CSW will assist pt with development of an appropriate aftercare/discharge plan. Update 04/15/22: Pt has been unwilling to engage with the treatment team around discharge. Update 04/20/22: No changes at this time.      Reason for Continuation of Hospitalization: Aggression Medication stabilization   Estimated Length of Stay: 1-7 days Update 04/15/22: No changes at this time. Update 04/20/22: TBD  Last 3 [place] Suicide Severity Risk Score: Flowsheet Row Admission (Current) from 04/08/2022 in [facility] (Discharged) from 10/17/2021 in [facility] (Discharged) from 10/02/2021 in [facility]  C-SSRS RISK CATEGORY No Risk No Risk No Risk       Last PHQ 2/9 Scores:     No data to display          Scribe for Treatment Team: Shirl Harris, LCSW 04/20/2022 9:07 AM

## 2022-04-20 NOTE — Plan of Care (Signed)
D: Patient alert and oriented. Patient denies pain. Patient denies anxiety and depression. Patient denies SI/HI/AVH. Patient has been isolative to room during shift with exception to coming out for meals and medication.  A: Scheduled medications administered to patient, per MD orders.  Support and encouragement provided to patient.  Q15 minute safety checks maintained.   R: Patient compliant with medication administration and treatment plan. No adverse drug reactions noted. Patient remains safe on the unit at this time. Problem: Education: Goal: Knowledge of [facility] Education information/materials will improve Outcome: Progressing Goal: Verbalization of understanding the information provided will improve Outcome: Progressing   Problem: Health Behavior/Discharge Planning: Goal: Compliance with treatment plan for underlying cause of condition will improve Outcome: Progressing   Problem: Physical Regulation: Goal: Ability to maintain clinical measurements within normal limits will improve Outcome: Progressing   Problem: Safety: Goal: Periods of time without injury will increase Outcome: Progressing   

## 2022-04-20 NOTE — Progress Notes (Signed)
Patient presents with flat affect. Patient isolative to self and room. Minimal interaction with staff, no interaction with peers. Out for meds only this shift. Denies any Si, HI, AVH. Medication compliant, appropriate with staff and peers. Encouragement and support provided. Safety checks maintained. Medications given as prescribed. Pt receptive and remains safe on unit with q 15 min checks.

## 2022-04-20 NOTE — Progress Notes (Signed)
   04/20/22 0000  Psych Admission Type (Psych Patients Only)  Admission Status Involuntary  Psychosocial Assessment  Patient Complaints None  Eye Contact Brief  Facial Expression Flat  Affect Flat  Speech Logical/coherent  Interaction Forwards little  Motor Activity Other (Comment) (appropriate)  Appearance/Hygiene Unremarkable  Behavior Characteristics Cooperative;Guarded  Mood Pleasant  Thought Process  Coherency WDL  Content WDL  Delusions None reported or observed  Perception WDL  Hallucination None reported or observed  Judgment Limited  Confusion None  Danger to Self  Current suicidal ideation? Denies  Danger to Others  Danger to Others None reported or observed

## 2022-04-20 NOTE — Plan of Care (Signed)
  Problem: Safety: Goal: Ability to remain free from injury will improve Outcome: Progressing   Problem: Education: Goal: Will be free of psychotic symptoms Outcome: Progressing

## 2022-04-21 DIAGNOSIS — F251 Schizoaffective disorder, depressive type: Secondary | ICD-10-CM | POA: Diagnosis not present

## 2022-04-21 NOTE — Group Note (Signed)
[facility] LCSW Group Therapy Note   Group Date: 04/21/2022 Start Time: 1300 End Time: 1400   Type of Therapy and Topic: Group Therapy: Avoiding Self-Sabotaging and Enabling Behaviors  Participation Level: Minimal  Mood:  Blunted   Description of Group:  In this group, patients will learn how to identify obstacles, self-sabotaging and enabling behaviors, as well as: what are they, why do we do them and what needs these behaviors meet. Discuss unhealthy relationships and how to have positive healthy boundaries with those that sabotage and enable. Explore aspects of self-sabotage and enabling in yourself and how to limit these self-destructive behaviors in everyday life.   Therapeutic Goals: 1. Patient will identify one obstacle that relates to self-sabotage and enabling behaviors 2. Patient will identify one personal self-sabotaging or enabling behavior they did prior to admission 3. Patient will state a plan to change the above identified behavior 4. Patient will demonstrate ability to communicate their needs through discussion and/or role play.    Summary of Patient Progress: Patient was present for the entirety of group session. Patient participated in opening and closing remarks. However, patient did not contribute at all to the topic of discussion despite encouraged participation.    Therapeutic Modalities:  Cognitive Behavioral Therapy Person-Centered Therapy Motivational Interviewing    Durenda Hurt, [place]

## 2022-04-21 NOTE — Plan of Care (Signed)
  Problem: Nutrition: Goal: Adequate nutrition will be maintained Outcome: Progressing   Problem: Health Behavior/Discharge Planning: Goal: Compliance with prescribed medication regimen will improve Outcome: Progressing   Problem: Role Relationship: Goal: Ability to communicate needs accurately will improve Outcome: Progressing

## 2022-04-21 NOTE — Progress Notes (Signed)
Patient denies SI, HI, and AVH. Affect is flat and patient is guarded. She rates depression, anxiety, and pain as a 0/10. Patient states she just wants to go home. Patient compliant with scheduled medications, but refused the nicotine patch and Ensure drink. Patient remains safe on the unit at this time.

## 2022-04-21 NOTE — Progress Notes (Signed)
Recreation Therapy Notes   Date: 04/21/2022   Time: 10:20 am    Location: Craft room    Behavioral response: Appropriate   Intervention Topic: Self-care     Discussion/Intervention:  Group content today was focused on Self-Care. The group defined self-care and some positive ways they care for themselves. Individuals expressed ways and reasons why they neglected any self-care in the past. Patients described ways to improve self-care in the future. The group explained what could happen if they did not do any self-care activities at all. The group participated in the intervention "self-care assessment" where they had a chance to discover some of their weaknesses and strengths in self- care. Patient came up with a self-care plan to improve themselves in the future.  Clinical Observations/Feedback: Patient came to group was focused on what peers and staff had to say about self-care.  Individual was social with peers and staff while participating in the intervention.   Edric Fetterman LRT/CTRS             Hiroshi Krummel 04/21/2022 12:29 PM

## 2022-04-21 NOTE — [facility] Group Notes (Signed)
[facility] Group Notes:  (Nursing/MHT/Case Management/Adjunct)  Date:  04/21/2022  Time:  9:09 AM  Type of Therapy:   Community Meeting  Participation Level:  Active  Participation Quality:  Appropriate and Attentive  Affect:  Appropriate  Cognitive:  Alert and Appropriate  Insight:  Appropriate  Engagement in Group:  Engaged  Modes of Intervention:  Discussion, Education, and Support  Summary of Progress/Problems:  [name] 04/21/2022, 9:09 AM

## 2022-04-21 NOTE — Progress Notes (Signed)
[facility] MD Progress Note  04/21/2022 4:31 PM [name]  MRN:  [id number] Subjective: No change to presentation.  Largely quiet and to herself but angry if any discussion is initiated Principal Problem: Schizoaffective disorder, depressive type ([place]) Diagnosis: Principal Problem:   Schizoaffective disorder, depressive type ([place])  Total Time spent with patient: 30 minutes  Past Psychiatric History: Past history of schizophrenia or schizoaffective disorder  Past Medical History:  Past Medical History:  Diagnosis Date   Anemia 10/02/2021   Non compliance w medication regimen    Schizophrenia [facility])     Past Surgical History:  Procedure Laterality Date   BIOPSY  09/25/2021   Procedure: BIOPSY;  Surgeon: Milus Banister, MD;  Location: [facility];  Service: Gastroenterology;;   ESOPHAGOGASTRODUODENOSCOPY (EGD) WITH PROPOFOL [name] 09/25/2021   Procedure: ESOPHAGOGASTRODUODENOSCOPY (EGD) WITH PROPOFOL;  Surgeon: Milus Banister, MD;  Location: [facility];  Service: Gastroenterology;  Laterality: [name];  With NGT placement   Family History: History reviewed. No pertinent family history. Family Psychiatric  History: See previous Social History:  Social History   Substance and Sexual Activity  Alcohol Use Not Currently   Comment: Refuses to disclose how much     Social History   Substance and Sexual Activity  Drug Use No   Comment: Refuses to answer    Social History   Socioeconomic History   Marital status: Single    Spouse name: Not on file   Number of children: Not on file   Years of education: Not on file   Highest education level: Not on file  Occupational History   Not on file  Tobacco Use   Smoking status: Every Day    Packs/day: 2.00    Types: Cigarettes   Smokeless tobacco: Never  Substance and Sexual Activity   Alcohol use: Not Currently    Comment: Refuses to disclose how much   Drug use: No    Comment: Refuses to answer   Sexual activity: Not Currently     Comment: refused to answer  Other Topics Concern   Not on file  Social History Narrative   Not on file   Social Determinants of Health   Financial Resource Strain: Not on file  Food Insecurity: No Food Insecurity (04/08/2022)   Hunger Vital Sign    Worried About Running Out of Food in the Last Year: Never true    Ran Out of Food in the Last Year: Never true  Transportation Needs: No Transportation Needs (04/08/2022)   PRAPARE - Hydrologist (Medical): No    Lack of Transportation (Non-Medical): No  Physical Activity: Not on file  Stress: Not on file  Social Connections: Not on file   Additional Social History:                         Sleep: Fair  Appetite:  Poor  Current Medications: Current Facility-Administered Medications  Medication Dose Route Frequency Provider Last Rate Last Admin   acetaminophen (TYLENOL) tablet 650 mg  650 mg Oral Q6H PRN Kama Cammarano T, MD       alum & mag hydroxide-simeth (MAALOX/MYLANTA) 200-200-20 MG/5ML suspension 30 mL  30 mL Oral Q4H PRN Ziyonna Christner, [name], MD       feeding supplement (BOOST / RESOURCE BREEZE) liquid 1 Container  1 Container Oral QID Camerin Jimenez, [name], MD   1 Container at 04/20/22 1646   haloperidol (HALDOL) tablet 5 mg  5  mg Oral BID Burwell Bethel, [name], MD   5 mg at 04/21/22 4098   Or   haloperidol lactate (HALDOL) injection 5 mg  5 mg Intramuscular BID Edie Darley, [name], MD       LORazepam (ATIVAN) tablet 1 mg  1 mg Oral BID Daysia Vandenboom T, MD   1 mg at 04/21/22 0725   Or   LORazepam (ATIVAN) injection 1 mg  1 mg Intramuscular BID Tajana Crotteau T, MD       magnesium hydroxide (MILK OF MAGNESIA) suspension 30 mL  30 mL Oral Daily PRN Taylan Marez T, MD       mirtazapine (REMERON) tablet 15 mg  15 mg Oral QHS Arvetta Araque T, MD   15 mg at 04/20/22 2105   multivitamin with minerals tablet 1 tablet  1 tablet Oral Daily Rosibel Giacobbe, [name], MD   1 tablet at 04/21/22 1191   nicotine (NICODERM CQ - dosed  in mg/24 hr) patch 7 mg  7 mg Transdermal Daily Dalayla Aldredge T, MD       OLANZapine (ZYPREXA) tablet 20 mg  20 mg Oral QHS Tanecia Mccay T, MD   20 mg at 04/20/22 2105   paliperidone (INVEGA) 24 hr tablet 9 mg  9 mg Oral QHS Belma Dyches T, MD   9 mg at 04/20/22 2106   thiamine (VITAMIN B1) tablet 100 mg  100 mg Oral Daily Edmond Ginsberg, [name], MD   100 mg at 04/21/22 4782    Lab Results: No results found for this or any previous visit (from the past 48 hour(s)).  Blood Alcohol level:  Lab Results  Component Value Date   ETH <10 03/29/2022   ETH <10 09/12/2021    Metabolic Disorder Labs: Lab Results  Component Value Date   HGBA1C 5.6 10/21/2021   MPG 114 10/21/2021   MPG 111 10/02/2021   Lab Results  Component Value Date   PROLACTIN 13.6 04/16/2015   Lab Results  Component Value Date   CHOL 189 10/21/2021   TRIG 52 10/21/2021   HDL 50 10/21/2021   CHOLHDL 3.8 10/21/2021   VLDL 10 10/21/2021   LDLCALC 129 (H) 10/21/2021   LDLCALC 93 10/01/2020    Physical Findings: AIMS: Facial and Oral Movements Muscles of Facial Expression: None, normal Lips and Perioral Area: None, normal Jaw: None, normal Tongue: None, normal,Extremity Movements Upper (arms, wrists, hands, fingers): None, normal Lower (legs, knees, ankles, toes): None, normal, Trunk Movements Neck, shoulders, hips: None, normal, Overall Severity Severity of abnormal movements (highest score from questions above): None, normal Incapacitation due to abnormal movements: None, normal Patient's awareness of abnormal movements (rate only patient's report): No Awareness, Dental Status Current problems with teeth and/or dentures?: No Does patient usually wear dentures?: No  CIWA:    COWS:     Musculoskeletal: Strength & Muscle Tone: within normal limits Gait & Station: normal Patient leans: [name]  Psychiatric Specialty Exam:  Presentation  General Appearance:  Disheveled  Eye  Contact: Fair  Speech: Pressured  Speech Volume: Increased  Handedness: Right   Mood and Affect  Mood: Irritable; Labile; Angry  Affect: Blunt; Labile   Thought Process  Thought Processes: Disorganized; Irrevelant  Descriptions of Associations:Loose  Orientation:Partial  Thought Content:Illogical; Rumination; Scattered; Tangential; Delusions  History of Schizophrenia/Schizoaffective disorder:Yes  Duration of Psychotic Symptoms:Greater than six months  Hallucinations:No data recorded Ideas of Reference:Percusatory; Paranoia  Suicidal Thoughts:No data recorded Homicidal Thoughts:No data recorded  Sensorium  Memory: Immediate Fair; Recent Fair; Remote  Fair  Judgment: Impaired  Insight: Poor; Lacking   Executive Functions  Concentration: Poor  Attention Span: Poor  Recall: Poor  Fund of Knowledge: Poor  Language: Poor   Psychomotor Activity  Psychomotor Activity:No data recorded  Assets  Assets: Physical Health; Resilience; Housing; Social Support   Sleep  Sleep:No data recorded   Physical Exam: Physical Exam Vitals reviewed.  Constitutional:      Appearance: Normal appearance.  HENT:     Head: Normocephalic and atraumatic.     Mouth/Throat:     Pharynx: Oropharynx is clear.  Eyes:     Pupils: Pupils are equal, round, and reactive to light.  Cardiovascular:     Rate and Rhythm: Normal rate and regular rhythm.  Pulmonary:     Effort: Pulmonary effort is normal.     Breath sounds: Normal breath sounds.  Abdominal:     General: Abdomen is flat.     Palpations: Abdomen is soft.  Musculoskeletal:        General: Normal range of motion.  Skin:    General: Skin is warm and dry.  Neurological:     General: No focal deficit present.     Mental Status: She is alert. Mental status is at baseline.  Psychiatric:        Attention and Perception: She is inattentive.        Mood and Affect: Mood normal. Affect is blunt.         Speech: Speech normal.        Thought Content: Thought content normal.    Review of Systems  Constitutional: Negative.   HENT: Negative.    Eyes: Negative.   Respiratory: Negative.    Cardiovascular: Negative.   Gastrointestinal: Negative.   Musculoskeletal: Negative.   Skin: Negative.   Neurological: Negative.   Psychiatric/Behavioral: Negative.     Blood pressure 107/73, pulse (!) 59, temperature 98.4 F (36.9 C), temperature source Oral, resp. rate 20, height 5\' 4"  (1.626 m), weight 40.4 kg, SpO2 100 %. Body mass index is 15.28 kg/m.   Treatment Plan Summary: Plan minimal change.  Stable but not hostile or threatening.  Anticipate likely discharge by the end of the week  Alethia Berthold, MD 04/21/2022, 4:31 PM

## 2022-04-22 DIAGNOSIS — F251 Schizoaffective disorder, depressive type: Secondary | ICD-10-CM | POA: Diagnosis not present

## 2022-04-22 MED ORDER — OLANZAPINE 10 MG PO TABS
30.0000 mg | ORAL_TABLET | Freq: Every day | ORAL | Status: DC
  Administered 2022-04-24 – 2022-05-24 (×31): 30 mg via ORAL
  Filled 2022-04-22 (×32): qty 3

## 2022-04-22 NOTE — Group Note (Signed)
[facility] LCSW Group Therapy Note   Group Date: 04/22/2022 Start Time: 1300 End Time: 1400   Type of Therapy/Topic:  Group Therapy:  Emotion Regulation  Participation Level:  None    Description of Group:    The purpose of this group is to assist patients in learning to regulate negative emotions and experience positive emotions. Patients will be guided to discuss ways in which they have been vulnerable to their negative emotions. These vulnerabilities will be juxtaposed with experiences of positive emotions or situations, and patients challenged to use positive emotions to combat negative ones. Special emphasis will be placed on coping with negative emotions in conflict situations, and patients will process healthy conflict resolution skills.  Therapeutic Goals: Patient will identify two positive emotions or experiences to reflect on in order to balance out negative emotions:  Patient will label two or more emotions that they find the most difficult to experience:  Patient will be able to demonstrate positive conflict resolution skills through discussion or role plays:   Summary of Patient Progress: Patient was present for the entirety of the group process. However, outside of participating in the icebreaker during the beginning of the class, pt did not participate in the larger discussion.   Therapeutic Modalities:   Cognitive Behavioral Therapy Feelings Identification Dialectical Behavioral Therapy   Shirl Harris, LCSW

## 2022-04-22 NOTE — Progress Notes (Signed)
Patient remains isolative to her room.  She is either in her bed or sits in a chair in the corner of her room.  Prefers to keep all the lights off and curtains closed.  Denies si  hi avh depression and anxiety. Although she denies all she presents flat, labile and depressed. Had some agitation this evening when qhs meds were due. Patient stated loudly that she just took medication.This staff informed her that she had taken meds earlier in the day and informed her of the current time.  Patient reluctantly came to the med room and received her medication.  Will continue to monitor with q 15 minute safety checks.     C Butler-Nicholson, LPN

## 2022-04-22 NOTE — Plan of Care (Signed)
  Problem: Education: Goal: Verbalization of understanding the information provided will improve Outcome: Progressing   Problem: Activity: Goal: Sleeping patterns will improve Outcome: Progressing   

## 2022-04-22 NOTE — Progress Notes (Signed)
[facility] MD Progress Note  04/22/2022 2:46 PM [name]  MRN:  [id number] Subjective: Follow-up 61 year old woman with schizophrenia.  No new complaint.  Continues to be absolutely insistent that her family does not exist.  Not cooperating with any group attendance.  Takes her medicine.  Eats okay.  Not violent or threatening. Principal Problem: Schizoaffective disorder, depressive type ([place]) Diagnosis: Principal Problem:   Schizoaffective disorder, depressive type ([place])  Total Time spent with patient: 30 minutes  Past Psychiatric History: Past history of schizophrenia  Past Medical History:  Past Medical History:  Diagnosis Date   Anemia 10/02/2021   Non compliance w medication regimen    Schizophrenia [facility])     Past Surgical History:  Procedure Laterality Date   BIOPSY  09/25/2021   Procedure: BIOPSY;  Surgeon: Milus Banister, MD;  Location: [facility];  Service: Gastroenterology;;   ESOPHAGOGASTRODUODENOSCOPY (EGD) WITH PROPOFOL [name] 09/25/2021   Procedure: ESOPHAGOGASTRODUODENOSCOPY (EGD) WITH PROPOFOL;  Surgeon: Milus Banister, MD;  Location: [facility];  Service: Gastroenterology;  Laterality: [name];  With NGT placement   Family History: History reviewed. No pertinent family history. Family Psychiatric  History: See previous Social History:  Social History   Substance and Sexual Activity  Alcohol Use Not Currently   Comment: Refuses to disclose how much     Social History   Substance and Sexual Activity  Drug Use No   Comment: Refuses to answer    Social History   Socioeconomic History   Marital status: Single    Spouse name: Not on file   Number of children: Not on file   Years of education: Not on file   Highest education level: Not on file  Occupational History   Not on file  Tobacco Use   Smoking status: Every Day    Packs/day: 2.00    Types: Cigarettes   Smokeless tobacco: Never  Substance and Sexual Activity   Alcohol use: Not Currently    Comment:  Refuses to disclose how much   Drug use: No    Comment: Refuses to answer   Sexual activity: Not Currently    Comment: refused to answer  Other Topics Concern   Not on file  Social History Narrative   Not on file   Social Determinants of Health   Financial Resource Strain: Not on file  Food Insecurity: No Food Insecurity (04/08/2022)   Hunger Vital Sign    Worried About Running Out of Food in the Last Year: Never true    Ran Out of Food in the Last Year: Never true  Transportation Needs: No Transportation Needs (04/08/2022)   PRAPARE - Hydrologist (Medical): No    Lack of Transportation (Non-Medical): No  Physical Activity: Not on file  Stress: Not on file  Social Connections: Not on file   Additional Social History:                         Sleep: Fair  Appetite:  Negative  Current Medications: Current Facility-Administered Medications  Medication Dose Route Frequency Provider Last Rate Last Admin   acetaminophen (TYLENOL) tablet 650 mg  650 mg Oral Q6H PRN Zonya Gudger T, MD       alum & mag hydroxide-simeth (MAALOX/MYLANTA) 200-200-20 MG/5ML suspension 30 mL  30 mL Oral Q4H PRN Jayvier Burgher, [name], MD       feeding supplement (BOOST / RESOURCE BREEZE) liquid 1 Container  1 Container Oral QID  Aqsa Sensabaugh, [name], MD   1 Container at 04/22/22 1203   haloperidol (HALDOL) tablet 5 mg  5 mg Oral BID Dylen Mcelhannon, [name], MD   5 mg at 04/22/22 2229   Or   haloperidol lactate (HALDOL) injection 5 mg  5 mg Intramuscular BID Lynnlee Revels, [name], MD       LORazepam (ATIVAN) tablet 1 mg  1 mg Oral BID Vonnie Ligman T, MD   1 mg at 04/22/22 7989   Or   LORazepam (ATIVAN) injection 1 mg  1 mg Intramuscular BID Kamori Barbier T, MD       magnesium hydroxide (MILK OF MAGNESIA) suspension 30 mL  30 mL Oral Daily PRN Rasa Degrazia T, MD       mirtazapine (REMERON) tablet 15 mg  15 mg Oral QHS Tavius Turgeon T, MD   15 mg at 04/21/22 2125   multivitamin with minerals  tablet 1 tablet  1 tablet Oral Daily Oreta Soloway, [name], MD   1 tablet at 04/22/22 2119   nicotine (NICODERM CQ - dosed in mg/24 hr) patch 7 mg  7 mg Transdermal Daily Kaiyan Luczak T, MD       OLANZapine (ZYPREXA) tablet 20 mg  20 mg Oral QHS Malerie Eakins T, MD   20 mg at 04/21/22 2125   paliperidone (INVEGA) 24 hr tablet 9 mg  9 mg Oral QHS Kylieann Eagles T, MD   9 mg at 04/21/22 2125   thiamine (VITAMIN B1) tablet 100 mg  100 mg Oral Daily Christain Mcraney, [name], MD   100 mg at 04/22/22 4174    Lab Results: No results found for this or any previous visit (from the past 48 hour(s)).  Blood Alcohol level:  Lab Results  Component Value Date   ETH <10 03/29/2022   ETH <10 09/12/2021    Metabolic Disorder Labs: Lab Results  Component Value Date   HGBA1C 5.6 10/21/2021   MPG 114 10/21/2021   MPG 111 10/02/2021   Lab Results  Component Value Date   PROLACTIN 13.6 04/16/2015   Lab Results  Component Value Date   CHOL 189 10/21/2021   TRIG 52 10/21/2021   HDL 50 10/21/2021   CHOLHDL 3.8 10/21/2021   VLDL 10 10/21/2021   LDLCALC 129 (H) 10/21/2021   LDLCALC 93 10/01/2020    Physical Findings: AIMS: Facial and Oral Movements Muscles of Facial Expression: None, normal Lips and Perioral Area: None, normal Jaw: None, normal Tongue: None, normal,Extremity Movements Upper (arms, wrists, hands, fingers): None, normal Lower (legs, knees, ankles, toes): None, normal, Trunk Movements Neck, shoulders, hips: None, normal, Overall Severity Severity of abnormal movements (highest score from questions above): None, normal Incapacitation due to abnormal movements: None, normal Patient's awareness of abnormal movements (rate only patient's report): No Awareness, Dental Status Current problems with teeth and/or dentures?: No Does patient usually wear dentures?: No  CIWA:    COWS:     Musculoskeletal: Strength & Muscle Tone: within normal limits Gait & Station: normal Patient leans:  [name]  Psychiatric Specialty Exam:  Presentation  General Appearance:  Disheveled  Eye Contact: Fair  Speech: Pressured  Speech Volume: Increased  Handedness: Right   Mood and Affect  Mood: Irritable; Labile; Angry  Affect: Blunt; Labile   Thought Process  Thought Processes: Disorganized; Irrevelant  Descriptions of Associations:Loose  Orientation:Partial  Thought Content:Illogical; Rumination; Scattered; Tangential; Delusions  History of Schizophrenia/Schizoaffective disorder:Yes  Duration of Psychotic Symptoms:Greater than six months  Hallucinations:No data recorded Ideas of  Reference:Percusatory; Paranoia  Suicidal Thoughts:No data recorded Homicidal Thoughts:No data recorded  Sensorium  Memory: Immediate Fair; Recent Fair; Remote Fair  Judgment: Impaired  Insight: Poor; Lacking   Executive Functions  Concentration: Poor  Attention Span: Poor  Recall: Poor  Fund of Knowledge: Poor  Language: Poor   Psychomotor Activity  Psychomotor Activity:No data recorded  Assets  Assets: Physical Health; Resilience; Housing; Social Support   Sleep  Sleep:No data recorded   Physical Exam: Physical Exam Vitals and nursing note reviewed.  Constitutional:      Appearance: Normal appearance.  HENT:     Head: Normocephalic and atraumatic.     Mouth/Throat:     Pharynx: Oropharynx is clear.  Eyes:     Pupils: Pupils are equal, round, and reactive to light.  Cardiovascular:     Rate and Rhythm: Normal rate and regular rhythm.  Pulmonary:     Effort: Pulmonary effort is normal.     Breath sounds: Normal breath sounds.  Abdominal:     General: Abdomen is flat.     Palpations: Abdomen is soft.  Musculoskeletal:        General: Normal range of motion.  Skin:    General: Skin is warm and dry.  Neurological:     General: No focal deficit present.     Mental Status: She is alert. Mental status is at baseline.  Psychiatric:         Attention and Perception: She is inattentive.        Mood and Affect: Mood normal. Affect is blunt.        Speech: Speech is tangential.        Behavior: Behavior is withdrawn.        Thought Content: Thought content is paranoid.        Cognition and Memory: Memory is impaired.        Judgment: Judgment is impulsive.    Review of Systems  Constitutional: Negative.   HENT: Negative.    Eyes: Negative.   Respiratory: Negative.    Cardiovascular: Negative.   Gastrointestinal: Negative.   Musculoskeletal: Negative.   Skin: Negative.   Neurological: Negative.   Psychiatric/Behavioral:  The patient is nervous/anxious.    Blood pressure 104/64, pulse 63, temperature 98.3 F (36.8 C), resp. rate 14, height 5\' 4"  (1.626 m), weight 40.4 kg, SpO2 99 %. Body mass index is 15.28 kg/m.   Treatment Plan Summary: Medication management and Plan I am going to look into possibly further increasing her olanzapine which she seems to tolerate okay.  We still plan on likely discharge by Friday  Wednesday, MD 04/22/2022, 2:46 PM

## 2022-04-22 NOTE — Plan of Care (Signed)
D: Patient alert and oriented. Patient denies pain. Patient denies anxiety and depression. Patient denies SI/HI/AVH. Patient isolative to self and room with exception to coming out for medication and meals.  A: Scheduled medications administered to patient, per MD orders.  Support and encouragement provided to patient.  Q15 minute safety checks maintained.   R: Patient compliant with medication administration and treatment plan. No adverse drug reactions noted. Patient remains safe on the unit at this time.  Problem: Education: Goal: Knowledge of [facility] Education information/materials will improve Outcome: Progressing Goal: Verbalization of understanding the information provided will improve Outcome: Progressing   Problem: Health Behavior/Discharge Planning: Goal: Compliance with treatment plan for underlying cause of condition will improve Outcome: Progressing   Problem: Physical Regulation: Goal: Ability to maintain clinical measurements within normal limits will improve Outcome: Progressing   Problem: Safety: Goal: Periods of time without injury will increase Outcome: Progressing

## 2022-04-23 ENCOUNTER — Other Ambulatory Visit: Payer: Self-pay

## 2022-04-23 DIAGNOSIS — F251 Schizoaffective disorder, depressive type: Secondary | ICD-10-CM | POA: Diagnosis not present

## 2022-04-23 MED ORDER — MIRTAZAPINE 15 MG PO TABS
15.0000 mg | ORAL_TABLET | Freq: Every day | ORAL | 0 refills | Status: DC
  Filled 2022-04-23: qty 10, 10d supply, fill #0

## 2022-04-23 MED ORDER — HALOPERIDOL 5 MG PO TABS
5.0000 mg | ORAL_TABLET | Freq: Two times a day (BID) | ORAL | 0 refills | Status: DC
  Filled 2022-04-23: qty 20, 10d supply, fill #0

## 2022-04-23 MED ORDER — OLANZAPINE 15 MG PO TABS
30.0000 mg | ORAL_TABLET | Freq: Every day | ORAL | 0 refills | Status: DC
  Filled 2022-04-23: qty 20, 10d supply, fill #0

## 2022-04-23 MED ORDER — THIAMINE HCL 100 MG PO TABS
100.0000 mg | ORAL_TABLET | Freq: Every day | ORAL | 0 refills | Status: DC
  Filled 2022-04-23: qty 10, 10d supply, fill #0

## 2022-04-23 MED ORDER — NICOTINE 7 MG/24HR TD PT24
7.0000 mg | MEDICATED_PATCH | Freq: Every day | TRANSDERMAL | 0 refills | Status: DC
  Filled 2022-04-23: qty 14, 14d supply, fill #0

## 2022-04-23 NOTE — Progress Notes (Signed)
Recreation Therapy Notes   Date: 04/23/2022  Time: 10:40 am   Location: Courtyard       Behavioral response: [name]   Intervention Topic: Leisure   Discussion/Intervention: Patient refused to attend group.   Clinical Observations/Feedback:  Patient refused to attend group.    Sharicka Pogorzelski LRT/CTRS         Corey Laski 04/23/2022 12:40 PM

## 2022-04-23 NOTE — Progress Notes (Signed)
[facility] MD Progress Note  04/23/2022 11:30 AM [name]  MRN:  [id number] Subjective: Follow-up 61 year old woman with schizophrenia or schizoaffective disorder.  Really no clinical change.  Continues to spend most of her time sitting in her room in the dark with her arms crossed not doing anything.  When approached she just gets irritable talking about how angry she is that she has not been discharged already.  Insight remains poor.  She is not violent however and not threatening and has been compliant with medicine Principal Problem: Schizoaffective disorder, depressive type ([facility]) Diagnosis: Principal Problem:   Schizoaffective disorder, depressive type ([facility])  Total Time spent with patient: 30 minutes  Past Psychiatric History: Patient has a long history of psychosis and appears to have gotten worse as she has gotten older.  Has had some catatonia at times currently is still showing signs of paranoia despite being on full dose antipsychotic medicine.  Past Medical History:  Past Medical History:  Diagnosis Date   Anemia 10/02/2021   Non compliance w medication regimen    Schizophrenia [facility])     Past Surgical History:  Procedure Laterality Date   BIOPSY  09/25/2021   Procedure: BIOPSY;  Surgeon: Rachael Fee, MD;  Location: [facility];  Service: Gastroenterology;;   ESOPHAGOGASTRODUODENOSCOPY (EGD) WITH PROPOFOL [name] 09/25/2021   Procedure: ESOPHAGOGASTRODUODENOSCOPY (EGD) WITH PROPOFOL;  Surgeon: Rachael Fee, MD;  Location: [facility];  Service: Gastroenterology;  Laterality: [name];  With NGT placement   Family History: History reviewed. No pertinent family history. Family Psychiatric  History: See previous Social History:  Social History   Substance and Sexual Activity  Alcohol Use Not Currently   Comment: Refuses to disclose how much     Social History   Substance and Sexual Activity  Drug Use No   Comment: Refuses to answer    Social History   Socioeconomic History    Marital status: Single    Spouse name: Not on file   Number of children: Not on file   Years of education: Not on file   Highest education level: Not on file  Occupational History   Not on file  Tobacco Use   Smoking status: Every Day    Packs/day: 2.00    Types: Cigarettes   Smokeless tobacco: Never  Substance and Sexual Activity   Alcohol use: Not Currently    Comment: Refuses to disclose how much   Drug use: No    Comment: Refuses to answer   Sexual activity: Not Currently    Comment: refused to answer  Other Topics Concern   Not on file  Social History Narrative   Not on file   Social Determinants of Health   Financial Resource Strain: Not on file  Food Insecurity: No Food Insecurity (04/08/2022)   Hunger Vital Sign    Worried About Running Out of Food in the Last Year: Never true    Ran Out of Food in the Last Year: Never true  Transportation Needs: No Transportation Needs (04/08/2022)   PRAPARE - Administrator, Civil Service (Medical): No    Lack of Transportation (Non-Medical): No  Physical Activity: Not on file  Stress: Not on file  Social Connections: Not on file   Additional Social History:                         Sleep: Fair  Appetite:  Fair  Current Medications: Current Facility-Administered Medications  Medication Dose Route  Frequency Provider Last Rate Last Admin   acetaminophen (TYLENOL) tablet 650 mg  650 mg Oral Q6H PRN Khadejah Son T, MD       alum & mag hydroxide-simeth (MAALOX/MYLANTA) 200-200-20 MG/5ML suspension 30 mL  30 mL Oral Q4H PRN Jcion Buddenhagen, [name], MD       feeding supplement (BOOST / RESOURCE BREEZE) liquid 1 Container  1 Container Oral QID Kenetha Cozza, [name], MD   1 Container at 04/22/22 1647   haloperidol (HALDOL) tablet 5 mg  5 mg Oral BID Teron Blais T, MD   5 mg at 04/23/22 6701   Or   haloperidol lactate (HALDOL) injection 5 mg  5 mg Intramuscular BID Marketia Stallsmith, [name], MD       LORazepam (ATIVAN) tablet 1  mg  1 mg Oral BID Deddrick Saindon T, MD   1 mg at 04/23/22 4103   Or   LORazepam (ATIVAN) injection 1 mg  1 mg Intramuscular BID Niti Leisure T, MD       magnesium hydroxide (MILK OF MAGNESIA) suspension 30 mL  30 mL Oral Daily PRN Terrisa Curfman T, MD       mirtazapine (REMERON) tablet 15 mg  15 mg Oral QHS Horton Ellithorpe T, MD   15 mg at 04/21/22 2125   multivitamin with minerals tablet 1 tablet  1 tablet Oral Daily Alecea Trego, [name], MD   1 tablet at 04/23/22 0813   nicotine (NICODERM CQ - dosed in mg/24 hr) patch 7 mg  7 mg Transdermal Daily Hanley Rispoli T, MD       OLANZapine (ZYPREXA) tablet 30 mg  30 mg Oral QHS Zayaan Kozak T, MD       thiamine (VITAMIN B1) tablet 100 mg  100 mg Oral Daily Kaydenn Mclear T, MD   100 mg at 04/23/22 0813    Lab Results: No results found for this or any previous visit (from the past 48 hour(s)).  Blood Alcohol level:  Lab Results  Component Value Date   ETH <10 03/29/2022   ETH <10 08/19/4386    Metabolic Disorder Labs: Lab Results  Component Value Date   HGBA1C 5.6 10/21/2021   MPG 114 10/21/2021   MPG 111 10/02/2021   Lab Results  Component Value Date   PROLACTIN 13.6 04/16/2015   Lab Results  Component Value Date   CHOL 189 10/21/2021   TRIG 52 10/21/2021   HDL 50 10/21/2021   CHOLHDL 3.8 10/21/2021   VLDL 10 10/21/2021   LDLCALC 129 (H) 10/21/2021   LDLCALC 93 10/01/2020    Physical Findings: AIMS: Facial and Oral Movements Muscles of Facial Expression: None, normal Lips and Perioral Area: None, normal Jaw: None, normal Tongue: None, normal,Extremity Movements Upper (arms, wrists, hands, fingers): None, normal Lower (legs, knees, ankles, toes): None, normal, Trunk Movements Neck, shoulders, hips: None, normal, Overall Severity Severity of abnormal movements (highest score from questions above): None, normal Incapacitation due to abnormal movements: None, normal Patient's awareness of abnormal movements (rate only patient's  report): No Awareness, Dental Status Current problems with teeth and/or dentures?: No Does patient usually wear dentures?: No  CIWA:    COWS:     Musculoskeletal: Strength & Muscle Tone: within normal limits Gait & Station: normal Patient leans: [name]  Psychiatric Specialty Exam:  Presentation  General Appearance:  Disheveled  Eye Contact: Fair  Speech: Pressured  Speech Volume: Increased  Handedness: Right   Mood and Affect  Mood: Irritable; Labile; Angry  Affect: Blunt; Labile  Thought Process  Thought Processes: Disorganized; Irrevelant  Descriptions of Associations:Loose  Orientation:Partial  Thought Content:Illogical; Rumination; Scattered; Tangential; Delusions  History of Schizophrenia/Schizoaffective disorder:Yes  Duration of Psychotic Symptoms:Greater than six months  Hallucinations:No data recorded Ideas of Reference:Percusatory; Paranoia  Suicidal Thoughts:No data recorded Homicidal Thoughts:No data recorded  Sensorium  Memory: Immediate Fair; Recent Fair; Remote Fair  Judgment: Impaired  Insight: Poor; Lacking   Executive Functions  Concentration: Poor  Attention Span: Poor  Recall: Poor  Fund of Knowledge: Poor  Language: Poor   Psychomotor Activity  Psychomotor Activity:No data recorded  Assets  Assets: Physical Health; Resilience; Housing; Social Support   Sleep  Sleep:No data recorded   Physical Exam: Physical Exam Vitals reviewed.  Constitutional:      Appearance: Normal appearance.  HENT:     Head: Normocephalic and atraumatic.     Mouth/Throat:     Pharynx: Oropharynx is clear.  Eyes:     Pupils: Pupils are equal, round, and reactive to light.  Cardiovascular:     Rate and Rhythm: Normal rate and regular rhythm.  Pulmonary:     Effort: Pulmonary effort is normal.     Breath sounds: Normal breath sounds.  Abdominal:     General: Abdomen is flat.     Palpations: Abdomen is soft.   Musculoskeletal:        General: Normal range of motion.  Skin:    General: Skin is warm and dry.  Neurological:     General: No focal deficit present.     Mental Status: She is alert. Mental status is at baseline.  Psychiatric:        Attention and Perception: She is inattentive.        Mood and Affect: Mood normal. Affect is blunt.        Speech: Speech is tangential.        Behavior: Behavior is agitated. Behavior is not aggressive.        Thought Content: Thought content is paranoid.    Review of Systems  Constitutional: Negative.   HENT: Negative.    Eyes: Negative.   Respiratory: Negative.    Cardiovascular: Negative.   Gastrointestinal: Negative.   Musculoskeletal: Negative.   Skin: Negative.   Neurological: Negative.   Psychiatric/Behavioral:  Negative for depression, hallucinations, substance abuse and suicidal ideas. The patient is nervous/anxious.    Blood pressure 101/74, pulse 64, temperature 97.6 F (36.4 C), temperature source Oral, resp. rate 18, height 5\' 4"  (1.626 m), weight 40.4 kg, SpO2 100 %. Body mass index is 15.28 kg/m.   Treatment Plan Summary: Plan I reviewed her chart.  She is currently getting 3 different antipsychotics and there is really no evidence that the addition of the Invega made any difference.  She had previously done fine with Haldol and olanzapine.  I am going to stop the Invega in preparation for discharge.  Patient does not currently appear to be an acute danger although she is still having some symptoms.  I understand that family is working on guardianship.  , MD 04/23/2022, 11:30 AM

## 2022-04-23 NOTE — Group Note (Signed)
[place] LCSW Group Therapy Note   Group Date: 04/23/2022 Start Time: 1300 End Time: 1400   Type of Therapy/Topic:  Group Therapy:  Emotion Regulation  Participation Level:  Did Not Attend   Mood:  Description of Group:    The purpose of this group is to assist patients in learning to regulate negative emotions and experience positive emotions. Patients will be guided to discuss ways in which they have been vulnerable to their negative emotions. These vulnerabilities will be juxtaposed with experiences of positive emotions or situations, and patients challenged to use positive emotions to combat negative ones. Special emphasis will be placed on coping with negative emotions in conflict situations, and patients will process healthy conflict resolution skills.  Therapeutic Goals: Patient will identify two positive emotions or experiences to reflect on in order to balance out negative emotions:  Patient will label two or more emotions that they find the most difficult to experience:  Patient will be able to demonstrate positive conflict resolution skills through discussion or role plays:   Summary of Patient Progress: Patient did not attend group despite encouraged participation.     Therapeutic Modalities:   Cognitive Behavioral Therapy Feelings Identification Dialectical Behavioral Therapy   Durenda Hurt, [place]

## 2022-04-23 NOTE — [facility] Counselor (Signed)
CSW attempted to contact legal guardian, Chinelo Benn, [phone].  CSW unable to leave voicemail as the voicemail box has been full.  Assunta Curtis, MSW, LCSW 04/23/2022 3:45 PM

## 2022-04-23 NOTE — Progress Notes (Signed)
Patient has been in her room in the bed since the start of the shift.  She refuse evening snack as well as her qhs meds this evening. She denies si hi avh depression and anxiety.  Request that staff stop questioning her mental status because there is nothing wrong with her. She presents flat and depressed and remains in the bed in the dark.  Will continue to offer support and encouragement. Patient is safe on the unt with q15 minute safty checks.     C Butler-Nicholson, LPN

## 2022-04-23 NOTE — Progress Notes (Signed)
Pt denies SI/HI/AVH and verbally agrees to approach staff if these become apparent or before harming themselves/others. Rates depression 0/10. Rates anxiety 0/10. Rates pain 0/10. Pt seemed to be in a better mood this morning. When writer went to get her, pt stated, "Is it time for meds?" Pt cam willingly. Pt seen and heard in her room talking to herself. Pt was asked when was the last time she took a shower. Pt stated that it had been 3 days and that water is not coming out of the shower. Writer when and turned the shower on and assured he pt that it was water. Pt ended up taking a shower. Pt still isolates to her room but pt was in the dayroom for a little bit this evening. Scheduled medications administered to pt, per MD orders. RN provided support and encouragement to pt. Q15 min safety checks implemented and continued. Pt safe on the unit. RN will continue to monitor and intervene as needed.  Problem: Education: Goal: Will be free of psychotic symptoms Outcome: Progressing Goal: Knowledge of the prescribed therapeutic regimen will improve Outcome: Progressing   04/23/22 0813  Psych Admission Type (Psych Patients Only)  Admission Status Involuntary  Psychosocial Assessment  Patient Complaints None  Eye Contact Brief  Facial Expression Flat;Sad  Affect Flat;Sullen  Speech Logical/coherent  Interaction Guarded;Forwards little;Isolative  Motor Activity Slow  Appearance/Hygiene In scrubs;Poor hygiene  Behavior Characteristics Unwilling to participate;Anxious  Mood Anxious;Sullen;Sad  Thought Process  Coherency WDL  Content WDL  Delusions None reported or observed  Perception WDL  Hallucination None reported or observed  Judgment Limited  Confusion None  Danger to Self  Current suicidal ideation? Denies  Danger to Others  Danger to Others None reported or observed

## 2022-04-23 NOTE — Progress Notes (Signed)
Patient has been isolative to her room since the start of the shift.  She has declined snack again this evening.  She is laying in the bed and appeared to be sleep upon my approach. She denies si  hi  avh depression and anxiety.  She is refusing her qhs medication, stating that she does "not need any of that stuff." Staff attempted to encourage patient to take, but she continued to refuse. Patient is safe on the unit with q15 minute safety rounds.  Will continue to monitor.    C Butler-Nicholson, LPN

## 2022-04-24 DIAGNOSIS — F251 Schizoaffective disorder, depressive type: Secondary | ICD-10-CM | POA: Diagnosis not present

## 2022-04-24 NOTE — Group Note (Signed)
[facility] LCSW Group Therapy Note   Group Date: 04/24/2022 Start Time: 1300 End Time: 1400  Type of Therapy and Topic:  Group Therapy:  Feelings around Relapse and Recovery  Participation Level:  Did Not Attend   Mood:  Description of Group:    Patients in this group will discuss emotions they experience before and after a relapse. They will process how experiencing these feelings, or avoidance of experiencing them, relates to having a relapse. Facilitator will guide patients to explore emotions they have related to recovery. Patients will be encouraged to process which emotions are more powerful. They will be guided to discuss the emotional reaction significant others in their lives may have to patients' relapse or recovery. Patients will be assisted in exploring ways to respond to the emotions of others without this contributing to a relapse.  Therapeutic Goals: Patient will identify two or more emotions that lead to relapse for them:  Patient will identify two emotions that result when they relapse:  Patient will identify two emotions related to recovery:  Patient will demonstrate ability to communicate their needs through discussion and/or role plays.   Summary of Patient Progress:   Group not held due to complex discharge planning.    Therapeutic Modalities:   Cognitive Behavioral Therapy Solution-Focused Therapy Assertiveness Training Relapse Prevention Therapy   [name] Aaryav Hopfensperger, LCSW 

## 2022-04-24 NOTE — [facility] Group Notes (Signed)
[place] Group Notes:  (Nursing/MHT/Case Management/Adjunct)  Date:  04/24/2022  Time:  8:26 PM  Type of Therapy:   Wrap up  Participation Level:  Active  Participation Quality:  Appropriate  Affect:  Appropriate  Cognitive:  Alert  Insight:  Good  Engagement in Group:  Engaged and she want to be discharge.  Modes of Intervention:  Support  Summary of Progress/Problems:  [name] 04/24/2022, 8:26 PM
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
## 2022-04-24 NOTE — Progress Notes (Signed)
[facility] MD Progress Note  04/24/2022 10:17 AM [name]  MRN:  [id number] Subjective: Patient seen for follow-up.  61 year old woman with schizoaffective disorder.  Remains isolative and disagreeable with insistence that her family does not exist.  We had hoped to discharge her today as she seems to perhaps be at her new baseline but family is not comfortable with her returning home.  Patient herself remains delusional difficult to engage despite being on pretty high doses of medicine Principal Problem: Schizoaffective disorder, depressive type ([place]) Diagnosis: Principal Problem:   Schizoaffective disorder, depressive type ([place])  Total Time spent with patient: 30 minutes  Past Psychiatric History: Past history of recurrent psychotic disorder  Past Medical History:  Past Medical History:  Diagnosis Date   Anemia 10/02/2021   Non compliance w medication regimen    Schizophrenia ([place])     Past Surgical History:  Procedure Laterality Date   BIOPSY  09/25/2021   Procedure: BIOPSY;  Surgeon: Milus Banister, MD;  Location: [facility];  Service: Gastroenterology;;   ESOPHAGOGASTRODUODENOSCOPY (EGD) WITH PROPOFOL [name] 09/25/2021   Procedure: ESOPHAGOGASTRODUODENOSCOPY (EGD) WITH PROPOFOL;  Surgeon: Milus Banister, MD;  Location: [facility];  Service: Gastroenterology;  Laterality: [name];  With NGT placement   Family History: History reviewed. No pertinent family history. Family Psychiatric  History: See previous Social History:  Social History   Substance and Sexual Activity  Alcohol Use Not Currently   Comment: Refuses to disclose how much     Social History   Substance and Sexual Activity  Drug Use No   Comment: Refuses to answer    Social History   Socioeconomic History   Marital status: Single    Spouse name: Not on file   Number of children: Not on file   Years of education: Not on file   Highest education level: Not on file  Occupational History   Not on file  Tobacco  Use   Smoking status: Every Day    Packs/day: 2.00    Types: Cigarettes   Smokeless tobacco: Never  Substance and Sexual Activity   Alcohol use: Not Currently    Comment: Refuses to disclose how much   Drug use: No    Comment: Refuses to answer   Sexual activity: Not Currently    Comment: refused to answer  Other Topics Concern   Not on file  Social History Narrative   Not on file   Social Determinants of Health   Financial Resource Strain: Not on file  Food Insecurity: No Food Insecurity (04/08/2022)   Hunger Vital Sign    Worried About Running Out of Food in the Last Year: Never true    Ran Out of Food in the Last Year: Never true  Transportation Needs: No Transportation Needs (04/08/2022)   PRAPARE - Hydrologist (Medical): No    Lack of Transportation (Non-Medical): No  Physical Activity: Not on file  Stress: Not on file  Social Connections: Not on file   Additional Social History:                         Sleep: Fair  Appetite:  Poor  Current Medications: Current Facility-Administered Medications  Medication Dose Route Frequency Provider Last Rate Last Admin   acetaminophen (TYLENOL) tablet 650 mg  650 mg Oral Q6H PRN Jamillah Camilo T, MD       alum & mag hydroxide-simeth (MAALOX/MYLANTA) 200-200-20 MG/5ML suspension 30 mL  30  mL Oral Q4H PRN Aarya Robinson, [name], MD       feeding supplement (BOOST / RESOURCE BREEZE) liquid 1 Container  1 Container Oral QID Evamae Rowen, [name], MD   1 Container at 04/22/22 1647   haloperidol (HALDOL) tablet 5 mg  5 mg Oral BID Zaul Hubers T, MD   5 mg at 04/24/22 0802   Or   haloperidol lactate (HALDOL) injection 5 mg  5 mg Intramuscular BID Azana Kiesler, [name], MD       LORazepam (ATIVAN) tablet 1 mg  1 mg Oral BID Yocheved Depner T, MD   1 mg at 04/24/22 [id number]   Or   LORazepam (ATIVAN) injection 1 mg  1 mg Intramuscular BID Fortino Haag T, MD       magnesium hydroxide (MILK OF MAGNESIA) suspension 30 mL   30 mL Oral Daily PRN Cougar Imel T, MD       mirtazapine (REMERON) tablet 15 mg  15 mg Oral QHS Skeeter Sheard T, MD   15 mg at 04/21/22 2125   multivitamin with minerals tablet 1 tablet  1 tablet Oral Daily Charlisha Market, [name], MD   1 tablet at 04/24/22 0802   nicotine (NICODERM CQ - dosed in mg/24 hr) patch 7 mg  7 mg Transdermal Daily Sharai Overbay T, MD       OLANZapine (ZYPREXA) tablet 30 mg  30 mg Oral QHS Jamina Macbeth T, MD       thiamine (VITAMIN B1) tablet 100 mg  100 mg Oral Daily Bosco Paparella, [name], MD   100 mg at 04/24/22 [id number]    Lab Results: No results found for this or any previous visit (from the past 48 hour(s)).  Blood Alcohol level:  Lab Results  Component Value Date   ETH <10 03/29/2022   ETH <10 123XX123    Metabolic Disorder Labs: Lab Results  Component Value Date   HGBA1C 5.6 10/21/2021   MPG 114 10/21/2021   MPG 111 10/02/2021   Lab Results  Component Value Date   PROLACTIN 13.6 04/16/2015   Lab Results  Component Value Date   CHOL 189 10/21/2021   TRIG 52 10/21/2021   HDL 50 10/21/2021   CHOLHDL 3.8 10/21/2021   VLDL 10 10/21/2021   LDLCALC 129 (H) 10/21/2021   LDLCALC 93 10/01/2020    Physical Findings: AIMS: Facial and Oral Movements Muscles of Facial Expression: None, normal Lips and Perioral Area: None, normal Jaw: None, normal Tongue: None, normal,Extremity Movements Upper (arms, wrists, hands, fingers): None, normal Lower (legs, knees, ankles, toes): None, normal, Trunk Movements Neck, shoulders, hips: None, normal, Overall Severity Severity of abnormal movements (highest score from questions above): None, normal Incapacitation due to abnormal movements: None, normal Patient's awareness of abnormal movements (rate only patient's report): No Awareness, Dental Status Current problems with teeth and/or dentures?: No Does patient usually wear dentures?: No  CIWA:    COWS:     Musculoskeletal: Strength & Muscle Tone: within normal  limits Gait & Station: normal Patient leans: [name]  Psychiatric Specialty Exam:  Presentation  General Appearance:  Disheveled  Eye Contact: Fair  Speech: Pressured  Speech Volume: Increased  Handedness: Right   Mood and Affect  Mood: Irritable; Labile; Angry  Affect: Blunt; Labile   Thought Process  Thought Processes: Disorganized; Irrevelant  Descriptions of Associations:Loose  Orientation:Partial  Thought Content:Illogical; Rumination; Scattered; Tangential; Delusions  History of Schizophrenia/Schizoaffective disorder:Yes  Duration of Psychotic Symptoms:Greater than six months  Hallucinations:No data recorded Ideas of  Reference:Percusatory; Paranoia  Suicidal Thoughts:No data recorded Homicidal Thoughts:No data recorded  Sensorium  Memory: Immediate Fair; Recent Fair; Remote Fair  Judgment: Impaired  Insight: Poor; Lacking   Executive Functions  Concentration: Poor  Attention Span: Poor  Recall: Poor  Fund of Knowledge: Poor  Language: Poor   Psychomotor Activity  Psychomotor Activity:No data recorded  Assets  Assets: Physical Health; Resilience; Housing; Social Support   Sleep  Sleep:No data recorded   Physical Exam: Physical Exam Vitals and nursing note reviewed.  Constitutional:      Appearance: Normal appearance.  HENT:     Head: Normocephalic and atraumatic.     Mouth/Throat:     Pharynx: Oropharynx is clear.  Eyes:     Pupils: Pupils are equal, round, and reactive to light.  Cardiovascular:     Rate and Rhythm: Normal rate and regular rhythm.  Pulmonary:     Effort: Pulmonary effort is normal.     Breath sounds: Normal breath sounds.  Abdominal:     General: Abdomen is flat.     Palpations: Abdomen is soft.  Musculoskeletal:        General: Normal range of motion.  Skin:    General: Skin is warm and dry.  Neurological:     General: No focal deficit present.     Mental Status: She is alert.  Mental status is at baseline.  Psychiatric:        Attention and Perception: She is inattentive.        Mood and Affect: Mood normal. Affect is blunt.        Speech: Speech is tangential.        Thought Content: Thought content is delusional.    Review of Systems  Constitutional: Negative.   HENT: Negative.    Eyes: Negative.   Respiratory: Negative.    Cardiovascular: Negative.   Gastrointestinal: Negative.   Musculoskeletal: Negative.   Skin: Negative.   Neurological: Negative.   Psychiatric/Behavioral:  Negative for depression and suicidal ideas. The patient is nervous/anxious.    Blood pressure (!) 99/58, pulse (!) 56, temperature 98.6 F (37 C), temperature source Oral, resp. rate 18, height 5\' 4"  (1.626 m), weight 40.4 kg, SpO2 99 %. Body mass index is 15.28 kg/m.   Treatment Plan Summary: Plan if family is uncomfortable with her coming home we will have to reassess the whole plan.  Patient is on 2 different antipsychotics.  Appears to be compliant.  May need to reassess antipsychotic treatment.  She had had ECT in the past with perhaps partial response when she was catatonic but that does not seem to be the current condition.  I will discuss that with her.  Forming a treatment plan with the patient is difficult because of her complete lack of insight.  Alethia Berthold, MD 04/24/2022, 10:17 AM

## 2022-04-24 NOTE — Progress Notes (Signed)
Patient stated that she is not hungry and is not coming down for dinner.

## 2022-04-24 NOTE — Progress Notes (Signed)
Pt denies SI/HI/AVH and verbally agrees to approach staff if these become apparent or before harming themselves/others. Rates depression 0/10. Rates anxiety 0/10. Rates pain 0/10. Pt was supposed to be discharged but when SW spoke with guardian, she said that the pt cannot come back. Pt still states that those are not her family and her family are in space. Pt has been in her room all day in the dark. Pt has been eating. Scheduled medications administered to pt, per MD orders. RN provided support and encouragement to pt. Q15 min safety checks implemented and continued. Pt safe on the unit. RN will continue to monitor and intervene as needed.  Problem: Nutritional: Goal: Ability to achieve adequate nutritional intake will improve Outcome: Adequate for Discharge   Problem: Safety: Goal: Ability to redirect hostility and anger into socially appropriate behaviors will improve Outcome: Adequate for Discharge   04/24/22 0802  Psych Admission Type (Psych Patients Only)  Admission Status Involuntary  Psychosocial Assessment  Patient Complaints None  Eye Contact Brief  Facial Expression Flat  Affect Flat  Speech Logical/coherent  Interaction Isolative;Minimal  Motor Activity Slow  Appearance/Hygiene In scrubs  Behavior Characteristics Calm  Mood Sullen  Thought Process  Coherency WDL  Content WDL  Delusions None reported or observed  Perception Hallucinations  Hallucination None reported or observed  Judgment Limited  Confusion None  Danger to Self  Current suicidal ideation? Denies  Danger to Others  Danger to Others None reported or observed

## 2022-04-24 NOTE — Progress Notes (Signed)
Recreation Therapy Notes   Date: 04/24/2022  Time: 11:00 am   Location: Craft room    Behavioral response: Appropriate  Intervention Topic:  Stress Management    Discussion/Intervention:  Group content on today was focused on stress. The group defined stress and way to cope with stress. Participants expressed how they know when they are stresses out. Individuals described the different ways they have to cope with stress. The group stated reasons why it is important to cope with stress. Patient explained what good stress is and some examples. The group participated in the intervention "Stress Management". Individuals were separated into two group and answered questions related to stress. Clinical Observations/Feedback: Patient came to group and was focused on what peers and staff had to say about stress. Individual was social with peers and staff while participating in the intervention.    Malana Eberwein LRT/CTRS         Odile Veloso 04/24/2022 12:18 PM

## 2022-04-25 DIAGNOSIS — F251 Schizoaffective disorder, depressive type: Secondary | ICD-10-CM | POA: Diagnosis not present

## 2022-04-25 NOTE — BH IP Treatment Plan (Signed)
Interdisciplinary Treatment and Diagnostic Plan Update  04/25/2022 Time of Session: 10"00am [name] MRN: [id number]  Principal Diagnosis: Schizoaffective disorder, depressive type ([place])  Secondary Diagnoses: Principal Problem:   Schizoaffective disorder, depressive type ([place])   Current Medications:  Current Facility-Administered Medications  Medication Dose Route Frequency Provider Last Rate Last Admin   acetaminophen (TYLENOL) tablet 650 mg  650 mg Oral Q6H PRN [name], [name], MD       alum & mag hydroxide-simeth (MAALOX/MYLANTA) 200-200-20 MG/5ML suspension 30 mL  30 mL Oral Q4H PRN [name], [name], MD       feeding supplement (BOOST / RESOURCE BREEZE) liquid 1 Container  1 Container Oral QID [name], [name], MD   1 Container at 04/25/22 [phone]   haloperidol (HALDOL) tablet 5 mg  5 mg Oral BID [name], [name], MD   5 mg at 04/25/22 2993   Or   haloperidol lactate (HALDOL) injection 5 mg  5 mg Intramuscular BID [name], [name], MD       LORazepam (ATIVAN) tablet 1 mg  1 mg Oral BID [name], [name], MD   1 mg at 04/25/22 7169   Or   LORazepam (ATIVAN) injection 1 mg  1 mg Intramuscular BID [name], [name], MD       magnesium hydroxide (MILK OF MAGNESIA) suspension 30 mL  30 mL Oral Daily PRN [name], [name], MD       mirtazapine (REMERON) tablet 15 mg  15 mg Oral QHS [name], [name], MD   15 mg at 04/24/22 2132   multivitamin with minerals tablet 1 tablet  1 tablet Oral Daily [name], [name], MD   1 tablet at 04/25/22 6789   nicotine (NICODERM CQ - dosed in mg/24 hr) patch 7 mg  7 mg Transdermal Daily [name], [name], MD       OLANZapine (ZYPREXA) tablet 30 mg  30 mg Oral QHS [name], [name], MD   30 mg at 04/24/22 2132   thiamine (VITAMIN B1) tablet 100 mg  100 mg Oral Daily [name], [name], MD   100 mg at 04/25/22 3810   PTA Medications: Medications Prior to Admission  Medication Sig Dispense Refill Last Dose   clonazePAM (KLONOPIN) 0.5 MG tablet Take 0.5 tablets (0.25 mg total)  by mouth 2 (two) times daily. (Patient not taking: Reported on 03/30/2022) 60 tablet 0    haloperidol (HALDOL) 5 MG tablet Take 1 tablet (5 mg total) by mouth 2 (two) times daily. (Patient not taking: Reported on 03/30/2022) 60 tablet 3    midodrine (PROAMATINE) 2.5 MG tablet Take 1 tablet (2.5 mg total) by mouth 3 (three) times daily with meals. (Patient not taking: Reported on 03/30/2022)  0    mirtazapine (REMERON) 15 MG tablet Take 0.5 tablets (7.5 mg total) by mouth at bedtime. (Patient not taking: Reported on 03/30/2022) 30 tablet 2    Multiple Vitamin (MULTIVITAMIN WITH MINERALS) TABS tablet Take 1 tablet by mouth daily. (Patient not taking: Reported on 03/30/2022)      OLANZapine (ZYPREXA) 5 MG tablet Take 1 tablet (5 mg total) by mouth at bedtime. (Patient not taking: Reported on 03/30/2022) 30 tablet 2    thiamine 100 MG tablet Take 1 tablet (100 mg total) by mouth daily. (Patient not taking: Reported on 03/30/2022)       Patient Stressors: Marital or family conflict    Patient Strengths: Supportive family/friends   Treatment Modalities: Medication Management, Group therapy, Case management,  1 to  1 session with clinician, Psychoeducation, Recreational therapy.   Physician Treatment Plan for Primary Diagnosis: Schizoaffective disorder, depressive type (HCC) Long Term Goal(s): Improvement in symptoms so as ready for discharge   Short Term Goals: Compliance with prescribed medications will improve Ability to verbalize feelings will improve Ability to demonstrate self-control will improve Ability to identify and develop effective coping behaviors will improve  Medication Management: Evaluate patient's response, side effects, and tolerance of medication regimen.  Therapeutic Interventions: 1 to 1 sessions, Unit Group sessions and Medication administration.  Evaluation of Outcomes: Progressing  Physician Treatment Plan for Secondary Diagnosis: Principal Problem:   Schizoaffective  disorder, depressive type (HCC)  Long Term Goal(s): Improvement in symptoms so as ready for discharge   Short Term Goals: Compliance with prescribed medications will improve Ability to verbalize feelings will improve Ability to demonstrate self-control will improve Ability to identify and develop effective coping behaviors will improve     Medication Management: Evaluate patient's response, side effects, and tolerance of medication regimen.  Therapeutic Interventions: 1 to 1 sessions, Unit Group sessions and Medication administration.  Evaluation of Outcomes: Progressing   RN Treatment Plan for Primary Diagnosis: Schizoaffective disorder, depressive type (HCC) Long Term Goal(s): Knowledge of disease and therapeutic regimen to maintain health will improve  Short Term Goals: Ability to remain free from injury will improve, Ability to verbalize frustration and anger appropriately will improve, Ability to demonstrate self-control, Ability to identify and develop effective coping behaviors will improve, and Compliance with prescribed medications will improve  Medication Management: RN will administer medications as ordered by provider, will assess and evaluate patient's response and provide education to patient for prescribed medication. RN will report any adverse and/or side effects to prescribing provider.  Therapeutic Interventions: 1 on 1 counseling sessions, Psychoeducation, Medication administration, Evaluate responses to treatment, Monitor vital signs and CBGs as ordered, Perform/monitor CIWA, COWS, AIMS and Fall Risk screenings as ordered, Perform wound care treatments as ordered.  Evaluation of Outcomes: Progressing   LCSW Treatment Plan for Primary Diagnosis: Schizoaffective disorder, depressive type (HCC) Long Term Goal(s): Safe transition to appropriate next level of care at discharge, Engage patient in therapeutic group addressing interpersonal concerns.  Short Term Goals:  Engage patient in aftercare planning with referrals and resources, Increase social support, Increase ability to appropriately verbalize feelings, Increase emotional regulation, Identify triggers associated with mental health/substance abuse issues, and Increase skills for wellness and recovery  Therapeutic Interventions: Assess for all discharge needs, 1 to 1 time with Social worker, Explore available resources and support systems, Assess for adequacy in community support network, Educate family and significant other(s) on suicide prevention, Complete Psychosocial Assessment, Interpersonal group therapy.  Evaluation of Outcomes: Progressing   Progress in Treatment: Attending groups: Yes. Participating in groups: No. Taking medication as prescribed: Yes. Toleration medication: Yes. Family/Significant other contact made: Yes, individual(s) contacted:  guardian/sister, [name] Pintor-Lennon. Patient understands diagnosis: No. Discussing patient identified problems/goals with staff: No. Medical problems stabilized or resolved: Yes. Denies suicidal/homicidal ideation: Yes. Issues/concerns per patient self-inventory: No. Other: none.   New problem(s) identified: No, Describe:  none identified. Update 04/15/22: No changes at this time. Update 04/20/22: No changes at this time. Update 04/25/22: No changes at this time.   New Short Term/Long Term Goal(s): elimination of symptoms of psychosis, medication management for mood stabilization; elimination of SI thoughts; development of comprehensive mental wellness plan. Update 04/15/22: No changes at this time. Update 04/20/22: No changes at this time. Update 04/25/22: No changes at this time.  Patient Goals:  "Nothing I want to work on. I want to be discharged." Update 04/15/22: No changes at this time. Update 04/20/22: No changes at this time. Update 04/25/22: No changes at this time.   Discharge Plan or Barriers: CSW will assist pt with development of an  appropriate aftercare/discharge plan. Update 04/15/22: Pt has been unwilling to engage with the treatment team around discharge. Update 04/20/22: No changes at this time.   Update 04/25/22: Patient will be returning to live with her sister. Pt will receive outpatient services through [facility].    Reason for Continuation of Hospitalization: Aggression Medication stabilization   Estimated Length of Stay: 1-7 days Update 04/15/22: No changes at this time. Update 04/20/22: TBD Update 04/25/22: TBD   Last 3 [place] Suicide Severity Risk Score: Flowsheet Row Admission (Current) from 04/08/2022 in [facility] INPATIENT BEHAVIORAL MEDICINE Admission (Discharged) from 10/17/2021 in [name] BE[facility]AVIOR[facility] Admission (Discharged) from 10/02/2021 in [facility] REGIONAL CARDIAC MED PCU  C-SSRS RISK CATEGORY No Risk No Risk No Risk       Last PHQ 2/9 Scores:     No data to display          Scribe for Treatment Team: Otelia Santee, LCSW 04/25/2022 10:06 AM

## 2022-04-25 NOTE — Plan of Care (Signed)
Patient cooperative but guarded on rounds.  Patient is isolative to room.  Patient is medication compliant with much encouragement.  Patient Patient shows signs and symptoms of anxiety and depression.  Patient is disheveled and has a strong body odor.  Patient appears to be responding to stimuli.  No s/s of any discomfort noted.  Patient can contract for safety, states "I wouldn't hurt myself".  Q 15 minute rounds in progress, will continue to monitor.  Problem: Activity: Goal: Will verbalize the importance of balancing activity with adequate rest periods Outcome: Not Progressing   Problem: Education: Goal: Will be free of psychotic symptoms Outcome: Not Progressing Goal: Knowledge of the prescribed therapeutic regimen will improve Outcome: Not Progressing   Problem: Coping: Goal: Coping ability will improve Outcome: Not Progressing

## 2022-04-25 NOTE — Progress Notes (Signed)
[facility] MD Progress Note  04/25/2022 1:43 PM [name]  MRN:  [id number] Subjective: Follow-up 61 year old woman with schizophrenia.  Continues to be delusional about her family.  Stays pretty isolated to her room.  She is taking her medicine.  No evidence side effects. Principal Problem: Schizoaffective disorder, depressive type ([place]) Diagnosis: Principal Problem:   Schizoaffective disorder, depressive type ([place])  Total Time spent with patient: 30 minutes  Past Psychiatric History: Past history of recurrent psychotic disorder  Past Medical History:  Past Medical History:  Diagnosis Date   Anemia 10/02/2021   Non compliance w medication regimen    Schizophrenia [facility])     Past Surgical History:  Procedure Laterality Date   BIOPSY  09/25/2021   Procedure: BIOPSY;  Surgeon: Milus Banister, MD;  Location: [facility];  Service: Gastroenterology;;   ESOPHAGOGASTRODUODENOSCOPY (EGD) WITH PROPOFOL [name] 09/25/2021   Procedure: ESOPHAGOGASTRODUODENOSCOPY (EGD) WITH PROPOFOL;  Surgeon: Milus Banister, MD;  Location: [facility];  Service: Gastroenterology;  Laterality: [name];  With NGT placement   Family History: History reviewed. No pertinent family history. Family Psychiatric  History: See previous Social History:  Social History   Substance and Sexual Activity  Alcohol Use Not Currently   Comment: Refuses to disclose how much     Social History   Substance and Sexual Activity  Drug Use No   Comment: Refuses to answer    Social History   Socioeconomic History   Marital status: Single    Spouse name: Not on file   Number of children: Not on file   Years of education: Not on file   Highest education level: Not on file  Occupational History   Not on file  Tobacco Use   Smoking status: Every Day    Packs/day: 2.00    Types: Cigarettes   Smokeless tobacco: Never  Substance and Sexual Activity   Alcohol use: Not Currently    Comment: Refuses to disclose how much   Drug use:  No    Comment: Refuses to answer   Sexual activity: Not Currently    Comment: refused to answer  Other Topics Concern   Not on file  Social History Narrative   Not on file   Social Determinants of Health   Financial Resource Strain: Not on file  Food Insecurity: No Food Insecurity (04/08/2022)   Hunger Vital Sign    Worried About Running Out of Food in the Last Year: Never true    Ran Out of Food in the Last Year: Never true  Transportation Needs: No Transportation Needs (04/08/2022)   PRAPARE - Hydrologist (Medical): No    Lack of Transportation (Non-Medical): No  Physical Activity: Not on file  Stress: Not on file  Social Connections: Not on file   Additional Social History:                         Sleep: Fair  Appetite:  Fair  Current Medications: Current Facility-Administered Medications  Medication Dose Route Frequency Provider Last Rate Last Admin   acetaminophen (TYLENOL) tablet 650 mg  650 mg Oral Q6H PRN Kaedon Fanelli T, MD       alum & mag hydroxide-simeth (MAALOX/MYLANTA) 200-200-20 MG/5ML suspension 30 mL  30 mL Oral Q4H PRN Kawana Hegel, [name], MD       feeding supplement (BOOST / RESOURCE BREEZE) liquid 1 Container  1 Container Oral QID Kattia Selley, [name], MD   1  Container at 04/25/22 0814   haloperidol (HALDOL) tablet 5 mg  5 mg Oral BID Jermie Hippe, [name], MD   5 mg at 04/25/22 5188   Or   haloperidol lactate (HALDOL) injection 5 mg  5 mg Intramuscular BID Amelya Mabry, [name], MD       LORazepam (ATIVAN) tablet 1 mg  1 mg Oral BID Stein Windhorst T, MD   1 mg at 04/25/22 4166   Or   LORazepam (ATIVAN) injection 1 mg  1 mg Intramuscular BID Jasiah Elsen T, MD       magnesium hydroxide (MILK OF MAGNESIA) suspension 30 mL  30 mL Oral Daily PRN Josanna Hefel T, MD       mirtazapine (REMERON) tablet 15 mg  15 mg Oral QHS Lesia Monica T, MD   15 mg at 04/24/22 2132   multivitamin with minerals tablet 1 tablet  1 tablet Oral Daily Khrystal Jeanmarie,  [name], MD   1 tablet at 04/25/22 0630   nicotine (NICODERM CQ - dosed in mg/24 hr) patch 7 mg  7 mg Transdermal Daily Kinsley Holderman T, MD       OLANZapine (ZYPREXA) tablet 30 mg  30 mg Oral QHS Donyea Beverlin T, MD   30 mg at 04/24/22 2132   thiamine (VITAMIN B1) tablet 100 mg  100 mg Oral Daily Yvana Samonte, [name], MD   100 mg at 04/25/22 1601    Lab Results: No results found for this or any previous visit (from the past 48 hour(s)).  Blood Alcohol level:  Lab Results  Component Value Date   ETH <10 03/29/2022   ETH <10 09/12/2021    Metabolic Disorder Labs: Lab Results  Component Value Date   HGBA1C 5.6 10/21/2021   MPG 114 10/21/2021   MPG 111 10/02/2021   Lab Results  Component Value Date   PROLACTIN 13.6 04/16/2015   Lab Results  Component Value Date   CHOL 189 10/21/2021   TRIG 52 10/21/2021   HDL 50 10/21/2021   CHOLHDL 3.8 10/21/2021   VLDL 10 10/21/2021   LDLCALC 129 (H) 10/21/2021   LDLCALC 93 10/01/2020    Physical Findings: AIMS: Facial and Oral Movements Muscles of Facial Expression: None, normal Lips and Perioral Area: None, normal Jaw: None, normal Tongue: None, normal,Extremity Movements Upper (arms, wrists, hands, fingers): None, normal Lower (legs, knees, ankles, toes): None, normal, Trunk Movements Neck, shoulders, hips: None, normal, Overall Severity Severity of abnormal movements (highest score from questions above): None, normal Incapacitation due to abnormal movements: None, normal Patient's awareness of abnormal movements (rate only patient's report): No Awareness, Dental Status Current problems with teeth and/or dentures?: No Does patient usually wear dentures?: No  CIWA:    COWS:     Musculoskeletal: Strength & Muscle Tone: within normal limits Gait & Station: normal Patient leans: [name]  Psychiatric Specialty Exam:  Presentation  General Appearance:  Disheveled  Eye Contact: Fair  Speech: Pressured  Speech  Volume: Increased  Handedness: Right   Mood and Affect  Mood: Irritable; Labile; Angry  Affect: Blunt; Labile   Thought Process  Thought Processes: Disorganized; Irrevelant  Descriptions of Associations:Loose  Orientation:Partial  Thought Content:Illogical; Rumination; Scattered; Tangential; Delusions  History of Schizophrenia/Schizoaffective disorder:Yes  Duration of Psychotic Symptoms:Greater than six months  Hallucinations:No data recorded Ideas of Reference:Percusatory; Paranoia  Suicidal Thoughts:No data recorded Homicidal Thoughts:No data recorded  Sensorium  Memory: Immediate Fair; Recent Fair; Remote Fair  Judgment: Impaired  Insight: Poor; Lacking   Art therapist  Concentration: Poor  Attention Span: Poor  Recall: Poor  Fund of Knowledge: Poor  Language: Poor   Psychomotor Activity  Psychomotor Activity:No data recorded  Assets  Assets: Physical Health; Resilience; Housing; Social Support   Sleep  Sleep:No data recorded   Physical Exam: Physical Exam Vitals reviewed.  Constitutional:      Appearance: Normal appearance.  HENT:     Head: Normocephalic and atraumatic.     Mouth/Throat:     Pharynx: Oropharynx is clear.  Eyes:     Pupils: Pupils are equal, round, and reactive to light.  Cardiovascular:     Rate and Rhythm: Normal rate and regular rhythm.  Pulmonary:     Effort: Pulmonary effort is normal.     Breath sounds: Normal breath sounds.  Abdominal:     General: Abdomen is flat.     Palpations: Abdomen is soft.  Musculoskeletal:        General: Normal range of motion.  Skin:    General: Skin is warm and dry.  Neurological:     General: No focal deficit present.     Mental Status: She is alert. Mental status is at baseline.  Psychiatric:        Attention and Perception: She is inattentive.        Mood and Affect: Mood normal. Affect is blunt.        Speech: She is noncommunicative.         Behavior: Behavior is slowed.        Thought Content: Thought content is paranoid and delusional.    Review of Systems  Constitutional: Negative.   HENT: Negative.    Eyes: Negative.   Respiratory: Negative.    Cardiovascular: Negative.   Gastrointestinal: Negative.   Musculoskeletal: Negative.   Skin: Negative.   Neurological: Negative.   Psychiatric/Behavioral: Negative.     Blood pressure 113/66, pulse 63, temperature 98.6 F (37 C), temperature source Oral, resp. rate 18, height 5\' 4"  (1.626 m), weight 40.4 kg, SpO2 97 %. Body mass index is 15.28 kg/m.   Treatment Plan Summary: Plan no change to medication.  Encouragement and supportive counseling.  Sounds like we may be able to reconsider the discharge plan for Monday  Thursday, MD 04/25/2022, 1:43 PM

## 2022-04-25 NOTE — Progress Notes (Signed)
Pt denies SI/HI/AVH and verbally agrees to approach staff if these become apparent or before harming themselves/others. Rates depression 0/10. Rates anxiety 0/10. Rates pain 0/10. Pt has been in her room all day but comes out for meals. Scheduled medications administered to pt, per MD orders. RN provided support and encouragement to pt. Q15 min safety checks implemented and continued. Pt safe on the unit. RN will continue to monitor and intervene as needed.  Problem: Coping: Goal: Level of anxiety will decrease Outcome: Progressing   Problem: Health Behavior/Discharge Planning: Goal: Compliance with prescribed medication regimen will improve Outcome: Progressing    04/25/22 0814  Psych Admission Type (Psych Patients Only)  Admission Status Involuntary  Psychosocial Assessment  Patient Complaints None  Eye Contact Avoids  Facial Expression Flat  Affect Flat  Speech Logical/coherent;Soft  Interaction Guarded;Forwards little;Isolative  Motor Activity Slow  Appearance/Hygiene Unremarkable;In scrubs  Behavior Characteristics Cooperative;Appropriate to situation;Calm  Mood Sad;Sullen  Thought Process  Coherency WDL  Content WDL  Delusions None reported or observed  Perception Hallucinations  Hallucination None reported or observed  Judgment Limited  Confusion None  Danger to Self  Current suicidal ideation? Denies  Danger to Others  Danger to Others None reported or observed

## 2022-04-26 DIAGNOSIS — F251 Schizoaffective disorder, depressive type: Secondary | ICD-10-CM | POA: Diagnosis not present

## 2022-04-26 NOTE — [facility] Group Notes (Signed)
[facility] Group Notes:  (Nursing/MHT/Case Management/Adjunct)  Date:  04/26/2022  Time:  8:25 PM  Type of Therapy:   Wrap up  Participation Level:  Active  Participation Quality:  Appropriate  Affect:  Appropriate  Cognitive:  Alert  Insight:  Good  Engagement in Group:  Engaged and Want to be Discharge  Modes of Intervention:  Support  Summary of Progress/Problems:  [name] 04/26/2022, 8:25 PM

## 2022-04-26 NOTE — Plan of Care (Signed)
D: Patient alert and oriented. Patient denies pain. Patient denies anxiety and depression. Patient denies SI/HI/AVH. Patient isolative to room during shift with exception to coming out for meals and medication.  A: Scheduled medications administered to patient, per MD orders.  Support and encouragement provided to patient.  Q15 minute safety checks maintained.   R: Patient compliant with medication administration and treatment plan. No adverse drug reactions noted. Patient remains safe on the unit at this time.  Problem: Education: Goal: Knowledge of [place] [facility] Education information/materials will improve Outcome: Progressing Goal: Verbalization of understanding the information provided will improve Outcome: Progressing   Problem: Health Behavior/Discharge Planning: Goal: Compliance with treatment plan for underlying cause of condition will improve Outcome: Progressing   Problem: Safety: Goal: Periods of time without injury will increase Outcome: Progressing

## 2022-04-26 NOTE — Progress Notes (Signed)
[facility] MD Progress Note  04/26/2022 11:10 AM [name]  MRN:  [id number] Subjective: Patient seen and chart reviewed.  61 year old woman schizoaffective disorder.  No new complaints.  Still gets agitated if discharge is discussed when that requires discussing the rest of her family.  Not threatening any violence.  Compliant with medicine Principal Problem: Schizoaffective disorder, depressive type (HCC) Diagnosis: Principal Problem:   Schizoaffective disorder, depressive type (HCC)  Total Time spent with patient: 30 minutes  Past Psychiatric History: Past history of psychotic disorder  Past Medical History:  Past Medical History:  Diagnosis Date   Anemia 10/02/2021   Non compliance w medication regimen    Schizophrenia [facility])     Past Surgical History:  Procedure Laterality Date   BIOPSY  09/25/2021   Procedure: BIOPSY;  Surgeon: Rachael Fee, MD;  Location: [facility];  Service: Gastroenterology;;   ESOPHAGOGASTRODUODENOSCOPY (EGD) WITH PROPOFOL [name] 09/25/2021   Procedure: ESOPHAGOGASTRODUODENOSCOPY (EGD) WITH PROPOFOL;  Surgeon: Rachael Fee, MD;  Location: [facility];  Service: Gastroenterology;  Laterality: [name];  With NGT placement   Family History: History reviewed. No pertinent family history. Family Psychiatric  History: See previous Social History:  Social History   Substance and Sexual Activity  Alcohol Use Not Currently   Comment: Refuses to disclose how much     Social History   Substance and Sexual Activity  Drug Use No   Comment: Refuses to answer    Social History   Socioeconomic History   Marital status: Single    Spouse name: Not on file   Number of children: Not on file   Years of education: Not on file   Highest education level: Not on file  Occupational History   Not on file  Tobacco Use   Smoking status: Every Day    Packs/day: 2.00    Types: Cigarettes   Smokeless tobacco: Never  Substance and Sexual Activity   Alcohol use: Not  Currently    Comment: Refuses to disclose how much   Drug use: No    Comment: Refuses to answer   Sexual activity: Not Currently    Comment: refused to answer  Other Topics Concern   Not on file  Social History Narrative   Not on file   Social Determinants of Health   Financial Resource Strain: Not on file  Food Insecurity: No Food Insecurity (04/08/2022)   Hunger Vital Sign    Worried About Running Out of Food in the Last Year: Never true    Ran Out of Food in the Last Year: Never true  Transportation Needs: No Transportation Needs (04/08/2022)   PRAPARE - Administrator, Civil Service (Medical): No    Lack of Transportation (Non-Medical): No  Physical Activity: Not on file  Stress: Not on file  Social Connections: Not on file   Additional Social History:                         Sleep: Fair  Appetite:  Fair  Current Medications: Current Facility-Administered Medications  Medication Dose Route Frequency Provider Last Rate Last Admin   acetaminophen (TYLENOL) tablet 650 mg  650 mg Oral Q6H PRN Bailey Faiella T, MD       alum & mag hydroxide-simeth (MAALOX/MYLANTA) 200-200-20 MG/5ML suspension 30 mL  30 mL Oral Q4H PRN Angie Hogg, [name], MD       feeding supplement (BOOST / RESOURCE BREEZE) liquid 1 Container  1 Container Oral  QID Raizy Auzenne, [name], MD   1 Container at 04/26/22 [phone]   haloperidol (HALDOL) tablet 5 mg  5 mg Oral BID Othell Diluzio, [name], MD   5 mg at 04/26/22 3810   Or   haloperidol lactate (HALDOL) injection 5 mg  5 mg Intramuscular BID Khadeeja Elden, [name], MD       LORazepam (ATIVAN) tablet 1 mg  1 mg Oral BID Cason Luffman T, MD   1 mg at 04/26/22 1751   Or   LORazepam (ATIVAN) injection 1 mg  1 mg Intramuscular BID Lilymae Swiech T, MD       magnesium hydroxide (MILK OF MAGNESIA) suspension 30 mL  30 mL Oral Daily PRN Neomi Laidler T, MD       mirtazapine (REMERON) tablet 15 mg  15 mg Oral QHS Koury Roddy T, MD   15 mg at 04/25/22 2138    multivitamin with minerals tablet 1 tablet  1 tablet Oral Daily Presten Joost, [name], MD   1 tablet at 04/26/22 0258   nicotine (NICODERM CQ - dosed in mg/24 hr) patch 7 mg  7 mg Transdermal Daily Falon Huesca T, MD       OLANZapine (ZYPREXA) tablet 30 mg  30 mg Oral QHS Calin Ellery T, MD   30 mg at 04/25/22 2137   thiamine (VITAMIN B1) tablet 100 mg  100 mg Oral Daily Candyce Gambino, [name], MD   100 mg at 04/26/22 5277    Lab Results: No results found for this or any previous visit (from the past 48 hour(s)).  Blood Alcohol level:  Lab Results  Component Value Date   ETH <10 03/29/2022   ETH <10 82/42/3536    Metabolic Disorder Labs: Lab Results  Component Value Date   HGBA1C 5.6 10/21/2021   MPG 114 10/21/2021   MPG 111 10/02/2021   Lab Results  Component Value Date   PROLACTIN 13.6 04/16/2015   Lab Results  Component Value Date   CHOL 189 10/21/2021   TRIG 52 10/21/2021   HDL 50 10/21/2021   CHOLHDL 3.8 10/21/2021   VLDL 10 10/21/2021   LDLCALC 129 (H) 10/21/2021   LDLCALC 93 10/01/2020    Physical Findings: AIMS: Facial and Oral Movements Muscles of Facial Expression: None, normal Lips and Perioral Area: None, normal Jaw: None, normal Tongue: None, normal,Extremity Movements Upper (arms, wrists, hands, fingers): None, normal Lower (legs, knees, ankles, toes): None, normal, Trunk Movements Neck, shoulders, hips: None, normal, Overall Severity Severity of abnormal movements (highest score from questions above): None, normal Incapacitation due to abnormal movements: None, normal Patient's awareness of abnormal movements (rate only patient's report): No Awareness, Dental Status Current problems with teeth and/or dentures?: No Does patient usually wear dentures?: No  CIWA:    COWS:     Musculoskeletal: Strength & Muscle Tone: within normal limits Gait & Station: normal Patient leans: [name]  Psychiatric Specialty Exam:  Presentation  General Appearance:   Disheveled  Eye Contact: Fair  Speech: Pressured  Speech Volume: Increased  Handedness: Right   Mood and Affect  Mood: Irritable; Labile; Angry  Affect: Blunt; Labile   Thought Process  Thought Processes: Disorganized; Irrevelant  Descriptions of Associations:Loose  Orientation:Partial  Thought Content:Illogical; Rumination; Scattered; Tangential; Delusions  History of Schizophrenia/Schizoaffective disorder:Yes  Duration of Psychotic Symptoms:Greater than six months  Hallucinations:No data recorded Ideas of Reference:Percusatory; Paranoia  Suicidal Thoughts:No data recorded Homicidal Thoughts:No data recorded  Sensorium  Memory: Immediate Fair; Recent Fair; Remote Fair  Judgment: Impaired  Insight: Poor; Lacking   Executive Functions  Concentration: Poor  Attention Span: Poor  Recall: Poor  Fund of Knowledge: Poor  Language: Poor   Psychomotor Activity  Psychomotor Activity:No data recorded  Assets  Assets: Physical Health; Resilience; Housing; Social Support   Sleep  Sleep:No data recorded   Physical Exam: Physical Exam Vitals and nursing note reviewed.  Constitutional:      Appearance: Normal appearance.  HENT:     Head: Normocephalic and atraumatic.     Mouth/Throat:     Pharynx: Oropharynx is clear.  Eyes:     Pupils: Pupils are equal, round, and reactive to light.  Cardiovascular:     Rate and Rhythm: Normal rate and regular rhythm.  Pulmonary:     Effort: Pulmonary effort is normal.     Breath sounds: Normal breath sounds.  Abdominal:     General: Abdomen is flat.     Palpations: Abdomen is soft.  Musculoskeletal:        General: Normal range of motion.  Skin:    General: Skin is warm and dry.  Neurological:     General: No focal deficit present.     Mental Status: She is alert. Mental status is at baseline.  Psychiatric:        Attention and Perception: Attention normal.        Mood and Affect:  Affect is inappropriate.        Speech: Speech is tangential.        Behavior: Behavior is agitated. Behavior is not aggressive.        Thought Content: Thought content is delusional.    Review of Systems  Constitutional: Negative.   HENT: Negative.    Eyes: Negative.   Respiratory: Negative.    Cardiovascular: Negative.   Gastrointestinal: Negative.   Musculoskeletal: Negative.   Skin: Negative.   Neurological: Negative.   Psychiatric/Behavioral:  The patient is nervous/anxious.    Blood pressure (!) 103/57, pulse (!) 49, temperature 98.2 F (36.8 C), temperature source Oral, resp. rate 18, height 5\' 4"  (1.626 m), weight 40.4 kg, SpO2 100 %. Body mass index is 15.28 kg/m.   Treatment Plan Summary: Plan family had reported they might be willing to reconsider discharge on Monday.  Preparations are still underway if that will work out.  Friday, MD 04/26/2022, 11:10 AM

## 2022-04-27 DIAGNOSIS — F251 Schizoaffective disorder, depressive type: Secondary | ICD-10-CM | POA: Diagnosis not present

## 2022-04-27 MED ORDER — HALOPERIDOL LACTATE 5 MG/ML IJ SOLN: 5.0000 mg | Freq: Two times a day (BID) | INTRAMUSCULAR | Status: DC

## 2022-04-27 MED ORDER — HALOPERIDOL 5 MG PO TABS
10.0000 mg | ORAL_TABLET | Freq: Two times a day (BID) | ORAL | Status: DC
  Administered 2022-04-27 – 2022-05-27 (×60): 10 mg via ORAL
  Filled 2022-04-27 (×60): qty 2

## 2022-04-27 NOTE — Progress Notes (Signed)
[facility] MD Progress Note  04/27/2022 3:15 PM [name]  MRN:  [id number] Subjective: Follow-up this 61 year old woman with schizoaffective disorder.  She remains delusional and disorganized and continues to insist that her family does not exist.  Uncooperative with any conversation.  We contacted her sister today who is still not feeling safe with the discharge plan Principal Problem: Schizoaffective disorder, depressive type ([place]) Diagnosis: Principal Problem:   Schizoaffective disorder, depressive type ([place])  Total Time spent with patient: 30 minutes  Past Psychiatric History: Past history of schizoaffective disorder  Past Medical History:  Past Medical History:  Diagnosis Date   Anemia 10/02/2021   Non compliance w medication regimen    Schizophrenia ([place])     Past Surgical History:  Procedure Laterality Date   BIOPSY  09/25/2021   Procedure: BIOPSY;  Surgeon: Milus Banister, MD;  Location: [facility];  Service: Gastroenterology;;   ESOPHAGOGASTRODUODENOSCOPY (EGD) WITH PROPOFOL [name] 09/25/2021   Procedure: ESOPHAGOGASTRODUODENOSCOPY (EGD) WITH PROPOFOL;  Surgeon: Milus Banister, MD;  Location: [facility];  Service: Gastroenterology;  Laterality: [name];  With NGT placement   Family History: History reviewed. No pertinent family history. Family Psychiatric  History: See previous Social History:  Social History   Substance and Sexual Activity  Alcohol Use Not Currently   Comment: Refuses to disclose how much     Social History   Substance and Sexual Activity  Drug Use No   Comment: Refuses to answer    Social History   Socioeconomic History   Marital status: Single    Spouse name: Not on file   Number of children: Not on file   Years of education: Not on file   Highest education level: Not on file  Occupational History   Not on file  Tobacco Use   Smoking status: Every Day    Packs/day: 2.00    Types: Cigarettes   Smokeless tobacco: Never  Substance and Sexual  Activity   Alcohol use: Not Currently    Comment: Refuses to disclose how much   Drug use: No    Comment: Refuses to answer   Sexual activity: Not Currently    Comment: refused to answer  Other Topics Concern   Not on file  Social History Narrative   Not on file   Social Determinants of Health   Financial Resource Strain: Not on file  Food Insecurity: No Food Insecurity (04/08/2022)   Hunger Vital Sign    Worried About Running Out of Food in the Last Year: Never true    Ran Out of Food in the Last Year: Never true  Transportation Needs: No Transportation Needs (04/08/2022)   PRAPARE - Hydrologist (Medical): No    Lack of Transportation (Non-Medical): No  Physical Activity: Not on file  Stress: Not on file  Social Connections: Not on file   Additional Social History:                         Sleep: Fair  Appetite:  Fair  Current Medications: Current Facility-Administered Medications  Medication Dose Route Frequency Provider Last Rate Last Admin   acetaminophen (TYLENOL) tablet 650 mg  650 mg Oral Q6H PRN Roth Ress T, MD       alum & mag hydroxide-simeth (MAALOX/MYLANTA) 200-200-20 MG/5ML suspension 30 mL  30 mL Oral Q4H PRN Uzziel Russey, [name], MD       feeding supplement (BOOST / RESOURCE BREEZE) liquid 1 Container  1 Container Oral QID Kween Bacorn, [name], MD   1 Container at 04/26/22 [phone]   haloperidol (HALDOL) tablet 5 mg  5 mg Oral BID Dalaina Tates, [name], MD   5 mg at 04/27/22 3235   Or   haloperidol lactate (HALDOL) injection 5 mg  5 mg Intramuscular BID Warwick Nick, [name], MD       LORazepam (ATIVAN) tablet 1 mg  1 mg Oral BID Benelli Winther T, MD   1 mg at 04/27/22 5732   Or   LORazepam (ATIVAN) injection 1 mg  1 mg Intramuscular BID Elodie Panameno T, MD       magnesium hydroxide (MILK OF MAGNESIA) suspension 30 mL  30 mL Oral Daily PRN Kwanza Cancelliere T, MD       mirtazapine (REMERON) tablet 15 mg  15 mg Oral QHS Hersh Minney T, MD   15 mg  at 04/26/22 2132   multivitamin with minerals tablet 1 tablet  1 tablet Oral Daily Afrika Brick, [name], MD   1 tablet at 04/27/22 2025   nicotine (NICODERM CQ - dosed in mg/24 hr) patch 7 mg  7 mg Transdermal Daily Dustine Bertini T, MD       OLANZapine (ZYPREXA) tablet 30 mg  30 mg Oral QHS Sebastiano Luecke T, MD   30 mg at 04/26/22 2132   thiamine (VITAMIN B1) tablet 100 mg  100 mg Oral Daily Glen Blatchley, [name], MD   100 mg at 04/27/22 4270    Lab Results: No results found for this or any previous visit (from the past 48 hour(s)).  Blood Alcohol level:  Lab Results  Component Value Date   ETH <10 03/29/2022   ETH <10 09/12/2021    Metabolic Disorder Labs: Lab Results  Component Value Date   HGBA1C 5.6 10/21/2021   MPG 114 10/21/2021   MPG 111 10/02/2021   Lab Results  Component Value Date   PROLACTIN 13.6 04/16/2015   Lab Results  Component Value Date   CHOL 189 10/21/2021   TRIG 52 10/21/2021   HDL 50 10/21/2021   CHOLHDL 3.8 10/21/2021   VLDL 10 10/21/2021   LDLCALC 129 (H) 10/21/2021   LDLCALC 93 10/01/2020    Physical Findings: AIMS: Facial and Oral Movements Muscles of Facial Expression: None, normal Lips and Perioral Area: None, normal Jaw: None, normal Tongue: None, normal,Extremity Movements Upper (arms, wrists, hands, fingers): None, normal Lower (legs, knees, ankles, toes): None, normal, Trunk Movements Neck, shoulders, hips: None, normal, Overall Severity Severity of abnormal movements (highest score from questions above): None, normal Incapacitation due to abnormal movements: None, normal Patient's awareness of abnormal movements (rate only patient's report): No Awareness, Dental Status Current problems with teeth and/or dentures?: No Does patient usually wear dentures?: No  CIWA:    COWS:     Musculoskeletal: Strength & Muscle Tone: within normal limits Gait & Station: normal Patient leans: [name]  Psychiatric Specialty Exam:  Presentation  General  Appearance:  Disheveled  Eye Contact: Fair  Speech: Pressured  Speech Volume: Increased  Handedness: Right   Mood and Affect  Mood: Irritable; Labile; Angry  Affect: Blunt; Labile   Thought Process  Thought Processes: Disorganized; Irrevelant  Descriptions of Associations:Loose  Orientation:Partial  Thought Content:Illogical; Rumination; Scattered; Tangential; Delusions  History of Schizophrenia/Schizoaffective disorder:Yes  Duration of Psychotic Symptoms:Greater than six months  Hallucinations:No data recorded Ideas of Reference:Percusatory; Paranoia  Suicidal Thoughts:No data recorded Homicidal Thoughts:No data recorded  Sensorium  Memory: Immediate Fair; Recent Fair; Remote Fair  Judgment: Impaired  Insight: Poor; Lacking   Executive Functions  Concentration: Poor  Attention Span: Poor  Recall: Poor  Fund of Knowledge: Poor  Language: Poor   Psychomotor Activity  Psychomotor Activity:No data recorded  Assets  Assets: Physical Health; Resilience; Housing; Social Support   Sleep  Sleep:No data recorded   Physical Exam: Physical Exam Vitals and nursing note reviewed.  Constitutional:      Appearance: Normal appearance.  HENT:     Head: Normocephalic and atraumatic.     Mouth/Throat:     Pharynx: Oropharynx is clear.  Eyes:     Pupils: Pupils are equal, round, and reactive to light.  Cardiovascular:     Rate and Rhythm: Normal rate and regular rhythm.  Pulmonary:     Effort: Pulmonary effort is normal.     Breath sounds: Normal breath sounds.  Abdominal:     General: Abdomen is flat.     Palpations: Abdomen is soft.  Musculoskeletal:        General: Normal range of motion.  Skin:    General: Skin is warm and dry.  Neurological:     General: No focal deficit present.     Mental Status: She is alert. Mental status is at baseline.  Psychiatric:        Attention and Perception: She is inattentive.        Mood  and Affect: Mood normal. Affect is blunt.        Speech: Speech is delayed.        Behavior: Behavior is slowed.        Thought Content: Thought content is paranoid and delusional.        Cognition and Memory: Cognition is impaired.    Review of Systems  Constitutional: Negative.   HENT: Negative.    Eyes: Negative.   Respiratory: Negative.    Cardiovascular: Negative.   Gastrointestinal: Negative.   Musculoskeletal: Negative.   Skin: Negative.   Neurological: Negative.   Psychiatric/Behavioral: Negative.     Blood pressure (!) 95/54, pulse (!) 58, temperature 99.2 F (37.3 C), temperature source Oral, resp. rate 18, height 5\' 4"  (1.626 m), weight 40.4 kg, SpO2 100 %. Body mass index is 15.28 kg/m.   Treatment Plan Summary: Plan reviewing medicine.  I am going to try adding back maybe a different antipsychotic such as haloperidol at a higher dose.  Not sure what else we can do.  Patient is not at all likely to be cooperative with clozapine and outside the hospital.  Nevertheless current dosages do not seem to be touching her delusional thinking  , MD 04/27/2022, 3:15 PM

## 2022-04-27 NOTE — Plan of Care (Signed)
D: Patient alert and oriented. Patient denies pain. Patient denies anxiety and depression. Patient denies SI/HI/AVH.  Patient remains isolative to room during shift with exception to coming out for meals and medication.   A: Scheduled medications administered to patient, per MD orders.  Support and encouragement provided to patient.  Q15 minute safety checks maintained.   R: Patient compliant with medication administration and treatment plan. No adverse drug reactions noted. Patient remains safe on the unit at this time.  Problem: Education: Goal: Knowledge of [facility] Education information/materials will improve Outcome: Progressing Goal: Verbalization of understanding the information provided will improve Outcome: Progressing   Problem: Health Behavior/Discharge Planning: Goal: Compliance with treatment plan for underlying cause of condition will improve Outcome: Progressing   Problem: Safety: Goal: Periods of time without injury will increase Outcome: Progressing

## 2022-04-27 NOTE — Progress Notes (Signed)
Recreation Therapy Notes  Date: 04/27/2022  Time: 10:50 am   Location: Craft room       Behavioral response: [name]   Intervention Topic: Time Management   Discussion/Intervention: Patient refused to attend group.   Clinical Observations/Feedback:  Patient refused to attend group.    Lether Tesch LRT/CTRS        Keiandre Cygan 04/27/2022 11:45 AM

## 2022-04-27 NOTE — Progress Notes (Signed)
Patient calm and cooperative during assessment denying SI/HI/AVH. Patient isolative to her room but did come out for medications. Patient compliant with medication administration per MD orders. Pt given education, support, and encouragement to be active in her treatment plan. Pt stated she was ready to go. Pt being monitored Q 15 minutes for safety per unit protocol, remains safe on the unit

## 2022-04-27 NOTE — Group Note (Signed)
[facility] LCSW Group Therapy Note    Group Date: 04/27/2022 Start Time: 1300 End Time: 1400  Type of Therapy and Topic:  Group Therapy:  Overcoming Obstacles  Participation Level:  [facility] PARTICIPATION LEVEL: Minimal   Description of Group:   In this group patients will be encouraged to explore what they see as obstacles to their own wellness and recovery. They will be guided to discuss their thoughts, feelings, and behaviors related to these obstacles. The group will process together ways to cope with barriers, with attention given to specific choices patients can make. Each patient will be challenged to identify changes they are motivated to make in order to overcome their obstacles. This group will be process-oriented, with patients participating in exploration of their own experiences as well as giving and receiving support and challenge from other group members.  Therapeutic Goals: 1. Patient will identify personal and current obstacles as they relate to admission. 2. Patient will identify barriers that currently interfere with their wellness or overcoming obstacles.  3. Patient will identify feelings, thought process and behaviors related to these barriers. 4. Patient will identify two changes they are willing to make to overcome these obstacles:    Summary of Patient Progress Patient was present for the entirety of group. She participated in the icebreaker but outside of this was not involved in the larger conversation at all.    Therapeutic Modalities:   Cognitive Behavioral Therapy Solution Focused Therapy Motivational Interviewing Relapse Prevention Therapy   Shirl Harris, LCSW

## 2022-04-27 NOTE — [facility] Counselor (Signed)
CSW contacted guardian/sister, [name] Baggett-Lennon [phone]) who still maintains that she is uncomfortable with having pt discharge to her mother's home, feeling that this is not a safe discharge plan. She stated that the hospital should be trying to find her somewhere else to stay. CSW did explain to her that placement is not a reason for pt to remain in the hospital. However, CSW did inform her that he would consult with his supervisor regarding this issue. No other concerns expressed. Contact ended without incident.   CSW notified provider and discharge delayed.   Chalmers Guest. Guerry Bruin, MSW, [place], [place] 04/27/2022 4:43 PM

## 2022-04-28 DIAGNOSIS — F251 Schizoaffective disorder, depressive type: Secondary | ICD-10-CM | POA: Diagnosis not present

## 2022-04-28 NOTE — Progress Notes (Signed)
Pt refused to answer questions and angrily stated "I do not have any mental health problems. I do not care if you have to ask them, I have told you all already." Pt was sitting in the dayroom at the beginning of the shift. Pt has been in her room the rest of the day. Pt had no problem moving rooms and went to nurse after dinner for meds. Scheduled medications administered to pt, per MD orders. RN provided support and encouragement to pt. Q15 min safety checks implemented and continued. Pt safe on the unit. RN will continue to monitor and intervene as needed. Problem: Education: Goal: Knowledge of the prescribed therapeutic regimen will improve Outcome: Not Progressing   Problem: Role Relationship: Goal: Ability to interact with others will improve Outcome: Not Progressing   04/28/22 0808  Psych Admission Type (Psych Patients Only)  Admission Status Involuntary  Psychosocial Assessment  Patient Complaints None  Eye Contact Avoids  Facial Expression Flat;Angry  Affect Irritable  Speech Logical/coherent  Interaction Forwards little;Guarded;Minimal  Motor Activity Slow  Appearance/Hygiene Unremarkable;In scrubs  Behavior Characteristics Cooperative;Agitated  Mood Sad  Thought Process  Coherency WDL  Content WDL  Delusions None reported or observed  Perception Hallucinations  Hallucination None reported or observed  Judgment Poor  Confusion None  Danger to Self  Current suicidal ideation? Denies  Danger to Others  Danger to Others None reported or observed

## 2022-04-28 NOTE — Progress Notes (Signed)
Recreation Therapy Notes  Date: 04/28/2022  Time: 10:40 am   Location: Craft room    Behavioral response: Appropriate  Intervention Topic:  Goals   Discussion/Intervention:  Group content on today was focused on goals. Patients described what goals are and how they define goals. Individuals expressed how they go about setting goals and reaching them. The group identified how important goals are and if they make short term goals to reach long term goals. Patients described how many goals they work on at a time and what affects them not reaching their goal. Individuals described how much time they put into planning and obtaining their goals. The group participated in the intervention "My Goal Board" and made personal goal boards to help them achieve their goal. Clinical Observations/Feedback: Patient came to group and was focused on what peers and staff had to say about setting goals. She explained that her goal is to be discharged. Individual was social with peers and staff while participating in the intervention.    Dayleen Beske LRT/CTRS         Antoney Biven 04/28/2022 12:11 PM

## 2022-04-28 NOTE — Progress Notes (Signed)
[facility] MD Progress Note  04/28/2022 3:19 PM [name]  MRN:  [id number] Subjective: No significant change from yesterday.  Still delusional about her family paranoid and disorganized but with poor insight.  Mostly stays to herself.  Family still uncomfortable with any discharge planning.  Patient is on high doses of 2 antipsychotics without much change so far Principal Problem: Schizoaffective disorder, depressive type (HCC) Diagnosis: Principal Problem:   Schizoaffective disorder, depressive type (HCC)  Total Time spent with patient: 30 minutes  Past Psychiatric History: Past history of longstanding schizoaffective disorder or schizophrenia  Past Medical History:  Past Medical History:  Diagnosis Date   Anemia 10/02/2021   Non compliance w medication regimen    Schizophrenia (HCC)     Past Surgical History:  Procedure Laterality Date   BIOPSY  09/25/2021   Procedure: BIOPSY;  Surgeon: Rachael Fee, MD;  Location: [facility];  Service: Gastroenterology;;   ESOPHAGOGASTRODUODENOSCOPY (EGD) WITH PROPOFOL [name] 09/25/2021   Procedure: ESOPHAGOGASTRODUODENOSCOPY (EGD) WITH PROPOFOL;  Surgeon: Rachael Fee, MD;  Location: [facility];  Service: Gastroenterology;  Laterality: [name];  With NGT placement   Family History: History reviewed. No pertinent family history. Family Psychiatric  History: See previous Social History:  Social History   Substance and Sexual Activity  Alcohol Use Not Currently   Comment: Refuses to disclose how much     Social History   Substance and Sexual Activity  Drug Use No   Comment: Refuses to answer    Social History   Socioeconomic History   Marital status: Single    Spouse name: Not on file   Number of children: Not on file   Years of education: Not on file   Highest education level: Not on file  Occupational History   Not on file  Tobacco Use   Smoking status: Every Day    Packs/day: 2.00    Types: Cigarettes   Smokeless tobacco: Never   Substance and Sexual Activity   Alcohol use: Not Currently    Comment: Refuses to disclose how much   Drug use: No    Comment: Refuses to answer   Sexual activity: Not Currently    Comment: refused to answer  Other Topics Concern   Not on file  Social History Narrative   Not on file   Social Determinants of Health   Financial Resource Strain: Not on file  Food Insecurity: No Food Insecurity (04/08/2022)   Hunger Vital Sign    Worried About Running Out of Food in the Last Year: Never true    Ran Out of Food in the Last Year: Never true  Transportation Needs: No Transportation Needs (04/08/2022)   PRAPARE - Administrator, Civil Service (Medical): No    Lack of Transportation (Non-Medical): No  Physical Activity: Not on file  Stress: Not on file  Social Connections: Not on file   Additional Social History:                         Sleep: Fair  Appetite:  Fair  Current Medications: Current Facility-Administered Medications  Medication Dose Route Frequency Provider Last Rate Last Admin   acetaminophen (TYLENOL) tablet 650 mg  650 mg Oral Q6H PRN Banyan Goodchild T, MD       alum & mag hydroxide-simeth (MAALOX/MYLANTA) 200-200-20 MG/5ML suspension 30 mL  30 mL Oral Q4H PRN Hannah Crill, [name], MD       feeding supplement (BOOST / RESOURCE  BREEZE) liquid 1 Container  1 Container Oral QID Abron Neddo, [name], MD   1 Container at 04/26/22 [phone]   haloperidol (HALDOL) tablet 10 mg  10 mg Oral BID Manilla Strieter T, MD   10 mg at 04/28/22 4481   Or   haloperidol lactate (HALDOL) injection 5 mg  5 mg Intramuscular BID Armanii Pressnell, [name], MD       LORazepam (ATIVAN) tablet 1 mg  1 mg Oral BID Saraphina Lauderbaugh T, MD   1 mg at 04/28/22 8563   Or   LORazepam (ATIVAN) injection 1 mg  1 mg Intramuscular BID Srah Ake T, MD       magnesium hydroxide (MILK OF MAGNESIA) suspension 30 mL  30 mL Oral Daily PRN Kallon Caylor T, MD       mirtazapine (REMERON) tablet 15 mg  15 mg Oral QHS  Daltyn Degroat T, MD   15 mg at 04/27/22 2117   multivitamin with minerals tablet 1 tablet  1 tablet Oral Daily Karynn Deblasi, [name], MD   1 tablet at 04/28/22 1497   nicotine (NICODERM CQ - dosed in mg/24 hr) patch 7 mg  7 mg Transdermal Daily Ednah Hammock T, MD       OLANZapine (ZYPREXA) tablet 30 mg  30 mg Oral QHS Cathaleen Korol T, MD   30 mg at 04/27/22 2117   thiamine (VITAMIN B1) tablet 100 mg  100 mg Oral Daily Wavie Hashimi, [name], MD   100 mg at 04/28/22 0263    Lab Results: No results found for this or any previous visit (from the past 48 hour(s)).  Blood Alcohol level:  Lab Results  Component Value Date   ETH <10 03/29/2022   ETH <10 09/12/2021    Metabolic Disorder Labs: Lab Results  Component Value Date   HGBA1C 5.6 10/21/2021   MPG 114 10/21/2021   MPG 111 10/02/2021   Lab Results  Component Value Date   PROLACTIN 13.6 04/16/2015   Lab Results  Component Value Date   CHOL 189 10/21/2021   TRIG 52 10/21/2021   HDL 50 10/21/2021   CHOLHDL 3.8 10/21/2021   VLDL 10 10/21/2021   LDLCALC 129 (H) 10/21/2021   LDLCALC 93 10/01/2020    Physical Findings: AIMS: Facial and Oral Movements Muscles of Facial Expression: None, normal Lips and Perioral Area: None, normal Jaw: None, normal Tongue: None, normal,Extremity Movements Upper (arms, wrists, hands, fingers): None, normal Lower (legs, knees, ankles, toes): None, normal, Trunk Movements Neck, shoulders, hips: None, normal, Overall Severity Severity of abnormal movements (highest score from questions above): None, normal Incapacitation due to abnormal movements: None, normal Patient's awareness of abnormal movements (rate only patient's report): No Awareness, Dental Status Current problems with teeth and/or dentures?: No Does patient usually wear dentures?: No  CIWA:    COWS:     Musculoskeletal: Strength & Muscle Tone: within normal limits Gait & Station: normal Patient leans: [name]  Psychiatric Specialty  Exam:  Presentation  General Appearance:  Disheveled  Eye Contact: Fair  Speech: Pressured  Speech Volume: Increased  Handedness: Right   Mood and Affect  Mood: Irritable; Labile; Angry  Affect: Blunt; Labile   Thought Process  Thought Processes: Disorganized; Irrevelant  Descriptions of Associations:Loose  Orientation:Partial  Thought Content:Illogical; Rumination; Scattered; Tangential; Delusions  History of Schizophrenia/Schizoaffective disorder:Yes  Duration of Psychotic Symptoms:Greater than six months  Hallucinations:No data recorded Ideas of Reference:Percusatory; Paranoia  Suicidal Thoughts:No data recorded Homicidal Thoughts:No data recorded  Sensorium  Memory: Immediate  Fair; Recent Fair; Remote Fair  Judgment: Impaired  Insight: Poor; Lacking   Executive Functions  Concentration: Poor  Attention Span: Poor  Recall: Poor  Fund of Knowledge: Poor  Language: Poor   Psychomotor Activity  Psychomotor Activity:No data recorded  Assets  Assets: Physical Health; Resilience; Housing; Social Support   Sleep  Sleep:No data recorded   Physical Exam: Physical Exam Vitals reviewed.  Constitutional:      Appearance: Normal appearance.  HENT:     Head: Normocephalic and atraumatic.     Mouth/Throat:     Pharynx: Oropharynx is clear.  Eyes:     Pupils: Pupils are equal, round, and reactive to light.  Cardiovascular:     Rate and Rhythm: Normal rate and regular rhythm.  Pulmonary:     Effort: Pulmonary effort is normal.     Breath sounds: Normal breath sounds.  Abdominal:     General: Abdomen is flat.     Palpations: Abdomen is soft.  Musculoskeletal:        General: Normal range of motion.  Skin:    General: Skin is warm and dry.  Neurological:     General: No focal deficit present.     Mental Status: She is alert. Mental status is at baseline.  Psychiatric:        Attention and Perception: She is inattentive.         Mood and Affect: Mood normal. Affect is blunt.        Speech: Speech is tangential.        Behavior: Behavior is agitated. Behavior is not aggressive.        Thought Content: Thought content is paranoid and delusional.        Cognition and Memory: Memory is impaired.    Review of Systems  Constitutional: Negative.   HENT: Negative.    Eyes: Negative.   Respiratory: Negative.    Cardiovascular: Negative.   Gastrointestinal: Negative.   Musculoskeletal: Negative.   Skin: Negative.   Neurological: Negative.   Psychiatric/Behavioral: Negative.     Blood pressure 96/68, pulse 64, temperature 98.3 F (36.8 C), temperature source Oral, resp. rate 18, height 5\' 4"  (1.626 m), weight 40.4 kg, SpO2 97 %. Body mass index is 15.28 kg/m.   Treatment Plan Summary: Plan no change to medication.  Encourage group attendance.  Hoping to start seeing some improvement at these high doses of medicine eventually.  Alethia Berthold, MD 04/28/2022, 3:19 PM

## 2022-04-28 NOTE — Group Note (Signed)

## 2022-04-29 DIAGNOSIS — F251 Schizoaffective disorder, depressive type: Secondary | ICD-10-CM | POA: Diagnosis not present

## 2022-04-29 MED ORDER — MIRTAZAPINE 15 MG PO TABS
30.0000 mg | ORAL_TABLET | Freq: Every day | ORAL | Status: DC
  Administered 2022-04-29 – 2022-08-06 (×100): 30 mg via ORAL
  Filled 2022-04-29 (×100): qty 2

## 2022-04-29 NOTE — Group Note (Signed)
[facility] LCSW Group Therapy Note   Group Date: 04/29/2022 Start Time: 4383 End Time: 1415   Type of Therapy/Topic:  Group Therapy:  Emotion Regulation  Participation Level:  None    Description of Group:    The purpose of this group is to assist patients in learning to regulate negative emotions and experience positive emotions. Patients will be guided to discuss ways in which they have been vulnerable to their negative emotions. These vulnerabilities will be juxtaposed with experiences of positive emotions or situations, and patients challenged to use positive emotions to combat negative ones. Special emphasis will be placed on coping with negative emotions in conflict situations, and patients will process healthy conflict resolution skills.  Therapeutic Goals: Patient will identify two positive emotions or experiences to reflect on in order to balance out negative emotions:  Patient will label two or more emotions that they find the most difficult to experience:  Patient will be able to demonstrate positive conflict resolution skills through discussion or role plays:   Summary of Patient Progress: Patient was present for the entirety of the group process. She did not participate in any of the discussion.   Therapeutic Modalities:   Cognitive Behavioral Therapy Feelings Identification Dialectical Behavioral Therapy   Shirl Harris, LCSW

## 2022-04-29 NOTE — Progress Notes (Signed)
Continue to be isolative to her room.  Has to be prompted to come out for food and medication.  Is cooperative when approached. Denies si  hi avh depression and anxiety. Will continue to monitor with the q15 minute safety checks.    C Butler-Nicholson, LPN

## 2022-04-29 NOTE — Plan of Care (Signed)
  Problem: Health Behavior/Discharge Planning: Goal: Ability to manage health-related needs will improve Outcome: Progressing   Problem: Clinical Measurements: Goal: Will remain free from infection Outcome: Progressing Goal: Diagnostic test results will improve Outcome: Progressing Goal: Respiratory complications will improve Outcome: Progressing Goal: Cardiovascular complication will be avoided Outcome: Progressing   Problem: Nutrition: Goal: Adequate nutrition will be maintained Outcome: Progressing   Problem: Coping: Goal: Level of anxiety will decrease Outcome: Progressing   Problem: Safety: Goal: Periods of time without injury will increase Outcome: Progressing   Problem: Physical Regulation: Goal: Ability to maintain clinical measurements within normal limits will improve Outcome: Progressing   Problem: Coping: Goal: Ability to demonstrate self-control will improve Outcome: Progressing   Problem: Activity: Goal: Interest or engagement in activities will improve Outcome: Progressing Goal: Sleeping patterns will improve Outcome: Progressing

## 2022-04-29 NOTE — Progress Notes (Signed)
Pt denies SI/HI/AVH and verbally agrees to approach staff if these become apparent or before harming themselves/others. Rates depression 0/10. Rates anxiety 0/10. Rates pain 0/10.  Scheduled medications administered to pt, per MD orders. RN provided support and encouragement to pt. Q15 min safety checks implemented and continued. Pt safe on the unit. RN will continue to monitor and intervene as needed.  Problem: Coping: Goal: Will verbalize feelings Outcome: Not Progressing   Problem: Role Relationship: Goal: Ability to interact with others will improve Outcome: Not Progressing   04/29/22 0758  Psych Admission Type (Psych Patients Only)  Admission Status Involuntary  Psychosocial Assessment  Patient Complaints None  Eye Contact Brief  Facial Expression Flat;Sullen  Affect Flat  Speech Logical/coherent;Soft  Interaction Guarded;Minimal;Forwards little  Motor Activity Slow  Appearance/Hygiene Unremarkable;In scrubs  Behavior Characteristics Cooperative;Appropriate to situation  Mood Sad  Thought Process  Coherency WDL  Content WDL  Delusions None reported or observed  Perception Hallucinations  Hallucination None reported or observed  Judgment Poor  Confusion None  Danger to Self  Current suicidal ideation? Denies  Danger to Others  Danger to Others None reported or observed

## 2022-04-29 NOTE — Progress Notes (Signed)
Recreation Therapy Notes   Date: 04/29/2022  Time: 10:45 am   Location: Craft room    Behavioral response: Appropriate  Intervention Topic:  Relaxation   Discussion/Intervention:  Group content today was focused on relaxation. The group defined relaxation and identified healthy ways to relax. Individuals expressed how much time they spend relaxing. Patients expressed how much their life would be if they did not make time for themselves to relax. The group stated ways they could improve their relaxation techniques in the future.  Individuals participated in the intervention "Time to Relax" where they had a chance to experience different relaxation techniques.  Clinical Observations/Feedback: Patient came to group and was focused on what peers and staff had to say about relaxation. Individual was social with peers and staff while participating in the intervention.    Kenli Waldo LRT/CTRS         Glenetta Kiger 04/29/2022 12:15 PM

## 2022-04-29 NOTE — Plan of Care (Signed)
  Problem: Nutrition: Goal: Adequate nutrition will be maintained Outcome: Progressing   Problem: Coping: Goal: Level of anxiety will decrease Outcome: Progressing   Problem: Skin Integrity: Goal: Risk for impaired skin integrity will decrease Outcome: Progressing   Problem: Coping: Goal: Coping ability will improve Outcome: Progressing Goal: Will verbalize feelings Outcome: Progressing   Problem: Coping: Goal: Ability to demonstrate self-control will improve Outcome: Progressing

## 2022-04-29 NOTE — [facility] Group Notes (Signed)
[facility] Group Notes:  (Nursing/MHT/Case Management/Adjunct)  Date:  04/29/2022  Time:  9:29 AM  Type of Therapy:   community meeting  Participation Level:  None  Participation Quality:  Drowsy  Affect:  Flat  Cognitive:  Lacking  Insight: none  Engagement in Group:  None  Modes of Intervention:  Discussion and Education  Summary of Progress/Problems:  Antonieta Pert 04/29/2022, 9:29 AM

## 2022-04-29 NOTE — Progress Notes (Signed)
[facility] MD Progress Note  04/29/2022 2:04 PM Forrest Demuro  MRN:  [id number] Subjective: Follow-up with this patient 61 year old with chronic psychotic disorder.  Patient has no change in her appearance continues to be withdrawn much of the time.  She is taking her medicine but not interacting much on the unit and continues to insist that she needs to go home but without any insight into her home situation.  Denies suicidal thoughts.  Blunted affect Principal Problem: Schizoaffective disorder, depressive type ([place]) Diagnosis: Principal Problem:   Schizoaffective disorder, depressive type ([place])  Total Time spent with patient: 30 minutes  Past Psychiatric History: Past history of schizoaffective disorder  Past Medical History:  Past Medical History:  Diagnosis Date   Anemia 10/02/2021   Non compliance w medication regimen    Schizophrenia [facility])     Past Surgical History:  Procedure Laterality Date   BIOPSY  09/25/2021   Procedure: BIOPSY;  Surgeon: Milus Banister, MD;  Location: [facility];  Service: Gastroenterology;;   ESOPHAGOGASTRODUODENOSCOPY (EGD) WITH PROPOFOL [name] 09/25/2021   Procedure: ESOPHAGOGASTRODUODENOSCOPY (EGD) WITH PROPOFOL;  Surgeon: Milus Banister, MD;  Location: [facility];  Service: Gastroenterology;  Laterality: [name];  With NGT placement   Family History: History reviewed. No pertinent family history. Family Psychiatric  History: See previous Social History:  Social History   Substance and Sexual Activity  Alcohol Use Not Currently   Comment: Refuses to disclose how much     Social History   Substance and Sexual Activity  Drug Use No   Comment: Refuses to answer    Social History   Socioeconomic History   Marital status: Single    Spouse name: Not on file   Number of children: Not on file   Years of education: Not on file   Highest education level: Not on file  Occupational History   Not on file  Tobacco Use   Smoking status: Every Day     Packs/day: 2.00    Types: Cigarettes   Smokeless tobacco: Never  Substance and Sexual Activity   Alcohol use: Not Currently    Comment: Refuses to disclose how much   Drug use: No    Comment: Refuses to answer   Sexual activity: Not Currently    Comment: refused to answer  Other Topics Concern   Not on file  Social History Narrative   Not on file   Social Determinants of Health   Financial Resource Strain: Not on file  Food Insecurity: No Food Insecurity (04/08/2022)   Hunger Vital Sign    Worried About Running Out of Food in the Last Year: Never true    Ran Out of Food in the Last Year: Never true  Transportation Needs: No Transportation Needs (04/08/2022)   PRAPARE - Hydrologist (Medical): No    Lack of Transportation (Non-Medical): No  Physical Activity: Not on file  Stress: Not on file  Social Connections: Not on file   Additional Social History:                         Sleep: Fair  Appetite:  Poor  Current Medications: Current Facility-Administered Medications  Medication Dose Route Frequency Provider Last Rate Last Admin   acetaminophen (TYLENOL) tablet 650 mg  650 mg Oral Q6H PRN Dylan Monforte T, MD       alum & mag hydroxide-simeth (MAALOX/MYLANTA) 200-200-20 MG/5ML suspension 30 mL  30 mL Oral Q4H PRN  Lance Galas, [name], MD       feeding supplement (BOOST / RESOURCE BREEZE) liquid 1 Container  1 Container Oral QID Jaslynne Dahan, [name], MD   1 Container at 04/28/22 1648   haloperidol (HALDOL) tablet 10 mg  10 mg Oral BID Phinneas Shakoor T, MD   10 mg at 04/29/22 5993   Or   haloperidol lactate (HALDOL) injection 5 mg  5 mg Intramuscular BID Arlie Posch T, MD       LORazepam (ATIVAN) tablet 1 mg  1 mg Oral BID Akayla Brass T, MD   1 mg at 04/29/22 5701   Or   LORazepam (ATIVAN) injection 1 mg  1 mg Intramuscular BID Therma Lasure T, MD       magnesium hydroxide (MILK OF MAGNESIA) suspension 30 mL  30 mL Oral Daily PRN Devlyn Parish  T, MD       mirtazapine (REMERON) tablet 30 mg  30 mg Oral QHS Jack Mineau T, MD       multivitamin with minerals tablet 1 tablet  1 tablet Oral Daily Zillah Alexie, [name], MD   1 tablet at 04/29/22 0758   OLANZapine (ZYPREXA) tablet 30 mg  30 mg Oral QHS Yussuf Sawyers T, MD   30 mg at 04/28/22 2131   thiamine (VITAMIN B1) tablet 100 mg  100 mg Oral Daily Doloros Kwolek, [name], MD   100 mg at 04/29/22 7793    Lab Results: No results found for this or any previous visit (from the past 48 hour(s)).  Blood Alcohol level:  Lab Results  Component Value Date   ETH <10 03/29/2022   ETH <10 09/12/2021    Metabolic Disorder Labs: Lab Results  Component Value Date   HGBA1C 5.6 10/21/2021   MPG 114 10/21/2021   MPG 111 10/02/2021   Lab Results  Component Value Date   PROLACTIN 13.6 04/16/2015   Lab Results  Component Value Date   CHOL 189 10/21/2021   TRIG 52 10/21/2021   HDL 50 10/21/2021   CHOLHDL 3.8 10/21/2021   VLDL 10 10/21/2021   LDLCALC 129 (H) 10/21/2021   LDLCALC 93 10/01/2020    Physical Findings: AIMS: Facial and Oral Movements Muscles of Facial Expression: None, normal Lips and Perioral Area: None, normal Jaw: None, normal Tongue: None, normal,Extremity Movements Upper (arms, wrists, hands, fingers): None, normal Lower (legs, knees, ankles, toes): None, normal, Trunk Movements Neck, shoulders, hips: None, normal, Overall Severity Severity of abnormal movements (highest score from questions above): None, normal Incapacitation due to abnormal movements: None, normal Patient's awareness of abnormal movements (rate only patient's report): No Awareness, Dental Status Current problems with teeth and/or dentures?: No Does patient usually wear dentures?: No  CIWA:    COWS:     Musculoskeletal: Strength & Muscle Tone: within normal limits Gait & Station: normal Patient leans: [name]  Psychiatric Specialty Exam:  Presentation  General Appearance:  Disheveled  Eye  Contact: Fair  Speech: Pressured  Speech Volume: Increased  Handedness: Right   Mood and Affect  Mood: Irritable; Labile; Angry  Affect: Blunt; Labile   Thought Process  Thought Processes: Disorganized; Irrevelant  Descriptions of Associations:Loose  Orientation:Partial  Thought Content:Illogical; Rumination; Scattered; Tangential; Delusions  History of Schizophrenia/Schizoaffective disorder:Yes  Duration of Psychotic Symptoms:Greater than six months  Hallucinations:No data recorded Ideas of Reference:Percusatory; Paranoia  Suicidal Thoughts:No data recorded Homicidal Thoughts:No data recorded  Sensorium  Memory: Immediate Fair; Recent Fair; Remote Fair  Judgment: Impaired  Insight: Poor; Lacking  Executive Functions  Concentration: Poor  Attention Span: Poor  Recall: Poor  Fund of Knowledge: Poor  Language: Poor   Psychomotor Activity  Psychomotor Activity:No data recorded  Assets  Assets: Physical Health; Resilience; Housing; Social Support   Sleep  Sleep:No data recorded   Physical Exam: Physical Exam Vitals and nursing note reviewed.  Constitutional:      Appearance: Normal appearance.  HENT:     Head: Normocephalic and atraumatic.     Mouth/Throat:     Pharynx: Oropharynx is clear.  Eyes:     Pupils: Pupils are equal, round, and reactive to light.  Cardiovascular:     Rate and Rhythm: Normal rate and regular rhythm.  Pulmonary:     Effort: Pulmonary effort is normal.     Breath sounds: Normal breath sounds.  Abdominal:     General: Abdomen is flat.     Palpations: Abdomen is soft.  Musculoskeletal:        General: Normal range of motion.  Skin:    General: Skin is warm and dry.  Neurological:     General: No focal deficit present.     Mental Status: She is alert. Mental status is at baseline.  Psychiatric:        Attention and Perception: She is inattentive.        Mood and Affect: Mood normal. Affect  is blunt.        Speech: She is noncommunicative. Speech is delayed.        Behavior: Behavior is slowed.        Thought Content: Thought content is paranoid and delusional.    Review of Systems  Constitutional: Negative.   HENT: Negative.    Eyes: Negative.   Respiratory: Negative.    Cardiovascular: Negative.   Gastrointestinal: Negative.   Musculoskeletal: Negative.   Skin: Negative.   Neurological: Negative.   Psychiatric/Behavioral:  Positive for depression. The patient is nervous/anxious.    Blood pressure (!) 91/55, pulse (!) 57, temperature 98.5 F (36.9 C), temperature source Oral, resp. rate 16, height 5\' 4"  (1.626 m), weight 40.4 kg, SpO2 (!) 75 %. Body mass index is 15.28 kg/m.   Treatment Plan Summary: Medication management and Plan ECT had not been done this time because she did not appear to be catatonic in a manner similar to what we had before but as she is not responding to antipsychotics it is starting to seem to me that maybe we should pursue ECT.  Our service is currently full we would have to wait until at least next week but I am going to meanwhile increase her antidepressant to 30 mg no change in antipsychotics  , MD 04/29/2022, 2:04 PM

## 2022-04-30 DIAGNOSIS — F251 Schizoaffective disorder, depressive type: Secondary | ICD-10-CM | POA: Diagnosis not present

## 2022-04-30 NOTE — Plan of Care (Signed)
  Problem: Education: Goal: Knowledge of General Education information will improve Description: Including pain rating scale, medication(s)/side effects and non-pharmacologic comfort measures Outcome: Progressing   Problem: Nutrition: Goal: Adequate nutrition will be maintained Outcome: Progressing   Problem: Coping: Goal: Coping ability will improve Outcome: Progressing   Problem: Health Behavior/Discharge Planning: Goal: Compliance with prescribed medication regimen will improve Outcome: Progressing

## 2022-04-30 NOTE — Progress Notes (Signed)
[facility] MD Progress Note  04/30/2022 1:41 PM [name]  MRN:  [id number] Subjective: Follow-up 61 year old woman with schizoaffective disorder.  No real change in her condition.  Continues to be psychotic and have poor insight specifically when it comes to discharge planning.  Eats and comes out of her room but does not really participate very enthusiastically Principal Problem: Schizoaffective disorder, depressive type ([place]) Diagnosis: Principal Problem:   Schizoaffective disorder, depressive type ([place])  Total Time spent with patient: 30 minutes  Past Psychiatric History: Past history of schizoaffective disorder  Past Medical History:  Past Medical History:  Diagnosis Date   Anemia 10/02/2021   Non compliance w medication regimen    Schizophrenia ([place])     Past Surgical History:  Procedure Laterality Date   BIOPSY  09/25/2021   Procedure: BIOPSY;  Surgeon: Milus Banister, MD;  Location: [facility];  Service: Gastroenterology;;   ESOPHAGOGASTRODUODENOSCOPY (EGD) WITH PROPOFOL [name] 09/25/2021   Procedure: ESOPHAGOGASTRODUODENOSCOPY (EGD) WITH PROPOFOL;  Surgeon: Milus Banister, MD;  Location: [facility];  Service: Gastroenterology;  Laterality: [name];  With NGT placement   Family History: History reviewed. No pertinent family history. Family Psychiatric  History: See previous Social History:  Social History   Substance and Sexual Activity  Alcohol Use Not Currently   Comment: Refuses to disclose how much     Social History   Substance and Sexual Activity  Drug Use No   Comment: Refuses to answer    Social History   Socioeconomic History   Marital status: Single    Spouse name: Not on file   Number of children: Not on file   Years of education: Not on file   Highest education level: Not on file  Occupational History   Not on file  Tobacco Use   Smoking status: Every Day    Packs/day: 2.00    Types: Cigarettes   Smokeless tobacco: Never  Substance and Sexual  Activity   Alcohol use: Not Currently    Comment: Refuses to disclose how much   Drug use: No    Comment: Refuses to answer   Sexual activity: Not Currently    Comment: refused to answer  Other Topics Concern   Not on file  Social History Narrative   Not on file   Social Determinants of Health   Financial Resource Strain: Not on file  Food Insecurity: No Food Insecurity (04/08/2022)   Hunger Vital Sign    Worried About Running Out of Food in the Last Year: Never true    Ran Out of Food in the Last Year: Never true  Transportation Needs: No Transportation Needs (04/08/2022)   PRAPARE - Hydrologist (Medical): No    Lack of Transportation (Non-Medical): No  Physical Activity: Not on file  Stress: Not on file  Social Connections: Not on file   Additional Social History:                         Sleep: Fair  Appetite:  Fair  Current Medications: Current Facility-Administered Medications  Medication Dose Route Frequency Provider Last Rate Last Admin   acetaminophen (TYLENOL) tablet 650 mg  650 mg Oral Q6H PRN Joel Mericle T, MD       alum & mag hydroxide-simeth (MAALOX/MYLANTA) 200-200-20 MG/5ML suspension 30 mL  30 mL Oral Q4H PRN Mattson Dayal, [name], MD       feeding supplement (BOOST / RESOURCE BREEZE) liquid 1 Container  1 Container Oral QID Laurena Valko, [name], MD   1 Container at 04/30/22 0859   haloperidol (HALDOL) tablet 10 mg  10 mg Oral BID Yeraldin Litzenberger, [name], MD   10 mg at 04/30/22 3546   Or   haloperidol lactate (HALDOL) injection 5 mg  5 mg Intramuscular BID Tadarius Maland, [name], MD       LORazepam (ATIVAN) tablet 1 mg  1 mg Oral BID Yuji Walth T, MD   1 mg at 04/30/22 5681   Or   LORazepam (ATIVAN) injection 1 mg  1 mg Intramuscular BID Eriona Kinchen T, MD       magnesium hydroxide (MILK OF MAGNESIA) suspension 30 mL  30 mL Oral Daily PRN Omarion Minnehan T, MD       mirtazapine (REMERON) tablet 30 mg  30 mg Oral QHS Zyrell Carmean T, MD   30  mg at 04/29/22 2123   multivitamin with minerals tablet 1 tablet  1 tablet Oral Daily Fredy Gladu, [name], MD   1 tablet at 04/30/22 0857   OLANZapine (ZYPREXA) tablet 30 mg  30 mg Oral QHS Leolia Vinzant T, MD   30 mg at 04/29/22 2123   thiamine (VITAMIN B1) tablet 100 mg  100 mg Oral Daily Deldrick Linch, [name], MD   100 mg at 04/30/22 0857    Lab Results: No results found for this or any previous visit (from the past 48 hour(s)).  Blood Alcohol level:  Lab Results  Component Value Date   ETH <10 03/29/2022   ETH <10 09/12/2021    Metabolic Disorder Labs: Lab Results  Component Value Date   HGBA1C 5.6 10/21/2021   MPG 114 10/21/2021   MPG 111 10/02/2021   Lab Results  Component Value Date   PROLACTIN 13.6 04/16/2015   Lab Results  Component Value Date   CHOL 189 10/21/2021   TRIG 52 10/21/2021   HDL 50 10/21/2021   CHOLHDL 3.8 10/21/2021   VLDL 10 10/21/2021   LDLCALC 129 (H) 10/21/2021   LDLCALC 93 10/01/2020    Physical Findings: AIMS: Facial and Oral Movements Muscles of Facial Expression: None, normal Lips and Perioral Area: None, normal Jaw: None, normal Tongue: None, normal,Extremity Movements Upper (arms, wrists, hands, fingers): None, normal Lower (legs, knees, ankles, toes): None, normal, Trunk Movements Neck, shoulders, hips: None, normal, Overall Severity Severity of abnormal movements (highest score from questions above): None, normal Incapacitation due to abnormal movements: None, normal Patient's awareness of abnormal movements (rate only patient's report): No Awareness, Dental Status Current problems with teeth and/or dentures?: No Does patient usually wear dentures?: No  CIWA:    COWS:     Musculoskeletal: Strength & Muscle Tone: within normal limits Gait & Station: normal Patient leans: [name]  Psychiatric Specialty Exam:  Presentation  General Appearance:  Disheveled  Eye Contact: Fair  Speech: Pressured  Speech  Volume: Increased  Handedness: Right   Mood and Affect  Mood: Irritable; Labile; Angry  Affect: Blunt; Labile   Thought Process  Thought Processes: Disorganized; Irrevelant  Descriptions of Associations:Loose  Orientation:Partial  Thought Content:Illogical; Rumination; Scattered; Tangential; Delusions  History of Schizophrenia/Schizoaffective disorder:Yes  Duration of Psychotic Symptoms:Greater than six months  Hallucinations:No data recorded Ideas of Reference:Percusatory; Paranoia  Suicidal Thoughts:No data recorded Homicidal Thoughts:No data recorded  Sensorium  Memory: Immediate Fair; Recent Fair; Remote Fair  Judgment: Impaired  Insight: Poor; Lacking   Executive Functions  Concentration: Poor  Attention Span: Poor  Recall: Poor  Fund of Knowledge: Poor  Language: Poor   Psychomotor Activity  Psychomotor Activity:No data recorded  Assets  Assets: Physical Health; Resilience; Housing; Social Support   Sleep  Sleep:No data recorded   Physical Exam: Physical Exam Vitals reviewed.  Constitutional:      Appearance: Normal appearance.  HENT:     Head: Normocephalic and atraumatic.     Mouth/Throat:     Pharynx: Oropharynx is clear.  Eyes:     Pupils: Pupils are equal, round, and reactive to light.  Cardiovascular:     Rate and Rhythm: Normal rate and regular rhythm.  Pulmonary:     Effort: Pulmonary effort is normal.     Breath sounds: Normal breath sounds.  Abdominal:     General: Abdomen is flat.     Palpations: Abdomen is soft.  Musculoskeletal:        General: Normal range of motion.  Skin:    General: Skin is warm and dry.  Neurological:     General: No focal deficit present.     Mental Status: She is alert. Mental status is at baseline.  Psychiatric:        Attention and Perception: Attention normal.        Mood and Affect: Mood normal.        Speech: Speech normal.        Behavior: Behavior normal.         Thought Content: Thought content normal.        Cognition and Memory: Cognition normal.    Review of Systems  Constitutional: Negative.   HENT: Negative.    Eyes: Negative.   Respiratory: Negative.    Cardiovascular: Negative.   Gastrointestinal: Negative.   Musculoskeletal: Negative.   Skin: Negative.   Neurological: Negative.   Psychiatric/Behavioral: Negative.     Blood pressure 107/70, pulse (!) 57, temperature 98.6 F (37 C), temperature source Oral, resp. rate 18, height 5\' 4"  (1.626 m), weight 40.4 kg, SpO2 (!) 75 %. Body mass index is 15.28 kg/m.   Treatment Plan Summary: Plan patient has been stabilized on robust doses of 2 antipsychotics and still has not relinquish psychosis or agitated disorganization.  I am considering rerecommending ECT although she is not so much specifically endorsing any mood symptoms.  At this point the ECT schedule is full this week and the earliest we would be able to consider is next week.  I will start bringing it up with the patient.  , MD 04/30/2022, 1:41 PM

## 2022-04-30 NOTE — Progress Notes (Signed)
Patient isolative to her room.  Minimal interaction with staff and peers.  She does come out for snack and meds without being prompted. She denies si  hi  avh depression and anxiety at this encounter. States that she "just wants to get out of here." She is med compliant and takes her meds without issue. Support and encouragement offered.  Q15 minute safety checks in place.    C Butler-Nichoslon, LPN

## 2022-04-30 NOTE — Group Note (Signed)
[place] LCSW Group Therapy Note   Group Date: 04/30/2022 Start Time: 1300 End Time: 1400   Type of Therapy/Topic:  Group Therapy:  Emotion Regulation  Participation Level:  Minimal   Mood: Blunted   Description of Group:    The purpose of this group is to assist patients in learning to regulate negative emotions and experience positive emotions. Patients will be guided to discuss ways in which they have been vulnerable to their negative emotions. These vulnerabilities will be juxtaposed with experiences of positive emotions or situations, and patients challenged to use positive emotions to combat negative ones. Special emphasis will be placed on coping with negative emotions in conflict situations, and patients will process healthy conflict resolution skills.  Therapeutic Goals: Patient will identify two positive emotions or experiences to reflect on in order to balance out negative emotions:  Patient will label two or more emotions that they find the most difficult to experience:  Patient will be able to demonstrate positive conflict resolution skills through discussion or role plays:   Summary of Patient Progress: Patient was present for the entirety of group session. Patient participated in opening and closing remarks. However, patient did not contribute at all to the topic of discussion despite encouraged participation.      Therapeutic Modalities:   Cognitive Behavioral Therapy Feelings Identification Dialectical Behavioral Therapy   Durenda Hurt, [place]

## 2022-04-30 NOTE — Plan of Care (Signed)
Pt rates last night sleep as fair. She reports good appetite and concentration, normal energy.  Denies SI, HI, AVH.  Denies depression, anxiety, hopelessness.  Flat affect.  Denies pain.  Pt says today she wants to work on discharge.  Pt says she takes "so many pills."  However, Pt is compliant with medication.  Pt refused nutritional supplements at lunch and dinner.  Continued q15 minute checks for safety.

## 2022-05-01 DIAGNOSIS — F251 Schizoaffective disorder, depressive type: Secondary | ICD-10-CM | POA: Diagnosis not present

## 2022-05-01 NOTE — Progress Notes (Signed)
Patient remains isolative to her self.  She is more active and has been visible in the milue but sits by herself and does not engage with her peers.  She is med complaint and received her meds without incident.  She denies si hi avh but is flat and preoccupied.  Will continue to monitor with q15 minute safety checks.     C butler-Nicholson, LPN

## 2022-05-01 NOTE — Plan of Care (Signed)
  Problem: Education: Goal: Knowledge of General Education information will improve Description: Including pain rating scale, medication(s)/side effects and non-pharmacologic comfort measures Outcome: Progressing   Problem: Coping: Goal: Level of anxiety will decrease Outcome: Progressing   Problem: Elimination: Goal: Will not experience complications related to bowel motility Outcome: Progressing   Problem: Education: Goal: Will be free of psychotic symptoms Outcome: Progressing   Problem: Coping: Goal: Coping ability will improve Outcome: Progressing

## 2022-05-01 NOTE — Progress Notes (Signed)
[facility] MD Progress Note  05/01/2022 3:41 PM [name]  MRN:  [id number] Subjective: Patient seen and chart reviewed.  This is a 61 year old woman with schizoaffective disorder who has been in the hospital for an extended time.  Problem at home is that she is delusional about her family resulting in agitated behavior which puts both her and her family at risk.  In the hospital she has more or less stabilized medically but remains very psychotic.  Once again today attempted to talk with her about the discharge plan problem with just made her more irate yelling at me that she had no family and no sisters and becoming very irritable refusing to listen to any reason.  I had been attempting to discuss the possibility of ECT with her but the patient absolutely refused it and walked out. Principal Problem: Schizoaffective disorder, depressive type (HCC) Diagnosis: Principal Problem:   Schizoaffective disorder, depressive type (HCC)  Total Time spent with patient: 30 minutes  Past Psychiatric History: Past history of schizoaffective disorder  Past Medical History:  Past Medical History:  Diagnosis Date   Anemia 10/02/2021   Non compliance w medication regimen    Schizophrenia [facility])     Past Surgical History:  Procedure Laterality Date   BIOPSY  09/25/2021   Procedure: BIOPSY;  Surgeon: Rachael Fee, MD;  Location: [facility];  Service: Gastroenterology;;   ESOPHAGOGASTRODUODENOSCOPY (EGD) WITH PROPOFOL [name] 09/25/2021   Procedure: ESOPHAGOGASTRODUODENOSCOPY (EGD) WITH PROPOFOL;  Surgeon: Rachael Fee, MD;  Location: [facility];  Service: Gastroenterology;  Laterality: [name];  With NGT placement   Family History: History reviewed. No pertinent family history. Family Psychiatric  History: See previous Social History:  Social History   Substance and Sexual Activity  Alcohol Use Not Currently   Comment: Refuses to disclose how much     Social History   Substance and Sexual Activity  Drug  Use No   Comment: Refuses to answer    Social History   Socioeconomic History   Marital status: Single    Spouse name: Not on file   Number of children: Not on file   Years of education: Not on file   Highest education level: Not on file  Occupational History   Not on file  Tobacco Use   Smoking status: Every Day    Packs/day: 2.00    Types: Cigarettes   Smokeless tobacco: Never  Substance and Sexual Activity   Alcohol use: Not Currently    Comment: Refuses to disclose how much   Drug use: No    Comment: Refuses to answer   Sexual activity: Not Currently    Comment: refused to answer  Other Topics Concern   Not on file  Social History Narrative   Not on file   Social Determinants of Health   Financial Resource Strain: Not on file  Food Insecurity: No Food Insecurity (04/08/2022)   Hunger Vital Sign    Worried About Running Out of Food in the Last Year: Never true    Ran Out of Food in the Last Year: Never true  Transportation Needs: No Transportation Needs (04/08/2022)   PRAPARE - Administrator, Civil Service (Medical): No    Lack of Transportation (Non-Medical): No  Physical Activity: Not on file  Stress: Not on file  Social Connections: Not on file   Additional Social History:  Sleep: Fair  Appetite:  Fair  Current Medications: Current Facility-Administered Medications  Medication Dose Route Frequency Provider Last Rate Last Admin   acetaminophen (TYLENOL) tablet 650 mg  650 mg Oral Q6H PRN Yeiden Frenkel T, MD       alum & mag hydroxide-simeth (MAALOX/MYLANTA) 200-200-20 MG/5ML suspension 30 mL  30 mL Oral Q4H PRN Shalese Strahan, [name], MD       feeding supplement (BOOST / RESOURCE BREEZE) liquid 1 Container  1 Container Oral QID Cyanna Neace, [name], MD   1 Container at 05/01/22 1241   haloperidol (HALDOL) tablet 10 mg  10 mg Oral BID Zorawar Strollo T, MD   10 mg at 05/01/22 5427   Or   haloperidol lactate (HALDOL)  injection 5 mg  5 mg Intramuscular BID Treshun Wold T, MD       LORazepam (ATIVAN) tablet 1 mg  1 mg Oral BID Jasmin Winberry T, MD   1 mg at 05/01/22 0623   Or   LORazepam (ATIVAN) injection 1 mg  1 mg Intramuscular BID Shyquan Stallbaumer T, MD       magnesium hydroxide (MILK OF MAGNESIA) suspension 30 mL  30 mL Oral Daily PRN Vida Nicol T, MD       mirtazapine (REMERON) tablet 30 mg  30 mg Oral QHS Hadessah Grennan T, MD   30 mg at 04/30/22 2042   multivitamin with minerals tablet 1 tablet  1 tablet Oral Daily Anabia Weatherwax, [name], MD   1 tablet at 05/01/22 0804   OLANZapine (ZYPREXA) tablet 30 mg  30 mg Oral QHS Harl Wiechmann T, MD   30 mg at 04/30/22 2042   thiamine (VITAMIN B1) tablet 100 mg  100 mg Oral Daily Quenna Doepke, [name], MD   100 mg at 05/01/22 7628    Lab Results: No results found for this or any previous visit (from the past 48 hour(s)).  Blood Alcohol level:  Lab Results  Component Value Date   ETH <10 03/29/2022   ETH <10 09/12/2021    Metabolic Disorder Labs: Lab Results  Component Value Date   HGBA1C 5.6 10/21/2021   MPG 114 10/21/2021   MPG 111 10/02/2021   Lab Results  Component Value Date   PROLACTIN 13.6 04/16/2015   Lab Results  Component Value Date   CHOL 189 10/21/2021   TRIG 52 10/21/2021   HDL 50 10/21/2021   CHOLHDL 3.8 10/21/2021   VLDL 10 10/21/2021   LDLCALC 129 (H) 10/21/2021   LDLCALC 93 10/01/2020    Physical Findings: AIMS: Facial and Oral Movements Muscles of Facial Expression: None, normal Lips and Perioral Area: None, normal Jaw: None, normal Tongue: None, normal,Extremity Movements Upper (arms, wrists, hands, fingers): None, normal Lower (legs, knees, ankles, toes): None, normal, Trunk Movements Neck, shoulders, hips: None, normal, Overall Severity Severity of abnormal movements (highest score from questions above): None, normal Incapacitation due to abnormal movements: None, normal Patient's awareness of abnormal movements (rate only  patient's report): No Awareness, Dental Status Current problems with teeth and/or dentures?: No Does patient usually wear dentures?: No  CIWA:    COWS:     Musculoskeletal: Strength & Muscle Tone: within normal limits Gait & Station: normal Patient leans: [name]  Psychiatric Specialty Exam:  Presentation  General Appearance:  Disheveled  Eye Contact: Fair  Speech: Pressured  Speech Volume: Increased  Handedness: Right   Mood and Affect  Mood: Irritable; Labile; Angry  Affect: Blunt; Labile   Thought Process  Thought Processes:  Disorganized; Irrevelant  Descriptions of Associations:Loose  Orientation:Partial  Thought Content:Illogical; Rumination; Scattered; Tangential; Delusions  History of Schizophrenia/Schizoaffective disorder:Yes  Duration of Psychotic Symptoms:Greater than six months  Hallucinations:No data recorded Ideas of Reference:Percusatory; Paranoia  Suicidal Thoughts:No data recorded Homicidal Thoughts:No data recorded  Sensorium  Memory: Immediate Fair; Recent Fair; Remote Fair  Judgment: Impaired  Insight: Poor; Lacking   Executive Functions  Concentration: Poor  Attention Span: Poor  Recall: Poor  Fund of Knowledge: Poor  Language: Poor   Psychomotor Activity  Psychomotor Activity:No data recorded  Assets  Assets: Physical Health; Resilience; Housing; Social Support   Sleep  Sleep:No data recorded   Physical Exam: Physical Exam Vitals and nursing note reviewed.  Constitutional:      Appearance: Normal appearance.  HENT:     Head: Normocephalic and atraumatic.     Mouth/Throat:     Pharynx: Oropharynx is clear.  Eyes:     Pupils: Pupils are equal, round, and reactive to light.  Cardiovascular:     Rate and Rhythm: Normal rate and regular rhythm.  Pulmonary:     Effort: Pulmonary effort is normal.     Breath sounds: Normal breath sounds.  Abdominal:     General: Abdomen is flat.      Palpations: Abdomen is soft.  Musculoskeletal:        General: Normal range of motion.  Skin:    General: Skin is warm and dry.  Neurological:     General: No focal deficit present.     Mental Status: She is alert. Mental status is at baseline.  Psychiatric:        Attention and Perception: She is inattentive.        Mood and Affect: Mood normal. Affect is labile, angry and inappropriate.        Speech: Speech is tangential.        Behavior: Behavior is agitated. Behavior is not aggressive.        Thought Content: Thought content normal.        Cognition and Memory: Cognition is impaired.        Judgment: Judgment is impulsive.    Review of Systems  Constitutional: Negative.   HENT: Negative.    Eyes: Negative.   Respiratory: Negative.    Cardiovascular: Negative.   Gastrointestinal: Negative.   Musculoskeletal: Negative.   Skin: Negative.   Neurological: Negative.   Psychiatric/Behavioral:  The patient is nervous/anxious.    Blood pressure (!) 100/58, pulse (!) 57, temperature 98.6 F (37 C), temperature source Oral, resp. rate 18, height 5\' 4"  (1.626 m), weight 40.4 kg, SpO2 100 %. Body mass index is 15.28 kg/m.   Treatment Plan Summary: Medication management and Plan patient remains psychotic agitated and irritable.  On 2 antipsychotics and antidepressant.  Unclear what if anything is going to break her out of this.  I had considered that ECT might be helpful but the patient is absolutely refusing at this point I do not see a justification for forcing it against her will and she is her own guardian.  I am gradually increasing the mirtazapine and we are giving her treatment more time to see if eventually we might reach a point where discharge would be acceptable  Alethia Berthold, MD 05/01/2022, 3:41 PM

## 2022-05-01 NOTE — Progress Notes (Signed)
Recreation Therapy Notes    Date: 05/01/2022   Time: 1:30pm    Location: Court yard       Behavioral response: [name]   Intervention Topic: Decision Making    Discussion/Intervention: Patient refused to attend group.    Clinical Observations/Feedback:  Patient refused to attend group.    [name] LRT/CTRS        [name] 05/01/2022 1:59 PM

## 2022-05-01 NOTE — Progress Notes (Signed)
D- Patient is alert and oriented. Patient reported that she slept good last night and had no complaints to voice. Patient denied SI, HI, AVH, and pain at this time. Patient reported improved mood . Patient goal for today is "to do what needs to be done to get discharged".   A- Scheduled medications administered to patient, per MD orders. Support and encouragement provided.  Routine safety checks conducted every 15 minutes.  Patient informed to notify staff with problems or concerns.   R- No adverse drug reactions noted. Patient contracts for safety at this time. Patient compliant with medications and treatment plan. Patient receptive, calm, and cooperative. Patient remains safe at this time.

## 2022-05-01 NOTE — Group Note (Addendum)
[facility] LCSW Group Therapy Note   Group Date: 05/01/2022 Start Time: 1300 End Time: 1400  Type of Therapy and Topic:  Group Therapy:  Feelings around Relapse and Recovery  Participation Level:  Did Not Attend    Description of Group:    Patients in this group will discuss emotions they experience before and after a relapse. They will process how experiencing these feelings, or avoidance of experiencing them, relates to having a relapse. Facilitator will guide patients to explore emotions they have related to recovery. Patients will be encouraged to process which emotions are more powerful. They will be guided to discuss the emotional reaction significant others in their lives may have to patients' relapse or recovery. Patients will be assisted in exploring ways to respond to the emotions of others without this contributing to a relapse.  Therapeutic Goals: Patient will identify two or more emotions that lead to relapse for them:  Patient will identify two emotions that result when they relapse:  Patient will identify two emotions related to recovery:  Patient will demonstrate ability to communicate their needs through discussion and/or role plays.   Summary of Patient Progress: Pt declined to attend group despite invitation.   Therapeutic Modalities:   Cognitive Behavioral Therapy Solution-Focused Therapy Assertiveness Training Relapse Prevention Therapy   Lajoya Dombek R Min Tunnell, LCSW 

## 2022-05-01 NOTE — BH IP Treatment Plan (Signed)
Interdisciplinary Treatment and Diagnostic Plan Update  05/01/2022 Time of Session: 08:30 [name] MRN: [id number]  Principal Diagnosis: Schizoaffective disorder, depressive type ([place])  Secondary Diagnoses: Principal Problem:   Schizoaffective disorder, depressive type ([place])   Current Medications:  Current Facility-Administered Medications  Medication Dose Route Frequency Provider Last Rate Last Admin   acetaminophen (TYLENOL) tablet 650 mg  650 mg Oral Q6H PRN [name], [name], MD       alum & mag hydroxide-simeth (MAALOX/MYLANTA) 200-200-20 MG/5ML suspension 30 mL  30 mL Oral Q4H PRN [name], [name], MD       feeding supplement (BOOST / RESOURCE BREEZE) liquid 1 Container  1 Container Oral QID [name], [name], MD   1 Container at 04/30/22 0859   haloperidol (HALDOL) tablet 10 mg  10 mg Oral BID [name], [name], MD   10 mg at 05/01/22 5427   Or   haloperidol lactate (HALDOL) injection 5 mg  5 mg Intramuscular BID [name], [name], MD       LORazepam (ATIVAN) tablet 1 mg  1 mg Oral BID [name], [name], MD   1 mg at 05/01/22 0623   Or   LORazepam (ATIVAN) injection 1 mg  1 mg Intramuscular BID [name], [name], MD       magnesium hydroxide (MILK OF MAGNESIA) suspension 30 mL  30 mL Oral Daily PRN [name], [name], MD       mirtazapine (REMERON) tablet 30 mg  30 mg Oral QHS [name], [name], MD   30 mg at 04/30/22 2042   multivitamin with minerals tablet 1 tablet  1 tablet Oral Daily [name], [name], MD   1 tablet at 05/01/22 0804   OLANZapine (ZYPREXA) tablet 30 mg  30 mg Oral QHS [name], [name], MD   30 mg at 04/30/22 2042   thiamine (VITAMIN B1) tablet 100 mg  100 mg Oral Daily [name], [name], MD   100 mg at 05/01/22 7628   PTA Medications: Medications Prior to Admission  Medication Sig Dispense Refill Last Dose   clonazePAM (KLONOPIN) 0.5 MG tablet Take 0.5 tablets (0.25 mg total) by mouth 2 (two) times daily. (Patient not taking: Reported on 03/30/2022) 60 tablet 0    haloperidol  (HALDOL) 5 MG tablet Take 1 tablet (5 mg total) by mouth 2 (two) times daily. (Patient not taking: Reported on 03/30/2022) 60 tablet 3    midodrine (PROAMATINE) 2.5 MG tablet Take 1 tablet (2.5 mg total) by mouth 3 (three) times daily with meals. (Patient not taking: Reported on 03/30/2022)  0    mirtazapine (REMERON) 15 MG tablet Take 0.5 tablets (7.5 mg total) by mouth at bedtime. (Patient not taking: Reported on 03/30/2022) 30 tablet 2    Multiple Vitamin (MULTIVITAMIN WITH MINERALS) TABS tablet Take 1 tablet by mouth daily. (Patient not taking: Reported on 03/30/2022)      OLANZapine (ZYPREXA) 5 MG tablet Take 1 tablet (5 mg total) by mouth at bedtime. (Patient not taking: Reported on 03/30/2022) 30 tablet 2    thiamine 100 MG tablet Take 1 tablet (100 mg total) by mouth daily. (Patient not taking: Reported on 03/30/2022)       Patient Stressors: Marital or family conflict    Patient Strengths: Supportive family/friends   Treatment Modalities: Medication Management, Group therapy, Case management,  1 to 1 session with clinician, Psychoeducation, Recreational therapy.   Physician Treatment Plan for Primary Diagnosis: Schizoaffective disorder, depressive type ([place]) Long Term Goal(s): Improvement in symptoms  so as ready for discharge   Short Term Goals: Compliance with prescribed medications will improve Ability to verbalize feelings will improve Ability to demonstrate self-control will improve Ability to identify and develop effective coping behaviors will improve  Medication Management: Evaluate patient's response, side effects, and tolerance of medication regimen.  Therapeutic Interventions: 1 to 1 sessions, Unit Group sessions and Medication administration.  Evaluation of Outcomes: Progressing  Physician Treatment Plan for Secondary Diagnosis: Principal Problem:   Schizoaffective disorder, depressive type (HCC)  Long Term Goal(s): Improvement in symptoms so as ready for discharge    Short Term Goals: Compliance with prescribed medications will improve Ability to verbalize feelings will improve Ability to demonstrate self-control will improve Ability to identify and develop effective coping behaviors will improve     Medication Management: Evaluate patient's response, side effects, and tolerance of medication regimen.  Therapeutic Interventions: 1 to 1 sessions, Unit Group sessions and Medication administration.  Evaluation of Outcomes: Progressing   RN Treatment Plan for Primary Diagnosis: Schizoaffective disorder, depressive type (HCC) Long Term Goal(s): Knowledge of disease and therapeutic regimen to maintain health will improve  Short Term Goals: Ability to remain free from injury will improve, Ability to verbalize frustration and anger appropriately will improve, Ability to demonstrate self-control, Ability to participate in decision making will improve, Ability to verbalize feelings will improve, Ability to disclose and discuss suicidal ideas, Ability to identify and develop effective coping behaviors will improve, and Compliance with prescribed medications will improve  Medication Management: RN will administer medications as ordered by provider, will assess and evaluate patient's response and provide education to patient for prescribed medication. RN will report any adverse and/or side effects to prescribing provider.  Therapeutic Interventions: 1 on 1 counseling sessions, Psychoeducation, Medication administration, Evaluate responses to treatment, Monitor vital signs and CBGs as ordered, Perform/monitor CIWA, COWS, AIMS and Fall Risk screenings as ordered, Perform wound care treatments as ordered.  Evaluation of Outcomes: Progressing   LCSW Treatment Plan for Primary Diagnosis: Schizoaffective disorder, depressive type (HCC) Long Term Goal(s): Safe transition to appropriate next level of care at discharge, Engage patient in therapeutic group addressing  interpersonal concerns.  Short Term Goals: Engage patient in aftercare planning with referrals and resources, Increase social support, Increase ability to appropriately verbalize feelings, Increase emotional regulation, Facilitate acceptance of mental health diagnosis and concerns, and Increase skills for wellness and recovery  Therapeutic Interventions: Assess for all discharge needs, 1 to 1 time with Social worker, Explore available resources and support systems, Assess for adequacy in community support network, Educate family and significant other(s) on suicide prevention, Complete Psychosocial Assessment, Interpersonal group therapy.  Evaluation of Outcomes: Progressing   Progress in Treatment: Attending groups: Yes. Participating in groups: No. Taking medication as prescribed: Yes. Toleration medication: Yes. Family/Significant other contact made: Yes, individual(s) contacted:  guardian/sister, [name] Threat[place]. Patient understands diagnosis: No. Discussing patient identified problems/goals with staff: No. Medical problems stabilized or resolved: Yes. Denies suicidal/homicidal ideation: Yes. Issues/concerns per patient self-inventory: No. Other: none.  New problem(s) identified: No, Describe:  none identified. Update 04/15/22: No changes at this time. Update 04/20/22: No changes at this time. Update 04/25/22: No changes at this time. Update 05/01/22: No changes at this time.   New Short Term/Long Term Goal(s): elimination of symptoms of psychosis, medication management for mood stabilization; elimination of SI thoughts; development of comprehensive mental wellness plan. Update 04/15/22: No changes at this time. Update 04/20/22: No changes at this time. Update 04/25/22: No changes at this time.  Update 05/01/22: No changes at this time.   Patient Goals:  "Nothing I want to work on. I want to be discharged." Update 04/15/22: No changes at this time. Update 04/20/22: No changes at this time.  Update 04/25/22: No changes at this time. Update 05/01/22: No changes at this time.   Discharge Plan or Barriers: CSW will assist pt with development of an appropriate aftercare/discharge plan. Update 04/15/22: Pt has been unwilling to engage with the treatment team around discharge. Update 04/20/22: No changes at this time.   Update 04/25/22: Patient will be returning to live with her sister. Pt will receive outpatient services through [facility]. Update 05/01/22: No changes at this time.   Reason for Continuation of Hospitalization: Aggression Medication stabilization   Estimated Length of Stay: 1-7 days Update 04/15/22: No changes at this time. Update 04/20/22: TBD Update 04/25/22: TBD Update 05/01/22: No changes at this time.  Last 3 [place] Suicide Severity Risk Score: Flowsheet Row Admission (Current) from 04/08/2022 in [facility] INPATIENT BEHAVIORAL MEDICINE Admission (Discharged) from 10/17/2021 in [facility] BEHAVIORAL MEDICINE Admission (Discharged) from 10/02/2021 in [facility] CARDIAC MED PCU  C-SSRS RISK CATEGORY No Risk No Risk No Risk       Last PHQ 2/9 Scores:     No data to display          Scribe for Treatment Team: Glenis Smoker, LCSW 05/01/2022 8:54 AM

## 2022-05-02 DIAGNOSIS — F251 Schizoaffective disorder, depressive type: Secondary | ICD-10-CM | POA: Diagnosis not present

## 2022-05-02 NOTE — Progress Notes (Signed)
[facility] MD Progress Note  05/02/2022 2:08 PM [name]  MRN:  [id number] Subjective: [name] has no complaints.  No issues.  Mood and affect are stable.  No complaints and no side effects.  Principal Problem: Schizoaffective disorder, depressive type (HCC) Diagnosis: Principal Problem:   Schizoaffective disorder, depressive type (HCC)  Total Time spent with patient: 15 minutes  Past Psychiatric History: Schizoaffective disorder  Past Medical History:  Past Medical History:  Diagnosis Date   Anemia 10/02/2021   Non compliance w medication regimen    Schizophrenia [facility])     Past Surgical History:  Procedure Laterality Date   BIOPSY  09/25/2021   Procedure: BIOPSY;  Surgeon: Rachael Fee, MD;  Location: [facility];  Service: Gastroenterology;;   ESOPHAGOGASTRODUODENOSCOPY (EGD) WITH PROPOFOL [name] 09/25/2021   Procedure: ESOPHAGOGASTRODUODENOSCOPY (EGD) WITH PROPOFOL;  Surgeon: Rachael Fee, MD;  Location: [facility];  Service: Gastroenterology;  Laterality: [name];  With NGT placement   Family History: History reviewed. No pertinent family history.  Social History:  Social History   Substance and Sexual Activity  Alcohol Use Not Currently   Comment: Refuses to disclose how much     Social History   Substance and Sexual Activity  Drug Use No   Comment: Refuses to answer    Social History   Socioeconomic History   Marital status: Single    Spouse name: Not on file   Number of children: Not on file   Years of education: Not on file   Highest education level: Not on file  Occupational History   Not on file  Tobacco Use   Smoking status: Every Day    Packs/day: 2.00    Types: Cigarettes   Smokeless tobacco: Never  Substance and Sexual Activity   Alcohol use: Not Currently    Comment: Refuses to disclose how much   Drug use: No    Comment: Refuses to answer   Sexual activity: Not Currently    Comment: refused to answer  Other Topics Concern   Not on file   Social History Narrative   Not on file   Social Determinants of Health   Financial Resource Strain: Not on file  Food Insecurity: No Food Insecurity (04/08/2022)   Hunger Vital Sign    Worried About Running Out of Food in the Last Year: Never true    Ran Out of Food in the Last Year: Never true  Transportation Needs: No Transportation Needs (04/08/2022)   PRAPARE - Administrator, Civil Service (Medical): No    Lack of Transportation (Non-Medical): No  Physical Activity: Not on file  Stress: Not on file  Social Connections: Not on file   Additional Social History:                         Sleep: Good  Appetite:  Good  Current Medications: Current Facility-Administered Medications  Medication Dose Route Frequency Provider Last Rate Last Admin   acetaminophen (TYLENOL) tablet 650 mg  650 mg Oral Q6H PRN [name], [name], MD       alum & mag hydroxide-simeth (MAALOX/MYLANTA) 200-200-20 MG/5ML suspension 30 mL  30 mL Oral Q4H PRN [name], [name], MD       feeding supplement (BOOST / RESOURCE BREEZE) liquid 1 Container  1 Container Oral QID [name], [name], MD   1 Container at 05/01/22 1241   haloperidol (HALDOL) tablet 10 mg  10 mg Oral BID [name], [name],  MD   10 mg at 05/02/22 0805   Or   haloperidol lactate (HALDOL) injection 5 mg  5 mg Intramuscular BID [name], [name], MD       LORazepam (ATIVAN) tablet 1 mg  1 mg Oral BID [name], [name], MD   1 mg at 05/02/22 1610   Or   LORazepam (ATIVAN) injection 1 mg  1 mg Intramuscular BID [name], [name], MD       magnesium hydroxide (MILK OF MAGNESIA) suspension 30 mL  30 mL Oral Daily PRN [name], [name], MD       mirtazapine (REMERON) tablet 30 mg  30 mg Oral QHS [name], [name], MD   30 mg at 05/01/22 2119   multivitamin with minerals tablet 1 tablet  1 tablet Oral Daily [name], [name], MD   1 tablet at 05/02/22 0806   OLANZapine (ZYPREXA) tablet 30 mg  30 mg Oral QHS [name], [name], MD   30 mg at 05/01/22  2119   thiamine (VITAMIN B1) tablet 100 mg  100 mg Oral Daily [name], [name], MD   100 mg at 05/02/22 9604    Lab Results: No results found for this or any previous visit (from the past 48 hour(s)).  Blood Alcohol level:  Lab Results  Component Value Date   ETH <10 03/29/2022   ETH <10 54/03/8118    Metabolic Disorder Labs: Lab Results  Component Value Date   HGBA1C 5.6 10/21/2021   MPG 114 10/21/2021   MPG 111 10/02/2021   Lab Results  Component Value Date   PROLACTIN 13.6 04/16/2015   Lab Results  Component Value Date   CHOL 189 10/21/2021   TRIG 52 10/21/2021   HDL 50 10/21/2021   CHOLHDL 3.8 10/21/2021   VLDL 10 10/21/2021   LDLCALC 129 (H) 10/21/2021   LDLCALC 93 10/01/2020    Physical Findings: AIMS: Facial and Oral Movements Muscles of Facial Expression: None, normal Lips and Perioral Area: None, normal Jaw: None, normal Tongue: None, normal,Extremity Movements Upper (arms, wrists, hands, fingers): None, normal Lower (legs, knees, ankles, toes): None, normal, Trunk Movements Neck, shoulders, hips: None, normal, Overall Severity Severity of abnormal movements (highest score from questions above): None, normal Incapacitation due to abnormal movements: None, normal Patient's awareness of abnormal movements (rate only patient's report): No Awareness, Dental Status Current problems with teeth and/or dentures?: No Does patient usually wear dentures?: No  CIWA:    COWS:     Musculoskeletal: Strength & Muscle Tone: within normal limits Gait & Station: normal Patient leans: [name]  Psychiatric Specialty Exam:  Presentation  General Appearance:  Disheveled  Eye Contact: Fair  Speech: Pressured  Speech Volume: Increased  Handedness: Right   Mood and Affect  Mood: Irritable; Labile; Angry  Affect: Blunt; Labile   Thought Process  Thought Processes: Disorganized; Irrevelant  Descriptions of  Associations:Loose  Orientation:Partial  Thought Content:Illogical; Rumination; Scattered; Tangential; Delusions  History of Schizophrenia/Schizoaffective disorder:Yes  Duration of Psychotic Symptoms:Greater than six months  Hallucinations:No data recorded Ideas of Reference:Percusatory; Paranoia  Suicidal Thoughts:No data recorded Homicidal Thoughts:No data recorded  Sensorium  Memory: Immediate Fair; Recent Fair; Remote Fair  Judgment: Impaired  Insight: Poor; Lacking   Executive Functions  Concentration: Poor  Attention Span: Poor  Recall: Poor  Fund of Knowledge: Poor  Language: Poor   Psychomotor Activity  Psychomotor Activity:No data recorded  Assets  Assets: Physical Health; Resilience; Housing; Social Support   Sleep  Sleep:No data recorded  Blood pressure 100/63, pulse (!) 56, temperature 98.4 F (36.9 C), temperature source Oral, resp. rate 18, height 5\' 4"  (1.626 m), weight 40.4 kg, SpO2 99 %. Body mass index is 15.28 kg/m.   Treatment Plan Summary: Daily contact with patient to assess and evaluate symptoms and progress in treatment, Medication management, and Plan continue current medications.  , DO 05/02/2022, 2:08 PM

## 2022-05-02 NOTE — Plan of Care (Signed)
  Problem: Coping: Goal: Level of anxiety will decrease Outcome: Progressing   Problem: Pain Managment: Goal: General experience of comfort will improve Outcome: Progressing   Problem: Activity: Goal: Will verbalize the importance of balancing activity with adequate rest periods Outcome: Progressing   Problem: Coping: Goal: Will verbalize feelings Outcome: Not Progressing

## 2022-05-02 NOTE — Progress Notes (Signed)
Patient alert and oriented x 4 affect is flat but brightens upon approach she denies SI/HI/AVH she appears less anxious and is interacting appropriately with peers and staff. Patient took her nighttime medications exception of her nutrition shake  and stated "I don't want it" 15 minutes safety checks maintained she remains safe at this time.

## 2022-05-02 NOTE — Progress Notes (Signed)
Patient alert and orient x4. Flat on approach but brightens. Denies SI, HI, AVh. Medication compliant. Minimal engagement with staff only answers questions and stares. No engagement with peers. Isolates to her room.  Encouragement and support provided. Safety checks maintained. Medications given as prescribed. Pt receptive and remains safe on unit with q 15 min checks.

## 2022-05-03 DIAGNOSIS — F251 Schizoaffective disorder, depressive type: Secondary | ICD-10-CM | POA: Diagnosis not present

## 2022-05-03 NOTE — Group Note (Signed)
[facility] LCSW Group Therapy Note   Group Date: 05/03/2022 Start Time: 1300 End Time: 1400   Type of Therapy and Topic: Group Therapy: Avoiding Self-Sabotaging and Enabling Behaviors  Participation Level: Minimal  Mood: Depressed   Description of Group:  In this group, patients will learn how to identify obstacles, self-sabotaging and enabling behaviors, as well as: what are they, why do we do them and what needs these behaviors meet. Discuss unhealthy relationships and how to have positive healthy boundaries with those that sabotage and enable. Explore aspects of self-sabotage and enabling in yourself and how to limit these self-destructive behaviors in everyday life.   Therapeutic Goals: 1. Patient will identify one obstacle that relates to self-sabotage and enabling behaviors 2. Patient will identify one personal self-sabotaging or enabling behavior they did prior to admission 3. Patient will state a plan to change the above identified behavior 4. Patient will demonstrate ability to communicate their needs through discussion and/or role play.    Summary of Patient Progress:   Patient was present for the entirety of group session. Patient participated in opening and closing remarks. However, patient did not contribute at all to the topic of discussion despite encouraged participation.    Therapeutic Modalities:  Cognitive Behavioral Therapy Person-Centered Therapy Motivational Interviewing    Durenda Hurt, [place]

## 2022-05-03 NOTE — Progress Notes (Signed)
Condition unchanged patient is alert and oriented x 4 denies SI/HI/AVH, compliant with medication. 15 minutes safety checks maintained.

## 2022-05-03 NOTE — Plan of Care (Signed)
  Problem: Education: Goal: Knowledge of General Education information will improve Description: Including pain rating scale, medication(s)/side effects and non-pharmacologic comfort measures Outcome: Progressing   Problem: Activity: Goal: Risk for activity intolerance will decrease Outcome: Progressing   Problem: Pain Managment: Goal: General experience of comfort will improve Outcome: Progressing  Patient pleasant and cooperative with assessment.  Patient is medication compliant but refuses supplement.  Patient denies AVH, SI/HI, anxiety, depression and pain .  Patient is visible on unit , in dayroom but isolates to self.  Patient can contract for safety. Q 15 minute rounds in progress, will continue to monitor

## 2022-05-03 NOTE — Progress Notes (Signed)
[name] Progress Note  05/03/2022 11:14 AM [name]  MRN:  [id number] Subjective: No changes with Geanine.  She is pleasant and cooperative.  She has been compliant with her medicine.  No side effects.  She does not have any complaints.  Mood and affect are stable.  Principal Problem: Schizoaffective disorder, depressive type (HCC) Diagnosis: Principal Problem:   Schizoaffective disorder, depressive type (HCC)  Total Time spent with patient: 1 hour  Past Psychiatric History: Schizoaffective disorder  Past Medical History:  Past Medical History:  Diagnosis Date   Anemia 10/02/2021   Non compliance w medication regimen    Schizophrenia [facility])     Past Surgical History:  Procedure Laterality Date   BIOPSY  09/25/2021   Procedure: BIOPSY;  Surgeon: Rachael Fee, MD;  Location: [facility];  Service: Gastroenterology;;   ESOPHAGOGASTRODUODENOSCOPY (EGD) WITH PROPOFOL [name] 09/25/2021   Procedure: ESOPHAGOGASTRODUODENOSCOPY (EGD) WITH PROPOFOL;  Surgeon: Rachael Fee, MD;  Location: [facility];  Service: Gastroenterology;  Laterality: [name];  With NGT placement   Family History: History reviewed. No pertinent family history.  Social History:  Social History   Substance and Sexual Activity  Alcohol Use Not Currently   Comment: Refuses to disclose how much     Social History   Substance and Sexual Activity  Drug Use No   Comment: Refuses to answer    Social History   Socioeconomic History   Marital status: Single    Spouse name: Not on file   Number of children: Not on file   Years of education: Not on file   Highest education level: Not on file  Occupational History   Not on file  Tobacco Use   Smoking status: Every Day    Packs/day: 2.00    Types: Cigarettes   Smokeless tobacco: Never  Substance and Sexual Activity   Alcohol use: Not Currently    Comment: Refuses to disclose how much   Drug use: No    Comment: Refuses to answer   Sexual activity: Not Currently     Comment: refused to answer  Other Topics Concern   Not on file  Social History Narrative   Not on file   Social Determinants of Health   Financial Resource Strain: Not on file  Food Insecurity: No Food Insecurity (04/08/2022)   Hunger Vital Sign    Worried About Running Out of Food in the Last Year: Never true    Ran Out of Food in the Last Year: Never true  Transportation Needs: No Transportation Needs (04/08/2022)   PRAPARE - Administrator, Civil Service (Medical): No    Lack of Transportation (Non-Medical): No  Physical Activity: Not on file  Stress: Not on file  Social Connections: Not on file   Additional Social History:                         Sleep: Good  Appetite:  Good  Current Medications: Current Facility-Administered Medications  Medication Dose Route Frequency Provider Last Rate Last Admin   acetaminophen (TYLENOL) tablet 650 mg  650 mg Oral Q6H PRN [name], [name], MD       alum & mag hydroxide-simeth (MAALOX/MYLANTA) 200-200-20 MG/5ML suspension 30 mL  30 mL Oral Q4H PRN [name], [name], MD       feeding supplement (BOOST / RESOURCE BREEZE) liquid 1 Container  1 Container Oral QID [name], [name], MD   1 Container at 05/01/22 1241  haloperidol (HALDOL) tablet 10 mg  10 mg Oral BID [name], [name], MD   10 mg at 05/03/22 0825   Or   haloperidol lactate (HALDOL) injection 5 mg  5 mg Intramuscular BID [name], [name], MD       LORazepam (ATIVAN) tablet 1 mg  1 mg Oral BID [name], [name], MD   1 mg at 05/03/22 0825   Or   LORazepam (ATIVAN) injection 1 mg  1 mg Intramuscular BID [name], [name], MD       magnesium hydroxide (MILK OF MAGNESIA) suspension 30 mL  30 mL Oral Daily PRN [name], [name], MD       mirtazapine (REMERON) tablet 30 mg  30 mg Oral QHS [name], [name], MD   30 mg at 05/02/22 2119   multivitamin with minerals tablet 1 tablet  1 tablet Oral Daily [name], [name], MD   1 tablet at 05/03/22 0825   OLANZapine (ZYPREXA)  tablet 30 mg  30 mg Oral QHS [name], [name], MD   30 mg at 05/02/22 2119   thiamine (VITAMIN B1) tablet 100 mg  100 mg Oral Daily [name], [name], MD   100 mg at 05/03/22 0825    Lab Results: No results found for this or any previous visit (from the past 48 hour(s)).  Blood Alcohol level:  Lab Results  Component Value Date   ETH <10 03/29/2022   ETH <10 09/12/2021    Metabolic Disorder Labs: Lab Results  Component Value Date   HGBA1C 5.6 10/21/2021   MPG 114 10/21/2021   MPG 111 10/02/2021   Lab Results  Component Value Date   PROLACTIN 13.6 04/16/2015   Lab Results  Component Value Date   CHOL 189 10/21/2021   TRIG 52 10/21/2021   HDL 50 10/21/2021   CHOLHDL 3.8 10/21/2021   VLDL 10 10/21/2021   LDLCALC 129 (H) 10/21/2021   LDLCALC 93 10/01/2020    Physical Findings: AIMS: Facial and Oral Movements Muscles of Facial Expression: None, normal Lips and Perioral Area: None, normal Jaw: None, normal Tongue: None, normal,Extremity Movements Upper (arms, wrists, hands, fingers): None, normal Lower (legs, knees, ankles, toes): None, normal, Trunk Movements Neck, shoulders, hips: None, normal, Overall Severity Severity of abnormal movements (highest score from questions above): None, normal Incapacitation due to abnormal movements: None, normal Patient's awareness of abnormal movements (rate only patient's report): No Awareness, Dental Status Current problems with teeth and/or dentures?: No Does patient usually wear dentures?: No  CIWA:    COWS:     Musculoskeletal: Strength & Muscle Tone: within normal limits Gait & Station: normal Patient leans: [name]  Psychiatric Specialty Exam:  Presentation  General Appearance:  Disheveled  Eye Contact: Fair  Speech: Pressured  Speech Volume: Increased  Handedness: Right   Mood and Affect  Mood: Irritable; Labile; Angry  Affect: Blunt; Labile   Thought Process  Thought Processes: Disorganized;  Irrevelant  Descriptions of Associations:Loose  Orientation:Partial  Thought Content:Illogical; Rumination; Scattered; Tangential; Delusions  History of Schizophrenia/Schizoaffective disorder:Yes  Duration of Psychotic Symptoms:Greater than six months  Hallucinations:No data recorded Ideas of Reference:Percusatory; Paranoia  Suicidal Thoughts:No data recorded Homicidal Thoughts:No data recorded  Sensorium  Memory: Immediate Fair; Recent Fair; Remote Fair  Judgment: Impaired  Insight: Poor; Lacking   Executive Functions  Concentration: Poor  Attention Span: Poor  Recall: Poor  Fund of Knowledge: Poor  Language: Poor   Psychomotor Activity  Psychomotor Activity:No data recorded  Assets  Assets: Physical  Health; Resilience; Housing; Social Support   Sleep  Sleep:No data recorded    Blood pressure 105/67, pulse (!) 57, temperature 98.5 F (36.9 C), temperature source Oral, resp. rate 18, height 5\' 4"  (1.626 m), weight 40.4 kg, SpO2 99 %. Body mass index is 15.28 kg/m.   Treatment Plan Summary: Daily contact with patient to assess and evaluate symptoms and progress in treatment, Medication management, and Plan continue current medications.  Parks Ranger, DO 05/03/2022, 11:14 AM

## 2022-05-04 DIAGNOSIS — F251 Schizoaffective disorder, depressive type: Secondary | ICD-10-CM | POA: Diagnosis not present

## 2022-05-04 NOTE — Group Note (Signed)
[facility] LCSW Group Therapy Note    Group Date: 05/04/2022 Start Time: 1300 End Time: 1400  Type of Therapy and Topic:  Group Therapy:  Overcoming Obstacles  Participation Level:  [facility] PARTICIPATION LEVEL: Minimal   Description of Group:   In this group patients will be encouraged to explore what they see as obstacles to their own wellness and recovery. They will be guided to discuss their thoughts, feelings, and behaviors related to these obstacles. The group will process together ways to cope with barriers, with attention given to specific choices patients can make. Each patient will be challenged to identify changes they are motivated to make in order to overcome their obstacles. This group will be process-oriented, with patients participating in exploration of their own experiences as well as giving and receiving support and challenge from other group members.  Therapeutic Goals: 1. Patient will identify personal and current obstacles as they relate to admission. 2. Patient will identify barriers that currently interfere with their wellness or overcoming obstacles.  3. Patient will identify feelings, thought process and behaviors related to these barriers. 4. Patient will identify two changes they are willing to make to overcome these obstacles:    Summary of Patient Progress Patient was present for the entirety of the group process. She participated in the icebreaker and then towards the end of group. Pt does not appear to have insight into the discussion.   Therapeutic Modalities:   Cognitive Behavioral Therapy Solution Focused Therapy Motivational Interviewing Relapse Prevention Therapy   Shirl Harris, LCSW

## 2022-05-04 NOTE — Progress Notes (Signed)
Pt is pleasant upon approach, she is med compliant, still forwards little but able to let her needs known. She denied any SI/HI/AVH or self harm thoughts/intent. Reported that "I just want to go home." She eating 100% of her meals and hydrating well but has refused her supplements thus far.  She denied any pain /discomfort. No falls or unsafe behavior noted thus far. Q15 min observations maintained for safety and support provided as needed.

## 2022-05-04 NOTE — Progress Notes (Signed)
[facility] MD Progress Note  05/04/2022 5:39 PM [name]  MRN:  [id number] Subjective: Continues to be withdrawn and negativistic.  Does not interact much with others.  On approach will only say she wants to go home.  Once again when any effort is made to discuss her family's opinion of this she loses her temper and refuses to engage in further conversation.  Has refused repeatedly to consider ECT Principal Problem: Schizoaffective disorder, depressive type (HCC) Diagnosis: Principal Problem:   Schizoaffective disorder, depressive type (HCC)  Total Time spent with patient: 30 minutes  Past Psychiatric History: Past history of schizophrenia  Past Medical History:  Past Medical History:  Diagnosis Date   Anemia 10/02/2021   Non compliance w medication regimen    Schizophrenia [facility])     Past Surgical History:  Procedure Laterality Date   BIOPSY  09/25/2021   Procedure: BIOPSY;  Surgeon: Rachael Fee, MD;  Location: [facility];  Service: Gastroenterology;;   ESOPHAGOGASTRODUODENOSCOPY (EGD) WITH PROPOFOL [name] 09/25/2021   Procedure: ESOPHAGOGASTRODUODENOSCOPY (EGD) WITH PROPOFOL;  Surgeon: Rachael Fee, MD;  Location: [facility];  Service: Gastroenterology;  Laterality: [name];  With NGT placement   Family History: History reviewed. No pertinent family history. Family Psychiatric  History: See previous Social History:  Social History   Substance and Sexual Activity  Alcohol Use Not Currently   Comment: Refuses to disclose how much     Social History   Substance and Sexual Activity  Drug Use No   Comment: Refuses to answer    Social History   Socioeconomic History   Marital status: Single    Spouse name: Not on file   Number of children: Not on file   Years of education: Not on file   Highest education level: Not on file  Occupational History   Not on file  Tobacco Use   Smoking status: Every Day    Packs/day: 2.00    Types: Cigarettes   Smokeless tobacco: Never   Substance and Sexual Activity   Alcohol use: Not Currently    Comment: Refuses to disclose how much   Drug use: No    Comment: Refuses to answer   Sexual activity: Not Currently    Comment: refused to answer  Other Topics Concern   Not on file  Social History Narrative   Not on file   Social Determinants of Health   Financial Resource Strain: Not on file  Food Insecurity: No Food Insecurity (04/08/2022)   Hunger Vital Sign    Worried About Running Out of Food in the Last Year: Never true    Ran Out of Food in the Last Year: Never true  Transportation Needs: No Transportation Needs (04/08/2022)   PRAPARE - Administrator, Civil Service (Medical): No    Lack of Transportation (Non-Medical): No  Physical Activity: Not on file  Stress: Not on file  Social Connections: Not on file   Additional Social History:                         Sleep: Fair  Appetite:  Fair  Current Medications: Current Facility-Administered Medications  Medication Dose Route Frequency Provider Last Rate Last Admin   acetaminophen (TYLENOL) tablet 650 mg  650 mg Oral Q6H PRN Rechelle Niebla T, MD       alum & mag hydroxide-simeth (MAALOX/MYLANTA) 200-200-20 MG/5ML suspension 30 mL  30 mL Oral Q4H PRN Alicea Wente, [name], MD  feeding supplement (BOOST / RESOURCE BREEZE) liquid 1 Container  1 Container Oral QID Fortunato Nordin, [name], MD   1 Container at 05/04/22 0802   haloperidol (HALDOL) tablet 10 mg  10 mg Oral BID Sherrill Buikema T, MD   10 mg at 05/04/22 1702   Or   haloperidol lactate (HALDOL) injection 5 mg  5 mg Intramuscular BID Makiah Clauson, [name], MD       LORazepam (ATIVAN) tablet 1 mg  1 mg Oral BID Isabella [name] T, MD   1 mg at 05/04/22 1702   Or   LORazepam (ATIVAN) injection 1 mg  1 mg Intramuscular BID Skylen Danielsen T, MD       magnesium hydroxide (MILK OF MAGNESIA) suspension 30 mL  30 mL Oral Daily PRN Julyssa Kyer T, MD       mirtazapine (REMERON) tablet 30 mg  30 mg Oral QHS  Lurine Imel T, MD   30 mg at 05/03/22 2104   multivitamin with minerals tablet 1 tablet  1 tablet Oral Daily Mariadelaluz Guggenheim, [name], MD   1 tablet at 05/04/22 0801   OLANZapine (ZYPREXA) tablet 30 mg  30 mg Oral QHS Deneisha Dade T, MD   30 mg at 05/03/22 2104   thiamine (VITAMIN B1) tablet 100 mg  100 mg Oral Daily Naim Murtha, [name], MD   100 mg at 05/04/22 1025    Lab Results: No results found for this or any previous visit (from the past 48 hour(s)).  Blood Alcohol level:  Lab Results  Component Value Date   ETH <10 03/29/2022   ETH <10 09/12/2021    Metabolic Disorder Labs: Lab Results  Component Value Date   HGBA1C 5.6 10/21/2021   MPG 114 10/21/2021   MPG 111 10/02/2021   Lab Results  Component Value Date   PROLACTIN 13.6 04/16/2015   Lab Results  Component Value Date   CHOL 189 10/21/2021   TRIG 52 10/21/2021   HDL 50 10/21/2021   CHOLHDL 3.8 10/21/2021   VLDL 10 10/21/2021   LDLCALC 129 (H) 10/21/2021   LDLCALC 93 10/01/2020    Physical Findings: AIMS: Facial and Oral Movements Muscles of Facial Expression: None, normal Lips and Perioral Area: None, normal Jaw: None, normal Tongue: None, normal,Extremity Movements Upper (arms, wrists, hands, fingers): None, normal Lower (legs, knees, ankles, toes): None, normal, Trunk Movements Neck, shoulders, hips: None, normal, Overall Severity Severity of abnormal movements (highest score from questions above): None, normal Incapacitation due to abnormal movements: None, normal Patient's awareness of abnormal movements (rate only patient's report): No Awareness, Dental Status Current problems with teeth and/or dentures?: No Does patient usually wear dentures?: No  CIWA:    COWS:     Musculoskeletal: Strength & Muscle Tone: within normal limits Gait & Station: normal Patient leans: [name]  Psychiatric Specialty Exam:  Presentation  General Appearance:  Disheveled  Eye Contact: Fair  Speech: Pressured  Speech  Volume: Increased  Handedness: Right   Mood and Affect  Mood: Irritable; Labile; Angry  Affect: Blunt; Labile   Thought Process  Thought Processes: Disorganized; Irrevelant  Descriptions of Associations:Loose  Orientation:Partial  Thought Content:Illogical; Rumination; Scattered; Tangential; Delusions  History of Schizophrenia/Schizoaffective disorder:Yes  Duration of Psychotic Symptoms:Greater than six months  Hallucinations:No data recorded Ideas of Reference:Percusatory; Paranoia  Suicidal Thoughts:No data recorded Homicidal Thoughts:No data recorded  Sensorium  Memory: Immediate Fair; Recent Fair; Remote Fair  Judgment: Impaired  Insight: Poor; Lacking   Executive Functions  Concentration: Poor  Attention  Span: Poor  Recall: Poor  Fund of Knowledge: Poor  Language: Poor   Psychomotor Activity  Psychomotor Activity:No data recorded  Assets  Assets: Physical Health; Resilience; Housing; Social Support   Sleep  Sleep:No data recorded   Physical Exam: Physical Exam Vitals and nursing note reviewed.  Constitutional:      Appearance: Normal appearance.  HENT:     Head: Normocephalic and atraumatic.     Mouth/Throat:     Pharynx: Oropharynx is clear.  Eyes:     Pupils: Pupils are equal, round, and reactive to light.  Cardiovascular:     Rate and Rhythm: Normal rate and regular rhythm.  Pulmonary:     Effort: Pulmonary effort is normal.     Breath sounds: Normal breath sounds.  Abdominal:     General: Abdomen is flat.     Palpations: Abdomen is soft.  Musculoskeletal:        General: Normal range of motion.  Skin:    General: Skin is warm and dry.  Neurological:     General: No focal deficit present.     Mental Status: She is alert. Mental status is at baseline.  Psychiatric:        Attention and Perception: Attention normal.        Mood and Affect: Mood normal. Affect is blunt.        Speech: She is noncommunicative.         Behavior: Behavior is withdrawn.        Thought Content: Thought content is paranoid.    Review of Systems  Constitutional: Negative.   HENT: Negative.    Eyes: Negative.   Respiratory: Negative.    Cardiovascular: Negative.   Gastrointestinal: Negative.   Musculoskeletal: Negative.   Skin: Negative.   Neurological: Negative.   Psychiatric/Behavioral: Negative.     Blood pressure (!) 108/47, pulse (!) 57, temperature 98.8 F (37.1 C), temperature source Oral, resp. rate 18, height 5\' 4"  (1.626 m), weight 40.4 kg, SpO2 100 %. Body mass index is 15.28 kg/m.   Treatment Plan Summary: Plan no change to medication.  Already on high doses of antipsychotics and on an antidepressant.  Refusing ECT.  May be near her current baseline which unfortunately is unacceptable for living at home with her family.  Alethia Berthold, MD 05/04/2022, 5:39 PM

## 2022-05-04 NOTE — Progress Notes (Signed)
Recreation Therapy Notes    Date: 05/04/2022  Time: 10:45 am   Location: Craft room       Behavioral response: [name]   Intervention Topic: Values   Discussion/Intervention: Patient refused to attend group.   Clinical Observations/Feedback:  Patient refused to attend group.    Alexina Niccoli LRT/CTRS        Pricsilla Lindvall 05/04/2022 11:21 AM

## 2022-05-05 DIAGNOSIS — F251 Schizoaffective disorder, depressive type: Secondary | ICD-10-CM | POA: Diagnosis not present

## 2022-05-05 NOTE — Plan of Care (Signed)
  Problem: Education: Goal: Knowledge of [facility] Education information/materials will improve Outcome: Progressing Goal: Emotional status will improve Outcome: Progressing Goal: Mental status will improve Outcome: Progressing   Problem: Education: Goal: Emotional status will improve Outcome: Progressing   Problem: Education: Goal: Mental status will improve Outcome: Progressing

## 2022-05-05 NOTE — Progress Notes (Signed)
Pt denies SI/HI/AVH and verbally agrees to approach staff if these become apparent or before harming themselves/others. Rates depression 0/10. Rates anxiety 0/10. Rates pain 0/10.  Pt has been in the dayroom a few times. Pt went to one group. Scheduled medications administered to pt, per MD orders. RN provided support and encouragement to pt. Q15 min safety checks implemented and continued. Pt safe on the unit. RN will continue to monitor and intervene as needed.  Problem: Coping: Goal: Will verbalize feelings Outcome: Progressing   Problem: Health Behavior/Discharge Planning: Goal: Compliance with prescribed medication regimen will improve Outcome: Progressing    05/05/22 0751  Psych Admission Type (Psych Patients Only)  Admission Status Involuntary  Psychosocial Assessment  Patient Complaints None  Eye Contact Brief  Facial Expression Sad;Flat  Affect Blunted  Speech Logical/coherent  Interaction Forwards little;Guarded;Minimal  Motor Activity Slow  Appearance/Hygiene In scrubs;Unremarkable  Behavior Characteristics Cooperative;Unable to participate;Calm  Mood Pleasant  Thought Process  Coherency WDL  Content WDL  Delusions None reported or observed  Perception WDL  Hallucination None reported or observed  Judgment Limited  Confusion None  Danger to Self  Current suicidal ideation? Denies  Danger to Others  Danger to Others None reported or observed

## 2022-05-05 NOTE — Progress Notes (Signed)
[facility] MD Progress Note  05/05/2022 4:12 PM [name]  MRN:  [id number] Subjective: Patient with no complaints.  No change to behavior.  Still paranoid and psychotic and unwilling to discuss appropriate discharge planning Principal Problem: Schizoaffective disorder, depressive type ([place]) Diagnosis: Principal Problem:   Schizoaffective disorder, depressive type ([place])  Total Time spent with patient: 30 minutes  Past Psychiatric History: Past history of schizophrenia  Past Medical History:  Past Medical History:  Diagnosis Date   Anemia 10/02/2021   Non compliance w medication regimen    Schizophrenia [facility])     Past Surgical History:  Procedure Laterality Date   BIOPSY  09/25/2021   Procedure: BIOPSY;  Surgeon: Milus Banister, MD;  Location: [facility];  Service: Gastroenterology;;   ESOPHAGOGASTRODUODENOSCOPY (EGD) WITH PROPOFOL [name] 09/25/2021   Procedure: ESOPHAGOGASTRODUODENOSCOPY (EGD) WITH PROPOFOL;  Surgeon: Milus Banister, MD;  Location: [facility];  Service: Gastroenterology;  Laterality: [name];  With NGT placement   Family History: History reviewed. No pertinent family history. Family Psychiatric  History: See previous Social History:  Social History   Substance and Sexual Activity  Alcohol Use Not Currently   Comment: Refuses to disclose how much     Social History   Substance and Sexual Activity  Drug Use No   Comment: Refuses to answer    Social History   Socioeconomic History   Marital status: Single    Spouse name: Not on file   Number of children: Not on file   Years of education: Not on file   Highest education level: Not on file  Occupational History   Not on file  Tobacco Use   Smoking status: Every Day    Packs/day: 2.00    Types: Cigarettes   Smokeless tobacco: Never  Substance and Sexual Activity   Alcohol use: Not Currently    Comment: Refuses to disclose how much   Drug use: No    Comment: Refuses to answer   Sexual activity: Not  Currently    Comment: refused to answer  Other Topics Concern   Not on file  Social History Narrative   Not on file   Social Determinants of Health   Financial Resource Strain: Not on file  Food Insecurity: No Food Insecurity (04/08/2022)   Hunger Vital Sign    Worried About Running Out of Food in the Last Year: Never true    Ran Out of Food in the Last Year: Never true  Transportation Needs: No Transportation Needs (04/08/2022)   PRAPARE - Hydrologist (Medical): No    Lack of Transportation (Non-Medical): No  Physical Activity: Not on file  Stress: Not on file  Social Connections: Not on file   Additional Social History:                         Sleep: Fair  Appetite:  Fair  Current Medications: Current Facility-Administered Medications  Medication Dose Route Frequency Provider Last Rate Last Admin   acetaminophen (TYLENOL) tablet 650 mg  650 mg Oral Q6H PRN Sharai Overbay T, MD       alum & mag hydroxide-simeth (MAALOX/MYLANTA) 200-200-20 MG/5ML suspension 30 mL  30 mL Oral Q4H PRN Colin Ellers, [name], MD       feeding supplement (BOOST / RESOURCE BREEZE) liquid 1 Container  1 Container Oral QID Chloris Marcoux, [name], MD   1 Container at 05/04/22 0802   haloperidol (HALDOL) tablet 10 mg  10 mg Oral BID Namir Neto T, MD   10 mg at 05/05/22 [id number]   Or   haloperidol lactate (HALDOL) injection 5 mg  5 mg Intramuscular BID Lekisha Mcghee, [name], MD       LORazepam (ATIVAN) tablet 1 mg  1 mg Oral BID Morna Flud T, MD   1 mg at 05/05/22 [id number]   Or   LORazepam (ATIVAN) injection 1 mg  1 mg Intramuscular BID Daneka Lantigua T, MD       magnesium hydroxide (MILK OF MAGNESIA) suspension 30 mL  30 mL Oral Daily PRN Marwin Primmer T, MD       mirtazapine (REMERON) tablet 30 mg  30 mg Oral QHS Khaden Gater T, MD   30 mg at 05/04/22 2106   multivitamin with minerals tablet 1 tablet  1 tablet Oral Daily Keaun Schnabel, [name], MD   1 tablet at 05/05/22 0751   OLANZapine  (ZYPREXA) tablet 30 mg  30 mg Oral QHS Krithi Bray T, MD   30 mg at 05/04/22 2107   thiamine (VITAMIN B1) tablet 100 mg  100 mg Oral Daily Diany Formosa, [name], MD   100 mg at 05/05/22 0751    Lab Results: No results found for this or any previous visit (from the past 48 hour(s)).  Blood Alcohol level:  Lab Results  Component Value Date   ETH <10 03/29/2022   ETH <10 [id number]    Metabolic Disorder Labs: Lab Results  Component Value Date   HGBA1C 5.6 10/21/2021   MPG 114 10/21/2021   MPG 111 10/02/2021   Lab Results  Component Value Date   PROLACTIN 13.6 04/16/2015   Lab Results  Component Value Date   CHOL 189 10/21/2021   TRIG 52 10/21/2021   HDL 50 10/21/2021   CHOLHDL 3.8 10/21/2021   VLDL 10 10/21/2021   LDLCALC 129 (H) 10/21/2021   LDLCALC 93 10/01/2020    Physical Findings: AIMS: Facial and Oral Movements Muscles of Facial Expression: None, normal Lips and Perioral Area: None, normal Jaw: None, normal Tongue: None, normal,Extremity Movements Upper (arms, wrists, hands, fingers): None, normal Lower (legs, knees, ankles, toes): None, normal, Trunk Movements Neck, shoulders, hips: None, normal, Overall Severity Severity of abnormal movements (highest score from questions above): None, normal Incapacitation due to abnormal movements: None, normal Patient's awareness of abnormal movements (rate only patient's report): No Awareness, Dental Status Current problems with teeth and/or dentures?: No Does patient usually wear dentures?: No  CIWA:    COWS:     Musculoskeletal: Strength & Muscle Tone: within normal limits Gait & Station: normal Patient leans: [name]  Psychiatric Specialty Exam:  Presentation  General Appearance:  Disheveled  Eye Contact: Fair  Speech: Pressured  Speech Volume: Increased  Handedness: Right   Mood and Affect  Mood: Irritable; Labile; Angry  Affect: Blunt; Labile   Thought Process  Thought  Processes: Disorganized; Irrevelant  Descriptions of Associations:Loose  Orientation:Partial  Thought Content:Illogical; Rumination; Scattered; Tangential; Delusions  History of Schizophrenia/Schizoaffective disorder:Yes  Duration of Psychotic Symptoms:Greater than six months  Hallucinations:No data recorded Ideas of Reference:Percusatory; Paranoia  Suicidal Thoughts:No data recorded Homicidal Thoughts:No data recorded  Sensorium  Memory: Immediate Fair; Recent Fair; Remote Fair  Judgment: Impaired  Insight: Poor; Lacking   Executive Functions  Concentration: Poor  Attention Span: Poor  Recall: Poor  Fund of Knowledge: Poor  Language: Poor   Psychomotor Activity  Psychomotor Activity:No data recorded  Assets  Assets: Physical Health; Resilience; Housing; Social Support  Sleep  Sleep:No data recorded   Physical Exam: Physical Exam Vitals reviewed.  Constitutional:      Appearance: Normal appearance.  HENT:     Head: Normocephalic and atraumatic.     Mouth/Throat:     Pharynx: Oropharynx is clear.  Eyes:     Pupils: Pupils are equal, round, and reactive to light.  Cardiovascular:     Rate and Rhythm: Normal rate and regular rhythm.  Pulmonary:     Effort: Pulmonary effort is normal.     Breath sounds: Normal breath sounds.  Abdominal:     General: Abdomen is flat.     Palpations: Abdomen is soft.  Musculoskeletal:        General: Normal range of motion.  Skin:    General: Skin is warm and dry.  Neurological:     General: No focal deficit present.     Mental Status: She is alert. Mental status is at baseline.  Psychiatric:        Attention and Perception: She is inattentive.        Mood and Affect: Mood normal. Affect is blunt.        Speech: Speech is delayed.        Behavior: Behavior is slowed.        Thought Content: Thought content is paranoid and delusional.        Cognition and Memory: Cognition is impaired.    Review  of Systems  Constitutional: Negative.   HENT: Negative.    Eyes: Negative.   Respiratory: Negative.    Cardiovascular: Negative.   Gastrointestinal: Negative.   Musculoskeletal: Negative.   Skin: Negative.   Neurological: Negative.   Psychiatric/Behavioral: Negative.     Blood pressure 98/60, pulse (!) 58, temperature 98.2 F (36.8 C), temperature source Oral, resp. rate 18, height 5\' 4"  (1.626 m), weight 40.4 kg, SpO2 100 %. Body mass index is 15.28 kg/m.   Treatment Plan Summary: Plan no change to current treatment plan.  Already on 2 antipsychotics and an antidepressant.  Questionable whether this is going to improve any.  Not currently safe to go home.  Not cooperative with any other kind of discharge plan.  Alethia Berthold, MD 05/05/2022, 4:12 PM

## 2022-05-05 NOTE — Progress Notes (Signed)
Patient pleasant with flat affect. No complaints or concerns voiced. Denies SI, Hi, AVH. Isolates to room most of shift. Minimal interaction with staff and peers. Out of room for snack and meds. Encouragement and support provided. Patient remains safe on unit with q 15 min checks.

## 2022-05-05 NOTE — BH IP Treatment Plan (Signed)
Interdisciplinary Treatment and Diagnostic Plan Update  05/05/2022 Time of Session: 08:30 [name] MRN: [id number]  Principal Diagnosis: Schizoaffective disorder, depressive type ([place])  Secondary Diagnoses: Principal Problem:   Schizoaffective disorder, depressive type ([place])   Current Medications:  Current Facility-Administered Medications  Medication Dose Route Frequency Provider Last Rate Last Admin   acetaminophen (TYLENOL) tablet 650 mg  650 mg Oral Q6H PRN [name], [name], MD       alum & mag hydroxide-simeth (MAALOX/MYLANTA) 200-200-20 MG/5ML suspension 30 mL  30 mL Oral Q4H PRN [name], [name], MD       feeding supplement (BOOST / RESOURCE BREEZE) liquid 1 Container  1 Container Oral QID [name], [name], MD   1 Container at 05/04/22 0802   haloperidol (HALDOL) tablet 10 mg  10 mg Oral BID [name], [name], MD   10 mg at 05/05/22 0751   Or   haloperidol lactate (HALDOL) injection 5 mg  5 mg Intramuscular BID [name], [name], MD       LORazepam (ATIVAN) tablet 1 mg  1 mg Oral BID [name], [name], MD   1 mg at 05/05/22 6222   Or   LORazepam (ATIVAN) injection 1 mg  1 mg Intramuscular BID [name], [name], MD       magnesium hydroxide (MILK OF MAGNESIA) suspension 30 mL  30 mL Oral Daily PRN [name], [name], MD       mirtazapine (REMERON) tablet 30 mg  30 mg Oral QHS [name], [name], MD   30 mg at 05/04/22 2106   multivitamin with minerals tablet 1 tablet  1 tablet Oral Daily [name], [name], MD   1 tablet at 05/05/22 0751   OLANZapine (ZYPREXA) tablet 30 mg  30 mg Oral QHS [name], [name], MD   30 mg at 05/04/22 2107   thiamine (VITAMIN B1) tablet 100 mg  100 mg Oral Daily [name], [name], MD   100 mg at 05/05/22 0751   PTA Medications: Medications Prior to Admission  Medication Sig Dispense Refill Last Dose   clonazePAM (KLONOPIN) 0.5 MG tablet Take 0.5 tablets (0.25 mg total) by mouth 2 (two) times daily. (Patient not taking: Reported on 03/30/2022) 60 tablet 0    haloperidol  (HALDOL) 5 MG tablet Take 1 tablet (5 mg total) by mouth 2 (two) times daily. (Patient not taking: Reported on 03/30/2022) 60 tablet 3    midodrine (PROAMATINE) 2.5 MG tablet Take 1 tablet (2.5 mg total) by mouth 3 (three) times daily with meals. (Patient not taking: Reported on 03/30/2022)  0    mirtazapine (REMERON) 15 MG tablet Take 0.5 tablets (7.5 mg total) by mouth at bedtime. (Patient not taking: Reported on 03/30/2022) 30 tablet 2    Multiple Vitamin (MULTIVITAMIN WITH MINERALS) TABS tablet Take 1 tablet by mouth daily. (Patient not taking: Reported on 03/30/2022)      OLANZapine (ZYPREXA) 5 MG tablet Take 1 tablet (5 mg total) by mouth at bedtime. (Patient not taking: Reported on 03/30/2022) 30 tablet 2    thiamine 100 MG tablet Take 1 tablet (100 mg total) by mouth daily. (Patient not taking: Reported on 03/30/2022)       Patient Stressors: Marital or family conflict    Patient Strengths: Supportive family/friends   Treatment Modalities: Medication Management, Group therapy, Case management,  1 to 1 session with clinician, Psychoeducation, Recreational therapy.   Physician Treatment Plan for Primary Diagnosis: Schizoaffective disorder, depressive type ([place]) Long Term Goal(s): Improvement in symptoms  so as ready for discharge   Short Term Goals: Compliance with prescribed medications will improve Ability to verbalize feelings will improve Ability to demonstrate self-control will improve Ability to identify and develop effective coping behaviors will improve  Medication Management: Evaluate patient's response, side effects, and tolerance of medication regimen.  Therapeutic Interventions: 1 to 1 sessions, Unit Group sessions and Medication administration.  Evaluation of Outcomes: Progressing  Physician Treatment Plan for Secondary Diagnosis: Principal Problem:   Schizoaffective disorder, depressive type ([facility])  Long Term Goal(s): Improvement in symptoms so as ready for discharge    Short Term Goals: Compliance with prescribed medications will improve Ability to verbalize feelings will improve Ability to demonstrate self-control will improve Ability to identify and develop effective coping behaviors will improve     Medication Management: Evaluate patient's response, side effects, and tolerance of medication regimen.  Therapeutic Interventions: 1 to 1 sessions, Unit Group sessions and Medication administration.  Evaluation of Outcomes: Progressing   RN Treatment Plan for Primary Diagnosis: Schizoaffective disorder, depressive type ([facility]) Long Term Goal(s): Knowledge of disease and therapeutic regimen to maintain health will improve  Short Term Goals: Ability to remain free from injury will improve, Ability to verbalize frustration and anger appropriately will improve, Ability to demonstrate self-control, Ability to participate in decision making will improve, Ability to verbalize feelings will improve, Ability to disclose and discuss suicidal ideas, Ability to identify and develop effective coping behaviors will improve, and Compliance with prescribed medications will improve  Medication Management: RN will administer medications as ordered by provider, will assess and evaluate patient's response and provide education to patient for prescribed medication. RN will report any adverse and/or side effects to prescribing provider.  Therapeutic Interventions: 1 on 1 counseling sessions, Psychoeducation, Medication administration, Evaluate responses to treatment, Monitor vital signs and CBGs as ordered, Perform/monitor CIWA, COWS, AIMS and Fall Risk screenings as ordered, Perform wound care treatments as ordered.  Evaluation of Outcomes: Progressing   LCSW Treatment Plan for Primary Diagnosis: Schizoaffective disorder, depressive type (H[facility]) Long Term Goal(s): Safe transition to appropriate next level of care at discharge, Engage patient in therapeutic group addressing  interpersonal concerns.  Short Term Goals: Engage patient in aftercare planning with referrals and resources, Increase social support, Increase ability to appropriately verbalize feelings, Increase emotional regulation, Facilitate acceptance of mental health diagnosis and concerns, and Increase skills for wellness and recovery  Therapeutic Interventions: Assess for all discharge needs, 1 to 1 time with Social worker, Explore available resources and support systems, Assess for adequacy in community support network, Educate family and significant other(s) on suicide prevention, Complete Psychosocial Assessment, Interpersonal group therapy.  Evaluation of Outcomes: Progressing   Progress in Treatment: Attending groups: Yes. Participating in groups: No. Taking medication as prescribed: Yes. Toleration medication: Yes. Family/Significant other contact made: Yes, individual(s) contacted:  guardian/sister, [name] Hinsch-Lennon. Patient understands diagnosis: No. Discussing patient identified problems/goals with staff: No. Medical problems stabilized or resolved: Yes. Denies suicidal/homicidal ideation: Yes. Issues/concerns per patient self-inventory: No. Other: none  New problem(s) identified: No, Describe:  none identified. Update 04/15/22: No changes at this time. Update 04/20/22: No changes at this time. Update 04/25/22: No changes at this time. Update 05/01/22: No changes at this time. 05/05/22 Update: No changes at this time.    New Short Term/Long Term Goal(s): elimination of symptoms of psychosis, medication management for mood stabilization; elimination of SI thoughts; development of comprehensive mental wellness plan. Update 04/15/22: No changes at this time. Update 04/20/22: No changes at this  time. Update 04/25/22: No changes at this time. Update 05/01/22: No changes at this time. 05/05/22 Update: No changes at this time.   Patient Goals:  "Nothing I want to work on. I want to be discharged."  Update 04/15/22: No changes at this time. Update 04/20/22: No changes at this time. Update 04/25/22: No changes at this time. Update 05/01/22: No changes at this time. 05/05/22 Update: No changes at this time.   Discharge Plan or Barriers: CSW will assist pt with development of an appropriate aftercare/discharge plan. Update 04/15/22: Pt has been unwilling to engage with the treatment team around discharge. Update 04/20/22: No changes at this time.   Update 04/25/22: Patient will be returning to live with her sister. Pt will receive outpatient services through [facility]. Update 05/01/22: No changes at this time. 05/05/22 Update: No changes at this time.   Reason for Continuation of Hospitalization: Aggression Medication stabilization   Estimated Length of Stay: 1-7 days Update 04/15/22: No changes at this time. Update 04/20/22: TBD Update 04/25/22: TBD Update 05/01/22: No changes at this time. 05/05/22 Update: No changes at this time.  Last 3 [place] Suicide Severity Risk Score: Flowsheet Row Admission (Current) from 04/08/2022 in [facility] INPATIENT BEHAV[facility]AL MEDICINE Admission (Discharged) from 10/17/2021 in [facility] BEHAVIORAL [facility]ICINE Admission (Discharged) from 10/02/2021 in [facility] REGIONAL CARDIAC MED PCU  C-SSRS RISK CATEGORY No Risk No Risk No Risk       Last PHQ 2/9 Scores:     No data to display          Scribe for Treatment Team: Glenis Smoker, LCSW 05/05/2022 11:03 AM

## 2022-05-05 NOTE — Progress Notes (Signed)
Recreation Therapy Notes  Date: 05/05/2022  Time: 10:40 am   Location: Craft room    Behavioral response: Appropriate  Intervention Topic:  Time Management   Discussion/Intervention:  Group content today was focused on time management. The group defined time management and identified healthy ways to manage time. Individuals expressed how much of the 24 hours they use in a day. Patients expressed how much time they use just for themselves personally. The group expressed how they have managed their time in the past. Individuals participated in the intervention "Managing Life" where they had a chance to see how much of the 24 hours they use and where it goes. Clinical Observations/Feedback: Patient came to group and was focused on what peers and staff had to say about time management. She defined time management as being prompt to things you must do. Individual was social with peers and staff while participating in the intervention.    Zaylan Kissoon LRT/CTRS         Gelene Recktenwald 05/05/2022 12:39 PM

## 2022-05-05 NOTE — Group Note (Signed)
[facility] LCSW Group Therapy Note   Group Date: 05/05/2022 Start Time: 1300 End Time: 1400   Type of Therapy and Topic: Group Therapy: Avoiding Self-Sabotaging and Enabling Behaviors  Participation Level: Minimal  Mood: depressed   Description of Group:  In this group, patients will learn how to identify obstacles, self-sabotaging and enabling behaviors, as well as: what are they, why do we do them and what needs these behaviors meet. Discuss unhealthy relationships and how to have positive healthy boundaries with those that sabotage and enable. Explore aspects of self-sabotage and enabling in yourself and how to limit these self-destructive behaviors in everyday life.   Therapeutic Goals: 1. Patient will identify one obstacle that relates to self-sabotage and enabling behaviors 2. Patient will identify one personal self-sabotaging or enabling behavior they did prior to admission 3. Patient will state a plan to change the above identified behavior 4. Patient will demonstrate ability to communicate their needs through discussion and/or role play.    Summary of Patient Progress:   Patient was present for the entirety of group session. Patient participated in opening and closing remarks. However, patient did not contribute at all to the topic of discussion despite encouraged participation.    Therapeutic Modalities:  Cognitive Behavioral Therapy Person-Centered Therapy Motivational Interviewing    Durenda Hurt, [place]

## 2022-05-06 DIAGNOSIS — F251 Schizoaffective disorder, depressive type: Secondary | ICD-10-CM | POA: Diagnosis not present

## 2022-05-06 NOTE — Progress Notes (Signed)
Patient is pleasant. More social and visible in the mileu.  She is med complaint and receives her  meds without issue. Denies si/hi/avh.  Patient also denies anxiety and depression.  No issues or concerns to report. Will continue to monitor with q15 minute safety checks.    C Butler-Nicholson, LPN

## 2022-05-06 NOTE — Progress Notes (Signed)
[facility] MD Progress Note  05/06/2022 5:16 PM [name]  MRN:  [id number] Subjective: No new complaint.  Continues to be paranoid and disorganized and unable to cooperate in discharge planning Principal Problem: Schizoaffective disorder, depressive type (HCC) Diagnosis: Principal Problem:   Schizoaffective disorder, depressive type (HCC)  Total Time spent with patient: 30 minutes  Past Psychiatric History: Past history of schizophrenia  Past Medical History:  Past Medical History:  Diagnosis Date   Anemia 10/02/2021   Non compliance w medication regimen    Schizophrenia [facility])     Past Surgical History:  Procedure Laterality Date   BIOPSY  09/25/2021   Procedure: BIOPSY;  Surgeon: Rachael Fee, MD;  Location: [facility];  Service: Gastroenterology;;   ESOPHAGOGASTRODUODENOSCOPY (EGD) WITH PROPOFOL [name] 09/25/2021   Procedure: ESOPHAGOGASTRODUODENOSCOPY (EGD) WITH PROPOFOL;  Surgeon: Rachael Fee, MD;  Location: [facility];  Service: Gastroenterology;  Laterality: [name];  With NGT placement   Family History: History reviewed. No pertinent family history. Family Psychiatric  History: See previous Social History:  Social History   Substance and Sexual Activity  Alcohol Use Not Currently   Comment: Refuses to disclose how much     Social History   Substance and Sexual Activity  Drug Use No   Comment: Refuses to answer    Social History   Socioeconomic History   Marital status: Single    Spouse name: Not on file   Number of children: Not on file   Years of education: Not on file   Highest education level: Not on file  Occupational History   Not on file  Tobacco Use   Smoking status: Every Day    Packs/day: 2.00    Types: Cigarettes   Smokeless tobacco: Never  Substance and Sexual Activity   Alcohol use: Not Currently    Comment: Refuses to disclose how much   Drug use: No    Comment: Refuses to answer   Sexual activity: Not Currently    Comment: refused to  answer  Other Topics Concern   Not on file  Social History Narrative   Not on file   Social Determinants of Health   Financial Resource Strain: Not on file  Food Insecurity: No Food Insecurity (04/08/2022)   Hunger Vital Sign    Worried About Running Out of Food in the Last Year: Never true    Ran Out of Food in the Last Year: Never true  Transportation Needs: No Transportation Needs (04/08/2022)   PRAPARE - Administrator, Civil Service (Medical): No    Lack of Transportation (Non-Medical): No  Physical Activity: Not on file  Stress: Not on file  Social Connections: Not on file   Additional Social History:                         Sleep: Fair  Appetite:  Fair  Current Medications: Current Facility-Administered Medications  Medication Dose Route Frequency Provider Last Rate Last Admin   acetaminophen (TYLENOL) tablet 650 mg  650 mg Oral Q6H PRN Zakeria Kulzer T, MD       alum & mag hydroxide-simeth (MAALOX/MYLANTA) 200-200-20 MG/5ML suspension 30 mL  30 mL Oral Q4H PRN Sharene Krikorian T, MD       haloperidol (HALDOL) tablet 10 mg  10 mg Oral BID Archita Lomeli T, MD   10 mg at 05/06/22 1633   Or   haloperidol lactate (HALDOL) injection 5 mg  5 mg Intramuscular BID  Alvin Rubano, [name], MD       LORazepam (ATIVAN) tablet 1 mg  1 mg Oral BID Damontre Millea T, MD   1 mg at 05/06/22 1633   Or   LORazepam (ATIVAN) injection 1 mg  1 mg Intramuscular BID Jean Alejos T, MD       magnesium hydroxide (MILK OF MAGNESIA) suspension 30 mL  30 mL Oral Daily PRN Alaysia Lightle T, MD       mirtazapine (REMERON) tablet 30 mg  30 mg Oral QHS Bertina Guthridge T, MD   30 mg at 05/05/22 2115   multivitamin with minerals tablet 1 tablet  1 tablet Oral Daily Vianca Bracher, [name], MD   1 tablet at 05/06/22 0751   OLANZapine (ZYPREXA) tablet 30 mg  30 mg Oral QHS Quentin Shorey T, MD   30 mg at 05/05/22 2115   thiamine (VITAMIN B1) tablet 100 mg  100 mg Oral Daily Antonino Nienhuis, [name], MD   100 mg at  05/06/22 0751    Lab Results: No results found for this or any previous visit (from the past 48 hour(s)).  Blood Alcohol level:  Lab Results  Component Value Date   ETH <10 03/29/2022   ETH <10 09/12/2021    Metabolic Disorder Labs: Lab Results  Component Value Date   HGBA1C 5.6 10/21/2021   MPG 114 10/21/2021   MPG 111 10/02/2021   Lab Results  Component Value Date   PROLACTIN 13.6 04/16/2015   Lab Results  Component Value Date   CHOL 189 10/21/2021   TRIG 52 10/21/2021   HDL 50 10/21/2021   CHOLHDL 3.8 10/21/2021   VLDL 10 10/21/2021   LDLCALC 129 (H) 10/21/2021   LDLCALC 93 10/01/2020    Physical Findings: AIMS: Facial and Oral Movements Muscles of Facial Expression: None, normal Lips and Perioral Area: None, normal Jaw: None, normal Tongue: None, normal,Extremity Movements Upper (arms, wrists, hands, fingers): None, normal Lower (legs, knees, ankles, toes): None, normal, Trunk Movements Neck, shoulders, hips: None, normal, Overall Severity Severity of abnormal movements (highest score from questions above): None, normal Incapacitation due to abnormal movements: None, normal Patient's awareness of abnormal movements (rate only patient's report): No Awareness, Dental Status Current problems with teeth and/or dentures?: No Does patient usually wear dentures?: No  CIWA:    COWS:     Musculoskeletal: Strength & Muscle Tone: within normal limits Gait & Station: normal Patient leans: [name]  Psychiatric Specialty Exam:  Presentation  General Appearance:  Disheveled  Eye Contact: Fair  Speech: Pressured  Speech Volume: Increased  Handedness: Right   Mood and Affect  Mood: Irritable; Labile; Angry  Affect: Blunt; Labile   Thought Process  Thought Processes: Disorganized; Irrevelant  Descriptions of Associations:Loose  Orientation:Partial  Thought Content:Illogical; Rumination; Scattered; Tangential; Delusions  History of  Schizophrenia/Schizoaffective disorder:Yes  Duration of Psychotic Symptoms:Greater than six months  Hallucinations:No data recorded Ideas of Reference:Percusatory; Paranoia  Suicidal Thoughts:No data recorded Homicidal Thoughts:No data recorded  Sensorium  Memory: Immediate Fair; Recent Fair; Remote Fair  Judgment: Impaired  Insight: Poor; Lacking   Executive Functions  Concentration: Poor  Attention Span: Poor  Recall: Poor  Fund of Knowledge: Poor  Language: Poor   Psychomotor Activity  Psychomotor Activity:No data recorded  Assets  Assets: Physical Health; Resilience; Housing; Social Support   Sleep  Sleep:No data recorded   Physical Exam: Physical Exam Vitals reviewed.  Constitutional:      Appearance: Normal appearance.  HENT:     Head:  Normocephalic and atraumatic.     Mouth/Throat:     Pharynx: Oropharynx is clear.  Eyes:     Pupils: Pupils are equal, round, and reactive to light.  Cardiovascular:     Rate and Rhythm: Normal rate and regular rhythm.  Pulmonary:     Effort: Pulmonary effort is normal.     Breath sounds: Normal breath sounds.  Abdominal:     General: Abdomen is flat.     Palpations: Abdomen is soft.  Musculoskeletal:        General: Normal range of motion.  Skin:    General: Skin is warm and dry.  Neurological:     General: No focal deficit present.     Mental Status: She is alert. Mental status is at baseline.  Psychiatric:        Attention and Perception: Attention normal.        Mood and Affect: Mood normal. Affect is blunt.        Speech: Speech is delayed.        Behavior: Behavior is withdrawn.        Thought Content: Thought content is paranoid and delusional.        Cognition and Memory: Cognition is impaired.    Review of Systems  Constitutional: Negative.   HENT: Negative.    Eyes: Negative.   Respiratory: Negative.    Cardiovascular: Negative.   Gastrointestinal: Negative.   Musculoskeletal:  Negative.   Skin: Negative.   Neurological: Negative.   Psychiatric/Behavioral: Negative.     Blood pressure (!) 106/50, pulse (!) 57, temperature 98.2 F (36.8 C), temperature source Oral, resp. rate 16, height 5\' 4"  (1.626 m), weight 40.4 kg, SpO2 100 %. Body mass index is 15.28 kg/m.   Treatment Plan Summary: Medication management and Plan no change to medication management.  Tried to get her every day to engage in some reasonable conversation about discharge but she is too psychotic  Alethia Berthold, MD 05/06/2022, 5:16 PM

## 2022-05-06 NOTE — Progress Notes (Signed)
Recreation Therapy Notes  Date: 05/06/2022  Time: 10:15am   Location: Craft room    Behavioral response: Appropriate  Intervention Topic:  Stress Management    Discussion/Intervention:  Group content on today was focused on stress. The group defined stress and way to cope with stress. Participants expressed how they know when they are stresses out. Individuals described the different ways they have to cope with stress. The group stated reasons why it is important to cope with stress. Patient explained what good stress is and some examples. The group participated in the intervention "Stress Management". Individuals were separated into two group and answered questions related to stress.  Clinical Observations/Feedback: Patient came to group and was focused on what peers and staff had to say about stress management. Individual was social with peers and staff while participating in the intervention.    Amiee Wiley LRT/CTRS         Mishal Probert 05/06/2022 12:05 PM

## 2022-05-06 NOTE — Progress Notes (Signed)
Pt denies SI/HI/AVH and verbally agrees to approach staff if these become apparent or before harming themselves/others. Rates depression 0/10. Rates anxiety 0/10. Rates pain 0/10. Scheduled medications administered to pt, per MD orders. RN provided support and encouragement to pt. Q15 min safety checks implemented and continued. Pt safe on the unit. RN will continue to monitor and intervene as needed.  Problem: Clinical Measurements: Goal: Ability to maintain clinical measurements within normal limits will improve Outcome: Progressing Goal: Will remain free from infection Outcome: Progressing   05/06/22 0751  Psych Admission Type (Psych Patients Only)  Admission Status Involuntary  Psychosocial Assessment  Patient Complaints None  Eye Contact Brief  Facial Expression Flat  Affect Blunted  Speech Logical/coherent  Interaction Forwards little;Guarded;Minimal  Motor Activity Slow  Appearance/Hygiene Unremarkable;In scrubs  Behavior Characteristics Cooperative;Appropriate to situation;Calm  Mood Pleasant  Thought Process  Coherency WDL  Content WDL  Delusions None reported or observed  Perception WDL  Hallucination None reported or observed  Judgment Limited  Confusion None  Danger to Self  Current suicidal ideation? Denies  Danger to Others  Danger to Others None reported or observed

## 2022-05-06 NOTE — Plan of Care (Signed)
  Problem: Education: Goal: Knowledge of General Education information will improve Description: Including pain rating scale, medication(s)/side effects and non-pharmacologic comfort measures Outcome: Progressing   Problem: Activity: Goal: Risk for activity intolerance will decrease Outcome: Progressing   

## 2022-05-06 NOTE — Group Note (Signed)
[facility] LCSW Group Therapy Note   Group Date: 05/06/2022 Start Time: 1325 End Time: 1425   Type of Therapy/Topic:  Group Therapy:  Emotion Regulation  Participation Level:  Did Not Attend    Description of Group:    The purpose of this group is to assist patients in learning to regulate negative emotions and experience positive emotions. Patients will be guided to discuss ways in which they have been vulnerable to their negative emotions. These vulnerabilities will be juxtaposed with experiences of positive emotions or situations, and patients challenged to use positive emotions to combat negative ones. Special emphasis will be placed on coping with negative emotions in conflict situations, and patients will process healthy conflict resolution skills.  Therapeutic Goals: Patient will identify two positive emotions or experiences to reflect on in order to balance out negative emotions:  Patient will label two or more emotions that they find the most difficult to experience:  Patient will be able to demonstrate positive conflict resolution skills through discussion or role plays:   Summary of Patient Progress: X   Therapeutic Modalities:   Cognitive Behavioral Therapy Feelings Identification Dialectical Behavioral Therapy   Jahnavi Muratore R Niara Bunker, LCSW 

## 2022-05-07 DIAGNOSIS — F251 Schizoaffective disorder, depressive type: Secondary | ICD-10-CM | POA: Diagnosis not present

## 2022-05-07 LAB — BASIC METABOLIC PANEL
Anion gap: 9 (ref 5–15)
BUN: 31 mg/dL — ABNORMAL HIGH (ref 8–23)
CO2: 29 mmol/L (ref 22–32)
Calcium: 9.1 mg/dL (ref 8.9–10.3)
Chloride: 97 mmol/L — ABNORMAL LOW (ref 98–111)
Creatinine, Ser: 0.86 mg/dL (ref 0.44–1.00)
GFR, Estimated: >60 mL/min (ref >60)
Glucose, Bld: 108 mg/dL — ABNORMAL HIGH (ref 70–99)
Potassium: 4.3 mmol/L (ref 3.5–5.1)
Sodium: 135 mmol/L (ref 135–145)

## 2022-05-07 NOTE — Progress Notes (Signed)
[facility] MD Progress Note  05/07/2022 4:16 PM [name]  MRN:  [id number] Subjective: Follow-up patient with schizoaffective disorder.  Patient continues to be delusional refusing to believe that her sister or mother are alive and refusing to cooperate in any discussion about going home.  Eats but stays thin.  Takes her medicine but otherwise cooperates very little.  Very illogical. Principal Problem: Schizoaffective disorder, depressive type ([place]) Diagnosis: Principal Problem:   Schizoaffective disorder, depressive type ([place])  Total Time spent with patient: 30 minutes  Past Psychiatric History: Past history of schizophrenia and schizoaffective disorder with symptoms getting worse it seems in the last couple years  Past Medical History:  Past Medical History:  Diagnosis Date   Anemia 10/02/2021   Non compliance w medication regimen    Schizophrenia [facility])     Past Surgical History:  Procedure Laterality Date   BIOPSY  09/25/2021   Procedure: BIOPSY;  Surgeon: Milus Banister, MD;  Location: [facility];  Service: Gastroenterology;;   ESOPHAGOGASTRODUODENOSCOPY (EGD) WITH PROPOFOL [name] 09/25/2021   Procedure: ESOPHAGOGASTRODUODENOSCOPY (EGD) WITH PROPOFOL;  Surgeon: Milus Banister, MD;  Location: [facility];  Service: Gastroenterology;  Laterality: [name];  With NGT placement   Family History: History reviewed. No pertinent family history. Family Psychiatric  History: See previous Social History:  Social History   Substance and Sexual Activity  Alcohol Use Not Currently   Comment: Refuses to disclose how much     Social History   Substance and Sexual Activity  Drug Use No   Comment: Refuses to answer    Social History   Socioeconomic History   Marital status: Single    Spouse name: Not on file   Number of children: Not on file   Years of education: Not on file   Highest education level: Not on file  Occupational History   Not on file  Tobacco Use   Smoking status: Every  Day    Packs/day: 2.00    Types: Cigarettes   Smokeless tobacco: Never  Substance and Sexual Activity   Alcohol use: Not Currently    Comment: Refuses to disclose how much   Drug use: No    Comment: Refuses to answer   Sexual activity: Not Currently    Comment: refused to answer  Other Topics Concern   Not on file  Social History Narrative   Not on file   Social Determinants of Health   Financial Resource Strain: Not on file  Food Insecurity: No Food Insecurity (04/08/2022)   Hunger Vital Sign    Worried About Running Out of Food in the Last Year: Never true    Ran Out of Food in the Last Year: Never true  Transportation Needs: No Transportation Needs (04/08/2022)   PRAPARE - Hydrologist (Medical): No    Lack of Transportation (Non-Medical): No  Physical Activity: Not on file  Stress: Not on file  Social Connections: Not on file   Additional Social History:                         Sleep: Fair  Appetite:  Fair  Current Medications: Current Facility-Administered Medications  Medication Dose Route Frequency Provider Last Rate Last Admin   acetaminophen (TYLENOL) tablet 650 mg  650 mg Oral Q6H PRN Deshannon Seide T, MD       alum & mag hydroxide-simeth (MAALOX/MYLANTA) 200-200-20 MG/5ML suspension 30 mL  30 mL Oral Q4H PRN Fynn Vanblarcom  T, MD       haloperidol (HALDOL) tablet 10 mg  10 mg Oral BID Anasofia Micallef T, MD   10 mg at 05/07/22 [id number]   Or   haloperidol lactate (HALDOL) injection 5 mg  5 mg Intramuscular BID Youssef Footman T, MD       LORazepam (ATIVAN) tablet 1 mg  1 mg Oral BID Khalen Styer T, MD   1 mg at 05/07/22 [id number]   Or   LORazepam (ATIVAN) injection 1 mg  1 mg Intramuscular BID Chasity Outten T, MD       magnesium hydroxide (MILK OF MAGNESIA) suspension 30 mL  30 mL Oral Daily PRN Nyashia Raney T, MD       mirtazapine (REMERON) tablet 30 mg  30 mg Oral QHS Dessire Grimes T, MD   30 mg at 05/06/22 2120   multivitamin with  minerals tablet 1 tablet  1 tablet Oral Daily Brunetta Newingham, [name], MD   1 tablet at 05/07/22 0809   OLANZapine (ZYPREXA) tablet 30 mg  30 mg Oral QHS Brunette Lavalle T, MD   30 mg at 05/06/22 2120   thiamine (VITAMIN B1) tablet 100 mg  100 mg Oral Daily Obie Kallenbach, [name], MD   100 mg at 05/07/22 0809    Lab Results: No results found for this or any previous visit (from the past 65 hour(s)).  Blood Alcohol level:  Lab Results  Component Value Date   ETH <10 03/29/2022   ETH <10 123XX123    Metabolic Disorder Labs: Lab Results  Component Value Date   HGBA1C 5.6 10/21/2021   MPG 114 10/21/2021   MPG 111 10/02/2021   Lab Results  Component Value Date   PROLACTIN 13.6 04/16/2015   Lab Results  Component Value Date   CHOL 189 10/21/2021   TRIG 52 10/21/2021   HDL 50 10/21/2021   CHOLHDL 3.8 10/21/2021   VLDL 10 10/21/2021   LDLCALC 129 (H) 10/21/2021   LDLCALC 93 10/01/2020    Physical Findings: AIMS: Facial and Oral Movements Muscles of Facial Expression: None, normal Lips and Perioral Area: None, normal Jaw: None, normal Tongue: None, normal,Extremity Movements Upper (arms, wrists, hands, fingers): None, normal Lower (legs, knees, ankles, toes): None, normal, Trunk Movements Neck, shoulders, hips: None, normal, Overall Severity Severity of abnormal movements (highest score from questions above): None, normal Incapacitation due to abnormal movements: None, normal Patient's awareness of abnormal movements (rate only patient's report): No Awareness, Dental Status Current problems with teeth and/or dentures?: No Does patient usually wear dentures?: No  CIWA:    COWS:     Musculoskeletal: Strength & Muscle Tone: within normal limits Gait & Station: normal Patient leans: [name]  Psychiatric Specialty Exam:  Presentation  General Appearance:  Disheveled  Eye Contact: Fair  Speech: Pressured  Speech Volume: Increased  Handedness: Right   Mood and Affect   Mood: Irritable; Labile; Angry  Affect: Blunt; Labile   Thought Process  Thought Processes: Disorganized; Irrevelant  Descriptions of Associations:Loose  Orientation:Partial  Thought Content:Illogical; Rumination; Scattered; Tangential; Delusions  History of Schizophrenia/Schizoaffective disorder:Yes  Duration of Psychotic Symptoms:Greater than six months  Hallucinations:No data recorded Ideas of Reference:Percusatory; Paranoia  Suicidal Thoughts:No data recorded Homicidal Thoughts:No data recorded  Sensorium  Memory: Immediate Fair; Recent Fair; Remote Fair  Judgment: Impaired  Insight: Poor; Lacking   Executive Functions  Concentration: Poor  Attention Span: Poor  Recall: Poor  Fund of Knowledge: Poor  Language: Poor   Psychomotor Activity  Psychomotor  Activity:No data recorded  Assets  Assets: Physical Health; Resilience; Housing; Social Support   Sleep  Sleep:No data recorded   Physical Exam: Physical Exam Vitals and nursing note reviewed.  Constitutional:      Appearance: Normal appearance.  HENT:     Head: Normocephalic and atraumatic.     Mouth/Throat:     Pharynx: Oropharynx is clear.  Eyes:     Pupils: Pupils are equal, round, and reactive to light.  Cardiovascular:     Rate and Rhythm: Normal rate and regular rhythm.  Pulmonary:     Effort: Pulmonary effort is normal.     Breath sounds: Normal breath sounds.  Abdominal:     General: Abdomen is flat.     Palpations: Abdomen is soft.  Musculoskeletal:        General: Normal range of motion.  Skin:    General: Skin is warm and dry.  Neurological:     General: No focal deficit present.     Mental Status: She is alert. Mental status is at baseline.  Psychiatric:        Attention and Perception: She is inattentive.        Mood and Affect: Mood normal. Affect is blunt.        Speech: Speech is delayed.        Behavior: Behavior is slowed.        Thought Content:  Thought content is delusional.    Review of Systems  Constitutional: Negative.   HENT: Negative.    Eyes: Negative.   Respiratory: Negative.    Cardiovascular: Negative.   Gastrointestinal: Negative.   Musculoskeletal: Negative.   Skin: Negative.   Neurological: Negative.   Psychiatric/Behavioral:  Positive for depression. The patient is nervous/anxious.    Blood pressure 109/68, pulse (!) 54, temperature 98.4 F (36.9 C), temperature source Oral, resp. rate 18, height 5\' 4"  (1.626 m), weight 40.4 kg, SpO2 100 %. Body mass index is 15.28 kg/m.   Treatment Plan Summary: Medication management and Plan I am going to check her basic metabolic panel again because I am thinking of adding lithium.  Not sure what else we can do.  Patient refuses to consider ECT.  She is on reasonable and while we have tried to plan for discharge her family is very against that.  Alethia Berthold, MD 05/07/2022, 4:16 PM

## 2022-05-07 NOTE — Progress Notes (Signed)
Pt denies SI/HI/AVH and verbally agrees to approach staff if these become apparent or before harming themselves/others. Rates depression 0/10. Rates anxiety 0/10. Rates pain 0/10. Pt comes up and asks for her meds. Pt goes to groups but does not interact with others. Scheduled medications administered to pt, per MD orders. RN provided support and encouragement to pt. Q15 min safety checks implemented and continued. Pt safe on the unit. RN will continue to monitor and intervene as needed.   Problem: Activity: Goal: Will verbalize the importance of balancing activity with adequate rest periods Outcome: Progressing   Problem: Education: Goal: Will be free of psychotic symptoms Outcome: Progressing   05/07/22 0809  Psych Admission Type (Psych Patients Only)  Admission Status Involuntary  Psychosocial Assessment  Patient Complaints None  Eye Contact Brief  Facial Expression Flat  Affect Blunted  Speech Logical/coherent  Interaction Forwards little;Guarded;Minimal  Motor Activity Slow  Appearance/Hygiene Unremarkable;In scrubs  Behavior Characteristics Cooperative;Appropriate to situation;Calm  Mood Pleasant  Thought Process  Coherency WDL  Content WDL  Delusions None reported or observed  Perception WDL  Hallucination None reported or observed  Judgment Limited  Confusion None  Danger to Self  Current suicidal ideation? Denies  Danger to Others  Danger to Others None reported or observed

## 2022-05-07 NOTE — Plan of Care (Signed)
Pt denies anxiety/depression at this time. Pt denies SI/HI/AVH or pain at this time. Pt is calm and cooperative. Pt is medication compliant. Pt provided with support and encouragement. Pt monitored q15 minutes for safety per unit policy. Plan of care ongoing.    Pt is quiet, forwards little, and is reserved. Pt is however calm and cooperative. Pt comes to medication room for medications. Pt's mood/affect is improved from admission.   Problem: Education: Goal: Knowledge of General Education information will improve Description: Including pain rating scale, medication(s)/side effects and non-pharmacologic comfort measures Outcome: Progressing   Problem: Activity: Goal: Risk for activity intolerance will decrease Outcome: Progressing

## 2022-05-07 NOTE — Group Note (Signed)

## 2022-05-08 DIAGNOSIS — F251 Schizoaffective disorder, depressive type: Secondary | ICD-10-CM | POA: Diagnosis not present

## 2022-05-08 MED ORDER — LITHIUM CARBONATE 300 MG PO CAPS
300.0000 mg | ORAL_CAPSULE | Freq: Every day | ORAL | Status: DC
  Administered 2022-05-08 – 2022-05-10 (×3): 300 mg via ORAL
  Filled 2022-05-08 (×4): qty 1

## 2022-05-08 NOTE — Progress Notes (Signed)
[facility] MD Progress Note  05/08/2022 2:19 PM [name]  MRN:  [id number] Subjective:  continues to be delusional and incapable of discussing appropriate treatment and discharge options Principal Problem: Schizoaffective disorder, depressive type ([facility]) Diagnosis: Principal Problem:   Schizoaffective disorder, depressive type ([facility])  Total Time spent with patient: 30 minutes  Past Psychiatric History: schizophrenia  Past Medical History:  Past Medical History:  Diagnosis Date   Anemia 10/02/2021   Non compliance w medication regimen    Schizophrenia ([facility])     Past Surgical History:  Procedure Laterality Date   BIOPSY  09/25/2021   Procedure: BIOPSY;  Surgeon: Rachael Fee, MD;  Location: [facility];  Service: Gastroenterology;;   ESOPHAGOGASTRODUODENOSCOPY (EGD) WITH PROPOFOL [name] 09/25/2021   Procedure: ESOPHAGOGASTRODUODENOSCOPY (EGD) WITH PROPOFOL;  Surgeon: Rachael Fee, MD;  Location: [facility];  Service: Gastroenterology;  Laterality: [name];  With NGT placement   Family History: History reviewed. No pertinent family history. Family Psychiatric  History: none Social History:  Social History   Substance and Sexual Activity  Alcohol Use Not Currently   Comment: Refuses to disclose how much     Social History   Substance and Sexual Activity  Drug Use No   Comment: Refuses to answer    Social History   Socioeconomic History   Marital status: Single    Spouse name: Not on file   Number of children: Not on file   Years of education: Not on file   Highest education level: Not on file  Occupational History   Not on file  Tobacco Use   Smoking status: Every Day    Packs/day: 2.00    Types: Cigarettes   Smokeless tobacco: Never  Substance and Sexual Activity   Alcohol use: Not Currently    Comment: Refuses to disclose how much   Drug use: No    Comment: Refuses to answer   Sexual activity: Not Currently    Comment: refused to answer  Other Topics Concern    Not on file  Social History Narrative   Not on file   Social Determinants of Health   Financial Resource Strain: Not on file  Food Insecurity: No Food Insecurity (04/08/2022)   Hunger Vital Sign    Worried About Running Out of Food in the Last Year: Never true    Ran Out of Food in the Last Year: Never true  Transportation Needs: No Transportation Needs (04/08/2022)   PRAPARE - Administrator, Civil Service (Medical): No    Lack of Transportation (Non-Medical): No  Physical Activity: Not on file  Stress: Not on file  Social Connections: Not on file   Additional Social History:                         Sleep: Fair  Appetite:  Fair  Current Medications: Current Facility-Administered Medications  Medication Dose Route Frequency Provider Last Rate Last Admin   acetaminophen (TYLENOL) tablet 650 mg  650 mg Oral Q6H PRN Riah Kehoe T, MD       alum & mag hydroxide-simeth (MAALOX/MYLANTA) 200-200-20 MG/5ML suspension 30 mL  30 mL Oral Q4H PRN Abhijot Straughter T, MD       haloperidol (HALDOL) tablet 10 mg  10 mg Oral BID Daisean Brodhead T, MD   10 mg at 05/08/22 0930   Or   haloperidol lactate (HALDOL) injection 5 mg  5 mg Intramuscular BID Tava Peery, [name], MD  lithium carbonate capsule 300 mg  300 mg Oral QHS Malahki Gasaway, [name], MD       LORazepam (ATIVAN) tablet 1 mg  1 mg Oral BID Alaycia Eardley, [name], MD   1 mg at 05/08/22 0930   Or   LORazepam (ATIVAN) injection 1 mg  1 mg Intramuscular BID Hillarie Harrigan, [name], MD       magnesium hydroxide (MILK OF MAGNESIA) suspension 30 mL  30 mL Oral Daily PRN Keita [place], [name], MD       mirtazapine (REMERON) tablet 30 mg  30 mg Oral QHS Jones Viviani T, MD   30 mg at 05/07/22 2135   multivitamin with minerals tablet 1 tablet  1 tablet Oral Daily Chanese Hartsough, [name], MD   1 tablet at 05/08/22 0930   OLANZapine (ZYPREXA) tablet 30 mg  30 mg Oral QHS Anaika Santillano T, MD   30 mg at 05/07/22 2135   thiamine (VITAMIN B1) tablet 100 mg  100 mg  Oral Daily Kodey Xue, [name], MD   100 mg at 05/08/22 0930    Lab Results:  Results for orders placed or performed during the hospital encounter of 04/08/22 (from the past 48 hour(s))  Basic metabolic panel     Status: Abnormal   Collection Time: 05/07/22  5:52 PM  Result Value Ref Range   Sodium 135 135 - 145 mmol/L   Potassium 4.3 3.5 - 5.1 mmol/L   Chloride 97 (L) 98 - 111 mmol/L   CO2 29 22 - 32 mmol/L   Glucose, Bld 108 (H) 70 - 99 mg/dL    Comment: Glucose reference range applies only to samples taken after fasting for at least 8 hours.   BUN 31 (H) 8 - 23 mg/dL   Creatinine, Ser 0.86 0.44 - 1.00 mg/dL   Calcium 9.1 8.9 - 10.3 mg/dL   GFR, Estimated >60 >60 mL/min    Comment: (NOTE) Calculated using the CKD-EPI Creatinine Equation (2021)    Anion gap 9 5 - 15    Comment: Performed at [facility], [address] [place], [place] [address]    Blood Alcohol level:  Lab Results  Component Value Date   [facility] <10 03/29/2022   ETH <10 61/95/0932    Metabolic Disorder Labs: Lab Results  Component Value Date   HGBA1C 5.6 10/21/2021   MPG 114 10/21/2021   MPG 111 10/02/2021   Lab Results  Component Value Date   PROLACTIN 13.6 04/16/2015   Lab Results  Component Value Date   CHOL 189 10/21/2021   TRIG 52 10/21/2021   HDL 50 10/21/2021   CHOLHDL 3.8 10/21/2021   VLDL 10 10/21/2021   LDLCALC 129 (H) 10/21/2021   LDLCALC 93 10/01/2020    Physical Findings: AIMS: Facial and Oral Movements Muscles of Facial Expression: None, normal Lips and Perioral Area: None, normal Jaw: None, normal Tongue: None, normal,Extremity Movements Upper (arms, wrists, hands, fingers): None, normal Lower (legs, knees, ankles, toes): None, normal, Trunk Movements Neck, shoulders, hips: None, normal, Overall Severity Severity of abnormal movements (highest score from questions above): None, normal Incapacitation due to abnormal movements: None, normal Patient's awareness of  abnormal movements (rate only patient's report): No Awareness, Dental Status Current problems with teeth and/or dentures?: No Does patient usually wear dentures?: No  CIWA:    COWS:     Musculoskeletal: Strength & Muscle Tone: within normal limits Gait & Station: normal Patient leans: [name]  Psychiatric Specialty Exam:  Presentation  General Appearance:  Disheveled  Eye Contact: Fair  Speech: Pressured  Speech Volume: Increased  Handedness: Right   Mood and Affect  Mood: Irritable; Labile; Angry  Affect: Blunt; Labile   Thought Process  Thought Processes: Disorganized; Irrevelant  Descriptions of Associations:Loose  Orientation:Partial  Thought Content:Illogical; Rumination; Scattered; Tangential; Delusions  History of Schizophrenia/Schizoaffective disorder:Yes  Duration of Psychotic Symptoms:Greater than six months  Hallucinations:No data recorded Ideas of Reference:Percusatory; Paranoia  Suicidal Thoughts:No data recorded Homicidal Thoughts:No data recorded  Sensorium  Memory: Immediate Fair; Recent Fair; Remote Fair  Judgment: Impaired  Insight: Poor; Lacking   Executive Functions  Concentration: Poor  Attention Span: Poor  Recall: Poor  Fund of Knowledge: Poor  Language: Poor   Psychomotor Activity  Psychomotor Activity:No data recorded  Assets  Assets: Physical Health; Resilience; Housing; Social Support   Sleep  Sleep:No data recorded   Physical Exam: Physical Exam Vitals reviewed.  Constitutional:      Appearance: Normal appearance.  HENT:     Head: Normocephalic and atraumatic.     Mouth/Throat:     Pharynx: Oropharynx is clear.  Eyes:     Pupils: Pupils are equal, round, and reactive to light.  Cardiovascular:     Rate and Rhythm: Normal rate and regular rhythm.  Pulmonary:     Effort: Pulmonary effort is normal.     Breath sounds: Normal breath sounds.  Abdominal:     General: Abdomen is flat.      Palpations: Abdomen is soft.  Musculoskeletal:        General: Normal range of motion.  Skin:    General: Skin is warm and dry.  Neurological:     General: No focal deficit present.     Mental Status: She is alert. Mental status is at baseline.  Psychiatric:        Attention and Perception: She is inattentive.        Mood and Affect: Mood normal. Affect is blunt.        Speech: Speech is delayed.        Behavior: Behavior is slowed.        Thought Content: Thought content is paranoid and delusional.        Cognition and Memory: Cognition is impaired.        Judgment: Judgment is inappropriate.    Review of Systems  Constitutional: Negative.   HENT: Negative.    Eyes: Negative.   Respiratory: Negative.    Cardiovascular: Negative.   Gastrointestinal: Negative.   Musculoskeletal: Negative.   Skin: Negative.   Neurological: Negative.   Psychiatric/Behavioral:  The patient is nervous/anxious.    Blood pressure (!) 104/57, pulse (!) 54, temperature 98.8 F (37.1 C), temperature source Oral, resp. rate 18, height 5\' 4"  (1.626 m), weight 40.4 kg, SpO2 100 %. Body mass index is 15.28 kg/m.   Treatment Plan Summary: Medication management and Plan chemistry all normal. Add lithium starting low at 300mg  qhs for mood disorder to try to improve insight and ability to work on discharge  , MD 05/08/2022, 2:19 PM

## 2022-05-08 NOTE — [facility] Group Notes (Signed)
[facility] Group Notes:  (Nursing/MHT/Case Management/Adjunct)  Date:  05/08/2022  Time:  11:54 AM  Type of Therapy:  Group Therapy  Participation Level:  Did Not Attend    Maglione,Chao Blazejewski E 05/08/2022, 11:54 AM

## 2022-05-08 NOTE — Progress Notes (Signed)
Patient spent most of the shift isolated to her room. She was compliant with medications on shift. She had minimal interaction with peers and staff. She denies SI/AVH/AVH. No new issues to report on shift at this time.

## 2022-05-08 NOTE — Group Note (Signed)
[facility] LCSW Group Therapy Note   Group Date: 05/08/2022 Start Time: 1305 End Time: 1405  Type of Therapy and Topic:  Group Therapy:  Feelings around Relapse and Recovery  Participation Level:  Did Not Attend    Description of Group:    Patients in this group will discuss emotions they experience before and after a relapse. They will process how experiencing these feelings, or avoidance of experiencing them, relates to having a relapse. Facilitator will guide patients to explore emotions they have related to recovery. Patients will be encouraged to process which emotions are more powerful. They will be guided to discuss the emotional reaction significant others in their lives may have to patients' relapse or recovery. Patients will be assisted in exploring ways to respond to the emotions of others without this contributing to a relapse.  Therapeutic Goals: Patient will identify two or more emotions that lead to relapse for them:  Patient will identify two emotions that result when they relapse:  Patient will identify two emotions related to recovery:  Patient will demonstrate ability to communicate their needs through discussion and/or role plays.   Summary of Patient Progress: X   Therapeutic Modalities:   Cognitive Behavioral Therapy Solution-Focused Therapy Assertiveness Training Relapse Prevention Therapy   Shirl Harris, LCSW

## 2022-05-09 DIAGNOSIS — F251 Schizoaffective disorder, depressive type: Secondary | ICD-10-CM | POA: Diagnosis not present

## 2022-05-09 NOTE — Plan of Care (Signed)
  Problem: Health Behavior/Discharge Planning: Goal: Ability to manage health-related needs will improve Outcome: Progressing   Problem: Clinical Measurements: Goal: Ability to maintain clinical measurements within normal limits will improve Outcome: Progressing Goal: Will remain free from infection Outcome: Progressing   Problem: Activity: Goal: Risk for activity intolerance will decrease Outcome: Progressing   Problem: Coping: Goal: Level of anxiety will decrease Outcome: Progressing   

## 2022-05-09 NOTE — [facility] Group Notes (Signed)
LCSW Wellness Group Note   05/09/2022 13:00  Type of Group and Topic: Psychoeducational Group:  Wellness  Participation Level:  did not attend  Description of Group  Wellness group introduces the topic and its focus on developing healthy habits across the spectrum and its relationship to a decrease in hospital admissions.  Six areas of wellness are discussed: physical, social spiritual, intellectual, occupational, and emotional.  Patients are asked to consider their current wellness habits and to identify areas of wellness where they are interested and able to focus on improvements.    Therapeutic Goals Patients will understand components of wellness and how they can positively impact overall health.  Patients will identify areas of wellness where they have developed good habits. Patients will identify areas of wellness where they would like to make improvements.    Summary of Patient Progress     Therapeutic Modalities: Cognitive Behavioral Therapy Psychoeducation    Bri Wakeman Jon, LCSW   

## 2022-05-09 NOTE — Progress Notes (Signed)
Patient alert and oriented x 4, affect is flat but brightens upon approach. Patient was receptive to staff and denies and SI/HI/AVH. Patient was noted less anxious and not irritable she was cooperative with her medication regimen. 15 minutes safety checks maintained will continue to monitor closely.

## 2022-05-09 NOTE — Progress Notes (Signed)
Pt Alert, able to follow commands. Pt is guarded when nurse asks questions, complaint with morning medications. Denies SI/HI, no AVH expressed. Pt is visible and present for meals. Safety rounds maintained.

## 2022-05-09 NOTE — Progress Notes (Signed)
[facility] MD Progress Note  05/09/2022 11:12 AM [name]  MRN:  [id number] Subjective:   Patient was seen in her room.  She is agreeable to me in the office.  Mood wise she reports "I am just here."  Says that she slept okay.  Speaks fairly passionately about the fact that she was wrongfully admitted.  Talks about the government trying to control her grandmother in martial law.  Also mentions "vaporizing life" and "temporary life."  States that she has be released.  "I do not need medications."  She is taking them.  Subjectively denies hallucinations and delusions.  Principal Problem: Schizoaffective disorder, depressive type (HCC) Diagnosis: Principal Problem:   Schizoaffective disorder, depressive type (HCC)  Total Time spent with patient: 29 minutes  Past Psychiatric History: schizophrenia  Past Medical History:  Past Medical History:  Diagnosis Date   Anemia 10/02/2021   Non compliance w medication regimen    Schizophrenia [facility])     Past Surgical History:  Procedure Laterality Date   BIOPSY  09/25/2021   Procedure: BIOPSY;  Surgeon: Rachael Fee, MD;  Location: [facility];  Service: Gastroenterology;;   ESOPHAGOGASTRODUODENOSCOPY (EGD) WITH PROPOFOL [name] 09/25/2021   Procedure: ESOPHAGOGASTRODUODENOSCOPY (EGD) WITH PROPOFOL;  Surgeon: Rachael Fee, MD;  Location: [facility];  Service: Gastroenterology;  Laterality: [name];  With NGT placement   Family History: History reviewed. No pertinent family history. Family Psychiatric  History: none Social History:  Social History   Substance and Sexual Activity  Alcohol Use Not Currently   Comment: Refuses to disclose how much     Social History   Substance and Sexual Activity  Drug Use No   Comment: Refuses to answer    Social History   Socioeconomic History   Marital status: Single    Spouse name: Not on file   Number of children: Not on file   Years of education: Not on file   Highest education level: Not on file   Occupational History   Not on file  Tobacco Use   Smoking status: Every Day    Packs/day: 2.00    Types: Cigarettes   Smokeless tobacco: Never  Substance and Sexual Activity   Alcohol use: Not Currently    Comment: Refuses to disclose how much   Drug use: No    Comment: Refuses to answer   Sexual activity: Not Currently    Comment: refused to answer  Other Topics Concern   Not on file  Social History Narrative   Not on file   Social Determinants of Health   Financial Resource Strain: Not on file  Food Insecurity: No Food Insecurity (04/08/2022)   Hunger Vital Sign    Worried About Running Out of Food in the Last Year: Never true    Ran Out of Food in the Last Year: Never true  Transportation Needs: No Transportation Needs (04/08/2022)   PRAPARE - Administrator, Civil Service (Medical): No    Lack of Transportation (Non-Medical): No  Physical Activity: Not on file  Stress: Not on file  Social Connections: Not on file   Additional Social History:                         Sleep: Fair  Appetite:  Fair  Current Medications: Current Facility-Administered Medications  Medication Dose Route Frequency Provider Last Rate Last Admin   acetaminophen (TYLENOL) tablet 650 mg  650 mg Oral Q6H PRN [name], [name], MD  alum & mag hydroxide-simeth (MAALOX/MYLANTA) 200-200-20 MG/5ML suspension 30 mL  30 mL Oral Q4H PRN [name], [name], MD       haloperidol (HALDOL) tablet 10 mg  10 mg Oral BID [name], [name], MD   10 mg at 05/09/22 1610   Or   haloperidol lactate (HALDOL) injection 5 mg  5 mg Intramuscular BID [name], [name], MD       lithium carbonate capsule 300 mg  300 mg Oral QHS [name], [name], MD   300 mg at 05/08/22 2243   LORazepam (ATIVAN) tablet 1 mg  1 mg Oral BID [name], [name], MD   1 mg at 05/09/22 9604   Or   LORazepam (ATIVAN) injection 1 mg  1 mg Intramuscular BID [name], [name], MD       magnesium hydroxide (MILK OF MAGNESIA)  suspension 30 mL  30 mL Oral Daily PRN [name], [name], MD       mirtazapine (REMERON) tablet 30 mg  30 mg Oral QHS [name], [name], MD   30 mg at 05/08/22 2242   multivitamin with minerals tablet 1 tablet  1 tablet Oral Daily [name], [name], MD   1 tablet at 05/09/22 0815   OLANZapine (ZYPREXA) tablet 30 mg  30 mg Oral QHS [name], [name], MD   30 mg at 05/08/22 2242   thiamine (VITAMIN B1) tablet 100 mg  100 mg Oral Daily [name], [name], MD   100 mg at 05/09/22 0815    Lab Results:  Results for orders placed or performed during the hospital encounter of 04/08/22 (from the past 48 hour(s))  Basic metabolic panel     Status: Abnormal   Collection Time: 05/07/22  5:52 PM  Result Value Ref Range   Sodium 135 135 - 145 mmol/L   Potassium 4.3 3.5 - 5.1 mmol/L   Chloride 97 (L) 98 - 111 mmol/L   CO2 29 22 - 32 mmol/L   Glucose, Bld 108 (H) 70 - 99 mg/dL    Comment: Glucose reference range applies only to samples taken after fasting for at least 8 hours.   BUN 31 (H) 8 - 23 mg/dL   Creatinine, Ser 0.86 0.44 - 1.00 mg/dL   Calcium 9.1 8.9 - 10.3 mg/dL   GFR, Estimated >60 >60 mL/min    Comment: (NOTE) Calculated using the CKD-EPI Creatinine Equation (2021)    Anion gap 9 5 - 15    Comment: Performed at [facility], [address] [place], [place] [address]    Blood Alcohol level:  Lab Results  Component Value Date   [facility] <10 03/29/2022   ETH <10 11/91/4782    Metabolic Disorder Labs: Lab Results  Component Value Date   HGBA1C 5.6 10/21/2021   MPG 114 10/21/2021   MPG 111 10/02/2021   Lab Results  Component Value Date   PROLACTIN 13.6 04/16/2015   Lab Results  Component Value Date   CHOL 189 10/21/2021   TRIG 52 10/21/2021   HDL 50 10/21/2021   CHOLHDL 3.8 10/21/2021   VLDL 10 10/21/2021   LDLCALC 129 (H) 10/21/2021   LDLCALC 93 10/01/2020    Physical Findings: AIMS: Facial and Oral Movements Muscles of Facial Expression: None, normal Lips and  Perioral Area: None, normal Jaw: None, normal Tongue: None, normal,Extremity Movements Upper (arms, wrists, hands, fingers): None, normal Lower (legs, knees, ankles, toes): None, normal, Trunk Movements Neck, shoulders, hips: None, normal, Overall Severity Severity of abnormal  movements (highest score from questions above): None, normal Incapacitation due to abnormal movements: None, normal Patient's awareness of abnormal movements (rate only patient's report): No Awareness, Dental Status Current problems with teeth and/or dentures?: No Does patient usually wear dentures?: No  CIWA:    COWS:     Musculoskeletal: Strength & Muscle Tone: within normal limits Gait & Station: normal Patient leans: [name]  Psychiatric Specialty Exam:  Presentation  General Appearance:  Disheveled  Eye Contact: Fair  Speech: Pressured  Speech Volume: Increased  Handedness: Right   Mood and Affect  Mood: Irritable; Labile; Angry  Affect: Blunt; Labile   Thought Process  Thought Processes: Disorganized; Irrevelant  Descriptions of Associations:Loose  Orientation:Partial  Thought Content:Illogical; Rumination; Scattered; Tangential; Delusions  History of Schizophrenia/Schizoaffective disorder:Yes  Duration of Psychotic Symptoms:Greater than six months  Hallucinations:No data recorded Ideas of Reference:Percusatory; Paranoia  Suicidal Thoughts:No data recorded Homicidal Thoughts:No data recorded  Sensorium  Memory: Immediate Fair; Recent Fair; Remote Fair  Judgment: Impaired  Insight: Poor; Lacking   Executive Functions  Concentration: Poor  Attention Span: Poor  Recall: Poor  Fund of Knowledge: Poor  Language: Poor   Psychomotor Activity  Psychomotor Activity:No data recorded  Assets  Assets: Physical Health; Resilience; Housing; Social Support   Sleep  Sleep:No data recorded   Physical Exam: Physical Exam Vitals reviewed.   Constitutional:      Appearance: Normal appearance.  HENT:     Head: Normocephalic and atraumatic.     Mouth/Throat:     Pharynx: Oropharynx is clear.  Eyes:     Pupils: Pupils are equal, round, and reactive to light.  Cardiovascular:     Rate and Rhythm: Normal rate and regular rhythm.  Pulmonary:     Effort: Pulmonary effort is normal.     Breath sounds: Normal breath sounds.  Abdominal:     General: Abdomen is flat.     Palpations: Abdomen is soft.  Musculoskeletal:        General: Normal range of motion.  Skin:    General: Skin is warm and dry.  Neurological:     General: No focal deficit present.     Mental Status: She is alert. Mental status is at baseline.  Psychiatric:        Attention and Perception: She is inattentive.        Mood and Affect: Mood normal. Affect is blunt.        Speech: Speech is delayed.        Behavior: Behavior is slowed.        Thought Content: Thought content is paranoid and delusional.        Cognition and Memory: Cognition is impaired.        Judgment: Judgment is inappropriate.    Review of Systems  Constitutional: Negative.   HENT: Negative.    Eyes: Negative.   Respiratory: Negative.    Cardiovascular: Negative.   Gastrointestinal: Negative.   Musculoskeletal: Negative.   Skin: Negative.   Neurological: Negative.   Psychiatric/Behavioral:  The patient is nervous/anxious.    Blood pressure 95/67, pulse (!) 59, temperature 98.6 F (37 C), temperature source Oral, resp. rate 18, height 5\' 4"  (1.626 m), weight 40.4 kg, SpO2 100 %. Body mass index is 15.28 kg/m.   Treatment Plan Summary: Medication management and Plan chemistry all normal. Add lithium starting low at 300mg  qhs for mood disorder to try to improve insight and ability to work on discharge  10/21 No changes  [id number]  Reggie Pile, MD 05/09/2022, 11:12 AM

## 2022-05-10 DIAGNOSIS — F251 Schizoaffective disorder, depressive type: Secondary | ICD-10-CM | POA: Diagnosis not present

## 2022-05-10 NOTE — Progress Notes (Signed)
Patient alert and oriented x 4 affect is flat but brightens upon approach she denies SI/HI/AVH she appears less anxious and is interacting appropriately with peers and staff. Patient took her nighttime medications 15 minutes safety checks maintained

## 2022-05-10 NOTE — [facility] Group Notes (Signed)
[facility] Group Notes:  (Nursing/MHT/Case Management/Adjunct)  Date:  05/10/2022  Time:  8:39 PM  Type of Therapy:   Wrap up  Participation Level:  Did Not Attend   [name] 05/10/2022, 8:39 PM

## 2022-05-10 NOTE — Progress Notes (Addendum)
[facility] MD Progress Note  05/10/2022 9:14 AM [name]  MRN:  [id number] Subjective:   10/22 Patient is seen in her room.  Reports no updates or sharing points over the past 24 hours.  Tells me that she is waiting on the governor to discharge her as the government put her in here in the first place.  Frustrated that people do not understand "vaporizing lives." "It's all over."  She slept okay, reports that she would rather sleep in her own bed and is ready to be discharged.  Appetite is good.Denies any medical concerns.  10/21 Patient was seen in her room.  She is agreeable to me in the office.  Mood wise she reports "I am just here."  Says that she slept okay.  Speaks fairly passionately about the fact that she was wrongfully admitted.  Talks about the government trying to control her grandmother in martial law.  Also mentions "vaporizing life" and "temporary life."  States that she has be released.  "I do not need medications."  She is taking them.  Subjectively denies hallucinations and delusions.  Principal Problem: Schizoaffective disorder, depressive type (HCC) Diagnosis: Principal Problem:   Schizoaffective disorder, depressive type (HCC)  Total Time spent with patient: 26 minutes  Past Psychiatric History: schizophrenia  Past Medical History:  Past Medical History:  Diagnosis Date   Anemia 10/02/2021   Non compliance w medication regimen    Schizophrenia [facility])     Past Surgical History:  Procedure Laterality Date   BIOPSY  09/25/2021   Procedure: BIOPSY;  Surgeon: Rachael Fee, MD;  Location: [facility];  Service: Gastroenterology;;   ESOPHAGOGASTRODUODENOSCOPY (EGD) WITH PROPOFOL [name] 09/25/2021   Procedure: ESOPHAGOGASTRODUODENOSCOPY (EGD) WITH PROPOFOL;  Surgeon: Rachael Fee, MD;  Location: [facility];  Service: Gastroenterology;  Laterality: [name];  With NGT placement   Family History: History reviewed. No pertinent family history. Family Psychiatric  History:  none Social History:  Social History   Substance and Sexual Activity  Alcohol Use Not Currently   Comment: Refuses to disclose how much     Social History   Substance and Sexual Activity  Drug Use No   Comment: Refuses to answer    Social History   Socioeconomic History   Marital status: Single    Spouse name: Not on file   Number of children: Not on file   Years of education: Not on file   Highest education level: Not on file  Occupational History   Not on file  Tobacco Use   Smoking status: Every Day    Packs/day: 2.00    Types: Cigarettes   Smokeless tobacco: Never  Substance and Sexual Activity   Alcohol use: Not Currently    Comment: Refuses to disclose how much   Drug use: No    Comment: Refuses to answer   Sexual activity: Not Currently    Comment: refused to answer  Other Topics Concern   Not on file  Social History Narrative   Not on file   Social Determinants of Health   Financial Resource Strain: Not on file  Food Insecurity: No Food Insecurity (04/08/2022)   Hunger Vital Sign    Worried About Running Out of Food in the Last Year: Never true    Ran Out of Food in the Last Year: Never true  Transportation Needs: No Transportation Needs (04/08/2022)   PRAPARE - Administrator, Civil Service (Medical): No    Lack of Transportation (Non-Medical): No  Physical Activity: Not on file  Stress: Not on file  Social Connections: Not on file   Additional Social History:                         Sleep: Fair  Appetite:  Fair  Current Medications: Current Facility-Administered Medications  Medication Dose Route Frequency Provider Last Rate Last Admin   acetaminophen (TYLENOL) tablet 650 mg  650 mg Oral Q6H PRN [name], [name], MD       alum & mag hydroxide-simeth (MAALOX/MYLANTA) 200-200-20 MG/5ML suspension 30 mL  30 mL Oral Q4H PRN [name], [name], MD       haloperidol (HALDOL) tablet 10 mg  10 mg Oral BID [name], [name], MD   10 mg  at 05/10/22 1308   Or   haloperidol lactate (HALDOL) injection 5 mg  5 mg Intramuscular BID [name], [name], MD       lithium carbonate capsule 300 mg  300 mg Oral QHS [name], [name], MD   300 mg at 05/09/22 2100   LORazepam (ATIVAN) tablet 1 mg  1 mg Oral BID [name], [name], MD   1 mg at 05/10/22 6578   Or   LORazepam (ATIVAN) injection 1 mg  1 mg Intramuscular BID [name], [name], MD       magnesium hydroxide (MILK OF MAGNESIA) suspension 30 mL  30 mL Oral Daily PRN [name], [name], MD       mirtazapine (REMERON) tablet 30 mg  30 mg Oral QHS [name], [name], MD   30 mg at 05/09/22 2100   multivitamin with minerals tablet 1 tablet  1 tablet Oral Daily [name], [name], MD   1 tablet at 05/10/22 0759   OLANZapine (ZYPREXA) tablet 30 mg  30 mg Oral QHS [name], [name], MD   30 mg at 05/09/22 2100   thiamine (VITAMIN B1) tablet 100 mg  100 mg Oral Daily [name], [name], MD   100 mg at 05/10/22 4696    Lab Results:  No results found for this or any previous visit (from the past 48 hour(s)).   Blood Alcohol level:  Lab Results  Component Value Date   ETH <10 03/29/2022   ETH <10 09/12/2021    Metabolic Disorder Labs: Lab Results  Component Value Date   HGBA1C 5.6 10/21/2021   MPG 114 10/21/2021   MPG 111 10/02/2021   Lab Results  Component Value Date   PROLACTIN 13.6 04/16/2015   Lab Results  Component Value Date   CHOL 189 10/21/2021   TRIG 52 10/21/2021   HDL 50 10/21/2021   CHOLHDL 3.8 10/21/2021   VLDL 10 10/21/2021   LDLCALC 129 (H) 10/21/2021   LDLCALC 93 10/01/2020    Physical Findings: AIMS: Facial and Oral Movements Muscles of Facial Expression: None, normal Lips and Perioral Area: None, normal Jaw: None, normal Tongue: None, normal,Extremity Movements Upper (arms, wrists, hands, fingers): None, normal Lower (legs, knees, ankles, toes): None, normal, Trunk Movements Neck, shoulders, hips: None, normal, Overall Severity Severity of abnormal movements  (highest score from questions above): None, normal Incapacitation due to abnormal movements: None, normal Patient's awareness of abnormal movements (rate only patient's report): No Awareness, Dental Status Current problems with teeth and/or dentures?: No Does patient usually wear dentures?: No  CIWA:    COWS:     Musculoskeletal: Strength & Muscle Tone: within normal limits Gait & Station: normal Patient leans: [name]  Psychiatric Specialty  Exam:  Presentation  General Appearance:  Disheveled  Eye Contact: poor  Speech: wnl  Handedness: Right   Mood and Affect  Mood: Irritable; Labile; Angry  Affect: Blunt; Labile   Thought Process  Thought Processes: Disorganized; Irrevelant  Descriptions of Associations:Loose  Orientation:Partial  Thought Content:Illogical; Rumination; Scattered; Tangential; Delusions  History of Schizophrenia/Schizoaffective disorder:Yes  Duration of Psychotic Symptoms:Greater than six months  Hallucinations:No data recorded Ideas of Reference:Percusatory; Paranoia  Suicidal Thoughts:No data recorded Homicidal Thoughts:No data recorded  Sensorium  Memory: Immediate Fair; Recent Fair; Remote Fair  Judgment: Impaired  Insight: Poor; Lacking   Executive Functions  Concentration: Poor  Attention Span: Poor  Recall: Poor  Fund of Knowledge: Poor  Language: Poor   Psychomotor Activity  Psychomotor Activity:No data recorded  Assets  Assets: Physical Health; Resilience; Housing; Social Support   Sleep  Sleep:No data recorded   Review of Systems  Constitutional: Negative.   HENT: Negative.    Eyes: Negative.   Respiratory: Negative.    Cardiovascular: Negative.   Gastrointestinal: Negative.   Musculoskeletal: Negative.   Skin: Negative.   Neurological: Negative.   Psychiatric/Behavioral:  The patient is nervous/anxious.    Blood pressure 96/65, pulse (!) 57, temperature 98.5 F (36.9 C), temperature  source Oral, resp. rate 18, height 5\' 4"  (1.626 m), weight 40.4 kg, SpO2 100 %. Body mass index is 15.28 kg/m.   Treatment Plan Summary: Medication management and Plan chemistry all normal. Add lithium starting low at 300mg  qhs for mood disorder to try to improve insight and ability to work on discharge  10/21 No changes  10/22 No changes  [id number]  Rulon Sera, MD 05/10/2022, 9:14 AM

## 2022-05-10 NOTE — Plan of Care (Signed)
D: Patient alert and oriented. Patient denies pain. Patient denies anxiety and depression. Patient denies SI/HI/AVH. Patient has been isolative to room for majority of shift with exception to coming out for meals and medication.   A: Scheduled medications administered to patient, per MD orders.  Support and encouragement provided to patient.  Q15 minute safety checks maintained.   R: Patient compliant with medication administration and treatment plan. No adverse drug reactions noted. Patient remains safe on the unit at this time.  Problem: Education: Goal: Knowledge of [place] [facility] Education information/materials will improve Outcome: Progressing Goal: Verbalization of understanding the information provided will improve Outcome: Progressing   Problem: Health Behavior/Discharge Planning: Goal: Compliance with treatment plan for underlying cause of condition will improve Outcome: Progressing   Problem: Physical Regulation: Goal: Ability to maintain clinical measurements within normal limits will improve Outcome: Progressing   Problem: Safety: Goal: Periods of time without injury will increase Outcome: Progressing

## 2022-05-10 NOTE — BH IP Treatment Plan (Signed)
Interdisciplinary Treatment and Diagnostic Plan Update  05/10/2022 Time of Session: 1000 [name] MRN: [id number]  Principal Diagnosis: Schizoaffective disorder, depressive type (HCC)  Secondary Diagnoses: Principal Problem:   Schizoaffective disorder, depressive type (HCC)   Current Medications:  Current Facility-Administered Medications  Medication Dose Route Frequency Provider Last Rate Last Admin   acetaminophen (TYLENOL) tablet 650 mg  650 mg Oral Q6H PRN [name], [name], MD       alum & mag hydroxide-simeth (MAALOX/MYLANTA) 200-200-20 MG/5ML suspension 30 mL  30 mL Oral Q4H PRN [name], [name], MD       haloperidol (HALDOL) tablet 10 mg  10 mg Oral BID [name], [name], MD   10 mg at 05/10/22 1497   Or   haloperidol lactate (HALDOL) injection 5 mg  5 mg Intramuscular BID [name], [name], MD       lithium carbonate capsule 300 mg  300 mg Oral QHS [name], [name], MD   300 mg at 05/09/22 2100   LORazepam (ATIVAN) tablet 1 mg  1 mg Oral BID [name], [name], MD   1 mg at 05/10/22 0263   Or   LORazepam (ATIVAN) injection 1 mg  1 mg Intramuscular BID [name], [name], MD       magnesium hydroxide (MILK OF MAGNESIA) suspension 30 mL  30 mL Oral Daily PRN [name], [name], MD       mirtazapine (REMERON) tablet 30 mg  30 mg Oral QHS [name], [name], MD   30 mg at 05/09/22 2100   multivitamin with minerals tablet 1 tablet  1 tablet Oral Daily [name], [name], MD   1 tablet at 05/10/22 0759   OLANZapine (ZYPREXA) tablet 30 mg  30 mg Oral QHS [name], [name], MD   30 mg at 05/09/22 2100   thiamine (VITAMIN B1) tablet 100 mg  100 mg Oral Daily [name], [name], MD   100 mg at 05/10/22 0759   PTA Medications: Medications Prior to Admission  Medication Sig Dispense Refill Last Dose   clonazePAM (KLONOPIN) 0.5 MG tablet Take 0.5 tablets (0.25 mg total) by mouth 2 (two) times daily. (Patient not taking: Reported on 03/30/2022) 60 tablet 0    haloperidol (HALDOL) 5 MG tablet Take 1 tablet (5 mg  total) by mouth 2 (two) times daily. (Patient not taking: Reported on 03/30/2022) 60 tablet 3    midodrine (PROAMATINE) 2.5 MG tablet Take 1 tablet (2.5 mg total) by mouth 3 (three) times daily with meals. (Patient not taking: Reported on 03/30/2022)  0    mirtazapine (REMERON) 15 MG tablet Take 0.5 tablets (7.5 mg total) by mouth at bedtime. (Patient not taking: Reported on 03/30/2022) 30 tablet 2    Multiple Vitamin (MULTIVITAMIN WITH MINERALS) TABS tablet Take 1 tablet by mouth daily. (Patient not taking: Reported on 03/30/2022)      OLANZapine (ZYPREXA) 5 MG tablet Take 1 tablet (5 mg total) by mouth at bedtime. (Patient not taking: Reported on 03/30/2022) 30 tablet 2    thiamine 100 MG tablet Take 1 tablet (100 mg total) by mouth daily. (Patient not taking: Reported on 03/30/2022)       Patient Stressors: Marital or family conflict    Patient Strengths: Supportive family/friends   Treatment Modalities: Medication Management, Group therapy, Case management,  1 to 1 session with clinician, Psychoeducation, Recreational therapy.   Physician Treatment Plan for Primary Diagnosis: Schizoaffective disorder, depressive type (HCC) Long Term Goal(s): Improvement in symptoms so as ready for  discharge   Short Term Goals: Compliance with prescribed medications will improve Ability to verbalize feelings will improve Ability to demonstrate self-control will improve Ability to identify and develop effective coping behaviors will improve  Medication Management: Evaluate patient's response, side effects, and tolerance of medication regimen.  Therapeutic Interventions: 1 to 1 sessions, Unit Group sessions and Medication administration.  Evaluation of Outcomes: Progressing  Physician Treatment Plan for Secondary Diagnosis: Principal Problem:   Schizoaffective disorder, depressive type ([place])  Long Term Goal(s): Improvement in symptoms so as ready for discharge   Short Term Goals: Compliance with  prescribed medications will improve Ability to verbalize feelings will improve Ability to demonstrate self-control will improve Ability to identify and develop effective coping behaviors will improve     Medication Management: Evaluate patient's response, side effects, and tolerance of medication regimen.  Therapeutic Interventions: 1 to 1 sessions, Unit Group sessions and Medication administration.  Evaluation of Outcomes: Progressing   RN Treatment Plan for Primary Diagnosis: Schizoaffective disorder, depressive type ([place]) Long Term Goal(s): Knowledge of disease and therapeutic regimen to maintain health will improve  Short Term Goals: Ability to identify and develop effective coping behaviors will improve and Compliance with prescribed medications will improve  Medication Management: RN will administer medications as ordered by provider, will assess and evaluate patient's response and provide education to patient for prescribed medication. RN will report any adverse and/or side effects to prescribing provider.  Therapeutic Interventions: 1 on 1 counseling sessions, Psychoeducation, Medication administration, Evaluate responses to treatment, Monitor vital signs and CBGs as ordered, Perform/monitor CIWA, COWS, AIMS and Fall Risk screenings as ordered, Perform wound care treatments as ordered.  Evaluation of Outcomes: Progressing   LCSW Treatment Plan for Primary Diagnosis: Schizoaffective disorder, depressive type ([place]) Long Term Goal(s): Safe transition to appropriate next level of care at discharge, Engage patient in therapeutic group addressing interpersonal concerns.  Short Term Goals: Engage patient in aftercare planning with referrals and resources, Facilitate acceptance of mental health diagnosis and concerns, and Increase skills for wellness and recovery  Therapeutic Interventions: Assess for all discharge needs, 1 to 1 time with Social worker, Explore available resources and  support systems, Assess for adequacy in community support network, Educate family and significant other(s) on suicide prevention, Complete Psychosocial Assessment, Interpersonal group therapy.  Evaluation of Outcomes: Progressing   Progress in Treatment: Attending groups: No. Participating in groups: No. Taking medication as prescribed: Yes. Toleration medication: Yes. Family/Significant other contact made: Yes, individual(s) contacted:  with legal guardian Patient understands diagnosis: Yes. Discussing patient identified problems/goals with staff: Yes. Medical problems stabilized or resolved: Yes. Denies suicidal/homicidal ideation: Yes. Issues/concerns per patient self-inventory: No. Other: none  New problem(s) identified: No, Describe:  none  New Short Term/Long Term Goal(s):none  Patient Goals:    Discharge Plan or Barriers: will return to her ACT team  Reason for Continuation of Hospitalization: Medication stabilization  Estimated Length of Stay: 3-5 days  Last [place] Suicide Severity Risk Score: Flowsheet Row Admission (Current) from 04/08/2022 in [facility] Admission (Discharged) from 10/17/2021 in [facility] Admission (Discharged) from 10/02/2021 in [facility]  C-SSRS RISK CATEGORY No Risk No Risk No Risk       Last PHQ 2/9 Scores:     No data to display          Scribe for Treatment Team: Joanne Chars, [place] 05/10/2022 10:11 AM

## 2022-05-11 DIAGNOSIS — F251 Schizoaffective disorder, depressive type: Secondary | ICD-10-CM | POA: Diagnosis not present

## 2022-05-11 MED ORDER — LITHIUM CARBONATE 300 MG PO CAPS
600.0000 mg | ORAL_CAPSULE | Freq: Every day | ORAL | Status: DC
  Administered 2022-05-11 – 2022-08-06 (×88): 600 mg via ORAL
  Filled 2022-05-11 (×89): qty 2

## 2022-05-11 NOTE — Progress Notes (Signed)
[facility] MD Progress Note  05/11/2022 2:53 PM [name]  MRN:  [id number] Subjective: Patient seen and chart reviewed.  61 year old woman with schizoaffective disorder.  Patient is eating adequately and takes care of her basic hygiene but otherwise stays almost completely isolated to her room with the lights off.  When approached she will always just ask to be discharged.  Once again tried to bring up the subject of getting along with her sister and mother at home and as usual this just set her off.  Patient began pacing and ranting and insisting that her family had gone away in a spaceship etc. could not be coaxed back into any kind of rational conversation.  So far seems to be tolerating medication including the little dose of lithium I started the other day although does not seem to have had much benefit Principal Problem: Schizoaffective disorder, depressive type ([place]) Diagnosis: Principal Problem:   Schizoaffective disorder, depressive type ([place])  Total Time spent with patient: 30 minutes  Past Psychiatric History: Past history of schizoaffective disorder  Past Medical History:  Past Medical History:  Diagnosis Date   Anemia 10/02/2021   Non compliance w medication regimen    Schizophrenia ([place])     Past Surgical History:  Procedure Laterality Date   BIOPSY  09/25/2021   Procedure: BIOPSY;  Surgeon: Milus Banister, MD;  Location: [facility];  Service: Gastroenterology;;   ESOPHAGOGASTRODUODENOSCOPY (EGD) WITH PROPOFOL [name] 09/25/2021   Procedure: ESOPHAGOGASTRODUODENOSCOPY (EGD) WITH PROPOFOL;  Surgeon: Milus Banister, MD;  Location: [facility];  Service: Gastroenterology;  Laterality: [name];  With NGT placement   Family History: History reviewed. No pertinent family history. Family Psychiatric  History: See previous Social History:  Social History   Substance and Sexual Activity  Alcohol Use Not Currently   Comment: Refuses to disclose how much     Social History   Substance  and Sexual Activity  Drug Use No   Comment: Refuses to answer    Social History   Socioeconomic History   Marital status: Single    Spouse name: Not on file   Number of children: Not on file   Years of education: Not on file   Highest education level: Not on file  Occupational History   Not on file  Tobacco Use   Smoking status: Every Day    Packs/day: 2.00    Types: Cigarettes   Smokeless tobacco: Never  Substance and Sexual Activity   Alcohol use: Not Currently    Comment: Refuses to disclose how much   Drug use: No    Comment: Refuses to answer   Sexual activity: Not Currently    Comment: refused to answer  Other Topics Concern   Not on file  Social History Narrative   Not on file   Social Determinants of Health   Financial Resource Strain: Not on file  Food Insecurity: No Food Insecurity (04/08/2022)   Hunger Vital Sign    Worried About Running Out of Food in the Last Year: Never true    Ran Out of Food in the Last Year: Never true  Transportation Needs: No Transportation Needs (04/08/2022)   PRAPARE - Hydrologist (Medical): No    Lack of Transportation (Non-Medical): No  Physical Activity: Not on file  Stress: Not on file  Social Connections: Not on file   Additional Social History:  Sleep: Fair  Appetite:  Fair  Current Medications: Current Facility-Administered Medications  Medication Dose Route Frequency Provider Last Rate Last Admin   acetaminophen (TYLENOL) tablet 650 mg  650 mg Oral Q6H PRN Vici Novick T, MD       alum & mag hydroxide-simeth (MAALOX/MYLANTA) 200-200-20 MG/5ML suspension 30 mL  30 mL Oral Q4H PRN Rhonda Linan T, MD       haloperidol (HALDOL) tablet 10 mg  10 mg Oral BID Billie Intriago T, MD   10 mg at 05/11/22 [id number]   Or   haloperidol lactate (HALDOL) injection 5 mg  5 mg Intramuscular BID Ayana Imhof T, MD       lithium carbonate capsule 600 mg  600 mg Oral QHS  Amylah Will T, MD       LORazepam (ATIVAN) tablet 1 mg  1 mg Oral BID Traeson Dusza T, MD   1 mg at 05/11/22 [id number]   Or   LORazepam (ATIVAN) injection 1 mg  1 mg Intramuscular BID Susy Placzek T, MD       magnesium hydroxide (MILK OF MAGNESIA) suspension 30 mL  30 mL Oral Daily PRN Eidan Muellner T, MD       mirtazapine (REMERON) tablet 30 mg  30 mg Oral QHS Zerline Melchior T, MD   30 mg at 05/10/22 2107   multivitamin with minerals tablet 1 tablet  1 tablet Oral Daily Deagan Sevin, [name], MD   1 tablet at 05/11/22 0817   OLANZapine (ZYPREXA) tablet 30 mg  30 mg Oral QHS Shantrell Placzek T, MD   30 mg at 05/10/22 2107   thiamine (VITAMIN B1) tablet 100 mg  100 mg Oral Daily Dulcey Riederer, [name], MD   100 mg at 05/11/22 [id number]    Lab Results: No results found for this or any previous visit (from the past 48 hour(s)).  Blood Alcohol level:  Lab Results  Component Value Date   ETH <10 03/29/2022   ETH <10 123XX123    Metabolic Disorder Labs: Lab Results  Component Value Date   HGBA1C 5.6 10/21/2021   MPG 114 10/21/2021   MPG 111 10/02/2021   Lab Results  Component Value Date   PROLACTIN 13.6 04/16/2015   Lab Results  Component Value Date   CHOL 189 10/21/2021   TRIG 52 10/21/2021   HDL 50 10/21/2021   CHOLHDL 3.8 10/21/2021   VLDL 10 10/21/2021   LDLCALC 129 (H) 10/21/2021   LDLCALC 93 10/01/2020    Physical Findings: AIMS: Facial and Oral Movements Muscles of Facial Expression: None, normal Lips and Perioral Area: None, normal Jaw: None, normal Tongue: None, normal,Extremity Movements Upper (arms, wrists, hands, fingers): None, normal Lower (legs, knees, ankles, toes): None, normal, Trunk Movements Neck, shoulders, hips: None, normal, Overall Severity Severity of abnormal movements (highest score from questions above): None, normal Incapacitation due to abnormal movements: None, normal Patient's awareness of abnormal movements (rate only patient's report): No Awareness, Dental  Status Current problems with teeth and/or dentures?: No Does patient usually wear dentures?: No  CIWA:    COWS:     Musculoskeletal: Strength & Muscle Tone: within normal limits Gait & Station: normal Patient leans: [name]  Psychiatric Specialty Exam:  Presentation  General Appearance:  Disheveled  Eye Contact: Fair  Speech: Pressured  Speech Volume: Increased  Handedness: Right   Mood and Affect  Mood: Irritable; Labile; Angry  Affect: Blunt; Labile   Thought Process  Thought Processes: Disorganized; Irrevelant  Descriptions of Associations:Loose  Orientation:Partial  Thought Content:Illogical; Rumination; Scattered; Tangential; Delusions  History of Schizophrenia/Schizoaffective disorder:Yes  Duration of Psychotic Symptoms:Greater than six months  Hallucinations:No data recorded Ideas of Reference:Percusatory; Paranoia  Suicidal Thoughts:No data recorded Homicidal Thoughts:No data recorded  Sensorium  Memory: Immediate Fair; Recent Fair; Remote Fair  Judgment: Impaired  Insight: Poor; Lacking   Executive Functions  Concentration: Poor  Attention Span: Poor  Recall: Poor  Fund of Knowledge: Poor  Language: Poor   Psychomotor Activity  Psychomotor Activity:No data recorded  Assets  Assets: Physical Health; Resilience; Housing; Social Support   Sleep  Sleep:No data recorded   Physical Exam: Physical Exam Vitals reviewed.  Constitutional:      Appearance: Normal appearance.  HENT:     Head: Normocephalic and atraumatic.     Mouth/Throat:     Pharynx: Oropharynx is clear.  Eyes:     Pupils: Pupils are equal, round, and reactive to light.  Cardiovascular:     Rate and Rhythm: Normal rate and regular rhythm.  Pulmonary:     Effort: Pulmonary effort is normal.     Breath sounds: Normal breath sounds.  Abdominal:     General: Abdomen is flat.     Palpations: Abdomen is soft.  Musculoskeletal:        General:  Normal range of motion.  Skin:    General: Skin is warm and dry.  Neurological:     General: No focal deficit present.     Mental Status: She is alert. Mental status is at baseline.  Psychiatric:        Attention and Perception: She is inattentive.        Mood and Affect: Mood normal. Affect is angry.        Speech: Speech is rapid and pressured.        Behavior: Behavior is agitated.        Thought Content: Thought content is paranoid and delusional.        Cognition and Memory: Memory is impaired.    Review of Systems  Constitutional: Negative.   HENT: Negative.    Eyes: Negative.   Respiratory: Negative.    Cardiovascular: Negative.   Gastrointestinal: Negative.   Musculoskeletal: Negative.   Skin: Negative.   Neurological: Negative.   Psychiatric/Behavioral:  Negative for depression, substance abuse and suicidal ideas. The patient is nervous/anxious.    Blood pressure (!) 100/59, pulse (!) 55, temperature 98.8 F (37.1 C), temperature source Oral, resp. rate 18, height 5\' 4"  (1.626 m), weight 40.4 kg, SpO2 100 %. Body mass index is 15.28 kg/m.   Treatment Plan Summary: Medication management and Plan increasing lithium to 600 mg at night.  Continue other antipsychotics.  Will check in regularly and keep trying to see if I can get her to talk in a frame of mind that is less hospital so that maybe her family would allow her to come home.  Alethia Berthold, MD 05/11/2022, 2:53 PM

## 2022-05-11 NOTE — Plan of Care (Signed)
D: Patient alert and oriented. Patient denies pain. Patient denies anxiety and depression. Patient denies SI/HI/AVH. Patient remains isolative to room with exception to coming out for meals and medication.  A: Scheduled medications administered to patient, per MD orders.  Support and encouragement provided to patient.  Q15 minute safety checks maintained.   R: Patient compliant with medication administration and treatment plan. No adverse drug reactions noted. Patient remains safe on the unit at this time.  Problem: Education: Goal: Knowledge of [facility] Education information/materials will improve Outcome: Progressing Goal: Verbalization of understanding the information provided will improve Outcome: Progressing   Problem: Health Behavior/Discharge Planning: Goal: Compliance with treatment plan for underlying cause of condition will improve Outcome: Progressing   Problem: Physical Regulation: Goal: Ability to maintain clinical measurements within normal limits will improve Outcome: Progressing   Problem: Safety: Goal: Periods of time without injury will increase Outcome: Progressing

## 2022-05-11 NOTE — Group Note (Signed)
[facility] LCSW Group Therapy Note    Group Date: 05/11/2022 Start Time: 1330 End Time: 1430  Type of Therapy and Topic:  Group Therapy:  Overcoming Obstacles  Participation Level:  [facility] PARTICIPATION LEVEL: Did Not Attend   Description of Group:   In this group patients will be encouraged to explore what they see as obstacles to their own wellness and recovery. They will be guided to discuss their thoughts, feelings, and behaviors related to these obstacles. The group will process together ways to cope with barriers, with attention given to specific choices patients can make. Each patient will be challenged to identify changes they are motivated to make in order to overcome their obstacles. This group will be process-oriented, with patients participating in exploration of their own experiences as well as giving and receiving support and challenge from other group members.  Therapeutic Goals: 1. Patient will identify personal and current obstacles as they relate to admission. 2. Patient will identify barriers that currently interfere with their wellness or overcoming obstacles.  3. Patient will identify feelings, thought process and behaviors related to these barriers. 4. Patient will identify two changes they are willing to make to overcome these obstacles:    Summary of Patient Progress X   Therapeutic Modalities:   Cognitive Behavioral Therapy Solution Focused Therapy Motivational Interviewing Relapse Prevention Therapy   Shirl Harris, LCSW

## 2022-05-11 NOTE — Progress Notes (Addendum)
Patient alert and oriented x 4 affect is blunted she forwards very little she appears less anxious and her thoughts are organized. Patient is complaint with medication regimen, 15 minutes safety checks maintained will continue .

## 2022-05-12 DIAGNOSIS — F251 Schizoaffective disorder, depressive type: Secondary | ICD-10-CM | POA: Diagnosis not present

## 2022-05-12 NOTE — Plan of Care (Signed)
  Problem: Education: Goal: Knowledge of General Education information will improve Description: Including pain rating scale, medication(s)/side effects and non-pharmacologic comfort measures Outcome: Progressing   Problem: Health Behavior/Discharge Planning: Goal: Ability to manage health-related needs will improve Outcome: Progressing   Problem: Clinical Measurements: Goal: Ability to maintain clinical measurements within normal limits will improve Outcome: Progressing Goal: Will remain free from infection Outcome: Progressing Goal: Diagnostic test results will improve Outcome: Progressing Goal: Respiratory complications will improve Outcome: Progressing Goal: Cardiovascular complication will be avoided Outcome: Progressing   Problem: Safety: Goal: Periods of time without injury will increase Outcome: Progressing   Problem: Physical Regulation: Goal: Ability to maintain clinical measurements within normal limits will improve Outcome: Progressing   Problem: Health Behavior/Discharge Planning: Goal: Identification of resources available to assist in meeting health care needs will improve Outcome: Progressing Goal: Compliance with treatment plan for underlying cause of condition will improve Outcome: Progressing   Problem: Coping: Goal: Ability to verbalize frustrations and anger appropriately will improve Outcome: Progressing Goal: Ability to demonstrate self-control will improve Outcome: Progressing   

## 2022-05-12 NOTE — Progress Notes (Signed)
Recreation Therapy Notes   Date: 05/12/2022  Time: 10:20 am   Location: Craft room       Behavioral response: [name]   Intervention Topic: Creative expressions   Discussion/Intervention: Patient refused to attend group.   Clinical Observations/Feedback:  Patient refused to attend group.    Kristyna Bradstreet LRT/CTRS         [name] 05/12/2022 1:06 PM

## 2022-05-12 NOTE — Plan of Care (Signed)
  Problem: Coping: Goal: Level of anxiety will decrease Outcome: Progressing   Problem: Safety: Goal: Ability to remain free from injury will improve Outcome: Progressing   Problem: Safety: Goal: Ability to redirect hostility and anger into socially appropriate behaviors will improve Outcome: Progressing

## 2022-05-12 NOTE — Progress Notes (Signed)
[facility] MD Progress Note  05/12/2022 12:04 PM [name]  MRN:  [id number] Subjective: Patient seen for follow-up.  Continues to be delusional and intermittently agitated.  Spends most of her time in her room or by herself but when you speak to her she will quickly get agitated and start rambling on her delusions.  No violence or aggression here in the hospital and has been compliant with medicine. Principal Problem: Schizoaffective disorder, depressive type (HCC) Diagnosis: Principal Problem:   Schizoaffective disorder, depressive type (HCC)  Total Time spent with patient: 30 minutes  Past Psychiatric History: Past history of schizophrenia  Past Medical History:  Past Medical History:  Diagnosis Date   Anemia 10/02/2021   Non compliance w medication regimen    Schizophrenia [facility])     Past Surgical History:  Procedure Laterality Date   BIOPSY  09/25/2021   Procedure: BIOPSY;  Surgeon: Rachael Fee, MD;  Location: [facility];  Service: Gastroenterology;;   ESOPHAGOGASTRODUODENOSCOPY (EGD) WITH PROPOFOL [name] 09/25/2021   Procedure: ESOPHAGOGASTRODUODENOSCOPY (EGD) WITH PROPOFOL;  Surgeon: Rachael Fee, MD;  Location: [facility];  Service: Gastroenterology;  Laterality: [name];  With NGT placement   Family History: History reviewed. No pertinent family history. Family Psychiatric  History: See previous Social History:  Social History   Substance and Sexual Activity  Alcohol Use Not Currently   Comment: Refuses to disclose how much     Social History   Substance and Sexual Activity  Drug Use No   Comment: Refuses to answer    Social History   Socioeconomic History   Marital status: Single    Spouse name: Not on file   Number of children: Not on file   Years of education: Not on file   Highest education level: Not on file  Occupational History   Not on file  Tobacco Use   Smoking status: Every Day    Packs/day: 2.00    Types: Cigarettes   Smokeless tobacco: Never   Substance and Sexual Activity   Alcohol use: Not Currently    Comment: Refuses to disclose how much   Drug use: No    Comment: Refuses to answer   Sexual activity: Not Currently    Comment: refused to answer  Other Topics Concern   Not on file  Social History Narrative   Not on file   Social Determinants of Health   Financial Resource Strain: Not on file  Food Insecurity: No Food Insecurity (04/08/2022)   Hunger Vital Sign    Worried About Running Out of Food in the Last Year: Never true    Ran Out of Food in the Last Year: Never true  Transportation Needs: No Transportation Needs (04/08/2022)   PRAPARE - Administrator, Civil Service (Medical): No    Lack of Transportation (Non-Medical): No  Physical Activity: Not on file  Stress: Not on file  Social Connections: Not on file   Additional Social History:                         Sleep: Fair  Appetite:  Fair  Current Medications: Current Facility-Administered Medications  Medication Dose Route Frequency Provider Last Rate Last Admin   acetaminophen (TYLENOL) tablet 650 mg  650 mg Oral Q6H PRN Zacharie Portner T, MD       alum & mag hydroxide-simeth (MAALOX/MYLANTA) 200-200-20 MG/5ML suspension 30 mL  30 mL Oral Q4H PRN Jaylise Peek, [name], MD  haloperidol (HALDOL) tablet 10 mg  10 mg Oral BID Rilley Stash T, MD   10 mg at 05/12/22 2263   Or   haloperidol lactate (HALDOL) injection 5 mg  5 mg Intramuscular BID Stephenson Cichy T, MD       lithium carbonate capsule 600 mg  600 mg Oral QHS Caprisha Bridgett T, MD   600 mg at 05/11/22 2106   LORazepam (ATIVAN) tablet 1 mg  1 mg Oral BID Artie Takayama T, MD   1 mg at 05/12/22 3354   Or   LORazepam (ATIVAN) injection 1 mg  1 mg Intramuscular BID Enyah Moman T, MD       magnesium hydroxide (MILK OF MAGNESIA) suspension 30 mL  30 mL Oral Daily PRN Ileana Chalupa T, MD       mirtazapine (REMERON) tablet 30 mg  30 mg Oral QHS Nakira Litzau T, MD   30 mg at 05/11/22  2106   multivitamin with minerals tablet 1 tablet  1 tablet Oral Daily Cherysh Epperly, [name], MD   1 tablet at 05/12/22 0755   OLANZapine (ZYPREXA) tablet 30 mg  30 mg Oral QHS Bellamy Rubey T, MD   30 mg at 05/11/22 2106   thiamine (VITAMIN B1) tablet 100 mg  100 mg Oral Daily Maurice Ramseur, [name], MD   100 mg at 05/12/22 5625    Lab Results: No results found for this or any previous visit (from the past 48 hour(s)).  Blood Alcohol level:  Lab Results  Component Value Date   ETH <10 03/29/2022   ETH <10 09/12/2021    Metabolic Disorder Labs: Lab Results  Component Value Date   HGBA1C 5.6 10/21/2021   MPG 114 10/21/2021   MPG 111 10/02/2021   Lab Results  Component Value Date   PROLACTIN 13.6 04/16/2015   Lab Results  Component Value Date   CHOL 189 10/21/2021   TRIG 52 10/21/2021   HDL 50 10/21/2021   CHOLHDL 3.8 10/21/2021   VLDL 10 10/21/2021   LDLCALC 129 (H) 10/21/2021   LDLCALC 93 10/01/2020    Physical Findings: AIMS: Facial and Oral Movements Muscles of Facial Expression: None, normal Lips and Perioral Area: None, normal Jaw: None, normal Tongue: None, normal,Extremity Movements Upper (arms, wrists, hands, fingers): None, normal Lower (legs, knees, ankles, toes): None, normal, Trunk Movements Neck, shoulders, hips: None, normal, Overall Severity Severity of abnormal movements (highest score from questions above): None, normal Incapacitation due to abnormal movements: None, normal Patient's awareness of abnormal movements (rate only patient's report): No Awareness, Dental Status Current problems with teeth and/or dentures?: No Does patient usually wear dentures?: No  CIWA:    COWS:     Musculoskeletal: Strength & Muscle Tone: within normal limits Gait & Station: normal Patient leans: [name]  Psychiatric Specialty Exam:  Presentation  General Appearance:  Disheveled  Eye Contact: Fair  Speech: Pressured  Speech  Volume: Increased  Handedness: Right   Mood and Affect  Mood: Irritable; Labile; Angry  Affect: Blunt; Labile   Thought Process  Thought Processes: Disorganized; Irrevelant  Descriptions of Associations:Loose  Orientation:Partial  Thought Content:Illogical; Rumination; Scattered; Tangential; Delusions  History of Schizophrenia/Schizoaffective disorder:Yes  Duration of Psychotic Symptoms:Greater than six months  Hallucinations:No data recorded Ideas of Reference:Percusatory; Paranoia  Suicidal Thoughts:No data recorded Homicidal Thoughts:No data recorded  Sensorium  Memory: Immediate Fair; Recent Fair; Remote Fair  Judgment: Impaired  Insight: Poor; Lacking   Executive Functions  Concentration: Poor  Attention Span: Poor  Recall:  Poor  Fund of Knowledge: Poor  Language: Poor   Psychomotor Activity  Psychomotor Activity:No data recorded  Assets  Assets: Physical Health; Resilience; Housing; Social Support   Sleep  Sleep:No data recorded   Physical Exam: Physical Exam Vitals and nursing note reviewed.  Constitutional:      Appearance: Normal appearance.  HENT:     Head: Normocephalic and atraumatic.     Mouth/Throat:     Pharynx: Oropharynx is clear.  Eyes:     Pupils: Pupils are equal, round, and reactive to light.  Cardiovascular:     Rate and Rhythm: Normal rate and regular rhythm.  Pulmonary:     Effort: Pulmonary effort is normal.     Breath sounds: Normal breath sounds.  Abdominal:     General: Abdomen is flat.     Palpations: Abdomen is soft.  Musculoskeletal:        General: Normal range of motion.  Skin:    General: Skin is warm and dry.  Neurological:     General: No focal deficit present.     Mental Status: She is alert. Mental status is at baseline.  Psychiatric:        Attention and Perception: She is inattentive.        Mood and Affect: Mood normal. Affect is angry and inappropriate.        Speech:  Speech is tangential.        Behavior: Behavior is agitated. Behavior is not aggressive.        Thought Content: Thought content is paranoid and delusional.        Cognition and Memory: Cognition is impaired.    Review of Systems  Constitutional: Negative.   HENT: Negative.    Eyes: Negative.   Respiratory: Negative.    Cardiovascular: Negative.   Gastrointestinal: Negative.   Musculoskeletal: Negative.   Skin: Negative.   Neurological: Negative.   Psychiatric/Behavioral:  The patient is nervous/anxious.    Blood pressure 110/67, pulse (!) 55, temperature 98 F (36.7 C), temperature source Oral, resp. rate 18, height 5\' 4"  (1.626 m), weight 40.4 kg, SpO2 100 %. Body mass index is 15.28 kg/m.   Treatment Plan Summary: Medication management and Plan I have increased her lithium dose to 600 mg at night.  We will see if with some time that makes any more difference to her mood irritability and argumentativeness that are preventing [place] from getting her discharge back home or perhaps even improves the psychosis.  Alethia Berthold, MD 05/12/2022, 12:04 PM

## 2022-05-12 NOTE — Group Note (Signed)
LCSW Group Therapy Note   Group Date: 05/12/2022 Start Time: 1300 End Time: 1400   Type of Therapy and Topic:  Group Therapy: Boundaries  Participation Level:  Did Not Attend  Description of Group: This group will address the use of boundaries in their personal lives. Patients will explore why boundaries are important, the difference between healthy and unhealthy boundaries, and negative and postive outcomes of different boundaries and will look at how boundaries can be crossed.  Patients will be encouraged to identify current boundaries in their own lives and identify what kind of boundary is being set. Facilitators will guide patients in utilizing problem-solving interventions to address and correct types boundaries being used and to address when no boundary is being used. Understanding and applying boundaries will be explored and addressed for obtaining and maintaining a balanced life. Patients will be encouraged to explore ways to assertively make their boundaries and needs known to significant others in their lives, using other group members and facilitator for role play, support, and feedback.  Therapeutic Goals:  1.  Patient will identify areas in their life where setting clear boundaries could be  used to improve their life.  2.  Patient will identify signs/triggers that a boundary is not being respected. 3.  Patient will identify two ways to set boundaries in order to achieve balance in  their lives: 4.  Patient will demonstrate ability to communicate their needs and set boundaries  through discussion and/or role plays  Summary of Patient Progress:   Patient did not attend group despite encouraged participation.   Therapeutic Modalities:   Cognitive Behavioral Therapy Solution-Focused Therapy  Yina Riviere W Kameisha Malicki, LCSWA 05/12/2022  3:43 PM    

## 2022-05-12 NOTE — Progress Notes (Signed)
Pt denies SI/HI/AVH and verbally agrees to approach staff if these become apparent or before harming themselves/others. Rates depression 0/10. Rates anxiety 0/10. Rates pain 0/10.  Scheduled medications administered to pt, per MD orders. RN provided support and encouragement to pt. Q15 min safety checks implemented and continued. Pt safe on the unit. RN will continue to monitor and intervene as needed.  Problem: Coping: Goal: Level of anxiety will decrease Outcome: Progressing   Problem: Elimination: Goal: Will not experience complications related to bowel motility Outcome: Progressing   05/12/22 0755  Psych Admission Type (Psych Patients Only)  Admission Status Involuntary  Psychosocial Assessment  Patient Complaints None  Eye Contact Brief  Facial Expression Flat  Affect Flat  Speech Logical/coherent  Interaction Minimal;Forwards little;Guarded  Motor Activity Slow  Appearance/Hygiene Unremarkable  Behavior Characteristics Cooperative;Appropriate to situation;Calm  Mood Apprehensive;Pleasant  Thought Process  Coherency WDL  Content WDL  Delusions None reported or observed  Perception WDL  Hallucination None reported or observed  Judgment Limited  Confusion None  Danger to Self  Current suicidal ideation? Denies  Danger to Others  Danger to Others None reported or observed

## 2022-05-12 NOTE — Progress Notes (Signed)
Patient presents with sad, flat affect. Isolative to self and room. No interaction with peers, no initiation with staff. Denies SI, HI, AVH. Encouragement and support provided. Safety checks maintained. Medications given as prescribed. Pt receptive and remains safe on unit with q 15 min checks.

## 2022-05-13 DIAGNOSIS — F251 Schizoaffective disorder, depressive type: Secondary | ICD-10-CM | POA: Diagnosis not present

## 2022-05-13 NOTE — [facility] Group Notes (Signed)
[place] Group Notes:  (Nursing/MHT/Case Management/Adjunct)  Date:  05/13/2022  Time:  9:50 AM  Type of Therapy:   Community Meeting  Participation Level:  Active  Participation Quality:  Appropriate and Attentive  Affect:  Appropriate  Cognitive:  Alert and Appropriate  Insight:  Appropriate  Engagement in Group:  Engaged  Modes of Intervention:  Discussion, Education, and Support  Summary of Progress/Problems:  [name] 05/13/2022, 9:50 AM

## 2022-05-13 NOTE — Progress Notes (Signed)
Recreation Therapy Notes  Date: 05/13/2022  Time: 2:00pm   Location: Courtyard    Behavioral response: [name]   Intervention Topic: Leisure   Discussion/Intervention: Patient refused to attend group.   Clinical Observations/Feedback:  Patient refused to attend group.    Ellijah Leffel LRT/CTRS        Reizy Dunlow 05/13/2022 3:47 PM

## 2022-05-13 NOTE — Progress Notes (Signed)
Isolative to her room this evening.  She is med compliant and received her meds without incident. She denies si/hi/avh .  Also denies depression and anxiety.  Has no issues or concerns this evening.  Will continue to monitor with q15 min safety checks.     C Butler-Nicholson, LPN

## 2022-05-13 NOTE — Progress Notes (Signed)
[facility] MD Progress Note  05/13/2022 12:35 PM [name]  MRN:  [id number]  Subjective: Patient seen for follow-up after notes reviewed and discussed in the morning meeting. Met pt in her room this AM for assessment; pt is ambulatory, without assistance, with steady gait; not making eye contact during interview; when asked how she was feeling, she states that she is "living the vaporilife"; pt unable to elaborate on this and states that she just wants to leave and go home. When asked to elaborate on her home situation, she does not, states that she "doesn't want to be bothered and likes to be left alone. I stay there and its my home"; When asked how she feels the medication is working, she states that she "doesn't need to be on them"; "I want to go home." Denies feeling depressed, denies feeling anxious; denies suicidal or homicidal ideations; Denies having A/V hallucinations; however speaking tangentially about "outerspace" and "the vaporilife", then starts to mumble. Does not appear to be responding to internal stimuli at this time. When asked about her appetite, she states that it is "fine" and admits to eating her meals. Also states that she "sleeps until the morning".  Principal Problem: Schizoaffective disorder, depressive type ([place]) Diagnosis: Principal Problem:   Schizoaffective disorder, depressive type ([place])  Total Time spent with patient: 30 minutes  Past Psychiatric History: Past history of schizophrenia  Past Medical History:  Past Medical History:  Diagnosis Date   Anemia 10/02/2021   Non compliance w medication regimen    Schizophrenia [facility])     Past Surgical History:  Procedure Laterality Date   BIOPSY  09/25/2021   Procedure: BIOPSY;  Surgeon: Milus Banister, MD;  Location: [facility];  Service: Gastroenterology;;   ESOPHAGOGASTRODUODENOSCOPY (EGD) WITH PROPOFOL [name] 09/25/2021   Procedure: ESOPHAGOGASTRODUODENOSCOPY (EGD) WITH PROPOFOL;  Surgeon: Milus Banister, MD;   Location: [facility];  Service: Gastroenterology;  Laterality: [name];  With NGT placement   Family History: History reviewed. No pertinent family history. Family Psychiatric  History: See previous Social History:  Social History   Substance and Sexual Activity  Alcohol Use Not Currently   Comment: Refuses to disclose how much     Social History   Substance and Sexual Activity  Drug Use No   Comment: Refuses to answer    Social History   Socioeconomic History   Marital status: Single    Spouse name: Not on file   Number of children: Not on file   Years of education: Not on file   Highest education level: Not on file  Occupational History   Not on file  Tobacco Use   Smoking status: Every Day    Packs/day: 2.00    Types: Cigarettes   Smokeless tobacco: Never  Substance and Sexual Activity   Alcohol use: Not Currently    Comment: Refuses to disclose how much   Drug use: No    Comment: Refuses to answer   Sexual activity: Not Currently    Comment: refused to answer  Other Topics Concern   Not on file  Social History Narrative   Not on file   Social Determinants of Health   Financial Resource Strain: Not on file  Food Insecurity: No Food Insecurity (04/08/2022)   Hunger Vital Sign    Worried About Running Out of Food in the Last Year: Never true    Ran Out of Food in the Last Year: Never true  Transportation Needs: No Transportation Needs (04/08/2022)   PRAPARE -  Hydrologist (Medical): No    Lack of Transportation (Non-Medical): No  Physical Activity: Not on file  Stress: Not on file  Social Connections: Not on file   Additional Social History: lives with her mother  Sleep: Fair  Appetite:  Fair  Current Medications: Current Facility-Administered Medications  Medication Dose Route Frequency Provider Last Rate Last Admin   acetaminophen (TYLENOL) tablet 650 mg  650 mg Oral Q6H PRN [name], [name], MD       alum & mag  hydroxide-simeth (MAALOX/MYLANTA) 200-200-20 MG/5ML suspension 30 mL  30 mL Oral Q4H PRN [name], [name], MD       haloperidol (HALDOL) tablet 10 mg  10 mg Oral BID [name], [name], MD   10 mg at 05/13/22 1694   Or   haloperidol lactate (HALDOL) injection 5 mg  5 mg Intramuscular BID [name], [name], MD       lithium carbonate capsule 600 mg  600 mg Oral QHS [name], [name], MD   600 mg at 05/12/22 2105   LORazepam (ATIVAN) tablet 1 mg  1 mg Oral BID [name], [name], MD   1 mg at 05/13/22 5038   Or   LORazepam (ATIVAN) injection 1 mg  1 mg Intramuscular BID [name], [name], MD       magnesium hydroxide (MILK OF MAGNESIA) suspension 30 mL  30 mL Oral Daily PRN [name], [name], MD       mirtazapine (REMERON) tablet 30 mg  30 mg Oral QHS [name], [name], MD   30 mg at 05/12/22 2105   multivitamin with minerals tablet 1 tablet  1 tablet Oral Daily [name], [name], MD   1 tablet at 05/13/22 0815   OLANZapine (ZYPREXA) tablet 30 mg  30 mg Oral QHS [name], [name], MD   30 mg at 05/12/22 2105   thiamine (VITAMIN B1) tablet 100 mg  100 mg Oral Daily [name], [name], MD   100 mg at 05/13/22 0815    Lab Results: No results found for this or any previous visit (from the past 42 hour(s)).  Blood Alcohol level:  Lab Results  Component Value Date   ETH <10 03/29/2022   ETH <10 88/28/0034    Metabolic Disorder Labs: Lab Results  Component Value Date   HGBA1C 5.6 10/21/2021   MPG 114 10/21/2021   MPG 111 10/02/2021   Lab Results  Component Value Date   PROLACTIN 13.6 04/16/2015   Lab Results  Component Value Date   CHOL 189 10/21/2021   TRIG 52 10/21/2021   HDL 50 10/21/2021   CHOLHDL 3.8 10/21/2021   VLDL 10 10/21/2021   LDLCALC 129 (H) 10/21/2021   LDLCALC 93 10/01/2020    Physical Findings: AIMS: Facial and Oral Movements Muscles of Facial Expression: None, normal Lips and Perioral Area: None, normal Jaw: None, normal Tongue: None, normal,Extremity Movements Upper (arms,  wrists, hands, fingers): None, normal Lower (legs, knees, ankles, toes): None, normal, Trunk Movements Neck, shoulders, hips: None, normal, Overall Severity Severity of abnormal movements (highest score from questions above): None, normal Incapacitation due to abnormal movements: None, normal Patient's awareness of abnormal movements (rate only patient's report): No Awareness, Dental Status Current problems with teeth and/or dentures?: No Does patient usually wear dentures?: No  CIWA:    COWS:     Musculoskeletal: Strength & Muscle Tone: within normal limits Gait & Station: normal Patient leans: [name]  Psychiatric Specialty Exam: Physical Exam  Vitals and nursing note reviewed.  Constitutional:      Appearance: Normal appearance.  HENT:     Head: Normocephalic.  Neurological:     General: No focal deficit present.     Mental Status: She is alert. Mental status is at baseline.  Psychiatric:        Attention and Perception: Attention normal.        Mood and Affect: Affect is inappropriate.        Speech: Speech normal.        Behavior: Behavior is not aggressive. Behavior is cooperative.        Thought Content: Thought content is delusional.        Cognition and Memory: Cognition and memory normal.     Review of Systems  Constitutional: Negative.   HENT: Negative.    Eyes: Negative.   Respiratory: Negative.    Cardiovascular: Negative.   Gastrointestinal: Negative.   Musculoskeletal: Negative.   Skin: Negative.   Neurological: Negative.   Psychiatric/Behavioral:  The patient is nervous/anxious.     Blood pressure 116/71, pulse 62, temperature 98.8 F (37.1 C), temperature source Oral, resp. rate 18, height _0  (1.626 m), weight 40.4 kg, SpO2 100 %.Body mass index is 15.28 kg/m.  General Appearance: Bizarre and Casual  Eye Contact:  Minimal  Speech:  WDL  Volume:  Normal  Mood:  Irritable at times  Affect:  Non-Congruent  Thought Process WDL  Orientation:  other;  name, place "hospital"  Thought Content:  Illogical  Suicidal Thoughts:  No  Homicidal Thoughts:  No  Memory:  Immediate;   Fair Recent;   Fair Remote;   Fair  Judgement:  Fair  Insight:  Lacking  Psychomotor Activity:  WDL  Concentration:  WDL  Recall:  [employer] of Knowledge:  Fair  Language:  Good  Akathisia:  NA  Handed:    AIMS (if indicated):     Assets:  Desire for Improvement  ADL's:  intact  Cognition:  Impaired,  Mild  Sleep:  Number of Hours: 8.5      Physical Exam: Physical Exam Vitals and nursing note reviewed.  Constitutional:      Appearance: Normal appearance.  HENT:     Head: Normocephalic.  Neurological:     General: No focal deficit present.     Mental Status: She is alert. Mental status is at baseline.  Psychiatric:        Attention and Perception: Attention normal.        Mood and Affect: Affect is inappropriate.        Speech: Speech normal.        Behavior: Behavior is not aggressive. Behavior is cooperative.        Thought Content: Thought content is delusional.        Cognition and Memory: Cognition and memory normal.    Review of Systems  Constitutional: Negative.   HENT: Negative.    Eyes: Negative.   Respiratory: Negative.    Cardiovascular: Negative.   Gastrointestinal: Negative.   Musculoskeletal: Negative.   Skin: Negative.   Neurological: Negative.   Psychiatric/Behavioral:  The patient is nervous/anxious.    Blood pressure 116/71, pulse 62, temperature 98.8 F (37.1 C), temperature source Oral, resp. rate 18, height _1  (1.626 m), weight 40.4 kg, SpO2 100 %. Body mass index is 15.28 kg/m.   Treatment Plan Summary: Assessment: Schizoaffetive Disorder, depressive type: Haldol 10 mg BID Lithium 600 mg at bedtime Zyprexa 30 mg  daily at bedtime  Insomnia Remeron 30 mg daily at bedtime  Anxiety; Ativan 1 mg BID  Plan: Obtain information from family to see if they are willing to assist with dispo;  Medication  management and Plan increased her lithium dose to 600 mg at night.  We will see if with some time that makes any more difference to her mood irritability and argumentativeness that are preventing [place] from getting her discharge back home or perhaps even improves the psychosis.  Waylan Boga, NP 05/13/2022, 12:35 PM

## 2022-05-13 NOTE — [facility] Counselor (Signed)
CSW spoke with the patient's guardian.  CSW advised that patient was expected for discharge today, 05/13/2022.  Guardian expressed anger at report of discharge, as evidenced by tone, self report and volume of speech.  Guardian stated "so you're sending her home to my mother who doesn't want her".  CSW explained that she has not spoken to patients mother, however, patients plan is to be discharged home.    Guardian asked about letter from psychiatrist to provide to [facility] that expresses a level of care that is needed that will allow the [facility] to assist with placement.  Guardian reports that she has asked nurses to relay this to Dr. Weber Cooks.  CSW explained that Dr. Weber Cooks is out of the office and to this CSW's knowledge no request has been made, this is the first CSW is hearing of this.  CSW expressed that this Probation officer is unsure that such a letter is possible.  CSW offered to assist placement and provide information on group homes.  Guardian yelled at [place] "I don't need a group home!".  She reports that patient needs a permanent locked facility and again requested a letter for placement.  CSW again explained the psychiatrist is out of office.  CSW spoke with Roselyn Reef, NP who reports that discharge is expected to occur by Friday.  CSW advised of the error in dates.  Sister/Guardian remained upset and stated that she would "turn around and IVC her again".  Call terminated without incident.  Assunta Curtis, MSW, LCSW 05/13/2022 4:24 PM

## 2022-05-13 NOTE — Progress Notes (Signed)
Pt denies SI/HI/AVH and verbally agrees to approach staff if these become apparent or before harming themselves/others. Rates depression 0/10. Rates anxiety 0/10. Rates pain 0/10.  Scheduled medications administered to pt, per MD orders. RN provided support and encouragement to pt. Q15 min safety checks implemented and continued. Pt safe on the unit. RN will continue to monitor and intervene as needed.  Problem: Education: Goal: Knowledge of General Education information will improve Description: Including pain rating scale, medication(s)/side effects and non-pharmacologic comfort measures Outcome: Progressing   Problem: Health Behavior/Discharge Planning: Goal: Ability to manage health-related needs will improve Outcome: Progressing   05/13/22 0815  Psych Admission Type (Psych Patients Only)  Admission Status Involuntary  Psychosocial Assessment  Patient Complaints None  Eye Contact Brief  Facial Expression Flat  Affect Flat  Speech Logical/coherent  Interaction Forwards little;Guarded;Minimal  Motor Activity Slow  Appearance/Hygiene Unremarkable;In scrubs  Behavior Characteristics Cooperative;Appropriate to situation;Calm  Mood Pleasant  Thought Process  Coherency WDL  Content WDL  Delusions None reported or observed  Hallucination None reported or observed  Judgment Limited  Confusion None  Danger to Self  Current suicidal ideation? Denies  Danger to Others  Danger to Others None reported or observed

## 2022-05-14 DIAGNOSIS — F251 Schizoaffective disorder, depressive type: Secondary | ICD-10-CM | POA: Diagnosis not present

## 2022-05-14 NOTE — Progress Notes (Signed)
Recreation Therapy Notes   Date: 05/14/2022  Time: 10:40 am   Location: [address] [place]     Behavioral response: [name]   Intervention Topic: Social skills      Discussion/Intervention: Patient refused to attend group.   Clinical Observations/Feedback:  Patient refused to attend group.   Arra Connaughton LRT/CTRS         Zuriah Bordas 05/14/2022 12:44 PM

## 2022-05-14 NOTE — [facility] Counselor (Signed)
CSW was present for contact with guardian, [name] Hodder-Lennon [phone]). She was informed that team would like to have a family meeting tomorrow, 05/15/22 at 10:30AM. Pinkerton-Lennon began to talk about not wanting pt to return to mom's home and become violent with her. She stated that she needs pt's scripts in order to get with [facility] to determine what services would be helpful for pt. Provider informed Deupree-Lennon that discharge will happen either tomorrow or Monday, 05/18/22. She stated that is trying to get pt placed somewhere long-term. Provider mentioned group home and guardian stated that group homes will not work because she can walk away from them. She shared that she plans to visit pt this evening. She was informed that the team would appreciate her feedback regarding pt behavior. She agreed and stated that she would be available for meeting tomorrow. No other concerns expressed. Contact ended without incident.   Chalmers Guest. Guerry Bruin, MSW, LCSW, [place] 05/14/2022 4:15 PM

## 2022-05-14 NOTE — Plan of Care (Signed)
  Problem: Health Behavior/Discharge Planning: Goal: Ability to manage health-related needs will improve Outcome: Progressing   Problem: Clinical Measurements: Goal: Ability to maintain clinical measurements within normal limits will improve Outcome: Progressing Goal: Will remain free from infection Outcome: Progressing Goal: Diagnostic test results will improve Outcome: Progressing Goal: Respiratory complications will improve Outcome: Progressing Goal: Cardiovascular complication will be avoided Outcome: Progressing   Problem: Safety: Goal: Periods of time without injury will increase Outcome: Progressing   Problem: Physical Regulation: Goal: Ability to maintain clinical measurements within normal limits will improve Outcome: Progressing   Problem: Health Behavior/Discharge Planning: Goal: Identification of resources available to assist in meeting health care needs will improve Outcome: Progressing Goal: Compliance with treatment plan for underlying cause of condition will improve Outcome: Progressing   Problem: Coping: Goal: Ability to verbalize frustrations and anger appropriately will improve Outcome: Progressing Goal: Ability to demonstrate self-control will improve Outcome: Progressing   

## 2022-05-14 NOTE — Progress Notes (Addendum)
Pleasant and cooperative.  She remains isolative to her room.  She is med compliant and received her meds without incident.  Major complaint is the desire to be discharged.  Other than this patient has no concerns or issues.  Support and encouragement offered.  Will continue to monitor with q15 minute safety checks.     C Butler-Nicholson, LPN

## 2022-05-14 NOTE — Progress Notes (Signed)
[facility] MD Progress Note  05/14/2022 6:50 AM [name]  MRN:  [id number]  Subjective: Patient seen for follow-up; "I just want to go home" This is patient's daily assessment; pt remains at baseline; pt expressing desire to go home, as she "did yesterday, nobody listens"; pt is awake, alert, met me at the door of her room; remains with limited to no eye contact when speaking and walks around her room during the interview; pt states that she is eating and sleeping "just fine"; when asked if she goes to group or gets out into the day room, she states she "doesn't get anything out of it"; pt stating that she "doesn't want to talk anymore" to me and requests that I leave the room. Pt family called earlier this afternoon to discuss discharge plan with providers. Denies suicidal/homicidal ideations, does not appear to be responding to internal stimuli at the time of assessment and doesn't answer to the question of if she is hearing or seeing things that that she suspects others do not.  Appears to be stable on current medication regimen.  Principal Problem: Schizoaffective disorder, depressive type ([place]) Diagnosis: Principal Problem:   Schizoaffective disorder, depressive type ([place])  Total Time spent with patient: 30 minutes  Past Psychiatric History: Past history of schizophrenia  Past Medical History:  Past Medical History:  Diagnosis Date   Anemia 10/02/2021   Non compliance w medication regimen    Schizophrenia [facility])     Past Surgical History:  Procedure Laterality Date   BIOPSY  09/25/2021   Procedure: BIOPSY;  Surgeon: Milus Banister, MD;  Location: [facility];  Service: Gastroenterology;;   ESOPHAGOGASTRODUODENOSCOPY (EGD) WITH PROPOFOL [name] 09/25/2021   Procedure: ESOPHAGOGASTRODUODENOSCOPY (EGD) WITH PROPOFOL;  Surgeon: Milus Banister, MD;  Location: [facility];  Service: Gastroenterology;  Laterality: [name];  With NGT placement   Family History: History reviewed. No pertinent family  history. Family Psychiatric  History: See previous Social History:  Social History   Substance and Sexual Activity  Alcohol Use Not Currently   Comment: Refuses to disclose how much     Social History   Substance and Sexual Activity  Drug Use No   Comment: Refuses to answer    Social History   Socioeconomic History   Marital status: Single    Spouse name: Not on file   Number of children: Not on file   Years of education: Not on file   Highest education level: Not on file  Occupational History   Not on file  Tobacco Use   Smoking status: Every Day    Packs/day: 2.00    Types: Cigarettes   Smokeless tobacco: Never  Substance and Sexual Activity   Alcohol use: Not Currently    Comment: Refuses to disclose how much   Drug use: No    Comment: Refuses to answer   Sexual activity: Not Currently    Comment: refused to answer  Other Topics Concern   Not on file  Social History Narrative   Not on file   Social Determinants of Health   Financial Resource Strain: Not on file  Food Insecurity: No Food Insecurity (04/08/2022)   Hunger Vital Sign    Worried About Running Out of Food in the Last Year: Never true    Ran Out of Food in the Last Year: Never true  Transportation Needs: No Transportation Needs (04/08/2022)   PRAPARE - Hydrologist (Medical): No    Lack of Transportation (Non-Medical):  No  Physical Activity: Not on file  Stress: Not on file  Social Connections: Not on file   Additional Social History: lives with her mother  Sleep: Fair  Appetite:  Fair  Current Medications: Current Facility-Administered Medications  Medication Dose Route Frequency Provider Last Rate Last Admin   acetaminophen (TYLENOL) tablet 650 mg  650 mg Oral Q6H PRN [name], [name], MD       alum & mag hydroxide-simeth (MAALOX/MYLANTA) 200-200-20 MG/5ML suspension 30 mL  30 mL Oral Q4H PRN [name], [name], MD       haloperidol (HALDOL) tablet 10 mg  10 mg  Oral BID [name], [name], MD   10 mg at 05/13/22 1717   Or   haloperidol lactate (HALDOL) injection 5 mg  5 mg Intramuscular BID [name], [name], MD       lithium carbonate capsule 600 mg  600 mg Oral QHS [name], [name], MD   600 mg at 05/13/22 2133   LORazepam (ATIVAN) tablet 1 mg  1 mg Oral BID [name], [name], MD   1 mg at 05/13/22 1717   Or   LORazepam (ATIVAN) injection 1 mg  1 mg Intramuscular BID [name], [name], MD       magnesium hydroxide (MILK OF MAGNESIA) suspension 30 mL  30 mL Oral Daily PRN [name], [name], MD       mirtazapine (REMERON) tablet 30 mg  30 mg Oral QHS [name], [name], MD   30 mg at 05/13/22 2133   multivitamin with minerals tablet 1 tablet  1 tablet Oral Daily [name], [name], MD   1 tablet at 05/13/22 0815   OLANZapine (ZYPREXA) tablet 30 mg  30 mg Oral QHS [name], [name], MD   30 mg at 05/13/22 2133   thiamine (VITAMIN B1) tablet 100 mg  100 mg Oral Daily [name], [name], MD   100 mg at 05/13/22 0815    Lab Results: No results found for this or any previous visit (from the past 58 hour(s)).  Blood Alcohol level:  Lab Results  Component Value Date   ETH <10 03/29/2022   ETH <10 07/22/7251    Metabolic Disorder Labs: Lab Results  Component Value Date   HGBA1C 5.6 10/21/2021   MPG 114 10/21/2021   MPG 111 10/02/2021   Lab Results  Component Value Date   PROLACTIN 13.6 04/16/2015   Lab Results  Component Value Date   CHOL 189 10/21/2021   TRIG 52 10/21/2021   HDL 50 10/21/2021   CHOLHDL 3.8 10/21/2021   VLDL 10 10/21/2021   LDLCALC 129 (H) 10/21/2021   LDLCALC 93 10/01/2020    Physical Findings: AIMS: Facial and Oral Movements Muscles of Facial Expression: None, normal Lips and Perioral Area: None, normal Jaw: None, normal Tongue: None, normal,Extremity Movements Upper (arms, wrists, hands, fingers): None, normal Lower (legs, knees, ankles, toes): None, normal, Trunk Movements Neck, shoulders, hips: None, normal, Overall  Severity Severity of abnormal movements (highest score from questions above): None, normal Incapacitation due to abnormal movements: None, normal Patient's awareness of abnormal movements (rate only patient's report): No Awareness, Dental Status Current problems with teeth and/or dentures?: No Does patient usually wear dentures?: No  CIWA:    COWS:     Musculoskeletal: Strength & Muscle Tone: within normal limits Gait & Station: normal Patient leans: [name]  Psychiatric Specialty Exam: General Appearance: Casual  Eye Contact:  Minimal  Speech:  WDL  Volume:  Normal  Mood:  Irritable at times  Affect:  Non-Congruent  Thought Process WDL  Orientation:  other; name, place "hospital"  Thought Content:  logical at times  Suicidal Thoughts:  No  Homicidal Thoughts:  No  Memory:  Immediate;   Fair Recent;   Fair Remote;   Fair  Judgement:  Fair  Insight:  Lacking  Psychomotor Activity:  WDL  Concentration:  WDL  Recall:  [employer] of Knowledge:  Fair  Language:  Good  Akathisia:  NA  Handed:    AIMS (if indicated):     Assets:  Desire for Improvement  ADL's:  intact  Cognition:  Impaired,  Mild  Sleep:  Number of Hours: 8.5       Review of Systems  Constitutional: Negative.   HENT: Negative.    Eyes: Negative.   Respiratory: Negative.    Cardiovascular: Negative.   Gastrointestinal: Negative.   Musculoskeletal: Negative.   Skin: Negative.   Allergic/Immunologic: Negative.   Neurological: Negative.   Hematological: Negative.   Psychiatric/Behavioral:  The patient is nervous/anxious.     Blood pressure 104/62, pulse (!) 48, temperature 98.4 F (36.9 C), temperature source Oral, resp. rate 18, height _0  (1.626 m), weight 40.4 kg, SpO2 100 %.Body mass index is 15.28 kg/m.     Physical Exam: Physical Exam Vitals and nursing note reviewed.  Constitutional:      Appearance: Normal appearance.  HENT:     Head: Normocephalic.  Pulmonary:     Effort: Pulmonary  effort is normal.  Musculoskeletal:        General: Normal range of motion.     Cervical back: Normal range of motion.  Neurological:     General: No focal deficit present.     Mental Status: She is alert. Mental status is at baseline.  Psychiatric:        Attention and Perception: Attention normal.        Mood and Affect: Affect is inappropriate.        Speech: Speech normal.        Behavior: Behavior is not aggressive. Behavior is cooperative.        Thought Content: Thought content is delusional.        Cognition and Memory: Cognition and memory normal.    Review of Systems  Constitutional: Negative.   HENT: Negative.    Eyes: Negative.   Respiratory: Negative.    Cardiovascular: Negative.   Gastrointestinal: Negative.   Musculoskeletal: Negative.   Skin: Negative.   Neurological: Negative.   Psychiatric/Behavioral:  The patient is nervous/anxious.    Blood pressure 104/62, pulse (!) 48, temperature 98.4 F (36.9 C), temperature source Oral, resp. rate 18, height _1  (1.626 m), weight 40.4 kg, SpO2 100 %. Body mass index is 15.28 kg/m.   Treatment Plan Summary: Assessment: Schizoaffetive Disorder, depressive type: Haldol 10 mg BID Lithium 600 mg at bedtime Zyprexa 30 mg daily at bedtime  Insomnia Remeron 30 mg daily at bedtime  Anxiety; Ativan 1 mg BID  Plan: Will discuss with family to see if they are willing to assist with dispo   Waylan Boga, NP 05/14/2022, 6:50 AM

## 2022-05-14 NOTE — Group Note (Signed)
[facility] LCSW Group Therapy Note   Group Date: 05/14/2022 Start Time: 1300 End Time: 1400   Type of Therapy and Topic: Group Therapy: Avoiding Self-Sabotaging and Enabling Behaviors  Participation Level: Did Not Attend  Mood:  Description of Group:  In this group, patients will learn how to identify obstacles, self-sabotaging and enabling behaviors, as well as: what are they, why do we do them and what needs these behaviors meet. Discuss unhealthy relationships and how to have positive healthy boundaries with those that sabotage and enable. Explore aspects of self-sabotage and enabling in yourself and how to limit these self-destructive behaviors in everyday life.   Therapeutic Goals: 1. Patient will identify one obstacle that relates to self-sabotage and enabling behaviors 2. Patient will identify one personal self-sabotaging or enabling behavior they did prior to admission 3. Patient will state a plan to change the above identified behavior 4. Patient will demonstrate ability to communicate their needs through discussion and/or role play.    Summary of Patient Progress:   Patient did not attend group despite encouraged participation.    Therapeutic Modalities:  Cognitive Behavioral Therapy Person-Centered Therapy Motivational Interviewing    Soni Kegel W Clover Feehan, LCSWA 

## 2022-05-14 NOTE — Plan of Care (Signed)
D: Patient alert and oriented. Patient denies pain. Patient denies anxiety and depression. Patient denies SI/HI/AVH. Patient remains isolative to room during shift with exception to coming out for meals and medication.   A: Scheduled medications administered to patient, per MD orders.  Support and encouragement provided to patient.  Q15 minute safety checks maintained.   R: Patient compliant with medication administration and treatment plan. No adverse drug reactions noted. Patient remains safe on the unit at this time.  Problem: Education: Goal: Knowledge of [facility] Education information/materials will improve Outcome: Progressing Goal: Verbalization of understanding the information provided will improve Outcome: Progressing   Problem: Health Behavior/Discharge Planning: Goal: Compliance with treatment plan for underlying cause of condition will improve Outcome: Progressing   Problem: Physical Regulation: Goal: Ability to maintain clinical measurements within normal limits will improve Outcome: Progressing   Problem: Safety: Goal: Periods of time without injury will increase Outcome: Progressing

## 2022-05-15 DIAGNOSIS — F251 Schizoaffective disorder, depressive type: Secondary | ICD-10-CM | POA: Diagnosis not present

## 2022-05-15 NOTE — Progress Notes (Signed)
Patient is quiet and reserved.  She was pleasant and cooperative until her sister came for a visit this evening. Patient became upset.  Stated "that is not  my sister" then walked off and went back to her room.  Patient stayed in her room until snack time. She then took her medication without incident.  She denies si hi avh depression and anxiety. Will continue to monitor with q15 minute safety checks.    C Butler-Nicholson, LPN

## 2022-05-15 NOTE — Progress Notes (Signed)
Pt denies SI/HI/AVH and verbally agrees to approach staff if these become apparent or before harming themselves/others. Rates depression 0/10. Rates anxiety 0/10. Rates pain 0/10.  Scheduled medications administered to pt, per MD orders. RN provided support and encouragement to pt. Q15 min safety checks implemented and continued. Pt safe on the unit. RN will continue to monitor and intervene as needed.  Problem: Coping: Goal: Level of anxiety will decrease Outcome: Progressing   Problem: Activity: Goal: Will verbalize the importance of balancing activity with adequate rest periods Outcome: Progressing   05/15/22 0757  Psych Admission Type (Psych Patients Only)  Admission Status Involuntary  Psychosocial Assessment  Patient Complaints None  Eye Contact Fair  Facial Expression Flat  Affect Flat  Speech Logical/coherent  Interaction Assertive;Guarded  Motor Activity Slow  Appearance/Hygiene Unremarkable;In scrubs  Behavior Characteristics Cooperative;Appropriate to situation;Calm  Mood Pleasant  Thought Process  Coherency WDL  Content Blaming others  Delusions None reported or observed  Perception WDL  Hallucination None reported or observed  Judgment Limited  Confusion None  Danger to Self  Current suicidal ideation? Denies  Danger to Others  Danger to Others None reported or observed

## 2022-05-15 NOTE — Progress Notes (Signed)
Recreation Therapy Notes   Date: 05/15/2022  Time: 10:40 am   Location: Craft room    Behavioral response: [name]   Intervention Topic: Happiness     Discussion/Intervention: Patient refused to attend group.   Clinical Observations/Feedback:  Patient refused to attend group.   Basia Mcginty LRT/CTRS         Augustina Braddock 05/15/2022 1:06 PM

## 2022-05-15 NOTE — BH IP Treatment Plan (Addendum)
Interdisciplinary Treatment and Diagnostic Plan Update  05/15/2022 Time of Session: 08:30 Ciarra Braddy MRN: [id number]  Principal Diagnosis: Schizoaffective disorder, depressive type [facility])  Secondary Diagnoses: Principal Problem:   Schizoaffective disorder, depressive type ([place])   Current Medications:  Current Facility-Administered Medications  Medication Dose Route Frequency Provider Last Rate Last Admin   acetaminophen (TYLENOL) tablet 650 mg  650 mg Oral Q6H PRN [name], [name], MD       alum & mag hydroxide-simeth (MAALOX/MYLANTA) 200-200-20 MG/5ML suspension 30 mL  30 mL Oral Q4H PRN [name], [name], MD       haloperidol (HALDOL) tablet 10 mg  10 mg Oral BID [name], [name], MD   10 mg at 05/15/22 0757   Or   haloperidol lactate (HALDOL) injection 5 mg  5 mg Intramuscular BID [name], [name], MD       lithium carbonate capsule 600 mg  600 mg Oral QHS [name], [name], MD   600 mg at 05/14/22 2122   LORazepam (ATIVAN) tablet 1 mg  1 mg Oral BID [name], [name], MD   1 mg at 05/15/22 5809   Or   LORazepam (ATIVAN) injection 1 mg  1 mg Intramuscular BID [name], [name], MD       magnesium hydroxide (MILK OF MAGNESIA) suspension 30 mL  30 mL Oral Daily PRN [name], [name], MD       mirtazapine (REMERON) tablet 30 mg  30 mg Oral QHS [name], [name], MD   30 mg at 05/14/22 2120   multivitamin with minerals tablet 1 tablet  1 tablet Oral Daily [name], [name], MD   1 tablet at 05/15/22 0758   OLANZapine (ZYPREXA) tablet 30 mg  30 mg Oral QHS [name], [name], MD   30 mg at 05/14/22 2121   thiamine (VITAMIN B1) tablet 100 mg  100 mg Oral Daily [name], [name], MD   100 mg at 05/15/22 9833   PTA Medications: Medications Prior to Admission  Medication Sig Dispense Refill Last Dose   clonazePAM (KLONOPIN) 0.5 MG tablet Take 0.5 tablets (0.25 mg total) by mouth 2 (two) times daily. (Patient not taking: Reported on 03/30/2022) 60 tablet 0    haloperidol (HALDOL) 5 MG tablet Take 1 tablet (5 mg  total) by mouth 2 (two) times daily. (Patient not taking: Reported on 03/30/2022) 60 tablet 3    midodrine (PROAMATINE) 2.5 MG tablet Take 1 tablet (2.5 mg total) by mouth 3 (three) times daily with meals. (Patient not taking: Reported on 03/30/2022)  0    mirtazapine (REMERON) 15 MG tablet Take 0.5 tablets (7.5 mg total) by mouth at bedtime. (Patient not taking: Reported on 03/30/2022) 30 tablet 2    Multiple Vitamin (MULTIVITAMIN WITH MINERALS) TABS tablet Take 1 tablet by mouth daily. (Patient not taking: Reported on 03/30/2022)      OLANZapine (ZYPREXA) 5 MG tablet Take 1 tablet (5 mg total) by mouth at bedtime. (Patient not taking: Reported on 03/30/2022) 30 tablet 2    thiamine 100 MG tablet Take 1 tablet (100 mg total) by mouth daily. (Patient not taking: Reported on 03/30/2022)       Patient Stressors: Marital or family conflict    Patient Strengths: Supportive family/friends   Treatment Modalities: Medication Management, Group therapy, Case management,  1 to 1 session with clinician, Psychoeducation, Recreational therapy.   Physician Treatment Plan for Primary Diagnosis: Schizoaffective disorder, depressive type ([place]) Long Term Goal(s): Improvement in symptoms so as ready for  discharge   Short Term Goals: Compliance with prescribed medications will improve Ability to verbalize feelings will improve Ability to demonstrate self-control will improve Ability to identify and develop effective coping behaviors will improve  Medication Management: Evaluate patient's response, side effects, and tolerance of medication regimen.  Therapeutic Interventions: 1 to 1 sessions, Unit Group sessions and Medication administration.  Evaluation of Outcomes: Progressing  Physician Treatment Plan for Secondary Diagnosis: Principal Problem:   Schizoaffective disorder, depressive type (HCC)  Long Term Goal(s): Improvement in symptoms so as ready for discharge   Short Term Goals: Compliance with  prescribed medications will improve Ability to verbalize feelings will improve Ability to demonstrate self-control will improve Ability to identify and develop effective coping behaviors will improve     Medication Management: Evaluate patient's response, side effects, and tolerance of medication regimen.  Therapeutic Interventions: 1 to 1 sessions, Unit Group sessions and Medication administration.  Evaluation of Outcomes: Progressing   RN Treatment Plan for Primary Diagnosis: Schizoaffective disorder, depressive type (HCC) Long Term Goal(s): Knowledge of disease and therapeutic regimen to maintain health will improve  Short Term Goals: Ability to remain free from injury will improve, Ability to verbalize frustration and anger appropriately will improve, Ability to demonstrate self-control, Ability to participate in decision making will improve, Ability to verbalize feelings will improve, Ability to disclose and discuss suicidal ideas, Ability to identify and develop effective coping behaviors will improve, and Compliance with prescribed medications will improve  Medication Management: RN will administer medications as ordered by provider, will assess and evaluate patient's response and provide education to patient for prescribed medication. RN will report any adverse and/or side effects to prescribing provider.  Therapeutic Interventions: 1 on 1 counseling sessions, Psychoeducation, Medication administration, Evaluate responses to treatment, Monitor vital signs and CBGs as ordered, Perform/monitor CIWA, COWS, AIMS and Fall Risk screenings as ordered, Perform wound care treatments as ordered.  Evaluation of Outcomes: Progressing   LCSW Treatment Plan for Primary Diagnosis: Schizoaffective disorder, depressive type (HCC) Long Term Goal(s): Safe transition to appropriate next level of care at discharge, Engage patient in therapeutic group addressing interpersonal concerns.  Short Term  Goals: Engage patient in aftercare planning with referrals and resources, Increase social support, Increase ability to appropriately verbalize feelings, Increase emotional regulation, Facilitate acceptance of mental health diagnosis and concerns, and Increase skills for wellness and recovery  Therapeutic Interventions: Assess for all discharge needs, 1 to 1 time with Social worker, Explore available resources and support systems, Assess for adequacy in community support network, Educate family and significant other(s) on suicide prevention, Complete Psychosocial Assessment, Interpersonal group therapy.  Evaluation of Outcomes: Progressing   Progress in Treatment: Attending groups: Yes. Participating in groups: No. Taking medication as prescribed: Yes. Toleration medication: Yes. Family/Significant other contact made: Yes, individual(s) contacted:  guardian/sister, [name] Shankar-Lennon. Patient understands diagnosis: Yes. Discussing patient identified problems/goals with staff: Yes. Medical problems stabilized or resolved: Yes. Denies suicidal/homicidal ideation: Yes. Issues/concerns per patient self-inventory: No. Other: none.  New problem(s) identified: No, Describe:  none identified. Update 04/15/22: No changes at this time. Update 04/20/22: No changes at this time. Update 04/25/22: No changes at this time. Update 05/01/22: No changes at this time. 05/05/22 Update: No changes at this time. 05/10/22 Update: No changes at this time. 05/15/22 Update: No changes at this time.   New Short Term/Long Term Goal(s): elimination of symptoms of psychosis, medication management for mood stabilization; elimination of SI thoughts; development of comprehensive mental wellness plan. Update 04/15/22: No changes  at this time. Update 04/20/22: No changes at this time. Update 04/25/22: No changes at this time. Update 05/01/22: No changes at this time. 05/05/22 Update: No changes at this time. 05/10/22 Update: No  changes at this time. 05/15/22 Update: No changes at this time.   Patient Goals:  "Nothing I want to work on. I want to be discharged." Update 04/15/22: No changes at this time. Update 04/20/22: No changes at this time. Update 04/25/22: No changes at this time. Update 05/01/22: No changes at this time. 05/05/22 Update: No changes at this time. 05/10/22 Update: No changes at this time. 05/15/22 Update: No changes at this time.   Discharge Plan or Barriers: CSW will assist pt with development of an appropriate aftercare/discharge plan. Update 04/15/22: Pt has been unwilling to engage with the treatment team around discharge. Update 04/20/22: No changes at this time.   Update 04/25/22: Patient will be returning to live with her sister. Pt will receive outpatient services through [facility]. Update 05/01/22: No changes at this time. 05/05/22 Update: No changes at this time. 05/10/22 Update: will return to her ACT team. 05/15/22 Update: No changes at this time.   Reason for Continuation of Hospitalization: Aggression Medication stabilization   Estimated Length of Stay: 1-7 days Update 04/15/22: No changes at this time. Update 04/20/22: TBD Update 04/25/22: TBD Update 05/01/22: No changes at this time. 05/05/22 Update: No changes at this time. 05/10/22 Update: No changes at this time. 05/15/22 Update: No changes at this time.  Last 3 [place] Suicide Severity Risk Score: Flowsheet Row Admission (Current) from 04/08/2022 in [facility] INPATIENT BEHAVIORAL MEDICINE Admission (Discharged) from 10/17/2021 in [facility]IC[facility] Admission (Discharged) from 10/02/2021 in [facility] REGIONAL CARDIAC MED PCU  C-SSRS RISK CATEGORY No Risk No Risk No Risk       Last PHQ 2/9 Scores:     No data to display          Scribe for Treatment Team: Glenis Smoker, LCSW 05/15/2022 8:59 AM

## 2022-05-15 NOTE — BHH Counselor (Addendum)
Provider and CSW contacted guardian/sister, [name] [phone]) for family meeting. [name] updated both that she had come to see pt yesterday but pt did not want to see her. She shared that pt told her that she will not be transported by the guardian at discharge. Guardian also stated that pt is planning to come home and kick her mother out of the home, which is the mother's home. CSW explained that placement is not an reason to keep someone in the hospital. [name] stated that pt's delusions are typically around her family but that these had stopped but began again after ECT. CSW inquired regarding pt's ACT team. Guardian stated that she was uncertain of the name for them but that they have stated that they cannot handle the pt. [name] shared that she has been trying to get in touch with someone to get pt's medications so she can talk to the [place] and see about them getting pt into placement. She was informed that team would go speak with pt and if she did not mention trying to kick her mother out then she would be discharged today otherwise it would be Monday. No other concerns expressed. Contact ended without incident.   Provider and CSW attempted to meet with pt to discuss discharge. She stated plans to return home. However, when asked about further plans pt began to state that team did not need to be all in her business. She stated, "Y'all pry and pry and pry." Conversation ended at this point and pt slammed door behind team.   Guardian was called back and updated that discharge would not happen today. [facility] agreed [facility] could [facility] in any way. [name] shared that pt information mostly in [place], however, she had been in contact with the [place] location. No other concerns expressed. Contact ended without incident.   CSW received another call from guardian asking to be updated regarding contact with [facility] facility, who he speaks with and  details. CSW agreed. No other concerns expressed. Contact ended without incident.   [name]. Guerry Bruin, MSW, LCSW, [place] 05/15/2022 3:37 PM  ADDENDUM  CSW contacted [facility] [phone]) and spoke with Casia. CSW was informed that in order for any services to begin, pt would need to be connected with primary care through the [place]. No other concerns expressed. Contact ended without incident.   CSW called guardian back to update regarding contact. [name] was updated and stated that she was trying to explain to team that she needed paperwork with pt's diagnosis and behaviors on it to take to the [place] to get her connected with primary care. CSW agreed and informed her that the initial psych assessment will be emailed to her at [email or web address]. No other concerns expressed. Contact ended without incident.   CSW emailed copy of H&P to guardian.   [name]. Guerry Bruin, MSW, [place], [place] 05/15/2022 4:35 PM

## 2022-05-15 NOTE — Progress Notes (Signed)
[facility] MD Progress Note  05/15/2022 1:43 PM [name]  MRN:  [id number]  Subjective: Patient seen for follow-up after speaking her guardian, her sister.  She reports the client would not talk with her yesterday during her visit except to say she would be going home and removing her mother from the home, despite the home being her mother's home.  This provider and social work met with the client who continues to be delusional and refusing to talk abut discharge, accusing the team of prying into her business.  She secludes to her room, compliant with medications and eating.  Her sister is agreeable for information to be sent to the [place] since she is a English as a second language teacher to get support for placement for [place], social work working on this.  Principal Problem: Schizoaffective disorder, depressive type ([place]) Diagnosis: Principal Problem:   Schizoaffective disorder, depressive type ([place])  Total Time spent with patient: 30 minutes  Past Psychiatric History: Past history of schizophrenia  Past Medical History:  Past Medical History:  Diagnosis Date   Anemia 10/02/2021   Non compliance w medication regimen    Schizophrenia [facility])     Past Surgical History:  Procedure Laterality Date   BIOPSY  09/25/2021   Procedure: BIOPSY;  Surgeon: Milus Banister, MD;  Location: [facility];  Service: Gastroenterology;;   ESOPHAGOGASTRODUODENOSCOPY (EGD) WITH PROPOFOL [name] 09/25/2021   Procedure: ESOPHAGOGASTRODUODENOSCOPY (EGD) WITH PROPOFOL;  Surgeon: Milus Banister, MD;  Location: [facility];  Service: Gastroenterology;  Laterality: [name];  With N[facility] placement   Family History: History reviewed. No pertinent family history. Family Psychiatric  History: See previous Social History:  Social History   Substance and Sexual Activity  Alcohol Use Not Currently   Comment: Refuses to disclose how much     Social History   Substance and Sexual Activity  Drug Use No   Comment: Refuses to answer    Social History    Socioeconomic History   Marital status: Single    Spouse name: Not on file   Number of children: Not on file   Years of education: Not on file   Highest education level: Not on file  Occupational History   Not on file  Tobacco Use   Smoking status: Every Day    Packs/day: 2.00    Types: Cigarettes   Smokeless tobacco: Never  Substance and Sexual Activity   Alcohol use: Not Currently    Comment: Refuses to disclose how much   Drug use: No    Comment: Refuses to answer   Sexual activity: Not Currently    Comment: refused to answer  Other Topics Concern   Not on file  Social History Narrative   Not on file   Social Determinants of Health   Financial Resource Strain: Not on file  Food Insecurity: No Food Insecurity (04/08/2022)   Hunger Vital Sign    Worried About Running Out of Food in the Last Year: Never true    Ran Out of Food in the Last Year: Never true  Transportation Needs: No Transportation Needs (04/08/2022)   PRAPARE - Hydrologist (Medical): No    Lack of Transportation (Non-Medical): No  Physical Activity: Not on file  Stress: Not on file  Social Connections: Not on file   Additional Social History: lives with her mother  Sleep: Fair  Appetite:  Fair  Current Medications: Current Facility-Administered Medications  Medication Dose Route Frequency Provider Last Rate Last Admin   acetaminophen (TYLENOL)  tablet 650 mg  650 mg Oral Q6H PRN [name], [name], MD       alum & mag hydroxide-simeth (MAALOX/MYLANTA) 200-200-20 MG/5ML suspension 30 mL  30 mL Oral Q4H PRN [name], [name], MD       haloperidol (HALDOL) tablet 10 mg  10 mg Oral BID [name], [name], MD   10 mg at 05/15/22 0757   Or   haloperidol lactate (HALDOL) injection 5 mg  5 mg Intramuscular BID [name], [name], MD       lithium carbonate capsule 600 mg  600 mg Oral QHS [name], [name], MD   600 mg at 05/14/22 2122   LORazepam (ATIVAN) tablet 1 mg  1 mg Oral BID  [name], [name], MD   1 mg at 05/15/22 0960   Or   LORazepam (ATIVAN) injection 1 mg  1 mg Intramuscular BID [name], [name], MD       magnesium hydroxide (MILK OF MAGNESIA) suspension 30 mL  30 mL Oral Daily PRN [name], [name], MD       mirtazapine (REMERON) tablet 30 mg  30 mg Oral QHS [name], [name], MD   30 mg at 05/14/22 2120   multivitamin with minerals tablet 1 tablet  1 tablet Oral Daily [name], [name], MD   1 tablet at 05/15/22 0758   OLANZapine (ZYPREXA) tablet 30 mg  30 mg Oral QHS [name], [name], MD   30 mg at 05/14/22 2121   thiamine (VITAMIN B1) tablet 100 mg  100 mg Oral Daily [name], [name], MD   100 mg at 05/15/22 4540    Lab Results: No results found for this or any previous visit (from the past 48 hour(s)).  Blood Alcohol level:  Lab Results  Component Value Date   ETH <10 03/29/2022   ETH <10 98/05/9146    Metabolic Disorder Labs: Lab Results  Component Value Date   HGBA1C 5.6 10/21/2021   MPG 114 10/21/2021   MPG 111 10/02/2021   Lab Results  Component Value Date   PROLACTIN 13.6 04/16/2015   Lab Results  Component Value Date   CHOL 189 10/21/2021   TRIG 52 10/21/2021   HDL 50 10/21/2021   CHOLHDL 3.8 10/21/2021   VLDL 10 10/21/2021   LDLCALC 129 (H) 10/21/2021   LDLCALC 93 10/01/2020    Physical Findings: AIMS: Facial and Oral Movements Muscles of Facial Expression: None, normal Lips and Perioral Area: None, normal Jaw: None, normal Tongue: None, normal,Extremity Movements Upper (arms, wrists, hands, fingers): None, normal Lower (legs, knees, ankles, toes): None, normal, Trunk Movements Neck, shoulders, hips: None, normal, Overall Severity Severity of abnormal movements (highest score from questions above): None, normal Incapacitation due to abnormal movements: None, normal Patient's awareness of abnormal movements (rate only patient's report): No Awareness, Dental Status Current problems with teeth and/or dentures?: No Does patient  usually wear dentures?: No  CIWA:    COWS:     Musculoskeletal: Strength & Muscle Tone: within normal limits Gait & Station: normal Patient leans: [name]  Psychiatric Specialty Exam: General Appearance: Casual  Eye Contact:  Minimal  Speech:  WDL  Volume:  Normal  Mood:  Irritable at times  Affect:  Non-Congruent  Thought Process delusional  Orientation:  other; name, place "hospital"  Thought Content:  logical at times  Suicidal Thoughts:  No  Homicidal Thoughts:  No  Memory:  Immediate;   Fair Recent;   Fair Remote;   Fair  Judgement:  Fair  Insight:  Lacking  Psychomotor Activity:  WDL  Concentration:  WDL  Recall:  [employer] of Knowledge:  Fair  Language:  Good  Akathisia:  NA  Handed:    AIMS (if indicated):     Assets:  Desire for Improvement  ADL's:  intact  Cognition:  Impaired,  Mild  Sleep:  Number of Hours: 8.5       Review of Systems  Constitutional: Negative.   HENT: Negative.    Eyes: Negative.   Respiratory: Negative.    Cardiovascular: Negative.   Gastrointestinal: Negative.   Musculoskeletal: Negative.   Skin: Negative.   Allergic/Immunologic: Negative.   Neurological: Negative.   Hematological: Negative.   Psychiatric/Behavioral:  The patient is nervous/anxious.     Blood pressure 103/64, pulse (!) 55, temperature 98.4 F (36.9 C), temperature source Oral, resp. rate 18, height _0  (1.626 m), weight 40.4 kg, SpO2 99 %.Body mass index is 15.28 kg/m.     Physical Exam: Physical Exam Vitals and nursing note reviewed.  Constitutional:      Appearance: Normal appearance.  HENT:     Head: Normocephalic.  Pulmonary:     Effort: Pulmonary effort is normal.  Musculoskeletal:        General: Normal range of motion.     Cervical back: Normal range of motion.  Neurological:     General: No focal deficit present.     Mental Status: She is alert. Mental status is at baseline.  Psychiatric:        Attention and Perception: Attention  normal.        Mood and Affect: Affect is inappropriate.        Speech: Speech normal.        Behavior: Behavior is not aggressive. Behavior is cooperative.        Thought Content: Thought content is delusional.        Cognition and Memory: Cognition and memory normal.    Review of Systems  Constitutional: Negative.   HENT: Negative.    Eyes: Negative.   Respiratory: Negative.    Cardiovascular: Negative.   Gastrointestinal: Negative.   Musculoskeletal: Negative.   Skin: Negative.   Neurological: Negative.   Psychiatric/Behavioral:  The patient is nervous/anxious.    Blood pressure 103/64, pulse (!) 55, temperature 98.4 F (36.9 C), temperature source Oral, resp. rate 18, height _1  (1.626 m), weight 40.4 kg, SpO2 99 %. Body mass index is 15.28 kg/m.   Treatment Plan Summary: Assessment: Schizoaffetive Disorder, depressive type: Haldol 10 mg BID Lithium 600 mg at bedtime Zyprexa 30 mg daily at bedtime  Insomnia Remeron 30 mg daily at bedtime  Anxiety; Ativan 1 mg BID  Plan: Transfer to [place], NP 05/15/2022, 1:43 PM

## 2022-05-16 DIAGNOSIS — F251 Schizoaffective disorder, depressive type: Secondary | ICD-10-CM | POA: Diagnosis not present

## 2022-05-16 NOTE — Plan of Care (Signed)
Pt denies anxiety/depression at this time. Pt denies SI/HI/AVH or pain at this time. Pt is calm and cooperative. Pt is medication compliant. Pt provided with support and encouragement. Pt monitored q15 minutes for safety per unit policy. Plan of care ongoing.    Problem: Nutrition: Goal: Adequate nutrition will be maintained Outcome: Progressing   Problem: Coping: Goal: Will verbalize feelings Outcome: Not Progressing

## 2022-05-16 NOTE — Progress Notes (Signed)
Uneventful night, slept well. Denies Si, HI, AVH. No complaints or concerns voiced. Encouragement and support provided. Safety checks maintained. Medications given as prescribed. Pt receptive and remains safe on unit with q 15 min checks.

## 2022-05-16 NOTE — Progress Notes (Signed)
Pt denies SI/HI/AVH and verbally agrees to approach staff if these become apparent or before harming themselves/others. Rates depression 0/10. Rates anxiety 0/10. Rates pain 0/10.  Scheduled medications administered to pt, per MD orders. RN provided support and encouragement to pt. Q15 min safety checks implemented and continued. Pt safe on the unit. RN will continue to monitor and intervene as needed.   Problem: Education: Goal: Will be free of psychotic symptoms Outcome: Not Progressing   Problem: Coping: Goal: Will verbalize feelings Outcome: Not Progressing    05/16/22 0759  Psych Admission Type (Psych Patients Only)  Admission Status Involuntary  Psychosocial Assessment  Patient Complaints None  Eye Contact Fair  Facial Expression Flat  Affect Flat  Speech Logical/coherent  Interaction Minimal;Forwards little;Guarded  Motor Activity Slow  Appearance/Hygiene In scrubs;Unremarkable  Behavior Characteristics Cooperative;Appropriate to situation;Calm  Mood Pleasant  Thought Process  Coherency WDL  Content Blaming others  Delusions None reported or observed  Perception WDL  Hallucination None reported or observed  Judgment Limited  Confusion None  Danger to Self  Current suicidal ideation? Denies  Danger to Others  Danger to Others None reported or observed

## 2022-05-16 NOTE — Plan of Care (Signed)
  Problem: Safety: Goal: Ability to remain free from injury will improve Outcome: Progressing   

## 2022-05-16 NOTE — Progress Notes (Signed)
[facility] MD Progress Note  05/16/2022 12:04 PM [name]  MRN:  [id number] Subjective: [name] seen on rounds.  She has been compliant and cooperative.  No side effects.  She has no complaints and no issues.  Principal Problem: Schizoaffective disorder, depressive type (HCC) Diagnosis: Principal Problem:   Schizoaffective disorder, depressive type (HCC)  Total Time spent with patient: 15 minutes  Past Psychiatric History: Schizophrenia  Past Medical History:  Past Medical History:  Diagnosis Date   Anemia 10/02/2021   Non compliance w medication regimen    Schizophrenia [facility])     Past Surgical History:  Procedure Laterality Date   BIOPSY  09/25/2021   Procedure: BIOPSY;  Surgeon: Rachael Fee, MD;  Location: [facility];  Service: Gastroenterology;;   ESOPHAGOGASTRODUODENOSCOPY (EGD) WITH PROPOFOL [name] 09/25/2021   Procedure: ESOPHAGOGASTRODUODENOSCOPY (EGD) WITH PROPOFOL;  Surgeon: Rachael Fee, MD;  Location: [facility];  Service: Gastroenterology;  Laterality: [name];  With NGT placement   Family History: History reviewed. No pertinent family history.  Social History:  Social History   Substance and Sexual Activity  Alcohol Use Not Currently   Comment: Refuses to disclose how much     Social History   Substance and Sexual Activity  Drug Use No   Comment: Refuses to answer    Social History   Socioeconomic History   Marital status: Single    Spouse name: Not on file   Number of children: Not on file   Years of education: Not on file   Highest education level: Not on file  Occupational History   Not on file  Tobacco Use   Smoking status: Every Day    Packs/day: 2.00    Types: Cigarettes   Smokeless tobacco: Never  Substance and Sexual Activity   Alcohol use: Not Currently    Comment: Refuses to disclose how much   Drug use: No    Comment: Refuses to answer   Sexual activity: Not Currently    Comment: refused to answer  Other Topics Concern   Not on file   Social History Narrative   Not on file   Social Determinants of Health   Financial Resource Strain: Not on file  Food Insecurity: No Food Insecurity (04/08/2022)   Hunger Vital Sign    Worried About Running Out of Food in the Last Year: Never true    Ran Out of Food in the Last Year: Never true  Transportation Needs: No Transportation Needs (04/08/2022)   PRAPARE - Administrator, Civil Service (Medical): No    Lack of Transportation (Non-Medical): No  Physical Activity: Not on file  Stress: Not on file  Social Connections: Not on file   Additional Social History:                         Sleep: Good  Appetite:  Good  Current Medications: Current Facility-Administered Medications  Medication Dose Route Frequency Provider Last Rate Last Admin   acetaminophen (TYLENOL) tablet 650 mg  650 mg Oral Q6H PRN [name], [name], MD       alum & mag hydroxide-simeth (MAALOX/MYLANTA) 200-200-20 MG/5ML suspension 30 mL  30 mL Oral Q4H PRN [name], [name], MD       haloperidol (HALDOL) tablet 10 mg  10 mg Oral BID [name], [name], MD   10 mg at 05/16/22 0759   Or   haloperidol lactate (HALDOL) injection 5 mg  5 mg Intramuscular BID [name],  [name], MD       lithium carbonate capsule 600 mg  600 mg Oral QHS [name], [name], MD   600 mg at 05/15/22 2148   LORazepam (ATIVAN) tablet 1 mg  1 mg Oral BID [name], [name], MD   1 mg at 05/16/22 9038   Or   LORazepam (ATIVAN) injection 1 mg  1 mg Intramuscular BID [name], [name], MD       magnesium hydroxide (MILK OF MAGNESIA) suspension 30 mL  30 mL Oral Daily PRN [name], [name], MD       mirtazapine (REMERON) tablet 30 mg  30 mg Oral QHS [name], [name], MD   30 mg at 05/15/22 2148   multivitamin with minerals tablet 1 tablet  1 tablet Oral Daily [name], [name], MD   1 tablet at 05/16/22 0759   OLANZapine (ZYPREXA) tablet 30 mg  30 mg Oral QHS [name], [name], MD   30 mg at 05/15/22 2148   thiamine (VITAMIN B1) tablet 100 mg   100 mg Oral Daily [name], [name], MD   100 mg at 05/16/22 3338    Lab Results: No results found for this or any previous visit (from the past 48 hour(s)).  Blood Alcohol level:  Lab Results  Component Value Date   ETH <10 03/29/2022   ETH <10 09/12/2021    Metabolic Disorder Labs: Lab Results  Component Value Date   HGBA1C 5.6 10/21/2021   MPG 114 10/21/2021   MPG 111 10/02/2021   Lab Results  Component Value Date   PROLACTIN 13.6 04/16/2015   Lab Results  Component Value Date   CHOL 189 10/21/2021   TRIG 52 10/21/2021   HDL 50 10/21/2021   CHOLHDL 3.8 10/21/2021   VLDL 10 10/21/2021   LDLCALC 129 (H) 10/21/2021   LDLCALC 93 10/01/2020    Physical Findings: AIMS: Facial and Oral Movements Muscles of Facial Expression: None, normal Lips and Perioral Area: None, normal Jaw: None, normal Tongue: None, normal,Extremity Movements Upper (arms, wrists, hands, fingers): None, normal Lower (legs, knees, ankles, toes): None, normal, Trunk Movements Neck, shoulders, hips: None, normal, Overall Severity Severity of abnormal movements (highest score from questions above): None, normal Incapacitation due to abnormal movements: None, normal Patient's awareness of abnormal movements (rate only patient's report): No Awareness, Dental Status Current problems with teeth and/or dentures?: No Does patient usually wear dentures?: No  CIWA:    COWS:     Musculoskeletal: Strength & Muscle Tone: within normal limits Gait & Station: normal Patient leans: [name]  Psychiatric Specialty Exam:  Presentation  General Appearance:  Disheveled  Eye Contact: Fair  Speech: Pressured  Speech Volume: Increased  Handedness: Right   Mood and Affect  Mood: Irritable; Labile; Angry  Affect: Blunt; Labile   Thought Process  Thought Processes: Disorganized; Irrevelant  Descriptions of Associations:Loose  Orientation:Partial  Thought Content:Illogical; Rumination;  Scattered; Tangential; Delusions  History of Schizophrenia/Schizoaffective disorder:Yes  Duration of Psychotic Symptoms:Greater than six months  Hallucinations:No data recorded Ideas of Reference:Percusatory; Paranoia  Suicidal Thoughts:No data recorded Homicidal Thoughts:No data recorded  Sensorium  Memory: Immediate Fair; Recent Fair; Remote Fair  Judgment: Impaired  Insight: Poor; Lacking   Executive Functions  Concentration: Poor  Attention Span: Poor  Recall: Poor  Fund of Knowledge: Poor  Language: Poor   Psychomotor Activity  Psychomotor Activity:No data recorded  Assets  Assets: Physical Health; Resilience; Housing; Social Support   Sleep  Sleep:No data recorded  Blood pressure 109/66, pulse (!) 54, temperature 97.8 F (36.6 C), temperature source Oral, resp. rate 18, height 5\' 4"  (1.626 m), weight 40.4 kg, SpO2 100 %. Body mass index is 15.28 kg/m.   Treatment Plan Summary: Daily contact with patient to assess and evaluate symptoms and progress in treatment, Medication management, and Plan continue current medications.  Parks Ranger, DO 05/16/2022, 12:04 PM

## 2022-05-17 DIAGNOSIS — F251 Schizoaffective disorder, depressive type: Secondary | ICD-10-CM | POA: Diagnosis not present

## 2022-05-17 NOTE — Progress Notes (Signed)
[facility] MD Progress Note  05/17/2022 11:46 AM [name]  MRN:  [id number] Subjective: Shevy is seen on rounds.  She is in the day room.  She has no complaints.  She has been compliant with her medications.  No side effects.  Pleasant and cooperative.  Mood and affect are stable.  Principal Problem: Schizoaffective disorder, depressive type ([place]) Diagnosis: Principal Problem:   Schizoaffective disorder, depressive type ([place])  Total Time spent with patient: 15 minutes  Past Psychiatric History: Chronic schizophrenia  Past Medical History:  Past Medical History:  Diagnosis Date   Anemia 10/02/2021   Non compliance w medication regimen    Schizophrenia [facility])     Past Surgical History:  Procedure Laterality Date   BIOPSY  09/25/2021   Procedure: BIOPSY;  Surgeon: Milus Banister, MD;  Location: [facility];  Service: Gastroenterology;;   ESOPHAGOGASTRODUODENOSCOPY (EGD) WITH PROPOFOL [name] 09/25/2021   Procedure: ESOPHAGOGASTRODUODENOSCOPY (EGD) WITH PROPOFOL;  Surgeon: Milus Banister, MD;  Location: [facility];  Service: Gastroenterology;  Laterality: [name];  With NGT placement   Family History: History reviewed. No pertinent family history.  Social History:  Social History   Substance and Sexual Activity  Alcohol Use Not Currently   Comment: Refuses to disclose how much     Social History   Substance and Sexual Activity  Drug Use No   Comment: Refuses to answer    Social History   Socioeconomic History   Marital status: Single    Spouse name: Not on file   Number of children: Not on file   Years of education: Not on file   Highest education level: Not on file  Occupational History   Not on file  Tobacco Use   Smoking status: Every Day    Packs/day: 2.00    Types: Cigarettes   Smokeless tobacco: Never  Substance and Sexual Activity   Alcohol use: Not Currently    Comment: Refuses to disclose how much   Drug use: No    Comment: Refuses to answer   Sexual activity:  Not Currently    Comment: refused to answer  Other Topics Concern   Not on file  Social History Narrative   Not on file   Social Determinants of Health   Financial Resource Strain: Not on file  Food Insecurity: No Food Insecurity (04/08/2022)   Hunger Vital Sign    Worried About Running Out of Food in the Last Year: Never true    Ran Out of Food in the Last Year: Never true  Transportation Needs: No Transportation Needs (04/08/2022)   PRAPARE - Hydrologist (Medical): No    Lack of Transportation (Non-Medical): No  Physical Activity: Not on file  Stress: Not on file  Social Connections: Not on file   Additional Social History:                         Sleep: Good  Appetite:  Good  Current Medications: Current Facility-Administered Medications  Medication Dose Route Frequency Provider Last Rate Last Admin   acetaminophen (TYLENOL) tablet 650 mg  650 mg Oral Q6H PRN [name], [name], MD       alum & mag hydroxide-simeth (MAALOX/MYLANTA) 200-200-20 MG/5ML suspension 30 mL  30 mL Oral Q4H PRN [name], [name], MD       haloperidol (HALDOL) tablet 10 mg  10 mg Oral BID [name], [name], MD   10 mg at 05/17/22 [id number]  Or   haloperidol lactate (HALDOL) injection 5 mg  5 mg Intramuscular BID [name], [name], MD       lithium carbonate capsule 600 mg  600 mg Oral QHS [name], [name], MD   600 mg at 05/16/22 2120   LORazepam (ATIVAN) tablet 1 mg  1 mg Oral BID [name], [name], MD   1 mg at 05/17/22 2706   Or   LORazepam (ATIVAN) injection 1 mg  1 mg Intramuscular BID [name], [name], MD       magnesium hydroxide (MILK OF MAGNESIA) suspension 30 mL  30 mL Oral Daily PRN [name], [name], MD       mirtazapine (REMERON) tablet 30 mg  30 mg Oral QHS [name], [name], MD   30 mg at 05/16/22 2120   multivitamin with minerals tablet 1 tablet  1 tablet Oral Daily [name], [name], MD   1 tablet at 05/17/22 0811   OLANZapine (ZYPREXA) tablet 30 mg  30 mg Oral QHS  [name], [name], MD   30 mg at 05/16/22 2119   thiamine (VITAMIN B1) tablet 100 mg  100 mg Oral Daily [name], [name], MD   100 mg at 05/17/22 2376    Lab Results: No results found for this or any previous visit (from the past 48 hour(s)).  Blood Alcohol level:  Lab Results  Component Value Date   ETH <10 03/29/2022   ETH <10 09/12/2021    Metabolic Disorder Labs: Lab Results  Component Value Date   HGBA1C 5.6 10/21/2021   MPG 114 10/21/2021   MPG 111 10/02/2021   Lab Results  Component Value Date   PROLACTIN 13.6 04/16/2015   Lab Results  Component Value Date   CHOL 189 10/21/2021   TRIG 52 10/21/2021   HDL 50 10/21/2021   CHOLHDL 3.8 10/21/2021   VLDL 10 10/21/2021   LDLCALC 129 (H) 10/21/2021   LDLCALC 93 10/01/2020    Physical Findings: AIMS: Facial and Oral Movements Muscles of Facial Expression: None, normal Lips and Perioral Area: None, normal Jaw: None, normal Tongue: None, normal,Extremity Movements Upper (arms, wrists, hands, fingers): None, normal Lower (legs, knees, ankles, toes): None, normal, Trunk Movements Neck, shoulders, hips: None, normal, Overall Severity Severity of abnormal movements (highest score from questions above): None, normal Incapacitation due to abnormal movements: None, normal Patient's awareness of abnormal movements (rate only patient's report): No Awareness, Dental Status Current problems with teeth and/or dentures?: No Does patient usually wear dentures?: No  CIWA:    COWS:     Musculoskeletal: Strength & Muscle Tone: within normal limits Gait & Station: normal Patient leans: [name]  Psychiatric Specialty Exam:  Presentation  General Appearance:  Disheveled  Eye Contact: Fair  Speech: Pressured  Speech Volume: Increased  Handedness: Right   Mood and Affect  Mood: Irritable; Labile; Angry  Affect: Blunt; Labile   Thought Process  Thought Processes: Disorganized; Irrevelant  Descriptions of  Associations:Loose  Orientation:Partial  Thought Content:Illogical; Rumination; Scattered; Tangential; Delusions  History of Schizophrenia/Schizoaffective disorder:Yes  Duration of Psychotic Symptoms:Greater than six months  Hallucinations:No data recorded Ideas of Reference:Percusatory; Paranoia  Suicidal Thoughts:No data recorded Homicidal Thoughts:No data recorded  Sensorium  Memory: Immediate Fair; Recent Fair; Remote Fair  Judgment: Impaired  Insight: Poor; Lacking   Executive Functions  Concentration: Poor  Attention Span: Poor  Recall: Poor  Fund of Knowledge: Poor  Language: Poor   Psychomotor Activity  Psychomotor Activity:No data recorded  Assets  Assets: Physical  Health; Resilience; Housing; Social Support   Sleep  Sleep:No data recorded   Blood pressure 120/67, pulse (!) 56, temperature 97.8 F (36.6 C), temperature source Oral, resp. rate 18, height 5\' 4"  (1.626 m), weight 40.4 kg, SpO2 100 %. Body mass index is 15.28 kg/m.   Treatment Plan Summary: Daily contact with patient to assess and evaluate symptoms and progress in treatment, Medication management, and Plan continue current medications.  , DO 05/17/2022, 11:46 AM

## 2022-05-17 NOTE — Plan of Care (Signed)
Pt is isolative and minimal. Adherent with scheduled medication. No signs of distress or injury. Pt denies SI / HI / AVH.    Problem: Education: Goal: Knowledge of General Education information will improve Description: Including pain rating scale, medication(s)/side effects and non-pharmacologic comfort measures Outcome: Not Progressing   Problem: Health Behavior/Discharge Planning: Goal: Ability to manage health-related needs will improve Outcome: Not Progressing

## 2022-05-17 NOTE — Group Note (Signed)
LCSW Group Therapy Note   Group Date: 05/17/2022 Start Time: 1300 End Time: 1400   Type of Therapy and Topic:  Group Therapy: Boundaries  Participation Level:  Did Not Attend  Description of Group: This group will address the use of boundaries in their personal lives. Patients will explore why boundaries are important, the difference between healthy and unhealthy boundaries, and negative and postive outcomes of different boundaries and will look at how boundaries can be crossed.  Patients will be encouraged to identify current boundaries in their own lives and identify what kind of boundary is being set. Facilitators will guide patients in utilizing problem-solving interventions to address and correct types boundaries being used and to address when no boundary is being used. Understanding and applying boundaries will be explored and addressed for obtaining and maintaining a balanced life. Patients will be encouraged to explore ways to assertively make their boundaries and needs known to significant others in their lives, using other group members and facilitator for role play, support, and feedback.  Therapeutic Goals:  1.  Patient will identify areas in their life where setting clear boundaries could be  used to improve their life.  2.  Patient will identify signs/triggers that a boundary is not being respected. 3.  Patient will identify two ways to set boundaries in order to achieve balance in  their lives: 4.  Patient will demonstrate ability to communicate their needs and set boundaries  through discussion and/or role plays  Summary of Patient Progress:   Patient did not attend group, though invited and encouraged by this clinician.    Therapeutic Modalities:   Cognitive Behavioral Therapy Solution-Focused Therapy  Rozann Lesches, LCSWA 05/17/2022  2:57 PM

## 2022-05-18 DIAGNOSIS — F251 Schizoaffective disorder, depressive type: Secondary | ICD-10-CM | POA: Diagnosis not present

## 2022-05-18 NOTE — Group Note (Signed)
[place] LCSW Group Therapy Note    Group Date: 05/18/2022 Start Time: 1300 End Time: 1400  Type of Therapy and Topic:  Group Therapy:  Overcoming Obstacles  Participation Level:  [facility] PARTICIPATION LEVEL: Did Not Attend   Description of Group:   In this group patients will be encouraged to explore what they see as obstacles to their own wellness and recovery. They will be guided to discuss their thoughts, feelings, and behaviors related to these obstacles. The group will process together ways to cope with barriers, with attention given to specific choices patients can make. Each patient will be challenged to identify changes they are motivated to make in order to overcome their obstacles. This group will be process-oriented, with patients participating in exploration of their own experiences as well as giving and receiving support and challenge from other group members.  Therapeutic Goals: 1. Patient will identify personal and current obstacles as they relate to admission. 2. Patient will identify barriers that currently interfere with their wellness or overcoming obstacles.  3. Patient will identify feelings, thought process and behaviors related to these barriers. 4. Patient will identify two changes they are willing to make to overcome these obstacles:    Summary of Patient Progress X   Therapeutic Modalities:   Cognitive Behavioral Therapy Solution Focused Therapy Motivational Interviewing Relapse Prevention Therapy   Shirl Harris, LCSW

## 2022-05-18 NOTE — Progress Notes (Signed)
Recreation Therapy Notes   Date: 05/18/2022  Time: 10:20 am    Location: Courtyard    Behavioral response: [name]   Intervention Topic: Wellness    Discussion/Intervention: Patient refused to attend group.   Clinical Observations/Feedback:  Patient refused to attend group.    Yohanna Tow LRT/CTRS         Chundra Sauerwein 05/18/2022 12:17 PM

## 2022-05-18 NOTE — Progress Notes (Signed)
Patient with flat affect, but brightens on approach. Denies SI, HI, AVH. Medication compliant. Appropriate with staff and peers. Minimal interaction with peers. Comes out for snack and meds, then isolates back to room. Slept all night in no distress. Encouragement and support provided. Safety checks maintained. Medications given as prescribed. Pt receptive and remains safe on unit with q 15 min checks.

## 2022-05-18 NOTE — Progress Notes (Signed)
[facility] MD Progress Note  05/18/2022 10:10 AM [name]  MRN:  [id number]  Subjective: Patient assessed on rounds after chart review. Patient agreed to speak with this writer if no personal questions were asked. Patient stand in corner of room not making eye contact. States she is feeling "fine" today and she is "just ready to go home". She is call and cooperative with assessment but when asked about SI/HI, anxiety, and depression she states "I am not here for that". Patient denies being in any pain. She is isolative to her room, compliant with medications and eating.  Working on getting her placed through the [place].  Principal Problem: Schizoaffective disorder, depressive type ([place]) Diagnosis: Principal Problem:   Schizoaffective disorder, depressive type ([place])  Total Time spent with patient: 30 minutes  Past Psychiatric History: Past history of schizophrenia  Past Medical History:  Past Medical History:  Diagnosis Date   Anemia 10/02/2021   Non compliance w medication regimen    Schizophrenia [facility])     Past Surgical History:  Procedure Laterality Date   BIOPSY  09/25/2021   Procedure: BIOPSY;  Surgeon: Milus Banister, MD;  Location: [facility];  Service: Gastroenterology;;   ESOPHAGOGASTRODUODENOSCOPY (EGD) WITH PROPOFOL [name] 09/25/2021   Procedure: ESOPHAGOGASTRODUODENOSCOPY (EGD) WITH PROPOFOL;  Surgeon: Milus Banister, MD;  Location: [facility];  Service: Gastroenterology;  Laterality: [name];  With NGT placement   Family History: History reviewed. No pertinent family history. Family Psychiatric  History: See previous Social History:  Social History   Substance and Sexual Activity  Alcohol Use Not Currently   Comment: Refuses to disclose how much     Social History   Substance and Sexual Activity  Drug Use No   Comment: Refuses to answer    Social History   Socioeconomic History   Marital status: Single    Spouse name: Not on file   Number of children: Not on file    Years of education: Not on file   Highest education level: Not on file  Occupational History   Not on file  Tobacco Use   Smoking status: Every Day    Packs/day: 2.00    Types: Cigarettes   Smokeless tobacco: Never  Substance and Sexual Activity   Alcohol use: Not Currently    Comment: Refuses to disclose how much   Drug use: No    Comment: Refuses to answer   Sexual activity: Not Currently    Comment: refused to answer  Other Topics Concern   Not on file  Social History Narrative   Not on file   Social Determinants of Health   Financial Resource Strain: Not on file  Food Insecurity: No Food Insecurity (04/08/2022)   Hunger Vital Sign    Worried About Running Out of Food in the Last Year: Never true    Ran Out of Food in the Last Year: Never true  Transportation Needs: No Transportation Needs (04/08/2022)   PRAPARE - Hydrologist (Medical): No    Lack of Transportation (Non-Medical): No  Physical Activity: Not on file  Stress: Not on file  Social Connections: Not on file   Additional Social History: lives with her mother  Sleep: Fair  Appetite:  Fair  Current Medications: Current Facility-Administered Medications  Medication Dose Route Frequency Provider Last Rate Last Admin   acetaminophen (TYLENOL) tablet 650 mg  650 mg Oral Q6H PRN [name], [name], MD       alum & mag hydroxide-simeth (  MAALOX/MYLANTA) 200-200-20 MG/5ML suspension 30 mL  30 mL Oral Q4H PRN [name], [name], MD       haloperidol (HALDOL) tablet 10 mg  10 mg Oral BID [name], [name], MD   10 mg at 05/18/22 [id number]   Or   haloperidol lactate (HALDOL) injection 5 mg  5 mg Intramuscular BID [name], [name], MD       lithium carbonate capsule 600 mg  600 mg Oral QHS [name], [name], MD   600 mg at 05/17/22 2051   LORazepam (ATIVAN) tablet 1 mg  1 mg Oral BID [name], [name], MD   1 mg at 05/18/22 [id number]   Or   LORazepam (ATIVAN) injection 1 mg  1 mg Intramuscular BID [name], John  T, MD       magnesium hydroxide (MILK OF MAGNESIA) suspension 30 mL  30 mL Oral Daily PRN [name], [name], MD       mirtazapine (REMERON) tablet 30 mg  30 mg Oral QHS [name], [name], MD   30 mg at 05/17/22 2050   multivitamin with minerals tablet 1 tablet  1 tablet Oral Daily [name], [name], MD   1 tablet at 05/18/22 0759   OLANZapine (ZYPREXA) tablet 30 mg  30 mg Oral QHS [name], [name], MD   30 mg at 05/17/22 2051   thiamine (VITAMIN B1) tablet 100 mg  100 mg Oral Daily [name], [name], MD   100 mg at 05/18/22 [id number]    Lab Results: No results found for this or any previous visit (from the past 48 hour(s)).  Blood Alcohol level:  Lab Results  Component Value Date   ETH <10 03/29/2022   ETH <10 [id number]    Metabolic Disorder Labs: Lab Results  Component Value Date   HGBA1C 5.6 10/21/2021   MPG 114 10/21/2021   MPG 111 10/02/2021   Lab Results  Component Value Date   PROLACTIN 13.6 04/16/2015   Lab Results  Component Value Date   CHOL 189 10/21/2021   TRIG 52 10/21/2021   HDL 50 10/21/2021   CHOLHDL 3.8 10/21/2021   VLDL 10 10/21/2021   LDLCALC 129 (H) 10/21/2021   LDLCALC 93 10/01/2020    Physical Findings: AIMS: Facial and Oral Movements Muscles of Facial Expression: None, normal Lips and Perioral Area: None, normal Jaw: None, normal Tongue: None, normal,Extremity Movements Upper (arms, wrists, hands, fingers): None, normal Lower (legs, knees, ankles, toes): None, normal, Trunk Movements Neck, shoulders, hips: None, normal, Overall Severity Severity of abnormal movements (highest score from questions above): None, normal Incapacitation due to abnormal movements: None, normal Patient's awareness of abnormal movements (rate only patient's report): No Awareness, Dental Status Current problems with teeth and/or dentures?: No Does patient usually wear dentures?: No  CIWA:    COWS:     Musculoskeletal: Strength & Muscle Tone: within normal limits Gait &  Station: normal Patient leans: [name]  Psychiatric Specialty Exam: General Appearance: Casual  Eye Contact:  Minimal  Speech:  WDL  Volume:  Normal  Mood:  Irritable at times  Affect:  Non-Congruent  Thought Process delusional  Orientation:  other; name, place "hospital"  Thought Content:  logical at times  Suicidal Thoughts:  No  Homicidal Thoughts:  No  Memory:  Immediate;   Fair Recent;   Fair Remote;   Fair  Judgement:  Fair  Insight:  Lacking  Psychomotor Activity:  WDL  Concentration:  WDL  Recall:  [employer] of Knowledge:  Fair  Language:  Good  Akathisia:  NA  Handed:    AIMS (if indicated):     Assets:  Desire for Improvement  ADL's:  intact  Cognition:  Impaired,  Mild  Sleep:  Number of Hours: 8.5       Review of Systems  Constitutional: Negative.   HENT: Negative.    Eyes: Negative.   Respiratory: Negative.    Cardiovascular: Negative.   Gastrointestinal: Negative.   Musculoskeletal: Negative.   Skin: Negative.   Allergic/Immunologic: Negative.   Neurological: Negative.   Hematological: Negative.   Psychiatric/Behavioral:  The patient is nervous/anxious.     Blood pressure 120/67, pulse (!) 56, temperature 97.8 F (36.6 C), temperature source Oral, resp. rate 18, height 5\' 4"  (1.626 m), weight 40.4 kg, SpO2 100 %.Body mass index is 15.28 kg/m.     Physical Exam: Physical Exam Vitals and nursing note reviewed.  Constitutional:      Appearance: Normal appearance.  HENT:     Head: Normocephalic.  Pulmonary:     Effort: Pulmonary effort is normal.  Musculoskeletal:        General: Normal range of motion.     Cervical back: Normal range of motion.  Neurological:     General: No focal deficit present.     Mental Status: She is alert. Mental status is at baseline.  Psychiatric:        Attention and Perception: Attention normal.        Mood and Affect: Affect is inappropriate.        Speech: Speech normal.        Behavior: Behavior is not  aggressive. Behavior is cooperative.        Thought Content: Thought content is delusional.        Cognition and Memory: Cognition and memory normal.    Review of Systems  Constitutional: Negative.   HENT: Negative.    Eyes: Negative.   Respiratory: Negative.    Cardiovascular: Negative.   Gastrointestinal: Negative.   Musculoskeletal: Negative.   Skin: Negative.   Neurological: Negative.   Psychiatric/Behavioral:  The patient is nervous/anxious.    Blood pressure 120/67, pulse (!) 56, temperature 97.8 F (36.6 C), temperature source Oral, resp. rate 18, height 5\' 4"  (1.626 m), weight 40.4 kg, SpO2 100 %. Body mass index is 15.28 kg/m.   Treatment Plan Summary: Assessment: Schizoaffetive Disorder, depressive type: Haldol 10 mg BID Lithium 600 mg at bedtime Zyprexa 30 mg daily at bedtime  Insomnia Remeron 30 mg daily at bedtime  Anxiety; Ativan 1 mg BID  Plan: Transfer to [place], NP 05/18/2022, 10:10 AM

## 2022-05-18 NOTE — Plan of Care (Signed)
Patient isolates to her room with a flat affect. Patient states " I am doing fine." Denies SI,HI and AVH. Patient does not like to answer multiple questions. Compliant with medications and meals. No issues verbalized. Appetite and energy level good. ADLs maintained. Support and encouragement given.

## 2022-05-18 NOTE — Plan of Care (Signed)
  Problem: Nutrition: Goal: Adequate nutrition will be maintained Outcome: Progressing   Problem: Coping: Goal: Level of anxiety will decrease Outcome: Progressing   Problem: Safety: Goal: Ability to remain free from injury will improve Outcome: Progressing   Problem: Coping: Goal: Coping ability will improve Outcome: Progressing   Problem: Safety: Goal: Ability to redirect hostility and anger into socially appropriate behaviors will improve Outcome: Progressing   Problem: Safety: Goal: Ability to redirect hostility and anger into socially appropriate behaviors will improve Outcome: Progressing

## 2022-05-19 DIAGNOSIS — F251 Schizoaffective disorder, depressive type: Secondary | ICD-10-CM | POA: Diagnosis not present

## 2022-05-19 NOTE — Progress Notes (Signed)
[facility] MD Progress Note  05/19/2022 9:48 AM [name]  MRN:  [id number]  Subjective: Patient assessed on rounds after chart review. Patient reports "I didn't come here for that" when asked about any SI/HI.  Denies everything and stated she slept well, "and my appetite is fine." She is isolative to her room, compliant with medications and eating.  Working on getting her placed through the [place].  Principal Problem: Schizoaffective disorder, depressive type ([place]) Diagnosis: Principal Problem:   Schizoaffective disorder, depressive type ([place])  Total Time spent with patient: 30 minutes  Past Psychiatric History: Past history of schizophrenia  Past Medical History:  Past Medical History:  Diagnosis Date   Anemia 10/02/2021   Non compliance w medication regimen    Schizophrenia [facility])     Past Surgical History:  Procedure Laterality Date   BIOPSY  09/25/2021   Procedure: BIOPSY;  Surgeon: Milus Banister, MD;  Location: [facility];  Service: Gastroenterology;;   ESOPHAGOGASTRODUODENOSCOPY (EGD) WITH PROPOFOL [name] 09/25/2021   Procedure: ESOPHAGOGASTRODUODENOSCOPY (EGD) WITH PROPOFOL;  Surgeon: Milus Banister, MD;  Location: [facility];  Service: Gastroenterology;  Laterality: [name];  With NGT placement   Family History: History reviewed. No pertinent family history. Family Psychiatric  History: See previous Social History:  Social History   Substance and Sexual Activity  Alcohol Use Not Currently   Comment: Refuses to disclose how much     Social History   Substance and Sexual Activity  Drug Use No   Comment: Refuses to answer    Social History   Socioeconomic History   Marital status: Single    Spouse name: Not on file   Number of children: Not on file   Years of education: Not on file   Highest education level: Not on file  Occupational History   Not on file  Tobacco Use   Smoking status: Every Day    Packs/day: 2.00    Types: Cigarettes   Smokeless tobacco: Never   Substance and Sexual Activity   Alcohol use: Not Currently    Comment: Refuses to disclose how much   Drug use: No    Comment: Refuses to answer   Sexual activity: Not Currently    Comment: refused to answer  Other Topics Concern   Not on file  Social History Narrative   Not on file   Social Determinants of Health   Financial Resource Strain: Not on file  Food Insecurity: No Food Insecurity (04/08/2022)   Hunger Vital Sign    Worried About Running Out of Food in the Last Year: Never true    Ran Out of Food in the Last Year: Never true  Transportation Needs: No Transportation Needs (04/08/2022)   PRAPARE - Hydrologist (Medical): No    Lack of Transportation (Non-Medical): No  Physical Activity: Not on file  Stress: Not on file  Social Connections: Not on file   Additional Social History: lives with her mother  Sleep: Fair  Appetite:  Fair  Current Medications: Current Facility-Administered Medications  Medication Dose Route Frequency Provider Last Rate Last Admin   acetaminophen (TYLENOL) tablet 650 mg  650 mg Oral Q6H PRN [name], [name], MD       alum & mag hydroxide-simeth (MAALOX/MYLANTA) 200-200-20 MG/5ML suspension 30 mL  30 mL Oral Q4H PRN [name], [name], MD       haloperidol (HALDOL) tablet 10 mg  10 mg Oral BID [name], [name], MD   10 mg  at 05/19/22 0753   Or   haloperidol lactate (HALDOL) injection 5 mg  5 mg Intramuscular BID [name], [name], MD       lithium carbonate capsule 600 mg  600 mg Oral QHS [name], [name], MD   600 mg at 05/18/22 2048   LORazepam (ATIVAN) tablet 1 mg  1 mg Oral BID [name], [name], MD   1 mg at 05/19/22 6314   Or   LORazepam (ATIVAN) injection 1 mg  1 mg Intramuscular BID [name], [name], MD       magnesium hydroxide (MILK OF MAGNESIA) suspension 30 mL  30 mL Oral Daily PRN [name], [name], MD       mirtazapine (REMERON) tablet 30 mg  30 mg Oral QHS [name], [name], MD   30 mg at 05/18/22 2048    multivitamin with minerals tablet 1 tablet  1 tablet Oral Daily [name], [name], MD   1 tablet at 05/19/22 0753   OLANZapine (ZYPREXA) tablet 30 mg  30 mg Oral QHS [name], [name], MD   30 mg at 05/18/22 2048   thiamine (VITAMIN B1) tablet 100 mg  100 mg Oral Daily [name], [name], MD   100 mg at 05/19/22 9702    Lab Results: No results found for this or any previous visit (from the past 48 hour(s)).  Blood Alcohol level:  Lab Results  Component Value Date   ETH <10 03/29/2022   ETH <10 09/12/2021    Metabolic Disorder Labs: Lab Results  Component Value Date   HGBA1C 5.6 10/21/2021   MPG 114 10/21/2021   MPG 111 10/02/2021   Lab Results  Component Value Date   PROLACTIN 13.6 04/16/2015   Lab Results  Component Value Date   CHOL 189 10/21/2021   TRIG 52 10/21/2021   HDL 50 10/21/2021   CHOLHDL 3.8 10/21/2021   VLDL 10 10/21/2021   LDLCALC 129 (H) 10/21/2021   LDLCALC 93 10/01/2020    Physical Findings: AIMS: Facial and Oral Movements Muscles of Facial Expression: None, normal Lips and Perioral Area: None, normal Jaw: None, normal Tongue: None, normal,Extremity Movements Upper (arms, wrists, hands, fingers): None, normal Lower (legs, knees, ankles, toes): None, normal, Trunk Movements Neck, shoulders, hips: None, normal, Overall Severity Severity of abnormal movements (highest score from questions above): None, normal Incapacitation due to abnormal movements: None, normal Patient's awareness of abnormal movements (rate only patient's report): No Awareness, Dental Status Current problems with teeth and/or dentures?: No Does patient usually wear dentures?: No  CIWA:    COWS:     Musculoskeletal: Strength & Muscle Tone: within normal limits Gait & Station: normal Patient leans: [name]  Psychiatric Specialty Exam: General Appearance: Casual  Eye Contact:  Minimal  Speech:  WDL  Volume:  Normal  Mood:  Irritable at times  Affect:  Non-Congruent  Thought Process  delusional  Orientation:  other; name, place "hospital"  Thought Content:  logical at times  Suicidal Thoughts:  No  Homicidal Thoughts:  No  Memory:  Immediate;   Fair Recent;   Fair Remote;   Fair  Judgement:  Fair  Insight:  Lacking  Psychomotor Activity:  WDL  Concentration:  WDL  Recall:  [employer] of Knowledge:  Fair  Language:  Good  Akathisia:  NA  Handed:    AIMS (if indicated):     Assets:  Desire for Improvement  ADL's:  intact  Cognition:  Impaired,  Mild  Sleep:  Number  of Hours: 8.5       Review of Systems  Constitutional: Negative.   HENT: Negative.    Eyes: Negative.   Respiratory: Negative.    Cardiovascular: Negative.   Gastrointestinal: Negative.   Musculoskeletal: Negative.   Skin: Negative.   Allergic/Immunologic: Negative.   Neurological: Negative.   Hematological: Negative.   Psychiatric/Behavioral:  The patient is nervous/anxious.     Blood pressure 101/71, pulse 61, temperature 98.3 F (36.8 C), resp. rate 16, height 5\' 4"  (1.626 m), weight 40.4 kg, SpO2 100 %.Body mass index is 15.28 kg/m.     Physical Exam: Physical Exam Vitals and nursing note reviewed.  Constitutional:      Appearance: Normal appearance.  HENT:     Head: Normocephalic.  Pulmonary:     Effort: Pulmonary effort is normal.  Musculoskeletal:        General: Normal range of motion.     Cervical back: Normal range of motion.  Neurological:     General: No focal deficit present.     Mental Status: She is alert. Mental status is at baseline.  Psychiatric:        Attention and Perception: Attention normal.        Mood and Affect: Affect is inappropriate.        Speech: Speech normal.        Behavior: Behavior is not aggressive. Behavior is cooperative.        Thought Content: Thought content is delusional.        Cognition and Memory: Cognition and memory normal.    Review of Systems  Constitutional: Negative.   HENT: Negative.    Eyes: Negative.    Respiratory: Negative.    Cardiovascular: Negative.   Gastrointestinal: Negative.   Musculoskeletal: Negative.   Skin: Negative.   Neurological: Negative.   Psychiatric/Behavioral:  The patient is nervous/anxious.    Blood pressure 101/71, pulse 61, temperature 98.3 F (36.8 C), resp. rate 16, height 5\' 4"  (1.626 m), weight 40.4 kg, SpO2 100 %. Body mass index is 15.28 kg/m.   Treatment Plan Summary: Assessment: Schizoaffetive Disorder, depressive type: Haldol 10 mg BID Lithium 600 mg at bedtime Zyprexa 30 mg daily at bedtime  Insomnia Remeron 30 mg daily at bedtime  Anxiety; Ativan 1 mg BID  Plan: Transfer to [facility], NP 05/19/2022, 9:48 AM

## 2022-05-19 NOTE — Group Note (Signed)
[facility] LCSW Group Therapy Note   Group Date: 05/19/2022 Start Time: 1400 End Time: 1500  Type of Therapy/Topic:  Group Therapy:  Feelings about Diagnosis  Participation Level:  Did Not Attend    Description of Group:    This group will allow patients to explore their thoughts and feelings about diagnoses they have received. Patients will be guided to explore their level of understanding and acceptance of these diagnoses. Facilitator will encourage patients to process their thoughts and feelings about the reactions of others to their diagnosis, and will guide patients in identifying ways to discuss their diagnosis with significant others in their lives. This group will be process-oriented, with patients participating in exploration of their own experiences as well as giving and receiving support and challenge from other group members.   Therapeutic Goals: 1. Patient will demonstrate understanding of diagnosis as evidence by identifying two or more symptoms of the disorder:  2. Patient will be able to express two feelings regarding the diagnosis 3. Patient will demonstrate ability to communicate their needs through discussion and/or role plays  Summary of Patient Progress: X   Therapeutic Modalities:   Cognitive Behavioral Therapy Brief Therapy Feelings Identification    Ieisha Gao R Alita Waldren, LCSW 

## 2022-05-19 NOTE — Plan of Care (Signed)
Patient continues to isolates in her room. Compliant with meds and meals. Denies SI,HI and AVH. Minimal interactions with staff & peers.Appetite and energy level good. Support and encouragement given.

## 2022-05-19 NOTE — Plan of Care (Signed)
  Problem: Education: Goal: Knowledge of General Education information will improve Description: Including pain rating scale, medication(s)/side effects and non-pharmacologic comfort measures Outcome: Progressing   Problem: Health Behavior/Discharge Planning: Goal: Ability to manage health-related needs will improve Outcome: Progressing   Problem: Clinical Measurements: Goal: Ability to maintain clinical measurements within normal limits will improve Outcome: Progressing Goal: Will remain free from infection Outcome: Progressing Goal: Diagnostic test results will improve Outcome: Progressing Goal: Respiratory complications will improve Outcome: Progressing Goal: Cardiovascular complication will be avoided Outcome: Progressing   Problem: Safety: Goal: Periods of time without injury will increase Outcome: Progressing   Problem: Physical Regulation: Goal: Ability to maintain clinical measurements within normal limits will improve Outcome: Progressing   Problem: Health Behavior/Discharge Planning: Goal: Identification of resources available to assist in meeting health care needs will improve Outcome: Progressing Goal: Compliance with treatment plan for underlying cause of condition will improve Outcome: Progressing   

## 2022-05-19 NOTE — Progress Notes (Signed)
Recreation Therapy Notes  Date: 05/19/2022   Time: 10:20 am   Location: Craft room    Behavioral response: [name]   Intervention Topic: Values     Discussion/Intervention: Patient refused to attend group.   Clinical Observations/Feedback:  Patient refused to attend group.   Kelyn Ponciano LRT/CTRS        Relda Agosto 05/19/2022 10:57 AM

## 2022-05-20 DIAGNOSIS — F251 Schizoaffective disorder, depressive type: Secondary | ICD-10-CM | POA: Diagnosis not present

## 2022-05-20 NOTE — Plan of Care (Signed)
D- Patient alert and oriented. Patient presented in a pleasant mood on assessment reporting that she slept "good" last night and had no complaints to voice to this Probation officer. Patient denied SI, HI, AVH, and pain at this time. Patient also denied any signs/symptoms of depression and anxiety. Per her self-inventory, patient's stated goal for today is "discharge".  A- Scheduled medications administered to patient, per MD orders. Support and encouragement provided.  Routine safety checks conducted every 15 minutes.  Patient informed to notify staff with problems or concerns.  R- No adverse drug reactions noted. Patient contracts for safety at this time. Patient compliant with medications. Patient receptive, calm, and cooperative. Patient isolates to room, except for meals and medication. However, at times patient does come down to the dayroom and sit right before meals. Patient remains safe at this time.  Problem: Education: Goal: Knowledge of General Education information will improve Description: Including pain rating scale, medication(s)/side effects and non-pharmacologic comfort measures Outcome: Progressing   Problem: Health Behavior/Discharge Planning: Goal: Ability to manage health-related needs will improve Outcome: Progressing   Problem: Clinical Measurements: Goal: Ability to maintain clinical measurements within normal limits will improve Outcome: Progressing Goal: Will remain free from infection Outcome: Progressing Goal: Diagnostic test results will improve Outcome: Progressing Goal: Respiratory complications will improve Outcome: Progressing Goal: Cardiovascular complication will be avoided Outcome: Progressing   Problem: Activity: Goal: Risk for activity intolerance will decrease Outcome: Progressing   Problem: Nutrition: Goal: Adequate nutrition will be maintained Outcome: Progressing   Problem: Coping: Goal: Level of anxiety will decrease Outcome: Progressing    Problem: Elimination: Goal: Will not experience complications related to bowel motility Outcome: Progressing Goal: Will not experience complications related to urinary retention Outcome: Progressing   Problem: Pain Managment: Goal: General experience of comfort will improve Outcome: Progressing   Problem: Safety: Goal: Ability to remain free from injury will improve Outcome: Progressing   Problem: Skin Integrity: Goal: Risk for impaired skin integrity will decrease Outcome: Progressing   Problem: Activity: Goal: Will verbalize the importance of balancing activity with adequate rest periods Outcome: Progressing   Problem: Education: Goal: Will be free of psychotic symptoms Outcome: Progressing Goal: Knowledge of the prescribed therapeutic regimen will improve Outcome: Progressing   Problem: Coping: Goal: Coping ability will improve Outcome: Progressing Goal: Will verbalize feelings Outcome: Progressing   Problem: Health Behavior/Discharge Planning: Goal: Compliance with prescribed medication regimen will improve Outcome: Progressing   Problem: Nutritional: Goal: Ability to achieve adequate nutritional intake will improve Outcome: Progressing   Problem: Role Relationship: Goal: Ability to communicate needs accurately will improve Outcome: Progressing Goal: Ability to interact with others will improve Outcome: Progressing   Problem: Safety: Goal: Ability to redirect hostility and anger into socially appropriate behaviors will improve Outcome: Progressing Goal: Ability to remain free from injury will improve Outcome: Progressing   Problem: Self-Care: Goal: Ability to participate in self-care as condition permits will improve Outcome: Progressing   Problem: Self-Concept: Goal: Will verbalize positive feelings about self Outcome: Progressing   Problem: Education: Goal: Knowledge of [facility] Education information/materials will improve Outcome:  Progressing Goal: Emotional status will improve Outcome: Progressing Goal: Mental status will improve Outcome: Progressing Goal: Verbalization of understanding the information provided will improve Outcome: Progressing   Problem: Activity: Goal: Interest or engagement in activities will improve Outcome: Progressing Goal: Sleeping patterns will improve Outcome: Progressing   Problem: Coping: Goal: Ability to verbalize frustrations and anger appropriately will improve Outcome: Progressing Goal: Ability to demonstrate self-control will  improve Outcome: Progressing   Problem: Health Behavior/Discharge Planning: Goal: Identification of resources available to assist in meeting health care needs will improve Outcome: Progressing Goal: Compliance with treatment plan for underlying cause of condition will improve Outcome: Progressing   Problem: Physical Regulation: Goal: Ability to maintain clinical measurements within normal limits will improve Outcome: Progressing   Problem: Safety: Goal: Periods of time without injury will increase Outcome: Progressing

## 2022-05-20 NOTE — Progress Notes (Signed)
Patient is isolate to room.  She is med compliant. Denies having any active psychotic symptoms.  Continues to refuse to engage with her sister.  Says the sheriff can take her home and that she does not need her sister to pick her up. Will continue offer support and encouragement.   C Butler-Nicholson, LPN

## 2022-05-20 NOTE — Group Note (Signed)
[place] LCSW Group Therapy Note   Group Date: 05/20/2022 Start Time: 1300 End Time: 1400   Type of Therapy/Topic:  Group Therapy:  Emotion Regulation  Participation Level:  Did Not Attend    Description of Group:    The purpose of this group is to assist patients in learning to regulate negative emotions and experience positive emotions. Patients will be guided to discuss ways in which they have been vulnerable to their negative emotions. These vulnerabilities will be juxtaposed with experiences of positive emotions or situations, and patients challenged to use positive emotions to combat negative ones. Special emphasis will be placed on coping with negative emotions in conflict situations, and patients will process healthy conflict resolution skills.  Therapeutic Goals: Patient will identify two positive emotions or experiences to reflect on in order to balance out negative emotions:  Patient will label two or more emotions that they find the most difficult to experience:  Patient will be able to demonstrate positive conflict resolution skills through discussion or role plays:   Summary of Patient Progress: Patient declined to attend despite personal invitation.   Therapeutic Modalities:   Cognitive Behavioral Therapy Feelings Identification Dialectical Behavioral Therapy   Shirl Harris, LCSW

## 2022-05-20 NOTE — Progress Notes (Signed)
[facility] MD Progress Note  05/20/2022 11:41 AM [name]  MRN:  [id number]  Subjective: Patient assessed on rounds after chart review. Patient will only state, "I just want to go home".  Denies any issues.  She is isolative to her room, compliant with medications and eating.  Working on getting her placed through the [place].  Principal Problem: Schizoaffective disorder, depressive type ([place]) Diagnosis: Principal Problem:   Schizoaffective disorder, depressive type ([place])  Total Time spent with patient: 30 minutes  Past Psychiatric History: Past history of schizophrenia  Past Medical History:  Past Medical History:  Diagnosis Date   Anemia 10/02/2021   Non compliance w medication regimen    Schizophrenia [facility])     Past Surgical History:  Procedure Laterality Date   BIOPSY  09/25/2021   Procedure: BIOPSY;  Surgeon: Milus Banister, MD;  Location: [facility];  Service: Gastroenterology;;   ESOPHAGOGASTRODUODENOSCOPY (EGD) WITH PROPOFOL [name] 09/25/2021   Procedure: ESOPHAGOGASTRODUODENOSCOPY (EGD) WITH PROPOFOL;  Surgeon: Milus Banister, MD;  Location: [facility];  Service: Gastroenterology;  Laterality: [name];  With NGT placement   Family History: History reviewed. No pertinent family history. Family Psychiatric  History: See previous Social History:  Social History   Substance and Sexual Activity  Alcohol Use Not Currently   Comment: Refuses to disclose how much     Social History   Substance and Sexual Activity  Drug Use No   Comment: Refuses to answer    Social History   Socioeconomic History   Marital status: Single    Spouse name: Not on file   Number of children: Not on file   Years of education: Not on file   Highest education level: Not on file  Occupational History   Not on file  Tobacco Use   Smoking status: Every Day    Packs/day: 2.00    Types: Cigarettes   Smokeless tobacco: Never  Substance and Sexual Activity   Alcohol use: Not Currently    Comment:  Refuses to disclose how much   Drug use: No    Comment: Refuses to answer   Sexual activity: Not Currently    Comment: refused to answer  Other Topics Concern   Not on file  Social History Narrative   Not on file   Social Determinants of Health   Financial Resource Strain: Not on file  Food Insecurity: No Food Insecurity (04/08/2022)   Hunger Vital Sign    Worried About Running Out of Food in the Last Year: Never true    Ran Out of Food in the Last Year: Never true  Transportation Needs: No Transportation Needs (04/08/2022)   PRAPARE - Hydrologist (Medical): No    Lack of Transportation (Non-Medical): No  Physical Activity: Not on file  Stress: Not on file  Social Connections: Not on file   Additional Social History: lives with her mother  Sleep: Fair  Appetite:  Fair  Current Medications: Current Facility-Administered Medications  Medication Dose Route Frequency Provider Last Rate Last Admin   acetaminophen (TYLENOL) tablet 650 mg  650 mg Oral Q6H PRN [name], [name], MD       alum & mag hydroxide-simeth (MAALOX/MYLANTA) 200-200-20 MG/5ML suspension 30 mL  30 mL Oral Q4H PRN [name], [name], MD       haloperidol (HALDOL) tablet 10 mg  10 mg Oral BID [name], [name], MD   10 mg at 05/20/22 0818   Or   haloperidol lactate (HALDOL)  injection 5 mg  5 mg Intramuscular BID [name], [name], MD       lithium carbonate capsule 600 mg  600 mg Oral QHS [name], [name], MD   600 mg at 05/19/22 2110   LORazepam (ATIVAN) tablet 1 mg  1 mg Oral BID [name], [name], MD   1 mg at 05/20/22 0818   Or   LORazepam (ATIVAN) injection 1 mg  1 mg Intramuscular BID [name], [name], MD       magnesium hydroxide (MILK OF MAGNESIA) suspension 30 mL  30 mL Oral Daily PRN [name], [name], MD       mirtazapine (REMERON) tablet 30 mg  30 mg Oral QHS [name], [name], MD   30 mg at 05/19/22 2110   multivitamin with minerals tablet 1 tablet  1 tablet Oral Daily [name], [name],  MD   1 tablet at 05/20/22 0818   OLANZapine (ZYPREXA) tablet 30 mg  30 mg Oral QHS [name], [name], MD   30 mg at 05/19/22 2110   thiamine (VITAMIN B1) tablet 100 mg  100 mg Oral Daily [name], [name], MD   100 mg at 05/20/22 0818    Lab Results: No results found for this or any previous visit (from the past 48 hour(s)).  Blood Alcohol level:  Lab Results  Component Value Date   ETH <10 03/29/2022   ETH <10 95/28/4132    Metabolic Disorder Labs: Lab Results  Component Value Date   HGBA1C 5.6 10/21/2021   MPG 114 10/21/2021   MPG 111 10/02/2021   Lab Results  Component Value Date   PROLACTIN 13.6 04/16/2015   Lab Results  Component Value Date   CHOL 189 10/21/2021   TRIG 52 10/21/2021   HDL 50 10/21/2021   CHOLHDL 3.8 10/21/2021   VLDL 10 10/21/2021   LDLCALC 129 (H) 10/21/2021   LDLCALC 93 10/01/2020    Physical Findings: AIMS: Facial and Oral Movements Muscles of Facial Expression: None, normal Lips and Perioral Area: None, normal Jaw: None, normal Tongue: None, normal,Extremity Movements Upper (arms, wrists, hands, fingers): None, normal Lower (legs, knees, ankles, toes): None, normal, Trunk Movements Neck, shoulders, hips: None, normal, Overall Severity Severity of abnormal movements (highest score from questions above): None, normal Incapacitation due to abnormal movements: None, normal Patient's awareness of abnormal movements (rate only patient's report): No Awareness, Dental Status Current problems with teeth and/or dentures?: No Does patient usually wear dentures?: No  CIWA:    COWS:     Musculoskeletal: Strength & Muscle Tone: within normal limits Gait & Station: normal Patient leans: [name]  Psychiatric Specialty Exam: General Appearance: Casual  Eye Contact:  Minimal  Speech:  WDL  Volume:  Normal  Mood:  Irritable at times  Affect:  Non-Congruent  Thought Process delusional  Orientation:  other; name, place "hospital"  Thought Content:   logical at times  Suicidal Thoughts:  No  Homicidal Thoughts:  No  Memory:  Immediate;   Fair Recent;   Fair Remote;   Fair  Judgement:  Fair  Insight:  Lacking  Psychomotor Activity:  WDL  Concentration:  WDL  Recall:  [employer] of Knowledge:  Fair  Language:  Good  Akathisia:  NA  Handed:    AIMS (if indicated):     Assets:  Desire for Improvement  ADL's:  intact  Cognition:  Impaired,  Mild  Sleep:  Number of Hours: 8.5       Review of  Systems  Constitutional: Negative.   HENT: Negative.    Eyes: Negative.   Respiratory: Negative.    Cardiovascular: Negative.   Gastrointestinal: Negative.   Musculoskeletal: Negative.   Skin: Negative.   Allergic/Immunologic: Negative.   Neurological: Negative.   Hematological: Negative.   Psychiatric/Behavioral:  The patient is nervous/anxious.     Blood pressure 108/65, pulse 64, temperature 98.3 F (36.8 C), temperature source Oral, resp. rate 16, height 5\' 4"  (1.626 m), weight 40.4 kg, SpO2 100 %.Body mass index is 15.28 kg/m.     Physical Exam: Physical Exam Vitals and nursing note reviewed.  Constitutional:      Appearance: Normal appearance.  HENT:     Head: Normocephalic.  Pulmonary:     Effort: Pulmonary effort is normal.  Musculoskeletal:        General: Normal range of motion.     Cervical back: Normal range of motion.  Neurological:     General: No focal deficit present.     Mental Status: She is alert. Mental status is at baseline.  Psychiatric:        Attention and Perception: Attention normal.        Mood and Affect: Affect is inappropriate.        Speech: Speech normal.        Behavior: Behavior is not aggressive. Behavior is cooperative.        Thought Content: Thought content is delusional.        Cognition and Memory: Cognition and memory normal.    Review of Systems  Constitutional: Negative.   HENT: Negative.    Eyes: Negative.   Respiratory: Negative.    Cardiovascular: Negative.    Gastrointestinal: Negative.   Musculoskeletal: Negative.   Skin: Negative.   Neurological: Negative.   Psychiatric/Behavioral:  The patient is nervous/anxious.    Blood pressure 108/65, pulse 64, temperature 98.3 F (36.8 C), temperature source Oral, resp. rate 16, height 5\' 4"  (1.626 m), weight 40.4 kg, SpO2 100 %. Body mass index is 15.28 kg/m.   Treatment Plan Summary: Assessment: Schizoaffetive Disorder, depressive type: Haldol 10 mg BID Lithium 600 mg at bedtime Zyprexa 30 mg daily at bedtime  Insomnia Remeron 30 mg daily at bedtime  Anxiety; Ativan 1 mg BID  Plan: Transfer to [facility], NP 05/20/2022, 11:41 AM

## 2022-05-20 NOTE — Plan of Care (Signed)
  Problem: Education: Goal: Knowledge of General Education information will improve Description: Including pain rating scale, medication(s)/side effects and non-pharmacologic comfort measures Outcome: Progressing   Problem: Health Behavior/Discharge Planning: Goal: Ability to manage health-related needs will improve Outcome: Progressing   Problem: Clinical Measurements: Goal: Ability to maintain clinical measurements within normal limits will improve Outcome: Progressing Goal: Will remain free from infection Outcome: Progressing Goal: Diagnostic test results will improve Outcome: Progressing Goal: Respiratory complications will improve Outcome: Progressing Goal: Cardiovascular complication will be avoided Outcome: Progressing   Problem: Safety: Goal: Periods of time without injury will increase Outcome: Progressing   Problem: Physical Regulation: Goal: Ability to maintain clinical measurements within normal limits will improve Outcome: Progressing   Problem: Health Behavior/Discharge Planning: Goal: Identification of resources available to assist in meeting health care needs will improve Outcome: Progressing Goal: Compliance with treatment plan for underlying cause of condition will improve Outcome: Progressing   Problem: Coping: Goal: Ability to verbalize frustrations and anger appropriately will improve Outcome: Progressing Goal: Ability to demonstrate self-control will improve Outcome: Progressing   

## 2022-05-20 NOTE — Progress Notes (Signed)
Recreation Therapy Notes    Date: 05/20/2022   Time: 10:10 am   Location: Craft room    Behavioral response: [name]   Intervention Topic: Honesty    Discussion/Intervention: Patient refused to attend group.   Clinical Observations/Feedback:  Patient refused to attend group.   Michael Ventresca LRT/CTRS          Laikyn Gewirtz 05/20/2022 10:26 AM

## 2022-05-21 DIAGNOSIS — F251 Schizoaffective disorder, depressive type: Secondary | ICD-10-CM | POA: Diagnosis not present

## 2022-05-21 NOTE — Progress Notes (Addendum)
Recreation Therapy Notes  Date: 05/21/2022   Time: 2:15pm   Location: Courtyard    Behavioral response: [name]   Group Type: Recreation and Leisure   Participation level: [name]   Communication: Patient refused to attend.   Comments: [name]   Ollivander See LRT/CTRS        Karlea Mckibbin 05/21/2022 2:57 PM

## 2022-05-21 NOTE — Progress Notes (Signed)
Recreation Therapy Notes  Date: 05/21/2022   Time: 10:15 am   Location: Craft room    Behavioral response: [name]   Intervention Topic: Time Management    Discussion/Intervention: Patient refused to attend group.   Clinical Observations/Feedback:  Patient refused to attend group.   Lorene Samaan LRT/CTRS        Jahni Paul 05/21/2022 11:08 AM

## 2022-05-21 NOTE — Progress Notes (Signed)
[facility] MD Progress Note  05/21/2022 11:52 AM [name]  MRN:  [id number]  Subjective: Patient assessed on rounds after chart review. Patient states, "I'm fine".  Denies any issues.  She is isolative to her room, compliant with medications and eating.  Working on getting her placed through the [place].  Principal Problem: Schizoaffective disorder, depressive type ([place]) Diagnosis: Principal Problem:   Schizoaffective disorder, depressive type ([place])  Total Time spent with patient: 30 minutes  Past Psychiatric History: Past history of schizophrenia  Past Medical History:  Past Medical History:  Diagnosis Date   Anemia 10/02/2021   Non compliance w medication regimen    Schizophrenia [facility])     Past Surgical History:  Procedure Laterality Date   BIOPSY  09/25/2021   Procedure: BIOPSY;  Surgeon: Milus Banister, MD;  Location: [facility];  Service: Gastroenterology;;   ESOPHAGOGASTRODUODENOSCOPY (EGD) WITH PROPOFOL [name] 09/25/2021   Procedure: ESOPHAGOGASTRODUODENOSCOPY (EGD) WITH PROPOFOL;  Surgeon: Milus Banister, MD;  Location: [facility];  Service: Gastroenterology;  Laterality: [name];  With NGT placement   Family History: History reviewed. No pertinent family history. Family Psychiatric  History: See previous Social History:  Social History   Substance and Sexual Activity  Alcohol Use Not Currently   Comment: Refuses to disclose how much     Social History   Substance and Sexual Activity  Drug Use No   Comment: Refuses to answer    Social History   Socioeconomic History   Marital status: Single    Spouse name: Not on file   Number of children: Not on file   Years of education: Not on file   Highest education level: Not on file  Occupational History   Not on file  Tobacco Use   Smoking status: Every Day    Packs/day: 2.00    Types: Cigarettes   Smokeless tobacco: Never  Substance and Sexual Activity   Alcohol use: Not Currently    Comment: Refuses to disclose how  much   Drug use: No    Comment: Refuses to answer   Sexual activity: Not Currently    Comment: refused to answer  Other Topics Concern   Not on file  Social History Narrative   Not on file   Social Determinants of Health   Financial Resource Strain: Not on file  Food Insecurity: No Food Insecurity (04/08/2022)   Hunger Vital Sign    Worried About Running Out of Food in the Last Year: Never true    Ran Out of Food in the Last Year: Never true  Transportation Needs: No Transportation Needs (04/08/2022)   PRAPARE - Hydrologist (Medical): No    Lack of Transportation (Non-Medical): No  Physical Activity: Not on file  Stress: Not on file  Social Connections: Not on file   Additional Social History: lives with her mother  Sleep: Fair  Appetite:  Fair  Current Medications: Current Facility-Administered Medications  Medication Dose Route Frequency Provider Last Rate Last Admin   acetaminophen (TYLENOL) tablet 650 mg  650 mg Oral Q6H PRN [name], [name], MD       alum & mag hydroxide-simeth (MAALOX/MYLANTA) 200-200-20 MG/5ML suspension 30 mL  30 mL Oral Q4H PRN [name], [name], MD       haloperidol (HALDOL) tablet 10 mg  10 mg Oral BID [name], [name], MD   10 mg at 05/21/22 0816   Or   haloperidol lactate (HALDOL) injection 5 mg  5 mg  Intramuscular BID [name], [name], MD       lithium carbonate capsule 600 mg  600 mg Oral QHS [name], [name], MD   600 mg at 05/20/22 2114   LORazepam (ATIVAN) tablet 1 mg  1 mg Oral BID [name], [name], MD   1 mg at 05/21/22 1610   Or   LORazepam (ATIVAN) injection 1 mg  1 mg Intramuscular BID [name], [name], MD       magnesium hydroxide (MILK OF MAGNESIA) suspension 30 mL  30 mL Oral Daily PRN [name], [name], MD       mirtazapine (REMERON) tablet 30 mg  30 mg Oral QHS [name], [name], MD   30 mg at 05/20/22 2115   multivitamin with minerals tablet 1 tablet  1 tablet Oral Daily [name], [name], MD   1 tablet at  05/21/22 0816   OLANZapine (ZYPREXA) tablet 30 mg  30 mg Oral QHS [name], [name], MD   30 mg at 05/20/22 2114   thiamine (VITAMIN B1) tablet 100 mg  100 mg Oral Daily [name], [name], MD   100 mg at 05/21/22 0815    Lab Results: No results found for this or any previous visit (from the past 48 hour(s)).  Blood Alcohol level:  Lab Results  Component Value Date   ETH <10 03/29/2022   ETH <10 96/10/5407    Metabolic Disorder Labs: Lab Results  Component Value Date   HGBA1C 5.6 10/21/2021   MPG 114 10/21/2021   MPG 111 10/02/2021   Lab Results  Component Value Date   PROLACTIN 13.6 04/16/2015   Lab Results  Component Value Date   CHOL 189 10/21/2021   TRIG 52 10/21/2021   HDL 50 10/21/2021   CHOLHDL 3.8 10/21/2021   VLDL 10 10/21/2021   LDLCALC 129 (H) 10/21/2021   LDLCALC 93 10/01/2020    Physical Findings: AIMS: Facial and Oral Movements Muscles of Facial Expression: None, normal Lips and Perioral Area: None, normal Jaw: None, normal Tongue: None, normal,Extremity Movements Upper (arms, wrists, hands, fingers): None, normal Lower (legs, knees, ankles, toes): None, normal, Trunk Movements Neck, shoulders, hips: None, normal, Overall Severity Severity of abnormal movements (highest score from questions above): None, normal Incapacitation due to abnormal movements: None, normal Patient's awareness of abnormal movements (rate only patient's report): No Awareness, Dental Status Current problems with teeth and/or dentures?: No Does patient usually wear dentures?: No  CIWA:    COWS:     Musculoskeletal: Strength & Muscle Tone: within normal limits Gait & Station: normal Patient leans: [name]  Psychiatric Specialty Exam: General Appearance: Casual  Eye Contact:  Minimal  Speech:  WDL  Volume:  Normal  Mood:  Irritable at times  Affect:  Non-Congruent  Thought Process delusional  Orientation:  other; name, place "hospital"  Thought Content:  logical at times   Suicidal Thoughts:  No  Homicidal Thoughts:  No  Memory:  Immediate;   Fair Recent;   Fair Remote;   Fair  Judgement:  Fair  Insight:  Lacking  Psychomotor Activity:  WDL  Concentration:  WDL  Recall:  [employer] of Knowledge:  Fair  Language:  Good  Akathisia:  NA  Handed:    AIMS (if indicated):     Assets:  Desire for Improvement  ADL's:  intact  Cognition:  Impaired,  Mild  Sleep:  Number of Hours: 8.5       Review of Systems  Constitutional: Negative.  HENT: Negative.    Eyes: Negative.   Respiratory: Negative.    Cardiovascular: Negative.   Gastrointestinal: Negative.   Musculoskeletal: Negative.   Skin: Negative.   Allergic/Immunologic: Negative.   Neurological: Negative.   Hematological: Negative.   Psychiatric/Behavioral:  The patient is nervous/anxious.     Blood pressure 107/60, pulse 62, temperature 98.6 F (37 C), temperature source Oral, resp. rate 18, height 5\' 4"  (1.626 m), weight 40.4 kg, SpO2 99 %.Body mass index is 15.28 kg/m.     Physical Exam: Physical Exam Vitals and nursing note reviewed.  Constitutional:      Appearance: Normal appearance.  HENT:     Head: Normocephalic.  Pulmonary:     Effort: Pulmonary effort is normal.  Musculoskeletal:        General: Normal range of motion.     Cervical back: Normal range of motion.  Neurological:     General: No focal deficit present.     Mental Status: She is alert. Mental status is at baseline.  Psychiatric:        Attention and Perception: Attention normal.        Mood and Affect: Affect is inappropriate.        Speech: Speech normal.        Behavior: Behavior is not aggressive. Behavior is cooperative.        Thought Content: Thought content is delusional.        Cognition and Memory: Cognition and memory normal.    Review of Systems  Constitutional: Negative.   HENT: Negative.    Eyes: Negative.   Respiratory: Negative.    Cardiovascular: Negative.   Gastrointestinal:  Negative.   Musculoskeletal: Negative.   Skin: Negative.   Neurological: Negative.   Psychiatric/Behavioral:  The patient is nervous/anxious.    Blood pressure 107/60, pulse 62, temperature 98.6 F (37 C), temperature source Oral, resp. rate 18, height 5\' 4"  (1.626 m), weight 40.4 kg, SpO2 99 %. Body mass index is 15.28 kg/m.   Treatment Plan Summary: Assessment: Schizoaffetive Disorder, depressive type: Haldol 10 mg BID Lithium 600 mg at bedtime Zyprexa 30 mg daily at bedtime  Insomnia Remeron 30 mg daily at bedtime  Anxiety; Ativan 1 mg BID  Plan: Transfer to [facility], NP 05/21/2022, 11:52 AM

## 2022-05-21 NOTE — BH IP Treatment Plan (Addendum)
Interdisciplinary Treatment and Diagnostic Plan Update  05/21/2022 Time of Session: 08:30 [name] MRN: [id number]  Principal Diagnosis: Schizoaffective disorder, depressive type (HCC)  Secondary Diagnoses: Principal Problem:   Schizoaffective disorder, depressive type (HCC)   Current Medications:  Current Facility-Administered Medications  Medication Dose Route Frequency Provider Last Rate Last Admin   acetaminophen (TYLENOL) tablet 650 mg  650 mg Oral Q6H PRN [name], [name], MD       alum & mag hydroxide-simeth (MAALOX/MYLANTA) 200-200-20 MG/5ML suspension 30 mL  30 mL Oral Q4H PRN [name], [name], MD       haloperidol (HALDOL) tablet 10 mg  10 mg Oral BID [name], [name], MD   10 mg at 05/21/22 9326   Or   haloperidol lactate (HALDOL) injection 5 mg  5 mg Intramuscular BID [name], [name], MD       lithium carbonate capsule 600 mg  600 mg Oral QHS [name], [name], MD   600 mg at 05/20/22 2114   LORazepam (ATIVAN) tablet 1 mg  1 mg Oral BID [name], [name], MD   1 mg at 05/21/22 7124   Or   LORazepam (ATIVAN) injection 1 mg  1 mg Intramuscular BID [name], [name], MD       magnesium hydroxide (MILK OF MAGNESIA) suspension 30 mL  30 mL Oral Daily PRN [name], [name], MD       mirtazapine (REMERON) tablet 30 mg  30 mg Oral QHS [name], [name], MD   30 mg at 05/20/22 2115   multivitamin with minerals tablet 1 tablet  1 tablet Oral Daily [name], [name], MD   1 tablet at 05/21/22 0816   OLANZapine (ZYPREXA) tablet 30 mg  30 mg Oral QHS [name], [name], MD   30 mg at 05/20/22 2114   thiamine (VITAMIN B1) tablet 100 mg  100 mg Oral Daily [name], [name], MD   100 mg at 05/21/22 0815   PTA Medications: Medications Prior to Admission  Medication Sig Dispense Refill Last Dose   clonazePAM (KLONOPIN) 0.5 MG tablet Take 0.5 tablets (0.25 mg total) by mouth 2 (two) times daily. (Patient not taking: Reported on 03/30/2022) 60 tablet 0    haloperidol (HALDOL) 5 MG tablet Take 1 tablet (5 mg  total) by mouth 2 (two) times daily. (Patient not taking: Reported on 03/30/2022) 60 tablet 3    midodrine (PROAMATINE) 2.5 MG tablet Take 1 tablet (2.5 mg total) by mouth 3 (three) times daily with meals. (Patient not taking: Reported on 03/30/2022)  0    mirtazapine (REMERON) 15 MG tablet Take 0.5 tablets (7.5 mg total) by mouth at bedtime. (Patient not taking: Reported on 03/30/2022) 30 tablet 2    Multiple Vitamin (MULTIVITAMIN WITH MINERALS) TABS tablet Take 1 tablet by mouth daily. (Patient not taking: Reported on 03/30/2022)      OLANZapine (ZYPREXA) 5 MG tablet Take 1 tablet (5 mg total) by mouth at bedtime. (Patient not taking: Reported on 03/30/2022) 30 tablet 2    thiamine 100 MG tablet Take 1 tablet (100 mg total) by mouth daily. (Patient not taking: Reported on 03/30/2022)       Patient Stressors: Marital or family conflict    Patient Strengths: Supportive family/friends   Treatment Modalities: Medication Management, Group therapy, Case management,  1 to 1 session with clinician, Psychoeducation, Recreational therapy.   Physician Treatment Plan for Primary Diagnosis: Schizoaffective disorder, depressive type (HCC) Long Term Goal(s): Improvement in symptoms so as ready for  discharge   Short Term Goals: Compliance with prescribed medications will improve Ability to verbalize feelings will improve Ability to demonstrate self-control will improve Ability to identify and develop effective coping behaviors will improve  Medication Management: Evaluate patient's response, side effects, and tolerance of medication regimen.  Therapeutic Interventions: 1 to 1 sessions, Unit Group sessions and Medication administration.  Evaluation of Outcomes: Progressing  Physician Treatment Plan for Secondary Diagnosis: Principal Problem:   Schizoaffective disorder, depressive type (HCC)  Long Term Goal(s): Improvement in symptoms so as ready for discharge   Short Term Goals: Compliance with  prescribed medications will improve Ability to verbalize feelings will improve Ability to demonstrate self-control will improve Ability to identify and develop effective coping behaviors will improve     Medication Management: Evaluate patient's response, side effects, and tolerance of medication regimen.  Therapeutic Interventions: 1 to 1 sessions, Unit Group sessions and Medication administration.  Evaluation of Outcomes: Progressing   RN Treatment Plan for Primary Diagnosis: Schizoaffective disorder, depressive type (HCC) Long Term Goal(s): Knowledge of disease and therapeutic regimen to maintain health will improve  Short Term Goals: Ability to remain free from injury will improve, Ability to verbalize frustration and anger appropriately will improve, Ability to demonstrate self-control, Ability to participate in decision making will improve, Ability to verbalize feelings will improve, Ability to disclose and discuss suicidal ideas, Ability to identify and develop effective coping behaviors will improve, and Compliance with prescribed medications will improve  Medication Management: RN will administer medications as ordered by provider, will assess and evaluate patient's response and provide education to patient for prescribed medication. RN will report any adverse and/or side effects to prescribing provider.  Therapeutic Interventions: 1 on 1 counseling sessions, Psychoeducation, Medication administration, Evaluate responses to treatment, Monitor vital signs and CBGs as ordered, Perform/monitor CIWA, COWS, AIMS and Fall Risk screenings as ordered, Perform wound care treatments as ordered.  Evaluation of Outcomes: Progressing   LCSW Treatment Plan for Primary Diagnosis: Schizoaffective disorder, depressive type (HCC) Long Term Goal(s): Safe transition to appropriate next level of care at discharge, Engage patient in therapeutic group addressing interpersonal concerns.  Short Term  Goals: Engage patient in aftercare planning with referrals and resources, Increase social support, Increase ability to appropriately verbalize feelings, Increase emotional regulation, Facilitate acceptance of mental health diagnosis and concerns, and Increase skills for wellness and recovery  Therapeutic Interventions: Assess for all discharge needs, 1 to 1 time with Social worker, Explore available resources and support systems, Assess for adequacy in community support network, Educate family and significant other(s) on suicide prevention, Complete Psychosocial Assessment, Interpersonal group therapy.  Evaluation of Outcomes: Progressing   Progress in Treatment: Attending groups: No. Participating in groups: No. Taking medication as prescribed: Yes. Toleration medication: Yes. Family/Significant other contact made: Yes, individual(s) contacted:  guardian/sister, [name] Meno-Lennon.  Patient understands diagnosis: Yes. Discussing patient identified problems/goals with staff: Yes. Medical problems stabilized or resolved: Yes. Denies suicidal/homicidal ideation: Yes. Issues/concerns per patient self-inventory: No. Other: none.  New problem(s) identified: No, Describe:  none identified. Update 04/15/22: No changes at this time. Update 04/20/22: No changes at this time. Update 04/25/22: No changes at this time. Update 05/01/22: No changes at this time. 05/05/22 Update: No changes at this time. 05/10/22 Update: No changes at this time. 05/15/22 Update: No changes at this time. 05/21/22 Update: No changes at this time.    New Short Term/Long Term Goal(s): elimination of symptoms of psychosis, medication management for mood stabilization; elimination of SI thoughts; development  of comprehensive mental wellness plan. Update 04/15/22: No changes at this time. Update 04/20/22: No changes at this time. Update 04/25/22: No changes at this time. Update 05/01/22: No changes at this time. 05/05/22 Update: No  changes at this time. 05/10/22 Update: No changes at this time. 05/15/22 Update: No changes at this time. 05/21/22 Update: No changes at this time.   Patient Goals:  "Nothing I want to work on. I want to be discharged." Update 04/15/22: No changes at this time. Update 04/20/22: No changes at this time. Update 04/25/22: No changes at this time. Update 05/01/22: No changes at this time. 05/05/22 Update: No changes at this time. 05/10/22 Update: No changes at this time. 05/15/22 Update: No changes at this time. 05/21/22 Update: No changes at this time.   Discharge Plan or Barriers: CSW will assist pt with development of an appropriate aftercare/discharge plan. Update 04/15/22: Pt has been unwilling to engage with the treatment team around discharge. Update 04/20/22: No changes at this time.   Update 04/25/22: Patient will be returning to live with her sister. Pt will receive outpatient services through [facility]. Update 05/01/22: No changes at this time. 05/05/22 Update: No changes at this time. 05/10/22 Update: will return to her [facility] team. 05/15/22 Update: No changes at this time. 05/21/22 Update: No changes at this time.   Reason for Continuation of Hospitalization: Aggression Medication stabilization   Estimated Length of Stay: 1-7 days Update 04/15/22: No changes at this time. Update 04/20/22: TBD Update 04/25/22: TBD Update 05/01/22: No changes at this time. 05/05/22 Update: No changes at this time. 05/10/22 Update: No changes at this time. 05/15/22 Update: No changes at this time. 05/21/22 Update: No changes at this time.  Last 3 [place] Suicide Severity Risk Score: Flowsheet Row Admission (Current) from 04/08/2022 in [place] Admission (Discharged) from 10/17/2021 in [place] Admission (Discharged) from 10/02/2021 in [facility]  C-SSRS RISK CATEGORY No Risk No Risk No Risk       Last PHQ 2/9 Scores:     No data to display           Scribe for Treatment Team: Shirl Harris, LCSW 05/21/2022 8:51 AM

## 2022-05-21 NOTE — Progress Notes (Signed)
Pt denies SI/HI/AVH and verbally agrees to approach staff if these become apparent or before harming themselves/others. Rates depression 0/10. Rates anxiety 0/10. Rates pain 0/10. Pt has been in her room for the rest of the day. Scheduled medications administered to pt, per MD orders. RN provided support and encouragement to pt. Q15 min safety checks implemented and continued. Pt safe on the unit. RN will continue to monitor and intervene as needed. Problem: Coping: Goal: Level of anxiety will decrease Outcome: Progressing   Problem: Activity: Goal: Will verbalize the importance of balancing activity with adequate rest periods Outcome: Progressing    05/21/22 0816  Psych Admission Type (Psych Patients Only)  Admission Status Involuntary  Psychosocial Assessment  Patient Complaints None  Eye Contact Fair  Facial Expression Flat  Affect Blunted  Speech Logical/coherent  Interaction Isolative  Motor Activity Slow  Appearance/Hygiene Unremarkable;In scrubs  Behavior Characteristics Cooperative;Appropriate to situation;Calm  Mood Pleasant  Thought Process  Coherency WDL  Content WDL  Delusions None reported or observed  Perception WDL  Hallucination None reported or observed  Judgment WDL  Confusion None  Danger to Self  Current suicidal ideation? Denies  Danger to Others  Danger to Others None reported or observed

## 2022-05-21 NOTE — [facility] Counselor (Signed)
CSW contacted with guardian/sister, [name] Brennan-Lennon [phone]). Roussell-Lennon stated that she went over to the [place] and was told to give them a couple of days. She stated that they needed to figure out with the provider whether they would let pt become a pt there. She went on to share that they said that an interview would need to be completed with pt which could be virtual. CSW stated that pt could be assisted with this, if she is willing to participate. Rueckert-Lennon was given CSW contact information to follow up when she hears from the [place]. No other concerns expressed. Contact ended without incident.   Chalmers Guest. Guerry Bruin, MSW, LCSW, [place] 05/21/2022 10:13 AM

## 2022-05-22 NOTE — Progress Notes (Signed)
Recreation Therapy Notes    Date: 05/22/2022  Time: 10:35 am     Location: Craft room   Behavioral response: [name]   Intervention Topic: Self-care   Discussion/Intervention: Patient refused to attend group.   Clinical Observations/Feedback:  Patient refused to attend group.    Dewane Timson LRT/CTRS        Jorrell Kuster 05/22/2022 12:34 PM

## 2022-05-22 NOTE — Progress Notes (Signed)
Patient is quiet and reserved.  She remains isolative to her room.  She denies having any psychiatric symptoms at this encounter.  She is med compliant. Takes meds without issue. Safe on the unit with q15 minute safety checks.    C Butler-Nicholson, LPN

## 2022-05-22 NOTE — Progress Notes (Signed)
Pt denies SI/HI/AVH and verbally agrees to approach staff if these become apparent or before harming themselves/others. Rates depression 0/10. Rates anxiety 0/10. Rates pain 0/10. Pt isolates to her room. Scheduled medications administered to pt, per MD orders. RN provided support and encouragement to pt. Q15 min safety checks implemented and continued. Pt safe on the unit. RN will continue to monitor and intervene as needed. Problem: Nutrition: Goal: Adequate nutrition will be maintained Outcome: Progressing   Problem: Elimination: Goal: Will not experience complications related to bowel motility Outcome: Progressing   05/22/22 0813  Psych Admission Type (Psych Patients Only)  Admission Status Involuntary  Psychosocial Assessment  Patient Complaints None  Eye Contact Fair  Facial Expression Flat  Affect Blunted  Speech Logical/coherent  Interaction Isolative  Motor Activity Slow  Appearance/Hygiene Unremarkable;In scrubs  Behavior Characteristics Cooperative;Appropriate to situation;Calm  Mood Pleasant  Thought Process  Coherency WDL  Content WDL  Delusions None reported or observed  Perception WDL  Hallucination None reported or observed  Judgment WDL  Confusion None  Danger to Self  Current suicidal ideation? Denies  Danger to Others  Danger to Others None reported or observed

## 2022-05-22 NOTE — Group Note (Signed)
[facility] LCSW Group Therapy Note   Group Date: 05/22/2022 Start Time: 1315 End Time: 1415  Type of Therapy and Topic:  Group Therapy:  Feelings around Relapse and Recovery  Participation Level:  Did Not Attend    Description of Group:    Patients in this group will discuss emotions they experience before and after a relapse. They will process how experiencing these feelings, or avoidance of experiencing them, relates to having a relapse. Facilitator will guide patients to explore emotions they have related to recovery. Patients will be encouraged to process which emotions are more powerful. They will be guided to discuss the emotional reaction significant others in their lives may have to patients' relapse or recovery. Patients will be assisted in exploring ways to respond to the emotions of others without this contributing to a relapse.  Therapeutic Goals: Patient will identify two or more emotions that lead to relapse for them:  Patient will identify two emotions that result when they relapse:  Patient will identify two emotions related to recovery:  Patient will demonstrate ability to communicate their needs through discussion and/or role plays.   Summary of Patient Progress: X   Therapeutic Modalities:   Cognitive Behavioral Therapy Solution-Focused Therapy Assertiveness Training Relapse Prevention Therapy   Laquisha Northcraft R Chirstina Haan, LCSW 

## 2022-05-22 NOTE — Plan of Care (Signed)
  Problem: Education: Goal: Knowledge of General Education information will improve Description: Including pain rating scale, medication(s)/side effects and non-pharmacologic comfort measures Outcome: Progressing   Problem: Health Behavior/Discharge Planning: Goal: Ability to manage health-related needs will improve Outcome: Progressing   Problem: Clinical Measurements: Goal: Ability to maintain clinical measurements within normal limits will improve Outcome: Progressing Goal: Will remain free from infection Outcome: Progressing Goal: Diagnostic test results will improve Outcome: Progressing Goal: Respiratory complications will improve Outcome: Progressing Goal: Cardiovascular complication will be avoided Outcome: Progressing   Problem: Safety: Goal: Periods of time without injury will increase Outcome: Progressing   Problem: Physical Regulation: Goal: Ability to maintain clinical measurements within normal limits will improve Outcome: Progressing   Problem: Health Behavior/Discharge Planning: Goal: Identification of resources available to assist in meeting health care needs will improve Outcome: Progressing Goal: Compliance with treatment plan for underlying cause of condition will improve Outcome: Progressing   

## 2022-05-22 NOTE — [facility] Group Notes (Signed)
[facility] Group Notes:  (Nursing/MHT/Case Management/Adjunct)  Date:  05/22/2022  Time:  10:22 AM  Type of Therapy:   Community Meeting  Participation Level:  Did Not Attend   [name] Travis Thoma Paulsen 05/22/2022, 10:22 AM 

## 2022-05-22 NOTE — [facility] Group Notes (Signed)
[facility] Group Notes:  (Nursing/MHT/Case Management/Adjunct)  Date:  05/22/2022  Time:  8:39 PM  Type of Therapy:   Wrap up  Participation Level:  Active  Participation Quality:  Appropriate  Affect:  Appropriate  Cognitive:  Alert  Insight:  Good  Engagement in Group:  Want to be discharge and speech was not clear.  Modes of Intervention:  Support  Summary of Progress/Problems:  [name] 05/22/2022, 8:39 PM

## 2022-05-23 DIAGNOSIS — F251 Schizoaffective disorder, depressive type: Secondary | ICD-10-CM | POA: Diagnosis not present

## 2022-05-23 NOTE — Plan of Care (Signed)
Pt is calm and cooperative, compliant with medications, endorses good sleep and good appetite, good concentration, normal energy, denies depression, hopelessnesss and anxiety.  Denies SI HI AVH.  Denies pain or physical distress.  Goal for today is "discharge"   Continued monitoring via q 15 minute observations.

## 2022-05-23 NOTE — Progress Notes (Signed)
[facility] MD Progress Note  05/23/2022 1:42 PM [name]  MRN:  [id number] Subjective: Follow-up 61 year old woman with schizoaffective disorder or schizophrenia.  Patient is clinically unchanged.  Continues to remain mostly isolated.  Eats enough to stay healthy but otherwise little interaction.  On interview once again became very agitated as soon as I brought up discharge planning and its relationship to her family started making delusional statements about her family being nonhuman life forms.  This despite now being on 2 antipsychotics and lithium Principal Problem: Schizoaffective disorder, depressive type ([place]) Diagnosis: Principal Problem:   Schizoaffective disorder, depressive type ([place])  Total Time spent with patient: 30 minutes  Past Psychiatric History: Past history of longstanding psychotic disorder  Past Medical History:  Past Medical History:  Diagnosis Date   Anemia 10/02/2021   Non compliance w medication regimen    Schizophrenia [facility])     Past Surgical History:  Procedure Laterality Date   BIOPSY  09/25/2021   Procedure: BIOPSY;  Surgeon: Milus Banister, MD;  Location: [facility];  Service: Gastroenterology;;   ESOPHAGOGASTRODUODENOSCOPY (EGD) WITH PROPOFOL [name] 09/25/2021   Procedure: ESOPHAGOGASTRODUODENOSCOPY (EGD) WITH PROPOFOL;  Surgeon: Milus Banister, MD;  Location: [facility];  Service: Gastroenterology;  Laterality: [name];  With NGT placement   Family History: History reviewed. No pertinent family history. Family Psychiatric  History: No change Social History:  Social History   Substance and Sexual Activity  Alcohol Use Not Currently   Comment: Refuses to disclose how much     Social History   Substance and Sexual Activity  Drug Use No   Comment: Refuses to answer    Social History   Socioeconomic History   Marital status: Single    Spouse name: Not on file   Number of children: Not on file   Years of education: Not on file   Highest education  level: Not on file  Occupational History   Not on file  Tobacco Use   Smoking status: Every Day    Packs/day: 2.00    Types: Cigarettes   Smokeless tobacco: Never  Substance and Sexual Activity   Alcohol use: Not Currently    Comment: Refuses to disclose how much   Drug use: No    Comment: Refuses to answer   Sexual activity: Not Currently    Comment: refused to answer  Other Topics Concern   Not on file  Social History Narrative   Not on file   Social Determinants of Health   Financial Resource Strain: Not on file  Food Insecurity: No Food Insecurity (04/08/2022)   Hunger Vital Sign    Worried About Running Out of Food in the Last Year: Never true    Ran Out of Food in the Last Year: Never true  Transportation Needs: No Transportation Needs (04/08/2022)   PRAPARE - Hydrologist (Medical): No    Lack of Transportation (Non-Medical): No  Physical Activity: Not on file  Stress: Not on file  Social Connections: Not on file   Additional Social History:                         Sleep: Fair  Appetite:  Fair  Current Medications: Current Facility-Administered Medications  Medication Dose Route Frequency Provider Last Rate Last Admin   acetaminophen (TYLENOL) tablet 650 mg  650 mg Oral Q6H PRN Justun Anaya, [name], MD       alum & mag hydroxide-simeth (MAALOX/MYLANTA) 200-200-20  MG/5ML suspension 30 mL  30 mL Oral Q4H PRN Shawntell Dixson T, MD       haloperidol (HALDOL) tablet 10 mg  10 mg Oral BID Adriane Guglielmo, [name], MD   10 mg at 05/23/22 3382   Or   haloperidol lactate (HALDOL) injection 5 mg  5 mg Intramuscular BID Cyris Maalouf T, MD       lithium carbonate capsule 600 mg  600 mg Oral QHS Tobby Fawcett T, MD   600 mg at 05/22/22 2111   LORazepam (ATIVAN) tablet 1 mg  1 mg Oral BID Dian Minahan T, MD   1 mg at 05/23/22 5053   Or   LORazepam (ATIVAN) injection 1 mg  1 mg Intramuscular BID Candance Bohlman T, MD       magnesium hydroxide (MILK OF  MAGNESIA) suspension 30 mL  30 mL Oral Daily PRN Regine Christian T, MD       mirtazapine (REMERON) tablet 30 mg  30 mg Oral QHS Janashia Parco T, MD   30 mg at 05/22/22 2110   multivitamin with minerals tablet 1 tablet  1 tablet Oral Daily Judi Jaffe, [name], MD   1 tablet at 05/23/22 0824   OLANZapine (ZYPREXA) tablet 30 mg  30 mg Oral QHS Aleenah Homen T, MD   30 mg at 05/22/22 2110   thiamine (VITAMIN B1) tablet 100 mg  100 mg Oral Daily Custer Pimenta, [name], MD   100 mg at 05/23/22 9767    Lab Results: No results found for this or any previous visit (from the past 48 hour(s)).  Blood Alcohol level:  Lab Results  Component Value Date   ETH <10 03/29/2022   ETH <10 09/12/2021    Metabolic Disorder Labs: Lab Results  Component Value Date   HGBA1C 5.6 10/21/2021   MPG 114 10/21/2021   MPG 111 10/02/2021   Lab Results  Component Value Date   PROLACTIN 13.6 04/16/2015   Lab Results  Component Value Date   CHOL 189 10/21/2021   TRIG 52 10/21/2021   HDL 50 10/21/2021   CHOLHDL 3.8 10/21/2021   VLDL 10 10/21/2021   LDLCALC 129 (H) 10/21/2021   LDLCALC 93 10/01/2020    Physical Findings: AIMS: Facial and Oral Movements Muscles of Facial Expression: None, normal Lips and Perioral Area: None, normal Jaw: None, normal Tongue: None, normal,Extremity Movements Upper (arms, wrists, hands, fingers): None, normal Lower (legs, knees, ankles, toes): None, normal, Trunk Movements Neck, shoulders, hips: None, normal, Overall Severity Severity of abnormal movements (highest score from questions above): None, normal Incapacitation due to abnormal movements: None, normal Patient's awareness of abnormal movements (rate only patient's report): No Awareness, Dental Status Current problems with teeth and/or dentures?: No Does patient usually wear dentures?: No  CIWA:    COWS:     Musculoskeletal: Strength & Muscle Tone: within normal limits Gait & Station: normal Patient leans:  [name]  Psychiatric Specialty Exam:  Presentation  General Appearance:  Disheveled  Eye Contact: Fair  Speech: Pressured  Speech Volume: Increased  Handedness: Right   Mood and Affect  Mood: Irritable; Labile; Angry  Affect: Blunt; Labile   Thought Process  Thought Processes: Disorganized; Irrevelant  Descriptions of Associations:Loose  Orientation:Partial  Thought Content:Illogical; Rumination; Scattered; Tangential; Delusions  History of Schizophrenia/Schizoaffective disorder:Yes  Duration of Psychotic Symptoms:Greater than six months  Hallucinations:No data recorded Ideas of Reference:Percusatory; Paranoia  Suicidal Thoughts:No data recorded Homicidal Thoughts:No data recorded  Sensorium  Memory: Immediate Fair; Recent Fair; Remote Fair  Judgment: Impaired  Insight: Poor; Lacking   Executive Functions  Concentration: Poor  Attention Span: Poor  Recall: Poor  Fund of Knowledge: Poor  Language: Poor   Psychomotor Activity  Psychomotor Activity:No data recorded  Assets  Assets: Physical Health; Resilience; Housing; Social Support   Sleep  Sleep:No data recorded   Physical Exam: Physical Exam Vitals and nursing note reviewed.  Constitutional:      Appearance: Normal appearance.  HENT:     Head: Normocephalic and atraumatic.     Mouth/Throat:     Pharynx: Oropharynx is clear.  Eyes:     Pupils: Pupils are equal, round, and reactive to light.  Cardiovascular:     Rate and Rhythm: Normal rate and regular rhythm.  Pulmonary:     Effort: Pulmonary effort is normal.     Breath sounds: Normal breath sounds.  Abdominal:     General: Abdomen is flat.     Palpations: Abdomen is soft.  Musculoskeletal:        General: Normal range of motion.  Skin:    General: Skin is warm and dry.  Neurological:     General: No focal deficit present.     Mental Status: She is alert. Mental status is at baseline.  Psychiatric:         Attention and Perception: She is inattentive.        Mood and Affect: Mood normal. Affect is blunt and inappropriate.        Speech: Speech is tangential.        Behavior: Behavior is agitated. Behavior is not aggressive.        Thought Content: Thought content is paranoid and delusional.        Cognition and Memory: Cognition is impaired.        Judgment: Judgment is inappropriate.    Review of Systems  Constitutional: Negative.   HENT: Negative.    Eyes: Negative.   Respiratory: Negative.    Cardiovascular: Negative.   Gastrointestinal: Negative.   Musculoskeletal: Negative.   Skin: Negative.   Neurological: Negative.   Psychiatric/Behavioral:  The patient is nervous/anxious.    Blood pressure 101/70, pulse 61, temperature 97.9 F (36.6 C), temperature source Oral, resp. rate 18, height 5\' 4"  (1.626 m), weight 40.4 kg, SpO2 100 %. Body mass index is 15.28 kg/m.   Treatment Plan Summary: Plan check lithium level Monday.  Continue on medication.  Not sure if anything is going to make a difference.  Remains on dischargeable to her family currently.  Monday, MD 05/23/2022, 1:42 PM

## 2022-05-24 DIAGNOSIS — F251 Schizoaffective disorder, depressive type: Secondary | ICD-10-CM | POA: Diagnosis not present

## 2022-05-24 NOTE — Progress Notes (Signed)
[facility] MD Progress Note  05/24/2022 10:36 AM [name]  MRN:  [id number] Subjective: Follow-up 61 year old woman with schizoaffective disorder.  Patient continues to spend most of her time pacing around her room.  Little interaction on the unit.  She is eating adequately and has no physical complaints.  Continues to be paranoid and psychotic with complaints about her family being alien life forms and that sort of thing.  Continues to have no ability to discuss appropriate discharge plans Principal Problem: Schizoaffective disorder, depressive type ([facility]) Diagnosis: Principal Problem:   Schizoaffective disorder, depressive type ([facility])  Total Time spent with patient: 30 minutes  Past Psychiatric History: Long history of schizoaffective disorder with decline recently  Past Medical History:  Past Medical History:  Diagnosis Date   Anemia 10/02/2021   Non compliance w medication regimen    Schizophrenia ([facility])     Past Surgical History:  Procedure Laterality Date   BIOPSY  09/25/2021   Procedure: BIOPSY;  Surgeon: Rachael Fee, MD;  Location: [facility];  Service: Gastroenterology;;   ESOPHAGOGASTRODUODENOSCOPY (EGD) WITH PROPOFOL [name] 09/25/2021   Procedure: ESOPHAGOGASTRODUODENOSCOPY (EGD) WITH PROPOFOL;  Surgeon: Rachael Fee, MD;  Location: [facility];  Service: Gastroenterology;  Laterality: [name];  With NGT placement   Family History: History reviewed. No pertinent family history. Family Psychiatric  History: See previous Social History:  Social History   Substance and Sexual Activity  Alcohol Use Not Currently   Comment: Refuses to disclose how much     Social History   Substance and Sexual Activity  Drug Use No   Comment: Refuses to answer    Social History   Socioeconomic History   Marital status: Single    Spouse name: Not on file   Number of children: Not on file   Years of education: Not on file   Highest education level: Not on file  Occupational History    Not on file  Tobacco Use   Smoking status: Every Day    Packs/day: 2.00    Types: Cigarettes   Smokeless tobacco: Never  Substance and Sexual Activity   Alcohol use: Not Currently    Comment: Refuses to disclose how much   Drug use: No    Comment: Refuses to answer   Sexual activity: Not Currently    Comment: refused to answer  Other Topics Concern   Not on file  Social History Narrative   Not on file   Social Determinants of Health   Financial Resource Strain: Not on file  Food Insecurity: No Food Insecurity (04/08/2022)   Hunger Vital Sign    Worried About Running Out of Food in the Last Year: Never true    Ran Out of Food in the Last Year: Never true  Transportation Needs: No Transportation Needs (04/08/2022)   PRAPARE - Administrator, Civil Service (Medical): No    Lack of Transportation (Non-Medical): No  Physical Activity: Not on file  Stress: Not on file  Social Connections: Not on file   Additional Social History:                         Sleep: Fair  Appetite:  Fair  Current Medications: Current Facility-Administered Medications  Medication Dose Route Frequency Provider Last Rate Last Admin   acetaminophen (TYLENOL) tablet 650 mg  650 mg Oral Q6H PRN Dahlia Nifong, [name], MD       alum & mag hydroxide-simeth (MAALOX/MYLANTA) 200-200-20 MG/5ML suspension 30  mL  30 mL Oral Q4H PRN Carnella Fryman T, MD       haloperidol (HALDOL) tablet 10 mg  10 mg Oral BID Jakobie Henslee T, MD   10 mg at 05/24/22 0086   Or   haloperidol lactate (HALDOL) injection 5 mg  5 mg Intramuscular BID Jamaris Theard T, MD       lithium carbonate capsule 600 mg  600 mg Oral QHS Noelle Sease T, MD   600 mg at 05/23/22 2100   LORazepam (ATIVAN) tablet 1 mg  1 mg Oral BID Adaiah Morken T, MD   1 mg at 05/24/22 7619   Or   LORazepam (ATIVAN) injection 1 mg  1 mg Intramuscular BID Geraline Halberstadt T, MD       magnesium hydroxide (MILK OF MAGNESIA) suspension 30 mL  30 mL Oral Daily  PRN Jniya Madara T, MD       mirtazapine (REMERON) tablet 30 mg  30 mg Oral QHS Cristol Engdahl T, MD   30 mg at 05/23/22 2100   multivitamin with minerals tablet 1 tablet  1 tablet Oral Daily Naijah Lacek, [name], MD   1 tablet at 05/24/22 0759   OLANZapine (ZYPREXA) tablet 30 mg  30 mg Oral QHS Jasraj Lappe T, MD   30 mg at 05/23/22 2100   thiamine (VITAMIN B1) tablet 100 mg  100 mg Oral Daily Rachid Parham, [name], MD   100 mg at 05/24/22 0759    Lab Results: No results found for this or any previous visit (from the past 48 hour(s)).  Blood Alcohol level:  Lab Results  Component Value Date   ETH <10 03/29/2022   ETH <10 50/93/2671    Metabolic Disorder Labs: Lab Results  Component Value Date   HGBA1C 5.6 10/21/2021   MPG 114 10/21/2021   MPG 111 10/02/2021   Lab Results  Component Value Date   PROLACTIN 13.6 04/16/2015   Lab Results  Component Value Date   CHOL 189 10/21/2021   TRIG 52 10/21/2021   HDL 50 10/21/2021   CHOLHDL 3.8 10/21/2021   VLDL 10 10/21/2021   LDLCALC 129 (H) 10/21/2021   LDLCALC 93 10/01/2020    Physical Findings: AIMS: Facial and Oral Movements Muscles of Facial Expression: None, normal Lips and Perioral Area: None, normal Jaw: None, normal Tongue: None, normal,Extremity Movements Upper (arms, wrists, hands, fingers): None, normal Lower (legs, knees, ankles, toes): None, normal, Trunk Movements Neck, shoulders, hips: None, normal, Overall Severity Severity of abnormal movements (highest score from questions above): None, normal Incapacitation due to abnormal movements: None, normal Patient's awareness of abnormal movements (rate only patient's report): No Awareness, Dental Status Current problems with teeth and/or dentures?: No Does patient usually wear dentures?: No  CIWA:    COWS:     Musculoskeletal: Strength & Muscle Tone: within normal limits Gait & Station: normal Patient leans: [name]  Psychiatric Specialty Exam:  Presentation  General  Appearance:  Disheveled  Eye Contact: Fair  Speech: Pressured  Speech Volume: Increased  Handedness: Right   Mood and Affect  Mood: Irritable; Labile; Angry  Affect: Blunt; Labile   Thought Process  Thought Processes: Disorganized; Irrevelant  Descriptions of Associations:Loose  Orientation:Partial  Thought Content:Illogical; Rumination; Scattered; Tangential; Delusions  History of Schizophrenia/Schizoaffective disorder:Yes  Duration of Psychotic Symptoms:Greater than six months  Hallucinations:No data recorded Ideas of Reference:Percusatory; Paranoia  Suicidal Thoughts:No data recorded Homicidal Thoughts:No data recorded  Sensorium  Memory: Immediate Fair; Recent Fair; Remote Fair  Judgment: Impaired  Insight: Poor; Lacking   Executive Functions  Concentration: Poor  Attention Span: Poor  Recall: Poor  Fund of Knowledge: Poor  Language: Poor   Psychomotor Activity  Psychomotor Activity:No data recorded  Assets  Assets: Physical Health; Resilience; Housing; Social Support   Sleep  Sleep:No data recorded   Physical Exam: Physical Exam Constitutional:      Appearance: Normal appearance.  HENT:     Head: Normocephalic and atraumatic.     Mouth/Throat:     Pharynx: Oropharynx is clear.  Eyes:     Pupils: Pupils are equal, round, and reactive to light.  Cardiovascular:     Rate and Rhythm: Normal rate and regular rhythm.  Pulmonary:     Effort: Pulmonary effort is normal.     Breath sounds: Normal breath sounds.  Abdominal:     General: Abdomen is flat.     Palpations: Abdomen is soft.  Musculoskeletal:        General: Normal range of motion.  Skin:    General: Skin is warm and dry.  Neurological:     General: No focal deficit present.     Mental Status: She is alert. Mental status is at baseline.  Psychiatric:        Attention and Perception: Attention normal.        Mood and Affect: Mood normal. Affect is  blunt.        Speech: Speech is tangential.        Behavior: Behavior is agitated. Behavior is not aggressive.        Thought Content: Thought content is paranoid and delusional.        Cognition and Memory: Cognition is impaired.        Judgment: Judgment is inappropriate.    Review of Systems  Constitutional: Negative.   HENT: Negative.    Eyes: Negative.   Respiratory: Negative.    Cardiovascular: Negative.   Gastrointestinal: Negative.   Musculoskeletal: Negative.   Skin: Negative.   Neurological: Negative.   Psychiatric/Behavioral:  The patient is nervous/anxious.    Blood pressure (!) 112/59, pulse (!) 57, temperature 98 F (36.7 C), temperature source Oral, resp. rate 18, height 5\' 4"  (1.626 m), weight 40.4 kg, SpO2 100 %. Body mass index is 15.28 kg/m.   Treatment Plan Summary: Medication management and Plan patient continues to be paranoid and psychotic and unable to engage in appropriate discussion or planning due to psychosis.  Lithium level will be checked within the next day.  , MD 05/24/2022, 10:36 AM

## 2022-05-24 NOTE — [facility] Group Notes (Signed)
[facility] Group Notes: (Clinical Social Work)   05/24/2022      Type of Therapy:  Group Therapy   Participation Level:  Did Not Attend despite MHT prompting   Bernadene Garside N Janvi Ammar, LCSW  05/24/2022 11:28 AM   

## 2022-05-24 NOTE — Progress Notes (Signed)
Patient condition unchanged , she is receptive to staff and denies SI/HI/AVH, affect is bright upon approach. Patient was offered emotional support, 15 minutes safety checks maintained.

## 2022-05-24 NOTE — Plan of Care (Signed)
Pt denies SI HI AVH.  Pt denies anxiety, depression and hoplessness.  Pt is calm and cooperative and compliant with all medications.  Continued monitoring via q15 minute observations.

## 2022-05-25 DIAGNOSIS — F251 Schizoaffective disorder, depressive type: Secondary | ICD-10-CM | POA: Diagnosis not present

## 2022-05-25 LAB — LITHIUM LEVEL: Lithium Lvl: 0.58 mmol/L — ABNORMAL LOW (ref 0.60–1.20)

## 2022-05-25 MED ORDER — QUETIAPINE FUMARATE 100 MG PO TABS
100.0000 mg | ORAL_TABLET | Freq: Every day | ORAL | Status: DC
  Administered 2022-05-25: 100 mg via ORAL
  Filled 2022-05-25: qty 1

## 2022-05-25 NOTE — Progress Notes (Signed)
[facility] MD Progress Note  05/25/2022 2:44 PM [name]  MRN:  [id number] Subjective: Follow-up for this 61 year old woman with schizophrenia or schizoaffective disorder.  Patient remains unchanged.  She spends most of her time in her room and when I come to talk with her she gets irritable quickly talking about how she is "working on matters with the government" in her mind.  Becomes very disorganized unable to answer questions and just gets irate but not physically aggressive.  Lithium level came back 0.58 suggesting that she really is taking it. Principal Problem: Schizoaffective disorder, depressive type (HCC) Diagnosis: Principal Problem:   Schizoaffective disorder, depressive type (HCC)  Total Time spent with patient: 30 minutes  Past Psychiatric History: Past history of schizophrenia  Past Medical History:  Past Medical History:  Diagnosis Date   Anemia 10/02/2021   Non compliance w medication regimen    Schizophrenia [facility])     Past Surgical History:  Procedure Laterality Date   BIOPSY  09/25/2021   Procedure: BIOPSY;  Surgeon: Rachael Fee, MD;  Location: [facility];  Service: Gastroenterology;;   ESOPHAGOGASTRODUODENOSCOPY (EGD) WITH PROPOFOL [name] 09/25/2021   Procedure: ESOPHAGOGASTRODUODENOSCOPY (EGD) WITH PROPOFOL;  Surgeon: Rachael Fee, MD;  Location: [facility];  Service: Gastroenterology;  Laterality: [name];  With NGT placement   Family History: History reviewed. No pertinent family history. Family Psychiatric  History: See previous Social History:  Social History   Substance and Sexual Activity  Alcohol Use Not Currently   Comment: Refuses to disclose how much     Social History   Substance and Sexual Activity  Drug Use No   Comment: Refuses to answer    Social History   Socioeconomic History   Marital status: Single    Spouse name: Not on file   Number of children: Not on file   Years of education: Not on file   Highest education level: Not on file   Occupational History   Not on file  Tobacco Use   Smoking status: Every Day    Packs/day: 2.00    Types: Cigarettes   Smokeless tobacco: Never  Substance and Sexual Activity   Alcohol use: Not Currently    Comment: Refuses to disclose how much   Drug use: No    Comment: Refuses to answer   Sexual activity: Not Currently    Comment: refused to answer  Other Topics Concern   Not on file  Social History Narrative   Not on file   Social Determinants of Health   Financial Resource Strain: Not on file  Food Insecurity: No Food Insecurity (04/08/2022)   Hunger Vital Sign    Worried About Running Out of Food in the Last Year: Never true    Ran Out of Food in the Last Year: Never true  Transportation Needs: No Transportation Needs (04/08/2022)   PRAPARE - Administrator, Civil Service (Medical): No    Lack of Transportation (Non-Medical): No  Physical Activity: Not on file  Stress: Not on file  Social Connections: Not on file   Additional Social History:                         Sleep: Fair  Appetite:  Fair  Current Medications: Current Facility-Administered Medications  Medication Dose Route Frequency Provider Last Rate Last Admin   acetaminophen (TYLENOL) tablet 650 mg  650 mg Oral Q6H PRN Caston Coopersmith, [name], MD       alum &  mag hydroxide-simeth (MAALOX/MYLANTA) 200-200-20 MG/5ML suspension 30 mL  30 mL Oral Q4H PRN Markeith Jue, [name], MD       haloperidol (HALDOL) tablet 10 mg  10 mg Oral BID Cahlil Sattar, [name], MD   10 mg at 05/25/22 6073   Or   haloperidol lactate (HALDOL) injection 5 mg  5 mg Intramuscular BID Guiselle Mian, [name], MD       lithium carbonate capsule 600 mg  600 mg Oral QHS Charnese Federici, [name], MD   600 mg at 05/24/22 2123   LORazepam (ATIVAN) tablet 1 mg  1 mg Oral BID Stellarose Cerny, [name], MD   1 mg at 05/25/22 7106   Or   LORazepam (ATIVAN) injection 1 mg  1 mg Intramuscular BID Ketrina Boateng, [name], MD       magnesium hydroxide (MILK OF MAGNESIA)  suspension 30 mL  30 mL Oral Daily PRN Glada Wickstrom, [name], MD       mirtazapine (REMERON) tablet 30 mg  30 mg Oral QHS Nalleli Largent T, MD   30 mg at 05/24/22 2124   multivitamin with minerals tablet 1 tablet  1 tablet Oral Daily Graves Nipp, [name], MD   1 tablet at 05/25/22 0755   QUEtiapine (SEROQUEL) tablet 100 mg  100 mg Oral QHS Bowdy Bair, [name], MD       thiamine (VITAMIN B1) tablet 100 mg  100 mg Oral Daily Samer Dutton, [name], MD   100 mg at 05/25/22 2694    Lab Results:  Results for orders placed or performed during the hospital encounter of 04/08/22 (from the past 48 hour(s))  Lithium level     Status: Abnormal   Collection Time: 05/25/22  9:06 AM  Result Value Ref Range   Lithium Lvl 0.58 (L) 0.60 - 1.20 mmol/L    Comment: Performed at [facility], [address] [place], [place] [address]    Blood Alcohol level:  Lab Results  Component Value Date   [facility] <10 03/29/2022   ETH <10 09/12/2021    Metabolic Disorder Labs: Lab Results  Component Value Date   HGBA1C 5.6 10/21/2021   MPG 114 10/21/2021   MPG 111 10/02/2021   Lab Results  Component Value Date   PROLACTIN 13.6 04/16/2015   Lab Results  Component Value Date   CHOL 189 10/21/2021   TRIG 52 10/21/2021   HDL 50 10/21/2021   CHOLHDL 3.8 10/21/2021   VLDL 10 10/21/2021   LDLCALC 129 (H) 10/21/2021   LDLCALC 93 10/01/2020    Physical Findings: AIMS: Facial and Oral Movements Muscles of Facial Expression: None, normal Lips and Perioral Area: None, normal Jaw: None, normal Tongue: None, normal,Extremity Movements Upper (arms, wrists, hands, fingers): None, normal Lower (legs, knees, ankles, toes): None, normal, Trunk Movements Neck, shoulders, hips: None, normal, Overall Severity Severity of abnormal movements (highest score from questions above): None, normal Incapacitation due to abnormal movements: None, normal Patient's awareness of abnormal movements (rate only patient's report): No Awareness,  Dental Status Current problems with teeth and/or dentures?: No Does patient usually wear dentures?: No  CIWA:    COWS:     Musculoskeletal: Strength & Muscle Tone: within normal limits Gait & Station: normal Patient leans: [name]  Psychiatric Specialty Exam:  Presentation  General Appearance:  Disheveled  Eye Contact: Fair  Speech: Pressured  Speech Volume: Increased  Handedness: Right   Mood and Affect  Mood: Irritable; Labile; Angry  Affect: Blunt; Labile   Thought Process  Thought Processes:  Disorganized; Irrevelant  Descriptions of Associations:Loose  Orientation:Partial  Thought Content:Illogical; Rumination; Scattered; Tangential; Delusions  History of Schizophrenia/Schizoaffective disorder:Yes  Duration of Psychotic Symptoms:Greater than six months  Hallucinations:No data recorded Ideas of Reference:Percusatory; Paranoia  Suicidal Thoughts:No data recorded Homicidal Thoughts:No data recorded  Sensorium  Memory: Immediate Fair; Recent Fair; Remote Fair  Judgment: Impaired  Insight: Poor; Lacking   Executive Functions  Concentration: Poor  Attention Span: Poor  Recall: Poor  Fund of Knowledge: Poor  Language: Poor   Psychomotor Activity  Psychomotor Activity:No data recorded  Assets  Assets: Physical Health; Resilience; Housing; Social Support   Sleep  Sleep:No data recorded   Physical Exam: Physical Exam Vitals and nursing note reviewed.  Constitutional:      Appearance: Normal appearance.  HENT:     Head: Normocephalic and atraumatic.     Mouth/Throat:     Pharynx: Oropharynx is clear.  Eyes:     Pupils: Pupils are equal, round, and reactive to light.  Cardiovascular:     Rate and Rhythm: Normal rate and regular rhythm.  Pulmonary:     Effort: Pulmonary effort is normal.     Breath sounds: Normal breath sounds.  Abdominal:     General: Abdomen is flat.     Palpations: Abdomen is soft.   Musculoskeletal:        General: Normal range of motion.  Skin:    General: Skin is warm and dry.  Neurological:     General: No focal deficit present.     Mental Status: She is alert. Mental status is at baseline.  Psychiatric:        Attention and Perception: She is inattentive.        Mood and Affect: Affect is labile and blunt.        Speech: Speech is tangential.        Behavior: Behavior is agitated. Behavior is not aggressive.        Thought Content: Thought content is paranoid and delusional.        Cognition and Memory: Cognition is impaired.        Judgment: Judgment is inappropriate.    Review of Systems  Constitutional: Negative.   HENT: Negative.    Eyes: Negative.   Respiratory: Negative.    Cardiovascular: Negative.   Gastrointestinal: Negative.   Musculoskeletal: Negative.   Skin: Negative.   Neurological: Negative.   Psychiatric/Behavioral: Negative.     Blood pressure 102/66, pulse (!) 58, temperature 98.5 F (36.9 C), temperature source Oral, resp. rate 18, height 5\' 4"  (1.626 m), weight 40.4 kg, SpO2 100 %. Body mass index is 15.28 kg/m.   Treatment Plan Summary: Medication management and Plan so hears this woman currently on both robust doses of olanzapine and high doses of Haldol and still completely psychotic.  Not delirious but truly paranoid and delusional.  Previously seems to have done okay with just Haldol at a more reasonable dose sometimes other long-acting injectables.  Appears to be compliant with medicine.  At this point we are making no progress and I suggest a change in medicine to something that may work differently.  I am discontinuing the olanzapine and starting her on Seroquel with an initial dose of 100 mg at night.  Alethia Berthold, MD 05/25/2022, 2:44 PM

## 2022-05-25 NOTE — Plan of Care (Signed)
D: Patient alert and oriented. Patient denies pain. Patient denies anxiety and depression. Patient denies SI/HI/AVH. Patient remains isolative to room with exception to coming out for meals and medication.  A: Scheduled medications administered to patient, per MD orders.  Support and encouragement provided to patient.  Q15 minute safety checks maintained.   R: Patient compliant with medication administration and treatment plan. No adverse drug reactions noted. Patient remains safe on the unit at this time.  Problem: Education: Goal: Knowledge of [place] [facility] Education information/materials will improve Outcome: Progressing Goal: Verbalization of understanding the information provided will improve Outcome: Progressing   Problem: Health Behavior/Discharge Planning: Goal: Compliance with treatment plan for underlying cause of condition will improve Outcome: Progressing   Problem: Physical Regulation: Goal: Ability to maintain clinical measurements within normal limits will improve Outcome: Progressing   Problem: Safety: Goal: Periods of time without injury will increase Outcome: Progressing   

## 2022-05-25 NOTE — Plan of Care (Signed)
°  Problem: Nutrition: °Goal: Adequate nutrition will be maintained °Outcome: Progressing °  °Problem: Coping: °Goal: Level of anxiety will decrease °Outcome: Progressing °  °Problem: Safety: °Goal: Ability to remain free from injury will improve °Outcome: Progressing °  °

## 2022-05-25 NOTE — Progress Notes (Signed)
Patient alert and oriented x 4 affect is flat but brightens upon approach, she forwards very little information, and she appears less anxious and her thoughts are organized. Patient is complaint with medication regimen, 15 minutes safety checks maintained will continue .

## 2022-05-25 NOTE — Progress Notes (Signed)
Unremarkable shift, out of room for meds and medications. No complaints or concerns voiced. Denies all, minimal interaction with staff none with peers. Encouragement and support provided. Safety checks maintained. Medications given as prescribed. Pt receptive and remains safe on unit with q 15 min checks.

## 2022-05-25 NOTE — Progress Notes (Signed)
Recreation Therapy Notes  Date: 05/25/2022  Time: 10:35 am     Location: Craft room   Behavioral response: [name]   Intervention Topic: Stress Management   Discussion/Intervention: Patient refused to attend group.   Clinical Observations/Feedback:  Patient refused to attend group.    Tee Richeson LRT/CTRS         [name] 05/25/2022 1:14 PM 

## 2022-05-25 NOTE — Group Note (Signed)
LCSW Group Therapy Note  Group Date: 05/25/2022 Start Time: 1300 End Time: 1400   Type of Therapy and Topic:  Group Therapy - How To Cope with Nervousness about Discharge   Participation Level:  Did Not Attend   Description of Group This process group involved identification of patients' feelings about discharge. Some of them are scheduled to be discharged soon, while others are new admissions, but each of them was asked to share thoughts and feelings surrounding discharge from the hospital. One common theme was that they are excited at the prospect of going home, while another was that many of them are apprehensive about sharing why they were hospitalized. Patients were given the opportunity to discuss these feelings with their peers in preparation for discharge.  Therapeutic Goals  Patient will identify their overall feelings about pending discharge. Patient will think about how they might proactively address issues that they believe will once again arise once they get home (i.e. with parents). Patients will participate in discussion about having hope for change.   Summary of Patient Progress:   Group was offered.  However, patient declined to attend.   Therapeutic Modalities Cognitive Behavioral Therapy   [name] 05/25/2022  2:11 PM

## 2022-05-25 NOTE — BH IP Treatment Plan (Signed)
Interdisciplinary Treatment and Diagnostic Plan Update  05/25/2022 Time of Session: 08:30 [name] MRN: [id number]  Principal Diagnosis: Schizoaffective disorder, depressive type (HCC)  Secondary Diagnoses: Principal Problem:   Schizoaffective disorder, depressive type (HCC)   Current Medications:  Current Facility-Administered Medications  Medication Dose Route Frequency Provider Last Rate Last Admin   acetaminophen (TYLENOL) tablet 650 mg  650 mg Oral Q6H PRN [name], [name], MD       alum & mag hydroxide-simeth (MAALOX/MYLANTA) 200-200-20 MG/5ML suspension 30 mL  30 mL Oral Q4H PRN [name], [name], MD       haloperidol (HALDOL) tablet 10 mg  10 mg Oral BID [name], [name], MD   10 mg at 05/25/22 0347   Or   haloperidol lactate (HALDOL) injection 5 mg  5 mg Intramuscular BID [name], [name], MD       lithium carbonate capsule 600 mg  600 mg Oral QHS [name], [name], MD   600 mg at 05/24/22 2123   LORazepam (ATIVAN) tablet 1 mg  1 mg Oral BID [name], [name], MD   1 mg at 05/25/22 4259   Or   LORazepam (ATIVAN) injection 1 mg  1 mg Intramuscular BID [name], [name], MD       magnesium hydroxide (MILK OF MAGNESIA) suspension 30 mL  30 mL Oral Daily PRN [name], [name], MD       mirtazapine (REMERON) tablet 30 mg  30 mg Oral QHS [name], [name], MD   30 mg at 05/24/22 2124   multivitamin with minerals tablet 1 tablet  1 tablet Oral Daily [name], [name], MD   1 tablet at 05/25/22 0755   OLANZapine (ZYPREXA) tablet 30 mg  30 mg Oral QHS [name], [name], MD   30 mg at 05/24/22 2124   thiamine (VITAMIN B1) tablet 100 mg  100 mg Oral Daily [name], [name], MD   100 mg at 05/25/22 0755   PTA Medications: Medications Prior to Admission  Medication Sig Dispense Refill Last Dose   clonazePAM (KLONOPIN) 0.5 MG tablet Take 0.5 tablets (0.25 mg total) by mouth 2 (two) times daily. (Patient not taking: Reported on 03/30/2022) 60 tablet 0    haloperidol (HALDOL) 5 MG tablet Take 1 tablet (5 mg  total) by mouth 2 (two) times daily. (Patient not taking: Reported on 03/30/2022) 60 tablet 3    midodrine (PROAMATINE) 2.5 MG tablet Take 1 tablet (2.5 mg total) by mouth 3 (three) times daily with meals. (Patient not taking: Reported on 03/30/2022)  0    mirtazapine (REMERON) 15 MG tablet Take 0.5 tablets (7.5 mg total) by mouth at bedtime. (Patient not taking: Reported on 03/30/2022) 30 tablet 2    Multiple Vitamin (MULTIVITAMIN WITH MINERALS) TABS tablet Take 1 tablet by mouth daily. (Patient not taking: Reported on 03/30/2022)      OLANZapine (ZYPREXA) 5 MG tablet Take 1 tablet (5 mg total) by mouth at bedtime. (Patient not taking: Reported on 03/30/2022) 30 tablet 2    thiamine 100 MG tablet Take 1 tablet (100 mg total) by mouth daily. (Patient not taking: Reported on 03/30/2022)       Patient Stressors: Marital or family conflict    Patient Strengths: Supportive family/friends   Treatment Modalities: Medication Management, Group therapy, Case management,  1 to 1 session with clinician, Psychoeducation, Recreational therapy.   Physician Treatment Plan for Primary Diagnosis: Schizoaffective disorder, depressive type (HCC) Long Term Goal(s): Improvement in symptoms so as ready for  discharge   Short Term Goals: Compliance with prescribed medications will improve Ability to verbalize feelings will improve Ability to demonstrate self-control will improve Ability to identify and develop effective coping behaviors will improve  Medication Management: Evaluate patient's response, side effects, and tolerance of medication regimen.  Therapeutic Interventions: 1 to 1 sessions, Unit Group sessions and Medication administration.  Evaluation of Outcomes: Progressing  Physician Treatment Plan for Secondary Diagnosis: Principal Problem:   Schizoaffective disorder, depressive type (HCC)  Long Term Goal(s): Improvement in symptoms so as ready for discharge   Short Term Goals: Compliance with  prescribed medications will improve Ability to verbalize feelings will improve Ability to demonstrate self-control will improve Ability to identify and develop effective coping behaviors will improve     Medication Management: Evaluate patient's response, side effects, and tolerance of medication regimen.  Therapeutic Interventions: 1 to 1 sessions, Unit Group sessions and Medication administration.  Evaluation of Outcomes: Progressing   RN Treatment Plan for Primary Diagnosis: Schizoaffective disorder, depressive type (HCC) Long Term Goal(s): Knowledge of disease and therapeutic regimen to maintain health will improve  Short Term Goals: Ability to remain free from injury will improve, Ability to verbalize frustration and anger appropriately will improve, Ability to demonstrate self-control, Ability to participate in decision making will improve, Ability to verbalize feelings will improve, Ability to disclose and discuss suicidal ideas, Ability to identify and develop effective coping behaviors will improve, and Compliance with prescribed medications will improve  Medication Management: RN will administer medications as ordered by provider, will assess and evaluate patient's response and provide education to patient for prescribed medication. RN will report any adverse and/or side effects to prescribing provider.  Therapeutic Interventions: 1 on 1 counseling sessions, Psychoeducation, Medication administration, Evaluate responses to treatment, Monitor vital signs and CBGs as ordered, Perform/monitor CIWA, COWS, AIMS and Fall Risk screenings as ordered, Perform wound care treatments as ordered.  Evaluation of Outcomes: Progressing   LCSW Treatment Plan for Primary Diagnosis: Schizoaffective disorder, depressive type (HCC) Long Term Goal(s): Safe transition to appropriate next level of care at discharge, Engage patient in therapeutic group addressing interpersonal concerns.  Short Term  Goals: Engage patient in aftercare planning with referrals and resources, Increase social support, Increase ability to appropriately verbalize feelings, Increase emotional regulation, Facilitate acceptance of mental health diagnosis and concerns, and Increase skills for wellness and recovery  Therapeutic Interventions: Assess for all discharge needs, 1 to 1 time with Social worker, Explore available resources and support systems, Assess for adequacy in community support network, Educate family and significant other(s) on suicide prevention, Complete Psychosocial Assessment, Interpersonal group therapy.  Evaluation of Outcomes: Progressing   Progress in Treatment: Attending groups: No. Participating in groups: No. Taking medication as prescribed: Yes. Toleration medication: Yes. Family/Significant other contact made: Yes, individual(s) contacted:  guardian/sister, [name] Escue-Lennon. Patient understands diagnosis: Yes. Discussing patient identified problems/goals with staff: Yes. Medical problems stabilized or resolved: Yes. Denies suicidal/homicidal ideation: Yes. Issues/concerns per patient self-inventory: No. Other: none.  New problem(s) identified: No, Describe:  none identified. Update 04/15/22: No changes at this time. Update 04/20/22: No changes at this time. Update 04/25/22: No changes at this time. Update 05/01/22: No changes at this time. 05/05/22 Update: No changes at this time. 05/10/22 Update: No changes at this time. 05/15/22 Update: No changes at this time. 05/21/22 Update: No changes at this time. 05/25/22 update: No changes at this time.   New Short Term/Long Term Goal(s): elimination of symptoms of psychosis, medication management for mood stabilization;  elimination of SI thoughts; development of comprehensive mental wellness plan. Update 04/15/22: No changes at this time. Update 04/20/22: No changes at this time. Update 04/25/22: No changes at this time. Update 05/01/22: No changes  at this time. 05/05/22 Update: No changes at this time. 05/10/22 Update: No changes at this time. 05/15/22 Update: No changes at this time. 05/21/22 Update: No changes at this time. 05/25/22 update: No changes at this time.   Patient Goals:  "Nothing I want to work on. I want to be discharged." Update 04/15/22: No changes at this time. Update 04/20/22: No changes at this time. Update 04/25/22: No changes at this time. Update 05/01/22: No changes at this time. 05/05/22 Update: No changes at this time. 05/10/22 Update: No changes at this time. 05/15/22 Update: No changes at this time. 05/21/22 Update: No changes at this time. 05/25/22 update: No changes at this time.   Discharge Plan or Barriers: CSW will assist pt with development of an appropriate aftercare/discharge plan. Update 04/15/22: Pt has been unwilling to engage with the treatment team around discharge. Update 04/20/22: No changes at this time.   Update 04/25/22: Patient will be returning to live with her sister. Pt will receive outpatient services through [facility]. Update 05/01/22: No changes at this time. 05/05/22 Update: No changes at this time. 05/10/22 Update: will return to her [facility] team. 05/15/22 Update: No changes at this time. 05/21/22 Update: No changes at this time. 05/25/22 update: No changes at this time.   Reason for Continuation of Hospitalization: Aggression Medication stabilization   Estimated Length of Stay: 1-7 days Update 04/15/22: No changes at this time. Update 04/20/22: TBD Update 04/25/22: TBD Update 05/01/22: No changes at this time. 05/05/22 Update: No changes at this time. 05/10/22 Update: No changes at this time. 05/15/22 Update: No changes at this time. 05/21/22 Update: No changes at this time. 05/25/22 update: No changes at this time.  Last 3 [place] Suicide Severity Risk Score: Flowsheet Row Admission (Current) from 04/08/2022 in [facility] Admission (Discharged) from 10/17/2021 in [facility] Admission (Discharged) from 10/02/2021 in [facility]  C-SSRS RISK CATEGORY No Risk No Risk No Risk       Last PHQ 2/9 Scores:     No data to display          Scribe for Treatment Team: Shirl Harris, LCSW 05/25/2022 9:06 AM

## 2022-05-25 NOTE — [facility] Group Notes (Signed)
[place] Group Notes:  (Nursing/MHT/Case Management/Adjunct)  Date:  05/25/2022  Time:  9:45 AM  Type of Therapy:  Group Therapy  Participation Level:  Minimal  Participation Quality:  Appropriate and Attentive  Affect:  Flat  Cognitive:  Alert and Appropriate  Insight:  Good and Improving  Engagement in Group:  Limited  Modes of Intervention:  Discussion  Summary of Progress/Problems:  [name],Paula Elliott E 05/25/2022, 9:45 AM

## 2022-05-26 ENCOUNTER — Telehealth: Payer: Self-pay | Admitting: *Deleted

## 2022-05-26 DIAGNOSIS — F251 Schizoaffective disorder, depressive type: Secondary | ICD-10-CM | POA: Diagnosis not present

## 2022-05-26 MED ORDER — QUETIAPINE FUMARATE 200 MG PO TABS
200.0000 mg | ORAL_TABLET | Freq: Every day | ORAL | Status: DC
  Administered 2022-05-26: 200 mg via ORAL
  Filled 2022-05-26: qty 1

## 2022-05-26 NOTE — Group Note (Signed)
LCSW Group Therapy Note  Group Date: 05/26/2022 Start Time: 1300 End Time: 1400   Type of Therapy and Topic:  Group Therapy - Healthy vs Unhealthy Coping Skills  Participation Level:  Did Not Attend   Description of Group The focus of this group was to determine what unhealthy coping techniques typically are used by group members and what healthy coping techniques would be helpful in coping with various problems. Patients were guided in becoming aware of the differences between healthy and unhealthy coping techniques. Patients were asked to identify 2-3 healthy coping skills they would like to learn to use more effectively.  Therapeutic Goals Patients learned that coping is what human beings do all day long to deal with various situations in their lives Patients defined and discussed healthy vs unhealthy coping techniques Patients identified their preferred coping techniques and identified whether these were healthy or unhealthy Patients determined 2-3 healthy coping skills they would like to become more familiar with and use more often. Patients provided support and ideas to each other   Summary of Patient Progress:   Patient did not attend group despite encouraged participation.    Therapeutic Modalities Cognitive Behavioral Therapy Motivational Interviewing  Larose Kells 05/26/2022  3:09 PM

## 2022-05-26 NOTE — Progress Notes (Signed)
[facility] MD Progress Note  05/26/2022 4:51 PM [name]  MRN:  [id number] Subjective: Patient seen and chart reviewed.  Patient continues to be agitated and confused much of the time.  No change in overall behavior.  Not over sedated at all today.  No new side effects Principal Problem: Schizoaffective disorder, depressive type ([place]) Diagnosis: Principal Problem:   Schizoaffective disorder, depressive type ([place])  Total Time spent with patient: 30 minutes  Past Psychiatric History: Past history of schizophrenia  Past Medical History:  Past Medical History:  Diagnosis Date   Anemia 10/02/2021   Non compliance w medication regimen    Schizophrenia [facility])     Past Surgical History:  Procedure Laterality Date   BIOPSY  09/25/2021   Procedure: BIOPSY;  Surgeon: Milus Banister, MD;  Location: [facility];  Service: Gastroenterology;;   ESOPHAGOGASTRODUODENOSCOPY (EGD) WITH PROPOFOL [name] 09/25/2021   Procedure: ESOPHAGOGASTRODUODENOSCOPY (EGD) WITH PROPOFOL;  Surgeon: Milus Banister, MD;  Location: [facility];  Service: Gastroenterology;  Laterality: [name];  With NGT placement   Family History: History reviewed. No pertinent family history. Family Psychiatric  History: See previous Social History:  Social History   Substance and Sexual Activity  Alcohol Use Not Currently   Comment: Refuses to disclose how much     Social History   Substance and Sexual Activity  Drug Use No   Comment: Refuses to answer    Social History   Socioeconomic History   Marital status: Single    Spouse name: Not on file   Number of children: Not on file   Years of education: Not on file   Highest education level: Not on file  Occupational History   Not on file  Tobacco Use   Smoking status: Every Day    Packs/day: 2.00    Types: Cigarettes   Smokeless tobacco: Never  Substance and Sexual Activity   Alcohol use: Not Currently    Comment: Refuses to disclose how much   Drug use: No    Comment:  Refuses to answer   Sexual activity: Not Currently    Comment: refused to answer  Other Topics Concern   Not on file  Social History Narrative   Not on file   Social Determinants of Health   Financial Resource Strain: Not on file  Food Insecurity: No Food Insecurity (04/08/2022)   Hunger Vital Sign    Worried About Running Out of Food in the Last Year: Never true    Ran Out of Food in the Last Year: Never true  Transportation Needs: No Transportation Needs (04/08/2022)   PRAPARE - Hydrologist (Medical): No    Lack of Transportation (Non-Medical): No  Physical Activity: Not on file  Stress: Not on file  Social Connections: Not on file   Additional Social History:                         Sleep: Fair  Appetite:  Fair  Current Medications: Current Facility-Administered Medications  Medication Dose Route Frequency Provider Last Rate Last Admin   acetaminophen (TYLENOL) tablet 650 mg  650 mg Oral Q6H PRN Alnita Aybar T, MD       alum & mag hydroxide-simeth (MAALOX/MYLANTA) 200-200-20 MG/5ML suspension 30 mL  30 mL Oral Q4H PRN Jayden Rudge T, MD       haloperidol (HALDOL) tablet 10 mg  10 mg Oral BID Marisa Hufstetler, [name], MD   10 mg at  05/26/22 1627   Or   haloperidol lactate (HALDOL) injection 5 mg  5 mg Intramuscular BID Valen Gillison, [name], MD       lithium carbonate capsule 600 mg  600 mg Oral QHS Tayron Hunnell T, MD   600 mg at 05/25/22 2050   LORazepam (ATIVAN) tablet 1 mg  1 mg Oral BID Demetri Goshert, [name], MD   1 mg at 05/26/22 1627   Or   LORazepam (ATIVAN) injection 1 mg  1 mg Intramuscular BID Mineola Duan, [name], MD       magnesium hydroxide (MILK OF MAGNESIA) suspension 30 mL  30 mL Oral Daily PRN Aritzel Krusemark, [name], MD       mirtazapine (REMERON) tablet 30 mg  30 mg Oral QHS Eleana Tocco T, MD   30 mg at 05/25/22 2050   multivitamin with minerals tablet 1 tablet  1 tablet Oral Daily Jamal Haskin, [name], MD   1 tablet at 05/26/22 0801   QUEtiapine  (SEROQUEL) tablet 200 mg  200 mg Oral QHS Evalisse Prajapati, [name], MD       thiamine (VITAMIN B1) tablet 100 mg  100 mg Oral Daily Chianne Byrns, [name], MD   100 mg at 05/26/22 [id number]    Lab Results:  Results for orders placed or performed during the hospital encounter of 04/08/22 (from the past 48 hour(s))  Lithium level     Status: Abnormal   Collection Time: 05/25/22  9:06 AM  Result Value Ref Range   Lithium Lvl 0.58 (L) 0.60 - 1.20 mmol/L    Comment: Performed at [facility], [address] [place], [place] [address]    Blood Alcohol level:  Lab Results  Component Value Date   [facility] <10 03/29/2022   ETH <10 123XX123    Metabolic Disorder Labs: Lab Results  Component Value Date   HGBA1C 5.6 10/21/2021   MPG 114 10/21/2021   MPG 111 10/02/2021   Lab Results  Component Value Date   PROLACTIN 13.6 04/16/2015   Lab Results  Component Value Date   CHOL 189 10/21/2021   TRIG 52 10/21/2021   HDL 50 10/21/2021   CHOLHDL 3.8 10/21/2021   VLDL 10 10/21/2021   LDLCALC 129 (H) 10/21/2021   LDLCALC 93 10/01/2020    Physical Findings: AIMS: Facial and Oral Movements Muscles of Facial Expression: None, normal Lips and Perioral Area: None, normal Jaw: None, normal Tongue: None, normal,Extremity Movements Upper (arms, wrists, hands, fingers): None, normal Lower (legs, knees, ankles, toes): None, normal, Trunk Movements Neck, shoulders, hips: None, normal, Overall Severity Severity of abnormal movements (highest score from questions above): None, normal Incapacitation due to abnormal movements: None, normal Patient's awareness of abnormal movements (rate only patient's report): No Awareness, Dental Status Current problems with teeth and/or dentures?: No Does patient usually wear dentures?: No  CIWA:    COWS:     Musculoskeletal: Strength & Muscle Tone: within normal limits Gait & Station: normal Patient leans: [name]  Psychiatric Specialty Exam:  Presentation  General  Appearance:  Disheveled  Eye Contact: Fair  Speech: Pressured  Speech Volume: Increased  Handedness: Right   Mood and Affect  Mood: Irritable; Labile; Angry  Affect: Blunt; Labile   Thought Process  Thought Processes: Disorganized; Irrevelant  Descriptions of Associations:Loose  Orientation:Partial  Thought Content:Illogical; Rumination; Scattered; Tangential; Delusions  History of Schizophrenia/Schizoaffective disorder:Yes  Duration of Psychotic Symptoms:Greater than six months  Hallucinations:No data recorded Ideas of Reference:Percusatory; Paranoia  Suicidal Thoughts:No data recorded Homicidal Thoughts:No  data recorded  Sensorium  Memory: Immediate Fair; Recent Fair; Remote Fair  Judgment: Impaired  Insight: Poor; Lacking   Executive Functions  Concentration: Poor  Attention Span: Poor  Recall: Poor  Fund of Knowledge: Poor  Language: Poor   Psychomotor Activity  Psychomotor Activity:No data recorded  Assets  Assets: Physical Health; Resilience; Housing; Social Support   Sleep  Sleep:No data recorded   Physical Exam: Physical Exam Vitals and nursing note reviewed.  Constitutional:      Appearance: Normal appearance.  HENT:     Head: Normocephalic and atraumatic.     Mouth/Throat:     Pharynx: Oropharynx is clear.  Eyes:     Pupils: Pupils are equal, round, and reactive to light.  Cardiovascular:     Rate and Rhythm: Normal rate and regular rhythm.  Pulmonary:     Effort: Pulmonary effort is normal.     Breath sounds: Normal breath sounds.  Abdominal:     General: Abdomen is flat.     Palpations: Abdomen is soft.  Musculoskeletal:        General: Normal range of motion.  Skin:    General: Skin is warm and dry.  Neurological:     General: No focal deficit present.     Mental Status: She is alert. Mental status is at baseline.  Psychiatric:        Attention and Perception: Attention normal.        Mood and  Affect: Mood is anxious.        Speech: She is noncommunicative.        Behavior: Behavior is agitated and withdrawn.        Thought Content: Thought content is paranoid and delusional.        Cognition and Memory: Cognition is impaired.    Review of Systems  Constitutional: Negative.   HENT: Negative.    Eyes: Negative.   Respiratory: Negative.    Cardiovascular: Negative.   Gastrointestinal: Negative.   Musculoskeletal: Negative.   Skin: Negative.   Neurological: Negative.   Psychiatric/Behavioral: Negative.     Blood pressure 101/65, pulse 63, temperature 98.7 F (37.1 C), temperature source Oral, resp. rate 18, height 5\' 4"  (1.626 m), weight 40.4 kg, SpO2 97 %. Body mass index is 15.28 kg/m.   Treatment Plan Summary: Medication management and Plan increase Seroquel dose to 200 mg at night titrating up towards antipsychotic dose.  Alethia Berthold, MD 05/26/2022, 4:51 PM

## 2022-05-26 NOTE — Plan of Care (Signed)
D: Patient alert and oriented. Patient denies pain. Patient denies anxiety and depression. Patient denies SI/HI/AVH.  A: Scheduled medications administered to patient, per MD orders.  Support and encouragement provided to patient.  Q15 minute safety checks maintained.   R: Patient compliant with medication administration and treatment plan. No adverse drug reactions noted. Patient remains safe on the unit at this time. Problem: Education: Goal: Knowledge of [place] [facility] Education information/materials will improve Outcome: Progressing Goal: Verbalization of understanding the information provided will improve Outcome: Progressing   Problem: Health Behavior/Discharge Planning: Goal: Compliance with treatment plan for underlying cause of condition will improve Outcome: Progressing   Problem: Physical Regulation: Goal: Ability to maintain clinical measurements within normal limits will improve Outcome: Progressing   Problem: Safety: Goal: Periods of time without injury will increase Outcome: Progressing

## 2022-05-26 NOTE — Progress Notes (Signed)
Recreation Therapy Notes     Date: 05/26/2022   Time: 10:40 am      Location: Craft room    Behavioral response: [name]   Intervention Topic: Relaxation    Discussion/Intervention: Patient refused to attend group.    Clinical Observations/Feedback:  Patient refused to attend group.    Amantha Sklar LRT/CTRS        Jerzee Jerome 05/26/2022 11:46 AM

## 2022-05-26 NOTE — Patient Outreach (Signed)
Incoming call from [employer]. She reports her sister has been an inpt at [facility] since September 20th and that there is talk of discharging her. Ms. [name] is worried that the pt would be discharged back to the home she shared with her mother, however, their mother would not be able to provide the care the pt needs and it may be a dangerous situation. She is asking for some assistance in addition to the helpful assistance of Robbie, LCSW, there at the hospital. Advised I would contact Heath Lark and we would discuss the plan of care.  Of note, [name] is pt guardian. She is also trying to work with the [facility] to get her placed there but has run into some difficulties there.  [name], he had left for the day and NP was advised to call back tomorrow.  [name]. [name], MSN, [facility] Gerontological Nurse Practitioner [facility] Care Management [phone]

## 2022-05-27 ENCOUNTER — Telehealth: Payer: Self-pay | Admitting: *Deleted

## 2022-05-27 DIAGNOSIS — F251 Schizoaffective disorder, depressive type: Secondary | ICD-10-CM | POA: Diagnosis not present

## 2022-05-27 MED ORDER — QUETIAPINE FUMARATE 200 MG PO TABS
300.0000 mg | ORAL_TABLET | Freq: Every day | ORAL | Status: DC
  Administered 2022-05-27: 300 mg via ORAL
  Filled 2022-05-27: qty 1

## 2022-05-27 MED ORDER — HALOPERIDOL LACTATE 5 MG/ML IJ SOLN
5.0000 mg | Freq: Two times a day (BID) | INTRAMUSCULAR | Status: DC
  Filled 2022-05-27 (×2): qty 1

## 2022-05-27 MED ORDER — HALOPERIDOL 5 MG PO TABS
5.0000 mg | ORAL_TABLET | Freq: Two times a day (BID) | ORAL | Status: DC
  Administered 2022-05-27 – 2022-08-07 (×144): 5 mg via ORAL
  Filled 2022-05-27 (×146): qty 1

## 2022-05-27 NOTE — Progress Notes (Signed)
Recreation Therapy Notes  Date: 05/27/2022  Time: 10:50 am     Location: Craft room   Behavioral response: [name]   Intervention Topic: Problem Solving   Discussion/Intervention: Patient refused to attend group.   Clinical Observations/Feedback:  Patient refused to attend group.    Edison Nicholson LRT/CTRS        Netty Sullivant 05/27/2022 12:02 PM

## 2022-05-27 NOTE — Progress Notes (Signed)
Pt denies SI/HI/AVH and verbally agrees to approach staff if these become apparent or before harming themselves/others. Rates depression 0/10. Rates anxiety 0/10. Rates pain 0/10. Pt has been in her room for most of the day. Scheduled medications administered to pt, per MD orders. RN provided support and encouragement to pt. Q15 min safety checks implemented and continued. Pt safe on the unit. RN will continue to monitor and intervene as needed. Problem: Nutrition: Goal: Adequate nutrition will be maintained Outcome: Progressing   Problem: Coping: Goal: Level of anxiety will decrease Outcome: Progressing    05/27/22 0800  Psych Admission Type (Psych Patients Only)  Admission Status Involuntary  Psychosocial Assessment  Patient Complaints None  Eye Contact Brief  Facial Expression Flat  Affect Flat  Speech Logical/coherent  Interaction Forwards little;Minimal  Motor Activity Slow  Appearance/Hygiene Unremarkable  Behavior Characteristics Cooperative;Appropriate to situation;Calm  Mood Pleasant  Thought Process  Coherency WDL  Content WDL  Delusions None reported or observed  Perception WDL  Hallucination None reported or observed  Judgment WDL  Confusion None  Danger to Self  Current suicidal ideation? Denies  Danger to Others  Danger to Others None reported or observed

## 2022-05-27 NOTE — Group Note (Signed)
[facility] LCSW Group Therapy Note   Group Date: 05/27/2022 Start Time: 1300 End Time: 1400   Type of Therapy/Topic:  Group Therapy:  Emotion Regulation  Participation Level:  Did Not Attend    Description of Group:    The purpose of this group is to assist patients in learning to regulate negative emotions and experience positive emotions. Patients will be guided to discuss ways in which they have been vulnerable to their negative emotions. These vulnerabilities will be juxtaposed with experiences of positive emotions or situations, and patients challenged to use positive emotions to combat negative ones. Special emphasis will be placed on coping with negative emotions in conflict situations, and patients will process healthy conflict resolution skills.  Therapeutic Goals: Patient will identify two positive emotions or experiences to reflect on in order to balance out negative emotions:  Patient will label two or more emotions that they find the most difficult to experience:  Patient will be able to demonstrate positive conflict resolution skills through discussion or role plays:   Summary of Patient Progress: X   Therapeutic Modalities:   Cognitive Behavioral Therapy Feelings Identification Dialectical Behavioral Therapy   [name] R [name], LCSW 

## 2022-05-27 NOTE — Progress Notes (Signed)
Patient calm and pleasant during assessment denying SI/HI/AVH. Pt observed interacting appropriately with staff and peers on the unit. Pt compliant with medication administration per MD orders. Pt given education, support, and encouragement to be active in her treatment plan. Pt being monitored Q 15 minutes for safety per unit protocol, remains safe on the unit   

## 2022-05-27 NOTE — Progress Notes (Signed)
Patient presents with sad affect. Denies SI, HI, AVH. Medication compliant. Appropriate with staff and peers. Encouragement and support provided. Safety checks maintained. Medications given as prescribed. Patient receptive and remains safe on unit with q 15 min checks.

## 2022-05-27 NOTE — Patient Outreach (Signed)
  Care Coordination   Telephone coordination   05/27/2022 Name: Rhiannan Kievit MRN: [id number] DOB: 1960-11-04  Aden Youngman is a 61 y.o. year old female who sees [facility] for primary care. I  called [facility] and attempted to reach [place], LCSW. In response to a request pt this pt's sister and guardian.   SDOH assessments and interventions completed:  {yes/no:20286}{THN Tip this will not be part of the note when signed-REQUIRED REPORT FIELD DO NOT DELETE (Optional):27901}     Care Coordination Interventions Activated:  {CCYES/NO:27769} {THN Tip this will not be part of the note when signed-REQUIRED REPORT FIELD DO NOT DELETE (Optional):27901} Care Coordination Interventions:  {INTERVENTIONS:27767} {THN Tip this will not be part of the note when signed-REQUIRED REPORT FIELD DO NOT DELETE (Optional):27901}  Follow up plan: {CCFOLLOWUP:27768}   Encounter Outcome:  {ENCOUTCOME:27770} {THN Tip this will not be part of the note when signed-REQUIRED REPORT FIELD DO NOT DELETE (Optional):27901}

## 2022-05-27 NOTE — Plan of Care (Signed)
  Problem: Safety: Goal: Ability to remain free from injury will improve Outcome: Progressing   Problem: Education: Goal: Will be free of psychotic symptoms Outcome: Progressing

## 2022-05-27 NOTE — Progress Notes (Signed)
[facility] MD Progress Note  05/27/2022 2:43 PM [name]  MRN:  [id number] Subjective: Follow-up for this 61 year old woman with schizophrenia or schizoaffective disorder.  Seems to be tolerating medication.  No new complaints.  Not much different today.  May be a little bit less hostile but I do not know that I can really draw any conclusions about it. Principal Problem: Schizoaffective disorder, depressive type (HCC) Diagnosis: Principal Problem:   Schizoaffective disorder, depressive type (HCC)  Total Time spent with patient: 30 minutes  Past Psychiatric History: Past history of chronic psychotic disorder  Past Medical History:  Past Medical History:  Diagnosis Date   Anemia 10/02/2021   Non compliance w medication regimen    Schizophrenia [facility])     Past Surgical History:  Procedure Laterality Date   BIOPSY  09/25/2021   Procedure: BIOPSY;  Surgeon: Rachael Fee, MD;  Location: [facility];  Service: Gastroenterology;;   ESOPHAGOGASTRODUODENOSCOPY (EGD) WITH PROPOFOL [name] 09/25/2021   Procedure: ESOPHAGOGASTRODUODENOSCOPY (EGD) WITH PROPOFOL;  Surgeon: Rachael Fee, MD;  Location: [facility];  Service: Gastroenterology;  Laterality: [name];  With NGT placement   Family History: History reviewed. No pertinent family history. Family Psychiatric  History: See previous Social History:  Social History   Substance and Sexual Activity  Alcohol Use Not Currently   Comment: Refuses to disclose how much     Social History   Substance and Sexual Activity  Drug Use No   Comment: Refuses to answer    Social History   Socioeconomic History   Marital status: Single    Spouse name: Not on file   Number of children: Not on file   Years of education: Not on file   Highest education level: Not on file  Occupational History   Not on file  Tobacco Use   Smoking status: Every Day    Packs/day: 2.00    Types: Cigarettes   Smokeless tobacco: Never  Substance and Sexual Activity    Alcohol use: Not Currently    Comment: Refuses to disclose how much   Drug use: No    Comment: Refuses to answer   Sexual activity: Not Currently    Comment: refused to answer  Other Topics Concern   Not on file  Social History Narrative   Not on file   Social Determinants of Health   Financial Resource Strain: Not on file  Food Insecurity: No Food Insecurity (04/08/2022)   Hunger Vital Sign    Worried About Running Out of Food in the Last Year: Never true    Ran Out of Food in the Last Year: Never true  Transportation Needs: No Transportation Needs (04/08/2022)   PRAPARE - Administrator, Civil Service (Medical): No    Lack of Transportation (Non-Medical): No  Physical Activity: Not on file  Stress: Not on file  Social Connections: Not on file   Additional Social History:                         Sleep: Fair  Appetite:  Fair  Current Medications: Current Facility-Administered Medications  Medication Dose Route Frequency Provider Last Rate Last Admin   acetaminophen (TYLENOL) tablet 650 mg  650 mg Oral Q6H PRN Daveion Robar T, MD       alum & mag hydroxide-simeth (MAALOX/MYLANTA) 200-200-20 MG/5ML suspension 30 mL  30 mL Oral Q4H PRN Siyah Mault T, MD       haloperidol (HALDOL) tablet 5 mg  5 mg Oral BID Mckell Riecke, [name], MD       Or   haloperidol lactate (HALDOL) injection 5 mg  5 mg Intramuscular BID Kasumi Ditullio T, MD       lithium carbonate capsule 600 mg  600 mg Oral QHS Mariaceleste Herrera T, MD   600 mg at 05/26/22 2042   LORazepam (ATIVAN) tablet 1 mg  1 mg Oral BID Blondina Coderre T, MD   1 mg at 05/27/22 0801   Or   LORazepam (ATIVAN) injection 1 mg  1 mg Intramuscular BID Kena Limon T, MD       magnesium hydroxide (MILK OF MAGNESIA) suspension 30 mL  30 mL Oral Daily PRN Caylyn Tedeschi T, MD       mirtazapine (REMERON) tablet 30 mg  30 mg Oral QHS Luisdaniel Kenton T, MD   30 mg at 05/26/22 2042   multivitamin with minerals tablet 1 tablet  1 tablet  Oral Daily Gresia Isidoro, [name], MD   1 tablet at 05/27/22 0800   QUEtiapine (SEROQUEL) tablet 300 mg  300 mg Oral QHS Kishia Shackett T, MD       thiamine (VITAMIN B1) tablet 100 mg  100 mg Oral Daily Trevell Pariseau T, MD   100 mg at 05/27/22 0800    Lab Results: No results found for this or any previous visit (from the past 48 hour(s)).  Blood Alcohol level:  Lab Results  Component Value Date   ETH <10 03/29/2022   ETH <10 09/12/2021    Metabolic Disorder Labs: Lab Results  Component Value Date   HGBA1C 5.6 10/21/2021   MPG 114 10/21/2021   MPG 111 10/02/2021   Lab Results  Component Value Date   PROLACTIN 13.6 04/16/2015   Lab Results  Component Value Date   CHOL 189 10/21/2021   TRIG 52 10/21/2021   HDL 50 10/21/2021   CHOLHDL 3.8 10/21/2021   VLDL 10 10/21/2021   LDLCALC 129 (H) 10/21/2021   LDLCALC 93 10/01/2020    Physical Findings: AIMS: Facial and Oral Movements Muscles of Facial Expression: None, normal Lips and Perioral Area: None, normal Jaw: None, normal Tongue: None, normal,Extremity Movements Upper (arms, wrists, hands, fingers): None, normal Lower (legs, knees, ankles, toes): None, normal, Trunk Movements Neck, shoulders, hips: None, normal, Overall Severity Severity of abnormal movements (highest score from questions above): None, normal Incapacitation due to abnormal movements: None, normal Patient's awareness of abnormal movements (rate only patient's report): No Awareness, Dental Status Current problems with teeth and/or dentures?: No Does patient usually wear dentures?: No  CIWA:    COWS:     Musculoskeletal: Strength & Muscle Tone: within normal limits Gait & Station: normal Patient leans: [name]  Psychiatric Specialty Exam:  Presentation  General Appearance:  Disheveled  Eye Contact: Fair  Speech: Pressured  Speech Volume: Increased  Handedness: Right   Mood and Affect  Mood: Irritable; Labile; Angry  Affect: Blunt;  Labile   Thought Process  Thought Processes: Disorganized; Irrevelant  Descriptions of Associations:Loose  Orientation:Partial  Thought Content:Illogical; Rumination; Scattered; Tangential; Delusions  History of Schizophrenia/Schizoaffective disorder:Yes  Duration of Psychotic Symptoms:Greater than six months  Hallucinations:No data recorded Ideas of Reference:Percusatory; Paranoia  Suicidal Thoughts:No data recorded Homicidal Thoughts:No data recorded  Sensorium  Memory: Immediate Fair; Recent Fair; Remote Fair  Judgment: Impaired  Insight: Poor; Lacking   Executive Functions  Concentration: Poor  Attention Span: Poor  Recall: Poor  Fund of Knowledge: Poor  Language: Poor   Psychomotor  Activity  Psychomotor Activity:No data recorded  Assets  Assets: Physical Health; Resilience; Housing; Social Support   Sleep  Sleep:No data recorded   Physical Exam: Physical Exam Vitals and nursing note reviewed.  Constitutional:      Appearance: Normal appearance.  HENT:     Head: Normocephalic and atraumatic.     Mouth/Throat:     Pharynx: Oropharynx is clear.  Eyes:     Pupils: Pupils are equal, round, and reactive to light.  Cardiovascular:     Rate and Rhythm: Normal rate and regular rhythm.  Pulmonary:     Effort: Pulmonary effort is normal.     Breath sounds: Normal breath sounds.  Abdominal:     General: Abdomen is flat.     Palpations: Abdomen is soft.  Musculoskeletal:        General: Normal range of motion.  Skin:    General: Skin is warm and dry.  Neurological:     General: No focal deficit present.     Mental Status: She is alert. Mental status is at baseline.  Psychiatric:        Attention and Perception: She is inattentive.        Mood and Affect: Affect is blunt.        Speech: Speech is delayed.        Behavior: Behavior is slowed.        Thought Content: Thought content normal.        Cognition and Memory: Cognition is  impaired.    Review of Systems  Constitutional: Negative.   HENT: Negative.    Eyes: Negative.   Respiratory: Negative.    Cardiovascular: Negative.   Gastrointestinal: Negative.   Musculoskeletal: Negative.   Skin: Negative.   Neurological: Negative.   Psychiatric/Behavioral:  The patient is nervous/anxious.    Blood pressure 106/65, pulse (!) 58, temperature 98.5 F (36.9 C), temperature source Oral, resp. rate 18, height 5\' 4"  (1.626 m), weight 40.4 kg, SpO2 99 %. Body mass index is 15.28 kg/m.   Treatment Plan Summary: Medication management and Plan increase Seroquel to 300 mg at night with goal of getting up into the antipsychotic range.  So far tolerating it fine.  I will cut back her Haldol to 5 mg twice a day to decrease risk of interaction or over sedation  , MD 05/27/2022, 2:43 PM

## 2022-05-28 DIAGNOSIS — F251 Schizoaffective disorder, depressive type: Secondary | ICD-10-CM | POA: Diagnosis not present

## 2022-05-28 MED ORDER — QUETIAPINE FUMARATE 200 MG PO TABS
400.0000 mg | ORAL_TABLET | Freq: Every day | ORAL | Status: DC
  Administered 2022-05-28 – 2022-06-04 (×8): 400 mg via ORAL
  Filled 2022-05-28 (×8): qty 2

## 2022-05-28 NOTE — Progress Notes (Signed)
Pt denies SI/HI/AVH and verbally agrees to approach staff if these become apparent or before harming themselves/others. Rates depression 0/10. Rates anxiety 0/10. Rates pain 0/10. Pt smiled a little bit today when writer was joking around with pt. Scheduled medications administered to pt, per MD orders. RN provided support and encouragement to pt. Q15 min safety checks implemented and continued. Pt safe on the unit. RN will continue to monitor and intervene as needed. Problem: Coping: Goal: Level of anxiety will decrease Outcome: Progressing   Problem: Elimination: Goal: Will not experience complications related to bowel motility Outcome: Progressing    05/28/22 0805  Psych Admission Type (Psych Patients Only)  Admission Status Involuntary  Psychosocial Assessment  Patient Complaints None  Eye Contact Brief  Facial Expression Flat  Affect Flat  Speech Logical/coherent  Interaction Minimal  Motor Activity Slow  Appearance/Hygiene Unremarkable;In scrubs  Behavior Characteristics Cooperative;Appropriate to situation;Calm  Mood Pleasant  Thought Process  Coherency WDL  Content WDL  Delusions None reported or observed  Perception WDL  Hallucination None reported or observed  Judgment WDL  Confusion None  Danger to Self  Current suicidal ideation? Denies  Danger to Others  Danger to Others None reported or observed

## 2022-05-28 NOTE — Progress Notes (Signed)
Recreation Therapy Notes  Date: 05/28/2022  Time: 10:20 am     Location: Courtyard    Behavioral response: [name]   Intervention Topic: Wellness   Discussion/Intervention: Patient refused to attend group.   Clinical Observations/Feedback:  Patient refused to attend group.    Chue Berkovich LRT/CTRS        [name] Haff 05/28/2022 12:21 PM

## 2022-05-28 NOTE — Group Note (Signed)

## 2022-05-28 NOTE — Progress Notes (Signed)
[facility] MD Progress Note  05/28/2022 5:00 PM [name]  MRN:  [id number] Subjective: Follow-up 61 year old woman with schizoaffective disorder.  Tolerating titration of Seroquel.  She was out of her room more than usual today interacting with others.  Still shows signs of paranoia and delusions and irritability but does not seem any worse than before.  Not over sedated. Principal Problem: Schizoaffective disorder, depressive type ([place]) Diagnosis: Principal Problem:   Schizoaffective disorder, depressive type ([place])  Total Time spent with patient: 30 minutes  Past Psychiatric History: Past history of schizoaffective disorder  Past Medical History:  Past Medical History:  Diagnosis Date   Anemia 10/02/2021   Non compliance w medication regimen    Schizophrenia [facility])     Past Surgical History:  Procedure Laterality Date   BIOPSY  09/25/2021   Procedure: BIOPSY;  Surgeon: Milus Banister, MD;  Location: [facility];  Service: Gastroenterology;;   ESOPHAGOGASTRODUODENOSCOPY (EGD) WITH PROPOFOL [name] 09/25/2021   Procedure: ESOPHAGOGASTRODUODENOSCOPY (EGD) WITH PROPOFOL;  Surgeon: Milus Banister, MD;  Location: [facility];  Service: Gastroenterology;  Laterality: [name];  With NGT placement   Family History: History reviewed. No pertinent family history. Family Psychiatric  History: See previous Social History:  Social History   Substance and Sexual Activity  Alcohol Use Not Currently   Comment: Refuses to disclose how much     Social History   Substance and Sexual Activity  Drug Use No   Comment: Refuses to answer    Social History   Socioeconomic History   Marital status: Single    Spouse name: Not on file   Number of children: Not on file   Years of education: Not on file   Highest education level: Not on file  Occupational History   Not on file  Tobacco Use   Smoking status: Every Day    Packs/day: 2.00    Types: Cigarettes   Smokeless tobacco: Never  Substance and  Sexual Activity   Alcohol use: Not Currently    Comment: Refuses to disclose how much   Drug use: No    Comment: Refuses to answer   Sexual activity: Not Currently    Comment: refused to answer  Other Topics Concern   Not on file  Social History Narrative   Not on file   Social Determinants of Health   Financial Resource Strain: Not on file  Food Insecurity: No Food Insecurity (04/08/2022)   Hunger Vital Sign    Worried About Running Out of Food in the Last Year: Never true    Ran Out of Food in the Last Year: Never true  Transportation Needs: No Transportation Needs (04/08/2022)   PRAPARE - Hydrologist (Medical): No    Lack of Transportation (Non-Medical): No  Physical Activity: Not on file  Stress: Not on file  Social Connections: Not on file   Additional Social History:                         Sleep: Fair  Appetite:  Fair  Current Medications: Current Facility-Administered Medications  Medication Dose Route Frequency Provider Last Rate Last Admin   acetaminophen (TYLENOL) tablet 650 mg  650 mg Oral Q6H PRN Teyanna Thielman T, MD       alum & mag hydroxide-simeth (MAALOX/MYLANTA) 200-200-20 MG/5ML suspension 30 mL  30 mL Oral Q4H PRN Oliverio Cho T, MD       haloperidol (HALDOL) tablet 5 mg  5 mg Oral BID Cecille Mcclusky T, MD   5 mg at 05/28/22 1640   Or   haloperidol lactate (HALDOL) injection 5 mg  5 mg Intramuscular BID Leylanie Woodmansee T, MD       lithium carbonate capsule 600 mg  600 mg Oral QHS Volanda Mangine T, MD   600 mg at 05/27/22 2058   LORazepam (ATIVAN) tablet 1 mg  1 mg Oral BID Marsela Kuan T, MD   1 mg at 05/28/22 1640   Or   LORazepam (ATIVAN) injection 1 mg  1 mg Intramuscular BID Darra Rosa T, MD       magnesium hydroxide (MILK OF MAGNESIA) suspension 30 mL  30 mL Oral Daily PRN Tausha Milhoan T, MD       mirtazapine (REMERON) tablet 30 mg  30 mg Oral QHS Izell Labat, [name], MD   30 mg at 05/27/22 2058   multivitamin  with minerals tablet 1 tablet  1 tablet Oral Daily Jari Carollo, [name], MD   1 tablet at 05/28/22 0805   QUEtiapine (SEROQUEL) tablet 400 mg  400 mg Oral QHS Keenon Leitzel T, MD       thiamine (VITAMIN B1) tablet 100 mg  100 mg Oral Daily Issabella Rix, [name], MD   100 mg at 05/28/22 0805    Lab Results: No results found for this or any previous visit (from the past 48 hour(s)).  Blood Alcohol level:  Lab Results  Component Value Date   ETH <10 03/29/2022   ETH <10 09/12/2021    Metabolic Disorder Labs: Lab Results  Component Value Date   HGBA1C 5.6 10/21/2021   MPG 114 10/21/2021   MPG 111 10/02/2021   Lab Results  Component Value Date   PROLACTIN 13.6 04/16/2015   Lab Results  Component Value Date   CHOL 189 10/21/2021   TRIG 52 10/21/2021   HDL 50 10/21/2021   CHOLHDL 3.8 10/21/2021   VLDL 10 10/21/2021   LDLCALC 129 (H) 10/21/2021   LDLCALC 93 10/01/2020    Physical Findings: AIMS: Facial and Oral Movements Muscles of Facial Expression: None, normal Lips and Perioral Area: None, normal Jaw: None, normal Tongue: None, normal,Extremity Movements Upper (arms, wrists, hands, fingers): None, normal Lower (legs, knees, ankles, toes): None, normal, Trunk Movements Neck, shoulders, hips: None, normal, Overall Severity Severity of abnormal movements (highest score from questions above): None, normal Incapacitation due to abnormal movements: None, normal Patient's awareness of abnormal movements (rate only patient's report): No Awareness, Dental Status Current problems with teeth and/or dentures?: No Does patient usually wear dentures?: No  CIWA:    COWS:     Musculoskeletal: Strength & Muscle Tone: within normal limits Gait & Station: normal Patient leans: [name]  Psychiatric Specialty Exam:  Presentation  General Appearance:  Disheveled  Eye Contact: Fair  Speech: Pressured  Speech Volume: Increased  Handedness: Right   Mood and Affect  Mood: Irritable;  Labile; Angry  Affect: Blunt; Labile   Thought Process  Thought Processes: Disorganized; Irrevelant  Descriptions of Associations:Loose  Orientation:Partial  Thought Content:Illogical; Rumination; Scattered; Tangential; Delusions  History of Schizophrenia/Schizoaffective disorder:Yes  Duration of Psychotic Symptoms:Greater than six months  Hallucinations:No data recorded Ideas of Reference:Percusatory; Paranoia  Suicidal Thoughts:No data recorded Homicidal Thoughts:No data recorded  Sensorium  Memory: Immediate Fair; Recent Fair; Remote Fair  Judgment: Impaired  Insight: Poor; Lacking   Executive Functions  Concentration: Poor  Attention Span: Poor  Recall: Poor  Fund of Knowledge: Poor  Language: Poor  Psychomotor Activity  Psychomotor Activity:No data recorded  Assets  Assets: Physical Health; Resilience; Housing; Social Support   Sleep  Sleep:No data recorded   Physical Exam: Physical Exam Vitals and nursing note reviewed.  Constitutional:      Appearance: Normal appearance.  HENT:     Head: Normocephalic and atraumatic.     Mouth/Throat:     Pharynx: Oropharynx is clear.  Eyes:     Pupils: Pupils are equal, round, and reactive to light.  Cardiovascular:     Rate and Rhythm: Normal rate and regular rhythm.  Pulmonary:     Effort: Pulmonary effort is normal.     Breath sounds: Normal breath sounds.  Abdominal:     General: Abdomen is flat.     Palpations: Abdomen is soft.  Musculoskeletal:        General: Normal range of motion.  Skin:    General: Skin is warm and dry.  Neurological:     General: No focal deficit present.     Mental Status: She is alert. Mental status is at baseline.  Psychiatric:        Attention and Perception: She is inattentive.        Mood and Affect: Mood normal. Affect is blunt.        Speech: Speech is delayed.        Thought Content: Thought content is paranoid.    Review of Systems   Constitutional: Negative.   HENT: Negative.    Eyes: Negative.   Respiratory: Negative.    Cardiovascular: Negative.   Gastrointestinal: Negative.   Musculoskeletal: Negative.   Skin: Negative.   Neurological: Negative.   Psychiatric/Behavioral: Negative.  Negative for depression.    Blood pressure 103/66, pulse (!) 56, temperature 98.4 F (36.9 C), temperature source Oral, resp. rate 18, height 5\' 4"  (1.626 m), weight 40.4 kg, SpO2 100 %. Body mass index is 15.28 kg/m.   Treatment Plan Summary: Medication management and Plan increase Seroquel dose to 400 mg and then we will hold it at that.  Supportive counseling and encouragement to the patient to keep participating on the ward  Alethia Berthold, MD 05/28/2022, 5:00 PM

## 2022-05-29 DIAGNOSIS — F251 Schizoaffective disorder, depressive type: Secondary | ICD-10-CM | POA: Diagnosis not present

## 2022-05-29 NOTE — Progress Notes (Signed)
Pt denies SI/HI/AVH and verbally agrees to approach staff if these become apparent or before harming themselves/others. Rates depression 0/10. Rates anxiety 0/10. Rates pain 0/10.  Scheduled medications administered to pt, per MD orders. RN provided support and encouragement to pt. Q15 min safety checks implemented and continued. Pt safe on the unit. RN will continue to monitor and intervene as needed. Problem: Education: Goal: Knowledge of General Education information will improve Description: Including pain rating scale, medication(s)/side effects and non-pharmacologic comfort measures Outcome: Progressing   Problem: Health Behavior/Discharge Planning: Goal: Ability to manage health-related needs will improve Outcome: Progressing    05/29/22 0800  Psych Admission Type (Psych Patients Only)  Admission Status Involuntary  Psychosocial Assessment  Patient Complaints None  Eye Contact Brief  Facial Expression Flat  Affect Flat  Speech Logical/coherent  Interaction Minimal  Motor Activity Slow  Appearance/Hygiene Unremarkable;In scrubs  Behavior Characteristics Cooperative;Appropriate to situation;Calm  Mood Pleasant  Thought Process  Coherency WDL  Content WDL  Delusions None reported or observed  Perception WDL  Hallucination None reported or observed  Judgment WDL  Confusion None  Danger to Self  Current suicidal ideation? Denies  Danger to Others  Danger to Others None reported or observed

## 2022-05-29 NOTE — [facility] Group Notes (Signed)
[facility] Group Notes:  (Nursing/MHT/Case Management/Adjunct)  Date:  05/29/2022  Time:  9:28 AM  Type of Therapy:   community meeting  Participation Level:  Did Not Attend    [name] 05/29/2022, 9:28 AM

## 2022-05-29 NOTE — Progress Notes (Signed)
[facility] MD Progress Note  05/29/2022 3:56 PM [name]  MRN:  [id number] Subjective: Patient has no new complaints.  Taking her medicine.  Still paranoid about her family. Principal Problem: Schizoaffective disorder, depressive type (HCC) Diagnosis: Principal Problem:   Schizoaffective disorder, depressive type (HCC)  Total Time spent with patient: 30 minutes  Past Psychiatric History: Past history of schizophrenia  Past Medical History:  Past Medical History:  Diagnosis Date   Anemia 10/02/2021   Non compliance w medication regimen    Schizophrenia [facility])     Past Surgical History:  Procedure Laterality Date   BIOPSY  09/25/2021   Procedure: BIOPSY;  Surgeon: Rachael Fee, MD;  Location: [facility];  Service: Gastroenterology;;   ESOPHAGOGASTRODUODENOSCOPY (EGD) WITH PROPOFOL [name] 09/25/2021   Procedure: ESOPHAGOGASTRODUODENOSCOPY (EGD) WITH PROPOFOL;  Surgeon: Rachael Fee, MD;  Location: [facility];  Service: Gastroenterology;  Laterality: [name];  With NGT placement   Family History: History reviewed. No pertinent family history. Family Psychiatric  History: See previous Social History:  Social History   Substance and Sexual Activity  Alcohol Use Not Currently   Comment: Refuses to disclose how much     Social History   Substance and Sexual Activity  Drug Use No   Comment: Refuses to answer    Social History   Socioeconomic History   Marital status: Single    Spouse name: Not on file   Number of children: Not on file   Years of education: Not on file   Highest education level: Not on file  Occupational History   Not on file  Tobacco Use   Smoking status: Every Day    Packs/day: 2.00    Types: Cigarettes   Smokeless tobacco: Never  Substance and Sexual Activity   Alcohol use: Not Currently    Comment: Refuses to disclose how much   Drug use: No    Comment: Refuses to answer   Sexual activity: Not Currently    Comment: refused to answer  Other Topics  Concern   Not on file  Social History Narrative   Not on file   Social Determinants of Health   Financial Resource Strain: Not on file  Food Insecurity: No Food Insecurity (04/08/2022)   Hunger Vital Sign    Worried About Running Out of Food in the Last Year: Never true    Ran Out of Food in the Last Year: Never true  Transportation Needs: No Transportation Needs (04/08/2022)   PRAPARE - Administrator, Civil Service (Medical): No    Lack of Transportation (Non-Medical): No  Physical Activity: Not on file  Stress: Not on file  Social Connections: Not on file   Additional Social History:                         Sleep: Fair  Appetite:  Fair  Current Medications: Current Facility-Administered Medications  Medication Dose Route Frequency Provider Last Rate Last Admin   acetaminophen (TYLENOL) tablet 650 mg  650 mg Oral Q6H PRN Adalaide Jaskolski T, MD       alum & mag hydroxide-simeth (MAALOX/MYLANTA) 200-200-20 MG/5ML suspension 30 mL  30 mL Oral Q4H PRN Naitik Hermann T, MD       haloperidol (HALDOL) tablet 5 mg  5 mg Oral BID Cody Oliger, [name], MD   5 mg at 05/29/22 0801   Or   haloperidol lactate (HALDOL) injection 5 mg  5 mg Intramuscular BID Abyan Cadman  T, MD       lithium carbonate capsule 600 mg  600 mg Oral QHS Destynee Stringfellow T, MD   600 mg at 05/28/22 2116   LORazepam (ATIVAN) tablet 1 mg  1 mg Oral BID Vin Yonke T, MD   1 mg at 05/29/22 0801   Or   LORazepam (ATIVAN) injection 1 mg  1 mg Intramuscular BID Sloan Takagi T, MD       magnesium hydroxide (MILK OF MAGNESIA) suspension 30 mL  30 mL Oral Daily PRN Jahquan Klugh T, MD       mirtazapine (REMERON) tablet 30 mg  30 mg Oral QHS Kayton Ripp T, MD   30 mg at 05/28/22 2116   multivitamin with minerals tablet 1 tablet  1 tablet Oral Daily Rikki Smestad, [name], MD   1 tablet at 05/29/22 0801   QUEtiapine (SEROQUEL) tablet 400 mg  400 mg Oral QHS Dijon Kohlman T, MD   400 mg at 05/28/22 2116   thiamine  (VITAMIN B1) tablet 100 mg  100 mg Oral Daily Chantay Whitelock, [name], MD   100 mg at 05/29/22 0801    Lab Results: No results found for this or any previous visit (from the past 48 hour(s)).  Blood Alcohol level:  Lab Results  Component Value Date   ETH <10 03/29/2022   ETH <10 09/12/2021    Metabolic Disorder Labs: Lab Results  Component Value Date   HGBA1C 5.6 10/21/2021   MPG 114 10/21/2021   MPG 111 10/02/2021   Lab Results  Component Value Date   PROLACTIN 13.6 04/16/2015   Lab Results  Component Value Date   CHOL 189 10/21/2021   TRIG 52 10/21/2021   HDL 50 10/21/2021   CHOLHDL 3.8 10/21/2021   VLDL 10 10/21/2021   LDLCALC 129 (H) 10/21/2021   LDLCALC 93 10/01/2020    Physical Findings: AIMS: Facial and Oral Movements Muscles of Facial Expression: None, normal Lips and Perioral Area: None, normal Jaw: None, normal Tongue: None, normal,Extremity Movements Upper (arms, wrists, hands, fingers): None, normal Lower (legs, knees, ankles, toes): None, normal, Trunk Movements Neck, shoulders, hips: None, normal, Overall Severity Severity of abnormal movements (highest score from questions above): None, normal Incapacitation due to abnormal movements: None, normal Patient's awareness of abnormal movements (rate only patient's report): No Awareness, Dental Status Current problems with teeth and/or dentures?: No Does patient usually wear dentures?: No  CIWA:    COWS:     Musculoskeletal: Strength & Muscle Tone: within normal limits Gait & Station: normal Patient leans: [name]  Psychiatric Specialty Exam:  Presentation  General Appearance:  Disheveled  Eye Contact: Fair  Speech: Pressured  Speech Volume: Increased  Handedness: Right   Mood and Affect  Mood: Irritable; Labile; Angry  Affect: Blunt; Labile   Thought Process  Thought Processes: Disorganized; Irrevelant  Descriptions of Associations:Loose  Orientation:Partial  Thought  Content:Illogical; Rumination; Scattered; Tangential; Delusions  History of Schizophrenia/Schizoaffective disorder:Yes  Duration of Psychotic Symptoms:Greater than six months  Hallucinations:No data recorded Ideas of Reference:Percusatory; Paranoia  Suicidal Thoughts:No data recorded Homicidal Thoughts:No data recorded  Sensorium  Memory: Immediate Fair; Recent Fair; Remote Fair  Judgment: Impaired  Insight: Poor; Lacking   Executive Functions  Concentration: Poor  Attention Span: Poor  Recall: Poor  Fund of Knowledge: Poor  Language: Poor   Psychomotor Activity  Psychomotor Activity:No data recorded  Assets  Assets: Physical Health; Resilience; Housing; Social Support   Sleep  Sleep:No data recorded   Physical Exam:  Physical Exam Vitals reviewed.  Constitutional:      Appearance: Normal appearance.  HENT:     Head: Normocephalic and atraumatic.     Mouth/Throat:     Pharynx: Oropharynx is clear.  Eyes:     Pupils: Pupils are equal, round, and reactive to light.  Cardiovascular:     Rate and Rhythm: Normal rate and regular rhythm.  Pulmonary:     Effort: Pulmonary effort is normal.     Breath sounds: Normal breath sounds.  Abdominal:     General: Abdomen is flat.     Palpations: Abdomen is soft.  Musculoskeletal:        General: Normal range of motion.  Skin:    General: Skin is warm and dry.  Neurological:     General: No focal deficit present.     Mental Status: She is alert. Mental status is at baseline.  Psychiatric:        Attention and Perception: She is inattentive.        Mood and Affect: Mood normal. Affect is blunt.        Speech: Speech is delayed.        Behavior: Behavior is slowed.        Thought Content: Thought content is paranoid.        Cognition and Memory: Cognition is impaired.    Review of Systems  Constitutional: Negative.   HENT: Negative.    Eyes: Negative.   Respiratory: Negative.    Cardiovascular:  Negative.   Gastrointestinal: Negative.   Musculoskeletal: Negative.   Skin: Negative.   Neurological: Negative.   Psychiatric/Behavioral: Negative.     Blood pressure 104/69, pulse 62, temperature 98.6 F (37 C), temperature source Oral, resp. rate 16, height 5\' 4"  (1.626 m), weight 40.4 kg, SpO2 98 %. Body mass index is 15.28 kg/m.   Treatment Plan Summary: Plan I have got her Seroquel dose up to 400 mg at night so we will stay there for now.  She appears to be tolerating it without excessive sedation or falls.  I am hoping that perhaps we will start to see some improvement in her paranoia now that could lead to discharge planning  , MD 05/29/2022, 3:56 PM

## 2022-05-29 NOTE — Group Note (Signed)
[facility] LCSW Group Therapy Note   Group Date: 05/29/2022 Start Time: 1300 End Time: 1350   Type of Therapy/Topic:  Group Therapy:  Emotion Regulation  Participation Level:  Did Not Attend   Mood:  Description of Group:    The purpose of this group is to assist patients in learning to regulate negative emotions and experience positive emotions. Patients will be guided to discuss ways in which they have been vulnerable to their negative emotions. These vulnerabilities will be juxtaposed with experiences of positive emotions or situations, and patients challenged to use positive emotions to combat negative ones. Special emphasis will be placed on coping with negative emotions in conflict situations, and patients will process healthy conflict resolution skills.  Therapeutic Goals: Patient will identify two positive emotions or experiences to reflect on in order to balance out negative emotions:  Patient will label two or more emotions that they find the most difficult to experience:  Patient will be able to demonstrate positive conflict resolution skills through discussion or role plays:   Summary of Patient Progress: Patient declined to attend group though encouraged to attend by this physician.  Therapeutic Modalities:   Cognitive Behavioral Therapy Feelings Identification Dialectical Behavioral Therapy   Harden Mo, LCSW

## 2022-05-29 NOTE — [facility] Counselor (Signed)
CSW attempted contact with Mallie Darting [phone] ext [phone]) from the [facility]. Contact was unable to be established but was able to leave a voicemail with contact information for follow through.   Vilma Meckel. Algis Greenhouse, MSW, LCSW, LCAS 05/29/2022 2:08 PM

## 2022-05-29 NOTE — [facility] Group Notes (Signed)
[facility] Group Notes:  (Nursing/MHT/Case Management/Adjunct)  Date:  05/29/2022  Time:  8:12 PM  Type of Therapy:   Wrap up  Participation Level:  Did Not Attend  Summary of Progress/Problems:  [name] 05/29/2022, 8:12 PM

## 2022-05-29 NOTE — Progress Notes (Signed)
Recreation Therapy Notes  Date: 05/29/2022   Time: 10:30 am      Location: Craft room    Behavioral response: [name]   Intervention Topic: Values    Discussion/Intervention: Patient refused to attend group.    Clinical Observations/Feedback:  Patient refused to attend group.    Olof Marcil LRT/CTRS          Joclynn Lumb 05/29/2022 10:41 AM

## 2022-05-30 DIAGNOSIS — F251 Schizoaffective disorder, depressive type: Secondary | ICD-10-CM | POA: Diagnosis not present

## 2022-05-30 NOTE — Plan of Care (Signed)
Pt is cooperative and calm, flat affect, no complaints of physical symptoms, no c/o pain or psychological distress. Denies SI, HI, AVH.  Pt remains isolative but compliant with meds and pleasant.  Pt expresses that she working on discharge and that that remains her goal.  Continued monitoring for safety via q 15 minute rounds.

## 2022-05-30 NOTE — Progress Notes (Signed)
[facility] MD Progress Note  05/30/2022 3:30 PM [name]  MRN:  [id number] Subjective: [name] is seen on rounds.  No changes.  She does not have any complaints.  No side effects from her medication.  Principal Problem: Schizoaffective disorder, depressive type (HCC) Diagnosis: Principal Problem:   Schizoaffective disorder, depressive type (HCC)  Total Time spent with patient: 15 minutes  Past Psychiatric History: Schizoaffective disorder  Past Medical History:  Past Medical History:  Diagnosis Date   Anemia 10/02/2021   Non compliance w medication regimen    Schizophrenia [facility])     Past Surgical History:  Procedure Laterality Date   BIOPSY  09/25/2021   Procedure: BIOPSY;  Surgeon: Rachael Fee, MD;  Location: [facility];  Service: Gastroenterology;;   ESOPHAGOGASTRODUODENOSCOPY (EGD) WITH PROPOFOL [name] 09/25/2021   Procedure: ESOPHAGOGASTRODUODENOSCOPY (EGD) WITH PROPOFOL;  Surgeon: Rachael Fee, MD;  Location: [facility];  Service: Gastroenterology;  Laterality: [name];  With NGT placement   Family History: History reviewed. No pertinent family history. Family Psychiatric  History: Unremarkable Social History:  Social History   Substance and Sexual Activity  Alcohol Use Not Currently   Comment: Refuses to disclose how much     Social History   Substance and Sexual Activity  Drug Use No   Comment: Refuses to answer    Social History   Socioeconomic History   Marital status: Single    Spouse name: Not on file   Number of children: Not on file   Years of education: Not on file   Highest education level: Not on file  Occupational History   Not on file  Tobacco Use   Smoking status: Every Day    Packs/day: 2.00    Types: Cigarettes   Smokeless tobacco: Never  Substance and Sexual Activity   Alcohol use: Not Currently    Comment: Refuses to disclose how much   Drug use: No    Comment: Refuses to answer   Sexual activity: Not Currently    Comment: refused to  answer  Other Topics Concern   Not on file  Social History Narrative   Not on file   Social Determinants of Health   Financial Resource Strain: Not on file  Food Insecurity: No Food Insecurity (04/08/2022)   Hunger Vital Sign    Worried About Running Out of Food in the Last Year: Never true    Ran Out of Food in the Last Year: Never true  Transportation Needs: No Transportation Needs (04/08/2022)   PRAPARE - Administrator, Civil Service (Medical): No    Lack of Transportation (Non-Medical): No  Physical Activity: Not on file  Stress: Not on file  Social Connections: Not on file   Additional Social History:                         Sleep: Good  Appetite:  Good  Current Medications: Current Facility-Administered Medications  Medication Dose Route Frequency Provider Last Rate Last Admin   acetaminophen (TYLENOL) tablet 650 mg  650 mg Oral Q6H PRN [name], [name], MD       alum & mag hydroxide-simeth (MAALOX/MYLANTA) 200-200-20 MG/5ML suspension 30 mL  30 mL Oral Q4H PRN [name], [name], MD       haloperidol (HALDOL) tablet 5 mg  5 mg Oral BID [name], [name], MD   5 mg at 05/30/22 9233   Or   haloperidol lactate (HALDOL) injection 5 mg  5  mg Intramuscular BID [name], [name], MD       lithium carbonate capsule 600 mg  600 mg Oral QHS [name], [name], MD   600 mg at 05/29/22 2104   LORazepam (ATIVAN) tablet 1 mg  1 mg Oral BID [name], [name], MD   1 mg at 05/30/22 6073   Or   LORazepam (ATIVAN) injection 1 mg  1 mg Intramuscular BID [name], [name], MD       magnesium hydroxide (MILK OF MAGNESIA) suspension 30 mL  30 mL Oral Daily PRN [name], [name], MD       mirtazapine (REMERON) tablet 30 mg  30 mg Oral QHS [name], [name], MD   30 mg at 05/29/22 2104   multivitamin with minerals tablet 1 tablet  1 tablet Oral Daily [name], [name], MD   1 tablet at 05/30/22 7106   QUEtiapine (SEROQUEL) tablet 400 mg  400 mg Oral QHS [name], [name], MD   400 mg at  05/29/22 2104   thiamine (VITAMIN B1) tablet 100 mg  100 mg Oral Daily [name], [name], MD   100 mg at 05/30/22 2694    Lab Results: No results found for this or any previous visit (from the past 48 hour(s)).  Blood Alcohol level:  Lab Results  Component Value Date   ETH <10 03/29/2022   ETH <10 09/12/2021    Metabolic Disorder Labs: Lab Results  Component Value Date   HGBA1C 5.6 10/21/2021   MPG 114 10/21/2021   MPG 111 10/02/2021   Lab Results  Component Value Date   PROLACTIN 13.6 04/16/2015   Lab Results  Component Value Date   CHOL 189 10/21/2021   TRIG 52 10/21/2021   HDL 50 10/21/2021   CHOLHDL 3.8 10/21/2021   VLDL 10 10/21/2021   LDLCALC 129 (H) 10/21/2021   LDLCALC 93 10/01/2020    Physical Findings: AIMS: Facial and Oral Movements Muscles of Facial Expression: None, normal Lips and Perioral Area: None, normal Jaw: None, normal Tongue: None, normal,Extremity Movements Upper (arms, wrists, hands, fingers): None, normal Lower (legs, knees, ankles, toes): None, normal, Trunk Movements Neck, shoulders, hips: None, normal, Overall Severity Severity of abnormal movements (highest score from questions above): None, normal Incapacitation due to abnormal movements: None, normal Patient's awareness of abnormal movements (rate only patient's report): No Awareness, Dental Status Current problems with teeth and/or dentures?: No Does patient usually wear dentures?: No  CIWA:    COWS:     Musculoskeletal: Strength & Muscle Tone: within normal limits Gait & Station: normal Patient leans: [name]  Psychiatric Specialty Exam:  Presentation  General Appearance:  Disheveled  Eye Contact: Fair  Speech: Pressured  Speech Volume: Increased  Handedness: Right   Mood and Affect  Mood: Irritable; Labile; Angry  Affect: Blunt; Labile   Thought Process  Thought Processes: Disorganized; Irrevelant  Descriptions of  Associations:Loose  Orientation:Partial  Thought Content:Illogical; Rumination; Scattered; Tangential; Delusions  History of Schizophrenia/Schizoaffective disorder:Yes  Duration of Psychotic Symptoms:Greater than six months  Hallucinations:No data recorded Ideas of Reference:Percusatory; Paranoia  Suicidal Thoughts:No data recorded Homicidal Thoughts:No data recorded  Sensorium  Memory: Immediate Fair; Recent Fair; Remote Fair  Judgment: Impaired  Insight: Poor; Lacking   Executive Functions  Concentration: Poor  Attention Span: Poor  Recall: Poor  Fund of Knowledge: Poor  Language: Poor   Psychomotor Activity  Psychomotor Activity:No data recorded  Assets  Assets: Physical Health; Resilience; Housing; Social Support   Sleep  Sleep:No data  recorded    Blood pressure 107/68, pulse 63, temperature 97.9 F (36.6 C), temperature source Oral, resp. rate 18, height 5\' 4"  (1.626 m), weight 40.4 kg, SpO2 99 %. Body mass index is 15.28 kg/m.   Treatment Plan Summary: Daily contact with patient to assess and evaluate symptoms and progress in treatment, Medication management, and Plan continue current medications.  Parks Ranger, DO 05/30/2022, 3:30 PM

## 2022-05-30 NOTE — Progress Notes (Signed)
Patient alert and oriented x 4, affect is flat , thoughts are organized and coherent, there was no bizarre behavior noted she was complaint with medication and isolated to her room most of the shift. Patient was offered emotional support, 15 minutes safety checks maintained.  

## 2022-05-30 NOTE — Plan of Care (Signed)
Pt denies anxiety/depression at this time. Pt denies SI/HI/AVH or pain at this time. Pt is calm and cooperative. Pt is medication compliant. Pt provided with support and encouragement. Pt monitored q15 minutes for safety per unit policy. Plan of care ongoing.   Problem: Education: Goal: Knowledge of General Education information will improve Description: Including pain rating scale, medication(s)/side effects and non-pharmacologic comfort measures Outcome: Progressing   Problem: Coping: Goal: Level of anxiety will decrease Outcome: Progressing   

## 2022-05-31 DIAGNOSIS — F251 Schizoaffective disorder, depressive type: Secondary | ICD-10-CM | POA: Diagnosis not present

## 2022-05-31 NOTE — BH IP Treatment Plan (Signed)
Interdisciplinary Treatment and Diagnostic Plan Update  05/31/2022 Time of Session: 11:00am [name] MRN: [id number]  Principal Diagnosis: Schizoaffective disorder, depressive type (HCC)  Secondary Diagnoses: Principal Problem:   Schizoaffective disorder, depressive type (HCC)   Current Medications:  Current Facility-Administered Medications  Medication Dose Route Frequency Provider Last Rate Last Admin   acetaminophen (TYLENOL) tablet 650 mg  650 mg Oral Q6H PRN [name], [name], MD       alum & mag hydroxide-simeth (MAALOX/MYLANTA) 200-200-20 MG/5ML suspension 30 mL  30 mL Oral Q4H PRN [name], [name], MD       haloperidol (HALDOL) tablet 5 mg  5 mg Oral BID [name], [name], MD   5 mg at 05/31/22 9485   Or   haloperidol lactate (HALDOL) injection 5 mg  5 mg Intramuscular BID [name], [name], MD       lithium carbonate capsule 600 mg  600 mg Oral QHS [name], [name], MD   600 mg at 05/30/22 2122   LORazepam (ATIVAN) tablet 1 mg  1 mg Oral BID [name], [name], MD   1 mg at 05/31/22 0815   Or   LORazepam (ATIVAN) injection 1 mg  1 mg Intramuscular BID [name], [name], MD       magnesium hydroxide (MILK OF MAGNESIA) suspension 30 mL  30 mL Oral Daily PRN [name], [name], MD       mirtazapine (REMERON) tablet 30 mg  30 mg Oral QHS [name], [name], MD   30 mg at 05/30/22 2122   multivitamin with minerals tablet 1 tablet  1 tablet Oral Daily [name], [name], MD   1 tablet at 05/31/22 0815   QUEtiapine (SEROQUEL) tablet 400 mg  400 mg Oral QHS [name], [name], MD   400 mg at 05/30/22 2121   thiamine (VITAMIN B1) tablet 100 mg  100 mg Oral Daily [name], [name], MD   100 mg at 05/31/22 0815   PTA Medications: Medications Prior to Admission  Medication Sig Dispense Refill Last Dose   clonazePAM (KLONOPIN) 0.5 MG tablet Take 0.5 tablets (0.25 mg total) by mouth 2 (two) times daily. (Patient not taking: Reported on 03/30/2022) 60 tablet 0    haloperidol (HALDOL) 5 MG tablet Take 1 tablet (5  mg total) by mouth 2 (two) times daily. (Patient not taking: Reported on 03/30/2022) 60 tablet 3    midodrine (PROAMATINE) 2.5 MG tablet Take 1 tablet (2.5 mg total) by mouth 3 (three) times daily with meals. (Patient not taking: Reported on 03/30/2022)  0    mirtazapine (REMERON) 15 MG tablet Take 0.5 tablets (7.5 mg total) by mouth at bedtime. (Patient not taking: Reported on 03/30/2022) 30 tablet 2    Multiple Vitamin (MULTIVITAMIN WITH MINERALS) TABS tablet Take 1 tablet by mouth daily. (Patient not taking: Reported on 03/30/2022)      OLANZapine (ZYPREXA) 5 MG tablet Take 1 tablet (5 mg total) by mouth at bedtime. (Patient not taking: Reported on 03/30/2022) 30 tablet 2    thiamine 100 MG tablet Take 1 tablet (100 mg total) by mouth daily. (Patient not taking: Reported on 03/30/2022)       Patient Stressors: Marital or family conflict    Patient Strengths: Supportive family/friends   Treatment Modalities: Medication Management, Group therapy, Case management,  1 to 1 session with clinician, Psychoeducation, Recreational therapy.   Physician Treatment Plan for Primary Diagnosis: Schizoaffective disorder, depressive type (HCC) Long Term Goal(s): Improvement in symptoms so as ready for  discharge   Short Term Goals: Compliance with prescribed medications will improve Ability to verbalize feelings will improve Ability to demonstrate self-control will improve Ability to identify and develop effective coping behaviors will improve  Medication Management: Evaluate patient's response, side effects, and tolerance of medication regimen.  Therapeutic Interventions: 1 to 1 sessions, Unit Group sessions and Medication administration.  Evaluation of Outcomes: Not Progressing  Physician Treatment Plan for Secondary Diagnosis: Principal Problem:   Schizoaffective disorder, depressive type (HCC)  Long Term Goal(s): Improvement in symptoms so as ready for discharge   Short Term Goals: Compliance with  prescribed medications will improve Ability to verbalize feelings will improve Ability to demonstrate self-control will improve Ability to identify and develop effective coping behaviors will improve     Medication Management: Evaluate patient's response, side effects, and tolerance of medication regimen.  Therapeutic Interventions: 1 to 1 sessions, Unit Group sessions and Medication administration.  Evaluation of Outcomes: Not Progressing   RN Treatment Plan for Primary Diagnosis: Schizoaffective disorder, depressive type (HCC) Long Term Goal(s): Knowledge of disease and therapeutic regimen to maintain health will improve  Short Term Goals: Ability to remain free from injury will improve, Ability to verbalize frustration and anger appropriately will improve, Ability to demonstrate self-control, Ability to participate in decision making will improve, Ability to verbalize feelings will improve, Ability to identify and develop effective coping behaviors will improve, and Compliance with prescribed medications will improve  Medication Management: RN will administer medications as ordered by provider, will assess and evaluate patient's response and provide education to patient for prescribed medication. RN will report any adverse and/or side effects to prescribing provider.  Therapeutic Interventions: 1 on 1 counseling sessions, Psychoeducation, Medication administration, Evaluate responses to treatment, Monitor vital signs and CBGs as ordered, Perform/monitor CIWA, COWS, AIMS and Fall Risk screenings as ordered, Perform wound care treatments as ordered.  Evaluation of Outcomes: Not Progressing   LCSW Treatment Plan for Primary Diagnosis: Schizoaffective disorder, depressive type (HCC) Long Term Goal(s): Safe transition to appropriate next level of care at discharge, Engage patient in therapeutic group addressing interpersonal concerns.  Short Term Goals: Engage patient in aftercare planning  with referrals and resources, Increase social support, Increase ability to appropriately verbalize feelings, Increase emotional regulation, Identify triggers associated with mental health/substance abuse issues, and Increase skills for wellness and recovery  Therapeutic Interventions: Assess for all discharge needs, 1 to 1 time with Social worker, Explore available resources and support systems, Assess for adequacy in community support network, Educate family and significant other(s) on suicide prevention, Complete Psychosocial Assessment, Interpersonal group therapy.  Evaluation of Outcomes: Not Progressing       Glenis Smoker, LCSW Last edited by Glenis Smoker, LCSW on 05/25/2022  9:09 AM EST       Interdisciplinary Treatment and Diagnostic Plan Update   05/25/2022 Time of Session: 08:30 [name] MRN: [id number]   Principal Diagnosis: Schizoaffective disorder, depressive type (HCC)   Secondary Diagnoses: Principal Problem:   Schizoaffective disorder, depressive type (HCC)     Current Medications:           Current Facility-Administered Medications  Medication Dose Route Frequency Provider Last Rate Last Admin   acetaminophen (TYLENOL) tablet 650 mg  650 mg Oral Q6H PRN [name], [name], MD       alum & mag hydroxide-simeth (MAALOX/MYLANTA) 200-200-20 MG/5ML suspension 30 mL  30 mL Oral Q4H PRN [name], [name], MD       haloperidol (HALDOL) tablet 10 mg  10 mg Oral BID [name], Jackquline DenmarkJohn T, MD   10 mg at 05/25/22 [id number]    Or   haloperidol lactate (HALDOL) injection 5 mg  5 mg Intramuscular BID [name], [name], MD       lithium carbonate capsule 600 mg  600 mg Oral QHS [name], [name], MD   600 mg at 05/24/22 2123   LORazepam (ATIVAN) tablet 1 mg  1 mg Oral BID [name], [name], MD   1 mg at 05/25/22 [id number]    Or   LORazepam (ATIVAN) injection 1 mg  1 mg Intramuscular BID [name], [name], MD       magnesium hydroxide (MILK OF MAGNESIA) suspension 30 mL  30 mL Oral Daily PRN [name], John  T, MD       mirtazapine (REMERON) tablet 30 mg  30 mg Oral QHS [name], [name], MD   30 mg at 05/24/22 2124   multivitamin with minerals tablet 1 tablet  1 tablet Oral Daily [name], Jackquline DenmarkJohn T, MD   1 tablet at 05/25/22 0755   OLANZapine (ZYPREXA) tablet 30 mg  30 mg Oral QHS [name], [name], MD   30 mg at 05/24/22 2124   thiamine (VITAMIN B1) tablet 100 mg  100 mg Oral Daily [name], [name], MD   100 mg at 05/25/22 0755    PTA Medications:        Medications Prior to Admission  Medication Sig Dispense Refill Last Dose   clonazePAM (KLONOPIN) 0.5 MG tablet Take 0.5 tablets (0.25 mg total) by mouth 2 (two) times daily. (Patient not taking: Reported on 03/30/2022) 60 tablet 0     haloperidol (HALDOL) 5 MG tablet Take 1 tablet (5 mg total) by mouth 2 (two) times daily. (Patient not taking: Reported on 03/30/2022) 60 tablet 3     midodrine (PROAMATINE) 2.5 MG tablet Take 1 tablet (2.5 mg total) by mouth 3 (three) times daily with meals. (Patient not taking: Reported on 03/30/2022)   0     mirtazapine (REMERON) 15 MG tablet Take 0.5 tablets (7.5 mg total) by mouth at bedtime. (Patient not taking: Reported on 03/30/2022) 30 tablet 2     Multiple Vitamin (MULTIVITAMIN WITH MINERALS) TABS tablet Take 1 tablet by mouth daily. (Patient not taking: Reported on 03/30/2022)         OLANZapine (ZYPREXA) 5 MG tablet Take 1 tablet (5 mg total) by mouth at bedtime. (Patient not taking: Reported on 03/30/2022) 30 tablet 2     thiamine 100 MG tablet Take 1 tablet (100 mg total) by mouth daily. (Patient not taking: Reported on 03/30/2022)            Patient Stressors: Marital or family conflict     Patient Strengths: Supportive family/friends    Treatment Modalities: Medication Management, Group therapy, Case management,  1 to 1 session with clinician, Psychoeducation, Recreational therapy.     Physician Treatment Plan for Primary Diagnosis: Schizoaffective disorder, depressive type (HCC) Long Term Goal(s):  Improvement in symptoms so as ready for discharge    Short Term Goals: Compliance with prescribed medications will improve Ability to verbalize feelings will improve Ability to demonstrate self-control will improve Ability to identify and develop effective coping behaviors will improve   Medication Management: Evaluate patient's response, side effects, and tolerance of medication regimen.   Therapeutic Interventions: 1 to 1 sessions, Unit Group sessions and Medication administration.   Evaluation of Outcomes: Progressing   Physician Treatment Plan for Secondary Diagnosis: Principal Problem:  Schizoaffective disorder, depressive type (HCC)   Long Term Goal(s): Improvement in symptoms so as ready for discharge    Short Term Goals: Compliance with prescribed medications will improve Ability to verbalize feelings will improve Ability to demonstrate self-control will improve Ability to identify and develop effective coping behaviors will improve      Medication Management: Evaluate patient's response, side effects, and tolerance of medication regimen.   Therapeutic Interventions: 1 to 1 sessions, Unit Group sessions and Medication administration.   Evaluation of Outcomes: Progressing     RN Treatment Plan for Primary Diagnosis: Schizoaffective disorder, depressive type (HCC) Long Term Goal(s): Knowledge of disease and therapeutic regimen to maintain health will improve   Short Term Goals: Ability to remain free from injury will improve, Ability to verbalize frustration and anger appropriately will improve, Ability to demonstrate self-control, Ability to participate in decision making will improve, Ability to verbalize feelings will improve, Ability to disclose and discuss suicidal ideas, Ability to identify and develop effective coping behaviors will improve, and Compliance with prescribed medications will improve   Medication Management: RN will administer medications as ordered by  provider, will assess and evaluate patient's response and provide education to patient for prescribed medication. RN will report any adverse and/or side effects to prescribing provider.   Therapeutic Interventions: 1 on 1 counseling sessions, Psychoeducation, Medication administration, Evaluate responses to treatment, Monitor vital signs and CBGs as ordered, Perform/monitor CIWA, COWS, AIMS and Fall Risk screenings as ordered, Perform wound care treatments as ordered.   Evaluation of Outcomes: Progressing     LCSW Treatment Plan for Primary Diagnosis: Schizoaffective disorder, depressive type (HCC) Long Term Goal(s): Safe transition to appropriate next level of care at discharge, Engage patient in therapeutic group addressing interpersonal concerns.   Short Term Goals: Engage patient in aftercare planning with referrals and resources, Increase social support, Increase ability to appropriately verbalize feelings, Increase emotional regulation, Facilitate acceptance of mental health diagnosis and concerns, and Increase skills for wellness and recovery   Therapeutic Interventions: Assess for all discharge needs, 1 to 1 time with Social worker, Explore available resources and support systems, Assess for adequacy in community support network, Educate family and significant other(s) on suicide prevention, Complete Psychosocial Assessment, Interpersonal group therapy.   Evaluation of Outcomes: Progressing     Progress in Treatment: Attending groups: No. Participating in groups: No. Taking medication as prescribed: Yes. Toleration medication: Yes. Family/Significant other contact made: Yes, individual(s) contacted:  guardian/sister, [name] Minor-Lennon. Patient understands diagnosis: Yes. Discussing patient identified problems/goals with staff: Yes. Medical problems stabilized or resolved: Yes. Denies suicidal/homicidal ideation: Yes. Issues/concerns per patient self-inventory: No. Other:  none.   New problem(s) identified: No, Describe:  none identified. Update 04/15/22: No changes at this time. Update 04/20/22: No changes at this time. Update 04/25/22: No changes at this time. Update 05/01/22: No changes at this time. 05/05/22 Update: No changes at this time. 05/10/22 Update: No changes at this time. 05/15/22 Update: No changes at this time. 05/21/22 Update: No changes at this time. 05/25/22 update: No changes at this time. 05/30/2022 update: No changes at this time.    New Short Term/Long Term Goal(s): elimination of symptoms of psychosis, medication management for mood stabilization; elimination of SI thoughts; development of comprehensive mental wellness plan. Update 04/15/22: No changes at this time. Update 04/20/22: No changes at this time. Update 04/25/22: No changes at this time. Update 05/01/22: No changes at this time. 05/05/22 Update: No changes at this time. 05/10/22 Update: No changes  at this time. 05/15/22 Update: No changes at this time. 05/21/22 Update: No changes at this time. 05/25/22 update: No changes at this time. 05/30/2022 update: No changes at this time.    Patient Goals:  "Nothing I want to work on. I want to be discharged." Update 04/15/22: No changes at this time. Update 04/20/22: No changes at this time. Update 04/25/22: No changes at this time. Update 05/01/22: No changes at this time. 05/05/22 Update: No changes at this time. 05/10/22 Update: No changes at this time. 05/15/22 Update: No changes at this time. 05/21/22 Update: No changes at this time. 05/25/22 update: No changes at this time. 05/30/2022 update: No changes at this time.    Discharge Plan or Barriers: CSW will assist pt with development of an appropriate aftercare/discharge plan. Update 04/15/22: Pt has been unwilling to engage with the treatment team around discharge. Update 04/20/22: No changes at this time.   Update 04/25/22: Patient will be returning to live with her sister. Pt will receive outpatient services  through [facility]. Update 05/01/22: No changes at this time. 05/05/22 Update: No changes at this time. 05/10/22 Update: will return to her [facility] team. 05/15/22 Update: No changes at this time. 05/21/22 Update: No changes at this time. 05/25/22 update: No changes at this time. 05/30/2022 update: No changes at this time.    Reason for Continuation of Hospitalization: Aggression Medication stabilization   Estimated Length of Stay: 1-7 days Update 04/15/22: No changes at this time. Update 04/20/22: TBD Update 04/25/22: TBD Update 05/01/22: No changes at this time. 05/05/22 Update: No changes at this time. 05/10/22 Update: No changes at this time. 05/15/22 Update: No changes at this time. 05/21/22 Update: No changes at this time. 05/25/22 update: No changes at this time. 05/30/2022 update: No changes at this time.      Last 3 [place] Suicide Severity Risk Score: Flowsheet Row Admission (Current) from 04/08/2022 in [facility] INPATIENT BEHAVIORAL MEDICINE Admission (Discharged) from 10/17/2021 in [facility] BEHAVIORAL MEDICINE Admission (Discharged) from 10/02/2021 in [facility] REGIONAL CARDIAC MED PCU  C-SSRS RISK CATEGORY No Risk No Risk No Risk       Last PHQ 2/9 Scores:     No data to display          Scribe for Treatment Team: Otelia Santee, LCSW 05/31/2022 11:09 AM

## 2022-05-31 NOTE — Progress Notes (Signed)
[facility] MD Progress Note  05/31/2022 2:15 PM [name]  MRN:  [id number] Subjective: [name] is seen on rounds.  She does not have any complaints.  She has been compliant with medications and no side effects.  No other issues.  Principal Problem: Schizoaffective disorder, depressive type (HCC) Diagnosis: Principal Problem:   Schizoaffective disorder, depressive type (HCC)  Total Time spent with patient: 15 minutes  Past Psychiatric History: Schizoaffective disorder  Past Medical History:  Past Medical History:  Diagnosis Date   Anemia 10/02/2021   Non compliance w medication regimen    Schizophrenia [facility])     Past Surgical History:  Procedure Laterality Date   BIOPSY  09/25/2021   Procedure: BIOPSY;  Surgeon: Rachael Fee, MD;  Location: [facility];  Service: Gastroenterology;;   ESOPHAGOGASTRODUODENOSCOPY (EGD) WITH PROPOFOL [name] 09/25/2021   Procedure: ESOPHAGOGASTRODUODENOSCOPY (EGD) WITH PROPOFOL;  Surgeon: Rachael Fee, MD;  Location: [facility];  Service: Gastroenterology;  Laterality: [name];  With NGT placement   Family History: History reviewed. No pertinent family history. Family Psychiatric  History: Unremarkable Social History:  Social History   Substance and Sexual Activity  Alcohol Use Not Currently   Comment: Refuses to disclose how much     Social History   Substance and Sexual Activity  Drug Use No   Comment: Refuses to answer    Social History   Socioeconomic History   Marital status: Single    Spouse name: Not on file   Number of children: Not on file   Years of education: Not on file   Highest education level: Not on file  Occupational History   Not on file  Tobacco Use   Smoking status: Every Day    Packs/day: 2.00    Types: Cigarettes   Smokeless tobacco: Never  Substance and Sexual Activity   Alcohol use: Not Currently    Comment: Refuses to disclose how much   Drug use: No    Comment: Refuses to answer   Sexual activity: Not  Currently    Comment: refused to answer  Other Topics Concern   Not on file  Social History Narrative   Not on file   Social Determinants of Health   Financial Resource Strain: Not on file  Food Insecurity: No Food Insecurity (04/08/2022)   Hunger Vital Sign    Worried About Running Out of Food in the Last Year: Never true    Ran Out of Food in the Last Year: Never true  Transportation Needs: No Transportation Needs (04/08/2022)   PRAPARE - Administrator, Civil Service (Medical): No    Lack of Transportation (Non-Medical): No  Physical Activity: Not on file  Stress: Not on file  Social Connections: Not on file   Additional Social History:                         Sleep: Good  Appetite:  Good  Current Medications: Current Facility-Administered Medications  Medication Dose Route Frequency Provider Last Rate Last Admin   acetaminophen (TYLENOL) tablet 650 mg  650 mg Oral Q6H PRN [name], [name], MD       alum & mag hydroxide-simeth (MAALOX/MYLANTA) 200-200-20 MG/5ML suspension 30 mL  30 mL Oral Q4H PRN [name], [name], MD       haloperidol (HALDOL) tablet 5 mg  5 mg Oral BID [name], [name], MD   5 mg at 05/31/22 0815   Or   haloperidol lactate (HALDOL)  injection 5 mg  5 mg Intramuscular BID [name], [name], MD       lithium carbonate capsule 600 mg  600 mg Oral QHS [name], [name], MD   600 mg at 05/30/22 2122   LORazepam (ATIVAN) tablet 1 mg  1 mg Oral BID [name], [name], MD   1 mg at 05/31/22 0815   Or   LORazepam (ATIVAN) injection 1 mg  1 mg Intramuscular BID [name], [name], MD       magnesium hydroxide (MILK OF MAGNESIA) suspension 30 mL  30 mL Oral Daily PRN [name], [name], MD       mirtazapine (REMERON) tablet 30 mg  30 mg Oral QHS [name], [name], MD   30 mg at 05/30/22 2122   multivitamin with minerals tablet 1 tablet  1 tablet Oral Daily [name], [name], MD   1 tablet at 05/31/22 0815   QUEtiapine (SEROQUEL) tablet 400 mg  400 mg Oral QHS  [name], [name], MD   400 mg at 05/30/22 2121   thiamine (VITAMIN B1) tablet 100 mg  100 mg Oral Daily [name], [name], MD   100 mg at 05/31/22 0815    Lab Results: No results found for this or any previous visit (from the past 48 hour(s)).  Blood Alcohol level:  Lab Results  Component Value Date   ETH <10 03/29/2022   ETH <10 09/12/2021    Metabolic Disorder Labs: Lab Results  Component Value Date   HGBA1C 5.6 10/21/2021   MPG 114 10/21/2021   MPG 111 10/02/2021   Lab Results  Component Value Date   PROLACTIN 13.6 04/16/2015   Lab Results  Component Value Date   CHOL 189 10/21/2021   TRIG 52 10/21/2021   HDL 50 10/21/2021   CHOLHDL 3.8 10/21/2021   VLDL 10 10/21/2021   LDLCALC 129 (H) 10/21/2021   LDLCALC 93 10/01/2020    Physical Findings: AIMS: Facial and Oral Movements Muscles of Facial Expression: None, normal Lips and Perioral Area: None, normal Jaw: None, normal Tongue: None, normal,Extremity Movements Upper (arms, wrists, hands, fingers): None, normal Lower (legs, knees, ankles, toes): None, normal, Trunk Movements Neck, shoulders, hips: None, normal, Overall Severity Severity of abnormal movements (highest score from questions above): None, normal Incapacitation due to abnormal movements: None, normal Patient's awareness of abnormal movements (rate only patient's report): No Awareness, Dental Status Current problems with teeth and/or dentures?: No Does patient usually wear dentures?: No  CIWA:    COWS:     Musculoskeletal: Strength & Muscle Tone: within normal limits Gait & Station: normal Patient leans: [name]  Psychiatric Specialty Exam:  Presentation  General Appearance:  Disheveled  Eye Contact: Fair  Speech: Pressured  Speech Volume: Increased  Handedness: Right   Mood and Affect  Mood: Irritable; Labile; Angry  Affect: Blunt; Labile   Thought Process  Thought Processes: Disorganized; Irrevelant  Descriptions of  Associations:Loose  Orientation:Partial  Thought Content:Illogical; Rumination; Scattered; Tangential; Delusions  History of Schizophrenia/Schizoaffective disorder:Yes  Duration of Psychotic Symptoms:Greater than six months  Hallucinations:No data recorded Ideas of Reference:Percusatory; Paranoia  Suicidal Thoughts:No data recorded Homicidal Thoughts:No data recorded  Sensorium  Memory: Immediate Fair; Recent Fair; Remote Fair  Judgment: Impaired  Insight: Poor; Lacking   Executive Functions  Concentration: Poor  Attention Span: Poor  Recall: Poor  Fund of Knowledge: Poor  Language: Poor   Psychomotor Activity  Psychomotor Activity:No data recorded  Assets  Assets: Physical Health; Resilience; Housing; Social Support  Sleep  Sleep:No data recorded    Blood pressure 111/70, pulse 64, temperature 98.5 F (36.9 C), temperature source Oral, resp. rate 18, height 5\' 4"  (1.626 m), weight 40.4 kg, SpO2 100 %. Body mass index is 15.28 kg/m.   Treatment Plan Summary: Daily contact with patient to assess and evaluate symptoms and progress in treatment, Medication management, and Plan continue current medications.  [name] , DO 05/31/2022, 2:15 PM

## 2022-05-31 NOTE — Plan of Care (Signed)
Pt is calm and cooperative, denies HI SI AVH, Denies physical distress or pain.  Pt is compliant with medications and frequently isolates in room.  Continue monitoring for safety via Q15 min checks

## 2022-05-31 NOTE — Progress Notes (Signed)
Patient was alert and oriented, denies SI/HI/AVH, pain, depression and anxiety. Patient was up and about, cooperative with care and compliant with medication. Required no prns. Support and reassurance provided. Will continue with the plan of care.   Problem: Education: Goal: Knowledge of the prescribed therapeutic regimen will improve Outcome: Progressing   Problem: Role Relationship: Goal: Ability to communicate needs accurately will improve Outcome: Progressing     05/31/22 2100  Psych Admission Type (Psych Patients Only)  Admission Status Involuntary  Psychosocial Assessment  Patient Complaints None  Eye Contact Brief  Facial Expression Flat  Affect Flat  Speech Logical/coherent  Interaction Minimal  Motor Activity Slow  Appearance/Hygiene Unremarkable  Behavior Characteristics Cooperative  Mood Pleasant  Thought Process  Coherency WDL  Content WDL  Delusions None reported or observed  Perception WDL  Hallucination None reported or observed  Judgment WDL  Confusion None  Danger to Self  Current suicidal ideation? Denies  Danger to Others  Danger to Others None reported or observed

## 2022-06-01 DIAGNOSIS — F251 Schizoaffective disorder, depressive type: Secondary | ICD-10-CM | POA: Diagnosis not present

## 2022-06-01 IMAGING — CT CT MAXILLOFACIAL W/O CM
3 series · 14 of 47 positions shown, 16 images · non-contrast
Comparison: None.

CLINICAL DATA: Fall, laceration under RIGHT eye.

EXAM:
CT HEAD WITHOUT CONTRAST
CT MAXILLOFACIAL WITHOUT CONTRAST
CT CERVICAL SPINE WITHOUT CONTRAST
TECHNIQUE: Multidetector CT imaging of the head, cervical spine, and
maxillofacial structures were performed using the standard protocol
without intravenous contrast. Multiplanar CT image reconstructions
of the cervical spine and maxillofacial structures were also
generated.

[Series 3: max soft · axial · 0.30mm/px · z∈[-222,-78]mm · 8 of 84 slices shown, 10 images]
[im 6/84  brain]
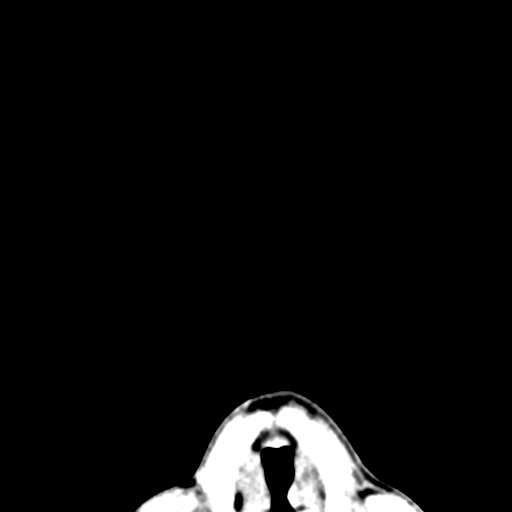
[im 6/84  bone]
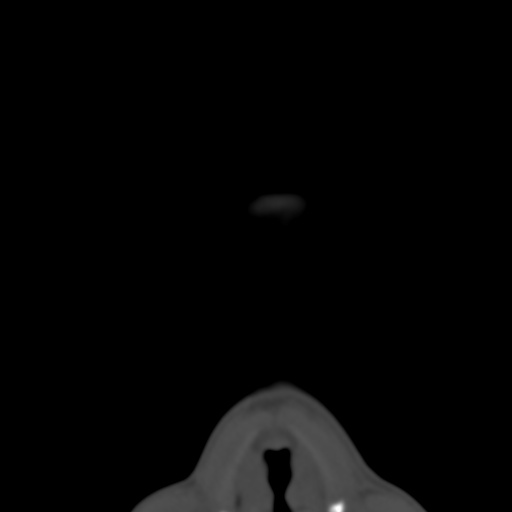
[im 18/84  bone]
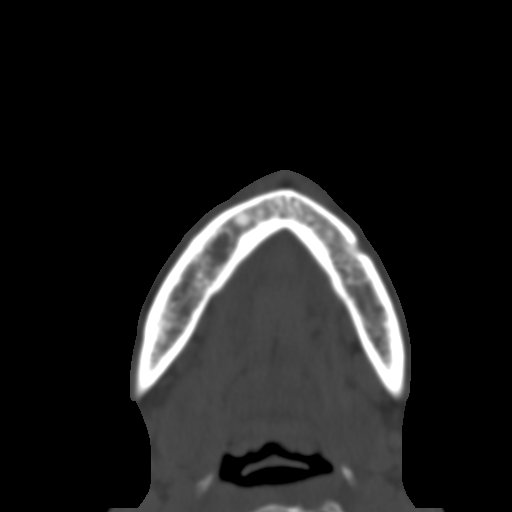
[im 26/84  bone]
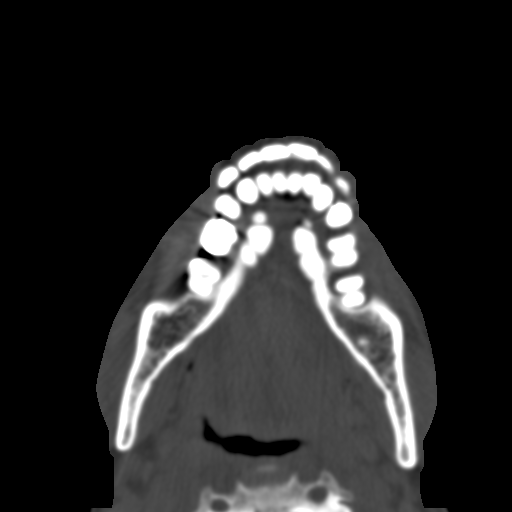
[im 38/84  bone]
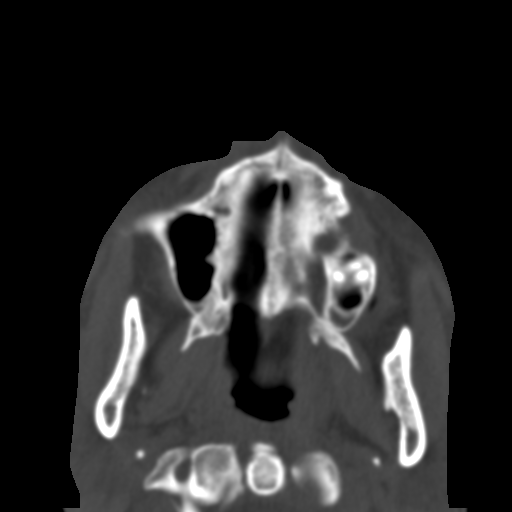
[im 46/84  brain]
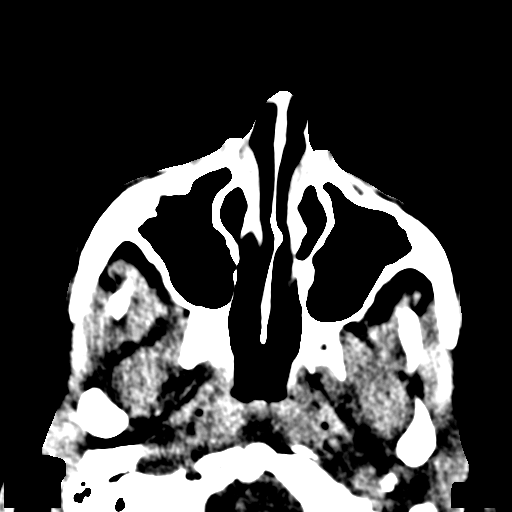
[im 46/84  bone]
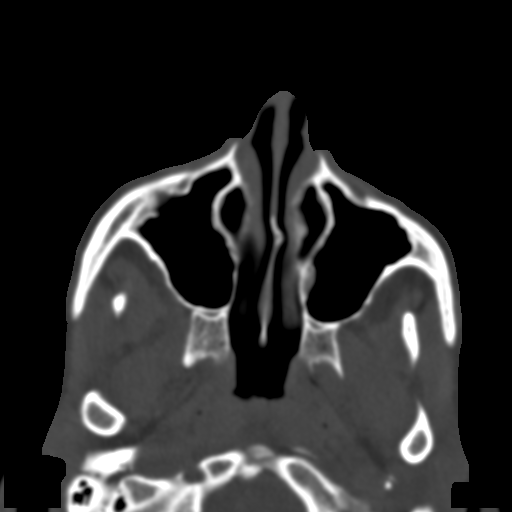
[im 58/84  bone]
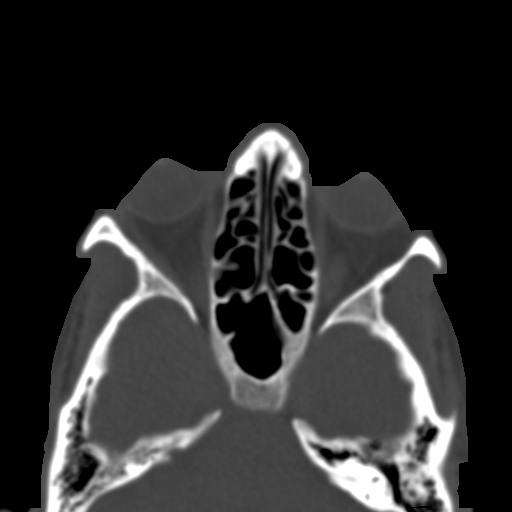
[im 66/84  bone]
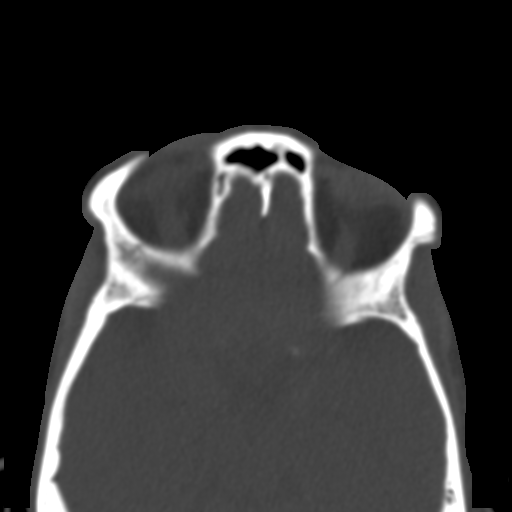
[im 78/84  bone]
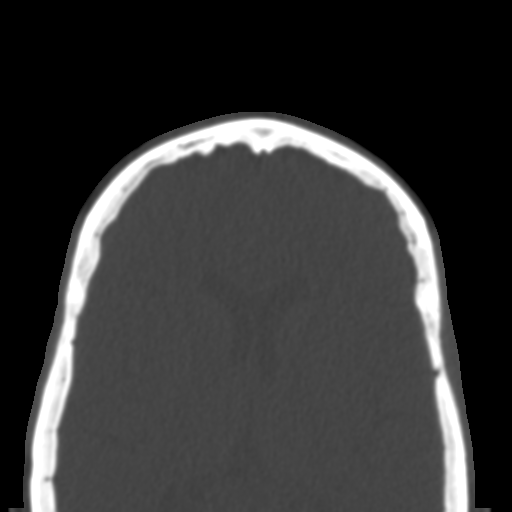

[Series 7: coronal soft · coronal · 0.29mm/px · 3 of 62 slices shown]
[im 21/62  bone]
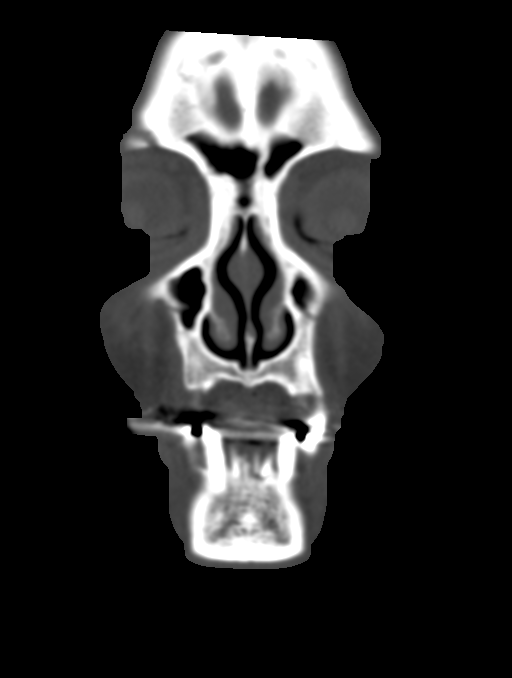
[im 28/62  bone]
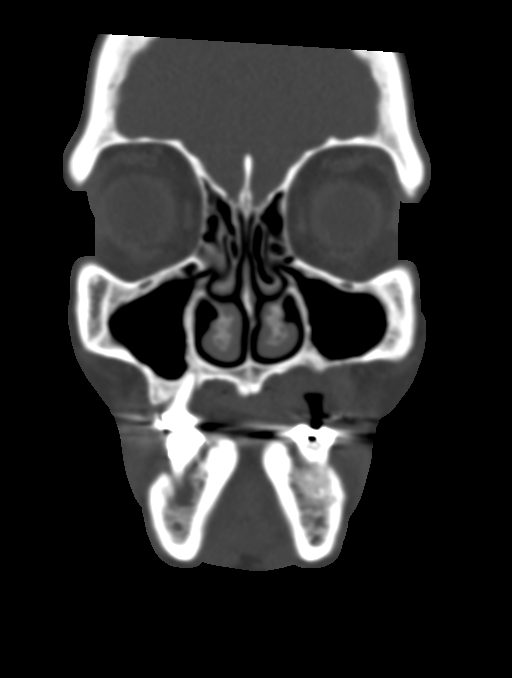
[im 34/62  bone]
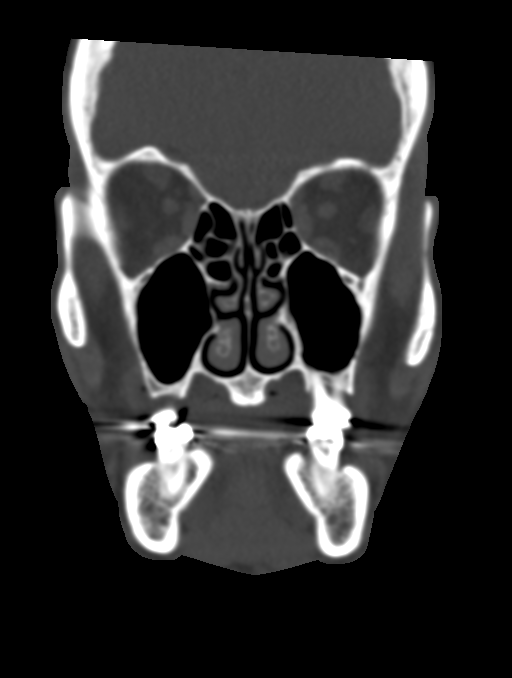

[Series 8: sagittal soft · sagittal · 0.24mm/px · 3 of 75 slices shown]
[im 25/75  bone]
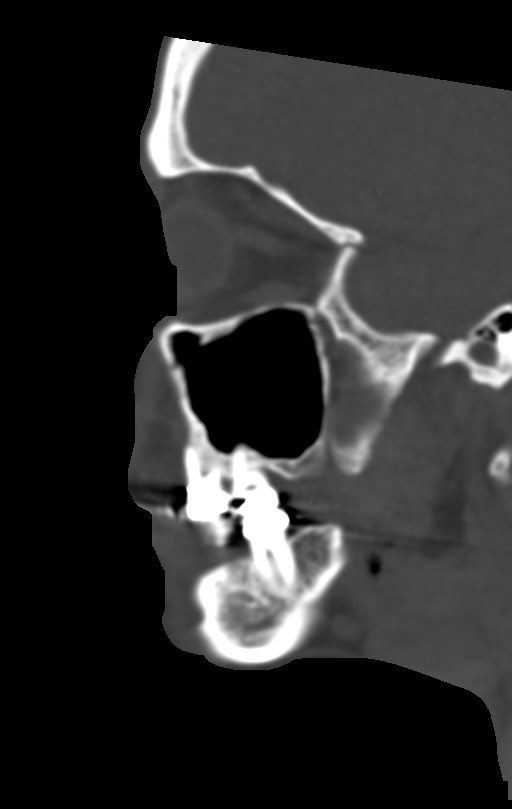
[im 38/75  bone]
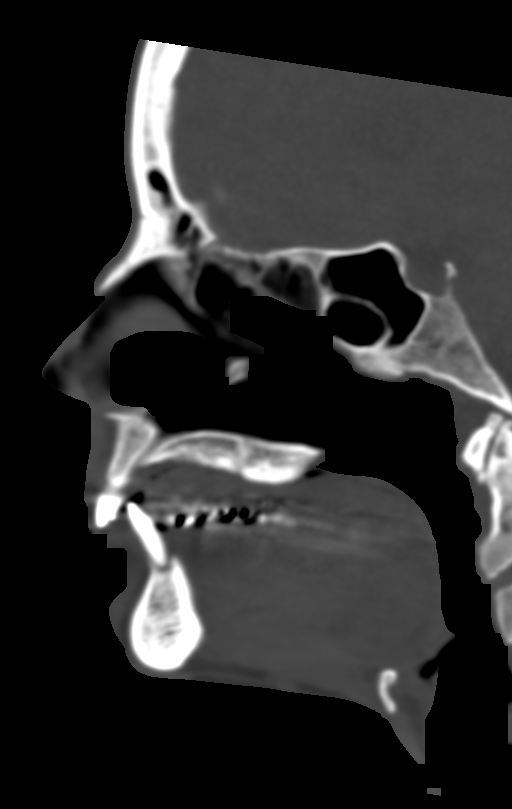
[im 50/75  bone]
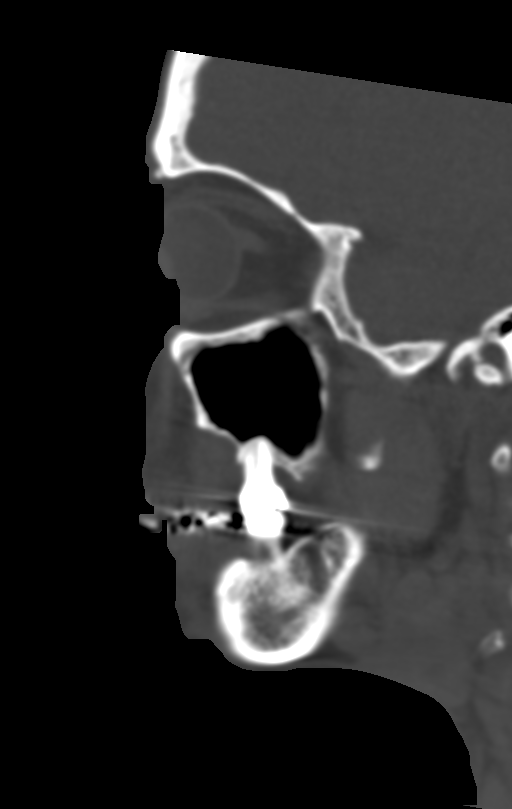

[14 of 47 positions shown; findings below may reference images not displayed]

FINDINGS: CT HEAD FINDINGS

Brain: Ventricles are within normal limits in size and
configuration. There is no mass, hemorrhage, edema or other evidence
of acute parenchymal abnormality. No extra-axial hemorrhage.

Vascular: Chronic calcified atherosclerotic changes of the large
vessels at the skull base. No unexpected hyperdense vessel.

Skull: Normal. Negative for fracture or focal lesion.

Other: None.

CT MAXILLOFACIAL FINDINGS

Osseous: Lower frontal bones are intact. No displaced nasal bone
fracture. Osseous structures about the orbits are intact and
normally aligned bilaterally. Walls of the maxillary sinuses are
intact and normally aligned. Bilateral zygomatic arches and
pterygoid plates are intact. No mandible fracture or displacement is
seen.

Lucency/defect at the base of the next to last RIGHT mandibular
tooth, most likely periapical dental abscess, less likely acute
traumatic dislodgement.

Orbits: Negative. No traumatic or inflammatory finding.

Sinuses: Clear.

Soft tissues: Mild soft tissue edema overlying the upper RIGHT
orbit/lower frontal bone. No underlying fracture.

CT CERVICAL SPINE FINDINGS

Alignment: Within normal limits. No evidence of acute vertebral body
subluxation.

Skull base and vertebrae: No fracture line or displaced fracture
fragment is seen.

Soft tissues and spinal canal: No prevertebral fluid or swelling. No
visible canal hematoma.

Disc levels:  Disc spaces are well maintained throughout.

Upper chest: Negative.

Other: None.
IMPRESSION: 1. No acute intracranial abnormality. No intracranial mass,
hemorrhage or edema. No skull fracture.
2. No facial bone fracture or displacement. Mild soft tissue edema
overlying the upper RIGHT orbit/lower frontal bone.
3. Lucency/defect at the base of the next to last RIGHT mandibular
tooth, most likely periapical dental abscess/Gavidia, less likely
acute traumatic dislodgement.
4. No fracture or acute subluxation within the cervical spine.

## 2022-06-01 NOTE — Group Note (Signed)
[facility] LCSW Group Therapy Note    Group Date: 06/01/2022 Start Time: 1315 End Time: 1415  Type of Therapy and Topic:  Group Therapy:  Overcoming Obstacles  Participation Level:  [facility] PARTICIPATION LEVEL: Did Not Attend   Description of Group:   In this group patients will be encouraged to explore what they see as obstacles to their own wellness and recovery. They will be guided to discuss their thoughts, feelings, and behaviors related to these obstacles. The group will process together ways to cope with barriers, with attention given to specific choices patients can make. Each patient will be challenged to identify changes they are motivated to make in order to overcome their obstacles. This group will be process-oriented, with patients participating in exploration of their own experiences as well as giving and receiving support and challenge from other group members.  Therapeutic Goals: 1. Patient will identify personal and current obstacles as they relate to admission. 2. Patient will identify barriers that currently interfere with their wellness or overcoming obstacles.  3. Patient will identify feelings, thought process and behaviors related to these barriers. 4. Patient will identify two changes they are willing to make to overcome these obstacles:    Summary of Patient Progress X   Therapeutic Modalities:   Cognitive Behavioral Therapy Solution Focused Therapy Motivational Interviewing Relapse Prevention Therapy   Heer Justiss R Janita Camberos, LCSW 

## 2022-06-01 NOTE — [facility] Counselor (Signed)
CSW received call from [place] [phone]). Laural Benes shared that she was calling to let CSW that they do not help with group home placement. She also stated that they are recommending that we initiate medicaid application here. CSW explained that this was not something that we do here. CSW informed Laural Benes that guardian would be contacted to follow up. No other concerns expressed. Contact ended without incident.   Vilma Meckel. Algis Greenhouse, MSW, LCSW, LCAS 06/01/2022 4:25 PM

## 2022-06-01 NOTE — Progress Notes (Signed)
[facility] MD Progress Note  06/01/2022 4:40 PM [name]  MRN:  [id number] Subjective: No change at all in the presentation of this 61 year old woman with schizophrenia.  We have changed her to a new antipsychotic and there is still no difference.  Gets irate and angry when her family is discussed. Principal Problem: Schizoaffective disorder, depressive type (HCC) Diagnosis: Principal Problem:   Schizoaffective disorder, depressive type (HCC)  Total Time spent with patient: 30 minutes  Past Psychiatric History: Past history of schizophrenia  Past Medical History:  Past Medical History:  Diagnosis Date   Anemia 10/02/2021   Non compliance w medication regimen    Schizophrenia [facility])     Past Surgical History:  Procedure Laterality Date   BIOPSY  09/25/2021   Procedure: BIOPSY;  Surgeon: Rachael Fee, MD;  Location: [facility];  Service: Gastroenterology;;   ESOPHAGOGASTRODUODENOSCOPY (EGD) WITH PROPOFOL [name] 09/25/2021   Procedure: ESOPHAGOGASTRODUODENOSCOPY (EGD) WITH PROPOFOL;  Surgeon: Rachael Fee, MD;  Location: [facility];  Service: Gastroenterology;  Laterality: [name];  With NGT placement   Family History: History reviewed. No pertinent family history. Family Psychiatric  History: See previous Social History:  Social History   Substance and Sexual Activity  Alcohol Use Not Currently   Comment: Refuses to disclose how much     Social History   Substance and Sexual Activity  Drug Use No   Comment: Refuses to answer    Social History   Socioeconomic History   Marital status: Single    Spouse name: Not on file   Number of children: Not on file   Years of education: Not on file   Highest education level: Not on file  Occupational History   Not on file  Tobacco Use   Smoking status: Every Day    Packs/day: 2.00    Types: Cigarettes   Smokeless tobacco: Never  Substance and Sexual Activity   Alcohol use: Not Currently    Comment: Refuses to disclose how much    Drug use: No    Comment: Refuses to answer   Sexual activity: Not Currently    Comment: refused to answer  Other Topics Concern   Not on file  Social History Narrative   Not on file   Social Determinants of Health   Financial Resource Strain: Not on file  Food Insecurity: No Food Insecurity (04/08/2022)   Hunger Vital Sign    Worried About Running Out of Food in the Last Year: Never true    Ran Out of Food in the Last Year: Never true  Transportation Needs: No Transportation Needs (04/08/2022)   PRAPARE - Administrator, Civil Service (Medical): No    Lack of Transportation (Non-Medical): No  Physical Activity: Not on file  Stress: Not on file  Social Connections: Not on file   Additional Social History:                         Sleep: Negative  Appetite:  Negative  Current Medications: Current Facility-Administered Medications  Medication Dose Route Frequency Provider Last Rate Last Admin   acetaminophen (TYLENOL) tablet 650 mg  650 mg Oral Q6H PRN Gladyce Mcray T, MD       alum & mag hydroxide-simeth (MAALOX/MYLANTA) 200-200-20 MG/5ML suspension 30 mL  30 mL Oral Q4H PRN Kruz Chiu T, MD       haloperidol (HALDOL) tablet 5 mg  5 mg Oral BID Josafat Enrico, [name], MD  5 mg at 06/01/22 6948   Or   haloperidol lactate (HALDOL) injection 5 mg  5 mg Intramuscular BID Adyson Vanburen T, MD       lithium carbonate capsule 600 mg  600 mg Oral QHS Aime Carreras T, MD   600 mg at 05/31/22 2102   LORazepam (ATIVAN) tablet 1 mg  1 mg Oral BID Yatziry Deakins T, MD   1 mg at 06/01/22 5462   Or   LORazepam (ATIVAN) injection 1 mg  1 mg Intramuscular BID Beckam Abdulaziz T, MD       magnesium hydroxide (MILK OF MAGNESIA) suspension 30 mL  30 mL Oral Daily PRN Oddis Westling T, MD       mirtazapine (REMERON) tablet 30 mg  30 mg Oral QHS Kleigh Hoelzer T, MD   30 mg at 05/31/22 2103   multivitamin with minerals tablet 1 tablet  1 tablet Oral Daily Saaya Procell, [name], MD   1  tablet at 06/01/22 0810   QUEtiapine (SEROQUEL) tablet 400 mg  400 mg Oral QHS Jovaun Levene T, MD   400 mg at 05/31/22 2102   thiamine (VITAMIN B1) tablet 100 mg  100 mg Oral Daily Audry Kauzlarich, [name], MD   100 mg at 06/01/22 7035    Lab Results: No results found for this or any previous visit (from the past 48 hour(s)).  Blood Alcohol level:  Lab Results  Component Value Date   ETH <10 03/29/2022   ETH <10 09/12/2021    Metabolic Disorder Labs: Lab Results  Component Value Date   HGBA1C 5.6 10/21/2021   MPG 114 10/21/2021   MPG 111 10/02/2021   Lab Results  Component Value Date   PROLACTIN 13.6 04/16/2015   Lab Results  Component Value Date   CHOL 189 10/21/2021   TRIG 52 10/21/2021   HDL 50 10/21/2021   CHOLHDL 3.8 10/21/2021   VLDL 10 10/21/2021   LDLCALC 129 (H) 10/21/2021   LDLCALC 93 10/01/2020    Physical Findings: AIMS: Facial and Oral Movements Muscles of Facial Expression: None, normal Lips and Perioral Area: None, normal Jaw: None, normal Tongue: None, normal,Extremity Movements Upper (arms, wrists, hands, fingers): None, normal Lower (legs, knees, ankles, toes): None, normal, Trunk Movements Neck, shoulders, hips: None, normal, Overall Severity Severity of abnormal movements (highest score from questions above): None, normal Incapacitation due to abnormal movements: None, normal Patient's awareness of abnormal movements (rate only patient's report): No Awareness, Dental Status Current problems with teeth and/or dentures?: No Does patient usually wear dentures?: No  CIWA:    COWS:     Musculoskeletal: Strength & Muscle Tone: within normal limits Gait & Station: normal Patient leans: [name]  Psychiatric Specialty Exam:  Presentation  General Appearance:  Disheveled  Eye Contact: Fair  Speech: Pressured  Speech Volume: Increased  Handedness: Right   Mood and Affect  Mood: Irritable; Labile; Angry  Affect: Blunt; Labile   Thought  Process  Thought Processes: Disorganized; Irrevelant  Descriptions of Associations:Loose  Orientation:Partial  Thought Content:Illogical; Rumination; Scattered; Tangential; Delusions  History of Schizophrenia/Schizoaffective disorder:Yes  Duration of Psychotic Symptoms:Greater than six months  Hallucinations:No data recorded Ideas of Reference:Percusatory; Paranoia  Suicidal Thoughts:No data recorded Homicidal Thoughts:No data recorded  Sensorium  Memory: Immediate Fair; Recent Fair; Remote Fair  Judgment: Impaired  Insight: Poor; Lacking   Executive Functions  Concentration: Poor  Attention Span: Poor  Recall: Poor  Fund of Knowledge: Poor  Language: Poor   Psychomotor Activity  Psychomotor Activity:No  data recorded  Assets  Assets: Physical Health; Resilience; Housing; Social Support   Sleep  Sleep:No data recorded   Physical Exam: Physical Exam Vitals and nursing note reviewed.  Constitutional:      Appearance: Normal appearance.  HENT:     Head: Normocephalic and atraumatic.     Mouth/Throat:     Pharynx: Oropharynx is clear.  Eyes:     Pupils: Pupils are equal, round, and reactive to light.  Cardiovascular:     Rate and Rhythm: Normal rate and regular rhythm.  Pulmonary:     Effort: Pulmonary effort is normal.     Breath sounds: Normal breath sounds.  Abdominal:     General: Abdomen is flat.     Palpations: Abdomen is soft.  Musculoskeletal:        General: Normal range of motion.  Skin:    General: Skin is warm and dry.  Neurological:     General: No focal deficit present.     Mental Status: She is alert. Mental status is at baseline.  Psychiatric:        Attention and Perception: She is inattentive.        Mood and Affect: Mood normal. Affect is blunt.        Speech: Speech is delayed.        Behavior: Behavior is agitated.        Thought Content: Thought content is paranoid and delusional.    Review of Systems   Constitutional: Negative.   HENT: Negative.    Eyes: Negative.   Respiratory: Negative.    Cardiovascular: Negative.   Gastrointestinal: Negative.   Musculoskeletal: Negative.   Skin: Negative.   Neurological: Negative.   Psychiatric/Behavioral: Negative.     Blood pressure 107/69, pulse (!) 59, temperature 97.8 F (36.6 C), temperature source Oral, resp. rate 18, height 5\' 4"  (1.626 m), weight 40.4 kg, SpO2 100 %. Body mass index is 15.28 kg/m.   Treatment Plan Summary: Plan genuinely not really clear what to do for this woman.  New antipsychotic in place she remains psychotic and paranoid.  Not clear if she will ever improve.  Not a good closet being candidate at all because of her lack of insight.  We will readdress with family if they could tolerate having her come home.  , MD 06/01/2022, 4:40 PM

## 2022-06-01 NOTE — Plan of Care (Signed)
Patient verbalized no issues. Appropriate with staff & peers. Denies SI,HI and AVH. Visible in the milieu at times. Appetite and energy level good. Support and encouragement given.

## 2022-06-01 NOTE — Progress Notes (Signed)
Recreation Therapy Notes   Date: 06/01/2022  Time: 10:50 am     Location: Craft room   Behavioral response: [name]   Intervention Topic: Stress Management   Discussion/Intervention: Patient refused to attend group.   Clinical Observations/Feedback:  Patient refused to attend group.    Livie Vanderhoof LRT/CTRS        Leetta Hendriks 06/01/2022 12:56 PM

## 2022-06-02 DIAGNOSIS — F251 Schizoaffective disorder, depressive type: Secondary | ICD-10-CM | POA: Diagnosis not present

## 2022-06-02 NOTE — Plan of Care (Signed)

## 2022-06-02 NOTE — Progress Notes (Signed)
[name] is isolative to her room. She is pleasant and cooperative upon approach. She is med compliant and takes meds without issue.  Denies si hi avh. Q15 safety checks in place.  Encouraged patient to come to staff with any concerns.     C Butler-Nicholson, LPN

## 2022-06-02 NOTE — Group Note (Signed)
[facility] LCSW Group Therapy Note   Group Date: 06/02/2022 Start Time: 1300 End Time: 1400  Type of Therapy/Topic:  Group Therapy:  Feelings about Diagnosis  Participation Level:  Did Not Attend   Description of Group:    This group will allow patients to explore their thoughts and feelings about diagnoses they have received. Patients will be guided to explore their level of understanding and acceptance of these diagnoses. Facilitator will encourage patients to process their thoughts and feelings about the reactions of others to their diagnosis, and will guide patients in identifying ways to discuss their diagnosis with significant others in their lives. This group will be process-oriented, with patients participating in exploration of their own experiences as well as giving and receiving support and challenge from other group members.   Therapeutic Goals: 1. Patient will demonstrate understanding of diagnosis as evidence by identifying two or more symptoms of the disorder:  2. Patient will be able to express two feelings regarding the diagnosis 3. Patient will demonstrate ability to communicate their needs through discussion and/or role plays  Summary of Patient Progress: Patient declined to attend group, despite encouragement.    Therapeutic Modalities:   Cognitive Behavioral Therapy Brief Therapy Feelings Identification    Harden Mo, LCSW

## 2022-06-02 NOTE — [facility] Counselor (Signed)
CSW contacted the legal guardian, [name], [phone] .    CSW updated that at this time the patient is approaching discharge and attempted to ascertain guardian's willingness to have patient return.    Guardian reports that she "can't handle her".  She reports that her mother "is sick and having her home will worsen my mom".  She reports concerns that patient needs Medicaid.  Guardian reports frustration that this has not been completed.  CSW reports that she can follow up with the financial counselor.    Guardian reports frustration that patient has not been placed.  CSW explained that recommendations are for patient to return home.  CSW explained that guardian is requesting a long-term facility, the only long term facility is [facility] and patient is not appropriate.  CSW pointed out that guardian has declined group homes multiple times.  Guardian reports that she has "no idea' where the patient can go at discharge.  She states repeatedly "we will have to figure it out, it is what it is".  She reports that patient will not be transported by her, however, does not state that patient can not become home, but that she is afraid of her reaction and behaviors towards their mother.  She requests information on a group home at this time.  CSW notes that this is different that guardian's previous statements that she does not want to pursue a group home.  Guardian reports that she was not willing to pursue a group home due to patient declining to eat at times and not taking medications and belief that group home can not assist with this.  CSW discussed the possibility of going to [facility],  guardian provided permission for CSW to pursue this.   CSW called [facility] and was informed they do not have bed availability at this time.  Penni Homans, MSW, LCSW 06/02/2022 4:17 PM

## 2022-06-02 NOTE — Plan of Care (Signed)
  Problem: Education: Goal: Knowledge of General Education information will improve Description: Including pain rating scale, medication(s)/side effects and non-pharmacologic comfort measures Outcome: Progressing   Problem: Health Behavior/Discharge Planning: Goal: Ability to manage health-related needs will improve Outcome: Progressing   Problem: Clinical Measurements: Goal: Ability to maintain clinical measurements within normal limits will improve Outcome: Progressing Goal: Will remain free from infection Outcome: Progressing Goal: Diagnostic test results will improve Outcome: Progressing Goal: Respiratory complications will improve Outcome: Progressing Goal: Cardiovascular complication will be avoided Outcome: Progressing   Problem: Safety: Goal: Periods of time without injury will increase Outcome: Progressing   Problem: Physical Regulation: Goal: Ability to maintain clinical measurements within normal limits will improve Outcome: Progressing   Problem: Health Behavior/Discharge Planning: Goal: Identification of resources available to assist in meeting health care needs will improve Outcome: Progressing Goal: Compliance with treatment plan for underlying cause of condition will improve Outcome: Progressing   Problem: Coping: Goal: Ability to verbalize frustrations and anger appropriately will improve Outcome: Progressing Goal: Ability to demonstrate self-control will improve Outcome: Progressing   

## 2022-06-02 NOTE — Progress Notes (Signed)
[facility] MD Progress Note  06/02/2022 11:04 AM [name]  MRN:  [id number] Subjective: Patient seen again for follow-up.  61 year old woman with schizophrenia or schizoaffective disorder.  Patient continues to spend much of her time in a darkened room standing in the corner doing nothing.  She does eat her meals however and comes out of the room occasionally to watch television.  On interaction she has no new complaints only requesting discharge.  Continues to get agitated whenever the subject of her family or the reality of her living situation is addressed.  Refers to her other family members by inappropriate terms such as continuing to insist her sister is really her grandmother. Principal Problem: Schizoaffective disorder, depressive type (HCC) Diagnosis: Principal Problem:   Schizoaffective disorder, depressive type (HCC)  Total Time spent with patient: 30 minutes  Past Psychiatric History: Long history of chronic psychotic disorder.  Mostly managed at home.  Past Medical History:  Past Medical History:  Diagnosis Date   Anemia 10/02/2021   Non compliance w medication regimen    Schizophrenia [facility])     Past Surgical History:  Procedure Laterality Date   BIOPSY  09/25/2021   Procedure: BIOPSY;  Surgeon: Rachael Fee, MD;  Location: [facility];  Service: Gastroenterology;;   ESOPHAGOGASTRODUODENOSCOPY (EGD) WITH PROPOFOL [name] 09/25/2021   Procedure: ESOPHAGOGASTRODUODENOSCOPY (EGD) WITH PROPOFOL;  Surgeon: Rachael Fee, MD;  Location: [facility];  Service: Gastroenterology;  Laterality: [name];  With NGT placement   Family History: History reviewed. No pertinent family history. Family Psychiatric  History: See previous Social History:  Social History   Substance and Sexual Activity  Alcohol Use Not Currently   Comment: Refuses to disclose how much     Social History   Substance and Sexual Activity  Drug Use No   Comment: Refuses to answer    Social History    Socioeconomic History   Marital status: Single    Spouse name: Not on file   Number of children: Not on file   Years of education: Not on file   Highest education level: Not on file  Occupational History   Not on file  Tobacco Use   Smoking status: Every Day    Packs/day: 2.00    Types: Cigarettes   Smokeless tobacco: Never  Substance and Sexual Activity   Alcohol use: Not Currently    Comment: Refuses to disclose how much   Drug use: No    Comment: Refuses to answer   Sexual activity: Not Currently    Comment: refused to answer  Other Topics Concern   Not on file  Social History Narrative   Not on file   Social Determinants of Health   Financial Resource Strain: Not on file  Food Insecurity: No Food Insecurity (04/08/2022)   Hunger Vital Sign    Worried About Running Out of Food in the Last Year: Never true    Ran Out of Food in the Last Year: Never true  Transportation Needs: No Transportation Needs (04/08/2022)   PRAPARE - Administrator, Civil Service (Medical): No    Lack of Transportation (Non-Medical): No  Physical Activity: Not on file  Stress: Not on file  Social Connections: Not on file   Additional Social History:                         Sleep: Fair  Appetite:  Fair  Current Medications: Current Facility-Administered Medications  Medication Dose Route Frequency  Provider Last Rate Last Admin   acetaminophen (TYLENOL) tablet 650 mg  650 mg Oral Q6H PRN Petros Ahart T, MD       alum & mag hydroxide-simeth (MAALOX/MYLANTA) 200-200-20 MG/5ML suspension 30 mL  30 mL Oral Q4H PRN Lujean Ebright T, MD       haloperidol (HALDOL) tablet 5 mg  5 mg Oral BID Alera Quevedo T, MD   5 mg at 06/02/22 0818   Or   haloperidol lactate (HALDOL) injection 5 mg  5 mg Intramuscular BID Jewelz Ricklefs T, MD       lithium carbonate capsule 600 mg  600 mg Oral QHS Jenney Brester T, MD   600 mg at 06/01/22 2050   LORazepam (ATIVAN) tablet 1 mg  1 mg Oral  BID Martell Mcfadyen T, MD   1 mg at 06/02/22 0818   Or   LORazepam (ATIVAN) injection 1 mg  1 mg Intramuscular BID Samirah Scarpati T, MD       magnesium hydroxide (MILK OF MAGNESIA) suspension 30 mL  30 mL Oral Daily PRN Kymani Shimabukuro T, MD       mirtazapine (REMERON) tablet 30 mg  30 mg Oral QHS Crissy Mccreadie T, MD   30 mg at 06/01/22 2049   multivitamin with minerals tablet 1 tablet  1 tablet Oral Daily Avamae Dehaan, [name], MD   1 tablet at 06/02/22 0818   QUEtiapine (SEROQUEL) tablet 400 mg  400 mg Oral QHS Hewitt Garner T, MD   400 mg at 06/01/22 2050   thiamine (VITAMIN B1) tablet 100 mg  100 mg Oral Daily Imojean Yoshino, [name], MD   100 mg at 06/02/22 0818    Lab Results: No results found for this or any previous visit (from the past 48 hour(s)).  Blood Alcohol level:  Lab Results  Component Value Date   ETH <10 03/29/2022   ETH <10 09/12/2021    Metabolic Disorder Labs: Lab Results  Component Value Date   HGBA1C 5.6 10/21/2021   MPG 114 10/21/2021   MPG 111 10/02/2021   Lab Results  Component Value Date   PROLACTIN 13.6 04/16/2015   Lab Results  Component Value Date   CHOL 189 10/21/2021   TRIG 52 10/21/2021   HDL 50 10/21/2021   CHOLHDL 3.8 10/21/2021   VLDL 10 10/21/2021   LDLCALC 129 (H) 10/21/2021   LDLCALC 93 10/01/2020    Physical Findings: AIMS: Facial and Oral Movements Muscles of Facial Expression: None, normal Lips and Perioral Area: None, normal Jaw: None, normal Tongue: None, normal,Extremity Movements Upper (arms, wrists, hands, fingers): None, normal Lower (legs, knees, ankles, toes): None, normal, Trunk Movements Neck, shoulders, hips: None, normal, Overall Severity Severity of abnormal movements (highest score from questions above): None, normal Incapacitation due to abnormal movements: None, normal Patient's awareness of abnormal movements (rate only patient's report): No Awareness, Dental Status Current problems with teeth and/or dentures?: No Does  patient usually wear dentures?: No  CIWA:    COWS:     Musculoskeletal: Strength & Muscle Tone: within normal limits Gait & Station: normal Patient leans: [name]  Psychiatric Specialty Exam:  Presentation  General Appearance:  Disheveled  Eye Contact: Fair  Speech: Pressured  Speech Volume: Increased  Handedness: Right   Mood and Affect  Mood: Irritable; Labile; Angry  Affect: Blunt; Labile   Thought Process  Thought Processes: Disorganized; Irrevelant  Descriptions of Associations:Loose  Orientation:Partial  Thought Content:Illogical; Rumination; Scattered; Tangential; Delusions  History of Schizophrenia/Schizoaffective disorder:Yes  Duration of Psychotic Symptoms:Greater than six months  Hallucinations:No data recorded Ideas of Reference:Percusatory; Paranoia  Suicidal Thoughts:No data recorded Homicidal Thoughts:No data recorded  Sensorium  Memory: Immediate Fair; Recent Fair; Remote Fair  Judgment: Impaired  Insight: Poor; Lacking   Executive Functions  Concentration: Poor  Attention Span: Poor  Recall: Poor  Fund of Knowledge: Poor  Language: Poor   Psychomotor Activity  Psychomotor Activity:No data recorded  Assets  Assets: Physical Health; Resilience; Housing; Social Support   Sleep  Sleep:No data recorded   Physical Exam: Physical Exam Vitals and nursing note reviewed.  Constitutional:      Appearance: Normal appearance.  HENT:     Head: Normocephalic and atraumatic.     Mouth/Throat:     Pharynx: Oropharynx is clear.  Eyes:     Pupils: Pupils are equal, round, and reactive to light.  Cardiovascular:     Rate and Rhythm: Normal rate and regular rhythm.  Pulmonary:     Effort: Pulmonary effort is normal.     Breath sounds: Normal breath sounds.  Abdominal:     General: Abdomen is flat.     Palpations: Abdomen is soft.  Musculoskeletal:        General: Normal range of motion.  Skin:    General:  Skin is warm and dry.  Neurological:     General: No focal deficit present.     Mental Status: She is alert. Mental status is at baseline.  Psychiatric:        Attention and Perception: She is inattentive.        Mood and Affect: Mood normal. Affect is inappropriate.        Speech: She is noncommunicative.        Behavior: Behavior is uncooperative.        Thought Content: Thought content normal.        Cognition and Memory: Memory is impaired.        Judgment: Judgment is inappropriate.    Review of Systems  Constitutional: Negative.   HENT: Negative.    Eyes: Negative.   Respiratory: Negative.    Cardiovascular: Negative.   Gastrointestinal: Negative.   Musculoskeletal: Negative.   Skin: Negative.   Neurological: Negative.   Psychiatric/Behavioral:  Negative for depression, hallucinations, memory loss, substance abuse and suicidal ideas. The patient is not nervous/anxious and does not have insomnia.    Blood pressure 111/67, pulse 64, temperature 97.9 F (36.6 C), temperature source Oral, resp. rate 18, height 5\' 4"  (1.626 m), weight 40.4 kg, SpO2 100 %. Body mass index is 15.28 kg/m.   Treatment Plan Summary: Medication management and Plan patient is now on an appropriate dose of Seroquel and yet showing no change in the psychotic symptoms.  Still becomes agitated with delusional and paranoid speech when we tried to discuss the details of going home.  I brought up with her the proposition that we try finding her a group home or similar facility and she immediately became irate insisting she would not do that.  She is not threatening or violent to anyone.  I have asked social work if we could try contacting the family again to see if we could get her discharged home.  , MD 06/02/2022, 11:04 AM

## 2022-06-02 NOTE — Progress Notes (Signed)
Recreation Therapy Notes   Date: 06/02/2022  Time: 10:15 am     Location: Craft room   Behavioral response: [name]   Intervention Topic: Coping Skills    Discussion/Intervention: Patient refused to attend group.   Clinical Observations/Feedback:  Patient refused to attend group.    Valisha Heslin LRT/CTRS        [name] 06/02/2022 12:15 PM

## 2022-06-02 NOTE — Progress Notes (Signed)
Uneventful shift. Patient continues to isolate to self and room. No complaints or concerns voiced. Out of room for snacks and meds. No interaction with peers, minimal with staff. Denies Si,HI, AVH. Medication compliant. Encouragement and support provided. Safety checks maintained. Medications given as prescribed. Pt receptive and remains safe on unit with q 15 min checks.

## 2022-06-02 NOTE — Plan of Care (Signed)
Patient rated her depression and anxiety 0/10. Patient stated that her goal for today is getting discharge. Patient denies SI,HI and AVH. Patient stays in her room in darkness most of the shift. Visible in the milieu at meal times. Minimal interactions with staff & peers. Appetite and energy level good. Support and encouragement given.

## 2022-06-03 ENCOUNTER — Telehealth: Payer: Self-pay | Admitting: *Deleted

## 2022-06-03 DIAGNOSIS — F251 Schizoaffective disorder, depressive type: Secondary | ICD-10-CM | POA: Diagnosis not present

## 2022-06-03 NOTE — Progress Notes (Signed)
Recreation Therapy Notes  Date: 06/03/2022  Time: 10:45 am     Location: Craft room   Behavioral response: [name]   Intervention Topic: Self-esteem     Discussion/Intervention: Patient refused to attend group.   Clinical Observations/Feedback:  Patient refused to attend group.    Chalmer Zheng LRT/CTRS        Anelisse Jacobson 06/03/2022 1:01 PM

## 2022-06-03 NOTE — B[facility] Counselor (Addendum)
Addendum CSW has since been informed that patient makes over the cap for Medicaid by $243.  CSW called to inform guardian.  Guardian expressed confusion stating that she was told by "them" that patient qualifies for long term care and for Medicaid.  Guardian identified "them" as being former providers of the patient that she has reached out to.  Guardian self reports feelings of being overwhelmed.  CSW provided empathy and active listening.  CSW requested that Financial Counseling follow up with the guardian to clarify any remaining questions.  Penni Homans, MSW, LCSW 06/03/2022 4:34 PM     CSW has reached out to Financial Counseling who report that they will follow up with guardian.  Penni Homans, MSW, LCSW 06/03/2022 12:17 PM

## 2022-06-03 NOTE — Patient Outreach (Signed)
Care Coordination   06/03/2022 Name: [name] MRN: [id number] DOB: Sep 08, 1960   Care Coordination Outreach Attempts:  An unsuccessful telephone outreach was attempted today to offer the patient information about available care coordination services as a benefit of their health plan.  Call to pt's sister, [name], for update of her sister's discharge plan. Chart review revealed plan of care is to d/c to a group home.  Follow Up Plan:  Additional outreach attempts will be made to offer the patient care coordination information and services.  NP will call again if no response from [name].  Encounter Outcome:  No Answer  Care Coordination Interventions Activated:  No   Care Coordination Interventions:  No, not indicated    SIG Newel Oien C. Burgess Estelle, MSN, [facility] Gerontological Nurse Practitioner [facility] Care Management [phone]

## 2022-06-03 NOTE — Progress Notes (Signed)
Pt denies SI/HI/AVH and verbally agrees to approach staff if these become apparent or before harming themselves/others. Rates depression 0/10. Rates anxiety 0/10. Rates pain 0/10. No changes. Scheduled medications administered to pt, per MD orders. RN provided support and encouragement to pt. Q15 min safety checks implemented and continued. Pt safe on the unit. RN will continue to monitor and intervene as needed. Problem: Coping: Goal: Level of anxiety will decrease Outcome: Progressing   Problem: Elimination: Goal: Will not experience complications related to bowel motility Outcome: Progressing    06/03/22 0811  Psych Admission Type (Psych Patients Only)  Admission Status Involuntary  Psychosocial Assessment  Patient Complaints None  Eye Contact Fair  Facial Expression Flat  Affect Appropriate to circumstance  Speech Logical/coherent  Interaction Minimal  Motor Activity Slow  Appearance/Hygiene Unremarkable;In scrubs  Behavior Characteristics Cooperative;Appropriate to situation;Calm  Mood Depressed;Pleasant  Thought Process  Coherency WDL  Content WDL  Delusions None reported or observed  Perception WDL  Hallucination None reported or observed  Judgment WDL  Confusion None  Danger to Self  Current suicidal ideation? Denies  Danger to Others  Danger to Others None reported or observed

## 2022-06-03 NOTE — B[facility] Counselor (Signed)
CSW called sister to update that [facility] does not have bed availability.  CSW updated that Eligha Bridegroom was suggested by [facility], though CSW has not had an opportunity to follow up on this.  Guardian reports that she will.   CSW informed that she will be sending a email to guardian with group home list.   CSW reports that she will follow up on call from Financial Counseling on the [facility] application.  Guardian reports that patient CAN NOT return to her home.   Penni Homans, MSW, LCSW 06/03/2022 12:09 PM

## 2022-06-03 NOTE — [facility] Group Notes (Signed)
[facility] Group Notes:  (Nursing/MHT/Case Management/Adjunct)  Date:  06/03/2022  Time:  8:51 PM  Type of Therapy:   Wrap up  Participation Level:  Did Not Attend     Summary of Progress/Problems:  [name] 06/03/2022, 8:51 PM

## 2022-06-03 NOTE — Progress Notes (Signed)
[facility] MD Progress Note  06/03/2022 3:29 PM [name]  MRN:  [id number] Subjective: Follow-up patient with schizoaffective disorder.  Patient has no specific new complaint but continues to be paranoid and delusional and get very agitated when we tried to discuss discharge planning.  We tried to propose to family that they take her home as this appears to be her baseline.  They are currently refusing. Principal Problem: Schizoaffective disorder, depressive type (HCC) Diagnosis: Principal Problem:   Schizoaffective disorder, depressive type (HCC)  Total Time spent with patient: 30 minutes  Past Psychiatric History: Past history of longstanding schizophrenia  Past Medical History:  Past Medical History:  Diagnosis Date   Anemia 10/02/2021   Non compliance w medication regimen    Schizophrenia [facility])     Past Surgical History:  Procedure Laterality Date   BIOPSY  09/25/2021   Procedure: BIOPSY;  Surgeon: Rachael Fee, MD;  Location: [facility];  Service: Gastroenterology;;   ESOPHAGOGASTRODUODENOSCOPY (EGD) WITH PROPOFOL [name] 09/25/2021   Procedure: ESOPHAGOGASTRODUODENOSCOPY (EGD) WITH PROPOFOL;  Surgeon: Rachael Fee, MD;  Location: [facility];  Service: Gastroenterology;  Laterality: [name];  With NGT placement   Family History: History reviewed. No pertinent family history. Family Psychiatric  History: See previous Social History:  Social History   Substance and Sexual Activity  Alcohol Use Not Currently   Comment: Refuses to disclose how much     Social History   Substance and Sexual Activity  Drug Use No   Comment: Refuses to answer    Social History   Socioeconomic History   Marital status: Single    Spouse name: Not on file   Number of children: Not on file   Years of education: Not on file   Highest education level: Not on file  Occupational History   Not on file  Tobacco Use   Smoking status: Every Day    Packs/day: 2.00    Types: Cigarettes   Smokeless  tobacco: Never  Substance and Sexual Activity   Alcohol use: Not Currently    Comment: Refuses to disclose how much   Drug use: No    Comment: Refuses to answer   Sexual activity: Not Currently    Comment: refused to answer  Other Topics Concern   Not on file  Social History Narrative   Not on file   Social Determinants of Health   Financial Resource Strain: Not on file  Food Insecurity: No Food Insecurity (04/08/2022)   Hunger Vital Sign    Worried About Running Out of Food in the Last Year: Never true    Ran Out of Food in the Last Year: Never true  Transportation Needs: No Transportation Needs (04/08/2022)   PRAPARE - Administrator, Civil Service (Medical): No    Lack of Transportation (Non-Medical): No  Physical Activity: Not on file  Stress: Not on file  Social Connections: Not on file   Additional Social History:                         Sleep: Fair  Appetite:  Fair  Current Medications: Current Facility-Administered Medications  Medication Dose Route Frequency Provider Last Rate Last Admin   acetaminophen (TYLENOL) tablet 650 mg  650 mg Oral Q6H PRN Judeen Geralds T, MD       alum & mag hydroxide-simeth (MAALOX/MYLANTA) 200-200-20 MG/5ML suspension 30 mL  30 mL Oral Q4H PRN Van Seymore, [name], MD  haloperidol (HALDOL) tablet 5 mg  5 mg Oral BID Aniayah Alaniz, [name], MD   5 mg at 06/03/22 5284   Or   haloperidol lactate (HALDOL) injection 5 mg  5 mg Intramuscular BID Alvia Tory T, MD       lithium carbonate capsule 600 mg  600 mg Oral QHS Rexann Lueras T, MD   600 mg at 06/02/22 2122   LORazepam (ATIVAN) tablet 1 mg  1 mg Oral BID Julitza Rickles T, MD   1 mg at 06/03/22 1324   Or   LORazepam (ATIVAN) injection 1 mg  1 mg Intramuscular BID Angelina Neece T, MD       magnesium hydroxide (MILK OF MAGNESIA) suspension 30 mL  30 mL Oral Daily PRN Nahum Sherrer T, MD       mirtazapine (REMERON) tablet 30 mg  30 mg Oral QHS Rodriquez Thorner T, MD   30 mg at  06/02/22 2122   multivitamin with minerals tablet 1 tablet  1 tablet Oral Daily [place] Mckeough, [name], MD   1 tablet at 06/03/22 4010   QUEtiapine (SEROQUEL) tablet 400 mg  400 mg Oral QHS Kylii Ennis T, MD   400 mg at 06/02/22 2122   thiamine (VITAMIN B1) tablet 100 mg  100 mg Oral Daily Markiya Keefe, [name], MD   100 mg at 06/03/22 2725    Lab Results: No results found for this or any previous visit (from the past 48 hour(s)).  Blood Alcohol level:  Lab Results  Component Value Date   ETH <10 03/29/2022   ETH <10 09/12/2021    Metabolic Disorder Labs: Lab Results  Component Value Date   HGBA1C 5.6 10/21/2021   MPG 114 10/21/2021   MPG 111 10/02/2021   Lab Results  Component Value Date   PROLACTIN 13.6 04/16/2015   Lab Results  Component Value Date   CHOL 189 10/21/2021   TRIG 52 10/21/2021   HDL 50 10/21/2021   CHOLHDL 3.8 10/21/2021   VLDL 10 10/21/2021   LDLCALC 129 (H) 10/21/2021   LDLCALC 93 10/01/2020    Physical Findings: AIMS: Facial and Oral Movements Muscles of Facial Expression: None, normal Lips and Perioral Area: None, normal Jaw: None, normal Tongue: None, normal,Extremity Movements Upper (arms, wrists, hands, fingers): None, normal Lower (legs, knees, ankles, toes): None, normal, Trunk Movements Neck, shoulders, hips: None, normal, Overall Severity Severity of abnormal movements (highest score from questions above): None, normal Incapacitation due to abnormal movements: None, normal Patient's awareness of abnormal movements (rate only patient's report): No Awareness, Dental Status Current problems with teeth and/or dentures?: No Does patient usually wear dentures?: No  CIWA:    COWS:     Musculoskeletal: Strength & Muscle Tone: within normal limits Gait & Station: normal Patient leans: [name]  Psychiatric Specialty Exam:  Presentation  General Appearance:  Disheveled  Eye Contact: Fair  Speech: Pressured  Speech  Volume: Increased  Handedness: Right   Mood and Affect  Mood: Irritable; Labile; Angry  Affect: Blunt; Labile   Thought Process  Thought Processes: Disorganized; Irrevelant  Descriptions of Associations:Loose  Orientation:Partial  Thought Content:Illogical; Rumination; Scattered; Tangential; Delusions  History of Schizophrenia/Schizoaffective disorder:Yes  Duration of Psychotic Symptoms:Greater than six months  Hallucinations:No data recorded Ideas of Reference:Percusatory; Paranoia  Suicidal Thoughts:No data recorded Homicidal Thoughts:No data recorded  Sensorium  Memory: Immediate Fair; Recent Fair; Remote Fair  Judgment: Impaired  Insight: Poor; Lacking   Executive Functions  Concentration: Poor  Attention Span: Poor  Recall:  Poor  Fund of Knowledge: Poor  Language: Poor   Psychomotor Activity  Psychomotor Activity:No data recorded  Assets  Assets: Physical Health; Resilience; Housing; Social Support   Sleep  Sleep:No data recorded   Physical Exam: Physical Exam Vitals and nursing note reviewed.  Constitutional:      Appearance: Normal appearance.  HENT:     Head: Normocephalic and atraumatic.     Mouth/Throat:     Pharynx: Oropharynx is clear.  Eyes:     Pupils: Pupils are equal, round, and reactive to light.  Cardiovascular:     Rate and Rhythm: Normal rate and regular rhythm.  Pulmonary:     Effort: Pulmonary effort is normal.     Breath sounds: Normal breath sounds.  Abdominal:     General: Abdomen is flat.     Palpations: Abdomen is soft.  Musculoskeletal:        General: Normal range of motion.  Skin:    General: Skin is warm and dry.  Neurological:     General: No focal deficit present.     Mental Status: She is alert. Mental status is at baseline.  Psychiatric:        Attention and Perception: She is inattentive.        Mood and Affect: Mood normal. Affect is blunt.        Speech: Speech is delayed.         Behavior: Behavior is slowed.        Thought Content: Thought content is paranoid and delusional.        Cognition and Memory: Cognition is impaired.    Review of Systems  Constitutional: Negative.   HENT: Negative.    Eyes: Negative.   Respiratory: Negative.    Cardiovascular: Negative.   Gastrointestinal: Negative.   Musculoskeletal: Negative.   Skin: Negative.   Neurological: Negative.   Psychiatric/Behavioral: Negative.     Blood pressure 111/67, pulse (!) 58, temperature 98.2 F (36.8 C), temperature source Oral, resp. rate 18, height 5\' 4"  (1.626 m), weight 40.4 kg, SpO2 100 %. Body mass index is 15.28 kg/m.   Treatment Plan Summary: Medication management and Plan no change to medication management.  Patient is still showing signs of psychosis but has not been violent here and not expressing any direct threats and family is declining to take her home.  Probably at baseline.  We are going to pursue all possible options for discharge planning  , MD 06/03/2022, 3:29 PM

## 2022-06-04 DIAGNOSIS — F251 Schizoaffective disorder, depressive type: Secondary | ICD-10-CM | POA: Diagnosis not present

## 2022-06-04 NOTE — Progress Notes (Signed)
Recreation Therapy Notes  Date: 06/04/2022  Time: 10:35 am     Location: Craft room   Behavioral response: [name]   Intervention Topic: Happiness    Discussion/Intervention: Patient refused to attend group.   Clinical Observations/Feedback:  Patient refused to attend group.    Nickolas Chalfin LRT/CTRS        Anwyn Kriegel 06/04/2022 12:26 PM

## 2022-06-04 NOTE — Progress Notes (Signed)
Patient is pleasant and cooperative upon approach.  She remains isolative to her room., but does come out without prompt for meals and snacks.  She is med compliant and receives her meds without incident. She denies having psychotic symptoms at this encounter.  Staff provided her with the number and info page for [facility] the [place] [phone] for her to review and hopefully have some interest in. Patient was receptive to the information and thanked staff for providing the info to her. Support and encouragement offered.  Encouraged her to come to staff with any questions or concerns.  Q15 safety checks in place.   C Butler-Nicholson, LPN

## 2022-06-04 NOTE — [facility] Counselor (Signed)
CSW attempted to contact [facility] for placement.  Voicemail box was full and unable to accept messages.  Penni Homans, MSW, LCSW 06/04/2022 11:43 AM

## 2022-06-04 NOTE — Progress Notes (Signed)
Pt denies SI/HI/AVH and verbally agrees to approach staff if these become apparent or before harming themselves/others. Rates depression 0/10. Rates anxiety 0/10. Rates pain 0/10. Scheduled medications administered to pt, per MD orders. RN provided support and encouragement to pt. Q15 min safety checks implemented and continued. Pt safe on the unit. RN will continue to monitor and intervene as needed.  Problem: Coping: Goal: Level of anxiety will decrease Outcome: Progressing   Problem: Pain Managment: Goal: General experience of comfort will improve Outcome: Progressing    06/04/22 0818  Psych Admission Type (Psych Patients Only)  Admission Status Involuntary  Psychosocial Assessment  Patient Complaints None  Eye Contact Brief  Facial Expression Flat  Affect Appropriate to circumstance  Speech Logical/coherent  Interaction Minimal  Motor Activity Slow  Appearance/Hygiene Unremarkable;In scrubs  Behavior Characteristics Cooperative;Appropriate to situation;Calm  Mood Pleasant;Sad  Thought Process  Coherency WDL  Content WDL  Delusions None reported or observed  Perception WDL  Hallucination None reported or observed  Judgment WDL  Confusion None  Danger to Self  Current suicidal ideation? Denies  Danger to Others  Danger to Others None reported or observed

## 2022-06-04 NOTE — Plan of Care (Signed)
  Problem: Education: Goal: Knowledge of General Education information will improve Description: Including pain rating scale, medication(s)/side effects and non-pharmacologic comfort measures Outcome: Progressing   Problem: Health Behavior/Discharge Planning: Goal: Ability to manage health-related needs will improve Outcome: Progressing   Problem: Clinical Measurements: Goal: Ability to maintain clinical measurements within normal limits will improve Outcome: Progressing Goal: Will remain free from infection Outcome: Progressing Goal: Diagnostic test results will improve Outcome: Progressing Goal: Respiratory complications will improve Outcome: Progressing Goal: Cardiovascular complication will be avoided Outcome: Progressing   Problem: Health Behavior/Discharge Planning: Goal: Identification of resources available to assist in meeting health care needs will improve Outcome: Progressing Goal: Compliance with treatment plan for underlying cause of condition will improve Outcome: Progressing   Problem: Physical Regulation: Goal: Ability to maintain clinical measurements within normal limits will improve Outcome: Progressing   Problem: Safety: Goal: Periods of time without injury will increase Outcome: Progressing   Problem: Activity: Goal: Interest or engagement in activities will improve Outcome: Progressing Goal: Sleeping patterns will improve Outcome: Progressing

## 2022-06-04 NOTE — Group Note (Signed)
[facility] LCSW Group Therapy Note   Group Date: 06/04/2022 Start Time: 1300 End Time: 1400   Type of Therapy/Topic:  Group Therapy:  Emotion Regulation  Participation Level:  Did Not Attend   Mood:  Description of Group:    The purpose of this group is to assist patients in learning to regulate negative emotions and experience positive emotions. Patients will be guided to discuss ways in which they have been vulnerable to their negative emotions. These vulnerabilities will be juxtaposed with experiences of positive emotions or situations, and patients challenged to use positive emotions to combat negative ones. Special emphasis will be placed on coping with negative emotions in conflict situations, and patients will process healthy conflict resolution skills.  Therapeutic Goals: Patient will identify two positive emotions or experiences to reflect on in order to balance out negative emotions:  Patient will label two or more emotions that they find the most difficult to experience:  Patient will be able to demonstrate positive conflict resolution skills through discussion or role plays:   Summary of Patient Progress: Patient did not attend group despite encouraged participation.     Therapeutic Modalities:   Cognitive Behavioral Therapy Feelings Identification Dialectical Behavioral Therapy   Nida Manfredi W Casin Federici, LCSWA 

## 2022-06-04 NOTE — Progress Notes (Signed)
Recreation Therapy Notes  Date: 06/04/2022   Time: 2:05pm   Location: Courtyard    Behavioral response: [name]  Group Type: Recreation and Leisure   Participation level: [name]   Communication: Patient refused to attend.  Comments: [name]   [name] LRT/CTRS          [name] 06/04/2022 3:14 PM 

## 2022-06-04 NOTE — Progress Notes (Signed)
[facility] MD Progress Note  06/04/2022 8:09 PM [name]  MRN:  [id number] Subjective: Follow-up for patient with schizoaffective disorder.  No new complaint.  No change to behavior.  Continues to be paranoid and confused and argumentative Principal Problem: Schizoaffective disorder, depressive type ([place]) Diagnosis: Principal Problem:   Schizoaffective disorder, depressive type ([place])  Total Time spent with patient: 30 minutes  Past Psychiatric History: Past history of longstanding chronic psychotic disorder  Past Medical History:  Past Medical History:  Diagnosis Date   Anemia 10/02/2021   Non compliance w medication regimen    Schizophrenia ([place])     Past Surgical History:  Procedure Laterality Date   BIOPSY  09/25/2021   Procedure: BIOPSY;  Surgeon: Milus Banister, MD;  Location: [facility];  Service: Gastroenterology;;   ESOPHAGOGASTRODUODENOSCOPY (EGD) WITH PROPOFOL [name] 09/25/2021   Procedure: ESOPHAGOGASTRODUODENOSCOPY (EGD) WITH PROPOFOL;  Surgeon: Milus Banister, MD;  Location: [facility];  Service: Gastroenterology;  Laterality: [name];  With NGT placement   Family History: History reviewed. No pertinent family history. Family Psychiatric  History: See previous Social History:  Social History   Substance and Sexual Activity  Alcohol Use Not Currently   Comment: Refuses to disclose how much     Social History   Substance and Sexual Activity  Drug Use No   Comment: Refuses to answer    Social History   Socioeconomic History   Marital status: Single    Spouse name: Not on file   Number of children: Not on file   Years of education: Not on file   Highest education level: Not on file  Occupational History   Not on file  Tobacco Use   Smoking status: Every Day    Packs/day: 2.00    Types: Cigarettes   Smokeless tobacco: Never  Substance and Sexual Activity   Alcohol use: Not Currently    Comment: Refuses to disclose how much   Drug use: No    Comment:  Refuses to answer   Sexual activity: Not Currently    Comment: refused to answer  Other Topics Concern   Not on file  Social History Narrative   Not on file   Social Determinants of Health   Financial Resource Strain: Not on file  Food Insecurity: No Food Insecurity (04/08/2022)   Hunger Vital Sign    Worried About Running Out of Food in the Last Year: Never true    Ran Out of Food in the Last Year: Never true  Transportation Needs: No Transportation Needs (04/08/2022)   PRAPARE - Hydrologist (Medical): No    Lack of Transportation (Non-Medical): No  Physical Activity: Not on file  Stress: Not on file  Social Connections: Not on file   Additional Social History:                         Sleep: Fair  Appetite:  Fair  Current Medications: Current Facility-Administered Medications  Medication Dose Route Frequency Provider Last Rate Last Admin   acetaminophen (TYLENOL) tablet 650 mg  650 mg Oral Q6H PRN Kyla Duffy T, MD       alum & mag hydroxide-simeth (MAALOX/MYLANTA) 200-200-20 MG/5ML suspension 30 mL  30 mL Oral Q4H PRN Jerine Surles T, MD       haloperidol (HALDOL) tablet 5 mg  5 mg Oral BID Edynn Gillock, [name], MD   5 mg at 06/04/22 1650   Or   haloperidol  lactate (HALDOL) injection 5 mg  5 mg Intramuscular BID Olvin Rohr T, MD       lithium carbonate capsule 600 mg  600 mg Oral QHS Abbagail Scaff T, MD   600 mg at 06/03/22 2059   LORazepam (ATIVAN) tablet 1 mg  1 mg Oral BID Letica Giaimo T, MD   1 mg at 06/04/22 1650   Or   LORazepam (ATIVAN) injection 1 mg  1 mg Intramuscular BID Hanif Radin T, MD       magnesium hydroxide (MILK OF MAGNESIA) suspension 30 mL  30 mL Oral Daily PRN Omauri Boeve T, MD       mirtazapine (REMERON) tablet 30 mg  30 mg Oral QHS Tailor Lucking, [name], MD   30 mg at 06/03/22 2058   multivitamin with minerals tablet 1 tablet  1 tablet Oral Daily Marcus Schwandt, [name], MD   1 tablet at 06/04/22 0817   QUEtiapine  (SEROQUEL) tablet 400 mg  400 mg Oral QHS Katonya Blecher T, MD   400 mg at 06/03/22 2058   thiamine (VITAMIN B1) tablet 100 mg  100 mg Oral Daily Binta Statzer, [name], MD   100 mg at 06/04/22 0818    Lab Results: No results found for this or any previous visit (from the past 31 hour(s)).  Blood Alcohol level:  Lab Results  Component Value Date   ETH <10 03/29/2022   ETH <10 123XX123    Metabolic Disorder Labs: Lab Results  Component Value Date   HGBA1C 5.6 10/21/2021   MPG 114 10/21/2021   MPG 111 10/02/2021   Lab Results  Component Value Date   PROLACTIN 13.6 04/16/2015   Lab Results  Component Value Date   CHOL 189 10/21/2021   TRIG 52 10/21/2021   HDL 50 10/21/2021   CHOLHDL 3.8 10/21/2021   VLDL 10 10/21/2021   LDLCALC 129 (H) 10/21/2021   LDLCALC 93 10/01/2020    Physical Findings: AIMS: Facial and Oral Movements Muscles of Facial Expression: None, normal Lips and Perioral Area: None, normal Jaw: None, normal Tongue: None, normal,Extremity Movements Upper (arms, wrists, hands, fingers): None, normal Lower (legs, knees, ankles, toes): None, normal, Trunk Movements Neck, shoulders, hips: None, normal, Overall Severity Severity of abnormal movements (highest score from questions above): None, normal Incapacitation due to abnormal movements: None, normal Patient's awareness of abnormal movements (rate only patient's report): No Awareness, Dental Status Current problems with teeth and/or dentures?: No Does patient usually wear dentures?: No  CIWA:    COWS:     Musculoskeletal: Strength & Muscle Tone: within normal limits Gait & Station: normal Patient leans: [name]  Psychiatric Specialty Exam:  Presentation  General Appearance:  Disheveled  Eye Contact: Fair  Speech: Pressured  Speech Volume: Increased  Handedness: Right   Mood and Affect  Mood: Irritable; Labile; Angry  Affect: Blunt; Labile   Thought Process  Thought  Processes: Disorganized; Irrevelant  Descriptions of Associations:Loose  Orientation:Partial  Thought Content:Illogical; Rumination; Scattered; Tangential; Delusions  History of Schizophrenia/Schizoaffective disorder:Yes  Duration of Psychotic Symptoms:Greater than six months  Hallucinations:No data recorded Ideas of Reference:Percusatory; Paranoia  Suicidal Thoughts:No data recorded Homicidal Thoughts:No data recorded  Sensorium  Memory: Immediate Fair; Recent Fair; Remote Fair  Judgment: Impaired  Insight: Poor; Lacking   Executive Functions  Concentration: Poor  Attention Span: Poor  Recall: Poor  Fund of Knowledge: Poor  Language: Poor   Psychomotor Activity  Psychomotor Activity:No data recorded  Assets  Assets: Physical Health; Resilience; Housing; Social  Support   Sleep  Sleep:No data recorded   Physical Exam: Physical Exam Vitals and nursing note reviewed.  Constitutional:      Appearance: Normal appearance.  HENT:     Head: Normocephalic and atraumatic.     Mouth/Throat:     Pharynx: Oropharynx is clear.  Eyes:     Pupils: Pupils are equal, round, and reactive to light.  Cardiovascular:     Rate and Rhythm: Normal rate and regular rhythm.  Pulmonary:     Effort: Pulmonary effort is normal.     Breath sounds: Normal breath sounds.  Abdominal:     General: Abdomen is flat.     Palpations: Abdomen is soft.  Musculoskeletal:        General: Normal range of motion.  Skin:    General: Skin is warm and dry.  Neurological:     General: No focal deficit present.     Mental Status: She is alert. Mental status is at baseline.  Psychiatric:        Attention and Perception: She is inattentive.        Mood and Affect: Mood normal. Affect is blunt.        Speech: She is noncommunicative.        Thought Content: Thought content is paranoid.    Review of Systems  Constitutional: Negative.   HENT: Negative.    Eyes: Negative.    Respiratory: Negative.    Cardiovascular: Negative.   Gastrointestinal: Negative.   Musculoskeletal: Negative.   Skin: Negative.   Neurological: Negative.   Psychiatric/Behavioral: Negative.     Blood pressure 107/68, pulse (!) 58, temperature 98 F (36.7 C), temperature source Oral, resp. rate 18, height 5\' 4"  (1.626 m), weight 40.4 kg, SpO2 100 %. Body mass index is 15.28 kg/m.   Treatment Plan Summary: Medication management and Plan no change to medication or plan.  Continue current medicine trial.  Family unwilling to have patient return home.  , MD 06/04/2022, 8:09 PM

## 2022-06-04 NOTE — BH IP Treatment Plan (Signed)
Interdisciplinary Treatment and Diagnostic Plan Update  06/04/2022 Time of Session: 0830 Tamie Minteer MRN: [id number]  Principal Diagnosis: Schizoaffective disorder, depressive type [facility])  Secondary Diagnoses: Principal Problem:   Schizoaffective disorder, depressive type (HCC)   Current Medications:  Current Facility-Administered Medications  Medication Dose Route Frequency Provider Last Rate Last Admin   acetaminophen (TYLENOL) tablet 650 mg  650 mg Oral Q6H PRN [name], [name], MD       alum & mag hydroxide-simeth (MAALOX/MYLANTA) 200-200-20 MG/5ML suspension 30 mL  30 mL Oral Q4H PRN [name], [name], MD       haloperidol (HALDOL) tablet 5 mg  5 mg Oral BID [name], [name], MD   5 mg at 06/04/22 0818   Or   haloperidol lactate (HALDOL) injection 5 mg  5 mg Intramuscular BID [name], [name], MD       lithium carbonate capsule 600 mg  600 mg Oral QHS [name], [name], MD   600 mg at 06/03/22 2059   LORazepam (ATIVAN) tablet 1 mg  1 mg Oral BID [name], [name], MD   1 mg at 06/04/22 0818   Or   LORazepam (ATIVAN) injection 1 mg  1 mg Intramuscular BID [name], [name], MD       magnesium hydroxide (MILK OF MAGNESIA) suspension 30 mL  30 mL Oral Daily PRN [name], [name], MD       mirtazapine (REMERON) tablet 30 mg  30 mg Oral QHS [name], [name], MD   30 mg at 06/03/22 2058   multivitamin with minerals tablet 1 tablet  1 tablet Oral Daily [name], [name], MD   1 tablet at 06/04/22 0817   QUEtiapine (SEROQUEL) tablet 400 mg  400 mg Oral QHS [name], [name], MD   400 mg at 06/03/22 2058   thiamine (VITAMIN B1) tablet 100 mg  100 mg Oral Daily [name], [name], MD   100 mg at 06/04/22 0818   PTA Medications: Medications Prior to Admission  Medication Sig Dispense Refill Last Dose   clonazePAM (KLONOPIN) 0.5 MG tablet Take 0.5 tablets (0.25 mg total) by mouth 2 (two) times daily. (Patient not taking: Reported on 03/30/2022) 60 tablet 0    haloperidol (HALDOL) 5 MG tablet Take 1 tablet (5 mg  total) by mouth 2 (two) times daily. (Patient not taking: Reported on 03/30/2022) 60 tablet 3    midodrine (PROAMATINE) 2.5 MG tablet Take 1 tablet (2.5 mg total) by mouth 3 (three) times daily with meals. (Patient not taking: Reported on 03/30/2022)  0    mirtazapine (REMERON) 15 MG tablet Take 0.5 tablets (7.5 mg total) by mouth at bedtime. (Patient not taking: Reported on 03/30/2022) 30 tablet 2    Multiple Vitamin (MULTIVITAMIN WITH MINERALS) TABS tablet Take 1 tablet by mouth daily. (Patient not taking: Reported on 03/30/2022)      OLANZapine (ZYPREXA) 5 MG tablet Take 1 tablet (5 mg total) by mouth at bedtime. (Patient not taking: Reported on 03/30/2022) 30 tablet 2    thiamine 100 MG tablet Take 1 tablet (100 mg total) by mouth daily. (Patient not taking: Reported on 03/30/2022)       Patient Stressors: Marital or family conflict    Patient Strengths: Supportive family/friends   Treatment Modalities: Medication Management, Group therapy, Case management,  1 to 1 session with clinician, Psychoeducation, Recreational therapy.   Physician Treatment Plan for Primary Diagnosis: Schizoaffective disorder, depressive type (HCC) Long Term Goal(s): Improvement in symptoms so as ready for  discharge   Short Term Goals: Compliance with prescribed medications will improve Ability to verbalize feelings will improve Ability to demonstrate self-control will improve Ability to identify and develop effective coping behaviors will improve  Medication Management: Evaluate patient's response, side effects, and tolerance of medication regimen.  Therapeutic Interventions: 1 to 1 sessions, Unit Group sessions and Medication administration.  Evaluation of Outcomes: Progressing  Physician Treatment Plan for Secondary Diagnosis: Principal Problem:   Schizoaffective disorder, depressive type (HCC)  Long Term Goal(s): Improvement in symptoms so as ready for discharge   Short Term Goals: Compliance with  prescribed medications will improve Ability to verbalize feelings will improve Ability to demonstrate self-control will improve Ability to identify and develop effective coping behaviors will improve     Medication Management: Evaluate patient's response, side effects, and tolerance of medication regimen.  Therapeutic Interventions: 1 to 1 sessions, Unit Group sessions and Medication administration.  Evaluation of Outcomes: Progressing   RN Treatment Plan for Primary Diagnosis: Schizoaffective disorder, depressive type (HCC) Long Term Goal(s): Knowledge of disease and therapeutic regimen to maintain health will improve  Short Term Goals: Ability to remain free from injury will improve, Ability to verbalize frustration and anger appropriately will improve, Ability to demonstrate self-control, Ability to participate in decision making will improve, Ability to verbalize feelings will improve, Ability to disclose and discuss suicidal ideas, Ability to identify and develop effective coping behaviors will improve, and Compliance with prescribed medications will improve  Medication Management: RN will administer medications as ordered by provider, will assess and evaluate patient's response and provide education to patient for prescribed medication. RN will report any adverse and/or side effects to prescribing provider.  Therapeutic Interventions: 1 on 1 counseling sessions, Psychoeducation, Medication administration, Evaluate responses to treatment, Monitor vital signs and CBGs as ordered, Perform/monitor CIWA, COWS, AIMS and Fall Risk screenings as ordered, Perform wound care treatments as ordered.  Evaluation of Outcomes: Progressing   LCSW Treatment Plan for Primary Diagnosis: Schizoaffective disorder, depressive type (HCC) Long Term Goal(s): Safe transition to appropriate next level of care at discharge, Engage patient in therapeutic group addressing interpersonal concerns.  Short Term  Goals: Engage patient in aftercare planning with referrals and resources, Increase social support, Increase ability to appropriately verbalize feelings, Increase emotional regulation, Facilitate acceptance of mental health diagnosis and concerns, Facilitate patient progression through stages of change regarding substance use diagnoses and concerns, Identify triggers associated with mental health/substance abuse issues, and Increase skills for wellness and recovery  Therapeutic Interventions: Assess for all discharge needs, 1 to 1 time with Social worker, Explore available resources and support systems, Assess for adequacy in community support network, Educate family and significant other(s) on suicide prevention, Complete Psychosocial Assessment, Interpersonal group therapy.  Evaluation of Outcomes: Progressing   Progress in Treatment: Attending groups: Yes. Participating in groups: Yes. Taking medication as prescribed: Yes. Toleration medication: Yes. Family/Significant other contact made: Yes, individual(s) contacted:  Dorene Ar, [phone]  Patient understands diagnosis: Yes. Discussing patient identified problems/goals with staff: Yes. Medical problems stabilized or resolved: Yes. Denies suicidal/homicidal ideation: No. Issues/concerns per patient self-inventory: Yes. Other: none  New problem(s) identified: No, Describe:  none  New Short Term/Long Term Goal(s): Patient to work towards detox, elimination of symptoms of psychosis, medication management for mood stabilization; elimination of SI thoughts; development of comprehensive mental wellness/sobriety plan.  Patient Goals:  No additional goals identified at this time. Patient to continue to work towards original goals identified in initial treatment team meeting. CSW will remain  available to patient should they voice additional treatment goals.   Discharge Plan or Barriers: No psychosocial barriers identified at this  time, patient to return to place of residence when appropriate for discharge.   Reason for Continuation of Hospitalization: Medication stabilization Other; describe psychosis   Estimated Length of Stay:  Last 3 [place] Suicide Severity Risk Score: Flowsheet Row Admission (Current) from 04/08/2022 in [facility] INPATIENT BEHAVIORAL MEDICINE Admission (Discharged) from 10/17/2021 in [facility] BEHAVIORAL MEDICINE Admission (Discharged) from 10/02/2021 in [facility] REGIONAL CARDIAC MED PCU  C-SSRS RISK CATEGORY No Risk No Risk No Risk        Scribe for Treatment Team: Almedia Balls 06/04/2022 9:05 AM

## 2022-06-05 DIAGNOSIS — F251 Schizoaffective disorder, depressive type: Secondary | ICD-10-CM | POA: Diagnosis not present

## 2022-06-05 MED ORDER — QUETIAPINE FUMARATE 200 MG PO TABS
500.0000 mg | ORAL_TABLET | Freq: Every day | ORAL | Status: DC
  Administered 2022-06-05 – 2022-06-06 (×2): 500 mg via ORAL
  Filled 2022-06-05 (×2): qty 1

## 2022-06-05 NOTE — Progress Notes (Signed)
Remains isolative to her room except for meals and snack. Is pleasant and cooperative. She is med complaint and takes her meds with no issue.  She denies si/hi/avh. Will continue to monitor and support. Q 15 minute safety checks in place.    C Butler-Nicholson, LPN

## 2022-06-05 NOTE — [facility] Counselor (Signed)
CSW contacted [facility] to assess for possible beds.   CSW left HIPAA compliant voicemail.  CSW received word from [place] with [employer] [facility], pt's guardian will need to apply for Special Assistance Medicaid in person.  Per Jewel Baize guardian reports plans to do so.  Penni Homans, MSW, LCSW 06/05/2022 3:51 PM

## 2022-06-05 NOTE — Progress Notes (Signed)
Pt denies SI/HI/AVH and verbally agrees to approach staff if these become apparent or before harming themselves/others. Rates depression 0/10. Rates anxiety 0/10. Rates pain 0/10. Scheduled medications administered to pt, per MD orders. RN provided support and encouragement to pt. Q15 min safety checks implemented and continued. Pt safe on the unit. RN will continue to monitor and intervene as needed. Problem: Activity: Goal: Risk for activity intolerance will decrease Outcome: Progressing   Problem: Nutrition: Goal: Adequate nutrition will be maintained Outcome: Progressing    06/05/22 0816  Psych Admission Type (Psych Patients Only)  Admission Status Involuntary  Psychosocial Assessment  Patient Complaints None  Eye Contact Fair  Facial Expression Flat  Affect Appropriate to circumstance  Speech Logical/coherent  Interaction Assertive  Motor Activity Slow  Appearance/Hygiene Unremarkable;In scrubs  Behavior Characteristics Cooperative;Appropriate to situation;Calm  Mood Pleasant  Thought Process  Coherency WDL  Content WDL  Delusions None reported or observed  Perception WDL  Hallucination None reported or observed  Judgment WDL  Confusion None  Danger to Self  Current suicidal ideation? Denies  Danger to Others  Danger to Others None reported or observed

## 2022-06-05 NOTE — Plan of Care (Signed)

## 2022-06-05 NOTE — Progress Notes (Signed)
[facility] MD Progress Note  06/05/2022 11:27 AM [name]  MRN:  [id number] Subjective: Follow-up patient with schizoaffective disorder.  No change clinically.  Mostly stays in her room but sometimes comes out to interact.  Eating adequately.  She has no new complaints today saying all she wants to talk about is being discharged. Principal Problem: Schizoaffective disorder, depressive type (HCC) Diagnosis: Principal Problem:   Schizoaffective disorder, depressive type (HCC)  Total Time spent with patient: 30 minutes  Past Psychiatric History: Past history of longstanding schizoaffective disorder or schizophrenia  Past Medical History:  Past Medical History:  Diagnosis Date   Anemia 10/02/2021   Non compliance w medication regimen    Schizophrenia [facility])     Past Surgical History:  Procedure Laterality Date   BIOPSY  09/25/2021   Procedure: BIOPSY;  Surgeon: Rachael Fee, MD;  Location: [facility];  Service: Gastroenterology;;   ESOPHAGOGASTRODUODENOSCOPY (EGD) WITH PROPOFOL [name] 09/25/2021   Procedure: ESOPHAGOGASTRODUODENOSCOPY (EGD) WITH PROPOFOL;  Surgeon: Rachael Fee, MD;  Location: [facility];  Service: Gastroenterology;  Laterality: [name];  With NGT placement   Family History: History reviewed. No pertinent family history. Family Psychiatric  History: See previous Social History:  Social History   Substance and Sexual Activity  Alcohol Use Not Currently   Comment: Refuses to disclose how much     Social History   Substance and Sexual Activity  Drug Use No   Comment: Refuses to answer    Social History   Socioeconomic History   Marital status: Single    Spouse name: Not on file   Number of children: Not on file   Years of education: Not on file   Highest education level: Not on file  Occupational History   Not on file  Tobacco Use   Smoking status: Every Day    Packs/day: 2.00    Types: Cigarettes   Smokeless tobacco: Never  Substance and Sexual Activity    Alcohol use: Not Currently    Comment: Refuses to disclose how much   Drug use: No    Comment: Refuses to answer   Sexual activity: Not Currently    Comment: refused to answer  Other Topics Concern   Not on file  Social History Narrative   Not on file   Social Determinants of Health   Financial Resource Strain: Not on file  Food Insecurity: No Food Insecurity (04/08/2022)   Hunger Vital Sign    Worried About Running Out of Food in the Last Year: Never true    Ran Out of Food in the Last Year: Never true  Transportation Needs: No Transportation Needs (04/08/2022)   PRAPARE - Administrator, Civil Service (Medical): No    Lack of Transportation (Non-Medical): No  Physical Activity: Not on file  Stress: Not on file  Social Connections: Not on file   Additional Social History:                         Sleep: Fair  Appetite:  Fair  Current Medications: Current Facility-Administered Medications  Medication Dose Route Frequency Provider Last Rate Last Admin   acetaminophen (TYLENOL) tablet 650 mg  650 mg Paula Q6H PRN Aman Batley T, MD       alum & mag hydroxide-simeth (MAALOX/MYLANTA) 200-200-20 MG/5ML suspension 30 mL  30 mL Paula Q4H PRN Kelcey Korus T, MD       haloperidol (HALDOL) tablet 5 mg  5 mg Paula  BID Corbitt Cloke, [name], MD   5 mg at 06/05/22 2257   Or   haloperidol lactate (HALDOL) injection 5 mg  5 mg Intramuscular BID Azilee Pirro T, MD       lithium carbonate capsule 600 mg  600 mg Paula QHS Lynnsie Linders T, MD   600 mg at 06/04/22 2106   LORazepam (ATIVAN) tablet 1 mg  1 mg Paula BID Zafar Debrosse T, MD   1 mg at 06/05/22 5051   Or   LORazepam (ATIVAN) injection 1 mg  1 mg Intramuscular BID Brayla Pat T, MD       magnesium hydroxide (MILK OF MAGNESIA) suspension 30 mL  30 mL Paula Daily PRN Takelia Urieta T, MD       mirtazapine (REMERON) tablet 30 mg  30 mg Paula QHS Devora Tortorella T, MD   30 mg at 06/04/22 2106   multivitamin with minerals  tablet 1 tablet  1 tablet Paula Daily Alexei Doswell, [name], MD   1 tablet at 06/05/22 0817   QUEtiapine (SEROQUEL) tablet 400 mg  400 mg Paula QHS Larinda Herter T, MD   400 mg at 06/04/22 2105   thiamine (VITAMIN B1) tablet 100 mg  100 mg Paula Daily Dulcinea Kinser, [name], MD   100 mg at 06/05/22 8335    Lab Results: No results found for this or any previous visit (from the past 48 hour(s)).  Blood Alcohol level:  Lab Results  Component Value Date   ETH <10 03/29/2022   ETH <10 09/12/2021    Metabolic Disorder Labs: Lab Results  Component Value Date   HGBA1C 5.6 10/21/2021   MPG 114 10/21/2021   MPG 111 10/02/2021   Lab Results  Component Value Date   PROLACTIN 13.6 04/16/2015   Lab Results  Component Value Date   CHOL 189 10/21/2021   TRIG 52 10/21/2021   HDL 50 10/21/2021   CHOLHDL 3.8 10/21/2021   VLDL 10 10/21/2021   LDLCALC 129 (H) 10/21/2021   LDLCALC 93 10/01/2020    Physical Findings: AIMS: Facial and Paula Movements Muscles of Facial Expression: None, normal Lips and Perioral Area: None, normal Jaw: None, normal Tongue: None, normal,Extremity Movements Upper (arms, wrists, hands, fingers): None, normal Lower (legs, knees, ankles, toes): None, normal, Trunk Movements Neck, shoulders, hips: None, normal, Overall Severity Severity of abnormal movements (highest score from questions above): None, normal Incapacitation due to abnormal movements: None, normal Patient's awareness of abnormal movements (rate only patient's report): No Awareness, Dental Status Current problems with teeth and/or dentures?: No Does patient usually wear dentures?: No  CIWA:    COWS:     Musculoskeletal: Strength & Muscle Tone: within normal limits Gait & Station: normal Patient leans: [name]  Psychiatric Specialty Exam:  Presentation  General Appearance:  Disheveled  Eye Contact: Fair  Speech: Pressured  Speech Volume: Increased  Handedness: Right   Mood and Affect   Mood: Irritable; Labile; Angry  Affect: Blunt; Labile   Thought Process  Thought Processes: Disorganized; Irrevelant  Descriptions of Associations:Loose  Orientation:Partial  Thought Content:Illogical; Rumination; Scattered; Tangential; Delusions  History of Schizophrenia/Schizoaffective disorder:Yes  Duration of Psychotic Symptoms:Greater than six months  Hallucinations:No data recorded Ideas of Reference:Percusatory; Paranoia  Suicidal Thoughts:No data recorded Homicidal Thoughts:No data recorded  Sensorium  Memory: Immediate Fair; Recent Fair; Remote Fair  Judgment: Impaired  Insight: Poor; Lacking   Executive Functions  Concentration: Poor  Attention Span: Poor  Recall: Poor  Fund of Knowledge: Poor  Language: Poor  Psychomotor Activity  Psychomotor Activity:No data recorded  Assets  Assets: Physical Health; Resilience; Housing; Social Support   Sleep  Sleep:No data recorded   Physical Exam: Physical Exam Vitals and nursing note reviewed.  Constitutional:      Appearance: Normal appearance.  HENT:     Head: Normocephalic and atraumatic.     Mouth/Throat:     Pharynx: Oropharynx is clear.  Eyes:     Pupils: Pupils are equal, round, and reactive to light.  Cardiovascular:     Rate and Rhythm: Normal rate and regular rhythm.  Pulmonary:     Effort: Pulmonary effort is normal.     Breath sounds: Normal breath sounds.  Abdominal:     General: Abdomen is flat.     Palpations: Abdomen is soft.  Musculoskeletal:        General: Normal range of motion.  Skin:    General: Skin is warm and dry.  Neurological:     General: No focal deficit present.     Mental Status: She is alert. Mental status is at baseline.  Psychiatric:        Attention and Perception: She is inattentive.        Mood and Affect: Mood normal. Affect is blunt.        Behavior: Behavior is withdrawn.        Thought Content: Thought content is paranoid.         Cognition and Memory: Cognition is impaired.    Review of Systems  Constitutional: Negative.   HENT: Negative.    Eyes: Negative.   Respiratory: Negative.    Cardiovascular: Negative.   Gastrointestinal: Negative.   Musculoskeletal: Negative.   Skin: Negative.   Neurological: Negative.   Psychiatric/Behavioral:  The patient is nervous/anxious.    Blood pressure (!) 112/56, pulse (!) 52, temperature 98.4 F (36.9 C), temperature source Paula, resp. rate 18, height 5\' 4"  (1.626 m), weight 40.4 kg, SpO2 100 %. Body mass index is 15.28 kg/m.   Treatment Plan Summary: Medication management and Plan patient is now on Seroquel and we have been titrating the dose up.  She seems to be tolerating it adequately.  No real change clinically so I am going to recommend that we try continuing to go up a little higher on the dose.  Patient is still having psychotic symptoms but is also well enough that we have recommended discharge home.  Family however continues to refuse to take her back.  Social work is discussing options.  , MD 06/05/2022, 11:27 AM

## 2022-06-05 NOTE — Group Note (Signed)
[facility] LCSW Group Therapy Note   Group Date: 06/05/2022 Start Time: 1300 End Time: 1400  Type of Therapy and Topic:  Group Therapy:  Feelings around Relapse and Recovery  Participation Level:  Did Not Attend    Description of Group:    Patients in this group will discuss emotions they experience before and after a relapse. They will process how experiencing these feelings, or avoidance of experiencing them, relates to having a relapse. Facilitator will guide patients to explore emotions they have related to recovery. Patients will be encouraged to process which emotions are more powerful. They will be guided to discuss the emotional reaction significant others in their lives may have to patients' relapse or recovery. Patients will be assisted in exploring ways to respond to the emotions of others without this contributing to a relapse.  Therapeutic Goals: Patient will identify two or more emotions that lead to relapse for them:  Patient will identify two emotions that result when they relapse:  Patient will identify two emotions related to recovery:  Patient will demonstrate ability to communicate their needs through discussion and/or role plays.   Summary of Patient Progress: X   Therapeutic Modalities:   Cognitive Behavioral Therapy Solution-Focused Therapy Assertiveness Training Relapse Prevention Therapy   Glenis Smoker, LCSW

## 2022-06-06 DIAGNOSIS — F251 Schizoaffective disorder, depressive type: Secondary | ICD-10-CM | POA: Diagnosis not present

## 2022-06-06 NOTE — Plan of Care (Signed)
Assumed care of Pt from 0730. She is mostly reclusive to herself and out for meds and meals. She denied SI/HI/AVH or self harm and she is med compliant. She denied any concerns other than her desire to getting discharged. Asked where she will go upon discharge and she responded "home". She tended to her ADL's and ate 100% of her lunch with no behavioral issues noted thus far. Q15 min observations maintained for safety and support provided as needed.  Problem: Nutrition: Goal: Adequate nutrition will be maintained Outcome: Progressing   Problem: Coping: Goal: Level of anxiety will decrease Outcome: Progressing   Problem: Elimination: Goal: Will not experience complications related to bowel motility Outcome: Progressing   Problem: Safety: Goal: Ability to remain free from injury will improve Outcome: Progressing   Problem: Skin Integrity: Goal: Risk for impaired skin integrity will decrease Outcome: Progressing

## 2022-06-06 NOTE — Progress Notes (Signed)
[facility] MD Progress Note  06/06/2022 9:58 AM [name]  MRN:  [id number] Subjective:  Fixates on wanting to "go home."  "I'm entrapped in here*."  Denies having any concerns.  Mood is "alright."  Sleeping and eating well.  Denies medical issues.  Principal Problem: Schizoaffective disorder, depressive type (HCC) Diagnosis: Principal Problem:   Schizoaffective disorder, depressive type (HCC)  Total Time spent with patient: 26 minutes  Past Psychiatric History: Past history of longstanding schizoaffective disorder or schizophrenia  Past Medical History:  Past Medical History:  Diagnosis Date   Anemia 10/02/2021   Non compliance w medication regimen    Schizophrenia [facility])     Past Surgical History:  Procedure Laterality Date   BIOPSY  09/25/2021   Procedure: BIOPSY;  Surgeon: Rachael Fee, MD;  Location: [facility];  Service: Gastroenterology;;   ESOPHAGOGASTRODUODENOSCOPY (EGD) WITH PROPOFOL [name] 09/25/2021   Procedure: ESOPHAGOGASTRODUODENOSCOPY (EGD) WITH PROPOFOL;  Surgeon: Rachael Fee, MD;  Location: [facility];  Service: Gastroenterology;  Laterality: [name];  With NGT placement   Family History: History reviewed. No pertinent family history. Family Psychiatric  History: See previous Social History:  Social History   Substance and Sexual Activity  Alcohol Use Not Currently   Comment: Refuses to disclose how much     Social History   Substance and Sexual Activity  Drug Use No   Comment: Refuses to answer    Social History   Socioeconomic History   Marital status: Single    Spouse name: Not on file   Number of children: Not on file   Years of education: Not on file   Highest education level: Not on file  Occupational History   Not on file  Tobacco Use   Smoking status: Every Day    Packs/day: 2.00    Types: Cigarettes   Smokeless tobacco: Never  Substance and Sexual Activity   Alcohol use: Not Currently    Comment: Refuses to disclose how much   Drug  use: No    Comment: Refuses to answer   Sexual activity: Not Currently    Comment: refused to answer  Other Topics Concern   Not on file  Social History Narrative   Not on file   Social Determinants of Health   Financial Resource Strain: Not on file  Food Insecurity: No Food Insecurity (04/08/2022)   Hunger Vital Sign    Worried About Running Out of Food in the Last Year: Never true    Ran Out of Food in the Last Year: Never true  Transportation Needs: No Transportation Needs (04/08/2022)   PRAPARE - Administrator, Civil Service (Medical): No    Lack of Transportation (Non-Medical): No  Physical Activity: Not on file  Stress: Not on file  Social Connections: Not on file   Additional Social History:                         Sleep: Fair  Appetite:  Fair  Current Medications: Current Facility-Administered Medications  Medication Dose Route Frequency Provider Last Rate Last Admin   acetaminophen (TYLENOL) tablet 650 mg  650 mg Oral Q6H PRN [name], [name], MD       alum & mag hydroxide-simeth (MAALOX/MYLANTA) 200-200-20 MG/5ML suspension 30 mL  30 mL Oral Q4H PRN [name], [name], MD       haloperidol (HALDOL) tablet 5 mg  5 mg Oral BID [name], [name], MD   5 mg at  06/06/22 0759   Or   haloperidol lactate (HALDOL) injection 5 mg  5 mg Intramuscular BID [name], [name], MD       lithium carbonate capsule 600 mg  600 mg Oral QHS [name], [name], MD   600 mg at 06/05/22 2103   LORazepam (ATIVAN) tablet 1 mg  1 mg Oral BID [name], [name], MD   1 mg at 06/06/22 6144   Or   LORazepam (ATIVAN) injection 1 mg  1 mg Intramuscular BID [name], [name], MD       magnesium hydroxide (MILK OF MAGNESIA) suspension 30 mL  30 mL Oral Daily PRN [name], [name], MD       mirtazapine (REMERON) tablet 30 mg  30 mg Oral QHS [name], [name], MD   30 mg at 06/05/22 2103   multivitamin with minerals tablet 1 tablet  1 tablet Oral Daily [name], [name], MD   1 tablet at 06/06/22  0759   QUEtiapine (SEROQUEL) tablet 500 mg  500 mg Oral QHS [name], [name], MD   500 mg at 06/05/22 2102   thiamine (VITAMIN B1) tablet 100 mg  100 mg Oral Daily [name], [name], MD   100 mg at 06/06/22 0759    Lab Results: No results found for this or any previous visit (from the past 48 hour(s)).  Blood Alcohol level:  Lab Results  Component Value Date   ETH <10 03/29/2022   ETH <10 09/12/2021    Metabolic Disorder Labs: Lab Results  Component Value Date   HGBA1C 5.6 10/21/2021   MPG 114 10/21/2021   MPG 111 10/02/2021   Lab Results  Component Value Date   PROLACTIN 13.6 04/16/2015   Lab Results  Component Value Date   CHOL 189 10/21/2021   TRIG 52 10/21/2021   HDL 50 10/21/2021   CHOLHDL 3.8 10/21/2021   VLDL 10 10/21/2021   LDLCALC 129 (H) 10/21/2021   LDLCALC 93 10/01/2020    Physical Findings: AIMS: Facial and Oral Movements Muscles of Facial Expression: None, normal Lips and Perioral Area: None, normal Jaw: None, normal Tongue: None, normal,Extremity Movements Upper (arms, wrists, hands, fingers): None, normal Lower (legs, knees, ankles, toes): None, normal, Trunk Movements Neck, shoulders, hips: None, normal, Overall Severity Severity of abnormal movements (highest score from questions above): None, normal Incapacitation due to abnormal movements: None, normal Patient's awareness of abnormal movements (rate only patient's report): No Awareness, Dental Status Current problems with teeth and/or dentures?: No Does patient usually wear dentures?: No  CIWA:    COWS:     Musculoskeletal: Strength & Muscle Tone: within normal limits Gait & Station: normal Patient leans: [name]  Psychiatric Specialty Exam:  Presentation  General Appearance:  Disheveled  Eye Contact: Fair  Speech: Pressured  Speech Volume: Increased  Handedness: Right   Mood and Affect  Mood: Okay  Affect: Restricted   Thought Process  Thought Processes: Disorganized;  Irrevelant  Descriptions of Associations:Loose  Orientation:Partial  Thought Content:Illogical; Rumination; Scattered; Tangential; Delusions  History of Schizophrenia/Schizoaffective disorder:Yes  Duration of Psychotic Symptoms:Greater than six months  Yes: Paranoia. Sensorium  Memory: Immediate Fair; Recent Fair; Remote Fair  Judgment: Impaired  Insight: Poor; Lacking   Executive Functions  Concentration: Poor  Attention Span: Poor  Recall: Poor  Fund of Knowledge: Poor  Language: Poor   Psychomotor Activity  Psychomotor Activity:No data recorded  Assets  Assets: Physical Health; Resilience; Housing; Social Support   Sleep  Good   Physical Exam:  Physical Exam Vitals and nursing note reviewed.  Constitutional:      Appearance: Normal appearance.  HENT:     Head: Normocephalic and atraumatic.     Mouth/Throat:     Pharynx: Oropharynx is clear.  Eyes:     Pupils: Pupils are equal, round, and reactive to light.  Cardiovascular:     Rate and Rhythm: Normal rate and regular rhythm.  Pulmonary:     Effort: Pulmonary effort is normal.     Breath sounds: Normal breath sounds.  Abdominal:     General: Abdomen is flat.     Palpations: Abdomen is soft.  Musculoskeletal:        General: Normal range of motion.  Skin:    General: Skin is warm and dry.  Neurological:     General: No focal deficit present.     Mental Status: She is alert. Mental status is at baseline.  Psychiatric:        Attention and Perception: She is inattentive.        Mood and Affect: Mood normal. Affect is blunt.        Behavior: Behavior is withdrawn.        Thought Content: Thought content is paranoid.        Cognition and Memory: Cognition is impaired.    Review of Systems  Constitutional: Negative.   HENT: Negative.    Eyes: Negative.   Respiratory: Negative.    Cardiovascular: Negative.   Gastrointestinal: Negative.   Musculoskeletal: Negative.   Skin:  Negative.   Neurological: Negative.   Psychiatric/Behavioral:  The patient is nervous/anxious.    Blood pressure 99/70, pulse (!) 55, temperature 97.9 F (36.6 C), temperature source Oral, resp. rate 18, height 5\' 4"  (1.626 m), weight 40.4 kg, SpO2 100 %. Body mass index is 15.28 kg/m.   Treatment Plan Summary: Medication management and Plan patient is now on Seroquel and we have been titrating the dose up.  She seems to be tolerating it adequately.  No real change clinically so I am going to recommend that we try continuing to go up a little higher on the dose.  Patient is still having psychotic symptoms but is also well enough that we have recommended discharge home.  Family however continues to refuse to take her back.  Social work is discussing options.  , MD 06/06/2022, 9:58 AM

## 2022-06-06 NOTE — Plan of Care (Signed)
Pt is calm and cooperative, compliant with medications, denies SI HI AVH, Denies physical symptoms/pain.  Continued monitoring for safety via q18minute checks.

## 2022-06-06 NOTE — Plan of Care (Signed)
Pt is calm and cooperative, compliant with medications, no physical or pain complaints, denies SI HI AVH.  Continued monitoring via q 15 min checks for safety.

## 2022-06-07 DIAGNOSIS — F251 Schizoaffective disorder, depressive type: Secondary | ICD-10-CM | POA: Diagnosis not present

## 2022-06-07 MED ORDER — QUETIAPINE FUMARATE 200 MG PO TABS
600.0000 mg | ORAL_TABLET | Freq: Every day | ORAL | Status: DC
  Administered 2022-06-07 – 2022-06-18 (×10): 600 mg via ORAL
  Filled 2022-06-07 (×12): qty 3

## 2022-06-07 NOTE — Progress Notes (Addendum)
[facility] MD Progress Note  06/07/2022 8:10 AM [name]  MRN:  [id number] Subjective:  11/19 The patient was seen in the cafeteria eating breakfast this morning.  She offers no new thoughts over the past 24 hours.  "I am here.  I am good," she states within flat affect.  She did sleep well, and appetite is intact.  "I just want to go home.  You cannot hold me here forever.  I have a home."  Denies symptoms of psychosis.  Denies having any thoughts occurrences or coincidences on the unit.  No racing thoughts.  No new medical issues.  11/18 Fixates on wanting to "go home."  "I'm entrapped in here*."  Denies having any concerns.  Mood is "alright."  Sleeping and eating well.  Denies medical issues.  Principal Problem: Schizoaffective disorder, depressive type (HCC) Diagnosis: Principal Problem:   Schizoaffective disorder, depressive type (HCC)  Total Time spent with patient: 25  minutes  Past Psychiatric History: Past history of longstanding schizoaffective disorder or schizophrenia  Past Medical History:  Past Medical History:  Diagnosis Date   Anemia 10/02/2021   Non compliance w medication regimen    Schizophrenia [facility])     Past Surgical History:  Procedure Laterality Date   BIOPSY  09/25/2021   Procedure: BIOPSY;  Surgeon: Rachael Fee, MD;  Location: [facility];  Service: Gastroenterology;;   ESOPHAGOGASTRODUODENOSCOPY (EGD) WITH PROPOFOL [name] 09/25/2021   Procedure: ESOPHAGOGASTRODUODENOSCOPY (EGD) WITH PROPOFOL;  Surgeon: Rachael Fee, MD;  Location: [facility];  Service: Gastroenterology;  Laterality: [name];  With NGT placement   Family History: History reviewed. No pertinent family history. Family Psychiatric  History: See previous Social History:  Social History   Substance and Sexual Activity  Alcohol Use Not Currently   Comment: Refuses to disclose how much     Social History   Substance and Sexual Activity  Drug Use No   Comment: Refuses to answer    Social  History   Socioeconomic History   Marital status: Single    Spouse name: Not on file   Number of children: Not on file   Years of education: Not on file   Highest education level: Not on file  Occupational History   Not on file  Tobacco Use   Smoking status: Every Day    Packs/day: 2.00    Types: Cigarettes   Smokeless tobacco: Never  Substance and Sexual Activity   Alcohol use: Not Currently    Comment: Refuses to disclose how much   Drug use: No    Comment: Refuses to answer   Sexual activity: Not Currently    Comment: refused to answer  Other Topics Concern   Not on file  Social History Narrative   Not on file   Social Determinants of Health   Financial Resource Strain: Not on file  Food Insecurity: No Food Insecurity (04/08/2022)   Hunger Vital Sign    Worried About Running Out of Food in the Last Year: Never true    Ran Out of Food in the Last Year: Never true  Transportation Needs: No Transportation Needs (04/08/2022)   PRAPARE - Administrator, Civil Service (Medical): No    Lack of Transportation (Non-Medical): No  Physical Activity: Not on file  Stress: Not on file  Social Connections: Not on file   Additional Social History:  Sleep: Fair  Appetite:  Fair  Current Medications: Current Facility-Administered Medications  Medication Dose Route Frequency Provider Last Rate Last Admin   acetaminophen (TYLENOL) tablet 650 mg  650 mg Oral Q6H PRN [name], [name], MD       alum & mag hydroxide-simeth (MAALOX/MYLANTA) 200-200-20 MG/5ML suspension 30 mL  30 mL Oral Q4H PRN [name], [name], MD       haloperidol (HALDOL) tablet 5 mg  5 mg Oral BID [name], [name], MD   5 mg at 06/06/22 1652   Or   haloperidol lactate (HALDOL) injection 5 mg  5 mg Intramuscular BID [name], [name], MD       lithium carbonate capsule 600 mg  600 mg Oral QHS [name], [name], MD   600 mg at 06/06/22 2106   LORazepam (ATIVAN) tablet 1 mg  1  mg Oral BID [name], [name], MD   1 mg at 06/06/22 1653   Or   LORazepam (ATIVAN) injection 1 mg  1 mg Intramuscular BID [name], [name], MD       magnesium hydroxide (MILK OF MAGNESIA) suspension 30 mL  30 mL Oral Daily PRN [name], [name], MD       mirtazapine (REMERON) tablet 30 mg  30 mg Oral QHS [name], [name], MD   30 mg at 06/06/22 2106   multivitamin with minerals tablet 1 tablet  1 tablet Oral Daily [name], [name], MD   1 tablet at 06/06/22 0759   QUEtiapine (SEROQUEL) tablet 500 mg  500 mg Oral QHS [name], [name], MD   500 mg at 06/06/22 2106   thiamine (VITAMIN B1) tablet 100 mg  100 mg Oral Daily [name], [name], MD   100 mg at 06/06/22 0759    Lab Results: No results found for this or any previous visit (from the past 48 hour(s)).  Blood Alcohol level:  Lab Results  Component Value Date   ETH <10 03/29/2022   ETH <10 09/12/2021    Metabolic Disorder Labs: Lab Results  Component Value Date   HGBA1C 5.6 10/21/2021   MPG 114 10/21/2021   MPG 111 10/02/2021   Lab Results  Component Value Date   PROLACTIN 13.6 04/16/2015   Lab Results  Component Value Date   CHOL 189 10/21/2021   TRIG 52 10/21/2021   HDL 50 10/21/2021   CHOLHDL 3.8 10/21/2021   VLDL 10 10/21/2021   LDLCALC 129 (H) 10/21/2021   LDLCALC 93 10/01/2020    Physical Findings: AIMS: Facial and Oral Movements Muscles of Facial Expression: None, normal Lips and Perioral Area: None, normal Jaw: None, normal Tongue: None, normal,Extremity Movements Upper (arms, wrists, hands, fingers): None, normal Lower (legs, knees, ankles, toes): None, normal, Trunk Movements Neck, shoulders, hips: None, normal, Overall Severity Severity of abnormal movements (highest score from questions above): None, normal Incapacitation due to abnormal movements: None, normal Patient's awareness of abnormal movements (rate only patient's report): No Awareness, Dental Status Current problems with teeth and/or dentures?:  No Does patient usually wear dentures?: No  CIWA:    COWS:     Musculoskeletal: Strength & Muscle Tone: within normal limits Gait & Station: normal Patient leans: [name]  Psychiatric Specialty Exam:  Presentation  General Appearance:  Disheveled  Eye Contact: Fair  Speech: Soft  Speech Volume: lwo Handedness: Right   Mood and Affect  Mood: I'm good  Affect: Restricted   Thought Process  Thought Processes: Disorganized; Irrevelant  Descriptions of Associations:Loose  Orientation:Partial  Thought Content:Illogical; Rumination; Scattered; Tangential; Delusions  History of Schizophrenia/Schizoaffective disorder:Yes  Duration of Psychotic Symptoms:Greater than six months  Yes: Paranoia. Sensorium  Memory: Immediate Fair; Recent Fair; Remote Fair  Judgment: Impaired  Insight: Poor; Lacking   Executive Functions  Concentration: Poor  Attention Span: Poor  Recall: Poor  Fund of Knowledge: Poor  Language: Poor   Psychomotor Activity  Psychomotor Activity:No data recorded  Assets  Assets: Physical Health; Resilience; Housing; Social Support   Sleep  Good   Physical Exam: Physical Exam Vitals and nursing note reviewed.  Constitutional:      Appearance: Normal appearance.  HENT:     Head: Normocephalic and atraumatic.     Mouth/Throat:     Pharynx: Oropharynx is clear.  Eyes:     Pupils: Pupils are equal, round, and reactive to light.  Cardiovascular:     Rate and Rhythm: Normal rate and regular rhythm.  Pulmonary:     Effort: Pulmonary effort is normal.     Breath sounds: Normal breath sounds.  Abdominal:     General: Abdomen is flat.     Palpations: Abdomen is soft.  Musculoskeletal:        General: Normal range of motion.  Skin:    General: Skin is warm and dry.  Neurological:     General: No focal deficit present.     Mental Status: She is alert. Mental status is at baseline.  Psychiatric:        Attention and  Perception: She is inattentive.        Mood and Affect: Mood normal. Affect is blunt.        Behavior: Behavior is withdrawn.        Thought Content: Thought content is paranoid.        Cognition and Memory: Cognition is impaired.    Review of Systems  Constitutional: Negative.   HENT: Negative.    Eyes: Negative.   Respiratory: Negative.    Cardiovascular: Negative.   Gastrointestinal: Negative.   Musculoskeletal: Negative.   Skin: Negative.   Neurological: Negative.   Psychiatric/Behavioral:  The patient is nervous/anxious.    Blood pressure 113/65, pulse (!) 58, temperature 98.2 F (36.8 C), temperature source Oral, resp. rate 18, height 5\' 4"  (1.626 m), weight 40.4 kg, SpO2 100 %. Body mass index is 15.28 kg/m.   Treatment Plan Summary: Medication management and Plan patient is now on Seroquel and we have been titrating the dose up.  She seems to be tolerating it adequately.  No real change clinically so I am going to recommend that we try continuing to go up a little higher on the dose.  Patient is still having psychotic symptoms but is also well enough that we have recommended discharge home.  Family however continues to refuse to take her back.  Social work is discussing options.  11/18 No changes  11/19 Increase Seroquel to 600mg  PO q HS.  12/19, MD 06/07/2022, 8:10 AM

## 2022-06-07 NOTE — Plan of Care (Signed)
Pt denies anxiety/depression at this time. Pt denies SI/HI/AVH or pain at this time. Pt is calm and cooperative. Pt is medication compliant. Pt provided with support and encouragement. Pt monitored q15 minutes for safety per unit policy. Plan of care ongoing.   Problem: Education: Goal: Knowledge of General Education information will improve Description: Including pain rating scale, medication(s)/side effects and non-pharmacologic comfort measures Outcome: Progressing   Problem: Nutrition: Goal: Adequate nutrition will be maintained Outcome: Progressing   

## 2022-06-07 NOTE — Group Note (Signed)
LCSW Group Therapy Note   Group Date: 06/07/2022 Start Time: 1300 End Time: 1400   Type of Therapy and Topic:  Group Therapy: Challenging Core Beliefs  Participation Level:  Did Not Attend  Description of Group:  Patients were educated about core beliefs and asked to identify one harmful core belief that they have. Patients were asked to explore from where those beliefs originate. Patients were asked to discuss how those beliefs make them feel and the resulting behaviors of those beliefs. They were then be asked if those beliefs are true and, if so, what evidence they have to support them. Lastly, group members were challenged to replace those negative core beliefs with helpful beliefs.   Therapeutic Goals:   1. Patient will identify harmful core beliefs and explore the origins of such beliefs. 2. Patient will identify feelings and behaviors that result from those core beliefs. 3. Patient will discuss whether such beliefs are true. 4.  Patient will replace harmful core beliefs with helpful ones.  Summary of Patient Progress:   Patient declined to attend group, despite being invited by this clinician.   Therapeutic Modalities: Cognitive Behavioral Therapy; Solution-Focused Therapy   Harden Mo, LCSWA 06/07/2022  2:23 PM

## 2022-06-07 NOTE — Plan of Care (Signed)
Met with PT in medication room. Pt denies SI / HI / AVH. Pt is pleasant, and appropriate. Adherent with scheduled medications. Staff will continue to monitor q15 for safety.    Problem: Education: Goal: Knowledge of General Education information will improve Description: Including pain rating scale, medication(s)/side effects and non-pharmacologic comfort measures Outcome: Progressing   Problem: Coping: Goal: Level of anxiety will decrease Outcome: Progressing

## 2022-06-08 DIAGNOSIS — F251 Schizoaffective disorder, depressive type: Secondary | ICD-10-CM | POA: Diagnosis not present

## 2022-06-08 NOTE — Plan of Care (Signed)
Patient stays in her room in darkness except for meals. Patient states " I am good " and denies SI,HI and AVH. Minimal interactions with staff & peers. Appetite and energy level good. Support and encouragement given.

## 2022-06-08 NOTE — Progress Notes (Signed)
[facility] MD Progress Note  06/08/2022 1:37 PM [name]  MRN:  [id number] Subjective: Patient seen and chart reviewed.  No change in presentation.  Often stays in her room sitting in a chair in the back in the dark.  When spoken with as she has little to say except that she wants to be discharged.  Cannot engage in any conversation about her family's requests or needs or opinions.  Gets irritated when we try.  No clear medical problems.  Encourage patient to be out of her room.  I am going to review labs and see if we can change anything further. Principal Problem: Schizoaffective disorder, depressive type (HCC) Diagnosis: Principal Problem:   Schizoaffective disorder, depressive type (HCC)  Total Time spent with patient: 30 minutes  Past Psychiatric History: Past history of longstanding schizophrenia  Past Medical History:  Past Medical History:  Diagnosis Date   Anemia 10/02/2021   Non compliance w medication regimen    Schizophrenia [facility])     Past Surgical History:  Procedure Laterality Date   BIOPSY  09/25/2021   Procedure: BIOPSY;  Surgeon: Rachael Fee, MD;  Location: [facility];  Service: Gastroenterology;;   ESOPHAGOGASTRODUODENOSCOPY (EGD) WITH PROPOFOL [name] 09/25/2021   Procedure: ESOPHAGOGASTRODUODENOSCOPY (EGD) WITH PROPOFOL;  Surgeon: Rachael Fee, MD;  Location: [facility];  Service: Gastroenterology;  Laterality: [name];  With NGT placement   Family History: History reviewed. No pertinent family history. Family Psychiatric  History: See previous Social History:  Social History   Substance and Sexual Activity  Alcohol Use Not Currently   Comment: Refuses to disclose how much     Social History   Substance and Sexual Activity  Drug Use No   Comment: Refuses to answer    Social History   Socioeconomic History   Marital status: Single    Spouse name: Not on file   Number of children: Not on file   Years of education: Not on file   Highest education level:  Not on file  Occupational History   Not on file  Tobacco Use   Smoking status: Every Day    Packs/day: 2.00    Types: Cigarettes   Smokeless tobacco: Never  Substance and Sexual Activity   Alcohol use: Not Currently    Comment: Refuses to disclose how much   Drug use: No    Comment: Refuses to answer   Sexual activity: Not Currently    Comment: refused to answer  Other Topics Concern   Not on file  Social History Narrative   Not on file   Social Determinants of Health   Financial Resource Strain: Not on file  Food Insecurity: No Food Insecurity (04/08/2022)   Hunger Vital Sign    Worried About Running Out of Food in the Last Year: Never true    Ran Out of Food in the Last Year: Never true  Transportation Needs: No Transportation Needs (04/08/2022)   PRAPARE - Administrator, Civil Service (Medical): No    Lack of Transportation (Non-Medical): No  Physical Activity: Not on file  Stress: Not on file  Social Connections: Not on file   Additional Social History:                         Sleep: Fair  Appetite:  Fair  Current Medications: Current Facility-Administered Medications  Medication Dose Route Frequency Provider Last Rate Last Admin   acetaminophen (TYLENOL) tablet 650 mg  650 mg  Oral Q6H PRN Oral Remache, [name], MD       alum & mag hydroxide-simeth (MAALOX/MYLANTA) 200-200-20 MG/5ML suspension 30 mL  30 mL Oral Q4H PRN Peityn Payton T, MD       haloperidol (HALDOL) tablet 5 mg  5 mg Oral BID [place]ak Rollyson T, MD   5 mg at 06/08/22 9574   Or   haloperidol lactate (HALDOL) injection 5 mg  5 mg Intramuscular BID Meriam Chojnowski T, MD       lithium carbonate capsule 600 mg  600 mg Oral QHS Nikalas Bramel T, MD   600 mg at 06/07/22 2114   LORazepam (ATIVAN) tablet 1 mg  1 mg Oral BID [name] Odriscoll T, MD   1 mg at 06/08/22 7340   Or   LORazepam (ATIVAN) injection 1 mg  1 mg Intramuscular BID Keshawn Sundberg T, MD       magnesium hydroxide (MILK OF MAGNESIA)  suspension 30 mL  30 mL Oral Daily PRN Leni Pankonin T, MD       mirtazapine (REMERON) tablet 30 mg  30 mg Oral QHS Luberta Grabinski T, MD   30 mg at 06/07/22 2114   multivitamin with minerals tablet 1 tablet  1 tablet Oral Daily Marchell Froman, [name], MD   1 tablet at 06/08/22 0819   QUEtiapine (SEROQUEL) tablet 600 mg  600 mg Oral QHS Reggie Pile, MD   600 mg at 06/07/22 2114   thiamine (VITAMIN B1) tablet 100 mg  100 mg Oral Daily Davonne Baby, [name], MD   100 mg at 06/08/22 3709    Lab Results: No results found for this or any previous visit (from the past 48 hour(s)).  Blood Alcohol level:  Lab Results  Component Value Date   ETH <10 03/29/2022   ETH <10 09/12/2021    Metabolic Disorder Labs: Lab Results  Component Value Date   HGBA1C 5.6 10/21/2021   MPG 114 10/21/2021   MPG 111 10/02/2021   Lab Results  Component Value Date   PROLACTIN 13.6 04/16/2015   Lab Results  Component Value Date   CHOL 189 10/21/2021   TRIG 52 10/21/2021   HDL 50 10/21/2021   CHOLHDL 3.8 10/21/2021   VLDL 10 10/21/2021   LDLCALC 129 (H) 10/21/2021   LDLCALC 93 10/01/2020    Physical Findings: AIMS: Facial and Oral Movements Muscles of Facial Expression: None, normal Lips and Perioral Area: None, normal Jaw: None, normal Tongue: None, normal,Extremity Movements Upper (arms, wrists, hands, fingers): None, normal Lower (legs, knees, ankles, toes): None, normal, Trunk Movements Neck, shoulders, hips: None, normal, Overall Severity Severity of abnormal movements (highest score from questions above): None, normal Incapacitation due to abnormal movements: None, normal Patient's awareness of abnormal movements (rate only patient's report): No Awareness, Dental Status Current problems with teeth and/or dentures?: No Does patient usually wear dentures?: No  CIWA:    COWS:     Musculoskeletal: Strength & Muscle Tone: within normal limits Gait & Station: normal Patient leans: [name]  Psychiatric  Specialty Exam:  Presentation  General Appearance:  Disheveled  Eye Contact: Fair  Speech: Pressured  Speech Volume: Increased  Handedness: Right   Mood and Affect  Mood: Irritable; Labile; Angry  Affect: Blunt; Labile   Thought Process  Thought Processes: Disorganized; Irrevelant  Descriptions of Associations:Loose  Orientation:Partial  Thought Content:Illogical; Rumination; Scattered; Tangential; Delusions  History of Schizophrenia/Schizoaffective disorder:Yes  Duration of Psychotic Symptoms:Greater than six months  Hallucinations:No data recorded Ideas of Reference:Percusatory; Paranoia  Suicidal Thoughts:No data recorded Homicidal Thoughts:No data recorded  Sensorium  Memory: Immediate Fair; Recent Fair; Remote Fair  Judgment: Impaired  Insight: Poor; Lacking   Executive Functions  Concentration: Poor  Attention Span: Poor  Recall: Poor  Fund of Knowledge: Poor  Language: Poor   Psychomotor Activity  Psychomotor Activity:No data recorded  Assets  Assets: Physical Health; Resilience; Housing; Social Support   Sleep  Sleep:No data recorded   Physical Exam: Physical Exam Vitals and nursing note reviewed.  Constitutional:      Appearance: Normal appearance.  HENT:     Head: Normocephalic and atraumatic.     Mouth/Throat:     Pharynx: Oropharynx is clear.  Eyes:     Pupils: Pupils are equal, round, and reactive to light.  Cardiovascular:     Rate and Rhythm: Normal rate and regular rhythm.  Pulmonary:     Effort: Pulmonary effort is normal.     Breath sounds: Normal breath sounds.  Abdominal:     General: Abdomen is flat.     Palpations: Abdomen is soft.  Musculoskeletal:        General: Normal range of motion.  Skin:    General: Skin is warm and dry.  Neurological:     General: No focal deficit present.     Mental Status: She is alert. Mental status is at baseline.  Psychiatric:        Attention and  Perception: She is inattentive.        Mood and Affect: Mood normal. Affect is blunt.        Speech: She is noncommunicative.        Behavior: Behavior is cooperative.        Thought Content: Thought content is delusional.        Cognition and Memory: Cognition is impaired.    Review of Systems  Constitutional: Negative.   HENT: Negative.    Eyes: Negative.   Respiratory: Negative.    Cardiovascular: Negative.   Gastrointestinal: Negative.   Musculoskeletal: Negative.   Skin: Negative.   Neurological: Negative.   Psychiatric/Behavioral: Negative.     Blood pressure 108/67, pulse 65, temperature 98.3 F (36.8 C), temperature source Oral, resp. rate 18, height 5\' 4"  (1.626 m), weight 40.4 kg, SpO2 99 %. Body mass index is 15.28 kg/m.   Treatment Plan Summary: Medication management and Plan patient did not get better with Haldol or olanzapine seems to so far not be showing much response to Seroquel either.  Try increasing the dose a little bit.  She is tolerating it without any dizziness or complaints.  , MD 06/08/2022, 1:37 PM

## 2022-06-08 NOTE — Progress Notes (Signed)
Recreation Therapy Notes  Date: 06/08/2022  Time: 10:40 am     Location: Craft room   Behavioral response: [name]   Intervention Topic: Stress Management    Discussion/Intervention: Patient refused to attend group.   Clinical Observations/Feedback:  Patient refused to attend group.    Welborn Keena LRT/CTRS        [place]set Joshi 06/08/2022 12:33 PM

## 2022-06-08 NOTE — Group Note (Signed)

## 2022-06-08 NOTE — [place] FL2 (Signed)
[place] MEDICAID FL2 LEVEL OF CARE SCREENING TOOL     IDENTIFICATION  Patient Name: [name] Birthdate: 09-12-1960 Sex: female Admission Date (Current Location): 04/08/2022  [facility] and [place] Number:  Chiropodist and Address:  [facility], [address] [place], [place] [address]      Provider Number: [id number]  Attending Physician Name and Address:  [name], [name], MD  Relative Name and Phone Number:       Current Level of Care: Hospital Recommended Level of Care: Assisted Living Facility, [facility], Other (Comment) (Group Home) Prior Approval Number:    Date Approved/Denied:   PASRR Number:    Discharge Plan: Other (Comment) (Group Home, Assisted Living, Family Care Home)    Current Diagnoses: Patient Active Problem List   Diagnosis Date Noted   Schizoaffective disorder, depressive type (HCC) 04/08/2022   Ventricular tachycardia (HCC) 10/13/2021   Sinus bradycardia 10/03/2021   Anemia of chronic disease 10/02/2021   Hypomagnesemia 09/22/2021   Hypokalemia 09/22/2021   Hypophosphatemia 09/21/2021   Hyponatremia 09/20/2021   Metabolic acidosis 09/16/2021   Protein-calorie malnutrition, severe (HCC) 03/27/2020   Hypotension 03/26/2020   Anorexia 03/26/2020   Hypokalemia, hypomagnesemia and hypophosphatemia 03/26/2020   Hypocalcemia 03/26/2020   Fall at home, initial encounter 03/26/2020   Recurrent hypoglycemia and severe malnutrition 03/14/2020   Paranoid schizophrenia (HCC) 03/01/2020   Noncompliance with diet and medication regimen    Psychoses (HCC)    Tobacco abuse 03/12/2014   Hypokalemic alkalosis 11/11/2011   Medically noncompliant 11/11/2011    Orientation RESPIRATION BLADDER Height & Weight     Self, Situation, Place  Normal Continent Weight: 89 lb (40.4 kg) Height:  5\' 4"  (162.6 cm)  BEHAVIORAL SYMPTOMS/MOOD NEUROLOGICAL BOWEL NUTRITION STATUS   (NA)  (NA) Continent Diet (Regular)  AMBULATORY  STATUS COMMUNICATION OF NEEDS Skin   Independent Verbally Normal                       Personal Care Assistance Level of Assistance   (NA)           Functional Limitations Info   (NA)          SPECIAL CARE FACTORS FREQUENCY   (NA)                    Contractures Contractures Info: Not present    Additional Factors Info  Code Status, Allergies, Psychotropic Code Status Info: Full Allergies Info: Aspirin, Ziprasidone Hc Psychotropic Info: Haldol, Remeron, Lithium Carbonate, Ativan, Seroquel         Current Medications (06/08/2022):  This is the current hospital active medication list Current Facility-Administered Medications  Medication Dose Route Frequency Provider Last Rate Last Admin   acetaminophen (TYLENOL) tablet 650 mg  650 mg Oral Q6H PRN [name], [name], MD       alum & mag hydroxide-simeth (MAALOX/MYLANTA) 200-200-20 MG/5ML suspension 30 mL  30 mL Oral Q4H PRN [name], [name], MD       haloperidol (HALDOL) tablet 5 mg  5 mg Oral BID [name], [name], MD   5 mg at 06/08/22 06/10/22   Or   haloperidol lactate (HALDOL) injection 5 mg  5 mg Intramuscular BID [name], [name], MD       lithium carbonate capsule 600 mg  600 mg Oral QHS [name], [name], MD   600 mg at 06/07/22 2114   LORazepam (ATIVAN) tablet 1 mg  1 mg Oral  BID [name], [name], MD   1 mg at 06/08/22 1749   Or   LORazepam (ATIVAN) injection 1 mg  1 mg Intramuscular BID [name], [name], MD       magnesium hydroxide (MILK OF MAGNESIA) suspension 30 mL  30 mL Oral Daily PRN [name], [name], MD       mirtazapine (REMERON) tablet 30 mg  30 mg Oral QHS [name], [name], MD   30 mg at 06/07/22 2114   multivitamin with minerals tablet 1 tablet  1 tablet Oral Daily [name], [name], MD   1 tablet at 06/08/22 0819   QUEtiapine (SEROQUEL) tablet 600 mg  600 mg Oral QHS Reggie Pile, MD   600 mg at 06/07/22 2114   thiamine (VITAMIN B1) tablet 100 mg  100 mg Oral Daily [name], [name], MD   100 mg at 06/08/22  [id number]     Discharge Medications: Please see discharge summary for a list of discharge medications.  Relevant Imaging Results:  Relevant Lab Results:   Additional Information    Harden Mo, LCSW

## 2022-06-08 NOTE — BHH Counselor (Addendum)
CSW contacted [facility] and spoke with Genevie Cheshire about possibility of patient becoming a resident at his facility.  Genevie Cheshire explained that patient would need to qualify for Medicaid.  CSW explained that Medicaid had been explored and determined that the patient made $243 over.  Genevie Cheshire reported that the patient can pay out of pocket.  He reports that there is a current special of $2300/month, once the special is over the fee will be $2920/month.  That is the rate for a semi-private room, for a private room the cost $3760.  He requested that the following information be sent to [email or web address]  Fl2, H&P, progress notes, medication list and contact information.    CSW called and spoke with the patient's sister.  Sister reports that she is in the process of finding bills to take to social services to assist with Special Assistance funding.  She reports that she hopes to go to DSS by the end of week.  She reports that she did not contact [facility] and CSW relayed the above information.  She reports that she can not afford the fees of [facility] but provided verbal permission for CSW to send the requested information.  Penni Homans, MSW, LCSW 06/08/2022 2:05 PM   ADDENDUM CSW has sent the requested information.  CSW to follow up.  Penni Homans, MSW, LCSW 06/08/2022 3:27 PM

## 2022-06-09 DIAGNOSIS — F251 Schizoaffective disorder, depressive type: Secondary | ICD-10-CM | POA: Diagnosis not present

## 2022-06-09 NOTE — Progress Notes (Signed)
Recreation Therapy Notes    Date: 06/09/2022   Time: 10:50 am      Location: Craft room    Behavioral response: [name]   Intervention Topic: Values  Discussion/Intervention: Patient refused to attend group.    Clinical Observations/Feedback:  Patient refused to attend group.    Kristelle Cavallaro LRT/CTRS        Yazmina Pareja 06/09/2022 11:27 AM

## 2022-06-09 NOTE — Plan of Care (Signed)
  Problem: Education: Goal: Knowledge of General Education information will improve Description: Including pain rating scale, medication(s)/side effects and non-pharmacologic comfort measures Outcome: Progressing   Problem: Health Behavior/Discharge Planning: Goal: Ability to manage health-related needs will improve Outcome: Progressing   Problem: Clinical Measurements: Goal: Ability to maintain clinical measurements within normal limits will improve Outcome: Progressing Goal: Will remain free from infection Outcome: Progressing Goal: Diagnostic test results will improve Outcome: Progressing Goal: Respiratory complications will improve Outcome: Progressing Goal: Cardiovascular complication will be avoided Outcome: Progressing   Problem: Safety: Goal: Periods of time without injury will increase Outcome: Progressing   Problem: Physical Regulation: Goal: Ability to maintain clinical measurements within normal limits will improve Outcome: Progressing   Problem: Health Behavior/Discharge Planning: Goal: Identification of resources available to assist in meeting health care needs will improve Outcome: Progressing Goal: Compliance with treatment plan for underlying cause of condition will improve Outcome: Progressing   Problem: Coping: Goal: Ability to verbalize frustrations and anger appropriately will improve Outcome: Progressing Goal: Ability to demonstrate self-control will improve Outcome: Progressing   Problem: Activity: Goal: Interest or engagement in activities will improve Outcome: Progressing Goal: Sleeping patterns will improve Outcome: Progressing   

## 2022-06-09 NOTE — BH IP Treatment Plan (Signed)
Interdisciplinary Treatment and Diagnostic Plan Update  06/09/2022 Time of Session: 08:30 [name] MRN: [id number]  Principal Diagnosis: Schizoaffective disorder, depressive type [facility])  Secondary Diagnoses: Principal Problem:   Schizoaffective disorder, depressive type (HCC)   Current Medications:  Current Facility-Administered Medications  Medication Dose Route Frequency Provider Last Rate Last Admin   acetaminophen (TYLENOL) tablet 650 mg  650 mg Oral Q6H PRN [name], [name], MD       alum & mag hydroxide-simeth (MAALOX/MYLANTA) 200-200-20 MG/5ML suspension 30 mL  30 mL Oral Q4H PRN [name], [name], MD       haloperidol (HALDOL) tablet 5 mg  5 mg Oral BID [name], [name], MD   5 mg at 06/09/22 2035   Or   haloperidol lactate (HALDOL) injection 5 mg  5 mg Intramuscular BID [name], [name], MD       lithium carbonate capsule 600 mg  600 mg Oral QHS [name], [name], MD   600 mg at 06/08/22 2110   LORazepam (ATIVAN) tablet 1 mg  1 mg Oral BID [name], [name], MD   1 mg at 06/09/22 0820   Or   LORazepam (ATIVAN) injection 1 mg  1 mg Intramuscular BID [name], [name], MD       magnesium hydroxide (MILK OF MAGNESIA) suspension 30 mL  30 mL Oral Daily PRN [name], [name], MD       mirtazapine (REMERON) tablet 30 mg  30 mg Oral QHS [name], [name], MD   30 mg at 06/08/22 2110   multivitamin with minerals tablet 1 tablet  1 tablet Oral Daily [name], [name], MD   1 tablet at 06/09/22 0820   QUEtiapine (SEROQUEL) tablet 600 mg  600 mg Oral QHS Reggie Pile, MD   600 mg at 06/08/22 2110   thiamine (VITAMIN B1) tablet 100 mg  100 mg Oral Daily [name], [name], MD   100 mg at 06/09/22 0820   PTA Medications: Medications Prior to Admission  Medication Sig Dispense Refill Last Dose   clonazePAM (KLONOPIN) 0.5 MG tablet Take 0.5 tablets (0.25 mg total) by mouth 2 (two) times daily. (Patient not taking: Reported on 03/30/2022) 60 tablet 0    haloperidol (HALDOL) 5 MG tablet Take 1 tablet (5 mg  total) by mouth 2 (two) times daily. (Patient not taking: Reported on 03/30/2022) 60 tablet 3    midodrine (PROAMATINE) 2.5 MG tablet Take 1 tablet (2.5 mg total) by mouth 3 (three) times daily with meals. (Patient not taking: Reported on 03/30/2022)  0    mirtazapine (REMERON) 15 MG tablet Take 0.5 tablets (7.5 mg total) by mouth at bedtime. (Patient not taking: Reported on 03/30/2022) 30 tablet 2    Multiple Vitamin (MULTIVITAMIN WITH MINERALS) TABS tablet Take 1 tablet by mouth daily. (Patient not taking: Reported on 03/30/2022)      OLANZapine (ZYPREXA) 5 MG tablet Take 1 tablet (5 mg total) by mouth at bedtime. (Patient not taking: Reported on 03/30/2022) 30 tablet 2    thiamine 100 MG tablet Take 1 tablet (100 mg total) by mouth daily. (Patient not taking: Reported on 03/30/2022)       Patient Stressors: Marital or family conflict    Patient Strengths: Supportive family/friends   Treatment Modalities: Medication Management, Group therapy, Case management,  1 to 1 session with clinician, Psychoeducation, Recreational therapy.   Physician Treatment Plan for Primary Diagnosis: Schizoaffective disorder, depressive type (HCC) Long Term Goal(s): Improvement in symptoms so as ready for discharge  Short Term Goals: Compliance with prescribed medications will improve Ability to verbalize feelings will improve Ability to demonstrate self-control will improve Ability to identify and develop effective coping behaviors will improve  Medication Management: Evaluate patient's response, side effects, and tolerance of medication regimen.  Therapeutic Interventions: 1 to 1 sessions, Unit Group sessions and Medication administration.  Evaluation of Outcomes: Not Progressing  Physician Treatment Plan for Secondary Diagnosis: Principal Problem:   Schizoaffective disorder, depressive type (HCC)  Long Term Goal(s): Improvement in symptoms so as ready for discharge   Short Term Goals: Compliance with  prescribed medications will improve Ability to verbalize feelings will improve Ability to demonstrate self-control will improve Ability to identify and develop effective coping behaviors will improve     Medication Management: Evaluate patient's response, side effects, and tolerance of medication regimen.  Therapeutic Interventions: 1 to 1 sessions, Unit Group sessions and Medication administration.  Evaluation of Outcomes: Not Progressing   RN Treatment Plan for Primary Diagnosis: Schizoaffective disorder, depressive type (HCC) Long Term Goal(s): Knowledge of disease and therapeutic regimen to maintain health will improve  Short Term Goals: Ability to remain free from injury will improve, Ability to verbalize frustration and anger appropriately will improve, Ability to demonstrate self-control, Ability to participate in decision making will improve, Ability to verbalize feelings will improve, Ability to disclose and discuss suicidal ideas, Ability to identify and develop effective coping behaviors will improve, and Compliance with prescribed medications will improve  Medication Management: RN will administer medications as ordered by provider, will assess and evaluate patient's response and provide education to patient for prescribed medication. RN will report any adverse and/or side effects to prescribing provider.  Therapeutic Interventions: 1 on 1 counseling sessions, Psychoeducation, Medication administration, Evaluate responses to treatment, Monitor vital signs and CBGs as ordered, Perform/monitor CIWA, COWS, AIMS and Fall Risk screenings as ordered, Perform wound care treatments as ordered.  Evaluation of Outcomes: Not Progressing   LCSW Treatment Plan for Primary Diagnosis: Schizoaffective disorder, depressive type (HCC) Long Term Goal(s): Safe transition to appropriate next level of care at discharge, Engage patient in therapeutic group addressing interpersonal concerns.  Short  Term Goals: Engage patient in aftercare planning with referrals and resources, Increase social support, Increase ability to appropriately verbalize feelings, Increase emotional regulation, Facilitate acceptance of mental health diagnosis and concerns, Facilitate patient progression through stages of change regarding substance use diagnoses and concerns, Identify triggers associated with mental health/substance abuse issues, and Increase skills for wellness and recovery  Therapeutic Interventions: Assess for all discharge needs, 1 to 1 time with Social worker, Explore available resources and support systems, Assess for adequacy in community support network, Educate family and significant other(s) on suicide prevention, Complete Psychosocial Assessment, Interpersonal group therapy.  Evaluation of Outcomes: Not Progressing   Progress in Treatment: Attending groups: No. Participating in groups: No. Taking medication as prescribed: Yes. Toleration medication: Yes. Family/Significant other contact made: Yes, individual(s) contacted:  sister/guardian, [name] Hubers-Lennon, [phone]. Patient understands diagnosis: Yes. Discussing patient identified problems/goals with staff: Yes. Medical problems stabilized or resolved: Yes. Denies suicidal/homicidal ideation: Yes. Issues/concerns per patient self-inventory: No. Other: none.  New problem(s) identified: No, Describe:  none identified. Update 04/15/22: No changes at this time. Update 04/20/22: No changes at this time. Update 04/25/22: No changes at this time. Update 05/01/22: No changes at this time. 05/05/22 Update: No changes at this time. 05/10/22 Update: No changes at this time. 05/15/22 Update: No changes at this time. 05/21/22 Update: No changes at this time. 05/25/22  update: No changes at this time. 05/30/22 Update: No changes at this time. 06/04/22 Update: none. 06/09/22 Update: No changes at this time.   New Short Term/Long Term Goal(s):  elimination of symptoms of psychosis, medication management for mood stabilization; elimination of SI thoughts; development of comprehensive mental wellness plan. Update 04/15/22: No changes at this time. Update 04/20/22: No changes at this time. Update 04/25/22: No changes at this time. Update 05/01/22: No changes at this time. 05/05/22 Update: No changes at this time. 05/10/22 Update: No changes at this time. 05/15/22 Update: No changes at this time. 05/21/22 Update: No changes at this time. 05/25/22 update: No changes at this time. 05/30/22 Update: No changes at this time. 06/04/22 Update: Patient to work towards detox, elimination of symptoms of psychosis, medication management for mood stabilization; elimination of SI thoughts; development of comprehensive mental wellness/sobriety plan. 06/09/22 Update: No changes at this time.   Patient Goals:  "Nothing I want to work on. I want to be discharged." Update 04/15/22: No changes at this time. Update 04/20/22: No changes at this time. Update 04/25/22: No changes at this time. Update 05/01/22: No changes at this time. 05/05/22 Update: No changes at this time. 05/10/22 Update: No changes at this time. 05/15/22 Update: No changes at this time. 05/21/22 Update: No changes at this time. 05/25/22 update: No changes at this time. 05/30/22 Update: No changes at this time. 06/04/22 Update:  No additional goals identified at this time. Patient to continue to work towards original goals identified in initial treatment team meeting. CSW will remain available to patient should they voice additional treatment goals. 06/09/22 Update: No changes at this time.    Discharge Plan or Barriers: CSW will assist pt with development of an appropriate aftercare/discharge plan. Update 04/15/22: Pt has been unwilling to engage with the treatment team around discharge. Update 04/20/22: No changes at this time.   Update 04/25/22: Patient will be returning to live with her sister. Pt will receive  outpatient services through [facility]. Update 05/01/22: No changes at this time. 05/05/22 Update: No changes at this time. 05/10/22 Update: will return to her [facility] team. 05/15/22 Update: No changes at this time. 05/21/22 Update: No changes at this time. 05/25/22 update: No changes at this time. 05/30/22 Update: No changes at this time. 06/04/22 Update: No psychosocial barriers identified at this time, patient to return to place of residence when appropriate for discharge. 06/09/22 Update: No changes at this time.    Reason for Continuation of Hospitalization: Aggression Medication stabilization   Estimated Length of Stay: 1-7 days Update 04/15/22: No changes at this time. Update 04/20/22: TBD Update 04/25/22: TBD Update 05/01/22: No changes at this time. 05/05/22 Update: No changes at this time. 05/10/22 Update: No changes at this time. 05/15/22 Update: No changes at this time. 05/21/22 Update: No changes at this time. 05/25/22 update: No changes at this time. 05/30/22 Update: No changes at this time. 06/04/22 Update: No changes at this time. 06/09/22 Update: No changes at this time.  Last 3 [place] Suicide Severity Risk Score: Flowsheet Row Admission (Current) from 04/08/2022 in [facility] INPATIENT BEHAVIORAL MEDICINE Admission (Discharged) from 10/17/2021 in [facility] BEHAVIORAL MEDICINE Admission (Discharged) from 10/02/2021 in [facility] REGIONAL CARDIAC MED PCU  C-SSRS RISK CATEGORY No Risk No Risk No Risk       Last PHQ 2/9 Scores:     No data to display          Scribe for Treatment Team: Glenis Smoker, [place] 06/09/2022  8:48 AM

## 2022-06-09 NOTE — Progress Notes (Signed)
[facility] MD Progress Note  06/09/2022 11:43 AM [name]  MRN:  [id number] Subjective: Follow-up for this 61 year old woman with schizoaffective disorder.  Patient remains isolated.  No real change from all the other previous days.  Mostly stays in bed.  Does not engage in conversation.  When we tried to talk about discharge planning gets irritable and angry insisting on her paranoia about her family. Principal Problem: Schizoaffective disorder, depressive type (HCC) Diagnosis: Principal Problem:   Schizoaffective disorder, depressive type (HCC)  Total Time spent with patient: 30 minutes  Past Psychiatric History: Past history of schizophrenia  Past Medical History:  Past Medical History:  Diagnosis Date   Anemia 10/02/2021   Non compliance w medication regimen    Schizophrenia [facility])     Past Surgical History:  Procedure Laterality Date   BIOPSY  09/25/2021   Procedure: BIOPSY;  Surgeon: Rachael Fee, MD;  Location: [facility];  Service: Gastroenterology;;   ESOPHAGOGASTRODUODENOSCOPY (EGD) WITH PROPOFOL [name] 09/25/2021   Procedure: ESOPHAGOGASTRODUODENOSCOPY (EGD) WITH PROPOFOL;  Surgeon: Rachael Fee, MD;  Location: [facility];  Service: Gastroenterology;  Laterality: [name];  With NGT placement   Family History: History reviewed. No pertinent family history. Family Psychiatric  History: See previous Social History:  Social History   Substance and Sexual Activity  Alcohol Use Not Currently   Comment: Refuses to disclose how much     Social History   Substance and Sexual Activity  Drug Use No   Comment: Refuses to answer    Social History   Socioeconomic History   Marital status: Single    Spouse name: Not on file   Number of children: Not on file   Years of education: Not on file   Highest education level: Not on file  Occupational History   Not on file  Tobacco Use   Smoking status: Every Day    Packs/day: 2.00    Types: Cigarettes   Smokeless tobacco: Never   Substance and Sexual Activity   Alcohol use: Not Currently    Comment: Refuses to disclose how much   Drug use: No    Comment: Refuses to answer   Sexual activity: Not Currently    Comment: refused to answer  Other Topics Concern   Not on file  Social History Narrative   Not on file   Social Determinants of Health   Financial Resource Strain: Not on file  Food Insecurity: No Food Insecurity (04/08/2022)   Hunger Vital Sign    Worried About Running Out of Food in the Last Year: Never true    Ran Out of Food in the Last Year: Never true  Transportation Needs: No Transportation Needs (04/08/2022)   PRAPARE - Administrator, Civil Service (Medical): No    Lack of Transportation (Non-Medical): No  Physical Activity: Not on file  Stress: Not on file  Social Connections: Not on file   Additional Social History:                         Sleep: Fair  Appetite:  Fair  Current Medications: Current Facility-Administered Medications  Medication Dose Route Frequency Provider Last Rate Last Admin   acetaminophen (TYLENOL) tablet 650 mg  650 mg Oral Q6H PRN Samual Beals T, MD       alum & mag hydroxide-simeth (MAALOX/MYLANTA) 200-200-20 MG/5ML suspension 30 mL  30 mL Oral Q4H PRN Teliyah Royal, [name], MD       haloperidol (  HALDOL) tablet 5 mg  5 mg Oral BID Jolie Strohecker, [name], MD   5 mg at 06/09/22 1610   Or   haloperidol lactate (HALDOL) injection 5 mg  5 mg Intramuscular BID Aubriauna Riner T, MD       lithium carbonate capsule 600 mg  600 mg Oral QHS Shannon Kirkendall T, MD   600 mg at 06/08/22 2110   LORazepam (ATIVAN) tablet 1 mg  1 mg Oral BID Lashena Signer T, MD   1 mg at 06/09/22 0820   Or   LORazepam (ATIVAN) injection 1 mg  1 mg Intramuscular BID Keilynn Marano T, MD       magnesium hydroxide (MILK OF MAGNESIA) suspension 30 mL  30 mL Oral Daily PRN Christo Hain T, MD       mirtazapine (REMERON) tablet 30 mg  30 mg Oral QHS Kensington Rios T, MD   30 mg at 06/08/22 2110    multivitamin with minerals tablet 1 tablet  1 tablet Oral Daily Ho Parisi, [name], MD   1 tablet at 06/09/22 0820   QUEtiapine (SEROQUEL) tablet 600 mg  600 mg Oral QHS Reggie Pile, MD   600 mg at 06/08/22 2110   thiamine (VITAMIN B1) tablet 100 mg  100 mg Oral Daily Madisan Bice, [name], MD   100 mg at 06/09/22 0820    Lab Results: No results found for this or any previous visit (from the past 48 hour(s)).  Blood Alcohol level:  Lab Results  Component Value Date   ETH <10 03/29/2022   ETH <10 09/12/2021    Metabolic Disorder Labs: Lab Results  Component Value Date   HGBA1C 5.6 10/21/2021   MPG 114 10/21/2021   MPG 111 10/02/2021   Lab Results  Component Value Date   PROLACTIN 13.6 04/16/2015   Lab Results  Component Value Date   CHOL 189 10/21/2021   TRIG 52 10/21/2021   HDL 50 10/21/2021   CHOLHDL 3.8 10/21/2021   VLDL 10 10/21/2021   LDLCALC 129 (H) 10/21/2021   LDLCALC 93 10/01/2020    Physical Findings: AIMS: Facial and Oral Movements Muscles of Facial Expression: None, normal Lips and Perioral Area: None, normal Jaw: None, normal Tongue: None, normal,Extremity Movements Upper (arms, wrists, hands, fingers): None, normal Lower (legs, knees, ankles, toes): None, normal, Trunk Movements Neck, shoulders, hips: None, normal, Overall Severity Severity of abnormal movements (highest score from questions above): None, normal Incapacitation due to abnormal movements: None, normal Patient's awareness of abnormal movements (rate only patient's report): No Awareness, Dental Status Current problems with teeth and/or dentures?: No Does patient usually wear dentures?: No  CIWA:    COWS:     Musculoskeletal: Strength & Muscle Tone: within normal limits Gait & Station: normal Patient leans: [name]  Psychiatric Specialty Exam:  Presentation  General Appearance:  Disheveled  Eye Contact: Fair  Speech: Pressured  Speech  Volume: Increased  Handedness: Right   Mood and Affect  Mood: Irritable; Labile; Angry  Affect: Blunt; Labile   Thought Process  Thought Processes: Disorganized; Irrevelant  Descriptions of Associations:Loose  Orientation:Partial  Thought Content:Illogical; Rumination; Scattered; Tangential; Delusions  History of Schizophrenia/Schizoaffective disorder:Yes  Duration of Psychotic Symptoms:Greater than six months  Hallucinations:No data recorded Ideas of Reference:Percusatory; Paranoia  Suicidal Thoughts:No data recorded Homicidal Thoughts:No data recorded  Sensorium  Memory: Immediate Fair; Recent Fair; Remote Fair  Judgment: Impaired  Insight: Poor; Lacking   Executive Functions  Concentration: Poor  Attention Span: Poor  Recall: Poor  Fund of Knowledge: Poor  Language: Poor   Psychomotor Activity  Psychomotor Activity:No data recorded  Assets  Assets: Physical Health; Resilience; Housing; Social Support   Sleep  Sleep:No data recorded   Physical Exam: Physical Exam Vitals reviewed.  Constitutional:      Appearance: Normal appearance.  HENT:     Head: Normocephalic and atraumatic.     Mouth/Throat:     Pharynx: Oropharynx is clear.  Eyes:     Pupils: Pupils are equal, round, and reactive to light.  Cardiovascular:     Rate and Rhythm: Normal rate and regular rhythm.  Pulmonary:     Effort: Pulmonary effort is normal.     Breath sounds: Normal breath sounds.  Abdominal:     General: Abdomen is flat.     Palpations: Abdomen is soft.  Musculoskeletal:        General: Normal range of motion.  Skin:    General: Skin is warm and dry.  Neurological:     General: No focal deficit present.     Mental Status: She is alert. Mental status is at baseline.  Psychiatric:        Attention and Perception: She is inattentive.        Mood and Affect: Mood normal. Affect is blunt.        Speech: Speech is delayed.        Behavior:  Behavior is slowed.        Thought Content: Thought content is paranoid.    Review of Systems  Constitutional: Negative.   HENT: Negative.    Eyes: Negative.   Respiratory: Negative.    Cardiovascular: Negative.   Gastrointestinal: Negative.   Musculoskeletal: Negative.   Skin: Negative.   Neurological: Negative.   Psychiatric/Behavioral: Negative.     Blood pressure 107/66, pulse 65, temperature 98.4 F (36.9 C), temperature source Oral, resp. rate 18, height 5\' 4"  (1.626 m), weight 40.4 kg, SpO2 100 %. Body mass index is 15.28 kg/m.   Treatment Plan Summary: Plan no change to medication management with Seroquel.  Keep encouraging her to try to get out of bed.  Meanwhile treatment team and social work are trying to pursue any sort of options for discharge planning.  , MD 06/09/2022, 11:43 AM

## 2022-06-09 NOTE — [facility] Group Notes (Signed)
[facility] Group Notes:  (Nursing/MHT/Case Management/Adjunct)  Date:  06/09/2022  Time:  8:17 PM  Type of Therapy:   Wrap up  Participation Level:  Active  Participation Quality:  Appropriate  Affect:  Appropriate  Cognitive:  Alert  Insight:  Good  Engagement in Group:  Engaged and she talk about being discharge.  Modes of Intervention:  Support  Summary of Progress/Problems:  [name] 06/09/2022, 8:17 PM

## 2022-06-09 NOTE — Group Note (Addendum)
[facility] LCSW Group Therapy Note   Group Date: 06/09/2022 Start Time: 1300 End Time: 1400  Type of Therapy/Topic:  Group Therapy:  Feelings about Diagnosis  Participation Level:  Did Not Attend    Description of Group:    This group will allow patients to explore their thoughts and feelings about diagnoses they have received. Patients will be guided to explore their level of understanding and acceptance of these diagnoses. Facilitator will encourage patients to process their thoughts and feelings about the reactions of others to their diagnosis, and will guide patients in identifying ways to discuss their diagnosis with significant others in their lives. This group will be process-oriented, with patients participating in exploration of their own experiences as well as giving and receiving support and challenge from other group members.   Therapeutic Goals: 1. Patient will demonstrate understanding of diagnosis as evidence by identifying two or more symptoms of the disorder:  2. Patient will be able to express two feelings regarding the diagnosis 3. Patient will demonstrate ability to communicate their needs through discussion and/or role plays  Summary of Patient Progress: Patient declined to attend group despite invitation.   Therapeutic Modalities:   Cognitive Behavioral Therapy Brief Therapy Feelings Identification    Shanan Fitzpatrick R Ajah Vanhoose, LCSW 

## 2022-06-09 NOTE — Progress Notes (Signed)
Patient is pleasant and cooperative.  She remains isolative to herself and has minimal contact with both staff and peers. She does engage well in conversation, but onl;y when approached. She is med complaint with no issues or concerns. Denies having any psychotic symptoms.  Will continue to monitor. Support and encouragement offered.  Q 15 minute safety check in place.    C Butler-Nicholson, LPN

## 2022-06-10 DIAGNOSIS — F251 Schizoaffective disorder, depressive type: Secondary | ICD-10-CM | POA: Diagnosis not present

## 2022-06-10 NOTE — Plan of Care (Signed)
Pt denies anxiety/depression at this time. Pt denies SI/HI/AVH or pain at this time. Pt is calm and cooperative. Pt is medication compliant however, refuses Seroquel. MD notified of pt refusal. Pt states that she believes the change to her night medication is making her dizzy. Pt observed walking in an unstable manner. Pt provided with support and encouragement. Pt monitored q15 minutes for safety per unit policy. Plan of care ongoing.   Problem: Education: Goal: Knowledge of General Education information will improve Description: Including pain rating scale, medication(s)/side effects and non-pharmacologic comfort measures Outcome: Progressing   Problem: Nutrition: Goal: Adequate nutrition will be maintained Outcome: Progressing

## 2022-06-10 NOTE — Progress Notes (Signed)
Recreation Therapy Notes  Date: 06/10/2022  Time: 10:45 am     Location: Craft room   Behavioral response: [name]   Intervention Topic: Self-care   Discussion/Intervention: Patient refused to attend group.   Clinical Observations/Feedback:  Patient refused to attend group.    Treshun Wold LRT/CTRS        Bladen Umar 06/10/2022 12:32 PM

## 2022-06-10 NOTE — Group Note (Signed)
[facility] LCSW Group Therapy Note   Group Date: 06/10/2022 Start Time: 1400 End Time: 1500   Type of Therapy/Topic:  Group Therapy:  Emotion Regulation  Participation Level:  Did Not Attend   Mood:  Description of Group:    The purpose of this group is to assist patients in learning to regulate negative emotions and experience positive emotions. Patients will be guided to discuss ways in which they have been vulnerable to their negative emotions. These vulnerabilities will be juxtaposed with experiences of positive emotions or situations, and patients challenged to use positive emotions to combat negative ones. Special emphasis will be placed on coping with negative emotions in conflict situations, and patients will process healthy conflict resolution skills.  Therapeutic Goals: Patient will identify two positive emotions or experiences to reflect on in order to balance out negative emotions:  Patient will label two or more emotions that they find the most difficult to experience:  Patient will be able to demonstrate positive conflict resolution skills through discussion or role plays:   Summary of Patient Progress:   Patient did not attend group, though encouraged to do so by this clinician.     Therapeutic Modalities:   Cognitive Behavioral Therapy Feelings Identification Dialectical Behavioral Therapy   Braley Luckenbaugh J Cherye Gaertner, LCSW 

## 2022-06-10 NOTE — Progress Notes (Signed)
Pt denies SI/HI/AVH and verbally agrees to approach staff if these become apparent or before harming themselves/others. Rates depression 0/10. Rates anxiety 0/10. Rates pain 0/10.  Scheduled medications administered to pt, per MD orders. RN provided support and encouragement to pt. Q15 min safety checks implemented and continued. Pt safe on the unit. RN will continue to monitor and intervene as needed.  Problem: Education: Goal: Knowledge of General Education information will improve Description: Including pain rating scale, medication(s)/side effects and non-pharmacologic comfort measures Outcome: Progressing    06/10/22 0806  Psych Admission Type (Psych Patients Only)  Admission Status Voluntary  Psychosocial Assessment  Patient Complaints None  Eye Contact Fair  Facial Expression Flat  Affect Appropriate to circumstance  Speech Logical/coherent  Interaction Assertive;Forwards little  Motor Activity Slow  Appearance/Hygiene Unremarkable;In scrubs  Behavior Characteristics Cooperative;Appropriate to situation;Calm  Mood Pleasant  Thought Process  Coherency WDL  Content WDL  Delusions None reported or observed  Perception WDL  Hallucination None reported or observed  Judgment WDL  Confusion None  Danger to Self  Current suicidal ideation? Denies  Danger to Others  Danger to Others None reported or observed

## 2022-06-10 NOTE — Progress Notes (Signed)
[facility] MD Progress Note  06/10/2022 12:16 PM [name]  MRN:  [id number] Subjective: Patient seen and chart reviewed.  Follow-up woman with schizoaffective disorder.  Patient has no new complaints.  No change to behavior.  Mostly stays isolated in her room.  Mostly calm but gets paranoid and agitated when conversation turns to her family.  Patient denies any suicidal ideation and not displaying dangerous behavior.  No new physical problems.  Taking care of her health adequately. Principal Problem: Schizoaffective disorder, depressive type (HCC) Diagnosis: Principal Problem:   Schizoaffective disorder, depressive type (HCC)  Total Time spent with patient: 30 minutes  Past Psychiatric History: Past history of schizoaffective disorder chronic  Past Medical History:  Past Medical History:  Diagnosis Date   Anemia 10/02/2021   Non compliance w medication regimen    Schizophrenia [facility])     Past Surgical History:  Procedure Laterality Date   BIOPSY  09/25/2021   Procedure: BIOPSY;  Surgeon: Rachael Fee, MD;  Location: [facility];  Service: Gastroenterology;;   ESOPHAGOGASTRODUODENOSCOPY (EGD) WITH PROPOFOL [name] 09/25/2021   Procedure: ESOPHAGOGASTRODUODENOSCOPY (EGD) WITH PROPOFOL;  Surgeon: Rachael Fee, MD;  Location: [facility];  Service: Gastroenterology;  Laterality: [name];  With NGT placement   Family History: History reviewed. No pertinent family history. Family Psychiatric  History: See previous Social History:  Social History   Substance and Sexual Activity  Alcohol Use Not Currently   Comment: Refuses to disclose how much     Social History   Substance and Sexual Activity  Drug Use No   Comment: Refuses to answer    Social History   Socioeconomic History   Marital status: Single    Spouse name: Not on file   Number of children: Not on file   Years of education: Not on file   Highest education level: Not on file  Occupational History   Not on file  Tobacco  Use   Smoking status: Every Day    Packs/day: 2.00    Types: Cigarettes   Smokeless tobacco: Never  Substance and Sexual Activity   Alcohol use: Not Currently    Comment: Refuses to disclose how much   Drug use: No    Comment: Refuses to answer   Sexual activity: Not Currently    Comment: refused to answer  Other Topics Concern   Not on file  Social History Narrative   Not on file   Social Determinants of Health   Financial Resource Strain: Not on file  Food Insecurity: No Food Insecurity (04/08/2022)   Hunger Vital Sign    Worried About Running Out of Food in the Last Year: Never true    Ran Out of Food in the Last Year: Never true  Transportation Needs: No Transportation Needs (04/08/2022)   PRAPARE - Administrator, Civil Service (Medical): No    Lack of Transportation (Non-Medical): No  Physical Activity: Not on file  Stress: Not on file  Social Connections: Not on file   Additional Social History:                         Sleep: Fair  Appetite:  Fair  Current Medications: Current Facility-Administered Medications  Medication Dose Route Frequency Provider Last Rate Last Admin   acetaminophen (TYLENOL) tablet 650 mg  650 mg Oral Q6H PRN Kailany Dinunzio T, MD       alum & mag hydroxide-simeth (MAALOX/MYLANTA) 200-200-20 MG/5ML suspension 30 mL  30  mL Oral Q4H PRN Gustie Bobb T, MD       haloperidol (HALDOL) tablet 5 mg  5 mg Oral BID Terrez Ander, [name], MD   5 mg at 06/10/22 2426   Or   haloperidol lactate (HALDOL) injection 5 mg  5 mg Intramuscular BID Javar Eshbach T, MD       lithium carbonate capsule 600 mg  600 mg Oral QHS [name] T, MD   600 mg at 06/09/22 2109   LORazepam (ATIVAN) tablet 1 mg  1 mg Oral BID Laiba Fuerte T, MD   1 mg at 06/10/22 8341   Or   LORazepam (ATIVAN) injection 1 mg  1 mg Intramuscular BID Emerita Berkemeier T, MD       magnesium hydroxide (MILK OF MAGNESIA) suspension 30 mL  30 mL Oral Daily PRN Evelise Reine T, MD        mirtazapine (REMERON) tablet 30 mg  30 mg Oral QHS Kaleigha Chamberlin T, MD   30 mg at 06/09/22 2109   multivitamin with minerals tablet 1 tablet  1 tablet Oral Daily Cort Dragoo, [name], MD   1 tablet at 06/10/22 0806   QUEtiapine (SEROQUEL) tablet 600 mg  600 mg Oral QHS Reggie Pile, MD   600 mg at 06/09/22 2109   thiamine (VITAMIN B1) tablet 100 mg  100 mg Oral Daily Bridie Colquhoun, [name], MD   100 mg at 06/10/22 9622    Lab Results: No results found for this or any previous visit (from the past 48 hour(s)).  Blood Alcohol level:  Lab Results  Component Value Date   ETH <10 03/29/2022   ETH <10 09/12/2021    Metabolic Disorder Labs: Lab Results  Component Value Date   HGBA1C 5.6 10/21/2021   MPG 114 10/21/2021   MPG 111 10/02/2021   Lab Results  Component Value Date   PROLACTIN 13.6 04/16/2015   Lab Results  Component Value Date   CHOL 189 10/21/2021   TRIG 52 10/21/2021   HDL 50 10/21/2021   CHOLHDL 3.8 10/21/2021   VLDL 10 10/21/2021   LDLCALC 129 (H) 10/21/2021   LDLCALC 93 10/01/2020    Physical Findings: AIMS: Facial and Oral Movements Muscles of Facial Expression: None, normal Lips and Perioral Area: None, normal Jaw: None, normal Tongue: None, normal,Extremity Movements Upper (arms, wrists, hands, fingers): None, normal Lower (legs, knees, ankles, toes): None, normal, Trunk Movements Neck, shoulders, hips: None, normal, Overall Severity Severity of abnormal movements (highest score from questions above): None, normal Incapacitation due to abnormal movements: None, normal Patient's awareness of abnormal movements (rate only patient's report): No Awareness, Dental Status Current problems with teeth and/or dentures?: No Does patient usually wear dentures?: No  CIWA:    COWS:     Musculoskeletal: Strength & Muscle Tone: within normal limits Gait & Station: normal Patient leans: [name]  Psychiatric Specialty Exam:  Presentation  General Appearance:   Disheveled  Eye Contact: Fair  Speech: Pressured  Speech Volume: Increased  Handedness: Right   Mood and Affect  Mood: Irritable; Labile; Angry  Affect: Blunt; Labile   Thought Process  Thought Processes: Disorganized; Irrevelant  Descriptions of Associations:Loose  Orientation:Partial  Thought Content:Illogical; Rumination; Scattered; Tangential; Delusions  History of Schizophrenia/Schizoaffective disorder:Yes  Duration of Psychotic Symptoms:Greater than six months  Hallucinations:No data recorded Ideas of Reference:Percusatory; Paranoia  Suicidal Thoughts:No data recorded Homicidal Thoughts:No data recorded  Sensorium  Memory: Immediate Fair; Recent Fair; Remote Fair  Judgment: Impaired  Insight: Poor; Lacking  Executive Functions  Concentration: Poor  Attention Span: Poor  Recall: Poor  Fund of Knowledge: Poor  Language: Poor   Psychomotor Activity  Psychomotor Activity:No data recorded  Assets  Assets: Physical Health; Resilience; Housing; Social Support   Sleep  Sleep:No data recorded   Physical Exam: Physical Exam Vitals and nursing note reviewed.  Constitutional:      Appearance: Normal appearance.  HENT:     Head: Normocephalic and atraumatic.     Mouth/Throat:     Pharynx: Oropharynx is clear.  Eyes:     Pupils: Pupils are equal, round, and reactive to light.  Cardiovascular:     Rate and Rhythm: Normal rate and regular rhythm.  Pulmonary:     Effort: Pulmonary effort is normal.     Breath sounds: Normal breath sounds.  Abdominal:     General: Abdomen is flat.     Palpations: Abdomen is soft.  Musculoskeletal:        General: Normal range of motion.  Skin:    General: Skin is warm and dry.  Neurological:     General: No focal deficit present.     Mental Status: She is alert. Mental status is at baseline.  Psychiatric:        Attention and Perception: She is inattentive.        Mood and Affect:  Mood normal. Affect is blunt.        Speech: She is noncommunicative.        Behavior: Behavior is withdrawn.        Thought Content: Thought content is paranoid.    Review of Systems  Constitutional: Negative.   HENT: Negative.    Eyes: Negative.   Respiratory: Negative.    Cardiovascular: Negative.   Gastrointestinal: Negative.   Musculoskeletal: Negative.   Skin: Negative.   Neurological: Negative.   Psychiatric/Behavioral: Negative.     Blood pressure 114/64, pulse 64, temperature 97.7 F (36.5 C), temperature source Oral, resp. rate 18, height 5\' 4"  (1.626 m), weight 40.4 kg, SpO2 100 %. Body mass index is 15.28 kg/m.   Treatment Plan Summary: Medication management and Plan no change to medication.  Supportive therapy and encouragement once again reviewing with patient that the main thing keeping her here is her refusal to acknowledge her family or to be more cooperative in talking about discharge planning.  , MD 06/10/2022, 12:16 PM

## 2022-06-10 NOTE — Plan of Care (Signed)
  Problem: Education: Goal: Knowledge of General Education information will improve Description: Including pain rating scale, medication(s)/side effects and non-pharmacologic comfort measures Outcome: Progressing   Problem: Nutrition: Goal: Adequate nutrition will be maintained Outcome: Progressing   Problem: Coping: Goal: Level of anxiety will decrease Outcome: Progressing   Problem: Education: Goal: Knowledge of the prescribed therapeutic regimen will improve Outcome: Progressing   Problem: Coping: Goal: Coping ability will improve Outcome: Progressing Goal: Will verbalize feelings Outcome: Progressing   Problem: Coping: Goal: Will verbalize feelings Outcome: Progressing

## 2022-06-10 NOTE — Progress Notes (Signed)
Isolative to her room. Appears to be at her baseline. Denies si  hi avh  Med compliant. No issues or concerns.  Will continue to monitor with q15 safety checks.    C Butler-Nicholson, LPN

## 2022-06-11 DIAGNOSIS — F251 Schizoaffective disorder, depressive type: Secondary | ICD-10-CM | POA: Diagnosis not present

## 2022-06-11 NOTE — Progress Notes (Signed)
[facility] MD Progress Note  06/11/2022 7:33 AM [name]  MRN:  [id number]  Subjective: Patient assessed on rounds after chart reviewed along with vital signs and discussion with the nurses.  She stated today that she is "alright", sitting in the dayroom with another client.  Sleep is "fine", appetite "fine".  Denies side effects from her medications.  She forwards little information but surprisingly she is not irritable or defensive.  Principal Problem: Schizoaffective disorder, depressive type (HCC) Diagnosis: Principal Problem:   Schizoaffective disorder, depressive type (HCC)  Total Time spent with patient: 30 minutes  Past Psychiatric History: Past history of schizophrenia  Past Medical History:  Past Medical History:  Diagnosis Date   Anemia 10/02/2021   Non compliance w medication regimen    Schizophrenia [facility])     Past Surgical History:  Procedure Laterality Date   BIOPSY  09/25/2021   Procedure: BIOPSY;  Surgeon: Rachael Fee, MD;  Location: [facility];  Service: Gastroenterology;;   ESOPHAGOGASTRODUODENOSCOPY (EGD) WITH PROPOFOL [name] 09/25/2021   Procedure: ESOPHAGOGASTRODUODENOSCOPY (EGD) WITH PROPOFOL;  Surgeon: Rachael Fee, MD;  Location: [facility];  Service: Gastroenterology;  Laterality: [name];  With NGT placement   Family History: History reviewed. No pertinent family history. Family Psychiatric  History: See previous Social History:  Social History   Substance and Sexual Activity  Alcohol Use Not Currently   Comment: Refuses to disclose how much     Social History   Substance and Sexual Activity  Drug Use No   Comment: Refuses to answer    Social History   Socioeconomic History   Marital status: Single    Spouse name: Not on file   Number of children: Not on file   Years of education: Not on file   Highest education level: Not on file  Occupational History   Not on file  Tobacco Use   Smoking status: Every Day    Packs/day: 2.00    Types:  Cigarettes   Smokeless tobacco: Never  Substance and Sexual Activity   Alcohol use: Not Currently    Comment: Refuses to disclose how much   Drug use: No    Comment: Refuses to answer   Sexual activity: Not Currently    Comment: refused to answer  Other Topics Concern   Not on file  Social History Narrative   Not on file   Social Determinants of Health   Financial Resource Strain: Not on file  Food Insecurity: No Food Insecurity (04/08/2022)   Hunger Vital Sign    Worried About Running Out of Food in the Last Year: Never true    Ran Out of Food in the Last Year: Never true  Transportation Needs: No Transportation Needs (04/08/2022)   PRAPARE - Administrator, Civil Service (Medical): No    Lack of Transportation (Non-Medical): No  Physical Activity: Not on file  Stress: Not on file  Social Connections: Not on file   Additional Social History: lives with her mother  Sleep: Good  Appetite:  Fair  Current Medications: Current Facility-Administered Medications  Medication Dose Route Frequency Provider Last Rate Last Admin   acetaminophen (TYLENOL) tablet 650 mg  650 mg Oral Q6H PRN [name], [name], MD       alum & mag hydroxide-simeth (MAALOX/MYLANTA) 200-200-20 MG/5ML suspension 30 mL  30 mL Oral Q4H PRN [name], [name], MD       haloperidol (HALDOL) tablet 5 mg  5 mg Oral BID [name], [name], MD  5 mg at 06/10/22 1644   Or   haloperidol lactate (HALDOL) injection 5 mg  5 mg Intramuscular BID [name], [name], MD       lithium carbonate capsule 600 mg  600 mg Oral QHS [name], [name], MD   600 mg at 06/10/22 2102   LORazepam (ATIVAN) tablet 1 mg  1 mg Oral BID [name], [name], MD   1 mg at 06/10/22 1644   Or   LORazepam (ATIVAN) injection 1 mg  1 mg Intramuscular BID [name], [name], MD       magnesium hydroxide (MILK OF MAGNESIA) suspension 30 mL  30 mL Oral Daily PRN [name], [name], MD       mirtazapine (REMERON) tablet 30 mg  30 mg Oral QHS [name], [name],  MD   30 mg at 06/10/22 2101   multivitamin with minerals tablet 1 tablet  1 tablet Oral Daily [name], [name], MD   1 tablet at 06/10/22 0806   QUEtiapine (SEROQUEL) tablet 600 mg  600 mg Oral QHS Rulon Sera, MD   600 mg at 06/09/22 2109   thiamine (VITAMIN B1) tablet 100 mg  100 mg Oral Daily [name], [name], MD   100 mg at 06/10/22 [id number]    Lab Results: No results found for this or any previous visit (from the past 79 hour(s)).  Blood Alcohol level:  Lab Results  Component Value Date   ETH <10 03/29/2022   ETH <10 123XX123    Metabolic Disorder Labs: Lab Results  Component Value Date   HGBA1C 5.6 10/21/2021   MPG 114 10/21/2021   MPG 111 10/02/2021   Lab Results  Component Value Date   PROLACTIN 13.6 04/16/2015   Lab Results  Component Value Date   CHOL 189 10/21/2021   TRIG 52 10/21/2021   HDL 50 10/21/2021   CHOLHDL 3.8 10/21/2021   VLDL 10 10/21/2021   LDLCALC 129 (H) 10/21/2021   LDLCALC 93 10/01/2020    Physical Findings: AIMS: Facial and Oral Movements Muscles of Facial Expression: None, normal Lips and Perioral Area: None, normal Jaw: None, normal Tongue: None, normal,Extremity Movements Upper (arms, wrists, hands, fingers): None, normal Lower (legs, knees, ankles, toes): None, normal, Trunk Movements Neck, shoulders, hips: None, normal, Overall Severity Severity of abnormal movements (highest score from questions above): None, normal Incapacitation due to abnormal movements: None, normal Patient's awareness of abnormal movements (rate only patient's report): No Awareness, Dental Status Current problems with teeth and/or dentures?: No Does patient usually wear dentures?: No  CIWA:    COWS:     Musculoskeletal: Strength & Muscle Tone: within normal limits Gait & Station: normal Patient leans: [name]  Psychiatric Specialty Exam: General Appearance: Casual  Eye Contact:  Minimal  Speech:  WDL  Volume:  Normal  Mood:  Euthymic  Affect:   Non-CongruenFlat  Thought Process delusional  Orientation:  other; name, place "hospital"  Thought Content:  logical at times  Suicidal Thoughts:  No  Homicidal Thoughts:  No  Memory:  Immediate;   Fair Recent;   Fair Remote;   Fair  Judgement:  Fair  Insight:  Lacking  Psychomotor Activity:  WDL  Concentration:  WDL  Recall:  [employer] of Knowledge:  Fair  Language:  Good  Akathisia:  NA  Handed:    AIMS (if indicated):     Assets:  Desire for Improvement  ADL's:  intact  Cognition:  Impaired,  Mild  Sleep:  Number of  Hours: 8.5       Review of Systems  Constitutional: Negative.   HENT: Negative.    Eyes: Negative.   Respiratory: Negative.    Cardiovascular: Negative.   Gastrointestinal: Negative.   Musculoskeletal: Negative.   Skin: Negative.   Allergic/Immunologic: Negative.   Neurological: Negative.   Hematological: Negative.     Blood pressure 107/64, pulse 62, temperature 98.2 F (36.8 C), temperature source Oral, resp. rate 18, height 5\' 4"  (1.626 m), weight 40.4 kg, SpO2 99 %.Body mass index is 15.28 kg/m.     Physical Exam: Physical Exam Vitals and nursing note reviewed.  Constitutional:      Appearance: Normal appearance.  HENT:     Head: Normocephalic.  Pulmonary:     Effort: Pulmonary effort is normal.  Musculoskeletal:        General: Normal range of motion.     Cervical back: Normal range of motion.  Neurological:     General: No focal deficit present.     Mental Status: She is alert. Mental status is at baseline.  Psychiatric:        Attention and Perception: Attention normal.        Mood and Affect: Affect is inappropriate.        Speech: Speech normal.        Behavior: Behavior is not aggressive. Behavior is cooperative.        Thought Content: Thought content is delusional.        Cognition and Memory: Cognition and memory normal.    Review of Systems  Constitutional: Negative.   HENT: Negative.    Eyes: Negative.    Respiratory: Negative.    Cardiovascular: Negative.   Gastrointestinal: Negative.   Musculoskeletal: Negative.   Skin: Negative.   Neurological: Negative.    Blood pressure 107/64, pulse 62, temperature 98.2 F (36.8 C), temperature source Oral, resp. rate 18, height 5\' 4"  (1.626 m), weight 40.4 kg, SpO2 99 %. Body mass index is 15.28 kg/m.   Treatment Plan Summary: Assessment: Schizoaffetive Disorder, depressive type: Haldol 5 mg BID Lithium 600 mg at bedtime Seroquel 600 mg daily at bedtime  Insomnia Remeron 30 mg daily at bedtime  Anxiety; Ativan 1 mg BID  Plan: Placement   Waylan Boga, NP 06/11/2022, 7:33 AM

## 2022-06-11 NOTE — Plan of Care (Signed)
Pt denies anxiety/depression at this time. Pt denies SI/HI/AVH or pain at this time. Pt is calm and cooperative. Pt is medication compliant with exception of Seroquel. Pt states the medication has been making her dizzy. MD notified. Pt provided with support and encouragement. Pt monitored q15 minutes for safety per unit policy. Plan of care ongoing.   Problem: Education: Goal: Knowledge of General Education information will improve Description: Including pain rating scale, medication(s)/side effects and non-pharmacologic comfort measures Outcome: Progressing   Problem: Coping: Goal: Level of anxiety will decrease Outcome: Progressing

## 2022-06-11 NOTE — Progress Notes (Signed)
Pt denies SI/HI/AVH and verbally agrees to approach staff if these become apparent or before harming themselves/others. Rates depression 0/10. Rates anxiety 0/10. Rates pain 0/10.  Scheduled medications administered to pt, per MD orders. RN provided support and encouragement to pt. Q15 min safety checks implemented and continued. Pt safe on the unit. RN will continue to monitor and intervene as needed.  06/11/22 0800  Psych Admission Type (Psych Patients Only)  Admission Status Voluntary  Psychosocial Assessment  Patient Complaints None  Eye Contact Fair  Facial Expression Flat  Affect Appropriate to circumstance  Speech Logical/coherent  Interaction Assertive;Forwards little  Motor Activity Slow  Appearance/Hygiene Unremarkable;In scrubs  Behavior Characteristics Cooperative;Appropriate to situation;Calm  Mood Pleasant  Thought Process  Coherency WDL  Content WDL  Delusions None reported or observed  Perception WDL  Hallucination None reported or observed  Judgment WDL  Confusion None  Danger to Self  Current suicidal ideation? Denies  Danger to Others  Danger to Others None reported or observed

## 2022-06-11 NOTE — Plan of Care (Signed)
  Problem: Education: Goal: Knowledge of General Education information will improve Description Including pain rating scale, medication(s)/side effects and non-pharmacologic comfort measures Outcome: Adequate for Discharge   Problem: Health Behavior/Discharge Planning: Goal: Ability to manage health-related needs will improve Outcome: Adequate for Discharge   

## 2022-06-11 NOTE — Group Note (Signed)
LCSW Group Therapy Note  Group Date: 06/11/2022 Start Time: 1300 End Time: 1400   Type of Therapy and Topic:  Group Therapy: Using "I" Statements  Participation Level:  Did Not Attend  Description of Group:  Patients were asked to provide details of some interpersonal conflicts they have experienced. Patients were then educated about "I" statements, communication which focuses on feelings or views of the speaker rather than what the other person is doing. T group members were asked to reflect on past conflicts and to provide specific examples for utilizing "I" statements.  Therapeutic Goals:  Patients will verbalize understanding of ineffective communication and effective communication. Patients will be able to empathize with whom they are having conflict. Patients will practice effective communication in the form of "I" statements.    Summary of Patient Progress:   Patients provided with worksheet to assist them in writing a letter of appreciation in observance of thanksgivings day. Patient's spending time amongst each other watching the Thanksgivings day football games in day room.   Therapeutic Modalities:   Cognitive Behavioral Therapy Solution-Focused Therapy    Corky Crafts, [place] 06/11/2022  1:36 PM

## 2022-06-12 DIAGNOSIS — F251 Schizoaffective disorder, depressive type: Secondary | ICD-10-CM | POA: Diagnosis not present

## 2022-06-12 NOTE — Progress Notes (Signed)
Recreation Therapy Notes  Date: 06/12/2022   Time: 10:25 am      Location: Craft room    Behavioral response: [name]   Intervention Topic: Strengths    Discussion/Intervention: Patient refused to attend group.    Clinical Observations/Feedback:  Patient refused to attend group.    Tezra Mahr LRT/CTRS        Jaquawn Saffran 06/12/2022 10:39 AM 

## 2022-06-12 NOTE — Progress Notes (Signed)
[facility] MD Progress Note  06/12/2022 11:35 AM [name]  MRN:  [id number]  Subjective: Patient assessed on rounds after chart reviewed along with vital signs and discussion with the nurses. [name] was present in the dayroom along with two other patients. Reports she is "fine" with other patients present. Reports her sleep is "fine", and her appetite "fine".  Denies any side effects from her medications. Denies interest in self-harm, suicidal ideation, or homicidal ideations. Denies any visual and/or auditory hallucinations.  She is not irritable or defensive.  Awaiting placement.  Principal Problem: Schizoaffective disorder, depressive type (HCC) Diagnosis: Principal Problem:   Schizoaffective disorder, depressive type (HCC)  Total Time spent with patient: 30 minutes  Past Psychiatric History: Past history of schizophrenia  Past Medical History:  Past Medical History:  Diagnosis Date   Anemia 10/02/2021   Non compliance w medication regimen    Schizophrenia [facility])     Past Surgical History:  Procedure Laterality Date   BIOPSY  09/25/2021   Procedure: BIOPSY;  Surgeon: Rachael Fee, MD;  Location: [facility];  Service: Gastroenterology;;   ESOPHAGOGASTRODUODENOSCOPY (EGD) WITH PROPOFOL [name] 09/25/2021   Procedure: ESOPHAGOGASTRODUODENOSCOPY (EGD) WITH PROPOFOL;  Surgeon: Rachael Fee, MD;  Location: [facility];  Service: Gastroenterology;  Laterality: [name];  With NGT placement   Family History: History reviewed. No pertinent family history. Family Psychiatric  History: See previous Social History:  Social History   Substance and Sexual Activity  Alcohol Use Not Currently   Comment: Refuses to disclose how much     Social History   Substance and Sexual Activity  Drug Use No   Comment: Refuses to answer    Social History   Socioeconomic History   Marital status: Single    Spouse name: Not on file   Number of children: Not on file   Years of education: Not on file    Highest education level: Not on file  Occupational History   Not on file  Tobacco Use   Smoking status: Every Day    Packs/day: 2.00    Types: Cigarettes   Smokeless tobacco: Never  Substance and Sexual Activity   Alcohol use: Not Currently    Comment: Refuses to disclose how much   Drug use: No    Comment: Refuses to answer   Sexual activity: Not Currently    Comment: refused to answer  Other Topics Concern   Not on file  Social History Narrative   Not on file   Social Determinants of Health   Financial Resource Strain: Not on file  Food Insecurity: No Food Insecurity (04/08/2022)   Hunger Vital Sign    Worried About Running Out of Food in the Last Year: Never true    Ran Out of Food in the Last Year: Never true  Transportation Needs: No Transportation Needs (04/08/2022)   PRAPARE - Administrator, Civil Service (Medical): No    Lack of Transportation (Non-Medical): No  Physical Activity: Not on file  Stress: Not on file  Social Connections: Not on file   Additional Social History: lives with her mother  Sleep: Good  Appetite:  Fair  Current Medications: Current Facility-Administered Medications  Medication Dose Route Frequency Provider Last Rate Last Admin   acetaminophen (TYLENOL) tablet 650 mg  650 mg Oral Q6H PRN [name], [name], MD       alum & mag hydroxide-simeth (MAALOX/MYLANTA) 200-200-20 MG/5ML suspension 30 mL  30 mL Oral Q4H PRN [name], [name],  MD       haloperidol (HALDOL) tablet 5 mg  5 mg Oral BID [name], [name], MD   5 mg at 06/12/22 3716   Or   haloperidol lactate (HALDOL) injection 5 mg  5 mg Intramuscular BID [name], [name], MD       lithium carbonate capsule 600 mg  600 mg Oral QHS [name], [name], MD   600 mg at 06/11/22 2101   LORazepam (ATIVAN) tablet 1 mg  1 mg Oral BID [name], [name], MD   1 mg at 06/12/22 9678   Or   LORazepam (ATIVAN) injection 1 mg  1 mg Intramuscular BID [name], [name], MD       magnesium hydroxide  (MILK OF MAGNESIA) suspension 30 mL  30 mL Oral Daily PRN [name], [name], MD       mirtazapine (REMERON) tablet 30 mg  30 mg Oral QHS [name], [name], MD   30 mg at 06/11/22 2101   multivitamin with minerals tablet 1 tablet  1 tablet Oral Daily [name], [name], MD   1 tablet at 06/12/22 0834   QUEtiapine (SEROQUEL) tablet 600 mg  600 mg Oral QHS Reggie Pile, MD   600 mg at 06/09/22 2109   thiamine (VITAMIN B1) tablet 100 mg  100 mg Oral Daily [name], [name], MD   100 mg at 06/12/22 9381    Lab Results: No results found for this or any previous visit (from the past 48 hour(s)).  Blood Alcohol level:  Lab Results  Component Value Date   ETH <10 03/29/2022   ETH <10 09/12/2021    Metabolic Disorder Labs: Lab Results  Component Value Date   HGBA1C 5.6 10/21/2021   MPG 114 10/21/2021   MPG 111 10/02/2021   Lab Results  Component Value Date   PROLACTIN 13.6 04/16/2015   Lab Results  Component Value Date   CHOL 189 10/21/2021   TRIG 52 10/21/2021   HDL 50 10/21/2021   CHOLHDL 3.8 10/21/2021   VLDL 10 10/21/2021   LDLCALC 129 (H) 10/21/2021   LDLCALC 93 10/01/2020    Physical Findings: AIMS: Facial and Oral Movements Muscles of Facial Expression: None, normal Lips and Perioral Area: None, normal Jaw: None, normal Tongue: None, normal,Extremity Movements Upper (arms, wrists, hands, fingers): None, normal Lower (legs, knees, ankles, toes): None, normal, Trunk Movements Neck, shoulders, hips: None, normal, Overall Severity Severity of abnormal movements (highest score from questions above): None, normal Incapacitation due to abnormal movements: None, normal Patient's awareness of abnormal movements (rate only patient's report): No Awareness, Dental Status Current problems with teeth and/or dentures?: No Does patient usually wear dentures?: No  CIWA:    COWS:     Musculoskeletal: Strength & Muscle Tone: within normal limits Gait & Station: normal Patient leans:  [name]  Psychiatric Specialty Exam: General Appearance: Casual  Eye Contact:  Minimal  Speech:  WDL  Volume:  Normal  Mood:  Euthymic  Affect:  Non-CongruenFlat  Thought Process delusional  Orientation:  other; name, place "hospital"  Thought Content:  logical at times  Suicidal Thoughts:  No  Homicidal Thoughts:  No  Memory:  Immediate;   Fair Recent;   Fair Remote;   Fair  Judgement:  Fair  Insight:  Lacking  Psychomotor Activity:  WDL  Concentration:  WDL  Recall:  [employer] of Knowledge:  Fair  Language:  Good  Akathisia:  NA  Handed:    AIMS (if indicated):  Assets:  Desire for Improvement  ADL's:  intact  Cognition:  Impaired,  Mild  Sleep:  Number of Hours: 8.5       Review of Systems  Constitutional: Negative.   HENT: Negative.    Eyes: Negative.   Respiratory: Negative.    Cardiovascular: Negative.   Gastrointestinal: Negative.   Musculoskeletal: Negative.   Skin: Negative.   Allergic/Immunologic: Negative.   Neurological: Negative.   Hematological: Negative.     Blood pressure 107/61, pulse (!) 56, temperature 98.3 F (36.8 C), temperature source Oral, resp. rate 18, height 5\' 4"  (1.626 m), weight 40.4 kg, SpO2 99 %.Body mass index is 15.28 kg/m.     Physical Exam: Physical Exam Vitals and nursing note reviewed.  Constitutional:      Appearance: Normal appearance.  HENT:     Head: Normocephalic.  Pulmonary:     Effort: Pulmonary effort is normal.  Musculoskeletal:        General: Normal range of motion.     Cervical back: Normal range of motion.  Neurological:     General: No focal deficit present.     Mental Status: She is alert. Mental status is at baseline.  Psychiatric:        Attention and Perception: Attention normal.        Mood and Affect: Affect is flat.        Speech: Speech normal.        Behavior: Behavior is not aggressive. Behavior is cooperative.        Thought Content: Thought content normal.        Cognition and  Memory: Cognition and memory normal.    Review of Systems  Constitutional: Negative.   HENT: Negative.    Eyes: Negative.   Respiratory: Negative.    Cardiovascular: Negative.   Gastrointestinal: Negative.   Musculoskeletal: Negative.   Skin: Negative.   Neurological: Negative.    Blood pressure 107/61, pulse (!) 56, temperature 98.3 F (36.8 C), temperature source Oral, resp. rate 18, height 5\' 4"  (1.626 m), weight 40.4 kg, SpO2 99 %. Body mass index is 15.28 kg/m.   Treatment Plan Summary: Assessment: Schizoaffetive Disorder, depressive type: Haldol 5 mg BID Lithium 600 mg at bedtime Seroquel 600 mg daily at bedtime  Insomnia Remeron 30 mg daily at bedtime  Anxiety; Ativan 1 mg BID  Plan: Placement   , NP 06/12/2022, 11:35 AM

## 2022-06-12 NOTE — Plan of Care (Signed)
D: Patient alert and oriented. Patient denies pain. Patient denies anxiety and depression. Patient denies SI/HI/AVH. Patient was isolative to room during shift with exception to coming out for meals and medication. Patient did refuse dinner this shift stating that she wasn't hungry.  A: Scheduled medications administered to patient, per MD orders.  Support and encouragement provided to patient.  Q15 minute safety checks maintained.   R: Patient compliant with medication administration and treatment plan. No adverse drug reactions noted. Patient remains safe on the unit at this time. Problem: Education: Goal: Knowledge of [place] [facility] Education information/materials will improve Outcome: Progressing Goal: Verbalization of understanding the information provided will improve Outcome: Progressing   Problem: Health Behavior/Discharge Planning: Goal: Compliance with treatment plan for underlying cause of condition will improve Outcome: Progressing   Problem: Physical Regulation: Goal: Ability to maintain clinical measurements within normal limits will improve Outcome: Progressing   Problem: Safety: Goal: Periods of time without injury will increase Outcome: Progressing

## 2022-06-12 NOTE — Group Note (Signed)
[facility] LCSW Group Therapy Note   Group Date: 06/12/2022 Start Time: 1300 End Time: 1400  Type of Therapy and Topic:  Group Therapy:  Feelings around Relapse and Recovery  Participation Level:  Did Not Attend    Description of Group:    Patients in this group will discuss emotions they experience before and after a relapse. They will process how experiencing these feelings, or avoidance of experiencing them, relates to having a relapse. Facilitator will guide patients to explore emotions they have related to recovery. Patients will be encouraged to process which emotions are more powerful. They will be guided to discuss the emotional reaction significant others in their lives may have to patients' relapse or recovery. Patients will be assisted in exploring ways to respond to the emotions of others without this contributing to a relapse.  Therapeutic Goals: Patient will identify two or more emotions that lead to relapse for them:  Patient will identify two emotions that result when they relapse:  Patient will identify two emotions related to recovery:  Patient will demonstrate ability to communicate their needs through discussion and/or role plays.   Summary of Patient Progress: X   Therapeutic Modalities:   Cognitive Behavioral Therapy Solution-Focused Therapy Assertiveness Training Relapse Prevention Therapy   Alka Falwell R Adaysha Dubinsky, LCSW 

## 2022-06-13 DIAGNOSIS — F251 Schizoaffective disorder, depressive type: Secondary | ICD-10-CM | POA: Diagnosis not present

## 2022-06-13 NOTE — Progress Notes (Signed)
Patient presents with flat affect and depressed mood. Patient is med compliant and ate breakfast and lunch but did not eat dinner. Patient denies SI,HI, and A/V/H with no plan or intent. Patient remains mostly isolative and refused ordered EKG today despite providing explanation. MD notified. Patient responded with "My heart is fine." Will continue to check back with patient. Patient remains safe on unit with 15 minute safety checks in place.    06/13/22 0805  Psych Admission Type (Psych Patients Only)  Admission Status Voluntary  Psychosocial Assessment  Patient Complaints Sleep disturbance  Eye Contact Fair  Facial Expression Flat  Affect Depressed  Speech Logical/coherent  Interaction Minimal  Motor Activity Slow  Appearance/Hygiene In scrubs  Behavior Characteristics Cooperative  Mood Depressed  Thought Process  Coherency WDL  Content WDL  Delusions None reported or observed  Perception WDL  Hallucination None reported or observed  Judgment Limited  Confusion None  Danger to Self  Current suicidal ideation? Denies  Danger to Others  Danger to Others None reported or observed

## 2022-06-13 NOTE — Progress Notes (Signed)
[facility] MD Progress Note  06/13/2022 1:02 PM [name]  MRN:  [id number]  Subjective:  I'm ok" Chart reviewed, labs and medications reviewed.  Discussed with the nursing staff. Per nursing-No new behavioral issues on shift. Patient seemed to sleep well through out the night. Patient was isolative to room during shift with exception to coming out for meals and medication. Patient did refuse dinner this shift stating that she wasn'[name] hungry.   On assessment today-patient was lying in his bed, reports she is okay denies any complaints.. Reports her sleep is "fine", and her appetite "fine".  Denies any side effects from her medications. Denies interest in self-harm, suicidal ideation, or homicidal ideations. Denies any visual and/or auditory hallucinations.  She is not irritable or defensive.  Awaiting placement.  Principal Problem: Schizoaffective disorder, depressive type (HCC) Diagnosis: Principal Problem:   Schizoaffective disorder, depressive type (HCC)  Total Time spent with patient: 35 min  Past Psychiatric History: Past history of schizophrenia  Past Medical History:  Past Medical History:  Diagnosis Date   Anemia 10/02/2021   Non compliance w medication regimen    Schizophrenia [facility])     Past Surgical History:  Procedure Laterality Date   BIOPSY  09/25/2021   Procedure: BIOPSY;  Surgeon: Rachael Fee, MD;  Location: [facility];  Service: Gastroenterology;;   ESOPHAGOGASTRODUODENOSCOPY (EGD) WITH PROPOFOL [name] 09/25/2021   Procedure: ESOPHAGOGASTRODUODENOSCOPY (EGD) WITH PROPOFOL;  Surgeon: Rachael Fee, MD;  Location: [facility];  Service: Gastroenterology;  Laterality: [name];  With NGT placement   Family History: History reviewed. No pertinent family history. Family Psychiatric  History: See previous Social History:  Social History   Substance and Sexual Activity  Alcohol Use Not Currently   Comment: Refuses to disclose how much     Social History   Substance and  Sexual Activity  Drug Use No   Comment: Refuses to answer    Social History   Socioeconomic History   Marital status: Single    Spouse name: Not on file   Number of children: Not on file   Years of education: Not on file   Highest education level: Not on file  Occupational History   Not on file  Tobacco Use   Smoking status: Every Day    Packs/day: 2.00    Types: Cigarettes   Smokeless tobacco: Never  Substance and Sexual Activity   Alcohol use: Not Currently    Comment: Refuses to disclose how much   Drug use: No    Comment: Refuses to answer   Sexual activity: Not Currently    Comment: refused to answer  Other Topics Concern   Not on file  Social History Narrative   Not on file   Social Determinants of Health   Financial Resource Strain: Not on file  Food Insecurity: No Food Insecurity (04/08/2022)   Hunger Vital Sign    Worried About Running Out of Food in the Last Year: Never true    Ran Out of Food in the Last Year: Never true  Transportation Needs: No Transportation Needs (04/08/2022)   PRAPARE - Administrator, Civil Service (Medical): No    Lack of Transportation (Non-Medical): No  Physical Activity: Not on file  Stress: Not on file  Social Connections: Not on file   Additional Social History: lives with her mother  Sleep: Good  Appetite:  Fair  Current Medications: Current Facility-Administered Medications  Medication Dose Route Frequency Provider Last Rate Last Admin   acetaminophen (  TYLENOL) tablet 650 mg  650 mg Oral Q6H PRN [name], [name], MD       alum & mag hydroxide-simeth (MAALOX/MYLANTA) 200-200-20 MG/5ML suspension 30 mL  30 mL Oral Q4H PRN [name], [name], MD       haloperidol (HALDOL) tablet 5 mg  5 mg Oral BID [name], [name], MD   5 mg at 06/13/22 1540   Or   haloperidol lactate (HALDOL) injection 5 mg  5 mg Intramuscular BID [name], [name], MD       lithium carbonate capsule 600 mg  600 mg Oral QHS [name], [name], MD    600 mg at 06/12/22 2110   LORazepam (ATIVAN) tablet 1 mg  1 mg Oral BID [name], [name], MD   1 mg at 06/13/22 0820   Or   LORazepam (ATIVAN) injection 1 mg  1 mg Intramuscular BID [name], [name], MD       magnesium hydroxide (MILK OF MAGNESIA) suspension 30 mL  30 mL Oral Daily PRN [name], [name], MD       mirtazapine (REMERON) tablet 30 mg  30 mg Oral QHS [name], [name], MD   30 mg at 06/12/22 2110   multivitamin with minerals tablet 1 tablet  1 tablet Oral Daily [name], Paula Denmark, MD   1 tablet at 06/13/22 0820   QUEtiapine (SEROQUEL) tablet 600 mg  600 mg Oral QHS Paula Pile, MD   600 mg at 06/12/22 2110   thiamine (VITAMIN B1) tablet 100 mg  100 mg Oral Daily [name], Paula Denmark, MD   100 mg at 06/13/22 0820    Lab Results: No results found for this or any previous visit (from the past 48 hour(s)).  Blood Alcohol level:  Lab Results  Component Value Date   ETH <10 03/29/2022   ETH <10 09/12/2021    Metabolic Disorder Labs: Lab Results  Component Value Date   HGBA1C 5.6 10/21/2021   MPG 114 10/21/2021   MPG 111 10/02/2021   Lab Results  Component Value Date   PROLACTIN 13.6 04/16/2015   Lab Results  Component Value Date   CHOL 189 10/21/2021   TRIG 52 10/21/2021   HDL 50 10/21/2021   CHOLHDL 3.8 10/21/2021   VLDL 10 10/21/2021   LDLCALC 129 (H) 10/21/2021   LDLCALC 93 10/01/2020    Physical Findings: AIMS: Facial and Oral Movements Muscles of Facial Expression: None, normal Lips and Perioral Area: None, normal Jaw: None, normal Tongue: None, normal,Extremity Movements Upper (arms, wrists, hands, fingers): None, normal Lower (legs, knees, ankles, toes): None, normal, Trunk Movements Neck, shoulders, hips: None, normal, Overall Severity Severity of abnormal movements (highest score from questions above): None, normal Incapacitation due to abnormal movements: None, normal Patient's awareness of abnormal movements (rate only patient's report): No Awareness, Dental  Status Current problems with teeth and/or dentures?: No Does patient usually wear dentures?: No  CIWA:    COWS:     Musculoskeletal: Strength & Muscle Tone: within normal limits Gait & Station: normal Patient leans: [name]  Psychiatric Specialty Exam: General Appearance: Casual  Eye Contact:  Minimal  Speech:  WDL  Volume:  Normal  Mood:  Euthymic  Affect:  Non-CongruenFlat  Thought Process delusional  Orientation:  other; name, place "hospital"  Thought Content:  logical at times  Suicidal Thoughts:  No  Homicidal Thoughts:  No  Memory:  Immediate;   Fair Recent;   Fair Remote;   Fair  Judgement:  Fair  Insight:  Lacking  Psychomotor Activity:  WDL  Concentration:  WDL  Recall:  [employer] of Knowledge:  Fair  Language:  Good  Akathisia:  NA  Handed:    AIMS (if indicated):     Assets:  Desire for Improvement  ADL's:  intact  Cognition:  Impaired,  Mild  Sleep:  Number of Hours: 8.5       Review of Systems  Constitutional: Negative.   HENT: Negative.    Eyes: Negative.   Respiratory: Negative.    Cardiovascular: Negative.   Gastrointestinal: Negative.   Musculoskeletal: Negative.   Skin: Negative.   Allergic/Immunologic: Negative.   Neurological: Negative.   Hematological: Negative.     Blood pressure 111/70, pulse 62, temperature 98.4 F (36.9 C), temperature source Oral, resp. rate 18, height 5\' 4"  (1.626 m), weight 40.4 kg, SpO2 99 %.Body mass index is 15.28 kg/m.     Physical Exam: Physical Exam Vitals and nursing note reviewed.  Constitutional:      Appearance: Normal appearance.  HENT:     Head: Normocephalic.  Pulmonary:     Effort: Pulmonary effort is normal.  Musculoskeletal:        General: Normal range of motion.     Cervical back: Normal range of motion.  Neurological:     General: No focal deficit present.     Mental Status: She is alert. Mental status is at baseline.  Psychiatric:        Attention and Perception: Attention  normal.        Mood and Affect: Affect is flat.        Speech: Speech normal.        Behavior: Behavior is not aggressive. Behavior is cooperative.        Thought Content: Thought content normal.        Cognition and Memory: Cognition and memory normal.    Review of Systems  Constitutional: Negative.   HENT: Negative.    Eyes: Negative.   Respiratory: Negative.    Cardiovascular: Negative.   Gastrointestinal: Negative.   Musculoskeletal: Negative.   Skin: Negative.   Neurological: Negative.    Blood pressure 111/70, pulse 62, temperature 98.4 F (36.9 C), temperature source Oral, resp. rate 18, height 5\' 4"  (1.626 m), weight 40.4 kg, SpO2 99 %. Body mass index is 15.28 kg/m.   Treatment Plan Summary: No EKG since 9/12, ordered for QTc.  Assessment: Schizoaffetive Disorder, depressive type: Haldol 5 mg BID Lithium 600 mg at bedtime Seroquel 600 mg daily at bedtime  Insomnia Remeron 30 mg daily at bedtime  Anxiety; Ativan 1 mg BID  Plan: Placement   , MD 06/13/2022, 1:02 PMPatient ID: [name], female   DOB: 1960/09/26, 61 y.o.   MRN: 12/12/1960

## 2022-06-13 NOTE — Progress Notes (Signed)
No new behavioral issues on shift. Patient seemed to sleep well through out the night.

## 2022-06-13 NOTE — Plan of Care (Signed)
  Problem: Safety: Goal: Ability to remain free from injury will improve Outcome: Progressing   Problem: Education: Goal: Knowledge of the prescribed therapeutic regimen will improve Outcome: Progressing   

## 2022-06-14 DIAGNOSIS — F251 Schizoaffective disorder, depressive type: Secondary | ICD-10-CM | POA: Diagnosis not present

## 2022-06-14 NOTE — Progress Notes (Signed)
No new behavioral issues to report on shift. Patient seemed to sleep well through out the night.

## 2022-06-14 NOTE — Plan of Care (Signed)
D: Patient alert and oriented. Patient denies pain. Patient denies anxiety and depression. Patient denies SI/HI/AVH. Patient remains isolative to room with exception to coming out for meals and medication.  A: Scheduled medications administered to patient, per MD orders.  Support and encouragement provided to patient.  Q15 minute safety checks maintained.   R: Patient compliant with medication administration and treatment plan. No adverse drug reactions noted. Patient remains safe on the unit at this time. Problem: Education: Goal: Knowledge of [facility] Education information/materials will improve Outcome: Progressing Goal: Verbalization of understanding the information provided will improve Outcome: Progressing   Problem: Health Behavior/Discharge Planning: Goal: Compliance with treatment plan for underlying cause of condition will improve Outcome: Progressing   Problem: Physical Regulation: Goal: Ability to maintain clinical measurements within normal limits will improve Outcome: Progressing   Problem: Safety: Goal: Periods of time without injury will increase Outcome: Progressing

## 2022-06-14 NOTE — BH IP Treatment Plan (Signed)
Interdisciplinary Treatment and Diagnostic Plan Update  06/14/2022 Time of Session: 0830 [name] MRN: [id number]  Principal Diagnosis: Schizoaffective disorder, depressive type [facility])  Secondary Diagnoses: Principal Problem:   Schizoaffective disorder, depressive type (HCC)   Current Medications:  Current Facility-Administered Medications  Medication Dose Route Frequency Provider Last Rate Last Admin   acetaminophen (TYLENOL) tablet 650 mg  650 mg Oral Q6H PRN [name], [name], MD       alum & mag hydroxide-simeth (MAALOX/MYLANTA) 200-200-20 MG/5ML suspension 30 mL  30 mL Oral Q4H PRN [name], [name], MD       haloperidol (HALDOL) tablet 5 mg  5 mg Oral BID [name], [name], MD   5 mg at 06/14/22 0827   Or   haloperidol lactate (HALDOL) injection 5 mg  5 mg Intramuscular BID [name], [name], MD       lithium carbonate capsule 600 mg  600 mg Oral QHS [name], [name], MD   600 mg at 06/13/22 2110   LORazepam (ATIVAN) tablet 1 mg  1 mg Oral BID [name], [name], MD   1 mg at 06/14/22 0827   Or   LORazepam (ATIVAN) injection 1 mg  1 mg Intramuscular BID [name], [name], MD       magnesium hydroxide (MILK OF MAGNESIA) suspension 30 mL  30 mL Oral Daily PRN [name], [name], MD       mirtazapine (REMERON) tablet 30 mg  30 mg Oral QHS [name], [name], MD   30 mg at 06/13/22 2110   multivitamin with minerals tablet 1 tablet  1 tablet Oral Daily [name], [name], MD   1 tablet at 06/14/22 0827   QUEtiapine (SEROQUEL) tablet 600 mg  600 mg Oral QHS Reggie Pile, MD   600 mg at 06/13/22 2110   thiamine (VITAMIN B1) tablet 100 mg  100 mg Oral Daily [name], [name], MD   100 mg at 06/14/22 0827   PTA Medications: Medications Prior to Admission  Medication Sig Dispense Refill Last Dose   clonazePAM (KLONOPIN) 0.5 MG tablet Take 0.5 tablets (0.25 mg total) by mouth 2 (two) times daily. (Patient not taking: Reported on 03/30/2022) 60 tablet 0    haloperidol (HALDOL) 5 MG tablet Take 1 tablet (5 mg  total) by mouth 2 (two) times daily. (Patient not taking: Reported on 03/30/2022) 60 tablet 3    midodrine (PROAMATINE) 2.5 MG tablet Take 1 tablet (2.5 mg total) by mouth 3 (three) times daily with meals. (Patient not taking: Reported on 03/30/2022)  0    mirtazapine (REMERON) 15 MG tablet Take 0.5 tablets (7.5 mg total) by mouth at bedtime. (Patient not taking: Reported on 03/30/2022) 30 tablet 2    Multiple Vitamin (MULTIVITAMIN WITH MINERALS) TABS tablet Take 1 tablet by mouth daily. (Patient not taking: Reported on 03/30/2022)      OLANZapine (ZYPREXA) 5 MG tablet Take 1 tablet (5 mg total) by mouth at bedtime. (Patient not taking: Reported on 03/30/2022) 30 tablet 2    thiamine 100 MG tablet Take 1 tablet (100 mg total) by mouth daily. (Patient not taking: Reported on 03/30/2022)       Patient Stressors: Marital or family conflict    Patient Strengths: Supportive family/friends   Treatment Modalities: Medication Management, Group therapy, Case management,  1 to 1 session with clinician, Psychoeducation, Recreational therapy.   Physician Treatment Plan for Primary Diagnosis: Schizoaffective disorder, depressive type (HCC) Long Term Goal(s): Improvement in symptoms so as ready for discharge  Short Term Goals: Compliance with prescribed medications will improve Ability to verbalize feelings will improve Ability to demonstrate self-control will improve Ability to identify and develop effective coping behaviors will improve  Medication Management: Evaluate patient's response, side effects, and tolerance of medication regimen.  Therapeutic Interventions: 1 to 1 sessions, Unit Group sessions and Medication administration.  Evaluation of Outcomes: Progressing  Physician Treatment Plan for Secondary Diagnosis: Principal Problem:   Schizoaffective disorder, depressive type (HCC)  Long Term Goal(s): Improvement in symptoms so as ready for discharge   Short Term Goals: Compliance with  prescribed medications will improve Ability to verbalize feelings will improve Ability to demonstrate self-control will improve Ability to identify and develop effective coping behaviors will improve     Medication Management: Evaluate patient's response, side effects, and tolerance of medication regimen.  Therapeutic Interventions: 1 to 1 sessions, Unit Group sessions and Medication administration.  Evaluation of Outcomes: Progressing   RN Treatment Plan for Primary Diagnosis: Schizoaffective disorder, depressive type (HCC) Long Term Goal(s): Knowledge of disease and therapeutic regimen to maintain health will improve  Short Term Goals: Ability to remain free from injury will improve, Ability to verbalize frustration and anger appropriately will improve, Ability to demonstrate self-control, Ability to participate in decision making will improve, Ability to verbalize feelings will improve, Ability to disclose and discuss suicidal ideas, Ability to identify and develop effective coping behaviors will improve, and Compliance with prescribed medications will improve  Medication Management: RN will administer medications as ordered by provider, will assess and evaluate patient's response and provide education to patient for prescribed medication. RN will report any adverse and/or side effects to prescribing provider.  Therapeutic Interventions: 1 on 1 counseling sessions, Psychoeducation, Medication administration, Evaluate responses to treatment, Monitor vital signs and CBGs as ordered, Perform/monitor CIWA, COWS, AIMS and Fall Risk screenings as ordered, Perform wound care treatments as ordered.  Evaluation of Outcomes: Progressing   LCSW Treatment Plan for Primary Diagnosis: Schizoaffective disorder, depressive type (HCC) Long Term Goal(s): Safe transition to appropriate next level of care at discharge, Engage patient in therapeutic group addressing interpersonal concerns.  Short Term  Goals: Engage patient in aftercare planning with referrals and resources, Increase social support, Increase ability to appropriately verbalize feelings, Increase emotional regulation, Facilitate acceptance of mental health diagnosis and concerns, Facilitate patient progression through stages of change regarding substance use diagnoses and concerns, Identify triggers associated with mental health/substance abuse issues, and Increase skills for wellness and recovery  Therapeutic Interventions: Assess for all discharge needs, 1 to 1 time with Social worker, Explore available resources and support systems, Assess for adequacy in community support network, Educate family and significant other(s) on suicide prevention, Complete Psychosocial Assessment, Interpersonal group therapy.  Evaluation of Outcomes: Progressing   Progress in Treatment: Attending groups: No. Participating in groups: No. Taking medication as prescribed: Yes. Toleration medication: Yes. Family/Significant other contact made: Yes, individual(s) contacted:  Dorene Ar, [phone] Patient understands diagnosis: No. Discussing patient identified problems/goals with staff: Yes. Medical problems stabilized or resolved: Yes. Denies suicidal/homicidal ideation: Yes. Issues/concerns per patient self-inventory: Yes. Other: none  New problem(s) identified: No, Describe:  none  New Short Term/Long Term Goal(s): Patient to work towards elimination of symptoms of psychosis, medication management for mood stabilization;  development of comprehensive mental wellness plan.  Patient Goals:  No additional goals identified at this time. Patient to continue to work towards original goals identified in initial treatment team meeting. CSW will remain available to patient should they voice additional treatment  goals.   Discharge Plan or Barriers: Patient lacks adequate housing and/or funding, CSW continues to monitor situation and update  treatment team as information becomes available.   Reason for Continuation of Hospitalization: Other; describe boarding  Estimated Length of Stay: 1-7 days     Scribe for Treatment Team: Almedia Balls 06/14/2022 9:40 AM

## 2022-06-14 NOTE — Group Note (Signed)
[facility] LCSW Group Therapy Note   Group Date: 06/14/2022 Start Time: 1300 End Time: 1400   Type of Therapy and Topic: Group Therapy: Avoiding Self-Sabotaging and Enabling Behaviors  Participation Level: Did Not Attend  Mood:  Description of Group:  In this group, patients will learn how to identify obstacles, self-sabotaging and enabling behaviors, as well as: what are they, why do we do them and what needs these behaviors meet. Discuss unhealthy relationships and how to have positive healthy boundaries with those that sabotage and enable. Explore aspects of self-sabotage and enabling in yourself and how to limit these self-destructive behaviors in everyday life.   Therapeutic Goals: 1. Patient will identify one obstacle that relates to self-sabotage and enabling behaviors 2. Patient will identify one personal self-sabotaging or enabling behavior they did prior to admission 3. Patient will state a plan to change the above identified behavior 4. Patient will demonstrate ability to communicate their needs through discussion and/or role play.    Summary of Patient Progress:   Patient did not attend group despite encouraged participation.    Therapeutic Modalities:  Cognitive Behavioral Therapy Person-Centered Therapy Motivational Interviewing    Geraldo Haris W Jennier Schissler, LCSWA 

## 2022-06-14 NOTE — Progress Notes (Signed)
[facility] MD Progress Note  06/14/2022 2:30 PM [name]  MRN:  [id number]  Subjective:  I'm fine" Chart reviewed, labs and medications reviewed.  Discussed with the nursing staff. Per nursing-Patient presents with flat affect and depressed mood. Patient is med compliant and ate breakfast and lunch but did not eat dinner. Patient denies SI,HI, and A/V/H with no plan or intent. Patient remains mostly isolative and refused ordered EKG today despite providing explanation. MD notified. Patient responded with "My heart is fine." Will continue to check back with patient. Patient remains safe on unit with 15 minute safety checks in place.    On assessment today-patient walking in his room, denies any complaints, refusing EKG for QTC, states I'm fine , my heart fine, reports she is okay denies any complaints.. Reports her sleep is "fine", and her appetite "fine".  Denies any side effects from her medications. Denies interest in self-harm, suicidal ideation, or homicidal ideations. Denies any visual and/or auditory hallucinations.  She is not irritable or defensive.  Awaiting placement.  Principal Problem: Schizoaffective disorder, depressive type (HCC) Diagnosis: Principal Problem:   Schizoaffective disorder, depressive type (HCC)  Total Time spent with patient: 35 min  Past Psychiatric History: Past history of schizophrenia  Past Medical History:  Past Medical History:  Diagnosis Date   Anemia 10/02/2021   Non compliance w medication regimen    Schizophrenia [facility])     Past Surgical History:  Procedure Laterality Date   BIOPSY  09/25/2021   Procedure: BIOPSY;  Surgeon: Rachael Fee, MD;  Location: [facility];  Service: Gastroenterology;;   ESOPHAGOGASTRODUODENOSCOPY (EGD) WITH PROPOFOL [name] 09/25/2021   Procedure: ESOPHAGOGASTRODUODENOSCOPY (EGD) WITH PROPOFOL;  Surgeon: Rachael Fee, MD;  Location: [facility];  Service: Gastroenterology;  Laterality: [name];  With NGT placement   Family  History: History reviewed. No pertinent family history. Family Psychiatric  History: See previous Social History:  Social History   Substance and Sexual Activity  Alcohol Use Not Currently   Comment: Refuses to disclose how much     Social History   Substance and Sexual Activity  Drug Use No   Comment: Refuses to answer    Social History   Socioeconomic History   Marital status: Single    Spouse name: Not on file   Number of children: Not on file   Years of education: Not on file   Highest education level: Not on file  Occupational History   Not on file  Tobacco Use   Smoking status: Every Day    Packs/day: 2.00    Types: Cigarettes   Smokeless tobacco: Never  Substance and Sexual Activity   Alcohol use: Not Currently    Comment: Refuses to disclose how much   Drug use: No    Comment: Refuses to answer   Sexual activity: Not Currently    Comment: refused to answer  Other Topics Concern   Not on file  Social History Narrative   Not on file   Social Determinants of Health   Financial Resource Strain: Not on file  Food Insecurity: No Food Insecurity (04/08/2022)   Hunger Vital Sign    Worried About Running Out of Food in the Last Year: Never true    Ran Out of Food in the Last Year: Never true  Transportation Needs: No Transportation Needs (04/08/2022)   PRAPARE - Administrator, Civil Service (Medical): No    Lack of Transportation (Non-Medical): No  Physical Activity: Not on file  Stress:  Not on file  Social Connections: Not on file   Additional Social History: lives with her mother  Sleep: Good  Appetite:  Fair  Current Medications: Current Facility-Administered Medications  Medication Dose Route Frequency Provider Last Rate Last Admin   acetaminophen (TYLENOL) tablet 650 mg  650 mg Oral Q6H PRN [name], [name], MD       alum & mag hydroxide-simeth (MAALOX/MYLANTA) 200-200-20 MG/5ML suspension 30 mL  30 mL Oral Q4H PRN [name], [name], MD        haloperidol (HALDOL) tablet 5 mg  5 mg Oral BID [name], [name], MD   5 mg at 06/14/22 0827   Or   haloperidol lactate (HALDOL) injection 5 mg  5 mg Intramuscular BID [name], [name], MD       lithium carbonate capsule 600 mg  600 mg Oral QHS [name], [name], MD   600 mg at 06/13/22 2110   LORazepam (ATIVAN) tablet 1 mg  1 mg Oral BID [name], [name], MD   1 mg at 06/14/22 0827   Or   LORazepam (ATIVAN) injection 1 mg  1 mg Intramuscular BID [name], [name], MD       magnesium hydroxide (MILK OF MAGNESIA) suspension 30 mL  30 mL Oral Daily PRN [name], [name], MD       mirtazapine (REMERON) tablet 30 mg  30 mg Oral QHS [name], [name], MD   30 mg at 06/13/22 2110   multivitamin with minerals tablet 1 tablet  1 tablet Oral Daily [name], [name], MD   1 tablet at 06/14/22 0827   QUEtiapine (SEROQUEL) tablet 600 mg  600 mg Oral QHS Reggie Pile, MD   600 mg at 06/13/22 2110   thiamine (VITAMIN B1) tablet 100 mg  100 mg Oral Daily [name], [name], MD   100 mg at 06/14/22 0827    Lab Results: No results found for this or any previous visit (from the past 48 hour(s)).  Blood Alcohol level:  Lab Results  Component Value Date   ETH <10 03/29/2022   ETH <10 09/12/2021    Metabolic Disorder Labs: Lab Results  Component Value Date   HGBA1C 5.6 10/21/2021   MPG 114 10/21/2021   MPG 111 10/02/2021   Lab Results  Component Value Date   PROLACTIN 13.6 04/16/2015   Lab Results  Component Value Date   CHOL 189 10/21/2021   TRIG 52 10/21/2021   HDL 50 10/21/2021   CHOLHDL 3.8 10/21/2021   VLDL 10 10/21/2021   LDLCALC 129 (H) 10/21/2021   LDLCALC 93 10/01/2020    Physical Findings: AIMS: Facial and Oral Movements Muscles of Facial Expression: None, normal Lips and Perioral Area: None, normal Jaw: None, normal Tongue: None, normal,Extremity Movements Upper (arms, wrists, hands, fingers): None, normal Lower (legs, knees, ankles, toes): None, normal, Trunk Movements Neck,  shoulders, hips: None, normal, Overall Severity Severity of abnormal movements (highest score from questions above): None, normal Incapacitation due to abnormal movements: None, normal Patient's awareness of abnormal movements (rate only patient's report): No Awareness, Dental Status Current problems with teeth and/or dentures?: No Does patient usually wear dentures?: No  CIWA:    COWS:     Musculoskeletal: Strength & Muscle Tone: within normal limits Gait & Station: normal Patient leans: [name]  Psychiatric Specialty Exam: General Appearance: Casual  Eye Contact:  Minimal  Speech:  WDL  Volume:  Normal  Mood:  Euthymic  Affect:  Non-CongruenFlat  Thought Process delusional  Orientation:  other; name, place "hospital"  Thought Content:  logical at times  Suicidal Thoughts:  No  Homicidal Thoughts:  No  Memory:  Immediate;   Fair Recent;   Fair Remote;   Fair  Judgement:  Fair  Insight:  Lacking  Psychomotor Activity:  WDL  Concentration:  WDL  Recall:  [employer] of Knowledge:  Fair  Language:  Good  Akathisia:  NA  Handed:    AIMS (if indicated):     Assets:  Desire for Improvement  ADL's:  intact  Cognition:  Impaired,  Mild  Sleep:  Number of Hours: 8.5       Review of Systems  Constitutional: Negative.   HENT: Negative.    Eyes: Negative.   Respiratory: Negative.    Cardiovascular: Negative.   Gastrointestinal: Negative.   Musculoskeletal: Negative.   Skin: Negative.   Allergic/Immunologic: Negative.   Neurological: Negative.   Hematological: Negative.     Blood pressure 116/64, pulse (!) 53, temperature 98.1 F (36.7 C), temperature source Oral, resp. rate 18, height 5\' 4"  (1.626 m), weight 40.4 kg, SpO2 100 %.Body mass index is 15.28 kg/m.     Physical Exam: Physical Exam Vitals and nursing note reviewed.  Constitutional:      Appearance: Normal appearance.  HENT:     Head: Normocephalic.  Pulmonary:     Effort: Pulmonary effort is normal.   Musculoskeletal:        General: Normal range of motion.     Cervical back: Normal range of motion.  Neurological:     General: No focal deficit present.     Mental Status: She is alert. Mental status is at baseline.  Psychiatric:        Attention and Perception: Attention normal.        Mood and Affect: Affect is flat.        Speech: Speech normal.        Behavior: Behavior is not aggressive. Behavior is cooperative.        Thought Content: Thought content normal.        Cognition and Memory: Cognition and memory normal.    Review of Systems  Constitutional: Negative.   HENT: Negative.    Eyes: Negative.   Respiratory: Negative.    Cardiovascular: Negative.   Gastrointestinal: Negative.   Musculoskeletal: Negative.   Skin: Negative.   Neurological: Negative.    Blood pressure 116/64, pulse (!) 53, temperature 98.1 F (36.7 C), temperature source Oral, resp. rate 18, height 5\' 4"  (1.626 m), weight 40.4 kg, SpO2 100 %. Body mass index is 15.28 kg/m.   Treatment Plan Summary: No EKG since 9/12, ordered for QTc.- pt refusing- encouraged to cooperate.  Assessment: Schizoaffetive Disorder, depressive type: Haldol 5 mg BID Lithium 600 mg at bedtime Seroquel 600 mg daily at bedtime  Insomnia Remeron 30 mg daily at bedtime  Anxiety; Ativan 1 mg BID  Plan: Placement   , MD 06/14/2022, 2:30 PMPatient ID: [name], female   DOB: [birth year], 61 y.o.   MRN: [id number]/12/[id number] Patient ID: [name], female   DOB: 03-04-1961, 61 y.o.   MRN: 12/12/1960

## 2022-06-15 DIAGNOSIS — F251 Schizoaffective disorder, depressive type: Secondary | ICD-10-CM | POA: Diagnosis not present

## 2022-06-15 NOTE — Plan of Care (Signed)
D: Patient alert and oriented. Patient denies pain. Patient denies anxiety and depression. Patient denies SI/HI/AVH. Patient remains isolative to room during shift with exception to coming out for meals and medication.  A: Scheduled medications administered to patient, per MD orders.  Support and encouragement provided to patient.  Q15 minute safety checks maintained.   R: Patient compliant with medication administration and treatment plan. No adverse drug reactions noted. Patient remains safe on the unit at this time. Problem: Education: Goal: Knowledge of [facility] Education information/materials will improve Outcome: Progressing Goal: Verbalization of understanding the information provided will improve Outcome: Progressing   Problem: Health Behavior/Discharge Planning: Goal: Compliance with treatment plan for underlying cause of condition will improve Outcome: Progressing   Problem: Physical Regulation: Goal: Ability to maintain clinical measurements within normal limits will improve Outcome: Progressing   Problem: Safety: Goal: Periods of time without injury will increase Outcome: Progressing

## 2022-06-15 NOTE — [facility] Group Notes (Signed)
[facility] Group Notes:  (Nursing/MHT/Case Management/Adjunct)  Date:  06/15/2022  Time:  8:49 PM  Type of Therapy:   Wrap up  Participation Level:  Did Not Attend   [name] 06/15/2022, 8:49 PM

## 2022-06-15 NOTE — [facility] Counselor (Signed)
CSW followed up with the patient's sister/guardian.  Guardian reports that she has completed all but one step in trying to get the patient considered for special case assistance, which is getting "one more invoice".  She reports that she has also been advised "to spend her check down" when it arrives in the following month, or the patient will not qualify for special assistance.   She reports that it should be a relative quick turnaround for Medicaid.  Penni Homans, MSW, LCSW 06/15/2022 4:08 PM

## 2022-06-15 NOTE — Plan of Care (Signed)
  Problem: Education: Goal: Emotional status will improve Outcome: Progressing Goal: Mental status will improve Outcome: Progressing   P Problem: Activity: Goal: Sleeping patterns will improve Outcome: Progressing   Problem: Coping: Goal: Ability to verbalize frustrations and anger appropriately will improve Outcome: Progressing Goal: Ability to demonstrate self-control will improve Outcome: Progressing

## 2022-06-15 NOTE — [facility] Counselor (Signed)
CSW attempted to contact Alfar[facility] at [facility] to follow up on referral, however, was unable to speak with him or leave voicemail as the mailbox was full.  CSW to attempt again at later date.   CSW attempted to reach out to the patient's guardian and left HIPAA compliant voicemail.  Penni Homans, MSW, LCSW 06/15/2022 11:05 AM

## 2022-06-15 NOTE — Group Note (Signed)
[facility] LCSW Group Therapy Note    Group Date: 06/15/2022 Start Time: 1300 End Time: 1400  Type of Therapy and Topic:  Group Therapy:  Overcoming Obstacles  Participation Level:  [facility] PARTICIPATION LEVEL: Did Not Attend  Mood:  Description of Group:   In this group patients will be encouraged to explore what they see as obstacles to their own wellness and recovery. They will be guided to discuss their thoughts, feelings, and behaviors related to these obstacles. The group will process together ways to cope with barriers, with attention given to specific choices patients can make. Each patient will be challenged to identify changes they are motivated to make in order to overcome their obstacles. This group will be process-oriented, with patients participating in exploration of their own experiences as well as giving and receiving support and challenge from other group members.  Therapeutic Goals: 1. Patient will identify personal and current obstacles as they relate to admission. 2. Patient will identify barriers that currently interfere with their wellness or overcoming obstacles.  3. Patient will identify feelings, thought process and behaviors related to these barriers. 4. Patient will identify two changes they are willing to make to overcome these obstacles:    Summary of Patient Progress   Patient given the opportunity to attend group, however, declined.    Therapeutic Modalities:   Cognitive Behavioral Therapy Solution Focused Therapy Motivational Interviewing Relapse Prevention Therapy   Masyn Rostro J Caridad Silveira, LCSW 

## 2022-06-15 NOTE — Progress Notes (Signed)
[facility] MD Progress Note  06/15/2022 1:32 PM [name]  MRN:  [id number] Subjective: No change to this patient.  Continues to stay isolated to her room.  Eats and basically takes care of ADLs but otherwise interacts very little.  Remains paranoid in a way that is a major barrier to discharge. Principal Problem: Schizoaffective disorder, depressive type ([place]) Diagnosis: Principal Problem:   Schizoaffective disorder, depressive type ([place])  Total Time spent with patient: 20 minutes  Past Psychiatric History: Past history of schizoaffective disorder  Past Medical History:  Past Medical History:  Diagnosis Date   Anemia 10/02/2021   Non compliance w medication regimen    Schizophrenia [facility])     Past Surgical History:  Procedure Laterality Date   BIOPSY  09/25/2021   Procedure: BIOPSY;  Surgeon: Milus Banister, MD;  Location: [facility];  Service: Gastroenterology;;   ESOPHAGOGASTRODUODENOSCOPY (EGD) WITH PROPOFOL [name] 09/25/2021   Procedure: ESOPHAGOGASTRODUODENOSCOPY (EGD) WITH PROPOFOL;  Surgeon: Milus Banister, MD;  Location: [facility];  Service: Gastroenterology;  Laterality: [name];  With NGT placement   Family History: History reviewed. No pertinent family history. Family Psychiatric  History: See previous Social History:  Social History   Substance and Sexual Activity  Alcohol Use Not Currently   Comment: Refuses to disclose how much     Social History   Substance and Sexual Activity  Drug Use No   Comment: Refuses to answer    Social History   Socioeconomic History   Marital status: Single    Spouse name: Not on file   Number of children: Not on file   Years of education: Not on file   Highest education level: Not on file  Occupational History   Not on file  Tobacco Use   Smoking status: Every Day    Packs/day: 2.00    Types: Cigarettes   Smokeless tobacco: Never  Substance and Sexual Activity   Alcohol use: Not Currently    Comment: Refuses to disclose how  much   Drug use: No    Comment: Refuses to answer   Sexual activity: Not Currently    Comment: refused to answer  Other Topics Concern   Not on file  Social History Narrative   Not on file   Social Determinants of Health   Financial Resource Strain: Not on file  Food Insecurity: No Food Insecurity (04/08/2022)   Hunger Vital Sign    Worried About Running Out of Food in the Last Year: Never true    Ran Out of Food in the Last Year: Never true  Transportation Needs: No Transportation Needs (04/08/2022)   PRAPARE - Hydrologist (Medical): No    Lack of Transportation (Non-Medical): No  Physical Activity: Not on file  Stress: Not on file  Social Connections: Not on file   Additional Social History:                         Sleep: Fair  Appetite:  Fair  Current Medications: Current Facility-Administered Medications  Medication Dose Route Frequency Provider Last Rate Last Admin   acetaminophen (TYLENOL) tablet 650 mg  650 mg Oral Q6H PRN Zairah Arista T, MD       alum & mag hydroxide-simeth (MAALOX/MYLANTA) 200-200-20 MG/5ML suspension 30 mL  30 mL Oral Q4H PRN Mindy Behnken T, MD       haloperidol (HALDOL) tablet 5 mg  5 mg Oral BID Nicholis Stepanek, [name], MD  5 mg at 06/15/22 2563   Or   haloperidol lactate (HALDOL) injection 5 mg  5 mg Intramuscular BID Kaliann Coryell T, MD       lithium carbonate capsule 600 mg  600 mg Oral QHS Jamesrobert Ohanesian T, MD   600 mg at 06/14/22 2158   LORazepam (ATIVAN) tablet 1 mg  1 mg Oral BID Araceli Arango T, MD   1 mg at 06/15/22 8937   Or   LORazepam (ATIVAN) injection 1 mg  1 mg Intramuscular BID Dynesha Woolen T, MD       magnesium hydroxide (MILK OF MAGNESIA) suspension 30 mL  30 mL Oral Daily PRN Jamarr Treinen T, MD       mirtazapine (REMERON) tablet 30 mg  30 mg Oral QHS Bayani Renteria T, MD   30 mg at 06/14/22 2158   multivitamin with minerals tablet 1 tablet  1 tablet Oral Daily Khali Albanese, [name], MD   1 tablet  at 06/15/22 0829   QUEtiapine (SEROQUEL) tablet 600 mg  600 mg Oral QHS Reggie Pile, MD   600 mg at 06/14/22 2157   thiamine (VITAMIN B1) tablet 100 mg  100 mg Oral Daily Alexio Sroka, [name], MD   100 mg at 06/15/22 3428    Lab Results: No results found for this or any previous visit (from the past 48 hour(s)).  Blood Alcohol level:  Lab Results  Component Value Date   ETH <10 03/29/2022   ETH <10 09/12/2021    Metabolic Disorder Labs: Lab Results  Component Value Date   HGBA1C 5.6 10/21/2021   MPG 114 10/21/2021   MPG 111 10/02/2021   Lab Results  Component Value Date   PROLACTIN 13.6 04/16/2015   Lab Results  Component Value Date   CHOL 189 10/21/2021   TRIG 52 10/21/2021   HDL 50 10/21/2021   CHOLHDL 3.8 10/21/2021   VLDL 10 10/21/2021   LDLCALC 129 (H) 10/21/2021   LDLCALC 93 10/01/2020    Physical Findings: AIMS: Facial and Oral Movements Muscles of Facial Expression: None, normal Lips and Perioral Area: None, normal Jaw: None, normal Tongue: None, normal,Extremity Movements Upper (arms, wrists, hands, fingers): None, normal Lower (legs, knees, ankles, toes): None, normal, Trunk Movements Neck, shoulders, hips: None, normal, Overall Severity Severity of abnormal movements (highest score from questions above): None, normal Incapacitation due to abnormal movements: None, normal Patient's awareness of abnormal movements (rate only patient's report): No Awareness, Dental Status Current problems with teeth and/or dentures?: No Does patient usually wear dentures?: No  CIWA:    COWS:     Musculoskeletal: Strength & Muscle Tone: within normal limits Gait & Station: normal Patient leans: [name]  Psychiatric Specialty Exam:  Presentation  General Appearance:  Disheveled  Eye Contact: Fair  Speech: Pressured  Speech Volume: Increased  Handedness: Right   Mood and Affect  Mood: Irritable; Labile; Angry  Affect: Blunt; Labile   Thought Process   Thought Processes: Disorganized; Irrevelant  Descriptions of Associations:Loose  Orientation:Partial  Thought Content:Illogical; Rumination; Scattered; Tangential; Delusions  History of Schizophrenia/Schizoaffective disorder:Yes  Duration of Psychotic Symptoms:Greater than six months  Hallucinations:No data recorded Ideas of Reference:Percusatory; Paranoia  Suicidal Thoughts:No data recorded Homicidal Thoughts:No data recorded  Sensorium  Memory: Immediate Fair; Recent Fair; Remote Fair  Judgment: Impaired  Insight: Poor; Lacking   Executive Functions  Concentration: Poor  Attention Span: Poor  Recall: Poor  Fund of Knowledge: Poor  Language: Poor   Psychomotor Activity  Psychomotor Activity:No data  recorded  Assets  Assets: Physical Health; Resilience; Housing; Social Support   Sleep  Sleep:No data recorded   Physical Exam: Physical Exam Vitals and nursing note reviewed.  Constitutional:      Appearance: Normal appearance.  HENT:     Head: Normocephalic and atraumatic.     Mouth/Throat:     Pharynx: Oropharynx is clear.  Eyes:     Pupils: Pupils are equal, round, and reactive to light.  Cardiovascular:     Rate and Rhythm: Normal rate and regular rhythm.  Pulmonary:     Effort: Pulmonary effort is normal.     Breath sounds: Normal breath sounds.  Abdominal:     General: Abdomen is flat.     Palpations: Abdomen is soft.  Musculoskeletal:        General: Normal range of motion.  Skin:    General: Skin is warm and dry.  Neurological:     General: No focal deficit present.     Mental Status: She is alert. Mental status is at baseline.  Psychiatric:        Attention and Perception: She is inattentive.        Mood and Affect: Mood normal. Affect is blunt.        Speech: Speech is delayed.        Behavior: Behavior is slowed.        Thought Content: Thought content is paranoid.    Review of Systems  Constitutional: Negative.    HENT: Negative.    Eyes: Negative.   Respiratory: Negative.    Cardiovascular: Negative.   Gastrointestinal: Negative.   Musculoskeletal: Negative.   Skin: Negative.   Neurological: Negative.   Psychiatric/Behavioral: Negative.     Blood pressure 108/68, pulse 63, temperature 97.7 F (36.5 C), temperature source Oral, resp. rate 18, height 5\' 4"  (1.626 m), weight 40.4 kg, SpO2 100 %. Body mass index is 15.28 kg/m.   Treatment Plan Summary: Plan no change to medication.  No change to plan.  Try every day to do some communication with the patient to establish rapport or get her to be more flexible about discharge planning largely to no avail.  Treatment team is working on finding some way to intervene to hopefully get her out of the hospital.  Alethia Berthold, MD 06/15/2022, 1:32 PM

## 2022-06-15 NOTE — Progress Notes (Signed)
Patient isolative to her room.  She is pleasant and cooperative. Med complaint with no complaints or issues. Denies having any psychotic symptoms.  Reports eating and sleeping appropriately. Q15 minute safety checks in place. Encouraged her to seek staff with any concerns.    C Butler-Nicholson, LPN

## 2022-06-15 NOTE — Progress Notes (Addendum)
Recreation Therapy Notes   Date: 06/15/2022   Time: 10:45 am      Location: Craft room    Behavioral response: [name]   Intervention Topic: Time Management   Discussion/Intervention: Patient refused to attend group.    Clinical Observations/Feedback:  Patient refused to attend group.    Zaiyden Strozier LRT/CTRS        Bentley Haralson 06/15/2022 11:51 AM

## 2022-06-16 DIAGNOSIS — F251 Schizoaffective disorder, depressive type: Secondary | ICD-10-CM | POA: Diagnosis not present

## 2022-06-16 NOTE — Group Note (Signed)
LCSW Group Therapy Note   Group Date: 06/16/2022 Start Time: 1300 End Time: 1400   Type of Therapy and Topic:  Group Therapy: Challenging Core Beliefs  Participation Level:  Did Not Attend  Description of Group:  Patients were educated about core beliefs and asked to identify one harmful core belief that they have. Patients were asked to explore from where those beliefs originate. Patients were asked to discuss how those beliefs make them feel and the resulting behaviors of those beliefs. They were then be asked if those beliefs are true and, if so, what evidence they have to support them. Lastly, group members were challenged to replace those negative core beliefs with helpful beliefs.   Therapeutic Goals:   1. Patient will identify harmful core beliefs and explore the origins of such beliefs. 2. Patient will identify feelings and behaviors that result from those core beliefs. 3. Patient will discuss whether such beliefs are true. 4.  Patient will replace harmful core beliefs with helpful ones.  Summary of Patient Progress:   Patient did not attend group despite encouraged participation.   Therapeutic Modalities: Cognitive Behavioral Therapy; Solution-Focused Therapy   Corky Crafts, Theresia Majors 06/16/2022  2:00 PM

## 2022-06-16 NOTE — Progress Notes (Signed)
Patient isolative to her room. She is pleasant and cooperative upon approach and does  engage well in conversation.  She is med compliant. She denies si hi avh. Support and encouragement offered as she continues to ask when she will be able to leave. Will continue to monitor. Q 15 min safety checks in place.,      C Butler-Nicholson, LPN

## 2022-06-16 NOTE — Plan of Care (Signed)
D: Patient alert and oriented. Patient denies pain. Patient denies anxiety and depression. Patient denies SI/HI/AVH.  Patient remains isolative to room with exception to coming out for meals and scheduled medication during shift.  A: Scheduled medications administered to patient, per MD orders.  Support and encouragement provided to patient.  Q15 minute safety checks maintained.   R: Patient compliant with medication administration and treatment plan. No adverse drug reactions noted. Patient remains safe on the unit at this time. Problem: Education: Goal: Knowledge of [facility] Education information/materials will improve Outcome: Progressing Goal: Verbalization of understanding the information provided will improve Outcome: Progressing   Problem: Health Behavior/Discharge Planning: Goal: Compliance with treatment plan for underlying cause of condition will improve Outcome: Progressing   Problem: Physical Regulation: Goal: Ability to maintain clinical measurements within normal limits will improve Outcome: Progressing   Problem: Safety: Goal: Periods of time without injury will increase Outcome: Progressing

## 2022-06-16 NOTE — Progress Notes (Signed)
[facility] MD Progress Note  06/16/2022 2:05 PM [name]  MRN:  [id number] Subjective: Follow-up 61 year old woman with schizoaffective disorder depressed type.  Continues to be withdrawn and very negativistic.  Gets agitated whenever her family is brought up.  Continues to be paranoid and unable to engage in lucid conversation. Principal Problem: Schizoaffective disorder, depressive type ([place]) Diagnosis: Principal Problem:   Schizoaffective disorder, depressive type ([place])  Total Time spent with patient: 30 minutes  Past Psychiatric History: Past history of recurrent severe depression and psychotic symptoms.  Chronic psychosis and paranoia.  Past Medical History:  Past Medical History:  Diagnosis Date   Anemia 10/02/2021   Non compliance w medication regimen    Schizophrenia [facility])     Past Surgical History:  Procedure Laterality Date   BIOPSY  09/25/2021   Procedure: BIOPSY;  Surgeon: Milus Banister, MD;  Location: [facility];  Service: Gastroenterology;;   ESOPHAGOGASTRODUODENOSCOPY (EGD) WITH PROPOFOL [name] 09/25/2021   Procedure: ESOPHAGOGASTRODUODENOSCOPY (EGD) WITH PROPOFOL;  Surgeon: Milus Banister, MD;  Location: [facility];  Service: Gastroenterology;  Laterality: [name];  With NGT placement   Family History: History reviewed. No pertinent family history. Family Psychiatric  History: See previous Social History:  Social History   Substance and Sexual Activity  Alcohol Use Not Currently   Comment: Refuses to disclose how much     Social History   Substance and Sexual Activity  Drug Use No   Comment: Refuses to answer    Social History   Socioeconomic History   Marital status: Single    Spouse name: Not on file   Number of children: Not on file   Years of education: Not on file   Highest education level: Not on file  Occupational History   Not on file  Tobacco Use   Smoking status: Every Day    Packs/day: 2.00    Types: Cigarettes   Smokeless tobacco: Never   Substance and Sexual Activity   Alcohol use: Not Currently    Comment: Refuses to disclose how much   Drug use: No    Comment: Refuses to answer   Sexual activity: Not Currently    Comment: refused to answer  Other Topics Concern   Not on file  Social History Narrative   Not on file   Social Determinants of Health   Financial Resource Strain: Not on file  Food Insecurity: No Food Insecurity (04/08/2022)   Hunger Vital Sign    Worried About Running Out of Food in the Last Year: Never true    Ran Out of Food in the Last Year: Never true  Transportation Needs: No Transportation Needs (04/08/2022)   PRAPARE - Hydrologist (Medical): No    Lack of Transportation (Non-Medical): No  Physical Activity: Not on file  Stress: Not on file  Social Connections: Not on file   Additional Social History:                         Sleep: Fair  Appetite:  Fair  Current Medications: Current Facility-Administered Medications  Medication Dose Route Frequency Provider Last Rate Last Admin   acetaminophen (TYLENOL) tablet 650 mg  650 mg Oral Q6H PRN Jamyra Zweig T, MD       alum & mag hydroxide-simeth (MAALOX/MYLANTA) 200-200-20 MG/5ML suspension 30 mL  30 mL Oral Q4H PRN Graycee Greeson T, MD       haloperidol (HALDOL) tablet 5 mg  5  mg Oral BID Lener Ventresca, [name], MD   5 mg at 06/16/22 1027   Or   haloperidol lactate (HALDOL) injection 5 mg  5 mg Intramuscular BID Lenette Rau T, MD       lithium carbonate capsule 600 mg  600 mg Oral QHS Charese Abundis T, MD   600 mg at 06/15/22 2143   LORazepam (ATIVAN) tablet 1 mg  1 mg Oral BID Ulani Degrasse T, MD   1 mg at 06/16/22 2536   Or   LORazepam (ATIVAN) injection 1 mg  1 mg Intramuscular BID Kanylah Muench T, MD       magnesium hydroxide (MILK OF MAGNESIA) suspension 30 mL  30 mL Oral Daily PRN Lerline Valdivia T, MD       mirtazapine (REMERON) tablet 30 mg  30 mg Oral QHS Nairi Oswald T, MD   30 mg at 06/15/22 2143    multivitamin with minerals tablet 1 tablet  1 tablet Oral Daily Minervia Osso, [name], MD   1 tablet at 06/16/22 0819   QUEtiapine (SEROQUEL) tablet 600 mg  600 mg Oral QHS Reggie Pile, MD   600 mg at 06/15/22 2143   thiamine (VITAMIN B1) tablet 100 mg  100 mg Oral Daily Rolland Steinert, [name], MD   100 mg at 06/16/22 6440    Lab Results: No results found for this or any previous visit (from the past 48 hour(s)).  Blood Alcohol level:  Lab Results  Component Value Date   ETH <10 03/29/2022   ETH <10 09/12/2021    Metabolic Disorder Labs: Lab Results  Component Value Date   HGBA1C 5.6 10/21/2021   MPG 114 10/21/2021   MPG 111 10/02/2021   Lab Results  Component Value Date   PROLACTIN 13.6 04/16/2015   Lab Results  Component Value Date   CHOL 189 10/21/2021   TRIG 52 10/21/2021   HDL 50 10/21/2021   CHOLHDL 3.8 10/21/2021   VLDL 10 10/21/2021   LDLCALC 129 (H) 10/21/2021   LDLCALC 93 10/01/2020    Physical Findings: AIMS: Facial and Oral Movements Muscles of Facial Expression: None, normal Lips and Perioral Area: None, normal Jaw: None, normal Tongue: None, normal,Extremity Movements Upper (arms, wrists, hands, fingers): None, normal Lower (legs, knees, ankles, toes): None, normal, Trunk Movements Neck, shoulders, hips: None, normal, Overall Severity Severity of abnormal movements (highest score from questions above): None, normal Incapacitation due to abnormal movements: None, normal Patient's awareness of abnormal movements (rate only patient's report): No Awareness, Dental Status Current problems with teeth and/or dentures?: No Does patient usually wear dentures?: No  CIWA:    COWS:     Musculoskeletal: Strength & Muscle Tone: within normal limits Gait & Station: normal Patient leans: [name]  Psychiatric Specialty Exam:  Presentation  General Appearance:  Disheveled  Eye Contact: Fair  Speech: Pressured  Speech  Volume: Increased  Handedness: Right   Mood and Affect  Mood: Irritable; Labile; Angry  Affect: Blunt; Labile   Thought Process  Thought Processes: Disorganized; Irrevelant  Descriptions of Associations:Loose  Orientation:Partial  Thought Content:Illogical; Rumination; Scattered; Tangential; Delusions  History of Schizophrenia/Schizoaffective disorder:Yes  Duration of Psychotic Symptoms:Greater than six months  Hallucinations:No data recorded Ideas of Reference:Percusatory; Paranoia  Suicidal Thoughts:No data recorded Homicidal Thoughts:No data recorded  Sensorium  Memory: Immediate Fair; Recent Fair; Remote Fair  Judgment: Impaired  Insight: Poor; Lacking   Executive Functions  Concentration: Poor  Attention Span: Poor  Recall: Poor  Fund of Knowledge: Poor  Language:  Poor   Psychomotor Activity  Psychomotor Activity:No data recorded  Assets  Assets: Physical Health; Resilience; Housing; Social Support   Sleep  Sleep:No data recorded   Physical Exam: Physical Exam Vitals reviewed.  Constitutional:      Appearance: Normal appearance.  HENT:     Head: Normocephalic and atraumatic.     Mouth/Throat:     Pharynx: Oropharynx is clear.  Eyes:     Pupils: Pupils are equal, round, and reactive to light.  Cardiovascular:     Rate and Rhythm: Normal rate and regular rhythm.  Pulmonary:     Effort: Pulmonary effort is normal.     Breath sounds: Normal breath sounds.  Abdominal:     General: Abdomen is flat.     Palpations: Abdomen is soft.  Musculoskeletal:        General: Normal range of motion.  Skin:    General: Skin is warm and dry.  Neurological:     General: No focal deficit present.     Mental Status: She is alert. Mental status is at baseline.  Psychiatric:        Attention and Perception: She is inattentive.        Mood and Affect: Affect is blunt and inappropriate.        Speech: Speech is tangential.         Behavior: Behavior is withdrawn.        Thought Content: Thought content is paranoid.        Cognition and Memory: Cognition is impaired.    Review of Systems  Constitutional: Negative.   HENT: Negative.    Eyes: Negative.   Respiratory: Negative.    Cardiovascular: Negative.   Gastrointestinal: Negative.   Musculoskeletal: Negative.   Skin: Negative.   Neurological: Negative.   Psychiatric/Behavioral: Negative.     Blood pressure (!) 109/58, pulse (!) 57, temperature 98.3 F (36.8 C), temperature source Oral, resp. rate 18, height 5\' 4"  (1.626 m), weight 40.4 kg, SpO2 100 %. Body mass index is 15.28 kg/m.   Treatment Plan Summary: Medication management and Plan patient is now having a trial of Seroquel for psychosis so far not showing improvement.  Unclear if she would be a good clozapine candidate.  Remains psychotic and unmanageable we cannot yet discharge her her family is refusing to take her home.  , MD 06/16/2022, 2:05 PM

## 2022-06-16 NOTE — Plan of Care (Signed)
  Problem: Education: Goal: Knowledge of General Education information will improve Description: Including pain rating scale, medication(s)/side effects and non-pharmacologic comfort measures Outcome: Progressing   Problem: Health Behavior/Discharge Planning: Goal: Ability to manage health-related needs will improve Outcome: Progressing   Problem: Clinical Measurements: Goal: Ability to maintain clinical measurements within normal limits will improve Outcome: Progressing Goal: Will remain free from infection Outcome: Progressing Goal: Diagnostic test results will improve Outcome: Progressing Goal: Respiratory complications will improve Outcome: Progressing Goal: Cardiovascular complication will be avoided Outcome: Progressing   Problem: Safety: Goal: Periods of time without injury will increase Outcome: Progressing   Problem: Physical Regulation: Goal: Ability to maintain clinical measurements within normal limits will improve Outcome: Progressing   Problem: Health Behavior/Discharge Planning: Goal: Identification of resources available to assist in meeting health care needs will improve Outcome: Progressing Goal: Compliance with treatment plan for underlying cause of condition will improve Outcome: Progressing   Problem: Coping: Goal: Ability to verbalize frustrations and anger appropriately will improve Outcome: Progressing Goal: Ability to demonstrate self-control will improve Outcome: Progressing   

## 2022-06-16 NOTE — Progress Notes (Signed)
Recreation Therapy Notes  Date: 06/16/2022  Time: 2:05pm     Location: Court yard   Behavioral response: [name]  Intervention Topic: Leisure   Discussion/Intervention: Patient refused to attend group.   Clinical Observations/Feedback:  Patient refused to attend group.    Nikkia Devoss LRT/CTRS        [name] 06/16/2022 3:05 PM 

## 2022-06-16 NOTE — Plan of Care (Signed)
Pt denies anxiety/depression at this time. Pt denies SI/HI/AVH or pain at this time. Pt is calm and cooperative. Pt is medication compliant. Pt provided with support and encouragement. Pt monitored q15 minutes for safety per unit policy. Plan of care ongoing.   Problem: Coping: Goal: Level of anxiety will decrease Outcome: Progressing   Problem: Coping: Goal: Will verbalize feelings Outcome: Not Progressing

## 2022-06-17 DIAGNOSIS — F251 Schizoaffective disorder, depressive type: Secondary | ICD-10-CM | POA: Diagnosis not present

## 2022-06-17 NOTE — Progress Notes (Signed)
Recreation Therapy Notes  Date: 06/17/2022   Time: 10:50 am   Location: Craft room    Behavioral response: [name]   Intervention Topic: Values    Discussion/Intervention: Patient refused to attend group.    Clinical Observations/Feedback:  Patient refused to attend group.    Amylah Will LRT/CTRS          Deairra Halleck 06/17/2022 11:12 AM

## 2022-06-17 NOTE — [facility] Group Notes (Signed)
[facility] Group Notes:  (Nursing/MHT/Case Management/Adjunct)  Date:  06/17/2022  Time:  9:43 AM  Type of Therapy:   Community Meeting  Participation Level:  Did Not Attend   Lynelle Smoke [facility] 06/17/2022, 9:43 AM

## 2022-06-17 NOTE — Group Note (Signed)
[facility] LCSW Group Therapy Note   Group Date: 06/17/2022 Start Time: 1310 End Time: 1420   Type of Therapy/Topic:  Group Therapy:  Emotion Regulation  Participation Level:  Did Not Attend    Description of Group:    The purpose of this group is to assist patients in learning to regulate negative emotions and experience positive emotions. Patients will be guided to discuss ways in which they have been vulnerable to their negative emotions. These vulnerabilities will be juxtaposed with experiences of positive emotions or situations, and patients challenged to use positive emotions to combat negative ones. Special emphasis will be placed on coping with negative emotions in conflict situations, and patients will process healthy conflict resolution skills.  Therapeutic Goals: Patient will identify two positive emotions or experiences to reflect on in order to balance out negative emotions:  Patient will label two or more emotions that they find the most difficult to experience:  Patient will be able to demonstrate positive conflict resolution skills through discussion or role plays:   Summary of Patient Progress: X   Therapeutic Modalities:   Cognitive Behavioral Therapy Feelings Identification Dialectical Behavioral Therapy   Quinterious Walraven R Fern Asmar, LCSW 

## 2022-06-17 NOTE — Plan of Care (Signed)
  Problem: Nutrition: Goal: Adequate nutrition will be maintained Outcome: Progressing   Problem: Coping: Goal: Level of anxiety will decrease Outcome: Progressing   

## 2022-06-17 NOTE — Progress Notes (Signed)
[facility] MD Progress Note  06/17/2022 3:31 PM [name]  MRN:  [id number] Subjective: Patient seen for follow-up.  No change to presentation.  I tried to discuss medication and treatment with her.  My hope would been that perhaps we could try clozapine for this patient who has been unresponsive to several antipsychotics.  As soon as I began the discussion she became irritated and started shouting at me with her usual paranoid delusions.  When I came to the part about getting a blood draw she loudly insisted she would not allow anybody to draw her blood.  It seemed like there was no way forward in the conversation. Principal Problem: Schizoaffective disorder, depressive type ([place]) Diagnosis: Principal Problem:   Schizoaffective disorder, depressive type ([place])  Total Time spent with patient: 30 minutes  Past Psychiatric History: Past history of schizophrenia or schizoaffective disorder  Past Medical History:  Past Medical History:  Diagnosis Date   Anemia 10/02/2021   Non compliance w medication regimen    Schizophrenia [facility])     Past Surgical History:  Procedure Laterality Date   BIOPSY  09/25/2021   Procedure: BIOPSY;  Surgeon: Milus Banister, MD;  Location: [facility];  Service: Gastroenterology;;   ESOPHAGOGASTRODUODENOSCOPY (EGD) WITH PROPOFOL [name] 09/25/2021   Procedure: ESOPHAGOGASTRODUODENOSCOPY (EGD) WITH PROPOFOL;  Surgeon: Milus Banister, MD;  Location: [facility];  Service: Gastroenterology;  Laterality: [name];  With NGT placement   Family History: History reviewed. No pertinent family history. Family Psychiatric  History: See previous Social History:  Social History   Substance and Sexual Activity  Alcohol Use Not Currently   Comment: Refuses to disclose how much     Social History   Substance and Sexual Activity  Drug Use No   Comment: Refuses to answer    Social History   Socioeconomic History   Marital status: Single    Spouse name: Not on file   Number of  children: Not on file   Years of education: Not on file   Highest education level: Not on file  Occupational History   Not on file  Tobacco Use   Smoking status: Every Day    Packs/day: 2.00    Types: Cigarettes   Smokeless tobacco: Never  Substance and Sexual Activity   Alcohol use: Not Currently    Comment: Refuses to disclose how much   Drug use: No    Comment: Refuses to answer   Sexual activity: Not Currently    Comment: refused to answer  Other Topics Concern   Not on file  Social History Narrative   Not on file   Social Determinants of Health   Financial Resource Strain: Not on file  Food Insecurity: No Food Insecurity (04/08/2022)   Hunger Vital Sign    Worried About Running Out of Food in the Last Year: Never true    Ran Out of Food in the Last Year: Never true  Transportation Needs: No Transportation Needs (04/08/2022)   PRAPARE - Hydrologist (Medical): No    Lack of Transportation (Non-Medical): No  Physical Activity: Not on file  Stress: Not on file  Social Connections: Not on file   Additional Social History:                         Sleep: Fair  Appetite:  Fair  Current Medications: Current Facility-Administered Medications  Medication Dose Route Frequency Provider Last Rate Last Admin   acetaminophen (  TYLENOL) tablet 650 mg  650 mg Oral Q6H PRN Marshal Schrecengost T, MD       alum & mag hydroxide-simeth (MAALOX/MYLANTA) 200-200-20 MG/5ML suspension 30 mL  30 mL Oral Q4H PRN Gedalia Mcmillon T, MD       haloperidol (HALDOL) tablet 5 mg  5 mg Oral BID Lamiya Naas, [name], MD   5 mg at 06/17/22 [id number]   Or   haloperidol lactate (HALDOL) injection 5 mg  5 mg Intramuscular BID Breslin Hemann T, MD       lithium carbonate capsule 600 mg  600 mg Oral QHS Smera Guyette T, MD   600 mg at 06/16/22 2106   LORazepam (ATIVAN) tablet 1 mg  1 mg Oral BID Elayjah Chaney T, MD   1 mg at 06/17/22 [id number]   Or   LORazepam (ATIVAN) injection 1 mg  1  mg Intramuscular BID Sholonda Jobst T, MD       magnesium hydroxide (MILK OF MAGNESIA) suspension 30 mL  30 mL Oral Daily PRN Evey Mcmahan T, MD       mirtazapine (REMERON) tablet 30 mg  30 mg Oral QHS Soo Steelman T, MD   30 mg at 06/16/22 2106   multivitamin with minerals tablet 1 tablet  1 tablet Oral Daily Breyanna Valera, [name], MD   1 tablet at 06/17/22 0811   QUEtiapine (SEROQUEL) tablet 600 mg  600 mg Oral QHS Rulon Sera, MD   600 mg at 06/16/22 2106   thiamine (VITAMIN B1) tablet 100 mg  100 mg Oral Daily Darran Gabay, [name], MD   100 mg at 06/17/22 [id number]    Lab Results: No results found for this or any previous visit (from the past 48 hour(s)).  Blood Alcohol level:  Lab Results  Component Value Date   ETH <10 03/29/2022   ETH <10 [id number]    Metabolic Disorder Labs: Lab Results  Component Value Date   HGBA1C 5.6 10/21/2021   MPG 114 10/21/2021   MPG 111 10/02/2021   Lab Results  Component Value Date   PROLACTIN 13.6 04/16/2015   Lab Results  Component Value Date   CHOL 189 10/21/2021   TRIG 52 10/21/2021   HDL 50 10/21/2021   CHOLHDL 3.8 10/21/2021   VLDL 10 10/21/2021   LDLCALC 129 (H) 10/21/2021   LDLCALC 93 10/01/2020    Physical Findings: AIMS: Facial and Oral Movements Muscles of Facial Expression: None, normal Lips and Perioral Area: None, normal Jaw: None, normal Tongue: None, normal,Extremity Movements Upper (arms, wrists, hands, fingers): None, normal Lower (legs, knees, ankles, toes): None, normal, Trunk Movements Neck, shoulders, hips: None, normal, Overall Severity Severity of abnormal movements (highest score from questions above): None, normal Incapacitation due to abnormal movements: None, normal Patient's awareness of abnormal movements (rate only patient's report): No Awareness, Dental Status Current problems with teeth and/or dentures?: No Does patient usually wear dentures?: No  CIWA:    COWS:     Musculoskeletal: Strength & Muscle Tone:  within normal limits Gait & Station: normal Patient leans: [name]  Psychiatric Specialty Exam:  Presentation  General Appearance:  Disheveled  Eye Contact: Fair  Speech: Pressured  Speech Volume: Increased  Handedness: Right   Mood and Affect  Mood: Irritable; Labile; Angry  Affect: Blunt; Labile   Thought Process  Thought Processes: Disorganized; Irrevelant  Descriptions of Associations:Loose  Orientation:Partial  Thought Content:Illogical; Rumination; Scattered; Tangential; Delusions  History of Schizophrenia/Schizoaffective disorder:Yes  Duration of Psychotic Symptoms:Greater than six months  Hallucinations:No  data recorded Ideas of Reference:Percusatory; Paranoia  Suicidal Thoughts:No data recorded Homicidal Thoughts:No data recorded  Sensorium  Memory: Immediate Fair; Recent Fair; Remote Fair  Judgment: Impaired  Insight: Poor; Lacking   Executive Functions  Concentration: Poor  Attention Span: Poor  Recall: Poor  Fund of Knowledge: Poor  Language: Poor   Psychomotor Activity  Psychomotor Activity:No data recorded  Assets  Assets: Physical Health; Resilience; Housing; Social Support   Sleep  Sleep:No data recorded   Physical Exam: Physical Exam Vitals and nursing note reviewed.  Constitutional:      Appearance: Normal appearance.  HENT:     Head: Normocephalic and atraumatic.     Mouth/Throat:     Pharynx: Oropharynx is clear.  Eyes:     Pupils: Pupils are equal, round, and reactive to light.  Cardiovascular:     Rate and Rhythm: Normal rate and regular rhythm.  Pulmonary:     Effort: Pulmonary effort is normal.     Breath sounds: Normal breath sounds.  Abdominal:     General: Abdomen is flat.     Palpations: Abdomen is soft.  Musculoskeletal:        General: Normal range of motion.  Skin:    General: Skin is warm and dry.  Neurological:     General: No focal deficit present.     Mental Status: She  is alert. Mental status is at baseline.  Psychiatric:        Attention and Perception: She is inattentive.        Mood and Affect: Mood normal. Affect is labile and blunt.        Speech: Speech is tangential.        Behavior: Behavior is agitated.        Thought Content: Thought content is paranoid.        Cognition and Memory: Cognition is impaired.        Judgment: Judgment is inappropriate.    Review of Systems  Constitutional: Negative.   HENT: Negative.    Eyes: Negative.   Respiratory: Negative.    Cardiovascular: Negative.   Gastrointestinal: Negative.   Musculoskeletal: Negative.   Skin: Negative.   Neurological: Negative.    Blood pressure 117/67, pulse (!) 57, temperature 98 F (36.7 C), temperature source Oral, resp. rate 18, height 5\' 4"  (1.626 m), weight 40.4 kg, SpO2 100 %. Body mass index is 15.28 kg/m.   Treatment Plan Summary: Medication management and Plan does not seem likely that we will be able to do clozapine with this patient who is fixed on refusing blood draws.  Continue to see if there is any improvement to antipsychotics with time.  We remained stuck as far as any discharge plan.  , MD 06/17/2022, 3:31 PM

## 2022-06-17 NOTE — Progress Notes (Signed)
Pt denies SI/HI/AVH and verbally agrees to approach staff if these become apparent or before harming themselves/others. Rates depression 0/10. Rates anxiety 0/10. Rates pain 0/10.   Scheduled medications administered to pt, per MD orders. RN provided support and encouragement to pt. Q15 min safety checks implemented and continued. Pt safe on the unit. RN will continue to monitor and intervene as needed.  06/17/22 0900  Psych Admission Type (Psych Patients Only)  Admission Status Voluntary  Psychosocial Assessment  Patient Complaints None  Eye Contact Fair  Facial Expression Flat  Affect Appropriate to circumstance  Speech Logical/coherent  Interaction Minimal;Isolative  Motor Activity Slow  Appearance/Hygiene In scrubs;Poor hygiene  Behavior Characteristics Cooperative;Appropriate to situation;Calm  Mood Depressed  Thought Process  Coherency WDL  Content WDL  Delusions None reported or observed  Perception WDL  Hallucination None reported or observed  Judgment Impaired  Confusion None  Danger to Self  Current suicidal ideation? Denies  Danger to Others  Danger to Others None reported or observed

## 2022-06-18 DIAGNOSIS — F251 Schizoaffective disorder, depressive type: Secondary | ICD-10-CM | POA: Diagnosis not present

## 2022-06-18 NOTE — Group Note (Signed)
[facility] LCSW Group Therapy Note   Group Date: 06/18/2022 Start Time: 1300 End Time: 1400   Type of Therapy/Topic:  Group Therapy:  Balance in Life  Participation Level:  Did Not Attend   Description of Group:    This group will address the concept of balance and how it feels and looks when one is unbalanced. Patients will be encouraged to process areas in their lives that are out of balance, and identify reasons for remaining unbalanced. Facilitators will guide patients utilizing problem- solving interventions to address and correct the stressor making their life unbalanced. Understanding and applying boundaries will be explored and addressed for obtaining  and maintaining a balanced life. Patients will be encouraged to explore ways to assertively make their unbalanced needs known to significant others in their lives, using other group members and facilitator for support and feedback.  Therapeutic Goals: Patient will identify two or more emotions or situations they have that consume much of in their lives. Patient will identify signs/triggers that life has become out of balance:  Patient will identify two ways to set boundaries in order to achieve balance in their lives:  Patient will demonstrate ability to communicate their needs through discussion and/or role plays  Summary of Patient Progress: Patient declined to attend group despite encouragement from this CSW.   Therapeutic Modalities:   Cognitive Behavioral Therapy Solution-Focused Therapy Assertiveness Training   Ryan Ogborn J Chonte Ricke, LCSW 

## 2022-06-18 NOTE — Plan of Care (Signed)
  Problem: Role Relationship: Goal: Ability to interact with others will improve Outcome: Not Progressing

## 2022-06-18 NOTE — Progress Notes (Signed)
[facility] MD Progress Note  06/18/2022 1:25 PM [name]  MRN:  [id number] Subjective: Follow-up 61 year old woman with schizoaffective disorder.  Tried to speak with her today and as usual it is almost impossible to have a lucid conversation.  She stays in her room with the lights off sitting in a corner and when approached gets disorganized and starts talking nonsense and pacing around.  Cannot really reply to even benign questions.  Eating okay.  No sign of acute new physical distress. Principal Problem: Schizoaffective disorder, depressive type (HCC) Diagnosis: Principal Problem:   Schizoaffective disorder, depressive type (HCC)  Total Time spent with patient: 30 minutes  Past Psychiatric History: Past history of schizoaffective disorder or schizophrenia  Past Medical History:  Past Medical History:  Diagnosis Date   Anemia 10/02/2021   Non compliance w medication regimen    Schizophrenia [facility])     Past Surgical History:  Procedure Laterality Date   BIOPSY  09/25/2021   Procedure: BIOPSY;  Surgeon: Rachael Fee, MD;  Location: [facility];  Service: Gastroenterology;;   ESOPHAGOGASTRODUODENOSCOPY (EGD) WITH PROPOFOL [name] 09/25/2021   Procedure: ESOPHAGOGASTRODUODENOSCOPY (EGD) WITH PROPOFOL;  Surgeon: Rachael Fee, MD;  Location: [facility];  Service: Gastroenterology;  Laterality: [name];  With NGT placement   Family History: History reviewed. No pertinent family history. Family Psychiatric  History: See previous Social History:  Social History   Substance and Sexual Activity  Alcohol Use Not Currently   Comment: Refuses to disclose how much     Social History   Substance and Sexual Activity  Drug Use No   Comment: Refuses to answer    Social History   Socioeconomic History   Marital status: Single    Spouse name: Not on file   Number of children: Not on file   Years of education: Not on file   Highest education level: Not on file  Occupational History   Not on  file  Tobacco Use   Smoking status: Every Day    Packs/day: 2.00    Types: Cigarettes   Smokeless tobacco: Never  Substance and Sexual Activity   Alcohol use: Not Currently    Comment: Refuses to disclose how much   Drug use: No    Comment: Refuses to answer   Sexual activity: Not Currently    Comment: refused to answer  Other Topics Concern   Not on file  Social History Narrative   Not on file   Social Determinants of Health   Financial Resource Strain: Not on file  Food Insecurity: No Food Insecurity (04/08/2022)   Hunger Vital Sign    Worried About Running Out of Food in the Last Year: Never true    Ran Out of Food in the Last Year: Never true  Transportation Needs: No Transportation Needs (04/08/2022)   PRAPARE - Administrator, Civil Service (Medical): No    Lack of Transportation (Non-Medical): No  Physical Activity: Not on file  Stress: Not on file  Social Connections: Not on file   Additional Social History:                         Sleep: Fair  Appetite:  Poor  Current Medications: Current Facility-Administered Medications  Medication Dose Route Frequency Provider Last Rate Last Admin   acetaminophen (TYLENOL) tablet 650 mg  650 mg Oral Q6H PRN Thomasa Heidler, [name], MD       alum & mag hydroxide-simeth (MAALOX/MYLANTA) 200-200-20 MG/5ML  suspension 30 mL  30 mL Oral Q4H PRN Missie Gehrig T, MD       haloperidol (HALDOL) tablet 5 mg  5 mg Oral BID Xaine Sansom T, MD   5 mg at 06/18/22 0109   Or   haloperidol lactate (HALDOL) injection 5 mg  5 mg Intramuscular BID Deniz Eskridge T, MD       lithium carbonate capsule 600 mg  600 mg Oral QHS Anjali Manzella T, MD   600 mg at 06/17/22 2109   LORazepam (ATIVAN) tablet 1 mg  1 mg Oral BID Erza Mothershead T, MD   1 mg at 06/18/22 3235   Or   LORazepam (ATIVAN) injection 1 mg  1 mg Intramuscular BID Etola Mull T, MD       magnesium hydroxide (MILK OF MAGNESIA) suspension 30 mL  30 mL Oral Daily PRN  Chessa Barrasso T, MD       mirtazapine (REMERON) tablet 30 mg  30 mg Oral QHS Sherin Murdoch T, MD   30 mg at 06/17/22 2109   multivitamin with minerals tablet 1 tablet  1 tablet Oral Daily Simaya Lumadue, [name], MD   1 tablet at 06/18/22 0810   QUEtiapine (SEROQUEL) tablet 600 mg  600 mg Oral QHS Reggie Pile, MD   600 mg at 06/17/22 2109   thiamine (VITAMIN B1) tablet 100 mg  100 mg Oral Daily Marshell Dilauro, [name], MD   100 mg at 06/18/22 5732    Lab Results: No results found for this or any previous visit (from the past 48 hour(s)).  Blood Alcohol level:  Lab Results  Component Value Date   ETH <10 03/29/2022   ETH <10 09/12/2021    Metabolic Disorder Labs: Lab Results  Component Value Date   HGBA1C 5.6 10/21/2021   MPG 114 10/21/2021   MPG 111 10/02/2021   Lab Results  Component Value Date   PROLACTIN 13.6 04/16/2015   Lab Results  Component Value Date   CHOL 189 10/21/2021   TRIG 52 10/21/2021   HDL 50 10/21/2021   CHOLHDL 3.8 10/21/2021   VLDL 10 10/21/2021   LDLCALC 129 (H) 10/21/2021   LDLCALC 93 10/01/2020    Physical Findings: AIMS: Facial and Oral Movements Muscles of Facial Expression: None, normal Lips and Perioral Area: None, normal Jaw: None, normal Tongue: None, normal,Extremity Movements Upper (arms, wrists, hands, fingers): None, normal Lower (legs, knees, ankles, toes): None, normal, Trunk Movements Neck, shoulders, hips: None, normal, Overall Severity Severity of abnormal movements (highest score from questions above): None, normal Incapacitation due to abnormal movements: None, normal Patient's awareness of abnormal movements (rate only patient's report): No Awareness, Dental Status Current problems with teeth and/or dentures?: No Does patient usually wear dentures?: No  CIWA:    COWS:     Musculoskeletal: Strength & Muscle Tone: within normal limits Gait & Station: normal Patient leans: [name]  Psychiatric Specialty Exam:  Presentation  General  Appearance:  Disheveled  Eye Contact: Fair  Speech: Pressured  Speech Volume: Increased  Handedness: Right   Mood and Affect  Mood: Irritable; Labile; Angry  Affect: Blunt; Labile   Thought Process  Thought Processes: Disorganized; Irrevelant  Descriptions of Associations:Loose  Orientation:Partial  Thought Content:Illogical; Rumination; Scattered; Tangential; Delusions  History of Schizophrenia/Schizoaffective disorder:Yes  Duration of Psychotic Symptoms:Greater than six months  Hallucinations:No data recorded Ideas of Reference:Percusatory; Paranoia  Suicidal Thoughts:No data recorded Homicidal Thoughts:No data recorded  Sensorium  Memory: Immediate Fair; Recent Fair; Remote Fair  Judgment:  Impaired  Insight: Poor; Lacking   Executive Functions  Concentration: Poor  Attention Span: Poor  Recall: Poor  Fund of Knowledge: Poor  Language: Poor   Psychomotor Activity  Psychomotor Activity:No data recorded  Assets  Assets: Physical Health; Resilience; Housing; Social Support   Sleep  Sleep:No data recorded   Physical Exam: Physical Exam Vitals and nursing note reviewed.  Constitutional:      Appearance: Normal appearance.  HENT:     Head: Normocephalic and atraumatic.     Mouth/Throat:     Pharynx: Oropharynx is clear.  Eyes:     Pupils: Pupils are equal, round, and reactive to light.  Cardiovascular:     Rate and Rhythm: Normal rate and regular rhythm.  Pulmonary:     Effort: Pulmonary effort is normal.     Breath sounds: Normal breath sounds.  Abdominal:     General: Abdomen is flat.     Palpations: Abdomen is soft.  Musculoskeletal:        General: Normal range of motion.  Skin:    General: Skin is warm and dry.  Neurological:     General: No focal deficit present.     Mental Status: She is alert. Mental status is at baseline.  Psychiatric:        Attention and Perception: She is inattentive.        Mood  and Affect: Mood normal. Affect is blunt.        Speech: Speech is delayed.        Behavior: Behavior is slowed.        Thought Content: Thought content is paranoid and delusional.    Review of Systems  Unable to perform ROS: Psychiatric disorder   Blood pressure 111/66, pulse (!) 56, temperature 97.8 F (36.6 C), temperature source Oral, resp. rate 16, height 5\' 4"  (1.626 m), weight 40.4 kg, SpO2 100 %. Body mass index is 15.28 kg/m.   Treatment Plan Summary: Medication management and Plan I have been trying to suggest of the patient for days that we consider starting her on clozapine.  As mentioned above it is basically impossible to have a lucid conversation with this patient as she is so preoccupied by her own paranoid delusions.  At this point her illness is a complete barrier to discharge and I think it is justified depressed ahead to try and get her optimal treatment.  I am going to order a CBC with differential today.  If we can get that done we will consider starting clozapine at night soon afterwards.  , MD 06/18/2022, 1:25 PM

## 2022-06-18 NOTE — Progress Notes (Signed)
Pt denies SI/HI/AVH and verbally agrees to approach staff if these become apparent or before harming themselves/others. Rates depression 0/10. Rates anxiety 0/10. Rates pain 0/10. Pt has been in her room for most of the day but has been pacing here and there. Scheduled medications administered to pt, per MD orders. RN provided support and encouragement to pt. Q15 min safety checks implemented and continued. Pt safe on the unit. RN will continue to monitor and intervene as needed.  06/18/22 0810  Psych Admission Type (Psych Patients Only)  Admission Status Voluntary  Psychosocial Assessment  Patient Complaints None  Eye Contact Fair  Facial Expression Flat  Affect Flat  Speech Logical/coherent  Interaction Minimal;Isolative  Motor Activity Slow  Appearance/Hygiene Improved;In scrubs  Behavior Characteristics Cooperative;Appropriate to situation;Calm  Mood Sad;Pleasant  Thought Process  Coherency WDL  Content WDL  Delusions None reported or observed  Perception WDL  Hallucination None reported or observed  Judgment Limited  Confusion None  Danger to Self  Current suicidal ideation? Denies  Danger to Others  Danger to Others None reported or observed

## 2022-06-18 NOTE — Plan of Care (Signed)
  Problem: Education: Goal: Knowledge of General Education information will improve Description Including pain rating scale, medication(s)/side effects and non-pharmacologic comfort measures Outcome: Progressing   

## 2022-06-18 NOTE — Progress Notes (Signed)
Recreation Therapy Notes   Date: 06/18/2022  Time: 10:45 am     Location: Craft room   Behavioral response: [name]   Intervention Topic: Stress Management   Discussion/Intervention: Patient refused to attend group.   Clinical Observations/Feedback:  Patient refused to attend group.    Merrik Puebla LRT/CTRS        Mane Consolo 06/18/2022 12:05 PM

## 2022-06-18 NOTE — Plan of Care (Signed)
Pt denies anxiety/depression at this time. Pt denies SI/HI/AVH or pain at this time. Pt is calm and cooperative. Pt is medication compliant. Pt provided with support and encouragement. Pt monitored q15 minutes for safety per unit policy. Plan of care ongoing.   Problem: Nutrition: Goal: Adequate nutrition will be maintained Outcome: Progressing   Problem: Elimination: Goal: Will not experience complications related to bowel motility Outcome: Progressing

## 2022-06-18 NOTE — Plan of Care (Signed)
Pt denies anxiety/depression at this time. Pt denies SI/HI/AVH or pain at this time. Pt is calm and cooperative. Pt is medication compliant. Pt provided with support and encouragement. Pt monitored q15 minutes for safety per unit policy. Plan of care ongoing.   Problem: Nutrition: Goal: Adequate nutrition will be maintained Outcome: Progressing   Problem: Coping: Goal: Will verbalize feelings Outcome: Not Progressing

## 2022-06-18 NOTE — [facility] Group Notes (Signed)
[facility] Group Notes:  (Nursing/MHT/Case Management/Adjunct)  Date:  06/18/2022  Time:  9:29 AM  Type of Therapy:   community meeting  Participation Level:  Did Not Attend    Rodena Goldmann 06/18/2022, 9:29 AM

## 2022-06-19 DIAGNOSIS — F251 Schizoaffective disorder, depressive type: Secondary | ICD-10-CM | POA: Diagnosis not present

## 2022-06-19 MED ORDER — QUETIAPINE FUMARATE 200 MG PO TABS
400.0000 mg | ORAL_TABLET | Freq: Every day | ORAL | Status: DC
  Administered 2022-06-20 – 2022-08-06 (×48): 400 mg via ORAL
  Filled 2022-06-19 (×49): qty 2

## 2022-06-19 NOTE — BH IP Treatment Plan (Signed)
Interdisciplinary Treatment and Diagnostic Plan Update  06/19/2022 Time of Session: 08:30 [name] MRN: [id number]  Principal Diagnosis: Schizoaffective disorder, depressive type (HCC)  Secondary Diagnoses: Principal Problem:   Schizoaffective disorder, depressive type (HCC)   Current Medications:  Current Facility-Administered Medications  Medication Dose Route Frequency Provider Last Rate Last Admin   acetaminophen (TYLENOL) tablet 650 mg  650 mg Oral Q6H PRN [name], [name], MD       alum & mag hydroxide-simeth (MAALOX/MYLANTA) 200-200-20 MG/5ML suspension 30 mL  30 mL Oral Q4H PRN [name], [name], MD       haloperidol (HALDOL) tablet 5 mg  5 mg Oral BID [name], [name], MD   5 mg at 06/19/22 8756   Or   haloperidol lactate (HALDOL) injection 5 mg  5 mg Intramuscular BID [name], [name], MD       lithium carbonate capsule 600 mg  600 mg Oral QHS [name], [name], MD   600 mg at 06/18/22 2111   LORazepam (ATIVAN) tablet 1 mg  1 mg Oral BID [name], [name], MD   1 mg at 06/19/22 0815   Or   LORazepam (ATIVAN) injection 1 mg  1 mg Intramuscular BID [name], [name], MD       magnesium hydroxide (MILK OF MAGNESIA) suspension 30 mL  30 mL Oral Daily PRN [name], [name], MD       mirtazapine (REMERON) tablet 30 mg  30 mg Oral QHS [name], [name], MD   30 mg at 06/18/22 2111   multivitamin with minerals tablet 1 tablet  1 tablet Oral Daily [name], [name], MD   1 tablet at 06/19/22 0815   QUEtiapine (SEROQUEL) tablet 600 mg  600 mg Oral QHS Reggie Pile, MD   600 mg at 06/18/22 2111   thiamine (VITAMIN B1) tablet 100 mg  100 mg Oral Daily [name], [name], MD   100 mg at 06/19/22 0815   PTA Medications: Medications Prior to Admission  Medication Sig Dispense Refill Last Dose   clonazePAM (KLONOPIN) 0.5 MG tablet Take 0.5 tablets (0.25 mg total) by mouth 2 (two) times daily. (Patient not taking: Reported on 03/30/2022) 60 tablet 0    haloperidol (HALDOL) 5 MG tablet Take 1 tablet (5 mg  total) by mouth 2 (two) times daily. (Patient not taking: Reported on 03/30/2022) 60 tablet 3    midodrine (PROAMATINE) 2.5 MG tablet Take 1 tablet (2.5 mg total) by mouth 3 (three) times daily with meals. (Patient not taking: Reported on 03/30/2022)  0    mirtazapine (REMERON) 15 MG tablet Take 0.5 tablets (7.5 mg total) by mouth at bedtime. (Patient not taking: Reported on 03/30/2022) 30 tablet 2    Multiple Vitamin (MULTIVITAMIN WITH MINERALS) TABS tablet Take 1 tablet by mouth daily. (Patient not taking: Reported on 03/30/2022)      OLANZapine (ZYPREXA) 5 MG tablet Take 1 tablet (5 mg total) by mouth at bedtime. (Patient not taking: Reported on 03/30/2022) 30 tablet 2    thiamine 100 MG tablet Take 1 tablet (100 mg total) by mouth daily. (Patient not taking: Reported on 03/30/2022)       Patient Stressors: Marital or family conflict    Patient Strengths: Supportive family/friends   Treatment Modalities: Medication Management, Group therapy, Case management,  1 to 1 session with clinician, Psychoeducation, Recreational therapy.   Physician Treatment Plan for Primary Diagnosis: Schizoaffective disorder, depressive type (HCC) Long Term Goal(s): Improvement in symptoms so as ready for discharge  Short Term Goals: Compliance with prescribed medications will improve Ability to verbalize feelings will improve Ability to demonstrate self-control will improve Ability to identify and develop effective coping behaviors will improve  Medication Management: Evaluate patient's response, side effects, and tolerance of medication regimen.  Therapeutic Interventions: 1 to 1 sessions, Unit Group sessions and Medication administration.  Evaluation of Outcomes: Not Progressing  Physician Treatment Plan for Secondary Diagnosis: Principal Problem:   Schizoaffective disorder, depressive type (HCC)  Long Term Goal(s): Improvement in symptoms so as ready for discharge   Short Term Goals: Compliance with  prescribed medications will improve Ability to verbalize feelings will improve Ability to demonstrate self-control will improve Ability to identify and develop effective coping behaviors will improve     Medication Management: Evaluate patient's response, side effects, and tolerance of medication regimen.  Therapeutic Interventions: 1 to 1 sessions, Unit Group sessions and Medication administration.  Evaluation of Outcomes: Not Progressing   RN Treatment Plan for Primary Diagnosis: Schizoaffective disorder, depressive type (HCC) Long Term Goal(s): Knowledge of disease and therapeutic regimen to maintain health will improve  Short Term Goals: Ability to remain free from injury will improve, Ability to verbalize frustration and anger appropriately will improve, Ability to demonstrate self-control, Ability to participate in decision making will improve, Ability to verbalize feelings will improve, Ability to disclose and discuss suicidal ideas, Ability to identify and develop effective coping behaviors will improve, and Compliance with prescribed medications will improve  Medication Management: RN will administer medications as ordered by provider, will assess and evaluate patient's response and provide education to patient for prescribed medication. RN will report any adverse and/or side effects to prescribing provider.  Therapeutic Interventions: 1 on 1 counseling sessions, Psychoeducation, Medication administration, Evaluate responses to treatment, Monitor vital signs and CBGs as ordered, Perform/monitor CIWA, COWS, AIMS and Fall Risk screenings as ordered, Perform wound care treatments as ordered.  Evaluation of Outcomes: Not Progressing   LCSW Treatment Plan for Primary Diagnosis: Schizoaffective disorder, depressive type (HCC) Long Term Goal(s): Safe transition to appropriate next level of care at discharge, Engage patient in therapeutic group addressing interpersonal concerns.  Short  Term Goals: Engage patient in aftercare planning with referrals and resources, Increase social support, Increase ability to appropriately verbalize feelings, Increase emotional regulation, Facilitate acceptance of mental health diagnosis and concerns, Facilitate patient progression through stages of change regarding substance use diagnoses and concerns, Identify triggers associated with mental health/substance abuse issues, and Increase skills for wellness and recovery  Therapeutic Interventions: Assess for all discharge needs, 1 to 1 time with Social worker, Explore available resources and support systems, Assess for adequacy in community support network, Educate family and significant other(s) on suicide prevention, Complete Psychosocial Assessment, Interpersonal group therapy.  Evaluation of Outcomes: Not Progressing   Progress in Treatment: Attending groups: No. Participating in groups: No. Taking medication as prescribed: Yes. Toleration medication: Yes. Family/Significant other contact made: Yes, individual(s) contacted:  sister/guardian, [name] Begin-Lennon. Patient understands diagnosis: Yes. Discussing patient identified problems/goals with staff: Yes. Medical problems stabilized or resolved: Yes. Denies suicidal/homicidal ideation: Yes. Issues/concerns per patient self-inventory: No. Other: none.  New problem(s) identified: No, Describe:  none identified. Update 04/15/22: No changes at this time. Update 04/20/22: No changes at this time. Update 04/25/22: No changes at this time. Update 05/01/22: No changes at this time. 05/05/22 Update: No changes at this time. 05/10/22 Update: No changes at this time. 05/15/22 Update: No changes at this time. 05/21/22 Update: No changes at this time. 05/25/22 update:  No changes at this time. 05/30/22 Update: No changes at this time. 06/04/22 Update: none. 06/09/22 Update: No changes at this time. 06/14/22 Update: none. 06/19/22 Update: No changes at this  time.   New Short Term/Long Term Goal(s): elimination of symptoms of psychosis, medication management for mood stabilization; elimination of SI thoughts; development of comprehensive mental wellness plan. Update 04/15/22: No changes at this time. Update 04/20/22: No changes at this time. Update 04/25/22: No changes at this time. Update 05/01/22: No changes at this time. 05/05/22 Update: No changes at this time. 05/10/22 Update: No changes at this time. 05/15/22 Update: No changes at this time. 05/21/22 Update: No changes at this time. 05/25/22 update: No changes at this time. 05/30/22 Update: No changes at this time. 06/04/22 Update: Patient to work towards detox, elimination of symptoms of psychosis, medication management for mood stabilization; elimination of SI thoughts; development of comprehensive mental wellness/sobriety plan. 06/09/22 Update: No changes at this time. 06/14/22 Update: Patient to work towards elimination of symptoms of psychosis, medication management for mood stabilization; development of comprehensive mental wellness plan. 06/19/22 Update: No changes at this time.   Patient Goals:  "Nothing I want to work on. I want to be discharged." Update 04/15/22: No changes at this time. Update 04/20/22: No changes at this time. Update 04/25/22: No changes at this time. Update 05/01/22: No changes at this time. 05/05/22 Update: No changes at this time. 05/10/22 Update: No changes at this time. 05/15/22 Update: No changes at this time. 05/21/22 Update: No changes at this time. 05/25/22 update: No changes at this time. 05/30/22 Update: No changes at this time. 06/04/22 Update:  No additional goals identified at this time. Patient to continue to work towards original goals identified in initial treatment team meeting. CSW will remain available to patient should they voice additional treatment goals. 06/09/22 Update: No changes at this time. 06/14/22 Update: No additional goals identified at this time. Patient to  continue to work towards original goals identified in initial treatment team meeting. CSW will remain available to patient should they voice additional treatment goals. 06/19/22 Update: No changes at this time.   Discharge Plan or Barriers: CSW will assist pt with development of an appropriate aftercare/discharge plan. Update 04/15/22: Pt has been unwilling to engage with the treatment team around discharge. Update 04/20/22: No changes at this time.   Update 04/25/22: Patient will be returning to live with her sister. Pt will receive outpatient services through [facility]. Update 05/01/22: No changes at this time. 05/05/22 Update: No changes at this time. 05/10/22 Update: will return to her [facility] team. 05/15/22 Update: No changes at this time. 05/21/22 Update: No changes at this time. 05/25/22 update: No changes at this time. 05/30/22 Update: No changes at this time. 06/04/22 Update: No psychosocial barriers identified at this time, patient to return to place of residence when appropriate for discharge. 06/09/22 Update: No changes at this time. 06/14/22 Update: Patient lacks adequate housing and/or funding, CSW continues to monitor situation and update treatment team as information becomes available. 06/19/22 Update: No changes at this time.    Reason for Continuation of Hospitalization: Other; describe boarding   Estimated Length of Stay: 1-7 days Update 04/15/22: No changes at this time. Update 04/20/22: TBD Update 04/25/22: TBD Update 05/01/22: No changes at this time. 05/05/22 Update: No changes at this time. 05/10/22 Update: No changes at this time. 05/15/22 Update: No changes at this time. 05/21/22 Update: No changes at this time. 05/25/22 update: No changes at  this time. 05/30/22 Update: No changes at this time. 06/04/22 Update: No changes at this time. 06/09/22 Update: No changes at this time. 06/14/22 Update: 1-7 days 06/19/22 Update: No changes at this time.  Last 3 [place] Suicide Severity Risk Score: Flowsheet Row  Admission (Current) from 04/08/2022 in [facility] INPATIENT BEHAVIORAL MEDICINE Admission (Discharged) from 10/17/2021 in [facility] BEHAVIORAL MEDICINE Admission (Discharged) from 10/02/2021 in [facility] REGIONAL CARDIAC MED PCU  C-SSRS RISK CATEGORY No Risk No Risk No Risk       Last PHQ 2/9 Scores:     No data to display          Scribe for Treatment Team: Glenis Smoker, LCSW 06/19/2022 9:06 AM

## 2022-06-19 NOTE — Progress Notes (Signed)
[facility] MD Progress Note  06/19/2022 11:23 AM [name]  MRN:  [id number] Subjective: Patient seen and chart reviewed.  Follow-up 61 year old woman.  Patient has no new complaints.  Continues to be mostly withdrawn.  Frequently mumbling to herself.  I attempted to have them draw a CBC with differential yesterday so that we could try starting clozapine but the patient refused a blood draw. Principal Problem: Schizoaffective disorder, depressive type (HCC) Diagnosis: Principal Problem:   Schizoaffective disorder, depressive type (HCC)  Total Time spent with patient: 30 minutes  Past Psychiatric History: Long history of chronic psychotic disorder  Past Medical History:  Past Medical History:  Diagnosis Date   Anemia 10/02/2021   Non compliance w medication regimen    Schizophrenia [facility])     Past Surgical History:  Procedure Laterality Date   BIOPSY  09/25/2021   Procedure: BIOPSY;  Surgeon: Rachael Fee, MD;  Location: [facility];  Service: Gastroenterology;;   ESOPHAGOGASTRODUODENOSCOPY (EGD) WITH PROPOFOL [name] 09/25/2021   Procedure: ESOPHAGOGASTRODUODENOSCOPY (EGD) WITH PROPOFOL;  Surgeon: Rachael Fee, MD;  Location: [facility];  Service: Gastroenterology;  Laterality: [name];  With NGT placement   Family History: History reviewed. No pertinent family history. Family Psychiatric  History: None reported Social History:  Social History   Substance and Sexual Activity  Alcohol Use Not Currently   Comment: Refuses to disclose how much     Social History   Substance and Sexual Activity  Drug Use No   Comment: Refuses to answer    Social History   Socioeconomic History   Marital status: Single    Spouse name: Not on file   Number of children: Not on file   Years of education: Not on file   Highest education level: Not on file  Occupational History   Not on file  Tobacco Use   Smoking status: Every Day    Packs/day: 2.00    Types: Cigarettes   Smokeless tobacco:  Never  Substance and Sexual Activity   Alcohol use: Not Currently    Comment: Refuses to disclose how much   Drug use: No    Comment: Refuses to answer   Sexual activity: Not Currently    Comment: refused to answer  Other Topics Concern   Not on file  Social History Narrative   Not on file   Social Determinants of Health   Financial Resource Strain: Not on file  Food Insecurity: No Food Insecurity (04/08/2022)   Hunger Vital Sign    Worried About Running Out of Food in the Last Year: Never true    Ran Out of Food in the Last Year: Never true  Transportation Needs: No Transportation Needs (04/08/2022)   PRAPARE - Administrator, Civil Service (Medical): No    Lack of Transportation (Non-Medical): No  Physical Activity: Not on file  Stress: Not on file  Social Connections: Not on file   Additional Social History:                         Sleep: Fair  Appetite:  Fair  Current Medications: Current Facility-Administered Medications  Medication Dose Route Frequency Provider Last Rate Last Admin   acetaminophen (TYLENOL) tablet 650 mg  650 mg Oral Q6H PRN Tarissa Kerin T, MD       alum & mag hydroxide-simeth (MAALOX/MYLANTA) 200-200-20 MG/5ML suspension 30 mL  30 mL Oral Q4H PRN Yudith Norlander, [name], MD  haloperidol (HALDOL) tablet 5 mg  5 mg Oral BID Takeshia Wenk T, MD   5 mg at 06/19/22 8329   Or   haloperidol lactate (HALDOL) injection 5 mg  5 mg Intramuscular BID Yamaira Spinner T, MD       lithium carbonate capsule 600 mg  600 mg Oral QHS Helen Cuff T, MD   600 mg at 06/18/22 2111   LORazepam (ATIVAN) tablet 1 mg  1 mg Oral BID Kyren Knick T, MD   1 mg at 06/19/22 1916   Or   LORazepam (ATIVAN) injection 1 mg  1 mg Intramuscular BID Jenaye Rickert T, MD       magnesium hydroxide (MILK OF MAGNESIA) suspension 30 mL  30 mL Oral Daily PRN Bashir Marchetti T, MD       mirtazapine (REMERON) tablet 30 mg  30 mg Oral QHS Payson Crumby T, MD   30 mg at 06/18/22  2111   multivitamin with minerals tablet 1 tablet  1 tablet Oral Daily Amos Micheals, [name], MD   1 tablet at 06/19/22 0815   QUEtiapine (SEROQUEL) tablet 600 mg  600 mg Oral QHS Reggie Pile, MD   600 mg at 06/18/22 2111   thiamine (VITAMIN B1) tablet 100 mg  100 mg Oral Daily Velicia Dejager, [name], MD   100 mg at 06/19/22 0815    Lab Results: No results found for this or any previous visit (from the past 48 hour(s)).  Blood Alcohol level:  Lab Results  Component Value Date   ETH <10 03/29/2022   ETH <10 09/12/2021    Metabolic Disorder Labs: Lab Results  Component Value Date   HGBA1C 5.6 10/21/2021   MPG 114 10/21/2021   MPG 111 10/02/2021   Lab Results  Component Value Date   PROLACTIN 13.6 04/16/2015   Lab Results  Component Value Date   CHOL 189 10/21/2021   TRIG 52 10/21/2021   HDL 50 10/21/2021   CHOLHDL 3.8 10/21/2021   VLDL 10 10/21/2021   LDLCALC 129 (H) 10/21/2021   LDLCALC 93 10/01/2020    Physical Findings: AIMS: Facial and Oral Movements Muscles of Facial Expression: None, normal Lips and Perioral Area: None, normal Jaw: None, normal Tongue: None, normal,Extremity Movements Upper (arms, wrists, hands, fingers): None, normal Lower (legs, knees, ankles, toes): None, normal, Trunk Movements Neck, shoulders, hips: None, normal, Overall Severity Severity of abnormal movements (highest score from questions above): None, normal Incapacitation due to abnormal movements: None, normal Patient's awareness of abnormal movements (rate only patient's report): No Awareness, Dental Status Current problems with teeth and/or dentures?: No Does patient usually wear dentures?: No  CIWA:    COWS:     Musculoskeletal: Strength & Muscle Tone: within normal limits Gait & Station: normal Patient leans: [name]  Psychiatric Specialty Exam:  Presentation  General Appearance:  Disheveled  Eye Contact: Fair  Speech: Pressured  Speech  Volume: Increased  Handedness: Right   Mood and Affect  Mood: Irritable; Labile; Angry  Affect: Blunt; Labile   Thought Process  Thought Processes: Disorganized; Irrevelant  Descriptions of Associations:Loose  Orientation:Partial  Thought Content:Illogical; Rumination; Scattered; Tangential; Delusions  History of Schizophrenia/Schizoaffective disorder:Yes  Duration of Psychotic Symptoms:Greater than six months  Hallucinations:No data recorded Ideas of Reference:Percusatory; Paranoia  Suicidal Thoughts:No data recorded Homicidal Thoughts:No data recorded  Sensorium  Memory: Immediate Fair; Recent Fair; Remote Fair  Judgment: Impaired  Insight: Poor; Lacking   Executive Functions  Concentration: Poor  Attention Span: Poor  Recall: Poor  Fund of Knowledge: Poor  Language: Poor   Psychomotor Activity  Psychomotor Activity:No data recorded  Assets  Assets: Physical Health; Resilience; Housing; Social Support   Sleep  Sleep:No data recorded   Physical Exam: Physical Exam Vitals and nursing note reviewed.  Constitutional:      Appearance: Normal appearance.  HENT:     Head: Normocephalic and atraumatic.     Mouth/Throat:     Pharynx: Oropharynx is clear.  Eyes:     Pupils: Pupils are equal, round, and reactive to light.  Cardiovascular:     Rate and Rhythm: Normal rate and regular rhythm.  Pulmonary:     Effort: Pulmonary effort is normal.     Breath sounds: Normal breath sounds.  Abdominal:     General: Abdomen is flat.     Palpations: Abdomen is soft.  Musculoskeletal:        General: Normal range of motion.  Skin:    General: Skin is warm and dry.  Neurological:     General: No focal deficit present.     Mental Status: She is alert. Mental status is at baseline.  Psychiatric:        Attention and Perception: Attention normal.        Mood and Affect: Mood normal. Affect is blunt.        Speech: She is noncommunicative.         Thought Content: Thought content is paranoid.    Review of Systems  Constitutional: Negative.   HENT: Negative.    Eyes: Negative.   Respiratory: Negative.    Cardiovascular: Negative.   Gastrointestinal: Negative.   Musculoskeletal: Negative.   Skin: Negative.   Neurological: Negative.   Psychiatric/Behavioral: Negative.     Blood pressure 120/72, pulse (!) 56, temperature 98.5 F (36.9 C), temperature source Oral, resp. rate 16, height 5\' 4"  (1.626 m), weight 40.4 kg, SpO2 100 %. Body mass index is 15.28 kg/m.   Treatment Plan Summary: Medication management and Plan patient very set in her ways and very chronically psychotic and paranoid with family declining to take her home.  Social work is aware and we will have to work on this puzzle.  Patient does not appear to be capable of caring for herself but has no guardian.  , MD 06/19/2022, 11:23 AM

## 2022-06-19 NOTE — Progress Notes (Signed)
D- Patient alert and oriented x3. Affect flat Denies SI/HI/AVH and pain. She states her goal is to discharge.  A- Scheduled medications administered to patient, per MD orders. Support and encouragement provided.  Routine safety checks conducted every 15 minutes without incident.  Patient informed to notify staff with problems or concerns. R- No adverse drug reactions noted. Patient contracts for safety at this time. Patient compliant with medications and treatment plan. Patient receptive, calm, and cooperative. Patient interacts well with others on the unit.  Patient remains safe at this time.

## 2022-06-19 NOTE — [facility] Counselor (Signed)
CSW followed up on the patient's referral to [facility].   Referral was received and per Genevie Cheshire, Librarian, academic, the new [employer], will have to complete an evaluation on her to ascertain appropriateness.  Thayer Ohm is out of the office and will follow up on Monday 12/4 regarding appointment to meet patient via WebEx or face to face.  Penni Homans, MSW, LCSW 06/19/2022 12:56 PM

## 2022-06-19 NOTE — Plan of Care (Signed)
  Problem: Activity: Goal: Risk for activity intolerance will decrease Outcome: Progressing   Problem: Nutrition: Goal: Adequate nutrition will be maintained Outcome: Progressing   

## 2022-06-19 NOTE — Group Note (Signed)
[facility] LCSW Group Therapy Note    Group Date: 06/19/2022 Start Time: 1314 End Time: 1420  Type of Therapy and Topic:  Group Therapy:  Overcoming Obstacles  Participation Level:  [facility] PARTICIPATION LEVEL: Did Not Attend   Description of Group:   In this group patients will be encouraged to explore what they see as obstacles to their own wellness and recovery. They will be guided to discuss their thoughts, feelings, and behaviors related to these obstacles. The group will process together ways to cope with barriers, with attention given to specific choices patients can make. Each patient will be challenged to identify changes they are motivated to make in order to overcome their obstacles. This group will be process-oriented, with patients participating in exploration of their own experiences as well as giving and receiving support and challenge from other group members.  Therapeutic Goals: 1. Patient will identify personal and current obstacles as they relate to admission. 2. Patient will identify barriers that currently interfere with their wellness or overcoming obstacles.  3. Patient will identify feelings, thought process and behaviors related to these barriers. 4. Patient will identify two changes they are willing to make to overcome these obstacles:    Summary of Patient Progress Pt declined to attend group despite invitation.    Therapeutic Modalities:   Cognitive Behavioral Therapy Solution Focused Therapy Motivational Interviewing Relapse Prevention Therapy   Taeshawn Helfman R Kamilya Wakeman, LCSW 

## 2022-06-19 NOTE — Progress Notes (Signed)
Recreation Therapy Notes   Date: 06/19/2022  Time: 10:50 am     Location: Craft room   Behavioral response: [name]   Intervention Topic: Self-care    Discussion/Intervention: Patient refused to attend group.   Clinical Observations/Feedback:  Patient refused to attend group.    [name] LRT/CTRS          [name] 06/19/2022 12:37 PM 

## 2022-06-20 DIAGNOSIS — F251 Schizoaffective disorder, depressive type: Secondary | ICD-10-CM | POA: Diagnosis not present

## 2022-06-20 NOTE — [facility] Group Notes (Signed)
LCSW Group Therapy Note   06/20/2022 1:15pm   Type of Therapy and Topic:  Group Therapy:  Overcoming Obstacles   Participation Level:  Did Not Attend   Description of Group:    In this group patients will be encouraged to explore what they see as obstacles to their own wellness and recovery. They will be guided to discuss their thoughts, feelings, and behaviors related to these obstacles. The group will process together ways to cope with barriers, with attention given to specific choices patients can make. Each patient will be challenged to identify changes they are motivated to make in order to overcome their obstacles. This group will be process-oriented, with patients participating in exploration of their own experiences as well as giving and receiving support and challenge from other group members.   Therapeutic Goals: Patient will identify personal and current obstacles as they relate to admission. Patient will identify barriers that currently interfere with their wellness or overcoming obstacles.  Patient will identify feelings, thought process and behaviors related to these barriers. Patient will identify two changes they are willing to make to overcome these obstacles:      Summary of Patient Progress      Therapeutic Modalities:   Cognitive Behavioral Therapy Solution Focused Therapy Motivational Interviewing Relapse Prevention Therapy  Lorri Frederick, LCSW 06/20/2022 2:46 PM

## 2022-06-20 NOTE — Plan of Care (Signed)
  Problem: Coping: Goal: Level of anxiety will decrease Outcome: Progressing   Problem: Safety: Goal: Ability to remain free from injury will improve Outcome: Progressing   Problem: Skin Integrity: Goal: Risk for impaired skin integrity will decrease Outcome: Progressing   Problem: Activity: Goal: Will verbalize the importance of balancing activity with adequate rest periods Outcome: Progressing  Patient continues to be isolative to her room and minimal interactions denies SI/HI/A/VH and verbally contracted for safety at present. Q 15 minutes safety checks ongoing without self harm gestures. Support and encouragement ongoing.

## 2022-06-20 NOTE — Progress Notes (Signed)
D- Patient alert and oriented x 3. Affect flat, mood sad. Denies SI/HI/AVH. She denies pain. She states that she feels better with new order to decrease Seroquel to 400mg . Her ongoing goal is for discharge.   A- Scheduled medications administered to patient, per MD orders. Support and encouragement provided.  Routine safety checks conducted every 15 minutes without incident.  Patient informed to notify staff with problems or concerns. R- No adverse drug reactions noted. Patient contracts for safety at this time. Patient compliant with medications and treatment plan. Patient receptive, calm cooperative and interacts well with others on the unit.  Patient remains safe at this time.

## 2022-06-20 NOTE — Progress Notes (Signed)
[facility] MD Progress Note  06/20/2022 1:23 PM [name]  MRN:  [id number] Subjective: [name] is seen on rounds.  She does not have any complaints.  Nurses state that she is doing well.  She is sleeping well and her appetite is good.  No issues. Principal Problem: Schizoaffective disorder, depressive type (HCC) Diagnosis: Principal Problem:   Schizoaffective disorder, depressive type (HCC)  Total Time spent with patient: 15 minutes  Past Psychiatric History: Schizoaffective disorder  Past Medical History:  Past Medical History:  Diagnosis Date   Anemia 10/02/2021   Non compliance w medication regimen    Schizophrenia [facility])     Past Surgical History:  Procedure Laterality Date   BIOPSY  09/25/2021   Procedure: BIOPSY;  Surgeon: Rachael Fee, MD;  Location: [facility];  Service: Gastroenterology;;   ESOPHAGOGASTRODUODENOSCOPY (EGD) WITH PROPOFOL [name] 09/25/2021   Procedure: ESOPHAGOGASTRODUODENOSCOPY (EGD) WITH PROPOFOL;  Surgeon: Rachael Fee, MD;  Location: [facility];  Service: Gastroenterology;  Laterality: [name];  With NGT placement   Family History: History reviewed. No pertinent family history. Family Psychiatric  History: Unremarkable Social History:  Social History   Substance and Sexual Activity  Alcohol Use Not Currently   Comment: Refuses to disclose how much     Social History   Substance and Sexual Activity  Drug Use No   Comment: Refuses to answer    Social History   Socioeconomic History   Marital status: Single    Spouse name: Not on file   Number of children: Not on file   Years of education: Not on file   Highest education level: Not on file  Occupational History   Not on file  Tobacco Use   Smoking status: Every Day    Packs/day: 2.00    Types: Cigarettes   Smokeless tobacco: Never  Substance and Sexual Activity   Alcohol use: Not Currently    Comment: Refuses to disclose how much   Drug use: No    Comment: Refuses to answer   Sexual  activity: Not Currently    Comment: refused to answer  Other Topics Concern   Not on file  Social History Narrative   Not on file   Social Determinants of Health   Financial Resource Strain: Not on file  Food Insecurity: No Food Insecurity (04/08/2022)   Hunger Vital Sign    Worried About Running Out of Food in the Last Year: Never true    Ran Out of Food in the Last Year: Never true  Transportation Needs: No Transportation Needs (04/08/2022)   PRAPARE - Administrator, Civil Service (Medical): No    Lack of Transportation (Non-Medical): No  Physical Activity: Not on file  Stress: Not on file  Social Connections: Not on file   Additional Social History:                         Sleep: Good  Appetite:  Good  Current Medications: Current Facility-Administered Medications  Medication Dose Route Frequency Provider Last Rate Last Admin   acetaminophen (TYLENOL) tablet 650 mg  650 mg Oral Q6H PRN [name], [name], MD       alum & mag hydroxide-simeth (MAALOX/MYLANTA) 200-200-20 MG/5ML suspension 30 mL  30 mL Oral Q4H PRN [name], [name], MD       haloperidol (HALDOL) tablet 5 mg  5 mg Oral BID [name], [name], MD   5 mg at 06/20/22 0831   Or  haloperidol lactate (HALDOL) injection 5 mg  5 mg Intramuscular BID [name], [name], MD       lithium carbonate capsule 600 mg  600 mg Oral QHS [name], [name], MD   600 mg at 06/19/22 2046   LORazepam (ATIVAN) tablet 1 mg  1 mg Oral BID [name], [name], MD   1 mg at 06/20/22 0831   Or   LORazepam (ATIVAN) injection 1 mg  1 mg Intramuscular BID [name], [name], MD       magnesium hydroxide (MILK OF MAGNESIA) suspension 30 mL  30 mL Oral Daily PRN [name], [name], MD       mirtazapine (REMERON) tablet 30 mg  30 mg Oral QHS [name], [name], MD   30 mg at 06/19/22 2047   multivitamin with minerals tablet 1 tablet  1 tablet Oral Daily [name], [name], MD   1 tablet at 06/20/22 1015   QUEtiapine (SEROQUEL) tablet 400 mg  400 mg  Oral QHS [name], [name], MD       thiamine (VITAMIN B1) tablet 100 mg  100 mg Oral Daily [name], [name], MD   100 mg at 06/20/22 0831    Lab Results: No results found for this or any previous visit (from the past 48 hour(s)).  Blood Alcohol level:  Lab Results  Component Value Date   ETH <10 03/29/2022   ETH <10 09/12/2021    Metabolic Disorder Labs: Lab Results  Component Value Date   HGBA1C 5.6 10/21/2021   MPG 114 10/21/2021   MPG 111 10/02/2021   Lab Results  Component Value Date   PROLACTIN 13.6 04/16/2015   Lab Results  Component Value Date   CHOL 189 10/21/2021   TRIG 52 10/21/2021   HDL 50 10/21/2021   CHOLHDL 3.8 10/21/2021   VLDL 10 10/21/2021   LDLCALC 129 (H) 10/21/2021   LDLCALC 93 10/01/2020    Physical Findings: AIMS: Facial and Oral Movements Muscles of Facial Expression: None, normal Lips and Perioral Area: None, normal Jaw: None, normal Tongue: None, normal,Extremity Movements Upper (arms, wrists, hands, fingers): None, normal Lower (legs, knees, ankles, toes): None, normal, Trunk Movements Neck, shoulders, hips: None, normal, Overall Severity Severity of abnormal movements (highest score from questions above): None, normal Incapacitation due to abnormal movements: None, normal Patient's awareness of abnormal movements (rate only patient's report): No Awareness, Dental Status Current problems with teeth and/or dentures?: No Does patient usually wear dentures?: No  CIWA:    COWS:     Musculoskeletal: Strength & Muscle Tone: within normal limits Gait & Station: normal Patient leans: [name]  Psychiatric Specialty Exam:  Presentation  General Appearance:  Disheveled  Eye Contact: Fair  Speech: Pressured  Speech Volume: Increased  Handedness: Right   Mood and Affect  Mood: Irritable; Labile; Angry  Affect: Blunt; Labile   Thought Process  Thought Processes: Disorganized; Irrevelant  Descriptions of  Associations:Loose  Orientation:Partial  Thought Content:Illogical; Rumination; Scattered; Tangential; Delusions  History of Schizophrenia/Schizoaffective disorder:Yes  Duration of Psychotic Symptoms:Greater than six months  Hallucinations:No data recorded Ideas of Reference:Percusatory; Paranoia  Suicidal Thoughts:No data recorded Homicidal Thoughts:No data recorded  Sensorium  Memory: Immediate Fair; Recent Fair; Remote Fair  Judgment: Impaired  Insight: Poor; Lacking   Executive Functions  Concentration: Poor  Attention Span: Poor  Recall: Poor  Fund of Knowledge: Poor  Language: Poor   Psychomotor Activity  Psychomotor Activity:No data recorded  Assets  Assets: Physical Health; Resilience; Housing; Social Support  Sleep  Sleep:No data recorded    Blood pressure 114/67, pulse (!) 58, temperature 98.4 F (36.9 C), temperature source Oral, resp. rate 16, height 5\' 4"  (1.626 m), weight 40.4 kg, SpO2 100 %. Body mass index is 15.28 kg/m.   Treatment Plan Summary: Daily contact with patient to assess and evaluate symptoms and progress in treatment, Medication management, and Plan continue current medications.  , DO 06/20/2022, 1:23 PM

## 2022-06-20 NOTE — Progress Notes (Signed)
Patient remains isolative to her room.  She denies si/hi/avh.  She denies pain, anxiety and depression as well. She is med compliant and received her meds without incident.  Encouraged her to seek staff with any concerns.  Will continue to monitor with q 15 minute safety checks.    C Butler-Nicholson, LPN

## 2022-06-21 DIAGNOSIS — F251 Schizoaffective disorder, depressive type: Secondary | ICD-10-CM | POA: Diagnosis not present

## 2022-06-21 NOTE — Progress Notes (Incomplete)
Avayah remains isolative to her room except for snacks and medications.  She pleasant, cooperative and soft spoken. Wants to be discharged and has this as her only goal

## 2022-06-21 NOTE — Plan of Care (Signed)
  Problem: Coping: Goal: Level of anxiety will decrease Outcome: Progressing   Problem: Nutrition: Goal: Adequate nutrition will be maintained Outcome: Progressing   Problem: Pain Managment: Goal: General experience of comfort will improve Outcome: Progressing   Problem: Education: Goal: Knowledge of the prescribed therapeutic regimen will improve Outcome: Progressing   Problem: Health Behavior/Discharge Planning: Goal: Compliance with prescribed medication regimen will improve Outcome: Progressing

## 2022-06-21 NOTE — Progress Notes (Signed)
[facility] MD Progress Note  06/21/2022 1:24 PM [name]  MRN:  [id number] Subjective: [name] is seen on rounds.  She has been compliant with medications.  She denies any side effects.  She has no complaints and no issues.  Principal Problem: Schizoaffective disorder, depressive type ([place]) Diagnosis: Principal Problem:   Schizoaffective disorder, depressive type ([place])  Total Time spent with patient: 15 minutes  Past Psychiatric History: Schizoaffective disorder  Past Medical History:  Past Medical History:  Diagnosis Date   Anemia 10/02/2021   Non compliance w medication regimen    Schizophrenia [facility])     Past Surgical History:  Procedure Laterality Date   BIOPSY  09/25/2021   Procedure: BIOPSY;  Surgeon: Milus Banister, MD;  Location: [facility];  Service: Gastroenterology;;   ESOPHAGOGASTRODUODENOSCOPY (EGD) WITH PROPOFOL [name] 09/25/2021   Procedure: ESOPHAGOGASTRODUODENOSCOPY (EGD) WITH PROPOFOL;  Surgeon: Milus Banister, MD;  Location: [facility];  Service: Gastroenterology;  Laterality: [name];  With NGT placement   Family History: History reviewed. No pertinent family history. Family Psychiatric  History: Unremarkable Social History:  Social History   Substance and Sexual Activity  Alcohol Use Not Currently   Comment: Refuses to disclose how much     Social History   Substance and Sexual Activity  Drug Use No   Comment: Refuses to answer    Social History   Socioeconomic History   Marital status: Single    Spouse name: Not on file   Number of children: Not on file   Years of education: Not on file   Highest education level: Not on file  Occupational History   Not on file  Tobacco Use   Smoking status: Every Day    Packs/day: 2.00    Types: Cigarettes   Smokeless tobacco: Never  Substance and Sexual Activity   Alcohol use: Not Currently    Comment: Refuses to disclose how much   Drug use: No    Comment: Refuses to answer   Sexual activity: Not Currently     Comment: refused to answer  Other Topics Concern   Not on file  Social History Narrative   Not on file   Social Determinants of Health   Financial Resource Strain: Not on file  Food Insecurity: No Food Insecurity (04/08/2022)   Hunger Vital Sign    Worried About Running Out of Food in the Last Year: Never true    Ran Out of Food in the Last Year: Never true  Transportation Needs: No Transportation Needs (04/08/2022)   PRAPARE - Hydrologist (Medical): No    Lack of Transportation (Non-Medical): No  Physical Activity: Not on file  Stress: Not on file  Social Connections: Not on file   Additional Social History:                         Sleep: Good  Appetite:  Good  Current Medications: Current Facility-Administered Medications  Medication Dose Route Frequency Provider Last Rate Last Admin   acetaminophen (TYLENOL) tablet 650 mg  650 mg Oral Q6H PRN [name], [name], MD       alum & mag hydroxide-simeth (MAALOX/MYLANTA) 200-200-20 MG/5ML suspension 30 mL  30 mL Oral Q4H PRN [name], [name], MD       haloperidol (HALDOL) tablet 5 mg  5 mg Oral BID [name], [name], MD   5 mg at 06/21/22 [id number]   Or   haloperidol lactate (HALDOL) injection  5 mg  5 mg Intramuscular BID [name], [name], MD       lithium carbonate capsule 600 mg  600 mg Oral QHS [name], [name], MD   600 mg at 06/20/22 2101   LORazepam (ATIVAN) tablet 1 mg  1 mg Oral BID [name], [name], MD   1 mg at 06/21/22 9892   Or   LORazepam (ATIVAN) injection 1 mg  1 mg Intramuscular BID [name], [name], MD       magnesium hydroxide (MILK OF MAGNESIA) suspension 30 mL  30 mL Oral Daily PRN [name], [name], MD       mirtazapine (REMERON) tablet 30 mg  30 mg Oral QHS [name], [name], MD   30 mg at 06/20/22 2102   multivitamin with minerals tablet 1 tablet  1 tablet Oral Daily [name], [name], MD   1 tablet at 06/21/22 1194   QUEtiapine (SEROQUEL) tablet 400 mg  400 mg Oral QHS [name], [name],  MD   400 mg at 06/20/22 2102   thiamine (VITAMIN B1) tablet 100 mg  100 mg Oral Daily [name], [name], MD   100 mg at 06/21/22 1740    Lab Results: No results found for this or any previous visit (from the past 48 hour(s)).  Blood Alcohol level:  Lab Results  Component Value Date   ETH <10 03/29/2022   ETH <10 09/12/2021    Metabolic Disorder Labs: Lab Results  Component Value Date   HGBA1C 5.6 10/21/2021   MPG 114 10/21/2021   MPG 111 10/02/2021   Lab Results  Component Value Date   PROLACTIN 13.6 04/16/2015   Lab Results  Component Value Date   CHOL 189 10/21/2021   TRIG 52 10/21/2021   HDL 50 10/21/2021   CHOLHDL 3.8 10/21/2021   VLDL 10 10/21/2021   LDLCALC 129 (H) 10/21/2021   LDLCALC 93 10/01/2020    Physical Findings: AIMS: Facial and Oral Movements Muscles of Facial Expression: None, normal Lips and Perioral Area: None, normal Jaw: None, normal Tongue: None, normal,Extremity Movements Upper (arms, wrists, hands, fingers): None, normal Lower (legs, knees, ankles, toes): None, normal, Trunk Movements Neck, shoulders, hips: None, normal, Overall Severity Severity of abnormal movements (highest score from questions above): None, normal Incapacitation due to abnormal movements: None, normal Patient's awareness of abnormal movements (rate only patient's report): No Awareness, Dental Status Current problems with teeth and/or dentures?: No Does patient usually wear dentures?: No  CIWA:    COWS:     Musculoskeletal: Strength & Muscle Tone: within normal limits Gait & Station: normal Patient leans: [name]  Psychiatric Specialty Exam:  Presentation  General Appearance:  Disheveled  Eye Contact: Fair  Speech: Pressured  Speech Volume: Increased  Handedness: Right   Mood and Affect  Mood: Irritable; Labile; Angry  Affect: Blunt; Labile   Thought Process  Thought Processes: Disorganized; Irrevelant  Descriptions of  Associations:Loose  Orientation:Partial  Thought Content:Illogical; Rumination; Scattered; Tangential; Delusions  History of Schizophrenia/Schizoaffective disorder:Yes  Duration of Psychotic Symptoms:Greater than six months  Hallucinations:No data recorded Ideas of Reference:Percusatory; Paranoia  Suicidal Thoughts:No data recorded Homicidal Thoughts:No data recorded  Sensorium  Memory: Immediate Fair; Recent Fair; Remote Fair  Judgment: Impaired  Insight: Poor; Lacking   Executive Functions  Concentration: Poor  Attention Span: Poor  Recall: Poor  Fund of Knowledge: Poor  Language: Poor   Psychomotor Activity  Psychomotor Activity:No data recorded  Assets  Assets: Physical Health; Resilience; Housing; Social Support  Sleep  Sleep:No data recorded    Blood pressure 97/69, pulse 66, temperature 98.3 F (36.8 C), temperature source Oral, resp. rate 16, height 5\' 4"  (1.626 m), weight 40.4 kg, SpO2 97 %. Body mass index is 15.28 kg/m.   Treatment Plan Summary: Daily contact with patient to assess and evaluate symptoms and progress in treatment, Medication management, and Plan continue current medications.  Thula Stewart , DO 06/21/2022, 1:24 PM

## 2022-06-21 NOTE — Progress Notes (Signed)
[name] remains isolative to her room except for snacks and medications.  She is pleasant, cooperative and soft spoken. Wants to be discharged and has this as her only goal. Support and encouragement offered to her.  She denies si hi avh. She ie med compliant and has  6 15 minute safety checks in place. Encouraged her to come to nurses station with any concerns she may have.    C Butler-Nicholson, LPN

## 2022-06-22 DIAGNOSIS — F251 Schizoaffective disorder, depressive type: Secondary | ICD-10-CM | POA: Diagnosis not present

## 2022-06-22 NOTE — Progress Notes (Signed)
Patient calm and pleasant during assessment denying SI/HI/AVH. Pt observed interacting appropriately with staff and peers on the unit. Pt compliant with medication administration per MD orders. Pt given education, support, and encouragement to be active in her treatment plan. Pt being monitored Q 15 minutes for safety per unit protocol, remains safe on the unit   

## 2022-06-22 NOTE — Group Note (Signed)
[facility] LCSW Group Therapy Note    Group Date: 06/22/2022 Start Time: 1300 End Time: 1400  Type of Therapy and Topic:  Group Therapy:  Overcoming Obstacles  Participation Level:  [facility] PARTICIPATION LEVEL: Did Not Attend  Mood:  Description of Group:   In this group patients will be encouraged to explore what they see as obstacles to their own wellness and recovery. They will be guided to discuss their thoughts, feelings, and behaviors related to these obstacles. The group will process together ways to cope with barriers, with attention given to specific choices patients can make. Each patient will be challenged to identify changes they are motivated to make in order to overcome their obstacles. This group will be process-oriented, with patients participating in exploration of their own experiences as well as giving and receiving support and challenge from other group members.  Therapeutic Goals: 1. Patient will identify personal and current obstacles as they relate to admission. 2. Patient will identify barriers that currently interfere with their wellness or overcoming obstacles.  3. Patient will identify feelings, thought process and behaviors related to these barriers. 4. Patient will identify two changes they are willing to make to overcome these obstacles:    Summary of Patient Progress    Patient declined to attend group, though invited by this clinician.   Therapeutic Modalities:   Cognitive Behavioral Therapy Solution Focused Therapy Motivational Interviewing Relapse Prevention Therapy   Nori Poland J Eura Radabaugh, LCSW 

## 2022-06-22 NOTE — Plan of Care (Signed)
D: Patient alert and oriented. Patient denies pain. Patient denies anxiety and depression. Patient denies SI/HI/AVH. Patient remains isolative to room with exception to coming out for meals and medication.  A: Scheduled medications administered to patient, per MD orders.  Support and encouragement provided to patient.  Q15 minute safety checks maintained.   R: Patient compliant with medication administration and treatment plan. No adverse drug reactions noted. Patient remains safe on the unit at this time. Problem: Education: Goal: Knowledge of General Education information will improve Description: Including pain rating scale, medication(s)/side effects and non-pharmacologic comfort measures Outcome: Progressing   Problem: Clinical Measurements: Goal: Ability to maintain clinical measurements within normal limits will improve Outcome: Progressing   Problem: Education: Goal: Knowledge of [place] General Education information/materials will improve Outcome: Progressing Goal: Verbalization of understanding the information provided will improve Outcome: Progressing   Problem: Health Behavior/Discharge Planning: Goal: Compliance with treatment plan for underlying cause of condition will improve Outcome: Progressing   Problem: Physical Regulation: Goal: Ability to maintain clinical measurements within normal limits will improve Outcome: Progressing

## 2022-06-22 NOTE — Progress Notes (Signed)
[facility] MD Progress Note  06/22/2022 3:00 PM [name]  MRN:  [id number] Subjective: Follow-up 61 year old woman with schizoaffective disorder.  I had cut back on her Seroquel prior to the weekend out of concern expressed by staff that she was getting over sedated or looked like she might be a risk of a fall especially at night.  Patient has no complaints except wanting to be discharged and does not present as clinically any different.  Stays isolated for the most part. Principal Problem: Schizoaffective disorder, depressive type (HCC) Diagnosis: Principal Problem:   Schizoaffective disorder, depressive type (HCC)  Total Time spent with patient: 30 minutes  Past Psychiatric History: Past history of schizoaffective disorder  Past Medical History:  Past Medical History:  Diagnosis Date   Anemia 10/02/2021   Non compliance w medication regimen    Schizophrenia [facility])     Past Surgical History:  Procedure Laterality Date   BIOPSY  09/25/2021   Procedure: BIOPSY;  Surgeon: Rachael Fee, MD;  Location: [facility];  Service: Gastroenterology;;   ESOPHAGOGASTRODUODENOSCOPY (EGD) WITH PROPOFOL [name] 09/25/2021   Procedure: ESOPHAGOGASTRODUODENOSCOPY (EGD) WITH PROPOFOL;  Surgeon: Rachael Fee, MD;  Location: [facility];  Service: Gastroenterology;  Laterality: [name];  With NGT placement   Family History: History reviewed. No pertinent family history. Family Psychiatric  History: See previous Social History:  Social History   Substance and Sexual Activity  Alcohol Use Not Currently   Comment: Refuses to disclose how much     Social History   Substance and Sexual Activity  Drug Use No   Comment: Refuses to answer    Social History   Socioeconomic History   Marital status: Single    Spouse name: Not on file   Number of children: Not on file   Years of education: Not on file   Highest education level: Not on file  Occupational History   Not on file  Tobacco Use   Smoking  status: Every Day    Packs/day: 2.00    Types: Cigarettes   Smokeless tobacco: Never  Substance and Sexual Activity   Alcohol use: Not Currently    Comment: Refuses to disclose how much   Drug use: No    Comment: Refuses to answer   Sexual activity: Not Currently    Comment: refused to answer  Other Topics Concern   Not on file  Social History Narrative   Not on file   Social Determinants of Health   Financial Resource Strain: Not on file  Food Insecurity: No Food Insecurity (04/08/2022)   Hunger Vital Sign    Worried About Running Out of Food in the Last Year: Never true    Ran Out of Food in the Last Year: Never true  Transportation Needs: No Transportation Needs (04/08/2022)   PRAPARE - Administrator, Civil Service (Medical): No    Lack of Transportation (Non-Medical): No  Physical Activity: Not on file  Stress: Not on file  Social Connections: Not on file   Additional Social History:                         Sleep: Fair  Appetite:  Fair  Current Medications: Current Facility-Administered Medications  Medication Dose Route Frequency Provider Last Rate Last Admin   acetaminophen (TYLENOL) tablet 650 mg  650 mg Oral Q6H PRN Jarquavious Fentress T, MD       alum & mag hydroxide-simeth (MAALOX/MYLANTA) 200-200-20 MG/5ML suspension 30 mL  30 mL Oral Q4H PRN Lanyiah Brix T, MD       haloperidol (HALDOL) tablet 5 mg  5 mg Oral BID Lariya Kinzie, [name], MD   5 mg at 06/22/22 1740   Or   haloperidol lactate (HALDOL) injection 5 mg  5 mg Intramuscular BID Vieno Tarrant T, MD       lithium carbonate capsule 600 mg  600 mg Oral QHS Markavious Micco T, MD   600 mg at 06/21/22 2101   LORazepam (ATIVAN) tablet 1 mg  1 mg Oral BID Isack Lavalley T, MD   1 mg at 06/22/22 8144   Or   LORazepam (ATIVAN) injection 1 mg  1 mg Intramuscular BID Shaquila Sigman T, MD       magnesium hydroxide (MILK OF MAGNESIA) suspension 30 mL  30 mL Oral Daily PRN Gustavus Haskin T, MD        mirtazapine (REMERON) tablet 30 mg  30 mg Oral QHS Nihal Marzella T, MD   30 mg at 06/21/22 2102   multivitamin with minerals tablet 1 tablet  1 tablet Oral Daily Keaten Mashek, [name], MD   1 tablet at 06/22/22 0816   QUEtiapine (SEROQUEL) tablet 400 mg  400 mg Oral QHS Jawon Dipiero T, MD   400 mg at 06/21/22 2101   thiamine (VITAMIN B1) tablet 100 mg  100 mg Oral Daily Damen Windsor, [name], MD   100 mg at 06/22/22 8185    Lab Results: No results found for this or any previous visit (from the past 48 hour(s)).  Blood Alcohol level:  Lab Results  Component Value Date   ETH <10 03/29/2022   ETH <10 09/12/2021    Metabolic Disorder Labs: Lab Results  Component Value Date   HGBA1C 5.6 10/21/2021   MPG 114 10/21/2021   MPG 111 10/02/2021   Lab Results  Component Value Date   PROLACTIN 13.6 04/16/2015   Lab Results  Component Value Date   CHOL 189 10/21/2021   TRIG 52 10/21/2021   HDL 50 10/21/2021   CHOLHDL 3.8 10/21/2021   VLDL 10 10/21/2021   LDLCALC 129 (H) 10/21/2021   LDLCALC 93 10/01/2020    Physical Findings: AIMS: Facial and Oral Movements Muscles of Facial Expression: None, normal Lips and Perioral Area: None, normal Jaw: None, normal Tongue: None, normal,Extremity Movements Upper (arms, wrists, hands, fingers): None, normal Lower (legs, knees, ankles, toes): None, normal, Trunk Movements Neck, shoulders, hips: None, normal, Overall Severity Severity of abnormal movements (highest score from questions above): None, normal Incapacitation due to abnormal movements: None, normal Patient's awareness of abnormal movements (rate only patient's report): No Awareness, Dental Status Current problems with teeth and/or dentures?: No Does patient usually wear dentures?: No  CIWA:    COWS:     Musculoskeletal: Strength & Muscle Tone: within normal limits Gait & Station: normal Patient leans: [name]  Psychiatric Specialty Exam:  Presentation  General Appearance:   Disheveled  Eye Contact: Fair  Speech: Pressured  Speech Volume: Increased  Handedness: Right   Mood and Affect  Mood: Irritable; Labile; Angry  Affect: Blunt; Labile   Thought Process  Thought Processes: Disorganized; Irrevelant  Descriptions of Associations:Loose  Orientation:Partial  Thought Content:Illogical; Rumination; Scattered; Tangential; Delusions  History of Schizophrenia/Schizoaffective disorder:Yes  Duration of Psychotic Symptoms:Greater than six months  Hallucinations:No data recorded Ideas of Reference:Percusatory; Paranoia  Suicidal Thoughts:No data recorded Homicidal Thoughts:No data recorded  Sensorium  Memory: Immediate Fair; Recent Fair; Remote Fair  Judgment: Impaired  Insight:  Poor; Lacking   Executive Functions  Concentration: Poor  Attention Span: Poor  Recall: Poor  Fund of Knowledge: Poor  Language: Poor   Psychomotor Activity  Psychomotor Activity:No data recorded  Assets  Assets: Physical Health; Resilience; Housing; Social Support   Sleep  Sleep:No data recorded   Physical Exam: Physical Exam Vitals and nursing note reviewed.  Constitutional:      Appearance: Normal appearance.  HENT:     Head: Normocephalic and atraumatic.     Mouth/Throat:     Pharynx: Oropharynx is clear.  Eyes:     Pupils: Pupils are equal, round, and reactive to light.  Cardiovascular:     Rate and Rhythm: Normal rate and regular rhythm.  Pulmonary:     Effort: Pulmonary effort is normal.     Breath sounds: Normal breath sounds.  Abdominal:     General: Abdomen is flat.     Palpations: Abdomen is soft.  Musculoskeletal:        General: Normal range of motion.  Skin:    General: Skin is warm and dry.  Neurological:     General: No focal deficit present.     Mental Status: She is alert. Mental status is at baseline.  Psychiatric:        Attention and Perception: She is inattentive.        Mood and Affect:  Mood normal. Affect is blunt and inappropriate.        Speech: Speech is tangential.        Behavior: Behavior is agitated. Behavior is cooperative.        Thought Content: Thought content is paranoid.        Cognition and Memory: Cognition is impaired.    Review of Systems  Constitutional: Negative.   HENT: Negative.    Eyes: Negative.   Respiratory: Negative.    Cardiovascular: Negative.   Gastrointestinal: Negative.   Musculoskeletal: Negative.   Skin: Negative.   Neurological: Negative.   Psychiatric/Behavioral: Negative.     Blood pressure 109/73, pulse 62, temperature 97.8 F (36.6 C), temperature source Oral, resp. rate 16, height 5\' 4"  (1.626 m), weight 40.4 kg, SpO2 99 %. Body mass index is 15.28 kg/m.   Treatment Plan Summary: Medication management and Plan patient appears to have shown a little improvement on medicine but did not really have a better response to 600 mg and 400 of the Seroquel.  Remains psychotic.  FH could consider treatment with clozapine have been stymied by her refusal to accept blood testing.  We continue to have difficulty with placement.  Trying to get family to pick her up.  , MD 06/22/2022, 3:00 PM

## 2022-06-23 DIAGNOSIS — F251 Schizoaffective disorder, depressive type: Secondary | ICD-10-CM | POA: Diagnosis not present

## 2022-06-23 NOTE — Plan of Care (Signed)
  Problem: Health Behavior/Discharge Planning: Goal: Compliance with prescribed medication regimen will improve Outcome: Progressing   Problem: Nutritional: Goal: Ability to achieve adequate nutritional intake will improve Outcome: Progressing   

## 2022-06-23 NOTE — Group Note (Signed)
[facility] LCSW Group Therapy Note   Group Date: 06/23/2022 Start Time: 1300 End Time: 1400   Type of Therapy/Topic:  Group Therapy:  Emotion Regulation  Participation Level:  Did Not Attend   Mood:  Description of Group:    The purpose of this group is to assist patients in learning to regulate negative emotions and experience positive emotions. Patients will be guided to discuss ways in which they have been vulnerable to their negative emotions. These vulnerabilities will be juxtaposed with experiences of positive emotions or situations, and patients challenged to use positive emotions to combat negative ones. Special emphasis will be placed on coping with negative emotions in conflict situations, and patients will process healthy conflict resolution skills.  Therapeutic Goals: Patient will identify two positive emotions or experiences to reflect on in order to balance out negative emotions:  Patient will label two or more emotions that they find the most difficult to experience:  Patient will be able to demonstrate positive conflict resolution skills through discussion or role plays:   Summary of Patient Progress:   Patient did not attend group despite encouraged participation.     Therapeutic Modalities:   Cognitive Behavioral Therapy Feelings Identification Dialectical Behavioral Therapy   Recardo Linn W Elliett Guarisco, LCSWA 

## 2022-06-23 NOTE — Progress Notes (Signed)
Patient calm and pleasant during assessment denying SI/HI/AVH. Pt observed interacting appropriately with staff and peers on the unit. Pt compliant with medication administration per MD orders. Pt given education, support, and encouragement to be active in her treatment plan. Pt being monitored Q 15 minutes for safety per unit protocol, remains safe on the unit   

## 2022-06-23 NOTE — Progress Notes (Signed)
[facility] MD Progress Note  06/23/2022 3:20 PM [name]  MRN:  [id number] Subjective: Patient seen.  No change in presentation.  Mostly isolative. Principal Problem: Schizoaffective disorder, depressive type (HCC) Diagnosis: Principal Problem:   Schizoaffective disorder, depressive type (HCC)  Total Time spent with patient: 20 minutes  Past Psychiatric History: Past history of schizoaffective disorder  Past Medical History:  Past Medical History:  Diagnosis Date   Anemia 10/02/2021   Non compliance w medication regimen    Schizophrenia [facility])     Past Surgical History:  Procedure Laterality Date   BIOPSY  09/25/2021   Procedure: BIOPSY;  Surgeon: Rachael Fee, MD;  Location: [facility];  Service: Gastroenterology;;   ESOPHAGOGASTRODUODENOSCOPY (EGD) WITH PROPOFOL [name] 09/25/2021   Procedure: ESOPHAGOGASTRODUODENOSCOPY (EGD) WITH PROPOFOL;  Surgeon: Rachael Fee, MD;  Location: [facility];  Service: Gastroenterology;  Laterality: [name];  With NGT placement   Family History: History reviewed. No pertinent family history. Family Psychiatric  History: No change Social History:  Social History   Substance and Sexual Activity  Alcohol Use Not Currently   Comment: Refuses to disclose how much     Social History   Substance and Sexual Activity  Drug Use No   Comment: Refuses to answer    Social History   Socioeconomic History   Marital status: Single    Spouse name: Not on file   Number of children: Not on file   Years of education: Not on file   Highest education level: Not on file  Occupational History   Not on file  Tobacco Use   Smoking status: Every Day    Packs/day: 2.00    Types: Cigarettes   Smokeless tobacco: Never  Substance and Sexual Activity   Alcohol use: Not Currently    Comment: Refuses to disclose how much   Drug use: No    Comment: Refuses to answer   Sexual activity: Not Currently    Comment: refused to answer  Other Topics Concern   Not on  file  Social History Narrative   Not on file   Social Determinants of Health   Financial Resource Strain: Not on file  Food Insecurity: No Food Insecurity (04/08/2022)   Hunger Vital Sign    Worried About Running Out of Food in the Last Year: Never true    Ran Out of Food in the Last Year: Never true  Transportation Needs: No Transportation Needs (04/08/2022)   PRAPARE - Administrator, Civil Service (Medical): No    Lack of Transportation (Non-Medical): No  Physical Activity: Not on file  Stress: Not on file  Social Connections: Not on file   Additional Social History:                         Sleep: Fair  Appetite:  Fair  Current Medications: Current Facility-Administered Medications  Medication Dose Route Frequency Provider Last Rate Last Admin   acetaminophen (TYLENOL) tablet 650 mg  650 mg Oral Q6H PRN Emir Nack T, MD       alum & mag hydroxide-simeth (MAALOX/MYLANTA) 200-200-20 MG/5ML suspension 30 mL  30 mL Oral Q4H PRN Sakia Schrimpf T, MD       haloperidol (HALDOL) tablet 5 mg  5 mg Oral BID Denman Pichardo, [name], MD   5 mg at 06/23/22 6073   Or   haloperidol lactate (HALDOL) injection 5 mg  5 mg Intramuscular BID Mi Balla, [name], MD  lithium carbonate capsule 600 mg  600 mg Oral QHS Parley Pidcock T, MD   600 mg at 06/22/22 2036   LORazepam (ATIVAN) tablet 1 mg  1 mg Oral BID Serafina Topham T, MD   1 mg at 06/23/22 6269   Or   LORazepam (ATIVAN) injection 1 mg  1 mg Intramuscular BID Kimble Hitchens T, MD       magnesium hydroxide (MILK OF MAGNESIA) suspension 30 mL  30 mL Oral Daily PRN Messi Twedt T, MD       mirtazapine (REMERON) tablet 30 mg  30 mg Oral QHS Clifford Benninger T, MD   30 mg at 06/22/22 2036   multivitamin with minerals tablet 1 tablet  1 tablet Oral Daily Jajaira Ruis, [name], MD   1 tablet at 06/23/22 4854   QUEtiapine (SEROQUEL) tablet 400 mg  400 mg Oral QHS Amiria Orrison T, MD   400 mg at 06/22/22 2036   thiamine (VITAMIN B1) tablet  100 mg  100 mg Oral Daily Merriel Zinger, [name], MD   100 mg at 06/23/22 6270    Lab Results: No results found for this or any previous visit (from the past 48 hour(s)).  Blood Alcohol level:  Lab Results  Component Value Date   ETH <10 03/29/2022   ETH <10 09/12/2021    Metabolic Disorder Labs: Lab Results  Component Value Date   HGBA1C 5.6 10/21/2021   MPG 114 10/21/2021   MPG 111 10/02/2021   Lab Results  Component Value Date   PROLACTIN 13.6 04/16/2015   Lab Results  Component Value Date   CHOL 189 10/21/2021   TRIG 52 10/21/2021   HDL 50 10/21/2021   CHOLHDL 3.8 10/21/2021   VLDL 10 10/21/2021   LDLCALC 129 (H) 10/21/2021   LDLCALC 93 10/01/2020    Physical Findings: AIMS: Facial and Oral Movements Muscles of Facial Expression: None, normal Lips and Perioral Area: None, normal Jaw: None, normal Tongue: None, normal,Extremity Movements Upper (arms, wrists, hands, fingers): None, normal Lower (legs, knees, ankles, toes): None, normal, Trunk Movements Neck, shoulders, hips: None, normal, Overall Severity Severity of abnormal movements (highest score from questions above): None, normal Incapacitation due to abnormal movements: None, normal Patient's awareness of abnormal movements (rate only patient's report): No Awareness, Dental Status Current problems with teeth and/or dentures?: No Does patient usually wear dentures?: No  CIWA:    COWS:     Musculoskeletal: Strength & Muscle Tone: within normal limits Gait & Station: normal Patient leans: [name]  Psychiatric Specialty Exam:  Presentation  General Appearance:  Disheveled  Eye Contact: Fair  Speech: Pressured  Speech Volume: Increased  Handedness: Right   Mood and Affect  Mood: Irritable; Labile; Angry  Affect: Blunt; Labile   Thought Process  Thought Processes: Disorganized; Irrevelant  Descriptions of Associations:Loose  Orientation:Partial  Thought Content:Illogical; Rumination;  Scattered; Tangential; Delusions  History of Schizophrenia/Schizoaffective disorder:Yes  Duration of Psychotic Symptoms:Greater than six months  Hallucinations:No data recorded Ideas of Reference:Percusatory; Paranoia  Suicidal Thoughts:No data recorded Homicidal Thoughts:No data recorded  Sensorium  Memory: Immediate Fair; Recent Fair; Remote Fair  Judgment: Impaired  Insight: Poor; Lacking   Executive Functions  Concentration: Poor  Attention Span: Poor  Recall: Poor  Fund of Knowledge: Poor  Language: Poor   Psychomotor Activity  Psychomotor Activity:No data recorded  Assets  Assets: Physical Health; Resilience; Housing; Social Support   Sleep  Sleep:No data recorded   Physical Exam: Physical Exam Vitals and nursing note reviewed.  Constitutional:      Appearance: Normal appearance.  HENT:     Head: Normocephalic and atraumatic.     Mouth/Throat:     Pharynx: Oropharynx is clear.  Eyes:     Pupils: Pupils are equal, round, and reactive to light.  Cardiovascular:     Rate and Rhythm: Normal rate and regular rhythm.  Pulmonary:     Effort: Pulmonary effort is normal.     Breath sounds: Normal breath sounds.  Abdominal:     General: Abdomen is flat.     Palpations: Abdomen is soft.  Musculoskeletal:        General: Normal range of motion.  Skin:    General: Skin is warm and dry.  Neurological:     General: No focal deficit present.     Mental Status: She is alert. Mental status is at baseline.  Psychiatric:        Attention and Perception: She is inattentive.        Mood and Affect: Mood normal. Affect is blunt.        Speech: Speech is delayed.        Behavior: Behavior is slowed.    Review of Systems  Constitutional: Negative.   HENT: Negative.    Eyes: Negative.   Respiratory: Negative.    Cardiovascular: Negative.   Gastrointestinal: Negative.   Musculoskeletal: Negative.   Skin: Negative.   Neurological: Negative.    Psychiatric/Behavioral: Negative.     Blood pressure 119/70, pulse (!) 50, temperature 97.7 F (36.5 C), temperature source Oral, resp. rate 16, height 5\' 4"  (1.626 m), weight 40.4 kg, SpO2 100 %. Body mass index is 15.28 kg/m.   Treatment Plan Summary: Medication management and Plan no change to plan or medication management.  Continue working on trying to find some way to get family to take her home.  , MD 06/23/2022, 3:20 PM

## 2022-06-23 NOTE — Progress Notes (Signed)
Pt denies SI/HI/AVH and verbally agrees to approach staff if these become apparent or before harming themselves/others. Rates depression 0/10. Rates anxiety 0/10. Rates pain 0/10.   Scheduled medications administered to pt, per MD orders. RN provided support and encouragement to pt. Q15 min safety checks implemented and continued. Pt safe on the unit. RN will continue to monitor and intervene as needed.  06/23/22 0830  Psych Admission Type (Psych Patients Only)  Admission Status Voluntary  Psychosocial Assessment  Patient Complaints None  Eye Contact Fair  Facial Expression Flat  Affect Flat  Speech Logical/coherent  Interaction Isolative;Minimal  Motor Activity Slow  Appearance/Hygiene Unremarkable;In scrubs  Behavior Characteristics Appropriate to situation;Cooperative;Calm  Mood Pleasant  Thought Process  Coherency WDL  Content WDL  Delusions None reported or observed  Perception WDL  Hallucination None reported or observed  Judgment Limited  Confusion None  Danger to Self  Current suicidal ideation? Denies  Danger to Others  Danger to Others None reported or observed

## 2022-06-23 NOTE — Progress Notes (Signed)
Recreation Therapy Notes  Date: 06/23/2022   Time: 10:00 am      Location: Craft room    Behavioral response: [name]   Intervention Topic: Values    Discussion/Intervention: Patient refused to attend group.    Clinical Observations/Feedback:  Patient refused to attend group.    [name] LRT/CTRS          [name] 06/23/2022 10:59 AM 

## 2022-06-24 DIAGNOSIS — F251 Schizoaffective disorder, depressive type: Secondary | ICD-10-CM | POA: Diagnosis not present

## 2022-06-24 NOTE — Progress Notes (Signed)
Patient calm and pleasant during assessment denying SI/HI/AVH. Pt observed interacting appropriately with staff and peers on the unit. Pt compliant with medication administration per MD orders. Pt given education, support, and encouragement to be active in her treatment plan. Pt being monitored Q 15 minutes for safety per unit protocol, remains safe on the unit   

## 2022-06-24 NOTE — Progress Notes (Signed)
[facility] MD Progress Note  06/24/2022 11:07 AM [name]  MRN:  [id number] Subjective: Patient seen for follow-up.  61 year old woman with schizoaffective disorder.  Patient continues to be at the same mental state we have been seeing pretty much all along.  Mostly withdrawn.  Eats adequately and takes care of herself adequately but does not participate in anything therapeutic and never seems to be enjoying her self.  Attempted to have conversation with her and as always it just irritated her when I brought up the subject of her family. Principal Problem: Schizoaffective disorder, depressive type (HCC) Diagnosis: Principal Problem:   Schizoaffective disorder, depressive type (HCC)  Total Time spent with patient: 20 minutes  Past Psychiatric History: Past history of schizoaffective disorder  Past Medical History:  Past Medical History:  Diagnosis Date   Anemia 10/02/2021   Non compliance w medication regimen    Schizophrenia [facility])     Past Surgical History:  Procedure Laterality Date   BIOPSY  09/25/2021   Procedure: BIOPSY;  Surgeon: Rachael Fee, MD;  Location: [facility];  Service: Gastroenterology;;   ESOPHAGOGASTRODUODENOSCOPY (EGD) WITH PROPOFOL [name] 09/25/2021   Procedure: ESOPHAGOGASTRODUODENOSCOPY (EGD) WITH PROPOFOL;  Surgeon: Rachael Fee, MD;  Location: [facility];  Service: Gastroenterology;  Laterality: [name];  With NGT placement   Family History: History reviewed. No pertinent family history. Family Psychiatric  History: See previous Social History:  Social History   Substance and Sexual Activity  Alcohol Use Not Currently   Comment: Refuses to disclose how much     Social History   Substance and Sexual Activity  Drug Use No   Comment: Refuses to answer    Social History   Socioeconomic History   Marital status: Single    Spouse name: Not on file   Number of children: Not on file   Years of education: Not on file   Highest education level: Not on file   Occupational History   Not on file  Tobacco Use   Smoking status: Every Day    Packs/day: 2.00    Types: Cigarettes   Smokeless tobacco: Never  Substance and Sexual Activity   Alcohol use: Not Currently    Comment: Refuses to disclose how much   Drug use: No    Comment: Refuses to answer   Sexual activity: Not Currently    Comment: refused to answer  Other Topics Concern   Not on file  Social History Narrative   Not on file   Social Determinants of Health   Financial Resource Strain: Not on file  Food Insecurity: No Food Insecurity (04/08/2022)   Hunger Vital Sign    Worried About Running Out of Food in the Last Year: Never true    Ran Out of Food in the Last Year: Never true  Transportation Needs: No Transportation Needs (04/08/2022)   PRAPARE - Administrator, Civil Service (Medical): No    Lack of Transportation (Non-Medical): No  Physical Activity: Not on file  Stress: Not on file  Social Connections: Not on file   Additional Social History:                         Sleep: Fair  Appetite:  Fair  Current Medications: Current Facility-Administered Medications  Medication Dose Route Frequency Provider Last Rate Last Admin   acetaminophen (TYLENOL) tablet 650 mg  650 mg Oral Q6H PRN Nichalos Brenton, [name], MD       alum &  mag hydroxide-simeth (MAALOX/MYLANTA) 200-200-20 MG/5ML suspension 30 mL  30 mL Oral Q4H PRN Jennette Leask T, MD       haloperidol (HALDOL) tablet 5 mg  5 mg Oral BID Dany Walther T, MD   5 mg at 06/24/22 [id number]   Or   haloperidol lactate (HALDOL) injection 5 mg  5 mg Intramuscular BID Lineth Thielke T, MD       lithium carbonate capsule 600 mg  600 mg Oral QHS Julianne Chamberlin T, MD   600 mg at 06/23/22 2110   LORazepam (ATIVAN) tablet 1 mg  1 mg Oral BID Rahsaan Weakland T, MD   1 mg at 06/24/22 0813   Or   LORazepam (ATIVAN) injection 1 mg  1 mg Intramuscular BID Sumaiya Arruda T, MD       magnesium hydroxide (MILK OF MAGNESIA) suspension  30 mL  30 mL Oral Daily PRN Ladana Chavero T, MD       mirtazapine (REMERON) tablet 30 mg  30 mg Oral QHS Thoma Paulsen T, MD   30 mg at 06/23/22 2110   multivitamin with minerals tablet 1 tablet  1 tablet Oral Daily Haide Klinker, [name], MD   1 tablet at 06/24/22 0813   QUEtiapine (SEROQUEL) tablet 400 mg  400 mg Oral QHS Maureen Duesing T, MD   400 mg at 06/23/22 2110   thiamine (VITAMIN B1) tablet 100 mg  100 mg Oral Daily Jenipher Havel, [name], MD   100 mg at 06/24/22 0813    Lab Results: No results found for this or any previous visit (from the past 15 hour(s)).  Blood Alcohol level:  Lab Results  Component Value Date   ETH <10 03/29/2022   ETH <10 123XX123    Metabolic Disorder Labs: Lab Results  Component Value Date   HGBA1C 5.6 10/21/2021   MPG 114 10/21/2021   MPG 111 10/02/2021   Lab Results  Component Value Date   PROLACTIN 13.6 04/16/2015   Lab Results  Component Value Date   CHOL 189 10/21/2021   TRIG 52 10/21/2021   HDL 50 10/21/2021   CHOLHDL 3.8 10/21/2021   VLDL 10 10/21/2021   LDLCALC 129 (H) 10/21/2021   LDLCALC 93 10/01/2020    Physical Findings: AIMS: Facial and Oral Movements Muscles of Facial Expression: None, normal Lips and Perioral Area: None, normal Jaw: None, normal Tongue: None, normal,Extremity Movements Upper (arms, wrists, hands, fingers): None, normal Lower (legs, knees, ankles, toes): None, normal, Trunk Movements Neck, shoulders, hips: None, normal, Overall Severity Severity of abnormal movements (highest score from questions above): None, normal Incapacitation due to abnormal movements: None, normal Patient's awareness of abnormal movements (rate only patient's report): No Awareness, Dental Status Current problems with teeth and/or dentures?: No Does patient usually wear dentures?: No  CIWA:    COWS:     Musculoskeletal: Strength & Muscle Tone: within normal limits Gait & Station: normal Patient leans: [name]  Psychiatric Specialty  Exam:  Presentation  General Appearance:  Disheveled  Eye Contact: Fair  Speech: Pressured  Speech Volume: Increased  Handedness: Right   Mood and Affect  Mood: Irritable; Labile; Angry  Affect: Blunt; Labile   Thought Process  Thought Processes: Disorganized; Irrevelant  Descriptions of Associations:Loose  Orientation:Partial  Thought Content:Illogical; Rumination; Scattered; Tangential; Delusions  History of Schizophrenia/Schizoaffective disorder:Yes  Duration of Psychotic Symptoms:Greater than six months  Hallucinations:No data recorded Ideas of Reference:Percusatory; Paranoia  Suicidal Thoughts:No data recorded Homicidal Thoughts:No data recorded  Sensorium  Memory: Immediate Fair;  Recent Fair; Remote Fair  Judgment: Impaired  Insight: Poor; Lacking   Executive Functions  Concentration: Poor  Attention Span: Poor  Recall: Poor  Fund of Knowledge: Poor  Language: Poor   Psychomotor Activity  Psychomotor Activity:No data recorded  Assets  Assets: Physical Health; Resilience; Housing; Social Support   Sleep  Sleep:No data recorded   Physical Exam: Physical Exam Vitals reviewed.  Constitutional:      Appearance: Normal appearance.  HENT:     Head: Normocephalic and atraumatic.     Mouth/Throat:     Pharynx: Oropharynx is clear.  Eyes:     Pupils: Pupils are equal, round, and reactive to light.  Cardiovascular:     Rate and Rhythm: Normal rate and regular rhythm.  Pulmonary:     Effort: Pulmonary effort is normal.     Breath sounds: Normal breath sounds.  Abdominal:     General: Abdomen is flat.     Palpations: Abdomen is soft.  Musculoskeletal:        General: Normal range of motion.  Skin:    General: Skin is warm and dry.  Neurological:     General: No focal deficit present.     Mental Status: She is alert. Mental status is at baseline.  Psychiatric:        Attention and Perception: She is inattentive.         Mood and Affect: Mood normal. Affect is blunt.        Speech: Speech is tangential.        Behavior: Behavior is agitated. Behavior is not aggressive.        Thought Content: Thought content is paranoid and delusional.    Review of Systems  Constitutional: Negative.   HENT: Negative.    Eyes: Negative.   Respiratory: Negative.    Cardiovascular: Negative.   Gastrointestinal: Negative.   Musculoskeletal: Negative.   Skin: Negative.   Neurological: Negative.   Psychiatric/Behavioral: Negative.     Blood pressure 106/69, pulse (!) 56, temperature 98.3 F (36.8 C), temperature source Oral, resp. rate 16, height 5\' 4"  (1.626 m), weight 40.4 kg, SpO2 100 %. Body mass index is 15.28 kg/m.   Treatment Plan Summary: Plan to clarify, patient has chronic psychotic condition and is still having paranoia and delusions but does not show any sign of being acutely dangerous to herself or to others.  Although she denies the existence of her family she is not making any physical threats to them.  I am told that she does have a legal guardian and we have requested that they take her home as we feel that she has reached maximum benefit inpatient.  They have been declining to do so and we are at something of a standoff.  I have tried to talk with the patient about the possibility of using clozapine as an antipsychotic which might have the potential to improve her condition but she has adamantly refused it and refused blood draws making that avenue impossible for now.  Alethia Berthold, MD 06/24/2022, 11:07 AM

## 2022-06-24 NOTE — Progress Notes (Signed)
Recreation Therapy Notes    Date: 06/24/2022  Time: 11:00 am     Location: Craft room   Behavioral response: [name]   Intervention Topic: Time Management   Discussion/Intervention: Patient refused to attend group.   Clinical Observations/Feedback:  Patient refused to attend group.    Carlyon Nolasco LRT/CTRS        Samuel Rittenhouse 06/24/2022 12:39 PM

## 2022-06-24 NOTE — Group Note (Signed)
[facility] LCSW Group Therapy Note   Group Date: 06/24/2022 Start Time: 1300 End Time: 1400   Type of Therapy/Topic:  Group Therapy:  Emotion Regulation  Participation Level:  Did Not Attend    Description of Group:    The purpose of this group is to assist patients in learning to regulate negative emotions and experience positive emotions. Patients will be guided to discuss ways in which they have been vulnerable to their negative emotions. These vulnerabilities will be juxtaposed with experiences of positive emotions or situations, and patients challenged to use positive emotions to combat negative ones. Special emphasis will be placed on coping with negative emotions in conflict situations, and patients will process healthy conflict resolution skills.  Therapeutic Goals: Patient will identify two positive emotions or experiences to reflect on in order to balance out negative emotions:  Patient will label two or more emotions that they find the most difficult to experience:  Patient will be able to demonstrate positive conflict resolution skills through discussion or role plays:   Summary of Patient Progress: Patient declined to attend despite invitation.   Therapeutic Modalities:   Cognitive Behavioral Therapy Feelings Identification Dialectical Behavioral Therapy   Kimblery Diop R Kashira Behunin, LCSW 

## 2022-06-24 NOTE — BH IP Treatment Plan (Signed)
Interdisciplinary Treatment and Diagnostic Plan Update  06/24/2022 Time of Session: 0830 [name] MRN: [id number]  Principal Diagnosis: Schizoaffective disorder, depressive type [facility])  Secondary Diagnoses: Principal Problem:   Schizoaffective disorder, depressive type (HCC)   Current Medications:  Current Facility-Administered Medications  Medication Dose Route Frequency Provider Last Rate Last Admin   acetaminophen (TYLENOL) tablet 650 mg  650 mg Oral Q6H PRN [name], [name], MD       alum & mag hydroxide-simeth (MAALOX/MYLANTA) 200-200-20 MG/5ML suspension 30 mL  30 mL Oral Q4H PRN [name], [name], MD       haloperidol (HALDOL) tablet 5 mg  5 mg Oral BID [name], [name], MD   5 mg at 06/24/22 5188   Or   haloperidol lactate (HALDOL) injection 5 mg  5 mg Intramuscular BID [name], [name], MD       lithium carbonate capsule 600 mg  600 mg Oral QHS [name], [name], MD   600 mg at 06/23/22 2110   LORazepam (ATIVAN) tablet 1 mg  1 mg Oral BID [name], [name], MD   1 mg at 06/24/22 0813   Or   LORazepam (ATIVAN) injection 1 mg  1 mg Intramuscular BID [name], [name], MD       magnesium hydroxide (MILK OF MAGNESIA) suspension 30 mL  30 mL Oral Daily PRN [name], [name], MD       mirtazapine (REMERON) tablet 30 mg  30 mg Oral QHS [name], [name], MD   30 mg at 06/23/22 2110   multivitamin with minerals tablet 1 tablet  1 tablet Oral Daily [name], [name], MD   1 tablet at 06/24/22 0813   QUEtiapine (SEROQUEL) tablet 400 mg  400 mg Oral QHS [name], [name], MD   400 mg at 06/23/22 2110   thiamine (VITAMIN B1) tablet 100 mg  100 mg Oral Daily [name], [name], MD   100 mg at 06/24/22 0813   PTA Medications: Medications Prior to Admission  Medication Sig Dispense Refill Last Dose   clonazePAM (KLONOPIN) 0.5 MG tablet Take 0.5 tablets (0.25 mg total) by mouth 2 (two) times daily. (Patient not taking: Reported on 03/30/2022) 60 tablet 0    haloperidol (HALDOL) 5 MG tablet Take 1 tablet (5 mg  total) by mouth 2 (two) times daily. (Patient not taking: Reported on 03/30/2022) 60 tablet 3    midodrine (PROAMATINE) 2.5 MG tablet Take 1 tablet (2.5 mg total) by mouth 3 (three) times daily with meals. (Patient not taking: Reported on 03/30/2022)  0    mirtazapine (REMERON) 15 MG tablet Take 0.5 tablets (7.5 mg total) by mouth at bedtime. (Patient not taking: Reported on 03/30/2022) 30 tablet 2    Multiple Vitamin (MULTIVITAMIN WITH MINERALS) TABS tablet Take 1 tablet by mouth daily. (Patient not taking: Reported on 03/30/2022)      OLANZapine (ZYPREXA) 5 MG tablet Take 1 tablet (5 mg total) by mouth at bedtime. (Patient not taking: Reported on 03/30/2022) 30 tablet 2    thiamine 100 MG tablet Take 1 tablet (100 mg total) by mouth daily. (Patient not taking: Reported on 03/30/2022)       Patient Stressors: Marital or family conflict    Patient Strengths: Supportive family/friends   Treatment Modalities: Medication Management, Group therapy, Case management,  1 to 1 session with clinician, Psychoeducation, Recreational therapy.   Physician Treatment Plan for Primary Diagnosis: Schizoaffective disorder, depressive type (HCC) Long Term Goal(s): Improvement in symptoms so as ready for  discharge   Short Term Goals: Compliance with prescribed medications will improve Ability to verbalize feelings will improve Ability to demonstrate self-control will improve Ability to identify and develop effective coping behaviors will improve  Medication Management: Evaluate patient's response, side effects, and tolerance of medication regimen.  Therapeutic Interventions: 1 to 1 sessions, Unit Group sessions and Medication administration.  Evaluation of Outcomes: Adequate for Discharge  Physician Treatment Plan for Secondary Diagnosis: Principal Problem:   Schizoaffective disorder, depressive type (HCC)  Long Term Goal(s): Improvement in symptoms so as ready for discharge   Short Term Goals: Compliance  with prescribed medications will improve Ability to verbalize feelings will improve Ability to demonstrate self-control will improve Ability to identify and develop effective coping behaviors will improve     Medication Management: Evaluate patient's response, side effects, and tolerance of medication regimen.  Therapeutic Interventions: 1 to 1 sessions, Unit Group sessions and Medication administration.  Evaluation of Outcomes: Adequate for Discharge   RN Treatment Plan for Primary Diagnosis: Schizoaffective disorder, depressive type (HCC) Long Term Goal(s): Knowledge of disease and therapeutic regimen to maintain health will improve  Short Term Goals: Ability to remain free from injury will improve, Ability to verbalize frustration and anger appropriately will improve, Ability to demonstrate self-control, Ability to participate in decision making will improve, Ability to verbalize feelings will improve, Ability to disclose and discuss suicidal ideas, Ability to identify and develop effective coping behaviors will improve, and Compliance with prescribed medications will improve  Medication Management: RN will administer medications as ordered by provider, will assess and evaluate patient's response and provide education to patient for prescribed medication. RN will report any adverse and/or side effects to prescribing provider.  Therapeutic Interventions: 1 on 1 counseling sessions, Psychoeducation, Medication administration, Evaluate responses to treatment, Monitor vital signs and CBGs as ordered, Perform/monitor CIWA, COWS, AIMS and Fall Risk screenings as ordered, Perform wound care treatments as ordered.  Evaluation of Outcomes: Adequate for Discharge   LCSW Treatment Plan for Primary Diagnosis: Schizoaffective disorder, depressive type (HCC) Long Term Goal(s): Safe transition to appropriate next level of care at discharge, Engage patient in therapeutic group addressing interpersonal  concerns.  Short Term Goals: Engage patient in aftercare planning with referrals and resources, Increase social support, Increase ability to appropriately verbalize feelings, Increase emotional regulation, Facilitate acceptance of mental health diagnosis and concerns, Facilitate patient progression through stages of change regarding substance use diagnoses and concerns, Identify triggers associated with mental health/substance abuse issues, and Increase skills for wellness and recovery  Therapeutic Interventions: Assess for all discharge needs, 1 to 1 time with Social worker, Explore available resources and support systems, Assess for adequacy in community support network, Educate family and significant other(s) on suicide prevention, Complete Psychosocial Assessment, Interpersonal group therapy.  Evaluation of Outcomes: Adequate for Discharge   Progress in Treatment: Attending groups: No. Participating in groups: No. Taking medication as prescribed: Yes. Toleration medication: Yes. Family/Significant other contact made: Yes, individual(s) contacted:  legal guardian, Virgene Tirone, [phone] Patient understands diagnosis: No. Discussing patient identified problems/goals with staff: Yes. Medical problems stabilized or resolved: Yes. Denies suicidal/homicidal ideation: No. Issues/concerns per patient self-inventory: Yes. Other: none  New problem(s) identified: No, Describe:  none  New Short Term/Long Term Goal(s): Patient to work towards elimination of symptoms of psychosis, medication management for mood stabilization; elimination of SI thoughts; development of comprehensive mental wellness plan.  Patient Goals:  No additional goals identified at this time. Patient to continue to work towards original goals identified  in initial treatment team meeting. CSW will remain available to patient should they voice additional treatment goals.   Discharge Plan or Barriers: Patient lacks  adequate housing/supervision at discharge. Patient's guardian refuses to pick patient up from hospital out of safety concerns. CSW team continuing to work with legal guardian to attempt to resolve concerns and identify adequate discharge plan.   Reason for Continuation of Hospitalization: Other; describe Patient is psychiatrically clear. Patient is currently boarding.   Estimated Length of Stay: TBD   Scribe for Treatment Team: Almedia Balls 06/24/2022 11:59 AM

## 2022-06-24 NOTE — Progress Notes (Signed)
Pt denies SI/HI/AVH and verbally agrees to approach staff if these become apparent or before harming themselves/others. Rates depression 0/10. Rates anxiety 0/10. Rates pain 0/10.   Scheduled medications administered to pt, per MD orders. RN provided support and encouragement to pt. Q15 min safety checks implemented and continued. Pt safe on the unit. RN will continue to monitor and intervene as needed.  06/24/22 0813  Psych Admission Type (Psych Patients Only)  Admission Status Voluntary  Psychosocial Assessment  Patient Complaints None  Eye Contact Fair  Facial Expression Flat  Affect Blunted  Speech Logical/coherent  Interaction Isolative;Minimal  Motor Activity Slow  Appearance/Hygiene Unremarkable;In scrubs  Behavior Characteristics Cooperative;Appropriate to situation;Calm  Mood Pleasant  Thought Process  Coherency WDL  Content WDL  Delusions None reported or observed  Perception WDL  Hallucination None reported or observed  Judgment Limited  Confusion None  Danger to Self  Current suicidal ideation? Denies  Danger to Others  Danger to Others None reported or observed

## 2022-06-25 DIAGNOSIS — F251 Schizoaffective disorder, depressive type: Secondary | ICD-10-CM | POA: Diagnosis not present

## 2022-06-25 NOTE — Progress Notes (Signed)
Recreation Therapy Notes  Date: 06/25/2022  Time: 10:15 am     Location: Craft room   Behavioral response: [name]   Intervention Topic: Relaxation   Discussion/Intervention: Patient refused to attend group.   Clinical Observations/Feedback:  Patient refused to attend group.    [name] LRT/CTRS        [name] 06/25/2022 12:03 PM 

## 2022-06-25 NOTE — Plan of Care (Signed)
  Problem: Safety: Goal: Ability to remain free from injury will improve Outcome: Progressing   Problem: Health Behavior/Discharge Planning: Goal: Compliance with prescribed medication regimen will improve Outcome: Progressing   Problem: Education: Goal: Mental status will improve Outcome: Progressing

## 2022-06-25 NOTE — Progress Notes (Signed)
[facility] MD Progress Note  06/25/2022 11:20 AM [name]  MRN:  [id number] Subjective: Patient seen and chart reviewed.  Patient continues to have no specific complaint other than requesting discharge.  Remains isolated in her room.  Grumpy and argumentative.  I even tried to engage her in conversation about whether she would like to have some more clothing and she declined that just out of general irritability as far as I could tell. Principal Problem: Schizoaffective disorder, depressive type (HCC) Diagnosis: Principal Problem:   Schizoaffective disorder, depressive type (HCC)  Total Time spent with patient: 20 minutes  Past Psychiatric History: Past history of schizophrenia  Past Medical History:  Past Medical History:  Diagnosis Date   Anemia 10/02/2021   Non compliance w medication regimen    Schizophrenia [facility])     Past Surgical History:  Procedure Laterality Date   BIOPSY  09/25/2021   Procedure: BIOPSY;  Surgeon: Rachael Fee, MD;  Location: [facility];  Service: Gastroenterology;;   ESOPHAGOGASTRODUODENOSCOPY (EGD) WITH PROPOFOL [name] 09/25/2021   Procedure: ESOPHAGOGASTRODUODENOSCOPY (EGD) WITH PROPOFOL;  Surgeon: Rachael Fee, MD;  Location: [facility];  Service: Gastroenterology;  Laterality: [name];  With NGT placement   Family History: History reviewed. No pertinent family history. Family Psychiatric  History: See previous Social History:  Social History   Substance and Sexual Activity  Alcohol Use Not Currently   Comment: Refuses to disclose how much     Social History   Substance and Sexual Activity  Drug Use No   Comment: Refuses to answer    Social History   Socioeconomic History   Marital status: Single    Spouse name: Not on file   Number of children: Not on file   Years of education: Not on file   Highest education level: Not on file  Occupational History   Not on file  Tobacco Use   Smoking status: Every Day    Packs/day: 2.00    Types:  Cigarettes   Smokeless tobacco: Never  Substance and Sexual Activity   Alcohol use: Not Currently    Comment: Refuses to disclose how much   Drug use: No    Comment: Refuses to answer   Sexual activity: Not Currently    Comment: refused to answer  Other Topics Concern   Not on file  Social History Narrative   Not on file   Social Determinants of Health   Financial Resource Strain: Not on file  Food Insecurity: No Food Insecurity (04/08/2022)   Hunger Vital Sign    Worried About Running Out of Food in the Last Year: Never true    Ran Out of Food in the Last Year: Never true  Transportation Needs: No Transportation Needs (04/08/2022)   PRAPARE - Administrator, Civil Service (Medical): No    Lack of Transportation (Non-Medical): No  Physical Activity: Not on file  Stress: Not on file  Social Connections: Not on file   Additional Social History:                         Sleep: Fair  Appetite:  Fair  Current Medications: Current Facility-Administered Medications  Medication Dose Route Frequency Provider Last Rate Last Admin   acetaminophen (TYLENOL) tablet 650 mg  650 mg Oral Q6H PRN Karmon Andis T, MD       alum & mag hydroxide-simeth (MAALOX/MYLANTA) 200-200-20 MG/5ML suspension 30 mL  30 mL Oral Q4H PRN Valentin Benney T,  MD       haloperidol (HALDOL) tablet 5 mg  5 mg Oral BID Sidonie Dexheimer, [name], MD   5 mg at 06/25/22 3491   Or   haloperidol lactate (HALDOL) injection 5 mg  5 mg Intramuscular BID Advaith Lamarque T, MD       lithium carbonate capsule 600 mg  600 mg Oral QHS Dannell Gortney T, MD   600 mg at 06/24/22 2107   LORazepam (ATIVAN) tablet 1 mg  1 mg Oral BID Trinnity Breunig T, MD   1 mg at 06/25/22 7915   Or   LORazepam (ATIVAN) injection 1 mg  1 mg Intramuscular BID Desarie Feild T, MD       magnesium hydroxide (MILK OF MAGNESIA) suspension 30 mL  30 mL Oral Daily PRN Alaija Ruble T, MD       mirtazapine (REMERON) tablet 30 mg  30 mg Oral QHS  Augustina Braddock T, MD   30 mg at 06/24/22 2107   multivitamin with minerals tablet 1 tablet  1 tablet Oral Daily Amberrose Friebel, [name], MD   1 tablet at 06/25/22 0817   QUEtiapine (SEROQUEL) tablet 400 mg  400 mg Oral QHS Loral Campi T, MD   400 mg at 06/24/22 2107   thiamine (VITAMIN B1) tablet 100 mg  100 mg Oral Daily Mong Neal, [name], MD   100 mg at 06/25/22 0569    Lab Results: No results found for this or any previous visit (from the past 48 hour(s)).  Blood Alcohol level:  Lab Results  Component Value Date   ETH <10 03/29/2022   ETH <10 09/12/2021    Metabolic Disorder Labs: Lab Results  Component Value Date   HGBA1C 5.6 10/21/2021   MPG 114 10/21/2021   MPG 111 10/02/2021   Lab Results  Component Value Date   PROLACTIN 13.6 04/16/2015   Lab Results  Component Value Date   CHOL 189 10/21/2021   TRIG 52 10/21/2021   HDL 50 10/21/2021   CHOLHDL 3.8 10/21/2021   VLDL 10 10/21/2021   LDLCALC 129 (H) 10/21/2021   LDLCALC 93 10/01/2020    Physical Findings: AIMS: Facial and Oral Movements Muscles of Facial Expression: None, normal Lips and Perioral Area: None, normal Jaw: None, normal Tongue: None, normal,Extremity Movements Upper (arms, wrists, hands, fingers): None, normal Lower (legs, knees, ankles, toes): None, normal, Trunk Movements Neck, shoulders, hips: None, normal, Overall Severity Severity of abnormal movements (highest score from questions above): None, normal Incapacitation due to abnormal movements: None, normal Patient's awareness of abnormal movements (rate only patient's report): No Awareness, Dental Status Current problems with teeth and/or dentures?: No Does patient usually wear dentures?: No  CIWA:    COWS:     Musculoskeletal: Strength & Muscle Tone: within normal limits Gait & Station: normal Patient leans: [name]  Psychiatric Specialty Exam:  Presentation  General Appearance:  Disheveled  Eye Contact: Fair  Speech: Pressured  Speech  Volume: Increased  Handedness: Right   Mood and Affect  Mood: Irritable; Labile; Angry  Affect: Blunt; Labile   Thought Process  Thought Processes: Disorganized; Irrevelant  Descriptions of Associations:Loose  Orientation:Partial  Thought Content:Illogical; Rumination; Scattered; Tangential; Delusions  History of Schizophrenia/Schizoaffective disorder:Yes  Duration of Psychotic Symptoms:Greater than six months  Hallucinations:No data recorded Ideas of Reference:Percusatory; Paranoia  Suicidal Thoughts:No data recorded Homicidal Thoughts:No data recorded  Sensorium  Memory: Immediate Fair; Recent Fair; Remote Fair  Judgment: Impaired  Insight: Poor; Lacking   Executive Functions  Concentration:  Poor  Attention Span: Poor  Recall: Poor  Fund of Knowledge: Poor  Language: Poor   Psychomotor Activity  Psychomotor Activity:No data recorded  Assets  Assets: Physical Health; Resilience; Housing; Social Support   Sleep  Sleep:No data recorded   Physical Exam: Physical Exam Vitals reviewed.  Constitutional:      Appearance: Normal appearance.  HENT:     Head: Normocephalic and atraumatic.     Mouth/Throat:     Pharynx: Oropharynx is clear.  Eyes:     Pupils: Pupils are equal, round, and reactive to light.  Cardiovascular:     Rate and Rhythm: Normal rate and regular rhythm.  Pulmonary:     Effort: Pulmonary effort is normal.     Breath sounds: Normal breath sounds.  Abdominal:     General: Abdomen is flat.     Palpations: Abdomen is soft.  Musculoskeletal:        General: Normal range of motion.  Skin:    General: Skin is warm and dry.  Neurological:     General: No focal deficit present.     Mental Status: She is alert. Mental status is at baseline.  Psychiatric:        Attention and Perception: She is inattentive.        Mood and Affect: Mood normal. Affect is blunt.        Speech: Speech is delayed.        Behavior:  Behavior is slowed.        Thought Content: Thought content is paranoid.    Review of Systems  Constitutional: Negative.   HENT: Negative.    Eyes: Negative.   Respiratory: Negative.    Cardiovascular: Negative.   Gastrointestinal: Negative.   Musculoskeletal: Negative.   Skin: Negative.   Neurological: Negative.   Psychiatric/Behavioral: Negative.     Blood pressure 108/69, pulse (!) 54, temperature 97.8 F (36.6 C), temperature source Oral, resp. rate 16, height 5\' 4"  (1.626 m), weight 40.4 kg, SpO2 100 %. Body mass index is 15.28 kg/m.   Treatment Plan Summary: Plan we feel that the patient has had about as much improvement that she is likely to get from inpatient hospitalization and recommended discharge.  Patient is declining a chance to start Clozapine treatment which would be the only thing clinically that I think had any chance of improving her mental condition.  Patient at this point appears to be safe for discharge back home but family is declining to take her.  , MD 06/25/2022, 11:20 AM

## 2022-06-25 NOTE — Progress Notes (Signed)
Recreation Therapy Notes   Date: 06/25/2022  Time: 2:15pm  Location: Courtyard   Behavioral response: [name]   Group Type: Recreation and Leisure  Participation level: [name]  Communication: Patient refused to attend group.   Comments: [name]  Ellana Kawa LRT/CTRS        Amai Cappiello 06/25/2022 3:56 PM 

## 2022-06-25 NOTE — Progress Notes (Signed)
Patient A&Ox4. Patient observed eating breakfast this morning. Patient denies any pain or discomfort. Patient denies SI,HI, and A/V/H with no plan/intent. Med compliant. Patient verbalized no further concerns other than discharge. Patient remains cooperative and safe on unit.     06/25/22 0815  Psych Admission Type (Psych Patients Only)  Admission Status Voluntary  Psychosocial Assessment  Patient Complaints None  Eye Contact Fair  Facial Expression Flat  Affect Flat  Speech Logical/coherent  Interaction Minimal  Motor Activity Slow  Appearance/Hygiene Unremarkable;In scrubs  Behavior Characteristics Cooperative;Calm  Mood Pleasant  Thought Process  Coherency WDL  Content WDL  Delusions None reported or observed  Perception WDL  Hallucination None reported or observed  Judgment Limited  Confusion None  Danger to Self  Current suicidal ideation? Denies  Agreement Not to Harm Self Yes  Description of Agreement verbally contracts for safety  Danger to Others  Danger to Others None reported or observed

## 2022-06-25 NOTE — Plan of Care (Signed)
Patient calm and cooperative, isolative at times, no complaints of distress; medication compliant.  Denies SI HI AVH.  Denies pain.  Continued monitoring for safety via q 15 minute checks.

## 2022-06-25 NOTE — Group Note (Signed)
[facility] LCSW Group Therapy Note   Group Date: 06/25/2022 Start Time: 1300 End Time: 1400   Type of Therapy/Topic:  Group Therapy:  Balance in Life  Participation Level:  Did Not Attend   Description of Group:    This group will address the concept of balance and how it feels and looks when one is unbalanced. Patients will be encouraged to process areas in their lives that are out of balance, and identify reasons for remaining unbalanced. Facilitators will guide patients utilizing problem- solving interventions to address and correct the stressor making their life unbalanced. Understanding and applying boundaries will be explored and addressed for obtaining  and maintaining a balanced life. Patients will be encouraged to explore ways to assertively make their unbalanced needs known to significant others in their lives, using other group members and facilitator for support and feedback.  Therapeutic Goals: Patient will identify two or more emotions or situations they have that consume much of in their lives. Patient will identify signs/triggers that life has become out of balance:  Patient will identify two ways to set boundaries in order to achieve balance in their lives:  Patient will demonstrate ability to communicate their needs through discussion and/or role plays  Summary of Patient Progress:    Group unable to be held due to complex discharges and discharges planning. CSW provided patient with worksheets.    Therapeutic Modalities:   Cognitive Behavioral Therapy Solution-Focused Therapy Assertiveness Training   Candid Bovey J Kadrian Partch, LCSW 

## 2022-06-26 DIAGNOSIS — F251 Schizoaffective disorder, depressive type: Secondary | ICD-10-CM | POA: Diagnosis not present

## 2022-06-26 NOTE — [facility] Counselor (Signed)
CSW spoke with the patient sister/guardian.  She continues to report that the patient can NOT return to the home.  She reports that the patient's mother remains sick and people are still "coming and going from the home". She reports that this was a trigger for the patient and she does not think that the patient will do well once returned.  CSW followed up on previous reports that guardian would obtain special assistance Medicaid.  Guardian reports that she has NOT file the paperwork. She reports that she will take all documentation to Medicaid on 06/29/2022.  She reports that she will ask them at the time how long it may take for approval.  She reports that she will follow up with this CSW.  Guardian asked if "are you telling me that she is ready to be released so that I can turn around and have her IVCed again".  CSW explained that this is a follow up call to discuss barriers to patient being discharged.  Penni Homans, MSW, LCSW 06/26/2022 4:17 PM

## 2022-06-26 NOTE — Progress Notes (Signed)
Patient denies SI, HI, and AVH. She denies pain and other physical problems. Patient interacts minimally and isolates to her room. Patient remains safe on the unit at this time.

## 2022-06-26 NOTE — Progress Notes (Signed)
Recreation Therapy Notes  Date: 06/26/2022  Time: 10:45 am     Location: Craft room   Behavioral response: [name]   Intervention Topic: Leisure  Discussion/Intervention: Patient refused to attend group.   Clinical Observations/Feedback:  Patient refused to attend group.    Nila Winker LRT/CTRS        Valgene Deloatch 06/26/2022 12:43 PM

## 2022-06-26 NOTE — Group Note (Signed)

## 2022-06-26 NOTE — Progress Notes (Signed)
[facility] MD Progress Note  06/26/2022 3:06 PM [name]  MRN:  [id number] Subjective: Follow-up 61 year old woman with schizoaffective disorder.  No change in presentation.  Remains withdrawn and flat but gets irritable anytime you try and discuss discharge planning.  Cannot really engage in any back-and-forth conversation.  Continues to refuse to allow any new blood testing. Principal Problem: Schizoaffective disorder, depressive type (HCC) Diagnosis: Principal Problem:   Schizoaffective disorder, depressive type (HCC)  Total Time spent with patient: 20 minutes  Past Psychiatric History: Past history of schizophrenia or schizoaffective disorder  Past Medical History:  Past Medical History:  Diagnosis Date   Anemia 10/02/2021   Non compliance w medication regimen    Schizophrenia [facility])     Past Surgical History:  Procedure Laterality Date   BIOPSY  09/25/2021   Procedure: BIOPSY;  Surgeon: Rachael Fee, MD;  Location: [facility];  Service: Gastroenterology;;   ESOPHAGOGASTRODUODENOSCOPY (EGD) WITH PROPOFOL [name] 09/25/2021   Procedure: ESOPHAGOGASTRODUODENOSCOPY (EGD) WITH PROPOFOL;  Surgeon: Rachael Fee, MD;  Location: [facility];  Service: Gastroenterology;  Laterality: [name];  With NGT placement   Family History: History reviewed. No pertinent family history. Family Psychiatric  History: See previous Social History:  Social History   Substance and Sexual Activity  Alcohol Use Not Currently   Comment: Refuses to disclose how much     Social History   Substance and Sexual Activity  Drug Use No   Comment: Refuses to answer    Social History   Socioeconomic History   Marital status: Single    Spouse name: Not on file   Number of children: Not on file   Years of education: Not on file   Highest education level: Not on file  Occupational History   Not on file  Tobacco Use   Smoking status: Every Day    Packs/day: 2.00    Types: Cigarettes   Smokeless tobacco:  Never  Substance and Sexual Activity   Alcohol use: Not Currently    Comment: Refuses to disclose how much   Drug use: No    Comment: Refuses to answer   Sexual activity: Not Currently    Comment: refused to answer  Other Topics Concern   Not on file  Social History Narrative   Not on file   Social Determinants of Health   Financial Resource Strain: Not on file  Food Insecurity: No Food Insecurity (04/08/2022)   Hunger Vital Sign    Worried About Running Out of Food in the Last Year: Never true    Ran Out of Food in the Last Year: Never true  Transportation Needs: No Transportation Needs (04/08/2022)   PRAPARE - Administrator, Civil Service (Medical): No    Lack of Transportation (Non-Medical): No  Physical Activity: Not on file  Stress: Not on file  Social Connections: Not on file   Additional Social History:                         Sleep: Fair  Appetite:  Fair  Current Medications: Current Facility-Administered Medications  Medication Dose Route Frequency Provider Last Rate Last Admin   acetaminophen (TYLENOL) tablet 650 mg  650 mg Oral Q6H PRN Sanjuana Mruk T, MD       alum & mag hydroxide-simeth (MAALOX/MYLANTA) 200-200-20 MG/5ML suspension 30 mL  30 mL Oral Q4H PRN Subhan Hoopes T, MD       haloperidol (HALDOL) tablet 5 mg  5  mg Oral BID Doyle Kunath, [name], MD   5 mg at 06/26/22 4259   Or   haloperidol lactate (HALDOL) injection 5 mg  5 mg Intramuscular BID Timohty Renbarger T, MD       lithium carbonate capsule 600 mg  600 mg Oral QHS Lipa Knauff T, MD   600 mg at 06/25/22 2111   LORazepam (ATIVAN) tablet 1 mg  1 mg Oral BID Jere Bostrom T, MD   1 mg at 06/26/22 5638   Or   LORazepam (ATIVAN) injection 1 mg  1 mg Intramuscular BID Mccall Lomax T, MD       magnesium hydroxide (MILK OF MAGNESIA) suspension 30 mL  30 mL Oral Daily PRN [name] T, MD       mirtazapine (REMERON) tablet 30 mg  30 mg Oral QHS Kortne All T, MD   30 mg at 06/25/22  2111   multivitamin with minerals tablet 1 tablet  1 tablet Oral Daily Addalee Kavanagh, [name], MD   1 tablet at 06/26/22 0736   QUEtiapine (SEROQUEL) tablet 400 mg  400 mg Oral QHS Deedee Lybarger T, MD   400 mg at 06/25/22 2111   thiamine (VITAMIN B1) tablet 100 mg  100 mg Oral Daily Sheril Hammond, [name], MD   100 mg at 06/26/22 7564    Lab Results: No results found for this or any previous visit (from the past 48 hour(s)).  Blood Alcohol level:  Lab Results  Component Value Date   ETH <10 03/29/2022   ETH <10 09/12/2021    Metabolic Disorder Labs: Lab Results  Component Value Date   HGBA1C 5.6 10/21/2021   MPG 114 10/21/2021   MPG 111 10/02/2021   Lab Results  Component Value Date   PROLACTIN 13.6 04/16/2015   Lab Results  Component Value Date   CHOL 189 10/21/2021   TRIG 52 10/21/2021   HDL 50 10/21/2021   CHOLHDL 3.8 10/21/2021   VLDL 10 10/21/2021   LDLCALC 129 (H) 10/21/2021   LDLCALC 93 10/01/2020    Physical Findings: AIMS: Facial and Oral Movements Muscles of Facial Expression: None, normal Lips and Perioral Area: None, normal Jaw: None, normal Tongue: None, normal,Extremity Movements Upper (arms, wrists, hands, fingers): None, normal Lower (legs, knees, ankles, toes): None, normal, Trunk Movements Neck, shoulders, hips: None, normal, Overall Severity Severity of abnormal movements (highest score from questions above): None, normal Incapacitation due to abnormal movements: None, normal Patient's awareness of abnormal movements (rate only patient's report): No Awareness, Dental Status Current problems with teeth and/or dentures?: No Does patient usually wear dentures?: No  CIWA:    COWS:     Musculoskeletal: Strength & Muscle Tone: within normal limits Gait & Station: normal Patient leans: [name]  Psychiatric Specialty Exam:  Presentation  General Appearance:  Disheveled  Eye Contact: Fair  Speech: Pressured  Speech  Volume: Increased  Handedness: Right   Mood and Affect  Mood: Irritable; Labile; Angry  Affect: Blunt; Labile   Thought Process  Thought Processes: Disorganized; Irrevelant  Descriptions of Associations:Loose  Orientation:Partial  Thought Content:Illogical; Rumination; Scattered; Tangential; Delusions  History of Schizophrenia/Schizoaffective disorder:Yes  Duration of Psychotic Symptoms:Greater than six months  Hallucinations:No data recorded Ideas of Reference:Percusatory; Paranoia  Suicidal Thoughts:No data recorded Homicidal Thoughts:No data recorded  Sensorium  Memory: Immediate Fair; Recent Fair; Remote Fair  Judgment: Impaired  Insight: Poor; Lacking   Executive Functions  Concentration: Poor  Attention Span: Poor  Recall: Poor  Fund of Knowledge: Poor  Language: Poor   Psychomotor Activity  Psychomotor Activity:No data recorded  Assets  Assets: Physical Health; Resilience; Housing; Social Support   Sleep  Sleep:No data recorded   Physical Exam: Physical Exam Vitals and nursing note reviewed.  Constitutional:      Appearance: Normal appearance.  HENT:     Head: Normocephalic and atraumatic.     Mouth/Throat:     Pharynx: Oropharynx is clear.  Eyes:     Pupils: Pupils are equal, round, and reactive to light.  Cardiovascular:     Rate and Rhythm: Normal rate and regular rhythm.  Pulmonary:     Effort: Pulmonary effort is normal.     Breath sounds: Normal breath sounds.  Abdominal:     General: Abdomen is flat.     Palpations: Abdomen is soft.  Musculoskeletal:        General: Normal range of motion.  Skin:    General: Skin is warm and dry.  Neurological:     General: No focal deficit present.     Mental Status: She is alert. Mental status is at baseline.  Psychiatric:        Attention and Perception: She is inattentive.        Mood and Affect: Affect is blunt.        Speech: She is noncommunicative.         Thought Content: Thought content is paranoid.    Review of Systems  Constitutional: Negative.   HENT: Negative.    Eyes: Negative.   Respiratory: Negative.    Cardiovascular: Negative.   Gastrointestinal: Negative.   Musculoskeletal: Negative.   Skin: Negative.   Neurological: Negative.   Psychiatric/Behavioral: Negative.     Blood pressure 107/75, pulse 60, temperature 97.7 F (36.5 C), temperature source Oral, resp. rate 16, height 5\' 4"  (1.626 m), weight 40.4 kg, SpO2 100 %. Body mass index is 15.28 kg/m.   Treatment Plan Summary: Medication management and Plan no change to current treatment plan.  Daily reassessment and attempt to form some rapport.  Continue working on discharge planning  , MD 06/26/2022, 3:06 PM

## 2022-06-27 DIAGNOSIS — F251 Schizoaffective disorder, depressive type: Secondary | ICD-10-CM | POA: Diagnosis not present

## 2022-06-27 NOTE — Plan of Care (Signed)
Patient observed eating breakfast this morning. Cooperative, remains mostly to self, and med compliant. Denies pain. Denies SI,HI, and A/V/H with no plan/intent. Patient remains safe on unit.     Problem: Safety: Goal: Ability to remain free from injury will improve Outcome: Progressing   Problem: Coping: Goal: Coping ability will improve Outcome: Progressing

## 2022-06-27 NOTE — Progress Notes (Signed)
[facility] MD Progress Note  06/27/2022 2:54 PM [name]  MRN:  [id number] Subjective: Follow-up patient with schizoaffective disorder.  No new complaints.  Takes care of her ADLs and eats enough but does not participate much in other treatment and cannot engage in conversation about discharge Principal Problem: Schizoaffective disorder, depressive type (HCC) Diagnosis: Principal Problem:   Schizoaffective disorder, depressive type (HCC)  Total Time spent with patient: 30 minutes  Past Psychiatric History: Past history of schizoaffective disorder  Past Medical History:  Past Medical History:  Diagnosis Date   Anemia 10/02/2021   Non compliance w medication regimen    Schizophrenia (HCC)     Past Surgical History:  Procedure Laterality Date   BIOPSY  09/25/2021   Procedure: BIOPSY;  Surgeon: Rachael Fee, MD;  Location: [facility];  Service: Gastroenterology;;   ESOPHAGOGASTRODUODENOSCOPY (EGD) WITH PROPOFOL [name] 09/25/2021   Procedure: ESOPHAGOGASTRODUODENOSCOPY (EGD) WITH PROPOFOL;  Surgeon: Rachael Fee, MD;  Location: [facility];  Service: Gastroenterology;  Laterality: [name];  With NGT placement   Family History: History reviewed. No pertinent family history. Family Psychiatric  History: See previous Social History:  Social History   Substance and Sexual Activity  Alcohol Use Not Currently   Comment: Refuses to disclose how much     Social History   Substance and Sexual Activity  Drug Use No   Comment: Refuses to answer    Social History   Socioeconomic History   Marital status: Single    Spouse name: Not on file   Number of children: Not on file   Years of education: Not on file   Highest education level: Not on file  Occupational History   Not on file  Tobacco Use   Smoking status: Every Day    Packs/day: 2.00    Types: Cigarettes   Smokeless tobacco: Never  Substance and Sexual Activity   Alcohol use: Not Currently    Comment: Refuses to disclose how  much   Drug use: No    Comment: Refuses to answer   Sexual activity: Not Currently    Comment: refused to answer  Other Topics Concern   Not on file  Social History Narrative   Not on file   Social Determinants of Health   Financial Resource Strain: Not on file  Food Insecurity: No Food Insecurity (04/08/2022)   Hunger Vital Sign    Worried About Running Out of Food in the Last Year: Never true    Ran Out of Food in the Last Year: Never true  Transportation Needs: No Transportation Needs (04/08/2022)   PRAPARE - Administrator, Civil Service (Medical): No    Lack of Transportation (Non-Medical): No  Physical Activity: Not on file  Stress: Not on file  Social Connections: Not on file   Additional Social History:                         Sleep: Fair  Appetite:  Fair  Current Medications: Current Facility-Administered Medications  Medication Dose Route Frequency Provider Last Rate Last Admin   acetaminophen (TYLENOL) tablet 650 mg  650 mg Oral Q6H PRN Kailyn Vanderslice T, MD       alum & mag hydroxide-simeth (MAALOX/MYLANTA) 200-200-20 MG/5ML suspension 30 mL  30 mL Oral Q4H PRN Yasira Engelson T, MD       haloperidol (HALDOL) tablet 5 mg  5 mg Oral BID Shakim Faith, [name], MD   5 mg at 06/27/22  7353   Or   haloperidol lactate (HALDOL) injection 5 mg  5 mg Intramuscular BID Jerriann Schrom T, MD       lithium carbonate capsule 600 mg  600 mg Oral QHS Dacoda Spallone T, MD   600 mg at 06/26/22 2116   LORazepam (ATIVAN) tablet 1 mg  1 mg Oral BID Charmeka Freeburg T, MD   1 mg at 06/27/22 2992   Or   LORazepam (ATIVAN) injection 1 mg  1 mg Intramuscular BID Weslie Pretlow T, MD       magnesium hydroxide (MILK OF MAGNESIA) suspension 30 mL  30 mL Oral Daily PRN Nan Maya T, MD       mirtazapine (REMERON) tablet 30 mg  30 mg Oral QHS Charlestine Rookstool T, MD   30 mg at 06/26/22 2117   multivitamin with minerals tablet 1 tablet  1 tablet Oral Daily Giada Schoppe, [name], MD   1 tablet  at 06/27/22 0817   QUEtiapine (SEROQUEL) tablet 400 mg  400 mg Oral QHS Josi Roediger T, MD   400 mg at 06/26/22 2116   thiamine (VITAMIN B1) tablet 100 mg  100 mg Oral Daily Bingham Millette, [name], MD   100 mg at 06/27/22 4268    Lab Results: No results found for this or any previous visit (from the past 48 hour(s)).  Blood Alcohol level:  Lab Results  Component Value Date   ETH <10 03/29/2022   ETH <10 09/12/2021    Metabolic Disorder Labs: Lab Results  Component Value Date   HGBA1C 5.6 10/21/2021   MPG 114 10/21/2021   MPG 111 10/02/2021   Lab Results  Component Value Date   PROLACTIN 13.6 04/16/2015   Lab Results  Component Value Date   CHOL 189 10/21/2021   TRIG 52 10/21/2021   HDL 50 10/21/2021   CHOLHDL 3.8 10/21/2021   VLDL 10 10/21/2021   LDLCALC 129 (H) 10/21/2021   LDLCALC 93 10/01/2020    Physical Findings: AIMS: Facial and Oral Movements Muscles of Facial Expression: None, normal Lips and Perioral Area: None, normal Jaw: None, normal Tongue: None, normal,Extremity Movements Upper (arms, wrists, hands, fingers): None, normal Lower (legs, knees, ankles, toes): None, normal, Trunk Movements Neck, shoulders, hips: None, normal, Overall Severity Severity of abnormal movements (highest score from questions above): None, normal Incapacitation due to abnormal movements: None, normal Patient's awareness of abnormal movements (rate only patient's report): No Awareness, Dental Status Current problems with teeth and/or dentures?: No Does patient usually wear dentures?: No  CIWA:    COWS:     Musculoskeletal: Strength & Muscle Tone: within normal limits Gait & Station: normal Patient leans: [name]  Psychiatric Specialty Exam:  Presentation  General Appearance:  Disheveled  Eye Contact: Fair  Speech: Pressured  Speech Volume: Increased  Handedness: Right   Mood and Affect  Mood: Irritable; Labile; Angry  Affect: Blunt; Labile   Thought Process   Thought Processes: Disorganized; Irrevelant  Descriptions of Associations:Loose  Orientation:Partial  Thought Content:Illogical; Rumination; Scattered; Tangential; Delusions  History of Schizophrenia/Schizoaffective disorder:Yes  Duration of Psychotic Symptoms:Greater than six months  Hallucinations:No data recorded Ideas of Reference:Percusatory; Paranoia  Suicidal Thoughts:No data recorded Homicidal Thoughts:No data recorded  Sensorium  Memory: Immediate Fair; Recent Fair; Remote Fair  Judgment: Impaired  Insight: Poor; Lacking   Executive Functions  Concentration: Poor  Attention Span: Poor  Recall: Poor  Fund of Knowledge: Poor  Language: Poor   Psychomotor Activity  Psychomotor Activity:No data recorded  Assets  Assets: Physical Health; Resilience; Housing; Social Support   Sleep  Sleep:No data recorded   Physical Exam: Physical Exam Vitals and nursing note reviewed.  Constitutional:      Appearance: Normal appearance.  HENT:     Head: Normocephalic and atraumatic.     Mouth/Throat:     Pharynx: Oropharynx is clear.  Eyes:     Pupils: Pupils are equal, round, and reactive to light.  Cardiovascular:     Rate and Rhythm: Normal rate and regular rhythm.  Pulmonary:     Effort: Pulmonary effort is normal.     Breath sounds: Normal breath sounds.  Abdominal:     General: Abdomen is flat.     Palpations: Abdomen is soft.  Musculoskeletal:        General: Normal range of motion.  Skin:    General: Skin is warm and dry.  Neurological:     General: No focal deficit present.     Mental Status: She is alert. Mental status is at baseline.  Psychiatric:        Attention and Perception: She is inattentive.        Mood and Affect: Mood normal. Affect is blunt.        Speech: She is noncommunicative.        Behavior: Behavior is slowed.        Thought Content: Thought content is paranoid.    Review of Systems  Constitutional:  Negative.   HENT: Negative.    Eyes: Negative.   Respiratory: Negative.    Cardiovascular: Negative.   Gastrointestinal: Negative.   Musculoskeletal: Negative.   Skin: Negative.   Neurological: Negative.   Psychiatric/Behavioral: Negative.     Blood pressure 110/66, pulse (!) 55, temperature 98.3 F (36.8 C), temperature source Oral, resp. rate 18, height 5\' 4"  (1.626 m), weight 40.4 kg, SpO2 100 %. Body mass index is 15.28 kg/m.   Treatment Plan Summary: Plan no change to medication.  Continue working on discharge planning  , MD 06/27/2022, 2:54 PM

## 2022-06-27 NOTE — Progress Notes (Signed)
Condition unchanged no distress noted interacting appropriately with peers and staff, she denies SI/HI/AVH will continue to monitor closely.

## 2022-06-28 DIAGNOSIS — F251 Schizoaffective disorder, depressive type: Secondary | ICD-10-CM | POA: Diagnosis not present

## 2022-06-28 NOTE — Progress Notes (Signed)
Patient alert and oriented x 4, affect is flat , thoughts are organized and coherent, there was no bizarre behavior noted she was complaint with medication and isolated to her room most of the shift. Patient was offered emotional support, 15 minutes safety checks maintained.  

## 2022-06-28 NOTE — Progress Notes (Signed)
[facility] MD Progress Note  06/28/2022 1:52 PM [name]  MRN:  [id number] Subjective: Patient seen for follow-up.  No new complaints no new problems but remains argumentative and paranoid Principal Problem: Schizoaffective disorder, depressive type (HCC) Diagnosis: Principal Problem:   Schizoaffective disorder, depressive type (HCC)  Total Time spent with patient: 20 minutes  Past Psychiatric History: History of schizoaffective disorder  Past Medical History:  Past Medical History:  Diagnosis Date   Anemia 10/02/2021   Non compliance w medication regimen    Schizophrenia [facility]     Past Surgical History:  Procedure Laterality Date   BIOPSY  09/25/2021   Procedure: BIOPSY;  Surgeon: Rachael Fee, MD;  Location: [facility];  Service: Gastroenterology;;   ESOPHAGOGASTRODUODENOSCOPY (EGD) WITH PROPOFOL [name] 09/25/2021   Procedure: ESOPHAGOGASTRODUODENOSCOPY (EGD) WITH PROPOFOL;  Surgeon: Rachael Fee, MD;  Location: [facility];  Service: Gastroenterology;  Laterality: [name];  With NGT placement   Family History: History reviewed. No pertinent family history. Family Psychiatric  History: See previous Social History:  Social History   Substance and Sexual Activity  Alcohol Use Not Currently   Comment: Refuses to disclose how much     Social History   Substance and Sexual Activity  Drug Use No   Comment: Refuses to answer    Social History   Socioeconomic History   Marital status: Single    Spouse name: Not on file   Number of children: Not on file   Years of education: Not on file   Highest education level: Not on file  Occupational History   Not on file  Tobacco Use   Smoking status: Every Day    Packs/day: 2.00    Types: Cigarettes   Smokeless tobacco: Never  Substance and Sexual Activity   Alcohol use: Not Currently    Comment: Refuses to disclose how much   Drug use: No    Comment: Refuses to answer   Sexual activity: Not Currently    Comment: refused to  answer  Other Topics Concern   Not on file  Social History Narrative   Not on file   Social Determinants of Health   Financial Resource Strain: Not on file  Food Insecurity: No Food Insecurity (04/08/2022)   Hunger Vital Sign    Worried About Running Out of Food in the Last Year: Never true    Ran Out of Food in the Last Year: Never true  Transportation Needs: No Transportation Needs (04/08/2022)   PRAPARE - Administrator, Civil Service (Medical): No    Lack of Transportation (Non-Medical): No  Physical Activity: Not on file  Stress: Not on file  Social Connections: Not on file   Additional Social History:                         Sleep: Fair  Appetite:  Fair  Current Medications: Current Facility-Administered Medications  Medication Dose Route Frequency Provider Last Rate Last Admin   acetaminophen (TYLENOL) tablet 650 mg  650 mg Oral Q6H PRN Shriley Joffe T, MD       alum & mag hydroxide-simeth (MAALOX/MYLANTA) 200-200-20 MG/5ML suspension 30 mL  30 mL Oral Q4H PRN Naziya Hegwood T, MD       haloperidol (HALDOL) tablet 5 mg  5 mg Oral BID Oluwadamilola Deliz T, MD   5 mg at 06/28/22 3151   Or   haloperidol lactate (HALDOL) injection 5 mg  5 mg Intramuscular BID Xan Sparkman,  [name], MD       lithium carbonate capsule 600 mg  600 mg Oral QHS Dao Mearns T, MD   600 mg at 06/27/22 2148   LORazepam (ATIVAN) tablet 1 mg  1 mg Oral BID Arien Morine T, MD   1 mg at 06/28/22 0818   Or   LORazepam (ATIVAN) injection 1 mg  1 mg Intramuscular BID Brandyn Thien T, MD       magnesium hydroxide (MILK OF MAGNESIA) suspension 30 mL  30 mL Oral Daily PRN Dejane Scheibe T, MD       mirtazapine (REMERON) tablet 30 mg  30 mg Oral QHS Odester Nilson T, MD   30 mg at 06/27/22 2148   multivitamin with minerals tablet 1 tablet  1 tablet Oral Daily Ireene Ballowe, [name], MD   1 tablet at 06/28/22 0817   QUEtiapine (SEROQUEL) tablet 400 mg  400 mg Oral QHS Cerina Leary T, MD   400 mg at  06/27/22 2147   thiamine (VITAMIN B1) tablet 100 mg  100 mg Oral Daily Briyah Wheelwright, [name], MD   100 mg at 06/28/22 0818    Lab Results: No results found for this or any previous visit (from the past 48 hour(s)).  Blood Alcohol level:  Lab Results  Component Value Date   ETH <10 03/29/2022   ETH <10 09/12/2021    Metabolic Disorder Labs: Lab Results  Component Value Date   HGBA1C 5.6 10/21/2021   MPG 114 10/21/2021   MPG 111 10/02/2021   Lab Results  Component Value Date   PROLACTIN 13.6 04/16/2015   Lab Results  Component Value Date   CHOL 189 10/21/2021   TRIG 52 10/21/2021   HDL 50 10/21/2021   CHOLHDL 3.8 10/21/2021   VLDL 10 10/21/2021   LDLCALC 129 (H) 10/21/2021   LDLCALC 93 10/01/2020    Physical Findings: AIMS: Facial and Oral Movements Muscles of Facial Expression: None, normal Lips and Perioral Area: None, normal Jaw: None, normal Tongue: None, normal,Extremity Movements Upper (arms, wrists, hands, fingers): None, normal Lower (legs, knees, ankles, toes): None, normal, Trunk Movements Neck, shoulders, hips: None, normal, Overall Severity Severity of abnormal movements (highest score from questions above): None, normal Incapacitation due to abnormal movements: None, normal Patient's awareness of abnormal movements (rate only patient's report): No Awareness, Dental Status Current problems with teeth and/or dentures?: No Does patient usually wear dentures?: No  CIWA:    COWS:     Musculoskeletal: Strength & Muscle Tone: within normal limits Gait & Station: normal Patient leans: [name]  Psychiatric Specialty Exam:  Presentation  General Appearance:  Disheveled  Eye Contact: Fair  Speech: Pressured  Speech Volume: Increased  Handedness: Right   Mood and Affect  Mood: Irritable; Labile; Angry  Affect: Blunt; Labile   Thought Process  Thought Processes: Disorganized; Irrevelant  Descriptions of  Associations:Loose  Orientation:Partial  Thought Content:Illogical; Rumination; Scattered; Tangential; Delusions  History of Schizophrenia/Schizoaffective disorder:Yes  Duration of Psychotic Symptoms:Greater than six months  Hallucinations:No data recorded Ideas of Reference:Percusatory; Paranoia  Suicidal Thoughts:No data recorded Homicidal Thoughts:No data recorded  Sensorium  Memory: Immediate Fair; Recent Fair; Remote Fair  Judgment: Impaired  Insight: Poor; Lacking   Executive Functions  Concentration: Poor  Attention Span: Poor  Recall: Poor  Fund of Knowledge: Poor  Language: Poor   Psychomotor Activity  Psychomotor Activity:No data recorded  Assets  Assets: Physical Health; Resilience; Housing; Social Support   Sleep  Sleep:No data recorded   Physical  Exam: Physical Exam Vitals reviewed.  Constitutional:      Appearance: Normal appearance.  HENT:     Head: Normocephalic and atraumatic.     Mouth/Throat:     Pharynx: Oropharynx is clear.  Eyes:     Pupils: Pupils are equal, round, and reactive to light.  Cardiovascular:     Rate and Rhythm: Normal rate and regular rhythm.  Pulmonary:     Effort: Pulmonary effort is normal.     Breath sounds: Normal breath sounds.  Abdominal:     General: Abdomen is flat.     Palpations: Abdomen is soft.  Musculoskeletal:        General: Normal range of motion.  Skin:    General: Skin is warm and dry.  Neurological:     General: No focal deficit present.     Mental Status: She is alert. Mental status is at baseline.  Psychiatric:        Attention and Perception: She is inattentive.        Mood and Affect: Mood normal. Affect is blunt.        Speech: Speech is delayed.        Behavior: Behavior is slowed.        Thought Content: Thought content is paranoid.    Review of Systems  Constitutional: Negative.   HENT: Negative.    Eyes: Negative.   Respiratory: Negative.    Cardiovascular:  Negative.   Gastrointestinal: Negative.   Musculoskeletal: Negative.   Skin: Negative.   Neurological: Negative.   Psychiatric/Behavioral: Negative.     Blood pressure 115/67, pulse (!) 58, temperature 97.9 F (36.6 C), temperature source Oral, resp. rate 18, height 5\' 4"  (1.626 m), weight 40.4 kg, SpO2 100 %. Body mass index is 15.28 kg/m.   Treatment Plan Summary: Plan no change in overall treatment plan.  Continue looking into placement options of possible  , MD 06/28/2022, 1:52 PM

## 2022-06-28 NOTE — Plan of Care (Signed)
Patient with flat affect but pleasant this morning. Patient ate breakfast and is med compliant. Denies SI. HI, and A/V/H with no plan/intent. Denies any pain. Remains at baseline with no new changes. Patient remains safe on unit.    Problem: Safety: Goal: Ability to remain free from injury will improve Outcome: Progressing   Problem: Education: Goal: Will be free of psychotic symptoms Outcome: Progressing   Problem: Safety: Goal: Ability to redirect hostility and anger into socially appropriate behaviors will improve Outcome: Progressing

## 2022-06-28 NOTE — [facility] Group Notes (Signed)
[facility] Group Notes:  (Nursing/MHT/Case Management/Adjunct)  Date:  06/28/2022  Time:  9:04 AM  Type of Therapy:   Community Meeting  Participation Level:  Active  Participation Quality:  Appropriate and Attentive  Affect:  Appropriate  Cognitive:  Alert and Appropriate  Insight:  Appropriate  Engagement in Group:  Engaged  Modes of Intervention:  Discussion, Education, and Support  Summary of Progress/Problems:  [name] [name] 06/28/2022, 9:04 AM

## 2022-06-29 DIAGNOSIS — F251 Schizoaffective disorder, depressive type: Secondary | ICD-10-CM | POA: Diagnosis not present

## 2022-06-29 NOTE — Plan of Care (Signed)
D- Patient alert and oriented. Patient alert and oriented. Patient presents with a flat mood and affect. Patient denies SI, HI, AVH, and pain. Patient denies anxiety and depression.   A- Scheduled medications administered to patient, per MD orders. Support and encouragement provided.  Routine safety checks conducted every 15 minutes.  Patient informed to notify staff with problems or concerns.  R- No adverse drug reactions noted. Patient contracts for safety at this time. Patient compliant with medications and treatment plan. Patient receptive, calm, and cooperative. Patient did not interact with others on the unit. Patient in her room and only out for meals. Patient remains safe at this time.  Problem: Clinical Measurements: Goal: Will remain free from infection Outcome: Progressing Goal: Respiratory complications will improve Outcome: Progressing Goal: Cardiovascular complication will be avoided Outcome: Progressing   Problem: Activity: Goal: Risk for activity intolerance will decrease Outcome: Progressing   Problem: Nutrition: Goal: Adequate nutrition will be maintained Outcome: Progressing   Problem: Coping: Goal: Level of anxiety will decrease Outcome: Progressing   Problem: Safety: Goal: Ability to remain free from injury will improve Outcome: Progressing   Problem: Skin Integrity: Goal: Risk for impaired skin integrity will decrease Outcome: Progressing   Problem: Education: Goal: Will be free of psychotic symptoms Outcome: Progressing   Problem: Health Behavior/Discharge Planning: Goal: Compliance with prescribed medication regimen will improve Outcome: Progressing

## 2022-06-29 NOTE — [facility] Group Notes (Signed)
[facility] Group Notes:  (Nursing/MHT/Case Management/Adjunct)  Date:  06/29/2022  Time:  8:39 PM  Type of Therapy:   Wrap up  Participation Level:  Did Not Attend  Summary of Progress/Problems:  CHELE CORNELL 06/29/2022, 8:39 PM

## 2022-06-29 NOTE — Progress Notes (Signed)
Patient is calm and cooperative this afternoon. She is compliant with scheduled medications. Patient is seen in the dayroom watching TV and came out of her room to eat dinner.

## 2022-06-29 NOTE — BH IP Treatment Plan (Signed)
Interdisciplinary Treatment and Diagnostic Plan Update  06/29/2022 Time of Session: 8:30AM [name] MRN: [id number]  Principal Diagnosis: Schizoaffective disorder, depressive type (HCC)  Secondary Diagnoses: Principal Problem:   Schizoaffective disorder, depressive type (HCC)   Current Medications:  Current Facility-Administered Medications  Medication Dose Route Frequency Provider Last Rate Last Admin   acetaminophen (TYLENOL) tablet 650 mg  650 mg Oral Q6H PRN [name], [name], MD       alum & mag hydroxide-simeth (MAALOX/MYLANTA) 200-200-20 MG/5ML suspension 30 mL  30 mL Oral Q4H PRN [name], [name], MD       haloperidol (HALDOL) tablet 5 mg  5 mg Oral BID [name], [name], MD   5 mg at 06/29/22 7829   Or   haloperidol lactate (HALDOL) injection 5 mg  5 mg Intramuscular BID [name], [name], MD       lithium carbonate capsule 600 mg  600 mg Oral QHS [name], [name], MD   600 mg at 06/28/22 2108   LORazepam (ATIVAN) tablet 1 mg  1 mg Oral BID [name], [name], MD   1 mg at 06/29/22 5621   Or   LORazepam (ATIVAN) injection 1 mg  1 mg Intramuscular BID [name], [name], MD       magnesium hydroxide (MILK OF MAGNESIA) suspension 30 mL  30 mL Oral Daily PRN [name], [name], MD       mirtazapine (REMERON) tablet 30 mg  30 mg Oral QHS [name], [name], MD   30 mg at 06/28/22 2109   multivitamin with minerals tablet 1 tablet  1 tablet Oral Daily [name], [name], MD   1 tablet at 06/29/22 0816   QUEtiapine (SEROQUEL) tablet 400 mg  400 mg Oral QHS [name], [name], MD   400 mg at 06/28/22 2108   thiamine (VITAMIN B1) tablet 100 mg  100 mg Oral Daily [name], [name], MD   100 mg at 06/29/22 0816   PTA Medications: Medications Prior to Admission  Medication Sig Dispense Refill Last Dose   clonazePAM (KLONOPIN) 0.5 MG tablet Take 0.5 tablets (0.25 mg total) by mouth 2 (two) times daily. (Patient not taking: Reported on 03/30/2022) 60 tablet 0    haloperidol (HALDOL) 5 MG tablet Take 1 tablet (5  mg total) by mouth 2 (two) times daily. (Patient not taking: Reported on 03/30/2022) 60 tablet 3    midodrine (PROAMATINE) 2.5 MG tablet Take 1 tablet (2.5 mg total) by mouth 3 (three) times daily with meals. (Patient not taking: Reported on 03/30/2022)  0    mirtazapine (REMERON) 15 MG tablet Take 0.5 tablets (7.5 mg total) by mouth at bedtime. (Patient not taking: Reported on 03/30/2022) 30 tablet 2    Multiple Vitamin (MULTIVITAMIN WITH MINERALS) TABS tablet Take 1 tablet by mouth daily. (Patient not taking: Reported on 03/30/2022)      OLANZapine (ZYPREXA) 5 MG tablet Take 1 tablet (5 mg total) by mouth at bedtime. (Patient not taking: Reported on 03/30/2022) 30 tablet 2    thiamine 100 MG tablet Take 1 tablet (100 mg total) by mouth daily. (Patient not taking: Reported on 03/30/2022)       Patient Stressors: Marital or family conflict    Patient Strengths: Supportive family/friends   Treatment Modalities: Medication Management, Group therapy, Case management,  1 to 1 session with clinician, Psychoeducation, Recreational therapy.   Physician Treatment Plan for Primary Diagnosis: Schizoaffective disorder, depressive type (HCC) Long Term Goal(s): Improvement in symptoms so as ready for  discharge   Short Term Goals: Compliance with prescribed medications will improve Ability to verbalize feelings will improve Ability to demonstrate self-control will improve Ability to identify and develop effective coping behaviors will improve  Medication Management: Evaluate patient's response, side effects, and tolerance of medication regimen.  Therapeutic Interventions: 1 to 1 sessions, Unit Group sessions and Medication administration.  Evaluation of Outcomes: Progressing  Physician Treatment Plan for Secondary Diagnosis: Principal Problem:   Schizoaffective disorder, depressive type (HCC)  Long Term Goal(s): Improvement in symptoms so as ready for discharge   Short Term Goals: Compliance with  prescribed medications will improve Ability to verbalize feelings will improve Ability to demonstrate self-control will improve Ability to identify and develop effective coping behaviors will improve     Medication Management: Evaluate patient's response, side effects, and tolerance of medication regimen.  Therapeutic Interventions: 1 to 1 sessions, Unit Group sessions and Medication administration.  Evaluation of Outcomes: Progressing   RN Treatment Plan for Primary Diagnosis: Schizoaffective disorder, depressive type (HCC) Long Term Goal(s): Knowledge of disease and therapeutic regimen to maintain health will improve  Short Term Goals: Ability to demonstrate self-control, Ability to participate in decision making will improve, Ability to verbalize feelings will improve, Ability to disclose and discuss suicidal ideas, Ability to identify and develop effective coping behaviors will improve, and Compliance with prescribed medications will improve  Medication Management: RN will administer medications as ordered by provider, will assess and evaluate patient's response and provide education to patient for prescribed medication. RN will report any adverse and/or side effects to prescribing provider.  Therapeutic Interventions: 1 on 1 counseling sessions, Psychoeducation, Medication administration, Evaluate responses to treatment, Monitor vital signs and CBGs as ordered, Perform/monitor CIWA, COWS, AIMS and Fall Risk screenings as ordered, Perform wound care treatments as ordered.  Evaluation of Outcomes: Progressing   LCSW Treatment Plan for Primary Diagnosis: Schizoaffective disorder, depressive type (HCC) Long Term Goal(s): Safe transition to appropriate next level of care at discharge, Engage patient in therapeutic group addressing interpersonal concerns.  Short Term Goals: Engage patient in aftercare planning with referrals and resources, Increase social support, Increase ability to  appropriately verbalize feelings, Increase emotional regulation, Facilitate acceptance of mental health diagnosis and concerns, and Increase skills for wellness and recovery  Therapeutic Interventions: Assess for all discharge needs, 1 to 1 time with Social worker, Explore available resources and support systems, Assess for adequacy in community support network, Educate family and significant other(s) on suicide prevention, Complete Psychosocial Assessment, Interpersonal group therapy.  Evaluation of Outcomes: Progressing   Progress in Treatment: Attending groups: No. Participating in groups: No. Taking medication as prescribed: Yes. Toleration medication: Yes. Family/Significant other contact made: Yes, individual(s) contacted:  SPE completed with the patient and with guardian.  Patient understands diagnosis: Yes. Discussing patient identified problems/goals with staff: Yes. Medical problems stabilized or resolved: Yes. Denies suicidal/homicidal ideation: Yes. Issues/concerns per patient self-inventory: No. Other: none  New problem(s) identified: No, Describe:  none  Update 06/29/2022: No changes at this time.    New Short Term/Long Term Goal(s): Patient to work towards elimination of symptoms of psychosis, medication management for mood stabilization; elimination of SI thoughts; development of comprehensive mental wellness plan.  Update 06/29/2022: No changes at this time.   Patient Goals:  No additional goals identified at this time. Patient to continue to work towards original goals identified in initial treatment team meeting. CSW will remain available to patient should they voice additional treatment goals. Update 06/29/2022: No changes at this  time.   Discharge Plan or Barriers: Patient lacks adequate housing/supervision at discharge. Patient's guardian refuses to pick patient up from hospital out of safety concerns. CSW team continuing to work with legal guardian to attempt to  resolve concerns and identify adequate discharge plan. Update 06/29/2022: Guardian is seeking special assistance medicaid.  She has not completed the paperwork at last update.  Patient remains on the unit and safe, however, no plans have been developed for placement at discharge.  Patient lacks funding for group home, and can not afford out of pocket costs associated with Assisted Living.   Reason for Continuation of Hospitalization: Other; describe Patient is psychiatrically clear. Patient is currently boarding.    Estimated Length of Stay: TBD Update 06/29/2022: TBD  Last 3 [place] Suicide Severity Risk Score: Flowsheet Row Admission (Current) from 04/08/2022 in [facility] INPATIENT BEHAVIORAL MEDICINE Admission (Discharged) from 10/17/2021 in [facility] BEHAVIORAL MEDICINE Admission (Discharged) from 10/02/2021 in [facility] REGIONAL CARDIAC MED PCU  C-SSRS RISK CATEGORY No Risk No Risk No Risk       Last PHQ 2/9 Scores:     No data to display          Scribe for Treatment Team: Harden Mo, LCSW 06/29/2022 9:05 AM

## 2022-06-29 NOTE — Group Note (Signed)
[facility] LCSW Group Therapy Note    Group Date: 06/29/2022 Start Time: 1300 End Time: 1400  Type of Therapy and Topic:  Group Therapy:  Overcoming Obstacles  Participation Level:  [facility] PARTICIPATION LEVEL: Did Not Attend  Description of Group:   In this group patients will be encouraged to explore what they see as obstacles to their own wellness and recovery. They will be guided to discuss their thoughts, feelings, and behaviors related to these obstacles. The group will process together ways to cope with barriers, with attention given to specific choices patients can make. Each patient will be challenged to identify changes they are motivated to make in order to overcome their obstacles. This group will be process-oriented, with patients participating in exploration of their own experiences as well as giving and receiving support and challenge from other group members.  Therapeutic Goals: 1. Patient will identify personal and current obstacles as they relate to admission. 2. Patient will identify barriers that currently interfere with their wellness or overcoming obstacles.  3. Patient will identify feelings, thought process and behaviors related to these barriers. 4. Patient will identify two changes they are willing to make to overcome these obstacles:    Summary of Patient Progress Pt declined to attend group despite invitation.    Therapeutic Modalities:   Cognitive Behavioral Therapy Solution Focused Therapy Motivational Interviewing Relapse Prevention Therapy   Trinton Prewitt R Rosie Torrez, LCSW 

## 2022-06-29 NOTE — Progress Notes (Signed)
Patient calm and pleasant during assessment denying SI/HI/AVH. Pt observed interacting appropriately with staff and peers on the unit. Pt compliant with medication administration per MD orders. Pt given education, support, and encouragement to be active in her treatment plan. Pt being monitored Q 15 minutes for safety per unit protocol, remains safe on the unit   

## 2022-06-29 NOTE — Progress Notes (Signed)
Patient alert and oriented x 4, affect is flat , thoughts are organized and coherent, there was no bizarre behavior noted she was complaint with medication and isolated to her room most of the shift. Patient was offered emotional support, 15 minutes safety checks maintained.

## 2022-06-29 NOTE — Progress Notes (Signed)
Recreation Therapy Notes    Date: 06/29/2022   Time: 10:15 am      Location: Craft room    Behavioral response: [name]   Intervention Topic: Strengths    Discussion/Intervention: Patient refused to attend group.    Clinical Observations/Feedback:  Patient refused to attend group.    Luman Holway LRT/CTRS          Jeanae Whitmill 06/29/2022 11:38 AM

## 2022-06-29 NOTE — Progress Notes (Signed)
[facility] MD Progress Note  06/29/2022 2:09 PM [name]  MRN:  [id number] Subjective: Follow-up for this 61 year old woman with schizoaffective disorder.  No new complaints.  No change in behavior.  Continues to be paranoid and disorganized. Principal Problem: Schizoaffective disorder, depressive type (HCC) Diagnosis: Principal Problem:   Schizoaffective disorder, depressive type (HCC)  Total Time spent with patient: 30 minutes  Past Psychiatric History: Past history of schizophrenia  Past Medical History:  Past Medical History:  Diagnosis Date   Anemia 10/02/2021   Non compliance w medication regimen    Schizophrenia [facility])     Past Surgical History:  Procedure Laterality Date   BIOPSY  09/25/2021   Procedure: BIOPSY;  Surgeon: Rachael Fee, MD;  Location: [facility];  Service: Gastroenterology;;   ESOPHAGOGASTRODUODENOSCOPY (EGD) WITH PROPOFOL [name] 09/25/2021   Procedure: ESOPHAGOGASTRODUODENOSCOPY (EGD) WITH PROPOFOL;  Surgeon: Rachael Fee, MD;  Location: [facility];  Service: Gastroenterology;  Laterality: [name];  With NGT placement   Family History: History reviewed. No pertinent family history. Family Psychiatric  History: See previous Social History:  Social History   Substance and Sexual Activity  Alcohol Use Not Currently   Comment: Refuses to disclose how much     Social History   Substance and Sexual Activity  Drug Use No   Comment: Refuses to answer    Social History   Socioeconomic History   Marital status: Single    Spouse name: Not on file   Number of children: Not on file   Years of education: Not on file   Highest education level: Not on file  Occupational History   Not on file  Tobacco Use   Smoking status: Every Day    Packs/day: 2.00    Types: Cigarettes   Smokeless tobacco: Never  Substance and Sexual Activity   Alcohol use: Not Currently    Comment: Refuses to disclose how much   Drug use: No    Comment: Refuses to answer   Sexual  activity: Not Currently    Comment: refused to answer  Other Topics Concern   Not on file  Social History Narrative   Not on file   Social Determinants of Health   Financial Resource Strain: Not on file  Food Insecurity: No Food Insecurity (04/08/2022)   Hunger Vital Sign    Worried About Running Out of Food in the Last Year: Never true    Ran Out of Food in the Last Year: Never true  Transportation Needs: No Transportation Needs (04/08/2022)   PRAPARE - Administrator, Civil Service (Medical): No    Lack of Transportation (Non-Medical): No  Physical Activity: Not on file  Stress: Not on file  Social Connections: Not on file   Additional Social History:                         Sleep: Fair  Appetite:  Fair  Current Medications: Current Facility-Administered Medications  Medication Dose Route Frequency Provider Last Rate Last Admin   acetaminophen (TYLENOL) tablet 650 mg  650 mg Oral Q6H PRN Kanai Berrios T, MD       alum & mag hydroxide-simeth (MAALOX/MYLANTA) 200-200-20 MG/5ML suspension 30 mL  30 mL Oral Q4H PRN Ovadia Lopp T, MD       haloperidol (HALDOL) tablet 5 mg  5 mg Oral BID Romey Cohea, [name], MD   5 mg at 06/29/22 0816   Or   haloperidol lactate (HALDOL) injection  5 mg  5 mg Intramuscular BID Helmuth Recupero T, MD       lithium carbonate capsule 600 mg  600 mg Oral QHS Piper Hassebrock T, MD   600 mg at 06/28/22 2108   LORazepam (ATIVAN) tablet 1 mg  1 mg Oral BID Allizon Woznick T, MD   1 mg at 06/29/22 [id number]   Or   LORazepam (ATIVAN) injection 1 mg  1 mg Intramuscular BID Roshad Hack T, MD       magnesium hydroxide (MILK OF MAGNESIA) suspension 30 mL  30 mL Oral Daily PRN Lennart Gladish T, MD       mirtazapine (REMERON) tablet 30 mg  30 mg Oral QHS Dainel Arcidiacono T, MD   30 mg at 06/28/22 2109   multivitamin with minerals tablet 1 tablet  1 tablet Oral Daily Kady Toothaker, [name], MD   1 tablet at 06/29/22 0816   QUEtiapine (SEROQUEL) tablet 400 mg  400 mg  Oral QHS Nakyia Dau T, MD   400 mg at 06/28/22 2108   thiamine (VITAMIN B1) tablet 100 mg  100 mg Oral Daily Marquavius Scaife, [name], MD   100 mg at 06/29/22 [id number]    Lab Results: No results found for this or any previous visit (from the past 48 hour(s)).  Blood Alcohol level:  Lab Results  Component Value Date   ETH <10 03/29/2022   ETH <10 123XX123    Metabolic Disorder Labs: Lab Results  Component Value Date   HGBA1C 5.6 10/21/2021   MPG 114 10/21/2021   MPG 111 10/02/2021   Lab Results  Component Value Date   PROLACTIN 13.6 04/16/2015   Lab Results  Component Value Date   CHOL 189 10/21/2021   TRIG 52 10/21/2021   HDL 50 10/21/2021   CHOLHDL 3.8 10/21/2021   VLDL 10 10/21/2021   LDLCALC 129 (H) 10/21/2021   LDLCALC 93 10/01/2020    Physical Findings: AIMS: Facial and Oral Movements Muscles of Facial Expression: None, normal Lips and Perioral Area: None, normal Jaw: None, normal Tongue: None, normal,Extremity Movements Upper (arms, wrists, hands, fingers): None, normal Lower (legs, knees, ankles, toes): None, normal, Trunk Movements Neck, shoulders, hips: None, normal, Overall Severity Severity of abnormal movements (highest score from questions above): None, normal Incapacitation due to abnormal movements: None, normal Patient's awareness of abnormal movements (rate only patient's report): No Awareness, Dental Status Current problems with teeth and/or dentures?: No Does patient usually wear dentures?: No  CIWA:    COWS:     Musculoskeletal: Strength & Muscle Tone: within normal limits Gait & Station: normal Patient leans: [name]  Psychiatric Specialty Exam:  Presentation  General Appearance:  Disheveled  Eye Contact: Fair  Speech: Pressured  Speech Volume: Increased  Handedness: Right   Mood and Affect  Mood: Irritable; Labile; Angry  Affect: Blunt; Labile   Thought Process  Thought Processes: Disorganized; Irrevelant  Descriptions  of Associations:Loose  Orientation:Partial  Thought Content:Illogical; Rumination; Scattered; Tangential; Delusions  History of Schizophrenia/Schizoaffective disorder:Yes  Duration of Psychotic Symptoms:Greater than six months  Hallucinations:No data recorded Ideas of Reference:Percusatory; Paranoia  Suicidal Thoughts:No data recorded Homicidal Thoughts:No data recorded  Sensorium  Memory: Immediate Fair; Recent Fair; Remote Fair  Judgment: Impaired  Insight: Poor; Lacking   Executive Functions  Concentration: Poor  Attention Span: Poor  Recall: Poor  Fund of Knowledge: Poor  Language: Poor   Psychomotor Activity  Psychomotor Activity:No data recorded  Assets  Assets: Physical Health; Resilience; Housing; Social Support  Sleep  Sleep:No data recorded   Physical Exam: Physical Exam Vitals and nursing note reviewed.  Constitutional:      Appearance: Normal appearance.  HENT:     Head: Normocephalic and atraumatic.     Mouth/Throat:     Pharynx: Oropharynx is clear.  Eyes:     Pupils: Pupils are equal, round, and reactive to light.  Cardiovascular:     Rate and Rhythm: Normal rate and regular rhythm.  Pulmonary:     Effort: Pulmonary effort is normal.     Breath sounds: Normal breath sounds.  Abdominal:     General: Abdomen is flat.     Palpations: Abdomen is soft.  Musculoskeletal:        General: Normal range of motion.  Skin:    General: Skin is warm and dry.  Neurological:     General: No focal deficit present.     Mental Status: She is alert. Mental status is at baseline.  Psychiatric:        Attention and Perception: She is inattentive.        Mood and Affect: Affect is blunt.        Speech: Speech is delayed.        Behavior: Behavior is slowed.        Thought Content: Thought content is paranoid.    Review of Systems  Constitutional: Negative.   HENT: Negative.    Eyes: Negative.   Respiratory: Negative.     Cardiovascular: Negative.   Gastrointestinal: Negative.   Musculoskeletal: Negative.   Skin: Negative.   Neurological: Negative.   Psychiatric/Behavioral: Negative.     Blood pressure 114/76, pulse 61, temperature 98.4 F (36.9 C), temperature source Oral, resp. rate 18, height 5\' 4"  (1.626 m), weight 40.4 kg, SpO2 100 %. Body mass index is 15.28 kg/m.   Treatment Plan Summary: Plan no change to medication management.  Tolerating current medicine well.  Had not responded well or not tolerated other trials of medicine.  Continue to try to review the discharge plan with her but she becomes paranoid which is the reason we are not able to discharge her so far.  Alethia Berthold, MD 06/29/2022, 2:09 PM

## 2022-06-30 DIAGNOSIS — F251 Schizoaffective disorder, depressive type: Secondary | ICD-10-CM | POA: Diagnosis not present

## 2022-06-30 NOTE — Progress Notes (Signed)
Pt denies SI/HI/AVH and verbally agrees to approach staff if these become apparent or before harming themselves/others. Rates depression 0/10. Rates anxiety 0/10. Rates pain 0/10.   Scheduled medications administered to pt, per MD orders. RN provided support and encouragement to pt. Q15 min safety checks implemented and continued. Pt safe on the unit. RN will continue to monitor and intervene as needed.  06/30/22 0829  Psych Admission Type (Psych Patients Only)  Admission Status Involuntary  Psychosocial Assessment  Patient Complaints None  Eye Contact Fair  Facial Expression Flat  Affect Flat  Speech Logical/coherent  Interaction Guarded;Isolative;Minimal  Motor Activity Slow  Appearance/Hygiene Unremarkable;In scrubs  Behavior Characteristics Cooperative;Appropriate to situation;Calm  Mood Pleasant  Thought Process  Coherency WDL  Content WDL  Delusions None reported or observed  Perception WDL  Hallucination None reported or observed  Judgment WDL  Confusion None  Danger to Self  Current suicidal ideation? Denies  Danger to Others  Danger to Others None reported or observed

## 2022-06-30 NOTE — Progress Notes (Signed)
Recreation Therapy Notes  Date: 06/30/2022  Time: 10:00 am    Location: Craft room     Behavioral response: [name]   Intervention Topic: Self-esteem   Discussion/Intervention: Patient refused to attend group.   Clinical Observations/Feedback:  Patient refused to attend group.    Davine Coba LRT/CTRS        Karmina Zufall 06/30/2022 11:53 AM

## 2022-06-30 NOTE — Group Note (Signed)
LCSW Group Therapy Note   Group Date: 06/30/2022 Start Time: 1300 End Time: 1400   Type of Therapy and Topic:  Group Therapy: Challenging Core Beliefs  Participation Level:  Did Not Attend  Description of Group:  Patients were educated about core beliefs and asked to identify one harmful core belief that they have. Patients were asked to explore from where those beliefs originate. Patients were asked to discuss how those beliefs make them feel and the resulting behaviors of those beliefs. They were then be asked if those beliefs are true and, if so, what evidence they have to support them. Lastly, group members were challenged to replace those negative core beliefs with helpful beliefs.   Therapeutic Goals:   1. Patient will identify harmful core beliefs and explore the origins of such beliefs. 2. Patient will identify feelings and behaviors that result from those core beliefs. 3. Patient will discuss whether such beliefs are true. 4.  Patient will replace harmful core beliefs with helpful ones.  Summary of Patient Progress:   Patient did not attend group despite encouraged participation.   Therapeutic Modalities: Cognitive Behavioral Therapy; Solution-Focused Therapy   Earlie Schank W Hamsini Verrilli, LCSWA 06/30/2022  2:31 PM   

## 2022-06-30 NOTE — Progress Notes (Signed)
[facility] MD Progress Note  06/30/2022 5:08 PM [name]  MRN:  [id number] Subjective: Follow-up 61 year old woman with schizoaffective disorder.  No new complaints no change to current status or situation. Principal Problem: Schizoaffective disorder, depressive type (HCC) Diagnosis: Principal Problem:   Schizoaffective disorder, depressive type (HCC)  Total Time spent with patient: 30 minutes  Past Psychiatric History: Schizophrenia  Past Medical History:  Past Medical History:  Diagnosis Date   Anemia 10/02/2021   Non compliance w medication regimen    Schizophrenia [facility])     Past Surgical History:  Procedure Laterality Date   BIOPSY  09/25/2021   Procedure: BIOPSY;  Surgeon: Rachael Fee, MD;  Location: [facility];  Service: Gastroenterology;;   ESOPHAGOGASTRODUODENOSCOPY (EGD) WITH PROPOFOL [name] 09/25/2021   Procedure: ESOPHAGOGASTRODUODENOSCOPY (EGD) WITH PROPOFOL;  Surgeon: Rachael Fee, MD;  Location: [facility];  Service: Gastroenterology;  Laterality: [name];  With NGT placement   Family History: History reviewed. No pertinent family history. Family Psychiatric  History: See previous Social History:  Social History   Substance and Sexual Activity  Alcohol Use Not Currently   Comment: Refuses to disclose how much     Social History   Substance and Sexual Activity  Drug Use No   Comment: Refuses to answer    Social History   Socioeconomic History   Marital status: Single    Spouse name: Not on file   Number of children: Not on file   Years of education: Not on file   Highest education level: Not on file  Occupational History   Not on file  Tobacco Use   Smoking status: Every Day    Packs/day: 2.00    Types: Cigarettes   Smokeless tobacco: Never  Substance and Sexual Activity   Alcohol use: Not Currently    Comment: Refuses to disclose how much   Drug use: No    Comment: Refuses to answer   Sexual activity: Not Currently    Comment: refused to  answer  Other Topics Concern   Not on file  Social History Narrative   Not on file   Social Determinants of Health   Financial Resource Strain: Not on file  Food Insecurity: No Food Insecurity (04/08/2022)   Hunger Vital Sign    Worried About Running Out of Food in the Last Year: Never true    Ran Out of Food in the Last Year: Never true  Transportation Needs: No Transportation Needs (04/08/2022)   PRAPARE - Administrator, Civil Service (Medical): No    Lack of Transportation (Non-Medical): No  Physical Activity: Not on file  Stress: Not on file  Social Connections: Not on file   Additional Social History:                         Sleep: Fair  Appetite:  Fair  Current Medications: Current Facility-Administered Medications  Medication Dose Route Frequency Provider Last Rate Last Admin   acetaminophen (TYLENOL) tablet 650 mg  650 mg Oral Q6H PRN Seydina Holliman T, MD       alum & mag hydroxide-simeth (MAALOX/MYLANTA) 200-200-20 MG/5ML suspension 30 mL  30 mL Oral Q4H PRN Demisha Nokes T, MD       haloperidol (HALDOL) tablet 5 mg  5 mg Oral BID Latreece Mochizuki, [name], MD   5 mg at 06/30/22 7829   Or   haloperidol lactate (HALDOL) injection 5 mg  5 mg Intramuscular BID Dorethia Jeanmarie T,  MD       lithium carbonate capsule 600 mg  600 mg Oral QHS Clairessa Boulet T, MD   600 mg at 06/29/22 2058   LORazepam (ATIVAN) tablet 1 mg  1 mg Oral BID Milicent Acheampong T, MD   1 mg at 06/30/22 0938   Or   LORazepam (ATIVAN) injection 1 mg  1 mg Intramuscular BID Chenell Lozon T, MD       magnesium hydroxide (MILK OF MAGNESIA) suspension 30 mL  30 mL Oral Daily PRN Monick Rena T, MD       mirtazapine (REMERON) tablet 30 mg  30 mg Oral QHS Kajol Crispen, [name], MD   30 mg at 06/29/22 2058   multivitamin with minerals tablet 1 tablet  1 tablet Oral Daily Toniesha Zellner, [name], MD   1 tablet at 06/30/22 1829   QUEtiapine (SEROQUEL) tablet 400 mg  400 mg Oral QHS Ilyse Tremain T, MD   400 mg at  06/29/22 2058   thiamine (VITAMIN B1) tablet 100 mg  100 mg Oral Daily Hindy Perrault, [name], MD   100 mg at 06/30/22 9371    Lab Results: No results found for this or any previous visit (from the past 48 hour(s)).  Blood Alcohol level:  Lab Results  Component Value Date   ETH <10 03/29/2022   ETH <10 09/12/2021    Metabolic Disorder Labs: Lab Results  Component Value Date   HGBA1C 5.6 10/21/2021   MPG 114 10/21/2021   MPG 111 10/02/2021   Lab Results  Component Value Date   PROLACTIN 13.6 04/16/2015   Lab Results  Component Value Date   CHOL 189 10/21/2021   TRIG 52 10/21/2021   HDL 50 10/21/2021   CHOLHDL 3.8 10/21/2021   VLDL 10 10/21/2021   LDLCALC 129 (H) 10/21/2021   LDLCALC 93 10/01/2020    Physical Findings: AIMS: Facial and Oral Movements Muscles of Facial Expression: None, normal Lips and Perioral Area: None, normal Jaw: None, normal Tongue: None, normal,Extremity Movements Upper (arms, wrists, hands, fingers): None, normal Lower (legs, knees, ankles, toes): None, normal, Trunk Movements Neck, shoulders, hips: None, normal, Overall Severity Severity of abnormal movements (highest score from questions above): None, normal Incapacitation due to abnormal movements: None, normal Patient's awareness of abnormal movements (rate only patient's report): No Awareness, Dental Status Current problems with teeth and/or dentures?: No Does patient usually wear dentures?: No  CIWA:    COWS:     Musculoskeletal: Strength & Muscle Tone: within normal limits Gait & Station: normal Patient leans: [name]  Psychiatric Specialty Exam:  Presentation  General Appearance:  Disheveled  Eye Contact: Fair  Speech: Pressured  Speech Volume: Increased  Handedness: Right   Mood and Affect  Mood: Irritable; Labile; Angry  Affect: Blunt; Labile   Thought Process  Thought Processes: Disorganized; Irrevelant  Descriptions of  Associations:Loose  Orientation:Partial  Thought Content:Illogical; Rumination; Scattered; Tangential; Delusions  History of Schizophrenia/Schizoaffective disorder:Yes  Duration of Psychotic Symptoms:Greater than six months  Hallucinations:No data recorded Ideas of Reference:Percusatory; Paranoia  Suicidal Thoughts:No data recorded Homicidal Thoughts:No data recorded  Sensorium  Memory: Immediate Fair; Recent Fair; Remote Fair  Judgment: Impaired  Insight: Poor; Lacking   Executive Functions  Concentration: Poor  Attention Span: Poor  Recall: Poor  Fund of Knowledge: Poor  Language: Poor   Psychomotor Activity  Psychomotor Activity:No data recorded  Assets  Assets: Physical Health; Resilience; Housing; Social Support   Sleep  Sleep:No data recorded   Physical Exam: Physical  Exam Vitals reviewed.  Constitutional:      Appearance: Normal appearance.  HENT:     Head: Normocephalic and atraumatic.     Mouth/Throat:     Pharynx: Oropharynx is clear.  Eyes:     Pupils: Pupils are equal, round, and reactive to light.  Cardiovascular:     Rate and Rhythm: Normal rate and regular rhythm.  Pulmonary:     Effort: Pulmonary effort is normal.     Breath sounds: Normal breath sounds.  Abdominal:     General: Abdomen is flat.     Palpations: Abdomen is soft.  Musculoskeletal:        General: Normal range of motion.  Skin:    General: Skin is warm and dry.  Neurological:     General: No focal deficit present.     Mental Status: She is alert. Mental status is at baseline.  Psychiatric:        Mood and Affect: Mood normal.        Speech: Speech is delayed.        Thought Content: Thought content is paranoid.    Review of Systems  Constitutional: Negative.   HENT: Negative.    Eyes: Negative.   Respiratory: Negative.    Cardiovascular: Negative.   Gastrointestinal: Negative.   Musculoskeletal: Negative.   Skin: Negative.   Neurological:  Negative.   Psychiatric/Behavioral: Negative.     Blood pressure 116/70, pulse (!) 58, temperature 97.7 F (36.5 C), temperature source Oral, resp. rate 18, height 5\' 4"  (1.626 m), weight 40.4 kg, SpO2 100 %. Body mass index is 15.28 kg/m.   Treatment Plan Summary: Plan no change to medication.  Supportive therapy.  Treatment team continues to struggle with discharge planning  , MD 06/30/2022, 5:08 PM

## 2022-06-30 NOTE — Plan of Care (Signed)
  Problem: Nutrition: Goal: Adequate nutrition will be maintained Outcome: Progressing   Problem: Coping: Goal: Level of anxiety will decrease Outcome: Progressing   

## 2022-07-01 DIAGNOSIS — F251 Schizoaffective disorder, depressive type: Secondary | ICD-10-CM | POA: Diagnosis not present

## 2022-07-01 NOTE — Group Note (Signed)
[facility] LCSW Group Therapy Note   Group Date: 07/01/2022 Start Time: 1300 End Time: 1400   Type of Therapy/Topic:  Group Therapy:  Emotion Regulation  Participation Level:  Did Not Attend   Mood:  Description of Group:    The purpose of this group is to assist patients in learning to regulate negative emotions and experience positive emotions. Patients will be guided to discuss ways in which they have been vulnerable to their negative emotions. These vulnerabilities will be juxtaposed with experiences of positive emotions or situations, and patients challenged to use positive emotions to combat negative ones. Special emphasis will be placed on coping with negative emotions in conflict situations, and patients will process healthy conflict resolution skills.  Therapeutic Goals: Patient will identify two positive emotions or experiences to reflect on in order to balance out negative emotions:  Patient will label two or more emotions that they find the most difficult to experience:  Patient will be able to demonstrate positive conflict resolution skills through discussion or role plays:   Summary of Patient Progress:   X    Therapeutic Modalities:   Cognitive Behavioral Therapy Feelings Identification Dialectical Behavioral Therapy   Harden Mo, LCSW

## 2022-07-01 NOTE — [facility] Group Notes (Signed)
[facility] Group Notes:  (Nursing/MHT/Case Management/Adjunct)  Date:  07/01/2022  Time:  8:20 PM  Type of Therapy:   Wrap up  Participation Level:  Did Not Attend  Summary of Progress/Problems:  [name] 07/01/2022, 8:20 PM

## 2022-07-01 NOTE — [facility] Counselor (Signed)
CSW re-sent the FL2, H&P, demographics and 3 most recent notes to [facility].  Thayer Ohm reports that he will follow up with the CSW team.  CSW shared contact information of the team.  Penni Homans, MSW, LCSW 07/01/2022 4:28 PM

## 2022-07-01 NOTE — Plan of Care (Signed)
  Problem: Nutrition: Goal: Adequate nutrition will be maintained Outcome: Progressing   Problem: Coping: Goal: Level of anxiety will decrease Outcome: Progressing   

## 2022-07-01 NOTE — Progress Notes (Signed)
Patient calm and pleasant during assessment denying SI/HI/AVH. Pt observed interacting appropriately with staff and peers on the unit. Pt compliant with medication administration per MD orders. Pt given education, support, and encouragement to be active in her treatment plan. Pt being monitored Q 15 minutes for safety per unit protocol, remains safe on the unit   

## 2022-07-01 NOTE — Progress Notes (Signed)
Recreation Therapy Notes   Date: 07/01/2022  Time: 10:35 am    Location: Craft room     Behavioral response: [name]   Intervention Topic: Values   Discussion/Intervention: Patient refused to attend group.   Clinical Observations/Feedback:  Patient refused to attend group.    Toluwani Yadav LRT/CTRS        Jhoselyn Ruffini 07/01/2022 11:49 AM

## 2022-07-01 NOTE — [place] FL2 (Signed)
[place] MEDICAID FL2 LEVEL OF CARE FORM     IDENTIFICATION  Patient Name: [name] Birthdate: [birth year] Sex: female Admission Date (Current Location): 04/08/2022  [facility] and [place] Number:  Chiropodist and Address:  [facility], [address] [place], [place] [address]      Provider Number: [id number]  Attending Physician Name and Address:  [name], [name], MD  Relative Name and Phone Number:       Current Level of Care: Hospital Recommended Level of Care: Assisted Living Facility, Family Care Home, Other (Comment) (Group Home, Memory Care) Prior Approval Number:    Date Approved/Denied:   PASRR Number:    Discharge Plan: Other (Comment) (Group Home, Assisted Living, Family Care Home)    Current Diagnoses: Patient Active Problem List   Diagnosis Date Noted   Schizoaffective disorder, depressive type (HCC) 04/08/2022   Ventricular tachycardia (HCC) 10/13/2021   Sinus bradycardia 10/03/2021   Anemia of chronic disease 10/02/2021   Hypomagnesemia 09/22/2021   Hypokalemia 09/22/2021   Hypophosphatemia 09/21/2021   Hyponatremia 09/20/2021   Metabolic acidosis 09/16/2021   Protein-calorie malnutrition, severe (HCC) 03/27/2020   Hypotension 03/26/2020   Anorexia 03/26/2020   Hypokalemia, hypomagnesemia and hypophosphatemia 03/26/2020   Hypocalcemia 03/26/2020   Fall at home, initial encounter 03/26/2020   Recurrent hypoglycemia and severe malnutrition 03/14/2020   Paranoid schizophrenia (HCC) 03/01/2020   Noncompliance with diet and medication regimen    Psychoses (HCC)    Tobacco abuse 03/12/2014   Hypokalemic alkalosis 11/11/2011   Medically noncompliant 11/11/2011    Orientation RESPIRATION BLADDER Height & Weight     Self, Situation, Place  Normal Continent Weight: 89 lb (40.4 kg) Height:  5\' 4"  (162.6 cm)  BEHAVIORAL SYMPTOMS/MOOD NEUROLOGICAL BOWEL NUTRITION STATUS   (NA)  (NA) Continent Diet (Regular)   AMBULATORY STATUS COMMUNICATION OF NEEDS Skin   Independent Verbally Normal                       Personal Care Assistance Level of Assistance   (NA)           Functional Limitations Info   (NA)          SPECIAL CARE FACTORS FREQUENCY   (NA)                    Contractures Contractures Info: Not present    Additional Factors Info  Code Status, Allergies, Psychotropic Code Status Info: Full Allergies Info: Aspirin, Ziprasidone Hc Psychotropic Info: Haldol, Remeron, Lithium Carbonate, Ativan, Seroquel         Current Medications (07/01/2022):  This is the current hospital active medication list Current Facility-Administered Medications  Medication Dose Route Frequency Provider Last Rate Last Admin   acetaminophen (TYLENOL) tablet 650 mg  650 mg Oral Q6H PRN [name], [name], MD       alum & mag hydroxide-simeth (MAALOX/MYLANTA) 200-200-20 MG/5ML suspension 30 mL  30 mL Oral Q4H PRN [name], [name], MD       haloperidol (HALDOL) tablet 5 mg  5 mg Oral BID [name], [name], MD   5 mg at 07/01/22 07/03/22   Or   haloperidol lactate (HALDOL) injection 5 mg  5 mg Intramuscular BID [name], [name], MD       lithium carbonate capsule 600 mg  600 mg Oral QHS [name], [name], MD   600 mg at 06/30/22 2102   LORazepam (ATIVAN) tablet 1 mg  1 mg  Oral BID [name], [name], MD   1 mg at 07/01/22 3646   Or   LORazepam (ATIVAN) injection 1 mg  1 mg Intramuscular BID [name], [name], MD       magnesium hydroxide (MILK OF MAGNESIA) suspension 30 mL  30 mL Oral Daily PRN [name], [name], MD       mirtazapine (REMERON) tablet 30 mg  30 mg Oral QHS [name], [name], MD   30 mg at 06/30/22 2102   multivitamin with minerals tablet 1 tablet  1 tablet Oral Daily [name], [name], MD   1 tablet at 07/01/22 8032   QUEtiapine (SEROQUEL) tablet 400 mg  400 mg Oral QHS [name], [name], MD   400 mg at 06/30/22 2102   thiamine (VITAMIN B1) tablet 100 mg  100 mg Oral Daily [name], [name], MD   100  mg at 07/01/22 1224     Discharge Medications: Please see discharge summary for a list of discharge medications.  Relevant Imaging Results:  Relevant Lab Results:   Additional Information    Harden Mo, LCSW

## 2022-07-01 NOTE — [facility] Counselor (Addendum)
CSW received call from the patient's sister/guardian.  Guardian reports that patient was DENIED for Special Assistance Medicaid.  She report that Medicaid informed her that patient is Medicaid PENDING.  She reports that she has "45 days to get the patient placed".  When CSW asked what was to occur if placement was not obtained in 45 days, guardian reported "I guess I start the process all over again".  CSW expressed concern that patient may not be appropriate for a memory care facility.  Guardian reports that she would ike foe the patient to go to a memory care facility "because she gets in her head that she has a home and she has to get back to it, she'll forget to eat in an effort to get home. I don't want her killing herself".  She reports that she is not aligned with patient going to a facility where the patient is not behind locked doors due to fear that patient will wander off in an effort to go home.  CSW followed up on guardian working towards placement.  Guardian reports that she is "getting call backs, all I know to do is to call and see if they have room and then go to see the place".    She reports that patient still can NOT return home at this time.  Penni Homans, MSW, LCSW 07/01/2022 10:52 AM   ADDENDUM  CSW received a return call from sister requesting CSW to send information to [facility].   Sister/guardian appeared surprised that recommendations previously have been for patient to return home as evidenced by tone, questions and statements from guardian.  CSW pointed out that placement has been the topic of conversation for several months.  CSW reached out to psychiatrist who supports referrals for Memory Care at this time.  Penni Homans, MSW, LCSW 07/01/2022 11:30 AM

## 2022-07-01 NOTE — [facility] Counselor (Signed)
CSW referred the patient to the following:  Destination  Service Provider Request Status  [facility] Preferred SNF  Pending - No Request Sent  [facility]  Pending - No Request Sent  [facility]  Pending - No Request Sent  [facility]  Pending - No Request Sent  [facility]  Pending - No Request Sent  [facility]  Pending - No Request Sent  [facility]REF[facility]ALF  Pending - No Request Sent  [facility]  Pending - No Request Sent  [facility]  Pending - No Request Sent  [facility]  Pending - No Request Sent  [facility]  Pending - No Request Sent  [facility]  Pending - No Request Sent  [facility]  Pending - No Request Sent  [facility]  Pending - No Request Sent  [facility]  Pending - No Request Sent  [facility]  Pending - No Request Sent  [facility]  Pending - No Request Sent  [facility]  Pending - No Request Sent  [facility]  Pending - No Request Sent  [facility]  Pending - No Request Sent  [facility]  Pending - No Request Sent  [facility]  Pending - No Request Sent  [facility]  Pending - No Request Sent  [facility]  Pending - No Request Sent  [facility]  Pending - No Request Sent  [facility]  Pending - No Request Sent  [facility]  Pending - No Request Sent  [facility]  Pending - No Request Sent  [facility]  Pending - No Request Sent  [facility]  Pending - No Request  Sent  [facility]  Pending - No Request Sent  [facility]  Pending - No Request Sent  [facility]  Pending - No Request Sent  [facility]  Pending - No Request Sent  [facility]  Pending - No Request Sent  [facility]  Pending - No Request Sent  [facility]  Pending - No Request Sent  [facility]  Pending - No Request Sent  [facility]  Pending - No Request Sent  [facility]  Pending - No Request Sent  [facility]  Pending - No Request Sent  [facility]  Pending - No Request Sent  [facility]  Pending - No Request Sent  [facility]  Pending - No Request Sent  [facility]  Pending - No Request Sent  [facility]  Pending - No Request Sent  [facility]  Pending - No Request Sent  [facility]  Pending - No Request Sent  [facility]  Pending - No Request Sent  [facility]  Pending - No Request Sent  [facility]  Pending - No Request Sent  [facility]  Pending - No Request Sent    Penni Homans, MSW, LCSW 07/01/2022 3:37 PM

## 2022-07-01 NOTE — Progress Notes (Signed)
Pt denies SI/HI/AVH and verbally agrees to approach staff if these become apparent or before harming themselves/others. Rates depression 0/10. Rates anxiety 0/10. Rates pain 0/10.   Scheduled medications administered to pt, per MD orders. RN provided support and encouragement to pt. Q15 min safety checks implemented and continued. Pt safe on the unit. RN will continue to monitor and intervene as needed.  07/01/22 0822  Psych Admission Type (Psych Patients Only)  Admission Status Involuntary  Psychosocial Assessment  Patient Complaints None  Eye Contact Fair  Facial Expression Flat  Affect Flat  Speech Logical/coherent  Interaction Isolative  Motor Activity Slow  Appearance/Hygiene Unremarkable;In scrubs  Behavior Characteristics Cooperative;Appropriate to situation;Calm  Mood Pleasant  Thought Process  Coherency WDL  Content WDL  Delusions None reported or observed  Perception WDL  Hallucination None reported or observed  Judgment WDL  Confusion None  Danger to Self  Current suicidal ideation? Denies  Danger to Others  Danger to Others None reported or observed

## 2022-07-01 NOTE — Progress Notes (Signed)
[facility] MD Progress Note  07/01/2022 4:07 PM [name]  MRN:  [id number] Subjective:  no new complaint. No change Principal Problem: Schizoaffective disorder, depressive type (HCC) Diagnosis: Principal Problem:   Schizoaffective disorder, depressive type (HCC)  Total Time spent with patient: 15 minutes  Past Psychiatric History: schizophrenia  Past Medical History:  Past Medical History:  Diagnosis Date   Anemia 10/02/2021   Non compliance w medication regimen    Schizophrenia [id number])     Past Surgical History:  Procedure Laterality Date   BIOPSY  09/25/2021   Procedure: BIOPSY;  Surgeon: Rachael Fee, MD;  Location: [facility];  Service: Gastroenterology;;   ESOPHAGOGASTRODUODENOSCOPY (EGD) WITH PROPOFOL [name] 09/25/2021   Procedure: ESOPHAGOGASTRODUODENOSCOPY (EGD) WITH PROPOFOL;  Surgeon: Rachael Fee, MD;  Location: [facility];  Service: Gastroenterology;  Laterality: [name];  With NGT placement   Family History: History reviewed. No pertinent family history. Family Psychiatric  History: none Social History:  Social History   Substance and Sexual Activity  Alcohol Use Not Currently   Comment: Refuses to disclose how much     Social History   Substance and Sexual Activity  Drug Use No   Comment: Refuses to answer    Social History   Socioeconomic History   Marital status: Single    Spouse name: Not on file   Number of children: Not on file   Years of education: Not on file   Highest education level: Not on file  Occupational History   Not on file  Tobacco Use   Smoking status: Every Day    Packs/day: 2.00    Types: Cigarettes   Smokeless tobacco: Never  Substance and Sexual Activity   Alcohol use: Not Currently    Comment: Refuses to disclose how much   Drug use: No    Comment: Refuses to answer   Sexual activity: Not Currently    Comment: refused to answer  Other Topics Concern   Not on file  Social History Narrative   Not on file   Social  Determinants of Health   Financial Resource Strain: Not on file  Food Insecurity: No Food Insecurity (04/08/2022)   Hunger Vital Sign    Worried About Running Out of Food in the Last Year: Never true    Ran Out of Food in the Last Year: Never true  Transportation Needs: No Transportation Needs (04/08/2022)   PRAPARE - Administrator, Civil Service (Medical): No    Lack of Transportation (Non-Medical): No  Physical Activity: Not on file  Stress: Not on file  Social Connections: Not on file   Additional Social History:                         Sleep: Fair  Appetite:  Fair  Current Medications: Current Facility-Administered Medications  Medication Dose Route Frequency Provider Last Rate Last Admin   acetaminophen (TYLENOL) tablet 650 mg  650 mg Oral Q6H PRN Sherri Levenhagen T, MD       alum & mag hydroxide-simeth (MAALOX/MYLANTA) 200-200-20 MG/5ML suspension 30 mL  30 mL Oral Q4H PRN Jamyah Folk T, MD       haloperidol (HALDOL) tablet 5 mg  5 mg Oral BID Kathy Wares, [name], MD   5 mg at 07/01/22 4627   Or   haloperidol lactate (HALDOL) injection 5 mg  5 mg Intramuscular BID Jailee Jaquez T, MD       lithium carbonate capsule 600 mg  600 mg Oral QHS Emett Stapel T, MD   600 mg at 06/30/22 2102   LORazepam (ATIVAN) tablet 1 mg  1 mg Oral BID Elizet Kaplan T, MD   1 mg at 07/01/22 8299   Or   LORazepam (ATIVAN) injection 1 mg  1 mg Intramuscular BID Sriram Febles T, MD       magnesium hydroxide (MILK OF MAGNESIA) suspension 30 mL  30 mL Oral Daily PRN Orah Sonnen T, MD       mirtazapine (REMERON) tablet 30 mg  30 mg Oral QHS Tawna Alwin T, MD   30 mg at 06/30/22 2102   multivitamin with minerals tablet 1 tablet  1 tablet Oral Daily Jeb Schloemer, [name], MD   1 tablet at 07/01/22 3716   QUEtiapine (SEROQUEL) tablet 400 mg  400 mg Oral QHS Tomekia Helton T, MD   400 mg at 06/30/22 2102   thiamine (VITAMIN B1) tablet 100 mg  100 mg Oral Daily Jahmari Esbenshade, [name], MD   100 mg at  07/01/22 9678    Lab Results: No results found for this or any previous visit (from the past 48 hour(s)).  Blood Alcohol level:  Lab Results  Component Value Date   ETH <10 03/29/2022   ETH <10 09/12/2021    Metabolic Disorder Labs: Lab Results  Component Value Date   HGBA1C 5.6 10/21/2021   MPG 114 10/21/2021   MPG 111 10/02/2021   Lab Results  Component Value Date   PROLACTIN 13.6 04/16/2015   Lab Results  Component Value Date   CHOL 189 10/21/2021   TRIG 52 10/21/2021   HDL 50 10/21/2021   CHOLHDL 3.8 10/21/2021   VLDL 10 10/21/2021   LDLCALC 129 (H) 10/21/2021   LDLCALC 93 10/01/2020    Physical Findings: AIMS: Facial and Oral Movements Muscles of Facial Expression: None, normal Lips and Perioral Area: None, normal Jaw: None, normal Tongue: None, normal,Extremity Movements Upper (arms, wrists, hands, fingers): None, normal Lower (legs, knees, ankles, toes): None, normal, Trunk Movements Neck, shoulders, hips: None, normal, Overall Severity Severity of abnormal movements (highest score from questions above): None, normal Incapacitation due to abnormal movements: None, normal Patient's awareness of abnormal movements (rate only patient's report): No Awareness, Dental Status Current problems with teeth and/or dentures?: No Does patient usually wear dentures?: No  CIWA:    COWS:     Musculoskeletal: Strength & Muscle Tone: within normal limits Gait & Station: normal Patient leans: [name]  Psychiatric Specialty Exam:  Presentation  General Appearance:  Disheveled  Eye Contact: Fair  Speech: Pressured  Speech Volume: Increased  Handedness: Right   Mood and Affect  Mood: Irritable; Labile; Angry  Affect: Blunt; Labile   Thought Process  Thought Processes: Disorganized; Irrevelant  Descriptions of Associations:Loose  Orientation:Partial  Thought Content:Illogical; Rumination; Scattered; Tangential; Delusions  History of  Schizophrenia/Schizoaffective disorder:Yes  Duration of Psychotic Symptoms:Greater than six months  Hallucinations:No data recorded Ideas of Reference:Percusatory; Paranoia  Suicidal Thoughts:No data recorded Homicidal Thoughts:No data recorded  Sensorium  Memory: Immediate Fair; Recent Fair; Remote Fair  Judgment: Impaired  Insight: Poor; Lacking   Executive Functions  Concentration: Poor  Attention Span: Poor  Recall: Poor  Fund of Knowledge: Poor  Language: Poor   Psychomotor Activity  Psychomotor Activity:No data recorded  Assets  Assets: Physical Health; Resilience; Housing; Social Support   Sleep  Sleep:No data recorded   Physical Exam: Physical Exam Constitutional:      Appearance: Normal appearance.  HENT:  Head: Normocephalic and atraumatic.     Mouth/Throat:     Pharynx: Oropharynx is clear.  Eyes:     Pupils: Pupils are equal, round, and reactive to light.  Cardiovascular:     Rate and Rhythm: Normal rate and regular rhythm.  Pulmonary:     Effort: Pulmonary effort is normal.     Breath sounds: Normal breath sounds.  Abdominal:     General: Abdomen is flat.     Palpations: Abdomen is soft.  Musculoskeletal:        General: Normal range of motion.  Skin:    General: Skin is warm and dry.  Neurological:     General: No focal deficit present.     Mental Status: She is alert. Mental status is at baseline.  Psychiatric:        Attention and Perception: She is inattentive.        Mood and Affect: Affect is blunt and inappropriate.        Speech: Speech is tangential.        Thought Content: Thought content is paranoid and delusional.        Cognition and Memory: Cognition is impaired.        Judgment: Judgment is inappropriate.    Review of Systems  Constitutional: Negative.   HENT: Negative.    Eyes: Negative.   Respiratory: Negative.    Cardiovascular: Negative.   Gastrointestinal: Negative.   Musculoskeletal:  Negative.   Skin: Negative.   Neurological: Negative.   Psychiatric/Behavioral: Negative.     Blood pressure 98/68, pulse (!) 58, temperature 98.4 F (36.9 C), temperature source Oral, resp. rate 18, height 5\' 4"  (1.626 m), weight 40.4 kg, SpO2 100 %. Body mass index is 15.28 kg/m.   Treatment Plan Summary: Medication management and Plan No change to medication or treatment plan. Patient continues to resist attempts at rational discussion. Team working on placement  , MD 07/01/2022, 4:07 PM

## 2022-07-02 DIAGNOSIS — F251 Schizoaffective disorder, depressive type: Secondary | ICD-10-CM | POA: Diagnosis not present

## 2022-07-02 NOTE — Group Note (Signed)
[facility] LCSW Group Therapy Note   Group Date: 07/02/2022 Start Time: 1300 End Time: 1400   Type of Therapy/Topic:  Group Therapy:  Emotion Regulation  Participation Level:  Did Not Attend   Mood:  Description of Group:    The purpose of this group is to assist patients in learning to regulate negative emotions and experience positive emotions. Patients will be guided to discuss ways in which they have been vulnerable to their negative emotions. These vulnerabilities will be juxtaposed with experiences of positive emotions or situations, and patients challenged to use positive emotions to combat negative ones. Special emphasis will be placed on coping with negative emotions in conflict situations, and patients will process healthy conflict resolution skills.  Therapeutic Goals: Patient will identify two positive emotions or experiences to reflect on in order to balance out negative emotions:  Patient will label two or more emotions that they find the most difficult to experience:  Patient will be able to demonstrate positive conflict resolution skills through discussion or role plays:   Summary of Patient Progress:   Patient did not attend group despite encouraged participation.      Therapeutic Modalities:   Cognitive Behavioral Therapy Feelings Identification Dialectical Behavioral Therapy   Mylon Mabey W Austen Wygant, LCSWA 

## 2022-07-02 NOTE — Progress Notes (Signed)
Recreation Therapy Notes    Date: 07/02/2022   Time: 09:45 am     Location: Craft room      Behavioral response: [name]   Intervention Topic: Coping skills   Discussion/Intervention: Patient refused to attend group.    Clinical Observations/Feedback:  Patient refused to attend group.    [name] LRT/CTRS        [name] 07/02/2022 10:19 AM 

## 2022-07-02 NOTE — Progress Notes (Signed)
[facility] MD Progress Note  07/02/2022 11:15 AM [name]  MRN:  [id number] Subjective: Follow-up 61 year old woman with schizoaffective disorder.  Patient seen and chart reviewed.  No change presentation.  Spends much of her time alone.  Gets agitated in any conversation about discharge planning.  Continues to express delusions.  Ability to interact appropriately around her treatment very limited Principal Problem: Schizoaffective disorder, depressive type ([place]) Diagnosis: Principal Problem:   Schizoaffective disorder, depressive type ([place])  Total Time spent with patient: 30 minutes  Past Psychiatric History: Past history of schizophrenia  Past Medical History:  Past Medical History:  Diagnosis Date   Anemia 10/02/2021   Non compliance w medication regimen    Schizophrenia [facility])     Past Surgical History:  Procedure Laterality Date   BIOPSY  09/25/2021   Procedure: BIOPSY;  Surgeon: Milus Banister, MD;  Location: [facility];  Service: Gastroenterology;;   ESOPHAGOGASTRODUODENOSCOPY (EGD) WITH PROPOFOL [name] 09/25/2021   Procedure: ESOPHAGOGASTRODUODENOSCOPY (EGD) WITH PROPOFOL;  Surgeon: Milus Banister, MD;  Location: [facility];  Service: Gastroenterology;  Laterality: [name];  With NGT placement   Family History: History reviewed. No pertinent family history. Family Psychiatric  History: See previous Social History:  Social History   Substance and Sexual Activity  Alcohol Use Not Currently   Comment: Refuses to disclose how much     Social History   Substance and Sexual Activity  Drug Use No   Comment: Refuses to answer    Social History   Socioeconomic History   Marital status: Single    Spouse name: Not on file   Number of children: Not on file   Years of education: Not on file   Highest education level: Not on file  Occupational History   Not on file  Tobacco Use   Smoking status: Every Day    Packs/day: 2.00    Types: Cigarettes   Smokeless tobacco: Never   Substance and Sexual Activity   Alcohol use: Not Currently    Comment: Refuses to disclose how much   Drug use: No    Comment: Refuses to answer   Sexual activity: Not Currently    Comment: refused to answer  Other Topics Concern   Not on file  Social History Narrative   Not on file   Social Determinants of Health   Financial Resource Strain: Not on file  Food Insecurity: No Food Insecurity (04/08/2022)   Hunger Vital Sign    Worried About Running Out of Food in the Last Year: Never true    Ran Out of Food in the Last Year: Never true  Transportation Needs: No Transportation Needs (04/08/2022)   PRAPARE - Hydrologist (Medical): No    Lack of Transportation (Non-Medical): No  Physical Activity: Not on file  Stress: Not on file  Social Connections: Not on file   Additional Social History:                         Sleep: Fair  Appetite:  Fair  Current Medications: Current Facility-Administered Medications  Medication Dose Route Frequency Provider Last Rate Last Admin   acetaminophen (TYLENOL) tablet 650 mg  650 mg Oral Q6H PRN Meris Reede T, MD       alum & mag hydroxide-simeth (MAALOX/MYLANTA) 200-200-20 MG/5ML suspension 30 mL  30 mL Oral Q4H PRN Ahron Hulbert T, MD       haloperidol (HALDOL) tablet 5 mg  5  mg Oral BID Jermey Closs, [name], MD   5 mg at 07/02/22 [id number]   Or   haloperidol lactate (HALDOL) injection 5 mg  5 mg Intramuscular BID Melony Tenpas T, MD       lithium carbonate capsule 600 mg  600 mg Oral QHS Matilde Pottenger T, MD   600 mg at 07/01/22 2054   LORazepam (ATIVAN) tablet 1 mg  1 mg Oral BID Avianna Moynahan T, MD   1 mg at 07/02/22 [id number]   Or   LORazepam (ATIVAN) injection 1 mg  1 mg Intramuscular BID Khyron Garno T, MD       magnesium hydroxide (MILK OF MAGNESIA) suspension 30 mL  30 mL Oral Daily PRN Qamar Rosman T, MD       mirtazapine (REMERON) tablet 30 mg  30 mg Oral QHS Keiyon Plack T, MD   30 mg at 07/01/22 2054    multivitamin with minerals tablet 1 tablet  1 tablet Oral Daily Amonte Brookover, [name], MD   1 tablet at 07/02/22 0827   QUEtiapine (SEROQUEL) tablet 400 mg  400 mg Oral QHS Venessa Wickham T, MD   400 mg at 07/01/22 2054   thiamine (VITAMIN B1) tablet 100 mg  100 mg Oral Daily Abdulraheem Pineo, [name], MD   100 mg at 07/02/22 [id number]    Lab Results: No results found for this or any previous visit (from the past 40 hour(s)).  Blood Alcohol level:  Lab Results  Component Value Date   ETH <10 03/29/2022   ETH <10 [id number]    Metabolic Disorder Labs: Lab Results  Component Value Date   HGBA1C 5.6 10/21/2021   MPG 114 10/21/2021   MPG 111 10/02/2021   Lab Results  Component Value Date   PROLACTIN 13.6 04/16/2015   Lab Results  Component Value Date   CHOL 189 10/21/2021   TRIG 52 10/21/2021   HDL 50 10/21/2021   CHOLHDL 3.8 10/21/2021   VLDL 10 10/21/2021   LDLCALC 129 (H) 10/21/2021   LDLCALC 93 10/01/2020    Physical Findings: AIMS: Facial and Oral Movements Muscles of Facial Expression: None, normal Lips and Perioral Area: None, normal Jaw: None, normal Tongue: None, normal,Extremity Movements Upper (arms, wrists, hands, fingers): None, normal Lower (legs, knees, ankles, toes): None, normal, Trunk Movements Neck, shoulders, hips: None, normal, Overall Severity Severity of abnormal movements (highest score from questions above): None, normal Incapacitation due to abnormal movements: None, normal Patient's awareness of abnormal movements (rate only patient's report): No Awareness, Dental Status Current problems with teeth and/or dentures?: No Does patient usually wear dentures?: No  CIWA:    COWS:     Musculoskeletal: Strength & Muscle Tone: within normal limits Gait & Station: normal Patient leans: [name]  Psychiatric Specialty Exam:  Presentation  General Appearance:  Disheveled  Eye Contact: Fair  Speech: Pressured  Speech  Volume: Increased  Handedness: Right   Mood and Affect  Mood: Irritable; Labile; Angry  Affect: Blunt; Labile   Thought Process  Thought Processes: Disorganized; Irrevelant  Descriptions of Associations:Loose  Orientation:Partial  Thought Content:Illogical; Rumination; Scattered; Tangential; Delusions  History of Schizophrenia/Schizoaffective disorder:Yes  Duration of Psychotic Symptoms:Greater than six months  Hallucinations:No data recorded Ideas of Reference:Percusatory; Paranoia  Suicidal Thoughts:No data recorded Homicidal Thoughts:No data recorded  Sensorium  Memory: Immediate Fair; Recent Fair; Remote Fair  Judgment: Impaired  Insight: Poor; Lacking   Executive Functions  Concentration: Poor  Attention Span: Poor  Recall: Poor  Fund of Knowledge: Poor  Language: Poor   Psychomotor Activity  Psychomotor Activity:No data recorded  Assets  Assets: Physical Health; Resilience; Housing; Social Support   Sleep  Sleep:No data recorded   Physical Exam: Physical Exam Vitals and nursing note reviewed.  Constitutional:      Appearance: Normal appearance.  HENT:     Head: Normocephalic and atraumatic.     Mouth/Throat:     Pharynx: Oropharynx is clear.  Eyes:     Pupils: Pupils are equal, round, and reactive to light.  Cardiovascular:     Rate and Rhythm: Normal rate and regular rhythm.  Pulmonary:     Effort: Pulmonary effort is normal.     Breath sounds: Normal breath sounds.  Abdominal:     General: Abdomen is flat.     Palpations: Abdomen is soft.  Musculoskeletal:        General: Normal range of motion.  Skin:    General: Skin is warm and dry.  Neurological:     General: No focal deficit present.     Mental Status: She is alert. Mental status is at baseline.  Psychiatric:        Attention and Perception: Attention normal.        Mood and Affect: Mood normal. Affect is blunt.        Speech: She is noncommunicative.  Speech is delayed.        Behavior: Behavior is uncooperative.        Thought Content: Thought content is paranoid.    Review of Systems  Constitutional: Negative.   HENT: Negative.    Eyes: Negative.   Respiratory: Negative.    Cardiovascular: Negative.   Gastrointestinal: Negative.   Musculoskeletal: Negative.   Skin: Negative.   Neurological: Negative.   Psychiatric/Behavioral:  The patient is nervous/anxious.    Blood pressure 111/72, pulse (!) 55, temperature 98 F (36.7 C), temperature source Oral, resp. rate 18, height 5\' 4"  (1.626 m), weight 40.4 kg, SpO2 100 %. Body mass index is 15.28 kg/m.   Treatment Plan Summary: Medication management and Plan no change to medication.  We seemed to be in a situation of needing to find a higher level placement.  We are struggling to do that.  Commitment has been renewed to allow for a longer time in the hospital.  Consideration being given to memory care units or other sort of locked placement if possible.  , MD 07/02/2022, 11:15 AM

## 2022-07-02 NOTE — Plan of Care (Signed)
D- Patient alert and oriented. Patient presented in a pleasant mood on assessment reporting that she slept good last night and had no complaints to voice to this Clinical research associate. Patient denied SI, HI, AVH, and pain at this time. Patient also denied any signs/symptoms of depression and anxiety. Per her self-inventory, patient's goal for today is to "discharge".  A- Scheduled medications administered to patient, per MD orders. Support and encouragement provided.  Routine safety checks conducted every 15 minutes.  Patient informed to notify staff with problems or concerns.  R- No adverse drug reactions noted. Patient contracts for safety at this time. Patient compliant with medications. Patient receptive, calm, and cooperative. Patient isolates to room, except for meals and medication. Patient remains safe at this time.  Problem: Education: Goal: Knowledge of General Education information will improve Description: Including pain rating scale, medication(s)/side effects and non-pharmacologic comfort measures Outcome: Progressing   Problem: Health Behavior/Discharge Planning: Goal: Ability to manage health-related needs will improve Outcome: Progressing   Problem: Clinical Measurements: Goal: Ability to maintain clinical measurements within normal limits will improve Outcome: Progressing Goal: Will remain free from infection Outcome: Progressing Goal: Diagnostic test results will improve Outcome: Progressing Goal: Respiratory complications will improve Outcome: Progressing Goal: Cardiovascular complication will be avoided Outcome: Progressing   Problem: Activity: Goal: Risk for activity intolerance will decrease Outcome: Progressing   Problem: Nutrition: Goal: Adequate nutrition will be maintained Outcome: Progressing   Problem: Coping: Goal: Level of anxiety will decrease Outcome: Progressing   Problem: Elimination: Goal: Will not experience complications related to bowel  motility Outcome: Progressing Goal: Will not experience complications related to urinary retention Outcome: Progressing   Problem: Pain Managment: Goal: General experience of comfort will improve Outcome: Progressing   Problem: Safety: Goal: Ability to remain free from injury will improve Outcome: Progressing   Problem: Skin Integrity: Goal: Risk for impaired skin integrity will decrease Outcome: Progressing   Problem: Activity: Goal: Will verbalize the importance of balancing activity with adequate rest periods Outcome: Progressing   Problem: Education: Goal: Will be free of psychotic symptoms Outcome: Progressing Goal: Knowledge of the prescribed therapeutic regimen will improve Outcome: Progressing   Problem: Coping: Goal: Coping ability will improve Outcome: Progressing Goal: Will verbalize feelings Outcome: Progressing   Problem: Health Behavior/Discharge Planning: Goal: Compliance with prescribed medication regimen will improve Outcome: Progressing   Problem: Nutritional: Goal: Ability to achieve adequate nutritional intake will improve Outcome: Progressing   Problem: Role Relationship: Goal: Ability to communicate needs accurately will improve Outcome: Progressing Goal: Ability to interact with others will improve Outcome: Progressing   Problem: Safety: Goal: Ability to redirect hostility and anger into socially appropriate behaviors will improve Outcome: Progressing Goal: Ability to remain free from injury will improve Outcome: Progressing   Problem: Self-Care: Goal: Ability to participate in self-care as condition permits will improve Outcome: Progressing   Problem: Self-Concept: Goal: Will verbalize positive feelings about self Outcome: Progressing   Problem: Education: Goal: Knowledge of [facility] Education information/materials will improve Outcome: Progressing Goal: Emotional status will improve Outcome: Progressing Goal: Mental  status will improve Outcome: Progressing Goal: Verbalization of understanding the information provided will improve Outcome: Progressing   Problem: Activity: Goal: Interest or engagement in activities will improve Outcome: Progressing Goal: Sleeping patterns will improve Outcome: Progressing   Problem: Coping: Goal: Ability to verbalize frustrations and anger appropriately will improve Outcome: Progressing Goal: Ability to demonstrate self-control will improve Outcome: Progressing   Problem: Health Behavior/Discharge Planning: Goal: Identification of resources available to  assist in meeting health care needs will improve Outcome: Progressing Goal: Compliance with treatment plan for underlying cause of condition will improve Outcome: Progressing   Problem: Physical Regulation: Goal: Ability to maintain clinical measurements within normal limits will improve Outcome: Progressing   Problem: Safety: Goal: Periods of time without injury will increase Outcome: Progressing

## 2022-07-03 DIAGNOSIS — F251 Schizoaffective disorder, depressive type: Secondary | ICD-10-CM | POA: Diagnosis not present

## 2022-07-03 NOTE — Progress Notes (Signed)
[facility] MD Progress Note  07/03/2022 2:38 PM [name]  MRN:  [id number] Subjective: Follow-up for this patient with schizoaffective disorder.  No change at all.  Stays in her room other than when she is out eating.  Gets up out of her chair to talk with me but has nothing to say.  Remains blunted and flat with thought blocking but gets paranoid and agitated when her family are brought up.  Unable to engage in conversation about treatment Principal Problem: Schizoaffective disorder, depressive type (HCC) Diagnosis: Principal Problem:   Schizoaffective disorder, depressive type (HCC)  Total Time spent with patient: 30 minutes  Past Psychiatric History: Past history of schizoaffective  Past Medical History:  Past Medical History:  Diagnosis Date   Anemia 10/02/2021   Non compliance w medication regimen    Schizophrenia [facility])     Past Surgical History:  Procedure Laterality Date   BIOPSY  09/25/2021   Procedure: BIOPSY;  Surgeon: Rachael Fee, MD;  Location: [facility];  Service: Gastroenterology;;   ESOPHAGOGASTRODUODENOSCOPY (EGD) WITH PROPOFOL [name] 09/25/2021   Procedure: ESOPHAGOGASTRODUODENOSCOPY (EGD) WITH PROPOFOL;  Surgeon: Rachael Fee, MD;  Location: [facility];  Service: Gastroenterology;  Laterality: [name];  With NGT placement   Family History: History reviewed. No pertinent family history. Family Psychiatric  History: See previous Social History:  Social History   Substance and Sexual Activity  Alcohol Use Not Currently   Comment: Refuses to disclose how much     Social History   Substance and Sexual Activity  Drug Use No   Comment: Refuses to answer    Social History   Socioeconomic History   Marital status: Single    Spouse name: Not on file   Number of children: Not on file   Years of education: Not on file   Highest education level: Not on file  Occupational History   Not on file  Tobacco Use   Smoking status: Every Day    Packs/day: 2.00     Types: Cigarettes   Smokeless tobacco: Never  Substance and Sexual Activity   Alcohol use: Not Currently    Comment: Refuses to disclose how much   Drug use: No    Comment: Refuses to answer   Sexual activity: Not Currently    Comment: refused to answer  Other Topics Concern   Not on file  Social History Narrative   Not on file   Social Determinants of Health   Financial Resource Strain: Not on file  Food Insecurity: No Food Insecurity (04/08/2022)   Hunger Vital Sign    Worried About Running Out of Food in the Last Year: Never true    Ran Out of Food in the Last Year: Never true  Transportation Needs: No Transportation Needs (04/08/2022)   PRAPARE - Administrator, Civil Service (Medical): No    Lack of Transportation (Non-Medical): No  Physical Activity: Not on file  Stress: Not on file  Social Connections: Not on file   Additional Social History:                         Sleep: Fair  Appetite:  Fair  Current Medications: Current Facility-Administered Medications  Medication Dose Route Frequency Provider Last Rate Last Admin   acetaminophen (TYLENOL) tablet 650 mg  650 mg Oral Q6H PRN Doyne Ellinger T, MD       alum & mag hydroxide-simeth (MAALOX/MYLANTA) 200-200-20 MG/5ML suspension 30 mL  30 mL  Oral Q4H PRN Tavien Chestnut, [name], MD       haloperidol (HALDOL) tablet 5 mg  5 mg Oral BID [place] Landgrebe T, MD   5 mg at 07/03/22 0830   Or   haloperidol lactate (HALDOL) injection 5 mg  5 mg Intramuscular BID Shalae Belmonte T, MD       lithium carbonate capsule 600 mg  600 mg Oral QHS Shaquina Gillham T, MD   600 mg at 07/02/22 2058   LORazepam (ATIVAN) tablet 1 mg  1 mg Oral BID Cleona Doubleday T, MD   1 mg at 07/03/22 0830   Or   LORazepam (ATIVAN) injection 1 mg  1 mg Intramuscular BID Peri Kreft T, MD       magnesium hydroxide (MILK OF MAGNESIA) suspension 30 mL  30 mL Oral Daily PRN Lyon Dumont T, MD       mirtazapine (REMERON) tablet 30 mg  30 mg Oral QHS  Jowel Waltner T, MD   30 mg at 07/02/22 2058   multivitamin with minerals tablet 1 tablet  1 tablet Oral Daily Gibbs Naugle, [name], MD   1 tablet at 07/03/22 0830   QUEtiapine (SEROQUEL) tablet 400 mg  400 mg Oral QHS Leontyne Manville T, MD   400 mg at 07/02/22 2059   thiamine (VITAMIN B1) tablet 100 mg  100 mg Oral Daily Garfield Coiner T, MD   100 mg at 07/03/22 0830    Lab Results: No results found for this or any previous visit (from the past 48 hour(s)).  Blood Alcohol level:  Lab Results  Component Value Date   ETH <10 03/29/2022   ETH <10 09/12/2021    Metabolic Disorder Labs: Lab Results  Component Value Date   HGBA1C 5.6 10/21/2021   MPG 114 10/21/2021   MPG 111 10/02/2021   Lab Results  Component Value Date   PROLACTIN 13.6 04/16/2015   Lab Results  Component Value Date   CHOL 189 10/21/2021   TRIG 52 10/21/2021   HDL 50 10/21/2021   CHOLHDL 3.8 10/21/2021   VLDL 10 10/21/2021   LDLCALC 129 (H) 10/21/2021   LDLCALC 93 10/01/2020    Physical Findings: AIMS: Facial and Oral Movements Muscles of Facial Expression: None, normal Lips and Perioral Area: None, normal Jaw: None, normal Tongue: None, normal,Extremity Movements Upper (arms, wrists, hands, fingers): None, normal Lower (legs, knees, ankles, toes): None, normal, Trunk Movements Neck, shoulders, hips: None, normal, Overall Severity Severity of abnormal movements (highest score from questions above): None, normal Incapacitation due to abnormal movements: None, normal Patient's awareness of abnormal movements (rate only patient's report): No Awareness, Dental Status Current problems with teeth and/or dentures?: No Does patient usually wear dentures?: No  CIWA:    COWS:     Musculoskeletal: Strength & Muscle Tone: within normal limits Gait & Station: normal Patient leans: [name]  Psychiatric Specialty Exam:  Presentation  General Appearance:  Disheveled  Eye Contact: Fair  Speech: Pressured  Speech  Volume: Increased  Handedness: Right   Mood and Affect  Mood: Irritable; Labile; Angry  Affect: Blunt; Labile   Thought Process  Thought Processes: Disorganized; Irrevelant  Descriptions of Associations:Loose  Orientation:Partial  Thought Content:Illogical; Rumination; Scattered; Tangential; Delusions  History of Schizophrenia/Schizoaffective disorder:Yes  Duration of Psychotic Symptoms:Greater than six months  Hallucinations:No data recorded Ideas of Reference:Percusatory; Paranoia  Suicidal Thoughts:No data recorded Homicidal Thoughts:No data recorded  Sensorium  Memory: Immediate Fair; Recent Fair; Remote Fair  Judgment: Impaired  Insight: Poor; Lacking  Executive Functions  Concentration: Poor  Attention Span: Poor  Recall: Poor  Fund of Knowledge: Poor  Language: Poor   Psychomotor Activity  Psychomotor Activity:No data recorded  Assets  Assets: Physical Health; Resilience; Housing; Social Support   Sleep  Sleep:No data recorded   Physical Exam: Physical Exam Vitals and nursing note reviewed.  Constitutional:      Appearance: Normal appearance.  HENT:     Head: Normocephalic and atraumatic.     Mouth/Throat:     Pharynx: Oropharynx is clear.  Eyes:     Pupils: Pupils are equal, round, and reactive to light.  Cardiovascular:     Rate and Rhythm: Normal rate and regular rhythm.  Pulmonary:     Effort: Pulmonary effort is normal.     Breath sounds: Normal breath sounds.  Abdominal:     General: Abdomen is flat.     Palpations: Abdomen is soft.  Musculoskeletal:        General: Normal range of motion.  Skin:    General: Skin is warm and dry.  Neurological:     General: No focal deficit present.     Mental Status: She is alert. Mental status is at baseline.  Psychiatric:        Attention and Perception: She is inattentive.        Mood and Affect: Mood normal. Affect is blunt.        Speech: Speech is delayed.         Behavior: Behavior is slowed.        Thought Content: Thought content is paranoid and delusional.        Cognition and Memory: Cognition is impaired.    Review of Systems  Constitutional: Negative.   HENT: Negative.    Eyes: Negative.   Respiratory: Negative.    Cardiovascular: Negative.   Gastrointestinal: Negative.   Musculoskeletal: Negative.   Skin: Negative.   Neurological: Negative.   Psychiatric/Behavioral: Negative.     Blood pressure 122/72, pulse (!) 54, temperature 98.3 F (36.8 C), temperature source Oral, resp. rate 18, height 5\' 4"  (1.626 m), weight 40.4 kg, SpO2 100 %. Body mass index is 15.28 kg/m.   Treatment Plan Summary: Medication management and Plan as mentioned before I had tried to discuss getting her on the clozapine but she refuses blood draws.  Remains very withdrawn and flat.  Did not show much improvement with any of the antipsychotics.  We are trying to find a safe discharge plan for her.  , MD 07/03/2022, 2:38 PM

## 2022-07-03 NOTE — Plan of Care (Signed)
D- Patient alert and oriented. Patient presented in a pleasant mood on assessment reporting that she slept good last night and had no complaints to voice to this Clinical research associate. Patient denied SI, HI, AVH, and pain at this time. Patient also denied any signs/symptoms of depression/anxiety. Patient's stated goal for today is to discharge.  A- Scheduled medications administered to patient, per MD orders. Support and encouragement provided.  Routine safety checks conducted every 15 minutes.  Patient informed to notify staff with problems or concerns.  R- No adverse drug reactions noted. Patient contracts for safety at this time. Patient compliant with medications and treatment plan. Patient receptive, calm, and cooperative. Patient isolates to room, except for meals and medication. Patient does come out to the dayroom at times, for a little while. Patient remains safe at this time.  Problem: Education: Goal: Knowledge of General Education information will improve Description: Including pain rating scale, medication(s)/side effects and non-pharmacologic comfort measures Outcome: Progressing   Problem: Health Behavior/Discharge Planning: Goal: Ability to manage health-related needs will improve Outcome: Progressing   Problem: Clinical Measurements: Goal: Ability to maintain clinical measurements within normal limits will improve Outcome: Progressing Goal: Will remain free from infection Outcome: Progressing Goal: Diagnostic test results will improve Outcome: Progressing Goal: Respiratory complications will improve Outcome: Progressing Goal: Cardiovascular complication will be avoided Outcome: Progressing   Problem: Activity: Goal: Risk for activity intolerance will decrease Outcome: Progressing   Problem: Nutrition: Goal: Adequate nutrition will be maintained Outcome: Progressing   Problem: Coping: Goal: Level of anxiety will decrease Outcome: Progressing   Problem: Elimination: Goal: Will  not experience complications related to bowel motility Outcome: Progressing Goal: Will not experience complications related to urinary retention Outcome: Progressing   Problem: Pain Managment: Goal: General experience of comfort will improve Outcome: Progressing   Problem: Safety: Goal: Ability to remain free from injury will improve Outcome: Progressing   Problem: Skin Integrity: Goal: Risk for impaired skin integrity will decrease Outcome: Progressing   Problem: Activity: Goal: Will verbalize the importance of balancing activity with adequate rest periods Outcome: Progressing   Problem: Education: Goal: Will be free of psychotic symptoms Outcome: Progressing Goal: Knowledge of the prescribed therapeutic regimen will improve Outcome: Progressing   Problem: Coping: Goal: Coping ability will improve Outcome: Progressing Goal: Will verbalize feelings Outcome: Progressing   Problem: Health Behavior/Discharge Planning: Goal: Compliance with prescribed medication regimen will improve Outcome: Progressing   Problem: Nutritional: Goal: Ability to achieve adequate nutritional intake will improve Outcome: Progressing   Problem: Role Relationship: Goal: Ability to communicate needs accurately will improve Outcome: Progressing Goal: Ability to interact with others will improve Outcome: Progressing   Problem: Safety: Goal: Ability to redirect hostility and anger into socially appropriate behaviors will improve Outcome: Progressing Goal: Ability to remain free from injury will improve Outcome: Progressing   Problem: Self-Care: Goal: Ability to participate in self-care as condition permits will improve Outcome: Progressing   Problem: Self-Concept: Goal: Will verbalize positive feelings about self Outcome: Progressing   Problem: Education: Goal: Knowledge of [place] [facility] Education information/materials will improve Outcome: Progressing Goal: Emotional status  will improve Outcome: Progressing Goal: Mental status will improve Outcome: Progressing Goal: Verbalization of understanding the information provided will improve Outcome: Progressing   Problem: Activity: Goal: Interest or engagement in activities will improve Outcome: Progressing Goal: Sleeping patterns will improve Outcome: Progressing   Problem: Coping: Goal: Ability to verbalize frustrations and anger appropriately will improve Outcome: Progressing Goal: Ability to demonstrate self-control will improve Outcome: Progressing  Problem: Health Behavior/Discharge Planning: Goal: Identification of resources available to assist in meeting health care needs will improve Outcome: Progressing Goal: Compliance with treatment plan for underlying cause of condition will improve Outcome: Progressing   Problem: Physical Regulation: Goal: Ability to maintain clinical measurements within normal limits will improve Outcome: Progressing   Problem: Safety: Goal: Periods of time without injury will increase Outcome: Progressing   

## 2022-07-03 NOTE — Group Note (Signed)
[facility] LCSW Group Therapy Note   Group Date: 07/03/2022 Start Time: 1300 End Time: 1400  Type of Therapy and Topic:  Group Therapy:  Feelings around Relapse and Recovery  Participation Level:  Did Not Attend    Description of Group:    Patients in this group will discuss emotions they experience before and after a relapse. They will process how experiencing these feelings, or avoidance of experiencing them, relates to having a relapse. Facilitator will guide patients to explore emotions they have related to recovery. Patients will be encouraged to process which emotions are more powerful. They will be guided to discuss the emotional reaction significant others in their lives may have to patients' relapse or recovery. Patients will be assisted in exploring ways to respond to the emotions of others without this contributing to a relapse.  Therapeutic Goals: Patient will identify two or more emotions that lead to relapse for them:  Patient will identify two emotions that result when they relapse:  Patient will identify two emotions related to recovery:  Patient will demonstrate ability to communicate their needs through discussion and/or role plays.   Summary of Patient Progress: X   Therapeutic Modalities:   Cognitive Behavioral Therapy Solution-Focused Therapy Assertiveness Training Relapse Prevention Therapy   Suhayla Chisom R Delanna Blacketer, LCSW 

## 2022-07-03 NOTE — Progress Notes (Signed)
Patient was cooperative with treatment, she denies SI, HI & AVH. No new behavioral issues to report on shift at this time.

## 2022-07-03 NOTE — Progress Notes (Signed)
Recreation Therapy Notes  Date: 07/03/2022  Time: 10:25 am    Location: Craft room     Behavioral response: [name]   Intervention Topic: Creative expressions   Discussion/Intervention: Patient refused to attend group.   Clinical Observations/Feedback:  Patient refused to attend group.    Hiroshi Krummel LRT/CTRS         Donal Lynam 07/03/2022 12:27 PM 

## 2022-07-04 DIAGNOSIS — F251 Schizoaffective disorder, depressive type: Secondary | ICD-10-CM | POA: Diagnosis not present

## 2022-07-04 NOTE — Progress Notes (Signed)
Patient was cooperative with treatment, she denies SI, HI & AVH. No new behavioral issues to report on shift at this time. She seemed to rest well through out the night.

## 2022-07-04 NOTE — Plan of Care (Signed)
Pt denies anxiety/depression at this time. Pt denies SI/HI/AVH or pain at this time. Pt is calm and cooperative. Pt is medication compliant. Pt provided with support and encouragement. Pt monitored q15 minutes for safety per unit policy. Plan of care ongoing.   Problem: Education: Goal: Knowledge of General Education information will improve Description: Including pain rating scale, medication(s)/side effects and non-pharmacologic comfort measures Outcome: Progressing   Problem: Coping: Goal: Level of anxiety will decrease Outcome: Progressing   

## 2022-07-04 NOTE — Progress Notes (Signed)
Pt denies SI/HI/AVH and verbally agrees to approach staff if these become apparent or before harming themselves/others. Rates depression 0/10. Rates anxiety 0/10. Rates pain 0/10. Scheduled medications administered to pt, per MD orders. RN provided support and encouragement to pt. Q15 min safety checks implemented and continued. Pt safe on the unit. RN will continue to monitor and intervene as needed.  07/04/22 0759  Psych Admission Type (Psych Patients Only)  Admission Status Involuntary  Psychosocial Assessment  Patient Complaints None  Eye Contact Fair  Facial Expression Flat  Affect Appropriate to circumstance  Speech Logical/coherent  Interaction Assertive  Motor Activity Slow  Appearance/Hygiene In scrubs  Behavior Characteristics Cooperative;Appropriate to situation;Calm  Mood Pleasant  Thought Process  Coherency WDL  Content WDL  Delusions None reported or observed  Perception WDL  Hallucination None reported or observed  Judgment WDL  Confusion None  Danger to Self  Current suicidal ideation? Denies  Danger to Others  Danger to Others None reported or observed

## 2022-07-04 NOTE — BH IP Treatment Plan (Signed)
Interdisciplinary Treatment and Diagnostic Plan Update  07/04/2022 Time of Session: 11:10am [name] MRN: [id number]  Principal Diagnosis: Schizoaffective disorder, depressive type (HCC)  Secondary Diagnoses: Principal Problem:   Schizoaffective disorder, depressive type (HCC)   Current Medications:  Current Facility-Administered Medications  Medication Dose Route Frequency Provider Last Rate Last Admin   acetaminophen (TYLENOL) tablet 650 mg  650 mg Oral Q6H PRN [name], [name], MD       alum & mag hydroxide-simeth (MAALOX/MYLANTA) 200-200-20 MG/5ML suspension 30 mL  30 mL Oral Q4H PRN [name], [name], MD       haloperidol (HALDOL) tablet 5 mg  5 mg Oral BID [name], [name], MD   5 mg at 07/04/22 7253   Or   haloperidol lactate (HALDOL) injection 5 mg  5 mg Intramuscular BID [name], [name], MD       lithium carbonate capsule 600 mg  600 mg Oral QHS [name], [name], MD   600 mg at 07/03/22 2113   LORazepam (ATIVAN) tablet 1 mg  1 mg Oral BID [name], [name], MD   1 mg at 07/04/22 6644   Or   LORazepam (ATIVAN) injection 1 mg  1 mg Intramuscular BID [name], [name], MD       magnesium hydroxide (MILK OF MAGNESIA) suspension 30 mL  30 mL Oral Daily PRN [name], [name], MD       mirtazapine (REMERON) tablet 30 mg  30 mg Oral QHS [name], [name], MD   30 mg at 07/03/22 2113   multivitamin with minerals tablet 1 tablet  1 tablet Oral Daily [name], [name], MD   1 tablet at 07/04/22 0759   QUEtiapine (SEROQUEL) tablet 400 mg  400 mg Oral QHS [name], [name], MD   400 mg at 07/03/22 2113   thiamine (VITAMIN B1) tablet 100 mg  100 mg Oral Daily [name], [name], MD   100 mg at 07/04/22 0759   PTA Medications: Medications Prior to Admission  Medication Sig Dispense Refill Last Dose   clonazePAM (KLONOPIN) 0.5 MG tablet Take 0.5 tablets (0.25 mg total) by mouth 2 (two) times daily. (Patient not taking: Reported on 03/30/2022) 60 tablet 0    haloperidol (HALDOL) 5 MG tablet Take 1 tablet (5  mg total) by mouth 2 (two) times daily. (Patient not taking: Reported on 03/30/2022) 60 tablet 3    midodrine (PROAMATINE) 2.5 MG tablet Take 1 tablet (2.5 mg total) by mouth 3 (three) times daily with meals. (Patient not taking: Reported on 03/30/2022)  0    mirtazapine (REMERON) 15 MG tablet Take 0.5 tablets (7.5 mg total) by mouth at bedtime. (Patient not taking: Reported on 03/30/2022) 30 tablet 2    Multiple Vitamin (MULTIVITAMIN WITH MINERALS) TABS tablet Take 1 tablet by mouth daily. (Patient not taking: Reported on 03/30/2022)      OLANZapine (ZYPREXA) 5 MG tablet Take 1 tablet (5 mg total) by mouth at bedtime. (Patient not taking: Reported on 03/30/2022) 30 tablet 2    thiamine 100 MG tablet Take 1 tablet (100 mg total) by mouth daily. (Patient not taking: Reported on 03/30/2022)       Patient Stressors: Marital or family conflict    Patient Strengths: Supportive family/friends   Treatment Modalities: Medication Management, Group therapy, Case management,  1 to 1 session with clinician, Psychoeducation, Recreational therapy.   Physician Treatment Plan for Primary Diagnosis: Schizoaffective disorder, depressive type (HCC) Long Term Goal(s): Improvement in symptoms so as ready for  discharge   Short Term Goals: Compliance with prescribed medications will improve Ability to verbalize feelings will improve Ability to demonstrate self-control will improve Ability to identify and develop effective coping behaviors will improve  Medication Management: Evaluate patient's response, side effects, and tolerance of medication regimen.  Therapeutic Interventions: 1 to 1 sessions, Unit Group sessions and Medication administration.  Evaluation of Outcomes: Progressing  Physician Treatment Plan for Secondary Diagnosis: Principal Problem:   Schizoaffective disorder, depressive type (HCC)  Long Term Goal(s): Improvement in symptoms so as ready for discharge   Short Term Goals: Compliance with  prescribed medications will improve Ability to verbalize feelings will improve Ability to demonstrate self-control will improve Ability to identify and develop effective coping behaviors will improve     Medication Management: Evaluate patient's response, side effects, and tolerance of medication regimen.  Therapeutic Interventions: 1 to 1 sessions, Unit Group sessions and Medication administration.  Evaluation of Outcomes: Progressing   RN Treatment Plan for Primary Diagnosis: Schizoaffective disorder, depressive type (HCC) Long Term Goal(s): Knowledge of disease and therapeutic regimen to maintain health will improve  Short Term Goals: Ability to remain free from injury will improve, Ability to verbalize frustration and anger appropriately will improve, Ability to demonstrate self-control, Ability to participate in decision making will improve, Ability to verbalize feelings will improve, Ability to disclose and discuss suicidal ideas, and Compliance with prescribed medications will improve  Medication Management: RN will administer medications as ordered by provider, will assess and evaluate patient's response and provide education to patient for prescribed medication. RN will report any adverse and/or side effects to prescribing provider.  Therapeutic Interventions: 1 on 1 counseling sessions, Psychoeducation, Medication administration, Evaluate responses to treatment, Monitor vital signs and CBGs as ordered, Perform/monitor CIWA, COWS, AIMS and Fall Risk screenings as ordered, Perform wound care treatments as ordered.  Evaluation of Outcomes: Progressing   LCSW Treatment Plan for Primary Diagnosis: Schizoaffective disorder, depressive type (HCC) Long Term Goal(s): Safe transition to appropriate next level of care at discharge, Engage patient in therapeutic group addressing interpersonal concerns.  Short Term Goals: Engage patient in aftercare planning with referrals and resources,  Increase social support, Increase ability to appropriately verbalize feelings, Increase emotional regulation, Facilitate acceptance of mental health diagnosis and concerns, and Increase skills for wellness and recovery  Therapeutic Interventions: Assess for all discharge needs, 1 to 1 time with Social worker, Explore available resources and support systems, Assess for adequacy in community support network, Educate family and significant other(s) on suicide prevention, Complete Psychosocial Assessment, Interpersonal group therapy.  Evaluation of Outcomes: Progressing   Progress in Treatment: Attending groups: No. Participating in groups: No. Taking medication as prescribed: Yes. Toleration medication: Yes. Family/Significant other contact made: Yes, individual(s) contacted:  SPE completed with the patient and with guardian.  Patient understands diagnosis: Yes. Discussing patient identified problems/goals with staff: Yes. Medical problems stabilized or resolved: Yes. Denies suicidal/homicidal ideation: Yes. Issues/concerns per patient self-inventory: No. Other: none   New problem(s) identified: No, Describe:  none  Update 06/29/2022: No changes at this time. Update 07/04/2022: No changes at this time.    New Short Term/Long Term Goal(s): Patient to work towards elimination of symptoms of psychosis, medication management for mood stabilization; elimination of SI thoughts; development of comprehensive mental wellness plan.  Update 06/29/2022: No changes at this time. Update 07/04/2022: No changes at this time.    Patient Goals:  No additional goals identified at this time. Patient to continue to work towards original goals identified  in initial treatment team meeting. CSW will remain available to patient should they voice additional treatment goals. Update 06/29/2022: No changes at this time. Update 07/04/2022: No changes at this time.    Discharge Plan or Barriers: Patient lacks adequate  housing/supervision at discharge. Patient's guardian refuses to pick patient up from hospital out of safety concerns. CSW team continuing to work with legal guardian to attempt to resolve concerns and identify adequate discharge plan. Update 06/29/2022: Guardian is seeking special assistance medicaid.  She has not completed the paperwork at last update.  Patient remains on the unit and safe, however, no plans have been developed for placement at discharge.  Patient lacks funding for group home, and can not afford out of pocket costs associated with Assisted Living. Update 07/04/2022: Currently seeking ALF/Group Home placement. [facility] reviewing for potential placement.    Reason for Continuation of Hospitalization: Other; describe Patient is psychiatrically clear. Patient is currently boarding.    Estimated Length of Stay: TBD Update 06/29/2022: TBD Update 07/04/2022: No changes at this time.   Last 3 [place] Suicide Severity Risk Score: Flowsheet Row Admission (Current) from 04/08/2022 in [facility] INPATIENT BEHAVIORAL MEDICINE Admission (Discharged) from 10/17/2021 in [facility] BEHAVIORAL MEDICINE Admission (Discharged) from 10/02/2021 in [facility]IONAL CARDIAC MED PCU  C-SSRS RISK CATEGORY No Risk No Risk No Risk       Last PHQ 2/9 Scores:     No data to display          Scribe for Treatment Team: Otelia Santee, LCSW 07/04/2022 11:13 AM

## 2022-07-04 NOTE — Plan of Care (Signed)
  Problem: Nutrition: Goal: Adequate nutrition will be maintained Outcome: Progressing   Problem: Coping: Goal: Level of anxiety will decrease Outcome: Progressing   

## 2022-07-04 NOTE — Progress Notes (Signed)
[facility] MD Progress Note  07/04/2022 2:59 PM [name]  MRN:  [id number] Subjective: [name] is seen on rounds.  No changes no complaints. Principal Problem: Schizoaffective disorder, depressive type (HCC) Diagnosis: Principal Problem:   Schizoaffective disorder, depressive type (HCC)  Total Time spent with patient: 15 minutes  Past Psychiatric History: History of schizophrenia  Past Medical History:  Past Medical History:  Diagnosis Date   Anemia 10/02/2021   Non compliance w medication regimen    Schizophrenia [facility])     Past Surgical History:  Procedure Laterality Date   BIOPSY  09/25/2021   Procedure: BIOPSY;  Surgeon: Rachael Fee, MD;  Location: [facility];  Service: Gastroenterology;;   ESOPHAGOGASTRODUODENOSCOPY (EGD) WITH PROPOFOL [name] 09/25/2021   Procedure: ESOPHAGOGASTRODUODENOSCOPY (EGD) WITH PROPOFOL;  Surgeon: Rachael Fee, MD;  Location: [facility];  Service: Gastroenterology;  Laterality: [name];  With NGT placement   Family History: History reviewed. No pertinent family history.  Social History:  Social History   Substance and Sexual Activity  Alcohol Use Not Currently   Comment: Refuses to disclose how much     Social History   Substance and Sexual Activity  Drug Use No   Comment: Refuses to answer    Social History   Socioeconomic History   Marital status: Single    Spouse name: Not on file   Number of children: Not on file   Years of education: Not on file   Highest education level: Not on file  Occupational History   Not on file  Tobacco Use   Smoking status: Every Day    Packs/day: 2.00    Types: Cigarettes   Smokeless tobacco: Never  Substance and Sexual Activity   Alcohol use: Not Currently    Comment: Refuses to disclose how much   Drug use: No    Comment: Refuses to answer   Sexual activity: Not Currently    Comment: refused to answer  Other Topics Concern   Not on file  Social History Narrative   Not on file   Social  Determinants of Health   Financial Resource Strain: Not on file  Food Insecurity: No Food Insecurity (04/08/2022)   Hunger Vital Sign    Worried About Running Out of Food in the Last Year: Never true    Ran Out of Food in the Last Year: Never true  Transportation Needs: No Transportation Needs (04/08/2022)   PRAPARE - Administrator, Civil Service (Medical): No    Lack of Transportation (Non-Medical): No  Physical Activity: Not on file  Stress: Not on file  Social Connections: Not on file   Additional Social History:                         Sleep: Good  Appetite:  Good  Current Medications: Current Facility-Administered Medications  Medication Dose Route Frequency Provider Last Rate Last Admin   acetaminophen (TYLENOL) tablet 650 mg  650 mg Oral Q6H PRN [name], [name], MD       alum & mag hydroxide-simeth (MAALOX/MYLANTA) 200-200-20 MG/5ML suspension 30 mL  30 mL Oral Q4H PRN [name], [name], MD       haloperidol (HALDOL) tablet 5 mg  5 mg Oral BID [name], [name], MD   5 mg at 07/04/22 8315   Or   haloperidol lactate (HALDOL) injection 5 mg  5 mg Intramuscular BID [name], [name], MD       lithium carbonate capsule  600 mg  600 mg Oral QHS [name], [name], MD   600 mg at 07/03/22 2113   LORazepam (ATIVAN) tablet 1 mg  1 mg Oral BID [name], [name], MD   1 mg at 07/04/22 6553   Or   LORazepam (ATIVAN) injection 1 mg  1 mg Intramuscular BID [name], [name], MD       magnesium hydroxide (MILK OF MAGNESIA) suspension 30 mL  30 mL Oral Daily PRN [name], [name], MD       mirtazapine (REMERON) tablet 30 mg  30 mg Oral QHS [name], [name], MD   30 mg at 07/03/22 2113   multivitamin with minerals tablet 1 tablet  1 tablet Oral Daily [name], [name], MD   1 tablet at 07/04/22 0759   QUEtiapine (SEROQUEL) tablet 400 mg  400 mg Oral QHS [name], [name], MD   400 mg at 07/03/22 2113   thiamine (VITAMIN B1) tablet 100 mg  100 mg Oral Daily [name], [name], MD   100 mg at  07/04/22 7482    Lab Results: No results found for this or any previous visit (from the past 48 hour(s)).  Blood Alcohol level:  Lab Results  Component Value Date   ETH <10 03/29/2022   ETH <10 09/12/2021    Metabolic Disorder Labs: Lab Results  Component Value Date   HGBA1C 5.6 10/21/2021   MPG 114 10/21/2021   MPG 111 10/02/2021   Lab Results  Component Value Date   PROLACTIN 13.6 04/16/2015   Lab Results  Component Value Date   CHOL 189 10/21/2021   TRIG 52 10/21/2021   HDL 50 10/21/2021   CHOLHDL 3.8 10/21/2021   VLDL 10 10/21/2021   LDLCALC 129 (H) 10/21/2021   LDLCALC 93 10/01/2020    Physical Findings: AIMS: Facial and Oral Movements Muscles of Facial Expression: None, normal Lips and Perioral Area: None, normal Jaw: None, normal Tongue: None, normal,Extremity Movements Upper (arms, wrists, hands, fingers): None, normal Lower (legs, knees, ankles, toes): None, normal, Trunk Movements Neck, shoulders, hips: None, normal, Overall Severity Severity of abnormal movements (highest score from questions above): None, normal Incapacitation due to abnormal movements: None, normal Patient's awareness of abnormal movements (rate only patient's report): No Awareness, Dental Status Current problems with teeth and/or dentures?: No Does patient usually wear dentures?: No  CIWA:    COWS:     Musculoskeletal: Strength & Muscle Tone: within normal limits Gait & Station: normal Patient leans: [name]  Psychiatric Specialty Exam:  Presentation  General Appearance:  Disheveled  Eye Contact: Fair  Speech: Pressured  Speech Volume: Increased  Handedness: Right   Mood and Affect  Mood: Irritable; Labile; Angry  Affect: Blunt; Labile   Thought Process  Thought Processes: Disorganized; Irrevelant  Descriptions of Associations:Loose  Orientation:Partial  Thought Content:Illogical; Rumination; Scattered; Tangential; Delusions  History of  Schizophrenia/Schizoaffective disorder:Yes  Duration of Psychotic Symptoms:Greater than six months  Hallucinations:No data recorded Ideas of Reference:Percusatory; Paranoia  Suicidal Thoughts:No data recorded Homicidal Thoughts:No data recorded  Sensorium  Memory: Immediate Fair; Recent Fair; Remote Fair  Judgment: Impaired  Insight: Poor; Lacking   Executive Functions  Concentration: Poor  Attention Span: Poor  Recall: Poor  Fund of Knowledge: Poor  Language: Poor   Psychomotor Activity  Psychomotor Activity:No data recorded  Assets  Assets: Physical Health; Resilience; Housing; Social Support   Sleep  Sleep:No data recorded    Blood pressure 107/66, pulse (!) 55, temperature 98.5 F (36.9 C), temperature  source Oral, resp. rate 18, height 5\' 4"  (1.626 m), weight 40.4 kg, SpO2 99 %. Body mass index is 15.28 kg/m.   Treatment Plan Summary: Daily contact with patient to assess and evaluate symptoms and progress in treatment, Medication management, and Plan continue current medications.  , DO 07/04/2022, 2:59 PM

## 2022-07-05 DIAGNOSIS — F251 Schizoaffective disorder, depressive type: Secondary | ICD-10-CM | POA: Diagnosis not present

## 2022-07-05 NOTE — [facility] Group Notes (Signed)
[facility] Group Notes:  (Nursing/MHT/Case Management/Adjunct)  Date:  07/05/2022  Time:  9:34 AM  Type of Therapy:   community meeting  Participation Level:  Did Not Attend    Rodena Goldmann 07/05/2022, 9:34 AM

## 2022-07-05 NOTE — Plan of Care (Signed)
  Problem: Education: Goal: Knowledge of General Education information will improve Description: Including pain rating scale, medication(s)/side effects and non-pharmacologic comfort measures Outcome: Progressing   Problem: Health Behavior/Discharge Planning: Goal: Ability to manage health-related needs will improve Outcome: Progressing   Problem: Clinical Measurements: Goal: Ability to maintain clinical measurements within normal limits will improve Outcome: Progressing Goal: Will remain free from infection Outcome: Progressing Goal: Diagnostic test results will improve Outcome: Progressing Goal: Respiratory complications will improve Outcome: Progressing Goal: Cardiovascular complication will be avoided Outcome: Progressing   Problem: Safety: Goal: Periods of time without injury will increase Outcome: Progressing   Problem: Physical Regulation: Goal: Ability to maintain clinical measurements within normal limits will improve Outcome: Progressing   Problem: Health Behavior/Discharge Planning: Goal: Identification of resources available to assist in meeting health care needs will improve Outcome: Progressing Goal: Compliance with treatment plan for underlying cause of condition will improve Outcome: Progressing   Problem: Coping: Goal: Ability to verbalize frustrations and anger appropriately will improve Outcome: Progressing Goal: Ability to demonstrate self-control will improve Outcome: Progressing   

## 2022-07-05 NOTE — Progress Notes (Signed)
Pt denies SI/HI/AVH and verbally agrees to approach staff if these become apparent or before harming themselves/others. Rates depression 0/10. Rates anxiety 0/10. Rates pain 0/10.  Scheduled medications administered to pt, per MD orders. RN provided support and encouragement to pt. Q15 min safety checks implemented and continued. Pt safe on the unit. RN will continue to monitor and intervene as needed.  07/05/22 0803  Psych Admission Type (Psych Patients Only)  Admission Status Involuntary  Psychosocial Assessment  Patient Complaints None  Eye Contact Fair  Facial Expression Flat  Affect Appropriate to circumstance  Speech Logical/coherent  Interaction Assertive  Motor Activity Slow  Appearance/Hygiene In scrubs;Unremarkable  Behavior Characteristics Cooperative;Appropriate to situation;Calm  Mood Pleasant  Thought Process  Coherency WDL  Content WDL  Delusions None reported or observed  Perception WDL  Hallucination None reported or observed  Judgment WDL  Confusion None  Danger to Self  Current suicidal ideation? Denies  Danger to Others  Danger to Others None reported or observed

## 2022-07-05 NOTE — Progress Notes (Signed)
[facility] MD Progress Note  07/05/2022 1:09 PM [name]  MRN:  [id number] Subjective: Patient is seen on rounds.  She does not have any complaints.  No issues and no concerns.  No side effects.  Mood and affect are stable. Principal Problem: Schizoaffective disorder, depressive type ([place]) Diagnosis: Principal Problem:   Schizoaffective disorder, depressive type ([place])  Total Time spent with patient: 15 minutes  Past Psychiatric History: History of schizophrenia  Past Medical History:  Past Medical History:  Diagnosis Date   Anemia 10/02/2021   Non compliance w medication regimen    Schizophrenia [facility])     Past Surgical History:  Procedure Laterality Date   BIOPSY  09/25/2021   Procedure: BIOPSY;  Surgeon: Milus Banister, MD;  Location: [facility];  Service: Gastroenterology;;   ESOPHAGOGASTRODUODENOSCOPY (EGD) WITH PROPOFOL [name] 09/25/2021   Procedure: ESOPHAGOGASTRODUODENOSCOPY (EGD) WITH PROPOFOL;  Surgeon: Milus Banister, MD;  Location: [facility];  Service: Gastroenterology;  Laterality: [name];  With NGT placement   Family History: History reviewed. No pertinent family history.   Social History:  Social History   Substance and Sexual Activity  Alcohol Use Not Currently   Comment: Refuses to disclose how much     Social History   Substance and Sexual Activity  Drug Use No   Comment: Refuses to answer    Social History   Socioeconomic History   Marital status: Single    Spouse name: Not on file   Number of children: Not on file   Years of education: Not on file   Highest education level: Not on file  Occupational History   Not on file  Tobacco Use   Smoking status: Every Day    Packs/day: 2.00    Types: Cigarettes   Smokeless tobacco: Never  Substance and Sexual Activity   Alcohol use: Not Currently    Comment: Refuses to disclose how much   Drug use: No    Comment: Refuses to answer   Sexual activity: Not Currently    Comment: refused to answer  Other  Topics Concern   Not on file  Social History Narrative   Not on file   Social Determinants of Health   Financial Resource Strain: Not on file  Food Insecurity: No Food Insecurity (04/08/2022)   Hunger Vital Sign    Worried About Running Out of Food in the Last Year: Never true    Ran Out of Food in the Last Year: Never true  Transportation Needs: No Transportation Needs (04/08/2022)   PRAPARE - Hydrologist (Medical): No    Lack of Transportation (Non-Medical): No  Physical Activity: Not on file  Stress: Not on file  Social Connections: Not on file   Additional Social History:                         Sleep: Good  Appetite:  Good  Current Medications: Current Facility-Administered Medications  Medication Dose Route Frequency Provider Last Rate Last Admin   acetaminophen (TYLENOL) tablet 650 mg  650 mg Oral Q6H PRN [name], [name], MD       alum & mag hydroxide-simeth (MAALOX/MYLANTA) 200-200-20 MG/5ML suspension 30 mL  30 mL Oral Q4H PRN [name], [name], MD       haloperidol (HALDOL) tablet 5 mg  5 mg Oral BID [name], [name], MD   5 mg at 07/05/22 [id number]   Or   haloperidol lactate (HALDOL) injection 5  mg  5 mg Intramuscular BID [name], [name], MD       lithium carbonate capsule 600 mg  600 mg Oral QHS [name], [name], MD   600 mg at 07/04/22 2102   LORazepam (ATIVAN) tablet 1 mg  1 mg Oral BID [name], [name], MD   1 mg at 07/05/22 2505   Or   LORazepam (ATIVAN) injection 1 mg  1 mg Intramuscular BID [name], [name], MD       magnesium hydroxide (MILK OF MAGNESIA) suspension 30 mL  30 mL Oral Daily PRN [name], [name], MD       mirtazapine (REMERON) tablet 30 mg  30 mg Oral QHS [name], [name], MD   30 mg at 07/04/22 2102   multivitamin with minerals tablet 1 tablet  1 tablet Oral Daily [name], [name], MD   1 tablet at 07/05/22 0803   QUEtiapine (SEROQUEL) tablet 400 mg  400 mg Oral QHS [name], [name], MD   400 mg at 07/04/22 2102    thiamine (VITAMIN B1) tablet 100 mg  100 mg Oral Daily [name], [name], MD   100 mg at 07/05/22 3976    Lab Results: No results found for this or any previous visit (from the past 48 hour(s)).  Blood Alcohol level:  Lab Results  Component Value Date   ETH <10 03/29/2022   ETH <10 09/12/2021    Metabolic Disorder Labs: Lab Results  Component Value Date   HGBA1C 5.6 10/21/2021   MPG 114 10/21/2021   MPG 111 10/02/2021   Lab Results  Component Value Date   PROLACTIN 13.6 04/16/2015   Lab Results  Component Value Date   CHOL 189 10/21/2021   TRIG 52 10/21/2021   HDL 50 10/21/2021   CHOLHDL 3.8 10/21/2021   VLDL 10 10/21/2021   LDLCALC 129 (H) 10/21/2021   LDLCALC 93 10/01/2020    Physical Findings: AIMS: Facial and Oral Movements Muscles of Facial Expression: None, normal Lips and Perioral Area: None, normal Jaw: None, normal Tongue: None, normal,Extremity Movements Upper (arms, wrists, hands, fingers): None, normal Lower (legs, knees, ankles, toes): None, normal, Trunk Movements Neck, shoulders, hips: None, normal, Overall Severity Severity of abnormal movements (highest score from questions above): None, normal Incapacitation due to abnormal movements: None, normal Patient's awareness of abnormal movements (rate only patient's report): No Awareness, Dental Status Current problems with teeth and/or dentures?: No Does patient usually wear dentures?: No  CIWA:    COWS:     Musculoskeletal: Strength & Muscle Tone: within normal limits Gait & Station: normal Patient leans: [name]  Psychiatric Specialty Exam:  Presentation  General Appearance:  Disheveled  Eye Contact: Fair  Speech: Pressured  Speech Volume: Increased  Handedness: Right   Mood and Affect  Mood: Irritable; Labile; Angry  Affect: Blunt; Labile   Thought Process  Thought Processes: Disorganized; Irrevelant  Descriptions of Associations:Loose  Orientation:Partial  Thought  Content:Illogical; Rumination; Scattered; Tangential; Delusions  History of Schizophrenia/Schizoaffective disorder:Yes  Duration of Psychotic Symptoms:Greater than six months  Hallucinations:No data recorded Ideas of Reference:Percusatory; Paranoia  Suicidal Thoughts:No data recorded Homicidal Thoughts:No data recorded  Sensorium  Memory: Immediate Fair; Recent Fair; Remote Fair  Judgment: Impaired  Insight: Poor; Lacking   Executive Functions  Concentration: Poor  Attention Span: Poor  Recall: Poor  Fund of Knowledge: Poor  Language: Poor   Psychomotor Activity  Psychomotor Activity:No data recorded  Assets  Assets: Physical Health; Resilience; Housing; Social Support   Sleep  Sleep:No data recorded    Blood pressure 120/73, pulse 64, temperature 97.8 F (36.6 C), temperature source Oral, resp. rate 18, height 5\' 4"  (1.626 m), weight 40.4 kg, SpO2 100 %. Body mass index is 15.28 kg/m.   Treatment Plan Summary: Daily contact with patient to assess and evaluate symptoms and progress in treatment, Medication management, and Plan continue current medications.  Parks Ranger, DO 07/05/2022, 1:09 PM

## 2022-07-05 NOTE — Progress Notes (Addendum)
Quiet and reserved. She has been isolative to her room. Does come out for snacks. She is med compliant and received her meds without issue. She denies si/hi/avh depression and anxiety at this encounter.  Q15 minute safety checks in place.   C Butler-Nicholson, LPN

## 2022-07-05 NOTE — Plan of Care (Signed)
  Problem: Nutrition: Goal: Adequate nutrition will be maintained Outcome: Adequate for Discharge   Problem: Coping: Goal: Level of anxiety will decrease Outcome: Adequate for Discharge   Problem: Pain Managment: Goal: General experience of comfort will improve Outcome: Adequate for Discharge   

## 2022-07-06 DIAGNOSIS — F251 Schizoaffective disorder, depressive type: Secondary | ICD-10-CM | POA: Diagnosis not present

## 2022-07-06 NOTE — Progress Notes (Signed)
Patient calm and pleasant during assessment denying SI/HI/AVH. Pt observed interacting appropriately with staff and peers on the unit. Pt compliant with medication administration per MD orders. Pt given education, support, and encouragement to be active in her treatment plan. Pt being monitored Q 15 minutes for safety per unit protocol, remains safe on the unit   

## 2022-07-06 NOTE — Group Note (Signed)
LCSW Group Therapy Note  Group Date: 07/06/2022 Start Time: 1300 End Time: 1400   Type of Therapy and Topic:  Group Therapy - How To Cope with Nervousness about Discharge   Participation Level:  Did Not Attend   Description of Group This process group involved identification of patients' feelings about discharge. Some of them are scheduled to be discharged soon, while others are new admissions, but each of them was asked to share thoughts and feelings surrounding discharge from the hospital. One common theme was that they are excited at the prospect of going home, while another was that many of them are apprehensive about sharing why they were hospitalized. Patients were given the opportunity to discuss these feelings with their peers in preparation for discharge.  Therapeutic Goals  Patient will identify their overall feelings about pending discharge. Patient will think about how they might proactively address issues that they believe will once again arise once they get home (i.e. with parents). Patients will participate in discussion about having hope for change.   Summary of Patient Progress:   Patient did not attend group despite encouraged participation.   Therapeutic Modalities Cognitive Behavioral Therapy   Tekisha Darcey W Jackie Russman, LCSWA 07/06/2022  2:23 PM   

## 2022-07-06 NOTE — Plan of Care (Signed)
D- Patient alert and oriented. Patient presented in a pleasant mood on assessment reporting that she slept good last night and had no complaints to voice to this Clinical research associate. Patient denied SI, HI, AVH, and pain at this time. Patient also denied any signs/symptoms of depression/anxiety. Patient's stated goal for today, per her self-inventory, is "discharge".  A- Scheduled medications administered to patient, per MD orders. Support and encouragement provided.  Routine safety checks conducted every 15 minutes.  Patient informed to notify staff with problems or concerns.  R- No adverse drug reactions noted. Patient contracts for safety at this time. Patient compliant with medications and treatment plan. Patient receptive, calm, and cooperative. Patient has been more present in the milieu today by sitting out in the dayroom, throughout the day, not just coming out for meals. Patient remains safe at this time.  Problem: Education: Goal: Knowledge of General Education information will improve Description: Including pain rating scale, medication(s)/side effects and non-pharmacologic comfort measures Outcome: Progressing   Problem: Health Behavior/Discharge Planning: Goal: Ability to manage health-related needs will improve Outcome: Progressing   Problem: Clinical Measurements: Goal: Ability to maintain clinical measurements within normal limits will improve Outcome: Progressing Goal: Will remain free from infection Outcome: Progressing Goal: Diagnostic test results will improve Outcome: Progressing Goal: Respiratory complications will improve Outcome: Progressing Goal: Cardiovascular complication will be avoided Outcome: Progressing   Problem: Activity: Goal: Risk for activity intolerance will decrease Outcome: Progressing   Problem: Nutrition: Goal: Adequate nutrition will be maintained Outcome: Progressing   Problem: Coping: Goal: Level of anxiety will decrease Outcome: Progressing    Problem: Elimination: Goal: Will not experience complications related to bowel motility Outcome: Progressing Goal: Will not experience complications related to urinary retention Outcome: Progressing   Problem: Pain Managment: Goal: General experience of comfort will improve Outcome: Progressing   Problem: Safety: Goal: Ability to remain free from injury will improve Outcome: Progressing   Problem: Skin Integrity: Goal: Risk for impaired skin integrity will decrease Outcome: Progressing   Problem: Activity: Goal: Will verbalize the importance of balancing activity with adequate rest periods Outcome: Progressing   Problem: Education: Goal: Will be free of psychotic symptoms Outcome: Progressing Goal: Knowledge of the prescribed therapeutic regimen will improve Outcome: Progressing   Problem: Coping: Goal: Coping ability will improve Outcome: Progressing Goal: Will verbalize feelings Outcome: Progressing   Problem: Health Behavior/Discharge Planning: Goal: Compliance with prescribed medication regimen will improve Outcome: Progressing   Problem: Nutritional: Goal: Ability to achieve adequate nutritional intake will improve Outcome: Progressing   Problem: Role Relationship: Goal: Ability to communicate needs accurately will improve Outcome: Progressing Goal: Ability to interact with others will improve Outcome: Progressing   Problem: Safety: Goal: Ability to redirect hostility and anger into socially appropriate behaviors will improve Outcome: Progressing Goal: Ability to remain free from injury will improve Outcome: Progressing   Problem: Self-Care: Goal: Ability to participate in self-care as condition permits will improve Outcome: Progressing   Problem: Self-Concept: Goal: Will verbalize positive feelings about self Outcome: Progressing   Problem: Education: Goal: Knowledge of  General Education information/materials will improve Outcome:  Progressing Goal: Emotional status will improve Outcome: Progressing Goal: Mental status will improve Outcome: Progressing Goal: Verbalization of understanding the information provided will improve Outcome: Progressing   Problem: Activity: Goal: Interest or engagement in activities will improve Outcome: Progressing Goal: Sleeping patterns will improve Outcome: Progressing   Problem: Coping: Goal: Ability to verbalize frustrations and anger appropriately will improve Outcome: Progressing Goal: Ability to demonstrate self-control  will improve Outcome: Progressing   Problem: Health Behavior/Discharge Planning: Goal: Identification of resources available to assist in meeting health care needs will improve Outcome: Progressing Goal: Compliance with treatment plan for underlying cause of condition will improve Outcome: Progressing   Problem: Physical Regulation: Goal: Ability to maintain clinical measurements within normal limits will improve Outcome: Progressing   Problem: Safety: Goal: Periods of time without injury will increase Outcome: Progressing

## 2022-07-06 NOTE — Progress Notes (Signed)
[facility] MD Progress Note  07/06/2022 2:54 PM [name]  MRN:  [id number] Subjective: Patient has no new complaints.  Continues to have little ability to engage in appropriate conversation.  Wants to go home but cannot discuss the barriers to discharge Principal Problem: Schizoaffective disorder, depressive type ([facility]) Diagnosis: Principal Problem:   Schizoaffective disorder, depressive type ([facility])  Total Time spent with patient: 20 minutes  Past Psychiatric History: Schizoaffective disorder  Past Medical History:  Past Medical History:  Diagnosis Date   Anemia 10/02/2021   Non compliance w medication regimen    Schizophrenia [facility])     Past Surgical History:  Procedure Laterality Date   BIOPSY  09/25/2021   Procedure: BIOPSY;  Surgeon: Rachael Fee, MD;  Location: [facility];  Service: Gastroenterology;;   ESOPHAGOGASTRODUODENOSCOPY (EGD) WITH PROPOFOL [name] 09/25/2021   Procedure: ESOPHAGOGASTRODUODENOSCOPY (EGD) WITH PROPOFOL;  Surgeon: Rachael Fee, MD;  Location: [facility];  Service: Gastroenterology;  Laterality: [name];  With NGT placement   Family History: History reviewed. No pertinent family history. Family Psychiatric  History: See previous Social History:  Social History   Substance and Sexual Activity  Alcohol Use Not Currently   Comment: Refuses to disclose how much     Social History   Substance and Sexual Activity  Drug Use No   Comment: Refuses to answer    Social History   Socioeconomic History   Marital status: Single    Spouse name: Not on file   Number of children: Not on file   Years of education: Not on file   Highest education level: Not on file  Occupational History   Not on file  Tobacco Use   Smoking status: Every Day    Packs/day: 2.00    Types: Cigarettes   Smokeless tobacco: Never  Substance and Sexual Activity   Alcohol use: Not Currently    Comment: Refuses to disclose how much   Drug use: No    Comment: Refuses to answer    Sexual activity: Not Currently    Comment: refused to answer  Other Topics Concern   Not on file  Social History Narrative   Not on file   Social Determinants of Health   Financial Resource Strain: Not on file  Food Insecurity: No Food Insecurity (04/08/2022)   Hunger Vital Sign    Worried About Running Out of Food in the Last Year: Never true    Ran Out of Food in the Last Year: Never true  Transportation Needs: No Transportation Needs (04/08/2022)   PRAPARE - Administrator, Civil Service (Medical): No    Lack of Transportation (Non-Medical): No  Physical Activity: Not on file  Stress: Not on file  Social Connections: Not on file   Additional Social History:                         Sleep: Fair  Appetite:  Fair  Current Medications: Current Facility-Administered Medications  Medication Dose Route Frequency Provider Last Rate Last Admin   acetaminophen (TYLENOL) tablet 650 mg  650 mg Oral Q6H PRN Shequilla Goodgame T, MD       alum & mag hydroxide-simeth (MAALOX/MYLANTA) 200-200-20 MG/5ML suspension 30 mL  30 mL Oral Q4H PRN Shawndell Varas T, MD       haloperidol (HALDOL) tablet 5 mg  5 mg Oral BID Quinette Hentges, [name], MD   5 mg at 07/06/22 4431   Or   haloperidol lactate (  HALDOL) injection 5 mg  5 mg Intramuscular BID Pascal Stiggers T, MD       lithium carbonate capsule 600 mg  600 mg Oral QHS Andranik Jeune T, MD   600 mg at 07/05/22 2128   LORazepam (ATIVAN) tablet 1 mg  1 mg Oral BID Rayshard Schirtzinger T, MD   1 mg at 07/06/22 5053   Or   LORazepam (ATIVAN) injection 1 mg  1 mg Intramuscular BID Najwa Spillane T, MD       magnesium hydroxide (MILK OF MAGNESIA) suspension 30 mL  30 mL Oral Daily PRN Rayya Yagi T, MD       mirtazapine (REMERON) tablet 30 mg  30 mg Oral QHS Mead Slane T, MD   30 mg at 07/05/22 2129   multivitamin with minerals tablet 1 tablet  1 tablet Oral Daily Cozy Veale, [name], MD   1 tablet at 07/06/22 9767   QUEtiapine (SEROQUEL) tablet 400 mg   400 mg Oral QHS Opel Lejeune T, MD   400 mg at 07/05/22 2129   thiamine (VITAMIN B1) tablet 100 mg  100 mg Oral Daily Malyia Moro, [name], MD   100 mg at 07/06/22 3419    Lab Results: No results found for this or any previous visit (from the past 48 hour(s)).  Blood Alcohol level:  Lab Results  Component Value Date   ETH <10 03/29/2022   ETH <10 09/12/2021    Metabolic Disorder Labs: Lab Results  Component Value Date   HGBA1C 5.6 10/21/2021   MPG 114 10/21/2021   MPG 111 10/02/2021   Lab Results  Component Value Date   PROLACTIN 13.6 04/16/2015   Lab Results  Component Value Date   CHOL 189 10/21/2021   TRIG 52 10/21/2021   HDL 50 10/21/2021   CHOLHDL 3.8 10/21/2021   VLDL 10 10/21/2021   LDLCALC 129 (H) 10/21/2021   LDLCALC 93 10/01/2020    Physical Findings: AIMS: Facial and Oral Movements Muscles of Facial Expression: None, normal Lips and Perioral Area: None, normal Jaw: None, normal Tongue: None, normal,Extremity Movements Upper (arms, wrists, hands, fingers): None, normal Lower (legs, knees, ankles, toes): None, normal, Trunk Movements Neck, shoulders, hips: None, normal, Overall Severity Severity of abnormal movements (highest score from questions above): None, normal Incapacitation due to abnormal movements: None, normal Patient's awareness of abnormal movements (rate only patient's report): No Awareness, Dental Status Current problems with teeth and/or dentures?: No Does patient usually wear dentures?: No  CIWA:    COWS:     Musculoskeletal: Strength & Muscle Tone: within normal limits Gait & Station: normal Patient leans: [name]  Psychiatric Specialty Exam:  Presentation  General Appearance:  Disheveled  Eye Contact: Fair  Speech: Pressured  Speech Volume: Increased  Handedness: Right   Mood and Affect  Mood: Irritable; Labile; Angry  Affect: Blunt; Labile   Thought Process  Thought Processes: Disorganized;  Irrevelant  Descriptions of Associations:Loose  Orientation:Partial  Thought Content:Illogical; Rumination; Scattered; Tangential; Delusions  History of Schizophrenia/Schizoaffective disorder:Yes  Duration of Psychotic Symptoms:Greater than six months  Hallucinations:No data recorded Ideas of Reference:Percusatory; Paranoia  Suicidal Thoughts:No data recorded Homicidal Thoughts:No data recorded  Sensorium  Memory: Immediate Fair; Recent Fair; Remote Fair  Judgment: Impaired  Insight: Poor; Lacking   Executive Functions  Concentration: Poor  Attention Span: Poor  Recall: Poor  Fund of Knowledge: Poor  Language: Poor   Psychomotor Activity  Psychomotor Activity:No data recorded  Assets  Assets: Physical Health; Resilience; Housing; Social Support  Sleep  Sleep:No data recorded   Physical Exam: Physical Exam Vitals reviewed.  Constitutional:      Appearance: Normal appearance.  HENT:     Head: Normocephalic and atraumatic.     Mouth/Throat:     Pharynx: Oropharynx is clear.  Eyes:     Pupils: Pupils are equal, round, and reactive to light.  Cardiovascular:     Rate and Rhythm: Normal rate and regular rhythm.  Pulmonary:     Effort: Pulmonary effort is normal.     Breath sounds: Normal breath sounds.  Abdominal:     General: Abdomen is flat.     Palpations: Abdomen is soft.  Musculoskeletal:        General: Normal range of motion.  Skin:    General: Skin is warm and dry.  Neurological:     General: No focal deficit present.     Mental Status: She is alert. Mental status is at baseline.  Psychiatric:        Attention and Perception: She is inattentive.        Mood and Affect: Mood normal. Affect is blunt.        Speech: Speech is delayed.        Behavior: Behavior is slowed.        Thought Content: Thought content is paranoid.    Review of Systems  Constitutional: Negative.   HENT: Negative.    Eyes: Negative.   Respiratory:  Negative.    Cardiovascular: Negative.   Gastrointestinal: Negative.   Musculoskeletal: Negative.   Skin: Negative.   Neurological: Negative.   Psychiatric/Behavioral: Negative.     Blood pressure 108/69, pulse (!) 58, temperature 98.3 F (36.8 C), temperature source Oral, resp. rate 18, height 5\' 4"  (1.626 m), weight 40.4 kg, SpO2 99 %. Body mass index is 15.28 kg/m.   Treatment Plan Summary: Plan no change to medicine or treatment plan.  Appears to be stable.  Refusing any attempt at trying to initiate clozapine.  Guardians refusing to take her home.  Discharge planning is stuck for now.  , MD 07/06/2022, 2:54 PM

## 2022-07-06 NOTE — Progress Notes (Signed)
Recreation Therapy Notes  Date: 07/06/2022   Time: 10:25 am     Location: Court yard    Behavioral response: [name]   Intervention Topic: Honesty    Discussion/Intervention: Patient refused to attend group.    Clinical Observations/Feedback:  Patient refused to attend group.    Pierce Biagini LRT/CTRS        [name] 07/06/2022 10:37 AM 

## 2022-07-07 DIAGNOSIS — F251 Schizoaffective disorder, depressive type: Secondary | ICD-10-CM | POA: Diagnosis not present

## 2022-07-07 NOTE — Progress Notes (Signed)
Recreation Therapy Notes   Date: 07/07/2022  Time: 10:05 am    Location: Craft room     Behavioral response: [name]   Intervention Topic: Teamwork    Discussion/Intervention: Patient refused to attend group.   Clinical Observations/Feedback:  Patient refused to attend group.    Ainhoa Rallo LRT/CTRS        Bernardino Dowell 07/07/2022 11:08 AM

## 2022-07-07 NOTE — Progress Notes (Signed)
[facility] MD Progress Note  07/07/2022 12:59 PM [name]  MRN:  [id number] Subjective: Follow-up patient with schizoaffective disorder.  No change in presentation.  Mostly isolated although she eats enough.  Health seems stable.  Unable to engage in rational conversation about treatment plan or discharge plan Principal Problem: Schizoaffective disorder, depressive type (HCC) Diagnosis: Principal Problem:   Schizoaffective disorder, depressive type (HCC)  Total Time spent with patient: 30 minutes  Past Psychiatric History: Past history of chronic schizoaffective disorder  Past Medical History:  Past Medical History:  Diagnosis Date   Anemia 10/02/2021   Non compliance w medication regimen    Schizophrenia [facility])     Past Surgical History:  Procedure Laterality Date   BIOPSY  09/25/2021   Procedure: BIOPSY;  Surgeon: Rachael Fee, MD;  Location: [facility];  Service: Gastroenterology;;   ESOPHAGOGASTRODUODENOSCOPY (EGD) WITH PROPOFOL [name] 09/25/2021   Procedure: ESOPHAGOGASTRODUODENOSCOPY (EGD) WITH PROPOFOL;  Surgeon: Rachael Fee, MD;  Location: [facility];  Service: Gastroenterology;  Laterality: [name];  With NGT placement   Family History: History reviewed. No pertinent family history. Family Psychiatric  History: See previous Social History:  Social History   Substance and Sexual Activity  Alcohol Use Not Currently   Comment: Refuses to disclose how much     Social History   Substance and Sexual Activity  Drug Use No   Comment: Refuses to answer    Social History   Socioeconomic History   Marital status: Single    Spouse name: Not on file   Number of children: Not on file   Years of education: Not on file   Highest education level: Not on file  Occupational History   Not on file  Tobacco Use   Smoking status: Every Day    Packs/day: 2.00    Types: Cigarettes   Smokeless tobacco: Never  Substance and Sexual Activity   Alcohol use: Not Currently    Comment:  Refuses to disclose how much   Drug use: No    Comment: Refuses to answer   Sexual activity: Not Currently    Comment: refused to answer  Other Topics Concern   Not on file  Social History Narrative   Not on file   Social Determinants of Health   Financial Resource Strain: Not on file  Food Insecurity: No Food Insecurity (04/08/2022)   Hunger Vital Sign    Worried About Running Out of Food in the Last Year: Never true    Ran Out of Food in the Last Year: Never true  Transportation Needs: No Transportation Needs (04/08/2022)   PRAPARE - Administrator, Civil Service (Medical): No    Lack of Transportation (Non-Medical): No  Physical Activity: Not on file  Stress: Not on file  Social Connections: Not on file   Additional Social History:                         Sleep: Fair  Appetite:  Fair  Current Medications: Current Facility-Administered Medications  Medication Dose Route Frequency Provider Last Rate Last Admin   acetaminophen (TYLENOL) tablet 650 mg  650 mg Oral Q6H PRN Shakyia Bosso T, MD       alum & mag hydroxide-simeth (MAALOX/MYLANTA) 200-200-20 MG/5ML suspension 30 mL  30 mL Oral Q4H PRN Larcenia Holaday T, MD       haloperidol (HALDOL) tablet 5 mg  5 mg Oral BID Waleed Dettman, [name], MD   5 mg  at 07/07/22 0818   Or   haloperidol lactate (HALDOL) injection 5 mg  5 mg Intramuscular BID Caresse Sedivy T, MD       lithium carbonate capsule 600 mg  600 mg Oral QHS Mikaili Flippin T, MD   600 mg at 07/06/22 2107   LORazepam (ATIVAN) tablet 1 mg  1 mg Oral BID Amatullah Christy T, MD   1 mg at 07/07/22 1017   Or   LORazepam (ATIVAN) injection 1 mg  1 mg Intramuscular BID Martena Emanuele T, MD       magnesium hydroxide (MILK OF MAGNESIA) suspension 30 mL  30 mL Oral Daily PRN Joslyn Ramos T, MD       mirtazapine (REMERON) tablet 30 mg  30 mg Oral QHS Aslan Himes T, MD   30 mg at 07/06/22 2107   multivitamin with minerals tablet 1 tablet  1 tablet Oral Daily Asif Muchow,  [name], MD   1 tablet at 07/07/22 0818   QUEtiapine (SEROQUEL) tablet 400 mg  400 mg Oral QHS Shelma Eiben T, MD   400 mg at 07/06/22 2107   thiamine (VITAMIN B1) tablet 100 mg  100 mg Oral Daily Jontae Sonier, [name], MD   100 mg at 07/07/22 5102    Lab Results: No results found for this or any previous visit (from the past 48 hour(s)).  Blood Alcohol level:  Lab Results  Component Value Date   ETH <10 03/29/2022   ETH <10 09/12/2021    Metabolic Disorder Labs: Lab Results  Component Value Date   HGBA1C 5.6 10/21/2021   MPG 114 10/21/2021   MPG 111 10/02/2021   Lab Results  Component Value Date   PROLACTIN 13.6 04/16/2015   Lab Results  Component Value Date   CHOL 189 10/21/2021   TRIG 52 10/21/2021   HDL 50 10/21/2021   CHOLHDL 3.8 10/21/2021   VLDL 10 10/21/2021   LDLCALC 129 (H) 10/21/2021   LDLCALC 93 10/01/2020    Physical Findings: AIMS: Facial and Oral Movements Muscles of Facial Expression: None, normal Lips and Perioral Area: None, normal Jaw: None, normal Tongue: None, normal,Extremity Movements Upper (arms, wrists, hands, fingers): None, normal Lower (legs, knees, ankles, toes): None, normal, Trunk Movements Neck, shoulders, hips: None, normal, Overall Severity Severity of abnormal movements (highest score from questions above): None, normal Incapacitation due to abnormal movements: None, normal Patient's awareness of abnormal movements (rate only patient's report): No Awareness, Dental Status Current problems with teeth and/or dentures?: No Does patient usually wear dentures?: No  CIWA:    COWS:     Musculoskeletal: Strength & Muscle Tone: within normal limits Gait & Station: normal Patient leans: [name]  Psychiatric Specialty Exam:  Presentation  General Appearance:  Disheveled  Eye Contact: Fair  Speech: Pressured  Speech Volume: Increased  Handedness: Right   Mood and Affect  Mood: Irritable; Labile; Angry  Affect: Blunt;  Labile   Thought Process  Thought Processes: Disorganized; Irrevelant  Descriptions of Associations:Loose  Orientation:Partial  Thought Content:Illogical; Rumination; Scattered; Tangential; Delusions  History of Schizophrenia/Schizoaffective disorder:Yes  Duration of Psychotic Symptoms:Greater than six months  Hallucinations:No data recorded Ideas of Reference:Percusatory; Paranoia  Suicidal Thoughts:No data recorded Homicidal Thoughts:No data recorded  Sensorium  Memory: Immediate Fair; Recent Fair; Remote Fair  Judgment: Impaired  Insight: Poor; Lacking   Executive Functions  Concentration: Poor  Attention Span: Poor  Recall: Poor  Fund of Knowledge: Poor  Language: Poor   Psychomotor Activity  Psychomotor Activity:No data recorded  Assets  Assets: Physical Health; Resilience; Housing; Social Support   Sleep  Sleep:No data recorded   Physical Exam: Physical Exam Vitals reviewed.  Constitutional:      Appearance: Normal appearance.  HENT:     Head: Normocephalic and atraumatic.     Mouth/Throat:     Pharynx: Oropharynx is clear.  Eyes:     Pupils: Pupils are equal, round, and reactive to light.  Cardiovascular:     Rate and Rhythm: Normal rate and regular rhythm.  Pulmonary:     Effort: Pulmonary effort is normal.     Breath sounds: Normal breath sounds.  Abdominal:     General: Abdomen is flat.     Palpations: Abdomen is soft.  Musculoskeletal:        General: Normal range of motion.  Skin:    General: Skin is warm and dry.  Neurological:     General: No focal deficit present.     Mental Status: She is alert. Mental status is at baseline.  Psychiatric:        Attention and Perception: She is inattentive.        Mood and Affect: Mood normal. Affect is blunt.        Speech: She is noncommunicative.    Review of Systems  Constitutional: Negative.   HENT: Negative.    Eyes: Negative.   Respiratory: Negative.     Cardiovascular: Negative.   Gastrointestinal: Negative.   Musculoskeletal: Negative.   Skin: Negative.   Neurological: Negative.   Psychiatric/Behavioral: Negative.     Blood pressure 112/67, pulse (!) 56, temperature 97.9 F (36.6 C), temperature source Oral, resp. rate 16, height 5\' 4"  (1.626 m), weight 40.4 kg, SpO2 100 %. Body mass index is 15.28 kg/m.   Treatment Plan Summary: Medication management and Plan continue current medicine.  We have been trying to get the family to take her back so far without any success.  Ongoing discharge planning.  , MD 07/07/2022, 12:59 PM

## 2022-07-07 NOTE — Group Note (Signed)
[facility] LCSW Group Therapy Note   Group Date: 07/07/2022 Start Time: 1300 End Time: 1400  Type of Therapy/Topic:  Group Therapy:  Feelings about Diagnosis  Participation Level:  Did Not Attend    Description of Group:    This group will allow patients to explore their thoughts and feelings about diagnoses they have received. Patients will be guided to explore their level of understanding and acceptance of these diagnoses. Facilitator will encourage patients to process their thoughts and feelings about the reactions of others to their diagnosis, and will guide patients in identifying ways to discuss their diagnosis with significant others in their lives. This group will be process-oriented, with patients participating in exploration of their own experiences as well as giving and receiving support and challenge from other group members.   Therapeutic Goals: 1. Patient will demonstrate understanding of diagnosis as evidence by identifying two or more symptoms of the disorder:  2. Patient will be able to express two feelings regarding the diagnosis 3. Patient will demonstrate ability to communicate their needs through discussion and/or role plays  Summary of Patient Progress: X   Therapeutic Modalities:   Cognitive Behavioral Therapy Brief Therapy Feelings Identification    Cavin Longman R Ayham Word, LCSW 

## 2022-07-07 NOTE — Plan of Care (Signed)
D- Patient alert and oriented. Patient presented in a  pleasant mood on assessment reporting that she slept good last night and had no complaints to voice to this Clinical research associate. Patient denied SI, HI, AVH, and pain at this time. Patient also denied any signs/symptoms of depression and anxiety, patient voices that she is just ready to go. Patient's stated goal for today, per her self-inventory, is discharge.   A- Scheduled medications administered to patient, per MD orders. Support and encouragement provided.  Routine safety checks conducted every 15 minutes.  Patient informed to notify staff with problems or concerns.  R- No adverse drug reactions noted. Patient contracts for safety at this time. Patient compliant with medications and treatment plan. Patient receptive, calm, and cooperative. Patient remains safe at this time.  Problem: Education: Goal: Knowledge of General Education information will improve Description: Including pain rating scale, medication(s)/side effects and non-pharmacologic comfort measures Outcome: Progressing   Problem: Health Behavior/Discharge Planning: Goal: Ability to manage health-related needs will improve Outcome: Progressing   Problem: Clinical Measurements: Goal: Ability to maintain clinical measurements within normal limits will improve Outcome: Progressing Goal: Will remain free from infection Outcome: Progressing Goal: Diagnostic test results will improve Outcome: Progressing Goal: Respiratory complications will improve Outcome: Progressing Goal: Cardiovascular complication will be avoided Outcome: Progressing   Problem: Activity: Goal: Risk for activity intolerance will decrease Outcome: Progressing   Problem: Nutrition: Goal: Adequate nutrition will be maintained Outcome: Progressing   Problem: Coping: Goal: Level of anxiety will decrease Outcome: Progressing   Problem: Elimination: Goal: Will not experience complications related to bowel  motility Outcome: Progressing Goal: Will not experience complications related to urinary retention Outcome: Progressing   Problem: Pain Managment: Goal: General experience of comfort will improve Outcome: Progressing   Problem: Safety: Goal: Ability to remain free from injury will improve Outcome: Progressing   Problem: Skin Integrity: Goal: Risk for impaired skin integrity will decrease Outcome: Progressing   Problem: Activity: Goal: Will verbalize the importance of balancing activity with adequate rest periods Outcome: Progressing   Problem: Education: Goal: Will be free of psychotic symptoms Outcome: Progressing Goal: Knowledge of the prescribed therapeutic regimen will improve Outcome: Progressing   Problem: Coping: Goal: Coping ability will improve Outcome: Progressing Goal: Will verbalize feelings Outcome: Progressing   Problem: Health Behavior/Discharge Planning: Goal: Compliance with prescribed medication regimen will improve Outcome: Progressing   Problem: Nutritional: Goal: Ability to achieve adequate nutritional intake will improve Outcome: Progressing   Problem: Role Relationship: Goal: Ability to communicate needs accurately will improve Outcome: Progressing Goal: Ability to interact with others will improve Outcome: Progressing   Problem: Safety: Goal: Ability to redirect hostility and anger into socially appropriate behaviors will improve Outcome: Progressing Goal: Ability to remain free from injury will improve Outcome: Progressing   Problem: Self-Care: Goal: Ability to participate in self-care as condition permits will improve Outcome: Progressing   Problem: Self-Concept: Goal: Will verbalize positive feelings about self Outcome: Progressing   Problem: Education: Goal: Knowledge of [facility] Education information/materials will improve Outcome: Progressing Goal: Emotional status will improve Outcome: Progressing Goal: Mental  status will improve Outcome: Progressing Goal: Verbalization of understanding the information provided will improve Outcome: Progressing   Problem: Activity: Goal: Interest or engagement in activities will improve Outcome: Progressing Goal: Sleeping patterns will improve Outcome: Progressing   Problem: Coping: Goal: Ability to verbalize frustrations and anger appropriately will improve Outcome: Progressing Goal: Ability to demonstrate self-control will improve Outcome: Progressing   Problem: Health Behavior/Discharge Planning: Goal:  Identification of resources available to assist in meeting health care needs will improve Outcome: Progressing Goal: Compliance with treatment plan for underlying cause of condition will improve Outcome: Progressing   Problem: Physical Regulation: Goal: Ability to maintain clinical measurements within normal limits will improve Outcome: Progressing   Problem: Safety: Goal: Periods of time without injury will increase Outcome: Progressing

## 2022-07-08 DIAGNOSIS — F251 Schizoaffective disorder, depressive type: Secondary | ICD-10-CM | POA: Diagnosis not present

## 2022-07-08 NOTE — Group Note (Signed)
[facility] LCSW Group Therapy Note   Group Date: 07/08/2022 Start Time: 1300 End Time: 1400   Type of Therapy/Topic:  Group Therapy:  Emotion Regulation  Participation Level:  Did Not Attend   Mood:  Description of Group:    The purpose of this group is to assist patients in learning to regulate negative emotions and experience positive emotions. Patients will be guided to discuss ways in which they have been vulnerable to their negative emotions. These vulnerabilities will be juxtaposed with experiences of positive emotions or situations, and patients challenged to use positive emotions to combat negative ones. Special emphasis will be placed on coping with negative emotions in conflict situations, and patients will process healthy conflict resolution skills.  Therapeutic Goals: Patient will identify two positive emotions or experiences to reflect on in order to balance out negative emotions:  Patient will label two or more emotions that they find the most difficult to experience:  Patient will be able to demonstrate positive conflict resolution skills through discussion or role plays:   Summary of Patient Progress:   Patient did not attend group despite encouraged participation.      Therapeutic Modalities:   Cognitive Behavioral Therapy Feelings Identification Dialectical Behavioral Therapy   Antjuan Rothe W Reneisha Stilley, LCSWA 

## 2022-07-08 NOTE — Progress Notes (Signed)
Recreation Therapy Notes  Date: 07/08/2022   Time: 10:20 am     Location: Craft room      Behavioral response: [name]   Intervention Topic: Wellness    Discussion/Intervention: Patient refused to attend group.    Clinical Observations/Feedback:  Patient refused to attend group.    Jocie Meroney LRT/CTRS          Laiah Pouncey 07/08/2022 11:06 AM

## 2022-07-08 NOTE — Plan of Care (Signed)
Pt denies anxiety/depression at this time. Pt denies SI/HI/AVH or pain at this time. Pt is calm and cooperative. Pt is medication compliant. Pt provided with support and encouragement. Pt monitored q15 minutes for safety per unit policy. Plan of care ongoing.   Problem: Nutrition: Goal: Adequate nutrition will be maintained Outcome: Progressing   Problem: Coping: Goal: Level of anxiety will decrease Outcome: Progressing   

## 2022-07-08 NOTE — Progress Notes (Signed)
Pt denies SI/HI/AVH and verbally agrees to approach staff if these become apparent or before harming themselves/others. Rates depression 0/10. Rates anxiety 0/10. Rates pain 0/10.   Scheduled medications administered to pt, per MD orders. RN provided support and encouragement to pt. Q15 min safety checks implemented and continued. Pt safe on the unit. RN will continue to monitor and intervene as needed.  07/08/22 0813  Psych Admission Type (Psych Patients Only)  Admission Status Involuntary  Psychosocial Assessment  Patient Complaints None  Eye Contact Fair  Facial Expression Flat  Affect Appropriate to circumstance  Speech Logical/coherent  Interaction Isolative;Minimal  Motor Activity Slow  Appearance/Hygiene Unremarkable;In scrubs  Behavior Characteristics Cooperative;Appropriate to situation;Calm  Mood Pleasant  Aggressive Behavior  Effect No apparent injury  Thought Process  Coherency WDL  Content WDL  Delusions None reported or observed  Perception WDL  Hallucination None reported or observed  Judgment WDL  Confusion None  Danger to Self  Current suicidal ideation? Denies  Danger to Others  Danger to Others None reported or observed

## 2022-07-08 NOTE — Progress Notes (Signed)
Patient calm and pleasant during assessment denying SI/HI/AVH. Pt observed interacting appropriately with staff and peers on the unit. Pt compliant with medication administration per MD orders. Pt given education, support, and encouragement to be active in her treatment plan. Pt being monitored Q 15 minutes for safety per unit protocol, remains safe on the unit   

## 2022-07-08 NOTE — Progress Notes (Signed)
[facility] MD Progress Note  07/08/2022 2:43 PM [name]  MRN:  [id number] Subjective: No new complaint.  Continues with the same presentation as ever Principal Problem: Schizoaffective disorder, depressive type (HCC) Diagnosis: Principal Problem:   Schizoaffective disorder, depressive type (HCC)  Total Time spent with patient: 30 minutes  Past Psychiatric History: Past history of schizoaffective disorder  Past Medical History:  Past Medical History:  Diagnosis Date   Anemia 10/02/2021   Non compliance w medication regimen    Schizophrenia (HCC)     Past Surgical History:  Procedure Laterality Date   BIOPSY  09/25/2021   Procedure: BIOPSY;  Surgeon: Rachael Fee, MD;  Location: [facility];  Service: Gastroenterology;;   ESOPHAGOGASTRODUODENOSCOPY (EGD) WITH PROPOFOL [name] 09/25/2021   Procedure: ESOPHAGOGASTRODUODENOSCOPY (EGD) WITH PROPOFOL;  Surgeon: Rachael Fee, MD;  Location: [facility];  Service: Gastroenterology;  Laterality: [name];  With NGT placement   Family History: History reviewed. No pertinent family history. Family Psychiatric  History: None Social History:  Social History   Substance and Sexual Activity  Alcohol Use Not Currently   Comment: Refuses to disclose how much     Social History   Substance and Sexual Activity  Drug Use No   Comment: Refuses to answer    Social History   Socioeconomic History   Marital status: Single    Spouse name: Not on file   Number of children: Not on file   Years of education: Not on file   Highest education level: Not on file  Occupational History   Not on file  Tobacco Use   Smoking status: Every Day    Packs/day: 2.00    Types: Cigarettes   Smokeless tobacco: Never  Substance and Sexual Activity   Alcohol use: Not Currently    Comment: Refuses to disclose how much   Drug use: No    Comment: Refuses to answer   Sexual activity: Not Currently    Comment: refused to answer  Other Topics Concern   Not on  file  Social History Narrative   Not on file   Social Determinants of Health   Financial Resource Strain: Not on file  Food Insecurity: No Food Insecurity (04/08/2022)   Hunger Vital Sign    Worried About Running Out of Food in the Last Year: Never true    Ran Out of Food in the Last Year: Never true  Transportation Needs: No Transportation Needs (04/08/2022)   PRAPARE - Administrator, Civil Service (Medical): No    Lack of Transportation (Non-Medical): No  Physical Activity: Not on file  Stress: Not on file  Social Connections: Not on file   Additional Social History:                         Sleep: Fair  Appetite:  Fair  Current Medications: Current Facility-Administered Medications  Medication Dose Route Frequency Provider Last Rate Last Admin   acetaminophen (TYLENOL) tablet 650 mg  650 mg Oral Q6H PRN Dandre Sisler T, MD       alum & mag hydroxide-simeth (MAALOX/MYLANTA) 200-200-20 MG/5ML suspension 30 mL  30 mL Oral Q4H PRN Marky Buresh T, MD       haloperidol (HALDOL) tablet 5 mg  5 mg Oral BID Holland Kotter, [name], MD   5 mg at 07/08/22 2703   Or   haloperidol lactate (HALDOL) injection 5 mg  5 mg Intramuscular BID Kila Godina, [name], MD  lithium carbonate capsule 600 mg  600 mg Oral QHS Markeshia Giebel T, MD   600 mg at 07/07/22 2111   LORazepam (ATIVAN) tablet 1 mg  1 mg Oral BID Annalynn Centanni T, MD   1 mg at 07/08/22 5176   Or   LORazepam (ATIVAN) injection 1 mg  1 mg Intramuscular BID Jansen Goodpasture T, MD       magnesium hydroxide (MILK OF MAGNESIA) suspension 30 mL  30 mL Oral Daily PRN Teonia Yager T, MD       mirtazapine (REMERON) tablet 30 mg  30 mg Oral QHS Elyse Prevo T, MD   30 mg at 07/07/22 2111   multivitamin with minerals tablet 1 tablet  1 tablet Oral Daily Kalix Meinecke, [name], MD   1 tablet at 07/08/22 0813   QUEtiapine (SEROQUEL) tablet 400 mg  400 mg Oral QHS Mysti Haley T, MD   400 mg at 07/07/22 2111   thiamine (VITAMIN B1) tablet  100 mg  100 mg Oral Daily Donnette Macmullen, [name], MD   100 mg at 07/08/22 1607    Lab Results: No results found for this or any previous visit (from the past 48 hour(s)).  Blood Alcohol level:  Lab Results  Component Value Date   ETH <10 03/29/2022   ETH <10 09/12/2021    Metabolic Disorder Labs: Lab Results  Component Value Date   HGBA1C 5.6 10/21/2021   MPG 114 10/21/2021   MPG 111 10/02/2021   Lab Results  Component Value Date   PROLACTIN 13.6 04/16/2015   Lab Results  Component Value Date   CHOL 189 10/21/2021   TRIG 52 10/21/2021   HDL 50 10/21/2021   CHOLHDL 3.8 10/21/2021   VLDL 10 10/21/2021   LDLCALC 129 (H) 10/21/2021   LDLCALC 93 10/01/2020    Physical Findings: AIMS: Facial and Oral Movements Muscles of Facial Expression: None, normal Lips and Perioral Area: None, normal Jaw: None, normal Tongue: None, normal,Extremity Movements Upper (arms, wrists, hands, fingers): None, normal Lower (legs, knees, ankles, toes): None, normal, Trunk Movements Neck, shoulders, hips: None, normal, Overall Severity Severity of abnormal movements (highest score from questions above): None, normal Incapacitation due to abnormal movements: None, normal Patient's awareness of abnormal movements (rate only patient's report): No Awareness, Dental Status Current problems with teeth and/or dentures?: No Does patient usually wear dentures?: No  CIWA:    COWS:     Musculoskeletal: Strength & Muscle Tone: within normal limits Gait & Station: normal Patient leans: [name]  Psychiatric Specialty Exam:  Presentation  General Appearance:  Disheveled  Eye Contact: Fair  Speech: Pressured  Speech Volume: Increased  Handedness: Right   Mood and Affect  Mood: Irritable; Labile; Angry  Affect: Blunt; Labile   Thought Process  Thought Processes: Disorganized; Irrevelant  Descriptions of Associations:Loose  Orientation:Partial  Thought Content:Illogical; Rumination;  Scattered; Tangential; Delusions  History of Schizophrenia/Schizoaffective disorder:Yes  Duration of Psychotic Symptoms:Greater than six months  Hallucinations:No data recorded Ideas of Reference:Percusatory; Paranoia  Suicidal Thoughts:No data recorded Homicidal Thoughts:No data recorded  Sensorium  Memory: Immediate Fair; Recent Fair; Remote Fair  Judgment: Impaired  Insight: Poor; Lacking   Executive Functions  Concentration: Poor  Attention Span: Poor  Recall: Poor  Fund of Knowledge: Poor  Language: Poor   Psychomotor Activity  Psychomotor Activity:No data recorded  Assets  Assets: Physical Health; Resilience; Housing; Social Support   Sleep  Sleep:No data recorded   Physical Exam: Physical Exam Vitals and nursing note reviewed.  Constitutional:      Appearance: Normal appearance.  HENT:     Head: Normocephalic and atraumatic.     Mouth/Throat:     Pharynx: Oropharynx is clear.  Eyes:     Pupils: Pupils are equal, round, and reactive to light.  Cardiovascular:     Rate and Rhythm: Normal rate and regular rhythm.  Pulmonary:     Effort: Pulmonary effort is normal.     Breath sounds: Normal breath sounds.  Abdominal:     General: Abdomen is flat.     Palpations: Abdomen is soft.  Musculoskeletal:        General: Normal range of motion.  Skin:    General: Skin is warm and dry.  Neurological:     General: No focal deficit present.     Mental Status: She is alert. Mental status is at baseline.  Psychiatric:        Attention and Perception: She is inattentive.        Mood and Affect: Mood normal. Affect is blunt.        Speech: Speech is delayed.        Behavior: Behavior is slowed.        Thought Content: Thought content normal.    Review of Systems  Constitutional: Negative.   HENT: Negative.    Eyes: Negative.   Respiratory: Negative.    Cardiovascular: Negative.   Gastrointestinal: Negative.   Musculoskeletal: Negative.    Skin: Negative.   Neurological: Negative.   Psychiatric/Behavioral: Negative.     Blood pressure 113/68, pulse (!) 58, temperature 97.6 F (36.4 C), temperature source Oral, resp. rate 16, height 5\' 4"  (1.626 m), weight 40.4 kg, SpO2 100 %. Body mass index is 15.28 kg/m.   Treatment Plan Summary: Plan no change to overall treatment plan.  Seeking placement.  , MD 07/08/2022, 2:43 PM

## 2022-07-08 NOTE — Plan of Care (Signed)
  Problem: Education: Goal: Knowledge of General Education information will improve Description: Including pain rating scale, medication(s)/side effects and non-pharmacologic comfort measures Outcome: Progressing   Problem: Coping: Goal: Level of anxiety will decrease Outcome: Progressing   

## 2022-07-09 DIAGNOSIS — F251 Schizoaffective disorder, depressive type: Secondary | ICD-10-CM | POA: Diagnosis not present

## 2022-07-09 NOTE — BH IP Treatment Plan (Signed)
Interdisciplinary Treatment and Diagnostic Plan Update  07/09/2022 Time of Session: 08:30 [name] MRN: [id number]  Principal Diagnosis: Schizoaffective disorder, depressive type [facility])  Secondary Diagnoses: Principal Problem:   Schizoaffective disorder, depressive type (HCC)   Current Medications:  Current Facility-Administered Medications  Medication Dose Route Frequency Provider Last Rate Last Admin   acetaminophen (TYLENOL) tablet 650 mg  650 mg Oral Q6H PRN [name], [name], MD       alum & mag hydroxide-simeth (MAALOX/MYLANTA) 200-200-20 MG/5ML suspension 30 mL  30 mL Oral Q4H PRN [name], [name], MD       haloperidol (HALDOL) tablet 5 mg  5 mg Oral BID [name], [name], MD   5 mg at 07/09/22 3086   Or   haloperidol lactate (HALDOL) injection 5 mg  5 mg Intramuscular BID [name], [name], MD       lithium carbonate capsule 600 mg  600 mg Oral QHS [name], [name], MD   600 mg at 07/08/22 2114   LORazepam (ATIVAN) tablet 1 mg  1 mg Oral BID [name], [name], MD   1 mg at 07/09/22 5784   Or   LORazepam (ATIVAN) injection 1 mg  1 mg Intramuscular BID [name], [name], MD       magnesium hydroxide (MILK OF MAGNESIA) suspension 30 mL  30 mL Oral Daily PRN [name], [name], MD       mirtazapine (REMERON) tablet 30 mg  30 mg Oral QHS [name], [name], MD   30 mg at 07/08/22 2114   multivitamin with minerals tablet 1 tablet  1 tablet Oral Daily [name], [name], MD   1 tablet at 07/09/22 6962   QUEtiapine (SEROQUEL) tablet 400 mg  400 mg Oral QHS [name], [name], MD   400 mg at 07/08/22 2114   thiamine (VITAMIN B1) tablet 100 mg  100 mg Oral Daily [name], [name], MD   100 mg at 07/09/22 9528   PTA Medications: Medications Prior to Admission  Medication Sig Dispense Refill Last Dose   clonazePAM (KLONOPIN) 0.5 MG tablet Take 0.5 tablets (0.25 mg total) by mouth 2 (two) times daily. (Patient not taking: Reported on 03/30/2022) 60 tablet 0    haloperidol (HALDOL) 5 MG tablet Take 1 tablet (5 mg  total) by mouth 2 (two) times daily. (Patient not taking: Reported on 03/30/2022) 60 tablet 3    midodrine (PROAMATINE) 2.5 MG tablet Take 1 tablet (2.5 mg total) by mouth 3 (three) times daily with meals. (Patient not taking: Reported on 03/30/2022)  0    mirtazapine (REMERON) 15 MG tablet Take 0.5 tablets (7.5 mg total) by mouth at bedtime. (Patient not taking: Reported on 03/30/2022) 30 tablet 2    Multiple Vitamin (MULTIVITAMIN WITH MINERALS) TABS tablet Take 1 tablet by mouth daily. (Patient not taking: Reported on 03/30/2022)      OLANZapine (ZYPREXA) 5 MG tablet Take 1 tablet (5 mg total) by mouth at bedtime. (Patient not taking: Reported on 03/30/2022) 30 tablet 2    thiamine 100 MG tablet Take 1 tablet (100 mg total) by mouth daily. (Patient not taking: Reported on 03/30/2022)       Patient Stressors: Marital or family conflict    Patient Strengths: Supportive family/friends   Treatment Modalities: Medication Management, Group therapy, Case management,  1 to 1 session with clinician, Psychoeducation, Recreational therapy.   Physician Treatment Plan for Primary Diagnosis: Schizoaffective disorder, depressive type (HCC) Long Term Goal(s): Improvement in symptoms so as ready for  discharge   Short Term Goals: Compliance with prescribed medications will improve Ability to verbalize feelings will improve Ability to demonstrate self-control will improve Ability to identify and develop effective coping behaviors will improve  Medication Management: Evaluate patient's response, side effects, and tolerance of medication regimen.  Therapeutic Interventions: 1 to 1 sessions, Unit Group sessions and Medication administration.  Evaluation of Outcomes: Progressing  Physician Treatment Plan for Secondary Diagnosis: Principal Problem:   Schizoaffective disorder, depressive type (HCC)  Long Term Goal(s): Improvement in symptoms so as ready for discharge   Short Term Goals: Compliance with  prescribed medications will improve Ability to verbalize feelings will improve Ability to demonstrate self-control will improve Ability to identify and develop effective coping behaviors will improve     Medication Management: Evaluate patient's response, side effects, and tolerance of medication regimen.  Therapeutic Interventions: 1 to 1 sessions, Unit Group sessions and Medication administration.  Evaluation of Outcomes: Progressing   RN Treatment Plan for Primary Diagnosis: Schizoaffective disorder, depressive type (HCC) Long Term Goal(s): Knowledge of disease and therapeutic regimen to maintain health will improve  Short Term Goals: Ability to remain free from injury will improve, Ability to verbalize frustration and anger appropriately will improve, Ability to demonstrate self-control, Ability to participate in decision making will improve, Ability to verbalize feelings will improve, Ability to disclose and discuss suicidal ideas, and Compliance with prescribed medications will improve  Medication Management: RN will administer medications as ordered by provider, will assess and evaluate patient's response and provide education to patient for prescribed medication. RN will report any adverse and/or side effects to prescribing provider.  Therapeutic Interventions: 1 on 1 counseling sessions, Psychoeducation, Medication administration, Evaluate responses to treatment, Monitor vital signs and CBGs as ordered, Perform/monitor CIWA, COWS, AIMS and Fall Risk screenings as ordered, Perform wound care treatments as ordered.  Evaluation of Outcomes: Progressing   LCSW Treatment Plan for Primary Diagnosis: Schizoaffective disorder, depressive type (HCC) Long Term Goal(s): Safe transition to appropriate next level of care at discharge, Engage patient in therapeutic group addressing interpersonal concerns.  Short Term Goals: Engage patient in aftercare planning with referrals and resources,  Increase social support, Increase ability to appropriately verbalize feelings, Increase emotional regulation, Facilitate acceptance of mental health diagnosis and concerns, and Increase skills for wellness and recovery  Therapeutic Interventions: Assess for all discharge needs, 1 to 1 time with Social worker, Explore available resources and support systems, Assess for adequacy in community support network, Educate family and significant other(s) on suicide prevention, Complete Psychosocial Assessment, Interpersonal group therapy.  Evaluation of Outcomes: Progressing   Progress in Treatment: Attending groups: No. Participating in groups: No. Taking medication as prescribed: Yes. Toleration medication: Yes. Family/Significant other contact made: Yes, individual(s) contacted:  guardian/sister, [name] Miggins-Lennon. Patient understands diagnosis: Yes. Discussing patient identified problems/goals with staff: Yes. Medical problems stabilized or resolved: Yes. Denies suicidal/homicidal ideation: Yes. Issues/concerns per patient self-inventory: No. Other: none  New problem(s) identified: No, Describe:  none  Update 06/29/2022: No changes at this time. Update 07/04/2022: No changes at this time. Update 07/09/22: No changes at this time.    New Short Term/Long Term Goal(s): Patient to work towards elimination of symptoms of psychosis, medication management for mood stabilization; elimination of SI thoughts; development of comprehensive mental wellness plan.  Update 06/29/2022: No changes at this time. Update 07/04/2022: No changes at this time. Update 07/09/22: No changes at this time.   Patient Goals:  No additional goals identified at this time. Patient to continue  to work towards original goals identified in initial treatment team meeting. CSW will remain available to patient should they voice additional treatment goals. Update 06/29/2022: No changes at this time. Update 07/04/2022: No changes at  this time. Update 07/09/22: No changes at this time.   Discharge Plan or Barriers: Patient lacks adequate housing/supervision at discharge. Patient's guardian refuses to pick patient up from hospital out of safety concerns. CSW team continuing to work with legal guardian to attempt to resolve concerns and identify adequate discharge plan. Update 06/29/2022: Guardian is seeking special assistance medicaid.  She has not completed the paperwork at last update.  Patient remains on the unit and safe, however, no plans have been developed for placement at discharge.  Patient lacks funding for group home, and can not afford out of pocket costs associated with Assisted Living. Update 07/04/2022: Currently seeking ALF/Group Home placement. [facility] reviewing for potential placement. Update 07/09/22: No changes at this time.   Reason for Continuation of Hospitalization: Other; describe Patient is psychiatrically clear. Patient is currently boarding.    Estimated Length of Stay: TBD Update 06/29/2022: TBD Update 07/04/2022: No changes at this time. Update 07/09/22: No changes at this time.  Last 3 [place] Suicide Severity Risk Score: Flowsheet Row Admission (Current) from 04/08/2022 in [facility] Admission (Discharged) from 10/17/2021 in [facility] (Discharged) from 10/02/2021 in [facility]  C-SSRS RISK CATEGORY No Risk No Risk No Risk       Last PHQ 2/9 Scores:     No data to display          Scribe for Treatment Team: Shirl Harris, LCSW 07/09/2022 9:07 AM

## 2022-07-09 NOTE — Progress Notes (Signed)
Pt denies SI/HI/AVH and verbally agrees to approach staff if these become apparent or before harming themselves/others. Rates depression 0/10. Rates anxiety 0/10. Rates pain 0/10.   Scheduled medications administered to pt, per MD orders. RN provided support and encouragement to pt. Q15 min safety checks implemented and continued. Pt safe on the unit. RN will continue to monitor and intervene as needed.  07/09/22 0811  Psych Admission Type (Psych Patients Only)  Admission Status Involuntary  Psychosocial Assessment  Patient Complaints None  Eye Contact Fair  Facial Expression Flat  Affect Flat  Speech Logical/coherent;Soft  Interaction Minimal;Isolative  Motor Activity Slow  Appearance/Hygiene Unremarkable;In scrubs  Behavior Characteristics Cooperative;Appropriate to situation;Calm  Mood Pleasant  Aggressive Behavior  Effect No apparent injury  Thought Process  Coherency WDL  Content WDL  Delusions None reported or observed  Perception WDL  Hallucination None reported or observed  Judgment WDL  Confusion None  Danger to Self  Current suicidal ideation? Denies  Danger to Others  Danger to Others None reported or observed

## 2022-07-09 NOTE — Group Note (Signed)

## 2022-07-09 NOTE — Progress Notes (Signed)
Recreation Therapy Notes   Date: 07/09/2022  Time: 9:15 am    Location: Craft room     Behavioral response: [name]   Intervention Topic: Time Management   Discussion/Intervention: Patient refused to attend group.   Clinical Observations/Feedback:  Patient refused to attend group.    Criston Chancellor LRT/CTRS         Carmine Youngberg 07/09/2022 12:06 PM 

## 2022-07-09 NOTE — Plan of Care (Signed)
  Problem: Coping: Goal: Level of anxiety will decrease Outcome: Progressing   Problem: Elimination: Goal: Will not experience complications related to bowel motility Outcome: Progressing   Problem: Coping: Goal: Coping ability will improve Outcome: Progressing

## 2022-07-09 NOTE — Progress Notes (Signed)
[facility] MD Progress Note  07/09/2022 11:20 AM [name]  MRN:  [id number] Subjective: Follow-up today for this 61 year old woman with chronic psychosis.  Patient's presentation is unchanged.  Comes out to eat but spends most of her time by herself in her room.  When I tried to engage her in conversation becomes agitated and starts talking about state troopers and spaceships and that sort of thing.  Unwilling to engage in more lucid conversation.  I attempted today to call her guardian who is her sister.  I called the phone number available and was put to voicemail but the mailbox was full so I could not leave a message. Principal Problem: Schizoaffective disorder, depressive type (HCC) Diagnosis: Principal Problem:   Schizoaffective disorder, depressive type (HCC)  Total Time spent with patient: 30 minutes  Past Psychiatric History: Long history of chronic psychosis  Past Medical History:  Past Medical History:  Diagnosis Date   Anemia 10/02/2021   Non compliance w medication regimen    Schizophrenia [facility])     Past Surgical History:  Procedure Laterality Date   BIOPSY  09/25/2021   Procedure: BIOPSY;  Surgeon: Rachael Fee, MD;  Location: [facility];  Service: Gastroenterology;;   ESOPHAGOGASTRODUODENOSCOPY (EGD) WITH PROPOFOL [name] 09/25/2021   Procedure: ESOPHAGOGASTRODUODENOSCOPY (EGD) WITH PROPOFOL;  Surgeon: Rachael Fee, MD;  Location: [facility];  Service: Gastroenterology;  Laterality: [name];  With NGT placement   Family History: History reviewed. No pertinent family history. Family Psychiatric  History: See previous Social History:  Social History   Substance and Sexual Activity  Alcohol Use Not Currently   Comment: Refuses to disclose how much     Social History   Substance and Sexual Activity  Drug Use No   Comment: Refuses to answer    Social History   Socioeconomic History   Marital status: Single    Spouse name: Not on file   Number of children: Not on  file   Years of education: Not on file   Highest education level: Not on file  Occupational History   Not on file  Tobacco Use   Smoking status: Every Day    Packs/day: 2.00    Types: Cigarettes   Smokeless tobacco: Never  Substance and Sexual Activity   Alcohol use: Not Currently    Comment: Refuses to disclose how much   Drug use: No    Comment: Refuses to answer   Sexual activity: Not Currently    Comment: refused to answer  Other Topics Concern   Not on file  Social History Narrative   Not on file   Social Determinants of Health   Financial Resource Strain: Not on file  Food Insecurity: No Food Insecurity (04/08/2022)   Hunger Vital Sign    Worried About Running Out of Food in the Last Year: Never true    Ran Out of Food in the Last Year: Never true  Transportation Needs: No Transportation Needs (04/08/2022)   PRAPARE - Administrator, Civil Service (Medical): No    Lack of Transportation (Non-Medical): No  Physical Activity: Not on file  Stress: Not on file  Social Connections: Not on file   Additional Social History:                         Sleep: Fair  Appetite:  Fair  Current Medications: Current Facility-Administered Medications  Medication Dose Route Frequency Provider Last Rate Last Admin   acetaminophen (TYLENOL)  tablet 650 mg  650 mg Oral Q6H PRN Olu[place] Kinyon T, MD       alum & mag hydroxide-simeth (MAALOX/MYLANTA) 200-200-20 MG/5ML suspension 30 mL  30 mL Oral Q4H PRN Hershy Flenner T, MD       haloperidol (HALDOL) tablet 5 mg  5 mg Oral BID Jeptha Hinnenkamp T, MD   5 mg at 07/09/22 1224   Or   haloperidol lactate (HALDOL) injection 5 mg  5 mg Intramuscular BID Darcey Cardy T, MD       lithium carbonate capsule 600 mg  600 mg Oral QHS Alvie Fowles T, MD   600 mg at 07/08/22 2114   LORazepam (ATIVAN) tablet 1 mg  1 mg Oral BID Evie Crumpler T, MD   1 mg at 07/09/22 8250   Or   LORazepam (ATIVAN) injection 1 mg  1 mg Intramuscular  BID Edras Wilford T, MD       magnesium hydroxide (MILK OF MAGNESIA) suspension 30 mL  30 mL Oral Daily PRN Aarav Burgett T, MD       mirtazapine (REMERON) tablet 30 mg  30 mg Oral QHS Aleiah Mohammed T, MD   30 mg at 07/08/22 2114   multivitamin with minerals tablet 1 tablet  1 tablet Oral Daily Mylinda Brook, [name], MD   1 tablet at 07/09/22 0370   QUEtiapine (SEROQUEL) tablet 400 mg  400 mg Oral QHS Lenita Peregrina T, MD   400 mg at 07/08/22 2114   thiamine (VITAMIN B1) tablet 100 mg  100 mg Oral Daily Rayhana Slider, [name], MD   100 mg at 07/09/22 4888    Lab Results: No results found for this or any previous visit (from the past 48 hour(s)).  Blood Alcohol level:  Lab Results  Component Value Date   ETH <10 03/29/2022   ETH <10 09/12/2021    Metabolic Disorder Labs: Lab Results  Component Value Date   HGBA1C 5.6 10/21/2021   MPG 114 10/21/2021   MPG 111 10/02/2021   Lab Results  Component Value Date   PROLACTIN 13.6 04/16/2015   Lab Results  Component Value Date   CHOL 189 10/21/2021   TRIG 52 10/21/2021   HDL 50 10/21/2021   CHOLHDL 3.8 10/21/2021   VLDL 10 10/21/2021   LDLCALC 129 (H) 10/21/2021   LDLCALC 93 10/01/2020    Physical Findings: AIMS: Facial and Oral Movements Muscles of Facial Expression: None, normal Lips and Perioral Area: None, normal Jaw: None, normal Tongue: None, normal,Extremity Movements Upper (arms, wrists, hands, fingers): None, normal Lower (legs, knees, ankles, toes): None, normal, Trunk Movements Neck, shoulders, hips: None, normal, Overall Severity Severity of abnormal movements (highest score from questions above): None, normal Incapacitation due to abnormal movements: None, normal Patient's awareness of abnormal movements (rate only patient's report): No Awareness, Dental Status Current problems with teeth and/or dentures?: No Does patient usually wear dentures?: No  CIWA:    COWS:     Musculoskeletal: Strength & Muscle Tone: within normal  limits Gait & Station: normal Patient leans: [name]  Psychiatric Specialty Exam:  Presentation  General Appearance:  Disheveled  Eye Contact: Fair  Speech: Pressured  Speech Volume: Increased  Handedness: Right   Mood and Affect  Mood: Irritable; Labile; Angry  Affect: Blunt; Labile   Thought Process  Thought Processes: Disorganized; Irrevelant  Descriptions of Associations:Loose  Orientation:Partial  Thought Content:Illogical; Rumination; Scattered; Tangential; Delusions  History of Schizophrenia/Schizoaffective disorder:Yes  Duration of Psychotic Symptoms:Greater than six months  Hallucinations:No  data recorded Ideas of Reference:Percusatory; Paranoia  Suicidal Thoughts:No data recorded Homicidal Thoughts:No data recorded  Sensorium  Memory: Immediate Fair; Recent Fair; Remote Fair  Judgment: Impaired  Insight: Poor; Lacking   Executive Functions  Concentration: Poor  Attention Span: Poor  Recall: Poor  Fund of Knowledge: Poor  Language: Poor   Psychomotor Activity  Psychomotor Activity:No data recorded  Assets  Assets: Physical Health; Resilience; Housing; Social Support   Sleep  Sleep:No data recorded   Physical Exam: Physical Exam Vitals reviewed.  Constitutional:      Appearance: Normal appearance.  HENT:     Head: Normocephalic and atraumatic.     Mouth/Throat:     Pharynx: Oropharynx is clear.  Eyes:     Pupils: Pupils are equal, round, and reactive to light.  Cardiovascular:     Rate and Rhythm: Normal rate and regular rhythm.  Pulmonary:     Effort: Pulmonary effort is normal.     Breath sounds: Normal breath sounds.  Abdominal:     General: Abdomen is flat.     Palpations: Abdomen is soft.  Musculoskeletal:        General: Normal range of motion.  Skin:    General: Skin is warm and dry.  Neurological:     General: No focal deficit present.     Mental Status: She is alert. Mental status is at  baseline.  Psychiatric:        Attention and Perception: She is inattentive.        Mood and Affect: Mood normal. Affect is blunt.        Speech: Speech is delayed.        Behavior: Behavior is slowed.        Thought Content: Thought content normal.        Cognition and Memory: Cognition is impaired.    Review of Systems  Constitutional: Negative.   HENT: Negative.    Eyes: Negative.   Respiratory: Negative.    Cardiovascular: Negative.   Gastrointestinal: Negative.   Musculoskeletal: Negative.   Skin: Negative.   Neurological: Negative.   Psychiatric/Behavioral: Negative.     Blood pressure 115/76, pulse (!) 51, temperature 97.6 F (36.4 C), temperature source Oral, resp. rate 16, height 5\' 4"  (1.626 m), weight 40.4 kg, SpO2 100 %. Body mass index is 15.28 kg/m.   Treatment Plan Summary: Medication management and Plan I had been hoping we could try once again to negotiate with family to take her home but was not able to reach them or leave a message.  Supportive and encouraging counseling to the patient.  No change to medicine.  Mostly looking for discharge planning.  , MD 07/09/2022, 11:20 AM

## 2022-07-09 NOTE — Plan of Care (Signed)
Pt denies anxiety/depression at this time. Pt denies SI/HI/AVH or pain at this time. Pt is calm and cooperative. Pt is medication compliant. Pt provided with support and encouragement. Pt monitored q15 minutes for safety per unit policy. Plan of care ongoing.   Problem: Coping: Goal: Level of anxiety will decrease Outcome: Progressing   Problem: Education: Goal: Emotional status will improve Outcome: Progressing

## 2022-07-10 DIAGNOSIS — F251 Schizoaffective disorder, depressive type: Secondary | ICD-10-CM | POA: Diagnosis not present

## 2022-07-10 NOTE — Plan of Care (Signed)
D: Patient alert and oriented. Patient denies pain. Patient denies anxiety and depression. Patient denies SI/HI/AVH. Patient remains isolative to room with exception to coming out for meals and medication.  A: Scheduled medications administered to patient, per MD orders.  Support and encouragement provided to patient.  Q15 minute safety checks maintained.   R: Patient compliant with medication administration and treatment plan. No adverse drug reactions noted. Patient remains safe on the unit at this time.  Problem: Education: Goal: Knowledge of [facility] Education information/materials will improve Outcome: Progressing Goal: Verbalization of understanding the information provided will improve Outcome: Progressing   Problem: Health Behavior/Discharge Planning: Goal: Compliance with treatment plan for underlying cause of condition will improve Outcome: Progressing   Problem: Physical Regulation: Goal: Ability to maintain clinical measurements within normal limits will improve Outcome: Progressing   Problem: Safety: Goal: Periods of time without injury will increase Outcome: Progressing   

## 2022-07-10 NOTE — Plan of Care (Signed)
Pt denies anxiety/depression at this time. Pt denies SI/HI/AVH or pain at this time. Pt is calm and cooperative. Pt is medication compliant. Pt provided with support and encouragement. Pt monitored q15 minutes for safety per unit policy. Plan of care ongoing.   Problem: Education: Goal: Knowledge of General Education information will improve Description: Including pain rating scale, medication(s)/side effects and non-pharmacologic comfort measures Outcome: Progressing   Problem: Health Behavior/Discharge Planning: Goal: Ability to manage health-related needs will improve Outcome: Progressing

## 2022-07-10 NOTE — Progress Notes (Signed)
Recreation Therapy Notes    Date: 07/10/2022  Time: 10:05 am  Location: Craft room     Behavioral response: [name]   Intervention Topic: Self-care   Discussion/Intervention: Patient refused to attend group.   Clinical Observations/Feedback:  Patient refused to attend group.    Jaeven Wanzer LRT/CTRS        Channing Savich 07/10/2022 12:25 PM

## 2022-07-10 NOTE — Progress Notes (Signed)
[facility] MD Progress Note  07/10/2022 3:18 PM [name]  MRN:  [id number] Subjective: Follow-up patient with schizoaffective disorder.  No new complaints.  Eats and comes out a little bit and takes care of her basic needs but otherwise not very interactive.  Still paranoid and unable to engage in conversation.  Spoke with her sisters at some length yesterday.  Family is working on a plan to get Medicaid for her. Principal Problem: Schizoaffective disorder, depressive type (HCC) Diagnosis: Principal Problem:   Schizoaffective disorder, depressive type (HCC)  Total Time spent with patient: 30 minutes  Past Psychiatric History: Past history of schizoaffective disorder  Past Medical History:  Past Medical History:  Diagnosis Date   Anemia 10/02/2021   Non compliance w medication regimen    Schizophrenia [facility])     Past Surgical History:  Procedure Laterality Date   BIOPSY  09/25/2021   Procedure: BIOPSY;  Surgeon: Rachael Fee, MD;  Location: [facility];  Service: Gastroenterology;;   ESOPHAGOGASTRODUODENOSCOPY (EGD) WITH PROPOFOL [name] 09/25/2021   Procedure: ESOPHAGOGASTRODUODENOSCOPY (EGD) WITH PROPOFOL;  Surgeon: Rachael Fee, MD;  Location: [facility];  Service: Gastroenterology;  Laterality: [name];  With NGT placement   Family History: History reviewed. No pertinent family history. Family Psychiatric  History: See previous Social History:  Social History   Substance and Sexual Activity  Alcohol Use Not Currently   Comment: Refuses to disclose how much     Social History   Substance and Sexual Activity  Drug Use No   Comment: Refuses to answer    Social History   Socioeconomic History   Marital status: Single    Spouse name: Not on file   Number of children: Not on file   Years of education: Not on file   Highest education level: Not on file  Occupational History   Not on file  Tobacco Use   Smoking status: Every Day    Packs/day: 2.00    Types: Cigarettes    Smokeless tobacco: Never  Substance and Sexual Activity   Alcohol use: Not Currently    Comment: Refuses to disclose how much   Drug use: No    Comment: Refuses to answer   Sexual activity: Not Currently    Comment: refused to answer  Other Topics Concern   Not on file  Social History Narrative   Not on file   Social Determinants of Health   Financial Resource Strain: Not on file  Food Insecurity: No Food Insecurity (04/08/2022)   Hunger Vital Sign    Worried About Running Out of Food in the Last Year: Never true    Ran Out of Food in the Last Year: Never true  Transportation Needs: No Transportation Needs (04/08/2022)   PRAPARE - Administrator, Civil Service (Medical): No    Lack of Transportation (Non-Medical): No  Physical Activity: Not on file  Stress: Not on file  Social Connections: Not on file   Additional Social History:                         Sleep: Fair  Appetite:  Fair  Current Medications: Current Facility-Administered Medications  Medication Dose Route Frequency Provider Last Rate Last Admin   acetaminophen (TYLENOL) tablet 650 mg  650 mg Oral Q6H PRN Shaundra Fullam T, MD       alum & mag hydroxide-simeth (MAALOX/MYLANTA) 200-200-20 MG/5ML suspension 30 mL  30 mL Oral Q4H PRN Analya Louissaint, [name], MD  haloperidol (HALDOL) tablet 5 mg  5 mg Oral BID Courtnee Myer T, MD   5 mg at 07/10/22 6433   Or   haloperidol lactate (HALDOL) injection 5 mg  5 mg Intramuscular BID Alek Poncedeleon T, MD       lithium carbonate capsule 600 mg  600 mg Oral QHS Daliah Chaudoin T, MD   600 mg at 07/09/22 2123   LORazepam (ATIVAN) tablet 1 mg  1 mg Oral BID Honestii Marton T, MD   1 mg at 07/10/22 2951   Or   LORazepam (ATIVAN) injection 1 mg  1 mg Intramuscular BID Adreanna Fickel T, MD       magnesium hydroxide (MILK OF MAGNESIA) suspension 30 mL  30 mL Oral Daily PRN Catalaya Garr T, MD       mirtazapine (REMERON) tablet 30 mg  30 mg Oral QHS Leeandra Ellerson T, MD    30 mg at 07/09/22 2123   multivitamin with minerals tablet 1 tablet  1 tablet Oral Daily Courtlyn Aki, [name], MD   1 tablet at 07/10/22 8841   QUEtiapine (SEROQUEL) tablet 400 mg  400 mg Oral QHS Susana Duell T, MD   400 mg at 07/09/22 2123   thiamine (VITAMIN B1) tablet 100 mg  100 mg Oral Daily Vanessia Bokhari, [name], MD   100 mg at 07/10/22 6606    Lab Results: No results found for this or any previous visit (from the past 48 hour(s)).  Blood Alcohol level:  Lab Results  Component Value Date   ETH <10 03/29/2022   ETH <10 09/12/2021    Metabolic Disorder Labs: Lab Results  Component Value Date   HGBA1C 5.6 10/21/2021   MPG 114 10/21/2021   MPG 111 10/02/2021   Lab Results  Component Value Date   PROLACTIN 13.6 04/16/2015   Lab Results  Component Value Date   CHOL 189 10/21/2021   TRIG 52 10/21/2021   HDL 50 10/21/2021   CHOLHDL 3.8 10/21/2021   VLDL 10 10/21/2021   LDLCALC 129 (H) 10/21/2021   LDLCALC 93 10/01/2020    Physical Findings: AIMS: Facial and Oral Movements Muscles of Facial Expression: None, normal Lips and Perioral Area: None, normal Jaw: None, normal Tongue: None, normal,Extremity Movements Upper (arms, wrists, hands, fingers): None, normal Lower (legs, knees, ankles, toes): None, normal, Trunk Movements Neck, shoulders, hips: None, normal, Overall Severity Severity of abnormal movements (highest score from questions above): None, normal Incapacitation due to abnormal movements: None, normal Patient's awareness of abnormal movements (rate only patient's report): No Awareness, Dental Status Current problems with teeth and/or dentures?: No Does patient usually wear dentures?: No  CIWA:    COWS:     Musculoskeletal: Strength & Muscle Tone: within normal limits Gait & Station: normal Patient leans: [name]  Psychiatric Specialty Exam:  Presentation  General Appearance:  Disheveled  Eye Contact: Fair  Speech: Pressured  Speech  Volume: Increased  Handedness: Right   Mood and Affect  Mood: Irritable; Labile; Angry  Affect: Blunt; Labile   Thought Process  Thought Processes: Disorganized; Irrevelant  Descriptions of Associations:Loose  Orientation:Partial  Thought Content:Illogical; Rumination; Scattered; Tangential; Delusions  History of Schizophrenia/Schizoaffective disorder:Yes  Duration of Psychotic Symptoms:Greater than six months  Hallucinations:No data recorded Ideas of Reference:Percusatory; Paranoia  Suicidal Thoughts:No data recorded Homicidal Thoughts:No data recorded  Sensorium  Memory: Immediate Fair; Recent Fair; Remote Fair  Judgment: Impaired  Insight: Poor; Lacking   Executive Functions  Concentration: Poor  Attention Span: Poor  Recall:  Poor  Fund of Knowledge: Poor  Language: Poor   Psychomotor Activity  Psychomotor Activity:No data recorded  Assets  Assets: Physical Health; Resilience; Housing; Social Support   Sleep  Sleep:No data recorded   Physical Exam: Physical Exam Vitals and nursing note reviewed.  Constitutional:      Appearance: Normal appearance.  HENT:     Head: Normocephalic and atraumatic.     Mouth/Throat:     Pharynx: Oropharynx is clear.  Eyes:     Pupils: Pupils are equal, round, and reactive to light.  Cardiovascular:     Rate and Rhythm: Normal rate and regular rhythm.  Pulmonary:     Effort: Pulmonary effort is normal.     Breath sounds: Normal breath sounds.  Abdominal:     General: Abdomen is flat.     Palpations: Abdomen is soft.  Musculoskeletal:        General: Normal range of motion.  Skin:    General: Skin is warm and dry.  Neurological:     General: No focal deficit present.     Mental Status: She is alert. Mental status is at baseline.  Psychiatric:        Attention and Perception: She is inattentive.        Mood and Affect: Mood normal. Affect is blunt.        Speech: Speech is delayed.         Behavior: Behavior is slowed.        Thought Content: Thought content normal.    Review of Systems  Constitutional: Negative.   HENT: Negative.    Eyes: Negative.   Respiratory: Negative.    Cardiovascular: Negative.   Gastrointestinal: Negative.   Musculoskeletal: Negative.   Skin: Negative.   Neurological: Negative.   Psychiatric/Behavioral: Negative.     Blood pressure 115/66, pulse (!) 55, temperature 98 F (36.7 C), temperature source Oral, resp. rate 18, height 5\' 4"  (1.626 m), weight 40.4 kg, SpO2 100 %. Body mass index is 15.28 kg/m.   Treatment Plan Summary: Medication management and Plan no change to medication management.  Family working on .  Discharge plan still pending.  [employer], MD 07/10/2022, 3:18 PM

## 2022-07-11 DIAGNOSIS — F251 Schizoaffective disorder, depressive type: Secondary | ICD-10-CM | POA: Diagnosis not present

## 2022-07-11 NOTE — Plan of Care (Signed)
D- Patient alert and oriented. Patient presented in a pleasant mood on assessment reporting that she slept good last night and had no complaints to voice to this Clinical research associate. Patient denied any signs/symptoms of depression and anxiety. Patient also denied SI, HI, AVH, and pain at this time. Patient's reported goal for today, per her self-inventory, is once again "discharge". Patient has been observed doing laundry, as well as sitting out in the dayroom, watching television, without any issues.  A- Scheduled medications administered to patient, per MD orders. Support and encouragement provided.  Routine safety checks conducted every 15 minutes.  Patient informed to notify staff with problems or concerns.  R- No adverse drug reactions noted. Patient contracts for safety at this time. Patient compliant with medications and treatment plan. Patient receptive, calm, and cooperative. Patient remains safe at this time.  Problem: Education: Goal: Knowledge of General Education information will improve Description: Including pain rating scale, medication(s)/side effects and non-pharmacologic comfort measures Outcome: Progressing   Problem: Health Behavior/Discharge Planning: Goal: Ability to manage health-related needs will improve Outcome: Progressing   Problem: Clinical Measurements: Goal: Ability to maintain clinical measurements within normal limits will improve Outcome: Progressing Goal: Will remain free from infection Outcome: Progressing Goal: Diagnostic test results will improve Outcome: Progressing Goal: Respiratory complications will improve Outcome: Progressing Goal: Cardiovascular complication will be avoided Outcome: Progressing   Problem: Activity: Goal: Risk for activity intolerance will decrease Outcome: Progressing   Problem: Nutrition: Goal: Adequate nutrition will be maintained Outcome: Progressing   Problem: Coping: Goal: Level of anxiety will decrease Outcome: Progressing    Problem: Elimination: Goal: Will not experience complications related to bowel motility Outcome: Progressing Goal: Will not experience complications related to urinary retention Outcome: Progressing   Problem: Pain Managment: Goal: General experience of comfort will improve Outcome: Progressing   Problem: Safety: Goal: Ability to remain free from injury will improve Outcome: Progressing   Problem: Skin Integrity: Goal: Risk for impaired skin integrity will decrease Outcome: Progressing   Problem: Activity: Goal: Will verbalize the importance of balancing activity with adequate rest periods Outcome: Progressing   Problem: Education: Goal: Will be free of psychotic symptoms Outcome: Progressing Goal: Knowledge of the prescribed therapeutic regimen will improve Outcome: Progressing   Problem: Coping: Goal: Coping ability will improve Outcome: Progressing Goal: Will verbalize feelings Outcome: Progressing   Problem: Health Behavior/Discharge Planning: Goal: Compliance with prescribed medication regimen will improve Outcome: Progressing   Problem: Nutritional: Goal: Ability to achieve adequate nutritional intake will improve Outcome: Progressing   Problem: Role Relationship: Goal: Ability to communicate needs accurately will improve Outcome: Progressing Goal: Ability to interact with others will improve Outcome: Progressing   Problem: Safety: Goal: Ability to redirect hostility and anger into socially appropriate behaviors will improve Outcome: Progressing Goal: Ability to remain free from injury will improve Outcome: Progressing   Problem: Self-Care: Goal: Ability to participate in self-care as condition permits will improve Outcome: Progressing   Problem: Self-Concept: Goal: Will verbalize positive feelings about self Outcome: Progressing   Problem: Education: Goal: Knowledge of [facility] Education information/materials will improve Outcome:  Progressing Goal: Emotional status will improve Outcome: Progressing Goal: Mental status will improve Outcome: Progressing Goal: Verbalization of understanding the information provided will improve Outcome: Progressing   Problem: Activity: Goal: Interest or engagement in activities will improve Outcome: Progressing Goal: Sleeping patterns will improve Outcome: Progressing   Problem: Coping: Goal: Ability to verbalize frustrations and anger appropriately will improve Outcome: Progressing Goal: Ability to demonstrate self-control will  improve Outcome: Progressing   Problem: Health Behavior/Discharge Planning: Goal: Identification of resources available to assist in meeting health care needs will improve Outcome: Progressing Goal: Compliance with treatment plan for underlying cause of condition will improve Outcome: Progressing   Problem: Physical Regulation: Goal: Ability to maintain clinical measurements within normal limits will improve Outcome: Progressing   Problem: Safety: Goal: Periods of time without injury will increase Outcome: Progressing

## 2022-07-11 NOTE — Progress Notes (Signed)
This writer went to get patient for visitation and upon entering patient's room, she stated "I'm not supposed to have any visitors". This Clinical research associate went out to explain to patient's sister, [name] what patient said. Patient's sister stated that she will not force patient to see her bevcuase she doesn't want her to be upset over the holiday. This writer went back to patient's room and told her that her visitor was [name], and patient stated "everybody is using that name, it has something to do with Dr. Toni Amend' family".

## 2022-07-11 NOTE — Progress Notes (Addendum)
[facility] MD Progress Note  07/11/2022 8:32 AM [name]  MRN:  [id number] Subjective:  Patient has an irritable demeanor today.  She is not aggressive.  Indicates that she was admitted to the hospital for "the wrong reason," states that came here because she was "trespassing on my own property."  Says that she has her own home and has no family.  Does not want to talk about them.  He denies depression and irritability anxiety.  Sleeping and eating well.  Denies symptoms of psychosis as well though she has admitted auditory hallucinations in the past in addition to feeling people's energy. Tells me that she needs to discharge soon to her own place.   Principal Problem: Schizoaffective disorder, depressive type (HCC) Diagnosis: Principal Problem:   Schizoaffective disorder, depressive type (HCC)  Total Time spent with patient: 26 minutes  Past Psychiatric History: Past history of schizoaffective disorder  Past Medical History:  Past Medical History:  Diagnosis Date   Anemia 10/02/2021   Non compliance w medication regimen    Schizophrenia [facility])     Past Surgical History:  Procedure Laterality Date   BIOPSY  09/25/2021   Procedure: BIOPSY;  Surgeon: Rachael Fee, MD;  Location: [facility];  Service: Gastroenterology;;   ESOPHAGOGASTRODUODENOSCOPY (EGD) WITH PROPOFOL [name] 09/25/2021   Procedure: ESOPHAGOGASTRODUODENOSCOPY (EGD) WITH PROPOFOL;  Surgeon: Rachael Fee, MD;  Location: [facility];  Service: Gastroenterology;  Laterality: [name];  With NGT placement   Family History: History reviewed. No pertinent family history. Family Psychiatric  History: See previous Social History:  Social History   Substance and Sexual Activity  Alcohol Use Not Currently   Comment: Refuses to disclose how much     Social History   Substance and Sexual Activity  Drug Use No   Comment: Refuses to answer    Social History   Socioeconomic History   Marital status: Single    Spouse name: Not on  file   Number of children: Not on file   Years of education: Not on file   Highest education level: Not on file  Occupational History   Not on file  Tobacco Use   Smoking status: Every Day    Packs/day: 2.00    Types: Cigarettes   Smokeless tobacco: Never  Substance and Sexual Activity   Alcohol use: Not Currently    Comment: Refuses to disclose how much   Drug use: No    Comment: Refuses to answer   Sexual activity: Not Currently    Comment: refused to answer  Other Topics Concern   Not on file  Social History Narrative   Not on file   Social Determinants of Health   Financial Resource Strain: Not on file  Food Insecurity: No Food Insecurity (04/08/2022)   Hunger Vital Sign    Worried About Running Out of Food in the Last Year: Never true    Ran Out of Food in the Last Year: Never true  Transportation Needs: No Transportation Needs (04/08/2022)   PRAPARE - Administrator, Civil Service (Medical): No    Lack of Transportation (Non-Medical): No  Physical Activity: Not on file  Stress: Not on file  Social Connections: Not on file   Additional Social History:                         Sleep: Fair  Appetite:  Fair  Current Medications: Current Facility-Administered Medications  Medication Dose Route Frequency Provider Last  Rate Last Admin   acetaminophen (TYLENOL) tablet 650 mg  650 mg Oral Q6H PRN [name], [name], MD       alum & mag hydroxide-simeth (MAALOX/MYLANTA) 200-200-20 MG/5ML suspension 30 mL  30 mL Oral Q4H PRN [name], [name], MD       haloperidol (HALDOL) tablet 5 mg  5 mg Oral BID [name], [name], MD   5 mg at 07/10/22 1629   Or   haloperidol lactate (HALDOL) injection 5 mg  5 mg Intramuscular BID [name], [name], MD       lithium carbonate capsule 600 mg  600 mg Oral QHS [name], [name], MD   600 mg at 07/10/22 2114   LORazepam (ATIVAN) tablet 1 mg  1 mg Oral BID [name], [name], MD   1 mg at 07/10/22 1629   Or   LORazepam (ATIVAN)  injection 1 mg  1 mg Intramuscular BID [name], [name], MD       magnesium hydroxide (MILK OF MAGNESIA) suspension 30 mL  30 mL Oral Daily PRN [name], [name], MD       mirtazapine (REMERON) tablet 30 mg  30 mg Oral QHS [name], [name], MD   30 mg at 07/10/22 2114   multivitamin with minerals tablet 1 tablet  1 tablet Oral Daily [name], [name], MD   1 tablet at 07/10/22 0093   QUEtiapine (SEROQUEL) tablet 400 mg  400 mg Oral QHS [name], [name], MD   400 mg at 07/10/22 2114   thiamine (VITAMIN B1) tablet 100 mg  100 mg Oral Daily [name], [name], MD   100 mg at 07/10/22 8182    Lab Results: No results found for this or any previous visit (from the past 48 hour(s)).  Blood Alcohol level:  Lab Results  Component Value Date   ETH <10 03/29/2022   ETH <10 09/12/2021    Metabolic Disorder Labs: Lab Results  Component Value Date   HGBA1C 5.6 10/21/2021   MPG 114 10/21/2021   MPG 111 10/02/2021   Lab Results  Component Value Date   PROLACTIN 13.6 04/16/2015   Lab Results  Component Value Date   CHOL 189 10/21/2021   TRIG 52 10/21/2021   HDL 50 10/21/2021   CHOLHDL 3.8 10/21/2021   VLDL 10 10/21/2021   LDLCALC 129 (H) 10/21/2021   LDLCALC 93 10/01/2020    Physical Findings: AIMS: Facial and Oral Movements Muscles of Facial Expression: None, normal Lips and Perioral Area: None, normal Jaw: None, normal Tongue: None, normal,Extremity Movements Upper (arms, wrists, hands, fingers): None, normal Lower (legs, knees, ankles, toes): None, normal, Trunk Movements Neck, shoulders, hips: None, normal, Overall Severity Severity of abnormal movements (highest score from questions above): None, normal Incapacitation due to abnormal movements: None, normal Patient's awareness of abnormal movements (rate only patient's report): No Awareness, Dental Status Current problems with teeth and/or dentures?: No Does patient usually wear dentures?: No  CIWA:    COWS:      Musculoskeletal: Strength & Muscle Tone: within normal limits Gait & Station: normal Patient leans: [name]  Psychiatric Specialty Exam:  Presentation  General Appearance:  Disheveled  Eye Contact: Fair  Speech: wnl  Handedness: Right   Mood and Affect  Mood: okay  Affect: irritable   Thought Process  Thought Processes: Disorganized; Irrevelant  Descriptions of Associations:Loose  Orientation:Partial  Thought Content:Illogical; Rumination; Scattered; Tangential; Delusions  History of Schizophrenia/Schizoaffective disorder:Yes  Duration of Psychotic Symptoms:Greater than six months  No  psychosis No s/I  Sensorium  Memory: Immediate Fair; Recent Fair; Remote Fair  Judgment: Impaired  Insight: Poor; Lacking   Executive Functions  Concentration: Poor  Attention Span: Poor  Recall: Poor  Fund of Knowledge: Poor  Language: Poor   Psychomotor Activity  Psychomotor Activity:wnl    Physical Exam: Physical Exam Vitals and nursing note reviewed.  Constitutional:      Appearance: Normal appearance.  HENT:     Head: Normocephalic and atraumatic.     Mouth/Throat:     Pharynx: Oropharynx is clear.  Eyes:     Pupils: Pupils are equal, round, and reactive to light.  Cardiovascular:     Rate and Rhythm: Normal rate and regular rhythm.  Pulmonary:     Effort: Pulmonary effort is normal.     Breath sounds: Normal breath sounds.  Abdominal:     General: Abdomen is flat.     Palpations: Abdomen is soft.  Musculoskeletal:        General: Normal range of motion.  Skin:    General: Skin is warm and dry.  Neurological:     General: No focal deficit present.     Mental Status: She is alert. Mental status is at baseline.  Psychiatric:        Attention and Perception: She is inattentive.        Mood and Affect: Mood normal. Affect is blunt.        Speech: Speech is delayed.        Behavior: Behavior is slowed.        Thought Content:  Thought content normal.    Review of Systems  Constitutional: Negative.   HENT: Negative.    Eyes: Negative.   Respiratory: Negative.    Cardiovascular: Negative.   Gastrointestinal: Negative.   Musculoskeletal: Negative.   Skin: Negative.   Neurological: Negative.   Psychiatric/Behavioral: Negative.     Blood pressure 107/67, pulse (!) 52, temperature 98.1 F (36.7 C), temperature source Oral, resp. rate 18, height 5\' 4"  (1.626 m), weight 40.4 kg, SpO2 100 %. Body mass index is 15.28 kg/m.   Treatment Plan Summary: Medication management and Plan no change to medication management.  Family working on .  Discharge plan still pending.  12/23 No changes  1/24, MD 07/11/2022, 8:32 AM

## 2022-07-12 DIAGNOSIS — F251 Schizoaffective disorder, depressive type: Secondary | ICD-10-CM | POA: Diagnosis not present

## 2022-07-12 NOTE — Progress Notes (Signed)
   07/12/22 0200  Psych Admission Type (Psych Patients Only)  Admission Status Involuntary  Psychosocial Assessment  Patient Complaints None  Eye Contact Fair  Facial Expression Flat  Affect Appropriate to circumstance  Speech Logical/coherent  Interaction Isolative;Minimal  Motor Activity Slow  Appearance/Hygiene Unremarkable  Behavior Characteristics Cooperative;Appropriate to situation  Mood Pleasant  Thought Process  Coherency WDL  Content WDL  Delusions WDL  Perception WDL  Hallucination None reported or observed  Judgment WDL  Confusion None  Danger to Self  Current suicidal ideation? Denies  Agreement Not to Harm Self Yes  Description of Agreement verbal   Patient compliant with medications denies SI/HI/A/VH and verbally contracted for safety. Support and encouragement provided. Q 15 minutes safety checks ongoing without self harm gestures

## 2022-07-12 NOTE — Plan of Care (Signed)
D: Patient alert and oriented. Patient denies pain. Patient denies anxiety and depression. Patient denies SI/HI/AVH. Patient remains isolative to room during shift but did sit in the dayroom and watched some television before dinner arrived. Patient does come out of room to check the time and then returns back to the room.  A: Scheduled medications administered to patient, per MD orders.  Support and encouragement provided to patient.  Q15 minute safety checks maintained.   R: Patient compliant with medication administration and treatment plan. No adverse drug reactions noted. Patient remains safe on the unit at this time. Problem: Education: Goal: Knowledge of  General Education information/materials will improve Outcome: Progressing Goal: Verbalization of understanding the information provided will improve Outcome: Progressing   Problem: Health Behavior/Discharge Planning: Goal: Compliance with treatment plan for underlying cause of condition will improve Outcome: Progressing   Problem: Physical Regulation: Goal: Ability to maintain clinical measurements within normal limits will improve Outcome: Progressing   Problem: Safety: Goal: Periods of time without injury will increase Outcome: Progressing

## 2022-07-12 NOTE — Progress Notes (Addendum)
I assumed care for Ms Buesing at about 19:30.She was lying down in her bed, alert, denied any new pain, denied any avh/hi/si, she has remained self isolative to her room this evening except for medications and meals. No prns needed or requested for thus far.She is being monitored as ordered.    No changes in condition from baseline, pt is resting in bed at this time, being monitored as ordered. Vitals WNL.

## 2022-07-12 NOTE — Progress Notes (Signed)
[facility] MD Progress Note  07/12/2022 9:41 AM [name]  MRN:  [id number] Subjective:  12/24 Patient reaffirms this morning that she would like to discharge to her own place.  Indicates that she is fully independent, and does not need help.  No new medical problems.  No other significant events in the past 24 hours.  She is taking her medications.  She does not seem to be internally preoccupied throughout the day.  Subjectively denies psychosis, particularly hallucinations.  Denies feeling confused today or having odd/bizarre occurrences on the unit.  Denies new medical problems. Offers limited information this AM.   12/23 Patient has an irritable demeanor today.  She is not aggressive.  Indicates that she was admitted to the hospital for "the wrong reason," states that came here because she was "trespassing on my own property."  Says that she has her own home and has no family.  Does not want to talk about them.  He denies depression and irritability anxiety.  Sleeping and eating well.  Denies symptoms of psychosis as well though she has admitted auditory hallucinations in the past in addition to feeling people's energy. Tells me that she needs to discharge soon to her own place.   Principal Problem: Schizoaffective disorder, depressive type ([place]) Diagnosis: Principal Problem:   Schizoaffective disorder, depressive type ([place])  Total Time spent with patient: 25 minutes  Past Psychiatric History: Past history of schizoaffective disorder  Past Medical History:  Past Medical History:  Diagnosis Date   Anemia 10/02/2021   Non compliance w medication regimen    Schizophrenia [facility])     Past Surgical History:  Procedure Laterality Date   BIOPSY  09/25/2021   Procedure: BIOPSY;  Surgeon: Milus Banister, MD;  Location: [facility];  Service: Gastroenterology;;   ESOPHAGOGASTRODUODENOSCOPY (EGD) WITH PROPOFOL [name] 09/25/2021   Procedure: ESOPHAGOGASTRODUODENOSCOPY (EGD) WITH PROPOFOL;  Surgeon:  Milus Banister, MD;  Location: [facility];  Service: Gastroenterology;  Laterality: [name];  With NGT placement   Family History: History reviewed. No pertinent family history. Family Psychiatric  History: See previous Social History:  Social History   Substance and Sexual Activity  Alcohol Use Not Currently   Comment: Refuses to disclose how much     Social History   Substance and Sexual Activity  Drug Use No   Comment: Refuses to answer    Social History   Socioeconomic History   Marital status: Single    Spouse name: Not on file   Number of children: Not on file   Years of education: Not on file   Highest education level: Not on file  Occupational History   Not on file  Tobacco Use   Smoking status: Every Day    Packs/day: 2.00    Types: Cigarettes   Smokeless tobacco: Never  Substance and Sexual Activity   Alcohol use: Not Currently    Comment: Refuses to disclose how much   Drug use: No    Comment: Refuses to answer   Sexual activity: Not Currently    Comment: refused to answer  Other Topics Concern   Not on file  Social History Narrative   Not on file   Social Determinants of Health   Financial Resource Strain: Not on file  Food Insecurity: No Food Insecurity (04/08/2022)   Hunger Vital Sign    Worried About Running Out of Food in the Last Year: Never true    Ran Out of Food in the Last Year: Never true  Transportation  Needs: No Transportation Needs (04/08/2022)   PRAPARE - Hydrologist (Medical): No    Lack of Transportation (Non-Medical): No  Physical Activity: Not on file  Stress: Not on file  Social Connections: Not on file   Additional Social History:                         Sleep: Fair  Appetite:  Fair  Current Medications: Current Facility-Administered Medications  Medication Dose Route Frequency Provider Last Rate Last Admin   acetaminophen (TYLENOL) tablet 650 mg  650 mg Oral Q6H PRN [name], John  T, MD       alum & mag hydroxide-simeth (MAALOX/MYLANTA) 200-200-20 MG/5ML suspension 30 mL  30 mL Oral Q4H PRN [name], [name], MD       haloperidol (HALDOL) tablet 5 mg  5 mg Oral BID [name], [name], MD   5 mg at 07/12/22 [id number]   Or   haloperidol lactate (HALDOL) injection 5 mg  5 mg Intramuscular BID [name], [name], MD       lithium carbonate capsule 600 mg  600 mg Oral QHS [name], [name], MD   600 mg at 07/11/22 2114   LORazepam (ATIVAN) tablet 1 mg  1 mg Oral BID [name], [name], MD   1 mg at 07/12/22 [id number]   Or   LORazepam (ATIVAN) injection 1 mg  1 mg Intramuscular BID [name], [name], MD       magnesium hydroxide (MILK OF MAGNESIA) suspension 30 mL  30 mL Oral Daily PRN [name], [name], MD       mirtazapine (REMERON) tablet 30 mg  30 mg Oral QHS [name], [name], MD   30 mg at 07/11/22 2114   multivitamin with minerals tablet 1 tablet  1 tablet Oral Daily [name], [name], MD   1 tablet at 07/12/22 [id number]   QUEtiapine (SEROQUEL) tablet 400 mg  400 mg Oral QHS [name], [name], MD   400 mg at 07/11/22 2114   thiamine (VITAMIN B1) tablet 100 mg  100 mg Oral Daily [name], [name], MD   100 mg at 07/12/22 [id number]    Lab Results: No results found for this or any previous visit (from the past 48 hour(s)).  Blood Alcohol level:  Lab Results  Component Value Date   ETH <10 03/29/2022   ETH <10 123XX123    Metabolic Disorder Labs: Lab Results  Component Value Date   HGBA1C 5.6 10/21/2021   MPG 114 10/21/2021   MPG 111 10/02/2021   Lab Results  Component Value Date   PROLACTIN 13.6 04/16/2015   Lab Results  Component Value Date   CHOL 189 10/21/2021   TRIG 52 10/21/2021   HDL 50 10/21/2021   CHOLHDL 3.8 10/21/2021   VLDL 10 10/21/2021   LDLCALC 129 (H) 10/21/2021   LDLCALC 93 10/01/2020    Physical Findings: AIMS: Facial and Oral Movements Muscles of Facial Expression: None, normal Lips and Perioral Area: None, normal Jaw: None, normal Tongue: None, normal,Extremity  Movements Upper (arms, wrists, hands, fingers): None, normal Lower (legs, knees, ankles, toes): None, normal, Trunk Movements Neck, shoulders, hips: None, normal, Overall Severity Severity of abnormal movements (highest score from questions above): None, normal Incapacitation due to abnormal movements: None, normal Patient's awareness of abnormal movements (rate only patient's report): No Awareness, Dental Status Current problems with teeth and/or dentures?: No Does patient usually wear dentures?: No  CIWA:  COWS:     Musculoskeletal: Strength & Muscle Tone: within normal limits Gait & Station: normal Patient leans: [name]  Psychiatric Specialty Exam:  Presentation  General Appearance:  Disheveled  Eye Contact: Fair  Speech: wnl  Handedness: Right   Mood and Affect  Mood: Fine  Affect: irritable   Thought Process  Thought Processes: Disorganized; Irrevelant  Descriptions of Associations:Loose  Orientation:Partial  Thought Content:Illogical; Rumination; Scattered; Tangential; Delusions  History of Schizophrenia/Schizoaffective disorder:Yes  Duration of Psychotic Symptoms:Greater than six months  No psychosis No s/I  Sensorium  Memory: Immediate Fair; Recent Fair; Remote Fair  Judgment: Impaired  Insight: Poor; Lacking   Executive Functions  Concentration: Poor  Attention Span: Poor  Recall: Poor  Fund of Knowledge: Poor  Language: Poor   Psychomotor Activity  Psychomotor Activity:wnl    Physical Exam: Physical Exam Vitals and nursing note reviewed.  Constitutional:      Appearance: Normal appearance.  HENT:     Head: Normocephalic and atraumatic.     Mouth/Throat:     Pharynx: Oropharynx is clear.  Eyes:     Pupils: Pupils are equal, round, and reactive to light.  Cardiovascular:     Rate and Rhythm: Normal rate and regular rhythm.  Pulmonary:     Effort: Pulmonary effort is normal.     Breath sounds: Normal  breath sounds.  Abdominal:     General: Abdomen is flat.     Palpations: Abdomen is soft.  Musculoskeletal:        General: Normal range of motion.  Skin:    General: Skin is warm and dry.  Neurological:     General: No focal deficit present.     Mental Status: She is alert. Mental status is at baseline.  Psychiatric:        Attention and Perception: She is inattentive.        Mood and Affect: Mood normal. Affect is blunt.        Speech: Speech is delayed.        Behavior: Behavior is slowed.        Thought Content: Thought content normal.    Review of Systems  Constitutional: Negative.   HENT: Negative.    Eyes: Negative.   Respiratory: Negative.    Cardiovascular: Negative.   Gastrointestinal: Negative.   Musculoskeletal: Negative.   Skin: Negative.   Neurological: Negative.   Psychiatric/Behavioral: Negative.     Blood pressure 104/72, pulse 60, temperature 97.7 F (36.5 C), temperature source Oral, resp. rate 18, height 5\' 4"  (1.626 m), weight 40.4 kg, SpO2 100 %. Body mass index is 15.28 kg/m.   Treatment Plan Summary: Medication management and Plan no change to medication management.  Family working on .  Discharge plan still pending.  12/23 No changes  12/24 No changes  1/25, MD 07/12/2022, 9:41 AM

## 2022-07-13 DIAGNOSIS — F251 Schizoaffective disorder, depressive type: Secondary | ICD-10-CM | POA: Diagnosis not present

## 2022-07-13 NOTE — Plan of Care (Signed)
D: Patient alert and oriented. Patient denies pain. Patient denies anxiety and depression. Patient denies SI/HI/AVH. Patient less isolative during shift compared to previous shifts. Patient did come out of room to participate in holiday activities on the unit.   A: Scheduled medications administered to patient, per MD orders.  Support and encouragement provided to patient.  Q15 minute safety checks maintained.   R: Patient compliant with medication administration and treatment plan. No adverse drug reactions noted. Patient remains safe on the unit at this time. Problem: Education: Goal: Knowledge of [facility] [facility] Education information/materials will improve Outcome: Progressing Goal: Verbalization of understanding the information provided will improve Outcome: Progressing   Problem: Health Behavior/Discharge Planning: Goal: Compliance with treatment plan for underlying cause of condition will improve Outcome: Progressing   Problem: Physical Regulation: Goal: Ability to maintain clinical measurements within normal limits will improve Outcome: Progressing   Problem: Safety: Goal: Periods of time without injury will increase Outcome: Progressing

## 2022-07-13 NOTE — Progress Notes (Signed)
[facility] MD Progress Note  07/13/2022 8:27 AM [name]  MRN:  [id number] Subjective:  12/25 Patient was seen in her room today, as per her usual in the morning.  No significant events over the past 24 hours.  Says that she does not celebrate Christmas or any other holidays.  When asked if she has kids, tells me that is personal.  She does not have any concerns.  No new medical problems.  She slept well.  Appetite is sufficient, per patient.  Mentions people coming to "vaporizing life... and they just go back."  Apparently the name of one of these people is [place].  12/24 Patient reaffirms this morning that she would like to discharge to her own place.  Indicates that she is fully independent, and does not need help.  No new medical problems.  No other significant events in the past 24 hours.  She is taking her medications.  She does not seem to be internally preoccupied throughout the day.  Subjectively denies psychosis, particularly hallucinations.  Denies feeling confused today or having odd/bizarre occurrences on the unit.  Denies new medical problems. Offers limited information this AM.   12/23 Patient has an irritable demeanor today.  She is not aggressive.  Indicates that she was admitted to the hospital for "the wrong reason," states that came here because she was "trespassing on my own property."  Says that she has her own home and has no family.  Does not want to talk about them.  He denies depression and irritability anxiety.  Sleeping and eating well.  Denies symptoms of psychosis as well though she has admitted auditory hallucinations in the past in addition to feeling people's energy. Tells me that she needs to discharge soon to her own place.   Principal Problem: Schizoaffective disorder, depressive type ([place]) Diagnosis: Principal Problem:   Schizoaffective disorder, depressive type ([place])  Total Time spent with patient: 25 minutes  Past Psychiatric History: Past history of  schizoaffective disorder  Past Medical History:  Past Medical History:  Diagnosis Date   Anemia 10/02/2021   Non compliance w medication regimen    Schizophrenia [facility])     Past Surgical History:  Procedure Laterality Date   BIOPSY  09/25/2021   Procedure: BIOPSY;  Surgeon: Milus Banister, MD;  Location: [facility];  Service: Gastroenterology;;   ESOPHAGOGASTRODUODENOSCOPY (EGD) WITH PROPOFOL [name] 09/25/2021   Procedure: ESOPHAGOGASTRODUODENOSCOPY (EGD) WITH PROPOFOL;  Surgeon: Milus Banister, MD;  Location: [facility];  Service: Gastroenterology;  Laterality: [name];  With NGT placement   Family History: History reviewed. No pertinent family history. Family Psychiatric  History: See previous Social History:  Social History   Substance and Sexual Activity  Alcohol Use Not Currently   Comment: Refuses to disclose how much     Social History   Substance and Sexual Activity  Drug Use No   Comment: Refuses to answer    Social History   Socioeconomic History   Marital status: Single    Spouse name: Not on file   Number of children: Not on file   Years of education: Not on file   Highest education level: Not on file  Occupational History   Not on file  Tobacco Use   Smoking status: Every Day    Packs/day: 2.00    Types: Cigarettes   Smokeless tobacco: Never  Substance and Sexual Activity   Alcohol use: Not Currently    Comment: Refuses to disclose how much   Drug use: No  Comment: Refuses to answer   Sexual activity: Not Currently    Comment: refused to answer  Other Topics Concern   Not on file  Social History Narrative   Not on file   Social Determinants of Health   Financial Resource Strain: Not on file  Food Insecurity: No Food Insecurity (04/08/2022)   Hunger Vital Sign    Worried About Running Out of Food in the Last Year: Never true    Ran Out of Food in the Last Year: Never true  Transportation Needs: No Transportation Needs (04/08/2022)   PRAPARE -  Hydrologist (Medical): No    Lack of Transportation (Non-Medical): No  Physical Activity: Not on file  Stress: Not on file  Social Connections: Not on file   Additional Social History:                         Sleep: Fair  Appetite:  Fair  Current Medications: Current Facility-Administered Medications  Medication Dose Route Frequency Provider Last Rate Last Admin   acetaminophen (TYLENOL) tablet 650 mg  650 mg Oral Q6H PRN [name], [name], MD       alum & mag hydroxide-simeth (MAALOX/MYLANTA) 200-200-20 MG/5ML suspension 30 mL  30 mL Oral Q4H PRN [name], [name], MD       haloperidol (HALDOL) tablet 5 mg  5 mg Oral BID [name], [name], MD   5 mg at 07/13/22 [id number]   Or   haloperidol lactate (HALDOL) injection 5 mg  5 mg Intramuscular BID [name], [name], MD       lithium carbonate capsule 600 mg  600 mg Oral QHS [name], [name], MD   600 mg at 07/12/22 2131   LORazepam (ATIVAN) tablet 1 mg  1 mg Oral BID [name], [name], MD   1 mg at 07/13/22 [id number]   Or   LORazepam (ATIVAN) injection 1 mg  1 mg Intramuscular BID [name], [name], MD       magnesium hydroxide (MILK OF MAGNESIA) suspension 30 mL  30 mL Oral Daily PRN [name], [name], MD       mirtazapine (REMERON) tablet 30 mg  30 mg Oral QHS [name], [name], MD   30 mg at 07/12/22 2132   multivitamin with minerals tablet 1 tablet  1 tablet Oral Daily [name], [name], MD   1 tablet at 07/13/22 0756   QUEtiapine (SEROQUEL) tablet 400 mg  400 mg Oral QHS [name], [name], MD   400 mg at 07/12/22 2131   thiamine (VITAMIN B1) tablet 100 mg  100 mg Oral Daily [name], [name], MD   100 mg at 07/13/22 0756    Lab Results: No results found for this or any previous visit (from the past 48 hour(s)).  Blood Alcohol level:  Lab Results  Component Value Date   ETH <10 03/29/2022   ETH <10 123XX123    Metabolic Disorder Labs: Lab Results  Component Value Date   HGBA1C 5.6 10/21/2021   MPG 114  10/21/2021   MPG 111 10/02/2021   Lab Results  Component Value Date   PROLACTIN 13.6 04/16/2015   Lab Results  Component Value Date   CHOL 189 10/21/2021   TRIG 52 10/21/2021   HDL 50 10/21/2021   CHOLHDL 3.8 10/21/2021   VLDL 10 10/21/2021   LDLCALC 129 (H) 10/21/2021   LDLCALC 93 10/01/2020    Physical Findings: AIMS: Facial and  Oral Movements Muscles of Facial Expression: None, normal Lips and Perioral Area: None, normal Jaw: None, normal Tongue: None, normal,Extremity Movements Upper (arms, wrists, hands, fingers): None, normal Lower (legs, knees, ankles, toes): None, normal, Trunk Movements Neck, shoulders, hips: None, normal, Overall Severity Severity of abnormal movements (highest score from questions above): None, normal Incapacitation due to abnormal movements: None, normal Patient's awareness of abnormal movements (rate only patient's report): No Awareness, Dental Status Current problems with teeth and/or dentures?: No Does patient usually wear dentures?: No  CIWA:    COWS:     Musculoskeletal: Strength & Muscle Tone: within normal limits Gait & Station: normal Patient leans: [name]  Psychiatric Specialty Exam:  Presentation  General Appearance:  Disheveled  Eye Contact: Fair  Speech: wnl  Handedness: Right   Mood and Affect  Mood: okay  Affect: flat   Thought Process  Thought Processes: Disorganized; Irrevelant  Descriptions of Associations:Loose  Orientation:Partial  Thought Content:Illogical; Rumination; Scattered; Tangential; Delusions  History of Schizophrenia/Schizoaffective disorder:Yes  Duration of Psychotic Symptoms:Greater than six months  No psychosis No s/I  Sensorium  Memory: Immediate Fair; Recent Fair; Remote Fair  Judgment: Impaired  Insight: Poor; Lacking   Executive Functions  Concentration: Poor  Attention Span: Poor  Recall: Poor  Fund of  Knowledge: Poor  Language: Poor   Psychomotor Activity  Psychomotor Activity:wnl    Physical Exam: Physical Exam Vitals and nursing note reviewed.  Constitutional:      Appearance: Normal appearance.  HENT:     Head: Normocephalic and atraumatic.     Mouth/Throat:     Pharynx: Oropharynx is clear.  Eyes:     Pupils: Pupils are equal, round, and reactive to light.  Cardiovascular:     Rate and Rhythm: Normal rate and regular rhythm.  Pulmonary:     Effort: Pulmonary effort is normal.     Breath sounds: Normal breath sounds.  Abdominal:     General: Abdomen is flat.     Palpations: Abdomen is soft.  Musculoskeletal:        General: Normal range of motion.  Skin:    General: Skin is warm and dry.  Neurological:     General: No focal deficit present.     Mental Status: She is alert. Mental status is at baseline.  Psychiatric:        Attention and Perception: She is inattentive.        Mood and Affect: Mood normal. Affect is blunt.        Speech: Speech is delayed.        Behavior: Behavior is slowed.        Thought Content: Thought content normal.    Review of Systems  Constitutional: Negative.   HENT: Negative.    Eyes: Negative.   Respiratory: Negative.    Cardiovascular: Negative.   Gastrointestinal: Negative.   Musculoskeletal: Negative.   Skin: Negative.   Neurological: Negative.   Psychiatric/Behavioral: Negative.     Blood pressure 120/72, pulse (!) 57, temperature 97.9 F (36.6 C), temperature source Oral, resp. rate 18, height 5\' 4"  (1.626 m), weight 40.4 kg, SpO2 100 %. Body mass index is 15.28 kg/m.   Treatment Plan Summary: Medication management and Plan no change to medication management.  Family working on .  Discharge plan still pending.  12/23 No changes  12/24 No changes  12/25 No changes  1/26, MD 07/13/2022, 8:27 AM

## 2022-07-14 DIAGNOSIS — F251 Schizoaffective disorder, depressive type: Secondary | ICD-10-CM | POA: Diagnosis not present

## 2022-07-14 NOTE — Progress Notes (Signed)
Recreation Therapy Notes   Date: 07/14/2022  Time: 10:25 am  Location: Craft room     Behavioral response: [name]   Intervention Topic: Leisure   Discussion/Intervention: Patient refused to attend group.   Clinical Observations/Feedback:  Patient refused to attend group.    [name] LRT/CTRS         Boris Engelmann 07/14/2022 11:26 AM 

## 2022-07-14 NOTE — [facility] Group Notes (Signed)
[facility] Group Notes:  (Nursing/MHT/Case Management/Adjunct)  Date:  07/14/2022  Time:  8:37 PM  Type of Therapy:   Wrap up  Participation Level:  Did Not Attend  Summary of Progress/Problems:  [name] 07/14/2022, 8:37 PM

## 2022-07-14 NOTE — Plan of Care (Signed)
D: Patient alert and oriented. Patient denies pain. Patient denies anxiety and depression. Patient denies SI/HI/AVH. Patient remains isolative to room with exception to coming out for meals and medication.   A: Scheduled medications administered to patient, per MD orders.  Support and encouragement provided to patient.  Q15 minute safety checks maintained.   R: Patient compliant with medication administration and treatment plan. No adverse drug reactions noted. Patient remains safe on the unit at this time. Problem: Education: Goal: Knowledge of General Education information will improve Description: Including pain rating scale, medication(s)/side effects and non-pharmacologic comfort measures Outcome: Progressing   Problem: Clinical Measurements: Goal: Ability to maintain clinical measurements within normal limits will improve Outcome: Progressing   Problem: Education: Goal: Knowledge of [place] [facility] Education information/materials will improve Outcome: Progressing Goal: Verbalization of understanding the information provided will improve Outcome: Progressing   Problem: Health Behavior/Discharge Planning: Goal: Compliance with treatment plan for underlying cause of condition will improve Outcome: Progressing   Problem: Physical Regulation: Goal: Ability to maintain clinical measurements within normal limits will improve Outcome: Progressing   Problem: Safety: Goal: Periods of time without injury will increase Outcome: Progressing

## 2022-07-14 NOTE — [facility] Counselor (Signed)
CSW has resent the requested information to [place] at [place].   Penni Homans, MSW, LCSW 07/14/2022 1:44 PM

## 2022-07-14 NOTE — BH IP Treatment Plan (Signed)
Interdisciplinary Treatment and Diagnostic Plan Update  07/14/2022 Time of Session: 08:30 [name] MRN: [id number]  Principal Diagnosis: Schizoaffective disorder, depressive type [name])  Secondary Diagnoses: Principal Problem:   Schizoaffective disorder, depressive type (HCC)   Current Medications:  Current Facility-Administered Medications  Medication Dose Route Frequency Provider Last Rate Last Admin   acetaminophen (TYLENOL) tablet 650 mg  650 mg Oral Q6H PRN [name], [name], MD       alum & mag hydroxide-simeth (MAALOX/MYLANTA) 200-200-20 MG/5ML suspension 30 mL  30 mL Oral Q4H PRN [name], [name], MD       haloperidol (HALDOL) tablet 5 mg  5 mg Oral BID [name], [name], MD   5 mg at 07/14/22 0165   Or   haloperidol lactate (HALDOL) injection 5 mg  5 mg Intramuscular BID [name], [name], MD       lithium carbonate capsule 600 mg  600 mg Oral QHS [name], [name], MD   600 mg at 07/13/22 2148   LORazepam (ATIVAN) tablet 1 mg  1 mg Oral BID [name], [name], MD   1 mg at 07/14/22 5374   Or   LORazepam (ATIVAN) injection 1 mg  1 mg Intramuscular BID [name], [name], MD       magnesium hydroxide (MILK OF MAGNESIA) suspension 30 mL  30 mL Oral Daily PRN [name], [name], MD       mirtazapine (REMERON) tablet 30 mg  30 mg Oral QHS [name], [name], MD   30 mg at 07/13/22 2148   multivitamin with minerals tablet 1 tablet  1 tablet Oral Daily [name], [name], MD   1 tablet at 07/14/22 8270   QUEtiapine (SEROQUEL) tablet 400 mg  400 mg Oral QHS [name], [name], MD   400 mg at 07/13/22 2148   thiamine (VITAMIN B1) tablet 100 mg  100 mg Oral Daily [name], [name], MD   100 mg at 07/14/22 7867   PTA Medications: Medications Prior to Admission  Medication Sig Dispense Refill Last Dose   clonazePAM (KLONOPIN) 0.5 MG tablet Take 0.5 tablets (0.25 mg total) by mouth 2 (two) times daily. (Patient not taking: Reported on 03/30/2022) 60 tablet 0    haloperidol (HALDOL) 5 MG tablet Take 1 tablet (5 mg  total) by mouth 2 (two) times daily. (Patient not taking: Reported on 03/30/2022) 60 tablet 3    midodrine (PROAMATINE) 2.5 MG tablet Take 1 tablet (2.5 mg total) by mouth 3 (three) times daily with meals. (Patient not taking: Reported on 03/30/2022)  0    mirtazapine (REMERON) 15 MG tablet Take 0.5 tablets (7.5 mg total) by mouth at bedtime. (Patient not taking: Reported on 03/30/2022) 30 tablet 2    Multiple Vitamin (MULTIVITAMIN WITH MINERALS) TABS tablet Take 1 tablet by mouth daily. (Patient not taking: Reported on 03/30/2022)      OLANZapine (ZYPREXA) 5 MG tablet Take 1 tablet (5 mg total) by mouth at bedtime. (Patient not taking: Reported on 03/30/2022) 30 tablet 2    thiamine 100 MG tablet Take 1 tablet (100 mg total) by mouth daily. (Patient not taking: Reported on 03/30/2022)       Patient Stressors: Marital or family conflict    Patient Strengths: Supportive family/friends   Treatment Modalities: Medication Management, Group therapy, Case management,  1 to 1 session with clinician, Psychoeducation, Recreational therapy.   Physician Treatment Plan for Primary Diagnosis: Schizoaffective disorder, depressive type (HCC) Long Term Goal(s): Improvement in symptoms so as ready for  discharge   Short Term Goals: Compliance with prescribed medications will improve Ability to verbalize feelings will improve Ability to demonstrate self-control will improve Ability to identify and develop effective coping behaviors will improve  Medication Management: Evaluate patient's response, side effects, and tolerance of medication regimen.  Therapeutic Interventions: 1 to 1 sessions, Unit Group sessions and Medication administration.  Evaluation of Outcomes: Progressing  Physician Treatment Plan for Secondary Diagnosis: Principal Problem:   Schizoaffective disorder, depressive type (HCC)  Long Term Goal(s): Improvement in symptoms so as ready for discharge   Short Term Goals: Compliance with  prescribed medications will improve Ability to verbalize feelings will improve Ability to demonstrate self-control will improve Ability to identify and develop effective coping behaviors will improve     Medication Management: Evaluate patient's response, side effects, and tolerance of medication regimen.  Therapeutic Interventions: 1 to 1 sessions, Unit Group sessions and Medication administration.  Evaluation of Outcomes: Progressing   RN Treatment Plan for Primary Diagnosis: Schizoaffective disorder, depressive type (HCC) Long Term Goal(s): Knowledge of disease and therapeutic regimen to maintain health will improve  Short Term Goals: Ability to remain free from injury will improve, Ability to verbalize frustration and anger appropriately will improve, Ability to demonstrate self-control, Ability to participate in decision making will improve, Ability to verbalize feelings will improve, Ability to disclose and discuss suicidal ideas, Ability to identify and develop effective coping behaviors will improve, and Compliance with prescribed medications will improve  Medication Management: RN will administer medications as ordered by provider, will assess and evaluate patient's response and provide education to patient for prescribed medication. RN will report any adverse and/or side effects to prescribing provider.  Therapeutic Interventions: 1 on 1 counseling sessions, Psychoeducation, Medication administration, Evaluate responses to treatment, Monitor vital signs and CBGs as ordered, Perform/monitor CIWA, COWS, AIMS and Fall Risk screenings as ordered, Perform wound care treatments as ordered.  Evaluation of Outcomes: Progressing   LCSW Treatment Plan for Primary Diagnosis: Schizoaffective disorder, depressive type (HCC) Long Term Goal(s): Safe transition to appropriate next level of care at discharge, Engage patient in therapeutic group addressing interpersonal concerns.  Short Term  Goals: Engage patient in aftercare planning with referrals and resources, Increase social support, Increase ability to appropriately verbalize feelings, Increase emotional regulation, Facilitate acceptance of mental health diagnosis and concerns, and Increase skills for wellness and recovery  Therapeutic Interventions: Assess for all discharge needs, 1 to 1 time with Social worker, Explore available resources and support systems, Assess for adequacy in community support network, Educate family and significant other(s) on suicide prevention, Complete Psychosocial Assessment, Interpersonal group therapy.  Evaluation of Outcomes: Progressing   Progress in Treatment: Attending groups: No. Participating in groups: No. Taking medication as prescribed: Yes. Toleration medication: Yes. Family/Significant other contact made: Yes, individual(s) contacted:  guardian/sister, [name] Suydam-Lennon. Patient understands diagnosis: No. Discussing patient identified problems/goals with staff: Yes. Medical problems stabilized or resolved: Yes. Denies suicidal/homicidal ideation: Yes. Issues/concerns per patient self-inventory: No. Other: none.  New problem(s) identified: No, Describe:  none  Update 06/29/2022: No changes at this time. Update 07/04/2022: No changes at this time. Update 07/09/22: No changes at this time. Update 07/14/22: No changes at this time.   New Short Term/Long Term Goal(s): Patient to work towards elimination of symptoms of psychosis, medication management for mood stabilization; elimination of SI thoughts; development of comprehensive mental wellness plan.  Update 06/29/2022: No changes at this time. Update 07/04/2022: No changes at this time. Update 07/09/22: No changes at this  time. Update 07/14/22: No changes at this time.   Patient Goals:  No additional goals identified at this time. Patient to continue to work towards original goals identified in initial treatment team meeting. CSW  will remain available to patient should they voice additional treatment goals. Update 06/29/2022: No changes at this time. Update 07/04/2022: No changes at this time. Update 07/09/22: No changes at this time. Update 07/14/22: No changes at this time.   Discharge Plan or Barriers: Patient lacks adequate housing/supervision at discharge. Patient's guardian refuses to pick patient up from hospital out of safety concerns. CSW team continuing to work with legal guardian to attempt to resolve concerns and identify adequate discharge plan. Update 06/29/2022: Guardian is seeking special assistance medicaid.  She has not completed the paperwork at last update.  Patient remains on the unit and safe, however, no plans have been developed for placement at discharge.  Patient lacks funding for group home, and can not afford out of pocket costs associated with Assisted Living. Update 07/04/2022: Currently seeking ALF/Group Home placement. [facility] reviewing for potential placement. Update 07/09/22: No changes at this time. Update 07/14/22: No changes at this time.   Reason for Continuation of Hospitalization: Other; describe Patient is psychiatrically clear. Patient is currently boarding.    Estimated Length of Stay: TBD Update 06/29/2022: TBD Update 07/04/2022: No changes at this time. Update 07/09/22: No changes at this time. Update 07/14/22: No changes at this time.  Last 3 [place] Suicide Severity Risk Score: Flowsheet Row Admission (Current) from 04/08/2022 in [facility] INPATIENT BEHAV[facility]AL MEDICINE Admission (Discharged) from 10/17/2021 in [facility] BE[facility]IORAL MEDICINE Admission (Discharged) from 10/02/2021 in [facility] REGIONAL CARDIAC MED PCU  C-SSRS RISK CATEGORY No Risk No Risk No Risk       Last PHQ 2/9 Scores:     No data to display          Scribe for Treatment Team: Glenis Smoker, LCSW 07/14/2022 11:06 AM

## 2022-07-14 NOTE — Group Note (Signed)
LCSW Group Therapy Note  Group Date: 07/14/2022 Start Time: 1300 End Time: 1400   Type of Therapy and Topic:  Group Therapy - How To Cope with Nervousness about Discharge   Participation Level:  Did Not Attend   Description of Group This process group involved identification of patients' feelings about discharge. Some of them are scheduled to be discharged soon, while others are new admissions, but each of them was asked to share thoughts and feelings surrounding discharge from the hospital. One common theme was that they are excited at the prospect of going home, while another was that many of them are apprehensive about sharing why they were hospitalized. Patients were given the opportunity to discuss these feelings with their peers in preparation for discharge.  Therapeutic Goals  Patient will identify their overall feelings about pending discharge. Patient will think about how they might proactively address issues that they believe will once again arise once they get home (i.e. with parents). Patients will participate in discussion about having hope for change.   Summary of Patient Progress:   Patient declined to attend group, though invited by this clinician.    Therapeutic Modalities Cognitive Behavioral Therapy   Claudie Fisherman 07/14/2022  1:27 PM

## 2022-07-14 NOTE — Progress Notes (Addendum)
[facility] MD Progress Note  07/14/2022 9:16 AM [name]  MRN:  [id number] Subjective:  12/26 The patient seems to be in an irritable mood today.  She has not had any aggression or volatility on the unit.  She gets irritable when asked about her background. "What you're doing to me is B.S." Tells me that the "the governor" is involved with her discharge."  Does not elaborate more.  Sleeping and eating well.  Denies psychosis.  12/25 Patient was seen in her room today, as per her usual in the morning.  No significant events over the past 24 hours.  Says that she does not celebrate Christmas or any other holidays.  When asked if she has kids, tells me that is personal.  She does not have any concerns.  No new medical problems.  She slept well.  Appetite is sufficient, per patient.  Mentions people coming to "vaporizing life... and they just go back."  Apparently the name of one of these people is Hosi.  12/24 Patient reaffirms this morning that she would like to discharge to her own place.  Indicates that she is fully independent, and does not need help.  No new medical problems.  No other significant events in the past 24 hours.  She is taking her medications.  She does not seem to be internally preoccupied throughout the day.  Subjectively denies psychosis, particularly hallucinations.  Denies feeling confused today or having odd/bizarre occurrences on the unit.  Denies new medical problems. Offers limited information this AM.   12/23 Patient has an irritable demeanor today.  She is not aggressive.  Indicates that she was admitted to the hospital for "the wrong reason," states that came here because she was "trespassing on my own property."  Says that she has her own home and has no family.  Does not want to talk about them.  He denies depression and irritability anxiety.  Sleeping and eating well.  Denies symptoms of psychosis as well though she has admitted auditory hallucinations in the past in  addition to feeling people's energy. Tells me that she needs to discharge soon to her own place.   Principal Problem: Schizoaffective disorder, depressive type (HCC) Diagnosis: Principal Problem:   Schizoaffective disorder, depressive type (HCC)  Total Time spent with patient: 25 minutes  Past Psychiatric History: Past history of schizoaffective disorder  Past Medical History:  Past Medical History:  Diagnosis Date   Anemia 10/02/2021   Non compliance w medication regimen    Schizophrenia [facility])     Past Surgical History:  Procedure Laterality Date   BIOPSY  09/25/2021   Procedure: BIOPSY;  Surgeon: Rachael Fee, MD;  Location: [facility];  Service: Gastroenterology;;   ESOPHAGOGASTRODUODENOSCOPY (EGD) WITH PROPOFOL [name] 09/25/2021   Procedure: ESOPHAGOGASTRODUODENOSCOPY (EGD) WITH PROPOFOL;  Surgeon: Rachael Fee, MD;  Location: [facility];  Service: Gastroenterology;  Laterality: [name];  With NGT placement   Family History: History reviewed. No pertinent family history. Family Psychiatric  History: See previous Social History:  Social History   Substance and Sexual Activity  Alcohol Use Not Currently   Comment: Refuses to disclose how much     Social History   Substance and Sexual Activity  Drug Use No   Comment: Refuses to answer    Social History   Socioeconomic History   Marital status: Single    Spouse name: Not on file   Number of children: Not on file   Years of education: Not on file  Highest education level: Not on file  Occupational History   Not on file  Tobacco Use   Smoking status: Every Day    Packs/day: 2.00    Types: Cigarettes   Smokeless tobacco: Never  Substance and Sexual Activity   Alcohol use: Not Currently    Comment: Refuses to disclose how much   Drug use: No    Comment: Refuses to answer   Sexual activity: Not Currently    Comment: refused to answer  Other Topics Concern   Not on file  Social History Narrative   Not on  file   Social Determinants of Health   Financial Resource Strain: Not on file  Food Insecurity: No Food Insecurity (04/08/2022)   Hunger Vital Sign    Worried About Running Out of Food in the Last Year: Never true    Ran Out of Food in the Last Year: Never true  Transportation Needs: No Transportation Needs (04/08/2022)   PRAPARE - Administrator, Civil ServiceTransportation    Lack of Transportation (Medical): No    Lack of Transportation (Non-Medical): No  Physical Activity: Not on file  Stress: Not on file  Social Connections: Not on file   Additional Social History:                         Sleep: Fair  Appetite:  Fair  Current Medications: Current Facility-Administered Medications  Medication Dose Route Frequency Provider Last Rate Last Admin   acetaminophen (TYLENOL) tablet 650 mg  650 mg Oral Q6H PRN [name], [name], MD       alum & mag hydroxide-simeth (MAALOX/MYLANTA) 200-200-20 MG/5ML suspension 30 mL  30 mL Oral Q4H PRN [name], [name], MD       haloperidol (HALDOL) tablet 5 mg  5 mg Oral BID [name], [name], MD   5 mg at 07/14/22 [id number]   Or   haloperidol lactate (HALDOL) injection 5 mg  5 mg Intramuscular BID [name], [name], MD       lithium carbonate capsule 600 mg  600 mg Oral QHS [name], [name], MD   600 mg at 07/13/22 2148   LORazepam (ATIVAN) tablet 1 mg  1 mg Oral BID [name], [name], MD   1 mg at 07/14/22 [id number]   Or   LORazepam (ATIVAN) injection 1 mg  1 mg Intramuscular BID [name], [name], MD       magnesium hydroxide (MILK OF MAGNESIA) suspension 30 mL  30 mL Oral Daily PRN [name], [name], MD       mirtazapine (REMERON) tablet 30 mg  30 mg Oral QHS [name], [name], MD   30 mg at 07/13/22 2148   multivitamin with minerals tablet 1 tablet  1 tablet Oral Daily [name], Jackquline DenmarkJohn T, MD   1 tablet at 07/14/22 [id number]   QUEtiapine (SEROQUEL) tablet 400 mg  400 mg Oral QHS [name], [name], MD   400 mg at 07/13/22 2148   thiamine (VITAMIN B1) tablet 100 mg  100 mg Oral Daily [name], Jackquline DenmarkJohn T,  MD   100 mg at 07/14/22 [id number]    Lab Results: No results found for this or any previous visit (from the past 48 hour(s)).  Blood Alcohol level:  Lab Results  Component Value Date   ETH <10 03/29/2022   ETH <10 09/12/2021    Metabolic Disorder Labs: Lab Results  Component Value Date   HGBA1C 5.6 10/21/2021   MPG 114 10/21/2021   MPG 111  10/02/2021   Lab Results  Component Value Date   PROLACTIN 13.6 04/16/2015   Lab Results  Component Value Date   CHOL 189 10/21/2021   TRIG 52 10/21/2021   HDL 50 10/21/2021   CHOLHDL 3.8 10/21/2021   VLDL 10 10/21/2021   LDLCALC 129 (H) 10/21/2021   LDLCALC 93 10/01/2020    Physical Findings: AIMS: Facial and Oral Movements Muscles of Facial Expression: None, normal Lips and Perioral Area: None, normal Jaw: None, normal Tongue: None, normal,Extremity Movements Upper (arms, wrists, hands, fingers): None, normal Lower (legs, knees, ankles, toes): None, normal, Trunk Movements Neck, shoulders, hips: None, normal, Overall Severity Severity of abnormal movements (highest score from questions above): None, normal Incapacitation due to abnormal movements: None, normal Patient's awareness of abnormal movements (rate only patient's report): No Awareness, Dental Status Current problems with teeth and/or dentures?: No Does patient usually wear dentures?: No  CIWA:    COWS:     Musculoskeletal: Strength & Muscle Tone: within normal limits Gait & Station: normal Patient leans: [name]  Psychiatric Specialty Exam:  Presentation  General Appearance:  Disheveled  Eye Contact: poor  Speech: wnl  Handedness: Right   Mood and Affect  Mood: "I'm fine."  Affect: irritable   Thought Process  Thought Processes: Disorganized; Irrevelant  Descriptions of Associations:Loose  Orientation:Partial  Thought Content:Illogical; Rumination; Scattered; Tangential; Delusions  History of Schizophrenia/Schizoaffective  disorder:Yes  Duration of Psychotic Symptoms:Greater than six months  No psychosis No s/I  Sensorium  Memory: Immediate Fair; Recent Fair; Remote Fair  Judgment: Impaired  Insight: Poor; Lacking   Executive Functions  Concentration: Poor  Attention Span: Poor  Recall: Poor  Fund of Knowledge: Poor  Language: Poor   Psychomotor Activity  Psychomotor Activity:wnl    Physical Exam: Physical Exam Vitals and nursing note reviewed.  Constitutional:      Appearance: Normal appearance.  HENT:     Head: Normocephalic and atraumatic.     Mouth/Throat:     Pharynx: Oropharynx is clear.  Eyes:     Pupils: Pupils are equal, round, and reactive to light.  Cardiovascular:     Rate and Rhythm: Normal rate and regular rhythm.  Pulmonary:     Effort: Pulmonary effort is normal.     Breath sounds: Normal breath sounds.  Abdominal:     General: Abdomen is flat.     Palpations: Abdomen is soft.  Musculoskeletal:        General: Normal range of motion.  Skin:    General: Skin is warm and dry.  Neurological:     General: No focal deficit present.     Mental Status: She is alert. Mental status is at baseline.  Psychiatric:        Attention and Perception: She is inattentive.        Mood and Affect: Mood normal. Affect is blunt.        Speech: Speech is delayed.        Behavior: Behavior is slowed.        Thought Content: Thought content normal.    Review of Systems  Constitutional: Negative.   HENT: Negative.    Eyes: Negative.   Respiratory: Negative.    Cardiovascular: Negative.   Gastrointestinal: Negative.   Musculoskeletal: Negative.   Skin: Negative.   Neurological: Negative.   Psychiatric/Behavioral: Negative.     Blood pressure 121/70, pulse (!) 57, temperature 97.8 F (36.6 C), temperature source Oral, resp. rate 16, height 5\' 4"  (1.626 m), weight 40.4  kg, SpO2 100 %. Body mass index is 15.28 kg/m.   Treatment Plan Summary: Medication  management and Plan no change to medication management.  Family working on [employer].  Discharge plan still pending.  12/23 No changes  12/24 No changes  12/25 No changes  12/26 No changes  Reggie Pile, MD 07/14/2022, 9:16 AM

## 2022-07-15 DIAGNOSIS — F251 Schizoaffective disorder, depressive type: Secondary | ICD-10-CM | POA: Diagnosis not present

## 2022-07-15 NOTE — Plan of Care (Signed)
  Problem: Group Participation Goal: STG - Patient will engage in groups without prompting or encouragement from LRT x3 group sessions within 5 recreation therapy group sessions Description: STG - Patient will engage in groups without prompting or encouragement from LRT x3 group sessions within 5 recreation therapy group sessions Outcome: Not Progressing   

## 2022-07-15 NOTE — Plan of Care (Signed)
Pt continues with isolative behavior, flat affect; denies si hi avh; denies physical distress.  Pt is pleasant and coperative; compliant with medications.  Continued monitoring via Q 15 minute checks.

## 2022-07-15 NOTE — Group Note (Signed)
[facility] LCSW Group Therapy Note   Group Date: 07/15/2022 Start Time: 1300 End Time: 1400   Type of Therapy/Topic:  Group Therapy:  Emotion Regulation  Participation Level:  Did Not Attend   Mood:  Description of Group:    The purpose of this group is to assist patients in learning to regulate negative emotions and experience positive emotions. Patients will be guided to discuss ways in which they have been vulnerable to their negative emotions. These vulnerabilities will be juxtaposed with experiences of positive emotions or situations, and patients challenged to use positive emotions to combat negative ones. Special emphasis will be placed on coping with negative emotions in conflict situations, and patients will process healthy conflict resolution skills.  Therapeutic Goals: Patient will identify two positive emotions or experiences to reflect on in order to balance out negative emotions:  Patient will label two or more emotions that they find the most difficult to experience:  Patient will be able to demonstrate positive conflict resolution skills through discussion or role plays:   Summary of Patient Progress: Pt declined to attend group despite invitation.    Therapeutic Modalities:   Cognitive Behavioral Therapy Feelings Identification Dialectical Behavioral Therapy   Glenis Smoker, LCSW

## 2022-07-15 NOTE — Plan of Care (Signed)
D- Patient alert and oriented. Patient presented in a pleasant mood on assessment reporting that she slept good last night and had no complaints to voice to this Clinical research associate. Patient denied SI, HI, AVH, and pain at this time. Patient also denied any signs/symptoms of depression and anxiety. Patient's reported goal, per her self-inventory, again is to discharge.  A- Scheduled medications administered to patient, per MD orders. Support and encouragement provided.  Routine safety checks conducted every 15 minutes.  Patient informed to notify staff with problems or concerns.  R- No adverse drug reactions noted. Patient contracts for safety at this time. Patient compliant with medications. Patient receptive, calm, and cooperative. Patient remains isolative to room, except for meals and medication. However, she does come out throughout the day and sit in the dayroom, with other members on the unit, and watches television without any issues. Patient remains safe at this time.  Problem: Education: Goal: Knowledge of General Education information will improve Description: Including pain rating scale, medication(s)/side effects and non-pharmacologic comfort measures Outcome: Progressing   Problem: Health Behavior/Discharge Planning: Goal: Ability to manage health-related needs will improve Outcome: Progressing   Problem: Clinical Measurements: Goal: Ability to maintain clinical measurements within normal limits will improve Outcome: Progressing Goal: Will remain free from infection Outcome: Progressing Goal: Diagnostic test results will improve Outcome: Progressing Goal: Respiratory complications will improve Outcome: Progressing Goal: Cardiovascular complication will be avoided Outcome: Progressing   Problem: Activity: Goal: Risk for activity intolerance will decrease Outcome: Progressing   Problem: Nutrition: Goal: Adequate nutrition will be maintained Outcome: Progressing   Problem:  Coping: Goal: Level of anxiety will decrease Outcome: Progressing   Problem: Elimination: Goal: Will not experience complications related to bowel motility Outcome: Progressing Goal: Will not experience complications related to urinary retention Outcome: Progressing   Problem: Pain Managment: Goal: General experience of comfort will improve Outcome: Progressing   Problem: Safety: Goal: Ability to remain free from injury will improve Outcome: Progressing   Problem: Skin Integrity: Goal: Risk for impaired skin integrity will decrease Outcome: Progressing   Problem: Activity: Goal: Will verbalize the importance of balancing activity with adequate rest periods Outcome: Progressing   Problem: Education: Goal: Will be free of psychotic symptoms Outcome: Progressing Goal: Knowledge of the prescribed therapeutic regimen will improve Outcome: Progressing   Problem: Coping: Goal: Coping ability will improve Outcome: Progressing Goal: Will verbalize feelings Outcome: Progressing   Problem: Health Behavior/Discharge Planning: Goal: Compliance with prescribed medication regimen will improve Outcome: Progressing   Problem: Nutritional: Goal: Ability to achieve adequate nutritional intake will improve Outcome: Progressing   Problem: Role Relationship: Goal: Ability to communicate needs accurately will improve Outcome: Progressing Goal: Ability to interact with others will improve Outcome: Progressing   Problem: Safety: Goal: Ability to redirect hostility and anger into socially appropriate behaviors will improve Outcome: Progressing Goal: Ability to remain free from injury will improve Outcome: Progressing   Problem: Self-Care: Goal: Ability to participate in self-care as condition permits will improve Outcome: Progressing   Problem: Self-Concept: Goal: Will verbalize positive feelings about self Outcome: Progressing   Problem: Education: Goal: Knowledge of [facility] General Education information/materials will improve Outcome: Progressing Goal: Emotional status will improve Outcome: Progressing Goal: Mental status will improve Outcome: Progressing Goal: Verbalization of understanding the information provided will improve Outcome: Progressing   Problem: Activity: Goal: Interest or engagement in activities will improve Outcome: Progressing Goal: Sleeping patterns will improve Outcome: Progressing   Problem: Coping: Goal: Ability to verbalize frustrations and anger  appropriately will improve Outcome: Progressing Goal: Ability to demonstrate self-control will improve Outcome: Progressing   Problem: Health Behavior/Discharge Planning: Goal: Identification of resources available to assist in meeting health care needs will improve Outcome: Progressing Goal: Compliance with treatment plan for underlying cause of condition will improve Outcome: Progressing   Problem: Physical Regulation: Goal: Ability to maintain clinical measurements within normal limits will improve Outcome: Progressing   Problem: Safety: Goal: Periods of time without injury will increase Outcome: Progressing

## 2022-07-15 NOTE — Progress Notes (Signed)
Recreation Therapy Notes      Date: 07/15/2022   Time: 10:25 am   Location: Craft room      Behavioral response: [name]   Intervention Topic: Values    Discussion/Intervention: Patient refused to attend group.    Clinical Observations/Feedback:  Patient refused to attend group.    Daesean Lazarz LRT/CTRS        Tazia Illescas 07/15/2022 11:04 AM

## 2022-07-15 NOTE — Progress Notes (Signed)
[facility] MD Progress Note  07/15/2022 11:46 AM [name]  MRN:  [id number] Subjective:  12/27 Patient provides a dearth of information, she makes poor eye contact and tells me that she is fine.  Still believes that the Fabio Pierce is involved with her discharge process.  Remarks "I just want to go home."  Irritably replies no when asked about symptoms of psychosis.  No safety concerns.  Taking her medications as prescribed.  Slept well.  Appetite is adequate.  12/26 The patient seems to be in an irritable mood today.  She has not had any aggression or volatility on the unit.  She gets irritable when asked about her background. "What you're doing to me is B.S." Tells me that the "the governor" is involved with her discharge."  Does not elaborate more.  Sleeping and eating well.  Denies psychosis.  12/25 Patient was seen in her room today, as per her usual in the morning.  No significant events over the past 24 hours.  Says that she does not celebrate Christmas or any other holidays.  When asked if she has kids, tells me that is personal.  She does not have any concerns.  No new medical problems.  She slept well.  Appetite is sufficient, per patient.  Mentions people coming to "vaporizing life... and they just go back."  Apparently the name of one of these people is Hosi.  12/24 Patient reaffirms this morning that she would like to discharge to her own place.  Indicates that she is fully independent, and does not need help.  No new medical problems.  No other significant events in the past 24 hours.  She is taking her medications.  She does not seem to be internally preoccupied throughout the day.  Subjectively denies psychosis, particularly hallucinations.  Denies feeling confused today or having odd/bizarre occurrences on the unit.  Denies new medical problems. Offers limited information this AM.   12/23 Patient has an irritable demeanor today.  She is not aggressive.  Indicates that she was admitted to  the hospital for "the wrong reason," states that came here because she was "trespassing on my own property."  Says that she has her own home and has no family.  Does not want to talk about them.  He denies depression and irritability anxiety.  Sleeping and eating well.  Denies symptoms of psychosis as well though she has admitted auditory hallucinations in the past in addition to feeling people's energy. Tells me that she needs to discharge soon to her own place.   Principal Problem: Schizoaffective disorder, depressive type (HCC) Diagnosis: Principal Problem:   Schizoaffective disorder, depressive type (HCC)  Total Time spent with patient: 25 minutes  Past Psychiatric History: Past history of schizoaffective disorder  Past Medical History:  Past Medical History:  Diagnosis Date   Anemia 10/02/2021   Non compliance w medication regimen    Schizophrenia [facility])     Past Surgical History:  Procedure Laterality Date   BIOPSY  09/25/2021   Procedure: BIOPSY;  Surgeon: Rachael Fee, MD;  Location: [facility];  Service: Gastroenterology;;   ESOPHAGOGASTRODUODENOSCOPY (EGD) WITH PROPOFOL [name] 09/25/2021   Procedure: ESOPHAGOGASTRODUODENOSCOPY (EGD) WITH PROPOFOL;  Surgeon: Rachael Fee, MD;  Location: [facility];  Service: Gastroenterology;  Laterality: [name];  With NGT placement   Family History: History reviewed. No pertinent family history. Family Psychiatric  History: See previous Social History:  Social History   Substance and Sexual Activity  Alcohol Use Not Currently  Comment: Refuses to disclose how much     Social History   Substance and Sexual Activity  Drug Use No   Comment: Refuses to answer    Social History   Socioeconomic History   Marital status: Single    Spouse name: Not on file   Number of children: Not on file   Years of education: Not on file   Highest education level: Not on file  Occupational History   Not on file  Tobacco Use   Smoking status:  Every Day    Packs/day: 2.00    Types: Cigarettes   Smokeless tobacco: Never  Substance and Sexual Activity   Alcohol use: Not Currently    Comment: Refuses to disclose how much   Drug use: No    Comment: Refuses to answer   Sexual activity: Not Currently    Comment: refused to answer  Other Topics Concern   Not on file  Social History Narrative   Not on file   Social Determinants of Health   Financial Resource Strain: Not on file  Food Insecurity: No Food Insecurity (04/08/2022)   Hunger Vital Sign    Worried About Running Out of Food in the Last Year: Never true    Ran Out of Food in the Last Year: Never true  Transportation Needs: No Transportation Needs (04/08/2022)   PRAPARE - Administrator, Civil Service (Medical): No    Lack of Transportation (Non-Medical): No  Physical Activity: Not on file  Stress: Not on file  Social Connections: Not on file   Additional Social History:                         Sleep: Fair  Appetite:  Fair  Current Medications: Current Facility-Administered Medications  Medication Dose Route Frequency Provider Last Rate Last Admin   acetaminophen (TYLENOL) tablet 650 mg  650 mg Oral Q6H PRN [name], [name], MD       alum & mag hydroxide-simeth (MAALOX/MYLANTA) 200-200-20 MG/5ML suspension 30 mL  30 mL Oral Q4H PRN [name], [name], MD       haloperidol (HALDOL) tablet 5 mg  5 mg Oral BID [name], [name], MD   5 mg at 07/15/22 2536   Or   haloperidol lactate (HALDOL) injection 5 mg  5 mg Intramuscular BID [name], [name], MD       lithium carbonate capsule 600 mg  600 mg Oral QHS [name], [name], MD   600 mg at 07/14/22 2119   LORazepam (ATIVAN) tablet 1 mg  1 mg Oral BID [name], [name], MD   1 mg at 07/15/22 0801   Or   LORazepam (ATIVAN) injection 1 mg  1 mg Intramuscular BID [name], [name], MD       magnesium hydroxide (MILK OF MAGNESIA) suspension 30 mL  30 mL Oral Daily PRN [name], [name], MD       mirtazapine  (REMERON) tablet 30 mg  30 mg Oral QHS [name], [name], MD   30 mg at 07/14/22 2119   multivitamin with minerals tablet 1 tablet  1 tablet Oral Daily [name], [name], MD   1 tablet at 07/15/22 0801   QUEtiapine (SEROQUEL) tablet 400 mg  400 mg Oral QHS [name], [name], MD   400 mg at 07/14/22 2119   thiamine (VITAMIN B1) tablet 100 mg  100 mg Oral Daily [name], [name], MD   100 mg at  07/15/22 0801    Lab Results: No results found for this or any previous visit (from the past 48 hour(s)).  Blood Alcohol level:  Lab Results  Component Value Date   ETH <10 03/29/2022   ETH <10 09/12/2021    Metabolic Disorder Labs: Lab Results  Component Value Date   HGBA1C 5.6 10/21/2021   MPG 114 10/21/2021   MPG 111 10/02/2021   Lab Results  Component Value Date   PROLACTIN 13.6 04/16/2015   Lab Results  Component Value Date   CHOL 189 10/21/2021   TRIG 52 10/21/2021   HDL 50 10/21/2021   CHOLHDL 3.8 10/21/2021   VLDL 10 10/21/2021   LDLCALC 129 (H) 10/21/2021   LDLCALC 93 10/01/2020    Physical Findings: AIMS: Facial and Oral Movements Muscles of Facial Expression: None, normal Lips and Perioral Area: None, normal Jaw: None, normal Tongue: None, normal,Extremity Movements Upper (arms, wrists, hands, fingers): None, normal Lower (legs, knees, ankles, toes): None, normal, Trunk Movements Neck, shoulders, hips: None, normal, Overall Severity Severity of abnormal movements (highest score from questions above): None, normal Incapacitation due to abnormal movements: None, normal Patient's awareness of abnormal movements (rate only patient's report): No Awareness, Dental Status Current problems with teeth and/or dentures?: No Does patient usually wear dentures?: No  CIWA:    COWS:     Musculoskeletal: Strength & Muscle Tone: within normal limits Gait & Station: normal Patient leans: [name]  Psychiatric Specialty Exam:  Presentation  General Appearance:  Disheveled  Eye  Contact: poor  Speech: wnl  Handedness: Right   Mood and Affect  Mood: "fine."  Affect: irritable   Thought Process  Thought Processes: Disorganized; Irrevelant  Descriptions of Associations:Loose  Orientation: yes Thought Content:Illogical; Rumination; Scattered; Tangential; Delusions  History of Schizophrenia/Schizoaffective disorder:Yes  Duration of Psychotic Symptoms:Greater than six months  No psychosis No s/I  Sensorium  Memory: Immediate Fair; Recent Fair; Remote Fair  Judgment: Impaired  Insight: Poor; Lacking   Executive Functions  Concentration: Poor  Attention Span: Poor  Recall: Poor  Fund of Knowledge: Poor  Language: Poor   Psychomotor Activity  Psychomotor Activity:wnl    Physical Exam: Physical Exam Vitals and nursing note reviewed.  Constitutional:      Appearance: Normal appearance.  HENT:     Head: Normocephalic and atraumatic.     Mouth/Throat:     Pharynx: Oropharynx is clear.  Eyes:     Pupils: Pupils are equal, round, and reactive to light.  Cardiovascular:     Rate and Rhythm: Normal rate and regular rhythm.  Pulmonary:     Effort: Pulmonary effort is normal.     Breath sounds: Normal breath sounds.  Abdominal:     General: Abdomen is flat.     Palpations: Abdomen is soft.  Musculoskeletal:        General: Normal range of motion.  Skin:    General: Skin is warm and dry.  Neurological:     General: No focal deficit present.     Mental Status: She is alert. Mental status is at baseline.  Psychiatric:        Attention and Perception: She is inattentive.        Mood and Affect: Mood normal. Affect is blunt.        Speech: Speech is delayed.        Behavior: Behavior is slowed.        Thought Content: Thought content normal.    Review of Systems  Constitutional:  Negative.   HENT: Negative.    Eyes: Negative.   Respiratory: Negative.    Cardiovascular: Negative.   Gastrointestinal: Negative.    Musculoskeletal: Negative.   Skin: Negative.   Neurological: Negative.   Psychiatric/Behavioral: Negative.     Blood pressure 105/65, pulse 72, temperature 98.3 F (36.8 C), temperature source Oral, resp. rate 16, height 5\' 4"  (1.626 m), weight 40.4 kg, SpO2 98 %. Body mass index is 15.28 kg/m.   Treatment Plan Summary: Medication management and Plan no change to medication management.  Family working on [employer].  Discharge plan still pending.  12/23 No changes  12/24 No changes  12/25 No changes  12/26 No changes  12/27 No changes  Paula PileAnand Maxmillian Carsey, MD 07/15/2022, 11:46 AM

## 2022-07-16 DIAGNOSIS — F251 Schizoaffective disorder, depressive type: Secondary | ICD-10-CM | POA: Diagnosis not present

## 2022-07-16 LAB — RESP PANEL BY RT-PCR (RSV, FLU A&B, COVID)  RVPGX2
Influenza A by PCR: NEGATIVE
Influenza B by PCR: NEGATIVE
Resp Syncytial Virus by PCR: NEGATIVE
SARS Coronavirus 2 by RT PCR: NEGATIVE

## 2022-07-16 NOTE — Progress Notes (Signed)
Patient alert and oriented, able to answer questions coherently. Denies SI, HI, AVH.  Out of room for meds and snack this shift otherwise isolative to room and self. No interaction with peers minimal with staff.  Encouragement and support provided. Safety checks maintained. Medications given as prescribed. Pt receptive and remains safe on unit with q 15 min checks.

## 2022-07-16 NOTE — Plan of Care (Signed)
  Problem: Health Behavior/Discharge Planning: Goal: Ability to manage health-related needs will improve Outcome: Progressing   Problem: Nutrition: Goal: Adequate nutrition will be maintained Outcome: Progressing   Problem: Coping: Goal: Level of anxiety will decrease Outcome: Progressing   Problem: Pain Managment: Goal: General experience of comfort will improve Outcome: Progressing   Problem: Safety: Goal: Ability to remain free from injury will improve Outcome: Progressing   

## 2022-07-16 NOTE — Plan of Care (Signed)
Pt is calm and cooperative, isolative as per baseline  Pt denies depression anxiety and hopelessness; however, affect is flat.  Denies SI HI AVH.  Denies physical symptoms.  Continues to state that her goal is "discharge."  Continued monitoring via q 15 minute checks.

## 2022-07-16 NOTE — Progress Notes (Signed)
[facility] MD Progress Note  07/16/2022 4:26 PM [name]  MRN:  [id number] Subjective: Follow-up 61 year old woman with schizoaffective disorder.  Remains disorganized and paranoid.  Unable to engage in conversation about appropriate discharge planning.  No violent behavior.  Taking care of ADLs adequately. Principal Problem: Schizoaffective disorder, depressive type (HCC) Diagnosis: Principal Problem:   Schizoaffective disorder, depressive type (HCC)  Total Time spent with patient: 30 minutes  Past Psychiatric History: Past history of schizoaffective disorder  Past Medical History:  Past Medical History:  Diagnosis Date   Anemia 10/02/2021   Non compliance w medication regimen    Schizophrenia [facility])     Past Surgical History:  Procedure Laterality Date   BIOPSY  09/25/2021   Procedure: BIOPSY;  Surgeon: Rachael Fee, MD;  Location: [facility];  Service: Gastroenterology;;   ESOPHAGOGASTRODUODENOSCOPY (EGD) WITH PROPOFOL [name] 09/25/2021   Procedure: ESOPHAGOGASTRODUODENOSCOPY (EGD) WITH PROPOFOL;  Surgeon: Rachael Fee, MD;  Location: [facility];  Service: Gastroenterology;  Laterality: [name];  With NGT placement   Family History: History reviewed. No pertinent family history. Family Psychiatric  History: See previous Social History:  Social History   Substance and Sexual Activity  Alcohol Use Not Currently   Comment: Refuses to disclose how much     Social History   Substance and Sexual Activity  Drug Use No   Comment: Refuses to answer    Social History   Socioeconomic History   Marital status: Single    Spouse name: Not on file   Number of children: Not on file   Years of education: Not on file   Highest education level: Not on file  Occupational History   Not on file  Tobacco Use   Smoking status: Every Day    Packs/day: 2.00    Types: Cigarettes   Smokeless tobacco: Never  Substance and Sexual Activity   Alcohol use: Not Currently    Comment: Refuses to  disclose how much   Drug use: No    Comment: Refuses to answer   Sexual activity: Not Currently    Comment: refused to answer  Other Topics Concern   Not on file  Social History Narrative   Not on file   Social Determinants of Health   Financial Resource Strain: Not on file  Food Insecurity: No Food Insecurity (04/08/2022)   Hunger Vital Sign    Worried About Running Out of Food in the Last Year: Never true    Ran Out of Food in the Last Year: Never true  Transportation Needs: No Transportation Needs (04/08/2022)   PRAPARE - Administrator, Civil Service (Medical): No    Lack of Transportation (Non-Medical): No  Physical Activity: Not on file  Stress: Not on file  Social Connections: Not on file   Additional Social History:                         Sleep: Fair  Appetite:  Fair  Current Medications: Current Facility-Administered Medications  Medication Dose Route Frequency Provider Last Rate Last Admin   acetaminophen (TYLENOL) tablet 650 mg  650 mg Oral Q6H PRN Beckham Capistran T, MD       alum & mag hydroxide-simeth (MAALOX/MYLANTA) 200-200-20 MG/5ML suspension 30 mL  30 mL Oral Q4H PRN Riyansh Gerstner T, MD       haloperidol (HALDOL) tablet 5 mg  5 mg Oral BID Brisia Schuermann, [name], MD   5 mg at 07/16/22 [phone]  Or   haloperidol lactate (HALDOL) injection 5 mg  5 mg Intramuscular BID Kelwin Gibler T, MD       lithium carbonate capsule 600 mg  600 mg Oral QHS Treshaun Carrico T, MD   600 mg at 07/15/22 2032   LORazepam (ATIVAN) tablet 1 mg  1 mg Oral BID Shailah Gibbins T, MD   1 mg at 07/16/22 7035   Or   LORazepam (ATIVAN) injection 1 mg  1 mg Intramuscular BID Margeart Allender T, MD       magnesium hydroxide (MILK OF MAGNESIA) suspension 30 mL  30 mL Oral Daily PRN Derian Pfost T, MD       mirtazapine (REMERON) tablet 30 mg  30 mg Oral QHS Jaylynn Siefert T, MD   30 mg at 07/15/22 2032   multivitamin with minerals tablet 1 tablet  1 tablet Oral Daily Melaya Hoselton, [name], MD    1 tablet at 07/16/22 0804   QUEtiapine (SEROQUEL) tablet 400 mg  400 mg Oral QHS Donnella Morford T, MD   400 mg at 07/15/22 2032   thiamine (VITAMIN B1) tablet 100 mg  100 mg Oral Daily Antoniette Peake, [name], MD   100 mg at 07/16/22 0093    Lab Results:  Results for orders placed or performed during the hospital encounter of 04/08/22 (from the past 48 hour(s))  Resp panel by RT-PCR (RSV, Flu A&B, Covid) Anterior Nasal Swab     Status: None   Collection Time: 07/16/22 11:11 AM   Specimen: Anterior Nasal Swab  Result Value Ref Range   SARS Coronavirus 2 by RT PCR NEGATIVE NEGATIVE    Comment: (NOTE) SARS-CoV-2 target nucleic acids are NOT DETECTED.  The SARS-CoV-2 RNA is generally detectable in upper respiratory specimens during the acute phase of infection. The lowest concentration of SARS-CoV-2 viral copies this assay can detect is 138 copies/mL. A negative result does not preclude SARS-Cov-2 infection and should not be used as the sole basis for treatment or other patient management decisions. A negative result may occur with  improper specimen collection/handling, submission of specimen other than nasopharyngeal swab, presence of viral mutation(s) within the areas targeted by this assay, and inadequate number of viral copies(<138 copies/mL). A negative result must be combined with clinical observations, patient history, and epidemiological information. The expected result is Negative.  Fact Sheet for Patients:  BloggerCourse.com  Fact Sheet for Healthcare Providers:  SeriousBroker.it  This test is no t yet approved or cleared by the [place] FDA and  has been authorized for detection and/or diagnosis of SARS-CoV-2 by FDA under an Emergency Use Authorization (EUA). This EUA will remain  in effect (meaning this test can be used) for the duration of the COVID-19 declaration under Section 564(b)(1) of the Act, 21 U.S.C.section  360bbb-3(b)(1), unless the authorization is terminated  or revoked sooner.       Influenza A by PCR NEGATIVE NEGATIVE   Influenza B by PCR NEGATIVE NEGATIVE    Comment: (NOTE) The Xpert Xpress SARS-CoV-2/FLU/RSV plus assay is intended as an aid in the diagnosis of influenza from Nasopharyngeal swab specimens and should not be used as a sole basis for treatment. Nasal washings and aspirates are unacceptable for Xpert Xpress SARS-CoV-2/FLU/RSV testing.  Fact Sheet for Patients: BloggerCourse.com  Fact Sheet for Healthcare Providers: SeriousBroker.it  This test is not yet approved or cleared by the [place] FDA and has been authorized for detection and/or diagnosis of SARS-CoV-2 by FDA under an Emergency Use Authorization (EUA).  This EUA will remain in effect (meaning this test can be used) for the duration of the COVID-19 declaration under Section 564(b)(1) of the Act, 21 U.S.C. section 360bbb-3(b)(1), unless the authorization is terminated or revoked.     Resp Syncytial Virus by PCR NEGATIVE NEGATIVE    Comment: (NOTE) Fact Sheet for Patients: BloggerCourse.com  Fact Sheet for Healthcare Providers: SeriousBroker.it  This test is not yet approved or cleared by the [place] FDA and has been authorized for detection and/or diagnosis of SARS-CoV-2 by FDA under an Emergency Use Authorization (EUA). This EUA will remain in effect (meaning this test can be used) for the duration of the COVID-19 declaration under Section 564(b)(1) of the Act, 21 U.S.C. section 360bbb-3(b)(1), unless the authorization is terminated or revoked.  Performed at [facility], [address] [place], [place] [address]     Blood Alcohol level:  Lab Results  Component Value Date   [facility] <10 03/29/2022   ETH <10 09/12/2021    Metabolic Disorder Labs: Lab Results  Component Value  Date   HGBA1C 5.6 10/21/2021   MPG 114 10/21/2021   MPG 111 10/02/2021   Lab Results  Component Value Date   PROLACTIN 13.6 04/16/2015   Lab Results  Component Value Date   CHOL 189 10/21/2021   TRIG 52 10/21/2021   HDL 50 10/21/2021   CHOLHDL 3.8 10/21/2021   VLDL 10 10/21/2021   LDLCALC 129 (H) 10/21/2021   LDLCALC 93 10/01/2020    Physical Findings: AIMS: Facial and Oral Movements Muscles of Facial Expression: None, normal Lips and Perioral Area: None, normal Jaw: None, normal Tongue: None, normal,Extremity Movements Upper (arms, wrists, hands, fingers): None, normal Lower (legs, knees, ankles, toes): None, normal, Trunk Movements Neck, shoulders, hips: None, normal, Overall Severity Severity of abnormal movements (highest score from questions above): None, normal Incapacitation due to abnormal movements: None, normal Patient's awareness of abnormal movements (rate only patient's report): No Awareness, Dental Status Current problems with teeth and/or dentures?: No Does patient usually wear dentures?: No  CIWA:    COWS:     Musculoskeletal: Strength & Muscle Tone: within normal limits Gait & Station: normal Patient leans: [name]  Psychiatric Specialty Exam:  Presentation  General Appearance:  Disheveled  Eye Contact: Fair  Speech: Pressured  Speech Volume: Increased  Handedness: Right   Mood and Affect  Mood: Irritable; Labile; Angry  Affect: Blunt; Labile   Thought Process  Thought Processes: Disorganized; Irrevelant  Descriptions of Associations:Loose  Orientation:Partial  Thought Content:Illogical; Rumination; Scattered; Tangential; Delusions  History of Schizophrenia/Schizoaffective disorder:Yes  Duration of Psychotic Symptoms:Greater than six months  Hallucinations:No data recorded Ideas of Reference:Percusatory; Paranoia  Suicidal Thoughts:No data recorded Homicidal Thoughts:No data recorded  Sensorium  Memory: Immediate  Fair; Recent Fair; Remote Fair  Judgment: Impaired  Insight: Poor; Lacking   Executive Functions  Concentration: Poor  Attention Span: Poor  Recall: Poor  Fund of Knowledge: Poor  Language: Poor   Psychomotor Activity  Psychomotor Activity:No data recorded  Assets  Assets: Physical Health; Resilience; Housing; Social Support   Sleep  Sleep:No data recorded   Physical Exam: Physical Exam Vitals reviewed.  Constitutional:      Appearance: Normal appearance.  HENT:     Head: Normocephalic and atraumatic.     Mouth/Throat:     Pharynx: Oropharynx is clear.  Eyes:     Pupils: Pupils are equal, round, and reactive to light.  Cardiovascular:     Rate and Rhythm: Normal rate and  regular rhythm.  Pulmonary:     Effort: Pulmonary effort is normal.     Breath sounds: Normal breath sounds.  Abdominal:     General: Abdomen is flat.     Palpations: Abdomen is soft.  Musculoskeletal:        General: Normal range of motion.  Skin:    General: Skin is warm and dry.  Neurological:     General: No focal deficit present.     Mental Status: She is alert. Mental status is at baseline.  Psychiatric:        Attention and Perception: She is inattentive.        Mood and Affect: Mood normal. Affect is blunt.        Speech: Speech is delayed.        Behavior: Behavior is slowed.        Thought Content: Thought content is paranoid and delusional.    Review of Systems  Constitutional: Negative.   HENT: Negative.    Eyes: Negative.   Respiratory: Negative.    Cardiovascular: Negative.   Gastrointestinal: Negative.   Musculoskeletal: Negative.   Skin: Negative.   Neurological: Negative.   Psychiatric/Behavioral: Negative.     Blood pressure 119/70, pulse (!) 57, temperature 98.2 F (36.8 C), temperature source Oral, resp. rate 16, height 5\' 4"  (1.626 m), weight 40.4 kg, SpO2 100 %. Body mass index is 15.28 kg/m.   Treatment Plan Summary: Plan no change to  current medication.  Once again tried to encourage her to interact with others.  Tried to broach the topic of appropriate discharge planning but she gets immediately agitated when I discussed her family.  Mordecai RasmussenJohn Terrea Bruster, MD 07/16/2022, 4:26 PM

## 2022-07-16 NOTE — Progress Notes (Signed)
Recreation Therapy Notes    Date: 07/16/2022  Time: 10:00 am  Location: Craft room     Behavioral response: [name]   Intervention Topic: Coping Skills   Discussion/Intervention: Patient refused to attend group.   Clinical Observations/Feedback:  Patient refused to attend group.    Lakota Schweppe LRT/CTRS        Lemmie Steinhaus 07/16/2022 11:17 AM

## 2022-07-16 NOTE — Group Note (Signed)
[facility] LCSW Group Therapy Note    Group Date: 07/16/2022 Start Time: 1300 End Time: 1400  Type of Therapy and Topic:  Group Therapy:  Overcoming Obstacles  Participation Level:  [facility] PARTICIPATION LEVEL: Did Not Attend  Mood:  Description of Group:   In this group patients will be encouraged to explore what they see as obstacles to their own wellness and recovery. They will be guided to discuss their thoughts, feelings, and behaviors related to these obstacles. The group will process together ways to cope with barriers, with attention given to specific choices patients can make. Each patient will be challenged to identify changes they are motivated to make in order to overcome their obstacles. This group will be process-oriented, with patients participating in exploration of their own experiences as well as giving and receiving support and challenge from other group members.  Therapeutic Goals: 1. Patient will identify personal and current obstacles as they relate to admission. 2. Patient will identify barriers that currently interfere with their wellness or overcoming obstacles.  3. Patient will identify feelings, thought process and behaviors related to these barriers. 4. Patient will identify two changes they are willing to make to overcome these obstacles:    Summary of Patient Progress   Patient declined to attend group despite being encouraged to do so by this clinician.    Therapeutic Modalities:   Cognitive Behavioral Therapy Solution Focused Therapy Motivational Interviewing Relapse Prevention Therapy   Jenel Gierke J Dafina Suk, LCSW 

## 2022-07-17 DIAGNOSIS — F251 Schizoaffective disorder, depressive type: Secondary | ICD-10-CM | POA: Diagnosis not present

## 2022-07-17 NOTE — Progress Notes (Signed)
Unremarkable night, minimal interaction with staff and peers. Denies SI, HI, AVH. Medication compliant. Appropriate with staff and peers. Encouragement and support provided. Safety checks maintained. Medications given as prescribed, pt receptive and remains safe on unit with q 15 min checks.

## 2022-07-17 NOTE — Progress Notes (Signed)
[facility] MD Progress Note  07/17/2022 1:37 PM [name]  MRN:  [id number] Subjective: No change to presentation.  No new complaints. Principal Problem: Schizoaffective disorder, depressive type (HCC) Diagnosis: Principal Problem:   Schizoaffective disorder, depressive type (HCC)  Total Time spent with patient: 30 minutes  Past Psychiatric History: Past history of schizophrenia  Past Medical History:  Past Medical History:  Diagnosis Date   Anemia 10/02/2021   Non compliance w medication regimen    Schizophrenia [facility])     Past Surgical History:  Procedure Laterality Date   BIOPSY  09/25/2021   Procedure: BIOPSY;  Surgeon: Rachael Fee, MD;  Location: [facility];  Service: Gastroenterology;;   ESOPHAGOGASTRODUODENOSCOPY (EGD) WITH PROPOFOL [name] 09/25/2021   Procedure: ESOPHAGOGASTRODUODENOSCOPY (EGD) WITH PROPOFOL;  Surgeon: Rachael Fee, MD;  Location: [facility];  Service: Gastroenterology;  Laterality: [name];  With NGT placement   Family History: History reviewed. No pertinent family history. Family Psychiatric  History: See previous Social History:  Social History   Substance and Sexual Activity  Alcohol Use Not Currently   Comment: Refuses to disclose how much     Social History   Substance and Sexual Activity  Drug Use No   Comment: Refuses to answer    Social History   Socioeconomic History   Marital status: Single    Spouse name: Not on file   Number of children: Not on file   Years of education: Not on file   Highest education level: Not on file  Occupational History   Not on file  Tobacco Use   Smoking status: Every Day    Packs/day: 2.00    Types: Cigarettes   Smokeless tobacco: Never  Substance and Sexual Activity   Alcohol use: Not Currently    Comment: Refuses to disclose how much   Drug use: No    Comment: Refuses to answer   Sexual activity: Not Currently    Comment: refused to answer  Other Topics Concern   Not on file  Social History  Narrative   Not on file   Social Determinants of Health   Financial Resource Strain: Not on file  Food Insecurity: No Food Insecurity (04/08/2022)   Hunger Vital Sign    Worried About Running Out of Food in the Last Year: Never true    Ran Out of Food in the Last Year: Never true  Transportation Needs: No Transportation Needs (04/08/2022)   PRAPARE - Administrator, Civil Service (Medical): No    Lack of Transportation (Non-Medical): No  Physical Activity: Not on file  Stress: Not on file  Social Connections: Not on file   Additional Social History:                         Sleep: Fair  Appetite:  Fair  Current Medications: Current Facility-Administered Medications  Medication Dose Route Frequency Provider Last Rate Last Admin   acetaminophen (TYLENOL) tablet 650 mg  650 mg Oral Q6H PRN Kalmen Lollar T, MD       alum & mag hydroxide-simeth (MAALOX/MYLANTA) 200-200-20 MG/5ML suspension 30 mL  30 mL Oral Q4H PRN Nichlos Kunzler T, MD       haloperidol (HALDOL) tablet 5 mg  5 mg Oral BID Jenisse Vullo, [name], MD   5 mg at 07/17/22 6294   Or   haloperidol lactate (HALDOL) injection 5 mg  5 mg Intramuscular BID Belinda Bringhurst, [name], MD  lithium carbonate capsule 600 mg  600 mg Oral QHS Jahkari Maclin T, MD   600 mg at 07/16/22 2126   LORazepam (ATIVAN) tablet 1 mg  1 mg Oral BID Zael Shuman T, MD   1 mg at 07/17/22 [id number]   Or   LORazepam (ATIVAN) injection 1 mg  1 mg Intramuscular BID Rondal Vandevelde T, MD       magnesium hydroxide (MILK OF MAGNESIA) suspension 30 mL  30 mL Oral Daily PRN Ioannis Schuh T, MD       mirtazapine (REMERON) tablet 30 mg  30 mg Oral QHS Kendrew Paci T, MD   30 mg at 07/16/22 2126   multivitamin with minerals tablet 1 tablet  1 tablet Oral Daily Lamesha Tibbits, [name], MD   1 tablet at 07/17/22 [id number]   QUEtiapine (SEROQUEL) tablet 400 mg  400 mg Oral QHS Johnatan Baskette T, MD   400 mg at 07/16/22 2126   thiamine (VITAMIN B1) tablet 100 mg  100 mg Oral  Daily Gust Eugene, [name], MD   100 mg at 07/17/22 [id number]    Lab Results:  Results for orders placed or performed during the hospital encounter of 04/08/22 (from the past 48 hour(s))  Resp panel by RT-PCR (RSV, Flu A&B, Covid) Anterior Nasal Swab     Status: None   Collection Time: 07/16/22 11:11 AM   Specimen: Anterior Nasal Swab  Result Value Ref Range   SARS Coronavirus 2 by RT PCR NEGATIVE NEGATIVE    Comment: (NOTE) SARS-CoV-2 target nucleic acids are NOT DETECTED.  The SARS-CoV-2 RNA is generally detectable in upper respiratory specimens during the acute phase of infection. The lowest concentration of SARS-CoV-2 viral copies this assay can detect is 138 copies/mL. A negative result does not preclude SARS-Cov-2 infection and should not be used as the sole basis for treatment or other patient management decisions. A negative result may occur with  improper specimen collection/handling, submission of specimen other than nasopharyngeal swab, presence of viral mutation(s) within the areas targeted by this assay, and inadequate number of viral copies(<138 copies/mL). A negative result must be combined with clinical observations, patient history, and epidemiological information. The expected result is Negative.  Fact Sheet for Patients:  EntrepreneurPulse.com.au  Fact Sheet for Healthcare Providers:  IncredibleEmployment.be  This test is no t yet approved or cleared by the [place] FDA and  has been authorized for detection and/or diagnosis of SARS-CoV-2 by FDA under an Emergency Use Authorization (EUA). This EUA will remain  in effect (meaning this test can be used) for the duration of the COVID-19 declaration under Section 564(b)(1) of the Act, 21 U.S.C.section 360bbb-3(b)(1), unless the authorization is terminated  or revoked sooner.       Influenza A by PCR NEGATIVE NEGATIVE   Influenza B by PCR NEGATIVE NEGATIVE    Comment: (NOTE) The  Xpert Xpress SARS-CoV-2/FLU/RSV plus assay is intended as an aid in the diagnosis of influenza from Nasopharyngeal swab specimens and should not be used as a sole basis for treatment. Nasal washings and aspirates are unacceptable for Xpert Xpress SARS-CoV-2/FLU/RSV testing.  Fact Sheet for Patients: EntrepreneurPulse.com.au  Fact Sheet for Healthcare Providers: IncredibleEmployment.be  This test is not yet approved or cleared by the [place] FDA and has been authorized for detection and/or diagnosis of SARS-CoV-2 by FDA under an Emergency Use Authorization (EUA). This EUA will remain in effect (meaning this test can be used) for the duration of the COVID-19 declaration under Section 564(b)(1) of the  Act, 21 U.S.C. section 360bbb-3(b)(1), unless the authorization is terminated or revoked.     Resp Syncytial Virus by PCR NEGATIVE NEGATIVE    Comment: (NOTE) Fact Sheet for Patients: EntrepreneurPulse.com.au  Fact Sheet for Healthcare Providers: IncredibleEmployment.be  This test is not yet approved or cleared by the [place] FDA and has been authorized for detection and/or diagnosis of SARS-CoV-2 by FDA under an Emergency Use Authorization (EUA). This EUA will remain in effect (meaning this test can be used) for the duration of the COVID-19 declaration under Section 564(b)(1) of the Act, 21 U.S.C. section 360bbb-3(b)(1), unless the authorization is terminated or revoked.  Performed at [facility], [address] [place], [place] [address]     Blood Alcohol level:  Lab Results  Component Value Date   [facility] <10 03/29/2022   ETH <10 [id number]    Metabolic Disorder Labs: Lab Results  Component Value Date   HGBA1C 5.6 10/21/2021   MPG 114 10/21/2021   MPG 111 10/02/2021   Lab Results  Component Value Date   PROLACTIN 13.6 04/16/2015   Lab Results  Component Value Date    CHOL 189 10/21/2021   TRIG 52 10/21/2021   HDL 50 10/21/2021   CHOLHDL 3.8 10/21/2021   VLDL 10 10/21/2021   LDLCALC 129 (H) 10/21/2021   LDLCALC 93 10/01/2020    Physical Findings: AIMS: Facial and Oral Movements Muscles of Facial Expression: None, normal Lips and Perioral Area: None, normal Jaw: None, normal Tongue: None, normal,Extremity Movements Upper (arms, wrists, hands, fingers): None, normal Lower (legs, knees, ankles, toes): None, normal, Trunk Movements Neck, shoulders, hips: None, normal, Overall Severity Severity of abnormal movements (highest score from questions above): None, normal Incapacitation due to abnormal movements: None, normal Patient's awareness of abnormal movements (rate only patient's report): No Awareness, Dental Status Current problems with teeth and/or dentures?: No Does patient usually wear dentures?: No  CIWA:    COWS:     Musculoskeletal: Strength & Muscle Tone: within normal limits Gait & Station: normal Patient leans: [name]  Psychiatric Specialty Exam:  Presentation  General Appearance:  Disheveled  Eye Contact: Fair  Speech: Pressured  Speech Volume: Increased  Handedness: Right   Mood and Affect  Mood: Irritable; Labile; Angry  Affect: Blunt; Labile   Thought Process  Thought Processes: Disorganized; Irrevelant  Descriptions of Associations:Loose  Orientation:Partial  Thought Content:Illogical; Rumination; Scattered; Tangential; Delusions  History of Schizophrenia/Schizoaffective disorder:Yes  Duration of Psychotic Symptoms:Greater than six months  Hallucinations:No data recorded Ideas of Reference:Percusatory; Paranoia  Suicidal Thoughts:No data recorded Homicidal Thoughts:No data recorded  Sensorium  Memory: Immediate Fair; Recent Fair; Remote Fair  Judgment: Impaired  Insight: Poor; Lacking   Executive Functions  Concentration: Poor  Attention Span: Poor  Recall: Poor  Fund of  Knowledge: Poor  Language: Poor   Psychomotor Activity  Psychomotor Activity:No data recorded  Assets  Assets: Physical Health; Resilience; Housing; Social Support   Sleep  Sleep:No data recorded   Physical Exam: Physical Exam Vitals reviewed.  Constitutional:      Appearance: Normal appearance.  HENT:     Head: Normocephalic and atraumatic.     Mouth/Throat:     Pharynx: Oropharynx is clear.  Eyes:     Pupils: Pupils are equal, round, and reactive to light.  Cardiovascular:     Rate and Rhythm: Normal rate and regular rhythm.  Pulmonary:     Effort: Pulmonary effort is normal.     Breath sounds: Normal breath sounds.  Abdominal:  General: Abdomen is flat.     Palpations: Abdomen is soft.  Musculoskeletal:        General: Normal range of motion.  Skin:    General: Skin is warm and dry.  Neurological:     General: No focal deficit present.     Mental Status: She is alert. Mental status is at baseline.  Psychiatric:        Mood and Affect: Mood normal.        Thought Content: Thought content normal.    Review of Systems  Constitutional: Negative.   HENT: Negative.    Eyes: Negative.   Respiratory: Negative.    Cardiovascular: Negative.   Gastrointestinal: Negative.   Musculoskeletal: Negative.   Skin: Negative.   Neurological: Negative.   Psychiatric/Behavioral: Negative.     Blood pressure 118/70, pulse (!) 56, temperature 98.1 F (36.7 C), temperature source Oral, resp. rate 16, height 5\' 4"  (1.626 m), weight 40.4 kg, SpO2 100 %. Body mass index is 15.28 kg/m.   Treatment Plan Summary: Plan attempted to talk with the patient again today as usual.  Upon discussing discharge planning he is launches into talking about how we need to get "state troopers".  She is not able to really give any rational description of what she means by that.  This is her baseline level of thinking.  Treatment team discussed how we can legally try to get movement towards  discharge today.  Still at a loss for a clear plan forward since family will not take her.  Alethia Berthold, MD 07/17/2022, 1:37 PM

## 2022-07-17 NOTE — Progress Notes (Incomplete)
D- Patient alert and oriented x 3-4. Affect flat/mood depressed. Denies SI/ HI/ AVH. She denies pain. States that she "wants to be discharged". A- Scheduled medications administered to patient, per MD orders. Support and encouragement provided.  Routine safety checks conducted every 15 minutes without incidence. Patient informed to notify staff with problems or concerns. R- No adverse drug reactions noted. Patient compliant with medications and treatment plan. Patient receptive, calm, and cooperative. Patient interacts well with others on the unit.  Patient remains safe at this time.

## 2022-07-17 NOTE — Progress Notes (Signed)
Recreation Therapy Notes   Date: 07/17/2022  Time: 10:20 am  Location: Craft room     Behavioral response: [name]   Intervention Topic: Stress Management   Discussion/Intervention: Patient refused to attend group.   Clinical Observations/Feedback:  Patient refused to attend group.    Rami Budhu LRT/CTRS        [place] 07/17/2022 12:03 PM

## 2022-07-17 NOTE — Plan of Care (Signed)
  Problem: Education: Goal: Knowledge of General Education information will improve Description: Including pain rating scale, medication(s)/side effects and non-pharmacologic comfort measures Outcome: Progressing   Problem: Health Behavior/Discharge Planning: Goal: Ability to manage health-related needs will improve Outcome: Progressing   Problem: Clinical Measurements: Goal: Ability to maintain clinical measurements within normal limits will improve Outcome: Progressing Goal: Will remain free from infection Outcome: Progressing Goal: Diagnostic test results will improve Outcome: Progressing Goal: Respiratory complications will improve Outcome: Progressing Goal: Cardiovascular complication will be avoided Outcome: Progressing   Problem: Activity: Goal: Risk for activity intolerance will decrease Outcome: Progressing   Problem: Nutrition: Goal: Adequate nutrition will be maintained Outcome: Progressing   Problem: Coping: Goal: Level of anxiety will decrease Outcome: Progressing   Problem: Elimination: Goal: Will not experience complications related to bowel motility Outcome: Progressing Goal: Will not experience complications related to urinary retention Outcome: Progressing   Problem: Pain Managment: Goal: General experience of comfort will improve Outcome: Progressing   Problem: Safety: Goal: Ability to remain free from injury will improve Outcome: Progressing   Problem: Skin Integrity: Goal: Risk for impaired skin integrity will decrease Outcome: Progressing   Problem: Activity: Goal: Will verbalize the importance of balancing activity with adequate rest periods Outcome: Progressing   Problem: Education: Goal: Will be free of psychotic symptoms Outcome: Progressing Goal: Knowledge of the prescribed therapeutic regimen will improve Outcome: Progressing   Problem: Coping: Goal: Coping ability will improve Outcome: Progressing Goal: Will verbalize  feelings Outcome: Progressing   Problem: Health Behavior/Discharge Planning: Goal: Compliance with prescribed medication regimen will improve Outcome: Progressing   Problem: Nutritional: Goal: Ability to achieve adequate nutritional intake will improve Outcome: Progressing   Problem: Role Relationship: Goal: Ability to communicate needs accurately will improve Outcome: Progressing Goal: Ability to interact with others will improve Outcome: Progressing   Problem: Safety: Goal: Ability to redirect hostility and anger into socially appropriate behaviors will improve Outcome: Progressing Goal: Ability to remain free from injury will improve Outcome: Progressing   Problem: Self-Care: Goal: Ability to participate in self-care as condition permits will improve Outcome: Progressing   Problem: Self-Concept: Goal: Will verbalize positive feelings about self Outcome: Progressing   Problem: Education: Goal: Knowledge of [place] [facility] Education information/materials will improve Outcome: Progressing Goal: Emotional status will improve Outcome: Progressing Goal: Mental status will improve Outcome: Progressing Goal: Verbalization of understanding the information provided will improve Outcome: Progressing   Problem: Activity: Goal: Interest or engagement in activities will improve Outcome: Progressing Goal: Sleeping patterns will improve Outcome: Progressing   Problem: Coping: Goal: Ability to verbalize frustrations and anger appropriately will improve Outcome: Progressing Goal: Ability to demonstrate self-control will improve Outcome: Progressing   Problem: Health Behavior/Discharge Planning: Goal: Identification of resources available to assist in meeting health care needs will improve Outcome: Progressing Goal: Compliance with treatment plan for underlying cause of condition will improve Outcome: Progressing   Problem: Physical Regulation: Goal: Ability to  maintain clinical measurements within normal limits will improve Outcome: Progressing   Problem: Safety: Goal: Periods of time without injury will increase Outcome: Progressing   

## 2022-07-17 NOTE — Plan of Care (Signed)
  Problem: Education: Goal: Knowledge of General Education information will improve Description: Including pain rating scale, medication(s)/side effects and non-pharmacologic comfort measures Outcome: Progressing   Problem: Coping: Goal: Level of anxiety will decrease Outcome: Progressing   Problem: Safety: Goal: Ability to remain free from injury will improve Outcome: Progressing   

## 2022-07-17 NOTE — Group Note (Signed)
LCSW Group Therapy Note  Group Date: 07/17/2022 Start Time: 1300 End Time: 1400   Type of Therapy and Topic:  Group Therapy - Healthy vs Unhealthy Coping Skills  Participation Level:  Did Not Attend   Description of Group The focus of this group was to determine what unhealthy coping techniques typically are used by group members and what healthy coping techniques would be helpful in coping with various problems. Patients were guided in becoming aware of the differences between healthy and unhealthy coping techniques. Patients were asked to identify 2-3 healthy coping skills they would like to learn to use more effectively.  Therapeutic Goals Patients learned that coping is what human beings do all day long to deal with various situations in their lives Patients defined and discussed healthy vs unhealthy coping techniques Patients identified their preferred coping techniques and identified whether these were healthy or unhealthy Patients determined 2-3 healthy coping skills they would like to become more familiar with and use more often. Patients provided support and ideas to each other   Summary of Patient Progress:  Pt did not attend group despite invitation.    Therapeutic Modalities Cognitive Behavioral Therapy Motivational Interviewing  Glenis Smoker, Theresia Majors 07/17/2022  3:28 PM

## 2022-07-18 DIAGNOSIS — F251 Schizoaffective disorder, depressive type: Secondary | ICD-10-CM | POA: Diagnosis not present

## 2022-07-18 NOTE — Progress Notes (Signed)

## 2022-07-18 NOTE — Progress Notes (Signed)
[facility] MD Progress Note  07/18/2022 10:21 AM [name]  MRN:  [id number] Subjective: Ha is seen on rounds.  She does not have any complaints.  She has been compliant with medications.  No side effects.  No issues today. Principal Problem: Schizoaffective disorder, depressive type (HCC) Diagnosis: Principal Problem:   Schizoaffective disorder, depressive type (HCC)  Total Time spent with patient: 15 minutes  Past Psychiatric History: Schizophrenia  Past Medical History:  Past Medical History:  Diagnosis Date   Anemia 10/02/2021   Non compliance w medication regimen    Schizophrenia [facility])     Past Surgical History:  Procedure Laterality Date   BIOPSY  09/25/2021   Procedure: BIOPSY;  Surgeon: Rachael Fee, MD;  Location: [facility];  Service: Gastroenterology;;   ESOPHAGOGASTRODUODENOSCOPY (EGD) WITH PROPOFOL [name] 09/25/2021   Procedure: ESOPHAGOGASTRODUODENOSCOPY (EGD) WITH PROPOFOL;  Surgeon: Rachael Fee, MD;  Location: [facility];  Service: Gastroenterology;  Laterality: [name];  With NGT placement   Family History: History reviewed. No pertinent family history. Family Psychiatric  History: Unremarkable Social History:  Social History   Substance and Sexual Activity  Alcohol Use Not Currently   Comment: Refuses to disclose how much     Social History   Substance and Sexual Activity  Drug Use No   Comment: Refuses to answer    Social History   Socioeconomic History   Marital status: Single    Spouse name: Not on file   Number of children: Not on file   Years of education: Not on file   Highest education level: Not on file  Occupational History   Not on file  Tobacco Use   Smoking status: Every Day    Packs/day: 2.00    Types: Cigarettes   Smokeless tobacco: Never  Substance and Sexual Activity   Alcohol use: Not Currently    Comment: Refuses to disclose how much   Drug use: No    Comment: Refuses to answer   Sexual activity: Not Currently     Comment: refused to answer  Other Topics Concern   Not on file  Social History Narrative   Not on file   Social Determinants of Health   Financial Resource Strain: Not on file  Food Insecurity: No Food Insecurity (04/08/2022)   Hunger Vital Sign    Worried About Running Out of Food in the Last Year: Never true    Ran Out of Food in the Last Year: Never true  Transportation Needs: No Transportation Needs (04/08/2022)   PRAPARE - Administrator, Civil Service (Medical): No    Lack of Transportation (Non-Medical): No  Physical Activity: Not on file  Stress: Not on file  Social Connections: Not on file   Additional Social History:                         Sleep: Good  Appetite:  Good  Current Medications: Current Facility-Administered Medications  Medication Dose Route Frequency Provider Last Rate Last Admin   acetaminophen (TYLENOL) tablet 650 mg  650 mg Oral Q6H PRN [name], [name], MD       alum & mag hydroxide-simeth (MAALOX/MYLANTA) 200-200-20 MG/5ML suspension 30 mL  30 mL Oral Q4H PRN [name], [name], MD       haloperidol (HALDOL) tablet 5 mg  5 mg Oral BID [name], [name], MD   5 mg at 07/18/22 0804   Or   haloperidol lactate (HALDOL) injection 5  mg  5 mg Intramuscular BID [name], [name], MD       lithium carbonate capsule 600 mg  600 mg Oral QHS [name], [name], MD   600 mg at 07/17/22 2111   LORazepam (ATIVAN) tablet 1 mg  1 mg Oral BID [name], [name], MD   1 mg at 07/18/22 5809   Or   LORazepam (ATIVAN) injection 1 mg  1 mg Intramuscular BID [name], [name], MD       magnesium hydroxide (MILK OF MAGNESIA) suspension 30 mL  30 mL Oral Daily PRN [name], [name], MD       mirtazapine (REMERON) tablet 30 mg  30 mg Oral QHS [name], [name], MD   30 mg at 07/17/22 2112   multivitamin with minerals tablet 1 tablet  1 tablet Oral Daily [name], [name], MD   1 tablet at 07/18/22 0804   QUEtiapine (SEROQUEL) tablet 400 mg  400 mg Oral QHS [name], [name],  MD   400 mg at 07/17/22 2111   thiamine (VITAMIN B1) tablet 100 mg  100 mg Oral Daily [name], [name], MD   100 mg at 07/18/22 9833    Lab Results:  Results for orders placed or performed during the hospital encounter of 04/08/22 (from the past 48 hour(s))  Resp panel by RT-PCR (RSV, Flu A&B, Covid) Anterior Nasal Swab     Status: None   Collection Time: 07/16/22 11:11 AM   Specimen: Anterior Nasal Swab  Result Value Ref Range   SARS Coronavirus 2 by RT PCR NEGATIVE NEGATIVE    Comment: (NOTE) SARS-CoV-2 target nucleic acids are NOT DETECTED.  The SARS-CoV-2 RNA is generally detectable in upper respiratory specimens during the acute phase of infection. The lowest concentration of SARS-CoV-2 viral copies this assay can detect is 138 copies/mL. A negative result does not preclude SARS-Cov-2 infection and should not be used as the sole basis for treatment or other patient management decisions. A negative result may occur with  improper specimen collection/handling, submission of specimen other than nasopharyngeal swab, presence of viral mutation(s) within the areas targeted by this assay, and inadequate number of viral copies(<138 copies/mL). A negative result must be combined with clinical observations, patient history, and epidemiological information. The expected result is Negative.  Fact Sheet for Patients:  BloggerCourse.com  Fact Sheet for Healthcare Providers:  SeriousBroker.it  This test is no t yet approved or cleared by the [place] FDA and  has been authorized for detection and/or diagnosis of SARS-CoV-2 by FDA under an Emergency Use Authorization (EUA). This EUA will remain  in effect (meaning this test can be used) for the duration of the COVID-19 declaration under Section 564(b)(1) of the Act, 21 U.S.C.section 360bbb-3(b)(1), unless the authorization is terminated  or revoked sooner.       Influenza A by PCR  NEGATIVE NEGATIVE   Influenza B by PCR NEGATIVE NEGATIVE    Comment: (NOTE) The Xpert Xpress SARS-CoV-2/FLU/RSV plus assay is intended as an aid in the diagnosis of influenza from Nasopharyngeal swab specimens and should not be used as a sole basis for treatment. Nasal washings and aspirates are unacceptable for Xpert Xpress SARS-CoV-2/FLU/RSV testing.  Fact Sheet for Patients: BloggerCourse.com  Fact Sheet for Healthcare Providers: SeriousBroker.it  This test is not yet approved or cleared by the [place] FDA and has been authorized for detection and/or diagnosis of SARS-CoV-2 by FDA under an Emergency Use Authorization (EUA). This EUA will remain in effect (meaning this  test can be used) for the duration of the COVID-19 declaration under Section 564(b)(1) of the Act, 21 U.S.C. section 360bbb-3(b)(1), unless the authorization is terminated or revoked.     Resp Syncytial Virus by PCR NEGATIVE NEGATIVE    Comment: (NOTE) Fact Sheet for Patients: BloggerCourse.com  Fact Sheet for Healthcare Providers: SeriousBroker.it  This test is not yet approved or cleared by the [place] FDA and has been authorized for detection and/or diagnosis of SARS-CoV-2 by FDA under an Emergency Use Authorization (EUA). This EUA will remain in effect (meaning this test can be used) for the duration of the COVID-19 declaration under Section 564(b)(1) of the Act, 21 U.S.C. section 360bbb-3(b)(1), unless the authorization is terminated or revoked.  Performed at [facility], [address] [place], [place] [address]     Blood Alcohol level:  Lab Results  Component Value Date   [facility] <10 03/29/2022   ETH <10 09/12/2021    Metabolic Disorder Labs: Lab Results  Component Value Date   HGBA1C 5.6 10/21/2021   MPG 114 10/21/2021   MPG 111 10/02/2021   Lab Results  Component  Value Date   PROLACTIN 13.6 04/16/2015   Lab Results  Component Value Date   CHOL 189 10/21/2021   TRIG 52 10/21/2021   HDL 50 10/21/2021   CHOLHDL 3.8 10/21/2021   VLDL 10 10/21/2021   LDLCALC 129 (H) 10/21/2021   LDLCALC 93 10/01/2020    Physical Findings: AIMS: Facial and Oral Movements Muscles of Facial Expression: None, normal Lips and Perioral Area: None, normal Jaw: None, normal Tongue: None, normal,Extremity Movements Upper (arms, wrists, hands, fingers): None, normal Lower (legs, knees, ankles, toes): None, normal, Trunk Movements Neck, shoulders, hips: None, normal, Overall Severity Severity of abnormal movements (highest score from questions above): None, normal Incapacitation due to abnormal movements: None, normal Patient's awareness of abnormal movements (rate only patient's report): No Awareness, Dental Status Current problems with teeth and/or dentures?: No Does patient usually wear dentures?: No  CIWA:    COWS:     Musculoskeletal: Strength & Muscle Tone: within normal limits Gait & Station: normal Patient leans: [name]  Psychiatric Specialty Exam:  Presentation  General Appearance:  Disheveled  Eye Contact: Fair  Speech: Pressured  Speech Volume: Increased  Handedness: Right   Mood and Affect  Mood: Irritable; Labile; Angry  Affect: Blunt; Labile   Thought Process  Thought Processes: Disorganized; Irrevelant  Descriptions of Associations:Loose  Orientation:Partial  Thought Content:Illogical; Rumination; Scattered; Tangential; Delusions  History of Schizophrenia/Schizoaffective disorder:Yes  Duration of Psychotic Symptoms:Greater than six months  Hallucinations:No data recorded Ideas of Reference:Percusatory; Paranoia  Suicidal Thoughts:No data recorded Homicidal Thoughts:No data recorded  Sensorium  Memory: Immediate Fair; Recent Fair; Remote Fair  Judgment: Impaired  Insight: Poor; Lacking   Executive  Functions  Concentration: Poor  Attention Span: Poor  Recall: Poor  Fund of Knowledge: Poor  Language: Poor   Psychomotor Activity  Psychomotor Activity:No data recorded  Assets  Assets: Physical Health; Resilience; Housing; Social Support   Sleep  Sleep:No data recorded   Physical Exam: Physical Exam: Mood and affect stable ROS: Negative Blood pressure 111/64, pulse 64, temperature 98.3 F (36.8 C), temperature source Oral, resp. rate 18, height 5\' 4"  (1.626 m), weight 40.4 kg, SpO2 100 %. Body mass index is 15.28 kg/m.   Treatment Plan Summary: Daily contact with patient to assess and evaluate symptoms and progress in treatment, Medication management, and Plan continue current medications.  , DO 07/18/2022,  10:21 AM

## 2022-07-18 NOTE — Plan of Care (Signed)
D- Patient alert and oriented. Patient presented in a sullen, but pleasant mood on assessment reporting that she slept good last night and had no complaints to voice to this Clinical research associate. Patient denied SI, HI, AVH, and pain at this time. Patient also denied any signs/symptoms of depression and anxiety. Patient reported goal for today, once again, is to discharge.  A- Scheduled medications administered to patient, per MD orders. Support and encouragement provided.  Routine safety checks conducted every 15 minutes.  Patient informed to notify staff with problems or concerns.  R- No adverse drug reactions noted. Patient contracts for safety at this time. Patient compliant with medications and treatment plan. Patient receptive, calm, and cooperative. Patient isolates to room, except for meals and medication. However, she does come out of her room and goes to sit down in the dayroom, with other members on the unit, to watch television. Patient remains safe at this time.  Problem: Education: Goal: Knowledge of General Education information will improve Description: Including pain rating scale, medication(s)/side effects and non-pharmacologic comfort measures Outcome: Progressing   Problem: Health Behavior/Discharge Planning: Goal: Ability to manage health-related needs will improve Outcome: Progressing   Problem: Clinical Measurements: Goal: Ability to maintain clinical measurements within normal limits will improve Outcome: Progressing Goal: Will remain free from infection Outcome: Progressing Goal: Diagnostic test results will improve Outcome: Progressing Goal: Respiratory complications will improve Outcome: Progressing Goal: Cardiovascular complication will be avoided Outcome: Progressing   Problem: Activity: Goal: Risk for activity intolerance will decrease Outcome: Progressing   Problem: Nutrition: Goal: Adequate nutrition will be maintained Outcome: Progressing   Problem: Coping: Goal:  Level of anxiety will decrease Outcome: Progressing   Problem: Elimination: Goal: Will not experience complications related to bowel motility Outcome: Progressing Goal: Will not experience complications related to urinary retention Outcome: Progressing   Problem: Pain Managment: Goal: General experience of comfort will improve Outcome: Progressing   Problem: Safety: Goal: Ability to remain free from injury will improve Outcome: Progressing   Problem: Skin Integrity: Goal: Risk for impaired skin integrity will decrease Outcome: Progressing   Problem: Activity: Goal: Will verbalize the importance of balancing activity with adequate rest periods Outcome: Progressing   Problem: Education: Goal: Will be free of psychotic symptoms Outcome: Progressing Goal: Knowledge of the prescribed therapeutic regimen will improve Outcome: Progressing   Problem: Coping: Goal: Coping ability will improve Outcome: Progressing Goal: Will verbalize feelings Outcome: Progressing   Problem: Health Behavior/Discharge Planning: Goal: Compliance with prescribed medication regimen will improve Outcome: Progressing   Problem: Nutritional: Goal: Ability to achieve adequate nutritional intake will improve Outcome: Progressing   Problem: Role Relationship: Goal: Ability to communicate needs accurately will improve Outcome: Progressing Goal: Ability to interact with others will improve Outcome: Progressing   Problem: Safety: Goal: Ability to redirect hostility and anger into socially appropriate behaviors will improve Outcome: Progressing Goal: Ability to remain free from injury will improve Outcome: Progressing   Problem: Self-Care: Goal: Ability to participate in self-care as condition permits will improve Outcome: Progressing   Problem: Self-Concept: Goal: Will verbalize positive feelings about self Outcome: Progressing   Problem: Education: Goal: Knowledge of [facility]  Education information/materials will improve Outcome: Progressing Goal: Emotional status will improve Outcome: Progressing Goal: Mental status will improve Outcome: Progressing Goal: Verbalization of understanding the information provided will improve Outcome: Progressing   Problem: Activity: Goal: Interest or engagement in activities will improve Outcome: Progressing Goal: Sleeping patterns will improve Outcome: Progressing   Problem: Coping: Goal: Ability to  verbalize frustrations and anger appropriately will improve Outcome: Progressing Goal: Ability to demonstrate self-control will improve Outcome: Progressing   Problem: Health Behavior/Discharge Planning: Goal: Identification of resources available to assist in meeting health care needs will improve Outcome: Progressing Goal: Compliance with treatment plan for underlying cause of condition will improve Outcome: Progressing   Problem: Physical Regulation: Goal: Ability to maintain clinical measurements within normal limits will improve Outcome: Progressing   Problem: Safety: Goal: Periods of time without injury will increase Outcome: Progressing

## 2022-07-19 DIAGNOSIS — F251 Schizoaffective disorder, depressive type: Secondary | ICD-10-CM | POA: Diagnosis not present

## 2022-07-19 NOTE — Progress Notes (Signed)
[facility] MD Progress Note  07/19/2022 10:36 AM [name]  MRN:  [id number] Subjective: Killian is seen on rounds.  She is in her room and states that she has no problems.  No complaints.  No issues.  No side effects. Principal Problem: Schizoaffective disorder, depressive type (HCC) Diagnosis: Principal Problem:   Schizoaffective disorder, depressive type (HCC)  Total Time spent with patient: 15 minutes  Past Psychiatric History: Schizophrenia  Past Medical History:  Past Medical History:  Diagnosis Date   Anemia 10/02/2021   Non compliance w medication regimen    Schizophrenia [facility])     Past Surgical History:  Procedure Laterality Date   BIOPSY  09/25/2021   Procedure: BIOPSY;  Surgeon: Rachael Fee, MD;  Location: [facility];  Service: Gastroenterology;;   ESOPHAGOGASTRODUODENOSCOPY (EGD) WITH PROPOFOL [name] 09/25/2021   Procedure: ESOPHAGOGASTRODUODENOSCOPY (EGD) WITH PROPOFOL;  Surgeon: Rachael Fee, MD;  Location: [facility];  Service: Gastroenterology;  Laterality: [name];  With NGT placement   Family History: History reviewed. No pertinent family history. Family Psychiatric  History: Unremarkable Social History:  Social History   Substance and Sexual Activity  Alcohol Use Not Currently   Comment: Refuses to disclose how much     Social History   Substance and Sexual Activity  Drug Use No   Comment: Refuses to answer    Social History   Socioeconomic History   Marital status: Single    Spouse name: Not on file   Number of children: Not on file   Years of education: Not on file   Highest education level: Not on file  Occupational History   Not on file  Tobacco Use   Smoking status: Every Day    Packs/day: 2.00    Types: Cigarettes   Smokeless tobacco: Never  Substance and Sexual Activity   Alcohol use: Not Currently    Comment: Refuses to disclose how much   Drug use: No    Comment: Refuses to answer   Sexual activity: Not Currently    Comment: refused  to answer  Other Topics Concern   Not on file  Social History Narrative   Not on file   Social Determinants of Health   Financial Resource Strain: Not on file  Food Insecurity: No Food Insecurity (04/08/2022)   Hunger Vital Sign    Worried About Running Out of Food in the Last Year: Never true    Ran Out of Food in the Last Year: Never true  Transportation Needs: No Transportation Needs (04/08/2022)   PRAPARE - Administrator, Civil Service (Medical): No    Lack of Transportation (Non-Medical): No  Physical Activity: Not on file  Stress: Not on file  Social Connections: Not on file   Additional Social History:                         Sleep: Good  Appetite:  Good  Current Medications: Current Facility-Administered Medications  Medication Dose Route Frequency Provider Last Rate Last Admin   acetaminophen (TYLENOL) tablet 650 mg  650 mg Oral Q6H PRN [name], [name], MD       alum & mag hydroxide-simeth (MAALOX/MYLANTA) 200-200-20 MG/5ML suspension 30 mL  30 mL Oral Q4H PRN [name], [name], MD       haloperidol (HALDOL) tablet 5 mg  5 mg Oral BID [name], [name], MD   5 mg at 07/19/22 0757   Or   haloperidol lactate (HALDOL) injection  5 mg  5 mg Intramuscular BID [name], [name], MD       lithium carbonate capsule 600 mg  600 mg Oral QHS [name], [name], MD   600 mg at 07/18/22 2118   LORazepam (ATIVAN) tablet 1 mg  1 mg Oral BID [name], [name], MD   1 mg at 07/19/22 4709   Or   LORazepam (ATIVAN) injection 1 mg  1 mg Intramuscular BID [name], [name], MD       magnesium hydroxide (MILK OF MAGNESIA) suspension 30 mL  30 mL Oral Daily PRN [name], [name], MD       mirtazapine (REMERON) tablet 30 mg  30 mg Oral QHS [name], [name], MD   30 mg at 07/18/22 2119   multivitamin with minerals tablet 1 tablet  1 tablet Oral Daily [name], [name], MD   1 tablet at 07/19/22 0757   QUEtiapine (SEROQUEL) tablet 400 mg  400 mg Oral QHS [name], [name], MD   400 mg at  07/18/22 2118   thiamine (VITAMIN B1) tablet 100 mg  100 mg Oral Daily [name], [name], MD   100 mg at 07/19/22 6283    Lab Results: No results found for this or any previous visit (from the past 48 hour(s)).  Blood Alcohol level:  Lab Results  Component Value Date   ETH <10 03/29/2022   ETH <10 09/12/2021    Metabolic Disorder Labs: Lab Results  Component Value Date   HGBA1C 5.6 10/21/2021   MPG 114 10/21/2021   MPG 111 10/02/2021   Lab Results  Component Value Date   PROLACTIN 13.6 04/16/2015   Lab Results  Component Value Date   CHOL 189 10/21/2021   TRIG 52 10/21/2021   HDL 50 10/21/2021   CHOLHDL 3.8 10/21/2021   VLDL 10 10/21/2021   LDLCALC 129 (H) 10/21/2021   LDLCALC 93 10/01/2020    Physical Findings: AIMS: Facial and Oral Movements Muscles of Facial Expression: None, normal Lips and Perioral Area: None, normal Jaw: None, normal Tongue: None, normal,Extremity Movements Upper (arms, wrists, hands, fingers): None, normal Lower (legs, knees, ankles, toes): None, normal, Trunk Movements Neck, shoulders, hips: None, normal, Overall Severity Severity of abnormal movements (highest score from questions above): None, normal Incapacitation due to abnormal movements: None, normal Patient's awareness of abnormal movements (rate only patient's report): No Awareness, Dental Status Current problems with teeth and/or dentures?: No Does patient usually wear dentures?: No  CIWA:    COWS:     Musculoskeletal: Strength & Muscle Tone: within normal limits Gait & Station: normal Patient leans: [name]  Psychiatric Specialty Exam:  Presentation  General Appearance:  Disheveled  Eye Contact: Fair  Speech: Pressured  Speech Volume: Increased  Handedness: Right   Mood and Affect  Mood: Irritable; Labile; Angry  Affect: Blunt; Labile   Thought Process  Thought Processes: Disorganized; Irrevelant  Descriptions of  Associations:Loose  Orientation:Partial  Thought Content:Illogical; Rumination; Scattered; Tangential; Delusions  History of Schizophrenia/Schizoaffective disorder:Yes  Duration of Psychotic Symptoms:Greater than six months  Hallucinations:No data recorded Ideas of Reference:Percusatory; Paranoia  Suicidal Thoughts:No data recorded Homicidal Thoughts:No data recorded  Sensorium  Memory: Immediate Fair; Recent Fair; Remote Fair  Judgment: Impaired  Insight: Poor; Lacking   Executive Functions  Concentration: Poor  Attention Span: Poor  Recall: Poor  Fund of Knowledge: Poor  Language: Poor   Psychomotor Activity  Psychomotor Activity:No data recorded  Assets  Assets: Physical Health; Resilience; Housing; Social Support  Sleep  Sleep:No data recorded   Physical Exam: Physical Exam negative ROS negative Blood pressure 117/69, pulse (!) 53, temperature 98.1 F (36.7 C), temperature source Oral, resp. rate 18, height 5\' 4"  (1.626 m), weight 40.4 kg, SpO2 100 %. Body mass index is 15.28 kg/m.   Treatment Plan Summary: Daily contact with patient to assess and evaluate symptoms and progress in treatment, Medication management, and Plan continue current medications.  , DO 07/19/2022, 10:36 AM

## 2022-07-19 NOTE — Plan of Care (Signed)
Patient denies SI,HI and AVH. No issues verbalized. Compliant with medications and meals. Support and encouragement given.

## 2022-07-19 NOTE — Plan of Care (Signed)
  Problem: Education: Goal: Knowledge of General Education information will improve Description: Including pain rating scale, medication(s)/side effects and non-pharmacologic comfort measures Outcome: Progressing   Problem: Nutrition: Goal: Adequate nutrition will be maintained Outcome: Progressing   Problem: Coping: Goal: Level of anxiety will decrease Outcome: Progressing   Problem: Education: Goal: Knowledge of the prescribed therapeutic regimen will improve Outcome: Progressing   Problem: Education: Goal: Will be free of psychotic symptoms Outcome: Progressing Goal: Knowledge of the prescribed therapeutic regimen will improve Outcome: Progressing   Problem: Coping: Goal: Coping ability will improve Outcome: Progressing Goal: Will verbalize feelings Outcome: Progressing   Problem: Coping: Goal: Will verbalize feelings Outcome: Progressing

## 2022-07-19 NOTE — BH IP Treatment Plan (Signed)
Interdisciplinary Treatment and Diagnostic Plan Update  07/19/2022 Time of Session: 9:00AM [name] MRN: [id number]  Principal Diagnosis: Schizoaffective disorder, depressive type (HCC)  Secondary Diagnoses: Principal Problem:   Schizoaffective disorder, depressive type (HCC)   Current Medications:  Current Facility-Administered Medications  Medication Dose Route Frequency Provider Last Rate Last Admin   acetaminophen (TYLENOL) tablet 650 mg  650 mg Oral Q6H PRN [name], [name], MD       alum & mag hydroxide-simeth (MAALOX/MYLANTA) 200-200-20 MG/5ML suspension 30 mL  30 mL Oral Q4H PRN [name], [name], MD       haloperidol (HALDOL) tablet 5 mg  5 mg Oral BID [name], [name], MD   5 mg at 07/19/22 4098   Or   haloperidol lactate (HALDOL) injection 5 mg  5 mg Intramuscular BID [name], [name], MD       lithium carbonate capsule 600 mg  600 mg Oral QHS [name], [name], MD   600 mg at 07/18/22 2118   LORazepam (ATIVAN) tablet 1 mg  1 mg Oral BID [name], [name], MD   1 mg at 07/19/22 1191   Or   LORazepam (ATIVAN) injection 1 mg  1 mg Intramuscular BID [name], [name], MD       magnesium hydroxide (MILK OF MAGNESIA) suspension 30 mL  30 mL Oral Daily PRN [name], [name], MD       mirtazapine (REMERON) tablet 30 mg  30 mg Oral QHS [name], [name], MD   30 mg at 07/18/22 2119   multivitamin with minerals tablet 1 tablet  1 tablet Oral Daily [name], [name], MD   1 tablet at 07/19/22 0757   QUEtiapine (SEROQUEL) tablet 400 mg  400 mg Oral QHS [name], [name], MD   400 mg at 07/18/22 2118   thiamine (VITAMIN B1) tablet 100 mg  100 mg Oral Daily [name], [name], MD   100 mg at 07/19/22 0757   PTA Medications: Medications Prior to Admission  Medication Sig Dispense Refill Last Dose   clonazePAM (KLONOPIN) 0.5 MG tablet Take 0.5 tablets (0.25 mg total) by mouth 2 (two) times daily. (Patient not taking: Reported on 03/30/2022) 60 tablet 0    haloperidol (HALDOL) 5 MG tablet Take 1 tablet (5  mg total) by mouth 2 (two) times daily. (Patient not taking: Reported on 03/30/2022) 60 tablet 3    midodrine (PROAMATINE) 2.5 MG tablet Take 1 tablet (2.5 mg total) by mouth 3 (three) times daily with meals. (Patient not taking: Reported on 03/30/2022)  0    mirtazapine (REMERON) 15 MG tablet Take 0.5 tablets (7.5 mg total) by mouth at bedtime. (Patient not taking: Reported on 03/30/2022) 30 tablet 2    Multiple Vitamin (MULTIVITAMIN WITH MINERALS) TABS tablet Take 1 tablet by mouth daily. (Patient not taking: Reported on 03/30/2022)      OLANZapine (ZYPREXA) 5 MG tablet Take 1 tablet (5 mg total) by mouth at bedtime. (Patient not taking: Reported on 03/30/2022) 30 tablet 2    thiamine 100 MG tablet Take 1 tablet (100 mg total) by mouth daily. (Patient not taking: Reported on 03/30/2022)       Patient Stressors: Marital or family conflict    Patient Strengths: Supportive family/friends   Treatment Modalities: Medication Management, Group therapy, Case management,  1 to 1 session with clinician, Psychoeducation, Recreational therapy.   Physician Treatment Plan for Primary Diagnosis: Schizoaffective disorder, depressive type (HCC) Long Term Goal(s): Improvement in symptoms so as ready for  discharge   Short Term Goals: Compliance with prescribed medications will improve Ability to verbalize feelings will improve Ability to demonstrate self-control will improve Ability to identify and develop effective coping behaviors will improve  Medication Management: Evaluate patient's response, side effects, and tolerance of medication regimen.  Therapeutic Interventions: 1 to 1 sessions, Unit Group sessions and Medication administration.  Evaluation of Outcomes: Progressing  Physician Treatment Plan for Secondary Diagnosis: Principal Problem:   Schizoaffective disorder, depressive type (HCC)  Long Term Goal(s): Improvement in symptoms so as ready for discharge   Short Term Goals: Compliance with  prescribed medications will improve Ability to verbalize feelings will improve Ability to demonstrate self-control will improve Ability to identify and develop effective coping behaviors will improve     Medication Management: Evaluate patient's response, side effects, and tolerance of medication regimen.  Therapeutic Interventions: 1 to 1 sessions, Unit Group sessions and Medication administration.  Evaluation of Outcomes: Progressing   RN Treatment Plan for Primary Diagnosis: Schizoaffective disorder, depressive type (HCC) Long Term Goal(s): Knowledge of disease and therapeutic regimen to maintain health will improve  Short Term Goals: Ability to demonstrate self-control, Ability to participate in decision making will improve, Ability to verbalize feelings will improve, Ability to disclose and discuss suicidal ideas, Ability to identify and develop effective coping behaviors will improve, and Compliance with prescribed medications will improve  Medication Management: RN will administer medications as ordered by provider, will assess and evaluate patient's response and provide education to patient for prescribed medication. RN will report any adverse and/or side effects to prescribing provider.  Therapeutic Interventions: 1 on 1 counseling sessions, Psychoeducation, Medication administration, Evaluate responses to treatment, Monitor vital signs and CBGs as ordered, Perform/monitor CIWA, COWS, AIMS and Fall Risk screenings as ordered, Perform wound care treatments as ordered.  Evaluation of Outcomes: Progressing   LCSW Treatment Plan for Primary Diagnosis: Schizoaffective disorder, depressive type (HCC) Long Term Goal(s): Safe transition to appropriate next level of care at discharge, Engage patient in therapeutic group addressing interpersonal concerns.  Short Term Goals: Engage patient in aftercare planning with referrals and resources, Increase social support, Increase ability to  appropriately verbalize feelings, Increase emotional regulation, Facilitate acceptance of mental health diagnosis and concerns, and Increase skills for wellness and recovery  Therapeutic Interventions: Assess for all discharge needs, 1 to 1 time with Social worker, Explore available resources and support systems, Assess for adequacy in community support network, Educate family and significant other(s) on suicide prevention, Complete Psychosocial Assessment, Interpersonal group therapy.  Evaluation of Outcomes: Progressing   Progress in Treatment: Attending groups: No. Participating in groups: No. Taking medication as prescribed: Yes. Toleration medication: Yes. Family/Significant other contact made: Yes, individual(s) contacted:  SPE completed with the patients legal guardian/sister Patient understands diagnosis: No. Discussing patient identified problems/goals with staff: Yes. Medical problems stabilized or resolved: Yes. Denies suicidal/homicidal ideation: Yes. Issues/concerns per patient self-inventory: No. Other: none  New problem(s) identified: No, Describe:  none  Update 06/29/2022: No changes at this time. Update 07/04/2022: No changes at this time. Update 07/09/22: No changes at this time. Update 07/14/22: No changes at this time.  Update 07/19/2022:   No changes at this time.    New Short Term/Long Term Goal(s): Patient to work towards elimination of symptoms of psychosis, medication management for mood stabilization; elimination of SI thoughts; development of comprehensive mental wellness plan.  Update 06/29/2022: No changes at this time. Update 07/04/2022: No changes at this time. Update 07/09/22: No changes at this time. Update  07/14/22: No changes at this time.   Update 07/19/2022:   No changes at this time.    Patient Goals:  No additional goals identified at this time. Patient to continue to work towards original goals identified in initial treatment team meeting. CSW will  remain available to patient should they voice additional treatment goals. Update 06/29/2022: No changes at this time. Update 07/04/2022: No changes at this time. Update 07/09/22: No changes at this time. Update 07/14/22: No changes at this time.   Update 07/19/2022:   No changes at this time.    Discharge Plan or Barriers: Patient lacks adequate housing/supervision at discharge. Patient's guardian refuses to pick patient up from hospital out of safety concerns. CSW team continuing to work with legal guardian to attempt to resolve concerns and identify adequate discharge plan. Update 06/29/2022: Guardian is seeking special assistance medicaid.  She has not completed the paperwork at last update.  Patient remains on the unit and safe, however, no plans have been developed for placement at discharge.  Patient lacks funding for group home, and can not afford out of pocket costs associated with Assisted Living. Update 07/04/2022: Currently seeking ALF/Group Home placement. [facility] reviewing for potential placement. Update 07/09/22: No changes at this time. Update 07/14/22: No changes at this time.   Update 07/19/2022:   No changes at this time.    Reason for Continuation of Hospitalization: Other; describe Patient is psychiatrically clear. Patient is currently boarding.    Estimated Length of Stay: TBD Update 06/29/2022: TBD Update 07/04/2022: No changes at this time. Update 07/09/22: No changes at this time. Update 07/14/22: No changes at this time.   Update 07/19/2022:   No changes at this time.   Last 3 [place] Suicide Severity Risk Score: Flowsheet Row Admission (Current) from 04/08/2022 in [facility] INPATIENT BEHAVIORAL MEDICINE Admission (Discharged) from 10/17/2021 in [facility] BEHAVIORAL MEDICINE Admission (Discharged) from 10/02/2021 in [facility] CARDIAC MED PCU  C-SSRS RISK CATEGORY No Risk No Risk No Risk       Last PHQ 2/9 Scores:     No data to display          Scribe for  Treatment Team: Harden Mo, Alexander Mt 07/19/2022 9:35 AM

## 2022-07-19 NOTE — [facility] Group Notes (Signed)
[facility] Group Notes:  (Nursing/MHT/Case Management/Adjunct)  Date:  07/19/2022  Time:  8:38 PM  Type of Therapy:   Wrap up  Participation Level:  Did Not Attend  Summary of Progress/Problems:  [name] 07/19/2022, 8:38 PM

## 2022-07-19 NOTE — Plan of Care (Signed)
  Problem: Education: Goal: Knowledge of General Education information will improve Description: Including pain rating scale, medication(s)/side effects and non-pharmacologic comfort measures Outcome: Progressing   Problem: Health Behavior/Discharge Planning: Goal: Ability to manage health-related needs will improve Outcome: Progressing   Problem: Clinical Measurements: Goal: Ability to maintain clinical measurements within normal limits will improve Outcome: Progressing Goal: Will remain free from infection Outcome: Progressing Goal: Diagnostic test results will improve Outcome: Progressing Goal: Respiratory complications will improve Outcome: Progressing Goal: Cardiovascular complication will be avoided Outcome: Progressing   Problem: Safety: Goal: Periods of time without injury will increase Outcome: Progressing   Problem: Physical Regulation: Goal: Ability to maintain clinical measurements within normal limits will improve Outcome: Progressing   Problem: Health Behavior/Discharge Planning: Goal: Identification of resources available to assist in meeting health care needs will improve Outcome: Progressing Goal: Compliance with treatment plan for underlying cause of condition will improve Outcome: Progressing   

## 2022-07-20 DIAGNOSIS — F251 Schizoaffective disorder, depressive type: Secondary | ICD-10-CM | POA: Diagnosis not present

## 2022-07-20 NOTE — Progress Notes (Signed)
[facility] MD Progress Note  07/20/2022 11:33 AM [name]  MRN:  [id number] Subjective: Follow-up with this patient with schizoaffective disorder.  No change in presentation.  Stays in her room most of the time.  Does not have much to say.  Gets irritable when you try and bring up any kind of discharge planning.  Not self-harming.  Not aggressive to others.  Mainly a placement problem. Principal Problem: Schizoaffective disorder, depressive type ([place]) Diagnosis: Principal Problem:   Schizoaffective disorder, depressive type ([place])  Total Time spent with patient: 30 minutes  Past Psychiatric History: Past history of schizoaffective disorder  Past Medical History:  Past Medical History:  Diagnosis Date   Anemia 10/02/2021   Non compliance w medication regimen    Schizophrenia [facility])     Past Surgical History:  Procedure Laterality Date   BIOPSY  09/25/2021   Procedure: BIOPSY;  Surgeon: Milus Banister, MD;  Location: [facility];  Service: Gastroenterology;;   ESOPHAGOGASTRODUODENOSCOPY (EGD) WITH PROPOFOL [name] 09/25/2021   Procedure: ESOPHAGOGASTRODUODENOSCOPY (EGD) WITH PROPOFOL;  Surgeon: Milus Banister, MD;  Location: [facility];  Service: Gastroenterology;  Laterality: [name];  With NGT placement   Family History: History reviewed. No pertinent family history. Family Psychiatric  History: See previous Social History:  Social History   Substance and Sexual Activity  Alcohol Use Not Currently   Comment: Refuses to disclose how much     Social History   Substance and Sexual Activity  Drug Use No   Comment: Refuses to answer    Social History   Socioeconomic History   Marital status: Single    Spouse name: Not on file   Number of children: Not on file   Years of education: Not on file   Highest education level: Not on file  Occupational History   Not on file  Tobacco Use   Smoking status: Every Day    Packs/day: 2.00    Types: Cigarettes   Smokeless tobacco: Never   Substance and Sexual Activity   Alcohol use: Not Currently    Comment: Refuses to disclose how much   Drug use: No    Comment: Refuses to answer   Sexual activity: Not Currently    Comment: refused to answer  Other Topics Concern   Not on file  Social History Narrative   Not on file   Social Determinants of Health   Financial Resource Strain: Not on file  Food Insecurity: No Food Insecurity (04/08/2022)   Hunger Vital Sign    Worried About Running Out of Food in the Last Year: Never true    Ran Out of Food in the Last Year: Never true  Transportation Needs: No Transportation Needs (04/08/2022)   PRAPARE - Hydrologist (Medical): No    Lack of Transportation (Non-Medical): No  Physical Activity: Not on file  Stress: Not on file  Social Connections: Not on file   Additional Social History:                         Sleep: Fair  Appetite:  Fair  Current Medications: Current Facility-Administered Medications  Medication Dose Route Frequency Provider Last Rate Last Admin   acetaminophen (TYLENOL) tablet 650 mg  650 mg Oral Q6H PRN Yuvonne Lanahan T, MD       alum & mag hydroxide-simeth (MAALOX/MYLANTA) 200-200-20 MG/5ML suspension 30 mL  30 mL Oral Q4H PRN Sewell Pitner, [name], MD  haloperidol (HALDOL) tablet 5 mg  5 mg Oral BID Lizza Huffaker T, MD   5 mg at 07/20/22 [id number]   Or   haloperidol lactate (HALDOL) injection 5 mg  5 mg Intramuscular BID Chalise Pe T, MD       lithium carbonate capsule 600 mg  600 mg Oral QHS Encarnacion Scioneaux T, MD   600 mg at 07/19/22 2112   LORazepam (ATIVAN) tablet 1 mg  1 mg Oral BID Caileen Veracruz T, MD   1 mg at 07/20/22 [id number]   Or   LORazepam (ATIVAN) injection 1 mg  1 mg Intramuscular BID Dorathea Faerber T, MD       magnesium hydroxide (MILK OF MAGNESIA) suspension 30 mL  30 mL Oral Daily PRN Anabell Swint T, MD       mirtazapine (REMERON) tablet 30 mg  30 mg Oral QHS Milderd Manocchio T, MD   30 mg at 07/19/22 2112    multivitamin with minerals tablet 1 tablet  1 tablet Oral Daily Clinton Wahlberg, [name], MD   1 tablet at 07/20/22 [id number]   QUEtiapine (SEROQUEL) tablet 400 mg  400 mg Oral QHS Shelbie Franken T, MD   400 mg at 07/19/22 2112   thiamine (VITAMIN B1) tablet 100 mg  100 mg Oral Daily Yona Stansbury, [name], MD   100 mg at 07/20/22 [id number]    Lab Results: No results found for this or any previous visit (from the past 63 hour(s)).  Blood Alcohol level:  Lab Results  Component Value Date   ETH <10 03/29/2022   ETH <10 [id number]    Metabolic Disorder Labs: Lab Results  Component Value Date   HGBA1C 5.6 10/21/2021   MPG 114 10/21/2021   MPG 111 10/02/2021   Lab Results  Component Value Date   PROLACTIN 13.6 04/16/2015   Lab Results  Component Value Date   CHOL 189 10/21/2021   TRIG 52 10/21/2021   HDL 50 10/21/2021   CHOLHDL 3.8 10/21/2021   VLDL 10 10/21/2021   LDLCALC 129 (H) 10/21/2021   LDLCALC 93 10/01/2020    Physical Findings: AIMS: Facial and Oral Movements Muscles of Facial Expression: None, normal Lips and Perioral Area: None, normal Jaw: None, normal Tongue: None, normal,Extremity Movements Upper (arms, wrists, hands, fingers): None, normal Lower (legs, knees, ankles, toes): None, normal, Trunk Movements Neck, shoulders, hips: None, normal, Overall Severity Severity of abnormal movements (highest score from questions above): None, normal Incapacitation due to abnormal movements: None, normal Patient's awareness of abnormal movements (rate only patient's report): No Awareness, Dental Status Current problems with teeth and/or dentures?: No Does patient usually wear dentures?: No  CIWA:    COWS:     Musculoskeletal: Strength & Muscle Tone: within normal limits Gait & Station: normal Patient leans: [name]  Psychiatric Specialty Exam:  Presentation  General Appearance:  Disheveled  Eye Contact: Fair  Speech: Pressured  Speech  Volume: Increased  Handedness: Right   Mood and Affect  Mood: Irritable; Labile; Angry  Affect: Blunt; Labile   Thought Process  Thought Processes: Disorganized; Irrevelant  Descriptions of Associations:Loose  Orientation:Partial  Thought Content:Illogical; Rumination; Scattered; Tangential; Delusions  History of Schizophrenia/Schizoaffective disorder:Yes  Duration of Psychotic Symptoms:Greater than six months  Hallucinations:No data recorded Ideas of Reference:Percusatory; Paranoia  Suicidal Thoughts:No data recorded Homicidal Thoughts:No data recorded  Sensorium  Memory: Immediate Fair; Recent Fair; Remote Fair  Judgment: Impaired  Insight: Poor; Lacking   Executive Functions  Concentration: Poor  Attention Span: Poor  Recall:  Poor  Fund of Knowledge: Poor  Language: Poor   Psychomotor Activity  Psychomotor Activity:No data recorded  Assets  Assets: Physical Health; Resilience; Housing; Social Support   Sleep  Sleep:No data recorded   Physical Exam: Physical Exam Vitals reviewed.  Constitutional:      Appearance: Normal appearance.  HENT:     Head: Normocephalic and atraumatic.     Mouth/Throat:     Pharynx: Oropharynx is clear.  Eyes:     Pupils: Pupils are equal, round, and reactive to light.  Cardiovascular:     Rate and Rhythm: Normal rate and regular rhythm.  Pulmonary:     Effort: Pulmonary effort is normal.     Breath sounds: Normal breath sounds.  Abdominal:     General: Abdomen is flat.     Palpations: Abdomen is soft.  Musculoskeletal:        General: Normal range of motion.  Skin:    General: Skin is warm and dry.  Neurological:     General: No focal deficit present.     Mental Status: She is alert. Mental status is at baseline.  Psychiatric:        Attention and Perception: She is inattentive.        Mood and Affect: Mood normal. Affect is blunt.        Speech: Speech is delayed.        Thought  Content: Thought content is delusional.        Cognition and Memory: Cognition is impaired.    Review of Systems  Constitutional: Negative.   HENT: Negative.    Eyes: Negative.   Respiratory: Negative.    Cardiovascular: Negative.   Gastrointestinal: Negative.   Musculoskeletal: Negative.   Skin: Negative.   Neurological: Negative.   Psychiatric/Behavioral: Negative.     Blood pressure 113/69, pulse (!) 59, temperature 98.1 F (36.7 C), temperature source Oral, resp. rate 18, height 5\' 4"  (1.626 m), weight 40.4 kg, SpO2 100 %. Body mass index is 15.28 kg/m.   Treatment Plan Summary: Medication management and Plan continue current medication.  Patient remains stably psychotic and resistant to other treatment options.  Placement planning is still underway.  Alethia Berthold, MD 07/20/2022, 11:33 AM

## 2022-07-20 NOTE — Progress Notes (Signed)
D- Patient alert and oriented x 3-4. Affect flat/mood depressed, but denies. Denies SI/ HI/ AVH. She denies pain. She states that "discharge" is her goal. A- Scheduled medications administered to patient, per MD orders. Support and encouragement provided.  Routine safety checks conducted every 15 minutes without incident. Patient informed to notify staff with problems or concerns. R- No adverse drug reactions noted. Patient compliant with medications and treatment plan. Patient receptive, calm, and cooperative. She interacts well with others. Patient contracts for safety and remains safe on the unit.

## 2022-07-20 NOTE — Progress Notes (Signed)
   07/20/22 2200  Psych Admission Type (Psych Patients Only)  Admission Status Involuntary  Psychosocial Assessment  Patient Complaints Depression  Eye Contact Fair  Facial Expression Flat  Affect Depressed  Speech Logical/coherent  Interaction Assertive  Motor Activity Slow  Appearance/Hygiene In scrubs  Behavior Characteristics Appropriate to situation;Calm  Aggressive Behavior  Effect No apparent injury  Thought Process  Coherency WDL  Content WDL  Delusions None reported or observed  Perception UTA  Hallucination None reported or observed  Judgment WDL  Confusion None  Danger to Self  Current suicidal ideation? Denies  Agreement Not to Harm Self Yes  Description of Agreement verbally contracts for safety  Danger to Others  Danger to Others None reported or observed

## 2022-07-20 NOTE — Plan of Care (Signed)
  Problem: Education: Goal: Knowledge of General Education information will improve Description: Including pain rating scale, medication(s)/side effects and non-pharmacologic comfort measures Outcome: Progressing   Problem: Health Behavior/Discharge Planning: Goal: Ability to manage health-related needs will improve Outcome: Progressing   Problem: Clinical Measurements: Goal: Ability to maintain clinical measurements within normal limits will improve Outcome: Progressing Goal: Will remain free from infection Outcome: Progressing Goal: Diagnostic test results will improve Outcome: Progressing Goal: Respiratory complications will improve Outcome: Progressing Goal: Cardiovascular complication will be avoided Outcome: Progressing   Problem: Activity: Goal: Risk for activity intolerance will decrease Outcome: Progressing   Problem: Nutrition: Goal: Adequate nutrition will be maintained Outcome: Progressing   Problem: Coping: Goal: Level of anxiety will decrease Outcome: Progressing   Problem: Elimination: Goal: Will not experience complications related to bowel motility Outcome: Progressing Goal: Will not experience complications related to urinary retention Outcome: Progressing   Problem: Pain Managment: Goal: General experience of comfort will improve Outcome: Progressing   Problem: Safety: Goal: Ability to remain free from injury will improve Outcome: Progressing   Problem: Skin Integrity: Goal: Risk for impaired skin integrity will decrease Outcome: Progressing   Problem: Activity: Goal: Will verbalize the importance of balancing activity with adequate rest periods Outcome: Progressing   Problem: Education: Goal: Will be free of psychotic symptoms Outcome: Progressing Goal: Knowledge of the prescribed therapeutic regimen will improve Outcome: Progressing   Problem: Coping: Goal: Coping ability will improve Outcome: Progressing Goal: Will verbalize  feelings Outcome: Progressing   Problem: Health Behavior/Discharge Planning: Goal: Compliance with prescribed medication regimen will improve Outcome: Progressing   Problem: Nutritional: Goal: Ability to achieve adequate nutritional intake will improve Outcome: Progressing   Problem: Role Relationship: Goal: Ability to communicate needs accurately will improve Outcome: Progressing Goal: Ability to interact with others will improve Outcome: Progressing   Problem: Safety: Goal: Ability to redirect hostility and anger into socially appropriate behaviors will improve Outcome: Progressing Goal: Ability to remain free from injury will improve Outcome: Progressing   Problem: Self-Care: Goal: Ability to participate in self-care as condition permits will improve Outcome: Progressing   Problem: Self-Concept: Goal: Will verbalize positive feelings about self Outcome: Progressing   Problem: Education: Goal: Knowledge of [place] [facility] Education information/materials will improve Outcome: Progressing Goal: Emotional status will improve Outcome: Progressing Goal: Mental status will improve Outcome: Progressing Goal: Verbalization of understanding the information provided will improve Outcome: Progressing   Problem: Activity: Goal: Interest or engagement in activities will improve Outcome: Progressing Goal: Sleeping patterns will improve Outcome: Progressing   Problem: Coping: Goal: Ability to verbalize frustrations and anger appropriately will improve Outcome: Progressing Goal: Ability to demonstrate self-control will improve Outcome: Progressing   Problem: Health Behavior/Discharge Planning: Goal: Identification of resources available to assist in meeting health care needs will improve Outcome: Progressing Goal: Compliance with treatment plan for underlying cause of condition will improve Outcome: Progressing   Problem: Physical Regulation: Goal: Ability to  maintain clinical measurements within normal limits will improve Outcome: Progressing   Problem: Safety: Goal: Periods of time without injury will increase Outcome: Progressing   

## 2022-07-21 DIAGNOSIS — F251 Schizoaffective disorder, depressive type: Secondary | ICD-10-CM | POA: Diagnosis not present

## 2022-07-21 NOTE — [facility] Counselor (Signed)
CSW called the patient's sister/guardian.  CSW followed up on if a Medicaid number has been provided.  Guardian reports that one was not, however she will call to see if she can obtain one.    CSW followed up on guardian's group home search.  Guardian reports that she "stopped calling because I was told that there was a blanket form that you all would send out".    CSW explained that [facility] and willing to consider patient and refer her to their sister facilities if Memory Care was the only option. Guardian reports that Medicaid was approved for memory only, however, she will follow up when she calls.   Guardian was agreeable to meeting on 07/23/22 at [facility] with treatment team.  Assunta Curtis, MSW, LCSW 07/21/2022 11:46 AM

## 2022-07-21 NOTE — Group Note (Signed)
[facility] LCSW Group Therapy Note    Group Date: 07/21/2022 Start Time: 1300 End Time: 1400  Type of Therapy and Topic:  Group Therapy:  Overcoming Obstacles  Participation Level:  [facility] PARTICIPATION LEVEL: Did Not Attend  Mood:  Description of Group:   In this group patients will be encouraged to explore what they see as obstacles to their own wellness and recovery. They will be guided to discuss their thoughts, feelings, and behaviors related to these obstacles. The group will process together ways to cope with barriers, with attention given to specific choices patients can make. Each patient will be challenged to identify changes they are motivated to make in order to overcome their obstacles. This group will be process-oriented, with patients participating in exploration of their own experiences as well as giving and receiving support and challenge from other group members.  Therapeutic Goals: 1. Patient will identify personal and current obstacles as they relate to admission. 2. Patient will identify barriers that currently interfere with their wellness or overcoming obstacles.  3. Patient will identify feelings, thought process and behaviors related to these barriers. 4. Patient will identify two changes they are willing to make to overcome these obstacles:    Summary of Patient Progress Patient did not attend group, though encouraged to do so by this clinician.    Therapeutic Modalities:   Cognitive Behavioral Therapy Solution Focused Therapy Motivational Interviewing Relapse Prevention Therapy   Tishara Pizano J Kevonna Nolte, LCSW 

## 2022-07-21 NOTE — Progress Notes (Signed)
Recreation Therapy Notes   Date: 07/21/2022  Time: 10:00 am  Location: Craft room     Behavioral response: [name]   Intervention Topic: Teamwork    Discussion/Intervention: Patient refused to attend group.   Clinical Observations/Feedback:  Patient refused to attend group.    Jaki Steptoe LRT/CTRS        Jquan Egelston 07/21/2022 10:42 AM

## 2022-07-21 NOTE — [facility] Group Notes (Signed)
[facility] Group Notes:  (Nursing/MHT/Case Management/Adjunct)  Date:  07/21/2022  Time:  9:19 AM  Type of Therapy:   Community Meeting  Participation Level:  Did Not Attend   Adela Lank [facility] 07/21/2022, 9:19 AM

## 2022-07-21 NOTE — Progress Notes (Signed)
[facility] MD Progress Note  07/21/2022 4:26 PM [name]  MRN:  [id number] Subjective: Follow-up 62 year old woman with schizoaffective disorder.  No change to complaints.  Really has no complaints except to request discharge but then proved to be unable to converse about discharge planning.  Remains paranoid and disorganized. Principal Problem: Schizoaffective disorder, depressive type (HCC) Diagnosis: Principal Problem:   Schizoaffective disorder, depressive type (HCC)  Total Time spent with patient: 30 minutes  Past Psychiatric History: Past history of schizoaffective disorder  Past Medical History:  Past Medical History:  Diagnosis Date   Anemia 10/02/2021   Non compliance w medication regimen    Schizophrenia [facility])     Past Surgical History:  Procedure Laterality Date   BIOPSY  09/25/2021   Procedure: BIOPSY;  Surgeon: Rachael Fee, MD;  Location: [facility];  Service: Gastroenterology;;   ESOPHAGOGASTRODUODENOSCOPY (EGD) WITH PROPOFOL [name] 09/25/2021   Procedure: ESOPHAGOGASTRODUODENOSCOPY (EGD) WITH PROPOFOL;  Surgeon: Rachael Fee, MD;  Location: [facility];  Service: Gastroenterology;  Laterality: [name];  With NGT placement   Family History: History reviewed. No pertinent family history. Family Psychiatric  History: See previous Social History:  Social History   Substance and Sexual Activity  Alcohol Use Not Currently   Comment: Refuses to disclose how much     Social History   Substance and Sexual Activity  Drug Use No   Comment: Refuses to answer    Social History   Socioeconomic History   Marital status: Single    Spouse name: Not on file   Number of children: Not on file   Years of education: Not on file   Highest education level: Not on file  Occupational History   Not on file  Tobacco Use   Smoking status: Every Day    Packs/day: 2.00    Types: Cigarettes   Smokeless tobacco: Never  Substance and Sexual Activity   Alcohol use: Not Currently     Comment: Refuses to disclose how much   Drug use: No    Comment: Refuses to answer   Sexual activity: Not Currently    Comment: refused to answer  Other Topics Concern   Not on file  Social History Narrative   Not on file   Social Determinants of Health   Financial Resource Strain: Not on file  Food Insecurity: No Food Insecurity (04/08/2022)   Hunger Vital Sign    Worried About Running Out of Food in the Last Year: Never true    Ran Out of Food in the Last Year: Never true  Transportation Needs: No Transportation Needs (04/08/2022)   PRAPARE - Administrator, Civil Service (Medical): No    Lack of Transportation (Non-Medical): No  Physical Activity: Not on file  Stress: Not on file  Social Connections: Not on file   Additional Social History:                         Sleep: Fair  Appetite:  Fair  Current Medications: Current Facility-Administered Medications  Medication Dose Route Frequency Provider Last Rate Last Admin   acetaminophen (TYLENOL) tablet 650 mg  650 mg Oral Q6H PRN Jocilynn Grade T, MD       alum & mag hydroxide-simeth (MAALOX/MYLANTA) 200-200-20 MG/5ML suspension 30 mL  30 mL Oral Q4H PRN Deva Ron T, MD       haloperidol (HALDOL) tablet 5 mg  5 mg Oral BID Kuuipo Anzaldo, [name], MD   5  mg at 07/21/22 2951   Or   haloperidol lactate (HALDOL) injection 5 mg  5 mg Intramuscular BID Palin Tristan T, MD       lithium carbonate capsule 600 mg  600 mg Oral QHS Cullin Dishman T, MD   600 mg at 07/20/22 2027   LORazepam (ATIVAN) tablet 1 mg  1 mg Oral BID Jayde Daffin T, MD   1 mg at 07/21/22 8841   Or   LORazepam (ATIVAN) injection 1 mg  1 mg Intramuscular BID Yanessa Hocevar T, MD       magnesium hydroxide (MILK OF MAGNESIA) suspension 30 mL  30 mL Oral Daily PRN Treavor Blomquist T, MD       mirtazapine (REMERON) tablet 30 mg  30 mg Oral QHS Sayge Brienza T, MD   30 mg at 07/20/22 2027   multivitamin with minerals tablet 1 tablet  1 tablet Oral Daily  Sosaia Pittinger, [name], MD   1 tablet at 07/21/22 6606   QUEtiapine (SEROQUEL) tablet 400 mg  400 mg Oral QHS Parley Pidcock T, MD   400 mg at 07/20/22 2027   thiamine (VITAMIN B1) tablet 100 mg  100 mg Oral Daily Aizza Santiago, [name], MD   100 mg at 07/21/22 3016    Lab Results: No results found for this or any previous visit (from the past 48 hour(s)).  Blood Alcohol level:  Lab Results  Component Value Date   ETH <10 03/29/2022   ETH <10 07/28/3233    Metabolic Disorder Labs: Lab Results  Component Value Date   HGBA1C 5.6 10/21/2021   MPG 114 10/21/2021   MPG 111 10/02/2021   Lab Results  Component Value Date   PROLACTIN 13.6 04/16/2015   Lab Results  Component Value Date   CHOL 189 10/21/2021   TRIG 52 10/21/2021   HDL 50 10/21/2021   CHOLHDL 3.8 10/21/2021   VLDL 10 10/21/2021   LDLCALC 129 (H) 10/21/2021   LDLCALC 93 10/01/2020    Physical Findings: AIMS: Facial and Oral Movements Muscles of Facial Expression: None, normal Lips and Perioral Area: None, normal Jaw: None, normal Tongue: None, normal,Extremity Movements Upper (arms, wrists, hands, fingers): None, normal Lower (legs, knees, ankles, toes): None, normal, Trunk Movements Neck, shoulders, hips: None, normal, Overall Severity Severity of abnormal movements (highest score from questions above): None, normal Incapacitation due to abnormal movements: None, normal Patient's awareness of abnormal movements (rate only patient's report): No Awareness, Dental Status Current problems with teeth and/or dentures?: No Does patient usually wear dentures?: No  CIWA:    COWS:     Musculoskeletal: Strength & Muscle Tone: within normal limits Gait & Station: normal Patient leans: [name]  Psychiatric Specialty Exam:  Presentation  General Appearance:  Disheveled  Eye Contact: Fair  Speech: Pressured  Speech Volume: Increased  Handedness: Right   Mood and Affect  Mood: Irritable; Labile;  Angry  Affect: Blunt; Labile   Thought Process  Thought Processes: Disorganized; Irrevelant  Descriptions of Associations:Loose  Orientation:Partial  Thought Content:Illogical; Rumination; Scattered; Tangential; Delusions  History of Schizophrenia/Schizoaffective disorder:Yes  Duration of Psychotic Symptoms:Greater than six months  Hallucinations:No data recorded Ideas of Reference:Percusatory; Paranoia  Suicidal Thoughts:No data recorded Homicidal Thoughts:No data recorded  Sensorium  Memory: Immediate Fair; Recent Fair; Remote Fair  Judgment: Impaired  Insight: Poor; Lacking   Executive Functions  Concentration: Poor  Attention Span: Poor  Recall: Poor  Fund of Knowledge: Poor  Language: Poor   Psychomotor Activity  Psychomotor Activity:No data  recorded  Assets  Assets: Physical Health; Resilience; Housing; Social Support   Sleep  Sleep:No data recorded   Physical Exam: Physical Exam Vitals and nursing note reviewed.  Constitutional:      Appearance: Normal appearance.  HENT:     Head: Normocephalic and atraumatic.     Mouth/Throat:     Pharynx: Oropharynx is clear.  Eyes:     Pupils: Pupils are equal, round, and reactive to light.  Cardiovascular:     Rate and Rhythm: Normal rate and regular rhythm.  Pulmonary:     Effort: Pulmonary effort is normal.     Breath sounds: Normal breath sounds.  Abdominal:     General: Abdomen is flat.     Palpations: Abdomen is soft.  Musculoskeletal:        General: Normal range of motion.  Skin:    General: Skin is warm and dry.  Neurological:     General: No focal deficit present.     Mental Status: She is alert. Mental status is at baseline.  Psychiatric:        Attention and Perception: She is inattentive.        Mood and Affect: Mood normal. Affect is blunt.        Speech: She is noncommunicative.        Behavior: Behavior is slowed.        Thought Content: Thought content normal.     Review of Systems  Constitutional: Negative.   HENT: Negative.    Eyes: Negative.   Respiratory: Negative.    Cardiovascular: Negative.   Gastrointestinal: Negative.   Musculoskeletal: Negative.   Skin: Negative.   Neurological: Negative.   Psychiatric/Behavioral: Negative.     Blood pressure (!) 108/48, pulse 67, temperature 98 F (36.7 C), temperature source Oral, resp. rate 16, height 5\' 4"  (1.626 m), weight 40.4 kg, SpO2 100 %. Body mass index is 15.28 kg/m.   Treatment Plan Summary: Plan patient has a good already and is certainly unable to take care of herself.  Guardian has been unable to manage the patient at home because of the patient's dangerous behavior around her mother.  Work has been in progress for placement for quite a while.  Unclear when we will get an answer.  Alethia Berthold, MD 07/21/2022, 4:26 PM

## 2022-07-21 NOTE — Plan of Care (Signed)
Patient verbalized no issues. Denies SI,HI and AVH.Isolated to her room. Appetite and energy level good. Support and encouragement given.

## 2022-07-22 DIAGNOSIS — F251 Schizoaffective disorder, depressive type: Secondary | ICD-10-CM | POA: Diagnosis not present

## 2022-07-22 NOTE — Plan of Care (Signed)
  Problem: Nutrition: Goal: Adequate nutrition will be maintained Outcome: Progressing   Problem: Coping: Goal: Level of anxiety will decrease Outcome: Progressing   Problem: Elimination: Goal: Will not experience complications related to bowel motility Outcome: Progressing   

## 2022-07-22 NOTE — Group Note (Signed)
[facility] L[facility]W Group Therapy Note   Group Date: 07/22/2022 Start Time: 1300 End Time: 1400   Type of Therapy/Topic:  Group Therapy:  Emotion Regulation  Participation Level:  Did Not Attend    Description of Group:    The purpose of this group is to assist patients in learning to regulate negative emotions and experience positive emotions. Patients will be guided to discuss ways in which they have been vulnerable to their negative emotions. These vulnerabilities will be juxtaposed with experiences of positive emotions or situations, and patients challenged to use positive emotions to combat negative ones. Special emphasis will be placed on coping with negative emotions in conflict situations, and patients will process healthy conflict resolution skills.  Therapeutic Goals: Patient will identify two positive emotions or experiences to reflect on in order to balance out negative emotions:  Patient will label two or more emotions that they find the most difficult to experience:  Patient will be able to demonstrate positive conflict resolution skills through discussion or role plays:   Summary of Patient Progress: X   Therapeutic Modalities:   Cognitive Behavioral Therapy Feelings Identification Dialectical Behavioral Therapy   Shirl Harris, LCSW

## 2022-07-22 NOTE — Progress Notes (Signed)
[facility] MD Progress Note  07/22/2022 11:07 AM [name]  MRN:  [id number] Subjective: Follow-up patient with schizoaffective disorder.  Remains withdrawn and isolated most of the time.  No new complaints.  Gets irritated whenever she is engaged in conversation especially if family is mentioned.  Unable to tolerate higher doses of medicine Principal Problem: Schizoaffective disorder, depressive type ([place]) Diagnosis: Principal Problem:   Schizoaffective disorder, depressive type ([place])  Total Time spent with patient: 30 minutes  Past Psychiatric History: Longstanding psychotic disorder  Past Medical History:  Past Medical History:  Diagnosis Date   Anemia 10/02/2021   Non compliance w medication regimen    Schizophrenia [facility])     Past Surgical History:  Procedure Laterality Date   BIOPSY  09/25/2021   Procedure: BIOPSY;  Surgeon: Milus Banister, MD;  Location: [facility];  Service: Gastroenterology;;   ESOPHAGOGASTRODUODENOSCOPY (EGD) WITH PROPOFOL [name] 09/25/2021   Procedure: ESOPHAGOGASTRODUODENOSCOPY (EGD) WITH PROPOFOL;  Surgeon: Milus Banister, MD;  Location: [facility];  Service: Gastroenterology;  Laterality: [name];  With NGT placement   Family History: History reviewed. No pertinent family history. Family Psychiatric  History: See previous Social History:  Social History   Substance and Sexual Activity  Alcohol Use Not Currently   Comment: Refuses to disclose how much     Social History   Substance and Sexual Activity  Drug Use No   Comment: Refuses to answer    Social History   Socioeconomic History   Marital status: Single    Spouse name: Not on file   Number of children: Not on file   Years of education: Not on file   Highest education level: Not on file  Occupational History   Not on file  Tobacco Use   Smoking status: Every Day    Packs/day: 2.00    Types: Cigarettes   Smokeless tobacco: Never  Substance and Sexual Activity   Alcohol use: Not Currently     Comment: Refuses to disclose how much   Drug use: No    Comment: Refuses to answer   Sexual activity: Not Currently    Comment: refused to answer  Other Topics Concern   Not on file  Social History Narrative   Not on file   Social Determinants of Health   Financial Resource Strain: Not on file  Food Insecurity: No Food Insecurity (04/08/2022)   Hunger Vital Sign    Worried About Running Out of Food in the Last Year: Never true    Ran Out of Food in the Last Year: Never true  Transportation Needs: No Transportation Needs (04/08/2022)   PRAPARE - Hydrologist (Medical): No    Lack of Transportation (Non-Medical): No  Physical Activity: Not on file  Stress: Not on file  Social Connections: Not on file   Additional Social History:                         Sleep: Fair  Appetite:  Fair  Current Medications: Current Facility-Administered Medications  Medication Dose Route Frequency Provider Last Rate Last Admin   acetaminophen (TYLENOL) tablet 650 mg  650 mg Oral Q6H PRN Shavonda Wiedman T, MD       alum & mag hydroxide-simeth (MAALOX/MYLANTA) 200-200-20 MG/5ML suspension 30 mL  30 mL Oral Q4H PRN Lourene Hoston T, MD       haloperidol (HALDOL) tablet 5 mg  5 mg Oral BID Rick Warnick, [name], MD  5 mg at 07/22/22 1950   Or   haloperidol lactate (HALDOL) injection 5 mg  5 mg Intramuscular BID Ruhama Lehew T, MD       lithium carbonate capsule 600 mg  600 mg Oral QHS Nautika Cressey T, MD   600 mg at 07/21/22 2039   LORazepam (ATIVAN) tablet 1 mg  1 mg Oral BID Freda Jaquith T, MD   1 mg at 07/22/22 9326   Or   LORazepam (ATIVAN) injection 1 mg  1 mg Intramuscular BID Arna Luis T, MD       magnesium hydroxide (MILK OF MAGNESIA) suspension 30 mL  30 mL Oral Daily PRN Berthel Bagnall T, MD       mirtazapine (REMERON) tablet 30 mg  30 mg Oral QHS Gerre Ranum T, MD   30 mg at 07/21/22 2039   multivitamin with minerals tablet 1 tablet  1 tablet Oral  Daily Anagabriela Jokerst, [name], MD   1 tablet at 07/22/22 0813   QUEtiapine (SEROQUEL) tablet 400 mg  400 mg Oral QHS Resha Filippone T, MD   400 mg at 07/21/22 2039   thiamine (VITAMIN B1) tablet 100 mg  100 mg Oral Daily Tevan Marian, [name], MD   100 mg at 07/22/22 0813    Lab Results: No results found for this or any previous visit (from the past 30 hour(s)).  Blood Alcohol level:  Lab Results  Component Value Date   ETH <10 03/29/2022   ETH <10 71/24/5809    Metabolic Disorder Labs: Lab Results  Component Value Date   HGBA1C 5.6 10/21/2021   MPG 114 10/21/2021   MPG 111 10/02/2021   Lab Results  Component Value Date   PROLACTIN 13.6 04/16/2015   Lab Results  Component Value Date   CHOL 189 10/21/2021   TRIG 52 10/21/2021   HDL 50 10/21/2021   CHOLHDL 3.8 10/21/2021   VLDL 10 10/21/2021   LDLCALC 129 (H) 10/21/2021   LDLCALC 93 10/01/2020    Physical Findings: AIMS: Facial and Oral Movements Muscles of Facial Expression: None, normal Lips and Perioral Area: None, normal Jaw: None, normal Tongue: None, normal,Extremity Movements Upper (arms, wrists, hands, fingers): None, normal Lower (legs, knees, ankles, toes): None, normal, Trunk Movements Neck, shoulders, hips: None, normal, Overall Severity Severity of abnormal movements (highest score from questions above): None, normal Incapacitation due to abnormal movements: None, normal Patient's awareness of abnormal movements (rate only patient's report): No Awareness, Dental Status Current problems with teeth and/or dentures?: No Does patient usually wear dentures?: No  CIWA:    COWS:     Musculoskeletal: Strength & Muscle Tone: within normal limits Gait & Station: normal Patient leans: [name]  Psychiatric Specialty Exam:  Presentation  General Appearance:  Disheveled  Eye Contact: Fair  Speech: Pressured  Speech Volume: Increased  Handedness: Right   Mood and Affect  Mood: Irritable; Labile;  Angry  Affect: Blunt; Labile   Thought Process  Thought Processes: Disorganized; Irrevelant  Descriptions of Associations:Loose  Orientation:Partial  Thought Content:Illogical; Rumination; Scattered; Tangential; Delusions  History of Schizophrenia/Schizoaffective disorder:Yes  Duration of Psychotic Symptoms:Greater than six months  Hallucinations:No data recorded Ideas of Reference:Percusatory; Paranoia  Suicidal Thoughts:No data recorded Homicidal Thoughts:No data recorded  Sensorium  Memory: Immediate Fair; Recent Fair; Remote Fair  Judgment: Impaired  Insight: Poor; Lacking   Executive Functions  Concentration: Poor  Attention Span: Poor  Recall: Poor  Fund of Knowledge: Poor  Language: Poor   Psychomotor Activity  Psychomotor Activity:No  data recorded  Assets  Assets: Physical Health; Resilience; Housing; Social Support   Sleep  Sleep:No data recorded   Physical Exam: Physical Exam Vitals and nursing note reviewed.  Constitutional:      Appearance: Normal appearance.  HENT:     Head: Normocephalic and atraumatic.     Mouth/Throat:     Pharynx: Oropharynx is clear.  Eyes:     Pupils: Pupils are equal, round, and reactive to light.  Cardiovascular:     Rate and Rhythm: Normal rate and regular rhythm.  Pulmonary:     Effort: Pulmonary effort is normal.     Breath sounds: Normal breath sounds.  Abdominal:     General: Abdomen is flat.     Palpations: Abdomen is soft.  Musculoskeletal:        General: Normal range of motion.  Skin:    General: Skin is warm and dry.  Neurological:     General: No focal deficit present.     Mental Status: She is alert. Mental status is at baseline.  Psychiatric:        Attention and Perception: She is inattentive.        Mood and Affect: Mood normal. Affect is blunt.        Speech: Speech is delayed.        Behavior: Behavior is withdrawn.        Thought Content: Thought content is  paranoid.    Review of Systems  Constitutional: Negative.   HENT: Negative.    Eyes: Negative.   Respiratory: Negative.    Cardiovascular: Negative.   Gastrointestinal: Negative.   Musculoskeletal: Negative.   Skin: Negative.   Neurological: Negative.   Psychiatric/Behavioral: Negative.     Blood pressure 99/69, pulse 70, temperature 98.4 F (36.9 C), temperature source Oral, resp. rate 16, height 5\' 4"  (1.626 m), weight 40.4 kg, SpO2 100 %. Body mass index is 15.28 kg/m.   Treatment Plan Summary: Medication management and Plan no change to medication plan.  Treatment team will be meeting with her sister soon to discuss discharge options.  , MD 07/22/2022, 11:07 AM

## 2022-07-22 NOTE — Progress Notes (Signed)
Pt denies SI/HI/AVH and verbally agrees to approach staff if these become apparent or before harming themselves/others. Rates depression 0/10. Rates anxiety 0/10. Rates pain 0/10.   Scheduled medications administered to pt, per MD orders. RN provided support and encouragement to pt. Q15 min safety checks implemented and continued. Pt safe on the unit. RN will continue to monitor and intervene as needed.  07/22/22 0900  Psych Admission Type (Psych Patients Only)  Admission Status Involuntary  Psychosocial Assessment  Patient Complaints None  Eye Contact Fair  Facial Expression Flat  Affect Depressed  Speech Logical/coherent  Interaction Isolative  Motor Activity Slow  Appearance/Hygiene In scrubs;Unremarkable  Behavior Characteristics Cooperative;Appropriate to situation;Calm  Mood Depressed  Aggressive Behavior  Effect No apparent injury  Thought Process  Coherency WDL  Content WDL  Delusions None reported or observed  Perception UTA  Hallucination None reported or observed  Judgment Impaired  Confusion None  Danger to Self  Current suicidal ideation? Denies  Danger to Others  Danger to Others None reported or observed

## 2022-07-22 NOTE — [facility] Group Notes (Signed)
[facility] Group Notes:  (Nursing/MHT/Case Management/Adjunct)  Date:  07/22/2022  Time:  10:59 AM  Type of Therapy:   Community Meeting  Participation Level:  Did Not Attend   [name] A [name] 07/22/2022, 10:59 AM 

## 2022-07-23 DIAGNOSIS — F251 Schizoaffective disorder, depressive type: Secondary | ICD-10-CM | POA: Diagnosis not present

## 2022-07-23 NOTE — Progress Notes (Signed)
D- Patient alert and oriented x 3-4. Affect flat /mood depressed. Denies SI/ HI/ AVH. She denies pain. Quotes. She states her goal is "discharge". A- Scheduled medications administered to patient, per MD orders. Support and encouragement provided.  Routine safety checks conducted every 15 minutes without incident.  Patient informed to notify staff with problems or concerns. R- No adverse drug reactions noted. Patient compliant with medications and treatment plan. Patient receptive, calm, and cooperative. She spends most of her time in her room except for meals and med pass. Patient contracts for safety and remains safe on the unit.

## 2022-07-23 NOTE — Progress Notes (Signed)
Recreation Therapy Notes    Date: 07/23/2022  Time: 10:05 am  Location: Craft room     Behavioral response: [name]   Intervention Topic: Time Management    Discussion/Intervention: Patient refused to attend group.   Clinical Observations/Feedback:  Patient refused to attend group.    Jolan Mealor LRT/CTRS        Deja Pisarski 07/23/2022 11:39 AM

## 2022-07-23 NOTE — Progress Notes (Signed)
[facility] MD Progress Note  07/23/2022 10:34 AM [name]  MRN:  [id number] Subjective: Follow-up for this 62 year old woman with schizoaffective disorder.  No change to clinical status.  Mostly stays to herself.  Only becomes animated when attempts are made to discuss discharge planning with her.  Treatment team had a telephone meeting with patient's guardian today.  Guardian continues to feel it is unsafe for the patient to come home because of the risk to her elderly mother.  We discussed possible alternatives.  Unclear if we have any specific path forward. Principal Problem: Schizoaffective disorder, depressive type ([place]) Diagnosis: Principal Problem:   Schizoaffective disorder, depressive type ([place])  Total Time spent with patient: 30 minutes  Past Psychiatric History: Past history of schizoaffective disorder  Past Medical History:  Past Medical History:  Diagnosis Date   Anemia 10/02/2021   Non compliance w medication regimen    Schizophrenia [facility])     Past Surgical History:  Procedure Laterality Date   BIOPSY  09/25/2021   Procedure: BIOPSY;  Surgeon: Milus Banister, MD;  Location: [facility];  Service: Gastroenterology;;   ESOPHAGOGASTRODUODENOSCOPY (EGD) WITH PROPOFOL [name] 09/25/2021   Procedure: ESOPHAGOGASTRODUODENOSCOPY (EGD) WITH PROPOFOL;  Surgeon: Milus Banister, MD;  Location: [facility];  Service: Gastroenterology;  Laterality: [name];  With NGT placement   Family History: History reviewed. No pertinent family history. Family Psychiatric  History: See previous Social History:  Social History   Substance and Sexual Activity  Alcohol Use Not Currently   Comment: Refuses to disclose how much     Social History   Substance and Sexual Activity  Drug Use No   Comment: Refuses to answer    Social History   Socioeconomic History   Marital status: Single    Spouse name: Not on file   Number of children: Not on file   Years of education: Not on file   Highest education  level: Not on file  Occupational History   Not on file  Tobacco Use   Smoking status: Every Day    Packs/day: 2.00    Types: Cigarettes   Smokeless tobacco: Never  Substance and Sexual Activity   Alcohol use: Not Currently    Comment: Refuses to disclose how much   Drug use: No    Comment: Refuses to answer   Sexual activity: Not Currently    Comment: refused to answer  Other Topics Concern   Not on file  Social History Narrative   Not on file   Social Determinants of Health   Financial Resource Strain: Not on file  Food Insecurity: No Food Insecurity (04/08/2022)   Hunger Vital Sign    Worried About Running Out of Food in the Last Year: Never true    Ran Out of Food in the Last Year: Never true  Transportation Needs: No Transportation Needs (04/08/2022)   PRAPARE - Hydrologist (Medical): No    Lack of Transportation (Non-Medical): No  Physical Activity: Not on file  Stress: Not on file  Social Connections: Not on file   Additional Social History:                         Sleep: Fair  Appetite:  Fair  Current Medications: Current Facility-Administered Medications  Medication Dose Route Frequency Provider Last Rate Last Admin   acetaminophen (TYLENOL) tablet 650 mg  650 mg Oral Q6H PRN Mahitha Hickling, [name], MD  alum & mag hydroxide-simeth (MAALOX/MYLANTA) 200-200-20 MG/5ML suspension 30 mL  30 mL Oral Q4H PRN Robena Ewy T, MD       haloperidol (HALDOL) tablet 5 mg  5 mg Oral BID Natasha Paulson T, MD   5 mg at 07/23/22 5956   Or   haloperidol lactate (HALDOL) injection 5 mg  5 mg Intramuscular BID Syrena Burges T, MD       lithium carbonate capsule 600 mg  600 mg Oral QHS Brannon Levene T, MD   600 mg at 07/22/22 2117   LORazepam (ATIVAN) tablet 1 mg  1 mg Oral BID Jammal Sarr T, MD   1 mg at 07/23/22 3875   Or   LORazepam (ATIVAN) injection 1 mg  1 mg Intramuscular BID Jorel Gravlin T, MD       magnesium hydroxide (MILK OF  MAGNESIA) suspension 30 mL  30 mL Oral Daily PRN Cambridge Deleo T, MD       mirtazapine (REMERON) tablet 30 mg  30 mg Oral QHS Kiaya Haliburton T, MD   30 mg at 07/22/22 2118   multivitamin with minerals tablet 1 tablet  1 tablet Oral Daily Debar Plate, [name], MD   1 tablet at 07/23/22 0805   QUEtiapine (SEROQUEL) tablet 400 mg  400 mg Oral QHS Victoriano Campion T, MD   400 mg at 07/22/22 2117   thiamine (VITAMIN B1) tablet 100 mg  100 mg Oral Daily Sammy Cassar, [name], MD   100 mg at 07/23/22 0805    Lab Results: No results found for this or any previous visit (from the past 48 hour(s)).  Blood Alcohol level:  Lab Results  Component Value Date   ETH <10 03/29/2022   ETH <10 09/12/2021    Metabolic Disorder Labs: Lab Results  Component Value Date   HGBA1C 5.6 10/21/2021   MPG 114 10/21/2021   MPG 111 10/02/2021   Lab Results  Component Value Date   PROLACTIN 13.6 04/16/2015   Lab Results  Component Value Date   CHOL 189 10/21/2021   TRIG 52 10/21/2021   HDL 50 10/21/2021   CHOLHDL 3.8 10/21/2021   VLDL 10 10/21/2021   LDLCALC 129 (H) 10/21/2021   LDLCALC 93 10/01/2020    Physical Findings: AIMS: Facial and Oral Movements Muscles of Facial Expression: None, normal Lips and Perioral Area: None, normal Jaw: None, normal Tongue: None, normal,Extremity Movements Upper (arms, wrists, hands, fingers): None, normal Lower (legs, knees, ankles, toes): None, normal, Trunk Movements Neck, shoulders, hips: None, normal, Overall Severity Severity of abnormal movements (highest score from questions above): None, normal Incapacitation due to abnormal movements: None, normal Patient's awareness of abnormal movements (rate only patient's report): No Awareness, Dental Status Current problems with teeth and/or dentures?: No Does patient usually wear dentures?: No  CIWA:    COWS:     Musculoskeletal: Strength & Muscle Tone: within normal limits Gait & Station: normal Patient leans:  [name]  Psychiatric Specialty Exam:  Presentation  General Appearance:  Disheveled  Eye Contact: Fair  Speech: Pressured  Speech Volume: Increased  Handedness: Right   Mood and Affect  Mood: Irritable; Labile; Angry  Affect: Blunt; Labile   Thought Process  Thought Processes: Disorganized; Irrevelant  Descriptions of Associations:Loose  Orientation:Partial  Thought Content:Illogical; Rumination; Scattered; Tangential; Delusions  History of Schizophrenia/Schizoaffective disorder:Yes  Duration of Psychotic Symptoms:Greater than six months  Hallucinations:No data recorded Ideas of Reference:Percusatory; Paranoia  Suicidal Thoughts:No data recorded Homicidal Thoughts:No data recorded  Sensorium  Memory:  Immediate Fair; Recent Fair; Remote Fair  Judgment: Impaired  Insight: Poor; Lacking   Executive Functions  Concentration: Poor  Attention Span: Poor  Recall: Poor  Fund of Knowledge: Poor  Language: Poor   Psychomotor Activity  Psychomotor Activity:No data recorded  Assets  Assets: Physical Health; Resilience; Housing; Social Support   Sleep  Sleep:No data recorded   Physical Exam: Physical Exam Vitals and nursing note reviewed.  Constitutional:      Appearance: Normal appearance.  HENT:     Head: Normocephalic and atraumatic.     Mouth/Throat:     Pharynx: Oropharynx is clear.  Eyes:     Pupils: Pupils are equal, round, and reactive to light.  Cardiovascular:     Rate and Rhythm: Normal rate and regular rhythm.  Pulmonary:     Effort: Pulmonary effort is normal.     Breath sounds: Normal breath sounds.  Abdominal:     General: Abdomen is flat.     Palpations: Abdomen is soft.  Musculoskeletal:        General: Normal range of motion.  Skin:    General: Skin is warm and dry.  Neurological:     General: No focal deficit present.     Mental Status: She is alert. Mental status is at baseline.  Psychiatric:         Attention and Perception: She is inattentive.        Mood and Affect: Mood normal. Affect is blunt.        Speech: Speech is tangential.        Behavior: Behavior is agitated. Behavior is not aggressive.        Thought Content: Thought content is paranoid.    Review of Systems  Constitutional: Negative.   HENT: Negative.    Eyes: Negative.   Respiratory: Negative.    Cardiovascular: Negative.   Gastrointestinal: Negative.   Musculoskeletal: Negative.   Skin: Negative.   Neurological: Negative.   Psychiatric/Behavioral: Negative.     Blood pressure 115/63, pulse (!) 54, temperature 98.2 F (36.8 C), temperature source Oral, resp. rate 18, height 5\' 4"  (1.626 m), weight 40.4 kg, SpO2 100 %. Body mass index is 15.28 kg/m.   Treatment Plan Summary: Medication management and Plan no change to current medication management.  Continue to try to encourage group participation.  Treatment team working on possible options for ultimate discharge planning  Alethia Berthold, MD 07/23/2022, 10:34 AM

## 2022-07-23 NOTE — Plan of Care (Deleted)
Problem: Education: Goal: Knowledge of General Education information will improve Description: Including pain rating scale, medication(s)/side effects and non-pharmacologic comfort measures 07/23/2022 0940 by Earvin Hansen, RN Outcome: Progressing 07/23/2022 0928 by Earvin Hansen, RN Outcome: Progressing   Problem: Health Behavior/Discharge Planning: Goal: Ability to manage health-related needs will improve 07/23/2022 0940 by Earvin Hansen, RN Outcome: Progressing 07/23/2022 0928 by Earvin Hansen, RN Outcome: Progressing   Problem: Clinical Measurements: Goal: Ability to maintain clinical measurements within normal limits will improve 07/23/2022 0940 by Earvin Hansen, RN Outcome: Progressing 07/23/2022 0928 by Earvin Hansen, RN Outcome: Progressing Goal: Will remain free from infection 07/23/2022 0940 by Earvin Hansen, RN Outcome: Progressing 07/23/2022 0928 by Earvin Hansen, RN Outcome: Progressing Goal: Diagnostic test results will improve 07/23/2022 0940 by Earvin Hansen, RN Outcome: Progressing 07/23/2022 0928 by Earvin Hansen, RN Outcome: Progressing Goal: Respiratory complications will improve 07/23/2022 0940 by Earvin Hansen, RN Outcome: Progressing 07/23/2022 0928 by Earvin Hansen, RN Outcome: Progressing Goal: Cardiovascular complication will be avoided 07/23/2022 0940 by Earvin Hansen, RN Outcome: Progressing 07/23/2022 0928 by Earvin Hansen, RN Outcome: Progressing   Problem: Activity: Goal: Risk for activity intolerance will decrease 07/23/2022 0940 by Earvin Hansen, RN Outcome: Progressing 07/23/2022 0928 by Earvin Hansen, RN Outcome: Progressing   Problem: Nutrition: Goal: Adequate nutrition will be maintained 07/23/2022 0940 by Earvin Hansen, RN Outcome: Progressing 07/23/2022 0928 by Earvin Hansen, RN Outcome: Progressing   Problem: Coping: Goal: Level of anxiety will decrease 07/23/2022 0940 by Earvin Hansen, RN Outcome:  Progressing 07/23/2022 0928 by Earvin Hansen, RN Outcome: Progressing   Problem: Elimination: Goal: Will not experience complications related to bowel motility 07/23/2022 0940 by Earvin Hansen, RN Outcome: Progressing 07/23/2022 0928 by Earvin Hansen, RN Outcome: Progressing Goal: Will not experience complications related to urinary retention 07/23/2022 0940 by Earvin Hansen, RN Outcome: Progressing 07/23/2022 0928 by Earvin Hansen, RN Outcome: Progressing   Problem: Pain Managment: Goal: General experience of comfort will improve 07/23/2022 0940 by Earvin Hansen, RN Outcome: Progressing 07/23/2022 0928 by Earvin Hansen, RN Outcome: Progressing   Problem: Safety: Goal: Ability to remain free from injury will improve 07/23/2022 0940 by Earvin Hansen, RN Outcome: Progressing 07/23/2022 0928 by Earvin Hansen, RN Outcome: Progressing   Problem: Skin Integrity: Goal: Risk for impaired skin integrity will decrease 07/23/2022 0940 by Earvin Hansen, RN Outcome: Progressing 07/23/2022 0928 by Earvin Hansen, RN Outcome: Progressing   Problem: Activity: Goal: Will verbalize the importance of balancing activity with adequate rest periods 07/23/2022 0940 by Earvin Hansen, RN Outcome: Progressing 07/23/2022 0928 by Earvin Hansen, RN Outcome: Progressing   Problem: Education: Goal: Will be free of psychotic symptoms 07/23/2022 0940 by Earvin Hansen, RN Outcome: Progressing 07/23/2022 0928 by Earvin Hansen, RN Outcome: Progressing Goal: Knowledge of the prescribed therapeutic regimen will improve 07/23/2022 0940 by Earvin Hansen, RN Outcome: Progressing 07/23/2022 0928 by Earvin Hansen, RN Outcome: Progressing   Problem: Coping: Goal: Coping ability will improve 07/23/2022 0940 by Earvin Hansen, RN Outcome: Progressing 07/23/2022 0928 by Earvin Hansen, RN Outcome: Progressing Goal: Will verbalize feelings 07/23/2022 0940 by Earvin Hansen, RN Outcome:  Progressing 07/23/2022 0928 by Earvin Hansen, RN Outcome: Progressing   Problem: Health Behavior/Discharge Planning: Goal: Compliance with prescribed medication regimen will improve 07/23/2022 0940 by Earvin Hansen, RN Outcome: Progressing 07/23/2022  0928 by Earvin Hansen, RN Outcome: Progressing   Problem: Nutritional: Goal: Ability to achieve adequate nutritional intake will improve 07/23/2022 0940 by Earvin Hansen, RN Outcome: Progressing 07/23/2022 0928 by Earvin Hansen, RN Outcome: Progressing   Problem: Role Relationship: Goal: Ability to communicate needs accurately will improve 07/23/2022 0940 by Earvin Hansen, RN Outcome: Progressing 07/23/2022 0928 by Earvin Hansen, RN Outcome: Progressing Goal: Ability to interact with others will improve 07/23/2022 0940 by Earvin Hansen, RN Outcome: Progressing 07/23/2022 0928 by Earvin Hansen, RN Outcome: Progressing   Problem: Safety: Goal: Ability to redirect hostility and anger into socially appropriate behaviors will improve 07/23/2022 0940 by Earvin Hansen, RN Outcome: Progressing 07/23/2022 0928 by Earvin Hansen, RN Outcome: Progressing Goal: Ability to remain free from injury will improve 07/23/2022 0940 by Earvin Hansen, RN Outcome: Progressing 07/23/2022 0928 by Earvin Hansen, RN Outcome: Progressing   Problem: Self-Care: Goal: Ability to participate in self-care as condition permits will improve 07/23/2022 0940 by Earvin Hansen, RN Outcome: Progressing 07/23/2022 0928 by Earvin Hansen, RN Outcome: Progressing   Problem: Self-Concept: Goal: Will verbalize positive feelings about self 07/23/2022 0940 by Earvin Hansen, RN Outcome: Progressing 07/23/2022 0928 by Earvin Hansen, RN Outcome: Progressing   Problem: Education: Goal: Knowledge of [place] Education information/materials will improve 07/23/2022 0940 by Earvin Hansen, RN Outcome: Progressing 07/23/2022 0928 by Earvin Hansen,  RN Outcome: Progressing Goal: Emotional status will improve 07/23/2022 0940 by Earvin Hansen, RN Outcome: Progressing 07/23/2022 0928 by Earvin Hansen, RN Outcome: Progressing Goal: Mental status will improve 07/23/2022 0940 by Earvin Hansen, RN Outcome: Progressing 07/23/2022 0928 by Earvin Hansen, RN Outcome: Progressing Goal: Verbalization of understanding the information provided will improve 07/23/2022 0940 by Earvin Hansen, RN Outcome: Progressing 07/23/2022 0928 by Earvin Hansen, RN Outcome: Progressing   Problem: Activity: Goal: Interest or engagement in activities will improve 07/23/2022 0940 by Earvin Hansen, RN Outcome: Progressing 07/23/2022 0928 by Earvin Hansen, RN Outcome: Progressing Goal: Sleeping patterns will improve 07/23/2022 0940 by Earvin Hansen, RN Outcome: Progressing 07/23/2022 0928 by Earvin Hansen, RN Outcome: Progressing   Problem: Coping: Goal: Ability to verbalize frustrations and anger appropriately will improve 07/23/2022 0940 by Earvin Hansen, RN Outcome: Progressing 07/23/2022 0928 by Earvin Hansen, RN Outcome: Progressing Goal: Ability to demonstrate self-control will improve 07/23/2022 0940 by Earvin Hansen, RN Outcome: Progressing 07/23/2022 0928 by Earvin Hansen, RN Outcome: Progressing   Problem: Health Behavior/Discharge Planning: Goal: Identification of resources available to assist in meeting health care needs will improve 07/23/2022 0940 by Earvin Hansen, RN Outcome: Progressing 07/23/2022 0928 by Earvin Hansen, RN Outcome: Progressing Goal: Compliance with treatment plan for underlying cause of condition will improve 07/23/2022 0940 by Earvin Hansen, RN Outcome: Progressing 07/23/2022 0928 by Earvin Hansen, RN Outcome: Progressing   Problem: Physical Regulation: Goal: Ability to maintain clinical measurements within normal limits will improve 07/23/2022 0940 by Earvin Hansen, RN Outcome: Progressing 07/23/2022 0928  by Earvin Hansen, RN Outcome: Progressing   Problem: Safety: Goal: Periods of time without injury will increase 07/23/2022 0940 by Earvin Hansen, RN Outcome: Progressing 07/23/2022 0928 by Earvin Hansen, RN Outcome: Progressing

## 2022-07-23 NOTE — [facility] Counselor (Signed)
CSW team and psychiatrist had a phone conference with the patient's sister/legal guardian.  Legal guardian continues to report that patient can not return home due to concerns that the patient still wants to put their mother out of the home.  She reports that she is open to a Memory Care facility.  Psychiatrist discussed with the guardian that patient would be inappropriate for a memory care unit, in part to no diagnosis of dementia.  Guardian expressed understanding  As a back up guardian agreed to plan for patient to go to a group home.  Sister reports that she would like for the group home to okay having equipment placed in the home to monitor the patient.  Treatment team explained that group homes typically provide 24 supervision and monitor residents. Sister expressed understanding.    CSW asked about pt's funding and sister reports that patient receives Medicare and SSI ($1800).    CSW reports that team will assist in finding placement for patient.  Sister reports that she will follow up with Medicaid about possible funding as well as her attorney.  Assunta Curtis, MSW, LCSW 07/23/2022 11:03 AM

## 2022-07-23 NOTE — Progress Notes (Signed)
Patient continues to be isolative to her room and withdrawn from others. She is med compliant and does come out for meals and snacks.  She denies si hi  avh depression and anxiety.  Although she denies she presents flat and depressed.  Encouragement and support offered/  Will continue to monitor.  Q15 minute safety checks in place.     C Butler-Nicholson, LPN

## 2022-07-23 NOTE — [facility] Group Notes (Signed)
[facility] Group Notes:  (Nursing/MHT/Case Management/Adjunct)  Date:  07/23/2022  Time:  8:24 PM  Type of Therapy:   Wrap up  Participation Level:  Did Not Attend   [name] 07/23/2022, 8:24 PM

## 2022-07-23 NOTE — Plan of Care (Signed)
  Problem: Education: Goal: Knowledge of General Education information will improve Description: Including pain rating scale, medication(s)/side effects and non-pharmacologic comfort measures Outcome: Progressing   Problem: Health Behavior/Discharge Planning: Goal: Ability to manage health-related needs will improve Outcome: Progressing   Problem: Clinical Measurements: Goal: Ability to maintain clinical measurements within normal limits will improve Outcome: Progressing Goal: Will remain free from infection Outcome: Progressing Goal: Diagnostic test results will improve Outcome: Progressing Goal: Respiratory complications will improve Outcome: Progressing Goal: Cardiovascular complication will be avoided Outcome: Progressing   Problem: Activity: Goal: Risk for activity intolerance will decrease Outcome: Progressing   Problem: Nutrition: Goal: Adequate nutrition will be maintained Outcome: Progressing   Problem: Coping: Goal: Level of anxiety will decrease Outcome: Progressing   Problem: Elimination: Goal: Will not experience complications related to bowel motility Outcome: Progressing Goal: Will not experience complications related to urinary retention Outcome: Progressing   Problem: Pain Managment: Goal: General experience of comfort will improve Outcome: Progressing   Problem: Safety: Goal: Ability to remain free from injury will improve Outcome: Progressing   Problem: Skin Integrity: Goal: Risk for impaired skin integrity will decrease Outcome: Progressing   Problem: Activity: Goal: Will verbalize the importance of balancing activity with adequate rest periods Outcome: Progressing   Problem: Education: Goal: Will be free of psychotic symptoms Outcome: Progressing Goal: Knowledge of the prescribed therapeutic regimen will improve Outcome: Progressing   Problem: Coping: Goal: Coping ability will improve Outcome: Progressing Goal: Will verbalize  feelings Outcome: Progressing   Problem: Health Behavior/Discharge Planning: Goal: Compliance with prescribed medication regimen will improve Outcome: Progressing   Problem: Nutritional: Goal: Ability to achieve adequate nutritional intake will improve Outcome: Progressing   Problem: Role Relationship: Goal: Ability to communicate needs accurately will improve Outcome: Progressing Goal: Ability to interact with others will improve Outcome: Progressing   Problem: Safety: Goal: Ability to redirect hostility and anger into socially appropriate behaviors will improve Outcome: Progressing Goal: Ability to remain free from injury will improve Outcome: Progressing   Problem: Self-Care: Goal: Ability to participate in self-care as condition permits will improve Outcome: Progressing   Problem: Self-Concept: Goal: Will verbalize positive feelings about self Outcome: Progressing   Problem: Education: Goal: Knowledge of [place] [facility] Education information/materials will improve Outcome: Progressing Goal: Emotional status will improve Outcome: Progressing Goal: Mental status will improve Outcome: Progressing Goal: Verbalization of understanding the information provided will improve Outcome: Progressing   Problem: Activity: Goal: Interest or engagement in activities will improve Outcome: Progressing Goal: Sleeping patterns will improve Outcome: Progressing   Problem: Coping: Goal: Ability to verbalize frustrations and anger appropriately will improve Outcome: Progressing Goal: Ability to demonstrate self-control will improve Outcome: Progressing   Problem: Health Behavior/Discharge Planning: Goal: Identification of resources available to assist in meeting health care needs will improve Outcome: Progressing Goal: Compliance with treatment plan for underlying cause of condition will improve Outcome: Progressing   Problem: Physical Regulation: Goal: Ability to  maintain clinical measurements within normal limits will improve Outcome: Progressing   Problem: Safety: Goal: Periods of time without injury will increase Outcome: Progressing   

## 2022-07-23 NOTE — Group Note (Signed)
LCSW Group Therapy Note  Group Date: 07/23/2022 Start Time: 1300 End Time: 1400   Type of Therapy and Topic:  Group Therapy: Positive Affirmations  Participation Level:  Did Not Attend   Description of Group:   This group addressed positive affirmation towards self and others.  Patients went around the room and identified two positive things about themselves and two positive things about a peer in the room.  Patients reflected on how it felt to share something positive with others, to identify positive things about themselves, and to hear positive things from others/ Patients were encouraged to have a daily reflection of positive characteristics or circumstances.   Therapeutic Goals: Patients will verbalize two of their positive qualities Patients will demonstrate empathy for others by stating two positive qualities about a peer in the group Patients will verbalize their feelings when voicing positive self affirmations and when voicing positive affirmations of others Patients will discuss the potential positive impact on their wellness/recovery of focusing on positive traits of self and others.  Summary of Patient Progress:   Patient declined to attend group despite encouragement from this clinician.   Therapeutic Modalities:   Cognitive Behavioral Therapy Motivational Interviewing    Rozann Lesches, [place] 07/23/2022  1:44 PM

## 2022-07-24 DIAGNOSIS — F251 Schizoaffective disorder, depressive type: Secondary | ICD-10-CM | POA: Diagnosis not present

## 2022-07-24 NOTE — Group Note (Signed)
[facility] LCSW Group Therapy Note   Group Date: 07/24/2022 Start Time: 1300 End Time: 1400  Type of Therapy and Topic:  Group Therapy:  Feelings around Relapse and Recovery  Participation Level:  Did Not Attend    Description of Group:    Patients in this group will discuss emotions they experience before and after a relapse. They will process how experiencing these feelings, or avoidance of experiencing them, relates to having a relapse. Facilitator will guide patients to explore emotions they have related to recovery. Patients will be encouraged to process which emotions are more powerful. They will be guided to discuss the emotional reaction significant others in their lives may have to patients' relapse or recovery. Patients will be assisted in exploring ways to respond to the emotions of others without this contributing to a relapse.  Therapeutic Goals: Patient will identify two or more emotions that lead to relapse for them:  Patient will identify two emotions that result when they relapse:  Patient will identify two emotions related to recovery:  Patient will demonstrate ability to communicate their needs through discussion and/or role plays.   Summary of Patient Progress: X   Therapeutic Modalities:   Cognitive Behavioral Therapy Solution-Focused Therapy Assertiveness Training Relapse Prevention Therapy   [name] Paula Savyon [place], LCSW 

## 2022-07-24 NOTE — BH IP Treatment Plan (Signed)
Interdisciplinary Treatment and Diagnostic Plan Update  07/24/2022 Time of Session: 8:50AM [name] MRN: [id number]  Principal Diagnosis: Schizoaffective disorder, depressive type ([place])  Secondary Diagnoses: Principal Problem:   Schizoaffective disorder, depressive type ([place])   Current Medications:  Current Facility-Administered Medications  Medication Dose Route Frequency Provider Last Rate Last Admin   acetaminophen (TYLENOL) tablet 650 mg  650 mg Oral Q6H PRN [name], [name], MD       alum & mag hydroxide-simeth (MAALOX/MYLANTA) 200-200-20 MG/5ML suspension 30 mL  30 mL Oral Q4H PRN [name], [name], MD       haloperidol (HALDOL) tablet 5 mg  5 mg Oral BID [name], [name], MD   5 mg at 07/24/22 6578   Or   haloperidol lactate (HALDOL) injection 5 mg  5 mg Intramuscular BID [name], [name], MD       lithium carbonate capsule 600 mg  600 mg Oral QHS [name], [name], MD   600 mg at 07/23/22 2114   LORazepam (ATIVAN) tablet 1 mg  1 mg Oral BID [name], [name], MD   1 mg at 07/24/22 4696   Or   LORazepam (ATIVAN) injection 1 mg  1 mg Intramuscular BID [name], [name], MD       magnesium hydroxide (MILK OF MAGNESIA) suspension 30 mL  30 mL Oral Daily PRN [name], [name], MD       mirtazapine (REMERON) tablet 30 mg  30 mg Oral QHS [name], [name], MD   30 mg at 07/23/22 2114   multivitamin with minerals tablet 1 tablet  1 tablet Oral Daily [name], [name], MD   1 tablet at 07/24/22 2952   QUEtiapine (SEROQUEL) tablet 400 mg  400 mg Oral QHS [name], [name], MD   400 mg at 07/23/22 2114   thiamine (VITAMIN B1) tablet 100 mg  100 mg Oral Daily [name], [name], MD   100 mg at 07/24/22 8413   PTA Medications: Medications Prior to Admission  Medication Sig Dispense Refill Last Dose   clonazePAM (KLONOPIN) 0.5 MG tablet Take 0.5 tablets (0.25 mg total) by mouth 2 (two) times daily. (Patient not taking: Reported on 03/30/2022) 60 tablet 0    haloperidol (HALDOL) 5 MG tablet Take 1 tablet (5 mg  total) by mouth 2 (two) times daily. (Patient not taking: Reported on 03/30/2022) 60 tablet 3    midodrine (PROAMATINE) 2.5 MG tablet Take 1 tablet (2.5 mg total) by mouth 3 (three) times daily with meals. (Patient not taking: Reported on 03/30/2022)  0    mirtazapine (REMERON) 15 MG tablet Take 0.5 tablets (7.5 mg total) by mouth at bedtime. (Patient not taking: Reported on 03/30/2022) 30 tablet 2    Multiple Vitamin (MULTIVITAMIN WITH MINERALS) TABS tablet Take 1 tablet by mouth daily. (Patient not taking: Reported on 03/30/2022)      OLANZapine (ZYPREXA) 5 MG tablet Take 1 tablet (5 mg total) by mouth at bedtime. (Patient not taking: Reported on 03/30/2022) 30 tablet 2    thiamine 100 MG tablet Take 1 tablet (100 mg total) by mouth daily. (Patient not taking: Reported on 03/30/2022)       Patient Stressors: Marital or family conflict    Patient Strengths: Supportive family/friends   Treatment Modalities: Medication Management, Group therapy, Case management,  1 to 1 session with clinician, Psychoeducation, Recreational therapy.   Physician Treatment Plan for Primary Diagnosis: Schizoaffective disorder, depressive type ([place]) Long Term Goal(s): Improvement in symptoms so as ready for  discharge   Short Term Goals: Compliance with prescribed medications will improve Ability to verbalize feelings will improve Ability to demonstrate self-control will improve Ability to identify and develop effective coping behaviors will improve  Medication Management: Evaluate patient's response, side effects, and tolerance of medication regimen.  Therapeutic Interventions: 1 to 1 sessions, Unit Group sessions and Medication administration.  Evaluation of Outcomes: Progressing  Physician Treatment Plan for Secondary Diagnosis: Principal Problem:   Schizoaffective disorder, depressive type ([facility])  Long Term Goal(s): Improvement in symptoms so as ready for discharge   Short Term Goals: Compliance with  prescribed medications will improve Ability to verbalize feelings will improve Ability to demonstrate self-control will improve Ability to identify and develop effective coping behaviors will improve     Medication Management: Evaluate patient's response, side effects, and tolerance of medication regimen.  Therapeutic Interventions: 1 to 1 sessions, Unit Group sessions and Medication administration.  Evaluation of Outcomes: Progressing   RN Treatment Plan for Primary Diagnosis: Schizoaffective disorder, depressive type (HCC) Long Term Goal(s): Knowledge of disease and therapeutic regimen to maintain health will improve  Short Term Goals: Ability to verbalize frustration and anger appropriately will improve, Ability to demonstrate self-control, Ability to participate in decision making will improve, Ability to verbalize feelings will improve, Ability to disclose and discuss suicidal ideas, Ability to identify and develop effective coping behaviors will improve, and Compliance with prescribed medications will improve  Medication Management: RN will administer medications as ordered by provider, will assess and evaluate patient's response and provide education to patient for prescribed medication. RN will report any adverse and/or side effects to prescribing provider.  Therapeutic Interventions: 1 on 1 counseling sessions, Psychoeducation, Medication administration, Evaluate responses to treatment, Monitor vital signs and CBGs as ordered, Perform/monitor CIWA, COWS, AIMS and Fall Risk screenings as ordered, Perform wound care treatments as ordered.  Evaluation of Outcomes: Progressing   LCSW Treatment Plan for Primary Diagnosis: Schizoaffective disorder, depressive type (HCC) Long Term Goal(s): Safe transition to appropriate next level of care at discharge, Engage patient in therapeutic group addressing interpersonal concerns.  Short Term Goals: Engage patient in aftercare planning with  referrals and resources, Increase social support, Increase ability to appropriately verbalize feelings, Increase emotional regulation, Facilitate acceptance of mental health diagnosis and concerns, and Increase skills for wellness and recovery  Therapeutic Interventions: Assess for all discharge needs, 1 to 1 time with Social worker, Explore available resources and support systems, Assess for adequacy in community support network, Educate family and significant other(s) on suicide prevention, Complete Psychosocial Assessment, Interpersonal group therapy.  Evaluation of Outcomes: Progressing   Progress in Treatment: Attending groups: No. Participating in groups: No. Taking medication as prescribed: Yes. Toleration medication: Yes. Family/Significant other contact made: Yes, individual(s) contacted:  SPE completed with the patient 's guardian Patient understands diagnosis: No. Discussing patient identified problems/goals with staff: Yes. Medical problems stabilized or resolved: Yes. Denies suicidal/homicidal ideation: Yes. Issues/concerns per patient self-inventory: No. Other: none  New problem(s) identified: No, Describe:  none  Update 06/29/2022: No changes at this time. Update 07/04/2022: No changes at this time. Update 07/09/22: No changes at this time. Update 07/14/22: No changes at this time.  Update 07/19/2022:   No changes at this time. Update 07/24/2022:  No changes at this time.    New Short Term/Long Term Goal(s): Patient to work towards elimination of symptoms of psychosis, medication management for mood stabilization; elimination of SI thoughts; development of comprehensive mental wellness plan.  Update 06/29/2022: No changes at  this time. Update 07/04/2022: No changes at this time. Update 07/09/22: No changes at this time. Update 07/14/22: No changes at this time.   Update 07/19/2022:   No changes at this time.  Update 07/24/2022:  No changes at this time.    Patient Goals:  No  additional goals identified at this time. Patient to continue to work towards original goals identified in initial treatment team meeting. CSW will remain available to patient should they voice additional treatment goals. Update 06/29/2022: No changes at this time. Update 07/04/2022: No changes at this time. Update 07/09/22: No changes at this time. Update 07/14/22: No changes at this time.   Update 07/19/2022:   No changes at this time. Update 07/24/2022:  No changes at this time.    Discharge Plan or Barriers: Patient lacks adequate housing/supervision at discharge. Patient's guardian refuses to pick patient up from hospital out of safety concerns. CSW team continuing to work with legal guardian to attempt to resolve concerns and identify adequate discharge plan. Update 06/29/2022: Guardian is seeking special assistance medicaid.  She has not completed the paperwork at last update.  Patient remains on the unit and safe, however, no plans have been developed for placement at discharge.  Patient lacks funding for group home, and can not afford out of pocket costs associated with Assisted Living. Update 07/04/2022: Currently seeking ALF/Group Home placement. [facility] reviewing for potential placement. Update 07/09/22: No changes at this time. Update 07/14/22: No changes at this time.   Update 07/19/2022:   No changes at this time. Update 07/24/2022:  Legal guardian continues to decline that patient can return home.  Guardian is now open to the idea of the patient going to a group home, if the home allows for the equipment to monitor the patient.   Reason for Continuation of Hospitalization: Other; describe Patient is psychiatrically clear. Patient is currently boarding.    Estimated Length of Stay: TBD Update 06/29/2022: TBD Update 07/04/2022: No changes at this time. Update 07/09/22: No changes at this time. Update 07/14/22: No changes at this time.   Update 07/19/2022:   No changes at this time. Update 07/24/2022:   TBD  Last 3 [place] Suicide Severity Risk Score: Flowsheet Row Admission (Current) from 04/08/2022 in [facility] Admission (Discharged) from 10/17/2021 in [facility] Admission (Discharged) from 10/02/2021 in [facility]  C-SSRS RISK CATEGORY No Risk No Risk No Risk       Last PHQ 2/9 Scores:     No data to display          Scribe for Treatment Team: Rozann Lesches, Marlinda Mike 07/24/2022 10:17 AM

## 2022-07-24 NOTE — Plan of Care (Signed)
D- Patient alert and oriented. Patient presented in a pleasant mood on assessment reporting that she slept good last night and had no complaints to voice to this Probation officer. Patient denied SI, HI, AVH, and pain at this time. Patient also denied any signs/symptoms of depression and anxiety. Patient's goal for today is once again, is discharge.   A- Scheduled medications administered to patient, per MD orders. Support and encouragement provided.  Routine safety checks conducted every 15 minutes.  Patient informed to notify staff with problems or concerns.  R- No adverse drug reactions noted. Patient contracts for safety at this time. Patient compliant with medications and treatment plan. Patient receptive, calm, and cooperative. Patient interacts well with others on the unit.  Patient remains safe at this time.  Problem: Education: Goal: Knowledge of General Education information will improve Description: Including pain rating scale, medication(s)/side effects and non-pharmacologic comfort measures Outcome: Progressing   Problem: Health Behavior/Discharge Planning: Goal: Ability to manage health-related needs will improve Outcome: Progressing   Problem: Clinical Measurements: Goal: Ability to maintain clinical measurements within normal limits will improve Outcome: Progressing Goal: Will remain free from infection Outcome: Progressing Goal: Diagnostic test results will improve Outcome: Progressing Goal: Respiratory complications will improve Outcome: Progressing Goal: Cardiovascular complication will be avoided Outcome: Progressing   Problem: Activity: Goal: Risk for activity intolerance will decrease Outcome: Progressing   Problem: Nutrition: Goal: Adequate nutrition will be maintained Outcome: Progressing   Problem: Coping: Goal: Level of anxiety will decrease Outcome: Progressing   Problem: Elimination: Goal: Will not experience complications related to bowel motility Outcome:  Progressing Goal: Will not experience complications related to urinary retention Outcome: Progressing   Problem: Pain Managment: Goal: General experience of comfort will improve Outcome: Progressing   Problem: Safety: Goal: Ability to remain free from injury will improve Outcome: Progressing   Problem: Skin Integrity: Goal: Risk for impaired skin integrity will decrease Outcome: Progressing   Problem: Activity: Goal: Will verbalize the importance of balancing activity with adequate rest periods Outcome: Progressing   Problem: Education: Goal: Will be free of psychotic symptoms Outcome: Progressing Goal: Knowledge of the prescribed therapeutic regimen will improve Outcome: Progressing   Problem: Coping: Goal: Coping ability will improve Outcome: Progressing Goal: Will verbalize feelings Outcome: Progressing   Problem: Health Behavior/Discharge Planning: Goal: Compliance with prescribed medication regimen will improve Outcome: Progressing   Problem: Nutritional: Goal: Ability to achieve adequate nutritional intake will improve Outcome: Progressing   Problem: Role Relationship: Goal: Ability to communicate needs accurately will improve Outcome: Progressing Goal: Ability to interact with others will improve Outcome: Progressing   Problem: Safety: Goal: Ability to redirect hostility and anger into socially appropriate behaviors will improve Outcome: Progressing Goal: Ability to remain free from injury will improve Outcome: Progressing   Problem: Self-Care: Goal: Ability to participate in self-care as condition permits will improve Outcome: Progressing   Problem: Self-Concept: Goal: Will verbalize positive feelings about self Outcome: Progressing   Problem: Education: Goal: Knowledge of [facility] Education information/materials will improve Outcome: Progressing Goal: Emotional status will improve Outcome: Progressing Goal: Mental status will  improve Outcome: Progressing Goal: Verbalization of understanding the information provided will improve Outcome: Progressing   Problem: Activity: Goal: Interest or engagement in activities will improve Outcome: Progressing Goal: Sleeping patterns will improve Outcome: Progressing   Problem: Coping: Goal: Ability to verbalize frustrations and anger appropriately will improve Outcome: Progressing Goal: Ability to demonstrate self-control will improve Outcome: Progressing   Problem: Health Behavior/Discharge Planning: Goal: Identification of  resources available to assist in meeting health care needs will improve Outcome: Progressing Goal: Compliance with treatment plan for underlying cause of condition will improve Outcome: Progressing   Problem: Physical Regulation: Goal: Ability to maintain clinical measurements within normal limits will improve Outcome: Progressing   Problem: Safety: Goal: Periods of time without injury will increase Outcome: Progressing

## 2022-07-24 NOTE — Progress Notes (Signed)
Recreation Therapy Notes  Date: 07/24/2022  Time: 10:50 am  Location: Craft room     Behavioral response: [name]   Intervention Topic: Life Planning    Discussion/Intervention: Patient refused to attend group.   Clinical Observations/Feedback:  Patient refused to attend group.    Neidra Girvan LRT/CTRS        Virdell Hoiland 07/24/2022 12:55 PM

## 2022-07-24 NOTE — Progress Notes (Signed)
[facility] MD Progress Note  07/24/2022 2:06 PM [name]  MRN:  [id number] Subjective: Follow-up 62 year old woman with schizoaffective disorder.  No change to presentation.  Continues to be very withdrawn although she takes care of her eating okay.  Tried to engage her in some conversation about the meeting we had with her sister this week and once again she just became agitated and started shaking her head and refusing to talk about it. Principal Problem: Schizoaffective disorder, depressive type (H[facility]) Diagnosis: Principal Problem:   Schizoaffective disorder, depressive type ([facility])  Total Time spent with patient: 30 minutes  Past Psychiatric History: Past history of schizoaffective disorder  Past Medical History:  Past Medical History:  Diagnosis Date   Anemia 10/02/2021   Non compliance w medication regimen    Schizophrenia [facility])     Past Surgical History:  Procedure Laterality Date   BIOPSY  09/25/2021   Procedure: BIOPSY;  Surgeon: Rachael Fee, MD;  Location: [facility];  Service: Gastroenterology;;   ESOPHAGOGASTRODUODENOSCOPY (EGD) WITH PROPOFOL [name] 09/25/2021   Procedure: ESOPHAGOGASTRODUODENOSCOPY (EGD) WITH PROPOFOL;  Surgeon: Rachael Fee, MD;  Location: [facility];  Service: Gastroenterology;  Laterality: [name];  With NGT placement   Family History: History reviewed. No pertinent family history. Family Psychiatric  History: See previous Social History:  Social History   Substance and Sexual Activity  Alcohol Use Not Currently   Comment: Refuses to disclose how much     Social History   Substance and Sexual Activity  Drug Use No   Comment: Refuses to answer    Social History   Socioeconomic History   Marital status: Single    Spouse name: Not on file   Number of children: Not on file   Years of education: Not on file   Highest education level: Not on file  Occupational History   Not on file  Tobacco Use   Smoking status: Every Day    Packs/day: 2.00     Types: Cigarettes   Smokeless tobacco: Never  Substance and Sexual Activity   Alcohol use: Not Currently    Comment: Refuses to disclose how much   Drug use: No    Comment: Refuses to answer   Sexual activity: Not Currently    Comment: refused to answer  Other Topics Concern   Not on file  Social History Narrative   Not on file   Social Determinants of Health   Financial Resource Strain: Not on file  Food Insecurity: No Food Insecurity (04/08/2022)   Hunger Vital Sign    Worried About Running Out of Food in the Last Year: Never true    Ran Out of Food in the Last Year: Never true  Transportation Needs: No Transportation Needs (04/08/2022)   PRAPARE - Administrator, Civil Service (Medical): No    Lack of Transportation (Non-Medical): No  Physical Activity: Not on file  Stress: Not on file  Social Connections: Not on file   Additional Social History:                         Sleep: Fair  Appetite:  Fair  Current Medications: Current Facility-Administered Medications  Medication Dose Route Frequency Provider Last Rate Last Admin   acetaminophen (TYLENOL) tablet 650 mg  650 mg Oral Q6H PRN Devona Holmes T, MD       alum & mag hydroxide-simeth (MAALOX/MYLANTA) 200-200-20 MG/5ML suspension 30 mL  30 mL Oral Q4H PRN Koree Schopf  T, MD       haloperidol (HALDOL) tablet 5 mg  5 mg Oral BID Tattianna Schnarr T, MD   5 mg at 07/24/22 5397   Or   haloperidol lactate (HALDOL) injection 5 mg  5 mg Intramuscular BID Porsche Noguchi T, MD       lithium carbonate capsule 600 mg  600 mg Oral QHS Gita Dilger T, MD   600 mg at 07/23/22 2114   LORazepam (ATIVAN) tablet 1 mg  1 mg Oral BID Fenna Semel T, MD   1 mg at 07/24/22 6734   Or   LORazepam (ATIVAN) injection 1 mg  1 mg Intramuscular BID Aneeka Bowden T, MD       magnesium hydroxide (MILK OF MAGNESIA) suspension 30 mL  30 mL Oral Daily PRN Reshard Guillet T, MD       mirtazapine (REMERON) tablet 30 mg  30 mg Oral QHS  Tryston Gilliam T, MD   30 mg at 07/23/22 2114   multivitamin with minerals tablet 1 tablet  1 tablet Oral Daily Darreon Lutes, [name], MD   1 tablet at 07/24/22 1937   QUEtiapine (SEROQUEL) tablet 400 mg  400 mg Oral QHS Raheim Beutler T, MD   400 mg at 07/23/22 2114   thiamine (VITAMIN B1) tablet 100 mg  100 mg Oral Daily Glendoris Nodarse, [name], MD   100 mg at 07/24/22 9024    Lab Results: No results found for this or any previous visit (from the past 37 hour(s)).  Blood Alcohol level:  Lab Results  Component Value Date   ETH <10 03/29/2022   ETH <10 09/73/5329    Metabolic Disorder Labs: Lab Results  Component Value Date   HGBA1C 5.6 10/21/2021   MPG 114 10/21/2021   MPG 111 10/02/2021   Lab Results  Component Value Date   PROLACTIN 13.6 04/16/2015   Lab Results  Component Value Date   CHOL 189 10/21/2021   TRIG 52 10/21/2021   HDL 50 10/21/2021   CHOLHDL 3.8 10/21/2021   VLDL 10 10/21/2021   LDLCALC 129 (H) 10/21/2021   LDLCALC 93 10/01/2020    Physical Findings: AIMS: Facial and Oral Movements Muscles of Facial Expression: None, normal Lips and Perioral Area: None, normal Jaw: None, normal Tongue: None, normal,Extremity Movements Upper (arms, wrists, hands, fingers): None, normal Lower (legs, knees, ankles, toes): None, normal, Trunk Movements Neck, shoulders, hips: None, normal, Overall Severity Severity of abnormal movements (highest score from questions above): None, normal Incapacitation due to abnormal movements: None, normal Patient's awareness of abnormal movements (rate only patient's report): No Awareness, Dental Status Current problems with teeth and/or dentures?: No Does patient usually wear dentures?: No  CIWA:    COWS:     Musculoskeletal: Strength & Muscle Tone: within normal limits Gait & Station: normal Patient leans: [name]  Psychiatric Specialty Exam:  Presentation  General Appearance:  Disheveled  Eye Contact: Fair  Speech: Pressured  Speech  Volume: Increased  Handedness: Right   Mood and Affect  Mood: Irritable; Labile; Angry  Affect: Blunt; Labile   Thought Process  Thought Processes: Disorganized; Irrevelant  Descriptions of Associations:Loose  Orientation:Partial  Thought Content:Illogical; Rumination; Scattered; Tangential; Delusions  History of Schizophrenia/Schizoaffective disorder:Yes  Duration of Psychotic Symptoms:Greater than six months  Hallucinations:No data recorded Ideas of Reference:Percusatory; Paranoia  Suicidal Thoughts:No data recorded Homicidal Thoughts:No data recorded  Sensorium  Memory: Immediate Fair; Recent Fair; Remote Fair  Judgment: Impaired  Insight: Poor; Lacking   Community education officer  Concentration: Poor  Attention Span: Poor  Recall: Poor  Fund of Knowledge: Poor  Language: Poor   Psychomotor Activity  Psychomotor Activity:No data recorded  Assets  Assets: Physical Health; Resilience; Housing; Social Support   Sleep  Sleep:No data recorded   Physical Exam: Physical Exam Vitals and nursing note reviewed.  Constitutional:      Appearance: Normal appearance.  HENT:     Head: Normocephalic and atraumatic.     Mouth/Throat:     Pharynx: Oropharynx is clear.  Eyes:     Pupils: Pupils are equal, round, and reactive to light.  Cardiovascular:     Rate and Rhythm: Normal rate and regular rhythm.  Pulmonary:     Effort: Pulmonary effort is normal.     Breath sounds: Normal breath sounds.  Abdominal:     General: Abdomen is flat.     Palpations: Abdomen is soft.  Musculoskeletal:        General: Normal range of motion.  Skin:    General: Skin is warm and dry.  Neurological:     General: No focal deficit present.     Mental Status: She is alert. Mental status is at baseline.  Psychiatric:        Attention and Perception: She is inattentive.        Mood and Affect: Mood normal. Affect is blunt.        Speech: Speech is delayed.         Behavior: Behavior is slowed.        Thought Content: Thought content is paranoid and delusional.    Review of Systems  Constitutional: Negative.   HENT: Negative.    Eyes: Negative.   Respiratory: Negative.    Cardiovascular: Negative.   Gastrointestinal: Negative.   Musculoskeletal: Negative.   Skin: Negative.   Neurological: Negative.   Psychiatric/Behavioral: Negative.     Blood pressure 118/63, pulse (!) 55, temperature 98.2 F (36.8 C), temperature source Oral, resp. rate 18, height 5\' 4"  (1.626 m), weight 40.4 kg, SpO2 100 %. Body mass index is 15.28 kg/m.   Treatment Plan Summary: Medication management and Plan continue current medication.  We had a meeting with her sister this week.  Little change except social work is considering the possibility of reporting to APS.  Alethia Berthold, MD 07/24/2022, 2:06 PM

## 2022-07-24 NOTE — Progress Notes (Signed)
Patient was cooperative with medication and treatment on shift. No new behavioral issues to report on shift at this time.

## 2022-07-24 NOTE — Plan of Care (Signed)
Pt continues to be cooperative, calm and pleasant.  Pt is compliant with medication and denies SI HI AVH or pain.  Continued monitoring via q74m observations.

## 2022-07-25 DIAGNOSIS — F251 Schizoaffective disorder, depressive type: Secondary | ICD-10-CM | POA: Diagnosis not present

## 2022-07-25 NOTE — Progress Notes (Signed)
[facility] MD Progress Note  07/25/2022 12:17 PM [name]  MRN:  [id number] Subjective: Follow-up patient with schizoaffective disorder.  No change at all presentation.  Comes out of her room and eats a bit.  Takes care of her basic hygiene and health.  Continues to have a blunt affect and to get agitated anytime discharge planning is discussed.  As mentioned previously the treatment team is considering options for legal action to try and force disposition from her guardian. Principal Problem: Schizoaffective disorder, depressive type ([place]) Diagnosis: Principal Problem:   Schizoaffective disorder, depressive type ([place])  Total Time spent with patient: 30 minutes  Past Psychiatric History: Past history of schizoaffective disorder  Past Medical History:  Past Medical History:  Diagnosis Date   Anemia 10/02/2021   Non compliance w medication regimen    Schizophrenia [name])     Past Surgical History:  Procedure Laterality Date   BIOPSY  09/25/2021   Procedure: BIOPSY;  Surgeon: Milus Banister, MD;  Location: [facility];  Service: Gastroenterology;;   ESOPHAGOGASTRODUODENOSCOPY (EGD) WITH PROPOFOL [name] 09/25/2021   Procedure: ESOPHAGOGASTRODUODENOSCOPY (EGD) WITH PROPOFOL;  Surgeon: Milus Banister, MD;  Location: [facility];  Service: Gastroenterology;  Laterality: [name];  With NGT placement   Family History: History reviewed. No pertinent family history. Family Psychiatric  History: See previous Social History:  Social History   Substance and Sexual Activity  Alcohol Use Not Currently   Comment: Refuses to disclose how much     Social History   Substance and Sexual Activity  Drug Use No   Comment: Refuses to answer    Social History   Socioeconomic History   Marital status: Single    Spouse name: Not on file   Number of children: Not on file   Years of education: Not on file   Highest education level: Not on file  Occupational History   Not on file  Tobacco Use   Smoking  status: Every Day    Packs/day: 2.00    Types: Cigarettes   Smokeless tobacco: Never  Substance and Sexual Activity   Alcohol use: Not Currently    Comment: Refuses to disclose how much   Drug use: No    Comment: Refuses to answer   Sexual activity: Not Currently    Comment: refused to answer  Other Topics Concern   Not on file  Social History Narrative   Not on file   Social Determinants of Health   Financial Resource Strain: Not on file  Food Insecurity: No Food Insecurity (04/08/2022)   Hunger Vital Sign    Worried About Running Out of Food in the Last Year: Never true    Ran Out of Food in the Last Year: Never true  Transportation Needs: No Transportation Needs (04/08/2022)   PRAPARE - Hydrologist (Medical): No    Lack of Transportation (Non-Medical): No  Physical Activity: Not on file  Stress: Not on file  Social Connections: Not on file   Additional Social History:                         Sleep: Fair  Appetite:  Fair  Current Medications: Current Facility-Administered Medications  Medication Dose Route Frequency Provider Last Rate Last Admin   acetaminophen (TYLENOL) tablet 650 mg  650 mg Oral Q6H PRN [name] T, MD       alum & mag hydroxide-simeth (MAALOX/MYLANTA) 200-200-20 MG/5ML suspension 30 mL  30  mL Oral Q4H PRN [name], Paula Denmark, MD       haloperidol (HALDOL) tablet 5 mg  5 mg Oral BID [name] T, MD   5 mg at 07/25/22 3016   Or   haloperidol lactate (HALDOL) injection 5 mg  5 mg Intramuscular BID [name] T, MD       lithium carbonate capsule 600 mg  600 mg Oral QHS [name] T, MD   600 mg at 07/24/22 2121   LORazepam (ATIVAN) tablet 1 mg  1 mg Oral BID [name] T, MD   1 mg at 07/25/22 0827   Or   LORazepam (ATIVAN) injection 1 mg  1 mg Intramuscular BID [name] T, MD       magnesium hydroxide (MILK OF MAGNESIA) suspension 30 mL  30 mL Oral Daily PRN [name] T, MD        mirtazapine (REMERON) tablet 30 mg  30 mg Oral QHS [name] T, MD   30 mg at 07/24/22 2121   multivitamin with minerals tablet 1 tablet  1 tablet Oral Daily [name], Paula Denmark, MD   1 tablet at 07/25/22 0827   QUEtiapine (SEROQUEL) tablet 400 mg  400 mg Oral QHS [name] T, MD   400 mg at 07/24/22 2121   thiamine (VITAMIN B1) tablet 100 mg  100 mg Oral Daily [name], Paula Denmark, MD   100 mg at 07/25/22 0827    Lab Results: No results found for this or any previous visit (from the past 48 hour(s)).  Blood Alcohol level:  Lab Results  Component Value Date   ETH <10 03/29/2022   ETH <10 09/12/2021    Metabolic Disorder Labs: Lab Results  Component Value Date   HGBA1C 5.6 10/21/2021   MPG 114 10/21/2021   MPG 111 10/02/2021   Lab Results  Component Value Date   PROLACTIN 13.6 04/16/2015   Lab Results  Component Value Date   CHOL 189 10/21/2021   TRIG 52 10/21/2021   HDL 50 10/21/2021   CHOLHDL 3.8 10/21/2021   VLDL 10 10/21/2021   LDLCALC 129 (H) 10/21/2021   LDLCALC 93 10/01/2020    Physical Findings: AIMS: Facial and Oral Movements Muscles of Facial Expression: None, normal Lips and Perioral Area: None, normal Jaw: None, normal Tongue: None, normal,Extremity Movements Upper (arms, wrists, hands, fingers): None, normal Lower (legs, knees, ankles, toes): None, normal, Trunk Movements Neck, shoulders, hips: None, normal, Overall Severity Severity of abnormal movements (highest score from questions above): None, normal Incapacitation due to abnormal movements: None, normal Patient's awareness of abnormal movements (rate only patient's report): No Awareness, Dental Status Current problems with teeth and/or dentures?: No Does patient usually wear dentures?: No  CIWA:    COWS:     Musculoskeletal: Strength & Muscle Tone: within normal limits Gait & Station: normal Patient leans: [name]  Psychiatric Specialty Exam:  Presentation  General Appearance:   Disheveled  Eye Contact: Fair  Speech: Pressured  Speech Volume: Increased  Handedness: Right   Mood and Affect  Mood: Irritable; Labile; Angry  Affect: Blunt; Labile   Thought Process  Thought Processes: Disorganized; Irrevelant  Descriptions of Associations:Loose  Orientation:Partial  Thought Content:Illogical; Rumination; Scattered; Tangential; Delusions  History of Schizophrenia/Schizoaffective disorder:Yes  Duration of Psychotic Symptoms:Greater than six months  Hallucinations:No data recorded Ideas of Reference:Percusatory; Paranoia  Suicidal Thoughts:No data recorded Homicidal Thoughts:No data recorded  Sensorium  Memory: Immediate Fair; Recent Fair; Remote Fair  Judgment: Impaired  Insight: Poor;  Lacking   Executive Functions  Concentration: Poor  Attention Span: Poor  Recall: Poor  Fund of Knowledge: Poor  Language: Poor   Psychomotor Activity  Psychomotor Activity:No data recorded  Assets  Assets: Physical Health; Resilience; Housing; Social Support   Sleep  Sleep:No data recorded   Physical Exam: Physical Exam Vitals and nursing note reviewed.  Constitutional:      Appearance: Normal appearance.  HENT:     Head: Normocephalic and atraumatic.     Mouth/Throat:     Pharynx: Oropharynx is clear.  Eyes:     Pupils: Pupils are equal, round, and reactive to light.  Cardiovascular:     Rate and Rhythm: Normal rate and regular rhythm.  Pulmonary:     Effort: Pulmonary effort is normal.     Breath sounds: Normal breath sounds.  Abdominal:     General: Abdomen is flat.     Palpations: Abdomen is soft.  Musculoskeletal:        General: Normal range of motion.  Skin:    General: Skin is warm and dry.  Neurological:     General: No focal deficit present.     Mental Status: She is alert. Mental status is at baseline.  Psychiatric:        Attention and Perception: She is inattentive.        Mood and Affect:  Mood normal. Affect is blunt.        Speech: Speech is delayed.        Behavior: Behavior normal.        Thought Content: Thought content is paranoid.    Review of Systems  Constitutional: Negative.   HENT: Negative.    Eyes: Negative.   Respiratory: Negative.    Cardiovascular: Negative.   Gastrointestinal: Negative.   Musculoskeletal: Negative.   Skin: Negative.   Neurological: Negative.   Psychiatric/Behavioral: Negative.     Blood pressure 112/71, pulse 61, temperature 97.8 F (36.6 C), temperature source Oral, resp. rate 18, height 5\' 4"  (1.626 m), weight 40.4 kg, SpO2 100 %. Body mass index is 15.28 kg/m.   Treatment Plan Summary: Medication management and Plan no change to medication.  Will continue to confer with the treatment team.  We may have to come to the point of forcing discharge and demanding pickup before legal action can be pursued.  We will reconsider this upcoming week.  , MD 07/25/2022, 12:17 PM

## 2022-07-25 NOTE — Plan of Care (Signed)
D- Patient alert and oriented. Patient presents in a pleasant mood on assessment stating that she slept good last night and had no complaints to voice to this Clinical research associate. Patient denies SI, HI, AVH, and pain at this time. Patient also denies any signs/symptoms of depression and anxiety. Patient's goal for today, per her self-inventory, is "discharge".    A- Scheduled medications administered to patient, per MD orders. Support and encouragement provided.  Routine safety checks conducted every 15 minutes.  Patient informed to notify staff with problems or concerns.   R- No adverse drug reactions noted. Patient contracts for safety at this time. Patient compliant with medications and treatment plan. Patient receptive, calm, and cooperative. Patient isolates to room, except for meals and medication. Patient remains safe at this time.  Problem: Education: Goal: Knowledge of General Education information will improve Description: Including pain rating scale, medication(s)/side effects and non-pharmacologic comfort measures Outcome: Progressing   Problem: Health Behavior/Discharge Planning: Goal: Ability to manage health-related needs will improve Outcome: Progressing   Problem: Clinical Measurements: Goal: Ability to maintain clinical measurements within normal limits will improve Outcome: Progressing Goal: Will remain free from infection Outcome: Progressing Goal: Diagnostic test results will improve Outcome: Progressing Goal: Respiratory complications will improve Outcome: Progressing Goal: Cardiovascular complication will be avoided Outcome: Progressing   Problem: Activity: Goal: Risk for activity intolerance will decrease Outcome: Progressing   Problem: Nutrition: Goal: Adequate nutrition will be maintained Outcome: Progressing   Problem: Coping: Goal: Level of anxiety will decrease Outcome: Progressing   Problem: Elimination: Goal: Will not experience complications related to bowel  motility Outcome: Progressing Goal: Will not experience complications related to urinary retention Outcome: Progressing   Problem: Pain Managment: Goal: General experience of comfort will improve Outcome: Progressing   Problem: Safety: Goal: Ability to remain free from injury will improve Outcome: Progressing   Problem: Skin Integrity: Goal: Risk for impaired skin integrity will decrease Outcome: Progressing   Problem: Activity: Goal: Will verbalize the importance of balancing activity with adequate rest periods Outcome: Progressing   Problem: Education: Goal: Will be free of psychotic symptoms Outcome: Progressing Goal: Knowledge of the prescribed therapeutic regimen will improve Outcome: Progressing   Problem: Coping: Goal: Coping ability will improve Outcome: Progressing Goal: Will verbalize feelings Outcome: Progressing   Problem: Health Behavior/Discharge Planning: Goal: Compliance with prescribed medication regimen will improve Outcome: Progressing   Problem: Nutritional: Goal: Ability to achieve adequate nutritional intake will improve Outcome: Progressing   Problem: Role Relationship: Goal: Ability to communicate needs accurately will improve Outcome: Progressing Goal: Ability to interact with others will improve Outcome: Progressing   Problem: Safety: Goal: Ability to redirect hostility and anger into socially appropriate behaviors will improve Outcome: Progressing Goal: Ability to remain free from injury will improve Outcome: Progressing   Problem: Self-Care: Goal: Ability to participate in self-care as condition permits will improve Outcome: Progressing   Problem: Self-Concept: Goal: Will verbalize positive feelings about self Outcome: Progressing   Problem: Education: Goal: Knowledge of [place] [facility] Education information/materials will improve Outcome: Progressing Goal: Emotional status will improve Outcome: Progressing Goal: Mental  status will improve Outcome: Progressing Goal: Verbalization of understanding the information provided will improve Outcome: Progressing   Problem: Activity: Goal: Interest or engagement in activities will improve Outcome: Progressing Goal: Sleeping patterns will improve Outcome: Progressing   Problem: Coping: Goal: Ability to verbalize frustrations and anger appropriately will improve Outcome: Progressing Goal: Ability to demonstrate self-control will improve Outcome: Progressing   Problem: Health Behavior/Discharge Planning: Goal:  Identification of resources available to assist in meeting health care needs will improve Outcome: Progressing Goal: Compliance with treatment plan for underlying cause of condition will improve Outcome: Progressing   Problem: Physical Regulation: Goal: Ability to maintain clinical measurements within normal limits will improve Outcome: Progressing   Problem: Safety: Goal: Periods of time without injury will increase Outcome: Progressing

## 2022-07-26 DIAGNOSIS — F251 Schizoaffective disorder, depressive type: Secondary | ICD-10-CM | POA: Diagnosis not present

## 2022-07-26 NOTE — Progress Notes (Signed)
[facility] MD Progress Note  07/26/2022 1:17 PM [name]  MRN:  [id number] Subjective: Follow-up 62 year old woman with schizoaffective disorder.  No change to presentation. Principal Problem: Schizoaffective disorder, depressive type ([facility]) Diagnosis: Principal Problem:   Schizoaffective disorder, depressive type ([facility])  Total Time spent with patient: 20 minutes  Past Psychiatric History: Past history of schizoaffective disorder depressive type  Past Medical History:  Past Medical History:  Diagnosis Date   Anemia 10/02/2021   Non compliance w medication regimen    Schizophrenia [facility])     Past Surgical History:  Procedure Laterality Date   BIOPSY  09/25/2021   Procedure: BIOPSY;  Surgeon: Rachael Fee, MD;  Location: [facility];  Service: Gastroenterology;;   ESOPHAGOGASTRODUODENOSCOPY (EGD) WITH PROPOFOL [name] 09/25/2021   Procedure: ESOPHAGOGASTRODUODENOSCOPY (EGD) WITH PROPOFOL;  Surgeon: Rachael Fee, MD;  Location: [facility];  Service: Gastroenterology;  Laterality: [name];  With NGT placement   Family History: History reviewed. No pertinent family history. Family Psychiatric  History: See previous Social History:  Social History   Substance and Sexual Activity  Alcohol Use Not Currently   Comment: Refuses to disclose how much     Social History   Substance and Sexual Activity  Drug Use No   Comment: Refuses to answer    Social History   Socioeconomic History   Marital status: Single    Spouse name: Not on file   Number of children: Not on file   Years of education: Not on file   Highest education level: Not on file  Occupational History   Not on file  Tobacco Use   Smoking status: Every Day    Packs/day: 2.00    Types: Cigarettes   Smokeless tobacco: Never  Substance and Sexual Activity   Alcohol use: Not Currently    Comment: Refuses to disclose how much   Drug use: No    Comment: Refuses to answer   Sexual activity: Not Currently    Comment: refused  to answer  Other Topics Concern   Not on file  Social History Narrative   Not on file   Social Determinants of Health   Financial Resource Strain: Not on file  Food Insecurity: No Food Insecurity (04/08/2022)   Hunger Vital Sign    Worried About Running Out of Food in the Last Year: Never true    Ran Out of Food in the Last Year: Never true  Transportation Needs: No Transportation Needs (04/08/2022)   PRAPARE - Administrator, Civil Service (Medical): No    Lack of Transportation (Non-Medical): No  Physical Activity: Not on file  Stress: Not on file  Social Connections: Not on file   Additional Social History:                         Sleep: Fair  Appetite:  Fair  Current Medications: Current Facility-Administered Medications  Medication Dose Route Frequency Provider Last Rate Last Admin   acetaminophen (TYLENOL) tablet 650 mg  650 mg Oral Q6H PRN Dorothy Polhemus T, MD       alum & mag hydroxide-simeth (MAALOX/MYLANTA) 200-200-20 MG/5ML suspension 30 mL  30 mL Oral Q4H PRN Indalecio Malmstrom T, MD       haloperidol (HALDOL) tablet 5 mg  5 mg Oral BID Naureen Benton, [name], MD   5 mg at 07/26/22 4098   Or   haloperidol lactate (HALDOL) injection 5 mg  5 mg Intramuscular BID Javares Kaufhold T,  MD       lithium carbonate capsule 600 mg  600 mg Oral QHS Keilan Nichol T, MD   600 mg at 07/25/22 2113   LORazepam (ATIVAN) tablet 1 mg  1 mg Oral BID Laquon Emel T, MD   1 mg at 07/26/22 1779   Or   LORazepam (ATIVAN) injection 1 mg  1 mg Intramuscular BID Genene Kilman T, MD       magnesium hydroxide (MILK OF MAGNESIA) suspension 30 mL  30 mL Oral Daily PRN Jaylene Schrom T, MD       mirtazapine (REMERON) tablet 30 mg  30 mg Oral QHS Demarus Latterell T, MD   30 mg at 07/25/22 2114   multivitamin with minerals tablet 1 tablet  1 tablet Oral Daily Areeba Sulser, [name], MD   1 tablet at 07/26/22 0853   QUEtiapine (SEROQUEL) tablet 400 mg  400 mg Oral QHS Khiry Pasquariello T, MD   400 mg at  07/25/22 2114   thiamine (VITAMIN B1) tablet 100 mg  100 mg Oral Daily Haeleigh Streiff, [name], MD   100 mg at 07/26/22 3903    Lab Results: No results found for this or any previous visit (from the past 48 hour(s)).  Blood Alcohol level:  Lab Results  Component Value Date   ETH <10 03/29/2022   ETH <10 00/92/3300    Metabolic Disorder Labs: Lab Results  Component Value Date   HGBA1C 5.6 10/21/2021   MPG 114 10/21/2021   MPG 111 10/02/2021   Lab Results  Component Value Date   PROLACTIN 13.6 04/16/2015   Lab Results  Component Value Date   CHOL 189 10/21/2021   TRIG 52 10/21/2021   HDL 50 10/21/2021   CHOLHDL 3.8 10/21/2021   VLDL 10 10/21/2021   LDLCALC 129 (H) 10/21/2021   LDLCALC 93 10/01/2020    Physical Findings: AIMS: Facial and Oral Movements Muscles of Facial Expression: None, normal Lips and Perioral Area: None, normal Jaw: None, normal Tongue: None, normal,Extremity Movements Upper (arms, wrists, hands, fingers): None, normal Lower (legs, knees, ankles, toes): None, normal, Trunk Movements Neck, shoulders, hips: None, normal, Overall Severity Severity of abnormal movements (highest score from questions above): None, normal Incapacitation due to abnormal movements: None, normal Patient's awareness of abnormal movements (rate only patient's report): No Awareness, Dental Status Current problems with teeth and/or dentures?: No Does patient usually wear dentures?: No  CIWA:    COWS:     Musculoskeletal: Strength & Muscle Tone: within normal limits Gait & Station: normal Patient leans: [name]  Psychiatric Specialty Exam:  Presentation  General Appearance:  Disheveled  Eye Contact: Fair  Speech: Pressured  Speech Volume: Increased  Handedness: Right   Mood and Affect  Mood: Irritable; Labile; Angry  Affect: Blunt; Labile   Thought Process  Thought Processes: Disorganized; Irrevelant  Descriptions of  Associations:Loose  Orientation:Partial  Thought Content:Illogical; Rumination; Scattered; Tangential; Delusions  History of Schizophrenia/Schizoaffective disorder:Yes  Duration of Psychotic Symptoms:Greater than six months  Hallucinations:No data recorded Ideas of Reference:Percusatory; Paranoia  Suicidal Thoughts:No data recorded Homicidal Thoughts:No data recorded  Sensorium  Memory: Immediate Fair; Recent Fair; Remote Fair  Judgment: Impaired  Insight: Poor; Lacking   Executive Functions  Concentration: Poor  Attention Span: Poor  Recall: Poor  Fund of Knowledge: Poor  Language: Poor   Psychomotor Activity  Psychomotor Activity:No data recorded  Assets  Assets: Physical Health; Resilience; Housing; Social Support   Sleep  Sleep:No data recorded   Physical Exam: Physical  Exam Vitals and nursing note reviewed.  Constitutional:      Appearance: Normal appearance.  HENT:     Head: Normocephalic and atraumatic.     Mouth/Throat:     Pharynx: Oropharynx is clear.  Eyes:     Pupils: Pupils are equal, round, and reactive to light.  Cardiovascular:     Rate and Rhythm: Normal rate and regular rhythm.  Pulmonary:     Effort: Pulmonary effort is normal.     Breath sounds: Normal breath sounds.  Abdominal:     General: Abdomen is flat.     Palpations: Abdomen is soft.  Musculoskeletal:        General: Normal range of motion.  Skin:    General: Skin is warm and dry.  Neurological:     General: No focal deficit present.     Mental Status: She is alert. Mental status is at baseline.  Psychiatric:        Attention and Perception: She is inattentive.        Mood and Affect: Mood normal. Affect is blunt.        Speech: Speech is delayed.        Thought Content: Thought content normal.    Review of Systems  Constitutional: Negative.   HENT: Negative.    Eyes: Negative.   Respiratory: Negative.    Cardiovascular: Negative.    Gastrointestinal: Negative.   Musculoskeletal: Negative.   Skin: Negative.   Neurological: Negative.   Psychiatric/Behavioral: Negative.     Blood pressure 116/72, pulse 63, temperature 98 F (36.7 C), temperature source Oral, resp. rate 18, height 5\' 4"  (1.626 m), weight 40.4 kg, SpO2 99 %. Body mass index is 15.28 kg/m.   Treatment Plan Summary: Medication management and Plan no change to medication.  Supportive counseling and encouragement.  Team is continuing to work on discharge plan  , MD 07/26/2022, 1:17 PM

## 2022-07-26 NOTE — Progress Notes (Signed)
Has been out in the dayroom this evening, but remains isolative to herself. She denies si/hi/avh.  Presents with flat and depressed affect. Reports being ready to go home.  Continues to deny having a sister and that sister being her guardian. She is med compliant and takes her medication without issue. Q 15 minute safety checks in place. Encouraged her to come to staff with any concerns.    C Butler-Nicholson, LPN

## 2022-07-26 NOTE — Plan of Care (Signed)
  Problem: Education: Goal: Knowledge of General Education information will improve Description: Including pain rating scale, medication(s)/side effects and non-pharmacologic comfort measures Outcome: Progressing   Problem: Health Behavior/Discharge Planning: Goal: Ability to manage health-related needs will improve Outcome: Progressing   Problem: Clinical Measurements: Goal: Ability to maintain clinical measurements within normal limits will improve Outcome: Progressing Goal: Will remain free from infection Outcome: Progressing Goal: Diagnostic test results will improve Outcome: Progressing Goal: Respiratory complications will improve Outcome: Progressing Goal: Cardiovascular complication will be avoided Outcome: Progressing   Problem: Safety: Goal: Periods of time without injury will increase Outcome: Progressing   Problem: Physical Regulation: Goal: Ability to maintain clinical measurements within normal limits will improve Outcome: Progressing   Problem: Health Behavior/Discharge Planning: Goal: Identification of resources available to assist in meeting health care needs will improve Outcome: Progressing Goal: Compliance with treatment plan for underlying cause of condition will improve Outcome: Progressing   

## 2022-07-26 NOTE — Plan of Care (Signed)
Pt reports god sleep without the aid of medication.  Good appetite and concentration, normal energy reported by pt.  Denies hopelesness, depression, anxiety, SI, HI, AVH.  Pt denies pain or physical problems.  Compliant with medications.  Continued monitoring via q 15 minute checks.

## 2022-07-27 DIAGNOSIS — F251 Schizoaffective disorder, depressive type: Secondary | ICD-10-CM | POA: Diagnosis not present

## 2022-07-27 NOTE — Progress Notes (Signed)
[facility] MD Progress Note  07/27/2022 11:37 AM [name]  MRN:  [id number] Subjective: Follow-up patient with schizoaffective disorder.  Patient is unchanged clinically.  Blunted affect but not agitated.  Eats and takes care of basic hygiene.  Not aggressive.  Family continues to decline to take the patient home and we have been unable to find any plan for alternative disposition Principal Problem: Schizoaffective disorder, depressive type (HCC) Diagnosis: Principal Problem:   Schizoaffective disorder, depressive type (HCC)  Total Time spent with patient: 30 minutes  Past Psychiatric History: Past history of schizoaffective disorder  Past Medical History:  Past Medical History:  Diagnosis Date   Anemia 10/02/2021   Non compliance w medication regimen    Schizophrenia (HCC)     Past Surgical History:  Procedure Laterality Date   BIOPSY  09/25/2021   Procedure: BIOPSY;  Surgeon: Rachael Fee, MD;  Location: [facility];  Service: Gastroenterology;;   ESOPHAGOGASTRODUODENOSCOPY (EGD) WITH PROPOFOL [name] 09/25/2021   Procedure: ESOPHAGOGASTRODUODENOSCOPY (EGD) WITH PROPOFOL;  Surgeon: Rachael Fee, MD;  Location: [facility];  Service: Gastroenterology;  Laterality: [name];  With NGT placement   Family History: History reviewed. No pertinent family history. Family Psychiatric  History: See previous Social History:  Social History   Substance and Sexual Activity  Alcohol Use Not Currently   Comment: Refuses to disclose how much     Social History   Substance and Sexual Activity  Drug Use No   Comment: Refuses to answer    Social History   Socioeconomic History   Marital status: Single    Spouse name: Not on file   Number of children: Not on file   Years of education: Not on file   Highest education level: Not on file  Occupational History   Not on file  Tobacco Use   Smoking status: Every Day    Packs/day: 2.00    Types: Cigarettes   Smokeless tobacco: Never  Substance  and Sexual Activity   Alcohol use: Not Currently    Comment: Refuses to disclose how much   Drug use: No    Comment: Refuses to answer   Sexual activity: Not Currently    Comment: refused to answer  Other Topics Concern   Not on file  Social History Narrative   Not on file   Social Determinants of Health   Financial Resource Strain: Not on file  Food Insecurity: No Food Insecurity (04/08/2022)   Hunger Vital Sign    Worried About Running Out of Food in the Last Year: Never true    Ran Out of Food in the Last Year: Never true  Transportation Needs: No Transportation Needs (04/08/2022)   PRAPARE - Administrator, Civil Service (Medical): No    Lack of Transportation (Non-Medical): No  Physical Activity: Not on file  Stress: Not on file  Social Connections: Not on file   Additional Social History:                         Sleep: Fair  Appetite:  Fair  Current Medications: Current Facility-Administered Medications  Medication Dose Route Frequency Provider Last Rate Last Admin   acetaminophen (TYLENOL) tablet 650 mg  650 mg Oral Q6H PRN Kasheena Sambrano T, MD       alum & mag hydroxide-simeth (MAALOX/MYLANTA) 200-200-20 MG/5ML suspension 30 mL  30 mL Oral Q4H PRN Mabelle Mungin, [name], MD       haloperidol (HALDOL) tablet 5  mg  5 mg Oral BID Kylen Schliep T, MD   5 mg at 07/27/22 0845   Or   haloperidol lactate (HALDOL) injection 5 mg  5 mg Intramuscular BID Britteny Fiebelkorn T, MD       lithium carbonate capsule 600 mg  600 mg Oral QHS Hanadi Stanly T, MD   600 mg at 07/26/22 2115   LORazepam (ATIVAN) tablet 1 mg  1 mg Oral BID Chioma Mukherjee T, MD   1 mg at 07/27/22 0845   Or   LORazepam (ATIVAN) injection 1 mg  1 mg Intramuscular BID Judiann Celia T, MD       magnesium hydroxide (MILK OF MAGNESIA) suspension 30 mL  30 mL Oral Daily PRN Alma Muegge T, MD       mirtazapine (REMERON) tablet 30 mg  30 mg Oral QHS Thais Silberstein T, MD   30 mg at 07/26/22 2116    multivitamin with minerals tablet 1 tablet  1 tablet Oral Daily Monterrio Gerst, [name], MD   1 tablet at 07/27/22 0845   QUEtiapine (SEROQUEL) tablet 400 mg  400 mg Oral QHS Jaline Pincock T, MD   400 mg at 07/26/22 2115   thiamine (VITAMIN B1) tablet 100 mg  100 mg Oral Daily Saniyya Gau, [name], MD   100 mg at 07/27/22 0845    Lab Results: No results found for this or any previous visit (from the past 48 hour(s)).  Blood Alcohol level:  Lab Results  Component Value Date   ETH <10 03/29/2022   ETH <10 09/12/2021    Metabolic Disorder Labs: Lab Results  Component Value Date   HGBA1C 5.6 10/21/2021   MPG 114 10/21/2021   MPG 111 10/02/2021   Lab Results  Component Value Date   PROLACTIN 13.6 04/16/2015   Lab Results  Component Value Date   CHOL 189 10/21/2021   TRIG 52 10/21/2021   HDL 50 10/21/2021   CHOLHDL 3.8 10/21/2021   VLDL 10 10/21/2021   LDLCALC 129 (H) 10/21/2021   LDLCALC 93 10/01/2020    Physical Findings: AIMS: Facial and Oral Movements Muscles of Facial Expression: None, normal Lips and Perioral Area: None, normal Jaw: None, normal Tongue: None, normal,Extremity Movements Upper (arms, wrists, hands, fingers): None, normal Lower (legs, knees, ankles, toes): None, normal, Trunk Movements Neck, shoulders, hips: None, normal, Overall Severity Severity of abnormal movements (highest score from questions above): None, normal Incapacitation due to abnormal movements: None, normal Patient's awareness of abnormal movements (rate only patient's report): No Awareness, Dental Status Current problems with teeth and/or dentures?: No Does patient usually wear dentures?: No  CIWA:    COWS:     Musculoskeletal: Strength & Muscle Tone: within normal limits Gait & Station: normal Patient leans: [name]  Psychiatric Specialty Exam:  Presentation  General Appearance:  Disheveled  Eye Contact: Fair  Speech: Pressured  Speech  Volume: Increased  Handedness: Right   Mood and Affect  Mood: Irritable; Labile; Angry  Affect: Blunt; Labile   Thought Process  Thought Processes: Disorganized; Irrevelant  Descriptions of Associations:Loose  Orientation:Partial  Thought Content:Illogical; Rumination; Scattered; Tangential; Delusions  History of Schizophrenia/Schizoaffective disorder:Yes  Duration of Psychotic Symptoms:Greater than six months  Hallucinations:No data recorded Ideas of Reference:Percusatory; Paranoia  Suicidal Thoughts:No data recorded Homicidal Thoughts:No data recorded  Sensorium  Memory: Immediate Fair; Recent Fair; Remote Fair  Judgment: Impaired  Insight: Poor; Lacking   Executive Functions  Concentration: Poor  Attention Span: Poor  Recall: Poor  Fund of  Knowledge: Poor  Language: Poor   Psychomotor Activity  Psychomotor Activity:No data recorded  Assets  Assets: Physical Health; Resilience; Housing; Social Support   Sleep  Sleep:No data recorded   Physical Exam: Physical Exam Vitals and nursing note reviewed.  Constitutional:      Appearance: Normal appearance.  HENT:     Head: Normocephalic and atraumatic.     Mouth/Throat:     Pharynx: Oropharynx is clear.  Eyes:     Pupils: Pupils are equal, round, and reactive to light.  Cardiovascular:     Rate and Rhythm: Normal rate and regular rhythm.  Pulmonary:     Effort: Pulmonary effort is normal.     Breath sounds: Normal breath sounds.  Abdominal:     General: Abdomen is flat.     Palpations: Abdomen is soft.  Musculoskeletal:        General: Normal range of motion.  Skin:    General: Skin is warm and dry.  Neurological:     General: No focal deficit present.     Mental Status: She is alert. Mental status is at baseline.  Psychiatric:        Attention and Perception: She is inattentive.        Mood and Affect: Mood normal. Affect is blunt.        Speech: She is  noncommunicative.    Review of Systems  Constitutional: Negative.   HENT: Negative.    Eyes: Negative.   Respiratory: Negative.    Cardiovascular: Negative.   Gastrointestinal: Negative.   Musculoskeletal: Negative.   Skin: Negative.   Neurological: Negative.   Psychiatric/Behavioral: Negative.     Blood pressure 100/64, pulse 62, temperature 98 F (36.7 C), temperature source Oral, resp. rate 18, height 5\' 4"  (1.626 m), weight 40.4 kg, SpO2 98 %. Body mass index is 15.28 kg/m.   Treatment Plan Summary: Medication management and Plan no change to medication management.  Treatment team is continuing to work on options for disposition  Alethia Berthold, MD 07/27/2022, 11:37 AM

## 2022-07-27 NOTE — Progress Notes (Signed)
Recreation Therapy Notes    Date: 07/27/2022  Time: 10:00 am  Location: Craft room     Behavioral response: [name]   Intervention Topic: Coping Skills   Discussion/Intervention: Patient refused to attend group.   Clinical Observations/Feedback:  Patient refused to attend group.    [name] LRT/CTRS          [name] 07/27/2022 1:04 PM 

## 2022-07-27 NOTE — Plan of Care (Signed)
Patient calm and cooperative on approach. Denies SI,HI and AVH. Visible in the milieu around meal time. No issues verbalized. Support and encouragement given.

## 2022-07-27 NOTE — Progress Notes (Signed)
Patient isolative to her room. Does come out for snack and engages with peers while out. She is med compliant and denies si/hi/avh.  She also denies anxiety and depression.  She is med compliant.  Reports eating and sleeping well. Has no issue or concerns that she needed to address at this encounter.  Will continue to monitor.  Q 15 minute safety checks in place.       C Butler-Nicholson, LPN

## 2022-07-27 NOTE — Plan of Care (Signed)
  Problem: Education: Goal: Knowledge of General Education information will improve Description: Including pain rating scale, medication(s)/side effects and non-pharmacologic comfort measures Outcome: Progressing   Problem: Health Behavior/Discharge Planning: Goal: Ability to manage health-related needs will improve Outcome: Progressing   Problem: Clinical Measurements: Goal: Ability to maintain clinical measurements within normal limits will improve Outcome: Progressing Goal: Will remain free from infection Outcome: Progressing Goal: Diagnostic test results will improve Outcome: Progressing Goal: Respiratory complications will improve Outcome: Progressing Goal: Cardiovascular complication will be avoided Outcome: Progressing   Problem: Safety: Goal: Periods of time without injury will increase Outcome: Progressing   Problem: Physical Regulation: Goal: Ability to maintain clinical measurements within normal limits will improve Outcome: Progressing   Problem: Health Behavior/Discharge Planning: Goal: Identification of resources available to assist in meeting health care needs will improve Outcome: Progressing Goal: Compliance with treatment plan for underlying cause of condition will improve Outcome: Progressing   Problem: Coping: Goal: Ability to verbalize frustrations and anger appropriately will improve Outcome: Progressing Goal: Ability to demonstrate self-control will improve Outcome: Progressing   Problem: Activity: Goal: Interest or engagement in activities will improve Outcome: Progressing Goal: Sleeping patterns will improve Outcome: Progressing   Problem: Education: Goal: Knowledge of [facility] General Education information/materials will improve Outcome: Progressing Goal: Emotional status will improve Outcome: Progressing Goal: Mental status will improve Outcome: Progressing Goal: Verbalization of understanding the information provided will  improve Outcome: Progressing   

## 2022-07-28 DIAGNOSIS — F251 Schizoaffective disorder, depressive type: Secondary | ICD-10-CM | POA: Diagnosis not present

## 2022-07-28 NOTE — Plan of Care (Signed)
  Problem: Nutrition: Goal: Adequate nutrition will be maintained Outcome: Progressing   Problem: Pain Managment: Goal: General experience of comfort will improve Outcome: Progressing   Problem: Activity: Goal: Risk for activity intolerance will decrease Outcome: Not Progressing   

## 2022-07-28 NOTE — Progress Notes (Signed)
Pt denies SI/HI/AVH and verbally agrees to approach staff if these become apparent or before harming themselves/others. Rates depression 0/10. Rates anxiety 0/10. Rates pain 0/10. Scheduled medications administered to pt, per MD orders. RN provided support and encouragement to pt. Q15 min safety checks implemented and continued. Pt safe on the unit. RN will continue to monitor and intervene as needed.  07/28/22 0818  Psych Admission Type (Psych Patients Only)  Admission Status Involuntary  Psychosocial Assessment  Patient Complaints None  Eye Contact Fair  Facial Expression Flat  Affect Sad  Speech Logical/coherent  Interaction Isolative  Motor Activity Slow  Appearance/Hygiene Unremarkable;In scrubs  Behavior Characteristics Cooperative;Appropriate to situation;Calm  Mood Sad;Pleasant  Thought Process  Coherency WDL  Content WDL  Delusions None reported or observed  Perception WDL  Hallucination None reported or observed  Judgment Impaired  Confusion None  Danger to Self  Current suicidal ideation? Denies  Danger to Others  Danger to Others None reported or observed  Danger to Others Abnormal  Harmful Behavior to others No threats or harm toward other people  Destructive Behavior No threats or harm toward property

## 2022-07-28 NOTE — Progress Notes (Signed)
[name] Progress Note  07/28/2022 12:18 PM [name]  MRN:  [id number] Subjective: Follow-up 62 year old woman with schizoaffective disorder.  Patient has no new complaints.  Behavior has been stable.  Takes care of ADLs well.  Health seems stable. Principal Problem: Schizoaffective disorder, depressive type (HCC) Diagnosis: Principal Problem:   Schizoaffective disorder, depressive type (HCC)  Total Time spent with patient: 30 minutes  Past Psychiatric History: Past history of schizoaffective disorder  Past Medical History:  Past Medical History:  Diagnosis Date   Anemia 10/02/2021   Non compliance w medication regimen    Schizophrenia [facility])     Past Surgical History:  Procedure Laterality Date   BIOPSY  09/25/2021   Procedure: BIOPSY;  Surgeon: Rachael Fee, MD;  Location: [facility];  Service: Gastroenterology;;   ESOPHAGOGASTRODUODENOSCOPY (EGD) WITH PROPOFOL [name] 09/25/2021   Procedure: ESOPHAGOGASTRODUODENOSCOPY (EGD) WITH PROPOFOL;  Surgeon: Rachael Fee, MD;  Location: [facility];  Service: Gastroenterology;  Laterality: [name];  With NGT placement   Family History: History reviewed. No pertinent family history. Family Psychiatric  History: See previous Social History:  Social History   Substance and Sexual Activity  Alcohol Use Not Currently   Comment: Refuses to disclose how much     Social History   Substance and Sexual Activity  Drug Use No   Comment: Refuses to answer    Social History   Socioeconomic History   Marital status: Single    Spouse name: Not on file   Number of children: Not on file   Years of education: Not on file   Highest education level: Not on file  Occupational History   Not on file  Tobacco Use   Smoking status: Every Day    Packs/day: 2.00    Types: Cigarettes   Smokeless tobacco: Never  Substance and Sexual Activity   Alcohol use: Not Currently    Comment: Refuses to disclose how much   Drug use: No    Comment: Refuses  to answer   Sexual activity: Not Currently    Comment: refused to answer  Other Topics Concern   Not on file  Social History Narrative   Not on file   Social Determinants of Health   Financial Resource Strain: Not on file  Food Insecurity: No Food Insecurity (04/08/2022)   Hunger Vital Sign    Worried About Running Out of Food in the Last Year: Never true    Ran Out of Food in the Last Year: Never true  Transportation Needs: No Transportation Needs (04/08/2022)   PRAPARE - Administrator, Civil Service (Medical): No    Lack of Transportation (Non-Medical): No  Physical Activity: Not on file  Stress: Not on file  Social Connections: Not on file   Additional Social History:                         Sleep: Fair  Appetite:  Fair  Current Medications: Current Facility-Administered Medications  Medication Dose Route Frequency Provider Last Rate Last Admin   acetaminophen (TYLENOL) tablet 650 mg  650 mg Oral Q6H PRN Ephram Kornegay T, MD       alum & mag hydroxide-simeth (MAALOX/MYLANTA) 200-200-20 MG/5ML suspension 30 mL  30 mL Oral Q4H PRN Amoy Steeves T, MD       haloperidol (HALDOL) tablet 5 mg  5 mg Oral BID Breslin Hemann, [name], MD   5 mg at 07/28/22 0818   Or  haloperidol lactate (HALDOL) injection 5 mg  5 mg Intramuscular BID Baylei Siebels T, MD       lithium carbonate capsule 600 mg  600 mg Oral QHS Shamiracle Gorden T, MD   600 mg at 07/27/22 2134   LORazepam (ATIVAN) tablet 1 mg  1 mg Oral BID Yuri Fana T, MD   1 mg at 07/28/22 0818   Or   LORazepam (ATIVAN) injection 1 mg  1 mg Intramuscular BID Demitrius Crass T, MD       magnesium hydroxide (MILK OF MAGNESIA) suspension 30 mL  30 mL Oral Daily PRN Daelyn Pettaway T, MD       mirtazapine (REMERON) tablet 30 mg  30 mg Oral QHS Roth Ress T, MD   30 mg at 07/27/22 2134   multivitamin with minerals tablet 1 tablet  1 tablet Oral Daily Kline Bulthuis, [name], MD   1 tablet at 07/28/22 0818   QUEtiapine (SEROQUEL)  tablet 400 mg  400 mg Oral QHS Amarylis Rovito T, MD   400 mg at 07/27/22 2134   thiamine (VITAMIN B1) tablet 100 mg  100 mg Oral Daily Court Gracia, [name], MD   100 mg at 07/28/22 0818    Lab Results: No results found for this or any previous visit (from the past 48 hour(s)).  Blood Alcohol level:  Lab Results  Component Value Date   ETH <10 03/29/2022   ETH <10 09/12/2021    Metabolic Disorder Labs: Lab Results  Component Value Date   HGBA1C 5.6 10/21/2021   MPG 114 10/21/2021   MPG 111 10/02/2021   Lab Results  Component Value Date   PROLACTIN 13.6 04/16/2015   Lab Results  Component Value Date   CHOL 189 10/21/2021   TRIG 52 10/21/2021   HDL 50 10/21/2021   CHOLHDL 3.8 10/21/2021   VLDL 10 10/21/2021   LDLCALC 129 (H) 10/21/2021   LDLCALC 93 10/01/2020    Physical Findings: AIMS: Facial and Oral Movements Muscles of Facial Expression: None, normal Lips and Perioral Area: None, normal Jaw: None, normal Tongue: None, normal,Extremity Movements Upper (arms, wrists, hands, fingers): None, normal Lower (legs, knees, ankles, toes): None, normal, Trunk Movements Neck, shoulders, hips: None, normal, Overall Severity Severity of abnormal movements (highest score from questions above): None, normal Incapacitation due to abnormal movements: None, normal Patient's awareness of abnormal movements (rate only patient's report): No Awareness, Dental Status Current problems with teeth and/or dentures?: No Does patient usually wear dentures?: No  CIWA:    COWS:     Musculoskeletal: Strength & Muscle Tone: within normal limits Gait & Station: normal Patient leans: [name]  Psychiatric Specialty Exam:  Presentation  General Appearance:  Disheveled  Eye Contact: Fair  Speech: Pressured  Speech Volume: Increased  Handedness: Right   Mood and Affect  Mood: Irritable; Labile; Angry  Affect: Blunt; Labile   Thought Process  Thought Processes: Disorganized;  Irrevelant  Descriptions of Associations:Loose  Orientation:Partial  Thought Content:Illogical; Rumination; Scattered; Tangential; Delusions  History of Schizophrenia/Schizoaffective disorder:Yes  Duration of Psychotic Symptoms:Greater than six months  Hallucinations:No data recorded Ideas of Reference:Percusatory; Paranoia  Suicidal Thoughts:No data recorded Homicidal Thoughts:No data recorded  Sensorium  Memory: Immediate Fair; Recent Fair; Remote Fair  Judgment: Impaired  Insight: Poor; Lacking   Executive Functions  Concentration: Poor  Attention Span: Poor  Recall: Poor  Fund of Knowledge: Poor  Language: Poor   Psychomotor Activity  Psychomotor Activity:No data recorded  Assets  Assets: Physical Health; Resilience; Housing;  Social Support   Sleep  Sleep:No data recorded   Physical Exam: Physical Exam Vitals and nursing note reviewed.  Constitutional:      Appearance: Normal appearance.  HENT:     Head: Normocephalic and atraumatic.     Mouth/Throat:     Pharynx: Oropharynx is clear.  Eyes:     Pupils: Pupils are equal, round, and reactive to light.  Cardiovascular:     Rate and Rhythm: Normal rate and regular rhythm.  Pulmonary:     Effort: Pulmonary effort is normal.     Breath sounds: Normal breath sounds.  Abdominal:     General: Abdomen is flat.     Palpations: Abdomen is soft.  Musculoskeletal:        General: Normal range of motion.  Skin:    General: Skin is warm and dry.  Neurological:     General: No focal deficit present.     Mental Status: She is alert. Mental status is at baseline.  Psychiatric:        Attention and Perception: Attention normal.        Mood and Affect: Mood normal. Affect is blunt.        Speech: She is noncommunicative.        Behavior: Behavior is withdrawn.        Thought Content: Thought content is paranoid.    Review of Systems  Constitutional: Negative.   HENT: Negative.    Eyes:  Negative.   Respiratory: Negative.    Cardiovascular: Negative.   Gastrointestinal: Negative.   Musculoskeletal: Negative.   Skin: Negative.   Neurological: Negative.   Psychiatric/Behavioral: Negative.     Blood pressure 98/70, pulse 60, temperature 98.6 F (37 C), temperature source Oral, resp. rate 18, height 5\' 4"  (1.626 m), weight 40.4 kg, SpO2 97 %. Body mass index is 15.28 kg/m.   Treatment Plan Summary: Medication management and Plan anticipate no change to medicine for now.  We are continuing to work on discharge [place], MD 07/28/2022, 12:18 PM

## 2022-07-28 NOTE — Group Note (Signed)
[facility] LCSW Group Therapy Note    Group Date: 07/28/2022 Start Time: 1300 End Time: 1400  Type of Therapy and Topic:  Group Therapy:  Overcoming Obstacles  Participation Level:  [facility] PARTICIPATION LEVEL: Did Not Attend  Mood:  Description of Group:   In this group patients will be encouraged to explore what they see as obstacles to their own wellness and recovery. They will be guided to discuss their thoughts, feelings, and behaviors related to these obstacles. The group will process together ways to cope with barriers, with attention given to specific choices patients can make. Each patient will be challenged to identify changes they are motivated to make in order to overcome their obstacles. This group will be process-oriented, with patients participating in exploration of their own experiences as well as giving and receiving support and challenge from other group members.  Therapeutic Goals: 1. Patient will identify personal and current obstacles as they relate to admission. 2. Patient will identify barriers that currently interfere with their wellness or overcoming obstacles.  3. Patient will identify feelings, thought process and behaviors related to these barriers. 4. Patient will identify two changes they are willing to make to overcome these obstacles:    Summary of Patient Progress Patient declined to attend group despite encouragement for this CSW.   Therapeutic Modalities:   Cognitive Behavioral Therapy Solution Focused Therapy Motivational Interviewing Relapse Prevention Therapy   Rozann Lesches, LCSW

## 2022-07-28 NOTE — Plan of Care (Signed)
Patient pleasant and cooperative on assessment.  Patient is medication compliant.  Patient out of room for snack but isolated to self.  Patient denies anxiety, depression, SI/HI and AVH.  Patient does not appear to be responding to stimuli.  Patient denies pain.  Patient can contract for safety. Q 15 minute rounds in progress, will continue to monitor. Problem: Education: Goal: Knowledge of General Education information will improve Description: Including pain rating scale, medication(s)/side effects and non-pharmacologic comfort measures Outcome: Progressing   Problem: Health Behavior/Discharge Planning: Goal: Ability to manage health-related needs will improve Outcome: Progressing   Problem: Clinical Measurements: Goal: Will remain free from infection Outcome: Progressing   Problem: Nutrition: Goal: Adequate nutrition will be maintained Outcome: Progressing   Problem: Elimination: Goal: Will not experience complications related to bowel motility Outcome: Progressing   Problem: Safety: Goal: Ability to remain free from injury will improve Outcome: Progressing   Problem: Education: Goal: Will be free of psychotic symptoms Outcome: Progressing   Problem: Coping: Goal: Coping ability will improve Outcome: Progressing   Problem: Health Behavior/Discharge Planning: Goal: Compliance with prescribed medication regimen will improve Outcome: Progressing

## 2022-07-28 NOTE — Plan of Care (Signed)
Pt is calm and cooperative; flat affect, denies SI HI AVH, compliant with medications.  No complaints of distress.  Continued monitoring with q 15 minute observations.

## 2022-07-29 DIAGNOSIS — F251 Schizoaffective disorder, depressive type: Secondary | ICD-10-CM | POA: Diagnosis not present

## 2022-07-29 NOTE — Progress Notes (Signed)
Recreation Therapy Notes  Date: 07/29/2022  Time: 10:50 am  Location: Craft room     Behavioral response: [name]   Intervention Topic: Time Management   Discussion/Intervention: Patient refused to attend group.   Clinical Observations/Feedback:  Patient refused to attend group.    Tenea Sens LRT/CTRS        [name] 07/29/2022 1:37 PM 

## 2022-07-29 NOTE — Progress Notes (Signed)
[facility] MD Progress Note  07/29/2022 2:36 PM [name]  MRN:  [id number] Subjective: Follow-up for schizoaffective disorder.  No change to presentation.  Takes care of herself adequately and eats okay.  Comes out of her room.  Does not interact very much.  Unable to discuss discharge planning because of psychosis Principal Problem: Schizoaffective disorder, depressive type (HCC) Diagnosis: Principal Problem:   Schizoaffective disorder, depressive type (HCC)  Total Time spent with patient: 30 minutes  Past Psychiatric History: Past history of schizoaffective disorder  Past Medical History:  Past Medical History:  Diagnosis Date   Anemia 10/02/2021   Non compliance w medication regimen    Schizophrenia [facility])     Past Surgical History:  Procedure Laterality Date   BIOPSY  09/25/2021   Procedure: BIOPSY;  Surgeon: Rachael Fee, MD;  Location: [facility];  Service: Gastroenterology;;   ESOPHAGOGASTRODUODENOSCOPY (EGD) WITH PROPOFOL [name] 09/25/2021   Procedure: ESOPHAGOGASTRODUODENOSCOPY (EGD) WITH PROPOFOL;  Surgeon: Rachael Fee, MD;  Location: [facility];  Service: Gastroenterology;  Laterality: [name];  With NGT placement   Family History: History reviewed. No pertinent family history. Family Psychiatric  History: See previous Social History:  Social History   Substance and Sexual Activity  Alcohol Use Not Currently   Comment: Refuses to disclose how much     Social History   Substance and Sexual Activity  Drug Use No   Comment: Refuses to answer    Social History   Socioeconomic History   Marital status: Single    Spouse name: Not on file   Number of children: Not on file   Years of education: Not on file   Highest education level: Not on file  Occupational History   Not on file  Tobacco Use   Smoking status: Every Day    Packs/day: 2.00    Types: Cigarettes   Smokeless tobacco: Never  Substance and Sexual Activity   Alcohol use: Not Currently    Comment:  Refuses to disclose how much   Drug use: No    Comment: Refuses to answer   Sexual activity: Not Currently    Comment: refused to answer  Other Topics Concern   Not on file  Social History Narrative   Not on file   Social Determinants of Health   Financial Resource Strain: Not on file  Food Insecurity: No Food Insecurity (04/08/2022)   Hunger Vital Sign    Worried About Running Out of Food in the Last Year: Never true    Ran Out of Food in the Last Year: Never true  Transportation Needs: No Transportation Needs (04/08/2022)   PRAPARE - Administrator, Civil Service (Medical): No    Lack of Transportation (Non-Medical): No  Physical Activity: Not on file  Stress: Not on file  Social Connections: Not on file   Additional Social History:                         Sleep: Fair  Appetite:  Fair  Current Medications: Current Facility-Administered Medications  Medication Dose Route Frequency Provider Last Rate Last Admin   acetaminophen (TYLENOL) tablet 650 mg  650 mg Oral Q6H PRN Hatsue Sime T, MD       alum & mag hydroxide-simeth (MAALOX/MYLANTA) 200-200-20 MG/5ML suspension 30 mL  30 mL Oral Q4H PRN Oral Remache T, MD       haloperidol (HALDOL) tablet 5 mg  5 mg Oral BID Delray Reza, [name], MD  5 mg at 07/29/22 0932   Or   haloperidol lactate (HALDOL) injection 5 mg  5 mg Intramuscular BID Radley Barto T, MD       lithium carbonate capsule 600 mg  600 mg Oral QHS Daisy Mcneel T, MD   600 mg at 07/28/22 2116   LORazepam (ATIVAN) tablet 1 mg  1 mg Oral BID Keaden Gunnoe T, MD   1 mg at 07/29/22 6712   Or   LORazepam (ATIVAN) injection 1 mg  1 mg Intramuscular BID Kordelia Severin T, MD       magnesium hydroxide (MILK OF MAGNESIA) suspension 30 mL  30 mL Oral Daily PRN Roshard Rezabek T, MD       mirtazapine (REMERON) tablet 30 mg  30 mg Oral QHS Jozsef Wescoat T, MD   30 mg at 07/28/22 2116   multivitamin with minerals tablet 1 tablet  1 tablet Oral Daily Eryc Bodey,  [name], MD   1 tablet at 07/29/22 0813   QUEtiapine (SEROQUEL) tablet 400 mg  400 mg Oral QHS Leevi Cullars T, MD   400 mg at 07/28/22 2116   thiamine (VITAMIN B1) tablet 100 mg  100 mg Oral Daily Tagan Bartram, [name], MD   100 mg at 07/29/22 0813    Lab Results: No results found for this or any previous visit (from the past 31 hour(s)).  Blood Alcohol level:  Lab Results  Component Value Date   ETH <10 03/29/2022   ETH <10 45/80/9983    Metabolic Disorder Labs: Lab Results  Component Value Date   HGBA1C 5.6 10/21/2021   MPG 114 10/21/2021   MPG 111 10/02/2021   Lab Results  Component Value Date   PROLACTIN 13.6 04/16/2015   Lab Results  Component Value Date   CHOL 189 10/21/2021   TRIG 52 10/21/2021   HDL 50 10/21/2021   CHOLHDL 3.8 10/21/2021   VLDL 10 10/21/2021   LDLCALC 129 (H) 10/21/2021   LDLCALC 93 10/01/2020    Physical Findings: AIMS: Facial and Oral Movements Muscles of Facial Expression: None, normal Lips and Perioral Area: None, normal Jaw: None, normal Tongue: None, normal,Extremity Movements Upper (arms, wrists, hands, fingers): None, normal Lower (legs, knees, ankles, toes): None, normal, Trunk Movements Neck, shoulders, hips: None, normal, Overall Severity Severity of abnormal movements (highest score from questions above): None, normal Incapacitation due to abnormal movements: None, normal Patient's awareness of abnormal movements (rate only patient's report): No Awareness, Dental Status Current problems with teeth and/or dentures?: No Does patient usually wear dentures?: No  CIWA:    COWS:     Musculoskeletal: Strength & Muscle Tone: within normal limits Gait & Station: normal Patient leans: [name]  Psychiatric Specialty Exam:  Presentation  General Appearance:  Disheveled  Eye Contact: Fair  Speech: Pressured  Speech Volume: Increased  Handedness: Right   Mood and Affect  Mood: Irritable; Labile; Angry  Affect: Blunt;  Labile   Thought Process  Thought Processes: Disorganized; Irrevelant  Descriptions of Associations:Loose  Orientation:Partial  Thought Content:Illogical; Rumination; Scattered; Tangential; Delusions  History of Schizophrenia/Schizoaffective disorder:Yes  Duration of Psychotic Symptoms:Greater than six months  Hallucinations:No data recorded Ideas of Reference:Percusatory; Paranoia  Suicidal Thoughts:No data recorded Homicidal Thoughts:No data recorded  Sensorium  Memory: Immediate Fair; Recent Fair; Remote Fair  Judgment: Impaired  Insight: Poor; Lacking   Executive Functions  Concentration: Poor  Attention Span: Poor  Recall: Poor  Fund of Knowledge: Poor  Language: Poor   Psychomotor Activity  Psychomotor Activity:No  data recorded  Assets  Assets: Physical Health; Resilience; Housing; Social Support   Sleep  Sleep:No data recorded   Physical Exam: Physical Exam Vitals and nursing note reviewed.  Constitutional:      Appearance: Normal appearance.  HENT:     Head: Normocephalic and atraumatic.     Mouth/Throat:     Pharynx: Oropharynx is clear.  Eyes:     Pupils: Pupils are equal, round, and reactive to light.  Cardiovascular:     Rate and Rhythm: Normal rate and regular rhythm.  Pulmonary:     Effort: Pulmonary effort is normal.     Breath sounds: Normal breath sounds.  Abdominal:     General: Abdomen is flat.     Palpations: Abdomen is soft.  Musculoskeletal:        General: Normal range of motion.  Skin:    General: Skin is warm and dry.  Neurological:     General: No focal deficit present.     Mental Status: She is alert. Mental status is at baseline.  Psychiatric:        Mood and Affect: Mood normal.        Thought Content: Thought content normal.    Review of Systems  Constitutional: Negative.   HENT: Negative.    Eyes: Negative.   Respiratory: Negative.    Cardiovascular: Negative.   Gastrointestinal:  Negative.   Musculoskeletal: Negative.   Skin: Negative.   Neurological: Negative.   Psychiatric/Behavioral: Negative.     Blood pressure 121/66, pulse (!) 58, temperature 98 F (36.7 C), temperature source Oral, resp. rate 18, height 5\' 4"  (1.626 m), weight 40.4 kg, SpO2 100 %. Body mass index is 15.28 kg/m.   Treatment Plan Summary: Medication management and Plan no change to medication management.  Team is documented previously is trying to work on some sort of arrangement to get her discharged or get her family to take her back.  Alethia Berthold, MD 07/29/2022, 2:36 PM

## 2022-07-29 NOTE — [facility] Group Notes (Signed)
[place] Group Notes:  (Nursing/MHT/Case Management/Adjunct)  Date:  07/29/2022  Time:  9:45 PM  Type of Therapy:   Wrap up  Participation Level:  Active  Participation Quality:  Appropriate  Affect:  Appropriate  Cognitive:  Alert  Insight:  Good  Engagement in Group:  Engaged and her goal is to go live with grandmother  Modes of Intervention:  Support  Summary of Progress/Problems:  [name] 07/29/2022, 9:45 PM

## 2022-07-29 NOTE — Plan of Care (Signed)
Patient rated her depression and anxiety 0/10. Denies SI,HI and AVH. Verbalized no issues. Appetite and energy level good. Compliant with medications. Support and encouragement given.

## 2022-07-29 NOTE — BH IP Treatment Plan (Signed)
Interdisciplinary Treatment and Diagnostic Plan Update  07/29/2022 Time of Session: 08:40 [name] MRN: [id number]  Principal Diagnosis: Schizoaffective disorder, depressive type (HCC)  Secondary Diagnoses: Principal Problem:   Schizoaffective disorder, depressive type (HCC)   Current Medications:  Current Facility-Administered Medications  Medication Dose Route Frequency Provider Last Rate Last Admin   acetaminophen (TYLENOL) tablet 650 mg  650 mg Oral Q6H PRN [name], [name], MD       alum & mag hydroxide-simeth (MAALOX/MYLANTA) 200-200-20 MG/5ML suspension 30 mL  30 mL Oral Q4H PRN [name], [name], MD       haloperidol (HALDOL) tablet 5 mg  5 mg Oral BID [name], [name], MD   5 mg at 07/29/22 7322   Or   haloperidol lactate (HALDOL) injection 5 mg  5 mg Intramuscular BID [name], [name], MD       lithium carbonate capsule 600 mg  600 mg Oral QHS [name], [name], MD   600 mg at 07/28/22 2116   LORazepam (ATIVAN) tablet 1 mg  1 mg Oral BID [name], [name], MD   1 mg at 07/29/22 0813   Or   LORazepam (ATIVAN) injection 1 mg  1 mg Intramuscular BID [name], [name], MD       magnesium hydroxide (MILK OF MAGNESIA) suspension 30 mL  30 mL Oral Daily PRN [name], [name], MD       mirtazapine (REMERON) tablet 30 mg  30 mg Oral QHS [name], [name], MD   30 mg at 07/28/22 2116   multivitamin with minerals tablet 1 tablet  1 tablet Oral Daily [name], [name], MD   1 tablet at 07/29/22 0813   QUEtiapine (SEROQUEL) tablet 400 mg  400 mg Oral QHS [name], [name], MD   400 mg at 07/28/22 2116   thiamine (VITAMIN B1) tablet 100 mg  100 mg Oral Daily [name], [name], MD   100 mg at 07/29/22 0813   PTA Medications: Medications Prior to Admission  Medication Sig Dispense Refill Last Dose   clonazePAM (KLONOPIN) 0.5 MG tablet Take 0.5 tablets (0.25 mg total) by mouth 2 (two) times daily. (Patient not taking: Reported on 03/30/2022) 60 tablet 0    haloperidol (HALDOL) 5 MG tablet Take 1 tablet (5 mg  total) by mouth 2 (two) times daily. (Patient not taking: Reported on 03/30/2022) 60 tablet 3    midodrine (PROAMATINE) 2.5 MG tablet Take 1 tablet (2.5 mg total) by mouth 3 (three) times daily with meals. (Patient not taking: Reported on 03/30/2022)  0    mirtazapine (REMERON) 15 MG tablet Take 0.5 tablets (7.5 mg total) by mouth at bedtime. (Patient not taking: Reported on 03/30/2022) 30 tablet 2    Multiple Vitamin (MULTIVITAMIN WITH MINERALS) TABS tablet Take 1 tablet by mouth daily. (Patient not taking: Reported on 03/30/2022)      OLANZapine (ZYPREXA) 5 MG tablet Take 1 tablet (5 mg total) by mouth at bedtime. (Patient not taking: Reported on 03/30/2022) 30 tablet 2    thiamine 100 MG tablet Take 1 tablet (100 mg total) by mouth daily. (Patient not taking: Reported on 03/30/2022)       Patient Stressors: Marital or family conflict    Patient Strengths: Supportive family/friends   Treatment Modalities: Medication Management, Group therapy, Case management,  1 to 1 session with clinician, Psychoeducation, Recreational therapy.   Physician Treatment Plan for Primary Diagnosis: Schizoaffective disorder, depressive type (HCC) Long Term Goal(s): Improvement in symptoms so as ready for  discharge   Short Term Goals: Compliance with prescribed medications will improve Ability to verbalize feelings will improve Ability to demonstrate self-control will improve Ability to identify and develop effective coping behaviors will improve  Medication Management: Evaluate patient's response, side effects, and tolerance of medication regimen.  Therapeutic Interventions: 1 to 1 sessions, Unit Group sessions and Medication administration.  Evaluation of Outcomes: Progressing  Physician Treatment Plan for Secondary Diagnosis: Principal Problem:   Schizoaffective disorder, depressive type ([place])  Long Term Goal(s): Improvement in symptoms so as ready for discharge   Short Term Goals: Compliance with  prescribed medications will improve Ability to verbalize feelings will improve Ability to demonstrate self-control will improve Ability to identify and develop effective coping behaviors will improve     Medication Management: Evaluate patient's response, side effects, and tolerance of medication regimen.  Therapeutic Interventions: 1 to 1 sessions, Unit Group sessions and Medication administration.  Evaluation of Outcomes: Progressing   RN Treatment Plan for Primary Diagnosis: Schizoaffective disorder, depressive type ([place]) Long Term Goal(s): Knowledge of disease and therapeutic regimen to maintain health will improve  Short Term Goals: Ability to verbalize frustration and anger appropriately will improve, Ability to demonstrate self-control, Ability to participate in decision making will improve, Ability to verbalize feelings will improve, Ability to disclose and discuss suicidal ideas, Ability to identify and develop effective coping behaviors will improve, and Compliance with prescribed medications will improve  Medication Management: RN will administer medications as ordered by provider, will assess and evaluate patient's response and provide education to patient for prescribed medication. RN will report any adverse and/or side effects to prescribing provider.  Therapeutic Interventions: 1 on 1 counseling sessions, Psychoeducation, Medication administration, Evaluate responses to treatment, Monitor vital signs and CBGs as ordered, Perform/monitor CIWA, COWS, AIMS and Fall Risk screenings as ordered, Perform wound care treatments as ordered.  Evaluation of Outcomes: Progressing   LCSW Treatment Plan for Primary Diagnosis: Schizoaffective disorder, depressive type ([place]) Long Term Goal(s): Safe transition to appropriate next level of care at discharge, Engage patient in therapeutic group addressing interpersonal concerns.  Short Term Goals: Engage patient in aftercare planning with  referrals and resources, Increase social support, Increase ability to appropriately verbalize feelings, Increase emotional regulation, Facilitate acceptance of mental health diagnosis and concerns, and Increase skills for wellness and recovery  Therapeutic Interventions: Assess for all discharge needs, 1 to 1 time with Social worker, Explore available resources and support systems, Assess for adequacy in community support network, Educate family and significant other(s) on suicide prevention, Complete Psychosocial Assessment, Interpersonal group therapy.  Evaluation of Outcomes: Progressing   Progress in Treatment: Attending groups: No. Participating in groups: No. Taking medication as prescribed: Yes. Toleration medication: Yes. Family/Significant other contact made: Yes, individual(s) contacted:  SPE completed with patient's guardian. Patient understands diagnosis: No. Discussing patient identified problems/goals with staff: Yes. Medical problems stabilized or resolved: Yes. Denies suicidal/homicidal ideation: Yes. Issues/concerns per patient self-inventory: No. Other: none.  New problem(s) identified: No, Describe:  none  Update 06/29/2022: No changes at this time. Update 07/04/2022: No changes at this time. Update 07/09/22: No changes at this time. Update 07/14/22: No changes at this time.  Update 07/19/2022:   No changes at this time. Update 07/24/2022:  No changes at this time. Update 07/29/22: No changes at this time.    New Short Term/Long Term Goal(s): Patient to work towards elimination of symptoms of psychosis, medication management for mood stabilization; elimination of SI thoughts; development of comprehensive mental wellness plan.  Update 06/29/2022: No changes at this time. Update 07/04/2022: No changes at this time. Update 07/09/22: No changes at this time. Update 07/14/22: No changes at this time.   Update 07/19/2022:   No changes at this time.  Update 07/24/2022:  No changes at  this time. Update 07/29/22: No changes at this time.   Patient Goals:  No additional goals identified at this time. Patient to continue to work towards original goals identified in initial treatment team meeting. CSW will remain available to patient should they voice additional treatment goals. Update 06/29/2022: No changes at this time. Update 07/04/2022: No changes at this time. Update 07/09/22: No changes at this time. Update 07/14/22: No changes at this time.   Update 07/19/2022:   No changes at this time. Update 07/24/2022:  No changes at this time. Update 07/29/22: No changes at this time.   Discharge Plan or Barriers: Patient lacks adequate housing/supervision at discharge. Patient's guardian refuses to pick patient up from hospital out of safety concerns. CSW team continuing to work with legal guardian to attempt to resolve concerns and identify adequate discharge plan. Update 06/29/2022: Guardian is seeking special assistance medicaid.  She has not completed the paperwork at last update.  Patient remains on the unit and safe, however, no plans have been developed for placement at discharge.  Patient lacks funding for group home, and can not afford out of pocket costs associated with Assisted Living. Update 07/04/2022: Currently seeking ALF/Group Home placement. [facility] reviewing for potential placement. Update 07/09/22: No changes at this time. Update 07/14/22: No changes at this time.   Update 07/19/2022:   No changes at this time. Update 07/24/2022:  Legal guardian continues to decline that patient can return home.  Guardian is now open to the idea of the patient going to a group home, if the home allows for the equipment to monitor the patient. Update 07/29/22: No changes at this time.   Reason for Continuation of Hospitalization: Other; describe Patient is psychiatrically clear. Patient is currently boarding.    Estimated Length of Stay: TBD Update 06/29/2022: TBD Update 07/04/2022: No changes at  this time. Update 07/09/22: No changes at this time. Update 07/14/22: No changes at this time.   Update 07/19/2022:   No changes at this time. Update 07/24/2022:  TBD Update 07/29/22: TBD.  Last 3 [place] Suicide Severity Risk Score: Flowsheet Row Admission (Current) from 04/08/2022 in [place] Admission (Discharged) from 10/17/2021 in [facility] Admission (Discharged) from 10/02/2021 in [facility]U  C-SSRS RISK CATEGORY No Risk No Risk No Risk       Last PHQ 2/9 Scores:     No data to display          Scribe for Treatment Team: Shirl Harris, LCSW 07/29/2022 9:11 AM

## 2022-07-29 NOTE — Group Note (Signed)
[facility] LCSW Group Therapy Note   Group Date: 07/29/2022 Start Time: 1300 End Time: 1400   Type of Therapy/Topic:  Group Therapy:  Emotion Regulation  Participation Level:  Did Not Attend    Description of Group:    The purpose of this group is to assist patients in learning to regulate negative emotions and experience positive emotions. Patients will be guided to discuss ways in which they have been vulnerable to their negative emotions. These vulnerabilities will be juxtaposed with experiences of positive emotions or situations, and patients challenged to use positive emotions to combat negative ones. Special emphasis will be placed on coping with negative emotions in conflict situations, and patients will process healthy conflict resolution skills.  Therapeutic Goals: Patient will identify two positive emotions or experiences to reflect on in order to balance out negative emotions:  Patient will label two or more emotions that they find the most difficult to experience:  Patient will be able to demonstrate positive conflict resolution skills through discussion or role plays:   Summary of Patient Progress: X   Therapeutic Modalities:   Cognitive Behavioral Therapy Feelings Identification Dialectical Behavioral Therapy   Marwah Disbro R Sadie Hazelett, LCSW 

## 2022-07-30 DIAGNOSIS — F251 Schizoaffective disorder, depressive type: Secondary | ICD-10-CM | POA: Diagnosis not present

## 2022-07-30 NOTE — [facility] Group Notes (Signed)
[place] Group Notes:  (Nursing/MHT/Case Management/Adjunct)  Date:  07/30/2022  Time:  3:05 PM  Type of Therapy:  Psychoeducational Skills  Participation Level:  Did Not Attend   Adela Lank [facility] 07/30/2022, 3:05 PM

## 2022-07-30 NOTE — Progress Notes (Signed)
[facility] MD Progress Note  07/30/2022 12:16 PM [name]  MRN:  [id number] Subjective: Patient with chronic psychotic disorder.  No change to presentation.  Still paranoid but not acting out.  Takes care of herself adequately.  Denies suicidal thoughts Principal Problem: Schizoaffective disorder, depressive type (HCC) Diagnosis: Principal Problem:   Schizoaffective disorder, depressive type (HCC)  Total Time spent with patient: 30 minutes  Past Psychiatric History: Past history of psychotic disorder  Past Medical History:  Past Medical History:  Diagnosis Date   Anemia 10/02/2021   Non compliance w medication regimen    Schizophrenia [facility])     Past Surgical History:  Procedure Laterality Date   BIOPSY  09/25/2021   Procedure: BIOPSY;  Surgeon: Rachael Fee, MD;  Location: [facility];  Service: Gastroenterology;;   ESOPHAGOGASTRODUODENOSCOPY (EGD) WITH PROPOFOL [name] 09/25/2021   Procedure: ESOPHAGOGASTRODUODENOSCOPY (EGD) WITH PROPOFOL;  Surgeon: Rachael Fee, MD;  Location: [facility];  Service: Gastroenterology;  Laterality: [name];  With NGT placement   Family History: History reviewed. No pertinent family history. Family Psychiatric  History: See previous Social History:  Social History   Substance and Sexual Activity  Alcohol Use Not Currently   Comment: Refuses to disclose how much     Social History   Substance and Sexual Activity  Drug Use No   Comment: Refuses to answer    Social History   Socioeconomic History   Marital status: Single    Spouse name: Not on file   Number of children: Not on file   Years of education: Not on file   Highest education level: Not on file  Occupational History   Not on file  Tobacco Use   Smoking status: Every Day    Packs/day: 2.00    Types: Cigarettes   Smokeless tobacco: Never  Substance and Sexual Activity   Alcohol use: Not Currently    Comment: Refuses to disclose how much   Drug use: No    Comment: Refuses to  answer   Sexual activity: Not Currently    Comment: refused to answer  Other Topics Concern   Not on file  Social History Narrative   Not on file   Social Determinants of Health   Financial Resource Strain: Not on file  Food Insecurity: No Food Insecurity (04/08/2022)   Hunger Vital Sign    Worried About Running Out of Food in the Last Year: Never true    Ran Out of Food in the Last Year: Never true  Transportation Needs: No Transportation Needs (04/08/2022)   PRAPARE - Administrator, Civil Service (Medical): No    Lack of Transportation (Non-Medical): No  Physical Activity: Not on file  Stress: Not on file  Social Connections: Not on file   Additional Social History:                         Sleep: Fair  Appetite:  Fair  Current Medications: Current Facility-Administered Medications  Medication Dose Route Frequency Provider Last Rate Last Admin   acetaminophen (TYLENOL) tablet 650 mg  650 mg Oral Q6H PRN Auriana Scalia T, MD       alum & mag hydroxide-simeth (MAALOX/MYLANTA) 200-200-20 MG/5ML suspension 30 mL  30 mL Oral Q4H PRN Makyra Corprew T, MD       haloperidol (HALDOL) tablet 5 mg  5 mg Oral BID Izek Corvino, [name], MD   5 mg at 07/30/22 0815   Or  haloperidol lactate (HALDOL) injection 5 mg  5 mg Intramuscular BID Caileigh Canche T, MD       lithium carbonate capsule 600 mg  600 mg Oral QHS Lirio Bach T, MD   600 mg at 07/29/22 2105   LORazepam (ATIVAN) tablet 1 mg  1 mg Oral BID Camauri Craton T, MD   1 mg at 07/30/22 0815   Or   LORazepam (ATIVAN) injection 1 mg  1 mg Intramuscular BID Farzana Koci T, MD       magnesium hydroxide (MILK OF MAGNESIA) suspension 30 mL  30 mL Oral Daily PRN Timiko Offutt T, MD       mirtazapine (REMERON) tablet 30 mg  30 mg Oral QHS Draven Natter T, MD   30 mg at 07/29/22 2105   multivitamin with minerals tablet 1 tablet  1 tablet Oral Daily Latash Nouri, [name], MD   1 tablet at 07/30/22 0815   QUEtiapine (SEROQUEL)  tablet 400 mg  400 mg Oral QHS Rustin Erhart T, MD   400 mg at 07/29/22 2105   thiamine (VITAMIN B1) tablet 100 mg  100 mg Oral Daily Quantavius Humm, [name], MD   100 mg at 07/30/22 0815    Lab Results: No results found for this or any previous visit (from the past 49 hour(s)).  Blood Alcohol level:  Lab Results  Component Value Date   ETH <10 03/29/2022   ETH <10 10/96/0454    Metabolic Disorder Labs: Lab Results  Component Value Date   HGBA1C 5.6 10/21/2021   MPG 114 10/21/2021   MPG 111 10/02/2021   Lab Results  Component Value Date   PROLACTIN 13.6 04/16/2015   Lab Results  Component Value Date   CHOL 189 10/21/2021   TRIG 52 10/21/2021   HDL 50 10/21/2021   CHOLHDL 3.8 10/21/2021   VLDL 10 10/21/2021   LDLCALC 129 (H) 10/21/2021   LDLCALC 93 10/01/2020    Physical Findings: AIMS: Facial and Oral Movements Muscles of Facial Expression: None, normal Lips and Perioral Area: None, normal Jaw: None, normal Tongue: None, normal,Extremity Movements Upper (arms, wrists, hands, fingers): None, normal Lower (legs, knees, ankles, toes): None, normal, Trunk Movements Neck, shoulders, hips: None, normal, Overall Severity Severity of abnormal movements (highest score from questions above): None, normal Incapacitation due to abnormal movements: None, normal Patient's awareness of abnormal movements (rate only patient's report): No Awareness, Dental Status Current problems with teeth and/or dentures?: No Does patient usually wear dentures?: No  CIWA:    COWS:     Musculoskeletal: Strength & Muscle Tone: within normal limits Gait & Station: normal Patient leans: [name]  Psychiatric Specialty Exam:  Presentation  General Appearance:  Disheveled  Eye Contact: Fair  Speech: Pressured  Speech Volume: Increased  Handedness: Right   Mood and Affect  Mood: Irritable; Labile; Angry  Affect: Blunt; Labile   Thought Process  Thought Processes: Disorganized;  Irrevelant  Descriptions of Associations:Loose  Orientation:Partial  Thought Content:Illogical; Rumination; Scattered; Tangential; Delusions  History of Schizophrenia/Schizoaffective disorder:Yes  Duration of Psychotic Symptoms:Greater than six months  Hallucinations:No data recorded Ideas of Reference:Percusatory; Paranoia  Suicidal Thoughts:No data recorded Homicidal Thoughts:No data recorded  Sensorium  Memory: Immediate Fair; Recent Fair; Remote Fair  Judgment: Impaired  Insight: Poor; Lacking   Executive Functions  Concentration: Poor  Attention Span: Poor  Recall: Poor  Fund of Knowledge: Poor  Language: Poor   Psychomotor Activity  Psychomotor Activity:No data recorded  Assets  Assets: Physical Health; Resilience; Housing;  Social Support   Sleep  Sleep:No data recorded   Physical Exam: Physical Exam Vitals and nursing note reviewed.  Constitutional:      Appearance: Normal appearance.  HENT:     Head: Normocephalic and atraumatic.     Mouth/Throat:     Pharynx: Oropharynx is clear.  Eyes:     Pupils: Pupils are equal, round, and reactive to light.  Cardiovascular:     Rate and Rhythm: Normal rate and regular rhythm.  Pulmonary:     Effort: Pulmonary effort is normal.     Breath sounds: Normal breath sounds.  Abdominal:     General: Abdomen is flat.     Palpations: Abdomen is soft.  Musculoskeletal:        General: Normal range of motion.  Skin:    General: Skin is warm and dry.  Neurological:     General: No focal deficit present.     Mental Status: She is alert. Mental status is at baseline.  Psychiatric:        Attention and Perception: She is inattentive.        Mood and Affect: Mood normal. Affect is blunt.        Speech: She is noncommunicative.    Review of Systems  Constitutional: Negative.   HENT: Negative.    Eyes: Negative.   Respiratory: Negative.    Cardiovascular: Negative.   Gastrointestinal:  Negative.   Musculoskeletal: Negative.   Skin: Negative.   Neurological: Negative.   Psychiatric/Behavioral: Negative.     Blood pressure 106/68, pulse (!) 57, temperature 97.8 F (36.6 C), temperature source Oral, resp. rate 18, height 5\' 4"  (1.626 m), weight 40.4 kg, SpO2 100 %. Body mass index is 15.28 kg/m.   Treatment Plan Summary: Medication management and Plan no change to medication management.  Treatment team still working on possible discharge options  Alethia Berthold, MD 07/30/2022, 12:16 PM

## 2022-07-30 NOTE — Progress Notes (Signed)
Patient is quiet and reserved. Stays out longer this evening hanging out with her peers. Participated in group this evening and watched as her peers played cards afterward. She is med compliant. She denies si/hi/avh. Also denies depression and anxiety. Will continue to monitor her with 15 minute safety checks.     C Butler-Nicholson, LPN

## 2022-07-30 NOTE — Progress Notes (Signed)
Recreation Therapy Notes    Date: 07/30/2022  Time: 10:50 am  Location: Craft room     Behavioral response: [name]   Intervention Topic: Self-care   Discussion/Intervention: Patient refused to attend group.   Clinical Observations/Feedback:  Patient refused to attend group.    Isabell Bonafede LRT/CTRS         Aizlynn Digilio 07/30/2022 12:35 PM 

## 2022-07-30 NOTE — Plan of Care (Signed)
D- Patient alert and oriented. Patient presented in a pleasant mood on assessment reporting that she slept well last night and had no complaints to voice to this Probation officer. Patient denied SI, HI, AVH, and pain at this time. Patient also denied and signs/symptoms of depression and anxiety, stating "no ma'am". Patient's reported goal for today, per her self-inventory, is once again "discharge".   A- Scheduled medications administered to patient, per MD orders. Support and encouragement provided.  Routine safety checks conducted every 15 minutes.  Patient informed to notify staff with problems or concerns.  R- No adverse drug reactions noted. Patient contracts for safety at this time. Patient compliant with medications. Patient receptive, calm, and cooperative. Patient remains safe at this time.  Problem: Education: Goal: Knowledge of General Education information will improve Description: Including pain rating scale, medication(s)/side effects and non-pharmacologic comfort measures Outcome: Progressing   Problem: Health Behavior/Discharge Planning: Goal: Ability to manage health-related needs will improve Outcome: Progressing   Problem: Clinical Measurements: Goal: Ability to maintain clinical measurements within normal limits will improve Outcome: Progressing Goal: Will remain free from infection Outcome: Progressing Goal: Diagnostic test results will improve Outcome: Progressing Goal: Respiratory complications will improve Outcome: Progressing Goal: Cardiovascular complication will be avoided Outcome: Progressing   Problem: Activity: Goal: Risk for activity intolerance will decrease Outcome: Progressing   Problem: Nutrition: Goal: Adequate nutrition will be maintained Outcome: Progressing   Problem: Coping: Goal: Level of anxiety will decrease Outcome: Progressing   Problem: Elimination: Goal: Will not experience complications related to bowel motility Outcome:  Progressing Goal: Will not experience complications related to urinary retention Outcome: Progressing   Problem: Pain Managment: Goal: General experience of comfort will improve Outcome: Progressing   Problem: Safety: Goal: Ability to remain free from injury will improve Outcome: Progressing   Problem: Skin Integrity: Goal: Risk for impaired skin integrity will decrease Outcome: Progressing   Problem: Activity: Goal: Will verbalize the importance of balancing activity with adequate rest periods Outcome: Progressing   Problem: Education: Goal: Will be free of psychotic symptoms Outcome: Progressing Goal: Knowledge of the prescribed therapeutic regimen will improve Outcome: Progressing   Problem: Coping: Goal: Coping ability will improve Outcome: Progressing Goal: Will verbalize feelings Outcome: Progressing   Problem: Health Behavior/Discharge Planning: Goal: Compliance with prescribed medication regimen will improve Outcome: Progressing   Problem: Nutritional: Goal: Ability to achieve adequate nutritional intake will improve Outcome: Progressing   Problem: Role Relationship: Goal: Ability to communicate needs accurately will improve Outcome: Progressing Goal: Ability to interact with others will improve Outcome: Progressing   Problem: Safety: Goal: Ability to redirect hostility and anger into socially appropriate behaviors will improve Outcome: Progressing Goal: Ability to remain free from injury will improve Outcome: Progressing   Problem: Self-Care: Goal: Ability to participate in self-care as condition permits will improve Outcome: Progressing   Problem: Self-Concept: Goal: Will verbalize positive feelings about self Outcome: Progressing   Problem: Education: Goal: Knowledge of [facility] Education information/materials will improve Outcome: Progressing Goal: Emotional status will improve Outcome: Progressing Goal: Mental status will  improve Outcome: Progressing Goal: Verbalization of understanding the information provided will improve Outcome: Progressing   Problem: Activity: Goal: Interest or engagement in activities will improve Outcome: Progressing Goal: Sleeping patterns will improve Outcome: Progressing   Problem: Coping: Goal: Ability to verbalize frustrations and anger appropriately will improve Outcome: Progressing Goal: Ability to demonstrate self-control will improve Outcome: Progressing   Problem: Health Behavior/Discharge Planning: Goal: Identification of resources available to assist in meeting  health care needs will improve Outcome: Progressing Goal: Compliance with treatment plan for underlying cause of condition will improve Outcome: Progressing   Problem: Physical Regulation: Goal: Ability to maintain clinical measurements within normal limits will improve Outcome: Progressing   Problem: Safety: Goal: Periods of time without injury will increase Outcome: Progressing

## 2022-07-30 NOTE — Group Note (Signed)
LCSW Group Therapy Note  Group Date: 07/30/2022 Start Time: 1300 End Time: 1400   Type of Therapy and Topic:  Group Therapy - Healthy vs Unhealthy Coping Skills  Participation Level:  Did Not Attend   Description of Group The focus of this group was to determine what unhealthy coping techniques typically are used by group members and what healthy coping techniques would be helpful in coping with various problems. Patients were guided in becoming aware of the differences between healthy and unhealthy coping techniques. Patients were asked to identify 2-3 healthy coping skills they would like to learn to use more effectively.  Therapeutic Goals Patients learned that coping is what human beings do all day long to deal with various situations in their lives Patients defined and discussed healthy vs unhealthy coping techniques Patients identified their preferred coping techniques and identified whether these were healthy or unhealthy Patients determined 2-3 healthy coping skills they would like to become more familiar with and use more often. Patients provided support and ideas to each other   Summary of Patient Progress:   Patient did not attend group despite encouraged participation.     Therapeutic Modalities Cognitive Behavioral Therapy Motivational Interviewing  Larose Kells 07/30/2022  2:19 PM

## 2022-07-30 NOTE — Plan of Care (Signed)
  Problem: Education: Goal: Knowledge of General Education information will improve Description: Including pain rating scale, medication(s)/side effects and non-pharmacologic comfort measures Outcome: Progressing   Problem: Health Behavior/Discharge Planning: Goal: Ability to manage health-related needs will improve Outcome: Progressing   Problem: Clinical Measurements: Goal: Ability to maintain clinical measurements within normal limits will improve Outcome: Progressing Goal: Will remain free from infection Outcome: Progressing Goal: Diagnostic test results will improve Outcome: Progressing Goal: Respiratory complications will improve Outcome: Progressing Goal: Cardiovascular complication will be avoided Outcome: Progressing   Problem: Physical Regulation: Goal: Ability to maintain clinical measurements within normal limits will improve Outcome: Progressing   Problem: Health Behavior/Discharge Planning: Goal: Identification of resources available to assist in meeting health care needs will improve Outcome: Progressing Goal: Compliance with treatment plan for underlying cause of condition will improve Outcome: Progressing

## 2022-07-30 NOTE — [facility] Counselor (Signed)
CSW sat with pt while meeting with [place] [employer]) and Loann Quill (owner). She initially stated that she did not need anywhere to stay because she has her own home. Gerald Stabs asked her to think of it as a physical assessment. Pt was assessed regarding need for physical assistance and chronic health concerns (high blood pressure and diabetes). Assessment completed without incident. No other concerns expressed.  Tressa Busman did voice concerns as pt does not want to go anywhere other than her home as this could potentially lead to her trying to elope. At any rate, [place] will be following up with CSW team regarding decision.   Chalmers Guest. Guerry Bruin, MSW, LCSW, [place] 07/30/2022 4:12 PM

## 2022-07-31 DIAGNOSIS — F251 Schizoaffective disorder, depressive type: Secondary | ICD-10-CM | POA: Diagnosis not present

## 2022-07-31 NOTE — Plan of Care (Signed)
  Problem: Coping: Goal: Level of anxiety will decrease Outcome: Progressing   Problem: Safety: Goal: Ability to remain free from injury will improve Outcome: Progressing   Problem: Coping: Goal: Coping ability will improve Outcome: Progressing Goal: Will verbalize feelings Outcome: Progressing

## 2022-07-31 NOTE — Progress Notes (Signed)
Recreation Therapy Notes    Date: 07/31/2022  Time: 9:50 am  Location: Craft room     Behavioral response: [name]   Intervention Topic: Problem Solving  Discussion/Intervention: Patient refused to attend group.   Clinical Observations/Feedback:  Patient refused to attend group.    [name] LRT/CTRS        [name] 07/31/2022 12:39 PM 

## 2022-07-31 NOTE — Group Note (Signed)
[facility] LCSW Group Therapy Note   Group Date: 07/31/2022 Start Time: 1315 End Time: 1415   Type of Therapy/Topic:  Group Therapy:  Emotion Regulation  Participation Level:  Did Not Attend   Mood:  Description of Group:    The purpose of this group is to assist patients in learning to regulate negative emotions and experience positive emotions. Patients will be guided to discuss ways in which they have been vulnerable to their negative emotions. These vulnerabilities will be juxtaposed with experiences of positive emotions or situations, and patients challenged to use positive emotions to combat negative ones. Special emphasis will be placed on coping with negative emotions in conflict situations, and patients will process healthy conflict resolution skills.  Therapeutic Goals: Patient will identify two positive emotions or experiences to reflect on in order to balance out negative emotions:  Patient will label two or more emotions that they find the most difficult to experience:  Patient will be able to demonstrate positive conflict resolution skills through discussion or role plays:   Summary of Patient Progress:   Patient declined to attend group, despite encouragement from this CSW.    Therapeutic Modalities:   Cognitive Behavioral Therapy Feelings Identification Dialectical Behavioral Therapy   Marek Nghiem J Cameran Ahmed, LCSW 

## 2022-07-31 NOTE — Progress Notes (Signed)
Patient pleasant and cooperative. Denies SI, HI, AVH. Unremarkable evening, isolates to self and room. Comes out for snack and medication. No interaction with peers. Minimal with staff. Encouragement and support provided. Safety checks  maintained. Slept well this shift. Pt remains safe on unit with q 15 min checks.

## 2022-07-31 NOTE — Progress Notes (Signed)
[facility] MD Progress Note  07/31/2022 3:13 PM [name]  MRN:  [id number] Subjective: Follow-up for 62-year-old woman with schizoaffective disorder.  No change to anything about her presentation. Principal Problem: Schizoaffective disorder, depressive type ([place]) Diagnosis: Principal Problem:   Schizoaffective disorder, depressive type ([place])  Total Time spent with patient: 20 minutes  Past Psychiatric History: Past history of schizoaffective disorder  Past Medical History:  Past Medical History:  Diagnosis Date   Anemia 10/02/2021   Non compliance w medication regimen    Schizophrenia [facility])     Past Surgical History:  Procedure Laterality Date   BIOPSY  09/25/2021   Procedure: BIOPSY;  Surgeon: Milus Banister, MD;  Location: [facility];  Service: Gastroenterology;;   ESOPHAGOGASTRODUODENOSCOPY (EGD) WITH PROPOFOL [name] 09/25/2021   Procedure: ESOPHAGOGASTRODUODENOSCOPY (EGD) WITH PROPOFOL;  Surgeon: Milus Banister, MD;  Location: [facility];  Service: Gastroenterology;  Laterality: [name];  With NGT placement   Family History: History reviewed. No pertinent family history. Family Psychiatric  History: See previous Social History:  Social History   Substance and Sexual Activity  Alcohol Use Not Currently   Comment: Refuses to disclose how much     Social History   Substance and Sexual Activity  Drug Use No   Comment: Refuses to answer    Social History   Socioeconomic History   Marital status: Single    Spouse name: Not on file   Number of children: Not on file   Years of education: Not on file   Highest education level: Not on file  Occupational History   Not on file  Tobacco Use   Smoking status: Every Day    Packs/day: 2.00    Types: Cigarettes   Smokeless tobacco: Never  Substance and Sexual Activity   Alcohol use: Not Currently    Comment: Refuses to disclose how much   Drug use: No    Comment: Refuses to answer   Sexual activity: Not Currently    Comment:  refused to answer  Other Topics Concern   Not on file  Social History Narrative   Not on file   Social Determinants of Health   Financial Resource Strain: Not on file  Food Insecurity: No Food Insecurity (04/08/2022)   Hunger Vital Sign    Worried About Running Out of Food in the Last Year: Never true    Ran Out of Food in the Last Year: Never true  Transportation Needs: No Transportation Needs (04/08/2022)   PRAPARE - Hydrologist (Medical): No    Lack of Transportation (Non-Medical): No  Physical Activity: Not on file  Stress: Not on file  Social Connections: Not on file   Additional Social History:                         Sleep: Fair  Appetite:  Fair  Current Medications: Current Facility-Administered Medications  Medication Dose Route Frequency Provider Last Rate Last Admin   acetaminophen (TYLENOL) tablet 650 mg  650 mg Oral Q6H PRN Shanterica Biehler T, MD       alum & mag hydroxide-simeth (MAALOX/MYLANTA) 200-200-20 MG/5ML suspension 30 mL  30 mL Oral Q4H PRN Deshanae Lindo T, MD       haloperidol (HALDOL) tablet 5 mg  5 mg Oral BID Bret Vanessen, [name], MD   5 mg at 07/31/22 0810   Or   haloperidol lactate (HALDOL) injection 5 mg  5 mg Intramuscular BID Keslee Harrington,  [name], MD       lithium carbonate capsule 600 mg  600 mg Oral QHS Kanyia Heaslip T, MD   600 mg at 07/30/22 2108   LORazepam (ATIVAN) tablet 1 mg  1 mg Oral BID Alexianna Nachreiner T, MD   1 mg at 07/31/22 3329   Or   LORazepam (ATIVAN) injection 1 mg  1 mg Intramuscular BID Cesareo Vickrey T, MD       magnesium hydroxide (MILK OF MAGNESIA) suspension 30 mL  30 mL Oral Daily PRN Dantrell Schertzer T, MD       mirtazapine (REMERON) tablet 30 mg  30 mg Oral QHS Elisama Thissen T, MD   30 mg at 07/30/22 2108   multivitamin with minerals tablet 1 tablet  1 tablet Oral Daily Yvanna Vidas, [name], MD   1 tablet at 07/31/22 0810   QUEtiapine (SEROQUEL) tablet 400 mg  400 mg Oral QHS Kymari Lollis T, MD   400  mg at 07/30/22 2108   thiamine (VITAMIN B1) tablet 100 mg  100 mg Oral Daily Zakia Sainato, [name], MD   100 mg at 07/31/22 5188    Lab Results: No results found for this or any previous visit (from the past 48 hour(s)).  Blood Alcohol level:  Lab Results  Component Value Date   ETH <10 03/29/2022   ETH <10 09/12/2021    Metabolic Disorder Labs: Lab Results  Component Value Date   HGBA1C 5.6 10/21/2021   MPG 114 10/21/2021   MPG 111 10/02/2021   Lab Results  Component Value Date   PROLACTIN 13.6 04/16/2015   Lab Results  Component Value Date   CHOL 189 10/21/2021   TRIG 52 10/21/2021   HDL 50 10/21/2021   CHOLHDL 3.8 10/21/2021   VLDL 10 10/21/2021   LDLCALC 129 (H) 10/21/2021   LDLCALC 93 10/01/2020    Physical Findings: AIMS: Facial and Oral Movements Muscles of Facial Expression: None, normal Lips and Perioral Area: None, normal Jaw: None, normal Tongue: None, normal,Extremity Movements Upper (arms, wrists, hands, fingers): None, normal Lower (legs, knees, ankles, toes): None, normal, Trunk Movements Neck, shoulders, hips: None, normal, Overall Severity Severity of abnormal movements (highest score from questions above): None, normal Incapacitation due to abnormal movements: None, normal Patient's awareness of abnormal movements (rate only patient's report): No Awareness, Dental Status Current problems with teeth and/or dentures?: No Does patient usually wear dentures?: No  CIWA:    COWS:     Musculoskeletal: Strength & Muscle Tone: within normal limits Gait & Station: normal Patient leans: [name]  Psychiatric Specialty Exam:  Presentation  General Appearance:  Disheveled  Eye Contact: Fair  Speech: Pressured  Speech Volume: Increased  Handedness: Right   Mood and Affect  Mood: Irritable; Labile; Angry  Affect: Blunt; Labile   Thought Process  Thought Processes: Disorganized; Irrevelant  Descriptions of  Associations:Loose  Orientation:Partial  Thought Content:Illogical; Rumination; Scattered; Tangential; Delusions  History of Schizophrenia/Schizoaffective disorder:Yes  Duration of Psychotic Symptoms:Greater than six months  Hallucinations:No data recorded Ideas of Reference:Percusatory; Paranoia  Suicidal Thoughts:No data recorded Homicidal Thoughts:No data recorded  Sensorium  Memory: Immediate Fair; Recent Fair; Remote Fair  Judgment: Impaired  Insight: Poor; Lacking   Executive Functions  Concentration: Poor  Attention Span: Poor  Recall: Poor  Fund of Knowledge: Poor  Language: Poor   Psychomotor Activity  Psychomotor Activity:No data recorded  Assets  Assets: Physical Health; Resilience; Housing; Social Support   Sleep  Sleep:No data recorded   Physical  Exam: Physical Exam Vitals and nursing note reviewed.  Constitutional:      Appearance: Normal appearance.  HENT:     Head: Normocephalic and atraumatic.     Mouth/Throat:     Pharynx: Oropharynx is clear.  Eyes:     Pupils: Pupils are equal, round, and reactive to light.  Cardiovascular:     Rate and Rhythm: Normal rate and regular rhythm.  Pulmonary:     Effort: Pulmonary effort is normal.     Breath sounds: Normal breath sounds.  Abdominal:     General: Abdomen is flat.     Palpations: Abdomen is soft.  Musculoskeletal:        General: Normal range of motion.  Skin:    General: Skin is warm and dry.  Neurological:     General: No focal deficit present.     Mental Status: She is alert. Mental status is at baseline.  Psychiatric:        Attention and Perception: She is inattentive.        Mood and Affect: Mood normal. Affect is blunt.        Speech: She is noncommunicative.    Review of Systems  Unable to perform ROS: Psychiatric disorder  Constitutional: Negative.   HENT: Negative.    Eyes: Negative.   Respiratory: Negative.    Cardiovascular: Negative.    Gastrointestinal: Negative.   Musculoskeletal: Negative.   Skin: Negative.   Neurological: Negative.   Psychiatric/Behavioral: Negative.     Blood pressure 120/69, pulse 63, temperature 98.1 F (36.7 C), temperature source Oral, resp. rate 16, height 5\' 4"  (1.626 m), weight 40.4 kg, SpO2 100 %. Body mass index is 15.28 kg/m.   Treatment Plan Summary: Medication management and Plan no change to medication management.  Patient is still essentially waiting for appropriate placement to become available  Alethia Berthold, MD 07/31/2022, 3:13 PM

## 2022-07-31 NOTE — Plan of Care (Signed)
D- Patient alert and oriented. Patient presented in a pleasant mood on assessment reporting that she slept good last night and had no complaints to voice to this Probation officer. Patient denied SI, HI, AVH, and pain at this time. Patient also denied any signs/symptoms of depression and anxiety. Patient's stated goal for today, just like yesterday, is to discharge.  A- Scheduled medications administered to patient, per MD orders. Support and encouragement provided.  Routine safety checks conducted every 15 minutes.  Patient informed to notify staff with problems or concerns.  R- No adverse drug reactions noted. Patient contracts for safety at this time. Patient compliant with medications and treatment plan. Patient receptive, calm, and cooperative. Patient interacts well with others on the unit. Patient remains safe at this time.  Problem: Education: Goal: Knowledge of General Education information will improve Description: Including pain rating scale, medication(s)/side effects and non-pharmacologic comfort measures Outcome: Progressing   Problem: Health Behavior/Discharge Planning: Goal: Ability to manage health-related needs will improve Outcome: Progressing   Problem: Clinical Measurements: Goal: Ability to maintain clinical measurements within normal limits will improve Outcome: Progressing Goal: Will remain free from infection Outcome: Progressing Goal: Diagnostic test results will improve Outcome: Progressing Goal: Respiratory complications will improve Outcome: Progressing Goal: Cardiovascular complication will be avoided Outcome: Progressing   Problem: Activity: Goal: Risk for activity intolerance will decrease Outcome: Progressing   Problem: Nutrition: Goal: Adequate nutrition will be maintained Outcome: Progressing   Problem: Coping: Goal: Level of anxiety will decrease Outcome: Progressing   Problem: Elimination: Goal: Will not experience complications related to bowel  motility Outcome: Progressing Goal: Will not experience complications related to urinary retention Outcome: Progressing   Problem: Pain Managment: Goal: General experience of comfort will improve Outcome: Progressing   Problem: Safety: Goal: Ability to remain free from injury will improve Outcome: Progressing   Problem: Skin Integrity: Goal: Risk for impaired skin integrity will decrease Outcome: Progressing   Problem: Activity: Goal: Will verbalize the importance of balancing activity with adequate rest periods Outcome: Progressing   Problem: Education: Goal: Will be free of psychotic symptoms Outcome: Progressing Goal: Knowledge of the prescribed therapeutic regimen will improve Outcome: Progressing   Problem: Coping: Goal: Coping ability will improve Outcome: Progressing Goal: Will verbalize feelings Outcome: Progressing   Problem: Health Behavior/Discharge Planning: Goal: Compliance with prescribed medication regimen will improve Outcome: Progressing   Problem: Nutritional: Goal: Ability to achieve adequate nutritional intake will improve Outcome: Progressing   Problem: Role Relationship: Goal: Ability to communicate needs accurately will improve Outcome: Progressing Goal: Ability to interact with others will improve Outcome: Progressing   Problem: Safety: Goal: Ability to redirect hostility and anger into socially appropriate behaviors will improve Outcome: Progressing Goal: Ability to remain free from injury will improve Outcome: Progressing   Problem: Self-Care: Goal: Ability to participate in self-care as condition permits will improve Outcome: Progressing   Problem: Self-Concept: Goal: Will verbalize positive feelings about self Outcome: Progressing   Problem: Education: Goal: Knowledge of [place] [facility] Education information/materials will improve Outcome: Progressing Goal: Emotional status will improve Outcome: Progressing Goal: Mental  status will improve Outcome: Progressing Goal: Verbalization of understanding the information provided will improve Outcome: Progressing   Problem: Activity: Goal: Interest or engagement in activities will improve Outcome: Progressing Goal: Sleeping patterns will improve Outcome: Progressing   Problem: Coping: Goal: Ability to verbalize frustrations and anger appropriately will improve Outcome: Progressing Goal: Ability to demonstrate self-control will improve Outcome: Progressing   Problem: Health Behavior/Discharge Planning: Goal: Identification of  resources available to assist in meeting health care needs will improve Outcome: Progressing Goal: Compliance with treatment plan for underlying cause of condition will improve Outcome: Progressing   Problem: Physical Regulation: Goal: Ability to maintain clinical measurements within normal limits will improve Outcome: Progressing   Problem: Safety: Goal: Periods of time without injury will increase Outcome: Progressing

## 2022-08-01 DIAGNOSIS — F251 Schizoaffective disorder, depressive type: Secondary | ICD-10-CM | POA: Diagnosis not present

## 2022-08-01 NOTE — Progress Notes (Signed)
Pt is A&OX4, paces hallway periodically, denies suicidal ideations, denies homicidal ideations, denies auditory hallucinations and denies visual hallucinations. Pt verbally agrees to approach staff if these become apparent and before harming self or others. Pt denies experiencing nightmares. Mood and affect are congruent. Pt appetite is ok. No complaints of anxiety, distress, pain and/or discomfort at this time. Pt's memory appears to be grossly intact, and Pt hasn't displayed any injurious behaviors. Pt is medication compliant. There's no evidence of suicidal intent. Psychomotor activity was WNL. No s/s of Parkinson, Dystonia, Akathisia and/or Tardive Dyskinesia noted.

## 2022-08-01 NOTE — Progress Notes (Signed)
[facility] MD Progress Note  08/01/2022 12:01 PM [name]  MRN:  [id number] Subjective: [name] is seen on rounds and as usual she has no complaints and no issues.  She states that she is fine.  Nursing report no issues. Principal Problem: Schizoaffective disorder, depressive type ([place]) Diagnosis: Principal Problem:   Schizoaffective disorder, depressive type ([place])  Total Time spent with patient: 15 minutes  Past Psychiatric History: History of schizoaffective disorder  Past Medical History:  Past Medical History:  Diagnosis Date   Anemia 10/02/2021   Non compliance w medication regimen    Schizophrenia [facility])     Past Surgical History:  Procedure Laterality Date   BIOPSY  09/25/2021   Procedure: BIOPSY;  Surgeon: Milus Banister, MD;  Location: [facility];  Service: Gastroenterology;;   ESOPHAGOGASTRODUODENOSCOPY (EGD) WITH PROPOFOL [name] 09/25/2021   Procedure: ESOPHAGOGASTRODUODENOSCOPY (EGD) WITH PROPOFOL;  Surgeon: Milus Banister, MD;  Location: [facility];  Service: Gastroenterology;  Laterality: [name];  With NGT placement   Family History: History reviewed. No pertinent family history.  Social History:  Social History   Substance and Sexual Activity  Alcohol Use Not Currently   Comment: Refuses to disclose how much     Social History   Substance and Sexual Activity  Drug Use No   Comment: Refuses to answer    Social History   Socioeconomic History   Marital status: Single    Spouse name: Not on file   Number of children: Not on file   Years of education: Not on file   Highest education level: Not on file  Occupational History   Not on file  Tobacco Use   Smoking status: Every Day    Packs/day: 2.00    Types: Cigarettes   Smokeless tobacco: Never  Substance and Sexual Activity   Alcohol use: Not Currently    Comment: Refuses to disclose how much   Drug use: No    Comment: Refuses to answer   Sexual activity: Not Currently    Comment: refused to answer  Other  Topics Concern   Not on file  Social History Narrative   Not on file   Social Determinants of Health   Financial Resource Strain: Not on file  Food Insecurity: No Food Insecurity (04/08/2022)   Hunger Vital Sign    Worried About Running Out of Food in the Last Year: Never true    Ran Out of Food in the Last Year: Never true  Transportation Needs: No Transportation Needs (04/08/2022)   PRAPARE - Hydrologist (Medical): No    Lack of Transportation (Non-Medical): No  Physical Activity: Not on file  Stress: Not on file  Social Connections: Not on file   Additional Social History:                         Sleep: Good  Appetite:  Good  Current Medications: Current Facility-Administered Medications  Medication Dose Route Frequency Provider Last Rate Last Admin   acetaminophen (TYLENOL) tablet 650 mg  650 mg Oral Q6H PRN [name], [name], MD       alum & mag hydroxide-simeth (MAALOX/MYLANTA) 200-200-20 MG/5ML suspension 30 mL  30 mL Oral Q4H PRN [name], [name], MD       haloperidol (HALDOL) tablet 5 mg  5 mg Oral BID [name], [name], MD   5 mg at 08/01/22 0836   Or   haloperidol lactate (HALDOL) injection 5 mg  5 mg Intramuscular BID [name], [name], MD       lithium carbonate capsule 600 mg  600 mg Oral QHS [name], [name], MD   600 mg at 07/31/22 2115   LORazepam (ATIVAN) tablet 1 mg  1 mg Oral BID [name], [name], MD   1 mg at 08/01/22 7824   Or   LORazepam (ATIVAN) injection 1 mg  1 mg Intramuscular BID [name], [name], MD       magnesium hydroxide (MILK OF MAGNESIA) suspension 30 mL  30 mL Oral Daily PRN [name], [name], MD       mirtazapine (REMERON) tablet 30 mg  30 mg Oral QHS [name], [name], MD   30 mg at 07/31/22 2117   multivitamin with minerals tablet 1 tablet  1 tablet Oral Daily [name], [name], MD   1 tablet at 08/01/22 0836   QUEtiapine (SEROQUEL) tablet 400 mg  400 mg Oral QHS [name], [name], MD   400 mg at 07/31/22 2115    thiamine (VITAMIN B1) tablet 100 mg  100 mg Oral Daily [name], [name], MD   100 mg at 08/01/22 2353    Lab Results: No results found for this or any previous visit (from the past 48 hour(s)).  Blood Alcohol level:  Lab Results  Component Value Date   ETH <10 03/29/2022   ETH <10 09/12/2021    Metabolic Disorder Labs: Lab Results  Component Value Date   HGBA1C 5.6 10/21/2021   MPG 114 10/21/2021   MPG 111 10/02/2021   Lab Results  Component Value Date   PROLACTIN 13.6 04/16/2015   Lab Results  Component Value Date   CHOL 189 10/21/2021   TRIG 52 10/21/2021   HDL 50 10/21/2021   CHOLHDL 3.8 10/21/2021   VLDL 10 10/21/2021   LDLCALC 129 (H) 10/21/2021   LDLCALC 93 10/01/2020    Physical Findings: AIMS: Facial and Oral Movements Muscles of Facial Expression: None, normal Lips and Perioral Area: None, normal Jaw: None, normal Tongue: None, normal,Extremity Movements Upper (arms, wrists, hands, fingers): None, normal Lower (legs, knees, ankles, toes): None, normal, Trunk Movements Neck, shoulders, hips: None, normal, Overall Severity Severity of abnormal movements (highest score from questions above): None, normal Incapacitation due to abnormal movements: None, normal Patient's awareness of abnormal movements (rate only patient's report): No Awareness, Dental Status Current problems with teeth and/or dentures?: No Does patient usually wear dentures?: No  CIWA:    COWS:     Musculoskeletal: Strength & Muscle Tone: within normal limits Gait & Station: normal Patient leans: [name]  Psychiatric Specialty Exam:  Presentation  General Appearance:  Disheveled  Eye Contact: Fair  Speech: Pressured  Speech Volume: Increased  Handedness: Right   Mood and Affect  Mood: Irritable; Labile; Angry  Affect: Blunt; Labile   Thought Process  Thought Processes: Disorganized; Irrevelant  Descriptions of Associations:Loose  Orientation:Partial  Thought  Content:Illogical; Rumination; Scattered; Tangential; Delusions  History of Schizophrenia/Schizoaffective disorder:Yes  Duration of Psychotic Symptoms:Greater than six months  Hallucinations:No data recorded Ideas of Reference:Percusatory; Paranoia  Suicidal Thoughts:No data recorded Homicidal Thoughts:No data recorded  Sensorium  Memory: Immediate Fair; Recent Fair; Remote Fair  Judgment: Impaired  Insight: Poor; Lacking   Executive Functions  Concentration: Poor  Attention Span: Poor  Recall: Poor  Fund of Knowledge: Poor  Language: Poor   Psychomotor Activity  Psychomotor Activity:No data recorded  Assets  Assets: Physical Health; Resilience; Housing; Social Support   Sleep  Sleep:No  data recorded    Blood pressure 111/69, pulse 61, temperature 98.1 F (36.7 C), temperature source Oral, resp. rate 18, height 5\' 4"  (1.626 m), weight 40.4 kg, SpO2 100 %. Body mass index is 15.28 kg/m.   Treatment Plan Summary: Daily contact with patient to assess and evaluate symptoms and progress in treatment, Medication management, and Plan continue current medications.  Parks Ranger, DO 08/01/2022, 12:01 PM

## 2022-08-01 NOTE — Progress Notes (Signed)
Patient has been cooperative with treatment. No new behavioral issues to report on shift at this time.

## 2022-08-02 DIAGNOSIS — F251 Schizoaffective disorder, depressive type: Secondary | ICD-10-CM | POA: Diagnosis not present

## 2022-08-02 NOTE — [facility] Group Notes (Signed)
[facility] Group Notes:  (Nursing/MHT/Case Management/Adjunct)  Date:  08/02/2022  Time:  9:22 AM  Type of Therapy:   community meeting  Participation Level:  Did Not Attend   Antonieta Pert 08/02/2022, 9:22 AM

## 2022-08-02 NOTE — Plan of Care (Signed)
  Problem: Education: Goal: Knowledge of General Education information will improve Description: Including pain rating scale, medication(s)/side effects and non-pharmacologic comfort measures Outcome: Progressing   Problem: Nutrition: Goal: Adequate nutrition will be maintained Outcome: Progressing   Problem: Coping: Goal: Level of anxiety will decrease Outcome: Progressing   Problem: Coping: Goal: Coping ability will improve Outcome: Progressing Goal: Will verbalize feelings Outcome: Progressing   Problem: Coping: Goal: Coping ability will improve Outcome: Progressing   Problem: Coping: Goal: Will verbalize feelings Outcome: Progressing   Problem: Health Behavior/Discharge Planning: Goal: Compliance with prescribed medication regimen will improve Outcome: Progressing

## 2022-08-02 NOTE — Plan of Care (Signed)
D- Patient alert and oriented. Patient presents in a sullen, but pleasant mood on assessment stating that she slept "fine" last night and had no complaints to voice to this Probation officer. Patient denies SI, HI, AVH, and pain at this time. Patient also denies any signs/symptoms of depression and anxiety. Patient just appears to be fed up with being in the hospital. Patient had no stated goals for today.  A- Scheduled medications administered to patient, per MD orders. Support and encouragement provided.  Routine safety checks conducted every 15 minutes.  Patient informed to notify staff with problems or concerns.  R- No adverse drug reactions noted. Patient contracts for safety at this time. Patient compliant with medications. Patient receptive, calm, and cooperative. Patient isolates to room, except for meals and medication. She does go down to sit in the dayroom when it's close to meal times. Patient remains safe at this time.  Problem: Education: Goal: Knowledge of General Education information will improve Description: Including pain rating scale, medication(s)/side effects and non-pharmacologic comfort measures Outcome: Progressing   Problem: Health Behavior/Discharge Planning: Goal: Ability to manage health-related needs will improve Outcome: Progressing   Problem: Clinical Measurements: Goal: Ability to maintain clinical measurements within normal limits will improve Outcome: Progressing Goal: Will remain free from infection Outcome: Progressing Goal: Diagnostic test results will improve Outcome: Progressing Goal: Respiratory complications will improve Outcome: Progressing Goal: Cardiovascular complication will be avoided Outcome: Progressing   Problem: Activity: Goal: Risk for activity intolerance will decrease Outcome: Progressing   Problem: Nutrition: Goal: Adequate nutrition will be maintained Outcome: Progressing   Problem: Coping: Goal: Level of anxiety will  decrease Outcome: Progressing   Problem: Elimination: Goal: Will not experience complications related to bowel motility Outcome: Progressing Goal: Will not experience complications related to urinary retention Outcome: Progressing   Problem: Pain Managment: Goal: General experience of comfort will improve Outcome: Progressing   Problem: Safety: Goal: Ability to remain free from injury will improve Outcome: Progressing   Problem: Skin Integrity: Goal: Risk for impaired skin integrity will decrease Outcome: Progressing   Problem: Activity: Goal: Will verbalize the importance of balancing activity with adequate rest periods Outcome: Progressing   Problem: Education: Goal: Will be free of psychotic symptoms Outcome: Progressing Goal: Knowledge of the prescribed therapeutic regimen will improve Outcome: Progressing   Problem: Coping: Goal: Coping ability will improve Outcome: Progressing Goal: Will verbalize feelings Outcome: Progressing   Problem: Health Behavior/Discharge Planning: Goal: Compliance with prescribed medication regimen will improve Outcome: Progressing   Problem: Nutritional: Goal: Ability to achieve adequate nutritional intake will improve Outcome: Progressing   Problem: Role Relationship: Goal: Ability to communicate needs accurately will improve Outcome: Progressing Goal: Ability to interact with others will improve Outcome: Progressing   Problem: Safety: Goal: Ability to redirect hostility and anger into socially appropriate behaviors will improve Outcome: Progressing Goal: Ability to remain free from injury will improve Outcome: Progressing   Problem: Self-Care: Goal: Ability to participate in self-care as condition permits will improve Outcome: Progressing   Problem: Self-Concept: Goal: Will verbalize positive feelings about self Outcome: Progressing   Problem: Education: Goal: Knowledge of [place] [facility] Education  information/materials will improve Outcome: Progressing Goal: Emotional status will improve Outcome: Progressing Goal: Mental status will improve Outcome: Progressing Goal: Verbalization of understanding the information provided will improve Outcome: Progressing   Problem: Activity: Goal: Interest or engagement in activities will improve Outcome: Progressing Goal: Sleeping patterns will improve Outcome: Progressing   Problem: Coping: Goal: Ability to verbalize frustrations and anger  appropriately will improve Outcome: Progressing Goal: Ability to demonstrate self-control will improve Outcome: Progressing   Problem: Health Behavior/Discharge Planning: Goal: Identification of resources available to assist in meeting health care needs will improve Outcome: Progressing Goal: Compliance with treatment plan for underlying cause of condition will improve Outcome: Progressing   Problem: Physical Regulation: Goal: Ability to maintain clinical measurements within normal limits will improve Outcome: Progressing   Problem: Safety: Goal: Periods of time without injury will increase Outcome: Progressing

## 2022-08-02 NOTE — Progress Notes (Signed)
Quiet and reserved, [place] is no problem on the unit. Denies si  hi  avh anxiety and depression at this encounter. She is isolative to herself spending most of her time in her room.  She does come out for meals and snacks. Has some interaction with her peers and engages when spoken to.  She is med  compliant and takes meds without issue.  Q 15 minute safety checks in place.       C Butler-Nicholson, LPN

## 2022-08-02 NOTE — Plan of Care (Signed)
  Problem: Education: Goal: Knowledge of General Education information will improve Description: Including pain rating scale, medication(s)/side effects and non-pharmacologic comfort measures Outcome: Progressing   Problem: Health Behavior/Discharge Planning: Goal: Ability to manage health-related needs will improve Outcome: Progressing   Problem: Clinical Measurements: Goal: Ability to maintain clinical measurements within normal limits will improve Outcome: Progressing Goal: Will remain free from infection Outcome: Progressing Goal: Diagnostic test results will improve Outcome: Progressing Goal: Respiratory complications will improve Outcome: Progressing Goal: Cardiovascular complication will be avoided Outcome: Progressing   Problem: Safety: Goal: Periods of time without injury will increase Outcome: Progressing   Problem: Physical Regulation: Goal: Ability to maintain clinical measurements within normal limits will improve Outcome: Progressing   Problem: Health Behavior/Discharge Planning: Goal: Identification of resources available to assist in meeting health care needs will improve Outcome: Progressing Goal: Compliance with treatment plan for underlying cause of condition will improve Outcome: Progressing

## 2022-08-02 NOTE — Progress Notes (Signed)
[name] MD Progress Note  08/02/2022 12:08 PM [name]  MRN:  [id number] Subjective: [name] is seen on rounds.  No changes.  No complaints.  Compliant with medications and no side effects.  Nurses report no issues. Principal Problem: Schizoaffective disorder, depressive type ([place]) Diagnosis: Principal Problem:   Schizoaffective disorder, depressive type ([place])  Total Time spent with patient: 15 minutes  Past Psychiatric History: History of schizoaffective disorder  Past Medical History:  Past Medical History:  Diagnosis Date   Anemia 10/02/2021   Non compliance w medication regimen    Schizophrenia [facility])     Past Surgical History:  Procedure Laterality Date   BIOPSY  09/25/2021   Procedure: BIOPSY;  Surgeon: Milus Banister, MD;  Location: [facility];  Service: Gastroenterology;;   ESOPHAGOGASTRODUODENOSCOPY (EGD) WITH PROPOFOL [name] 09/25/2021   Procedure: ESOPHAGOGASTRODUODENOSCOPY (EGD) WITH PROPOFOL;  Surgeon: Milus Banister, MD;  Location: [facility];  Service: Gastroenterology;  Laterality: [name];  With NGT placement   Family History: History reviewed. No pertinent family history.  Social History:  Social History   Substance and Sexual Activity  Alcohol Use Not Currently   Comment: Refuses to disclose how much     Social History   Substance and Sexual Activity  Drug Use No   Comment: Refuses to answer    Social History   Socioeconomic History   Marital status: Single    Spouse name: Not on file   Number of children: Not on file   Years of education: Not on file   Highest education level: Not on file  Occupational History   Not on file  Tobacco Use   Smoking status: Every Day    Packs/day: 2.00    Types: Cigarettes   Smokeless tobacco: Never  Substance and Sexual Activity   Alcohol use: Not Currently    Comment: Refuses to disclose how much   Drug use: No    Comment: Refuses to answer   Sexual activity: Not Currently    Comment: refused to answer  Other  Topics Concern   Not on file  Social History Narrative   Not on file   Social Determinants of Health   Financial Resource Strain: Not on file  Food Insecurity: No Food Insecurity (04/08/2022)   Hunger Vital Sign    Worried About Running Out of Food in the Last Year: Never true    Ran Out of Food in the Last Year: Never true  Transportation Needs: No Transportation Needs (04/08/2022)   PRAPARE - Hydrologist (Medical): No    Lack of Transportation (Non-Medical): No  Physical Activity: Not on file  Stress: Not on file  Social Connections: Not on file   Additional Social History:                         Sleep: Good  Appetite:  Good  Current Medications: Current Facility-Administered Medications  Medication Dose Route Frequency Provider Last Rate Last Admin   acetaminophen (TYLENOL) tablet 650 mg  650 mg Oral Q6H PRN [name], [name], MD       alum & mag hydroxide-simeth (MAALOX/MYLANTA) 200-200-20 MG/5ML suspension 30 mL  30 mL Oral Q4H PRN [name], [name], MD       haloperidol (HALDOL) tablet 5 mg  5 mg Oral BID [name], [name], MD   5 mg at 08/02/22 0802   Or   haloperidol lactate (HALDOL) injection 5 mg  5 mg  Intramuscular BID [name], [name], MD       lithium carbonate capsule 600 mg  600 mg Oral QHS [name], [name], MD   600 mg at 08/01/22 2107   LORazepam (ATIVAN) tablet 1 mg  1 mg Oral BID [name], [name], MD   1 mg at 08/02/22 1025   Or   LORazepam (ATIVAN) injection 1 mg  1 mg Intramuscular BID [name], [name], MD       magnesium hydroxide (MILK OF MAGNESIA) suspension 30 mL  30 mL Oral Daily PRN [name], [name], MD       mirtazapine (REMERON) tablet 30 mg  30 mg Oral QHS [name], [name], MD   30 mg at 08/01/22 2107   multivitamin with minerals tablet 1 tablet  1 tablet Oral Daily [name], [name], MD   1 tablet at 08/02/22 0802   QUEtiapine (SEROQUEL) tablet 400 mg  400 mg Oral QHS [name], [name], MD   400 mg at 08/01/22 2106    thiamine (VITAMIN B1) tablet 100 mg  100 mg Oral Daily [name], [name], MD   100 mg at 08/02/22 8527    Lab Results: No results found for this or any previous visit (from the past 48 hour(s)).  Blood Alcohol level:  Lab Results  Component Value Date   ETH <10 03/29/2022   ETH <10 78/24/2353    Metabolic Disorder Labs: Lab Results  Component Value Date   HGBA1C 5.6 10/21/2021   MPG 114 10/21/2021   MPG 111 10/02/2021   Lab Results  Component Value Date   PROLACTIN 13.6 04/16/2015   Lab Results  Component Value Date   CHOL 189 10/21/2021   TRIG 52 10/21/2021   HDL 50 10/21/2021   CHOLHDL 3.8 10/21/2021   VLDL 10 10/21/2021   LDLCALC 129 (H) 10/21/2021   LDLCALC 93 10/01/2020    Physical Findings: AIMS: Facial and Oral Movements Muscles of Facial Expression: None, normal Lips and Perioral Area: None, normal Jaw: None, normal Tongue: None, normal,Extremity Movements Upper (arms, wrists, hands, fingers): None, normal Lower (legs, knees, ankles, toes): None, normal, Trunk Movements Neck, shoulders, hips: None, normal, Overall Severity Severity of abnormal movements (highest score from questions above): None, normal Incapacitation due to abnormal movements: None, normal Patient's awareness of abnormal movements (rate only patient's report): No Awareness, Dental Status Current problems with teeth and/or dentures?: No Does patient usually wear dentures?: No  CIWA:    COWS:     Musculoskeletal: Strength & Muscle Tone: within normal limits Gait & Station: normal Patient leans: [name]  Psychiatric Specialty Exam:  Presentation  General Appearance:  Disheveled  Eye Contact: Fair  Speech: Pressured  Speech Volume: Increased  Handedness: Right   Mood and Affect  Mood: Irritable; Labile; Angry  Affect: Blunt; Labile   Thought Process  Thought Processes: Disorganized; Irrevelant  Descriptions of Associations:Loose  Orientation:Partial  Thought  Content:Illogical; Rumination; Scattered; Tangential; Delusions  History of Schizophrenia/Schizoaffective disorder:Yes  Duration of Psychotic Symptoms:Greater than six months  Hallucinations:No data recorded Ideas of Reference:Percusatory; Paranoia  Suicidal Thoughts:No data recorded Homicidal Thoughts:No data recorded  Sensorium  Memory: Immediate Fair; Recent Fair; Remote Fair  Judgment: Impaired  Insight: Poor; Lacking   Executive Functions  Concentration: Poor  Attention Span: Poor  Recall: Poor  Fund of Knowledge: Poor  Language: Poor   Psychomotor Activity  Psychomotor Activity:No data recorded  Assets  Assets: Physical Health; Resilience; Housing; Social Support   Sleep  Sleep:No data recorded  Blood pressure 113/74, pulse 63, temperature 98.9 F (37.2 C), temperature source Oral, resp. rate 17, height 5\' 4"  (1.626 m), weight 40.4 kg, SpO2 100 %. Body mass index is 15.28 kg/m.   Treatment Plan Summary: Daily contact with patient to assess and evaluate symptoms and progress in treatment, Medication management, and Plan continue current medications.  , DO 08/02/2022, 12:08 PM

## 2022-08-02 NOTE — Progress Notes (Signed)
Remains isolative to herself, but will engage when approached. Only goal and desire is to be discharged.  She is med compliant and takes her meds without incident.  She denies si  hi  avh depression and anxiety.  She is pleasant and cooperative, presents depressed. No issues or concerns needing to be addressed.  Will continue to offer support and encouragement. Q15 minute safety checks in place.     C Butler-Nicholson, LPN

## 2022-08-03 DIAGNOSIS — F251 Schizoaffective disorder, depressive type: Secondary | ICD-10-CM | POA: Diagnosis not present

## 2022-08-03 NOTE — Group Note (Signed)
[facility] LCSW Group Therapy Note    Group Date: 08/03/2022 Start Time: 1300 End Time: 1400  Type of Therapy and Topic:  Group Therapy:  Overcoming Obstacles  Participation Level:  [facility] PARTICIPATION LEVEL: Did Not Attend   Description of Group:   In this group patients will be encouraged to explore what they see as obstacles to their own wellness and recovery. They will be guided to discuss their thoughts, feelings, and behaviors related to these obstacles. The group will process together ways to cope with barriers, with attention given to specific choices patients can make. Each patient will be challenged to identify changes they are motivated to make in order to overcome their obstacles. This group will be process-oriented, with patients participating in exploration of their own experiences as well as giving and receiving support and challenge from other group members.  Therapeutic Goals: 1. Patient will identify personal and current obstacles as they relate to admission. 2. Patient will identify barriers that currently interfere with their wellness or overcoming obstacles.  3. Patient will identify feelings, thought process and behaviors related to these barriers. 4. Patient will identify two changes they are willing to make to overcome these obstacles:    Summary of Patient Progress X   Therapeutic Modalities:   Cognitive Behavioral Therapy Solution Focused Therapy Motivational Interviewing Relapse Prevention Therapy   Zaeem Kandel R Marsha Hillman, LCSW 

## 2022-08-03 NOTE — Progress Notes (Signed)
[facility] MD Progress Note  08/03/2022 11:18 AM [name]  MRN:  [id number] Subjective:  Six 62-year-old woman with schizoaffective disorder.  Patient has no new complaints.  Stays isolated most of the time.  Takes care of her basic hygiene and eats adequately.  When attempts are made to discuss discharge planning patient becomes irate and disorganized again talking about how law enforcement is going to get her out of the hospital. Principal Problem: Schizoaffective disorder, depressive type (HCC) Diagnosis: Principal Problem:   Schizoaffective disorder, depressive type (HCC)  Total Time spent with patient: 20 minutes  Past Psychiatric History: Past history of chronic psychotic disorder  Past Medical History:  Past Medical History:  Diagnosis Date   Anemia 10/02/2021   Non compliance w medication regimen    Schizophrenia (HCC)     Past Surgical History:  Procedure Laterality Date   BIOPSY  09/25/2021   Procedure: BIOPSY;  Surgeon: Rachael Fee, MD;  Location: [facility];  Service: Gastroenterology;;   ESOPHAGOGASTRODUODENOSCOPY (EGD) WITH PROPOFOL [name] 09/25/2021   Procedure: ESOPHAGOGASTRODUODENOSCOPY (EGD) WITH PROPOFOL;  Surgeon: Rachael Fee, MD;  Location: [facility];  Service: Gastroenterology;  Laterality: [name];  With NGT placement   Family History: History reviewed. No pertinent family history. Family Psychiatric  History: See previous Social History:  Social History   Substance and Sexual Activity  Alcohol Use Not Currently   Comment: Refuses to disclose how much     Social History   Substance and Sexual Activity  Drug Use No   Comment: Refuses to answer    Social History   Socioeconomic History   Marital status: Single    Spouse name: Not on file   Number of children: Not on file   Years of education: Not on file   Highest education level: Not on file  Occupational History   Not on file  Tobacco Use   Smoking status: Every Day    Packs/day: 2.00     Types: Cigarettes   Smokeless tobacco: Never  Substance and Sexual Activity   Alcohol use: Not Currently    Comment: Refuses to disclose how much   Drug use: No    Comment: Refuses to answer   Sexual activity: Not Currently    Comment: refused to answer  Other Topics Concern   Not on file  Social History Narrative   Not on file   Social Determinants of Health   Financial Resource Strain: Not on file  Food Insecurity: No Food Insecurity (04/08/2022)   Hunger Vital Sign    Worried About Running Out of Food in the Last Year: Never true    Ran Out of Food in the Last Year: Never true  Transportation Needs: No Transportation Needs (04/08/2022)   PRAPARE - Administrator, Civil Service (Medical): No    Lack of Transportation (Non-Medical): No  Physical Activity: Not on file  Stress: Not on file  Social Connections: Not on file   Additional Social History:                         Sleep: Fair  Appetite:  Fair  Current Medications: Current Facility-Administered Medications  Medication Dose Route Frequency Provider Last Rate Last Admin   acetaminophen (TYLENOL) tablet 650 mg  650 mg Oral Q6H PRN Courtenay Hirth T, MD       alum & mag hydroxide-simeth (MAALOX/MYLANTA) 200-200-20 MG/5ML suspension 30 mL  30 mL Oral Q4H PRN Garielle Mroz T,  MD       haloperidol (HALDOL) tablet 5 mg  5 mg Oral BID Kasee Hantz, [name], MD   5 mg at 08/03/22 7322   Or   haloperidol lactate (HALDOL) injection 5 mg  5 mg Intramuscular BID Aeon Kessner T, MD       lithium carbonate capsule 600 mg  600 mg Oral QHS Artesha Wemhoff T, MD   600 mg at 08/02/22 2104   LORazepam (ATIVAN) tablet 1 mg  1 mg Oral BID Sha Burling T, MD   1 mg at 08/03/22 0818   Or   LORazepam (ATIVAN) injection 1 mg  1 mg Intramuscular BID Quadre Bristol T, MD       magnesium hydroxide (MILK OF MAGNESIA) suspension 30 mL  30 mL Oral Daily PRN Aleesa Sweigert T, MD       mirtazapine (REMERON) tablet 30 mg  30 mg Oral QHS  Starlette Thurow T, MD   30 mg at 08/02/22 2105   multivitamin with minerals tablet 1 tablet  1 tablet Oral Daily Kessa Fairbairn, [name], MD   1 tablet at 08/03/22 0817   QUEtiapine (SEROQUEL) tablet 400 mg  400 mg Oral QHS Elgie Maziarz T, MD   400 mg at 08/02/22 2105   thiamine (VITAMIN B1) tablet 100 mg  100 mg Oral Daily Creola Krotz, [name], MD   100 mg at 08/03/22 0254    Lab Results: No results found for this or any previous visit (from the past 18 hour(s)).  Blood Alcohol level:  Lab Results  Component Value Date   ETH <10 03/29/2022   ETH <10 27/12/2374    Metabolic Disorder Labs: Lab Results  Component Value Date   HGBA1C 5.6 10/21/2021   MPG 114 10/21/2021   MPG 111 10/02/2021   Lab Results  Component Value Date   PROLACTIN 13.6 04/16/2015   Lab Results  Component Value Date   CHOL 189 10/21/2021   TRIG 52 10/21/2021   HDL 50 10/21/2021   CHOLHDL 3.8 10/21/2021   VLDL 10 10/21/2021   LDLCALC 129 (H) 10/21/2021   LDLCALC 93 10/01/2020    Physical Findings: AIMS: Facial and Oral Movements Muscles of Facial Expression: None, normal Lips and Perioral Area: None, normal Jaw: None, normal Tongue: None, normal,Extremity Movements Upper (arms, wrists, hands, fingers): None, normal Lower (legs, knees, ankles, toes): None, normal, Trunk Movements Neck, shoulders, hips: None, normal, Overall Severity Severity of abnormal movements (highest score from questions above): None, normal Incapacitation due to abnormal movements: None, normal Patient's awareness of abnormal movements (rate only patient's report): No Awareness, Dental Status Current problems with teeth and/or dentures?: No Does patient usually wear dentures?: No  CIWA:    COWS:     Musculoskeletal: Strength & Muscle Tone: within normal limits Gait & Station: normal Patient leans: [name]  Psychiatric Specialty Exam:  Presentation  General Appearance:  Disheveled  Eye Contact: Fair  Speech: Pressured  Speech  Volume: Increased  Handedness: Right   Mood and Affect  Mood: Irritable; Labile; Angry  Affect: Blunt; Labile   Thought Process  Thought Processes: Disorganized; Irrevelant  Descriptions of Associations:Loose  Orientation:Partial  Thought Content:Illogical; Rumination; Scattered; Tangential; Delusions  History of Schizophrenia/Schizoaffective disorder:Yes  Duration of Psychotic Symptoms:Greater than six months  Hallucinations:No data recorded Ideas of Reference:Percusatory; Paranoia  Suicidal Thoughts:No data recorded Homicidal Thoughts:No data recorded  Sensorium  Memory: Immediate Fair; Recent Fair; Remote Fair  Judgment: Impaired  Insight: Poor; Lacking   Executive Functions  Concentration:  Poor  Attention Span: Poor  Recall: Poor  Fund of Knowledge: Poor  Language: Poor   Psychomotor Activity  Psychomotor Activity:No data recorded  Assets  Assets: Physical Health; Resilience; Housing; Social Support   Sleep  Sleep:No data recorded   Physical Exam: Physical Exam Vitals and nursing note reviewed.  Constitutional:      Appearance: Normal appearance.  HENT:     Head: Normocephalic and atraumatic.     Mouth/Throat:     Pharynx: Oropharynx is clear.  Eyes:     Pupils: Pupils are equal, round, and reactive to light.  Cardiovascular:     Rate and Rhythm: Normal rate and regular rhythm.  Pulmonary:     Effort: Pulmonary effort is normal.     Breath sounds: Normal breath sounds.  Abdominal:     General: Abdomen is flat.     Palpations: Abdomen is soft.  Musculoskeletal:        General: Normal range of motion.  Skin:    General: Skin is warm and dry.  Neurological:     General: No focal deficit present.     Mental Status: She is alert. Mental status is at baseline.  Psychiatric:        Attention and Perception: She is inattentive.        Mood and Affect: Affect is blunt and angry.        Speech: She is noncommunicative.         Behavior: Behavior is withdrawn.        Thought Content: Thought content normal.        Cognition and Memory: Cognition is impaired.    Review of Systems  Constitutional: Negative.   HENT: Negative.    Eyes: Negative.   Respiratory: Negative.    Cardiovascular: Negative.   Gastrointestinal: Negative.   Musculoskeletal: Negative.   Skin: Negative.   Neurological: Negative.   Psychiatric/Behavioral: Negative.     Blood pressure 107/74, pulse (!) 58, temperature 97.6 F (36.4 C), temperature source Oral, resp. rate 16, height 5\' 4"  (1.626 m), weight 40.4 kg, SpO2 99 %. Body mass index is 15.28 kg/m.   Treatment Plan Summary: Medication management and Plan no change to medication.  Treatment team has been working with her family trying to come up with a reasonable plan.  There is still a consideration being given to reporting guardian to Adult Protective Services for failure to find a place for the patient to stay.  Alethia Berthold, MD 08/03/2022, 11:18 AM

## 2022-08-03 NOTE — Progress Notes (Signed)
Recreation Therapy Notes  Date: 08/03/2022   Time: 10:20 am   Location: Craft room      Behavioral response: [name]   Intervention Topic: Strengths   Discussion/Intervention: Patient refused to attend group.    Clinical Observations/Feedback:  Patient refused to attend group.    [name] Bohanon LRT/CTRS        Jalonda Antigua 08/03/2022 10:42 AM

## 2022-08-03 NOTE — BH IP Treatment Plan (Signed)
Interdisciplinary Treatment and Diagnostic Plan Update  08/03/2022 Time of Session: 08:30AM [name] MRN: [id number]  Principal Diagnosis: Schizoaffective disorder, depressive type ([place])  Secondary Diagnoses: Principal Problem:   Schizoaffective disorder, depressive type ([place])   Current Medications:  Current Facility-Administered Medications  Medication Dose Route Frequency Provider Last Rate Last Admin   acetaminophen (TYLENOL) tablet 650 mg  650 mg Oral Q6H PRN [name], [name], MD       alum & mag hydroxide-simeth (MAALOX/MYLANTA) 200-200-20 MG/5ML suspension 30 mL  30 mL Oral Q4H PRN [name], [name], MD       haloperidol (HALDOL) tablet 5 mg  5 mg Oral BID [name], [name], MD   5 mg at 08/03/22 5638   Or   haloperidol lactate (HALDOL) injection 5 mg  5 mg Intramuscular BID [name], [name], MD       lithium carbonate capsule 600 mg  600 mg Oral QHS [name], [name], MD   600 mg at 08/02/22 2104   LORazepam (ATIVAN) tablet 1 mg  1 mg Oral BID [name], [name], MD   1 mg at 08/03/22 0818   Or   LORazepam (ATIVAN) injection 1 mg  1 mg Intramuscular BID [name], [name], MD       magnesium hydroxide (MILK OF MAGNESIA) suspension 30 mL  30 mL Oral Daily PRN [name], [name], MD       mirtazapine (REMERON) tablet 30 mg  30 mg Oral QHS [name], [name], MD   30 mg at 08/02/22 2105   multivitamin with minerals tablet 1 tablet  1 tablet Oral Daily [name], [name], MD   1 tablet at 08/03/22 0817   QUEtiapine (SEROQUEL) tablet 400 mg  400 mg Oral QHS [name], [name], MD   400 mg at 08/02/22 2105   thiamine (VITAMIN B1) tablet 100 mg  100 mg Oral Daily [name], [name], MD   100 mg at 08/03/22 7564   PTA Medications: Medications Prior to Admission  Medication Sig Dispense Refill Last Dose   clonazePAM (KLONOPIN) 0.5 MG tablet Take 0.5 tablets (0.25 mg total) by mouth 2 (two) times daily. (Patient not taking: Reported on 03/30/2022) 60 tablet 0    haloperidol (HALDOL) 5 MG tablet Take 1 tablet (5  mg total) by mouth 2 (two) times daily. (Patient not taking: Reported on 03/30/2022) 60 tablet 3    midodrine (PROAMATINE) 2.5 MG tablet Take 1 tablet (2.5 mg total) by mouth 3 (three) times daily with meals. (Patient not taking: Reported on 03/30/2022)  0    mirtazapine (REMERON) 15 MG tablet Take 0.5 tablets (7.5 mg total) by mouth at bedtime. (Patient not taking: Reported on 03/30/2022) 30 tablet 2    Multiple Vitamin (MULTIVITAMIN WITH MINERALS) TABS tablet Take 1 tablet by mouth daily. (Patient not taking: Reported on 03/30/2022)      OLANZapine (ZYPREXA) 5 MG tablet Take 1 tablet (5 mg total) by mouth at bedtime. (Patient not taking: Reported on 03/30/2022) 30 tablet 2    thiamine 100 MG tablet Take 1 tablet (100 mg total) by mouth daily. (Patient not taking: Reported on 03/30/2022)       Patient Stressors: Marital or family conflict    Patient Strengths: Supportive family/friends   Treatment Modalities: Medication Management, Group therapy, Case management,  1 to 1 session with clinician, Psychoeducation, Recreational therapy.   Physician Treatment Plan for Primary Diagnosis: Schizoaffective disorder, depressive type ([place]) Long Term Goal(s): Improvement in symptoms so as ready for  discharge   Short Term Goals: Compliance with prescribed medications will improve Ability to verbalize feelings will improve Ability to demonstrate self-control will improve Ability to identify and develop effective coping behaviors will improve  Medication Management: Evaluate patient's response, side effects, and tolerance of medication regimen.  Therapeutic Interventions: 1 to 1 sessions, Unit Group sessions and Medication administration.  Evaluation of Outcomes: Progressing  Physician Treatment Plan for Secondary Diagnosis: Principal Problem:   Schizoaffective disorder, depressive type ([place])  Long Term Goal(s): Improvement in symptoms so as ready for discharge   Short Term Goals: Compliance with  prescribed medications will improve Ability to verbalize feelings will improve Ability to demonstrate self-control will improve Ability to identify and develop effective coping behaviors will improve     Medication Management: Evaluate patient's response, side effects, and tolerance of medication regimen.  Therapeutic Interventions: 1 to 1 sessions, Unit Group sessions and Medication administration.  Evaluation of Outcomes: Progressing   RN Treatment Plan for Primary Diagnosis: Schizoaffective disorder, depressive type ([place]) Long Term Goal(s): Knowledge of disease and therapeutic regimen to maintain health will improve  Short Term Goals: Ability to verbalize frustration and anger appropriately will improve, Ability to demonstrate self-control, Ability to participate in decision making will improve, Ability to verbalize feelings will improve, Ability to identify and develop effective coping behaviors will improve, and Compliance with prescribed medications will improve  Medication Management: RN will administer medications as ordered by provider, will assess and evaluate patient's response and provide education to patient for prescribed medication. RN will report any adverse and/or side effects to prescribing provider.  Therapeutic Interventions: 1 on 1 counseling sessions, Psychoeducation, Medication administration, Evaluate responses to treatment, Monitor vital signs and CBGs as ordered, Perform/monitor CIWA, COWS, AIMS and Fall Risk screenings as ordered, Perform wound care treatments as ordered.  Evaluation of Outcomes: Progressing   LCSW Treatment Plan for Primary Diagnosis: Schizoaffective disorder, depressive type ([place]) Long Term Goal(s): Safe transition to appropriate next level of care at discharge, Engage patient in therapeutic group addressing interpersonal concerns.  Short Term Goals: Engage patient in aftercare planning with referrals and resources, Increase social support,  Increase ability to appropriately verbalize feelings, Increase emotional regulation, Facilitate acceptance of mental health diagnosis and concerns, and Increase skills for wellness and recovery  Therapeutic Interventions: Assess for all discharge needs, 1 to 1 time with Social worker, Explore available resources and support systems, Assess for adequacy in community support network, Educate family and significant other(s) on suicide prevention, Complete Psychosocial Assessment, Interpersonal group therapy.  Evaluation of Outcomes: Progressing   Progress in Treatment: Attending groups: No. Participating in groups: No. Taking medication as prescribed: Yes. Toleration medication: Yes. Family/Significant other contact made: Yes, individual(s) contacted:  SPE completed with guardian/sister. Patient understands diagnosis: No. Discussing patient identified problems/goals with staff: Yes. Medical problems stabilized or resolved: Yes. Denies suicidal/homicidal ideation: Yes. Issues/concerns per patient self-inventory: No. Other: none.  New problem(s) identified: No, Describe:  none  Update 06/29/2022: No changes at this time. Update 07/04/2022: No changes at this time. Update 07/09/22: No changes at this time. Update 07/14/22: No changes at this time.  Update 07/19/2022:   No changes at this time. Update 07/24/2022:  No changes at this time. Update 07/29/22: No changes at this time. Update 08/03/22: No changes at this time.   New Short Term/Long Term Goal(s): Patient to work towards elimination of symptoms of psychosis, medication management for mood stabilization; elimination of SI thoughts; development of comprehensive mental wellness plan.  Update 06/29/2022:  No changes at this time. Update 07/04/2022: No changes at this time. Update 07/09/22: No changes at this time. Update 07/14/22: No changes at this time.   Update 07/19/2022:   No changes at this time.  Update 07/24/2022:  No changes at this time.  Update 07/29/22: No changes at this time. Update 08/03/22: No changes at this time.   Patient Goals:  No additional goals identified at this time. Patient to continue to work towards original goals identified in initial treatment team meeting. CSW will remain available to patient should they voice additional treatment goals. Update 06/29/2022: No changes at this time. Update 07/04/2022: No changes at this time. Update 07/09/22: No changes at this time. Update 07/14/22: No changes at this time.   Update 07/19/2022:   No changes at this time. Update 07/24/2022:  No changes at this time. Update 07/29/22: No changes at this time. Update 08/03/22: No changes at this time.   Discharge Plan or Barriers: Patient lacks adequate housing/supervision at discharge. Patient's guardian refuses to pick patient up from hospital out of safety concerns. CSW team continuing to work with legal guardian to attempt to resolve concerns and identify adequate discharge plan. Update 06/29/2022: Guardian is seeking special assistance medicaid.  She has not completed the paperwork at last update.  Patient remains on the unit and safe, however, no plans have been developed for placement at discharge.  Patient lacks funding for group home, and can not afford out of pocket costs associated with Assisted Living. Update 07/04/2022: Currently seeking ALF/Group Home placement. [facility] reviewing for potential placement. Update 07/09/22: No changes at this time. Update 07/14/22: No changes at this time.   Update 07/19/2022:   No changes at this time. Update 07/24/2022:  Legal guardian continues to decline that patient can return home.  Guardian is now open to the idea of the patient going to a group home, if the home allows for the equipment to monitor the patient. Update 07/29/22: No changes at this time. Update 08/03/22: No changes at this time.   Reason for Continuation of Hospitalization: Other; describe Patient is psychiatrically clear. Patient is  currently boarding.    Estimated Length of Stay: TBD Update 06/29/2022: TBD Update 07/04/2022: No changes at this time. Update 07/09/22: No changes at this time. Update 07/14/22: No changes at this time.   Update 07/19/2022:   No changes at this time. Update 07/24/2022:  TBD Update 07/29/22: TBD. Update 08/03/22: No changes at this time.  Last 3 [place] Suicide Severity Risk Score: Flowsheet Row Admission (Current) from 04/08/2022 in [facility] INPATIENT BEHAVIORAL MEDICINE Admission (Discharged) from 10/17/2021 in [facility] BEHAVIORAL MEDICINE Admission (Discharged) from 10/02/2021 in [facility] REG[facility]AL CARDIAC MED PCU  C-SSRS RISK CATEGORY No Risk No Risk No Risk       Last PHQ 2/9 Scores:     No data to display          Scribe for Treatment Team: Glenis Smoker, LCSW 08/03/2022 10:50 AM

## 2022-08-03 NOTE — BHH Group Notes (Signed)
[facility] Group Notes:  (Nursing/MHT/Case Management/Adjunct)  Date:  08/03/2022  Time:  10:16 PM  Type of Therapy:  Group Therapy  Participation Level:  Minimal  Participation Quality:  Appropriate  Affect:  Appropriate and Flat  Cognitive:  Alert  Insight:  Appropriate  Engagement in Group:  Engaged and Improving  Modes of Intervention:  Discussion  Summary of Progress/Problems:Wrap up group completed during snack time.   Maglione,Treniya Lobb E 08/03/2022, 10:16 PM

## 2022-08-03 NOTE — BHH Counselor (Signed)
CSW spoke with the patient's sister/guardian about discharge planning.  Sister reports that "still checking invoices".  CSW confirmed that sister is referring to Medicaid.  CSW asked for clarification on checking invoices for what and sister stated "have to make sure she still owe the money that I turned into Medicaid, Medicaid is verifying".  CSW asked for clarification on this statement however, sister just repeated this and CSW is unclear on what this means.  Sister further reports that "everything is together that they'[re submitting to the [place] for disability and service connection".  She reports that she is trying to get VA disability started for the patient and the family lawyer is working on this.  CSW asked about patient's monthly SSI and was informed that sister has spend all of the money down as recommended by [employer], per sister.  She reports that she was not able to even leave money for her burial which I didn't think was fair".  She reports that she was not allowed to withdraw the money but did spend it.  CSW informed of plans to have meeting with this CSW and supervisor.  She reports that she is not free until after 12 noon on 08/04/2022.  CSW has made the supervisor aware.  Assunta Curtis, MSW, LCSW 08/03/2022 11:32 AM

## 2022-08-03 NOTE — Plan of Care (Signed)
Patient isolates in her room and visible in the milieu for meal time.Denies SI,HI and AVH. Minimal interactions with staff & peers. No issues verbalized. Appetite and energy level good. Support and encouragement given.

## 2022-08-04 NOTE — Progress Notes (Signed)
[facility] MD Progress Note  08/04/2022 11:00 AM [name]  MRN:  [id number] Subjective: Follow-up 62 year old woman with depression and schizoaffective disorder and chronic psychosis.  No new complaints.  Stays isolated most of the time but is eating and taking care of her health adequately.  Patient continues to be an capable of participating in discussions around discharge.  Family continues to resist plans for discharge leaving [place] with few options. Principal Problem: Schizoaffective disorder, depressive type (HCC) Diagnosis: Principal Problem:   Schizoaffective disorder, depressive type (HCC)  Total Time spent with patient: 30 minutes  Past Psychiatric History: Past history of schizoaffective disorder  Past Medical History:  Past Medical History:  Diagnosis Date   Anemia 10/02/2021   Non compliance w medication regimen    Schizophrenia [facility])     Past Surgical History:  Procedure Laterality Date   BIOPSY  09/25/2021   Procedure: BIOPSY;  Surgeon: Rachael Fee, MD;  Location: [facility];  Service: Gastroenterology;;   ESOPHAGOGASTRODUODENOSCOPY (EGD) WITH PROPOFOL [name] 09/25/2021   Procedure: ESOPHAGOGASTRODUODENOSCOPY (EGD) WITH PROPOFOL;  Surgeon: Rachael Fee, MD;  Location: [facility];  Service: Gastroenterology;  Laterality: [name];  With NGT placement   Family History: History reviewed. No pertinent family history. Family Psychiatric  History: See previous Social History:  Social History   Substance and Sexual Activity  Alcohol Use Not Currently   Comment: Refuses to disclose how much     Social History   Substance and Sexual Activity  Drug Use No   Comment: Refuses to answer    Social History   Socioeconomic History   Marital status: Single    Spouse name: Not on file   Number of children: Not on file   Years of education: Not on file   Highest education level: Not on file  Occupational History   Not on file  Tobacco Use   Smoking status: Every Day     Packs/day: 2.00    Types: Cigarettes   Smokeless tobacco: Never  Substance and Sexual Activity   Alcohol use: Not Currently    Comment: Refuses to disclose how much   Drug use: No    Comment: Refuses to answer   Sexual activity: Not Currently    Comment: refused to answer  Other Topics Concern   Not on file  Social History Narrative   Not on file   Social Determinants of Health   Financial Resource Strain: Not on file  Food Insecurity: No Food Insecurity (04/08/2022)   Hunger Vital Sign    Worried About Running Out of Food in the Last Year: Never true    Ran Out of Food in the Last Year: Never true  Transportation Needs: No Transportation Needs (04/08/2022)   PRAPARE - Administrator, Civil Service (Medical): No    Lack of Transportation (Non-Medical): No  Physical Activity: Not on file  Stress: Not on file  Social Connections: Not on file   Additional Social History:                         Sleep: Fair  Appetite:  Fair  Current Medications: Current Facility-Administered Medications  Medication Dose Route Frequency Provider Last Rate Last Admin   acetaminophen (TYLENOL) tablet 650 mg  650 mg Oral Q6H PRN Jaree Trinka T, MD       alum & mag hydroxide-simeth (MAALOX/MYLANTA) 200-200-20 MG/5ML suspension 30 mL  30 mL Oral Q4H PRN Mayanna Garlitz, [name], MD  haloperidol (HALDOL) tablet 5 mg  5 mg Oral BID Chaysen Tillman, [name], MD   5 mg at 08/04/22 8676   Or   haloperidol lactate (HALDOL) injection 5 mg  5 mg Intramuscular BID Erianna Jolly T, MD       lithium carbonate capsule 600 mg  600 mg Oral QHS Niaya Hickok T, MD   600 mg at 08/03/22 2102   LORazepam (ATIVAN) tablet 1 mg  1 mg Oral BID Edyn Popoca T, MD   1 mg at 08/04/22 1950   Or   LORazepam (ATIVAN) injection 1 mg  1 mg Intramuscular BID Randie Tallarico T, MD       magnesium hydroxide (MILK OF MAGNESIA) suspension 30 mL  30 mL Oral Daily PRN Khaleb Broz T, MD       mirtazapine (REMERON) tablet  30 mg  30 mg Oral QHS Avonell Lenig T, MD   30 mg at 08/03/22 2103   multivitamin with minerals tablet 1 tablet  1 tablet Oral Daily Georgann Bramble, [name], MD   1 tablet at 08/04/22 9326   QUEtiapine (SEROQUEL) tablet 400 mg  400 mg Oral QHS Quadasia Newsham T, MD   400 mg at 08/03/22 2103   thiamine (VITAMIN B1) tablet 100 mg  100 mg Oral Daily Jocabed Cheese, [name], MD   100 mg at 08/04/22 7124    Lab Results: No results found for this or any previous visit (from the past 48 hour(s)).  Blood Alcohol level:  Lab Results  Component Value Date   ETH <10 03/29/2022   ETH <10 58/03/9832    Metabolic Disorder Labs: Lab Results  Component Value Date   HGBA1C 5.6 10/21/2021   MPG 114 10/21/2021   MPG 111 10/02/2021   Lab Results  Component Value Date   PROLACTIN 13.6 04/16/2015   Lab Results  Component Value Date   CHOL 189 10/21/2021   TRIG 52 10/21/2021   HDL 50 10/21/2021   CHOLHDL 3.8 10/21/2021   VLDL 10 10/21/2021   LDLCALC 129 (H) 10/21/2021   LDLCALC 93 10/01/2020    Physical Findings: AIMS: Facial and Oral Movements Muscles of Facial Expression: None, normal Lips and Perioral Area: None, normal Jaw: None, normal Tongue: None, normal,Extremity Movements Upper (arms, wrists, hands, fingers): None, normal Lower (legs, knees, ankles, toes): None, normal, Trunk Movements Neck, shoulders, hips: None, normal, Overall Severity Severity of abnormal movements (highest score from questions above): None, normal Incapacitation due to abnormal movements: None, normal Patient's awareness of abnormal movements (rate only patient's report): No Awareness, Dental Status Current problems with teeth and/or dentures?: No Does patient usually wear dentures?: No  CIWA:    COWS:     Musculoskeletal: Strength & Muscle Tone: within normal limits Gait & Station: normal Patient leans: [name]  Psychiatric Specialty Exam:  Presentation  General Appearance:  Disheveled  Eye  Contact: Fair  Speech: Pressured  Speech Volume: Increased  Handedness: Right   Mood and Affect  Mood: Irritable; Labile; Angry  Affect: Blunt; Labile   Thought Process  Thought Processes: Disorganized; Irrevelant  Descriptions of Associations:Loose  Orientation:Partial  Thought Content:Illogical; Rumination; Scattered; Tangential; Delusions  History of Schizophrenia/Schizoaffective disorder:Yes  Duration of Psychotic Symptoms:Greater than six months  Hallucinations:No data recorded Ideas of Reference:Percusatory; Paranoia  Suicidal Thoughts:No data recorded Homicidal Thoughts:No data recorded  Sensorium  Memory: Immediate Fair; Recent Fair; Remote Fair  Judgment: Impaired  Insight: Poor; Lacking   Executive Functions  Concentration: Poor  Attention Span: Poor  Recall:  Poor  Fund of Knowledge: Poor  Language: Poor   Psychomotor Activity  Psychomotor Activity:No data recorded  Assets  Assets: Physical Health; Resilience; Housing; Social Support   Sleep  Sleep:No data recorded   Physical Exam: Physical Exam Vitals and nursing note reviewed.  Constitutional:      Appearance: Normal appearance.  HENT:     Head: Normocephalic and atraumatic.     Mouth/Throat:     Pharynx: Oropharynx is clear.  Eyes:     Pupils: Pupils are equal, round, and reactive to light.  Cardiovascular:     Rate and Rhythm: Normal rate and regular rhythm.  Pulmonary:     Effort: Pulmonary effort is normal.     Breath sounds: Normal breath sounds.  Abdominal:     General: Abdomen is flat.     Palpations: Abdomen is soft.  Musculoskeletal:        General: Normal range of motion.  Skin:    General: Skin is warm and dry.  Neurological:     General: No focal deficit present.     Mental Status: She is alert. Mental status is at baseline.  Psychiatric:        Mood and Affect: Affect is blunt.        Thought Content: Thought content is paranoid and  delusional.    Review of Systems  Constitutional: Negative.   HENT: Negative.    Eyes: Negative.   Respiratory: Negative.    Cardiovascular: Negative.   Gastrointestinal: Negative.   Musculoskeletal: Negative.   Skin: Negative.   Neurological: Negative.   Psychiatric/Behavioral: Negative.     Blood pressure 107/66, pulse (!) 58, temperature 98.3 F (36.8 C), temperature source Oral, resp. rate 16, height 5\' 4"  (1.626 m), weight 40.4 kg, SpO2 100 %. Body mass index is 15.28 kg/m.   Treatment Plan Summary: Medication management and Plan no change to medication.  No change to overall treatment plan.  We have continued to discuss possible options for discharge but there is little hope at this point.  Alethia Berthold, MD 08/04/2022, 11:00 AM

## 2022-08-04 NOTE — Group Note (Signed)
LCSW Group Therapy Note  Group Date: 08/04/2022 Start Time: 1300 End Time: 1400   Type of Therapy and Topic:  Group Therapy - Healthy vs Unhealthy Coping Skills  Participation Level:  Did Not Attend   Description of Group The focus of this group was to determine what unhealthy coping techniques typically are used by group members and what healthy coping techniques would be helpful in coping with various problems. Patients were guided in becoming aware of the differences between healthy and unhealthy coping techniques. Patients were asked to identify 2-3 healthy coping skills they would like to learn to use more effectively.  Therapeutic Goals Patients learned that coping is what human beings do all day long to deal with various situations in their lives Patients defined and discussed healthy vs unhealthy coping techniques Patients identified their preferred coping techniques and identified whether these were healthy or unhealthy Patients determined 2-3 healthy coping skills they would like to become more familiar with and use more often. Patients provided support and ideas to each other   Summary of Patient Progress:   Patient did not attend group despite encouraged participation.     Therapeutic Modalities Cognitive Behavioral Therapy Motivational Interviewing  Larose Kells 08/04/2022  2:35 PM

## 2022-08-04 NOTE — Plan of Care (Signed)
Patient pleasant and cooperative on approach. When asked for her scrub size patient patient states that she thinks she gained weight. Appetite and energy level good. Denies SI,HI and AVH.Support and encouragement given.

## 2022-08-04 NOTE — Progress Notes (Signed)
Recreation Therapy Notes  Date: 08/04/2022   Time: 10:15 am   Location: Craft room      Behavioral response: [name]   Intervention Topic: Leisure    Discussion/Intervention: Patient refused to attend group.    Clinical Observations/Feedback:  Patient refused to attend group.    Jenavee Laguardia LRT/CTRS        Candiss Galeana 08/04/2022 11:50 AM

## 2022-08-04 NOTE — Progress Notes (Signed)

## 2022-08-04 NOTE — Progress Notes (Signed)
Patient pleasant and cooperative. Medication compliant. Continues to be isolative to self and room.Does come out for meals and meds. Denies SI, HI, AVH. Remains safe on unit with q 15 min checks.

## 2022-08-04 NOTE — [facility] Counselor (Signed)
CSW, TOC Supervisor and patient's guardian met to discuss discharge plans for the patient.    Sister reports that she does not have a plan at this time.  She reports that she was told by Medicaid that she has to prove that the patient owes over $11,000 in outstanding medical bills.  She reports that Medicaid is currently verifying the invoices at this time.  She reports that once verified the patient will be provided a Medicaid number.  She reports that she has attempted to get the patient qualified for Medicaid and patient has been determined to be ineligible.   Tentative plan was developed to have the patient return home, while mother stayed with a different family member temporarily.  Guardian is to follow up with mother and then report back to CSW and [facility] supervisor.   CSW is to reach out to ACTT team, [place], [phone] to obtain additional information.  Speak with patient on returning home and tentative plan, including psychological referral and development of a safety plan.  Follow up meeting scheduled for 2:15PM 08/06/2022.  Assunta Curtis, MSW, LCSW 08/04/2022 3:52 PM

## 2022-08-04 NOTE — Plan of Care (Signed)
  Problem: Coping: ?Goal: Level of anxiety will decrease ?Outcome: Progressing ?  ?Problem: Safety: ?Goal: Ability to remain free from injury will improve ?Outcome: Progressing ?  ?

## 2022-08-04 NOTE — [facility] Group Notes (Signed)
[place] Group Notes:  (Nursing/MHT/Case Management/Adjunct)  Date:  08/04/2022  Time:  9:54 AM  Type of Therapy:   Community Meeting  Participation Level:  Did Not Attend   Adela Lank [facility] 08/04/2022, 9:54 AM

## 2022-08-05 NOTE — Progress Notes (Signed)
Patient denies SI,HI, and A/V/H with no plan/intent. Patient denies any pain or discomfort. Reports sleeping well and good appetite. Patient verbalizes no further concerns at this time and remains cooperative on unit.     08/05/22 0800  Psych Admission Type (Psych Patients Only)  Admission Status Involuntary  Psychosocial Assessment  Patient Complaints None  Eye Contact Fair  Facial Expression Flat  Affect Appropriate to circumstance  Speech Logical/coherent  Interaction Assertive  Motor Activity Slow  Appearance/Hygiene Unremarkable  Behavior Characteristics Cooperative  Mood Depressed  Thought Process  Coherency WDL  Content WDL  Delusions None reported or observed  Perception WDL  Hallucination None reported or observed  Judgment Limited  Confusion None  Danger to Self  Current suicidal ideation? Denies  Agreement Not to Harm Self Yes  Description of Agreement verbally contracts for safety  Danger to Others  Danger to Others None reported or observed  Danger to Others Abnormal  Harmful Behavior to others No threats or harm toward other people  Destructive Behavior No threats or harm toward property

## 2022-08-05 NOTE — Progress Notes (Signed)
Recreation Therapy Notes  Date: 08/05/2022   Time: 10:25 am   Location: Craft room      Behavioral response: [name]   Intervention Topic: Stress Management    Discussion/Intervention: Patient refused to attend group.    Clinical Observations/Feedback:  Patient refused to attend group.    Denis Koppel LRT/CTRS        Vanessia Bokhari 08/05/2022 11:52 AM

## 2022-08-05 NOTE — [facility] Counselor (Addendum)
ADDENDUM CSW followed up with call to [place], [phone].  [place] confirmed that she does not work on an Agricultural consultant with the patient.  Beaulah Dinning is a Gaffer with [employer].  She reports that her role is to assist with independent housing.  She reports however that at this time the patient is not appropriate due to not having Medicaid as well as being "unstable".    Assunta Curtis, MSW, LCSW 08/06/2022 11:44 AM   CSW reached out to [facility] team, [place], [phone] .  Per voicemail Beaulah Dinning is not with an ACTT team as believed, but perhaps a Glass blower/designer with [employer].   Assunta Curtis, MSW, LCSW 08/05/2022 4:09 PM

## 2022-08-05 NOTE — Group Note (Signed)
[facility] LCSW Group Therapy Note   Group Date: 08/05/2022 Start Time: 1300 End Time: 1400   Type of Therapy/Topic:  Group Therapy:  Emotion Regulation  Participation Level:  Did Not Attend   Mood:  Description of Group:    The purpose of this group is to assist patients in learning to regulate negative emotions and experience positive emotions. Patients will be guided to discuss ways in which they have been vulnerable to their negative emotions. These vulnerabilities will be juxtaposed with experiences of positive emotions or situations, and patients challenged to use positive emotions to combat negative ones. Special emphasis will be placed on coping with negative emotions in conflict situations, and patients will process healthy conflict resolution skills.  Therapeutic Goals: Patient will identify two positive emotions or experiences to reflect on in order to balance out negative emotions:  Patient will label two or more emotions that they find the most difficult to experience:  Patient will be able to demonstrate positive conflict resolution skills through discussion or role plays:   Summary of Patient Progress:   X    Therapeutic Modalities:   Cognitive Behavioral Therapy Feelings Identification Dialectical Behavioral Therapy   [name] J Qamar Rosman, LCSW 

## 2022-08-05 NOTE — Progress Notes (Signed)
[facility] MD Progress Note  08/05/2022 10:04 AM [name]  MRN:  [id number] Subjective: Follow-up 62 year old woman with schizoaffective disorder.  Clinical presentation no different today than what we have been seeing.  Eats and takes care of her basic hygiene but otherwise mostly stays isolated.  Affect usually calm and less you bring up issues about which she is delusional.  Treatment team today discussed that social work supervisors are now recommending that we insist on discharge of the patient given that she has a guardian who can take her.  We are going to try to insist on discharge on Friday to home and outpatient treatment.  Patient was updated about this. Principal Problem: Schizoaffective disorder, depressive type ([place]) Diagnosis: Principal Problem:   Schizoaffective disorder, depressive type ([place])  Total Time spent with patient: 30 minutes  Past Psychiatric History: Long history of chronic mental health problems  Past Medical History:  Past Medical History:  Diagnosis Date   Anemia 10/02/2021   Non compliance w medication regimen    Schizophrenia [facility])     Past Surgical History:  Procedure Laterality Date   BIOPSY  09/25/2021   Procedure: BIOPSY;  Surgeon: Milus Banister, MD;  Location: [facility];  Service: Gastroenterology;;   ESOPHAGOGASTRODUODENOSCOPY (EGD) WITH PROPOFOL [name] 09/25/2021   Procedure: ESOPHAGOGASTRODUODENOSCOPY (EGD) WITH PROPOFOL;  Surgeon: Milus Banister, MD;  Location: [facility];  Service: Gastroenterology;  Laterality: [name];  With NGT placement   Family History: History reviewed. No pertinent family history. Family Psychiatric  History: See previous Social History:  Social History   Substance and Sexual Activity  Alcohol Use Not Currently   Comment: Refuses to disclose how much     Social History   Substance and Sexual Activity  Drug Use No   Comment: Refuses to answer    Social History   Socioeconomic History   Marital status: Single     Spouse name: Not on file   Number of children: Not on file   Years of education: Not on file   Highest education level: Not on file  Occupational History   Not on file  Tobacco Use   Smoking status: Every Day    Packs/day: 2.00    Types: Cigarettes   Smokeless tobacco: Never  Substance and Sexual Activity   Alcohol use: Not Currently    Comment: Refuses to disclose how much   Drug use: No    Comment: Refuses to answer   Sexual activity: Not Currently    Comment: refused to answer  Other Topics Concern   Not on file  Social History Narrative   Not on file   Social Determinants of Health   Financial Resource Strain: Not on file  Food Insecurity: No Food Insecurity (04/08/2022)   Hunger Vital Sign    Worried About Running Out of Food in the Last Year: Never true    Ran Out of Food in the Last Year: Never true  Transportation Needs: No Transportation Needs (04/08/2022)   PRAPARE - Hydrologist (Medical): No    Lack of Transportation (Non-Medical): No  Physical Activity: Not on file  Stress: Not on file  Social Connections: Not on file   Additional Social History:                         Sleep: Fair  Appetite:  Fair  Current Medications: Current Facility-Administered Medications  Medication Dose Route Frequency Provider Last Rate Last Admin  acetaminophen (TYLENOL) tablet 650 mg  650 mg Oral Q6H PRN Elizer Bostic T, MD       alum & mag hydroxide-simeth (MAALOX/MYLANTA) 200-200-20 MG/5ML suspension 30 mL  30 mL Oral Q4H PRN Tareka Jhaveri T, MD       haloperidol (HALDOL) tablet 5 mg  5 mg Oral BID Laniqua Torrens, [name], MD   5 mg at 08/05/22 9528   Or   haloperidol lactate (HALDOL) injection 5 mg  5 mg Intramuscular BID Delcie Ruppert T, MD       lithium carbonate capsule 600 mg  600 mg Oral QHS Merideth Bosque T, MD   600 mg at 08/04/22 2100   LORazepam (ATIVAN) tablet 1 mg  1 mg Oral BID Sweta Halseth T, MD   1 mg at 08/05/22 4132   Or    LORazepam (ATIVAN) injection 1 mg  1 mg Intramuscular BID Elsie Baynes T, MD       magnesium hydroxide (MILK OF MAGNESIA) suspension 30 mL  30 mL Oral Daily PRN Rachal Dvorsky T, MD       mirtazapine (REMERON) tablet 30 mg  30 mg Oral QHS Kailly Richoux T, MD   30 mg at 08/04/22 2100   multivitamin with minerals tablet 1 tablet  1 tablet Oral Daily Emilio Baylock, [name], MD   1 tablet at 08/05/22 4401   QUEtiapine (SEROQUEL) tablet 400 mg  400 mg Oral QHS Rilea Arutyunyan T, MD   400 mg at 08/04/22 2100   thiamine (VITAMIN B1) tablet 100 mg  100 mg Oral Daily Emonie Espericueta, [name], MD   100 mg at 08/05/22 0272    Lab Results: No results found for this or any previous visit (from the past 48 hour(s)).  Blood Alcohol level:  Lab Results  Component Value Date   ETH <10 03/29/2022   ETH <10 09/12/2021    Metabolic Disorder Labs: Lab Results  Component Value Date   HGBA1C 5.6 10/21/2021   MPG 114 10/21/2021   MPG 111 10/02/2021   Lab Results  Component Value Date   PROLACTIN 13.6 04/16/2015   Lab Results  Component Value Date   CHOL 189 10/21/2021   TRIG 52 10/21/2021   HDL 50 10/21/2021   CHOLHDL 3.8 10/21/2021   VLDL 10 10/21/2021   LDLCALC 129 (H) 10/21/2021   LDLCALC 93 10/01/2020    Physical Findings: AIMS: Facial and Oral Movements Muscles of Facial Expression: None, normal Lips and Perioral Area: None, normal Jaw: None, normal Tongue: None, normal,Extremity Movements Upper (arms, wrists, hands, fingers): None, normal Lower (legs, knees, ankles, toes): None, normal, Trunk Movements Neck, shoulders, hips: None, normal, Overall Severity Severity of abnormal movements (highest score from questions above): None, normal Incapacitation due to abnormal movements: None, normal Patient's awareness of abnormal movements (rate only patient's report): No Awareness, Dental Status Current problems with teeth and/or dentures?: No Does patient usually wear dentures?: No  CIWA:    COWS:      Musculoskeletal: Strength & Muscle Tone: within normal limits Gait & Station: normal Patient leans: [name]  Psychiatric Specialty Exam:  Presentation  General Appearance:  Disheveled  Eye Contact: Fair  Speech: Pressured  Speech Volume: Increased  Handedness: Right   Mood and Affect  Mood: Irritable; Labile; Angry  Affect: Blunt; Labile   Thought Process  Thought Processes: Disorganized; Irrevelant  Descriptions of Associations:Loose  Orientation:Partial  Thought Content:Illogical; Rumination; Scattered; Tangential; Delusions  History of Schizophrenia/Schizoaffective disorder:Yes  Duration of Psychotic Symptoms:Greater than six months  Hallucinations:No data recorded Ideas of Reference:Percusatory; Paranoia  Suicidal Thoughts:No data recorded Homicidal Thoughts:No data recorded  Sensorium  Memory: Immediate Fair; Recent Fair; Remote Fair  Judgment: Impaired  Insight: Poor; Lacking   Executive Functions  Concentration: Poor  Attention Span: Poor  Recall: Poor  Fund of Knowledge: Poor  Language: Poor   Psychomotor Activity  Psychomotor Activity:No data recorded  Assets  Assets: Physical Health; Resilience; Housing; Social Support   Sleep  Sleep:No data recorded   Physical Exam: Physical Exam Vitals and nursing note reviewed.  Constitutional:      Appearance: Normal appearance.  HENT:     Head: Normocephalic and atraumatic.     Mouth/Throat:     Pharynx: Oropharynx is clear.  Eyes:     Pupils: Pupils are equal, round, and reactive to light.  Cardiovascular:     Rate and Rhythm: Normal rate and regular rhythm.  Pulmonary:     Effort: Pulmonary effort is normal.     Breath sounds: Normal breath sounds.  Abdominal:     General: Abdomen is flat.     Palpations: Abdomen is soft.  Musculoskeletal:        General: Normal range of motion.  Skin:    General: Skin is warm and dry.  Neurological:     General: No  focal deficit present.     Mental Status: She is alert. Mental status is at baseline.  Psychiatric:        Attention and Perception: Attention normal.        Mood and Affect: Mood normal. Affect is blunt.        Speech: Speech is tangential.        Behavior: Behavior is cooperative.        Thought Content: Thought content is paranoid and delusional.        Cognition and Memory: Cognition is impaired.    Review of Systems  Constitutional: Negative.   HENT: Negative.    Eyes: Negative.   Respiratory: Negative.    Cardiovascular: Negative.   Gastrointestinal: Negative.   Musculoskeletal: Negative.   Skin: Negative.   Neurological: Negative.   Psychiatric/Behavioral: Negative.     Blood pressure 123/70, pulse (!) 53, temperature 98 F (36.7 C), temperature source Oral, resp. rate 18, height 5\' 4"  (1.626 m), weight 40.4 kg, SpO2 100 %. Body mass index is 15.28 kg/m.   Treatment Plan Summary: Plan no change to medication.  It is my understanding that we are planning on discharge on Friday.  We will start making preparations for that.  Patient advised that while plans may sometimes fall through that there is at least a good chance that we will be discharging her day after tomorrow.  Alethia Berthold, MD 08/05/2022, 10:04 AM

## 2022-08-05 NOTE — Plan of Care (Signed)
  Problem: Safety: Goal: Ability to remain free from injury will improve Outcome: Progressing   Problem: Health Behavior/Discharge Planning: Goal: Compliance with prescribed medication regimen will improve Outcome: Progressing

## 2022-08-06 ENCOUNTER — Other Ambulatory Visit: Payer: Self-pay

## 2022-08-06 MED ORDER — MIRTAZAPINE 30 MG PO TABS: 30.0000 mg | ORAL_TABLET | Freq: Every day | ORAL | 1 refills | Status: DC

## 2022-08-06 MED ORDER — THIAMINE HCL 100 MG PO TABS
100.0000 mg | ORAL_TABLET | Freq: Every day | ORAL | 0 refills | Status: DC
  Filled 2022-08-06: qty 10, 10d supply, fill #0

## 2022-08-06 MED ORDER — THIAMINE HCL 100 MG PO TABS: 100.0000 mg | ORAL_TABLET | Freq: Every day | ORAL | 1 refills | Status: DC

## 2022-08-06 MED ORDER — NICOTINE 7 MG/24HR TD PT24: 7.0000 mg | MEDICATED_PATCH | Freq: Every day | TRANSDERMAL | 1 refills | Status: DC

## 2022-08-06 MED ORDER — LITHIUM CARBONATE 300 MG PO CAPS
600.0000 mg | ORAL_CAPSULE | Freq: Every day | ORAL | 0 refills | Status: DC
  Filled 2022-08-06: qty 20, 10d supply, fill #0

## 2022-08-06 MED ORDER — MIRTAZAPINE 30 MG PO TABS
30.0000 mg | ORAL_TABLET | Freq: Every day | ORAL | 0 refills | Status: DC
  Filled 2022-08-06: qty 10, 10d supply, fill #0

## 2022-08-06 MED ORDER — QUETIAPINE FUMARATE 400 MG PO TABS: 400.0000 mg | ORAL_TABLET | Freq: Every day | ORAL | 1 refills | Status: DC

## 2022-08-06 MED ORDER — OLANZAPINE 15 MG PO TABS
30.0000 mg | ORAL_TABLET | Freq: Every day | ORAL | 0 refills | Status: DC
  Filled 2022-08-06: qty 20, 10d supply, fill #0

## 2022-08-06 MED ORDER — QUETIAPINE FUMARATE 400 MG PO TABS
400.0000 mg | ORAL_TABLET | Freq: Every day | ORAL | 0 refills | Status: DC
  Filled 2022-08-06: qty 10, 10d supply, fill #0

## 2022-08-06 MED ORDER — NICOTINE 7 MG/24HR TD PT24
7.0000 mg | MEDICATED_PATCH | Freq: Every day | TRANSDERMAL | 0 refills | Status: DC
  Filled 2022-08-06: qty 14, 14d supply, fill #0

## 2022-08-06 MED ORDER — HALOPERIDOL 5 MG PO TABS: 5.0000 mg | ORAL_TABLET | Freq: Two times a day (BID) | ORAL | 1 refills | Status: DC

## 2022-08-06 MED ORDER — OLANZAPINE 15 MG PO TABS: 30.0000 mg | ORAL_TABLET | Freq: Every day | ORAL | 1 refills | Status: DC

## 2022-08-06 MED ORDER — HALOPERIDOL 5 MG PO TABS
5.0000 mg | ORAL_TABLET | Freq: Two times a day (BID) | ORAL | 0 refills | Status: DC
  Filled 2022-08-06: qty 20, 10d supply, fill #0

## 2022-08-06 MED ORDER — ADULT MULTIVITAMIN W/MINERALS CH
1.0000 | ORAL_TABLET | Freq: Every day | ORAL | 0 refills | Status: DC
  Filled 2022-08-06: qty 10, 10d supply, fill #0

## 2022-08-06 MED ORDER — ADULT MULTIVITAMIN W/MINERALS CH: 1.0000 | ORAL_TABLET | Freq: Every day | ORAL | 1 refills | Status: DC

## 2022-08-06 MED ORDER — LITHIUM CARBONATE 600 MG PO CAPS: 600.0000 mg | ORAL_CAPSULE | Freq: Every day | ORAL | 1 refills | Status: DC

## 2022-08-06 NOTE — H&P (Signed)
CSW team has complete referral for ACT with the following: [facility] [employer] [facility]  Confirmation that all were faxes were successful.  Assunta Curtis, MSW, LCSW 08/06/2022 4:09 PM

## 2022-08-06 NOTE — Group Note (Signed)
[facility] LCSW Group Therapy Note   Group Date: 08/06/2022 Start Time: 1300 End Time: 1400   Type of Therapy/Topic:  Group Therapy:  Balance in Life  Participation Level:  Did Not Attend   Description of Group:    This group will address the concept of balance and how it feels and looks when one is unbalanced. Patients will be encouraged to process areas in their lives that are out of balance, and identify reasons for remaining unbalanced. Facilitators will guide patients utilizing problem- solving interventions to address and correct the stressor making their life unbalanced. Understanding and applying boundaries will be explored and addressed for obtaining  and maintaining a balanced life. Patients will be encouraged to explore ways to assertively make their unbalanced needs known to significant others in their lives, using other group members and facilitator for support and feedback.  Therapeutic Goals: Patient will identify two or more emotions or situations they have that consume much of in their lives. Patient will identify signs/triggers that life has become out of balance:  Patient will identify two ways to set boundaries in order to achieve balance in their lives:  Patient will demonstrate ability to communicate their needs through discussion and/or role plays  Summary of Patient Progress: X   Therapeutic Modalities:   Cognitive Behavioral Therapy Solution-Focused Therapy Assertiveness Training   Shaivi Rothschild R Ramzey Petrovic, LCSW 

## 2022-08-06 NOTE — Plan of Care (Signed)
  Problem: Education: Goal: Knowledge of General Education information will improve Description: Including pain rating scale, medication(s)/side effects and non-pharmacologic comfort measures Outcome: Progressing   Problem: Health Behavior/Discharge Planning: Goal: Ability to manage health-related needs will improve Outcome: Progressing   Problem: Clinical Measurements: Goal: Ability to maintain clinical measurements within normal limits will improve Outcome: Progressing Goal: Will remain free from infection Outcome: Progressing Goal: Diagnostic test results will improve Outcome: Progressing Goal: Respiratory complications will improve Outcome: Progressing Goal: Cardiovascular complication will be avoided Outcome: Progressing   Problem: Safety: Goal: Periods of time without injury will increase Outcome: Progressing   Problem: Physical Regulation: Goal: Ability to maintain clinical measurements within normal limits will improve Outcome: Progressing   Problem: Health Behavior/Discharge Planning: Goal: Identification of resources available to assist in meeting health care needs will improve Outcome: Progressing Goal: Compliance with treatment plan for underlying cause of condition will improve Outcome: Progressing   

## 2022-08-06 NOTE — Progress Notes (Signed)
[facility] MD Progress Note  08/06/2022 12:40 PM [name]  MRN:  [id number] Subjective: Follow-up patient with schizoaffective disorder.  No change clinically.  As mentioned before she stays mostly isolated but is eating well and appears to be in stable health and has been medicine compliant.  I updated her today that our tentative plan remains to attempt discharge tomorrow. Principal Problem: Schizoaffective disorder, depressive type ([place]) Diagnosis: Principal Problem:   Schizoaffective disorder, depressive type ([place])  Total Time spent with patient: 30 minutes  Past Psychiatric History: Past history of schizoaffective disorder  Past Medical History:  Past Medical History:  Diagnosis Date   Anemia 10/02/2021   Non compliance w medication regimen    Schizophrenia [facility])     Past Surgical History:  Procedure Laterality Date   BIOPSY  09/25/2021   Procedure: BIOPSY;  Surgeon: Milus Banister, MD;  Location: [facility];  Service: Gastroenterology;;   ESOPHAGOGASTRODUODENOSCOPY (EGD) WITH PROPOFOL [name] 09/25/2021   Procedure: ESOPHAGOGASTRODUODENOSCOPY (EGD) WITH PROPOFOL;  Surgeon: Milus Banister, MD;  Location: [facility];  Service: Gastroenterology;  Laterality: [name];  With NGT placement   Family History: History reviewed. No pertinent family history. Family Psychiatric  History: See previous Social History:  Social History   Substance and Sexual Activity  Alcohol Use Not Currently   Comment: Refuses to disclose how much     Social History   Substance and Sexual Activity  Drug Use No   Comment: Refuses to answer    Social History   Socioeconomic History   Marital status: Single    Spouse name: Not on file   Number of children: Not on file   Years of education: Not on file   Highest education level: Not on file  Occupational History   Not on file  Tobacco Use   Smoking status: Every Day    Packs/day: 2.00    Types: Cigarettes   Smokeless tobacco: Never  Substance and  Sexual Activity   Alcohol use: Not Currently    Comment: Refuses to disclose how much   Drug use: No    Comment: Refuses to answer   Sexual activity: Not Currently    Comment: refused to answer  Other Topics Concern   Not on file  Social History Narrative   Not on file   Social Determinants of Health   Financial Resource Strain: Not on file  Food Insecurity: No Food Insecurity (04/08/2022)   Hunger Vital Sign    Worried About Running Out of Food in the Last Year: Never true    Ran Out of Food in the Last Year: Never true  Transportation Needs: No Transportation Needs (04/08/2022)   PRAPARE - Hydrologist (Medical): No    Lack of Transportation (Non-Medical): No  Physical Activity: Not on file  Stress: Not on file  Social Connections: Not on file   Additional Social History:                         Sleep: Fair  Appetite:  Fair  Current Medications: Current Facility-Administered Medications  Medication Dose Route Frequency Provider Last Rate Last Admin   acetaminophen (TYLENOL) tablet 650 mg  650 mg Oral Q6H PRN Zackarie Chason T, MD       alum & mag hydroxide-simeth (MAALOX/MYLANTA) 200-200-20 MG/5ML suspension 30 mL  30 mL Oral Q4H PRN Toran Murch T, MD       haloperidol (HALDOL) tablet 5 mg  5 mg Oral BID Sally-Anne Wamble, [name], MD   5 mg at 08/06/22 3382   Or   haloperidol lactate (HALDOL) injection 5 mg  5 mg Intramuscular BID Aileene Lanum T, MD       lithium carbonate capsule 600 mg  600 mg Oral QHS Jenel Gierke T, MD   600 mg at 08/05/22 2132   LORazepam (ATIVAN) tablet 1 mg  1 mg Oral BID Catrinia Racicot T, MD   1 mg at 08/06/22 5053   Or   LORazepam (ATIVAN) injection 1 mg  1 mg Intramuscular BID Daking Westervelt T, MD       magnesium hydroxide (MILK OF MAGNESIA) suspension 30 mL  30 mL Oral Daily PRN Belicia Difatta T, MD       mirtazapine (REMERON) tablet 30 mg  30 mg Oral QHS Blanch Stang T, MD   30 mg at 08/05/22 2132   multivitamin  with minerals tablet 1 tablet  1 tablet Oral Daily Juventino Pavone, [name], MD   1 tablet at 08/06/22 9767   QUEtiapine (SEROQUEL) tablet 400 mg  400 mg Oral QHS Srijan Givan T, MD   400 mg at 08/05/22 2132   thiamine (VITAMIN B1) tablet 100 mg  100 mg Oral Daily Reiner Loewen, [name], MD   100 mg at 08/06/22 3419    Lab Results: No results found for this or any previous visit (from the past 38 hour(s)).  Blood Alcohol level:  Lab Results  Component Value Date   ETH <10 03/29/2022   ETH <10 37/90/2409    Metabolic Disorder Labs: Lab Results  Component Value Date   HGBA1C 5.6 10/21/2021   MPG 114 10/21/2021   MPG 111 10/02/2021   Lab Results  Component Value Date   PROLACTIN 13.6 04/16/2015   Lab Results  Component Value Date   CHOL 189 10/21/2021   TRIG 52 10/21/2021   HDL 50 10/21/2021   CHOLHDL 3.8 10/21/2021   VLDL 10 10/21/2021   LDLCALC 129 (H) 10/21/2021   LDLCALC 93 10/01/2020    Physical Findings: AIMS: Facial and Oral Movements Muscles of Facial Expression: None, normal Lips and Perioral Area: None, normal Jaw: None, normal Tongue: None, normal,Extremity Movements Upper (arms, wrists, hands, fingers): None, normal Lower (legs, knees, ankles, toes): None, normal, Trunk Movements Neck, shoulders, hips: None, normal, Overall Severity Severity of abnormal movements (highest score from questions above): None, normal Incapacitation due to abnormal movements: None, normal Patient's awareness of abnormal movements (rate only patient's report): No Awareness, Dental Status Current problems with teeth and/or dentures?: No Does patient usually wear dentures?: No  CIWA:    COWS:     Musculoskeletal: Strength & Muscle Tone: within normal limits Gait & Station: normal Patient leans: [name]  Psychiatric Specialty Exam:  Presentation  General Appearance:  Disheveled  Eye Contact: Fair  Speech: Pressured  Speech Volume: Increased  Handedness: Right   Mood and  Affect  Mood: Irritable; Labile; Angry  Affect: Blunt; Labile   Thought Process  Thought Processes: Disorganized; Irrevelant  Descriptions of Associations:Loose  Orientation:Partial  Thought Content:Illogical; Rumination; Scattered; Tangential; Delusions  History of Schizophrenia/Schizoaffective disorder:Yes  Duration of Psychotic Symptoms:Greater than six months  Hallucinations:No data recorded Ideas of Reference:Percusatory; Paranoia  Suicidal Thoughts:No data recorded Homicidal Thoughts:No data recorded  Sensorium  Memory: Immediate Fair; Recent Fair; Remote Fair  Judgment: Impaired  Insight: Poor; Lacking   Executive Functions  Concentration: Poor  Attention Span: Poor  Recall: Poor  Fund of Knowledge: Poor  Language: Poor   Psychomotor Activity  Psychomotor Activity:No data recorded  Assets  Assets: Physical Health; Resilience; Housing; Social Support   Sleep  Sleep:No data recorded   Physical Exam: Physical Exam Vitals and nursing note reviewed.  Constitutional:      Appearance: Normal appearance.  HENT:     Head: Normocephalic and atraumatic.     Mouth/Throat:     Pharynx: Oropharynx is clear.  Eyes:     Pupils: Pupils are equal, round, and reactive to light.  Cardiovascular:     Rate and Rhythm: Normal rate and regular rhythm.  Pulmonary:     Effort: Pulmonary effort is normal.     Breath sounds: Normal breath sounds.  Abdominal:     General: Abdomen is flat.     Palpations: Abdomen is soft.  Musculoskeletal:        General: Normal range of motion.  Skin:    General: Skin is warm and dry.  Neurological:     General: No focal deficit present.     Mental Status: She is alert. Mental status is at baseline.  Psychiatric:        Attention and Perception: She is inattentive.        Mood and Affect: Mood normal. Affect is blunt.        Speech: Speech normal.        Behavior: Behavior is withdrawn.        Thought  Content: Thought content normal.    Review of Systems  Constitutional: Negative.   HENT: Negative.    Eyes: Negative.   Respiratory: Negative.    Cardiovascular: Negative.   Gastrointestinal: Negative.   Musculoskeletal: Negative.   Skin: Negative.   Neurological: Negative.   Psychiatric/Behavioral: Negative.     Blood pressure 107/66, pulse 65, temperature 97.8 F (36.6 C), temperature source Oral, resp. rate 18, height 5\' 4"  (1.626 m), weight 40.4 kg, SpO2 100 %. Body mass index is 15.28 kg/m.   Treatment Plan Summary: Medication management and Plan preparations will be made for a planned discharge tomorrow.  No change to medication before then.  Reviewed the plan with the patient who has no questions or complaints.  , MD 08/06/2022, 12:40 PM

## 2022-08-06 NOTE — [facility] Group Notes (Signed)
[facility] Group Notes:  (Nursing/MHT/Case Management/Adjunct)  Date:  08/06/2022  Time:  9:50 PM  Type of Therapy:   Wrap up  Participation Level:  Active  Participation Quality:  Appropriate  Affect:  Appropriate  Cognitive:  Alert  Insight:  Good  Engagement in Group:  Engaged  Modes of Intervention:  Support  Summary of Progress/Problems:  [name] 08/06/2022, 9:50 PM 

## 2022-08-06 NOTE — Plan of Care (Signed)
D- Patient alert and oriented. Patient presented in a pleasant mood on assessment reporting that she slept fair last night and had no complaints to voice to this Probation officer. Patient denied SI, HI, AVH, and pain at this time. Patient also denied any signs/symptoms of depression and anxiety. Patient had no stated goals for today.   A- Scheduled medications administered to patient, per MD orders. Support and encouragement provided.  Routine safety checks conducted every 15 minutes.  Patient informed to notify staff with problems or concerns.  R- No adverse drug reactions noted. Patient contracts for safety at this time. Patient compliant with medications and treatment plan. Patient receptive, calm, and cooperative. Patient remains safe at this time.  Problem: Education: Goal: Knowledge of General Education information will improve Description: Including pain rating scale, medication(s)/side effects and non-pharmacologic comfort measures Outcome: Progressing   Problem: Health Behavior/Discharge Planning: Goal: Ability to manage health-related needs will improve Outcome: Progressing   Problem: Clinical Measurements: Goal: Ability to maintain clinical measurements within normal limits will improve Outcome: Progressing Goal: Will remain free from infection Outcome: Progressing Goal: Diagnostic test results will improve Outcome: Progressing Goal: Respiratory complications will improve Outcome: Progressing Goal: Cardiovascular complication will be avoided Outcome: Progressing   Problem: Activity: Goal: Risk for activity intolerance will decrease Outcome: Progressing   Problem: Nutrition: Goal: Adequate nutrition will be maintained Outcome: Progressing   Problem: Coping: Goal: Level of anxiety will decrease Outcome: Progressing   Problem: Elimination: Goal: Will not experience complications related to bowel motility Outcome: Progressing Goal: Will not experience complications related to  urinary retention Outcome: Progressing   Problem: Pain Managment: Goal: General experience of comfort will improve Outcome: Progressing   Problem: Safety: Goal: Ability to remain free from injury will improve Outcome: Progressing   Problem: Skin Integrity: Goal: Risk for impaired skin integrity will decrease Outcome: Progressing   Problem: Activity: Goal: Will verbalize the importance of balancing activity with adequate rest periods Outcome: Progressing   Problem: Education: Goal: Will be free of psychotic symptoms Outcome: Progressing Goal: Knowledge of the prescribed therapeutic regimen will improve Outcome: Progressing   Problem: Coping: Goal: Coping ability will improve Outcome: Progressing Goal: Will verbalize feelings Outcome: Progressing   Problem: Health Behavior/Discharge Planning: Goal: Compliance with prescribed medication regimen will improve Outcome: Progressing   Problem: Nutritional: Goal: Ability to achieve adequate nutritional intake will improve Outcome: Progressing   Problem: Role Relationship: Goal: Ability to communicate needs accurately will improve Outcome: Progressing Goal: Ability to interact with others will improve Outcome: Progressing   Problem: Safety: Goal: Ability to redirect hostility and anger into socially appropriate behaviors will improve Outcome: Progressing Goal: Ability to remain free from injury will improve Outcome: Progressing   Problem: Self-Care: Goal: Ability to participate in self-care as condition permits will improve Outcome: Progressing   Problem: Self-Concept: Goal: Will verbalize positive feelings about self Outcome: Progressing   Problem: Education: Goal: Knowledge of [place] [facility] Education information/materials will improve Outcome: Progressing Goal: Emotional status will improve Outcome: Progressing Goal: Mental status will improve Outcome: Progressing Goal: Verbalization of understanding  the information provided will improve Outcome: Progressing   Problem: Activity: Goal: Interest or engagement in activities will improve Outcome: Progressing Goal: Sleeping patterns will improve Outcome: Progressing   Problem: Coping: Goal: Ability to verbalize frustrations and anger appropriately will improve Outcome: Progressing Goal: Ability to demonstrate self-control will improve Outcome: Progressing   Problem: Health Behavior/Discharge Planning: Goal: Identification of resources available to assist in meeting health care needs will improve  Outcome: Progressing Goal: Compliance with treatment plan for underlying cause of condition will improve Outcome: Progressing   Problem: Physical Regulation: Goal: Ability to maintain clinical measurements within normal limits will improve Outcome: Progressing   Problem: Safety: Goal: Periods of time without injury will increase Outcome: Progressing

## 2022-08-06 NOTE — Progress Notes (Signed)
Quiet and reserved. No issues or concerns. She is med complaint. More social with her peers, but remains mostly isolative to herself. Denies si hi avh depression and anxiety.   C Butler-Nicholson, LPN

## 2022-08-07 NOTE — BHH Suicide Risk Assessment (Signed)
[facility] Discharge Suicide Risk Assessment   Principal Problem: Schizoaffective disorder, depressive type [facility]) Discharge Diagnoses: Principal Problem:   Schizoaffective disorder, depressive type ([place])   Total Time spent with patient: 30 minutes  Musculoskeletal: Strength & Muscle Tone: within normal limits Gait & Station: normal Patient leans: [name]  Psychiatric Specialty Exam  Presentation  General Appearance:  Disheveled  Eye Contact: Fair  Speech: Pressured  Speech Volume: Increased  Handedness: Right   Mood and Affect  Mood: Irritable; Labile; Angry  Duration of Depression Symptoms: Greater than two weeks  Affect: Blunt; Labile   Thought Process  Thought Processes: Disorganized; Irrevelant  Descriptions of Associations:Loose  Orientation:Partial  Thought Content:Illogical; Rumination; Scattered; Tangential; Delusions  History of Schizophrenia/Schizoaffective disorder:Yes  Duration of Psychotic Symptoms:Greater than six months  Hallucinations:No data recorded Ideas of Reference:Percusatory; Paranoia  Suicidal Thoughts:No data recorded Homicidal Thoughts:No data recorded  Sensorium  Memory: Immediate Fair; Recent Fair; Remote Fair  Judgment: Impaired  Insight: Poor; Lacking   Executive Functions  Concentration: Poor  Attention Span: Poor  Recall: Poor  Fund of Knowledge: Poor  Language: Poor   Psychomotor Activity  Psychomotor Activity:No data recorded  Assets  Assets: Physical Health; Resilience; Housing; Social Support   Sleep  Sleep:No data recorded  Physical Exam: Physical Exam Vitals and nursing note reviewed.  Constitutional:      Appearance: Normal appearance.  HENT:     Head: Normocephalic and atraumatic.     Mouth/Throat:     Pharynx: Oropharynx is clear.  Eyes:     Pupils: Pupils are equal, round, and reactive to light.  Cardiovascular:     Rate and Rhythm: Normal rate and regular rhythm.   Pulmonary:     Effort: Pulmonary effort is normal.     Breath sounds: Normal breath sounds.  Abdominal:     General: Abdomen is flat.     Palpations: Abdomen is soft.  Musculoskeletal:        General: Normal range of motion.  Skin:    General: Skin is warm and dry.  Neurological:     General: No focal deficit present.     Mental Status: She is alert. Mental status is at baseline.  Psychiatric:        Attention and Perception: She is inattentive.        Mood and Affect: Mood normal. Affect is blunt.        Speech: She is noncommunicative.    Review of Systems  Constitutional: Negative.   HENT: Negative.    Eyes: Negative.   Respiratory: Negative.    Cardiovascular: Negative.   Gastrointestinal: Negative.   Musculoskeletal: Negative.   Skin: Negative.   Neurological: Negative.   Psychiatric/Behavioral: Negative.     Blood pressure 107/69, pulse 74, temperature 98.5 F (36.9 C), temperature source Oral, resp. rate 18, height 5\' 4"  (1.626 m), weight 40.4 kg, SpO2 97 %. Body mass index is 15.28 kg/m.  Mental Status Per Nursing Assessment::   On Admission:  NA  Demographic Factors:  Unemployed  Loss Factors: Financial problems/change in socioeconomic status  Historical Factors: NA  Risk Reduction Factors:     Continued Clinical Symptoms:  Schizophrenia:   Paranoid or undifferentiated type  Cognitive Features That Contribute To Risk:  Loss of executive function    Suicide Risk:  Minimal: No identifiable suicidal ideation.  Patients presenting with no risk factors but with morbid ruminations; may be classified as minimal risk based on the severity of the depressive symptoms   Follow-up  Information     [facility] Follow up.   Contact information: [place] [address] [place] Follow up.   Why: Referral for ACT has been sent, please follow up with this provider to assess  for availability. Contact information: [place] [address] [phone]         [facility] .   Contact information: [place]  [place] [address] [phone]         [facility] Follow up.   Why: Referral for ACT has been sent, please follow up with this provider to assess for availability. Contact information: [place] [address] [phone]                 Plan Of Care/Follow-up recommendations:  Other:  Patient appears to be stable and has not displayed any dangerous behavior.  She has been compliant with medicine.  Patient has been ready for discharge for quite a while.  We will be discharging her today to follow-up with her family.  Does not show signs of any acute dangerousness to self at this time.  Alethia Berthold, MD 08/07/2022, 10:19 AM

## 2022-08-07 NOTE — Progress Notes (Signed)
Discharge Note:  Patient denies SI/HI/AVH at this time. Discharge instructions, AVS, prescriptions, medication supply and transition record gone over with patient. Patient agrees to comply with medication management, follow-up visit, and outpatient therapy. Patient belongings returned to patient. Patient questions and concerns addressed and answered. Patient ambulatory off unit. Patient discharged to home with her Sister.

## 2022-08-07 NOTE — Progress Notes (Signed)
  [facility] Adult Case Management Discharge Plan :  Will you be returning to the same living situation after discharge:  Yes,  pt returning to her home. At discharge, do you have transportation home?: Yes,  Guardian will provide transportation.  Do you have the ability to pay for your medications: Yes,  MEDICARE / MEDICARE PART A AND B  Release of information consent forms completed and in the chart;  Patient's signature needed at discharge.  Patient to Follow up at:  Follow-up Information     [facility] Follow up.   Why: Referral for ACT has been sent, please follow up with this provider to assess for availability. Contact information: [place] [address] [place] Follow up.   Why: Referral for ACT has been sent, please follow up with this provider to assess for availability. Contact information: [place] [address] [phone]         [facility]   Why: Referral for ACT has been sent, please follow up with this provider to assess for availability. Contact information: [place] [address] [phone]         [facility] Follow up.   Why: Referral for ACT has been sent, please follow up with this provider to assess for availability. Contact information: [place] [address] [phone]         [facility] Follow up.   Why: Appointment is scheudled for February 8th 3:00PM.  Appoitnment is virtual. Medication appointment is Februar 23rd at 2:30PM. Contact information: [place] [address] [phone]                 Next level of care provider has access to [place] and Suicide Prevention discussed: Yes,  SPE completed with the patient.     Has patient been referred to the Quitline?:  Patient refused referral  Patient has been referred for addiction treatment: Pt. refused referral  Rozann Lesches, LCSW 08/07/2022, 10:58 AM

## 2022-08-07 NOTE — Plan of Care (Signed)
  Problem: Coping: ?Goal: Level of anxiety will decrease ?Outcome: Progressing ?  ?Problem: Safety: ?Goal: Ability to remain free from injury will improve ?Outcome: Progressing ?  ?

## 2022-08-07 NOTE — Progress Notes (Signed)
Uneventful night. Remains isolative to self and room. Voiced no complaints or concerns. Denies SI, HI, AVH. Goal is to go hom. Medication compliant. No interaction with peers. Minimal interaction with staff. Encouragement and support provided. Safety checks maintained. Medications given as prescribed. Pt receptive and remains safe on unit with q 15 min checks.

## 2022-08-07 NOTE — BHH Counselor (Signed)
CSW met with psychiatrist to discuss concerns for the patient in returning home.  No concerns identified.  No safety concerns identified.   Assunta Curtis, MSW, LCSW 08/07/2022 12:56 PM

## 2022-08-07 NOTE — Progress Notes (Signed)
   08/07/22 1300  Patient and [place] Returned  Patient is satisfied that all belongings have been returned? Yes  Name of person receiving valuables? [name] valuables returned see belongings sheet

## 2022-08-07 NOTE — BHH Counselor (Addendum)
CSW received call back from [employer] office.  They report that they will call to follow up with guardian in the next week.  Assunta Curtis, MSW, LCSW 08/07/2022 1:07 PM     CSW followed up on following ACTT referrals  [facility]--HIPAA compliant voicemail [facility]--scheduled intake assessment [facility]-left HIPAA compliant voicemail  Assunta Curtis, MSW, LCSW 08/07/2022 12:05 PM

## 2022-08-07 NOTE — Discharge Summary (Signed)
Physician Discharge Summary Note  Patient:  [name] is an 62 y.o., female MRN:  [id number] DOB:  September 08, 1960 Patient phone:  [phone] (home)  Patient address:   [place] [address],  Total Time spent with patient: 30 minutes  Date of Admission:  04/08/2022 Date of Discharge: 08/07/2022  Reason for Admission: Patient was admitted for treatment of psychotic symptoms and poor functioning at home.  Paranoid behavior was evident and there was concern that the patient was not eating enough to sustain health.  Admitted to psychiatric unit for treatment and stabilization  Principal Problem: Schizoaffective disorder, depressive type [facility]) Discharge Diagnoses: Principal Problem:   Schizoaffective disorder, depressive type ([place])   Past Psychiatric History: Long history of chronic psychotic disorder either schizoaffective or schizophrenia.  Previously had responded to medication but seems to have had a decline in recent years.  History of poor oral intake when more psychotic.  Past Medical History:  Past Medical History:  Diagnosis Date   Anemia 10/02/2021   Non compliance w medication regimen    Schizophrenia [facility])     Past Surgical History:  Procedure Laterality Date   BIOPSY  09/25/2021   Procedure: BIOPSY;  Surgeon: Milus Banister, MD;  Location: [facility];  Service: Gastroenterology;;   ESOPHAGOGASTRODUODENOSCOPY (EGD) WITH PROPOFOL [name] 09/25/2021   Procedure: ESOPHAGOGASTRODUODENOSCOPY (EGD) WITH PROPOFOL;  Surgeon: Milus Banister, MD;  Location: [facility];  Service: Gastroenterology;  Laterality: [name];  With NGT placement   Family History: History reviewed. No pertinent family history. Family Psychiatric  History: See previous Social History:  Social History   Substance and Sexual Activity  Alcohol Use Not Currently   Comment: Refuses to disclose how much     Social History   Substance and Sexual Activity  Drug Use No   Comment: Refuses  to answer    Social History   Socioeconomic History   Marital status: Single    Spouse name: Not on file   Number of children: Not on file   Years of education: Not on file   Highest education level: Not on file  Occupational History   Not on file  Tobacco Use   Smoking status: Every Day    Packs/day: 2.00    Types: Cigarettes   Smokeless tobacco: Never  Substance and Sexual Activity   Alcohol use: Not Currently    Comment: Refuses to disclose how much   Drug use: No    Comment: Refuses to answer   Sexual activity: Not Currently    Comment: refused to answer  Other Topics Concern   Not on file  Social History Narrative   Not on file   Social Determinants of Health   Financial Resource Strain: Not on file  Food Insecurity: No Food Insecurity (04/08/2022)   Hunger Vital Sign    Worried About Running Out of Food in the Last Year: Never true    Ran Out of Food in the Last Year: Never true  Transportation Needs: No Transportation Needs (04/08/2022)   PRAPARE - Hydrologist (Medical): No    Lack of Transportation (Non-Medical): No  Physical Activity: Not on file  Stress: Not on file  Social Connections: Not on file    Hospital Course: Patient was admitted to the psychiatric unit.  She was admitted to the adult unit rather than geriatric despite her age because she appeared to be physically stable and able to program better here.  Patient was treated  with antipsychotic medication and engaged in assessment in group and individual counseling.  Patient began eating appropriately and has been eating well sustaining her weight throughout the remainder of her hospital stay.  Her psychotic symptoms have been persistent in part.  Despite trials of more than 1 antipsychotic she has continued to have paranoia specifically about her family.  She has not however been violent or aggressive on the unit and is not expressing any violent ideas about her family or  anyone else.  No sign of self-harm.  Patient has been compliant with medicine and appears to be medically stable.  We have been seeking discharge for many weeks and family has been resistant but they are her legal guardian and at this point we are going to discharge her with the expectation that they will take care of her.  Prescriptions provided as well as medication and follow-up appointments arranged.  Physical Findings: AIMS: Facial and Oral Movements Muscles of Facial Expression: None, normal Lips and Perioral Area: None, normal Jaw: None, normal Tongue: None, normal,Extremity Movements Upper (arms, wrists, hands, fingers): None, normal Lower (legs, knees, ankles, toes): None, normal, Trunk Movements Neck, shoulders, hips: None, normal, Overall Severity Severity of abnormal movements (highest score from questions above): None, normal Incapacitation due to abnormal movements: None, normal Patient's awareness of abnormal movements (rate only patient's report): No Awareness, Dental Status Current problems with teeth and/or dentures?: No Does patient usually wear dentures?: No  CIWA:    COWS:     Musculoskeletal: Strength & Muscle Tone: within normal limits Gait & Station: normal Patient leans: [name]   Psychiatric Specialty Exam:  Presentation  General Appearance:  Disheveled  Eye Contact: Fair  Speech: Pressured  Speech Volume: Increased  Handedness: Right   Mood and Affect  Mood: Irritable; Labile; Angry  Affect: Blunt; Labile   Thought Process  Thought Processes: Disorganized; Irrevelant  Descriptions of Associations:Loose  Orientation:Partial  Thought Content:Illogical; Rumination; Scattered; Tangential; Delusions  History of Schizophrenia/Schizoaffective disorder:Yes  Duration of Psychotic Symptoms:Greater than six months  Hallucinations:No data recorded Ideas of Reference:Percusatory; Paranoia  Suicidal Thoughts:No data recorded Homicidal  Thoughts:No data recorded  Sensorium  Memory: Immediate Fair; Recent Fair; Remote Fair  Judgment: Impaired  Insight: Poor; Lacking   Executive Functions  Concentration: Poor  Attention Span: Poor  Recall: Poor  Fund of Knowledge: Poor  Language: Poor   Psychomotor Activity  Psychomotor Activity:No data recorded  Assets  Assets: Physical Health; Resilience; Housing; Social Support   Sleep  Sleep:No data recorded   Physical Exam: Physical Exam Vitals and nursing note reviewed.  Constitutional:      Appearance: Normal appearance.  HENT:     Head: Normocephalic and atraumatic.     Mouth/Throat:     Pharynx: Oropharynx is clear.  Eyes:     Pupils: Pupils are equal, round, and reactive to light.  Cardiovascular:     Rate and Rhythm: Normal rate and regular rhythm.  Pulmonary:     Effort: Pulmonary effort is normal.     Breath sounds: Normal breath sounds.  Abdominal:     General: Abdomen is flat.     Palpations: Abdomen is soft.  Musculoskeletal:        General: Normal range of motion.  Skin:    General: Skin is warm and dry.  Neurological:     General: No focal deficit present.     Mental Status: She is alert. Mental status is at baseline.  Psychiatric:  Attention and Perception: She is inattentive.        Mood and Affect: Mood normal. Affect is blunt.        Speech: She is noncommunicative.        Thought Content: Thought content is paranoid.    Review of Systems  Constitutional: Negative.   HENT: Negative.    Eyes: Negative.   Respiratory: Negative.    Cardiovascular: Negative.   Gastrointestinal: Negative.   Musculoskeletal: Negative.   Skin: Negative.   Neurological: Negative.   Psychiatric/Behavioral: Negative.     Blood pressure 107/69, pulse 74, temperature 98.5 F (36.9 C), temperature source Oral, resp. rate 18, height 5\' 4"  (1.626 m), weight 40.4 kg, SpO2 97 %. Body mass index is 15.28 kg/m.   Social History    Tobacco Use  Smoking Status Every Day   Packs/day: 2.00   Types: Cigarettes  Smokeless Tobacco Never   Tobacco Cessation:  A prescription for an FDA-approved tobacco cessation medication was offered at discharge and the patient refused   Blood Alcohol level:  Lab Results  Component Value Date   [facility] <10 03/29/2022   ETH <10 16/96/7893    Metabolic Disorder Labs:  Lab Results  Component Value Date   HGBA1C 5.6 10/21/2021   MPG 114 10/21/2021   MPG 111 10/02/2021   Lab Results  Component Value Date   PROLACTIN 13.6 04/16/2015   Lab Results  Component Value Date   CHOL 189 10/21/2021   TRIG 52 10/21/2021   HDL 50 10/21/2021   CHOLHDL 3.8 10/21/2021   VLDL 10 10/21/2021   LDLCALC 129 (H) 10/21/2021   [place] 93 10/01/2020    See Psychiatric Specialty Exam and Suicide Risk Assessment completed by Attending Physician prior to discharge.  Discharge destination:  Home  Is patient on multiple antipsychotic therapies at discharge:  No   Has Patient had three or more failed trials of antipsychotic monotherapy by history:  Yes,   Antipsychotic medications that previously failed include:   1.  Invega., 2.  Olanzapine., and 3.  Seroquel.  Recommended Plan for Multiple Antipsychotic Therapies: Additional reason(s) for multiple antispychotic treatment:  Patient is currently on more than 1 antipsychotic and still continues to have psychotic symptoms but shows better response on more than one agent.  No reason to taper or discontinue medicines at this point.  Discharge Instructions     Diet - low sodium heart healthy   Complete by: As directed    Increase activity slowly   Complete by: As directed       Allergies as of 08/07/2022       Reactions   Aspirin Hives, Other (See Comments)   Possibly caused hives, per sister   Ziprasidone Hcl Rash, Other (See Comments)   Caused twitching. Redness of eyes.         Medication List     STOP taking these medications     clonazePAM 0.5 MG tablet Commonly known as: KlonoPIN   midodrine 2.5 MG tablet Commonly known as: PROAMATINE       TAKE these medications      Indication  haloperidol 5 MG tablet Commonly known as: HALDOL Take 1 tablet (5 mg total) by mouth 2 (two) times daily.  Indication: Psychosis   lithium carbonate 300 MG capsule Take 2 capsules (600 mg total) by mouth at bedtime.  Indication: Schizoaffective Disorder   mirtazapine 30 MG tablet Commonly known as: REMERON Take 1 tablet (30 mg total) by mouth at bedtime.  What changed:  medication strength how much to take  Indication: Major Depressive Disorder   multivitamin with minerals Tabs tablet Take 1 tablet by mouth daily.  Indication: Nutrition   nicotine 7 mg/24hr patch Commonly known as: NICODERM CQ - dosed in mg/24 hr Place 1 patch (7 mg total) onto the skin daily. (Remove old patch before applying new patch).  Indication: Nicotine Addiction   OLANZapine 15 MG tablet Commonly known as: ZYPREXA Take 2 tablets (30 mg total) by mouth at bedtime. What changed:  medication strength how much to take  Indication: Schizophrenia   QUEtiapine 400 MG tablet Commonly known as: SEROQUEL Take 1 tablet (400 mg total) by mouth at bedtime.  Indication: Schizophrenia   thiamine 100 MG tablet Commonly known as: VITAMIN B1 Take 1 tablet (100 mg total) by mouth daily.  Indication: Deficiency of Vitamin B1        Follow-up Information     [facility] Follow up.   Contact information: [place] [address] [place] Follow up.   Why: Referral for ACT has been sent, please follow up with this provider to assess for availability. Contact information: [place] [address] [phone]         [facility] .   Contact information: [place]  [place] [address] [phone]         [facility] Follow up.   Why: Referral for ACT has been sent, please follow up with this provider to assess for availability. Contact information: [place] [address] [phone]                 Follow-up recommendations:  Other:  Prescriptions and a supply of medicine available as well as follow-up arrangements and appointments provided  Comments: See above  Signed: Alethia Berthold, MD 08/07/2022, 10:21 AM

## 2022-08-07 NOTE — Care Management Important Message (Addendum)
Important Message  Patient Details  Name: [name] MRN: [id number] Date of Birth: 01-18-1961   Medicare Important Message Given:  Yes  CSW reviewed with the patient's guardian.  Guardian reports no plans to appeal discharge at this time.   Rozann Lesches, LCSW 08/07/2022, 10:25 AM

## 2022-08-07 NOTE — Plan of Care (Signed)
Problem: Education: Goal: Knowledge of General Education information will improve Description: Including pain rating scale, medication(s)/side effects and non-pharmacologic comfort measures Outcome: Adequate for Discharge   Problem: Health Behavior/Discharge Planning: Goal: Ability to manage health-related needs will improve Outcome: Adequate for Discharge   Problem: Clinical Measurements: Goal: Ability to maintain clinical measurements within normal limits will improve Outcome: Adequate for Discharge Goal: Will remain free from infection Outcome: Adequate for Discharge Goal: Diagnostic test results will improve Outcome: Adequate for Discharge Goal: Respiratory complications will improve Outcome: Adequate for Discharge Goal: Cardiovascular complication will be avoided Outcome: Adequate for Discharge   Problem: Activity: Goal: Risk for activity intolerance will decrease Outcome: Adequate for Discharge   Problem: Nutrition: Goal: Adequate nutrition will be maintained Outcome: Adequate for Discharge   Problem: Coping: Goal: Level of anxiety will decrease Outcome: Adequate for Discharge   Problem: Elimination: Goal: Will not experience complications related to bowel motility Outcome: Adequate for Discharge Goal: Will not experience complications related to urinary retention Outcome: Adequate for Discharge   Problem: Pain Managment: Goal: General experience of comfort will improve Outcome: Adequate for Discharge   Problem: Safety: Goal: Ability to remain free from injury will improve Outcome: Adequate for Discharge   Problem: Skin Integrity: Goal: Risk for impaired skin integrity will decrease Outcome: Adequate for Discharge   Problem: Activity: Goal: Will verbalize the importance of balancing activity with adequate rest periods Outcome: Adequate for Discharge   Problem: Education: Goal: Will be free of psychotic symptoms Outcome: Adequate for Discharge Goal:  Knowledge of the prescribed therapeutic regimen will improve Outcome: Adequate for Discharge   Problem: Coping: Goal: Coping ability will improve Outcome: Adequate for Discharge Goal: Will verbalize feelings Outcome: Adequate for Discharge   Problem: Health Behavior/Discharge Planning: Goal: Compliance with prescribed medication regimen will improve Outcome: Adequate for Discharge   Problem: Nutritional: Goal: Ability to achieve adequate nutritional intake will improve Outcome: Adequate for Discharge   Problem: Role Relationship: Goal: Ability to communicate needs accurately will improve Outcome: Adequate for Discharge Goal: Ability to interact with others will improve Outcome: Adequate for Discharge   Problem: Safety: Goal: Ability to redirect hostility and anger into socially appropriate behaviors will improve Outcome: Adequate for Discharge Goal: Ability to remain free from injury will improve Outcome: Adequate for Discharge   Problem: Self-Care: Goal: Ability to participate in self-care as condition permits will improve Outcome: Adequate for Discharge   Problem: Self-Concept: Goal: Will verbalize positive feelings about self Outcome: Adequate for Discharge   Problem: Education: Goal: Knowledge of [place] General Education information/materials will improve Outcome: Adequate for Discharge Goal: Emotional status will improve Outcome: Adequate for Discharge Goal: Mental status will improve Outcome: Adequate for Discharge Goal: Verbalization of understanding the information provided will improve Outcome: Adequate for Discharge   Problem: Activity: Goal: Interest or engagement in activities will improve Outcome: Adequate for Discharge Goal: Sleeping patterns will improve Outcome: Adequate for Discharge   Problem: Coping: Goal: Ability to verbalize frustrations and anger appropriately will improve Outcome: Adequate for Discharge Goal: Ability to demonstrate  self-control will improve Outcome: Adequate for Discharge   Problem: Health Behavior/Discharge Planning: Goal: Identification of resources available to assist in meeting health care needs will improve Outcome: Adequate for Discharge Goal: Compliance with treatment plan for underlying cause of condition will improve Outcome: Adequate for Discharge   Problem: Physical Regulation: Goal: Ability to maintain clinical measurements within normal limits will improve Outcome: Adequate for Discharge   Problem: Safety: Goal: Periods of time without   injury will increase Outcome: Adequate for Discharge

## 2022-10-01 ENCOUNTER — Other Ambulatory Visit: Payer: Self-pay

## 2023-01-08 ENCOUNTER — Inpatient Hospital Stay (HOSPITAL_COMMUNITY)
Admission: EM | Admit: 2023-01-08 | Discharge: 2023-01-22 | DRG: 885 | Disposition: A | Discharge status: Home Health | Payer: Medicare Other | Attending: Family Medicine | Admitting: Family Medicine

## 2023-01-08 ENCOUNTER — Encounter (HOSPITAL_COMMUNITY): Payer: Self-pay

## 2023-01-08 ENCOUNTER — Other Ambulatory Visit: Payer: Self-pay

## 2023-01-08 DIAGNOSIS — Z79899 Other long term (current) drug therapy: Secondary | ICD-10-CM

## 2023-01-08 DIAGNOSIS — Z888 Allergy status to other drugs, medicaments and biological substances status: Secondary | ICD-10-CM

## 2023-01-08 DIAGNOSIS — E876 Hypokalemia: Secondary | ICD-10-CM | POA: Diagnosis present

## 2023-01-08 DIAGNOSIS — N179 Acute kidney failure, unspecified: Secondary | ICD-10-CM

## 2023-01-08 DIAGNOSIS — R627 Adult failure to thrive: Secondary | ICD-10-CM | POA: Diagnosis present

## 2023-01-08 DIAGNOSIS — R001 Bradycardia, unspecified: Secondary | ICD-10-CM | POA: Diagnosis not present

## 2023-01-08 DIAGNOSIS — Z1152 Encounter for screening for COVID-19: Secondary | ICD-10-CM

## 2023-01-08 DIAGNOSIS — I959 Hypotension, unspecified: Secondary | ICD-10-CM | POA: Diagnosis not present

## 2023-01-08 DIAGNOSIS — Z751 Person awaiting admission to adequate facility elsewhere: Secondary | ICD-10-CM

## 2023-01-08 DIAGNOSIS — F061 Catatonic disorder due to known physiological condition: Secondary | ICD-10-CM | POA: Diagnosis not present

## 2023-01-08 DIAGNOSIS — R64 Cachexia: Secondary | ICD-10-CM | POA: Diagnosis present

## 2023-01-08 DIAGNOSIS — F200 Paranoid schizophrenia: Secondary | ICD-10-CM | POA: Diagnosis not present

## 2023-01-08 DIAGNOSIS — Z66 Do not resuscitate: Secondary | ICD-10-CM | POA: Diagnosis not present

## 2023-01-08 DIAGNOSIS — Z91148 Patient's other noncompliance with medication regimen for other reason: Secondary | ICD-10-CM

## 2023-01-08 DIAGNOSIS — F17210 Nicotine dependence, cigarettes, uncomplicated: Secondary | ICD-10-CM | POA: Diagnosis present

## 2023-01-08 DIAGNOSIS — R9431 Abnormal electrocardiogram [ECG] [EKG]: Secondary | ICD-10-CM

## 2023-01-08 DIAGNOSIS — F29 Unspecified psychosis not due to a substance or known physiological condition: Secondary | ICD-10-CM | POA: Diagnosis present

## 2023-01-08 DIAGNOSIS — Z5329 Procedure and treatment not carried out because of patient's decision for other reasons: Secondary | ICD-10-CM | POA: Diagnosis not present

## 2023-01-08 DIAGNOSIS — Z681 Body mass index (BMI) 19 or less, adult: Secondary | ICD-10-CM

## 2023-01-08 DIAGNOSIS — D72819 Decreased white blood cell count, unspecified: Secondary | ICD-10-CM | POA: Diagnosis present

## 2023-01-08 DIAGNOSIS — E872 Acidosis, unspecified: Secondary | ICD-10-CM | POA: Diagnosis present

## 2023-01-08 DIAGNOSIS — Z886 Allergy status to analgesic agent status: Secondary | ICD-10-CM

## 2023-01-08 LAB — CBC WITH DIFFERENTIAL/PLATELET
Abs Immature Granulocytes: 0.01 10*3/uL (ref 0.00–0.07)
Basophils Absolute: 0 10*3/uL (ref 0.0–0.1)
Basophils Relative: 0 %
Eosinophils Absolute: 0 10*3/uL (ref 0.0–0.5)
Eosinophils Relative: 0 %
HCT: 40 % (ref 36.0–46.0)
Hemoglobin: 12.7 g/dL (ref 12.0–15.0)
Immature Granulocytes: 0 %
Lymphocytes Relative: 17 %
Lymphs Abs: 0.9 10*3/uL (ref 0.7–4.0)
MCH: 27.4 pg (ref 26.0–34.0)
MCHC: 31.8 g/dL (ref 30.0–36.0)
MCV: 86.4 fL (ref 80.0–100.0)
Monocytes Absolute: 0.2 10*3/uL (ref 0.1–1.0)
Monocytes Relative: 5 %
Neutro Abs: 4 10*3/uL (ref 1.7–7.7)
Neutrophils Relative %: 78 %
Platelets: 207 10*3/uL (ref 150–400)
RBC: 4.63 MIL/uL (ref 3.87–5.11)
RDW: 14.8 % (ref 11.5–15.5)
WBC: 5.1 10*3/uL (ref 4.0–10.5)
nRBC: 0 % (ref 0.0–0.2)

## 2023-01-08 LAB — COMPREHENSIVE METABOLIC PANEL
ALT: 17 U/L (ref 0–44)
AST: 37 U/L (ref 15–41)
Albumin: 4.3 g/dL (ref 3.5–5.0)
Alkaline Phosphatase: 51 U/L (ref 38–126)
Anion gap: 10 (ref 5–15)
BUN: 17 mg/dL (ref 8–23)
CO2: 29 mmol/L (ref 22–32)
Calcium: 8.6 mg/dL — ABNORMAL LOW (ref 8.9–10.3)
Chloride: 100 mmol/L (ref 98–111)
Creatinine, Ser: 1.17 mg/dL — ABNORMAL HIGH (ref 0.44–1.00)
GFR, Estimated: 53 mL/min — ABNORMAL LOW (ref >60)
Glucose, Bld: 92 mg/dL (ref 70–99)
Potassium: 2.3 mmol/L — CL (ref 3.5–5.1)
Sodium: 139 mmol/L (ref 135–145)
Total Bilirubin: 0.6 mg/dL (ref 0.3–1.2)
Total Protein: 7.6 g/dL (ref 6.5–8.1)

## 2023-01-08 LAB — ETHANOL: Alcohol, Ethyl (B): <10 mg/dL (ref <10)

## 2023-01-08 LAB — ACETAMINOPHEN LEVEL: Acetaminophen (Tylenol), Serum: <10 ug/mL — ABNORMAL LOW (ref 10–30)

## 2023-01-08 LAB — CK: Total CK: 235 U/L — ABNORMAL HIGH (ref 38–234)

## 2023-01-08 LAB — SALICYLATE LEVEL: Salicylate Lvl: <7 mg/dL — ABNORMAL LOW (ref 7.0–30.0)

## 2023-01-08 LAB — MAGNESIUM: Magnesium: 2.5 mg/dL — ABNORMAL HIGH (ref 1.7–2.4)

## 2023-01-08 MED ORDER — KETAMINE HCL 50 MG/ML IJ SOLN
100.0000 mg | Freq: Once | INTRAMUSCULAR | Status: AC
  Administered 2023-01-08: 100 mg via INTRAMUSCULAR
  Filled 2023-01-08: qty 10

## 2023-01-08 MED ORDER — LORAZEPAM 2 MG/ML IJ SOLN
2.0000 mg | Freq: Once | INTRAMUSCULAR | Status: AC
  Administered 2023-01-08: 2 mg via INTRAMUSCULAR
  Filled 2023-01-08: qty 1

## 2023-01-08 MED ORDER — DIPHENHYDRAMINE HCL 50 MG/ML IJ SOLN
50.0000 mg | Freq: Once | INTRAMUSCULAR | Status: AC
  Administered 2023-01-08: 50 mg via INTRAMUSCULAR
  Filled 2023-01-08: qty 1

## 2023-01-08 MED ORDER — LACTATED RINGERS IV BOLUS
1000.0000 mL | Freq: Once | INTRAVENOUS | Status: AC
  Administered 2023-01-08: 1000 mL via INTRAVENOUS

## 2023-01-08 MED ORDER — POTASSIUM CHLORIDE 10 MEQ/100ML IV SOLN
10.0000 meq | INTRAVENOUS | Status: AC
  Administered 2023-01-08 – 2023-01-09 (×5): 10 meq via INTRAVENOUS
  Filled 2023-01-08 (×5): qty 100

## 2023-01-08 MED ORDER — POTASSIUM CHLORIDE CRYS ER 20 MEQ PO TBCR
40.0000 meq | EXTENDED_RELEASE_TABLET | Freq: Once | ORAL | Status: DC
  Filled 2023-01-08: qty 2
  Filled 2023-01-08: qty 4

## 2023-01-08 NOTE — ED Provider Notes (Signed)
62 yo female here with [employer], +IVC, living at home won't turn the air on bc its poisoned, won't take anything PO bc its poisoned. Arrived combative.  Plan for ketamine and restrain, hx of QT prolongation.  EKG IV Kcl and admit Physical Exam  BP 120/79 (BP Location: Left Arm)   Pulse (!) 52   Temp (!) 97.5 F (36.4 C) (Oral)   Resp 16   Ht 5\' 4"  (1.626 m)   Wt 40.4 kg   SpO2 100%   BMI 15.29 kg/m   Physical Exam Mucous membranes dry.  Awake, answers simple questions.  Procedures  .Critical Care  Performed by: Jeannie Fend, PA-C Authorized by: Jeannie Fend, PA-C   Critical care provider statement:    Critical care time (minutes):  30   Critical care was time spent personally by me on the following activities:  Development of treatment plan with patient or surrogate, discussions with consultants, evaluation of patient's response to treatment, examination of patient, ordering and review of laboratory studies, ordering and review of radiographic studies, ordering and performing treatments and interventions, pulse oximetry, re-evaluation of patient's condition and review of old charts   ED Course / MDM    Medical Decision Making Amount and/or Complexity of Data Reviewed Labs: ordered.  Risk Prescription drug management. Decision regarding hospitalization.  Case discussed with Dr. Julian Reil with [facility], unable to admit as the floor cannot continue to dose ketamine.  Recommends continue to monitor patient while in the ER, provide IV K, update on condition.  2:45am patient awake, using bedpan (diarrhea). Refuses PO meds/foods, states she does not need them. Advised patient her potassium is dangerously low which is important as it relates to her heart. She is receiving IV KCL, has had 4 runs. Hospitalist was updated, patient will be seen by the day team.  Continue with IV K.  4:15am resting, continues with NS and KCL, not in restraints.   5:25am resting. Not in  restraints. KCL round 6 in process. Repeat BMP pending.   Repeat BMP with improved K to 3.0. Patient not medically cleared for behavioral health disposition as she refuses to take anything by mouth and will likely be unable to maintain her K. Cr improved with IVF provided.        Jeannie Fend, PA-C 01/09/23 0603    Melene Plan, DO 01/09/23 [phone]

## 2023-01-08 NOTE — ED Notes (Signed)
Patient refused VS, EKG and IV started. P.A. Informed

## 2023-01-08 NOTE — ED Notes (Signed)
Pt refuses vital signs and says it really isnt going to help

## 2023-01-08 NOTE — ED Notes (Signed)
Pt refusing to take PO potassium and allow staff to obtain IV access. States it does not work and she is not taking it. Riley PA notified and to come speak with pt about med importance/compliance

## 2023-01-08 NOTE — ED Notes (Signed)
Patient refusing blood work.

## 2023-01-08 NOTE — ED Notes (Signed)
Refused vitals x2

## 2023-01-08 NOTE — ED Provider Notes (Signed)
[place] EMERGENCY DEPARTMENT AT [facility] Provider Note   CSN: [id number] Arrival date & time: 01/08/23  1659     History Chief Complaint  Patient presents with   IVC    [name] is a 62 y.o. female with history of medical noncompliance, psychoses, paranoid schizophrenia presents emergency room today for evaluation of acute psychosis.  History obtained by the patient's sister and the IVC report.  The sister reports that she has been noncompliant with her medication for at least the past 4 months since her last discharge from hospitalization.  She has not been eating because she is afraid that people are contaminating her food and will only eat bacon.  She has turned off all the air in the house and will not open the windows or down on any ceiling fans.  The sister reports that she was okay with this when it was cooler outside however now that is getting hotter, it is hotter inside the house and is outside in the patient lives with their 35 year old mother.  She is concerned only for the patient's mother safety with a safety of the patient herself given that she is putting herself at risk and is also not being on her medication.  She reports that she has been very agitated and irritated and is afraid of harm to herself and others.  Unfortunately, the sister is unable to tell me the daily medications that she was on.  She does not know of any other medical problems the patient has.  Level 5 caveat due to patient's psychosis.  HPI     Home Medications Prior to Admission medications   Medication Sig Start Date End Date Taking? Authorizing Provider  haloperidol (HALDOL) 5 MG tablet Take 1 tablet (5 mg total) by mouth 2 (two) times daily. 08/06/22   [name], [name], MD  lithium carbonate 300 MG capsule Take 2 capsules (600 mg total) by mouth at bedtime. 08/06/22   [name], [name], MD  mirtazapine (REMERON) 30 MG tablet Take 1 tablet (30 mg total) by mouth at bedtime. 08/06/22    [name], [name], MD  Multiple Vitamin (MULTIVITAMIN WITH MINERALS) TABS tablet Take 1 tablet by mouth daily. 08/06/22   [name], [name], MD  nicotine (NICODERM CQ - DOSED IN MG/24 HR) 7 mg/24hr patch Place 1 patch (7 mg total) onto the skin daily. (Remove old patch before applying new patch). 08/06/22   [name], [name], MD  OLANZapine (ZYPREXA) 15 MG tablet Take 2 tablets (30 mg total) by mouth at bedtime. 08/06/22   [name], [name], MD  QUEtiapine (SEROQUEL) 400 MG tablet Take 1 tablet (400 mg total) by mouth at bedtime. 08/06/22   [name], [name], MD  thiamine (VITAMIN B1) 100 MG tablet Take 1 tablet (100 mg total) by mouth daily. 08/06/22   [name], [name], MD      Allergies    Aspirin and Ziprasidone hcl    Review of Systems   Review of Systems  Unable to perform ROS: Psychiatric disorder    Physical Exam Updated Vital Signs Ht 5\' 4"  (1.626 m)   Wt 40.4 kg   BMI 15.29 kg/m  Physical Exam Vitals and nursing note reviewed.  HENT:     Mouth/Throat:     Comments: Poor dentition Eyes:     General: No scleral icterus. Pulmonary:     Effort: Pulmonary effort is normal. No respiratory distress.  Skin:    General: Skin is dry.  Neurological:  General: No focal deficit present.     Mental Status: She is alert. Mental status is at baseline.     Gait: Gait normal.  Psychiatric:     Comments: Tangential speech and is rapid and no fluid. She is not cooperative and is acting my aggressive towards myself and staff.      ED Results / Procedures / Treatments   Labs (all labs ordered are listed, but only abnormal results are displayed) Labs Reviewed  COMPREHENSIVE METABOLIC PANEL  ETHANOL  RAPID URINE DRUG SCREEN, HOSP PERFORMED  CBC WITH DIFFERENTIAL/PLATELET  URINALYSIS, ROUTINE W REFLEX MICROSCOPIC    EKG None  Radiology No results found.  Procedures Procedures   Medications Ordered in ED Medications  LORazepam (ATIVAN) injection 2 mg (2 mg Intramuscular Given  01/08/23 1812)  diphenhydrAMINE (BENADRYL) injection 50 mg (50 mg Intramuscular Given 01/08/23 1811)    ED Course/ Medical Decision Making/ A&P                           Medical Decision Making Amount and/or Complexity of Data Reviewed Labs: ordered.  Risk Prescription drug management.   62 y.o. female presents to the ER for evaluation of psychosis. Differential diagnosis includes but is not limited to psych versus electrolyte abnormalities. Vital signs show pulse rate of 52, temperature 97.5 otherwise unremarkable. Physical exam as noted above.   IVC paperwork completed and filed by my attending.   The patient would not cooperate with vitals, much less an EKG. Her last EKG showed prolonged QT at 611. Given this, I am limited on the medications she can take. I have ordered Ativan and Benadryl IM.   I independently reviewed and interpreted the patient's labs.  CK level mildly elevated 235 however does not qualify for any rhabdomyolysis.  Acetaminophen is also good and ethanol levels are negative.  Magnesium slightly elevated at 2.5.  CMP shows significant hypokalemia with a potassium of 2.3.  Slightly elevated creatinine at 1.17 from her baseline around 0.7 0.8.  Calcium decreased at 8.6.  No other electrolyte abnormality.  No other LFT abnormality.  CBC without leukocytosis or anemia.  Urinalysis still needs to be collected.  Due to patient's being uncooperative, EKG is difficult to obtain as well as IV access given her significant hypokalemia.  On re-evaluation, patient does appear more calm, but still combative. Discussed with my attending about next steps given that she won't take PO potassium and is combative with trying to do an EKG or IV. Will consult pharmacy  Spoke with Herbert Seta, [facility] given patient's lack of IV access, hypokalemia, as well as last documented EKG showing QT at 611, with still patient being agitated and not allowing for IV potassium or EKG obtainment.  She recommends  ketamine IM.  She reports she was started at 2 mg/kg for given the patient's weight she was started at 100 mg IM.  I have asked charge nurse to put her in a room for cardiac evaluation after ketamine as she is in a hallway bed. We are working on getting her room right now.  Patient is now in room.  100 mg of IM ketamine was given.  Patient now somnolent and relaxed.  Equal rise of chest.  Still remains a little bradycardic.  I do not appreciate any nystagmus.  We were able to start an IV and obtain an EKG.  Patient snoring off-and-on.  She is on telemetry given the ketamine.  I repeated the  BMP for accuracy of the hypokalemia.  At this time, will handoff to oncoming shift to admit to medicine for hypokalemia.  She will need to be medically cleared before seen for her psychosis.  11:11 PM Care of [facility] [place] transferred to [facility] at the end of my shift as the patient will require reassessment once labs/imaging have resulted. Patient presentation, ED course, and plan of care discussed with review of all pertinent labs and imaging. Please see his/her note for further details regarding further ED course and disposition. Plan at time of handoff is follow up with EKG and admit to medicine. This may be altered or completely changed at the discretion of the oncoming team pending results of further workup.  Portions of this report may have been transcribed using voice recognition software. Every effort was made to ensure accuracy; however, inadvertent computerized transcription errors may be present.   Final Clinical Impression(s) / ED Diagnoses Final diagnoses:  Hypokalemia  AKI (acute kidney injury) (HCC)  Psychosis, unspecified psychosis type [facility])    Rx / DC Orders ED Discharge Orders     None         Achille Rich, PA-C 01/08/23 2313    Rondel Baton, MD 01/09/23 1414

## 2023-01-08 NOTE — ED Notes (Signed)
1 belonging bag

## 2023-01-08 NOTE — ED Notes (Signed)
Pt states "NO No No it anit going to help" for the EKG.

## 2023-01-08 NOTE — ED Notes (Signed)
Refused to put on scrubs

## 2023-01-08 NOTE — ED Notes (Signed)
Able to obtain VS, pt refusing EKG at this time. States she does not have any heart problems, doesn't eat pork and it is not needed

## 2023-01-08 NOTE — ED Triage Notes (Signed)
Patient brought in by police due to patient being IVC'ed. Patient has not been eating or sleeping. Has a history of schizophrenia. Pt believes someone is tampering with her food and she shut off the air conditioner at her house because she thinks the air from outside is infecting her and her 62 year old mother. Pt is hyperverbal and non-compliant with medications. Pt is speaking to unknown source at this time.

## 2023-01-09 ENCOUNTER — Encounter (HOSPITAL_COMMUNITY): Payer: Self-pay

## 2023-01-09 DIAGNOSIS — F200 Paranoid schizophrenia: Secondary | ICD-10-CM | POA: Diagnosis not present

## 2023-01-09 DIAGNOSIS — E876 Hypokalemia: Secondary | ICD-10-CM | POA: Diagnosis not present

## 2023-01-09 LAB — BASIC METABOLIC PANEL
Anion gap: 8 (ref 5–15)
Anion gap: 12 (ref 5–15)
Anion gap: 10 (ref 5–15)
BUN: 14 mg/dL (ref 8–23)
BUN: 11 mg/dL (ref 8–23)
BUN: 7 mg/dL — ABNORMAL LOW (ref 8–23)
CO2: 29 mmol/L (ref 22–32)
CO2: 25 mmol/L (ref 22–32)
CO2: 28 mmol/L (ref 22–32)
Calcium: 8.2 mg/dL — ABNORMAL LOW (ref 8.9–10.3)
Calcium: 8.6 mg/dL — ABNORMAL LOW (ref 8.9–10.3)
Calcium: 8.6 mg/dL — ABNORMAL LOW (ref 8.9–10.3)
Chloride: 101 mmol/L (ref 98–111)
Chloride: 100 mmol/L (ref 98–111)
Chloride: 96 mmol/L — ABNORMAL LOW (ref 98–111)
Creatinine, Ser: 0.81 mg/dL (ref 0.44–1.00)
Creatinine, Ser: 0.78 mg/dL (ref 0.44–1.00)
Creatinine, Ser: 0.74 mg/dL (ref 0.44–1.00)
GFR, Estimated: >60 mL/min (ref >60)
GFR, Estimated: >60 mL/min (ref >60)
GFR, Estimated: >60 mL/min (ref >60)
Glucose, Bld: 87 mg/dL (ref 70–99)
Glucose, Bld: 100 mg/dL — ABNORMAL HIGH (ref 70–99)
Glucose, Bld: 95 mg/dL (ref 70–99)
Potassium: 2.3 mmol/L — CL (ref 3.5–5.1)
Potassium: 3 mmol/L — ABNORMAL LOW (ref 3.5–5.1)
Potassium: 3.3 mmol/L — ABNORMAL LOW (ref 3.5–5.1)
Sodium: 138 mmol/L (ref 135–145)
Sodium: 137 mmol/L (ref 135–145)
Sodium: 134 mmol/L — ABNORMAL LOW (ref 135–145)

## 2023-01-09 LAB — URINALYSIS, ROUTINE W REFLEX MICROSCOPIC
Bilirubin Urine: NEGATIVE
Glucose, UA: NEGATIVE mg/dL
Ketones, ur: 20 mg/dL — AB
Nitrite: NEGATIVE
Protein, ur: NEGATIVE mg/dL
Specific Gravity, Urine: 1.006 (ref 1.005–1.030)
pH: 7 (ref 5.0–8.0)

## 2023-01-09 LAB — RAPID URINE DRUG SCREEN, HOSP PERFORMED
Amphetamines: NOT DETECTED
Barbiturates: NOT DETECTED
Benzodiazepines: NOT DETECTED
Cocaine: NOT DETECTED
Opiates: NOT DETECTED
Tetrahydrocannabinol: NOT DETECTED

## 2023-01-09 LAB — LITHIUM LEVEL: Lithium Lvl: <0.06 mmol/L — ABNORMAL LOW (ref 0.60–1.20)

## 2023-01-09 MED ORDER — SODIUM CHLORIDE 0.9 % IV SOLN: Freq: Once | INTRAVENOUS | Status: AC

## 2023-01-09 MED ORDER — POTASSIUM CHLORIDE CRYS ER 20 MEQ PO TBCR: 40.0000 meq | EXTENDED_RELEASE_TABLET | Freq: Once | ORAL | Status: DC

## 2023-01-09 MED ORDER — POTASSIUM CHLORIDE 20 MEQ PO PACK
20.0000 meq | PACK | Freq: Two times a day (BID) | ORAL | Status: DC
  Administered 2023-01-09 – 2023-01-21 (×8): 20 meq via ORAL
  Filled 2023-01-09 (×13): qty 1

## 2023-01-09 MED ORDER — POTASSIUM CHLORIDE 10 MEQ/100ML IV SOLN
10.0000 meq | Freq: Once | INTRAVENOUS | Status: AC
  Administered 2023-01-09: 10 meq via INTRAVENOUS
  Filled 2023-01-09: qty 100

## 2023-01-09 MED ORDER — LORAZEPAM 1 MG PO TABS: 1.0000 mg | ORAL_TABLET | Freq: Three times a day (TID) | ORAL | Status: DC | PRN

## 2023-01-09 NOTE — Progress Notes (Signed)
Pt meets inpatient BH placement per Earney Navy, NP-PMHNP-BC. CSW has requeted [facility] to review.   [name], MSW, [facility] 01/09/2023 6:56 PM

## 2023-01-09 NOTE — ED Notes (Signed)
Pt refused lunch tray 

## 2023-01-09 NOTE — Social Work (Signed)
CSW has reviewed chart, once patient is PSYCH clear TOC will follow up with needs.

## 2023-01-09 NOTE — Consult Note (Signed)
[facility] ED ASSESSMENT   Reason for Consult:  Psychiatry evaluation Referring Physician:  ER Physician Patient Identification: [name] MRN:  [id number] ED Chief Complaint: Paranoid schizophrenia (HCC)  Diagnosis:  Principal Problem:   Paranoid schizophrenia (HCC)   ED Assessment Time Calculation: Start Time: 1527 Stop Time: 1550 Total Time in Minutes (Assessment Completion): 23   Subjective:   [name] is a 62 y.o. female patient admitted with Previous hx of Paranoid Schizophrenia, Schizoaffective disorder Bipolar type brought in under IVC taken out by her legal Guardian.. IVC Paper states that patient would not turn her AC on because the air is poison.  She will not eating or drinking because her food is poisoned.  HPI:  Patient was seen by this provider and she did not engage in meaningful conversation.  Her discussion is Centered in being poisoned by people around her.  Patient was speaking loud and all through made no sense.  She states that some people took her plot of land.   Call to her Legal Guardian  Howeth-Lennor, [place] who reports that patient has not been eating or drinking for a while because she believes her food and water are poisoned.  The food she bought her she refused to cook it by herself because it is poisoned.  She turned off her air conditioner and refused to open her windows for fresh air because the air is polluted.  Legal Guardian states that patient believes that her family members are encroaching in her privacy.   This provider took care of patient for three years at another hospital.  Her Paranoia is Chronic.  Provider educated RN that patient will only take pills opened in her presence.  She eats packaged food that she opens by herself.  She only drinks water opened in her presence. Patient appears emaciated and lost weight due to not eating.  She has been refusing Potassium for Potassium replacement of 3.3  She arrived with Potassium of 2.3.  Last QTC Interval  is 561.  We will avoid antipsychotics at this time due to the QTC Interval and use Ativan for agitation. Past Psychiatric History: Previous hx of Paranoid Schizophrenia, Schizoaffective disorder Bipolar type.  Multiple inpatient Psychiatry hospitalizations.  Last hospitalization was at [facility] in January this year.  She is supposed to receive care at [facility] in [place] but I am not sure she utilizes the Clinic.  Risk to Self or Others: Is the patient at risk to self? Yes Has the patient been a risk to self in the past 6 months? Yes Has the patient been a risk to self within the distant past? Yes Is the patient a risk to others? No Has the patient been a risk to others in the past 6 months? No Has the patient been a risk to others within the distant past? No  [place] Scale:  Flowsheet Row Admission (Discharged) from 04/08/2022 in [facility] INPATIENT BEHAVIORAL MEDICINE Admission (Discharged) from 10/17/2021 in [facility] BEHAVIORAL MEDICINE Admission (Discharged) from 10/02/2021 in [facility] REGIONAL CARDIAC MED PCU  C-SSRS RISK CATEGORY No Risk No Risk No Risk       AIMS:  , , ,  ,   ASAM:    Substance Abuse:     Past Medical History:  Past Medical History:  Diagnosis Date   Anemia 10/02/2021   Non compliance w medication regimen    Schizophrenia [facility])     Past Surgical History:  Procedure Laterality Date   BIOPSY  09/25/2021   Procedure:  BIOPSY;  Surgeon: Rachael Fee, MD;  Location: [facility];  Service: Gastroenterology;;   ESOPHAGOGASTRODUODENOSCOPY (EGD) WITH PROPOFOL [name] 09/25/2021   Procedure: ESOPHAGOGASTRODUODENOSCOPY (EGD) WITH PROPOFOL;  Surgeon: Rachael Fee, MD;  Location: [facility];  Service: Gastroenterology;  Laterality: [name];  With NGT placement   Family History: History reviewed. No pertinent family history. Family Psychiatric  History: Patient is unable to provide this information. Social History:  Social History   Substance and Sexual Activity   Alcohol Use Not Currently   Comment: Refuses to disclose how much     Social History   Substance and Sexual Activity  Drug Use No   Comment: Refuses to answer    Social History   Socioeconomic History   Marital status: Single    Spouse name: Not on file   Number of children: Not on file   Years of education: Not on file   Highest education level: Not on file  Occupational History   Not on file  Tobacco Use   Smoking status: Every Day    Packs/day: 2    Types: Cigarettes   Smokeless tobacco: Never  Substance and Sexual Activity   Alcohol use: Not Currently    Comment: Refuses to disclose how much   Drug use: No    Comment: Refuses to answer   Sexual activity: Not Currently    Comment: refused to answer  Other Topics Concern   Not on file  Social History Narrative   Not on file   Social Determinants of Health   Financial Resource Strain: Not on file  Food Insecurity: No Food Insecurity (04/08/2022)   Hunger Vital Sign    Worried About Running Out of Food in the Last Year: Never true    Ran Out of Food in the Last Year: Never true  Transportation Needs: No Transportation Needs (04/08/2022)   PRAPARE - Administrator, Civil Service (Medical): No    Lack of Transportation (Non-Medical): No  Physical Activity: Not on file  Stress: Not on file  Social Connections: Not on file   Additional Social History:    Allergies:   Allergies  Allergen Reactions   Aspirin Hives and Other (See Comments)    Possibly caused hives, per sister   Ziprasidone Hcl Rash and Other (See Comments)    Caused twitching. Redness of eyes.     Labs:  Results for orders placed or performed during the hospital encounter of 01/08/23 (from the past 48 hour(s))  Comprehensive metabolic panel     Status: Abnormal   Collection Time: 01/08/23  7:00 PM  Result Value Ref Range   Sodium 139 135 - 145 mmol/L   Potassium 2.3 (LL) 3.5 - 5.1 mmol/L    Comment: CRITICAL RESULT CALLED TO,  READ BACK BY AND VERIFIED WITH RIVERS,T AT 2001 ON 01/08/23 BY LUZOLOP    Chloride 100 98 - 111 mmol/L   CO2 29 22 - 32 mmol/L   Glucose, Bld 92 70 - 99 mg/dL    Comment: Glucose reference range applies only to samples taken after fasting for at least 8 hours.   BUN 17 8 - 23 mg/dL   Creatinine, Ser 1.61 (H) 0.44 - 1.00 mg/dL   Calcium 8.6 (L) 8.9 - 10.3 mg/dL   Total Protein 7.6 6.5 - 8.1 g/dL   Albumin 4.3 3.5 - 5.0 g/dL   AST 37 15 - 41 U/L   ALT 17 0 - 44 U/L   Alkaline Phosphatase 51  38 - 126 U/L   Total Bilirubin 0.6 0.3 - 1.2 mg/dL   GFR, Estimated 53 (L) >60 mL/min    Comment: (NOTE) Calculated using the CKD-EPI Creatinine Equation (2021)    Anion gap 10 5 - 15    Comment: Performed at [facility], [address] [place], [place] [address]  Ethanol     Status: None   Collection Time: 01/08/23  7:00 PM  Result Value Ref Range   Alcohol, Ethyl (B) <10 <10 mg/dL    Comment: (NOTE) Lowest detectable limit for serum alcohol is 10 mg/dL.  For medical purposes only. Performed at [facility], [address], [place], [place] [address]   CBC with Diff     Status: None   Collection Time: 01/08/23  7:00 PM  Result Value Ref Range   WBC 5.1 4.0 - 10.5 K/uL   RBC 4.63 3.87 - 5.11 MIL/uL   Hemoglobin 12.7 12.0 - 15.0 g/dL   HCT 09.8 11.9 - 14.7 %   MCV 86.4 80.0 - 100.0 fL   MCH 27.4 26.0 - 34.0 pg   MCHC 31.8 30.0 - 36.0 g/dL   RDW 82.9 56.2 - 13.0 %   Platelets 207 150 - 400 K/uL   nRBC 0.0 0.0 - 0.2 %   Neutrophils Relative % 78 %   Neutro Abs 4.0 1.7 - 7.7 K/uL   Lymphocytes Relative 17 %   Lymphs Abs 0.9 0.7 - 4.0 K/uL   Monocytes Relative 5 %   Monocytes Absolute 0.2 0.1 - 1.0 K/uL   Eosinophils Relative 0 %   Eosinophils Absolute 0.0 0.0 - 0.5 K/uL   Basophils Relative 0 %   Basophils Absolute 0.0 0.0 - 0.1 K/uL   Immature Granulocytes 0 %   Abs Immature Granulocytes 0.01 0.00 - 0.07 K/uL    Comment: Performed at  [facility], [address] [place], [place] [address]  CK     Status: Abnormal   Collection Time: 01/08/23  7:00 PM  Result Value Ref Range   Total CK 235 (H) 38 - 234 U/L    Comment: Performed at [facility], [address] [place], [place] [address]  Magnesium     Status: Abnormal   Collection Time: 01/08/23  7:00 PM  Result Value Ref Range   Magnesium 2.5 (H) 1.7 - 2.4 mg/dL    Comment: Performed at [facility], [address], [place], [place] [address]  Acetaminophen level     Status: Abnormal   Collection Time: 01/08/23  7:00 PM  Result Value Ref Range   Acetaminophen (Tylenol), Serum <10 (L) 10 - 30 ug/mL    Comment: (NOTE) Therapeutic concentrations vary significantly. A range of 10-30 ug/mL  may be an effective concentration for many patients. However, some  are best treated at concentrations outside of this range. Acetaminophen concentrations >150 ug/mL at 4 hours after ingestion  and >50 ug/mL at 12 hours after ingestion are often associated with  toxic reactions.  Performed at [facility], [address] [place], [place] [address]   Salicylate level     Status: Abnormal   Collection Time: 01/08/23  7:00 PM  Result Value Ref Range   Salicylate Lvl <7.0 (L) 7.0 - 30.0 mg/dL    Comment: Performed at [facility], [address], [place], [place] [address]  Basic metabolic panel     Status: Abnormal   Collection Time:  01/08/23 11:08 PM  Result Value Ref Range   Sodium 138 135 - 145 mmol/L   Potassium 2.3 (LL) 3.5 - 5.1 mmol/L    Comment: CRITICAL RESULT CALLED TO, READ BACK BY AND VERIFIED WITH RIVERS,T AT 0041 ON 01/09/23 BY LUZOLOP    Chloride 101 98 - 111 mmol/L   CO2 29 22 - 32 mmol/L   Glucose, Bld 87 70 - 99 mg/dL    Comment: Glucose reference range applies only to samples taken after fasting for at least 8 hours.   BUN 14 8 - 23 mg/dL   Creatinine, Ser 8.41  0.44 - 1.00 mg/dL   Calcium 8.2 (L) 8.9 - 10.3 mg/dL   GFR, Estimated >66 >06 mL/min    Comment: (NOTE) Calculated using the CKD-EPI Creatinine Equation (2021)    Anion gap 8 5 - 15    Comment: Performed at [facility], [address], [place], [place] [address]  Basic metabolic panel     Status: Abnormal   Collection Time: 01/09/23  5:00 AM  Result Value Ref Range   Sodium 137 135 - 145 mmol/L   Potassium 3.0 (L) 3.5 - 5.1 mmol/L   Chloride 100 98 - 111 mmol/L   CO2 25 22 - 32 mmol/L   Glucose, Bld 100 (H) 70 - 99 mg/dL    Comment: Glucose reference range applies only to samples taken after fasting for at least 8 hours.   BUN 11 8 - 23 mg/dL   Creatinine, Ser 1.09 0.44 - 1.00 mg/dL   Calcium 8.6 (L) 8.9 - 10.3 mg/dL   GFR, Estimated >32 >35 mL/min    Comment: (NOTE) Calculated using the CKD-EPI Creatinine Equation (2021)    Anion gap 12 5 - 15    Comment: Performed at [facility], [address], [place], [place] [address]  Urine rapid drug screen (hosp performed)     Status: None   Collection Time: 01/09/23  7:25 AM  Result Value Ref Range   Opiates NONE DETECTED NONE DETECTED   Cocaine NONE DETECTED NONE DETECTED   Benzodiazepines NONE DETECTED NONE DETECTED   Amphetamines NONE DETECTED NONE DETECTED   Tetrahydrocannabinol NONE DETECTED NONE DETECTED   Barbiturates NONE DETECTED NONE DETECTED    Comment: (NOTE) DRUG SCREEN FOR MEDICAL PURPOSES ONLY.  IF CONFIRMATION IS NEEDED FOR ANY PURPOSE, NOTIFY LAB WITHIN 5 DAYS.  LOWEST DETECTABLE LIMITS FOR URINE DRUG SCREEN Drug Class                     Cutoff (ng/mL) Amphetamine and metabolites    1000 Barbiturate and metabolites    200 Benzodiazepine                 200 Opiates and metabolites        300 Cocaine and metabolites        300 THC                            50 Performed at [facility], [address], [place], [place] [address]   Urinalysis,  Routine w reflex microscopic -Urine, Clean Catch     Status: Abnormal   Collection Time: 01/09/23  7:25 AM  Result Value Ref Range   Color, Urine STRAW (A) YELLOW   APPearance CLEAR CLEAR   Specific Gravity, Urine 1.006 1.005 - 1.030   pH 7.0 5.0 - 8.0   Glucose,  UA NEGATIVE NEGATIVE mg/dL   Hgb urine dipstick MODERATE (A) NEGATIVE   Bilirubin Urine NEGATIVE NEGATIVE   Ketones, ur 20 (A) NEGATIVE mg/dL   Protein, ur NEGATIVE NEGATIVE mg/dL   Nitrite NEGATIVE NEGATIVE   Leukocytes,Ua TRACE (A) NEGATIVE   RBC / HPF 6-10 0 - 5 RBC/hpf   WBC, UA 0-5 0 - 5 WBC/hpf   Bacteria, UA FEW (A) NONE SEEN   Squamous Epithelial / HPF 0-5 0 - 5 /HPF    Comment: Performed at [facility], [address], [place], [place] [address]  Basic metabolic panel     Status: Abnormal   Collection Time: 01/09/23 11:20 AM  Result Value Ref Range   Sodium 134 (L) 135 - 145 mmol/L   Potassium 3.3 (L) 3.5 - 5.1 mmol/L   Chloride 96 (L) 98 - 111 mmol/L   CO2 28 22 - 32 mmol/L   Glucose, Bld 95 70 - 99 mg/dL    Comment: Glucose reference range applies only to samples taken after fasting for at least 8 hours.   BUN 7 (L) 8 - 23 mg/dL   Creatinine, Ser 1.30 0.44 - 1.00 mg/dL   Calcium 8.6 (L) 8.9 - 10.3 mg/dL   GFR, Estimated >86 >57 mL/min    Comment: (NOTE) Calculated using the CKD-EPI Creatinine Equation (2021)    Anion gap 10 5 - 15    Comment: Performed at [facility], [address] [place], [place] [address]  Lithium level     Status: Abnormal   Collection Time: 01/09/23  1:45 PM  Result Value Ref Range   Lithium Lvl <0.06 (L) 0.60 - 1.20 mmol/L    Comment: Performed at [facility], [address], [place], [place] [address]    Current Facility-Administered Medications  Medication Dose Route Frequency Provider Last Rate Last Admin   potassium chloride (KLOR-CON) packet 20 mEq  20 mEq Oral BID Randel Pigg, Dorma Russell, MD   20 mEq at 01/09/23  1354   Current Outpatient Medications  Medication Sig Dispense Refill   haloperidol (HALDOL) 5 MG tablet Take 1 tablet (5 mg total) by mouth 2 (two) times daily. 20 tablet 0   lithium carbonate 300 MG capsule Take 2 capsules (600 mg total) by mouth at bedtime. 20 capsule 0   mirtazapine (REMERON) 30 MG tablet Take 1 tablet (30 mg total) by mouth at bedtime. 10 tablet 0   Multiple Vitamin (MULTIVITAMIN WITH MINERALS) TABS tablet Take 1 tablet by mouth daily. 10 tablet 0   nicotine (NICODERM CQ - DOSED IN MG/24 HR) 7 mg/24hr patch Place 1 patch (7 mg total) onto the skin daily. (Remove old patch before applying new patch). 14 patch 0   OLANZapine (ZYPREXA) 15 MG tablet Take 2 tablets (30 mg total) by mouth at bedtime. 20 tablet 0   QUEtiapine (SEROQUEL) 400 MG tablet Take 1 tablet (400 mg total) by mouth at bedtime. 10 tablet 0   thiamine (VITAMIN B1) 100 MG tablet Take 1 tablet (100 mg total) by mouth daily. 10 tablet 0    Musculoskeletal: Strength & Muscle Tone:  seen in bed lying down Gait & Station:  seen in bed lying down Patient leans:  see above.   Psychiatric Specialty Exam: Presentation  General Appearance:  Appropriate for Environment  Eye Contact: Fleeting  Speech: Pressured  Speech Volume: Increased (nonsensical, yelling at provider accusing people of taking her plot of land.)  Handedness: Right   Mood and  Affect  Mood: Angry; Irritable; Labile  Affect: Congruent; Labile; Tearful   Thought Process  Thought Processes: Disorganized  Descriptions of Associations:Tangential  Orientation:Partial  Thought Content:Illogical; Paranoid Ideation  History of Schizophrenia/Schizoaffective disorder:Yes  Duration of Psychotic Symptoms:Greater than six months  Hallucinations:Hallucinations: None  Ideas of Reference:Paranoia  Suicidal Thoughts:Suicidal Thoughts: No  Homicidal Thoughts:Homicidal Thoughts: No   Sensorium  Memory: Immediate Poor; Recent  Poor; Remote Poor  Judgment: Impaired  Insight: Lacking   Executive Functions  Concentration: Poor  Attention Span: Poor  Recall: Poor  Fund of Knowledge: Poor  Language: Poor   Psychomotor Activity  Psychomotor Activity: Psychomotor Activity: Normal   Assets  Assets: Social Support    Sleep  Sleep:No data recorded  Physical Exam: Physical Exam-Patient is too Psychotic to allow this assessment ROS-Unable to assess, patient is too Psychotic to engage. Blood pressure 134/72, pulse 87, temperature 97.8 F (36.6 C), resp. rate 18, height 5\' 4"  (1.626 m), weight 40.4 kg, SpO2 95 %. Body mass index is 15.29 kg/m.  Medical Decision Making: Patient meets criteria for inpatient Psychiatry hospitalization.  Based on Prolonged QTC interval we will hold off on antipsychotic and offer Ativan for agitation.  However patient may need Forced Medication protocol as she continues to refuse oral intake including Medications.  Problem 1: Paranoid Schizophrenia.  Disposition: Recommend psychiatric Inpatient admission when medically cleared.  Earney Navy, NP-PMHNP-BC 01/09/2023 4:20 PM

## 2023-01-09 NOTE — ED Notes (Signed)
Hospitalist at [facility]. Pt verbalizing limited and particular diet, excluding much. Refuses much. Refuses KCL pills. Agreeable to cran-grape.

## 2023-01-09 NOTE — ED Notes (Signed)
Psych NP at [facility] for TTS, pt uncooperative/ uncommunicative. Sitter present.

## 2023-01-09 NOTE — ED Provider Notes (Signed)
10:34 AM Discussed case with hospitalist, Dr. Edward Jolly. Patient's been waiting on hospitalist consultation, he is catching up from leftover patients from last night (after hospitalist capped). Consultation vs admission. K is up to 3.0 on 5 AM labs. Will repeat EKG as the QTC was prolonged and repeat BMP. Depending on those results will be admitted vs consultation and psychiatry disposition.  11:57 AM QTC remains prolonged, though K is up to 3.3. She's also on seroquel which may be contributing. Dr. Edward Jolly updated, will consult and discuss with psychiatry if she will need medical vs psychiatric admission.  12:53 PM Dr. Edward Jolly has seen patient. Patient is agreeable to po K. He feels like [facility] can consult and monitor, but this is primarily psychiatric and doesn't need admission. Will consult TTS.    Pricilla Loveless, MD 01/09/23 1254

## 2023-01-09 NOTE — Consult Note (Signed)
Initial Consultation Note   Patient: [name] ZOX:[id number] DOB: 01-May-1961 PCP: [facility] DOA: 01/08/2023 DOS: the patient was seen and examined on 01/09/2023 Primary service: Default, Provider, MD  Referring physician: Dr. Criss Alvine  Reason for consult: Hypokalemia   Assessment/Plan: Schizophrenia with psychosis She has being IVC, recommend psych admission, treatment per psychiatry.  Hypokalemia Insetting of malnutrition, she is not eating well, due to severe psychosis and paranoia.  On her exam she was very paranoid about eating food.  She has been supplemented with 6 rounds of KCl, she is drinking and snacking, will start potassium packets to be dissolved in water as she is refusing taking pills.  Prolonged QTc This has been chronic, likely from psychiatric medications.  She is asymptomatic.  Hospitalist will continue to monitor, check daily EKGs.   [facility] will continue to follow the patient.    HPI: [name] is a 62 y.o. female with past medical history of schizophrenia, paranoia, malnutrition and noncompliant presented to the emergency department for evaluation of acute psychosis.  Her sister brought patient and IVC was done.  Per ER, she has not been eating much lately because she is afraid of people contaminating her food.  Only eating bacon.  Upon my evaluation patient is not making much sense, she is telling me that every thing that she eats it damaged her tissues, then goes on that people are trying to hurt her and no one is listening.  She keeps repeating that she wants to go home.  She is somewhat agitated however she tells me that she will drink some fluids today.  Upon further questioning, she keeps going back to her food being poisoned and that her " tissue" is suffering.  Review of Systems: unable to review all systems due to the inability of the patient to answer questions.  Past Medical History:  Diagnosis Date   Anemia 10/02/2021   Non compliance w  medication regimen    Schizophrenia [facility])    Past Surgical History:  Procedure Laterality Date   BIOPSY  09/25/2021   Procedure: BIOPSY;  Surgeon: Rachael Fee, MD;  Location: [facility];  Service: Gastroenterology;;   ESOPHAGOGASTRODUODENOSCOPY (EGD) WITH PROPOFOL [name] 09/25/2021   Procedure: ESOPHAGOGASTRODUODENOSCOPY (EGD) WITH PROPOFOL;  Surgeon: Rachael Fee, MD;  Location: [facility];  Service: Gastroenterology;  Laterality: [name];  With NGT placement   Social History:  reports that she has been smoking cigarettes. She has been smoking an average of 2 packs per day. She has never used smokeless tobacco. She reports that she does not currently use alcohol. She reports that she does not use drugs.  Allergies  Allergen Reactions   Aspirin Hives and Other (See Comments)    Possibly caused hives, per sister   Ziprasidone Hcl Rash and Other (See Comments)    Caused twitching. Redness of eyes.     History reviewed. No pertinent family history.  Prior to Admission medications   Medication Sig Start Date End Date Taking? Authorizing Provider  haloperidol (HALDOL) 5 MG tablet Take 1 tablet (5 mg total) by mouth 2 (two) times daily. 08/06/22   [name], [name], MD  lithium carbonate 300 MG capsule Take 2 capsules (600 mg total) by mouth at bedtime. 08/06/22   [name], [name], MD  mirtazapine (REMERON) 30 MG tablet Take 1 tablet (30 mg total) by mouth at bedtime. 08/06/22   [name], [name], MD  Multiple Vitamin (MULTIVITAMIN WITH MINERALS) TABS tablet Take 1 tablet by mouth daily.  08/06/22   [name], [name], MD  nicotine (NICODERM CQ - DOSED IN MG/24 HR) 7 mg/24hr patch Place 1 patch (7 mg total) onto the skin daily. (Remove old patch before applying new patch). 08/06/22   [name], [name], MD  OLANZapine (ZYPREXA) 15 MG tablet Take 2 tablets (30 mg total) by mouth at bedtime. 08/06/22   [name], [name], MD  QUEtiapine (SEROQUEL) 400 MG tablet Take 1 tablet (400 mg total) by mouth at bedtime.  08/06/22   [name], [name], MD  thiamine (VITAMIN B1) 100 MG tablet Take 1 tablet (100 mg total) by mouth daily. 08/06/22   [name], [name], MD    Physical Exam: Vitals:   01/09/23 1200 01/09/23 1215 01/09/23 1223 01/09/23 1230  BP:   123/74   Pulse: (!) 44 (!) 44 (!) 46 (!) 44  Resp: 18 16 (!) 29 15  Temp:      TempSrc:      SpO2: 98% 99% 100% 99%  Weight:      Height:        Data Reviewed:  Potassium 2.3-> 3.0-> 3.3   Family Communication: None Primary team communication: Awaiting for psych  Thank you very much for involving [place] in the care of your patient.  Author: Latrelle Dodrill, MD 01/09/2023 1:00 PM  For on call review [email or web address].

## 2023-01-09 NOTE — ED Notes (Signed)
Sleeping, arousable to voice, NAD, calm, denies pain, cooperative, answers questions, follows commands. Sitter at [employer].

## 2023-01-09 NOTE — Consult Note (Signed)
Attempt to assess patient failed as patient would not interact with provider.  She spoke in loud and Nonsensical voice.  She then asked provider to leave because she does not want to talk.

## 2023-01-10 DIAGNOSIS — E876 Hypokalemia: Secondary | ICD-10-CM

## 2023-01-10 DIAGNOSIS — F200 Paranoid schizophrenia: Secondary | ICD-10-CM

## 2023-01-10 DIAGNOSIS — R9431 Abnormal electrocardiogram [ECG] [EKG]: Secondary | ICD-10-CM

## 2023-01-10 NOTE — ED Notes (Signed)
TTS out to speak with pt, then pt turned on side and went back to sleep. At present time refusing breakfast when asked if she was ready to eat.

## 2023-01-10 NOTE — Progress Notes (Signed)
[facility]  PROGRESS NOTE  [name] JOA:[id number] DOB: June 20, 1961 DOA: 01/08/2023 PCP: [facility]   Brief HPI:   62 year old female with medical history of schizophrenia, paranoia, malnutrition was admitted to hospital for evaluation for acute psychosis.  Patient was brought by her sister, IVC was done as patient was not eating and drinking at home.  Patient paranoid and states that food is poisoned and, did not turn the [facility] on as send air was placed on. TRH consulted for treatment for hypokalemia, prolonged QTc    Assessment/Plan:    Hypokalemia -In setting of poor p.o. intake, due to severe psychosis and paranoia -Started on scheduled potassium supplementation, potassium is improved to 3.3 this morning -Potassium packets to be dissolved in water as patient is refusing pills  Prolonged QTc -Chronic likely due to antipsychotics -Serum magnesium was normal at 2.5   Will continue to follow   Medications     potassium chloride  20 mEq Oral BID     Data Reviewed:   CBG:  No results for input(s): "GLUCAP" in the last 168 hours.  SpO2: 100 %    Vitals:   01/09/23 1434 01/09/23 1500 01/09/23 2121 01/10/23 0639  BP:  134/72 100/72 104/73  Pulse:  87 (!) 50 (!) 53  Resp:  18 18 18   Temp: 97.8 F (36.6 C)  97.8 F (36.6 C) 98.2 F (36.8 C)  TempSrc:   Oral Axillary  SpO2:  95% 100% 100%  Weight:      Height:          Data Reviewed:  Basic Metabolic Panel: Recent Labs  Lab 01/08/23 1900 01/08/23 2308 01/09/23 0500 01/09/23 1120  NA 139 138 137 134*  K 2.3* 2.3* 3.0* 3.3*  CL 100 101 100 96*  CO2 29 29 25 28   GLUCOSE 92 87 100* 95  BUN 17 14 11  7*  CREATININE 1.17* 0.81 0.78 0.74  CALCIUM 8.6* 8.2* 8.6* 8.6*  MG 2.5*  --   --   --     CBC: Recent Labs  Lab 01/08/23 1900  WBC 5.1  NEUTROABS 4.0  HGB 12.7  HCT 40.0  MCV 86.4  PLT 207    LFT Recent Labs  Lab 01/08/23 1900  AST 37  ALT 17  ALKPHOS 51  BILITOT 0.6   PROT 7.6  ALBUMIN 4.3     Antibiotics: Anti-infectives (From admission, onward)    None             Subjective    This morning patient denies any complaints.  Wants to be discharged home.  Did not want to be examined.  Objective    Physical Examination:   Appears agitated Did not want to be examined  Status is: Inpatient:             Meredeth Ide   [facility] If 7PM-7AM, please contact night-coverage at [email or web address], Office  [phone]   01/10/2023, 2:36 PM  LOS: 0 days

## 2023-01-10 NOTE — ED Notes (Signed)
Pt refuses vitals to be taken. Pt offered meal and declined.

## 2023-01-10 NOTE — ED Provider Notes (Signed)
Emergency Medicine Observation Re-evaluation Note  [name] is a 62 y.o. female, seen on rounds today.  Pt initially presented to the ED for complaints of IVC Currently, the patient is sleeping.  Physical Exam  BP 104/73 (BP Location: Right Arm)   Pulse (!) 53   Temp 98.2 F (36.8 C) (Axillary)   Resp 18   Ht 5\' 4"  (1.626 m)   Wt 40.4 kg   SpO2 100%   BMI 15.29 kg/m  Physical Exam General: Sleeping Cardiac: Extremities well-perfused Lungs: Breathing is unlabored Psych: Deferred  ED Course / MDM  EKG:EKG Interpretation  Date/Time:  Saturday January 09 2023 11:21:17 EDT Ventricular Rate:  46 PR Interval:  122 QRS Duration: 89 QT Interval:  641 QTC Calculation: 561 R Axis:   27 Text Interpretation: Sinus bradycardia Abnrm T, consider ischemia, anterolateral lds Prolonged QT interval similar to yesterday T wave changes are similar to multiple priors, but prolonged QT is still prolonged as it was yesterday Reconfirmed by Pricilla Loveless [phone]) on 01/09/2023 11:57:20 AM  I have reviewed the labs performed to date as well as medications administered while in observation.  Recent changes in the last 24 hours include patient was medically cleared.  She was evaluated by TTS who recommends inpatient psychiatric admission.  Plan  Current plan is for inpatient psychiatric admission.    Gloris Manchester, MD 01/10/23 [phone]

## 2023-01-10 NOTE — Progress Notes (Signed)
CSW faxed [facility] a completed [facility] referral for continued review. CSW will continue to seek recommended disposition.    Damita Dunnings, MSW, LCSW-A  11:48 AM 01/10/2023

## 2023-01-10 NOTE — ED Notes (Signed)
Pt refused meds, Yelling out, "I want to go home."

## 2023-01-10 NOTE — Progress Notes (Signed)
Patient has been denied by [facility] due to no appropriate beds available. Patient meets BH inpatient criteria per Fayette Pho, NP. Patient has been faxed out to the following facilities:   [facility]  [address] [place] [address] [phone]  [facility]  [address] [place] [place] [address] [phone]  [facility]  [address] [place] [address] [phone]  [facility] [address]  [address], [place] [address] [phone]  [facility]  [address] [place], [place] [address] [phone]  [facility]  [address] [place], [place] [address] [phone]  [facility]  [address], [place] [address] [phone]  [facility]  [address] [place], [place] [address] [phone]  [facility]  [address] [place] [address] [phone]  [facility]  [address], [place] [address] [phone]  [facility]  [address] [place] [address] [phone]  [facility]  [address] [place] [address] [phone]  [facility]  [address] [place] [address] [phone]  [facility]  [address] [place], [place] [address] [phone]  [facility]  [address] [place] [address] [phone]  [facility]  [address] [place] [address] [phone]   Damita Dunnings, MSW, LCSW-A  10:22 AM 01/10/2023

## 2023-01-10 NOTE — ED Notes (Signed)
Pt will not take medications. Pt states she needs to take potassium by a banana. Pt offered a banana and refused.

## 2023-01-10 NOTE — Progress Notes (Signed)
Patient is still under [facility] care. There are no TOC needs at this time per NP. CSW explained that if new TOC arise please put in consult.

## 2023-01-10 NOTE — Progress Notes (Signed)
[facility] Psych ED Progress Note  01/10/2023 1:06 PM [name]  MRN:  [id number]   Subjective:  [name] is a 62 y.o. female patient admitted with Previous hx of Paranoid Schizophrenia, Schizoaffective disorder Bipolar type brought in under IVC taken out by her legal Guardian.  IVC Paper states that patient would not turn her AC on because the air is poison.  She will not eating or drinking because her food is poisoned.  Patient continues to need inpatient Psychiatry hospitalization.  Patient remains paranoid refusing Medications, meals and any kind of Liquid.  This morning she refused Potassium replacement.  She is refusing V/S and Covid test which was requested by a hospital interested in her.  Patient yells at staff asking to be discharged home.  Legal guardian reports that family members are worried she is going to starve herself to death.  Patient does not allow them come in to help her or cook for her .  She refused to cook the steak meat brought in by them and she is and have  not been taking her medications as well.  We will continue to monitor patient.  Patient may need forced Medication protocol at a point. Principal Problem: Paranoid schizophrenia (HCC) Diagnosis:  Principal Problem:   Paranoid schizophrenia [facility])   ED Assessment Time Calculation: Start Time: 1244 Stop Time: 1303 Total Time in Minutes (Assessment Completion): 19   Past Psychiatric History: see initial Psychiatry evaluation note  [place] Scale:  Flowsheet Row ED from 01/08/2023 in [facility] Admission (Discharged) from 04/08/2022 in [facility] INPATIENT BEHAVIORAL MEDICINE Admission (Discharged) from 10/17/2021 in [facility] BEHAVIORAL MEDICINE  C-SSRS RISK CATEGORY No Risk No Risk No Risk       Past Medical History:  Past Medical History:  Diagnosis Date   Anemia 10/02/2021   Non compliance w medication regimen    Schizophrenia [facility])     Past Surgical History:   Procedure Laterality Date   BIOPSY  09/25/2021   Procedure: BIOPSY;  Surgeon: Rachael Fee, MD;  Location: [facility];  Service: Gastroenterology;;   ESOPHAGOGASTRODUODENOSCOPY (EGD) WITH PROPOFOL [name] 09/25/2021   Procedure: ESOPHAGOGASTRODUODENOSCOPY (EGD) WITH PROPOFOL;  Surgeon: Rachael Fee, MD;  Location: [facility];  Service: Gastroenterology;  Laterality: [name];  With NGT placement   Family History: History reviewed. No pertinent family history. Family Psychiatric  History: see initial Psychiatry evaluation note Social History:  Social History   Substance and Sexual Activity  Alcohol Use Not Currently   Comment: Refuses to disclose how much     Social History   Substance and Sexual Activity  Drug Use No   Comment: Refuses to answer    Social History   Socioeconomic History   Marital status: Single    Spouse name: Not on file   Number of children: Not on file   Years of education: Not on file   Highest education level: Not on file  Occupational History   Not on file  Tobacco Use   Smoking status: Every Day    Packs/day: 2    Types: Cigarettes    Passive exposure: Current   Smokeless tobacco: Never  Vaping Use   Vaping Use: Never used  Substance and Sexual Activity   Alcohol use: Not Currently    Comment: Refuses to disclose how much   Drug use: No    Comment: Refuses to answer   Sexual activity: Not Currently    Comment: refused to answer  Other Topics Concern   Not on file  Social History Narrative   Not on file   Social Determinants of Health   Financial Resource Strain: Not on file  Food Insecurity: No Food Insecurity (04/08/2022)   Hunger Vital Sign    Worried About Running Out of Food in the Last Year: Never true    Ran Out of Food in the Last Year: Never true  Transportation Needs: No Transportation Needs (04/08/2022)   PRAPARE - Administrator, Civil Service (Medical): No    Lack of Transportation (Non-Medical): No  Physical  Activity: Not on file  Stress: Not on file  Social Connections: Not on file    Sleep: Fair  Appetite:  Poor  Current Medications: Current Facility-Administered Medications  Medication Dose Route Frequency Provider Last Rate Last Admin   LORazepam (ATIVAN) tablet 1 mg  1 mg Oral Q8H PRN Welford Roche, Karmyn Lowman C, NP       potassium chloride (KLOR-CON) packet 20 mEq  20 mEq Oral BID Randel Pigg, Dorma Russell, MD   20 mEq at 01/09/23 2156   Current Outpatient Medications  Medication Sig Dispense Refill   haloperidol (HALDOL) 5 MG tablet Take 1 tablet (5 mg total) by mouth 2 (two) times daily. 20 tablet 0   lithium carbonate 300 MG capsule Take 2 capsules (600 mg total) by mouth at bedtime. 20 capsule 0   mirtazapine (REMERON) 30 MG tablet Take 1 tablet (30 mg total) by mouth at bedtime. 10 tablet 0   Multiple Vitamin (MULTIVITAMIN WITH MINERALS) TABS tablet Take 1 tablet by mouth daily. 10 tablet 0   nicotine (NICODERM CQ - DOSED IN MG/24 HR) 7 mg/24hr patch Place 1 patch (7 mg total) onto the skin daily. (Remove old patch before applying new patch). 14 patch 0   OLANZapine (ZYPREXA) 15 MG tablet Take 2 tablets (30 mg total) by mouth at bedtime. 20 tablet 0   QUEtiapine (SEROQUEL) 400 MG tablet Take 1 tablet (400 mg total) by mouth at bedtime. 10 tablet 0   thiamine (VITAMIN B1) 100 MG tablet Take 1 tablet (100 mg total) by mouth daily. 10 tablet 0    Lab Results:  Results for orders placed or performed during the hospital encounter of 01/08/23 (from the past 48 hour(s))  Comprehensive metabolic panel     Status: Abnormal   Collection Time: 01/08/23  7:00 PM  Result Value Ref Range   Sodium 139 135 - 145 mmol/L   Potassium 2.3 (LL) 3.5 - 5.1 mmol/L    Comment: CRITICAL RESULT CALLED TO, READ BACK BY AND VERIFIED WITH RIVERS,T AT 2001 ON 01/08/23 BY LUZOLOP    Chloride 100 98 - 111 mmol/L   CO2 29 22 - 32 mmol/L   Glucose, Bld 92 70 - 99 mg/dL    Comment: Glucose reference range applies only  to samples taken after fasting for at least 8 hours.   BUN 17 8 - 23 mg/dL   Creatinine, Ser 6.71 (H) 0.44 - 1.00 mg/dL   Calcium 8.6 (L) 8.9 - 10.3 mg/dL   Total Protein 7.6 6.5 - 8.1 g/dL   Albumin 4.3 3.5 - 5.0 g/dL   AST 37 15 - 41 U/L   ALT 17 0 - 44 U/L   Alkaline Phosphatase 51 38 - 126 U/L   Total Bilirubin 0.6 0.3 - 1.2 mg/dL   GFR, Estimated 53 (L) >60 mL/min    Comment: (NOTE) Calculated using the CKD-EPI Creatinine Equation (2021)  Anion gap 10 5 - 15    Comment: Performed at [facility], [address] [place], [place] [address]  Ethanol     Status: None   Collection Time: 01/08/23  7:00 PM  Result Value Ref Range   Alcohol, Ethyl (B) <10 <10 mg/dL    Comment: (NOTE) Lowest detectable limit for serum alcohol is 10 mg/dL.  For medical purposes only. Performed at [facility], [address], [place], [place] [address]   CBC with Diff     Status: None   Collection Time: 01/08/23  7:00 PM  Result Value Ref Range   WBC 5.1 4.0 - 10.5 K/uL   RBC 4.63 3.87 - 5.11 MIL/uL   Hemoglobin 12.7 12.0 - 15.0 g/dL   HCT 91.4 78.2 - 95.6 %   MCV 86.4 80.0 - 100.0 fL   MCH 27.4 26.0 - 34.0 pg   MCHC 31.8 30.0 - 36.0 g/dL   RDW 21.3 08.6 - 57.8 %   Platelets 207 150 - 400 K/uL   nRBC 0.0 0.0 - 0.2 %   Neutrophils Relative % 78 %   Neutro Abs 4.0 1.7 - 7.7 K/uL   Lymphocytes Relative 17 %   Lymphs Abs 0.9 0.7 - 4.0 K/uL   Monocytes Relative 5 %   Monocytes Absolute 0.2 0.1 - 1.0 K/uL   Eosinophils Relative 0 %   Eosinophils Absolute 0.0 0.0 - 0.5 K/uL   Basophils Relative 0 %   Basophils Absolute 0.0 0.0 - 0.1 K/uL   Immature Granulocytes 0 %   Abs Immature Granulocytes 0.01 0.00 - 0.07 K/uL    Comment: Performed at [facility], [address] [place], [place] [address]  CK     Status: Abnormal   Collection Time: 01/08/23  7:00 PM  Result Value Ref Range   Total CK 235 (H) 38 - 234 U/L    Comment:  Performed at [facility], [address], [place], [place] [address]  Magnesium     Status: Abnormal   Collection Time: 01/08/23  7:00 PM  Result Value Ref Range   Magnesium 2.5 (H) 1.7 - 2.4 mg/dL    Comment: Performed at [facility], [address], [place], [place] [address]  Acetaminophen level     Status: Abnormal   Collection Time: 01/08/23  7:00 PM  Result Value Ref Range   Acetaminophen (Tylenol), Serum <10 (L) 10 - 30 ug/mL    Comment: (NOTE) Therapeutic concentrations vary significantly. A range of 10-30 ug/mL  may be an effective concentration for many patients. However, some  are best treated at concentrations outside of this range. Acetaminophen concentrations >150 ug/mL at 4 hours after ingestion  and >50 ug/mL at 12 hours after ingestion are often associated with  toxic reactions.  Performed at [facility], [address], [place], [place] [address]   Salicylate level     Status: Abnormal   Collection Time: 01/08/23  7:00 PM  Result Value Ref Range   Salicylate Lvl <7.0 (L) 7.0 - 30.0 mg/dL    Comment: Performed at [facility], [address] [place], [place] [address]  Basic metabolic panel     Status: Abnormal   Collection Time: 01/08/23 11:08 PM  Result Value Ref Range   Sodium 138 135 - 145 mmol/L   Potassium 2.3 (LL) 3.5 - 5.1 mmol/L    Comment: CRITICAL RESULT CALLED TO, READ BACK BY  AND VERIFIED WITH RIVERS,T AT 0041 ON 01/09/23 BY LUZOLOP    Chloride 101 98 - 111 mmol/L   CO2 29 22 - 32 mmol/L   Glucose, Bld 87 70 - 99 mg/dL    Comment: Glucose reference range applies only to samples taken after fasting for at least 8 hours.   BUN 14 8 - 23 mg/dL   Creatinine, Ser 1.61 0.44 - 1.00 mg/dL   Calcium 8.2 (L) 8.9 - 10.3 mg/dL   GFR, Estimated >09 >60 mL/min    Comment: (NOTE) Calculated using the CKD-EPI Creatinine Equation (2021)    Anion gap 8 5 - 15    Comment: Performed  at [facility], [address], [place], [place] [address]  Basic metabolic panel     Status: Abnormal   Collection Time: 01/09/23  5:00 AM  Result Value Ref Range   Sodium 137 135 - 145 mmol/L   Potassium 3.0 (L) 3.5 - 5.1 mmol/L   Chloride 100 98 - 111 mmol/L   CO2 25 22 - 32 mmol/L   Glucose, Bld 100 (H) 70 - 99 mg/dL    Comment: Glucose reference range applies only to samples taken after fasting for at least 8 hours.   BUN 11 8 - 23 mg/dL   Creatinine, Ser 8.11 0.44 - 1.00 mg/dL   Calcium 8.6 (L) 8.9 - 10.3 mg/dL   GFR, Estimated >91 >47 mL/min    Comment: (NOTE) Calculated using the CKD-EPI Creatinine Equation (2021)    Anion gap 12 5 - 15    Comment: Performed at [facility] [address] [place], [place] [address]  Urine rapid drug screen (hosp performed)     Status: None   Collection Time: 01/09/23  7:25 AM  Result Value Ref Range   Opiates NONE DETECTED NONE DETECTED   Cocaine NONE DETECTED NONE DETECTED   Benzodiazepines NONE DETECTED NONE DETECTED   Amphetamines NONE DETECTED NONE DETECTED   Tetrahydrocannabinol NONE DETECTED NONE DETECTED   Barbiturates NONE DETECTED NONE DETECTED    Comment: (NOTE) DRUG SCREEN FOR MEDICAL PURPOSES ONLY.  IF CONFIRMATION IS NEEDED FOR ANY PURPOSE, NOTIFY LAB WITHIN 5 DAYS.  LOWEST DETECTABLE LIMITS FOR URINE DRUG SCREEN Drug Class                     Cutoff (ng/mL) Amphetamine and metabolites    1000 Barbiturate and metabolites    200 Benzodiazepine                 200 Opiates and metabolites        300 Cocaine and metabolites        300 THC                            50 Performed at [facility], [address] [place], [place] [address]   Urinalysis, Routine w reflex microscopic -Urine, Clean Catch     Status: Abnormal   Collection Time: 01/09/23  7:25 AM  Result Value Ref Range   Color, Urine STRAW (A) YELLOW   APPearance CLEAR CLEAR   Specific Gravity,  Urine 1.006 1.005 - 1.030   pH 7.0 5.0 - 8.0   Glucose, UA NEGATIVE NEGATIVE mg/dL   Hgb urine dipstick MODERATE (A) NEGATIVE   Bilirubin Urine NEGATIVE NEGATIVE   Ketones, ur 20 (A) NEGATIVE mg/dL   Protein, ur NEGATIVE NEGATIVE mg/dL   Nitrite  NEGATIVE NEGATIVE   Leukocytes,Ua TRACE (A) NEGATIVE   RBC / HPF 6-10 0 - 5 RBC/hpf   WBC, UA 0-5 0 - 5 WBC/hpf   Bacteria, UA FEW (A) NONE SEEN   Squamous Epithelial / HPF 0-5 0 - 5 /HPF    Comment: Performed at [facility], [address], [place], [place] [address]  Basic metabolic panel     Status: Abnormal   Collection Time: 01/09/23 11:20 AM  Result Value Ref Range   Sodium 134 (L) 135 - 145 mmol/L   Potassium 3.3 (L) 3.5 - 5.1 mmol/L   Chloride 96 (L) 98 - 111 mmol/L   CO2 28 22 - 32 mmol/L   Glucose, Bld 95 70 - 99 mg/dL    Comment: Glucose reference range applies only to samples taken after fasting for at least 8 hours.   BUN 7 (L) 8 - 23 mg/dL   Creatinine, Ser 6.38 0.44 - 1.00 mg/dL   Calcium 8.6 (L) 8.9 - 10.3 mg/dL   GFR, Estimated >75 >64 mL/min    Comment: (NOTE) Calculated using the CKD-EPI Creatinine Equation (2021)    Anion gap 10 5 - 15    Comment: Performed at [facility], [address], [place], [place] [address]  Lithium level     Status: Abnormal   Collection Time: 01/09/23  1:45 PM  Result Value Ref Range   Lithium Lvl <0.06 (L) 0.60 - 1.20 mmol/L    Comment: Performed at [facility], [address], [place], [place] [address]    Blood Alcohol level:  Lab Results  Component Value Date   [facility] <10 01/08/2023   ETH <10 03/29/2022    Physical Findings:  CIWA:    COWS:     Musculoskeletal: Strength & Muscle Tone:  in the stretcher covered up Gait & Station:  in stretcher covered up Patient leans:  see above.  Psychiatric Specialty Exam:  Presentation  General Appearance:  Appropriate for Environment  Eye  Contact: Fleeting  Speech: Pressured  Speech Volume: Increased (nonsensical, yelling at provider accusing people of taking her plot of land.)  Handedness: Right   Mood and Affect  Mood: Angry; Irritable; Labile  Affect: Congruent; Labile; Tearful   Thought Process  Thought Processes: Disorganized  Descriptions of Associations:Tangential  Orientation:Partial  Thought Content:Illogical; Paranoid Ideation  History of Schizophrenia/Schizoaffective disorder:Yes  Duration of Psychotic Symptoms:Greater than six months  Hallucinations:Hallucinations: None  Ideas of Reference:Paranoia  Suicidal Thoughts:Suicidal Thoughts: No  Homicidal Thoughts:Homicidal Thoughts: No   Sensorium  Memory: Immediate Poor; Recent Poor; Remote Poor  Judgment: Impaired  Insight: Lacking   Executive Functions  Concentration: Poor  Attention Span: Poor  Recall: Poor  Fund of Knowledge: Poor  Language: Poor   Psychomotor Activity  Psychomotor Activity: Psychomotor Activity: Normal   Assets  Assets: Social Support   Sleep  Sleep:No data recorded   Physical Exam: Physical Exam-unable to obtain, does not allow anybody near her. ROS-Too Psychotic to allow staff come near her.  Refusing to engage. Blood pressure 104/73, pulse (!) 53, temperature 98.2 F (36.8 C), temperature source Axillary, resp. rate 18, height 5\' 4"  (1.626 m), weight 40.4 kg, SpO2 100 %. Body mass index is 15.29 kg/m.   Medical Decision Making: Continues to require inpatient Mental health care.  Will continue encourage acceptance of care and allowing staff help here.  Forced Medication protocol will be needed.  Problem 1: Paranoid Schizophrenia Disposition: Recommend psychiatric Inpatient admission when  medically cleared.   Earney Navy, NP-PMHNP-BC 01/10/2023, 1:06 PM

## 2023-01-10 NOTE — ED Notes (Signed)
Pt refuses covid swab. Pt states. "I do not live in a contaminated area" Pt states she has an attorney and does not have to receive any treatment she does not want. Julieanne Cotton, NP aware

## 2023-01-10 NOTE — ED Notes (Signed)
Pt continues to refuse vitals

## 2023-01-10 NOTE — ED Notes (Signed)
Josephine NP aware pt will not take meds, eat or drink. At present time continuing to encourage pt to eat and drink.

## 2023-01-10 NOTE — ED Notes (Signed)
Pt resting without s/s of distress, skin warm and dry with reg nonlabored resp.Marland Kitchen

## 2023-01-10 NOTE — ED Notes (Signed)
Pt refuse VS check RN is aware.

## 2023-01-11 DIAGNOSIS — F200 Paranoid schizophrenia: Secondary | ICD-10-CM | POA: Diagnosis not present

## 2023-01-11 DIAGNOSIS — E876 Hypokalemia: Secondary | ICD-10-CM | POA: Diagnosis not present

## 2023-01-11 DIAGNOSIS — R9431 Abnormal electrocardiogram [ECG] [EKG]: Secondary | ICD-10-CM | POA: Diagnosis not present

## 2023-01-11 NOTE — ED Notes (Signed)
Patient is resting in bed.  Breath sounds equal and unlabored.  Patient refused breakfast. Patient lab draw.

## 2023-01-11 NOTE — Progress Notes (Signed)
LCSW Progress Note  [id number]   [name]  01/11/2023  4:05 PM  Description:   Inpatient Psychiatric Referral  Patient was recommended inpatient per Phebe Colla, NP. There are no available beds at [facility] or [facility], per [facility] Malva Limes, RN. Patient was referred to the following out of network facilities:   Production assistant, radio Address Phone Fax  [facility]  [address] [place] [address] [phone]  [facility]  [address] [place] [place] [address] [phone]  [facility]  [address] [place] [address] [phone]  [facility] [address]  [address], [place] [address] [phone]  [facility]  [address] [place], [place] [address] [phone]  [facility]  [address] [place], [place] [address] [phone]  [facility]  [address] [place] [address] [phone]  [facility]  [address] [place], [place] [address] [phone]  [facility]  [address] [place] [address] [phone]  [facility]  [name], [place] [address] [phone]  [facility]  [address] [place] [address] [phone]  [facility]  [address], [place] [address] [phone]  [facility]  [address] [place] [address] [phone]  [facility]  [address] [place], [place] [address] [phone]  [facility]  [address] [place] [address] [phone]   [facility]  [address], [place] [address] [phone]  [facility]  [address], [place] [address] [phone]  [facility]  [address] [place], [place] [address] [phone]  [facility] [name] [place]  [address], [place] [address] [phone]  [facility] [place] [address] [phone]  [facility]  [address] [place] [address] [phone]  [facility]  [address] [place] [address] [phone]  [facility]  [address], [place] [address] [phone]  [facility]  [address], [place] [address] [phone]  [facility]  [address], [place] [address] [phone]  [facility]  [address] [place] [address] [phone]  [facility]  [address] [place] [address] [phone]  [facility]  [address] [place] [address] [phone]  [facility]  [address], [place] [address] [phone]  [facility]  [address] [place] [address] [phone]    Situation ongoing, CSW to continue following and update chart as more information becomes available.      Cathie Beams, [place]  01/11/2023 4:05 PM

## 2023-01-11 NOTE — Progress Notes (Signed)
[facility] Psych ED Progress Note  01/11/2023 3:18 PM [name]  MRN:  [id number]   Subjective:  [name] is a 62 y.o. female patient admitted with Previous hx of Paranoid Schizophrenia, Schizoaffective disorder Bipolar type brought in under IVC taken out by her legal Guardian. IVC Paper states that patient would not turn her AC on because the air is poison. She will not eating or drinking because her food is poisoned.   Patient is initially resting in bed when interview attempted.  Per nursing notes, patient has refused vital signs, lab draw, medications, food and drink for the last 24 hours.  When asked how she is feeling today, she sits up and yells "I want to go home.  My doctor is Dr Britt Bottom Blunt and you can't force my medical on me" and then immediately lays back down and pulls the blanket up.  When asked about her appetite and if she would like anything to eat, patient again sits up abruptly and screams "I'm a strap eater. Stop questioning me and let me go." She refused to answer any more questions.  Patient continues to need inpatient psychiatric hospitalization for safety and stabilization.   Principal Problem: Paranoid schizophrenia (HCC) Diagnosis:  Principal Problem:   Paranoid schizophrenia (HCC)   ED Assessment Time Calculation: Start Time: 1400 Stop Time: 1445 Total Time in Minutes (Assessment Completion): 45   Past Psychiatric History: Previous hx of Paranoid Schizophrenia, Schizoaffective disorder Bipolar type.  Multiple inpatient Psychiatry hospitalizations.  Last hospitalization was at [facility] in [facility] this year.  She is supposed to receive care at [facility] in [place] but I am not sure she utilizes the Clinic.    [place] Scale:  Flowsheet Row ED from 01/08/2023 in [facility] Department at [facility] Admission (Discharged) from 04/08/2022 in [facility]PATIENT BEHAVIORAL MEDICINE Admission (Discharged) from 10/17/2021 in [facility] [address] [facility]   C-SSRS RISK CATEGORY No Risk No Risk No Risk       Past Medical History:  Past Medical History:  Diagnosis Date   Anemia 10/02/2021   Non compliance w medication regimen    Schizophrenia [facility])     Past Surgical History:  Procedure Laterality Date   BIOPSY  09/25/2021   Procedure: BIOPSY;  Surgeon: Rachael Fee, MD;  Location: [facility];  Service: Gastroenterology;;   ESOPHAGOGASTRODUODENOSCOPY (EGD) WITH PROPOFOL [name] 09/25/2021   Procedure: ESOPHAGOGASTRODUODENOSCOPY (EGD) WITH PROPOFOL;  Surgeon: Rachael Fee, MD;  Location: [facility];  Service: Gastroenterology;  Laterality: [name];  With NGT placement   Family History: History reviewed. No pertinent family history. Social History:  Social History   Substance and Sexual Activity  Alcohol Use Not Currently   Comment: Refuses to disclose how much     Social History   Substance and Sexual Activity  Drug Use No   Comment: Refuses to answer    Social History   Socioeconomic History   Marital status: Single    Spouse name: Not on file   Number of children: Not on file   Years of education: Not on file   Highest education level: Not on file  Occupational History   Not on file  Tobacco Use   Smoking status: Every Day    Packs/day: 2    Types: Cigarettes    Passive exposure: Current   Smokeless tobacco: Never  Vaping Use   Vaping Use: Never used  Substance and Sexual Activity   Alcohol use: Not Currently    Comment: Refuses  to disclose how much   Drug use: No    Comment: Refuses to answer   Sexual activity: Not Currently    Comment: refused to answer  Other Topics Concern   Not on file  Social History Narrative   Not on file   Social Determinants of Health   Financial Resource Strain: Not on file  Food Insecurity: No Food Insecurity (04/08/2022)   Hunger Vital Sign    Worried About Running Out of Food in the Last Year: Never true    Ran Out of Food in the Last Year: Never true  Transportation  Needs: No Transportation Needs (04/08/2022)   PRAPARE - Administrator, Civil Service (Medical): No    Lack of Transportation (Non-Medical): No  Physical Activity: Not on file  Stress: Not on file  Social Connections: Not on file    Sleep: Good  Appetite:  Poor  Current Medications: Current Facility-Administered Medications  Medication Dose Route Frequency Provider Last Rate Last Admin   LORazepam (ATIVAN) tablet 1 mg  1 mg Oral Q8H PRN Welford Roche, Josephine C, NP       potassium chloride (KLOR-CON) packet 20 mEq  20 mEq Oral BID Randel Pigg, Dorma Russell, MD   20 mEq at 01/09/23 2156   Current Outpatient Medications  Medication Sig Dispense Refill   haloperidol (HALDOL) 5 MG tablet Take 1 tablet (5 mg total) by mouth 2 (two) times daily. 20 tablet 0   lithium carbonate 300 MG capsule Take 2 capsules (600 mg total) by mouth at bedtime. 20 capsule 0   mirtazapine (REMERON) 30 MG tablet Take 1 tablet (30 mg total) by mouth at bedtime. 10 tablet 0   Multiple Vitamin (MULTIVITAMIN WITH MINERALS) TABS tablet Take 1 tablet by mouth daily. 10 tablet 0   nicotine (NICODERM CQ - DOSED IN MG/24 HR) 7 mg/24hr patch Place 1 patch (7 mg total) onto the skin daily. (Remove old patch before applying new patch). 14 patch 0   OLANZapine (ZYPREXA) 15 MG tablet Take 2 tablets (30 mg total) by mouth at bedtime. 20 tablet 0   QUEtiapine (SEROQUEL) 400 MG tablet Take 1 tablet (400 mg total) by mouth at bedtime. 10 tablet 0   thiamine (VITAMIN B1) 100 MG tablet Take 1 tablet (100 mg total) by mouth daily. 10 tablet 0    Lab Results: No results found for this or any previous visit (from the past 48 hour(s)).  Blood Alcohol level:  Lab Results  Component Value Date   ETH <10 01/08/2023   ETH <10 03/29/2022     Musculoskeletal: Strength & Muscle Tone: within normal limits Gait & Station:  Assessed lying in bed Patient leans:  Assessed lying in bed  Psychiatric Specialty Exam:  Presentation   General Appearance:  Appropriate for Environment  Eye Contact: Absent  Speech: Clear and Coherent  Speech Volume: Increased  Handedness: Right   Mood and Affect  Mood: Angry; Irritable  Affect: Inappropriate   Thought Process  Thought Processes: Disorganized  Descriptions of Associations:Loose  Orientation:Partial  Thought Content:Illogical  History of Schizophrenia/Schizoaffective disorder:Yes  Duration of Psychotic Symptoms:Greater than six months  Hallucinations:Hallucinations: Other (comment) (Patient refused to answer)  Ideas of Reference:Other (comment) (Patient refused to answer)  Suicidal Thoughts:Suicidal Thoughts: -- (Patient refused to answer)  Homicidal Thoughts:Homicidal Thoughts: -- (Patient refused to answer)   Sensorium  Memory: Other (comment) (Patient refused to answer)  Judgment: Impaired  Insight: Lacking   Executive Functions  Concentration: Poor  Attention  Span: Fair  Recall: [employer] of Knowledge: Fair  Language: Fair   Psychomotor Activity  Psychomotor Activity: Psychomotor Activity: Normal   Assets  Assets: Social Support   Sleep  Sleep: Sleep: Fair    Physical Exam: Physical Exam Vitals and nursing note reviewed.  HENT:     Head: Normocephalic.  Eyes:     Pupils: Pupils are equal, round, and reactive to light.  Neurological:     Mental Status: She is alert and oriented to person, place, and time.    Review of Systems  Unable to perform ROS: Acuity of condition (Patient is too psychotic for assessment)  All other systems reviewed and are negative.  Blood pressure 104/60, pulse 66, temperature 98.3 F (36.8 C), temperature source Oral, resp. rate 16, height 5\' 4"  (1.626 m), weight 40.4 kg, SpO2 91 %. Body mass index is 15.29 kg/m.   Medical Decision Making: Patient case reviewed and discussed with Dr Lucianne Muss.  Patient continues to refuse nutrition, fluid and medication.  She  requires inpatient psychiatric hospitalization for safety and stabilization.   Problem 1: Paranoid Schizophrenia -Referral to Inpatient psychiatric hospital     Thomes Lolling, NP 01/11/2023, 3:18 PM

## 2023-01-11 NOTE — Progress Notes (Signed)
This CSW followed up with [facility] on pt's [facility] referral and spoke with Cassandra, Intake who informed this CSW that pt has utilized all of her Medicare days for this year for inpatient hospitalization that will be covered by Medicare. This CSW/ Disposition team will continue to assist with inpatient behavioral health placement.   [name], MSW, [facility] 01/11/2023 9:44 PM

## 2023-01-11 NOTE — ED Notes (Signed)
Patient continues to sleep and rest. Patient refusing care.  Patient refusing to eat or drink. Patient continues to state, "I want to go home".

## 2023-01-11 NOTE — Progress Notes (Addendum)
[facility]  PROGRESS NOTE  [name] ZOX:[id number] DOB: 03-15-1961 DOA: 01/08/2023 [facility]: [facility]   Brief HPI:   62 year old female with medical history of schizophrenia, paranoia, malnutrition was admitted to hospital for evaluation for acute psychosis.  Patient was brought by her sister, IVC was done as patient was not eating and drinking at home.  Patient paranoid and states that food is poisoned and, did not turn the [facility] on as send air was placed on. TRH consulted for treatment for hypokalemia, prolonged QTc    Assessment/Plan:    Hypokalemia -In setting of poor p.o. intake, due to severe psychosis and paranoia -Started on scheduled potassium supplementation, potassium is improved to 3.3 this morning -Potassium packets to be dissolved in water as patient is refusing pills -Since patient is refusing food and drink -Refused labs this morning, last potassium was 3.3 on 01/09/2023  Prolonged QTc -Chronic likely due to antipsychotics -Serum magnesium was normal at 2.5 -EKG ordered this morning, not done as patient has been refusing treatment   Will continue to follow   Medications     potassium chloride  20 mEq Oral BID     Data Reviewed:   CBG:  No results for input(s): "GLUCAP" in the last 168 hours.  SpO2: 91 %    Vitals:   01/09/23 1500 01/09/23 2121 01/10/23 0639 01/11/23 0657  BP: 134/72 100/72 104/73 104/60  Pulse: 87 (!) 50 (!) 53 66  Resp: 18 18 18 16   Temp:  97.8 F (36.6 C) 98.2 F (36.8 C) 98.3 F (36.8 C)  TempSrc:  Oral Axillary Oral  SpO2: 95% 100% 100% 91%  Weight:      Height:          Data Reviewed:  Basic Metabolic Panel: Recent Labs  Lab 01/08/23 1900 01/08/23 2308 01/09/23 0500 01/09/23 1120  NA 139 138 137 134*  K 2.3* 2.3* 3.0* 3.3*  CL 100 101 100 96*  CO2 29 29 25 28   GLUCOSE 92 87 100* 95  BUN 17 14 11  7*  CREATININE 1.17* 0.81 0.78 0.74  CALCIUM 8.6* 8.2* 8.6* 8.6*  MG 2.5*  --   --   --      CBC: Recent Labs  Lab 01/08/23 1900  WBC 5.1  NEUTROABS 4.0  HGB 12.7  HCT 40.0  MCV 86.4  PLT 207    LFT Recent Labs  Lab 01/08/23 1900  AST 37  ALT 17  ALKPHOS 51  BILITOT 0.6  PROT 7.6  ALBUMIN 4.3     Antibiotics: Anti-infectives (From admission, onward)    None             Subjective   Denies any complaints.  Wants to go home.  Has been refusing labs and food and drink.  Not taking medications.  EKG ordered this morning, not done yet.   Objective    Physical Examination:   Appears agitated Did not want to be examined  Status is: Inpatient:             Meredeth Ide   [facility] If 7PM-7AM, please contact night-coverage at [email or web address], Office  [phone]   01/11/2023, 4:05 PM  LOS: 0 days

## 2023-01-11 NOTE — ED Notes (Signed)
Patient refused VS tonight. Will try again later.

## 2023-01-12 DIAGNOSIS — F200 Paranoid schizophrenia: Secondary | ICD-10-CM

## 2023-01-12 DIAGNOSIS — N179 Acute kidney failure, unspecified: Secondary | ICD-10-CM

## 2023-01-12 DIAGNOSIS — E876 Hypokalemia: Secondary | ICD-10-CM | POA: Diagnosis not present

## 2023-01-12 NOTE — ED Notes (Signed)
Patient resting in the bed

## 2023-01-12 NOTE — Progress Notes (Signed)
  Progress Note   Patient: [name] ZOX:[id number] DOB: [birth year] DOA: 01/08/2023     0 DOS: the patient was seen and examined on 01/12/2023   Brief hospital course: 62 year old female with medical history of schizophrenia, paranoia, malnutrition was admitted to hospital for evaluation for acute psychosis.  Patient was brought by her sister, IVC was done as patient was not eating and drinking at home.  Patient paranoid and states that food is poisoned and, did not turn the [facility] on as send air was placed on. TRH consulted for treatment for hypokalemia, prolonged QTc  Assessment and Plan: Hypokalemia -In setting of poor p.o. intake, due to severe psychosis and paranoia -Started on scheduled potassium supplementation,  -Most recent K found to be 3.3 on 6/22 -Potassium packets to be dissolved in water as patient is refusing pills -Since patient is refusing food and drink -Pt cont to refuse treatment and even meds and food. Psych following -Continue to encourage   Prolonged QTc -Chronic likely due to antipsychotics -Serum magnesium was normal at 2.5 -EKG ordered this morning, not done as patient has been refusing treatment   Paranoid schezophrenia -Psych following -Awaiting inpatient psych admission   Will continue to follow   Subjective: Refuses exam. Repeats "Please go home"  Physical Exam: Vitals:   01/09/23 1500 01/09/23 2121 01/10/23 0639 01/11/23 0657  BP: 134/72 100/72 104/73 104/60  Pulse: 87 (!) 50 (!) 53 66  Resp: 18 18 18 16   Temp:  97.8 F (36.6 C) 98.2 F (36.8 C) 98.3 F (36.8 C)  TempSrc:  Oral Axillary Oral  SpO2: 95% 100% 100% 91%  Weight:      Height:       General exam: Awake, laying in bed, in nad Respiratory system: Normal respiratory effort, no audible wheezing Cardiovascular system: perfused, pt refused auscultation Gastrointestinal system: Soft, nondistended Central nervous system: no seizures or tremors Extremities: no obvious joint deformities,  no clubbing Skin: no notable skin lesions seen Psychiatry: Pt refusing majority of exam, unable to assess  Data Reviewed:  There are no new results to review at this time.  Family Communication: Pt in room, family not at bedside  Disposition: Awaiting inpt psych   Planned Discharge Destination:  Inpt psych    Author: Rickey Barbara, MD 01/12/2023 2:25 PM  For on call review [email or web address].

## 2023-01-12 NOTE — Hospital Course (Signed)
62 year old female with medical history of schizophrenia, paranoia, malnutrition was admitted to hospital for evaluation for acute psychosis.  Patient was brought by her sister, IVC was done as patient was not eating and drinking at home.  Patient paranoid and states that food is poisoned and, did not turn the [facility] on as send air was placed on. TRH consulted for treatment for hypokalemia, prolonged QTc

## 2023-01-12 NOTE — Progress Notes (Addendum)
ADDENDUM  11:15 AM - CSW spoke with April, transfer coordinator, at [facility] via phone call. April confirmed [facility] is [facility] with [facility] and has coverage for inpatient treatment. April advised CSW to complete paperwork and fax referral to [facility] for review. CSW requested labs and covid be completed. Per Julieanne Cotton, NP, pt continues to refuse labs, vitals, and food/fluids.  10:29 AM - CSW attempted to speak with April, transfer coordinator, at [facility] via phone call to inquire about pt's [facility] benefit coverage. CSW left a voicemail requesting a call back to discuss pt. CSW will await follow up.  Cathie Beams, [place]  01/12/2023 10:46 AM

## 2023-01-12 NOTE — ED Notes (Signed)
Pt is still at this time refusing to have vitals taken or take medication.

## 2023-01-12 NOTE — Progress Notes (Cosign Needed Addendum)
[facility] Psych ED Progress Note  01/12/2023 1:13 PM [name]  MRN:  [id number]   Subjective:  [name] is a 62 y.o. female patient admitted with Previous hx of Paranoid Schizophrenia, Schizoaffective disorder Bipolar type brought in under IVC taken out by her legal Guardian.  IVC Paper states that patient would not turn her AC on because the air is poison.  She will not eating or drinking because her food is poisoned.  Patient continues to need inpatient Psychiatry hospitalization.  Patient remains paranoid refusing Medications, meals and any kind of Liquid.   Patient is covered from head to toe with blanket.  Patient yelled at provider to send her home and laye back down covered up.  Patient is refusing V/S,Food and drinks.  She is refusing lab work  as well.  Patient continues to need inpatient Psychiatry hospitalization. Principal Problem: Paranoid schizophrenia (HCC) Diagnosis:  Principal Problem:   Paranoid schizophrenia [facility])   ED Assessment Time Calculation: Start Time: 1222 Stop Time: 1238 Total Time in Minutes (Assessment Completion): 16   Past Psychiatric History: see initial Psychiatry evaluation note  [place] Scale:  Flowsheet Row ED from 01/08/2023 in [facility] Emergency Department at [facility] Admission (Discharged) from 04/08/2022 in [facility] INPATIENT BEHAVIORAL MEDICINE Admission (Discharged) from 10/17/2021 in [facility] BEHAVIORAL MEDICINE  C-SSRS RISK CATEGORY No Risk No Risk No Risk       Past Medical History:  Past Medical History:  Diagnosis Date   Anemia 10/02/2021   Non compliance w medication regimen    Schizophrenia [facility])     Past Surgical History:  Procedure Laterality Date   BIOPSY  09/25/2021   Procedure: BIOPSY;  Surgeon: Rachael Fee, MD;  Location: [facility];  Service: Gastroenterology;;   ESOPHAGOGASTRODUODENOSCOPY (EGD) WITH PROPOFOL [name] 09/25/2021   Procedure: ESOPHAGOGASTRODUODENOSCOPY (EGD) WITH PROPOFOL;  Surgeon: Rachael Fee, MD;  Location: [facility];  Service: Gastroenterology;  Laterality: [name];  With NGT placement   Family History: History reviewed. No pertinent family history. Family Psychiatric  History: see initial Psychiatry evaluation note Social History:  Social History   Substance and Sexual Activity  Alcohol Use Not Currently   Comment: Refuses to disclose how much     Social History   Substance and Sexual Activity  Drug Use No   Comment: Refuses to answer    Social History   Socioeconomic History   Marital status: Single    Spouse name: Not on file   Number of children: Not on file   Years of education: Not on file   Highest education level: Not on file  Occupational History   Not on file  Tobacco Use   Smoking status: Every Day    Packs/day: 2    Types: Cigarettes    Passive exposure: Current   Smokeless tobacco: Never  Vaping Use   Vaping Use: Never used  Substance and Sexual Activity   Alcohol use: Not Currently    Comment: Refuses to disclose how much   Drug use: No    Comment: Refuses to answer   Sexual activity: Not Currently    Comment: refused to answer  Other Topics Concern   Not on file  Social History Narrative   Not on file   Social Determinants of Health   Financial Resource Strain: Not on file  Food Insecurity: No Food Insecurity (04/08/2022)   Hunger Vital Sign    Worried About Running Out of Food in the Last Year: Never true  Ran Out of Food in the Last Year: Never true  Transportation Needs: No Transportation Needs (04/08/2022)   PRAPARE - Administrator, Civil Service (Medical): No    Lack of Transportation (Non-Medical): No  Physical Activity: Not on file  Stress: Not on file  Social Connections: Not on file    Sleep: Fair  Appetite:  Poor  Current Medications: Current Facility-Administered Medications  Medication Dose Route Frequency Provider Last Rate Last Admin   LORazepam (ATIVAN) tablet 1 mg  1 mg Oral Q8H PRN  Welford Roche, Kynsli Haapala C, NP       potassium chloride (KLOR-CON) packet 20 mEq  20 mEq Oral BID Randel Pigg, Dorma Russell, MD   20 mEq at 01/09/23 2156   Current Outpatient Medications  Medication Sig Dispense Refill   haloperidol (HALDOL) 5 MG tablet Take 1 tablet (5 mg total) by mouth 2 (two) times daily. 20 tablet 0   lithium carbonate 300 MG capsule Take 2 capsules (600 mg total) by mouth at bedtime. 20 capsule 0   mirtazapine (REMERON) 30 MG tablet Take 1 tablet (30 mg total) by mouth at bedtime. 10 tablet 0   Multiple Vitamin (MULTIVITAMIN WITH MINERALS) TABS tablet Take 1 tablet by mouth daily. 10 tablet 0   nicotine (NICODERM CQ - DOSED IN MG/24 HR) 7 mg/24hr patch Place 1 patch (7 mg total) onto the skin daily. (Remove old patch before applying new patch). 14 patch 0   OLANZapine (ZYPREXA) 15 MG tablet Take 2 tablets (30 mg total) by mouth at bedtime. 20 tablet 0   QUEtiapine (SEROQUEL) 400 MG tablet Take 1 tablet (400 mg total) by mouth at bedtime. 10 tablet 0   thiamine (VITAMIN B1) 100 MG tablet Take 1 tablet (100 mg total) by mouth daily. 10 tablet 0    Lab Results:  No results found for this or any previous visit (from the past 48 hour(s)).   Blood Alcohol level:  Lab Results  Component Value Date   ETH <10 01/08/2023   ETH <10 03/29/2022    Physical Findings:  CIWA:    COWS:     Musculoskeletal: Strength & Muscle Tone:  in the stretcher covered up Gait & Station:  in stretcher covered up Patient leans:  see above.  Psychiatric Specialty Exam:  Presentation  General Appearance:  Casual; Disheveled  Eye Contact: None  Speech: Clear and Coherent  Speech Volume: Increased (Yells out do be left alone and be discharged home.)  Handedness: Right   Mood and Affect  Mood: Angry; Irritable  Affect: Congruent; Labile   Thought Process  Thought Processes: Disorganized  Descriptions of Associations:Tangential  Orientation:-- (unable to obtain- does not want  to engage in assessment)  Thought Content:Paranoid Ideation (does not want to engage in assessment.  Yells out at provider to leave her alone.)  History of Schizophrenia/Schizoaffective disorder:Yes  Duration of Psychotic Symptoms:Greater than six months  Hallucinations:Hallucinations: None  Ideas of Reference:None  Suicidal Thoughts:Suicidal Thoughts: -- (unable to obtain)  Homicidal Thoughts:Homicidal Thoughts: -- (unable to obtain)   Sensorium  Memory: Other (comment) (unable to obtain)  Judgment: Impaired  Insight: Lacking   Executive Functions  Concentration: Poor  Attention Span: Poor  Recall: Poor  Fund of Knowledge: Poor  Language: Fair   Psychomotor Activity  Psychomotor Activity: Psychomotor Activity: Normal   Assets  Assets: Housing   Sleep  Sleep:Sleep: Good   Physical Exam: Physical Exam-unable to obtain, does not allow anybody near her. ROS-Too Psychotic to allow  staff come near her.  Refusing to engage. Blood pressure 104/60, pulse 66, temperature 98.3 F (36.8 C), temperature source Oral, resp. rate 16, height 5\' 4"  (1.626 m), weight 40.4 kg, SpO2 91 %. Body mass index is 15.29 kg/m.   Medical Decision Making: Continues to require inpatient Mental health care.  Patient is not eating, drinking or allowing any contact.  We will continue seek bed placement.  Problem 1: Paranoid Schizophrenia Disposition: Recommend psychiatric Inpatient admission when medically cleared.   Earney Navy, NP-PMHNP-BC 01/12/2023, 1:13 PM

## 2023-01-12 NOTE — Progress Notes (Signed)
12:34 PM - CSW spoke with April, transfer coordinator, at [facility] via phone call. CSW provided April with update regarding pt refusing to complete labs and vitals. April reports she spoke with [facility] provider regarding pt refusal. [facility] cannot accept pt without labwork and negative covid results. CSW will continue to assist and follow with placement. Pt refusing labs and vitals is an identified barrier for placement.  Cathie Beams, [place]  01/12/2023 1:07 PM

## 2023-01-12 NOTE — ED Notes (Signed)
Pt is refusing to be touched and refusing vitals. Pt states, "I just want to be left alone, go away, no vitals."

## 2023-01-13 LAB — COMPREHENSIVE METABOLIC PANEL
ALT: 13 U/L (ref 0–44)
AST: 21 U/L (ref 15–41)
Albumin: 4.7 g/dL (ref 3.5–5.0)
Alkaline Phosphatase: 50 U/L (ref 38–126)
Anion gap: 22 — ABNORMAL HIGH (ref 5–15)
BUN: 13 mg/dL (ref 8–23)
CO2: 19 mmol/L — ABNORMAL LOW (ref 22–32)
Calcium: 9.4 mg/dL (ref 8.9–10.3)
Chloride: 96 mmol/L — ABNORMAL LOW (ref 98–111)
Creatinine, Ser: 0.89 mg/dL (ref 0.44–1.00)
GFR, Estimated: >60 mL/min (ref >60)
Glucose, Bld: 90 mg/dL (ref 70–99)
Potassium: 3.5 mmol/L (ref 3.5–5.1)
Sodium: 137 mmol/L (ref 135–145)
Total Bilirubin: 1.3 mg/dL — ABNORMAL HIGH (ref 0.3–1.2)
Total Protein: 8.3 g/dL — ABNORMAL HIGH (ref 6.5–8.1)

## 2023-01-13 LAB — SARS CORONAVIRUS 2 BY RT PCR: SARS Coronavirus 2 by RT PCR: NEGATIVE

## 2023-01-13 NOTE — Progress Notes (Cosign Needed Addendum)
[facility] Psych ED Progress Note  01/13/2023 11:25 AM [name]  MRN:  [id number]   Subjective:  [name] is a 62yr old female admitted with a history of paranoid schizophrenia, schizoeffective disorder bipolar type brought to the ED under IVC taken out by her legal guardian.  IVC states she will not use her air conditioning because she believes the air is poisoned and she will not eat or drink because she believes it has been poisoned.   Patient continues to require inpatient psychiatric hospitalization.  She remains paranoid refusing all medication, food and fluids; she has had no intake or output for 3 days.  Her face appears gaunt.  Patient refuses to participate in any assessment.  If spoken to, she yells "I want to go home!"  Patient refusing vital signs, but did consent to lab work this AM after a show of force. She sat in a chair and allowed the lab collection. Patient continues to need inpatient psychiatric hospitalization.    Principal Problem: Paranoid schizophrenia (HCC) Diagnosis:  Principal Problem:   Paranoid schizophrenia [facility])   ED Assessment Time Calculation: Start Time: 1030 Stop Time: 1125 Total Time in Minutes (Assessment Completion): 55   Past Psychiatric History: Previous hx of Paranoid Schizophrenia, Schizoaffective disorder Bipolar type.  Multiple inpatient Psychiatry hospitalizations.  Last hospitalization was at [facility] in January this year.    [place] Scale:  Flowsheet Row ED from 01/08/2023 in [facility] Emergency Department at [facility] Admission (Discharged) from 04/08/2022 in [facility] INPATIENT BEHAVIORAL MEDICINE Admission (Discharged) from 10/17/2021 in [facility] BEHAVIORAL MEDICINE  C-SSRS RISK CATEGORY No Risk No Risk No Risk       Past Medical History:  Past Medical History:  Diagnosis Date   Anemia 10/02/2021   Non compliance w medication regimen    Schizophrenia [facility])     Past Surgical History:  Procedure Laterality Date   BIOPSY   09/25/2021   Procedure: BIOPSY;  Surgeon: Rachael Fee, MD;  Location: [facility];  Service: Gastroenterology;;   ESOPHAGOGASTRODUODENOSCOPY (EGD) WITH PROPOFOL [name] 09/25/2021   Procedure: ESOPHAGOGASTRODUODENOSCOPY (EGD) WITH PROPOFOL;  Surgeon: Rachael Fee, MD;  Location: [facility];  Service: Gastroenterology;  Laterality: [name];  With NGT placement   Family History: History reviewed. No pertinent family history. Family Psychiatric  History: Unable to assess Social History:  Social History   Substance and Sexual Activity  Alcohol Use Not Currently   Comment: Refuses to disclose how much     Social History   Substance and Sexual Activity  Drug Use No   Comment: Refuses to answer    Social History   Socioeconomic History   Marital status: Single    Spouse name: Not on file   Number of children: Not on file   Years of education: Not on file   Highest education level: Not on file  Occupational History   Not on file  Tobacco Use   Smoking status: Every Day    Packs/day: 2    Types: Cigarettes    Passive exposure: Current   Smokeless tobacco: Never  Vaping Use   Vaping Use: Never used  Substance and Sexual Activity   Alcohol use: Not Currently    Comment: Refuses to disclose how much   Drug use: No    Comment: Refuses to answer   Sexual activity: Not Currently    Comment: refused to answer  Other Topics Concern   Not on file  Social History Narrative   Not on  file   Social Determinants of Health   Financial Resource Strain: Not on file  Food Insecurity: No Food Insecurity (04/08/2022)   Hunger Vital Sign    Worried About Running Out of Food in the Last Year: Never true    Ran Out of Food in the Last Year: Never true  Transportation Needs: No Transportation Needs (04/08/2022)   PRAPARE - Administrator, Civil Service (Medical): No    Lack of Transportation (Non-Medical): No  Physical Activity: Not on file  Stress: Not on file  Social  Connections: Not on file    Sleep: Good  Appetite:  Negative  Current Medications: Current Facility-Administered Medications  Medication Dose Route Frequency Provider Last Rate Last Admin   LORazepam (ATIVAN) tablet 1 mg  1 mg Oral Q8H PRN Welford Roche, Josephine C, NP       potassium chloride (KLOR-CON) packet 20 mEq  20 mEq Oral BID Randel Pigg, Dorma Russell, MD   20 mEq at 01/09/23 2156   Current Outpatient Medications  Medication Sig Dispense Refill   haloperidol (HALDOL) 5 MG tablet Take 1 tablet (5 mg total) by mouth 2 (two) times daily. 20 tablet 0   lithium carbonate 300 MG capsule Take 2 capsules (600 mg total) by mouth at bedtime. 20 capsule 0   mirtazapine (REMERON) 30 MG tablet Take 1 tablet (30 mg total) by mouth at bedtime. 10 tablet 0   Multiple Vitamin (MULTIVITAMIN WITH MINERALS) TABS tablet Take 1 tablet by mouth daily. 10 tablet 0   nicotine (NICODERM CQ - DOSED IN MG/24 HR) 7 mg/24hr patch Place 1 patch (7 mg total) onto the skin daily. (Remove old patch before applying new patch). 14 patch 0   OLANZapine (ZYPREXA) 15 MG tablet Take 2 tablets (30 mg total) by mouth at bedtime. 20 tablet 0   QUEtiapine (SEROQUEL) 400 MG tablet Take 1 tablet (400 mg total) by mouth at bedtime. 10 tablet 0   thiamine (VITAMIN B1) 100 MG tablet Take 1 tablet (100 mg total) by mouth daily. 10 tablet 0    Lab Results:  Results for orders placed or performed during the hospital encounter of 01/08/23 (from the past 48 hour(s))  Comprehensive metabolic panel     Status: Abnormal   Collection Time: 01/13/23  9:51 AM  Result Value Ref Range   Sodium 137 135 - 145 mmol/L    Comment: ELECTROLYTES REPEATED TO VERIFY   Potassium 3.5 3.5 - 5.1 mmol/L   Chloride 96 (L) 98 - 111 mmol/L    Comment: ELECTROLYTES REPEATED TO VERIFY   CO2 19 (L) 22 - 32 mmol/L    Comment: ELECTROLYTES REPEATED TO VERIFY   Glucose, Bld 90 70 - 99 mg/dL    Comment: Glucose reference range applies only to samples taken after  fasting for at least 8 hours.   BUN 13 8 - 23 mg/dL   Creatinine, Ser 1.61 0.44 - 1.00 mg/dL   Calcium 9.4 8.9 - 09.6 mg/dL   Total Protein 8.3 (H) 6.5 - 8.1 g/dL   Albumin 4.7 3.5 - 5.0 g/dL   AST 21 15 - 41 U/L   ALT 13 0 - 44 U/L   Alkaline Phosphatase 50 38 - 126 U/L   Total Bilirubin 1.3 (H) 0.3 - 1.2 mg/dL   GFR, Estimated >04 >54 mL/min    Comment: (NOTE) Calculated using the CKD-EPI Creatinine Equation (2021)    Anion gap 22 (H) 5 - 15    Comment: ELECTROLYTES REPEATED  TO VERIFY Performed at [facility], [address] [place], [place] [address]     Blood Alcohol level:  Lab Results  Component Value Date   [facility] <10 01/08/2023   ETH <10 03/29/2022    Physical Findings:  CIWA:    COWS:     Musculoskeletal: Strength & Muscle Tone: within normal limits Gait & Station:  Unable to assess, patient would not stand Patient leans:  Unable to assess, patient would not stand  Psychiatric Specialty Exam:  Presentation  General Appearance:  Disheveled  Eye Contact: Absent  Speech: Clear and Coherent  Speech Volume: Increased  Handedness: Right   Mood and Affect  Mood: Angry; Irritable; Labile  Affect: Congruent; Labile   Thought Process  Thought Processes: -- (Unable to assess)  Descriptions of Associations:-- (Unable to assess due)  Orientation:Other (comment) (Unable to assess)  Thought Content:Illogical; Paranoid Ideation  History of Schizophrenia/Schizoaffective disorder:Yes  Duration of Psychotic Symptoms:Greater than six months  Hallucinations:Hallucinations: Other (comment) (Unable to assess)  Ideas of Reference:Paranoia  Suicidal Thoughts:Suicidal Thoughts: -- (Unable to assess)  Homicidal Thoughts:Homicidal Thoughts: -- (Unable to assess)   Sensorium  Memory: Other (comment) (Unable to assess)  Judgment: Impaired  Insight: Lacking   Art therapist  Concentration: Fair  Attention  Span: Fair  Recall: [employer] of Knowledge: Fair  Language: Fair   Psychomotor Activity  Psychomotor Activity: Psychomotor Activity: Normal   Assets  Assets: Leisure Time   Sleep  Sleep: Sleep: Good    Physical Exam: Physical Exam Nursing note reviewed. Vitals reviewed: Patient is refusing vital signs. Constitutional:      Comments: Patient appears gaunt and dry  Eyes:     Pupils: Pupils are equal, round, and reactive to light.  Skin:    General: Skin is dry.  Neurological:     Comments: Unable to assess    Review of Systems  Constitutional:  Positive for weight loss.  Psychiatric/Behavioral:         Patient is paranoid. She is refusing to eat, drink or take medications.   Blood pressure 104/68, pulse 70, temperature 98 F (36.7 C), temperature source Oral, resp. rate 18, height 5\' 4"  (1.626 m), weight 40.4 kg, SpO2 97 %. Body mass index is 15.29 kg/m.   Medical Decision Making: Patient case reviewed and discussed with Dr Lucianne Muss.  Patient remains paranoid refusing all food, fluid and medication.  She has had no documented intake or output for 3 days.  She requires inpatient psychiatric hospitalization.    Problem 1: Paranoid Schizophrenia -Refer to inpatient psychiatric hospital   Thomes Lolling, NP 01/13/2023, 11:25 AM

## 2023-01-13 NOTE — ED Notes (Signed)
Pt  refusing to take routine medications and is very difficult to communicate with due to internal dialogue with self.

## 2023-01-13 NOTE — Progress Notes (Signed)
This CSW spoke with Vernona Rieger, care coordinator with [facility] [phone] who informed that there is open beds and pt can be reviewed. This CSW sent full BH referral, Labs, and IVC paperwork. CSW [place], LCSW to send [facility] Transfer form and transport form.   This CSW will start the process of trying to obtain physician to complete transport form.   [name], MSW, [facility] 01/13/2023 12:13 PM

## 2023-01-13 NOTE — ED Notes (Signed)
Pt continues to refuse interaction with others and becomes upset as noted by loud swearing at staff, with any attempt to verbally communicate with her. Pt did cooperate with staff when vital signs were obtained.

## 2023-01-13 NOTE — ED Provider Notes (Signed)
Emergency Medicine Observation Re-evaluation Note  [name] is a 62 y.o. female, seen on rounds today.  Pt initially presented to the ED for complaints of IVC Currently, the patient is awaiting inpatient psychiatric placement.  But patient is also being followed by internal medicine hospitalist service on a daily basis for prolonged QT and electrolyte abnormalities.  Patient's potassium June 22 was 3.3.  Lithium level was less than 0.06.  Patient is refusing any labs this morning.  Physical Exam  BP 104/68 (BP Location: Right Arm)   Pulse 70   Temp 98 F (36.7 C) (Oral)   Resp 18   Ht 1.626 m (5\' 4" )   Wt 40.4 kg   SpO2 97%   BMI 15.29 kg/m  Physical Exam General: Patient is pacing in the room.  Not being cooperative.  Not allowing exam. Cardiac:  Lungs:  Psych: Pacing not being cooperative  ED Course / MDM  EKG:EKG Interpretation  Date/Time:  Saturday January 09 2023 11:21:17 EDT Ventricular Rate:  46 PR Interval:  122 QRS Duration: 89 QT Interval:  641 QTC Calculation: 561 R Axis:   27 Text Interpretation: Sinus bradycardia Abnrm T, consider ischemia, anterolateral lds Prolonged QT interval similar to yesterday T wave changes are similar to multiple priors, but prolonged QT is still prolonged as it was yesterday Reconfirmed by Pricilla Loveless [phone]) on 01/09/2023 11:57:20 AM  I have reviewed the labs performed to date as well as medications administered while in observation.  Recent changes in the last 24 hours include being seen daily by internal medicine hospitalist.  Not clear why they are seeing patient on a daily basis but their notes also they will follow.  Patient today refusing any kind of labs.  Will have ED nurse check with behavioral health to see why and internal medicine is involved.  Because patient's not an inpatient.  Awaiting inpatient behavioral health placement.  Plan  Current plan is for inpatient behavioral health placement.  Patient also being followed  daily by internal medicine.    Vanetta Mulders, MD 01/13/23 [phone]

## 2023-01-13 NOTE — Progress Notes (Addendum)
ADDENDUM  3:26 PM - CSW received phone call from Vernona Rieger, Clinical cytogeneticist, at [facility]. Vernona Rieger reports that [facility] is now on mental health capacity. Vernona Rieger advised CSW to call tomorrow morning to check about bed availability. CSW to follow up tomorrow morning.  2:41 PM - CSW attempted to speak with [facility] transfer coordinator, Vernona Rieger, via phone call. CSW left a voicemail requesting a call back regarding pt's referral status. CSW will await follow up.  1:35 PM - CSW completed [facility] Transfer form and [id number] transportation consent and faxed to [facility] [phone] for review. CSW will await follow up.  Cathie Beams, [place]  01/13/2023 1:37 PM

## 2023-01-13 NOTE — ED Notes (Signed)
Pt refused for temp to be taken 

## 2023-01-13 NOTE — Progress Notes (Signed)
[facility] has been following this patient as consult for hypokalemia.  Most recent potassium was 3.3 on 01/09/2023 and patient has been on supplemental potassium which she continues to refuse.  She has refused blood work ordered every morning by our team.  She refuses to talk to [facility] team and refuses examination as well.  She is awaiting placement at inpatient psychiatric facility and is being followed by psychiatry team.  The ED team can order BMP for her if she agrees.  TR[facility] team will sign off and please reconsult [place] if she is agreeable for blood work or if she has significant hypokalemia on blood work or if significant medical issues arise.

## 2023-01-14 MED ORDER — LORAZEPAM 2 MG/ML IJ SOLN: 2.0000 mg | Freq: Three times a day (TID) | INTRAMUSCULAR | Status: DC | PRN

## 2023-01-14 MED ORDER — LORAZEPAM 1 MG PO TABS: 1.0000 mg | ORAL_TABLET | Freq: Three times a day (TID) | ORAL | Status: DC | PRN

## 2023-01-14 NOTE — Progress Notes (Signed)
3:19 PM - CSW spoke with Para March, intake coordinator, at [facility] via phone call. Para March reports [facility] is willing to accept pt for tonight or tomorrow morning, pending IVC paperwork faxed to [phone]. CSW sent IVC paperwork via EPIC fax. CSW to await follow up.  Cathie Beams, [place]  01/14/2023 3:35 PM

## 2023-01-14 NOTE — Progress Notes (Signed)
This CSW spoke with Para March, intake coordinator, at [facility] who informed that IVC paperwork MUST be renewed due to exp date of TOMORROW 01/15/23. [facility] will accept pt fully until IVC paperwork is updated and faxed along with negative COVID Fax#[phone].   Care Team notified: Alona Bene, PMHNP, [name] Babe, RN, and [place], LCSW    [name], MSW, [facility] 01/14/2023 5:25 PM

## 2023-01-14 NOTE — ED Provider Notes (Signed)
Emergency Medicine Observation Re-evaluation Note  [name] is a 62 y.o. female, seen on rounds today.  Pt initially presented to the ED for complaints of IVC Currently, the patient is pacing around the room moving all extremities well.  Physical Exam  BP 122/76 (BP Location: Right Arm)   Pulse 66   Temp 98 F (36.7 C) (Oral)   Resp 16   Ht 5\' 4"  (1.626 m)   Wt 40.4 kg   SpO2 100%   BMI 15.29 kg/m  Physical Exam General: Pacing around the room not wanting to eat her breakfast.  Nonviolent at this time Cardiac: Was not tachycardic on last vital signs Lungs: Patient having symmetric rise and fall of chest as she is walking around the room and speaking clearly without visible shortness of breath Psych: No acute agitation  ED Course / MDM  EKG:EKG Interpretation  Date/Time:  Thursday January 14 2023 02:49:33 EDT Ventricular Rate:  73 PR Interval:  108 QRS Duration: 66 QT Interval:  482 QTC Calculation: 531 R Axis:   -61 Text Interpretation: Sinus rhythm with short PR Right atrial enlargement Left axis deviation Inferior-posterior infarct , age undetermined ST & T wave abnormality, consider lateral ischemia Prolonged QT Abnormal ECG When compared with ECG of 09-Jan-2023 11:21, PREVIOUS ECG IS PRESENT Confirmed by Ross Marcus ([id number]) on 01/14/2023 2:54:22 AM  I have reviewed the labs performed to date as well as medications administered while in observation.  Recent changes in the last 24 hours include none reported by nursing.  Plan  Current plan is for awaiting inpatient management.    Fabien Travelstead, Canary Brim, MD 01/14/23 1012

## 2023-01-14 NOTE — Progress Notes (Signed)
[facility] Psych ED Progress Note  01/14/2023 8:31 PM [name]  MRN:  [id number]   Principal Problem: Paranoid schizophrenia [facility]) Diagnosis:  Principal Problem:   Paranoid schizophrenia [facility])   ED Assessment Time Calculation: Start Time: 1800 Stop Time: 1815 Total Time in Minutes (Assessment Completion): 15   Subjective:  On evaluation today, the patient is sitting in a chair in her room, in no acute distress. She is calm during this assessment, irritable at times.  Her appearance is appropriate for environment. Her eye contact is minimal. Speech is clear and coherent, normal pace and normal volume. She reports her mood as "ok", I am just here, waiting to go home, I have a home".  Affect is congruent with mood.  Unable to assess patient's thought process and thought content, as she is irritable at the time and just wanted to go home.  Patient currently denies SI/HI/AVH. No indication that she is responding to internal stimuli during this assessment.  No delusions elicited during this assessment.  Patient then yells "why are you keeping me here, I have a home."Per RN note patient remains paranoid and refusing all medications food and fluids, patient does appear to be very thin, almost emaciated looking.    Past Psychiatric History: Paranoid schizophrenia [facility]),  Multiple inpatient Psychiatry hospitalizations.  Last hospitalization was at [facility] in January this year.      [place] Scale:  Flowsheet Row ED from 01/08/2023 in [facility] Emergency Department at [employer] [facility] Admission (Discharged) from 04/08/2022 in [facility] INPATIENT BEHAVIORAL MEDICINE Admission (Discharged) from 10/17/2021 in [facility] BEHAVIORAL MEDICINE  C-SSRS RISK CATEGORY No Risk No Risk No Risk       Past Medical History:  Past Medical History:  Diagnosis Date   Anemia 10/02/2021   Non compliance w medication regimen    Schizophrenia [facility])     Past Surgical History:  Procedure Laterality Date   BIOPSY   09/25/2021   Procedure: BIOPSY;  Surgeon: Rachael Fee, MD;  [facility];  Service: Gastroenterology;;   ESOPHAGOGASTRODUODENOSCOPY (EGD) WITH PROPOFOL [name] 09/25/2021   Procedure: ESOPHAGOGASTRODUODENOSCOPY (EGD) WITH PROPOFOL;  Surgeon: Rachael Fee, MD;  Location: [facility];  Service: Gastroenterology;  Laterality: [name];  With NGT placement   Family History: History reviewed. No pertinent family history.   Social History:  Social History   Substance and Sexual Activity  Alcohol Use Not Currently   Comment: Refuses to disclose how much     Social History   Substance and Sexual Activity  Drug Use No   Comment: Refuses to answer    Social History   Socioeconomic History   Marital status: Single    Spouse name: Not on file   Number of children: Not on file   Years of education: Not on file   Highest education level: Not on file  Occupational History   Not on file  Tobacco Use   Smoking status: Every Day    Packs/day: 2    Types: Cigarettes    Passive exposure: Current   Smokeless tobacco: Never  Vaping Use   Vaping Use: Never used  Substance and Sexual Activity   Alcohol use: Not Currently    Comment: Refuses to disclose how much   Drug use: No    Comment: Refuses to answer   Sexual activity: Not Currently    Comment: refused to answer  Other Topics Concern   Not on file  Social History Narrative   Not on file  Social Determinants of Health   Financial Resource Strain: Not on file  Food Insecurity: No Food Insecurity (04/08/2022)   Hunger Vital Sign    Worried About Running Out of Food in the Last Year: Never true    Ran Out of Food in the Last Year: Never true  Transportation Needs: No Transportation Needs (04/08/2022)   PRAPARE - Administrator, Civil Service (Medical): No    Lack of Transportation (Non-Medical): No  Physical Activity: Not on file  Stress: Not on file  Social Connections: Not on file    Sleep:  Poor  Appetite:  Poor  Current Medications: Current Facility-Administered Medications  Medication Dose Route Frequency Provider Last Rate Last Admin   LORazepam (ATIVAN) tablet 1 mg  1 mg Oral Q8H PRN [name], Nyree Yonker A, PMHNP       Or   LORazepam (ATIVAN) injection 2 mg  2 mg Intramuscular Q8H PRN [name], Lash Matulich A, PMHNP       potassium chloride (KLOR-CON) packet 20 mEq  20 mEq Oral BID Randel Pigg, Dorma Russell, MD   20 mEq at 01/09/23 2156   Current Outpatient Medications  Medication Sig Dispense Refill   haloperidol (HALDOL) 5 MG tablet Take 1 tablet (5 mg total) by mouth 2 (two) times daily. (Patient not taking: Reported on 01/13/2023) 20 tablet 0   lithium carbonate 300 MG capsule Take 2 capsules (600 mg total) by mouth at bedtime. (Patient not taking: Reported on 01/13/2023) 20 capsule 0   mirtazapine (REMERON) 30 MG tablet Take 1 tablet (30 mg total) by mouth at bedtime. (Patient not taking: Reported on 01/13/2023) 10 tablet 0   Multiple Vitamin (MULTIVITAMIN WITH MINERALS) TABS tablet Take 1 tablet by mouth daily. (Patient not taking: Reported on 01/13/2023) 10 tablet 0   nicotine (NICODERM CQ - DOSED IN MG/24 HR) 7 mg/24hr patch Place 1 patch (7 mg total) onto the skin daily. (Remove old patch before applying new patch). (Patient not taking: Reported on 01/13/2023) 14 patch 0   OLANZapine (ZYPREXA) 15 MG tablet Take 2 tablets (30 mg total) by mouth at bedtime. (Patient not taking: Reported on 01/13/2023) 20 tablet 0   QUEtiapine (SEROQUEL) 400 MG tablet Take 1 tablet (400 mg total) by mouth at bedtime. (Patient not taking: Reported on 01/13/2023) 10 tablet 0   thiamine (VITAMIN B1) 100 MG tablet Take 1 tablet (100 mg total) by mouth daily. (Patient not taking: Reported on 01/13/2023) 10 tablet 0    Lab Results:  Results for orders placed or performed during the hospital encounter of 01/08/23 (from the past 48 hour(s))  Comprehensive metabolic panel     Status: Abnormal    Collection Time: 01/13/23  9:51 AM  Result Value Ref Range   Sodium 137 135 - 145 mmol/L    Comment: ELECTROLYTES REPEATED TO VERIFY   Potassium 3.5 3.5 - 5.1 mmol/L   Chloride 96 (L) 98 - 111 mmol/L    Comment: ELECTROLYTES REPEATED TO VERIFY   CO2 19 (L) 22 - 32 mmol/L    Comment: ELECTROLYTES REPEATED TO VERIFY   Glucose, Bld 90 70 - 99 mg/dL    Comment: Glucose reference range applies only to samples taken after fasting for at least 8 hours.   BUN 13 8 - 23 mg/dL   Creatinine, Ser 6.96 0.44 - 1.00 mg/dL   Calcium 9.4 8.9 - 29.5 mg/dL   Total Protein 8.3 (H) 6.5 - 8.1 g/dL   Albumin 4.7 3.5 - 5.0 g/dL  AST 21 15 - 41 U/L   ALT 13 0 - 44 U/L   Alkaline Phosphatase 50 38 - 126 U/L   Total Bilirubin 1.3 (H) 0.3 - 1.2 mg/dL   GFR, Estimated >14 >78 mL/min    Comment: (NOTE) Calculated using the CKD-EPI Creatinine Equation (2021)    Anion gap 22 (H) 5 - 15    Comment: ELECTROLYTES REPEATED TO VERIFY Performed at [facility], [address], [place], [place] [address]     Blood Alcohol level:  Lab Results  Component Value Date   [facility] <10 01/08/2023   ETH <10 03/29/2022    Physical Findings:  CIWA:    COWS:     Musculoskeletal:  Observed patient is sitting in a chair by her door.  Psychiatric Specialty Exam:  Presentation  General Appearance:  Appropriate for Environment  Eye Contact: Poor  Speech: Clear and Coherent  Speech Volume: Normal  Handedness: Right   Mood and Affect  Mood: Irritable  Affect: Appropriate; Constricted   Thought Process  Thought Processes: Disorganized  Descriptions of Associations:Loose  Orientation:Partial  Thought Content:Scattered  History of Schizophrenia/Schizoaffective disorder:Yes  Duration of Psychotic Symptoms:Greater than six months  Hallucinations:Hallucinations: Auditory; Visual  Ideas of Reference:Delusions  Suicidal Thoughts:Suicidal Thoughts: No  Homicidal  Thoughts:Homicidal Thoughts: No   Sensorium  Memory: Immediate Fair; Recent Fair  Judgment: Impaired  Insight: Lacking   Executive Functions  Concentration: Poor  Attention Span: Poor  Recall: Poor  Fund of Knowledge: Poor  Language: Fair   Psychomotor Activity  Psychomotor Activity: Psychomotor Activity: Normal   Assets  Assets: Communication Skills; Social Support   Sleep  Sleep: Sleep: Fair    Physical Exam: Physical Exam Vitals and nursing note reviewed. Exam conducted with a chaperone present.  Neurological:     Mental Status: She is alert.  Psychiatric:        Attention and Perception: Attention normal.        Mood and Affect: Affect is flat and angry.        Speech: Speech normal.        Behavior: Behavior is agitated.        Thought Content: Thought content is paranoid and delusional.        Judgment: Judgment is inappropriate.    Review of Systems  Constitutional: Negative.   Psychiatric/Behavioral:  Positive for hallucinations.    Blood pressure 132/87, pulse 64, temperature 97.9 F (36.6 C), temperature source Axillary, resp. rate 14, height 5\' 4"  (1.626 m), weight 40.4 kg, SpO2 100 %. Body mass index is 15.29 kg/m.   Medical Decision Making: Patient case reviewed and discussed with Dr Lucianne Muss. Patient remains paranoid refusing all food, fluid and medication. She has had no documented intake or output for 3 days. She requires inpatient psychiatric hospitalization.  Patient has been faxed out to several inpatient psychiatric facilities.  Patient recently denied by [facility] ([facility]) , due to acuity level, they feel patient is not appropriate for their milieu.  QTc 531. Patient with multiple risk factors for QTc prolongation to include cardiac abnormalities, female, age >59, 3 Psychotropics that may prolong qtc, and electrolyte abnormalities. Will keep patient on Ativan PO and IM for agitation. Will repeat EKG on 01/15/23 if QTc has  improved may start zyprexa 2.5mg  po qhs.    [name], PMHNP 01/14/2023, 8:31 PM

## 2023-01-14 NOTE — ED Notes (Signed)
Call from Candida RN [facility] requesting note stating pt is medically cleared to go to psych. Repeat EKG was completed and given to Dr. Cristy Folks, as requested. Dr. Wilkie Aye then stated pt is medically cleared to be transferred to psychiatric care. Note obtained and faxed and call to [facility] , Candida RN, who stated that Mrs Daniel was denied admission to their facility.

## 2023-01-14 NOTE — Progress Notes (Signed)
LCSW Progress Note  [id number]   [name]  01/14/2023  12:05 AM    Inpatient Behavioral Health Placement  Pt meets inpatient criteria per Phebe Colla, NP. Referral was sent to the following facilities;    Destination  Service Provider Address Phone Fax  [facility]  [address] [place] [address] [phone]  [facility]  [address] [place] [address] [phone]  [facility]  [address] [place] [address] [phone]  [facility] [address]  [address], [place] [address] [phone]  [facility]  [address] [place], [place] [address] [phone]  [facility]  [address] [place], [place] [address] [phone]  [facility]  [address], [place] [address] [phone]  [facility]  [address] [place], [place] [address] [phone]  [facility]  [address], [place] [address] [phone]  [facility]  [address] [place] [address] [phone]  [facility]  [address] [place] [address] [phone]  [facility]  [address] [place] [address] [phone]  [facility]  [address] [place] [address] [phone]  [facility]  [address] [place], [place] [address] [phone]  [facility]  [address] [place] [address] [phone]  [facility]  [address], [place] [address] [phone]  [facility]  [address], [place] [address] [phone]  [facility]  [address] [place], [place] [address] [phone]  [facility] [address] [place]  [address], [place] [address] [phone]  [facility] [place] [address] [phone]  [facility]  [address] [place] [address] [phone]  [facility]  [address] [place] [address] [phone]  [facility]  [address], [place] [address] [phone]  [facility]  [address], [place] [address] [phone]  [facility]  [address], [place] [address] [phone]  [facility]  [address] [place] [address] [phone]  [facility]  [address], [place] [address] [phone]  [facility]  [address] [place] [address] [phone]  [facility]  [address] [place] [address] [phone]  [facility]  [address], [place] [address] [phone]  [facility] (after hours)  [address] [place] [address] [phone]  [facility]  [address] [place] [address] [phone]  [facility]  [address], [place] [address] [phone]  [facility]  [address], [place] [address] [phone]  [facility]  [address] [place] [address] [phone]   Situation ongoing,  CSW will follow  up.    [name], MSW, LCSWA 01/14/2023 12:05 AM

## 2023-01-14 NOTE — Progress Notes (Signed)
This CSW received an update from [place] system advising that they received referral from this CSW however there are no open beds. [facility] did request follow up if pt is placed out of network from the [place].   [name], MSW, [facility] 01/14/2023 5:28 PM

## 2023-01-14 NOTE — Progress Notes (Signed)
LCSW Progress Note  [id number]   [name]  01/14/2023  2:04 PM  Description:   Inpatient Psychiatric Referral  Patient was recommended inpatient per [facility], PMHNP. There are no available beds at [facility] or [facility], per [facility], Charity fundraiser. Patient was referred to the following out of network facilities:   Production assistant, radio Address Phone Fax  [facility]  [address] [place] [address] [phone]  [facility]  [address] [place] [place] [address] [phone]  [facility]  [address] [place] [address] [phone]  [facility] [address]  [address] [place] [address] [phone]  [facility]  [address] [place], [place] [address] [phone]  [facility]  [address] [place], [place] [address] [phone]  [facility]  [address], [place] [address] [phone]  [facility]  [address] [place], [place] [address] [phone]  [facility]  [address] [place] [address] [phone]  [facility]  [address], [place] [address] [phone]  [facility]  [address] [place] [address] [phone]  [facility]  [address], [place] [address] [phone]  [facility]  [address] [place] [address] [phone]  [facility]  [address] [place], [place] [address] [phone]  [facility]  [address] [place] [address] [phone]  [facility]  [address], [place] [address] [phone]  [facility]  [address], [place] [address] [phone]  [facility] [place]  [address] [place], [place] [address] [phone]  [facility] [address] [place]  [address], [place] [address] [phone]  [facility] [place] [address] [phone]  [facility]  [address] [place] [address] [phone]  [facility]  [address] [place] [address] [phone]  [facility]  [address] [place] [address] [phone]  [facility]  [address] [place] [address] [phone]  [facility]  [address], [place] [address] [phone]  [facility]  [address] [place] [address] [phone]  [facility]  [address] [place] [address] [phone]  [facility]  [address] [place] [address] [phone]  [facility]  [address] [place] [address] [phone]  [facility]  [address], [place] [address] [phone]  [facility]  [address] [place] [address] [phone]     Situation ongoing, CSW to continue following and update chart as more information becomes available.      Cathie Beams, [place]  01/14/2023 2:04 PM

## 2023-01-14 NOTE — Progress Notes (Signed)
This CSW spoke with Para March, intake coordinator, at [facility]) who shared that pt has now been DENIED. [facility] report pt is too acute for their facility per review with their medical director who has [facility] with [facility] at [facility] he reports pt is not appropriate from their milieu. CSW/Disposition team will continue to speak inpatient behavioral health placement.   Care Team notified: Alona Bene, PMHNP, [name] Babe, RN, and [place], LCSW  [name], MSW, [facility] 01/14/2023 7:00 PM

## 2023-01-14 NOTE — ED Notes (Signed)
Pt refused for temp to be taken 

## 2023-01-14 NOTE — ED Provider Notes (Signed)
Requested by nursing to evaluate the patient's chart regarding medical clearance.  I have reviewed the patient's chart.  She is being evaluated for psychiatric transfer.  She has been hypokalemic and has had prolonged QT.  She has been rounded on by the hospitalist but has been refusing repeat lab work.  However, most recent lab work has been more reassuring with potassium of 3.5 as of 0951 on 6/26.  Previous QTc was significantly prolonged.  I requested a repeat EKG.  Repeat EKG shows improvement of QTc to 531.  Given this information, feel she is appropriately medically cleared for psychiatric evaluation and transfer.   EKG Interpretation  Date/Time:  Thursday January 14 2023 02:49:33 EDT Ventricular Rate:  73 PR Interval:  108 QRS Duration: 66 QT Interval:  482 QTC Calculation: 531 R Axis:   -61 Text Interpretation: Sinus rhythm with short PR Right atrial enlargement Left axis deviation Inferior-posterior infarct , age undetermined ST & T wave abnormality, consider lateral ischemia Prolonged QT Abnormal ECG When compared with ECG of 09-Jan-2023 11:21, PREVIOUS ECG IS PRESENT Confirmed by Ross Marcus ([id number]) on 01/14/2023 2:54:22 AM          Wilkie Aye, Mayer Masker, MD 01/14/23 [phone]

## 2023-01-14 NOTE — ED Notes (Signed)
Patient alert. Patient irritable and agitated at times when interact with patient. Patient states, "just leave me alone".  Patient having visual and auditory hallucinations.

## 2023-01-14 NOTE — ED Notes (Signed)
Patient has refused to eat or drink this shift.  Patient continues to hallucinate.

## 2023-01-14 NOTE — Progress Notes (Addendum)
ADDENDUM  1:50 PM - CSW received return phone call from Naranja, Clinical cytogeneticist, at [facility]. Victorino Dike reports they have received pt's full referral packet that was sent yesterday. However, they are still on mental health capacity at this time. CSW will continue to assist and follow with placement.  1:24 PM - CSW attempted to speak with Vernona Rieger, Clinical cytogeneticist, at [facility] regarding bed availability and pt's referral status. CSW was unable to speak with Vernona Rieger, but requested a return phone call to discuss pt status.  Cathie Beams, [place]  01/14/2023 1:44 PM

## 2023-01-14 NOTE — ED Notes (Signed)
Appears to be responding to internal Stimuli as evidenced by continous  rambling conversation with self. Refusing medication and food.

## 2023-01-15 NOTE — Progress Notes (Addendum)
ADDENDUM  3:31 PM - CSW received follow up phone call from Martinez, Clinical cytogeneticist, at [facility]. Victorino Dike reports she will scan in referral for AOD provider to review this evening. 2nd shift CSW to follow up.  2:47 PM - CSW spoke with Victorino Dike, Clinical cytogeneticist, at [facility] via phone call. Victorino Dike confirmed they have received part of pt's referral packet. Victorino Dike reports she is waiting for full referral packet to come through before reviewing. CSW will await follow up.  Cathie Beams, [place]  01/15/2023 2:08 PM

## 2023-01-15 NOTE — Progress Notes (Signed)
This CSW called [place] at 8:40a and spoke with Care Coordinator Carlisle Cater, who shared that there would be bed availability this weekend. [facility] at 9[facility]25am and spoke back to Victorino Dike [phone] who shared that CSW could send secure email [email or web address] with the following [facility] forms: [facility] Mental Health Transfer Information Sheet, and Transport form [id number].  CSW received conformation via email that forms have been received. This sent clinical documents and IVC via EPIC to [facility].  [facility]  [address], [place] [address] [phone]     Care Team notified: Alona Bene, [facility], Janey Genta, MD, Cathie Beams, LCSW, [name] Babe, RN   [name], MSW, [facility] 01/15/2023 12:54 PM

## 2023-01-15 NOTE — ED Provider Notes (Signed)
Emergency Medicine Observation Re-evaluation Note  Romanita Debaker is a 62 y.o. female, seen on rounds today.  Pt initially presented to the ED for complaints of IVC Currently, the patient is asleep.  Nursing staff informed me that patient did not have any issues overnight.  Physical Exam  BP 107/61 (BP Location: Left Arm)   Pulse (!) 56   Temp 97.9 F (36.6 C) (Axillary)   Resp 16   Ht 5\' 4"  (1.626 m)   Wt 40.4 kg   SpO2 100%   BMI 15.29 kg/m  Physical Exam General: no distress Psych: Currently asleep, largely cooperative last night  ED Course / MDM  EKG:EKG Interpretation Date/Time:  Thursday January 14 2023 02:49:33 EDT Ventricular Rate:  73 PR Interval:  108 QRS Duration:  66 QT Interval:  482 QTC Calculation: 531 R Axis:   -61  Text Interpretation: Sinus rhythm with short PR Right atrial enlargement Left axis deviation Inferior-posterior infarct , age undetermined ST & T wave abnormality, consider lateral ischemia Prolonged QT Abnormal ECG When compared with ECG of 09-Jan-2023 11:21, PREVIOUS ECG IS PRESENT Confirmed by Ross Marcus ([id number]) on 01/14/2023 2:54:22 AM  I have reviewed the labs performed to date as well as medications administered while in observation.  Recent changes in the last 24 hours include -no changes noted.  No new psychiatry note.  Plan  Current plan is for patient to be held in the emergency room.  Patient is awaiting psychiatry placement.    Derwood Kaplan, MD 01/15/23 [phone]

## 2023-01-15 NOTE — ED Notes (Signed)
Pt has refused all meals provided throughout the day.

## 2023-01-15 NOTE — ED Notes (Signed)
Lunch tray brought to pt, states she doesn't want anything at this time, wants to continue sleeping.

## 2023-01-16 DIAGNOSIS — F200 Paranoid schizophrenia: Secondary | ICD-10-CM | POA: Diagnosis not present

## 2023-01-16 NOTE — ED Notes (Signed)
Patient refused to eat dinner, tray left in the room after numerous attempts to get patient to eat.

## 2023-01-16 NOTE — ED Notes (Signed)
Patient refuses medications. Refuses food. Patient becomes agitated when nurse enters room and initiates conversation. Patient repeatedly states she "wants home". Writer notified treating psych NP.

## 2023-01-16 NOTE — ED Notes (Signed)
[name] remains asleep with easy respirations no sign of distress noted.

## 2023-01-16 NOTE — BH Assessment (Signed)
Disposition Updates:  At 12:50 PM, the clinician contacted [facility] AOD Jaci Standard) [phone]) to inquire about updates on a patient referral faxed by the LCSW during the previous shift. Jaci Standard informed this Clinician that she was currently occupied with entering another patient's information and couldn't switch screens. She requested the patient's name, date of birth, and the last four digits of her social security number. Jaci Standard also asked for this clinician's contact information and mentioned that she would call back once she had reviewed the patient's record to assess the status of the referral.  It's noted that [facility] is currently not accepting referrals, as communicated by Harrison Mons, RN, the charge nurse of the [name] unit.   In the meantime, since [facility] placement is not an option and patient is being considered at the [facility], this clinician has also pursued placement options at other alternative hospitals (listed below).   Destination  Service Provider Request Status Selected Services Address Phone Fax Patient Preferred  [facility]  Pending - Request Sent [name] [address] [place] [address] [phone] --  [facility]  Pending - Request Sent [name] [address] [place] [address] [phone] --  [facility]  Pending - Request Sent [name] [address] [place] [address] [phone] --  [facility]  Pending - Request Sent [name] [address], [place] [address] [phone] --  [name]  Pending - Request Sent [name] [address] [place] [address] [phone] --  [facility]  Pending - Request Sent [name] [address] [place] [address] [phone] --  [facility]  Pending - Request Sent [name] [address],  [place] [address] [phone] --  [facility]  Pending - Request Sent [name] [address] [place], [place] [address] [phone] --  [facility]  Pending - Request Sent [name] [address], [place] [address] [phone] --  [facility]  Pending - Request Sent [name] [address] [place] [address] [phone] --  [facility]  Pending - Request Sent [name] [address] [place] [address] [phone] --  [facility]  Pending - Request Sent [name] [address], [place] [address] [phone] --  [facility]  Pending - Request Sent [name] [address] [place], [place] [address] [phone] --  [facility]  Pending - Request Sent [name] [address] [place], [place] [address] [phone] --  [facility]  Pending - Request Sent [name] [address] [place] [address] [phone] --  [facility]  Pending - Request Sent [name] [address], [place] [address] [phone] --  [facility]  Pending - Request Sent [name] [address], [place] [address] [phone] --  [facility]  Pending - Request Sent [name] [address] , [place] [address] [phone] --  [facility]  Pending - Request Sent [name] [address], [place] [address] [phone] --  [facility]  Pending - Request Sent [name] [address] [place] [address] [phone] --  [facility]  Pending - Request Sent [name] [phone] [place] [address] [phone] --  [facility]  Pending  -  Request Sent [name] [address], [place] [address] [phone] --  [facility]  Pending - Request Sent [name] [address] [place] [address] [phone] --  [facility]  Pending - Request Sent [name] [address] [place] [address] [phone] --  [facility]  Pending - Request Sent [name] [address] [place], [place] [address] [phone] --  [facility]  Pending - Request Sent [name] [address], [place] [address] [phone] --  [facility]  Pending - Request Sent [name] [address] [place] [address] [phone] --  [facility]  Pending - Request Sent [name] [address] [place] [place] [address] [phone] --  [facility]  Pending - Request Sent [name] [address] [place] [address] [phone] --  [facility]  Pending - Request Sent [name] [address] [place] [address] [phone] --  [facility]  Pending - Request Sent [name] [address] [place] [address] [phone] --  [facility]  Pending - Request Sent [name] [address], [place] [address] [phone] --  [facility]  Pending - Request Sent [name] [address], [place] [address] [phone] --  [facility]  Pending - Request Sent [name] [address], [place] [address] [phone] --  [facility]  Pending - Request Sent [name] [address], [place] [address] [phone]

## 2023-01-16 NOTE — Progress Notes (Signed)
01/15/23- At 4:24pm this CSW spoke back with Care Coordinator Carlisle Cater who shared that IVC, COVID, and clinical documentation was still needed. This CSW manually sent fax to [facility] after hours fax number [phone].  This CSW has made multiple attempts to contact the [place] AOD [phone] but has not been successful with follow up. 1st shift to assist with follow up with [facility].    [name], MSW, LCSWA 01/16/2023 1:23 AM

## 2023-01-16 NOTE — Progress Notes (Signed)
[facility] Psych ED Progress Note  01/16/2023 2:12 PM Australia Penniman  MRN:  [id number]   Principal Problem: Paranoid schizophrenia [facility]) Diagnosis:  Principal Problem:   Paranoid schizophrenia [facility])   ED Assessment Time Calculation: Start Time: 1200 Stop Time: 1215 Total Time in Minutes (Assessment Completion): 15   Subjective: On evaluation today, the patient is laying in bed with blankets up to her chin, she appears to be in no acute distress. Her appearance is appropriate for environment. Her eye contact is poor. Speech is clear and coherent, normal pace and normal volume. Patient is irritable upon approach, she tells provider to discharge me or get out of my room in a loud voice.  Provider informed patient that she spoke with her sister who is her legal guardian, patient and yell "I do not have no damn sister."Patient again tells provider to get out of her room.  Provider spoke with patient's sister Eli Rydalch who is also patient's legal guardian, she informs provider that patient gets to a point where she will not eat, says that she had to admit patient and September 2023 due to patient not eating or taking any medications and said that patient stayed in the hospital until December 2023, receiving an IV fluids, and stated patient did not start eating by mouth around the end of November 2023.  She states this is why she had to become patient's legal guardian in 2018 because patient will have periods where she will not eat or drink or anything.  She states this is an ongoing problem with patient.  Per RN notes patient has been refusing medications and food, is constantly agitated when staff enters her room, she repeatedly tells staff to get out and that she wants to go home.  Per notes patient has not eating anything by mouth in about 2 days, EDPs have been advised.    Past Psychiatric History: Paranoid schizophrenia [facility]),  Multiple inpatient Psychiatry hospitalizations.  Last  hospitalization was at [facility] in January this year.   [place] Scale:  Flowsheet Row ED from [facility]08[facility]20[facility] Department at [facility] Admission (Discharged) from 04/08/2022 in [facility] INPATIENT BEHAVIORAL MEDICINE Admission (Discharged) from 10/17/2021 in [facility]AVIORAL [facility]ICINE  C-SSRS RISK CATEGORY No Risk No Risk No Risk       Past Medical History:  Past Medical History:  Diagnosis Date   Anemia 10/02/2021   Non compliance w medication regimen    Schizophrenia [facility])     Past Surgical History:  Procedure Laterality Date   BIOPSY  09/25/2021   Procedure: BIOPSY;  Surgeon: Rachael Fee, MD;  [facility];  Service: Gastroenterology;;   ESOPHAGOGASTRODUODENOSCOPY (EGD) WITH PROPOFOL [name] 09/25/2021   Procedure: ESOPHAGOGASTRODUODENOSCOPY (EGD) WITH PROPOFOL;  Surgeon: Rachael Fee, MD;  Location: [facility];  Service: Gastroenterology;  Laterality: [name];  With NGT placement   Family History: History reviewed. No pertinent family history.  Social History:  Social History   Substance and Sexual Activity  Alcohol Use Not Currently   Comment: Refuses to disclose how much     Social History   Substance and Sexual Activity  Drug Use No   Comment: Refuses to answer    Social History   Socioeconomic History   Marital status: Single    Spouse name: Not on file   Number of children: Not on file   Years of education: Not on file   Highest education level: Not on file  Occupational History   Not  on file  Tobacco Use   Smoking status: Every Day    Packs/day: 2    Types: Cigarettes    Passive exposure: Current   Smokeless tobacco: Never  Vaping Use   Vaping Use: Never used  Substance and Sexual Activity   Alcohol use: Not Currently    Comment: Refuses to disclose how much   Drug use: No    Comment: Refuses to answer   Sexual activity: Not Currently    Comment: refused to answer  Other Topics Concern   Not on file  Social  History Narrative   Not on file   Social Determinants of Health   Financial Resource Strain: Not on file  Food Insecurity: No Food Insecurity (04/08/2022)   Hunger Vital Sign    Worried About Running Out of Food in the Last Year: Never true    Ran Out of Food in the Last Year: Never true  Transportation Needs: No Transportation Needs (04/08/2022)   PRAPARE - Administrator, Civil Service (Medical): No    Lack of Transportation (Non-Medical): No  Physical Activity: Not on file  Stress: Not on file  Social Connections: Not on file    Sleep: Fair  Appetite:  Poor  Current Medications: Current Facility-Administered Medications  Medication Dose Route Frequency Provider Last Rate Last Admin   LORazepam (ATIVAN) tablet 1 mg  1 mg Oral Q8H PRN Motley-Mangrum, Mandie Crabbe A, PMHNP       Or   LORazepam (ATIVAN) injection 2 mg  2 mg Intramuscular Q8H PRN Motley-Mangrum, Dianna Deshler A, PMHNP       potassium chloride (KLOR-CON) packet 20 mEq  20 mEq Oral BID Randel Pigg, Dorma Russell, MD   20 mEq at 01/09/23 2156   Current Outpatient Medications  Medication Sig Dispense Refill   haloperidol (HALDOL) 5 MG tablet Take 1 tablet (5 mg total) by mouth 2 (two) times daily. (Patient not taking: Reported on 01/13/2023) 20 tablet 0   lithium carbonate 300 MG capsule Take 2 capsules (600 mg total) by mouth at bedtime. (Patient not taking: Reported on 01/13/2023) 20 capsule 0   mirtazapine (REMERON) 30 MG tablet Take 1 tablet (30 mg total) by mouth at bedtime. (Patient not taking: Reported on 01/13/2023) 10 tablet 0   Multiple Vitamin (MULTIVITAMIN WITH MINERALS) TABS tablet Take 1 tablet by mouth daily. (Patient not taking: Reported on 01/13/2023) 10 tablet 0   nicotine (NICODERM CQ - DOSED IN MG/24 HR) 7 mg/24hr patch Place 1 patch (7 mg total) onto the skin daily. (Remove old patch before applying new patch). (Patient not taking: Reported on 01/13/2023) 14 patch 0   OLANZapine (ZYPREXA) 15 MG tablet Take 2  tablets (30 mg total) by mouth at bedtime. (Patient not taking: Reported on 01/13/2023) 20 tablet 0   QUEtiapine (SEROQUEL) 400 MG tablet Take 1 tablet (400 mg total) by mouth at bedtime. (Patient not taking: Reported on 01/13/2023) 10 tablet 0   thiamine (VITAMIN B1) 100 MG tablet Take 1 tablet (100 mg total) by mouth daily. (Patient not taking: Reported on 01/13/2023) 10 tablet 0    Lab Results: No results found for this or any previous visit (from the past 48 hour(s)).  Blood Alcohol level:  Lab Results  Component Value Date   [facility] <10 01/08/2023   ETH <10 03/29/2022    Physical Findings:  CIWA:    COWS:     Musculoskeletal:  Observed patient resting in bed.  Psychiatric Specialty Exam:  Presentation  General  Appearance:  Disheveled  Eye Contact: Poor  Speech: Clear and Coherent  Speech Volume: Increased  Handedness: Right   Mood and Affect  Mood: Irritable  Affect: Constricted; Flat   Thought Process  Thought Processes: Other (comment) (UTA)  Descriptions of Associations:-- (UTA)  Orientation:Other (comment) (UTA)  Thought Content:Other (comment) (UTA)  History of Schizophrenia/Schizoaffective disorder:Yes  Duration of Psychotic Symptoms:Greater than six months  Hallucinations:Hallucinations: Other (comment) (UTA)  Ideas of Reference:Delusions  Suicidal Thoughts:Suicidal Thoughts: -- (UTA)  Homicidal Thoughts:Homicidal Thoughts: -- (UTA)   Sensorium  Memory: Other (comment) (UTA)  Judgment: Impaired  Insight: Lacking   Executive Functions  Concentration: Poor  Attention Span: Poor  Recall: Poor  Fund of Knowledge: Poor  Language: Fair   Lexicographer Activity: Psychomotor Activity: Normal   Assets  Assets: Housing; Social Support   Sleep  Sleep: Sleep: Fair    Physical Exam: Physical Exam Vitals and nursing note reviewed. Exam conducted with a chaperone present.  Neurological:      Mental Status: She is alert.  Psychiatric:        Attention and Perception: She is inattentive.        Mood and Affect: Affect is angry.        Speech: Speech normal.        Behavior: Behavior is agitated.        Judgment: Judgment is inappropriate.    Review of Systems  Constitutional: Negative.   Psychiatric/Behavioral:  Positive for depression and hallucinations.    Blood pressure 114/67, pulse (!) 53, temperature 97.9 F (36.6 C), temperature source Axillary, resp. rate 20, height 5\' 4"  (1.626 m), weight 40.4 kg, SpO2 100 %. Body mass index is 15.29 kg/m.    Medical Decision Making: Patient continues to require inpatient Psychiatric hospitalization.  Patient is being reviewed at the [facility].  EDPs are aware of patient not eating, drinking or taking any medications.   Venisa Frampton MOTLEY-MANGRUM, PMHNP 01/16/2023, 2:12 PM

## 2023-01-16 NOTE — ED Notes (Signed)
Patient refusing COVID test. Refusing to have temperature taken. Becomes upset and yells when approached.

## 2023-01-16 NOTE — ED Notes (Signed)
Pt was asked did she want a night time snack and pt said leave her alone and she didn't want nothing but to get out of here.

## 2023-01-16 NOTE — ED Notes (Signed)
Pt responding to internal stimuli

## 2023-01-16 NOTE — ED Notes (Addendum)
Remains self isolating and refuses to interact with staff by being verbally hostile. No physical aggressive bx noted

## 2023-01-16 NOTE — ED Notes (Signed)
Pt is sleeping with easy respirations no distress or sleep disturbance is noted.

## 2023-01-16 NOTE — ED Notes (Signed)
Pt allowed me to obtain vitals. She refused temperature check even when offered for her to obtain herself. Pt is still refusing to eat. Asked her to let me know whenever she feels hungry.

## 2023-01-17 LAB — CBC
HCT: 41.8 % (ref 36.0–46.0)
Hemoglobin: 13.4 g/dL (ref 12.0–15.0)
MCH: 27.7 pg (ref 26.0–34.0)
MCHC: 32.1 g/dL (ref 30.0–36.0)
MCV: 86.5 fL (ref 80.0–100.0)
Platelets: 212 10*3/uL (ref 150–400)
RBC: 4.83 MIL/uL (ref 3.87–5.11)
RDW: 14.2 % (ref 11.5–15.5)
WBC: 3.6 10*3/uL — ABNORMAL LOW (ref 4.0–10.5)
nRBC: 0 % (ref 0.0–0.2)

## 2023-01-17 LAB — BASIC METABOLIC PANEL
Anion gap: 16 — ABNORMAL HIGH (ref 5–15)
BUN: 18 mg/dL (ref 8–23)
CO2: 27 mmol/L (ref 22–32)
Calcium: 9.1 mg/dL (ref 8.9–10.3)
Chloride: 100 mmol/L (ref 98–111)
Creatinine, Ser: 0.8 mg/dL (ref 0.44–1.00)
GFR, Estimated: >60 mL/min (ref >60)
Glucose, Bld: 74 mg/dL (ref 70–99)
Potassium: 2.9 mmol/L — ABNORMAL LOW (ref 3.5–5.1)
Sodium: 143 mmol/L (ref 135–145)

## 2023-01-17 LAB — MAGNESIUM: Magnesium: 2.5 mg/dL — ABNORMAL HIGH (ref 1.7–2.4)

## 2023-01-17 MED ORDER — POTASSIUM CHLORIDE CRYS ER 20 MEQ PO TBCR: 40.0000 meq | EXTENDED_RELEASE_TABLET | Freq: Once | ORAL | Status: DC

## 2023-01-17 MED ORDER — HALOPERIDOL 5 MG PO TABS: 10.0000 mg | ORAL_TABLET | Freq: Once | ORAL | Status: DC

## 2023-01-17 MED ORDER — HALOPERIDOL LACTATE 5 MG/ML IJ SOLN: 5.0000 mg | Freq: Once | INTRAMUSCULAR | Status: DC

## 2023-01-17 MED ORDER — HALOPERIDOL 5 MG PO TABS
5.0000 mg | ORAL_TABLET | Freq: Once | ORAL | Status: AC
  Filled 2023-01-17: qty 1

## 2023-01-17 MED ORDER — HALOPERIDOL LACTATE 5 MG/ML IJ SOLN
5.0000 mg | Freq: Once | INTRAMUSCULAR | Status: AC
  Administered 2023-01-17: 5 mg via INTRAMUSCULAR
  Filled 2023-01-17: qty 1

## 2023-01-17 NOTE — ED Notes (Signed)
Sleeping with no signs of distress or difficult respirations are easy.

## 2023-01-17 NOTE — Progress Notes (Cosign Needed Addendum)
[facility] Psych ED Progress Note  01/17/2023 7:05 PM [name]  MRN:  [id number]   Principal Problem: Paranoid schizophrenia [facility]) Diagnosis:  Principal Problem:   Paranoid schizophrenia [facility])   ED Assessment Time Calculation: Start Time: 0945 Stop Time: 1000 Total Time in Minutes (Assessment Completion): 15   Subjective:  On evaluation today, the patient is laying in bed with blankets up to her chin, she appears to be in no acute distress. Her appearance is appropriate for environment. Her eye contact is poor. Speech is clear and coherent, normal pace and normal volume. Patient is irritable upon approach, she tells provider to discharge me or get out of my room in a loud voice.    Per RN Notes: Patient continues to refuse care.  Patient is refusing to eat or drink. Patient continues to hallucinate and be agitated..    Past Psychiatric History: Paranoid schizophrenia [facility]),  Multiple inpatient Psychiatry hospitalizations.  Last hospitalization was at [facility] in January this year.     [place] Scale:  Flowsheet Row ED from 01/08/2023 in [facility] Emergency Department at [facility] Admission (Discharged) from 04/08/2022 in [facility] INPATIENT BEHAVIORAL MEDICINE Admission (Discharged) from 10/17/2021 in [facility] BEHAVIORAL MEDICINE  C-SSRS RISK CATEGORY No Risk No Risk No Risk       Past Medical History:  Past Medical History:  Diagnosis Date   Anemia 10/02/2021   Non compliance w medication regimen    Schizophrenia [facility])     Past Surgical History:  Procedure Laterality Date   BIOPSY  09/25/2021   Procedure: BIOPSY;  Surgeon: Rachael Fee, MD;  Location: [facility];  Service: Gastroenterology;;   ESOPHAGOGASTRODUODENOSCOPY (EGD) WITH PROPOFOL [name] 09/25/2021   Procedure: ESOPHAGOGASTRODUODENOSCOPY (EGD) WITH PROPOFOL;  Surgeon: Rachael Fee, MD;  Location: [facility];  Service: Gastroenterology;  Laterality: [name];  With NGT placement   Family History: History  reviewed. No pertinent family history.   Social History:  Social History   Substance and Sexual Activity  Alcohol Use Not Currently   Comment: Refuses to disclose how much     Social History   Substance and Sexual Activity  Drug Use No   Comment: Refuses to answer    Social History   Socioeconomic History   Marital status: Single    Spouse name: Not on file   Number of children: Not on file   Years of education: Not on file   Highest education level: Not on file  Occupational History   Not on file  Tobacco Use   Smoking status: Every Day    Packs/day: 2    Types: Cigarettes    Passive exposure: Current   Smokeless tobacco: Never  Vaping Use   Vaping Use: Never used  Substance and Sexual Activity   Alcohol use: Not Currently    Comment: Refuses to disclose how much   Drug use: No    Comment: Refuses to answer   Sexual activity: Not Currently    Comment: refused to answer  Other Topics Concern   Not on file  Social History Narrative   Not on file   Social Determinants of Health   Financial Resource Strain: Not on file  Food Insecurity: No Food Insecurity (04/08/2022)   Hunger Vital Sign    Worried About Running Out of Food in the Last Year: Never true    Ran Out of Food in the Last Year: Never true  Transportation Needs: No Transportation Needs (04/08/2022)   PRAPARE - Transportation  Lack of Transportation (Medical): No    Lack of Transportation (Non-Medical): No  Physical Activity: Not on file  Stress: Not on file  Social Connections: Not on file    Sleep: Fair  Appetite:  Poor  Current Medications: Current Facility-Administered Medications  Medication Dose Route Frequency Provider Last Rate Last Admin   LORazepam (ATIVAN) tablet 1 mg  1 mg Oral Q8H PRN Motley-Mangrum, Donovin Kraemer A, PMHNP       Or   LORazepam (ATIVAN) injection 2 mg  2 mg Intramuscular Q8H PRN Motley-Mangrum, Demeshia Sherburne A, PMHNP       potassium chloride (KLOR-CON) packet 20 mEq  20 mEq  Oral BID Randel Pigg, Dorma Russell, MD   20 mEq at 01/09/23 2156   Current Outpatient Medications  Medication Sig Dispense Refill   haloperidol (HALDOL) 5 MG tablet Take 1 tablet (5 mg total) by mouth 2 (two) times daily. (Patient not taking: Reported on 01/13/2023) 20 tablet 0   lithium carbonate 300 MG capsule Take 2 capsules (600 mg total) by mouth at bedtime. (Patient not taking: Reported on 01/13/2023) 20 capsule 0   mirtazapine (REMERON) 30 MG tablet Take 1 tablet (30 mg total) by mouth at bedtime. (Patient not taking: Reported on 01/13/2023) 10 tablet 0   Multiple Vitamin (MULTIVITAMIN WITH MINERALS) TABS tablet Take 1 tablet by mouth daily. (Patient not taking: Reported on 01/13/2023) 10 tablet 0   nicotine (NICODERM CQ - DOSED IN MG/24 HR) 7 mg/24hr patch Place 1 patch (7 mg total) onto the skin daily. (Remove old patch before applying new patch). (Patient not taking: Reported on 01/13/2023) 14 patch 0   OLANZapine (ZYPREXA) 15 MG tablet Take 2 tablets (30 mg total) by mouth at bedtime. (Patient not taking: Reported on 01/13/2023) 20 tablet 0   QUEtiapine (SEROQUEL) 400 MG tablet Take 1 tablet (400 mg total) by mouth at bedtime. (Patient not taking: Reported on 01/13/2023) 10 tablet 0   thiamine (VITAMIN B1) 100 MG tablet Take 1 tablet (100 mg total) by mouth daily. (Patient not taking: Reported on 01/13/2023) 10 tablet 0    Lab Results:  Results for orders placed or performed during the hospital encounter of 01/08/23 (from the past 48 hour(s))  Basic metabolic panel     Status: Abnormal   Collection Time: 01/17/23  6:04 PM  Result Value Ref Range   Sodium 143 135 - 145 mmol/L   Potassium 2.9 (L) 3.5 - 5.1 mmol/L   Chloride 100 98 - 111 mmol/L   CO2 27 22 - 32 mmol/L   Glucose, Bld 74 70 - 99 mg/dL    Comment: Glucose reference range applies only to samples taken after fasting for at least 8 hours.   BUN 18 8 - 23 mg/dL   Creatinine, Ser 1.61 0.44 - 1.00 mg/dL   Calcium 9.1 8.9 - 09.6 mg/dL    GFR, Estimated >04 >54 mL/min    Comment: (NOTE) Calculated using the CKD-EPI Creatinine Equation (2021)    Anion gap 16 (H) 5 - 15    Comment: Performed at [facility], [address], [place], [place] [address]  Magnesium     Status: Abnormal   Collection Time: 01/17/23  6:04 PM  Result Value Ref Range   Magnesium 2.5 (H) 1.7 - 2.4 mg/dL    Comment: Performed at [facility], [address], [place], [place] [address]  CBC     Status: Abnormal   Collection Time: 01/17/23  6:04 PM  Result  Value Ref Range   WBC 3.6 (L) 4.0 - 10.5 K/uL   RBC 4.83 3.87 - 5.11 MIL/uL   Hemoglobin 13.4 12.0 - 15.0 g/dL   HCT 08.6 57.8 - 46.9 %   MCV 86.5 80.0 - 100.0 fL   MCH 27.7 26.0 - 34.0 pg   MCHC 32.1 30.0 - 36.0 g/dL   RDW 62.9 52.8 - 41.3 %   Platelets 212 150 - 400 K/uL   nRBC 0.0 0.0 - 0.2 %    Comment: Performed at [facility], [address], [place], [place] [address]    Blood Alcohol level:  Lab Results  Component Value Date   [facility] <10 01/08/2023   ETH <10 03/29/2022    Physical Findings:  CIWA:    COWS:     Musculoskeletal:  Observed resting in bed.   Psychiatric Specialty Exam:  Presentation  General Appearance:  Disheveled  Eye Contact: Poor  Speech: Clear and Coherent  Speech Volume: Increased  Handedness: Right   Mood and Affect  Mood: Irritable  Affect: Flat   Thought Process  Thought Processes: Other (comment) (UTA)  Descriptions of Associations:-- (UTA)  Orientation:Other (comment) (UTA)  Thought Content:Other (comment) (UTA)  History of Schizophrenia/Schizoaffective disorder:Yes  Duration of Psychotic Symptoms:[name]  Hallucinations:Hallucinations: Other (comment) (UTA)  Ideas of Reference:Other (comment) (UTA)  Suicidal Thoughts:Suicidal Thoughts: No (UTA)  Homicidal Thoughts:Homicidal Thoughts: -- (UTA)   Sensorium  Memory: Immediate Poor; Remote  Poor  Judgment: Impaired  Insight: Lacking   Executive Functions  Concentration: Poor  Attention Span: Poor  Recall: Poor  Fund of Knowledge: Poor  Language: Fair   Psychomotor Activity  Psychomotor Activity: Psychomotor Activity: Normal   Assets  Assets: Communication Skills; Social Support   Sleep  Sleep: Sleep: Fair    Physical Exam: Physical Exam Vitals and nursing note reviewed. Exam conducted with a chaperone present.  Neurological:     Mental Status: She is alert.  Psychiatric:        Mood and Affect: Affect is angry.        Speech: Speech normal.        Behavior: Behavior is agitated.        Thought Content: Thought content is delusional.        Cognition and Memory: Cognition is impaired.        Judgment: Judgment is inappropriate.    Review of Systems  Psychiatric/Behavioral:  Positive for hallucinations.    Blood pressure 117/67, pulse (!) 52, temperature 97.9 F (36.6 C), temperature source Axillary, resp. rate 16, height 5\' 4"  (1.626 m), weight 40.4 kg, SpO2 100 %. Body mass index is 15.29 kg/m.    Medical Decision Making: Patient continues to require inpatient Psychiatric hospitalization.  Patient is being reviewed at the [facility].  EDPs are aware of patient not eating, drinking or taking any medications. Put order in for Haldol 5 mg PO or Haldol 5 mg IM once, due to psychosis/ patient believing her food is poisonous. Patient does have a long Qtc interval at 531, will get another EKG.     Faylinn Schwenn MOTLEY-MANGRUM, PMHNP 01/17/2023, 7:05 PM

## 2023-01-17 NOTE — ED Provider Notes (Signed)
Nurse messaged me to let me know that pt still was not eating.  Repeat labs show k is low again at 2.9, mg 2.5, bun and cr nl.    I ordered kdur, but pt refusing.  She is also refusing EKG.  She did have a prolonged QT on EKG, so she has not been on any antipsychotics.  However, she is not eating/drinking because she thinks it is poison.  So, I think the benefit outweighs the risks.  The psych NP is going to try some IM Haldol to see if that helps.  I will order another bmp for tomorrow am.   Jacalyn Lefevre, MD 01/17/23 [phone]

## 2023-01-17 NOTE — ED Notes (Signed)
Patient was given and offered lunch.  Patient refused lunch.

## 2023-01-17 NOTE — ED Notes (Signed)
Patient was alert.   Patient in bed. Patient continuing to refuse to eat and drink. Patient refused breakfast. Patient irritated and agitated when try to engage.  Patient states"dont want to be here".

## 2023-01-17 NOTE — Progress Notes (Signed)
This CSW spoke with [facility]) [phone]) who informed that pt has been denied due to high medical acuity and failure thieve.   This CSW started [facility] process:  [facility]  [address] [place] [address] [phone]    Disposition team will follow-up   [name], MSW, [facility] 01/17/2023 5:26 PM

## 2023-01-17 NOTE — ED Notes (Signed)
MD Haviland given EKG, and reports that pt still needs haldol, RN to medicate pt

## 2023-01-17 NOTE — ED Notes (Signed)
Patient continues to refuse care.  Patient is refusing to eat or drink.  Patient continues to hallucinate and be agitated.Marland Kitchen  Marland Kitchen

## 2023-01-17 NOTE — ED Notes (Signed)
RN to room to inform pt of the need for an EKG related to her lab work , pt began to yell and refuses care, pt then starts to scream we are harassing her and to leave her alone. RN has messaged MD to inform her of pt refusal.

## 2023-01-17 NOTE — ED Notes (Signed)
Patient continues to refuse to answer assessment questions.

## 2023-01-17 NOTE — ED Provider Notes (Signed)
Emergency Medicine Observation Re-evaluation Note  [name] is a 62 y.o. female, seen on rounds today.  Pt initially presented to the ED for complaints of IVC Currently, the patient is calm  Physical Exam  BP 101/62 (BP Location: Left Arm)   Pulse (!) 117   Temp 97.9 F (36.6 C) (Axillary)   Resp 16   Ht 5\' 4"  (1.626 m)   Wt 40.4 kg   SpO2 97%   BMI 15.29 kg/m  Physical Exam General: NAD Cardiac: Mild tachycardia Lungs: No respiratory distress   ED Course / MDM   I have reviewed the labs performed to date as well as medications administered while in observation.    Plan  Current plan is for pending inpatient psychiatric placement.    Terald Sleeper, MD 01/17/23 1154

## 2023-01-17 NOTE — ED Notes (Signed)
MD Haviland responded back to this RN and states that the psych team are working on medications to give IM, this RN waiting to receive those new orders

## 2023-01-17 NOTE — ED Notes (Signed)
Sleeping intermittantly no signs of distress noted, becomes verbally aggressive when approach by staff.

## 2023-01-18 DIAGNOSIS — F29 Unspecified psychosis not due to a substance or known physiological condition: Secondary | ICD-10-CM

## 2023-01-18 DIAGNOSIS — E876 Hypokalemia: Secondary | ICD-10-CM | POA: Diagnosis not present

## 2023-01-18 DIAGNOSIS — Z7189 Other specified counseling: Secondary | ICD-10-CM | POA: Diagnosis not present

## 2023-01-18 DIAGNOSIS — R9431 Abnormal electrocardiogram [ECG] [EKG]: Secondary | ICD-10-CM | POA: Diagnosis not present

## 2023-01-18 DIAGNOSIS — F200 Paranoid schizophrenia: Secondary | ICD-10-CM | POA: Diagnosis not present

## 2023-01-18 LAB — BASIC METABOLIC PANEL
Anion gap: 18 — ABNORMAL HIGH (ref 5–15)
BUN: 18 mg/dL (ref 8–23)
CO2: 26 mmol/L (ref 22–32)
Calcium: 9.3 mg/dL (ref 8.9–10.3)
Chloride: 100 mmol/L (ref 98–111)
Creatinine, Ser: 0.86 mg/dL (ref 0.44–1.00)
GFR, Estimated: >60 mL/min (ref >60)
Glucose, Bld: 84 mg/dL (ref 70–99)
Potassium: 2.9 mmol/L — ABNORMAL LOW (ref 3.5–5.1)
Sodium: 144 mmol/L (ref 135–145)

## 2023-01-18 LAB — SARS CORONAVIRUS 2 BY RT PCR: SARS Coronavirus 2 by RT PCR: NEGATIVE

## 2023-01-18 MED ORDER — ENOXAPARIN SODIUM 30 MG/0.3ML IJ SOSY
30.0000 mg | PREFILLED_SYRINGE | INTRAMUSCULAR | Status: DC
  Administered 2023-01-18 – 2023-01-21 (×4): 30 mg via SUBCUTANEOUS
  Filled 2023-01-18 (×5): qty 0.3

## 2023-01-18 MED ORDER — LACTATED RINGERS IV SOLN: INTRAVENOUS | Status: AC

## 2023-01-18 MED ORDER — POTASSIUM CHLORIDE 10 MEQ/100ML IV SOLN
10.0000 meq | INTRAVENOUS | Status: AC
  Administered 2023-01-18 – 2023-01-19 (×3): 10 meq via INTRAVENOUS
  Filled 2023-01-18 (×3): qty 100

## 2023-01-18 NOTE — Progress Notes (Signed)
[facility] Psych ED Progress Note  01/18/2023 6:39 PM [name]  MRN:  [id number]   Principal Problem: Paranoid schizophrenia (HCC) Diagnosis:  Principal Problem:   Paranoid schizophrenia [facility])   ED Assessment Time Calculation: Start Time: 1821 Stop Time: 1839 Total Time in Minutes (Assessment Completion): 18   Subjective:  Patient continues to refuse care, yells at staff and wanting to go home.  Patient is not eating, not bathing and not taking Medications.  She lays down in bed all covered up.  She would not allow staff come near her. At the advise of DR Lucianne Muss Psychiatrist Palliative consult was called and when the staff came into Patient's room she did not make any eye contact with the staff.  Palliative consult note is in and patient was referred back to EDP for Medical care.  Her Legal guardian believes patient need to be treated medically, any means to put in Nutrients and Medications should be pursed.  Patient is to be seen by Hospitalist for possible inpatient Medical hospitalization.  She will be followed by by Psychiatry when in the Medical floor.  Plan will include forced Medication protocol.  Past Psychiatric History: Paranoid schizophrenia [facility]),  Multiple inpatient Psychiatry hospitalizations.  Last hospitalization was at [facility] in January this year.     [place] Scale:  Flowsheet Row ED from 01/08/2023 in [facility] Emergency Department at [facility] Admission (Discharged) from 04/08/2022 in [facility] INPATIENT BEHAVIORAL MEDICINE Admission (Discharged) from 10/17/2021 in [facility] BEHAVIORAL MEDICINE  C-SSRS RISK CATEGORY No Risk No Risk No Risk       Past Medical History:  Past Medical History:  Diagnosis Date   Anemia 10/02/2021   Non compliance w medication regimen    Schizophrenia [facility])     Past Surgical History:  Procedure Laterality Date   BIOPSY  09/25/2021   Procedure: BIOPSY;  Surgeon: Rachael Fee, MD;  Location: [facility];  Service:  Gastroenterology;;   ESOPHAGOGASTRODUODENOSCOPY (EGD) WITH PROPOFOL [name] 09/25/2021   Procedure: ESOPHAGOGASTRODUODENOSCOPY (EGD) WITH PROPOFOL;  Surgeon: Rachael Fee, MD;  Location: [facility];  Service: Gastroenterology;  Laterality: [name];  With NGT placement   Family History: History reviewed. No pertinent family history.   Social History:  Social History   Substance and Sexual Activity  Alcohol Use Not Currently   Comment: Refuses to disclose how much     Social History   Substance and Sexual Activity  Drug Use No   Comment: Refuses to answer    Social History   Socioeconomic History   Marital status: Single    Spouse name: Not on file   Number of children: Not on file   Years of education: Not on file   Highest education level: Not on file  Occupational History   Not on file  Tobacco Use   Smoking status: Every Day    Packs/day: 2    Types: Cigarettes    Passive exposure: Current   Smokeless tobacco: Never  Vaping Use   Vaping Use: Never used  Substance and Sexual Activity   Alcohol use: Not Currently    Comment: Refuses to disclose how much   Drug use: No    Comment: Refuses to answer   Sexual activity: Not Currently    Comment: refused to answer  Other Topics Concern   Not on file  Social History Narrative   Not on file   Social Determinants of Health   Financial Resource Strain: Not on file  Food Insecurity: No  Food Insecurity (04/08/2022)   Hunger Vital Sign    Worried About Running Out of Food in the Last Year: Never true    Ran Out of Food in the Last Year: Never true  Transportation Needs: No Transportation Needs (04/08/2022)   PRAPARE - Administrator, Civil Service (Medical): No    Lack of Transportation (Non-Medical): No  Physical Activity: Not on file  Stress: Not on file  Social Connections: Not on file    Sleep: Fair  Appetite:  Poor  Current Medications: Current Facility-Administered Medications  Medication Dose  Route Frequency Provider Last Rate Last Admin   LORazepam (ATIVAN) tablet 1 mg  1 mg Oral Q8H PRN Motley-Mangrum, Jadeka A, PMHNP       Or   LORazepam (ATIVAN) injection 2 mg  2 mg Intramuscular Q8H PRN Motley-Mangrum, Jadeka A, PMHNP       potassium chloride (KLOR-CON) packet 20 mEq  20 mEq Oral BID Randel Pigg, Dorma Russell, MD   20 mEq at 01/09/23 2156   potassium chloride SA (KLOR-CON M) CR tablet 40 mEq  40 mEq Oral Once Jacalyn Lefevre, MD       Current Outpatient Medications  Medication Sig Dispense Refill   haloperidol (HALDOL) 5 MG tablet Take 1 tablet (5 mg total) by mouth 2 (two) times daily. (Patient not taking: Reported on 01/13/2023) 20 tablet 0   lithium carbonate 300 MG capsule Take 2 capsules (600 mg total) by mouth at bedtime. (Patient not taking: Reported on 01/13/2023) 20 capsule 0   mirtazapine (REMERON) 30 MG tablet Take 1 tablet (30 mg total) by mouth at bedtime. (Patient not taking: Reported on 01/13/2023) 10 tablet 0   Multiple Vitamin (MULTIVITAMIN WITH MINERALS) TABS tablet Take 1 tablet by mouth daily. (Patient not taking: Reported on 01/13/2023) 10 tablet 0   nicotine (NICODERM CQ - DOSED IN MG/24 HR) 7 mg/24hr patch Place 1 patch (7 mg total) onto the skin daily. (Remove old patch before applying new patch). (Patient not taking: Reported on 01/13/2023) 14 patch 0   OLANZapine (ZYPREXA) 15 MG tablet Take 2 tablets (30 mg total) by mouth at bedtime. (Patient not taking: Reported on 01/13/2023) 20 tablet 0   QUEtiapine (SEROQUEL) 400 MG tablet Take 1 tablet (400 mg total) by mouth at bedtime. (Patient not taking: Reported on 01/13/2023) 10 tablet 0   thiamine (VITAMIN B1) 100 MG tablet Take 1 tablet (100 mg total) by mouth daily. (Patient not taking: Reported on 01/13/2023) 10 tablet 0    Lab Results:  Results for orders placed or performed during the hospital encounter of 01/08/23 (from the past 48 hour(s))  Basic metabolic panel     Status: Abnormal   Collection Time: 01/17/23   6:04 PM  Result Value Ref Range   Sodium 143 135 - 145 mmol/L   Potassium 2.9 (L) 3.5 - 5.1 mmol/L   Chloride 100 98 - 111 mmol/L   CO2 27 22 - 32 mmol/L   Glucose, Bld 74 70 - 99 mg/dL    Comment: Glucose reference range applies only to samples taken after fasting for at least 8 hours.   BUN 18 8 - 23 mg/dL   Creatinine, Ser 1.61 0.44 - 1.00 mg/dL   Calcium 9.1 8.9 - 09.6 mg/dL   GFR, Estimated >04 >54 mL/min    Comment: (NOTE) Calculated using the CKD-EPI Creatinine Equation (2021)    Anion gap 16 (H) 5 - 15    Comment: Performed at [facility], [address], [place], [place] [address]  Magnesium     Status: Abnormal   Collection Time: 01/17/23  6:04 PM  Result Value Ref Range   Magnesium 2.5 (H) 1.7 - 2.4 mg/dL    Comment: Performed at [facility], [address] [place], [place] [address]  CBC     Status: Abnormal   Collection Time: 01/17/23  6:04 PM  Result Value Ref Range   WBC 3.6 (L) 4.0 - 10.5 K/uL   RBC 4.83 3.87 - 5.11 MIL/uL   Hemoglobin 13.4 12.0 - 15.0 g/dL   HCT 62.1 30.8 - 65.7 %   MCV 86.5 80.0 - 100.0 fL   MCH 27.7 26.0 - 34.0 pg   MCHC 32.1 30.0 - 36.0 g/dL   RDW 84.6 96.2 - 95.2 %   Platelets 212 150 - 400 K/uL   nRBC 0.0 0.0 - 0.2 %    Comment: Performed at [facility], [address], [place], [place] [address]  SARS Coronavirus 2 by RT PCR (hospital order, performed in [facility] hospital lab) *cepheid single result test* Anterior Nasal Swab     Status: None   Collection Time: 01/18/23 10:00 AM   Specimen: Anterior Nasal Swab  Result Value Ref Range   SARS Coronavirus 2 by RT PCR NEGATIVE NEGATIVE    Comment: (NOTE) SARS-CoV-2 target nucleic acids are NOT DETECTED.  The SARS-CoV-2 RNA is generally detectable in upper and lower respiratory specimens during the acute phase of infection. The lowest concentration of SARS-CoV-2 viral copies this assay can detect is 250 copies / mL. A  negative result does not preclude SARS-CoV-2 infection and should not be used as the sole basis for treatment or other patient management decisions.  A negative result may occur with improper specimen collection / handling, submission of specimen other than nasopharyngeal swab, presence of viral mutation(s) within the areas targeted by this assay, and inadequate number of viral copies (<250 copies / mL). A negative result must be combined with clinical observations, patient history, and epidemiological information.  Fact Sheet for Patients:   RoadLapTop.co.za  Fact Sheet for Healthcare Providers: http://kim-miller.com/  This test is not yet approved or  cleared by the [place] FDA and has been authorized for detection and/or diagnosis of SARS-CoV-2 by FDA under an Emergency Use Authorization (EUA).  This EUA will remain in effect (meaning this test can be used) for the duration of the COVID-19 declaration under Section 564(b)(1) of the Act, 21 U.S.C. section 360bbb-3(b)(1), unless the authorization is terminated or revoked sooner.  Performed at [facility], [address], [place], [place] [address]   Basic metabolic panel     Status: Abnormal   Collection Time: 01/18/23 10:04 AM  Result Value Ref Range   Sodium 144 135 - 145 mmol/L   Potassium 2.9 (L) 3.5 - 5.1 mmol/L   Chloride 100 98 - 111 mmol/L   CO2 26 22 - 32 mmol/L   Glucose, Bld 84 70 - 99 mg/dL    Comment: Glucose reference range applies only to samples taken after fasting for at least 8 hours.   BUN 18 8 - 23 mg/dL   Creatinine, Ser 2.72 0.44 - 1.00 mg/dL   Calcium 9.3 8.9 - 53.6 mg/dL   GFR, Estimated >64 >40 mL/min    Comment: (NOTE) Calculated using the CKD-EPI Creatinine Equation (2021)    Anion gap 18 (H) 5 - 15    Comment: Performed  at [facility], [address], [place], [place] [address]    Blood Alcohol level:   Lab Results  Component Value Date   [facility] <10 01/08/2023   ETH <10 03/29/2022    Physical Findings:  CIWA:    COWS:     Musculoskeletal:  Observed resting in bed.   Psychiatric Specialty Exam:  Presentation  General Appearance:  Disheveled  Eye Contact: Poor  Speech: Clear and Coherent  Speech Volume: Increased  Handedness: Right   Mood and Affect  Mood: Irritable  Affect: Flat   Thought Process  Thought Processes: Other (comment) (UTA)  Descriptions of Associations:-- (UTA)  Orientation:Other (comment) (UTA)  Thought Content:Other (comment) (UTA)  History of Schizophrenia/Schizoaffective disorder:Yes  Duration of Psychotic Symptoms:[name]  Hallucinations:No data recorded  Ideas of Reference:Other (comment) (UTA)  Suicidal Thoughts:Suicidal Thoughts: No (UTA)  Homicidal Thoughts:No data recorded   Sensorium  Memory: Immediate Poor; Remote Poor  Judgment: Impaired  Insight: Lacking   Executive Functions  Concentration: Poor  Attention Span: Poor  Recall: Poor  Fund of Knowledge: Poor  Language: Fair   Psychomotor Activity  Psychomotor Activity: Psychomotor Activity: Normal   Assets  Assets: Communication Skills; Social Support   Sleep  Sleep: Sleep: Fair    Physical Exam: Physical Exam Vitals and nursing note reviewed. Exam conducted with a chaperone present.  Neurological:     Mental Status: She is alert.  Psychiatric:        Mood and Affect: Affect is angry.        Speech: Speech normal.        Behavior: Behavior is agitated.        Thought Content: Thought content is delusional.        Cognition and Memory: Cognition is impaired.        Judgment: Judgment is inappropriate.    Review of Systems  Psychiatric/Behavioral:  Positive for hallucinations.    Blood pressure 112/67, pulse (!) 50, temperature 97.9 F (36.6 C), temperature source Axillary, resp. rate 16, height 5\' 4"  (1.626 m), weight  40.4 kg, SpO2 99 %. Body mass index is 15.29 kg/m.  Medical Decision Making: Patient continues to require inpatient Psychiatric hospitalization however she is being evaluated for Medical care and while admitted Psychiatry will manage her Psychiatry needs.  Forced Medication protocol will be initiated. Earney Navy, NP-PMHNP-BC 01/18/2023, 6:39 PM

## 2023-01-18 NOTE — Progress Notes (Addendum)
Inpatient Behavioral Health Placement  Due to pt's disposition continuing the recommendation for Inpatient Behavioral Health Placement [name]) per Earney Navy, NP-PMHNP-BC this CSW has completed verbal demographics with [name] with Intake at [facility]).  There are no available beds within the [facility].  Pt has been denied at: [facility] [place]  CSW called [facility] phone [phone]; fax [phone] and left a message requesting follow-up.  Disposition team to follow back up with [facility] TOMORROW 01/19/23 to inquire about pt being officially placed on the [facility] wait list.  PENDING items [facility] referral form to be completed.   [name], MSW, LCSWA 01/18/2023 10:12 PM

## 2023-01-18 NOTE — Progress Notes (Addendum)
ADDENDUM  2:27 PM - CSW received return phone call from Vernona Rieger, Clinical cytogeneticist, at [facility]. Per [facility] Attending Psychiatrist, Ann Lions, MD, pt has been denied due to pt being not medically stable for unit. Per [name], NP, team is working to obtain palliative care consult.  9:28 AM - CSW received phone call from Vernona Rieger, Counsellor, at [facility]. Vernona Rieger inquired about pt still needing inpatient treatment. CSW reports that [facility] denied pt due to failure to thrive and medical complexities. Vernona Rieger reports to CSW that pt was not formally denied by [facility] but needs to be further reviewed by medical provider. Vernona Rieger advised CSW to fax over weekend progress notes and IVC paperwork for inpatient medical provider to review. CSW sent requested paperwork to [facility] via fax. CSW will await follow up.  Cathie Beams, [place]  01/18/2023 9:46 AM

## 2023-01-18 NOTE — Consult Note (Signed)
                                                                                   Consultation Note Date: 01/18/2023   Patient Name: [name]  DOB: 1960-11-16  MRN: [id number]  Age / Sex: 62 y.o., female  [facility] Referring Physician: Default, Provider, MD  Reason for Consultation: "Patient is not eating, not drinking and not bathing. In bed all day. Not communication with staff. Lost tremendous amoung of weight. Legal Guardian is Engineer, maintenance. Phone number is [phone]."  HPI/Patient Profile: 62 y.o. female  with past medical history of paranoid schizophrenia admitted on 01/08/2023 due to psychotic episode and IVC. She is not eating or drinking due to feeling the food is poisonous. She is refusing medications. Inpatient psych treatment has been recommended. Palliative consulted for goals of care.  Primary Decision Maker LEGAL GUARDIAN - [name]  Discussion: Chart reviewed including labs, progress notes, imaging from this and previous encounters.  Patient has been referred to multiple inpatient institutions but has not been accepted.  She has had inpatient psychiatric hospitalizations in the past for not eating and drinking- this is part of her paranoia pathology.  On evaluation, she was thin, alert and awake. She did not engage with me. She repeatedly said, "I want to go home".  I called her legal guardian- left message requesting return call.     SUMMARY OF RECOMMENDATIONS -Agree with recommendations for inpatient psychiatric care    Code Status/Advance Care Planning: Full code   Prognosis:   Unable to determine  Discharge Planning: To Be Determined  Primary Diagnoses: Present on Admission:  Paranoid schizophrenia (HCC)   Review of Systems  Physical Exam  Vital Signs: BP 112/67 (BP Location: Left Arm)   Pulse (!) 50   Temp 97.9 F (36.6 C) (Axillary)   Resp 16   Ht 5\' 4"  (1.626 m)   Wt 40.4 kg   SpO2 99%   BMI 15.29 kg/m  Pain  Scale: 0-10   Pain Score: 0-No pain   SpO2: SpO2: 99 % O2 Device:SpO2: 99 % O2 Flow Rate: .   IO: Intake/output summary: No intake or output data in the 24 hours ending 01/18/23 1543  LBM: Last BM Date : 01/13/23 Baseline Weight: Weight: 40.4 kg Most recent weight: Weight: 40.4 kg       Thank you for this consult. Palliative medicine will continue to follow and assist as needed.  Time Total: 60 minutes Greater than 50%  of this time was spent counseling and coordinating care related to the above assessment and plan.  Signed by: Ocie Bob, AGNP-C Palliative Medicine    Please contact Palliative Medicine Team phone at [phone] for questions and concerns.  For individual provider: See Loretha Stapler

## 2023-01-18 NOTE — ED Provider Notes (Signed)
Emergency Medicine Observation Re-evaluation Note  [name] is a 62 y.o. female, seen on rounds today.  Pt initially presented to the ED for complaints of IVC Currently, the patient is asleep.  Physical Exam  BP 98/66 (BP Location: Left Arm)   Pulse (!) 54   Temp 97.9 F (36.6 C) (Axillary)   Resp 16   Ht 5\' 4"  (1.626 m)   Wt 40.4 kg   SpO2 99%   BMI 15.29 kg/m  Physical Exam General: Asleep, no acute distress Cardiac: Regular rate Lungs: No increased work of breathing Psych: Calm, asleep  ED Course / MDM  EKG:EKG Interpretation Date/Time:  Sunday January 17 2023 20:16:16 EDT Ventricular Rate:  45 PR Interval:  126 QRS Duration:  72 QT Interval:  664 QTC Calculation: 574 R Axis:   -33  Text Interpretation: Critical Test Result: Long QTc Sinus bradycardia Left axis deviation Inferior infarct , age undetermined T wave abnormality, consider anterolateral ischemia Prolonged QT Abnormal ECG No significant change since last tracing Confirmed by Elayne Snare ([id number]) on 01/18/2023 8:05:31 AM  I have reviewed the labs performed to date as well as medications administered while in observation.  Recent changes in the last 24 hours include Agitated overnight requiring haldol. Refusing potassium supplement.  Plan  Current plan is for Inpatient psych.    Rexford Maus, DO 01/18/23 [id number]

## 2023-01-18 NOTE — ED Notes (Signed)
ED TO INPATIENT HANDOFF REPORT  ED Nurse Name and Phone #: Thamas Jaegers Name/Age/Gender [name] 62 y.o. female Room/Bed: WA27/WA27  Code Status   Code Status: Prior  Home/SNF/Other Home Patient oriented to: self, place, time, and situation Is this baseline? Yes   Triage Complete: Triage complete  Chief Complaint Hypokalemia [E87.6]  Triage Note Patient brought in by police due to patient being IVC'ed. Patient has not been eating or sleeping. Has a history of schizophrenia. Pt believes someone is tampering with her food and she shut off the air conditioner at her house because she thinks the air from outside is infecting her and her 76 year old mother. Pt is hyperverbal and non-compliant with medications. Pt is speaking to unknown source at this time.   Allergies Allergies  Allergen Reactions   Aspirin Hives, Nausea Only and Other (See Comments)    "Possibly caused hives," per sister and caused "STOMACH UPSET"   Ziprasidone Hcl Hives, Rash and Other (See Comments)    GEODON = "Caused twitching and redness of eyes" (also)    Level of Care/Admitting Diagnosis ED Disposition     ED Disposition  Admit   Condition  --   Comment  Hospital Area: [facility] [[id number]]  Level of Care: Telemetry [5]  Admit to tele based on following criteria: Monitor QTC interval  Admit to tele based on following criteria: Other see comments  Comments: hypokalemia  May place patient in observation at [facility] or [facility] if equivalent level of care is available:: No  Covid Evaluation: Asymptomatic - no recent exposure (last 10 days) testing not required  Diagnosis: Hypokalemia [604540]  Admitting Physician: Charlane Ferretti [[id number]]  Attending Physician: Charlane Ferretti [[id number]]          B Medical/Surgery History Past Medical History:  Diagnosis Date   Anemia 10/02/2021   Non compliance w medication regimen    Schizophrenia (HCC)    Past Surgical History:   Procedure Laterality Date   BIOPSY  09/25/2021   Procedure: BIOPSY;  Surgeon: Rachael Fee, MD;  Location: [facility];  Service: Gastroenterology;;   ESOPHAGOGASTRODUODENOSCOPY (EGD) WITH PROPOFOL [name] 09/25/2021   Procedure: ESOPHAGOGASTRODUODENOSCOPY (EGD) WITH PROPOFOL;  Surgeon: Rachael Fee, MD;  Location: [facility];  Service: Gastroenterology;  Laterality: [name];  With NGT placement     A IV Location/Drains/Wounds Patient Lines/Drains/Airways Status     Active Line/Drains/Airways     None            Intake/Output Last 24 hours No intake or output data in the 24 hours ending 01/18/23 2011  Labs/Imaging Results for orders placed or performed during the hospital encounter of 01/08/23 (from the past 48 hour(s))  Basic metabolic panel     Status: Abnormal   Collection Time: 01/17/23  6:04 PM  Result Value Ref Range   Sodium 143 135 - 145 mmol/L   Potassium 2.9 (L) 3.5 - 5.1 mmol/L   Chloride 100 98 - 111 mmol/L   CO2 27 22 - 32 mmol/L   Glucose, Bld 74 70 - 99 mg/dL    Comment: Glucose reference range applies only to samples taken after fasting for at least 8 hours.   BUN 18 8 - 23 mg/dL   Creatinine, Ser 1.30 0.44 - 1.00 mg/dL   Calcium 9.1 8.9 - 86.5 mg/dL   GFR, Estimated >78 >46 mL/min    Comment: (NOTE) Calculated using the CKD-EPI Creatinine Equation (2021)    Anion gap 16 (  H) 5 - 15    Comment: Performed at [name], [address] [place], [place] [address]  Magnesium     Status: Abnormal   Collection Time: 01/17/23  6:04 PM  Result Value Ref Range   Magnesium 2.5 (H) 1.7 - 2.4 mg/dL    Comment: Performed at [facility], [address], [place], [place] [address]  CBC     Status: Abnormal   Collection Time: 01/17/23  6:04 PM  Result Value Ref Range   WBC 3.6 (L) 4.0 - 10.5 K/uL   RBC 4.83 3.87 - 5.11 MIL/uL   Hemoglobin 13.4 12.0 - 15.0 g/dL   HCT 65.7 84.6 - 96.2 %   MCV 86.5 80.0 - 100.0 fL   MCH  27.7 26.0 - 34.0 pg   MCHC 32.1 30.0 - 36.0 g/dL   RDW 95.2 84.1 - 32.4 %   Platelets 212 150 - 400 K/uL   nRBC 0.0 0.0 - 0.2 %    Comment: Performed at [facility], [address], [place], [place] [address]  SARS Coronavirus 2 by RT PCR (hospital order, performed in [facility] hospital lab) *cepheid single result test* Anterior Nasal Swab     Status: None   Collection Time: 01/18/23 10:00 AM   Specimen: Anterior Nasal Swab  Result Value Ref Range   SARS Coronavirus 2 by RT PCR NEGATIVE NEGATIVE    Comment: (NOTE) SARS-CoV-2 target nucleic acids are NOT DETECTED.  The SARS-CoV-2 RNA is generally detectable in upper and lower respiratory specimens during the acute phase of infection. The lowest concentration of SARS-CoV-2 viral copies this assay can detect is 250 copies / mL. A negative result does not preclude SARS-CoV-2 infection and should not be used as the sole basis for treatment or other patient management decisions.  A negative result may occur with improper specimen collection / handling, submission of specimen other than nasopharyngeal swab, presence of viral mutation(s) within the areas targeted by this assay, and inadequate number of viral copies (<250 copies / mL). A negative result must be combined with clinical observations, patient history, and epidemiological information.  Fact Sheet for Patients:   RoadLapTop.co.za  Fact Sheet for Healthcare Providers: http://kim-miller.com/  This test is not yet approved or  cleared by the [place] FDA and has been authorized for detection and/or diagnosis of SARS-CoV-2 by FDA under an Emergency Use Authorization (EUA).  This EUA will remain in effect (meaning this test can be used) for the duration of the COVID-19 declaration under Section 564(b)(1) of the Act, 21 U.S.C. section 360bbb-3(b)(1), unless the authorization is terminated or revoked  sooner.  Performed at [facility], [address] [place], [place] [address]   Basic metabolic panel     Status: Abnormal   Collection Time: 01/18/23 10:04 AM  Result Value Ref Range   Sodium 144 135 - 145 mmol/L   Potassium 2.9 (L) 3.5 - 5.1 mmol/L   Chloride 100 98 - 111 mmol/L   CO2 26 22 - 32 mmol/L   Glucose, Bld 84 70 - 99 mg/dL    Comment: Glucose reference range applies only to samples taken after fasting for at least 8 hours.   BUN 18 8 - 23 mg/dL   Creatinine, Ser 6.44 0.44 - 1.00 mg/dL   Calcium 9.3 8.9 - 03.4 mg/dL   GFR, Estimated >74 >25 mL/min    Comment: (NOTE) Calculated using the CKD-EPI Creatinine Equation (2021)  Anion gap 18 (H) 5 - 15    Comment: Performed at [facility], [address], [place], [place] [address]   No results found.  Pending Labs Unresulted Labs (From admission, onward)    None       Vitals/Pain Today's Vitals   01/17/23 1412 01/17/23 2019 01/18/23 0700 01/18/23 1449  BP: 117/67 122/89 98/66 112/67  Pulse: (!) 52 81 (!) 54 (!) 50  Resp: 16   16  TempSrc:      SpO2: 100% 91% 99% 99%  Weight:      Height:      PainSc:  0-No pain      Isolation Precautions Airborne and Contact precautions  Medications Medications  potassium chloride 10 mEq in 100 mL IVPB (10 mEq Intravenous Not Given 01/09/23 0754)  potassium chloride (KLOR-CON) packet 20 mEq (20 mEq Oral Patient Refused/Not Given 01/18/23 0955)  LORazepam (ATIVAN) tablet 1 mg (has no administration in time range)    Or  LORazepam (ATIVAN) injection 2 mg (has no administration in time range)  potassium chloride SA (KLOR-CON M) CR tablet 40 mEq (40 mEq Oral Not Given 01/17/23 1940)  lactated ringers infusion (has no administration in time range)  LORazepam (ATIVAN) injection 2 mg (2 mg Intramuscular Given 01/08/23 1812)  diphenhydrAMINE (BENADRYL) injection 50 mg (50 mg Intramuscular Given 01/08/23 1811)  lactated ringers bolus 1,000  mL (0 mLs Intravenous Stopped 01/09/23 0152)  ketamine (KETALAR) injection 100 mg (100 mg Intramuscular Given 01/08/23 2250)  0.9 %  sodium chloride infusion (0 mLs Intravenous Stopped 01/09/23 0754)  potassium chloride 10 mEq in 100 mL IVPB (0 mEq Intravenous Stopped 01/09/23 0657)  haloperidol (HALDOL) tablet 5 mg ( Oral See Alternative 01/17/23 2024)    Or  haloperidol lactate (HALDOL) injection 5 mg (5 mg Intramuscular Given 01/17/23 2024)    Mobility walks     Focused Assessments     R Recommendations: See Admitting Provider Note  Report given to:   Additional Notes:

## 2023-01-18 NOTE — ED Notes (Signed)
Dr Thornell Mule contacted via messaging informed of EKG done this AM , no need to repeat per his text message. Dr was also informed that pt is waiting for an ER bed with a monitor  before K+ runners could be infused no new orders obtained charge nurse also aware.

## 2023-01-18 NOTE — H&P (Signed)
History and Physical    [name] ZOX:[id number] DOB: 1961/02/06 DOA: 01/08/2023  PCP: [facility]   Patient coming from: home, has legal guardian  Chief Complaint: long qtc, hypokalemia, malniutrition  HPI: [name] is a 62 y.o. female with a pertinent history of  paranoid schizophrenia, schizoaffective disorder bipolar type, anemia, malnutrition and noncompliant presented to the emergency department for evaluation of acute psychosis.  Her sister, I think the legal guardian, [name], brought patient and IVC was done.  Apparently, she is afraid of people contaminating her food. only eating bacon or packaged things that she opens herself, or opens her own water. Similar presentation back September 2023 due to patient not eating or taking any medications and said that patient stayed in the hospital until December 2023. tried ECT in the past and it worked but can't do it again because of her heart.  Paula "they said they almost lost her" w ECT.  And this is why she became legal guardian back in 2018 as this is an ongoing problem with the patient.  please refer to psych note for more information. Paula states that she has been okay with IV fluids in the past.     Most was by reviewing the chart, patient is difficult to ascertain more information.  She states she wants to go home and can take care of herself at home.   Has been in the ED since 6/21 awaiting psych placement but I am told difficulty because she is not eating and drinking.  Also, difficulty with hypokalemia and Prolonged Qtc, bradycardia.  Idea of using K powder.  Haldol dose on 6/30    I think palliative care was involved as well and I was told not a good palliative care candidate.  Nurse notes to be hallucinating and agitated at times.  I spoke with an overnight colleague on the phone and thought reasonable to accept to medicine given difficulties to get into facility and these persistent medical issues.   Hospitalists were consulted on 6/21 to decide on psych vs. hospitalist admission.  Currently, HR 50, RR 16, 98/66 blood pressure, 99% on room air.  Still hypokalemic at 2.9, CO2 26, SCR 0.86, anion gap 18.  Magnesium 2.5 on 6/3.  WBC 33.6, Hgb 13.4, PLT 212  Review of Systems: As per HPI otherwise 10 point review of systems negative. Unable to ask a full review of symptoms because of mental status but she states she doesn't have any pain. Other pertinents as below:  General -  HEENT -  Cardio -  Resp -  GI -  GU -  MSK -  Skin -  Neuro -  Psych -    Past Medical History:  Diagnosis Date   Anemia 10/02/2021   Non compliance w medication regimen    Schizophrenia [facility])     Past Surgical History:  Procedure Laterality Date   BIOPSY  09/25/2021   Procedure: BIOPSY;  Surgeon: Paula Fee, MD;  Location: [facility];  Service: Gastroenterology;;   ESOPHAGOGASTRODUODENOSCOPY (EGD) WITH PROPOFOL [name] 09/25/2021   Procedure: ESOPHAGOGASTRODUODENOSCOPY (EGD) WITH PROPOFOL;  Surgeon: Paula Fee, MD;  Location: [facility];  Service: Gastroenterology;  Laterality: [name];  With NGT placement     reports that she has been smoking cigarettes. She has been smoking an average of 2 packs per day. She has been exposed to tobacco smoke. She has never used smokeless tobacco. She reports that she does not currently use alcohol. She  reports that she does not use drugs.  Allergies  Allergen Reactions   Aspirin Hives, Nausea Only and Other (See Comments)    "Possibly caused hives," per sister and caused "STOMACH UPSET"   Ziprasidone Hcl Hives, Rash and Other (See Comments)    GEODON = "Caused twitching and redness of eyes" (also)    History reviewed. No pertinent family history.    Prior to Admission medications   Medication Sig Start Date End Date Taking? Authorizing Provider  haloperidol (HALDOL) 5 MG tablet Take 1 tablet (5 mg total) by mouth 2 (two) times daily. Patient not taking:  Reported on 01/13/2023 08/06/22   [name], Paula Denmark, MD  lithium carbonate 300 MG capsule Take 2 capsules (600 mg total) by mouth at bedtime. Patient not taking: Reported on 01/13/2023 08/06/22   [name], Paula Denmark, MD  mirtazapine (REMERON) 30 MG tablet Take 1 tablet (30 mg total) by mouth at bedtime. Patient not taking: Reported on 01/13/2023 08/06/22   [name], Paula Denmark, MD  Multiple Vitamin (MULTIVITAMIN WITH MINERALS) TABS tablet Take 1 tablet by mouth daily. Patient not taking: Reported on 01/13/2023 08/06/22   [name], Paula Denmark, MD  nicotine (NICODERM CQ - DOSED IN MG/24 HR) 7 mg/24hr patch Place 1 patch (7 mg total) onto the skin daily. (Remove old patch before applying new patch). Patient not taking: Reported on 01/13/2023 08/06/22   [name], Paula Denmark, MD  OLANZapine (ZYPREXA) 15 MG tablet Take 2 tablets (30 mg total) by mouth at bedtime. Patient not taking: Reported on 01/13/2023 08/06/22   [name], Paula Denmark, MD  QUEtiapine (SEROQUEL) 400 MG tablet Take 1 tablet (400 mg total) by mouth at bedtime. Patient not taking: Reported on 01/13/2023 08/06/22   [name], Paula Denmark, MD  thiamine (VITAMIN B1) 100 MG tablet Take 1 tablet (100 mg total) by mouth daily. Patient not taking: Reported on 01/13/2023 08/06/22   Paula Amel, MD    Physical Exam: Vitals:   01/17/23 1412 01/17/23 2019 01/18/23 0700 01/18/23 1449  BP: 117/67 122/89 98/66 112/67  Pulse: (!) 52 81 (!) 54 (!) 50  Resp: 16   16  TempSrc:      SpO2: 100% 91% 99% 99%  Weight:      Height:       Limited physical exam because of potential agitation  Constitutional: NAD, comfortable, thin, paranoid Eyes: extraocular motion grossly intact ENMT: dry mucus membranes Neck:   Respiratory: breathing normally, no cough noted Cardiovascular: well perfused appearing, extremities warm Abdomen:  Musculoskeletal: very thin appearing, moving her arms. Skin: no rashes, lesions, ulcers noted Neurologic: neurologically grossly intact Psychiatric: A and mostly  oriented appearing but judgement and mentation not intact     Labs on Admission: I have personally reviewed following labs and imaging studies  CBC: Recent Labs  Lab 01/17/23 1804  WBC 3.6*  HGB 13.4  HCT 41.8  MCV 86.5  PLT 212   Basic Metabolic Panel: Recent Labs  Lab 01/13/23 0951 01/17/23 1804 01/18/23 1004  NA 137 143 144  K 3.5 2.9* 2.9*  CL 96* 100 100  CO2 19* 27 26  GLUCOSE 90 74 84  BUN 13 18 18   CREATININE 0.89 0.80 0.86  CALCIUM 9.4 9.1 9.3  MG  --  2.5*  --    GFR: Estimated Creatinine Clearance: 43.3 mL/min (by C-G formula based on SCr of 0.86 mg/dL). Liver Function Tests: Recent Labs  Lab 01/13/23 0951  AST 21  ALT 13  ALKPHOS 50  BILITOT 1.3*  PROT 8.3*  ALBUMIN 4.7   No results for input(s): "LIPASE", "AMYLASE" in the last 168 hours. No results for input(s): "AMMONIA" in the last 168 hours. Coagulation Profile: No results for input(s): "INR", "PROTIME" in the last 168 hours. Cardiac Enzymes: No results for input(s): "CKTOTAL", "CKMB", "CKMBINDEX", "TROPONINI" in the last 168 hours. BNP (last 3 results) No results for input(s): "PROBNP" in the last 8760 hours. HbA1C: No results for input(s): "HGBA1C" in the last 72 hours. CBG: No results for input(s): "GLUCAP" in the last 168 hours. Lipid Profile: No results for input(s): "CHOL", "HDL", "LDLCALC", "TRIG", "CHOLHDL", "LDLDIRECT" in the last 72 hours. Thyroid Function Tests: No results for input(s): "TSH", "T4TOTAL", "FREET4", "T3FREE", "THYROIDAB" in the last 72 hours. Anemia Panel: No results for input(s): "VITAMINB12", "FOLATE", "FERRITIN", "TIBC", "IRON", "RETICCTPCT" in the last 72 hours. Urine analysis:    Component Value Date/Time   COLORURINE STRAW (A) 01/09/2023 0725   APPEARANCEUR CLEAR 01/09/2023 0725   LABSPEC 1.006 01/09/2023 0725   PHURINE 7.0 01/09/2023 0725   GLUCOSEU NEGATIVE 01/09/2023 0725   HGBUR MODERATE (A) 01/09/2023 0725   BILIRUBINUR NEGATIVE 01/09/2023  0725   KETONESUR 20 (A) 01/09/2023 0725   PROTEINUR NEGATIVE 01/09/2023 0725   UROBILINOGEN 0.2 04/10/2015 1111   NITRITE NEGATIVE 01/09/2023 0725   LEUKOCYTESUR TRACE (A) 01/09/2023 0725    Radiological Exams on Admission: No results found.  EKG: Independently reviewed.    Assessment/Plan Principal Problem:   Paranoid schizophrenia (HCC) Active Problems:   Hypokalemia   Prolonged QTc, upper 500's, ,previously thought due to antipsychotics but wonder if component of hypokalemi so hopefully can improve this and help to allow for some antipsychotics. Dr. Lucianne Muss and NP Julieanne Cotton who has been talking with palliative care .  repeat EKG, then daily ekg's Precautions Checking other electrolytes  Hypokalemia Insetting of malnutrition, encourage better PO intake Checking magnesium IV potassium x3, try for oral as well as patient allows Lactated ringers 73mL/hr til the am  not responding, won't communicate, from the schizophrenia.   psych order  Paranoid Schizophrenia IVC til 01/22/2023  IVC'ed, continue precautions continuing psychs orders and defer antipsychotics to psychiatry. because of IVC status I am told can use physical restraints if needed --psych team had already left for the day but recommend collaborating with psychiatry in the morning.  Leukopenia, wonder if from vitamin or mineral deficiency encourage good nutrition Ctm  Bradycardic - ctm, unclear significance  Anion gap metabolic acidosis-continue to monitor  Patient and/or Family completely agreed with the plan, expressed understanding and I answered all questions.  DVT prophylaxis: Lovenox SQ  I think because of her weight needs to be lower dose.  I consulted pharm on this question Code Status: DNR  01/18/2023----15 minute conversation - After a prolonged conversation the legal guardian, Paula, understanding her risk of prolonged QTc said that she was would not want chest compressions or to be intubated but  was okay with shocks if it was needed.  She thought that her quality of life currently was not sufficient for CPR or intubation after talking about the risks and benefits of this.  I said this was reasonable and I would document this in the chart.  I answered all questions, legal guarding completely agree with the plan expressed understanding. Family Communication: Paula the legal guadian Disposition Plan: psychiatric unit Consults called: psych already aware (with names) Admission status: observation because      A total of 90 minutes utilized during this admission.  Charlane Ferretti DO [facility]   If 7PM-7AM, please contact night-coverage [email or web address] Password [facility]  01/18/2023, 8:06 PM

## 2023-01-18 NOTE — Consult Note (Signed)
  Called Palliative care at [phone] Palliative care consult.  Nobody answered the call.  Message left for call back.

## 2023-01-18 NOTE — ED Notes (Signed)
Pt refuses to let me get temp. Advised that temp is needed its been 103 hours since her last temp was taken. Pt said " it doesn't matter you already got the majority". My Temp is always 98.6. Asked patient if I could take temp axillary still refused.

## 2023-01-19 ENCOUNTER — Encounter (HOSPITAL_COMMUNITY): Payer: Self-pay | Admitting: Internal Medicine

## 2023-01-19 DIAGNOSIS — Z7189 Other specified counseling: Secondary | ICD-10-CM | POA: Diagnosis not present

## 2023-01-19 DIAGNOSIS — R630 Anorexia: Secondary | ICD-10-CM | POA: Diagnosis not present

## 2023-01-19 DIAGNOSIS — E876 Hypokalemia: Secondary | ICD-10-CM | POA: Diagnosis not present

## 2023-01-19 DIAGNOSIS — R9431 Abnormal electrocardiogram [ECG] [EKG]: Secondary | ICD-10-CM | POA: Diagnosis not present

## 2023-01-19 DIAGNOSIS — F200 Paranoid schizophrenia: Secondary | ICD-10-CM | POA: Diagnosis not present

## 2023-01-19 LAB — COMPREHENSIVE METABOLIC PANEL
ALT: 10 U/L (ref 0–44)
AST: 18 U/L (ref 15–41)
Albumin: 3.7 g/dL (ref 3.5–5.0)
Alkaline Phosphatase: 44 U/L (ref 38–126)
Anion gap: 14 (ref 5–15)
BUN: 17 mg/dL (ref 8–23)
CO2: 25 mmol/L (ref 22–32)
Calcium: 9 mg/dL (ref 8.9–10.3)
Chloride: 101 mmol/L (ref 98–111)
Creatinine, Ser: 0.71 mg/dL (ref 0.44–1.00)
GFR, Estimated: >60 mL/min (ref >60)
Glucose, Bld: 76 mg/dL (ref 70–99)
Potassium: 4.1 mmol/L (ref 3.5–5.1)
Sodium: 140 mmol/L (ref 135–145)
Total Bilirubin: 1.1 mg/dL (ref 0.3–1.2)
Total Protein: 7.1 g/dL (ref 6.5–8.1)

## 2023-01-19 LAB — CBC
HCT: 41.8 % (ref 36.0–46.0)
Hemoglobin: 13.3 g/dL (ref 12.0–15.0)
MCH: 27.5 pg (ref 26.0–34.0)
MCHC: 31.8 g/dL (ref 30.0–36.0)
MCV: 86.5 fL (ref 80.0–100.0)
Platelets: 197 10*3/uL (ref 150–400)
RBC: 4.83 MIL/uL (ref 3.87–5.11)
RDW: 14 % (ref 11.5–15.5)
WBC: 3.5 10*3/uL — ABNORMAL LOW (ref 4.0–10.5)
nRBC: 0 % (ref 0.0–0.2)

## 2023-01-19 LAB — PHOSPHORUS: Phosphorus: 3.5 mg/dL (ref 2.5–4.6)

## 2023-01-19 LAB — TSH: TSH: 0.396 u[IU]/mL (ref 0.350–4.500)

## 2023-01-19 LAB — HIV ANTIBODY (ROUTINE TESTING W REFLEX): HIV Screen 4th Generation wRfx: NONREACTIVE

## 2023-01-19 LAB — MAGNESIUM: Magnesium: 2.2 mg/dL (ref 1.7–2.4)

## 2023-01-19 MED ORDER — ACETAMINOPHEN 325 MG PO TABS: 650.0000 mg | ORAL_TABLET | Freq: Four times a day (QID) | ORAL | Status: DC | PRN

## 2023-01-19 MED ORDER — ACETAMINOPHEN 650 MG RE SUPP: 650.0000 mg | Freq: Four times a day (QID) | RECTAL | Status: DC | PRN

## 2023-01-19 MED ORDER — ADULT MULTIVITAMIN W/MINERALS CH
1.0000 | ORAL_TABLET | Freq: Every day | ORAL | Status: DC
  Administered 2023-01-19 – 2023-01-22 (×4): 1 via ORAL
  Filled 2023-01-19 (×4): qty 1

## 2023-01-19 MED ORDER — THIAMINE MONONITRATE 100 MG PO TABS
100.0000 mg | ORAL_TABLET | Freq: Every day | ORAL | Status: DC
  Administered 2023-01-19 – 2023-01-22 (×4): 100 mg via ORAL
  Filled 2023-01-19 (×8): qty 1

## 2023-01-19 MED ORDER — POLYETHYLENE GLYCOL 3350 17 G PO PACK: 17.0000 g | PACK | Freq: Every day | ORAL | Status: DC | PRN

## 2023-01-19 NOTE — Consult Note (Signed)
Cardiology Consultation   Patient ID: [name] MRN: [id number]; DOB: 07-31-1960  Admit date: 01/08/2023 Date of Consult: 01/19/2023  PCP:  [facility]   [name] Cardiologist:  Dr Mariah Milling   Patient Profile:   [name] is a 62 y.o. female with a hx of paranoid schizophrenia, schizoaffective disorder, bipolar disorder, malnutrition, noncompliance who is being seen 01/19/2023 for the evaluation of prolonged QT interval at the request of Paula Junker MD.  History of Present Illness:   Long history of psychiatric disorders.  She has had difficulties with compliance.  In March 2023 she was transferred to [facility] for ECT therapy.  During treatment on October 13, 2021 she apparently developed ventricular tachycardia (rate 170).  The strips are not available for review.  Her electrocardiogram at that time did show anterior T wave inversion which is a chronic issue. Echocardiogram during that admission showed normal LV function, grade 1 diastolic dysfunction.  Nuclear study showed no ischemia or infarction with normal perfusion.  It was felt the ventricular arrhythmia was likely due to catecholamine surge during ECT.  Records were not added due to baseline bradycardia and hypotension.   She has now been admitted with acute psychosis.  Initial potassium was 2.3.  Her electrocardiogram has shown prolonged QT interval.  Cardiology asked to evaluate.  Note at the time of my evaluation she is very uncooperative.  She denies dyspnea, chest pain, palpitations or history of syncope.  She states "you all need to quit making stuff up".   Past Medical History:  Diagnosis Date   Anemia 10/02/2021   Non compliance w medication regimen    Schizophrenia [facility])     Past Surgical History:  Procedure Laterality Date   BIOPSY  09/25/2021   Procedure: BIOPSY;  Surgeon: Rachael Fee, MD;  Location: [facility];  Service: Gastroenterology;;   ESOPHAGOGASTRODUODENOSCOPY (EGD) WITH  PROPOFOL [name] 09/25/2021   Procedure: ESOPHAGOGASTRODUODENOSCOPY (EGD) WITH PROPOFOL;  Surgeon: Rachael Fee, MD;  Location: [facility];  Service: Gastroenterology;  Laterality: [name];  With NGT placement     Inpatient Medications: Scheduled Meds:  enoxaparin (LOVENOX) injection  30 mg Subcutaneous Q24H   multivitamin with minerals  1 tablet Oral Daily   potassium chloride  20 mEq Oral BID   potassium chloride  40 mEq Oral Once   thiamine  100 mg Oral Daily   Continuous Infusions:  PRN Meds: acetaminophen **OR** acetaminophen, LORazepam **OR** LORazepam, polyethylene glycol  Allergies:    Allergies  Allergen Reactions   Aspirin Hives, Nausea Only and Other (See Comments)    "Possibly caused hives," per sister and caused "STOMACH UPSET"   Ziprasidone Hcl Hives, Rash and Other (See Comments)    GEODON = "Caused twitching and redness of eyes" (also)    Social History:   Social History   Socioeconomic History   Marital status: Single    Spouse name: Not on file   Number of children: Not on file   Years of education: Not on file   Highest education level: Not on file  Occupational History   Not on file  Tobacco Use   Smoking status: Every Day    Packs/day: 2    Types: Cigarettes    Passive exposure: Current   Smokeless tobacco: Never  Vaping Use   Vaping Use: Never used  Substance and Sexual Activity   Alcohol use: Not Currently    Comment: Refuses to disclose how much   Drug use: No  Comment: Refuses to answer   Sexual activity: Not Currently    Comment: refused to answer  Other Topics Concern   Not on file  Social History Narrative   Not on file   Social Determinants of Health   Financial Resource Strain: Not on file  Food Insecurity: No Food Insecurity (01/19/2023)   Hunger Vital Sign    Worried About Running Out of Food in the Last Year: Never true    Ran Out of Food in the Last Year: Never true  Transportation Needs: No Transportation Needs (01/19/2023)    PRAPARE - Administrator, Civil Service (Medical): No    Lack of Transportation (Non-Medical): No  Physical Activity: Not on file  Stress: Not on file  Social Connections: Not on file  Intimate Partner Violence: Not At Risk (01/19/2023)   Humiliation, Afraid, Rape, and Kick questionnaire    Fear of Current or Ex-Partner: No    Emotionally Abused: No    Physically Abused: No    Sexually Abused: No    Family History:   Pt would not answer family history.  ROS:  Please see the history of present illness.  She states "I feel fine and I want to go home". All other ROS reviewed and negative.     Physical Exam/Data:   Vitals:   01/18/23 2353 01/19/23 0356 01/19/23 0500 01/19/23 0816  BP: 134/67 108/67  (!) 105/59  Pulse:  (!) 44  (!) 54  Resp: 16 16  18   Temp: 98.2 F (36.8 C) 98.2 F (36.8 C)  98.5 F (36.9 C)  TempSrc: Oral Oral  Oral  SpO2: 100% 100%  100%  Weight: 37.3 kg  37.3 kg   Height:        Intake/Output Summary (Last 24 hours) at 01/19/2023 1046 Last data filed at 01/19/2023 0800 Gross per 24 hour  Intake 1165.64 ml  Output --  Net 1165.64 ml      01/19/2023    5:00 AM 01/18/2023   11:53 PM 01/08/2023    5:06 PM  Last 3 Weights  Weight (lbs) 82 lb 3.7 oz 82 lb 3.7 oz 89 lb 1.1 oz  Weight (kg) 37.3 kg 37.3 kg 40.4 kg     Body mass index is 14.12 kg/m.  General:  Thin, well developed, in no acute distress HEENT: normal Neck: no JVD Vascular: No carotid bruits; Distal pulses 2+ bilaterally Cardiac:  normal S1, S2; RRR; no murmur  Lungs:  clear to auscultation bilaterally, no wheezing, rhonchi or rales  Abd: soft, nontender, no hepatomegaly  Ext: no edema Musculoskeletal:  No deformities, BUE and BLE strength normal and equal Skin: warm and dry  Neuro:  CNs 2-12 intact, no focal abnormalities noted Psych: Belligerent and uncooperative.  EKG:  The EKG was personally reviewed and demonstrates: Sinus rhythm with anterior T wave inversion and  prolonged QT interval. Telemetry:  Telemetry was personally reviewed and demonstrates: Sinus rhythm.   Laboratory Data:   Chemistry Recent Labs  Lab 01/17/23 1804 01/18/23 1004 01/19/23 0428  NA 143 144 140  K 2.9* 2.9* 4.1  CL 100 100 101  CO2 27 26 25   GLUCOSE 74 84 76  BUN 18 18 17   CREATININE 0.80 0.86 0.71  CALCIUM 9.1 9.3 9.0  MG 2.5*  --  2.2  GFRNONAA >60 >60 >60  ANIONGAP 16* 18* 14    Recent Labs  Lab 01/13/23 0951 01/19/23 0428  PROT 8.3* 7.1  ALBUMIN 4.7 3.7  AST 21 18  ALT 13 10  ALKPHOS 50 44  BILITOT 1.3* 1.1    Hematology Recent Labs  Lab 01/17/23 1804 01/19/23 0718  WBC 3.6* 3.5*  RBC 4.83 4.83  HGB 13.4 13.3  HCT 41.8 41.8  MCV 86.5 86.5  MCH 27.7 27.5  MCHC 32.1 31.8  RDW 14.2 14.0  PLT 212 197   Thyroid  Recent Labs  Lab 01/19/23 0504  TSH 0.396      Assessment and Plan:   Prolonged QT interval-patient has noted to have a prolonged QT interval.  This was in the setting of hypokalemia which has been corrected but QT prolongation persists.  Would repeat ECG prior to discharge.  Note her ECG has shown prolonged QT interval intermittently in the past.  She denies syncope.  Cannot add beta-blockade as she occasionally has heart rate in the mid to high 40s.  There is also difficulties with hypotension.  Would avoid all QT prolonging medications.  This would include Haldol, Zyprexa and Seroquel which are listed as her outpatient medications though apparently she was not taking these. History of wide-complex tachycardia during previous ECT-I do not have strips to review but apparently this was felt to be secondary to catecholamine surge at the time of ECT.  Her echocardiogram at that time showed normal LV function.  Her nuclear study showed no ischemia.  It is difficult to say what her risk of recurrence would be in the setting of repeat ECT.  However given prolonged QT interval and history of wide-complex tachycardia the risk would certainly  be higher.  Would avoid if possible. Paranoid schizophrenia/schizoaffective disorder/bipolar disorder-management per psychiatry.   No other recommendations.  Cardiology will sign off.  Please call with questions.  For questions or updates, please contact [facility] Please consult [email or web address] for contact info under    Signed, Olga Millers, MD  01/19/2023 10:46 AM

## 2023-01-19 NOTE — Progress Notes (Signed)
[facility] PROGRESS NOTE  [name] (DOB: 08-Sep-1960) ZOX:[id number] PCP: [facility]  Brief Narrative: [name] is a 62 y.o. female with a history of paranoid schizophrenia, schizoaffective, bipolar disorder, malnutrition (BMI 14) who presented to the ED on 01/08/2023 for evaluation of acute psychosis. She has been boarding in the ED undergoing psychiatric therapy awaiting inpatient psychiatry placement, but has not been eating or drinking so admission to the hospitalist service was requested. She was given haldol on 6/30 but is not regularly administered any antipsychotics due to QT prolongation. ECT has been considered, but felt to be high risk given report of wide complex tachycardia with this in the past.   Subjective: Ate all of breakfast per pt confirmed by sitter. She has no concerns this morning.  Objective: BP (!) 105/59   Pulse (!) 54   Temp 98.5 F (36.9 C) (Oral)   Resp 18   Ht 5\' 4"  (1.626 m)   Wt 37.3 kg   SpO2 100%   BMI 14.12 kg/m   Gen: No distress Pulm: Clear, nonlabored  CV: Regular bradycardia, no MRG or edema GI: Soft, NT, ND, +BS Neuro: Alert and oriented. No new focal deficits. Ext: Warm, decreased muscle bulk Skin: No rashes, lesions or ulcers on visualized skin   Assessment & Plan: Psychosis, paranoid schizophrenia:  - Appreciate psychiatry evaluation and management, d/w Dr. Gasper Sells this morning. Tough position with her history of WCT during ECT previously and current prolonged QT interval. Considering depakote.  - Awaiting placement to [facility] per notes.  Prolonged QT interval, sinus bradycardia: TSH 0.396. - Daily ECG, repeat this morning reviewed showing improvement in QTc to . - Avoid provocative medications as much as possible - I have consulted cardiology per psychiatry request to opine on TdP risk and risk of recurrent dysrhythmia during ECT.  - Keep K and Mg replete  Hypokalemia: Resolved with supplementation. - Keep  level >4  Leukopenia: With normal differential, no evidence of sepsis.  - Monitor intermittently.  Tyrone Nine, MD [facility] [email or web address] 01/19/2023, 10:15 AM

## 2023-01-19 NOTE — Progress Notes (Signed)
       Overnight   NAME: Quiyana Vaquerano MRN: [id number] DOB : 1961-07-07    Date of Service   01/19/2023   HPI/Events of Note    Notified by RN over concern for QTc and Code status notation of DNR in notation and Full code on chart summary. Discussed with Night Attending  In Admitting notation : Dr Thornell Mule 01/18/23 @2041  hrs  "Code Status: DNR  01/18/2023----15 minute conversation - After a prolonged conversation the legal guardian, [name], understanding her risk of prolonged QTc said that she was would not want chest compressions or to be intubated but was okay with shocks if it was needed.  She thought that her quality of life currently was not sufficient for CPR or intubation after talking about the risks and benefits of this.  I said this was reasonable and I would document this in the chart.  I answered all questions, legal guarding completely agree with the plan expressed understanding. Family Communication: [name] the legal guadian Disposition Plan: psychiatric unit"     Interventions/ Plan   Code status DNR Please see Admitting notation       Chinita Greenland BSN MSNA MSN ACNPC-AG Acute Care Nurse Practitioner [facility] [place]

## 2023-01-19 NOTE — Progress Notes (Signed)
This CSW will now remove pt from the [facility] shift report due to pt being medically admitted and the [facility] Psychiatry Consult Team is currently following pt.  [name], MSW, [facility] 01/19/2023 9:42 PM

## 2023-01-19 NOTE — Progress Notes (Signed)
Mobility Specialist - Progress Note   01/19/23 1450  Mobility  Activity Ambulated independently in hallway  Level of Assistance Independent  Assistive Device None  Distance Ambulated (ft) 700 ft  Range of Motion/Exercises Active  Activity Response Tolerated well  Mobility Referral Yes  $Mobility charge 1 Mobility  Mobility Specialist Start Time (ACUTE ONLY) 1430  Mobility Specialist Stop Time (ACUTE ONLY) 1450  Mobility Specialist Time Calculation (min) (ACUTE ONLY) 20 min   Pt was found in bed and agreeable to ambulate. Once getting up from bed found some tubing in her pocket. Notified RN after session. At EOS was left in bed with sitter in room.  Billey Chang Mobility Specialist

## 2023-01-19 NOTE — Progress Notes (Signed)
Daily Progress Note   Patient Name: [name]       Date: 01/19/2023 DOB: [birth year]  Age: 62 y.o. MRN#: [id number] Attending Physician: Tyrone Nine, MD Primary Care Physician: [facility] Admit Date: 01/08/2023  Reason for Consultation/Follow-up: Establishing goals of care  Patient Profile/HPI: 62 y.o. female  with past medical history of paranoid schizophrenia admitted on 01/08/2023 due to psychotic episode and IVC. She is not eating or drinking due to feeling the food is poisonous. She is refusing medications. Inpatient psych treatment has been recommended. Palliative consulted for goals of care.   Subjective: Chart reviewed.  Patient ate 100% of her breakfast and lunch today and took her morning medications. She is awake and alert. Denies pain.  She responded to me haltingly, did not engage in conversation.   Discussed with Dr. Gasper Sells- difficult case d/t patient would benefit from ECT and has in the past- but had ventricular tachycardia. She has prolonged QT which puts her at risk for arhythmia r/t antipsychotic treatment. There is concern if she does not have ECT or take antipsychotic, then she would need hospice.  She has lost 3 kg since psych admission in January- she is 37.3kg down from 40. Not quite 10 percent in the last six months.   Physical Exam Vitals and nursing note reviewed.  Constitutional:      Appearance: She is ill-appearing.     Comments: Thin  Neurological:     Mental Status: She is alert.             Vital Signs: BP 120/71 (BP Location: Right Arm)   Pulse (!) 53   Temp 98.3 F (36.8 C) (Oral)   Resp 18   Ht 5\' 4"  (1.626 m)   Wt 37.3 kg   SpO2 96%   BMI 14.12 kg/m  SpO2: SpO2: 96 % O2 Device: O2 Device: Room Air O2 Flow Rate:     Intake/output summary:  Intake/Output Summary (Last 24 hours) at 01/19/2023 1500 Last data filed at 01/19/2023 0800 Gross per 24 hour  Intake 1165.64 ml  Output --  Net 1165.64 ml   LBM: Last BM Date : 01/13/23 Baseline Weight: Weight: 40.4 kg Most recent weight: Weight: 37.3 kg     Patient Active Problem List   Diagnosis Date Noted   Schizoaffective disorder,  depressive type (HCC) 04/08/2022   Ventricular tachycardia (HCC) 10/13/2021   Sinus bradycardia 10/03/2021   Anemia of chronic disease 10/02/2021   Hypomagnesemia 09/22/2021   Hypokalemia 09/22/2021   Hypophosphatemia 09/21/2021   Hyponatremia 09/20/2021   Metabolic acidosis 09/16/2021   Protein-calorie malnutrition, severe (HCC) 03/27/2020   Hypotension 03/26/2020   Anorexia 03/26/2020   Hypokalemia, hypomagnesemia and hypophosphatemia 03/26/2020   Hypocalcemia 03/26/2020   Fall at home, initial encounter 03/26/2020   Recurrent hypoglycemia and severe malnutrition 03/14/2020   Paranoid schizophrenia (HCC) 03/01/2020   Noncompliance with diet and medication regimen    Psychoses (HCC)    Tobacco abuse 03/12/2014   Hypokalemic alkalosis 11/11/2011   Medically noncompliant 11/11/2011    Palliative Care Assessment & Plan    Assessment/Recommendations/Plan  Appears to be improving today- eating meals, taking medications Dr. Gasper Sells requested family meeting- I called [name] and left message requesting return call I'm not certain she would qualify for hospice at this point- she is eating, and notes indicate that prior to admission- she was eating, but would only eat prepackaged food   Code Status: DNR  Prognosis:  Unable to determine  Discharge Planning: To Be Determined  Care plan was discussed with Dr. Gasper Sells  Thank you for allowing the Palliative Medicine Team to assist in the care of this patient.  Total time:  60 minutes  Greater than 50%  of this time was spent counseling and coordinating  care related to the above assessment and plan.  Ocie Bob, AGNP-C Palliative Medicine   Please contact Palliative Medicine Team phone at [phone] for questions and concerns.

## 2023-01-19 NOTE — Consult Note (Signed)
[name] Psychiatry Consult Evaluation  Service Date: January 19, 2023 LOS:  LOS: 0 days    Primary Psychiatric Diagnoses  Paranoid schizophrenia   Assessment  [name] is a 62 y.o. female admitted medically for 01/08/2023  5:01 PM for failure to thrive caused by refusing to eat. She carries the psychiatric diagnoses of paranoid schizophrenia and has a past medical history of  anemia. Psychiatry was consulted for continuity of care by Dr. Jarvis Newcomer. Notably, she has been in the ED since 01/08/2023.   This is a very compex case requiring a high level of medical decision making; I have attempted to summarize the main issues here. I am very familiar with pt and legal guardian from multiple prior hospitalizations for the same issue (see family meeting 10/02/2021). Briefly, this is a pt with a long course of paranoid schizophrenia including prior episodes of catatonia who is currently admitted for failure to thrive. She had been in the ED for almost 2 weeks prior to being admitted medically to the hospital; during that time she refused all PO meds except for a couple of potassium packets. I have reviewed all ED notes, and there is no documentation of any voluntary oral intake. She has been refusing most labwork as well. She has been denied by multiple inpt psychiatric units due to not being medically stable. She was made DNR AM of 7/2 after a new EKG showed Qtc in 560s which her legal guardian agreed with; prolonged qtc greatly complicates care as it puts her at high risk of TdP; she has also had significant electrolyte fluctuations due to not eating. She had an episode of what was described as wide-complex tachcycardia during index ECT series in March of 2023 and it has not been attempted since. Notably in documentation from around that time, ECT was not continued because her condition had improved, not because it was felt to be too high risk to attempt again (see dc summary from Dr. Earnest Rosier 10/18/2022). This is  in contrast to sister/legal guardian's belief that they "almost lost pt" with ECT in the past. ECT has not been attempted since. She has had marginal improvements on antipsychotics in the past - usually improving to the point that she is able to eat or drink but has residual psychotic symptoms.  She has most frequently been on haldol (including decanoate)l; all known prior antipsychotic trials are below.   On initial assessment by the consult team, pt was adversarial and minimally participative.  She remained paranoid and delusional. She lacked capacity to make most decisions. In the past, she has required restraints for NG tube placement (and then continually to keep it in). I am hesitant to start that at this point given uncertainty about whether we will be able to get her effective treatment for her underlying psychotic disorder.   This pt has limited treatment options due to prolonged qtc and a questionable history of cardiac complications during ECT. The best option for treating her psychosis (which has led to significant morbidity) is ECT if that is possible, however her sister (legal guardian) is understandably hesitant given prior tachycardia during procedure. The next best option would be clozapine, although I am hesitant to offer that because of the need for frequent blood draws and now neutropenia from malnutrition. Other antipsychotics (most likely haldol or olanzapine) are an option, although in the past they have only been partially effective. She has not trialled depakote, which would not treat underlying psychosis but may help agitation.  Ultimately I think what is best for this pt would be transfer to ECT capable facility as a medical floor pt and/or transfer to [facility] medical floor; I appreciate significant barriers to this outcome.   It is very important to document how much this pt is actually eating -  apparently has been eating on and off per sitter.   Diagnoses:  Active Hospital  problems: Principal Problem:   Paranoid schizophrenia (HCC) Active Problems:   Hypokalemia     Plan   ## Psychiatric Medication Recommendations:  -- None currently, need to have meeting with sister [name] and hear back from ethics committee -- clearly meets criteria for forced meds.   Would want daily EKGs with forced meds if selecting antipsychotics  ## Medical Decision Making Capacity:  - Has legal guardian  ## Further Work-up:  -- Please consult cardiology to speak to the following   - risk of TdP with antipsychotic administration  - review of prior unsuccessful ECT course (notes dating from March 2023) to see if this treatment could potentially be restarted   -- ethics consult re: futility of care if too high risk for antipsychotics and ECT   -- most recent EKG on 7/2 had QtC of 562 --> 512 on repeat  -- Pertinent labwork reviewed earlier this admission includes: frequent hypokalemia, occasionally critical.   ## Disposition:  -- Unclear; range of options listed below  - Transfer to [facility] medical floor if sister is open to further ECT (need cardiac eval first)  - Transfer to [facility]  - discharge home  with hospice (pt was reportedly eating more at home - would need to have extensive family meeting before considering this)   ## Behavioral / Environmental:  --  Utilize compassion and acknowledge the patient's experiences while setting clear and realistic expectations for care. -- please document daily Is and Os; has been eating some.    ## Safety and Observation Level:  - Based on my clinical evaluation, I estimate the patient to be at low risk of intentional self harm in the current setting - At this time, we recommend a 1:1 level of observation as she is under IVC.   Thank you for this consult request. Recommendations have been communicated to the primary team.  We will continue to follow at this time.   Jenesis Martin A Alysandra Lobue  Psychiatric and Social History    Relevant Aspects of Hospital Course:  Admitted on  7/1 for FTT after a prolonged ED course in which she frequently refused medications and food.  Patient Report:  Pt seen with teaching team (Dr. Sindy Messing and MS-III).  Pt is seen lying in bed. She is malodorous, disheveled, and cachectic. She answers most questions semi-appropriately, but makes it clear she does not desire psychaitric consultation. She knows where she is (the hospital) and why she is here (they took me here). She does not believe her sister/mother are related to her and is angry that they are responsible for her coming to the hospital. She speaks mostly clearly and coherently, but occasionally uses neologisms. She is not willing to engage in discussion of how much she is eating.  She does not believe she has a mental illness. She is not willing to engage in physical exam. She repeatedly asks that this author leave.   Psych ROS:  Did not answer most questions about current sx. Clearly experiencing delusions and paranoia  Collateral information:  Spoke to sister in AM to outline treatment options and update on medical  hospitalization.  7 min call.  Spoke to ethics and palliative through the day.   Psychiatric History:  Information collected from medical record  Prev Dx/Sx: Schizophrenia Current Psych Provider: Does not follow up outpt voluntarily.  Home Meds (current): none Previous Med Trials: haldol (incl decanoate) risperidone, paliperidone (incl invega sustenna), quetiapine, olanzapine, flupheanzine (incl dec). Has also been on lithium in the past. This list is non-exhaustive and generated by epic's search functionClozapine has been suggested on multiple occasions but she has not gotten consistent labs.   Therapy: no  Prior ECT: yes, march 2023 Prior Psych Hospitalization: numerous incl 2 year stay at [facility]  Prior Self Harm: unknown Prior Violence: yes, has been physically aggressive when psychotic  Social History:   Living Situation: With 106+ year old mother   Substance History Tobacco use: yes  Exam Findings   Psychiatric Specialty Exam:   Presentation  General Appearance: -- (Emaciated, lying in bed, minimal spontaneous movement.)  Eye Contact:Poor  Speech:Clear and Coherent (Almost a musical prosody. When agitated devolves into word salad)  Speech Volume:Increased  Handedness:Right   Mood and Affect  Mood:-- ("I want to go home")  Affect:Flat (Minimal change in facial expression even when clearly agitated.)   Thought Process  Thought Processes:-- (Grossly coherent within her own (paranoid) belief system.)  Descriptions of Associations:Intact  Orientation:-- (Oriented to self, situation, and month. Didn't know date, but knew that Independence Day was near.)  Thought Content:Paranoid Ideation; Delusions (Specifically capgras delusions, delusion that she is not emaciated, persecutory delusions)  Hallucinations:Hallucinations: -- (Denied. Not overtly RIS during my exam.)  Ideas of Reference:Paranoia; Delusions  Suicidal Thoughts:Suicidal Thoughts: No (However, behaviors will lead to death.)  Homicidal Thoughts:Homicidal Thoughts: No   Sensorium  Memory:Immediate Fair; Recent Fair  Judgment:Impaired  Insight:Lacking   Executive Functions  Concentration:Fair  Attention Span:-- (Did not participate in formal attention testing.)  Recall:Fair  Fund of Knowledge:Fair  Language:Fair (Overall good. Occasionally used neologisms (schizo, schizzy) or words incorrectly ie "buck up" instead of "bulk up")   Psychomotor Activity  Psychomotor Activity:No data recorded  Assets  Assets:Communication Skills; Social Support (existence of legal guardian)   Sleep  Sleep:Sleep: -- (did not assess)    Physical Exam: Vital signs:  Temp:  [98.2 F (36.8 C)-98.5 F (36.9 C)] 98.5 F (36.9 C) (07/02 0816) Pulse Rate:  [44-59] 54 (07/02 0816) Resp:  [14-18] 18 (07/02  0816) BP: (105-134)/(59-77) 105/59 (07/02 0816) SpO2:  [94 %-100 %] 100 % (07/02 0816) Weight:  [37.3 kg] 37.3 kg (07/02 0500) Physical Exam Constitutional:      Comments: Cachetic   HENT:     Mouth/Throat:     Mouth: Mucous membranes are dry.  Pulmonary:     Effort: Pulmonary effort is normal.  Skin:    Comments: No rash on visible skin     Blood pressure (!) 105/59, pulse (!) 54, temperature 98.5 F (36.9 C), temperature source Oral, resp. rate 18, height 5\' 4"  (1.626 m), weight 37.3 kg, SpO2 100 %. Body mass index is 14.12 kg/m.   Other History   These have been pulled in through the EMR, reviewed, and updated if appropriate.   Family History:  The patient's family history is not on file.  Medical History: Past Medical History:  Diagnosis Date   Anemia 10/02/2021   Non compliance w medication regimen    Schizophrenia [facility])     Surgical History: Past Surgical History:  Procedure Laterality Date   BIOPSY  09/25/2021   Procedure:  BIOPSY;  Surgeon: Rachael Fee, MD;  Location: [facility];  Service: Gastroenterology;;   ESOPHAGOGASTRODUODENOSCOPY (EGD) WITH PROPOFOL [name] 09/25/2021   Procedure: ESOPHAGOGASTRODUODENOSCOPY (EGD) WITH PROPOFOL;  Surgeon: Rachael Fee, MD;  Location: [facility];  Service: Gastroenterology;  Laterality: [name];  With NGT placement    Medications:   Current Facility-Administered Medications:    acetaminophen (TYLENOL) tablet 650 mg, 650 mg, Oral, Q6H PRN **OR** acetaminophen (TYLENOL) suppository 650 mg, 650 mg, Rectal, Q6H PRN, Thornell Mule, Austin, DO   enoxaparin (LOVENOX) injection 30 mg, 30 mg, Subcutaneous, Q24H, Skakle, Austin, DO, 30 mg at 01/18/23 2322   LORazepam (ATIVAN) tablet 1 mg, 1 mg, Oral, Q8H PRN **OR** LORazepam (ATIVAN) injection 2 mg, 2 mg, Intramuscular, Q8H PRN, Motley-Mangrum, Jadeka A, PMHNP   multivitamin with minerals tablet 1 tablet, 1 tablet, Oral, Daily, Skakle, Austin, DO   polyethylene glycol (MIRALAX /  GLYCOLAX) packet 17 g, 17 g, Oral, Daily PRN, Charlane Ferretti, DO   potassium chloride (KLOR-CON) packet 20 mEq, 20 mEq, Oral, BID, Randel Pigg, Dorma Russell, MD, 20 mEq at 01/19/23 0010   potassium chloride SA (KLOR-CON M) CR tablet 40 mEq, 40 mEq, Oral, Once, Jacalyn Lefevre, MD   thiamine (VITAMIN B1) tablet 100 mg, 100 mg, Oral, Daily, Charlane Ferretti, DO  Allergies: Allergies  Allergen Reactions   Aspirin Hives, Nausea Only and Other (See Comments)    "Possibly caused hives," per sister and caused "STOMACH UPSET"   Ziprasidone Hcl Hives, Rash and Other (See Comments)    GEODON = "Caused twitching and redness of eyes" (also)

## 2023-01-20 DIAGNOSIS — F17210 Nicotine dependence, cigarettes, uncomplicated: Secondary | ICD-10-CM | POA: Diagnosis present

## 2023-01-20 DIAGNOSIS — E872 Acidosis, unspecified: Secondary | ICD-10-CM | POA: Diagnosis not present

## 2023-01-20 DIAGNOSIS — D72819 Decreased white blood cell count, unspecified: Secondary | ICD-10-CM | POA: Diagnosis not present

## 2023-01-20 DIAGNOSIS — Z681 Body mass index (BMI) 19 or less, adult: Secondary | ICD-10-CM | POA: Diagnosis not present

## 2023-01-20 DIAGNOSIS — Z886 Allergy status to analgesic agent status: Secondary | ICD-10-CM | POA: Diagnosis not present

## 2023-01-20 DIAGNOSIS — Z888 Allergy status to other drugs, medicaments and biological substances status: Secondary | ICD-10-CM | POA: Diagnosis not present

## 2023-01-20 DIAGNOSIS — F200 Paranoid schizophrenia: Secondary | ICD-10-CM | POA: Diagnosis not present

## 2023-01-20 DIAGNOSIS — Z66 Do not resuscitate: Secondary | ICD-10-CM | POA: Diagnosis not present

## 2023-01-20 DIAGNOSIS — Z91148 Patient's other noncompliance with medication regimen for other reason: Secondary | ICD-10-CM | POA: Diagnosis not present

## 2023-01-20 DIAGNOSIS — F29 Unspecified psychosis not due to a substance or known physiological condition: Secondary | ICD-10-CM | POA: Diagnosis present

## 2023-01-20 DIAGNOSIS — Z1152 Encounter for screening for COVID-19: Secondary | ICD-10-CM | POA: Diagnosis not present

## 2023-01-20 DIAGNOSIS — Z79899 Other long term (current) drug therapy: Secondary | ICD-10-CM | POA: Diagnosis not present

## 2023-01-20 DIAGNOSIS — R627 Adult failure to thrive: Secondary | ICD-10-CM | POA: Diagnosis not present

## 2023-01-20 DIAGNOSIS — Z751 Person awaiting admission to adequate facility elsewhere: Secondary | ICD-10-CM | POA: Diagnosis not present

## 2023-01-20 DIAGNOSIS — I959 Hypotension, unspecified: Secondary | ICD-10-CM | POA: Diagnosis not present

## 2023-01-20 DIAGNOSIS — Z5329 Procedure and treatment not carried out because of patient's decision for other reasons: Secondary | ICD-10-CM | POA: Diagnosis not present

## 2023-01-20 DIAGNOSIS — F061 Catatonic disorder due to known physiological condition: Secondary | ICD-10-CM | POA: Diagnosis not present

## 2023-01-20 DIAGNOSIS — R001 Bradycardia, unspecified: Secondary | ICD-10-CM | POA: Diagnosis not present

## 2023-01-20 DIAGNOSIS — R64 Cachexia: Secondary | ICD-10-CM | POA: Diagnosis not present

## 2023-01-20 DIAGNOSIS — E876 Hypokalemia: Secondary | ICD-10-CM | POA: Diagnosis not present

## 2023-01-20 DIAGNOSIS — N179 Acute kidney failure, unspecified: Secondary | ICD-10-CM | POA: Diagnosis not present

## 2023-01-20 LAB — POTASSIUM: Potassium: 4 mmol/L (ref 3.5–5.1)

## 2023-01-20 LAB — MAGNESIUM: Magnesium: 2.2 mg/dL (ref 1.7–2.4)

## 2023-01-20 MED ORDER — VALPROATE SODIUM 100 MG/ML IV SOLN
250.0000 mg | Freq: Three times a day (TID) | INTRAVENOUS | Status: DC
  Administered 2023-01-20 – 2023-01-21 (×3): 250 mg via INTRAVENOUS
  Filled 2023-01-20 (×2): qty 2.5
  Filled 2023-01-20: qty 250
  Filled 2023-01-20: qty 2.5
  Filled 2023-01-20 (×2): qty 250

## 2023-01-20 NOTE — Progress Notes (Signed)
Mobility Specialist - Progress Note    01/20/23 1017  Mobility  Activity Ambulated independently in hallway  Level of Assistance Independent  Assistive Device None  Distance Ambulated (ft) 1400 ft  Range of Motion/Exercises Active  Activity Response Tolerated well  Mobility Referral Yes  $Mobility charge 1 Mobility  Mobility Specialist Start Time (ACUTE ONLY) 1003  Mobility Specialist Stop Time (ACUTE ONLY) 1017  Mobility Specialist Time Calculation (min) (ACUTE ONLY) 14 min   Pt was found in bed and agreeable to ambulate. Had no complaints during session and at EOS returned to bed with all needs met. Sitter in room.  Billey Chang Mobility Specialist

## 2023-01-20 NOTE — TOC Initial Note (Addendum)
Transition of Care [facility]) - Initial/Assessment Note    Patient Details  Name: [name] MRN: [id number] Date of Birth: [birth year]  Transition of [facility]) CM/SW Contact:    Larrie Kass, LCSW Phone Number: 01/20/2023, 10:54 AM  Clinical Narrative:                 Per chart review pt,was admitted for observation , Per Margaretha Seeds MD pt meets IP criteria CSW followed up with [facility], Pt is still being review. Pt is under IVC and will expire on 01/22/23. TOC to follow   Expected Discharge Plan: Psychiatric Hospital Barriers to Discharge: Continued Medical Work up   Patient Goals and CMS Choice            Expected Discharge Plan and Services       Living arrangements for the past 2 months: Single Family Home                                      Prior Living Arrangements/Services Living arrangements for the past 2 months: Single Family Home Lives with:: Self Patient language and need for interpreter reviewed:: Yes        Need for Family Participation in Patient Care: Yes (Comment) Care giver support system in place?: Yes (comment)   Criminal Activity/Legal Involvement Pertinent to Current Situation/Hospitalization: No - Comment as needed  Activities of Daily Living Home Assistive Devices/Equipment: None ADL Screening (condition at time of admission) Patient's cognitive ability adequate to safely complete daily activities?: Yes Is the patient deaf or have difficulty hearing?: No Does the patient have difficulty seeing, even when wearing glasses/contacts?: No Does the patient have difficulty concentrating, remembering, or making decisions?: No Patient able to express need for assistance with ADLs?: Yes Does the patient have difficulty dressing or bathing?: No Independently performs ADLs?: Yes (appropriate for developmental age) Does the patient have difficulty walking or climbing stairs?: No Weakness of Legs: None Weakness of  Arms/Hands: None  Permission Sought/Granted                  Emotional Assessment       Orientation: : Oriented to Self, Oriented to Place, Oriented to  Time, Oriented to Situation   Psych Involvement: Yes (comment)  Admission diagnosis:  Hypokalemia [E87.6] AKI (acute kidney injury) (HCC) [N17.9] QT prolongation [R94.31] Psychosis, unspecified psychosis type (HCC) [F29] Patient Active Problem List   Diagnosis Date Noted   Schizoaffective disorder, depressive type (HCC) 04/08/2022   Ventricular tachycardia (HCC) 10/13/2021   Sinus bradycardia 10/03/2021   Anemia of chronic disease 10/02/2021   Hypomagnesemia 09/22/2021   Hypokalemia 09/22/2021   Hypophosphatemia 09/21/2021   Hyponatremia 09/20/2021   Metabolic acidosis 09/16/2021   Protein-calorie malnutrition, severe (HCC) 03/27/2020   Hypotension 03/26/2020   Anorexia 03/26/2020   Hypokalemia, hypomagnesemia and hypophosphatemia 03/26/2020   Hypocalcemia 03/26/2020   Fall at home, initial encounter 03/26/2020   Recurrent hypoglycemia and severe malnutrition 03/14/2020   Paranoid schizophrenia (HCC) 03/01/2020   Noncompliance with diet and medication regimen    Psychoses (HCC)    Tobacco abuse 03/12/2014   Hypokalemic alkalosis 11/11/2011   Medically noncompliant 11/11/2011   PCP:  [facility]:   [facility] [address] [place] [address] Phone: [phone] Fax: [phone]     Social Determinants of Health (SDOH) Social History: SDOH Screenings  Food Insecurity: No Food Insecurity (01/19/2023)  Housing: Low Risk  (01/19/2023)  Transportation Needs: No Transportation Needs (01/19/2023)  Utilities: Not At Risk (01/19/2023)  Alcohol Screen: Low Risk  (04/08/2022)  Tobacco Use: High Risk (01/19/2023)   SDOH Interventions:     Readmission Risk Interventions     No data to display

## 2023-01-20 NOTE — Progress Notes (Signed)
[facility] PROGRESS NOTE  [name] (DOB: 06-06-1961) QIO:[id number] PCP: [facility]  Brief Narrative: [name] is a 62 y.o. female with a history of paranoid schizophrenia, schizoaffective, bipolar disorder, malnutrition (BMI 14) who presented to the ED on 01/08/2023 for evaluation of acute psychosis.  She has been boarding in the ED undergoing psychiatric therapy awaiting inpatient psychiatry placement, but has not been eating or drinking so admission to the hospitalist service was requested given haldol on 6/30 but is not regularly administered any antipsychotics due to QT prolongation.  ECT has been considered, but felt to be high risk given report of wide complex tachycardia with this in the past.   Subjective:  " I have already seen 2 doctors today you are wearing me out" [name] confirms she has had lunch and has been calm  Objective: BP (!) 91/56 (BP Location: Right Arm)   Pulse (!) 58   Temp 99.1 F (37.3 C) (Oral)   Resp 18   Ht 5\' 4"  (1.626 m)   Wt 38.4 kg   SpO2 99%   BMI 14.53 kg/m   Awake, coherent Allows me to listen to lung sounds and heart S1-S2 no murmur Deferred rest of exam  Assessment & Plan: Psychosis, paranoid schizophrenia:  - Appreciate psychiatry evaluation and management, d/w Dr. Gasper Sells this morning. Tough position with her history of WCT during ECT previously and current prolonged QT interval.  -Ativan 1 mg every 8 as needed p.o., if agitated may use Ativan 2 mg IM every 8 as needed Tolerating valproic acid to 50 every 8 - Awaiting placement to [facility] per notes.  Prolonged QT interval, sinus bradycardia: TSH 0.396. - QTc to 506. -Cardiology suggest holding Haldol Zyprexa Seroquel , and do not know for sure if there would be recurrence of wide-complex tachycardia they recommend holding any types of ECT and have signed off  Hypokalemia: Resolved with supplementation. - Keep level >4, continues on Klor-Con 20 twice  daily  Profound malnutrition BMI 14 -Calorie count in process and await input although suspect this will show poor p.o. -She did eat some chicken soup for lunch and they has ordered dinner so maybe this will improve  Leukopenia: With normal differential, no evidence of sepsis.  - Monitor intermittently.  Rhetta Mura, MD [facility] [email or web address] 01/20/2023, 6:04 PM

## 2023-01-20 NOTE — Care Management Obs Status (Signed)
MEDICARE OBSERVATION STATUS NOTIFICATION   Patient Details  Name: [name] MRN: [id number] Date of Birth: 1961-01-05   Medicare Observation Status Notification Given:  Yes    Larrie Kass, LCSW 01/20/2023, 10:42 AM

## 2023-01-20 NOTE — Consult Note (Signed)
[name] Consult Evaluation  Service Date: January 20, 2023 LOS:  LOS: 0 days    Primary Psychiatric Diagnoses  Paranoid schizophrenia   Assessment  [name] is a 62 y.o. female admitted medically for 01/08/2023  5:01 PM for failure to thrive caused by refusing to eat. She carries the psychiatric diagnoses of paranoid schizophrenia and has a past medical history of  anemia. Psychiatry was consulted for continuity of care by Dr. Jarvis Newcomer. Notably, she has been in the ED since 01/08/2023.   This is a very compex case requiring a high level of medical decision making; I have attempted to summarize the main issues here. I am very familiar with pt and legal guardian from multiple prior hospitalizations for the same issue (see family meeting 10/02/2021). Briefly, this is a pt with a long course of paranoid schizophrenia including prior episodes of catatonia who is currently admitted for failure to thrive. She had been in the ED for almost 2 weeks prior to being admitted medically to the hospital; during that time she refused all PO meds except for a couple of potassium packets. I have reviewed all ED notes, and there is no documentation of any voluntary oral intake. She has been refusing most labwork as well. She has been denied by multiple inpt psychiatric units due to not being medically stable. She was made DNR AM of 7/2 after a new EKG showed Qtc in 560s which her legal guardian agreed with; prolonged qtc greatly complicates care as it puts her at high risk of TdP; she has also had significant electrolyte fluctuations due to not eating. She had an episode of what was described as wide-complex tachcycardia during index ECT series in March of 2023 and it has not been attempted since. Notably in documentation from around that time, ECT was not continued because her condition had improved, not because it was felt to be too high risk to attempt again (see dc summary from Dr. Earnest Rosier 10/18/2022). This is  in contrast to sister/legal guardian's belief that they "almost lost pt" with ECT in the past. ECT has not been attempted since. She has had marginal improvements on antipsychotics in the past - usually improving to the point that she is able to eat or drink but has residual psychotic symptoms.  She has most frequently been on haldol (including decanoate)l; all known prior antipsychotic trials are below.   On initial assessment by the consult team, pt was adversarial and minimally participative.  She remained paranoid and delusional. She lacked capacity to make most decisions. In the past, she has required restraints for NG tube placement (and then continually to keep it in). I am hesitant to start that at this point given uncertainty about whether we will be able to get her effective treatment for her underlying psychotic disorder.   This pt has limited treatment options due to prolonged qtc and a questionable history of cardiac complications during ECT. The best option for treating her psychosis (which has led to significant morbidity) is ECT if that is possible, however her sister (legal guardian) is understandably hesitant given prior tachycardia during procedure. The next best option would be clozapine, although I am hesitant to offer that because of the need for frequent blood draws and now neutropenia from malnutrition. Other antipsychotics (most likely haldol or olanzapine) are an option, although in the past they have only been partially effective. She has not trialled depakote, which would not treat underlying psychosis but may help agitation.  Ultimately I think what is best for this pt would be transfer to ECT capable facility as a medical floor pt and/or transfer to [facility] medical floor; I appreciate significant barriers to this outcome.   It is very important to document how much this pt is actually eating -  apparently has been eating on and off per sitter.   IVC expires 7/5 (Friday) and  will need to be renewed at that time.  7/3 - took depakote w/o forced med order; spoke to ethics who recommends ongoing maximal care  Diagnoses:  Active Hospital problems: Principal Problem:   Paranoid schizophrenia (HCC) Active Problems:   Hypokalemia   Psychosis (HCC)     Plan   ## Psychiatric Medication Recommendations:  -- None currently, need to have meeting with sister [name] and hear back from ethics committee  -- clearly meets criteria for forced meds, however do not believe benefits > risks at this time in this pt who is maintaining IV access, not agitated, eating (if only a little bit) and not currently requiring restraints (has required use of restraints for prior meds over objection) -- I reviewed chart in late afternoon and she took her depakote without forced med order in place.   --  Would want daily EKGs with forced meds if selecting antipsychotics  ## Medical Decision Making Capacity:  - Has legal guardian  ## Further Work-up:  -- consider re consulting cardiology if qtc improves   -- ethics consult re: futility of care if too high risk for antipsychotics and ECT   -- most recent EKG on 7/2 had QtC of 562 --> 512 on repeat  -- Pertinent labwork reviewed earlier this admission includes: frequent hypokalemia, occasionally critical.   ## Disposition:  -- Unclear; range of options listed below  - Transfer to [facility] medical floor if sister is open to further ECT (need cardiac eval first)  - Transfer to [facility]  - discharge home  with ?hospice (pt was reportedly eating more at home - would need to have extensive family meeting before considering this)  (this is a last resort)   ## Behavioral / Environmental:  --  Utilize compassion and acknowledge the patient's experiences while setting clear and realistic expectations for care. -- please document daily Is and Os; has been eating some.    ## Safety and Observation Level:  - Based on my clinical evaluation,  I estimate the patient to be at low risk of intentional self harm in the current setting - At this time, we recommend a 1:1 level of observation as she is under IVC.   Thank you for this consult request. Recommendations have been communicated to the primary team.  We will continue to follow at this time.   Persia Lintner A Dejanae Helser  Psychiatric and Social History   Relevant Aspects of Hospital Course:  Admitted on  7/1 for FTT after a prolonged ED course in which she frequently refused medications and food.  Patient Report:  Machell seen this AM. Remains malodorous, disheveled, and cachectic with a sing-song quality to her voice. She remains generally oriented. She ate her oatmeal today (verified by sitter). She is not willing to engage in discussion about her beliefs around food. She refuses physical exam, stating that she needs to be able to go home to put on muscle. She repeatedly accuses this Chartered loss adjuster of not being rational, etc, and ultimately terminates the interview.    Psych ROS:  Did not answer most questions about current sx. Clearly experiencing  delusions and paranoia  Collateral information:  Surveyor, mining (sister) x1 with no response. Received call back where we discussed depakote initiation, discussing that it is directed at agitation more so than psychosis. Updated on plan of care. She agrees in holding off on forced med order as Andreanna historically becomes much more compliant when inpt.   Psychiatric History:  Information collected from medical record  Prev Dx/Sx: Schizophrenia Current Psych Provider: Does not follow up outpt voluntarily.  Home Meds (current): none Previous Med Trials: haldol (incl decanoate) risperidone, paliperidone (incl invega sustenna), quetiapine, olanzapine, flupheanzine (incl dec). Has also been on lithium in the past. This list is non-exhaustive and generated by epic's search functionClozapine has been suggested on multiple occasions but she has not gotten  consistent labs.   Therapy: no  Prior ECT: yes, march 2023 Prior Psych Hospitalization: numerous incl 2 year stay at [facility]  Prior Self Harm: unknown Prior Violence: yes, has been physically aggressive when psychotic  Social History:  Living Situation: With 51+ year old mother   Substance History Tobacco use: yes  Exam Findings   Psychiatric Specialty Exam:   Presentation  General Appearance: -- (emaciated, lying in bed, not looking at examiner)  Eye Contact:Poor  Speech:Clear and Coherent (musical prosody)  Speech Volume:Normal  Handedness:Right   Mood and Affect  Mood:-- (I'm good)  Affect:Flat   Thought Process  Thought Processes:-- (grossly coherent within her delusional constructs)  Descriptions of Associations:Intact  Orientation:-- (self/partial)  Thought Content:Paranoid Ideation; Delusions  Hallucinations:Hallucinations: -- (denied not RIS)  Ideas of Reference:Paranoia; Delusions  Suicidal Thoughts:Suicidal Thoughts: No  Homicidal Thoughts:Homicidal Thoughts: No   Sensorium  Memory:Immediate Fair; Recent Fair; Remote Fair  Judgment:Impaired  Insight:Lacking   Executive Functions  Concentration:Fair  Attention Span:-- (Did not participate in formal testing)  Recall:Fair  Fund of Knowledge:Fair  Language:Fair   Psychomotor Activity  Psychomotor Activity:Psychomotor Activity: Normal   Assets  Assets:Communication Skills; Social Support   Sleep  Sleep:Sleep: -- (did not assess)    Physical Exam: Vital signs:  Temp:  [98.3 F (36.8 C)-99.1 F (37.3 C)] 99.1 F (37.3 C) (07/03 1435) Pulse Rate:  [57-59] 58 (07/03 1435) Resp:  [16-18] 18 (07/03 1435) BP: (91-102)/(56-68) 91/56 (07/03 1435) SpO2:  [98 %-100 %] 99 % (07/03 1435) Weight:  [37.3 kg-38.4 kg] 38.4 kg (07/03 0500) Physical Exam Constitutional:      Comments: Cachetic   HENT:     Mouth/Throat:     Mouth: Mucous membranes are dry.  Pulmonary:      Effort: Pulmonary effort is normal.  Skin:    Comments: No rash on visible skin   She did not allow further physical examination.   Blood pressure (!) 91/56, pulse (!) 58, temperature 99.1 F (37.3 C), temperature source Oral, resp. rate 18, height 5\' 4"  (1.626 m), weight 38.4 kg, SpO2 99 %. Body mass index is 14.53 kg/m.   Other History   These have been pulled in through the EMR, reviewed, and updated if appropriate.   Family History:  The patient's family history is not on file.  Medical History: Past Medical History:  Diagnosis Date   Anemia 10/02/2021   Non compliance w medication regimen    Schizophrenia [facility])     Surgical History: Past Surgical History:  Procedure Laterality Date   BIOPSY  09/25/2021   Procedure: BIOPSY;  Surgeon: Rachael Fee, MD;  Location: [facility] ENDOSCOPY;  Service: Gastroenterology;;   ESOPHAGOGASTRODUODENOSCOPY (EGD) WITH PROPOFOL [name] 09/25/2021   Procedure: ESOPHAGOGASTRODUODENOSCOPY (  EGD) WITH PROPOFOL;  Surgeon: Rachael Fee, MD;  Location: [facility];  Service: Gastroenterology;  Laterality: [name];  With NGT placement    Medications:   Current Facility-Administered Medications:    acetaminophen (TYLENOL) tablet 650 mg, 650 mg, Oral, Q6H PRN **OR** acetaminophen (TYLENOL) suppository 650 mg, 650 mg, Rectal, Q6H PRN, Thornell Mule, Austin, DO   enoxaparin (LOVENOX) injection 30 mg, 30 mg, Subcutaneous, Q24H, Skakle, Austin, DO, 30 mg at 01/19/23 2153   LORazepam (ATIVAN) tablet 1 mg, 1 mg, Oral, Q8H PRN **OR** LORazepam (ATIVAN) injection 2 mg, 2 mg, Intramuscular, Q8H PRN, Motley-Mangrum, Jadeka A, PMHNP   multivitamin with minerals tablet 1 tablet, 1 tablet, Oral, Daily, Charlane Ferretti, DO, 1 tablet at 01/20/23 1057   polyethylene glycol (MIRALAX / GLYCOLAX) packet 17 g, 17 g, Oral, Daily PRN, Charlane Ferretti, DO   potassium chloride (KLOR-CON) packet 20 mEq, 20 mEq, Oral, BID, Randel Pigg, Dorma Russell, MD, 20 mEq at 01/20/23 1057   potassium chloride SA  (KLOR-CON M) CR tablet 40 mEq, 40 mEq, Oral, Once, Jacalyn Lefevre, MD   thiamine (VITAMIN B1) tablet 100 mg, 100 mg, Oral, Daily, Skakle, Austin, DO, 100 mg at 01/20/23 1057   valproate (DEPACON) 250 mg in dextrose 5 % 50 mL IVPB, 250 mg, Intravenous, Q8H, Terryn Rosenkranz A, Last Rate: 52.5 mL/hr at 01/20/23 1505, 250 mg at 01/20/23 1505  Allergies: Allergies  Allergen Reactions   Aspirin Hives, Nausea Only and Other (See Comments)    "Possibly caused hives," per sister and caused "STOMACH UPSET"   Ziprasidone Hcl Hives, Rash and Other (See Comments)    GEODON = "Caused twitching and redness of eyes" (also)

## 2023-01-21 DIAGNOSIS — E876 Hypokalemia: Secondary | ICD-10-CM | POA: Diagnosis not present

## 2023-01-21 LAB — BASIC METABOLIC PANEL
Anion gap: 8 (ref 5–15)
BUN: 15 mg/dL (ref 8–23)
CO2: 29 mmol/L (ref 22–32)
Calcium: 8.2 mg/dL — ABNORMAL LOW (ref 8.9–10.3)
Chloride: 99 mmol/L (ref 98–111)
Creatinine, Ser: 0.58 mg/dL (ref 0.44–1.00)
GFR, Estimated: >60 mL/min (ref >60)
Glucose, Bld: 101 mg/dL — ABNORMAL HIGH (ref 70–99)
Potassium: 3.4 mmol/L — ABNORMAL LOW (ref 3.5–5.1)
Sodium: 136 mmol/L (ref 135–145)

## 2023-01-21 LAB — MAGNESIUM: Magnesium: 2.2 mg/dL (ref 1.7–2.4)

## 2023-01-21 MED ORDER — POTASSIUM CHLORIDE 20 MEQ PO PACK
40.0000 meq | PACK | Freq: Two times a day (BID) | ORAL | Status: DC
  Administered 2023-01-21 – 2023-01-22 (×2): 40 meq via ORAL
  Filled 2023-01-21 (×2): qty 2

## 2023-01-21 MED ORDER — DIVALPROEX SODIUM 250 MG PO DR TAB
250.0000 mg | DELAYED_RELEASE_TABLET | Freq: Three times a day (TID) | ORAL | Status: DC
  Administered 2023-01-21 – 2023-01-22 (×4): 250 mg via ORAL
  Filled 2023-01-21 (×4): qty 1

## 2023-01-21 MED ORDER — SODIUM CHLORIDE 0.9 % IV BOLUS
250.0000 mL | Freq: Once | INTRAVENOUS | Status: AC
  Administered 2023-01-21: 250 mL via INTRAVENOUS

## 2023-01-21 NOTE — TOC Progression Note (Signed)
Transition of Care [facility]) - Progression Note    Patient Details  Name: [name] MRN: [id number] Date of Birth: [birth year]  [facility] CM/SW Contact  Adrian Prows, RN Phone Number: 01/21/2023, 9:40 AM  Clinical Narrative:    [facility] at [facility] Intake [phone]); no bed available; TOC will call daily for bed availability.   Expected Discharge Plan: Psychiatric Hospital Barriers to Discharge: Continued Medical Work up  Expected Discharge Plan and Services       Living arrangements for the past 2 months: Single Family Home                                       Social Determinants of Health (SDOH) Interventions SDOH Screenings   Food Insecurity: No Food Insecurity (01/19/2023)  Housing: Low Risk  (01/19/2023)  Transportation Needs: No Transportation Needs (01/19/2023)  Utilities: Not At Risk (01/19/2023)  Alcohol Screen: Low Risk  (04/08/2022)  Tobacco Use: High Risk (01/19/2023)    Readmission Risk Interventions     No data to display

## 2023-01-21 NOTE — Consult Note (Addendum)
[name] Psychiatry Consult Evaluation  Service Date: January 21, 2023 LOS:  LOS: 1 day    Primary Psychiatric Diagnoses  Paranoid schizophrenia   Assessment  [name] is a 62 y.o. female admitted medically for 01/08/2023  5:01 PM for failure to thrive caused by refusing to eat. She carries the psychiatric diagnoses of paranoid schizophrenia and has a past medical history of  anemia. Psychiatry was consulted for continuity of care by Dr. Jarvis Newcomer. Notably, she has been in the ED since 01/08/2023.   This is a very compex case requiring a high level of medical decision making; I have attempted to summarize the main issues here. I am very familiar with pt and legal guardian from multiple prior hospitalizations for the same issue (see family meeting 10/02/2021). Briefly, this is a pt with a long course of paranoid schizophrenia including prior episodes of catatonia who is currently admitted for failure to thrive. She had been in the ED for almost 2 weeks prior to being admitted medically to the hospital; during that time she refused all PO meds except for a couple of potassium packets. I have reviewed all ED notes, and there is no documentation of any voluntary oral intake. She has been refusing most labwork as well. She has been denied by multiple inpt psychiatric units due to not being medically stable. She was made DNR AM of 7/2 after a new EKG showed Qtc in 560s which her legal guardian agreed with; prolonged qtc greatly complicates care as it puts her at high risk of TdP; she has also had significant electrolyte fluctuations due to not eating. She had an episode of what was described as wide-complex tachcycardia during index ECT series in March of 2023 and it has not been attempted since. Notably in documentation from around that time, ECT was not continued because her condition had improved, not because it was felt to be too high risk to attempt again (see dc summary from Dr. Earnest Rosier 10/18/2022). This is  in contrast to sister/legal guardian's belief that they "almost lost pt" with ECT in the past. ECT has not been attempted since. She has had marginal improvements on antipsychotics in the past - usually improving to the point that she is able to eat or drink but has residual psychotic symptoms.  She has most frequently been on haldol (including decanoate)l; all known prior antipsychotic trials are below.   On today's assessment she was very cooperative and showed some willingness to engage. She did allow for a physical exam, no rigidity, DTRs, or clonus noted. She conversed about the holiday, and the fireworks would not disturb her. She admits to eating her meals and recalls what she had for breakfast and dinner. She declined bringing in outside saying " the hospital got it right. Its ok. I been eating. " Patient remains eager to go home, discussed calling her sister and she states "she is not kin to me. " She denies any hallucinations or delusions at this time. She denies any si/hi.   The patient is showing signs of improvement, and her eating habits have become more consistent, though her nutritional intake may still be below ideal levels. Importantly, she is less paranoid and psychotic than before. I am hopeful that, pending a discussion with her guardian, we may be able to consider discharging her to her home.  This pt has limited treatment options due to prolonged qtc and a questionable history of cardiac complications during ECT. The best option for treating her  psychosis (which has led to significant morbidity) is ECT if that is possible, however her sister (legal guardian) is understandably hesitant given prior tachycardia during procedure.   Patient is eating her meals, and this is confirmed by sitter.   It is very important to document how much this pt is actually eating -  apparently has been eating on and off per sitter.   IVC expires 7/5 (Friday) and will need to be renewed at that  time.  7/3 - took depakote w/o forced med order; spoke to ethics who recommends ongoing maximal care  Diagnoses:  Active Hospital problems: Principal Problem:   Hypokalemia Active Problems:   Paranoid schizophrenia (HCC)   Psychosis (HCC)     Plan   ## Psychiatric Medication Recommendations:  -- None currently, need to have meeting with sister [name] and hear back from ethics committee  -- clearly meets criteria for forced meds, however do not believe benefits > risks at this time in this pt who is maintaining IV access, not agitated, eating (if only a little bit) and not currently requiring restraints (has required use of restraints for prior meds over objection)   Would want daily EKGs with forced meds if selecting antipsychotics  ## Medical Decision Making Capacity:  - Has legal guardian  ## Further Work-up:  -- consider re consulting cardiology if qtc improves   -- ethics consult re: futility of care if too high risk for antipsychotics and ECT   -- most recent EKG on 7/2 had QtC of 562 --> 512 on repeat  -- Pertinent labwork reviewed earlier this admission includes: frequent hypokalemia, occasionally critical.   ## Disposition:  -- Unclear; range of options listed below  - Transfer to [facility] medical floor if sister is open to further ECT (need cardiac eval first)  - Transfer to [facility]  -discharge home with frequent monitoring by sister/lg.   - discharge home  with ?hospice (pt was reportedly eating more at home - would need to have extensive family meeting before considering this)  (this is a last resort)   ## Behavioral / Environmental:  --  Utilize compassion and acknowledge the patient's experiences while setting clear and realistic expectations for care. -- please document daily Is and Os; has been eating some.    ## Safety and Observation Level:  - Based on my clinical evaluation, I estimate the patient to be at low risk of intentional self harm in the  current setting - At this time, we recommend a 1:1 level of observation as she is under IVC.   Thank you for this consult request. Recommendations have been communicated to the primary team.  We will continue to follow at this time.   Paula Amos, FNP  Psychiatric and Social History   Relevant Aspects of Hospital Course:  Admitted on  7/1 for FTT after a prolonged ED course in which she frequently refused medications and food.  Patient Report:  [name] seen this AM. She is appears to be groomed, no malodorous smell noted.  She remains generally oriented. She ate her oatmeal and drank her coffee today (verified by sitter). She tells me she ate soup and sandwich for dinner. She is conversant and engages well.    Psych ROS:  Did not answer most questions about current sx. Clearly experiencing delusions and paranoia  Collateral information:  [employer], who is open to discharging patient as long as she continues to eat and does not become threatening when people enter the home.  She has considered placement at a facility, however patient will not sign for VA benefits. She is working on getting her service connection benefits and she is over the income for Medicaid. She is requesting assistance with a letter for the [place].   Psychiatric History:  Information collected from medical record  Prev Dx/Sx: Schizophrenia Current Psych Provider: Does not follow up outpt voluntarily.  Home Meds (current): none Previous Med Trials: haldol (incl decanoate) risperidone, paliperidone (incl invega sustenna), quetiapine, olanzapine, flupheanzine (incl dec). Has also been on lithium in the past. This list is non-exhaustive and generated by epic's search functionClozapine has been suggested on multiple occasions but she has not gotten consistent labs.   Therapy: no  Prior ECT: yes, march 2023 Prior Psych Hospitalization: numerous incl 2 year stay at [facility]  Prior Self Harm: unknown Prior Violence:  yes, has been physically aggressive when psychotic  Social History:  Living Situation: With 41+ year old mother   Substance History Tobacco use: yes  Exam Findings   Psychiatric Specialty Exam:   Presentation  General Appearance: -- (emaciated, lying in bed, not looking at examiner)  Eye Contact:Poor  Speech:Clear and Coherent (musical prosody)  Speech Volume:Normal  Handedness:Right   Mood and Affect  Mood:-- (I'm good)  Affect:Flat   Thought Process  Thought Processes:-- (grossly coherent within her delusional constructs)  Descriptions of Associations:Intact  Orientation:-- (self/partial)  Thought Content:Paranoid Ideation; Delusions  Hallucinations:Hallucinations: -- (denied not RIS)  Ideas of Reference:Paranoia; Delusions  Suicidal Thoughts:Suicidal Thoughts: No  Homicidal Thoughts:Homicidal Thoughts: No   Sensorium  Memory:Immediate Fair; Recent Fair; Remote Fair  Judgment:Impaired  Insight:Lacking   Executive Functions  Concentration:Fair  Attention Span:-- (Did not participate in formal testing)  Recall:Fair  Fund of Knowledge:Fair  Language:Fair   Psychomotor Activity  Psychomotor Activity:Psychomotor Activity: Normal   Assets  Assets:Communication Skills; Social Support   Sleep  Sleep:Sleep: -- (did not assess)    Physical Exam: Vital signs:  Temp:  [97.4 F (36.3 C)-99.1 F (37.3 C)] 97.4 F (36.3 C) (07/04 0546) Pulse Rate:  [54-59] 54 (07/04 0546) Resp:  [16-18] 16 (07/04 0546) BP: (91-99)/(56-60) 99/59 (07/04 0546) SpO2:  [99 %-100 %] 99 % (07/04 0546) Physical Exam Vitals and nursing note reviewed.  Constitutional:      General: She is awake.     Appearance: She is cachectic.     Comments: Cachetic   HENT:     Mouth/Throat:     Mouth: Mucous membranes are dry.  Pulmonary:     Effort: Pulmonary effort is normal.  Skin:    Comments: No rash on visible skin  Neurological:     General: No focal  deficit present.     Mental Status: She is alert and oriented to person, place, and time. Mental status is at baseline.  Psychiatric:        Attention and Perception: Perception normal. She is inattentive.        Mood and Affect: Mood is anxious.        Speech: Speech normal.        Behavior: Behavior is cooperative.        Thought Content: Thought content normal.        Cognition and Memory: Cognition and memory normal.        Judgment: Judgment normal.   She did not allow further physical examination.   Blood pressure (!) 99/59, pulse (!) 54, temperature (!) 97.4 F (36.3 C), temperature source Oral, resp. rate 16, height 5'  4" (1.626 m), weight 38.4 kg, SpO2 99 %. Body mass index is 14.53 kg/m.   Other History   These have been pulled in through the EMR, reviewed, and updated if appropriate.   Family History:  The patient's family history is not on file.  Medical History: Past Medical History:  Diagnosis Date   Anemia 10/02/2021   Non compliance w medication regimen    Schizophrenia [facility])     Surgical History: Past Surgical History:  Procedure Laterality Date   BIOPSY  09/25/2021   Procedure: BIOPSY;  Surgeon: Rachael Fee, MD;  Location: [facility];  Service: Gastroenterology;;   ESOPHAGOGASTRODUODENOSCOPY (EGD) WITH PROPOFOL [name] 09/25/2021   Procedure: ESOPHAGOGASTRODUODENOSCOPY (EGD) WITH PROPOFOL;  Surgeon: Rachael Fee, MD;  Location: [facility];  Service: Gastroenterology;  Laterality: [name];  With NGT placement    Medications:   Current Facility-Administered Medications:    acetaminophen (TYLENOL) tablet 650 mg, 650 mg, Oral, Q6H PRN **OR** acetaminophen (TYLENOL) suppository 650 mg, 650 mg, Rectal, Q6H PRN, Thornell Mule, Austin, DO   enoxaparin (LOVENOX) injection 30 mg, 30 mg, Subcutaneous, Q24H, Skakle, Austin, DO, 30 mg at 01/20/23 2232   LORazepam (ATIVAN) tablet 1 mg, 1 mg, Oral, Q8H PRN **OR** LORazepam (ATIVAN) injection 2 mg, 2 mg, Intramuscular, Q8H  PRN, Motley-Mangrum, Jadeka A, PMHNP   multivitamin with minerals tablet 1 tablet, 1 tablet, Oral, Daily, Charlane Ferretti, DO, 1 tablet at 01/21/23 0857   polyethylene glycol (MIRALAX / GLYCOLAX) packet 17 g, 17 g, Oral, Daily PRN, Charlane Ferretti, DO   potassium chloride (KLOR-CON) packet 40 mEq, 40 mEq, Oral, BID, Samtani, Jai-Gurmukh, MD   potassium chloride SA (KLOR-CON M) CR tablet 40 mEq, 40 mEq, Oral, Once, Jacalyn Lefevre, MD   thiamine (VITAMIN B1) tablet 100 mg, 100 mg, Oral, Daily, Skakle, Austin, DO, 100 mg at 01/21/23 0857   valproate (DEPACON) 250 mg in dextrose 5 % 50 mL IVPB, 250 mg, Intravenous, Q8H, [name], Margaret A, Last Rate: 52.5 mL/hr at 01/21/23 0900, 250 mg at 01/21/23 0900  Allergies: Allergies  Allergen Reactions   Aspirin Hives, Nausea Only and Other (See Comments)    "Possibly caused hives," per sister and caused "STOMACH UPSET"   Ziprasidone Hcl Hives, Rash and Other (See Comments)    GEODON = "Caused twitching and redness of eyes" (also)

## 2023-01-21 NOTE — Progress Notes (Signed)
   01/21/23 2115  Assess: MEWS Score  Temp (!) 97.5 F (36.4 C)  BP (!) 80/55  MAP (mmHg) (!) 64  Pulse Rate (!) 55  Resp 15  SpO2 99 %  O2 Device Room Air  Assess: MEWS Score  MEWS Temp 0  MEWS Systolic 2  MEWS Pulse 0  MEWS RR 0  MEWS LOC 0  MEWS Score 2  MEWS Score Color Yellow  Assess: if the MEWS score is Yellow or Red  Were vital signs taken at a resting state? Yes  Focused Assessment No change from prior assessment  Does the patient meet 2 or more of the SIRS criteria? No  MEWS guidelines implemented  Yes, yellow  Treat  MEWS Interventions Considered administering scheduled or prn medications/treatments as ordered  Take Vital Signs  Increase Vital Sign Frequency  Yellow: Q2hr x1, continue Q4hrs until patient remains green for 12hrs  Escalate  MEWS: Escalate Yellow: Discuss with charge nurse and consider notifying provider and/or RRT  Notify: Charge Nurse/RN  Name of Charge Nurse/RN Notified Lauren, RN  Provider Notification  Provider Name/Title Anthoney Harada NP  Date Provider Notified 01/21/23  Time Provider Notified 2119  Method of Notification  (secure chat)  Notification Reason Other (Comment) (low blood pressure)  Provider response Other (Comment)  Date of Provider Response 01/21/23  Time of Provider Response 2122  Assess: SIRS CRITERIA  SIRS Temperature  0  SIRS Pulse 0  SIRS Respirations  0  SIRS WBC 0  SIRS Score Sum  0

## 2023-01-21 NOTE — Ethics Note (Addendum)
Ethics Consult Order placed and call received from Psychiatry Dr. Gasper Sells for primary ethics consult.  History:  61 yr old female well known to Psychiatry service with multiple previous admissions. (See H&P) Patient with history of paranoid schizophrenia. Admitted for exacerbation of symptoms, bradycardia with prolonged QT interval and malnutrition.  IVC for this admission. Sister is legal guardian.  She is agitated at times but non-violent. Daily weights and calorie count, BMI of 14. She has been off of her psychiatric medications due to a prolonged QT interval.  Cardiology has been consulted.  (See note)    Previously treated for the malnutrition with NG requiring continuous restraints.  ECT in the past with reported complication of wide-complex tachycardia.  Legal guardian wishes not to re-visit ECT for treatment because of the previous complications.  Treatment options include forcing medications, forced nutrition, possible ECT or no treatment with palliative care for end of life.  Unfortunately without medical treatment the patient will continue to decline with a morbid prognosis.  The ethical principle of choosing better vs best treatment, in light of the prognosis and goals of care by LG, if no treatment implemented is recommended and was discussed with the consulting Provider.   Per Consulting Provider the LG would like to pursue medical treatment and move toward stabilization of her psychiatric exacerbation.  Ethically this would be a better choice for her based on the goals expressed by the LG.  This recommendation is at the discretion of the providers in consultation with the LG, attending and consultants based on the patients current medical condition and the goals of care.  Palliative Medicine has been consulted as well as cardiology for follow-up evaluation.  Ethics team will be available if needed to attend family/Legal Guardian meeting if desired.  Thank you for allowing the  Ethics team to consult on this very complex case.  Please contact if further assistance is desired.  Cindra Presume, MSN, RN-BC, [facility], [facility] Ethics Consult Provider [facility] [phone]

## 2023-01-22 ENCOUNTER — Encounter (HOSPITAL_COMMUNITY): Payer: Self-pay | Admitting: Family Medicine

## 2023-01-22 ENCOUNTER — Other Ambulatory Visit: Payer: Self-pay

## 2023-01-22 ENCOUNTER — Other Ambulatory Visit (HOSPITAL_COMMUNITY): Payer: Self-pay

## 2023-01-22 DIAGNOSIS — E876 Hypokalemia: Secondary | ICD-10-CM | POA: Diagnosis not present

## 2023-01-22 LAB — ACTH STIMULATION, 3 TIME POINTS
Cortisol, 30 Min: 30.9 ug/dL
Cortisol, 60 Min: 34.9 ug/dL
Cortisol, Base: 14.9 ug/dL

## 2023-01-22 MED ORDER — SODIUM CHLORIDE 0.9 % IV SOLN: INTRAVENOUS | Status: AC

## 2023-01-22 MED ORDER — SODIUM CHLORIDE 0.9 % IV BOLUS
500.0000 mL | Freq: Once | INTRAVENOUS | Status: AC
  Administered 2023-01-22: 500 mL via INTRAVENOUS

## 2023-01-22 MED ORDER — POTASSIUM CHLORIDE CRYS ER 20 MEQ PO TBCR
40.0000 meq | EXTENDED_RELEASE_TABLET | Freq: Every day | ORAL | 0 refills | Status: DC
  Filled 2023-01-22: qty 7, 3d supply, fill #0

## 2023-01-22 MED ORDER — COSYNTROPIN 0.25 MG IJ SOLR
0.2500 mg | Freq: Once | INTRAMUSCULAR | Status: AC
  Administered 2023-01-22: 0.25 mg via INTRAVENOUS
  Filled 2023-01-22: qty 0.25

## 2023-01-22 MED ORDER — DIVALPROEX SODIUM 125 MG PO CSDR
250.0000 mg | DELAYED_RELEASE_CAPSULE | Freq: Three times a day (TID) | ORAL | 2 refills | Status: DC
  Filled 2023-01-22: qty 26, 5d supply, fill #0
  Filled 2023-01-22: qty 64, 10d supply, fill #0

## 2023-01-22 MED ORDER — MIDODRINE HCL 5 MG PO TABS: 5.0000 mg | ORAL_TABLET | Freq: Three times a day (TID) | ORAL | Status: DC

## 2023-01-22 MED ORDER — MIDODRINE HCL 5 MG PO TABS
5.0000 mg | ORAL_TABLET | Freq: Three times a day (TID) | ORAL | 3 refills | Status: DC
  Filled 2023-01-22: qty 90, 30d supply, fill #0

## 2023-01-22 NOTE — TOC Progression Note (Signed)
Transition of Care [facility]) - Progression Note    Patient Details  Name: Shecid Wassmann MRN: [id number] Date of Birth: 18-Jun-1961  Transition of [facility]) CM/SW Contact  Adrian Prows, RN Phone Number: 01/22/2023, 3:08 PM  Clinical Narrative:    Sherron Monday w/ pt's legal guardian Latory Deloera [phone]); discussed obtaining PCP for pt; she says pt is "stubborn, won't get in my car, and non-compliant"; offered to give list of [facility] PCPs and [facility] for her to make PCP appt; she declined; also offered to make [facility] appt pt and she declines; spoke w/ pt in room to discuss PCP appt; she replied "hell no"; Dr Mahala Menghini notified via secure chat.   Expected Discharge Plan: Psychiatric Hospital Barriers to Discharge: Continued Medical Work up  Expected Discharge Plan and Services       Living arrangements for the past 2 months: Single Family Home Expected Discharge Date: 01/22/23                                     Social Determinants of Health (SDOH) Interventions SDOH Screenings   Food Insecurity: No Food Insecurity (01/19/2023)  Housing: Low Risk  (01/19/2023)  Transportation Needs: No Transportation Needs (01/19/2023)  Utilities: Not At Risk (01/19/2023)  Alcohol Screen: Low Risk  (04/08/2022)  Tobacco Use: High Risk (01/22/2023)    Readmission Risk Interventions     No data to display

## 2023-01-22 NOTE — Progress Notes (Signed)
IV bolus given. Tried to recheck blood pressure after but patient refused. She's becoming irritable and agitated when I tried to explain that it has to be checked. Provider made aware.

## 2023-01-22 NOTE — Progress Notes (Addendum)
[facility] PROGRESS NOTE  Late entry for 7/4  [name] (DOB: Apr 05, 1961) BJY:[id number] PCP: [facility]  Brief Narrative: [name] is a 62 y.o. female with a history of paranoid schizophrenia, schizoaffective, bipolar disorder, malnutrition (BMI 14) who presented to the ED on 01/08/2023 for evaluation of acute psychosis.  She has been boarding in the ED undergoing psychiatric therapy awaiting inpatient psychiatry placement, but has not been eating or drinking so admission to the hospitalist service was requested given haldol on 6/30 but is not regularly administered any antipsychotics due to QT prolongation.  ECT has been considered, but felt to be high risk given report of wide complex tachycardia with this in the past.   Subjective:  Eating and drinking--confirmed at bedside with Nt who is sitting with her Doesn't seem to be responding to any internal stimuli No pain fever Seems happy to have had some soup--  Objective: BP (!) 84/55 (BP Location: Left Arm)   Pulse (!) 43   Temp 98.3 F (36.8 C) (Oral)   Resp 16   Ht 5\' 4"  (1.626 m)   Wt 38.4 kg   SpO2 98%   BMI 14.53 kg/m   poor eye contact, but allows this examiner to see her this am and do a circumscribed exam.  Awake, coherent Allows me to listen to lung sounds and heart S1-S2 no murmur Abdomen soft nt nd no rebound no gaurd  Assessment & Plan: Psychosis, paranoid schizophrenia:  - Appreciate psychiatry evaluation and management, d/w Dr. Gasper Sells this morning. Tough position with her history of WCT during ECT previously and current prolonged QT interval.  -note Ethics consultation from this am--appreciate their input-- Unclear [other than ECT] that patient has durable medical [medication] fix for her condition--I do think that psychiatric services should re-visit discussion with family or PROXY about risks/benefits as well as alternatives to ECT, which has durably helped her in the past and I will  communicate this to Psychiatry. Agree that Hospice /palliative care needs to be involved in patient's care moving forward--will likely consult She had episodic VTACH on hospitalization @ [facility] prior--very high threshold to use this mechanism to control her Psychiatric issues per Cardiology who saw her this admit -Ativan 1 mg every 8 as needed p.o., if agitated may use Ativan 2 mg IM every 8 as needed Tolerating valproic acid 250 every 8--currently taking in PO - maybe home tomorrow if eating--needs close OP monitoring  Prolonged QT interval, sinus bradycardia: TSH 0.396. - QTc to 506. -Cardiology suggest holding Haldol Zyprexa Seroquel , unclear at this time recurrence risk of wide-complex tachycardia - they recommend ECT and have signed off--this may need revisit.  Hypokalemia: Resolved with supplementation. - Keep level >4, continues on Klor-Con 40 bid.  Mag is 2.2  Profound malnutrition BMI 14 -Calorie count in process and await input although suspect this will show poor p.o. -await results calorie count  Leukopenia: With normal differential, no evidence of sepsis.  - Monitor intermittently.  Secondary to malnutrition likely  Rhetta Mura, MD [facility] [email or web address] 01/22/2023, 4:19 AM

## 2023-01-22 NOTE — Consult Note (Addendum)
[name] Psychiatry Consult Evaluation  Service Date: January 22, 2023 LOS:  LOS: 2 days    Primary Psychiatric Diagnoses  Paranoid schizophrenia   Assessment  [name] is a 62 y.o. female admitted medically for 01/08/2023  5:01 PM for failure to thrive caused by refusing to eat. She carries the psychiatric diagnoses of paranoid schizophrenia and has a past medical history of  anemia. Psychiatry was consulted for continuity of care by Dr. Jarvis Newcomer. Notably, she has been in the ED since 01/08/2023.   This is a very compex case requiring a high level of medical decision making; I have attempted to summarize the main issues here. I am very familiar with pt and legal guardian from multiple prior hospitalizations for the same issue (see family meeting 10/02/2021). Briefly, this is a pt with a long course of paranoid schizophrenia including prior episodes of catatonia who is currently admitted for failure to thrive. She had been in the ED for almost 2 weeks prior to being admitted medically to the hospital; during that time she refused all PO meds except for a couple of potassium packets. I have reviewed all ED notes, and there is no documentation of any voluntary oral intake. She has been refusing most labwork as well. She has been denied by multiple inpt psychiatric units due to not being medically stable. She was made DNR AM of 7/2 after a new EKG showed Qtc in 560s which her legal guardian agreed with; prolonged qtc greatly complicates care as it puts her at high risk of TdP; she has also had significant electrolyte fluctuations due to not eating. She had an episode of what was described as wide-complex tachcycardia during index ECT series in March of 2023 and it has not been attempted since. Notably in documentation from around that time, ECT was not continued because her condition had improved, not because it was felt to be too high risk to attempt again (see dc summary from Dr. Earnest Rosier 10/18/2022). This is in  contrast to sister/legal guardian's belief that they "almost lost pt" with ECT in the past. ECT has not been attempted since. She has had marginal improvements on antipsychotics in the past - usually improving to the point that she is able to eat or drink but has residual psychotic symptoms.  She has most frequently been on haldol (including decanoate)l; all known prior antipsychotic trials are below.   01/22/2023: On todays assessment she continues to be willingly participating and answering questions. She remains cooperative and engages well with this provider. Although she is speaking in monotone, there is no delay in speech. She has not required prn antipsychotics at this time. It should be noted that patients mood and appetite have improved without the use of antipsychotics or forced medications. She did eat 100% of her breakfast and meals. While she did not agree to the need of medication, she does seem to appreciate the use of Depakote as it has improved her mood, reduced psychosis to some degree, and contributes to her stabilizing well enough to discharge home.  Patient remains eager to go home, and promises she will continue to eat. She endorses having adequate food intake at home to get her covered through the weekend.  She denies any hallucinations or delusions at this time. She denies any si/hi.   The patient is showing signs of improvement, and her eating habits have become more consistent, though her nutritional intake may still be below ideal levels. Importantly, she is less paranoid and psychotic  than before.   This pt has limited treatment options due to prolonged qtc and a questionable history of cardiac complications during ECT. The best option for treating her psychosis (which has led to significant morbidity) is ECT if that is possible, however her sister (legal guardian) is understandably hesitant given prior tachycardia during procedure.    Diagnoses:  Active Hospital  problems: Principal Problem:   Hypokalemia Active Problems:   Paranoid schizophrenia (HCC)   Psychosis (HCC)     Plan   ## Psychiatric Medication Recommendations:  Continue Depakote 250mg  po q8hr, consider switching to sprinkles for transition home.   ## Medical Decision Making Capacity:  - Has legal guardian  ## Further Work-up:  -- consider re consulting cardiology if qtc improves   -- ethics consult re: futility of care if too high risk for antipsychotics and ECT. See note from ethics committee  -- most recent EKG on 7/2 had QtC of 512 on repeat  -- Pertinent labwork reviewed earlier this admission includes: frequent hypokalemia, occasionally critical.   ## Disposition:  -- Unclear; range of options listed below.   - IF patient returns for FTT, transfer to ECT capable floor immediately pending cardiac evaluation and refer to ethics note this admission for further advise.    - Begin [facility] referral day 1; very high medical complexity and will benefit from hight level of care.      ## Behavioral / Environmental:  --  Utilize compassion and acknowledge the patient's experiences while setting clear and realistic expectations for care. -- please document daily Is and Os; has been eating some.    ## Safety and Observation Level:  - Based on my clinical evaluation, I estimate the patient to be at low risk of intentional self harm in the current setting - At this time, we recommend a 1:1 level of observation and IVC will expire today. She no longer meets criteria for renewal at this time.   I personally spent 60 minutes on the unit in direct patient care. The direct patient care time included face-to-face time with the patient, reviewing the patient's chart, communicating with other professionals, and coordinating care. Greater than 50% of this time was spent in counseling or coordinating care with the patient regarding goals of hospitalization, psycho-education, and discharge  planning needs.  Thank you for this consult request. Recommendations have been communicated to the primary team.  We will sign off at this time.   Maryagnes Amos, FNP  Psychiatric and Social History   Relevant Aspects of Hospital Course:  Admitted on  7/1 for FTT after a prolonged ED course in which she frequently refused medications and food.  Patient Report:  [name] seen this AM, lying in bed. She remains generally oriented. She ate her breakfast 100% and drank her coffee today (verified by sitter). She tells me she has food at home for the weekend. She is conversant and engages well.    Psych ROS:  Did not answer most questions about current sx. Clearly experiencing delusions and paranoia  Collateral information:  [employer], who is open to discharging patient as long as she continues to eat and does not become threatening when people enter the home. She has considered placement at a facility, however patient will not sign for VA benefits. She is working on getting her service connection benefits and she is over the income for Medicaid. She is requesting assistance with a letter for the [place].   Psychiatric History:  Information collected from medical  record  Prev Dx/Sx: Schizophrenia Current Psych Provider: Does not follow up outpt voluntarily.  Home Meds (current): none Previous Med Trials: haldol (incl decanoate) risperidone, paliperidone (incl invega sustenna), quetiapine, olanzapine, flupheanzine (incl dec). Has also been on lithium in the past. This list is non-exhaustive and generated by epic's search functionClozapine has been suggested on multiple occasions but she has not gotten consistent labs.   Therapy: no  Prior ECT: yes, march 2023 Prior Psych Hospitalization: numerous incl 2 year stay at [facility]  Prior Self Harm: unknown Prior Violence: yes, has been physically aggressive when psychotic  Social History:  Living Situation: Lives alone, mother is providing care  and organizing meals.    Substance History Tobacco use: yes  Exam Findings   Psychiatric Specialty Exam:   Presentation  General Appearance: Appropriate for Environment; Casual  Eye Contact:Minimal  Speech:Clear and Coherent; Normal Rate  Speech Volume:Normal  Handedness:Right   Mood and Affect  Mood:Depressed  Affect:Flat   Thought Process  Thought Processes:Coherent; Linear  Descriptions of Associations:Intact  Orientation:Full (Time, Place and Person)  Thought Content:Logical  Hallucinations:Hallucinations: None  Ideas of Reference:None  Suicidal Thoughts:Suicidal Thoughts: No  Homicidal Thoughts:Homicidal Thoughts: No   Sensorium  Memory:Immediate Fair; Recent Fair; Remote Fair  Judgment:Poor  Insight:Poor   Executive Functions  Concentration:Fair  Attention Span:Fair  Recall:Fair  Fund of Knowledge:Fair  Language:Fair   Psychomotor Activity  Psychomotor Activity:Psychomotor Activity: Normal   Assets  Assets:Communication Skills; Social Support   Sleep  Sleep:Sleep: Fair    Physical Exam: Vital signs:  Temp:  [97.4 F (36.3 C)-98.3 F (36.8 C)] 97.4 F (36.3 C) (07/05 0707) Pulse Rate:  [43-56] 56 (07/05 0707) Resp:  [14-16] 14 (07/05 0707) BP: (80-90)/(50-64) 90/64 (07/05 0707) SpO2:  [93 %-100 %] 100 % (07/05 0707) Weight:  [40.9 kg] 40.9 kg (07/05 0500) Physical Exam Vitals and nursing note reviewed.  Constitutional:      General: She is awake.     Appearance: She is cachectic.     Comments: Cachetic   HENT:     Mouth/Throat:     Mouth: Mucous membranes are dry.  Pulmonary:     Effort: Pulmonary effort is normal.  Skin:    Comments: No rash on visible skin  Neurological:     General: No focal deficit present.     Mental Status: She is alert and oriented to person, place, and time. Mental status is at baseline.  Psychiatric:        Attention and Perception: Perception normal. She is inattentive.         Mood and Affect: Mood is anxious.        Speech: Speech normal.        Behavior: Behavior is cooperative.        Thought Content: Thought content normal.        Cognition and Memory: Cognition and memory normal.        Judgment: Judgment normal.   She did not allow further physical examination.   Blood pressure 90/64, pulse (!) 56, temperature (!) 97.4 F (36.3 C), temperature source Oral, resp. rate 14, height 5\' 4"  (1.626 m), weight 40.9 kg, SpO2 100 %. Body mass index is 15.48 kg/m.   Other History   These have been pulled in through the EMR, reviewed, and updated if appropriate.   Family History:  The patient's family history is not on file.  Medical History: Past Medical History:  Diagnosis Date   Anemia 10/02/2021   Non  compliance w medication regimen    Schizophrenia [facility])     Surgical History: Past Surgical History:  Procedure Laterality Date   BIOPSY  09/25/2021   Procedure: BIOPSY;  Surgeon: Rachael Fee, MD;  Location: [facility];  Service: Gastroenterology;;   ESOPHAGOGASTRODUODENOSCOPY (EGD) WITH PROPOFOL [name] 09/25/2021   Procedure: ESOPHAGOGASTRODUODENOSCOPY (EGD) WITH PROPOFOL;  Surgeon: Rachael Fee, MD;  Location: [facility];  Service: Gastroenterology;  Laterality: [name];  With NGT placement    Medications:   Current Facility-Administered Medications:    acetaminophen (TYLENOL) tablet 650 mg, 650 mg, Oral, Q6H PRN **OR** acetaminophen (TYLENOL) suppository 650 mg, 650 mg, Rectal, Q6H PRN, Charlane Ferretti, DO   divalproex (DEPAKOTE) DR tablet 250 mg, 250 mg, Oral, Q8H, Bell, Michelle T, RPH, 250 mg at 01/22/23 0517   enoxaparin (LOVENOX) injection 30 mg, 30 mg, Subcutaneous, Q24H, Skakle, [place], DO, 30 mg at 01/21/23 2148   LORazepam (ATIVAN) tablet 1 mg, 1 mg, Oral, Q8H PRN **OR** LORazepam (ATIVAN) injection 2 mg, 2 mg, Intramuscular, Q8H PRN, Motley-Mangrum, Jadeka A, PMHNP   multivitamin with minerals tablet 1 tablet, 1 tablet, Oral, Daily,  Charlane Ferretti, DO, 1 tablet at 01/22/23 0803   polyethylene glycol (MIRALAX / GLYCOLAX) packet 17 g, 17 g, Oral, Daily PRN, Charlane Ferretti, DO   potassium chloride (KLOR-CON) packet 40 mEq, 40 mEq, Oral, BID, Samtani, Jai-Gurmukh, MD, 40 mEq at 01/22/23 1610   thiamine (VITAMIN B1) tablet 100 mg, 100 mg, Oral, Daily, Charlane Ferretti, DO, 100 mg at 01/22/23 9604  Allergies: Allergies  Allergen Reactions   Aspirin Hives, Nausea Only and Other (See Comments)    "Possibly caused hives," per sister and caused "STOMACH UPSET"   Ziprasidone Hcl Hives, Rash and Other (See Comments)    GEODON = "Caused twitching and redness of eyes" (also)

## 2023-01-22 NOTE — Discharge Summary (Addendum)
Physician Discharge Summary  [name] ZOX:[id number] DOB: 06/08/1961 DOA: 01/08/2023  PCP: [facility]  Admit date: 01/08/2023 Discharge date: 01/22/2023  Time spent: 44 minutes  Recommendations for Outpatient Follow-up:  Needs Bmet and magnesium in 1 week Needs OP palliative care and Psychiatry referral  Discharge Diagnoses:  MAIN problem for hospitalization   Paranoia schizophrenia  Please see below for itemized issues addressed in HOpsital- refer to other progress notes for clarity if needed  Discharge Condition: guarded  Diet recommendation: regular--needs calories  Filed Weights   01/20/23 0035 01/20/23 0500 01/22/23 0500  Weight: 37.3 kg 38.4 kg 40.9 kg    History of present illness:  62 year old black female known for very paranoid schizophrenia + episodic catatonia H/o complex hospitalization 09/08/2021 not taking meds delusions Previously [name] March 2022 and September 2021 She was seen while at [facility] and was felt to lack capacity for a legal guardian [name] consented for treatment interventions-detailed discussions were made because the patient became hypoglycemic at the time D5 hospitalist ethics was consulted patient had an EGD and G-tube placement 09/25/2021 and patient ultimately went to [facility] for electroconvulsive therapy-at [place] [facility] patient underwent several rounds of electroconvulsive therapy culminating in an associated V. tach which was felt to be more secondary to adrenergic surge more so than effect of ECT  She was ultimately readmitted at behavioral health 3/31 through 4/19 when she improved to the point of eating and drinking and was transferred to psychiatry started on Zyprexa at bedtime  This hospitalization started before coming to the ED 01/18/2023 she was hallucinating and agitated at times and spent almost 2 weeks in the ED 6/21 through 7/1 when she was finally found  to be hypokalemic to 2.9 on screening labs  Initially patient was adversarial and minimally participative remain paranoid delusional lacking capacity It was felt that it would be ideal to transfer patient for ECT Patient was admitted to by hospitalist service and was given Haldol on 6/30 but has not been getting several antipsychotics because of QT prolonged patient as long as 550  She was seen by psychiatry throughout hospital stay  Hospital Course:   psychosis paranoid schizophrenia She is not overtly psychotic at this time she is cooperating with diet and she is managing to talk.  psychiatry recommends Depakote sprinkles 250 3 times daily for mood stability assess with her caregiver [name]  can have called this into her pharmacy I do think that her psychiatrist or palliative care team to follow with her as she has an overall poor prognosis  Prolonged QT interval -sinus bradycardia with intermittent hypotension Last QTc was less than 500  cardiology was consulted secondary to psychiatric request for risk benefits of ECT in the setting of prior V. tach-it is a rare whether this would worsen her predilection of Vtach  in the setting of QTc prolongation cardiology signed off   Hypokalemia  mildly hypokalemic only-- will prescribe K-Lor 20 for 3 days and will need labs in a week along with magnesium   Hypotension  resumed midodrine that she had been on previous--this occure occasionally for her and based on prior discharge summary   Discharge Exam: Vitals:   01/22/23 0707 01/22/23 1215  BP: 90/64 91/70  Pulse: (!) 56 63  Resp: 14 14  Temp: (!) 97.4 F (36.3 C) 98.2 F (36.8 C)  SpO2: 100% 100%    Subj on day of d/c    awake coherent refuses  intermittently exam  No ict no pallor no wheeze rales Abd soft scaphoid Neuro intact no focal deficit   Discharge Instructions   Discharge Instructions     Diet - low sodium heart healthy   Complete by: As directed    Discharge  instructions   Complete by: As directed    Make sure you take the depakote 250 3 times a day for mood stability Make sure that you use the midodrine for low blood pressure   Get labs in about 1 week.  Make sure psychiatry sees her additionally as able  She is at high risk with her prolonged qTc for any anti-psychotics and the risk of these outweigh the benefit.  Suggest close follow up with psychaitry   Increase activity slowly   Complete by: As directed       Allergies as of 01/22/2023       Reactions   Aspirin Hives, Nausea Only, Other (See Comments)   "Possibly caused hives," per sister and caused "STOMACH UPSET"   Ziprasidone Hcl Hives, Rash, Other (See Comments)   GEODON = "Caused twitching and redness of eyes" (also)        Medication List     STOP taking these medications    haloperidol 5 MG tablet Commonly known as: HALDOL   lithium carbonate 300 MG capsule   mirtazapine 30 MG tablet Commonly known as: REMERON   multivitamin with minerals Tabs tablet   nicotine 7 mg/24hr patch Commonly known as: NICODERM CQ - dosed in mg/24 hr   OLANZapine 15 MG tablet Commonly known as: ZYPREXA   QUEtiapine 400 MG tablet Commonly known as: SEROQUEL       TAKE these medications    divalproex 125 MG capsule Commonly known as: Depakote Sprinkles Take 2 capsules (250 mg total) by mouth 3 (three) times daily.   midodrine 5 MG tablet Commonly known as: PROAMATINE Take 1 tablet (5 mg total) by mouth 3 (three) times daily with meals.   potassium chloride SA 20 MEQ tablet Commonly known as: KLOR-CON M Take 2 tablets (40 mEq total) by mouth daily.   thiamine 100 MG tablet Commonly known as: VITAMIN B1 Take 1 tablet (100 mg total) by mouth daily.       Allergies  Allergen Reactions   Aspirin Hives, Nausea Only and Other (See Comments)    "Possibly caused hives," per sister and caused "STOMACH UPSET"   Ziprasidone Hcl Hives, Rash and Other (See Comments)     GEODON = "Caused twitching and redness of eyes" (also)      The results of significant diagnostics from this hospitalization (including imaging, microbiology, ancillary and laboratory) are listed below for reference.    Significant Diagnostic Studies: No results found.  Microbiology: Recent Results (from the past 240 hour(s))  SARS Coronavirus 2 by RT PCR (hospital order, performed in [facility] hospital lab) *cepheid single result test* Anterior Nasal Swab     Status: None   Collection Time: 01/18/23 10:00 AM   Specimen: Anterior Nasal Swab  Result Value Ref Range Status   SARS Coronavirus 2 by RT PCR NEGATIVE NEGATIVE Final    Comment: (NOTE) SARS-CoV-2 target nucleic acids are NOT DETECTED.  The SARS-CoV-2 RNA is generally detectable in upper and lower respiratory specimens during the acute phase of infection. The lowest concentration of SARS-CoV-2 viral copies this assay can detect is 250 copies / mL. A negative result does not preclude SARS-CoV-2 infection and should not be used as the sole  basis for treatment or other patient management decisions.  A negative result may occur with improper specimen collection / handling, submission of specimen other than nasopharyngeal swab, presence of viral mutation(s) within the areas targeted by this assay, and inadequate number of viral copies (<250 copies / mL). A negative result must be combined with clinical observations, patient history, and epidemiological information.  Fact Sheet for Patients:   RoadLapTop.co.za  Fact Sheet for Healthcare Providers: http://kim-miller.com/  This test is not yet approved or  cleared by the [place] FDA and has been authorized for detection and/or diagnosis of SARS-CoV-2 by FDA under an Emergency Use Authorization (EUA).  This EUA will remain in effect (meaning this test can be used) for the duration of the COVID-19 declaration under Section  564(b)(1) of the Act, 21 U.S.C. section 360bbb-3(b)(1), unless the authorization is terminated or revoked sooner.  Performed at [facility], [address], [place], [place] [address]      Labs: Basic Metabolic Panel: Recent Labs  Lab 01/17/23 1804 01/18/23 1004 01/19/23 0428 01/20/23 0434 01/21/23 0401  NA 143 144 140  --  136  K 2.9* 2.9* 4.1 4.0 3.4*  CL 100 100 101  --  99  CO2 27 26 25   --  29  GLUCOSE 74 84 76  --  101*  BUN 18 18 17   --  15  CREATININE 0.80 0.86 0.71  --  0.58  CALCIUM 9.1 9.3 9.0  --  8.2*  MG 2.5*  --  2.2 2.2 2.2  PHOS  --   --  3.5  --   --    Liver Function Tests: Recent Labs  Lab 01/19/23 0428  AST 18  ALT 10  ALKPHOS 44  BILITOT 1.1  PROT 7.1  ALBUMIN 3.7   No results for input(s): "LIPASE", "AMYLASE" in the last 168 hours. No results for input(s): "AMMONIA" in the last 168 hours. CBC: Recent Labs  Lab 01/17/23 1804 01/19/23 0718  WBC 3.6* 3.5*  HGB 13.4 13.3  HCT 41.8 41.8  MCV 86.5 86.5  PLT 212 197   Cardiac Enzymes: No results for input(s): "CKTOTAL", "CKMB", "CKMBINDEX", "TROPONINI" in the last 168 hours. BNP: BNP (last 3 results) No results for input(s): "BNP" in the last 8760 hours.  ProBNP (last 3 results) No results for input(s): "PROBNP" in the last 8760 hours.  CBG: No results for input(s): "GLUCAP" in the last 168 hours.     Signed:  Rhetta Mura MD   [facility] 01/22/2023, 1:51 PM

## 2023-01-22 NOTE — Progress Notes (Signed)
    Patient Name: [name]           DOB: [birth year]  MRN: [id number]      Admission Date: 01/08/2023  Attending Provider: Rhetta Mura, MD  Primary Diagnosis: Hypokalemia   Level of care: Telemetry    CROSS COVER NOTE   Date of Service   01/22/2023   Marlea Verzosa, 62 y.o. female, was admitted on 01/08/2023 for Hypokalemia.    HPI/Events of Note   Hypotension, SBP 80s, MAP> 60.  Asymptomatic Previous SBP~90's Per previous cardiology notes, history of hypotension previously on midodrine, bradycardia No signs or symptoms of infection, acute blood loss, GI loss Has not received sedating medications tonight Profound malnutrition/protein deficiency likely contributing to hypotension Goal MAP> 65   Interventions/ Plan   Fluid bolus, 750 cc total Continuous fluid x 6 hrs Orthostatic vitals        Anthoney Harada, DNP, ACNPC- AG [facility]

## 2023-01-22 NOTE — TOC Transition Note (Addendum)
Transition of Care [facility]) - CM/SW Discharge Note   Patient Details  Name: [name] MRN: [id number] Date of Birth: 24-May-1961  [facility] CM/SW Contact:  Adrian Prows, RN Phone Number: 01/22/2023, 3:23 PM   Clinical Narrative:    D/C orders received; pt's IVC expired; unable to schedule appt w/ PCP; see today's TOC progress note; spoke w/ pt's sister [name] and she accepts [facility] SLP on pt's behalf; contacted Cindie Sillmon at [facility] and she says agency can provide service; pt's sister notified, and agency contact info placed in follow up provider section of d/c instructions; no TOC needs.   Final next level of care: Home/Self Care Barriers to Discharge: No Barriers Identified   Patient Goals and CMS Choice      Discharge Placement                         Discharge Plan and Services Additional resources added to the After Visit Summary for                                       Social Determinants of Health (SDOH) Interventions SDOH Screenings   Food Insecurity: No Food Insecurity (01/19/2023)  Housing: Low Risk  (01/19/2023)  Transportation Needs: No Transportation Needs (01/19/2023)  Utilities: Not At Risk (01/19/2023)  Alcohol Screen: Low Risk  (04/08/2022)  Tobacco Use: High Risk (01/22/2023)     Readmission Risk Interventions     No data to display

## 2023-01-30 ENCOUNTER — Other Ambulatory Visit (HOSPITAL_COMMUNITY): Payer: Self-pay

## 2023-02-16 ENCOUNTER — Other Ambulatory Visit (HOSPITAL_COMMUNITY): Payer: Self-pay

## 2023-11-18 ENCOUNTER — Other Ambulatory Visit (HOSPITAL_COMMUNITY): Payer: Self-pay

## 2023-11-22 ENCOUNTER — Other Ambulatory Visit (HOSPITAL_COMMUNITY): Payer: Self-pay

## 2023-12-09 IMAGING — DX DG ABDOMEN 1V
1 series · 1 of 1 positions shown · non-contrast
Comparison: None.

CLINICAL DATA: NG tube placement

EXAM:
ABDOMEN - 1 VIEW

[abdomen supine]
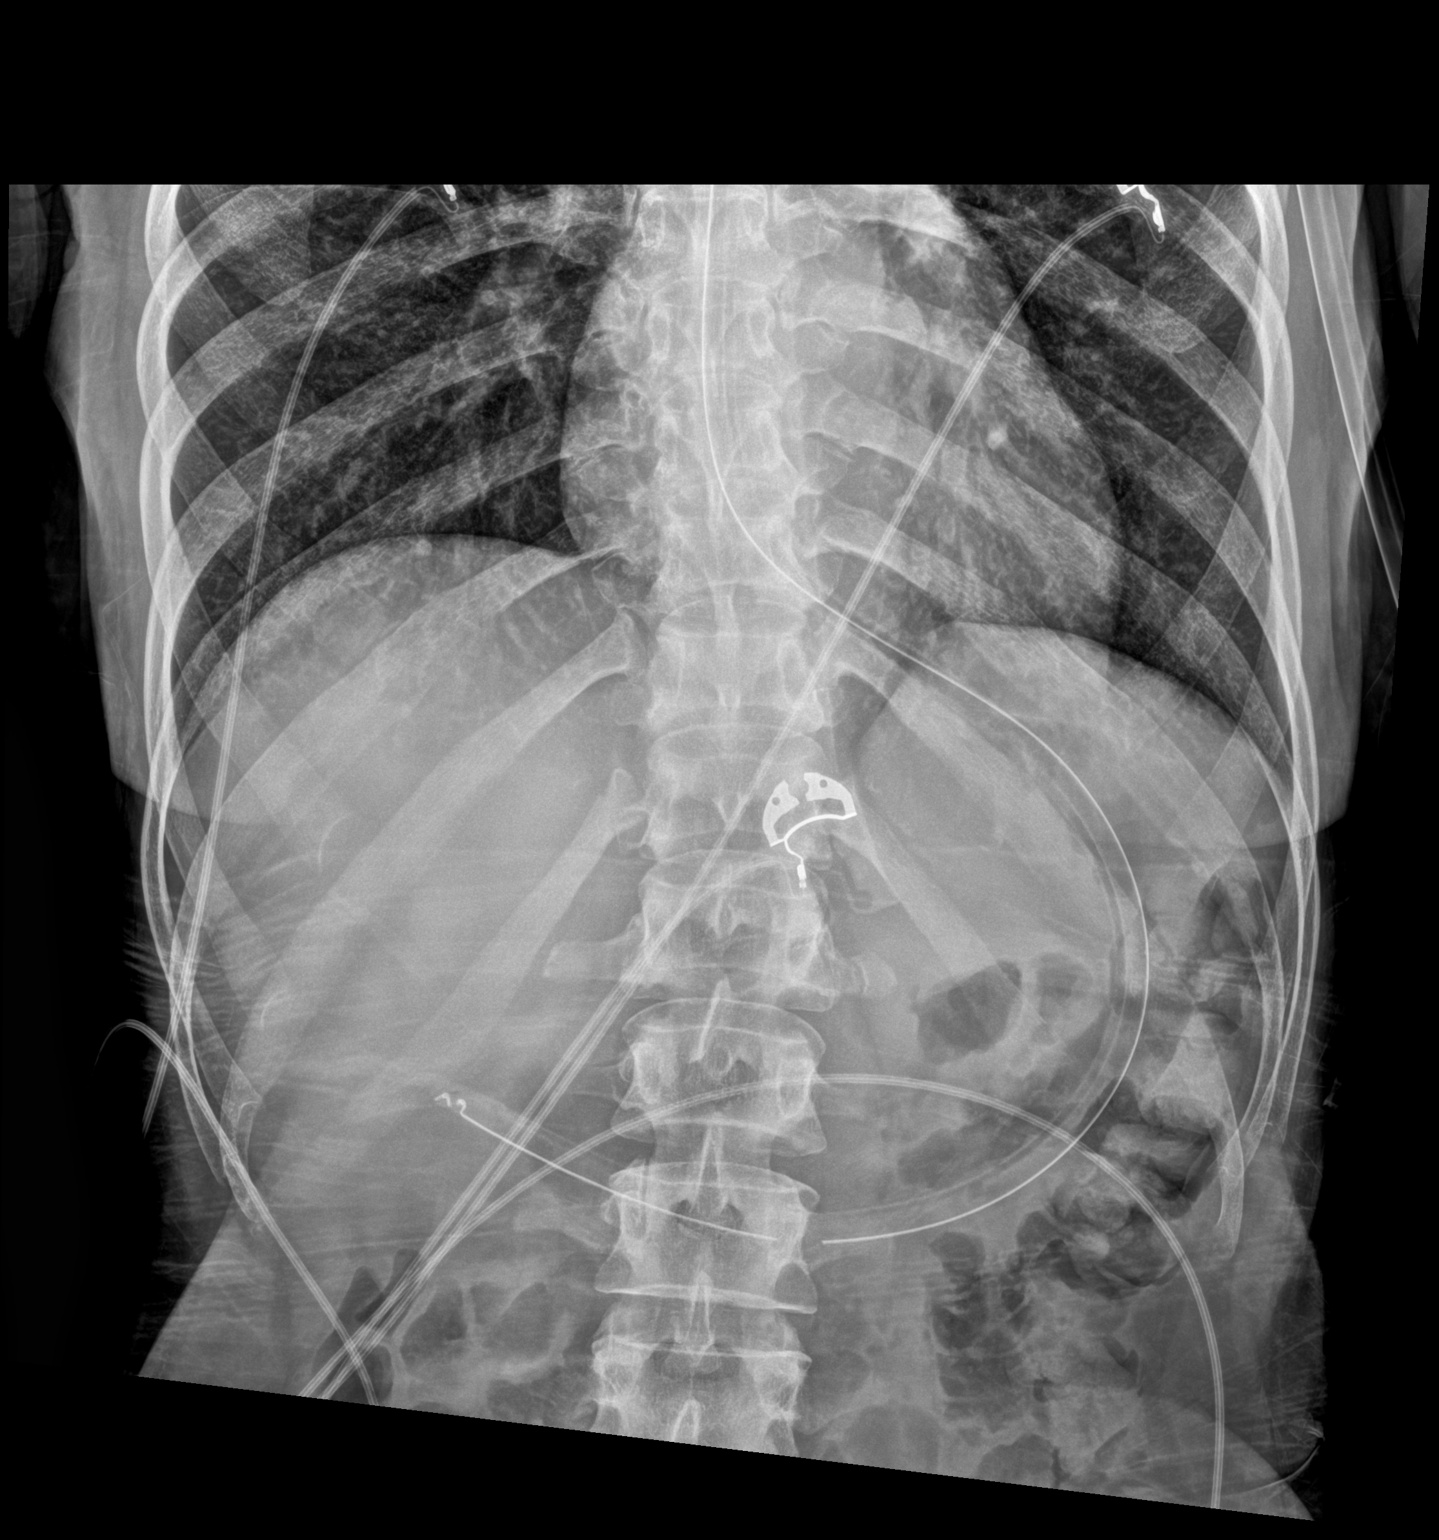

[1 of 1 positions shown; findings below may reference images not displayed]

FINDINGS: Enteric tube is present with tip in the right upper quadrant
consistent with location in the distal stomach. Visualized bowel gas
pattern is normal. Visualized lung bases are clear.
IMPRESSION: Enteric tube tip in the right upper quadrant consistent with
location in the distal stomach.

## 2024-01-07 ENCOUNTER — Other Ambulatory Visit (HOSPITAL_COMMUNITY): Payer: Self-pay

## 2024-01-10 ENCOUNTER — Other Ambulatory Visit (HOSPITAL_COMMUNITY): Payer: Self-pay

## 2024-01-19 ENCOUNTER — Other Ambulatory Visit (HOSPITAL_COMMUNITY): Payer: Self-pay

## 2024-04-30 ENCOUNTER — Ambulatory Visit (HOSPITAL_COMMUNITY)
Admission: EM | Admit: 2024-04-30 | Discharge: 2024-05-02 | Attending: Nurse Practitioner | Admitting: Nurse Practitioner

## 2024-04-30 DIAGNOSIS — R7309 Other abnormal glucose: Secondary | ICD-10-CM | POA: Diagnosis not present

## 2024-04-30 DIAGNOSIS — R451 Restlessness and agitation: Secondary | ICD-10-CM | POA: Diagnosis not present

## 2024-04-30 DIAGNOSIS — F200 Paranoid schizophrenia: Secondary | ICD-10-CM | POA: Insufficient documentation

## 2024-04-30 DIAGNOSIS — F29 Unspecified psychosis not due to a substance or known physiological condition: Secondary | ICD-10-CM | POA: Diagnosis present

## 2024-04-30 DIAGNOSIS — R4587 Impulsiveness: Secondary | ICD-10-CM | POA: Insufficient documentation

## 2024-04-30 LAB — GLUCOSE, CAPILLARY
Glucose-Capillary: 85 mg/dL (ref 70–99)
Glucose-Capillary: 79 mg/dL (ref 70–99)

## 2024-04-30 LAB — URINALYSIS, ROUTINE W REFLEX MICROSCOPIC
Bacteria, UA: NONE SEEN
Bilirubin Urine: NEGATIVE
Glucose, UA: NEGATIVE mg/dL
Ketones, ur: 5 mg/dL — AB
Leukocytes,Ua: NEGATIVE
Nitrite: NEGATIVE
Protein, ur: NEGATIVE mg/dL
Specific Gravity, Urine: 1.014 (ref 1.005–1.030)
pH: 5 (ref 5.0–8.0)

## 2024-04-30 LAB — POCT URINE DRUG SCREEN - MANUAL ENTRY (I-SCREEN)
POC Amphetamine UR: NOT DETECTED
POC Buprenorphine (BUP): NOT DETECTED
POC Cocaine UR: NOT DETECTED
POC Marijuana UR: NOT DETECTED
POC Methadone UR: NOT DETECTED
POC Methamphetamine UR: NOT DETECTED
POC Morphine: NOT DETECTED
POC Oxazepam (BZO): POSITIVE — AB
POC Oxycodone UR: NOT DETECTED
POC Secobarbital (BAR): NOT DETECTED

## 2024-04-30 LAB — SARS CORONAVIRUS 2 BY RT PCR: SARS Coronavirus 2 by RT PCR: NEGATIVE

## 2024-04-30 MED ORDER — HALOPERIDOL 5 MG PO TABS: 5.0000 mg | ORAL_TABLET | Freq: Three times a day (TID) | ORAL | Status: DC | PRN

## 2024-04-30 MED ORDER — DIPHENHYDRAMINE HCL 50 MG/ML IJ SOLN: 50.0000 mg | Freq: Three times a day (TID) | INTRAMUSCULAR | Status: DC | PRN

## 2024-04-30 MED ORDER — HYDROXYZINE HCL 25 MG PO TABS: 25.0000 mg | ORAL_TABLET | Freq: Three times a day (TID) | ORAL | Status: DC | PRN

## 2024-04-30 MED ORDER — LORAZEPAM 1 MG PO TABS: 1.0000 mg | ORAL_TABLET | Freq: Once | ORAL | Status: AC

## 2024-04-30 MED ORDER — MAGNESIUM HYDROXIDE 400 MG/5ML PO SUSP: 30.0000 mL | Freq: Every day | ORAL | Status: DC | PRN

## 2024-04-30 MED ORDER — LORAZEPAM 2 MG/ML IJ SOLN: 2.0000 mg | Freq: Three times a day (TID) | INTRAMUSCULAR | Status: DC | PRN

## 2024-04-30 MED ORDER — HALOPERIDOL LACTATE 5 MG/ML IJ SOLN: 10.0000 mg | Freq: Three times a day (TID) | INTRAMUSCULAR | Status: DC | PRN

## 2024-04-30 MED ORDER — HALOPERIDOL LACTATE 5 MG/ML IJ SOLN: 5.0000 mg | Freq: Three times a day (TID) | INTRAMUSCULAR | Status: DC | PRN

## 2024-04-30 MED ORDER — TRAZODONE HCL 50 MG PO TABS: 50.0000 mg | ORAL_TABLET | Freq: Every evening | ORAL | Status: DC | PRN

## 2024-04-30 MED ORDER — ALUM & MAG HYDROXIDE-SIMETH 200-200-20 MG/5ML PO SUSP: 30.0000 mL | ORAL | Status: DC | PRN

## 2024-04-30 MED ORDER — LORAZEPAM 2 MG/ML IJ SOLN
1.0000 mg | Freq: Once | INTRAMUSCULAR | Status: AC
  Administered 2024-04-30: 1 mg via INTRAMUSCULAR
  Filled 2024-04-30: qty 1

## 2024-04-30 MED ORDER — ACETAMINOPHEN 325 MG PO TABS: 650.0000 mg | ORAL_TABLET | Freq: Four times a day (QID) | ORAL | Status: DC | PRN

## 2024-04-30 MED ORDER — HALOPERIDOL LACTATE 5 MG/ML IJ SOLN
5.0000 mg | Freq: Once | INTRAMUSCULAR | Status: AC
  Administered 2024-04-30: 5 mg via INTRAMUSCULAR
  Filled 2024-04-30: qty 1

## 2024-04-30 MED ORDER — DIPHENHYDRAMINE HCL 50 MG PO CAPS: 50.0000 mg | ORAL_CAPSULE | Freq: Three times a day (TID) | ORAL | Status: DC | PRN
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
## 2024-04-30 NOTE — ED Notes (Signed)
 Patient resting in lounger with eyes closed, respirations even and unlabored. Patient in no apparent acute distress. Environment secured. Safety checks in place per facility protocol.

## 2024-04-30 NOTE — ED Notes (Signed)
 Patient alert & oriented x4. Denies intent to harm self or others when asked. Endorses A/VH, Clinical research associate UTA further to determine command type or differentiate type as patient did not forward further information. Patient mumbles when she speaks, forwarding little. Patient denies any physical complaints when asked. No acute distress noted. Support and encouragement provided. Patient observed in milieu. Patient refused breakfast, hx of malnutrition. Writer encouraged PO intake, patient refused again stating she just isn't hungry. No inappropriate behaviors observed or reported. Routine safety checks conducted per facility protocol. Encouraged patient to notify staff if any thoughts of harm towards self or others arise. Patient verbalizes understanding and agreement.

## 2024-04-30 NOTE — ED Notes (Signed)
D: Pt resting, eyes closed, respirations even and unlabored. No distress noted. A: Continue Q 15 min checks for safety. R: Pt remains safe on the unit.   

## 2024-04-30 NOTE — ED Notes (Signed)
 Patient is dozing in and out when she sits up. Patient is asked to lie down to avoid falling over. Patient was offered food/ drink, patient denied both. Patient is now lying flat on bed sleeping.

## 2024-04-30 NOTE — ED Notes (Signed)
 CBG obtained: 85. Patient refusing EKG and labs stating I'm not sick. Y'all don't need anything because I'm not sick. I can leave. Provider [name], NP made aware. Environment secured, safety checks in place per facility policy.

## 2024-04-30 NOTE — Progress Notes (Signed)
 Patient has been denied by [facility] due to no appropriate beds available. Patient meets BH inpatient criteria per [name], NP. Patient has been faxed out to the following facilities:   [facility]  [address] [place] [place] [address] [phone]  [facility]  [address], [place] [address] [phone]  [facility]  [address], [place] [address] [phone]  [name]  [address] [place], [place] [address] [phone]  [facility] Patient Placement  [facility] [place] [phone]  [facility]  [address] [place] [address] [phone]  [facility]  [address] [place] [address] [phone]  [facility]  [address] [place], [place] [phone]  [facility]  [address] [place] [address] [phone]  [facility]  [address] [place] [address] [phone]  [facility]  [address] [place] [address] [phone]  [facility]  [address] [place], [place] [address] [phone]  [facility]  [address] [place] [address] [phone]  [facility]  [address], [place] [address] [phone]  [facility]  [address] [place] [address] [phone]  [facility]  [address] [place] [address] [phone]  [facility]  [address] [place] [address]  [phone]   Bunnie Gallop, MSW, LCSW-A  10:39 AM 04/30/2024

## 2024-04-30 NOTE — ED Notes (Signed)
 Patient refused lunch
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
## 2024-04-30 NOTE — Progress Notes (Signed)
 CSW sent additional [facility] referral information to [facility] per their request via fax. CSW will continue to monitor the patient to secure recommended disposition.    Bunnie Gallop, MSW, LCSW-A  11:18 AM 04/30/2024
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
## 2024-04-30 NOTE — ED Notes (Signed)
 CBG obtained: 79. Patient states I'm not addicted to sweeteners. Writer attempted to educate patient that we are monitoring her CBG level due to her not eating. Patient then began saying No, no, no, no. Patient refused meal. Provider [name], NP notified.

## 2024-05-01 DIAGNOSIS — F200 Paranoid schizophrenia: Secondary | ICD-10-CM

## 2024-05-01 LAB — CBC WITH DIFFERENTIAL/PLATELET
Abs Immature Granulocytes: 0.01 K/uL (ref 0.00–0.07)
Basophils Absolute: 0 K/uL (ref 0.0–0.1)
Basophils Relative: 1 %
Eosinophils Absolute: 0 K/uL (ref 0.0–0.5)
Eosinophils Relative: 1 %
HCT: 47.1 % — ABNORMAL HIGH (ref 36.0–46.0)
Hemoglobin: 15.2 g/dL — ABNORMAL HIGH (ref 12.0–15.0)
Immature Granulocytes: 0 %
Lymphocytes Relative: 31 %
Lymphs Abs: 2 K/uL (ref 0.7–4.0)
MCH: 27.1 pg (ref 26.0–34.0)
MCHC: 32.3 g/dL (ref 30.0–36.0)
MCV: 84.1 fL (ref 80.0–100.0)
Monocytes Absolute: 0.5 K/uL (ref 0.1–1.0)
Monocytes Relative: 7 %
Neutro Abs: 4 K/uL (ref 1.7–7.7)
Neutrophils Relative %: 60 %
Platelets: 245 K/uL (ref 150–400)
RBC: 5.6 MIL/uL — ABNORMAL HIGH (ref 3.87–5.11)
RDW: 13.3 % (ref 11.5–15.5)
WBC: 6.5 K/uL (ref 4.0–10.5)
nRBC: 0 % (ref 0.0–0.2)

## 2024-05-01 LAB — LIPID PANEL
Cholesterol: 222 mg/dL — ABNORMAL HIGH (ref 0–200)
HDL: 79 mg/dL (ref >40)
LDL Cholesterol: 130 mg/dL — ABNORMAL HIGH (ref 0–99)
Total CHOL/HDL Ratio: 2.8 ratio
Triglycerides: 63 mg/dL (ref <150)
VLDL: 13 mg/dL (ref 0–40)

## 2024-05-01 LAB — COMPREHENSIVE METABOLIC PANEL WITH GFR
ALT: 16 U/L (ref 0–44)
AST: 24 U/L (ref 15–41)
Albumin: 4.3 g/dL (ref 3.5–5.0)
Alkaline Phosphatase: 78 U/L (ref 38–126)
Anion gap: 17 — ABNORMAL HIGH (ref 5–15)
BUN: 12 mg/dL (ref 8–23)
CO2: 23 mmol/L (ref 22–32)
Calcium: 9.3 mg/dL (ref 8.9–10.3)
Chloride: 100 mmol/L (ref 98–111)
Creatinine, Ser: 0.92 mg/dL (ref 0.44–1.00)
GFR, Estimated: >60 mL/min (ref >60)
Glucose, Bld: 65 mg/dL — ABNORMAL LOW (ref 70–99)
Potassium: 3.5 mmol/L (ref 3.5–5.1)
Sodium: 140 mmol/L (ref 135–145)
Total Bilirubin: 1.1 mg/dL (ref 0.0–1.2)
Total Protein: 7.6 g/dL (ref 6.5–8.1)

## 2024-05-01 LAB — VALPROIC ACID LEVEL: Valproic Acid Lvl: <10 ug/mL — ABNORMAL LOW (ref 50–100)

## 2024-05-01 LAB — TSH: TSH: 1.352 u[IU]/mL (ref 0.350–4.500)

## 2024-05-01 LAB — ETHANOL: Alcohol, Ethyl (B): <15 mg/dL (ref <15)

## 2024-05-01 MED ORDER — OLANZAPINE 10 MG IM SOLR
5.0000 mg | Freq: Once | INTRAMUSCULAR | Status: DC
  Filled 2024-05-01: qty 10

## 2024-05-01 NOTE — ED Notes (Addendum)
 Nurse was able to obtain VS and BS. Pt continues to be agitated and refuses to take fluids. States  I want to go home

## 2024-05-01 NOTE — ED Notes (Signed)
 Asked pt if she would like something to eat or drink, pt replied, No, I just want to go home. Will continue to encourage food and fluids. Notified provider.

## 2024-05-01 NOTE — ED Notes (Signed)
D: Pt resting, eyes closed, respirations even and unlabored. No distress noted. A: Continue Q 15 min checks for safety. R: Pt remains safe on the unit.   

## 2024-05-01 NOTE — ED Notes (Signed)
 Nurse offered Gatorade but patient refused to take it saying I just want to go home. Gatorade left by the bedside and patient is encouraged to take it.

## 2024-05-01 NOTE — ED Notes (Signed)
 Pt sitting on edge of recliner, talking to self. Appears to be responding to internal stimuli

## 2024-05-01 NOTE — ED Notes (Signed)
 Pt sleeping at this time. Rise and fall of chest noted. Pt in NAD at this time. Will continue to monitor.

## 2024-05-01 NOTE — Progress Notes (Signed)
 LCSW Progress Note  [id number]   [name]  05/01/2024  9:29 AM  Description:   Inpatient Psychiatric Referral  Patient was recommended inpatient per [name] (NP). There are no available beds at [facility], per [facility] [name]. Patient was referred to the following out of network facilities: Destination  Service Provider Address Phone Fax  [facility]  [address] [place] [place] [address] [phone]  [facility]  [address] [place] [address] [phone]  [facility]  [address], [place] [address] [phone]  [facility]  [address] [place], [place] [address] [phone]  [facility] Health Patient Placement  [facility], [place] [phone]  [facility]  [address] [place] [address] [phone]  [facility]  [address] [place] [address] [phone]  [facility] EFAX  [address] [place], [place] [phone]  [facility]  [address] [place] [address] [phone]  [facility]  [address], [place] [address] [phone]  [facility]  [address] [place] [address] [phone]  [facility]  [address] [place], [place] [address] [phone]  [facility]  [address] [place] [address] [phone]  [facility]  [address] [place] [address] [phone]  [facility]  [address] [place] [address] [phone]  [phone]  [facility]  [address], [place] [address] [phone]  [facility]  [address] [place] [address] [phone]      Situation ongoing, CSW to continue following and update chart as more information becomes available.   [place] Tandy Grawe , MSW, LCSW  05/01/2024 9:29 AM

## 2024-05-01 NOTE — ED Provider Notes (Incomplete)
 Behavioral Health Progress Note  Date and Time: 05/01/2024 10:21 AM Name: [name] MRN:  [id number]   05/01/24 Assessment: Pt was laying on her chair bed when approached this morning. Pt is irritable and refuses to answer questions. Pt responded,  What do you want, and That's not what I'm here for.Pt's speech was garbled and incoherent at times. Pt's speech was also heard as word salad and clanging during the assessment. Pt continues to refuse to eat or drink.   HPI:  Per admission note 04/30/24: [name] is 63 y/o female with a psychiatric history of paranoid schizophrenia presented to [facility] brought in [employer] under IVC with complaints of aggressive behaviors towards her mother. NP spoke with patient's mother Loralee Weitzman and she reported that she pushed her mother down yelling and screaming. 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 Mother reports that patient is unmedicated and refuses to go to the doctor. Mother reports that respondent is constantly talking loudly to herself and yelling at people that are not present. Mother reports that patient symptoms has worsened after the death of respondent's sister in July 2025 who was also the legal guardian for respondent. Mother reports after respondent's sister died patient moved in her home. Mother reports that respondent's brother has filed for guardianship for patient but it has not been granted as of yet. Patient stood in the assessment room with aggressive posturing and pacing back and forth in the room. Patient refused to answer any questions during the assessment, but displayed rambling, disorganized speech and spoke unintelligible words. Patient would turn her head to the side as if she speaking to someone else in the room. Patient demanded to taken back home. When NP asked  if she lived at home with her mother she stated that she did not have a mother. NP spoke with patient's brother Vanessia Bokhari [phone] and he is worried about patient continuing to reside with their 57 y/o mother with patient's level of aggression. During evaluation [name] is standing in the assessment room in no acute distress.  She is alert, oriented x 2, her mood is labile with an angry, hostile affect.  She has disorganized, rambling, pressured speech with nonsensical words.  Patient appears to be responding to internal or external stimuli, it is difficult to determine at this time.   Diagnosis:  Final diagnoses:  Paranoid schizophrenia (HCC)   Total Time spent with patient: 15 minutes  Past Psychiatric History: Paranoid schizophrenia, multiple hospitalizations including [facility] and has trialed ECT.  Past Medical History:  Diagnosis Date   Anemia 10/02/2021   Non compliance w medication regimen     Schizophrenia [facility])     Patient Active Problem List    Diagnosis Date Noted   Psychosis (HCC) 01/20/2023   Schizoaffective disorder, depressive type (HCC) 04/08/2022   Ventricular tachycardia (HCC) 10/13/2021   Sinus bradycardia 10/03/2021   Anemia of chronic disease 10/02/2021   Hypomagnesemia 09/22/2021   Hypokalemia 09/22/2021   Hypophosphatemia 09/21/2021   Hyponatremia 09/20/2021   Metabolic acidosis 09/16/2021   Protein-calorie malnutrition, severe 03/27/2020   Hypotension 03/26/2020   Anorexia 03/26/2020   Hypokalemia, hypomagnesemia and hypophosphatemia 03/26/2020   Hypocalcemia 03/26/2020   Fall at home, initial encounter 03/26/2020   Recurrent hypoglycemia and severe malnutrition 03/14/2020   Paranoid schizophrenia (HCC) 03/01/2020  Noncompliance with diet and medication regimen     Psychoses (HCC)     Tobacco abuse 03/12/2014   Hypokalemic alkalosis 11/11/2011   Medically noncompliant    Family History: None reported Family Psychiatric  History:  None reported Social History: 63 y/o female, unmarried, no children, lives at home with her mother. Sister was LG but died in August 05, 2025and brother is currently awaiting approval for guardianship, Kimisha Eunice [phone].   Additional Social History:    Pain Medications: Please see MAR Prescriptions: Please see MAR Over the Counter: Please see MAR History of alcohol / drug use?:  (Pt refused to answer) Longest period of sobriety (when/how long): UTA       Sleep: Good  Appetite:  Poor  Current Medications:  Current Facility-Administered Medications  Medication Dose Route Frequency Provider Last Rate Last Admin   acetaminophen  (TYLENOL ) tablet 650 mg  650 mg Oral Q6H PRN Bobbitt, Shalon E, NP       alum & mag hydroxide-simeth (MAALOX/MYLANTA) 200-200-20 MG/5ML suspension 30 mL  30 mL Oral Q4H PRN Bobbitt, Shalon E, NP       haloperidol  (HALDOL ) tablet 5 mg  5 mg Oral TID PRN Bobbitt, Shalon E, NP       And   diphenhydrAMINE  (BENADRYL ) capsule 50 mg  50 mg Oral TID PRN Bobbitt, Shalon E, NP       haloperidol  lactate (HALDOL ) injection 5 mg  5 mg Intramuscular TID PRN Bobbitt, Shalon E, NP       And   diphenhydrAMINE  (BENADRYL ) injection 50 mg  50 mg Intramuscular TID PRN Bobbitt, Shalon E, NP       haloperidol  lactate (HALDOL ) injection 10 mg  10 mg Intramuscular TID PRN Bobbitt, Shalon E, NP       And   diphenhydrAMINE  (BENADRYL ) injection 50 mg  50 mg Intramuscular TID PRN Bobbitt, Shalon E, NP       hydrOXYzine  (ATARAX ) tablet 25 mg  25 mg Oral TID PRN Bobbitt, Shalon E, NP       magnesium  hydroxide (MILK OF MAGNESIA) suspension 30 mL  30 mL Oral Daily PRN Bobbitt, Shalon E, NP       traZODone  (DESYREL ) tablet 50 mg  50 mg Oral QHS PRN Bobbitt, Shalon E, NP       No current outpatient medications on file.    Labs  Lab Results:  Admission on 04/30/2024  Component Date Value Ref Range Status   POC Amphetamine UR 04/30/2024 None Detected  NONE DETECTED (Cut Off Level 1000  ng/mL) Final   POC Secobarbital (BAR) 04/30/2024 None Detected  NONE DETECTED (Cut Off Level 300 ng/mL) Final   POC Buprenorphine (BUP) 04/30/2024 None Detected  NONE DETECTED (Cut Off Level 10 ng/mL) Final   POC Oxazepam (BZO) 04/30/2024 Positive (A)  NONE DETECTED (Cut Off Level 300 ng/mL) Final   POC Cocaine UR 04/30/2024 None Detected  NONE DETECTED (Cut Off Level 300 ng/mL) Final   POC Methamphetamine UR 04/30/2024 None Detected  NONE DETECTED (Cut Off Level 1000 ng/mL) Final   POC Morphine 04/30/2024 None Detected  NONE DETECTED (Cut Off Level 300 ng/mL) Final   POC Methadone UR 04/30/2024 None Detected  NONE DETECTED (Cut Off Level 300 ng/mL) Final   POC Oxycodone UR 04/30/2024 None Detected  NONE DETECTED (Cut Off Level 100 ng/mL) Final   POC Marijuana UR 04/30/2024 None Detected  NONE DETECTED (Cut Off Level 50 ng/mL) Final   Color, Urine 04/30/2024 YELLOW  YELLOW Final  APPearance 04/30/2024 CLEAR  CLEAR Final   Specific Gravity, Urine 04/30/2024 1.014  1.005 - 1.030 Final   pH 04/30/2024 5.0  5.0 - 8.0 Final   Glucose, UA 04/30/2024 NEGATIVE  NEGATIVE mg/dL Final   Hgb urine dipstick 04/30/2024 SMALL (A)  NEGATIVE Final   Bilirubin Urine 04/30/2024 NEGATIVE  NEGATIVE Final   Ketones, ur 04/30/2024 5 (A)  NEGATIVE mg/dL Final   Protein, ur 89/87/7974 NEGATIVE  NEGATIVE mg/dL Final   Nitrite 89/87/7974 NEGATIVE  NEGATIVE Final   Leukocytes,Ua 04/30/2024 NEGATIVE  NEGATIVE Final   RBC / HPF 04/30/2024 0-5  0 - 5 RBC/hpf Final   WBC, UA 04/30/2024 0-5  0 - 5 WBC/hpf Final   Bacteria, UA 04/30/2024 NONE SEEN  NONE SEEN Final   Squamous Epithelial / HPF 04/30/2024 0-5  0 - 5 /HPF Final   Mucus 04/30/2024 PRESENT   Final   Performed at [facility], [address], [place], [place] [address]   SARS Coronavirus 2 by RT PCR 04/30/2024 NEGATIVE  NEGATIVE Final   Performed at [facility], [address], [place], [place] [address]   Glucose-Capillary 04/30/2024 85  70 - 99  mg/dL Final   Glucose reference range applies only to samples taken after fasting for at least 8 hours.   Glucose-Capillary 04/30/2024 79  70 - 99 mg/dL Final   Glucose reference range applies only to samples taken after fasting for at least 8 hours.    Blood Alcohol level:  Lab Results  Component Value Date   ETH <10 01/08/2023   ETH <10 03/29/2022    Metabolic Disorder Labs: Lab Results  Component Value Date   HGBA1C 5.6 10/21/2021   MPG 114 10/21/2021   MPG 111 10/02/2021   Lab Results  Component Value Date   PROLACTIN 13.6 04/16/2015   Lab Results  Component Value Date   CHOL 189 10/21/2021   TRIG 52 10/21/2021   HDL 50 10/21/2021   CHOLHDL 3.8 10/21/2021   VLDL 10 10/21/2021   LDLCALC 129 (H) 10/21/2021   LDLCALC 93 10/01/2020    Therapeutic Lab Levels: Lab Results  Component Value Date   LITHIUM  <0.06 (L) 01/09/2023   LITHIUM  0.58 (L) 05/25/2022   No results found for: VALPROATE Lab Results  Component Value Date   CBMZ <2.0 (L) 01/25/2016    Physical Findings   AIMS    Flowsheet Row Admission (Discharged) from 04/08/2022 in [facility] INPATIENT BEHAVIORAL MEDICINE Admission (Discharged) [facility] (Discharged) from 03/15/2020 in [facility] Emergency Department at [facility] ED to Hosp-Admission (Discharged) from 03/01/2020 in [facility] Emergency Department at [facility] ED to Hosp-Admission (Discharged) from [facility]  AIMS Total Score 0 0 0 0 0   AUDIT    Flowsheet Row Admission (Discharged) from 04/08/2022 in [facility] INPATIENT BEHAVIORAL MEDICINE Admission (Discharged) from 10/17/2021 in [facility] BE[facility]IORAL MEDICINE Admission (Discharged) from [facility] (Discharged) from [facility] Hosp-Admission  (Discharged) from 03/15/2020 in [facility] Emergency Department at [facility]  Alcohol Use Disorder Identification Test Final Score (AUDIT) 0 5 0 0 0   ECT-MADRS    Flowsheet Row Admission (Discharged) from 10/02/2021 in [facility]AL CARDIAC MED PCU  MADRS Total Score 48   Flowsheet Row ED from 04/30/2024 in [facility] ED to Hosp-Admission (Discharged) from 01/08/2023 in [place] LONG 4TH FLOOR PROGRESSIVE CARE AND UROLOGY Admission (Discharged) from 04/08/2022 in [facility] INPATIENT BEHAVIORAL MEDICINE  C-SSRS RISK CATEGORY No Risk No Risk No Risk     Musculoskeletal  Strength & Muscle Tone: decreased Gait & Station: normal Patient leans: [name]  Psychiatric Specialty Exam  Presentation  General Appearance:  Disheveled  Eye Contact: Minimal  Speech: Garbled, Clanging, Pressured Speech Volume: Increased  Handedness: Right  Mood and Affect  Mood: Irritable; Labile Angry  Affect: Labile  Thought Process  Thought Processes: Disorganized  Descriptions of Associations:Loose  Orientation:Partial  Thought Content:Scattered  Diagnosis of Schizophrenia or Schizoaffective disorder in past: Yes  Duration of Psychotic Symptoms: Greater than six months  Hallucinations:Hallucinations: Auditory Description of Auditory Hallucinations: Patieint is talking to herself and appears to be responding to internal stimuli  Ideas of Reference:Delusions  Suicidal Thoughts:Suicidal Thoughts: No  Homicidal Thoughts:Homicidal Thoughts: No   Sensorium  Memory: Immediate Poor; Recent Poor; Remote Poor  Judgment: Impaired  Insight: None  Executive Functions  Concentration: Poor  Attention Span: Poor  Recall: Poor  Fund of Knowledge: Poor  Language: Poor   Psychomotor Activity  Psychomotor Activity: Psychomotor Activity: Normal   Assets  Assets: Physical Health; Resilience   Sleep  Sleep: Sleep: -- (unable to  assess) No Safety Checks orders active in given range  Nutritional Assessment (For OBS and [facility] admissions only) Has the patient had a weight loss or gain of 10 pounds or more in the last 3 months?: -- (unable to assess) Has the patient had a decrease in food intake/or appetite?: -- (unable to assess) Does the patient have dental problems?: -- (unable to assess) Does the patient have eating habits or behaviors that may be indicators of an eating disorder including binging or inducing vomiting?: -- (unable to assess) Has the patient recently lost weight without trying?: -- (unable to assess) Has the patient been eating poorly because of a decreased appetite?: -- (unable to assess)    Physical Exam  Physical Exam ROS Blood pressure 103/69, pulse (!) 59, temperature 98.1 F (36.7 C), temperature source Oral, resp. rate 16, SpO2 100%. There is no height or weight on file to calculate BMI.  Treatment Plan Summary: Daily contact with patient to assess and evaluate symptoms and progress in treatment and Medication management. Pt continues to be recommended for inpatient psychiatric treatment for psychosis and aggressive behaviors. Pt is under IVC. Pt is recommended that pt be transferred to ED if continues to refuse intake, labs and EKG throughout the night or if VS or CBG become unstable.   Curtistine Clause, RN 05/01/2024 10:21 AM

## 2024-05-01 NOTE — ED Notes (Signed)
Patient refused vitals signs

## 2024-05-01 NOTE — ED Notes (Signed)
 Pt irritable and when asked if she is having suicidal or homicidal thoughts, pt said, no, no; I just want to leave. Pt w/ irritable tone in voice. Pt still refusing food and drink. Will continue to monitor and encourage food and fluids.

## 2024-05-01 NOTE — ED Notes (Signed)
 Pt observed/assessed in recliner sleeping. RR even and unlabored, appearing in no noted distress. Environmental check complete, will continue to monitor for safety

## 2024-05-01 NOTE — ED Notes (Signed)
 Patient refuse Q4 vitals.

## 2024-05-01 NOTE — ED Provider Notes (Signed)
 Behavioral Health Urgent Care Medical Screening Exam  Patient Name: [name] MRN: [id number] Date of Evaluation: 05/01/24 Chief Complaint:  psychosis & aggressive behaviors Diagnosis:  Final diagnoses:  Paranoid schizophrenia (HCC)   History of Present illness: [name] is 63 y/o female with a psychiatric history of paranoid schizophrenia presented to [facility] brought in [employer] under IVC with complaints of aggressive behaviors towards her mother.    Patient assessment 05/01/2024: Psychosis is persistent today, patient is rambling with flight of ideas, she is tangential, verbalizing incomprehensible words as well as nonsensical words at times, using word salad at other times, she talks over writer, yells and becomes very agitated each time that she is approached for an assessment.  She states you are violating my privacy, I am not here for that, when being asked if she is having any suicidal thoughts, homicidal thoughts, or psychosis.  She persistently demands to leave, persistently says that there is some manipulation control going on over here, she presents illogical, irrational, and mostly tangential throughout encounter.  She talks about the reason for this presentation being her inability to get along with her mother, and this is the only thing that is logical and patient states throughout encounter.  Patient has persistently refused to allow for blood draws since admission to the observation unit.  Has consistently refused medications p.o. has persistently met criteria for involuntary commitment, due to her current mental status, as there is still patient evidence that she is continuing to be psychotic, which renders her at a high risk of danger to self and others as evidenced by reason for presentation which is aggressive behaviors towards her 96 year old mother; patient therefore stands at high risk of injuring her mother if discharged with this mental status.  Patient has been referred  to an outside facility, for inpatient hospitalization, [facility] called verbalized interest in getting patient hospitalized to the facility, however pending labs being completed, patient continues to be resistant at this time to allow for blood draws.  Ordering Zyprexa  5 mg twice daily for management of psychosis and aggressive behaviors, also ordering 5 mg as needed twice daily p.o. or IM for agitation and aggressive behaviors.  Nursing is encouraged to continue to provide positive reinforcements for patient to take medications, and nursing is also encouraged to continue to provide patient with positive reinforcements to allow for blood work to enhance placement.  Frequent offers of food and fluids should also be offered to ensure nutrition and hydration.  Flowsheet Row ED from 04/30/2024 in [facility] ED to Hosp-Admission (Discharged) from 01/08/2023 in [place] [place] LONG 4TH FLOOR PROGRESSIVE CARE AND UROLOGY Admission (Discharged) from 04/08/2022 in [facility] INPATIENT BEHAVIORAL MEDICINE  C-SSRS RISK CATEGORY No Risk No Risk No Risk    Psychiatric Specialty Exam  Presentation  General Appearance:Casual  Eye Contact:Fair  Speech:Pressured  Speech Volume:Increased  Handedness:Right   Mood and Affect  Mood: Angry  Affect: Congruent   Thought Process  Thought Processes: Disorganized  Descriptions of Associations:Tangential  Orientation:Partial  Thought Content:Illogical; Tangential  Diagnosis of Schizophrenia or Schizoaffective disorder in past: Yes  Duration of Psychotic Symptoms: Greater than six months  Hallucinations:Auditory Patieint is talking to herself and appears to be responding to internal stimuli  Ideas of Reference:Paranoia; Delusions; Percusatory  Suicidal Thoughts:No  Homicidal Thoughts:No   Sensorium  Memory: Recent Poor  Judgment: Poor  Insight: Poor   Executive Functions  Concentration: Poor  Attention  Span: Poor  Recall: Poor  Fund of Knowledge:  Poor  Language: Poor   Psychomotor Activity  Psychomotor Activity: Normal   Assets  Assets: Resilience   Sleep  Sleep: Poor  Number of hours: No data recorded  Physical Exam: Physical Exam Vitals and nursing note reviewed.  Neurological:     General: No focal deficit present.    Review of Systems  Psychiatric/Behavioral:  Positive for depression and hallucinations. Negative for memory loss, substance abuse and suicidal ideas. The patient is nervous/anxious and has insomnia.    Blood pressure 132/73, pulse 72, temperature 98.1 F (36.7 C), temperature source Oral, resp. rate 17, SpO2 99%. There is no height or weight on file to calculate BMI.  Musculoskeletal: Strength & Muscle Tone: within normal limits Gait & Station: normal Patient leans: [name]   [facility] MSE Discharge Disposition for Follow up and Recommendations: Based on my evaluation the patient appears to have an emergency mental health condition for which I recommend the patient be transferred to an inpatient behavioral health unit for treatment and stabilization.   Treatment Plan Summary: Daily contact with patient to assess and evaluate symptoms and progress in treatment and Medication management  Safety and Monitoring: Involuntary admission to inpatient psychiatric unit for safety, stabilization and treatment Daily contact with patient to assess and evaluate symptoms and progress in treatment Patient's case to be discussed in multi-disciplinary team meeting Observation Level : q15 minute checks Vital signs: q12 hours Precautions: Safety  Long Term Goal(s): Improvement in symptoms so as ready for discharge  Short Term Goals: Ability to identify changes in lifestyle to reduce recurrence of condition will improve, Ability to verbalize feelings will improve, Ability to disclose and discuss suicidal ideas, Ability to demonstrate self-control will improve,  Ability to identify and develop effective coping behaviors will improve, Ability to maintain clinical measurements within normal limits will improve, and Compliance with prescribed medications will improve  Diagnoses P. Schizophrenia  Medications: -Start Zyprexa  5 mg BID for psychosis   PRNS -Zyprexa  5 mg PO or IM for agitation -Continue Tylenol  650 mg every 6 hours PRN for mild pain -Continue Maalox 30 mg every 4 hrs PRN for indigestion -Continue Milk of Magnesia as needed every 6 hrs for constipation  Labs: Pending-Has been refusing  Discharge Planning: Social work and case management to assist with discharge planning and identification of hospital follow-up needs prior to discharge Estimated LOS: 5-7 days Discharge Concerns: Need to establish a safety plan; Medication compliance and effectiveness Discharge Goals: Return home with outpatient referrals for mental health follow-up including medication management/psychotherapy  I certify that inpatient services furnished can reasonably be expected to improve the patient's condition.    Donia Snell, NP 10/13/20258:05 PM   Donia Snell, NP 05/01/2024, 8:05 PM

## 2024-05-01 NOTE — ED Notes (Signed)
 Refused CBG, refused food and fluids. Notified provider.

## 2024-05-01 NOTE — ED Notes (Signed)
 Pt refused IM zyprexa  and CBG check, pt calmly stating, I'm good, I just want to leave. I'm not diabetic and not eating and drinking has nothing to do with it. Pt continued to refuse despite education as to why a CBG was necessary. Notified provider.

## 2024-05-01 NOTE — Progress Notes (Signed)
 Inpatient Psychiatric Referral  Patient was recommended inpatient per [name], NP. There are no available beds at [facility], per [facility]. Patient was referred to the following out of network facilities:  Destination  Service Provider Address Phone Fax  [facility]  [address] [place] [place] [address] [phone]  [facility]  [address] [place] [address] [phone]  [facility]  [address] [place] [address] [phone]  [facility]  [address] [place], [place] [address] [phone]  [facility] Patient Placement  [facility] [place] [phone]  [facility]  [address] [place] [address] [phone]  [facility]  [address] [place] [address] [phone]  [facility] EFAX  [address] [place], [place] [phone]  [facility]  [address] [place] [address] [phone]  [facility]  [address] [place] [address] [phone]  [facility]  [address] [place] [address] [phone]  [facility]  [address] [place], [place] [address] [phone]  [facility]  [address] [place] [address] [phone]  [facility]  [address], [place] [address] [phone]  [facility]  [address] [place] [address] [phone]  [facility]  [address], [place] [address] [phone]   [facility]  [address] [place] [address] [phone]    Situation ongoing, CSW to continue following and update chart as more information becomes available.   Harrie Sofia MSW, [place] 05/01/2024

## 2024-05-01 NOTE — ED Notes (Signed)
 Patient alert and oriented x 4, verbally threatening to sue for being held here. Patient did let us  do her vital signs and CBG though irritable and with show of force and encouragement blood draw for labs was completed without an incident. Will continue to monitor for safety.

## 2024-05-01 NOTE — ED Notes (Signed)
 Pt refusing labs, food and drink. Will continue to monitor and encourage food, drink and lab draw.

## 2024-05-01 NOTE — ED Notes (Signed)
 Pt refused fluids and food. Will continue to encourage fluids and food.

## 2024-05-01 NOTE — ED Notes (Signed)
 Pt irritable does not want to answer assessment questions. Continues to state  I want to go home ma'am  Encouraged pt to drink fluids, she refuses.  She declines food or fluids.  States  I do not need anything but to go home.  Attempted to educate pt on the importance of taking meds and nourishment, she became more agitated. Will continue to monitor

## 2024-05-02 ENCOUNTER — Other Ambulatory Visit: Payer: Self-pay

## 2024-05-02 ENCOUNTER — Inpatient Hospital Stay (HOSPITAL_COMMUNITY)
Admission: EM | Admit: 2024-05-02 | Discharge: 2024-05-06 | DRG: 885 | Disposition: A | Discharge status: Other Acute Inpatient Hospital | Source: Other Acute Inpatient Hospital | Attending: Internal Medicine | Admitting: Internal Medicine

## 2024-05-02 ENCOUNTER — Encounter (HOSPITAL_COMMUNITY): Payer: Self-pay

## 2024-05-02 DIAGNOSIS — T730XXA Starvation, initial encounter: Secondary | ICD-10-CM | POA: Diagnosis present

## 2024-05-02 DIAGNOSIS — E46 Unspecified protein-calorie malnutrition: Secondary | ICD-10-CM | POA: Diagnosis not present

## 2024-05-02 DIAGNOSIS — Z91148 Patient's other noncompliance with medication regimen for other reason: Secondary | ICD-10-CM

## 2024-05-02 DIAGNOSIS — E860 Dehydration: Secondary | ICD-10-CM | POA: Diagnosis present

## 2024-05-02 DIAGNOSIS — R9431 Abnormal electrocardiogram [ECG] [EKG]: Secondary | ICD-10-CM | POA: Diagnosis present

## 2024-05-02 DIAGNOSIS — Z681 Body mass index (BMI) 19 or less, adult: Secondary | ICD-10-CM | POA: Diagnosis not present

## 2024-05-02 DIAGNOSIS — F29 Unspecified psychosis not due to a substance or known physiological condition: Secondary | ICD-10-CM | POA: Diagnosis not present

## 2024-05-02 DIAGNOSIS — Z888 Allergy status to other drugs, medicaments and biological substances status: Secondary | ICD-10-CM

## 2024-05-02 DIAGNOSIS — T50916A Underdosing of multiple unspecified drugs, medicaments and biological substances, initial encounter: Secondary | ICD-10-CM | POA: Diagnosis present

## 2024-05-02 DIAGNOSIS — Z79899 Other long term (current) drug therapy: Secondary | ICD-10-CM

## 2024-05-02 DIAGNOSIS — Z5320 Procedure and treatment not carried out because of patient's decision for unspecified reasons: Secondary | ICD-10-CM | POA: Diagnosis present

## 2024-05-02 DIAGNOSIS — Z724 Inappropriate diet and eating habits: Secondary | ICD-10-CM | POA: Diagnosis not present

## 2024-05-02 DIAGNOSIS — R627 Adult failure to thrive: Secondary | ICD-10-CM | POA: Diagnosis present

## 2024-05-02 DIAGNOSIS — M6282 Rhabdomyolysis: Secondary | ICD-10-CM | POA: Diagnosis present

## 2024-05-02 DIAGNOSIS — R64 Cachexia: Secondary | ICD-10-CM | POA: Diagnosis present

## 2024-05-02 DIAGNOSIS — E43 Unspecified severe protein-calorie malnutrition: Secondary | ICD-10-CM | POA: Diagnosis present

## 2024-05-02 DIAGNOSIS — E8729 Other acidosis: Secondary | ICD-10-CM | POA: Diagnosis present

## 2024-05-02 DIAGNOSIS — Z886 Allergy status to analgesic agent status: Secondary | ICD-10-CM | POA: Diagnosis not present

## 2024-05-02 DIAGNOSIS — Z634 Disappearance and death of family member: Secondary | ICD-10-CM | POA: Diagnosis not present

## 2024-05-02 DIAGNOSIS — D638 Anemia in other chronic diseases classified elsewhere: Secondary | ICD-10-CM | POA: Diagnosis present

## 2024-05-02 DIAGNOSIS — F17210 Nicotine dependence, cigarettes, uncomplicated: Secondary | ICD-10-CM | POA: Diagnosis present

## 2024-05-02 DIAGNOSIS — E872 Acidosis, unspecified: Secondary | ICD-10-CM

## 2024-05-02 DIAGNOSIS — X838XXA Intentional self-harm by other specified means, initial encounter: Secondary | ICD-10-CM

## 2024-05-02 DIAGNOSIS — R630 Anorexia: Secondary | ICD-10-CM

## 2024-05-02 DIAGNOSIS — I5030 Unspecified diastolic (congestive) heart failure: Secondary | ICD-10-CM | POA: Diagnosis present

## 2024-05-02 DIAGNOSIS — F200 Paranoid schizophrenia: Secondary | ICD-10-CM | POA: Diagnosis not present

## 2024-05-02 DIAGNOSIS — R001 Bradycardia, unspecified: Secondary | ICD-10-CM | POA: Diagnosis present

## 2024-05-02 DIAGNOSIS — E162 Hypoglycemia, unspecified: Secondary | ICD-10-CM | POA: Diagnosis present

## 2024-05-02 DIAGNOSIS — F209 Schizophrenia, unspecified: Secondary | ICD-10-CM | POA: Diagnosis not present

## 2024-05-02 DIAGNOSIS — D649 Anemia, unspecified: Secondary | ICD-10-CM | POA: Diagnosis not present

## 2024-05-02 LAB — COMPREHENSIVE METABOLIC PANEL WITH GFR
ALT: 15 U/L (ref 0–44)
AST: 25 U/L (ref 15–41)
Albumin: 4.6 g/dL (ref 3.5–5.0)
Alkaline Phosphatase: 87 U/L (ref 38–126)
Anion gap: 22 — ABNORMAL HIGH (ref 5–15)
BUN: 19 mg/dL (ref 8–23)
CO2: 16 mmol/L — ABNORMAL LOW (ref 22–32)
Calcium: 9.5 mg/dL (ref 8.9–10.3)
Chloride: 102 mmol/L (ref 98–111)
Creatinine, Ser: 1.13 mg/dL — ABNORMAL HIGH (ref 0.44–1.00)
GFR, Estimated: 55 mL/min — ABNORMAL LOW (ref >60)
Glucose, Bld: 73 mg/dL (ref 70–99)
Potassium: 3.9 mmol/L (ref 3.5–5.1)
Sodium: 140 mmol/L (ref 135–145)
Total Bilirubin: 1.2 mg/dL (ref 0.0–1.2)
Total Protein: 8.2 g/dL — ABNORMAL HIGH (ref 6.5–8.1)

## 2024-05-02 LAB — BETA-HYDROXYBUTYRIC ACID: Beta-Hydroxybutyric Acid: 4.71 mmol/L — ABNORMAL HIGH (ref 0.05–0.27)

## 2024-05-02 LAB — BLOOD GAS, VENOUS
Acid-Base Excess: 3.8 mmol/L — ABNORMAL HIGH (ref 0.0–2.0)
Bicarbonate: 29.2 mmol/L — ABNORMAL HIGH (ref 20.0–28.0)
O2 Saturation: 84.4 %
Patient temperature: 37
pCO2, Ven: 46 mmHg (ref 44–60)
pH, Ven: 7.41 (ref 7.25–7.43)
pO2, Ven: 50 mmHg — ABNORMAL HIGH (ref 32–45)

## 2024-05-02 LAB — BASIC METABOLIC PANEL WITH GFR
Anion gap: 18 — ABNORMAL HIGH (ref 5–15)
Anion gap: 12 (ref 5–15)
BUN: 15 mg/dL (ref 8–23)
BUN: 12 mg/dL (ref 8–23)
CO2: 18 mmol/L — ABNORMAL LOW (ref 22–32)
CO2: 21 mmol/L — ABNORMAL LOW (ref 22–32)
Calcium: 8.1 mg/dL — ABNORMAL LOW (ref 8.9–10.3)
Calcium: 8.9 mg/dL (ref 8.9–10.3)
Chloride: 104 mmol/L (ref 98–111)
Chloride: 102 mmol/L (ref 98–111)
Creatinine, Ser: 0.8 mg/dL (ref 0.44–1.00)
Creatinine, Ser: 0.86 mg/dL (ref 0.44–1.00)
GFR, Estimated: >60 mL/min (ref >60)
GFR, Estimated: >60 mL/min (ref >60)
Glucose, Bld: 134 mg/dL — ABNORMAL HIGH (ref 70–99)
Glucose, Bld: 119 mg/dL — ABNORMAL HIGH (ref 70–99)
Potassium: 3.5 mmol/L (ref 3.5–5.1)
Potassium: 3.8 mmol/L (ref 3.5–5.1)
Sodium: 140 mmol/L (ref 135–145)
Sodium: 135 mmol/L (ref 135–145)

## 2024-05-02 LAB — PHOSPHORUS: Phosphorus: 4.8 mg/dL — ABNORMAL HIGH (ref 2.5–4.6)

## 2024-05-02 LAB — CBC WITH DIFFERENTIAL/PLATELET
Abs Immature Granulocytes: 0.01 K/uL (ref 0.00–0.07)
Basophils Absolute: 0 K/uL (ref 0.0–0.1)
Basophils Relative: 1 %
Eosinophils Absolute: 0 K/uL (ref 0.0–0.5)
Eosinophils Relative: 0 %
HCT: 51.7 % — ABNORMAL HIGH (ref 36.0–46.0)
Hemoglobin: 16.2 g/dL — ABNORMAL HIGH (ref 12.0–15.0)
Immature Granulocytes: 0 %
Lymphocytes Relative: 25 %
Lymphs Abs: 1.1 K/uL (ref 0.7–4.0)
MCH: 27.1 pg (ref 26.0–34.0)
MCHC: 31.3 g/dL (ref 30.0–36.0)
MCV: 86.5 fL (ref 80.0–100.0)
Monocytes Absolute: 0.3 K/uL (ref 0.1–1.0)
Monocytes Relative: 7 %
Neutro Abs: 3 K/uL (ref 1.7–7.7)
Neutrophils Relative %: 67 %
Platelets: 199 K/uL (ref 150–400)
RBC: 5.98 MIL/uL — ABNORMAL HIGH (ref 3.87–5.11)
RDW: 13.4 % (ref 11.5–15.5)
WBC: 4.4 K/uL (ref 4.0–10.5)
nRBC: 0 % (ref 0.0–0.2)

## 2024-05-02 LAB — CBG MONITORING, ED
Glucose-Capillary: 47 mg/dL — ABNORMAL LOW (ref 70–99)
Glucose-Capillary: 75 mg/dL (ref 70–99)
Glucose-Capillary: 103 mg/dL — ABNORMAL HIGH (ref 70–99)
Glucose-Capillary: 242 mg/dL — ABNORMAL HIGH (ref 70–99)
Glucose-Capillary: 61 mg/dL — ABNORMAL LOW (ref 70–99)
Glucose-Capillary: 100 mg/dL — ABNORMAL HIGH (ref 70–99)

## 2024-05-02 LAB — I-STAT CHEM 8, ED
BUN: 23 mg/dL (ref 8–23)
Calcium, Ion: 1.05 mmol/L — ABNORMAL LOW (ref 1.15–1.40)
Chloride: 107 mmol/L (ref 98–111)
Creatinine, Ser: 1 mg/dL (ref 0.44–1.00)
Glucose, Bld: 74 mg/dL (ref 70–99)
HCT: 53 % — ABNORMAL HIGH (ref 36.0–46.0)
Hemoglobin: 18 g/dL — ABNORMAL HIGH (ref 12.0–15.0)
Potassium: 3.9 mmol/L (ref 3.5–5.1)
Sodium: 140 mmol/L (ref 135–145)
TCO2: 18 mmol/L — ABNORMAL LOW (ref 22–32)

## 2024-05-02 LAB — ETHANOL: Alcohol, Ethyl (B): <15 mg/dL (ref <15)

## 2024-05-02 LAB — GLUCOSE, CAPILLARY
Glucose-Capillary: 76 mg/dL (ref 70–99)
Glucose-Capillary: 60 mg/dL — ABNORMAL LOW (ref 70–99)
Glucose-Capillary: 52 mg/dL — ABNORMAL LOW (ref 70–99)

## 2024-05-02 LAB — HEMOGLOBIN A1C
Hgb A1c MFr Bld: 5.7 % — ABNORMAL HIGH (ref 4.8–5.6)
Mean Plasma Glucose: 116.89 mg/dL

## 2024-05-02 LAB — CK: Total CK: 366 U/L — ABNORMAL HIGH (ref 38–234)

## 2024-05-02 LAB — MAGNESIUM: Magnesium: 2.4 mg/dL (ref 1.7–2.4)

## 2024-05-02 LAB — AMMONIA: Ammonia: 21 umol/L (ref 9–35)

## 2024-05-02 MED ORDER — DEXTROSE 50 % IV SOLN
12.5000 g | Freq: Once | INTRAVENOUS | Status: AC
  Administered 2024-05-02: 12.5 g via INTRAVENOUS
  Filled 2024-05-02: qty 50

## 2024-05-02 MED ORDER — OLANZAPINE 10 MG IM SOLR
10.0000 mg | Freq: Once | INTRAMUSCULAR | Status: AC
  Administered 2024-05-02: 10 mg via INTRAMUSCULAR
  Filled 2024-05-02 (×2): qty 10

## 2024-05-02 MED ORDER — SODIUM CHLORIDE 0.9 % IV BOLUS
500.0000 mL | Freq: Once | INTRAVENOUS | Status: AC
  Administered 2024-05-03: 500 mL via INTRAVENOUS

## 2024-05-02 MED ORDER — DEXTROSE IN LACTATED RINGERS 5 % IV SOLN: INTRAVENOUS | Status: DC

## 2024-05-02 MED ORDER — THIAMINE MONONITRATE 100 MG PO TABS
100.0000 mg | ORAL_TABLET | Freq: Every day | ORAL | Status: DC
  Administered 2024-05-03: 100 mg via ORAL
  Filled 2024-05-02: qty 1

## 2024-05-02 MED ORDER — THIAMINE HCL 100 MG/ML IJ SOLN
500.0000 mg | Freq: Every day | INTRAVENOUS | Status: AC
Start: 2024-05-02 — End: 2024-05-05
  Administered 2024-05-02 – 2024-05-04 (×2): 500 mg via INTRAVENOUS
  Filled 2024-05-02 (×4): qty 5

## 2024-05-02 MED ORDER — DEXTROSE-SODIUM CHLORIDE 5-0.45 % IV SOLN
INTRAVENOUS | Status: AC
  Filled 2024-05-02: qty 1000

## 2024-05-02 MED ORDER — DEXTROSE 10 % IV SOLN: INTRAVENOUS | Status: DC

## 2024-05-02 MED ORDER — GLUCAGON HCL RDNA (DIAGNOSTIC) 1 MG IJ SOLR
1.0000 mg | Freq: Once | INTRAMUSCULAR | Status: AC
  Administered 2024-05-02: 1 mg via INTRAMUSCULAR
  Filled 2024-05-02: qty 1

## 2024-05-02 MED ORDER — DEXTROSE 50 % IV SOLN: 25.0000 g | Freq: Once | INTRAVENOUS | Status: DC

## 2024-05-02 MED ORDER — TRAZODONE HCL 50 MG PO TABS
50.0000 mg | ORAL_TABLET | Freq: Every day | ORAL | Status: DC
  Administered 2024-05-03 – 2024-05-05 (×3): 50 mg via ORAL
  Filled 2024-05-02 (×4): qty 1

## 2024-05-02 MED ORDER — OLANZAPINE 5 MG PO TBDP: 5.0000 mg | ORAL_TABLET | Freq: Two times a day (BID) | ORAL | Status: DC

## 2024-05-02 MED ORDER — DEXTROSE 50 % IV SOLN
1.0000 | Freq: Once | INTRAVENOUS | Status: AC
Start: 2024-05-02 — End: 2024-05-02
  Administered 2024-05-02: 50 mL via INTRAVENOUS
  Filled 2024-05-02: qty 50

## 2024-05-02 MED ORDER — OLANZAPINE 5 MG PO TABS
5.0000 mg | ORAL_TABLET | Freq: Two times a day (BID) | ORAL | Status: DC
Start: 2024-05-02 — End: 2024-05-04
  Administered 2024-05-03 – 2024-05-04 (×3): 5 mg via ORAL
  Filled 2024-05-02 (×4): qty 1

## 2024-05-02 MED ORDER — LACTATED RINGERS IV BOLUS
1000.0000 mL | Freq: Once | INTRAVENOUS | Status: AC
  Administered 2024-05-02: 1000 mL via INTRAVENOUS

## 2024-05-02 NOTE — ED Provider Notes (Signed)
 History of Present illness: [name] is 63 y/o female with a psychiatric history of paranoid schizophrenia presented to [facility] brought in [employer] under IVC with complaints of aggressive behaviors towards her mother.    On assessment today, patient remains illogical, tangential, irritable, on approach, refusing to engage in assessment. Wants to be left alone. Continuing to present with paranoia, and continues to meet criteria for involuntary commitment for treatment of  current thought d/o.  Patient is irrational, presents with poor insight regarding her need for treatment with recurrent thought disorder, persistently demanding to be discharged, but is not stable yet for discharge to the community.  She is refusing to eat, refusing to drink fluids, CBG earlier today morning were 60 earlier in the morning after which nursing staff encouraged patient to eat and drink, and she refused.  Multiple positive reinforcements were given for patient's to eat and drink, and writer also spent some time with her encouraging same, and was made with agitation.  CBG was repeated and was 52.  Due to the possibility of patient going into a hypoglycemic crisis, we made the decision to transfer patient to the ER for the higher level of care where she will benefit from getting IV fluids to prevent this from happening.  Writer spoke with Dr. Mannie, who was agreeable to taking patient under his care in the [facility], ER for medical care since we do not offer those services at the [facility].  Patient can be followed by psychiatry who is present at the [facility] ER.  Recommendations at this time remains inpatient for treatment and stabilization of mental status.    Patient has not been receptive to p.o. medications thus far either.  At the [facility], forced medication orders are unable to be enforced. Only agitation protocols can be given as needed.  Patient will benefit  from being in an inpatient unit where two-physician forced medication order can be pursued, and given for betterment of her mental health.

## 2024-05-02 NOTE — Assessment & Plan Note (Addendum)
 Starvation ketoacidosis Changed to D5 half-normal infusion and monitor blood sugar every 4 hours BMET

## 2024-05-02 NOTE — ED Notes (Signed)
 Report given to [facility] CN. [facility] called for transport of pt to ED.

## 2024-05-02 NOTE — Assessment & Plan Note (Signed)
 Appreciate behavioral health psychiatric consult.  Continue Zyprexa  for now

## 2024-05-02 NOTE — ED Notes (Signed)
 Pt not agreeable to an IV at this time. Security at bedside to hold pt's hands during IM injections.

## 2024-05-02 NOTE — ED Notes (Signed)
 Unable to assess [facility] d/t pt refusing to answer orientation questions and responds just get me out of here. MD notified pt refuses EKG and notified of HR 45-55. Pt offered food and drink and refuses.

## 2024-05-02 NOTE — Assessment & Plan Note (Signed)
 Will need to have further discussion with psychiatry regarding patient's treatment and goals of care patient currently IVC

## 2024-05-02 NOTE — ED Notes (Addendum)
 CBG 52 per glucometer. Notified NP. Encouraging pt to drink OJ that was provided and to eat something. Pt refusing still.

## 2024-05-02 NOTE — Assessment & Plan Note (Signed)
 Appreciate nutritional consult

## 2024-05-02 NOTE — Assessment & Plan Note (Signed)
 Obtain anemia panel  Transfuse for Hg <7 , rapidly dropping or  if symptomatic

## 2024-05-02 NOTE — Assessment & Plan Note (Addendum)
 Will rehydrate, appreciate psychiatry consult Nutritional consult check electrolytes Administer thiamine 

## 2024-05-02 NOTE — ED Notes (Signed)
 Pt A&O, irritable when asking assessment questions. Denies SI/HI/AVH states, That's not what I'm here for.

## 2024-05-02 NOTE — ED Notes (Signed)
 Pt refusing breakfast. Water  and nutrigrain bars left for pt to drink and eat if she changes her mind. Will continue to monitor.

## 2024-05-02 NOTE — ED Notes (Signed)
 Pt's BG 47. Pt refusing food/juice.

## 2024-05-02 NOTE — ED Notes (Signed)
 Patient refused EKG. RN made aware.

## 2024-05-02 NOTE — ED Notes (Signed)
 Chief Complaint  Patient presents with   Hypoglycemia   Past Medical History:  Diagnosis Date   Anemia 10/02/2021   Non compliance w medication regimen    Schizophrenia (HCC)     BP 125/62 (BP Location: Right Arm)   Pulse (!) 53   Temp 97.8 F (36.6 C) (Oral)   Resp 15   SpO2 100%    Pt is refusing all treatment and medication   Gema Ringold, Ozell Journey

## 2024-05-02 NOTE — ED Notes (Addendum)
 Pt transferred to EMS stretcher w/o incident. IVC papers, and other appropriate papers and belongings given to transport.

## 2024-05-02 NOTE — ED Provider Notes (Signed)
 Received patient in turnover from Dr. Francesca.  Please see their note for further details of Hx, PE.  Briefly patient is a 63 y.o. female with a Hypoglycemia .  Recheck bmp.  Some delay of the patient's BMP repeated.  Blood sugar has again trended downward in the 60s.  Will give another bolus of D50.  Started on D10 infusion.  With prolonged hypoglycemia likely needs admission.  Will discuss with medicine.  CRITICAL CARE Performed by: Toribio Belvie Quale   Total critical care time: 35 minutes  Critical care time was exclusive of separately billable procedures and treating other patients.  Critical care was necessary to treat or prevent imminent or life-threatening deterioration.  Critical care was time spent personally by me on the following activities: development of treatment plan with patient and/or surrogate as well as nursing, discussions with consultants, evaluation of patient's response to treatment, examination of patient, obtaining history from patient or surrogate, ordering and performing treatments and interventions, ordering and review of laboratory studies, ordering and review of radiographic studies, pulse oximetry and re-evaluation of patient's condition.      Quale Share, DO 05/02/24 2032

## 2024-05-02 NOTE — Subjective & Objective (Signed)
 Pt was in psychiatric facility refusing to eat and drink her BG was low so she was sent to ER  In ER she got d5 and glucagon   She is still hypoglycemic She is usually gets care at [facility] She has hx of schizophrenia

## 2024-05-02 NOTE — ED Triage Notes (Addendum)
 Pt to ED via EMS from [facility] with c/o hypoglycemia. Pt IVC'd at [facility]. BS 57 for EMS. Per EMS, pt has not been eating or drinking for several days. Pt awake and alert. Hx schizophrenia.

## 2024-05-02 NOTE — ED Notes (Addendum)
 CBG per glucometer resulted as 60 mg/dL at 9150. Notified provider. Will encourage pt to drink OJ and food intake.

## 2024-05-02 NOTE — ED Notes (Signed)
 Patient refused their EKG@ 7:41pm.

## 2024-05-02 NOTE — ED Provider Notes (Signed)
 [place] EMERGENCY DEPARTMENT AT [facility] Provider Note  CSN: [id number] Arrival date & time: 05/02/24 1056  Chief Complaint(s) Hypoglycemia  HPI [name] is a 63 y.o. female history of schizophrenia presenting to the emergency department with hypoglycemia.  Patient was taken to behavioral health urgent care and was placed on IVC for disorganized behavior, aggressive behavior towards mother.  She arrived yesterday.  Has been refusing to eat or drink, today found that patient had low blood sugar and sent to ER given inability to medically clear patient.  She reports that nothing is wrong with me, denies pain anywhere, reports I don't have any problem with my blood sugar.  When asked to eat patient refuses.  History is limited due to patient refusal to answer questions/psychiatric disorder   Past Medical History Past Medical History:  Diagnosis Date   Anemia 10/02/2021   Non compliance w medication regimen    Schizophrenia [facility])    Patient Active Problem List   Diagnosis Date Noted   Psychosis (HCC) 01/20/2023   Schizoaffective disorder, depressive type (HCC) 04/08/2022   Ventricular tachycardia (HCC) 10/13/2021   Sinus bradycardia 10/03/2021   Anemia of chronic disease 10/02/2021   Hypomagnesemia 09/22/2021   Hypokalemia 09/22/2021   Hypophosphatemia 09/21/2021   Hyponatremia 09/20/2021   Metabolic acidosis 09/16/2021   Protein-calorie malnutrition, severe 03/27/2020   Hypotension 03/26/2020   Anorexia 03/26/2020   Hypokalemia, hypomagnesemia and hypophosphatemia 03/26/2020   Hypocalcemia 03/26/2020   Fall at home, initial encounter 03/26/2020   Recurrent hypoglycemia and severe malnutrition 03/14/2020   Paranoid schizophrenia (HCC) 03/01/2020   Noncompliance with diet and medication regimen    Psychoses (HCC)    Tobacco abuse 03/12/2014   Hypokalemic alkalosis 11/11/2011   Medically noncompliant 11/11/2011   Home Medication(s) Prior to Admission  medications   Not on File                                                                                                                                    Past Surgical History Past Surgical History:  Procedure Laterality Date   BIOPSY  09/25/2021   Procedure: BIOPSY;  Surgeon: Teressa Toribio SQUIBB, MD;  Location: [facility];  Service: Gastroenterology;;   ESOPHAGOGASTRODUODENOSCOPY (EGD) WITH PROPOFOL  [name] 09/25/2021   Procedure: ESOPHAGOGASTRODUODENOSCOPY (EGD) WITH PROPOFOL ;  Surgeon: Teressa Toribio SQUIBB, MD;  Location: [facility];  Service: Gastroenterology;  Laterality: [name];  With NGT placement   Family History History reviewed. No pertinent family history.  Social History Social History   Tobacco Use   Smoking status: Every Day    Current packs/day: 2.00    Types: Cigarettes    Passive exposure: Current   Smokeless tobacco: Never  Vaping Use   Vaping status: Never Used  Substance Use Topics   Alcohol use: Not Currently    Comment: Refuses to disclose how much   Drug use: No    Comment: Refuses to answer  Allergies Aspirin and Ziprasidone hcl  Review of Systems Review of Systems  Unable to perform ROS: Psychiatric disorder    Physical Exam Vital Signs  I have reviewed the triage vital signs BP (!) 145/133 (BP Location: Right Arm)   Pulse (!) 56   Temp 97.8 F (36.6 C) (Oral)   Resp 16   SpO2 100%  Physical Exam Vitals and nursing note reviewed.  Constitutional:      General: She is not in acute distress.    Appearance: She is well-developed.  HENT:     Head: Normocephalic and atraumatic.     Mouth/Throat:     Mouth: Mucous membranes are dry.  Eyes:     Pupils: Pupils are equal, round, and reactive to light.  Cardiovascular:     Rate and Rhythm: Normal rate and regular rhythm.     Heart sounds: No murmur heard. Pulmonary:     Effort: Pulmonary effort is normal. No respiratory distress.     Breath sounds: Normal breath sounds.  Abdominal:     General:  Abdomen is flat.     Palpations: Abdomen is soft.     Tenderness: There is no abdominal tenderness.  Musculoskeletal:        General: No tenderness.     Right lower leg: No edema.     Left lower leg: No edema.  Skin:    General: Skin is warm and dry.  Neurological:     General: No focal deficit present.     Mental Status: She is alert. Mental status is at baseline.  Psychiatric:        Mood and Affect: Affect is angry.        Behavior: Behavior is uncooperative and agitated.     ED Results and Treatments Labs (all labs ordered are listed, but only abnormal results are displayed) Labs Reviewed  COMPREHENSIVE METABOLIC PANEL WITH GFR - Abnormal; Notable for the following components:      Result Value   CO2 16 (*)    Creatinine, Ser 1.13 (*)    Total Protein 8.2 (*)    GFR, Estimated 55 (*)    Anion gap 22 (*)    All other components within normal limits  PHOSPHORUS - Abnormal; Notable for the following components:   Phosphorus 4.8 (*)    All other components within normal limits  CBG MONITORING, ED - Abnormal; Notable for the following components:   Glucose-Capillary 47 (*)    All other components within normal limits  I-STAT CHEM 8, ED - Abnormal; Notable for the following components:   Calcium , Ion 1.05 (*)    TCO2 18 (*)    Hemoglobin 18.0 (*)    HCT 53.0 (*)    All other components within normal limits  CBG MONITORING, ED - Abnormal; Notable for the following components:   Glucose-Capillary 103 (*)    All other components within normal limits  ETHANOL  MAGNESIUM   RAPID URINE DRUG SCREEN, HOSP PERFORMED  CBC WITH DIFFERENTIAL/PLATELET  URINALYSIS, ROUTINE W REFLEX MICROSCOPIC  CBC WITH DIFFERENTIAL/PLATELET  BETA-HYDROXYBUTYRIC ACID  BASIC METABOLIC PANEL WITH GFR  CBG MONITORING, ED  Radiology No results found.  Pertinent labs & imaging  results that were available during my care of the patient were reviewed by me and considered in my medical decision making (see MDM for details).  Medications Ordered in ED Medications  dextrose  5 % in lactated ringers  infusion ( Intravenous New Bag/Given 05/02/24 1402)  OLANZapine  (ZYPREXA ) injection 10 mg (10 mg Intramuscular Given 05/02/24 1137)  lactated ringers  bolus 1,000 mL (0 mLs Intravenous Stopped 05/02/24 1350)  glucagon  (human recombinant) (GLUCAGEN ) injection 1 mg (1 mg Intramuscular Given 05/02/24 1137)  dextrose  50 % solution 12.5 g (12.5 g Intravenous Given 05/02/24 1234)                                                                                                                                     Procedures Procedures  (including critical care time)  Medical Decision Making / ED Course   MDM:  63 year old patient presenting to the emergency department with hypoglycemia.  Blood glucose here is 47.  Suspect this is likely from the patient not eating.  Here she is also refusing food.  She does not have medical capacity to refuse medical care.  She is refusing to eat or drink.  She is refusing IV placement so required IM medication.  Will give glucagon  as well.  Will check metabolic workup, give IV dextrose , rehydrate patient and reassess.  Clinical Course as of 05/02/24 1451  Tue May 02, 2024  1447 Blood sugar has improved. Given anion gap will check repeat BMP to ensure improvement after glucose and fluids. If improving think patient can be psychiatrically cleared. Signed out to oncoming MD pending this.  [WS]    Clinical Course User Index [WS] Francesca Elsie CROME, MD     Additional history obtained: -Additional history obtained from ems -External records from outside source obtained and reviewed including: Chart review including previous notes, labs, imaging, consultation notes including [facility] notes    Lab Tests: -I ordered, reviewed, and interpreted labs.    The pertinent results include:   Labs Reviewed  COMPREHENSIVE METABOLIC PANEL WITH GFR - Abnormal; Notable for the following components:      Result Value   CO2 16 (*)    Creatinine, Ser 1.13 (*)    Total Protein 8.2 (*)    GFR, Estimated 55 (*)    Anion gap 22 (*)    All other components within normal limits  PHOSPHORUS - Abnormal; Notable for the following components:   Phosphorus 4.8 (*)    All other components within normal limits  CBG MONITORING, ED - Abnormal; Notable for the following components:   Glucose-Capillary 47 (*)    All other components within normal limits  I-STAT CHEM 8, ED - Abnormal; Notable for the following components:   Calcium , Ion 1.05 (*)    TCO2 18 (*)    Hemoglobin 18.0 (*)    HCT 53.0 (*)  All other components within normal limits  CBG MONITORING, ED - Abnormal; Notable for the following components:   Glucose-Capillary 103 (*)    All other components within normal limits  ETHANOL  MAGNESIUM   RAPID URINE DRUG SCREEN, HOSP PERFORMED  CBC WITH DIFFERENTIAL/PLATELET  URINALYSIS, ROUTINE W REFLEX MICROSCOPIC  CBC WITH DIFFERENTIAL/PLATELET  BETA-HYDROXYBUTYRIC ACID  BASIC METABOLIC PANEL WITH GFR  CBG MONITORING, ED    Notable for mild acidosis, dehydration, anion gap likely due to starvation ketosis    Medicines ordered and prescription drug management: Meds ordered this encounter  Medications   DISCONTD: dextrose  50 % solution 25 g   OLANZapine  (ZYPREXA ) injection 10 mg   lactated ringers  bolus 1,000 mL   glucagon  (human recombinant) (GLUCAGEN ) injection 1 mg   dextrose  50 % solution 12.5 g   dextrose  5 % in lactated ringers  infusion    -I have reviewed the patients home medicines and have made adjustments as needed   Reevaluation: After the interventions noted above, I reevaluated the patient and found that their symptoms have improved  Co morbidities that complicate the patient evaluation  Past Medical History:  Diagnosis Date    Anemia 10/02/2021   Non compliance w medication regimen    Schizophrenia (HCC)       Dispostion: Disposition decision including need for hospitalization was considered, and patient disposition pending at time of sign out.    Final Clinical Impression(s) / ED Diagnoses Final diagnoses:  Dehydration  Hypoglycemia  Inappropriate diet or eating habits  Psychosis, unspecified psychosis type (HCC)     This chart was dictated using voice recognition software.  Despite best efforts to proofread,  errors can occur which can change the documentation meaning.    Francesca Elsie CROME, MD 05/02/24 1451

## 2024-05-02 NOTE — H&P (Signed)
 [name] FMW:[id number] DOB: 11-19-1960 DOA: 05/02/2024     PCP: [facility]     Patient arrived to ER on 05/02/24 at 1056 Referred by Attending Emil Share, DO   Patient coming from:   Psychiatric facility    Chief Complaint:   Chief Complaint  Patient presents with   Hypoglycemia    HPI: [name] is a 63 y.o. female with medical history significant of paranoid schizophrenia    Presented with refusal eat and drink Pt was in psychiatric facility refusing to eat and drink her BG was low so she was sent to ER  In ER she got d5 and glucagon   She is still hypoglycemic She is usually gets care at [facility] She has hx of schizophrenia    Patient have had a history of hospitalization secondary to the same Patient has undergone G-tube placement in March 2023 after ethics committee decision She went to [facility] and undergone ECT but had complications with V. Tach Which was followed secondary to adrenergic surge She was readmitted again in 2024 secondary to bradycardia and hypokalemia At that time she was discharged home on Depakote  253 times a day midodrine  5 mg 3 times a day potassium supplements and thiamine .  According to notes patient was brought in on 812 as IVC because of aggressive behaviors towards her mother has been actively hallucinating became extremely aggressive towards a 12 year old mother. Patient's prior guardian who was her sister passed away her brother was trying to be in the process of receiving guardianship but has not obtained it yet    Regarding pertinent Chronic problems:      chronic CHF diastolic/ - last echo  Recent Results (from the past 56199 hours)  ECHOCARDIOGRAM COMPLETE   Collection Time: 10/14/21 12:34 PM  Result Value   Weight 1,576.73   Height 64   BP 88/62   Ao pk vel 1.30   AV Area VTI 2.42   AR max vel 2.75   AV Mean grad 4.0   AV Peak grad 6.8   AV Area mean vel 2.29   Area-P 1/2 4.17   MV VTI 2.95    Narrative      ECHOCARDIOGRAM REPORT        1. Left ventricular ejection fraction, by estimation, is 55 to 60%. The left ventricle has normal function. The left ventricle has no regional wall motion abnormalities. Left ventricular diastolic parameters are consistent with Grade I diastolic  dysfunction (impaired relaxation).  2. Right ventricular systolic function is normal. The right ventricular size is normal.  3. The mitral valve is normal in structure. No evidence of mitral valve regurgitation. No evidence of mitral stenosis.  4. The aortic valve is normal in structure. Aortic valve regurgitation is not visualized. Aortic valve sclerosis is present, with no evidence of aortic valve stenosis.  5. The inferior vena cava is normal in size with greater than 50% respiratory variability, suggesting right atrial pressure of 3 mmHg.          While in ER: Clinical Course as of 05/02/24 2013  Tue May 02, 2024  1447 Blood sugar has improved. Given anion gap will check repeat BMP to ensure improvement after glucose and fluids. If improving think patient can be psychiatrically cleared. Signed out to oncoming MD pending this.  [WS]    Clinical Course User Index [WS] Francesca Elsie CROME, MD         Lab Orders  Comprehensive metabolic panel         Ethanol         Urine rapid drug screen (hosp performed)         CBC with Diff         Urinalysis, Routine w reflex microscopic -Urine, Clean Catch         Phosphorus         Magnesium          CBC with Differential/Platelet         Beta-hydroxybutyric acid         Basic metabolic panel         CBG monitoring, ED         I-Stat Chem 8, ED         CBG monitoring, ED         POC CBG, ED         CBG monitoring, ED         CBG monitoring, ED      Following Medications were ordered in ER: Medications  dextrose  10 % infusion ( Intravenous New Bag/Given 05/02/24 2006)  OLANZapine  (ZYPREXA ) injection 10 mg (10 mg Intramuscular Given  05/02/24 1137)  lactated ringers  bolus 1,000 mL (0 mLs Intravenous Stopped 05/02/24 1350)  glucagon  (human recombinant) (GLUCAGEN ) injection 1 mg (1 mg Intramuscular Given 05/02/24 1137)  dextrose  50 % solution 12.5 g (12.5 g Intravenous Given 05/02/24 1234)  dextrose  50 % solution 50 mL (50 mLs Intravenous Given 05/02/24 2000)       ED Triage Vitals [05/02/24 1109]  Encounter Vitals Group     BP (!) 145/133     Girls Systolic BP Percentile      Girls Diastolic BP Percentile      Boys Systolic BP Percentile      Boys Diastolic BP Percentile      Pulse Rate (!) 56     Resp 16     Temp 97.8 F (36.6 C)     Temp Source Oral     SpO2 100 %     Weight      Height      Head Circumference      Peak Flow      Pain Score      Pain Loc      Pain Education      Exclude from Growth Chart   TMAX(24)@     _________________________________________ Significant initial  Findings: Abnormal Labs Reviewed  COMPREHENSIVE METABOLIC PANEL WITH GFR - Abnormal; Notable for the following components:      Result Value   CO2 16 (*)    Creatinine, Ser 1.13 (*)    Total Protein 8.2 (*)    GFR, Estimated 55 (*)    Anion gap 22 (*)    All other components within normal limits  PHOSPHORUS - Abnormal; Notable for the following components:   Phosphorus 4.8 (*)    All other components within normal limits  CBC WITH DIFFERENTIAL/PLATELET - Abnormal; Notable for the following components:   RBC 5.98 (*)    Hemoglobin 16.2 (*)    HCT 51.7 (*)    All other components within normal limits  BETA-HYDROXYBUTYRIC ACID - Abnormal; Notable for the following components:   Beta-Hydroxybutyric Acid 4.71 (*)    All other components within normal limits  BASIC METABOLIC PANEL WITH GFR - Abnormal; Notable for the following components:   CO2 18 (*)    Glucose, Bld 134 (*)    Calcium  8.1 (*)  Anion gap 18 (*)    All other components within normal limits  CBG MONITORING, ED - Abnormal; Notable for the following  components:   Glucose-Capillary 47 (*)    All other components within normal limits  I-STAT CHEM 8, ED - Abnormal; Notable for the following components:   Calcium , Ion 1.05 (*)    TCO2 18 (*)    Hemoglobin 18.0 (*)    HCT 53.0 (*)    All other components within normal limits  CBG MONITORING, ED - Abnormal; Notable for the following components:   Glucose-Capillary 103 (*)    All other components within normal limits  CBG MONITORING, ED - Abnormal; Notable for the following components:   Glucose-Capillary 61 (*)    All other components within normal limits         ECG: Ordered Personally reviewed and interpreted by me showing: HR : 58 Rhythm:Sinus rhythm RSR' in V1 or V2, probably normal variant Abnormal T, consider ischemia, diffuse leads ST elevation, consider inferior injury Artifact in lead(s) I II III aVR aVL aVF V1 V2 V3 V5 V6 QTC 483   The recent clinical data is shown below. Vitals:   05/02/24 1109 05/02/24 1939  BP: (!) 145/133 125/62  Pulse: (!) 56 (!) 53  Resp: 16 15  Temp: 97.8 F (36.6 C)   TempSrc: Oral   SpO2: 100% 100%    WBC     Component Value Date/Time   WBC 4.4 05/02/2024 1100   LYMPHSABS 1.1 05/02/2024 1100   MONOABS 0.3 05/02/2024 1100   EOSABS 0.0 05/02/2024 1100   BASOSABS 0.0 05/02/2024 1100          UA   no evidence of UTI  Urine analysis:    Component Value Date/Time   COLORURINE YELLOW 04/30/2024 1221   APPEARANCEUR CLEAR 04/30/2024 1221   LABSPEC 1.014 04/30/2024 1221   PHURINE 5.0 04/30/2024 1221   GLUCOSEU NEGATIVE 04/30/2024 1221   HGBUR SMALL (A) 04/30/2024 1221   BILIRUBINUR NEGATIVE 04/30/2024 1221   KETONESUR 5 (A) 04/30/2024 1221   PROTEINUR NEGATIVE 04/30/2024 1221   UROBILINOGEN 0.2 04/10/2015 1111   NITRITE NEGATIVE 04/30/2024 1221   LEUKOCYTESUR NEGATIVE 04/30/2024 1221    Results for orders placed or performed during the hospital encounter of 04/30/24  SARS Coronavirus 2 by RT PCR (hospital order,  performed in [facility] hospital lab) *cepheid single result test* Anterior Nasal Swab     Status: None   Collection Time: 04/30/24 10:04 AM   Specimen: Anterior Nasal Swab  Result Value Ref Range Status   SARS Coronavirus 2 by RT PCR NEGATIVE NEGATIVE Final    Comment: Performed at [facility], [address] [place], [place] [address]   Vbg pending __________________________________________________________ Recent Labs  Lab 05/01/24 2011 05/02/24 1215 05/02/24 1304 05/02/24 1447  NA 140 140 140 140  K 3.5 3.9 3.9 3.5  CO2 23 16*  --  18*  GLUCOSE 65* 73 74 134*  BUN 12 19 23 15   CREATININE 0.92 1.13* 1.00 0.80  CALCIUM  9.3 9.5  --  8.1*  MG  --  2.4  --   --   PHOS  --  4.8*  --   --     Cr    stable,    Lab Results  Component Value Date   CREATININE 0.80 05/02/2024   CREATININE 1.00 05/02/2024   CREATININE 1.13 (H) 05/02/2024    Recent Labs  Lab 05/01/24 2011 05/02/24 1215  AST 24 25  ALT 16 15  ALKPHOS 78 87  BILITOT 1.1 1.2  PROT 7.6 8.2*  ALBUMIN 4.3 4.6   Lab Results  Component Value Date   CALCIUM  8.1 (L) 05/02/2024   PHOS 4.8 (H) 05/02/2024    Plt: Lab Results  Component Value Date   PLT 199 05/02/2024       Recent Labs  Lab 05/01/24 2011 05/02/24 1100 05/02/24 1304  WBC 6.5 4.4  --   NEUTROABS 4.0 3.0  --   HGB 15.2* 16.2* 18.0*  HCT 47.1* 51.7* 53.0*  MCV 84.1 86.5  --   PLT 245 199  --     HG/HCT   stable,       Component Value Date/Time   HGB 18.0 (H) 05/02/2024 1304   HCT 53.0 (H) 05/02/2024 1304   MCV 86.5 05/02/2024 1100     _______________________________________________ Hospitalist was called for admission for   Dehydration   Hypoglycemia   Psychosis, unspecified psychosis type (HCC)     The following Work up has been ordered so far:  Orders Placed This Encounter  Procedures   Comprehensive metabolic panel   Ethanol   Urine rapid drug screen (hosp performed)   CBC with Diff   Urinalysis, Routine w  reflex microscopic -Urine, Clean Catch   Phosphorus   Magnesium    CBC with Differential/Platelet   Beta-hydroxybutyric acid   Basic metabolic panel   Initiate Carrier Fluid Protocol   Consult for Unassigned Medical Admission   CBG monitoring, ED   I-Stat Chem 8, ED   CBG monitoring, ED   POC CBG, ED   CBG monitoring, ED   CBG monitoring, ED   ED EKG   Involuntary Commitment with 1:1 Continuous Monitoring     OTHER Significant initial  Findings:  labs showing:     DM  labs:  HbA1C: No results for input(s): HGBA1C in the last 8760 hours.     CBG (last 3)  Recent Labs    05/02/24 1218 05/02/24 1426 05/02/24 1939  GLUCAP 75 103* 61*          Cultures:    Component Value Date/Time   SDES  03/25/2020 1608    URINE, CLEAN CATCH Performed at [facility], [address], [place], [place] [address]    SPECREQUEST  03/25/2020 1608    NONE Performed at [facility], [address], [place], [place] [address]    CULT (A) 03/25/2020 1608    40,000 COLONIES/mL GROUP B STREP(S.AGALACTIAE)ISOLATED TESTING AGAINST S. AGALACTIAE NOT ROUTINELY PERFORMED DUE TO PREDICTABILITY OF AMP/PEN/VAN SUSCEPTIBILITY. Performed at [facility], [address], [place], [place] [address]    REPTSTATUS 03/26/2020 FINAL 03/25/2020 1608     Radiological Exams on Admission: No results found. _______________________________________________________________________________________________________ Latest  Blood pressure 125/62, pulse (!) 53, temperature 97.8 F (36.6 C), temperature source Oral, resp. rate 15, SpO2 100%.   Vitals  labs and radiology finding personally reviewed  Review of Systems:    Pertinent positives include: refused to eat  Constitutional:  No weight loss, night sweats, Fevers, chills, fatigue, weight loss  HEENT:  No headaches, Difficulty swallowing,Tooth/dental problems,Sore throat,  No sneezing, itching, ear ache,  nasal congestion, post nasal drip,  Cardio-vascular:  No chest pain, Orthopnea, PND, anasarca, dizziness, palpitations.no Bilateral lower extremity swelling  GI:  No heartburn, indigestion, abdominal pain, nausea, vomiting, diarrhea, change in bowel habits, loss of appetite, melena, blood in stool, hematemesis Resp:  no  shortness of breath at rest. No dyspnea on exertion, No excess mucus, no productive cough, No non-productive cough, No coughing up of blood.No change in color of mucus.No wheezing. Skin:  no rash or lesions. No jaundice GU:  no dysuria, change in color of urine, no urgency or frequency. No straining to urinate.  No flank pain.  Musculoskeletal:  No joint pain or no joint swelling. No decreased range of motion. No back pain.  Psych:  No change in mood or affect. No depression or anxiety. No memory loss.  Neuro: no localizing neurological complaints, no tingling, no weakness, no double vision, no gait abnormality, no slurred speech, no confusion  All systems reviewed and apart from HOPI all are negative _______________________________________________________________________________________________ Past Medical History:   Past Medical History:  Diagnosis Date   Anemia 10/02/2021   Non compliance w medication regimen    Schizophrenia [facility])       Past Surgical History:  Procedure Laterality Date   BIOPSY  09/25/2021   Procedure: BIOPSY;  Surgeon: Teressa Toribio SQUIBB, MD;  Location: [facility];  Service: Gastroenterology;;   ESOPHAGOGASTRODUODENOSCOPY (EGD) WITH PROPOFOL  [name] 09/25/2021   Procedure: ESOPHAGOGASTRODUODENOSCOPY (EGD) WITH PROPOFOL ;  Surgeon: Teressa Toribio SQUIBB, MD;  Location: [facility];  Service: Gastroenterology;  Laterality: [name];  With NGT placement    Social History:  Ambulatory   independently      reports that she has been smoking cigarettes. She has been exposed to tobacco smoke. She has never used smokeless tobacco. She reports that she does not  currently use alcohol. She reports that she does not use drugs.   Family History:   History reviewed. No pertinent family history. ______________________________________________________________________________________________ Allergies: Allergies  Allergen Reactions   Aspirin Hives, Nausea Only and Other (See Comments)    Possibly caused hives, per sister and caused STOMACH UPSET   Ziprasidone Hcl Hives, Rash and Other (See Comments)    GEODON = Caused twitching and redness of eyes (also)     Prior to Admission medications   Not on File    ___________________________________________________________________________________________________ Physical Exam:    05/02/2024    7:39 PM 05/02/2024   11:09 AM 05/02/2024   10:20 AM  Vitals with BMI  Systolic 125 145 894  Diastolic 62 133 68  Pulse 53 56 62    1. General:  in No  Acute distress but agitated refused to provide history    Chronically ill  -appearing 2. Psychological: Alert and   Oriented 3. Head/ENT:  Dry Mucous Membranes                          Head Non traumatic, neck supple                         Poor Dentition 4. SKIN:   decreased Skin turgor,  Skin clean Dry and intact no rash    5. Heart: Regular rate and rhythm no  Murmur, no Rub or gallop 6. Lungs:  , no wheezes or crackles   7. Abdomen: Soft,  non-tender, Non distended   bowel sounds present 8. Lower extremities: no clubbing, cyanosis, no  edema 9. Neurologically Grossly intact, moving all 4 extremities equally   10. MSK: Normal range of motion    Chart has been reviewed  ______________________________________________________________________________________________  Assessment/Plan 63 y.o. female with medical history significant of paranoid schizophrenia  Admitted for   Dehydration   Hypoglycemia   Psychosis, unspecified  psychosis type        Present on Admission:  Recurrent hypoglycemia and severe malnutrition  Metabolic acidosis   Anemia of chronic disease  Anorexia  Protein-calorie malnutrition, severe  Schizophrenia, paranoid (HCC)    Recurrent hypoglycemia and severe malnutrition Will rehydrate, appreciate psychiatry consult Nutritional consult check electrolytes Administer thiamine      Metabolic acidosis Starvation ketoacidosis Changed to D5 half-normal infusion and monitor blood sugar every 4 hours BMET  Anemia of chronic disease Obtain anemia panel  Transfuse for Hg <7 , rapidly dropping or  if symptomatic  Protein-calorie malnutrition, severe Appreciate nutritional consult  Schizophrenia, paranoid (HCC) Appreciate behavioral health psychiatric consult.  Continue Zyprexa  for now  Anorexia Will need to have further discussion with psychiatry regarding patient's treatment and goals of care patient currently IVC    Other plan as per orders.  DVT prophylaxis:  SCD      Code Status:    Code Status: Prior FULL CODE pt currently lacks the capacity    Family Communication:   Family not at  Bedside    Diet regular   Disposition Plan:                           Back to current facility when stable                           Following barriers for discharge:                                                         Electrolytes corrected                                                         Will need consultants to evaluate patient prior to discharge                             Would benefit from PT/OT eval prior to DC  Ordered                                       Nutrition    consulted                                      Behavioral health  consulted                    Consults called: Psychiatry   Admission status:  ED Disposition     ED Disposition  Admit   Condition  --   Comment  Hospital [facility]: [facility] [[id number]]  Level of Care: Progressive [102]  Admit to Progressive based on following criteria: MULTISYSTEM THREATS such as stable sepsis,  metabolic/electrolyte imbalance with or without encephalopathy that is responding to early treatment.  May admit patient to [facility] or [facility] if equivalent level of care  is available:: No  Diagnosis: Anorexia [783.0.ICD-9-CM]  Admitting Physician: Shanica Castellanos [[id number]]  Attending Physician: Koran Seabrook [[id number]]  Certification:: I certify this patient will need inpatient services for at least 2 midnights  Expected Medical Readiness: 05/05/2024            inpatient     I Expect 2 midnight stay secondary to severity of patient's current illness need for inpatient interventions justified by the following:     Severe lab/radiological/exam abnormalities including:    Dehydration  Hypoglycemia   and extensive comorbidities including: Schizophrenia  That are currently affecting medical management.   I expect  patient to be hospitalized for 2 midnights requiring inpatient medical care.  Patient is at high risk for adverse outcome (such as loss of life or disability) if not treated.  Indication for inpatient stay as follows: Psychosis metabolic acidosis  Need for , IV fluids,,  Frequent labs    Level of care         progressive    Graciela Plato 05/02/2024, 10:46 PM    [facility]     after 2 AM please page floor coverage   If 7AM-7PM, please contact the day team taking care of the patient using [email or web address]

## 2024-05-02 NOTE — ED Notes (Signed)
 Report and 3 copies of IVC paperwork given to [facility] RN.

## 2024-05-03 ENCOUNTER — Encounter (HOSPITAL_COMMUNITY): Payer: Self-pay | Admitting: Internal Medicine

## 2024-05-03 DIAGNOSIS — E43 Unspecified severe protein-calorie malnutrition: Secondary | ICD-10-CM

## 2024-05-03 LAB — CBC
HCT: 41.6 % (ref 36.0–46.0)
Hemoglobin: 13.5 g/dL (ref 12.0–15.0)
MCH: 27.4 pg (ref 26.0–34.0)
MCHC: 32.5 g/dL (ref 30.0–36.0)
MCV: 84.4 fL (ref 80.0–100.0)
Platelets: 211 K/uL (ref 150–400)
RBC: 4.93 MIL/uL (ref 3.87–5.11)
RDW: 13.3 % (ref 11.5–15.5)
WBC: 7 K/uL (ref 4.0–10.5)
nRBC: 0 % (ref 0.0–0.2)

## 2024-05-03 LAB — COMPREHENSIVE METABOLIC PANEL WITH GFR
ALT: 14 U/L (ref 0–44)
AST: 20 U/L (ref 15–41)
Albumin: 3.5 g/dL (ref 3.5–5.0)
Alkaline Phosphatase: 59 U/L (ref 38–126)
Anion gap: 8 (ref 5–15)
BUN: 11 mg/dL (ref 8–23)
CO2: 25 mmol/L (ref 22–32)
Calcium: 8.5 mg/dL — ABNORMAL LOW (ref 8.9–10.3)
Chloride: 103 mmol/L (ref 98–111)
Creatinine, Ser: 0.97 mg/dL (ref 0.44–1.00)
GFR, Estimated: >60 mL/min (ref >60)
Glucose, Bld: 175 mg/dL — ABNORMAL HIGH (ref 70–99)
Potassium: 3.9 mmol/L (ref 3.5–5.1)
Sodium: 136 mmol/L (ref 135–145)
Total Bilirubin: 0.8 mg/dL (ref 0.0–1.2)
Total Protein: 6.3 g/dL — ABNORMAL LOW (ref 6.5–8.1)

## 2024-05-03 LAB — HIV ANTIBODY (ROUTINE TESTING W REFLEX): HIV Screen 4th Generation wRfx: NONREACTIVE

## 2024-05-03 LAB — GLUCOSE, CAPILLARY
Glucose-Capillary: 97 mg/dL (ref 70–99)
Glucose-Capillary: 167 mg/dL — ABNORMAL HIGH (ref 70–99)

## 2024-05-03 LAB — CBG MONITORING, ED
Glucose-Capillary: 30 mg/dL — CL (ref 70–99)
Glucose-Capillary: 290 mg/dL — ABNORMAL HIGH (ref 70–99)
Glucose-Capillary: 93 mg/dL (ref 70–99)

## 2024-05-03 LAB — PREALBUMIN: Prealbumin: 9 mg/dL — ABNORMAL LOW (ref 18–38)

## 2024-05-03 LAB — MAGNESIUM: Magnesium: 2 mg/dL (ref 1.7–2.4)

## 2024-05-03 LAB — CK: Total CK: 340 U/L — ABNORMAL HIGH (ref 38–234)

## 2024-05-03 LAB — PHOSPHORUS: Phosphorus: 3.1 mg/dL (ref 2.5–4.6)

## 2024-05-03 MED ORDER — DEXTROSE 50 % IV SOLN
1.0000 | Freq: Once | INTRAVENOUS | Status: AC
  Administered 2024-05-03: 50 mL via INTRAVENOUS

## 2024-05-03 MED ORDER — ONDANSETRON HCL 4 MG/2ML IJ SOLN: 4.0000 mg | Freq: Four times a day (QID) | INTRAMUSCULAR | Status: DC | PRN

## 2024-05-03 MED ORDER — ACETAMINOPHEN 325 MG PO TABS: 650.0000 mg | ORAL_TABLET | Freq: Four times a day (QID) | ORAL | Status: DC | PRN

## 2024-05-03 MED ORDER — ONDANSETRON HCL 4 MG PO TABS: 4.0000 mg | ORAL_TABLET | Freq: Four times a day (QID) | ORAL | Status: DC | PRN

## 2024-05-03 MED ORDER — ACETAMINOPHEN 650 MG RE SUPP: 650.0000 mg | Freq: Four times a day (QID) | RECTAL | Status: DC | PRN

## 2024-05-03 MED ORDER — DEXTROSE 50 % IV SOLN
25.0000 g | INTRAVENOUS | Status: AC
  Administered 2024-05-03: 25 g via INTRAVENOUS

## 2024-05-03 MED ORDER — DEXTROSE 50 % IV SOLN
INTRAVENOUS | Status: AC
  Filled 2024-05-03: qty 50

## 2024-05-03 MED ORDER — HYALURONIDASE HUMAN 150 UNIT/ML IJ SOLN
150.0000 [IU] | Freq: Once | INTRAMUSCULAR | Status: AC
  Administered 2024-05-03: 150 [IU] via SUBCUTANEOUS
  Filled 2024-05-03: qty 1

## 2024-05-03 MED ORDER — HYDROCODONE-ACETAMINOPHEN 5-325 MG PO TABS: 1.0000 | ORAL_TABLET | ORAL | Status: DC | PRN

## 2024-05-03 MED ORDER — INSULIN ASPART 100 UNIT/ML IJ SOLN: 0.0000 [IU] | Freq: Three times a day (TID) | INTRAMUSCULAR | Status: DC

## 2024-05-03 NOTE — TOC Initial Note (Signed)
 Transition of Care [facility]) - Initial/Assessment Note    Patient Details  Name: [name] MRN: [id number] Date of Birth: [birth year]  Transition of Care [facility] CM/SW Contact:    Marval Gell, RN Phone Number: 05/03/2024, 3:01 PM  Clinical Narrative:                   Per chart review   From [facility] with c/o hypoglycemia. Pt has not been eating or drinking for several days.  Hx schizophrenia.   Psych recommends inpatient psychiatric hospitalization after medical hospitalization. Patient has been involuntarily committed on 04/29/2024.   Expected Discharge Plan: Psychiatric Hospital Barriers to Discharge: Continued Medical Work up   Patient Goals and CMS Choice            Expected Discharge Plan and Services                                              Prior Living Arrangements/Services                       Activities of Daily Living   ADL Screening (condition at time of admission) Independently performs ADLs?: Yes (appropriate for developmental age) Is the patient deaf or have difficulty hearing?: No Does the patient have difficulty seeing, even when wearing glasses/contacts?: No Does the patient have difficulty concentrating, remembering, or making decisions?: Yes  Permission Sought/Granted                  Emotional Assessment              Admission diagnosis:  Anorexia [R63.0] Dehydration [E86.0] Hypoglycemia [E16.2] Inappropriate diet or eating habits [Z72.4] Psychosis, unspecified psychosis type (HCC) [F29] Patient Active Problem List   Diagnosis Date Noted   Psychosis (HCC) 01/20/2023   Schizoaffective disorder, depressive type (HCC) 04/08/2022   Ventricular tachycardia (HCC) 10/13/2021   Sinus bradycardia 10/03/2021   Anemia of chronic disease 10/02/2021   Hypomagnesemia 09/22/2021   Hypokalemia 09/22/2021   Hypophosphatemia 09/21/2021   Hyponatremia 09/20/2021   Metabolic acidosis 09/16/2021   Protein-calorie  malnutrition, severe 03/27/2020   Hypotension 03/26/2020   Anorexia 03/26/2020   Hypokalemia, hypomagnesemia and hypophosphatemia 03/26/2020   Hypocalcemia 03/26/2020   Fall at home, initial encounter 03/26/2020   Recurrent hypoglycemia and severe malnutrition 03/14/2020   Paranoid schizophrenia (HCC) 03/01/2020   Noncompliance with diet and medication regimen    Psychoses (HCC)    Tobacco abuse 03/12/2014   Hypokalemic alkalosis 11/11/2011   Medically noncompliant 11/11/2011   Schizophrenia, paranoid (HCC) 11/07/2011   PCP:  [facility] Pharmacy:   [facility] [address] [place] [address] Phone: [phone] Fax: [phone]  [facility] - [facility] [address] [place] [address] Phone: [phone] Fax: [phone]     Social Drivers of Health (SDOH) Social History: SDOH Screenings   Food Insecurity: No Food Insecurity (04/30/2024)  Housing: Low Risk  (01/19/2023)  Transportation Needs: No Transportation Needs (04/30/2024)  Utilities: Not At Risk (04/30/2024)  Alcohol Screen: Low Risk  (04/08/2022)  Tobacco Use: High Risk (05/03/2024)   SDOH Interventions:     Readmission Risk Interventions     No data to display

## 2024-05-03 NOTE — ED Provider Notes (Signed)
 Call to bedside for nursing concern.  Nurse noted that right hand IV had infiltrated and right hand had become extremely swollen.  On my exam the right hand has massive swelling and the tips of all 4 fingers are purple.  The IV was running D5 half NS as well as NS, nothing particularly caustic.  With massaging and milking of the fingertips the purple discoloration is improving.  She has intact sensation of the fingertips, she can move all fingers, but she is obviously having impaired venous return.  Placing warm packs and I spoke with pharmacy, given the degree of swelling will provide injections of hyaluronidase.  Monitoring the hand closely, seems to be improving, hospitalist notified.   Theadore Ozell HERO, MD 05/03/24 [phone]

## 2024-05-03 NOTE — ED Notes (Signed)
 Chief Complaint  Patient presents with   Hypoglycemia   Past Medical History:  Diagnosis Date   Anemia 10/02/2021   Non compliance w medication regimen    Schizophrenia (HCC)     BP 125/62 (BP Location: Right Arm)   Pulse (!) 53   Temp 97.8 F (36.6 C) (Oral)   Resp 15   Ht 5' 4 (1.626 m)   Wt 40.9 kg   SpO2 100%   BMI 15.48 kg/m   I noticed pt was pulling at IV so went into room to investigate, pt iv had infiltrated severely, pt finger were blue, used seat belt cutter to remove pt arm band, pt weeping fluids, got MD to look at pt and assess infiltrate   Jagjit Riner, Ozell Journey

## 2024-05-03 NOTE — Consult Note (Signed)
 [facility] Psychiatric Consult Initial  Patient Name: .[name]  MRN: [id number]  DOB: 1960/08/16  Consult Order details:  Orders (From admission, onward)     Start     Ordered   05/02/24 2112  IP CONSULT TO PSYCHIATRY       Ordering Provider: Doutova, Anastassia, MD  Provider:  (Not yet assigned)  Question Answer Comment  Location [facility]   Reason for Consult? schizophrenia, refusing to eat      05/02/24 2111             Mode of Visit: In person    Psychiatry Consult Evaluation  Service Date: May 03, 2024 LOS:  LOS: 1 day  Chief Complaint the patient is a 63 year old female with a history of schizophrenia who was initially admitted to [facility] behavioral health urgent care under IVC with complaints of aggressive behavior.  The consult was to continue to follow her up Since she is being admitted to the medical facility for refusal to eat and drink and her blood sugars were very low. Primary Psychiatric Diagnoses  Paranoid schizophrenia 2.  Refusal to eat or drink and failure to thrive.   Assessment  Dora Forrer is a 63 y.o. female admitted: Medicallyfor 05/02/2024 10:56 AM for dehydration, failure to thrive and hypoglycemia. She carries the psychiatric diagnoses of hypoglycemia and failure to thrive and has a past medical history of sinus bradycardia, chronic anemia, severe malnutrition and recurrent hypoglycemia..   Her current presentation of verbal aggression, agitation and refusal to eat with severe paranoia is most consistent with paranoid schizophrenia. She meets criteria for psychosis due to paranoid schizophrenia based on current presentation.  She was at [facility] where she was admitted on 04/30/2024.  She was transferred to the ED because of failure to thrive.  She is currently taking olanzapine  5 mg twice a day trazodone  50 mg at bedtime. She will be continued on that medication in addition to an agitation protocol.  She will also  require continuation of her IVC. Please see below for full recommendations Diagnoses:  Active Hospital problems: Principal Problem:   Anorexia Active Problems:   Schizophrenia, paranoid (HCC)   Recurrent hypoglycemia and severe malnutrition   Protein-calorie malnutrition, severe   Metabolic acidosis   Anemia of chronic disease    Plan   ## Psychiatric Medication Recommendations:  Olanzapine  5 mg twice daily, increase as tolerated.  We will consider nonemergency forced medication.  Will require a second physician and we can initiate routine medications. Trazodone  50 mg at bedtime. Recommend agitation protocol.  May use olanzapine . During her admission in 2023, patient was on lithium , Haldol , Remeron  and olanzapine  and apparently has also been on quetiapine .  Although 3 antipsychotics may not be indicated, we will continue to titrate medications as required. ## Medical Decision Making Capacity: Patient has a guardian and has thus been adjudicated incompetent; please involve patients guardian in medical decision making The patient is also an IVC that should be continued however if she has a guardian, the guardian can sign her voluntarily. ## Further Work-up:  -- As per the hospitalist EKG -- most recent EKG on 05/02/2024 had QtC of 483 -- Pertinent labwork reviewed earlier this admission includes: As per the hospitalist   ## Disposition:-- We recommend inpatient psychiatric hospitalization after medical hospitalization. Patient has been involuntarily committed on 04/29/2024.   ## Behavioral / Environmental: -Difficult Patient (SELECT OPTIONS FROM BELOW) or Utilize compassion and acknowledge the patient's experiences while setting clear and  realistic expectations for care.    ## Safety and Observation Level:  - Based on my clinical evaluation, I estimate the patient to be at moderate risk of self harm in the current setting. - At this time, we recommend  1:1 Observation. This  decision is based on my review of the chart including patient's history and current presentation, interview of the patient, mental status examination, and consideration of suicide risk including evaluating suicidal ideation, plan, intent, suicidal or self-harm behaviors, risk factors, and protective factors. This judgment is based on our ability to directly address suicide risk, implement suicide prevention strategies, and develop a safety plan while the patient is in the clinical setting. Please contact our team if there is a concern that risk level has changed.  CSSR Risk Category:C-SSRS RISK CATEGORY: No Risk  Suicide Risk Assessment: Patient has following modifiable risk factors for suicide: recklessness, medication noncompliance, triggering events, and recent psychiatric hospitalization, which we are addressing by admission to psychiatric floor once medically stable. Patient has following non-modifiable or demographic risk factors for suicide: history of self harm behavior and psychiatric hospitalization Patient has the following protective factors against suicide: Supportive family  Thank you for this consult request. Recommendations have been communicated to the primary team.  We will continue to follow at this time.   PAULETTE BEETS, MD       History of Present Illness  Relevant Aspects of [facility] Course:  Admitted on 05/02/2024 for dehydration, failure to thrive. They admitting for stabilization.   Patient Report:  The patient was recently transferred from [facility] on 05/02/2024.  Apparently for several days patient has been refusing to eat or drink anything but has been taking her medication.  She has remained tangential illogical and irritable on approach and was initially admitted for aggressive behavior towards her mother.  Patient has a legal guardian.  However she is on IVC currently.  Despite multiple attempts at the [facility] the patient continued to refuse any oral food or  fluids.  She was transferred to the ED yesterday because of failure to thrive and hypoglycemia.  She will be admitted to the medical floor.  On assessment today the patient is lying in bed appears extremely disheveled emaciated and irritable.  She basically states that she wants to go home and that she is here because her friend sent her here and that she cannot tolerate her friend that she lives with.  She is very labile, delusional and irritable and although she denies any active suicidal or homicidal ideations she unable to comprehend the reason why she is here or the treatment.  She has poor insight and poor judgment at this time she is not capable of making her own decisions.  She is also not been taking any p.o. medications so far and she was on forced medication order but was unable to be enforced because she was agitated.  Patient currently has medications on the agitation protocol. She also benefit from nonemergency forced medications but request to physicians to agree.  Psych ROS:  Depression: Denies Anxiety: Appears very anxious Mania (lifetime and current): Unknown Psychosis: (lifetime and current): Long history of psychosis  Collateral information:  Contacted Tilghman-Lennon, [name] at [phone] on 05/03/2024 with no success.  There is no voicemail set up.  Review of Systems  Reason unable to perform ROS: performed by the hospitalist. Patient is verbally  agressive.and uncooperative.  Constitutional:  Positive for weight loss.  Psychiatric/Behavioral:  Positive for hallucinations. The patient is nervous/anxious.  Psychiatric and Social History  Psychiatric History:  Information collected from patient, medical records  Prev Dx/Sx: Paranoid schizophrenia Current Psych Provider: Unknown Home Meds (current): Zyprexa , lithium , Remeron  Previous Med Trials: Multiple Therapy: Unknown  Prior Psych Hospitalization: Last hospitalizations 2023 to [facility] multiple prior  hospitalizations. Prior Self Harm: Positive history Prior Violence: History of violence towards mother  Family Psych History: Unknown Family Hx suicide: Unknown  Social History:  Well-documented in the medical records.  Patient is unmarried has no children she lives at home with her mother who is the guardian. Access to weapons/lethal means: Unknown  Substance History Denies any history and UDS is negative.  Exam Findings  Physical Exam: As per the hospitalist Vital Signs:  Temp:  [97.8 F (36.6 C)-98 F (36.7 C)] 98 F (36.7 C) (10/15 0128) Pulse Rate:  [50-56] 50 (10/15 0128) Resp:  [11-19] 14 (10/15 0822) BP: (102-145)/(50-133) 122/68 (10/15 0815) SpO2:  [100 %] 100 % (10/15 0822) Weight:  [40.9 kg] 40.9 kg (10/14 2304) Blood pressure 122/68, pulse (!) 50, temperature 98 F (36.7 C), temperature source Oral, resp. rate 14, height 5' 4 (1.626 m), weight 40.9 kg, SpO2 100%. Body mass index is 15.48 kg/m.  Physical Exam Vitals and nursing note reviewed. Exam conducted with a chaperone present.  Constitutional:      Appearance: She is ill-appearing.  Eyes:     Pupils: Pupils are equal, round, and reactive to light.  Neurological:     Mental Status: She is oriented to person, place, and time.     Mental Status Exam: General Appearance: Bizarre, Disheveled, and Guarded  Orientation:  Full (Time, Place, and Person)  Memory:  NA  Concentration:  Concentration: Poor and Attention Span: Poor  Recall:  Fair  Attention  Poor  Eye Contact:  Fair  Speech:  Pressured  Language:  Fair  Volume:  Increased  Mood: Irritable  Affect:  Labile  Thought Process:  Disorganized  Thought Content:  Paranoid Ideation and Rumination  Suicidal Thoughts:  No  Homicidal Thoughts:  Yes.  with intent/plan  Judgement:  Impaired  Insight:  Lacking  Psychomotor Activity:  Increased  Akathisia:  No  Fund of Knowledge:  Fair      Assets:  Housing Social Support  Cognition:  Impaired,   Mild  ADL's:  Intact  AIMS (if indicated):        Other History   These have been pulled in through the EMR, reviewed, and updated if appropriate.  Family History:  The patient's family history is not on file.  Medical History: Past Medical History:  Diagnosis Date   Anemia 10/02/2021   Non compliance w medication regimen    Schizophrenia [facility])     Surgical History: Past Surgical History:  Procedure Laterality Date   BIOPSY  09/25/2021   Procedure: BIOPSY;  Surgeon: Teressa Toribio SQUIBB, MD;  Location: [facility];  Service: Gastroenterology;;   ESOPHAGOGASTRODUODENOSCOPY (EGD) WITH PROPOFOL  [name] 09/25/2021   Procedure: ESOPHAGOGASTRODUODENOSCOPY (EGD) WITH PROPOFOL ;  Surgeon: Teressa Toribio SQUIBB, MD;  Location: [facility];  Service: Gastroenterology;  Laterality: [name];  With NGT placement     Medications:   Current Facility-Administered Medications:    acetaminophen  (TYLENOL ) tablet 650 mg, 650 mg, Oral, Q6H PRN **OR** acetaminophen  (TYLENOL ) suppository 650 mg, 650 mg, Rectal, Q6H PRN, Doutova, Anastassia, MD   dextrose  5 % and 0.45 % NaCl infusion, , Intravenous, Continuous, Doutova, Anastassia, MD, Last Rate: 100 mL/hr at 05/02/24 2320, New Bag at 05/02/24 2320  HYDROcodone-acetaminophen  (NORCO/VICODIN) 5-325 MG per tablet 1-2 tablet, 1-2 tablet, Oral, Q4H PRN, Doutova, Anastassia, MD   OLANZapine  (ZYPREXA ) tablet 5 mg, 5 mg, Oral, BID, Doutova, Anastassia, MD   ondansetron  (ZOFRAN ) tablet 4 mg, 4 mg, Oral, Q6H PRN **OR** ondansetron  (ZOFRAN ) injection 4 mg, 4 mg, Intravenous, Q6H PRN, Doutova, Anastassia, MD   thiamine  (VITAMIN B1) 500 mg in sodium chloride  0.9 % 50 mL IVPB, 500 mg, Intravenous, Daily, Emil, Dan, DO, Stopped at 05/02/24 2141   thiamine  (VITAMIN B1) tablet 100 mg, 100 mg, Oral, Daily, Doutova, Anastassia, MD   traZODone  (DESYREL ) tablet 50 mg, 50 mg, Oral, QHS, Doutova, Anastassia, MD No current outpatient medications on file.  Allergies: Allergies  Allergen  Reactions   Aspirin Hives, Nausea Only and Other (See Comments)    Possibly caused hives, per sister and caused STOMACH UPSET   Ziprasidone Hcl Hives, Rash and Other (See Comments)    GEODON = Caused twitching and redness of eyes (also)    PAULETTE BEETS, MD

## 2024-05-03 NOTE — Progress Notes (Signed)
 [facility]                                                                               [name], is a 63 y.o. female, DOB - 04/04/1961, FMW:[id number] Admit date - 05/02/2024    Outpatient Primary MD for the patient is [facility]  LOS - 1  days    Brief summary   [name] is a 63 y.o. female with prior h/o anemia,  severe protein calorie malnutrition,  presented to ED 05/02/2024 10:56 AM for dehydration, failure to thrive and hypoglycemia and psychosis.  Assessment & Plan    Assessment and Plan:   Recurrent hypoglycemia from poor oral intake sec to psychosis ? Severe protein calorie malnutrition: Nutrition consulted.  Currently on SSI.  Replace electrolytes as needed.  Continue with thiamine .  TSH is wnl.    Metabolic acidosis  Resolved.    Mild rhabdomyolysis Continue with IV fluids.   Psychosis  Resume zyprexa  .  Psychiatry consulted and recommendations given.  She is IVCed.  Rule out infection.  Check UA   Abnormal EKG Recheck EKG in am.     Estimated body mass index is 15.48 kg/m as calculated from the following:   Height as of this encounter: 5' 4 (1.626 m).   Weight as of this encounter: 40.9 kg.  Code Status: FULL CODE.  DVT Prophylaxis:  SCDs Start: 05/03/24 0358   Level of Care: Level of care: Progressive Family Communication: discussed with brother at bedside.   Disposition Plan:     Remains inpatient appropriate:  pending.   Procedures:  None.   Consultants:   Psychiatry.   Antimicrobials:   Anti-infectives (From admission, onward)    None        Medications  Scheduled Meds:  insulin aspart  0-6 Units Subcutaneous TID WC   OLANZapine   5 mg Oral BID   thiamine   100 mg Oral Daily   traZODone   50 mg Oral QHS   Continuous Infusions:  dextrose  5 % and 0.45 % NaCl 100 mL/hr at 05/03/24 1141   thiamine  (VITAMIN B1) injection Stopped (05/02/24 2141)   PRN Meds:.acetaminophen  **OR**  acetaminophen , HYDROcodone-acetaminophen , ondansetron  **OR** ondansetron  (ZOFRAN ) IV    Subjective:   [name] was seen and examined today.  Anxious, agitated.   Objective:   Vitals:   05/03/24 0630 05/03/24 0815 05/03/24 0822 05/03/24 1230  BP: (!) 111/52 122/68  (!) 151/81  Pulse:    (!) 50  Resp: 14 18 14 16   Temp:    98.6 F (37 C)  TempSrc:    Oral  SpO2: 100% 100% 100%   Weight:      Height:        Intake/Output Summary (Last 24 hours) at 05/03/2024 1852 Last data filed at 05/03/2024 1600 Gross per 24 hour  Intake 2459.57 ml  Output --  Net 2459.57 ml   Filed Weights   05/02/24 2304  Weight: 40.9 kg     Exam General exam: Appears calm and comfortable  Respiratory system: Clear to auscultation. Respiratory effort normal. Cardiovascular system: S1 & S2 heard, RRR.  Gastrointestinal system: Abdomen is nondistended, soft and nontender.  Central nervous system: Alert , confused, anxious , swearing.  Extremities: No pedal edema.  Skin: No rashes,    Data Reviewed:  I have personally reviewed following labs and imaging studies   CBC Lab Results  Component Value Date   WBC 7.0 05/03/2024   RBC 4.93 05/03/2024   HGB 13.5 05/03/2024   HCT 41.6 05/03/2024   MCV 84.4 05/03/2024   MCH 27.4 05/03/2024   PLT 211 05/03/2024   MCHC 32.5 05/03/2024   RDW 13.3 05/03/2024   LYMPHSABS 1.1 05/02/2024   MONOABS 0.3 05/02/2024   EOSABS 0.0 05/02/2024   BASOSABS 0.0 05/02/2024     Last metabolic panel Lab Results  Component Value Date   NA 136 05/03/2024   K 3.9 05/03/2024   CL 103 05/03/2024   CO2 25 05/03/2024   BUN 11 05/03/2024   CREATININE 0.97 05/03/2024   GLUCOSE 175 (H) 05/03/2024   GFRNONAA >60 05/03/2024   GFRAA >60 04/04/2020   CALCIUM  8.5 (L) 05/03/2024   PHOS 3.1 05/03/2024   PROT 6.3 (L) 05/03/2024   ALBUMIN 3.5 05/03/2024   BILITOT 0.8 05/03/2024   ALKPHOS 59 05/03/2024   AST 20 05/03/2024   ALT 14 05/03/2024   ANIONGAP 8  05/03/2024    CBG (last 3)  Recent Labs    05/03/24 0159 05/03/24 0800 05/03/24 1304  GLUCAP 290* 93 97      Coagulation Profile: No results for input(s): INR, PROTIME in the last 168 hours.   Radiology Studies: No results found.     Elgie Butter M.D. [facility] 05/03/2024, 6:52 PM  Available via Epic secure chat 7am-7pm After 7 pm, please refer to night coverage provider listed on amion.

## 2024-05-03 NOTE — Progress Notes (Signed)
 Refused CBG and vital signs.

## 2024-05-03 NOTE — Progress Notes (Signed)
 Pt received lunch tray, ate 100% of food.

## 2024-05-03 NOTE — Evaluation (Signed)
 Physical Therapy Brief Evaluation and Discharge Note Patient Details Name: [name] MRN: [id number] DOB: Jul 22, 1960 Today's Date: 05/03/2024   History of Present Illness  Pt is 63 year old presented to [facility] on  05/02/24 for hypoglycemia due to refusal to eat/drink at psychiatric facility. PMH - paranoid schizophrenia  Clinical Impression  Pt doing well with mobility and no further PT needed.  Ready for dc from PT standpoint.        PT Assessment Patient does not need any further PT services  Assistance Needed at Discharge  Other (comment) (behavioral health hospital)    Equipment Recommendations None recommended by PT  Recommendations for Other Services       Precautions/Restrictions Precautions Precautions: Other (comment) Precaution/Restrictions Comments: psych sitter        Mobility  Bed Mobility Rolling: Independent Supine/Sidelying to sit: Independent Sit to supine/sidelying: Independent    Transfers Overall transfer level: Independent Equipment used: None                    Ambulation/Gait Ambulation/Gait assistance: Modified independent (Device/Increase time) Gait Distance (Feet): 200 Feet Assistive device: None, IV Pole Gait Pattern/deviations: WFL(Within Functional Limits) Gait Speed: Pace WFL General Gait Details: steady gait  Home Activity Instructions    Stairs            Modified Rankin (Stroke Patients Only)        Balance Overall balance assessment: No apparent balance deficits (not formally assessed)                        Pertinent Vitals/Pain PT - Brief Vital Signs All Vital Signs Stable: Yes Pain Assessment Pain Assessment: No/denies pain     Home Living Family/patient expects to be discharged to:: Other (Comment) (behavioral health facility)                  Prior Function Level of Independence: Independent Comments: Amb without assistive device    UE/LE Assessment   UE  ROM/Strength/Tone/Coordination: WFL    LE ROM/Strength/Tone/Coordination: [facility]      Communication   Communication Communication: No apparent difficulties     Cognition Overall Cognitive Status: History of cognitive impairments - at baseline       General Comments      Exercises     Assessment/Plan    PT Problem List         PT Visit Diagnosis Other (comment)    No Skilled PT Patient is independent with all acitivity/mobility   Co-evaluation                AMPAC 6 Clicks Help needed turning from your back to your side while in a flat bed without using bedrails?: None Help needed moving from lying on your back to sitting on the side of a flat bed without using bedrails?: None Help needed moving to and from a bed to a chair (including a wheelchair)?: None Help needed standing up from a chair using your arms (e.g., wheelchair or bedside chair)?: None Help needed to walk in hospital room?: None Help needed climbing 3-5 steps with a railing? : None 6 Click Score: 24      End of Session   Activity Tolerance: Patient tolerated treatment well Patient left: in bed;with nursing/sitter in room;with call bell/phone within reach Nurse Communication: Mobility status PT Visit Diagnosis: Other (comment)     Time: 9085-9077 PT Time Calculation (min) (ACUTE ONLY): 8 min  Charges:  PT Evaluation $PT Eval Low Complexity: 1 Low      [facility] PT Acute Rehabilitation Services Office [phone]   [place] ORN [facility]  05/03/2024, 10:14 AM

## 2024-05-03 NOTE — Progress Notes (Signed)
 OT Cancellation Note  Patient Details Name: [name] MRN: [id number] DOB: Sep 26, 1960   Cancelled Treatment:    Reason Eval/Treat Not Completed: OT screened, no needs identified, will sign off. Per evaluating PT, pt is independent, reporting no ADL/IADL concerns. OT is signing off on this pt. Please re-consult as necessary.    Lekeya Rollings C, OT  Acute Rehabilitation Services Office [phone] Secure chat preferred   Adrianne GORMAN Savers 05/03/2024, 10:16 AM

## 2024-05-04 DIAGNOSIS — R627 Adult failure to thrive: Secondary | ICD-10-CM

## 2024-05-04 LAB — GLUCOSE, CAPILLARY
Glucose-Capillary: 111 mg/dL — ABNORMAL HIGH (ref 70–99)
Glucose-Capillary: 96 mg/dL (ref 70–99)
Glucose-Capillary: 94 mg/dL (ref 70–99)
Glucose-Capillary: 88 mg/dL (ref 70–99)
Glucose-Capillary: 90 mg/dL (ref 70–99)
Glucose-Capillary: 107 mg/dL — ABNORMAL HIGH (ref 70–99)

## 2024-05-04 MED ORDER — THIAMINE MONONITRATE 100 MG PO TABS
100.0000 mg | ORAL_TABLET | Freq: Every day | ORAL | Status: DC
  Administered 2024-05-05 – 2024-05-06 (×2): 100 mg via ORAL
  Filled 2024-05-04 (×2): qty 1

## 2024-05-04 MED ORDER — OLANZAPINE 10 MG IM SOLR
5.0000 mg | Freq: Two times a day (BID) | INTRAMUSCULAR | Status: DC
  Filled 2024-05-04 (×5): qty 10

## 2024-05-04 MED ORDER — ENOXAPARIN SODIUM 300 MG/3ML IJ SOLN
20.0000 mg | INTRAMUSCULAR | Status: DC
  Administered 2024-05-04 – 2024-05-06 (×3): 20 mg via SUBCUTANEOUS
  Filled 2024-05-04 (×3): qty 0.2

## 2024-05-04 MED ORDER — ADULT MULTIVITAMIN W/MINERALS CH
1.0000 | ORAL_TABLET | Freq: Every day | ORAL | Status: DC
  Administered 2024-05-05 – 2024-05-06 (×2): 1 via ORAL
  Filled 2024-05-04 (×2): qty 1

## 2024-05-04 MED ORDER — ENSURE PLUS HIGH PROTEIN PO LIQD
237.0000 mL | Freq: Two times a day (BID) | ORAL | Status: DC
  Administered 2024-05-05: 237 mL via ORAL

## 2024-05-04 MED ORDER — OLANZAPINE 5 MG PO TABS
5.0000 mg | ORAL_TABLET | Freq: Two times a day (BID) | ORAL | Status: DC
  Administered 2024-05-04 – 2024-05-06 (×4): 5 mg via ORAL
  Filled 2024-05-04 (×4): qty 1

## 2024-05-04 NOTE — Consult Note (Addendum)
 NON- EMERGENCY FORCED MEDICATIONS EVALUATION  The patient is a 63 year old female with a long history of schizophrenia who is on IVC for aggressive behavior towards mother and refusal to eat or drink with a failure to thrive.  She was initially at [facility] for several days refusing any food or medications and had to be transferred to the medical floor for further stabilization. Patient continues to refuse all medications, food, and fluids, she remains paranoid and delusional the patient denies need for treatment and is unable to verbalize understanding of the risks of noncompliance.  When seen today the patient was lying in bed, extremely emaciated and he appears to be dehydrated.  She was irritable and labile and was observed to be severely psychotic, disorganized, and internally preoccupied. Thought processes are loose and illogical. Affect is labile; insight and judgment are markedly impaired. Patient appears malnourished and dehydrated due to ongoing refusal to eat or drink. Multiple attempts at verbal engagement, education, and offering oral medication alternatives have been unsuccessful.  Assessment: Severe psychosis with lack of capacity to make informed decisions regarding treatment. Persistent refusal of food and medication places the patient at risk for serious medical complications and further psychiatric deterioration. Less restrictive interventions have failed.  Plan: Proceed with a non-emergency forced medication order under [place] 122C-57(d[place] to ensure patient safety and promote psychiatric stabilization. Continue to monitor for side effects, encourage voluntary participation, and reassess decision-making capacity regularly.  Will continue to offer her routine medications olanzapine  5 mg twice daily, if she refuses p.o. medication, she could be offered the same medication as IM.  Will continue to evaluate periodically.  Labs: QTC on 05/02/2024 is 483 and will  continue to monitor.  Total Time Spent in Direct Patient Care:  I personally spent 25 minutes on the unit in direct patient care. The direct patient care time included face-to-face time with the patient, reviewing the patient's chart, communicating with other professionals, and coordinating care. Greater than 50% of this time was spent in counseling or coordinating care with the patient regarding goals of hospitalization, psycho-education, and discharge planning needs.   Paulette [facility] Psychiatrist

## 2024-05-04 NOTE — Progress Notes (Signed)
 Pt has remained irritable, offended and verbally abusive when approached for personal care.  She adamantly refused EKG this morning.  Sitter remains at bedside. Glade Lee BSN RN CMSRN 05/04/2024, 6:25 AM

## 2024-05-04 NOTE — Progress Notes (Signed)
 OT Cancellation Note  Patient Details Name: [name] MRN: [id number] DOB: [birth year]   Cancelled Treatment:    Reason Eval/Treat Not Completed: Fatigue/lethargy limiting ability to participate.  Sleeping soundly with sitter in place.  OT can try again later.  Jamirra Curnow D Bricen Victory 05/04/2024, 1:50 PM 05/04/2024  RP, OTR/L  Acute Rehabilitation Services  Office:  [phone]

## 2024-05-04 NOTE — Plan of Care (Signed)
  Problem: Education: Goal: Knowledge of General Education information will improve Description: Including pain rating scale, medication(s)/side effects and non-pharmacologic comfort measures Outcome: Not Progressing   Problem: Health Behavior/Discharge Planning: Goal: Ability to manage health-related needs will improve Outcome: Not Progressing   Problem: Clinical Measurements: Goal: Ability to maintain clinical measurements within normal limits will improve Outcome: Progressing Goal: Will remain free from infection Outcome: Progressing Goal: Diagnostic test results will improve Outcome: Progressing Goal: Respiratory complications will improve Outcome: Progressing Goal: Cardiovascular complication will be avoided Outcome: Progressing   Problem: Activity: Goal: Risk for activity intolerance will decrease Outcome: Progressing   Problem: Nutrition: Goal: Adequate nutrition will be maintained Outcome: Progressing   Problem: Coping: Goal: Level of anxiety will decrease Outcome: Not Progressing   Problem: Elimination: Goal: Will not experience complications related to bowel motility Outcome: Progressing Goal: Will not experience complications related to urinary retention Outcome: Not Progressing   Problem: Pain Managment: Goal: General experience of comfort will improve and/or be controlled Outcome: Progressing   Problem: Safety: Goal: Ability to remain free from injury will improve Outcome: Not Progressing   Problem: Skin Integrity: Goal: Risk for impaired skin integrity will decrease Outcome: Progressing   Problem: Education: Goal: Ability to describe self-care measures that may prevent or decrease complications (Diabetes Survival Skills Education) will improve Outcome: Not Progressing Goal: Individualized Educational Video(s) Outcome: Not Progressing   Problem: Coping: Goal: Ability to adjust to condition or change in health will improve Outcome: Not  Progressing   Problem: Fluid Volume: Goal: Ability to maintain a balanced intake and output will improve Outcome: Progressing   Problem: Health Behavior/Discharge Planning: Goal: Ability to identify and utilize available resources and services will improve Outcome: Not Progressing Goal: Ability to manage health-related needs will improve Outcome: Not Progressing   Problem: Metabolic: Goal: Ability to maintain appropriate glucose levels will improve Outcome: Progressing   Problem: Nutritional: Goal: Maintenance of adequate nutrition will improve Outcome: Progressing Goal: Progress toward achieving an optimal weight will improve Outcome: Not Progressing   Problem: Skin Integrity: Goal: Risk for impaired skin integrity will decrease Outcome: Progressing   Problem: Tissue Perfusion: Goal: Adequacy of tissue perfusion will improve Outcome: Progressing
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
## 2024-05-04 NOTE — Progress Notes (Signed)
 PT Cancellation Note  Patient Details Name: [name] MRN: [id number] DOB: [birth year]   Cancelled Treatment:    Reason Eval/Treat Not Completed: Other (comment) (New order 10/15 at 1911 . Eval and DC completed 10/15 at 1014. Please refer to this note. will not re-eval at this time.)   [name] B [name] 05/04/2024, 7:03 AM [name], PT Acute Rehabilitation Services Office: [phone]

## 2024-05-04 NOTE — Progress Notes (Addendum)
 PROGRESS NOTE        PATIENT DETAILS Name: [name] Age: 63 y.o. Sex: female Date of Birth: [birth year] Admit Date: 05/02/2024 Admitting Physician Blease Quiver, MD [facility]  Brief Summary: Patient is a 63 y.o.  female with history of paranoid schizophrenia-hospitalized at inpatient psych-transferred to [facility] patient became hypoglycemic and setting of poor oral intake.  Significant events: 10/14>> admit to [facility].  Significant studies: None  Significant microbiology data: None  Procedures: None  Consults: Psychiatry  Subjective: I want to get out of here that is what y'all are telling me  Objective: Vitals: Blood pressure 97/65, pulse 60, temperature 98.1 F (36.7 C), temperature source Oral, resp. rate 16, height 5' 4 (1.626 m), weight 40.9 kg, SpO2 100%.   Exam: Gen Exam:Awake-not in any distress HEENT:atraumatic, normocephalic Chest: B/L clear to auscultation anteriorly CVS:S1S2 regular Abdomen:soft non tender, non distended Extremities:no edema Neurology: Non focal Skin: no rash  Pertinent Labs/Radiology:    Latest Ref Rng & Units 05/03/2024    4:17 AM 05/02/2024    1:04 PM 05/02/2024   11:00 AM  CBC  WBC 4.0 - 10.5 K/uL 7.0   4.4   Hemoglobin 12.0 - 15.0 g/dL 86.4  81.9  83.7   Hematocrit 36.0 - 46.0 % 41.6  53.0  51.7   Platelets 150 - 400 K/uL 211   199     Lab Results  Component Value Date   NA 136 05/03/2024   K 3.9 05/03/2024   CL 103 05/03/2024   CO2 25 05/03/2024      Assessment/Plan: Hypoglycemia In the setting of poor oral intake/anorexia-due to flare of paranoid schizophrenia. CBGs have stabilized Per nursing staff-ate almost all of her dinner last night Recent on 10/14 A1c 5.7, TSH on 10/13 stable.  ACTH  stim test 2024's no indication of adrenal insufficiency. Continue to monitor CBGs closely On high-dose IV thiamine -will plan x 3 days, and change to oral thiamine  starting  tomorrow.  Recent Labs    05/04/24 0348 05/04/24 0610 05/04/24 0720  GLUCAP 96 94 88    Rhabdomyolysis Mild Doubt needs any further IVF  Mild metabolic acidosis Likely starvation ketoacidosis Resolved with supportive care  Paranoid schizophrenia Like clearly contributing to anorexia She is very suspicious and remains paranoid this morning-if she refuses antipsychotics voluntarily-she will likely need involuntary medication administration for optimization and stabilization of her hypoglycemia/secondary to poor oral intake/anorexia due to paranoid schizophrenia related psychosis.As it it is highly unlikely that the patient will spontaneously improve without treatment-and will remain at risk for mortality/morbidity due to hypoglycemia. Continue trazodone /olanzapine  Under IVC-sitter at bedside Psych team following-rec mentations are to proceed with inpatient psychiatric hospitalization after hypoglycemia stabilizes.  Code status:   Code Status: Full Code   DVT Prophylaxis: SCDs Start: 05/03/24 0358   Family Communication: None at bedside   Disposition Plan: Status is: Inpatient Remains inpatient appropriate because: Severity of illness.   Planned Discharge Destination: Inpatient psychiatry.   Diet: Diet Order             Diet regular Room service appropriate? Yes; Fluid consistency: Thin  Diet effective now                     Antimicrobial agents: Anti-infectives (From admission, onward)    None        MEDICATIONS: Scheduled Meds:  OLANZapine   5 mg Oral BID   thiamine   100 mg Oral Daily   traZODone   50 mg Oral QHS   Continuous Infusions:  thiamine  (VITAMIN B1) injection Stopped (05/02/24 2141)   PRN Meds:.acetaminophen  **OR** acetaminophen , HYDROcodone-acetaminophen , ondansetron  **OR** ondansetron  (ZOFRAN ) IV   I have personally reviewed following labs and imaging studies  LABORATORY DATA: CBC: Recent Labs  Lab 05/01/24 2011 05/02/24 1100  05/02/24 1304 05/03/24 0417  WBC 6.5 4.4  --  7.0  NEUTROABS 4.0 3.0  --   --   HGB 15.2* 16.2* 18.0* 13.5  HCT 47.1* 51.7* 53.0* 41.6  MCV 84.1 86.5  --  84.4  PLT 245 199  --  211    Basic Metabolic Panel: Recent Labs  Lab 05/01/24 2011 05/02/24 1215 05/02/24 1304 05/02/24 1447 05/02/24 2229 05/03/24 0417  NA 140 140 140 140 135 136  K 3.5 3.9 3.9 3.5 3.8 3.9  CL 100 102 107 104 102 103  CO2 23 16*  --  18* 21* 25  GLUCOSE 65* 73 74 134* 119* 175*  BUN 12 19 23 15 12 11   CREATININE 0.92 1.13* 1.00 0.80 0.86 0.97  CALCIUM  9.3 9.5  --  8.1* 8.9 8.5*  MG  --  2.4  --   --   --  2.0  PHOS  --  4.8*  --   --   --  3.1    GFR: Estimated Creatinine Clearance: 38.3 mL/min (by C-G formula based on SCr of 0.97 mg/dL).  Liver Function Tests: Recent Labs  Lab 05/01/24 2011 05/02/24 1215 05/03/24 0417  AST 24 25 20   ALT 16 15 14   ALKPHOS 78 87 59  BILITOT 1.1 1.2 0.8  PROT 7.6 8.2* 6.3*  ALBUMIN 4.3 4.6 3.5   No results for input(s): LIPASE, AMYLASE in the last 168 hours. Recent Labs  Lab 05/02/24 2229  AMMONIA 21    Coagulation Profile: No results for input(s): INR, PROTIME in the last 168 hours.  Cardiac Enzymes: Recent Labs  Lab 05/02/24 2229 05/03/24 0417  CKTOTAL 366* 340*    BNP (last 3 results) No results for input(s): PROBNP in the last 8760 hours.  Lipid Profile: Recent Labs    05/01/24 2011  CHOL 222*  HDL 79  LDLCALC 130*  TRIG 63  CHOLHDL 2.8    Thyroid  Function Tests: Recent Labs    05/01/24 2011  TSH 1.352    Anemia Panel: No results for input(s): VITAMINB12, FOLATE, FERRITIN, TIBC, IRON, RETICCTPCT in the last 72 hours.  Urine analysis:    Component Value Date/Time   COLORURINE YELLOW 04/30/2024 1221   APPEARANCEUR CLEAR 04/30/2024 1221   LABSPEC 1.014 04/30/2024 1221   PHURINE 5.0 04/30/2024 1221   GLUCOSEU NEGATIVE 04/30/2024 1221   HGBUR SMALL (A) 04/30/2024 1221   BILIRUBINUR NEGATIVE  04/30/2024 1221   KETONESUR 5 (A) 04/30/2024 1221   PROTEINUR NEGATIVE 04/30/2024 1221   UROBILINOGEN 0.2 04/10/2015 1111   NITRITE NEGATIVE 04/30/2024 1221   LEUKOCYTESUR NEGATIVE 04/30/2024 1221    Sepsis Labs: Lactic Acid, Venous    Component Value Date/Time   LATICACIDVEN 1.9 03/26/2020 0202    MICROBIOLOGY: Recent Results (from the past 240 hours)  SARS Coronavirus 2 by RT PCR (hospital order, performed in [facility] hospital lab) *cepheid single result test* Anterior Nasal Swab     Status: None   Collection Time: 04/30/24 10:04 AM   Specimen: Anterior Nasal Swab  Result Value Ref Range Status   SARS  Coronavirus 2 by RT PCR NEGATIVE NEGATIVE Final    Comment: Performed at [facility], [address], [place], [place] [address]    RADIOLOGY STUDIES/RESULTS: No results found.   LOS: 2 days   Paula Applebaum, MD  [facility]    To contact the attending provider between 7A-7P or the covering provider during after hours 7P-7A, please log into the web site [email or web address] and access using universal [employer] password for that web site. If you do not have the password, please call the hospital operator.  05/04/2024, 8:37 AM

## 2024-05-04 NOTE — Progress Notes (Addendum)
 Initial Nutrition Assessment  DOCUMENTATION CODES:   Underweight - likely still meets criteria for severe protein calorie malnutrition. Unable to verify until repeat NFPE can be completed.   INTERVENTION:  Monitor magnesium , potassium, and phosphorus daily for at least 3 days, MD to replete as needed Continue IV thiamine  d/t refeeding risk Add MVI w/ minerals Continue liberalized diet to maximize intake Add Ensure Plus High Protein po BID, each supplement provides 350 kcal and 20 grams of protein  Add Magic cup TID with meals, each supplement provides 290 kcal and 9 grams of protein    NUTRITION DIAGNOSIS:   Inadequate oral intake related to lethargy/confusion, decreased appetite as evidenced by other (comment), per patient/family report (psychosis).  GOAL:   Weight gain  MONITOR:   PO intake, Supplement acceptance, Weight trends, Skin, Labs  REASON FOR ASSESSMENT:   Consult Assessment of nutrition requirement/status  ASSESSMENT:   Pt with PMH significamt for: severe protein calorie malnutrition, anemia, schizophrenia, and noncompliance with medication regimen. Presents with dehydration, failure to thrive, hypoglycemia, and psychosis.  She is involuntarily committed with psych recommending inpatient psychiatric hospitalization s/p medical hospitalization. Chart review states patient has not been eating or drinking for several days prior to admission. This is inducing hypoglycemia. Already on high dose thiamine .   Average Meal Intake 10/15: 100% x 2 documented meals 10/16: 100% x2 documented meal  Unwilling/unable to report usual day's intake as well as preference for ONS. Documented with adequate PO intake. Meals reviewed and substantial in amount and macronutrient content. Often becoming upset or confused with RD when asked basic questions about nutrition history. Some nonsensical answers. Will re-attempt at later time. Sitter states they will order meals for her.    Remains at high risk for decline 2/2 starvation induced hypoglycemia.   Admit/Current Weight: 40.9 kg - appears pulled forward from July of last year; will need new weight collected  Per chart review, patient with no recent weight history to review. Most recent weight appears pulled forward from encounter over one year ago. Will request new weight to assess most recent weight trend and better estimated needs. She states her doctor keeps up with her weight, but goes on to report UBW between 99-105lbs.   Meds: IV thiamine   She is at elevated risk for refeeding given poor intake for indeterminate amount of time. Thiamine  already ordered. Will monitor for refeeding now that oral intake improved and consistent. Also at increased risk for micronutrient deficiency. Will re-assess when able to complete NFPE.  Labs from 10/15 reviewed:  Na+ 136 (wdl) K+ 3.9 (wdl) Mg 2.0 (wdl) PHOS 3.1 (wdl) CBGs 119-175 x24 hours A1c 5.7 (04/2024)   NUTRITION - FOCUSED PHYSICAL EXAM: Pt unwilling to participate in NFPE with RD; will defer to next in person visit. Upon visual inspection, noted severe muscle and fat depletions, however cannot verify without palpation. She likely remains appropriate for severe malnutrition criteria, as is noted in her hx.   Diet Order:   Diet Order             Diet regular Room service appropriate? Yes; Fluid consistency: Thin  Diet effective now                   EDUCATION NEEDS:   Not appropriate for education at this time  Skin:  Skin Assessment: Reviewed RN Assessment  Last BM:  PTA  Height:  Ht Readings from Last 1 Encounters:  05/02/24 5' 4 (1.626 m)   Weight:  Wt Readings  from Last 1 Encounters:  05/02/24 40.9 kg   Ideal Body Weight:  54.5 kg  BMI:  Body mass index is 15.48 kg/m.  Estimated Nutritional Needs:   Kcal:  1400-1600 kcals  Protein:  60-75g  Fluid:  1.4-1.6L/day  Blair Deaner MS, RD, LDN Registered Dietitian Clinical  Nutrition RD Inpatient Contact Info in Amion

## 2024-05-05 LAB — GLUCOSE, CAPILLARY
Glucose-Capillary: 80 mg/dL (ref 70–99)
Glucose-Capillary: 88 mg/dL (ref 70–99)
Glucose-Capillary: 81 mg/dL (ref 70–99)
Glucose-Capillary: 137 mg/dL — ABNORMAL HIGH (ref 70–99)

## 2024-05-05 MED ORDER — ENSURE PLUS HIGH PROTEIN PO LIQD: 237.0000 mL | Freq: Three times a day (TID) | ORAL | Status: DC

## 2024-05-05 MED ORDER — SODIUM CHLORIDE 0.9 % IV BOLUS
250.0000 mL | INTRAVENOUS | Status: AC
  Administered 2024-05-05: 250 mL via INTRAVENOUS

## 2024-05-05 NOTE — Plan of Care (Signed)
  Problem: Health Behavior/Discharge Planning: Goal: Ability to manage health-related needs will improve Outcome: Progressing   Problem: Clinical Measurements: Goal: Respiratory complications will improve Outcome: Progressing   Problem: Activity: Goal: Risk for activity intolerance will decrease Outcome: Progressing   

## 2024-05-05 NOTE — Progress Notes (Signed)
 PROGRESS NOTE        PATIENT DETAILS Name: [name] Age: 63 y.o. Sex: female Date of Birth: March 25, 1961 Admit Date: 05/02/2024 Admitting Physician Blease Quiver, MD ERE:[facility]  Brief Summary: Patient is a 63 y.o.  female with history of paranoid schizophrenia-hospitalized at inpatient psych-transferred to [facility] patient became hypoglycemic and setting of poor oral intake.  Significant events: 10/14>> admit to [facility].  Significant studies: None  Significant microbiology data: None  Procedures: None  Consults: Psychiatry  Subjective: Irritable when asked questions but apparently is eating almost all of her meals.  No hypoglycemic events for almost 2 days.  Objective: Vitals: Blood pressure (!) 110/50, pulse (!) 43, temperature 98.4 F (36.9 C), temperature source Oral, resp. rate 16, height 5' 4 (1.626 m), weight 40.9 kg, SpO2 100%.   Exam: Gen Exam:Alert awake-not in any distress HEENT:atraumatic, normocephalic Chest: B/L clear to auscultation anteriorly CVS:S1S2 regular Abdomen:soft non tender, non distended Extremities:no edema Neurology: Non focal Skin: no rash  Pertinent Labs/Radiology:    Latest Ref Rng & Units 05/03/2024    4:17 AM 05/02/2024    1:04 PM 05/02/2024   11:00 AM  CBC  WBC 4.0 - 10.5 K/uL 7.0   4.4   Hemoglobin 12.0 - 15.0 g/dL 86.4  81.9  83.7   Hematocrit 36.0 - 46.0 % 41.6  53.0  51.7   Platelets 150 - 400 K/uL 211   199     Lab Results  Component Value Date   NA 136 05/03/2024   K 3.9 05/03/2024   CL 103 05/03/2024   CO2 25 05/03/2024      Assessment/Plan: Hypoglycemia In a setting of poor oral intake/anorexia-due to flare of paranoid schizophrenia. CBGs have stabilized-no hypoglycemic events for almost 48 hours. Per nursing staff-eating almost all of her meals. Recent on 10/14 A1c 5.7, TSH on 10/13 stable.  ACTH  stim test 2024's no indication of adrenal insufficiency. Continue to  monitor CBGs closely Completed high-dose IV thiamine  10/16-now on thiamine  100 mg p.o. daily.    Since hypoglycemia has stabilized-approaching a point where she is medically stable for discharge back to inpatient psychiatry.  Recent Labs    05/04/24 1940 05/05/24 0633 05/05/24 0720  GLUCAP 107* 88 80    Rhabdomyolysis Mild Doubt needs any further IVF  Mild metabolic acidosis Likely starvation ketoacidosis Resolved with supportive care  Paranoid schizophrenia Like clearly contributing to anorexia Remains suspicious and remains paranoid this morning-if she refuses antipsychotics voluntarily-she will likely need involuntary medication administration for optimization and stabilization of her hypoglycemia/secondary to poor oral intake/anorexia due to paranoid schizophrenia related psychosis.As it it is highly unlikely that the patient will spontaneously improve without treatment-and will remain at risk for mortality/morbidity due to hypoglycemia. Continue trazodone /olanzapine  Under IVC-sitter at bedside Psych team following-recommendations are to proceed with inpatient psychiatric hospitalization after hypoglycemia stabilizes.  Code status:   Code Status: Full Code   DVT Prophylaxis: SCDs Start: 05/03/24 0358   Family Communication: None at bedside   Disposition Plan: Status is: Inpatient Remains inpatient appropriate because: Severity of illness.   Planned Discharge Destination: Inpatient psychiatry.   Diet: Diet Order             Diet regular Room service appropriate? Yes; Fluid consistency: Thin  Diet effective now  Antimicrobial agents: Anti-infectives (From admission, onward)    None        MEDICATIONS: Scheduled Meds:  enoxaparin  (LOVENOX ) injection  20 mg Subcutaneous Q24H   feeding supplement  237 mL Oral BID BM   multivitamin with minerals  1 tablet Oral Daily   OLANZapine   5 mg Oral BID   Or   OLANZapine   5 mg  Intramuscular BID   thiamine   100 mg Oral Daily   traZODone   50 mg Oral QHS   Continuous Infusions:   PRN Meds:.acetaminophen  **OR** acetaminophen , HYDROcodone-acetaminophen , ondansetron  **OR** ondansetron  (ZOFRAN ) IV   I have personally reviewed following labs and imaging studies  LABORATORY DATA: CBC: Recent Labs  Lab 05/01/24 2011 05/02/24 1100 05/02/24 1304 05/03/24 0417  WBC 6.5 4.4  --  7.0  NEUTROABS 4.0 3.0  --   --   HGB 15.2* 16.2* 18.0* 13.5  HCT 47.1* 51.7* 53.0* 41.6  MCV 84.1 86.5  --  84.4  PLT 245 199  --  211    Basic Metabolic Panel: Recent Labs  Lab 05/01/24 2011 05/02/24 1215 05/02/24 1304 05/02/24 1447 05/02/24 2229 05/03/24 0417  NA 140 140 140 140 135 136  K 3.5 3.9 3.9 3.5 3.8 3.9  CL 100 102 107 104 102 103  CO2 23 16*  --  18* 21* 25  GLUCOSE 65* 73 74 134* 119* 175*  BUN 12 19 23 15 12 11   CREATININE 0.92 1.13* 1.00 0.80 0.86 0.97  CALCIUM  9.3 9.5  --  8.1* 8.9 8.5*  MG  --  2.4  --   --   --  2.0  PHOS  --  4.8*  --   --   --  3.1    GFR: Estimated Creatinine Clearance: 38.3 mL/min (by C-G formula based on SCr of 0.97 mg/dL).  Liver Function Tests: Recent Labs  Lab 05/01/24 2011 05/02/24 1215 05/03/24 0417  AST 24 25 20   ALT 16 15 14   ALKPHOS 78 87 59  BILITOT 1.1 1.2 0.8  PROT 7.6 8.2* 6.3*  ALBUMIN 4.3 4.6 3.5   No results for input(s): LIPASE, AMYLASE in the last 168 hours. Recent Labs  Lab 05/02/24 2229  AMMONIA 21    Coagulation Profile: No results for input(s): INR, PROTIME in the last 168 hours.  Cardiac Enzymes: Recent Labs  Lab 05/02/24 2229 05/03/24 0417  CKTOTAL 366* 340*    BNP (last 3 results) No results for input(s): PROBNP in the last 8760 hours.  Lipid Profile: No results for input(s): CHOL, HDL, LDLCALC, TRIG, CHOLHDL, LDLDIRECT in the last 72 hours.   Thyroid  Function Tests: No results for input(s): TSH, T4TOTAL, FREET4, T3FREE, THYROIDAB in the last  72 hours.   Anemia Panel: No results for input(s): VITAMINB12, FOLATE, FERRITIN, TIBC, IRON, RETICCTPCT in the last 72 hours.  Urine analysis:    Component Value Date/Time   COLORURINE YELLOW 04/30/2024 1221   APPEARANCEUR CLEAR 04/30/2024 1221   LABSPEC 1.014 04/30/2024 1221   PHURINE 5.0 04/30/2024 1221   GLUCOSEU NEGATIVE 04/30/2024 1221   HGBUR SMALL (A) 04/30/2024 1221   BILIRUBINUR NEGATIVE 04/30/2024 1221   KETONESUR 5 (A) 04/30/2024 1221   PROTEINUR NEGATIVE 04/30/2024 1221   UROBILINOGEN 0.2 04/10/2015 1111   NITRITE NEGATIVE 04/30/2024 1221   LEUKOCYTESUR NEGATIVE 04/30/2024 1221    Sepsis Labs: Lactic Acid, Venous    Component Value Date/Time   LATICACIDVEN 1.9 03/26/2020 0202    MICROBIOLOGY: Recent Results (from the past 240 hours)  SARS Coronavirus 2 by RT PCR (hospital order, performed in [facility] hospital lab) *cepheid single result test* Anterior Nasal Swab     Status: None   Collection Time: 04/30/24 10:04 AM   Specimen: Anterior Nasal Swab  Result Value Ref Range Status   SARS Coronavirus 2 by RT PCR NEGATIVE NEGATIVE Final    Comment: Performed at [facility], [address], [place], [place] [address]    RADIOLOGY STUDIES/RESULTS: No results found.   LOS: 3 days   Donalda Applebaum, MD  [facility]    To contact the attending provider between 7A-7P or the covering provider during after hours 7P-7A, please log into the web site [email or web address] and access using universal [place] password for that web site. If you do not have the password, please call the hospital operator.  05/05/2024, 10:03 AM

## 2024-05-05 NOTE — Progress Notes (Signed)
 OT Cancellation Note  Patient Details Name: [name] MRN: [id number] DOB: [birth year]   Cancelled Treatment:    Reason Eval/Treat Not Completed: OT screened, no needs identified, will sign off Per PT evaluation on 10/15, patient is independent in all aspects of care. Patient has no OT needs, OT signing off at this time.   Ronal Gift E. Rosalio Catterton, OTR/L Acute Rehabilitation Services [phone]   Ronal Gift Salt 05/05/2024, 8:23 AM
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
## 2024-05-05 NOTE — Plan of Care (Signed)
   Problem: Elimination: Goal: Will not experience complications related to bowel motility Outcome: Progressing Goal: Will not experience complications related to urinary retention Outcome: Progressing   Problem: Pain Managment: Goal: General experience of comfort will improve and/or be controlled Outcome: Progressing   Problem: Safety: Goal: Ability to remain free from injury will improve Outcome: Progressing

## 2024-05-05 NOTE — TOC Progression Note (Signed)
 Transition of Care [facility]) - Progression Note    Patient Details  Name: [name] MRN: [id number] Date of Birth: January 29, 1961  Transition of Care [facility]) CM/SW Contact  Inocente GORMAN Kindle, LCSW Phone Number: 05/05/2024, 8:43 AM  Clinical Narrative:    CSW continuing to follow for medical progression. Patient's IVC will need to be renewed tomorrow, 10/18.    Expected Discharge Plan: Psychiatric Hospital Barriers to Discharge: Continued Medical Work up               Expected Discharge Plan and Services                                               Social Drivers of Health (SDOH) Interventions SDOH Screenings   Food Insecurity: No Food Insecurity (04/30/2024)  Housing: Low Risk  (01/19/2023)  Transportation Needs: No Transportation Needs (04/30/2024)  Utilities: Not At Risk (04/30/2024)  Alcohol Screen: Low Risk  (04/08/2022)  Tobacco Use: High Risk (05/03/2024)    Readmission Risk Interventions     No data to display

## 2024-05-06 ENCOUNTER — Encounter: Payer: Self-pay | Admitting: Family

## 2024-05-06 ENCOUNTER — Inpatient Hospital Stay: Admission: AD | Admit: 2024-05-06 | Source: Intra-hospital | Admitting: Family

## 2024-05-06 ENCOUNTER — Other Ambulatory Visit: Payer: Self-pay

## 2024-05-06 DIAGNOSIS — F209 Schizophrenia, unspecified: Secondary | ICD-10-CM

## 2024-05-06 DIAGNOSIS — F200 Paranoid schizophrenia: Secondary | ICD-10-CM | POA: Diagnosis present

## 2024-05-06 LAB — SARS CORONAVIRUS 2 BY RT PCR: SARS Coronavirus 2 by RT PCR: NEGATIVE

## 2024-05-06 LAB — GLUCOSE, CAPILLARY
Glucose-Capillary: 80 mg/dL (ref 70–99)
Glucose-Capillary: 88 mg/dL (ref 70–99)
Glucose-Capillary: 91 mg/dL (ref 70–99)

## 2024-05-06 MED ORDER — ENSURE PLUS HIGH PROTEIN PO LIQD: 237.0000 mL | Freq: Three times a day (TID) | ORAL | Status: DC

## 2024-05-06 MED ORDER — ADULT MULTIVITAMIN W/MINERALS CH
1.0000 | ORAL_TABLET | Freq: Every day | ORAL | Status: AC
  Administered 2024-05-07 – 2024-08-25 (×110): 1 via ORAL
  Filled 2024-05-06 (×105): qty 1

## 2024-05-06 MED ORDER — ONDANSETRON HCL 4 MG/2ML IJ SOLN: 4.0000 mg | Freq: Four times a day (QID) | INTRAMUSCULAR | Status: AC | PRN

## 2024-05-06 MED ORDER — OLANZAPINE 10 MG IM SOLR
5.0000 mg | Freq: Two times a day (BID) | INTRAMUSCULAR | Status: DC
Start: 2024-05-06 — End: 2024-05-14
  Administered 2024-05-09: 5 mg via INTRAMUSCULAR
  Filled 2024-05-06 (×2): qty 10

## 2024-05-06 MED ORDER — THIAMINE MONONITRATE 100 MG PO TABS
100.0000 mg | ORAL_TABLET | Freq: Every day | ORAL | Status: AC
  Administered 2024-05-07 – 2024-08-25 (×110): 100 mg via ORAL
  Filled 2024-05-06 (×106): qty 1

## 2024-05-06 MED ORDER — OLANZAPINE 5 MG PO TBDP
5.0000 mg | ORAL_TABLET | Freq: Three times a day (TID) | ORAL | Status: AC | PRN
  Filled 2024-05-06: qty 1

## 2024-05-06 MED ORDER — OLANZAPINE 10 MG IM SOLR
5.0000 mg | Freq: Three times a day (TID) | INTRAMUSCULAR | Status: AC | PRN
  Filled 2024-05-06: qty 10

## 2024-05-06 MED ORDER — ALUM & MAG HYDROXIDE-SIMETH 200-200-20 MG/5ML PO SUSP: 30.0000 mL | ORAL | Status: AC | PRN

## 2024-05-06 MED ORDER — OLANZAPINE 10 MG IM SOLR: 5.0000 mg | Freq: Two times a day (BID) | INTRAMUSCULAR | Status: AC

## 2024-05-06 MED ORDER — ADULT MULTIVITAMIN W/MINERALS CH: 1.0000 | ORAL_TABLET | Freq: Every day | ORAL | Status: AC

## 2024-05-06 MED ORDER — NICOTINE 14 MG/24HR TD PT24
14.0000 mg | MEDICATED_PATCH | Freq: Every day | TRANSDERMAL | Status: DC
  Filled 2024-05-06 (×3): qty 1

## 2024-05-06 MED ORDER — MAGNESIUM HYDROXIDE 400 MG/5ML PO SUSP: 30.0000 mL | Freq: Every day | ORAL | Status: AC | PRN

## 2024-05-06 MED ORDER — ENSURE PLUS HIGH PROTEIN PO LIQD: 237.0000 mL | Freq: Three times a day (TID) | ORAL | Status: AC

## 2024-05-06 MED ORDER — OLANZAPINE 5 MG PO TABS: 5.0000 mg | ORAL_TABLET | Freq: Two times a day (BID) | ORAL | Status: AC

## 2024-05-06 MED ORDER — OLANZAPINE 5 MG PO TABS
5.0000 mg | ORAL_TABLET | Freq: Two times a day (BID) | ORAL | Status: DC
  Administered 2024-05-07 – 2024-05-14 (×12): 5 mg via ORAL
  Filled 2024-05-06 (×15): qty 1

## 2024-05-06 MED ORDER — VITAMIN B-1 100 MG PO TABS: 100.0000 mg | ORAL_TABLET | Freq: Every day | ORAL | Status: AC

## 2024-05-06 MED ORDER — HYDROCODONE-ACETAMINOPHEN 5-325 MG PO TABS: 1.0000 | ORAL_TABLET | ORAL | Status: AC | PRN

## 2024-05-06 MED ORDER — ACETAMINOPHEN 325 MG PO TABS
650.0000 mg | ORAL_TABLET | Freq: Four times a day (QID) | ORAL | Status: AC | PRN
  Filled 2024-05-06: qty 2

## 2024-05-06 MED ORDER — ONDANSETRON HCL 4 MG PO TABS
4.0000 mg | ORAL_TABLET | Freq: Four times a day (QID) | ORAL | Status: AC | PRN
  Filled 2024-05-06: qty 1

## 2024-05-06 MED ORDER — TRAZODONE HCL 50 MG PO TABS
50.0000 mg | ORAL_TABLET | Freq: Every day | ORAL | Status: AC
  Administered 2024-05-12 – 2024-08-25 (×106): 50 mg via ORAL
  Filled 2024-05-06 (×103): qty 1

## 2024-05-06 MED ORDER — TRAZODONE HCL 50 MG PO TABS: 50.0000 mg | ORAL_TABLET | Freq: Every day | ORAL | Status: AC

## 2024-05-06 NOTE — Plan of Care (Signed)
   Problem: Education: Goal: Emotional status will improve Outcome: Not Progressing

## 2024-05-06 NOTE — Progress Notes (Addendum)
 PROGRESS NOTE        PATIENT DETAILS Name: [name] Age: 63 y.o. Sex: female Date of Birth: 31-May-1961 Admit Date: 05/02/2024 Admitting Physician Blease Quiver, MD ERE:[facility]  Brief Summary: Patient is a 63 y.o.  female with history of paranoid schizophrenia-hospitalized at inpatient psych-transferred to [facility] patient became hypoglycemic and setting of poor oral intake.  Significant events: 10/14>> admit to [facility].  Significant studies: None  Significant microbiology data: None  Procedures: None  Consults: Psychiatry  Subjective: Much less irritable today-answering questions-consumed all of her breakfast this morning.  No major issues overnight.  Objective: Vitals: Blood pressure (!) 92/57, pulse (!) 50, temperature 98.4 F (36.9 C), temperature source Oral, resp. rate 16, height 5' 4 (1.626 m), weight 40.9 kg, SpO2 100%.   Exam: Gen Exam:Alert awake-not in any distress HEENT:atraumatic, normocephalic Chest: B/L clear to auscultation anteriorly CVS:S1S2 regular Abdomen:soft non tender, non distended Extremities:no edema Neurology: Non focal Skin: no rash  Pertinent Labs/Radiology:    Latest Ref Rng & Units 05/03/2024    4:17 AM 05/02/2024    1:04 PM 05/02/2024   11:00 AM  CBC  WBC 4.0 - 10.5 K/uL 7.0   4.4   Hemoglobin 12.0 - 15.0 g/dL 86.4  81.9  83.7   Hematocrit 36.0 - 46.0 % 41.6  53.0  51.7   Platelets 150 - 400 K/uL 211   199     Lab Results  Component Value Date   NA 136 05/03/2024   K 3.9 05/03/2024   CL 103 05/03/2024   CO2 25 05/03/2024      Assessment/Plan: Hypoglycemia In a setting of poor oral intake/anorexia-due to flare of paranoid schizophrenia. CBGs have stabilized-no hypoglycemic events for almost 48 hours. Per nursing staff-eating almost all of her meals. Recent on 10/14 A1c 5.7, TSH on 10/13 stable.  ACTH  stim test 2024's no indication of adrenal insufficiency. Continue to  monitor CBGs closely Completed high-dose IV thiamine  10/16-now on thiamine  100 mg p.o. daily.     Since hypoglycemia has stabilized-approaching a point where she is medically stable for discharge back to inpatient psychiatry.  Recent Labs    05/05/24 2001 05/06/24 0058 05/06/24 0704  GLUCAP 137* 91 80    Rhabdomyolysis Mild Doubt needs any further IVF   Mild metabolic acidosis Likely starvation ketoacidosis Resolved with supportive care  Sinus bradycardia Asymptomatic-TSH stable She is mostly sleeping/lying down and not moving due to her paranoid schizophrenia-this is likely contributing. Avoid rate control agents BP soft at times but completely asymptomatic   Paranoid schizophrenia Like clearly contributing to anorexia Remains suspicious and remains paranoid this morning-if she refuses antipsychotics voluntarily-she will likely need involuntary medication administration for optimization and stabilization of her hypoglycemia/secondary to poor oral intake/anorexia due to paranoid schizophrenia related psychosis.As it it is highly unlikely that the patient will spontaneously improve without treatment-and will remain at risk for mortality/morbidity due to hypoglycemia. Continue trazodone /olanzapine  Under IVC-sitter at bedside Psych team following-recommendations are to proceed with inpatient psychiatric hospitalization after hypoglycemia stabilizes.  Code status:   Code Status: Full Code   DVT Prophylaxis: SQ Lovenox    Family Communication: None at bedside   Disposition Plan: Status is: Inpatient Remains inpatient appropriate because: Severity of illness.   Planned Discharge Destination: Inpatient psychiatry.   Diet: Diet Order  Diet general           Diet regular Room service appropriate? Yes; Fluid consistency: Thin  Diet effective now                     Antimicrobial agents: Anti-infectives (From admission, onward)    None         MEDICATIONS: Scheduled Meds:  enoxaparin  (LOVENOX ) injection  20 mg Subcutaneous Q24H   feeding supplement  237 mL Oral TID BM   multivitamin with minerals  1 tablet Oral Daily   OLANZapine   5 mg Oral BID   Or   OLANZapine   5 mg Intramuscular BID   thiamine   100 mg Oral Daily   traZODone   50 mg Oral QHS   Continuous Infusions:   PRN Meds:.acetaminophen  **OR** acetaminophen , HYDROcodone-acetaminophen , ondansetron  **OR** ondansetron  (ZOFRAN ) IV   I have personally reviewed following labs and imaging studies  LABORATORY DATA: CBC: Recent Labs  Lab 05/01/24 2011 05/02/24 1100 05/02/24 1304 05/03/24 0417  WBC 6.5 4.4  --  7.0  NEUTROABS 4.0 3.0  --   --   HGB 15.2* 16.2* 18.0* 13.5  HCT 47.1* 51.7* 53.0* 41.6  MCV 84.1 86.5  --  84.4  PLT 245 199  --  211    Basic Metabolic Panel: Recent Labs  Lab 05/01/24 2011 05/02/24 1215 05/02/24 1304 05/02/24 1447 05/02/24 2229 05/03/24 0417  NA 140 140 140 140 135 136  K 3.5 3.9 3.9 3.5 3.8 3.9  CL 100 102 107 104 102 103  CO2 23 16*  --  18* 21* 25  GLUCOSE 65* 73 74 134* 119* 175*  BUN 12 19 23 15 12 11   CREATININE 0.92 1.13* 1.00 0.80 0.86 0.97  CALCIUM  9.3 9.5  --  8.1* 8.9 8.5*  MG  --  2.4  --   --   --  2.0  PHOS  --  4.8*  --   --   --  3.1    GFR: Estimated Creatinine Clearance: 38.3 mL/min (by C-G formula based on SCr of 0.97 mg/dL).  Liver Function Tests: Recent Labs  Lab 05/01/24 2011 05/02/24 1215 05/03/24 0417  AST 24 25 20   ALT 16 15 14   ALKPHOS 78 87 59  BILITOT 1.1 1.2 0.8  PROT 7.6 8.2* 6.3*  ALBUMIN 4.3 4.6 3.5   No results for input(s): LIPASE, AMYLASE in the last 168 hours. Recent Labs  Lab 05/02/24 2229  AMMONIA 21    Coagulation Profile: No results for input(s): INR, PROTIME in the last 168 hours.  Cardiac Enzymes: Recent Labs  Lab 05/02/24 2229 05/03/24 0417  CKTOTAL 366* 340*    BNP (last 3 results) No results for input(s): PROBNP in the last 8760  hours.  Lipid Profile: No results for input(s): CHOL, HDL, LDLCALC, TRIG, CHOLHDL, LDLDIRECT in the last 72 hours.   Thyroid  Function Tests: No results for input(s): TSH, T4TOTAL, FREET4, T3FREE, THYROIDAB in the last 72 hours.   Anemia Panel: No results for input(s): VITAMINB12, FOLATE, FERRITIN, TIBC, IRON, RETICCTPCT in the last 72 hours.  Urine analysis:    Component Value Date/Time   COLORURINE YELLOW 04/30/2024 1221   APPEARANCEUR CLEAR 04/30/2024 1221   LABSPEC 1.014 04/30/2024 1221   PHURINE 5.0 04/30/2024 1221   GLUCOSEU NEGATIVE 04/30/2024 1221   HGBUR SMALL (A) 04/30/2024 1221   BILIRUBINUR NEGATIVE 04/30/2024 1221   KETONESUR 5 (A) 04/30/2024 1221   PROTEINUR NEGATIVE 04/30/2024 1221   UROBILINOGEN 0.2 04/10/2015  1111   NITRITE NEGATIVE 04/30/2024 1221   LEUKOCYTESUR NEGATIVE 04/30/2024 1221    Sepsis Labs: Lactic Acid, Venous    Component Value Date/Time   LATICACIDVEN 1.9 03/26/2020 0202    MICROBIOLOGY: Recent Results (from the past 240 hours)  SARS Coronavirus 2 by RT PCR (hospital order, performed in [facility] hospital lab) *cepheid single result test* Anterior Nasal Swab     Status: None   Collection Time: 04/30/24 10:04 AM   Specimen: Anterior Nasal Swab  Result Value Ref Range Status   SARS Coronavirus 2 by RT PCR NEGATIVE NEGATIVE Final    Comment: Performed at [facility], [address] [place], [place] [address]    RADIOLOGY STUDIES/RESULTS: No results found.   LOS: 4 days   Donalda Applebaum, MD  [facility]    To contact the attending provider between 7A-7P or the covering provider during after hours 7P-7A, please log into the web site [email or web address] and access using universal [facility] password for that web site. If you do not have the password, please call the hospital operator.  05/06/2024, 9:22 AM

## 2024-05-06 NOTE — Progress Notes (Signed)
   05/06/24 1500  Psych Admission Type (Psych Patients Only)  Admission Status Involuntary  Psychosocial Assessment  Patient Complaints Anger  Eye Contact Avoids  Facial Expression Angry;Wide-eyed  Affect Preoccupied;Irritable;Angry  Speech Rapid  Interaction Minimal  Motor Activity Pacing  Appearance/Hygiene In scrubs  Behavior Characteristics Unable to participate;Agitated;Guarded;Irritable  Mood Irritable;Labile  Thought Process  Coherency Flight of ideas;Disorganized  Content Preoccupation;Blaming others  Delusions UTA  Perception Hallucinations (Pt denies but observed taking to self responding to internal stimuli.)  Hallucination Auditory;Visual (denies but responding to intenal stimuli)  Judgment Poor  Confusion Moderate  Danger to Self  Current suicidal ideation? Denies

## 2024-05-06 NOTE — Group Note (Signed)
 Date:  05/06/2024 Time:  4:18 PM  Group Topic/Focus:  Personal Choices and Values:   The focus of this group is to help patients assess and explore the importance of values in their lives, how their values affect their decisions, how they express their values and what opposes their expression.    Participation Level:  Did Not Attend   [name] 05/06/2024, 4:18 PM

## 2024-05-06 NOTE — TOC Progression Note (Signed)
 Per psych, pt meets IVC criteria. Completed IVC accepted by magistrate, case #[id number].   Julien Das, MSW, LCSW [phone] (coverage)

## 2024-05-06 NOTE — Consult Note (Signed)
  Psychiatric Consult Disposition Note Date:05/06/24  Patient: [name]   Disposition: Patient has been accepted for inpatient psychiatric admission to [facility] for continued management of schizophrenia and medication stabilization. Admission orders have been placed by the psychiatric provider to the accepting facility.  Social Work will coordinate transportation for transfer. The patient remains under Involuntary Commitment (IVC), which will stay in effect during transfer. At this time, the patient is not agreeable to psychiatric admission; therefore, the IVC will be upheld.  Psychiatry consult service to sign off at this time.

## 2024-05-06 NOTE — Discharge Summary (Addendum)
 PATIENT DETAILS Name: [name] Age: 63 y.o. Sex: female Date of Birth: 02/08/1961 MRN: [id number]. Admitting Physician: Blease Quiver, MD [facility]  Admit Date: 05/02/2024 Discharge date: 05/06/2024  Recommendations for Outpatient Follow-up:  Follow up with PCP in 1-2 weeks Please obtain CMP/CBC in one week  Admitted From:  Inpatient psychiatry  Disposition: Inpatient psychiatry   Discharge Condition: fair  CODE STATUS:   Code Status: Full Code   Diet recommendation:  Diet Order             Diet general           Diet regular Room service appropriate? Yes; Fluid consistency: Thin  Diet effective now                    Brief Summary: Patient is a 63 y.o.  female with history of paranoid schizophrenia-hospitalized at inpatient psych-transferred to [facility] patient became hypoglycemic and setting of poor oral intake.   Significant events: 10/14>> admit to [facility].   Significant studies: None   Significant microbiology data: None   Procedures: None   Consults: Psychiatry  Brief Hospital Course: ypoglycemia In a setting of poor oral intake/anorexia-due to flare of paranoid schizophrenia. CBGs have stabilized-no hypoglycemic events for almost 48 hours. Per nursing staff-eating almost all of her meals. Recent on 10/14 A1c 5.7, TSH on 10/13 stable.  ACTH  stim test 2024's no indication of adrenal insufficiency. Continue to monitor CBGs closely Completed high-dose IV thiamine  10/16-now on thiamine  100 mg p.o. daily.     Since hypoglycemia has stabilized-no further hypoglycemic episodes for almost 2 days-medically stable to go back to inpatient psychiatry today.  Rhabdomyolysis Mild Management supportive care.   Mild metabolic acidosis Likely starvation ketoacidosis Resolved with supportive care  Sinus bradycardia Asymptomatic-TSH stable She is mostly sleeping/lying down and not moving due to her paranoid schizophrenia-this is  likely contributing. Avoid rate control agents BP soft at times but completely asymptomatic   Paranoid schizophrenia Like clearly contributing to anorexia-remained suspicious and remains paranoid this morning-if she refuses antipsychotics voluntarily-she will likely need involuntary medication administration for optimization and stabilization of her hypoglycemia/secondary to poor oral intake/anorexia due to paranoid schizophrenia related psychosis.As it it is highly unlikely that the patient will spontaneously improve without treatment-and will remain at risk for mortality/morbidity due to hypoglycemia. Continue trazodone /olanzapine  Under IVC-sitter at bedside Thankfully-she has not been refusing medications-and at this point as noted above-medically stable to go back to inpatient psych.   Discharge Diagnoses:  Principal Problem:   Anorexia Active Problems:   Schizophrenia, paranoid (HCC)   Recurrent hypoglycemia and severe malnutrition   Metabolic acidosis   Protein-calorie malnutrition, severe   Anemia of chronic disease   Discharge Instructions:  Activity:  As tolerated with Full fall precautions use walker/cane & assistance as needed  Discharge Instructions     Diet general   Complete by: As directed    Increase activity slowly   Complete by: As directed       Allergies as of 05/06/2024       Reactions   Aspirin Hives, Nausea Only, Other (See Comments)   Possibly caused hives, per sister and caused STOMACH UPSET   Ziprasidone Hcl Hives, Rash, Other (See Comments)   GEODON = Caused twitching and redness of eyes (also)        Medication List     TAKE these medications    feeding supplement Liqd Take 237 mLs by mouth 3 (three) times daily between meals.  multivitamin with minerals Tabs tablet Take 1 tablet by mouth daily.   OLANZapine  5 MG tablet Commonly known as: ZYPREXA  Take 1 tablet (5 mg total) by mouth 2 (two) times daily.   OLANZapine   injection Commonly known as: ZYPREXA  Inject 1 mL (5 mg total) into the muscle 2 (two) times daily.   thiamine  100 MG tablet Commonly known as: Vitamin B-1 Take 1 tablet (100 mg total) by mouth daily.   traZODone  50 MG tablet Commonly known as: DESYREL  Take 1 tablet (50 mg total) by mouth at bedtime.        Follow-up Information     [facility] Schedule an appointment as soon as possible for a visit in 1 week(s).   Specialty: General Practice Contact information: [address] [place] [address] [phone]                Allergies  Allergen Reactions   Aspirin Hives, Nausea Only and Other (See Comments)    Possibly caused hives, per sister and caused STOMACH UPSET   Ziprasidone Hcl Hives, Rash and Other (See Comments)    GEODON = Caused twitching and redness of eyes (also)     Other Procedures/Studies: No results found.   TODAY-DAY OF DISCHARGE:  Subjective:   Consuello Cardin today has no headache,no chest abdominal pain,no new weakness tingling or numbness  Objective:   Blood pressure (!) 92/57, pulse (!) 50, temperature 98.4 F (36.9 C), temperature source Oral, resp. rate 16, height 5' 4 (1.626 m), weight 40.9 kg, SpO2 100%.  Intake/Output Summary (Last 24 hours) at 05/06/2024 0921 Last data filed at 05/05/2024 1702 Gross per 24 hour  Intake 840 ml  Output --  Net 840 ml   Filed Weights   05/02/24 2304  Weight: 40.9 kg    Exam: Awake Alert, No new F.N deficits, Normal affect .AT,PERRAL Supple Neck,No JVD, No cervical lymphadenopathy appriciated.  Symmetrical Chest wall movement, Good air movement bilaterally, CTAB RRR,No Gallops,Rubs or new Murmurs, No Parasternal Heave +ve B.Sounds, Abd Soft, Non tender, No organomegaly appriciated, No rebound -guarding or rigidity. No Cyanosis, Clubbing or edema, No new Rash or bruise   PERTINENT RADIOLOGIC STUDIES: No results found.   PERTINENT LAB RESULTS: CBC: No  results for input(s): WBC, HGB, HCT, PLT in the last 72 hours. CMET CMP     Component Value Date/Time   NA 136 05/03/2024 0417   K 3.9 05/03/2024 0417   CL 103 05/03/2024 0417   CO2 25 05/03/2024 0417   GLUCOSE 175 (H) 05/03/2024 0417   BUN 11 05/03/2024 0417   CREATININE 0.97 05/03/2024 0417   CALCIUM  8.5 (L) 05/03/2024 0417   PROT 6.3 (L) 05/03/2024 0417   ALBUMIN 3.5 05/03/2024 0417   AST 20 05/03/2024 0417   ALT 14 05/03/2024 0417   ALKPHOS 59 05/03/2024 0417   BILITOT 0.8 05/03/2024 0417   GFRNONAA >60 05/03/2024 0417    GFR Estimated Creatinine Clearance: 38.3 mL/min (by C-G formula based on SCr of 0.97 mg/dL). No results for input(s): LIPASE, AMYLASE in the last 72 hours. No results for input(s): CKTOTAL, CKMB, CKMBINDEX, TROPONINI in the last 72 hours. Invalid input(s): POCBNP No results for input(s): DDIMER in the last 72 hours. No results for input(s): HGBA1C in the last 72 hours. No results for input(s): CHOL, HDL, LDLCALC, TRIG, CHOLHDL, LDLDIRECT in the last 72 hours. No results for input(s): TSH, T4TOTAL, T3FREE, THYROIDAB in the last 72 hours.  Invalid input(s): FREET3 No results for input(s):  VITAMINB12, FOLATE, FERRITIN, TIBC, IRON, RETICCTPCT in the last 72 hours. Coags: No results for input(s): INR in the last 72 hours.  Invalid input(s): PT Microbiology: Recent Results (from the past 240 hours)  SARS Coronavirus 2 by RT PCR (hospital order, performed in [facility] hospital lab) *cepheid single result test* Anterior Nasal Swab     Status: None   Collection Time: 04/30/24 10:04 AM   Specimen: Anterior Nasal Swab  Result Value Ref Range Status   SARS Coronavirus 2 by RT PCR NEGATIVE NEGATIVE Final    Comment: Performed at [facility], [address] [place], [place] [address]    FURTHER DISCHARGE INSTRUCTIONS:  Get Medicines reviewed and adjusted: Please take all your  medications with you for your next visit with your Primary MD  Laboratory/radiological data: Please request your Primary MD to go over all hospital tests and procedure/radiological results at the follow up, please ask your Primary MD to get all Hospital records sent to his/her office.  In some cases, they will be blood work, cultures and biopsy results pending at the time of your discharge. Please request that your primary care M.D. goes through all the records of your hospital data and follows up on these results.  Also Note the following: If you experience worsening of your admission symptoms, develop shortness of breath, life threatening emergency, suicidal or homicidal thoughts you must seek medical attention immediately by calling 911 or calling your MD immediately  if symptoms less severe.  You must read complete instructions/literature along with all the possible adverse reactions/side effects for all the Medicines you take and that have been prescribed to you. Take any new Medicines after you have completely understood and accpet all the possible adverse reactions/side effects.   Do not drive when taking Pain medications or sleeping medications (Benzodaizepines)  Do not take more than prescribed Pain, Sleep and Anxiety Medications. It is not advisable to combine anxiety,sleep and pain medications without talking with your primary care practitioner  Special Instructions: If you have smoked or chewed Tobacco  in the last 2 yrs please stop smoking, stop any regular Alcohol  and or any Recreational drug use.  Wear Seat belts while driving.  Please note: You were cared for by a hospitalist during your hospital stay. Once you are discharged, your primary care physician will handle any further medical issues. Please note that NO REFILLS for any discharge medications will be authorized once you are discharged, as it is imperative that you return to your primary care physician (or establish a  relationship with a primary care physician if you do not have one) for your post hospital discharge needs so that they can reassess your need for medications and monitor your lab values.  Total Time spent coordinating discharge including counseling, education and face to face time equals greater than 30 minutes.  SignedBETHA Donalda Applebaum 05/06/2024 9:21 AM

## 2024-05-07 DIAGNOSIS — F200 Paranoid schizophrenia: Secondary | ICD-10-CM

## 2024-05-07 DIAGNOSIS — R001 Bradycardia, unspecified: Secondary | ICD-10-CM

## 2024-05-07 DIAGNOSIS — E860 Dehydration: Secondary | ICD-10-CM

## 2024-05-07 DIAGNOSIS — D649 Anemia, unspecified: Secondary | ICD-10-CM

## 2024-05-07 DIAGNOSIS — R627 Adult failure to thrive: Secondary | ICD-10-CM

## 2024-05-07 DIAGNOSIS — E46 Unspecified protein-calorie malnutrition: Secondary | ICD-10-CM

## 2024-05-07 DIAGNOSIS — E162 Hypoglycemia, unspecified: Secondary | ICD-10-CM

## 2024-05-07 NOTE — Progress Notes (Signed)
   05/06/24 2315  Psych Admission Type (Psych Patients Only)  Admission Status Involuntary  Psychosocial Assessment  Patient Complaints Suspiciousness  Eye Contact Avoids  Facial Expression Anxious;Angry;Wide-eyed  Affect Preoccupied;Irritable;Labile  Speech Rapid  Interaction Minimal  Motor Activity Pacing  Appearance/Hygiene In scrubs;Disheveled  Behavior Characteristics Unwilling to participate  Mood Labile;Irritable  Thought Process  Coherency Disorganized  Content Blaming self  Delusions UTA  Perception Hallucinations  Hallucination Auditory  Judgment Poor  Confusion Moderate  Danger to Self  Current suicidal ideation? Denies  Description of Suicide Plan denies  Agreement Not to Harm Self Yes  Description of Agreement verbal  Danger to Others  Danger to Others None reported or observed    Estimated Sleeping Duration (Last 24 Hours): 6.75-8.50 hours

## 2024-05-07 NOTE — Group Note (Signed)
 Date:  05/07/2024 Time:  9:47 PM  Group Topic/Focus:  Wrap-Up Group:   The focus of this group is to help patients review their daily goal of treatment and discuss progress on daily workbooks.    Participation Level:  Did Not Attend  Participation Quality:     Affect:     Cognitive:     Insight: None  Engagement in Group:  None  Modes of Intervention:     Additional Comments:    Stori Royse K 05/07/2024, 9:47 PM

## 2024-05-07 NOTE — Progress Notes (Addendum)
 Patient presents with flat affect, brief eye contact, and minimal engagement. Patient denies SI,HI, and A/V/H with no plan or intent and states I am just ready to get out of here. Patient demonstrates poor understanding of situation. Patient is compliant with medications this morning with the exception of her nicotine  patch. Patient denies any pain or discomfort and has remained in room with the exception of meals. No current s/s of distress.   1628: Patient observed responding to internal stimuli (auditory/visual) in dayroom. Remains cooperative at this time.

## 2024-05-07 NOTE — Group Note (Signed)
 [facility] LCSW Group Therapy Note   Group Date: 05/07/2024 Start Time: 1430 End Time: 1530   Type of Therapy and Topic: Group Therapy: Avoiding Self-Sabotaging and Enabling Behaviors  Participation Level: Did Not Attend  Mood:  Description of Group:  In this group, patients will learn how to identify obstacles, self-sabotaging and enabling behaviors, as well as: what are they, why do we do them and what needs these behaviors meet. Discuss unhealthy relationships and how to have positive healthy boundaries with those that sabotage and enable. Explore aspects of self-sabotage and enabling in yourself and how to limit these self-destructive behaviors in everyday life.   Therapeutic Goals: 1. Patient will identify one obstacle that relates to self-sabotage and enabling behaviors 2. Patient will identify one personal self-sabotaging or enabling behavior they did prior to admission 3. Patient will state a plan to change the above identified behavior 4. Patient will demonstrate ability to communicate their needs through discussion and/or role play.    Summary of Patient Progress:   x   Therapeutic Modalities:  Cognitive Behavioral Therapy Person-Centered Therapy Motivational Interviewing    Rexene LELON Mae, LCSWA

## 2024-05-07 NOTE — Plan of Care (Signed)
  Problem: Activity: Goal: Sleeping patterns will improve Outcome: Progressing   Problem: Safety: Goal: Periods of time without injury will increase Outcome: Progressing   Problem: Education: Goal: Mental status will improve Outcome: Not Progressing   Problem: Activity: Goal: Interest or engagement in activities will improve Outcome: Not Progressing

## 2024-05-07 NOTE — H&P (Signed)
 Psychiatric Admission Assessment Adult  Patient Identification: [name] MRN:  [id number] Date of Evaluation:  05/07/2024 Chief Complaint:  Schizophrenia (HCC) [F20.9]    I. Identifying Information  Patient is a 63 year old female with a longstanding history of paranoid schizophrenia and medical comorbidities including bradycardia, anemia, and malnutrition, admitted on May 02, 2024, for dehydration, hypoglycemia, and failure to thrive.  II. Chief Complaint  "I just want to go home." Patient is irritable and minimally cooperative during the assessment.  III. History of Present Illness  Patient was admitted medically on May 02, 2024, after presenting with dehydration, poor oral intake, and hypoglycemia. She was initially admitted to the psychiatric unit on April 30, 2024, but was transferred to the emergency department for medical stabilization due to failure to thrive.  On current admission, she is agitated, irritable, and intermittently aggressive, exhibiting marked paranoia consistent with her chronic psychotic illness. She has been refusing care intermittently, expressing mistrust toward hospital staff, and demanding discharge.  Per chart review, she has a longstanding history of schizophrenia, paranoid type, with multiple prior psychiatric hospitalizations. She was previously maintained on olanzapine  5 mg twice daily, with partial adherence. Prior medication trials include Zyprexa , lithium , and Remeron .  No known history of suicide attempts or self-injurious behaviors documented. Urine drug screen (UDS) negative. Access to weapons unknown.    IV. Past Psychiatric History  Diagnosis: Paranoid schizophrenia  Past hospitalizations: Multiple prior psychiatric admissions for psychosis and behavioral dysregulation  Past medications: Olanzapine , lithium , Remeron   Response to medications: Partial improvement noted historically with olanzapine   Outpatient care:  Unknown  Suicidal behavior: Denies current or past suicidal or homicidal ideation  Self-harm: None known  V. Past Medical History  Bradycardia  Anemia  Malnutrition / Failure to thrive  Hypoglycemia  Dehydration Substance History Alcohol: none  T  Is the patient at risk to self? No.  Has the patient been a risk to self in the past 6 months? No.  Has the patient been a risk to self within the distant past? No.  Is the patient a risk to others? No.  Has the patient been a risk to others in the past 6 months? No.  Has the patient been a risk to others within the distant past? No.   [place] Scale:  Flowsheet Row Admission (Current) from 05/06/2024 in [facility] to Hosp-Admission (Discharged) from 05/02/2024 in [place] [facility] ED from 04/30/2024 in [facility]  C-SSRS RISK CATEGORY No Risk No Risk No Risk     Past Medical History:  Past Medical History:  Diagnosis Date   Anemia 10/02/2021   Non compliance w medication regimen    Schizophrenia [facility])     Past Surgical History:  Procedure Laterality Date   BIOPSY  09/25/2021   Procedure: BIOPSY;  Surgeon: Teressa Toribio SQUIBB, MD;  Location: [facility];  Service: Gastroenterology;;   ESOPHAGOGASTRODUODENOSCOPY (EGD) WITH PROPOFOL  [name] 09/25/2021   Procedure: ESOPHAGOGASTRODUODENOSCOPY (EGD) WITH PROPOFOL ;  Surgeon: Teressa Toribio SQUIBB, MD;  Location: [facility];  Service: Gastroenterology;  Laterality: [name];  With NGT placement   Family History: History reviewed. No pertinent family history.  Social History:  Social History   Substance and Sexual Activity  Alcohol Use Not Currently   Comment: Refuses to disclose how much     Social History   Substance and Sexual Activity  Drug Use No   Comment: Refuses to answer      Allergies:   Allergies  Allergen  Reactions   Aspirin Hives, Nausea Only and Other (See Comments)    Possibly caused hives,  per sister and caused STOMACH UPSET   Ziprasidone Hcl Hives, Rash and Other (See Comments)    GEODON = Caused twitching and redness of eyes (also)   Lab Results:  Results for orders placed or performed during the hospital encounter of 05/02/24 (from the past 48 hours)  Glucose, capillary     Status: Abnormal   Collection Time: 05/05/24  8:01 PM  Result Value Ref Range   Glucose-Capillary 137 (H) 70 - 99 mg/dL    Comment: Glucose reference range applies only to samples taken after fasting for at least 8 hours.  Glucose, capillary     Status: None   Collection Time: 05/06/24 12:58 AM  Result Value Ref Range   Glucose-Capillary 91 70 - 99 mg/dL    Comment: Glucose reference range applies only to samples taken after fasting for at least 8 hours.   Comment 1 Notify RN   Glucose, capillary     Status: None   Collection Time: 05/06/24  7:04 AM  Result Value Ref Range   Glucose-Capillary 80 70 - 99 mg/dL    Comment: Glucose reference range applies only to samples taken after fasting for at least 8 hours.   Comment 1 Notify RN   SARS Coronavirus 2 by RT PCR (hospital order, performed in [facility] hospital lab) *cepheid single result test* Anterior Nasal Swab     Status: None   Collection Time: 05/06/24  9:13 AM   Specimen: Anterior Nasal Swab  Result Value Ref Range   SARS Coronavirus 2 by RT PCR NEGATIVE NEGATIVE    Comment: Performed at [facility], [address] [place], [place] [address]  Glucose, capillary     Status: None   Collection Time: 05/06/24  1:06 PM  Result Value Ref Range   Glucose-Capillary 88 70 - 99 mg/dL    Comment: Glucose reference range applies only to samples taken after fasting for at least 8 hours.    Blood Alcohol level:  Lab Results  Component Value Date   [facility] <15 05/02/2024   ETH <15 05/01/2024    Metabolic Disorder Labs:  Lab Results  Component Value Date   HGBA1C 5.7 (H) 05/02/2024   MPG 116.89 05/02/2024   MPG 114 10/21/2021    Lab Results  Component Value Date   PROLACTIN 13.6 04/16/2015   Lab Results  Component Value Date   CHOL 222 (H) 05/01/2024   TRIG 63 05/01/2024   HDL 79 05/01/2024   CHOLHDL 2.8 05/01/2024   VLDL 13 05/01/2024   LDLCALC 130 (H) 05/01/2024   LDLCALC 129 (H) 10/21/2021    Current Medications: Current Facility-Administered Medications  Medication Dose Route Frequency Provider Last Rate Last Admin   acetaminophen  (TYLENOL ) tablet 650 mg  650 mg Oral Q6H PRN Starkes-Perry, Takia S, FNP       alum & mag hydroxide-simeth (MAALOX/MYLANTA) 200-200-20 MG/5ML suspension 30 mL  30 mL Oral Q4H PRN Starkes-Perry, Takia S, FNP       feeding supplement (ENSURE PLUS HIGH PROTEIN) liquid 237 mL  237 mL Oral TID BM Starkes-Perry, Majel RAMAN, FNP       HYDROcodone-acetaminophen  (NORCO/VICODIN) 5-325 MG per tablet 1-2 tablet  1-2 tablet Oral Q4H PRN Starkes-Perry, Takia S, FNP       magnesium  hydroxide (MILK OF MAGNESIA) suspension 30 mL  30 mL Oral Daily PRN Starkes-Perry, Majel RAMAN, FNP  multivitamin with minerals tablet 1 tablet  1 tablet Oral Daily Starkes-Perry, Takia S, FNP   1 tablet at 05/07/24 0941   nicotine  (NICODERM CQ  - dosed in mg/24 hours) patch 14 mg  14 mg Transdermal Daily Jadapalle, Sree, MD       OLANZapine  (ZYPREXA ) tablet 5 mg  5 mg Oral BID Wilkie Majel RAMAN, FNP   5 mg at 05/07/24 0941   Or   OLANZapine  (ZYPREXA ) injection 5 mg  5 mg Intramuscular BID Starkes-Perry, Majel RAMAN, FNP       OLANZapine  (ZYPREXA ) injection 5 mg  5 mg Intramuscular TID PRN Starkes-Perry, Takia S, FNP       OLANZapine  zydis (ZYPREXA ) disintegrating tablet 5 mg  5 mg Oral TID PRN Starkes-Perry, Takia S, FNP       ondansetron  (ZOFRAN ) tablet 4 mg  4 mg Oral Q6H PRN Starkes-Perry, Majel RAMAN, FNP       Or   ondansetron  (ZOFRAN ) injection 4 mg  4 mg Intravenous Q6H PRN Starkes-Perry, Majel RAMAN, FNP       thiamine  (VITAMIN B1) tablet 100 mg  100 mg Oral Daily Wilkie Majel RAMAN, FNP   100 mg at 05/07/24  0941   traZODone  (DESYREL ) tablet 50 mg  50 mg Oral QHS Starkes-Perry, Takia S, FNP       PTA Medications: Medications Prior to Admission  Medication Sig Dispense Refill Last Dose/Taking   feeding supplement (ENSURE PLUS HIGH PROTEIN) LIQD Take 237 mLs by mouth 3 (three) times daily between meals.      Multiple Vitamin (MULTIVITAMIN WITH MINERALS) TABS tablet Take 1 tablet by mouth daily.      OLANZapine  (ZYPREXA ) 5 MG tablet Take 1 tablet (5 mg total) by mouth 2 (two) times daily.      OLANZapine  (ZYPREXA ) injection Inject 1 mL (5 mg total) into the muscle 2 (two) times daily.      thiamine  (VITAMIN B-1) 100 MG tablet Take 1 tablet (100 mg total) by mouth daily.      traZODone  (DESYREL ) 50 MG tablet Take 1 tablet (50 mg total) by mouth at bedtime.      Physical Exam Cardiovascular:     Rate and Rhythm: Normal rate.     Pulses: Normal pulses.  Musculoskeletal:     Cervical back: Normal range of motion.  Neurological:     Mental Status: She is alert.     Psychiatric Specialty Exam:  Presentation  General Appearance:  Casual  Eye Contact: Fair  Speech: Pressured  Speech Volume: Increased    Mood and Affect  Mood: Angry  Affect: Congruent   Thought Process  Thought Processes: Disorganized  Descriptions of Associations:Tangential  Orientation:Partial  Thought Content:Illogical; Tangential  Hallucinations:No data recorded Ideas of Reference:Paranoia; Delusions; Percusatory  Suicidal Thoughts:No data recorded Homicidal Thoughts:No data recorded  Sensorium  Memory: Recent Poor  Judgment: Poor  Insight: Poor   Executive Functions  Concentration: Poor  Attention Span: Poor  Recall: Poor  Fund of Knowledge: Poor  Language: Poor   Psychomotor Activity  Psychomotor Activity:No data recorded  Assets  Assets: Resilience    Musculoskeletal: Strength & Muscle Tone: within normal limits Gait & Station: normal  Physical  Exam: Physical Exam Cardiovascular:     Rate and Rhythm: Normal rate.     Pulses: Normal pulses.  Musculoskeletal:     Cervical back: Normal range of motion.  Neurological:     Mental Status: She is alert.    ROS Blood pressure (!) 98/57,  pulse 73, temperature 98.1 F (36.7 C), temperature source Skin, resp. rate 16, height 5' 4 (1.626 m), weight 44.9 kg, SpO2 99%. Body mass index is 16.99 kg/m.  Principal Diagnosis: <principal problem not specified> Diagnosis:  Active Problems:   Schizophrenia [facility])   Clinical Decision Making:  Treatment Plan Summary:  Safety and Monitoring:             -- Voluntary admission to inpatient psychiatric unit for safety, stabilization and treatment             -- Daily contact with patient to assess and evaluate symptoms and progress in treatment             -- Patient's case to be discussed in multi-disciplinary team meeting             -- Observation Level: q15 minute checks             -- Vital signs:  q12 hours             -- Precautions: suicide, elopement, and assault   2. Psychiatric Diagnoses and Treatment:                   -- The risks/benefits/side-effects/alternatives to this medication were discussed in detail with the patient and time was given for questions. The patient consents to medication trial.                -- Metabolic profile and EKG monitoring obtained while on an atypical antipsychotic (BMI: Lipid Panel: HbgA1c: QTc:)              -- Encouraged patient to participate in unit milieu and in scheduled group therapies                            3. Medical Issues Being Addressed:      4. Discharge Planning:              -- Social work and case management to assist with discharge planning and identification of hospital follow-up needs prior to discharge             -- Estimated LOS: 5-7 days             -- Discharge Concerns: Need to establish a safety plan; Medication compliance and effectiveness             --  Discharge Goals: Return home with outpatient referrals follow ups  Physician Treatment Plan for Primary Diagnosis: <principal problem not specified> Long Term Goal(s): Improvement in symptoms so as ready for discharge  Short Term Goals: Ability to identify changes in lifestyle to reduce recurrence of condition will improve  Physician Treatment Plan for Secondary Diagnosis: Active Problems:   Schizophrenia (HCC)  Long Term Goal(s): Improvement in symptoms so as ready for discharge  Short Term Goals: Ability to identify changes in lifestyle to reduce recurrence of condition will improveVII. Medications (Prior to Admission)  Olanzapine  5 mg BID (nonadherent prior to admission)  VIII. Family Psychiatric and Medical History  Unknown (patient unable/unwilling to provide)  IX. Substance Use History  Denies use of alcohol, tobacco, or illicit drugs  UDS negative  X. Social History  Lives alone; poor social support  Limited engagement with outpatient care  Employment: Unemployed  Access to firearms/weapons: Unknown  Legal history: Not reported  XI. Mental Status Examination  Appearance: Thin, poorly groomed, disheveled Behavior: Irritable, guarded, intermittently agitated Speech: Loud,  pressured at times, terse responses Mood: "I'm fine. I want to leave." Affect: Constricted, irritable Thought Process: Disorganized, tangential Thought Content: Paranoid delusions--believes staff may harm her Perception: No clear auditory/visual hallucinations observed during interview Cognition: Alert, oriented to person and place; poor attention Insight: Poor Judgment: Impaired  XII. Suicide Risk Assessment  C-SSRS: Denies suicidal ideation, intent, or plan Risk factors: Chronic psychotic illness, poor adherence, social isolation, medical comorbidities Protective factors: Inpatient setting, currently medically supervised Overall Suicide Risk: Low at present  XIII.  Assessment  63 year old female with chronic paranoid schizophrenia admitted medically for dehydration and failure to thrive, presenting with paranoid delusions, irritability, and behavioral agitation. Her presentation is consistent with acute exacerbation of chronic schizophrenia, likely precipitated by nonadherence, poor nutrition, and psychosocial stressors.  XIV. Diagnoses  Primary Psychiatric Diagnosis:  Schizophrenia, paranoid type, chronic with acute exacerbation (F20.0)  Secondary Medical Diagnoses:  Dehydration  Failure to thrive  Hypoglycemia  Bradycardia  Anemia  Malnutrition  XV. Plan  Safety: Continue inpatient monitoring; patient not endorsing SI/HI but requires ongoing observation due to agitation and poor insight.  Medication Management:  Restart olanzapine  5 mg BID, monitor for efficacy and metabolic effects.  Consider gradual titration to therapeutic range as tolerated.  Evaluate need for long-acting injectable if chronic nonadherence persists.  Medical Collaboration: Continue coordination with internal medicine for hydration, nutritional optimization, and stabilization of metabolic status.  Labs: CBC, CMP, TSH, lipid panel, HbA1c, EKG--review and monitor for antipsychotic-related effects.  Psychosocial: Initiate social work consult for post-discharge planning and potential guardianship evaluation if poor self-care persists.  Education / Consent: Risks, benefits, and alternatives of medication discussed; informed consent obtained.  Communication: Case discussed with attending internist and nursing staff for coordinated plan of care.  Disposition: Continue medical stabilization; transfer to psychiatric unit when medically cleared.  Medical Necessity: Continued hospitalization warranted for stabilization of psychosis, nutrition, and safety due to inability to care for self.  I certify that inpatient services furnished can reasonably be expected to  improve the patient's condition.    Millie JONELLE Manners, MD 10/19/202512:37 PM

## 2024-05-07 NOTE — BHH Counselor (Signed)
 CSW attempted assessment with pt.  Pt declined to speak with CSW.  Pt stated You all trying to make me stay here. CSW will attempt again 05/08/24.  Rexene Mae LCSWA 05/07/24, 3:48 PM

## 2024-05-07 NOTE — Group Note (Signed)
 Date:  05/07/2024 Time:  10:33 AM  Group Topic/Focus:  Coping With Mental Health Crisis:   The purpose of this group is to help patients identify strategies for coping with mental health crisis.  Group discusses possible causes of crisis and ways to manage them effectively.    Participation Level:  Did Not Attend  Leigh VEAR Pais 05/07/2024, 10:33 AM

## 2024-05-07 NOTE — Plan of Care (Signed)
   Problem: Health Behavior/Discharge Planning: Goal: Compliance with treatment plan for underlying cause of condition will improve Outcome: Progressing   Problem: Safety: Goal: Periods of time without injury will increase Outcome: Progressing
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
## 2024-05-08 NOTE — BHH Counselor (Signed)
 CSW attempted to complete assessment with pt, pt too agitated to complete assessment at this time.   CSW will complete with legal guardian  [name] Croft, MSW, [place] 05/08/2024 4:10 PM

## 2024-05-08 NOTE — Plan of Care (Signed)
  Problem: Education: Goal: Mental status will improve Outcome: Not Progressing   Problem: Education: Goal: Emotional status will improve Outcome: Not Progressing

## 2024-05-08 NOTE — Progress Notes (Signed)
   05/07/24 2000  Psych Admission Type (Psych Patients Only)  Admission Status Involuntary  Psychosocial Assessment  Patient Complaints Agitation;Irritability  Eye Contact Avoids  Facial Expression Angry  Affect Angry;Irritable;Labile  Speech Loud  Interaction Avoidant;Isolative  Motor Activity Slow  Appearance/Hygiene In scrubs  Behavior Characteristics Agressive verbally;Unwilling to participate  Mood Labile;Suspicious;Angry  Aggressive Behavior  Targets Other (Comment) (Staff)  Type of Behavior Verbal  Effect No apparent injury  Thought Process  Coherency Disorganized;Flight of ideas;Loose associations  Content Paranoia;Delusions  Delusions Grandeur;Paranoid  Perception Derealization;Illusions  Hallucination Auditory  Judgment Poor  Confusion Moderate  Danger to Self  Current suicidal ideation? Passive  Description of Suicide Plan Did not verbalize  Self-Injurious Behavior Some self-injurious ideation observed or expressed.  No lethal plan expressed   Agreement Not to Harm Self No  Description of Agreement None  Danger to Others  Danger to Others None reported or observed   2057: Patient very irritable and uncooperative. Pt. B/P at start of shift was 77/36 per MHT with B/P monitor on left arm. I attempted to try to re-take the B/P manually put the patient yell at me and told me that she was not going to let anyone else take another B/P The patient is refusing fluids, food and medications. . I notified the two NP's on call Stoney McLauchlin and [employer]). I was instructed just to continue to keep a close watch on the patient because, if she does not allow us  to re-take her B/P we can not say that it is accurate and there is not a reason that would justify moving this patient out to a medical unit at this time. I informed the NP's that if this patient becomes irritable, aggressive and combative that I was not comfortable giving her any medications that would calm her down  with a B/P of 77/36.   0040: Patient noted to be asleep and resting quiet.  MHT rounding q 15 min. as ordered

## 2024-05-08 NOTE — BH IP Treatment Plan (Signed)
 Interdisciplinary Treatment and Diagnostic Plan Update  05/08/2024 Time of Session: 3:30 PM  [name] MRN: [id number]  Principal Diagnosis: <principal problem not specified>  Secondary Diagnoses: Active Problems:   Schizophrenia (HCC)   Current Medications:  Current Facility-Administered Medications  Medication Dose Route Frequency Provider Last Rate Last Admin   acetaminophen  (TYLENOL ) tablet 650 mg  650 mg Oral Q6H PRN Starkes-Perry, Takia S, FNP       alum & mag hydroxide-simeth (MAALOX/MYLANTA) 200-200-20 MG/5ML suspension 30 mL  30 mL Oral Q4H PRN Starkes-Perry, Takia S, FNP       feeding supplement (ENSURE PLUS HIGH PROTEIN) liquid 237 mL  237 mL Oral TID BM Starkes-Perry, Takia S, FNP       HYDROcodone-acetaminophen  (NORCO/VICODIN) 5-325 MG per tablet 1-2 tablet  1-2 tablet Oral Q4H PRN Starkes-Perry, Takia S, FNP       magnesium  hydroxide (MILK OF MAGNESIA) suspension 30 mL  30 mL Oral Daily PRN Starkes-Perry, Takia S, FNP       multivitamin with minerals tablet 1 tablet  1 tablet Oral Daily Wilkie Majel RAMAN, FNP   1 tablet at 05/08/24 0855   nicotine  (NICODERM CQ  - dosed in mg/24 hours) patch 14 mg  14 mg Transdermal Daily Jadapalle, Sree, MD       OLANZapine  (ZYPREXA ) tablet 5 mg  5 mg Oral BID Wilkie Majel RAMAN, FNP   5 mg at 05/08/24 9144   Or   OLANZapine  (ZYPREXA ) injection 5 mg  5 mg Intramuscular BID Starkes-Perry, Majel RAMAN, FNP       OLANZapine  (ZYPREXA ) injection 5 mg  5 mg Intramuscular TID PRN Starkes-Perry, Takia S, FNP       OLANZapine  zydis (ZYPREXA ) disintegrating tablet 5 mg  5 mg Oral TID PRN Starkes-Perry, Takia S, FNP       ondansetron  (ZOFRAN ) tablet 4 mg  4 mg Oral Q6H PRN Starkes-Perry, Majel RAMAN, FNP       Or   ondansetron  (ZOFRAN ) injection 4 mg  4 mg Intravenous Q6H PRN Starkes-Perry, Majel RAMAN, FNP       thiamine  (VITAMIN B1) tablet 100 mg  100 mg Oral Daily Starkes-Perry, Takia S, FNP   100 mg at 05/08/24 9144   traZODone  (DESYREL ) tablet 50  mg  50 mg Oral QHS Starkes-Perry, Takia S, FNP       PTA Medications: Medications Prior to Admission  Medication Sig Dispense Refill Last Dose/Taking   feeding supplement (ENSURE PLUS HIGH PROTEIN) LIQD Take 237 mLs by mouth 3 (three) times daily between meals.      Multiple Vitamin (MULTIVITAMIN WITH MINERALS) TABS tablet Take 1 tablet by mouth daily.      OLANZapine  (ZYPREXA ) 5 MG tablet Take 1 tablet (5 mg total) by mouth 2 (two) times daily.      OLANZapine  (ZYPREXA ) injection Inject 1 mL (5 mg total) into the muscle 2 (two) times daily.      thiamine  (VITAMIN B-1) 100 MG tablet Take 1 tablet (100 mg total) by mouth daily.      traZODone  (DESYREL ) 50 MG tablet Take 1 tablet (50 mg total) by mouth at bedtime.       Patient Stressors:    Patient Strengths:    Treatment Modalities: Medication Management, Group therapy, Case management,  1 to 1 session with clinician, Psychoeducation, Recreational therapy.   Physician Treatment Plan for Primary Diagnosis: <principal problem not specified> Long Term Goal(s): Improvement in symptoms so as ready for discharge   Short Term Goals: Ability  to identify changes in lifestyle to reduce recurrence of condition will improve  Medication Management: Evaluate patient's response, side effects, and tolerance of medication regimen.  Therapeutic Interventions: 1 to 1 sessions, Unit Group sessions and Medication administration.  Evaluation of Outcomes: Not Progressing  Physician Treatment Plan for Secondary Diagnosis: Active Problems:   Schizophrenia (HCC)  Long Term Goal(s): Improvement in symptoms so as ready for discharge   Short Term Goals: Ability to identify changes in lifestyle to reduce recurrence of condition will improve     Medication Management: Evaluate patient's response, side effects, and tolerance of medication regimen.  Therapeutic Interventions: 1 to 1 sessions, Unit Group sessions and Medication administration.  Evaluation  of Outcomes: Not Progressing   RN Treatment Plan for Primary Diagnosis: <principal problem not specified> Long Term Goal(s): Knowledge of disease and therapeutic regimen to maintain health will improve  Short Term Goals: Ability to remain free from injury will improve, Ability to verbalize frustration and anger appropriately will improve, Ability to demonstrate self-control, Ability to participate in decision making will improve, Ability to verbalize feelings will improve, Ability to disclose and discuss suicidal ideas, Ability to identify and develop effective coping behaviors will improve, and Compliance with prescribed medications will improve  Medication Management: RN will administer medications as ordered by provider, will assess and evaluate patient's response and provide education to patient for prescribed medication. RN will report any adverse and/or side effects to prescribing provider.  Therapeutic Interventions: 1 on 1 counseling sessions, Psychoeducation, Medication administration, Evaluate responses to treatment, Monitor vital signs and CBGs as ordered, Perform/monitor CIWA, COWS, AIMS and Fall Risk screenings as ordered, Perform wound care treatments as ordered.  Evaluation of Outcomes: Not Progressing   LCSW Treatment Plan for Primary Diagnosis: <principal problem not specified> Long Term Goal(s): Safe transition to appropriate next level of care at discharge, Engage patient in therapeutic group addressing interpersonal concerns.  Short Term Goals: Engage patient in aftercare planning with referrals and resources, Increase social support, Increase ability to appropriately verbalize feelings, Increase emotional regulation, Facilitate acceptance of mental health diagnosis and concerns, Facilitate patient progression through stages of change regarding substance use diagnoses and concerns, Identify triggers associated with mental health/substance abuse issues, and Increase skills for  wellness and recovery  Therapeutic Interventions: Assess for all discharge needs, 1 to 1 time with Social worker, Explore available resources and support systems, Assess for adequacy in community support network, Educate family and significant other(s) on suicide prevention, Complete Psychosocial Assessment, Interpersonal group therapy.  Evaluation of Outcomes: Not Progressing   Progress in Treatment: Attending groups: No. Participating in groups: No. Taking medication as prescribed: No. Toleration medication: Yes. Family/Significant other contact made: No, will contact:  CSW will contact legal guardian  Patient understands diagnosis: No. Discussing patient identified problems/goals with staff: Yes. and No. Medical problems stabilized or resolved: Yes. Denies suicidal/homicidal ideation: Yes. Issues/concerns per patient self-inventory: No. Other: None   New problem(s) identified: No, Describe:  None identified   New Short Term/Long Term Goal(s): elimination of symptoms of psychosis, medication management for mood stabilization; elimination of SI thoughts; development of comprehensive mental wellness plan.   Patient Goals:  I just need to leave, I don't need it here  Discharge Plan or Barriers: CSW will assist with appropriate discharge planning   Reason for Continuation of Hospitalization: Aggression Hallucinations Medication stabilization  Estimated Length of Stay: 1 to 7 days  Last 3 [place] Suicide Severity Risk Score: Flowsheet Row Admission (Current) from 05/06/2024 in [facility]  BEHAVIORAL MEDICINE ED to Hosp-Admission (Discharged) from 05/02/2024 in [place] [facility] ED from 04/30/2024 in [facility]  C-SSRS RISK CATEGORY No Risk No Risk No Risk    Last [name] 2/9 Scores:     No data to display          Scribe for Treatment Team: [name] JONETTA Raynaldo ISRAEL 05/08/2024 3:53 PM

## 2024-05-08 NOTE — Progress Notes (Signed)
 [facility] MD Progress Note  05/08/2024 11:42 PM [name]  MRN:  [id number]  [name] is a 63 y.o. female admitted: Medicallyfor 05/02/2024 10:56 AM for dehydration, failure to thrive and hypoglycemia. She carries the psychiatric diagnoses of Schizophrenia and has medical hx of hypoglycemia and failure to thrive and has a past medical history of sinus bradycardia, chronic anemia, severe malnutrition and recurrent hypoglycemia.. Her current presentation of verbal aggression, agitation and refusal to eat with severe paranoia is most consistent with paranoid schizophrenia. [name]  She was at [facility] where she was admitted on 04/30/2024.  She was transferred to the ED because of failure to thrive and was admitted to medical floor.Patient is on IVC and patient has been adamantly refusing all oral medications and refusing to eat.  Patient is admitted to [facility] unit with Q15 min safety monitoring. Multidisciplinary team approach is offered. Medication management; group/milieu therapy is offered.    Subjective:  Chart reviewed, case discussed in multidisciplinary meeting, patient seen during rounds.  Patient is noted to be very irritable resting in her room.  Patient met with the treatment team but refused to engage in any meaningful conversations.  She refused all her assessment and refused medication per nursing report.  Patient was not willing to answer any questions related to mental health.  She kept repeating that she is not crazy and needs to go home.  When asked about hallucinations she got upset and said she has not been answered those questions.  Later in the day she is noted to be pacing in the room and talking to herself.  No collateral information is available at this point.  Will continue to reach out to any family members or next of kin.  Will initiate nonemergent forced medication consult.  Sleep: Fair  Appetite:  Poor  Past Psychiatric History: see h&P Family History: History reviewed. No  pertinent family history. Social History:  Social History   Substance and Sexual Activity  Alcohol Use Not Currently   Comment: Refuses to disclose how much     Social History   Substance and Sexual Activity  Drug Use No   Comment: Refuses to answer    Social History   Socioeconomic History   Marital status: Single    Spouse name: Not on file   Number of children: Not on file   Years of education: Not on file   Highest education level: Not on file  Occupational History   Not on file  Tobacco Use   Smoking status: Every Day    Current packs/day: 2.00    Types: Cigarettes    Passive exposure: Current   Smokeless tobacco: Never  Vaping Use   Vaping status: Never Used  Substance and Sexual Activity   Alcohol use: Not Currently    Comment: Refuses to disclose how much   Drug use: No    Comment: Refuses to answer   Sexual activity: Not Currently    Comment: refused to answer  Other Topics Concern   Not on file  Social History Narrative   Not on file   Social Drivers of Health   Financial Resource Strain: Not on file  Food Insecurity: Patient Declined (05/06/2024)   Hunger Vital Sign    Worried About Running Out of Food in the Last Year: Patient declined    Ran Out of Food in the Last Year: Patient declined  Transportation Needs: Patient Declined (05/06/2024)   PRAPARE - Administrator, Civil Service (Medical):  Patient declined    Lack of Transportation (Non-Medical): Patient declined  Physical Activity: Not on file  Stress: Not on file  Social Connections: Not on file   Past Medical History:  Past Medical History:  Diagnosis Date   Anemia 10/02/2021   Non compliance w medication regimen    Schizophrenia [facility])     Past Surgical History:  Procedure Laterality Date   BIOPSY  09/25/2021   Procedure: BIOPSY;  Surgeon: Teressa Toribio SQUIBB, MD;  Location: [facility];  Service: Gastroenterology;;   ESOPHAGOGASTRODUODENOSCOPY (EGD) WITH PROPOFOL  [name]  09/25/2021   Procedure: ESOPHAGOGASTRODUODENOSCOPY (EGD) WITH PROPOFOL ;  Surgeon: Teressa Toribio SQUIBB, MD;  Location: [facility];  Service: Gastroenterology;  Laterality: [name];  With NGT placement    Current Medications: Current Facility-Administered Medications  Medication Dose Route Frequency Provider Last Rate Last Admin   acetaminophen  (TYLENOL ) tablet 650 mg  650 mg Oral Q6H PRN Starkes-Perry, Takia S, FNP       alum & mag hydroxide-simeth (MAALOX/MYLANTA) 200-200-20 MG/5ML suspension 30 mL  30 mL Oral Q4H PRN Starkes-Perry, Takia S, FNP       feeding supplement (ENSURE PLUS HIGH PROTEIN) liquid 237 mL  237 mL Oral TID BM Starkes-Perry, Takia S, FNP       HYDROcodone-acetaminophen  (NORCO/VICODIN) 5-325 MG per tablet 1-2 tablet  1-2 tablet Oral Q4H PRN Starkes-Perry, Takia S, FNP       magnesium  hydroxide (MILK OF MAGNESIA) suspension 30 mL  30 mL Oral Daily PRN Starkes-Perry, Takia S, FNP       multivitamin with minerals tablet 1 tablet  1 tablet Oral Daily Starkes-Perry, Takia S, FNP   1 tablet at 05/08/24 0855   nicotine  (NICODERM CQ  - dosed in mg/24 hours) patch 14 mg  14 mg Transdermal Daily Ludmila Ebarb, MD       OLANZapine  (ZYPREXA ) tablet 5 mg  5 mg Oral BID Wilkie Majel RAMAN, FNP   5 mg at 05/08/24 9144   Or   OLANZapine  (ZYPREXA ) injection 5 mg  5 mg Intramuscular BID Wilkie Majel RAMAN, FNP       OLANZapine  (ZYPREXA ) injection 5 mg  5 mg Intramuscular TID PRN Starkes-Perry, Takia S, FNP       OLANZapine  zydis (ZYPREXA ) disintegrating tablet 5 mg  5 mg Oral TID PRN Starkes-Perry, Takia S, FNP       ondansetron  (ZOFRAN ) tablet 4 mg  4 mg Oral Q6H PRN Starkes-Perry, Majel RAMAN, FNP       Or   ondansetron  (ZOFRAN ) injection 4 mg  4 mg Intravenous Q6H PRN Starkes-Perry, Majel RAMAN, FNP       thiamine  (VITAMIN B1) tablet 100 mg  100 mg Oral Daily Starkes-Perry, Takia S, FNP   100 mg at 05/08/24 9144   traZODone  (DESYREL ) tablet 50 mg  50 mg Oral QHS Wilkie Majel RAMAN, FNP         Lab Results: No results found for this or any previous visit (from the past 48 hours).  Blood Alcohol level:  Lab Results  Component Value Date   [facility] <15 05/02/2024   ETH <15 05/01/2024    Metabolic Disorder Labs: Lab Results  Component Value Date   HGBA1C 5.7 (H) 05/02/2024   MPG 116.89 05/02/2024   MPG 114 10/21/2021   Lab Results  Component Value Date   PROLACTIN 13.6 04/16/2015   Lab Results  Component Value Date   CHOL 222 (H) 05/01/2024   TRIG 63 05/01/2024   HDL 79 05/01/2024   CHOLHDL  2.8 05/01/2024   VLDL 13 05/01/2024   LDLCALC 130 (H) 05/01/2024   LDLCALC 129 (H) 10/21/2021    Physical Findings: AIMS:  , ,  ,  ,    CIWA:    COWS:      Psychiatric Specialty Exam:  Presentation  General Appearance:  Casual  Eye Contact: Fair  Speech: Pressured  Speech Volume: Increased    Mood and Affect  Mood: Angry  Affect: Congruent   Thought Process  Thought Processes: Disorganized  Descriptions of Associations:Tangential  Orientation:Partial  Thought Content:Illogical; Tangential  Hallucinations:RESPONDING TO INTERNAL STIMULI Ideas of Reference:Paranoia; Delusions; Percusatory  Suicidal Thoughts:not endorsing Homicidal Thoughts:nor endorsing  Sensorium  Memory: Recent Poor  Judgment: Poor  Insight: Poor   Executive Functions  Concentration: Poor  Attention Span: Poor  Recall: Poor  Fund of Knowledge: Poor  Language: Poor   Psychomotor Activity  Psychomotor Activity:No data recorded Musculoskeletal: Strength & Muscle Tone: within normal limits Gait & Station: normal Assets  Assets: Resilience    Physical Exam: Physical Exam ROS Blood pressure 97/62, pulse 62, temperature 98.2 F (36.8 C), resp. rate 14, height 5' 4 (1.626 m), weight 44.9 kg, SpO2 100%. Body mass index is 16.99 kg/m.  Diagnosis: Active Problems:   Schizophrenia (HCC) Paranoid  PLAN: Safety and Monitoring:  --  Involuntary admission to inpatient psychiatric unit for safety, stabilization and treatment  -- Daily contact with patient to assess and evaluate symptoms and progress in treatment  -- Patient's case to be discussed in multi-disciplinary team meeting  -- Observation Level : q15 minute checks  -- Vital signs:  q12 hours  -- Precautions: suicide, elopement, and assault -- Encouraged patient to participate in unit milieu and in scheduled group therapies  2. Psychiatric Diagnoses and Treatment:    Zyprexa  5 mg bID oral or Im -non emergent forced medication  . Discharge Planning:   -- Social work and case management to assist with discharge planning and identification of hospital follow-up needs prior to discharge  -- Estimated LOS: 3-4 days  Allyn Foil, MD 05/08/2024, 11:42 PM

## 2024-05-08 NOTE — Group Note (Signed)
 Date:  05/08/2024 Time:  11:24 AM  Group Topic/Focus:  Managing Feelings:   The focus of this group is to identify what feelings patients have difficulty handling and develop a plan to handle them in a healthier way upon discharge.    Participation Level:  Did Not Attend   [name] 05/08/2024, 11:24 AM

## 2024-05-08 NOTE — Progress Notes (Signed)
 Pt refused night medications Zyprexa  and Trazodone . Pt states she don't need that medicine and they already gave me some earlier. Pt will continue to be monitored every 15 minutes for safety.

## 2024-05-08 NOTE — Plan of Care (Signed)
  Problem: Coping: Goal: Ability to demonstrate self-control will improve Outcome: Progressing   Problem: Education: Goal: Mental status will improve Outcome: Not Progressing   Problem: Activity: Goal: Interest or engagement in activities will improve Outcome: Not Progressing

## 2024-05-08 NOTE — Progress Notes (Signed)
 Behavior:  Irritable. Hostile.    Psych assessment:  Refuses to answer assessment questions.  Interaction / Group attendance:  Isolates to room with the exception of meals.   Medication/ PRNs:  Compliant.  Pain:  None reported  15 min checks in place for safety.

## 2024-05-08 NOTE — Plan of Care (Signed)
  Problem: Activity: Goal: Interest or engagement in activities will improve Outcome: Not Progressing   Problem: Physical Regulation: Goal: Ability to maintain clinical measurements within normal limits will improve Outcome: Progressing   Problem: Safety: Goal: Periods of time without injury will increase Outcome: Progressing

## 2024-05-08 NOTE — Group Note (Unsigned)
 Date:  05/08/2024 Time:  5:35 PM  Group Topic/Focus:  Self Care:   The focus of this group is to help patients understand the importance of self-care in order to improve or restore emotional, physical, spiritual, interpersonal, and financial health.     Participation Level:  {BHH PARTICIPATION OZCZO:77735}  Participation Quality:  {BHH PARTICIPATION QUALITY:22265}  Affect:  {BHH AFFECT:22266}  Cognitive:  {BHH COGNITIVE:22267}  Insight: {BHH Insight2:20797}  Engagement in Group:  {BHH ENGAGEMENT IN HMNLE:77731}  Modes of Intervention:  {BHH MODES OF INTERVENTION:22269}  Additional Comments:  ***  [name] 05/08/2024, 5:35 PM

## 2024-05-08 NOTE — Group Note (Signed)
 Date:  05/08/2024 Time:  5:37 PM  Group Topic/Focus:  Self Care:   The focus of this group is to help patients understand the importance of self-care in order to improve or restore emotional, physical, spiritual, interpersonal, and financial health.    Participation Level:  None  Participation Quality:  Resistant  Affect:  Blunted  Cognitive:  Delusional  Insight: None  Engagement in Group:  Resistant  Modes of Intervention:  Discussion  Additional Comments:    [name] 05/08/2024, 5:37 PM

## 2024-05-08 NOTE — Progress Notes (Signed)
   05/08/24 1942  Psych Admission Type (Psych Patients Only)  Admission Status Involuntary  Psychosocial Assessment  Patient Complaints None  Eye Contact Brief  Facial Expression Sad  Affect Sad  Speech Soft  Interaction Minimal  Motor Activity Slow  Appearance/Hygiene In scrubs  Behavior Characteristics Cooperative;Calm  Mood Sad  Aggressive Behavior  Effect No apparent injury  Thought Process  Coherency Unable to assess  Content UTA  Delusions UTA  Perception UTA  Hallucination UTA  Judgment Poor  Confusion Mild  Danger to Self  Current suicidal ideation? Denies (declines to answer)  Danger to Others  Danger to Others None reported or observed

## 2024-05-08 NOTE — Group Note (Signed)
 Recreation Therapy Group Note   Group Topic:Other  Group Date: 05/08/2024 Start Time: 1400 End Time: 1440 Facilitators: Celestia Jeoffrey BRAVO, LRT, CTRS Location: Dayroom  Activity Description/Intervention: Therapeutic Drumming. Patients with peers and staff were given the opportunity to engage in a leader facilitated [facility] Group Empowerment Drumming Circle with staff from the [employer], in partnership with [employer]. Teaching laboratory technician and trained [employer], Norleen Mon leading with LRT observing and documenting intervention and pt response. This evidenced-based practice targets 7 areas of health and wellbeing in the human experience including: stress-reduction, exercise, self-expression, camaraderie/support, nurturing, spirituality, and music-making (leisure).    Goal Area(s) Addresses:  Patient will engage in pro-social way in music group.  Patient will follow directions of drum leader on the first prompt. Patient will demonstrate no behavioral issues during group.  Patient will identify if a reduction in stress level occurs as a result of participation in therapeutic drum circle.      Affect/Mood: [name]   Participation Level: Did not attend    Clinical Observations/Individualized Feedback: Patient did not attend group.   Plan: Continue to engage patient in RT group sessions 2-3x/week.   [address] LRT, CTRS 05/08/2024 4:25 PM

## 2024-05-09 NOTE — Group Note (Signed)
 Date:  05/09/2024 Time:  3:46 PM  Group Topic/Focus:  Pet Therapy:  Rollo the Pet Therapist arrived on our unit and decided to visit our patients today!  Rollo benefits all the patients with stress reduction and overall better mental health status.    Participation Level:  Did Not Attend   [name] HERO Lovel Suazo 05/09/2024, 3:46 PM

## 2024-05-09 NOTE — Group Note (Signed)
 Date:  05/09/2024 Time:  10:37 AM  Group Topic/Focus:  Early Warning Signs:   The focus of this group is to help patients identify signs or symptoms they exhibit before slipping into an unhealthy state or crisis.    Participation Level:  Did Not Attend   [name] [name] 05/09/2024, 10:37 AM

## 2024-05-09 NOTE — Group Note (Signed)
 Recreation Therapy Group Note   Group Topic:Animal Assisted Therapy   Group Date: 05/09/2024 Start Time: 1400 End Time: 1450 Facilitators: Celestia Jeoffrey BRAVO, LRT, CTRS Location: Courtyard  Group Description: AAA. Animal-Assisted Activity provides opportunities for motivational, educational, therapeutic and/or recreational benefits to enhance quality of life. Selinda and Rollo visited the unit to interact with patients.   Goal Areas Addressed:  Reduced anxiety and stress Improved mood Increased social interaction Enhanced communication skills Reduced loneliness and isolation Improved emotional regulation  Affect/Mood: [name]   Participation Level: Did not attend    Clinical Observations/Individualized Feedback: Patient did not attend group.   Plan: Continue to engage patient in RT group sessions 2-3x/week.   Jeoffrey BRAVO Celestia, LRT, CTRS 05/09/2024 4:06 PM

## 2024-05-09 NOTE — Group Note (Signed)
 Date:  05/09/2024 Time:  11:05 PM  Group Topic/Focus:  Wrap-Up Group:   The focus of this group is to help patients review their daily goal of treatment and discuss progress on daily workbooks.    Participation Level:  Did Not Attend  Participation Quality:     Affect:     Cognitive:     Insight: None  Engagement in Group:  None  Modes of Intervention:     Additional Comments:    [name] 05/09/2024, 11:05 PM

## 2024-05-09 NOTE — Progress Notes (Signed)
 Behavior:  Cooperative with medications.     Psych assessment:  Denies SI/HI.  UTA any further. I'm not here for all that mess.   Interaction / Group attendance:  Only present in milieu for meals.  No interaction with peers.  Minimal interaction with staff.  No initiation.  Medication/ PRNs: Compliant.  Pain:  None reported  15 min checks in place for safety.

## 2024-05-09 NOTE — Group Note (Signed)
 Date:  05/09/2024 Time:  11:22 PM  Group Topic/Focus:  Wrap-Up Group:   The focus of this group is to help patients review their daily goal of treatment and discuss progress on daily workbooks.    Participation Level:  Did Not Attend  Participation Quality:     Affect:     Cognitive:     Insight: None  Engagement in Group:  None  Modes of Intervention:     Additional Comments:    [name] 05/09/2024, 11:22 PM

## 2024-05-09 NOTE — Plan of Care (Signed)
  Problem: Education: Goal: Mental status will improve Outcome: Not Progressing Goal: Verbalization of understanding the information provided will improve Outcome: Not Progressing   Problem: Activity: Goal: Interest or engagement in activities will improve Outcome: Not Progressing

## 2024-05-09 NOTE — [facility] Counselor (Signed)
 CSW attempted to contact pt's legal guardian, as listed in pt's chart, [name] [phone]) , to complete assessment.   Operator reports the number dialed is not in service.   [name] Croft, MSW, [place] 05/09/2024 10:30 AM

## 2024-05-09 NOTE — Group Note (Signed)
 [facility] LCSW Group Therapy Note   Group Date: 05/09/2024 Start Time: 1315 End Time: 1400   Type of Therapy/Topic:  Group Therapy:  Emotion Regulation  Participation Level:  Did Not Attend   Mood:  Description of Group:    The purpose of this group is to assist patients in learning to regulate negative emotions and experience positive emotions. Patients will be guided to discuss ways in which they have been vulnerable to their negative emotions. These vulnerabilities will be juxtaposed with experiences of positive emotions or situations, and patients challenged to use positive emotions to combat negative ones. Special emphasis will be placed on coping with negative emotions in conflict situations, and patients will process healthy conflict resolution skills.  Therapeutic Goals: Patient will identify two positive emotions or experiences to reflect on in order to balance out negative emotions:  Patient will label two or more emotions that they find the most difficult to experience:  Patient will be able to demonstrate positive conflict resolution skills through discussion or role plays:   Summary of Patient Progress:   X    Therapeutic Modalities:   Cognitive Behavioral Therapy Feelings Identification Dialectical Behavioral Therapy   [name] [name], LCSWA

## 2024-05-09 NOTE — Progress Notes (Signed)
 [facility] Second Physician Opinion Progress Note for Medication Administration to Non-consenting Patients (For Involuntarily Committed Patients)  Patient: [name] Date of Birth: [id number] MRN: [id number]  Reason for the Medication: The patient, without the benefit of the specific treatment measure, is incapable of participating in any available treatment plan that will give the patient a realistic opportunity of improving the patient's condition.  Consideration of Side Effects: Consideration of the side effects related to the medication plan has been given.  Rationale for Medication Administration: psychotic and unable to make rationale clinical decisions    Paula JONELLE Manners, MD 05/09/24  3:28 PM   This documentation is good for (7) seven days from the date of the MD signature. New documentation must be completed every seven (7) days with detailed justification in the medical record if the patient requires continued non-emergent administration of psychotropic medications.

## 2024-05-09 NOTE — [facility] Counselor (Signed)
 As CSW was unable to reach pt's legal guardian, CSW contacted [facility] Court to pt's legal guardian.   [facility] Court reports that pt's legal guardian is Customer service manager and has been since 2008.   CSW asked if they had phone number for [name], they report the file has not been uploaded and therefore they do not have updated contact information.   CSW will inform care team.   [name] Croft, MSW, [facility] 05/09/2024 10:38 AM

## 2024-05-09 NOTE — Progress Notes (Signed)
 [facility] MD Progress Note  05/09/2024 8:33 PM Melodi Happel  MRN:  [id number]  Andilyn Bettcher is a 63 y.o. female admitted: [facility]for 05/02/2024 10:56 AM for dehydration, failure to thrive and hypoglycemia. She carries the psychiatric diagnoses of Schizophrenia and has medical hx of hypoglycemia and failure to thrive and has a past medical history of sinus bradycardia, chronic anemia, severe malnutrition and recurrent hypoglycemia.. Her current presentation of verbal aggression, agitation and refusal to eat with severe paranoia is most consistent with paranoid schizophrenia. [name]  She was at [facility] where she was admitted on 04/30/2024.  She was transferred to the ED because of failure to thrive and was admitted to medical floor.Patient is on IVC and patient has been adamantly refusing all oral medications and refusing to eat.  Patient is admitted to [facility] unit with Q15 min safety monitoring. Multidisciplinary team approach is offered. Medication management; group/milieu therapy is offered.    Subjective:  Chart reviewed, case discussed in multidisciplinary meeting, patient seen during rounds.  Patient is noted to be pacing in her room.  She remains hostile and refusing to participate in any assessments.  She offers no complaints.  She is noted to be mumbling to herself responding to stimuli.  When asked about hallucinations she gets upset and states that she does not belong here and she needs to go home.  When provider asked about her whereabouts and the family support she states that is her personal information and the team does not need to know.  Per nursing patient remains partially compliant with her medications.  Sleep: Fair  Appetite:  Poor  Past Psychiatric History: see h&P Family History: History reviewed. No pertinent family history. Social History:  Social History   Substance and Sexual Activity  Alcohol Use Not Currently   Comment: Refuses to disclose how much     Social History    Substance and Sexual Activity  Drug Use No   Comment: Refuses to answer    Social History   Socioeconomic History   Marital status: Single    Spouse name: Not on file   Number of children: Not on file   Years of education: Not on file   Highest education level: Not on file  Occupational History   Not on file  Tobacco Use   Smoking status: Every Day    Current packs/day: 2.00    Types: Cigarettes    Passive exposure: Current   Smokeless tobacco: Never  Vaping Use   Vaping status: Never Used  Substance and Sexual Activity   Alcohol use: Not Currently    Comment: Refuses to disclose how much   Drug use: No    Comment: Refuses to answer   Sexual activity: Not Currently    Comment: refused to answer  Other Topics Concern   Not on file  Social History Narrative   Not on file   Social Drivers of Health   Financial Resource Strain: Not on file  Food Insecurity: Patient Declined (05/06/2024)   Hunger Vital Sign    Worried About Running Out of Food in the Last Year: Patient declined    Ran Out of Food in the Last Year: Patient declined  Transportation Needs: Patient Declined (05/06/2024)   PRAPARE - Administrator, Civil Service (Medical): Patient declined    Lack of Transportation (Non-Medical): Patient declined  Physical Activity: Not on file  Stress: Not on file  Social Connections: Not on file   Past Medical History:  Past  Medical History:  Diagnosis Date   Anemia 10/02/2021   Non compliance w medication regimen    Schizophrenia [facility])     Past Surgical History:  Procedure Laterality Date   BIOPSY  09/25/2021   Procedure: BIOPSY;  Surgeon: Teressa Toribio SQUIBB, MD;  Location: [facility];  Service: Gastroenterology;;   ESOPHAGOGASTRODUODENOSCOPY (EGD) WITH PROPOFOL  [name] 09/25/2021   Procedure: ESOPHAGOGASTRODUODENOSCOPY (EGD) WITH PROPOFOL ;  Surgeon: Teressa Toribio SQUIBB, MD;  Location: [facility];  Service: Gastroenterology;  Laterality: [name];  With NGT  placement    Current Medications: Current Facility-Administered Medications  Medication Dose Route Frequency Provider Last Rate Last Admin   acetaminophen  (TYLENOL ) tablet 650 mg  650 mg Oral Q6H PRN Starkes-Perry, Takia S, FNP       alum & mag hydroxide-simeth (MAALOX/MYLANTA) 200-200-20 MG/5ML suspension 30 mL  30 mL Oral Q4H PRN Starkes-Perry, Takia S, FNP       feeding supplement (ENSURE PLUS HIGH PROTEIN) liquid 237 mL  237 mL Oral TID BM Starkes-Perry, Takia S, FNP       HYDROcodone-acetaminophen  (NORCO/VICODIN) 5-325 MG per tablet 1-2 tablet  1-2 tablet Oral Q4H PRN Starkes-Perry, Takia S, FNP       magnesium  hydroxide (MILK OF MAGNESIA) suspension 30 mL  30 mL Oral Daily PRN Starkes-Perry, Takia S, FNP       multivitamin with minerals tablet 1 tablet  1 tablet Oral Daily Starkes-Perry, Takia S, FNP   1 tablet at 05/09/24 9097   nicotine  (NICODERM CQ  - dosed in mg/24 hours) patch 14 mg  14 mg Transdermal Daily Cal Gindlesperger, MD       OLANZapine  (ZYPREXA ) tablet 5 mg  5 mg Oral BID Wilkie Majel RAMAN, FNP   5 mg at 05/09/24 9097   Or   OLANZapine  (ZYPREXA ) injection 5 mg  5 mg Intramuscular BID Starkes-Perry, Takia S, FNP       OLANZapine  (ZYPREXA ) injection 5 mg  5 mg Intramuscular TID PRN Starkes-Perry, Takia S, FNP       OLANZapine  zydis (ZYPREXA ) disintegrating tablet 5 mg  5 mg Oral TID PRN Starkes-Perry, Majel RAMAN, FNP       ondansetron  (ZOFRAN ) tablet 4 mg  4 mg Oral Q6H PRN Starkes-Perry, Majel RAMAN, FNP       Or   ondansetron  (ZOFRAN ) injection 4 mg  4 mg Intravenous Q6H PRN Starkes-Perry, Majel RAMAN, FNP       thiamine  (VITAMIN B1) tablet 100 mg  100 mg Oral Daily Wilkie Majel RAMAN, FNP   100 mg at 05/09/24 9097   traZODone  (DESYREL ) tablet 50 mg  50 mg Oral QHS Wilkie Majel RAMAN, FNP        Lab Results: No results found for this or any previous visit (from the past 48 hours).  Blood Alcohol level:  Lab Results  Component Value Date   [facility] <15 05/02/2024   ETH  <15 05/01/2024    Metabolic Disorder Labs: Lab Results  Component Value Date   HGBA1C 5.7 (H) 05/02/2024   MPG 116.89 05/02/2024   MPG 114 10/21/2021   Lab Results  Component Value Date   PROLACTIN 13.6 04/16/2015   Lab Results  Component Value Date   CHOL 222 (H) 05/01/2024   TRIG 63 05/01/2024   HDL 79 05/01/2024   CHOLHDL 2.8 05/01/2024   VLDL 13 05/01/2024   LDLCALC 130 (H) 05/01/2024   LDLCALC 129 (H) 10/21/2021    Physical Findings: AIMS:  , ,  ,  ,  CIWA:    COWS:      Psychiatric Specialty Exam:  Presentation  General Appearance:  Casual  Eye Contact: Fair  Speech: Pressured  Speech Volume: Increased    Mood and Affect  Mood: Angry  Affect: Congruent   Thought Process  Thought Processes: Disorganized  Descriptions of Associations:Tangential  Orientation:Partial  Thought Content:Illogical; Tangential  Hallucinations:RESPONDING TO INTERNAL STIMULI Ideas of Reference:Paranoia; Delusions; Percusatory  Suicidal Thoughts:not endorsing Homicidal Thoughts:nor endorsing  Sensorium  Memory: Recent Poor  Judgment: Poor  Insight: Poor   Executive Functions  Concentration: Poor  Attention Span: Poor  Recall: Poor  Fund of Knowledge: Poor  Language: Poor   Psychomotor Activity  Psychomotor Activity:No data recorded Musculoskeletal: Strength & Muscle Tone: within normal limits Gait & Station: normal Assets  Assets: Resilience    Physical Exam: Physical Exam Vitals and nursing note reviewed.    ROS Blood pressure (!) 111/54, pulse 60, temperature 98.1 F (36.7 C), resp. rate 14, height 5' 4 (1.626 m), weight 44.9 kg, SpO2 97%. Body mass index is 16.99 kg/m.  Diagnosis: Active Problems:   Schizophrenia, paranoid (HCC) Paranoid  PLAN: Safety and Monitoring:  -- Involuntary admission to inpatient psychiatric unit for safety, stabilization and treatment  -- Daily contact with patient to assess and  evaluate symptoms and progress in treatment  -- Patient's case to be discussed in multi-disciplinary team meeting  -- Observation Level : q15 minute checks  -- Vital signs:  q12 hours  -- Precautions: suicide, elopement, and assault -- Encouraged patient to participate in unit milieu and in scheduled group therapies  2. Psychiatric Diagnoses and Treatment:  2 physicians approved non emergent forced medications-Dr.Madaram and Dr.Jafari Mckillop  Zyprexa  5 mg bID oral or Im -non emergent forced medication  . Discharge Planning:   -- Social work and case management to assist with discharge planning and identification of hospital follow-up needs prior to discharge  -- Estimated LOS: 3-4 days  Allyn Foil, MD 05/09/2024, 8:33 PM

## 2024-05-10 NOTE — Plan of Care (Signed)
   Problem: Education: Goal: Knowledge of [facility] Education information/materials will improve Outcome: Progressing Goal: Emotional status will improve Outcome: Progressing Goal: Mental status will improve Outcome: Progressing Goal: Verbalization of understanding the information provided will improve Outcome: Progressing

## 2024-05-10 NOTE — Group Note (Signed)
 Date:  05/10/2024 Time:  8:35 PM  Group Topic/Focus:  Goals Group:   The focus of this group is to help patients establish daily goals to achieve during treatment and discuss how the patient can incorporate goal setting into their daily lives to aide in recovery.     Participation Level:  Did Not Attend  Participation Quality:    Affect:    Cognitive:     Insight: None  Engagement in Group:  None  Modes of Intervention:    Additional Comments:    Sherrilyn JAYSON Redman 05/10/2024, 8:35 PM

## 2024-05-10 NOTE — [facility] Counselor (Signed)
 Adult Comprehensive Assessment  Patient ID: [name], female   DOB: 1961-01-04, 63 y.o.   MRN: [id number]  Information Source: Information source: Patient  Current Stressors:  Patient states their primary concerns and needs for treatment are:: UTA Patient states their goals for this hospitilization and ongoing recovery are:: Counsellor / Learning stressors: UTA Employment / Job issues: UTA Family Relationships: Oncologist / Lack of resources (include bankruptcy): UTA Housing / Lack of housing: UTA Physical health (include injuries & life threatening diseases): UTA Social relationships: UTA Substance abuse: UTA Bereavement / Loss: UTA  Living/Environment/Situation:  Living Arrangements:  (UTA) Living conditions (as described by patient or guardian): UTA Who else lives in the home?: UTA How long has patient lived in current situation?: UTA What is atmosphere in current home:  (UTA)  Family History:  Marital status:  (UTA) Are you sexually active?:  (UTA) What is your sexual orientation?: UTA Has your sexual activity been affected by drugs, alcohol, medication, or emotional stress?: UTA Does patient have children?:  (UTA)  Childhood History:  By whom was/is the patient raised?:  (UTA) Additional childhood history information: UTA Description of patient's relationship with caregiver when they were a child: UTA Patient's description of current relationship with people who raised him/her: UTA How were you disciplined when you got in trouble as a child/adolescent?: UTA Does patient have siblings?:  (UTA) Did patient suffer any verbal/emotional/physical/sexual abuse as a child?:  (UTA) Did patient suffer from severe childhood neglect?:  (UTA) Has patient ever been sexually abused/assaulted/raped as an adolescent or adult?:  (UTA) Was the patient ever a victim of a crime or a disaster?:  (UTA) Witnessed domestic violence?:  (UTA) Has patient been affected by domestic  violence as an adult?:  Industrial/product designer)  Education:  Highest grade of school patient has completed: UTA Currently a Consulting civil engineer?:  (UTA) Learning disability?:  (UTA)  Employment/Work Situation:   Employment Situation:  Industrial/product designer) Why is Patient on Disability: UTA How Long has Patient Been on Disability: UTA Patient's Job has Been Impacted by Current Illness:  (UTA) What is the Longest Time Patient has Held a Job?: UTA Where was the Patient Employed at that Time?: UTA Has Patient ever Been in the [employer]?:  (UTA)  Financial Resources:   Financial resources:  Industrial/product designer) Does patient have a Lawyer or guardian?:  (UTA)  Alcohol/Substance Abuse:   What has been your use of drugs/alcohol within the last 12 months?: UTA If attempted suicide, did drugs/alcohol play a role in this?:  (UTA) Alcohol/Substance Abuse Treatment Hx:  (UTA) If yes, describe treatment: UTA Has alcohol/substance abuse ever caused legal problems?:  (UTA)  Social Support System:   Patient's Community Support System:  (UTA) Describe Community Support System: UTA Type of faith/religion: UTA How does patient's faith help to cope with current illness?: UTA  Leisure/Recreation:   Do You Have Hobbies?:  (UTA)  Strengths/Needs:   What is the patient's perception of their strengths?: UTA Patient states they can use these personal strengths during their treatment to contribute to their recovery: UTA Patient states these barriers may affect/interfere with their treatment: UTA Patient states these barriers may affect their return to the community: UTA Other important information patient would like considered in planning for their treatment: UTA  Discharge Plan:   Currently receiving community mental health services:  (UTA) Patient states concerns and preferences for aftercare planning are: UTA Patient states they will know when they are safe and ready for discharge when: UTA Does patient  have access to transportation?:   (UTA) Does patient have financial barriers related to discharge medications?:  (UTA) Patient description of barriers related to discharge medications: UTA Will patient be returning to same living situation after discharge?:  (UTA)  Summary/Recommendations:   Summary and Recommendations (to be completed by the evaluator): Patient is a 63 year-old female from [place], [place] [facility]). According to H&P, 63 year old female with a longstanding history of paranoid schizophrenia and medical comorbidities including bradycardia, anemia, and malnutrition, admitted on May 02, 2024, for dehydration, hypoglycemia, and failure to thrive. Upon assessment pt is visibly and audibly responding to internal stimuli and becomes easily agitated upon further questioning. CSW attempted to contact pt's legal guardian, [name] through multiple attempts and unfortunately have been unable to reach. CSW did confirm that Paula is pt's current legal guardian per the [facility] of [address]. Pt's primary diagnosis is Schizophrenia (HCC) (F20.9). Recommendations include: crisis stabilization, therapeutic milieu, encourage group attendance and participation, medication management for mood stabilization and development of comprehensive mental wellness/sobriety plan.  [name]. 05/10/2024

## 2024-05-10 NOTE — [facility] Counselor (Signed)
 CSW contacted [facility]PS to report that pt's guardian has been unreachable.   CSW submitted report.   CSW awaits response from APS at this time regarding the report.   [name] Croft, MSW, [place] 05/10/2024 11:02 AM

## 2024-05-10 NOTE — Progress Notes (Signed)
 [facility] MD Progress Note  05/10/2024 11:02 AM Defne Gerling  MRN:  [id number]  Kandra Graven is a 63 y.o. female admitted: Medicallyfor 05/02/2024 10:56 AM for dehydration, failure to thrive and hypoglycemia. She carries the psychiatric diagnoses of Schizophrenia and has medical hx of hypoglycemia and failure to thrive and has a past medical history of sinus bradycardia, chronic anemia, severe malnutrition and recurrent hypoglycemia.. Her current presentation of verbal aggression, agitation and refusal to eat with severe paranoia is most consistent with paranoid schizophrenia. [name]  She was at [facility] where she was admitted on 04/30/2024.  She was transferred to the ED because of failure to thrive and was admitted to medical floor.Patient is on IVC and patient has been adamantly refusing all oral medications and refusing to eat.  Patient is admitted to [facility] unit with Q15 min safety monitoring. Multidisciplinary team approach is offered. Medication management; group/milieu therapy is offered.    Subjective:  Chart reviewed, case discussed in multidisciplinary meeting, patient seen during rounds.  Patient is noted to be pacing in her room.  Today she is less hostile but remains irritable.  She talks about government trying to manipulate things and continues to make very disorganized statements when asked about her health and any side effects to medications she reports she is drinking her body to her size .  She denies auditory/visual hallucinations but is clearly responding to internal stimuli.  She denies feeling depressed or anxious and when asked about suicidal/homicidal ideation she gets upset and states she is not crazy.  Per nursing report patient requested IM Zyprexa  last night and today morning she was able to take her oral Zyprexa  scheduled.  Social work team is reaching out to DSS as we are unable to locate patient's legal guardian.   Sleep: Fair  Appetite:  Poor  Past Psychiatric History: see  h&P Family History: History reviewed. No pertinent family history. Social History:  Social History   Substance and Sexual Activity  Alcohol Use Not Currently   Comment: Refuses to disclose how much     Social History   Substance and Sexual Activity  Drug Use No   Comment: Refuses to answer    Social History   Socioeconomic History   Marital status: Single    Spouse name: Not on file   Number of children: Not on file   Years of education: Not on file   Highest education level: Not on file  Occupational History   Not on file  Tobacco Use   Smoking status: Every Day    Current packs/day: 2.00    Types: Cigarettes    Passive exposure: Current   Smokeless tobacco: Never  Vaping Use   Vaping status: Never Used  Substance and Sexual Activity   Alcohol use: Not Currently    Comment: Refuses to disclose how much   Drug use: No    Comment: Refuses to answer   Sexual activity: Not Currently    Comment: refused to answer  Other Topics Concern   Not on file  Social History Narrative   Not on file   Social Drivers of Health   Financial Resource Strain: Not on file  Food Insecurity: Patient Declined (05/06/2024)   Hunger Vital Sign    Worried About Running Out of Food in the Last Year: Patient declined    Ran Out of Food in the Last Year: Patient declined  Transportation Needs: Patient Declined (05/06/2024)   PRAPARE - Administrator, Civil Service (  Medical): Patient declined    Lack of Transportation (Non-Medical): Patient declined  Physical Activity: Not on file  Stress: Not on file  Social Connections: Not on file   Past Medical History:  Past Medical History:  Diagnosis Date   Anemia 10/02/2021   Non compliance w medication regimen    Schizophrenia (HCC)     Past Surgical History:  Procedure Laterality Date   BIOPSY  09/25/2021   Procedure: BIOPSY;  Surgeon: Teressa Toribio SQUIBB, MD;  Location: [facility];  Service: Gastroenterology;;    ESOPHAGOGASTRODUODENOSCOPY (EGD) WITH PROPOFOL  [name] 09/25/2021   Procedure: ESOPHAGOGASTRODUODENOSCOPY (EGD) WITH PROPOFOL ;  Surgeon: Teressa Toribio SQUIBB, MD;  Location: [facility];  Service: Gastroenterology;  Laterality: [name];  With NGT placement    Current Medications: Current Facility-Administered Medications  Medication Dose Route Frequency Provider Last Rate Last Admin   acetaminophen  (TYLENOL ) tablet 650 mg  650 mg Oral Q6H PRN Starkes-Perry, Takia S, FNP       alum & mag hydroxide-simeth (MAALOX/MYLANTA) 200-200-20 MG/5ML suspension 30 mL  30 mL Oral Q4H PRN Starkes-Perry, Takia S, FNP       HYDROcodone-acetaminophen  (NORCO/VICODIN) 5-325 MG per tablet 1-2 tablet  1-2 tablet Oral Q4H PRN Starkes-Perry, Takia S, FNP       magnesium  hydroxide (MILK OF MAGNESIA) suspension 30 mL  30 mL Oral Daily PRN Starkes-Perry, Takia S, FNP       multivitamin with minerals tablet 1 tablet  1 tablet Oral Daily Wilkie Majel RAMAN, FNP   1 tablet at 05/10/24 9076   OLANZapine  (ZYPREXA ) tablet 5 mg  5 mg Oral BID Wilkie Majel RAMAN, FNP   5 mg at 05/10/24 9076   Or   OLANZapine  (ZYPREXA ) injection 5 mg  5 mg Intramuscular BID Starkes-Perry, Takia S, FNP   5 mg at 05/09/24 2139   OLANZapine  (ZYPREXA ) injection 5 mg  5 mg Intramuscular TID PRN Starkes-Perry, Takia S, FNP       OLANZapine  zydis (ZYPREXA ) disintegrating tablet 5 mg  5 mg Oral TID PRN Starkes-Perry, Takia S, FNP       ondansetron  (ZOFRAN ) tablet 4 mg  4 mg Oral Q6H PRN Starkes-Perry, Majel RAMAN, FNP       Or   ondansetron  (ZOFRAN ) injection 4 mg  4 mg Intravenous Q6H PRN Starkes-Perry, Majel RAMAN, FNP       thiamine  (VITAMIN B1) tablet 100 mg  100 mg Oral Daily Wilkie Majel RAMAN, FNP   100 mg at 05/10/24 9076   traZODone  (DESYREL ) tablet 50 mg  50 mg Oral QHS Wilkie Majel RAMAN, FNP        Lab Results: No results found for this or any previous visit (from the past 48 hours).  Blood Alcohol level:  Lab Results  Component Value Date    [facility] <15 05/02/2024   ETH <15 05/01/2024    Metabolic Disorder Labs: Lab Results  Component Value Date   HGBA1C 5.7 (H) 05/02/2024   MPG 116.89 05/02/2024   MPG 114 10/21/2021   Lab Results  Component Value Date   PROLACTIN 13.6 04/16/2015   Lab Results  Component Value Date   CHOL 222 (H) 05/01/2024   TRIG 63 05/01/2024   HDL 79 05/01/2024   CHOLHDL 2.8 05/01/2024   VLDL 13 05/01/2024   LDLCALC 130 (H) 05/01/2024   LDLCALC 129 (H) 10/21/2021    Physical Findings: AIMS:  , ,  ,  ,    CIWA:    COWS:      Psychiatric  Specialty Exam:  Presentation  General Appearance:  Casual  Eye Contact: Fair  Speech: Pressured  Speech Volume: Increased    Mood and Affect  Mood: Angry  Affect: Congruent   Thought Process  Thought Processes: Disorganized  Descriptions of Associations:Tangential  Orientation:Partial  Thought Content:Illogical; Tangential  Hallucinations:RESPONDING TO INTERNAL STIMULI Ideas of Reference:Paranoia; Delusions; Percusatory  Suicidal Thoughts:not endorsing Homicidal Thoughts:nor endorsing  Sensorium  Memory: Recent Poor  Judgment: Poor  Insight: Poor   Executive Functions  Concentration: Poor  Attention Span: Poor  Recall: Poor  Fund of Knowledge: Poor  Language: Poor   Psychomotor Activity  Psychomotor Activity:No data recorded Musculoskeletal: Strength & Muscle Tone: within normal limits Gait & Station: normal Assets  Assets: Resilience    Physical Exam: Physical Exam Vitals and nursing note reviewed.    ROS Blood pressure (!) 97/45, pulse (!) 52, temperature (!) 97.5 F (36.4 C), resp. rate 16, height 5' 4 (1.626 m), weight 44.9 kg, SpO2 100%. Body mass index is 16.99 kg/m.  Diagnosis: Active Problems:   Schizophrenia, paranoid (HCC) Paranoid  PLAN: Safety and Monitoring:  -- Involuntary admission to inpatient psychiatric unit for safety, stabilization and treatment  -- Daily  contact with patient to assess and evaluate symptoms and progress in treatment  -- Patient's case to be discussed in multi-disciplinary team meeting  -- Observation Level : q15 minute checks  -- Vital signs:  q12 hours  -- Precautions: suicide, elopement, and assault -- Encouraged patient to participate in unit milieu and in scheduled group therapies  2. Psychiatric Diagnoses and Treatment:  2 physicians approved non emergent forced medications-Dr.Madaram and Dr.Gavriel Holzhauer  Zyprexa  5 mg bID oral or Im -non emergent forced medication  . Discharge Planning:   -- Social work and case management to assist with discharge planning and identification of hospital follow-up needs prior to discharge  -- Estimated LOS: 3-4 days  Allyn Foil, MD 05/10/2024, 11:02 AM

## 2024-05-10 NOTE — Group Note (Signed)
 Date:  05/10/2024 Time:  11:03 AM  Group Topic/Focus:  Making Healthy Choices:   The focus of this group is to help patients identify negative/unhealthy choices they were using prior to admission and identify positive/healthier coping strategies to replace them upon discharge.    Participation Level:  Did Not Attend   [name] 05/10/2024, 11:03 AM

## 2024-05-10 NOTE — BHH Counselor (Signed)
 CSW contacted [facility] Non-Emergency Line to do wellness check on pt's legal guardian.   CSW only has the address on file per the guardianship documents in 2021.   When CSW gave the address non-emergency line operator states that the address is invalid.   CSW did chart review to find another address.   No address found at this time.   CSW will contact APS regarding this matter.   [name] Croft, MSW, [place] 05/10/2024 9:55 AM

## 2024-05-10 NOTE — Plan of Care (Signed)
  Problem: Education: Goal: Emotional status will improve Outcome: Not Progressing Goal: Mental status will improve Outcome: Not Progressing   Problem: Activity: Goal: Interest or engagement in activities will improve Outcome: Not Progressing Goal: Sleeping patterns will improve Outcome: Progressing   Problem: Safety: Goal: Ability to remain free from injury will improve Outcome: Progressing

## 2024-05-10 NOTE — Progress Notes (Signed)
   05/10/24 1100  Psych Admission Type (Psych Patients Only)  Admission Status Involuntary  Psychosocial Assessment  Patient Complaints Isolation;Sadness  Eye Contact Brief  Facial Expression Sad  Affect Sad  Speech Soft  Interaction Minimal  Motor Activity Slow  Appearance/Hygiene In scrubs  Behavior Characteristics Cooperative  Mood Sad  Thought Process  Coherency Disorganized  Content Preoccupation  Delusions None reported or observed  Perception UTA  Hallucination None reported or observed  Judgment Poor  Confusion Mild  Danger to Self  Current suicidal ideation? Denies  Agreement Not to Harm Self Yes  Description of Agreement verbal  Danger to Others  Danger to Others None reported or observed

## 2024-05-10 NOTE — Progress Notes (Signed)
   05/10/24 0000  Psych Admission Type (Psych Patients Only)  Admission Status Involuntary  Psychosocial Assessment  Patient Complaints Irritability  Eye Contact Brief  Facial Expression Angry  Affect Irritable  Speech Loud  Interaction No initiation  Motor Activity Slow  Appearance/Hygiene In scrubs  Behavior Characteristics Cooperative;Irritable (agreed to take IM medication only)  Mood Irritable  Thought Process  Coherency Unable to assess  Content UTA  Delusions UTA  Perception UTA  Hallucination UTA  Judgment Poor  Confusion Mild  Danger to Self  Current suicidal ideation? Denies  Danger to Others  Danger to Others None reported or observed

## 2024-05-10 NOTE — Group Note (Signed)
 Date:  05/10/2024 Time:  4:22 PM  Group Topic/Focus:  Goals Group:   The focus of this group is to help patients establish daily goals to achieve during treatment and discuss how the patient can incorporate goal setting into their daily lives to aide in recovery.    Participation Level:  Did Not Attend   [name] 05/10/2024, 4:22 PM

## 2024-05-11 NOTE — Progress Notes (Signed)
 [facility] MD Progress Note  05/11/2024 12:17 PM [name]  MRN:  [id number]  [name] is a 63 y.o. female admitted: Medicallyfor 05/02/2024 10:56 AM for dehydration, failure to thrive and hypoglycemia. She carries the psychiatric diagnoses of Schizophrenia and has medical hx of hypoglycemia and failure to thrive and has a past medical history of sinus bradycardia, chronic anemia, severe malnutrition and recurrent hypoglycemia.. Her current presentation of verbal aggression, agitation and refusal to eat with severe paranoia is most consistent with paranoid schizophrenia. [name]  She was at [facility] where she was admitted on 04/30/2024.  She was transferred to the ED because of failure to thrive and was admitted to medical floor.Patient is on IVC and patient has been adamantly refusing all oral medications and refusing to eat.  Patient is admitted to [facility] unit with Q15 min safety monitoring. Multidisciplinary team approach is offered. Medication management; group/milieu therapy is offered.    Subjective:  Chart reviewed, case discussed in multidisciplinary meeting, patient seen during rounds.   Social work team is reaching out to DSS as we are unable to locate patient's legal guardian.   Sleep: Fair  Appetite:  Poor  Past Psychiatric History: see h&P Family History: History reviewed. No pertinent family history. Social History:  Social History   Substance and Sexual Activity  Alcohol Use Not Currently   Comment: Refuses to disclose how much     Social History   Substance and Sexual Activity  Drug Use No   Comment: Refuses to answer    Social History   Socioeconomic History   Marital status: Single    Spouse name: Not on file   Number of children: Not on file   Years of education: Not on file   Highest education level: Not on file  Occupational History   Not on file  Tobacco Use   Smoking status: Every Day    Current packs/day: 2.00    Types: Cigarettes    Passive exposure:  Current   Smokeless tobacco: Never  Vaping Use   Vaping status: Never Used  Substance and Sexual Activity   Alcohol use: Not Currently    Comment: Refuses to disclose how much   Drug use: No    Comment: Refuses to answer   Sexual activity: Not Currently    Comment: refused to answer  Other Topics Concern   Not on file  Social History Narrative   Not on file   Social Drivers of Health   Financial Resource Strain: Not on file  Food Insecurity: Patient Declined (05/06/2024)   Hunger Vital Sign    Worried About Running Out of Food in the Last Year: Patient declined    Ran Out of Food in the Last Year: Patient declined  Transportation Needs: Patient Declined (05/06/2024)   PRAPARE - Administrator, Civil Service (Medical): Patient declined    Lack of Transportation (Non-Medical): Patient declined  Physical Activity: Not on file  Stress: Not on file  Social Connections: Not on file   Past Medical History:  Past Medical History:  Diagnosis Date   Anemia 10/02/2021   Non compliance w medication regimen    Schizophrenia (HCC)     Past Surgical History:  Procedure Laterality Date   BIOPSY  09/25/2021   Procedure: BIOPSY;  Surgeon: Teressa Toribio SQUIBB, MD;  Location: [facility];  Service: Gastroenterology;;   ESOPHAGOGASTRODUODENOSCOPY (EGD) WITH PROPOFOL  [name] 09/25/2021   Procedure: ESOPHAGOGASTRODUODENOSCOPY (EGD) WITH PROPOFOL ;  Surgeon: Teressa Toribio SQUIBB, MD;  Location: [facility];  Service: Gastroenterology;  Laterality: [name];  With NGT placement    Current Medications: Current Facility-Administered Medications  Medication Dose Route Frequency Provider Last Rate Last Admin   acetaminophen  (TYLENOL ) tablet 650 mg  650 mg Oral Q6H PRN Starkes-Perry, Takia S, FNP       alum & mag hydroxide-simeth (MAALOX/MYLANTA) 200-200-20 MG/5ML suspension 30 mL  30 mL Oral Q4H PRN Starkes-Perry, Takia S, FNP       HYDROcodone-acetaminophen  (NORCO/VICODIN) 5-325 MG per tablet 1-2  tablet  1-2 tablet Oral Q4H PRN Starkes-Perry, Takia S, FNP       magnesium  hydroxide (MILK OF MAGNESIA) suspension 30 mL  30 mL Oral Daily PRN Starkes-Perry, Takia S, FNP       multivitamin with minerals tablet 1 tablet  1 tablet Oral Daily Starkes-Perry, Takia S, FNP   1 tablet at 05/11/24 9063   OLANZapine  (ZYPREXA ) tablet 5 mg  5 mg Oral BID Wilkie Majel RAMAN, FNP   5 mg at 05/11/24 9063   Or   OLANZapine  (ZYPREXA ) injection 5 mg  5 mg Intramuscular BID Starkes-Perry, Takia S, FNP   5 mg at 05/09/24 2139   OLANZapine  (ZYPREXA ) injection 5 mg  5 mg Intramuscular TID PRN Starkes-Perry, Takia S, FNP       OLANZapine  zydis (ZYPREXA ) disintegrating tablet 5 mg  5 mg Oral TID PRN Starkes-Perry, Takia S, FNP       ondansetron  (ZOFRAN ) tablet 4 mg  4 mg Oral Q6H PRN Wilkie Majel RAMAN, FNP       Or   ondansetron  (ZOFRAN ) injection 4 mg  4 mg Intravenous Q6H PRN Starkes-Perry, Majel RAMAN, FNP       thiamine  (VITAMIN B1) tablet 100 mg  100 mg Oral Daily Wilkie Majel RAMAN, FNP   100 mg at 05/11/24 9063   traZODone  (DESYREL ) tablet 50 mg  50 mg Oral QHS Wilkie Majel RAMAN, FNP        Lab Results: No results found for this or any previous visit (from the past 48 hours).  Blood Alcohol level:  Lab Results  Component Value Date   [facility] <15 05/02/2024   ETH <15 05/01/2024    Metabolic Disorder Labs: Lab Results  Component Value Date   HGBA1C 5.7 (H) 05/02/2024   MPG 116.89 05/02/2024   MPG 114 10/21/2021   Lab Results  Component Value Date   PROLACTIN 13.6 04/16/2015   Lab Results  Component Value Date   CHOL 222 (H) 05/01/2024   TRIG 63 05/01/2024   HDL 79 05/01/2024   CHOLHDL 2.8 05/01/2024   VLDL 13 05/01/2024   LDLCALC 130 (H) 05/01/2024   LDLCALC 129 (H) 10/21/2021    Physical Findings: AIMS:  , ,  ,  ,    CIWA:    COWS:      Psychiatric Specialty Exam:  Presentation  General Appearance:  Casual  Eye Contact: Fair  Speech: Pressured  Speech  Volume: Increased    Mood and Affect  Mood: Angry  Affect: Congruent   Thought Process  Thought Processes: Disorganized  Descriptions of Associations:Tangential  Orientation:Partial  Thought Content:Illogical; Tangential  Hallucinations:RESPONDING TO INTERNAL STIMULI Ideas of Reference:Paranoia; Delusions; Percusatory  Suicidal Thoughts:not endorsing Homicidal Thoughts:nor endorsing  Sensorium  Memory: Recent Poor  Judgment: Poor  Insight: Poor   Executive Functions  Concentration: Poor  Attention Span: Poor  Recall: Poor  Fund of Knowledge: Poor  Language: Poor   Psychomotor Activity  Psychomotor Activity:No data recorded Musculoskeletal: Strength &  Muscle Tone: within normal limits Gait & Station: normal Assets  Assets: Resilience    Physical Exam: Physical Exam Vitals and nursing note reviewed.    ROS Blood pressure 92/61, pulse (!) 48, temperature 97.9 F (36.6 C), resp. rate 16, height 5' 4 (1.626 m), weight 44.9 kg, SpO2 100%. Body mass index is 16.99 kg/m.  Diagnosis: Active Problems:   Schizophrenia, paranoid (HCC) Paranoid  PLAN: Safety and Monitoring:  -- Involuntary admission to inpatient psychiatric unit for safety, stabilization and treatment  -- Daily contact with patient to assess and evaluate symptoms and progress in treatment  -- Patient's case to be discussed in multi-disciplinary team meeting  -- Observation Level : q15 minute checks  -- Vital signs:  q12 hours  -- Precautions: suicide, elopement, and assault -- Encouraged patient to participate in unit milieu and in scheduled group therapies  2. Psychiatric Diagnoses and Treatment:  2 physicians approved non emergent forced medications-Dr.Madaram and Dr.Makalyn Lennox  Zyprexa  5 mg bID oral or Im -non emergent forced medication  . Discharge Planning:   -- Social work and case management to assist with discharge planning and identification of hospital  follow-up needs prior to discharge  -- Estimated LOS: 3-4 days  Allyn Foil, MD 05/11/2024, 12:17 PM

## 2024-05-11 NOTE — Group Note (Signed)
 Date:  05/11/2024 Time:  3:39 PM  Group Topic/Focus:  Overcoming Stress:   The focus of this group is to define stress and help patients assess their triggers.    Participation Level:  Did Not Attend    [name] Bias 05/11/2024, 3:39 PM

## 2024-05-11 NOTE — Plan of Care (Signed)
  Problem: Activity: Goal: Sleeping patterns will improve Outcome: Progressing   Problem: Health Behavior/Discharge Planning: Goal: Compliance with treatment plan for underlying cause of condition will improve Outcome: Progressing

## 2024-05-11 NOTE — BHH Counselor (Signed)
 Pt was visited by caseworker [employer].   Daphene reports that pt's previous guardian, [name], has passed away.   Daphene reports at this time pt's brother is interested in becoming pt's guardian but that court dates need to be established and that she will contact CSW with updates.   CSW informed pt's care team.   [name] Croft, MSW, [facility] 05/17/2024 9:00 AM

## 2024-05-11 NOTE — Progress Notes (Signed)
   05/10/24 2157  Psych Admission Type (Psych Patients Only)  Admission Status Involuntary  Psychosocial Assessment  Patient Complaints Isolation;Sadness  Eye Contact Brief  Facial Expression Sad  Affect Sad  Speech Soft  Interaction Minimal  Motor Activity Slow  Appearance/Hygiene In scrubs  Behavior Characteristics Cooperative  Mood Sad  Thought Process  Coherency Disorganized  Content Preoccupation  Delusions None reported or observed  Perception UTA  Hallucination None reported or observed  Judgment Poor  Confusion Mild  Danger to Self  Current suicidal ideation? Denies  Agreement Not to Harm Self Yes  Description of Agreement verbal  Danger to Others  Danger to Others None reported or observed    Estimated Sleeping Duration (Last 24 Hours): 11.25-12.75 hours

## 2024-05-11 NOTE — Group Note (Signed)
 Recreation Therapy Group Note   Group Topic:General Recreation  Group Date: 05/11/2024 Start Time: 1400 End Time: 1455 Facilitators: Celestia Jeoffrey BRAVO, LRT, CTRS Location: Courtyard  Group Description: Outdoor Recreation. Patients had the option to play corn hole, ring toss, UNO, or listening to music while outside in the courtyard getting fresh air and sunlight. Patients helped water  and prune the raised garden beds. LRT and patients discussed things that they enjoy doing in their free time outside of the hospital. LRT encouraged patients to drink water  after being active and getting their heart rate up.  Goal Area(s) Addressed: Patient will identify leisure interests.  Patient will practice healthy decision making. Patient will engage in recreation activity.   Affect/Mood: [name]   Participation Level: Did not attend    Clinical Observations/Individualized Feedback: Patient did not attend group.   Plan: Continue to engage patient in RT group sessions 2-3x/week.   Jeoffrey BRAVO Celestia, LRT, CTRS 05/11/2024 4:37 PM

## 2024-05-11 NOTE — Group Note (Signed)
 Date:  05/11/2024 Time:  10:01 PM  Group Topic/Focus:  Identifying Needs:   The focus of this group is to help patients identify their personal needs that have been historically problematic and identify healthy behaviors to address their needs.    Participation Level:  Did Not Attend  Participation Quality:  Did Not Attend  Affect:  Did Not Attend  Cognitive:  Did Not Attend  Insight: None  Engagement in Group:  None  Modes of Intervention:  Did Not Attend  Additional Comments:  Did Not Attend  Laymon ONEIDA Finder 05/11/2024, 10:01 PM

## 2024-05-11 NOTE — Plan of Care (Signed)
   Problem: Education: Goal: Knowledge of [facility] Education information/materials will improve Outcome: Progressing Goal: Emotional status will improve Outcome: Progressing Goal: Mental status will improve Outcome: Progressing Goal: Verbalization of understanding the information provided will improve Outcome: Progressing

## 2024-05-11 NOTE — Group Note (Signed)
 Date:  05/11/2024 Time:  11:04 AM  Group Topic/Focus:  Healthy Communication:   The focus of this group is to discuss communication, barriers to communication, as well as healthy ways to communicate with others.    Participation Level:  Did Not Attend   [name] 05/11/2024, 11:04 AM

## 2024-05-12 NOTE — Group Note (Signed)
 Physical/Occupational Therapy Group Note  Group Topic: Estate manager/land agent, Adaptive Equipment for ADLs  Group Date: 05/12/2024 Start Time: 1310 End Time: 1345 Facilitators: Lavonda Therisa CROME, OT   Group Description: Group educated on safety considerations/modifications with bathing, dressing and ADL routines to maximize independence and minimize fall risk with tasks.  Reviewed and demonstrated use of shower chair and tub transfer bench as appropriate.  Reviewed and demonstrated use of adaptive equipment (sock aide, reacher, long-handled sponge) available for ADL tasks.  Reviewed and demonstrated role of activity pacing and energy conservation with functional activities.  Provided handout with visual reference of available equipment.  Therapeutic Goal(s):  Verbalize and demonstrate safe technique for tub/shower transfers with DME/adaptive equipment as needed. Verbalize and demonstrate appropriate use of adaptive equipment with bathing, dressing and ADL routine. Verbalize and demonstrate appropriate use of activity pacing and energy conservation with bathing, dressing and ADL routine.  Individual Participation: Pt did not attend   Participation Level: Did not attend   Participation Quality:    Behavior:    Speech/Thought Process:    Affect/Mood:    Insight:    Judgement:    Modes of Intervention:   Patient Response to Interventions:     Plan: Continue to engage patient in PT/OT groups 1 - 2x/week.  05/12/2024  Therisa CROME Lavonda, OT Therisa Lavonda, OTD OTR/L  05/12/24, 3:29 PM

## 2024-05-12 NOTE — Progress Notes (Signed)
   05/12/24 0206  Psych Admission Type (Psych Patients Only)  Admission Status Involuntary  Psychosocial Assessment  Patient Complaints Isolation  Eye Contact Brief  Facial Expression Sullen  Affect Sad  Speech Soft  Interaction Minimal  Motor Activity Slow  Appearance/Hygiene In scrubs  Behavior Characteristics Cooperative  Mood Sad  Thought Process  Coherency Disorganized  Content Preoccupation  Delusions None reported or observed  Perception Hallucinations  Hallucination Auditory  Judgment Impaired  Confusion Mild  Danger to Self  Current suicidal ideation? Denies  Description of Suicide Plan n/a  Self-Injurious Behavior No self-injurious ideation or behavior indicators observed or expressed   Agreement Not to Harm Self Yes  Description of Agreement verbal  Danger to Others  Danger to Others None reported or observed    Estimated Sleeping Duration (Last 24 Hours): 10.00-11.75 hours

## 2024-05-12 NOTE — Progress Notes (Signed)
 [facility] MD Progress Note  05/12/2024 4:01 PM [name]  MRN:  [id number]  [name] is a 63 y.o. female admitted: Medicallyfor 05/02/2024 10:56 AM for dehydration, failure to thrive and hypoglycemia. She carries the psychiatric diagnoses of Schizophrenia and has medical hx of hypoglycemia and failure to thrive and has a past medical history of sinus bradycardia, chronic anemia, severe malnutrition and recurrent hypoglycemia.. Her current presentation of verbal aggression, agitation and refusal to eat with severe paranoia is most consistent with paranoid schizophrenia. [name]  She was at [facility] where she was admitted on 04/30/2024.  She was transferred to the ED because of failure to thrive and was admitted to medical floor.Patient is on IVC and patient has been adamantly refusing all oral medications and refusing to eat.  Patient is admitted to [facility] unit with Q15 min safety monitoring. Multidisciplinary team approach is offered. Medication management; group/milieu therapy is offered.    Subjective:  Chart reviewed, case discussed in multidisciplinary meeting, patient seen during rounds.  Patient is noted to be standing in her room.  She is calm.  She remains verbally minimal and refused to answer questions related to mental health.  When asked about her appetite and food intake she dismisses the provider saying she had an off.  Per nursing patient is coming out of her room during meals.  Patient is not participating in any of the groups.  Patient is taking the oral medications without requiring IM nonemergent forced medication.  Patient continues to display disorganized thought process.  Social work team is reaching out to DSS as we are unable to locate patient's legal guardian.  APS confirmed that her legal guardian has passed away and the brother is currently applying for guardianship.   Sleep: Fair  Appetite:  Poor  Past Psychiatric History: see h&P Family History: History reviewed. No pertinent  family history. Social History:  Social History   Substance and Sexual Activity  Alcohol Use Not Currently   Comment: Refuses to disclose how much     Social History   Substance and Sexual Activity  Drug Use No   Comment: Refuses to answer    Social History   Socioeconomic History   Marital status: Single    Spouse name: Not on file   Number of children: Not on file   Years of education: Not on file   Highest education level: Not on file  Occupational History   Not on file  Tobacco Use   Smoking status: Every Day    Current packs/day: 2.00    Types: Cigarettes    Passive exposure: Current   Smokeless tobacco: Never  Vaping Use   Vaping status: Never Used  Substance and Sexual Activity   Alcohol use: Not Currently    Comment: Refuses to disclose how much   Drug use: No    Comment: Refuses to answer   Sexual activity: Not Currently    Comment: refused to answer  Other Topics Concern   Not on file  Social History Narrative   Not on file   Social Drivers of Health   Financial Resource Strain: Not on file  Food Insecurity: Patient Declined (05/06/2024)   Hunger Vital Sign    Worried About Running Out of Food in the Last Year: Patient declined    Ran Out of Food in the Last Year: Patient declined  Transportation Needs: Patient Declined (05/06/2024)   PRAPARE - Administrator, Civil Service (Medical): Patient declined  Lack of Transportation (Non-Medical): Patient declined  Physical Activity: Not on file  Stress: Not on file  Social Connections: Not on file   Past Medical History:  Past Medical History:  Diagnosis Date   Anemia 10/02/2021   Non compliance w medication regimen    Schizophrenia [facility])     Past Surgical History:  Procedure Laterality Date   BIOPSY  09/25/2021   Procedure: BIOPSY;  Surgeon: Teressa Toribio SQUIBB, MD;  Location: [facility];  Service: Gastroenterology;;   ESOPHAGOGASTRODUODENOSCOPY (EGD) WITH PROPOFOL  [name] 09/25/2021    Procedure: ESOPHAGOGASTRODUODENOSCOPY (EGD) WITH PROPOFOL ;  Surgeon: Teressa Toribio SQUIBB, MD;  Location: [facility];  Service: Gastroenterology;  Laterality: [name];  With NGT placement    Current Medications: Current Facility-Administered Medications  Medication Dose Route Frequency Provider Last Rate Last Admin   acetaminophen  (TYLENOL ) tablet 650 mg  650 mg Oral Q6H PRN Starkes-Perry, Takia S, FNP       alum & mag hydroxide-simeth (MAALOX/MYLANTA) 200-200-20 MG/5ML suspension 30 mL  30 mL Oral Q4H PRN Starkes-Perry, Takia S, FNP       HYDROcodone-acetaminophen  (NORCO/VICODIN) 5-325 MG per tablet 1-2 tablet  1-2 tablet Oral Q4H PRN Starkes-Perry, Takia S, FNP       magnesium  hydroxide (MILK OF MAGNESIA) suspension 30 mL  30 mL Oral Daily PRN Starkes-Perry, Takia S, FNP       multivitamin with minerals tablet 1 tablet  1 tablet Oral Daily Starkes-Perry, Takia S, FNP   1 tablet at 05/12/24 9076   OLANZapine  (ZYPREXA ) tablet 5 mg  5 mg Oral BID Wilkie Majel RAMAN, FNP   5 mg at 05/12/24 9075   Or   OLANZapine  (ZYPREXA ) injection 5 mg  5 mg Intramuscular BID Starkes-Perry, Takia S, FNP   5 mg at 05/09/24 2139   OLANZapine  (ZYPREXA ) injection 5 mg  5 mg Intramuscular TID PRN Starkes-Perry, Takia S, FNP       OLANZapine  zydis (ZYPREXA ) disintegrating tablet 5 mg  5 mg Oral TID PRN Starkes-Perry, Takia S, FNP       ondansetron  (ZOFRAN ) tablet 4 mg  4 mg Oral Q6H PRN Starkes-Perry, Majel RAMAN, FNP       Or   ondansetron  (ZOFRAN ) injection 4 mg  4 mg Intravenous Q6H PRN Starkes-Perry, Majel RAMAN, FNP       thiamine  (VITAMIN B1) tablet 100 mg  100 mg Oral Daily Wilkie Majel RAMAN, FNP   100 mg at 05/12/24 9075   traZODone  (DESYREL ) tablet 50 mg  50 mg Oral QHS Wilkie Majel RAMAN, FNP        Lab Results: No results found for this or any previous visit (from the past 48 hours).  Blood Alcohol level:  Lab Results  Component Value Date   [facility] <15 05/02/2024   ETH <15 05/01/2024    Metabolic  Disorder Labs: Lab Results  Component Value Date   HGBA1C 5.7 (H) 05/02/2024   MPG 116.89 05/02/2024   MPG 114 10/21/2021   Lab Results  Component Value Date   PROLACTIN 13.6 04/16/2015   Lab Results  Component Value Date   CHOL 222 (H) 05/01/2024   TRIG 63 05/01/2024   HDL 79 05/01/2024   CHOLHDL 2.8 05/01/2024   VLDL 13 05/01/2024   LDLCALC 130 (H) 05/01/2024   LDLCALC 129 (H) 10/21/2021    Physical Findings: AIMS:  , ,  ,  ,    CIWA:    COWS:      Psychiatric Specialty Exam:  Presentation  General  Appearance:  Casual  Eye Contact: Fair  Speech: Pressured  Speech Volume: Increased    Mood and Affect  Mood: Angry  Affect: Congruent   Thought Process  Thought Processes: Disorganized  Descriptions of Associations:Tangential  Orientation:Partial  Thought Content:Illogical; Tangential  Hallucinations:RESPONDING TO INTERNAL STIMULI Ideas of Reference:Paranoia; Delusions; Percusatory  Suicidal Thoughts:not endorsing Homicidal Thoughts:nor endorsing  Sensorium  Memory: Recent Poor  Judgment: Poor  Insight: Poor   Executive Functions  Concentration: Poor  Attention Span: Poor  Recall: Poor  Fund of Knowledge: Poor  Language: Poor   Psychomotor Activity  Psychomotor Activity:No data recorded Musculoskeletal: Strength & Muscle Tone: within normal limits Gait & Station: normal Assets  Assets: Resilience    Physical Exam: Physical Exam Vitals and nursing note reviewed.    ROS Blood pressure 104/66, pulse (!) 49, temperature (!) 97.2 F (36.2 C), resp. rate 18, height 5' 4 (1.626 m), weight 44.9 kg, SpO2 100%. Body mass index is 16.99 kg/m.  Diagnosis: Active Problems:   Schizophrenia, paranoid (HCC) Paranoid  PLAN: Safety and Monitoring:  -- Involuntary admission to inpatient psychiatric unit for safety, stabilization and treatment  -- Daily contact with patient to assess and evaluate symptoms and  progress in treatment  -- Patient's case to be discussed in multi-disciplinary team meeting  -- Observation Level : q15 minute checks  -- Vital signs:  q12 hours  -- Precautions: suicide, elopement, and assault -- Encouraged patient to participate in unit milieu and in scheduled group therapies  2. Psychiatric Diagnoses and Treatment:  2 physicians approved non emergent forced medications-Dr.Madaram and Dr.Kimberlyn Quiocho  Zyprexa  5 mg bID oral or Im -non emergent forced medication  . Discharge Planning:   -- Social work and case management to assist with discharge planning and identification of hospital follow-up needs prior to discharge  -- Estimated LOS: 3-4 days  Allyn Foil, MD 05/12/2024, 4:01 PM

## 2024-05-12 NOTE — Group Note (Signed)
 Recreation Therapy Group Note   Group Topic:Stress Management  Group Date: 05/12/2024 Start Time: 1415 End Time: 1455 Facilitators: Celestia Jeoffrey BRAVO, LRT, CTRS Location: Courtyard  Group Description: Meditation. LRT and patients discussed what they know about meditation and mindfulness. LRT played a Deep Breathing Meditation exercise script for patients to follow along to. LRT and patients discussed how meditation and deep breathing can be used as a coping skill post--discharge to help manage symptoms of stress.   Goal Area(s) Addressed: Patient will practice using relaxation technique. Patient will identify a new coping skill.  Patient will follow multistep directions to reduce anxiety and stress.   Affect/Mood: [name]   Participation Level: Did not attend    Clinical Observations/Individualized Feedback: Patient did not attend group.  Plan: Continue to engage patient in RT group sessions 2-3x/week.   Jeoffrey BRAVO Celestia, LRT, CTRS 05/12/2024 4:22 PM

## 2024-05-12 NOTE — Plan of Care (Signed)
  Problem: Activity: Goal: Sleeping patterns will improve Outcome: Progressing   Problem: Coping: Goal: Level of anxiety will decrease Outcome: Progressing

## 2024-05-12 NOTE — Group Note (Signed)
 Date:  05/12/2024 Time:  11:03 AM  Group Topic/Focus:  Self Care:   The focus of this group is to help patients understand the importance of self-care in order to improve or restore emotional, physical, spiritual, interpersonal, and financial health.    Participation Level:  Did Not Attend   [name] 05/12/2024, 11:03 AM

## 2024-05-13 NOTE — Progress Notes (Signed)
 Behavior:  Cooperative.  Guarded and restless.   Psych assessment:  Denies SI. Unwilling to answer any more assessment questions.  Interaction / Group attendance:  Isolates to room.  Does not attend group.  Present in milieu for meals only.   Medication/ PRNs: Compliant  Pain: Denies  15 min checks in place for safety.

## 2024-05-13 NOTE — Plan of Care (Signed)
  Problem: Activity: Goal: Sleeping patterns will improve Outcome: Progressing   

## 2024-05-13 NOTE — Plan of Care (Signed)
  Problem: Coping: Goal: Ability to demonstrate self-control will improve Outcome: Progressing   Problem: Education: Goal: Mental status will improve Outcome: Not Progressing   Problem: Activity: Goal: Interest or engagement in activities will improve Outcome: Not Progressing

## 2024-05-13 NOTE — Group Note (Signed)
 Date:  05/13/2024 Time:  10:47 AM  Group Topic/Focus:  Goals/Overcoming Obstacles Group:   The focus of this group is to help patients establish daily goals to achieve during treatment and discuss how the patient can incorporate goal setting into their daily lives to aide in recovery. Patients also went over a overcoming obstacles worksheet and learned what the difference between each obstacle they are facing.     Participation Level:  Did Not Attend  Beatris ONEIDA Hasten 05/13/2024, 10:47 AM

## 2024-05-13 NOTE — Group Note (Signed)
 Date:  05/13/2024 Time:  5:30 PM  Group Topic/Focus:  Healthy Communication:   The focus of this group is to discuss communication, barriers to communication, as well as healthy ways to communicate with others.    Participation Level:  Did Not Attend   [name] 05/13/2024, 5:30 PM

## 2024-05-13 NOTE — Progress Notes (Signed)
 [facility] MD Progress Note  05/13/2024 1:30 PM [name]  MRN:  [id number]  [name] is a 63 y.o. female admitted: Medicallyfor 05/02/2024 10:56 AM for dehydration, failure to thrive and hypoglycemia. She carries the psychiatric diagnoses of Schizophrenia and has medical hx of hypoglycemia and failure to thrive and has a past medical history of sinus bradycardia, chronic anemia, severe malnutrition and recurrent hypoglycemia.. Her current presentation of verbal aggression, agitation and refusal to eat with severe paranoia is most consistent with paranoid schizophrenia. [name]  She was at [facility] where she was admitted on 04/30/2024.  She was transferred to the ED because of failure to thrive and was admitted to medical floor.Patient is on IVC and patient has been adamantly refusing all oral medications and refusing to eat.  Patient is admitted to [facility] unit with Q15 min safety monitoring. Multidisciplinary team approach is offered. Medication management; group/milieu therapy is offered.    Subjective:  Chart reviewed, case discussed in multidisciplinary meeting, patient seen during rounds.   Patient is noted to be sitting in her room.  She remains verbally minimal and gets very irritable answering questions related to mental health.  She answers questions regarding general health appropriately.  She offers no physical complaints.  When asked about her appetite she got upset asking why everybody is focused on her appetite.  She denies SI/HI/plan.  When asked about hallucinations she got upset and refused to respond.  She displays disorganized statements in between.  Per nursing patient is taking her medications. Social work team is reaching out to DSS as we are unable to locate patient's legal guardian.  APS confirmed that her legal guardian has passed away and the brother is currently applying for guardianship.   Sleep: Fair  Appetite:  Poor  Past Psychiatric History: see h&P Family History: History  reviewed. No pertinent family history. Social History:  Social History   Substance and Sexual Activity  Alcohol Use Not Currently   Comment: Refuses to disclose how much     Social History   Substance and Sexual Activity  Drug Use No   Comment: Refuses to answer    Social History   Socioeconomic History   Marital status: Single    Spouse name: Not on file   Number of children: Not on file   Years of education: Not on file   Highest education level: Not on file  Occupational History   Not on file  Tobacco Use   Smoking status: Every Day    Current packs/day: 2.00    Types: Cigarettes    Passive exposure: Current   Smokeless tobacco: Never  Vaping Use   Vaping status: Never Used  Substance and Sexual Activity   Alcohol use: Not Currently    Comment: Refuses to disclose how much   Drug use: No    Comment: Refuses to answer   Sexual activity: Not Currently    Comment: refused to answer  Other Topics Concern   Not on file  Social History Narrative   Not on file   Social Drivers of Health   Financial Resource Strain: Not on file  Food Insecurity: Patient Declined (05/06/2024)   Hunger Vital Sign    Worried About Running Out of Food in the Last Year: Patient declined    Ran Out of Food in the Last Year: Patient declined  Transportation Needs: Patient Declined (05/06/2024)   PRAPARE - Administrator, Civil Service (Medical): Patient declined    Lack of  Transportation (Non-Medical): Patient declined  Physical Activity: Not on file  Stress: Not on file  Social Connections: Not on file   Past Medical History:  Past Medical History:  Diagnosis Date   Anemia 10/02/2021   Non compliance w medication regimen    Schizophrenia [facility])     Past Surgical History:  Procedure Laterality Date   BIOPSY  09/25/2021   Procedure: BIOPSY;  Surgeon: Teressa Toribio SQUIBB, MD;  Location: [facility];  Service: Gastroenterology;;   ESOPHAGOGASTRODUODENOSCOPY (EGD) WITH  PROPOFOL  [name] 09/25/2021   Procedure: ESOPHAGOGASTRODUODENOSCOPY (EGD) WITH PROPOFOL ;  Surgeon: Teressa Toribio SQUIBB, MD;  Location: [facility];  Service: Gastroenterology;  Laterality: [name];  With NGT placement    Current Medications: Current Facility-Administered Medications  Medication Dose Route Frequency Provider Last Rate Last Admin   acetaminophen  (TYLENOL ) tablet 650 mg  650 mg Oral Q6H PRN Starkes-Perry, Takia S, FNP       alum & mag hydroxide-simeth (MAALOX/MYLANTA) 200-200-20 MG/5ML suspension 30 mL  30 mL Oral Q4H PRN Starkes-Perry, Takia S, FNP       HYDROcodone-acetaminophen  (NORCO/VICODIN) 5-325 MG per tablet 1-2 tablet  1-2 tablet Oral Q4H PRN Starkes-Perry, Takia S, FNP       magnesium  hydroxide (MILK OF MAGNESIA) suspension 30 mL  30 mL Oral Daily PRN Starkes-Perry, Takia S, FNP       multivitamin with minerals tablet 1 tablet  1 tablet Oral Daily Wilkie Majel RAMAN, FNP   1 tablet at 05/13/24 9076   OLANZapine  (ZYPREXA ) tablet 5 mg  5 mg Oral BID Wilkie Majel RAMAN, FNP   5 mg at 05/13/24 9076   Or   OLANZapine  (ZYPREXA ) injection 5 mg  5 mg Intramuscular BID Wilkie Majel RAMAN, FNP   5 mg at 05/09/24 2139   OLANZapine  (ZYPREXA ) injection 5 mg  5 mg Intramuscular TID PRN Starkes-Perry, Takia S, FNP       OLANZapine  zydis (ZYPREXA ) disintegrating tablet 5 mg  5 mg Oral TID PRN Starkes-Perry, Takia S, FNP       ondansetron  (ZOFRAN ) tablet 4 mg  4 mg Oral Q6H PRN Starkes-Perry, Majel RAMAN, FNP       Or   ondansetron  (ZOFRAN ) injection 4 mg  4 mg Intravenous Q6H PRN Starkes-Perry, Majel RAMAN, FNP       thiamine  (VITAMIN B1) tablet 100 mg  100 mg Oral Daily Wilkie Majel RAMAN, FNP   100 mg at 05/13/24 9076   traZODone  (DESYREL ) tablet 50 mg  50 mg Oral QHS Starkes-Perry, Takia S, FNP   50 mg at 05/12/24 2132    Lab Results: No results found for this or any previous visit (from the past 48 hours).  Blood Alcohol level:  Lab Results  Component Value Date   [facility] <15  05/02/2024   ETH <15 05/01/2024    Metabolic Disorder Labs: Lab Results  Component Value Date   HGBA1C 5.7 (H) 05/02/2024   MPG 116.89 05/02/2024   MPG 114 10/21/2021   Lab Results  Component Value Date   PROLACTIN 13.6 04/16/2015   Lab Results  Component Value Date   CHOL 222 (H) 05/01/2024   TRIG 63 05/01/2024   HDL 79 05/01/2024   CHOLHDL 2.8 05/01/2024   VLDL 13 05/01/2024   LDLCALC 130 (H) 05/01/2024   LDLCALC 129 (H) 10/21/2021    Physical Findings: AIMS:  , ,  ,  ,    CIWA:    COWS:      Psychiatric Specialty Exam:  Presentation  General Appearance:  Casual  Eye Contact: Fair  Speech: Pressured  Speech Volume: Increased    Mood and Affect  Mood: Angry  Affect: Congruent   Thought Process  Thought Processes: Disorganized  Descriptions of Associations:Tangential  Orientation:Partial  Thought Content:Illogical; Tangential  Hallucinations:RESPONDING TO INTERNAL STIMULI Ideas of Reference:Paranoia; Delusions; Percusatory  Suicidal Thoughts:not endorsing Homicidal Thoughts:nor endorsing  Sensorium  Memory: Recent Poor  Judgment: Poor  Insight: Poor   Executive Functions  Concentration: Poor  Attention Span: Poor  Recall: Poor  Fund of Knowledge: Poor  Language: Poor   Psychomotor Activity  Psychomotor Activity:No data recorded Musculoskeletal: Strength & Muscle Tone: within normal limits Gait & Station: normal Assets  Assets: Resilience    Physical Exam: Physical Exam Vitals and nursing note reviewed.    ROS Blood pressure (!) 91/53, pulse (!) 51, temperature 98.1 F (36.7 C), resp. rate 18, height 5' 4 (1.626 m), weight 44.9 kg, SpO2 100%. Body mass index is 16.99 kg/m.  Diagnosis: Active Problems:   Schizophrenia, paranoid (HCC) Paranoid  PLAN: Safety and Monitoring:  -- Involuntary admission to inpatient psychiatric unit for safety, stabilization and treatment  -- Daily contact with  patient to assess and evaluate symptoms and progress in treatment  -- Patient's case to be discussed in multi-disciplinary team meeting  -- Observation Level : q15 minute checks  -- Vital signs:  q12 hours  -- Precautions: suicide, elopement, and assault -- Encouraged patient to participate in unit milieu and in scheduled group therapies  2. Psychiatric Diagnoses and Treatment:  2 physicians approved non emergent forced medications-Dr.Madaram and Dr.Alicia Ackert  Zyprexa  5 mg bID oral or Im -non emergent forced medication  . Discharge Planning:   -- Social work and case management to assist with discharge planning and identification of hospital follow-up needs prior to discharge  -- Estimated LOS: 3-4 days  Allyn Foil, MD 05/13/2024, 1:30 PM

## 2024-05-13 NOTE — BH IP Treatment Plan (Signed)
 Interdisciplinary Treatment and Diagnostic Plan Update  05/13/2024 Time of Session: 1:00pm [name] MRN: [id number]  Principal Diagnosis: <principal problem not specified>  Secondary Diagnoses: Active Problems:   Schizophrenia, paranoid (HCC)   Current Medications:  Current Facility-Administered Medications  Medication Dose Route Frequency Provider Last Rate Last Admin   acetaminophen  (TYLENOL ) tablet 650 mg  650 mg Oral Q6H PRN Starkes-Perry, Takia S, FNP       alum & mag hydroxide-simeth (MAALOX/MYLANTA) 200-200-20 MG/5ML suspension 30 mL  30 mL Oral Q4H PRN Starkes-Perry, Takia S, FNP       HYDROcodone-acetaminophen  (NORCO/VICODIN) 5-325 MG per tablet 1-2 tablet  1-2 tablet Oral Q4H PRN Starkes-Perry, Takia S, FNP       magnesium  hydroxide (MILK OF MAGNESIA) suspension 30 mL  30 mL Oral Daily PRN Starkes-Perry, Takia S, FNP       multivitamin with minerals tablet 1 tablet  1 tablet Oral Daily Starkes-Perry, Takia S, FNP   1 tablet at 05/13/24 9076   OLANZapine  (ZYPREXA ) tablet 5 mg  5 mg Oral BID Wilkie Majel RAMAN, FNP   5 mg at 05/13/24 9076   Or   OLANZapine  (ZYPREXA ) injection 5 mg  5 mg Intramuscular BID Starkes-Perry, Takia S, FNP   5 mg at 05/09/24 2139   OLANZapine  (ZYPREXA ) injection 5 mg  5 mg Intramuscular TID PRN Starkes-Perry, Takia S, FNP       OLANZapine  zydis (ZYPREXA ) disintegrating tablet 5 mg  5 mg Oral TID PRN Starkes-Perry, Takia S, FNP       ondansetron  (ZOFRAN ) tablet 4 mg  4 mg Oral Q6H PRN Starkes-Perry, Majel RAMAN, FNP       Or   ondansetron  (ZOFRAN ) injection 4 mg  4 mg Intravenous Q6H PRN Starkes-Perry, Majel RAMAN, FNP       thiamine  (VITAMIN B1) tablet 100 mg  100 mg Oral Daily Starkes-Perry, Takia S, FNP   100 mg at 05/13/24 9076   traZODone  (DESYREL ) tablet 50 mg  50 mg Oral QHS Starkes-Perry, Takia S, FNP   50 mg at 05/12/24 2132   PTA Medications: Medications Prior to Admission  Medication Sig Dispense Refill Last Dose/Taking   feeding supplement  (ENSURE PLUS HIGH PROTEIN) LIQD Take 237 mLs by mouth 3 (three) times daily between meals.      Multiple Vitamin (MULTIVITAMIN WITH MINERALS) TABS tablet Take 1 tablet by mouth daily.      OLANZapine  (ZYPREXA ) 5 MG tablet Take 1 tablet (5 mg total) by mouth 2 (two) times daily.      OLANZapine  (ZYPREXA ) injection Inject 1 mL (5 mg total) into the muscle 2 (two) times daily.      thiamine  (VITAMIN B-1) 100 MG tablet Take 1 tablet (100 mg total) by mouth daily.      traZODone  (DESYREL ) 50 MG tablet Take 1 tablet (50 mg total) by mouth at bedtime.       Patient Stressors:    Patient Strengths:    Treatment Modalities: Medication Management, Group therapy, Case management,  1 to 1 session with clinician, Psychoeducation, Recreational therapy.   Physician Treatment Plan for Primary Diagnosis: <principal problem not specified> Long Term Goal(s): Improvement in symptoms so as ready for discharge   Short Term Goals: Ability to identify changes in lifestyle to reduce recurrence of condition will improve  Medication Management: Evaluate patient's response, side effects, and tolerance of medication regimen.  Therapeutic Interventions: 1 to 1 sessions, Unit Group sessions and Medication administration.  Evaluation of Outcomes: Progressing  Physician  Treatment Plan for Secondary Diagnosis: Active Problems:   Schizophrenia, paranoid (HCC)  Long Term Goal(s): Improvement in symptoms so as ready for discharge   Short Term Goals: Ability to identify changes in lifestyle to reduce recurrence of condition will improve     Medication Management: Evaluate patient's response, side effects, and tolerance of medication regimen.  Therapeutic Interventions: 1 to 1 sessions, Unit Group sessions and Medication administration.  Evaluation of Outcomes: Progressing   RN Treatment Plan for Primary Diagnosis: <principal problem not specified> Long Term Goal(s): Knowledge of disease and therapeutic regimen  to maintain health will improve  Short Term Goals: Ability to remain free from injury will improve, Ability to verbalize frustration and anger appropriately will improve, Ability to demonstrate self-control, Ability to participate in decision making will improve, Ability to verbalize feelings will improve, Ability to disclose and discuss suicidal ideas, Ability to identify and develop effective coping behaviors will improve, and Compliance with prescribed medications will improve  Medication Management: RN will administer medications as ordered by provider, will assess and evaluate patient's response and provide education to patient for prescribed medication. RN will report any adverse and/or side effects to prescribing provider.  Therapeutic Interventions: 1 on 1 counseling sessions, Psychoeducation, Medication administration, Evaluate responses to treatment, Monitor vital signs and CBGs as ordered, Perform/monitor CIWA, COWS, AIMS and Fall Risk screenings as ordered, Perform wound care treatments as ordered.  Evaluation of Outcomes: Progressing   LCSW Treatment Plan for Primary Diagnosis: <principal problem not specified> Long Term Goal(s): Safe transition to appropriate next level of care at discharge, Engage patient in therapeutic group addressing interpersonal concerns.  Short Term Goals: Engage patient in aftercare planning with referrals and resources, Increase social support, Increase ability to appropriately verbalize feelings, Increase emotional regulation, Facilitate acceptance of mental health diagnosis and concerns, Facilitate patient progression through stages of change regarding substance use diagnoses and concerns, Identify triggers associated with mental health/substance abuse issues, and Increase skills for wellness and recovery  Therapeutic Interventions: Assess for all discharge needs, 1 to 1 time with Social worker, Explore available resources and support systems, Assess for  adequacy in community support network, Educate family and significant other(s) on suicide prevention, Complete Psychosocial Assessment, Interpersonal group therapy.  Evaluation of Outcomes: Progressing   Progress in Treatment: Attending groups: No. Participating in groups: No. Taking medication as prescribed: Yes. Toleration medication: Yes. Family/Significant other contact made: No, will contact:  legal guardian Patient understands diagnosis: No. Discussing patient identified problems/goals with staff: Yes. Medical problems stabilized or resolved: Yes. Denies suicidal/homicidal ideation: Yes. Issues/concerns per patient self-inventory: No. Other:   New problem(s) identified: No, Describe:  None identified. Update: 05/13/2024 No changes at this time.   New Short Term/Long Term Goal(s): elimination of symptoms of psychosis, medication management for mood stabilization; elimination of SI thoughts; development of comprehensive mental wellness plan. Update: 05/13/2024 No changes at this time.   Patient Goals:  I just need to leave, I don't need it hereUpdate: 05/13/2024 No changes at this time.   Discharge Plan or Barriers: CSW will assist with appropriate discharge planning. Update: 05/13/2024 No changes at this time.   Reason for Continuation of Hospitalization: Aggression Hallucinations Medication stabilization   Estimated Length of Stay: 1 to 7 days. Update: 05/13/2024 TBD  Last 3 [place] Suicide Severity Risk Score: Flowsheet Row Admission (Current) from [facility]/06/20[facility] in [facility] ED to Hosp-Admission (Discharged) from 05/02/2024 in [facility] ED [facility]  C-SSRS RISK CATEGORY No Risk No Risk No Risk    Last PHQ 2/9 Scores:     No data to display          Scribe for Treatment Team: Aldo CHRISTELLA Niece, KEN 05/13/2024 3:56 PM

## 2024-05-14 LAB — LIPID PANEL
Cholesterol: 219 mg/dL — ABNORMAL HIGH (ref 0–200)
HDL: 83 mg/dL (ref >40)
LDL Cholesterol: 119 mg/dL — ABNORMAL HIGH (ref 0–99)
Total CHOL/HDL Ratio: 2.6 ratio
Triglycerides: 84 mg/dL (ref <150)
VLDL: 17 mg/dL (ref 0–40)

## 2024-05-14 LAB — HEMOGLOBIN A1C
Hgb A1c MFr Bld: 5.6 % (ref 4.8–5.6)
Mean Plasma Glucose: 114.02 mg/dL

## 2024-05-14 MED ORDER — OLANZAPINE 5 MG PO TABS
7.5000 mg | ORAL_TABLET | Freq: Two times a day (BID) | ORAL | Status: DC
  Administered 2024-05-14 – 2024-05-15 (×2): 7.5 mg via ORAL
  Filled 2024-05-14 (×3): qty 2

## 2024-05-14 MED ORDER — OLANZAPINE 10 MG IM SOLR: 7.5000 mg | Freq: Two times a day (BID) | INTRAMUSCULAR | Status: DC

## 2024-05-14 NOTE — Group Note (Signed)
 Date:  05/14/2024 Time:  10:27 AM  Group Topic/Focus:  Movement Therapy    Participation Level:  Did Not Attend    [name] Bias 05/14/2024, 10:27 AM

## 2024-05-14 NOTE — Plan of Care (Signed)
   Problem: Education: Goal: Knowledge of [facility] Education information/materials will improve Outcome: Progressing Goal: Emotional status will improve Outcome: Progressing Goal: Mental status will improve Outcome: Progressing Goal: Verbalization of understanding the information provided will improve Outcome: Progressing   Problem: Activity: Goal: Interest or engagement in activities will improve Outcome: Progressing Goal: Sleeping patterns will improve Outcome: Progressing   Problem: Coping: Goal: Ability to verbalize frustrations and anger appropriately will improve Outcome: Progressing Goal: Ability to demonstrate self-control will improve Outcome: Progressing   Problem: Health Behavior/Discharge Planning: Goal: Identification of resources available to assist in meeting health care needs will improve Outcome: Progressing Goal: Compliance with treatment plan for underlying cause of condition will improve Outcome: Progressing   Problem: Physical Regulation: Goal: Ability to maintain clinical measurements within normal limits will improve Outcome: Progressing   Problem: Safety: Goal: Periods of time without injury will increase Outcome: Progressing   Problem: Education: Goal: Knowledge of General Education information will improve Description: Including pain rating scale, medication(s)/side effects and non-pharmacologic comfort measures Outcome: Progressing   Problem: Health Behavior/Discharge Planning: Goal: Ability to manage health-related needs will improve Outcome: Progressing   Problem: Clinical Measurements: Goal: Ability to maintain clinical measurements within normal limits will improve Outcome: Progressing Goal: Will remain free from infection Outcome: Progressing Goal: Diagnostic test results will improve Outcome: Progressing Goal: Respiratory complications will improve Outcome: Progressing Goal: Cardiovascular complication will be  avoided Outcome: Progressing   Problem: Activity: Goal: Risk for activity intolerance will decrease Outcome: Progressing   Problem: Nutrition: Goal: Adequate nutrition will be maintained Outcome: Progressing   Problem: Coping: Goal: Level of anxiety will decrease Outcome: Progressing   Problem: Elimination: Goal: Will not experience complications related to bowel motility Outcome: Progressing Goal: Will not experience complications related to urinary retention Outcome: Progressing   Problem: Pain Managment: Goal: General experience of comfort will improve and/or be controlled Outcome: Progressing   Problem: Safety: Goal: Ability to remain free from injury will improve Outcome: Progressing   Problem: Skin Integrity: Goal: Risk for impaired skin integrity will decrease Outcome: Progressing

## 2024-05-14 NOTE — Group Note (Signed)
 LCSW Group Therapy Note  Group Date: 05/14/2024 Start Time: 1405 End Time: 1446   Type of Therapy and Topic:  Group Therapy - Healthy vs Unhealthy Coping Skills  Participation Level:  Did Not Attend   Description of Group The focus of this group was to determine what unhealthy coping techniques typically are used by group members and what healthy coping techniques would be helpful in coping with various problems. Patients were guided in becoming aware of the differences between healthy and unhealthy coping techniques. Patients were asked to identify 2-3 healthy coping skills they would like to learn to use more effectively.  Therapeutic Goals Patients learned that coping is what human beings do all day long to deal with various situations in their lives Patients defined and discussed healthy vs unhealthy coping techniques Patients identified their preferred coping techniques and identified whether these were healthy or unhealthy Patients determined 2-3 healthy coping skills they would like to become more familiar with and use more often. Patients provided support and ideas to each other   Summary of Patient Progress:  The patient did not attend group.   Therapeutic Modalities Cognitive Behavioral Therapy   Avon Molock S Estiben Mizuno, LCSWA 05/14/2024  3:26 PM

## 2024-05-14 NOTE — Progress Notes (Signed)
 [facility] MD Progress Note  05/14/2024 1:36 PM [name]  MRN:  [id number]  [name] is a 63 y.o. female admitted: Medicallyfor 05/02/2024 10:56 AM for dehydration, failure to thrive and hypoglycemia. She carries the psychiatric diagnoses of Schizophrenia and has medical hx of hypoglycemia and failure to thrive and has a past medical history of sinus bradycardia, chronic anemia, severe malnutrition and recurrent hypoglycemia.. Her current presentation of verbal aggression, agitation and refusal to eat with severe paranoia is most consistent with paranoid schizophrenia. [name]  She was at [facility] where she was admitted on 04/30/2024.  She was transferred to the ED because of failure to thrive and was admitted to medical floor.Patient is on IVC and patient has been adamantly refusing all oral medications and refusing to eat.  Patient is admitted to [facility] unit with Q15 min safety monitoring. Multidisciplinary team approach is offered. Medication management; group/milieu therapy is offered.    Subjective:  Chart reviewed, case discussed in multidisciplinary meeting, patient seen during rounds.    Patient is noted to be standing in her room.  She offers no complaints but remains minimally engaging in the interview and gets upset when mental health questions like SI/HI/plan or hallucinations or asked.  She is noted to be mumbling to herself and remains disorganized in her thought process per nursing patient is coming out of her room to have her meals and is taking her medications.  Patient denies any stiffness or pain in her body. Will reach out to DSS and patient's brother to discuss discharge planning once patient consistently remains stable  Sleep: Fair  Appetite:  Poor  Past Psychiatric History: see h&P Family History: History reviewed. No pertinent family history. Social History:  Social History   Substance and Sexual Activity  Alcohol Use Not Currently   Comment: Refuses to disclose how much      Social History   Substance and Sexual Activity  Drug Use No   Comment: Refuses to answer    Social History   Socioeconomic History   Marital status: Single    Spouse name: Not on file   Number of children: Not on file   Years of education: Not on file   Highest education level: Not on file  Occupational History   Not on file  Tobacco Use   Smoking status: Every Day    Current packs/day: 2.00    Types: Cigarettes    Passive exposure: Current   Smokeless tobacco: Never  Vaping Use   Vaping status: Never Used  Substance and Sexual Activity   Alcohol use: Not Currently    Comment: Refuses to disclose how much   Drug use: No    Comment: Refuses to answer   Sexual activity: Not Currently    Comment: refused to answer  Other Topics Concern   Not on file  Social History Narrative   Not on file   Social Drivers of Health   Financial Resource Strain: Not on file  Food Insecurity: Patient Declined (05/06/2024)   Hunger Vital Sign    Worried About Running Out of Food in the Last Year: Patient declined    Ran Out of Food in the Last Year: Patient declined  Transportation Needs: Patient Declined (05/06/2024)   PRAPARE - Administrator, Civil Service (Medical): Patient declined    Lack of Transportation (Non-Medical): Patient declined  Physical Activity: Not on file  Stress: Not on file  Social Connections: Not on file   Past Medical History:  Past Medical History:  Diagnosis Date   Anemia 10/02/2021   Non compliance w medication regimen    Schizophrenia [facility])     Past Surgical History:  Procedure Laterality Date   BIOPSY  09/25/2021   Procedure: BIOPSY;  Surgeon: Teressa Toribio SQUIBB, MD;  Location: [facility];  Service: Gastroenterology;;   ESOPHAGOGASTRODUODENOSCOPY (EGD) WITH PROPOFOL  [name] 09/25/2021   Procedure: ESOPHAGOGASTRODUODENOSCOPY (EGD) WITH PROPOFOL ;  Surgeon: Teressa Toribio SQUIBB, MD;  Location: [facility];  Service: Gastroenterology;  Laterality:  [name];  With NGT placement    Current Medications: Current Facility-Administered Medications  Medication Dose Route Frequency Provider Last Rate Last Admin   acetaminophen  (TYLENOL ) tablet 650 mg  650 mg Oral Q6H PRN Starkes-Perry, Takia S, FNP       alum & mag hydroxide-simeth (MAALOX/MYLANTA) 200-200-20 MG/5ML suspension 30 mL  30 mL Oral Q4H PRN Starkes-Perry, Takia S, FNP       HYDROcodone-acetaminophen  (NORCO/VICODIN) 5-325 MG per tablet 1-2 tablet  1-2 tablet Oral Q4H PRN Starkes-Perry, Takia S, FNP       magnesium  hydroxide (MILK OF MAGNESIA) suspension 30 mL  30 mL Oral Daily PRN Starkes-Perry, Takia S, FNP       multivitamin with minerals tablet 1 tablet  1 tablet Oral Daily Starkes-Perry, Takia S, FNP   1 tablet at 05/14/24 0908   OLANZapine  (ZYPREXA ) tablet 5 mg  5 mg Oral BID Starkes-Perry, Takia S, FNP   5 mg at 05/14/24 9091   Or   OLANZapine  (ZYPREXA ) injection 5 mg  5 mg Intramuscular BID Starkes-Perry, Takia S, FNP   5 mg at 05/09/24 2139   OLANZapine  (ZYPREXA ) injection 5 mg  5 mg Intramuscular TID PRN Starkes-Perry, Takia S, FNP       OLANZapine  zydis (ZYPREXA ) disintegrating tablet 5 mg  5 mg Oral TID PRN Starkes-Perry, Takia S, FNP       ondansetron  (ZOFRAN ) tablet 4 mg  4 mg Oral Q6H PRN Starkes-Perry, Majel RAMAN, FNP       Or   ondansetron  (ZOFRAN ) injection 4 mg  4 mg Intravenous Q6H PRN Starkes-Perry, Takia S, FNP       thiamine  (VITAMIN B1) tablet 100 mg  100 mg Oral Daily Wilkie Majel RAMAN, FNP   100 mg at 05/14/24 9090   traZODone  (DESYREL ) tablet 50 mg  50 mg Oral QHS Starkes-Perry, Takia S, FNP   50 mg at 05/13/24 2059    Lab Results:  Results for orders placed or performed during the hospital encounter of 05/06/24 (from the past 48 hours)  Lipid panel     Status: Abnormal   Collection Time: 05/14/24  8:51 AM  Result Value Ref Range   Cholesterol 219 (H) 0 - 200 mg/dL   Triglycerides 84 <849 mg/dL   HDL 83 >59 mg/dL   Total CHOL/HDL Ratio 2.6 RATIO   VLDL  17 0 - 40 mg/dL   LDL Cholesterol 880 (H) 0 - 99 mg/dL    Comment:        Total Cholesterol/HDL:CHD Risk Coronary Heart Disease Risk Table                     Men   Women  1/2 Average Risk   3.4   3.3  Average Risk       5.0   4.4  2 X Average Risk   9.6   7.1  3 X Average Risk  23.4   11.0        Use  the calculated Patient Ratio above and the CHD Risk Table to determine the patient's CHD Risk.        ATP III CLASSIFICATION (LDL):  <100     mg/dL   Optimal  899-870  mg/dL   Near or Above                    Optimal  130-159  mg/dL   Borderline  839-810  mg/dL   High  >809     mg/dL   Very High Performed at [facility], [address] [place], [place] [address]     Blood Alcohol level:  Lab Results  Component Value Date   [facility] <15 05/02/2024   ETH <15 05/01/2024    Metabolic Disorder Labs: Lab Results  Component Value Date   HGBA1C 5.7 (H) 05/02/2024   MPG 116.89 05/02/2024   MPG 114 10/21/2021   Lab Results  Component Value Date   PROLACTIN 13.6 04/16/2015   Lab Results  Component Value Date   CHOL 219 (H) 05/14/2024   TRIG 84 05/14/2024   HDL 83 05/14/2024   CHOLHDL 2.6 05/14/2024   VLDL 17 05/14/2024   LDLCALC 119 (H) 05/14/2024   LDLCALC 130 (H) 05/01/2024    Physical Findings: AIMS:  , ,  ,  ,    CIWA:    COWS:      Psychiatric Specialty Exam:  Presentation  General Appearance:  Casual  Eye Contact: Fair  Speech: Pressured  Speech Volume: Increased    Mood and Affect  Mood: Angry  Affect: Congruent   Thought Process  Thought Processes: Disorganized  Descriptions of Associations:Tangential  Orientation:Partial  Thought Content:Illogical; Tangential  Hallucinations:RESPONDING TO INTERNAL STIMULI Ideas of Reference:Paranoia; Delusions; Percusatory  Suicidal Thoughts:not endorsing Homicidal Thoughts:nor endorsing  Sensorium  Memory: Recent Poor  Judgment: Poor  Insight: Poor   Executive  Functions  Concentration: Poor  Attention Span: Poor  Recall: Poor  Fund of Knowledge: Poor  Language: Poor   Psychomotor Activity  Psychomotor Activity:No data recorded Musculoskeletal: Strength & Muscle Tone: within normal limits Gait & Station: normal Assets  Assets: Resilience    Physical Exam: Physical Exam Vitals and nursing note reviewed.    ROS Blood pressure 104/61, pulse (!) 52, temperature 97.8 F (36.6 C), resp. rate 17, height 5' 4 (1.626 m), weight 44.9 kg, SpO2 100%. Body mass index is 16.99 kg/m.  Diagnosis: Active Problems:   Schizophrenia, paranoid (HCC) Paranoid  PLAN: Safety and Monitoring:  -- Involuntary admission to inpatient psychiatric unit for safety, stabilization and treatment  -- Daily contact with patient to assess and evaluate symptoms and progress in treatment  -- Patient's case to be discussed in multi-disciplinary team meeting  -- Observation Level : q15 minute checks  -- Vital signs:  q12 hours  -- Precautions: suicide, elopement, and assault -- Encouraged patient to participate in unit milieu and in scheduled group therapies  2. Psychiatric Diagnoses and Treatment:  2 physicians approved non emergent forced medications-Dr.Madaram and Dr.Oisin Yoakum  Zyprexa  5 mg bID oral or Im -non emergent forced medication  . Discharge Planning:   -- Social work and case management to assist with discharge planning and identification of hospital follow-up needs prior to discharge  -- Estimated LOS: 3-4 days  Allyn Foil, MD 05/14/2024, 1:36 PM

## 2024-05-14 NOTE — Plan of Care (Signed)
   Problem: Education: Goal: Emotional status will improve Outcome: Not Progressing Goal: Mental status will improve Outcome: Not Progressing   Problem: Activity: Goal: Interest or engagement in activities will improve Outcome: Not Progressing

## 2024-05-14 NOTE — Group Note (Signed)
 Date:  05/14/2024 Time:  5:11 PM  Group Topic/Focus:    There is an intrinsic connection between music and nature, and when we tune in, we can find rhythms, melodies and inspiration all around us . [name] has inspired cultures and musicians for centuries, shaping everything from traditional folk songs to ppl corporation. We present a view that places our ability to create and appreciate music at the center of what it means to be human. We argue that music is the sounds of human bodies, voices and minds - our personalities - moving in creative, story-making ways.  Participation Level:  Did Not Attend   [name] Gavel 05/14/2024, 5:11 PM

## 2024-05-14 NOTE — Group Note (Signed)
 Date:  05/14/2024 Time:  9:10 PM  Group Topic/Focus:  Wrap-Up Group:   The focus of this group is to help patients review their daily goal of treatment and discuss progress on daily workbooks.    Participation Level:  Did Not Attend  Participation Quality:     Affect:     Cognitive:     Insight: None  Engagement in Group:  None  Modes of Intervention:     Additional Comments:    [name] 05/14/2024, 9:10 PM

## 2024-05-14 NOTE — Progress Notes (Signed)
 Behavior:  Cooperative with medications and VS.     Psych assessment:  Flat affect.  Denies SI.  Unable to ask any further assessment questions.   Interaction / Group attendance:  Isolates to room.  No interaction with peers.  Minimal interaction with staff.  Does not attend groups.   Medication/ PRNs: Compliant with PO medications.   Pain:  Denies  15 min checks in place for safety.

## 2024-05-15 MED ORDER — OLANZAPINE 10 MG IM SOLR
10.0000 mg | Freq: Two times a day (BID) | INTRAMUSCULAR | Status: DC
Start: 2024-05-15 — End: 2024-05-28
  Administered 2024-05-17: 10 mg via INTRAMUSCULAR
  Filled 2024-05-15: qty 10

## 2024-05-15 MED ORDER — OLANZAPINE 5 MG PO TABS
10.0000 mg | ORAL_TABLET | Freq: Two times a day (BID) | ORAL | Status: DC
  Administered 2024-05-15 – 2024-05-28 (×26): 10 mg via ORAL
  Filled 2024-05-15 (×26): qty 2

## 2024-05-15 NOTE — Group Note (Signed)
 Date:  05/15/2024 Time:  10:39 PM  Group Topic/Focus:  Wrap-Up Group:   The focus of this group is to help patients review their daily goal of treatment and discuss progress on daily workbooks.    Participation Level:  Did Not Attend  Participation Quality:     Affect:     Cognitive:     Insight: None  Engagement in Group:  None  Modes of Intervention:     Additional Comments:    [name] 05/15/2024, 10:39 PM

## 2024-05-15 NOTE — Group Note (Signed)
 Recreation Therapy Group Note   Group Topic:Coping Skills  Group Date: 05/15/2024 Start Time: 1500 End Time: 1540 Facilitators: Celestia Jeoffrey BRAVO, LRT, CTRS Location: Dayroom  Group Description: Seated Exercise. LRT discussed the mental and physical benefits of exercise. LRT and group discussed how physical activity can be used as a coping skill. Pt's and LRT followed along to an exercise video on the TV screen that provided a visual representation and audio description of every exercise performed. Pt's encouraged to listen to their bodies and stop at any time if they experience feelings of discomfort or pain. Pts were encouraged to drink water  and stay hydrated.   Goal Area(s) Addressed:  Patient will learn benefits of physical activity. Patient will identify exercise as a coping skill.  Patient will follow multistep directions. Patient will try a new leisure interest.    Affect/Mood: [name]   Participation Level: Did not attend    Clinical Observations/Individualized Feedback: Patient did not attend group.   Plan: Continue to engage patient in RT group sessions 2-3x/week.   Jeoffrey BRAVO Celestia, LRT, CTRS 05/15/2024 4:32 PM

## 2024-05-15 NOTE — Progress Notes (Signed)
 [facility] MD Progress Note  05/15/2024 3:30 PM Satcha Storlie  MRN:  [id number]  Louise Rawson is a 63 y.o. female admitted: Medicallyfor 05/02/2024 10:56 AM for dehydration, failure to thrive and hypoglycemia. She carries the psychiatric diagnoses of Schizophrenia and has medical hx of hypoglycemia and failure to thrive and has a past medical history of sinus bradycardia, chronic anemia, severe malnutrition and recurrent hypoglycemia.. Her current presentation of verbal aggression, agitation and refusal to eat with severe paranoia is most consistent with paranoid schizophrenia. [name]  She was at [facility] where she was admitted on 04/30/2024.  She was transferred to the ED because of failure to thrive and was admitted to medical floor.Patient is on IVC and patient has been adamantly refusing all oral medications and refusing to eat.  Patient is admitted to [facility] unit with Q15 min safety monitoring. Multidisciplinary team approach is offered. Medication management; group/milieu therapy is offered.    Subjective:  Chart reviewed, case discussed in multidisciplinary meeting, patient seen during rounds.    Patient is noted to be standing in her room.  She answered the questions related to her physical health including fair appetite and sleep, denies having any pain or stiffness in her muscles.  When asked about her family she became very irritable and refused to answer any further questions.  Provider tried to discuss her brother and guardianship.  She makes disorganized statements about government making things worse and is unable to further involving any meaningful conversation.  Patient is intermittently noted to be responding to stimuli.  She is visible on the unit during mealtime only and rest of the time she spends in her room. Sleep: Fair  Appetite:  Poor  Past Psychiatric History: see h&P Family History: History reviewed. No pertinent family history. Social History:  Social History   Substance and Sexual  Activity  Alcohol Use Not Currently   Comment: Refuses to disclose how much     Social History   Substance and Sexual Activity  Drug Use No   Comment: Refuses to answer    Social History   Socioeconomic History   Marital status: Single    Spouse name: Not on file   Number of children: Not on file   Years of education: Not on file   Highest education level: Not on file  Occupational History   Not on file  Tobacco Use   Smoking status: Every Day    Current packs/day: 2.00    Types: Cigarettes    Passive exposure: Current   Smokeless tobacco: Never  Vaping Use   Vaping status: Never Used  Substance and Sexual Activity   Alcohol use: Not Currently    Comment: Refuses to disclose how much   Drug use: No    Comment: Refuses to answer   Sexual activity: Not Currently    Comment: refused to answer  Other Topics Concern   Not on file  Social History Narrative   Not on file   Social Drivers of Health   Financial Resource Strain: Not on file  Food Insecurity: Patient Declined (05/06/2024)   Hunger Vital Sign    Worried About Running Out of Food in the Last Year: Patient declined    Ran Out of Food in the Last Year: Patient declined  Transportation Needs: Patient Declined (05/06/2024)   PRAPARE - Administrator, Civil Service (Medical): Patient declined    Lack of Transportation (Non-Medical): Patient declined  Physical Activity: Not on file  Stress: Not on  file  Social Connections: Not on file   Past Medical History:  Past Medical History:  Diagnosis Date   Anemia 10/02/2021   Non compliance w medication regimen    Schizophrenia [facility])     Past Surgical History:  Procedure Laterality Date   BIOPSY  09/25/2021   Procedure: BIOPSY;  Surgeon: Teressa Toribio SQUIBB, MD;  Location: [facility];  Service: Gastroenterology;;   ESOPHAGOGASTRODUODENOSCOPY (EGD) WITH PROPOFOL  [name] 09/25/2021   Procedure: ESOPHAGOGASTRODUODENOSCOPY (EGD) WITH PROPOFOL ;  Surgeon: Teressa Toribio SQUIBB, MD;  Location: [facility];  Service: Gastroenterology;  Laterality: [name];  With NGT placement    Current Medications: Current Facility-Administered Medications  Medication Dose Route Frequency Provider Last Rate Last Admin   acetaminophen  (TYLENOL ) tablet 650 mg  650 mg Oral Q6H PRN Starkes-Perry, Takia S, FNP       alum & mag hydroxide-simeth (MAALOX/MYLANTA) 200-200-20 MG/5ML suspension 30 mL  30 mL Oral Q4H PRN Starkes-Perry, Takia S, FNP       HYDROcodone-acetaminophen  (NORCO/VICODIN) 5-325 MG per tablet 1-2 tablet  1-2 tablet Oral Q4H PRN Starkes-Perry, Takia S, FNP       magnesium  hydroxide (MILK OF MAGNESIA) suspension 30 mL  30 mL Oral Daily PRN Starkes-Perry, Takia S, FNP       multivitamin with minerals tablet 1 tablet  1 tablet Oral Daily Starkes-Perry, Takia S, FNP   1 tablet at 05/15/24 9071   OLANZapine  (ZYPREXA ) injection 5 mg  5 mg Intramuscular TID PRN Starkes-Perry, Takia S, FNP       OLANZapine  (ZYPREXA ) tablet 7.5 mg  7.5 mg Oral BID Yusef Lamp, MD   7.5 mg at 05/15/24 9071   Or   OLANZapine  (ZYPREXA ) injection 7.5 mg  7.5 mg Intramuscular BID Kalee Mcclenathan, MD       OLANZapine  zydis (ZYPREXA ) disintegrating tablet 5 mg  5 mg Oral TID PRN Starkes-Perry, Takia S, FNP       ondansetron  (ZOFRAN ) tablet 4 mg  4 mg Oral Q6H PRN Starkes-Perry, Majel RAMAN, FNP       Or   ondansetron  (ZOFRAN ) injection 4 mg  4 mg Intravenous Q6H PRN Starkes-Perry, Takia S, FNP       thiamine  (VITAMIN B1) tablet 100 mg  100 mg Oral Daily Wilkie Majel RAMAN, FNP   100 mg at 05/15/24 9071   traZODone  (DESYREL ) tablet 50 mg  50 mg Oral QHS Starkes-Perry, Takia S, FNP   50 mg at 05/14/24 2134    Lab Results:  Results for orders placed or performed during the hospital encounter of 05/06/24 (from the past 48 hours)  Lipid panel     Status: Abnormal   Collection Time: 05/14/24  8:51 AM  Result Value Ref Range   Cholesterol 219 (H) 0 - 200 mg/dL   Triglycerides 84 <849 mg/dL   HDL  83 >59 mg/dL   Total CHOL/HDL Ratio 2.6 RATIO   VLDL 17 0 - 40 mg/dL   LDL Cholesterol 880 (H) 0 - 99 mg/dL    Comment:        Total Cholesterol/HDL:CHD Risk Coronary Heart Disease Risk Table                     Men   Women  1/2 Average Risk   3.4   3.3  Average Risk       5.0   4.4  2 X Average Risk   9.6   7.1  3 X Average Risk  23.4   11.0  Use the calculated Patient Ratio above and the CHD Risk Table to determine the patient's CHD Risk.        ATP III CLASSIFICATION (LDL):  <100     mg/dL   Optimal  899-870  mg/dL   Near or Above                    Optimal  130-159  mg/dL   Borderline  839-810  mg/dL   High  >809     mg/dL   Very High Performed at [facility], [address] [place], [place] [address]   Hemoglobin A1c     Status: None   Collection Time: 05/14/24  8:51 AM  Result Value Ref Range   Hgb A1c MFr Bld 5.6 4.8 - 5.6 %    Comment: (NOTE) Diagnosis of Diabetes The following HbA1c ranges recommended by the American Diabetes Association (ADA) may be used as an aid in the diagnosis of diabetes mellitus.  Hemoglobin             Suggested A1C NGSP%              Diagnosis  <5.7                   Non Diabetic  5.7-6.4                Pre-Diabetic  >6.4                   Diabetic  <7.0                   Glycemic control for                       adults with diabetes.     Mean Plasma Glucose 114.02 mg/dL    Comment: Performed at [facility], [address], [place], [place] [address]    Blood Alcohol level:  Lab Results  Component Value Date   [facility] <15 05/02/2024   ETH <15 05/01/2024    Metabolic Disorder Labs: Lab Results  Component Value Date   HGBA1C 5.6 05/14/2024   MPG 114.02 05/14/2024   MPG 116.89 05/02/2024   Lab Results  Component Value Date   PROLACTIN 13.6 04/16/2015   Lab Results  Component Value Date   CHOL 219 (H) 05/14/2024   TRIG 84 05/14/2024   HDL 83 05/14/2024   CHOLHDL 2.6 05/14/2024   VLDL 17  05/14/2024   LDLCALC 119 (H) 05/14/2024   LDLCALC 130 (H) 05/01/2024    Physical Findings: AIMS:  , ,  ,  ,    CIWA:    COWS:      Psychiatric Specialty Exam:  Presentation  General Appearance:  Casual  Eye Contact: Fair  Speech: Pressured  Speech Volume: Increased    Mood and Affect  Mood: Angry  Affect: Congruent   Thought Process  Thought Processes: Disorganized  Descriptions of Associations:Tangential  Orientation:Partial  Thought Content:Illogical; Tangential  Hallucinations:RESPONDING TO INTERNAL STIMULI Ideas of Reference:Paranoia; Delusions; Percusatory  Suicidal Thoughts:not endorsing Homicidal Thoughts:nor endorsing  Sensorium  Memory: Recent Poor  Judgment: Poor  Insight: Poor   Executive Functions  Concentration: Poor  Attention Span: Poor  Recall: Poor  Fund of Knowledge: Poor  Language: Poor   Psychomotor Activity  Psychomotor Activity:No data recorded Musculoskeletal: Strength & Muscle Tone: within normal limits Gait & Station: normal Assets  Assets: Resilience  Physical Exam: Physical Exam Vitals and nursing note reviewed.    ROS Blood pressure (!) 100/53, pulse (!) 52, temperature (!) 97.5 F (36.4 C), resp. rate 15, height 5' 4 (1.626 m), weight 44.9 kg, SpO2 100%. Body mass index is 16.99 kg/m.  Diagnosis: Active Problems:   Schizophrenia, paranoid (HCC) Paranoid  PLAN: Safety and Monitoring:  -- Involuntary admission to inpatient psychiatric unit for safety, stabilization and treatment  -- Daily contact with patient to assess and evaluate symptoms and progress in treatment  -- Patient's case to be discussed in multi-disciplinary team meeting  -- Observation Level : q15 minute checks  -- Vital signs:  q12 hours  -- Precautions: suicide, elopement, and assault -- Encouraged patient to participate in unit milieu and in scheduled group therapies  2. Psychiatric Diagnoses and Treatment:   2 physicians approved non emergent forced medications-Dr.Madaram and Dr.Annice Jolly  Zyprexa  5 mg bID oral or Im -non emergent forced medication  . Discharge Planning:   -- Social work and case management to assist with discharge planning and identification of hospital follow-up needs prior to discharge  -- Estimated LOS: 3-4 days  Allyn Foil, MD 05/15/2024, 3:30 PM

## 2024-05-15 NOTE — Group Note (Signed)
 Date:  05/15/2024 Time:  5:21 PM  Group Topic/Focus:  Self Care:   The focus of this group is to help patients understand the importance of self-care in order to improve or restore emotional, physical, spiritual, interpersonal, and financial health.    Participation Level:  None  Participation Quality:  Resistant  Affect:  Resistant  Cognitive:  Lacking  Insight: None  Engagement in Group:  Resistant  Modes of Intervention:  Discussion  Additional Comments:    [name] 05/15/2024, 5:21 PM

## 2024-05-15 NOTE — Plan of Care (Signed)
  Problem: Education: Goal: Knowledge of  General Education information/materials will improve 05/15/2024 0642 by Jannifer Tonna LABOR, RN Outcome: Not Progressing 05/15/2024 0642 by Jannifer Tonna LABOR, RN Outcome: Progressing Goal: Emotional status will improve 05/15/2024 0642 by Jannifer Tonna LABOR, RN Outcome: Not Progressing 05/15/2024 0642 by Jannifer Tonna LABOR, RN Outcome: Progressing Goal: Mental status will improve 05/15/2024 0642 by Jannifer Tonna LABOR, RN Outcome: Not Progressing 05/15/2024 0642 by Jannifer Tonna LABOR, RN Outcome: Progressing Goal: Verbalization of understanding the information provided will improve 05/15/2024 0642 by Jannifer Tonna LABOR, RN Outcome: Not Progressing 05/15/2024 0642 by Jannifer Tonna LABOR, RN Outcome: Progressing

## 2024-05-15 NOTE — Progress Notes (Signed)
   05/15/24 0600  Psych Admission Type (Psych Patients Only)  Admission Status Involuntary  Psychosocial Assessment  Patient Complaints Isolation  Eye Contact Fair  Facial Expression Flat  Affect Flat  Speech Soft  Interaction Minimal  Motor Activity Slow  Appearance/Hygiene In scrubs;Unremarkable  Behavior Characteristics Cooperative  Mood Sad  Aggressive Behavior  Effect No apparent injury  Thought Process  Coherency Disorganized  Content Preoccupation  Delusions UTA  Perception Hallucinations  Hallucination Auditory  Judgment Impaired  Confusion Mild  Danger to Self  Current suicidal ideation? Denies  Agreement Not to Harm Self Yes  Danger to Others  Danger to Others None reported or observed

## 2024-05-15 NOTE — Progress Notes (Signed)
 SI/HI/AVH: +AH  Behavior/Mood: appropriate/preoccupied     Interaction/Group attendance: minimal and isolative/0 out of 4 groups   Medication/PRNs: compliant/no PRN's   Pain: 0/10  Other: discharge planning ongoing

## 2024-05-15 NOTE — Group Note (Signed)
 Physical/Occupational Therapy Group Note  Group Topic: Transfer Training   Group Date: 05/15/2024 Start Time: 1300 End Time: 1325 Facilitators: Hamad Whyte, Alm Hamilton, PT   Group Description: Group educated on sequence and techniques to maximize safety with functional transfers.  Additionally, integrated education on impact of seating surfaces, use of assistive device and management of orthostasis with movement transitions.  Patients actively engaged with functional transfers (sit/stand) from various seating surfaces, with and without assist devices, working to integrate and retain education provided during session.  Allowed time for questions and further discussion on mobility concerns/needs.   Therapeutic Goal(s):  Identify and demonstrate safe technique for sit/stand transfers from various seating surfaces. Identify and demonstrate safe use of assistive devices with basic transfers and simple mobility. Identify and demonstrate ability to recognize signs/symptoms of orthostasis and appropriate compensatory/safety techniques.  Individual Participation: Did not attend  Participation Level:   Participation Quality:   Behavior:   Speech/Thought Process:   Affect/Mood:   Insight:   Judgement:   Individualization:   Modes of Intervention:   Patient Response to Interventions:    Plan: Continue to engage patient in PT/OT groups 1 - 2x/week.  CHARM Hamilton Bertin PT, DPT 05/15/24, 4:49 PM

## 2024-05-15 NOTE — Group Note (Signed)
 Date:  05/15/2024 Time:  10:44 AM  Group Topic/Focus:  Goals Group:   The focus of this group is to help patients establish daily goals to achieve during treatment and discuss how the patient can incorporate goal setting into their daily lives to aide in recovery.    Participation Level:  Did Not Attend  Participation Quality:     Affect:     Cognitive:     Insight: None  Engagement in Group:    Modes of Intervention:     Additional Comments:    Gillian Meeuwsen K 05/15/2024, 10:44 AM

## 2024-05-15 NOTE — Plan of Care (Signed)
  Problem: Education: Goal: Mental status will improve Outcome: Not Progressing   Problem: Activity: Goal: Interest or engagement in activities will improve Outcome: Not Progressing

## 2024-05-16 NOTE — Group Note (Signed)
 Date:  05/16/2024 Time:  9:06 PM  Group Topic/Focus:  Movie Group The purpose of this group is for patients to come together and watch a soothing movie of their liking while having snack with their peers    Participation Level:  Did Not Attend  Beatris ONEIDA Hasten 05/16/2024, 9:06 PM

## 2024-05-16 NOTE — Progress Notes (Signed)
   05/15/24 2200  Psych Admission Type (Psych Patients Only)  Admission Status Involuntary  Psychosocial Assessment  Patient Complaints Sadness  Eye Contact Brief  Facial Expression Sad  Affect Sad  Speech Soft  Interaction Guarded;Minimal  Motor Activity Pacing  Appearance/Hygiene In scrubs  Behavior Characteristics Guarded  Mood Preoccupied  Thought Process  Coherency Concrete thinking  Content Preoccupation  Delusions UTA  Perception Hallucinations  Hallucination Auditory  Judgment Impaired  Confusion Mild  Danger to Self  Current suicidal ideation? Denies  Self-Injurious Behavior No self-injurious ideation or behavior indicators observed or expressed   Agreement Not to Harm Self Yes  Description of Agreement Verbal  Danger to Others  Danger to Others None reported or observed

## 2024-05-16 NOTE — Group Note (Signed)
 Date:  05/16/2024 Time:  10:25 AM  Group Topic/Focus:  Emotional Education:   The focus of this group is to discuss what feelings/emotions are, and how they are experienced.  Emotional regulation is the ability to manage your emotions, not suppress them, by recognizing, understanding, and responding to them in healthy ways. It involves influencing which emotions you have, when you have them, and how you experience and express them to achieve a desired outcome, rather than simply reacting impulsively. For example, taking a deep breath when frustrated instead of throwing things is a form of emotional regulation.   Participation Level:  Did Not Attend   [name] 05/16/2024, 10:25 AM

## 2024-05-16 NOTE — Group Note (Signed)
 Recreation Therapy Group Note   Group Topic:Coping Skills  Group Date: 05/16/2024 Start Time: 1100 End Time: 1130 Facilitators: Celestia Jeoffrey BRAVO, LRT, CTRS Location: Dayroom  Group Description: Music. Patients are encouraged to name their favorite song(s) for LRT to play song through speaker for group to hear. Patients educated on the definition of leisure and the importance of having different leisure interests outside of the hospital. Group discussed how leisure activities can often be used as pharmacologist and that listening to music and being outside are examples.    Goal Area(s) Addressed:  Patient will identify a current leisure interest.  Patient will practice making a positive decision. Patient will have the opportunity to try a new leisure activity.   Affect/Mood: [name]   Participation Level: Did not attend    Clinical Observations/Individualized Feedback: Patient did not attend group.   Plan: Continue to engage patient in RT group sessions 2-3x/week.   Jeoffrey BRAVO Celestia, LRT, CTRS 05/16/2024 1:29 PM

## 2024-05-16 NOTE — Progress Notes (Signed)
   05/16/24 1100  Psych Admission Type (Psych Patients Only)  Admission Status Involuntary  Psychosocial Assessment  Patient Complaints Isolation  Eye Contact Brief  Facial Expression Sad  Affect Sad  Speech Logical/coherent  Interaction Guarded  Motor Activity Pacing  Appearance/Hygiene In scrubs  Behavior Characteristics Guarded  Mood Preoccupied  Thought Process  Coherency Concrete thinking  Content Preoccupation  Delusions None reported or observed  Perception Hallucinations  Hallucination Auditory  Judgment Impaired  Confusion Mild  Danger to Self  Current suicidal ideation? Denies  Self-Injurious Behavior No self-injurious ideation or behavior indicators observed or expressed   Agreement Not to Harm Self Yes  Description of Agreement verbal  Danger to Others  Danger to Others None reported or observed

## 2024-05-16 NOTE — Progress Notes (Signed)
 [facility] MD Progress Note  05/16/2024 11:41 AM [name]  MRN:  [id number]  [name] is a 63 y.o. female admitted: Medicallyfor 05/02/2024 10:56 AM for dehydration, failure to thrive and hypoglycemia. She carries the psychiatric diagnoses of Schizophrenia and has medical hx of hypoglycemia and failure to thrive and has a past medical history of sinus bradycardia, chronic anemia, severe malnutrition and recurrent hypoglycemia.. Her current presentation of verbal aggression, agitation and refusal to eat with severe paranoia is most consistent with paranoid schizophrenia. [name]  She was at [facility] where she was admitted on 04/30/2024.  She was transferred to the ED because of failure to thrive and was admitted to medical floor.Patient is on IVC and patient has been adamantly refusing all oral medications and refusing to eat.  Patient is admitted to [facility] unit with Q15 min safety monitoring. Multidisciplinary team approach is offered. Medication management; group/milieu therapy is offered.    Subjective:  Chart reviewed, case discussed in multidisciplinary meeting, patient seen during rounds.   Patient is noted to be standing in her room.  She continues pacing in the room and remains irritable towards the provider.  Per nursing she does come out of her room and is attending to her ADLs and eating food.  She is taking her medications with no reported side effects.  She gets very irritable when questions related to her mental health are asked.  She refuses to answer questions regarding hallucinations, depression, SI/HI.  She demands to be discharged and when asked about the address that she will be going to she gets upset and states that nobody needs to now and they will come to pick her up.  Sleep: Fair  Appetite:  Poor  Past Psychiatric History: see h&P Family History: History reviewed. No pertinent family history. Social History:  Social History   Substance and Sexual Activity  Alcohol Use Not  Currently   Comment: Refuses to disclose how much     Social History   Substance and Sexual Activity  Drug Use No   Comment: Refuses to answer    Social History   Socioeconomic History   Marital status: Single    Spouse name: Not on file   Number of children: Not on file   Years of education: Not on file   Highest education level: Not on file  Occupational History   Not on file  Tobacco Use   Smoking status: Every Day    Current packs/day: 2.00    Types: Cigarettes    Passive exposure: Current   Smokeless tobacco: Never  Vaping Use   Vaping status: Never Used  Substance and Sexual Activity   Alcohol use: Not Currently    Comment: Refuses to disclose how much   Drug use: No    Comment: Refuses to answer   Sexual activity: Not Currently    Comment: refused to answer  Other Topics Concern   Not on file  Social History Narrative   Not on file   Social Drivers of Health   Financial Resource Strain: Not on file  Food Insecurity: Patient Declined (05/06/2024)   Hunger Vital Sign    Worried About Running Out of Food in the Last Year: Patient declined    Ran Out of Food in the Last Year: Patient declined  Transportation Needs: Patient Declined (05/06/2024)   PRAPARE - Administrator, Civil Service (Medical): Patient declined    Lack of Transportation (Non-Medical): Patient declined  Physical Activity: Not on file  Stress: Not on file  Social Connections: Not on file   Past Medical History:  Past Medical History:  Diagnosis Date   Anemia 10/02/2021   Non compliance w medication regimen    Schizophrenia [facility])     Past Surgical History:  Procedure Laterality Date   BIOPSY  09/25/2021   Procedure: BIOPSY;  Surgeon: Teressa Toribio SQUIBB, MD;  Location: [facility];  Service: Gastroenterology;;   ESOPHAGOGASTRODUODENOSCOPY (EGD) WITH PROPOFOL  [name] 09/25/2021   Procedure: ESOPHAGOGASTRODUODENOSCOPY (EGD) WITH PROPOFOL ;  Surgeon: Teressa Toribio SQUIBB, MD;  Location: [facility];  Service: Gastroenterology;  Laterality: [name];  With NGT placement    Current Medications: Current Facility-Administered Medications  Medication Dose Route Frequency Provider Last Rate Last Admin   acetaminophen  (TYLENOL ) tablet 650 mg  650 mg Oral Q6H PRN Starkes-Perry, Takia S, FNP       alum & mag hydroxide-simeth (MAALOX/MYLANTA) 200-200-20 MG/5ML suspension 30 mL  30 mL Oral Q4H PRN Starkes-Perry, Takia S, FNP       HYDROcodone-acetaminophen  (NORCO/VICODIN) 5-325 MG per tablet 1-2 tablet  1-2 tablet Oral Q4H PRN Starkes-Perry, Takia S, FNP       magnesium  hydroxide (MILK OF MAGNESIA) suspension 30 mL  30 mL Oral Daily PRN Starkes-Perry, Takia S, FNP       multivitamin with minerals tablet 1 tablet  1 tablet Oral Daily Starkes-Perry, Takia S, FNP   1 tablet at 05/16/24 1004   OLANZapine  (ZYPREXA ) tablet 10 mg  10 mg Oral BID Anika Shore, MD   10 mg at 05/16/24 1004   Or   OLANZapine  (ZYPREXA ) injection 10 mg  10 mg Intramuscular BID Ziare Orrick, MD       OLANZapine  (ZYPREXA ) injection 5 mg  5 mg Intramuscular TID PRN Starkes-Perry, Majel RAMAN, FNP       OLANZapine  zydis (ZYPREXA ) disintegrating tablet 5 mg  5 mg Oral TID PRN Starkes-Perry, Takia S, FNP       ondansetron  (ZOFRAN ) tablet 4 mg  4 mg Oral Q6H PRN Starkes-Perry, Majel RAMAN, FNP       Or   ondansetron  (ZOFRAN ) injection 4 mg  4 mg Intravenous Q6H PRN Starkes-Perry, Majel RAMAN, FNP       thiamine  (VITAMIN B1) tablet 100 mg  100 mg Oral Daily Wilkie Majel RAMAN, FNP   100 mg at 05/16/24 1004   traZODone  (DESYREL ) tablet 50 mg  50 mg Oral QHS Starkes-Perry, Takia S, FNP   50 mg at 05/15/24 2132    Lab Results:  No results found for this or any previous visit (from the past 48 hours).   Blood Alcohol level:  Lab Results  Component Value Date   [facility] <15 05/02/2024   ETH <15 05/01/2024    Metabolic Disorder Labs: Lab Results  Component Value Date   HGBA1C 5.6 05/14/2024   MPG 114.02 05/14/2024   MPG 116.89  05/02/2024   Lab Results  Component Value Date   PROLACTIN 13.6 04/16/2015   Lab Results  Component Value Date   CHOL 219 (H) 05/14/2024   TRIG 84 05/14/2024   HDL 83 05/14/2024   CHOLHDL 2.6 05/14/2024   VLDL 17 05/14/2024   LDLCALC 119 (H) 05/14/2024   LDLCALC 130 (H) 05/01/2024    Physical Findings: AIMS:  , ,  ,  ,    CIWA:    COWS:      Psychiatric Specialty Exam:  Presentation  General Appearance:  Casual  Eye Contact: Fair  Speech: Pressured  Speech Volume: Increased  Mood and Affect  Mood: Angry  Affect: Congruent   Thought Process  Thought Processes: Disorganized  Descriptions of Associations:Tangential  Orientation:Partial  Thought Content:Illogical; Tangential  Hallucinations:RESPONDING TO INTERNAL STIMULI Ideas of Reference:Paranoia; Delusions; Percusatory  Suicidal Thoughts:not endorsing Homicidal Thoughts:nor endorsing  Sensorium  Memory: Recent Poor  Judgment: Poor  Insight: Poor   Executive Functions  Concentration: Poor  Attention Span: Poor  Recall: Poor  Fund of Knowledge: Poor  Language: Poor   Psychomotor Activity  Psychomotor Activity:No data recorded Musculoskeletal: Strength & Muscle Tone: within normal limits Gait & Station: normal Assets  Assets: Resilience    Physical Exam: Physical Exam Vitals and nursing note reviewed.    ROS Blood pressure (!) 101/58, pulse (!) 55, temperature 97.7 F (36.5 C), resp. rate 18, height 5' 4 (1.626 m), weight 44.9 kg, SpO2 98%. Body mass index is 16.99 kg/m.  Diagnosis: Active Problems:   Schizophrenia, paranoid (HCC) Paranoid  PLAN: Safety and Monitoring:  -- Involuntary admission to inpatient psychiatric unit for safety, stabilization and treatment  -- Daily contact with patient to assess and evaluate symptoms and progress in treatment  -- Patient's case to be discussed in multi-disciplinary team meeting  -- Observation Level : q15  minute checks  -- Vital signs:  q12 hours  -- Precautions: suicide, elopement, and assault -- Encouraged patient to participate in unit milieu and in scheduled group therapies  2. Psychiatric Diagnoses and Treatment: Patient currently has no appointed legal guardian but her brother is trying to get the guardianship.  Will reach out to APS to see whom to approach to get consent on additional medications. 2 physicians approved non emergent forced medications-Dr.Madaram and Dr.Eleno Weimar  Zyprexa  7.5 mg bID oral or Im -non emergent forced medication Will add second antipsychotic to help with the residual psychotic symptoms like Prolixin  . Discharge Planning:   -- Social work and case management to assist with discharge planning and identification of hospital follow-up needs prior to discharge  -- Estimated LOS: 3-4 days  Timiyah Romito, MD 05/16/2024, 11:41 AM

## 2024-05-16 NOTE — Plan of Care (Signed)
  Problem: Activity: Goal: Interest or engagement in activities will improve 05/16/2024 1711 by Autumn Deland KIDD, RN Outcome: Progressing 05/16/2024 1711 by Autumn Deland KIDD, RN Outcome: Progressing Goal: Sleeping patterns will improve 05/16/2024 1711 by Autumn Deland KIDD, RN Outcome: Progressing 05/16/2024 1711 by Autumn Deland KIDD, RN Outcome: Progressing

## 2024-05-16 NOTE — Group Note (Deleted)
 Recreation Therapy Group Note   Group Topic:Coping Skills  Group Date: 05/16/2024 Start Time: 1100 End Time: 1130 Facilitators: Celestia Jeoffrey BRAVO, LRT Location: Dayroom  Group Description: Music. Patients are encouraged to name their favorite song(s) for LRT to play song through speaker for group to hear, while in the courtyard getting fresh air and sunlight. Patients educated on the definition of leisure and the importance of having different leisure interests outside of the hospital. Group discussed how leisure activities can often be used as pharmacologist and that listening to music and being outside are examples.    Goal Area(s) Addressed:  Patient will identify a current leisure interest.  Patient will practice making a positive decision. Patient will have the opportunity to try a new leisure activity.       Affect/Mood: {RT BHH Affect/Mood:26271}   Participation Level: {RT BHH Participation Level:26267}   Participation Quality: {RT BHH Participation Quality:26268}   Behavior: {RT BHH Group Behavior:26269}   Speech/Thought Process: {RT BHH Speech/Thought:26276}   Insight: {RT BHH Insight:26272}   Judgement: {RT BHH Judgement:26278}   Modes of Intervention: {RT BHH Modes of Intervention:26277}   Patient Response to Interventions:  {RT BHH Patient Response to Intervention:26274}   Education Outcome:  {RT BHH Education Outcome:26279}   Clinical Observations/Individualized Feedback: *** was *** in their participation of session activities and group discussion. Pt identified ***   Plan: {RT BHH Tx Eojw:73719}   Jeoffrey BRAVO Celestia, LRT,  05/16/2024 1:28 PM

## 2024-05-16 NOTE — Group Note (Signed)
 LCSW Group Therapy Note  Group Date: 05/16/2024 Start Time: 1315 End Time: 1400   Type of Therapy and Topic:  Group Therapy - Healthy vs Unhealthy Coping Skills  Participation Level:  Did Not Attend   Description of Group The focus of this group was to determine what unhealthy coping techniques typically are used by group members and what healthy coping techniques would be helpful in coping with various problems. Patients were guided in becoming aware of the differences between healthy and unhealthy coping techniques. Patients were asked to identify 2-3 healthy coping skills they would like to learn to use more effectively.  Therapeutic Goals Patients learned that coping is what human beings do all day long to deal with various situations in their lives Patients defined and discussed healthy vs unhealthy coping techniques Patients identified their preferred coping techniques and identified whether these were healthy or unhealthy Patients determined 2-3 healthy coping skills they would like to become more familiar with and use more often. Patients provided support and ideas to each other   Summary of Patient Progress:  X   Therapeutic Modalities Cognitive Behavioral Therapy Motivational Interviewing  [name], [place] 05/16/2024  2:20 PM

## 2024-05-16 NOTE — Plan of Care (Signed)
   Problem: Education: Goal: Emotional status will improve Outcome: Not Progressing Goal: Mental status will improve Outcome: Not Progressing

## 2024-05-16 NOTE — Group Note (Signed)
 Recreation Therapy Group Note   Group Topic:Emotion Expression  Group Date: 05/16/2024 Start Time: 1500 End Time: 1600 Facilitators: Celestia Jeoffrey BRAVO, LRT, CTRS Location: Dayroom  Group Description: Painting a Diplomatic Services Operational Officer. Patients and LRT discuss what it means to be "at peace", what it feels like physically and mentally. Pts are given a canvas and watercolor paint to use and encouraged to draw their idea of a peaceful place. Pts and LRT discuss how they use this in their daily life post discharge. Pts are encouraged to take their canvas home with them as a reminder to find their peaceful place whenever they are feeling depressed, anxious, etc.    Goal Area(s) Addressed:  Patient will identify what it means to experience a "peaceful" emotion. Patient will identify a new coping skill.  Patient will express their emotions through art. Patients will increase communication by talking with LRT and peers while in group.  Affect/Mood: [name]   Participation Level: Did not attend    Clinical Observations/Individualized Feedback: Patient did not attend group.   Plan: Continue to engage patient in RT group sessions 2-3x/week.   Jeoffrey BRAVO Celestia, LRT, CTRS 05/16/2024 4:40 PM

## 2024-05-17 NOTE — Progress Notes (Signed)
 Patient denies SI,HI & AVH.  Patient remains isolative to his room. She was compliant with medications on the shift.

## 2024-05-17 NOTE — Progress Notes (Signed)
   05/17/24 2200  Psych Admission Type (Psych Patients Only)  Admission Status Involuntary  Psychosocial Assessment  Patient Complaints Isolation  Eye Contact Brief  Facial Expression Flat  Affect Flat  Speech Logical/coherent  Interaction Isolative  Motor Activity Pacing  Appearance/Hygiene In scrubs  Behavior Characteristics Guarded  Mood Preoccupied  Thought Process  Coherency Concrete thinking  Content Preoccupation  Delusions None reported or observed  Perception Hallucinations  Hallucination Auditory  Judgment Impaired  Confusion Mild  Danger to Self  Current suicidal ideation? Denies  Self-Injurious Behavior No self-injurious ideation or behavior indicators observed or expressed   Agreement Not to Harm Self Yes  Description of Agreement Verbal  Danger to Others  Danger to Others None reported or observed

## 2024-05-17 NOTE — Plan of Care (Signed)
  Problem: Activity: Goal: Sleeping patterns will improve Outcome: Progressing   

## 2024-05-17 NOTE — Group Note (Signed)
 Date:  05/17/2024 Time:  9:02 PM  Group Topic/Focus:  Wrap-Up Group:   The focus of this group is to help patients review their daily goal of treatment and discuss progress on daily workbooks.    Participation Level:  Did Not Attend  Additional Comments:    Kristen VEAR Gibbon 05/17/2024, 9:02 PM

## 2024-05-17 NOTE — Plan of Care (Signed)
   Problem: Education: Goal: Knowledge of [facility] Education information/materials will improve Outcome: Progressing Goal: Emotional status will improve Outcome: Progressing Goal: Mental status will improve Outcome: Progressing Goal: Verbalization of understanding the information provided will improve Outcome: Progressing

## 2024-05-17 NOTE — Progress Notes (Signed)
   05/17/24 1100  Psych Admission Type (Psych Patients Only)  Admission Status Involuntary  Psychosocial Assessment  Patient Complaints Isolation  Eye Contact Brief  Facial Expression Sad  Affect Sad  Speech Logical/coherent  Interaction Guarded  Motor Activity Pacing  Appearance/Hygiene In scrubs  Behavior Characteristics Guarded  Mood Preoccupied  Thought Process  Coherency Concrete thinking  Content Preoccupation  Delusions None reported or observed  Perception Hallucinations  Hallucination Auditory  Judgment Impaired  Confusion Mild  Danger to Self  Current suicidal ideation? Denies  Self-Injurious Behavior No self-injurious ideation or behavior indicators observed or expressed   Agreement Not to Harm Self Yes  Description of Agreement verbal  Danger to Others  Danger to Others None reported or observed

## 2024-05-17 NOTE — Progress Notes (Signed)
 10/ 29/2025 Letter to the Court Reg: [name] DOB: 03-20-1961  [name] has been hospitalized since at [facility]) inpatient psychiatric unit. She is currently being treated for Schizophrenia and Major Neurocognitive disorder (Dementia) of rapid decline in functioning. Patient is very disorganized, aggressive, refusing medications and assessments. She is responding to medications very slowly. Patient is oriented to only self and remains very confused about daily activities, she needs complete help with her ADLs like bathing, dressing, medications etc., Patient is unable to care for self. APS is fling for guardianship. As this process is time consuming patient has no safe place to go with out supervision.  We appreciate the court's assistance in stabilizing and currently we request another 30 days of inpatient care to further stabilize.  Sincerely, Baer Hinton.MD [facility]

## 2024-05-17 NOTE — Group Note (Signed)
 Date:  05/17/2024 Time:  11:24 PM  Group Topic/Focus:  Self Care:   The focus of this group is to help patients understand the importance of self-care in order to improve or restore emotional, physical, spiritual, interpersonal, and financial health.    Participation Level:  Did Not Attend  Participation Quality:  Did Not Attend  Affect:  Did Not Attend  Cognitive:  Did Not Attend  Insight: None  Engagement in Group:  None  Modes of Intervention:  Did Not Attend  Additional Comments:    Laymon ONEIDA Finder 05/17/2024, 11:24 PM

## 2024-05-17 NOTE — Group Note (Signed)
 Date:  05/17/2024 Time:  10:10 AM  Group Topic/Focus:  Community Meeting    Participation Level:  Did Not Attend    [name] Bias 05/17/2024, 10:10 AM

## 2024-05-17 NOTE — Progress Notes (Signed)
 [facility] MD Progress Note  05/17/2024 12:26 PM [name]  MRN:  [id number]  [name] is a 63 y.o. female admitted: Medicallyfor 05/02/2024 10:56 AM for dehydration, failure to thrive and hypoglycemia. She carries the psychiatric diagnoses of Schizophrenia and has medical hx of hypoglycemia and failure to thrive and has a past medical history of sinus bradycardia, chronic anemia, severe malnutrition and recurrent hypoglycemia.. Her current presentation of verbal aggression, agitation and refusal to eat with severe paranoia is most consistent with paranoid schizophrenia. [name]  She was at [facility] where she was admitted on 04/30/2024.  She was transferred to the ED because of failure to thrive and was admitted to medical floor.Patient is on IVC and patient has been adamantly refusing all oral medications and refusing to eat.  Patient is admitted to [facility] unit with Q15 min safety monitoring. Multidisciplinary team approach is offered. Medication management; group/milieu therapy is offered.    Subjective:  Chart reviewed, case discussed in multidisciplinary meeting, patient seen during rounds.   Patient is noted to be standing in her room.  She remains disorganized, mumbling to herself.  Patient denies any physical pain and per nursing patient does come out of her room to eat her meals.  Patient refused medications today morning and she did not engage in any proper assessment with the provider today.  She remains discharge focused but is not willing to discuss whom she lives with, her transportation had treatment.  Patient is unable to make any decisions regarding her clinical care at this time.  Patient did not respond to questions regarding hallucinations, SI/HI.  Social work team has reached out to patient's brother and APS to see who will be the decision maker or legal next of kin to help the medical team with concerns.  APS case worker wants the team to reach out to the brother and involved the case worker  to to make the medication decisions.  Sleep: Fair  Appetite:  Poor  Past Psychiatric History: see h&P Family History: History reviewed. No pertinent family history. Social History:  Social History   Substance and Sexual Activity  Alcohol Use Not Currently   Comment: Refuses to disclose how much     Social History   Substance and Sexual Activity  Drug Use No   Comment: Refuses to answer    Social History   Socioeconomic History   Marital status: Single    Spouse name: Not on file   Number of children: Not on file   Years of education: Not on file   Highest education level: Not on file  Occupational History   Not on file  Tobacco Use   Smoking status: Every Day    Current packs/day: 2.00    Types: Cigarettes    Passive exposure: Current   Smokeless tobacco: Never  Vaping Use   Vaping status: Never Used  Substance and Sexual Activity   Alcohol use: Not Currently    Comment: Refuses to disclose how much   Drug use: No    Comment: Refuses to answer   Sexual activity: Not Currently    Comment: refused to answer  Other Topics Concern   Not on file  Social History Narrative   Not on file   Social Drivers of Health   Financial Resource Strain: Not on file  Food Insecurity: Patient Declined (05/06/2024)   Hunger Vital Sign    Worried About Running Out of Food in the Last Year: Patient declined    Ran Out  of Food in the Last Year: Patient declined  Transportation Needs: Patient Declined (05/06/2024)   PRAPARE - Administrator, Civil Service (Medical): Patient declined    Lack of Transportation (Non-Medical): Patient declined  Physical Activity: Not on file  Stress: Not on file  Social Connections: Not on file   Past Medical History:  Past Medical History:  Diagnosis Date   Anemia 10/02/2021   Non compliance w medication regimen    Schizophrenia (HCC)     Past Surgical History:  Procedure Laterality Date   BIOPSY  09/25/2021   Procedure: BIOPSY;   Surgeon: Teressa Toribio SQUIBB, MD;  Location: [facility];  Service: Gastroenterology;;   ESOPHAGOGASTRODUODENOSCOPY (EGD) WITH PROPOFOL  [name] 09/25/2021   Procedure: ESOPHAGOGASTRODUODENOSCOPY (EGD) WITH PROPOFOL ;  Surgeon: Teressa Toribio SQUIBB, MD;  Location: [facility];  Service: Gastroenterology;  Laterality: [name];  With NGT placement    Current Medications: Current Facility-Administered Medications  Medication Dose Route Frequency Provider Last Rate Last Admin   acetaminophen  (TYLENOL ) tablet 650 mg  650 mg Oral Q6H PRN Starkes-Perry, Takia S, FNP       alum & mag hydroxide-simeth (MAALOX/MYLANTA) 200-200-20 MG/5ML suspension 30 mL  30 mL Oral Q4H PRN Starkes-Perry, Takia S, FNP       HYDROcodone-acetaminophen  (NORCO/VICODIN) 5-325 MG per tablet 1-2 tablet  1-2 tablet Oral Q4H PRN Starkes-Perry, Takia S, FNP       magnesium  hydroxide (MILK OF MAGNESIA) suspension 30 mL  30 mL Oral Daily PRN Starkes-Perry, Takia S, FNP       multivitamin with minerals tablet 1 tablet  1 tablet Oral Daily Wilkie Majel RAMAN, FNP   1 tablet at 05/16/24 1004   OLANZapine  (ZYPREXA ) tablet 10 mg  10 mg Oral BID Jacarra Bobak, MD   10 mg at 05/16/24 2118   Or   OLANZapine  (ZYPREXA ) injection 10 mg  10 mg Intramuscular BID Hailee Hollick, MD   10 mg at 05/17/24 9042   OLANZapine  (ZYPREXA ) injection 5 mg  5 mg Intramuscular TID PRN Wilkie Majel RAMAN, FNP       OLANZapine  zydis (ZYPREXA ) disintegrating tablet 5 mg  5 mg Oral TID PRN Starkes-Perry, Takia S, FNP       ondansetron  (ZOFRAN ) tablet 4 mg  4 mg Oral Q6H PRN Starkes-Perry, Majel RAMAN, FNP       Or   ondansetron  (ZOFRAN ) injection 4 mg  4 mg Intravenous Q6H PRN Starkes-Perry, Majel RAMAN, FNP       thiamine  (VITAMIN B1) tablet 100 mg  100 mg Oral Daily Wilkie Majel RAMAN, FNP   100 mg at 05/16/24 1004   traZODone  (DESYREL ) tablet 50 mg  50 mg Oral QHS Starkes-Perry, Takia S, FNP   50 mg at 05/16/24 2118    Lab Results:  No results found for this or any  previous visit (from the past 48 hours).   Blood Alcohol level:  Lab Results  Component Value Date   [facility] <15 05/02/2024   ETH <15 05/01/2024    Metabolic Disorder Labs: Lab Results  Component Value Date   HGBA1C 5.6 05/14/2024   MPG 114.02 05/14/2024   MPG 116.89 05/02/2024   Lab Results  Component Value Date   PROLACTIN 13.6 04/16/2015   Lab Results  Component Value Date   CHOL 219 (H) 05/14/2024   TRIG 84 05/14/2024   HDL 83 05/14/2024   CHOLHDL 2.6 05/14/2024   VLDL 17 05/14/2024   LDLCALC 119 (H) 05/14/2024   LDLCALC 130 (H) 05/01/2024  Physical Findings: AIMS:  , ,  ,  ,    CIWA:    COWS:      Psychiatric Specialty Exam:  Presentation  General Appearance:  Casual  Eye Contact: Fair  Speech: Pressured  Speech Volume: Increased    Mood and Affect  Mood: Angry  Affect: Congruent   Thought Process  Thought Processes: Disorganized  Descriptions of Associations:Tangential  Orientation:Partial  Thought Content:Illogical; Tangential  Hallucinations:RESPONDING TO INTERNAL STIMULI Ideas of Reference:Paranoia; Delusions; Percusatory  Suicidal Thoughts:not endorsing Homicidal Thoughts:nor endorsing  Sensorium  Memory: Recent Poor  Judgment: Poor  Insight: Poor   Executive Functions  Concentration: Poor  Attention Span: Poor  Recall: Poor  Fund of Knowledge: Poor  Language: Poor   Psychomotor Activity  Psychomotor Activity:No data recorded Musculoskeletal: Strength & Muscle Tone: within normal limits Gait & Station: normal Assets  Assets: Resilience    Physical Exam: Physical Exam Vitals and nursing note reviewed.    ROS Blood pressure 112/64, pulse (!) 52, temperature (!) 97.5 F (36.4 C), resp. rate 16, height 5' 4 (1.626 m), weight 44.9 kg, SpO2 100%. Body mass index is 16.99 kg/m.  Diagnosis: Active Problems:   Schizophrenia, paranoid (HCC) Paranoid  PLAN: Safety and Monitoring:  --  Involuntary admission to inpatient psychiatric unit for safety, stabilization and treatment  -- Daily contact with patient to assess and evaluate symptoms and progress in treatment  -- Patient's case to be discussed in multi-disciplinary team meeting  -- Observation Level : q15 minute checks  -- Vital signs:  q12 hours  -- Precautions: suicide, elopement, and assault -- Encouraged patient to participate in unit milieu and in scheduled group therapies  2. Psychiatric Diagnoses and Treatment: Patient currently has no appointed legal guardian but her brother is trying to get the guardianship.  Will reach out to APS to see whom to approach to get consent on additional medications. 2 physicians approved non emergent forced medications-Dr.Madaram and Dr.Dawnita Molner  Zyprexa  7.5 mg bID oral or Im -non emergent forced medication Will add second antipsychotic to help with the residual psychotic symptoms like Prolixin  . Discharge Planning:   -- Social work and case management to assist with discharge planning and identification of hospital follow-up needs prior to discharge  -- Estimated LOS: 3-4 days  Allyn Foil, MD 05/17/2024, 12:26 PM

## 2024-05-18 NOTE — Group Note (Signed)
 Recreation Therapy Group Note   Group Topic:Relaxation  Group Date: 05/18/2024 Start Time: 1105 End Time: 1130 Facilitators: Celestia Jeoffrey BRAVO, LRT, CTRS Location: Dayroom  Group Description: Meditation. LRT and patients discussed what they know about meditation and mindfulness. LRT played a Deep Breathing Meditation exercise script for patients to follow along to. LRT and patients discussed how meditation and deep breathing can be used as a coping skill post--discharge to help manage symptoms of stress.   Goal Area(s) Addressed: Patient will practice using relaxation technique. Patient will identify a new coping skill.  Patient will follow multistep directions to reduce anxiety and stress.  Affect/Mood: [name]   Participation Level: Did not attend    Clinical Observations/Individualized Feedback: Patient did not attend group.   Plan: Continue to engage patient in RT group sessions 2-3x/week.   Jeoffrey BRAVO Celestia, LRT, CTRS 05/18/2024 1:50 PM

## 2024-05-18 NOTE — BHH Counselor (Signed)
 CSW contacted pt's brother Delancey Moraes [phone]) with provider per provider's request.   Provider gave updates regarding pt's progression.   Remene reports at this time he has already applied for guardianship of pt.   Remene reports that he has concerns about pt returning back home to live with their mother who is 63 years old.   Remene reports that pt has aggressive behaviors and he would feel comfortable if pt were to be placed following discharge

## 2024-05-18 NOTE — Group Note (Signed)
 Date:  05/18/2024 Time:  10:51 AM  Group Topic/Focus:  Healthy Communication:   The focus of this group is to discuss communication, barriers to communication, as well as healthy ways to communicate with others.    Participation Level:  Did Not Attend   Leigh VEAR Pais 05/18/2024, 10:51 AM

## 2024-05-18 NOTE — BHH Counselor (Signed)
 CSW contacted [facility] APS caseworker Daphene Johnson [phone]) with provider to get clarification on who would be responsible for decision making as pt's legal guardian has passed.   CSW unable to reach, left HIPAA compliant VM requesting return call.   [name] Croft, MSW, [place] 05/18/2024 3:19 PM

## 2024-05-18 NOTE — Plan of Care (Signed)
  Problem: Education: Goal: Verbalization of understanding the information provided will improve Outcome: Progressing   Problem: Coping: Goal: Ability to demonstrate self-control will improve Outcome: Progressing   Problem: Nutrition: Goal: Adequate nutrition will be maintained Outcome: Progressing   Problem: Activity: Goal: Interest or engagement in activities will improve Outcome: Not Progressing

## 2024-05-18 NOTE — BH IP Treatment Plan (Signed)
 Interdisciplinary Treatment and Diagnostic Plan Update  05/18/2024 Time of Session: 3:15 PM  [name] MRN: [id number]  Principal Diagnosis: <principal problem not specified>  Secondary Diagnoses: Active Problems:   Schizophrenia, paranoid (HCC)   Current Medications:  Current Facility-Administered Medications  Medication Dose Route Frequency Provider Last Rate Last Admin   acetaminophen  (TYLENOL ) tablet 650 mg  650 mg Oral Q6H PRN Starkes-Perry, Takia S, FNP       alum & mag hydroxide-simeth (MAALOX/MYLANTA) 200-200-20 MG/5ML suspension 30 mL  30 mL Oral Q4H PRN Starkes-Perry, Takia S, FNP       HYDROcodone-acetaminophen  (NORCO/VICODIN) 5-325 MG per tablet 1-2 tablet  1-2 tablet Oral Q4H PRN Starkes-Perry, Takia S, FNP       magnesium  hydroxide (MILK OF MAGNESIA) suspension 30 mL  30 mL Oral Daily PRN Starkes-Perry, Takia S, FNP       multivitamin with minerals tablet 1 tablet  1 tablet Oral Daily Starkes-Perry, Takia S, FNP   1 tablet at 05/18/24 9150   OLANZapine  (ZYPREXA ) tablet 10 mg  10 mg Oral BID Jadapalle, Sree, MD   10 mg at 05/18/24 9150   Or   OLANZapine  (ZYPREXA ) injection 10 mg  10 mg Intramuscular BID Jadapalle, Sree, MD   10 mg at 05/17/24 9042   OLANZapine  (ZYPREXA ) injection 5 mg  5 mg Intramuscular TID PRN Wilkie Majel RAMAN, FNP       OLANZapine  zydis (ZYPREXA ) disintegrating tablet 5 mg  5 mg Oral TID PRN Starkes-Perry, Takia S, FNP       ondansetron  (ZOFRAN ) tablet 4 mg  4 mg Oral Q6H PRN Starkes-Perry, Majel RAMAN, FNP       Or   ondansetron  (ZOFRAN ) injection 4 mg  4 mg Intravenous Q6H PRN Starkes-Perry, Majel RAMAN, FNP       thiamine  (VITAMIN B1) tablet 100 mg  100 mg Oral Daily Wilkie Majel RAMAN, FNP   100 mg at 05/18/24 9150   traZODone  (DESYREL ) tablet 50 mg  50 mg Oral QHS Starkes-Perry, Takia S, FNP   50 mg at 05/17/24 2103   PTA Medications: Medications Prior to Admission  Medication Sig Dispense Refill Last Dose/Taking   feeding supplement (ENSURE  PLUS HIGH PROTEIN) LIQD Take 237 mLs by mouth 3 (three) times daily between meals.      Multiple Vitamin (MULTIVITAMIN WITH MINERALS) TABS tablet Take 1 tablet by mouth daily.      OLANZapine  (ZYPREXA ) 5 MG tablet Take 1 tablet (5 mg total) by mouth 2 (two) times daily.      OLANZapine  (ZYPREXA ) injection Inject 1 mL (5 mg total) into the muscle 2 (two) times daily.      thiamine  (VITAMIN B-1) 100 MG tablet Take 1 tablet (100 mg total) by mouth daily.      traZODone  (DESYREL ) 50 MG tablet Take 1 tablet (50 mg total) by mouth at bedtime.       Patient Stressors:    Patient Strengths:    Treatment Modalities: Medication Management, Group therapy, Case management,  1 to 1 session with clinician, Psychoeducation, Recreational therapy.   Physician Treatment Plan for Primary Diagnosis: <principal problem not specified> Long Term Goal(s): Improvement in symptoms so as ready for discharge   Short Term Goals: Ability to identify changes in lifestyle to reduce recurrence of condition will improve  Medication Management: Evaluate patient's response, side effects, and tolerance of medication regimen.  Therapeutic Interventions: 1 to 1 sessions, Unit Group sessions and Medication administration.  Evaluation of Outcomes: Not Progressing  Physician Treatment Plan for Secondary Diagnosis: Active Problems:   Schizophrenia, paranoid (HCC)  Long Term Goal(s): Improvement in symptoms so as ready for discharge   Short Term Goals: Ability to identify changes in lifestyle to reduce recurrence of condition will improve     Medication Management: Evaluate patient's response, side effects, and tolerance of medication regimen.  Therapeutic Interventions: 1 to 1 sessions, Unit Group sessions and Medication administration.  Evaluation of Outcomes: Not Progressing   RN Treatment Plan for Primary Diagnosis: <principal problem not specified> Long Term Goal(s): Knowledge of disease and therapeutic regimen  to maintain health will improve  Short Term Goals: Ability to remain free from injury will improve, Ability to verbalize frustration and anger appropriately will improve, Ability to demonstrate self-control, Ability to participate in decision making will improve, Ability to verbalize feelings will improve, Ability to disclose and discuss suicidal ideas, Ability to identify and develop effective coping behaviors will improve, and Compliance with prescribed medications will improve  Medication Management: RN will administer medications as ordered by provider, will assess and evaluate patient's response and provide education to patient for prescribed medication. RN will report any adverse and/or side effects to prescribing provider.  Therapeutic Interventions: 1 on 1 counseling sessions, Psychoeducation, Medication administration, Evaluate responses to treatment, Monitor vital signs and CBGs as ordered, Perform/monitor CIWA, COWS, AIMS and Fall Risk screenings as ordered, Perform wound care treatments as ordered.  Evaluation of Outcomes: Not Progressing   LCSW Treatment Plan for Primary Diagnosis: <principal problem not specified> Long Term Goal(s): Safe transition to appropriate next level of care at discharge, Engage patient in therapeutic group addressing interpersonal concerns.  Short Term Goals: Engage patient in aftercare planning with referrals and resources, Increase social support, Increase ability to appropriately verbalize feelings, Increase emotional regulation, Facilitate acceptance of mental health diagnosis and concerns, Facilitate patient progression through stages of change regarding substance use diagnoses and concerns, Identify triggers associated with mental health/substance abuse issues, and Increase skills for wellness and recovery  Therapeutic Interventions: Assess for all discharge needs, 1 to 1 time with Social worker, Explore available resources and support systems, Assess for  adequacy in community support network, Educate family and significant other(s) on suicide prevention, Complete Psychosocial Assessment, Interpersonal group therapy.  Evaluation of Outcomes: Not Progressing   Progress in Treatment: Attending groups: No. Participating in groups: No. Taking medication as prescribed: Yes. Toleration medication: Yes. Family/Significant other contact made: No, will contact:  legal guardian Patient understands diagnosis: No. Discussing patient identified problems/goals with staff: Yes. Medical problems stabilized or resolved: Yes. Denies suicidal/homicidal ideation: Yes. Issues/concerns per patient self-inventory: No. Other:    New problem(s) identified: No, Describe:  None identified. Update: 05/13/2024 No changes at this time. Update: 05/18/2024 No changes at this time.     New Short Term/Long Term Goal(s): elimination of symptoms of psychosis, medication management for mood stabilization; elimination of SI thoughts; development of comprehensive mental wellness plan. Update: 05/13/2024 No changes at this time.Update: 05/18/2024 No changes at this time.   Patient Goals:  I just need to leave, I don't need it hereUpdate: 05/13/2024 No changes at this time.Update: 05/18/2024 No changes at this time.   Discharge Plan or Barriers: CSW will assist with appropriate discharge planning. Update: 05/13/2024 No changes at this time.Update: 05/18/2024 No changes at this time.   Reason for Continuation of Hospitalization: Aggression Hallucinations Medication stabilization   Estimated Length of Stay: 1 to 7 days. Update: 05/13/2024 TBDUpdate: 05/18/2024 TBD  Last 3 [place] Suicide Severity  Risk Score: Flowsheet Row Admission (Current) from 05/06/2024 in [facility] ED to Hosp-Admission (Discharged) from 05/02/2024 in [facility] [facility] ED from 04/30/2024 in [facility]  C-SSRS RISK CATEGORY No  Risk No Risk No Risk    Last [facility] 2/9 Scores:     No data to display          Scribe for Treatment Team: [name], [place] 05/18/2024 4:06 PM

## 2024-05-18 NOTE — Progress Notes (Signed)
 Behavior:  Cooperative with meds and VS.     Psych assessment:  Denies SI.  Interaction / Group attendance:  Isolates to room.  Does not attend groups.  Minimal, guarded interaction.  Medication/ PRNs: Compliant.  Pain: Denies  15 min checks in place for safety.

## 2024-05-18 NOTE — Group Note (Signed)

## 2024-05-18 NOTE — Group Note (Signed)
 Recreation Therapy Group Note   Group Topic:Coping Skills  Group Date: 05/18/2024 Start Time: 1500 End Time: 1535 Facilitators: Celestia Jeoffrey BRAVO, LRT, CTRS Location: Courtyard  Group Description: Music. Patients are encouraged to name their favorite song(s) for LRT to play song through speaker for group to hear, while in the courtyard getting fresh air and sunlight. Patients educated on the definition of leisure and the importance of having different leisure interests outside of the hospital. Group discussed how leisure activities can often be used as pharmacologist and that listening to music and being outside are examples.    Goal Area(s) Addressed:  Patient will identify a current leisure interest.  Patient will practice making a positive decision. Patient will have the opportunity to try a new leisure activity.   Affect/Mood: [name]   Participation Level: Did not attend    Clinical Observations/Individualized Feedback: Patient did not attend group.   Plan: Continue to engage patient in RT group sessions 2-3x/week.   Jeoffrey BRAVO Celestia, LRT, CTRS 05/18/2024 4:44 PM
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
## 2024-05-18 NOTE — Plan of Care (Signed)
   Problem: Education: Goal: Emotional status will improve Outcome: Not Progressing Goal: Mental status will improve Outcome: Not Progressing   Problem: Activity: Goal: Interest or engagement in activities will improve Outcome: Not Progressing

## 2024-05-19 NOTE — Progress Notes (Signed)
 Behavior:  Cooperative with meds and VS.   Psych assessment:  Irritable and pacing in room.  Denies SI.  Interaction / Group attendance:  Isolates to room with the exception of meals. No interaction.  Medication/ PRNs:  Compliant.  I'm not supposed to take no meds, but okay.   Pain:  Denies  15 min checks in place for safety.

## 2024-05-19 NOTE — Plan of Care (Signed)
  Problem: Education: Goal: Mental status will improve Outcome: Not Progressing   Problem: Activity: Goal: Interest or engagement in activities will improve Outcome: Not Progressing Goal: Sleeping patterns will improve Outcome: Progressing   Problem: Nutrition: Goal: Adequate nutrition will be maintained Outcome: Not Progressing   Problem: Skin Integrity: Goal: Risk for impaired skin integrity will decrease Outcome: Not Progressing

## 2024-05-19 NOTE — Group Note (Signed)
 Date:  05/19/2024 Time:  10:17 AM  Group Topic/Focus:  Movement Therapy    Participation Level:  Did Not Attend    [name] 05/19/2024, 10:17 AM

## 2024-05-19 NOTE — Progress Notes (Signed)
   05/19/24 0000  Psych Admission Type (Psych Patients Only)  Admission Status Involuntary  Psychosocial Assessment  Patient Complaints Isolation  Eye Contact Brief  Facial Expression Flat  Affect Flat  Speech Logical/coherent  Interaction Isolative  Motor Activity Pacing  Appearance/Hygiene In scrubs  Behavior Characteristics Guarded  Mood Preoccupied  Thought Process  Coherency Concrete thinking  Content Preoccupation  Delusions None reported or observed  Perception Hallucinations  Hallucination Auditory  Judgment Impaired  Confusion Mild  Danger to Self  Current suicidal ideation? Denies  Agreement Not to Harm Self Yes  Description of Agreement Verbalized  Danger to Others  Danger to Others None reported or observed
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
## 2024-05-19 NOTE — Group Note (Signed)
 Physical/Occupational Therapy Group Note  Group Topic: Pain Management and Coping   Group Date: 05/19/2024 Start Time: 1305 End Time: 1405 Facilitators: Clive Warren CROME, OT    Group Description:  Group discussed impact of chronic/acute pain on safety and independence with functional tasks and impact on mental health.  Identified and discussed any previously learned or implemented strategies used.  Discussed and reviewed cognitive behavioral pain coping strategies to address/improve overall management of pain. Discussed relaxation, distraction techniques, cognitive restructuring, activity pacing/energy conservation, environment/home safety modifications, and role of sleep and sleep hygiene. Allowed time for questions and further discussion.      Therapeutic Goal(s):   Identify and discuss previously utilized pain coping strategies and implications of pain on function/well-being Identify and discuss implementing new cognitive behavioral pain coping strategies into daily routines Demonstrate understanding and performance of learned cognitive behavioral pain coping strategies.    Individual Participation: Pt did not attend.    Participation Level: Did not attend   Participation Quality:   Behavior:   Speech/Thought Process:   Affect/Mood:   Insight:   Judgement:   Modes of Intervention:   Patient Response to Interventions:    Plan: Continue to engage patient in PT/OT groups 1 - 2x/week.  Tandy Grawe R., MPH, MS, OTR/L ascom [phone] 05/19/24, 4:20 PM

## 2024-05-19 NOTE — Group Note (Signed)
 Recreation Therapy Group Note   Group Topic:Other  Group Date: 05/19/2024 Start Time: 1400 End Time: 1500 Facilitators: Celestia Jeoffrey BRAVO, LRT, CTRS Location: Dayroom and Hallway  Group Description: Bag Decoration and Trick or Treating.  Patients received a brown paper bag to decorate for Halloween. Once complete, patients walked down the hall, with Halloween music playing in the background, to go trick or treating with LRT. Patients received candy with RN approval.   Goal Area(s) Addressed:  Patients will express themselves through art Patients will recall past holiday memories  Patients will increase communication    Affect/Mood: [name]   Participation Level: Did not attend    Clinical Observations/Individualized Feedback: Patient did not attend group.   Plan: Continue to engage patient in RT group sessions 2-3x/week.   Jeoffrey BRAVO Celestia, LRT, CTRS 05/19/2024 3:08 PM

## 2024-05-19 NOTE — Plan of Care (Signed)
  Problem: Coping: Goal: Ability to demonstrate self-control will improve Outcome: Progressing   Problem: Health Behavior/Discharge Planning: Goal: Compliance with treatment plan for underlying cause of condition will improve Outcome: Progressing   Problem: Education: Goal: Emotional status will improve Outcome: Not Progressing Goal: Mental status will improve Outcome: Not Progressing   Problem: Activity: Goal: Interest or engagement in activities will improve Outcome: Not Progressing

## 2024-05-20 NOTE — Group Note (Signed)
 Date:  05/20/2024 Time:  4:04 PM  Group Topic/Focus:  Leisure/Bingo Group: The purpose of this group is to allow patients to participate in a fun activity with peers and interact with each other. [                                                                                                                                Participation Level:  Did Not Attend  [name] 05/20/2024, 4:04 PM

## 2024-05-20 NOTE — Progress Notes (Signed)
   05/20/24 1000  Psych Admission Type (Psych Patients Only)  Admission Status Involuntary  Psychosocial Assessment  Patient Complaints Irritability;Isolation  Eye Contact Brief  Facial Expression Flat  Affect Flat  Speech Logical/coherent  Interaction Isolative  Motor Activity Pacing  Appearance/Hygiene In scrubs  Behavior Characteristics Cooperative  Mood Sad  Thought Process  Coherency Disorganized  Content Preoccupation  Delusions None reported or observed  Perception Hallucinations  Hallucination Auditory  Judgment Impaired  Confusion Mild  Danger to Self  Current suicidal ideation? Denies  Agreement Not to Harm Self No  Description of Agreement verbal  Danger to Others  Danger to Others None reported or observed

## 2024-05-20 NOTE — Progress Notes (Signed)
   05/20/24 0014  Psych Admission Type (Psych Patients Only)  Admission Status Involuntary  Psychosocial Assessment  Patient Complaints Irritability;Isolation  Eye Contact Brief  Facial Expression Flat  Affect Flat;Irritable  Speech Logical/coherent  Interaction Isolative;No initiation  Motor Activity Pacing  Appearance/Hygiene In scrubs;Poor hygiene  Behavior Characteristics Cooperative;Guarded  Mood Preoccupied  Thought Process  Coherency Concrete thinking  Content Preoccupation  Delusions None reported or observed  Perception Hallucinations  Hallucination Auditory  Judgment Impaired  Confusion Mild  Danger to Self  Current suicidal ideation? Denies  Self-Injurious Behavior No self-injurious ideation or behavior indicators observed or expressed   Agreement Not to Harm Self No  Description of Agreement verbal  Danger to Others  Danger to Others None reported or observed

## 2024-05-20 NOTE — Plan of Care (Signed)
  Problem: Education: Goal: Verbalization of understanding the information provided will improve Outcome: Progressing   Problem: Activity: Goal: Sleeping patterns will improve Outcome: Progressing   Problem: Coping: Goal: Ability to demonstrate self-control will improve Outcome: Progressing   Problem: Health Behavior/Discharge Planning: Goal: Compliance with treatment plan for underlying cause of condition will improve Outcome: Progressing

## 2024-05-20 NOTE — Plan of Care (Signed)
   Problem: Activity: Goal: Interest or engagement in activities will improve Outcome: Progressing Goal: Sleeping patterns will improve Outcome: Progressing
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
## 2024-05-20 NOTE — Group Note (Signed)
 Date:  05/20/2024 Time:  10:31 AM  Group Topic/Focus:  Coping With Mental Health Crisis:   The purpose of this group is to help patients identify strategies for coping with mental health crisis.  Group discusses possible causes of crisis and ways to manage them effectively.    Participation Level:  Did Not Attend  Leigh VEAR Pais 05/20/2024, 10:31 AM

## 2024-05-21 NOTE — Plan of Care (Signed)
  Problem: Education: Goal: Verbalization of understanding the information provided will improve Outcome: Progressing   Problem: Activity: Goal: Sleeping patterns will improve 05/21/2024 1647 by Rendell Cooper SAILOR, RN Outcome: Progressing 05/21/2024 0515 by Rendell Cooper SAILOR, RN Outcome: Progressing   Problem: Coping: Goal: Ability to verbalize frustrations and anger appropriately will improve 05/21/2024 1647 by Rendell Cooper SAILOR, RN Outcome: Progressing 05/21/2024 0515 by Rendell Cooper SAILOR, RN Outcome: Progressing Goal: Ability to demonstrate self-control will improve Outcome: Progressing   Problem: Health Behavior/Discharge Planning: Goal: Compliance with treatment plan for underlying cause of condition will improve Outcome: Progressing

## 2024-05-21 NOTE — Group Note (Signed)
 [facility] LCSW Group Therapy Note   Group Date: 05/21/2024 Start Time: 1300 End Time: 1400   Type of Therapy/Topic:  Group Therapy:  Emotion Regulation  Participation Level:  Did Not Attend   Mood:  Description of Group:    The purpose of this group is to assist patients in learning to regulate negative emotions and experience positive emotions. Patients will be guided to discuss ways in which they have been vulnerable to their negative emotions. These vulnerabilities will be juxtaposed with experiences of positive emotions or situations, and patients challenged to use positive emotions to combat negative ones. Special emphasis will be placed on coping with negative emotions in conflict situations, and patients will process healthy conflict resolution skills.  Therapeutic Goals: Patient will identify two positive emotions or experiences to reflect on in order to balance out negative emotions:  Patient will label two or more emotions that they find the most difficult to experience:  Patient will be able to demonstrate positive conflict resolution skills through discussion or role plays:   Summary of Patient Progress:   Patient did not attend group    Therapeutic Modalities:   Cognitive Behavioral Therapy Feelings Identification Dialectical Behavioral Therapy   Aldo CHRISTELLA Niece, LCSW

## 2024-05-21 NOTE — Progress Notes (Signed)
   05/21/24 1200  Psych Admission Type (Psych Patients Only)  Admission Status Involuntary  Psychosocial Assessment  Patient Complaints Isolation  Eye Contact Brief  Facial Expression Flat  Affect Flat  Speech Logical/coherent  Interaction Isolative  Motor Activity Pacing  Appearance/Hygiene In scrubs  Behavior Characteristics Cooperative  Mood Labile  Thought Process  Coherency Disorganized  Content Preoccupation  Delusions None reported or observed  Perception Hallucinations  Hallucination Auditory  Judgment Impaired  Confusion Mild  Danger to Self  Current suicidal ideation? Denies  Agreement Not to Harm Self No  Description of Agreement verbal  Danger to Others  Danger to Others None reported or observed

## 2024-05-21 NOTE — Group Note (Signed)
 Date:  05/21/2024 Time:  10:24 AM  Group Topic/Focus:  Movement Therapy    Participation Level:  Did Not Attend    [name] Bias 05/21/2024, 10:24 AM
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
## 2024-05-21 NOTE — Progress Notes (Signed)
   05/20/24 2304  Psych Admission Type (Psych Patients Only)  Admission Status Involuntary  Psychosocial Assessment  Patient Complaints Isolation;Irritability  Eye Contact Brief  Facial Expression Flat  Affect Flat  Speech Logical/coherent  Interaction Isolative  Motor Activity Pacing  Appearance/Hygiene In scrubs  Behavior Characteristics Cooperative;Guarded  Mood Labile  Thought Process  Coherency Disorganized  Content Preoccupation  Delusions None reported or observed  Perception Hallucinations  Hallucination Auditory  Judgment Impaired  Confusion Mild  Danger to Self  Current suicidal ideation? Denies  Description of Suicide Plan n/a  Self-Injurious Behavior No self-injurious ideation or behavior indicators observed or expressed   Agreement Not to Harm Self No  Description of Agreement verbal  Danger to Others  Danger to Others None reported or observed

## 2024-05-21 NOTE — Plan of Care (Signed)
  Problem: Activity: Goal: Sleeping patterns will improve Outcome: Progressing   Problem: Coping: Goal: Ability to verbalize frustrations and anger appropriately will improve Outcome: Progressing   Problem: Health Behavior/Discharge Planning: Goal: Compliance with treatment plan for underlying cause of condition will improve Outcome: Progressing

## 2024-05-22 NOTE — Progress Notes (Signed)
 Patient is an involuntary admission to [facility] for schizophrenia.  Patient mostly isolates but comes out for meals and takes her medication with no problems.  Today she did come out for a short period and watched tv with her peers.  Denies SI, HI, AVH, anxiety and depression.  Will continue to monitor.
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
## 2024-05-22 NOTE — Plan of Care (Signed)
  Problem: Education: Goal: Verbalization of understanding the information provided will improve 05/22/2024 0505 by Rendell Cooper SAILOR, RN Outcome: Progressing 05/21/2024 1647 by Rendell Cooper SAILOR, RN Outcome: Progressing   Problem: Activity: Goal: Sleeping patterns will improve 05/22/2024 0505 by Rendell Cooper SAILOR, RN Outcome: Progressing 05/21/2024 1647 by Rendell Cooper SAILOR, RN Outcome: Progressing   Problem: Coping: Goal: Ability to verbalize frustrations and anger appropriately will improve 05/22/2024 0505 by Rendell Cooper SAILOR, RN Outcome: Progressing 05/21/2024 1647 by Rendell Cooper SAILOR, RN Outcome: Progressing Goal: Ability to demonstrate self-control will improve 05/22/2024 0505 by Rendell Cooper SAILOR, RN Outcome: Progressing 05/21/2024 1647 by Rendell Cooper SAILOR, RN Outcome: Progressing

## 2024-05-22 NOTE — Group Note (Signed)
 Recreation Therapy Group Note   Group Topic:Other  Group Date: 05/22/2024 Start Time: 1400 End Time: 1445 Facilitators: Celestia Jeoffrey BRAVO, LRT, CTRS Location: Dayroom  Activity Description/Intervention: Therapeutic Drumming. Patients with peers and staff were given the opportunity to engage in a leader facilitated [facility] Group Empowerment Drumming Circle with staff from the [employer], in partnership with [employer]. Teaching laboratory technician and trained [employer], Norleen Mon leading with LRT observing and documenting intervention and pt response. This evidenced-based practice targets 7 areas of health and wellbeing in the human experience including: stress-reduction, exercise, self-expression, camaraderie/support, nurturing, spirituality, and music-making (leisure).    Goal Area(s) Addresses:  Patient will engage in pro-social way in music group.  Patient will follow directions of drum leader on the first prompt. Patient will demonstrate no behavioral issues during group.  Patient will identify if a reduction in stress level occurs as a result of participation in therapeutic drum circle.   Affect/Mood: [name]   Participation Level: Did not attend    Clinical Observations/Individualized Feedback: Patient did not attend group.   Plan: Continue to engage patient in RT group sessions 2-3x/week.   Jeoffrey BRAVO Celestia, LRT, CTRS 05/22/2024 4:11 PM

## 2024-05-22 NOTE — Group Note (Signed)
 Date:  05/22/2024 Time:  11:23 PM  Group Topic/Focus:  Wrap-Up Group:   The focus of this group is to help patients review their daily goal of treatment and discuss progress on daily workbooks.    Participation Level:  Did Not Attend  Participation Quality:     Affect:     Cognitive:     Insight: None  Engagement in Group:  None  Modes of Intervention:     Additional Comments:    [name] 05/22/2024, 11:23 PM

## 2024-05-22 NOTE — Progress Notes (Signed)
   05/22/24 0047  Psych Admission Type (Psych Patients Only)  Admission Status Involuntary  Psychosocial Assessment  Patient Complaints Isolation  Eye Contact Brief  Facial Expression Flat  Affect Flat  Speech Logical/coherent  Interaction Isolative  Motor Activity Pacing  Appearance/Hygiene In scrubs  Behavior Characteristics Cooperative  Mood Labile  Thought Process  Coherency Disorganized  Content Preoccupation  Delusions None reported or observed  Perception Hallucinations  Hallucination Auditory  Judgment Impaired  Confusion Mild  Danger to Self  Current suicidal ideation? Denies  Description of Suicide Plan n/a  Self-Injurious Behavior No self-injurious ideation or behavior indicators observed or expressed   Agreement Not to Harm Self No  Description of Agreement verbal  Danger to Others  Danger to Others None reported or observed

## 2024-05-23 NOTE — Group Note (Signed)
 Date:  05/23/2024 Time:  12:43 PM  Group Topic/Focus:  Overcoming Stress:   The focus of this group is to define stress and help patients assess their triggers.    Participation Level:  Did Not Attend   [name] 05/23/2024, 12:43 PM

## 2024-05-23 NOTE — BH IP Treatment Plan (Signed)
 Interdisciplinary Treatment and Diagnostic Plan Update  05/23/2024 Time of Session: 3:00 PM  [name] MRN: [id number]  Principal Diagnosis: <principal problem not specified>  Secondary Diagnoses: Active Problems:   Schizophrenia, paranoid (HCC)   Current Medications:  Current Facility-Administered Medications  Medication Dose Route Frequency Provider Last Rate Last Admin   acetaminophen  (TYLENOL ) tablet 650 mg  650 mg Oral Q6H PRN Starkes-Perry, Takia S, FNP       alum & mag hydroxide-simeth (MAALOX/MYLANTA) 200-200-20 MG/5ML suspension 30 mL  30 mL Oral Q4H PRN Starkes-Perry, Takia S, FNP       HYDROcodone-acetaminophen  (NORCO/VICODIN) 5-325 MG per tablet 1-2 tablet  1-2 tablet Oral Q4H PRN Starkes-Perry, Takia S, FNP       magnesium  hydroxide (MILK OF MAGNESIA) suspension 30 mL  30 mL Oral Daily PRN Starkes-Perry, Takia S, FNP       multivitamin with minerals tablet 1 tablet  1 tablet Oral Daily Starkes-Perry, Takia S, FNP   1 tablet at 05/23/24 0911   OLANZapine  (ZYPREXA ) tablet 10 mg  10 mg Oral BID Jadapalle, Sree, MD   10 mg at 05/23/24 9088   Or   OLANZapine  (ZYPREXA ) injection 10 mg  10 mg Intramuscular BID Jadapalle, Sree, MD   10 mg at 05/17/24 0957   OLANZapine  (ZYPREXA ) injection 5 mg  5 mg Intramuscular TID PRN Starkes-Perry, Takia S, FNP       OLANZapine  zydis (ZYPREXA ) disintegrating tablet 5 mg  5 mg Oral TID PRN Starkes-Perry, Takia S, FNP       ondansetron  (ZOFRAN ) tablet 4 mg  4 mg Oral Q6H PRN Starkes-Perry, Majel RAMAN, FNP       Or   ondansetron  (ZOFRAN ) injection 4 mg  4 mg Intravenous Q6H PRN Starkes-Perry, Takia S, FNP       thiamine  (VITAMIN B1) tablet 100 mg  100 mg Oral Daily Wilkie Majel RAMAN, FNP   100 mg at 05/23/24 0911   traZODone  (DESYREL ) tablet 50 mg  50 mg Oral QHS Starkes-Perry, Takia S, FNP   50 mg at 05/22/24 2107   PTA Medications: Medications Prior to Admission  Medication Sig Dispense Refill Last Dose/Taking   feeding supplement (ENSURE  PLUS HIGH PROTEIN) LIQD Take 237 mLs by mouth 3 (three) times daily between meals.      Multiple Vitamin (MULTIVITAMIN WITH MINERALS) TABS tablet Take 1 tablet by mouth daily.      OLANZapine  (ZYPREXA ) 5 MG tablet Take 1 tablet (5 mg total) by mouth 2 (two) times daily.      OLANZapine  (ZYPREXA ) injection Inject 1 mL (5 mg total) into the muscle 2 (two) times daily.      thiamine  (VITAMIN B-1) 100 MG tablet Take 1 tablet (100 mg total) by mouth daily.      traZODone  (DESYREL ) 50 MG tablet Take 1 tablet (50 mg total) by mouth at bedtime.       Patient Stressors:    Patient Strengths:    Treatment Modalities: Medication Management, Group therapy, Case management,  1 to 1 session with clinician, Psychoeducation, Recreational therapy.   Physician Treatment Plan for Primary Diagnosis: <principal problem not specified> Long Term Goal(s): Improvement in symptoms so as ready for discharge   Short Term Goals: Ability to identify changes in lifestyle to reduce recurrence of condition will improve  Medication Management: Evaluate patient's response, side effects, and tolerance of medication regimen.  Therapeutic Interventions: 1 to 1 sessions, Unit Group sessions and Medication administration.  Evaluation of Outcomes: Not Progressing  Physician Treatment Plan for Secondary Diagnosis: Active Problems:   Schizophrenia, paranoid (HCC)  Long Term Goal(s): Improvement in symptoms so as ready for discharge   Short Term Goals: Ability to identify changes in lifestyle to reduce recurrence of condition will improve     Medication Management: Evaluate patient's response, side effects, and tolerance of medication regimen.  Therapeutic Interventions: 1 to 1 sessions, Unit Group sessions and Medication administration.  Evaluation of Outcomes: Not Progressing   RN Treatment Plan for Primary Diagnosis: <principal problem not specified> Long Term Goal(s): Knowledge of disease and therapeutic regimen  to maintain health will improve  Short Term Goals: Ability to remain free from injury will improve, Ability to verbalize frustration and anger appropriately will improve, Ability to demonstrate self-control, Ability to participate in decision making will improve, Ability to verbalize feelings will improve, Ability to disclose and discuss suicidal ideas, Ability to identify and develop effective coping behaviors will improve, and Compliance with prescribed medications will improve  Medication Management: RN will administer medications as ordered by provider, will assess and evaluate patient's response and provide education to patient for prescribed medication. RN will report any adverse and/or side effects to prescribing provider.  Therapeutic Interventions: 1 on 1 counseling sessions, Psychoeducation, Medication administration, Evaluate responses to treatment, Monitor vital signs and CBGs as ordered, Perform/monitor CIWA, COWS, AIMS and Fall Risk screenings as ordered, Perform wound care treatments as ordered.  Evaluation of Outcomes: Not Progressing   LCSW Treatment Plan for Primary Diagnosis: <principal problem not specified> Long Term Goal(s): Safe transition to appropriate next level of care at discharge, Engage patient in therapeutic group addressing interpersonal concerns.  Short Term Goals: Engage patient in aftercare planning with referrals and resources, Increase social support, Increase ability to appropriately verbalize feelings, Increase emotional regulation, Facilitate acceptance of mental health diagnosis and concerns, Facilitate patient progression through stages of change regarding substance use diagnoses and concerns, Identify triggers associated with mental health/substance abuse issues, and Increase skills for wellness and recovery  Therapeutic Interventions: Assess for all discharge needs, 1 to 1 time with Social worker, Explore available resources and support systems, Assess for  adequacy in community support network, Educate family and significant other(s) on suicide prevention, Complete Psychosocial Assessment, Interpersonal group therapy.  Evaluation of Outcomes: Not Progressing   Progress in Treatment: Attending groups: No. Participating in groups: No. Taking medication as prescribed: Yes. Toleration medication: Yes. Family/Significant other contact made: No, will contact:  legal guardian Patient understands diagnosis: No. Discussing patient identified problems/goals with staff: Yes. Medical problems stabilized or resolved: Yes. Denies suicidal/homicidal ideation: Yes. Issues/concerns per patient self-inventory: No. Other:    New problem(s) identified: No, Describe:  None identified. Update: 05/13/2024 No changes at this time. Update 05/18/2024:  No changes at this time. Update 05/23/2024:  No changes at this time.     New Short Term/Long Term Goal(s): elimination of symptoms of psychosis, medication management for mood stabilization; elimination of SI thoughts; development of comprehensive mental wellness plan. Update: 05/13/2024 No changes at this time.Update: 05/18/2024 No changes at this time. Update 05/23/2024:  No changes at this time.   Patient Goals:  I just need to leave, I don't need it hereUpdate: 05/13/2024 No changes at this time.Update: 05/18/2024 No changes at this time. Update 05/23/2024:  No changes at this time.   Discharge Plan or Barriers: CSW will assist with appropriate discharge planning. Update: 05/13/2024 No changes at this time.Update: 05/18/2024 No changes at this time. Update 05/23/2024:  No changes at this  time.   Reason for Continuation of Hospitalization: Aggression Hallucinations Medication stabilization   Estimated Length of Stay: 1 to 7 days. Update: 05/13/2024 TBDUpdate: 05/18/2024 TBD Update 05/23/2024:  TBD   Last 3 [place] Suicide Severity Risk Score: Flowsheet Row Admission (Current) from 05/06/2024 in [facility] ED to Hosp-Admission (Discharged) from 05/02/2024 in [place] [facility] ED from 04/30/2024 in [facility]  C-SSRS RISK CATEGORY No Risk No Risk No Risk    Last [facility] 2/9 Scores:     No data to display          Scribe for Treatment Team: [name], ISRAEL 05/23/2024 3:11 PM

## 2024-05-23 NOTE — Group Note (Signed)
 LCSW Group Therapy Note  Group Date: 05/23/2024 Start Time: 1315 End Time: 1400   Type of Therapy and Topic:  Group Therapy - How To Cope with Nervousness about Discharge   Participation Level:  Did Not Attend   Description of Group This process group involved identification of patients' feelings about discharge. Some of them are scheduled to be discharged soon, while others are new admissions, but each of them was asked to share thoughts and feelings surrounding discharge from the hospital. One common theme was that they are excited at the prospect of going home, while another was that many of them are apprehensive about sharing why they were hospitalized. Patients were given the opportunity to discuss these feelings with their peers in preparation for discharge.  Therapeutic Goals  Patient will identify their overall feelings about pending discharge. Patient will think about how they might proactively address issues that they believe will once again arise once they get home (i.e. with parents). Patients will participate in discussion about having hope for change.   Summary of Patient Progress:  X  Therapeutic Modalities Cognitive Behavioral Therapy   [name], LCSWA 05/23/2024  1:52 PM

## 2024-05-23 NOTE — Plan of Care (Signed)
  Problem: Coping: Goal: Ability to demonstrate self-control will improve Outcome: Progressing   Problem: Education: Goal: Emotional status will improve Outcome: Progressing

## 2024-05-23 NOTE — Plan of Care (Signed)
   Problem: Education: Goal: Knowledge of [facility] Education information/materials will improve Outcome: Progressing Goal: Verbalization of understanding the information provided will improve Outcome: Progressing   Problem: Activity: Goal: Interest or engagement in activities will improve Outcome: Progressing

## 2024-05-23 NOTE — Group Note (Signed)
 Recreation Therapy Group Note   Group Topic:Communication  Group Date: 05/23/2024 Start Time: 1400 End Time: 1430 Facilitators: Celestia Jeoffrey BRAVO, LRT, CTRS Location: Dayroom  Group Description: Name That Food. Pt will randomly select 1 cut paper from laminated stack of popular food images from the 1970's-1990's from LRT. Without showing the group the image they selected, pt will use descriptive words to explain what food they selected. Once the image is correctly guessed, LRT and pts will reminisce and discuss past fond memories associated with the food as well as the importance of communication.  Goal Area(s) Addressed: Patient will increase communication skills.  Patient will increase frustration tolerance skills. Patient will reminisce a fond memory in their life.     Affect/Mood: [name]   Participation Level: Did not attend    Clinical Observations/Individualized Feedback: Patient did not attend group.  Plan: Continue to engage patient in RT group sessions 2-3x/week.   Jeoffrey BRAVO Celestia, LRT, CTRS 05/23/2024 4:36 PM

## 2024-05-23 NOTE — Progress Notes (Signed)
   05/23/24 0845  Psych Admission Type (Psych Patients Only)  Admission Status Involuntary  Psychosocial Assessment  Patient Complaints Isolation  Eye Contact Brief  Facial Expression Flat  Affect Flat  Speech Logical/coherent  Interaction Isolative  Motor Activity Pacing  Appearance/Hygiene In scrubs  Behavior Characteristics Cooperative  Mood Labile  Thought Process  Coherency Disorganized  Content Preoccupation  Delusions None reported or observed  Perception Hallucinations  Hallucination Auditory  Judgment Impaired  Confusion Mild  Danger to Self  Current suicidal ideation? Denies  Agreement Not to Harm Self Yes  Description of Agreement verbal  Danger to Others  Danger to Others None reported or observed
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
## 2024-05-24 NOTE — Progress Notes (Signed)
   05/24/24 0920  Psych Admission Type (Psych Patients Only)  Admission Status Involuntary  Psychosocial Assessment  Patient Complaints Isolation  Eye Contact Brief  Facial Expression Flat  Affect Flat  Speech Logical/coherent  Interaction Isolative  Motor Activity Pacing  Appearance/Hygiene In scrubs  Behavior Characteristics Cooperative  Mood Pleasant  Thought Process  Coherency Disorganized  Content Preoccupation  Delusions None reported or observed  Perception WDL  Hallucination None reported or observed  Judgment Impaired  Confusion Mild  Danger to Self  Current suicidal ideation? Denies  Agreement Not to Harm Self Yes  Description of Agreement verbal  Danger to Others  Danger to Others None reported or observed

## 2024-05-24 NOTE — Plan of Care (Signed)
   Problem: Education: Goal: Knowledge of [facility] Education information/materials will improve Outcome: Progressing Goal: Emotional status will improve Outcome: Progressing Goal: Mental status will improve Outcome: Progressing Goal: Verbalization of understanding the information provided will improve Outcome: Progressing   Problem: Activity: Goal: Interest or engagement in activities will improve Outcome: Progressing Goal: Sleeping patterns will improve Outcome: Progressing   Problem: Coping: Goal: Ability to verbalize frustrations and anger appropriately will improve Outcome: Progressing Goal: Ability to demonstrate self-control will improve Outcome: Progressing   Problem: Health Behavior/Discharge Planning: Goal: Identification of resources available to assist in meeting health care needs will improve Outcome: Progressing Goal: Compliance with treatment plan for underlying cause of condition will improve Outcome: Progressing   Problem: Physical Regulation: Goal: Ability to maintain clinical measurements within normal limits will improve Outcome: Progressing   Problem: Safety: Goal: Periods of time without injury will increase Outcome: Progressing   Problem: Education: Goal: Knowledge of General Education information will improve Description: Including pain rating scale, medication(s)/side effects and non-pharmacologic comfort measures Outcome: Progressing   Problem: Health Behavior/Discharge Planning: Goal: Ability to manage health-related needs will improve Outcome: Progressing   Problem: Clinical Measurements: Goal: Ability to maintain clinical measurements within normal limits will improve Outcome: Progressing Goal: Will remain free from infection Outcome: Progressing Goal: Diagnostic test results will improve Outcome: Progressing Goal: Respiratory complications will improve Outcome: Progressing Goal: Cardiovascular complication will be  avoided Outcome: Progressing   Problem: Activity: Goal: Risk for activity intolerance will decrease Outcome: Progressing   Problem: Nutrition: Goal: Adequate nutrition will be maintained Outcome: Progressing   Problem: Coping: Goal: Level of anxiety will decrease Outcome: Progressing   Problem: Elimination: Goal: Will not experience complications related to bowel motility Outcome: Progressing Goal: Will not experience complications related to urinary retention Outcome: Progressing   Problem: Pain Managment: Goal: General experience of comfort will improve and/or be controlled Outcome: Progressing   Problem: Safety: Goal: Ability to remain free from injury will improve Outcome: Progressing   Problem: Skin Integrity: Goal: Risk for impaired skin integrity will decrease Outcome: Progressing
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
## 2024-05-24 NOTE — Group Note (Signed)
 Date:  05/24/2024 Time:  10:53 AM  Group Topic/Focus:  Building Self Esteem:   The Focus of this group is helping patients become aware of the effects of self-esteem on their lives, the things they and others do that enhance or undermine their self-esteem, seeing the relationship between their level of self-esteem and the choices they make and learning ways to enhance self-esteem.    Participation Level:  Did Not Attend   Harlene LITTIE Gavel 05/24/2024, 10:53 AM

## 2024-05-24 NOTE — Progress Notes (Signed)
   05/23/24 2100  Psych Admission Type (Psych Patients Only)  Admission Status Involuntary  Psychosocial Assessment  Patient Complaints Isolation  Eye Contact Brief  Facial Expression Flat  Affect Flat  Speech Logical/coherent  Interaction Isolative  Motor Activity Pacing  Appearance/Hygiene In scrubs  Behavior Characteristics Cooperative  Mood Labile  Thought Process  Coherency Disorganized  Content Preoccupation  Delusions None reported or observed  Perception Hallucinations  Hallucination Auditory  Judgment Impaired  Confusion Mild  Danger to Self  Current suicidal ideation? Denies  Self-Injurious Behavior No self-injurious ideation or behavior indicators observed or expressed   Agreement Not to Harm Self Yes  Description of Agreement verbal  Danger to Others  Danger to Others None reported or observed

## 2024-05-24 NOTE — Plan of Care (Signed)
   Problem: Education: Goal: Knowledge of [facility] Education information/materials will improve Outcome: Progressing Goal: Verbalization of understanding the information provided will improve Outcome: Progressing   Problem: Activity: Goal: Interest or engagement in activities will improve Outcome: Progressing

## 2024-05-25 NOTE — Group Note (Signed)
 [facility] LCSW Group Therapy Note   Group Date: 05/25/2024 Start Time: 1315 End Time: 1345   Type of Therapy/Topic:  Group Therapy:  Emotion Regulation  Participation Level:  Did Not Attend   Mood:  Description of Group:    The purpose of this group is to assist patients in learning to regulate negative emotions and experience positive emotions. Patients will be guided to discuss ways in which they have been vulnerable to their negative emotions. These vulnerabilities will be juxtaposed with experiences of positive emotions or situations, and patients challenged to use positive emotions to combat negative ones. Special emphasis will be placed on coping with negative emotions in conflict situations, and patients will process healthy conflict resolution skills.  Therapeutic Goals: Patient will identify two positive emotions or experiences to reflect on in order to balance out negative emotions:  Patient will label two or more emotions that they find the most difficult to experience:  Patient will be able to demonstrate positive conflict resolution skills through discussion or role plays:   Summary of Patient Progress:   X    Therapeutic Modalities:   Cognitive Behavioral Therapy Feelings Identification Dialectical Behavioral Therapy   [name], LCSWA

## 2024-05-25 NOTE — Progress Notes (Signed)
   05/25/24 0200  Psych Admission Type (Psych Patients Only)  Admission Status Involuntary  Psychosocial Assessment  Patient Complaints Isolation  Eye Contact Brief  Facial Expression Flat  Affect Flat  Speech Logical/coherent  Interaction Isolative  Motor Activity Pacing  Appearance/Hygiene In scrubs  Behavior Characteristics Cooperative  Mood Pleasant  Thought Process  Coherency Disorganized  Content Preoccupation  Delusions None reported or observed  Perception WDL  Hallucination None reported or observed  Judgment Impaired  Confusion Mild  Danger to Self  Current suicidal ideation? Denies  Description of Suicide Plan n/a  Self-Injurious Behavior No self-injurious ideation or behavior indicators observed or expressed   Agreement Not to Harm Self Yes  Description of Agreement verbal  Danger to Others  Danger to Others None reported or observed

## 2024-05-25 NOTE — Plan of Care (Signed)
  Problem: Activity: Goal: Sleeping patterns will improve Outcome: Progressing   Problem: Coping: Goal: Ability to demonstrate self-control will improve Outcome: Progressing   Problem: Coping: Goal: Level of anxiety will decrease Outcome: Progressing
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
## 2024-05-25 NOTE — Group Note (Signed)
 Date:  05/25/2024 Time:  11:19 AM  Group Topic/Focus:  MOVEMENT THERAPY    Participation Level:  Did Not Attend    [name] 05/25/2024, 11:19 AM
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
## 2024-05-26 NOTE — Group Note (Signed)
 Date:  05/26/2024 Time:  8:44 PM  Group Topic/Focus:  Wrap-Up Group:   The focus of this group is to help patients review their daily goal of treatment and discuss progress on daily workbooks.    Participation Level:  Did Not Attend  Participation Quality:    Affect:    Cognitive:    Insight: None  Engagement in Group:  None  Modes of Intervention:    Additional Comments:    [name] 05/26/2024, 8:44 PM
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
## 2024-05-26 NOTE — Group Note (Addendum)
 Physical/Occupational Therapy Group Note  Group Topic: UE Therex   Group Date: 05/26/2024 Start Time: 1305 End Time: 1330 Facilitators: Lavonda Therisa CROME, OT   Group Description: Group instructed in series of upper extremities exercises, aimed to promote strength, flexibility, range of motion and functional endurance.  Patients provided cuing for proper mechanics and proper pace of exercise; exercises adjusted as necessary for individualized patient needs.  Patient also engaged in cognitive components throughout session, working to integrate attention to task, command following, turn-taking and appropriate social interaction throughout session.  Allowed to ask questions as appropriate, and encouraged to identify specific exercises that they could complete independently outside of group sessions.   Therapeutic Goal(s): Demonstrate appropriate performance of upper extremity exercises to promote strength, flexibility, range of motion and functional endurance Identify 2-3 specific upper extremity exercises to complete as home exercise program outside of group session    Individual Participation: pt did not attend   Participation Level:    Participation Quality:    Behavior:    Speech/Thought Process:    Affect/Mood:    Insight:    Judgement:    Modes of Intervention:   Patient Response to Interventions:     Plan: Continue to engage patient in PT/OT groups 1 - 2x/week.  05/26/2024  Therisa CROME Lavonda, OT Therisa Lavonda, OTD OTR/L  05/26/24, 2:12 PM

## 2024-05-26 NOTE — Group Note (Signed)
 Date:  05/26/2024 Time:  1:16 PM  Group Topic/Focus:  Managing Feelings:   The focus of this group is to identify what feelings patients have difficulty handling and develop a plan to handle them in a healthier way upon discharge.    Participation Level:  Minimal  Participation Quality:  Appropriate and Attentive  Affect:  Appropriate and Flat  Cognitive:  Alert and Appropriate  Insight: Appropriate  Engagement in Group:  Limited  Modes of Intervention:  Activity and Discussion  Additional Comments:     Maglione,Madason Rauls E 05/26/2024, 1:16 PM

## 2024-05-26 NOTE — Plan of Care (Signed)
  Problem: Activity: Goal: Sleeping patterns will improve Outcome: Progressing   

## 2024-05-26 NOTE — [facility] Counselor (Signed)
 CSW contacted [facility] Johnson via email on 10/29 to inquire about who would be making legal decisions on pt's behalf given the death of pt's guardian   CSW received response but it was not clear who it would be making legal decisions at this time.   CSW reached back out that same day for clarification via email per provider request as provider wants to change pt's medication.   CSW did not receive response.   Provider reached out on 05/23/24 to ask permission for medication adjustments. Provider received no response.   CSW contacted Daphene via phone on 05/25/24 to get the clarification needed as well as permission for med administration per provider's request. CSW unable to reach, left HIPAA compliant VM requesting return call.   [name] Croft, MSW, [place] 05/26/2024 10:40 AM

## 2024-05-26 NOTE — Group Note (Signed)
 Recreation Therapy Group Note   Group Topic:Healthy Support Systems  Group Date: 05/26/2024 Start Time: 1400 End Time: 1450 Facilitators: Celestia Jeoffrey BRAVO, LRT, CTRS Location: Courtyard  Group Description: Emotional Check in. Patient sat and talked with LRT about how they are doing and whatever else is on their mind. LRT provided active listening, reassurance and encouragement. Pts were given the opportunity to listen to music or color mandalas while they talk.    Goal Area(s) Addressed: Patient will engage in conversation with LRT. Patient will communicate their wants, needs, or questions.  Patient will practice a new coping skill of "talking to someone".   Affect/Mood: [name]   Participation Level: Did not attend    Clinical Observations/Individualized Feedback: Patient did not attend group.   Plan: Continue to engage patient in RT group sessions 2-3x/week.   Jeoffrey BRAVO Celestia, LRT, CTRS 05/26/2024 4:30 PM

## 2024-05-26 NOTE — Progress Notes (Signed)
   05/26/24 1300  Psych Admission Type (Psych Patients Only)  Admission Status Involuntary  Psychosocial Assessment  Patient Complaints Isolation  Eye Contact Brief  Facial Expression Flat  Affect Flat  Speech Logical/coherent  Interaction Isolative  Motor Activity Pacing  Appearance/Hygiene In scrubs  Behavior Characteristics Cooperative  Mood Pleasant  Thought Process  Coherency Disorganized  Content Preoccupation  Delusions None reported or observed  Perception WDL  Hallucination None reported or observed  Judgment Impaired  Confusion Mild  Danger to Self  Current suicidal ideation? Denies  Agreement Not to Harm Self Yes  Description of Agreement verbal  Danger to Others  Danger to Others None reported or observed

## 2024-05-27 NOTE — Group Note (Signed)
 Date:  05/27/2024 Time:  9:21 PM  Group Topic/Focus:  Wrap-Up Group:   The focus of this group is to help patients review their daily goal of treatment and discuss progress on daily workbooks.    Participation Level:  Did Not Attend  Beatris ONEIDA Hasten 05/27/2024, 9:21 PM

## 2024-05-27 NOTE — Plan of Care (Signed)
  Problem: Activity: Goal: Sleeping patterns will improve Outcome: Progressing   Problem: Coping: Goal: Ability to verbalize frustrations and anger appropriately will improve Outcome: Progressing   

## 2024-05-27 NOTE — Group Note (Signed)
 [facility] LCSW Group Therapy Note   Group Date: 05/27/2024 Start Time: 1345 End Time: 1435   Type of Therapy/Topic:  Group Therapy:  Balance in Life  Participation Level:  Did Not Attend   Description of Group:    This group will address the concept of balance and how it feels and looks when one is unbalanced. Patients will be encouraged to process areas in their lives that are out of balance, and identify reasons for remaining unbalanced. Facilitators will guide patients utilizing problem- solving interventions to address and correct the stressor making their life unbalanced. Understanding and applying boundaries will be explored and addressed for obtaining  and maintaining a balanced life. Patients will be encouraged to explore ways to assertively make their unbalanced needs known to significant others in their lives, using other group members and facilitator for support and feedback.  Therapeutic Goals: Patient will identify two or more emotions or situations they have that consume much of in their lives. Patient will identify signs/triggers that life has become out of balance:  Patient will identify two ways to set boundaries in order to achieve balance in their lives:  Patient will demonstrate ability to communicate their needs through discussion and/or role plays  Summary of Patient Progress: Patient did not attend          Rexene LELON Mae, LCSWA

## 2024-05-27 NOTE — Progress Notes (Signed)
 Behavior:  Cooperative with VS and medications.   Psych assessment:  Denies SI.  Observed to be speaking to people not present. Pacing in room.  Interaction / Group attendance:  Present in the milieu for meals.  No interaction with peers. Minimal interaction with staff.   Medication/ PRNs: Compliant.  Pain: Denies.  15 min checks in place for safety.

## 2024-05-27 NOTE — Group Note (Signed)
 Date:  05/27/2024 Time:  12:41 PM  Group Topic/Focus:  Recovery Goals:   The focus of this group is to identify appropriate goals for recovery and establish a plan to achieve them.    Participation Level:  Minimal  Participation Quality:  Appropriate and Attentive  Affect:  Flat  Cognitive:  Alert and Appropriate  Insight: Appropriate  Engagement in Group:  Limited  Modes of Intervention:  Discussion  Additional Comments:     Maglione,Shoshanah Dapper E 05/27/2024, 12:41 PM

## 2024-05-27 NOTE — Plan of Care (Signed)
  Problem: Education: Goal: Mental status will improve Outcome: Not Progressing Goal: Verbalization of understanding the information provided will improve Outcome: Not Progressing   Problem: Activity: Goal: Interest or engagement in activities will improve Outcome: Not Progressing
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
## 2024-05-28 MED ORDER — OLANZAPINE 10 MG PO TABS
15.0000 mg | ORAL_TABLET | Freq: Two times a day (BID) | ORAL | Status: AC
  Administered 2024-05-29 – 2024-08-25 (×178): 15 mg via ORAL
  Filled 2024-05-28: qty 1.5
  Filled 2024-05-28 (×2): qty 2
  Filled 2024-05-28: qty 1.5
  Filled 2024-05-28 (×3): qty 3
  Filled 2024-05-28: qty 1.5
  Filled 2024-05-28: qty 2
  Filled 2024-05-28: qty 3
  Filled 2024-05-28: qty 1.5
  Filled 2024-05-28: qty 2
  Filled 2024-05-28: qty 3
  Filled 2024-05-28 (×2): qty 1.5
  Filled 2024-05-28: qty 2
  Filled 2024-05-28: qty 3
  Filled 2024-05-28: qty 1.5
  Filled 2024-05-28: qty 2
  Filled 2024-05-28: qty 3
  Filled 2024-05-28: qty 1.5
  Filled 2024-05-28 (×2): qty 3
  Filled 2024-05-28: qty 2
  Filled 2024-05-28 (×3): qty 1.5
  Filled 2024-05-28: qty 2
  Filled 2024-05-28: qty 3
  Filled 2024-05-28: qty 2
  Filled 2024-05-28 (×4): qty 1.5
  Filled 2024-05-28 (×2): qty 2
  Filled 2024-05-28: qty 1.5
  Filled 2024-05-28 (×2): qty 3
  Filled 2024-05-28 (×2): qty 1.5
  Filled 2024-05-28 (×2): qty 3
  Filled 2024-05-28: qty 2
  Filled 2024-05-28 (×2): qty 3
  Filled 2024-05-28: qty 2
  Filled 2024-05-28: qty 3
  Filled 2024-05-28: qty 1.5
  Filled 2024-05-28: qty 2
  Filled 2024-05-28 (×2): qty 1.5
  Filled 2024-05-28: qty 3
  Filled 2024-05-28 (×2): qty 2
  Filled 2024-05-28 (×2): qty 3
  Filled 2024-05-28: qty 2
  Filled 2024-05-28: qty 1.5
  Filled 2024-05-28: qty 2
  Filled 2024-05-28: qty 1.5
  Filled 2024-05-28: qty 2
  Filled 2024-05-28: qty 3
  Filled 2024-05-28: qty 2
  Filled 2024-05-28: qty 1.5
  Filled 2024-05-28: qty 3
  Filled 2024-05-28: qty 2
  Filled 2024-05-28: qty 3
  Filled 2024-05-28 (×2): qty 1.5
  Filled 2024-05-28: qty 3
  Filled 2024-05-28: qty 1.5
  Filled 2024-05-28 (×2): qty 2
  Filled 2024-05-28: qty 3
  Filled 2024-05-28: qty 2
  Filled 2024-05-28: qty 3
  Filled 2024-05-28: qty 2
  Filled 2024-05-28: qty 1.5
  Filled 2024-05-28: qty 3
  Filled 2024-05-28: qty 1.5
  Filled 2024-05-28 (×2): qty 3
  Filled 2024-05-28: qty 1.5
  Filled 2024-05-28: qty 3
  Filled 2024-05-28: qty 1.5
  Filled 2024-05-28 (×2): qty 2
  Filled 2024-05-28 (×2): qty 1.5
  Filled 2024-05-28: qty 2
  Filled 2024-05-28: qty 1.5
  Filled 2024-05-28: qty 3
  Filled 2024-05-28 (×2): qty 2
  Filled 2024-05-28: qty 1.5
  Filled 2024-05-28 (×2): qty 2
  Filled 2024-05-28: qty 3
  Filled 2024-05-28 (×3): qty 1.5
  Filled 2024-05-28: qty 2
  Filled 2024-05-28 (×2): qty 1.5
  Filled 2024-05-28: qty 2
  Filled 2024-05-28 (×4): qty 3
  Filled 2024-05-28: qty 2
  Filled 2024-05-28: qty 1.5
  Filled 2024-05-28: qty 3
  Filled 2024-05-28 (×2): qty 1.5
  Filled 2024-05-28: qty 2
  Filled 2024-05-28: qty 3
  Filled 2024-05-28 (×3): qty 1.5
  Filled 2024-05-28: qty 3
  Filled 2024-05-28 (×2): qty 2
  Filled 2024-05-28: qty 1.5
  Filled 2024-05-28: qty 2
  Filled 2024-05-28: qty 3
  Filled 2024-05-28 (×3): qty 1.5
  Filled 2024-05-28: qty 3
  Filled 2024-05-28 (×3): qty 1.5
  Filled 2024-05-28: qty 3
  Filled 2024-05-28: qty 2
  Filled 2024-05-28 (×2): qty 3
  Filled 2024-05-28 (×3): qty 1.5
  Filled 2024-05-28: qty 3
  Filled 2024-05-28: qty 2
  Filled 2024-05-28: qty 1.5
  Filled 2024-05-28: qty 3
  Filled 2024-05-28 (×2): qty 1.5
  Filled 2024-05-28 (×3): qty 2
  Filled 2024-05-28: qty 3
  Filled 2024-05-28: qty 2
  Filled 2024-05-28: qty 1.5
  Filled 2024-05-28: qty 2
  Filled 2024-05-28: qty 1.5
  Filled 2024-05-28: qty 3
  Filled 2024-05-28: qty 1.5
  Filled 2024-05-28: qty 2
  Filled 2024-05-28: qty 3
  Filled 2024-05-28: qty 1.5
  Filled 2024-05-28: qty 2
  Filled 2024-05-28 (×2): qty 1.5
  Filled 2024-05-28: qty 3
  Filled 2024-05-28: qty 1.5
  Filled 2024-05-28: qty 2
  Filled 2024-05-28 (×2): qty 3
  Filled 2024-05-28 (×2): qty 1.5
  Filled 2024-05-28 (×2): qty 3
  Filled 2024-05-28 (×3): qty 2
  Filled 2024-05-28 (×2): qty 3
  Filled 2024-05-28: qty 2
  Filled 2024-05-28: qty 1.5
  Filled 2024-05-28: qty 3
  Filled 2024-05-28: qty 1.5
  Filled 2024-05-28: qty 3
  Filled 2024-05-28: qty 1.5
  Filled 2024-05-28: qty 3
  Filled 2024-05-28 (×3): qty 1.5
  Filled 2024-05-28: qty 3
  Filled 2024-05-28: qty 2
  Filled 2024-05-28: qty 1.5
  Filled 2024-05-28: qty 3
  Filled 2024-05-28 (×2): qty 2
  Filled 2024-05-28 (×3): qty 3
  Filled 2024-05-28: qty 1.5
  Filled 2024-05-28: qty 3
  Filled 2024-05-28: qty 1.5
  Filled 2024-05-28: qty 2
  Filled 2024-05-28: qty 3
  Filled 2024-05-28: qty 1.5
  Filled 2024-05-28: qty 2
  Filled 2024-05-28: qty 3
  Filled 2024-05-28: qty 2
  Filled 2024-05-28: qty 3
  Filled 2024-05-28: qty 2
  Filled 2024-05-28 (×2): qty 3
  Filled 2024-05-28: qty 2
  Filled 2024-05-28 (×2): qty 1.5
  Filled 2024-05-28: qty 2
  Filled 2024-05-28 (×2): qty 3
  Filled 2024-05-28: qty 1.5
  Filled 2024-05-28 (×2): qty 3
  Filled 2024-05-28: qty 2
  Filled 2024-05-28 (×2): qty 3
  Filled 2024-05-28: qty 1.5
  Filled 2024-05-28 (×2): qty 2
  Filled 2024-05-28: qty 1.5
  Filled 2024-05-28: qty 2
  Filled 2024-05-28: qty 1.5
  Filled 2024-05-28 (×2): qty 3
  Filled 2024-05-28: qty 1.5
  Filled 2024-05-28 (×2): qty 3
  Filled 2024-05-28: qty 2
  Filled 2024-05-28: qty 3
  Filled 2024-05-28: qty 1.5
  Filled 2024-05-28: qty 2
  Filled 2024-05-28 (×2): qty 1.5
  Filled 2024-05-28 (×3): qty 3

## 2024-05-28 NOTE — Group Note (Signed)
 Date:  05/28/2024 Time:  12:17 PM  Group Topic/Focus:  Wellness Toolbox:   The focus of this group is to discuss various aspects of wellness, balancing those aspects and exploring ways to increase the ability to experience wellness.  Patients will create a wellness toolbox for use upon discharge.    Participation Level:  Did Not Attend   [name] 05/28/2024, 12:17 PM

## 2024-05-28 NOTE — Plan of Care (Signed)
   Problem: Education: Goal: Knowledge of [facility] Education information/materials will improve Outcome: Progressing Goal: Emotional status will improve Outcome: Progressing Goal: Mental status will improve Outcome: Progressing Goal: Verbalization of understanding the information provided will improve Outcome: Progressing

## 2024-05-28 NOTE — BH IP Treatment Plan (Signed)
 Interdisciplinary Treatment and Diagnostic Plan Update  05/28/2024 Time of Session: 12:30 PM  [name] MRN: [id number]  Principal Diagnosis: <principal problem not specified>  Secondary Diagnoses: Active Problems:   Schizophrenia, paranoid (HCC)   Current Medications:  Current Facility-Administered Medications  Medication Dose Route Frequency Provider Last Rate Last Admin   acetaminophen  (TYLENOL ) tablet 650 mg  650 mg Oral Q6H PRN Starkes-Perry, Takia S, FNP       alum & mag hydroxide-simeth (MAALOX/MYLANTA) 200-200-20 MG/5ML suspension 30 mL  30 mL Oral Q4H PRN Starkes-Perry, Takia S, FNP       HYDROcodone-acetaminophen  (NORCO/VICODIN) 5-325 MG per tablet 1-2 tablet  1-2 tablet Oral Q4H PRN Starkes-Perry, Takia S, FNP       magnesium  hydroxide (MILK OF MAGNESIA) suspension 30 mL  30 mL Oral Daily PRN Starkes-Perry, Takia S, FNP       multivitamin with minerals tablet 1 tablet  1 tablet Oral Daily Wilkie Majel RAMAN, FNP   1 tablet at 05/28/24 9093   OLANZapine  (ZYPREXA ) tablet 10 mg  10 mg Oral BID Jadapalle, Sree, MD   10 mg at 05/28/24 9093   Or   OLANZapine  (ZYPREXA ) injection 10 mg  10 mg Intramuscular BID Jadapalle, Sree, MD   10 mg at 05/17/24 0957   OLANZapine  (ZYPREXA ) injection 5 mg  5 mg Intramuscular TID PRN Starkes-Perry, Takia S, FNP       OLANZapine  zydis (ZYPREXA ) disintegrating tablet 5 mg  5 mg Oral TID PRN Starkes-Perry, Majel RAMAN, FNP       ondansetron  (ZOFRAN ) tablet 4 mg  4 mg Oral Q6H PRN Starkes-Perry, Majel RAMAN, FNP       Or   ondansetron  (ZOFRAN ) injection 4 mg  4 mg Intravenous Q6H PRN Starkes-Perry, Majel RAMAN, FNP       thiamine  (VITAMIN B1) tablet 100 mg  100 mg Oral Daily Wilkie Majel RAMAN, FNP   100 mg at 05/28/24 9092   traZODone  (DESYREL ) tablet 50 mg  50 mg Oral QHS Starkes-Perry, Takia S, FNP   50 mg at 05/27/24 2112   PTA Medications: Medications Prior to Admission  Medication Sig Dispense Refill Last Dose/Taking   feeding supplement (ENSURE  PLUS HIGH PROTEIN) LIQD Take 237 mLs by mouth 3 (three) times daily between meals.      Multiple Vitamin (MULTIVITAMIN WITH MINERALS) TABS tablet Take 1 tablet by mouth daily.      OLANZapine  (ZYPREXA ) 5 MG tablet Take 1 tablet (5 mg total) by mouth 2 (two) times daily.      OLANZapine  (ZYPREXA ) injection Inject 1 mL (5 mg total) into the muscle 2 (two) times daily.      thiamine  (VITAMIN B-1) 100 MG tablet Take 1 tablet (100 mg total) by mouth daily.      traZODone  (DESYREL ) 50 MG tablet Take 1 tablet (50 mg total) by mouth at bedtime.       Patient Stressors:    Patient Strengths:    Treatment Modalities: Medication Management, Group therapy, Case management,  1 to 1 session with clinician, Psychoeducation, Recreational therapy.   Physician Treatment Plan for Primary Diagnosis: <principal problem not specified> Long Term Goal(s): Improvement in symptoms so as ready for discharge   Short Term Goals: Ability to identify changes in lifestyle to reduce recurrence of condition will improve  Medication Management: Evaluate patient's response, side effects, and tolerance of medication regimen.  Therapeutic Interventions: 1 to 1 sessions, Unit Group sessions and Medication administration.  Evaluation of Outcomes: Not Progressing  Physician Treatment Plan for Secondary Diagnosis: Active Problems:   Schizophrenia, paranoid (HCC)  Long Term Goal(s): Improvement in symptoms so as ready for discharge   Short Term Goals: Ability to identify changes in lifestyle to reduce recurrence of condition will improve     Medication Management: Evaluate patient's response, side effects, and tolerance of medication regimen.  Therapeutic Interventions: 1 to 1 sessions, Unit Group sessions and Medication administration.  Evaluation of Outcomes: Not Progressing   RN Treatment Plan for Primary Diagnosis: <principal problem not specified> Long Term Goal(s): Knowledge of disease and therapeutic regimen  to maintain health will improve  Short Term Goals: Ability to remain free from injury will improve, Ability to verbalize frustration and anger appropriately will improve, Ability to demonstrate self-control, Ability to participate in decision making will improve, Ability to verbalize feelings will improve, Ability to disclose and discuss suicidal ideas, Ability to identify and develop effective coping behaviors will improve, and Compliance with prescribed medications will improve  Medication Management: RN will administer medications as ordered by provider, will assess and evaluate patient's response and provide education to patient for prescribed medication. RN will report any adverse and/or side effects to prescribing provider.  Therapeutic Interventions: 1 on 1 counseling sessions, Psychoeducation, Medication administration, Evaluate responses to treatment, Monitor vital signs and CBGs as ordered, Perform/monitor CIWA, COWS, AIMS and Fall Risk screenings as ordered, Perform wound care treatments as ordered.  Evaluation of Outcomes: Not Progressing   LCSW Treatment Plan for Primary Diagnosis: <principal problem not specified> Long Term Goal(s): Safe transition to appropriate next level of care at discharge, Engage patient in therapeutic group addressing interpersonal concerns.  Short Term Goals: Engage patient in aftercare planning with referrals and resources, Increase social support, Increase ability to appropriately verbalize feelings, Increase emotional regulation, Facilitate acceptance of mental health diagnosis and concerns, Facilitate patient progression through stages of change regarding substance use diagnoses and concerns, Identify triggers associated with mental health/substance abuse issues, and Increase skills for wellness and recovery  Therapeutic Interventions: Assess for all discharge needs, 1 to 1 time with Social worker, Explore available resources and support systems, Assess for  adequacy in community support network, Educate family and significant other(s) on suicide prevention, Complete Psychosocial Assessment, Interpersonal group therapy.  Evaluation of Outcomes: Not Progressing    Progress in Treatment: Attending groups: No. Participating in groups: No. Taking medication as prescribed: Yes. Toleration medication: Yes. Family/Significant other contact made: No, will contact:  legal guardian Patient understands diagnosis: No. Discussing patient identified problems/goals with staff: Yes. Medical problems stabilized or resolved: Yes. Denies suicidal/homicidal ideation: Yes. Issues/concerns per patient self-inventory: No. Other:    New problem(s) identified: No, Describe:  None identified. Update: 05/13/2024 No changes at this time. Update 05/18/2024:  No changes at this time. Update 05/23/2024:  No changes at this time. Update 05/28/2024:  No changes at this time.     New Short Term/Long Term Goal(s): elimination of symptoms of psychosis, medication management for mood stabilization; elimination of SI thoughts; development of comprehensive mental wellness plan. Update: 05/13/2024 No changes at this time.Update: 05/18/2024 No changes at this time. Update 05/23/2024:  No changes at this time. Update 05/28/2024:  No changes at this time.     Patient Goals:  I just need to leave, I don't need it hereUpdate: 05/13/2024 No changes at this time.Update: 05/18/2024 No changes at this time. Update 05/23/2024:  No changes at this time.Update 05/28/2024:  No changes at this time.     Discharge Plan or  Barriers: CSW will assist with appropriate discharge planning. Update: 05/13/2024 No changes at this time.Update: 05/18/2024 No changes at this time. Update 05/23/2024:  No changes at this time.Update 05/28/2024:  CSW unable to identify appropriate decision maker, as pt's legal guardian has passed and CSW has reached out to APS caseworker multiple times to get clarity on  protocol for enforcing medication until an appropriate party has been legal identified   Reason for Continuation of Hospitalization: Aggression Hallucinations Medication stabilization   Estimated Length of Stay: 1 to 7 days. Update: 05/13/2024 TBDUpdate: 05/18/2024 TBD Update 05/23/2024:  TBD Update 05/28/2024:  TBD  Last 3 [place] Suicide Severity Risk Score: Flowsheet Row Admission (Current) from 05/06/2024 in [facility] ED to Hosp-Admission (Discharged) from 05/02/2024 in [facility] ED from 04/30/2024 in [facility]  C-SSRS RISK CATEGORY No Risk No Risk No Risk    Last [facility] 2/9 Scores:     No data to display          Scribe for Treatment Team: [name] JONETTA Raynaldo ISRAEL 05/28/2024 3:36 PM

## 2024-05-28 NOTE — Progress Notes (Signed)
 Pt disoriented to situation. She denies SI/HI. Pt seen mumbling to self and responding to internal stimuli. She denies physical complaints at this time. She in minimal in interaction and isolative to her bedroom this shift. She is safe on the unit at this time with Q15 min safety checks in place.

## 2024-05-28 NOTE — Group Note (Signed)
 Date:  05/28/2024 Time:  9:11 PM  Group Topic/Focus:  Wrap-Up Group:   The focus of this group is to help patients review their daily goal of treatment and discuss progress on daily workbooks.    Participation Level:  Did Not Attend   [name] [facility] 05/28/2024, 9:11 PM

## 2024-05-28 NOTE — Plan of Care (Signed)
   Problem: Coping: Goal: Ability to verbalize frustrations and anger appropriately will improve Outcome: Progressing
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
## 2024-05-29 NOTE — Progress Notes (Signed)
   05/29/24 2000  Psych Admission Type (Psych Patients Only)  Admission Status Involuntary  Psychosocial Assessment  Patient Complaints None  Eye Contact Brief  Facial Expression Flat  Affect Flat  Speech Soft  Interaction Isolative  Motor Activity Slow  Appearance/Hygiene In scrubs  Behavior Characteristics Cooperative  Mood Sad  Thought Process  Coherency Blocking  Content Preoccupation  Delusions None reported or observed  Perception Hallucinations  Hallucination Auditory  Judgment Impaired  Confusion Mild  Danger to Self  Current suicidal ideation? Denies  Self-Injurious Behavior No self-injurious ideation or behavior indicators observed or expressed   Agreement Not to Harm Self Yes  Description of Agreement Verbal  Danger to Others  Danger to Others None reported or observed

## 2024-05-29 NOTE — Progress Notes (Signed)
   05/29/24 1000  Psych Admission Type (Psych Patients Only)  Admission Status Involuntary  Psychosocial Assessment  Patient Complaints None  Eye Contact Brief  Facial Expression Flat  Affect Flat  Speech Soft  Interaction Minimal  Motor Activity Slow  Appearance/Hygiene In scrubs  Behavior Characteristics Cooperative  Mood Sad  Thought Process  Coherency Blocking  Content Preoccupation  Delusions None reported or observed  Perception Hallucinations  Hallucination Auditory  Judgment Impaired  Confusion Mild  Danger to Self  Current suicidal ideation? Denies  Agreement Not to Harm Self Yes  Description of Agreement verbal  Danger to Others  Danger to Others None reported or observed

## 2024-05-29 NOTE — Plan of Care (Signed)
  Problem: Education: Goal: Knowledge of [facility] Education information/materials will improve Outcome: Progressing Goal: Emotional status will improve Outcome: Progressing Goal: Mental status will improve Outcome: Progressing Goal: Verbalization of understanding the information provided will improve Outcome: Progressing   Problem: Health Behavior/Discharge Planning: Goal: Identification of resources available to assist in meeting health care needs will improve Outcome: Progressing Goal: Compliance with treatment plan for underlying cause of condition will improve Outcome: Progressing   Problem: Nutrition: Goal: Adequate nutrition will be maintained Outcome: Progressing   Problem: Skin Integrity: Goal: Risk for impaired skin integrity will decrease Outcome: Progressing

## 2024-05-29 NOTE — Group Note (Signed)
 Recreation Therapy Group Note   Group Topic:Emotion Expression  Group Date: 05/29/2024 Start Time: 1500 End Time: 1605 Facilitators: Celestia Jeoffrey BRAVO, LRT, CTRS Location: Dayroom  Group Description: Positivity Collage. LRT and patients discussed the importance of having a positive mindset and being happy. Patients received magazines, safety scissors, a glue stick and a piece of paper. Pts were encouraged to find images or words in the magazines that showed "happiness" or positivity to them. Pt shared their collage with the group once they were finished. LRT and pts discussed how it can be difficult to always have a positive mindset, especially when they have mental health challenges.   Goal Area(s) Addressed:  Pt will identify things associate with positivity. Pt will reduce negative thinking. Pt will identify a new coping skill of thinking positive thoughts.   Affect/Mood: [name]   Participation Level: Did not attend    Clinical Observations/Individualized Feedback: Patient did not attend group.  Plan: Continue to engage patient in RT group sessions 2-3x/week.   Jeoffrey BRAVO Celestia, LRT, CTRS 05/29/2024 4:58 PM

## 2024-05-29 NOTE — Group Note (Signed)
 Date:  05/29/2024 Time:  10:52 AM  Group Topic/Focus:  Goals Group/Meditation Group:   The focus of this group is to help patients establish daily goals to achieve during treatment and discuss how the patient can incorporate goal setting into their daily lives to aide in recovery. Also patients listened to a 10 minute meditation video and reflected to themselves.     Participation Level:  Did Not Attend  [name] 05/29/2024, 10:52 AM
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
## 2024-05-29 NOTE — Progress Notes (Signed)
(  Sleep Hours) - 9.75 (Any PRNs that were needed, meds refused, or side effects to meds)- None (Any disturbances and when (visitation, over night)-None (Concerns raised by the patient)- None (SI/HI/AVH)- Denies

## 2024-05-29 NOTE — Plan of Care (Signed)
   Problem: Coping: Goal: Ability to verbalize frustrations and anger appropriately will improve Outcome: Progressing Goal: Ability to demonstrate self-control will improve Outcome: Progressing

## 2024-05-29 NOTE — Group Note (Signed)
 Physical/Occupational Therapy Group Note  Group Topic: Yoga  Group Date: 05/29/2024 Start Time: 1300 End Time: 1330 Facilitators: Angellina Ferdinand, Alm Hamilton, PT   Group Description: Group participated with series of yoga poses, designed to emphasize functional standing balance, core stability, generalized flexibility and overall posture.  Incorporated deep breathing techniques with poses, working to promote relaxation, mindfulness and focus with targeted activities.   Discussed benefits of yoga in improving mood and self-esteem, reducing stress and anxiety, and promoting functional strength and balance for each participant.  Discussed ways to integrate into each participant's daily routine.  Provided handout with written and pictorial descriptions of included yoga movements to be utilized as appropriate outside of group time.  Therapeutic Goal(s):  Demonstrate safe ability to participate with yoga poses during group activity. Identify one benefit of participation with yoga poses as part of each participant's exercise/movement routine. Identify 1-2 individual poses that participant feels most beneficial to his/her needs and that he/she can easily replicate outside of group.  Individual Participation: Did not attend  Participation Level:   Participation Quality:   Behavior:   Speech/Thought Process:   Affect/Mood:   Insight:   Judgement:   Modes of Intervention:   Patient Response to Interventions:    Plan: Continue to engage patient in PT/OT groups 1 - 2x/week.  CHARM Hamilton Bertin PT, DPT 05/29/24, 1:41 PM

## 2024-05-29 NOTE — Group Note (Signed)
 Recreation Therapy Group Note   Group Topic:Emotion Expression  Group Date: 05/29/2024 Start Time: 1500 End Time: 1605 Facilitators: Celestia Jeoffrey BRAVO, LRT, CTRS Location: Dayroom  Group Description: Painting a [employer] Services Operational Officer. Patients and LRT discuss what it means to be "at peace", what it feels like physically and mentally. Pts are given a canvas and watercolor paint to use and encouraged to draw their idea of a peaceful place. Pts and LRT discuss how they use this in their daily life post discharge. Pts are encouraged to take their canvas home with them as a reminder to find their peaceful place whenever they are feeling depressed, anxious, etc.    Goal Area(s) Addressed:  Patient will identify what it means to experience a "peaceful" emotion. Patient will identify a new coping skill.  Patient will express their emotions through art. Patients will increase communication by talking with LRT and peers while in group.   Affect/Mood: [name]   Participation Level: Did not attend    Clinical Observations/Individualized Feedback: Patient did not attend group.   Plan: Continue to engage patient in RT group sessions 2-3x/week.   Jeoffrey BRAVO Celestia, LRT, CTRS 05/29/2024 4:55 PM
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
## 2024-05-30 NOTE — Group Note (Signed)
 Date:  05/30/2024 Time:  9:22 PM  Group Topic/Focus:  Wrap-Up Group:   The focus of this group is to help patients review their daily goal of treatment and discuss progress on daily workbooks.    Participation Level:  Did Not Attend  Participation Quality:     Affect:     Cognitive:     Insight: None  Engagement in Group:  None  Modes of Intervention:     Additional Comments:    [name] 05/30/2024, 9:22 PM

## 2024-05-30 NOTE — Group Note (Signed)
 Date:  05/30/2024 Time:  10:54 AM  Group Topic/Focus:  Personal Choices and Values:   The focus of this group is to help patients assess and explore the importance of values in their lives, how their values affect their decisions, how they express their values and what opposes their expression.    Participation Level:  Did Not Attend   Harlene LITTIE Gavel 05/30/2024, 10:54 AM

## 2024-05-30 NOTE — Group Note (Signed)
 Recreation Therapy Group Note   Group Topic:Leisure Education  Group Date: 05/30/2024 Start Time: 1505 End Time: 1550 Facilitators: Celestia Jeoffrey BRAVO, LRT, CTRS Location: Dayroom  Group Description: Bingo. Patients played multiple rounds of bingo. LRT and pts discussed the definition of leisure, things they do in their free time outside of the hospital, and how bingo is also a leisure activity. Pts received soda as their bingo prize.   Goal Area(s) Addressed:  Patient will identify a current leisure interest.  Patient will learn the definition of "leisure". Patient will have the opportunity to try a new leisure activity. Patient will communicate with peers and LRT.   Affect/Mood: [name]   Participation Level: Did not attend    Clinical Observations/Individualized Feedback: Patient did not attend group.   Plan: Continue to engage patient in RT group sessions 2-3x/week.   Jeoffrey BRAVO Celestia, LRT, CTRS 05/30/2024 4:58 PM

## 2024-05-30 NOTE — Plan of Care (Signed)
   Problem: Activity: Goal: Interest or engagement in activities will improve Outcome: Progressing   Problem: Coping: Goal: Ability to verbalize frustrations and anger appropriately will improve Outcome: Progressing   Problem: Health Behavior/Discharge Planning: Goal: Identification of resources available to assist in meeting health care needs will improve Outcome: Progressing

## 2024-05-30 NOTE — Group Note (Signed)
 LCSW Group Therapy Note  Group Date: 05/30/2024 Start Time: 1330 End Time: 1400   Type of Therapy and Topic:  Group Therapy: Positive Affirmations  Participation Level:  Did Not Attend   Description of Group:   This group addressed positive affirmation towards self and others.  Patients went around the room and identified two positive things about themselves and two positive things about a peer in the room.  Patients reflected on how it felt to share something positive with others, to identify positive things about themselves, and to hear positive things from others/ Patients were encouraged to have a daily reflection of positive characteristics or circumstances.   Therapeutic Goals: Patients will verbalize two of their positive qualities Patients will demonstrate empathy for others by stating two positive qualities about a peer in the group Patients will verbalize their feelings when voicing positive self affirmations and when voicing positive affirmations of others Patients will discuss the potential positive impact on their wellness/recovery of focusing on positive traits of self and others.  Summary of Patient Progress:  X  Therapeutic Modalities:   Cognitive Behavioral Therapy Motivational Interviewing    [name], LCSWA 05/30/2024  2:11 PM

## 2024-05-30 NOTE — Group Note (Signed)
 Recreation Therapy Group Note   Group Topic:Relaxation  Group Date: 05/30/2024 Start Time: 1100 End Time: 1125 Facilitators: Celestia Jeoffrey BRAVO, LRT, CTRS Location: Dayroom  Group Description: Meditation. LRT and patients discussed what they know about meditation and mindfulness. LRT played a Deep Breathing Meditation exercise script for patients to follow along to. LRT and patients discussed how meditation and deep breathing can be used as a coping skill post--discharge to help manage symptoms of stress.   Goal Area(s) Addressed: Patient will practice using relaxation technique. Patient will identify a new coping skill.  Patient will follow multistep directions to reduce anxiety and stress.   Affect/Mood: [name]   Participation Level: Did not attend    Clinical Observations/Individualized Feedback: Patient did not attend group.   Plan: Continue to engage patient in RT group sessions 2-3x/week.   Jeoffrey BRAVO Celestia, LRT, CTRS 05/30/2024 2:04 PM

## 2024-05-30 NOTE — Plan of Care (Signed)
   Problem: Education: Goal: Emotional status will improve Outcome: Not Progressing Goal: Mental status will improve Outcome: Not Progressing   Problem: Activity: Goal: Interest or engagement in activities will improve Outcome: Not Progressing

## 2024-05-30 NOTE — Progress Notes (Signed)
   05/30/24 0900  Psych Admission Type (Psych Patients Only)  Admission Status Involuntary  Psychosocial Assessment  Patient Complaints None  Eye Contact Brief  Facial Expression Flat  Affect Flat  Speech Soft  Interaction Isolative  Motor Activity Slow  Appearance/Hygiene In scrubs  Behavior Characteristics Cooperative  Mood Anxious  Thought Process  Coherency Blocking  Content Preoccupation  Delusions None reported or observed  Perception Hallucinations  Hallucination Auditory  Judgment Impaired  Confusion Mild  Danger to Self  Current suicidal ideation? Denies  Agreement Not to Harm Self Yes  Description of Agreement verbal  Danger to Others  Danger to Others None reported or observed

## 2024-05-31 NOTE — Group Note (Signed)
 Date:  05/31/2024 Time:  3:55 PM  Group Topic/Focus:  Group Exercise for Seniors     Participation Level:  Did Not Attend  Participation Quality:  n/a  Affect:  n/a  Cognitive:  n/a  Insight: None  Engagement in Group:  n/a  Modes of Intervention:  n/a  Additional Comments:  did not attend  [name] E Quinnton Bury 05/31/2024, 3:55 PM

## 2024-05-31 NOTE — Group Note (Signed)
 Date:  05/31/2024 Time:  11:22 AM  Group Topic/Focus:  Crafts, Word Search    Participation Level:  Did Not Attend  Participation Quality:  n/a  Affect:  n/a  Cognitive:  n/a  Insight: None  Engagement in Group:  n/a  Modes of Intervention:  n/a  Additional Comments:  did not attend  Saddie Sandeen E Natha Guin 05/31/2024, 11:22 AM

## 2024-05-31 NOTE — Plan of Care (Signed)
   Problem: Education: Goal: Emotional status will improve Outcome: Progressing   Problem: Activity: Goal: Sleeping patterns will improve Outcome: Progressing   Problem: Coping: Goal: Ability to verbalize frustrations and anger appropriately will improve Outcome: Progressing
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
## 2024-05-31 NOTE — Plan of Care (Signed)
  Problem: Activity: Goal: Sleeping patterns will improve Outcome: Progressing   Problem: Coping: Goal: Ability to demonstrate self-control will improve Outcome: Progressing   Problem: Safety: Goal: Periods of time without injury will increase Outcome: Progressing   Problem: Clinical Measurements: Goal: Respiratory complications will improve Outcome: Progressing   Problem: Safety: Goal: Ability to remain free from injury will improve Outcome: Progressing

## 2024-05-31 NOTE — Progress Notes (Signed)
   05/31/24 0750  Psych Admission Type (Psych Patients Only)  Admission Status Involuntary  Psychosocial Assessment  Patient Complaints None  Eye Contact Brief  Facial Expression Flat  Affect Flat  Speech Soft  Interaction Isolative  Motor Activity Slow  Appearance/Hygiene In scrubs  Behavior Characteristics Cooperative  Mood Sad  Thought Process  Coherency Blocking  Content Preoccupation  Delusions None reported or observed  Perception Hallucinations  Hallucination Auditory  Judgment Impaired  Confusion Mild  Danger to Self  Current suicidal ideation? Denies  Agreement Not to Harm Self Yes  Description of Agreement verbal  Danger to Others  Danger to Others None reported or observed

## 2024-05-31 NOTE — Progress Notes (Signed)
   05/30/24 2300  Psych Admission Type (Psych Patients Only)  Admission Status Involuntary  Psychosocial Assessment  Patient Complaints None  Eye Contact Brief  Facial Expression Flat  Affect Flat  Speech Soft  Interaction Isolative  Motor Activity Slow  Appearance/Hygiene In scrubs  Behavior Characteristics Cooperative  Mood Sad  Thought Process  Coherency Blocking  Content Preoccupation  Delusions None reported or observed  Perception Hallucinations  Hallucination Auditory  Judgment Impaired  Confusion Mild  Danger to Self  Current suicidal ideation? Denies  Agreement Not to Harm Self Yes  Description of Agreement Verbalzed  Danger to Others  Danger to Others None reported or observed

## 2024-06-01 NOTE — Group Note (Signed)
 Date:  06/01/2024 Time:  2:19 PM  Group Topic/Focus:  Crisis Planning:   The purpose of this group is to help patients create a crisis plan for use upon discharge or in the future, as needed.    Participation Level:  Did Not Attend   [name] 06/01/2024, 2:19 PM
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
## 2024-06-01 NOTE — Group Note (Signed)
 Recreation Therapy Group Note   Group Topic:General Recreation  Group Date: 06/01/2024 Start Time: 1500 End Time: 1535 Facilitators: Celestia Jeoffrey BRAVO, LRT, CTRS Location: Courtyard  Group Description: Outdoor Recreation. Patients had the option to play corn hole, ring toss, UNO, or listening to music while outside in the courtyard getting fresh air and sunlight. Patients helped water  and prune the raised garden beds. LRT and patients discussed things that they enjoy doing in their free time outside of the hospital. LRT encouraged patients to drink water  after being active and getting their heart rate up.   Goal Area(s) Addressed: Patient will identify leisure interests.  Patient will practice healthy decision making. Patient will engage in recreation activity.   Affect/Mood: [name]   Participation Level: Did not attend    Clinical Observations/Individualized Feedback: Patient did not attend group.   Plan: Continue to engage patient in RT group sessions 2-3x/week.   Jeoffrey BRAVO Celestia, LRT, CTRS 06/01/2024 4:01 PM

## 2024-06-01 NOTE — Plan of Care (Signed)
  Problem: Coping: Goal: Ability to demonstrate self-control will improve Outcome: Progressing   Problem: Education: Goal: Mental status will improve Outcome: Not Progressing   Problem: Activity: Goal: Interest or engagement in activities will improve Outcome: Not Progressing

## 2024-06-01 NOTE — Progress Notes (Signed)
   05/31/24 2100  Psych Admission Type (Psych Patients Only)  Admission Status Involuntary  Psychosocial Assessment  Patient Complaints None  Eye Contact Brief  Facial Expression Flat  Affect Flat  Speech Soft  Interaction Isolative  Motor Activity Slow  Appearance/Hygiene In scrubs  Behavior Characteristics Cooperative  Mood Sad  Thought Process  Coherency Blocking  Content Preoccupation  Delusions None reported or observed  Perception Hallucinations  Hallucination Auditory  Judgment Impaired  Confusion Mild  Danger to Self  Current suicidal ideation? Denies  Agreement Not to Harm Self Yes  Description of Agreement Verbal  Danger to Others  Danger to Others None reported or observed

## 2024-06-01 NOTE — Plan of Care (Signed)
   Problem: Education: Goal: Knowledge of [facility] Education information/materials will improve Outcome: Progressing Goal: Emotional status will improve Outcome: Progressing Goal: Mental status will improve Outcome: Progressing Goal: Verbalization of understanding the information provided will improve Outcome: Progressing   Problem: Activity: Goal: Interest or engagement in activities will improve Outcome: Progressing Goal: Sleeping patterns will improve Outcome: Progressing   Problem: Coping: Goal: Ability to verbalize frustrations and anger appropriately will improve Outcome: Progressing Goal: Ability to demonstrate self-control will improve Outcome: Progressing   Problem: Health Behavior/Discharge Planning: Goal: Identification of resources available to assist in meeting health care needs will improve Outcome: Progressing Goal: Compliance with treatment plan for underlying cause of condition will improve Outcome: Progressing   Problem: Physical Regulation: Goal: Ability to maintain clinical measurements within normal limits will improve Outcome: Progressing   Problem: Safety: Goal: Periods of time without injury will increase Outcome: Progressing   Problem: Education: Goal: Knowledge of General Education information will improve Description: Including pain rating scale, medication(s)/side effects and non-pharmacologic comfort measures Outcome: Progressing   Problem: Health Behavior/Discharge Planning: Goal: Ability to manage health-related needs will improve Outcome: Progressing   Problem: Clinical Measurements: Goal: Ability to maintain clinical measurements within normal limits will improve Outcome: Progressing Goal: Will remain free from infection Outcome: Progressing Goal: Diagnostic test results will improve Outcome: Progressing Goal: Respiratory complications will improve Outcome: Progressing Goal: Cardiovascular complication will be  avoided Outcome: Progressing   Problem: Activity: Goal: Risk for activity intolerance will decrease Outcome: Progressing   Problem: Nutrition: Goal: Adequate nutrition will be maintained Outcome: Progressing   Problem: Coping: Goal: Level of anxiety will decrease Outcome: Progressing   Problem: Elimination: Goal: Will not experience complications related to bowel motility Outcome: Progressing Goal: Will not experience complications related to urinary retention Outcome: Progressing   Problem: Pain Managment: Goal: General experience of comfort will improve and/or be controlled Outcome: Progressing   Problem: Safety: Goal: Ability to remain free from injury will improve Outcome: Progressing   Problem: Skin Integrity: Goal: Risk for impaired skin integrity will decrease Outcome: Progressing

## 2024-06-01 NOTE — B[facility] Counselor (Signed)
 CSW attempted to contact Daphene Vicci [phone]), the [facility] DSS SW that is working with the patient.  CSW was unable to speak with the DSS SW and left a HIPAA compliant voicemail.   CSW reached out to [employer], as identified by [place], [place], [phone].  She reports that Daphene Vicci is not under her supervision and was informed to try [phone].  However Latreta asked CSW to not try that number but to call [facility] APS Supervisor, Donald Gaskins, [phone].  CSW attempted to contact Isaiah Gaskins and left HIPAA compliant voicemail.  Sherryle Margo, MSW, LCSW 06/01/2024 3:38 PM

## 2024-06-01 NOTE — Group Note (Signed)
 Recreation Therapy Group Note   Group Topic:Relaxation  Group Date: 06/01/2024 Start Time: 1100 End Time: 1130 Facilitators: Celestia Jeoffrey BRAVO, LRT, CTRS Location: Dayroom  Group Description: PMR (Progressive Muscle Relaxation). LRT educates patients on what PMR is and the benefits that come from it. Patients are asked to sit with their feet flat on the floor while sitting up and all the way back in their chair, if possible. LRT and pts follow a prompt through a speaker that requires you to tense and release different muscles in their body and focus on their breathing. During session, lights are off and soft music is being played. Pts are given a stress ball to use if needed.   Goal Area(s) Addressed:  Patients will be able to describe progressive muscle relaxation.  Patient will practice using relaxation technique. Patient will identify a new coping skill.  Patient will follow multistep directions to reduce anxiety and stress.   Affect/Mood: [name]   Participation Level: Did not attend    Clinical Observations/Individualized Feedback: Patient did not attend group.   Plan: Continue to engage patient in RT group sessions 2-3x/week.   Jeoffrey BRAVO Celestia, LRT, CTRS 06/01/2024 1:26 PM

## 2024-06-01 NOTE — Progress Notes (Signed)
 Behavior:  Cooperative with medications and VS.   Psych assessment:  Denies SI.  UTA further.  Interaction / Group attendance:  Isolates to room.  No interaction with peers.  Minimal interaction with staff.    Medication/ PRNs:  Compliant.  Pain: Denies.  15 min checks in place for safety.

## 2024-06-01 NOTE — Group Note (Signed)
 LCSW Group Therapy Note  Group Date: 06/01/2024 Start Time: 1330 End Time: 1400   Type of Therapy and Topic:  Group Therapy - Healthy vs Unhealthy Coping Skills  Participation Level:  Did Not Attend   Description of Group The focus of this group was to determine what unhealthy coping techniques typically are used by group members and what healthy coping techniques would be helpful in coping with various problems. Patients were guided in becoming aware of the differences between healthy and unhealthy coping techniques. Patients were asked to identify 2-3 healthy coping skills they would like to learn to use more effectively.  Therapeutic Goals Patients learned that coping is what human beings do all day long to deal with various situations in their lives Patients defined and discussed healthy vs unhealthy coping techniques Patients identified their preferred coping techniques and identified whether these were healthy or unhealthy Patients determined 2-3 healthy coping skills they would like to become more familiar with and use more often. Patients provided support and ideas to each other   Summary of Patient Progress:  X   Therapeutic Modalities Cognitive Behavioral Therapy Motivational Interviewing  [name], [place] 06/01/2024  2:17 PM
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
## 2024-06-02 NOTE — Plan of Care (Signed)
  Problem: Coping: Goal: Ability to demonstrate self-control will improve Outcome: Progressing   Problem: Health Behavior/Discharge Planning: Goal: Compliance with treatment plan for underlying cause of condition will improve Outcome: Progressing   Problem: Activity: Goal: Interest or engagement in activities will improve Outcome: Not Progressing

## 2024-06-02 NOTE — Group Note (Signed)
 Date:  06/02/2024 Time:  8:55 PM  Group Topic/Focus:  Coping With Mental Health Crisis:   The purpose of this group is to help patients identify strategies for coping with mental health crisis.  Group discusses possible causes of crisis and ways to manage them effectively.    Pt did not attend group.  [name] 06/02/2024, 8:55 PM

## 2024-06-02 NOTE — BH IP Treatment Plan (Signed)
 Interdisciplinary Treatment and Diagnostic Plan Update  06/02/2024 Time of Session: 3:00 PM  [name] MRN: [id number]  Principal Diagnosis: <principal problem not specified>  Secondary Diagnoses: Active Problems:   Schizophrenia, paranoid (HCC)   Current Medications:  Current Facility-Administered Medications  Medication Dose Route Frequency Provider Last Rate Last Admin   acetaminophen  (TYLENOL ) tablet 650 mg  650 mg Oral Q6H PRN Starkes-Perry, Takia S, FNP       alum & mag hydroxide-simeth (MAALOX/MYLANTA) 200-200-20 MG/5ML suspension 30 mL  30 mL Oral Q4H PRN Starkes-Perry, Takia S, FNP       HYDROcodone-acetaminophen  (NORCO/VICODIN) 5-325 MG per tablet 1-2 tablet  1-2 tablet Oral Q4H PRN Starkes-Perry, Takia S, FNP       magnesium  hydroxide (MILK OF MAGNESIA) suspension 30 mL  30 mL Oral Daily PRN Starkes-Perry, Takia S, FNP       multivitamin with minerals tablet 1 tablet  1 tablet Oral Daily Starkes-Perry, Takia S, FNP   1 tablet at 06/02/24 9091   OLANZapine  (ZYPREXA ) injection 5 mg  5 mg Intramuscular TID PRN Wilkie Majel RAMAN, FNP       OLANZapine  (ZYPREXA ) tablet 15 mg  15 mg Oral BID Jadapalle, Sree, MD   15 mg at 06/02/24 0911   OLANZapine  zydis (ZYPREXA ) disintegrating tablet 5 mg  5 mg Oral TID PRN Starkes-Perry, Takia S, FNP       ondansetron  (ZOFRAN ) tablet 4 mg  4 mg Oral Q6H PRN Starkes-Perry, Majel RAMAN, FNP       Or   ondansetron  (ZOFRAN ) injection 4 mg  4 mg Intravenous Q6H PRN Starkes-Perry, Majel RAMAN, FNP       thiamine  (VITAMIN B1) tablet 100 mg  100 mg Oral Daily Starkes-Perry, Takia S, FNP   100 mg at 06/02/24 9090   traZODone  (DESYREL ) tablet 50 mg  50 mg Oral QHS Starkes-Perry, Takia S, FNP   50 mg at 06/01/24 2149   PTA Medications: Medications Prior to Admission  Medication Sig Dispense Refill Last Dose/Taking   feeding supplement (ENSURE PLUS HIGH PROTEIN) LIQD Take 237 mLs by mouth 3 (three) times daily between meals.      Multiple Vitamin  (MULTIVITAMIN WITH MINERALS) TABS tablet Take 1 tablet by mouth daily.      OLANZapine  (ZYPREXA ) 5 MG tablet Take 1 tablet (5 mg total) by mouth 2 (two) times daily.      OLANZapine  (ZYPREXA ) injection Inject 1 mL (5 mg total) into the muscle 2 (two) times daily.      thiamine  (VITAMIN B-1) 100 MG tablet Take 1 tablet (100 mg total) by mouth daily.      traZODone  (DESYREL ) 50 MG tablet Take 1 tablet (50 mg total) by mouth at bedtime.       Patient Stressors:    Patient Strengths:    Treatment Modalities: Medication Management, Group therapy, Case management,  1 to 1 session with clinician, Psychoeducation, Recreational therapy.   Physician Treatment Plan for Primary Diagnosis: <principal problem not specified> Long Term Goal(s): Improvement in symptoms so as ready for discharge   Short Term Goals: Ability to identify changes in lifestyle to reduce recurrence of condition will improve  Medication Management: Evaluate patient's response, side effects, and tolerance of medication regimen.  Therapeutic Interventions: 1 to 1 sessions, Unit Group sessions and Medication administration.  Evaluation of Outcomes: Not Progressing  Physician Treatment Plan for Secondary Diagnosis: Active Problems:   Schizophrenia, paranoid (HCC)  Long Term Goal(s): Improvement in symptoms so as ready for discharge  Short Term Goals: Ability to identify changes in lifestyle to reduce recurrence of condition will improve     Medication Management: Evaluate patient's response, side effects, and tolerance of medication regimen.  Therapeutic Interventions: 1 to 1 sessions, Unit Group sessions and Medication administration.  Evaluation of Outcomes: Not Progressing   RN Treatment Plan for Primary Diagnosis: <principal problem not specified> Long Term Goal(s): Knowledge of disease and therapeutic regimen to maintain health will improve  Short Term Goals: Ability to remain free from injury will improve,  Ability to verbalize frustration and anger appropriately will improve, Ability to demonstrate self-control, Ability to participate in decision making will improve, Ability to verbalize feelings will improve, Ability to disclose and discuss suicidal ideas, Ability to identify and develop effective coping behaviors will improve, and Compliance with prescribed medications will improve  Medication Management: RN will administer medications as ordered by provider, will assess and evaluate patient's response and provide education to patient for prescribed medication. RN will report any adverse and/or side effects to prescribing provider.  Therapeutic Interventions: 1 on 1 counseling sessions, Psychoeducation, Medication administration, Evaluate responses to treatment, Monitor vital signs and CBGs as ordered, Perform/monitor CIWA, COWS, AIMS and Fall Risk screenings as ordered, Perform wound care treatments as ordered.  Evaluation of Outcomes: Not Progressing   LCSW Treatment Plan for Primary Diagnosis: <principal problem not specified> Long Term Goal(s): Safe transition to appropriate next level of care at discharge, Engage patient in therapeutic group addressing interpersonal concerns.  Short Term Goals: Engage patient in aftercare planning with referrals and resources, Increase social support, Increase ability to appropriately verbalize feelings, Increase emotional regulation, Facilitate acceptance of mental health diagnosis and concerns, Facilitate patient progression through stages of change regarding substance use diagnoses and concerns, Identify triggers associated with mental health/substance abuse issues, and Increase skills for wellness and recovery  Therapeutic Interventions: Assess for all discharge needs, 1 to 1 time with Social worker, Explore available resources and support systems, Assess for adequacy in community support network, Educate family and significant other(s) on suicide prevention,  Complete Psychosocial Assessment, Interpersonal group therapy.  Evaluation of Outcomes: Not Progressing   Progress in Treatment: Attending groups: No. Participating in groups: No. Taking medication as prescribed: Yes. Toleration medication: Yes. Family/Significant other contact made: No, will contact:  legal guardian Patient understands diagnosis: No. Discussing patient identified problems/goals with staff: Yes. Medical problems stabilized or resolved: Yes. Denies suicidal/homicidal ideation: Yes. Issues/concerns per patient self-inventory: No. Other:    New problem(s) identified: No, Describe:  None identified. Update: 05/13/2024 No changes at this time. Update 05/18/2024:  No changes at this time. Update 05/23/2024:  No changes at this time. Update 05/28/2024:  No changes at this time. Update 06/02/2024:  No changes at this time.       New Short Term/Long Term Goal(s): elimination of symptoms of psychosis, medication management for mood stabilization; elimination of SI thoughts; development of comprehensive mental wellness plan. Update: 05/13/2024 No changes at this time.Update: 05/18/2024 No changes at this time. Update 05/23/2024:  No changes at this time. Update 05/28/2024:  No changes at this time. Update 06/02/2024:  No changes at this time.     Patient Goals:  I just need to leave, I don't need it hereUpdate: 05/13/2024 No changes at this time.Update: 05/18/2024 No changes at this time. Update 05/23/2024:  No changes at this time.Update 05/28/2024:  No changes at this time. Update 06/02/2024:  No changes at this time.     Discharge Plan or Barriers: CSW  will assist with appropriate discharge planning. Update: 05/13/2024 No changes at this time.Update: 05/18/2024 No changes at this time. Update 05/23/2024:  No changes at this time.Update 05/28/2024:  CSW unable to identify appropriate decision maker, as pt's legal guardian has passed and CSW has reached out to APS caseworker  multiple times to get clarity on protocol for enforcing medication until an appropriate party has been legal identified Update 06/02/2024:  No changes at this time.   Reason for Continuation of Hospitalization: Aggression Hallucinations Medication stabilization   Estimated Length of Stay: 1 to 7 days. Update: 05/13/2024 TBDUpdate: 05/18/2024 TBD Update 05/23/2024:  TBD Update 05/28/2024:  TBDUpdate 06/02/2024:  TBD  Last 3 [place] Suicide Severity Risk Score: Flowsheet Row Admission (Current) from 05/06/2024 in [facility] ED to Hosp-Admission (Discharged) from 05/02/2024 in [place] [facility] ED from 04/30/2024 in [facility]  C-SSRS RISK CATEGORY No Risk No Risk No Risk    Last [facility] 2/9 Scores:     No data to display          Scribe for Treatment Team: [name], [place] 06/02/2024 1:28 PM

## 2024-06-02 NOTE — Group Note (Signed)
 Recreation Therapy Group Note   Group Topic:Emotion Expression  Group Date: 06/02/2024 Start Time: 1500 End Time: 1545 Facilitators: Celestia Jeoffrey BRAVO, LRT, CTRS Location: Courtyard  Group Description: Music. Patients are encouraged to name their favorite song(s) for LRT to play song through speaker for group to hear, while in the courtyard getting fresh air and sunlight. Patients educated on the definition of leisure and the importance of having different leisure interests outside of the hospital. Group discussed how leisure activities can often be used as pharmacologist and that listening to music and being outside are examples.    Goal Area(s) Addressed:  Patient will identify a current leisure interest.  Patient will practice making a positive decision. Patient will have the opportunity to try a new leisure activity.   Affect/Mood: [name]   Participation Level: Did not attend    Clinical Observations/Individualized Feedback: Patient did not attend group.   Plan: Continue to engage patient in RT group sessions 2-3x/week.   Jeoffrey BRAVO Celestia, LRT, CTRS 06/02/2024 5:11 PM

## 2024-06-02 NOTE — Progress Notes (Signed)
 Behavior:  Cooperative with medications and VS.    Psych assessment: Denies SI.  UTA further.  Interaction / Group attendance:  Present in the milieu only for meals.  No interaction with peers.  Minimal interaction with staff.  Does not attend groups.  Medication/ PRNs: Compliant.  Pain: Denies  15 min checks in place for safety.

## 2024-06-02 NOTE — Group Note (Signed)
 Date:  06/02/2024 Time:  10:25 AM  Group Topic/Focus:  Movement Therapy    Participation Level:  Did Not Attend    [name] Bias 06/02/2024, 10:25 AM

## 2024-06-02 NOTE — Group Note (Signed)
 Physical/Occupational Therapy Group Note  Group Topic: Pain Management and Coping   Group Date: 06/02/2024 Start Time: 1305 End Time: 1405 Facilitators: Clive Warren CROME, OT    Group Description:  Group discussed impact of chronic/acute pain on safety and independence with functional tasks and impact on mental health.  Identified and discussed any previously learned or implemented strategies used.  Discussed and reviewed cognitive behavioral pain coping strategies to address/improve overall management of pain. Discussed relaxation, distraction techniques, cognitive restructuring, activity pacing/energy conservation, environment/home safety modifications, and role of sleep and sleep hygiene. Allowed time for questions and further discussion.      Therapeutic Goal(s):   Identify and discuss previously utilized pain coping strategies and implications of pain on function/well-being Identify and discuss implementing new cognitive behavioral pain coping strategies into daily routines Demonstrate understanding and performance of learned cognitive behavioral pain coping strategies.    Individual Participation: Pt did not attend.  Participation Level: Did not attend   Participation Quality:   Behavior:   Speech/Thought Process:   Affect/Mood:   Insight:   Judgement:   Modes of Intervention:   Patient Response to Interventions:    Plan: Continue to engage patient in PT/OT groups 1 - 2x/week.  Krisandra Bueno R., MPH, MS, OTR/L ascom [phone] 06/02/24, 2:48 PM

## 2024-06-02 NOTE — Plan of Care (Signed)
  Problem: Activity: Goal: Interest or engagement in activities will improve Outcome: Not Progressing   Problem: Physical Regulation: Goal: Ability to maintain clinical measurements within normal limits will improve Outcome: Progressing   Problem: Safety: Goal: Periods of time without injury will increase Outcome: Progressing

## 2024-06-02 NOTE — Group Note (Signed)
 Recreation Therapy Group Note   Group Topic:Relaxation  Group Date: 06/02/2024 Start Time: 1055 End Time: 1120 Facilitators: Celestia Jeoffrey BRAVO, LRT, CTRS Location: Dayroom  Group Description: PMR (Progressive Muscle Relaxation). LRT educates patients on what PMR is and the benefits that come from it. Patients are asked to sit with their feet flat on the floor while sitting up and all the way back in their chair, if possible. LRT and pts follow a prompt through a speaker that requires you to tense and release different muscles in their body and focus on their breathing. During session, lights are off and soft music is being played. Pts are given a stress ball to use if needed.   Goal Area(s) Addressed:  Patients will be able to describe progressive muscle relaxation.  Patient will practice using relaxation technique. Patient will identify a new coping skill.  Patient will follow multistep directions to reduce anxiety and stress.    Affect/Mood: [name]   Participation Level: Did not attend    Clinical Observations/Individualized Feedback: Patient did not attend group.   Plan: Continue to engage patient in RT group sessions 2-3x/week.   Jeoffrey BRAVO Celestia, LRT, CTRS 06/02/2024 11:30 AM

## 2024-06-02 NOTE — Progress Notes (Signed)
   06/01/24 2002  Psychosocial Assessment  Patient Complaints None  Eye Contact Brief  Facial Expression Flat  Affect Flat  Speech Soft  Interaction Isolative  Motor Activity Slow  Appearance/Hygiene In scrubs  Behavior Characteristics Cooperative;Calm  Mood Pleasant;Preoccupied  Thought Process  Coherency Disorganized  Content Preoccupation  Delusions None reported or observed  Perception WDL  Hallucination None reported or observed  Judgment Impaired  Confusion Mild  Danger to Self  Current suicidal ideation? Denies  Agreement Not to Harm Self Yes  Description of Agreement Verbal  Danger to Others  Danger to Others None reported or observed

## 2024-06-03 NOTE — Group Note (Signed)
 Date:  06/03/2024 Time:  3:24 PM  Group Topic/Focus:  Fresh air Therapy with cool music and conversation    Participation Level:  Did Not Attend    [name] 06/03/2024, 3:24 PM

## 2024-06-03 NOTE — Progress Notes (Signed)
   06/02/24 2323  Psych Admission Type (Psych Patients Only)  Admission Status Involuntary  Psychosocial Assessment  Patient Complaints None  Eye Contact Avoids  Facial Expression Flat  Affect Flat  Speech Soft  Interaction Minimal;Isolative;Guarded  Motor Activity Restless  Appearance/Hygiene In scrubs  Behavior Characteristics Cooperative  Mood Preoccupied;Pleasant  Thought Process  Coherency Disorganized  Content Preoccupation  Delusions None reported or observed  Perception Hallucinations  Hallucination Auditory  Judgment Poor  Confusion Mild  Danger to Self  Current suicidal ideation? Denies  Description of Suicide Plan n/a  Self-Injurious Behavior No self-injurious ideation or behavior indicators observed or expressed   Agreement Not to Harm Self Yes  Description of Agreement verbal  Danger to Others  Danger to Others None reported or observed     Estimated Sleeping Duration (Last 24 Hours): 10.75-13.25 hours (Due to Daylight Saving Time, the durations displayed may not accurately represent documentation during the time change interval)   (Sleep Hours) - 10.75 - 13.25 hrs (Any PRNs that were needed, meds refused, or side effects to meds)- Medication compliant (Any disturbances and when (visitation, over night)- None noted (Concerns raised by the patient)- None (SI/HI/AVH)- Denied all, Remains withdrawn to self/ room throughout the shift.

## 2024-06-03 NOTE — Progress Notes (Signed)
   06/03/24 1400  Psych Admission Type (Psych Patients Only)  Admission Status Involuntary  Psychosocial Assessment  Patient Complaints None  Eye Contact Brief  Facial Expression Flat  Affect Flat  Speech Soft  Interaction Isolative  Motor Activity Slow  Appearance/Hygiene In scrubs  Behavior Characteristics Cooperative  Mood Pleasant  Thought Process  Coherency Disorganized  Content Preoccupation  Delusions None reported or observed  Perception Hallucinations  Hallucination Auditory  Judgment Poor  Confusion Mild  Danger to Self  Current suicidal ideation? Denies  Agreement Not to Harm Self Yes  Description of Agreement verbal  Danger to Others  Danger to Others None reported or observed

## 2024-06-03 NOTE — Group Note (Signed)
 [facility] LCSW Group Therapy Note   Group Date: 06/03/2024 Start Time: 1030 End Time: 1115   Type of Therapy/Topic:  Group Therapy:  Balance in Life  Participation Level:  Did Not Attend   Description of Group:    This group will address the concept of balance and how it feels and looks when one is unbalanced. Patients will be encouraged to process areas in their lives that are out of balance, and identify reasons for remaining unbalanced. Facilitators will guide patients utilizing problem- solving interventions to address and correct the stressor making their life unbalanced. Understanding and applying boundaries will be explored and addressed for obtaining  and maintaining a balanced life. Patients will be encouraged to explore ways to assertively make their unbalanced needs known to significant others in their lives, using other group members and facilitator for support and feedback.  Therapeutic Goals: Patient will identify two or more emotions or situations they have that consume much of in their lives. Patient will identify signs/triggers that life has become out of balance:  Patient will identify two ways to set boundaries in order to achieve balance in their lives:  Patient will demonstrate ability to communicate their needs through discussion and/or role plays  Summary of Patient Progress: Patient did not attend group       Therapeutic Modalities:   Cognitive Behavioral Therapy Solution-Focused Therapy Assertiveness Training   Rexene LELON Mae, LCSWA

## 2024-06-03 NOTE — Plan of Care (Signed)
   Problem: Activity: Goal: Interest or engagement in activities will improve Outcome: Progressing Goal: Sleeping patterns will improve Outcome: Progressing

## 2024-06-03 NOTE — Group Note (Signed)
 Date:  06/03/2024 Time:  5:11 PM  Group Topic/Focus:  Overcoming Stress:   The focus of this group is to define stress and help patients assess their triggers.    Participation Level:  Did Not Attend  Participation Quality:  Did not Attend  Affect:  Did not attend  Cognitive:  Did not attend  Insight: None  Engagement in Group:  None  Modes of Intervention:  none, did not attend  Additional Comments:    Demetry Bendickson S.,RN 06/03/2024, 5:11 PM

## 2024-06-03 NOTE — Group Note (Signed)
 Date:  06/03/2024 Time:  10:50 AM  Group Topic/Focus:  Coping With Mental Health Crisis:   The purpose of this group is to help patients identify strategies for coping with mental health crisis.  Group discusses possible causes of crisis and ways to manage them effectively.    Participation Level:  Did Not Attend  [name] 06/03/2024, 10:50 AM

## 2024-06-03 NOTE — Plan of Care (Signed)
 ?  Problem: Coping: ?Goal: Level of anxiety will decrease ?Outcome: Progressing ?  ?Problem: Safety: ?Goal: Ability to remain free from injury will improve ?Outcome: Progressing ?  ?
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
## 2024-06-03 NOTE — Group Note (Signed)
 Date:  06/03/2024 Time:  8:32 PM  Group Topic/Focus:  Identifying Needs:   The focus of this group is to help patients identify their personal needs that have been historically problematic and identify healthy behaviors to address their needs.    Pt did not attend group.  Oaklie Durrett L 06/03/2024, 8:32 PM

## 2024-06-04 NOTE — Progress Notes (Signed)
   06/03/24 2326  Psych Admission Type (Psych Patients Only)  Admission Status Involuntary  Psychosocial Assessment  Patient Complaints None  Eye Contact Brief  Facial Expression Flat  Affect Flat  Speech Soft  Interaction Isolative  Motor Activity Slow  Appearance/Hygiene In scrubs  Behavior Characteristics Cooperative;Appropriate to situation  Mood Pleasant  Thought Process  Coherency Disorganized  Content Preoccupation  Delusions None reported or observed  Perception Hallucinations  Hallucination Auditory  Judgment Poor  Confusion Mild  Danger to Self  Current suicidal ideation? Denies  Description of Suicide Plan n/a  Self-Injurious Behavior No self-injurious ideation or behavior indicators observed or expressed   Agreement Not to Harm Self Yes  Description of Agreement verbal  Danger to Others  Danger to Others None reported or observed    Estimated Sleeping Duration (Last 24 Hours): 10.75-12.00 hours (Due to Daylight Saving Time, the durations displayed may not accurately represent documentation during the time change interval)

## 2024-06-04 NOTE — Plan of Care (Signed)
  Problem: Education: Goal: Mental status will improve Outcome: Progressing   

## 2024-06-04 NOTE — Group Note (Signed)
 Date:  06/04/2024 Time:  7:04 PM  Group Topic/Focus:  Coping With Mental Health Crisis:   The purpose of this group is to help patients identify strategies for coping with mental health crisis.  Group discusses possible causes of crisis and ways to manage them effectively. Healthy Communication:   The focus of this group is to discuss communication, barriers to communication, as well as healthy ways to communicate with others. Wellness Toolbox:   The focus of this group is to discuss various aspects of wellness, balancing those aspects and exploring ways to increase the ability to experience wellness.  Patients will create a wellness toolbox for use upon discharge.    Participation Level:  Did Not Attend  Participation Quality:    Affect:   Cognitive:    Insight:   Engagement in Group:    Modes of Intervention:    Additional Comments:    Cadi Rhinehart L Coburn Knaus 06/04/2024, 7:04 PM

## 2024-06-04 NOTE — Group Note (Signed)
 Date:  06/04/2024 Time:  10:42 AM  Group Topic/Focus:  Getting fit with Derenda Giddings    Participation Level:  Did Not Attend    [name] Bias 06/04/2024, 10:42 AM

## 2024-06-04 NOTE — Progress Notes (Signed)
 Phone call back [facility] MD Progress Note  06/04/2024 12:47 PM [name]  MRN:  [id number]  [name] is a 63 y.o. female admitted: Medicallyfor 05/02/2024 10:56 AM for dehydration, failure to thrive and hypoglycemia. She carries the psychiatric diagnoses of Schizophrenia and has medical hx of hypoglycemia and failure to thrive and has a past medical history of sinus bradycardia, chronic anemia, severe malnutrition and recurrent hypoglycemia.. Her current presentation of verbal aggression, agitation and refusal to eat with severe paranoia is most consistent with paranoid schizophrenia. [name]  She was at [facility] where she was admitted on 04/30/2024.  She was transferred to the ED because of failure to thrive and was admitted to medical floor.Patient is on IVC and patient has been adamantly refusing all oral medications and refusing to eat.  Patient is admitted to [facility] unit with Q15 min safety monitoring. Multidisciplinary team approach is offered. Medication management; group/milieu therapy is offered.    Patient continues to have challenges with  psychotic behavior and no insight and does not want to engage meaningfully today wants to go home.  Subjective:  Chart reviewed, case discussed in multidisciplinary meeting, patient seen during rounds.       In spite of multiple attempts to reach out to DSS the caseworker has not responded to emails or phone calls.  Today this provider reached out to the director of [facility] and the call is returned and the DSS case worker informed the provider and apologize for the delay in communication.  She updated the team that emergency guardianship will be pursued on Monday with an answer within 2 days so that decisions can be made regarding the treatment.  Provider updated the DSS that brother is not yet the legal guardian but is willing to pursue the guardianship.  At this time patient has no legal guardian after the death of her previous legal guardian her  sister.  Past Psychiatric History: see h&P Family History: History reviewed. No pertinent family history. Social History:  Social History   Substance and Sexual Activity  Alcohol Use Not Currently   Comment: Refuses to disclose how much     Social History   Substance and Sexual Activity  Drug Use No   Comment: Refuses to answer    Social History   Socioeconomic History   Marital status: Single    Spouse name: Not on file   Number of children: Not on file   Years of education: Not on file   Highest education level: Not on file  Occupational History   Not on file  Tobacco Use   Smoking status: Every Day    Current packs/day: 2.00    Types: Cigarettes    Passive exposure: Current   Smokeless tobacco: Never  Vaping Use   Vaping status: Never Used  Substance and Sexual Activity   Alcohol use: Not Currently    Comment: Refuses to disclose how much   Drug use: No    Comment: Refuses to answer   Sexual activity: Not Currently    Comment: refused to answer  Other Topics Concern   Not on file  Social History Narrative   Not on file   Social Drivers of Health   Financial Resource Strain: Not on file  Food Insecurity: Patient Declined (05/06/2024)   Hunger Vital Sign    Worried About Running Out of Food in the Last Year: Patient declined    Ran Out of Food in the Last Year: Patient declined  Transportation  Needs: Patient Declined (05/06/2024)   PRAPARE - Administrator, Civil Service (Medical): Patient declined    Lack of Transportation (Non-Medical): Patient declined  Physical Activity: Not on file  Stress: Not on file  Social Connections: Not on file   Past Medical History:  Past Medical History:  Diagnosis Date   Anemia 10/02/2021   Non compliance w medication regimen    Schizophrenia (HCC)     Past Surgical History:  Procedure Laterality Date   BIOPSY  09/25/2021   Procedure: BIOPSY;  Surgeon: Teressa Toribio SQUIBB, MD;  Location: [facility];   Service: Gastroenterology;;   ESOPHAGOGASTRODUODENOSCOPY (EGD) WITH PROPOFOL  [name] 09/25/2021   Procedure: ESOPHAGOGASTRODUODENOSCOPY (EGD) WITH PROPOFOL ;  Surgeon: Teressa Toribio SQUIBB, MD;  Location: [facility];  Service: Gastroenterology;  Laterality: [name];  With NGT placement    Current Medications: Current Facility-Administered Medications  Medication Dose Route Frequency Provider Last Rate Last Admin   acetaminophen  (TYLENOL ) tablet 650 mg  650 mg Oral Q6H PRN Starkes-Perry, Takia S, FNP       alum & mag hydroxide-simeth (MAALOX/MYLANTA) 200-200-20 MG/5ML suspension 30 mL  30 mL Oral Q4H PRN Starkes-Perry, Takia S, FNP       HYDROcodone-acetaminophen  (NORCO/VICODIN) 5-325 MG per tablet 1-2 tablet  1-2 tablet Oral Q4H PRN Starkes-Perry, Takia S, FNP       magnesium  hydroxide (MILK OF MAGNESIA) suspension 30 mL  30 mL Oral Daily PRN Starkes-Perry, Takia S, FNP       multivitamin with minerals tablet 1 tablet  1 tablet Oral Daily Wilkie Majel RAMAN, FNP   1 tablet at 06/04/24 9083   OLANZapine  (ZYPREXA ) injection 5 mg  5 mg Intramuscular TID PRN Wilkie Majel RAMAN, FNP       OLANZapine  (ZYPREXA ) tablet 15 mg  15 mg Oral BID Jadapalle, Sree, MD   15 mg at 06/04/24 9083   OLANZapine  zydis (ZYPREXA ) disintegrating tablet 5 mg  5 mg Oral TID PRN Wilkie Majel RAMAN, FNP       ondansetron  (ZOFRAN ) tablet 4 mg  4 mg Oral Q6H PRN Starkes-Perry, Majel RAMAN, FNP       Or   ondansetron  (ZOFRAN ) injection 4 mg  4 mg Intravenous Q6H PRN Starkes-Perry, Majel RAMAN, FNP       thiamine  (VITAMIN B1) tablet 100 mg  100 mg Oral Daily Starkes-Perry, Takia S, FNP   100 mg at 06/04/24 0916   traZODone  (DESYREL ) tablet 50 mg  50 mg Oral QHS Starkes-Perry, Takia S, FNP   50 mg at 06/03/24 2104    Lab Results:  No results found for this or any previous visit (from the past 48 hours).   Blood Alcohol level:  Lab Results  Component Value Date   [facility] <15 05/02/2024   ETH <15 05/01/2024    Metabolic Disorder  Labs: Lab Results  Component Value Date   HGBA1C 5.6 05/14/2024   MPG 114.02 05/14/2024   MPG 116.89 05/02/2024   Lab Results  Component Value Date   PROLACTIN 13.6 04/16/2015   Lab Results  Component Value Date   CHOL 219 (H) 05/14/2024   TRIG 84 05/14/2024   HDL 83 05/14/2024   CHOLHDL 2.6 05/14/2024   VLDL 17 05/14/2024   LDLCALC 119 (H) 05/14/2024   LDLCALC 130 (H) 05/01/2024    Physical Findings: AIMS:  , ,  ,  ,    CIWA:    COWS:      Psychiatric Specialty Exam:  Presentation  General Appearance:  Casual  Eye Contact: Fair  Speech: Pressured  Speech Volume: Increased    Mood and Affect  Mood: Angry  Affect: Congruent   Thought Process  Thought Processes: Disorganized  Descriptions of Associations:Tangential  Orientation:Partial  Thought Content:Illogical; Tangential  Hallucinations:RESPONDING TO INTERNAL STIMULI Ideas of Reference:Paranoia; Delusions; Percusatory  Suicidal Thoughts:not endorsing Homicidal Thoughts:nor endorsing  Sensorium  Memory: Recent Poor  Judgment: Poor  Insight: Poor   Executive Functions  Concentration: Poor  Attention Span: Poor  Recall: Poor  Fund of Knowledge: Poor  Language: Poor   Psychomotor Activity  Psychomotor Activity:No data recorded Musculoskeletal: Strength & Muscle Tone: within normal limits Gait & Station: normal Assets  Assets: Resilience    Physical Exam: Physical Exam Vitals and nursing note reviewed.    ROS Blood pressure 111/65, pulse 63, temperature 98.1 F (36.7 C), resp. rate 18, height 5' 4 (1.626 m), weight 44.9 kg, SpO2 98%. Body mass index is 16.99 kg/m.  Diagnosis: Active Problems:   Schizophrenia, paranoid (HCC) Paranoid  PLAN: Safety and Monitoring:  -- Involuntary admission to inpatient psychiatric unit for safety, stabilization and treatment  -- Daily contact with patient to assess and evaluate symptoms and progress in  treatment  -- Patient's case to be discussed in multi-disciplinary team meeting  -- Observation Level : q15 minute checks  -- Vital signs:  q12 hours  -- Precautions: suicide, elopement, and assault -- Encouraged patient to participate in unit milieu and in scheduled group therapies  2. Psychiatric Diagnoses and Treatment: Patient  2 physicians approved non emergent forced medications-Dr.Vanetta Rule and Dr.Jadapalle  Zyprexa  to 10 mg BID oral or Im -non emergent forced medication-patient is taking her oral medication Will add second antipsychotic haldol   to help with the residual psychotic symptoms like Prolixin  once consent is received.-Waiting to hear from DSS caseworker for consent as brother already consented . Discharge Planning:   -- Social work and case management to assist with discharge planning and identification of hospital follow-up needs prior to discharge  -- Estimated LOS: 3-4 days  Millie JONELLE Manners, MD 06/04/2024, 12:47 PM

## 2024-06-04 NOTE — Group Note (Signed)
 [facility] LCSW Group Therapy Note   Group Date: 06/04/2024 Start Time: 1100 End Time: 1135   Type of Therapy/Topic:  Group Therapy:  Emotion Regulation  Participation Level:  Did Not Attend   Mood:  Description of Group:    The purpose of this group is to assist patients in learning to regulate negative emotions and experience positive emotions. Patients will be guided to discuss ways in which they have been vulnerable to their negative emotions. These vulnerabilities will be juxtaposed with experiences of positive emotions or situations, and patients challenged to use positive emotions to combat negative ones. Special emphasis will be placed on coping with negative emotions in conflict situations, and patients will process healthy conflict resolution skills.  Therapeutic Goals: Patient will identify two positive emotions or experiences to reflect on in order to balance out negative emotions:  Patient will label two or more emotions that they find the most difficult to experience:  Patient will be able to demonstrate positive conflict resolution skills through discussion or role plays:   Summary of Patient Progress:   Pt did not attend group.    Therapeutic Modalities:   Cognitive Behavioral Therapy Feelings Identification Dialectical Behavioral Therapy   Rexene LELON Mae, LCSWA

## 2024-06-04 NOTE — Progress Notes (Signed)
 Patient is and involuntary admission to [facility] for paranoid schizophrenia currently awaiting disposition with APS.  She mostly isolates in her room coming out for meals. Takes her medications with no problems and is polite to staff and peers.  Denies SI, HI, AVH, anxiety and depression. Ambulates with no assistance and completes her own ADL's. Will continue to monitor.

## 2024-06-05 NOTE — Plan of Care (Signed)
  Problem: Coping: Goal: Ability to demonstrate self-control will improve Outcome: Progressing   Problem: Coping: Goal: Level of anxiety will decrease Outcome: Progressing

## 2024-06-05 NOTE — Progress Notes (Signed)
   06/05/24 1200  Psych Admission Type (Psych Patients Only)  Admission Status Involuntary  Psychosocial Assessment  Patient Complaints None  Eye Contact Brief  Facial Expression Flat  Affect Flat  Speech Soft  Interaction Isolative  Motor Activity Slow  Appearance/Hygiene In scrubs  Behavior Characteristics Cooperative  Mood Pleasant  Thought Process  Coherency Disorganized  Content Preoccupation  Delusions None reported or observed  Perception WDL  Hallucination None reported or observed  Judgment Impaired  Confusion Mild  Danger to Self  Current suicidal ideation? Denies  Agreement Not to Harm Self Yes  Description of Agreement verbal  Danger to Others  Danger to Others None reported or observed

## 2024-06-05 NOTE — Progress Notes (Signed)
   06/05/24 2100  Psych Admission Type (Psych Patients Only)  Admission Status Involuntary  Psychosocial Assessment  Patient Complaints None  Eye Contact Brief  Facial Expression Flat  Affect Flat  Speech Soft  Interaction Isolative  Motor Activity Slow  Appearance/Hygiene In scrubs  Behavior Characteristics Cooperative  Mood Pleasant  Thought Process  Coherency Disorganized  Content Preoccupation  Delusions None reported or observed  Perception WDL  Hallucination None reported or observed  Judgment Impaired  Confusion Mild  Danger to Self  Current suicidal ideation? Denies  Self-Injurious Behavior No self-injurious ideation or behavior indicators observed or expressed   Agreement Not to Harm Self Yes  Description of Agreement Verbal  Danger to Others  Danger to Others None reported or observed

## 2024-06-05 NOTE — Group Note (Signed)
 Physical/Occupational Therapy Group Note  Group Topic: Yoga  Group Date: 06/05/2024 Start Time: 1300 End Time: 1325 Facilitators: Jolena Kittle, Alm Hamilton, PT   Group Description: Group participated with series of yoga poses, designed to emphasize functional standing balance, core stability, generalized flexibility and overall posture.  Incorporated deep breathing techniques with poses, working to promote relaxation, mindfulness and focus with targeted activities.   Discussed benefits of yoga in improving mood and self-esteem, reducing stress and anxiety, and promoting functional strength and balance for each participant.  Discussed ways to integrate into each participant's daily routine.  Provided handout with written and pictorial descriptions of included yoga movements to be utilized as appropriate outside of group time.  Therapeutic Goal(s):  Demonstrate safe ability to participate with yoga poses during group activity. Identify one benefit of participation with yoga poses as part of each participant's exercise/movement routine. Identify 1-2 individual poses that participant feels most beneficial to his/her needs and that he/she can easily replicate outside of group.  Individual Participation: Did not attend  Participation Level:   Participation Quality:   Behavior:   Speech/Thought Process:   Affect/Mood:   Insight:   Judgement:   Modes of Intervention:   Patient Response to Interventions:    Plan: Continue to engage patient in PT/OT groups 1 - 2x/week.  CHARM Hamilton Bertin PT, DPT 06/05/24, 1:37 PM

## 2024-06-05 NOTE — Group Note (Signed)
 Date:  06/05/2024 Time:  11:09 AM  Group Topic/Focus:  Morning strech with Norleen    Participation Level:  Did Not Attend    [name] Bias 06/05/2024, 11:09 AM

## 2024-06-05 NOTE — Group Note (Signed)
 Recreation Therapy Group Note   Group Topic:Other  Group Date: 06/05/2024 Start Time: 1400 End Time: 1450 Facilitators: Celestia Jeoffrey BRAVO, LRT, CTRS Location: Dayroom  Activity Description/Intervention: Therapeutic Drumming. Patients with peers and staff were given the opportunity to engage in a leader facilitated [employer] Group Empowerment Drumming Circle with staff from the [employer], in partnership with [employer]. Teaching laboratory technician and trained [employer], Norleen Mon leading with LRT observing and documenting intervention and pt response. This evidenced-based practice targets 7 areas of health and wellbeing in the human experience including: stress-reduction, exercise, self-expression, camaraderie/support, nurturing, spirituality, and music-making (leisure).    Goal Area(s) Addresses:  Patient will engage in pro-social way in music group.  Patient will follow directions of drum leader on the first prompt. Patient will demonstrate no behavioral issues during group.  Patient will identify if a reduction in stress level occurs as a result of participation in therapeutic drum circle.   Affect/Mood: [name]   Participation Level: Did not attend    Clinical Observations/Individualized Feedback: Patient did not attend group.  Plan: Continue to engage patient in RT group sessions 2-3x/week.   Jeoffrey BRAVO Celestia, LRT, CTRS 06/05/2024 5:12 PM
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
## 2024-06-05 NOTE — Progress Notes (Signed)
   06/05/24 0054  Psych Admission Type (Psych Patients Only)  Admission Status Involuntary  Psychosocial Assessment  Patient Complaints None  Eye Contact Brief  Facial Expression Flat  Affect Flat  Speech Soft  Interaction Guarded;Isolative  Motor Activity Slow  Appearance/Hygiene In scrubs  Behavior Characteristics Cooperative  Mood Pleasant  Thought Process  Coherency Disorganized  Content Preoccupation  Delusions None reported or observed  Perception Hallucinations  Hallucination Auditory  Judgment Impaired  Confusion Mild  Danger to Self  Current suicidal ideation? Denies  Description of Suicide Plan n/a  Self-Injurious Behavior No self-injurious ideation or behavior indicators observed or expressed   Agreement Not to Harm Self Yes  Description of Agreement verbal  Danger to Others  Danger to Others None reported or observed    Estimated Sleeping Duration (Last 24 Hours): 9.50-11.25 hours (Due to Daylight Saving Time, the durations displayed may not accurately represent documentation during the time change interval)

## 2024-06-06 NOTE — Progress Notes (Signed)
   06/06/24 0617  15 Minute Checks  Location Bedroom  Visual Appearance Calm  Behavior Sleeping  Sleep (Behavioral Health Patients Only)  Calculate sleep? (Click Yes once per 24 hr at 0600 safety check) Yes  Documented sleep last 24 hours 13

## 2024-06-06 NOTE — Progress Notes (Signed)
   06/06/24 1936  Psych Admission Type (Psych Patients Only)  Admission Status Involuntary  Psychosocial Assessment  Patient Complaints None  Eye Contact Brief  Facial Expression Flat  Affect Preoccupied;Flat  Speech Soft  Interaction Isolative  Motor Activity Slow  Appearance/Hygiene In scrubs  Behavior Characteristics Cooperative  Mood Pleasant  Thought Process  Coherency Disorganized  Content Preoccupation  Delusions None reported or observed  Perception WDL  Hallucination None reported or observed  Judgment Impaired  Confusion Mild  Danger to Self  Current suicidal ideation? Denies  Self-Injurious Behavior No self-injurious ideation or behavior indicators observed or expressed   Agreement Not to Harm Self Yes  Description of Agreement Verbal  Danger to Others  Danger to Others None reported or observed

## 2024-06-06 NOTE — Group Note (Signed)
 LCSW Group Therapy Note  Group Date: 06/06/2024 Start Time: 1300 End Time: 1400   Type of Therapy and Topic:  Group Therapy: Positive Affirmations  Participation Level:  Did Not Attend   Description of Group:   This group addressed positive affirmation towards self and others.  Patients went around the room and identified two positive things about themselves and two positive things about a peer in the room.  Patients reflected on how it felt to share something positive with others, to identify positive things about themselves, and to hear positive things from others/ Patients were encouraged to have a daily reflection of positive characteristics or circumstances.   Therapeutic Goals: Patients will verbalize two of their positive qualities Patients will demonstrate empathy for others by stating two positive qualities about a peer in the group Patients will verbalize their feelings when voicing positive self affirmations and when voicing positive affirmations of others Patients will discuss the potential positive impact on their wellness/recovery of focusing on positive traits of self and others.  Summary of Patient Progress:  Patient did not attend.   Therapeutic Modalities:   Cognitive Behavioral Therapy Motivational Interviewing    Alveta CHRISTELLA Kerns, LCSWA 06/06/2024  2:02 PM

## 2024-06-06 NOTE — Plan of Care (Signed)
  Problem: Activity: Goal: Interest or engagement in activities will improve Outcome: Not Progressing   Problem: Physical Regulation: Goal: Ability to maintain clinical measurements within normal limits will improve Outcome: Progressing   Problem: Safety: Goal: Periods of time without injury will increase Outcome: Progressing

## 2024-06-06 NOTE — Group Note (Signed)
 Date:  06/06/2024 Time:  10:35 PM  Group Topic/Focus:  Wrap-Up Group:   The focus of this group is to help patients review their daily goal of treatment and discuss progress on daily workbooks.    Participation Level:  Did Not Attend  Participation Quality:     Affect:     Cognitive:     Insight: None  Engagement in Group:  None  Modes of Intervention:     Additional Comments:    [name] 06/06/2024, 10:35 PM

## 2024-06-06 NOTE — Plan of Care (Signed)
  Problem: Education: Goal: Emotional status will improve Outcome: Progressing   Problem: Education: Goal: Mental status will improve Outcome: Not Progressing

## 2024-06-06 NOTE — Progress Notes (Signed)
   06/06/24 0900  Psych Admission Type (Psych Patients Only)  Admission Status Involuntary  Psychosocial Assessment  Patient Complaints None  Eye Contact Brief  Facial Expression Flat  Affect Preoccupied  Speech Soft  Interaction Isolative  Motor Activity Slow  Appearance/Hygiene In scrubs  Behavior Characteristics Appropriate to situation  Mood Pleasant  Thought Process  Coherency Disorganized  Content Preoccupation  Delusions None reported or observed  Perception WDL  Hallucination None reported or observed  Judgment Impaired  Confusion Mild  Danger to Self  Current suicidal ideation? Denies  Agreement Not to Harm Self Yes  Description of Agreement verbal  Danger to Others  Danger to Others None reported or observed

## 2024-06-06 NOTE — Group Note (Signed)
 Date:  06/06/2024 Time:  11:01 AM  Group Topic/Focus:   Exercise provides significant physical and mental health benefits for seniors, including improved bone and muscle strength, a lower risk of falls, and better management of chronic conditions like heart disease and diabetes. It also boosts cognitive function, enhances mood, and can lead to a longer, more independent life.  Participation Level:  Did Not Attend   Harlene LITTIE Gavel 06/06/2024, 11:01 AM
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
## 2024-06-06 NOTE — Group Note (Signed)
 Recreation Therapy Group Note   Group Topic:Coping Skills  Group Date: 06/06/2024 Start Time: 1400 End Time: 1455 Facilitators: Celestia Jeoffrey BRAVO, LRT, CTRS Location: Dayroom   Group Description: Mind Map.  Patient was provided a blank template of a diagram with 32 blank boxes in a tiered system, branching from the center (similar to a bubble chart). LRT directed patients to label the middle of the diagram Coping Skills. LRT and patients then came up with 8 different coping skills as examples. Pt were directed to record their coping skills in the 2nd tier boxes closest to the center.  Patients would then share their coping skills with the group as LRT wrote them out. LRT gave a handout of 99 different coping skills at the end of group. LRT and patients went outside to the courtyard for fresh air and sunlight with time remaining.   Goal Area(s) Addressed: Patients will be able to define "coping skills". Patient will identify new coping skills.  Patient will increase communication.   Affect/Mood: [name]   Participation Level: Did not attend    Clinical Observations/Individualized Feedback: Patient did not attend group.   Plan: Continue to engage patient in RT group sessions 2-3x/week.   Jeoffrey BRAVO Celestia, LRT, CTRS 06/06/2024 4:42 PM

## 2024-06-06 NOTE — Plan of Care (Signed)
   Problem: Education: Goal: Emotional status will improve Outcome: Not Progressing Goal: Mental status will improve Outcome: Not Progressing   Problem: Activity: Goal: Interest or engagement in activities will improve Outcome: Not Progressing

## 2024-06-06 NOTE — B[facility] Counselor (Addendum)
 ADDENDUM CSW spoke with Etha Chang, [phone] ([phone](cell).  She reports that she is the [facility] APS worker working with the patient's family.  She reports that she has been assigned the case.    CSW asked for status of the patient's case.  She reports that a protective order has been filed however reports that no court dates have been provided.  She reports that she is aware of the petition at this time.  CSW asked for clarification on who can provide permission for the administration/change of medications for patient.  Nichole reports we are working together as a team, so if she is in need right now, let's not hinder progress, if we can use next of kin.  CSW provided the patient's brothers name and contact information.  CSW explained that at this time the brother is open to becoming guardian.  Nichole reports plans to reach out to the patient's brother.   She requests that a test email be sent of the patient's latest progress notes and any recommendations on placement.  Email address: [email or web address].  Sherryle Margo, MSW, LCSW 06/06/2024 9:52 AM   ADDENDUM CSW spoke with Ryan with [facility] APS Intake.  CSW explained that several attempts have been made to speak with [facility] APS with answer.  Ryan looked up [employer] and was unable to find him in [employer].  He took down this educational psychologist for staff to call back.  Sherryle Margo, MSW, LCSW 06/06/2024 9:10 AM    CSW attempted to contact [place]y [place], DSS, [phone].  The number provided doesn't appear to be in working order.   CSW contacted the main [facility] Department of Social Services line. CSW was on hold ofor over 10 minutes with no answer.  CSW attempted to contact Winda Dam, [phone], Financial Controller.  CSW left HIPAA compliant voicemail.  CSW contacted the supervisor for [place], Shirley McClain, Permanency  Planning Supervisor/Program Manager.  CSW left HIPAA compliant voicemail.   CSW will continue to make attempts.  Sherryle Margo, MSW, LCSW 06/06/2024 8:55 AM

## 2024-06-06 NOTE — B[facility] Counselor (Signed)
 CSW sent information to DSS at their request.  Sherryle Margo, MSW, LCSW 06/06/2024 4:26 PM
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
## 2024-06-07 NOTE — BH IP Treatment Plan (Signed)
 Interdisciplinary Treatment and Diagnostic Plan Update  06/07/2024 Time of Session: 2:45 PM  [name] MRN: [id number]  Principal Diagnosis: <principal problem not specified>  Secondary Diagnoses: Active Problems:   Schizophrenia, paranoid (HCC)   Current Medications:  Current Facility-Administered Medications  Medication Dose Route Frequency Provider Last Rate Last Admin   acetaminophen  (TYLENOL ) tablet 650 mg  650 mg Oral Q6H PRN Starkes-Perry, Takia S, FNP       alum & mag hydroxide-simeth (MAALOX/MYLANTA) 200-200-20 MG/5ML suspension 30 mL  30 mL Oral Q4H PRN Starkes-Perry, Takia S, FNP       HYDROcodone-acetaminophen  (NORCO/VICODIN) 5-325 MG per tablet 1-2 tablet  1-2 tablet Oral Q4H PRN Starkes-Perry, Takia S, FNP       magnesium  hydroxide (MILK OF MAGNESIA) suspension 30 mL  30 mL Oral Daily PRN Starkes-Perry, Takia S, FNP       multivitamin with minerals tablet 1 tablet  1 tablet Oral Daily Wilkie Majel RAMAN, FNP   1 tablet at 06/07/24 9073   OLANZapine  (ZYPREXA ) injection 5 mg  5 mg Intramuscular TID PRN Wilkie Majel RAMAN, FNP       OLANZapine  (ZYPREXA ) tablet 15 mg  15 mg Oral BID Jadapalle, Sree, MD   15 mg at 06/07/24 9073   OLANZapine  zydis (ZYPREXA ) disintegrating tablet 5 mg  5 mg Oral TID PRN Starkes-Perry, Takia S, FNP       ondansetron  (ZOFRAN ) tablet 4 mg  4 mg Oral Q6H PRN Starkes-Perry, Majel RAMAN, FNP       Or   ondansetron  (ZOFRAN ) injection 4 mg  4 mg Intravenous Q6H PRN Starkes-Perry, Majel RAMAN, FNP       thiamine  (VITAMIN B1) tablet 100 mg  100 mg Oral Daily Wilkie Majel RAMAN, FNP   100 mg at 06/07/24 9073   traZODone  (DESYREL ) tablet 50 mg  50 mg Oral QHS Starkes-Perry, Takia S, FNP   50 mg at 06/06/24 2135   PTA Medications: Medications Prior to Admission  Medication Sig Dispense Refill Last Dose/Taking   feeding supplement (ENSURE PLUS HIGH PROTEIN) LIQD Take 237 mLs by mouth 3 (three) times daily between meals.      Multiple Vitamin  (MULTIVITAMIN WITH MINERALS) TABS tablet Take 1 tablet by mouth daily.      OLANZapine  (ZYPREXA ) 5 MG tablet Take 1 tablet (5 mg total) by mouth 2 (two) times daily.      OLANZapine  (ZYPREXA ) injection Inject 1 mL (5 mg total) into the muscle 2 (two) times daily.      thiamine  (VITAMIN B-1) 100 MG tablet Take 1 tablet (100 mg total) by mouth daily.      traZODone  (DESYREL ) 50 MG tablet Take 1 tablet (50 mg total) by mouth at bedtime.       Patient Stressors:    Patient Strengths:    Treatment Modalities: Medication Management, Group therapy, Case management,  1 to 1 session with clinician, Psychoeducation, Recreational therapy.   Physician Treatment Plan for Primary Diagnosis: <principal problem not specified> Long Term Goal(s): Improvement in symptoms so as ready for discharge   Short Term Goals: Ability to identify changes in lifestyle to reduce recurrence of condition will improve  Medication Management: Evaluate patient's response, side effects, and tolerance of medication regimen.  Therapeutic Interventions: 1 to 1 sessions, Unit Group sessions and Medication administration.  Evaluation of Outcomes: Not Progressing  Physician Treatment Plan for Secondary Diagnosis: Active Problems:   Schizophrenia, paranoid (HCC)  Long Term Goal(s): Improvement in symptoms so as ready for discharge  Short Term Goals: Ability to identify changes in lifestyle to reduce recurrence of condition will improve     Medication Management: Evaluate patient's response, side effects, and tolerance of medication regimen.  Therapeutic Interventions: 1 to 1 sessions, Unit Group sessions and Medication administration.  Evaluation of Outcomes: Not Progressing   RN Treatment Plan for Primary Diagnosis: <principal problem not specified> Long Term Goal(s): Knowledge of disease and therapeutic regimen to maintain health will improve  Short Term Goals: Ability to remain free from injury will improve,  Ability to verbalize frustration and anger appropriately will improve, Ability to demonstrate self-control, Ability to participate in decision making will improve, Ability to verbalize feelings will improve, Ability to disclose and discuss suicidal ideas, Ability to identify and develop effective coping behaviors will improve, and Compliance with prescribed medications will improve  Medication Management: RN will administer medications as ordered by provider, will assess and evaluate patient's response and provide education to patient for prescribed medication. RN will report any adverse and/or side effects to prescribing provider.  Therapeutic Interventions: 1 on 1 counseling sessions, Psychoeducation, Medication administration, Evaluate responses to treatment, Monitor vital signs and CBGs as ordered, Perform/monitor CIWA, COWS, AIMS and Fall Risk screenings as ordered, Perform wound care treatments as ordered.  Evaluation of Outcomes: Not Progressing   LCSW Treatment Plan for Primary Diagnosis: <principal problem not specified> Long Term Goal(s): Safe transition to appropriate next level of care at discharge, Engage patient in therapeutic group addressing interpersonal concerns.  Short Term Goals: Engage patient in aftercare planning with referrals and resources, Increase social support, Increase ability to appropriately verbalize feelings, Increase emotional regulation, Facilitate acceptance of mental health diagnosis and concerns, Facilitate patient progression through stages of change regarding substance use diagnoses and concerns, Identify triggers associated with mental health/substance abuse issues, and Increase skills for wellness and recovery  Therapeutic Interventions: Assess for all discharge needs, 1 to 1 time with Social worker, Explore available resources and support systems, Assess for adequacy in community support network, Educate family and significant other(s) on suicide prevention,  Complete Psychosocial Assessment, Interpersonal group therapy.  Evaluation of Outcomes: Not Progressing   Progress in Treatment: Attending groups: No. Participating in groups: No. Taking medication as prescribed: Yes. Toleration medication: Yes. Family/Significant other contact made: No, will contact:  legal guardian Patient understands diagnosis: No. Discussing patient identified problems/goals with staff: Yes. Medical problems stabilized or resolved: Yes. Denies suicidal/homicidal ideation: Yes. Issues/concerns per patient self-inventory: No. Other:    New problem(s) identified: No, Describe:  None identified. Update: 05/13/2024 No changes at this time. Update 05/18/2024:  No changes at this time. Update 05/23/2024:  No changes at this time. Update 05/28/2024:  No changes at this time. Update 06/02/2024:  No changes at this time. Update 06/07/2024:  No changes at this time.             New Short Term/Long Term Goal(s): elimination of symptoms of psychosis, medication management for mood stabilization; elimination of SI thoughts; development of comprehensive mental wellness plan. Update: 05/13/2024 No changes at this time.Update: 05/18/2024 No changes at this time. Update 05/23/2024:  No changes at this time. Update 05/28/2024:  No changes at this time. Update 06/02/2024:  No changes at this time. Update 06/07/2024:  No changes at this time.       Patient Goals:  I just need to leave, I don't need it hereUpdate: 05/13/2024 No changes at this time.Update: 05/18/2024 No changes at this time. Update 05/23/2024:  No changes at this time.Update  05/28/2024:  No changes at this time. Update 06/02/2024:  No changes at this time. Update 06/07/2024:  No changes at this time.       Discharge Plan or Barriers: CSW will assist with appropriate discharge planning. Update: 05/13/2024 No changes at this time.Update: 05/18/2024 No changes at this time. Update 05/23/2024:  No changes at this  time.Update 05/28/2024:  CSW unable to identify appropriate decision maker, as pt's legal guardian has passed and CSW has reached out to APS caseworker multiple times to get clarity on protocol for enforcing medication until an appropriate party has been legal identified Update 06/02/2024:  No changes at this time. Update 06/07/2024:  CSW and provider still working to clarify appropriate horticulturist, commercial. Please refer to extensive notes in medical record.      Reason for Continuation of Hospitalization: Aggression Hallucinations Medication stabilization   Estimated Length of Stay: 1 to 7 days. Update: 05/13/2024 TBDUpdate: 05/18/2024 TBD Update 05/23/2024:  TBD Update 05/28/2024:  TBDUpdate 06/02/2024:  TBD Update 06/07/2024:  TBD  Last 3 [place] Suicide Severity Risk Score: Flowsheet Row Admission (Current) from 05/06/2024 in [facility] to Hosp-Admission (Discharged) from 05/02/2024 in [facility] from [facility]/30/2024 in [facility]  C-SSRS RISK CATEGORY No Risk No Risk No Risk    Last [facility] 2/9 Scores:     No data to display          Scribe for Treatment Team: [name], ISRAEL 06/07/2024 3:11 PM

## 2024-06-07 NOTE — Progress Notes (Signed)
 SI/HI/AVH: denies  Behavior/Mood: appropriate/pleasant but preoccupied   Interaction/Group attendance: isolative/none   Medication/PRNs: compliant/none   Pain: denies  Other: guardianship issues. See SW note 11/19.

## 2024-06-07 NOTE — Group Note (Signed)
 Date:  06/07/2024 Time:  10:22 AM  Group Topic/Focus:  Meditation therapy    Participation Level:  Did Not Attend    [name] Bias 06/07/2024, 10:22 AM

## 2024-06-07 NOTE — Plan of Care (Signed)
  Problem: Education: Goal: Knowledge of General Education information will improve Description: Including pain rating scale, medication(s)/side effects and non-pharmacologic comfort measures Outcome: Progressing   Problem: Health Behavior/Discharge Planning: Goal: Ability to manage health-related needs will improve Outcome: Not Progressing   

## 2024-06-07 NOTE — Group Note (Signed)
 Date:  06/07/2024 Time:  4:12 PM  Group Topic/Focus:  Fresh air therapy with music and conversation    Participation Level:  Did Not Attend    [name] Bias 06/07/2024, 4:12 PM

## 2024-06-07 NOTE — BHH Counselor (Signed)
 CSW called Etha Chang with [facility] back with CSW [place] present.   Nichole reiterated that the patient's brother can act as management consultant for the patient in medical decisions as next of kin.  CSW team asked for documentation of this.  CSW team was informed that no documentation can be provided at this time.   Nichole reiterated that at this time the patient does not have a guardian as her guardian is deceased and DSS and pt's brother have filed a petition to become guardian, however, there is no court date established.   CSW team asked for clarification on if there is an order or ex parte for immediate guardianship and was informed that there is no plans for that at this time.    Provider, psychiatrist, joined the conversation and explained that with psychiatric needs there needs to be consent for treatment.  Provider explained that the patient is psychotic and isolative to her room.  Provider further explained that there is no immediate danger or crisis to patient that would supercede consent for care.   Nichole reports that she will staff with her supervisor and follow up.   Sherryle Margo, MSW, LCSW 06/07/2024 2:50 PM

## 2024-06-07 NOTE — BHH Counselor (Signed)
 CSW contacted Etha Chang with [facility] DSS.   CSW inquired about court date.  Nichole reports that there is not a court date at this time.  She reports that both [facility] DSS and the pt's brother have filed a petition for guardianship, however, there has been no progress on this.  CSW informed of plans of IVC hearing per the provider.  DSS expressed understanding.   CSW asked for DSS to confirm that pt's brother can make care decisions for the patient as the next of kin.  Nichole confirmed this.    Call ended with no incident.  Sherryle Margo, MSW, LCSW 06/07/2024 2:45 PM

## 2024-06-08 NOTE — Plan of Care (Signed)
  Problem: Activity: Goal: Sleeping patterns will improve Outcome: Progressing   Problem: Coping: Goal: Ability to demonstrate self-control will improve Outcome: Progressing   

## 2024-06-08 NOTE — Plan of Care (Signed)
  Problem: Coping: Goal: Ability to demonstrate self-control will improve Outcome: Progressing   Problem: Coping: Goal: Ability to verbalize frustrations and anger appropriately will improve Outcome: Not Progressing

## 2024-06-08 NOTE — Progress Notes (Signed)
 SI/HI/AVH: denies   Behavior/Mood: appropriate/pleasant but preoccupied    Interaction/Group attendance: isolative/no groups   Medication/PRNs: compliant/none   Pain: denies   Other: guardianship issues. See SW note 11/19.

## 2024-06-08 NOTE — Group Note (Signed)
 Date:  06/08/2024 Time:  4:14 PM  Group Topic/Focus:  Developing a Wellness Toolbox:   The focus of this group is to help patients develop a wellness toolbox with skills and strategies to promote recovery upon discharge. Overcoming Stress:   The focus of this group is to define stress and help patients assess their triggers.    Participation Level:  Did Not Attend  Participation Quality:    Affect:    Cognitive:    Insight:   Engagement in Group:   Modes of Intervention:    Additional Comments:    [name] 06/08/2024, 4:14 PM

## 2024-06-08 NOTE — Progress Notes (Signed)
   06/08/24 0224  Psych Admission Type (Psych Patients Only)  Admission Status Involuntary  Psychosocial Assessment  Patient Complaints None  Eye Contact Brief  Facial Expression Flat  Affect Preoccupied  Speech Soft  Interaction Isolative;Guarded  Motor Activity Slow  Appearance/Hygiene In scrubs  Behavior Characteristics Appropriate to situation  Mood Preoccupied;Pleasant  Thought Process  Coherency Disorganized  Content Preoccupation  Delusions None reported or observed  Perception WDL  Hallucination None reported or observed  Judgment Impaired  Confusion Mild  Danger to Self  Current suicidal ideation? Denies  Description of Suicide Plan n/a  Self-Injurious Behavior No self-injurious ideation or behavior indicators observed or expressed   Agreement Not to Harm Self Yes  Description of Agreement verbal  Danger to Others  Danger to Others None reported or observed     Patient has remained withdrawn to self and room since the onset of the shift. No complaints voiced. No distress noted/ reported. Easily compliant with scheduled medications. Rested in bed throughout the night. All orders maintained as written.   Estimated Sleeping Duration (Last 24 Hours): 9.00-11.00 hours (Due to Daylight Saving Time, the durations displayed may not accurately represent documentation during the time change interval)

## 2024-06-08 NOTE — Group Note (Signed)
 Recreation Therapy Group Note   Group Topic:Health and Wellness  Group Date: 06/08/2024 Start Time: 1400 End Time: 1450 Facilitators: Celestia Jeoffrey BRAVO, LRT, CTRS Location: Courtyard  Group Description: Outdoor Recreation. Patients had the option to play corn hole, ring toss, UNO, or listening to music while outside in the courtyard getting fresh air and sunlight. Patients helped water  and prune the raised garden beds. LRT and patients discussed things that they enjoy doing in their free time outside of the hospital. LRT encouraged patients to drink water  after being active and getting their heart rate up.   Goal Area(s) Addressed: Patient will identify leisure interests.  Patient will practice healthy decision making. Patient will engage in recreation activity.   Affect/Mood: [name]   Participation Level: Did not attend    Clinical Observations/Individualized Feedback: Patient did not attend group.   Plan: Continue to engage patient in RT group sessions 2-3x/week.   Jeoffrey BRAVO Celestia, LRT, CTRS 06/08/2024 4:30 PM

## 2024-06-08 NOTE — Group Note (Signed)
 LCSW Group Therapy Note  Group Date: 06/08/2024 Start Time: 1315 End Time: 1345   Type of Therapy and Topic:  Group Therapy - Healthy vs Unhealthy Coping Skills  Participation Level:  Did Not Attend   Description of Group The focus of this group was to determine what unhealthy coping techniques typically are used by group members and what healthy coping techniques would be helpful in coping with various problems. Patients were guided in becoming aware of the differences between healthy and unhealthy coping techniques. Patients were asked to identify 2-3 healthy coping skills they would like to learn to use more effectively.  Therapeutic Goals Patients learned that coping is what human beings do all day long to deal with various situations in their lives Patients defined and discussed healthy vs unhealthy coping techniques Patients identified their preferred coping techniques and identified whether these were healthy or unhealthy Patients determined 2-3 healthy coping skills they would like to become more familiar with and use more often. Patients provided support and ideas to each other   Summary of Patient Progress:  X   Therapeutic Modalities Cognitive Behavioral Therapy Motivational Interviewing  [name], LCSWA 06/08/2024  1:36 PM

## 2024-06-08 NOTE — Progress Notes (Addendum)
 Phone call back [facility] MD Progress Note  06/08/2024 11:29 AM [name]  MRN:  [id number]  [name] is a 63 y.o. female admitted: Medicallyfor 05/02/2024 10:56 AM for dehydration, failure to thrive and hypoglycemia. She carries the psychiatric diagnoses of Schizophrenia and has medical hx of hypoglycemia and failure to thrive and has a past medical history of sinus bradycardia, chronic anemia, severe malnutrition and recurrent hypoglycemia.. Her current presentation of verbal aggression, agitation and refusal to eat with severe paranoia is most consistent with paranoid schizophrenia. [name]  She was at [facility] where she was admitted on 04/30/2024.  She was transferred to the ED because of failure to thrive and was admitted to medical floor.Patient is on IVC and patient has been adamantly refusing all oral medications and refusing to eat.  Patient is admitted to [facility] unit with Q15 min safety monitoring. Multidisciplinary team approach is offered. Medication management; group/milieu therapy is offered.    Subjective:  Chart reviewed, case discussed in multidisciplinary meeting, patient seen during rounds.    Patient is noted to be standing in the room.  She remains verbally minimally engaged but responds to the questions regarding her physical health.  She denies having any physical complaints and denies symptoms of EPS or stiffness.  Per nursing patient is taking her medications and comes out of her room only for meals.  Provider tried to discuss the current treatment plan of waiting for her legal guardian but patient got very upset and yelled at the provider to leave the room.  Interview was terminated.   Past Psychiatric History: see h&P Family History: History reviewed. No pertinent family history. Social History:  Social History   Substance and Sexual Activity  Alcohol Use Not Currently   Comment: Refuses to disclose how much     Social History   Substance and Sexual Activity  Drug Use No    Comment: Refuses to answer    Social History   Socioeconomic History   Marital status: Single    Spouse name: Not on file   Number of children: Not on file   Years of education: Not on file   Highest education level: Not on file  Occupational History   Not on file  Tobacco Use   Smoking status: Every Day    Current packs/day: 2.00    Types: Cigarettes    Passive exposure: Current   Smokeless tobacco: Never  Vaping Use   Vaping status: Never Used  Substance and Sexual Activity   Alcohol use: Not Currently    Comment: Refuses to disclose how much   Drug use: No    Comment: Refuses to answer   Sexual activity: Not Currently    Comment: refused to answer  Other Topics Concern   Not on file  Social History Narrative   Not on file   Social Drivers of Health   Financial Resource Strain: Not on file  Food Insecurity: Patient Declined (05/06/2024)   Hunger Vital Sign    Worried About Running Out of Food in the Last Year: Patient declined    Ran Out of Food in the Last Year: Patient declined  Transportation Needs: Patient Declined (05/06/2024)   PRAPARE - Administrator, Civil Service (Medical): Patient declined    Lack of Transportation (Non-Medical): Patient declined  Physical Activity: Not on file  Stress: Not on file  Social Connections: Not on file   Past Medical History:  Past Medical History:  Diagnosis Date   Anemia  10/02/2021   Non compliance w medication regimen    Schizophrenia [facility])     Past Surgical History:  Procedure Laterality Date   BIOPSY  09/25/2021   Procedure: BIOPSY;  Surgeon: Teressa Toribio SQUIBB, MD;  Location: [facility];  Service: Gastroenterology;;   ESOPHAGOGASTRODUODENOSCOPY (EGD) WITH PROPOFOL  [name] 09/25/2021   Procedure: ESOPHAGOGASTRODUODENOSCOPY (EGD) WITH PROPOFOL ;  Surgeon: Teressa Toribio SQUIBB, MD;  Location: [facility];  Service: Gastroenterology;  Laterality: [name];  With NGT placement    Current Medications: Current  Facility-Administered Medications  Medication Dose Route Frequency Provider Last Rate Last Admin   acetaminophen  (TYLENOL ) tablet 650 mg  650 mg Oral Q6H PRN Starkes-Perry, Takia S, FNP       alum & mag hydroxide-simeth (MAALOX/MYLANTA) 200-200-20 MG/5ML suspension 30 mL  30 mL Oral Q4H PRN Starkes-Perry, Takia S, FNP       HYDROcodone-acetaminophen  (NORCO/VICODIN) 5-325 MG per tablet 1-2 tablet  1-2 tablet Oral Q4H PRN Starkes-Perry, Takia S, FNP       magnesium  hydroxide (MILK OF MAGNESIA) suspension 30 mL  30 mL Oral Daily PRN Starkes-Perry, Takia S, FNP       multivitamin with minerals tablet 1 tablet  1 tablet Oral Daily Starkes-Perry, Takia S, FNP   1 tablet at 06/08/24 9047   OLANZapine  (ZYPREXA ) injection 5 mg  5 mg Intramuscular TID PRN Wilkie Majel RAMAN, FNP       OLANZapine  (ZYPREXA ) tablet 15 mg  15 mg Oral BID Daryan Cagley, MD   15 mg at 06/08/24 9046   OLANZapine  zydis (ZYPREXA ) disintegrating tablet 5 mg  5 mg Oral TID PRN Starkes-Perry, Takia S, FNP       ondansetron  (ZOFRAN ) tablet 4 mg  4 mg Oral Q6H PRN Starkes-Perry, Majel RAMAN, FNP       Or   ondansetron  (ZOFRAN ) injection 4 mg  4 mg Intravenous Q6H PRN Starkes-Perry, Majel RAMAN, FNP       thiamine  (VITAMIN B1) tablet 100 mg  100 mg Oral Daily Wilkie Majel RAMAN, FNP   100 mg at 06/08/24 9047   traZODone  (DESYREL ) tablet 50 mg  50 mg Oral QHS Starkes-Perry, Takia S, FNP   50 mg at 06/07/24 2112    Lab Results:  No results found for this or any previous visit (from the past 48 hours).   Blood Alcohol level:  Lab Results  Component Value Date   [facility] <15 05/02/2024   ETH <15 05/01/2024    Metabolic Disorder Labs: Lab Results  Component Value Date   HGBA1C 5.6 05/14/2024   MPG 114.02 05/14/2024   MPG 116.89 05/02/2024   Lab Results  Component Value Date   PROLACTIN 13.6 04/16/2015   Lab Results  Component Value Date   CHOL 219 (H) 05/14/2024   TRIG 84 05/14/2024   HDL 83 05/14/2024   CHOLHDL 2.6  05/14/2024   VLDL 17 05/14/2024   LDLCALC 119 (H) 05/14/2024   LDLCALC 130 (H) 05/01/2024    Physical Findings: AIMS:  , ,  ,  ,    CIWA:    COWS:      Psychiatric Specialty Exam:  Presentation  General Appearance:  Casual  Eye Contact: Fair  Speech: Normal rate  Speech Volume: Increased    Mood and Affect  Mood: Angry  Affect: Congruent   Thought Process  Thought Processes: Disorganized  Descriptions of Associations:Tangential  Orientation:Partial  Thought Content:Illogical; Tangential  Hallucinations:RESPONDING TO INTERNAL STIMULI Ideas of Reference:Paranoia; Delusions; Percusatory  Suicidal Thoughts:not endorsing Homicidal Thoughts:nor  endorsing  Sensorium  Memory: Recent Poor  Judgment: Poor  Insight: Poor   Executive Functions  Concentration: Poor  Attention Span: Poor  Recall: Poor  Fund of Knowledge: Poor  Language: Poor   Psychomotor Activity  Psychomotor Activity:No data recorded Musculoskeletal: Strength & Muscle Tone: within normal limits Gait & Station: normal Assets  Assets: Resilience    Physical Exam: Physical Exam Vitals and nursing note reviewed.    ROS Blood pressure 98/60, pulse (!) 57, temperature (!) 97.5 F (36.4 C), resp. rate 16, height 5' 4 (1.626 m), weight 44.9 kg, SpO2 99%. Body mass index is 16.99 kg/m.  Diagnosis: Active Problems:   Schizophrenia, paranoid (HCC) Paranoid  PLAN: Safety and Monitoring:  -- Involuntary admission to inpatient psychiatric unit for safety, stabilization and treatment  -- Daily contact with patient to assess and evaluate symptoms and progress in treatment  -- Patient's case to be discussed in multi-disciplinary team meeting  -- Observation Level : q15 minute checks  -- Vital signs:  q12 hours  -- Precautions: suicide, elopement, and assault -- Encouraged patient to participate in unit milieu and in scheduled group therapies  2. Psychiatric  Diagnoses and Treatment: Patient  2 physicians approved non emergent forced medications-Dr.Madaram and Dr.Ravon Mortellaro  Zyprexa  15 mg BID oral or Im -non emergent forced medication-patient is taking her oral medication Will add second antipsychotic haldol   to help with the residual psychotic symptoms like Prolixin  once consent is received.-Waiting to hear from DSS caseworker for consent as brother already consented . Discharge Planning:   -- Social work and case management to assist with discharge planning and identification of hospital follow-up needs prior to discharge  -- Estimated LOS: 3-4 days  Allyn Foil, MD 06/08/2024, 11:29 AM

## 2024-06-08 NOTE — Group Note (Signed)
 Date:  06/08/2024 Time:  10:53 AM  Group Topic/Focus:  Healthy Communication:   The focus of this group is to discuss communication, barriers to communication, as well as healthy ways to communicate with others.    Participation Level:  Did Not Attend   [name] 06/08/2024, 10:53 AM

## 2024-06-09 NOTE — Plan of Care (Signed)
   Problem: Education: Goal: Knowledge of [facility] Education information/materials will improve Outcome: Progressing Goal: Emotional status will improve Outcome: Progressing Goal: Mental status will improve Outcome: Progressing Goal: Verbalization of understanding the information provided will improve Outcome: Progressing   Problem: Activity: Goal: Interest or engagement in activities will improve Outcome: Progressing Goal: Sleeping patterns will improve Outcome: Progressing   Problem: Coping: Goal: Ability to verbalize frustrations and anger appropriately will improve Outcome: Progressing Goal: Ability to demonstrate self-control will improve Outcome: Progressing   Problem: Health Behavior/Discharge Planning: Goal: Identification of resources available to assist in meeting health care needs will improve Outcome: Progressing Goal: Compliance with treatment plan for underlying cause of condition will improve Outcome: Progressing   Problem: Physical Regulation: Goal: Ability to maintain clinical measurements within normal limits will improve Outcome: Progressing   Problem: Safety: Goal: Periods of time without injury will increase Outcome: Progressing   Problem: Education: Goal: Knowledge of General Education information will improve Description: Including pain rating scale, medication(s)/side effects and non-pharmacologic comfort measures Outcome: Progressing   Problem: Health Behavior/Discharge Planning: Goal: Ability to manage health-related needs will improve Outcome: Progressing   Problem: Clinical Measurements: Goal: Ability to maintain clinical measurements within normal limits will improve Outcome: Progressing Goal: Will remain free from infection Outcome: Progressing Goal: Diagnostic test results will improve Outcome: Progressing Goal: Respiratory complications will improve Outcome: Progressing Goal: Cardiovascular complication will be  avoided Outcome: Progressing   Problem: Activity: Goal: Risk for activity intolerance will decrease Outcome: Progressing   Problem: Nutrition: Goal: Adequate nutrition will be maintained Outcome: Progressing   Problem: Coping: Goal: Level of anxiety will decrease Outcome: Progressing   Problem: Elimination: Goal: Will not experience complications related to bowel motility Outcome: Progressing Goal: Will not experience complications related to urinary retention Outcome: Progressing   Problem: Pain Managment: Goal: General experience of comfort will improve and/or be controlled Outcome: Progressing   Problem: Safety: Goal: Ability to remain free from injury will improve Outcome: Progressing   Problem: Skin Integrity: Goal: Risk for impaired skin integrity will decrease Outcome: Progressing

## 2024-06-09 NOTE — Group Note (Signed)
 Physical/Occupational Therapy Group Note  Group Topic: UE Therex   Group Date: 06/09/2024 Start Time: 1500 End Time: 1543 Facilitators: Lavonda Therisa CROME, OT   Group Description: Group instructed in series of upper extremities exercises, aimed to promote strength, flexibility, range of motion and functional endurance.  Patients provided cuing for proper mechanics and proper pace of exercise; exercises adjusted as necessary for individualized patient needs.  Patient also engaged in cognitive components throughout session, working to integrate attention to task, command following, turn-taking and appropriate social interaction throughout session.  Allowed to ask questions as appropriate, and encouraged to identify specific exercises that they could complete independently outside of group sessions.     Therapeutic Goal(s):  * Demonstrate appropriate performance of upper extremity exercises to promote strength, flexibility, range of motion and functional endurance  * Identify 2-3 specific upper extremity exercises to complete as home exercise program outside of group session   Individual Participation: Pt did not attend group   Participation Level: Did not attend   Participation Quality:    Behavior:    Speech/Thought Process:    Affect/Mood:    Insight:    Judgement:    Modes of Intervention:   Patient Response to Interventions:     Plan: Continue to engage patient in PT/OT groups 1 - 2x/week.  06/09/2024  Therisa CROME Lavonda, OT   Therisa Lavonda, OTD OTR/L  06/09/24, 3:57 PM

## 2024-06-09 NOTE — Group Note (Signed)
 Date:  06/09/2024 Time:  4:17 PM  Group Topic/Focus:  Overcoming Stress:   The focus of this group is to define stress and help patients assess their triggers.    Participation Level:  Did Not Attend  [name] 06/09/2024, 4:17 PM

## 2024-06-09 NOTE — Plan of Care (Signed)
  Problem: Health Behavior/Discharge Planning: Goal: Compliance with prescribed medication regimen will improve Outcome: Progressing   Problem: Nutritional: Goal: Ability to achieve adequate nutritional intake will improve Outcome: Progressing   

## 2024-06-09 NOTE — Group Note (Signed)
 Recreation Therapy Group Note   Group Topic:Leisure Education  Group Date: 06/09/2024 Start Time: 1400 End Time: 1445 Facilitators: Celestia Jeoffrey BRAVO, LRT, CTRS Location: Dayroom  Group Description: Leisure. Patients were given the option to choose from journaling, coloring, drawing, making origami, playing with playdoh, listening to music or singing karaoke. LRT and pts discussed the meaning of leisure, the importance of participating in leisure during their free time/when they're outside of the hospital, as well as how our leisure interests can also serve as coping skills.   Goal Area(s) Addressed:  Patient will identify a current leisure interest.  Patient will learn the definition of "leisure". Patient will practice making a positive decision. Patient will have the opportunity to try a new leisure activity. Patient will communicate with peers and LRT.    Affect/Mood: [name]   Participation Level: Did not attend    Clinical Observations/Individualized Feedback: Patient did not attend group.  Plan: Continue to engage patient in RT group sessions 2-3x/week.   Jeoffrey BRAVO Celestia, LRT, CTRS 06/09/2024 4:34 PM

## 2024-06-09 NOTE — Progress Notes (Signed)
 Phone call back [facility] MD Progress Note  06/09/2024 3:26 PM [name]  MRN:  [id number]  [name] is a 63 y.o. female admitted: Medicallyfor 05/02/2024 10:56 AM for dehydration, failure to thrive and hypoglycemia. She carries the psychiatric diagnoses of Schizophrenia and has medical hx of hypoglycemia and failure to thrive and has a past medical history of sinus bradycardia, chronic anemia, severe malnutrition and recurrent hypoglycemia.. Her current presentation of verbal aggression, agitation and refusal to eat with severe paranoia is most consistent with paranoid schizophrenia. [name]  She was at [facility] where she was admitted on 04/30/2024.  She was transferred to the ED because of failure to thrive and was admitted to medical floor.Patient is on IVC and patient has been adamantly refusing all oral medications and refusing to eat.  Patient is admitted to [facility] unit with Q15 min safety monitoring. Multidisciplinary team approach is offered. Medication management; group/milieu therapy is offered.    Subjective:  Chart reviewed, case discussed in multidisciplinary meeting, patient seen during rounds.   Patient is now to be standing in her room.  She remains irritable and minimally engaging in the assessment.  She is noted to be mumbling to herself and remains very delusional, continues to refuse to engage in information regarding her family and whereabouts.  She demands discharge but is unable to identify her address or family members.  Per nursing patient is taking Zyprexa  prescribed.   Past Psychiatric History: see h&P Family History: History reviewed. No pertinent family history. Social History:  Social History   Substance and Sexual Activity  Alcohol Use Not Currently   Comment: Refuses to disclose how much     Social History   Substance and Sexual Activity  Drug Use No   Comment: Refuses to answer    Social History   Socioeconomic History   Marital status: Single    Spouse name:  Not on file   Number of children: Not on file   Years of education: Not on file   Highest education level: Not on file  Occupational History   Not on file  Tobacco Use   Smoking status: Every Day    Current packs/day: 2.00    Types: Cigarettes    Passive exposure: Current   Smokeless tobacco: Never  Vaping Use   Vaping status: Never Used  Substance and Sexual Activity   Alcohol use: Not Currently    Comment: Refuses to disclose how much   Drug use: No    Comment: Refuses to answer   Sexual activity: Not Currently    Comment: refused to answer  Other Topics Concern   Not on file  Social History Narrative   Not on file   Social Drivers of Health   Financial Resource Strain: Not on file  Food Insecurity: Patient Declined (05/06/2024)   Hunger Vital Sign    Worried About Running Out of Food in the Last Year: Patient declined    Ran Out of Food in the Last Year: Patient declined  Transportation Needs: Patient Declined (05/06/2024)   PRAPARE - Administrator, Civil Service (Medical): Patient declined    Lack of Transportation (Non-Medical): Patient declined  Physical Activity: Not on file  Stress: Not on file  Social Connections: Not on file   Past Medical History:  Past Medical History:  Diagnosis Date   Anemia 10/02/2021   Non compliance w medication regimen    Schizophrenia (HCC)     Past Surgical History:  Procedure Laterality  Date   BIOPSY  09/25/2021   Procedure: BIOPSY;  Surgeon: Teressa Toribio SQUIBB, MD;  Location: [facility];  Service: Gastroenterology;;   ESOPHAGOGASTRODUODENOSCOPY (EGD) WITH PROPOFOL  [name] 09/25/2021   Procedure: ESOPHAGOGASTRODUODENOSCOPY (EGD) WITH PROPOFOL ;  Surgeon: Teressa Toribio SQUIBB, MD;  Location: [facility];  Service: Gastroenterology;  Laterality: [name];  With NGT placement    Current Medications: Current Facility-Administered Medications  Medication Dose Route Frequency Provider Last Rate Last Admin   acetaminophen  (TYLENOL )  tablet 650 mg  650 mg Oral Q6H PRN Starkes-Perry, Takia S, FNP       alum & mag hydroxide-simeth (MAALOX/MYLANTA) 200-200-20 MG/5ML suspension 30 mL  30 mL Oral Q4H PRN Starkes-Perry, Takia S, FNP       HYDROcodone -acetaminophen  (NORCO/VICODIN) 5-325 MG per tablet 1-2 tablet  1-2 tablet Oral Q4H PRN Starkes-Perry, Takia S, FNP       magnesium  hydroxide (MILK OF MAGNESIA) suspension 30 mL  30 mL Oral Daily PRN Starkes-Perry, Takia S, FNP       multivitamin with minerals tablet 1 tablet  1 tablet Oral Daily Starkes-Perry, Takia S, FNP   1 tablet at 06/09/24 9046   OLANZapine  (ZYPREXA ) injection 5 mg  5 mg Intramuscular TID PRN Wilkie Majel RAMAN, FNP       OLANZapine  (ZYPREXA ) tablet 15 mg  15 mg Oral BID Kristiane Morsch, MD   15 mg at 06/09/24 9046   OLANZapine  zydis (ZYPREXA ) disintegrating tablet 5 mg  5 mg Oral TID PRN Starkes-Perry, Takia S, FNP       ondansetron  (ZOFRAN ) tablet 4 mg  4 mg Oral Q6H PRN Starkes-Perry, Majel RAMAN, FNP       Or   ondansetron  (ZOFRAN ) injection 4 mg  4 mg Intravenous Q6H PRN Starkes-Perry, Majel RAMAN, FNP       thiamine  (VITAMIN B1) tablet 100 mg  100 mg Oral Daily Wilkie Majel RAMAN, FNP   100 mg at 06/09/24 9046   traZODone  (DESYREL ) tablet 50 mg  50 mg Oral QHS Starkes-Perry, Takia S, FNP   50 mg at 06/08/24 2129    Lab Results:  No results found for this or any previous visit (from the past 48 hours).   Blood Alcohol level:  Lab Results  Component Value Date   [facility] <15 05/02/2024   ETH <15 05/01/2024    Metabolic Disorder Labs: Lab Results  Component Value Date   HGBA1C 5.6 05/14/2024   MPG 114.02 05/14/2024   MPG 116.89 05/02/2024   Lab Results  Component Value Date   PROLACTIN 13.6 04/16/2015   Lab Results  Component Value Date   CHOL 219 (H) 05/14/2024   TRIG 84 05/14/2024   HDL 83 05/14/2024   CHOLHDL 2.6 05/14/2024   VLDL 17 05/14/2024   LDLCALC 119 (H) 05/14/2024   LDLCALC 130 (H) 05/01/2024    Physical Findings: AIMS:  , ,   ,  ,    CIWA:    COWS:      Psychiatric Specialty Exam:  Presentation  General Appearance:  Casual  Eye Contact: Fair  Speech: Normal rate  Speech Volume: Increased    Mood and Affect  Mood: Angry  Affect: Congruent   Thought Process  Thought Processes: Disorganized  Descriptions of Associations:Tangential  Orientation:Partial  Thought Content:Illogical; Tangential  Hallucinations:RESPONDING TO INTERNAL STIMULI Ideas of Reference:Paranoia; Delusions; Percusatory  Suicidal Thoughts:not endorsing Homicidal Thoughts:nor endorsing  Sensorium  Memory: Recent Poor  Judgment: Poor  Insight: Poor   Executive Functions  Concentration: Poor  Attention Span:  Poor  Recall: Poor  Fund of Knowledge: Poor  Language: Poor   Psychomotor Activity  Psychomotor Activity:No data recorded Musculoskeletal: Strength & Muscle Tone: within normal limits Gait & Station: normal Assets  Assets: Resilience    Physical Exam: Physical Exam Vitals and nursing note reviewed.    ROS Blood pressure 94/62, pulse (!) 56, temperature (!) 97.4 F (36.3 C), resp. rate 18, height 5' 4 (1.626 m), weight 44.9 kg, SpO2 99%. Body mass index is 16.99 kg/m.  Diagnosis: Active Problems:   Schizophrenia, paranoid (HCC) Paranoid  PLAN: Safety and Monitoring:  -- Involuntary admission to inpatient psychiatric unit for safety, stabilization and treatment  -- Daily contact with patient to assess and evaluate symptoms and progress in treatment  -- Patient's case to be discussed in multi-disciplinary team meeting  -- Observation Level : q15 minute checks  -- Vital signs:  q12 hours  -- Precautions: suicide, elopement, and assault -- Encouraged patient to participate in unit milieu and in scheduled group therapies  2. Psychiatric Diagnoses and Treatment: Patient  2 physicians approved non emergent forced medications-Dr.Madaram and Dr.Tegh Franek  Zyprexa  15 mg BID  oral or Im -non emergent forced medication-patient is taking her oral medication Will add second antipsychotic haldol   to help with the residual psychotic symptoms like Prolixin  once consent is received.-Waiting to hear from DSS caseworker for consent as brother already consented . Discharge Planning:   -- Social work and case management to assist with discharge planning and identification of hospital follow-up needs prior to discharge  -- Estimated LOS: 3-4 days  Allyn Foil, MD 06/09/2024, 3:26 PM

## 2024-06-09 NOTE — Progress Notes (Signed)
 SI/HI/AVH: denies   Behavior/Mood: appropriate/pleasant but preoccupied    Interaction/Group attendance: isolative/no groups   Medication/PRNs: compliant/none   Pain: denies   Other: guardianship issues. See SW note 11/19.

## 2024-06-09 NOTE — Group Note (Signed)
 Date:  06/09/2024 Time:  10:52 AM  Group Topic/Focus:  Personal Choices and Values:   The focus of this group is to help patients assess and explore the importance of values in their lives, how their values affect their decisions, how they express their values and what opposes their expression.    Participation Level:  Did Not Attend   [name] 06/09/2024, 10:52 AM

## 2024-06-09 NOTE — Progress Notes (Signed)
   06/08/24 2000  Psych Admission Type (Psych Patients Only)  Admission Status Involuntary  Psychosocial Assessment  Patient Complaints None  Eye Contact Brief  Facial Expression Flat  Affect Preoccupied  Speech Soft  Interaction Isolative  Motor Activity Slow  Appearance/Hygiene In scrubs  Behavior Characteristics Appropriate to situation  Mood Preoccupied;Pleasant  Thought Process  Coherency Disorganized  Content Preoccupation  Delusions None reported or observed  Perception WDL  Hallucination None reported or observed  Judgment Impaired  Confusion Mild  Danger to Self  Current suicidal ideation? Denies  Self-Injurious Behavior No self-injurious ideation or behavior indicators observed or expressed   Agreement Not to Harm Self Yes  Description of Agreement verbal  Danger to Others  Danger to Others None reported or observed   Mood/Behavior:  Pleasant and cooperative. Preoccupied.   Psych assessment: Denies SI/HI and AVH.     Interaction / Group attendance:  Isolative. Minimal interaction with staff.     Medication/ PRNs: Compliant with scheduled medications. No PRNs required.    Pain: Denies  15 min checks in place for safety.

## 2024-06-10 NOTE — Group Note (Signed)
 Date:  06/10/2024 Time:  12:22 PM  Group Topic/Focus:  Making Healthy Choices:   The focus of this group is to help patients identify negative/unhealthy choices they were using prior to admission and identify positive/healthier coping strategies to replace them upon discharge.    Participation Level:  Did Not Attend  [name] 06/10/2024, 12:22 PM

## 2024-06-10 NOTE — Plan of Care (Signed)
   Problem: Education: Goal: Emotional status will improve Outcome: Progressing Goal: Mental status will improve Outcome: Progressing Goal: Verbalization of understanding the information provided will improve Outcome: Progressing

## 2024-06-10 NOTE — Plan of Care (Signed)
  Problem: Education: Goal: Emotional status will improve Outcome: Not Progressing Goal: Mental status will improve Outcome: Not Progressing Goal: Verbalization of understanding the information provided will improve Outcome: Not Progressing   Problem: Coping: Goal: Ability to verbalize frustrations and anger appropriately will improve Outcome: Not Progressing Goal: Ability to demonstrate self-control will improve Outcome: Not Progressing

## 2024-06-10 NOTE — Progress Notes (Signed)
 Behavior: Patient calm and responsive with 1-2 words during interaction. Isolating in room between meals.    Psych assessment:  Patient not willing to engage. Unable to assess SI/HI/AVH  Interaction / Group attendance:   Patient not attending group. In dayroom for meals.   Medication/ PRNs:  Medications as prescribed. No PRNs this shift  Pain:  No complaints of pain this shift  15 min checks in place for safety.   Ongoing monitoring continues.

## 2024-06-10 NOTE — Group Note (Signed)
 Date:  06/10/2024 Time:  4:15 PM  Group Topic/Focus:  Emotional Education:   The focus of this group is to discuss what feelings/emotions are, and how they are experienced.    Participation Level:  Did Not Attend  [name] 06/10/2024, 4:15 PM

## 2024-06-10 NOTE — Progress Notes (Signed)
 Pt is disoriented to situation. Denies SI/HI/AVH at this time. She is isolative to her bedroom and minimal in interaction. She denies pain. She is med compliant and safe on the unit at this time with Q15 min safety checks in place.

## 2024-06-10 NOTE — Progress Notes (Signed)
 Phone call back [facility] MD Progress Note  06/10/2024 1:03 PM [name]  MRN:  [id number]  [name] is a 63 y.o. female admitted: Medicallyfor 05/02/2024 10:56 AM for dehydration, failure to thrive and hypoglycemia. She carries the psychiatric diagnoses of Schizophrenia and has medical hx of hypoglycemia and failure to thrive and has a past medical history of sinus bradycardia, chronic anemia, severe malnutrition and recurrent hypoglycemia.. Her current presentation of verbal aggression, agitation and refusal to eat with severe paranoia is most consistent with paranoid schizophrenia. [name]  She was at [name] where she was admitted on 04/30/2024.  She was transferred to the ED because of failure to thrive and was admitted to medical floor.Patient is on IVC and patient has been adamantly refusing all oral medications and refusing to eat.  Patient is admitted to [name] unit with Q15 min safety monitoring. Multidisciplinary team approach is offered. Medication management; group/milieu therapy is offered.    Subjective:  Chart reviewed, case discussed in multidisciplinary meeting, patient seen during rounds.   Patient is now to be standing in her room.  She remains irritable and minimally engaging in the assessment.  She is noted to be mumbling to herself and remains very delusional, continues to refuse to engage in information regarding her family and whereabouts.  She demands discharge but is unable to identify her address or family members.  Per nursing patient is taking Zyprexa  prescribed.   Past Psychiatric History: see h&P Family History: History reviewed. No pertinent family history. Social History:  Social History   Substance and Sexual Activity  Alcohol Use Not Currently   Comment: Refuses to disclose how much     Social History   Substance and Sexual Activity  Drug Use No   Comment: Refuses to answer    Social History   Socioeconomic History   Marital status: Single    Spouse name:  Not on file   Number of children: Not on file   Years of education: Not on file   Highest education level: Not on file  Occupational History   Not on file  Tobacco Use   Smoking status: Every Day    Current packs/day: 2.00    Types: Cigarettes    Passive exposure: Current   Smokeless tobacco: Never  Vaping Use   Vaping status: Never Used  Substance and Sexual Activity   Alcohol use: Not Currently    Comment: Refuses to disclose how much   Drug use: No    Comment: Refuses to answer   Sexual activity: Not Currently    Comment: refused to answer  Other Topics Concern   Not on file  Social History Narrative   Not on file   Social Drivers of Health   Financial Resource Strain: Not on file  Food Insecurity: Patient Declined (05/06/2024)   Hunger Vital Sign    Worried About Running Out of Food in the Last Year: Patient declined    Ran Out of Food in the Last Year: Patient declined  Transportation Needs: Patient Declined (05/06/2024)   PRAPARE - Administrator, Civil Service (Medical): Patient declined    Lack of Transportation (Non-Medical): Patient declined  Physical Activity: Not on file  Stress: Not on file  Social Connections: Not on file   Past Medical History:  Past Medical History:  Diagnosis Date   Anemia 10/02/2021   Non compliance w medication regimen    Schizophrenia (HCC)     Past Surgical History:  Procedure Laterality  Date   BIOPSY  09/25/2021   Procedure: BIOPSY;  Surgeon: Teressa Toribio SQUIBB, MD;  Location: [facility];  Service: Gastroenterology;;   ESOPHAGOGASTRODUODENOSCOPY (EGD) WITH PROPOFOL  [name] 09/25/2021   Procedure: ESOPHAGOGASTRODUODENOSCOPY (EGD) WITH PROPOFOL ;  Surgeon: Teressa Toribio SQUIBB, MD;  Location: [facility];  Service: Gastroenterology;  Laterality: [name];  With NGT placement    Current Medications: Current Facility-Administered Medications  Medication Dose Route Frequency Provider Last Rate Last Admin   acetaminophen  (TYLENOL )  tablet 650 mg  650 mg Oral Q6H PRN Starkes-Perry, Takia S, FNP       alum & mag hydroxide-simeth (MAALOX/MYLANTA) 200-200-20 MG/5ML suspension 30 mL  30 mL Oral Q4H PRN Starkes-Perry, Takia S, FNP       HYDROcodone -acetaminophen  (NORCO/VICODIN) 5-325 MG per tablet 1-2 tablet  1-2 tablet Oral Q4H PRN Starkes-Perry, Takia S, FNP       magnesium  hydroxide (MILK OF MAGNESIA) suspension 30 mL  30 mL Oral Daily PRN Starkes-Perry, Takia S, FNP       multivitamin with minerals tablet 1 tablet  1 tablet Oral Daily Wilkie Majel RAMAN, FNP   1 tablet at 06/10/24 0947   OLANZapine  (ZYPREXA ) injection 5 mg  5 mg Intramuscular TID PRN Wilkie Majel RAMAN, FNP       OLANZapine  (ZYPREXA ) tablet 15 mg  15 mg Oral BID Jadapalle, Sree, MD   15 mg at 06/10/24 9052   OLANZapine  zydis (ZYPREXA ) disintegrating tablet 5 mg  5 mg Oral TID PRN Starkes-Perry, Takia S, FNP       ondansetron  (ZOFRAN ) tablet 4 mg  4 mg Oral Q6H PRN Wilkie Majel RAMAN, FNP       Or   ondansetron  (ZOFRAN ) injection 4 mg  4 mg Intravenous Q6H PRN Starkes-Perry, Majel RAMAN, FNP       thiamine  (VITAMIN B1) tablet 100 mg  100 mg Oral Daily Wilkie Majel RAMAN, FNP   100 mg at 06/10/24 0947   traZODone  (DESYREL ) tablet 50 mg  50 mg Oral QHS Starkes-Perry, Takia S, FNP   50 mg at 06/09/24 2105    Lab Results:  No results found for this or any previous visit (from the past 48 hours).   Blood Alcohol level:  Lab Results  Component Value Date   [name] <15 05/02/2024   ETH <15 05/01/2024    Metabolic Disorder Labs: Lab Results  Component Value Date   HGBA1C 5.6 05/14/2024   MPG 114.02 05/14/2024   MPG 116.89 05/02/2024   Lab Results  Component Value Date   PROLACTIN 13.6 04/16/2015   Lab Results  Component Value Date   CHOL 219 (H) 05/14/2024   TRIG 84 05/14/2024   HDL 83 05/14/2024   CHOLHDL 2.6 05/14/2024   VLDL 17 05/14/2024   LDLCALC 119 (H) 05/14/2024   LDLCALC 130 (H) 05/01/2024    Physical Findings: AIMS:  , ,   ,  ,    CIWA:    COWS:      Psychiatric Specialty Exam:  Presentation  General Appearance:  Casual  Eye Contact: Fair  Speech: Normal rate  Speech Volume: Increased    Mood and Affect  Mood: Angry  Affect: Congruent   Thought Process  Thought Processes: Disorganized  Descriptions of Associations:Tangential  Orientation:Partial  Thought Content:Illogical; Tangential  Hallucinations:RESPONDING TO INTERNAL STIMULI Ideas of Reference:Paranoia; Delusions; Percusatory  Suicidal Thoughts:not endorsing Homicidal Thoughts:nor endorsing  Sensorium  Memory: Recent Poor  Judgment: Poor  Insight: Poor   Executive Functions  Concentration: Poor  Attention Span:  Poor  Recall: Poor  Fund of Knowledge: Poor  Language: Poor   Psychomotor Activity  Psychomotor Activity:No data recorded Musculoskeletal: Strength & Muscle Tone: within normal limits Gait & Station: normal Assets  Assets: Resilience    Physical Exam: Physical Exam Vitals and nursing note reviewed.    ROS Blood pressure 108/61, pulse (!) 53, temperature 97.9 F (36.6 C), resp. rate 16, height 5' 4 (1.626 m), weight 44.9 kg, SpO2 100%. Body mass index is 16.99 kg/m.  Diagnosis: Active Problems:   Schizophrenia, paranoid (HCC) Paranoid  PLAN: Safety and Monitoring:  -- Involuntary admission to inpatient psychiatric unit for safety, stabilization and treatment  -- Daily contact with patient to assess and evaluate symptoms and progress in treatment  -- Patient's case to be discussed in multi-disciplinary team meeting  -- Observation Level : q15 minute checks  -- Vital signs:  q12 hours  -- Precautions: suicide, elopement, and assault -- Encouraged patient to participate in unit milieu and in scheduled group therapies  2. Psychiatric Diagnoses and Treatment: Patient  2 physicians approved non emergent forced medications-Dr.Tarl Cephas and Dr.Jadapalle  Zyprexa  15 mg BID  oral or Im -non emergent forced medication-patient is taking her oral medication Will add second antipsychotic haldol   to help with the residual psychotic symptoms like Prolixin  once consent is received.-Waiting to hear from DSS caseworker for consent as brother already consented . Discharge Planning:   -- Social work and case management to assist with discharge planning and identification of [name] follow-up needs prior to discharge  -- Estimated LOS: 3-4 days  Millie JONELLE Manners, MD 06/10/2024, 1:03 PM

## 2024-06-11 NOTE — Plan of Care (Signed)
   Problem: Education: Goal: Knowledge of [facility] Education information/materials will improve Outcome: Progressing Goal: Emotional status will improve Outcome: Progressing Goal: Mental status will improve Outcome: Progressing

## 2024-06-11 NOTE — Group Note (Signed)
 [facility] LCSW Group Therapy Note    Group Date: 06/11/2024 Start Time: 1000 End Time: 1030  Type of Therapy and Topic:  Group Therapy:  Overcoming Obstacles  Participation Level:  [facility] PARTICIPATION LEVEL: Did Not Attend  Mood:  Description of Group:   In this group patients will be encouraged to explore what they see as obstacles to their own wellness and recovery. They will be guided to discuss their thoughts, feelings, and behaviors related to these obstacles. The group will process together ways to cope with barriers, with attention given to specific choices patients can make. Each patient will be challenged to identify changes they are motivated to make in order to overcome their obstacles. This group will be process-oriented, with patients participating in exploration of their own experiences as well as giving and receiving support and challenge from other group members.  Therapeutic Goals: 1. Patient will identify personal and current obstacles as they relate to admission. 2. Patient will identify barriers that currently interfere with their wellness or overcoming obstacles.  3. Patient will identify feelings, thought process and behaviors related to these barriers. 4. Patient will identify two changes they are willing to make to overcome these obstacles:    Summary of Patient Progress   Pt did not attend group.   Therapeutic Modalities:   Cognitive Behavioral Therapy Solution Focused Therapy Motivational Interviewing Relapse Prevention Therapy   Rexene LELON Mae, LCSWA

## 2024-06-11 NOTE — Progress Notes (Signed)
 Pt is disoriented to situation. Denies SI/HI/AVH at this time. She is isolative to her bedroom and minimal in interaction. She denies pain. She is med compliant and safe on the unit at this time with Q15 min safety checks in place.

## 2024-06-11 NOTE — Group Note (Signed)
 Date:  06/11/2024 Time:  8:52 PM  Group Topic/Focus:  Wrap-Up Group:   The focus of this group is to help patients review their daily goal of treatment and discuss progress on daily workbooks.    Participation Level:  Did Not Attend  Participation Quality:     Affect:     Cognitive:     Insight: None  Engagement in Group:  None  Modes of Intervention:     Additional Comments:    [name] [name] 06/11/2024, 8:52 PM

## 2024-06-11 NOTE — Group Note (Addendum)
 [facility] LCSW Group Therapy Note    Group Date: 06/11/2024 Start Time: 1000 End Time: 1030  Type of Therapy and Topic:  Group Therapy:  Overcoming Obstacles  Participation Level:  [facility] PARTICIPATION LEVEL: Did Not Attend  Mood:  Description of Group:   In this group patients will be encouraged to explore what they see as obstacles to their own wellness and recovery. They will be guided to discuss their thoughts, feelings, and behaviors related to these obstacles. The group will process together ways to cope with barriers, with attention given to specific choices patients can make. Each patient will be challenged to identify changes they are motivated to make in order to overcome their obstacles. This group will be process-oriented, with patients participating in exploration of their own experiences as well as giving and receiving support and challenge from other group members.  Therapeutic Goals: 1. Patient will identify personal and current obstacles as they relate to admission. 2. Patient will identify barriers that currently interfere with their wellness or overcoming obstacles.  3. Patient will identify feelings, thought process and behaviors related to these barriers. 4. Patient will identify two changes they are willing to make to overcome these obstacles:    Summary of Patient Progress   Pt did not attend group.   Therapeutic Modalities:   Cognitive Behavioral Therapy Solution Focused Therapy Motivational Interviewing Relapse Prevention Therapy   Rexene LELON Mae, LCSWA

## 2024-06-11 NOTE — Progress Notes (Signed)
   06/11/24 1100  Psych Admission Type (Psych Patients Only)  Admission Status Involuntary  Psychosocial Assessment  Patient Complaints None  Eye Contact Brief  Facial Expression Flat  Affect Flat  Speech Soft  Interaction Isolative  Motor Activity Slow  Appearance/Hygiene In scrubs  Behavior Characteristics Guarded  Mood Preoccupied  Thought Process  Coherency Disorganized  Content Preoccupation  Delusions None reported or observed  Perception WDL  Hallucination None reported or observed  Judgment Impaired  Confusion Mild  Danger to Self  Current suicidal ideation? Denies  Self-Injurious Behavior No self-injurious ideation or behavior indicators observed or expressed   Agreement Not to Harm Self Yes  Danger to Others  Danger to Others None reported or observed

## 2024-06-11 NOTE — Group Note (Signed)
 Date:  06/11/2024 Time:  4:06 PM  Group Topic/Focus:  Managing Feelings:   The focus of this group is to identify what feelings patients have difficulty handling and develop a plan to handle them in a healthier way upon discharge.    Participation Level:  Did Not Attend   [name] 06/11/2024, 4:06 PM

## 2024-06-11 NOTE — Plan of Care (Signed)

## 2024-06-12 NOTE — BH IP Treatment Plan (Signed)
 Interdisciplinary Treatment and Diagnostic Plan Update  06/12/2024 Time of Session: 9:00 AM [name] MRN: [id number]  Principal Diagnosis: <principal problem not specified>  Secondary Diagnoses: Active Problems:   Schizophrenia, paranoid (HCC)   Current Medications:  Current Facility-Administered Medications  Medication Dose Route Frequency Provider Last Rate Last Admin   acetaminophen  (TYLENOL ) tablet 650 mg  650 mg Oral Q6H PRN Starkes-Perry, Takia S, FNP       alum & mag hydroxide-simeth (MAALOX/MYLANTA) 200-200-20 MG/5ML suspension 30 mL  30 mL Oral Q4H PRN Starkes-Perry, Takia S, FNP       HYDROcodone -acetaminophen  (NORCO/VICODIN) 5-325 MG per tablet 1-2 tablet  1-2 tablet Oral Q4H PRN Starkes-Perry, Takia S, FNP       magnesium  hydroxide (MILK OF MAGNESIA) suspension 30 mL  30 mL Oral Daily PRN Starkes-Perry, Takia S, FNP       multivitamin with minerals tablet 1 tablet  1 tablet Oral Daily Starkes-Perry, Takia S, FNP   1 tablet at 06/12/24 1011   OLANZapine  (ZYPREXA ) injection 5 mg  5 mg Intramuscular TID PRN Wilkie Majel RAMAN, FNP       OLANZapine  (ZYPREXA ) tablet 15 mg  15 mg Oral BID Jadapalle, Sree, MD   15 mg at 06/12/24 1010   OLANZapine  zydis (ZYPREXA ) disintegrating tablet 5 mg  5 mg Oral TID PRN Starkes-Perry, Takia S, FNP       ondansetron  (ZOFRAN ) tablet 4 mg  4 mg Oral Q6H PRN Starkes-Perry, Majel RAMAN, FNP       Or   ondansetron  (ZOFRAN ) injection 4 mg  4 mg Intravenous Q6H PRN Starkes-Perry, Majel RAMAN, FNP       thiamine  (VITAMIN B1) tablet 100 mg  100 mg Oral Daily Wilkie Majel RAMAN, FNP   100 mg at 06/12/24 1011   traZODone  (DESYREL ) tablet 50 mg  50 mg Oral QHS Starkes-Perry, Takia S, FNP   50 mg at 06/11/24 2203   PTA Medications: Medications Prior to Admission  Medication Sig Dispense Refill Last Dose/Taking   feeding supplement (ENSURE PLUS HIGH PROTEIN) LIQD Take 237 mLs by mouth 3 (three) times daily between meals.      Multiple Vitamin  (MULTIVITAMIN WITH MINERALS) TABS tablet Take 1 tablet by mouth daily.      OLANZapine  (ZYPREXA ) 5 MG tablet Take 1 tablet (5 mg total) by mouth 2 (two) times daily.      OLANZapine  (ZYPREXA ) injection Inject 1 mL (5 mg total) into the muscle 2 (two) times daily.      thiamine  (VITAMIN B-1) 100 MG tablet Take 1 tablet (100 mg total) by mouth daily.      traZODone  (DESYREL ) 50 MG tablet Take 1 tablet (50 mg total) by mouth at bedtime.       Patient Stressors:    Patient Strengths:    Treatment Modalities: Medication Management, Group therapy, Case management,  1 to 1 session with clinician, Psychoeducation, Recreational therapy.   Physician Treatment Plan for Primary Diagnosis: <principal problem not specified> Long Term Goal(s): Improvement in symptoms so as ready for discharge   Short Term Goals: Ability to identify changes in lifestyle to reduce recurrence of condition will improve  Medication Management: Evaluate patient's response, side effects, and tolerance of medication regimen.  Therapeutic Interventions: 1 to 1 sessions, Unit Group sessions and Medication administration.  Evaluation of Outcomes: Not Progressing  Physician Treatment Plan for Secondary Diagnosis: Active Problems:   Schizophrenia, paranoid (HCC)  Long Term Goal(s): Improvement in symptoms so as ready for discharge  Short Term Goals: Ability to identify changes in lifestyle to reduce recurrence of condition will improve     Medication Management: Evaluate patient's response, side effects, and tolerance of medication regimen.  Therapeutic Interventions: 1 to 1 sessions, Unit Group sessions and Medication administration.  Evaluation of Outcomes: Not Progressing   RN Treatment Plan for Primary Diagnosis: <principal problem not specified> Long Term Goal(s): Knowledge of disease and therapeutic regimen to maintain health will improve  Short Term Goals: Ability to verbalize frustration and anger  appropriately will improve, Ability to demonstrate self-control, Ability to participate in decision making will improve, Ability to verbalize feelings will improve, Ability to disclose and discuss suicidal ideas, Ability to identify and develop effective coping behaviors will improve, and Compliance with prescribed medications will improve  Medication Management: RN will administer medications as ordered by provider, will assess and evaluate patient's response and provide education to patient for prescribed medication. RN will report any adverse and/or side effects to prescribing provider.  Therapeutic Interventions: 1 on 1 counseling sessions, Psychoeducation, Medication administration, Evaluate responses to treatment, Monitor vital signs and CBGs as ordered, Perform/monitor CIWA, COWS, AIMS and Fall Risk screenings as ordered, Perform wound care treatments as ordered.  Evaluation of Outcomes: Not Progressing   LCSW Treatment Plan for Primary Diagnosis: <principal problem not specified> Long Term Goal(s): Safe transition to appropriate next level of care at discharge, Engage patient in therapeutic group addressing interpersonal concerns.  Short Term Goals: Engage patient in aftercare planning with referrals and resources, Increase social support, Increase ability to appropriately verbalize feelings, Increase emotional regulation, Facilitate acceptance of mental health diagnosis and concerns, Facilitate patient progression through stages of change regarding substance use diagnoses and concerns, Identify triggers associated with mental health/substance abuse issues, and Increase skills for wellness and recovery  Therapeutic Interventions: Assess for all discharge needs, 1 to 1 time with Social worker, Explore available resources and support systems, Assess for adequacy in community support network, Educate family and significant other(s) on suicide prevention, Complete Psychosocial Assessment,  Interpersonal group therapy.  Evaluation of Outcomes: Not Progressing   Progress in Treatment: Attending groups: No. 06/12/24 Update: No.  Participating in groups: No. 06/12/24 Update: No.  Taking medication as prescribed: Yes. 06/12/24 Update: Yes.  Toleration medication: Yes. 06/12/24 Update: Yes. Family/Significant other contact made: No, will contact:  legal guardian 06/12/24 Update: No, will contact: Legal guardian  Patient understands diagnosis: No. 06/12/24 Update: No.  Discussing patient identified problems/goals with staff: Yes. 06/12/24 Update: Yes. Medical problems stabilized or resolved: Yes. 06/12/24 Update: Yes. Denies suicidal/homicidal ideation: Yes. 06/12/24 Update: Yes. Issues/concerns per patient self-inventory: No. 06/12/24 Update: No.  Other: 06/12/24 Update: None.    New problem(s) identified: No, Describe:  None identified. Update: 05/13/2024 No changes at this time. Update 05/18/2024:  No changes at this time. Update 05/23/2024:  No changes at this time. Update 05/28/2024:  No changes at this time. Update 06/02/2024:  No changes at this time. Update 06/07/2024:  No changes at this time. 06/12/24 Update: Patient has been isolative to her room with minimal interaction.     New Short Term/Long Term Goal(s): elimination of symptoms of psychosis, medication management for mood stabilization; elimination of SI thoughts; development of comprehensive mental wellness plan. Update: 05/13/2024 No changes at this time.Update: 05/18/2024 No changes at this time. Update 05/23/2024:  No changes at this time. Update 05/28/2024:  No changes at this time. Update 06/02/2024:  No changes at this time. Update 06/07/2024:  No changes at this time.  06/12/24 Update: No changes at this time.        Patient Goals:  I just need to leave, I don't need it hereUpdate: 05/13/2024 No changes at this time.Update: 05/18/2024 No changes at this time. Update 05/23/2024:  No changes at this time.Update  05/28/2024:  No changes at this time. Update 06/02/2024:  No changes at this time. Update 06/07/2024:  No changes at this time. 06/12/24 Update: No changes at this time.   Discharge Plan or Barriers:  Discharge Plan or Barriers: CSW will assist with appropriate discharge planning. Update: 05/13/2024 No changes at this time.Update: 05/18/2024 No changes at this time. Update 05/23/2024:  No changes at this time.Update 05/28/2024:  CSW unable to identify appropriate decision maker, as pt's legal guardian has passed and CSW has reached out to APS caseworker multiple times to get clarity on protocol for enforcing medication until an appropriate party has been legal identified Update 06/02/2024:  No changes at this time. Update 06/07/2024:  CSW and provider still working to clarify appropriate horticulturist, commercial. Please refer to extensive notes in medical record. 06/12/24 Update: CSW and provider still working with DSS to obtain legal decision maker on patient's behalf.   Reason for Continuation of Hospitalization: Aggression Hallucinations Medication stabilization 06/12/24 Update: No changes at this time.    Estimated Length of Stay: 1 to 7 days. Update: 05/13/2024 TBDUpdate: 05/18/2024 TBD Update 05/23/2024:  TBD Update 05/28/2024:  TBDUpdate 06/02/2024:  TBD Update 06/07/2024:  TBD 06/12/24 Update: TBD.     Last 3 [place] Suicide Severity Risk Score: Flowsheet Row Admission (Current) from 05/06/2024 in [facility] ED to Hosp-Admission (Discharged) from 05/02/2024 in [facility] ED from 04/30/2024 in [facility]  C-SSRS RISK CATEGORY No Risk No Risk No Risk    Last [facility] 2/9 Scores:     No data to display          Scribe for Treatment Team: Alveta CHRISTELLA Ezzard KEN 06/12/2024 12:44 PM

## 2024-06-12 NOTE — Progress Notes (Signed)
 Phone call back [facility] MD Progress Note  06/12/2024 12:59 PM [name]  MRN:  [id number]  [name] is a 63 y.o. female admitted: Medicallyfor 05/02/2024 10:56 AM for dehydration, failure to thrive and hypoglycemia. She carries the psychiatric diagnoses of Schizophrenia and has medical hx of hypoglycemia and failure to thrive and has a past medical history of sinus bradycardia, chronic anemia, severe malnutrition and recurrent hypoglycemia.. Her current presentation of verbal aggression, agitation and refusal to eat with severe paranoia is most consistent with paranoid schizophrenia. [name]  She was at [facility] where she was admitted on 04/30/2024.  She was transferred to the ED because of failure to thrive and was admitted to medical floor.Patient is on IVC and patient has been adamantly refusing all oral medications and refusing to eat.  Patient is admitted to [facility] unit with Q15 min safety monitoring. Multidisciplinary team approach is offered. Medication management; group/milieu therapy is offered.    Subjective:  Chart reviewed, case discussed in multidisciplinary meeting, patient seen during rounds.   Patient is noted to be standing in her room.  She remains delusional and superficially engaging.  She remains irritable towards the provider especially when questions related her mental health are asked.  She denies SI/HI and makes statements that she is not a kid and she knows how to take care of herself.  She denies auditory hallucinations but remains delusional and paranoid, refusing to engage in meaningful interview with the provider.   Past Psychiatric History: see h&P Family History: History reviewed. No pertinent family history. Social History:  Social History   Substance and Sexual Activity  Alcohol Use Not Currently   Comment: Refuses to disclose how much     Social History   Substance and Sexual Activity  Drug Use No   Comment: Refuses to answer    Social History    Socioeconomic History   Marital status: Single    Spouse name: Not on file   Number of children: Not on file   Years of education: Not on file   Highest education level: Not on file  Occupational History   Not on file  Tobacco Use   Smoking status: Every Day    Current packs/day: 2.00    Types: Cigarettes    Passive exposure: Current   Smokeless tobacco: Never  Vaping Use   Vaping status: Never Used  Substance and Sexual Activity   Alcohol use: Not Currently    Comment: Refuses to disclose how much   Drug use: No    Comment: Refuses to answer   Sexual activity: Not Currently    Comment: refused to answer  Other Topics Concern   Not on file  Social History Narrative   Not on file   Social Drivers of Health   Financial Resource Strain: Not on file  Food Insecurity: Patient Declined (05/06/2024)   Hunger Vital Sign    Worried About Running Out of Food in the Last Year: Patient declined    Ran Out of Food in the Last Year: Patient declined  Transportation Needs: Patient Declined (05/06/2024)   PRAPARE - Administrator, Civil Service (Medical): Patient declined    Lack of Transportation (Non-Medical): Patient declined  Physical Activity: Not on file  Stress: Not on file  Social Connections: Not on file   Past Medical History:  Past Medical History:  Diagnosis Date   Anemia 10/02/2021   Non compliance w medication regimen    Schizophrenia (HCC)  Past Surgical History:  Procedure Laterality Date   BIOPSY  09/25/2021   Procedure: BIOPSY;  Surgeon: Teressa Toribio SQUIBB, MD;  Location: [facility];  Service: Gastroenterology;;   ESOPHAGOGASTRODUODENOSCOPY (EGD) WITH PROPOFOL  [name] 09/25/2021   Procedure: ESOPHAGOGASTRODUODENOSCOPY (EGD) WITH PROPOFOL ;  Surgeon: Teressa Toribio SQUIBB, MD;  Location: [facility];  Service: Gastroenterology;  Laterality: [name];  With NGT placement    Current Medications: Current Facility-Administered Medications  Medication Dose Route  Frequency Provider Last Rate Last Admin   acetaminophen  (TYLENOL ) tablet 650 mg  650 mg Oral Q6H PRN Starkes-Perry, Takia S, FNP       alum & mag hydroxide-simeth (MAALOX/MYLANTA) 200-200-20 MG/5ML suspension 30 mL  30 mL Oral Q4H PRN Starkes-Perry, Takia S, FNP       HYDROcodone -acetaminophen  (NORCO/VICODIN) 5-325 MG per tablet 1-2 tablet  1-2 tablet Oral Q4H PRN Starkes-Perry, Takia S, FNP       magnesium  hydroxide (MILK OF MAGNESIA) suspension 30 mL  30 mL Oral Daily PRN Starkes-Perry, Takia S, FNP       multivitamin with minerals tablet 1 tablet  1 tablet Oral Daily Wilkie Majel RAMAN, FNP   1 tablet at 06/12/24 1011   OLANZapine  (ZYPREXA ) injection 5 mg  5 mg Intramuscular TID PRN Wilkie Majel RAMAN, FNP       OLANZapine  (ZYPREXA ) tablet 15 mg  15 mg Oral BID Kathyleen Radice, MD   15 mg at 06/12/24 1010   OLANZapine  zydis (ZYPREXA ) disintegrating tablet 5 mg  5 mg Oral TID PRN Starkes-Perry, Takia S, FNP       ondansetron  (ZOFRAN ) tablet 4 mg  4 mg Oral Q6H PRN Starkes-Perry, Majel RAMAN, FNP       Or   ondansetron  (ZOFRAN ) injection 4 mg  4 mg Intravenous Q6H PRN Starkes-Perry, Majel RAMAN, FNP       thiamine  (VITAMIN B1) tablet 100 mg  100 mg Oral Daily Wilkie Majel RAMAN, FNP   100 mg at 06/12/24 1011   traZODone  (DESYREL ) tablet 50 mg  50 mg Oral QHS Starkes-Perry, Takia S, FNP   50 mg at 06/11/24 2203    Lab Results:  No results found for this or any previous visit (from the past 48 hours).   Blood Alcohol level:  Lab Results  Component Value Date   [facility] <15 05/02/2024   ETH <15 05/01/2024    Metabolic Disorder Labs: Lab Results  Component Value Date   HGBA1C 5.6 05/14/2024   MPG 114.02 05/14/2024   MPG 116.89 05/02/2024   Lab Results  Component Value Date   PROLACTIN 13.6 04/16/2015   Lab Results  Component Value Date   CHOL 219 (H) 05/14/2024   TRIG 84 05/14/2024   HDL 83 05/14/2024   CHOLHDL 2.6 05/14/2024   VLDL 17 05/14/2024   LDLCALC 119 (H) 05/14/2024    LDLCALC 130 (H) 05/01/2024    Physical Findings: AIMS:  , ,  ,  ,    CIWA:    COWS:      Psychiatric Specialty Exam:  Presentation  General Appearance:  Casual  Eye Contact: Fair  Speech: Normal rate  Speech Volume: Increased    Mood and Affect  Mood: Angry  Affect: Congruent   Thought Process  Thought Processes: Disorganized  Descriptions of Associations:Tangential  Orientation:Partial  Thought Content:Illogical; Tangential  Hallucinations:RESPONDING TO INTERNAL STIMULI Ideas of Reference:Paranoia; Delusions; Percusatory  Suicidal Thoughts:not endorsing Homicidal Thoughts:nor endorsing  Sensorium  Memory: Recent Poor  Judgment: Poor  Insight: Poor   Art Therapist  Concentration: Poor  Attention Span: Poor  Recall: Poor  Fund of Knowledge: Poor  Language: Poor   Psychomotor Activity  Psychomotor Activity:No data recorded Musculoskeletal: Strength & Muscle Tone: within normal limits Gait & Station: normal Assets  Assets: Resilience    Physical Exam: Physical Exam Vitals and nursing note reviewed.    ROS Blood pressure (!) 101/41, pulse (!) 53, temperature (!) 97.5 F (36.4 C), resp. rate 16, height 5' 4 (1.626 m), weight 44.9 kg, SpO2 100%. Body mass index is 16.99 kg/m.  Diagnosis: Active Problems:   Schizophrenia, paranoid (HCC) Paranoid  PLAN: Safety and Monitoring:  -- Involuntary admission to inpatient psychiatric unit for safety, stabilization and treatment  -- Daily contact with patient to assess and evaluate symptoms and progress in treatment  -- Patient's case to be discussed in multi-disciplinary team meeting  -- Observation Level : q15 minute checks  -- Vital signs:  q12 hours  -- Precautions: suicide, elopement, and assault -- Encouraged patient to participate in unit milieu and in scheduled group therapies  2. Psychiatric Diagnoses and Treatment: Patient  2 physicians approved non  emergent forced medications-Dr.Madaram and Dr.Amayia Ciano  Zyprexa  15 mg BID oral or Im -non emergent forced medication-patient is taking her oral medication Will add second antipsychotic haldol   to help with the residual psychotic symptoms like Prolixin  once consent is received.-Waiting to hear from DSS caseworker for consent as brother already consented . Discharge Planning:   -- Social work and case management to assist with discharge planning and identification of hospital follow-up needs prior to discharge  -- Estimated LOS: 3-4 days  Allyn Foil, MD 06/12/2024, 12:59 PM

## 2024-06-12 NOTE — Progress Notes (Signed)
   06/12/24 1200  Psych Admission Type (Psych Patients Only)  Admission Status Involuntary  Psychosocial Assessment  Patient Complaints None  Eye Contact Brief  Facial Expression Flat  Affect Flat  Speech Soft  Interaction Isolative  Motor Activity Slow  Appearance/Hygiene In scrubs  Behavior Characteristics Guarded  Mood Preoccupied  Thought Process  Coherency Concrete thinking  Content Preoccupation  Delusions None reported or observed  Perception WDL  Hallucination None reported or observed  Judgment Impaired  Confusion Mild  Danger to Self  Current suicidal ideation? Denies  Agreement Not to Harm Self Yes  Description of Agreement verbal  Danger to Others  Danger to Others None reported or observed

## 2024-06-12 NOTE — Plan of Care (Signed)
   Problem: Education: Goal: Emotional status will improve Outcome: Progressing Goal: Mental status will improve Outcome: Progressing

## 2024-06-12 NOTE — Progress Notes (Signed)
   06/12/24 2000  Psych Admission Type (Psych Patients Only)  Admission Status Involuntary  Psychosocial Assessment  Patient Complaints None  Eye Contact Brief  Facial Expression Flat  Affect Flat  Speech Soft  Interaction Isolative  Motor Activity Slow  Appearance/Hygiene In scrubs  Behavior Characteristics Guarded  Mood Preoccupied  Thought Process  Coherency Concrete thinking  Content Preoccupation  Delusions None reported or observed  Perception WDL  Hallucination None reported or observed  Judgment Impaired  Confusion Mild  Danger to Self  Current suicidal ideation? Denies  Agreement Not to Harm Self Yes  Description of Agreement verbal  Danger to Others  Danger to Others None reported or observed

## 2024-06-12 NOTE — Plan of Care (Signed)
  Problem: Education: Goal: Emotional status will improve Outcome: Not Progressing Goal: Verbalization of understanding the information provided will improve Outcome: Not Progressing

## 2024-06-12 NOTE — Progress Notes (Signed)
 Phone call back [facility] MD Progress Note  06/11/2024 9:54 AM Sahiba Granholm  MRN:  [id number]  Kelsye Loomer is a 63 y.o. female admitted: Medicallyfor 05/02/2024 10:56 AM for dehydration, failure to thrive and hypoglycemia. She carries the psychiatric diagnoses of Schizophrenia and has medical hx of hypoglycemia and failure to thrive and has a past medical history of sinus bradycardia, chronic anemia, severe malnutrition and recurrent hypoglycemia.. Her current presentation of verbal aggression, agitation and refusal to eat with severe paranoia is most consistent with paranoid schizophrenia. [name]  She was at [facility] where she was admitted on 04/30/2024.  She was transferred to the ED because of failure to thrive and was admitted to medical floor.Patient is on IVC and patient has been adamantly refusing all oral medications and refusing to eat.  Patient is admitted to [facility] unit with Q15 min safety monitoring. Multidisciplinary team approach is offered. Medication management; group/milieu therapy is offered.    Subjective:  Chart reviewed, case discussed in multidisciplinary meeting, patient seen during rounds.   No changes continues to be at baseline  Patient is irritable stays in her room internally preoccupied no aggressive behaviors not engaging denies any suicidal homicidal thoughts.   Past Psychiatric History: see h&P Family History: History reviewed. No pertinent family history. Social History:  Social History   Substance and Sexual Activity  Alcohol Use Not Currently   Comment: Refuses to disclose how much     Social History   Substance and Sexual Activity  Drug Use No   Comment: Refuses to answer    Social History   Socioeconomic History   Marital status: Single    Spouse name: Not on file   Number of children: Not on file   Years of education: Not on file   Highest education level: Not on file  Occupational History   Not on file  Tobacco Use   Smoking status: Every Day     Current packs/day: 2.00    Types: Cigarettes    Passive exposure: Current   Smokeless tobacco: Never  Vaping Use   Vaping status: Never Used  Substance and Sexual Activity   Alcohol use: Not Currently    Comment: Refuses to disclose how much   Drug use: No    Comment: Refuses to answer   Sexual activity: Not Currently    Comment: refused to answer  Other Topics Concern   Not on file  Social History Narrative   Not on file   Social Drivers of Health   Financial Resource Strain: Not on file  Food Insecurity: Patient Declined (05/06/2024)   Hunger Vital Sign    Worried About Running Out of Food in the Last Year: Patient declined    Ran Out of Food in the Last Year: Patient declined  Transportation Needs: Patient Declined (05/06/2024)   PRAPARE - Administrator, Civil Service (Medical): Patient declined    Lack of Transportation (Non-Medical): Patient declined  Physical Activity: Not on file  Stress: Not on file  Social Connections: Not on file   Past Medical History:  Past Medical History:  Diagnosis Date   Anemia 10/02/2021   Non compliance w medication regimen    Schizophrenia (HCC)     Past Surgical History:  Procedure Laterality Date   BIOPSY  09/25/2021   Procedure: BIOPSY;  Surgeon: Teressa Toribio SQUIBB, MD;  Location: [facility];  Service: Gastroenterology;;   ESOPHAGOGASTRODUODENOSCOPY (EGD) WITH PROPOFOL  [name] 09/25/2021   Procedure: ESOPHAGOGASTRODUODENOSCOPY (EGD) WITH PROPOFOL ;  Surgeon: Teressa Toribio SQUIBB, MD;  Location: [facility];  Service: Gastroenterology;  Laterality: [name];  With NGT placement    Current Medications: Current Facility-Administered Medications  Medication Dose Route Frequency Provider Last Rate Last Admin   acetaminophen  (TYLENOL ) tablet 650 mg  650 mg Oral Q6H PRN Starkes-Perry, Takia S, FNP       alum & mag hydroxide-simeth (MAALOX/MYLANTA) 200-200-20 MG/5ML suspension 30 mL  30 mL Oral Q4H PRN Starkes-Perry, Takia S, FNP        HYDROcodone -acetaminophen  (NORCO/VICODIN) 5-325 MG per tablet 1-2 tablet  1-2 tablet Oral Q4H PRN Starkes-Perry, Takia S, FNP       magnesium  hydroxide (MILK OF MAGNESIA) suspension 30 mL  30 mL Oral Daily PRN Starkes-Perry, Takia S, FNP       multivitamin with minerals tablet 1 tablet  1 tablet Oral Daily Wilkie Majel RAMAN, FNP   1 tablet at 06/11/24 9063   OLANZapine  (ZYPREXA ) injection 5 mg  5 mg Intramuscular TID PRN Wilkie Majel RAMAN, FNP       OLANZapine  (ZYPREXA ) tablet 15 mg  15 mg Oral BID Jadapalle, Sree, MD   15 mg at 06/11/24 2203   OLANZapine  zydis (ZYPREXA ) disintegrating tablet 5 mg  5 mg Oral TID PRN Starkes-Perry, Takia S, FNP       ondansetron  (ZOFRAN ) tablet 4 mg  4 mg Oral Q6H PRN Starkes-Perry, Majel RAMAN, FNP       Or   ondansetron  (ZOFRAN ) injection 4 mg  4 mg Intravenous Q6H PRN Starkes-Perry, Majel RAMAN, FNP       thiamine  (VITAMIN B1) tablet 100 mg  100 mg Oral Daily Wilkie Majel RAMAN, FNP   100 mg at 06/11/24 9063   traZODone  (DESYREL ) tablet 50 mg  50 mg Oral QHS Starkes-Perry, Takia S, FNP   50 mg at 06/11/24 2203    Lab Results:  No results found for this or any previous visit (from the past 48 hours).   Blood Alcohol level:  Lab Results  Component Value Date   [facility] <15 05/02/2024   ETH <15 05/01/2024    Metabolic Disorder Labs: Lab Results  Component Value Date   HGBA1C 5.6 05/14/2024   MPG 114.02 05/14/2024   MPG 116.89 05/02/2024   Lab Results  Component Value Date   PROLACTIN 13.6 04/16/2015   Lab Results  Component Value Date   CHOL 219 (H) 05/14/2024   TRIG 84 05/14/2024   HDL 83 05/14/2024   CHOLHDL 2.6 05/14/2024   VLDL 17 05/14/2024   LDLCALC 119 (H) 05/14/2024   LDLCALC 130 (H) 05/01/2024    Physical Findings: AIMS:  , ,  ,  ,    CIWA:    COWS:      Psychiatric Specialty Exam:  Presentation  General Appearance:  Casual  Eye Contact: Fair  Speech: Normal rate  Speech Volume: Increased    Mood and  Affect  Mood: Angry  Affect: Congruent   Thought Process  Thought Processes: Disorganized  Descriptions of Associations:Tangential  Orientation:Partial  Thought Content:Illogical; Tangential  Hallucinations:RESPONDING TO INTERNAL STIMULI Ideas of Reference:Paranoia; Delusions; Percusatory  Suicidal Thoughts:not endorsing Homicidal Thoughts:nor endorsing  Sensorium  Memory: Recent Poor  Judgment: Poor  Insight: Poor   Executive Functions  Concentration: Poor  Attention Span: Poor  Recall: Poor  Fund of Knowledge: Poor  Language: Poor   Psychomotor Activity  Psychomotor Activity:No data recorded Musculoskeletal: Strength & Muscle Tone: within normal limits Gait & Station: normal Assets  Assets: Resilience  Physical Exam: Physical Exam Vitals and nursing note reviewed.    ROS Blood pressure (!) 101/41, pulse (!) 53, temperature (!) 97.5 F (36.4 C), resp. rate 16, height 5' 4 (1.626 m), weight 44.9 kg, SpO2 100%. Body mass index is 16.99 kg/m.  Diagnosis: Active Problems:   Schizophrenia, paranoid (HCC) Paranoid  PLAN: Safety and Monitoring:  -- Involuntary admission to inpatient psychiatric unit for safety, stabilization and treatment  -- Daily contact with patient to assess and evaluate symptoms and progress in treatment  -- Patient's case to be discussed in multi-disciplinary team meeting  -- Observation Level : q15 minute checks  -- Vital signs:  q12 hours  -- Precautions: suicide, elopement, and assault -- Encouraged patient to participate in unit milieu and in scheduled group therapies  2. Psychiatric Diagnoses and Treatment: Patient  2 physicians approved non emergent forced medications-Dr.Eimy Plaza and Dr.Jadapalle  Zyprexa  15 mg BID oral or Im -non emergent forced medication-patient is taking her oral medication Will add second antipsychotic haldol   to help with the residual psychotic symptoms like Prolixin  once consent  is received.-Waiting to hear from DSS caseworker for consent as brother already consented . Discharge Planning:   -- Social work and case management to assist with discharge planning and identification of hospital follow-up needs prior to discharge  -- Estimated LOS: 3-4 days  Millie JONELLE Manners, MD 06/12/2024, 9:54 AM

## 2024-06-12 NOTE — Group Note (Signed)
 Date:  06/12/2024 Time:  10:34 PM  Group Topic/Focus:  Wrap-Up Group:   The focus of this group is to help patients review their daily goal of treatment and discuss progress on daily workbooks.    Participation Level:  Did Not Attend  Participation Quality:     Affect:     Cognitive:     Insight: None  Engagement in Group:  None  Modes of Intervention:     Additional Comments:    [name] 06/12/2024, 10:34 PM

## 2024-06-12 NOTE — Progress Notes (Signed)
   06/11/24 2300  Psych Admission Type (Psych Patients Only)  Admission Status Involuntary  Psychosocial Assessment  Patient Complaints None  Eye Contact Brief  Facial Expression Flat  Affect Flat  Speech Soft  Interaction Isolative  Motor Activity Slow  Appearance/Hygiene In scrubs  Behavior Characteristics Guarded  Mood Preoccupied  Thought Process  Coherency Blocking  Content Preoccupation  Delusions None reported or observed  Perception UTA  Hallucination UTA  Judgment Impaired  Confusion Mild  Danger to Self  Current suicidal ideation? Denies  Self-Injurious Behavior No self-injurious ideation or behavior indicators observed or expressed   Agreement Not to Harm Self Yes  Description of Agreement Verbal  Danger to Others  Danger to Others None reported or observed

## 2024-06-12 NOTE — Group Note (Signed)
 Date:  06/12/2024 Time:  10:46 AM  Group Topic/Focus:  Goals/Gratitude Group:   The focus of this group is to help patients establish daily goals to achieve during treatment and discuss how the patient can incorporate goal setting into their daily lives to aide in recovery. Also patients watched a video on gratitude and shared their thoughts on the video.    Participation Level:  Did Not Attend  [name] 06/12/2024, 10:46 AM

## 2024-06-12 NOTE — Group Note (Signed)
 Date:  06/12/2024 Time:  5:30 PM  Group Topic/Focus:  Managing Feelings:   The focus of this group is to identify what feelings patients have difficulty handling and develop a plan to handle them in a healthier way upon discharge.    Participation Level:  Did Not Attend  Participation Quality:    Affect:    Cognitive:    Insight:   Engagement in Group:    Modes of Intervention:    Additional Comments:    [name] 06/12/2024, 5:30 PM

## 2024-06-12 NOTE — Group Note (Signed)
 Recreation Therapy Group Note   Group Topic:Emotion Expression  Group Date: 06/12/2024 Start Time: 1510 End Time: 1615 Facilitators: Celestia Jeoffrey BRAVO, LRT, CTRS Location: Courtyard  Group Description: Music. Patients are encouraged to name their favorite song(s) for LRT to play song through speaker for group to hear, while in the courtyard getting fresh air and sunlight. Patients educated on the definition of leisure and the importance of having different leisure interests outside of the hospital. Group discussed how leisure activities can often be used as pharmacologist and that listening to music and being outside are examples.    Goal Area(s) Addressed:  Patient will identify a current leisure interest.  Patient will practice making a positive decision. Patient will have the opportunity to try a new leisure activity.   Affect/Mood: [name]   Participation Level: Did not attend    Clinical Observations/Individualized Feedback: Patient did not attend group.   Plan: Continue to engage patient in RT group sessions 2-3x/week.   Jeoffrey BRAVO Celestia, LRT, CTRS 06/12/2024 5:16 PM

## 2024-06-13 NOTE — Group Note (Signed)
 Date:  06/13/2024 Time:  11:14 AM  Group Topic/Focus:   Thanksgiving Gratitude  Gratitude helps us  slow down and see the good that's already here. Science shows that practicing gratitude can boost happiness, lower stress, and even strengthen relationships. But gratitude is more than positive thinking it's a way of shifting our perspective, one day at a time. Choosing gratitude every day is monumental in shaping the quality of your daily life for the better. But, gratitude grows best in community. Gratitude is powerful. It changes our perspective, softens our hearts, and helps us  see the good even when life feels heavy. Studies show that gratitude has measurable benefits, it boosts happiness, lowers stress, and strengthens relationships.   Participation Level:  Did Not Attend   Harlene LITTIE Gavel 06/13/2024, 11:14 AM

## 2024-06-13 NOTE — Group Note (Signed)
 Recreation Therapy Group Note   Group Topic:Stress Management  Group Date: 06/13/2024 Start Time: 1100 End Time: 1135 Facilitators: Celestia Jeoffrey BRAVO, LRT, CTRS Location: Dayroom  Group Description: PMR (Progressive Muscle Relaxation). LRT educates patients on what PMR is and the benefits that come from it. Patients are asked to sit with their feet flat on the floor while sitting up and all the way back in their chair, if possible. LRT and pts follow a prompt through a speaker that requires you to tense and release different muscles in their body and focus on their breathing. During session, lights are off and soft music is being played. Pts are given a stress ball to use if needed.   Goal Area(s) Addressed:  Patients will be able to describe progressive muscle relaxation.  Patient will practice using relaxation technique. Patient will identify a new coping skill.  Patient will follow multistep directions to reduce anxiety and stress.   Affect/Mood: [name]   Participation Level: Did not attend    Clinical Observations/Individualized Feedback: Patient did not attend group.   Plan: Continue to engage patient in RT group sessions 2-3x/week.   Jeoffrey BRAVO Celestia, LRT, CTRS 06/13/2024 1:37 PM

## 2024-06-13 NOTE — Group Note (Signed)
 LCSW Group Therapy Note  Group Date: 06/13/2024 Start Time: 1300 End Time: 1340   Type of Therapy and Topic:  Group Therapy - Healthy vs Unhealthy Coping Skills  Participation Level:  Did Not Attend   Description of Group The focus of this group was to determine what unhealthy coping techniques typically are used by group members and what healthy coping techniques would be helpful in coping with various problems. Patients were guided in becoming aware of the differences between healthy and unhealthy coping techniques. Patients were asked to identify 2-3 healthy coping skills they would like to learn to use more effectively.  Therapeutic Goals Patients learned that coping is what human beings do all day long to deal with various situations in their lives Patients defined and discussed healthy vs unhealthy coping techniques Patients identified their preferred coping techniques and identified whether these were healthy or unhealthy Patients determined 2-3 healthy coping skills they would like to become more familiar with and use more often. Patients provided support and ideas to each other   Summary of Patient Progress: X   Therapeutic Modalities Cognitive Behavioral Therapy Motivational Interviewing  [name], LCSWA 06/13/2024  2:20 PM

## 2024-06-13 NOTE — Progress Notes (Signed)
   06/13/24 2300  Psych Admission Type (Psych Patients Only)  Admission Status Involuntary  Psychosocial Assessment  Patient Complaints None  Eye Contact Avoids  Facial Expression Flat  Affect Flat  Speech Soft  Interaction Isolative  Motor Activity Slow  Appearance/Hygiene In scrubs  Behavior Characteristics Guarded  Mood Preoccupied  Thought Process  Coherency Disorganized  Content Preoccupation  Delusions None reported or observed  Perception WDL  Hallucination None reported or observed  Judgment Impaired  Confusion Mild  Danger to Self  Current suicidal ideation? Denies  Agreement Not to Harm Self Yes  Description of Agreement Verbal  Danger to Others  Danger to Others None reported or observed

## 2024-06-13 NOTE — Group Note (Signed)
 Date:  06/13/2024 Time:  9:11 PM  Group Topic/Focus:  Wrap-Up Group:   The focus of this group is to help patients review their daily goal of treatment and discuss progress on daily workbooks.    Participation Level:  Did Not Attend  Participation Quality:     Affect:     Cognitive:     Insight: None  Engagement in Group:     Modes of Intervention:     Additional Comments:    [name] 06/13/2024, 9:11 PM

## 2024-06-13 NOTE — Progress Notes (Signed)
 Phone call back [facility] MD Progress Note  06/13/2024 12:05 PM [name]  MRN:  [id number]  [name] is a 63 y.o. female admitted: Medicallyfor 05/02/2024 10:56 AM for dehydration, failure to thrive and hypoglycemia. She carries the psychiatric diagnoses of Schizophrenia and has medical hx of hypoglycemia and failure to thrive and has a past medical history of sinus bradycardia, chronic anemia, severe malnutrition and recurrent hypoglycemia.. Her current presentation of verbal aggression, agitation and refusal to eat with severe paranoia is most consistent with paranoid schizophrenia. [name]  She was at [facility] where she was admitted on 04/30/2024.  She was transferred to the ED because of failure to thrive and was admitted to medical floor.Patient is on IVC and patient has been adamantly refusing all oral medications and refusing to eat.  Patient is admitted to [facility] unit with Q15 min safety monitoring. Multidisciplinary team approach is offered. Medication management; group/milieu therapy is offered.    Subjective:  Chart reviewed, case discussed in multidisciplinary meeting, patient seen during rounds.    Patient is noted to be standing in her room and talking to herself.  She remains hostile and refused to respond to questions related to hallucinations and mood.  She reports she is not a baby and not to bother her.  She denies SI/HI/plan and denies auditory/visual hallucinations.  Per nursing staff patient is coming out for meals.  She is not participating in any groups.  APS is working on legal guardianship  Past Psychiatric History: see h&P Family History: History reviewed. No pertinent family history. Social History:  Social History   Substance and Sexual Activity  Alcohol Use Not Currently   Comment: Refuses to disclose how much     Social History   Substance and Sexual Activity  Drug Use No   Comment: Refuses to answer    Social History   Socioeconomic History   Marital status:  Single    Spouse name: Not on file   Number of children: Not on file   Years of education: Not on file   Highest education level: Not on file  Occupational History   Not on file  Tobacco Use   Smoking status: Every Day    Current packs/day: 2.00    Types: Cigarettes    Passive exposure: Current   Smokeless tobacco: Never  Vaping Use   Vaping status: Never Used  Substance and Sexual Activity   Alcohol use: Not Currently    Comment: Refuses to disclose how much   Drug use: No    Comment: Refuses to answer   Sexual activity: Not Currently    Comment: refused to answer  Other Topics Concern   Not on file  Social History Narrative   Not on file   Social Drivers of Health   Financial Resource Strain: Not on file  Food Insecurity: Patient Declined (05/06/2024)   Hunger Vital Sign    Worried About Running Out of Food in the Last Year: Patient declined    Ran Out of Food in the Last Year: Patient declined  Transportation Needs: Patient Declined (05/06/2024)   PRAPARE - Administrator, Civil Service (Medical): Patient declined    Lack of Transportation (Non-Medical): Patient declined  Physical Activity: Not on file  Stress: Not on file  Social Connections: Not on file   Past Medical History:  Past Medical History:  Diagnosis Date   Anemia 10/02/2021   Non compliance w medication regimen    Schizophrenia (HCC)  Past Surgical History:  Procedure Laterality Date   BIOPSY  09/25/2021   Procedure: BIOPSY;  Surgeon: Teressa Toribio SQUIBB, MD;  Location: [facility];  Service: Gastroenterology;;   ESOPHAGOGASTRODUODENOSCOPY (EGD) WITH PROPOFOL  [name] 09/25/2021   Procedure: ESOPHAGOGASTRODUODENOSCOPY (EGD) WITH PROPOFOL ;  Surgeon: Teressa Toribio SQUIBB, MD;  Location: [facility];  Service: Gastroenterology;  Laterality: [name];  With NGT placement    Current Medications: Current Facility-Administered Medications  Medication Dose Route Frequency Provider Last Rate Last Admin    acetaminophen  (TYLENOL ) tablet 650 mg  650 mg Oral Q6H PRN Starkes-Perry, Takia S, FNP       alum & mag hydroxide-simeth (MAALOX/MYLANTA) 200-200-20 MG/5ML suspension 30 mL  30 mL Oral Q4H PRN Starkes-Perry, Takia S, FNP       HYDROcodone -acetaminophen  (NORCO/VICODIN) 5-325 MG per tablet 1-2 tablet  1-2 tablet Oral Q4H PRN Starkes-Perry, Takia S, FNP       magnesium  hydroxide (MILK OF MAGNESIA) suspension 30 mL  30 mL Oral Daily PRN Starkes-Perry, Takia S, FNP       multivitamin with minerals tablet 1 tablet  1 tablet Oral Daily Wilkie Majel RAMAN, FNP   1 tablet at 06/13/24 9041   OLANZapine  (ZYPREXA ) injection 5 mg  5 mg Intramuscular TID PRN Wilkie Majel RAMAN, FNP       OLANZapine  (ZYPREXA ) tablet 15 mg  15 mg Oral BID Misao Fackrell, MD   15 mg at 06/13/24 9041   OLANZapine  zydis (ZYPREXA ) disintegrating tablet 5 mg  5 mg Oral TID PRN Starkes-Perry, Takia S, FNP       ondansetron  (ZOFRAN ) tablet 4 mg  4 mg Oral Q6H PRN Starkes-Perry, Majel RAMAN, FNP       Or   ondansetron  (ZOFRAN ) injection 4 mg  4 mg Intravenous Q6H PRN Starkes-Perry, Majel RAMAN, FNP       thiamine  (VITAMIN B1) tablet 100 mg  100 mg Oral Daily Starkes-Perry, Takia S, FNP   100 mg at 06/13/24 9041   traZODone  (DESYREL ) tablet 50 mg  50 mg Oral QHS Starkes-Perry, Takia S, FNP   50 mg at 06/12/24 2155    Lab Results:  No results found for this or any previous visit (from the past 48 hours).   Blood Alcohol level:  Lab Results  Component Value Date   [facility] <15 05/02/2024   ETH <15 05/01/2024    Metabolic Disorder Labs: Lab Results  Component Value Date   HGBA1C 5.6 05/14/2024   MPG 114.02 05/14/2024   MPG 116.89 05/02/2024   Lab Results  Component Value Date   PROLACTIN 13.6 04/16/2015   Lab Results  Component Value Date   CHOL 219 (H) 05/14/2024   TRIG 84 05/14/2024   HDL 83 05/14/2024   CHOLHDL 2.6 05/14/2024   VLDL 17 05/14/2024   LDLCALC 119 (H) 05/14/2024   LDLCALC 130 (H) 05/01/2024     Physical Findings: AIMS:  , ,  ,  ,    CIWA:    COWS:      Psychiatric Specialty Exam:  Presentation  General Appearance:  Casual  Eye Contact: Fair  Speech: Normal rate  Speech Volume: Increased    Mood and Affect  Mood: Angry  Affect: Congruent   Thought Process  Thought Processes: Disorganized  Descriptions of Associations:Tangential  Orientation:Partial  Thought Content:Illogical; Tangential  Hallucinations:RESPONDING TO INTERNAL STIMULI Ideas of Reference:Paranoia; Delusions; Percusatory  Suicidal Thoughts:not endorsing Homicidal Thoughts:nor endorsing  Sensorium  Memory: Recent Poor  Judgment: Poor  Insight: Poor   Art Therapist  Concentration: Poor  Attention Span: Poor  Recall: Poor  Fund of Knowledge: Poor  Language: Poor   Psychomotor Activity  Psychomotor Activity:No data recorded Musculoskeletal: Strength & Muscle Tone: within normal limits Gait & Station: normal Assets  Assets: Resilience    Physical Exam: Physical Exam Vitals and nursing note reviewed.    ROS Blood pressure (!) 101/55, pulse (!) 51, temperature (!) 97.3 F (36.3 C), resp. rate 14, height 5' 4 (1.626 m), weight 44.9 kg, SpO2 99%. Body mass index is 16.99 kg/m.  Diagnosis: Active Problems:   Schizophrenia, paranoid (HCC) Paranoid  PLAN: Safety and Monitoring:  -- Involuntary admission to inpatient psychiatric unit for safety, stabilization and treatment  -- Daily contact with patient to assess and evaluate symptoms and progress in treatment  -- Patient's case to be discussed in multi-disciplinary team meeting  -- Observation Level : q15 minute checks  -- Vital signs:  q12 hours  -- Precautions: suicide, elopement, and assault -- Encouraged patient to participate in unit milieu and in scheduled group therapies  2. Psychiatric Diagnoses and Treatment: Patient  2 physicians approved non emergent forced  medications-Dr.Madaram and Dr.Chace Bisch  Zyprexa  15 mg BID oral or Im -non emergent forced medication-patient is taking her oral medication Will add second antipsychotic haldol   to help with the residual psychotic symptoms like Prolixin  once consent is received.-Waiting to hear from DSS caseworker for consent as brother already consented . Discharge Planning:   -- Social work and case management to assist with discharge planning and identification of hospital follow-up needs prior to discharge  -- Estimated LOS: 3-4 days  Allyn Foil, MD 06/13/2024, 12:05 PM

## 2024-06-13 NOTE — Plan of Care (Signed)
   Problem: Activity: Goal: Interest or engagement in activities will improve Outcome: Progressing Goal: Sleeping patterns will improve Outcome: Progressing

## 2024-06-13 NOTE — Progress Notes (Signed)
   06/13/24 1300  Psych Admission Type (Psych Patients Only)  Admission Status Involuntary  Psychosocial Assessment  Patient Complaints None  Eye Contact Brief  Facial Expression Flat  Affect Flat  Speech Slow  Interaction Minimal  Motor Activity Slow  Appearance/Hygiene In scrubs  Behavior Characteristics Guarded  Mood Preoccupied  Thought Process  Coherency Disorganized  Content Preoccupation  Delusions None reported or observed  Perception WDL  Hallucination None reported or observed  Judgment Impaired  Confusion Mild  Danger to Self  Current suicidal ideation? Denies  Agreement Not to Harm Self Yes  Description of Agreement verbal  Danger to Others  Danger to Others None reported or observed

## 2024-06-13 NOTE — Group Note (Signed)
 Recreation Therapy Group Note   Group Topic:Health and Wellness  Group Date: 06/13/2024 Start Time: 1445 End Time: 1530 Facilitators: Celestia Jeoffrey BRAVO, LRT, CTRS Location: Courtyard  Group Description: Outdoor Recreation. Patients had the option to play corn hole, ring toss, UNO, or listening to music while outside in the courtyard getting fresh air and sunlight. Patients helped water  and prune the raised garden beds. LRT and patients discussed things that they enjoy doing in their free time outside of the hospital. LRT encouraged patients to drink water  after being active and getting their heart rate up.   Goal Area(s) Addressed: Patient will identify leisure interests.  Patient will practice healthy decision making. Patient will engage in recreation activity.   Affect/Mood: [name]   Participation Level: Did not attend    Clinical Observations/Individualized Feedback: Patient did not attend group.   Plan: Continue to engage patient in RT group sessions 2-3x/week.   Jeoffrey BRAVO Celestia, LRT, CTRS 06/13/2024 4:26 PM

## 2024-06-14 NOTE — Group Note (Signed)
 Date:  06/14/2024 Time:  10:51 AM  Group Topic/Focus:    Gratitude Gratitude is the act of recognizing and acknowledging the good things that happen, resulting in a state of appreciation [employer], 2010). Often when we consider what we are grateful for, overt and profound life experiences, circumstances, and events come to mind. Gratitude, thankfulness, or gratefulness is a feeling of appreciation by a recipient of another's kindness. This kindness can be gifts, help, favors, or another form of generosity to another person. Gratitude seems to reduce depression symptoms people with a grateful mindset report higher satisfaction with life, strong social relationships and more self-esteem than those who don't practice gratitude. But it's also possible that depressed people are less likely to practice gratitude.  Participation Level:  Did Not Attend   Harlene LITTIE Gavel 06/14/2024, 10:51 AM

## 2024-06-14 NOTE — Progress Notes (Signed)
   06/14/24 1432  Psych Admission Type (Psych Patients Only)  Admission Status Involuntary  Psychosocial Assessment  Patient Complaints None  Eye Contact Avoids  Facial Expression Flat  Affect Flat  Speech Soft  Interaction Isolative  Motor Activity Slow  Appearance/Hygiene In scrubs  Behavior Characteristics Guarded  Mood Preoccupied  Thought Process  Coherency Disorganized  Content Preoccupation  Delusions None reported or observed  Perception WDL  Hallucination None reported or observed  Judgment Impaired  Confusion Mild  Danger to Self  Current suicidal ideation? Denies  Description of Suicide Plan n/a  Self-Injurious Behavior No self-injurious ideation or behavior indicators observed or expressed   Agreement Not to Harm Self Yes  Description of Agreement verbal  Danger to Others  Danger to Others None reported or observed

## 2024-06-14 NOTE — Plan of Care (Signed)
   Problem: Education: Goal: Emotional status will improve Outcome: Not Progressing Goal: Mental status will improve Outcome: Not Progressing   Problem: Activity: Goal: Interest or engagement in activities will improve Outcome: Not Progressing

## 2024-06-14 NOTE — Progress Notes (Signed)
 Phone call back [facility] MD Progress Note  06/14/2024 12:19 PM Tarhonda Hollenberg  MRN:  [id number]  Chee Dimon is a 63 y.o. female admitted: Medicallyfor 05/02/2024 10:56 AM for dehydration, failure to thrive and hypoglycemia. She carries the psychiatric diagnoses of Schizophrenia and has medical hx of hypoglycemia and failure to thrive and has a past medical history of sinus bradycardia, chronic anemia, severe malnutrition and recurrent hypoglycemia.. Her current presentation of verbal aggression, agitation and refusal to eat with severe paranoia is most consistent with paranoid schizophrenia. [name]  She was at [facility] where she was admitted on 04/30/2024.  She was transferred to the ED because of failure to thrive and was admitted to medical floor.Patient is on IVC and patient has been adamantly refusing all oral medications and refusing to eat.  Patient is admitted to [facility] unit with Q15 min safety monitoring. Multidisciplinary team approach is offered. Medication management; group/milieu therapy is offered.    Subjective:  Chart reviewed, case discussed in multidisciplinary meeting, patient seen during rounds.   Patient is noted to be standing in her room.  She remains irritable and paranoid.  When provider asked about any family members calling her or visiting her she states that she has no family and she is not a small kid to answer these questions.  Patient is not endorsing SI/HI/plan but is noted to be responding to internal stimuli.  Patient is taking her medications that are currently prescribed.  She reports attending ADLs.  Patient continues to remain delusional and lack capacity to make any medical decisions at this time.  Awaiting for APS legal guardianship hearing   Past Psychiatric History: see h&P Family History: History reviewed. No pertinent family history. Social History:  Social History   Substance and Sexual Activity  Alcohol Use Not Currently   Comment: Refuses to disclose how much      Social History   Substance and Sexual Activity  Drug Use No   Comment: Refuses to answer    Social History   Socioeconomic History   Marital status: Single    Spouse name: Not on file   Number of children: Not on file   Years of education: Not on file   Highest education level: Not on file  Occupational History   Not on file  Tobacco Use   Smoking status: Every Day    Current packs/day: 2.00    Types: Cigarettes    Passive exposure: Current   Smokeless tobacco: Never  Vaping Use   Vaping status: Never Used  Substance and Sexual Activity   Alcohol use: Not Currently    Comment: Refuses to disclose how much   Drug use: No    Comment: Refuses to answer   Sexual activity: Not Currently    Comment: refused to answer  Other Topics Concern   Not on file  Social History Narrative   Not on file   Social Drivers of Health   Financial Resource Strain: Not on file  Food Insecurity: Patient Declined (05/06/2024)   Hunger Vital Sign    Worried About Running Out of Food in the Last Year: Patient declined    Ran Out of Food in the Last Year: Patient declined  Transportation Needs: Patient Declined (05/06/2024)   PRAPARE - Administrator, Civil Service (Medical): Patient declined    Lack of Transportation (Non-Medical): Patient declined  Physical Activity: Not on file  Stress: Not on file  Social Connections: Not on file   Past Medical  History:  Past Medical History:  Diagnosis Date   Anemia 10/02/2021   Non compliance w medication regimen    Schizophrenia [facility])     Past Surgical History:  Procedure Laterality Date   BIOPSY  09/25/2021   Procedure: BIOPSY;  Surgeon: Teressa Toribio SQUIBB, MD;  Location: [facility];  Service: Gastroenterology;;   ESOPHAGOGASTRODUODENOSCOPY (EGD) WITH PROPOFOL  [name] 09/25/2021   Procedure: ESOPHAGOGASTRODUODENOSCOPY (EGD) WITH PROPOFOL ;  Surgeon: Teressa Toribio SQUIBB, MD;  Location: [facility];  Service: Gastroenterology;  Laterality:  [name];  With NGT placement    Current Medications: Current Facility-Administered Medications  Medication Dose Route Frequency Provider Last Rate Last Admin   acetaminophen  (TYLENOL ) tablet 650 mg  650 mg Oral Q6H PRN Starkes-Perry, Takia S, FNP       alum & mag hydroxide-simeth (MAALOX/MYLANTA) 200-200-20 MG/5ML suspension 30 mL  30 mL Oral Q4H PRN Starkes-Perry, Takia S, FNP       HYDROcodone -acetaminophen  (NORCO/VICODIN) 5-325 MG per tablet 1-2 tablet  1-2 tablet Oral Q4H PRN Starkes-Perry, Takia S, FNP       magnesium  hydroxide (MILK OF MAGNESIA) suspension 30 mL  30 mL Oral Daily PRN Starkes-Perry, Takia S, FNP       multivitamin with minerals tablet 1 tablet  1 tablet Oral Daily Wilkie Majel RAMAN, FNP   1 tablet at 06/14/24 9092   OLANZapine  (ZYPREXA ) injection 5 mg  5 mg Intramuscular TID PRN Wilkie Majel RAMAN, FNP       OLANZapine  (ZYPREXA ) tablet 15 mg  15 mg Oral BID Keiyana Stehr, MD   15 mg at 06/14/24 9092   OLANZapine  zydis (ZYPREXA ) disintegrating tablet 5 mg  5 mg Oral TID PRN Starkes-Perry, Takia S, FNP       ondansetron  (ZOFRAN ) tablet 4 mg  4 mg Oral Q6H PRN Starkes-Perry, Majel RAMAN, FNP       Or   ondansetron  (ZOFRAN ) injection 4 mg  4 mg Intravenous Q6H PRN Starkes-Perry, Majel RAMAN, FNP       thiamine  (VITAMIN B1) tablet 100 mg  100 mg Oral Daily Wilkie Majel RAMAN, FNP   100 mg at 06/14/24 9093   traZODone  (DESYREL ) tablet 50 mg  50 mg Oral QHS Starkes-Perry, Takia S, FNP   50 mg at 06/13/24 2120    Lab Results:  No results found for this or any previous visit (from the past 48 hours).   Blood Alcohol level:  Lab Results  Component Value Date   [facility] <15 05/02/2024   ETH <15 05/01/2024    Metabolic Disorder Labs: Lab Results  Component Value Date   HGBA1C 5.6 05/14/2024   MPG 114.02 05/14/2024   MPG 116.89 05/02/2024   Lab Results  Component Value Date   PROLACTIN 13.6 04/16/2015   Lab Results  Component Value Date   CHOL 219 (H) 05/14/2024    TRIG 84 05/14/2024   HDL 83 05/14/2024   CHOLHDL 2.6 05/14/2024   VLDL 17 05/14/2024   LDLCALC 119 (H) 05/14/2024   LDLCALC 130 (H) 05/01/2024    Physical Findings: AIMS:  , ,  ,  ,    CIWA:    COWS:      Psychiatric Specialty Exam:  Presentation  General Appearance:  Casual  Eye Contact: Fair  Speech: Normal rate  Speech Volume: Increased    Mood and Affect  Mood: Angry  Affect: Congruent   Thought Process  Thought Processes: Disorganized  Descriptions of Associations:Tangential  Orientation:Partial  Thought Content:Illogical; Tangential  Hallucinations:RESPONDING TO INTERNAL STIMULI  Ideas of Reference:Paranoia; Delusions; Percusatory  Suicidal Thoughts:not endorsing Homicidal Thoughts:nor endorsing  Sensorium  Memory: Recent Poor  Judgment: Poor  Insight: Poor   Executive Functions  Concentration: Poor  Attention Span: Poor  Recall: Poor  Fund of Knowledge: Poor  Language: Poor   Psychomotor Activity  Psychomotor Activity:No data recorded Musculoskeletal: Strength & Muscle Tone: within normal limits Gait & Station: normal Assets  Assets: Resilience    Physical Exam: Physical Exam Vitals and nursing note reviewed.    ROS Blood pressure 96/75, pulse (!) 52, temperature (!) 97.3 F (36.3 C), resp. rate 18, height 5' 4 (1.626 m), weight 44.9 kg, SpO2 100%. Body mass index is 16.99 kg/m.  Diagnosis: Active Problems:   Schizophrenia, paranoid (HCC) Paranoid  PLAN: Safety and Monitoring:  -- Involuntary admission to inpatient psychiatric unit for safety, stabilization and treatment  -- Daily contact with patient to assess and evaluate symptoms and progress in treatment  -- Patient's case to be discussed in multi-disciplinary team meeting  -- Observation Level : q15 minute checks  -- Vital signs:  q12 hours  -- Precautions: suicide, elopement, and assault -- Encouraged patient to participate in unit milieu  and in scheduled group therapies  2. Psychiatric Diagnoses and Treatment: Patient  2 physicians approved non emergent forced medications-Dr.Madaram and Dr.Uchechukwu Dhawan  Zyprexa  15 mg BID oral or Im -non emergent forced medication-patient is taking her oral medication Will add second antipsychotic haldol   to help with the residual psychotic symptoms like Prolixin  once consent is received.-Waiting to hear from DSS caseworker for consent as brother already consented . Discharge Planning:   -- Social work and case management to assist with discharge planning and identification of hospital follow-up needs prior to discharge  -- Estimated LOS: 3-4 days  Allyn Foil, MD 06/14/2024, 12:19 PM

## 2024-06-14 NOTE — Progress Notes (Signed)
   06/14/24 0544  15 Minute Checks  Location Bedroom  Visual Appearance Calm  Behavior Composed  Sleep (Behavioral Health Patients Only)  Calculate sleep? (Click Yes once per 24 hr at 0600 safety check) Yes  Documented sleep last 24 hours 13.25

## 2024-06-14 NOTE — Plan of Care (Signed)
  Problem: Education: Goal: Mental status will improve Outcome: Progressing   Problem: Activity: Goal: Sleeping patterns will improve Outcome: Progressing   Problem: Coping: Goal: Ability to demonstrate self-control will improve Outcome: Progressing

## 2024-06-14 NOTE — BHH Counselor (Signed)
 CSW contacted Nat Chang [phone] to confirm ex-parte per meeting with [facility] supervisors on Monday 06/12/24.   CSW unable to reach, left HIPAA compliant VM requesting return call.   [name] Croft, MSW, [place] 06/14/2024 1:14 PM

## 2024-06-14 NOTE — Group Note (Signed)
 Date:  06/14/2024 Time:  8:39 PM  Group Topic/Focus:  Wrap-Up Group:   The focus of this group is to help patients review their daily goal of treatment and discuss progress on daily workbooks.    Participation Level:  Did Not Attend  Participation Quality:     Affect:     Cognitive:     Insight: None  Engagement in Group:  None  Modes of Intervention:     Additional Comments:    Sherrilyn JAYSON Redman 06/14/2024, 8:39 PM

## 2024-06-14 NOTE — BHH Counselor (Signed)
 CSW received return call from Nat Chang [phone])  Nat reports that the ex-parte was finalized and the court date is on Monday 06/19/24.   CSW will inform care team.   [name] Croft, MSW, [facility] 06/14/2024 1:19 PM

## 2024-06-15 NOTE — Group Note (Signed)
 Date:  06/15/2024 Time:  11:21 AM  Group Topic/Focus:  Wellness Toolbox:   The focus of this group is to discuss various aspects of wellness, balancing those aspects and exploring ways to increase the ability to experience wellness.  Patients will create a wellness toolbox for use upon discharge.    Participation Level:  Did Not Attend  06/15/2024, 11:21 AM

## 2024-06-15 NOTE — Progress Notes (Signed)
 Phone call back [facility] MD Progress Note  06/15/2024 11:53 AM [name]  MRN:  [id number]  [name] is a 63 y.o. female admitted: [facility] 05/02/2024 10:56 AM for dehydration, failure to thrive and hypoglycemia. She carries the psychiatric diagnoses of Schizophrenia and has medical hx of hypoglycemia and failure to thrive and has a past medical history of sinus bradycardia, chronic anemia, severe malnutrition and recurrent hypoglycemia.. Her current presentation of verbal aggression, agitation and refusal to eat with severe paranoia is most consistent with paranoid schizophrenia. [name]  She was at [facility] where she was admitted on 04/30/2024.  She was transferred to the ED because of failure to thrive and was admitted to medical floor.Patient is on IVC and patient has been adamantly refusing all oral medications and refusing to eat.  Patient is admitted to [facility] unit with Q15 min safety monitoring. Multidisciplinary team approach is offered. Medication management; group/milieu therapy is offered.    Subjective:  Chart reviewed, case discussed in multidisciplinary meeting, patient seen during rounds.    Patient is noted to be standing in her room and talking to herself.  She is visibly having conversations to herself.  She remains hostile and irritable and refuses to acknowledge her mental health problems and the need for treatment.  She remains discharge focused and refuses to discuss her family members or her house that she needs to go back to.  She refuses to work with the social workers on her disposition.  She denies SI/HI/plan and denies hallucinations.  She informs the provider that she does not need to see her daily basis and asked the same questions.  Per social work team APS planning for the guardianship Past Psychiatric History: see h&P Family History: History reviewed. No pertinent family history. Social History:  Social History   Substance and Sexual Activity  Alcohol Use Not  Currently   Comment: Refuses to disclose how much     Social History   Substance and Sexual Activity  Drug Use No   Comment: Refuses to answer    Social History   Socioeconomic History   Marital status: Single    Spouse name: Not on file   Number of children: Not on file   Years of education: Not on file   Highest education level: Not on file  Occupational History   Not on file  Tobacco Use   Smoking status: Every Day    Current packs/day: 2.00    Types: Cigarettes    Passive exposure: Current   Smokeless tobacco: Never  Vaping Use   Vaping status: Never Used  Substance and Sexual Activity   Alcohol use: Not Currently    Comment: Refuses to disclose how much   Drug use: No    Comment: Refuses to answer   Sexual activity: Not Currently    Comment: refused to answer  Other Topics Concern   Not on file  Social History Narrative   Not on file   Social Drivers of Health   Financial Resource Strain: Not on file  Food Insecurity: Patient Declined (05/06/2024)   Hunger Vital Sign    Worried About Running Out of Food in the Last Year: Patient declined    Ran Out of Food in the Last Year: Patient declined  Transportation Needs: Patient Declined (05/06/2024)   PRAPARE - Administrator, Civil Service (Medical): Patient declined    Lack of Transportation (Non-Medical): Patient declined  Physical Activity: Not on file  Stress: Not on file  Social Connections: Not on file   Past Medical History:  Past Medical History:  Diagnosis Date   Anemia 10/02/2021   Non compliance w medication regimen    Schizophrenia [facility])     Past Surgical History:  Procedure Laterality Date   BIOPSY  09/25/2021   Procedure: BIOPSY;  Surgeon: Teressa Toribio SQUIBB, MD;  Location: [facility];  Service: Gastroenterology;;   ESOPHAGOGASTRODUODENOSCOPY (EGD) WITH PROPOFOL  [name] 09/25/2021   Procedure: ESOPHAGOGASTRODUODENOSCOPY (EGD) WITH PROPOFOL ;  Surgeon: Teressa Toribio SQUIBB, MD;  Location: [facility];  Service: Gastroenterology;  Laterality: [name];  With NGT placement    Current Medications: Current Facility-Administered Medications  Medication Dose Route Frequency Provider Last Rate Last Admin   acetaminophen  (TYLENOL ) tablet 650 mg  650 mg Oral Q6H PRN Starkes-Perry, Takia S, FNP       alum & mag hydroxide-simeth (MAALOX/MYLANTA) 200-200-20 MG/5ML suspension 30 mL  30 mL Oral Q4H PRN Starkes-Perry, Takia S, FNP       HYDROcodone -acetaminophen  (NORCO/VICODIN) 5-325 MG per tablet 1-2 tablet  1-2 tablet Oral Q4H PRN Starkes-Perry, Takia S, FNP       magnesium  hydroxide (MILK OF MAGNESIA) suspension 30 mL  30 mL Oral Daily PRN Starkes-Perry, Takia S, FNP       multivitamin with minerals tablet 1 tablet  1 tablet Oral Daily Starkes-Perry, Takia S, FNP   1 tablet at 06/15/24 0912   OLANZapine  (ZYPREXA ) injection 5 mg  5 mg Intramuscular TID PRN Starkes-Perry, Takia S, FNP       OLANZapine  (ZYPREXA ) tablet 15 mg  15 mg Oral BID Gerson Fauth, MD   15 mg at 06/15/24 9089   OLANZapine  zydis (ZYPREXA ) disintegrating tablet 5 mg  5 mg Oral TID PRN Starkes-Perry, Takia S, FNP       ondansetron  (ZOFRAN ) tablet 4 mg  4 mg Oral Q6H PRN Starkes-Perry, Majel RAMAN, FNP       Or   ondansetron  (ZOFRAN ) injection 4 mg  4 mg Intravenous Q6H PRN Starkes-Perry, Majel RAMAN, FNP       thiamine  (VITAMIN B1) tablet 100 mg  100 mg Oral Daily Wilkie Majel RAMAN, FNP   100 mg at 06/15/24 0912   traZODone  (DESYREL ) tablet 50 mg  50 mg Oral QHS Starkes-Perry, Takia S, FNP   50 mg at 06/14/24 2115    Lab Results:  No results found for this or any previous visit (from the past 48 hours).   Blood Alcohol level:  Lab Results  Component Value Date   [facility] <15 05/02/2024   ETH <15 05/01/2024    Metabolic Disorder Labs: Lab Results  Component Value Date   HGBA1C 5.6 05/14/2024   MPG 114.02 05/14/2024   MPG 116.89 05/02/2024   Lab Results  Component Value Date   PROLACTIN 13.6 04/16/2015   Lab Results   Component Value Date   CHOL 219 (H) 05/14/2024   TRIG 84 05/14/2024   HDL 83 05/14/2024   CHOLHDL 2.6 05/14/2024   VLDL 17 05/14/2024   LDLCALC 119 (H) 05/14/2024   LDLCALC 130 (H) 05/01/2024    Physical Findings: AIMS:  , ,  ,  ,    CIWA:    COWS:      Psychiatric Specialty Exam:  Presentation  General Appearance:  Casual  Eye Contact: Fair  Speech: Normal rate  Speech Volume: Increased    Mood and Affect  Mood: Angry  Affect: Congruent   Thought Process  Thought Processes: Disorganized  Descriptions of Associations:Tangential  Orientation:Partial  Thought Content:Illogical; Tangential  Hallucinations:RESPONDING TO INTERNAL STIMULI Ideas of Reference:Paranoia; Delusions; Percusatory  Suicidal Thoughts:not endorsing Homicidal Thoughts:nor endorsing  Sensorium  Memory: Recent Poor  Judgment: Poor  Insight: Poor   Executive Functions  Concentration: Poor  Attention Span: Poor  Recall: Poor  Fund of Knowledge: Poor  Language: Poor   Psychomotor Activity  Psychomotor Activity:No data recorded Musculoskeletal: Strength & Muscle Tone: within normal limits Gait & Station: normal Assets  Assets: Resilience    Physical Exam: Physical Exam Vitals and nursing note reviewed.    ROS Blood pressure 110/69, pulse 69, temperature 98.7 F (37.1 C), resp. rate 10, height 5' 4 (1.626 m), weight 44.9 kg, SpO2 100%. Body mass index is 16.99 kg/m.  Diagnosis: Active Problems:   Schizophrenia, paranoid (HCC) Paranoid  PLAN: Safety and Monitoring:  -- Involuntary admission to inpatient psychiatric unit for safety, stabilization and treatment  -- Daily contact with patient to assess and evaluate symptoms and progress in treatment  -- Patient's case to be discussed in multi-disciplinary team meeting  -- Observation Level : q15 minute checks  -- Vital signs:  q12 hours  -- Precautions: suicide, elopement, and assault --  Encouraged patient to participate in unit milieu and in scheduled group therapies  2. Psychiatric Diagnoses and Treatment: Patient  2 physicians approved non emergent forced medications-Dr.Madaram and Dr.Lynze Reddy  Zyprexa  15 mg BID oral or Im -non emergent forced medication-patient is taking her oral medication Will add second antipsychotic haldol   to help with the residual psychotic symptoms like Prolixin  once consent is received.-Waiting to hear from DSS caseworker for consent as brother already consented . Discharge Planning:   -- Social work and case management to assist with discharge planning and identification of hospital follow-up needs prior to discharge  -- Estimated LOS: 3-4 days  Allyn Foil, MD 06/15/2024, 11:53 AM

## 2024-06-15 NOTE — Progress Notes (Signed)
   06/15/24 2344  Psych Admission Type (Psych Patients Only)  Admission Status Involuntary  Psychosocial Assessment  Patient Complaints None  Eye Contact Avoids  Facial Expression Flat  Affect Flat  Speech Soft  Interaction Minimal;Guarded  Motor Activity Pacing  Appearance/Hygiene In scrubs  Behavior Characteristics Cooperative;Guarded;Pacing  Mood Pleasant;Preoccupied  Aggressive Behavior  Effect No apparent injury  Thought Process  Coherency Disorganized  Content Preoccupation  Delusions None reported or observed  Perception WDL  Hallucination None reported or observed  Judgment Impaired  Confusion Mild  Danger to Self  Current suicidal ideation? Denies  Self-Injurious Behavior No self-injurious ideation or behavior indicators observed or expressed   Agreement Not to Harm Self Yes  Description of Agreement Verbal  Danger to Others  Danger to Others None reported or observed

## 2024-06-15 NOTE — Group Note (Signed)
 LCSW Group Therapy Note   Group Date: 06/15/2024 Start Time: 1300 End Time: 1330   Type of Therapy and Topic:  Group Therapy: Challenging Core Beliefs  Participation Level:  Did Not Attend  Description of Group:  Patients were educated about core beliefs and asked to identify one harmful core belief that they have. Patients were asked to explore from where those beliefs originate. Patients were asked to discuss how those beliefs make them feel and the resulting behaviors of those beliefs. They were then be asked if those beliefs are true and, if so, what evidence they have to support them. Lastly, group members were challenged to replace those negative core beliefs with helpful beliefs.   Therapeutic Goals:   1. Patient will identify harmful core beliefs and explore the origins of such beliefs. 2. Patient will identify feelings and behaviors that result from those core beliefs. 3. Patient will discuss whether such beliefs are true. 4.  Patient will replace harmful core beliefs with helpful ones.  Summary of Patient Progress: X  Therapeutic Modalities: Cognitive Behavioral Therapy; Solution-Focused Therapy   Deryl Ports D Analese Sovine, [place] 06/15/2024  1:30 PM

## 2024-06-15 NOTE — Group Note (Signed)
 Date:  06/15/2024 Time:  3:13 PM  Group Topic/Focus:  Thanksgiving Football and conversation     Participation Level:  Did Not Attend     [name] 06/15/2024, 3:13 PM

## 2024-06-15 NOTE — Group Note (Signed)
 Recreation Therapy Group Note   Group Topic:Communication  Group Date: 06/15/2024 Start Time: 1210 End Time: 1240 Facilitators: Celestia Jeoffrey FORBES ARTICE, CTRS Location: Craft Room  Group Description: Gratitude Conversations. Patients and LRT discussed what gratitude means, how we can express it and what it means to us , personally. LRT gave an educational handout on the definition of gratitude as well as the benefits that come from it. LRT and patients discussed different gratitude prompts and allowed patients to join in the guided discussion. LRT and pts processed how showing gratitude towards themselves, and others can be applied to everyday life post-discharge. LRT offered journals and folders to pts afterwards.   Goal Area(s) Addressed:  Patient will identify the definition of gratitude. Patient will learn different gratitude exercises. Patient will increase communication.  Patient will identify a new coping skill.   Affect/Mood: [name]   Participation Level: Did not attend    Clinical Observations/Individualized Feedback: Patient did not attend group.   Plan: Continue to engage patient in RT group sessions 2-3x/week.   Jeoffrey FORBES Celestia, LRT, CTRS 06/15/2024 1:00 PM

## 2024-06-15 NOTE — Plan of Care (Signed)
  Problem: Education: Goal: Knowledge of [place] [facility] Education information/materials will improve Outcome: Progressing Goal: Emotional status will improve Outcome: Progressing Goal: Mental status will improve Outcome: Progressing Goal: Verbalization of understanding the information provided will improve Outcome: Progressing   Problem: Activity: Goal: Interest or engagement in activities will improve Outcome: Progressing Goal: Sleeping patterns will improve Outcome: Progressing   Problem: Coping: Goal: Ability to verbalize frustrations and anger appropriately will improve Outcome: Progressing Goal: Ability to demonstrate self-control will improve Outcome: Progressing   Problem: Health Behavior/Discharge Planning: Goal: Identification of resources available to assist in meeting health care needs will improve Outcome: Progressing Goal: Compliance with treatment plan for underlying cause of condition will improve Outcome: Progressing   Problem: Physical Regulation: Goal: Ability to maintain clinical measurements within normal limits will improve Outcome: Progressing   Problem: Safety: Goal: Periods of time without injury will increase Outcome: Progressing   Problem: Education: Goal: Knowledge of General Education information will improve Description: Including pain rating scale, medication(s)/side effects and non-pharmacologic comfort measures Outcome: Progressing   Problem: Health Behavior/Discharge Planning: Goal: Ability to manage health-related needs will improve Outcome: Progressing   Problem: Clinical Measurements: Goal: Ability to maintain clinical measurements within normal limits will improve Outcome: Progressing Goal: Will remain free from infection Outcome: Progressing Goal: Diagnostic test results will improve Outcome: Progressing Goal: Respiratory complications will improve Outcome: Progressing Goal: Cardiovascular complication will be  avoided Outcome: Progressing   Problem: Activity: Goal: Risk for activity intolerance will decrease Outcome: Progressing   Problem: Nutrition: Goal: Adequate nutrition will be maintained Outcome: Progressing   Problem: Coping: Goal: Level of anxiety will decrease Outcome: Progressing   Problem: Elimination: Goal: Will not experience complications related to bowel motility Outcome: Progressing Goal: Will not experience complications related to urinary retention Outcome: Progressing   Problem: Pain Managment: Goal: General experience of comfort will improve and/or be controlled Outcome: Progressing   Problem: Safety: Goal: Ability to remain free from injury will improve Outcome: Progressing   Problem: Skin Integrity: Goal: Risk for impaired skin integrity will decrease Outcome: Progressing   Problem: Health Behavior/Discharge Planning: Goal: Compliance with prescribed medication regimen will improve Outcome: Progressing   Problem: Nutritional: Goal: Ability to achieve adequate nutritional intake will improve Outcome: Progressing

## 2024-06-15 NOTE — Progress Notes (Signed)
   06/14/24 1325  Spiritual Encounters  Type of Visit Declined chaplain visit   I invited [name] to join spirituality group. She politely declined my visit and chose not to attend group. Will continue to offer support.  Lizet Kelso L. Delores HERO.Div

## 2024-06-15 NOTE — Progress Notes (Signed)
 Behavior:  Cooperative with medications and VS.   Psych assessment:  Denies SI.  UTA further. Guarded.  Interaction / Group attendance:  Isolates to room with the exception of meals.    Medication/ PRNs: Compliant.   Pain: Denies.  15 min checks in place for safety.

## 2024-06-15 NOTE — Plan of Care (Signed)
  Problem: Coping: Goal: Ability to demonstrate self-control will improve Outcome: Progressing   Problem: Health Behavior/Discharge Planning: Goal: Compliance with treatment plan for underlying cause of condition will improve Outcome: Progressing   Problem: Safety: Goal: Periods of time without injury will increase Outcome: Progressing   Problem: Activity: Goal: Interest or engagement in activities will improve Outcome: Not Progressing

## 2024-06-16 NOTE — Group Note (Signed)
 Recreation Therapy Group Note   Group Topic:Leisure Education  Group Date: 06/16/2024 Start Time: 1215 End Time: 1300 Facilitators: Celestia Jeoffrey BRAVO, LRT, CTRS Location: Dayroom  Group Description: Bingo. Patients played multiple rounds of bingo. LRT and pts discussed the definition of leisure, things they do in their free time outside of the hospital, and how bingo is also a leisure activity. Pts received a journal or coloring book as their bingo prize.   Goal Area(s) Addressed:  Patient will identify a current leisure interest.  Patient will learn the definition of "leisure". Patient will have the opportunity to try a new leisure activity. Patient will communicate with peers and LRT.   Affect/Mood: [name]   Participation Level: Did not attend    Clinical Observations/Individualized Feedback: Patient did not attend group.   Plan: Continue to engage patient in RT group sessions 2-3x/week.   Jeoffrey BRAVO Celestia, LRT, CTRS 06/16/2024 1:53 PM

## 2024-06-16 NOTE — Group Note (Signed)
 Date:  06/16/2024 Time:  10:27 AM  Group Topic/Focus:  Dimensions of Wellness:   The focus of this group is to introduce the topic of wellness and discuss the role each dimension of wellness plays in total health. Self Care:   The focus of this group is to help patients understand the importance of self-care in order to improve or restore emotional, physical, spiritual, interpersonal, and financial health.    Participation Level:  Did Not Attend  Participation Quality:  Pt did not attend afternoon group.  Affect:  Appropriate  Cognitive:  Appropriate  Insight: Appropriate  Engagement in Group:  pt did not attend afternoon group.  Modes of Intervention:  pt did not attend afternoon group.  Additional Comments:  Pt did not attend afternoon group. Will continue to motivate to attend later group sessions with peers.  Brad GORMAN Ryder 06/16/2024, 10:27 AM

## 2024-06-16 NOTE — Progress Notes (Signed)
 Behavior:  Cooperative with meds and VS.   Psych assessment: Denies SI.  UTA further.  Interaction / Group attendance:  Isolates to room with the exception of meals.  No interaction with peers.  Minimal, guarded interaction with staff.    Medication/ PRNs: Compliant.   Pain: Denies.  15 min checks in place for safety.

## 2024-06-16 NOTE — Group Note (Deleted)
 Recreation Therapy Group Note   Group Topic:Leisure Education  Group Date: 06/16/2024 Start Time: 1120 End Time: 1200 Facilitators: Celestia Jeoffrey BRAVO, LRT Location: Craft Room  Group Description: Leisure. Patients were given the option to choose from journaling, coloring, drawing, making origami, playing with playdoh, listening to music or singing karaoke. LRT and pts discussed the meaning of leisure, the importance of participating in leisure during their free time/when they're outside of the hospital, as well as how our leisure interests can also serve as coping skills.   Goal Area(s) Addressed:  Patient will identify a current leisure interest.  Patient will learn the definition of "leisure". Patient will practice making a positive decision. Patient will have the opportunity to try a new leisure activity. Patient will communicate with peers and LRT.      Affect/Mood: {RT BHH Affect/Mood:26271}   Participation Level: {RT BHH Participation Level:26267}   Participation Quality: {RT BHH Participation Quality:26268}   Behavior: {RT BHH Group Behavior:26269}   Speech/Thought Process: {RT BHH Speech/Thought:26276}   Insight: {RT BHH Insight:26272}   Judgement: {RT BHH Judgement:26278}   Modes of Intervention: {RT BHH Modes of Intervention:26277}   Patient Response to Interventions:  {RT BHH Patient Response to Intervention:26274}   Education Outcome:  {RT BHH Education Outcome:26279}   Clinical Observations/Individualized Feedback: *** was *** in their participation of session activities and group discussion. Pt identified ***   Plan: {RT BHH Tx Eojw:73719}   Jeoffrey BRAVO Celestia, LRT,  06/16/2024 1:30 PM

## 2024-06-16 NOTE — Progress Notes (Signed)
 Phone call back [facility] MD Progress Note  06/16/2024 12:43 PM [name]  MRN:  [id number]  [name] is a 63 y.o. female admitted: Medicallyfor 05/02/2024 10:56 AM for dehydration, failure to thrive and hypoglycemia. She carries the psychiatric diagnoses of Schizophrenia and has medical hx of hypoglycemia and failure to thrive and has a past medical history of sinus bradycardia, chronic anemia, severe malnutrition and recurrent hypoglycemia.. Her current presentation of verbal aggression, agitation and refusal to eat with severe paranoia is most consistent with paranoid schizophrenia. [name]  She was at [facility] where she was admitted on 04/30/2024.  She was transferred to the ED because of failure to thrive and was admitted to medical floor.Patient is on IVC and patient has been adamantly refusing all oral medications and refusing to eat.  Patient is admitted to [facility] unit with Q15 min safety monitoring. Multidisciplinary team approach is offered. Medication management; group/milieu therapy is offered.    Subjective:  Chart reviewed, case discussed in multidisciplinary meeting, patient seen during rounds.   Patient is noted to be standing in the hall.  She remains irritable when provider went into talk to the patient.  She reports fair appetite and sleep and refuses to give any details about her daily activity on the unit and participation in groups she becomes irritable and remains paranoid making a statement of being ready to leave and the provider has no business to know anything about her.  She is not endorsing SI/HI/plan.  She is clearly noted to be responding to  Past Psychiatric History: see h&P Family History: History reviewed. No pertinent family history. Social History:  Social History   Substance and Sexual Activity  Alcohol Use Not Currently   Comment: Refuses to disclose how much     Social History   Substance and Sexual Activity  Drug Use No   Comment: Refuses to answer    Social  History   Socioeconomic History   Marital status: Single    Spouse name: Not on file   Number of children: Not on file   Years of education: Not on file   Highest education level: Not on file  Occupational History   Not on file  Tobacco Use   Smoking status: Every Day    Current packs/day: 2.00    Types: Cigarettes    Passive exposure: Current   Smokeless tobacco: Never  Vaping Use   Vaping status: Never Used  Substance and Sexual Activity   Alcohol use: Not Currently    Comment: Refuses to disclose how much   Drug use: No    Comment: Refuses to answer   Sexual activity: Not Currently    Comment: refused to answer  Other Topics Concern   Not on file  Social History Narrative   Not on file   Social Drivers of Health   Financial Resource Strain: Not on file  Food Insecurity: Patient Declined (05/06/2024)   Hunger Vital Sign    Worried About Running Out of Food in the Last Year: Patient declined    Ran Out of Food in the Last Year: Patient declined  Transportation Needs: Patient Declined (05/06/2024)   PRAPARE - Administrator, Civil Service (Medical): Patient declined    Lack of Transportation (Non-Medical): Patient declined  Physical Activity: Not on file  Stress: Not on file  Social Connections: Not on file   Past Medical History:  Past Medical History:  Diagnosis Date   Anemia 10/02/2021   Non compliance  w medication regimen    Schizophrenia [facility])     Past Surgical History:  Procedure Laterality Date   BIOPSY  09/25/2021   Procedure: BIOPSY;  Surgeon: Teressa Toribio SQUIBB, MD;  Location: [facility];  Service: Gastroenterology;;   ESOPHAGOGASTRODUODENOSCOPY (EGD) WITH PROPOFOL  [name] 09/25/2021   Procedure: ESOPHAGOGASTRODUODENOSCOPY (EGD) WITH PROPOFOL ;  Surgeon: Teressa Toribio SQUIBB, MD;  Location: [facility];  Service: Gastroenterology;  Laterality: [name];  With NGT placement    Current Medications: Current Facility-Administered Medications  Medication  Dose Route Frequency Provider Last Rate Last Admin   acetaminophen  (TYLENOL ) tablet 650 mg  650 mg Oral Q6H PRN Starkes-Perry, Takia S, FNP       alum & mag hydroxide-simeth (MAALOX/MYLANTA) 200-200-20 MG/5ML suspension 30 mL  30 mL Oral Q4H PRN Starkes-Perry, Takia S, FNP       HYDROcodone -acetaminophen  (NORCO/VICODIN) 5-325 MG per tablet 1-2 tablet  1-2 tablet Oral Q4H PRN Starkes-Perry, Takia S, FNP       magnesium  hydroxide (MILK OF MAGNESIA) suspension 30 mL  30 mL Oral Daily PRN Starkes-Perry, Takia S, FNP       multivitamin with minerals tablet 1 tablet  1 tablet Oral Daily Wilkie Majel RAMAN, FNP   1 tablet at 06/16/24 9063   OLANZapine  (ZYPREXA ) injection 5 mg  5 mg Intramuscular TID PRN Wilkie Majel RAMAN, FNP       OLANZapine  (ZYPREXA ) tablet 15 mg  15 mg Oral BID Yusuke Beza, MD   15 mg at 06/16/24 0935   OLANZapine  zydis (ZYPREXA ) disintegrating tablet 5 mg  5 mg Oral TID PRN Starkes-Perry, Takia S, FNP       ondansetron  (ZOFRAN ) tablet 4 mg  4 mg Oral Q6H PRN Starkes-Perry, Majel RAMAN, FNP       Or   ondansetron  (ZOFRAN ) injection 4 mg  4 mg Intravenous Q6H PRN Starkes-Perry, Majel RAMAN, FNP       thiamine  (VITAMIN B1) tablet 100 mg  100 mg Oral Daily Wilkie Majel RAMAN, FNP   100 mg at 06/16/24 0936   traZODone  (DESYREL ) tablet 50 mg  50 mg Oral QHS Starkes-Perry, Takia S, FNP   50 mg at 06/15/24 2120    Lab Results:  No results found for this or any previous visit (from the past 48 hours).   Blood Alcohol level:  Lab Results  Component Value Date   [facility] <15 05/02/2024   ETH <15 05/01/2024    Metabolic Disorder Labs: Lab Results  Component Value Date   HGBA1C 5.6 05/14/2024   MPG 114.02 05/14/2024   MPG 116.89 05/02/2024   Lab Results  Component Value Date   PROLACTIN 13.6 04/16/2015   Lab Results  Component Value Date   CHOL 219 (H) 05/14/2024   TRIG 84 05/14/2024   HDL 83 05/14/2024   CHOLHDL 2.6 05/14/2024   VLDL 17 05/14/2024   LDLCALC 119 (H)  05/14/2024   LDLCALC 130 (H) 05/01/2024    Physical Findings: AIMS:  , ,  ,  ,    CIWA:    COWS:      Psychiatric Specialty Exam:  Presentation  General Appearance:  Casual  Eye Contact: Fair  Speech: Normal rate  Speech Volume: Increased    Mood and Affect  Mood: Angry  Affect: Congruent   Thought Process  Thought Processes: Disorganized  Descriptions of Associations:Tangential  Orientation:Partial  Thought Content:Illogical; Tangential  Hallucinations:RESPONDING TO INTERNAL STIMULI Ideas of Reference:Paranoia; Delusions; Percusatory  Suicidal Thoughts:not endorsing Homicidal Thoughts:nor endorsing  Sensorium  Memory:  Recent Poor  Judgment: Poor  Insight: Poor   Executive Functions  Concentration: Poor  Attention Span: Poor  Recall: Poor  Fund of Knowledge: Poor  Language: Poor   Psychomotor Activity  Psychomotor Activity:No data recorded Musculoskeletal: Strength & Muscle Tone: within normal limits Gait & Station: normal Assets  Assets: Resilience    Physical Exam: Physical Exam Vitals and nursing note reviewed.    ROS Blood pressure (!) 109/55, pulse (!) 51, temperature 97.9 F (36.6 C), resp. rate 18, height 5' 4 (1.626 m), weight 44.9 kg, SpO2 100%. Body mass index is 16.99 kg/m.  Diagnosis: Active Problems:   Schizophrenia, paranoid (HCC) Paranoid  PLAN: Safety and Monitoring:  -- Involuntary admission to inpatient psychiatric unit for safety, stabilization and treatment  -- Daily contact with patient to assess and evaluate symptoms and progress in treatment  -- Patient's case to be discussed in multi-disciplinary team meeting  -- Observation Level : q15 minute checks  -- Vital signs:  q12 hours  -- Precautions: suicide, elopement, and assault -- Encouraged patient to participate in unit milieu and in scheduled group therapies  2. Psychiatric Diagnoses and Treatment: Patient  2 physicians approved  non emergent forced medications-Dr.Madaram and Dr.Ericha Whittingham  Zyprexa  15 mg BID oral or Im -non emergent forced medication-patient is taking her oral medication Will add second antipsychotic haldol   to help with the residual psychotic symptoms like Prolixin  once consent is received.-Waiting to hear from DSS caseworker for consent as brother already consented . Discharge Planning:   -- Social work and case management to assist with discharge planning and identification of hospital follow-up needs prior to discharge  -- Estimated LOS: 3-4 days  Allyn Foil, MD 06/16/2024, 12:43 PM

## 2024-06-16 NOTE — Plan of Care (Signed)
  Problem: Coping: Goal: Ability to demonstrate self-control will improve Outcome: Progressing   Problem: Activity: Goal: Interest or engagement in activities will improve Outcome: Not Progressing

## 2024-06-16 NOTE — Group Note (Signed)
 Date:  06/16/2024 Time:  9:33 PM  Group Topic/Focus:  Coping With Mental Health Crisis:   The purpose of this group is to help patients identify strategies for coping with mental health crisis.  Group discusses possible causes of crisis and ways to manage them effectively.    Participation Level:  Did Not Attend  Leigh VEAR Pais 06/16/2024, 9:33 PM

## 2024-06-17 NOTE — Plan of Care (Signed)
  Problem: Education: Goal: Knowledge of [place] [facility] Education information/materials will improve Outcome: Progressing Goal: Emotional status will improve Outcome: Progressing Goal: Mental status will improve Outcome: Progressing Goal: Verbalization of understanding the information provided will improve Outcome: Progressing   Problem: Activity: Goal: Interest or engagement in activities will improve Outcome: Progressing Goal: Sleeping patterns will improve Outcome: Progressing   Problem: Coping: Goal: Ability to verbalize frustrations and anger appropriately will improve Outcome: Progressing Goal: Ability to demonstrate self-control will improve Outcome: Progressing   Problem: Health Behavior/Discharge Planning: Goal: Identification of resources available to assist in meeting health care needs will improve Outcome: Progressing Goal: Compliance with treatment plan for underlying cause of condition will improve Outcome: Progressing   Problem: Physical Regulation: Goal: Ability to maintain clinical measurements within normal limits will improve Outcome: Progressing   Problem: Safety: Goal: Periods of time without injury will increase Outcome: Progressing   Problem: Education: Goal: Knowledge of General Education information will improve Description: Including pain rating scale, medication(s)/side effects and non-pharmacologic comfort measures Outcome: Progressing   Problem: Health Behavior/Discharge Planning: Goal: Ability to manage health-related needs will improve Outcome: Progressing   Problem: Clinical Measurements: Goal: Ability to maintain clinical measurements within normal limits will improve Outcome: Progressing Goal: Will remain free from infection Outcome: Progressing Goal: Diagnostic test results will improve Outcome: Progressing Goal: Respiratory complications will improve Outcome: Progressing Goal: Cardiovascular complication will be  avoided Outcome: Progressing   Problem: Activity: Goal: Risk for activity intolerance will decrease Outcome: Progressing   Problem: Nutrition: Goal: Adequate nutrition will be maintained Outcome: Progressing   Problem: Coping: Goal: Level of anxiety will decrease Outcome: Progressing   Problem: Elimination: Goal: Will not experience complications related to bowel motility Outcome: Progressing Goal: Will not experience complications related to urinary retention Outcome: Progressing   Problem: Pain Managment: Goal: General experience of comfort will improve and/or be controlled Outcome: Progressing   Problem: Safety: Goal: Ability to remain free from injury will improve Outcome: Progressing   Problem: Skin Integrity: Goal: Risk for impaired skin integrity will decrease Outcome: Progressing   Problem: Health Behavior/Discharge Planning: Goal: Compliance with prescribed medication regimen will improve Outcome: Progressing   Problem: Nutritional: Goal: Ability to achieve adequate nutritional intake will improve Outcome: Progressing

## 2024-06-17 NOTE — Progress Notes (Signed)
   06/17/24 0709  Psych Admission Type (Psych Patients Only)  Admission Status Involuntary  Psychosocial Assessment  Patient Complaints None  Eye Contact None  Facial Expression Flat  Affect Flat  Speech Soft  Interaction Guarded  Motor Activity Slow  Appearance/Hygiene In scrubs  Behavior Characteristics Cooperative  Mood Empty  Thought Process  Coherency WDL  Content WDL  Delusions None reported or observed  Perception WDL  Hallucination None reported or observed  Judgment Impaired  Confusion Mild  Danger to Self  Current suicidal ideation? Denies  Danger to Others  Danger to Others None reported or observed

## 2024-06-17 NOTE — Progress Notes (Signed)
 Phone call back [facility] MD Progress Note  06/17/2024 2:06 PM [name]  MRN:  [id number]  [name] is a 63 y.o. female admitted: Medicallyfor 05/02/2024 10:56 AM for dehydration, failure to thrive and hypoglycemia. She carries the psychiatric diagnoses of Schizophrenia and has medical hx of hypoglycemia and failure to thrive and has a past medical history of sinus bradycardia, chronic anemia, severe malnutrition and recurrent hypoglycemia.. Her current presentation of verbal aggression, agitation and refusal to eat with severe paranoia is most consistent with paranoid schizophrenia. [name]  She was at [facility] where she was admitted on 04/30/2024.  She was transferred to the ED because of failure to thrive and was admitted to medical floor.Patient is on IVC and patient has been adamantly refusing all oral medications and refusing to eat.  Patient is admitted to [facility] unit with Q15 min safety monitoring. Multidisciplinary team approach is offered. Medication management; group/milieu therapy is offered.    Subjective:  Chart reviewed, case discussed in multidisciplinary meeting, patient seen during rounds.    Patient is noted to be standing in her room.  She remains irritable.  She has fair appetite and sleep.  She refuses to answer further questions and demanding to leave the unit.  Per nursing she is taking her medications Past Psychiatric History: see h&P Family History: History reviewed. No pertinent family history. Social History:  Social History   Substance and Sexual Activity  Alcohol Use Not Currently   Comment: Refuses to disclose how much     Social History   Substance and Sexual Activity  Drug Use No   Comment: Refuses to answer    Social History   Socioeconomic History   Marital status: Single    Spouse name: Not on file   Number of children: Not on file   Years of education: Not on file   Highest education level: Not on file  Occupational History   Not on file  Tobacco Use    Smoking status: Every Day    Current packs/day: 2.00    Types: Cigarettes    Passive exposure: Current   Smokeless tobacco: Never  Vaping Use   Vaping status: Never Used  Substance and Sexual Activity   Alcohol use: Not Currently    Comment: Refuses to disclose how much   Drug use: No    Comment: Refuses to answer   Sexual activity: Not Currently    Comment: refused to answer  Other Topics Concern   Not on file  Social History Narrative   Not on file   Social Drivers of Health   Financial Resource Strain: Not on file  Food Insecurity: Patient Declined (05/06/2024)   Hunger Vital Sign    Worried About Running Out of Food in the Last Year: Patient declined    Ran Out of Food in the Last Year: Patient declined  Transportation Needs: Patient Declined (05/06/2024)   PRAPARE - Administrator, Civil Service (Medical): Patient declined    Lack of Transportation (Non-Medical): Patient declined  Physical Activity: Not on file  Stress: Not on file  Social Connections: Not on file   Past Medical History:  Past Medical History:  Diagnosis Date   Anemia 10/02/2021   Non compliance w medication regimen    Schizophrenia (HCC)     Past Surgical History:  Procedure Laterality Date   BIOPSY  09/25/2021   Procedure: BIOPSY;  Surgeon: Teressa Toribio SQUIBB, MD;  Location: [facility];  Service: Gastroenterology;;   ESOPHAGOGASTRODUODENOSCOPY (  EGD) WITH PROPOFOL  [name] 09/25/2021   Procedure: ESOPHAGOGASTRODUODENOSCOPY (EGD) WITH PROPOFOL ;  Surgeon: Teressa Toribio SQUIBB, MD;  Location: [facility];  Service: Gastroenterology;  Laterality: [name];  With NGT placement    Current Medications: Current Facility-Administered Medications  Medication Dose Route Frequency Provider Last Rate Last Admin   acetaminophen  (TYLENOL ) tablet 650 mg  650 mg Oral Q6H PRN Starkes-Perry, Takia S, FNP       alum & mag hydroxide-simeth (MAALOX/MYLANTA) 200-200-20 MG/5ML suspension 30 mL  30 mL Oral Q4H PRN  Starkes-Perry, Takia S, FNP       HYDROcodone -acetaminophen  (NORCO/VICODIN) 5-325 MG per tablet 1-2 tablet  1-2 tablet Oral Q4H PRN Starkes-Perry, Takia S, FNP       magnesium  hydroxide (MILK OF MAGNESIA) suspension 30 mL  30 mL Oral Daily PRN Starkes-Perry, Takia S, FNP       multivitamin with minerals tablet 1 tablet  1 tablet Oral Daily Wilkie Majel RAMAN, FNP   1 tablet at 06/17/24 1001   OLANZapine  (ZYPREXA ) injection 5 mg  5 mg Intramuscular TID PRN Wilkie Majel RAMAN, FNP       OLANZapine  (ZYPREXA ) tablet 15 mg  15 mg Oral BID Kailey Esquilin, MD   15 mg at 06/17/24 1001   OLANZapine  zydis (ZYPREXA ) disintegrating tablet 5 mg  5 mg Oral TID PRN Starkes-Perry, Takia S, FNP       ondansetron  (ZOFRAN ) tablet 4 mg  4 mg Oral Q6H PRN Starkes-Perry, Majel RAMAN, FNP       Or   ondansetron  (ZOFRAN ) injection 4 mg  4 mg Intravenous Q6H PRN Starkes-Perry, Majel RAMAN, FNP       thiamine  (VITAMIN B1) tablet 100 mg  100 mg Oral Daily Wilkie Majel RAMAN, FNP   100 mg at 06/17/24 1001   traZODone  (DESYREL ) tablet 50 mg  50 mg Oral QHS Starkes-Perry, Takia S, FNP   50 mg at 06/16/24 2114    Lab Results:  No results found for this or any previous visit (from the past 48 hours).   Blood Alcohol level:  Lab Results  Component Value Date   [facility] <15 05/02/2024   ETH <15 05/01/2024    Metabolic Disorder Labs: Lab Results  Component Value Date   HGBA1C 5.6 05/14/2024   MPG 114.02 05/14/2024   MPG 116.89 05/02/2024   Lab Results  Component Value Date   PROLACTIN 13.6 04/16/2015   Lab Results  Component Value Date   CHOL 219 (H) 05/14/2024   TRIG 84 05/14/2024   HDL 83 05/14/2024   CHOLHDL 2.6 05/14/2024   VLDL 17 05/14/2024   LDLCALC 119 (H) 05/14/2024   LDLCALC 130 (H) 05/01/2024    Physical Findings: AIMS:  , ,  ,  ,    CIWA:    COWS:      Psychiatric Specialty Exam:  Presentation  General Appearance:  Casual  Eye Contact: Fair  Speech: Normal rate  Speech  Volume: Increased    Mood and Affect  Mood: Angry  Affect: Congruent   Thought Process  Thought Processes: Disorganized  Descriptions of Associations:Tangential  Orientation:Partial  Thought Content:Illogical; Tangential  Hallucinations:RESPONDING TO INTERNAL STIMULI Ideas of Reference:Paranoia; Delusions; Percusatory  Suicidal Thoughts:not endorsing Homicidal Thoughts:nor endorsing  Sensorium  Memory: Recent Poor  Judgment: Poor  Insight: Poor   Executive Functions  Concentration: Poor  Attention Span: Poor  Recall: Poor  Fund of Knowledge: Poor  Language: Poor   Psychomotor Activity  Psychomotor Activity:No data recorded Musculoskeletal: Strength & Muscle Tone:  within normal limits Gait & Station: normal Assets  Assets: Resilience    Physical Exam: Physical Exam Vitals and nursing note reviewed.    ROS Blood pressure (!) 117/47, pulse (!) 50, temperature 97.6 F (36.4 C), resp. rate 17, height 5' 4 (1.626 m), weight 44.9 kg, SpO2 100%. Body mass index is 16.99 kg/m.  Diagnosis: Active Problems:   Schizophrenia, paranoid (HCC) Paranoid  PLAN: Safety and Monitoring:  -- Involuntary admission to inpatient psychiatric unit for safety, stabilization and treatment  -- Daily contact with patient to assess and evaluate symptoms and progress in treatment  -- Patient's case to be discussed in multi-disciplinary team meeting  -- Observation Level : q15 minute checks  -- Vital signs:  q12 hours  -- Precautions: suicide, elopement, and assault -- Encouraged patient to participate in unit milieu and in scheduled group therapies  2. Psychiatric Diagnoses and Treatment: Patient  2 physicians approved non emergent forced medications-Dr.Madaram and Dr.Katianne Barre  Zyprexa  15 mg BID oral or Im -non emergent forced medication-patient is taking her oral medication Will add second antipsychotic haldol   to help with the residual psychotic  symptoms like Prolixin  once consent is received.-Waiting to hear from DSS caseworker for consent as brother already consented . Discharge Planning:   -- Social work and case management to assist with discharge planning and identification of hospital follow-up needs prior to discharge  -- Estimated LOS: 3-4 days  Allyn Foil, MD 06/17/2024, 2:06 PM

## 2024-06-17 NOTE — Group Note (Signed)
 Date:  06/17/2024 Time:  11:30 AM  Group Topic/Focus:  Coping With Mental Health Crisis:   The purpose of this group is to help patients identify strategies for coping with mental health crisis.  Group discusses possible causes of crisis and ways to manage them effectively. Conflict Resolution:   The focus of this group is to discuss the conflict resolution process and how it may be used upon discharge. Healthy Communication:   The focus of this group is to discuss communication, barriers to communication, as well as healthy ways to communicate with others.    Participation Level:  Did Not Attend  Participation Quality:    Affect:    Cognitive:    Insight:   Engagement in Group:    Modes of Intervention:    Additional Comments:    [name] [name] 06/17/2024, 11:30 AM

## 2024-06-18 NOTE — Progress Notes (Signed)
 D- Patient alert and oriented x 3. Pt disoriented to situation. Pt presents with a pleasant mood and affect. Denies SI, HI, AVH, and pain. Pt states she will not take a shower or brush her teeth. Pt states,  there is something in the water . Pt goal is to be discharged.   A- Scheduled medications administered to patient, per MD orders. Support and encouragement provided.  Routine safety checks conducted every 15 minutes.  Patient informed to notify staff with problems or concerns.  R- No adverse drug reactions noted. Patient contracts for safety at this time. Patient compliant with medications and treatment plan. Patient receptive, calm, and cooperative. Patient did not interacts with others on the unit.  Patient remains safe at this time.    Jasmain Ahlberg S.,RN

## 2024-06-18 NOTE — Group Note (Signed)
 Date:  06/18/2024 Time:  9:11 PM  Group Topic/Focus:  Wrap-Up Group:   The focus of this group is to help patients review their daily goal of treatment and discuss progress on daily workbooks.    Participation Level:  Did Not Attend  Participation Quality:     Affect:     Cognitive:     Insight: None  Engagement in Group:  None  Modes of Intervention:     Additional Comments:    [name] 06/18/2024, 9:11 PM

## 2024-06-18 NOTE — Plan of Care (Signed)
  Problem: Education: Goal: Verbalization of understanding the information provided will improve Outcome: Not Progressing   Problem: Activity: Goal: Interest or engagement in activities will improve Outcome: Not Progressing   Problem: Coping: Goal: Ability to verbalize frustrations and anger appropriately will improve Outcome: Not Progressing

## 2024-06-18 NOTE — Group Note (Signed)
 LCSW Group Therapy Note  Group Date: 06/17/2024 Start Time: 1330 End Time: 1400   Type of Therapy and Topic:  Group Therapy - How To Cope with Nervousness about Discharge   Participation Level:  Did Not Attend   Description of Group This process group involved identification of patients' feelings about discharge. Some of them are scheduled to be discharged soon, while others are new admissions, but each of them was asked to share thoughts and feelings surrounding discharge from the hospital. One common theme was that they are excited at the prospect of going home, while another was that many of them are apprehensive about sharing why they were hospitalized. Patients were given the opportunity to discuss these feelings with their peers in preparation for discharge.  Therapeutic Goals  Patient will identify their overall feelings about pending discharge. Patient will think about how they might proactively address issues that they believe will once again arise once they get home (i.e. with parents). Patients will participate in discussion about having hope for change.   Summary of Patient Progress:  X   Therapeutic Modalities Cognitive Behavioral Therapy   Sandra Tellefsen D Adiva Boettner, LCSWA 06/18/2024  6:36 AM

## 2024-06-18 NOTE — Group Note (Signed)
 Date:  06/18/2024 Time:  10:53 AM  Group Topic/Focus:  Goals/Boundaries Group:   The focus of this group is to help patients establish daily goals to achieve during treatment and discuss how the patient can incorporate goal setting into their daily lives to aide in recovery. Patients also learned the different boundaries types and discussed which categories that applied to them.     Participation Level:  Did Not Attend  [name] 06/18/2024, 10:53 AM

## 2024-06-18 NOTE — BH IP Treatment Plan (Signed)
 Interdisciplinary Treatment and Diagnostic Plan Update  06/18/2024 Time of Session: 3:00 PM  [name] MRN: [id number]  Principal Diagnosis: <principal problem not specified>  Secondary Diagnoses: Active Problems:   Schizophrenia, paranoid (HCC)   Current Medications:  Current Facility-Administered Medications  Medication Dose Route Frequency Provider Last Rate Last Admin   acetaminophen  (TYLENOL ) tablet 650 mg  650 mg Oral Q6H PRN Starkes-Perry, Takia S, FNP       alum & mag hydroxide-simeth (MAALOX/MYLANTA) 200-200-20 MG/5ML suspension 30 mL  30 mL Oral Q4H PRN Starkes-Perry, Takia S, FNP       HYDROcodone -acetaminophen  (NORCO/VICODIN) 5-325 MG per tablet 1-2 tablet  1-2 tablet Oral Q4H PRN Starkes-Perry, Takia S, FNP       magnesium  hydroxide (MILK OF MAGNESIA) suspension 30 mL  30 mL Oral Daily PRN Starkes-Perry, Takia S, FNP       multivitamin with minerals tablet 1 tablet  1 tablet Oral Daily Starkes-Perry, Takia S, FNP   1 tablet at 06/18/24 0930   OLANZapine  (ZYPREXA ) injection 5 mg  5 mg Intramuscular TID PRN Wilkie Majel RAMAN, FNP       OLANZapine  (ZYPREXA ) tablet 15 mg  15 mg Oral BID Jadapalle, Sree, MD   15 mg at 06/18/24 0930   OLANZapine  zydis (ZYPREXA ) disintegrating tablet 5 mg  5 mg Oral TID PRN Starkes-Perry, Takia S, FNP       ondansetron  (ZOFRAN ) tablet 4 mg  4 mg Oral Q6H PRN Starkes-Perry, Majel RAMAN, FNP       Or   ondansetron  (ZOFRAN ) injection 4 mg  4 mg Intravenous Q6H PRN Starkes-Perry, Majel RAMAN, FNP       thiamine  (VITAMIN B1) tablet 100 mg  100 mg Oral Daily Starkes-Perry, Takia S, FNP   100 mg at 06/18/24 0930   traZODone  (DESYREL ) tablet 50 mg  50 mg Oral QHS Starkes-Perry, Takia S, FNP   50 mg at 06/17/24 2111   PTA Medications: Medications Prior to Admission  Medication Sig Dispense Refill Last Dose/Taking   feeding supplement (ENSURE PLUS HIGH PROTEIN) LIQD Take 237 mLs by mouth 3 (three) times daily between meals.      Multiple Vitamin  (MULTIVITAMIN WITH MINERALS) TABS tablet Take 1 tablet by mouth daily.      OLANZapine  (ZYPREXA ) 5 MG tablet Take 1 tablet (5 mg total) by mouth 2 (two) times daily.      OLANZapine  (ZYPREXA ) injection Inject 1 mL (5 mg total) into the muscle 2 (two) times daily.      thiamine  (VITAMIN B-1) 100 MG tablet Take 1 tablet (100 mg total) by mouth daily.      traZODone  (DESYREL ) 50 MG tablet Take 1 tablet (50 mg total) by mouth at bedtime.       Patient Stressors:    Patient Strengths:    Treatment Modalities: Medication Management, Group therapy, Case management,  1 to 1 session with clinician, Psychoeducation, Recreational therapy.   Physician Treatment Plan for Primary Diagnosis: <principal problem not specified> Long Term Goal(s): Improvement in symptoms so as ready for discharge   Short Term Goals: Ability to identify changes in lifestyle to reduce recurrence of condition will improve  Medication Management: Evaluate patient's response, side effects, and tolerance of medication regimen.  Therapeutic Interventions: 1 to 1 sessions, Unit Group sessions and Medication administration.  Evaluation of Outcomes: Not Progressing  Physician Treatment Plan for Secondary Diagnosis: Active Problems:   Schizophrenia, paranoid (HCC)  Long Term Goal(s): Improvement in symptoms so as ready for discharge  Short Term Goals: Ability to identify changes in lifestyle to reduce recurrence of condition will improve     Medication Management: Evaluate patient's response, side effects, and tolerance of medication regimen.  Therapeutic Interventions: 1 to 1 sessions, Unit Group sessions and Medication administration.  Evaluation of Outcomes: Not Progressing   RN Treatment Plan for Primary Diagnosis: <principal problem not specified> Long Term Goal(s): Knowledge of disease and therapeutic regimen to maintain health will improve  Short Term Goals: Ability to remain free from injury will improve,  Ability to verbalize frustration and anger appropriately will improve, Ability to demonstrate self-control, Ability to participate in decision making will improve, Ability to verbalize feelings will improve, Ability to disclose and discuss suicidal ideas, Ability to identify and develop effective coping behaviors will improve, and Compliance with prescribed medications will improve  Medication Management: RN will administer medications as ordered by provider, will assess and evaluate patient's response and provide education to patient for prescribed medication. RN will report any adverse and/or side effects to prescribing provider.  Therapeutic Interventions: 1 on 1 counseling sessions, Psychoeducation, Medication administration, Evaluate responses to treatment, Monitor vital signs and CBGs as ordered, Perform/monitor CIWA, COWS, AIMS and Fall Risk screenings as ordered, Perform wound care treatments as ordered.  Evaluation of Outcomes: Not Progressing   LCSW Treatment Plan for Primary Diagnosis: <principal problem not specified> Long Term Goal(s): Safe transition to appropriate next level of care at discharge, Engage patient in therapeutic group addressing interpersonal concerns.  Short Term Goals: Engage patient in aftercare planning with referrals and resources, Increase social support, Increase ability to appropriately verbalize feelings, Increase emotional regulation, Facilitate acceptance of mental health diagnosis and concerns, Facilitate patient progression through stages of change regarding substance use diagnoses and concerns, Identify triggers associated with mental health/substance abuse issues, and Increase skills for wellness and recovery  Therapeutic Interventions: Assess for all discharge needs, 1 to 1 time with Social worker, Explore available resources and support systems, Assess for adequacy in community support network, Educate family and significant other(s) on suicide prevention,  Complete Psychosocial Assessment, Interpersonal group therapy.  Evaluation of Outcomes: Not Progressing   Progress in Treatment: Attending groups: No. Participating in groups: No. Taking medication as prescribed: Yes.  Toleration medication: Yes.  Family/Significant other contact made: No, will contact:  legal guardian  Patient understands diagnosis: No.  Discussing patient identified problems/goals with staff: Yes.  Medical problems stabilized or resolved: Yes.  Denies suicidal/homicidal ideation: Yes.  Issues/concerns per patient self-inventory: No.  Other: None   New problem(s) identified: No, Describe:  None identified. Update: 05/13/2024 No changes at this time. Update 05/18/2024:  No changes at this time. Update 05/23/2024:  No changes at this time. Update 05/28/2024:  No changes at this time. Update 06/02/2024:  No changes at this time. Update 06/07/2024:  No changes at this time. 06/12/24 Update: Patient has been isolative to her room with minimal interaction. Update 06/18/24:  No changes at this time      New Short Term/Long Term Goal(s): elimination of symptoms of psychosis, medication management for mood stabilization; elimination of SI thoughts; development of comprehensive mental wellness plan. Update: 05/13/2024 No changes at this time.Update: 05/18/2024 No changes at this time. Update 05/23/2024:  No changes at this time. Update 05/28/2024:  No changes at this time. Update 06/02/2024:  No changes at this time. Update 06/07/2024:  No changes at this time. 06/12/24 Update: No changes at this time. Update 06/18/24:  No changes at this time  Patient Goals:  I just need to leave, I don't need it hereUpdate: 05/13/2024 No changes at this time.Update: 05/18/2024 No changes at this time. Update 05/23/2024:  No changes at this time.Update 05/28/2024:  No changes at this time. Update 06/02/2024:  No changes at this time. Update 06/07/2024:  No changes at this time. 06/12/24  Update: No changes at this time. Update 06/18/24:  No changes at this time    Discharge Plan or Barriers:  Discharge Plan or Barriers: CSW will assist with appropriate discharge planning. Update: 05/13/2024 No changes at this time.Update: 05/18/2024 No changes at this time. Update 05/23/2024:  No changes at this time.Update 05/28/2024:  CSW unable to identify appropriate decision maker, as pt's legal guardian has passed and CSW has reached out to APS caseworker multiple times to get clarity on protocol for enforcing medication until an appropriate party has been legal identified Update 06/02/2024:  No changes at this time. Update 06/07/2024:  CSW and provider still working to clarify appropriate horticulturist, commercial. Please refer to extensive notes in medical record. 06/12/24 Update: CSW and provider still working with DSS to obtain legal decision maker on patient's behalf. Update 06/18/24:  No changes at this time    Reason for Continuation of Hospitalization: Aggression Hallucinations Medication stabilization    Estimated Length of Stay: 1 to 7 days. Update: 05/13/2024 TBDUpdate: 05/18/2024 TBD Update 05/23/2024:  TBD Update 05/28/2024:  TBDUpdate 06/02/2024:  TBD Update 06/07/2024:  TBD 06/12/24 Update: TBD. Update 06/18/24:  TBD  Last 3 [place] Suicide Severity Risk Score: Flowsheet Row Admission (Current) from 05/06/2024 in [facility] to Hosp-Admission (Discharged) from 05/02/2024 in [facility] ED from 04/30/2024 in [facility]  C-SSRS RISK CATEGORY No Risk No Risk No Risk    Last [facility] 2/9 Scores:     No data to display          Scribe for Treatment Team: [name] JONETTA Raynaldo ISRAEL 06/18/2024 9:39 AM

## 2024-06-18 NOTE — Progress Notes (Signed)
   06/18/24 0012  Psych Admission Type (Psych Patients Only)  Admission Status Involuntary  Psychosocial Assessment  Patient Complaints None  Eye Contact None  Facial Expression Flat  Affect Flat  Speech Soft  Interaction Guarded  Motor Activity Slow  Appearance/Hygiene In scrubs  Behavior Characteristics Cooperative  Mood Empty  Thought Process  Coherency WDL  Content WDL  Delusions None reported or observed  Perception WDL  Hallucination None reported or observed  Judgment Impaired  Confusion Mild  Danger to Self  Current suicidal ideation? Denies  Description of Suicide Plan n/a  Self-Injurious Behavior No self-injurious ideation or behavior indicators observed or expressed   Agreement Not to Harm Self Yes  Description of Agreement verbal  Danger to Others  Danger to Others None reported or observed    Estimated Sleeping Duration (Last 24 Hours): 9.50-12.00 hours

## 2024-06-18 NOTE — Progress Notes (Signed)
 Phone call back [facility] MD Progress Note  06/18/2024 6:52 PM [name]  MRN:  [id number]  [name] is a 63 y.o. female admitted: Medicallyfor 05/02/2024 10:56 AM for dehydration, failure to thrive and hypoglycemia. She carries the psychiatric diagnoses of Schizophrenia and has medical hx of hypoglycemia and failure to thrive and has a past medical history of sinus bradycardia, chronic anemia, severe malnutrition and recurrent hypoglycemia.. Her current presentation of verbal aggression, agitation and refusal to eat with severe paranoia is most consistent with paranoid schizophrenia. [name]  She was at [facility] where she was admitted on 04/30/2024.  She was transferred to the ED because of failure to thrive and was admitted to medical floor.Patient is on IVC and patient has been adamantly refusing all oral medications and refusing to eat.  Patient is admitted to [facility] unit with Q15 min safety monitoring. Multidisciplinary team approach is offered. Medication management; group/milieu therapy is offered.    Subjective:  Chart reviewed, case discussed in multidisciplinary meeting, patient seen during rounds.    Patient is noted to be standing in her room, pacing and talking to herself.  She remains paranoid and delusional and refuses to answer any questions related to hallucinations, her mood, the need for medications or discharge planning.  She remains hostile and demands letting her go but is unable to give details about her family or the house she can go to. Past Psychiatric History: see h&P Family History: History reviewed. No pertinent family history. Social History:  Social History   Substance and Sexual Activity  Alcohol Use Not Currently   Comment: Refuses to disclose how much     Social History   Substance and Sexual Activity  Drug Use No   Comment: Refuses to answer    Social History   Socioeconomic History   Marital status: Single    Spouse name: Not on file   Number of children:  Not on file   Years of education: Not on file   Highest education level: Not on file  Occupational History   Not on file  Tobacco Use   Smoking status: Every Day    Current packs/day: 2.00    Types: Cigarettes    Passive exposure: Current   Smokeless tobacco: Never  Vaping Use   Vaping status: Never Used  Substance and Sexual Activity   Alcohol use: Not Currently    Comment: Refuses to disclose how much   Drug use: No    Comment: Refuses to answer   Sexual activity: Not Currently    Comment: refused to answer  Other Topics Concern   Not on file  Social History Narrative   Not on file   Social Drivers of Health   Financial Resource Strain: Not on file  Food Insecurity: Patient Declined (05/06/2024)   Hunger Vital Sign    Worried About Running Out of Food in the Last Year: Patient declined    Ran Out of Food in the Last Year: Patient declined  Transportation Needs: Patient Declined (05/06/2024)   PRAPARE - Administrator, Civil Service (Medical): Patient declined    Lack of Transportation (Non-Medical): Patient declined  Physical Activity: Not on file  Stress: Not on file  Social Connections: Not on file   Past Medical History:  Past Medical History:  Diagnosis Date   Anemia 10/02/2021   Non compliance w medication regimen    Schizophrenia (HCC)     Past Surgical History:  Procedure Laterality Date   BIOPSY  09/25/2021   Procedure: BIOPSY;  Surgeon: Teressa Toribio SQUIBB, MD;  Location: [facility];  Service: Gastroenterology;;   ESOPHAGOGASTRODUODENOSCOPY (EGD) WITH PROPOFOL  [name] 09/25/2021   Procedure: ESOPHAGOGASTRODUODENOSCOPY (EGD) WITH PROPOFOL ;  Surgeon: Teressa Toribio SQUIBB, MD;  Location: [facility];  Service: Gastroenterology;  Laterality: [name];  With NGT placement    Current Medications: Current Facility-Administered Medications  Medication Dose Route Frequency Provider Last Rate Last Admin   acetaminophen  (TYLENOL ) tablet 650 mg  650 mg Oral Q6H PRN  Starkes-Perry, Takia S, FNP       alum & mag hydroxide-simeth (MAALOX/MYLANTA) 200-200-20 MG/5ML suspension 30 mL  30 mL Oral Q4H PRN Starkes-Perry, Takia S, FNP       HYDROcodone -acetaminophen  (NORCO/VICODIN) 5-325 MG per tablet 1-2 tablet  1-2 tablet Oral Q4H PRN Starkes-Perry, Takia S, FNP       magnesium  hydroxide (MILK OF MAGNESIA) suspension 30 mL  30 mL Oral Daily PRN Starkes-Perry, Takia S, FNP       multivitamin with minerals tablet 1 tablet  1 tablet Oral Daily Starkes-Perry, Takia S, FNP   1 tablet at 06/18/24 0930   OLANZapine  (ZYPREXA ) injection 5 mg  5 mg Intramuscular TID PRN Starkes-Perry, Takia S, FNP       OLANZapine  (ZYPREXA ) tablet 15 mg  15 mg Oral BID Jovannie Ulibarri, MD   15 mg at 06/18/24 0930   OLANZapine  zydis (ZYPREXA ) disintegrating tablet 5 mg  5 mg Oral TID PRN Starkes-Perry, Takia S, FNP       ondansetron  (ZOFRAN ) tablet 4 mg  4 mg Oral Q6H PRN Starkes-Perry, Majel RAMAN, FNP       Or   ondansetron  (ZOFRAN ) injection 4 mg  4 mg Intravenous Q6H PRN Starkes-Perry, Majel RAMAN, FNP       thiamine  (VITAMIN B1) tablet 100 mg  100 mg Oral Daily Starkes-Perry, Takia S, FNP   100 mg at 06/18/24 0930   traZODone  (DESYREL ) tablet 50 mg  50 mg Oral QHS Starkes-Perry, Takia S, FNP   50 mg at 06/17/24 2111    Lab Results:  No results found for this or any previous visit (from the past 48 hours).   Blood Alcohol level:  Lab Results  Component Value Date   [facility] <15 05/02/2024   ETH <15 05/01/2024    Metabolic Disorder Labs: Lab Results  Component Value Date   HGBA1C 5.6 05/14/2024   MPG 114.02 05/14/2024   MPG 116.89 05/02/2024   Lab Results  Component Value Date   PROLACTIN 13.6 04/16/2015   Lab Results  Component Value Date   CHOL 219 (H) 05/14/2024   TRIG 84 05/14/2024   HDL 83 05/14/2024   CHOLHDL 2.6 05/14/2024   VLDL 17 05/14/2024   LDLCALC 119 (H) 05/14/2024   LDLCALC 130 (H) 05/01/2024    Physical Findings: AIMS:  , ,  ,  ,    CIWA:    COWS:       Psychiatric Specialty Exam:  Presentation  General Appearance:  Casual  Eye Contact: Fair  Speech: Normal rate  Speech Volume: Increased    Mood and Affect  Mood: Angry  Affect: Congruent   Thought Process  Thought Processes: Disorganized  Descriptions of Associations:Tangential  Orientation:Partial  Thought Content:Illogical; Tangential  Hallucinations:RESPONDING TO INTERNAL STIMULI Ideas of Reference:Paranoia; Delusions; Percusatory  Suicidal Thoughts:not endorsing Homicidal Thoughts:nor endorsing  Sensorium  Memory: Recent Poor  Judgment: Poor  Insight: Poor   Executive Functions  Concentration: Poor  Attention Span: Poor  Recall: Poor  Fund of Knowledge: Poor  Language: Poor   Psychomotor Activity  Psychomotor Activity:No data recorded Musculoskeletal: Strength & Muscle Tone: within normal limits Gait & Station: normal Assets  Assets: Resilience    Physical Exam: Physical Exam Vitals and nursing note reviewed.    ROS Blood pressure 109/68, pulse 65, temperature 97.8 F (36.6 C), resp. rate 17, height 5' 4 (1.626 m), weight 44.9 kg, SpO2 100%. Body mass index is 16.99 kg/m.  Diagnosis: Active Problems:   Schizophrenia, paranoid (HCC) Paranoid  PLAN: Safety and Monitoring:  -- Involuntary admission to inpatient psychiatric unit for safety, stabilization and treatment  -- Daily contact with patient to assess and evaluate symptoms and progress in treatment  -- Patient's case to be discussed in multi-disciplinary team meeting  -- Observation Level : q15 minute checks  -- Vital signs:  q12 hours  -- Precautions: suicide, elopement, and assault -- Encouraged patient to participate in unit milieu and in scheduled group therapies  2. Psychiatric Diagnoses and Treatment: Patient  2 physicians approved non emergent forced medications-Dr.Madaram and Dr.Madden Piazza  Zyprexa  15 mg BID oral or Im -non emergent forced  medication-patient is taking her oral medication Will add second antipsychotic haldol   to help with the residual psychotic symptoms like Prolixin  once consent is received.-Waiting to hear from DSS caseworker for consent as brother already consented . Discharge Planning:   -- Social work and case management to assist with discharge planning and identification of hospital follow-up needs prior to discharge  -- Estimated LOS: 3-4 days  Allyn Foil, MD 06/18/2024, 6:52 PM

## 2024-06-18 NOTE — Plan of Care (Signed)
  Problem: Education: Goal: Mental status will improve Outcome: Progressing   Problem: Activity: Goal: Sleeping patterns will improve Outcome: Progressing   

## 2024-06-19 NOTE — Plan of Care (Signed)
  Problem: Coping: Goal: Ability to demonstrate self-control will improve Outcome: Progressing   Problem: Safety: Goal: Ability to remain free from injury will improve Outcome: Progressing   Problem: Activity: Goal: Interest or engagement in activities will improve Outcome: Not Progressing

## 2024-06-19 NOTE — Progress Notes (Signed)
 Phone call back [facility] MD Progress Note  06/19/2024 9:02 PM [name]  MRN:  [id number]  [name] is a 63 y.o. female admitted: Medicallyfor 05/02/2024 10:56 AM for dehydration, failure to thrive and hypoglycemia. She carries the psychiatric diagnoses of Schizophrenia and has medical hx of hypoglycemia and failure to thrive and has a past medical history of sinus bradycardia, chronic anemia, severe malnutrition and recurrent hypoglycemia.. Her current presentation of verbal aggression, agitation and refusal to eat with severe paranoia is most consistent with paranoid schizophrenia. [name]  She was at [facility] where she was admitted on 04/30/2024.  She was transferred to the ED because of failure to thrive and was admitted to medical floor.Patient is on IVC and patient has been adamantly refusing all oral medications and refusing to eat.  Patient is admitted to [facility] unit with Q15 min safety monitoring. Multidisciplinary team approach is offered. Medication management; group/milieu therapy is offered.    Subjective:  Chart reviewed, case discussed in multidisciplinary meeting, patient seen during rounds.    Patient is noted to be standing in her room.  She is noted to be irritable and minimally engaging in interview.  She offers no complaints.  She continues to refuse to answer questions related to mental health.  She is actively noticed to be responding to internal stimuli as staff and the provider can hear her talking to her also door is closed.  Patient is not endorsing/SI/ Past Psychiatric History: see h&P Family History: History reviewed. No pertinent family history. Social History:  Social History   Substance and Sexual Activity  Alcohol Use Not Currently   Comment: Refuses to disclose how much     Social History   Substance and Sexual Activity  Drug Use No   Comment: Refuses to answer    Social History   Socioeconomic History   Marital status: Single    Spouse name: Not on file    Number of children: Not on file   Years of education: Not on file   Highest education level: Not on file  Occupational History   Not on file  Tobacco Use   Smoking status: Every Day    Current packs/day: 2.00    Types: Cigarettes    Passive exposure: Current   Smokeless tobacco: Never  Vaping Use   Vaping status: Never Used  Substance and Sexual Activity   Alcohol use: Not Currently    Comment: Refuses to disclose how much   Drug use: No    Comment: Refuses to answer   Sexual activity: Not Currently    Comment: refused to answer  Other Topics Concern   Not on file  Social History Narrative   Not on file   Social Drivers of Health   Financial Resource Strain: Not on file  Food Insecurity: Patient Declined (05/06/2024)   Hunger Vital Sign    Worried About Running Out of Food in the Last Year: Patient declined    Ran Out of Food in the Last Year: Patient declined  Transportation Needs: Patient Declined (05/06/2024)   PRAPARE - Administrator, Civil Service (Medical): Patient declined    Lack of Transportation (Non-Medical): Patient declined  Physical Activity: Not on file  Stress: Not on file  Social Connections: Not on file   Past Medical History:  Past Medical History:  Diagnosis Date   Anemia 10/02/2021   Non compliance w medication regimen    Schizophrenia (HCC)     Past Surgical History:  Procedure Laterality Date   BIOPSY  09/25/2021   Procedure: BIOPSY;  Surgeon: Teressa Toribio SQUIBB, MD;  Location: [facility];  Service: Gastroenterology;;   ESOPHAGOGASTRODUODENOSCOPY (EGD) WITH PROPOFOL  [name] 09/25/2021   Procedure: ESOPHAGOGASTRODUODENOSCOPY (EGD) WITH PROPOFOL ;  Surgeon: Teressa Toribio SQUIBB, MD;  Location: [facility];  Service: Gastroenterology;  Laterality: [name];  With NGT placement    Current Medications: Current Facility-Administered Medications  Medication Dose Route Frequency Provider Last Rate Last Admin   acetaminophen  (TYLENOL ) tablet 650 mg   650 mg Oral Q6H PRN Starkes-Perry, Takia S, FNP       alum & mag hydroxide-simeth (MAALOX/MYLANTA) 200-200-20 MG/5ML suspension 30 mL  30 mL Oral Q4H PRN Starkes-Perry, Takia S, FNP       HYDROcodone -acetaminophen  (NORCO/VICODIN) 5-325 MG per tablet 1-2 tablet  1-2 tablet Oral Q4H PRN Starkes-Perry, Takia S, FNP       magnesium  hydroxide (MILK OF MAGNESIA) suspension 30 mL  30 mL Oral Daily PRN Starkes-Perry, Takia S, FNP       multivitamin with minerals tablet 1 tablet  1 tablet Oral Daily Starkes-Perry, Takia S, FNP   1 tablet at 06/19/24 9074   OLANZapine  (ZYPREXA ) injection 5 mg  5 mg Intramuscular TID PRN Wilkie Majel RAMAN, FNP       OLANZapine  (ZYPREXA ) tablet 15 mg  15 mg Oral BID Quashaun Lazalde, MD   15 mg at 06/19/24 0925   OLANZapine  zydis (ZYPREXA ) disintegrating tablet 5 mg  5 mg Oral TID PRN Starkes-Perry, Takia S, FNP       ondansetron  (ZOFRAN ) tablet 4 mg  4 mg Oral Q6H PRN Starkes-Perry, Majel RAMAN, FNP       Or   ondansetron  (ZOFRAN ) injection 4 mg  4 mg Intravenous Q6H PRN Starkes-Perry, Majel RAMAN, FNP       thiamine  (VITAMIN B1) tablet 100 mg  100 mg Oral Daily Wilkie Majel RAMAN, FNP   100 mg at 06/19/24 9074   traZODone  (DESYREL ) tablet 50 mg  50 mg Oral QHS Starkes-Perry, Takia S, FNP   50 mg at 06/18/24 2115    Lab Results:  No results found for this or any previous visit (from the past 48 hours).   Blood Alcohol level:  Lab Results  Component Value Date   [facility] <15 05/02/2024   ETH <15 05/01/2024    Metabolic Disorder Labs: Lab Results  Component Value Date   HGBA1C 5.6 05/14/2024   MPG 114.02 05/14/2024   MPG 116.89 05/02/2024   Lab Results  Component Value Date   PROLACTIN 13.6 04/16/2015   Lab Results  Component Value Date   CHOL 219 (H) 05/14/2024   TRIG 84 05/14/2024   HDL 83 05/14/2024   CHOLHDL 2.6 05/14/2024   VLDL 17 05/14/2024   LDLCALC 119 (H) 05/14/2024   LDLCALC 130 (H) 05/01/2024    Physical Findings: AIMS:  , ,  ,  ,    CIWA:     COWS:      Psychiatric Specialty Exam:  Presentation  General Appearance:  Casual  Eye Contact: Fair  Speech: Normal rate  Speech Volume: Increased    Mood and Affect  Mood: Angry  Affect: Congruent   Thought Process  Thought Processes: Disorganized  Descriptions of Associations:Tangential  Orientation:Partial  Thought Content:Illogical; Tangential  Hallucinations:RESPONDING TO INTERNAL STIMULI Ideas of Reference:Paranoia; Delusions; Percusatory  Suicidal Thoughts:not endorsing Homicidal Thoughts:nor endorsing  Sensorium  Memory: Recent Poor  Judgment: Poor  Insight: Poor   Executive Functions  Concentration: Poor  Attention Span: Poor  Recall: Poor  Fund of Knowledge: Poor  Language: Poor   Psychomotor Activity  Psychomotor Activity:No data recorded Musculoskeletal: Strength & Muscle Tone: within normal limits Gait & Station: normal Assets  Assets: Resilience    Physical Exam: Physical Exam Vitals and nursing note reviewed.    ROS Blood pressure 100/61, pulse 60, temperature 98.2 F (36.8 C), resp. rate 16, height 5' 4 (1.626 m), weight 44.9 kg, SpO2 100%. Body mass index is 16.99 kg/m.  Diagnosis: Active Problems:   Schizophrenia, paranoid (HCC) Paranoid  PLAN: Safety and Monitoring:  -- Involuntary admission to inpatient psychiatric unit for safety, stabilization and treatment  -- Daily contact with patient to assess and evaluate symptoms and progress in treatment  -- Patient's case to be discussed in multi-disciplinary team meeting  -- Observation Level : q15 minute checks  -- Vital signs:  q12 hours  -- Precautions: suicide, elopement, and assault -- Encouraged patient to participate in unit milieu and in scheduled group therapies  2. Psychiatric Diagnoses and Treatment: Patient  2 physicians approved non emergent forced medications-Dr.Madaram and Dr.Shariq Puig  Zyprexa  15 mg BID oral or Im -non  emergent forced medication-patient is taking her oral medication Will add second antipsychotic haldol   to help with the residual psychotic symptoms like Prolixin  once consent is received.-Waiting to hear from DSS caseworker for consent as brother already consented . Discharge Planning:   -- Social work and case management to assist with discharge planning and identification of hospital follow-up needs prior to discharge  -- Estimated LOS: 3-4 days  Allyn Foil, MD 06/19/2024, 9:02 PM

## 2024-06-19 NOTE — Plan of Care (Signed)
  Problem: Education: Goal: Mental status will improve Outcome: Progressing   Problem: Activity: Goal: Sleeping patterns will improve Outcome: Progressing   

## 2024-06-19 NOTE — Progress Notes (Signed)
 Behavior:  Cooperative with medications and VS.   Psych assessment:  Denies SI.  UTA further.  Interaction / Group attendance:  Isolates.  Does not attend groups. Minimal interaction with peers and staff.   Medication/ PRNs: Compliant.  Pain: Denies  15 min checks in place for safety.

## 2024-06-19 NOTE — Group Note (Signed)
 Date:  06/19/2024 Time:  4:27 PM  Group Topic/Focus:  Coping With Mental Health Crisis:   The purpose of this group is to help patients identify strategies for coping with mental health crisis.  Group discusses possible causes of crisis and ways to manage them effectively.    Participation Level:  Minimal  Participation Quality:  Present but did not participate.   Affect:  Flat  Cognitive:  Lacking  Insight: None  Engagement in Group:  None  Modes of Intervention:  None  Additional Comments:  PT took group material, but provided no feedback.   Hayes Rehfeldt 06/19/2024, 4:27 PM

## 2024-06-19 NOTE — Group Note (Signed)
 Recreation Therapy Group Note   Group Topic:Other  Group Date: 06/19/2024 Start Time: 1400 End Time: 1445 Facilitators: Celestia Jeoffrey BRAVO, LRT, CTRS Location: Dayroom  Activity Description/Intervention: Therapeutic Drumming. Patients with peers and staff were given the opportunity to engage in a leader facilitated [facility] Group Empowerment Drumming Circle with staff from the [employer], in partnership with [employer]. Teaching laboratory technician and trained [employer], Norleen Mon leading with LRT observing and documenting intervention and pt response. This evidenced-based practice targets 7 areas of health and wellbeing in the human experience including: stress-reduction, exercise, self-expression, camaraderie/support, nurturing, spirituality, and music-making (leisure).    Goal Area(s) Addresses:  Patient will engage in pro-social way in music group.  Patient will follow directions of drum leader on the first prompt. Patient will demonstrate no behavioral issues during group.  Patient will identify if a reduction in stress level occurs as a result of participation in therapeutic drum circle.   Affect/Mood: [name]   Participation Level: Did not attend    Clinical Observations/Individualized Feedback: Patient did not attend group.   Plan: Continue to engage patient in RT group sessions 2-3x/week.   Jeoffrey BRAVO Celestia, LRT, CTRS 06/19/2024 4:37 PM

## 2024-06-19 NOTE — Group Note (Signed)
 Date:  06/19/2024 Time:  5:59 PM  Group Topic/Focus:   Christmas coloring  Coloring Relieves Stress Coloring is a stimulating activity that provides many health benefits similar to meditation. It relaxes the mind and focuses attention on the present moment, which may help get rid of negative thoughts and ease the stress of daily life Coloring improves fine motor skills and hand-eye coordination, which are crucial for maintaining dexterity in older adults. Engaging in creative activities like coloring stimulates cognitive function, aiding in memory retention and problem-solving skills.  Participation Level:  Did Not Attend   Harlene LITTIE Gavel 06/19/2024, 5:59 PM

## 2024-06-19 NOTE — Group Note (Signed)
 Date:  06/19/2024 Time:  9:49 PM  Group Topic/Focus:  Wrap-Up Group:   The focus of this group is to help patients review their daily goal of treatment and discuss progress on daily workbooks.    Participation Level:  Did Not Attend  Participation Quality:     Affect:     Cognitive:     Insight: None  Engagement in Group:    Modes of Intervention:    Additional Comments:    [name] [name] 06/19/2024, 9:49 PM

## 2024-06-19 NOTE — Progress Notes (Signed)
   06/18/24 2313  Psych Admission Type (Psych Patients Only)  Admission Status Involuntary  Psychosocial Assessment  Patient Complaints None  Eye Contact Fair  Facial Expression Flat  Affect Flat  Speech Soft  Interaction No initiation  Motor Activity Other (Comment)  Appearance/Hygiene In scrubs  Behavior Characteristics Cooperative;Appropriate to situation  Mood Other (Comment)  Thought Process  Coherency WDL  Content Delusions  Delusions Other (Comment)  Perception WDL  Hallucination None reported or observed  Judgment Impaired  Confusion Mild  Danger to Self  Current suicidal ideation? Denies  Description of Suicide Plan n/a  Self-Injurious Behavior No self-injurious ideation or behavior indicators observed or expressed   Agreement Not to Harm Self Yes  Description of Agreement verbal  Danger to Others  Danger to Others None reported or observed    Estimated Sleeping Duration (Last 24 Hours): 8.25-9.75 hours

## 2024-06-20 NOTE — Progress Notes (Signed)
 Behavior:  Cooperative with medications and VS.  Respectful to staff.   Psych assessment: Denies SI.  Interaction / Group attendance:  Isolates to room.  Minimal interaction with peers and staff.  Does not attend groups.  Medication/ PRNs: Compliant.   Pain: Denies.  15 min checks in place for safety.

## 2024-06-20 NOTE — Progress Notes (Signed)
 Phone call back [facility] MD Progress Note  06/20/2024 1:03 PM [name]  MRN:  [id number]  [name] is a 63 y.o. female admitted: Medicallyfor 05/02/2024 10:56 AM for dehydration, failure to thrive and hypoglycemia. She carries the psychiatric diagnoses of Schizophrenia and has medical hx of hypoglycemia and failure to thrive and has a past medical history of sinus bradycardia, chronic anemia, severe malnutrition and recurrent hypoglycemia.. Her current presentation of verbal aggression, agitation and refusal to eat with severe paranoia is most consistent with paranoid schizophrenia. [name]  She was at [facility] where she was admitted on 04/30/2024.  She was transferred to the ED because of failure to thrive and was admitted to medical floor.Patient is on IVC and patient has been adamantly refusing all oral medications and refusing to eat.  Patient is admitted to [facility] unit with Q15 min safety monitoring. Multidisciplinary team approach is offered. Medication management; group/milieu therapy is offered.    Subjective:  Chart reviewed, case discussed in multidisciplinary meeting, patient seen during rounds.    Patient is noted to be sitting in bed and minimally engaged in interview.  Provider tried to discuss discharge planning, her resources being a veteran and her benefits.  Patient remains hostile.  She is not endorsing SI/HI.  She is noted to be responding to internal stimuli.  Per nursing she is taking her medicine.  We are waiting for legal guardian to be appointed this week so that decisions on optimizing her medications can be made as patient is not meeting criteria for nonemergent forced medication and spite of being floridly psychotic Past Psychiatric History: see h&P Family History: History reviewed. No pertinent family history. Social History:  Social History   Substance and Sexual Activity  Alcohol Use Not Currently   Comment: Refuses to disclose how much     Social History   Substance  and Sexual Activity  Drug Use No   Comment: Refuses to answer    Social History   Socioeconomic History   Marital status: Single    Spouse name: Not on file   Number of children: Not on file   Years of education: Not on file   Highest education level: Not on file  Occupational History   Not on file  Tobacco Use   Smoking status: Every Day    Current packs/day: 2.00    Types: Cigarettes    Passive exposure: Current   Smokeless tobacco: Never  Vaping Use   Vaping status: Never Used  Substance and Sexual Activity   Alcohol use: Not Currently    Comment: Refuses to disclose how much   Drug use: No    Comment: Refuses to answer   Sexual activity: Not Currently    Comment: refused to answer  Other Topics Concern   Not on file  Social History Narrative   Not on file   Social Drivers of Health   Financial Resource Strain: Not on file  Food Insecurity: Patient Declined (05/06/2024)   Hunger Vital Sign    Worried About Running Out of Food in the Last Year: Patient declined    Ran Out of Food in the Last Year: Patient declined  Transportation Needs: Patient Declined (05/06/2024)   PRAPARE - Administrator, Civil Service (Medical): Patient declined    Lack of Transportation (Non-Medical): Patient declined  Physical Activity: Not on file  Stress: Not on file  Social Connections: Not on file   Past Medical History:  Past Medical History:  Diagnosis  Date   Anemia 10/02/2021   Non compliance w medication regimen    Schizophrenia [facility])     Past Surgical History:  Procedure Laterality Date   BIOPSY  09/25/2021   Procedure: BIOPSY;  Surgeon: Teressa Toribio SQUIBB, MD;  Location: [facility];  Service: Gastroenterology;;   ESOPHAGOGASTRODUODENOSCOPY (EGD) WITH PROPOFOL  [name] 09/25/2021   Procedure: ESOPHAGOGASTRODUODENOSCOPY (EGD) WITH PROPOFOL ;  Surgeon: Teressa Toribio SQUIBB, MD;  Location: [facility];  Service: Gastroenterology;  Laterality: [name];  With NGT placement     Current Medications: Current Facility-Administered Medications  Medication Dose Route Frequency Provider Last Rate Last Admin   acetaminophen  (TYLENOL ) tablet 650 mg  650 mg Oral Q6H PRN Starkes-Perry, Takia S, FNP       alum & mag hydroxide-simeth (MAALOX/MYLANTA) 200-200-20 MG/5ML suspension 30 mL  30 mL Oral Q4H PRN Starkes-Perry, Takia S, FNP       HYDROcodone -acetaminophen  (NORCO/VICODIN) 5-325 MG per tablet 1-2 tablet  1-2 tablet Oral Q4H PRN Starkes-Perry, Takia S, FNP       magnesium  hydroxide (MILK OF MAGNESIA) suspension 30 mL  30 mL Oral Daily PRN Starkes-Perry, Takia S, FNP       multivitamin with minerals tablet 1 tablet  1 tablet Oral Daily Wilkie Majel RAMAN, FNP   1 tablet at 06/20/24 0857   OLANZapine  (ZYPREXA ) injection 5 mg  5 mg Intramuscular TID PRN Wilkie Majel RAMAN, FNP       OLANZapine  (ZYPREXA ) tablet 15 mg  15 mg Oral BID Tyger Oka, MD   15 mg at 06/20/24 9141   OLANZapine  zydis (ZYPREXA ) disintegrating tablet 5 mg  5 mg Oral TID PRN Starkes-Perry, Takia S, FNP       ondansetron  (ZOFRAN ) tablet 4 mg  4 mg Oral Q6H PRN Starkes-Perry, Majel RAMAN, FNP       Or   ondansetron  (ZOFRAN ) injection 4 mg  4 mg Intravenous Q6H PRN Starkes-Perry, Takia S, FNP       thiamine  (VITAMIN B1) tablet 100 mg  100 mg Oral Daily Wilkie Majel RAMAN, FNP   100 mg at 06/20/24 0857   traZODone  (DESYREL ) tablet 50 mg  50 mg Oral QHS Starkes-Perry, Takia S, FNP   50 mg at 06/19/24 2128    Lab Results:  No results found for this or any previous visit (from the past 48 hours).   Blood Alcohol level:  Lab Results  Component Value Date   [facility] <15 05/02/2024   ETH <15 05/01/2024    Metabolic Disorder Labs: Lab Results  Component Value Date   HGBA1C 5.6 05/14/2024   MPG 114.02 05/14/2024   MPG 116.89 05/02/2024   Lab Results  Component Value Date   PROLACTIN 13.6 04/16/2015   Lab Results  Component Value Date   CHOL 219 (H) 05/14/2024   TRIG 84 05/14/2024    HDL 83 05/14/2024   CHOLHDL 2.6 05/14/2024   VLDL 17 05/14/2024   LDLCALC 119 (H) 05/14/2024   LDLCALC 130 (H) 05/01/2024    Physical Findings: AIMS:  , ,  ,  ,    CIWA:    COWS:      Psychiatric Specialty Exam:  Presentation  General Appearance:  Casual  Eye Contact: Fair  Speech: Normal rate  Speech Volume: Increased    Mood and Affect  Mood: Angry  Affect: Congruent   Thought Process  Thought Processes: Disorganized  Descriptions of Associations:Tangential  Orientation:Partial  Thought Content:Illogical; Tangential  Hallucinations:RESPONDING TO INTERNAL STIMULI Ideas of Reference:Paranoia; Delusions; Percusatory  Suicidal  Thoughts:not endorsing Homicidal Thoughts:nor endorsing  Sensorium  Memory: Recent Poor  Judgment: Poor  Insight: Poor   Executive Functions  Concentration: Poor  Attention Span: Poor  Recall: Poor  Fund of Knowledge: Poor  Language: Poor   Psychomotor Activity  Psychomotor Activity:No data recorded Musculoskeletal: Strength & Muscle Tone: within normal limits Gait & Station: normal Assets  Assets: Resilience    Physical Exam: Physical Exam Vitals and nursing note reviewed.    ROS Blood pressure 106/72, pulse 70, temperature 98.2 F (36.8 C), resp. rate 16, height 5' 4 (1.626 m), weight 44.9 kg, SpO2 99%. Body mass index is 16.99 kg/m.  Diagnosis: Active Problems:   Schizophrenia, paranoid (HCC) Paranoid  PLAN: Safety and Monitoring:  -- Involuntary admission to inpatient psychiatric unit for safety, stabilization and treatment  -- Daily contact with patient to assess and evaluate symptoms and progress in treatment  -- Patient's case to be discussed in multi-disciplinary team meeting  -- Observation Level : q15 minute checks  -- Vital signs:  q12 hours  -- Precautions: suicide, elopement, and assault -- Encouraged patient to participate in unit milieu and in scheduled group  therapies  2. Psychiatric Diagnoses and Treatment: Patient  2 physicians approved non emergent forced medications-Dr.Madaram and Dr.Otniel Hoe  Zyprexa  15 mg BID oral or Im -non emergent forced medication-patient is taking her oral medication Will add second antipsychotic haldol   to help with the residual psychotic symptoms like Prolixin  once consent is received.-Waiting to hear from DSS caseworker for consent as brother already consented . Discharge Planning:   -- Social work and case management to assist with discharge planning and identification of hospital follow-up needs prior to discharge  -- Estimated LOS: 3-4 days  Allyn Foil, MD 06/20/2024, 1:03 PM

## 2024-06-20 NOTE — Group Note (Signed)
 Date:  06/20/2024 Time:  9:20 PM  Group Topic/Focus:  Wrap-Up Group:   The focus of this group is to help patients review their daily goal of treatment and discuss progress on daily workbooks.    Participation Level:  Did Not Attend  Participation Quality:     Affect:     Cognitive:     Insight: None  Engagement in Group:  None  Modes of Intervention:     Additional Comments:    [name] 06/20/2024, 9:20 PM

## 2024-06-20 NOTE — Plan of Care (Signed)
   Problem: Education: Goal: Mental status will improve Outcome: Progressing   Problem: Coping: Goal: Ability to verbalize frustrations and anger appropriately will improve Outcome: Progressing

## 2024-06-20 NOTE — Group Note (Signed)
 Date:  06/20/2024 Time:  6:14 PM  Group Topic/Focus:  Coping With Mental Health Crisis:   The purpose of this group is to help patients identify strategies for coping with mental health crisis.  Group discusses possible causes of crisis and ways to manage them effectively.    Participation Level:  Did Not Attend  Participation Quality:    Affect:    Cognitive:    Insight:   Engagement in Group:    Modes of Intervention:    Additional Comments:    Leiann Sporer 06/20/2024, 6:14 PM

## 2024-06-20 NOTE — Group Note (Signed)
 LCSW Group Therapy Note  Group Date: 06/20/2024 Start Time: 1315 End Time: 1345   Type of Therapy and Topic:  Group Therapy: Self-Harm Alternatives  Participation Level:  Did Not Attend   Description of Group:   Patients participated in a discussion regarding non-suicidal self-injurious behavior (NSSIB, or self-harm) and the stigma surrounding it. There was also discussion surrounding how other maladaptive coping skills could be seen as self-harm, such as substance abuse. Participants were invited to share their experiences with self-harm, with emphasis being placed on the motivation for self-harm (such as release, punishment, feeling numb, etc). Patients were then asked to brainstorm potential substitutions for self-harm and were provided with a handout entitled, Distraction Techniques and Alternative Coping Strategies, published by [employer] Self-Injury Recovery.  Therapeutic Goals:  Patients will be given the opportunity to discuss NSSIB in a non-judgmental and therapeutic environment. Patients will identify which feelings lead to NSSIB.  Patients will discuss potential healthy coping skills to replace NSSIB Open discussion will specifically address stigma and shame surrounding NSSIB.   Summary of Patient Progress:  X  Therapeutic Modalities:   Cognitive Behavioral Therapy   [name] JONETTA Raynaldo ISRAEL 06/20/2024  2:17 PM

## 2024-06-20 NOTE — Progress Notes (Signed)
 Patient was calm and cooperative. Denied anxiety, depression, SI, HI & AVH.   06/19/24 2200  Psych Admission Type (Psych Patients Only)  Admission Status Involuntary  Psychosocial Assessment  Patient Complaints None  Eye Contact Brief  Facial Expression Flat  Affect Flat  Speech Logical/coherent  Interaction Minimal  Motor Activity Restless  Appearance/Hygiene In scrubs  Behavior Characteristics Cooperative;Appropriate to situation  Mood Preoccupied  Thought Process  Coherency WDL  Content WDL  Delusions None reported or observed  Perception WDL  Hallucination None reported or observed  Judgment Impaired  Confusion Mild  Danger to Self  Current suicidal ideation? Denies  Agreement Not to Harm Self Yes  Description of Agreement verbal  Danger to Others  Danger to Others None reported or observed

## 2024-06-20 NOTE — Group Note (Signed)
 Date:  06/20/2024 Time:  6:24 PM  Group Topic/Focus:  Managing Feelings:   The focus of this group is to identify what feelings patients have difficulty handling and develop a plan to handle them in a healthier way upon discharge.    Participation Level:  Did Not Attend  Participation Quality:     Affect:     Cognitive:    Insight: None  Engagement in Group:    Modes of Intervention:     Additional Comments:    Yerachmiel Spinney K 06/20/2024, 6:24 PM

## 2024-06-20 NOTE — Plan of Care (Signed)
   Problem: Activity: Goal: Interest or engagement in activities will improve Outcome: Progressing   Problem: Coping: Goal: Ability to demonstrate self-control will improve Outcome: Progressing

## 2024-06-20 NOTE — Group Note (Signed)
 Recreation Therapy Group Note   Group Topic:Relaxation  Group Date: 06/20/2024 Start Time: 1400 End Time: 1440 Facilitators: Celestia Jeoffrey BRAVO, LRT, CTRS Location: Dayroom  Group Description: PMR (Progressive Muscle Relaxation). LRT educates patients on what PMR is and the benefits that come from it. Patients are asked to sit with their feet flat on the floor while sitting up and all the way back in their chair, if possible. LRT and pts follow a prompt through a speaker that requires you to tense and release different muscles in their body and focus on their breathing. During session, lights are off and soft music is being played. Pts are given a stress ball to use if needed.   Goal Area(s) Addressed:  Patients will be able to describe progressive muscle relaxation.  Patient will practice using relaxation technique. Patient will identify a new coping skill.  Patient will follow multistep directions to reduce anxiety and stress.   Affect/Mood: [name]   Participation Level: Did not attend    Clinical Observations/Individualized Feedback: Patient did not attend group.   Plan: Continue to engage patient in RT group sessions 2-3x/week.   Jeoffrey BRAVO Celestia, LRT, CTRS 06/20/2024 4:49 PM

## 2024-06-21 NOTE — Group Note (Signed)
 Date:  06/21/2024 Time:  3:26 PM  Group Topic/Focus:  Movement therapy    Participation Level:  Did Not Attend   [name] Bias 06/21/2024, 3:26 PM

## 2024-06-21 NOTE — Progress Notes (Signed)
   06/20/24 2100  Psych Admission Type (Psych Patients Only)  Admission Status Involuntary  Psychosocial Assessment  Patient Complaints None  Eye Contact Avoids  Facial Expression Flat  Affect Flat  Speech Soft  Interaction Guarded;Minimal  Motor Activity Restless;Pacing  Appearance/Hygiene In scrubs  Behavior Characteristics Cooperative;Appropriate to situation  Mood Pleasant  Thought Process  Coherency WDL  Content WDL  Delusions None reported or observed  Perception WDL  Hallucination None reported or observed  Judgment Impaired  Confusion Mild  Danger to Self  Current suicidal ideation? Denies  Self-Injurious Behavior No self-injurious ideation or behavior indicators observed or expressed   Agreement Not to Harm Self Yes  Description of Agreement verbal  Danger to Others  Danger to Others None reported or observed   Mood/Behavior:  Pleasant and cooperative.    Psych assessment: Denies SI/HI and AVH.  Pt responding no.   Interaction / Group attendance: Isolative to self in room. No interaction observed with peers. Minimal interaction with staff.  Did not attend group.   Medication/ PRNs: Compliant with scheduled medications. Required PRNs Trazodone  for sleep and noted effective.   Pain: Denies  15 min checks in place for safety.

## 2024-06-21 NOTE — Progress Notes (Signed)
 Phone call back [facility] MD Progress Note  06/21/2024 4:00 PM Mavi Un  MRN:  [id number]  Yazmyne Sara is a 63 y.o. female admitted: [facility] 05/02/2024 10:56 AM for dehydration, failure to thrive and hypoglycemia. She carries the psychiatric diagnoses of Schizophrenia and has medical hx of hypoglycemia and failure to thrive and has a past medical history of sinus bradycardia, chronic anemia, severe malnutrition and recurrent hypoglycemia.. Her current presentation of verbal aggression, agitation and refusal to eat with severe paranoia is most consistent with paranoid schizophrenia. [name]  She was at [facility] where she was admitted on 04/30/2024.  She was transferred to the ED because of failure to thrive and was admitted to medical floor.Patient is on IVC and patient has been adamantly refusing all oral medications and refusing to eat.  Patient is admitted to [facility] unit with Q15 min safety monitoring. Multidisciplinary team approach is offered. Medication management; group/milieu therapy is offered.    Subjective:  Chart reviewed, case discussed in multidisciplinary meeting, patient seen during rounds.   Patient is noted to be sitting in her room.  She remains discharge focused.  She denies SI/HI/plan and denies hallucinations but is actively noted to be responding to stimuli when she is in her room by herself with close door staff he is able to hear her talking to herself.  APS reached out to the team and informed that emergency guardianship has been obtained and treatment options can be discussed through email with the caseworker. Past Psychiatric History: see h&P Family History: History reviewed. No pertinent family history. Social History:  Social History   Substance and Sexual Activity  Alcohol Use Not Currently   Comment: Refuses to disclose how much     Social History   Substance and Sexual Activity  Drug Use No   Comment: Refuses to answer    Social History   Socioeconomic History    Marital status: Single    Spouse name: Not on file   Number of children: Not on file   Years of education: Not on file   Highest education level: Not on file  Occupational History   Not on file  Tobacco Use   Smoking status: Every Day    Current packs/day: 2.00    Types: Cigarettes    Passive exposure: Current   Smokeless tobacco: Never  Vaping Use   Vaping status: Never Used  Substance and Sexual Activity   Alcohol use: Not Currently    Comment: Refuses to disclose how much   Drug use: No    Comment: Refuses to answer   Sexual activity: Not Currently    Comment: refused to answer  Other Topics Concern   Not on file  Social History Narrative   Not on file   Social Drivers of Health   Financial Resource Strain: Not on file  Food Insecurity: Patient Declined (05/06/2024)   Hunger Vital Sign    Worried About Running Out of Food in the Last Year: Patient declined    Ran Out of Food in the Last Year: Patient declined  Transportation Needs: Patient Declined (05/06/2024)   PRAPARE - Administrator, Civil Service (Medical): Patient declined    Lack of Transportation (Non-Medical): Patient declined  Physical Activity: Not on file  Stress: Not on file  Social Connections: Not on file   Past Medical History:  Past Medical History:  Diagnosis Date   Anemia 10/02/2021   Non compliance w medication regimen    Schizophrenia (HCC)  Past Surgical History:  Procedure Laterality Date   BIOPSY  09/25/2021   Procedure: BIOPSY;  Surgeon: Teressa Toribio SQUIBB, MD;  Location: [facility];  Service: Gastroenterology;;   ESOPHAGOGASTRODUODENOSCOPY (EGD) WITH PROPOFOL  [name] 09/25/2021   Procedure: ESOPHAGOGASTRODUODENOSCOPY (EGD) WITH PROPOFOL ;  Surgeon: Teressa Toribio SQUIBB, MD;  Location: [facility];  Service: Gastroenterology;  Laterality: [name];  With NGT placement    Current Medications: Current Facility-Administered Medications  Medication Dose Route Frequency Provider Last Rate  Last Admin   acetaminophen  (TYLENOL ) tablet 650 mg  650 mg Oral Q6H PRN Starkes-Perry, Takia S, FNP       alum & mag hydroxide-simeth (MAALOX/MYLANTA) 200-200-20 MG/5ML suspension 30 mL  30 mL Oral Q4H PRN Starkes-Perry, Takia S, FNP       HYDROcodone -acetaminophen  (NORCO/VICODIN) 5-325 MG per tablet 1-2 tablet  1-2 tablet Oral Q4H PRN Starkes-Perry, Takia S, FNP       magnesium  hydroxide (MILK OF MAGNESIA) suspension 30 mL  30 mL Oral Daily PRN Starkes-Perry, Takia S, FNP       multivitamin with minerals tablet 1 tablet  1 tablet Oral Daily Wilkie Majel RAMAN, FNP   1 tablet at 06/21/24 9065   OLANZapine  (ZYPREXA ) injection 5 mg  5 mg Intramuscular TID PRN Wilkie Majel RAMAN, FNP       OLANZapine  (ZYPREXA ) tablet 15 mg  15 mg Oral BID Haniah Penny, MD   15 mg at 06/21/24 0935   OLANZapine  zydis (ZYPREXA ) disintegrating tablet 5 mg  5 mg Oral TID PRN Starkes-Perry, Takia S, FNP       ondansetron  (ZOFRAN ) tablet 4 mg  4 mg Oral Q6H PRN Starkes-Perry, Majel RAMAN, FNP       Or   ondansetron  (ZOFRAN ) injection 4 mg  4 mg Intravenous Q6H PRN Starkes-Perry, Majel RAMAN, FNP       thiamine  (VITAMIN B1) tablet 100 mg  100 mg Oral Daily Starkes-Perry, Takia S, FNP   100 mg at 06/21/24 0935   traZODone  (DESYREL ) tablet 50 mg  50 mg Oral QHS Starkes-Perry, Takia S, FNP   50 mg at 06/20/24 2128    Lab Results:  No results found for this or any previous visit (from the past 48 hours).   Blood Alcohol level:  Lab Results  Component Value Date   [facility] <15 05/02/2024   ETH <15 05/01/2024    Metabolic Disorder Labs: Lab Results  Component Value Date   HGBA1C 5.6 05/14/2024   MPG 114.02 05/14/2024   MPG 116.89 05/02/2024   Lab Results  Component Value Date   PROLACTIN 13.6 04/16/2015   Lab Results  Component Value Date   CHOL 219 (H) 05/14/2024   TRIG 84 05/14/2024   HDL 83 05/14/2024   CHOLHDL 2.6 05/14/2024   VLDL 17 05/14/2024   LDLCALC 119 (H) 05/14/2024   LDLCALC 130 (H) 05/01/2024     Physical Findings: AIMS:  , ,  ,  ,    CIWA:    COWS:      Psychiatric Specialty Exam:  Presentation  General Appearance:  Casual  Eye Contact: Fair  Speech: Normal rate  Speech Volume: Increased    Mood and Affect  Mood: Angry  Affect: Congruent   Thought Process  Thought Processes: Disorganized  Descriptions of Associations:Tangential  Orientation:Partial  Thought Content:Illogical; Tangential  Hallucinations:RESPONDING TO INTERNAL STIMULI Ideas of Reference:Paranoia; Delusions; Percusatory  Suicidal Thoughts:not endorsing Homicidal Thoughts:nor endorsing  Sensorium  Memory: Recent Poor  Judgment: Poor  Insight: Poor   Art Therapist  Concentration: Poor  Attention Span: Poor  Recall: Poor  Fund of Knowledge: Poor  Language: Poor   Psychomotor Activity  Psychomotor Activity:No data recorded Musculoskeletal: Strength & Muscle Tone: within normal limits Gait & Station: normal Assets  Assets: Resilience    Physical Exam: Physical Exam Vitals and nursing note reviewed.    ROS Blood pressure 109/63, pulse (!) 54, temperature (!) 97.3 F (36.3 C), resp. rate 14, height 5' 4 (1.626 m), weight 44.9 kg, SpO2 100%. Body mass index is 16.99 kg/m.  Diagnosis: Active Problems:   Schizophrenia, paranoid (HCC) Paranoid  PLAN: Safety and Monitoring:  -- Involuntary admission to inpatient psychiatric unit for safety, stabilization and treatment  -- Daily contact with patient to assess and evaluate symptoms and progress in treatment  -- Patient's case to be discussed in multi-disciplinary team meeting  -- Observation Level : q15 minute checks  -- Vital signs:  q12 hours  -- Precautions: suicide, elopement, and assault -- Encouraged patient to participate in unit milieu and in scheduled group therapies  2. Psychiatric Diagnoses and Treatment: Patient  2 physicians approved non emergent forced  medications-Dr.Madaram and Dr.Tifanny Dollens  Zyprexa  15 mg BID oral or Im -non emergent forced medication-patient is taking her oral medication Will add second antipsychotic haldol   to help with the residual psychotic symptoms like Prolixin  once consent is received.-Waiting to hear from DSS caseworker for consent as brother already consented . Discharge Planning:   -- Social work and case management to assist with discharge planning and identification of hospital follow-up needs prior to discharge  -- Estimated LOS: 3-4 days  Mamye Bolds, MD 06/21/2024, 4:00 PM

## 2024-06-21 NOTE — Progress Notes (Signed)
 12/ 09/2023 Letter to the Court Reg: [name] DOB: 04/22/1961  [name] has been hospitalized since 05/06/2024 at [facility] inpatient psychiatric unit. She is currently being treated for Schizophrenia p[paranoid type with decline in functioning..  Patient is very hostile aggressive, refusing to participate in assessments, refusing to consent for medications, responding to internal stimuli.  APS is fling for guardianship. As this process is time consuming patient has no safe place to go with out supervision.  We appreciate the court's assistance in stabilizing and currently we request another 30 days of inpatient care to further stabilize.  Sincerely, Davi Rotan.MD [facility]

## 2024-06-21 NOTE — Progress Notes (Signed)
 Patient is an involuntary admission to [facility] for schizophrenia awaiting placement.  Patient is cooperative and friendly with staff but mostly isolates in her room only coming out for meals.  Patient dislikes discussing her mental status and says no to all questions about SI, HI, AVH, anxiety and depression. Will continue to monitor.

## 2024-06-21 NOTE — Group Note (Signed)
 Date:  06/21/2024 Time:  11:22 AM  Group Topic/Focus:  Personal Choices and Values:   The focus of this group is to help patients assess and explore the importance of values in their lives, how their values affect their decisions, how they express their values and what opposes their expression.    Participation Level:  Did Not Attend   [name] 06/21/2024, 11:22 AM

## 2024-06-21 NOTE — Group Note (Signed)
 Date:  06/21/2024 Time:  8:40 PM  Group Topic/Focus:  Wrap-Up Group:   The focus of this group is to help patients review their daily goal of treatment and discuss progress on daily workbooks.    Participation Level:  Did Not Attend  Participation Quality:     Affect:     Cognitive:     Insight: None  Engagement in Group:  None  Modes of Intervention:     Additional Comments:    Anand Tejada C Jynesis Nakamura 06/21/2024, 8:40 PM

## 2024-06-21 NOTE — Plan of Care (Signed)
  Problem: Education: Goal: Knowledge of [place] [facility] Education information/materials will improve Outcome: Progressing Goal: Emotional status will improve Outcome: Progressing Goal: Mental status will improve Outcome: Progressing Goal: Verbalization of understanding the information provided will improve Outcome: Progressing   Problem: Activity: Goal: Interest or engagement in activities will improve Outcome: Progressing Goal: Sleeping patterns will improve Outcome: Progressing   Problem: Coping: Goal: Ability to verbalize frustrations and anger appropriately will improve Outcome: Progressing Goal: Ability to demonstrate self-control will improve Outcome: Progressing   Problem: Health Behavior/Discharge Planning: Goal: Identification of resources available to assist in meeting health care needs will improve Outcome: Progressing Goal: Compliance with treatment plan for underlying cause of condition will improve Outcome: Progressing   Problem: Physical Regulation: Goal: Ability to maintain clinical measurements within normal limits will improve Outcome: Progressing   Problem: Safety: Goal: Periods of time without injury will increase Outcome: Progressing   Problem: Education: Goal: Knowledge of General Education information will improve Description: Including pain rating scale, medication(s)/side effects and non-pharmacologic comfort measures Outcome: Progressing   Problem: Health Behavior/Discharge Planning: Goal: Ability to manage health-related needs will improve Outcome: Progressing   Problem: Clinical Measurements: Goal: Ability to maintain clinical measurements within normal limits will improve Outcome: Progressing Goal: Will remain free from infection Outcome: Progressing Goal: Diagnostic test results will improve Outcome: Progressing Goal: Respiratory complications will improve Outcome: Progressing Goal: Cardiovascular complication will be  avoided Outcome: Progressing   Problem: Activity: Goal: Risk for activity intolerance will decrease Outcome: Progressing   Problem: Nutrition: Goal: Adequate nutrition will be maintained Outcome: Progressing   Problem: Coping: Goal: Level of anxiety will decrease Outcome: Progressing   Problem: Elimination: Goal: Will not experience complications related to bowel motility Outcome: Progressing Goal: Will not experience complications related to urinary retention Outcome: Progressing   Problem: Pain Managment: Goal: General experience of comfort will improve and/or be controlled Outcome: Progressing   Problem: Safety: Goal: Ability to remain free from injury will improve Outcome: Progressing   Problem: Skin Integrity: Goal: Risk for impaired skin integrity will decrease Outcome: Progressing   Problem: Health Behavior/Discharge Planning: Goal: Compliance with prescribed medication regimen will improve Outcome: Progressing   Problem: Nutritional: Goal: Ability to achieve adequate nutritional intake will improve Outcome: Progressing

## 2024-06-22 NOTE — Progress Notes (Signed)
   06/22/24 1100  Psych Admission Type (Psych Patients Only)  Admission Status Involuntary  Psychosocial Assessment  Patient Complaints None  Eye Contact Avertive  Facial Expression Flat  Affect Flat  Speech Soft  Interaction Minimal  Motor Activity Pacing  Appearance/Hygiene In scrubs  Behavior Characteristics Cooperative  Mood Pleasant  Thought Process  Coherency WDL  Content WDL  Delusions None reported or observed  Perception WDL  Hallucination None reported or observed  Judgment Impaired  Confusion Mild  Danger to Self  Current suicidal ideation? Denies  Agreement Not to Harm Self Yes  Description of Agreement verbal  Danger to Others  Danger to Others None reported or observed

## 2024-06-22 NOTE — Group Note (Signed)
 Date:  06/22/2024 Time:  10:43 AM  Group Topic/Focus:  Managing Feelings:   The focus of this group is to identify what feelings patients have difficulty handling and develop a plan to handle them in a healthier way upon discharge.    Participation Level:  Did Not Attend   Harlene LITTIE Gavel 06/22/2024, 10:43 AM

## 2024-06-22 NOTE — Plan of Care (Signed)
  Problem: Activity: Goal: Sleeping patterns will improve Outcome: Progressing   Problem: Coping: Goal: Level of anxiety will decrease Outcome: Progressing

## 2024-06-22 NOTE — Progress Notes (Signed)
 Phone call back [facility] MD Progress Note  06/22/2024 10:51 AM [name]  MRN:  [id number]  [name] is a 63 y.o. female admitted: Medicallyfor 05/02/2024 10:56 AM for dehydration, failure to thrive and hypoglycemia. She carries the psychiatric diagnoses of Schizophrenia and has medical hx of hypoglycemia and failure to thrive and has a past medical history of sinus bradycardia, chronic anemia, severe malnutrition and recurrent hypoglycemia.. Her current presentation of verbal aggression, agitation and refusal to eat with severe paranoia is most consistent with paranoid schizophrenia. [name]  She was at [facility] where she was admitted on 04/30/2024.  She was transferred to the ED because of failure to thrive and was admitted to medical floor.Patient is on IVC and patient has been adamantly refusing all oral medications and refusing to eat.  Patient is admitted to [facility] unit with Q15 [name] safety monitoring. Multidisciplinary team approach is offered. Medication management; group/milieu therapy is offered.    Subjective:  Chart reviewed, case discussed in multidisciplinary meeting, patient seen during rounds.    Patient is noted to be resting in bed.  She offers no complaints.  She remains paranoid and does not want to indulge in any details about her family or discharge planning.  She just remains Discharge focused demanding discharge but does not want to engage in any discussions.  She denies any physical complaints and gets upset and refuses to answer any questions related to mental health per nursing she is taking her medications with no reported side effects Past Psychiatric History: see h&P Family History: History reviewed. No pertinent family history. Social History:  Social History   Substance and Sexual Activity  Alcohol Use Not Currently   Comment: Refuses to disclose how much     Social History   Substance and Sexual Activity  Drug Use No   Comment: Refuses to answer    Social History    Socioeconomic History   Marital status: Single    Spouse name: Not on file   Number of children: Not on file   Years of education: Not on file   Highest education level: Not on file  Occupational History   Not on file  Tobacco Use   Smoking status: Every Day    Current packs/day: 2.00    Types: Cigarettes    Passive exposure: Current   Smokeless tobacco: Never  Vaping Use   Vaping status: Never Used  Substance and Sexual Activity   Alcohol use: Not Currently    Comment: Refuses to disclose how much   Drug use: No    Comment: Refuses to answer   Sexual activity: Not Currently    Comment: refused to answer  Other Topics Concern   Not on file  Social History Narrative   Not on file   Social Drivers of Health   Financial Resource Strain: Not on file  Food Insecurity: Patient Declined (05/06/2024)   Hunger Vital Sign    Worried About Running Out of Food in the Last Year: Patient declined    Ran Out of Food in the Last Year: Patient declined  Transportation Needs: Patient Declined (05/06/2024)   PRAPARE - Administrator, Civil Service (Medical): Patient declined    Lack of Transportation (Non-Medical): Patient declined  Physical Activity: Not on file  Stress: Not on file  Social Connections: Not on file   Past Medical History:  Past Medical History:  Diagnosis Date   Anemia 10/02/2021   Non compliance w medication regimen  Schizophrenia [facility])     Past Surgical History:  Procedure Laterality Date   BIOPSY  09/25/2021   Procedure: BIOPSY;  Surgeon: Teressa Toribio SQUIBB, MD;  Location: [facility];  Service: Gastroenterology;;   ESOPHAGOGASTRODUODENOSCOPY (EGD) WITH PROPOFOL  [name] 09/25/2021   Procedure: ESOPHAGOGASTRODUODENOSCOPY (EGD) WITH PROPOFOL ;  Surgeon: Teressa Toribio SQUIBB, MD;  Location: [facility];  Service: Gastroenterology;  Laterality: [name];  With NGT placement    Current Medications: Current Facility-Administered Medications  Medication Dose Route  Frequency Provider Last Rate Last Admin   acetaminophen  (TYLENOL ) tablet 650 mg  650 mg Oral Q6H PRN Starkes-Perry, Takia S, FNP       alum & mag hydroxide-simeth (MAALOX/MYLANTA) 200-200-20 MG/5ML suspension 30 mL  30 mL Oral Q4H PRN Starkes-Perry, Takia S, FNP       HYDROcodone -acetaminophen  (NORCO/VICODIN) 5-325 MG per tablet 1-2 tablet  1-2 tablet Oral Q4H PRN Starkes-Perry, Takia S, FNP       magnesium  hydroxide (MILK OF MAGNESIA) suspension 30 mL  30 mL Oral Daily PRN Starkes-Perry, Takia S, FNP       multivitamin with minerals tablet 1 tablet  1 tablet Oral Daily Starkes-Perry, Takia S, FNP   1 tablet at 06/22/24 0945   OLANZapine  (ZYPREXA ) injection 5 mg  5 mg Intramuscular TID PRN Starkes-Perry, Takia S, FNP       OLANZapine  (ZYPREXA ) tablet 15 mg  15 mg Oral BID Braxten Memmer, MD   15 mg at 06/22/24 0945   OLANZapine  zydis (ZYPREXA ) disintegrating tablet 5 mg  5 mg Oral TID PRN Starkes-Perry, Takia S, FNP       ondansetron  (ZOFRAN ) tablet 4 mg  4 mg Oral Q6H PRN Starkes-Perry, Majel RAMAN, FNP       Or   ondansetron  (ZOFRAN ) injection 4 mg  4 mg Intravenous Q6H PRN Starkes-Perry, Majel RAMAN, FNP       thiamine  (VITAMIN B1) tablet 100 mg  100 mg Oral Daily Starkes-Perry, Takia S, FNP   100 mg at 06/22/24 0946   traZODone  (DESYREL ) tablet 50 mg  50 mg Oral QHS Starkes-Perry, Takia S, FNP   50 mg at 06/21/24 2110    Lab Results:  No results found for this or any previous visit (from the past 48 hours).   Blood Alcohol level:  Lab Results  Component Value Date   [facility] <15 05/02/2024   ETH <15 05/01/2024    Metabolic Disorder Labs: Lab Results  Component Value Date   HGBA1C 5.6 05/14/2024   MPG 114.02 05/14/2024   MPG 116.89 05/02/2024   Lab Results  Component Value Date   PROLACTIN 13.6 04/16/2015   Lab Results  Component Value Date   CHOL 219 (H) 05/14/2024   TRIG 84 05/14/2024   HDL 83 05/14/2024   CHOLHDL 2.6 05/14/2024   VLDL 17 05/14/2024   LDLCALC 119 (H) 05/14/2024    LDLCALC 130 (H) 05/01/2024    Physical Findings: AIMS:  , ,  ,  ,    CIWA:    COWS:      Psychiatric Specialty Exam:  Presentation  General Appearance:  Casual  Eye Contact: Fair  Speech: Normal rate  Speech Volume: Increased    Mood and Affect  Mood: Angry  Affect: Congruent   Thought Process  Thought Processes: Disorganized  Descriptions of Associations:Tangential  Orientation:Partial  Thought Content:Illogical; Tangential  Hallucinations:RESPONDING TO INTERNAL STIMULI Ideas of Reference:Paranoia; Delusions; Percusatory  Suicidal Thoughts:not endorsing Homicidal Thoughts:nor endorsing  Sensorium  Memory: Recent Poor  Judgment: Poor  Insight: Poor   Executive Functions  Concentration: Poor  Attention Span: Poor  Recall: Poor  Fund of Knowledge: Poor  Language: Poor   Psychomotor Activity  Psychomotor Activity:No data recorded Musculoskeletal: Strength & Muscle Tone: within normal limits Gait & Station: normal Assets  Assets: Resilience    Physical Exam: Physical Exam Vitals and nursing note reviewed.    ROS Blood pressure 109/67, pulse 61, temperature 98.1 F (36.7 C), resp. rate 14, height 5' 4 (1.626 m), weight 44.9 kg, SpO2 100%. Body mass index is 16.99 kg/m.  Diagnosis: Active Problems:   Schizophrenia, paranoid (HCC) Paranoid  PLAN: Safety and Monitoring:  -- Involuntary admission to inpatient psychiatric unit for safety, stabilization and treatment  -- Daily contact with patient to assess and evaluate symptoms and progress in treatment  -- Patient's case to be discussed in multi-disciplinary team meeting  -- Observation Level : q15 minute checks  -- Vital signs:  q12 hours  -- Precautions: suicide, elopement, and assault -- Encouraged patient to participate in unit milieu and in scheduled group therapies  2. Psychiatric Diagnoses and Treatment: Patient  2 physicians approved non emergent  forced medications-Dr.Madaram and Dr.Kayleigh Broadwell  Zyprexa  15 mg BID oral or Im -non emergent forced medication-patient is taking her oral medication Will add second antipsychotic haldol   to help with the residual psychotic symptoms like Prolixin  once consent is received.-Waiting to hear from DSS caseworker for consent as brother already consented . Discharge Planning:   -- Social work and case management to assist with discharge planning and identification of hospital follow-up needs prior to discharge  -- Estimated LOS: 3-4 days  Allyn Foil, MD 06/22/2024, 10:51 AM

## 2024-06-22 NOTE — Group Note (Signed)
 Recreation Therapy Group Note   Group Topic:Communication  Group Date: 06/22/2024 Start Time: 1400 End Time: 1450 Facilitators: Celestia Jeoffrey BRAVO, LRT, CTRS Location: Dayroom  Group Description: Reminisce Conversation Cards. Patients drew a laminated card out of a bag that had a word or phrase on it. Pt encouraged to speak about a time in their life or fond memory that specifically relates to the word they chose out of the bag. An example would be: "parenthood, meals, siblings, travel, or home".  LRT prompted following questions and encouraged contribution from peers to increase communication.   Goal Area(s) Addressed: Patient will increase verbal communication by conversing with peers. Patient will contribute to group discussion with minimal prompting. Patient will reminisce a positive memory or moment in their life.   Affect/Mood: [name]   Participation Level: Did not attend    Clinical Observations/Individualized Feedback: Patient did not attend group.   Plan: Continue to engage patient in RT group sessions 2-3x/week.   Jeoffrey BRAVO Celestia, LRT, CTRS 06/22/2024 4:48 PM

## 2024-06-22 NOTE — Group Note (Signed)
 LCSW Group Therapy Note  Group Date: 06/22/2024 Start Time: 1320 End Time: 1445   Type of Therapy and Topic:  Group Therapy - Healthy vs Unhealthy Coping Skills  Participation Level:  Did Not Attend   Description of Group The focus of this group was to determine what unhealthy coping techniques typically are used by group members and what healthy coping techniques would be helpful in coping with various problems. Patients were guided in becoming aware of the differences between healthy and unhealthy coping techniques. Patients were asked to identify 2-3 healthy coping skills they would like to learn to use more effectively.  Therapeutic Goals Patients learned that coping is what human beings do all day long to deal with various situations in their lives Patients defined and discussed healthy vs unhealthy coping techniques Patients identified their preferred coping techniques and identified whether these were healthy or unhealthy Patients determined 2-3 healthy coping skills they would like to become more familiar with and use more often. Patients provided support and ideas to each other   Summary of Patient Progress: X   Therapeutic Modalities Cognitive Behavioral Therapy Motivational Interviewing  [name], [place] 06/22/2024  2:49 PM

## 2024-06-22 NOTE — Progress Notes (Signed)
   06/21/24 2329  Psych Admission Type (Psych Patients Only)  Admission Status Involuntary  Psychosocial Assessment  Patient Complaints None  Eye Contact Avertive  Facial Expression Flat  Affect Flat  Speech Soft  Interaction Minimal  Motor Activity Pacing  Appearance/Hygiene In scrubs  Behavior Characteristics Cooperative  Mood Pleasant  Thought Process  Coherency WDL  Content WDL  Delusions None reported or observed  Perception WDL  Hallucination None reported or observed  Judgment Impaired  Confusion Mild  Danger to Self  Current suicidal ideation? Denies  Description of Suicide Plan n/a  Self-Injurious Behavior No self-injurious ideation or behavior indicators observed or expressed   Agreement Not to Harm Self Yes  Description of Agreement verbal  Danger to Others  Danger to Others None reported or observed

## 2024-06-22 NOTE — Group Note (Signed)
 Date:  06/22/2024 Time:  8:44 PM  Group Topic/Focus:  Wrap-Up Group:   The focus of this group is to help patients review their daily goal of treatment and discuss progress on daily workbooks.    Participation Level:  Did Not Attend  [name] 06/22/2024, 8:44 PM

## 2024-06-23 NOTE — Group Note (Signed)
 Recreation Therapy Group Note   Group Topic:Leisure Education  Group Date: 06/23/2024 Start Time: 1415 End Time: 1440 Facilitators: Celestia Jeoffrey BRAVO, LRT, CTRS Location: Dayroom  Group Description: Music. Patients are encouraged to name their favorite song(s) for LRT to play song through speaker for group to hear, while in the courtyard getting fresh air and sunlight. Patients educated on the definition of leisure and the importance of having different leisure interests outside of the hospital. Group discussed how leisure activities can often be used as pharmacologist and that listening to music and being outside are examples.    Goal Area(s) Addressed:  Patient will identify a current leisure interest.  Patient will practice making a positive decision. Patient will have the opportunity to try a new leisure activity.  Affect/Mood: [name]   Participation Level: Did not attend    Clinical Observations/Individualized Feedback: Patient did not attend group.   Plan: Continue to engage patient in RT group sessions 2-3x/week.   Jeoffrey BRAVO Celestia, LRT, CTRS 06/23/2024 4:59 PM

## 2024-06-23 NOTE — Progress Notes (Signed)
   06/23/24 2127  Psych Admission Type (Psych Patients Only)  Admission Status Involuntary  Psychosocial Assessment  Patient Complaints None  Eye Contact Brief  Facial Expression Flat  Affect Flat  Speech Soft  Interaction Isolative  Motor Activity Slow  Appearance/Hygiene In scrubs  Behavior Characteristics Guarded  Mood Pleasant  Thought Process  Coherency WDL  Content WDL  Delusions None reported or observed  Perception WDL  Hallucination None reported or observed  Judgment Impaired  Confusion Mild  Danger to Self  Current suicidal ideation? Denies  Self-Injurious Behavior No self-injurious ideation or behavior indicators observed or expressed   Agreement Not to Harm Self Yes  Description of Agreement verbal  Danger to Others  Danger to Others None reported or observed

## 2024-06-23 NOTE — Group Note (Signed)
 Date:  06/23/2024 Time:  4:42 PM  Group Topic/Focus:  Rediscovering Joy:   The focus of this group is to explore various ways to relieve stress in a positive manner.    Participation Level:  Did Not Attend  [name] 06/23/2024, 4:42 PM

## 2024-06-23 NOTE — Progress Notes (Signed)
 Phone call back [facility] MD Progress Note  06/23/2024 12:27 PM [name]  MRN:  [id number]  [name] is a 63 y.o. female admitted: Medicallyfor 05/02/2024 10:56 AM for dehydration, failure to thrive and hypoglycemia. She carries the psychiatric diagnoses of Schizophrenia and has medical hx of hypoglycemia and failure to thrive and has a past medical history of sinus bradycardia, chronic anemia, severe malnutrition and recurrent hypoglycemia.. Her current presentation of verbal aggression, agitation and refusal to eat with severe paranoia is most consistent with paranoid schizophrenia. [name]  She was at [facility] where she was admitted on 04/30/2024.  She was transferred to the ED because of failure to thrive and was admitted to medical floor.Patient is on IVC and patient has been adamantly refusing all oral medications and refusing to eat.  Patient is admitted to [facility] unit with Q15 min safety monitoring. Multidisciplinary team approach is offered. Medication management; group/milieu therapy is offered.    Subjective:  Chart reviewed, case discussed in multidisciplinary meeting, patient seen during rounds.    Patient is sitting on the bed minimally engages but does say that she does not have any specific questions or answers at this time no concerns reported no aggressive behaviors waiting for place Past Psychiatric History: see h&P Family History: History reviewed. No pertinent family history. Social History:  Social History   Substance and Sexual Activity  Alcohol Use Not Currently   Comment: Refuses to disclose how much     Social History   Substance and Sexual Activity  Drug Use No   Comment: Refuses to answer    Social History   Socioeconomic History   Marital status: Single    Spouse name: Not on file   Number of children: Not on file   Years of education: Not on file   Highest education level: Not on file  Occupational History   Not on file  Tobacco Use   Smoking status: Every  Day    Current packs/day: 2.00    Types: Cigarettes    Passive exposure: Current   Smokeless tobacco: Never  Vaping Use   Vaping status: Never Used  Substance and Sexual Activity   Alcohol use: Not Currently    Comment: Refuses to disclose how much   Drug use: No    Comment: Refuses to answer   Sexual activity: Not Currently    Comment: refused to answer  Other Topics Concern   Not on file  Social History Narrative   Not on file   Social Drivers of Health   Financial Resource Strain: Not on file  Food Insecurity: Patient Declined (05/06/2024)   Hunger Vital Sign    Worried About Running Out of Food in the Last Year: Patient declined    Ran Out of Food in the Last Year: Patient declined  Transportation Needs: Patient Declined (05/06/2024)   PRAPARE - Administrator, Civil Service (Medical): Patient declined    Lack of Transportation (Non-Medical): Patient declined  Physical Activity: Not on file  Stress: Not on file  Social Connections: Not on file   Past Medical History:  Past Medical History:  Diagnosis Date   Anemia 10/02/2021   Non compliance w medication regimen    Schizophrenia (HCC)     Past Surgical History:  Procedure Laterality Date   BIOPSY  09/25/2021   Procedure: BIOPSY;  Surgeon: Teressa Toribio SQUIBB, MD;  Location: [facility];  Service: Gastroenterology;;   ESOPHAGOGASTRODUODENOSCOPY (EGD) WITH PROPOFOL  [name] 09/25/2021   Procedure:  ESOPHAGOGASTRODUODENOSCOPY (EGD) WITH PROPOFOL ;  Surgeon: Teressa Toribio SQUIBB, MD;  Location: [facility];  Service: Gastroenterology;  Laterality: [name];  With NGT placement    Current Medications: Current Facility-Administered Medications  Medication Dose Route Frequency Provider Last Rate Last Admin   acetaminophen  (TYLENOL ) tablet 650 mg  650 mg Oral Q6H PRN Starkes-Perry, Takia S, FNP       alum & mag hydroxide-simeth (MAALOX/MYLANTA) 200-200-20 MG/5ML suspension 30 mL  30 mL Oral Q4H PRN Starkes-Perry, Takia S, FNP        HYDROcodone -acetaminophen  (NORCO/VICODIN) 5-325 MG per tablet 1-2 tablet  1-2 tablet Oral Q4H PRN Starkes-Perry, Takia S, FNP       magnesium  hydroxide (MILK OF MAGNESIA) suspension 30 mL  30 mL Oral Daily PRN Starkes-Perry, Takia S, FNP       multivitamin with minerals tablet 1 tablet  1 tablet Oral Daily Wilkie Majel RAMAN, FNP   1 tablet at 06/23/24 9072   OLANZapine  (ZYPREXA ) injection 5 mg  5 mg Intramuscular TID PRN Wilkie Majel RAMAN, FNP       OLANZapine  (ZYPREXA ) tablet 15 mg  15 mg Oral BID Jadapalle, Sree, MD   15 mg at 06/23/24 9072   OLANZapine  zydis (ZYPREXA ) disintegrating tablet 5 mg  5 mg Oral TID PRN Starkes-Perry, Takia S, FNP       ondansetron  (ZOFRAN ) tablet 4 mg  4 mg Oral Q6H PRN Wilkie Majel RAMAN, FNP       Or   ondansetron  (ZOFRAN ) injection 4 mg  4 mg Intravenous Q6H PRN Starkes-Perry, Majel RAMAN, FNP       thiamine  (VITAMIN B1) tablet 100 mg  100 mg Oral Daily Starkes-Perry, Takia S, FNP   100 mg at 06/23/24 9071   traZODone  (DESYREL ) tablet 50 mg  50 mg Oral QHS Starkes-Perry, Takia S, FNP   50 mg at 06/22/24 2103    Lab Results:  No results found for this or any previous visit (from the past 48 hours).   Blood Alcohol level:  Lab Results  Component Value Date   [facility] <15 05/02/2024   ETH <15 05/01/2024    Metabolic Disorder Labs: Lab Results  Component Value Date   HGBA1C 5.6 05/14/2024   MPG 114.02 05/14/2024   MPG 116.89 05/02/2024   Lab Results  Component Value Date   PROLACTIN 13.6 04/16/2015   Lab Results  Component Value Date   CHOL 219 (H) 05/14/2024   TRIG 84 05/14/2024   HDL 83 05/14/2024   CHOLHDL 2.6 05/14/2024   VLDL 17 05/14/2024   LDLCALC 119 (H) 05/14/2024   LDLCALC 130 (H) 05/01/2024    Physical Findings: AIMS:  , ,  ,  ,    CIWA:    COWS:      Psychiatric Specialty Exam:  Presentation  General Appearance:  Casual  Eye Contact: Fair  Speech: Normal rate  Speech Volume: Increased    Mood and  Affect  Mood: Angry  Affect: Congruent   Thought Process  Thought Processes: Disorganized  Descriptions of Associations:Tangential  Orientation:Partial  Thought Content:Illogical; Tangential  Hallucinations:RESPONDING TO INTERNAL STIMULI Ideas of Reference:Paranoia; Delusions; Percusatory  Suicidal Thoughts:not endorsing Homicidal Thoughts:nor endorsing  Sensorium  Memory: Recent Poor  Judgment: Poor  Insight: Poor   Executive Functions  Concentration: Poor  Attention Span: Poor  Recall: Poor  Fund of Knowledge: Poor  Language: Poor   Psychomotor Activity  Psychomotor Activity:No data recorded Musculoskeletal: Strength & Muscle Tone: within normal limits Gait & Station: normal Assets  Assets: Resilience    Physical Exam: Physical Exam Vitals and nursing note reviewed.    ROS Blood pressure 99/61, pulse 65, temperature 97.9 F (36.6 C), resp. rate 17, height 5' 4 (1.626 m), weight 44.9 kg, SpO2 99%. Body mass index is 16.99 kg/m.  Diagnosis: Active Problems:   Schizophrenia, paranoid (HCC) Paranoid  PLAN: Safety and Monitoring:  -- Involuntary admission to inpatient psychiatric unit for safety, stabilization and treatment  -- Daily contact with patient to assess and evaluate symptoms and progress in treatment  -- Patient's case to be discussed in multi-disciplinary team meeting  -- Observation Level : q15 minute checks  -- Vital signs:  q12 hours  -- Precautions: suicide, elopement, and assault -- Encouraged patient to participate in unit milieu and in scheduled group therapies  2. Psychiatric Diagnoses and Treatment: Patient  2 physicians approved non emergent forced medications-Dr.Marbella Markgraf and Dr.Jadapalle  Zyprexa  15 mg BID oral or Im -non emergent forced medication-patient is taking her oral medication Will add second antipsychotic haldol   to help with the residual psychotic symptoms like Prolixin  once consent is  received.-Waiting to hear from DSS caseworker for consent as brother already consented . Discharge Planning:   -- Social work and case management to assist with discharge planning and identification of hospital follow-up needs prior to discharge  -- Estimated LOS: 3-4 days  Millie JONELLE Manners, MD 06/23/2024, 12:27 PM

## 2024-06-23 NOTE — Plan of Care (Signed)
  Problem: Education: Goal: Knowledge of [place] [facility] Education information/materials will improve Outcome: Progressing Goal: Emotional status will improve Outcome: Progressing Goal: Mental status will improve Outcome: Progressing Goal: Verbalization of understanding the information provided will improve Outcome: Progressing   Problem: Activity: Goal: Interest or engagement in activities will improve Outcome: Progressing Goal: Sleeping patterns will improve Outcome: Progressing   Problem: Coping: Goal: Ability to verbalize frustrations and anger appropriately will improve Outcome: Progressing Goal: Ability to demonstrate self-control will improve Outcome: Progressing   Problem: Health Behavior/Discharge Planning: Goal: Identification of resources available to assist in meeting health care needs will improve Outcome: Progressing Goal: Compliance with treatment plan for underlying cause of condition will improve Outcome: Progressing   Problem: Physical Regulation: Goal: Ability to maintain clinical measurements within normal limits will improve Outcome: Progressing   Problem: Safety: Goal: Periods of time without injury will increase Outcome: Progressing   Problem: Education: Goal: Knowledge of General Education information will improve Description: Including pain rating scale, medication(s)/side effects and non-pharmacologic comfort measures Outcome: Progressing   Problem: Health Behavior/Discharge Planning: Goal: Ability to manage health-related needs will improve Outcome: Progressing   Problem: Clinical Measurements: Goal: Ability to maintain clinical measurements within normal limits will improve Outcome: Progressing Goal: Will remain free from infection Outcome: Progressing Goal: Diagnostic test results will improve Outcome: Progressing Goal: Respiratory complications will improve Outcome: Progressing Goal: Cardiovascular complication will be  avoided Outcome: Progressing   Problem: Activity: Goal: Risk for activity intolerance will decrease Outcome: Progressing   Problem: Nutrition: Goal: Adequate nutrition will be maintained Outcome: Progressing   Problem: Coping: Goal: Level of anxiety will decrease Outcome: Progressing   Problem: Elimination: Goal: Will not experience complications related to bowel motility Outcome: Progressing Goal: Will not experience complications related to urinary retention Outcome: Progressing   Problem: Pain Managment: Goal: General experience of comfort will improve and/or be controlled Outcome: Progressing   Problem: Safety: Goal: Ability to remain free from injury will improve Outcome: Progressing   Problem: Skin Integrity: Goal: Risk for impaired skin integrity will decrease Outcome: Progressing   Problem: Health Behavior/Discharge Planning: Goal: Compliance with prescribed medication regimen will improve Outcome: Progressing   Problem: Nutritional: Goal: Ability to achieve adequate nutritional intake will improve Outcome: Progressing

## 2024-06-23 NOTE — Progress Notes (Signed)
   06/22/24 2100  Psych Admission Type (Psych Patients Only)  Admission Status Involuntary  Psychosocial Assessment  Patient Complaints None  Eye Contact Brief  Facial Expression Flat  Affect Flat  Speech Soft  Interaction Isolative  Motor Activity Slow  Appearance/Hygiene In scrubs  Behavior Characteristics Guarded  Mood Irritable  Thought Process  Coherency WDL  Content WDL  Delusions None reported or observed  Perception WDL  Hallucination None reported or observed  Judgment Impaired  Confusion Mild  Danger to Self  Current suicidal ideation? Denies

## 2024-06-23 NOTE — Plan of Care (Signed)
   Problem: Education: Goal: Knowledge of [facility] Education information/materials will improve Outcome: Progressing Goal: Emotional status will improve Outcome: Progressing Goal: Mental status will improve Outcome: Progressing Goal: Verbalization of understanding the information provided will improve Outcome: Progressing

## 2024-06-23 NOTE — Progress Notes (Signed)
   06/23/24 1400  Psych Admission Type (Psych Patients Only)  Admission Status Involuntary  Psychosocial Assessment  Patient Complaints None  Eye Contact Brief  Facial Expression Flat  Affect Flat  Speech Soft  Interaction Isolative  Motor Activity Slow  Appearance/Hygiene In scrubs  Behavior Characteristics Guarded  Mood Irritable  Thought Process  Coherency WDL  Content WDL  Delusions None reported or observed  Perception WDL  Hallucination None reported or observed  Judgment Impaired  Confusion Mild  Danger to Self  Current suicidal ideation? Denies  Agreement Not to Harm Self Yes  Description of Agreement verbal  Danger to Others  Danger to Others None reported or observed

## 2024-06-23 NOTE — Group Note (Signed)
 Date:  06/23/2024 Time:  11:02 AM  Group Topic/Focus:  Making Healthy Choices:   The focus of this group is to help patients identify negative/unhealthy choices they were using prior to admission and identify positive/healthier coping strategies to replace them upon discharge.    Participation Level:  Did Not Attend   [name] 06/23/2024, 11:02 AM

## 2024-06-23 NOTE — Group Note (Signed)
 Physical/Occupational Therapy Group Note  Group Topic: Neurographic Art  Group Date: 06/23/2024 Start Time: 1300 End Time: 1400 Facilitators: Clive Warren CROME, OT   Group Description: Group participated with Neurographic art activity, using watercolor paints to facilitate creative expression and meditation/relaxation for each individual.  Incorporated bimanual coordination, mental focus, emotional processing, task/command following and relaxation techniques as appropriate.  Patients engaged socially with therapist and other group participants throughout session. Allowed to ask questions as appropriate, and encouraged to identify ways they could use/share their creations with themselves and others.  Therapeutic Goal(s):  Demonstrate ability to independently manipulate utensils required to participate with and complete activity. Demonstrate ability to cognitively focus on task and follow commands necessary for completion. Demonstrate use of art as an outlet for emotional processing and expression. Identify and demonstrate importance of relaxation, neural calming and meditation for improved participation with life groups.  Individual Participation: Pt did not attend.   Participation Level: Did not attend   Participation Quality:   Behavior:   Speech/Thought Process:   Affect/Mood:   Insight:   Judgement:   Modes of Intervention:   Patient Response to Interventions:    Plan: Continue to engage patient in PT/OT groups 1 - 2x/week.   Demaris Leavell R., MPH, MS, OTR/L ascom [phone] 06/23/24, 2:52 PM

## 2024-06-24 NOTE — Plan of Care (Signed)
  Problem: Coping: Goal: Ability to demonstrate self-control will improve Outcome: Progressing   Problem: Activity: Goal: Interest or engagement in activities will improve Outcome: Not Progressing

## 2024-06-24 NOTE — Progress Notes (Signed)
 Behavior:  Cooperative with medications and VS.  Respectful of staff.   Psych assessment: Denies SI. UTA further.  Interaction / Group attendance:  Pt present in the milieu this afternoon.  Minimal interaction with peers and staff.  Does not attend groups.  Medication/ PRNs: Compliant.  Pain: Denies.  15 min checks in place for safety.

## 2024-06-24 NOTE — Progress Notes (Signed)
 Phone call back [facility] MD Progress Note  06/24/2024 11:59 AM [name]  MRN:  [id number]  [name] is a 63 y.o. female admitted: Medicallyfor 05/02/2024 10:56 AM for dehydration, failure to thrive and hypoglycemia. She carries the psychiatric diagnoses of Schizophrenia and has medical hx of hypoglycemia and failure to thrive and has a past medical history of sinus bradycardia, chronic anemia, severe malnutrition and recurrent hypoglycemia.. Her current presentation of verbal aggression, agitation and refusal to eat with severe paranoia is most consistent with paranoid schizophrenia. [name]  She was at [facility] where she was admitted on 04/30/2024.  She was transferred to the ED because of failure to thrive and was admitted to medical floor.Patient is on IVC and patient has been adamantly refusing all oral medications and refusing to eat.  Patient is admitted to [facility] unit with Q15 min safety monitoring. Multidisciplinary team approach is offered. Medication management; group/milieu therapy is offered.    Subjective:  Chart reviewed, case discussed in multidisciplinary meeting, patient seen during rounds.    Patient is noted to be sitting in the day area.  She eventually went back to her room.  She offers no complaints to the provider.  Per nursing reports she is taking her medications.  She denies having any side effects to medications.  She remains delusional and continues to refuse to engage in any conversations about medications, her mental health and discharge planning.  Provider has reached out to her legal guardian newly appointed through email to get patient started on Haldol  5 mg twice daily to achieve optimal benefit and response to psychosis.  Patient is not endorsing suicidal/homicidal ideation.   Past Psychiatric History: see h&P Family History: History reviewed. No pertinent family history. Social History:  Social History   Substance and Sexual Activity  Alcohol Use Not Currently    Comment: Refuses to disclose how much     Social History   Substance and Sexual Activity  Drug Use No   Comment: Refuses to answer    Social History   Socioeconomic History   Marital status: Single    Spouse name: Not on file   Number of children: Not on file   Years of education: Not on file   Highest education level: Not on file  Occupational History   Not on file  Tobacco Use   Smoking status: Every Day    Current packs/day: 2.00    Types: Cigarettes    Passive exposure: Current   Smokeless tobacco: Never  Vaping Use   Vaping status: Never Used  Substance and Sexual Activity   Alcohol use: Not Currently    Comment: Refuses to disclose how much   Drug use: No    Comment: Refuses to answer   Sexual activity: Not Currently    Comment: refused to answer  Other Topics Concern   Not on file  Social History Narrative   Not on file   Social Drivers of Health   Financial Resource Strain: Not on file  Food Insecurity: Patient Declined (05/06/2024)   Hunger Vital Sign    Worried About Running Out of Food in the Last Year: Patient declined    Ran Out of Food in the Last Year: Patient declined  Transportation Needs: Patient Declined (05/06/2024)   PRAPARE - Administrator, Civil Service (Medical): Patient declined    Lack of Transportation (Non-Medical): Patient declined  Physical Activity: Not on file  Stress: Not on file  Social Connections: Not on file  Past Medical History:  Past Medical History:  Diagnosis Date   Anemia 10/02/2021   Non compliance w medication regimen    Schizophrenia [facility])     Past Surgical History:  Procedure Laterality Date   BIOPSY  09/25/2021   Procedure: BIOPSY;  Surgeon: Teressa Toribio SQUIBB, MD;  Location: [facility];  Service: Gastroenterology;;   ESOPHAGOGASTRODUODENOSCOPY (EGD) WITH PROPOFOL  [name] 09/25/2021   Procedure: ESOPHAGOGASTRODUODENOSCOPY (EGD) WITH PROPOFOL ;  Surgeon: Teressa Toribio SQUIBB, MD;  Location: [facility];   Service: Gastroenterology;  Laterality: [name];  With NGT placement    Current Medications: Current Facility-Administered Medications  Medication Dose Route Frequency Provider Last Rate Last Admin   acetaminophen  (TYLENOL ) tablet 650 mg  650 mg Oral Q6H PRN Starkes-Perry, Takia S, FNP       alum & mag hydroxide-simeth (MAALOX/MYLANTA) 200-200-20 MG/5ML suspension 30 mL  30 mL Oral Q4H PRN Starkes-Perry, Takia S, FNP       HYDROcodone -acetaminophen  (NORCO/VICODIN) 5-325 MG per tablet 1-2 tablet  1-2 tablet Oral Q4H PRN Starkes-Perry, Takia S, FNP       magnesium  hydroxide (MILK OF MAGNESIA) suspension 30 mL  30 mL Oral Daily PRN Starkes-Perry, Takia S, FNP       multivitamin with minerals tablet 1 tablet  1 tablet Oral Daily Wilkie Majel RAMAN, FNP   1 tablet at 06/24/24 9175   OLANZapine  (ZYPREXA ) injection 5 mg  5 mg Intramuscular TID PRN Wilkie Majel RAMAN, FNP       OLANZapine  (ZYPREXA ) tablet 15 mg  15 mg Oral BID Ethelda Deangelo, MD   15 mg at 06/24/24 9175   OLANZapine  zydis (ZYPREXA ) disintegrating tablet 5 mg  5 mg Oral TID PRN Wilkie Majel RAMAN, FNP       ondansetron  (ZOFRAN ) tablet 4 mg  4 mg Oral Q6H PRN Starkes-Perry, Majel RAMAN, FNP       Or   ondansetron  (ZOFRAN ) injection 4 mg  4 mg Intravenous Q6H PRN Starkes-Perry, Majel RAMAN, FNP       thiamine  (VITAMIN B1) tablet 100 mg  100 mg Oral Daily Starkes-Perry, Takia S, FNP   100 mg at 06/24/24 9175   traZODone  (DESYREL ) tablet 50 mg  50 mg Oral QHS Starkes-Perry, Takia S, FNP   50 mg at 06/23/24 2126    Lab Results:  No results found for this or any previous visit (from the past 48 hours).   Blood Alcohol level:  Lab Results  Component Value Date   [facility] <15 05/02/2024   ETH <15 05/01/2024    Metabolic Disorder Labs: Lab Results  Component Value Date   HGBA1C 5.6 05/14/2024   MPG 114.02 05/14/2024   MPG 116.89 05/02/2024   Lab Results  Component Value Date   PROLACTIN 13.6 04/16/2015   Lab Results  Component  Value Date   CHOL 219 (H) 05/14/2024   TRIG 84 05/14/2024   HDL 83 05/14/2024   CHOLHDL 2.6 05/14/2024   VLDL 17 05/14/2024   LDLCALC 119 (H) 05/14/2024   LDLCALC 130 (H) 05/01/2024    Physical Findings: AIMS:  , ,  ,  ,    CIWA:    COWS:      Psychiatric Specialty Exam:  Presentation  General Appearance:  Casual  Eye Contact: Fair  Speech: Normal rate  Speech Volume: Increased    Mood and Affect  Mood: Angry  Affect: Congruent   Thought Process  Thought Processes: Disorganized  Descriptions of Associations:Tangential  Orientation:Partial  Thought Content:Illogical; Tangential  Hallucinations:RESPONDING TO  INTERNAL STIMULI Ideas of Reference:Paranoia; Delusions; Percusatory  Suicidal Thoughts:not endorsing Homicidal Thoughts:nor endorsing  Sensorium  Memory: Recent Poor  Judgment: Poor  Insight: Poor   Executive Functions  Concentration: Poor  Attention Span: Poor  Recall: Poor  Fund of Knowledge: Poor  Language: Poor   Psychomotor Activity  Psychomotor Activity:No data recorded Musculoskeletal: Strength & Muscle Tone: within normal limits Gait & Station: normal Assets  Assets: Resilience    Physical Exam: Physical Exam Vitals and nursing note reviewed.    ROS Blood pressure 107/75, pulse 80, temperature 98.2 F (36.8 C), resp. rate 17, height 5' 4 (1.626 m), weight 44.9 kg, SpO2 96%. Body mass index is 16.99 kg/m.  Diagnosis: Active Problems:   Schizophrenia, paranoid (HCC) Paranoid  PLAN: Safety and Monitoring:  -- Involuntary admission to inpatient psychiatric unit for safety, stabilization and treatment  -- Daily contact with patient to assess and evaluate symptoms and progress in treatment  -- Patient's case to be discussed in multi-disciplinary team meeting  -- Observation Level : q15 minute checks  -- Vital signs:  q12 hours  -- Precautions: suicide, elopement, and assault -- Encouraged  patient to participate in unit milieu and in scheduled group therapies  2. Psychiatric Diagnoses and Treatment: Patient  2 physicians approved non emergent forced medications-Dr.Madaram and Dr.Shrey Boike  Zyprexa  15 mg BID oral or Im -non emergent forced medication-patient is taking her oral medication Will add second antipsychotic haldol   to help with the residual psychotic symptoms like Prolixin  once consent is received.-Waiting to hear from DSS caseworker for consent as brother already consented . Discharge Planning:   -- Social work and case management to assist with discharge planning and identification of hospital follow-up needs prior to discharge  -- Estimated LOS: 3-4 days  Kaimen Peine, MD 06/24/2024, 11:59 AM

## 2024-06-24 NOTE — Group Note (Signed)
 Date:  06/24/2024 Time:  3:29 PM  Group Topic/Focus:  Movement Therapy    Participation Level:  Did Not Attend    [name] Bias 06/24/2024, 3:29 PM

## 2024-06-24 NOTE — Group Note (Signed)
 Date:  06/24/2024 Time:  11:06 AM  Group Topic/Focus:  Coping With Mental Health Crisis:   The purpose of this group is to help patients identify strategies for coping with mental health crisis.  Group discusses possible causes of crisis and ways to manage them effectively.    Participation Level:  Did Not Attend   Maglione,[name] 06/24/2024, 11:06 AM

## 2024-06-24 NOTE — Plan of Care (Signed)
  Problem: Education: Goal: Knowledge of [place] [facility] Education information/materials will improve Outcome: Progressing Goal: Emotional status will improve Outcome: Progressing Goal: Mental status will improve Outcome: Progressing Goal: Verbalization of understanding the information provided will improve Outcome: Progressing   Problem: Activity: Goal: Interest or engagement in activities will improve Outcome: Progressing Goal: Sleeping patterns will improve Outcome: Progressing   Problem: Coping: Goal: Ability to verbalize frustrations and anger appropriately will improve Outcome: Progressing Goal: Ability to demonstrate self-control will improve Outcome: Progressing   Problem: Health Behavior/Discharge Planning: Goal: Identification of resources available to assist in meeting health care needs will improve Outcome: Progressing Goal: Compliance with treatment plan for underlying cause of condition will improve Outcome: Progressing   Problem: Physical Regulation: Goal: Ability to maintain clinical measurements within normal limits will improve Outcome: Progressing   Problem: Safety: Goal: Periods of time without injury will increase Outcome: Progressing   Problem: Education: Goal: Knowledge of General Education information will improve Description: Including pain rating scale, medication(s)/side effects and non-pharmacologic comfort measures Outcome: Progressing   Problem: Health Behavior/Discharge Planning: Goal: Ability to manage health-related needs will improve Outcome: Progressing   Problem: Clinical Measurements: Goal: Ability to maintain clinical measurements within normal limits will improve Outcome: Progressing Goal: Will remain free from infection Outcome: Progressing Goal: Diagnostic test results will improve Outcome: Progressing Goal: Respiratory complications will improve Outcome: Progressing Goal: Cardiovascular complication will be  avoided Outcome: Progressing   Problem: Activity: Goal: Risk for activity intolerance will decrease Outcome: Progressing   Problem: Nutrition: Goal: Adequate nutrition will be maintained Outcome: Progressing   Problem: Coping: Goal: Level of anxiety will decrease Outcome: Progressing   Problem: Elimination: Goal: Will not experience complications related to bowel motility Outcome: Progressing Goal: Will not experience complications related to urinary retention Outcome: Progressing   Problem: Pain Managment: Goal: General experience of comfort will improve and/or be controlled Outcome: Progressing   Problem: Safety: Goal: Ability to remain free from injury will improve Outcome: Progressing   Problem: Skin Integrity: Goal: Risk for impaired skin integrity will decrease Outcome: Progressing   Problem: Health Behavior/Discharge Planning: Goal: Compliance with prescribed medication regimen will improve Outcome: Progressing   Problem: Nutritional: Goal: Ability to achieve adequate nutritional intake will improve Outcome: Progressing

## 2024-06-24 NOTE — Progress Notes (Signed)
   06/24/24 2125  Psych Admission Type (Psych Patients Only)  Admission Status Involuntary  Psychosocial Assessment  Patient Complaints None  Eye Contact Brief  Facial Expression Flat  Affect Flat  Speech Soft  Interaction Minimal;Isolative  Motor Activity Slow  Appearance/Hygiene In scrubs  Behavior Characteristics Cooperative;Guarded  Mood Pleasant  Thought Process  Coherency WDL  Content WDL  Delusions None reported or observed  Perception WDL  Hallucination None reported or observed  Judgment Impaired  Confusion Mild  Danger to Self  Current suicidal ideation? Denies  Self-Injurious Behavior No self-injurious ideation or behavior indicators observed or expressed   Agreement Not to Harm Self Yes  Description of Agreement verbal  Danger to Others  Danger to Others None reported or observed

## 2024-06-24 NOTE — BH IP Treatment Plan (Signed)
 Interdisciplinary Treatment and Diagnostic Plan Update  06/24/2024 Time of Session: 3:00 PM  [name] MRN: [id number]  Principal Diagnosis: <principal problem not specified>  Secondary Diagnoses: Active Problems:   Schizophrenia, paranoid (HCC)   Current Medications:  Current Facility-Administered Medications  Medication Dose Route Frequency Provider Last Rate Last Admin   acetaminophen  (TYLENOL ) tablet 650 mg  650 mg Oral Q6H PRN Starkes-Perry, Takia S, FNP       alum & mag hydroxide-simeth (MAALOX/MYLANTA) 200-200-20 MG/5ML suspension 30 mL  30 mL Oral Q4H PRN Starkes-Perry, Takia S, FNP       HYDROcodone -acetaminophen  (NORCO/VICODIN) 5-325 MG per tablet 1-2 tablet  1-2 tablet Oral Q4H PRN Starkes-Perry, Takia S, FNP       magnesium  hydroxide (MILK OF MAGNESIA) suspension 30 mL  30 mL Oral Daily PRN Starkes-Perry, Takia S, FNP       multivitamin with minerals tablet 1 tablet  1 tablet Oral Daily Wilkie Majel RAMAN, FNP   1 tablet at 06/24/24 9175   OLANZapine  (ZYPREXA ) injection 5 mg  5 mg Intramuscular TID PRN Wilkie Majel RAMAN, FNP       OLANZapine  (ZYPREXA ) tablet 15 mg  15 mg Oral BID Jadapalle, Sree, MD   15 mg at 06/24/24 9175   OLANZapine  zydis (ZYPREXA ) disintegrating tablet 5 mg  5 mg Oral TID PRN Starkes-Perry, Takia S, FNP       ondansetron  (ZOFRAN ) tablet 4 mg  4 mg Oral Q6H PRN Wilkie Majel RAMAN, FNP       Or   ondansetron  (ZOFRAN ) injection 4 mg  4 mg Intravenous Q6H PRN Starkes-Perry, Majel RAMAN, FNP       thiamine  (VITAMIN B1) tablet 100 mg  100 mg Oral Daily Starkes-Perry, Takia S, FNP   100 mg at 06/24/24 9175   traZODone  (DESYREL ) tablet 50 mg  50 mg Oral QHS Starkes-Perry, Takia S, FNP   50 mg at 06/23/24 2126   PTA Medications: Medications Prior to Admission  Medication Sig Dispense Refill Last Dose/Taking   feeding supplement (ENSURE PLUS HIGH PROTEIN) LIQD Take 237 mLs by mouth 3 (three) times daily between meals.      Multiple Vitamin  (MULTIVITAMIN WITH MINERALS) TABS tablet Take 1 tablet by mouth daily.      OLANZapine  (ZYPREXA ) 5 MG tablet Take 1 tablet (5 mg total) by mouth 2 (two) times daily.      OLANZapine  (ZYPREXA ) injection Inject 1 mL (5 mg total) into the muscle 2 (two) times daily.      thiamine  (VITAMIN B-1) 100 MG tablet Take 1 tablet (100 mg total) by mouth daily.      traZODone  (DESYREL ) 50 MG tablet Take 1 tablet (50 mg total) by mouth at bedtime.       Patient Stressors:    Patient Strengths:    Treatment Modalities: Medication Management, Group therapy, Case management,  1 to 1 session with clinician, Psychoeducation, Recreational therapy.   Physician Treatment Plan for Primary Diagnosis: <principal problem not specified> Long Term Goal(s): Improvement in symptoms so as ready for discharge   Short Term Goals: Ability to identify changes in lifestyle to reduce recurrence of condition will improve  Medication Management: Evaluate patient's response, side effects, and tolerance of medication regimen.  Therapeutic Interventions: 1 to 1 sessions, Unit Group sessions and Medication administration.  Evaluation of Outcomes: Progressing  Physician Treatment Plan for Secondary Diagnosis: Active Problems:   Schizophrenia, paranoid (HCC)  Long Term Goal(s): Improvement in symptoms so as ready for discharge  Short Term Goals: Ability to identify changes in lifestyle to reduce recurrence of condition will improve     Medication Management: Evaluate patient's response, side effects, and tolerance of medication regimen.  Therapeutic Interventions: 1 to 1 sessions, Unit Group sessions and Medication administration.  Evaluation of Outcomes: Progressing   RN Treatment Plan for Primary Diagnosis: <principal problem not specified> Long Term Goal(s): Knowledge of disease and therapeutic regimen to maintain health will improve  Short Term Goals: Ability to remain free from injury will improve, Ability to  verbalize frustration and anger appropriately will improve, Ability to demonstrate self-control, Ability to participate in decision making will improve, Ability to verbalize feelings will improve, Ability to disclose and discuss suicidal ideas, Ability to identify and develop effective coping behaviors will improve, and Compliance with prescribed medications will improve  Medication Management: RN will administer medications as ordered by provider, will assess and evaluate patient's response and provide education to patient for prescribed medication. RN will report any adverse and/or side effects to prescribing provider.  Therapeutic Interventions: 1 on 1 counseling sessions, Psychoeducation, Medication administration, Evaluate responses to treatment, Monitor vital signs and CBGs as ordered, Perform/monitor CIWA, COWS, AIMS and Fall Risk screenings as ordered, Perform wound care treatments as ordered.  Evaluation of Outcomes: Progressing   LCSW Treatment Plan for Primary Diagnosis: <principal problem not specified> Long Term Goal(s): Safe transition to appropriate next level of care at discharge, Engage patient in therapeutic group addressing interpersonal concerns.  Short Term Goals: Engage patient in aftercare planning with referrals and resources, Increase social support, Increase ability to appropriately verbalize feelings, Increase emotional regulation, Facilitate acceptance of mental health diagnosis and concerns, Facilitate patient progression through stages of change regarding substance use diagnoses and concerns, Identify triggers associated with mental health/substance abuse issues, and Increase skills for wellness and recovery  Therapeutic Interventions: Assess for all discharge needs, 1 to 1 time with Social worker, Explore available resources and support systems, Assess for adequacy in community support network, Educate family and significant other(s) on suicide prevention, Complete  Psychosocial Assessment, Interpersonal group therapy.  Evaluation of Outcomes: Progressing   Progress in Treatment: Attending groups: No. Participating in groups: No. Taking medication as prescribed: Yes.  Toleration medication: Yes.  Family/Significant other contact made: No, will contact:  legal guardian  Patient understands diagnosis: No.  Discussing patient identified problems/goals with staff: Yes.  Medical problems stabilized or resolved: Yes.  Denies suicidal/homicidal ideation: Yes.  Issues/concerns per patient self-inventory: No.  Other: None   New problem(s) identified: No, Describe:  None identified. Update: 05/13/2024 No changes at this time. Update 05/18/2024:  No changes at this time. Update 05/23/2024:  No changes at this time. Update 05/28/2024:  No changes at this time. Update 06/02/2024:  No changes at this time. Update 06/07/2024:  No changes at this time. 06/12/24 Update: Patient has been isolative to her room with minimal interaction. Update 06/18/24:  No changes at this time  Update 06/24/24:  No changes at this time     New Short Term/Long Term Goal(s): elimination of symptoms of psychosis, medication management for mood stabilization; elimination of SI thoughts; development of comprehensive mental wellness plan. Update: 05/13/2024 No changes at this time.Update: 05/18/2024 No changes at this time. Update 05/23/2024:  No changes at this time. Update 05/28/2024:  No changes at this time. Update 06/02/2024:  No changes at this time. Update 06/07/2024:  No changes at this time. 06/12/24 Update: No changes at this time. Update 06/18/24:  No changes at  this time  Update 06/24/24:  No changes at this time       Patient Goals:  I just need to leave, I don't need it hereUpdate: 05/13/2024 No changes at this time.Update: 05/18/2024 No changes at this time. Update 05/23/2024:  No changes at this time.Update 05/28/2024:  No changes at this time. Update 06/02/2024:  No changes  at this time. Update 06/07/2024:  No changes at this time. 06/12/24 Update: No changes at this time. Update 06/18/24:  No changes at this time Update 06/24/24:  No changes at this time   Discharge Plan or Barriers:  Discharge Plan or Barriers: CSW will assist with appropriate discharge planning. Update: 05/13/2024 No changes at this time.Update: 05/18/2024 No changes at this time. Update 05/23/2024:  No changes at this time.Update 05/28/2024:  CSW unable to identify appropriate decision maker, as pt's legal guardian has passed and CSW has reached out to APS caseworker multiple times to get clarity on protocol for enforcing medication until an appropriate party has been legal identified Update 06/02/2024:  No changes at this time. Update 06/07/2024:  CSW and provider still working to clarify appropriate horticulturist, commercial. Please refer to extensive notes in medical record. 06/12/24 Update: CSW and provider still working with DSS to obtain legal decision maker on patient's behalf. Update 06/18/24:  No changes at this time Update 06/24/24:  DSS received ex-parte, DSS to begin looking for placement, Dr.J to initiate enforced medication   Reason for Continuation of Hospitalization: Aggression Hallucinations Medication stabilization    Estimated Length of Stay: 1 to 7 days. Update: 05/13/2024 TBDUpdate: 05/18/2024 TBD Update 05/23/2024:  TBD Update 05/28/2024:  TBDUpdate 06/02/2024:  TBD Update 06/07/2024:  TBD 06/12/24 Update: TBD. Update 06/18/24:  TBD Update 06/24/24:  TBD  Last 3 [place] Suicide Severity Risk Score: Flowsheet Row Admission (Current) from 05/06/2024 in [facility] ED to Hosp-Admission (Discharged) from 05/02/2024 in [place] [facility] ED from [facility]/30/2024 in [facility]  C-SSRS RISK CATEGORY No Risk No Risk No Risk    Last [facility] 2/9 Scores:     No data to display          Scribe for Treatment Team: [name], [place]RA[place] 06/24/2024 10:59 AM

## 2024-06-25 NOTE — Progress Notes (Signed)
 Phone call back [facility] MD Progress Note  06/25/2024 12:12 PM [name]  MRN:  [id number]  [name] is a 63 y.o. female admitted: Medicallyfor 05/02/2024 10:56 AM for dehydration, failure to thrive and hypoglycemia. She carries the psychiatric diagnoses of Schizophrenia and has medical hx of hypoglycemia and failure to thrive and has a past medical history of sinus bradycardia, chronic anemia, severe malnutrition and recurrent hypoglycemia.. Her current presentation of verbal aggression, agitation and refusal to eat with severe paranoia is most consistent with paranoid schizophrenia. [name]  She was at [facility] where she was admitted on 04/30/2024.  She was transferred to the ED because of failure to thrive and was admitted to medical floor.Patient is on IVC and patient has been adamantly refusing all oral medications and refusing to eat.  Patient is admitted to [facility] unit with Q15 min safety monitoring. Multidisciplinary team approach is offered. Medication management; group/milieu therapy is offered.    Subjective:  Chart reviewed, case discussed in multidisciplinary meeting, patient seen during rounds.    Patient is noted to be walking in her room.  She stands on her feet for hours.  Per nursing she is maintaining her ADLs, coming out of her room to eat her meals and is taking her medications.  Provider informed the patient that there is no information about her guardian and medication adjustments but patient was not ready and got irritable and asked the provider to leave the room.  She denies hallucinations but is clearly noted to be responding to stimuli.  She is not endorsing SI/HI/plan.   Past Psychiatric History: see h&P Family History: History reviewed. No pertinent family history. Social History:  Social History   Substance and Sexual Activity  Alcohol Use Not Currently   Comment: Refuses to disclose how much     Social History   Substance and Sexual Activity  Drug Use No   Comment:  Refuses to answer    Social History   Socioeconomic History   Marital status: Single    Spouse name: Not on file   Number of children: Not on file   Years of education: Not on file   Highest education level: Not on file  Occupational History   Not on file  Tobacco Use   Smoking status: Every Day    Current packs/day: 2.00    Types: Cigarettes    Passive exposure: Current   Smokeless tobacco: Never  Vaping Use   Vaping status: Never Used  Substance and Sexual Activity   Alcohol use: Not Currently    Comment: Refuses to disclose how much   Drug use: No    Comment: Refuses to answer   Sexual activity: Not Currently    Comment: refused to answer  Other Topics Concern   Not on file  Social History Narrative   Not on file   Social Drivers of Health   Financial Resource Strain: Not on file  Food Insecurity: Patient Declined (05/06/2024)   Hunger Vital Sign    Worried About Running Out of Food in the Last Year: Patient declined    Ran Out of Food in the Last Year: Patient declined  Transportation Needs: Patient Declined (05/06/2024)   PRAPARE - Administrator, Civil Service (Medical): Patient declined    Lack of Transportation (Non-Medical): Patient declined  Physical Activity: Not on file  Stress: Not on file  Social Connections: Not on file   Past Medical History:  Past Medical History:  Diagnosis Date  Anemia 10/02/2021   Non compliance w medication regimen    Schizophrenia [facility])     Past Surgical History:  Procedure Laterality Date   BIOPSY  09/25/2021   Procedure: BIOPSY;  Surgeon: Teressa Toribio SQUIBB, MD;  Location: [facility];  Service: Gastroenterology;;   ESOPHAGOGASTRODUODENOSCOPY (EGD) WITH PROPOFOL  [name] 09/25/2021   Procedure: ESOPHAGOGASTRODUODENOSCOPY (EGD) WITH PROPOFOL ;  Surgeon: Teressa Toribio SQUIBB, MD;  Location: [facility];  Service: Gastroenterology;  Laterality: [name];  With NGT placement    Current Medications: Current  Facility-Administered Medications  Medication Dose Route Frequency Provider Last Rate Last Admin   acetaminophen  (TYLENOL ) tablet 650 mg  650 mg Oral Q6H PRN Starkes-Perry, Takia S, FNP       alum & mag hydroxide-simeth (MAALOX/MYLANTA) 200-200-20 MG/5ML suspension 30 mL  30 mL Oral Q4H PRN Starkes-Perry, Takia S, FNP       HYDROcodone -acetaminophen  (NORCO/VICODIN) 5-325 MG per tablet 1-2 tablet  1-2 tablet Oral Q4H PRN Starkes-Perry, Takia S, FNP       magnesium  hydroxide (MILK OF MAGNESIA) suspension 30 mL  30 mL Oral Daily PRN Starkes-Perry, Takia S, FNP       multivitamin with minerals tablet 1 tablet  1 tablet Oral Daily Starkes-Perry, Takia S, FNP   1 tablet at 06/25/24 0845   OLANZapine  (ZYPREXA ) injection 5 mg  5 mg Intramuscular TID PRN Starkes-Perry, Takia S, FNP       OLANZapine  (ZYPREXA ) tablet 15 mg  15 mg Oral BID Amayrany Cafaro, MD   15 mg at 06/25/24 9155   OLANZapine  zydis (ZYPREXA ) disintegrating tablet 5 mg  5 mg Oral TID PRN Starkes-Perry, Takia S, FNP       ondansetron  (ZOFRAN ) tablet 4 mg  4 mg Oral Q6H PRN Starkes-Perry, Majel RAMAN, FNP       Or   ondansetron  (ZOFRAN ) injection 4 mg  4 mg Intravenous Q6H PRN Starkes-Perry, Majel RAMAN, FNP       thiamine  (VITAMIN B1) tablet 100 mg  100 mg Oral Daily Wilkie Majel RAMAN, FNP   100 mg at 06/25/24 0845   traZODone  (DESYREL ) tablet 50 mg  50 mg Oral QHS Starkes-Perry, Takia S, FNP   50 mg at 06/24/24 2104    Lab Results:  No results found for this or any previous visit (from the past 48 hours).   Blood Alcohol level:  Lab Results  Component Value Date   [facility] <15 05/02/2024   ETH <15 05/01/2024    Metabolic Disorder Labs: Lab Results  Component Value Date   HGBA1C 5.6 05/14/2024   MPG 114.02 05/14/2024   MPG 116.89 05/02/2024   Lab Results  Component Value Date   PROLACTIN 13.6 04/16/2015   Lab Results  Component Value Date   CHOL 219 (H) 05/14/2024   TRIG 84 05/14/2024   HDL 83 05/14/2024   CHOLHDL 2.6  05/14/2024   VLDL 17 05/14/2024   LDLCALC 119 (H) 05/14/2024   LDLCALC 130 (H) 05/01/2024    Physical Findings: AIMS:  , ,  ,  ,    CIWA:    COWS:      Psychiatric Specialty Exam:  Presentation  General Appearance:  Casual  Eye Contact: Fair  Speech: Normal rate  Speech Volume: Increased    Mood and Affect  Mood: Angry  Affect: Congruent   Thought Process  Thought Processes: Disorganized  Descriptions of Associations:Tangential  Orientation:Partial  Thought Content:Illogical; Tangential  Hallucinations:RESPONDING TO INTERNAL STIMULI Ideas of Reference:Paranoia; Delusions; Percusatory  Suicidal Thoughts:not endorsing Homicidal  Thoughts:nor endorsing  Sensorium  Memory: Recent Poor  Judgment: Poor  Insight: Poor   Executive Functions  Concentration: Poor  Attention Span: Poor  Recall: Poor  Fund of Knowledge: Poor  Language: Poor   Psychomotor Activity  Psychomotor Activity:No data recorded Musculoskeletal: Strength & Muscle Tone: within normal limits Gait & Station: normal Assets  Assets: Resilience    Physical Exam: Physical Exam Vitals and nursing note reviewed.    ROS Blood pressure 103/61, pulse (!) 55, temperature 98.7 F (37.1 C), resp. rate 17, height 5' 4 (1.626 m), weight 44.9 kg, SpO2 99%. Body mass index is 16.99 kg/m.  Diagnosis: Active Problems:   Schizophrenia, paranoid (HCC) Paranoid  PLAN: Safety and Monitoring:  -- Involuntary admission to inpatient psychiatric unit for safety, stabilization and treatment  -- Daily contact with patient to assess and evaluate symptoms and progress in treatment  -- Patient's case to be discussed in multi-disciplinary team meeting  -- Observation Level : q15 minute checks  -- Vital signs:  q12 hours  -- Precautions: suicide, elopement, and assault -- Encouraged patient to participate in unit milieu and in scheduled group therapies  2. Psychiatric Diagnoses  and Treatment: Patient  2 physicians approved non emergent forced medications-Dr.Madaram and Dr.Luanna Weesner  Zyprexa  15 mg BID oral or Im -non emergent forced medication-patient is taking her oral medication Will add second antipsychotic haldol   to help with the residual psychotic symptoms like Prolixin  once consent is received.-Waiting to hear from DSS caseworker for consent as brother already consented . Discharge Planning:   -- Social work and case management to assist with discharge planning and identification of hospital follow-up needs prior to discharge  -- Estimated LOS: 3-4 days  Octavian Godek, MD 06/25/2024, 12:12 PM

## 2024-06-25 NOTE — Plan of Care (Signed)
  Problem: Activity: Goal: Interest or engagement in activities will improve Outcome: Progressing   Problem: Coping: Goal: Ability to demonstrate self-control will improve Outcome: Progressing   Problem: Nutrition: Goal: Adequate nutrition will be maintained Outcome: Progressing

## 2024-06-25 NOTE — Group Note (Signed)
 Date:  06/25/2024 Time:  9:07 PM  Group Topic/Focus:  Wrap-Up Group:   The focus of this group is to help patients review their daily goal of treatment and discuss progress on daily workbooks.    Participation Level:  Did Not Attend  Participation Quality:     Affect:     Cognitive:     Insight: None  Engagement in Group:  None  Modes of Intervention:     Additional Comments:    [name] [name] 06/25/2024, 9:07 PM

## 2024-06-25 NOTE — Group Note (Signed)
 Date:  06/25/2024 Time:  5:20 PM  Group Topic/Focus:  Self Care:   The focus of this group is to help patients understand the importance of self-care in order to improve or restore emotional, physical, spiritual, interpersonal, and financial health.    Participation Level:  Did Not Attend  [name] 06/25/2024, 5:20 PM

## 2024-06-25 NOTE — Progress Notes (Signed)
 Behavior:   Pleasant.  Cooperative with meds and VS.     Psych assessment: Denies SI.   Interaction / Group attendance:  Present in milieu briefly outside of meals.  Minimal interaction with peers and staff.    Medication/ PRNs: Compliant.  Pain: Denies.  15 min checks in place for safety.

## 2024-06-25 NOTE — Group Note (Signed)
 Date:  06/25/2024 Time:  10:39 AM  Group Topic/Focus:  Goals/Emotion Regulation Group:   The focus of this group is to help patients establish daily goals to achieve during treatment and discuss how the patient can incorporate goal setting into their daily lives to aide in recovery. Pt also learned what emotion regulation was and shared their thoughts and opinion on the topic.   Participation Level:  Did Not Attend   [name] 06/25/2024, 10:39 AM

## 2024-06-25 NOTE — Group Note (Signed)
 LCSW Group Therapy Note  Group Date: 06/25/2024 Start Time: 1045 End Time: 1130   Type of Therapy and Topic:  Group Therapy: Using I Statements  Participation Level:  Did Not Attend  Description of Group:  Patients were asked to provide details of some interpersonal conflicts they have experienced. Patients were then educated about "I" statements, communication which focuses on feelings or views of the speaker rather than what the other person is doing. T group members were asked to reflect on past conflicts and to provide specific examples for utilizing "I" statements.  Therapeutic Goals:  Patients will verbalize understanding of ineffective communication and effective communication. Patients will be able to empathize with whom they are having conflict. Patients will practice effective communication in the form of "I" statements.    Summary of Patient Progress:  Pt did not attend session.   Therapeutic Modalities:   Cognitive Behavioral Therapy Solution-Focused Therapy    Rexene LELON Chesley KEN 06/25/2024  12:33 PM

## 2024-06-26 MED ORDER — HALOPERIDOL 5 MG PO TABS
5.0000 mg | ORAL_TABLET | Freq: Two times a day (BID) | ORAL | Status: AC
  Filled 2024-06-26 (×11): qty 1

## 2024-06-26 NOTE — Group Note (Signed)
 Date:  06/26/2024 Time:  9:33 PM  Group Topic/Focus:  Wrap-Up Group:   The focus of this group is to help patients review their daily goal of treatment and discuss progress on daily workbooks.    Participation Level:  Did Not Attend  Participation Quality:     Affect:     Cognitive:     Insight: None  Engagement in Group:  None  Modes of Intervention:     Additional Comments:    [name] 06/26/2024, 9:33 PM

## 2024-06-26 NOTE — Progress Notes (Signed)
 Phone call back [facility] MD Progress Note  06/26/2024 8:57 PM [name]  MRN:  [id number]  [name] is a 63 y.o. female admitted: Medicallyfor 05/02/2024 10:56 AM for dehydration, failure to thrive and hypoglycemia. She carries the psychiatric diagnoses of Schizophrenia and has medical hx of hypoglycemia and failure to thrive and has a past medical history of sinus bradycardia, chronic anemia, severe malnutrition and recurrent hypoglycemia.. Her current presentation of verbal aggression, agitation and refusal to eat with severe paranoia is most consistent with paranoid schizophrenia. [name]  She was at [facility] where she was admitted on 04/30/2024.  She was transferred to the ED because of failure to thrive and was admitted to medical floor.Patient is on IVC and patient has been adamantly refusing all oral medications and refusing to eat.  Patient is admitted to [facility] unit with Q15 min safety monitoring. Multidisciplinary team approach is offered. Medication management; group/milieu therapy is offered.    Subjective:  Chart reviewed, case discussed in multidisciplinary meeting, patient seen during rounds.   Patient is noted to be sitting in the day area.  She remains verbally minimal and refuses to engage in any conversation regarding her mental health and discharge planning.  She remains delusional and paranoid and refuses to share any information about her family.  Patient denies SI/HI/plan and denies hallucinations.  Past Psychiatric History: see h&P Family History: History reviewed. No pertinent family history. Social History:  Social History   Substance and Sexual Activity  Alcohol Use Not Currently   Comment: Refuses to disclose how much     Social History   Substance and Sexual Activity  Drug Use No   Comment: Refuses to answer    Social History   Socioeconomic History   Marital status: Single    Spouse name: Not on file   Number of children: Not on file   Years of education: Not on  file   Highest education level: Not on file  Occupational History   Not on file  Tobacco Use   Smoking status: Every Day    Current packs/day: 2.00    Types: Cigarettes    Passive exposure: Current   Smokeless tobacco: Never  Vaping Use   Vaping status: Never Used  Substance and Sexual Activity   Alcohol use: Not Currently    Comment: Refuses to disclose how much   Drug use: No    Comment: Refuses to answer   Sexual activity: Not Currently    Comment: refused to answer  Other Topics Concern   Not on file  Social History Narrative   Not on file   Social Drivers of Health   Financial Resource Strain: Not on file  Food Insecurity: Patient Declined (05/06/2024)   Hunger Vital Sign    Worried About Running Out of Food in the Last Year: Patient declined    Ran Out of Food in the Last Year: Patient declined  Transportation Needs: Patient Declined (05/06/2024)   PRAPARE - Administrator, Civil Service (Medical): Patient declined    Lack of Transportation (Non-Medical): Patient declined  Physical Activity: Not on file  Stress: Not on file  Social Connections: Not on file   Past Medical History:  Past Medical History:  Diagnosis Date   Anemia 10/02/2021   Non compliance w medication regimen    Schizophrenia (HCC)     Past Surgical History:  Procedure Laterality Date   BIOPSY  09/25/2021   Procedure: BIOPSY;  Surgeon: Teressa Toribio SQUIBB, MD;  Location: [facility];  Service: Gastroenterology;;   ESOPHAGOGASTRODUODENOSCOPY (EGD) WITH PROPOFOL  [name] 09/25/2021   Procedure: ESOPHAGOGASTRODUODENOSCOPY (EGD) WITH PROPOFOL ;  Surgeon: Teressa Toribio SQUIBB, MD;  Location: [facility];  Service: Gastroenterology;  Laterality: [name];  With NGT placement    Current Medications: Current Facility-Administered Medications  Medication Dose Route Frequency Provider Last Rate Last Admin   acetaminophen  (TYLENOL ) tablet 650 mg  650 mg Oral Q6H PRN Starkes-Perry, Takia S, FNP       alum &  mag hydroxide-simeth (MAALOX/MYLANTA) 200-200-20 MG/5ML suspension 30 mL  30 mL Oral Q4H PRN Starkes-Perry, Takia S, FNP       haloperidol  (HALDOL ) tablet 5 mg  5 mg Oral BID Alexandr Yaworski, MD       HYDROcodone -acetaminophen  (NORCO/VICODIN) 5-325 MG per tablet 1-2 tablet  1-2 tablet Oral Q4H PRN Starkes-Perry, Takia S, FNP       magnesium  hydroxide (MILK OF MAGNESIA) suspension 30 mL  30 mL Oral Daily PRN Starkes-Perry, Takia S, FNP       multivitamin with minerals tablet 1 tablet  1 tablet Oral Daily Wilkie Majel RAMAN, FNP   1 tablet at 06/26/24 1003   OLANZapine  (ZYPREXA ) injection 5 mg  5 mg Intramuscular TID PRN Wilkie Majel RAMAN, FNP       OLANZapine  (ZYPREXA ) tablet 15 mg  15 mg Oral BID Teddrick Mallari, MD   15 mg at 06/26/24 1003   OLANZapine  zydis (ZYPREXA ) disintegrating tablet 5 mg  5 mg Oral TID PRN Wilkie Majel RAMAN, FNP       ondansetron  (ZOFRAN ) tablet 4 mg  4 mg Oral Q6H PRN Starkes-Perry, Majel RAMAN, FNP       Or   ondansetron  (ZOFRAN ) injection 4 mg  4 mg Intravenous Q6H PRN Starkes-Perry, Takia S, FNP       thiamine  (VITAMIN B1) tablet 100 mg  100 mg Oral Daily Starkes-Perry, Takia S, FNP   100 mg at 06/26/24 1003   traZODone  (DESYREL ) tablet 50 mg  50 mg Oral QHS Starkes-Perry, Takia S, FNP   50 mg at 06/25/24 2106    Lab Results:  No results found for this or any previous visit (from the past 48 hours).   Blood Alcohol level:  Lab Results  Component Value Date   [facility] <15 05/02/2024   ETH <15 05/01/2024    Metabolic Disorder Labs: Lab Results  Component Value Date   HGBA1C 5.6 05/14/2024   MPG 114.02 05/14/2024   MPG 116.89 05/02/2024   Lab Results  Component Value Date   PROLACTIN 13.6 04/16/2015   Lab Results  Component Value Date   CHOL 219 (H) 05/14/2024   TRIG 84 05/14/2024   HDL 83 05/14/2024   CHOLHDL 2.6 05/14/2024   VLDL 17 05/14/2024   LDLCALC 119 (H) 05/14/2024   LDLCALC 130 (H) 05/01/2024    Physical Findings: AIMS:  , ,  ,   ,    CIWA:    COWS:      Psychiatric Specialty Exam:  Presentation  General Appearance:  Casual  Eye Contact: Fair  Speech: Normal rate  Speech Volume: Increased    Mood and Affect  Mood: Angry  Affect: Congruent   Thought Process  Thought Processes: Disorganized  Descriptions of Associations:Tangential  Orientation:Partial  Thought Content:Illogical; Tangential  Hallucinations:RESPONDING TO INTERNAL STIMULI Ideas of Reference:Paranoia; Delusions; Percusatory  Suicidal Thoughts:not endorsing Homicidal Thoughts:nor endorsing  Sensorium  Memory: Recent Poor  Judgment: Poor  Insight: Poor   Executive Functions  Concentration: Poor  Attention Span: Poor  Recall: Poor  Fund of Knowledge: Poor  Language: Poor   Psychomotor Activity  Psychomotor Activity:No data recorded Musculoskeletal: Strength & Muscle Tone: within normal limits Gait & Station: normal Assets  Assets: Resilience    Physical Exam: Physical Exam Vitals and nursing note reviewed.    ROS Blood pressure 110/62, pulse (!) 59, temperature 98.2 F (36.8 C), resp. rate 14, height 5' 4 (1.626 m), weight 44.9 kg, SpO2 99%. Body mass index is 16.99 kg/m.  Diagnosis: Active Problems:   Schizophrenia, paranoid (HCC) Paranoid  PLAN: Safety and Monitoring:  -- Involuntary admission to inpatient psychiatric unit for safety, stabilization and treatment  -- Daily contact with patient to assess and evaluate symptoms and progress in treatment  -- Patient's case to be discussed in multi-disciplinary team meeting  -- Observation Level : q15 minute checks  -- Vital signs:  q12 hours  -- Precautions: suicide, elopement, and assault -- Encouraged patient to participate in unit milieu and in scheduled group therapies  2. Psychiatric Diagnoses and Treatment: Patient  2 physicians approved non emergent forced medications-Dr.Madaram and Dr.Irvin Lizama  Zyprexa  15 mg BID oral  or Im -non emergent forced medication-patient is taking her oral medication Haldol  5 mg bid added -DSS gave consent . Discharge Planning:   -- Social work and case management to assist with discharge planning and identification of hospital follow-up needs prior to discharge  -- Estimated LOS: 3-4 days  Allyn Foil, MD 06/26/2024, 8:57 PM

## 2024-06-26 NOTE — Plan of Care (Signed)
   Problem: Education: Goal: Knowledge of [facility] Education information/materials will improve Outcome: Progressing Goal: Emotional status will improve Outcome: Progressing Goal: Mental status will improve Outcome: Progressing Goal: Verbalization of understanding the information provided will improve Outcome: Progressing   Problem: Activity: Goal: Interest or engagement in activities will improve Outcome: Progressing

## 2024-06-26 NOTE — Group Note (Signed)
 Recreation Therapy Group Note   Group Topic:Other  Group Date: 06/26/2024 Start Time: 1415 End Time: 1500 Facilitators: Celestia Jeoffrey BRAVO, LRT, CTRS Location: Dayroom  Activity Description/Intervention: Therapeutic Drumming. Patients with peers and staff were given the opportunity to engage in a leader facilitated [facility] Group Empowerment Drumming Circle with staff from the [employer], in partnership with [employer]. Teaching laboratory technician and trained [employer], Norleen Mon leading with LRT observing and documenting intervention and pt response. This evidenced-based practice targets 7 areas of health and wellbeing in the human experience including: stress-reduction, exercise, self-expression, camaraderie/support, nurturing, spirituality, and music-making (leisure).    Goal Area(s) Addresses:  Patient will engage in pro-social way in music group.  Patient will follow directions of drum leader on the first prompt. Patient will demonstrate no behavioral issues during group.  Patient will identify if a reduction in stress level occurs as a result of participation in therapeutic drum circle.   Affect/Mood: [name]   Participation Level: Did not attend    Clinical Observations/Individualized Feedback: Patient did not attend group.   Plan: Continue to engage patient in RT group sessions 2-3x/week.   Jeoffrey BRAVO Celestia, LRT, CTRS 06/26/2024 4:57 PM

## 2024-06-26 NOTE — Group Note (Signed)
 Date:  06/26/2024 Time:  10:48 AM  Group Topic/Focus:  Goals/ Christmas Bingo  Group:   The focus of this group is to help patients establish daily goals to achieve during treatment and discuss how the patient can incorporate goal setting into their daily lives to aide in recovery. Patients also played 2 rounds of bingo and won cytogeneticist.     Participation Level:  Did Not Attend  [name] 06/26/2024, 10:48 AM

## 2024-06-26 NOTE — Plan of Care (Signed)
  Problem: Education: Goal: Knowledge of [facility] Education information/materials will improve Outcome: Not Met (add Reason) Goal: Emotional status will improve Outcome: Progressing Goal: Mental status will improve Outcome: Not Met (add Reason) Goal: Verbalization of understanding the information provided will improve Outcome: Progressing   Problem: Education: Goal: Emotional status will improve Outcome: Progressing   Problem: Education: Goal: Mental status will improve Outcome: Not Met (add Reason)   Problem: Education: Goal: Verbalization of understanding the information provided will improve Outcome: Progressing

## 2024-06-26 NOTE — Progress Notes (Signed)
   06/26/24 1300  Psych Admission Type (Psych Patients Only)  Admission Status Involuntary  Psychosocial Assessment  Patient Complaints Worrying  Eye Contact Brief  Facial Expression Sullen  Affect Sullen  Speech Logical/coherent  Interaction Minimal  Motor Activity Slow  Appearance/Hygiene In scrubs  Behavior Characteristics Irritable  Mood Sullen  Thought Process  Coherency WDL  Content WDL  Delusions None reported or observed  Perception WDL  Hallucination None reported or observed  Judgment Impaired  Confusion Mild  Danger to Self  Current suicidal ideation? Denies  Agreement Not to Harm Self Yes  Description of Agreement verbal  Danger to Others  Danger to Others None reported or observed

## 2024-06-26 NOTE — Progress Notes (Signed)
   06/25/24 2106  Psych Admission Type (Psych Patients Only)  Admission Status Involuntary  Psychosocial Assessment  Patient Complaints None  Eye Contact Brief  Facial Expression Sullen  Affect Sullen  Speech Logical/coherent  Interaction Minimal  Motor Activity Slow  Appearance/Hygiene In scrubs  Behavior Characteristics Irritable  Mood Sullen  Thought Process  Coherency WDL  Content WDL  Delusions None reported or observed  Perception WDL  Hallucination None reported or observed  Judgment Impaired  Confusion Mild  Danger to Self  Current suicidal ideation? Denies  Self-Injurious Behavior No self-injurious ideation or behavior indicators observed or expressed   Agreement Not to Harm Self Yes  Description of Agreement Verbal  Danger to Others  Danger to Others None reported or observed

## 2024-06-26 NOTE — Group Note (Signed)
 Date:  06/26/2024 Time:  4:38 PM  Group Topic/Focus:  Making Healthy Choices:   The focus of this group is to help patients identify negative/unhealthy choices they were using prior to admission and identify positive/healthier coping strategies to replace them upon discharge.    Participation Level:  Did Not Attend   [name] 06/26/2024, 4:38 PM

## 2024-06-27 NOTE — Group Note (Signed)
 Date:  06/27/2024 Time:  10:54 AM  Group Topic/Focus:  Building Self Esteem:   The Focus of this group is helping patients become aware of the effects of self-esteem on their lives, the things they and others do that enhance or undermine their self-esteem, seeing the relationship between their level of self-esteem and the choices they make and learning ways to enhance self-esteem.    Participation Level:  Did Not Attend   Harlene LITTIE Gavel 06/27/2024, 10:54 AM

## 2024-06-27 NOTE — Group Note (Signed)
 Date:  06/27/2024 Time:  4:21 PM  Group Topic/Focus:  Overcoming Stress:   The focus of this group is to define stress and help patients assess their triggers.    Participation Level:  Did Not Attend   [name] 06/27/2024, 4:21 PM

## 2024-06-27 NOTE — Group Note (Signed)
 Recreation Therapy Group Note   Group Topic:Communication  Group Date: 06/27/2024 Start Time: 1415 End Time: 1500 Facilitators: Celestia Jeoffrey BRAVO, LRT, CTRS Location: Dayroom  Group Description: Name That Food. Pt will randomly select 1 cut paper from laminated stack of popular food images from the 1970's-1990's from LRT. Without showing the group the image they selected, pt will use descriptive words to explain what food they selected. Once the image is correctly guessed, LRT and pts will reminisce and discuss past fond memories associated with the food as well as the importance of communication.  Goal Area(s) Addressed: Patient will increase communication skills.  Patient will increase frustration tolerance skills. Patient will reminisce a fond memory in their life.     Affect/Mood: [name]   Participation Level: Did not attend    Clinical Observations/Individualized Feedback: Patient did not attend group.   Plan: Continue to engage patient in RT group sessions 2-3x/week.   Jeoffrey BRAVO Celestia, LRT, CTRS 06/27/2024 4:21 PM

## 2024-06-27 NOTE — Progress Notes (Signed)
   06/27/24 0100  Psych Admission Type (Psych Patients Only)  Admission Status Involuntary  Psychosocial Assessment  Patient Complaints Other (Comment) (doesn't want a new ie. new order for Haldol )  Eye Contact Brief  Facial Expression Sullen  Affect Sullen  Speech Logical/coherent  Interaction Minimal  Motor Activity Slow  Appearance/Hygiene In scrubs  Behavior Characteristics Pacing  Mood Sullen  Thought Process  Coherency WDL  Content WDL  Delusions None reported or observed  Perception WDL  Hallucination None reported or observed  Judgment Impaired  Confusion Mild  Danger to Self  Current suicidal ideation? Denies  Danger to Others  Danger to Others None reported or observed

## 2024-06-27 NOTE — Plan of Care (Signed)
  Problem: Activity: Goal: Interest or engagement in activities will improve Outcome: Not Progressing   Problem: Health Behavior/Discharge Planning: Goal: Compliance with treatment plan for underlying cause of condition will improve Outcome: Not Progressing   Problem: Safety: Goal: Periods of time without injury will increase Outcome: Progressing   Problem: Safety: Goal: Ability to remain free from injury will improve Outcome: Progressing   Problem: Nutritional: Goal: Ability to achieve adequate nutritional intake will improve Outcome: Progressing

## 2024-06-27 NOTE — Group Note (Signed)
 Date:  06/27/2024 Time:  9:15 PM  Group Topic/Focus:  Wrap-Up Group:   The focus of this group is to help patients review their daily goal of treatment and discuss progress on daily workbooks.    Participation Level:  Did Not Attend  Participation Quality:     Affect:     Cognitive:     Insight: None  Engagement in Group:  None  Modes of Intervention:     Additional Comments:    [name] 06/27/2024, 9:15 PM

## 2024-06-27 NOTE — Progress Notes (Signed)
 Phone call back [facility] MD Progress Note  06/27/2024 9:38 PM [name]  MRN:  [id number]  [name] is a 63 y.o. female admitted: Medicallyfor 05/02/2024 10:56 AM for dehydration, failure to thrive and hypoglycemia. She carries the psychiatric diagnoses of Schizophrenia and has medical hx of hypoglycemia and failure to thrive and has a past medical history of sinus bradycardia, chronic anemia, severe malnutrition and recurrent hypoglycemia.. Her current presentation of verbal aggression, agitation and refusal to eat with severe paranoia is most consistent with paranoid schizophrenia. [name]  She was at [facility] where she was admitted on 04/30/2024.  She was transferred to the ED because of failure to thrive and was admitted to medical floor.Patient is on IVC and patient has been adamantly refusing all oral medications and refusing to eat.  Patient is admitted to [facility] unit with Q15 min safety monitoring. Multidisciplinary team approach is offered. Medication management; group/milieu therapy is offered.    Subjective:  Chart reviewed, case discussed in multidisciplinary meeting, patient seen during rounds.   Patient is noted to be resting in bed.  She offers no complaints.  She got very upset and hostile when provider tried to discuss discharge planning.  Patient remains discharge focused and wants to go home but does not want to provide any information about her home or family stating she has nobody.  Per nursing patient remains hostile and responding to internal stimuli.  Today patient has refused Haldol .  Past Psychiatric History: see h&P Family History: History reviewed. No pertinent family history. Social History:  Social History   Substance and Sexual Activity  Alcohol Use Not Currently   Comment: Refuses to disclose how much     Social History   Substance and Sexual Activity  Drug Use No   Comment: Refuses to answer    Social History   Socioeconomic History   Marital status: Single     Spouse name: Not on file   Number of children: Not on file   Years of education: Not on file   Highest education level: Not on file  Occupational History   Not on file  Tobacco Use   Smoking status: Every Day    Current packs/day: 2.00    Types: Cigarettes    Passive exposure: Current   Smokeless tobacco: Never  Vaping Use   Vaping status: Never Used  Substance and Sexual Activity   Alcohol use: Not Currently    Comment: Refuses to disclose how much   Drug use: No    Comment: Refuses to answer   Sexual activity: Not Currently    Comment: refused to answer  Other Topics Concern   Not on file  Social History Narrative   Not on file   Social Drivers of Health   Financial Resource Strain: Not on file  Food Insecurity: Patient Declined (05/06/2024)   Hunger Vital Sign    Worried About Running Out of Food in the Last Year: Patient declined    Ran Out of Food in the Last Year: Patient declined  Transportation Needs: Patient Declined (05/06/2024)   PRAPARE - Administrator, Civil Service (Medical): Patient declined    Lack of Transportation (Non-Medical): Patient declined  Physical Activity: Not on file  Stress: Not on file  Social Connections: Not on file   Past Medical History:  Past Medical History:  Diagnosis Date   Anemia 10/02/2021   Non compliance w medication regimen    Schizophrenia [facility])     Past Surgical  History:  Procedure Laterality Date   BIOPSY  09/25/2021   Procedure: BIOPSY;  Surgeon: Teressa Toribio SQUIBB, MD;  Location: [facility];  Service: Gastroenterology;;   ESOPHAGOGASTRODUODENOSCOPY (EGD) WITH PROPOFOL  [name] 09/25/2021   Procedure: ESOPHAGOGASTRODUODENOSCOPY (EGD) WITH PROPOFOL ;  Surgeon: Teressa Toribio SQUIBB, MD;  Location: [facility];  Service: Gastroenterology;  Laterality: [name];  With NGT placement    Current Medications: Current Facility-Administered Medications  Medication Dose Route Frequency Provider Last Rate Last Admin    acetaminophen  (TYLENOL ) tablet 650 mg  650 mg Oral Q6H PRN Starkes-Perry, Takia S, FNP       alum & mag hydroxide-simeth (MAALOX/MYLANTA) 200-200-20 MG/5ML suspension 30 mL  30 mL Oral Q4H PRN Starkes-Perry, Takia S, FNP       haloperidol  (HALDOL ) tablet 5 mg  5 mg Oral BID Bruin Bolger, MD       HYDROcodone -acetaminophen  (NORCO/VICODIN) 5-325 MG per tablet 1-2 tablet  1-2 tablet Oral Q4H PRN Starkes-Perry, Takia S, FNP       magnesium  hydroxide (MILK OF MAGNESIA) suspension 30 mL  30 mL Oral Daily PRN Starkes-Perry, Takia S, FNP       multivitamin with minerals tablet 1 tablet  1 tablet Oral Daily Wilkie Majel RAMAN, FNP   1 tablet at 06/27/24 9076   OLANZapine  (ZYPREXA ) injection 5 mg  5 mg Intramuscular TID PRN Wilkie Majel RAMAN, FNP       OLANZapine  (ZYPREXA ) tablet 15 mg  15 mg Oral BID Macy Polio, MD   15 mg at 06/27/24 2053   OLANZapine  zydis (ZYPREXA ) disintegrating tablet 5 mg  5 mg Oral TID PRN Starkes-Perry, Takia S, FNP       ondansetron  (ZOFRAN ) tablet 4 mg  4 mg Oral Q6H PRN Starkes-Perry, Majel RAMAN, FNP       Or   ondansetron  (ZOFRAN ) injection 4 mg  4 mg Intravenous Q6H PRN Starkes-Perry, Majel RAMAN, FNP       thiamine  (VITAMIN B1) tablet 100 mg  100 mg Oral Daily Wilkie Majel RAMAN, FNP   100 mg at 06/27/24 9076   traZODone  (DESYREL ) tablet 50 mg  50 mg Oral QHS Starkes-Perry, Takia S, FNP   50 mg at 06/27/24 2053    Lab Results:  No results found for this or any previous visit (from the past 48 hours).   Blood Alcohol level:  Lab Results  Component Value Date   [facility] <15 05/02/2024   ETH <15 05/01/2024    Metabolic Disorder Labs: Lab Results  Component Value Date   HGBA1C 5.6 05/14/2024   MPG 114.02 05/14/2024   MPG 116.89 05/02/2024   Lab Results  Component Value Date   PROLACTIN 13.6 04/16/2015   Lab Results  Component Value Date   CHOL 219 (H) 05/14/2024   TRIG 84 05/14/2024   HDL 83 05/14/2024   CHOLHDL 2.6 05/14/2024   VLDL 17  05/14/2024   LDLCALC 119 (H) 05/14/2024   LDLCALC 130 (H) 05/01/2024    Physical Findings: AIMS:  , ,  ,  ,    CIWA:    COWS:      Psychiatric Specialty Exam:  Presentation  General Appearance:  Casual  Eye Contact: Fair  Speech: Normal rate  Speech Volume: Increased    Mood and Affect  Mood: Angry  Affect: Congruent   Thought Process  Thought Processes: Disorganized  Descriptions of Associations:Tangential  Orientation:Partial  Thought Content:Illogical; Tangential  Hallucinations:RESPONDING TO INTERNAL STIMULI Ideas of Reference:Paranoia; Delusions; Percusatory  Suicidal Thoughts:not endorsing Homicidal Thoughts:nor  endorsing  Sensorium  Memory: Recent Poor  Judgment: Poor  Insight: Poor   Executive Functions  Concentration: Poor  Attention Span: Poor  Recall: Poor  Fund of Knowledge: Poor  Language: Poor   Psychomotor Activity  Psychomotor Activity:No data recorded Musculoskeletal: Strength & Muscle Tone: within normal limits Gait & Station: normal Assets  Assets: Resilience    Physical Exam: Physical Exam Vitals and nursing note reviewed.    ROS Blood pressure 105/61, pulse 63, temperature 97.7 F (36.5 C), resp. rate 14, height 5' 4 (1.626 m), weight 44.9 kg, SpO2 99%. Body mass index is 16.99 kg/m.  Diagnosis: Active Problems:   Schizophrenia, paranoid (HCC) Paranoid  PLAN: Safety and Monitoring:  -- Involuntary admission to inpatient psychiatric unit for safety, stabilization and treatment  -- Daily contact with patient to assess and evaluate symptoms and progress in treatment  -- Patient's case to be discussed in multi-disciplinary team meeting  -- Observation Level : q15 minute checks  -- Vital signs:  q12 hours  -- Precautions: suicide, elopement, and assault -- Encouraged patient to participate in unit milieu and in scheduled group therapies  2. Psychiatric Diagnoses and Treatment: Patient   2 physicians approved non emergent forced medications-Dr.Madaram and Dr.Antwine Agosto  Zyprexa  15 mg BID oral or Im -non emergent forced medication-patient is taking her oral medication Haldol  5 mg bid added -DSS gave consent . Discharge Planning:   -- Social work and case management to assist with discharge planning and identification of hospital follow-up needs prior to discharge  -- Estimated LOS: 3-4 days  Allyn Foil, MD 06/27/2024, 9:38 PM

## 2024-06-27 NOTE — Group Note (Signed)

## 2024-06-27 NOTE — Plan of Care (Signed)
  Problem: Education: Goal: Mental status will improve Outcome: Progressing   

## 2024-06-28 NOTE — Plan of Care (Signed)
   Problem: Education: Goal: Emotional status will improve Outcome: Progressing Goal: Mental status will improve Outcome: Progressing   Problem: Activity: Goal: Interest or engagement in activities will improve Outcome: Progressing   Problem: Coping: Goal: Ability to verbalize frustrations and anger appropriately will improve Outcome: Progressing

## 2024-06-28 NOTE — Group Note (Signed)
 Date:  06/28/2024 Time:  9:19 PM  Group Topic/Focus:  Self Care:   The focus of this group is to help patients understand the importance of self-care in order to improve or restore emotional, physical, spiritual, interpersonal, and financial health.    Participation Level:  Did Not Attend  Participation Quality:  Did Not Attend  Affect:  Did Not Attend  Cognitive:  Did Not Attend  Insight: None  Engagement in Group:  None  Modes of Intervention:  Did Not Attend  Additional Comments:    [name] 06/28/2024, 9:19 PM

## 2024-06-28 NOTE — Progress Notes (Signed)
°   06/28/24 1000  Psych Admission Type (Psych Patients Only)  Admission Status Involuntary  Psychosocial Assessment  Patient Complaints None  Eye Contact Brief  Facial Expression Flat  Affect Flat  Speech Logical/coherent  Interaction Minimal  Motor Activity Slow  Appearance/Hygiene In scrubs  Behavior Characteristics Pacing  Mood Sullen  Thought Process  Coherency WDL  Content WDL  Delusions None reported or observed  Perception WDL  Hallucination None reported or observed  Judgment Impaired  Confusion Mild  Danger to Self  Current suicidal ideation? Denies  Agreement Not to Harm Self Yes  Description of Agreement verbal  Danger to Others  Danger to Others None reported or observed

## 2024-06-28 NOTE — Progress Notes (Signed)
°   06/27/24 1959  Psych Admission Type (Psych Patients Only)  Admission Status Involuntary  Psychosocial Assessment  Patient Complaints None  Eye Contact Brief  Facial Expression Flat  Affect Flat  Speech Logical/coherent  Interaction Minimal  Motor Activity Slow  Appearance/Hygiene In scrubs  Behavior Characteristics Pacing  Mood Sullen  Thought Process  Coherency WDL  Content WDL  Delusions None reported or observed  Perception WDL  Hallucination None reported or observed  Judgment Impaired  Confusion Mild  Danger to Self  Current suicidal ideation? Denies  Agreement Not to Harm Self Yes  Description of Agreement verbal

## 2024-06-28 NOTE — Progress Notes (Signed)
 Phone call back [facility] MD Progress Note  06/28/2024 3:16 PM [name]  MRN:  [id number]  [name] is a 63 y.o. female admitted: Medicallyfor 05/02/2024 10:56 AM for dehydration, failure to thrive and hypoglycemia. She carries the psychiatric diagnoses of Schizophrenia and has medical hx of hypoglycemia and failure to thrive and has a past medical history of sinus bradycardia, chronic anemia, severe malnutrition and recurrent hypoglycemia.. Her current presentation of verbal aggression, agitation and refusal to eat with severe paranoia is most consistent with paranoid schizophrenia. [name]  She was at [facility] where she was admitted on 04/30/2024.  She was transferred to the ED because of failure to thrive and was admitted to medical floor.Patient is on IVC and patient has been adamantly refusing all oral medications and refusing to eat.  Patient is admitted to [facility] unit with Q15 min safety monitoring. Multidisciplinary team approach is offered. Medication management; group/milieu therapy is offered.    Subjective:  Chart reviewed, case discussed in multidisciplinary meeting, patient seen during rounds.   Patient is noted to be standing in the room.  She continues to decline Haldol  saying she does not need that many medications.  Patient remains hostile and does not allow the provider to explain the medication adjustments and the new update of patient having a legal guardian.  She remains delusional and reports that she is ready to go home but is unable to provide the details of her house.  She is noted to be calm in the milieu but paranoid, mumbling to herself in her room.  Past Psychiatric History: see h&P Family History: History reviewed. No pertinent family history. Social History:  Social History   Substance and Sexual Activity  Alcohol Use Not Currently   Comment: Refuses to disclose how much     Social History   Substance and Sexual Activity  Drug Use No   Comment: Refuses to answer     Social History   Socioeconomic History   Marital status: Single    Spouse name: Not on file   Number of children: Not on file   Years of education: Not on file   Highest education level: Not on file  Occupational History   Not on file  Tobacco Use   Smoking status: Every Day    Current packs/day: 2.00    Types: Cigarettes    Passive exposure: Current   Smokeless tobacco: Never  Vaping Use   Vaping status: Never Used  Substance and Sexual Activity   Alcohol use: Not Currently    Comment: Refuses to disclose how much   Drug use: No    Comment: Refuses to answer   Sexual activity: Not Currently    Comment: refused to answer  Other Topics Concern   Not on file  Social History Narrative   Not on file   Social Drivers of Health   Financial Resource Strain: Not on file  Food Insecurity: Patient Declined (05/06/2024)   Hunger Vital Sign    Worried About Running Out of Food in the Last Year: Patient declined    Ran Out of Food in the Last Year: Patient declined  Transportation Needs: Patient Declined (05/06/2024)   PRAPARE - Administrator, Civil Service (Medical): Patient declined    Lack of Transportation (Non-Medical): Patient declined  Physical Activity: Not on file  Stress: Not on file  Social Connections: Not on file   Past Medical History:  Past Medical History:  Diagnosis Date   Anemia 10/02/2021  Non compliance w medication regimen    Schizophrenia [facility])     Past Surgical History:  Procedure Laterality Date   BIOPSY  09/25/2021   Procedure: BIOPSY;  Surgeon: Teressa Toribio SQUIBB, MD;  Location: [facility];  Service: Gastroenterology;;   ESOPHAGOGASTRODUODENOSCOPY (EGD) WITH PROPOFOL  [name] 09/25/2021   Procedure: ESOPHAGOGASTRODUODENOSCOPY (EGD) WITH PROPOFOL ;  Surgeon: Teressa Toribio SQUIBB, MD;  Location: [facility];  Service: Gastroenterology;  Laterality: [name];  With NGT placement    Current Medications: Current Facility-Administered Medications   Medication Dose Route Frequency Provider Last Rate Last Admin   acetaminophen  (TYLENOL ) tablet 650 mg  650 mg Oral Q6H PRN Starkes-Perry, Takia S, FNP       alum & mag hydroxide-simeth (MAALOX/MYLANTA) 200-200-20 MG/5ML suspension 30 mL  30 mL Oral Q4H PRN Starkes-Perry, Takia S, FNP       haloperidol  (HALDOL ) tablet 5 mg  5 mg Oral BID Tameca Jerez, MD       HYDROcodone -acetaminophen  (NORCO/VICODIN) 5-325 MG per tablet 1-2 tablet  1-2 tablet Oral Q4H PRN Starkes-Perry, Takia S, FNP       magnesium  hydroxide (MILK OF MAGNESIA) suspension 30 mL  30 mL Oral Daily PRN Starkes-Perry, Takia S, FNP       multivitamin with minerals tablet 1 tablet  1 tablet Oral Daily Wilkie Majel RAMAN, FNP   1 tablet at 06/28/24 9089   OLANZapine  (ZYPREXA ) injection 5 mg  5 mg Intramuscular TID PRN Starkes-Perry, Takia S, FNP       OLANZapine  (ZYPREXA ) tablet 15 mg  15 mg Oral BID Termaine Roupp, MD   15 mg at 06/28/24 9088   OLANZapine  zydis (ZYPREXA ) disintegrating tablet 5 mg  5 mg Oral TID PRN Starkes-Perry, Takia S, FNP       ondansetron  (ZOFRAN ) tablet 4 mg  4 mg Oral Q6H PRN Starkes-Perry, Majel RAMAN, FNP       Or   ondansetron  (ZOFRAN ) injection 4 mg  4 mg Intravenous Q6H PRN Starkes-Perry, Majel RAMAN, FNP       thiamine  (VITAMIN B1) tablet 100 mg  100 mg Oral Daily Wilkie Majel RAMAN, FNP   100 mg at 06/28/24 0911   traZODone  (DESYREL ) tablet 50 mg  50 mg Oral QHS Starkes-Perry, Takia S, FNP   50 mg at 06/27/24 2053    Lab Results:  No results found for this or any previous visit (from the past 48 hours).   Blood Alcohol level:  Lab Results  Component Value Date   [facility] <15 05/02/2024   ETH <15 05/01/2024    Metabolic Disorder Labs: Lab Results  Component Value Date   HGBA1C 5.6 05/14/2024   MPG 114.02 05/14/2024   MPG 116.89 05/02/2024   Lab Results  Component Value Date   PROLACTIN 13.6 04/16/2015   Lab Results  Component Value Date   CHOL 219 (H) 05/14/2024   TRIG 84 05/14/2024    HDL 83 05/14/2024   CHOLHDL 2.6 05/14/2024   VLDL 17 05/14/2024   LDLCALC 119 (H) 05/14/2024   LDLCALC 130 (H) 05/01/2024    Physical Findings: AIMS:  , ,  ,  ,    CIWA:    COWS:      Psychiatric Specialty Exam:  Presentation  General Appearance:  Casual  Eye Contact: Fair  Speech: Normal rate  Speech Volume: Increased    Mood and Affect  Mood: Angry  Affect: Congruent   Thought Process  Thought Processes: Disorganized  Descriptions of Associations:Tangential  Orientation:Partial  Thought Content:Illogical; Tangential  Hallucinations:RESPONDING TO INTERNAL STIMULI Ideas of Reference:Paranoia; Delusions; Percusatory  Suicidal Thoughts:not endorsing Homicidal Thoughts:nor endorsing  Sensorium  Memory: Recent Poor  Judgment: Poor  Insight: Poor   Executive Functions  Concentration: Poor  Attention Span: Poor  Recall: Poor  Fund of Knowledge: Poor  Language: Poor   Psychomotor Activity  Psychomotor Activity:No data recorded Musculoskeletal: Strength & Muscle Tone: within normal limits Gait & Station: normal Assets  Assets: Resilience    Physical Exam: Physical Exam Vitals and nursing note reviewed.    ROS Blood pressure (!) 93/52, pulse (!) 57, temperature 98 F (36.7 C), resp. rate 17, height 5' 4 (1.626 m), weight 44.9 kg, SpO2 100%. Body mass index is 16.99 kg/m.  Diagnosis: Active Problems:   Schizophrenia, paranoid (HCC) Paranoid  PLAN: Safety and Monitoring:  -- Involuntary admission to inpatient psychiatric unit for safety, stabilization and treatment  -- Daily contact with patient to assess and evaluate symptoms and progress in treatment  -- Patient's case to be discussed in multi-disciplinary team meeting  -- Observation Level : q15 minute checks  -- Vital signs:  q12 hours  -- Precautions: suicide, elopement, and assault -- Encouraged patient to participate in unit milieu and in scheduled  group therapies  2. Psychiatric Diagnoses and Treatment: Patient  2 physicians approved non emergent forced medications-Dr.Madaram and Dr.Tyquavious Gamel  Zyprexa  15 mg BID oral or Im -non emergent forced medication-patient is taking her oral medication Haldol  5 mg bid added -DSS gave consent . Discharge Planning:   -- Social work and case management to assist with discharge planning and identification of hospital follow-up needs prior to discharge  -- Estimated LOS: 3-4 days  Allyn Foil, MD 06/28/2024, 3:16 PM

## 2024-06-28 NOTE — Group Note (Signed)
 Date:  06/28/2024 Time:  10:23 AM  Group Topic/Focus:  Morning Stretch with Norleen, Movement therapy    Participation Level:  Did Not Attend    [name] Bias 06/28/2024, 10:23 AM

## 2024-06-29 NOTE — Progress Notes (Signed)
 Phone call back [facility] MD Progress Note  06/29/2024 1:25 PM Andre Gallego  MRN:  [id number]  [name] is a 63 y.o. female admitted: Medicallyfor 05/02/2024 10:56 AM for dehydration, failure to thrive and hypoglycemia. She carries the psychiatric diagnoses of Schizophrenia and has medical hx of hypoglycemia and failure to thrive and has a past medical history of sinus bradycardia, chronic anemia, severe malnutrition and recurrent hypoglycemia.. Her current presentation of verbal aggression, agitation and refusal to eat with severe paranoia is most consistent with paranoid schizophrenia. [name]  She was at [facility] where she was admitted on 04/30/2024.  She was transferred to the ED because of failure to thrive and was admitted to medical floor.Patient is on IVC and patient has been adamantly refusing all oral medications and refusing to eat.  Patient is admitted to [facility] unit with Q15 min safety monitoring. Multidisciplinary team approach is offered. Medication management; group/milieu therapy is offered.    Subjective:  Chart reviewed, case discussed in multidisciplinary meeting, patient seen during rounds.   Patient remains superficial and minimally engaging in interview.  She chooses to answer questions only related to physical health and denies having any complaints.  She pressures of the provider and refuses to answer the questions related to SI/HI and hallucinations.  Per nursing and providers personal observation patient is noted to be mumbling and talking to herself in her room.  She continues to refuse to take Haldol  but is taking her Zyprexa .  Reached out to the legal guardian through email to give permission for nonemergent forced medication and awaiting response  Past Psychiatric History: see h&P Family History: History reviewed. No pertinent family history. Social History:  Social History   Substance and Sexual Activity  Alcohol Use Not Currently   Comment: Refuses to disclose how much      Social History   Substance and Sexual Activity  Drug Use No   Comment: Refuses to answer    Social History   Socioeconomic History   Marital status: Single    Spouse name: Not on file   Number of children: Not on file   Years of education: Not on file   Highest education level: Not on file  Occupational History   Not on file  Tobacco Use   Smoking status: Every Day    Current packs/day: 2.00    Types: Cigarettes    Passive exposure: Current   Smokeless tobacco: Never  Vaping Use   Vaping status: Never Used  Substance and Sexual Activity   Alcohol use: Not Currently    Comment: Refuses to disclose how much   Drug use: No    Comment: Refuses to answer   Sexual activity: Not Currently    Comment: refused to answer  Other Topics Concern   Not on file  Social History Narrative   Not on file   Social Drivers of Health   Tobacco Use: High Risk (05/06/2024)   Patient History    Smoking Tobacco Use: Every Day    Smokeless Tobacco Use: Never    Passive Exposure: Current  Financial Resource Strain: Not on file  Food Insecurity: Patient Declined (05/06/2024)   Epic    Worried About Programme Researcher, Broadcasting/film/video in the Last Year: Patient declined    Barista in the Last Year: Patient declined  Transportation Needs: Patient Declined (05/06/2024)   Epic    Lack of Transportation (Medical): Patient declined    Lack of Transportation (Non-Medical): Patient declined  Physical  Activity: Not on file  Stress: Not on file  Social Connections: Not on file  Depression (EYV7-0): Not on file  Alcohol Screen: Low Risk (05/06/2024)   Alcohol Screen    Last Alcohol Screening Score (AUDIT): 0  Housing: Unknown (05/06/2024)   Epic    Unable to Pay for Housing in the Last Year: Patient declined    Number of Times Moved in the Last Year: Not on file    Homeless in the Last Year: Not on file  Utilities: Patient Declined (05/06/2024)   Epic    Threatened with loss of utilities: Patient  declined  Health Literacy: Not on file   Past Medical History:  Past Medical History:  Diagnosis Date   Anemia 10/02/2021   Non compliance w medication regimen    Schizophrenia [facility])     Past Surgical History:  Procedure Laterality Date   BIOPSY  09/25/2021   Procedure: BIOPSY;  Surgeon: Teressa Toribio SQUIBB, MD;  Location: [facility];  Service: Gastroenterology;;   ESOPHAGOGASTRODUODENOSCOPY (EGD) WITH PROPOFOL  [name] 09/25/2021   Procedure: ESOPHAGOGASTRODUODENOSCOPY (EGD) WITH PROPOFOL ;  Surgeon: Teressa Toribio SQUIBB, MD;  Location: [facility];  Service: Gastroenterology;  Laterality: [name];  With NGT placement    Current Medications: Current Facility-Administered Medications  Medication Dose Route Frequency Provider Last Rate Last Admin   acetaminophen  (TYLENOL ) tablet 650 mg  650 mg Oral Q6H PRN Starkes-Perry, Takia S, FNP       alum & mag hydroxide-simeth (MAALOX/MYLANTA) 200-200-20 MG/5ML suspension 30 mL  30 mL Oral Q4H PRN Starkes-Perry, Takia S, FNP       haloperidol  (HALDOL ) tablet 5 mg  5 mg Oral BID Jaydien Panepinto, MD       HYDROcodone -acetaminophen  (NORCO/VICODIN) 5-325 MG per tablet 1-2 tablet  1-2 tablet Oral Q4H PRN Starkes-Perry, Takia S, FNP       magnesium  hydroxide (MILK OF MAGNESIA) suspension 30 mL  30 mL Oral Daily PRN Starkes-Perry, Takia S, FNP       multivitamin with minerals tablet 1 tablet  1 tablet Oral Daily Wilkie Majel RAMAN, FNP   1 tablet at 06/29/24 9141   OLANZapine  (ZYPREXA ) injection 5 mg  5 mg Intramuscular TID PRN Wilkie Majel RAMAN, FNP       OLANZapine  (ZYPREXA ) tablet 15 mg  15 mg Oral BID Nicloe Frontera, MD   15 mg at 06/29/24 9094   OLANZapine  zydis (ZYPREXA ) disintegrating tablet 5 mg  5 mg Oral TID PRN Starkes-Perry, Takia S, FNP       ondansetron  (ZOFRAN ) tablet 4 mg  4 mg Oral Q6H PRN Wilkie Majel RAMAN, FNP       Or   ondansetron  (ZOFRAN ) injection 4 mg  4 mg Intravenous Q6H PRN Starkes-Perry, Majel RAMAN, FNP       thiamine  (VITAMIN B1)  tablet 100 mg  100 mg Oral Daily Starkes-Perry, Takia S, FNP   100 mg at 06/29/24 0858   traZODone  (DESYREL ) tablet 50 mg  50 mg Oral QHS Starkes-Perry, Takia S, FNP   50 mg at 06/28/24 2113    Lab Results:  No results found for this or any previous visit (from the past 48 hours).   Blood Alcohol level:  Lab Results  Component Value Date   [facility] <15 05/02/2024   ETH <15 05/01/2024    Metabolic Disorder Labs: Lab Results  Component Value Date   HGBA1C 5.6 05/14/2024   MPG 114.02 05/14/2024   MPG 116.89 05/02/2024   Lab Results  Component Value Date  PROLACTIN 13.6 04/16/2015   Lab Results  Component Value Date   CHOL 219 (H) 05/14/2024   TRIG 84 05/14/2024   HDL 83 05/14/2024   CHOLHDL 2.6 05/14/2024   VLDL 17 05/14/2024   LDLCALC 119 (H) 05/14/2024   LDLCALC 130 (H) 05/01/2024    Physical Findings: AIMS:  , ,  ,  ,    CIWA:    COWS:      Psychiatric Specialty Exam:  Presentation  General Appearance:  Casual  Eye Contact: Fair  Speech: Normal rate  Speech Volume: Increased    Mood and Affect  Mood: Angry  Affect: Congruent   Thought Process  Thought Processes: Disorganized  Descriptions of Associations:Tangential  Orientation:Partial  Thought Content:Illogical; Tangential  Hallucinations:RESPONDING TO INTERNAL STIMULI Ideas of Reference:Paranoia; Delusions; Percusatory  Suicidal Thoughts:not endorsing Homicidal Thoughts:nor endorsing  Sensorium  Memory: Recent Poor  Judgment: Poor  Insight: Poor   Executive Functions  Concentration: Poor  Attention Span: Poor  Recall: Poor  Fund of Knowledge: Poor  Language: Poor   Psychomotor Activity  Psychomotor Activity:No data recorded Musculoskeletal: Strength & Muscle Tone: within normal limits Gait & Station: normal Assets  Assets: Resilience    Physical Exam: Physical Exam Vitals and nursing note reviewed.    ROS Blood pressure (!) 105/57, pulse (!)  58, temperature 97.9 F (36.6 C), resp. rate 17, height 5' 4 (1.626 m), weight 44.9 kg, SpO2 100%. Body mass index is 16.99 kg/m.  Diagnosis: Active Problems:   Schizophrenia, paranoid (HCC) Paranoid  PLAN: Safety and Monitoring:  -- Involuntary admission to inpatient psychiatric unit for safety, stabilization and treatment  -- Daily contact with patient to assess and evaluate symptoms and progress in treatment  -- Patient's case to be discussed in multi-disciplinary team meeting  -- Observation Level : q15 minute checks  -- Vital signs:  q12 hours  -- Precautions: suicide, elopement, and assault -- Encouraged patient to participate in unit milieu and in scheduled group therapies  2. Psychiatric Diagnoses and Treatment: Patient  2 physicians approved non emergent forced medications-Dr.Madaram and Dr.Emmie Frakes  Zyprexa  15 mg BID oral or Im -non emergent forced medication-patient is taking her oral medication Haldol  5 mg bid added -DSS gave consent . Discharge Planning:   -- Social work and case management to assist with discharge planning and identification of hospital follow-up needs prior to discharge  -- Estimated LOS: 3-4 days  Allyn Foil, MD 06/29/2024, 1:25 PM

## 2024-06-29 NOTE — Group Note (Signed)
 Date:  06/29/2024 Time:  10:53 AM  Group Topic/Focus:  Self Care:   The focus of this group is to help patients understand the importance of self-care in order to improve or restore emotional, physical, spiritual, interpersonal, and financial health. We did chair yoga to stretch our body's and get moving.    Participation Level:  Did Not Attend  [name] 06/29/2024, 10:53 AM

## 2024-06-29 NOTE — Plan of Care (Signed)
  Problem: Activity: Goal: Interest or engagement in activities will improve Outcome: Not Progressing   Problem: Health Behavior/Discharge Planning: Goal: Compliance with treatment plan for underlying cause of condition will improve Outcome: Not Progressing

## 2024-06-29 NOTE — Progress Notes (Signed)
 Behavior:  Cooperative with VS and most medications.    Psych assessment:  Denies SI.  Interaction / Group attendance:  Isolates to room.  No interaction with peers.  Minimal interaction with staff.    Medication/ PRNs:  Refsued Haldol .  Dr. Jadapalle aware.  Pain: Denies.  15 min checks in place for safety.

## 2024-06-29 NOTE — BH IP Treatment Plan (Signed)
 Interdisciplinary Treatment and Diagnostic Plan Update  06/29/2024 Time of Session: 12:30 PM  [name] MRN: [id number]  Principal Diagnosis: <principal problem not specified>  Secondary Diagnoses: Active Problems:   Schizophrenia, paranoid (HCC)   Current Medications:  Current Facility-Administered Medications  Medication Dose Route Frequency Provider Last Rate Last Admin   acetaminophen  (TYLENOL ) tablet 650 mg  650 mg Oral Q6H PRN Starkes-Perry, Takia S, FNP       alum & mag hydroxide-simeth (MAALOX/MYLANTA) 200-200-20 MG/5ML suspension 30 mL  30 mL Oral Q4H PRN Starkes-Perry, Takia S, FNP       haloperidol  (HALDOL ) tablet 5 mg  5 mg Oral BID Jadapalle, Sree, MD       HYDROcodone -acetaminophen  (NORCO/VICODIN) 5-325 MG per tablet 1-2 tablet  1-2 tablet Oral Q4H PRN Starkes-Perry, Takia S, FNP       magnesium  hydroxide (MILK OF MAGNESIA) suspension 30 mL  30 mL Oral Daily PRN Starkes-Perry, Takia S, FNP       multivitamin with minerals tablet 1 tablet  1 tablet Oral Daily Starkes-Perry, Takia S, FNP   1 tablet at 06/29/24 9141   OLANZapine  (ZYPREXA ) injection 5 mg  5 mg Intramuscular TID PRN Wilkie Majel RAMAN, FNP       OLANZapine  (ZYPREXA ) tablet 15 mg  15 mg Oral BID Jadapalle, Sree, MD   15 mg at 06/29/24 9094   OLANZapine  zydis (ZYPREXA ) disintegrating tablet 5 mg  5 mg Oral TID PRN Starkes-Perry, Takia S, FNP       ondansetron  (ZOFRAN ) tablet 4 mg  4 mg Oral Q6H PRN Starkes-Perry, Majel RAMAN, FNP       Or   ondansetron  (ZOFRAN ) injection 4 mg  4 mg Intravenous Q6H PRN Starkes-Perry, Majel RAMAN, FNP       thiamine  (VITAMIN B1) tablet 100 mg  100 mg Oral Daily Starkes-Perry, Takia S, FNP   100 mg at 06/29/24 9141   traZODone  (DESYREL ) tablet 50 mg  50 mg Oral QHS Starkes-Perry, Takia S, FNP   50 mg at 06/28/24 2113   PTA Medications: Medications Prior to Admission  Medication Sig Dispense Refill Last Dose/Taking   feeding supplement (ENSURE PLUS HIGH PROTEIN) LIQD Take 237 mLs by  mouth 3 (three) times daily between meals.      Multiple Vitamin (MULTIVITAMIN WITH MINERALS) TABS tablet Take 1 tablet by mouth daily.      OLANZapine  (ZYPREXA ) 5 MG tablet Take 1 tablet (5 mg total) by mouth 2 (two) times daily.      OLANZapine  (ZYPREXA ) injection Inject 1 mL (5 mg total) into the muscle 2 (two) times daily.      thiamine  (VITAMIN B-1) 100 MG tablet Take 1 tablet (100 mg total) by mouth daily.      traZODone  (DESYREL ) 50 MG tablet Take 1 tablet (50 mg total) by mouth at bedtime.       Patient Stressors:    Patient Strengths:    Treatment Modalities: Medication Management, Group therapy, Case management,  1 to 1 session with clinician, Psychoeducation, Recreational therapy.   Physician Treatment Plan for Primary Diagnosis: <principal problem not specified> Long Term Goal(s): Improvement in symptoms so as ready for discharge   Short Term Goals: Ability to identify changes in lifestyle to reduce recurrence of condition will improve  Medication Management: Evaluate patient's response, side effects, and tolerance of medication regimen.  Therapeutic Interventions: 1 to 1 sessions, Unit Group sessions and Medication administration.  Evaluation of Outcomes: Progressing  Physician Treatment Plan for Secondary Diagnosis: Active  Problems:   Schizophrenia, paranoid (HCC)  Long Term Goal(s): Improvement in symptoms so as ready for discharge   Short Term Goals: Ability to identify changes in lifestyle to reduce recurrence of condition will improve     Medication Management: Evaluate patient's response, side effects, and tolerance of medication regimen.  Therapeutic Interventions: 1 to 1 sessions, Unit Group sessions and Medication administration.  Evaluation of Outcomes: Progressing   RN Treatment Plan for Primary Diagnosis: <principal problem not specified> Long Term Goal(s): Knowledge of disease and therapeutic regimen to maintain health will improve  Short Term  Goals: Ability to remain free from injury will improve, Ability to verbalize frustration and anger appropriately will improve, Ability to demonstrate self-control, Ability to participate in decision making will improve, Ability to verbalize feelings will improve, Ability to disclose and discuss suicidal ideas, Ability to identify and develop effective coping behaviors will improve, and Compliance with prescribed medications will improve  Medication Management: RN will administer medications as ordered by provider, will assess and evaluate patient's response and provide education to patient for prescribed medication. RN will report any adverse and/or side effects to prescribing provider.  Therapeutic Interventions: 1 on 1 counseling sessions, Psychoeducation, Medication administration, Evaluate responses to treatment, Monitor vital signs and CBGs as ordered, Perform/monitor CIWA, COWS, AIMS and Fall Risk screenings as ordered, Perform wound care treatments as ordered.  Evaluation of Outcomes: Progressing   LCSW Treatment Plan for Primary Diagnosis: <principal problem not specified> Long Term Goal(s): Safe transition to appropriate next level of care at discharge, Engage patient in therapeutic group addressing interpersonal concerns.  Short Term Goals: Engage patient in aftercare planning with referrals and resources, Increase social support, Increase ability to appropriately verbalize feelings, Increase emotional regulation, Facilitate acceptance of mental health diagnosis and concerns, Facilitate patient progression through stages of change regarding substance use diagnoses and concerns, Identify triggers associated with mental health/substance abuse issues, and Increase skills for wellness and recovery  Therapeutic Interventions: Assess for all discharge needs, 1 to 1 time with Social worker, Explore available resources and support systems, Assess for adequacy in community support network, Educate  family and significant other(s) on suicide prevention, Complete Psychosocial Assessment, Interpersonal group therapy.  Evaluation of Outcomes: Progressing   Progress in Treatment: Attending groups: No. Participating in groups: No. Taking medication as prescribed: Yes.  Toleration medication: Yes.  Family/Significant other contact made: No, will contact:  legal guardian  Patient understands diagnosis: No.  Discussing patient identified problems/goals with staff: Yes.  Medical problems stabilized or resolved: Yes.  Denies suicidal/homicidal ideation: Yes.  Issues/concerns per patient self-inventory: No.  Other: None   New problem(s) identified: No, Describe:  None identified. Update: 05/13/2024 No changes at this time. Update 05/18/2024:  No changes at this time. Update 05/23/2024:  No changes at this time. Update 05/28/2024:  No changes at this time. Update 06/02/2024:  No changes at this time. Update 06/07/2024:  No changes at this time. 06/12/24 Update: Patient has been isolative to her room with minimal interaction. Update 06/18/24:  No changes at this time  Update 06/24/24:  No changes at this time Update 06/29/24:  No changes at this time     New Short Term/Long Term Goal(s): elimination of symptoms of psychosis, medication management for mood stabilization; elimination of SI thoughts; development of comprehensive mental wellness plan. Update: 05/13/2024 No changes at this time.Update: 05/18/2024 No changes at this time. Update 05/23/2024:  No changes at this time. Update 05/28/2024:  No changes at this time. Update  06/02/2024:  No changes at this time. Update 06/07/2024:  No changes at this time. 06/12/24 Update: No changes at this time. Update 06/18/24:  No changes at this time  Update 06/24/24:  No changes at this time Update 06/29/24:  No changes at this time       Patient Goals:  I just need to leave, I don't need it hereUpdate: 05/13/2024 No changes at this time.Update:  05/18/2024 No changes at this time. Update 05/23/2024:  No changes at this time.Update 05/28/2024:  No changes at this time. Update 06/02/2024:  No changes at this time. Update 06/07/2024:  No changes at this time. 06/12/24 Update: No changes at this time. Update 06/18/24:  No changes at this time Update 06/24/24:  No changes at this time Update 06/29/24:  No changes at this time   Discharge Plan or Barriers:  Discharge Plan or Barriers: CSW will assist with appropriate discharge planning. Update: 05/13/2024 No changes at this time.Update: 05/18/2024 No changes at this time. Update 05/23/2024:  No changes at this time.Update 05/28/2024:  CSW unable to identify appropriate decision maker, as pt's legal guardian has passed and CSW has reached out to APS caseworker multiple times to get clarity on protocol for enforcing medication until an appropriate party has been legal identified Update 06/02/2024:  No changes at this time. Update 06/07/2024:  CSW and provider still working to clarify appropriate horticulturist, commercial. Please refer to extensive notes in medical record. 06/12/24 Update: CSW and provider still working with DSS to obtain legal decision maker on patient's behalf. Update 06/18/24:  No changes at this time Update 06/24/24:  DSS received ex-parte, DSS to begin looking for placement, Dr.J to initiate enforced medication Update 06/29/24:  No changes at this time   Reason for Continuation of Hospitalization: Aggression Hallucinations Medication stabilization    Estimated Length of Stay: 1 to 7 days. Update: 05/13/2024 TBDUpdate: 05/18/2024 TBD Update 05/23/2024:  TBD Update 05/28/2024:  TBDUpdate 06/02/2024:  TBD Update 06/07/2024:  TBD 06/12/24 Update: TBD. Update 06/18/24:  TBD Update 06/24/24:  TBD Update 06/29/24:  TBD  Last 3 [place] Suicide Severity Risk Score: Flowsheet Row Admission (Current) from 05/06/2024 in [facility] to Hosp-Admission (Discharged) from  05/02/2024 in [place] [facility] ED from 04/30/2024 in [facility]  C-SSRS RISK CATEGORY No Risk No Risk No Risk    Last [facility] 2/9 Scores:     No data to display          Scribe for Treatment Team: [name], [place] 06/29/2024 3:40 PM

## 2024-06-29 NOTE — Group Note (Signed)
 Date:  06/29/2024 Time:  3:52 PM  Group Topic/Focus:     Music for gero psych (geriatric psychology/dementia care) focuses on familiar, personalized music from a person's memory bump to reduce agitation, improve mood, spark memories, and enhance communication, with genres like soft classical, jazz, hymns, and old favorites (e.g., Singing in the [place], [place], big band) being effective, alongside tailored music therapy techniques that match tempo and use simple melodies for calming effects.   Participation Level:  Did Not Attend   Harlene LITTIE Gavel 06/29/2024, 3:52 PM

## 2024-06-29 NOTE — Plan of Care (Signed)
   Problem: Education: Goal: Knowledge of [name] General Education information/materials will improve Outcome: Progressing Goal: Emotional status will improve Outcome: Progressing Goal: Mental status will improve Outcome: Progressing Goal: Verbalization of understanding the information provided will improve Outcome: Progressing   Problem: Activity: Goal: Interest or engagement in activities will improve Outcome: Progressing Goal: Sleeping patterns will improve Outcome: Progressing   Problem: Coping: Goal: Ability to verbalize frustrations and anger appropriately will improve Outcome: Progressing Goal: Ability to demonstrate self-control will improve Outcome: Progressing

## 2024-06-29 NOTE — Group Note (Signed)
 LCSW Group Therapy Note  Group Date: 06/29/2024 Start Time: 1330 End Time: 1400   Type of Therapy and Topic:  Group Therapy - Healthy vs Unhealthy Coping Skills  Participation Level:  Did Not Attend   Description of Group The focus of this group was to determine what unhealthy coping techniques typically are used by group members and what healthy coping techniques would be helpful in coping with various problems. Patients were guided in becoming aware of the differences between healthy and unhealthy coping techniques. Patients were asked to identify 2-3 healthy coping skills they would like to learn to use more effectively.  Therapeutic Goals Patients learned that coping is what human beings do all day long to deal with various situations in their lives Patients defined and discussed healthy vs unhealthy coping techniques Patients identified their preferred coping techniques and identified whether these were healthy or unhealthy Patients determined 2-3 healthy coping skills they would like to become more familiar with and use more often. Patients provided support and ideas to each other   Summary of Patient Progress:  X   Therapeutic Modalities Cognitive Behavioral Therapy Motivational Interviewing  [name], [place] 06/29/2024  2:06 PM

## 2024-06-29 NOTE — Group Note (Signed)
 Recreation Therapy Group Note   Group Topic:Leisure Education  Group Date: 06/29/2024 Start Time: 1400 End Time: 1440 Facilitators: Celestia Jeoffrey BRAVO, LRT, CTRS Location: Courtyard  Group Description: Music. Patients are encouraged to name their favorite song(s) for LRT to play song through speaker for group to hear, while in the courtyard getting fresh air and sunlight. Patients educated on the definition of leisure and the importance of having different leisure interests outside of the hospital. Group discussed how leisure activities can often be used as pharmacologist and that listening to music and being outside are examples.    Goal Area(s) Addressed:  Patient will identify a current leisure interest.  Patient will practice making a positive decision. Patient will have the opportunity to try a new leisure activity.   Affect/Mood: [name]   Participation Level: Did not attend    Clinical Observations/Individualized Feedback: Patient did not attend group.   Plan: Continue to engage patient in RT group sessions 2-3x/week.   Jeoffrey BRAVO Celestia, LRT, CTRS 06/29/2024 4:22 PM

## 2024-06-30 NOTE — B[facility] Counselor (Signed)
 Pt was visited by APS caseworker Nat Chang. Nat laid eyes on pt and had a short conversation.   CSW provided updated progress note to [place] per her request.   CSW escorted Nat off the unit without incident.    [name] Croft, MSW, [place] 06/30/2024 4:19 PM

## 2024-06-30 NOTE — Plan of Care (Signed)
  Problem: Coping: Goal: Ability to demonstrate self-control will improve Outcome: Progressing   Problem: Activity: Goal: Interest or engagement in activities will improve Outcome: Not Progressing   Problem: Health Behavior/Discharge Planning: Goal: Compliance with treatment plan for underlying cause of condition will improve Outcome: Not Progressing

## 2024-06-30 NOTE — Plan of Care (Signed)
  Problem: Activity: Goal: Risk for activity intolerance will decrease Outcome: Progressing   Problem: Nutrition: Goal: Adequate nutrition will be maintained Outcome: Progressing   Problem: Coping: Goal: Level of anxiety will decrease Outcome: Progressing   Problem: Pain Managment: Goal: General experience of comfort will improve and/or be controlled Outcome: Progressing   Problem: Safety: Goal: Ability to remain free from injury will improve Outcome: Progressing

## 2024-06-30 NOTE — Group Note (Signed)
 Date:  06/30/2024 Time:  12:27 PM  Group Topic/Focus:  Identifying Needs:   The focus of this group is to help patients identify their personal needs that have been historically problematic and identify healthy behaviors to address their needs.    Participation Level:  Did Not Attend  [name] 06/30/2024, 12:27 PM

## 2024-06-30 NOTE — Progress Notes (Signed)
 Behavior:  Resistant to care. No behavioral issues.   Psych assessment: Denies SI.    Interaction / Group attendance:  Isolates to room.  Does not attend groups. No interaction with peers.  Minimal interaction with staff.  Medication/ PRNs:  Refused Haldol .  Dr. Jadapalle aware.   Pain: Denies.  15 min checks in place for safety.

## 2024-06-30 NOTE — Progress Notes (Signed)
 SI/HI:denies Behavior/Mood: Calm and cooperative. RIS. Denies anxiety and depression.  Interaction/Group: isolative Medications/PRNs: refused haldol . No PRNs given. Pain:denies Other: slept well throughout the night.    06/29/24 2100  Psych Admission Type (Psych Patients Only)  Admission Status Involuntary  Psychosocial Assessment  Patient Complaints None  Eye Contact Poor  Facial Expression Flat  Affect Preoccupied  Speech Soft  Interaction Isolative  Motor Activity Slow  Appearance/Hygiene In scrubs  Behavior Characteristics Irritable  Mood Preoccupied  Thought Process  Coherency WDL  Content Preoccupation  Delusions None reported or observed  Perception WDL  Hallucination None reported or observed  Judgment Impaired  Confusion Mild  Danger to Self  Current suicidal ideation? Denies

## 2024-06-30 NOTE — Group Note (Signed)
 Physical/Occupational Therapy Group Note  Group Topic: UE Therex   Group Date: 06/30/2024 Start Time: 1300 End Time: 1345 Facilitators: Clive Warren CROME, OT   Group Description: Group instructed in series of upper extremities exercises, aimed to promote strength, flexibility, range of motion and functional endurance.  Patients provided cuing for proper mechanics and proper pace of exercise; exercises adjusted as necessary for individualized patient needs.  Patient also engaged in cognitive components throughout session, working to integrate attention to task, command following, turn-taking and appropriate social interaction throughout session.  Allowed to ask questions as appropriate, and encouraged to identify specific exercises that they could complete independently outside of group sessions.  Therapeutic Goal(s):  Demonstrate appropriate performance of upper extremity exercises to promote strength, flexibility, range of motion and functional endurance Identify 2-3 specific upper extremity exercises to complete as home exercise program outside of group session  Individual Participation: Pt did not attend.  Participation Level: Did not attend   Participation Quality:   Behavior:   Speech/Thought Process:   Affect/Mood:   Insight:   Judgement:   Modes of Intervention:   Patient Response to Interventions:    Plan: Continue to engage patient in PT/OT groups 1 - 2x/week.  Kensington Duerst R., MPH, MS, OTR/L ascom [phone] 06/30/2024, 4:07 PM

## 2024-06-30 NOTE — Group Note (Signed)
 Recreation Therapy Group Note   Group Topic:Leisure Education  Group Date: 06/30/2024 Start Time: 1405 End Time: 1500 Facilitators: Celestia Jeoffrey BRAVO, LRT, CTRS Location: Dayroom  Group Description: Bingo. Patients played multiple rounds of bingo. LRT and pts discussed the definition of leisure, things they do in their free time outside of the hospital, and how bingo is also a leisure activity. Pts received a coloring book, word search book, or journal as a prize.    Goal Area(s) Addressed:  Patient will identify a current leisure interest.  Patient will learn the definition of leisure. Patient will have the opportunity to try a new leisure activity. Patient will communicate with peers and LRT.   Affect/Mood: [name]   Participation Level: Did not attend    Clinical Observations/Individualized Feedback: Patient did not attend group.   Plan: Continue to engage patient in RT group sessions 2-3x/week.   Jeoffrey BRAVO Celestia, LRT, CTRS 06/30/2024 4:44 PM

## 2024-06-30 NOTE — Group Note (Signed)
 Date:  06/30/2024 Time:  3:25 PM  Group Topic/Focus:  House rules and expectations for the unit    Participation Level:  Did Not Attend   [name] Bias 06/30/2024, 3:25 PM

## 2024-06-30 NOTE — Progress Notes (Signed)
 Phone call back [facility] MD Progress Note  06/30/2024 2:10 PM [name]  MRN:  [id number]  [name] is a 63 y.o. female admitted: Medicallyfor 05/02/2024 10:56 AM for dehydration, failure to thrive and hypoglycemia. She carries the psychiatric diagnoses of Schizophrenia and has medical hx of hypoglycemia and failure to thrive and has a past medical history of sinus bradycardia, chronic anemia, severe malnutrition and recurrent hypoglycemia.. Her current presentation of verbal aggression, agitation and refusal to eat with severe paranoia is most consistent with paranoid schizophrenia. [name]  She was at [facility] where she was admitted on 04/30/2024.  She was transferred to the ED because of failure to thrive and was admitted to medical floor.Patient is on IVC and patient has been adamantly refusing all oral medications and refusing to eat.  Patient is admitted to [facility] unit with Q15 min safety monitoring. Multidisciplinary team approach is offered. Medication management; group/milieu therapy is offered.    Subjective:  Chart reviewed, case discussed in multidisciplinary meeting, patient seen during rounds.    Patient is noted to be standing in her room.  She remains minimally engaged in interview.  Before the provider entered the room she was noted to be mumbling to herself.  When provider asked about any visitors she got upset and told the provider that she is not her child or family and to get away from her.  She gets very upset on questions related to SI/HI/plan and hallucinations.  Per nursing patient is guarded but is cooperative with medication management.  Per nursing patient took her Haldol  today Past Psychiatric History: see h&P Family History: History reviewed. No pertinent family history. Social History:  Social History   Substance and Sexual Activity  Alcohol Use Not Currently   Comment: Refuses to disclose how much     Social History   Substance and Sexual Activity  Drug Use No    Comment: Refuses to answer    Social History   Socioeconomic History   Marital status: Single    Spouse name: Not on file   Number of children: Not on file   Years of education: Not on file   Highest education level: Not on file  Occupational History   Not on file  Tobacco Use   Smoking status: Every Day    Current packs/day: 2.00    Types: Cigarettes    Passive exposure: Current   Smokeless tobacco: Never  Vaping Use   Vaping status: Never Used  Substance and Sexual Activity   Alcohol use: Not Currently    Comment: Refuses to disclose how much   Drug use: No    Comment: Refuses to answer   Sexual activity: Not Currently    Comment: refused to answer  Other Topics Concern   Not on file  Social History Narrative   Not on file   Social Drivers of Health   Tobacco Use: High Risk (05/06/2024)   Patient History    Smoking Tobacco Use: Every Day    Smokeless Tobacco Use: Never    Passive Exposure: Current  Financial Resource Strain: Not on file  Food Insecurity: Patient Declined (05/06/2024)   Epic    Worried About Programme Researcher, Broadcasting/film/video in the Last Year: Patient declined    Barista in the Last Year: Patient declined  Transportation Needs: Patient Declined (05/06/2024)   Epic    Lack of Transportation (Medical): Patient declined    Lack of Transportation (Non-Medical): Patient declined  Physical Activity: Not  on file  Stress: Not on file  Social Connections: Not on file  Depression (EYV7-0): Not on file  Alcohol Screen: Low Risk (05/06/2024)   Alcohol Screen    Last Alcohol Screening Score (AUDIT): 0  Housing: Unknown (05/06/2024)   Epic    Unable to Pay for Housing in the Last Year: Patient declined    Number of Times Moved in the Last Year: Not on file    Homeless in the Last Year: Not on file  Utilities: Patient Declined (05/06/2024)   Epic    Threatened with loss of utilities: Patient declined  Health Literacy: Not on file   Past Medical History:   Past Medical History:  Diagnosis Date   Anemia 10/02/2021   Non compliance w medication regimen    Schizophrenia [name])     Past Surgical History:  Procedure Laterality Date   BIOPSY  09/25/2021   Procedure: BIOPSY;  Surgeon: Teressa Toribio SQUIBB, MD;  Location: [facility];  Service: Gastroenterology;;   ESOPHAGOGASTRODUODENOSCOPY (EGD) WITH PROPOFOL  [name] 09/25/2021   Procedure: ESOPHAGOGASTRODUODENOSCOPY (EGD) WITH PROPOFOL ;  Surgeon: Teressa Toribio SQUIBB, MD;  Location: [facility];  Service: Gastroenterology;  Laterality: [name];  With NGT placement    Current Medications: Current Facility-Administered Medications  Medication Dose Route Frequency Provider Last Rate Last Admin   acetaminophen  (TYLENOL ) tablet 650 mg  650 mg Oral Q6H PRN Starkes-Perry, Takia S, FNP       alum & mag hydroxide-simeth (MAALOX/MYLANTA) 200-200-20 MG/5ML suspension 30 mL  30 mL Oral Q4H PRN Starkes-Perry, Takia S, FNP       haloperidol  (HALDOL ) tablet 5 mg  5 mg Oral BID Arvetta Araque, MD       HYDROcodone -acetaminophen  (NORCO/VICODIN) 5-325 MG per tablet 1-2 tablet  1-2 tablet Oral Q4H PRN Starkes-Perry, Takia S, FNP       magnesium  hydroxide (MILK OF MAGNESIA) suspension 30 mL  30 mL Oral Daily PRN Starkes-Perry, Takia S, FNP       multivitamin with minerals tablet 1 tablet  1 tablet Oral Daily Wilkie Majel RAMAN, FNP   1 tablet at 06/30/24 9088   OLANZapine  (ZYPREXA ) injection 5 mg  5 mg Intramuscular TID PRN Wilkie Majel RAMAN, FNP       OLANZapine  (ZYPREXA ) tablet 15 mg  15 mg Oral BID Keano Guggenheim, MD   15 mg at 06/30/24 0911   OLANZapine  zydis (ZYPREXA ) disintegrating tablet 5 mg  5 mg Oral TID PRN Starkes-Perry, Takia S, FNP       ondansetron  (ZOFRAN ) tablet 4 mg  4 mg Oral Q6H PRN Starkes-Perry, Majel RAMAN, FNP       Or   ondansetron  (ZOFRAN ) injection 4 mg  4 mg Intravenous Q6H PRN Starkes-Perry, Majel RAMAN, FNP       thiamine  (VITAMIN B1) tablet 100 mg  100 mg Oral Daily Wilkie Majel RAMAN, FNP    100 mg at 06/30/24 0911   traZODone  (DESYREL ) tablet 50 mg  50 mg Oral QHS Starkes-Perry, Takia S, FNP   50 mg at 06/29/24 2129    Lab Results:  No results found for this or any previous visit (from the past 48 hours).   Blood Alcohol level:  Lab Results  Component Value Date   [facility] <15 05/02/2024   ETH <15 05/01/2024    Metabolic Disorder Labs: Lab Results  Component Value Date   HGBA1C 5.6 05/14/2024   MPG 114.02 05/14/2024   MPG 116.89 05/02/2024   Lab Results  Component Value Date   PROLACTIN  13.6 04/16/2015   Lab Results  Component Value Date   CHOL 219 (H) 05/14/2024   TRIG 84 05/14/2024   HDL 83 05/14/2024   CHOLHDL 2.6 05/14/2024   VLDL 17 05/14/2024   LDLCALC 119 (H) 05/14/2024   LDLCALC 130 (H) 05/01/2024    Physical Findings: AIMS:  , ,  ,  ,    CIWA:    COWS:      Psychiatric Specialty Exam:  Presentation  General Appearance:  Casual  Eye Contact: Fair  Speech: Normal rate  Speech Volume: Increased    Mood and Affect  Mood: Angry  Affect: Congruent   Thought Process  Thought Processes: Disorganized  Descriptions of Associations:Tangential  Orientation:Partial  Thought Content:Illogical; Tangential  Hallucinations:RESPONDING TO INTERNAL STIMULI Ideas of Reference:Paranoia; Delusions; Percusatory  Suicidal Thoughts:not endorsing Homicidal Thoughts:nor endorsing  Sensorium  Memory: Recent Poor  Judgment: Poor  Insight: Poor   Executive Functions  Concentration: Poor  Attention Span: Poor  Recall: Poor  Fund of Knowledge: Poor  Language: Poor   Psychomotor Activity  Psychomotor Activity:No data recorded Musculoskeletal: Strength & Muscle Tone: within normal limits Gait & Station: normal Assets  Assets: Resilience    Physical Exam: Physical Exam Vitals and nursing note reviewed.    ROS Blood pressure (!) 103/58, pulse 63, temperature 98.1 F (36.7 C), resp. rate 18, height 5' 4  (1.626 m), weight 44.9 kg, SpO2 100%. Body mass index is 16.99 kg/m.  Diagnosis: Active Problems:   Schizophrenia, paranoid (HCC) Paranoid  PLAN: Safety and Monitoring:  -- Involuntary admission to inpatient psychiatric unit for safety, stabilization and treatment  -- Daily contact with patient to assess and evaluate symptoms and progress in treatment  -- Patient's case to be discussed in multi-disciplinary team meeting  -- Observation Level : q15 minute checks  -- Vital signs:  q12 hours  -- Precautions: suicide, elopement, and assault -- Encouraged patient to participate in unit milieu and in scheduled group therapies  2. Psychiatric Diagnoses and Treatment: Patient  2 physicians approved non emergent forced medications-Dr.Madaram and Dr.Jasmin Winberry  Zyprexa  15 mg BID oral or Im -non emergent forced medication-patient is taking her oral medication Haldol  5 mg bid added -DSS gave consent . Discharge Planning:   -- Social work and case management to assist with discharge planning and identification of hospital follow-up needs prior to discharge  -- Estimated LOS: 3-4 days  Allyn Foil, MD 06/30/2024, 2:10 PM

## 2024-07-01 NOTE — Group Note (Signed)
 Date:  07/01/2024 Time:  8:47 PM  Group Topic/Focus:  Wrap-Up Group:   The focus of this group is to help patients review their daily goal of treatment and discuss progress on daily workbooks.    Participation Level:  Did Not Attend  [name] 07/01/2024, 8:47 PM

## 2024-07-01 NOTE — Plan of Care (Signed)
   Problem: Education: Goal: Knowledge of [facility] Education information/materials will improve Outcome: Progressing Goal: Emotional status will improve Outcome: Progressing Goal: Mental status will improve Outcome: Progressing Goal: Verbalization of understanding the information provided will improve Outcome: Progressing

## 2024-07-01 NOTE — Progress Notes (Signed)
 SI/HI: denies  Behavior/Mood:Alert and oriented. Paranoid. RIS. No behavior issues noted.  Interaction/Group:isolative. Did not participant in group.  Medications/PRNs:Refused Haldol . PRN given for insomnia.  Pain:denies  Other: refused po HS snacks. Slept 9 hours   06/30/24 2200  Psych Admission Type (Psych Patients Only)  Admission Status Involuntary  Psychosocial Assessment  Patient Complaints None  Eye Contact Avoids  Facial Expression Flat  Affect Preoccupied  Speech Soft  Interaction Isolative  Motor Activity Restless  Appearance/Hygiene In scrubs  Behavior Characteristics Resistant to care  Mood Preoccupied  Thought Process  Coherency WDL  Content Preoccupation  Delusions None reported or observed  Perception WDL  Hallucination None reported or observed  Judgment Impaired  Confusion Mild  Danger to Self  Current suicidal ideation? Denies

## 2024-07-01 NOTE — Group Note (Signed)
 [facility] LCSW Group Therapy Note   Group Date: 07/01/2024 Start Time: 1601 End Time: 1631   Type of Therapy and Topic: Group Therapy: Avoiding Self-Sabotaging and Enabling Behaviors  Participation Level: Did Not Attend  Mood:  Description of Group:  In this group, patients will learn how to identify obstacles, self-sabotaging and enabling behaviors, as well as: what are they, why do we do them and what needs these behaviors meet. Discuss unhealthy relationships and how to have positive healthy boundaries with those that sabotage and enable. Explore aspects of self-sabotage and enabling in yourself and how to limit these self-destructive behaviors in everyday life.   Therapeutic Goals: 1. Patient will identify one obstacle that relates to self-sabotage and enabling behaviors 2. Patient will identify one personal self-sabotaging or enabling behavior they did prior to admission 3. Patient will state a plan to change the above identified behavior 4. Patient will demonstrate ability to communicate their needs through discussion and/or role play.    Summary of Patient Progress:   Patient did not participate in group.   Therapeutic Modalities:  Cognitive Behavioral Therapy Person-Centered Therapy Motivational Interviewing    Rexene LELON Mae, LCSWA

## 2024-07-01 NOTE — Progress Notes (Signed)
°   07/01/24 0902  Psych Admission Type (Psych Patients Only)  Admission Status Involuntary  Psychosocial Assessment  Patient Complaints None  Eye Contact Brief  Facial Expression Flat  Affect Preoccupied  Speech Soft;Argumentative  Interaction Isolative  Motor Activity Restless  Appearance/Hygiene In scrubs  Behavior Characteristics Resistant to care;Pacing  Mood Preoccupied  Thought Process  Coherency WDL  Content Preoccupation  Delusions None reported or observed  Perception WDL  Hallucination None reported or observed  Judgment Impaired  Confusion Mild  Danger to Self  Current suicidal ideation? Denies  Danger to Others  Danger to Others None reported or observed

## 2024-07-01 NOTE — Progress Notes (Signed)
 Phone call back [facility] MD Progress Note  07/01/2024 1:08 PM [name]  MRN:  [id number]  [name] is a 63 y.o. female admitted: Medicallyfor 05/02/2024 10:56 AM for dehydration, failure to thrive and hypoglycemia. She carries the psychiatric diagnoses of Schizophrenia and has medical hx of hypoglycemia and failure to thrive and has a past medical history of sinus bradycardia, chronic anemia, severe malnutrition and recurrent hypoglycemia.. Her current presentation of verbal aggression, agitation and refusal to eat with severe paranoia is most consistent with paranoid schizophrenia. [name]  She was at [facility] where she was admitted on 04/30/2024.  She was transferred to the ED because of failure to thrive and was admitted to medical floor.Patient is on IVC and patient has been adamantly refusing all oral medications and refusing to eat.  Patient is admitted to [facility] unit with Q15 min safety monitoring. Multidisciplinary team approach is offered. Medication management; group/milieu therapy is offered.    Subjective:  Chart reviewed, case discussed in multidisciplinary meeting, patient seen during rounds.    Patient is noted to be standing in her room.  She remains minimally engaged in interview.  Before the provider entered the room she was noted to be mumbling to herself.  When provider asked about any visitors she got upset and told the provider that she is not her child or family and to get away from her.  She gets very upset on questions related to SI/HI/plan and hallucinations.  Per nursing patient is guarded but is cooperative with medication management.  Per nursing patient took her Haldol  today  07/01/2024: Patient is reassessed today in the inpatient settting. She is sitting on her bed and continues to isolate mostly in her room. Explains that she is doing the best that she can do and is only taking medication sporadically. Continues to have periods of mumbling to herself. Continues to be guarded,  there has been no behavioral outbursts and she offers minimal engagement for full assessments. Per nursing staff she has not taken haldol  this week. Continues to be easily agitated without behavioral response.    Past Psychiatric History: see h&P Family History: History reviewed. No pertinent family history. Social History:  Social History   Substance and Sexual Activity  Alcohol Use Not Currently   Comment: Refuses to disclose how much     Social History   Substance and Sexual Activity  Drug Use No   Comment: Refuses to answer    Social History   Socioeconomic History   Marital status: Single    Spouse name: Not on file   Number of children: Not on file   Years of education: Not on file   Highest education level: Not on file  Occupational History   Not on file  Tobacco Use   Smoking status: Every Day    Current packs/day: 2.00    Types: Cigarettes    Passive exposure: Current   Smokeless tobacco: Never  Vaping Use   Vaping status: Never Used  Substance and Sexual Activity   Alcohol use: Not Currently    Comment: Refuses to disclose how much   Drug use: No    Comment: Refuses to answer   Sexual activity: Not Currently    Comment: refused to answer  Other Topics Concern   Not on file  Social History Narrative   Not on file   Social Drivers of Health   Tobacco Use: High Risk (05/06/2024)   Patient History    Smoking Tobacco Use: Every Day  Smokeless Tobacco Use: Never    Passive Exposure: Current  Financial Resource Strain: Not on file  Food Insecurity: Patient Declined (05/06/2024)   Epic    Worried About Programme Researcher, Broadcasting/film/video in the Last Year: Patient declined    Barista in the Last Year: Patient declined  Transportation Needs: Patient Declined (05/06/2024)   Epic    Lack of Transportation (Medical): Patient declined    Lack of Transportation (Non-Medical): Patient declined  Physical Activity: Not on file  Stress: Not on file  Social  Connections: Not on file  Depression (PHQ2-9): Not on file  Alcohol Screen: Low Risk (05/06/2024)   Alcohol Screen    Last Alcohol Screening Score (AUDIT): 0  Housing: Unknown (05/06/2024)   Epic    Unable to Pay for Housing in the Last Year: Patient declined    Number of Times Moved in the Last Year: Not on file    Homeless in the Last Year: Not on file  Utilities: Patient Declined (05/06/2024)   Epic    Threatened with loss of utilities: Patient declined  Health Literacy: Not on file   Past Medical History:  Past Medical History:  Diagnosis Date   Anemia 10/02/2021   Non compliance w medication regimen    Schizophrenia [facility])     Past Surgical History:  Procedure Laterality Date   BIOPSY  09/25/2021   Procedure: BIOPSY;  Surgeon: Teressa Toribio SQUIBB, MD;  Location: [facility];  Service: Gastroenterology;;   ESOPHAGOGASTRODUODENOSCOPY (EGD) WITH PROPOFOL  [name] 09/25/2021   Procedure: ESOPHAGOGASTRODUODENOSCOPY (EGD) WITH PROPOFOL ;  Surgeon: Teressa Toribio SQUIBB, MD;  Location: [facility];  Service: Gastroenterology;  Laterality: [name];  With NGT placement    Current Medications: Current Facility-Administered Medications  Medication Dose Route Frequency Provider Last Rate Last Admin   acetaminophen  (TYLENOL ) tablet 650 mg  650 mg Oral Q6H PRN Starkes-Perry, Takia S, FNP       alum & mag hydroxide-simeth (MAALOX/MYLANTA) 200-200-20 MG/5ML suspension 30 mL  30 mL Oral Q4H PRN Starkes-Perry, Takia S, FNP       haloperidol  (HALDOL ) tablet 5 mg  5 mg Oral BID Jadapalle, Sree, MD       HYDROcodone -acetaminophen  (NORCO/VICODIN) 5-325 MG per tablet 1-2 tablet  1-2 tablet Oral Q4H PRN Starkes-Perry, Takia S, FNP       magnesium  hydroxide (MILK OF MAGNESIA) suspension 30 mL  30 mL Oral Daily PRN Starkes-Perry, Takia S, FNP       multivitamin with minerals tablet 1 tablet  1 tablet Oral Daily Wilkie Majel RAMAN, FNP   1 tablet at 07/01/24 9097   OLANZapine  (ZYPREXA ) injection 5 mg  5 mg  Intramuscular TID PRN Wilkie Majel RAMAN, FNP       OLANZapine  (ZYPREXA ) tablet 15 mg  15 mg Oral BID Jadapalle, Sree, MD   15 mg at 07/01/24 0901   OLANZapine  zydis (ZYPREXA ) disintegrating tablet 5 mg  5 mg Oral TID PRN Starkes-Perry, Takia S, FNP       ondansetron  (ZOFRAN ) tablet 4 mg  4 mg Oral Q6H PRN Starkes-Perry, Majel RAMAN, FNP       Or   ondansetron  (ZOFRAN ) injection 4 mg  4 mg Intravenous Q6H PRN Starkes-Perry, Majel RAMAN, FNP       thiamine  (VITAMIN B1) tablet 100 mg  100 mg Oral Daily Wilkie Majel RAMAN, FNP   100 mg at 07/01/24 0901   traZODone  (DESYREL ) tablet 50 mg  50 mg Oral QHS Wilkie Majel RAMAN, FNP  50 mg at 06/30/24 2150    Lab Results:  No results found for this or any previous visit (from the past 48 hours).   Blood Alcohol level:  Lab Results  Component Value Date   [facility] <15 05/02/2024   ETH <15 05/01/2024    Metabolic Disorder Labs: Lab Results  Component Value Date   HGBA1C 5.6 05/14/2024   MPG 114.02 05/14/2024   MPG 116.89 05/02/2024   Lab Results  Component Value Date   PROLACTIN 13.6 04/16/2015   Lab Results  Component Value Date   CHOL 219 (H) 05/14/2024   TRIG 84 05/14/2024   HDL 83 05/14/2024   CHOLHDL 2.6 05/14/2024   VLDL 17 05/14/2024   LDLCALC 119 (H) 05/14/2024   LDLCALC 130 (H) 05/01/2024    Physical Findings: AIMS:  , ,  ,  ,    CIWA:    COWS:      Psychiatric Specialty Exam:  Presentation  General Appearance:  Casual  Eye Contact: Fair  Speech: Normal rate  Speech Volume: Increased    Mood and Affect  Mood: Angry  Affect: Congruent   Thought Process  Thought Processes: Disorganized  Descriptions of Associations:Tangential  Orientation:Partial  Thought Content:Illogical; Tangential  Hallucinations:RESPONDING TO INTERNAL STIMULI Ideas of Reference:Paranoia; Delusions; Percusatory  Suicidal Thoughts:not endorsing Homicidal Thoughts:nor endorsing  Sensorium  Memory: Recent  Poor  Judgment: Poor  Insight: Poor   Executive Functions  Concentration: Poor  Attention Span: Poor  Recall: Poor  Fund of Knowledge: Poor  Language: Poor   Psychomotor Activity  Psychomotor Activity:No data recorded Musculoskeletal: Strength & Muscle Tone: within normal limits Gait & Station: normal Assets  Assets: Resilience    Physical Exam: Physical Exam Vitals and nursing note reviewed.    ROS Blood pressure 94/82, pulse 61, temperature 97.7 F (36.5 C), resp. rate 16, height 5' 4 (1.626 m), weight 44.9 kg, SpO2 100%. Body mass index is 16.99 kg/m.  Diagnosis: Active Problems:   Schizophrenia, paranoid (HCC) Paranoid  PLAN: Safety and Monitoring:  -- Involuntary admission to inpatient psychiatric unit for safety, stabilization and treatment  -- Daily contact with patient to assess and evaluate symptoms and progress in treatment  -- Patient's case to be discussed in multi-disciplinary team meeting  -- Observation Level : q15 minute checks  -- Vital signs:  q12 hours  -- Precautions: suicide, elopement, and assault -- Encouraged patient to participate in unit milieu and in scheduled group therapies  2. Psychiatric Diagnoses and Treatment: Patient  2 physicians approved non emergent forced medications-Dr.Madaram and Dr.Jadapalle  Zyprexa  15 mg BID oral or Im -non emergent forced medication-patient is taking her oral medication Haldol  5 mg bid added -DSS gave consent . Discharge Planning:   -- Social work and case management to assist with discharge planning and identification of hospital follow-up needs prior to discharge  -- Estimated LOS: 3-4 days  Daine KATHEE Ober, NP 07/01/2024, 1:08 PM

## 2024-07-01 NOTE — Group Note (Signed)
 Date:  07/01/2024 Time:  4:08 PM  Group Topic/Focus:  Healthy Communication:   The focus of this group is to discuss communication, barriers to communication, as well as healthy ways to communicate with others.    Participation Level:  Did Not Attend   Harlene LITTIE Gavel 07/01/2024, 4:08 PM

## 2024-07-01 NOTE — Group Note (Signed)
 Date:  07/01/2024 Time:  10:12 AM  Group Topic/Focus:  Movement Therapy    Participation Level:  Did Not Attend    [name] Bias 07/01/2024, 10:12 AM

## 2024-07-01 NOTE — Plan of Care (Signed)
  Problem: Activity: Goal: Interest or engagement in activities will improve Outcome: Progressing Goal: Sleeping patterns will improve Outcome: Progressing   Problem: Coping: Goal: Ability to verbalize frustrations and anger appropriately will improve Outcome: Progressing Goal: Ability to demonstrate self-control will improve Outcome: Progressing   Problem: Safety: Goal: Periods of time without injury will increase Outcome: Progressing

## 2024-07-02 NOTE — Plan of Care (Signed)
   Problem: Activity: Goal: Risk for activity intolerance will decrease Outcome: Progressing   Problem: Nutrition: Goal: Adequate nutrition will be maintained Outcome: Progressing   Problem: Coping: Goal: Level of anxiety will decrease Outcome: Progressing

## 2024-07-02 NOTE — Progress Notes (Signed)
 Phone call back [facility] MD Progress Note  07/02/2024 12:15 PM [name]:  [id number]  [name] is a 63 y.o. female admitted: Medicallyfor 05/02/2024 10:56 AM for dehydration, failure to thrive and hypoglycemia. She carries the psychiatric diagnoses of Schizophrenia and has medical hx of hypoglycemia and failure to thrive and has a past medical history of sinus bradycardia, chronic anemia, severe malnutrition and recurrent hypoglycemia.. Her current presentation of verbal aggression, agitation and refusal to eat with severe paranoia is most consistent with paranoid schizophrenia. [name]  She was at [facility] where she was admitted on 04/30/2024.  She was transferred to the ED because of failure to thrive and was admitted to medical floor.Patient is on IVC and patient has been adamantly refusing all oral medications and refusing to eat.  Patient is admitted to [facility] unit with Q15 min safety monitoring. Multidisciplinary team approach is offered. Medication management; group/milieu therapy is offered.    Subjective:  Chart reviewed, case discussed in multidisciplinary meeting, patient seen during rounds.    Patient is reassessed today in the inpatient settting. She is sitting on her bed and continues to isolate mostly in her room. Explains that she is doing the best that she can do and is only taking medication sporadically. Continues to have periods of mumbling to herself. Continues to be guarded, there has been no behavioral outbursts and she offers minimal engagement for full assessments. Per nursing staff she has not taken haldol  this week.  Does not want to take medications Past Psychiatric History: see h&P Family History: History reviewed. No pertinent family history. Social History:  Social History   Substance and Sexual Activity  Alcohol Use Not Currently   Comment: Refuses to disclose how much     Social History   Substance and Sexual Activity  Drug Use No   Comment: Refuses to answer     Social History   Socioeconomic History   Marital status: Single    Spouse name: Not on file   Number of children: Not on file   Years of education: Not on file   Highest education level: Not on file  Occupational History   Not on file  Tobacco Use   Smoking status: Every Day    Current packs/day: 2.00    Types: Cigarettes    Passive exposure: Current   Smokeless tobacco: Never  Vaping Use   Vaping status: Never Used  Substance and Sexual Activity   Alcohol use: Not Currently    Comment: Refuses to disclose how much   Drug use: No    Comment: Refuses to answer   Sexual activity: Not Currently    Comment: refused to answer  Other Topics Concern   Not on file  Social History Narrative   Not on file   Social Drivers of Health   Tobacco Use: High Risk (05/06/2024)   Patient History    Smoking Tobacco Use: Every Day    Smokeless Tobacco Use: Never    Passive Exposure: Current  Financial Resource Strain: Not on file  Food Insecurity: Patient Declined (05/06/2024)   Epic    Worried About Programme Researcher, Broadcasting/film/video in the Last Year: Patient declined    Barista in the Last Year: Patient declined  Transportation Needs: Patient Declined (05/06/2024)   Epic    Lack of Transportation (Medical): Patient declined    Lack of Transportation (Non-Medical): Patient declined  Physical Activity: Not on file  Stress: Not on file  Social  Connections: Not on file  Depression (EYV7-0): Not on file  Alcohol Screen: Low Risk (05/06/2024)   Alcohol Screen    Last Alcohol Screening Score (AUDIT): 0  Housing: Unknown (05/06/2024)   Epic    Unable to Pay for Housing in the Last Year: Patient declined    Number of Times Moved in the Last Year: Not on file    Homeless in the Last Year: Not on file  Utilities: Patient Declined (05/06/2024)   Epic    Threatened with loss of utilities: Patient declined  Health Literacy: Not on file   Past Medical History:  Past Medical History:   Diagnosis Date   Anemia 10/02/2021   Non compliance w medication regimen    Schizophrenia [facility])     Past Surgical History:  Procedure Laterality Date   BIOPSY  09/25/2021   Procedure: BIOPSY;  Surgeon: Teressa Toribio SQUIBB, MD;  Location: [facility];  Service: Gastroenterology;;   ESOPHAGOGASTRODUODENOSCOPY (EGD) WITH PROPOFOL  [name] 09/25/2021   Procedure: ESOPHAGOGASTRODUODENOSCOPY (EGD) WITH PROPOFOL ;  Surgeon: Teressa Toribio SQUIBB, MD;  Location: [facility];  Service: Gastroenterology;  Laterality: [name];  With NGT placement    Current Medications: Current Facility-Administered Medications  Medication Dose Route Frequency Provider Last Rate Last Admin   acetaminophen  (TYLENOL ) tablet 650 mg  650 mg Oral Q6H PRN Starkes-Perry, Takia S, FNP       alum & mag hydroxide-simeth (MAALOX/MYLANTA) 200-200-20 MG/5ML suspension 30 mL  30 mL Oral Q4H PRN Starkes-Perry, Takia S, FNP       haloperidol  (HALDOL ) tablet 5 mg  5 mg Oral BID Jadapalle, Sree, MD       HYDROcodone -acetaminophen  (NORCO/VICODIN) 5-325 MG per tablet 1-2 tablet  1-2 tablet Oral Q4H PRN Starkes-Perry, Takia S, FNP       magnesium  hydroxide (MILK OF MAGNESIA) suspension 30 mL  30 mL Oral Daily PRN Starkes-Perry, Takia S, FNP       multivitamin with minerals tablet 1 tablet  1 tablet Oral Daily Wilkie Majel RAMAN, FNP   1 tablet at 07/02/24 0830   OLANZapine  (ZYPREXA ) injection 5 mg  5 mg Intramuscular TID PRN Wilkie Majel RAMAN, FNP       OLANZapine  (ZYPREXA ) tablet 15 mg  15 mg Oral BID Jadapalle, Sree, MD   15 mg at 07/02/24 9165   OLANZapine  zydis (ZYPREXA ) disintegrating tablet 5 mg  5 mg Oral TID PRN Starkes-Perry, Takia S, FNP       ondansetron  (ZOFRAN ) tablet 4 mg  4 mg Oral Q6H PRN Starkes-Perry, Majel RAMAN, FNP       Or   ondansetron  (ZOFRAN ) injection 4 mg  4 mg Intravenous Q6H PRN Starkes-Perry, Majel RAMAN, FNP       thiamine  (VITAMIN B1) tablet 100 mg  100 mg Oral Daily Starkes-Perry, Takia S, FNP   100 mg at 07/02/24 0830    traZODone  (DESYREL ) tablet 50 mg  50 mg Oral QHS Starkes-Perry, Takia S, FNP   50 mg at 07/01/24 2143    Lab Results:  No results found for this or any previous visit (from the past 48 hours).   Blood Alcohol level:  Lab Results  Component Value Date   [facility] <15 05/02/2024   ETH <15 05/01/2024    Metabolic Disorder Labs: Lab Results  Component Value Date   HGBA1C 5.6 05/14/2024   MPG 114.02 05/14/2024   MPG 116.89 05/02/2024   Lab Results  Component Value Date   PROLACTIN 13.6 04/16/2015   Lab Results  Component Value  Date   CHOL 219 (H) 05/14/2024   TRIG 84 05/14/2024   HDL 83 05/14/2024   CHOLHDL 2.6 05/14/2024   VLDL 17 05/14/2024   LDLCALC 119 (H) 05/14/2024   LDLCALC 130 (H) 05/01/2024    Physical Findings: AIMS:  , ,  ,  ,    CIWA:    COWS:      Psychiatric Specialty Exam:  Presentation  General Appearance:  Casual  Eye Contact: Fair  Speech: Normal rate  Speech Volume: Increased    Mood and Affect  Mood: Angry  Affect: Congruent   Thought Process  Thought Processes: Disorganized  Descriptions of Associations:Tangential  Orientation:Partial  Thought Content:Illogical; Tangential  Hallucinations:RESPONDING TO INTERNAL STIMULI Ideas of Reference:Paranoia; Delusions; Percusatory  Suicidal Thoughts:not endorsing Homicidal Thoughts:nor endorsing  Sensorium  Memory: Recent Poor  Judgment: Poor  Insight: Poor   Executive Functions  Concentration: Poor  Attention Span: Poor  Recall: Poor  Fund of Knowledge: Poor  Language: Poor   Psychomotor Activity  Psychomotor Activity:No data recorded Musculoskeletal: Strength & Muscle Tone: within normal limits Gait & Station: normal Assets  Assets: Resilience    Physical Exam: Physical Exam Vitals and nursing note reviewed.    ROS Blood pressure 107/68, pulse 61, temperature 98 F (36.7 C), resp. rate 16, height 5' 4 (1.626 m), weight 44.9 kg, SpO2  100%. Body mass index is 16.99 kg/m.  Diagnosis: Active Problems:   Schizophrenia, paranoid (HCC) Paranoid  PLAN: Safety and Monitoring:  -- Involuntary admission to inpatient psychiatric unit for safety, stabilization and treatment  -- Daily contact with patient to assess and evaluate symptoms and progress in treatment  -- Patient's case to be discussed in multi-disciplinary team meeting  -- Observation Level : q15 minute checks  -- Vital signs:  q12 hours  -- Precautions: suicide, elopement, and assault -- Encouraged patient to participate in unit milieu and in scheduled group therapies  2. Psychiatric Diagnoses and Treatment: Patient  2 physicians approved non emergent forced medications-Dr.Zora Glendenning and Dr.Jadapalle  Zyprexa  15 mg BID oral or Im -non emergent forced medication-patient is taking her oral medication Haldol  5 mg bid added -DSS gave consent . Discharge Planning:   -- Social work and case management to assist with discharge planning and identification of hospital follow-up needs prior to discharge  -- Estimated LOS: 3-4 days  Millie JONELLE Manners, MD 07/02/2024, 12:15 PM

## 2024-07-02 NOTE — Group Note (Signed)
 Date:  07/02/2024 Time:  9:47 PM  Group Topic/Focus:  Wrap-Up Group:   The focus of this group is to help patients review their daily goal of treatment and discuss progress on daily workbooks.    Participation Level:  Did Not Attend  Participation Quality:     Affect:     Cognitive:     Insight: None  Engagement in Group:  None  Modes of Intervention:     Additional Comments:    [name] [name] 07/02/2024, 9:47 PM

## 2024-07-02 NOTE — Plan of Care (Signed)
  Problem: Education: Goal: Knowledge of [place] [facility] Education information/materials will improve Outcome: Progressing Goal: Emotional status will improve Outcome: Progressing Goal: Mental status will improve Outcome: Progressing Goal: Verbalization of understanding the information provided will improve Outcome: Progressing   Problem: Activity: Goal: Interest or engagement in activities will improve Outcome: Progressing Goal: Sleeping patterns will improve Outcome: Progressing   Problem: Coping: Goal: Ability to verbalize frustrations and anger appropriately will improve Outcome: Progressing Goal: Ability to demonstrate self-control will improve Outcome: Progressing   Problem: Health Behavior/Discharge Planning: Goal: Identification of resources available to assist in meeting health care needs will improve Outcome: Progressing Goal: Compliance with treatment plan for underlying cause of condition will improve Outcome: Progressing   Problem: Physical Regulation: Goal: Ability to maintain clinical measurements within normal limits will improve Outcome: Progressing   Problem: Safety: Goal: Periods of time without injury will increase Outcome: Progressing   Problem: Education: Goal: Knowledge of General Education information will improve Description: Including pain rating scale, medication(s)/side effects and non-pharmacologic comfort measures Outcome: Progressing   Problem: Health Behavior/Discharge Planning: Goal: Ability to manage health-related needs will improve Outcome: Progressing   Problem: Clinical Measurements: Goal: Ability to maintain clinical measurements within normal limits will improve Outcome: Progressing Goal: Will remain free from infection Outcome: Progressing Goal: Diagnostic test results will improve Outcome: Progressing Goal: Respiratory complications will improve Outcome: Progressing Goal: Cardiovascular complication will be  avoided Outcome: Progressing   Problem: Activity: Goal: Risk for activity intolerance will decrease Outcome: Progressing   Problem: Nutrition: Goal: Adequate nutrition will be maintained Outcome: Progressing   Problem: Coping: Goal: Level of anxiety will decrease Outcome: Progressing   Problem: Elimination: Goal: Will not experience complications related to bowel motility Outcome: Progressing Goal: Will not experience complications related to urinary retention Outcome: Progressing   Problem: Pain Managment: Goal: General experience of comfort will improve and/or be controlled Outcome: Progressing   Problem: Safety: Goal: Ability to remain free from injury will improve Outcome: Progressing   Problem: Skin Integrity: Goal: Risk for impaired skin integrity will decrease Outcome: Progressing   Problem: Health Behavior/Discharge Planning: Goal: Compliance with prescribed medication regimen will improve Outcome: Progressing   Problem: Nutritional: Goal: Ability to achieve adequate nutritional intake will improve Outcome: Progressing

## 2024-07-02 NOTE — Group Note (Signed)
 Date:  07/02/2024 Time:  10:18 AM  Group Topic/Focus:  Movement Therapy, Morning Stretch with Jacquis Paxton.    Participation Level:  Did Not Attend    [name] 07/02/2024, 10:18 AM

## 2024-07-02 NOTE — Group Note (Signed)
 LCSW Group Therapy Note  Group Date: 07/02/2024 Start Time: 1300 End Time: 1345   Type of Therapy and Topic:  Group Therapy - Healthy vs Unhealthy Coping Skills  Participation Level:  Did Not Attend   Description of Group The focus of this group was to determine what unhealthy coping techniques typically are used by group members and what healthy coping techniques would be helpful in coping with various problems. Patients were guided in becoming aware of the differences between healthy and unhealthy coping techniques. Patients were asked to identify 2-3 healthy coping skills they would like to learn to use more effectively.  Therapeutic Goals Patients learned that coping is what human beings do all day long to deal with various situations in their lives Patients defined and discussed healthy vs unhealthy coping techniques Patients identified their preferred coping techniques and identified whether these were healthy or unhealthy Patients determined 2-3 healthy coping skills they would like to become more familiar with and use more often. Patients provided support and ideas to each other   Summary of Patient Progress:    Patient did not attend group.   Therapeutic Modalities Cognitive Behavioral Therapy Motivational Interviewing  [name] Niece, LCSWA 07/02/2024  3:44 PM

## 2024-07-02 NOTE — Progress Notes (Signed)
 SI/HI: denies  Behavior/Mood: Alert and oriented. Paranoid. Flat affect. RIS. Denies anxiety and depression. Denies AVH.   Interaction/Group: isolative. Did not participant in group.  Medications/PRNs: pt refused Haldol  stating, it makes her lazy.   Pain:denies  Other: refused bedtime snacks. Slept 9.25 hours   07/01/24 2100  Psych Admission Type (Psych Patients Only)  Admission Status Involuntary  Psychosocial Assessment  Patient Complaints None  Eye Contact Brief  Facial Expression Flat  Affect Preoccupied  Speech Soft  Interaction Isolative  Motor Activity Slow  Appearance/Hygiene In scrubs  Behavior Characteristics Resistant to care  Mood Preoccupied  Thought Process  Coherency WDL  Content Preoccupation  Delusions None reported or observed  Perception WDL  Hallucination None reported or observed  Judgment Impaired  Confusion Mild  Danger to Self  Current suicidal ideation? Denies

## 2024-07-02 NOTE — Progress Notes (Signed)
°   07/02/24 0830  Psych Admission Type (Psych Patients Only)  Admission Status Involuntary  Psychosocial Assessment  Patient Complaints None  Eye Contact Brief  Facial Expression Flat  Affect Preoccupied  Speech Soft  Interaction Isolative  Motor Activity Slow  Appearance/Hygiene In scrubs  Behavior Characteristics Resistant to care  Mood Preoccupied  Aggressive Behavior  Targets Other (Comment) (staff)  Type of Behavior Verbal  Effect No apparent injury  Thought Process  Coherency WDL  Content Preoccupation  Delusions None reported or observed  Perception WDL  Hallucination None reported or observed  Judgment Impaired  Confusion Mild  Danger to Self  Current suicidal ideation? Denies  Danger to Others  Danger to Others None reported or observed

## 2024-07-02 NOTE — [facility] Group Notes (Signed)
 [facility] Group Notes:  (Nursing)  Date:  07/02/2024  Time:  3:46 PM  Type of Therapy:  Nurse Education  Participation Level:  Did Not Attend  Participation Quality:  did not attend  Affect:  Flat  Cognitive:  Lacking  Insight:  Lacking  Engagement in Group:  did not attend  Modes of Intervention:  Education  Summary of Progress/Problems: Patient encouraged to attend group did not comply Patient in her room. Support and encouragement provided.   Zona  Brightyn Mozer 07/02/2024, 3:46 PM

## 2024-07-03 NOTE — Group Note (Signed)
 Recreation Therapy Group Note   Group Topic:Emotion Expression  Group Date: 07/03/2024 Start Time: 1410 End Time: 1500 Facilitators: Celestia Jeoffrey BRAVO, LRT, CTRS Location: Craft Room  Group Description: Positivity Collage. LRT and patients discussed the importance of having a positive mindset and being happy. Patients received magazines, safety scissors, a glue stick and a piece of paper. Pts were encouraged to find images or words in the magazines that showed happiness or positivity to them. Pt shared their collage with the group once they were finished. LRT and pts discussed how it can be difficult to always have a positive mindset, especially when they have mental health challenges.   Goal Area(s) Addressed:  Pt will identify things associate with positivity. Pt will reduce negative thinking. Pt will identify a new coping skill of thinking positive thoughts.    Affect/Mood: [name]   Participation Level: Did not attend    Clinical Observations/Individualized Feedback: Patient did not attend.  Plan: Continue to engage patient in RT group sessions 2-3x/week.   Jeoffrey BRAVO Celestia, LRT, CTRS 07/03/2024 4:27 PM

## 2024-07-03 NOTE — Plan of Care (Signed)
  Problem: Safety: Goal: Periods of time without injury will increase Outcome: Progressing   Problem: Activity: Goal: Interest or engagement in activities will improve Outcome: Not Progressing   Problem: Health Behavior/Discharge Planning: Goal: Compliance with treatment plan for underlying cause of condition will improve Outcome: Not Progressing

## 2024-07-03 NOTE — Group Note (Signed)
 Date:  07/03/2024 Time:  12:06 PM  Group Topic/Focus:   Coping With Mental Health Crisis:   The purpose of this group is to help patients identify strategies for coping with mental health crisis.  Group discusses possible causes of crisis and ways to manage them effectively. Personal Choices and Values:   The focus of this group is to help patients assess and explore the importance of values in their lives, how their values affect their decisions, how they express their values and what opposes their expression.  The patients had the opportunity to create vision boards and set goals for the upcoming new year. We discussed new goals and dreams, along with realistic time frames for accomplishing them. This activity encouraged reflection, motivation, and hope. It also served as a print production planner experience and promoted positivity and a more optimistic outlook on life.    Participation Level:  Did Not Attend  Participation Quality:    Affect:   Cognitive:    Insight:   Engagement in Group:    Modes of Intervention:    Additional Comments:    Ximenna Fonseca L Tarvaris Puglia 07/03/2024, 12:06 PM

## 2024-07-03 NOTE — Group Note (Signed)
 Physical/Occupational Therapy Group Note  Group Topic: Yoga  Group Date: 07/03/2024 Start Time: 1500 End Time: 1530 Facilitators: Cuca Benassi, Alm Hamilton, PT   Group Description: Group participated with series of yoga poses, designed to emphasize functional sitting balance, core stability, generalized flexibility and overall posture.  Incorporated deep breathing techniques with poses, working to promote relaxation, mindfulness and focus with targeted activities.  Discussed benefits of yoga in improving mood and self-esteem, reducing stress and anxiety, and promoting functional strength, balance and core stability for each participant.  Discussed ways to integrate into each participants daily routine.  Provided handout with written and pictorial descriptions of included yoga movements to be utilized as appropriate outside of group time.  Therapeutic Goal(s):  Demonstrate safe ability to participate with yoga poses during group activity. Identify one benefit of participation with yoga poses as part of each participants exercise/movement routine. Identify 1-2 individual poses that participant feels most beneficial to his/her needs and that he/she can easily replicate outside of group.  Individual Participation: Did not attend  Participation Level:   Participation Quality:   Behavior:   Speech/Thought Process:   Affect/Mood:   Insight:   Judgement:   Modes of Intervention:   Patient Response to Interventions:    Plan: Continue to engage patient in PT/OT groups 1 - 2x/week.  CHARM Hamilton Bertin PT, DPT 07/03/2024, 4:53 PM

## 2024-07-03 NOTE — Plan of Care (Signed)
  Problem: Education: Goal: Knowledge of [place] [facility] Education information/materials will improve Outcome: Progressing Goal: Emotional status will improve Outcome: Progressing Goal: Mental status will improve Outcome: Progressing Goal: Verbalization of understanding the information provided will improve Outcome: Progressing   Problem: Activity: Goal: Interest or engagement in activities will improve Outcome: Progressing Goal: Sleeping patterns will improve Outcome: Progressing   Problem: Coping: Goal: Ability to verbalize frustrations and anger appropriately will improve Outcome: Progressing Goal: Ability to demonstrate self-control will improve Outcome: Progressing   Problem: Health Behavior/Discharge Planning: Goal: Identification of resources available to assist in meeting health care needs will improve Outcome: Progressing Goal: Compliance with treatment plan for underlying cause of condition will improve Outcome: Progressing   Problem: Physical Regulation: Goal: Ability to maintain clinical measurements within normal limits will improve Outcome: Progressing   Problem: Safety: Goal: Periods of time without injury will increase Outcome: Progressing   Problem: Education: Goal: Knowledge of General Education information will improve Description: Including pain rating scale, medication(s)/side effects and non-pharmacologic comfort measures Outcome: Progressing   Problem: Health Behavior/Discharge Planning: Goal: Ability to manage health-related needs will improve Outcome: Progressing   Problem: Clinical Measurements: Goal: Ability to maintain clinical measurements within normal limits will improve Outcome: Progressing Goal: Will remain free from infection Outcome: Progressing Goal: Diagnostic test results will improve Outcome: Progressing Goal: Respiratory complications will improve Outcome: Progressing Goal: Cardiovascular complication will be  avoided Outcome: Progressing   Problem: Activity: Goal: Risk for activity intolerance will decrease Outcome: Progressing   Problem: Nutrition: Goal: Adequate nutrition will be maintained Outcome: Progressing   Problem: Coping: Goal: Level of anxiety will decrease Outcome: Progressing   Problem: Elimination: Goal: Will not experience complications related to bowel motility Outcome: Progressing Goal: Will not experience complications related to urinary retention Outcome: Progressing   Problem: Pain Managment: Goal: General experience of comfort will improve and/or be controlled Outcome: Progressing   Problem: Safety: Goal: Ability to remain free from injury will improve Outcome: Progressing   Problem: Skin Integrity: Goal: Risk for impaired skin integrity will decrease Outcome: Progressing   Problem: Health Behavior/Discharge Planning: Goal: Compliance with prescribed medication regimen will improve Outcome: Progressing   Problem: Nutritional: Goal: Ability to achieve adequate nutritional intake will improve Outcome: Progressing

## 2024-07-03 NOTE — Progress Notes (Signed)
 Behavior:  Isolates.     Psych assessment:  Denies SI.    Interaction / Group attendance:  Present in milieu for meals.  Does not attend groups. No interaction with peers  Minimal interaction with staff.    Medication/ PRNs: Refused Haldol .  Dr. Ruther aware.  Pain: Denies.  15 min checks in place for safety.

## 2024-07-03 NOTE — Progress Notes (Addendum)
 Pt denies SI/HI/AVH this shift. She continues to refuse to take scheduled Haldol  due to it makes me lazy and they keep trying to give that to me. Pt became irritable when educated on the benefits and risks of medication. MD aware of the refusal. She is safe on the unit at this time with Q65min safety checks in place.    07/02/24 2118  Psych Admission Type (Psych Patients Only)  Admission Status Involuntary  Psychosocial Assessment  Patient Complaints None  Eye Contact Brief  Facial Expression Flat  Affect Preoccupied  Speech Argumentative  Interaction Isolative  Motor Activity Slow  Appearance/Hygiene In scrubs;Disheveled  Behavior Characteristics Resistant to care  Mood Preoccupied;Suspicious  Thought Process  Coherency WDL  Content Preoccupation  Delusions Paranoid  Perception WDL  Hallucination None reported or observed  Judgment Impaired  Confusion Mild  Danger to Self  Current suicidal ideation? Denies  Danger to Others  Danger to Others None reported or observed

## 2024-07-03 NOTE — Group Note (Signed)
 Date:  07/03/2024 Time:  9:34 PM  Group Topic/Focus:  Wrap-Up Group:   The focus of this group is to help patients review their daily goal of treatment and discuss progress on daily workbooks.    Participation Level:  Did Not Attend  Participation Quality:     Affect:     Cognitive:     Insight: None  Engagement in Group:     Modes of Intervention:     Additional Comments:    [name] 07/03/2024, 9:34 PM

## 2024-07-04 MED ORDER — HALOPERIDOL 5 MG PO TABS
5.0000 mg | ORAL_TABLET | Freq: Every day | ORAL | Status: DC
  Filled 2024-07-04: qty 1

## 2024-07-04 NOTE — Progress Notes (Signed)
 Phone call back [facility] MD Progress Note  07/04/2024 12:01 PM [name]  MRN:  [id number]  [name] is a 63 y.o. female admitted: [facility] 05/02/2024 10:56 AM for dehydration, failure to thrive and hypoglycemia. She carries the psychiatric diagnoses of Schizophrenia and has medical hx of hypoglycemia and failure to thrive and has a past medical history of sinus bradycardia, chronic anemia, severe malnutrition and recurrent hypoglycemia.. Her current presentation of verbal aggression, agitation and refusal to eat with severe paranoia is most consistent with paranoid schizophrenia. [name]  She was at [facility] where she was admitted on 04/30/2024.  She was transferred to the ED because of failure to thrive and was admitted to medical floor.Patient is on IVC and patient has been adamantly refusing all oral medications and refusing to eat.  Patient is admitted to [facility] unit with Q15 min safety monitoring. Multidisciplinary team approach is offered. Medication management; group/milieu therapy is offered.    Subjective:  Chart reviewed, case discussed in multidisciplinary meeting, patient seen during rounds.   Patient is noted to be sitting in the bed.  She remains hostile and refuses to engage in a meaningful interview.  She insist that she is ready to go home but is unable to give details about her home address or her family details.  When asked about Haldol  she refused to answer and asked the provider to leave the room.  Emailed the legal guardian for consent for nonemergent forced medication.  The team is still waiting to get a response to the email Past Psychiatric History: see h&P Family History: History reviewed. No pertinent family history. Social History:  Social History   Substance and Sexual Activity  Alcohol Use Not Currently   Comment: Refuses to disclose how much     Social History   Substance and Sexual Activity  Drug Use No   Comment: Refuses to answer    Social History    Socioeconomic History   Marital status: Single    Spouse name: Not on file   Number of children: Not on file   Years of education: Not on file   Highest education level: Not on file  Occupational History   Not on file  Tobacco Use   Smoking status: Every Day    Current packs/day: 2.00    Types: Cigarettes    Passive exposure: Current   Smokeless tobacco: Never  Vaping Use   Vaping status: Never Used  Substance and Sexual Activity   Alcohol use: Not Currently    Comment: Refuses to disclose how much   Drug use: No    Comment: Refuses to answer   Sexual activity: Not Currently    Comment: refused to answer  Other Topics Concern   Not on file  Social History Narrative   Not on file   Social Drivers of Health   Tobacco Use: High Risk (05/06/2024)   Patient History    Smoking Tobacco Use: Every Day    Smokeless Tobacco Use: Never    Passive Exposure: Current  Financial Resource Strain: Not on file  Food Insecurity: Patient Declined (05/06/2024)   Epic    Worried About Programme Researcher, Broadcasting/film/video in the Last Year: Patient declined    Barista in the Last Year: Patient declined  Transportation Needs: Patient Declined (05/06/2024)   Epic    Lack of Transportation (Medical): Patient declined    Lack of Transportation (Non-Medical): Patient declined  Physical Activity: Not on file  Stress: Not on  file  Social Connections: Not on file  Depression [phone]): Not on file  Alcohol Screen: Low Risk (05/06/2024)   Alcohol Screen    Last Alcohol Screening Score (AUDIT): 0  Housing: Unknown (05/06/2024)   Epic    Unable to Pay for Housing in the Last Year: Patient declined    Number of Times Moved in the Last Year: Not on file    Homeless in the Last Year: Not on file  Utilities: Patient Declined (05/06/2024)   Epic    Threatened with loss of utilities: Patient declined  Health Literacy: Not on file   Past Medical History:  Past Medical History:  Diagnosis Date    Anemia 10/02/2021   Non compliance w medication regimen    Schizophrenia [facility])     Past Surgical History:  Procedure Laterality Date   BIOPSY  09/25/2021   Procedure: BIOPSY;  Surgeon: Teressa Toribio SQUIBB, MD;  Location: [facility];  Service: Gastroenterology;;   ESOPHAGOGASTRODUODENOSCOPY (EGD) WITH PROPOFOL  [name] 09/25/2021   Procedure: ESOPHAGOGASTRODUODENOSCOPY (EGD) WITH PROPOFOL ;  Surgeon: Teressa Toribio SQUIBB, MD;  Location: [facility];  Service: Gastroenterology;  Laterality: [name];  With NGT placement    Current Medications: Current Facility-Administered Medications  Medication Dose Route Frequency Provider Last Rate Last Admin   acetaminophen  (TYLENOL ) tablet 650 mg  650 mg Oral Q6H PRN Starkes-Perry, Takia S, FNP       alum & mag hydroxide-simeth (MAALOX/MYLANTA) 200-200-20 MG/5ML suspension 30 mL  30 mL Oral Q4H PRN Starkes-Perry, Takia S, FNP       haloperidol  (HALDOL ) tablet 5 mg  5 mg Oral BID Rodriguez Aguinaldo, MD       HYDROcodone -acetaminophen  (NORCO/VICODIN) 5-325 MG per tablet 1-2 tablet  1-2 tablet Oral Q4H PRN Starkes-Perry, Takia S, FNP       magnesium  hydroxide (MILK OF MAGNESIA) suspension 30 mL  30 mL Oral Daily PRN Starkes-Perry, Takia S, FNP       multivitamin with minerals tablet 1 tablet  1 tablet Oral Daily Wilkie Majel RAMAN, FNP   1 tablet at 07/04/24 9076   OLANZapine  (ZYPREXA ) injection 5 mg  5 mg Intramuscular TID PRN Wilkie Majel RAMAN, FNP       OLANZapine  (ZYPREXA ) tablet 15 mg  15 mg Oral BID Kiera Hussey, MD   15 mg at 07/04/24 0923   OLANZapine  zydis (ZYPREXA ) disintegrating tablet 5 mg  5 mg Oral TID PRN Starkes-Perry, Takia S, FNP       ondansetron  (ZOFRAN ) tablet 4 mg  4 mg Oral Q6H PRN Starkes-Perry, Majel RAMAN, FNP       Or   ondansetron  (ZOFRAN ) injection 4 mg  4 mg Intravenous Q6H PRN Starkes-Perry, Majel RAMAN, FNP       thiamine  (VITAMIN B1) tablet 100 mg  100 mg Oral Daily Wilkie Majel RAMAN, FNP   100 mg at 07/04/24 9076   traZODone  (DESYREL )  tablet 50 mg  50 mg Oral QHS Starkes-Perry, Takia S, FNP   50 mg at 07/03/24 2126    Lab Results:  No results found for this or any previous visit (from the past 48 hours).   Blood Alcohol level:  Lab Results  Component Value Date   [facility] <15 05/02/2024   ETH <15 05/01/2024    Metabolic Disorder Labs: Lab Results  Component Value Date   HGBA1C 5.6 05/14/2024   MPG 114.02 05/14/2024   MPG 116.89 05/02/2024   Lab Results  Component Value Date   PROLACTIN 13.6 04/16/2015   Lab Results  Component Value Date   CHOL 219 (H) 05/14/2024   TRIG 84 05/14/2024   HDL 83 05/14/2024   CHOLHDL 2.6 05/14/2024   VLDL 17 05/14/2024   LDLCALC 119 (H) 05/14/2024   LDLCALC 130 (H) 05/01/2024    Physical Findings: AIMS:  , ,  ,  ,    CIWA:    COWS:      Psychiatric Specialty Exam:  Presentation  General Appearance:  Casual  Eye Contact: Fair  Speech: Normal rate  Speech Volume: Increased    Mood and Affect  Mood: Angry  Affect: Congruent   Thought Process  Thought Processes: Disorganized  Descriptions of Associations:Tangential  Orientation:Partial  Thought Content:Illogical; Tangential  Hallucinations:RESPONDING TO INTERNAL STIMULI Ideas of Reference:Paranoia; Delusions; Percusatory  Suicidal Thoughts:not endorsing Homicidal Thoughts:nor endorsing  Sensorium  Memory: Recent Poor  Judgment: Poor  Insight: Poor   Executive Functions  Concentration: Poor  Attention Span: Poor  Recall: Poor  Fund of Knowledge: Poor  Language: Poor   Psychomotor Activity  Psychomotor Activity:No data recorded Musculoskeletal: Strength & Muscle Tone: within normal limits Gait & Station: normal Assets  Assets: Resilience    Physical Exam: Physical Exam Vitals and nursing note reviewed.    ROS Blood pressure 104/67, pulse 69, temperature 98 F (36.7 C), resp. rate 17, height 5' 4 (1.626 m), weight 44.9 kg, SpO2 99%. Body mass index is  16.99 kg/m.  Diagnosis: Active Problems:   Schizophrenia, paranoid (HCC) Paranoid  PLAN: Safety and Monitoring:  -- Involuntary admission to inpatient psychiatric unit for safety, stabilization and treatment  -- Daily contact with patient to assess and evaluate symptoms and progress in treatment  -- Patient's case to be discussed in multi-disciplinary team meeting  -- Observation Level : q15 minute checks  -- Vital signs:  q12 hours  -- Precautions: suicide, elopement, and assault -- Encouraged patient to participate in unit milieu and in scheduled group therapies  2. Psychiatric Diagnoses and Treatment: Patient  2 physicians approved non emergent forced medications-Dr.Madaram and Dr.Rohaan Durnil  Zyprexa  15 mg BID oral or Im -non emergent forced medication-patient is taking her oral medication Haldol  5 mg bid added -DSS gave consent . Discharge Planning:   -- Social work and case management to assist with discharge planning and identification of hospital follow-up needs prior to discharge  -- Estimated LOS: 3-4 days  Allyn Foil, MD 07/04/2024, 12:01 PM

## 2024-07-04 NOTE — Progress Notes (Signed)
 Behavior:  Cooperative and polite with staff.    Psych assessment: Denies SI.  Interaction / Group attendance:  Isolates to room.  No interaction with peers.  Does not attend groups.  Medication/ PRNs: Refused Haldol .  Dr.J made aware.   Pain:  Denies.  15 min checks in place for safety.

## 2024-07-04 NOTE — Progress Notes (Signed)
°   07/03/24 2000  Psych Admission Type (Psych Patients Only)  Admission Status Involuntary  Psychosocial Assessment  Patient Complaints None  Eye Contact Avoids  Facial Expression Flat  Affect Flat  Speech Soft  Interaction Minimal  Motor Activity Restless  Appearance/Hygiene In scrubs  Behavior Characteristics Calm  Mood Preoccupied;Suspicious  Thought Process  Coherency WDL  Content Preoccupation  Delusions None reported or observed  Perception WDL  Hallucination None reported or observed  Judgment Poor  Confusion Mild  Danger to Self  Current suicidal ideation? Denies  Danger to Others  Danger to Others None reported or observed

## 2024-07-04 NOTE — BH IP Treatment Plan (Signed)
 Interdisciplinary Treatment and Diagnostic Plan Update  07/04/2024 Time of Session: 9:00 AM Natilee Gauer MRN: [id number]  Principal Diagnosis: <principal problem not specified>  Secondary Diagnoses: Active Problems:   Schizophrenia, paranoid (HCC)   Current Medications:  Current Facility-Administered Medications  Medication Dose Route Frequency Provider Last Rate Last Admin   acetaminophen  (TYLENOL ) tablet 650 mg  650 mg Oral Q6H PRN Starkes-Perry, Takia S, FNP       alum & mag hydroxide-simeth (MAALOX/MYLANTA) 200-200-20 MG/5ML suspension 30 mL  30 mL Oral Q4H PRN Starkes-Perry, Takia S, FNP       haloperidol  (HALDOL ) tablet 5 mg  5 mg Oral QHS Jadapalle, Sree, MD       HYDROcodone -acetaminophen  (NORCO/VICODIN) 5-325 MG per tablet 1-2 tablet  1-2 tablet Oral Q4H PRN Starkes-Perry, Takia S, FNP       magnesium  hydroxide (MILK OF MAGNESIA) suspension 30 mL  30 mL Oral Daily PRN Starkes-Perry, Takia S, FNP       multivitamin with minerals tablet 1 tablet  1 tablet Oral Daily Wilkie Majel RAMAN, FNP   1 tablet at 07/04/24 9076   OLANZapine  (ZYPREXA ) injection 5 mg  5 mg Intramuscular TID PRN Wilkie Majel RAMAN, FNP       OLANZapine  (ZYPREXA ) tablet 15 mg  15 mg Oral BID Jadapalle, Sree, MD   15 mg at 07/04/24 9076   OLANZapine  zydis (ZYPREXA ) disintegrating tablet 5 mg  5 mg Oral TID PRN Starkes-Perry, Takia S, FNP       ondansetron  (ZOFRAN ) tablet 4 mg  4 mg Oral Q6H PRN Wilkie Majel RAMAN, FNP       Or   ondansetron  (ZOFRAN ) injection 4 mg  4 mg Intravenous Q6H PRN Starkes-Perry, Majel RAMAN, FNP       thiamine  (VITAMIN B1) tablet 100 mg  100 mg Oral Daily Starkes-Perry, Takia S, FNP   100 mg at 07/04/24 9076   traZODone  (DESYREL ) tablet 50 mg  50 mg Oral QHS Starkes-Perry, Takia S, FNP   50 mg at 07/03/24 2126   PTA Medications: Medications Prior to Admission  Medication Sig Dispense Refill Last Dose/Taking   feeding supplement (ENSURE PLUS HIGH PROTEIN) LIQD Take 237 mLs by  mouth 3 (three) times daily between meals.      Multiple Vitamin (MULTIVITAMIN WITH MINERALS) TABS tablet Take 1 tablet by mouth daily.      OLANZapine  (ZYPREXA ) 5 MG tablet Take 1 tablet (5 mg total) by mouth 2 (two) times daily.      OLANZapine  (ZYPREXA ) injection Inject 1 mL (5 mg total) into the muscle 2 (two) times daily.      thiamine  (VITAMIN B-1) 100 MG tablet Take 1 tablet (100 mg total) by mouth daily.      traZODone  (DESYREL ) 50 MG tablet Take 1 tablet (50 mg total) by mouth at bedtime.       Patient Stressors:    Patient Strengths:    Treatment Modalities: Medication Management, Group therapy, Case management,  1 to 1 session with clinician, Psychoeducation, Recreational therapy.   Physician Treatment Plan for Primary Diagnosis: <principal problem not specified> Long Term Goal(s): Improvement in symptoms so as ready for discharge   Short Term Goals: Ability to identify changes in lifestyle to reduce recurrence of condition will improve  Medication Management: Evaluate patient's response, side effects, and tolerance of medication regimen.  Therapeutic Interventions: 1 to 1 sessions, Unit Group sessions and Medication administration.  Evaluation of Outcomes: Progressing  Physician Treatment Plan for Secondary Diagnosis: Active Problems:  Schizophrenia, paranoid (HCC)  Long Term Goal(s): Improvement in symptoms so as ready for discharge   Short Term Goals: Ability to identify changes in lifestyle to reduce recurrence of condition will improve     Medication Management: Evaluate patient's response, side effects, and tolerance of medication regimen.  Therapeutic Interventions: 1 to 1 sessions, Unit Group sessions and Medication administration.  Evaluation of Outcomes: Progressing   RN Treatment Plan for Primary Diagnosis: <principal problem not specified> Long Term Goal(s): Knowledge of disease and therapeutic regimen to maintain health will improve  Short Term  Goals: Ability to remain free from injury will improve, Ability to verbalize frustration and anger appropriately will improve, Ability to demonstrate self-control, Ability to participate in decision making will improve, Ability to verbalize feelings will improve, Ability to disclose and discuss suicidal ideas, Ability to identify and develop effective coping behaviors will improve, and Compliance with prescribed medications will improve   Medication Management: RN will administer medications as ordered by provider, will assess and evaluate patient's response and provide education to patient for prescribed medication. RN will report any adverse and/or side effects to prescribing provider.  Therapeutic Interventions: 1 on 1 counseling sessions, Psychoeducation, Medication administration, Evaluate responses to treatment, Monitor vital signs and CBGs as ordered, Perform/monitor CIWA, COWS, AIMS and Fall Risk screenings as ordered, Perform wound care treatments as ordered.  Evaluation of Outcomes: Progressing   LCSW Treatment Plan for Primary Diagnosis: <principal problem not specified> Long Term Goal(s): Safe transition to appropriate next level of care at discharge, Engage patient in therapeutic group addressing interpersonal concerns.  Short Term Goals: Engage patient in aftercare planning with referrals and resources, Increase social support, Increase ability to appropriately verbalize feelings, Increase emotional regulation, Facilitate acceptance of mental health diagnosis and concerns, Facilitate patient progression through stages of change regarding substance use diagnoses and concerns, Identify triggers associated with mental health/substance abuse issues, and Increase skills for wellness and recovery   Therapeutic Interventions: Assess for all discharge needs, 1 to 1 time with Social worker, Explore available resources and support systems, Assess for adequacy in community support network, Educate  family and significant other(s) on suicide prevention, Complete Psychosocial Assessment, Interpersonal group therapy.  Evaluation of Outcomes: Progressing   Progress in Treatment: Attending groups: No. 07/04/24 Update: No.  Participating in groups: No.  07/04/24 Update: No.  Taking medication as prescribed: Yes.  07/04/24 Update: Yes.  Toleration medication: Yes. 07/04/24 Update: Yes.  Family/Significant other contact made: No, will contact:  legal guardian 07/04/24 Update: Yes. Etha Chang, [phone]  Patient understands diagnosis: No. 07/04/24 Update: No.  Discussing patient identified problems/goals with staff: Yes. 07/04/24 Update: Yes.  Medical problems stabilized or resolved: Yes. 07/04/24 Update: Yes.  Denies suicidal/homicidal ideation: Yes. 07/04/24 Update: Yes.  Issues/concerns per patient self-inventory: No. 07/04/24 Update: No.  Other: None 07/04/24 Update: None.   New problem(s) identified: No, Describe:  None identified. Update: 05/13/2024 No changes at this time. Update 05/18/2024:  No changes at this time. Update 05/23/2024:  No changes at this time. Update 05/28/2024:  No changes at this time. Update 06/02/2024:  No changes at this time. Update 06/07/2024:  No changes at this time. 06/12/24 Update: Patient has been isolative to her room with minimal interaction. Update 06/18/24:  No changes at this time  Update 06/24/24:  No changes at this time Update 06/29/24:  No changes at this time 07/04/24 Update: No changes.   New Short Term/Long Term Goal(s): elimination of symptoms of psychosis, medication management for mood  stabilization; elimination of SI thoughts; development of comprehensive mental wellness plan. Update: 05/13/2024 No changes at this time.Update: 05/18/2024 No changes at this time. Update 05/23/2024:  No changes at this time. Update 05/28/2024:  No changes at this time. Update 06/02/2024:  No changes at this time. Update 06/07/2024:  No changes at this time.  06/12/24 Update: No changes at this time. Update 06/18/24:  No changes at this time  Update 06/24/24:  No changes at this time Update 06/29/24:  No changes at this time 07/04/24 Update: No changes.   Patient Goals:  I just need to leave, I don't need it hereUpdate: 05/13/2024 No changes at this time.Update: 05/18/2024 No changes at this time. Update 05/23/2024:  No changes at this time.Update 05/28/2024:  No changes at this time. Update 06/02/2024:  No changes at this time. Update 06/07/2024:  No changes at this time. 06/12/24 Update: No changes at this time. Update 06/18/24:  No changes at this time Update 06/24/24:  No changes at this time Update 06/29/24:  No changes at this time 07/04/24 Update: No changes.   Discharge Plan or Barriers:  Discharge Plan or Barriers: CSW will assist with appropriate discharge planning. Update: 05/13/2024 No changes at this time.Update: 05/18/2024 No changes at this time. Update 05/23/2024:  No changes at this time.Update 05/28/2024:  CSW unable to identify appropriate decision maker, as pt's legal guardian has passed and CSW has reached out to APS caseworker multiple times to get clarity on protocol for enforcing medication until an appropriate party has been legal identified Update 06/02/2024:  No changes at this time. Update 06/07/2024:  CSW and provider still working to clarify appropriate horticulturist, commercial. Please refer to extensive notes in medical record. 06/12/24 Update: CSW and provider still working with DSS to obtain legal decision maker on patient's behalf. Update 06/18/24:  No changes at this time Update 06/24/24:  DSS received ex-parte, DSS to begin looking for placement, Dr.J to initiate enforced medication Update 06/29/24:  No changes at this time 07/04/24 Update: CSW to continue to provide support to APS case worker while DSS works on clinical cytogeneticist.   Reason for Continuation of Hospitalization: Aggression Hallucinations Medication stabilization  07/04/24 Update: No changes.    Estimated Length of Stay: 1 to 7 days. Update: 05/13/2024 TBDUpdate: 05/18/2024 TBD Update 05/23/2024:  TBD Update 05/28/2024:  TBDUpdate 06/02/2024:  TBD Update 06/07/2024:  TBD 06/12/24 Update: TBD. Update 06/18/24:  TBD Update 06/24/24:  TBD Update 06/29/24:  TBD 07/04/24 Update: TBD.      Last 3 [place] Suicide Severity Risk Score: Flowsheet Row Admission (Current) from 05/06/2024 in [facility] to Hosp-Admission (Discharged) from 05/02/2024 in [facility] from 04/30/2024 in [facility]  C-SSRS RISK CATEGORY No Risk No Risk No Risk    Last [facility] 2/9 Scores:     No data to display          Scribe for Treatment Team: Briane Birden M Tonianne Fine, KEN 07/04/2024 2:12 PM

## 2024-07-04 NOTE — Group Note (Signed)
 Date:  07/04/2024 Time:  3:52 PM  Group Topic/Focus:  Healthy Communication:   The focus of this group is to discuss communication, barriers to communication, as well as healthy ways to communicate with others. Making Healthy Choices:   The focus of this group is to help patients identify negative/unhealthy choices they were using prior to admission and identify positive/healthier coping strategies to replace them upon discharge.  I provided structured worksheets designed to promote cognitive engagement, social interaction, and conversation. Worksheets included activities that encouraged critical thinking and verbal expression, such as This or That (Would You Rather) questions and prompts focused on favorite memories. The group reviewed the worksheet together, with questions read aloud to support comprehension and inclusion.  In addition to the worksheet activities, the group participated in Guess That Song, which encouraged memory recall, attention, and peer interaction. An acts of kindness discussion was also facilitated, allowing participants to reflect on positive behaviors and share personal experiences.  Participants demonstrated engagement through verbal responses, shared memories, and interaction with peers. The group setting promoted socialization, reminiscence, and positive affect. Modifications were provided as needed, including verbal prompting and allowing responses to be given verbally rather than written.  The session supported cognitive stimulation, emotional expression, and group cohesion in a supportive environment.    Participation Level:  Did Not Attend  Participation Quality:    Affect:    Cognitive:    Insight:  Engagement in Group:    Modes of Intervention:    Additional Comments:    Yasmyn Bellisario L Murray Guzzetta 07/04/2024, 3:52 PM

## 2024-07-04 NOTE — Plan of Care (Signed)
  Problem: Education: Goal: Knowledge of [place] [facility] Education information/materials will improve Outcome: Progressing Goal: Emotional status will improve Outcome: Progressing Goal: Mental status will improve Outcome: Progressing Goal: Verbalization of understanding the information provided will improve Outcome: Progressing   Problem: Activity: Goal: Interest or engagement in activities will improve Outcome: Progressing Goal: Sleeping patterns will improve Outcome: Progressing   Problem: Coping: Goal: Ability to verbalize frustrations and anger appropriately will improve Outcome: Progressing Goal: Ability to demonstrate self-control will improve Outcome: Progressing   Problem: Health Behavior/Discharge Planning: Goal: Identification of resources available to assist in meeting health care needs will improve Outcome: Progressing Goal: Compliance with treatment plan for underlying cause of condition will improve Outcome: Progressing   Problem: Physical Regulation: Goal: Ability to maintain clinical measurements within normal limits will improve Outcome: Progressing   Problem: Safety: Goal: Periods of time without injury will increase Outcome: Progressing   Problem: Education: Goal: Knowledge of General Education information will improve Description: Including pain rating scale, medication(s)/side effects and non-pharmacologic comfort measures Outcome: Progressing   Problem: Health Behavior/Discharge Planning: Goal: Ability to manage health-related needs will improve Outcome: Progressing   Problem: Clinical Measurements: Goal: Ability to maintain clinical measurements within normal limits will improve Outcome: Progressing Goal: Will remain free from infection Outcome: Progressing Goal: Diagnostic test results will improve Outcome: Progressing Goal: Respiratory complications will improve Outcome: Progressing Goal: Cardiovascular complication will be  avoided Outcome: Progressing   Problem: Activity: Goal: Risk for activity intolerance will decrease Outcome: Progressing   Problem: Nutrition: Goal: Adequate nutrition will be maintained Outcome: Progressing   Problem: Coping: Goal: Level of anxiety will decrease Outcome: Progressing   Problem: Elimination: Goal: Will not experience complications related to bowel motility Outcome: Progressing Goal: Will not experience complications related to urinary retention Outcome: Progressing   Problem: Pain Managment: Goal: General experience of comfort will improve and/or be controlled Outcome: Progressing   Problem: Safety: Goal: Ability to remain free from injury will improve Outcome: Progressing   Problem: Skin Integrity: Goal: Risk for impaired skin integrity will decrease Outcome: Progressing   Problem: Health Behavior/Discharge Planning: Goal: Compliance with prescribed medication regimen will improve Outcome: Progressing   Problem: Nutritional: Goal: Ability to achieve adequate nutritional intake will improve Outcome: Progressing

## 2024-07-04 NOTE — Group Note (Signed)
 Recreation Therapy Group Note   Group Topic:General Recreation  Group Date: 07/04/2024 Start Time: 1405 End Time: 1450 Facilitators: Celestia Jeoffrey BRAVO, LRT, CTRS Location: Courtyard  Group Description: [employer]. LRT and patients played games of basketball, drew with chalk, and played corn hole while outside in the courtyard while getting fresh air and sunlight. Music was being played in the background. LRT and peers conversed about different games they have played before, what they do in their free time and anything else that is on their minds. LRT encouraged pts to drink water  after being outside, sweating and getting their heart rate up.  Goal Area(s) Addressed: Patient will build on frustration tolerance skills. Patients will partake in a competitive play game with peers. Patients will gain knowledge of new leisure interest/hobby.     Affect/Mood: [name]   Participation Level: Did not attend    Clinical Observations/Individualized Feedback: Patient did not attend group.   Plan: Continue to engage patient in RT group sessions 2-3x/week.   Jeoffrey BRAVO Celestia, LRT, CTRS 07/04/2024 4:26 PM

## 2024-07-04 NOTE — Group Note (Signed)
 Date:  07/04/2024 Time:  11:34 AM  Group Topic/Focus:  Goals Group:   The focus of this group is to help patients establish daily goals to achieve during treatment and discuss how the patient can incorporate goal setting into their daily lives to aide in recovery. Healthy Communication:   The focus of this group is to discuss communication, barriers to communication, as well as healthy ways to communicate with others.    Participation Level:  Did Not Attend  Participation Quality:    Affect:    Cognitive:    Insight:   Engagement in Group:    Modes of Intervention:    Additional Comments:    Venise Ellingwood L Ramona Slinger 07/04/2024, 11:34 AM

## 2024-07-04 NOTE — Group Note (Signed)
 Date:  07/04/2024 Time:  11:05 PM  Group Topic/Focus:  Wrap-Up Group:   The focus of this group is to help patients review their daily goal of treatment and discuss progress on daily workbooks.    Participation Level:  Did Not Attend  Participation Quality:     Affect:     Cognitive:     Insight: None  Engagement in Group:  None  Modes of Intervention:     Additional Comments:    [name] 07/04/2024, 11:05 PM

## 2024-07-04 NOTE — Progress Notes (Signed)
°   07/04/24 2200  Psych Admission Type (Psych Patients Only)  Admission Status Involuntary  Psychosocial Assessment  Patient Complaints None  Eye Contact Fair  Facial Expression Flat  Affect Flat  Speech Soft  Interaction Minimal  Motor Activity Slow  Appearance/Hygiene In scrubs  Behavior Characteristics Cooperative;Calm  Mood Preoccupied  Thought Process  Coherency WDL  Content Preoccupation  Delusions None reported or observed  Perception WDL  Hallucination None reported or observed  Judgment Poor  Confusion Mild  Danger to Self  Current suicidal ideation? Denies  Danger to Others  Danger to Others None reported or observed   Patient continues to refuse her Haloperidol .

## 2024-07-04 NOTE — Plan of Care (Signed)
  Problem: Coping: Goal: Ability to demonstrate self-control will improve Outcome: Progressing   Problem: Activity: Goal: Interest or engagement in activities will improve Outcome: Not Progressing   Problem: Health Behavior/Discharge Planning: Goal: Compliance with treatment plan for underlying cause of condition will improve Outcome: Not Progressing

## 2024-07-04 NOTE — Group Note (Signed)
 LCSW Group Therapy Note   Group Date: 07/04/2024 Start Time: 1315 End Time: 1330   Type of Therapy and Topic:  Group Therapy: Boundaries  Participation Level:  Did Not Attend  Description of Group: This group will address the use of boundaries in their personal lives. Patients will explore why boundaries are important, the difference between healthy and unhealthy boundaries, and negative and postive outcomes of different boundaries and will look at how boundaries can be crossed.  Patients will be encouraged to identify current boundaries in their own lives and identify what kind of boundary is being set. Facilitators will guide patients in utilizing problem-solving interventions to address and correct types boundaries being used and to address when no boundary is being used. Understanding and applying boundaries will be explored and addressed for obtaining and maintaining a balanced life. Patients will be encouraged to explore ways to assertively make their boundaries and needs known to significant others in their lives, using other group members and facilitator for role play, support, and feedback.  Therapeutic Goals:  1.  Patient will identify areas in their life where setting clear boundaries could be  used to improve their life.  2.  Patient will identify signs/triggers that a boundary is not being respected. 3.  Patient will identify two ways to set boundaries in order to achieve balance in  their lives: 4.  Patient will demonstrate ability to communicate their needs and set boundaries  through discussion and/or role plays  Summary of Patient Progress:  X  Therapeutic Modalities:   Cognitive Behavioral Therapy Solution-Focused Therapy  [name], LCSWA 07/04/2024  1:47 PM

## 2024-07-05 NOTE — Group Note (Signed)
 Date:  07/05/2024 Time:  8:48 PM  Group Topic/Focus:  Wrap-Up Group:   The focus of this group is to help patients review their daily goal of treatment and discuss progress on daily workbooks.    Participation Level:  Did Not Attend  Participation Quality:    Affect:  \  Cognitive:     Insight: None  Engagement in Group:  None  Modes of Intervention:     Additional Comments:    [name] 07/05/2024, 8:48 PM

## 2024-07-05 NOTE — Plan of Care (Signed)
°  Problem: Health Behavior/Discharge Planning: Goal: Compliance with treatment plan for underlying cause of condition will improve Outcome: Progressing   Problem: Education: Goal: Verbalization of understanding the information provided will improve Outcome: Not Progressing   Problem: Activity: Goal: Interest or engagement in activities will improve Outcome: Not Progressing   Problem: Coping: Goal: Ability to verbalize frustrations and anger appropriately will improve Outcome: Not Progressing

## 2024-07-05 NOTE — Group Note (Signed)
 Date:  07/05/2024 Time:  10:51 AM  Group Topic/Focus:  Healthy Communication:   The focus of this group is to discuss communication, barriers to communication, as well as healthy ways to communicate with others.    Participation Level:  Did Not Attend   [name] Gavel 07/05/2024, 10:51 AM

## 2024-07-05 NOTE — NC FL2 (Signed)
 [place]  MEDICAID FL2 LEVEL OF CARE FORM     IDENTIFICATION  Patient Name: [name] Birthdate: 1961-03-31 Sex: female Admission Date (Current Location): 05/06/2024  [facility] [place] Number:  Chiropodist and Address:  [facility], [address], [place], [place] [address]      Provider Number: [id number]  Attending Physician Name and Address:  Donnelly Mellow, MD  Relative Name and Phone Number:  [name] [facility] APS- [phone], [phone]    Current Level of Care: Hospital Recommended Level of Care: Family Care Home, Other (Comment) (Group Home, Alternative family living) Prior Approval Number:    Date Approved/Denied:   PASRR Number:    Discharge Plan: Other (Comment) (Family care home, Group Home, Alternate family living.)    Current Diagnoses: Patient Active Problem List   Diagnosis Date Noted   Psychosis (HCC) 01/20/2023   Schizoaffective disorder, depressive type (HCC) 04/08/2022   Ventricular tachycardia (HCC) 10/13/2021   Sinus bradycardia 10/03/2021   Anemia of chronic disease 10/02/2021   Hypomagnesemia 09/22/2021   Hypokalemia 09/22/2021   Hypophosphatemia 09/21/2021   Hyponatremia 09/20/2021   Metabolic acidosis 09/16/2021   Protein-calorie malnutrition, severe 03/27/2020   Hypotension 03/26/2020   Anorexia 03/26/2020   Hypokalemia, hypomagnesemia and hypophosphatemia 03/26/2020   Hypocalcemia 03/26/2020   Fall at home, initial encounter 03/26/2020   Recurrent hypoglycemia and severe malnutrition 03/14/2020   Paranoid schizophrenia (HCC) 03/01/2020   Noncompliance with diet and medication regimen    Psychoses (HCC)    Tobacco abuse 03/12/2014   Hypokalemic alkalosis 11/11/2011   Medically noncompliant 11/11/2011   Schizophrenia, paranoid (HCC) 11/07/2011    Orientation RESPIRATION BLADDER Height & Weight     Place, Self, Time  Normal Continent Weight: 44.9 kg Height:  5' 4 (162.6  cm)  BEHAVIORAL SYMPTOMS/MOOD NEUROLOGICAL BOWEL NUTRITION STATUS      Continent Diet  AMBULATORY STATUS COMMUNICATION OF NEEDS Skin   Independent Verbally Normal                       Personal Care Assistance Level of Assistance              Functional Limitations Info             SPECIAL CARE FACTORS FREQUENCY                       Contractures Contractures Info: Not present    Additional Factors Info  Allergies, Psychotropic Code Status Info: Full Allergies Info: Aspirin, Ziprasidone Hcl Psychotropic Info: haloperidol  (HALDOL ) tablet 5 mg, OLANZapine  (ZYPREXA ) tablet 15 mg         Current Medications (07/05/2024):  This is the current hospital active medication list Current Facility-Administered Medications  Medication Dose Route Frequency Provider Last Rate Last Admin   acetaminophen  (TYLENOL ) tablet 650 mg  650 mg Oral Q6H PRN Starkes-Perry, Takia S, FNP       alum & mag hydroxide-simeth (MAALOX/MYLANTA) 200-200-20 MG/5ML suspension 30 mL  30 mL Oral Q4H PRN Starkes-Perry, Takia S, FNP       haloperidol  (HALDOL ) tablet 5 mg  5 mg Oral QHS Jadapalle, Sree, MD       HYDROcodone -acetaminophen  (NORCO/VICODIN) 5-325 MG per tablet 1-2 tablet  1-2 tablet Oral Q4H PRN Starkes-Perry, Takia S, FNP       magnesium  hydroxide (MILK OF MAGNESIA) suspension 30 mL  30 mL Oral Daily PRN Starkes-Perry, Majel RAMAN, FNP  multivitamin with minerals tablet 1 tablet  1 tablet Oral Daily Starkes-Perry, Takia S, FNP   1 tablet at 07/05/24 1055   OLANZapine  (ZYPREXA ) injection 5 mg  5 mg Intramuscular TID PRN Starkes-Perry, Takia S, FNP       OLANZapine  (ZYPREXA ) tablet 15 mg  15 mg Oral BID Jadapalle, Sree, MD   15 mg at 07/05/24 1055   OLANZapine  zydis (ZYPREXA ) disintegrating tablet 5 mg  5 mg Oral TID PRN Starkes-Perry, Takia S, FNP       ondansetron  (ZOFRAN ) tablet 4 mg  4 mg Oral Q6H PRN Starkes-Perry, Majel RAMAN, FNP       Or   ondansetron  (ZOFRAN ) injection 4 mg  4 mg  Intravenous Q6H PRN Starkes-Perry, Majel RAMAN, FNP       thiamine  (VITAMIN B1) tablet 100 mg  100 mg Oral Daily Wilkie Majel RAMAN, FNP   100 mg at 07/05/24 1055   traZODone  (DESYREL ) tablet 50 mg  50 mg Oral QHS Starkes-Perry, Takia S, FNP   50 mg at 07/04/24 2121     Discharge Medications: Please see discharge summary for a list of discharge medications.  Relevant Imaging Results:  Relevant Lab Results:   Additional Information SSN: [id number]  Paula CHRISTELLA Kerns, LCSW

## 2024-07-05 NOTE — Plan of Care (Signed)
°  Problem: Activity: Goal: Interest or engagement in activities will improve Outcome: Not Progressing Goal: Sleeping patterns will improve Outcome: Progressing   Problem: Safety: Goal: Ability to remain free from injury will improve Outcome: Progressing   Problem: Health Behavior/Discharge Planning: Goal: Compliance with prescribed medication regimen will improve Outcome: Not Progressing   Problem: Nutritional: Goal: Ability to achieve adequate nutritional intake will improve Outcome: Progressing

## 2024-07-05 NOTE — Group Note (Signed)
 Date:  07/05/2024 Time:  3:45 PM  Group Topic/Focus:  Emotional Education:   The focus of this group is to discuss what feelings/emotions are, and how they are experienced.    Participation Level:  Did Not Attend   [name] 07/05/2024, 3:45 PM

## 2024-07-05 NOTE — Progress Notes (Signed)
 Phone call back [facility] MD Progress Note  07/05/2024 12:10 PM [name]  MRN:  [id number]  [name] is a 63 y.o. female admitted: Medicallyfor 05/02/2024 10:56 AM for dehydration, failure to thrive and hypoglycemia. She carries the psychiatric diagnoses of Schizophrenia and has medical hx of hypoglycemia and failure to thrive and has a past medical history of sinus bradycardia, chronic anemia, severe malnutrition and recurrent hypoglycemia.. Her current presentation of verbal aggression, agitation and refusal to eat with severe paranoia is most consistent with paranoid schizophrenia. [name]  She was at [facility] where she was admitted on 04/30/2024.  She was transferred to the ED because of failure to thrive and was admitted to medical floor.Patient is on IVC and patient has been adamantly refusing all oral medications and refusing to eat.  Patient is admitted to [facility] unit with Q15 min safety monitoring. Multidisciplinary team approach is offered. Medication management; group/milieu therapy is offered.    Subjective:  Chart reviewed, case discussed in multidisciplinary meeting, patient seen during rounds.   Patient is noted to be visible on the unit mainly during the meals time as she comes out sits in the day area but does not interact with any of the peers.  When provider went to talk to her in her room she remains irritable and refuses to answer any questions related to her mental health.  She offers no complaints and denies having any physical complaints.  She continues to be noted to be mumbling to herself and the daughter is closed.  She is not endorsing SI/HI/plan. Emailed the legal guardian for consent for nonemergent forced medication.  The team is still waiting to get a response to the email Past Psychiatric History: see h&P Family History: History reviewed. No pertinent family history. Social History:  Social History   Substance and Sexual Activity  Alcohol Use Not Currently   Comment:  Refuses to disclose how much     Social History   Substance and Sexual Activity  Drug Use No   Comment: Refuses to answer    Social History   Socioeconomic History   Marital status: Single    Spouse name: Not on file   Number of children: Not on file   Years of education: Not on file   Highest education level: Not on file  Occupational History   Not on file  Tobacco Use   Smoking status: Every Day    Current packs/day: 2.00    Types: Cigarettes    Passive exposure: Current   Smokeless tobacco: Never  Vaping Use   Vaping status: Never Used  Substance and Sexual Activity   Alcohol use: Not Currently    Comment: Refuses to disclose how much   Drug use: No    Comment: Refuses to answer   Sexual activity: Not Currently    Comment: refused to answer  Other Topics Concern   Not on file  Social History Narrative   Not on file   Social Drivers of Health   Tobacco Use: High Risk (05/06/2024)   Patient History    Smoking Tobacco Use: Every Day    Smokeless Tobacco Use: Never    Passive Exposure: Current  Financial Resource Strain: Not on file  Food Insecurity: Patient Declined (05/06/2024)   Epic    Worried About Programme Researcher, Broadcasting/film/video in the Last Year: Patient declined    Barista in the Last Year: Patient declined  Transportation Needs: Patient Declined (05/06/2024)   Epic  Lack of Transportation (Medical): Patient declined    Lack of Transportation (Non-Medical): Patient declined  Physical Activity: Not on file  Stress: Not on file  Social Connections: Not on file  Depression (EYV7-0): Not on file  Alcohol Screen: Low Risk (05/06/2024)   Alcohol Screen    Last Alcohol Screening Score (AUDIT): 0  Housing: Unknown (05/06/2024)   Epic    Unable to Pay for Housing in the Last Year: Patient declined    Number of Times Moved in the Last Year: Not on file    Homeless in the Last Year: Not on file  Utilities: Patient Declined (05/06/2024)   Epic    Threatened  with loss of utilities: Patient declined  Health Literacy: Not on file   Past Medical History:  Past Medical History:  Diagnosis Date   Anemia 10/02/2021   Non compliance w medication regimen    Schizophrenia (HCC)     Past Surgical History:  Procedure Laterality Date   BIOPSY  09/25/2021   Procedure: BIOPSY;  Surgeon: Teressa Toribio SQUIBB, MD;  Location: [facility];  Service: Gastroenterology;;   ESOPHAGOGASTRODUODENOSCOPY (EGD) WITH PROPOFOL  [name] 09/25/2021   Procedure: ESOPHAGOGASTRODUODENOSCOPY (EGD) WITH PROPOFOL ;  Surgeon: Teressa Toribio SQUIBB, MD;  Location: [facility];  Service: Gastroenterology;  Laterality: [name];  With NGT placement    Current Medications: Current Facility-Administered Medications  Medication Dose Route Frequency Provider Last Rate Last Admin   acetaminophen  (TYLENOL ) tablet 650 mg  650 mg Oral Q6H PRN Starkes-Perry, Takia S, FNP       alum & mag hydroxide-simeth (MAALOX/MYLANTA) 200-200-20 MG/5ML suspension 30 mL  30 mL Oral Q4H PRN Starkes-Perry, Takia S, FNP       haloperidol  (HALDOL ) tablet 5 mg  5 mg Oral QHS Lavone Weisel, MD       HYDROcodone -acetaminophen  (NORCO/VICODIN) 5-325 MG per tablet 1-2 tablet  1-2 tablet Oral Q4H PRN Starkes-Perry, Takia S, FNP       magnesium  hydroxide (MILK OF MAGNESIA) suspension 30 mL  30 mL Oral Daily PRN Starkes-Perry, Takia S, FNP       multivitamin with minerals tablet 1 tablet  1 tablet Oral Daily Starkes-Perry, Takia S, FNP   1 tablet at 07/05/24 1055   OLANZapine  (ZYPREXA ) injection 5 mg  5 mg Intramuscular TID PRN Wilkie Majel RAMAN, FNP       OLANZapine  (ZYPREXA ) tablet 15 mg  15 mg Oral BID Elonzo Sopp, MD   15 mg at 07/05/24 1055   OLANZapine  zydis (ZYPREXA ) disintegrating tablet 5 mg  5 mg Oral TID PRN Starkes-Perry, Takia S, FNP       ondansetron  (ZOFRAN ) tablet 4 mg  4 mg Oral Q6H PRN Starkes-Perry, Majel RAMAN, FNP       Or   ondansetron  (ZOFRAN ) injection 4 mg  4 mg Intravenous Q6H PRN Starkes-Perry, Majel RAMAN,  FNP       thiamine  (VITAMIN B1) tablet 100 mg  100 mg Oral Daily Starkes-Perry, Takia S, FNP   100 mg at 07/05/24 1055   traZODone  (DESYREL ) tablet 50 mg  50 mg Oral QHS Starkes-Perry, Takia S, FNP   50 mg at 07/04/24 2121    Lab Results:  No results found for this or any previous visit (from the past 48 hours).   Blood Alcohol level:  Lab Results  Component Value Date   [facility] <15 05/02/2024   ETH <15 05/01/2024    Metabolic Disorder Labs: Lab Results  Component Value Date   HGBA1C 5.6 05/14/2024  MPG 114.02 05/14/2024   MPG 116.89 05/02/2024   Lab Results  Component Value Date   PROLACTIN 13.6 04/16/2015   Lab Results  Component Value Date   CHOL 219 (H) 05/14/2024   TRIG 84 05/14/2024   HDL 83 05/14/2024   CHOLHDL 2.6 05/14/2024   VLDL 17 05/14/2024   LDLCALC 119 (H) 05/14/2024   LDLCALC 130 (H) 05/01/2024    Physical Findings: AIMS:  , ,  ,  ,    CIWA:    COWS:      Psychiatric Specialty Exam:  Presentation  General Appearance:  Casual  Eye Contact: Fair  Speech: Normal rate  Speech Volume: Increased    Mood and Affect  Mood: Angry  Affect: Congruent   Thought Process  Thought Processes: Disorganized  Descriptions of Associations:Tangential  Orientation:Partial  Thought Content:Illogical; Tangential  Hallucinations:RESPONDING TO INTERNAL STIMULI Ideas of Reference:Paranoia; Delusions; Percusatory  Suicidal Thoughts:not endorsing Homicidal Thoughts:nor endorsing  Sensorium  Memory: Recent Poor  Judgment: Poor  Insight: Poor   Executive Functions  Concentration: Poor  Attention Span: Poor  Recall: Poor  Fund of Knowledge: Poor  Language: Poor   Psychomotor Activity  Psychomotor Activity:No data recorded Musculoskeletal: Strength & Muscle Tone: within normal limits Gait & Station: normal Assets  Assets: Resilience    Physical Exam: Physical Exam Vitals and nursing note reviewed.     ROS Blood pressure (!) 113/50, pulse (!) 53, temperature (!) 97.3 F (36.3 C), resp. rate 18, height 5' 4 (1.626 m), weight 44.9 kg, SpO2 100%. Body mass index is 16.99 kg/m.  Diagnosis: Active Problems:   Schizophrenia, paranoid (HCC) Paranoid  PLAN: Safety and Monitoring:  -- Involuntary admission to inpatient psychiatric unit for safety, stabilization and treatment  -- Daily contact with patient to assess and evaluate symptoms and progress in treatment  -- Patient's case to be discussed in multi-disciplinary team meeting  -- Observation Level : q15 minute checks  -- Vital signs:  q12 hours  -- Precautions: suicide, elopement, and assault -- Encouraged patient to participate in unit milieu and in scheduled group therapies  2. Psychiatric Diagnoses and Treatment: Patient  2 physicians approved non emergent forced medications-Dr.Madaram and Dr.Tishanna Dunford  Zyprexa  15 mg BID oral or Im -non emergent forced medication-patient is taking her oral medication Haldol  5 mg bid added -DSS gave consent . Discharge Planning:   -- Social work and case management to assist with discharge planning and identification of hospital follow-up needs prior to discharge  -- Estimated LOS: 3-4 days  Allyn Foil, MD 07/05/2024, 12:10 PM

## 2024-07-05 NOTE — BHH Counselor (Signed)
 CSW contacted Nat Chang [phone]) to touch base and engage in care planning.   Draper reported that she has sent consent to treatment team after being granted ex-parte to start medications just a week ago. With this, Chang has started the process of seeking placement on patient's behalf while the patient gets further stabilized following starting medications.  Draper reported that she believes the patient would be suitable for an alternative family living home, group home or family care home. CSW has sent patient's FL2 to Draper via email to [email or web address] to initiate the placement process.   CSW to continue to assess.   Almarosa Bohac, MSW, LCSWA 07/05/2024 3:28 PM

## 2024-07-05 NOTE — Progress Notes (Signed)
 D- Patient alert and oriented. Pt disoriented to situation. Pt states she is in [place] when asked where she is. Pt presents with a pleasant mood but is slightly irritated when asked assessment questions pertaining to goals and thought process. Pt denies SI, HI, AVH, and pain and states her goal is to get out of herr.  A- Scheduled medications administered to patient, per MD orders. Support and encouragement provided.  Routine safety checks conducted every 15 minutes.  Patient informed to notify staff with problems or concerns.  R- No adverse drug reactions noted. Patient contracts for safety at this time. Patient compliant with medications and treatment plan. Patient receptive, calm, and cooperative. Patient did not interacts with others on the unit except during meal times.  Patient remains safe at this time.    Mirna Sutcliffe S.,RN

## 2024-07-06 MED ORDER — HALOPERIDOL 5 MG PO TABS
5.0000 mg | ORAL_TABLET | Freq: Two times a day (BID) | ORAL | Status: DC
  Administered 2024-07-07 – 2024-07-11 (×10): 5 mg via ORAL
  Filled 2024-07-06 (×11): qty 1

## 2024-07-06 MED ORDER — HALOPERIDOL LACTATE 5 MG/ML IJ SOLN
5.0000 mg | Freq: Two times a day (BID) | INTRAMUSCULAR | Status: DC
  Administered 2024-07-06: 19:00:00 5 mg via INTRAMUSCULAR
  Filled 2024-07-06: qty 1

## 2024-07-06 NOTE — Group Note (Signed)
 Date:  07/06/2024 Time:  4:04 PM  Group Topic/Focus:  Overcoming Stress:   The focus of this group is to define stress and help patients assess their triggers.    Participation Level:  Active  Participation Quality:  Appropriate  Affect:  Appropriate  Cognitive:  Appropriate  Insight: Appropriate  Engagement in Group:  Engaged  Modes of Intervention:  Discussion   [name] 07/06/2024, 4:04 PM

## 2024-07-06 NOTE — Group Note (Signed)
 Recreation Therapy Group Note   Group Topic:Emotion Expression  Group Date: 07/06/2024 Start Time: 1400 End Time: 1435 Facilitators: Celestia Jeoffrey BRAVO, LRT, CTRS Location: Dayroom  Group Description: Expressive Higher Education Careers Adviser. Patients received a blank postcard template. LRT encouraged pt to create a postcard to themselves in their younger days with the knowledge and wisdom they have today. Pts are encouraged to share something positive or words of encouragement on their post cards using colored pencils and markers. Once finished, patients and LRT had a discussion on why they chose the words they did and what it means to them.  Goal Area(s) Addressed: Patient will increase communication skills.  Patient will reminisce a fond memory in their life.   Patient will practice healthy decision making. Patient will express their emotions in a positive way.    Affect/Mood: [name]   Participation Level: Did not attend    Clinical Observations/Individualized Feedback: Patient did not attend.  Plan: Continue to engage patient in RT group sessions 2-3x/week.   Jeoffrey BRAVO Celestia, LRT, CTRS 07/06/2024 4:59 PM

## 2024-07-06 NOTE — Progress Notes (Addendum)
 Patient was observed lying in her room during shift change. Patient is alert and oriented 3, though disoriented to situation. Presents with flat affect and describes mood as okay. Patient denies suicidal ideation, homicidal ideation, auditory or visual hallucinations, self-harm behaviors, and pain.  Patient was compliant with scheduled medications except for scheduled Haloperidol , which she refused, stating, Its not the right medication since its in an orange package. Patient remained calm and cooperative during the assessment but had limited interaction with peers. No unsafe behaviors were observed during this shift.  Safety checks and Q15-minute rounding were completed per unit protocol. Will continue to monitor.   07/06/24 0255  Psych Admission Type (Psych Patients Only)  Admission Status Involuntary  Psychosocial Assessment  Patient Complaints None  Eye Contact Brief  Facial Expression Flat  Affect Flat  Speech Soft  Interaction No initiation  Motor Activity Slow  Appearance/Hygiene Unremarkable;In scrubs  Behavior Characteristics Cooperative;Appropriate to situation;Calm  Mood Pleasant  Thought Process  Coherency WDL  Content UTA  Delusions None reported or observed  Perception UTA  Hallucination None reported or observed  Judgment Impaired  Confusion UTA  Danger to Self  Current suicidal ideation? Denies  Description of Suicide Plan None  Self-Injurious Behavior No self-injurious ideation or behavior indicators observed or expressed   Agreement Not to Harm Self Yes  Description of Agreement Verbal  Danger to Others  Danger to Others None reported or observed

## 2024-07-06 NOTE — Plan of Care (Signed)
  Problem: Education: Goal: Knowledge of [place] [facility] Education information/materials will improve Outcome: Progressing Goal: Emotional status will improve Outcome: Progressing Goal: Mental status will improve Outcome: Progressing Goal: Verbalization of understanding the information provided will improve Outcome: Progressing   Problem: Activity: Goal: Interest or engagement in activities will improve Outcome: Progressing Goal: Sleeping patterns will improve Outcome: Progressing   Problem: Coping: Goal: Ability to verbalize frustrations and anger appropriately will improve Outcome: Progressing Goal: Ability to demonstrate self-control will improve Outcome: Progressing   Problem: Health Behavior/Discharge Planning: Goal: Identification of resources available to assist in meeting health care needs will improve Outcome: Progressing Goal: Compliance with treatment plan for underlying cause of condition will improve Outcome: Progressing   Problem: Physical Regulation: Goal: Ability to maintain clinical measurements within normal limits will improve Outcome: Progressing   Problem: Safety: Goal: Periods of time without injury will increase Outcome: Progressing   Problem: Education: Goal: Knowledge of General Education information will improve Description: Including pain rating scale, medication(s)/side effects and non-pharmacologic comfort measures Outcome: Progressing   Problem: Health Behavior/Discharge Planning: Goal: Ability to manage health-related needs will improve Outcome: Progressing   Problem: Clinical Measurements: Goal: Ability to maintain clinical measurements within normal limits will improve Outcome: Progressing Goal: Will remain free from infection Outcome: Progressing Goal: Diagnostic test results will improve Outcome: Progressing Goal: Respiratory complications will improve Outcome: Progressing Goal: Cardiovascular complication will be  avoided Outcome: Progressing   Problem: Activity: Goal: Risk for activity intolerance will decrease Outcome: Progressing   Problem: Nutrition: Goal: Adequate nutrition will be maintained Outcome: Progressing   Problem: Coping: Goal: Level of anxiety will decrease Outcome: Progressing   Problem: Elimination: Goal: Will not experience complications related to bowel motility Outcome: Progressing Goal: Will not experience complications related to urinary retention Outcome: Progressing   Problem: Pain Managment: Goal: General experience of comfort will improve and/or be controlled Outcome: Progressing   Problem: Safety: Goal: Ability to remain free from injury will improve Outcome: Progressing   Problem: Skin Integrity: Goal: Risk for impaired skin integrity will decrease Outcome: Progressing   Problem: Health Behavior/Discharge Planning: Goal: Compliance with prescribed medication regimen will improve Outcome: Progressing   Problem: Nutritional: Goal: Ability to achieve adequate nutritional intake will improve Outcome: Progressing

## 2024-07-06 NOTE — Plan of Care (Signed)
°  Problem: Education: Goal: Knowledge of [facility] Education information/materials will improve Outcome: Progressing Goal: Emotional status will improve Outcome: Progressing Goal: Mental status will improve Outcome: Progressing Goal: Verbalization of understanding the information provided will improve Outcome: Progressing   Problem: Activity: Goal: Interest or engagement in activities will improve Outcome: Progressing Goal: Sleeping patterns will improve Outcome: Progressing   Problem: Health Behavior/Discharge Planning: Goal: Identification of resources available to assist in meeting health care needs will improve Outcome: Progressing Goal: Compliance with treatment plan for underlying cause of condition will improve Outcome: Progressing   Problem: Coping: Goal: Ability to verbalize frustrations and anger appropriately will improve Outcome: Progressing Goal: Ability to demonstrate self-control will improve Outcome: Progressing   Problem: Safety: Goal: Periods of time without injury will increase Outcome: Progressing   Problem: Education: Goal: Knowledge of General Education information will improve Description: Including pain rating scale, medication(s)/side effects and non-pharmacologic comfort measures Outcome: Progressing

## 2024-07-06 NOTE — Group Note (Signed)
 [facility] LCSW Group Therapy Note   Group Date: 07/06/2024 Start Time: 1300 End Time: 1400   Type of Therapy/Topic:  Group Therapy:  Balance in Life  Participation Level:  Did Not Attend   Description of Group:    This group will address the concept of balance and how it feels and looks when one is unbalanced. Patients will be encouraged to process areas in their lives that are out of balance, and identify reasons for remaining unbalanced. Facilitators will guide patients utilizing problem- solving interventions to address and correct the stressor making their life unbalanced. Understanding and applying boundaries will be explored and addressed for obtaining  and maintaining a balanced life. Patients will be encouraged to explore ways to assertively make their unbalanced needs known to significant others in their lives, using other group members and facilitator for support and feedback.  Therapeutic Goals: Patient will identify two or more emotions or situations they have that consume much of in their lives. Patient will identify signs/triggers that life has become out of balance:  Patient will identify two ways to set boundaries in order to achieve balance in their lives:  Patient will demonstrate ability to communicate their needs through discussion and/or role plays  Summary of Patient Progress:    Patient did not attend.     Therapeutic Modalities:   Cognitive Behavioral Therapy Solution-Focused Therapy Assertiveness Training   Alveta CHRISTELLA Kerns, LCSW

## 2024-07-06 NOTE — Progress Notes (Signed)
 Phone call back [facility] MD Progress Note  07/06/2024 12:30 PM [name]  MRN:  [id number]  [name] is a 63 y.o. female admitted: Medicallyfor 05/02/2024 10:56 AM for dehydration, failure to thrive and hypoglycemia. She carries the psychiatric diagnoses of Schizophrenia and has medical hx of hypoglycemia and failure to thrive and has a past medical history of sinus bradycardia, chronic anemia, severe malnutrition and recurrent hypoglycemia.. Her current presentation of verbal aggression, agitation and refusal to eat with severe paranoia is most consistent with paranoid schizophrenia. [name]  She was at [facility] where she was admitted on 04/30/2024.  She was transferred to the ED because of failure to thrive and was admitted to medical floor.Patient is on IVC and patient has been adamantly refusing all oral medications and refusing to eat.  Patient is admitted to [facility] unit with Q15 min safety monitoring. Multidisciplinary team approach is offered. Medication management; group/milieu therapy is offered.    Subjective:  Chart reviewed, case discussed in multidisciplinary meeting, patient seen during rounds.   On interview patient is noted to be standing in her room.  She will answer the physical complaint questions and denies having any physical problems.  She reports having fair appetite and sleep.  She is noted to be coming out of her room.  When provider tried to talk about guardianship and placement patient got very hostile and said the government will handle that and she needs to go out and refused to answer any questions further. Emailed the legal Tess Chang, [phone] (desk)/ [phone])  for consent for nonemergent forced medication.  Spoke with Ms. Draper on 07/06/2024 and received consent for nonemergent forced medication.  If patient refuses Haldol  oral she will receive IM Haldol . Past Psychiatric History: see h&P Family History: History reviewed. No pertinent family  history. Social History:  Social History   Substance and Sexual Activity  Alcohol Use Not Currently   Comment: Refuses to disclose how much     Social History   Substance and Sexual Activity  Drug Use No   Comment: Refuses to answer    Social History   Socioeconomic History   Marital status: Single    Spouse name: Not on file   Number of children: Not on file   Years of education: Not on file   Highest education level: Not on file  Occupational History   Not on file  Tobacco Use   Smoking status: Every Day    Current packs/day: 2.00    Types: Cigarettes    Passive exposure: Current   Smokeless tobacco: Never  Vaping Use   Vaping status: Never Used  Substance and Sexual Activity   Alcohol use: Not Currently    Comment: Refuses to disclose how much   Drug use: No    Comment: Refuses to answer   Sexual activity: Not Currently    Comment: refused to answer  Other Topics Concern   Not on file  Social History Narrative   Not on file   Social Drivers of Health   Tobacco Use: High Risk (05/06/2024)   Patient History    Smoking Tobacco Use: Every Day    Smokeless Tobacco Use: Never    Passive Exposure: Current  Financial Resource Strain: Not on file  Food Insecurity: Patient Declined (05/06/2024)   Epic    Worried About Programme Researcher, Broadcasting/film/video in the Last Year: Patient declined    Barista in the Last Year: Patient declined  Transportation Needs: Patient Declined (  05/06/2024)   Epic    Lack of Transportation (Medical): Patient declined    Lack of Transportation (Non-Medical): Patient declined  Physical Activity: Not on file  Stress: Not on file  Social Connections: Not on file  Depression (EYV7-0): Not on file  Alcohol Screen: Low Risk (05/06/2024)   Alcohol Screen    Last Alcohol Screening Score (AUDIT): 0  Housing: Unknown (05/06/2024)   Epic    Unable to Pay for Housing in the Last Year: Patient declined    Number of Times Moved in the Last Year: Not  on file    Homeless in the Last Year: Not on file  Utilities: Patient Declined (05/06/2024)   Epic    Threatened with loss of utilities: Patient declined  Health Literacy: Not on file   Past Medical History:  Past Medical History:  Diagnosis Date   Anemia 10/02/2021   Non compliance w medication regimen    Schizophrenia (HCC)     Past Surgical History:  Procedure Laterality Date   BIOPSY  09/25/2021   Procedure: BIOPSY;  Surgeon: Teressa Toribio SQUIBB, MD;  Location: [facility];  Service: Gastroenterology;;   ESOPHAGOGASTRODUODENOSCOPY (EGD) WITH PROPOFOL  [name] 09/25/2021   Procedure: ESOPHAGOGASTRODUODENOSCOPY (EGD) WITH PROPOFOL ;  Surgeon: Teressa Toribio SQUIBB, MD;  Location: [facility];  Service: Gastroenterology;  Laterality: [name];  With NGT placement    Current Medications: Current Facility-Administered Medications  Medication Dose Route Frequency Provider Last Rate Last Admin   acetaminophen  (TYLENOL ) tablet 650 mg  650 mg Oral Q6H PRN Starkes-Perry, Takia S, FNP       alum & mag hydroxide-simeth (MAALOX/MYLANTA) 200-200-20 MG/5ML suspension 30 mL  30 mL Oral Q4H PRN Starkes-Perry, Takia S, FNP       haloperidol  (HALDOL ) tablet 5 mg  5 mg Oral QHS Zaydee Aina, MD       HYDROcodone -acetaminophen  (NORCO/VICODIN) 5-325 MG per tablet 1-2 tablet  1-2 tablet Oral Q4H PRN Starkes-Perry, Takia S, FNP       magnesium  hydroxide (MILK OF MAGNESIA) suspension 30 mL  30 mL Oral Daily PRN Starkes-Perry, Takia S, FNP       multivitamin with minerals tablet 1 tablet  1 tablet Oral Daily Wilkie Majel RAMAN, FNP   1 tablet at 07/06/24 9072   OLANZapine  (ZYPREXA ) injection 5 mg  5 mg Intramuscular TID PRN Wilkie Majel RAMAN, FNP       OLANZapine  (ZYPREXA ) tablet 15 mg  15 mg Oral BID Ranferi Clingan, MD   15 mg at 07/06/24 9072   OLANZapine  zydis (ZYPREXA ) disintegrating tablet 5 mg  5 mg Oral TID PRN Starkes-Perry, Takia S, FNP       ondansetron  (ZOFRAN ) tablet 4 mg  4 mg Oral Q6H PRN  Starkes-Perry, Majel RAMAN, FNP       Or   ondansetron  (ZOFRAN ) injection 4 mg  4 mg Intravenous Q6H PRN Starkes-Perry, Majel RAMAN, FNP       thiamine  (VITAMIN B1) tablet 100 mg  100 mg Oral Daily Wilkie Majel RAMAN, FNP   100 mg at 07/06/24 9072   traZODone  (DESYREL ) tablet 50 mg  50 mg Oral QHS Starkes-Perry, Takia S, FNP   50 mg at 07/05/24 2121    Lab Results:  No results found for this or any previous visit (from the past 48 hours).   Blood Alcohol level:  Lab Results  Component Value Date   [facility] <15 05/02/2024   ETH <15 05/01/2024    Metabolic Disorder Labs: Lab Results  Component Value Date  HGBA1C 5.6 05/14/2024   MPG 114.02 05/14/2024   MPG 116.89 05/02/2024   Lab Results  Component Value Date   PROLACTIN 13.6 04/16/2015   Lab Results  Component Value Date   CHOL 219 (H) 05/14/2024   TRIG 84 05/14/2024   HDL 83 05/14/2024   CHOLHDL 2.6 05/14/2024   VLDL 17 05/14/2024   LDLCALC 119 (H) 05/14/2024   LDLCALC 130 (H) 05/01/2024    Physical Findings: AIMS:  , ,  ,  ,    CIWA:    COWS:      Psychiatric Specialty Exam:  Presentation  General Appearance:  Casual  Eye Contact: Fair  Speech: Normal rate  Speech Volume: Increased    Mood and Affect  Mood: Angry  Affect: Congruent   Thought Process  Thought Processes: Disorganized  Descriptions of Associations:Tangential  Orientation:Partial  Thought Content:Illogical; Tangential  Hallucinations:RESPONDING TO INTERNAL STIMULI Ideas of Reference:Paranoia; Delusions; Percusatory  Suicidal Thoughts:not endorsing Homicidal Thoughts:nor endorsing  Sensorium  Memory: Recent Poor  Judgment: Poor  Insight: Poor   Executive Functions  Concentration: Poor  Attention Span: Poor  Recall: Poor  Fund of Knowledge: Poor  Language: Poor   Psychomotor Activity  Psychomotor Activity:No data recorded Musculoskeletal: Strength & Muscle Tone: within normal limits Gait &  Station: normal Assets  Assets: Resilience    Physical Exam: Physical Exam Vitals and nursing note reviewed.    ROS Blood pressure 110/67, pulse (!) 59, temperature 97.7 F (36.5 C), resp. rate 17, height 5' 4 (1.626 m), weight 44.9 kg, SpO2 100%. Body mass index is 16.99 kg/m.  Diagnosis: Active Problems:   Schizophrenia, paranoid (HCC) Paranoid  PLAN: Safety and Monitoring:  -- Involuntary admission to inpatient psychiatric unit for safety, stabilization and treatment  -- Daily contact with patient to assess and evaluate symptoms and progress in treatment  -- Patient's case to be discussed in multi-disciplinary team meeting  -- Observation Level : q15 minute checks  -- Vital signs:  q12 hours  -- Precautions: suicide, elopement, and assault -- Encouraged patient to participate in unit milieu and in scheduled group therapies  2. Psychiatric Diagnoses and Treatment: Patient  2 physicians approved non emergent forced medications legal guardian gave consent for Haldol  oral or IM  Zyprexa  15 mg BID  Haldol  5 mg bid added -DSS gave consent-nonemergent forced medication consented by legal guardian and patient will receive Haldol  5 mg twice daily IM if she refuses oral . Discharge Planning:   -- Social work and case management to assist with discharge planning and identification of hospital follow-up needs prior to discharge  -- Estimated LOS: 3-4 days  Paula Lucarelli, MD 07/06/2024, 12:30 PM

## 2024-07-07 NOTE — Plan of Care (Signed)
" °  Problem: Education: Goal: Emotional status will improve Outcome: Progressing   Problem: Activity: Goal: Interest or engagement in activities will improve Outcome: Progressing   Problem: Coping: Goal: Ability to verbalize frustrations and anger appropriately will improve Outcome: Progressing Goal: Ability to demonstrate self-control will improve Outcome: Progressing   Problem: Physical Regulation: Goal: Ability to maintain clinical measurements within normal limits will improve Outcome: Progressing   "

## 2024-07-07 NOTE — Group Note (Signed)
 Date:  07/07/2024 Time:  10:43 AM  Group Topic/Focus:  Stages of Change:   The focus of this group is to explain the stages of change and help patients identify changes they want to make upon discharge.    Participation Level:  Did Not Attend  Participation Quality:     Affect:     Cognitive:     Insight: None  Engagement in Group:  None  Modes of Intervention:     Additional Comments:    [name] Bias 07/07/2024, 10:43 AM

## 2024-07-07 NOTE — Plan of Care (Signed)
  Problem: Education: Goal: Knowledge of [place] [facility] Education information/materials will improve Outcome: Progressing Goal: Emotional status will improve Outcome: Progressing Goal: Mental status will improve Outcome: Progressing Goal: Verbalization of understanding the information provided will improve Outcome: Progressing   Problem: Activity: Goal: Interest or engagement in activities will improve Outcome: Progressing Goal: Sleeping patterns will improve Outcome: Progressing   Problem: Coping: Goal: Ability to verbalize frustrations and anger appropriately will improve Outcome: Progressing Goal: Ability to demonstrate self-control will improve Outcome: Progressing   Problem: Health Behavior/Discharge Planning: Goal: Identification of resources available to assist in meeting health care needs will improve Outcome: Progressing Goal: Compliance with treatment plan for underlying cause of condition will improve Outcome: Progressing   Problem: Physical Regulation: Goal: Ability to maintain clinical measurements within normal limits will improve Outcome: Progressing   Problem: Safety: Goal: Periods of time without injury will increase Outcome: Progressing   Problem: Education: Goal: Knowledge of General Education information will improve Description: Including pain rating scale, medication(s)/side effects and non-pharmacologic comfort measures Outcome: Progressing   Problem: Health Behavior/Discharge Planning: Goal: Ability to manage health-related needs will improve Outcome: Progressing   Problem: Clinical Measurements: Goal: Ability to maintain clinical measurements within normal limits will improve Outcome: Progressing Goal: Will remain free from infection Outcome: Progressing Goal: Diagnostic test results will improve Outcome: Progressing Goal: Respiratory complications will improve Outcome: Progressing Goal: Cardiovascular complication will be  avoided Outcome: Progressing   Problem: Activity: Goal: Risk for activity intolerance will decrease Outcome: Progressing   Problem: Nutrition: Goal: Adequate nutrition will be maintained Outcome: Progressing   Problem: Coping: Goal: Level of anxiety will decrease Outcome: Progressing   Problem: Elimination: Goal: Will not experience complications related to bowel motility Outcome: Progressing Goal: Will not experience complications related to urinary retention Outcome: Progressing   Problem: Pain Managment: Goal: General experience of comfort will improve and/or be controlled Outcome: Progressing   Problem: Safety: Goal: Ability to remain free from injury will improve Outcome: Progressing   Problem: Skin Integrity: Goal: Risk for impaired skin integrity will decrease Outcome: Progressing   Problem: Health Behavior/Discharge Planning: Goal: Compliance with prescribed medication regimen will improve Outcome: Progressing   Problem: Nutritional: Goal: Ability to achieve adequate nutritional intake will improve Outcome: Progressing

## 2024-07-07 NOTE — Group Note (Signed)
 Date:  07/07/2024 Time:  10:20 PM  Group Topic/Focus:  Self Care:   The focus of this group is to help patients understand the importance of self-care in order to improve or restore emotional, physical, spiritual, interpersonal, and financial health.    Participation Level:  Did Not Attend  Participation Quality:  Did Not Attend  Affect:  Did Not Attend  Cognitive:  Did Not Attend  Insight: None  Engagement in Group:  None  Modes of Intervention:  Socialization  Additional Comments:    [name] 07/07/2024, 10:20 PM

## 2024-07-07 NOTE — Group Note (Signed)
 Physical/Occupational Therapy Group Note  Group Topic: Pain Management and Coping   Group Date: 07/07/2024 Start Time: 1300 End Time: 1400 Facilitators: Clive Warren CROME, OT   Group Description: Group discussed impact of chronic/acute pain on safety and independence with functional tasks and impact on mental health.  Identified and discussed any previously learned or implemented strategies used.  Discussed and reviewed cognitive behavioral pain coping strategies to address/improve overall management of pain. Discussed relaxation, distraction techniques, cognitive restructuring, activity pacing/energy conservation, environment/home safety modifications, and role of sleep and sleep hygiene. Allowed time for questions and further discussion.  Therapeutic Goal(s):  Identify and discuss previously utilized pain coping strategies and implications of pain on function/well-being Identify and discuss implementing new cognitive behavioral pain coping strategies into daily routines Demonstrate understanding and performance of learned cognitive behavioral pain coping strategies  Individual Participation: Pt did not attend.  Participation Level: Did not attend   Participation Quality:   Behavior:   Speech/Thought Process:   Affect/Mood:   Insight:   Judgement:   Modes of Intervention:   Patient Response to Interventions:    Plan: Continue to engage patient in PT/OT groups 1 - 2x/week.  London Tarnowski R., MPH, MS, OTR/L ascom [phone] 07/07/2024, 2:48 PM

## 2024-07-07 NOTE — Progress Notes (Signed)
" °   07/06/24 2000  Psych Admission Type (Psych Patients Only)  Admission Status Involuntary  Psychosocial Assessment  Patient Complaints None  Eye Contact Brief  Facial Expression Flat  Affect Appropriate to circumstance  Speech Soft  Interaction Isolative;Poor  Motor Activity Other (Comment) (WNL)  Appearance/Hygiene Unremarkable  Behavior Characteristics Appropriate to situation  Mood Preoccupied  Thought Process  Coherency WDL  Content Preoccupation  Delusions WDL  Perception WDL  Hallucination None reported or observed  Judgment Impaired  Confusion WDL  Danger to Self  Current suicidal ideation? Denies  Self-Injurious Behavior No self-injurious ideation or behavior indicators observed or expressed   Agreement Not to Harm Self Yes  Description of Agreement verbal  Danger to Others  Danger to Others None reported or observed   Mood/Behavior:  Irritable. Preoccupied.  Psych assessment: Denies SI/HI and AVH.     Interaction / Group attendance: Isolative to self. Minimal interaction with peers and staff.  Did not attend group.   Medication/ PRNs: Non-compliant with scheduled po Haldol  and required IM injection. Pt compliant with I'm responding give it to me. No prns required.    Pain: Denies    15 min checks in place for safety. "

## 2024-07-07 NOTE — Plan of Care (Signed)
 " Problem: Education: Goal: Knowledge of [facility] Education information/materials will improve 07/07/2024 1944 by Geneva Steward CROME, RN Outcome: Progressing 07/07/2024 0637 by Geneva Steward CROME, RN Outcome: Progressing Goal: Emotional status will improve 07/07/2024 1944 by Geneva Steward CROME, RN Outcome: Progressing 07/07/2024 0637 by Geneva Steward CROME, RN Outcome: Progressing Goal: Mental status will improve 07/07/2024 1944 by Geneva Steward CROME, RN Outcome: Progressing 07/07/2024 0637 by Geneva Steward CROME, RN Outcome: Progressing Goal: Verbalization of understanding the information provided will improve 07/07/2024 1944 by Geneva Steward CROME, RN Outcome: Progressing 07/07/2024 0637 by Geneva Steward CROME, RN Outcome: Progressing   Problem: Activity: Goal: Interest or engagement in activities will improve 07/07/2024 1944 by Geneva Steward CROME, RN Outcome: Progressing 07/07/2024 0637 by Geneva Steward CROME, RN Outcome: Progressing Goal: Sleeping patterns will improve 07/07/2024 1944 by Geneva Steward CROME, RN Outcome: Progressing 07/07/2024 0637 by Geneva Steward CROME, RN Outcome: Progressing   Problem: Coping: Goal: Ability to verbalize frustrations and anger appropriately will improve 07/07/2024 1944 by Geneva Steward CROME, RN Outcome: Progressing 07/07/2024 0637 by Geneva Steward CROME, RN Outcome: Progressing Goal: Ability to demonstrate self-control will improve 07/07/2024 1944 by Geneva Steward CROME, RN Outcome: Progressing 07/07/2024 0637 by Geneva Steward CROME, RN Outcome: Progressing   Problem: Health Behavior/Discharge Planning: Goal: Identification of resources available to assist in meeting health care needs will improve 07/07/2024 1944 by Geneva Steward CROME, RN Outcome: Progressing 07/07/2024 0637 by Geneva Steward CROME, RN Outcome: Progressing Goal: Compliance with treatment plan for underlying cause of condition will improve 07/07/2024 1944 by Geneva Steward CROME, RN Outcome: Progressing 07/07/2024  0637 by Geneva Steward CROME, RN Outcome: Progressing   Problem: Physical Regulation: Goal: Ability to maintain clinical measurements within normal limits will improve 07/07/2024 1944 by Geneva Steward CROME, RN Outcome: Progressing 07/07/2024 0637 by Geneva Steward CROME, RN Outcome: Progressing   Problem: Safety: Goal: Periods of time without injury will increase 07/07/2024 1944 by Geneva Steward CROME, RN Outcome: Progressing 07/07/2024 0637 by Geneva Steward CROME, RN Outcome: Progressing   Problem: Education: Goal: Knowledge of General Education information will improve Description: Including pain rating scale, medication(s)/side effects and non-pharmacologic comfort measures 07/07/2024 1944 by Geneva Steward CROME, RN Outcome: Progressing 07/07/2024 0637 by Geneva Steward CROME, RN Outcome: Progressing   Problem: Health Behavior/Discharge Planning: Goal: Ability to manage health-related needs will improve 07/07/2024 1944 by Geneva Steward CROME, RN Outcome: Progressing 07/07/2024 0637 by Geneva Steward CROME, RN Outcome: Progressing   Problem: Clinical Measurements: Goal: Ability to maintain clinical measurements within normal limits will improve 07/07/2024 1944 by Geneva Steward CROME, RN Outcome: Progressing 07/07/2024 0637 by Geneva Steward CROME, RN Outcome: Progressing Goal: Will remain free from infection 07/07/2024 1944 by Geneva Steward CROME, RN Outcome: Progressing 07/07/2024 0637 by Geneva Steward CROME, RN Outcome: Progressing Goal: Diagnostic test results will improve 07/07/2024 1944 by Geneva Steward CROME, RN Outcome: Progressing 07/07/2024 0637 by Geneva Steward CROME, RN Outcome: Progressing Goal: Respiratory complications will improve 07/07/2024 1944 by Geneva Steward CROME, RN Outcome: Progressing 07/07/2024 0637 by Geneva Steward CROME, RN Outcome: Progressing Goal: Cardiovascular complication will be avoided 07/07/2024 1944 by Geneva Steward CROME, RN Outcome: Progressing 07/07/2024 0637 by Geneva Steward CROME, RN Outcome:  Progressing   Problem: Activity: Goal: Risk for activity intolerance will decrease 07/07/2024 1944 by Geneva Steward CROME, RN Outcome: Progressing 07/07/2024 0637 by Geneva Steward CROME, RN Outcome: Progressing   Problem: Nutrition: Goal: Adequate nutrition will be maintained 07/07/2024 1944 by Geneva Steward CROME, RN Outcome: Progressing 07/07/2024 [phone]  by Geneva Steward CROME, RN Outcome: Progressing   Problem: Coping: Goal: Level of anxiety will decrease 07/07/2024 1944 by Geneva Steward CROME, RN Outcome: Progressing 07/07/2024 0637 by Geneva Steward CROME, RN Outcome: Progressing   Problem: Elimination: Goal: Will not experience complications related to bowel motility 07/07/2024 1944 by Geneva Steward CROME, RN Outcome: Progressing 07/07/2024 0637 by Geneva Steward CROME, RN Outcome: Progressing Goal: Will not experience complications related to urinary retention 07/07/2024 1944 by Geneva Steward CROME, RN Outcome: Progressing 07/07/2024 0637 by Geneva Steward CROME, RN Outcome: Progressing   Problem: Pain Managment: Goal: General experience of comfort will improve and/or be controlled 07/07/2024 1944 by Geneva Steward CROME, RN Outcome: Progressing 07/07/2024 0637 by Geneva Steward CROME, RN Outcome: Progressing   Problem: Safety: Goal: Ability to remain free from injury will improve 07/07/2024 1944 by Geneva Steward CROME, RN Outcome: Progressing 07/07/2024 0637 by Geneva Steward CROME, RN Outcome: Progressing   Problem: Skin Integrity: Goal: Risk for impaired skin integrity will decrease 07/07/2024 1944 by Geneva Steward CROME, RN Outcome: Progressing 07/07/2024 0637 by Geneva Steward CROME, RN Outcome: Progressing   Problem: Health Behavior/Discharge Planning: Goal: Compliance with prescribed medication regimen will improve 07/07/2024 1944 by Geneva Steward CROME, RN Outcome: Progressing 07/07/2024 0637 by Geneva Steward CROME, RN Outcome: Progressing   Problem: Nutritional: Goal: Ability to achieve adequate nutritional intake  will improve 07/07/2024 1944 by Geneva Steward CROME, RN Outcome: Progressing 07/07/2024 0637 by Geneva Steward CROME, RN Outcome: Progressing   "

## 2024-07-07 NOTE — Progress Notes (Signed)
 Patient is compliant to PO medications this shift stated  I want to go home I am being held against her will. She required encouragement for medication and was compliant. No medications adverse effects noted. Patient denies SI/HI/A/VH and verbally contracts for safety. Patient is heard talking to herself in the room. Support ongoing.

## 2024-07-07 NOTE — Group Note (Signed)
 Date:  07/07/2024 Time:  3:32 PM  Group Topic/Focus:  Movement Therapy, Getting fit with Raniyah Curenton.    Participation Level:  Did Not Attend    [name] Bias 07/07/2024, 3:32 PM

## 2024-07-07 NOTE — Group Note (Signed)
 Recreation Therapy Group Note   Group Topic:Communication  Group Date: 07/07/2024 Start Time: 1405 End Time: 1440 Facilitators: Celestia Jeoffrey BRAVO, LRT, CTRS Location: Courtyard  Group Description: Music. Patients are encouraged to name their favorite song(s) for LRT to play song through speaker for group to hear, while in the courtyard getting fresh air and sunlight. Patients educated on the definition of leisure and the importance of having different leisure interests outside of the hospital. Group discussed how leisure activities can often be used as pharmacologist and that listening to music and being outside are examples.    Goal Area(s) Addressed:  Patient will identify a current leisure interest.  Patient will practice making a positive decision. Patient will have the opportunity to try a new leisure activity.   Affect/Mood: [name]   Participation Level: Did not attend    Clinical Observations/Individualized Feedback: Patient did not attend.  Plan: Continue to engage patient in RT group sessions 2-3x/week.   Jeoffrey BRAVO Celestia, LRT, CTRS 07/07/2024 4:48 PM

## 2024-07-07 NOTE — Progress Notes (Signed)
 Phone call back [facility] MD Progress Note  07/07/2024 12:24 PM [name]  MRN:  [id number]  [name] is a 63 y.o. female admitted: Medicallyfor 05/02/2024 10:56 AM for dehydration, failure to thrive and hypoglycemia. She carries the psychiatric diagnoses of Schizophrenia and has medical hx of hypoglycemia and failure to thrive and has a past medical history of sinus bradycardia, chronic anemia, severe malnutrition and recurrent hypoglycemia.. Her current presentation of verbal aggression, agitation and refusal to eat with severe paranoia is most consistent with paranoid schizophrenia. [name]  She was at [facility] where she was admitted on 04/30/2024.  She was transferred to the ED because of failure to thrive and was admitted to medical floor.Patient is on IVC and patient has been adamantly refusing all oral medications and refusing to eat.  Patient is admitted to [facility] unit with Q15 min safety monitoring. Multidisciplinary team approach is offered. Medication management; group/milieu therapy is offered.    Subjective:  Chart reviewed, case discussed in multidisciplinary meeting, patient seen during rounds.    Today on interview patient is noted to be standing in her room.  She is noted to be responding to internal stimuli and mumbling to herself.  She offers no complaints about her physical health.  She remains very superficial and paranoid to disclose any information about her mental health including hallucinations/depression/SI/HI.  Per nursing patient comes out of the room only to eat and stays in her room most of the day.  Discussed with the treatment team about an forced medication of Haldol  approved by the legal guardian. Emailed the legal Tess Chang, [phone] (desk)/ [phone])  for consent for nonemergent forced medication.  Spoke with Ms. Draper on 07/06/2024 and received consent for nonemergent forced medication.  If patient refuses Haldol  oral she will receive IM  Haldol . Past Psychiatric History: see h&P Family History: History reviewed. No pertinent family history. Social History:  Social History   Substance and Sexual Activity  Alcohol Use Not Currently   Comment: Refuses to disclose how much     Social History   Substance and Sexual Activity  Drug Use No   Comment: Refuses to answer    Social History   Socioeconomic History   Marital status: Single    Spouse name: Not on file   Number of children: Not on file   Years of education: Not on file   Highest education level: Not on file  Occupational History   Not on file  Tobacco Use   Smoking status: Every Day    Current packs/day: 2.00    Types: Cigarettes    Passive exposure: Current   Smokeless tobacco: Never  Vaping Use   Vaping status: Never Used  Substance and Sexual Activity   Alcohol use: Not Currently    Comment: Refuses to disclose how much   Drug use: No    Comment: Refuses to answer   Sexual activity: Not Currently    Comment: refused to answer  Other Topics Concern   Not on file  Social History Narrative   Not on file   Social Drivers of Health   Tobacco Use: High Risk (05/06/2024)   Patient History    Smoking Tobacco Use: Every Day    Smokeless Tobacco Use: Never    Passive Exposure: Current  Financial Resource Strain: Not on file  Food Insecurity: Patient Declined (05/06/2024)   Epic    Worried About Programme Researcher, Broadcasting/film/video in the Last Year: Patient declined    [employer]chant [employer] Officer  of Food in the Last Year: Patient declined  Transportation Needs: Patient Declined (05/06/2024)   Epic    Lack of Transportation (Medical): Patient declined    Lack of Transportation (Non-Medical): Patient declined  Physical Activity: Not on file  Stress: Not on file  Social Connections: Not on file  Depression (EYV7-0): Not on file  Alcohol Screen: Low Risk (05/06/2024)   Alcohol Screen    Last Alcohol Screening Score (AUDIT): 0  Housing: Unknown (05/06/2024)   Epic    Unable to  Pay for Housing in the Last Year: Patient declined    Number of Times Moved in the Last Year: Not on file    Homeless in the Last Year: Not on file  Utilities: Patient Declined (05/06/2024)   Epic    Threatened with loss of utilities: Patient declined  Health Literacy: Not on file   Past Medical History:  Past Medical History:  Diagnosis Date   Anemia 10/02/2021   Non compliance w medication regimen    Schizophrenia (HCC)     Past Surgical History:  Procedure Laterality Date   BIOPSY  09/25/2021   Procedure: BIOPSY;  Surgeon: Teressa Toribio SQUIBB, MD;  Location: [facility];  Service: Gastroenterology;;   ESOPHAGOGASTRODUODENOSCOPY (EGD) WITH PROPOFOL  [name] 09/25/2021   Procedure: ESOPHAGOGASTRODUODENOSCOPY (EGD) WITH PROPOFOL ;  Surgeon: Teressa Toribio SQUIBB, MD;  Location: [facility];  Service: Gastroenterology;  Laterality: [name];  With NGT placement    Current Medications: Current Facility-Administered Medications  Medication Dose Route Frequency Provider Last Rate Last Admin   acetaminophen  (TYLENOL ) tablet 650 mg  650 mg Oral Q6H PRN Starkes-Perry, Takia S, FNP       alum & mag hydroxide-simeth (MAALOX/MYLANTA) 200-200-20 MG/5ML suspension 30 mL  30 mL Oral Q4H PRN Starkes-Perry, Takia S, FNP       haloperidol  (HALDOL ) tablet 5 mg  5 mg Oral BID Gaylen Pereira, MD   5 mg at 07/07/24 9061   Or   haloperidol  lactate (HALDOL ) injection 5 mg  5 mg Intramuscular BID Erna Brossard, MD   5 mg at 07/06/24 1922   HYDROcodone -acetaminophen  (NORCO/VICODIN) 5-325 MG per tablet 1-2 tablet  1-2 tablet Oral Q4H PRN Starkes-Perry, Takia S, FNP       magnesium  hydroxide (MILK OF MAGNESIA) suspension 30 mL  30 mL Oral Daily PRN Starkes-Perry, Takia S, FNP       multivitamin with minerals tablet 1 tablet  1 tablet Oral Daily Wilkie Majel RAMAN, FNP   1 tablet at 07/07/24 9061   OLANZapine  (ZYPREXA ) injection 5 mg  5 mg Intramuscular TID PRN Wilkie Majel RAMAN, FNP       OLANZapine  (ZYPREXA ) tablet  15 mg  15 mg Oral BID Josceline Chenard, MD   15 mg at 07/07/24 9061   OLANZapine  zydis (ZYPREXA ) disintegrating tablet 5 mg  5 mg Oral TID PRN Starkes-Perry, Takia S, FNP       ondansetron  (ZOFRAN ) tablet 4 mg  4 mg Oral Q6H PRN Starkes-Perry, Majel RAMAN, FNP       Or   ondansetron  (ZOFRAN ) injection 4 mg  4 mg Intravenous Q6H PRN Starkes-Perry, Majel RAMAN, FNP       thiamine  (VITAMIN B1) tablet 100 mg  100 mg Oral Daily Starkes-Perry, Takia S, FNP   100 mg at 07/07/24 9061   traZODone  (DESYREL ) tablet 50 mg  50 mg Oral QHS Starkes-Perry, Takia S, FNP   50 mg at 07/06/24 2123    Lab Results:  No results found for this or  any previous visit (from the past 48 hours).   Blood Alcohol level:  Lab Results  Component Value Date   [facility] <15 05/02/2024   ETH <15 05/01/2024    Metabolic Disorder Labs: Lab Results  Component Value Date   HGBA1C 5.6 05/14/2024   MPG 114.02 05/14/2024   MPG 116.89 05/02/2024   Lab Results  Component Value Date   PROLACTIN 13.6 04/16/2015   Lab Results  Component Value Date   CHOL 219 (H) 05/14/2024   TRIG 84 05/14/2024   HDL 83 05/14/2024   CHOLHDL 2.6 05/14/2024   VLDL 17 05/14/2024   LDLCALC 119 (H) 05/14/2024   LDLCALC 130 (H) 05/01/2024    Physical Findings: AIMS:  , ,  ,  ,    CIWA:    COWS:      Psychiatric Specialty Exam:  Presentation  General Appearance:  Casual  Eye Contact: Fair  Speech: Normal rate  Speech Volume: Increased    Mood and Affect  Mood: Angry  Affect: Congruent   Thought Process  Thought Processes: Disorganized  Descriptions of Associations:Tangential  Orientation:Partial  Thought Content:Illogical; Tangential  Hallucinations:RESPONDING TO INTERNAL STIMULI Ideas of Reference:Paranoia; Delusions; Percusatory  Suicidal Thoughts:not endorsing Homicidal Thoughts:nor endorsing  Sensorium  Memory: Recent Poor  Judgment: Poor  Insight: Poor   Executive Functions   Concentration: Poor  Attention Span: Poor  Recall: Poor  Fund of Knowledge: Poor  Language: Poor   Psychomotor Activity  Psychomotor Activity:No data recorded Musculoskeletal: Strength & Muscle Tone: within normal limits Gait & Station: normal Assets  Assets: Resilience    Physical Exam: Physical Exam Vitals and nursing note reviewed.    ROS Blood pressure (!) 93/58, pulse 66, temperature 98.2 F (36.8 C), resp. rate 18, height 5' 4 (1.626 m), weight 44.9 kg, SpO2 99%. Body mass index is 16.99 kg/m.  Diagnosis: Active Problems:   Schizophrenia, paranoid (HCC) Paranoid  PLAN: Safety and Monitoring:  -- Involuntary admission to inpatient psychiatric unit for safety, stabilization and treatment  -- Daily contact with patient to assess and evaluate symptoms and progress in treatment  -- Patient's case to be discussed in multi-disciplinary team meeting  -- Observation Level : q15 minute checks  -- Vital signs:  q12 hours  -- Precautions: suicide, elopement, and assault -- Encouraged patient to participate in unit milieu and in scheduled group therapies  2. Psychiatric Diagnoses and Treatment: Patient  2 physicians approved non emergent forced medications legal guardian gave consent for Haldol  oral or IM  Zyprexa  15 mg BID  Haldol  5 mg bid added -DSS gave consent-nonemergent forced medication consented by legal guardian and patient will receive Haldol  5 mg twice daily IM if she refuses oral . Discharge Planning:   -- Social work and case management to assist with discharge planning and identification of hospital follow-up needs prior to discharge  -- Estimated LOS: 3-4 days  Allyn Foil, MD 07/07/2024, 12:24 PM

## 2024-07-08 NOTE — Progress Notes (Signed)
" °   07/08/24 2030  Psych Admission Type (Psych Patients Only)  Admission Status Involuntary  Psychosocial Assessment  Patient Complaints None  Eye Contact Avoids  Facial Expression Flat  Affect Appropriate to circumstance  Speech Soft  Interaction Isolative;Poor  Motor Activity Other (Comment) (WNL)  Appearance/Hygiene In scrubs  Behavior Characteristics Cooperative;Appropriate to situation;Calm  Mood Preoccupied  Thought Process  Coherency WDL  Content Preoccupation  Delusions None reported or observed  Perception WDL  Hallucination None reported or observed  Judgment Limited  Confusion Mild  Danger to Self  Current suicidal ideation? Denies  Self-Injurious Behavior No self-injurious ideation or behavior indicators observed or expressed   Agreement Not to Harm Self Yes  Description of Agreement Verbal  Danger to Others  Danger to Others None reported or observed    "

## 2024-07-08 NOTE — Plan of Care (Signed)
" °  Problem: Coping: Goal: Ability to demonstrate self-control will improve Outcome: Progressing   Problem: Education: Goal: Verbalization of understanding the information provided will improve Outcome: Not Progressing   Problem: Activity: Goal: Interest or engagement in activities will improve Outcome: Not Progressing   "

## 2024-07-08 NOTE — Plan of Care (Signed)
  Problem: Activity: Goal: Interest or engagement in activities will improve Outcome: Not Progressing   Problem: Safety: Goal: Periods of time without injury will increase Outcome: Progressing

## 2024-07-08 NOTE — Group Note (Signed)
 Date:  07/08/2024 Time:  4:28 PM  Group Topic/Focus:  Managing Feelings:   The focus of this group is to identify what feelings patients have difficulty handling and develop a plan to handle them in a healthier way upon discharge.    Participation Level:  Did Not Attend   [name] 07/08/2024, 4:28 PM

## 2024-07-08 NOTE — Group Note (Signed)
 Date:  07/08/2024 Time:  10:29 AM  Group Topic/Focus:  Movement Therapy Morning strech with Emoni Yang    Participation Level:  Did Not Attend    [name] Bias 07/08/2024, 10:29 AM

## 2024-07-08 NOTE — Progress Notes (Signed)
 Behavior:  Cooperative.   Psych assessment:  Denies SI. Irritated about having to take Haldol , but compliant.   Interaction / Group attendance:  Isolates to room.  No interaction with peers. Minimal interaction with staff.   Medication/ PRNs: Compliant with PO medications.  Pain: Denies.   15 min checks in place for safety.

## 2024-07-08 NOTE — Group Note (Signed)
"                                                 [facility] LCSW Group Therapy Note    Group Date: 07/08/2024 Start Time: 1328 End Time: 1358  Type of Therapy and Topic:  Group Therapy:  Overcoming Obstacles  Participation Level:  [facility] PARTICIPATION LEVEL: Did Not Attend  Mood: Not able to access.  Pt did not attend group  Description of Group:   In this group patients will be encouraged to explore what they see as obstacles to their own wellness and recovery. They will be guided to discuss their thoughts, feelings, and behaviors related to these obstacles. The group will process together ways to cope with barriers, with attention given to specific choices patients can make. Each patient will be challenged to identify changes they are motivated to make in order to overcome their obstacles. This group will be process-oriented, with patients participating in exploration of their own experiences as well as giving and receiving support and challenge from other group members.  Therapeutic Goals: 1. Patient will identify personal and current obstacles as they relate to admission. 2. Patient will identify barriers that currently interfere with their wellness or overcoming obstacles.  3. Patient will identify feelings, thought process and behaviors related to these barriers. 4. Patient will identify two changes they are willing to make to overcome these obstacles:    Summary of Patient Progress  Pt did not participate in group.    Therapeutic Modalities:   Cognitive Behavioral Therapy Solution Focused Therapy Motivational Interviewing Relapse Prevention Therapy   Rexene LELON Mae, LCSWA "

## 2024-07-08 NOTE — Progress Notes (Signed)
" °   07/07/24 2000  Psych Admission Type (Psych Patients Only)  Admission Status Involuntary  Psychosocial Assessment  Patient Complaints Sadness  Eye Contact Brief  Facial Expression Flat  Affect Appropriate to circumstance  Speech Soft  Interaction Isolative;Poor  Motor Activity Other (Comment) (WNL)  Appearance/Hygiene Unremarkable  Behavior Characteristics Appropriate to situation  Mood Preoccupied  Thought Process  Coherency WDL  Content Preoccupation  Delusions None reported or observed  Perception WDL  Hallucination None reported or observed  Judgment Impaired  Confusion WDL  Danger to Self  Current suicidal ideation? Denies  Self-Injurious Behavior No self-injurious ideation or behavior indicators observed or expressed   Agreement Not to Harm Self Yes  Description of Agreement verbal  Danger to Others  Danger to Others None reported or observed   Mood/Behavior: Calm and cooperative.   Psych assessment: Denies SI/HI and AVH.     Interaction / Group attendance: Isolative to self. Minimal interaction with peers and staff.  Did not attend group.   Medication/ PRNs: Compliant with scheduled meds. No prns required.     Pain: Denies     15 min checks in place for safety. "

## 2024-07-08 NOTE — Group Note (Signed)
 Date:  07/08/2024 Time:  10:17 PM  Group Topic/Focus:  Identifying Needs:   The focus of this group is to help patients identify their personal needs that have been historically problematic and identify healthy behaviors to address their needs.    Participation Level:  Did Not Attend  Participation Quality:  Did Not Attend  Affect:  Did Not Attend  Cognitive:  Did Not Attend  Insight: None  Engagement in Group:  None  Modes of Intervention:  Support  Additional Comments:    [name] 07/08/2024, 10:17 PM

## 2024-07-09 NOTE — Plan of Care (Signed)
  Problem: Activity: Goal: Interest or engagement in activities will improve Outcome: Progressing   Problem: Coping: Goal: Ability to demonstrate self-control will improve Outcome: Progressing   Problem: Health Behavior/Discharge Planning: Goal: Compliance with treatment plan for underlying cause of condition will improve Outcome: Progressing   

## 2024-07-09 NOTE — BH IP Treatment Plan (Signed)
 Interdisciplinary Treatment and Diagnostic Plan Update  07/09/2024 Time of Session: 10:49am [name] MRN: [id number]  Principal Diagnosis: <principal problem not specified>  Secondary Diagnoses: Active Problems:   Schizophrenia, paranoid (HCC)   Current Medications:  Current Facility-Administered Medications  Medication Dose Route Frequency Provider Last Rate Last Admin   acetaminophen  (TYLENOL ) tablet 650 mg  650 mg Oral Q6H PRN Starkes-Perry, Takia S, FNP       alum & mag hydroxide-simeth (MAALOX/MYLANTA) 200-200-20 MG/5ML suspension 30 mL  30 mL Oral Q4H PRN Starkes-Perry, Takia S, FNP       haloperidol  (HALDOL ) tablet 5 mg  5 mg Oral BID Jadapalle, Sree, MD   5 mg at 07/09/24 9079   Or   haloperidol  lactate (HALDOL ) injection 5 mg  5 mg Intramuscular BID Jadapalle, Sree, MD   5 mg at 07/06/24 1922   HYDROcodone -acetaminophen  (NORCO/VICODIN) 5-325 MG per tablet 1-2 tablet  1-2 tablet Oral Q4H PRN Starkes-Perry, Takia S, FNP       magnesium  hydroxide (MILK OF MAGNESIA) suspension 30 mL  30 mL Oral Daily PRN Starkes-Perry, Takia S, FNP       multivitamin with minerals tablet 1 tablet  1 tablet Oral Daily Wilkie Majel RAMAN, FNP   1 tablet at 07/09/24 0920   OLANZapine  (ZYPREXA ) injection 5 mg  5 mg Intramuscular TID PRN Wilkie Majel RAMAN, FNP       OLANZapine  (ZYPREXA ) tablet 15 mg  15 mg Oral BID Jadapalle, Sree, MD   15 mg at 07/09/24 0919   OLANZapine  zydis (ZYPREXA ) disintegrating tablet 5 mg  5 mg Oral TID PRN Starkes-Perry, Takia S, FNP       ondansetron  (ZOFRAN ) tablet 4 mg  4 mg Oral Q6H PRN Starkes-Perry, Majel RAMAN, FNP       Or   ondansetron  (ZOFRAN ) injection 4 mg  4 mg Intravenous Q6H PRN Starkes-Perry, Takia S, FNP       thiamine  (VITAMIN B1) tablet 100 mg  100 mg Oral Daily Wilkie Majel RAMAN, FNP   100 mg at 07/09/24 0920   traZODone  (DESYREL ) tablet 50 mg  50 mg Oral QHS Starkes-Perry, Takia S, FNP   50 mg at 07/08/24 2123   PTA Medications: Medications  Prior to Admission  Medication Sig Dispense Refill Last Dose/Taking   feeding supplement (ENSURE PLUS HIGH PROTEIN) LIQD Take 237 mLs by mouth 3 (three) times daily between meals.      Multiple Vitamin (MULTIVITAMIN WITH MINERALS) TABS tablet Take 1 tablet by mouth daily.      OLANZapine  (ZYPREXA ) 5 MG tablet Take 1 tablet (5 mg total) by mouth 2 (two) times daily.      OLANZapine  (ZYPREXA ) injection Inject 1 mL (5 mg total) into the muscle 2 (two) times daily.      thiamine  (VITAMIN B-1) 100 MG tablet Take 1 tablet (100 mg total) by mouth daily.      traZODone  (DESYREL ) 50 MG tablet Take 1 tablet (50 mg total) by mouth at bedtime.       Patient Stressors:    Patient Strengths:    Treatment Modalities: Medication Management, Group therapy, Case management,  1 to 1 session with clinician, Psychoeducation, Recreational therapy.   Physician Treatment Plan for Primary Diagnosis: <principal problem not specified> Long Term Goal(s): Improvement in symptoms so as ready for discharge   Short Term Goals: Ability to identify changes in lifestyle to reduce recurrence of condition will improve  Medication Management: Evaluate patient's response, side effects, and tolerance of medication  regimen.  Therapeutic Interventions: 1 to 1 sessions, Unit Group sessions and Medication administration.  Evaluation of Outcomes: Progressing  Physician Treatment Plan for Secondary Diagnosis: Active Problems:   Schizophrenia, paranoid (HCC)  Long Term Goal(s): Improvement in symptoms so as ready for discharge   Short Term Goals: Ability to identify changes in lifestyle to reduce recurrence of condition will improve     Medication Management: Evaluate patient's response, side effects, and tolerance of medication regimen.  Therapeutic Interventions: 1 to 1 sessions, Unit Group sessions and Medication administration.  Evaluation of Outcomes: Progressing   RN Treatment Plan for Primary Diagnosis:  <principal problem not specified> Long Term Goal(s): Knowledge of disease and therapeutic regimen to maintain health will improve  Short Term Goals: Ability to remain free from injury will improve, Ability to verbalize frustration and anger appropriately will improve, Ability to demonstrate self-control, Ability to participate in decision making will improve, Ability to verbalize feelings will improve, Ability to disclose and discuss suicidal ideas, Ability to identify and develop effective coping behaviors will improve, and Compliance with prescribed medications will improve  Medication Management: RN will administer medications as ordered by provider, will assess and evaluate patient's response and provide education to patient for prescribed medication. RN will report any adverse and/or side effects to prescribing provider.  Therapeutic Interventions: 1 on 1 counseling sessions, Psychoeducation, Medication administration, Evaluate responses to treatment, Monitor vital signs and CBGs as ordered, Perform/monitor CIWA, COWS, AIMS and Fall Risk screenings as ordered, Perform wound care treatments as ordered.  Evaluation of Outcomes: Progressing   LCSW Treatment Plan for Primary Diagnosis: <principal problem not specified> Long Term Goal(s): Safe transition to appropriate next level of care at discharge, Engage patient in therapeutic group addressing interpersonal concerns.  Short Term Goals: Engage patient in aftercare planning with referrals and resources, Increase social support, Increase ability to appropriately verbalize feelings, Increase emotional regulation, Facilitate acceptance of mental health diagnosis and concerns, Facilitate patient progression through stages of change regarding substance use diagnoses and concerns, Identify triggers associated with mental health/substance abuse issues, and Increase skills for wellness and recovery  Therapeutic Interventions: Assess for all discharge  needs, 1 to 1 time with Social worker, Explore available resources and support systems, Assess for adequacy in community support network, Educate family and significant other(s) on suicide prevention, Complete Psychosocial Assessment, Interpersonal group therapy.  Evaluation of Outcomes: Progressing   Progress in Treatment: Attending groups: No. Participating in groups: No. Taking medication as prescribed: Yes. Toleration medication: Yes. Family/Significant other contact made: 07/09/24 Yes, Etha Chang, [phone]. Patient understands diagnosis: No. Discussing patient identified problems/goals with staff: Yes. Medical problems stabilized or resolved: Yes. Denies suicidal/homicidal ideation: Yes. Issues/concerns per patient self-inventory: No. Other: 07/09/24 Update: none  New problem(s) identified: None identified. Update: 05/13/2024 No changes at this time. Update 05/18/2024:  No changes at this time. Update 05/23/2024:  No changes at this time. Update 05/28/2024:  No changes at this time. Update 06/02/2024:  No changes at this time. Update 06/07/2024:  No changes at this time. 06/12/24 Update: Patient has been isolative to her room with minimal interaction. Update 06/18/24:  No changes at this time  Update 06/24/24:  No changes at this time Update 06/29/24:  No changes at this time 07/04/24 Update: No changes. 07/09/24    New Short Term/Long Term Goal(s): elimination of symptoms of psychosis, medication management for mood stabilization; elimination of SI thoughts; development of comprehensive mental wellness plan. Update: 05/13/2024 No changes at this time.Update: 05/18/2024 No changes  at this time. Update 05/23/2024:  No changes at this time. Update 05/28/2024:  No changes at this time. Update 06/02/2024:  No changes at this time. Update 06/07/2024:  No changes at this time. 06/12/24 Update: No changes at this time. Update 06/18/24:  No changes at this time  Update 06/24/24:  No changes  at this time Update 06/29/24:  No changes at this time 07/04/24 Update: No changes. 07/09/24: No change    Patient Goals:   I just need to leave, I don't need it hereUpdate: 05/13/2024 No changes at this time.Update: 05/18/2024 No changes at this time. Update 05/23/2024:  No changes at this time.Update 05/28/2024:  No changes at this time. Update 06/02/2024:  No changes at this time. Update 06/07/2024:  No changes at this time. 06/12/24 Update: No changes at this time. Update 06/18/24:  No changes at this time Update 06/24/24:  No changes at this time Update 06/29/24:  No changes at this time 07/04/24 Update: No changes. 07/09/24: No changes at this time.    Discharge Plan or Barriers: CSW will assist with appropriate discharge planning. Update: 05/13/2024 No changes at this time.Update: 05/18/2024 No changes at this time. Update 05/23/2024:  No changes at this time.Update 05/28/2024:  CSW unable to identify appropriate decision maker, as pt's legal guardian has passed and CSW has reached out to APS caseworker multiple times to get clarity on protocol for enforcing medication until an appropriate party has been legal identified Update 06/02/2024:  No changes at this time. Update 06/07/2024:  CSW and provider still working to clarify appropriate horticulturist, commercial. Please refer to extensive notes in medical record. 06/12/24 Update: CSW and provider still working with DSS to obtain legal decision maker on patient's behalf. Update 06/18/24:  No changes at this time Update 06/24/24:  DSS received ex-parte, DSS to begin looking for placement, Dr.J to initiate enforced medication Update 06/29/24:  No changes at this time 07/04/24 Update: CSW to continue to provide support to APS case worker while DSS works on clinical cytogeneticist. No changes. 07/09/24  Reason for Continuation of Hospitalization: Aggression Hallucinations  Estimated Length of Stay:1 to 7 days. Update: 05/13/2024 TBDUpdate: 05/18/2024 TBD  Update 05/23/2024:  TBD Update 05/28/2024:  TBDUpdate 06/02/2024:  TBD Update 06/07/2024:  TBD 06/12/24 Update: TBD. Update 06/18/24:  TBD Update 06/24/24:  TBD Update 06/29/24:  TBD 07/04/24 Update: TBD.Update,  07/09/24    Last 3 [place] Suicide Severity Risk Score: Flowsheet Row Admission (Current) from 05/06/2024 in [facility] ED to Hosp-Admission (Discharged) from 05/02/2024 in [facility] ED from 04/30/2024 in [facility]  C-SSRS RISK CATEGORY No Risk No Risk No Risk    Last [facility] 2/9 Scores:     No data to display          Scribe for Treatment Team: Rexene LELON Chesley ISRAEL 07/09/2024 10:48 AM

## 2024-07-09 NOTE — Progress Notes (Signed)
 Behavior:  Cooperative and pleasant.     Psych assessment: Denies SI.  No complaints voiced.   Interaction / Group attendance:  Present in milieu this afternoon.  Attended MHT group. No interaction with peers.  Minimal interaction with staff.   Medication/ PRNs: Compliant with PO meds.  Pain: Denies.  15 min checks in place for safety.

## 2024-07-09 NOTE — Group Note (Signed)
 Date:  07/09/2024 Time:  4:16 PM  Group Topic/Focus:  Wellness Toolbox:   The focus of this group is to discuss various aspects of wellness, balancing those aspects and exploring ways to increase the ability to experience wellness.  Patients will create a wellness toolbox for use upon discharge.    Participation Level:  Minimal  Participation Quality:  Appropriate and Resistant  Affect:  Appropriate and Flat  Cognitive:  Alert  Insight: Appropriate  Engagement in Group:  Limited and Resistant  Modes of Intervention:  Activity and Discussion  Additional Comments:     Maglione,Rilya Longo E 07/09/2024, 4:16 PM

## 2024-07-09 NOTE — Progress Notes (Addendum)
 Phone call back [facility] MD Progress Note  07/08/2024 8:39 AM [name]  MRN:  [id number]  [name] is a 63 y.o. female admitted: Medicallyfor 05/02/2024 10:56 AM for dehydration, failure to thrive and hypoglycemia. She carries the psychiatric diagnoses of Schizophrenia and has medical hx of hypoglycemia and failure to thrive and has a past medical history of sinus bradycardia, chronic anemia, severe malnutrition and recurrent hypoglycemia.. Her current presentation of verbal aggression, agitation and refusal to eat with severe paranoia is most consistent with paranoid schizophrenia. [name]  She was at [facility] where she was admitted on 04/30/2024.  She was transferred to the ED because of failure to thrive and was admitted to medical floor.Patient is on IVC and patient has been adamantly refusing all oral medications and refusing to eat.  Patient is admitted to [facility] unit with Q15 min safety monitoring. Multidisciplinary team approach is offered. Medication management; group/milieu therapy is offered.    Subjective:  Chart reviewed, case discussed in multidisciplinary meeting, patient seen during rounds.    \.  She continues to refuse medications.  She denies any suicidal or homicidal thoughts she spends most of the time in her room Emailed the legal Tess Chang, [phone] (desk)/ [phone])  for consent for nonemergent forced medication.  Spoke with Ms. Draper on 07/06/2024 and received consent for nonemergent forced medication.  If patient refuses Haldol  oral she will receive IM Haldol . Past Psychiatric History: see h&P Family History: History reviewed. No pertinent family history. Social History:  Social History   Substance and Sexual Activity  Alcohol Use Not Currently   Comment: Refuses to disclose how much     Social History   Substance and Sexual Activity  Drug Use No   Comment: Refuses to answer    Social History   Socioeconomic History   Marital status:  Single    Spouse name: Not on file   Number of children: Not on file   Years of education: Not on file   Highest education level: Not on file  Occupational History   Not on file  Tobacco Use   Smoking status: Every Day    Current packs/day: 2.00    Types: Cigarettes    Passive exposure: Current   Smokeless tobacco: Never  Vaping Use   Vaping status: Never Used  Substance and Sexual Activity   Alcohol use: Not Currently    Comment: Refuses to disclose how much   Drug use: No    Comment: Refuses to answer   Sexual activity: Not Currently    Comment: refused to answer  Other Topics Concern   Not on file  Social History Narrative   Not on file   Social Drivers of Health   Tobacco Use: High Risk (05/06/2024)   Patient History    Smoking Tobacco Use: Every Day    Smokeless Tobacco Use: Never    Passive Exposure: Current  Financial Resource Strain: Not on file  Food Insecurity: Patient Declined (05/06/2024)   Epic    Worried About Programme Researcher, Broadcasting/film/video in the Last Year: Patient declined    Barista in the Last Year: Patient declined  Transportation Needs: Patient Declined (05/06/2024)   Epic    Lack of Transportation (Medical): Patient declined    Lack of Transportation (Non-Medical): Patient declined  Physical Activity: Not on file  Stress: Not on file  Social Connections: Not on file  Depression (PHQ2-9): Not on file  Alcohol Screen: Low Risk (05/06/2024)  Alcohol Screen    Last Alcohol Screening Score (AUDIT): 0  Housing: Unknown (05/06/2024)   Epic    Unable to Pay for Housing in the Last Year: Patient declined    Number of Times Moved in the Last Year: Not on file    Homeless in the Last Year: Not on file  Utilities: Patient Declined (05/06/2024)   Epic    Threatened with loss of utilities: Patient declined  Health Literacy: Not on file   Past Medical History:  Past Medical History:  Diagnosis Date   Anemia 10/02/2021   Non compliance w medication  regimen    Schizophrenia [facility])     Past Surgical History:  Procedure Laterality Date   BIOPSY  09/25/2021   Procedure: BIOPSY;  Surgeon: Teressa Toribio SQUIBB, MD;  Location: [facility];  Service: Gastroenterology;;   ESOPHAGOGASTRODUODENOSCOPY (EGD) WITH PROPOFOL  [name] 09/25/2021   Procedure: ESOPHAGOGASTRODUODENOSCOPY (EGD) WITH PROPOFOL ;  Surgeon: Teressa Toribio SQUIBB, MD;  Location: [facility];  Service: Gastroenterology;  Laterality: [name];  With NGT placement    Current Medications: Current Facility-Administered Medications  Medication Dose Route Frequency Provider Last Rate Last Admin   acetaminophen  (TYLENOL ) tablet 650 mg  650 mg Oral Q6H PRN Starkes-Perry, Takia S, FNP       alum & mag hydroxide-simeth (MAALOX/MYLANTA) 200-200-20 MG/5ML suspension 30 mL  30 mL Oral Q4H PRN Starkes-Perry, Takia S, FNP       haloperidol  (HALDOL ) tablet 5 mg  5 mg Oral BID Jadapalle, Sree, MD   5 mg at 07/08/24 2123   Or   haloperidol  lactate (HALDOL ) injection 5 mg  5 mg Intramuscular BID Jadapalle, Sree, MD   5 mg at 07/06/24 1922   HYDROcodone -acetaminophen  (NORCO/VICODIN) 5-325 MG per tablet 1-2 tablet  1-2 tablet Oral Q4H PRN Starkes-Perry, Takia S, FNP       magnesium  hydroxide (MILK OF MAGNESIA) suspension 30 mL  30 mL Oral Daily PRN Starkes-Perry, Takia S, FNP       multivitamin with minerals tablet 1 tablet  1 tablet Oral Daily Starkes-Perry, Takia S, FNP   1 tablet at 07/08/24 9080   OLANZapine  (ZYPREXA ) injection 5 mg  5 mg Intramuscular TID PRN Wilkie Majel RAMAN, FNP       OLANZapine  (ZYPREXA ) tablet 15 mg  15 mg Oral BID Jadapalle, Sree, MD   15 mg at 07/08/24 2123   OLANZapine  zydis (ZYPREXA ) disintegrating tablet 5 mg  5 mg Oral TID PRN Starkes-Perry, Takia S, FNP       ondansetron  (ZOFRAN ) tablet 4 mg  4 mg Oral Q6H PRN Starkes-Perry, Majel RAMAN, FNP       Or   ondansetron  (ZOFRAN ) injection 4 mg  4 mg Intravenous Q6H PRN Starkes-Perry, Majel RAMAN, FNP       thiamine  (VITAMIN B1) tablet 100 mg   100 mg Oral Daily Wilkie Majel RAMAN, FNP   100 mg at 07/08/24 9080   traZODone  (DESYREL ) tablet 50 mg  50 mg Oral QHS Starkes-Perry, Takia S, FNP   50 mg at 07/08/24 2123    Lab Results:  No results found for this or any previous visit (from the past 48 hours).   Blood Alcohol level:  Lab Results  Component Value Date   [facility] <15 05/02/2024   ETH <15 05/01/2024    Metabolic Disorder Labs: Lab Results  Component Value Date   HGBA1C 5.6 05/14/2024   MPG 114.02 05/14/2024   MPG 116.89 05/02/2024   Lab Results  Component Value Date  PROLACTIN 13.6 04/16/2015   Lab Results  Component Value Date   CHOL 219 (H) 05/14/2024   TRIG 84 05/14/2024   HDL 83 05/14/2024   CHOLHDL 2.6 05/14/2024   VLDL 17 05/14/2024   LDLCALC 119 (H) 05/14/2024   LDLCALC 130 (H) 05/01/2024    Physical Findings: AIMS:  , ,  ,  ,    CIWA:    COWS:      Psychiatric Specialty Exam:  Presentation  General Appearance:  Casual  Eye Contact: Fair  Speech: Normal rate  Speech Volume: Increased    Mood and Affect  Mood: Angry  Affect: Congruent   Thought Process  Thought Processes: Disorganized  Descriptions of Associations:Tangential  Orientation:Partial  Thought Content:Illogical; Tangential  Hallucinations:RESPONDING TO INTERNAL STIMULI Ideas of Reference:Paranoia; Delusions; Percusatory  Suicidal Thoughts:not endorsing Homicidal Thoughts:nor endorsing  Sensorium  Memory: Recent Poor  Judgment: Poor  Insight: Poor   Executive Functions  Concentration: Poor  Attention Span: Poor  Recall: Poor  Fund of Knowledge: Poor  Language: Poor   Psychomotor Activity  Psychomotor Activity:No data recorded Musculoskeletal: Strength & Muscle Tone: within normal limits Gait & Station: normal Assets  Assets: Resilience    Physical Exam: Physical Exam Vitals and nursing note reviewed.    ROS Blood pressure 102/65, pulse (!) 57, temperature  (!) 97.4 F (36.3 C), resp. rate 17, height 5' 4 (1.626 m), weight 44.9 kg, SpO2 100%. Body mass index is 16.99 kg/m.  Diagnosis: Active Problems:   Schizophrenia, paranoid (HCC) Paranoid  PLAN: Safety and Monitoring:  -- Involuntary admission to inpatient psychiatric unit for safety, stabilization and treatment  -- Daily contact with patient to assess and evaluate symptoms and progress in treatment  -- Patient's case to be discussed in multi-disciplinary team meeting  -- Observation Level : q15 minute checks  -- Vital signs:  q12 hours  -- Precautions: suicide, elopement, and assault -- Encouraged patient to participate in unit milieu and in scheduled group therapies  2. Psychiatric Diagnoses and Treatment: Patient  2 physicians approved non emergent forced medications legal guardian gave consent for Haldol  oral or IM  Zyprexa  15 mg BID  Haldol  5 mg bid added -DSS gave consent-nonemergent forced medication consented by legal guardian and patient will receive Haldol  5 mg twice daily IM if she refuses oral . Discharge Planning:   -- Social work and case management to assist with discharge planning and identification of hospital follow-up needs prior to discharge  -- Estimated LOS: 3-4 days  Millie JONELLE Manners, MD 07/09/2024, 8:39 AM

## 2024-07-09 NOTE — Group Note (Signed)
 Date:  07/09/2024 Time:  10:44 PM  Group Topic/Focus:  Wrap-Up Group:   The focus of this group is to help patients review their daily goal of treatment and discuss progress on daily workbooks.    Participation Level:  Did Not Attend  Participation Quality:     Affect:     Cognitive:     Insight: None  Engagement in Group:     Modes of Intervention:     Additional Comments:    [name] 07/09/2024, 10:44 PM

## 2024-07-09 NOTE — Progress Notes (Signed)
 Phone call back [facility] MD Progress Note  07/09/2024 10:44 AM [name]  MRN:  [id number]  [name] is a 63 y.o. female admitted: Medicallyfor 05/02/2024 10:56 AM for dehydration, failure to thrive and hypoglycemia. She carries the psychiatric diagnoses of Schizophrenia and has medical hx of hypoglycemia and failure to thrive and has a past medical history of sinus bradycardia, chronic anemia, severe malnutrition and recurrent hypoglycemia.. Her current presentation of verbal aggression, agitation and refusal to eat with severe paranoia is most consistent with paranoid schizophrenia. [name]  She was at [facility] where she was admitted on 04/30/2024.  She was transferred to the ED because of failure to thrive and was admitted to medical floor.Patient is on IVC and patient has been adamantly refusing all oral medications and refusing to eat.  Patient is admitted to [facility] unit with Q15 min safety monitoring. Multidisciplinary team approach is offered. Medication management; group/milieu therapy is offered.    Subjective:  Chart reviewed, case discussed in multidisciplinary meeting, patient seen during rounds.    \.  She continues to refuse medications.  She denies any suicidal or homicidal thoughts she spends most of the time in her room Emailed the legal Tess Chang, [phone] (desk)/ [phone])  for consent for nonemergent forced medication.  Spoke with Ms. Draper on 07/06/2024 and received consent for nonemergent forced medication.  If patient refuses Haldol  oral she will receive IM Haldol . Past Psychiatric History: see h&P Family History: History reviewed. No pertinent family history. Social History:  Social History   Substance and Sexual Activity  Alcohol Use Not Currently   Comment: Refuses to disclose how much     Social History   Substance and Sexual Activity  Drug Use No   Comment: Refuses to answer    Social History   Socioeconomic History   Marital status:  Single    Spouse name: Not on file   Number of children: Not on file   Years of education: Not on file   Highest education level: Not on file  Occupational History   Not on file  Tobacco Use   Smoking status: Every Day    Current packs/day: 2.00    Types: Cigarettes    Passive exposure: Current   Smokeless tobacco: Never  Vaping Use   Vaping status: Never Used  Substance and Sexual Activity   Alcohol use: Not Currently    Comment: Refuses to disclose how much   Drug use: No    Comment: Refuses to answer   Sexual activity: Not Currently    Comment: refused to answer  Other Topics Concern   Not on file  Social History Narrative   Not on file   Social Drivers of Health   Tobacco Use: High Risk (05/06/2024)   Patient History    Smoking Tobacco Use: Every Day    Smokeless Tobacco Use: Never    Passive Exposure: Current  Financial Resource Strain: Not on file  Food Insecurity: Patient Declined (05/06/2024)   Epic    Worried About Programme Researcher, Broadcasting/film/video in the Last Year: Patient declined    Barista in the Last Year: Patient declined  Transportation Needs: Patient Declined (05/06/2024)   Epic    Lack of Transportation (Medical): Patient declined    Lack of Transportation (Non-Medical): Patient declined  Physical Activity: Not on file  Stress: Not on file  Social Connections: Not on file  Depression (PHQ2-9): Not on file  Alcohol Screen: Low Risk (05/06/2024)  Alcohol Screen    Last Alcohol Screening Score (AUDIT): 0  Housing: Unknown (05/06/2024)   Epic    Unable to Pay for Housing in the Last Year: Patient declined    Number of Times Moved in the Last Year: Not on file    Homeless in the Last Year: Not on file  Utilities: Patient Declined (05/06/2024)   Epic    Threatened with loss of utilities: Patient declined  Health Literacy: Not on file   Past Medical History:  Past Medical History:  Diagnosis Date   Anemia 10/02/2021   Non compliance w medication  regimen    Schizophrenia [facility])     Past Surgical History:  Procedure Laterality Date   BIOPSY  09/25/2021   Procedure: BIOPSY;  Surgeon: Teressa Toribio SQUIBB, MD;  Location: [facility];  Service: Gastroenterology;;   ESOPHAGOGASTRODUODENOSCOPY (EGD) WITH PROPOFOL  [name] 09/25/2021   Procedure: ESOPHAGOGASTRODUODENOSCOPY (EGD) WITH PROPOFOL ;  Surgeon: Teressa Toribio SQUIBB, MD;  Location: [facility];  Service: Gastroenterology;  Laterality: [name];  With NGT placement    Current Medications: Current Facility-Administered Medications  Medication Dose Route Frequency Provider Last Rate Last Admin   acetaminophen  (TYLENOL ) tablet 650 mg  650 mg Oral Q6H PRN Starkes-Perry, Takia S, FNP       alum & mag hydroxide-simeth (MAALOX/MYLANTA) 200-200-20 MG/5ML suspension 30 mL  30 mL Oral Q4H PRN Starkes-Perry, Takia S, FNP       haloperidol  (HALDOL ) tablet 5 mg  5 mg Oral BID Jadapalle, Sree, MD   5 mg at 07/09/24 9079   Or   haloperidol  lactate (HALDOL ) injection 5 mg  5 mg Intramuscular BID Jadapalle, Sree, MD   5 mg at 07/06/24 1922   HYDROcodone -acetaminophen  (NORCO/VICODIN) 5-325 MG per tablet 1-2 tablet  1-2 tablet Oral Q4H PRN Starkes-Perry, Takia S, FNP       magnesium  hydroxide (MILK OF MAGNESIA) suspension 30 mL  30 mL Oral Daily PRN Starkes-Perry, Takia S, FNP       multivitamin with minerals tablet 1 tablet  1 tablet Oral Daily Wilkie Majel RAMAN, FNP   1 tablet at 07/09/24 0920   OLANZapine  (ZYPREXA ) injection 5 mg  5 mg Intramuscular TID PRN Wilkie Majel RAMAN, FNP       OLANZapine  (ZYPREXA ) tablet 15 mg  15 mg Oral BID Jadapalle, Sree, MD   15 mg at 07/09/24 9080   OLANZapine  zydis (ZYPREXA ) disintegrating tablet 5 mg  5 mg Oral TID PRN Starkes-Perry, Takia S, FNP       ondansetron  (ZOFRAN ) tablet 4 mg  4 mg Oral Q6H PRN Starkes-Perry, Majel RAMAN, FNP       Or   ondansetron  (ZOFRAN ) injection 4 mg  4 mg Intravenous Q6H PRN Starkes-Perry, Majel RAMAN, FNP       thiamine  (VITAMIN B1) tablet 100 mg   100 mg Oral Daily Wilkie Majel RAMAN, FNP   100 mg at 07/09/24 0920   traZODone  (DESYREL ) tablet 50 mg  50 mg Oral QHS Starkes-Perry, Takia S, FNP   50 mg at 07/08/24 2123    Lab Results:  No results found for this or any previous visit (from the past 48 hours).   Blood Alcohol level:  Lab Results  Component Value Date   [facility] <15 05/02/2024   ETH <15 05/01/2024    Metabolic Disorder Labs: Lab Results  Component Value Date   HGBA1C 5.6 05/14/2024   MPG 114.02 05/14/2024   MPG 116.89 05/02/2024   Lab Results  Component Value Date  PROLACTIN 13.6 04/16/2015   Lab Results  Component Value Date   CHOL 219 (H) 05/14/2024   TRIG 84 05/14/2024   HDL 83 05/14/2024   CHOLHDL 2.6 05/14/2024   VLDL 17 05/14/2024   LDLCALC 119 (H) 05/14/2024   LDLCALC 130 (H) 05/01/2024    Physical Findings: AIMS:  , ,  ,  ,    CIWA:    COWS:      Psychiatric Specialty Exam:  Presentation  General Appearance:  Casual  Eye Contact: Fair  Speech: Normal rate  Speech Volume: Increased    Mood and Affect  Mood: Angry  Affect: Congruent   Thought Process  Thought Processes: Disorganized  Descriptions of Associations:Tangential  Orientation:Partial  Thought Content:Illogical; Tangential  Hallucinations:RESPONDING TO INTERNAL STIMULI Ideas of Reference:Paranoia; Delusions; Percusatory  Suicidal Thoughts:not endorsing Homicidal Thoughts:nor endorsing  Sensorium  Memory: Recent Poor  Judgment: Poor  Insight: Poor   Executive Functions  Concentration: Poor  Attention Span: Poor  Recall: Poor  Fund of Knowledge: Poor  Language: Poor   Psychomotor Activity  Psychomotor Activity:No data recorded Musculoskeletal: Strength & Muscle Tone: within normal limits Gait & Station: normal Assets  Assets: Resilience    Physical Exam: Physical Exam Vitals and nursing note reviewed.    ROS Blood pressure 102/65, pulse (!) 57, temperature  (!) 97.4 F (36.3 C), resp. rate 17, height 5' 4 (1.626 m), weight 44.9 kg, SpO2 100%. Body mass index is 16.99 kg/m.  Diagnosis: Active Problems:   Schizophrenia, paranoid (HCC) Paranoid  PLAN: Safety and Monitoring:  -- Involuntary admission to inpatient psychiatric unit for safety, stabilization and treatment  -- Daily contact with patient to assess and evaluate symptoms and progress in treatment  -- Patient's case to be discussed in multi-disciplinary team meeting  -- Observation Level : q15 minute checks  -- Vital signs:  q12 hours  -- Precautions: suicide, elopement, and assault -- Encouraged patient to participate in unit milieu and in scheduled group therapies  2. Psychiatric Diagnoses and Treatment: Patient  2 physicians approved non emergent forced medications legal guardian gave consent for Haldol  oral or IM  Zyprexa  15 mg BID  Haldol  5 mg bid added -DSS gave consent-nonemergent forced medication consented by legal guardian and patient will receive Haldol  5 mg twice daily IM if she refuses oral . Discharge Planning:   -- Social work and case management to assist with discharge planning and identification of hospital follow-up needs prior to discharge  -- Estimated LOS: 3-4 days  Millie JONELLE Manners, MD 07/09/2024, 10:44 AM

## 2024-07-09 NOTE — Group Note (Signed)
 Date:  07/09/2024 Time:  10:34 AM  Group Topic/Focus:  Movement Therapy, Morning Stretch with Chauntelle Azpeitia.    Participation Level:  Did Not Attend    [name] Bias 07/09/2024, 10:34 AM

## 2024-07-10 NOTE — Progress Notes (Signed)
 Patient has a sad and flat affect compliant with treatment. Is isolative to her room. Denies SI/HI/A/VH and verbally contacts for safety. Patient requesting to be discharged to her home. Support provided as needed. No medication adverse effects noted.

## 2024-07-10 NOTE — Group Note (Signed)
 Date:  07/10/2024 Time:  4:49 PM  Group Topic/Focus:  Building Self Esteem:   The Focus of this group is helping patients become aware of the effects of self-esteem on their lives, the things they and others do that enhance or undermine their self-esteem, seeing the relationship between their level of self-esteem and the choices they make and learning ways to enhance self-esteem.    Participation Level:  Did Not Attend   [name] 07/10/2024, 4:49 PM

## 2024-07-10 NOTE — Plan of Care (Signed)
  Problem: Education: Goal: Knowledge of [place] [facility] Education information/materials will improve Outcome: Progressing Goal: Emotional status will improve Outcome: Progressing Goal: Mental status will improve Outcome: Progressing Goal: Verbalization of understanding the information provided will improve Outcome: Progressing   Problem: Activity: Goal: Interest or engagement in activities will improve Outcome: Progressing Goal: Sleeping patterns will improve Outcome: Progressing   Problem: Coping: Goal: Ability to verbalize frustrations and anger appropriately will improve Outcome: Progressing Goal: Ability to demonstrate self-control will improve Outcome: Progressing   Problem: Health Behavior/Discharge Planning: Goal: Identification of resources available to assist in meeting health care needs will improve Outcome: Progressing Goal: Compliance with treatment plan for underlying cause of condition will improve Outcome: Progressing   Problem: Physical Regulation: Goal: Ability to maintain clinical measurements within normal limits will improve Outcome: Progressing   Problem: Safety: Goal: Periods of time without injury will increase Outcome: Progressing   Problem: Education: Goal: Knowledge of General Education information will improve Description: Including pain rating scale, medication(s)/side effects and non-pharmacologic comfort measures Outcome: Progressing   Problem: Health Behavior/Discharge Planning: Goal: Ability to manage health-related needs will improve Outcome: Progressing   Problem: Clinical Measurements: Goal: Ability to maintain clinical measurements within normal limits will improve Outcome: Progressing Goal: Will remain free from infection Outcome: Progressing Goal: Diagnostic test results will improve Outcome: Progressing Goal: Respiratory complications will improve Outcome: Progressing Goal: Cardiovascular complication will be  avoided Outcome: Progressing   Problem: Activity: Goal: Risk for activity intolerance will decrease Outcome: Progressing   Problem: Nutrition: Goal: Adequate nutrition will be maintained Outcome: Progressing   Problem: Coping: Goal: Level of anxiety will decrease Outcome: Progressing   Problem: Elimination: Goal: Will not experience complications related to bowel motility Outcome: Progressing Goal: Will not experience complications related to urinary retention Outcome: Progressing   Problem: Pain Managment: Goal: General experience of comfort will improve and/or be controlled Outcome: Progressing   Problem: Safety: Goal: Ability to remain free from injury will improve Outcome: Progressing   Problem: Skin Integrity: Goal: Risk for impaired skin integrity will decrease Outcome: Progressing   Problem: Health Behavior/Discharge Planning: Goal: Compliance with prescribed medication regimen will improve Outcome: Progressing   Problem: Nutritional: Goal: Ability to achieve adequate nutritional intake will improve Outcome: Progressing

## 2024-07-10 NOTE — Progress Notes (Signed)
 Phone call back [facility] MD Progress Note  07/10/2024 12:34 PM [name]  MRN:  [id number]  [name] is a 63 y.o. female admitted: Medicallyfor 05/02/2024 10:56 AM for dehydration, failure to thrive and hypoglycemia. She carries the psychiatric diagnoses of Schizophrenia and has medical hx of hypoglycemia and failure to thrive and has a past medical history of sinus bradycardia, chronic anemia, severe malnutrition and recurrent hypoglycemia.. Her current presentation of verbal aggression, agitation and refusal to eat with severe paranoia is most consistent with paranoid schizophrenia. [name]  She was at [facility] where she was admitted on 04/30/2024.  She was transferred to the ED because of failure to thrive and was admitted to medical floor.Patient is on IVC and patient has been adamantly refusing all oral medications and refusing to eat.  Patient is admitted to [facility] unit with Q15 min safety monitoring. Multidisciplinary team approach is offered. Medication management; group/milieu therapy is offered.    Subjective:  Chart reviewed, case discussed in multidisciplinary meeting, patient seen during rounds.    Patient is noted to be resting in bed.  She offers no complaints.  Per nursing reports she is taking her medications voluntarily and does not require any nonemergent forced medication consult.  She denies having any physical complaints or side effects to medications including EPS.  She is noted to be talking to herself intermittently but is much calmer today.  She continues to decline answering any mental health questions related to hallucinations or SI/HI. Emailed the legal [name], [phone] (desk)/ [phone])  for consent for nonemergent forced medication.  Spoke with [name] on 07/06/2024 and received consent for nonemergent forced medication.  If patient refuses Haldol  oral she will receive IM Haldol . Past Psychiatric History: see h&P Family History: History reviewed.  No pertinent family history. Social History:  Social History   Substance and Sexual Activity  Alcohol Use Not Currently   Comment: Refuses to disclose how much     Social History   Substance and Sexual Activity  Drug Use No   Comment: Refuses to answer    Social History   Socioeconomic History   Marital status: Single    Spouse name: Not on file   Number of children: Not on file   Years of education: Not on file   Highest education level: Not on file  Occupational History   Not on file  Tobacco Use   Smoking status: Every Day    Current packs/day: 2.00    Types: Cigarettes    Passive exposure: Current   Smokeless tobacco: Never  Vaping Use   Vaping status: Never Used  Substance and Sexual Activity   Alcohol use: Not Currently    Comment: Refuses to disclose how much   Drug use: No    Comment: Refuses to answer   Sexual activity: Not Currently    Comment: refused to answer  Other Topics Concern   Not on file  Social History Narrative   Not on file   Social Drivers of Health   Tobacco Use: High Risk (05/06/2024)   Patient History    Smoking Tobacco Use: Every Day    Smokeless Tobacco Use: Never    Passive Exposure: Current  Financial Resource Strain: Not on file  Food Insecurity: Patient Declined (05/06/2024)   Epic    Worried About Programme Researcher, Broadcasting/film/video in the Last Year: Patient declined    Barista in the Last Year: Patient declined  Transportation Needs: Patient Declined (05/06/2024)  Epic    Lack of Transportation (Medical): Patient declined    Lack of Transportation (Non-Medical): Patient declined  Physical Activity: Not on file  Stress: Not on file  Social Connections: Not on file  Depression [phone]): Not on file  Alcohol Screen: Low Risk (05/06/2024)   Alcohol Screen    Last Alcohol Screening Score (AUDIT): 0  Housing: Unknown (05/06/2024)   Epic    Unable to Pay for Housing in the Last Year: Patient declined    Number of Times Moved  in the Last Year: Not on file    Homeless in the Last Year: Not on file  Utilities: Patient Declined (05/06/2024)   Epic    Threatened with loss of utilities: Patient declined  Health Literacy: Not on file   Past Medical History:  Past Medical History:  Diagnosis Date   Anemia 10/02/2021   Non compliance w medication regimen    Schizophrenia (HCC)     Past Surgical History:  Procedure Laterality Date   BIOPSY  09/25/2021   Procedure: BIOPSY;  Surgeon: Teressa Toribio SQUIBB, MD;  Location: [facility];  Service: Gastroenterology;;   ESOPHAGOGASTRODUODENOSCOPY (EGD) WITH PROPOFOL  [name] 09/25/2021   Procedure: ESOPHAGOGASTRODUODENOSCOPY (EGD) WITH PROPOFOL ;  Surgeon: Teressa Toribio SQUIBB, MD;  Location: [facility];  Service: Gastroenterology;  Laterality: [name];  With NGT placement    Current Medications: Current Facility-Administered Medications  Medication Dose Route Frequency Provider Last Rate Last Admin   acetaminophen  (TYLENOL ) tablet 650 mg  650 mg Oral Q6H PRN Starkes-Perry, Takia S, FNP       alum & mag hydroxide-simeth (MAALOX/MYLANTA) 200-200-20 MG/5ML suspension 30 mL  30 mL Oral Q4H PRN Starkes-Perry, Takia S, FNP       haloperidol  (HALDOL ) tablet 5 mg  5 mg Oral BID Phillips Goulette, MD   5 mg at 07/10/24 9093   Or   haloperidol  lactate (HALDOL ) injection 5 mg  5 mg Intramuscular BID Lilyth Lawyer, MD   5 mg at 07/06/24 1922   HYDROcodone -acetaminophen  (NORCO/VICODIN) 5-325 MG per tablet 1-2 tablet  1-2 tablet Oral Q4H PRN Starkes-Perry, Takia S, FNP       magnesium  hydroxide (MILK OF MAGNESIA) suspension 30 mL  30 mL Oral Daily PRN Starkes-Perry, Takia S, FNP       multivitamin with minerals tablet 1 tablet  1 tablet Oral Daily Wilkie Majel RAMAN, FNP   1 tablet at 07/10/24 9093   OLANZapine  (ZYPREXA ) injection 5 mg  5 mg Intramuscular TID PRN Starkes-Perry, Takia S, FNP       OLANZapine  (ZYPREXA ) tablet 15 mg  15 mg Oral BID Nahdia Doucet, MD   15 mg at 07/10/24 9093    OLANZapine  zydis (ZYPREXA ) disintegrating tablet 5 mg  5 mg Oral TID PRN Starkes-Perry, Takia S, FNP       ondansetron  (ZOFRAN ) tablet 4 mg  4 mg Oral Q6H PRN Starkes-Perry, Majel RAMAN, FNP       Or   ondansetron  (ZOFRAN ) injection 4 mg  4 mg Intravenous Q6H PRN Starkes-Perry, Majel RAMAN, FNP       thiamine  (VITAMIN B1) tablet 100 mg  100 mg Oral Daily Wilkie Majel RAMAN, FNP   100 mg at 07/10/24 9092   traZODone  (DESYREL ) tablet 50 mg  50 mg Oral QHS Starkes-Perry, Takia S, FNP   50 mg at 07/09/24 2111    Lab Results:  No results found for this or any previous visit (from the past 48 hours).   Blood Alcohol level:  Lab Results  Component Value Date   [facility] <15 05/02/2024   ETH <15 05/01/2024    Metabolic Disorder Labs: Lab Results  Component Value Date   HGBA1C 5.6 05/14/2024   MPG 114.02 05/14/2024   MPG 116.89 05/02/2024   Lab Results  Component Value Date   PROLACTIN 13.6 04/16/2015   Lab Results  Component Value Date   CHOL 219 (H) 05/14/2024   TRIG 84 05/14/2024   HDL 83 05/14/2024   CHOLHDL 2.6 05/14/2024   VLDL 17 05/14/2024   LDLCALC 119 (H) 05/14/2024   LDLCALC 130 (H) 05/01/2024    Physical Findings: AIMS:  , ,  ,  ,    CIWA:    COWS:      Psychiatric Specialty Exam:  Presentation  General Appearance:  Casual  Eye Contact: Fair  Speech: Normal rate  Speech Volume: Increased    Mood and Affect  Mood: Angry  Affect: Congruent   Thought Process  Thought Processes: Disorganized  Descriptions of Associations:Tangential  Orientation:Partial  Thought Content:Illogical; Tangential  Hallucinations:RESPONDING TO INTERNAL STIMULI Ideas of Reference:Paranoia; Delusions; Percusatory  Suicidal Thoughts:not endorsing Homicidal Thoughts:nor endorsing  Sensorium  Memory: Recent Poor  Judgment: Poor  Insight: Poor   Executive Functions  Concentration: Poor  Attention Span: Poor  Recall: Poor  Fund of  Knowledge: Poor  Language: Poor   Psychomotor Activity  Psychomotor Activity:No data recorded Musculoskeletal: Strength & Muscle Tone: within normal limits Gait & Station: normal Assets  Assets: Resilience    Physical Exam: Physical Exam Vitals and nursing note reviewed.    ROS Blood pressure 98/76, pulse (!) 57, temperature (!) 97.5 F (36.4 C), temperature source Oral, resp. rate 18, height 5' 4 (1.626 m), weight 44.9 kg, SpO2 100%. Body mass index is 16.99 kg/m.  Diagnosis: Active Problems:   Schizophrenia, paranoid (HCC) Paranoid  PLAN: Safety and Monitoring:  -- Involuntary admission to inpatient psychiatric unit for safety, stabilization and treatment  -- Daily contact with patient to assess and evaluate symptoms and progress in treatment  -- Patient's case to be discussed in multi-disciplinary team meeting  -- Observation Level : q15 minute checks  -- Vital signs:  q12 hours  -- Precautions: suicide, elopement, and assault -- Encouraged patient to participate in unit milieu and in scheduled group therapies  2. Psychiatric Diagnoses and Treatment: Patient  2 physicians approved non emergent forced medications legal guardian gave consent for Haldol  oral or IM  Zyprexa  15 mg BID  Haldol  5 mg bid added -DSS gave consent-nonemergent forced medication consented by legal guardian and patient will receive Haldol  5 mg twice daily IM if she refuses oral . Discharge Planning:   -- Social work and case management to assist with discharge planning and identification of hospital follow-up needs prior to discharge  -- Estimated LOS: 3-4 days  Allyn Foil, MD 07/10/2024, 12:34 PM

## 2024-07-10 NOTE — Group Note (Signed)
 Date:  07/10/2024 Time:  2:06 PM  Group Topic/Focus:  Healthy Communication:   The focus of this group is to discuss communication, barriers to communication, as well as healthy ways to communicate with others. Making Healthy Choices:   The focus of this group is to help patients identify negative/unhealthy choices they were using prior to admission and identify positive/healthier coping strategies to replace them upon discharge. Managing Feelings:   The focus of this group is to identify what feelings patients have difficulty handling and develop a plan to handle them in a healthier way upon discharge. Wellness Toolbox:   The focus of this group is to discuss various aspects of wellness, balancing those aspects and exploring ways to increase the ability to experience wellness.  Patients will create a wellness toolbox for use upon discharge.  The group completed worksheets focused on expressing how each patient was feeling, followed by a positive Would You Rather activity. Patients also participated in a Complete the Sentence exercise, a simple crossword, and brain teasers to encourage interaction and engagement. The group discussed healthy ways to cope with daily stressors and practiced sharing coping strategies.  Participation Level:  Did Not Attend  Participation Quality:    Affect:    Cognitive:    Insight:   Engagement in Group:    Modes of Intervention:    Additional Comments:    Autym Siess L Vong Garringer 07/10/2024, 2:06 PM

## 2024-07-10 NOTE — Group Note (Signed)
 Recreation Therapy Group Note   Group Topic:Healthy Support Systems  Group Date: 07/10/2024 Start Time: 1400 End Time: 1435 Facilitators: Celestia Jeoffrey BRAVO, LRT, CTRS Location: Dayroom  Group Description: Straw Bridge. In groups or individually, patients were given 10 plastic drinking straws and an equal length of masking tape. Using the materials provided, patients were instructed to build a free-standing bridge-like structure to suspend an everyday item (ex: deck of cards) off the floor or table surface. All materials were required to be used in secondary school teacher. LRT facilitated post-activity discussion reviewing the importance of having strong and healthy support systems in our lives. LRT discussed how the people in our lives serve as the tape and the deck of cards we placed on top of our straw structure are the stressors we face in daily life. LRT and pts discussed what happens in our life when things get too heavy for us , and we don't have strong supports outside of the hospital. Pt shared 2 of their healthy supports in their life aloud in the group.   Goal Area(s) Addressed:  Patient will identify 2 healthy supports in their life. Patient will identify skills to successfully complete activity. Patient will identify correlation of this activity to life post-discharge.  Patient will build on frustration tolerance skills. Patient will increase team building and communication skills.     Affect/Mood: [name]   Participation Level: Did not attend    Clinical Observations/Individualized Feedback: Patient did not attend.  Plan: Continue to engage patient in RT group sessions 2-3x/week.   Jeoffrey BRAVO Celestia, LRT, CTRS 07/10/2024 4:29 PM

## 2024-07-10 NOTE — Progress Notes (Signed)
" °   07/09/24 2000  Psych Admission Type (Psych Patients Only)  Admission Status Involuntary  Psychosocial Assessment  Patient Complaints None  Eye Contact Avoids  Facial Expression Flat  Affect Appropriate to circumstance  Speech Soft  Interaction Minimal  Motor Activity Other (Comment) (WNL)  Appearance/Hygiene In scrubs  Behavior Characteristics Appropriate to situation;Cooperative  Mood Preoccupied  Thought Process  Coherency WDL  Content Preoccupation  Delusions None reported or observed  Perception WDL  Hallucination None reported or observed  Judgment Limited  Confusion Mild  Danger to Self  Current suicidal ideation? Denies  Self-Injurious Behavior No self-injurious ideation or behavior indicators observed or expressed   Agreement Not to Harm Self Yes  Description of Agreement verbal  Danger to Others  Danger to Others None reported or observed   Mood/Behavior: Calm and cooperative.   Psych assessment: Denies SI/HI and AVH.     Interaction / Group attendance: Isolative to self. Minimal interaction with peers and staff.  Did not attend group.   Medication/ PRNs: Compliant with scheduled meds. No prns required.     Pain: Denies     15 min checks in place for safety. "

## 2024-07-10 NOTE — Plan of Care (Signed)
  Problem: Coping: Goal: Ability to verbalize frustrations and anger appropriately will improve Outcome: Progressing Goal: Ability to demonstrate self-control will improve Outcome: Progressing   Problem: Health Behavior/Discharge Planning: Goal: Identification of resources available to assist in meeting health care needs will improve Outcome: Progressing Goal: Compliance with treatment plan for underlying cause of condition will improve Outcome: Progressing

## 2024-07-10 NOTE — Group Note (Signed)
 Physical/Occupational Therapy Group Note  Group Topic: Transfer Training   Group Date: 07/10/2024 Start Time: 1315 End Time: 1340 Facilitators: Clinton Wahlberg, Alm Hamilton, PT   Group Description: Group educated on sequence and techniques to maximize safety with functional transfers.  Additionally, integrated education on impact of seating surfaces, use of assistive device and management of orthostasis with movement transitions.  Patients actively engaged with functional transfers (sit/stand) from various seating surfaces, with and without assist devices, working to integrate and retain education provided during session.  Allowed time for questions and further discussion on mobility concerns/needs.   Therapeutic Goal(s):  Identify and demonstrate safe technique for sit/stand transfers from various seating surfaces. Identify and demonstrate safe use of assistive devices with basic transfers and simple mobility. Identify and demonstrate ability to recognize signs/symptoms of orthostasis and appropriate compensatory/safety techniques.  Individual Participation: Did not attend  Participation Level:   Participation Quality:   Behavior:   Speech/Thought Process:   Affect/Mood:   Insight:   Judgement:   Individualization:   Modes of Intervention:   Patient Response to Interventions:    Plan: Continue to engage patient in PT/OT groups 1 - 2x/week.  CHARM Hamilton Bertin PT, DPT 07/10/2024, 1:45 PM

## 2024-07-11 MED ORDER — HALOPERIDOL 5 MG PO TABS
5.0000 mg | ORAL_TABLET | Freq: Two times a day (BID) | ORAL | Status: AC
  Administered 2024-07-12 – 2024-08-25 (×90): 5 mg via ORAL
  Filled 2024-07-11 (×78): qty 1

## 2024-07-11 MED ORDER — HALOPERIDOL LACTATE 5 MG/ML IJ SOLN: 10.0000 mg | Freq: Two times a day (BID) | INTRAMUSCULAR | Status: DC

## 2024-07-11 NOTE — Progress Notes (Addendum)
" °  Patient isolated to room for most of the day, leaving only for meals and group activities. Patient remained withdrawn, with limited communication with staff and peers. Patient answered questions with yes maam or no maam and did not elaborate. Patient denies SI, HI, and AVH. Patient was observed pacing in room with lights off multiple times during rounds. Patient was compliant with medications. Safety maintained on the unit.      07/11/24 1000  Psych Admission Type (Psych Patients Only)  Admission Status Involuntary  Psychosocial Assessment  Patient Complaints None  Eye Contact Brief  Facial Expression Flat  Affect Appropriate to circumstance  Speech Soft  Interaction Minimal  Motor Activity Pacing (in room)  Appearance/Hygiene In scrubs  Behavior Characteristics Cooperative  Mood Pleasant;Preoccupied  Thought Process  Coherency WDL  Content Preoccupation  Delusions None reported or observed  Perception WDL  Hallucination None reported or observed  Judgment Limited  Confusion Mild  Danger to Self  Current suicidal ideation? Denies    "

## 2024-07-11 NOTE — Progress Notes (Signed)
" °   07/10/24 2100  Psych Admission Type (Psych Patients Only)  Admission Status Involuntary  Psychosocial Assessment  Patient Complaints None  Eye Contact Brief  Facial Expression Flat  Affect Appropriate to circumstance  Speech Soft  Interaction Minimal  Motor Activity Other (Comment) (wnl)  Appearance/Hygiene In scrubs  Behavior Characteristics Cooperative  Mood Preoccupied  Thought Process  Coherency WDL  Content Preoccupation  Delusions None reported or observed  Perception WDL  Hallucination None reported or observed  Judgment Limited  Confusion Mild  Danger to Self  Current suicidal ideation? Denies  Self-Injurious Behavior No self-injurious ideation or behavior indicators observed or expressed   Agreement Not to Harm Self Yes  Description of Agreement verbal  Danger to Others  Danger to Others None reported or observed   Mood/Behavior: Calm and cooperative.   Psych assessment: Denies SI/HI and AVH.     Interaction / Group attendance: Isolative to self. Minimal interaction with peers and staff.  Did not attend group.   Medication/ PRNs: Compliant with scheduled meds. No prns required.     Pain: Denies     15 min checks in place for safety. "

## 2024-07-11 NOTE — Group Note (Signed)
 Recreation Therapy Group Note   Group Topic:Leisure Education  Group Date: 07/11/2024 Start Time: 1400 End Time: 1450 Facilitators: Celestia Jeoffrey BRAVO, LRT, CTRS Location: Courtyard  Group Description: Music. Patients are encouraged to name their favorite song(s) for LRT to play song through speaker for group to hear, while in the courtyard getting fresh air and sunlight. Patients educated on the definition of leisure and the importance of having different leisure interests outside of the hospital. Group discussed how leisure activities can often be used as pharmacologist and that listening to music and being outside are examples.    Goal Area(s) Addressed:  Patient will identify a current leisure interest.  Patient will practice making a positive decision. Patient will have the opportunity to try a new leisure activity.   Affect/Mood: [name]   Participation Level: Did not attend    Clinical Observations/Individualized Feedback: Patient did not attend.  Plan: Continue to engage patient in RT group sessions 2-3x/week.   [address] LRT, CTRS 07/11/2024 4:25 PM

## 2024-07-11 NOTE — Progress Notes (Signed)
 Phone call back [facility] MD Progress Note  07/11/2024 4:30 PM [name]  MRN:  [id number]  [name] is a 63 y.o. female admitted: Medicallyfor 05/02/2024 10:56 AM for dehydration, failure to thrive and hypoglycemia. She carries the psychiatric diagnoses of Schizophrenia and has medical hx of hypoglycemia and failure to thrive and has a past medical history of sinus bradycardia, chronic anemia, severe malnutrition and recurrent hypoglycemia.. Her current presentation of verbal aggression, agitation and refusal to eat with severe paranoia is most consistent with paranoid schizophrenia. [name]  She was at [facility] where she was admitted on 04/30/2024.  She was transferred to the ED because of failure to thrive and was admitted to medical floor.Patient is on IVC and patient has been adamantly refusing all oral medications and refusing to eat.  Patient is admitted to [facility] unit with Q15 min safety monitoring. Multidisciplinary team approach is offered. Medication management; group/milieu therapy is offered.    Subjective:  Chart reviewed, case discussed in multidisciplinary meeting, patient seen during rounds.   Patient is noted to be walking in her room.  She remains verbally minimal and answers questions related to her physical health.  She is not responding to internal stimuli.  Per nursing patient is taking her medications and denies having any side effects including EPS.  She reports fair appetite and sleep.  She is not endorsing SI/HI/plan.  She is visible on the unit during her mealtime and some groups.  Discussed the plan with the treatment team about optimizing Haldol  and eventually giving her LAI Haldol  Decanoate  Emailed the legal [name], [phone] (desk)/ [phone])  for consent for nonemergent forced medication.  Spoke with [name] on 07/06/2024 and received consent for nonemergent forced medication.  If patient refuses Haldol  oral she will receive IM Haldol . Past  Psychiatric History: see h&P Family History: History reviewed. No pertinent family history. Social History:  Social History   Substance and Sexual Activity  Alcohol Use Not Currently   Comment: Refuses to disclose how much     Social History   Substance and Sexual Activity  Drug Use No   Comment: Refuses to answer    Social History   Socioeconomic History   Marital status: Single    Spouse name: Not on file   Number of children: Not on file   Years of education: Not on file   Highest education level: Not on file  Occupational History   Not on file  Tobacco Use   Smoking status: Every Day    Current packs/day: 2.00    Types: Cigarettes    Passive exposure: Current   Smokeless tobacco: Never  Vaping Use   Vaping status: Never Used  Substance and Sexual Activity   Alcohol use: Not Currently    Comment: Refuses to disclose how much   Drug use: No    Comment: Refuses to answer   Sexual activity: Not Currently    Comment: refused to answer  Other Topics Concern   Not on file  Social History Narrative   Not on file   Social Drivers of Health   Tobacco Use: High Risk (05/06/2024)   Patient History    Smoking Tobacco Use: Every Day    Smokeless Tobacco Use: Never    Passive Exposure: Current  Financial Resource Strain: Not on file  Food Insecurity: Patient Declined (05/06/2024)   Epic    Worried About Programme Researcher, Broadcasting/film/video in the Last Year: Patient declined    Merchant [employer] Officer  of Food in the Last Year: Patient declined  Transportation Needs: Patient Declined (05/06/2024)   Epic    Lack of Transportation (Medical): Patient declined    Lack of Transportation (Non-Medical): Patient declined  Physical Activity: Not on file  Stress: Not on file  Social Connections: Not on file  Depression (EYV7-0): Not on file  Alcohol Screen: Low Risk (05/06/2024)   Alcohol Screen    Last Alcohol Screening Score (AUDIT): 0  Housing: Unknown (05/06/2024)   Epic    Unable to Pay for  Housing in the Last Year: Patient declined    Number of Times Moved in the Last Year: Not on file    Homeless in the Last Year: Not on file  Utilities: Patient Declined (05/06/2024)   Epic    Threatened with loss of utilities: Patient declined  Health Literacy: Not on file   Past Medical History:  Past Medical History:  Diagnosis Date   Anemia 10/02/2021   Non compliance w medication regimen    Schizophrenia (HCC)     Past Surgical History:  Procedure Laterality Date   BIOPSY  09/25/2021   Procedure: BIOPSY;  Surgeon: Teressa Toribio SQUIBB, MD;  Location: [facility];  Service: Gastroenterology;;   ESOPHAGOGASTRODUODENOSCOPY (EGD) WITH PROPOFOL  [name] 09/25/2021   Procedure: ESOPHAGOGASTRODUODENOSCOPY (EGD) WITH PROPOFOL ;  Surgeon: Teressa Toribio SQUIBB, MD;  Location: [facility];  Service: Gastroenterology;  Laterality: [name];  With NGT placement    Current Medications: Current Facility-Administered Medications  Medication Dose Route Frequency Provider Last Rate Last Admin   acetaminophen  (TYLENOL ) tablet 650 mg  650 mg Oral Q6H PRN Starkes-Perry, Takia S, FNP       alum & mag hydroxide-simeth (MAALOX/MYLANTA) 200-200-20 MG/5ML suspension 30 mL  30 mL Oral Q4H PRN Starkes-Perry, Takia S, FNP       haloperidol  (HALDOL ) tablet 5 mg  5 mg Oral BID Herta Hink, MD   5 mg at 07/11/24 0830   Or   haloperidol  lactate (HALDOL ) injection 5 mg  5 mg Intramuscular BID Jovoni Borkenhagen, MD   5 mg at 07/06/24 1922   HYDROcodone -acetaminophen  (NORCO/VICODIN) 5-325 MG per tablet 1-2 tablet  1-2 tablet Oral Q4H PRN Starkes-Perry, Takia S, FNP       magnesium  hydroxide (MILK OF MAGNESIA) suspension 30 mL  30 mL Oral Daily PRN Starkes-Perry, Takia S, FNP       multivitamin with minerals tablet 1 tablet  1 tablet Oral Daily Starkes-Perry, Takia S, FNP   1 tablet at 07/11/24 0830   OLANZapine  (ZYPREXA ) injection 5 mg  5 mg Intramuscular TID PRN Starkes-Perry, Takia S, FNP       OLANZapine  (ZYPREXA ) tablet 15 mg   15 mg Oral BID Idonia Zollinger, MD   15 mg at 07/11/24 0830   OLANZapine  zydis (ZYPREXA ) disintegrating tablet 5 mg  5 mg Oral TID PRN Starkes-Perry, Takia S, FNP       ondansetron  (ZOFRAN ) tablet 4 mg  4 mg Oral Q6H PRN Starkes-Perry, Majel RAMAN, FNP       Or   ondansetron  (ZOFRAN ) injection 4 mg  4 mg Intravenous Q6H PRN Starkes-Perry, Majel RAMAN, FNP       thiamine  (VITAMIN B1) tablet 100 mg  100 mg Oral Daily Starkes-Perry, Takia S, FNP   100 mg at 07/11/24 0830   traZODone  (DESYREL ) tablet 50 mg  50 mg Oral QHS Starkes-Perry, Takia S, FNP   50 mg at 07/10/24 2118    Lab Results:  No results found for this or  any previous visit (from the past 48 hours).   Blood Alcohol level:  Lab Results  Component Value Date   [facility] <15 05/02/2024   ETH <15 05/01/2024    Metabolic Disorder Labs: Lab Results  Component Value Date   HGBA1C 5.6 05/14/2024   MPG 114.02 05/14/2024   MPG 116.89 05/02/2024   Lab Results  Component Value Date   PROLACTIN 13.6 04/16/2015   Lab Results  Component Value Date   CHOL 219 (H) 05/14/2024   TRIG 84 05/14/2024   HDL 83 05/14/2024   CHOLHDL 2.6 05/14/2024   VLDL 17 05/14/2024   LDLCALC 119 (H) 05/14/2024   LDLCALC 130 (H) 05/01/2024    Physical Findings: AIMS:  , ,  ,  ,    CIWA:    COWS:      Psychiatric Specialty Exam:  Presentation  General Appearance:  Casual  Eye Contact: Fair  Speech: Normal rate  Speech Volume: Increased    Mood and Affect  Mood: Angry  Affect: Congruent   Thought Process  Thought Processes: Disorganized  Descriptions of Associations:Tangential  Orientation:Partial  Thought Content:Illogical; Tangential  Hallucinations:RESPONDING TO INTERNAL STIMULI Ideas of Reference:Paranoia; Delusions; Percusatory  Suicidal Thoughts:not endorsing Homicidal Thoughts:nor endorsing  Sensorium  Memory: Recent Poor  Judgment: Poor  Insight: Poor   Executive Functions   Concentration: Poor  Attention Span: Poor  Recall: Poor  Fund of Knowledge: Poor  Language: Poor   Psychomotor Activity  Psychomotor Activity:No data recorded Musculoskeletal: Strength & Muscle Tone: within normal limits Gait & Station: normal Assets  Assets: Resilience    Physical Exam: Physical Exam Vitals and nursing note reviewed.    ROS Blood pressure 103/64, pulse 68, temperature 98.1 F (36.7 C), resp. rate 15, height 5' 4 (1.626 m), weight 44.9 kg, SpO2 100%. Body mass index is 16.99 kg/m.  Diagnosis: Active Problems:   Schizophrenia, paranoid (HCC) Paranoid  PLAN: Safety and Monitoring:  -- Involuntary admission to inpatient psychiatric unit for safety, stabilization and treatment  -- Daily contact with patient to assess and evaluate symptoms and progress in treatment  -- Patient's case to be discussed in multi-disciplinary team meeting  -- Observation Level : q15 minute checks  -- Vital signs:  q12 hours  -- Precautions: suicide, elopement, and assault -- Encouraged patient to participate in unit milieu and in scheduled group therapies  2. Psychiatric Diagnoses and Treatment: Patient  2 physicians approved non emergent forced medications legal guardian gave consent for Haldol  oral or IM  Zyprexa  15 mg BID  Haldol  5 mg bid added -DSS gave consent-nonemergent forced medication consented by legal guardian and patient will receive Haldol  5 mg twice daily IM if she refuses oral . Discharge Planning:   -- Social work and case management to assist with discharge planning and identification of hospital follow-up needs prior to discharge  -- Estimated LOS: 3-4 days  Tijuan Dantes, MD 07/11/2024, 4:30 PM

## 2024-07-11 NOTE — Group Note (Signed)
 Date:  07/11/2024 Time:  2:44 PM  Group Topic/Focus:  Early Warning Signs:   The focus of this group is to help patients identify signs or symptoms they exhibit before slipping into an unhealthy state or crisis.    Participation Level:  Did Not Attend    [name] 07/11/2024, 2:44 PM

## 2024-07-11 NOTE — Group Note (Signed)
 LCSW Group Therapy Note   Group Date: 07/11/2024 Start Time: 1300 End Time: 1330   Type of Therapy and Topic:  Group Therapy: Challenging Core Beliefs  Participation Level:  Did Not Attend  Description of Group:  Patients were educated about core beliefs and asked to identify one harmful core belief that they have. Patients were asked to explore from where those beliefs originate. Patients were asked to discuss how those beliefs make them feel and the resulting behaviors of those beliefs. They were then be asked if those beliefs are true and, if so, what evidence they have to support them. Lastly, group members were challenged to replace those negative core beliefs with helpful beliefs.   Therapeutic Goals:   1. Patient will identify harmful core beliefs and explore the origins of such beliefs. 2. Patient will identify feelings and behaviors that result from those core beliefs. 3. Patient will discuss whether such beliefs are true. 4.  Patient will replace harmful core beliefs with helpful ones.  Summary of Patient Progress:  X  Therapeutic Modalities: Cognitive Behavioral Therapy; Solution-Focused Therapy   Binnie Droessler D Eleonora Peeler, [place] 07/11/2024  1:31 PM

## 2024-07-11 NOTE — Group Note (Signed)
 Date:  07/11/2024 Time:  8:29 PM  Group Topic/Focus:  Wrap-Up Group:   The focus of this group is to help patients review their daily goal of treatment and discuss progress on daily workbooks.    Participation Level:  Did Not Attend  Beatris ONEIDA Hasten 07/11/2024, 8:29 PM

## 2024-07-11 NOTE — Plan of Care (Signed)
   Problem: Activity: Goal: Interest or engagement in activities will improve Outcome: Progressing

## 2024-07-11 NOTE — Group Note (Signed)
 Date:  07/11/2024 Time:  4:24 PM  Group Topic/Focus:    Coloring offers geriatric patients significant benefits, including reducing stress and anxiety, improving fine motor skills (dexterity, hand-eye coordination), boosting cognitive function (focus, memory), providing creative self-expression, fighting boredom, and encouraging social interaction, all while fostering a sense of accomplishment and combating loneliness, especially for those with dementia or limited mobility.  Coloring induces a meditative state, channeling focus and interrupting negative thought patterns, which lowers anxiety and calms the mind.It can boost serotonin levels, leading to better moods, and help seniors forget aches and pains temporarily.  Participation Level:  Did Not Attend   [name] 07/11/2024, 4:24 PM

## 2024-07-12 NOTE — BHH Counselor (Signed)
 Placement Update:   Pt currently under ex-part [facility] APS- Etha Chang).   APS currently assisting with placement for pt, group home placement is most ideal.   Currently CSW awaits placement updates from [facility] at this time.   [name] Croft, MSW, [place] 07/12/2024 1:02 PM

## 2024-07-12 NOTE — Progress Notes (Signed)
 Behavior:   Cooperative.  No behavioral issues.    Psych assessment: Denies SI.  Interaction / Group attendance:  No interaction with peers.  Minimal interaction with peers. Attended MHT/RT group.   Medication/ PRNs: Compliant.  Pain:  Denies.  15 min checks in place for safety.

## 2024-07-12 NOTE — Group Note (Signed)
 Recreation Therapy Group Note   Group Topic:Communication  Group Date: 07/12/2024 Start Time: 1250 End Time: 1325 Facilitators: Celestia Jeoffrey BRAVO, LRT, CTRS Location: Dayroom  Group Description: LRT and NT, Leita, passed out large Christmas bags filled with 4-6 individually wrapped presents inside for the patient to unwrap. Christmas music was being played in the background. LRT, NT, and patients discussed past Christmas traditions and fun memories they have made while communicating with one another in the dayroom.   Goal Area(s) addressed: Patient will increase communication.  Patient will reminisce on past memory Patient will practice gratitude.    Affect/Mood: Appropriate and Flat   Participation Level: Active and Engaged   Participation Quality: Independent   Behavior: Calm and Cooperative   Speech/Thought Process: Coherent   Insight: Fair   Judgement: Fair    Modes of Intervention: Activity and Music   Patient Response to Interventions:  Receptive   Education Outcome:  Acknowledges education   Clinical Observations/Individualized Feedback: Shondell was somewhat active in their participation of session activities and group discussion. Pt minimally interacted with LRT and peers while present in group.   Plan: Continue to engage patient in RT group sessions 2-3x/week.   [id number] LRT, CTRS 07/12/2024 1:33 PM

## 2024-07-12 NOTE — Group Note (Signed)
 Date:  07/12/2024 Time:  9:21 PM  Group Topic/Focus:  Wrap-Up Group:   The focus of this group is to help patients review their daily goal of treatment and discuss progress on daily workbooks.    Participation Level:  Did Not Attend  Participation Quality:     Affect:     Cognitive:     Insight: None  Engagement in Group:  None  Modes of Intervention:     Additional Comments:    [name] 07/12/2024, 9:21 PM

## 2024-07-12 NOTE — Group Note (Signed)
 Date:  07/12/2024 Time:  1:56 PM  Group Topic/Focus:  Self Care:   The focus of this group is to help patients understand the importance of self-care in order to improve or restore emotional, physical, spiritual, interpersonal, and financial health.    Participation Level:  Active  Participation Quality:  Appropriate and Attentive  Affect:  Appropriate  Cognitive:  Alert and Appropriate  Insight: Appropriate  Engagement in Group:  Engaged  Modes of Intervention:  Activity, Orientation, and Socialization  Additional Comments:   Christmas party   Maglione,Clent Damore E 07/12/2024, 1:56 PM

## 2024-07-12 NOTE — Plan of Care (Signed)
   Problem: Education: Goal: Knowledge of [name] General Education information/materials will improve Outcome: Progressing Goal: Emotional status will improve Outcome: Progressing Goal: Mental status will improve Outcome: Progressing Goal: Verbalization of understanding the information provided will improve Outcome: Progressing   Problem: Activity: Goal: Interest or engagement in activities will improve Outcome: Progressing Goal: Sleeping patterns will improve Outcome: Progressing   Problem: Coping: Goal: Ability to verbalize frustrations and anger appropriately will improve Outcome: Progressing Goal: Ability to demonstrate self-control will improve Outcome: Progressing

## 2024-07-12 NOTE — Progress Notes (Signed)
 Phone call back [facility] Psychiatric MD Progress Note  07/12/2024 11:54 AM [name]  MRN:  [id number]  [name] is a 63 y.o. female admitted: Medicallyfor 05/02/2024 10:56 AM for dehydration, failure to thrive and hypoglycemia. She carries the psychiatric diagnoses of Schizophrenia and has medical hx of hypoglycemia and failure to thrive and has a past medical history of sinus bradycardia, chronic anemia, severe malnutrition and recurrent hypoglycemia.. Her current presentation of verbal aggression, agitation and refusal to eat with severe paranoia is most consistent with paranoid schizophrenia. [name]  She was at [facility] where she was admitted on 04/30/2024.  She was transferred to the ED because of failure to thrive and was admitted to medical floor.Patient is on IVC and patient has been adamantly refusing all oral medications and refusing to eat.  Patient is admitted to [facility] unit with Q15 min safety monitoring. Multidisciplinary team approach is offered. Medication management; group/milieu therapy is offered.    Subjective:  Chart reviewed, case discussed in multidisciplinary meeting, patient seen during rounds.   It is noted to be standing in her room.  She offers no complaints.  She is minimally engaging with the provider.  Per nursing patient is calm and cooperative taking her oral medications including Haldol .  Patient is showing improvement in responding to internal stimuli.  She is coming out the day area and sitting there for some more time.  But with the provider she remains irritable and refuses to answer questions related to the mental health and keeps repeating I need to go home mam .  Emailed the legal [name], [phone] (desk)/ [phone])  for consent for nonemergent forced medication.  Spoke with [name] on 07/06/2024 and received consent for nonemergent forced medication.  If patient refuses Haldol  oral she will receive IM Haldol . Past Psychiatric History: see  h&P Family History: History reviewed. No pertinent family history. Social History:  Social History   Substance and Sexual Activity  Alcohol Use Not Currently   Comment: Refuses to disclose how much     Social History   Substance and Sexual Activity  Drug Use No   Comment: Refuses to answer    Social History   Socioeconomic History   Marital status: Single    Spouse name: Not on file   Number of children: Not on file   Years of education: Not on file   Highest education level: Not on file  Occupational History   Not on file  Tobacco Use   Smoking status: Every Day    Current packs/day: 2.00    Types: Cigarettes    Passive exposure: Current   Smokeless tobacco: Never  Vaping Use   Vaping status: Never Used  Substance and Sexual Activity   Alcohol use: Not Currently    Comment: Refuses to disclose how much   Drug use: No    Comment: Refuses to answer   Sexual activity: Not Currently    Comment: refused to answer  Other Topics Concern   Not on file  Social History Narrative   Not on file   Social Drivers of Health   Tobacco Use: High Risk (05/06/2024)   Patient History    Smoking Tobacco Use: Every Day    Smokeless Tobacco Use: Never    Passive Exposure: Current  Financial Resource Strain: Not on file  Food Insecurity: Patient Declined (05/06/2024)   Epic    Worried About Programme Researcher, Broadcasting/film/video in the Last Year: Patient declined    [employer] Officer of  Food in the Last Year: Patient declined  Transportation Needs: Patient Declined (05/06/2024)   Epic    Lack of Transportation (Medical): Patient declined    Lack of Transportation (Non-Medical): Patient declined  Physical Activity: Not on file  Stress: Not on file  Social Connections: Not on file  Depression (EYV7-0): Not on file  Alcohol Screen: Low Risk (05/06/2024)   Alcohol Screen    Last Alcohol Screening Score (AUDIT): 0  Housing: Unknown (05/06/2024)   Epic    Unable to Pay for Housing in the Last Year:  Patient declined    Number of Times Moved in the Last Year: Not on file    Homeless in the Last Year: Not on file  Utilities: Patient Declined (05/06/2024)   Epic    Threatened with loss of utilities: Patient declined  Health Literacy: Not on file   Past Medical History:  Past Medical History:  Diagnosis Date   Anemia 10/02/2021   Non compliance w medication regimen    Schizophrenia (HCC)     Past Surgical History:  Procedure Laterality Date   BIOPSY  09/25/2021   Procedure: BIOPSY;  Surgeon: Teressa Toribio SQUIBB, MD;  Location: [facility];  Service: Gastroenterology;;   ESOPHAGOGASTRODUODENOSCOPY (EGD) WITH PROPOFOL  [name] 09/25/2021   Procedure: ESOPHAGOGASTRODUODENOSCOPY (EGD) WITH PROPOFOL ;  Surgeon: Teressa Toribio SQUIBB, MD;  Location: [facility];  Service: Gastroenterology;  Laterality: [name];  With NGT placement    Current Medications: Current Facility-Administered Medications  Medication Dose Route Frequency Provider Last Rate Last Admin   acetaminophen  (TYLENOL ) tablet 650 mg  650 mg Oral Q6H PRN Starkes-Perry, Takia S, FNP       alum & mag hydroxide-simeth (MAALOX/MYLANTA) 200-200-20 MG/5ML suspension 30 mL  30 mL Oral Q4H PRN Starkes-Perry, Takia S, FNP       haloperidol  (HALDOL ) tablet 5 mg  5 mg Oral BID Nidal Rivet, MD   5 mg at 07/12/24 9089   Or   haloperidol  lactate (HALDOL ) injection 10 mg  10 mg Intramuscular BID Caelan Atchley, MD       HYDROcodone -acetaminophen  (NORCO/VICODIN) 5-325 MG per tablet 1-2 tablet  1-2 tablet Oral Q4H PRN Starkes-Perry, Takia S, FNP       magnesium  hydroxide (MILK OF MAGNESIA) suspension 30 mL  30 mL Oral Daily PRN Starkes-Perry, Takia S, FNP       multivitamin with minerals tablet 1 tablet  1 tablet Oral Daily Wilkie Majel RAMAN, FNP   1 tablet at 07/12/24 9089   OLANZapine  (ZYPREXA ) injection 5 mg  5 mg Intramuscular TID PRN Wilkie Majel RAMAN, FNP       OLANZapine  (ZYPREXA ) tablet 15 mg  15 mg Oral BID Willene Holian, MD   15 mg at  07/12/24 9089   OLANZapine  zydis (ZYPREXA ) disintegrating tablet 5 mg  5 mg Oral TID PRN Starkes-Perry, Takia S, FNP       ondansetron  (ZOFRAN ) tablet 4 mg  4 mg Oral Q6H PRN Wilkie Majel RAMAN, FNP       Or   ondansetron  (ZOFRAN ) injection 4 mg  4 mg Intravenous Q6H PRN Starkes-Perry, Majel RAMAN, FNP       thiamine  (VITAMIN B1) tablet 100 mg  100 mg Oral Daily Wilkie Majel RAMAN, FNP   100 mg at 07/12/24 9089   traZODone  (DESYREL ) tablet 50 mg  50 mg Oral QHS Starkes-Perry, Takia S, FNP   50 mg at 07/11/24 2011    Lab Results:  No results found for this or any previous visit (from  the past 48 hours).   Blood Alcohol level:  Lab Results  Component Value Date   [facility] <15 05/02/2024   ETH <15 05/01/2024    Metabolic Disorder Labs: Lab Results  Component Value Date   HGBA1C 5.6 05/14/2024   MPG 114.02 05/14/2024   MPG 116.89 05/02/2024   Lab Results  Component Value Date   PROLACTIN 13.6 04/16/2015   Lab Results  Component Value Date   CHOL 219 (H) 05/14/2024   TRIG 84 05/14/2024   HDL 83 05/14/2024   CHOLHDL 2.6 05/14/2024   VLDL 17 05/14/2024   LDLCALC 119 (H) 05/14/2024   LDLCALC 130 (H) 05/01/2024    Physical Findings: AIMS:  , ,  ,  ,    CIWA:    COWS:      Psychiatric Specialty Exam:  Presentation  General Appearance:  Casual  Eye Contact: Fair  Speech: Normal rate  Speech Volume: Increased    Mood and Affect  Mood: Angry  Affect: Congruent   Thought Process  Thought Processes: Disorganized  Descriptions of Associations:Tangential  Orientation:Partial  Thought Content:Illogical; Tangential  Hallucinations:RESPONDING TO INTERNAL STIMULI Ideas of Reference:Paranoia; Delusions; Percusatory  Suicidal Thoughts:not endorsing Homicidal Thoughts:nor endorsing  Sensorium  Memory: Recent Poor  Judgment: Poor  Insight: Poor   Executive Functions  Concentration: Poor  Attention Span: Poor  Recall: Poor  Fund of  Knowledge: Poor  Language: Poor   Psychomotor Activity  Psychomotor Activity:No data recorded Musculoskeletal: Strength & Muscle Tone: within normal limits Gait & Station: normal Assets  Assets: Resilience    Physical Exam: Physical Exam Vitals and nursing note reviewed.    ROS Blood pressure 106/68, pulse 70, temperature 98.1 F (36.7 C), resp. rate 16, height 5' 4 (1.626 m), weight 44.9 kg, SpO2 100%. Body mass index is 16.99 kg/m.  Diagnosis: Active Problems:   Schizophrenia, paranoid (HCC) Paranoid  PLAN: Safety and Monitoring:  -- Involuntary admission to inpatient psychiatric unit for safety, stabilization and treatment  -- Daily contact with patient to assess and evaluate symptoms and progress in treatment  -- Patient's case to be discussed in multi-disciplinary team meeting  -- Observation Level : q15 minute checks  -- Vital signs:  q12 hours  -- Precautions: suicide, elopement, and assault -- Encouraged patient to participate in unit milieu and in scheduled group therapies  2. Psychiatric Diagnoses and Treatment: Patient  2 physicians approved non emergent forced medications legal guardian gave consent for Haldol  oral or IM  Zyprexa  15 mg BID  Haldol  10 mg bid added -DSS gave consent-nonemergent forced medication consented by legal guardian and patient will receive Haldol  5 mg twice daily IM if she refuses oral . Discharge Planning:   -- Social work and case management to assist with discharge planning and identification of hospital follow-up needs prior to discharge  -- Estimated LOS: 3-4 days  Allyn Foil, MD 07/12/2024, 11:54 AM

## 2024-07-12 NOTE — Plan of Care (Signed)
" °  Problem: Activity: Goal: Interest or engagement in activities will improve Outcome: Not Progressing   Problem: Coping: Goal: Ability to demonstrate self-control will improve Outcome: Progressing   Problem: Health Behavior/Discharge Planning: Goal: Compliance with treatment plan for underlying cause of condition will improve Outcome: Progressing   "

## 2024-07-12 NOTE — Group Note (Signed)
 Date:  07/12/2024 Time:  3:23 PM  Group Topic/Focus:  Goals Group:   The focus of this group is to help patients establish daily goals to achieve during treatment and discuss how the patient can incorporate goal setting into their daily lives to aide in recovery. Managing Feelings:   The focus of this group is to identify what feelings patients have difficulty handling and develop a plan to handle them in a healthier way upon discharge.    Participation Level:  Did Not Attend   [name] 07/12/2024, 3:23 PM

## 2024-07-13 NOTE — Group Note (Signed)
 Date:  07/13/2024 Time:  3:33 PM  Group Topic/Focus:  Self Care:   The focus of this group is to help patients understand the importance of self-care in order to improve or restore emotional, physical, spiritual, interpersonal, and financial health.  Meditation offers significant benefits for geriatric patients, including boosting cognitive health (memory, focus), improving emotional well-being (reducing stress, anxiety, depression, loneliness), managing chronic pain, lowering blood pressure, strengthening the immune system, and enhancing physical balance and flexibility through gentle movements like Tai Chi, all while supporting brain plasticity and overall quality of life as they age.   Participation Level:  Did Not Attend   [name] Gavel 07/13/2024, 3:33 PM

## 2024-07-13 NOTE — Progress Notes (Signed)
" °   07/13/24 0127  Psych Admission Type (Psych Patients Only)  Admission Status Involuntary  Psychosocial Assessment  Patient Complaints None  Eye Contact Avoids  Facial Expression Flat;Blank  Affect Appropriate to circumstance  Speech Soft  Interaction Avoidant;No initiation;Minimal  Motor Activity Pacing;Restless  Appearance/Hygiene In scrubs;Unremarkable  Behavior Characteristics Cooperative;Appropriate to situation  Mood Pleasant  Thought Process  Coherency WDL  Content WDL  Delusions None reported or observed  Perception WDL  Hallucination None reported or observed  Judgment Poor  Confusion Mild  Danger to Self  Current suicidal ideation? Denies  Agreement Not to Harm Self Yes  Description of Agreement Verbal  Danger to Others  Danger to Others None reported or observed    "

## 2024-07-13 NOTE — Progress Notes (Signed)
" °   07/13/24 0810  Psych Admission Type (Psych Patients Only)  Admission Status Involuntary  Psychosocial Assessment  Patient Complaints None  Eye Contact Avoids  Facial Expression Flat;Blank  Affect Appropriate to circumstance  Speech Soft  Interaction Avoidant;No initiation  Motor Activity Slow  Appearance/Hygiene Unremarkable  Behavior Characteristics Cooperative;Appropriate to situation  Mood Pleasant  Aggressive Behavior  Effect No apparent injury  Thought Process  Coherency WDL  Content WDL  Delusions None reported or observed  Perception WDL  Hallucination None reported or observed  Judgment Poor  Confusion Mild  Danger to Self  Current suicidal ideation? Denies  Agreement Not to Harm Self Yes  Description of Agreement Verbal  Danger to Others  Danger to Others None reported or observed    "

## 2024-07-13 NOTE — Plan of Care (Signed)
  Problem: Education: Goal: Knowledge of [place] [facility] Education information/materials will improve Outcome: Progressing Goal: Emotional status will improve Outcome: Progressing Goal: Mental status will improve Outcome: Progressing Goal: Verbalization of understanding the information provided will improve Outcome: Progressing   Problem: Activity: Goal: Interest or engagement in activities will improve Outcome: Progressing Goal: Sleeping patterns will improve Outcome: Progressing   Problem: Coping: Goal: Ability to verbalize frustrations and anger appropriately will improve Outcome: Progressing Goal: Ability to demonstrate self-control will improve Outcome: Progressing   Problem: Health Behavior/Discharge Planning: Goal: Identification of resources available to assist in meeting health care needs will improve Outcome: Progressing Goal: Compliance with treatment plan for underlying cause of condition will improve Outcome: Progressing   Problem: Physical Regulation: Goal: Ability to maintain clinical measurements within normal limits will improve Outcome: Progressing   Problem: Safety: Goal: Periods of time without injury will increase Outcome: Progressing   Problem: Education: Goal: Knowledge of General Education information will improve Description: Including pain rating scale, medication(s)/side effects and non-pharmacologic comfort measures Outcome: Progressing   Problem: Health Behavior/Discharge Planning: Goal: Ability to manage health-related needs will improve Outcome: Progressing   Problem: Clinical Measurements: Goal: Ability to maintain clinical measurements within normal limits will improve Outcome: Progressing Goal: Will remain free from infection Outcome: Progressing Goal: Diagnostic test results will improve Outcome: Progressing Goal: Respiratory complications will improve Outcome: Progressing Goal: Cardiovascular complication will be  avoided Outcome: Progressing   Problem: Activity: Goal: Risk for activity intolerance will decrease Outcome: Progressing   Problem: Nutrition: Goal: Adequate nutrition will be maintained Outcome: Progressing   Problem: Coping: Goal: Level of anxiety will decrease Outcome: Progressing   Problem: Elimination: Goal: Will not experience complications related to bowel motility Outcome: Progressing Goal: Will not experience complications related to urinary retention Outcome: Progressing   Problem: Pain Managment: Goal: General experience of comfort will improve and/or be controlled Outcome: Progressing   Problem: Safety: Goal: Ability to remain free from injury will improve Outcome: Progressing   Problem: Skin Integrity: Goal: Risk for impaired skin integrity will decrease Outcome: Progressing   Problem: Health Behavior/Discharge Planning: Goal: Compliance with prescribed medication regimen will improve Outcome: Progressing   Problem: Nutritional: Goal: Ability to achieve adequate nutritional intake will improve Outcome: Progressing

## 2024-07-13 NOTE — Progress Notes (Signed)
" °   07/13/24 2000  Psych Admission Type (Psych Patients Only)  Admission Status Involuntary  Psychosocial Assessment  Patient Complaints None  Eye Contact Avoids  Facial Expression Flat  Affect Appropriate to circumstance  Speech Soft  Interaction Avoidant  Motor Activity Slow  Appearance/Hygiene Unremarkable  Behavior Characteristics Cooperative;Appropriate to situation  Mood Pleasant  Thought Process  Coherency WDL  Content WDL  Delusions None reported or observed  Perception WDL  Hallucination None reported or observed  Judgment Poor  Confusion Mild  Danger to Self  Current suicidal ideation? Denies  Agreement Not to Harm Self Yes  Description of Agreement verbal  Danger to Others  Danger to Others None reported or observed   Mood/Behavior: Calm and cooperative.   Psych assessment: Denies SI/HI and AVH.     Interaction / Group attendance: Isolative to self. Minimal interaction with peers and staff.  Did not attend group.   Medication/ PRNs: Compliant with scheduled meds. No prns required.     Pain: Denies     15 min checks in place for safety. "

## 2024-07-13 NOTE — Group Note (Signed)
 Date:  07/13/2024 Time:  8:26 PM  Group Topic/Focus:  Goals Group:   The focus of this group is to help patients establish daily goals to achieve during treatment and discuss how the patient can incorporate goal setting into their daily lives to aide in recovery. Personal Choices and Values:   The focus of this group is to help patients assess and explore the importance of values in their lives, how their values affect their decisions, how they express their values and what opposes their expression. Spirituality:   The focus of this group is to discuss how one's spirituality can aide in recovery.    Participation Level:  Did Not Attend  Participation Quality:  Pt Did Not Attend  Affect:  Pt Did Not Attend  Cognitive:  Pt Did Not Attend  Insight: Pt Did Not Attend  Engagement in Group:  Pt Did Not Attend  Modes of Intervention:  Pt Did Not Attend  Additional Comments:  Pt Did Not Attend  Auri Jahnke E Ahlana Slaydon 07/13/2024, 8:26 PM

## 2024-07-13 NOTE — Group Note (Signed)
 [facility] LCSW Group Therapy Note   Group Date: 07/13/2024 Start Time: 1300 End Time: 1400  Type of Therapy/Topic:  Group Therapy:  Feelings about Diagnosis  Participation Level:  Did Not Attend   Mood: Did not attend.    Description of Group:    This group will allow patients to explore their thoughts and feelings about diagnoses they have received. Patients will be guided to explore their level of understanding and acceptance of these diagnoses. Facilitator will encourage patients to process their thoughts and feelings about the reactions of others to their diagnosis, and will guide patients in identifying ways to discuss their diagnosis with significant others in their lives. This group will be process-oriented, with patients participating in exploration of their own experiences as well as giving and receiving support and challenge from other group members.   Therapeutic Goals: 1. Patient will demonstrate understanding of diagnosis as evidence by identifying two or more symptoms of the disorder:  2. Patient will be able to express two feelings regarding the diagnosis 3. Patient will demonstrate ability to communicate their needs through discussion and/or role plays  Summary of Patient Progress:    Patient did not attend.     Therapeutic Modalities:   Cognitive Behavioral Therapy Brief Therapy Feelings Identification    Paula CHRISTELLA Kerns, LCSW

## 2024-07-13 NOTE — Plan of Care (Signed)
 [name] is a 63 y.o. female patient. No diagnosis found. Past Medical History:  Diagnosis Date   Anemia 10/02/2021   Non compliance w medication regimen    Schizophrenia (HCC)    Current Facility-Administered Medications  Medication Dose Route Frequency Provider Last Rate Last Admin   acetaminophen  (TYLENOL ) tablet 650 mg  650 mg Oral Q6H PRN Starkes-Perry, Takia S, FNP       alum & mag hydroxide-simeth (MAALOX/MYLANTA) 200-200-20 MG/5ML suspension 30 mL  30 mL Oral Q4H PRN Starkes-Perry, Takia S, FNP       haloperidol  (HALDOL ) tablet 5 mg  5 mg Oral BID Jadapalle, Sree, MD   5 mg at 07/12/24 2134   Or   haloperidol  lactate (HALDOL ) injection 10 mg  10 mg Intramuscular BID Jadapalle, Sree, MD       HYDROcodone -acetaminophen  (NORCO/VICODIN) 5-325 MG per tablet 1-2 tablet  1-2 tablet Oral Q4H PRN Starkes-Perry, Takia S, FNP       magnesium  hydroxide (MILK OF MAGNESIA) suspension 30 mL  30 mL Oral Daily PRN Starkes-Perry, Takia S, FNP       multivitamin with minerals tablet 1 tablet  1 tablet Oral Daily Wilkie Majel RAMAN, FNP   1 tablet at 07/12/24 9089   OLANZapine  (ZYPREXA ) injection 5 mg  5 mg Intramuscular TID PRN Wilkie Majel RAMAN, FNP       OLANZapine  (ZYPREXA ) tablet 15 mg  15 mg Oral BID Jadapalle, Sree, MD   15 mg at 07/12/24 2135   OLANZapine  zydis (ZYPREXA ) disintegrating tablet 5 mg  5 mg Oral TID PRN Starkes-Perry, Takia S, FNP       ondansetron  (ZOFRAN ) tablet 4 mg  4 mg Oral Q6H PRN Starkes-Perry, Majel RAMAN, FNP       Or   ondansetron  (ZOFRAN ) injection 4 mg  4 mg Intravenous Q6H PRN Starkes-Perry, Majel RAMAN, FNP       thiamine  (VITAMIN B1) tablet 100 mg  100 mg Oral Daily Wilkie Majel RAMAN, FNP   100 mg at 07/12/24 0910   traZODone  (DESYREL ) tablet 50 mg  50 mg Oral QHS Starkes-Perry, Takia S, FNP   50 mg at 07/12/24 2134   Allergies[1] Active Problems:   Schizophrenia, paranoid (HCC)  Blood pressure (!) 90/52, pulse 71, temperature 98.6 F (37 C), resp.  rate 14, height 5' 4 (1.626 m), weight 44.9 kg, SpO2 98%.    Kinsler Soeder B Sarya Linenberger 07/13/2024      [1]  Allergies Allergen Reactions   Aspirin Hives, Nausea Only and Other (See Comments)    Possibly caused hives, per sister and caused STOMACH UPSET   Ziprasidone Hcl Hives, Rash and Other (See Comments)    GEODON = Caused twitching and redness of eyes (also)

## 2024-07-13 NOTE — Progress Notes (Signed)
 Phone call back [facility] MD Progress Note  07/13/2024 12:24 PM [name]  MRN:  [id number]  [name] is a 63 y.o. female admitted: Medicallyfor 05/02/2024 10:56 AM for dehydration, failure to thrive and hypoglycemia. She carries the psychiatric diagnoses of Schizophrenia and has medical hx of hypoglycemia and failure to thrive and has a past medical history of sinus bradycardia, chronic anemia, severe malnutrition and recurrent hypoglycemia.. Her current presentation of verbal aggression, agitation and refusal to eat with severe paranoia is most consistent with paranoid schizophrenia. [name]  [facility] was at [name] where she was admitted on 04/30/2024.  She was transferred to the ED because of failure to thrive and was admitted to medical floor.Patient is on IVC and patient has been adamantly refusing all oral medications and refusing to eat.  Patient is admitted to [name] unit with Q15 min safety monitoring. Multidisciplinary team approach is offered. Medication management; group/milieu therapy is offered.    Subjective:  Chart reviewed, case discussed in multidisciplinary meeting, patient seen during rounds.   Patient is noted to be standing in her room.  Per nursing staff patient is taking her medications saying she is being forced to take the medicine.  Patient is noted to be visible on the unit.  With this provider she remains hostile and paranoid stating that she needs to go home.  She remains disengaging to discuss about her family or the place that she wants to go to after discharge.  Provider tried to explain the legal guardian process but patient was dismissive and asked the provider to leave the room.  Emailed the legal [name], [phone] (desk)/ [phone])  for consent for nonemergent forced medication.  Spoke with [name] on 07/06/2024 and received consent for nonemergent forced medication.  If patient refuses Haldol  oral she will receive IM Haldol . Past Psychiatric  History: see h&P Family History: History reviewed. No pertinent family history. Social History:  Social History   Substance and Sexual Activity  Alcohol Use Not Currently   Comment: Refuses to disclose how much     Social History   Substance and Sexual Activity  Drug Use No   Comment: Refuses to answer    Social History   Socioeconomic History   Marital status: Single    Spouse name: Not on file   Number of children: Not on file   Years of education: Not on file   Highest education level: Not on file  Occupational History   Not on file  Tobacco Use   Smoking status: Every Day    Current packs/day: 2.00    Types: Cigarettes    Passive exposure: Current   Smokeless tobacco: Never  Vaping Use   Vaping status: Never Used  Substance and Sexual Activity   Alcohol use: Not Currently    Comment: Refuses to disclose how much   Drug use: No    Comment: Refuses to answer   Sexual activity: Not Currently    Comment: refused to answer  Other Topics Concern   Not on file  Social History Narrative   Not on file   Social Drivers of Health   Tobacco Use: High Risk (05/06/2024)   Patient History    Smoking Tobacco Use: Every Day    Smokeless Tobacco Use: Never    Passive Exposure: Current  Financial Resource Strain: Not on file  Food Insecurity: Patient Declined (05/06/2024)   Epic    Worried About Programme Researcher, Broadcasting/film/video in the Last Year: Patient declined  Ran Out of Food in the Last Year: Patient declined  Transportation Needs: Patient Declined (05/06/2024)   Epic    Lack of Transportation (Medical): Patient declined    Lack of Transportation (Non-Medical): Patient declined  Physical Activity: Not on file  Stress: Not on file  Social Connections: Not on file  Depression (EYV7-0): Not on file  Alcohol Screen: Low Risk (05/06/2024)   Alcohol Screen    Last Alcohol Screening Score (AUDIT): 0  Housing: Unknown (05/06/2024)   Epic    Unable to Pay for Housing in the  Last Year: Patient declined    Number of Times Moved in the Last Year: Not on file    Homeless in the Last Year: Not on file  Utilities: Patient Declined (05/06/2024)   Epic    Threatened with loss of utilities: Patient declined  Health Literacy: Not on file   Past Medical History:  Past Medical History:  Diagnosis Date   Anemia 10/02/2021   Non compliance w medication regimen    Schizophrenia (HCC)     Past Surgical History:  Procedure Laterality Date   BIOPSY  09/25/2021   Procedure: BIOPSY;  Surgeon: Teressa Toribio SQUIBB, MD;  Location: [facility];  Service: Gastroenterology;;   ESOPHAGOGASTRODUODENOSCOPY (EGD) WITH PROPOFOL  [name] 09/25/2021   Procedure: ESOPHAGOGASTRODUODENOSCOPY (EGD) WITH PROPOFOL ;  Surgeon: Teressa Toribio SQUIBB, MD;  Location: [facility];  Service: Gastroenterology;  Laterality: [name];  With NGT placement    Current Medications: Current Facility-Administered Medications  Medication Dose Route Frequency Provider Last Rate Last Admin   acetaminophen  (TYLENOL ) tablet 650 mg  650 mg Oral Q6H PRN Starkes-Perry, Takia S, FNP       alum & mag hydroxide-simeth (MAALOX/MYLANTA) 200-200-20 MG/5ML suspension 30 mL  30 mL Oral Q4H PRN Starkes-Perry, Takia S, FNP       haloperidol  (HALDOL ) tablet 5 mg  5 mg Oral BID Tavie Haseman, MD   5 mg at 07/13/24 9189   Or   haloperidol  lactate (HALDOL ) injection 10 mg  10 mg Intramuscular BID Amelita Risinger, MD       HYDROcodone -acetaminophen  (NORCO/VICODIN) 5-325 MG per tablet 1-2 tablet  1-2 tablet Oral Q4H PRN Starkes-Perry, Takia S, FNP       magnesium  hydroxide (MILK OF MAGNESIA) suspension 30 mL  30 mL Oral Daily PRN Starkes-Perry, Takia S, FNP       multivitamin with minerals tablet 1 tablet  1 tablet Oral Daily Starkes-Perry, Takia S, FNP   1 tablet at 07/13/24 9188   OLANZapine  (ZYPREXA ) injection 5 mg  5 mg Intramuscular TID PRN Wilkie Majel RAMAN, FNP       OLANZapine  (ZYPREXA ) tablet 15 mg  15 mg Oral BID Elle Vezina, MD    15 mg at 07/13/24 0810   OLANZapine  zydis (ZYPREXA ) disintegrating tablet 5 mg  5 mg Oral TID PRN Starkes-Perry, Takia S, FNP       ondansetron  (ZOFRAN ) tablet 4 mg  4 mg Oral Q6H PRN Starkes-Perry, Majel RAMAN, FNP       Or   ondansetron  (ZOFRAN ) injection 4 mg  4 mg Intravenous Q6H PRN Starkes-Perry, Majel RAMAN, FNP       thiamine  (VITAMIN B1) tablet 100 mg  100 mg Oral Daily Starkes-Perry, Takia S, FNP   100 mg at 07/13/24 1224   traZODone  (DESYREL ) tablet 50 mg  50 mg Oral QHS Starkes-Perry, Takia S, FNP   50 mg at 07/12/24 2134    Lab Results:  No results found for this or any  previous visit (from the past 48 hours).   Blood Alcohol level:  Lab Results  Component Value Date   [facility] <15 05/02/2024   ETH <15 05/01/2024    Metabolic Disorder Labs: Lab Results  Component Value Date   HGBA1C 5.6 05/14/2024   MPG 114.02 05/14/2024   MPG 116.89 05/02/2024   Lab Results  Component Value Date   PROLACTIN 13.6 04/16/2015   Lab Results  Component Value Date   CHOL 219 (H) 05/14/2024   TRIG 84 05/14/2024   HDL 83 05/14/2024   CHOLHDL 2.6 05/14/2024   VLDL 17 05/14/2024   LDLCALC 119 (H) 05/14/2024   LDLCALC 130 (H) 05/01/2024    Physical Findings: AIMS:  , ,  ,  ,    CIWA:    COWS:      Psychiatric Specialty Exam:  Presentation  General Appearance:  Casual  Eye Contact: Fair  Speech: Normal rate  Speech Volume: Increased    Mood and Affect  Mood: Angry  Affect: Congruent   Thought Process  Thought Processes: Disorganized  Descriptions of Associations:Tangential  Orientation:Partial  Thought Content:Illogical; Tangential  Hallucinations:RESPONDING TO INTERNAL STIMULI Ideas of Reference:Paranoia; Delusions; Percusatory  Suicidal Thoughts:not endorsing Homicidal Thoughts:nor endorsing  Sensorium  Memory: Recent Poor  Judgment: Poor  Insight: Poor   Executive Functions  Concentration: Poor  Attention  Span: Poor  Recall: Poor  Fund of Knowledge: Poor  Language: Poor   Psychomotor Activity  Psychomotor Activity:No data recorded Musculoskeletal: Strength & Muscle Tone: within normal limits Gait & Station: normal Assets  Assets: Resilience    Physical Exam: Physical Exam Vitals and nursing note reviewed.    ROS Blood pressure 91/61, pulse 61, temperature 98 F (36.7 C), resp. rate 14, height 5' 4 (1.626 m), weight 44.9 kg, SpO2 100%. Body mass index is 16.99 kg/m.  Diagnosis: Active Problems:   Schizophrenia, paranoid (HCC) Paranoid  PLAN: Safety and Monitoring:  -- Involuntary admission to inpatient psychiatric unit for safety, stabilization and treatment  -- Daily contact with patient to assess and evaluate symptoms and progress in treatment  -- Patient's case to be discussed in multi-disciplinary team meeting  -- Observation Level : q15 minute checks  -- Vital signs:  q12 hours  -- Precautions: suicide, elopement, and assault -- Encouraged patient to participate in unit milieu and in scheduled group therapies  2. Psychiatric Diagnoses and Treatment: Patient  2 physicians approved non emergent forced medications legal guardian gave consent for Haldol  oral or IM  Zyprexa  15 mg BID  Haldol  10 mg bid added - Plan for LAI haldol  Dec 100mg  Q4 weeks DSS gave consent-nonemergent forced medication consented by legal guardian and patient will receive Haldol  5 mg twice daily IM if she refuses oral . Discharge Planning:   -- Social work and case management to assist with discharge planning and identification of hospital follow-up needs prior to discharge  -- Estimated LOS: 3-4 days  Allyn Foil, MD 07/13/2024, 12:24 PM

## 2024-07-13 NOTE — Group Note (Signed)
 Date:  07/13/2024 Time:  10:33 AM  Group Topic/Focus:  Christmas Movie     Participation Level:  Did Not Attend    [name] 07/13/2024, 10:33 AM

## 2024-07-14 NOTE — Plan of Care (Signed)
" °  Problem: Activity: Goal: Interest or engagement in activities will improve Outcome: Progressing   Problem: Coping: Goal: Ability to demonstrate self-control will improve Outcome: Progressing   Problem: Coping: Goal: Ability to verbalize frustrations and anger appropriately will improve Outcome: Not Progressing   "

## 2024-07-14 NOTE — Progress Notes (Signed)
 Phone call back [facility] MD Progress Note  07/14/2024 12:17 PM [name]  MRN:  [id number]  [name] is a 63 y.o. female admitted: Medicallyfor 05/02/2024 10:56 AM for dehydration, failure to thrive and hypoglycemia. She carries the psychiatric diagnoses of Schizophrenia and has medical hx of hypoglycemia and failure to thrive and has a past medical history of sinus bradycardia, chronic anemia, severe malnutrition and recurrent hypoglycemia.. Her current presentation of verbal aggression, agitation and refusal to eat with severe paranoia is most consistent with paranoid schizophrenia. [name]  She was at [facility] where she was admitted on 04/30/2024.  She was transferred to the ED because of failure to thrive and was admitted to medical floor.Patient is on IVC and patient has been adamantly refusing all oral medications and refusing to eat.  Patient is admitted to [facility] unit with Q15 min safety monitoring. Multidisciplinary team approach is offered. Medication management; group/milieu therapy is offered.    Subjective:  Chart reviewed, case discussed in multidisciplinary meeting, patient seen during rounds.    Patient is noted to be walking in her room.  Provider asked about her personal hygiene and patient got upset stating all the provider needs to know is to get her out of the hospital.  Per nursing patient refuses to change her clothes or take shower.  She is coming out of her room to have her meals and her room looks very clean.  Patient reports fair appetite and sleep but continues to refuse to answer any questions related to hallucinations or SI/HI/plan.  She remains calm on the unit with no behavioral outbursts.  She took her medicine today with no reported side effects. Emailed the legal Tess Chang, [phone] (desk)/ [phone])  for consent for nonemergent forced medication.  Spoke with Ms. Draper on 07/06/2024 and received consent for nonemergent forced medication.  If patient  refuses Haldol  oral she will receive IM Haldol . Past Psychiatric History: see h&P Family History: History reviewed. No pertinent family history. Social History:  Social History   Substance and Sexual Activity  Alcohol Use Not Currently   Comment: Refuses to disclose how much     Social History   Substance and Sexual Activity  Drug Use No   Comment: Refuses to answer    Social History   Socioeconomic History   Marital status: Single    Spouse name: Not on file   Number of children: Not on file   Years of education: Not on file   Highest education level: Not on file  Occupational History   Not on file  Tobacco Use   Smoking status: Every Day    Current packs/day: 2.00    Types: Cigarettes    Passive exposure: Current   Smokeless tobacco: Never  Vaping Use   Vaping status: Never Used  Substance and Sexual Activity   Alcohol use: Not Currently    Comment: Refuses to disclose how much   Drug use: No    Comment: Refuses to answer   Sexual activity: Not Currently    Comment: refused to answer  Other Topics Concern   Not on file  Social History Narrative   Not on file   Social Drivers of Health   Tobacco Use: High Risk (05/06/2024)   Patient History    Smoking Tobacco Use: Every Day    Smokeless Tobacco Use: Never    Passive Exposure: Current  Financial Resource Strain: Not on file  Food Insecurity: Patient Declined (05/06/2024)   Epic  Worried About Programme Researcher, Broadcasting/film/video in the Last Year: Patient declined    Barista in the Last Year: Patient declined  Transportation Needs: Patient Declined (05/06/2024)   Epic    Lack of Transportation (Medical): Patient declined    Lack of Transportation (Non-Medical): Patient declined  Physical Activity: Not on file  Stress: Not on file  Social Connections: Not on file  Depression (PHQ2-9): Not on file  Alcohol Screen: Low Risk (05/06/2024)   Alcohol Screen    Last Alcohol Screening Score (AUDIT): 0  Housing:  Unknown (05/06/2024)   Epic    Unable to Pay for Housing in the Last Year: Patient declined    Number of Times Moved in the Last Year: Not on file    Homeless in the Last Year: Not on file  Utilities: Patient Declined (05/06/2024)   Epic    Threatened with loss of utilities: Patient declined  Health Literacy: Not on file   Past Medical History:  Past Medical History:  Diagnosis Date   Anemia 10/02/2021   Non compliance w medication regimen    Schizophrenia (HCC)     Past Surgical History:  Procedure Laterality Date   BIOPSY  09/25/2021   Procedure: BIOPSY;  Surgeon: Teressa Toribio SQUIBB, MD;  Location: [facility];  Service: Gastroenterology;;   ESOPHAGOGASTRODUODENOSCOPY (EGD) WITH PROPOFOL  [name] 09/25/2021   Procedure: ESOPHAGOGASTRODUODENOSCOPY (EGD) WITH PROPOFOL ;  Surgeon: Teressa Toribio SQUIBB, MD;  Location: [facility];  Service: Gastroenterology;  Laterality: [name];  With NGT placement    Current Medications: Current Facility-Administered Medications  Medication Dose Route Frequency Provider Last Rate Last Admin   acetaminophen  (TYLENOL ) tablet 650 mg  650 mg Oral Q6H PRN Starkes-Perry, Takia S, FNP       alum & mag hydroxide-simeth (MAALOX/MYLANTA) 200-200-20 MG/5ML suspension 30 mL  30 mL Oral Q4H PRN Starkes-Perry, Takia S, FNP       haloperidol  (HALDOL ) tablet 5 mg  5 mg Oral BID Alphons Burgert, MD   5 mg at 07/14/24 9148   Or   haloperidol  lactate (HALDOL ) injection 10 mg  10 mg Intramuscular BID Nivek Powley, MD       HYDROcodone -acetaminophen  (NORCO/VICODIN) 5-325 MG per tablet 1-2 tablet  1-2 tablet Oral Q4H PRN Starkes-Perry, Takia S, FNP       magnesium  hydroxide (MILK OF MAGNESIA) suspension 30 mL  30 mL Oral Daily PRN Starkes-Perry, Takia S, FNP       multivitamin with minerals tablet 1 tablet  1 tablet Oral Daily Wilkie Majel RAMAN, FNP   1 tablet at 07/14/24 9148   OLANZapine  (ZYPREXA ) injection 5 mg  5 mg Intramuscular TID PRN Wilkie Majel RAMAN, FNP        OLANZapine  (ZYPREXA ) tablet 15 mg  15 mg Oral BID Vannia Pola, MD   15 mg at 07/14/24 0850   OLANZapine  zydis (ZYPREXA ) disintegrating tablet 5 mg  5 mg Oral TID PRN Starkes-Perry, Takia S, FNP       ondansetron  (ZOFRAN ) tablet 4 mg  4 mg Oral Q6H PRN Starkes-Perry, Majel RAMAN, FNP       Or   ondansetron  (ZOFRAN ) injection 4 mg  4 mg Intravenous Q6H PRN Starkes-Perry, Takia S, FNP       thiamine  (VITAMIN B1) tablet 100 mg  100 mg Oral Daily Wilkie Majel RAMAN, FNP   100 mg at 07/14/24 9148   traZODone  (DESYREL ) tablet 50 mg  50 mg Oral QHS Starkes-Perry, Takia S, FNP   50 mg at  07/13/24 2122    Lab Results:  No results found for this or any previous visit (from the past 48 hours).   Blood Alcohol level:  Lab Results  Component Value Date   [facility] <15 05/02/2024   ETH <15 05/01/2024    Metabolic Disorder Labs: Lab Results  Component Value Date   HGBA1C 5.6 05/14/2024   MPG 114.02 05/14/2024   MPG 116.89 05/02/2024   Lab Results  Component Value Date   PROLACTIN 13.6 04/16/2015   Lab Results  Component Value Date   CHOL 219 (H) 05/14/2024   TRIG 84 05/14/2024   HDL 83 05/14/2024   CHOLHDL 2.6 05/14/2024   VLDL 17 05/14/2024   LDLCALC 119 (H) 05/14/2024   LDLCALC 130 (H) 05/01/2024    Physical Findings: AIMS:  , ,  ,  ,    CIWA:    COWS:      Psychiatric Specialty Exam:  Presentation  General Appearance:  Casual  Eye Contact: Fair  Speech: Normal rate  Speech Volume: Increased    Mood and Affect  Mood: Angry  Affect: Congruent   Thought Process  Thought Processes: Disorganized  Descriptions of Associations:Tangential  Orientation:Partial  Thought Content:Illogical; Tangential  Hallucinations:RESPONDING TO INTERNAL STIMULI Ideas of Reference:Paranoia; Delusions; Percusatory  Suicidal Thoughts:not endorsing Homicidal Thoughts:nor endorsing  Sensorium  Memory: Recent Poor  Judgment: Poor  Insight: Poor   Executive  Functions  Concentration: Poor  Attention Span: Poor  Recall: Poor  Fund of Knowledge: Poor  Language: Poor   Psychomotor Activity  Psychomotor Activity:No data recorded Musculoskeletal: Strength & Muscle Tone: within normal limits Gait & Station: normal Assets  Assets: Resilience    Physical Exam: Physical Exam Vitals and nursing note reviewed.    ROS Blood pressure 117/68, pulse (!) 56, temperature (!) 97.3 F (36.3 C), resp. rate 12, height 5' 4 (1.626 m), weight 44.9 kg, SpO2 100%. Body mass index is 16.99 kg/m.  Diagnosis: Active Problems:   Schizophrenia, paranoid (HCC) Paranoid  PLAN: Safety and Monitoring:  -- Involuntary admission to inpatient psychiatric unit for safety, stabilization and treatment  -- Daily contact with patient to assess and evaluate symptoms and progress in treatment  -- Patient's case to be discussed in multi-disciplinary team meeting  -- Observation Level : q15 minute checks  -- Vital signs:  q12 hours  -- Precautions: suicide, elopement, and assault -- Encouraged patient to participate in unit milieu and in scheduled group therapies  2. Psychiatric Diagnoses and Treatment: Patient  2 physicians approved non emergent forced medications legal guardian gave consent for Haldol  oral or IM  Zyprexa  15 mg BID  Haldol  10 mg bid added - Plan for LAI haldol  Dec 100mg  Q4 weeks DSS gave consent-nonemergent forced medication consented by legal guardian and patient will receive Haldol  5 mg twice daily IM if she refuses oral . Discharge Planning:   -- Social work and case management to assist with discharge planning and identification of hospital follow-up needs prior to discharge  -- Estimated LOS: 3-4 days  Allyn Foil, MD 07/14/2024, 12:17 PM

## 2024-07-14 NOTE — BH IP Treatment Plan (Signed)
 Interdisciplinary Treatment and Diagnostic Plan Update  07/14/2024 Time of Session: 2:45PM [name] MRN: [id number]  Principal Diagnosis: <principal problem not specified>  Secondary Diagnoses: Active Problems:   Schizophrenia, paranoid (HCC)   Current Medications:  Current Facility-Administered Medications  Medication Dose Route Frequency Provider Last Rate Last Admin   acetaminophen  (TYLENOL ) tablet 650 mg  650 mg Oral Q6H PRN Starkes-Perry, Takia S, FNP       alum & mag hydroxide-simeth (MAALOX/MYLANTA) 200-200-20 MG/5ML suspension 30 mL  30 mL Oral Q4H PRN Starkes-Perry, Takia S, FNP       haloperidol  (HALDOL ) tablet 5 mg  5 mg Oral BID Jadapalle, Sree, MD   5 mg at 07/14/24 9148   Or   haloperidol  lactate (HALDOL ) injection 10 mg  10 mg Intramuscular BID Jadapalle, Sree, MD       HYDROcodone -acetaminophen  (NORCO/VICODIN) 5-325 MG per tablet 1-2 tablet  1-2 tablet Oral Q4H PRN Starkes-Perry, Takia S, FNP       magnesium  hydroxide (MILK OF MAGNESIA) suspension 30 mL  30 mL Oral Daily PRN Starkes-Perry, Takia S, FNP       multivitamin with minerals tablet 1 tablet  1 tablet Oral Daily Wilkie Majel RAMAN, FNP   1 tablet at 07/14/24 9148   OLANZapine  (ZYPREXA ) injection 5 mg  5 mg Intramuscular TID PRN Wilkie Majel RAMAN, FNP       OLANZapine  (ZYPREXA ) tablet 15 mg  15 mg Oral BID Jadapalle, Sree, MD   15 mg at 07/14/24 9149   OLANZapine  zydis (ZYPREXA ) disintegrating tablet 5 mg  5 mg Oral TID PRN Wilkie Majel RAMAN, FNP       ondansetron  (ZOFRAN ) tablet 4 mg  4 mg Oral Q6H PRN Wilkie Majel RAMAN, FNP       Or   ondansetron  (ZOFRAN ) injection 4 mg  4 mg Intravenous Q6H PRN Starkes-Perry, Majel RAMAN, FNP       thiamine  (VITAMIN B1) tablet 100 mg  100 mg Oral Daily Wilkie Majel RAMAN, FNP   100 mg at 07/14/24 9148   traZODone  (DESYREL ) tablet 50 mg  50 mg Oral QHS Starkes-Perry, Takia S, FNP   50 mg at 07/13/24 2122   PTA Medications: Medications Prior to Admission   Medication Sig Dispense Refill Last Dose/Taking   feeding supplement (ENSURE PLUS HIGH PROTEIN) LIQD Take 237 mLs by mouth 3 (three) times daily between meals.      Multiple Vitamin (MULTIVITAMIN WITH MINERALS) TABS tablet Take 1 tablet by mouth daily.      OLANZapine  (ZYPREXA ) 5 MG tablet Take 1 tablet (5 mg total) by mouth 2 (two) times daily.      OLANZapine  (ZYPREXA ) injection Inject 1 mL (5 mg total) into the muscle 2 (two) times daily.      thiamine  (VITAMIN B-1) 100 MG tablet Take 1 tablet (100 mg total) by mouth daily.      traZODone  (DESYREL ) 50 MG tablet Take 1 tablet (50 mg total) by mouth at bedtime.       Patient Stressors:    Patient Strengths:    Treatment Modalities: Medication Management, Group therapy, Case management,  1 to 1 session with clinician, Psychoeducation, Recreational therapy.   Physician Treatment Plan for Primary Diagnosis: <principal problem not specified> Long Term Goal(s): Improvement in symptoms so as ready for discharge   Short Term Goals: Ability to identify changes in lifestyle to reduce recurrence of condition will improve  Medication Management: Evaluate patient's response, side effects, and tolerance of medication regimen.  Therapeutic  Interventions: 1 to 1 sessions, Unit Group sessions and Medication administration.  Evaluation of Outcomes: Progressing  Physician Treatment Plan for Secondary Diagnosis: Active Problems:   Schizophrenia, paranoid (HCC)  Long Term Goal(s): Improvement in symptoms so as ready for discharge   Short Term Goals: Ability to identify changes in lifestyle to reduce recurrence of condition will improve     Medication Management: Evaluate patient's response, side effects, and tolerance of medication regimen.  Therapeutic Interventions: 1 to 1 sessions, Unit Group sessions and Medication administration.  Evaluation of Outcomes: Progressing   RN Treatment Plan for Primary Diagnosis: <principal problem not  specified> Long Term Goal(s): Knowledge of disease and therapeutic regimen to maintain health will improve  Short Term Goals: Ability to demonstrate self-control, Ability to participate in decision making will improve, Ability to verbalize feelings will improve, Ability to disclose and discuss suicidal ideas, Ability to identify and develop effective coping behaviors will improve, and Compliance with prescribed medications will improve  Medication Management: RN will administer medications as ordered by provider, will assess and evaluate patient's response and provide education to patient for prescribed medication. RN will report any adverse and/or side effects to prescribing provider.  Therapeutic Interventions: 1 on 1 counseling sessions, Psychoeducation, Medication administration, Evaluate responses to treatment, Monitor vital signs and CBGs as ordered, Perform/monitor CIWA, COWS, AIMS and Fall Risk screenings as ordered, Perform wound care treatments as ordered.  Evaluation of Outcomes: Progressing   LCSW Treatment Plan for Primary Diagnosis: <principal problem not specified> Long Term Goal(s): Safe transition to appropriate next level of care at discharge, Engage patient in therapeutic group addressing interpersonal concerns.  Short Term Goals: Engage patient in aftercare planning with referrals and resources, Increase social support, Increase ability to appropriately verbalize feelings, Increase emotional regulation, Facilitate acceptance of mental health diagnosis and concerns, and Increase skills for wellness and recovery  Therapeutic Interventions: Assess for all discharge needs, 1 to 1 time with Social worker, Explore available resources and support systems, Assess for adequacy in community support network, Educate family and significant other(s) on suicide prevention, Complete Psychosocial Assessment, Interpersonal group therapy.  Evaluation of Outcomes: Progressing   Progress in  Treatment: Attending groups: No. Participating in groups: No. Taking medication as prescribed: Yes. Toleration medication: Yes. Family/Significant other contact made: No, will contact:  contact has been made with DSS as well as patients brother. Patient understands diagnosis: No. Discussing patient identified problems/goals with staff: No. Medical problems stabilized or resolved: Yes. Denies suicidal/homicidal ideation: Yes. Issues/concerns per patient self-inventory: No. Other: none  New problem(s) identified: None identified. Update: 05/13/2024 No changes at this time. Update 05/18/2024:  No changes at this time. Update 05/23/2024:  No changes at this time. Update 05/28/2024:  No changes at this time. Update 06/02/2024:  No changes at this time. Update 06/07/2024:  No changes at this time. 06/12/24 Update: Patient has been isolative to her room with minimal interaction. Update 06/18/24:  No changes at this time  Update 06/24/24:  No changes at this time Update 06/29/24:  No changes at this time 07/04/24 Update: No changes. 07/09/24 07/14/2024: No changes at this time.     New Short Term/Long Term Goal(s): elimination of symptoms of psychosis, medication management for mood stabilization; elimination of SI thoughts; development of comprehensive mental wellness plan. Update: 05/13/2024 No changes at this time.Update: 05/18/2024 No changes at this time. Update 05/23/2024:  No changes at this time. Update 05/28/2024:  No changes at this time. Update 06/02/2024:  No changes at this  time. Update 06/07/2024:  No changes at this time. 06/12/24 Update: No changes at this time. Update 06/18/24:  No changes at this time  Update 06/24/24:  No changes at this time Update 06/29/24:  No changes at this time 07/04/24 Update: No changes. 07/09/24: No change 07/14/2024: No changes at this time.    Patient Goals:   I just need to leave, I don't need it hereUpdate: 05/13/2024 No changes at this time.Update:  05/18/2024 No changes at this time. Update 05/23/2024:  No changes at this time.Update 05/28/2024:  No changes at this time. Update 06/02/2024:  No changes at this time. Update 06/07/2024:  No changes at this time. 06/12/24 Update: No changes at this time. Update 06/18/24:  No changes at this time Update 06/24/24:  No changes at this time Update 06/29/24:  No changes at this time 07/04/24 Update: No changes. 07/09/24: No changes at this time.  07/14/2024: No changes at this time.     Discharge Plan or Barriers: CSW will assist with appropriate discharge planning. Update: 05/13/2024 No changes at this time.Update: 05/18/2024 No changes at this time. Update 05/23/2024:  No changes at this time.Update 05/28/2024:  CSW unable to identify appropriate decision maker, as pt's legal guardian has passed and CSW has reached out to APS caseworker multiple times to get clarity on protocol for enforcing medication until an appropriate party has been legal identified Update 06/02/2024:  No changes at this time. Update 06/07/2024:  CSW and provider still working to clarify appropriate horticulturist, commercial. Please refer to extensive notes in medical record. 06/12/24 Update: CSW and provider still working with DSS to obtain legal decision maker on patient's behalf. Update 06/18/24:  No changes at this time Update 06/24/24:  DSS received ex-parte, DSS to begin looking for placement, Dr.J to initiate enforced medication Update 06/29/24:  No changes at this time 07/04/24 Update: CSW to continue to provide support to APS case worker while DSS works on clinical cytogeneticist. No changes. 07/09/24 07/14/2024: Possible bed at [facility], CSW team to follow up with guardian regarding this.    Reason for Continuation of Hospitalization: Aggression Hallucinations   Estimated Length of Stay:1 to 7 days. Update: 05/13/2024 TBDUpdate: 05/18/2024 TBD Update 05/23/2024:  TBD Update 05/28/2024:  TBDUpdate 06/02/2024:  TBD Update  06/07/2024:  TBD 06/12/24 Update: TBD. Update 06/18/24:  TBD Update 06/24/24:  TBD Update 06/29/24:  TBD 07/04/24 Update: TBD.Update,  07/09/24 07/14/2024: TBD  Last 3 [place] Suicide Severity Risk Score: Flowsheet Row Admission (Current) from 05/06/2024 in [facility] ED to Hosp-Admission (Discharged) from 05/02/2024 in [facility] from 04/30/2024 in [facility]  C-SSRS RISK CATEGORY No Risk No Risk No Risk    Last [facility] 2/9 Scores:     No data to display          Scribe for Treatment Team: Sherryle JINNY Paola KEN 07/14/2024 3:21 PM

## 2024-07-14 NOTE — Group Note (Signed)
 Date:  07/14/2024 Time:  10:31 AM  Group Topic/Focus:  Movement Therapy, Morning Stretch with Jumana Paccione.    Participation Level:  Did Not Attend    [name] 07/14/2024, 10:31 AM

## 2024-07-14 NOTE — BHH Counselor (Signed)
 CSW received call from Cierra Singletary, [facility], [phone].  She reports that DSS sent her the [facility] and she would like to meet with the patient.   CSW assisted the pt in meeting with Cierra.  Patient attended for a brief time.  Patient displayed disorganized thinking, discussing her parole violation and rights.  Incident ended without issue.  Cierra requested that primary CSW follow up on Monday 07/17/2024.  Cierra reports that patient may be a good fit and bed is available.   Sherryle Margo, MSW, LCSW 07/14/2024 3:19 PM

## 2024-07-14 NOTE — Group Note (Signed)
 Date:  07/14/2024 Time:  9:17 PM  Group Topic/Focus:  Wrap-Up Group:   The focus of this group is to help patients review their daily goal of treatment and discuss progress on daily workbooks.    Participation Level:  Did Not Attend  Participation Quality:     Affect:     Cognitive:     Insight: None  Engagement in Group:  None  Modes of Intervention:     Additional Comments:    [name] 07/14/2024, 9:17 PM

## 2024-07-14 NOTE — Group Note (Signed)
 Date:  07/14/2024 Time:  3:44 PM  Group Topic/Focus:  Meditation Therapy    Participation Level:  Did Not Attend    [name] 07/14/2024, 3:44 PM

## 2024-07-14 NOTE — BHH Counselor (Signed)
 CSW attempted to contact Nat Chang, [facility], [phone], to check on status of placement.  CSW left HIPAA compliant voicemail.    Sherryle Margo, MSW, LCSW 07/14/2024 1:33 PM

## 2024-07-14 NOTE — Group Note (Deleted)
 Date:  07/14/2024 Time:  8:47 PM  Group Topic/Focus:  Overcoming Stress:   The focus of this group is to define stress and help patients assess their triggers.     Participation Level:  {BHH PARTICIPATION OZCZO:77735}  Participation Quality:  {BHH PARTICIPATION QUALITY:22265}  Affect:  {BHH AFFECT:22266}  Cognitive:  {BHH COGNITIVE:22267}  Insight: {BHH Insight2:20797}  Engagement in Group:  {BHH ENGAGEMENT IN HMNLE:77731}  Modes of Intervention:  {BHH MODES OF INTERVENTION:22269}  Additional Comments:  PIERRETTE Ginny JONETTA Orma 07/14/2024, 8:47 PM

## 2024-07-14 NOTE — Progress Notes (Signed)
 Behavior:  Cooperative and pleasant.    Psych assessment:  Denies SI/HI.  Interaction / Group attendance:  Minimal time in milieu.  No interaction with peers.  Medication/ PRNs: Complaint.  Pain: Denies.  15 min checks in place for safety.

## 2024-07-15 NOTE — Progress Notes (Signed)
 Phone call back [facility] MD Progress Note  07/15/2024 5:56 PM [name]  MRN:  [id number]  [name] is a 63 y.o. female admitted: [facility] 05/02/2024 10:56 AM for dehydration, failure to thrive and hypoglycemia. She carries the psychiatric diagnoses of Schizophrenia and has medical hx of hypoglycemia and failure to thrive and has a past medical history of sinus bradycardia, chronic anemia, severe malnutrition and recurrent hypoglycemia.. Her current presentation of verbal aggression, agitation and refusal to eat with severe paranoia is most consistent with paranoid schizophrenia. [name]  She was at [facility] where she was admitted on 04/30/2024.  She was transferred to the ED because of failure to thrive and was admitted to medical floor.Patient is on IVC and patient has been adamantly refusing all oral medications and refusing to eat.  Patient is admitted to [facility] unit with Q15 min safety monitoring. Multidisciplinary team approach is offered. Medication management; group/milieu therapy is offered.    Subjective:  Chart reviewed, case discussed in multidisciplinary meeting, patient seen during rounds.    Patient stated she is hospitalized due to a violation injury but refused to provide further details. She repeatedly expressed a desire to return home and denied any medication side effects. Patient denied auditory or visual hallucinations, though she referenced hearing the word government. When asked about psychiatric symptoms, patient became verbally agitated with this clinical research associate and demanded that questioning stop. She voiced beliefs involving a government conspiracy and stated she wants her own clinical research associate. Patient was minimally cooperative and terminated portions of the interview.   Past Psychiatric History: see h&P Family History: History reviewed. No pertinent family history. Social History:  Social History   Substance and Sexual Activity  Alcohol Use Not Currently   Comment: Refuses to disclose  how much     Social History   Substance and Sexual Activity  Drug Use No   Comment: Refuses to answer    Social History   Socioeconomic History   Marital status: Single    Spouse name: Not on file   Number of children: Not on file   Years of education: Not on file   Highest education level: Not on file  Occupational History   Not on file  Tobacco Use   Smoking status: Every Day    Current packs/day: 2.00    Types: Cigarettes    Passive exposure: Current   Smokeless tobacco: Never  Vaping Use   Vaping status: Never Used  Substance and Sexual Activity   Alcohol use: Not Currently    Comment: Refuses to disclose how much   Drug use: No    Comment: Refuses to answer   Sexual activity: Not Currently    Comment: refused to answer  Other Topics Concern   Not on file  Social History Narrative   Not on file   Social Drivers of Health   Tobacco Use: High Risk (05/06/2024)   Patient History    Smoking Tobacco Use: Every Day    Smokeless Tobacco Use: Never    Passive Exposure: Current  Financial Resource Strain: Not on file  Food Insecurity: Patient Declined (05/06/2024)   Epic    Worried About Programme Researcher, Broadcasting/film/video in the Last Year: Patient declined    Barista in the Last Year: Patient declined  Transportation Needs: Patient Declined (05/06/2024)   Epic    Lack of Transportation (Medical): Patient declined    Lack of Transportation (Non-Medical): Patient declined  Physical Activity: Not on file  Stress: Not  on file  Social Connections: Not on file  Depression (EYV7-0): Not on file  Alcohol Screen: Low Risk (05/06/2024)   Alcohol Screen    Last Alcohol Screening Score (AUDIT): 0  Housing: Unknown (05/06/2024)   Epic    Unable to Pay for Housing in the Last Year: Patient declined    Number of Times Moved in the Last Year: Not on file    Homeless in the Last Year: Not on file  Utilities: Patient Declined (05/06/2024)   Epic    Threatened with loss of  utilities: Patient declined  Health Literacy: Not on file   Past Medical History:  Past Medical History:  Diagnosis Date   Anemia 10/02/2021   Non compliance w medication regimen    Schizophrenia [facility])     Past Surgical History:  Procedure Laterality Date   BIOPSY  09/25/2021   Procedure: BIOPSY;  Surgeon: Teressa Toribio SQUIBB, MD;  Location: [facility];  Service: Gastroenterology;;   ESOPHAGOGASTRODUODENOSCOPY (EGD) WITH PROPOFOL  [name] 09/25/2021   Procedure: ESOPHAGOGASTRODUODENOSCOPY (EGD) WITH PROPOFOL ;  Surgeon: Teressa Toribio SQUIBB, MD;  Location: [facility];  Service: Gastroenterology;  Laterality: [name];  With NGT placement    Current Medications: Current Facility-Administered Medications  Medication Dose Route Frequency Provider Last Rate Last Admin   acetaminophen  (TYLENOL ) tablet 650 mg  650 mg Oral Q6H PRN Starkes-Perry, Takia S, FNP       alum & mag hydroxide-simeth (MAALOX/MYLANTA) 200-200-20 MG/5ML suspension 30 mL  30 mL Oral Q4H PRN Starkes-Perry, Takia S, FNP       haloperidol  (HALDOL ) tablet 5 mg  5 mg Oral BID Jadapalle, Sree, MD   5 mg at 07/15/24 9052   Or   haloperidol  lactate (HALDOL ) injection 10 mg  10 mg Intramuscular BID Jadapalle, Sree, MD       HYDROcodone -acetaminophen  (NORCO/VICODIN) 5-325 MG per tablet 1-2 tablet  1-2 tablet Oral Q4H PRN Starkes-Perry, Takia S, FNP       magnesium  hydroxide (MILK OF MAGNESIA) suspension 30 mL  30 mL Oral Daily PRN Starkes-Perry, Takia S, FNP       multivitamin with minerals tablet 1 tablet  1 tablet Oral Daily Wilkie Majel RAMAN, FNP   1 tablet at 07/15/24 9053   OLANZapine  (ZYPREXA ) injection 5 mg  5 mg Intramuscular TID PRN Wilkie Majel RAMAN, FNP       OLANZapine  (ZYPREXA ) tablet 15 mg  15 mg Oral BID Jadapalle, Sree, MD   15 mg at 07/15/24 9052   OLANZapine  zydis (ZYPREXA ) disintegrating tablet 5 mg  5 mg Oral TID PRN Starkes-Perry, Takia S, FNP       ondansetron  (ZOFRAN ) tablet 4 mg  4 mg Oral Q6H PRN Wilkie Majel RAMAN, FNP       Or   ondansetron  (ZOFRAN ) injection 4 mg  4 mg Intravenous Q6H PRN Starkes-Perry, Majel RAMAN, FNP       thiamine  (VITAMIN B1) tablet 100 mg  100 mg Oral Daily Wilkie Majel RAMAN, FNP   100 mg at 07/15/24 9052   traZODone  (DESYREL ) tablet 50 mg  50 mg Oral QHS Starkes-Perry, Takia S, FNP   50 mg at 07/14/24 2134    Lab Results:  No results found for this or any previous visit (from the past 48 hours).   Blood Alcohol level:  Lab Results  Component Value Date   [facility] <15 05/02/2024   ETH <15 05/01/2024    Metabolic Disorder Labs: Lab Results  Component Value Date   HGBA1C 5.6 05/14/2024  MPG 114.02 05/14/2024   MPG 116.89 05/02/2024   Lab Results  Component Value Date   PROLACTIN 13.6 04/16/2015   Lab Results  Component Value Date   CHOL 219 (H) 05/14/2024   TRIG 84 05/14/2024   HDL 83 05/14/2024   CHOLHDL 2.6 05/14/2024   VLDL 17 05/14/2024   LDLCALC 119 (H) 05/14/2024   LDLCALC 130 (H) 05/01/2024    Physical Findings: AIMS:  , ,  ,  ,    CIWA:    COWS:      Psychiatric Specialty Exam:  Presentation  General Appearance:  Casual  Eye Contact: Fair  Speech: Normal rate  Speech Volume: Increased    Mood and Affect  Mood: Angry  Affect: Congruent   Thought Process  Thought Processes: Disorganized  Descriptions of Associations:Tangential  Orientation:Partial  Thought Content:Illogical; Tangential  Hallucinations:RESPONDING TO INTERNAL STIMULI Ideas of Reference:Paranoia; Delusions; Percusatory  Suicidal Thoughts:not endorsing Homicidal Thoughts:nor endorsing  Sensorium  Memory: Recent Poor  Judgment: Poor  Insight: Poor   Executive Functions  Concentration: Poor  Attention Span: Poor  Recall: Poor  Fund of Knowledge: Poor  Language: Poor   Psychomotor Activity  Psychomotor Activity:No data recorded Musculoskeletal: Strength & Muscle Tone: within normal limits Gait & Station:  normal Assets  Assets: Resilience    Physical Exam: Physical Exam Vitals and nursing note reviewed.    ROS Blood pressure 96/60, pulse (!) 58, temperature (!) 97.5 F (36.4 C), resp. rate 18, height 5' 4 (1.626 m), weight 44.9 kg, SpO2 100%. Body mass index is 16.99 kg/m.  Diagnosis: Active Problems:   Schizophrenia, paranoid (HCC) Paranoid  PLAN: Safety and Monitoring:  -- Involuntary admission to inpatient psychiatric unit for safety, stabilization and treatment  -- Daily contact with patient to assess and evaluate symptoms and progress in treatment  -- Patient's case to be discussed in multi-disciplinary team meeting  -- Observation Level : q15 minute checks  -- Vital signs:  q12 hours  -- Precautions: suicide, elopement, and assault -- Encouraged patient to participate in unit milieu and in scheduled group therapies  2. Psychiatric Diagnoses and Treatment: Patient  2 physicians approved non emergent forced medications legal guardian gave consent for Haldol  oral or IM  Zyprexa  15 mg BID  Haldol  10 mg bid added - Plan for LAI haldol  Dec 100mg  Q4 weeks DSS gave consent-nonemergent forced medication consented by legal guardian and patient will receive Haldol  5 mg twice daily IM if she refuses oral . Discharge Planning:   -- Social work and case management to assist with discharge planning and identification of hospital follow-up needs prior to discharge  -- Estimated LOS: 3-4 days  Desmond Chimera, MD 07/15/2024, 5:56 PM

## 2024-07-15 NOTE — Group Note (Signed)
 LCSW Group Therapy Note   Group Date: 07/15/2024 Start Time: 1400 End Time: 1430   Type of Therapy and Topic:  Group Therapy: Managing Intrusive Thoughts  Participation Level:  Did Not Attend  Description of Group: Patient did not attend  Therapeutic Goals:  1.     Summary of Patient Progress:    Patient did not attend  group  Therapeutic Modalities:   [name] 07/15/2024  2:44 PM

## 2024-07-15 NOTE — Plan of Care (Signed)
  Problem: Education: Goal: Knowledge of [place] [facility] Education information/materials will improve Outcome: Progressing Goal: Emotional status will improve Outcome: Progressing Goal: Mental status will improve Outcome: Progressing Goal: Verbalization of understanding the information provided will improve Outcome: Progressing   Problem: Activity: Goal: Interest or engagement in activities will improve Outcome: Progressing Goal: Sleeping patterns will improve Outcome: Progressing   Problem: Coping: Goal: Ability to verbalize frustrations and anger appropriately will improve Outcome: Progressing Goal: Ability to demonstrate self-control will improve Outcome: Progressing   Problem: Health Behavior/Discharge Planning: Goal: Identification of resources available to assist in meeting health care needs will improve Outcome: Progressing Goal: Compliance with treatment plan for underlying cause of condition will improve Outcome: Progressing   Problem: Physical Regulation: Goal: Ability to maintain clinical measurements within normal limits will improve Outcome: Progressing   Problem: Safety: Goal: Periods of time without injury will increase Outcome: Progressing   Problem: Education: Goal: Knowledge of General Education information will improve Description: Including pain rating scale, medication(s)/side effects and non-pharmacologic comfort measures Outcome: Progressing   Problem: Health Behavior/Discharge Planning: Goal: Ability to manage health-related needs will improve Outcome: Progressing   Problem: Clinical Measurements: Goal: Ability to maintain clinical measurements within normal limits will improve Outcome: Progressing Goal: Will remain free from infection Outcome: Progressing Goal: Diagnostic test results will improve Outcome: Progressing Goal: Respiratory complications will improve Outcome: Progressing Goal: Cardiovascular complication will be  avoided Outcome: Progressing   Problem: Activity: Goal: Risk for activity intolerance will decrease Outcome: Progressing   Problem: Nutrition: Goal: Adequate nutrition will be maintained Outcome: Progressing   Problem: Coping: Goal: Level of anxiety will decrease Outcome: Progressing   Problem: Elimination: Goal: Will not experience complications related to bowel motility Outcome: Progressing Goal: Will not experience complications related to urinary retention Outcome: Progressing   Problem: Pain Managment: Goal: General experience of comfort will improve and/or be controlled Outcome: Progressing   Problem: Safety: Goal: Ability to remain free from injury will improve Outcome: Progressing   Problem: Skin Integrity: Goal: Risk for impaired skin integrity will decrease Outcome: Progressing   Problem: Health Behavior/Discharge Planning: Goal: Compliance with prescribed medication regimen will improve Outcome: Progressing   Problem: Nutritional: Goal: Ability to achieve adequate nutritional intake will improve Outcome: Progressing

## 2024-07-15 NOTE — Group Note (Signed)
 Date:  07/15/2024 Time:  10:49 AM  Group Topic/Focus:  Wellness Toolbox:   The focus of this group is to discuss various aspects of wellness, balancing those aspects and exploring ways to increase the ability to experience wellness.  Patients will create a wellness toolbox for use upon discharge.  I had the opportunity to go over the holiday and New Year activity sheets with each patient individually. The activity sheets provided patients with an opportunity to express personal memories, reflect on meaningful life experiences, and share favorite traditions, movies, and meals. Patients were also encouraged to discuss gentle New Year plans and goals, focusing on realistic and positive intentions for the upcoming year.  The activities supported cognitive engagement through word searches, memory recall, simple problem-solving, and reflection exercises. Patients were able to participate at their own pace, and assistance was provided as needed. The activity sheets also included a brief, gentle movement component, allowing patients to engage in light physical activity appropriate to their abilities.  Overall, the activity promoted emotional expression, social interaction, and mental stimulation in a calm and supportive manner. Patients appeared engaged and receptive, and the activity provided a meaningful opportunity for conversation, self-expression, and encouragement of overall well-being.  Participation Level:  Did Not Attend  Participation Quality:    Affect:    Cognitive:    Insight:   Engagement in Group:    Modes of Intervention:    Additional Comments:    Rayven Hendrickson L Vaida Kerchner 07/15/2024, 10:49 AM

## 2024-07-15 NOTE — Progress Notes (Signed)
 Patient accepted medications without issue. No PRNs this shift. Last BM today.    07/15/24 1100  Psych Admission Type (Psych Patients Only)  Admission Status Involuntary  Psychosocial Assessment  Patient Complaints None  Eye Contact Avoids  Facial Expression Flat  Affect Flat  Speech Soft  Interaction Avoidant  Motor Activity Slow  Appearance/Hygiene In scrubs  Behavior Characteristics Cooperative  Mood Pleasant  Thought Process  Coherency WDL  Content WDL  Delusions None reported or observed  Perception WDL  Hallucination None reported or observed  Judgment Poor  Confusion Mild  Danger to Self  Current suicidal ideation? Denies  Danger to Others  Danger to Others None reported or observed

## 2024-07-15 NOTE — Progress Notes (Signed)
" °   07/15/24 0224  Psych Admission Type (Psych Patients Only)  Admission Status Involuntary  Psychosocial Assessment  Patient Complaints None  Eye Contact Avoids  Facial Expression Flat  Affect Flat  Speech Soft  Interaction Minimal;No initiation  Motor Activity Restless  Appearance/Hygiene In scrubs;Unremarkable  Behavior Characteristics Cooperative  Mood Pleasant  Aggressive Behavior  Effect No apparent injury  Thought Process  Coherency WDL  Content WDL  Delusions None reported or observed  Perception WDL  Hallucination None reported or observed  Judgment Poor  Confusion Mild  Danger to Self  Current suicidal ideation? Denies  Agreement Not to Harm Self Yes  Description of Agreement Verbal  Danger to Others  Danger to Others None reported or observed    "

## 2024-07-15 NOTE — Plan of Care (Signed)
" °  Problem: Coping: Goal: Ability to verbalize frustrations and anger appropriately will improve Outcome: Progressing Goal: Ability to demonstrate self-control will improve Outcome: Progressing   Problem: Education: Goal: Knowledge of [facility] Education information/materials will improve Outcome: Not Progressing Goal: Emotional status will improve Outcome: Not Progressing Goal: Mental status will improve Outcome: Not Progressing Goal: Verbalization of understanding the information provided will improve Outcome: Not Progressing   Problem: Health Behavior/Discharge Planning: Goal: Identification of resources available to assist in meeting health care needs will improve Outcome: Not Progressing Goal: Compliance with treatment plan for underlying cause of condition will improve Outcome: Not Progressing   Problem: Safety: Goal: Periods of time without injury will increase Outcome: Not Progressing   Problem: Activity: Goal: Risk for activity intolerance will decrease Outcome: Not Progressing   "

## 2024-07-15 NOTE — Group Note (Signed)
 Date:  07/15/2024 Time:  4:09 PM  Group Topic/Focus:  Fresh air Therapy with Music and Conversation    Participation Level:  Did Not Attend    [name] Bias 07/15/2024, 4:09 PM

## 2024-07-16 NOTE — Progress Notes (Signed)
 Phone call back [facility] MD Progress Note  07/16/2024 5:14 PM Britten Parady  MRN:  [id number]  Aashi Derrington is a 63 y.o. female admitted: [facility] 05/02/2024 10:56 AM for dehydration, failure to thrive and hypoglycemia. She carries the psychiatric diagnoses of Schizophrenia and has medical hx of hypoglycemia and failure to thrive and has a past medical history of sinus bradycardia, chronic anemia, severe malnutrition and recurrent hypoglycemia.. Her current presentation of verbal aggression, agitation and refusal to eat with severe paranoia is most consistent with paranoid schizophrenia. [name]  She was at [facility] where she was admitted on 04/30/2024.  She was transferred to the ED because of failure to thrive and was admitted to medical floor.Patient is on IVC and patient has been adamantly refusing all oral medications and refusing to eat.  Patient is admitted to [facility] unit with Q15 min safety monitoring. Multidisciplinary team approach is offered. Medication management; group/milieu therapy is offered.    Subjective:  Chart reviewed, case discussed in multidisciplinary meeting, patient seen during rounds.    Per staff report patient slept for 10.25 hours.  Still continues to respond to internal stimuli and talk to herself.  Mostly stays in her room.  Was seen in her room, mumbling to herself and walking around.  Patient was irritated and this clinical research associate introduced himself.  She started talking about government and legal figures such as a production manager.  She told this clinical research associate that this clinical research associate can understand what she is talking about.  Then asked this writer to get out of her room.  She had strong desire to leave the facility, stating  I'm ready to go home.  He remained paranoid, distrustful about this clinical research associate.     Past Psychiatric History: see h&P Family History: History reviewed. No pertinent family history. Social History:  Social History   Substance and Sexual Activity  Alcohol Use  Not Currently   Comment: Refuses to disclose how much     Social History   Substance and Sexual Activity  Drug Use No   Comment: Refuses to answer    Social History   Socioeconomic History   Marital status: Single    Spouse name: Not on file   Number of children: Not on file   Years of education: Not on file   Highest education level: Not on file  Occupational History   Not on file  Tobacco Use   Smoking status: Every Day    Current packs/day: 2.00    Types: Cigarettes    Passive exposure: Current   Smokeless tobacco: Never  Vaping Use   Vaping status: Never Used  Substance and Sexual Activity   Alcohol use: Not Currently    Comment: Refuses to disclose how much   Drug use: No    Comment: Refuses to answer   Sexual activity: Not Currently    Comment: refused to answer  Other Topics Concern   Not on file  Social History Narrative   Not on file   Social Drivers of Health   Tobacco Use: High Risk (05/06/2024)   Patient History    Smoking Tobacco Use: Every Day    Smokeless Tobacco Use: Never    Passive Exposure: Current  Financial Resource Strain: Not on file  Food Insecurity: Patient Declined (05/06/2024)   Epic    Worried About Programme Researcher, Broadcasting/film/video in the Last Year: Patient declined    Barista in the Last Year: Patient declined  Transportation Needs: Patient Declined (  05/06/2024)   Epic    Lack of Transportation (Medical): Patient declined    Lack of Transportation (Non-Medical): Patient declined  Physical Activity: Not on file  Stress: Not on file  Social Connections: Not on file  Depression (EYV7-0): Not on file  Alcohol Screen: Low Risk (05/06/2024)   Alcohol Screen    Last Alcohol Screening Score (AUDIT): 0  Housing: Unknown (05/06/2024)   Epic    Unable to Pay for Housing in the Last Year: Patient declined    Number of Times Moved in the Last Year: Not on file    Homeless in the Last Year: Not on file  Utilities: Patient Declined  (05/06/2024)   Epic    Threatened with loss of utilities: Patient declined  Health Literacy: Not on file   Past Medical History:  Past Medical History:  Diagnosis Date   Anemia 10/02/2021   Non compliance w medication regimen    Schizophrenia (HCC)     Past Surgical History:  Procedure Laterality Date   BIOPSY  09/25/2021   Procedure: BIOPSY;  Surgeon: Teressa Toribio SQUIBB, MD;  Location: [facility];  Service: Gastroenterology;;   ESOPHAGOGASTRODUODENOSCOPY (EGD) WITH PROPOFOL  [name] 09/25/2021   Procedure: ESOPHAGOGASTRODUODENOSCOPY (EGD) WITH PROPOFOL ;  Surgeon: Teressa Toribio SQUIBB, MD;  Location: [facility];  Service: Gastroenterology;  Laterality: [name];  With NGT placement    Current Medications: Current Facility-Administered Medications  Medication Dose Route Frequency Provider Last Rate Last Admin   acetaminophen  (TYLENOL ) tablet 650 mg  650 mg Oral Q6H PRN Starkes-Perry, Takia S, FNP       alum & mag hydroxide-simeth (MAALOX/MYLANTA) 200-200-20 MG/5ML suspension 30 mL  30 mL Oral Q4H PRN Starkes-Perry, Takia S, FNP       haloperidol  (HALDOL ) tablet 5 mg  5 mg Oral BID Jadapalle, Sree, MD   5 mg at 07/16/24 9068   Or   haloperidol  lactate (HALDOL ) injection 10 mg  10 mg Intramuscular BID Jadapalle, Sree, MD       HYDROcodone -acetaminophen  (NORCO/VICODIN) 5-325 MG per tablet 1-2 tablet  1-2 tablet Oral Q4H PRN Starkes-Perry, Takia S, FNP       magnesium  hydroxide (MILK OF MAGNESIA) suspension 30 mL  30 mL Oral Daily PRN Starkes-Perry, Takia S, FNP       multivitamin with minerals tablet 1 tablet  1 tablet Oral Daily Wilkie Majel RAMAN, FNP   1 tablet at 07/16/24 0931   OLANZapine  (ZYPREXA ) injection 5 mg  5 mg Intramuscular TID PRN Wilkie Majel RAMAN, FNP       OLANZapine  (ZYPREXA ) tablet 15 mg  15 mg Oral BID Jadapalle, Sree, MD   15 mg at 07/16/24 9068   OLANZapine  zydis (ZYPREXA ) disintegrating tablet 5 mg  5 mg Oral TID PRN Wilkie Majel RAMAN, FNP       ondansetron  (ZOFRAN )  tablet 4 mg  4 mg Oral Q6H PRN Wilkie Majel RAMAN, FNP       Or   ondansetron  (ZOFRAN ) injection 4 mg  4 mg Intravenous Q6H PRN Starkes-Perry, Majel RAMAN, FNP       thiamine  (VITAMIN B1) tablet 100 mg  100 mg Oral Daily Starkes-Perry, Takia S, FNP   100 mg at 07/16/24 0931   traZODone  (DESYREL ) tablet 50 mg  50 mg Oral QHS Starkes-Perry, Takia S, FNP   50 mg at 07/15/24 2133    Lab Results:  No results found for this or any previous visit (from the past 48 hours).   Blood Alcohol level:  Lab Results  Component Value Date   [facility] <15 05/02/2024   ETH <15 05/01/2024    Metabolic Disorder Labs: Lab Results  Component Value Date   HGBA1C 5.6 05/14/2024   MPG 114.02 05/14/2024   MPG 116.89 05/02/2024   Lab Results  Component Value Date   PROLACTIN 13.6 04/16/2015   Lab Results  Component Value Date   CHOL 219 (H) 05/14/2024   TRIG 84 05/14/2024   HDL 83 05/14/2024   CHOLHDL 2.6 05/14/2024   VLDL 17 05/14/2024   LDLCALC 119 (H) 05/14/2024   LDLCALC 130 (H) 05/01/2024    Physical Findings: AIMS:  , ,  ,  ,    CIWA:    COWS:      Psychiatric Specialty Exam:  Presentation  General Appearance:  Casual  Eye Contact: Fair  Speech: Normal rate  Speech Volume: Increased    Mood and Affect  Mood: Angry  Affect: Congruent   Thought Process  Thought Processes: Disorganized  Descriptions of Associations:Tangential  Orientation:Partial  Thought Content:Illogical; Tangential  Hallucinations:RESPONDING TO INTERNAL STIMULI Ideas of Reference:Paranoia; Delusions; Percusatory  Suicidal Thoughts:not endorsing Homicidal Thoughts:nor endorsing  Sensorium  Memory: Recent Poor  Judgment: Poor  Insight: Poor   Executive Functions  Concentration: Poor  Attention Span: Poor  Recall: Poor  Fund of Knowledge: Poor  Language: Poor   Psychomotor Activity  Psychomotor Activity:No data recorded Musculoskeletal: Strength & Muscle Tone:  within normal limits Gait & Station: normal Assets  Assets: Resilience    Physical Exam: Physical Exam Vitals and nursing note reviewed.    ROS Blood pressure 107/72, pulse (!) 56, temperature 98 F (36.7 C), resp. rate 20, height 5' 4 (1.626 m), weight 44.9 kg, SpO2 100%. Body mass index is 16.99 kg/m.  Diagnosis: Active Problems:   Schizophrenia, paranoid (HCC) Paranoid  PLAN: Safety and Monitoring:  -- Involuntary admission to inpatient psychiatric unit for safety, stabilization and treatment  -- Daily contact with patient to assess and evaluate symptoms and progress in treatment  -- Patient's case to be discussed in multi-disciplinary team meeting  -- Observation Level : q15 minute checks  -- Vital signs:  q12 hours  -- Precautions: suicide, elopement, and assault -- Encouraged patient to participate in unit milieu and in scheduled group therapies  2. Psychiatric Diagnoses and Treatment: Patient  2 physicians approved non emergent forced medications legal guardian gave consent for Haldol  oral or IM  Zyprexa  15 mg BID  Haldol  10 mg bid added - Plan for [facility]I haldol  Dec 100mg  Q4 weeks DSS gave consent-nonemergent forced medication consented by legal guardian  . Discharge Planning:   -- Social work and case management to assist with discharge planning and identification of hospital follow-up needs prior to discharge  -- Estimated LOS: 3-4 days  Desmond Chimera, MD 07/16/2024, 5:14 PM

## 2024-07-16 NOTE — Plan of Care (Signed)
  Problem: Education: Goal: Knowledge of [place] [facility] Education information/materials will improve Outcome: Progressing Goal: Emotional status will improve Outcome: Progressing Goal: Mental status will improve Outcome: Progressing Goal: Verbalization of understanding the information provided will improve Outcome: Progressing   Problem: Activity: Goal: Interest or engagement in activities will improve Outcome: Progressing Goal: Sleeping patterns will improve Outcome: Progressing   Problem: Coping: Goal: Ability to verbalize frustrations and anger appropriately will improve Outcome: Progressing Goal: Ability to demonstrate self-control will improve Outcome: Progressing   Problem: Health Behavior/Discharge Planning: Goal: Identification of resources available to assist in meeting health care needs will improve Outcome: Progressing Goal: Compliance with treatment plan for underlying cause of condition will improve Outcome: Progressing   Problem: Physical Regulation: Goal: Ability to maintain clinical measurements within normal limits will improve Outcome: Progressing   Problem: Safety: Goal: Periods of time without injury will increase Outcome: Progressing   Problem: Education: Goal: Knowledge of General Education information will improve Description: Including pain rating scale, medication(s)/side effects and non-pharmacologic comfort measures Outcome: Progressing   Problem: Health Behavior/Discharge Planning: Goal: Ability to manage health-related needs will improve Outcome: Progressing   Problem: Clinical Measurements: Goal: Ability to maintain clinical measurements within normal limits will improve Outcome: Progressing Goal: Will remain free from infection Outcome: Progressing Goal: Diagnostic test results will improve Outcome: Progressing Goal: Respiratory complications will improve Outcome: Progressing Goal: Cardiovascular complication will be  avoided Outcome: Progressing   Problem: Activity: Goal: Risk for activity intolerance will decrease Outcome: Progressing   Problem: Nutrition: Goal: Adequate nutrition will be maintained Outcome: Progressing   Problem: Coping: Goal: Level of anxiety will decrease Outcome: Progressing   Problem: Elimination: Goal: Will not experience complications related to bowel motility Outcome: Progressing Goal: Will not experience complications related to urinary retention Outcome: Progressing   Problem: Pain Managment: Goal: General experience of comfort will improve and/or be controlled Outcome: Progressing   Problem: Safety: Goal: Ability to remain free from injury will improve Outcome: Progressing   Problem: Skin Integrity: Goal: Risk for impaired skin integrity will decrease Outcome: Progressing   Problem: Health Behavior/Discharge Planning: Goal: Compliance with prescribed medication regimen will improve Outcome: Progressing   Problem: Nutritional: Goal: Ability to achieve adequate nutritional intake will improve Outcome: Progressing

## 2024-07-16 NOTE — Progress Notes (Signed)
 Patient pleasant but short during interaction. Accepted medications without issue. Reports LBM 07/16/2024. Remains isolated to self. Present in dayroom for meals.  07/16/24 1200  Psych Admission Type (Psych Patients Only)  Admission Status Involuntary  Psychosocial Assessment  Patient Complaints Depression  Eye Contact Avoids  Facial Expression Flat  Affect Flat  Speech Soft  Interaction Avoidant  Motor Activity Slow  Appearance/Hygiene In scrubs  Behavior Characteristics Cooperative  Mood Pleasant  Thought Process  Coherency WDL  Content WDL  Delusions None reported or observed  Perception WDL  Hallucination None reported or observed  Judgment WDL  Confusion WDL  Danger to Self  Current suicidal ideation? Denies  Danger to Others  Danger to Others None reported or observed

## 2024-07-16 NOTE — Group Note (Signed)
 Date:  07/16/2024 Time:  3:51 PM  Group Topic/Focus:  Karaoke Group: The purpose of this group is for patients to interact with each other while singing their favorite songs.     Participation Level:  Did Not Attend  [name] 07/16/2024, 3:51 PM

## 2024-07-16 NOTE — Progress Notes (Signed)
" °   07/16/24 0458  Psych Admission Type (Psych Patients Only)  Admission Status Involuntary  Psychosocial Assessment  Patient Complaints Depression  Eye Contact Avoids  Facial Expression Flat  Affect Flat  Speech Soft  Interaction Avoidant  Motor Activity Slow  Appearance/Hygiene In scrubs  Behavior Characteristics Cooperative  Mood Pleasant  Aggressive Behavior  Effect No apparent injury  Thought Process  Coherency WDL  Content WDL  Delusions WDL  Perception WDL  Hallucination None reported or observed  Judgment WDL  Confusion WDL  Danger to Self  Current suicidal ideation? Denies  Agreement Not to Harm Self Yes  Description of Agreement Verbal  Danger to Others  Danger to Others None reported or observed    "

## 2024-07-16 NOTE — Group Note (Signed)
 Date:  07/16/2024 Time:  9:27 PM  Group Topic/Focus:  Wrap-Up Group:   The focus of this group is to help patients review their daily goal of treatment and discuss progress on daily workbooks.    Participation Level:  Did Not Attend  Participation Quality:     Affect:     Cognitive:     Insight: None  Engagement in Group:  None  Modes of Intervention:     Additional Comments:    [name] Bunker 07/16/2024, 9:27 PM

## 2024-07-16 NOTE — Plan of Care (Signed)
  Problem: Education: Goal: Emotional status will improve Outcome: Not Progressing Goal: Mental status will improve Outcome: Not Progressing   Problem: Activity: Goal: Interest or engagement in activities will improve Outcome: Not Progressing   Problem: Coping: Goal: Ability to verbalize frustrations and anger appropriately will improve Outcome: Not Progressing Goal: Ability to demonstrate self-control will improve Outcome: Not Progressing

## 2024-07-16 NOTE — Group Note (Signed)
 Date:  07/16/2024 Time:  10:54 AM  Group Topic/Focus:  Coping With Mental Health Crisis:   The purpose of this group is to help patients identify strategies for coping with mental health crisis.  Group discusses possible causes of crisis and ways to manage them effectively.    Participation Level:  Did Not Attend    [name] Bias 07/16/2024, 10:54 AM

## 2024-07-17 NOTE — Plan of Care (Signed)
  Problem: Education: Goal: Knowledge of [place] [facility] Education information/materials will improve Outcome: Progressing Goal: Emotional status will improve Outcome: Progressing Goal: Mental status will improve Outcome: Progressing Goal: Verbalization of understanding the information provided will improve Outcome: Progressing   Problem: Activity: Goal: Interest or engagement in activities will improve Outcome: Progressing Goal: Sleeping patterns will improve Outcome: Progressing   Problem: Coping: Goal: Ability to verbalize frustrations and anger appropriately will improve Outcome: Progressing Goal: Ability to demonstrate self-control will improve Outcome: Progressing   Problem: Health Behavior/Discharge Planning: Goal: Identification of resources available to assist in meeting health care needs will improve Outcome: Progressing Goal: Compliance with treatment plan for underlying cause of condition will improve Outcome: Progressing   Problem: Physical Regulation: Goal: Ability to maintain clinical measurements within normal limits will improve Outcome: Progressing   Problem: Safety: Goal: Periods of time without injury will increase Outcome: Progressing   Problem: Education: Goal: Knowledge of General Education information will improve Description: Including pain rating scale, medication(s)/side effects and non-pharmacologic comfort measures Outcome: Progressing   Problem: Health Behavior/Discharge Planning: Goal: Ability to manage health-related needs will improve Outcome: Progressing   Problem: Clinical Measurements: Goal: Ability to maintain clinical measurements within normal limits will improve Outcome: Progressing Goal: Will remain free from infection Outcome: Progressing Goal: Diagnostic test results will improve Outcome: Progressing Goal: Respiratory complications will improve Outcome: Progressing Goal: Cardiovascular complication will be  avoided Outcome: Progressing   Problem: Activity: Goal: Risk for activity intolerance will decrease Outcome: Progressing   Problem: Nutrition: Goal: Adequate nutrition will be maintained Outcome: Progressing   Problem: Coping: Goal: Level of anxiety will decrease Outcome: Progressing   Problem: Elimination: Goal: Will not experience complications related to bowel motility Outcome: Progressing Goal: Will not experience complications related to urinary retention Outcome: Progressing   Problem: Pain Managment: Goal: General experience of comfort will improve and/or be controlled Outcome: Progressing   Problem: Safety: Goal: Ability to remain free from injury will improve Outcome: Progressing   Problem: Skin Integrity: Goal: Risk for impaired skin integrity will decrease Outcome: Progressing   Problem: Health Behavior/Discharge Planning: Goal: Compliance with prescribed medication regimen will improve Outcome: Progressing   Problem: Nutritional: Goal: Ability to achieve adequate nutritional intake will improve Outcome: Progressing

## 2024-07-17 NOTE — Progress Notes (Signed)
" °   07/17/24 0612  Psych Admission Type (Psych Patients Only)  Admission Status Involuntary  Psychosocial Assessment  Patient Complaints Depression  Eye Contact Avoids  Facial Expression Flat  Affect Flat  Speech Soft  Interaction Avoidant  Motor Activity Slow  Appearance/Hygiene In scrubs  Behavior Characteristics Cooperative  Mood Pleasant  Aggressive Behavior  Effect No apparent injury  Thought Process  Coherency WDL  Content WDL  Delusions None reported or observed  Perception WDL  Hallucination None reported or observed  Judgment WDL  Confusion WDL  Danger to Self  Current suicidal ideation? Denies  Agreement Not to Harm Self Yes  Description of Agreement Verbal  Danger to Others  Danger to Others None reported or observed    "

## 2024-07-17 NOTE — Group Note (Signed)
 Physical/Occupational Therapy Group Note  Group Topic: Yoga  Group Date: 07/17/2024 Start Time: 1305 End Time: 1333 Facilitators: Arshiya Jakes, Alm Hamilton, PT   Group Description: Group participated with series of yoga poses, designed to emphasize functional sitting balance, core stability, generalized flexibility and overall posture.  Incorporated deep breathing techniques with poses, working to promote relaxation, mindfulness and focus with targeted activities.  Discussed benefits of yoga in improving mood and self-esteem, reducing stress and anxiety, and promoting functional strength, balance and core stability for each participant.  Discussed ways to integrate into each participants daily routine.  Provided handout with written and pictorial descriptions of included yoga movements to be utilized as appropriate outside of group time.  Therapeutic Goal(s):  Demonstrate safe ability to participate with yoga poses during group activity. Identify one benefit of participation with yoga poses as part of each participants exercise/movement routine. Identify 1-2 individual poses that participant feels most beneficial to his/her needs and that he/she can easily replicate outside of group.  Individual Participation: Did not attend  Participation Level:   Participation Quality:   Behavior:   Speech/Thought Process:   Affect/Mood:   Insight:   Judgement:   Modes of Intervention:   Patient Response to Interventions:    Plan: Continue to engage patient in PT/OT groups 1 - 2x/week.  CHARM Hamilton Bertin PT, DPT 07/17/2024, 1:37 PM

## 2024-07-17 NOTE — Group Note (Signed)
 Date:  07/17/2024 Time:  10:38 AM  Group Topic/Focus:    For geriatric mental health patients, effective exercises combine gentle movement, mind-body practices, and social engagement, focusing on activities like walking in nature, yoga, Tai Chi, water  aerobics, and light strength training, which boost mood, reduce anxiety, improve sleep, and fight cognitive decline by increasing blood flow and endorphins, while also providing structure and purpose. Small amounts of daily activity (even 10 minutes) help, with a mix of aerobic, balance, and strength exercises recommended for overall well-being.    Participation Level:  Did Not Attend   [name] 07/17/2024, 10:38 AM

## 2024-07-17 NOTE — Progress Notes (Signed)
 Phone call back [facility] MD Progress Note  07/17/2024 1:00 PM [name]  MRN:  [id number]  [name] is a 63 y.o. female admitted: Medicallyfor 05/02/2024 10:56 AM for dehydration, failure to thrive and hypoglycemia. She carries the psychiatric diagnoses of Schizophrenia and has medical hx of hypoglycemia and failure to thrive and has a past medical history of sinus bradycardia, chronic anemia, severe malnutrition and recurrent hypoglycemia.. Her current presentation of verbal aggression, agitation and refusal to eat with severe paranoia is most consistent with paranoid schizophrenia. [name]  She was at [facility] where she was admitted on 04/30/2024.  She was transferred to the ED because of failure to thrive and was admitted to medical floor.Patient is on IVC and patient has been adamantly refusing all oral medications and refusing to eat.  Patient is admitted to [facility] unit with Q15 min safety monitoring. Multidisciplinary team approach is offered. Medication management; group/milieu therapy is offered.    Subjective:  Chart reviewed, case discussed in multidisciplinary meeting, patient seen during rounds.    Per staff report patient is sleeping okay.  Mostly stays in her room.  When out minimal engagement.  Patient continues to be irritated during the conversation today.  Patient still noted to be responding to internal stimuli in her room pacing.  Patient continues to talk about some government things and some violation .  Patient continues to be paranoid and refused to engage.  Talk to the staff member who have cared for the patient in the past and it seems like this is the patient baseline.  Worker still working on finding a placement for her.    Past Psychiatric History: see h&P Family History: History reviewed. No pertinent family history. Social History:  Social History   Substance and Sexual Activity  Alcohol Use Not Currently   Comment: Refuses to disclose how much     Social History    Substance and Sexual Activity  Drug Use No   Comment: Refuses to answer    Social History   Socioeconomic History   Marital status: Single    Spouse name: Not on file   Number of children: Not on file   Years of education: Not on file   Highest education level: Not on file  Occupational History   Not on file  Tobacco Use   Smoking status: Every Day    Current packs/day: 2.00    Types: Cigarettes    Passive exposure: Current   Smokeless tobacco: Never  Vaping Use   Vaping status: Never Used  Substance and Sexual Activity   Alcohol use: Not Currently    Comment: Refuses to disclose how much   Drug use: No    Comment: Refuses to answer   Sexual activity: Not Currently    Comment: refused to answer  Other Topics Concern   Not on file  Social History Narrative   Not on file   Social Drivers of Health   Tobacco Use: High Risk (05/06/2024)   Patient History    Smoking Tobacco Use: Every Day    Smokeless Tobacco Use: Never    Passive Exposure: Current  Financial Resource Strain: Not on file  Food Insecurity: Patient Declined (05/06/2024)   Epic    Worried About Programme Researcher, Broadcasting/film/video in the Last Year: Patient declined    Barista in the Last Year: Patient declined  Transportation Needs: Patient Declined (05/06/2024)   Epic    Lack of Transportation (Medical): Patient declined  Lack of Transportation (Non-Medical): Patient declined  Physical Activity: Not on file  Stress: Not on file  Social Connections: Not on file  Depression (EYV7-0): Not on file  Alcohol Screen: Low Risk (05/06/2024)   Alcohol Screen    Last Alcohol Screening Score (AUDIT): 0  Housing: Unknown (05/06/2024)   Epic    Unable to Pay for Housing in the Last Year: Patient declined    Number of Times Moved in the Last Year: Not on file    Homeless in the Last Year: Not on file  Utilities: Patient Declined (05/06/2024)   Epic    Threatened with loss of utilities: Patient declined  Health  Literacy: Not on file   Past Medical History:  Past Medical History:  Diagnosis Date   Anemia 10/02/2021   Non compliance w medication regimen    Schizophrenia (HCC)     Past Surgical History:  Procedure Laterality Date   BIOPSY  09/25/2021   Procedure: BIOPSY;  Surgeon: Teressa Toribio SQUIBB, MD;  Location: [facility];  Service: Gastroenterology;;   ESOPHAGOGASTRODUODENOSCOPY (EGD) WITH PROPOFOL  [name] 09/25/2021   Procedure: ESOPHAGOGASTRODUODENOSCOPY (EGD) WITH PROPOFOL ;  Surgeon: Teressa Toribio SQUIBB, MD;  Location: [facility];  Service: Gastroenterology;  Laterality: [name];  With NGT placement    Current Medications: Current Facility-Administered Medications  Medication Dose Route Frequency Provider Last Rate Last Admin   acetaminophen  (TYLENOL ) tablet 650 mg  650 mg Oral Q6H PRN Starkes-Perry, Takia S, FNP       alum & mag hydroxide-simeth (MAALOX/MYLANTA) 200-200-20 MG/5ML suspension 30 mL  30 mL Oral Q4H PRN Starkes-Perry, Takia S, FNP       haloperidol  (HALDOL ) tablet 5 mg  5 mg Oral BID Jadapalle, Sree, MD   5 mg at 07/17/24 0945   Or   haloperidol  lactate (HALDOL ) injection 10 mg  10 mg Intramuscular BID Jadapalle, Sree, MD       HYDROcodone -acetaminophen  (NORCO/VICODIN) 5-325 MG per tablet 1-2 tablet  1-2 tablet Oral Q4H PRN Starkes-Perry, Takia S, FNP       magnesium  hydroxide (MILK OF MAGNESIA) suspension 30 mL  30 mL Oral Daily PRN Starkes-Perry, Takia S, FNP       multivitamin with minerals tablet 1 tablet  1 tablet Oral Daily Wilkie Majel RAMAN, FNP   1 tablet at 07/17/24 0945   OLANZapine  (ZYPREXA ) injection 5 mg  5 mg Intramuscular TID PRN Wilkie Majel RAMAN, FNP       OLANZapine  (ZYPREXA ) tablet 15 mg  15 mg Oral BID Jadapalle, Sree, MD   15 mg at 07/17/24 9051   OLANZapine  zydis (ZYPREXA ) disintegrating tablet 5 mg  5 mg Oral TID PRN Starkes-Perry, Takia S, FNP       ondansetron  (ZOFRAN ) tablet 4 mg  4 mg Oral Q6H PRN Wilkie Majel RAMAN, FNP       Or   ondansetron   (ZOFRAN ) injection 4 mg  4 mg Intravenous Q6H PRN Starkes-Perry, Majel RAMAN, FNP       thiamine  (VITAMIN B1) tablet 100 mg  100 mg Oral Daily Wilkie Majel RAMAN, FNP   100 mg at 07/17/24 0945   traZODone  (DESYREL ) tablet 50 mg  50 mg Oral QHS Starkes-Perry, Takia S, FNP   50 mg at 07/16/24 2153    Lab Results:  No results found for this or any previous visit (from the past 48 hours).   Blood Alcohol level:  Lab Results  Component Value Date   [facility] <15 05/02/2024   ETH <15 05/01/2024  Metabolic Disorder Labs: Lab Results  Component Value Date   HGBA1C 5.6 05/14/2024   MPG 114.02 05/14/2024   MPG 116.89 05/02/2024   Lab Results  Component Value Date   PROLACTIN 13.6 04/16/2015   Lab Results  Component Value Date   CHOL 219 (H) 05/14/2024   TRIG 84 05/14/2024   HDL 83 05/14/2024   CHOLHDL 2.6 05/14/2024   VLDL 17 05/14/2024   LDLCALC 119 (H) 05/14/2024   LDLCALC 130 (H) 05/01/2024    Physical Findings: AIMS:  , ,  ,  ,    CIWA:    COWS:      Psychiatric Specialty Exam:  Presentation  General Appearance:  Casual  Eye Contact: Fair  Speech: Normal rate  Speech Volume: Increased    Mood and Affect  Mood: Angry  Affect: Congruent   Thought Process  Thought Processes: Disorganized  Descriptions of Associations:Tangential  Orientation:Partial  Thought Content:Illogical; Tangential  Hallucinations:RESPONDING TO INTERNAL STIMULI Ideas of Reference:Paranoia; Delusions; Percusatory  Suicidal Thoughts:not endorsing Homicidal Thoughts:nor endorsing  Sensorium  Memory: Recent Poor  Judgment: Poor  Insight: Poor   Executive Functions  Concentration: Poor  Attention Span: Poor  Recall: Poor  Fund of Knowledge: Poor  Language: Poor   Psychomotor Activity  Psychomotor Activity:No data recorded Musculoskeletal: Strength & Muscle Tone: within normal limits Gait & Station: normal Assets   Assets: Resilience    Physical Exam: Physical Exam Vitals and nursing note reviewed.    ROS Blood pressure 102/66, pulse 62, temperature 97.8 F (36.6 C), resp. rate 17, height 5' 4 (1.626 m), weight 44.9 kg, SpO2 100%. Body mass index is 16.99 kg/m.  Diagnosis: Active Problems:   Schizophrenia, paranoid (HCC) Paranoid  PLAN: Safety and Monitoring:  -- Involuntary admission to inpatient psychiatric unit for safety, stabilization and treatment  -- Daily contact with patient to assess and evaluate symptoms and progress in treatment  -- Patient's case to be discussed in multi-disciplinary team meeting  -- Observation Level : q15 minute checks  -- Vital signs:  q12 hours  -- Precautions: suicide, elopement, and assault -- Encouraged patient to participate in unit milieu and in scheduled group therapies  2. Psychiatric Diagnoses and Treatment: Patient  2 physicians approved non emergent forced medications legal guardian gave consent for Haldol  oral or IM  Zyprexa  15 mg BID  Haldol  10 mg bid added - Plan for LAI haldol  Dec 100mg  Q4 weeks DSS gave consent-nonemergent forced medication consented by legal guardian  . Discharge Planning:   -- Social work and case management to assist with discharge planning and identification of hospital follow-up needs prior to discharge  -- Estimated LOS: 3-4 days  Desmond Chimera, MD 07/17/2024, 1:00 PM

## 2024-07-17 NOTE — Progress Notes (Signed)
 Behavior:  Pleasant and cooperative.   Psych assessment: Denies SI/HI.  Interaction / Group attendance:  Present in the milieu for meals (sometimes spends time in milieu before meals).   Medication/ PRNs:  Compliant.  Pain: Denies.  15 min checks in place for safety.

## 2024-07-17 NOTE — Group Note (Signed)
 Recreation Therapy Group Note   Group Topic:Coping Skills  Group Date: 07/17/2024 Start Time: 1415 End Time: 1440 Facilitators: Celestia Jeoffrey BRAVO, LRT, CTRS Location: Courtyard  Group Description: Music. Patients are encouraged to name their favorite song(s) for LRT to play song through speaker for group to hear, while in the courtyard getting fresh air and sunlight. Patients educated on the definition of leisure and the importance of having different leisure interests outside of the hospital. Group discussed how leisure activities can often be used as pharmacologist and that listening to music and being outside are examples.    Goal Area(s) Addressed:  Patient will identify a current leisure interest.  Patient will practice making a positive decision. Patient will have the opportunity to try a new leisure activity.   Affect/Mood: [name]   Participation Level: Did not attend    Clinical Observations/Individualized Feedback: Patient did not attend.  Plan: Continue to engage patient in RT group sessions 2-3x/week.   Jeoffrey BRAVO Celestia, LRT, CTRS 07/17/2024 4:30 PM

## 2024-07-17 NOTE — Group Note (Signed)
 Date:  07/17/2024 Time:  11:07 PM  Group Topic/Focus:  Wrap-Up Group:   The focus of this group is to help patients review their daily goal of treatment and discuss progress on daily workbooks.    Participation Level:  Did Not Attend  Participation Quality:     Affect:     Cognitive:     Insight: None  Engagement in Group:  None  Modes of Intervention:     Additional Comments:    [name] [name] 07/17/2024, 11:07 PM

## 2024-07-17 NOTE — Plan of Care (Signed)
" °  Problem: Coping: Goal: Ability to demonstrate self-control will improve Outcome: Progressing   Problem: Education: Goal: Verbalization of understanding the information provided will improve Outcome: Not Progressing   Problem: Activity: Goal: Interest or engagement in activities will improve Outcome: Not Progressing   "

## 2024-07-18 NOTE — Progress Notes (Signed)
 Phone call back [facility] MD Progress Note  07/18/2024 12:36 PM [name]  MRN:  [id number]  [name] is a 63 y.o. female admitted: Medicallyfor 05/02/2024 10:56 AM for dehydration, failure to thrive and hypoglycemia. She carries the psychiatric diagnoses of Schizophrenia and has medical hx of hypoglycemia and failure to thrive and has a past medical history of sinus bradycardia, chronic anemia, severe malnutrition and recurrent hypoglycemia.. Her current presentation of verbal aggression, agitation and refusal to eat with severe paranoia is most consistent with paranoid schizophrenia. [name]  She was at [facility] where she was admitted on 04/30/2024.  She was transferred to the ED because of failure to thrive and was admitted to medical floor.Patient is on IVC and patient has been adamantly refusing all oral medications and refusing to eat.  Patient is admitted to [facility] unit with Q15 min safety monitoring. Multidisciplinary team approach is offered. Medication management; group/milieu therapy is offered.    Subjective:  Chart reviewed, case discussed in multidisciplinary meeting, patient seen during rounds.   Per staff report patient continues to be irritable and asked them to leave the room when asked about psychiatric symptoms.  Otherwise patient is mostly okay.  Patient on interview reports that she does not want to talk to this clinical research associate.  Patient continues to be noted to be responding to internal stimuli while she is in her room.  Patient during interview got irritated stating that it is all illegal... Government is watching .  Per staff report who have cared for the patient in the past there reports that this is patient baseline.  Patient cannot return back home with mom as she has agitation and aggression with the mom.  Is child psychotherapist working on finding a place for the patient.    Past Psychiatric History: see h&P Family History: History reviewed. No pertinent family history. Social History:   Social History   Substance and Sexual Activity  Alcohol Use Not Currently   Comment: Refuses to disclose how much     Social History   Substance and Sexual Activity  Drug Use No   Comment: Refuses to answer    Social History   Socioeconomic History   Marital status: Single    Spouse name: Not on file   Number of children: Not on file   Years of education: Not on file   Highest education level: Not on file  Occupational History   Not on file  Tobacco Use   Smoking status: Every Day    Current packs/day: 2.00    Types: Cigarettes    Passive exposure: Current   Smokeless tobacco: Never  Vaping Use   Vaping status: Never Used  Substance and Sexual Activity   Alcohol use: Not Currently    Comment: Refuses to disclose how much   Drug use: No    Comment: Refuses to answer   Sexual activity: Not Currently    Comment: refused to answer  Other Topics Concern   Not on file  Social History Narrative   Not on file   Social Drivers of Health   Tobacco Use: High Risk (05/06/2024)   Patient History    Smoking Tobacco Use: Every Day    Smokeless Tobacco Use: Never    Passive Exposure: Current  Financial Resource Strain: Not on file  Food Insecurity: Patient Declined (05/06/2024)   Epic    Worried About Programme Researcher, Broadcasting/film/video in the Last Year: Patient declined    Barista in the  Last Year: Patient declined  Transportation Needs: Patient Declined (05/06/2024)   Epic    Lack of Transportation (Medical): Patient declined    Lack of Transportation (Non-Medical): Patient declined  Physical Activity: Not on file  Stress: Not on file  Social Connections: Not on file  Depression (EYV7-0): Not on file  Alcohol Screen: Low Risk (05/06/2024)   Alcohol Screen    Last Alcohol Screening Score (AUDIT): 0  Housing: Unknown (05/06/2024)   Epic    Unable to Pay for Housing in the Last Year: Patient declined    Number of Times Moved in the Last Year: Not on file    Homeless in  the Last Year: Not on file  Utilities: Patient Declined (05/06/2024)   Epic    Threatened with loss of utilities: Patient declined  Health Literacy: Not on file   Past Medical History:  Past Medical History:  Diagnosis Date   Anemia 10/02/2021   Non compliance w medication regimen    Schizophrenia (HCC)     Past Surgical History:  Procedure Laterality Date   BIOPSY  09/25/2021   Procedure: BIOPSY;  Surgeon: Teressa Toribio SQUIBB, MD;  Location: [facility];  Service: Gastroenterology;;   ESOPHAGOGASTRODUODENOSCOPY (EGD) WITH PROPOFOL  [name] 09/25/2021   Procedure: ESOPHAGOGASTRODUODENOSCOPY (EGD) WITH PROPOFOL ;  Surgeon: Teressa Toribio SQUIBB, MD;  Location: [facility];  Service: Gastroenterology;  Laterality: [name];  With NGT placement    Current Medications: Current Facility-Administered Medications  Medication Dose Route Frequency Provider Last Rate Last Admin   acetaminophen  (TYLENOL ) tablet 650 mg  650 mg Oral Q6H PRN Starkes-Perry, Takia S, FNP       alum & mag hydroxide-simeth (MAALOX/MYLANTA) 200-200-20 MG/5ML suspension 30 mL  30 mL Oral Q4H PRN Starkes-Perry, Takia S, FNP       haloperidol  (HALDOL ) tablet 5 mg  5 mg Oral BID Jadapalle, Sree, MD   5 mg at 07/18/24 9059   Or   haloperidol  lactate (HALDOL ) injection 10 mg  10 mg Intramuscular BID Jadapalle, Sree, MD       HYDROcodone -acetaminophen  (NORCO/VICODIN) 5-325 MG per tablet 1-2 tablet  1-2 tablet Oral Q4H PRN Starkes-Perry, Takia S, FNP       magnesium  hydroxide (MILK OF MAGNESIA) suspension 30 mL  30 mL Oral Daily PRN Starkes-Perry, Takia S, FNP       multivitamin with minerals tablet 1 tablet  1 tablet Oral Daily Wilkie Majel RAMAN, FNP   1 tablet at 07/18/24 0940   OLANZapine  (ZYPREXA ) injection 5 mg  5 mg Intramuscular TID PRN Wilkie Majel RAMAN, FNP       OLANZapine  (ZYPREXA ) tablet 15 mg  15 mg Oral BID Jadapalle, Sree, MD   15 mg at 07/18/24 0940   OLANZapine  zydis (ZYPREXA ) disintegrating tablet 5 mg  5 mg Oral TID  PRN Starkes-Perry, Takia S, FNP       ondansetron  (ZOFRAN ) tablet 4 mg  4 mg Oral Q6H PRN Wilkie Majel RAMAN, FNP       Or   ondansetron  (ZOFRAN ) injection 4 mg  4 mg Intravenous Q6H PRN Starkes-Perry, Majel RAMAN, FNP       thiamine  (VITAMIN B1) tablet 100 mg  100 mg Oral Daily Wilkie Majel RAMAN, FNP   100 mg at 07/18/24 0940   traZODone  (DESYREL ) tablet 50 mg  50 mg Oral QHS Starkes-Perry, Takia S, FNP   50 mg at 07/17/24 2159    Lab Results:  No results found for this or any previous visit (from the past 48  hours).   Blood Alcohol level:  Lab Results  Component Value Date   [facility] <15 05/02/2024   ETH <15 05/01/2024    Metabolic Disorder Labs: Lab Results  Component Value Date   HGBA1C 5.6 05/14/2024   MPG 114.02 05/14/2024   MPG 116.89 05/02/2024   Lab Results  Component Value Date   PROLACTIN 13.6 04/16/2015   Lab Results  Component Value Date   CHOL 219 (H) 05/14/2024   TRIG 84 05/14/2024   HDL 83 05/14/2024   CHOLHDL 2.6 05/14/2024   VLDL 17 05/14/2024   LDLCALC 119 (H) 05/14/2024   LDLCALC 130 (H) 05/01/2024    Physical Findings: AIMS:  , ,  ,  ,    CIWA:    COWS:      Psychiatric Specialty Exam:  Presentation  General Appearance:  Casual  Eye Contact: Fair  Speech: Normal rate  Speech Volume: Increased    Mood and Affect  Mood: Angry  Affect: Congruent   Thought Process  Thought Processes: Disorganized  Descriptions of Associations:Tangential  Orientation:Partial  Thought Content:Illogical; Tangential  Hallucinations:RESPONDING TO INTERNAL STIMULI Ideas of Reference:Paranoia; Delusions; Percusatory  Suicidal Thoughts:not endorsing Homicidal Thoughts:nor endorsing  Sensorium  Memory: Recent Poor  Judgment: Poor  Insight: Poor   Executive Functions  Concentration: Poor  Attention Span: Poor  Recall: Poor  Fund of Knowledge: Poor  Language: Poor   Psychomotor Activity  Psychomotor Activity:No  data recorded Musculoskeletal: Strength & Muscle Tone: within normal limits Gait & Station: normal Assets  Assets: Resilience    Physical Exam: Physical Exam Vitals and nursing note reviewed.    ROS Blood pressure (!) 124/50, pulse (!) 55, temperature (!) 97.3 F (36.3 C), resp. rate 16, height 5' 4 (1.626 m), weight 44.9 kg, SpO2 100%. Body mass index is 16.99 kg/m.  Diagnosis: Active Problems:   Schizophrenia, paranoid (HCC) Paranoid  PLAN: Safety and Monitoring:  -- Involuntary admission to inpatient psychiatric unit for safety, stabilization and treatment  -- Daily contact with patient to assess and evaluate symptoms and progress in treatment  -- Patient's case to be discussed in multi-disciplinary team meeting  -- Observation Level : q15 minute checks  -- Vital signs:  q12 hours  -- Precautions: suicide, elopement, and assault -- Encouraged patient to participate in unit milieu and in scheduled group therapies  2. Psychiatric Diagnoses and Treatment: Patient  2 physicians approved non emergent forced medications legal guardian gave consent for Haldol  oral or IM  Zyprexa  15 mg BID  Haldol  10 mg bid added - Plan for LAI haldol  Dec 100mg  Q4 weeks DSS gave consent-nonemergent forced medication consented by legal guardian  . Discharge Planning:   -- Social work and case management to assist with discharge planning and identification of hospital follow-up needs prior to discharge  -- Estimated LOS: 3-4 days  Desmond Chimera, MD 07/18/2024, 12:36 PM

## 2024-07-18 NOTE — Plan of Care (Signed)
" °  Problem: Nutrition: Goal: Adequate nutrition will be maintained Outcome: Progressing   Problem: Coping: Goal: Level of anxiety will decrease Outcome: Progressing   Problem: Skin Integrity: Goal: Risk for impaired skin integrity will decrease Outcome: Progressing   Problem: Health Behavior/Discharge Planning: Goal: Compliance with prescribed medication regimen will improve Outcome: Progressing   "

## 2024-07-18 NOTE — Progress Notes (Signed)
" °   07/17/24 2000  Psych Admission Type (Psych Patients Only)  Admission Status Involuntary  Psychosocial Assessment  Patient Complaints None  Eye Contact Avoids  Facial Expression Flat  Affect Flat  Speech Soft  Interaction Minimal  Motor Activity Slow  Appearance/Hygiene In scrubs  Behavior Characteristics Cooperative  Mood Pleasant  Thought Process  Coherency WDL  Content WDL  Delusions None reported or observed  Perception WDL  Hallucination None reported or observed  Judgment Poor  Confusion Mild  Danger to Self  Current suicidal ideation? Denies  Agreement Not to Harm Self Yes  Description of Agreement verbal  Danger to Others  Danger to Others None reported or observed    "

## 2024-07-18 NOTE — Progress Notes (Signed)
(  Sleep Hours) - 9.25 (Any PRNs that were needed, meds refused, or side effects to meds)- None observed/reported (Any disturbances and when (visitation, over night)-None observed/reported (Concerns raised by the patient)- None observed reported (SI/HI/AVH)- Denies

## 2024-07-18 NOTE — Group Note (Signed)
 Recreation Therapy Group Note   Group Topic:Health and Wellness  Group Date: 07/18/2024 Start Time: 1425 End Time: 1445 Facilitators: Celestia Jeoffrey BRAVO, LRT, CTRS Location: Craft Room  Group Description: Seated Exercise. LRT discussed the mental and physical benefits of exercise. LRT and group discussed how physical activity can be used as a coping skill. Pt's and LRT followed along to an exercise video on the TV screen that provided a visual representation and audio description of every exercise performed. Pt's encouraged to listen to their bodies and stop at any time if they experience feelings of discomfort or pain. Pts were encouraged to drink water and stay hydrated.   Goal Area(s) Addressed:  Patient will learn benefits of physical activity. Patient will identify exercise as a coping skill.  Patient will follow multistep directions. Patient will try a new leisure interest.    Affect/Mood: [name]   Participation Level: Did not attend    Clinical Observations/Individualized Feedback: Patient did not attend.   Plan: Continue to engage patient in RT group sessions 2-3x/week.   Jeoffrey BRAVO Celestia, LRT, CTRS 07/18/2024 4:01 PM

## 2024-07-18 NOTE — Group Note (Signed)
 Date:  07/18/2024 Time:  10:39 AM  Group Topic/Focus:   Effective coping skills for seniors involve a mix of mental, physical, and social activities, like mindfulness, light exercise (walking, yoga), creative outlets (art, music, writing), maintaining routines, and fostering social connections, which all reduce stress, combat loneliness, boost mood, and build resilience for life's changes. Deep breathing and journaling also offer immediate calm, while therapy can provide professional support.   Participation Level:  Did Not Attend   [name] 07/18/2024, 10:39 AM

## 2024-07-18 NOTE — Group Note (Signed)
 LCSW Group Therapy Note   Group Date: 07/18/2024 Start Time: 1330 End Time: 1400   Type of Therapy and Topic:  Group Therapy: Challenging Core Beliefs  Participation Level:  Did Not Attend  Description of Group:  Patients were educated about core beliefs and asked to identify one harmful core belief that they have. Patients were asked to explore from where those beliefs originate. Patients were asked to discuss how those beliefs make them feel and the resulting behaviors of those beliefs. They were then be asked if those beliefs are true and, if so, what evidence they have to support them. Lastly, group members were challenged to replace those negative core beliefs with helpful beliefs.   Therapeutic Goals:   1. Patient will identify harmful core beliefs and explore the origins of such beliefs. 2. Patient will identify feelings and behaviors that result from those core beliefs. 3. Patient will discuss whether such beliefs are true. 4.  Patient will replace harmful core beliefs with helpful ones.  Summary of Patient Progress: X  Therapeutic Modalities: Cognitive Behavioral Therapy; Solution-Focused Therapy   [name], [place] 07/18/2024  1:57 PM

## 2024-07-18 NOTE — Group Note (Signed)
 Date:  07/18/2024 Time:  8:33 PM  Group Topic/Focus:  Wrap-Up Group:   The focus of this group is to help patients review their daily goal of treatment and discuss progress on daily workbooks.    Participation Level:  Did Not Attend  [name] 07/18/2024, 8:33 PM

## 2024-07-18 NOTE — Progress Notes (Signed)
 Behavior:  Mild confusion.  Cooperative.   Psych assessment:  Denies SI/HI.  Interaction / Group attendance:  Present in the milieu for meals and short time before. No interaction with peers.  Medication/ PRNs:  Compliant.  Pain:  Denies  15 min checks in place for safety.

## 2024-07-19 NOTE — Group Note (Signed)
 Date:  07/19/2024 Time:  3:37 PM  Group Topic/Focus:  Coping With Mental Health Crisis:   The purpose of this group is to help patients identify strategies for coping with mental health crisis.  Group discusses possible causes of crisis and ways to manage them effectively.    Participation Level:  Did Not Attend    [name] Bias 07/19/2024, 3:37 PM

## 2024-07-19 NOTE — Plan of Care (Signed)
  Problem: Education: Goal: Knowledge of [place] [facility] Education information/materials will improve Outcome: Progressing Goal: Emotional status will improve Outcome: Progressing Goal: Mental status will improve Outcome: Progressing Goal: Verbalization of understanding the information provided will improve Outcome: Progressing   Problem: Activity: Goal: Interest or engagement in activities will improve Outcome: Progressing Goal: Sleeping patterns will improve Outcome: Progressing   Problem: Coping: Goal: Ability to verbalize frustrations and anger appropriately will improve Outcome: Progressing Goal: Ability to demonstrate self-control will improve Outcome: Progressing   Problem: Health Behavior/Discharge Planning: Goal: Identification of resources available to assist in meeting health care needs will improve Outcome: Progressing Goal: Compliance with treatment plan for underlying cause of condition will improve Outcome: Progressing   Problem: Physical Regulation: Goal: Ability to maintain clinical measurements within normal limits will improve Outcome: Progressing   Problem: Safety: Goal: Periods of time without injury will increase Outcome: Progressing   Problem: Education: Goal: Knowledge of General Education information will improve Description: Including pain rating scale, medication(s)/side effects and non-pharmacologic comfort measures Outcome: Progressing   Problem: Health Behavior/Discharge Planning: Goal: Ability to manage health-related needs will improve Outcome: Progressing   Problem: Clinical Measurements: Goal: Ability to maintain clinical measurements within normal limits will improve Outcome: Progressing Goal: Will remain free from infection Outcome: Progressing Goal: Diagnostic test results will improve Outcome: Progressing Goal: Respiratory complications will improve Outcome: Progressing Goal: Cardiovascular complication will be  avoided Outcome: Progressing   Problem: Activity: Goal: Risk for activity intolerance will decrease Outcome: Progressing   Problem: Nutrition: Goal: Adequate nutrition will be maintained Outcome: Progressing   Problem: Coping: Goal: Level of anxiety will decrease Outcome: Progressing   Problem: Elimination: Goal: Will not experience complications related to bowel motility Outcome: Progressing Goal: Will not experience complications related to urinary retention Outcome: Progressing   Problem: Pain Managment: Goal: General experience of comfort will improve and/or be controlled Outcome: Progressing   Problem: Safety: Goal: Ability to remain free from injury will improve Outcome: Progressing   Problem: Skin Integrity: Goal: Risk for impaired skin integrity will decrease Outcome: Progressing   Problem: Health Behavior/Discharge Planning: Goal: Compliance with prescribed medication regimen will improve Outcome: Progressing   Problem: Nutritional: Goal: Ability to achieve adequate nutritional intake will improve Outcome: Progressing

## 2024-07-19 NOTE — Progress Notes (Signed)
 Patient is an involuntary admission to [facility] for schizophrenia / paranoia with APS involvement awaiting placement.  Patient is isolative but comes out for meals and sometimes to watch tv. Takes all of her meds.  Denies SI, HI, AVH, anxiety and depression but seems to respond to internal stimuli.  Will continue to monitor.

## 2024-07-19 NOTE — Group Note (Signed)
 Date:  07/19/2024 Time:  10:37 AM  Group Topic/Focus:    For geriatric patients, combining gentle, low-impact exercises like walking, chair yoga, or Tai Chi with mindfulness and meditation (breathing, body scans) significantly boosts physical (balance, strength) and mental (less anxiety/depression, better focus) health, improving overall well-being by managing chronic pain and enhancing emotional resilience. Adaptations are key: use chairs for stability, focus on simple guided sessions, and prioritize comfort for balance, mobility, and sensory issues.  Meditation is a simple yet powerful way to support your emotional and physical well-being. Regular practice can help lower blood pressure, ease anxiety, and improve focus and sleep. From deep breathing and body scans to guided imagery and mindfulness, there are many styles to explore.  Participation Level:  Did Not Attend  [name] [place] 07/19/2024, 10:37 AM

## 2024-07-19 NOTE — Progress Notes (Signed)
" °   07/18/24 2100  Psych Admission Type (Psych Patients Only)  Admission Status Involuntary  Psychosocial Assessment  Patient Complaints None  Eye Contact Avoids  Facial Expression Flat  Affect Irritable  Speech Soft  Interaction Guarded;Minimal  Motor Activity Slow  Appearance/Hygiene In scrubs  Behavior Characteristics Cooperative;Appropriate to situation  Mood Pleasant  Thought Process  Coherency WDL  Content WDL  Delusions None reported or observed  Perception WDL  Hallucination None reported or observed  Judgment Poor  Confusion Mild  Danger to Self  Current suicidal ideation? Denies  Agreement Not to Harm Self Yes  Description of Agreement verbal  Danger to Others  Danger to Others None reported or observed   Mood/Behavior: Calm and cooperative.   Psych assessment: Denies SI/HI and AVH.     Interaction / Group attendance: Isolative to self. Minimal interaction with peers and staff.  Did not attend group.   Medication/ PRNs: Compliant with scheduled meds. No prns required.     Pain: Denies     15 min checks in place for safety. "

## 2024-07-19 NOTE — BH IP Treatment Plan (Signed)
 Interdisciplinary Treatment and Diagnostic Plan Update  07/19/2024 Time of Session: 3:00 PM  [name] MRN: [id number]  Principal Diagnosis: <principal problem not specified>  Secondary Diagnoses: Active Problems:   Schizophrenia, paranoid (HCC)   Current Medications:  Current Facility-Administered Medications  Medication Dose Route Frequency Provider Last Rate Last Admin   acetaminophen  (TYLENOL ) tablet 650 mg  650 mg Oral Q6H PRN Starkes-Perry, Takia S, FNP       alum & mag hydroxide-simeth (MAALOX/MYLANTA) 200-200-20 MG/5ML suspension 30 mL  30 mL Oral Q4H PRN Starkes-Perry, Takia S, FNP       haloperidol  (HALDOL ) tablet 5 mg  5 mg Oral BID Jadapalle, Sree, MD   5 mg at 07/19/24 9148   Or   haloperidol  lactate (HALDOL ) injection 10 mg  10 mg Intramuscular BID Jadapalle, Sree, MD       HYDROcodone -acetaminophen  (NORCO/VICODIN) 5-325 MG per tablet 1-2 tablet  1-2 tablet Oral Q4H PRN Starkes-Perry, Takia S, FNP       magnesium  hydroxide (MILK OF MAGNESIA) suspension 30 mL  30 mL Oral Daily PRN Starkes-Perry, Takia S, FNP       multivitamin with minerals tablet 1 tablet  1 tablet Oral Daily Wilkie Majel RAMAN, FNP   1 tablet at 07/19/24 9148   OLANZapine  (ZYPREXA ) injection 5 mg  5 mg Intramuscular TID PRN Wilkie Majel RAMAN, FNP       OLANZapine  (ZYPREXA ) tablet 15 mg  15 mg Oral BID Jadapalle, Sree, MD   15 mg at 07/19/24 9147   OLANZapine  zydis (ZYPREXA ) disintegrating tablet 5 mg  5 mg Oral TID PRN Starkes-Perry, Takia S, FNP       ondansetron  (ZOFRAN ) tablet 4 mg  4 mg Oral Q6H PRN Starkes-Perry, Majel RAMAN, FNP       Or   ondansetron  (ZOFRAN ) injection 4 mg  4 mg Intravenous Q6H PRN Starkes-Perry, Majel RAMAN, FNP       thiamine  (VITAMIN B1) tablet 100 mg  100 mg Oral Daily Wilkie Majel RAMAN, FNP   100 mg at 07/19/24 9148   traZODone  (DESYREL ) tablet 50 mg  50 mg Oral QHS Starkes-Perry, Takia S, FNP   50 mg at 07/18/24 2135   PTA Medications: Medications Prior to  Admission  Medication Sig Dispense Refill Last Dose/Taking   feeding supplement (ENSURE PLUS HIGH PROTEIN) LIQD Take 237 mLs by mouth 3 (three) times daily between meals.      Multiple Vitamin (MULTIVITAMIN WITH MINERALS) TABS tablet Take 1 tablet by mouth daily.      OLANZapine  (ZYPREXA ) 5 MG tablet Take 1 tablet (5 mg total) by mouth 2 (two) times daily.      OLANZapine  (ZYPREXA ) injection Inject 1 mL (5 mg total) into the muscle 2 (two) times daily.      thiamine  (VITAMIN B-1) 100 MG tablet Take 1 tablet (100 mg total) by mouth daily.      traZODone  (DESYREL ) 50 MG tablet Take 1 tablet (50 mg total) by mouth at bedtime.       Patient Stressors:    Patient Strengths:    Treatment Modalities: Medication Management, Group therapy, Case management,  1 to 1 session with clinician, Psychoeducation, Recreational therapy.   Physician Treatment Plan for Primary Diagnosis: <principal problem not specified> Long Term Goal(s): Improvement in symptoms so as ready for discharge   Short Term Goals: Ability to identify changes in lifestyle to reduce recurrence of condition will improve  Medication Management: Evaluate patient's response, side effects, and tolerance of medication regimen.  Therapeutic Interventions: 1 to 1 sessions, Unit Group sessions and Medication administration.  Evaluation of Outcomes: Progressing  Physician Treatment Plan for Secondary Diagnosis: Active Problems:   Schizophrenia, paranoid (HCC)  Long Term Goal(s): Improvement in symptoms so as ready for discharge   Short Term Goals: Ability to identify changes in lifestyle to reduce recurrence of condition will improve     Medication Management: Evaluate patient's response, side effects, and tolerance of medication regimen.  Therapeutic Interventions: 1 to 1 sessions, Unit Group sessions and Medication administration.  Evaluation of Outcomes: Progressing   RN Treatment Plan for Primary Diagnosis: <principal problem  not specified> Long Term Goal(s): Knowledge of disease and therapeutic regimen to maintain health will improve  Short Term Goals: Ability to remain free from injury will improve, Ability to verbalize frustration and anger appropriately will improve, Ability to demonstrate self-control, Ability to participate in decision making will improve, Ability to verbalize feelings will improve, Ability to disclose and discuss suicidal ideas, Ability to identify and develop effective coping behaviors will improve, and Compliance with prescribed medications will improve  Medication Management: RN will administer medications as ordered by provider, will assess and evaluate patient's response and provide education to patient for prescribed medication. RN will report any adverse and/or side effects to prescribing provider.  Therapeutic Interventions: 1 on 1 counseling sessions, Psychoeducation, Medication administration, Evaluate responses to treatment, Monitor vital signs and CBGs as ordered, Perform/monitor CIWA, COWS, AIMS and Fall Risk screenings as ordered, Perform wound care treatments as ordered.  Evaluation of Outcomes: Progressing   LCSW Treatment Plan for Primary Diagnosis: <principal problem not specified> Long Term Goal(s): Safe transition to appropriate next level of care at discharge, Engage patient in therapeutic group addressing interpersonal concerns.  Short Term Goals: Engage patient in aftercare planning with referrals and resources, Increase social support, Increase ability to appropriately verbalize feelings, Increase emotional regulation, Facilitate acceptance of mental health diagnosis and concerns, Facilitate patient progression through stages of change regarding substance use diagnoses and concerns, Identify triggers associated with mental health/substance abuse issues, and Increase skills for wellness and recovery  Therapeutic Interventions: Assess for all discharge needs, 1 to 1 time with  Social worker, Explore available resources and support systems, Assess for adequacy in community support network, Educate family and significant other(s) on suicide prevention, Complete Psychosocial Assessment, Interpersonal group therapy.  Evaluation of Outcomes: Progressing   Progress in Treatment: Attending groups: No. Participating in groups: No. Taking medication as prescribed: Yes. Toleration medication: Yes. Family/Significant other contact made: No, will contact:  contact has been made with DSS as well as patients brother. Patient understands diagnosis: No. Discussing patient identified problems/goals with staff: No. Medical problems stabilized or resolved: Yes. Denies suicidal/homicidal ideation: Yes. Issues/concerns per patient self-inventory: No. Other: none   New problem(s) identified: None identified. Update: 05/13/2024 No changes at this time. Update 05/18/2024:  No changes at this time. Update 05/23/2024:  No changes at this time. Update 05/28/2024:  No changes at this time. Update 06/02/2024:  No changes at this time. Update 06/07/2024:  No changes at this time. 06/12/24 Update: Patient has been isolative to her room with minimal interaction. Update 06/18/24:  No changes at this time  Update 06/24/24:  No changes at this time Update 06/29/24:  No changes at this time 07/04/24 Update: No changes. 07/09/24 07/14/2024: No changes at this time.  Update 07/19/24: No changes at this time    New Short Term/Long Term Goal(s): elimination of symptoms of psychosis, medication management for mood  stabilization; elimination of SI thoughts; development of comprehensive mental wellness plan. Update: 05/13/2024 No changes at this time.Update: 05/18/2024 No changes at this time. Update 05/23/2024:  No changes at this time. Update 05/28/2024:  No changes at this time. Update 06/02/2024:  No changes at this time. Update 06/07/2024:  No changes at this time. 06/12/24 Update: No changes at this time.  Update 06/18/24:  No changes at this time  Update 06/24/24:  No changes at this time Update 06/29/24:  No changes at this time 07/04/24 Update: No changes. 07/09/24: No change 07/14/2024: No changes at this time.  Update 07/19/24: No changes at this time    Patient Goals:   I just need to leave, I don't need it hereUpdate: 05/13/2024 No changes at this time.Update: 05/18/2024 No changes at this time. Update 05/23/2024:  No changes at this time.Update 05/28/2024:  No changes at this time. Update 06/02/2024:  No changes at this time. Update 06/07/2024:  No changes at this time. 06/12/24 Update: No changes at this time. Update 06/18/24:  No changes at this time Update 06/24/24:  No changes at this time Update 06/29/24:  No changes at this time 07/04/24 Update: No changes. 07/09/24: No changes at this time.  07/14/2024: No changes at this time.   Update 07/19/24: No changes at this time    Discharge Plan or Barriers: CSW will assist with appropriate discharge planning. Update: 05/13/2024 No changes at this time.Update: 05/18/2024 No changes at this time. Update 05/23/2024:  No changes at this time.Update 05/28/2024:  CSW unable to identify appropriate decision maker, as pt's legal guardian has passed and CSW has reached out to APS caseworker multiple times to get clarity on protocol for enforcing medication until an appropriate party has been legal identified Update 06/02/2024:  No changes at this time. Update 06/07/2024:  CSW and provider still working to clarify appropriate horticulturist, commercial. Please refer to extensive notes in medical record. 06/12/24 Update: CSW and provider still working with DSS to obtain legal decision maker on patient's behalf. Update 06/18/24:  No changes at this time Update 06/24/24:  DSS received ex-parte, DSS to begin looking for placement, Dr.J to initiate enforced medication Update 06/29/24:  No changes at this time 07/04/24 Update: CSW to continue to provide support to APS case  worker while DSS works on clinical cytogeneticist. No changes. 07/09/24 07/14/2024: Possible bed at [facility], CSW team to follow up with guardian regarding this.   Update 07/19/24: No changes at this time    Reason for Continuation of Hospitalization: Aggression Hallucinations   Estimated Length of Stay:1 to 7 days. Update: 05/13/2024 TBDUpdate: 05/18/2024 TBD Update 05/23/2024:  TBD Update 05/28/2024:  TBDUpdate 06/02/2024:  TBD Update 06/07/2024:  TBD 06/12/24 Update: TBD. Update 06/18/24:  TBD Update 06/24/24:  TBD Update 06/29/24:  TBD 07/04/24 Update: TBD.Update,  07/09/24 07/14/2024: TBD  Update 07/19/24: TBD    Last 3 [place] Suicide Severity Risk Score: Flowsheet Row Admission (Current) from 05/06/2024 in [facility] to Hosp-Admission (Discharged) from 05/02/2024 in [facility] ED from 04/30/2024 in [facility]  C-SSRS RISK CATEGORY No Risk No Risk No Risk    Last [facility] 2/9 Scores:     No data to display          Scribe for Treatment Team: [name] JONETTA Raynaldo ISRAEL 07/19/2024 3:38 PM

## 2024-07-20 NOTE — Group Note (Signed)
 Recreation Therapy Group Note   Group Topic:Goal Setting  Group Date: 07/20/2024 Start Time: 1330 End Time: 1405 Facilitators: Celestia Jeoffrey BRAVO, LRT, CTRS Location: Dayroom  Group Description: New Years Resolutions. Patients received a handout for them to write down their new years resolutions on it. The handout had a decorative border around the paper that patients could also color once they're finished writing their new year resolutions down. Afterwards, LRT encouraged patients to read out loud their new years resolutions to the group.   Goal Area(s) Addressed: Patient will increase communication,  Patient will identify a goal leading into the new year.  Patient will foster mutual support. Patient will express themselves through art.    Affect/Mood: [name]   Participation Level: Did not attend    Clinical Observations/Individualized Feedback: Patient did not attend.  Plan: Continue to engage patient in RT group sessions 2-3x/week.   Jeoffrey BRAVO Celestia, LRT, CTRS 07/20/2024 4:14 PM

## 2024-07-20 NOTE — Group Note (Signed)
 [facility] LCSW Group Therapy Note   Group Date: 07/20/2024 Start Time: 1300 End Time: 1330  Type of Therapy and Topic:  Group Therapy:  Feelings around Relapse and Recovery  Participation Level:  Did Not Attend   Description of Group:    Patients in this group will discuss emotions they experience before and after a relapse. They will process how experiencing these feelings, or avoidance of experiencing them, relates to having a relapse. Facilitator will guide patients to explore emotions they have related to recovery. Patients will be encouraged to process which emotions are more powerful. They will be guided to discuss the emotional reaction significant others in their lives may have to patients relapse or recovery. Patients will be assisted in exploring ways to respond to the emotions of others without this contributing to a relapse.  Therapeutic Goals: Patient will identify two or more emotions that lead to relapse for them:  Patient will identify two emotions that result when they relapse:  Patient will identify two emotions related to recovery:  Patient will demonstrate ability to communicate their needs through discussion and/or role plays.   Summary of Patient Progress: Patient did not attend group.   Therapeutic Modalities:   Cognitive Behavioral Therapy Solution-Focused Therapy Assertiveness Training Relapse Prevention Therapy   Nadara JONELLE Fam, LCSW

## 2024-07-20 NOTE — Progress Notes (Signed)
 Patient accepted medications without issue. Denies SI/HI/AVH. Can be observed responding to internal stimuli. Remained in her room for most of shift. Out for meals. LBM 12/31  07/20/24 1300  Psych Admission Type (Psych Patients Only)  Admission Status Involuntary  Psychosocial Assessment  Patient Complaints None  Eye Contact Avoids  Facial Expression Flat  Affect Irritable  Speech Soft  Interaction Guarded  Motor Activity Slow  Appearance/Hygiene In scrubs  Behavior Characteristics Cooperative  Mood Pleasant  Thought Process  Coherency WDL  Content WDL  Delusions None reported or observed  Perception WDL  Hallucination None reported or observed  Judgment Poor  Confusion Mild  Danger to Self  Current suicidal ideation? Denies  Danger to Others  Danger to Others None reported or observed

## 2024-07-20 NOTE — Group Note (Signed)
 Date:  07/20/2024 Time:  11:02 AM  Group Topic/Focus:  Making Healthy Choices:   The focus of this group is to help patients identify negative/unhealthy choices they were using prior to admission and identify positive/healthier coping strategies to replace them upon discharge.    Participation Level:  Did Not Attend

## 2024-07-20 NOTE — Group Note (Signed)
 Date:  07/20/2024 Time:  3:39 PM  Group Topic/Focus:  Goals Group:   The focus of this group is to help patients establish daily goals to achieve during treatment and discuss how the patient can incorporate goal setting into their daily lives to aide in recovery.    Participation Level:  Did Not Attend   [name] 07/20/2024, 3:39 PM

## 2024-07-20 NOTE — Plan of Care (Signed)
   Problem: Education: Goal: Knowledge of [facility] Education information/materials will improve Outcome: Not Progressing Goal: Emotional status will improve Outcome: Not Progressing Goal: Mental status will improve Outcome: Not Progressing Goal: Verbalization of understanding the information provided will improve Outcome: Not Progressing

## 2024-07-20 NOTE — Progress Notes (Signed)
" °   07/19/24 2303  Psych Admission Type (Psych Patients Only)  Admission Status Involuntary  Psychosocial Assessment  Patient Complaints None  Eye Contact Avoids  Facial Expression Flat  Affect Irritable  Speech Soft  Interaction Guarded  Motor Activity Slow  Appearance/Hygiene In scrubs;Poor hygiene  Behavior Characteristics Cooperative  Mood Pleasant  Thought Process  Coherency WDL  Content WDL  Delusions None reported or observed  Perception WDL  Hallucination None reported or observed  Judgment Poor  Confusion Mild  Danger to Self  Current suicidal ideation? Denies  Agreement Not to Harm Self Yes  Description of Agreement verbal  Danger to Others  Danger to Others None reported or observed     Estimated Sleeping Duration (Last 24 Hours): 9.75-12.25 hours  "

## 2024-07-20 NOTE — Group Note (Signed)
 Date:  07/20/2024 Time:  8:31 PM  Group Topic/Focus:  Wrap-Up Group:   The focus of this group is to help patients review their daily goal of treatment and discuss progress on daily workbooks.    Participation Level:  Did Not Attend  [name] 07/20/2024, 8:31 PM

## 2024-07-20 NOTE — Plan of Care (Signed)
  Problem: Activity: Goal: Sleeping patterns will improve Outcome: Progressing   Problem: Coping: Goal: Ability to demonstrate self-control will improve Outcome: Progressing   

## 2024-07-21 NOTE — Progress Notes (Signed)
 Phone call back [facility] MD Progress Note   9:08 AM [name]  MRN:  [id number]  [name] is a 64 y.o. female admitted: Medicallyfor 05/02/2024 10:56 AM for dehydration, failure to thrive and hypoglycemia. She carries the psychiatric diagnoses of Schizophrenia and has medical hx of hypoglycemia and failure to thrive and has a past medical history of sinus bradycardia, chronic anemia, severe malnutrition and recurrent hypoglycemia.. .    Subjective:  Chart reviewed, case discussed in multidisciplinary meeting, patient seen during rounds.    Per staff report, the patient continues to be irritable and asked staff to leave the room when questioned about psychiatric symptoms; otherwise, she has been mostly stable. During interview, the patient stated that she did not want to speak with this clinical research associate. She was observed responding to internal stimuli while in her room. During the brief interaction, the patient became increasingly irritable and made paranoid statements, including that it is all illegal and that the government is watching. Per staff who have previously cared for the patient, these behaviors and beliefs are reported to be consistent with her baseline.      Past Psychiatric History: see h&P Family History: History reviewed. No pertinent family history. Social History:  Social History   Substance and Sexual Activity  Alcohol Use Not Currently   Comment: Refuses to disclose how much     Social History   Substance and Sexual Activity  Drug Use No   Comment: Refuses to answer    Social History   Socioeconomic History   Marital status: Single    Spouse name: Not on file   Number of children: Not on file   Years of education: Not on file   Highest education level: Not on file  Occupational History   Not on file  Tobacco Use   Smoking status: Every Day    Current packs/day: 2.00    Types: Cigarettes    Passive exposure: Current   Smokeless tobacco: Never  Vaping Use    Vaping status: Never Used  Substance and Sexual Activity   Alcohol use: Not Currently    Comment: Refuses to disclose how much   Drug use: No    Comment: Refuses to answer   Sexual activity: Not Currently    Comment: refused to answer  Other Topics Concern   Not on file  Social History Narrative   Not on file   Social Drivers of Health   Tobacco Use: High Risk (05/06/2024)   Patient History    Smoking Tobacco Use: Every Day    Smokeless Tobacco Use: Never    Passive Exposure: Current  Financial Resource Strain: Not on file  Food Insecurity: Patient Declined (05/06/2024)   Epic    Worried About Programme Researcher, Broadcasting/film/video in the Last Year: Patient declined    Barista in the Last Year: Patient declined  Transportation Needs: Patient Declined (05/06/2024)   Epic    Lack of Transportation (Medical): Patient declined    Lack of Transportation (Non-Medical): Patient declined  Physical Activity: Not on file  Stress: Not on file  Social Connections: Not on file  Depression (PHQ2-9): Not on file  Alcohol Screen: Low Risk (05/06/2024)   Alcohol Screen    Last Alcohol Screening Score (AUDIT): 0  Housing: Unknown (05/06/2024)   Epic    Unable to Pay for Housing in the Last Year: Patient declined    Number of Times Moved in the Last Year: Not on file  Homeless in the Last Year: Not on file  Utilities: Patient Declined (05/06/2024)   Epic    Threatened with loss of utilities: Patient declined  Health Literacy: Not on file   Past Medical History:  Past Medical History:  Diagnosis Date   Anemia 10/02/2021   Non compliance w medication regimen    Schizophrenia [facility])     Past Surgical History:  Procedure Laterality Date   BIOPSY  09/25/2021   Procedure: BIOPSY;  Surgeon: Teressa Toribio SQUIBB, MD;  Location: [facility];  Service: Gastroenterology;;   ESOPHAGOGASTRODUODENOSCOPY (EGD) WITH PROPOFOL  [name] 09/25/2021   Procedure: ESOPHAGOGASTRODUODENOSCOPY (EGD) WITH PROPOFOL ;  Surgeon:  Teressa Toribio SQUIBB, MD;  Location: [facility];  Service: Gastroenterology;  Laterality: [name];  With NGT placement    Current Medications: Current Facility-Administered Medications  Medication Dose Route Frequency Provider Last Rate Last Admin   acetaminophen  (TYLENOL ) tablet 650 mg  650 mg Oral Q6H PRN Starkes-Perry, Takia S, FNP       alum & mag hydroxide-simeth (MAALOX/MYLANTA) 200-200-20 MG/5ML suspension 30 mL  30 mL Oral Q4H PRN Starkes-Perry, Takia S, FNP       haloperidol  (HALDOL ) tablet 5 mg  5 mg Oral BID Jadapalle, Sree, MD   5 mg at 07/20/24 2112   Or   haloperidol  lactate (HALDOL ) injection 10 mg  10 mg Intramuscular BID Jadapalle, Sree, MD       HYDROcodone -acetaminophen  (NORCO/VICODIN) 5-325 MG per tablet 1-2 tablet  1-2 tablet Oral Q4H PRN Starkes-Perry, Takia S, FNP       magnesium  hydroxide (MILK OF MAGNESIA) suspension 30 mL  30 mL Oral Daily PRN Starkes-Perry, Takia S, FNP       multivitamin with minerals tablet 1 tablet  1 tablet Oral Daily Starkes-Perry, Takia S, FNP   1 tablet at 07/20/24 9065   OLANZapine  (ZYPREXA ) injection 5 mg  5 mg Intramuscular TID PRN Wilkie Majel RAMAN, FNP       OLANZapine  (ZYPREXA ) tablet 15 mg  15 mg Oral BID Jadapalle, Sree, MD   15 mg at 07/20/24 2112   OLANZapine  zydis (ZYPREXA ) disintegrating tablet 5 mg  5 mg Oral TID PRN Starkes-Perry, Takia S, FNP       ondansetron  (ZOFRAN ) tablet 4 mg  4 mg Oral Q6H PRN Starkes-Perry, Majel RAMAN, FNP       Or   ondansetron  (ZOFRAN ) injection 4 mg  4 mg Intravenous Q6H PRN Starkes-Perry, Majel RAMAN, FNP       thiamine  (VITAMIN B1) tablet 100 mg  100 mg Oral Daily Starkes-Perry, Takia S, FNP   100 mg at 07/20/24 0934   traZODone  (DESYREL ) tablet 50 mg  50 mg Oral QHS Starkes-Perry, Takia S, FNP   50 mg at 07/20/24 2112    Lab Results:  No results found for this or any previous visit (from the past 48 hours).   Blood Alcohol level:  Lab Results  Component Value Date   [facility] <15 05/02/2024   ETH <15  05/01/2024    Metabolic Disorder Labs: Lab Results  Component Value Date   HGBA1C 5.6 05/14/2024   MPG 114.02 05/14/2024   MPG 116.89 05/02/2024   Lab Results  Component Value Date   PROLACTIN 13.6 04/16/2015   Lab Results  Component Value Date   CHOL 219 (H) 05/14/2024   TRIG 84 05/14/2024   HDL 83 05/14/2024   CHOLHDL 2.6 05/14/2024   VLDL 17 05/14/2024   LDLCALC 119 (H) 05/14/2024   LDLCALC 130 (H) 05/01/2024  Physical Findings: AIMS:  , ,  ,  ,    CIWA:    COWS:      Psychiatric Specialty Exam:  Presentation  General Appearance:  Casual  Eye Contact: Fair  Speech: Normal rate  Speech Volume: Increased    Mood and Affect  Mood: Angry  Affect: Congruent   Thought Process  Thought Processes: Disorganized  Descriptions of Associations:Tangential  Orientation:Partial  Thought Content:Illogical; Tangential  Hallucinations:RESPONDING TO INTERNAL STIMULI Ideas of Reference:Paranoia; Delusions; Percusatory  Suicidal Thoughts:not endorsing Homicidal Thoughts:nor endorsing  Sensorium  Memory: Recent Poor  Judgment: Poor  Insight: Poor   Executive Functions  Concentration: Poor  Attention Span: Poor  Recall: Poor  Fund of Knowledge: Poor  Language: Poor   Psychomotor Activity  Psychomotor Activity:No data recorded Musculoskeletal: Strength & Muscle Tone: within normal limits Gait & Station: normal Assets  Assets: Resilience    Physical Exam: Physical Exam Vitals and nursing note reviewed.    ROS Blood pressure 101/61, pulse (!) 59, temperature (!) 97.3 F (36.3 C), resp. rate 20, height 5' 4 (1.626 m), weight 44.9 kg, SpO2 100%. Body mass index is 16.99 kg/m.  Diagnosis: Active Problems:   Schizophrenia, paranoid (HCC) Paranoid  PLAN: Safety and Monitoring:  -- Involuntary admission to inpatient psychiatric unit for safety, stabilization and treatment  -- Daily contact with patient to assess and  evaluate symptoms and progress in treatment  -- Patient's case to be discussed in multi-disciplinary team meeting  -- Observation Level : q15 minute checks  -- Vital signs:  q12 hours  -- Precautions: suicide, elopement, and assault -- Encouraged patient to participate in unit milieu and in scheduled group therapies  2. Psychiatric Diagnoses and Treatment: Patient  2 physicians approved non emergent forced medications legal guardian gave consent for Haldol  oral or IM  Zyprexa  15 mg BID  Haldol  10 mg bid added - Plan for LAI haldol  Dec 100mg  Q4 weeks DSS gave consent-nonemergent forced medication consented by legal guardian  . Discharge Planning:   -- Social work and case management to assist with discharge planning and identification of hospital follow-up needs prior to discharge  -- Estimated LOS: 3-4 days  Millie JONELLE Manners, MD 07/21/2024, 9:08 AM

## 2024-07-21 NOTE — Group Note (Signed)
 Physical/Occupational Therapy Group Note  Group Topic: Neurographic Art  Group Date: 07/21/2024 Start Time: 1305 End Time: 1355 Facilitators: Clive Warren CROME, OT   Group Description: Group participated with Neurographic art activity, using watercolor paints to facilitate creative expression and meditation/relaxation for each individual.  Incorporated bimanual coordination, mental focus, emotional processing, task/command following and relaxation techniques as appropriate.  Patients engaged socially with therapist and other group participants throughout session. Allowed to ask questions as appropriate, and encouraged to identify ways they could use/share their creations with themselves and others.  Therapeutic Goal(s):  Demonstrate ability to independently manipulate utensils required to participate with and complete activity. Demonstrate ability to cognitively focus on task and follow commands necessary for completion. Demonstrate use of art as an outlet for emotional processing and expression. Identify and demonstrate importance of relaxation, neural calming and meditation for improved participation with life groups.  Individual Participation: Pt did not attend.  Participation Level: Did not attend   Participation Quality:   Behavior:   Speech/Thought Process:   Affect/Mood:   Insight:   Judgement:   Modes of Intervention:   Patient Response to Interventions:    Plan: Continue to engage patient in PT/OT groups 1 - 2x/week.  Nahum Sherrer R., MPH, MS, OTR/L ascom [phone] 07/21/2024, 3:20 PM

## 2024-07-21 NOTE — Plan of Care (Signed)
  Problem: Activity: Goal: Sleeping patterns will improve Outcome: Progressing   

## 2024-07-21 NOTE — Progress Notes (Signed)
" °   07/21/24 1000  Psych Admission Type (Psych Patients Only)  Admission Status Involuntary  Psychosocial Assessment  Patient Complaints None  Eye Contact Avoids  Facial Expression Flat  Affect Irritable  Speech Soft  Interaction Guarded  Motor Activity Slow  Appearance/Hygiene In scrubs  Behavior Characteristics Cooperative  Mood Pleasant  Thought Process  Coherency WDL  Content WDL  Delusions None reported or observed  Perception WDL  Hallucination None reported or observed  Judgment Poor  Confusion Mild  Danger to Self  Current suicidal ideation? Denies  Agreement Not to Harm Self Yes  Description of Agreement verbal  Danger to Others  Danger to Others None reported or observed    "

## 2024-07-21 NOTE — Progress Notes (Signed)
" °   07/20/24 2335  Psych Admission Type (Psych Patients Only)  Admission Status Involuntary  Psychosocial Assessment  Patient Complaints None  Eye Contact Avoids  Facial Expression Flat  Affect Irritable  Speech Soft  Interaction Guarded  Motor Activity Slow  Appearance/Hygiene In scrubs  Behavior Characteristics Cooperative  Mood Pleasant  Thought Process  Coherency WDL  Content WDL  Delusions None reported or observed  Perception WDL  Hallucination None reported or observed  Judgment Poor  Confusion Mild  Danger to Self  Current suicidal ideation? Denies  Agreement Not to Harm Self Yes  Description of Agreement verbal  Danger to Others  Danger to Others None reported or observed    Estimated Sleeping Duration (Last 24 Hours): 10.50-11.75 hours  "

## 2024-07-21 NOTE — Group Note (Signed)
 Date:  07/21/2024 Time:  12:13 PM  Group Topic/Focus:  Managing Feelings:   The focus of this group is to identify what feelings patients have difficulty handling and develop a plan to handle them in a healthier way upon discharge.    Participation Level:  Did Not Attend   [name] BRAVO 07/21/2024, 12:13 PM

## 2024-07-21 NOTE — Group Note (Signed)
 Date:  07/21/2024 Time:  3:38 PM  Group Topic/Focus:  Coping With Mental Health Crisis:   The purpose of this group is to help patients identify strategies for coping with mental health crisis.  Group discusses possible causes of crisis and ways to manage them effectively.    Participation Level:  Did Not Attend    [name] 07/21/2024, 3:38 PM

## 2024-07-21 NOTE — Group Note (Signed)
 Recreation Therapy Group Note   Group Topic:Emotion Expression  Group Date: 07/21/2024 Start Time: 1400 End Time: 1430 Facilitators: Celestia Jeoffrey BRAVO, LRT, CTRS Location: Courtyard  Group Description: Music. Patients are encouraged to name their favorite song(s) for LRT to play song through speaker for group to hear, while in the courtyard getting fresh air and sunlight. Patients educated on the definition of leisure and the importance of having different leisure interests outside of the hospital. Group discussed how leisure activities can often be used as pharmacologist and that listening to music and being outside are examples.    Goal Area(s) Addressed:  Patient will identify a current leisure interest.  Patient will practice making a positive decision. Patient will have the opportunity to try a new leisure activity.   Affect/Mood: [name]   Participation Level: Did not attend    Clinical Observations/Individualized Feedback: Patient did not attend.  Plan: Continue to engage patient in RT group sessions 2-3x/week.   Jeoffrey BRAVO Celestia, LRT, CTRS 07/21/2024 4:48 PM

## 2024-07-22 NOTE — Group Note (Signed)
 LCSW Group Therapy Note  Group Date: 07/22/2024 Start Time: 1032 End Time: 1110   Type of Therapy and Topic:  Group Therapy - Healthy vs Unhealthy Coping Skills  Participation Level:  Did Not Attend   Description of Group The focus of this group was to determine what unhealthy coping techniques typically are used by group members and what healthy coping techniques would be helpful in coping with various problems. Patients were guided in becoming aware of the differences between healthy and unhealthy coping techniques. Patients were asked to identify 2-3 healthy coping skills they would like to learn to use more effectively.  Therapeutic Goals Patients learned that coping is what human beings do all day long to deal with various situations in their lives Patients defined and discussed healthy vs unhealthy coping techniques Patients identified their preferred coping techniques and identified whether these were healthy or unhealthy Patients determined 2-3 healthy coping skills they would like to become more familiar with and use more often. Patients provided support and ideas to each other   Summary of Patient Progress:  Patient did not attend group.  Therapeutic Modalities Cognitive Behavioral Therapy Motivational Interviewing  Hildegard Hlavac W Jaquel Glassburn, LCSWA 07/22/2024  11:45 AM

## 2024-07-22 NOTE — Plan of Care (Signed)
   Problem: Education: Goal: Knowledge of [facility] Education information/materials will improve Outcome: Not Progressing Goal: Emotional status will improve Outcome: Not Progressing Goal: Mental status will improve Outcome: Not Progressing Goal: Verbalization of understanding the information provided will improve Outcome: Not Progressing

## 2024-07-22 NOTE — Plan of Care (Signed)
   Problem: Education: Goal: Knowledge of [facility] Education information/materials will improve Outcome: Progressing Goal: Emotional status will improve Outcome: Progressing Goal: Mental status will improve Outcome: Progressing Goal: Verbalization of understanding the information provided will improve Outcome: Progressing   Problem: Activity: Goal: Interest or engagement in activities will improve Outcome: Progressing Goal: Sleeping patterns will improve Outcome: Progressing   Problem: Coping: Goal: Ability to verbalize frustrations and anger appropriately will improve Outcome: Progressing Goal: Ability to demonstrate self-control will improve Outcome: Progressing   Problem: Health Behavior/Discharge Planning: Goal: Identification of resources available to assist in meeting health care needs will improve Outcome: Progressing Goal: Compliance with treatment plan for underlying cause of condition will improve Outcome: Progressing

## 2024-07-22 NOTE — Progress Notes (Signed)
 Patient accepting medications without issue. Isolated to room except for meals. Unable to assess SI/HI/AVH and observed responding to internal stimuli.   07/22/24 1000  Psych Admission Type (Psych Patients Only)  Admission Status Involuntary  Psychosocial Assessment  Patient Complaints None  Eye Contact Avoids  Facial Expression Flat  Affect Irritable  Speech Soft  Interaction Guarded;Isolative  Motor Activity Slow  Appearance/Hygiene In scrubs  Behavior Characteristics Cooperative  Mood Pleasant  Thought Process  Coherency WDL  Content WDL  Delusions None reported or observed  Perception WDL  Hallucination None reported or observed  Judgment Poor  Confusion Mild  Danger to Self  Current suicidal ideation? Denies  Danger to Others  Danger to Others None reported or observed

## 2024-07-22 NOTE — Group Note (Signed)
 Date:  07/22/2024 Time:  10:32 AM  Group Topic/Focus:  Coping With Mental Health Crisis:   The purpose of this group is to help patients identify strategies for coping with mental health crisis.  Group discusses possible causes of crisis and ways to manage them effectively.    Participation Level:  Did Not Attend    [name] Bias 07/22/2024, 10:32 AM

## 2024-07-23 NOTE — Plan of Care (Signed)
  Problem: Education: Goal: Knowledge of [facility] Education information/materials will improve Outcome: Not Progressing Goal: Emotional status will improve Outcome: Not Progressing Goal: Mental status will improve Outcome: Not Progressing Goal: Verbalization of understanding the information provided will improve Outcome: Not Progressing   Problem: Activity: Goal: Interest or engagement in activities will improve Outcome: Not Progressing Goal: Sleeping patterns will improve Outcome: Not Progressing   Problem: Coping: Goal: Ability to verbalize frustrations and anger appropriately will improve Outcome: Not Progressing Goal: Ability to demonstrate self-control will improve Outcome: Not Progressing   

## 2024-07-23 NOTE — Group Note (Signed)
 Date:  07/23/2024 Time:  8:52 PM  Group Topic/Focus:  Wrap-Up Group:   The focus of this group is to help patients review their daily goal of treatment and discuss progress on daily workbooks.    Participation Level:  Did Not Attend  Participation Quality:     Affect:     Cognitive:    Insight: None  Engagement in Group:    Modes of Intervention:     Additional Comments:    [name] 07/23/2024, 8:52 PM

## 2024-07-23 NOTE — Progress Notes (Signed)
 Phone call back [facility] MD Progress Note   5:59 PM Ryanne Morand  MRN:  [id number]  Tarica Harl is a 64 y.o. female admitted: Medicallyfor 05/02/2024 10:56 AM for dehydration, failure to thrive and hypoglycemia. She carries the psychiatric diagnoses of Schizophrenia and has medical hx of hypoglycemia and failure to thrive and has a past medical history of sinus bradycardia, chronic anemia, severe malnutrition and recurrent hypoglycemia.. .    Subjective:  Chart reviewed, case discussed in multidisciplinary meeting, patient seen during rounds.    Per staff report, the patient continues to be irritable and asked staff to leave the room when questioned about psychiatric symptoms; otherwise, she has been mostly stable. During interview, the patient stated that she did not want to speak with this clinical research associate. She was observed responding to internal stimuli while in her room. During the brief interaction, the patient became increasingly irritable and made paranoid statements, including that it is all illegal and that the government is watching. Per staff who have previously cared for the patient, these behaviors and beliefs are reported to be consistent with her baseline.      Past Psychiatric History: see h&P Family History: History reviewed. No pertinent family history. Social History:  Social History   Substance and Sexual Activity  Alcohol Use Not Currently   Comment: Refuses to disclose how much     Social History   Substance and Sexual Activity  Drug Use No   Comment: Refuses to answer    Social History   Socioeconomic History   Marital status: Single    Spouse name: Not on file   Number of children: Not on file   Years of education: Not on file   Highest education level: Not on file  Occupational History   Not on file  Tobacco Use   Smoking status: Every Day    Current packs/day: 2.00    Types: Cigarettes    Passive exposure: Current   Smokeless tobacco: Never  Vaping Use    Vaping status: Never Used  Substance and Sexual Activity   Alcohol use: Not Currently    Comment: Refuses to disclose how much   Drug use: No    Comment: Refuses to answer   Sexual activity: Not Currently    Comment: refused to answer  Other Topics Concern   Not on file  Social History Narrative   Not on file   Social Drivers of Health   Tobacco Use: High Risk (05/06/2024)   Patient History    Smoking Tobacco Use: Every Day    Smokeless Tobacco Use: Never    Passive Exposure: Current  Financial Resource Strain: Not on file  Food Insecurity: Patient Declined (05/06/2024)   Epic    Worried About Programme Researcher, Broadcasting/film/video in the Last Year: Patient declined    Barista in the Last Year: Patient declined  Transportation Needs: Patient Declined (05/06/2024)   Epic    Lack of Transportation (Medical): Patient declined    Lack of Transportation (Non-Medical): Patient declined  Physical Activity: Not on file  Stress: Not on file  Social Connections: Not on file  Depression (PHQ2-9): Not on file  Alcohol Screen: Low Risk (05/06/2024)   Alcohol Screen    Last Alcohol Screening Score (AUDIT): 0  Housing: Unknown (05/06/2024)   Epic    Unable to Pay for Housing in the Last Year: Patient declined    Number of Times Moved in the Last Year: Not on file  Homeless in the Last Year: Not on file  Utilities: Patient Declined (05/06/2024)   Epic    Threatened with loss of utilities: Patient declined  Health Literacy: Not on file   Past Medical History:  Past Medical History:  Diagnosis Date   Anemia 10/02/2021   Non compliance w medication regimen    Schizophrenia [facility])     Past Surgical History:  Procedure Laterality Date   BIOPSY  09/25/2021   Procedure: BIOPSY;  Surgeon: Teressa Toribio SQUIBB, MD;  Location: [facility];  Service: Gastroenterology;;   ESOPHAGOGASTRODUODENOSCOPY (EGD) WITH PROPOFOL  [name] 09/25/2021   Procedure: ESOPHAGOGASTRODUODENOSCOPY (EGD) WITH PROPOFOL ;  Surgeon:  Teressa Toribio SQUIBB, MD;  Location: [facility];  Service: Gastroenterology;  Laterality: [name];  With NGT placement    Current Medications: Current Facility-Administered Medications  Medication Dose Route Frequency Provider Last Rate Last Admin   acetaminophen  (TYLENOL ) tablet 650 mg  650 mg Oral Q6H PRN Starkes-Perry, Takia S, FNP       alum & mag hydroxide-simeth (MAALOX/MYLANTA) 200-200-20 MG/5ML suspension 30 mL  30 mL Oral Q4H PRN Starkes-Perry, Takia S, FNP       haloperidol  (HALDOL ) tablet 5 mg  5 mg Oral BID Jadapalle, Sree, MD   5 mg at 07/23/24 9081   Or   haloperidol  lactate (HALDOL ) injection 10 mg  10 mg Intramuscular BID Jadapalle, Sree, MD       HYDROcodone -acetaminophen  (NORCO/VICODIN) 5-325 MG per tablet 1-2 tablet  1-2 tablet Oral Q4H PRN Starkes-Perry, Takia S, FNP       magnesium  hydroxide (MILK OF MAGNESIA) suspension 30 mL  30 mL Oral Daily PRN Starkes-Perry, Takia S, FNP       multivitamin with minerals tablet 1 tablet  1 tablet Oral Daily Wilkie Majel RAMAN, FNP   1 tablet at 07/23/24 9081   OLANZapine  (ZYPREXA ) injection 5 mg  5 mg Intramuscular TID PRN Wilkie Majel RAMAN, FNP       OLANZapine  (ZYPREXA ) tablet 15 mg  15 mg Oral BID Jadapalle, Sree, MD   15 mg at 07/23/24 9081   OLANZapine  zydis (ZYPREXA ) disintegrating tablet 5 mg  5 mg Oral TID PRN Starkes-Perry, Takia S, FNP       ondansetron  (ZOFRAN ) tablet 4 mg  4 mg Oral Q6H PRN Starkes-Perry, Majel RAMAN, FNP       Or   ondansetron  (ZOFRAN ) injection 4 mg  4 mg Intravenous Q6H PRN Starkes-Perry, Majel RAMAN, FNP       thiamine  (VITAMIN B1) tablet 100 mg  100 mg Oral Daily Wilkie Majel RAMAN, FNP   100 mg at 07/23/24 9081   traZODone  (DESYREL ) tablet 50 mg  50 mg Oral QHS Starkes-Perry, Takia S, FNP   50 mg at 07/22/24 2118    Lab Results:  No results found for this or any previous visit (from the past 48 hours).   Blood Alcohol level:  Lab Results  Component Value Date   [facility] <15 05/02/2024   ETH <15  05/01/2024    Metabolic Disorder Labs: Lab Results  Component Value Date   HGBA1C 5.6 05/14/2024   MPG 114.02 05/14/2024   MPG 116.89 05/02/2024   Lab Results  Component Value Date   PROLACTIN 13.6 04/16/2015   Lab Results  Component Value Date   CHOL 219 (H) 05/14/2024   TRIG 84 05/14/2024   HDL 83 05/14/2024   CHOLHDL 2.6 05/14/2024   VLDL 17 05/14/2024   LDLCALC 119 (H) 05/14/2024   LDLCALC 130 (H) 05/01/2024  Physical Findings: AIMS:  , ,  ,  ,    CIWA:    COWS:      Psychiatric Specialty Exam:  Presentation  General Appearance:  Casual  Eye Contact: Fair  Speech: Normal rate  Speech Volume: Increased    Mood and Affect  Mood: Angry  Affect: Congruent   Thought Process  Thought Processes: Disorganized  Descriptions of Associations:Tangential  Orientation:Partial  Thought Content:Illogical; Tangential  Hallucinations:RESPONDING TO INTERNAL STIMULI Ideas of Reference:Paranoia; Delusions; Percusatory  Suicidal Thoughts:not endorsing Homicidal Thoughts:nor endorsing  Sensorium  Memory: Recent Poor  Judgment: Poor  Insight: Poor   Executive Functions  Concentration: Poor  Attention Span: Poor  Recall: Poor  Fund of Knowledge: Poor  Language: Poor   Psychomotor Activity  Psychomotor Activity:No data recorded Musculoskeletal: Strength & Muscle Tone: within normal limits Gait & Station: normal Assets  Assets: Resilience    Physical Exam: Physical Exam Vitals and nursing note reviewed.    ROS Blood pressure 138/74, pulse 64, temperature (!) 97.3 F (36.3 C), resp. rate 16, height 5' 4 (1.626 m), weight 44.9 kg, SpO2 100%. Body mass index is 16.99 kg/m.  Diagnosis: Active Problems:   Schizophrenia, paranoid (HCC) Paranoid  PLAN: Safety and Monitoring:  -- Involuntary admission to inpatient psychiatric unit for safety, stabilization and treatment  -- Daily contact with patient to assess and  evaluate symptoms and progress in treatment  -- Patient's case to be discussed in multi-disciplinary team meeting  -- Observation Level : q15 minute checks  -- Vital signs:  q12 hours  -- Precautions: suicide, elopement, and assault -- Encouraged patient to participate in unit milieu and in scheduled group therapies  2. Psychiatric Diagnoses and Treatment: Patient  2 physicians approved non emergent forced medications legal guardian gave consent for Haldol  oral or IM  Zyprexa  15 mg BID  Haldol  10 mg bid added - Plan for LAI haldol  Dec 100mg  Q4 weeks DSS gave consent-nonemergent forced medication consented by legal guardian  . Discharge Planning:   -- Social work and case management to assist with discharge planning and identification of hospital follow-up needs prior to discharge  -- Estimated LOS: 3-4 days  Millie JONELLE Manners, MD 07/23/2024, 5:59 PM

## 2024-07-23 NOTE — Plan of Care (Signed)
  Problem: Activity: Goal: Interest or engagement in activities will improve Outcome: Not Progressing   Problem: Safety: Goal: Periods of time without injury will increase Outcome: Progressing

## 2024-07-23 NOTE — Progress Notes (Signed)
 Patient accepted medications without issue. Short but pleasant during interaction. Denies pain. Denies SI/HI. Unable to assess for AH or VH. Ongoing monitoring continues.   07/23/24 1000  Psych Admission Type (Psych Patients Only)  Admission Status Involuntary  Psychosocial Assessment  Patient Complaints None  Eye Contact Brief  Facial Expression Flat  Affect Flat  Speech Soft  Interaction Isolative  Motor Activity Slow  Appearance/Hygiene In scrubs  Behavior Characteristics Cooperative  Mood Pleasant  Thought Process  Coherency WDL  Content WDL  Delusions None reported or observed  Perception WDL  Hallucination None reported or observed  Judgment Poor  Confusion Mild  Danger to Self  Current suicidal ideation? Denies  Danger to Others  Danger to Others None reported or observed

## 2024-07-23 NOTE — Group Note (Signed)
 [facility] LCSW Group Therapy Note   Group Date: 07/23/2024 Start Time: 1030 End Time: 1115   Type of Therapy/Topic:  Group Therapy:  Emotion Regulation  Participation Level:  Did Not Attend   Mood: Not able to access.  Patient did not attend group.  Description of Group:    The purpose of this group is to assist patients in learning to regulate negative emotions and experience positive emotions. Patients will be guided to discuss ways in which they have been vulnerable to their negative emotions. These vulnerabilities will be juxtaposed with experiences of positive emotions or situations, and patients challenged to use positive emotions to combat negative ones. Special emphasis will be placed on coping with negative emotions in conflict situations, and patients will process healthy conflict resolution skills.  Therapeutic Goals: Patient will identify two positive emotions or experiences to reflect on in order to balance out negative emotions:  Patient will label two or more emotions that they find the most difficult to experience:  Patient will be able to demonstrate positive conflict resolution skills through discussion or role plays:   Summary of Patient Progress:  Patient did not attend group.   Therapeutic Modalities:   Cognitive Behavioral Therapy Feelings Identification Dialectical Behavioral Therapy   Rexene LELON Mae, LCSWA

## 2024-07-23 NOTE — Progress Notes (Signed)
" °   07/22/24 1955  Psych Admission Type (Psych Patients Only)  Admission Status Involuntary  Psychosocial Assessment  Patient Complaints None  Eye Contact Brief  Facial Expression Flat  Affect Sad;Flat  Speech Soft  Interaction Isolative;Minimal  Motor Activity Slow  Appearance/Hygiene In scrubs  Behavior Characteristics Cooperative;Calm  Mood Pleasant  Aggressive Behavior  Effect No apparent injury  Thought Process  Coherency WDL  Content WDL  Delusions None reported or observed  Perception WDL  Hallucination None reported or observed  Judgment Poor  Confusion Mild  Danger to Self  Current suicidal ideation? Denies  Agreement Not to Harm Self Yes  Description of Agreement Verbal  Danger to Others  Danger to Others None reported or observed    "

## 2024-07-23 NOTE — Group Note (Signed)
 Date:  07/23/2024 Time:  2:12 PM  Group Topic/Focus:  Goals Group:   The focus of this group is to help patients establish daily goals to achieve during treatment and discuss how the patient can incorporate goal setting into their daily lives to aide in recovery.    Participation Level:  Did Not Attend  [name] 07/23/2024, 2:12 PM

## 2024-07-23 NOTE — Group Note (Signed)
 Date:  07/23/2024 Time:  3:42 PM  Group Topic/Focus:  Wellness Toolbox:   The focus of this group is to discuss various aspects of wellness, balancing those aspects and exploring ways to increase the ability to experience wellness.  Patients will create a wellness toolbox for use upon discharge.    Participation Level:  Did Not Attend  [name] 07/23/2024, 3:42 PM

## 2024-07-24 NOTE — Group Note (Signed)
 Physical/Occupational Therapy Group Note  Group Topic: Functional, Dynamic Balance   Group Date: 07/24/2024 Start Time: 1300 End Time: 1330 Facilitators: Nyara Capell, Alm Hamilton, PT   Group Description: Group discussed impact of balance on safety and independence with functional tasks.  Identified and discussed any self-perceived balance deficits to personalize information.  Discussed and reviewed strategies to address/improve balance deficits: use of assist devices, activity pacing/energy conservation, environment/home safety modifications, focusing attention/minimizing distraction.  Reviewed and participated with standing LE therex designed to target dynamic balance reactions and LE strength/stability; provided handouts with HEP to be utilized outside of group time as appropriate.  Allowed time for questions and further discussion on any balance or mobility concerns/needs.  Therapeutic Goal(s):  Identify and discuss any individual balance deficits and functional implications. Identify and discuss any environmental/home safety modifications that can optimize balance and safety for mobility within the home. Demonstrate understanding and performance of standing therex designed to target dynamic balance deficits.  Individual Participation: Did not attend   Participation Level:   Participation Quality:   Behavior:   Speech/Thought Process:   Affect/Mood:   Insight:   Judgement:   Modes of Intervention:   Patient Response to Interventions:    Plan: Continue to engage patient in PT/OT groups 1 - 2x/week.  CHARM Hamilton Bertin PT, DPT 07/24/2024, 1:43 PM

## 2024-07-24 NOTE — Plan of Care (Signed)
  Problem: Education: Goal: Knowledge of [place] [facility] Education information/materials will improve Outcome: Progressing Goal: Emotional status will improve Outcome: Progressing Goal: Mental status will improve Outcome: Progressing Goal: Verbalization of understanding the information provided will improve Outcome: Progressing   Problem: Activity: Goal: Interest or engagement in activities will improve Outcome: Progressing Goal: Sleeping patterns will improve Outcome: Progressing   Problem: Coping: Goal: Ability to verbalize frustrations and anger appropriately will improve Outcome: Progressing Goal: Ability to demonstrate self-control will improve Outcome: Progressing   Problem: Health Behavior/Discharge Planning: Goal: Identification of resources available to assist in meeting health care needs will improve Outcome: Progressing Goal: Compliance with treatment plan for underlying cause of condition will improve Outcome: Progressing   Problem: Physical Regulation: Goal: Ability to maintain clinical measurements within normal limits will improve Outcome: Progressing   Problem: Safety: Goal: Periods of time without injury will increase Outcome: Progressing   Problem: Education: Goal: Knowledge of General Education information will improve Description: Including pain rating scale, medication(s)/side effects and non-pharmacologic comfort measures Outcome: Progressing   Problem: Health Behavior/Discharge Planning: Goal: Ability to manage health-related needs will improve Outcome: Progressing   Problem: Clinical Measurements: Goal: Ability to maintain clinical measurements within normal limits will improve Outcome: Progressing Goal: Will remain free from infection Outcome: Progressing Goal: Diagnostic test results will improve Outcome: Progressing Goal: Respiratory complications will improve Outcome: Progressing Goal: Cardiovascular complication will be  avoided Outcome: Progressing   Problem: Activity: Goal: Risk for activity intolerance will decrease Outcome: Progressing   Problem: Nutrition: Goal: Adequate nutrition will be maintained Outcome: Progressing   Problem: Coping: Goal: Level of anxiety will decrease Outcome: Progressing   Problem: Elimination: Goal: Will not experience complications related to bowel motility Outcome: Progressing Goal: Will not experience complications related to urinary retention Outcome: Progressing   Problem: Pain Managment: Goal: General experience of comfort will improve and/or be controlled Outcome: Progressing   Problem: Safety: Goal: Ability to remain free from injury will improve Outcome: Progressing   Problem: Skin Integrity: Goal: Risk for impaired skin integrity will decrease Outcome: Progressing   Problem: Health Behavior/Discharge Planning: Goal: Compliance with prescribed medication regimen will improve Outcome: Progressing   Problem: Nutritional: Goal: Ability to achieve adequate nutritional intake will improve Outcome: Progressing

## 2024-07-24 NOTE — Group Note (Signed)
 Date:  07/24/2024 Time:  11:59 AM  Group Topic/Focus:  Goals Group:   The focus of this group is to help patients establish daily goals to achieve during treatment and discuss how the patient can incorporate goal setting into their daily lives to aide in recovery.    Participation Level:  Did Not Attend   [place] Huberty E 07/24/2024, 11:59 AM

## 2024-07-24 NOTE — Progress Notes (Signed)
" °   07/24/24 0100  Psych Admission Type (Psych Patients Only)  Admission Status Involuntary  Psychosocial Assessment  Patient Complaints None  Eye Contact Brief  Facial Expression Flat  Affect Flat  Speech Soft  Interaction Isolative  Motor Activity Slow  Appearance/Hygiene In scrubs  Behavior Characteristics Cooperative  Mood Pleasant  Aggressive Behavior  Effect No apparent injury  Thought Process  Coherency WDL  Content WDL  Delusions None reported or observed  Perception WDL  Hallucination None reported or observed  Judgment WDL  Confusion Mild  Danger to Self  Current suicidal ideation? Denies  Danger to Others  Danger to Others None reported or observed    "

## 2024-07-24 NOTE — BH IP Treatment Plan (Signed)
 Interdisciplinary Treatment and Diagnostic Plan Update  07/24/2024 Time of Session: 2:30 PM  [name] MRN: [id number]  Principal Diagnosis: <principal problem not specified>  Secondary Diagnoses: Active Problems:   Schizophrenia, paranoid (HCC)   Current Medications:  Current Facility-Administered Medications  Medication Dose Route Frequency Provider Last Rate Last Admin   acetaminophen  (TYLENOL ) tablet 650 mg  650 mg Oral Q6H PRN Starkes-Perry, Takia S, FNP       alum & mag hydroxide-simeth (MAALOX/MYLANTA) 200-200-20 MG/5ML suspension 30 mL  30 mL Oral Q4H PRN Starkes-Perry, Takia S, FNP       haloperidol  (HALDOL ) tablet 5 mg  5 mg Oral BID Jadapalle, Sree, MD   5 mg at 07/24/24 9071   Or   haloperidol  lactate (HALDOL ) injection 10 mg  10 mg Intramuscular BID Jadapalle, Sree, MD       HYDROcodone -acetaminophen  (NORCO/VICODIN) 5-325 MG per tablet 1-2 tablet  1-2 tablet Oral Q4H PRN Starkes-Perry, Takia S, FNP       magnesium  hydroxide (MILK OF MAGNESIA) suspension 30 mL  30 mL Oral Daily PRN Starkes-Perry, Takia S, FNP       multivitamin with minerals tablet 1 tablet  1 tablet Oral Daily Wilkie Majel RAMAN, FNP   1 tablet at 07/24/24 9071   OLANZapine  (ZYPREXA ) injection 5 mg  5 mg Intramuscular TID PRN Wilkie Majel RAMAN, FNP       OLANZapine  (ZYPREXA ) tablet 15 mg  15 mg Oral BID Jadapalle, Sree, MD   15 mg at 07/24/24 9072   OLANZapine  zydis (ZYPREXA ) disintegrating tablet 5 mg  5 mg Oral TID PRN Wilkie Majel RAMAN, FNP       ondansetron  (ZOFRAN ) tablet 4 mg  4 mg Oral Q6H PRN Wilkie Majel RAMAN, FNP       Or   ondansetron  (ZOFRAN ) injection 4 mg  4 mg Intravenous Q6H PRN Starkes-Perry, Majel RAMAN, FNP       thiamine  (VITAMIN B1) tablet 100 mg  100 mg Oral Daily Wilkie Majel RAMAN, FNP   100 mg at 07/24/24 9072   traZODone  (DESYREL ) tablet 50 mg  50 mg Oral QHS Starkes-Perry, Takia S, FNP   50 mg at 07/23/24 2122   PTA Medications: Medications Prior to Admission   Medication Sig Dispense Refill Last Dose/Taking   feeding supplement (ENSURE PLUS HIGH PROTEIN) LIQD Take 237 mLs by mouth 3 (three) times daily between meals.      Multiple Vitamin (MULTIVITAMIN WITH MINERALS) TABS tablet Take 1 tablet by mouth daily.      OLANZapine  (ZYPREXA ) 5 MG tablet Take 1 tablet (5 mg total) by mouth 2 (two) times daily.      OLANZapine  (ZYPREXA ) injection Inject 1 mL (5 mg total) into the muscle 2 (two) times daily.      thiamine  (VITAMIN B-1) 100 MG tablet Take 1 tablet (100 mg total) by mouth daily.      traZODone  (DESYREL ) 50 MG tablet Take 1 tablet (50 mg total) by mouth at bedtime.       Patient Stressors:    Patient Strengths:    Treatment Modalities: Medication Management, Group therapy, Case management,  1 to 1 session with clinician, Psychoeducation, Recreational therapy.   Physician Treatment Plan for Primary Diagnosis: <principal problem not specified> Long Term Goal(s): Improvement in symptoms so as ready for discharge   Short Term Goals: Ability to identify changes in lifestyle to reduce recurrence of condition will improve  Medication Management: Evaluate patient's response, side effects, and tolerance of medication regimen.  Therapeutic Interventions: 1 to 1 sessions, Unit Group sessions and Medication administration.  Evaluation of Outcomes: Progressing  Physician Treatment Plan for Secondary Diagnosis: Active Problems:   Schizophrenia, paranoid (HCC)  Long Term Goal(s): Improvement in symptoms so as ready for discharge   Short Term Goals: Ability to identify changes in lifestyle to reduce recurrence of condition will improve     Medication Management: Evaluate patient's response, side effects, and tolerance of medication regimen.  Therapeutic Interventions: 1 to 1 sessions, Unit Group sessions and Medication administration.  Evaluation of Outcomes: Progressing   RN Treatment Plan for Primary Diagnosis: <principal problem not  specified> Long Term Goal(s): Knowledge of disease and therapeutic regimen to maintain health will improve  Short Term Goals: Ability to remain free from injury will improve, Ability to verbalize frustration and anger appropriately will improve, Ability to demonstrate self-control, Ability to participate in decision making will improve, Ability to verbalize feelings will improve, Ability to disclose and discuss suicidal ideas, Ability to identify and develop effective coping behaviors will improve, and Compliance with prescribed medications will improve  Medication Management: RN will administer medications as ordered by provider, will assess and evaluate patient's response and provide education to patient for prescribed medication. RN will report any adverse and/or side effects to prescribing provider.  Therapeutic Interventions: 1 on 1 counseling sessions, Psychoeducation, Medication administration, Evaluate responses to treatment, Monitor vital signs and CBGs as ordered, Perform/monitor CIWA, COWS, AIMS and Fall Risk screenings as ordered, Perform wound care treatments as ordered.  Evaluation of Outcomes: Progressing   LCSW Treatment Plan for Primary Diagnosis: <principal problem not specified> Long Term Goal(s): Safe transition to appropriate next level of care at discharge, Engage patient in therapeutic group addressing interpersonal concerns.  Short Term Goals: Engage patient in aftercare planning with referrals and resources, Increase social support, Increase ability to appropriately verbalize feelings, Increase emotional regulation, Facilitate acceptance of mental health diagnosis and concerns, Facilitate patient progression through stages of change regarding substance use diagnoses and concerns, Identify triggers associated with mental health/substance abuse issues, and Increase skills for wellness and recovery  Therapeutic Interventions: Assess for all discharge needs, 1 to 1 time with  Social worker, Explore available resources and support systems, Assess for adequacy in community support network, Educate family and significant other(s) on suicide prevention, Complete Psychosocial Assessment, Interpersonal group therapy.  Evaluation of Outcomes: Progressing   Progress in Treatment: Attending groups: No. Participating in groups: No. Taking medication as prescribed: Yes. Toleration medication: Yes. Family/Significant other contact made: No, will contact:  contact has been made with DSS as well as patients brother. Patient understands diagnosis: No. Discussing patient identified problems/goals with staff: No. Medical problems stabilized or resolved: Yes. Denies suicidal/homicidal ideation: Yes. Issues/concerns per patient self-inventory: No. Other: none   New problem(s) identified: None identified. Update: 05/13/2024 No changes at this time. Update 05/18/2024:  No changes at this time. Update 05/23/2024:  No changes at this time. Update 05/28/2024:  No changes at this time. Update 06/02/2024:  No changes at this time. Update 06/07/2024:  No changes at this time. 06/12/24 Update: Patient has been isolative to her room with minimal interaction. Update 06/18/24:  No changes at this time  Update 06/24/24:  No changes at this time Update 06/29/24:  No changes at this time 07/04/24 Update: No changes. 07/09/24 07/14/2024: No changes at this time.  Update 07/19/24: No changes at this time Update 07/25/23: No changes at this time    New Short Term/Long Term Goal(s): elimination of  symptoms of psychosis, medication management for mood stabilization; elimination of SI thoughts; development of comprehensive mental wellness plan. Update: 05/13/2024 No changes at this time.Update: 05/18/2024 No changes at this time. Update 05/23/2024:  No changes at this time. Update 05/28/2024:  No changes at this time. Update 06/02/2024:  No changes at this time. Update 06/07/2024:  No changes at this time.  06/12/24 Update: No changes at this time. Update 06/18/24:  No changes at this time  Update 06/24/24:  No changes at this time Update 06/29/24:  No changes at this time 07/04/24 Update: No changes. 07/09/24: No change 07/14/2024: No changes at this time.  Update 07/19/24: No changes at this time Update 07/25/23: No changes at this time    Patient Goals:   I just need to leave, I don't need it hereUpdate: 05/13/2024 No changes at this time.Update: 05/18/2024 No changes at this time. Update 05/23/2024:  No changes at this time.Update 05/28/2024:  No changes at this time. Update 06/02/2024:  No changes at this time. Update 06/07/2024:  No changes at this time. 06/12/24 Update: No changes at this time. Update 06/18/24:  No changes at this time Update 06/24/24:  No changes at this time Update 06/29/24:  No changes at this time 07/04/24 Update: No changes. 07/09/24: No changes at this time.  07/14/2024: No changes at this time.   Update 07/19/24: No changes at this time Update 07/25/23: No changes at this time    Discharge Plan or Barriers: CSW will assist with appropriate discharge planning. Update: 05/13/2024 No changes at this time.Update: 05/18/2024 No changes at this time. Update 05/23/2024:  No changes at this time.Update 05/28/2024:  CSW unable to identify appropriate decision maker, as pt's legal guardian has passed and CSW has reached out to APS caseworker multiple times to get clarity on protocol for enforcing medication until an appropriate party has been legal identified Update 06/02/2024:  No changes at this time. Update 06/07/2024:  CSW and provider still working to clarify appropriate horticulturist, commercial. Please refer to extensive notes in medical record. 06/12/24 Update: CSW and provider still working with DSS to obtain legal decision maker on patient's behalf. Update 06/18/24:  No changes at this time Update 06/24/24:  DSS received ex-parte, DSS to begin looking for placement, Dr.J to initiate  enforced medication Update 06/29/24:  No changes at this time 07/04/24 Update: CSW to continue to provide support to APS case worker while DSS works on clinical cytogeneticist. No changes. 07/09/24 07/14/2024: Possible bed at [facility], CSW team to follow up with guardian regarding this.   Update 07/19/24: No changes at this time  Update 07/25/23: No changes at this time    Reason for Continuation of Hospitalization: Aggression Hallucinations   Estimated Length of Stay:1 to 7 days. Update: 05/13/2024 TBDUpdate: 05/18/2024 TBD Update 05/23/2024:  TBD Update 05/28/2024:  TBDUpdate 06/02/2024:  TBD Update 06/07/2024:  TBD 06/12/24 Update: TBD. Update 06/18/24:  TBD Update 06/24/24:  TBD Update 06/29/24:  TBD 07/04/24 Update: TBD.Update,  07/09/24 07/14/2024: TBD  Update 07/19/24: TBD Update 07/25/23: TBD  Last 3 [place] Suicide Severity Risk Score: Flowsheet Row Admission (Current) from 05/06/2024 in [facility] to Hosp-Admission (Discharged) from 05/02/2024 in [facility] from 04/30/2024 in [facility]  C-SSRS RISK CATEGORY No Risk No Risk No Risk    Last [facility] 2/9 Scores:     No data to display  Scribe for Treatment Team: [name], LCSWA 07/24/2024 4:04 PM

## 2024-07-24 NOTE — Group Note (Signed)
 Recreation Therapy Group Note   Group Topic:Stress Management  Group Date: 07/24/2024 Start Time: 1415 End Time: 1440 Facilitators: Celestia Jeoffrey BRAVO, LRT, CTRS Location: Dayroom  Group Description: Meditation. LRT asks patients their current level of stress/anxiety from 1-10, with 10 being the highest. LRT educated on the benefits of meditation and relaxation techniques, and how it can apply to everyday life post-discharge. LRT and pt's followed along to an audio script of a guided meditation video. LRT asked pt their level of stress and anxiety once the prompt was finished. LRT facilitated post-activity processing to gain feedback on session.   Goal Area(s) Addressed:  Patient will practice using relaxation technique. Patient will identify a new coping skill.  Patient will follow multistep directions to reduce anxiety and stress.   Affect/Mood: [name]   Participation Level: Did not attend    Clinical Observations/Individualized Feedback: Patient did not attend.  Plan: Continue to engage patient in RT group sessions 2-3x/week.   Jeoffrey BRAVO Celestia, LRT, CTRS 07/24/2024 4:35 PM

## 2024-07-24 NOTE — Progress Notes (Signed)
 Phone call back [facility] MD Progress Note   5:12 PM [name]  MRN:  [id number]  [name] is a 64 y.o. female admitted: Medicallyfor 05/02/2024 10:56 AM for dehydration, failure to thrive and hypoglycemia. She carries the psychiatric diagnoses of Schizophrenia and has medical hx of hypoglycemia and failure to thrive and has a past medical history of sinus bradycardia, chronic anemia, severe malnutrition and recurrent hypoglycemia.. .    Subjective:  Chart reviewed, case discussed in multidisciplinary meeting, patient seen during rounds.    Patient is noted to be standing in her room.  She remains discharge focused on monitor now when she will be leaving the hospital patient is unable to engage in any discussions regarding her mental health, further treatment and transition of care.  She continues to be hostile when provider tried to explain the legal guardianship process and she continues to claim that she is her own guardian.  She denies SI/HI/plan but remains displaying paranoid delusions.      Past Psychiatric History: see h&P Family History: History reviewed. No pertinent family history. Social History:  Social History   Substance and Sexual Activity  Alcohol Use Not Currently   Comment: Refuses to disclose how much     Social History   Substance and Sexual Activity  Drug Use No   Comment: Refuses to answer    Social History   Socioeconomic History   Marital status: Single    Spouse name: Not on file   Number of children: Not on file   Years of education: Not on file   Highest education level: Not on file  Occupational History   Not on file  Tobacco Use   Smoking status: Every Day    Current packs/day: 2.00    Types: Cigarettes    Passive exposure: Current   Smokeless tobacco: Never  Vaping Use   Vaping status: Never Used  Substance and Sexual Activity   Alcohol use: Not Currently    Comment: Refuses to disclose how much   Drug use: No    Comment: Refuses to  answer   Sexual activity: Not Currently    Comment: refused to answer  Other Topics Concern   Not on file  Social History Narrative   Not on file   Social Drivers of Health   Tobacco Use: High Risk (05/06/2024)   Patient History    Smoking Tobacco Use: Every Day    Smokeless Tobacco Use: Never    Passive Exposure: Current  Financial Resource Strain: Not on file  Food Insecurity: Patient Declined (05/06/2024)   Epic    Worried About Programme Researcher, Broadcasting/film/video in the Last Year: Patient declined    Barista in the Last Year: Patient declined  Transportation Needs: Patient Declined (05/06/2024)   Epic    Lack of Transportation (Medical): Patient declined    Lack of Transportation (Non-Medical): Patient declined  Physical Activity: Not on file  Stress: Not on file  Social Connections: Not on file  Depression (PHQ2-9): Not on file  Alcohol Screen: Low Risk (05/06/2024)   Alcohol Screen    Last Alcohol Screening Score (AUDIT): 0  Housing: Unknown (05/06/2024)   Epic    Unable to Pay for Housing in the Last Year: Patient declined    Number of Times Moved in the Last Year: Not on file    Homeless in the Last Year: Not on file  Utilities: Patient Declined (05/06/2024)   Epic    Threatened with loss  of utilities: Patient declined  Health Literacy: Not on file   Past Medical History:  Past Medical History:  Diagnosis Date   Anemia 10/02/2021   Non compliance w medication regimen    Schizophrenia [facility])     Past Surgical History:  Procedure Laterality Date   BIOPSY  09/25/2021   Procedure: BIOPSY;  Surgeon: Teressa Toribio SQUIBB, MD;  Location: [facility];  Service: Gastroenterology;;   ESOPHAGOGASTRODUODENOSCOPY (EGD) WITH PROPOFOL  [name] 09/25/2021   Procedure: ESOPHAGOGASTRODUODENOSCOPY (EGD) WITH PROPOFOL ;  Surgeon: Teressa Toribio SQUIBB, MD;  Location: [facility];  Service: Gastroenterology;  Laterality: [name];  With NGT placement    Current Medications: Current Facility-Administered  Medications  Medication Dose Route Frequency Provider Last Rate Last Admin   acetaminophen  (TYLENOL ) tablet 650 mg  650 mg Oral Q6H PRN Starkes-Perry, Takia S, FNP       alum & mag hydroxide-simeth (MAALOX/MYLANTA) 200-200-20 MG/5ML suspension 30 mL  30 mL Oral Q4H PRN Starkes-Perry, Takia S, FNP       haloperidol  (HALDOL ) tablet 5 mg  5 mg Oral BID Marshall Roehrich, MD   5 mg at 07/24/24 9071   Or   haloperidol  lactate (HALDOL ) injection 10 mg  10 mg Intramuscular BID Kaegan Hettich, MD       HYDROcodone -acetaminophen  (NORCO/VICODIN) 5-325 MG per tablet 1-2 tablet  1-2 tablet Oral Q4H PRN Starkes-Perry, Takia S, FNP       magnesium  hydroxide (MILK OF MAGNESIA) suspension 30 mL  30 mL Oral Daily PRN Starkes-Perry, Takia S, FNP       multivitamin with minerals tablet 1 tablet  1 tablet Oral Daily Wilkie Majel RAMAN, FNP   1 tablet at 07/24/24 9071   OLANZapine  (ZYPREXA ) injection 5 mg  5 mg Intramuscular TID PRN Wilkie Majel RAMAN, FNP       OLANZapine  (ZYPREXA ) tablet 15 mg  15 mg Oral BID Dacota Devall, MD   15 mg at 07/24/24 9072   OLANZapine  zydis (ZYPREXA ) disintegrating tablet 5 mg  5 mg Oral TID PRN Starkes-Perry, Takia S, FNP       ondansetron  (ZOFRAN ) tablet 4 mg  4 mg Oral Q6H PRN Starkes-Perry, Majel RAMAN, FNP       Or   ondansetron  (ZOFRAN ) injection 4 mg  4 mg Intravenous Q6H PRN Starkes-Perry, Majel RAMAN, FNP       thiamine  (VITAMIN B1) tablet 100 mg  100 mg Oral Daily Wilkie Majel RAMAN, FNP   100 mg at 07/24/24 9072   traZODone  (DESYREL ) tablet 50 mg  50 mg Oral QHS Starkes-Perry, Takia S, FNP   50 mg at 07/23/24 2122    Lab Results:  No results found for this or any previous visit (from the past 48 hours).   Blood Alcohol level:  Lab Results  Component Value Date   [facility] <15 05/02/2024   ETH <15 05/01/2024    Metabolic Disorder Labs: Lab Results  Component Value Date   HGBA1C 5.6 05/14/2024   MPG 114.02 05/14/2024   MPG 116.89 05/02/2024   Lab Results   Component Value Date   PROLACTIN 13.6 04/16/2015   Lab Results  Component Value Date   CHOL 219 (H) 05/14/2024   TRIG 84 05/14/2024   HDL 83 05/14/2024   CHOLHDL 2.6 05/14/2024   VLDL 17 05/14/2024   LDLCALC 119 (H) 05/14/2024   LDLCALC 130 (H) 05/01/2024    Physical Findings: AIMS:  , ,  ,  ,    CIWA:    COWS:  Psychiatric Specialty Exam:  Presentation  General Appearance:  Casual  Eye Contact: Fair  Speech: Normal rate  Speech Volume: Increased    Mood and Affect  Mood: Angry  Affect: Congruent   Thought Process  Thought Processes: Disorganized  Descriptions of Associations:Tangential  Orientation:Partial  Thought Content:Illogical; Tangential  Hallucinations:RESPONDING TO INTERNAL STIMULI Ideas of Reference:Paranoia; Delusions; Percusatory  Suicidal Thoughts:not endorsing Homicidal Thoughts:nor endorsing  Sensorium  Memory: Recent Poor  Judgment: Poor  Insight: Poor   Executive Functions  Concentration: Poor  Attention Span: Poor  Recall: Poor  Fund of Knowledge: Poor  Language: Poor   Psychomotor Activity  Psychomotor Activity:No data recorded Musculoskeletal: Strength & Muscle Tone: within normal limits Gait & Station: normal Assets  Assets: Resilience    Physical Exam: Physical Exam Vitals and nursing note reviewed.    ROS Blood pressure (!) 110/57, pulse (!) 59, temperature 97.6 F (36.4 C), resp. rate 15, height 5' 4 (1.626 m), weight 44.9 kg, SpO2 100%. Body mass index is 16.99 kg/m.  Diagnosis: Active Problems:   Schizophrenia, paranoid (HCC) Paranoid  PLAN: Safety and Monitoring:  -- Involuntary admission to inpatient psychiatric unit for safety, stabilization and treatment  -- Daily contact with patient to assess and evaluate symptoms and progress in treatment  -- Patient's case to be discussed in multi-disciplinary team meeting  -- Observation Level : q15 minute checks  -- Vital  signs:  q12 hours  -- Precautions: suicide, elopement, and assault -- Encouraged patient to participate in unit milieu and in scheduled group therapies  2. Psychiatric Diagnoses and Treatment: Patient  2 physicians approved non emergent forced medications legal guardian gave consent for Haldol  oral or IM  Zyprexa  15 mg BID  Haldol  10 mg bid added - Plan for LAI haldol  Dec 100mg  Q4 weeks DSS gave consent-nonemergent forced medication consented by legal guardian  . Discharge Planning:   -- Social work and case management to assist with discharge planning and identification of hospital follow-up needs prior to discharge  -- Estimated LOS: 3-4 days  Marybeth Dandy, MD 07/24/2024, 5:12 PM

## 2024-07-24 NOTE — Group Note (Signed)
 Date:  07/24/2024 Time:  8:51 PM  Group Topic/Focus:  Wrap-Up Group:   The focus of this group is to help patients review their daily goal of treatment and discuss progress on daily workbooks.    Participation Level:  Did Not Attend  Participation Quality:     Affect:     Cognitive:     Insight: None  Engagement in Group:  None  Modes of Intervention:     Additional Comments:    [name] 07/24/2024, 8:51 PM

## 2024-07-25 NOTE — Progress Notes (Signed)
" °   07/25/24 1200  Psych Admission Type (Psych Patients Only)  Admission Status Involuntary  Psychosocial Assessment  Patient Complaints None  Eye Contact Brief  Facial Expression Flat  Affect Flat  Speech Soft  Interaction Isolative  Motor Activity Slow  Appearance/Hygiene In scrubs  Behavior Characteristics Cooperative  Mood Pleasant  Thought Process  Coherency WDL  Content WDL  Delusions None reported or observed  Perception WDL  Hallucination None reported or observed  Judgment WDL  Confusion Mild  Danger to Self  Current suicidal ideation? Denies  Agreement Not to Harm Self Yes  Description of Agreement verbal  Danger to Others  Danger to Others None reported or observed    "

## 2024-07-25 NOTE — Group Note (Signed)
 Recreation Therapy Group Note   Group Topic:Animal Assisted Therapy   Group Date: 07/25/2024 Start Time: 1020 End Time: 1045 Facilitators: Celestia Jeoffrey BRAVO, LRT, CTRS Location: Dayroom  Group Description: AAA. Animal-Assisted Activity provides opportunities for motivational, educational, therapeutic and/or recreational benefits to enhance quality of life. Selinda and Rollo visited the unit to interact with patients.   Goal Areas Addressed:  Reduced anxiety and stress Improved mood Increased social interaction Enhanced communication skills Reduced loneliness and isolation Improved emotional regulation   Affect/Mood: [name]   Participation Level: Did not attend    Clinical Observations/Individualized Feedback: Patient did not attend.  Plan: Continue to engage patient in RT group sessions 2-3x/week.   Jeoffrey BRAVO Celestia, LRT, CTRS 07/25/2024 11:53 AM

## 2024-07-25 NOTE — B[facility] Counselor (Signed)
 CSW reached out to Etha Chang, APS caseworker, via email to get updates on placement efforts for pt.   CSW also included provider on email, should provider have any clinical updates to give or should Nichole have any clinical questions for provider.   CSW awaits response at this time regarding placement updates.    [name] Croft, MSW, [place] 07/25/2024 9:03 AM

## 2024-07-25 NOTE — Progress Notes (Incomplete)
(  Sleep Hours) - (Any PRNs that were needed, meds refused, or side effects to meds)- None needed or requested (Any disturbances and when (visitation, over night)-None reported or observed (Concerns raised by the patient)- None reported or observed (SI/HI/AVH)- Denies

## 2024-07-25 NOTE — Group Note (Signed)
 Date:  07/25/2024 Time:  3:58 PM  Group Topic/Focus:  Making Healthy Choices:   The focus of this group is to help patients identify negative/unhealthy choices they were using prior to admission and identify positive/healthier coping strategies to replace them upon discharge.    Participation Level:  Did Not Attend   Harlene LITTIE Gavel 07/25/2024, 3:58 PM

## 2024-07-25 NOTE — Group Note (Signed)
 Date:  07/25/2024 Time:  9:01 PM  Group Topic/Focus:  Wrap-Up Group:   The focus of this group is to help patients review their daily goal of treatment and discuss progress on daily workbooks.    Participation Level:  Did Not Attend  Participation Quality:     Affect:     Cognitive:     Insight: None  Engagement in Group:    Modes of Intervention:     Additional Comments:    [name] Getting 07/25/2024, 9:01 PM

## 2024-07-25 NOTE — Progress Notes (Signed)
 Patient took medications without issues. Remained isolated in room but was pleasant and cooperative during interaction. Denies anxiety, depression, SI/HI/AVH. Slept for about 8 hrs.   07/24/24 2200  Psych Admission Type (Psych Patients Only)  Admission Status Involuntary  Psychosocial Assessment  Patient Complaints None  Eye Contact Brief  Facial Expression Flat  Affect Flat  Speech Soft  Interaction Isolative  Motor Activity Slow  Appearance/Hygiene In scrubs  Behavior Characteristics Cooperative  Mood Pleasant  Thought Process  Coherency WDL  Content WDL  Delusions None reported or observed  Perception WDL  Hallucination None reported or observed  Judgment WDL  Confusion Mild  Danger to Self  Current suicidal ideation? Denies  Agreement Not to Harm Self Yes  Description of Agreement verbal  Danger to Others  Danger to Others None reported or observed

## 2024-07-25 NOTE — Group Note (Signed)
 LCSW Group Therapy Note  Group Date: 07/25/2024 Start Time: 1330 End Time: 1400   Type of Therapy and Topic:  Group Therapy - Healthy vs Unhealthy Coping Skills  Participation Level:  Did Not Attend   Description of Group The focus of this group was to determine what unhealthy coping techniques typically are used by group members and what healthy coping techniques would be helpful in coping with various problems. Patients were guided in becoming aware of the differences between healthy and unhealthy coping techniques. Patients were asked to identify 2-3 healthy coping skills they would like to learn to use more effectively.  Therapeutic Goals Patients learned that coping is what human beings do all day long to deal with various situations in their lives Patients defined and discussed healthy vs unhealthy coping techniques Patients identified their preferred coping techniques and identified whether these were healthy or unhealthy Patients determined 2-3 healthy coping skills they would like to become more familiar with and use more often. Patients provided support and ideas to each other   Summary of Patient Progress:  X   Therapeutic Modalities Cognitive Behavioral Therapy Motivational Interviewing  [name], [place] 07/25/2024  2:19 PM

## 2024-07-25 NOTE — Plan of Care (Signed)
  Problem: Education: Goal: Mental status will improve Outcome: Progressing   Problem: Activity: Goal: Interest or engagement in activities will improve Outcome: Progressing   Problem: Coping: Goal: Ability to verbalize frustrations and anger appropriately will improve Outcome: Progressing Goal: Ability to demonstrate self-control will improve Outcome: Progressing   Problem: Safety: Goal: Periods of time without injury will increase Outcome: Progressing

## 2024-07-25 NOTE — Progress Notes (Signed)
 Phone call back [facility] MD Progress Note   8:06 PM Takhia Spoon  MRN:  [id number]  Diane Hanel is a 64 y.o. female admitted: [facility] 05/02/2024 10:56 AM for dehydration, failure to thrive and hypoglycemia. She carries the psychiatric diagnoses of Schizophrenia and has medical hx of hypoglycemia and failure to thrive and has a past medical history of sinus bradycardia, chronic anemia, severe malnutrition and recurrent hypoglycemia.. .    Subjective:  Chart reviewed, case discussed in multidisciplinary meeting, patient seen during rounds.    Patient is noted to be standing in her room.  She continues to remain discharge focused.  Today she complains having lower backache due to hard mattress and when provider offered her some pain management she declined saying she just needs to go home.  She is not displaying any self-harming behaviors and is not responding to any internal stimuli.   Past Psychiatric History: see h&P Family History: History reviewed. No pertinent family history. Social History:  Social History   Substance and Sexual Activity  Alcohol Use Not Currently   Comment: Refuses to disclose how much     Social History   Substance and Sexual Activity  Drug Use No   Comment: Refuses to answer    Social History   Socioeconomic History   Marital status: Single    Spouse name: Not on file   Number of children: Not on file   Years of education: Not on file   Highest education level: Not on file  Occupational History   Not on file  Tobacco Use   Smoking status: Every Day    Current packs/day: 2.00    Types: Cigarettes    Passive exposure: Current   Smokeless tobacco: Never  Vaping Use   Vaping status: Never Used  Substance and Sexual Activity   Alcohol use: Not Currently    Comment: Refuses to disclose how much   Drug use: No    Comment: Refuses to answer   Sexual activity: Not Currently    Comment: refused to answer  Other Topics Concern   Not on file  Social  History Narrative   Not on file   Social Drivers of Health   Tobacco Use: High Risk (05/06/2024)   Patient History    Smoking Tobacco Use: Every Day    Smokeless Tobacco Use: Never    Passive Exposure: Current  Financial Resource Strain: Not on file  Food Insecurity: Patient Declined (05/06/2024)   Epic    Worried About Programme Researcher, Broadcasting/film/video in the Last Year: Patient declined    Barista in the Last Year: Patient declined  Transportation Needs: Patient Declined (05/06/2024)   Epic    Lack of Transportation (Medical): Patient declined    Lack of Transportation (Non-Medical): Patient declined  Physical Activity: Not on file  Stress: Not on file  Social Connections: Not on file  Depression (PHQ2-9): Not on file  Alcohol Screen: Low Risk (05/06/2024)   Alcohol Screen    Last Alcohol Screening Score (AUDIT): 0  Housing: Unknown (05/06/2024)   Epic    Unable to Pay for Housing in the Last Year: Patient declined    Number of Times Moved in the Last Year: Not on file    Homeless in the Last Year: Not on file  Utilities: Patient Declined (05/06/2024)   Epic    Threatened with loss of utilities: Patient declined  Health Literacy: Not on file   Past Medical History:  Past Medical History:  Diagnosis Date   Anemia 10/02/2021   Non compliance w medication regimen    Schizophrenia [facility])     Past Surgical History:  Procedure Laterality Date   BIOPSY  09/25/2021   Procedure: BIOPSY;  Surgeon: Teressa Toribio SQUIBB, MD;  Location: [facility];  Service: Gastroenterology;;   ESOPHAGOGASTRODUODENOSCOPY (EGD) WITH PROPOFOL  [name] 09/25/2021   Procedure: ESOPHAGOGASTRODUODENOSCOPY (EGD) WITH PROPOFOL ;  Surgeon: Teressa Toribio SQUIBB, MD;  Location: [facility];  Service: Gastroenterology;  Laterality: [name];  With NGT placement    Current Medications: Current Facility-Administered Medications  Medication Dose Route Frequency Provider Last Rate Last Admin   acetaminophen  (TYLENOL ) tablet 650 mg   650 mg Oral Q6H PRN Starkes-Perry, Takia S, FNP       alum & mag hydroxide-simeth (MAALOX/MYLANTA) 200-200-20 MG/5ML suspension 30 mL  30 mL Oral Q4H PRN Starkes-Perry, Takia S, FNP       haloperidol  (HALDOL ) tablet 5 mg  5 mg Oral BID Unknown Schleyer, MD   5 mg at 07/25/24 9148   Or   haloperidol  lactate (HALDOL ) injection 10 mg  10 mg Intramuscular BID Evella Kasal, MD       HYDROcodone -acetaminophen  (NORCO/VICODIN) 5-325 MG per tablet 1-2 tablet  1-2 tablet Oral Q4H PRN Starkes-Perry, Takia S, FNP       magnesium  hydroxide (MILK OF MAGNESIA) suspension 30 mL  30 mL Oral Daily PRN Starkes-Perry, Takia S, FNP       multivitamin with minerals tablet 1 tablet  1 tablet Oral Daily Wilkie Majel RAMAN, FNP   1 tablet at 07/25/24 0850   OLANZapine  (ZYPREXA ) injection 5 mg  5 mg Intramuscular TID PRN Wilkie Majel RAMAN, FNP       OLANZapine  (ZYPREXA ) tablet 15 mg  15 mg Oral BID Deyani Hegarty, MD   15 mg at 07/25/24 0850   OLANZapine  zydis (ZYPREXA ) disintegrating tablet 5 mg  5 mg Oral TID PRN Starkes-Perry, Takia S, FNP       ondansetron  (ZOFRAN ) tablet 4 mg  4 mg Oral Q6H PRN Wilkie Majel RAMAN, FNP       Or   ondansetron  (ZOFRAN ) injection 4 mg  4 mg Intravenous Q6H PRN Starkes-Perry, Majel RAMAN, FNP       thiamine  (VITAMIN B1) tablet 100 mg  100 mg Oral Daily Wilkie Majel RAMAN, FNP   100 mg at 07/25/24 9148   traZODone  (DESYREL ) tablet 50 mg  50 mg Oral QHS Starkes-Perry, Takia S, FNP   50 mg at 07/24/24 2117    Lab Results:  No results found for this or any previous visit (from the past 48 hours).   Blood Alcohol level:  Lab Results  Component Value Date   [facility] <15 05/02/2024   ETH <15 05/01/2024    Metabolic Disorder Labs: Lab Results  Component Value Date   HGBA1C 5.6 05/14/2024   MPG 114.02 05/14/2024   MPG 116.89 05/02/2024   Lab Results  Component Value Date   PROLACTIN 13.6 04/16/2015   Lab Results  Component Value Date   CHOL 219 (H) 05/14/2024    TRIG 84 05/14/2024   HDL 83 05/14/2024   CHOLHDL 2.6 05/14/2024   VLDL 17 05/14/2024   LDLCALC 119 (H) 05/14/2024   LDLCALC 130 (H) 05/01/2024    Physical Findings: AIMS:  , ,  ,  ,    CIWA:    COWS:      Psychiatric Specialty Exam:  Presentation  General Appearance:  Casual  Eye Contact: Fair  Speech: Normal rate  Speech Volume: Increased    Mood and Affect  Mood: Angry  Affect: Congruent   Thought Process  Thought Processes: Disorganized  Descriptions of Associations:Tangential  Orientation:Partial  Thought Content:Illogical; Tangential  Hallucinations:RESPONDING TO INTERNAL STIMULI Ideas of Reference:Paranoia; Delusions; Percusatory  Suicidal Thoughts:not endorsing Homicidal Thoughts:nor endorsing  Sensorium  Memory: Recent Poor  Judgment: Poor  Insight: Poor   Executive Functions  Concentration: Poor  Attention Span: Poor  Recall: Poor  Fund of Knowledge: Poor  Language: Poor   Psychomotor Activity  Psychomotor Activity:No data recorded Musculoskeletal: Strength & Muscle Tone: within normal limits Gait & Station: normal Assets  Assets: Resilience    Physical Exam: Physical Exam Vitals and nursing note reviewed.    ROS Blood pressure 110/65, pulse 68, temperature 98.3 F (36.8 C), temperature source Oral, resp. rate 18, height 5' 4 (1.626 m), weight 44.9 kg, SpO2 100%. Body mass index is 16.99 kg/m.  Diagnosis: Active Problems:   Schizophrenia, paranoid (HCC) Paranoid  PLAN: Safety and Monitoring:  -- Involuntary admission to inpatient psychiatric unit for safety, stabilization and treatment  -- Daily contact with patient to assess and evaluate symptoms and progress in treatment  -- Patient's case to be discussed in multi-disciplinary team meeting  -- Observation Level : q15 minute checks  -- Vital signs:  q12 hours  -- Precautions: suicide, elopement, and assault -- Encouraged patient to  participate in unit milieu and in scheduled group therapies  2. Psychiatric Diagnoses and Treatment: Patient  2 physicians approved non emergent forced medications legal guardian gave consent for Haldol  oral or IM  Zyprexa  15 mg BID  Haldol  10 mg bid added - Plan for LAI haldol  Dec 100mg  Q4 weeks DSS gave consent-nonemergent forced medication consented by legal guardian  . Discharge Planning:   -- Social work and case management to assist with discharge planning and identification of hospital follow-up needs prior to discharge  -- Estimated LOS: 3-4 days  Emanuel Dowson, MD 07/25/2024, 8:06 PM

## 2024-07-26 MED ORDER — HALOPERIDOL DECANOATE 100 MG/ML IM SOLN
100.0000 mg | Freq: Once | INTRAMUSCULAR | Status: AC
  Administered 2024-07-26: 100 mg via INTRAMUSCULAR
  Filled 2024-07-26: qty 1

## 2024-07-26 NOTE — Progress Notes (Signed)
 Patient presents: flat affect and remains mostly to self in room. Patient med compliant and received her LAI today. Patient remains minimal but cooperative.    SI/HI/AVH: Denies but observed responding to internal stimuli in her room.    Plan: Denies   Groups attended: 0/3   Appetite: Adequate. Attended meals.   Sleep: No sleep disturbances reported.   PRNS: [name]   Disturbances: No disturbances. Patient remains cooperative in milieu.    Questions/concerns: No further questions or concerns.

## 2024-07-26 NOTE — Group Note (Signed)
 Date:  07/26/2024 Time:  9:53 AM  Group Topic/Focus:  Coping With Mental Health Crisis:   The purpose of this group is to help patients identify strategies for coping with mental health crisis.  Group discusses possible causes of crisis and ways to manage them effectively.  Two motivational and supportive meditation videos were provided to promote relaxation and rejuvenation of the mind, body, and spirit. Patients were guided to reflect on their experiences from 2025 and encouraged to make intentional, healthy decisions for entering 2026 on a positive and balanced path. Time was allotted for guided breathing exercises, allowing patients to focus on deep inhalation and exhalation while engaging in personal reflection.  Participation Level:  Did Not Attend  Participation Quality:    Affect:    Cognitive:    Insight:   Engagement in Group:    Modes of Intervention:    Additional Comments:    Jadakiss Barish L Kabe Mckoy 07/26/2024, 9:53 AM

## 2024-07-26 NOTE — Plan of Care (Signed)
   Problem: Health Behavior/Discharge Planning: Goal: Compliance with treatment plan for underlying cause of condition will improve Outcome: Progressing   Problem: Safety: Goal: Periods of time without injury will increase Outcome: Progressing

## 2024-07-26 NOTE — Plan of Care (Signed)
 Patient alert and oriented. Denies SI, HI, AVH and pain. Scheduled medications per MAR. Support and encouragement provided.  Routine safety checks conducted every 15 minutes.  Patient informed to notify staff with problems or concerns. No adverse drug reactions noted. Patient verbally contracts for safety at this time. Patient interacts well with others on the unit.  Patient remains safe at this time.  Problem: Health Behavior/Discharge Planning: Goal: Identification of resources available to assist in meeting health care needs will improve Outcome: Progressing Goal: Compliance with treatment plan for underlying cause of condition will improve Outcome: Progressing   Problem: Safety: Goal: Ability to remain free from injury will improve Outcome: Progressing

## 2024-07-26 NOTE — Group Note (Signed)
 Date:  07/26/2024 Time:  4:28 PM  Group Topic/Focus:  Coping With Mental Health Crisis:   The purpose of this group is to help patients identify strategies for coping with mental health crisis.  Group discusses possible causes of crisis and ways to manage them effectively. Healthy Communication:   The focus of this group is to discuss communication, barriers to communication, as well as healthy ways to communicate with others. Making Healthy Choices:   The focus of this group is to help patients identify negative/unhealthy choices they were using prior to admission and identify positive/healthier coping strategies to replace them upon discharge.  A group was facilitated focusing on fun, critical-thinking activities. Patients participated in games such as Family Feud and What Would You Do? scenarios. The group encouraged patients to identify positive aspects of situations while engaging in problem-solving and decision-making skills. Patients were given opportunities to share ideas, collaborate, and practice healthy coping strategies in a supportive environment.  Participation Level:  Did Not Attend  Participation Quality:    Affect:    Cognitive:    Insight:   Engagement in Group:    Modes of Intervention:    Additional Comments:    [name] L [name] 07/26/2024, 4:28 PM

## 2024-07-26 NOTE — Progress Notes (Signed)
" °   07/26/24 2000  Psych Admission Type (Psych Patients Only)  Admission Status Involuntary  Psychosocial Assessment  Patient Complaints None  Eye Contact Brief  Facial Expression Flat  Affect Flat  Speech Soft  Interaction Isolative  Motor Activity Slow  Appearance/Hygiene In scrubs  Behavior Characteristics Cooperative  Mood Pleasant  Thought Process  Coherency WDL  Content WDL  Delusions None reported or observed  Perception WDL  Hallucination None reported or observed  Judgment Poor  Confusion Mild  Danger to Self  Current suicidal ideation? Denies  Agreement Not to Harm Self Yes  Description of Agreement Verbal  Danger to Others  Danger to Others None reported or observed    "

## 2024-07-26 NOTE — Progress Notes (Signed)
 Phone call back [facility] MD Progress Note   12:32 PM [name]  MRN:  [id number]  [name] is a 64 y.o. female admitted: Medicallyfor 05/02/2024 10:56 AM for dehydration, failure to thrive and hypoglycemia. She carries the psychiatric diagnoses of Schizophrenia and has medical hx of hypoglycemia and failure to thrive and has a past medical history of sinus bradycardia, chronic anemia, severe malnutrition and recurrent hypoglycemia.. .    Subjective:  Chart reviewed, case discussed in multidisciplinary meeting, patient seen during rounds.    Patient is noted to be standing in the room.  She is noted to be calm but continues to display the same delusions where she refuses to discuss her mental health, discharge planning.  She refused to answer at the questions related to SI/HI/hallucinations.  Past Psychiatric History: see h&P Family History: History reviewed. No pertinent family history. Social History:  Social History   Substance and Sexual Activity  Alcohol Use Not Currently   Comment: Refuses to disclose how much     Social History   Substance and Sexual Activity  Drug Use No   Comment: Refuses to answer    Social History   Socioeconomic History   Marital status: Single    Spouse name: Not on file   Number of children: Not on file   Years of education: Not on file   Highest education level: Not on file  Occupational History   Not on file  Tobacco Use   Smoking status: Every Day    Current packs/day: 2.00    Types: Cigarettes    Passive exposure: Current   Smokeless tobacco: Never  Vaping Use   Vaping status: Never Used  Substance and Sexual Activity   Alcohol use: Not Currently    Comment: Refuses to disclose how much   Drug use: No    Comment: Refuses to answer   Sexual activity: Not Currently    Comment: refused to answer  Other Topics Concern   Not on file  Social History Narrative   Not on file   Social Drivers of Health   Tobacco Use: High Risk  (05/06/2024)   Patient History    Smoking Tobacco Use: Every Day    Smokeless Tobacco Use: Never    Passive Exposure: Current  Financial Resource Strain: Not on file  Food Insecurity: Patient Declined (05/06/2024)   Epic    Worried About Programme Researcher, Broadcasting/film/video in the Last Year: Patient declined    Barista in the Last Year: Patient declined  Transportation Needs: Patient Declined (05/06/2024)   Epic    Lack of Transportation (Medical): Patient declined    Lack of Transportation (Non-Medical): Patient declined  Physical Activity: Not on file  Stress: Not on file  Social Connections: Not on file  Depression (PHQ2-9): Not on file  Alcohol Screen: Low Risk (05/06/2024)   Alcohol Screen    Last Alcohol Screening Score (AUDIT): 0  Housing: Unknown (05/06/2024)   Epic    Unable to Pay for Housing in the Last Year: Patient declined    Number of Times Moved in the Last Year: Not on file    Homeless in the Last Year: Not on file  Utilities: Patient Declined (05/06/2024)   Epic    Threatened with loss of utilities: Patient declined  Health Literacy: Not on file   Past Medical History:  Past Medical History:  Diagnosis Date   Anemia 10/02/2021   Non compliance w medication regimen    Schizophrenia (  [facility])     Past Surgical History:  Procedure Laterality Date   BIOPSY  09/25/2021   Procedure: BIOPSY;  Surgeon: Teressa Toribio SQUIBB, MD;  Location: [facility];  Service: Gastroenterology;;   ESOPHAGOGASTRODUODENOSCOPY (EGD) WITH PROPOFOL  [name] 09/25/2021   Procedure: ESOPHAGOGASTRODUODENOSCOPY (EGD) WITH PROPOFOL ;  Surgeon: Teressa Toribio SQUIBB, MD;  Location: [facility];  Service: Gastroenterology;  Laterality: [name];  With NGT placement    Current Medications: Current Facility-Administered Medications  Medication Dose Route Frequency Provider Last Rate Last Admin   acetaminophen  (TYLENOL ) tablet 650 mg  650 mg Oral Q6H PRN Starkes-Perry, Takia S, FNP       alum & mag hydroxide-simeth  (MAALOX/MYLANTA) 200-200-20 MG/5ML suspension 30 mL  30 mL Oral Q4H PRN Starkes-Perry, Takia S, FNP       haloperidol  (HALDOL ) tablet 5 mg  5 mg Oral BID Jaben Benegas, MD   5 mg at 07/26/24 0900   Or   haloperidol  lactate (HALDOL ) injection 10 mg  10 mg Intramuscular BID [name] Herbison, MD       haloperidol  decanoate (HALDOL  DECANOATE) 100 MG/ML injection 100 mg  100 mg Intramuscular Once Kristoff Coonradt, MD       HYDROcodone -acetaminophen  (NORCO/VICODIN) 5-325 MG per tablet 1-2 tablet  1-2 tablet Oral Q4H PRN Starkes-Perry, Takia S, FNP       magnesium  hydroxide (MILK OF MAGNESIA) suspension 30 mL  30 mL Oral Daily PRN Starkes-Perry, Takia S, FNP       multivitamin with minerals tablet 1 tablet  1 tablet Oral Daily Wilkie Majel RAMAN, FNP   1 tablet at 07/26/24 0901   OLANZapine  (ZYPREXA ) injection 5 mg  5 mg Intramuscular TID PRN Starkes-Perry, Takia S, FNP       OLANZapine  (ZYPREXA ) tablet 15 mg  15 mg Oral BID Chaunta Bejarano, MD   15 mg at 07/26/24 0900   OLANZapine  zydis (ZYPREXA ) disintegrating tablet 5 mg  5 mg Oral TID PRN Starkes-Perry, Takia S, FNP       ondansetron  (ZOFRAN ) tablet 4 mg  4 mg Oral Q6H PRN Starkes-Perry, Majel RAMAN, FNP       Or   ondansetron  (ZOFRAN ) injection 4 mg  4 mg Intravenous Q6H PRN Starkes-Perry, Majel RAMAN, FNP       thiamine  (VITAMIN B1) tablet 100 mg  100 mg Oral Daily Starkes-Perry, Takia S, FNP   100 mg at 07/26/24 0901   traZODone  (DESYREL ) tablet 50 mg  50 mg Oral QHS Starkes-Perry, Takia S, FNP   50 mg at 07/25/24 2121    Lab Results:  No results found for this or any previous visit (from the past 48 hours).   Blood Alcohol level:  Lab Results  Component Value Date   [facility] <15 05/02/2024   ETH <15 05/01/2024    Metabolic Disorder Labs: Lab Results  Component Value Date   HGBA1C 5.6 05/14/2024   MPG 114.02 05/14/2024   MPG 116.89 05/02/2024   Lab Results  Component Value Date   PROLACTIN 13.6 04/16/2015   Lab Results  Component  Value Date   CHOL 219 (H) 05/14/2024   TRIG 84 05/14/2024   HDL 83 05/14/2024   CHOLHDL 2.6 05/14/2024   VLDL 17 05/14/2024   LDLCALC 119 (H) 05/14/2024   LDLCALC 130 (H) 05/01/2024    Physical Findings: AIMS:  , ,  ,  ,    CIWA:    COWS:      Psychiatric Specialty Exam:  Presentation  General Appearance:  Casual  Eye  Contact: Fair  Speech: Normal rate  Speech Volume: Increased    Mood and Affect  Mood: Angry  Affect: Congruent   Thought Process  Thought Processes: Disorganized  Descriptions of Associations:Tangential  Orientation:Partial  Thought Content:Illogical; Tangential  Hallucinations:RESPONDING TO INTERNAL STIMULI Ideas of Reference:Paranoia; Delusions; Percusatory  Suicidal Thoughts:not endorsing Homicidal Thoughts:nor endorsing  Sensorium  Memory: Recent Poor  Judgment: Poor  Insight: Poor   Executive Functions  Concentration: Poor  Attention Span: Poor  Recall: Poor  Fund of Knowledge: Poor  Language: Poor   Psychomotor Activity  Psychomotor Activity:No data recorded Musculoskeletal: Strength & Muscle Tone: within normal limits Gait & Station: normal Assets  Assets: Resilience    Physical Exam: Physical Exam Vitals and nursing note reviewed.    ROS Blood pressure 96/62, pulse (!) 56, temperature (!) 97.3 F (36.3 C), resp. rate 17, height 5' 4 (1.626 m), weight 44.9 kg, SpO2 100%. Body mass index is 16.99 kg/m.  Diagnosis: Active Problems:   Schizophrenia, paranoid (HCC) Paranoid  PLAN: Safety and Monitoring:  -- Involuntary admission to inpatient psychiatric unit for safety, stabilization and treatment  -- Daily contact with patient to assess and evaluate symptoms and progress in treatment  -- Patient's case to be discussed in multi-disciplinary team meeting  -- Observation Level : q15 minute checks  -- Vital signs:  q12 hours  -- Precautions: suicide, elopement, and assault --  Encouraged patient to participate in unit milieu and in scheduled group therapies  2. Psychiatric Diagnoses and Treatment: Patient  2 physicians approved non emergent forced medications legal guardian gave consent for Haldol  oral or IM  Zyprexa  15 mg BID  Haldol  10 mg bid added -  LAI haldol  Dec 100mg  Q4 weeks-07/26/2024 DSS gave consent-nonemergent forced medication consented by legal guardian  . Discharge Planning:   -- Social work and case management to assist with discharge planning and identification of hospital follow-up needs prior to discharge  -- Estimated LOS: 3-4 days  Demontray Franta, MD 07/26/2024, 12:32 PM

## 2024-07-27 NOTE — Plan of Care (Signed)
   Problem: Education: Goal: Emotional status will improve Outcome: Not Progressing Goal: Mental status will improve Outcome: Not Progressing   Problem: Activity: Goal: Interest or engagement in activities will improve Outcome: Not Progressing

## 2024-07-27 NOTE — Group Note (Signed)
 LCSW Group Therapy Note  Group Date: 07/27/2024 Start Time: 1325 End Time: 1340   Type of Therapy and Topic:  Group Therapy - How To Cope with Nervousness about Discharge   Participation Level:  Did Not Attend   Description of Group This process group involved identification of patients' feelings about discharge. Some of them are scheduled to be discharged soon, while others are new admissions, but each of them was asked to share thoughts and feelings surrounding discharge from the hospital. One common theme was that they are excited at the prospect of going home, while another was that many of them are apprehensive about sharing why they were hospitalized. Patients were given the opportunity to discuss these feelings with their peers in preparation for discharge.  Therapeutic Goals  Patient will identify their overall feelings about pending discharge. Patient will think about how they might proactively address issues that they believe will once again arise once they get home (i.e. with parents). Patients will participate in discussion about having hope for change.   Summary of Patient Progress:  X   Therapeutic Modalities Cognitive Behavioral Therapy   [name], LCSWA 07/27/2024  2:18 PM

## 2024-07-27 NOTE — Progress Notes (Signed)
 Phone call back [facility] MD Progress Note   12:26 PM [name]  MRN:  [id number]  [name] is a 64 y.o. female admitted: Medicallyfor 05/02/2024 10:56 AM for dehydration, failure to thrive and hypoglycemia. She carries the psychiatric diagnoses of Schizophrenia and has medical hx of hypoglycemia and failure to thrive and has a past medical history of sinus bradycardia, chronic anemia, severe malnutrition and recurrent hypoglycemia.. .    Subjective:  Chart reviewed, case discussed in multidisciplinary meeting, patient seen during rounds.   Patient is noted to be pacing in the room.  She offers no complaints.  Per nursing she is taking her medications.  She did take Haldol  Decanoate yesterday.  Per nursing patient is coming out of her room and spending some time in the day area.  No behavioral outburst is noted.  Past Psychiatric History: see h&P Family History: History reviewed. No pertinent family history. Social History:  Social History   Substance and Sexual Activity  Alcohol Use Not Currently   Comment: Refuses to disclose how much     Social History   Substance and Sexual Activity  Drug Use No   Comment: Refuses to answer    Social History   Socioeconomic History   Marital status: Single    Spouse name: Not on file   Number of children: Not on file   Years of education: Not on file   Highest education level: Not on file  Occupational History   Not on file  Tobacco Use   Smoking status: Every Day    Current packs/day: 2.00    Types: Cigarettes    Passive exposure: Current   Smokeless tobacco: Never  Vaping Use   Vaping status: Never Used  Substance and Sexual Activity   Alcohol use: Not Currently    Comment: Refuses to disclose how much   Drug use: No    Comment: Refuses to answer   Sexual activity: Not Currently    Comment: refused to answer  Other Topics Concern   Not on file  Social History Narrative   Not on file   Social Drivers of Health   Tobacco  Use: High Risk (05/06/2024)   Patient History    Smoking Tobacco Use: Every Day    Smokeless Tobacco Use: Never    Passive Exposure: Current  Financial Resource Strain: Not on file  Food Insecurity: Patient Declined (05/06/2024)   Epic    Worried About Programme Researcher, Broadcasting/film/video in the Last Year: Patient declined    Barista in the Last Year: Patient declined  Transportation Needs: Patient Declined (05/06/2024)   Epic    Lack of Transportation (Medical): Patient declined    Lack of Transportation (Non-Medical): Patient declined  Physical Activity: Not on file  Stress: Not on file  Social Connections: Not on file  Depression (PHQ2-9): Not on file  Alcohol Screen: Low Risk (05/06/2024)   Alcohol Screen    Last Alcohol Screening Score (AUDIT): 0  Housing: Unknown (05/06/2024)   Epic    Unable to Pay for Housing in the Last Year: Patient declined    Number of Times Moved in the Last Year: Not on file    Homeless in the Last Year: Not on file  Utilities: Patient Declined (05/06/2024)   Epic    Threatened with loss of utilities: Patient declined  Health Literacy: Not on file   Past Medical History:  Past Medical History:  Diagnosis Date   Anemia 10/02/2021   Non  compliance w medication regimen    Schizophrenia [facility])     Past Surgical History:  Procedure Laterality Date   BIOPSY  09/25/2021   Procedure: BIOPSY;  Surgeon: Teressa Toribio SQUIBB, MD;  Location: [facility];  Service: Gastroenterology;;   ESOPHAGOGASTRODUODENOSCOPY (EGD) WITH PROPOFOL  [name] 09/25/2021   Procedure: ESOPHAGOGASTRODUODENOSCOPY (EGD) WITH PROPOFOL ;  Surgeon: Teressa Toribio SQUIBB, MD;  Location: [facility];  Service: Gastroenterology;  Laterality: [name];  With NGT placement    Current Medications: Current Facility-Administered Medications  Medication Dose Route Frequency Provider Last Rate Last Admin   acetaminophen  (TYLENOL ) tablet 650 mg  650 mg Oral Q6H PRN Starkes-Perry, Takia S, FNP       alum & mag  hydroxide-simeth (MAALOX/MYLANTA) 200-200-20 MG/5ML suspension 30 mL  30 mL Oral Q4H PRN Starkes-Perry, Takia S, FNP       haloperidol  (HALDOL ) tablet 5 mg  5 mg Oral BID Donta Fuster, MD   5 mg at 07/27/24 9040   Or   haloperidol  lactate (HALDOL ) injection 10 mg  10 mg Intramuscular BID Aulton Routt, MD       HYDROcodone -acetaminophen  (NORCO/VICODIN) 5-325 MG per tablet 1-2 tablet  1-2 tablet Oral Q4H PRN Starkes-Perry, Takia S, FNP       magnesium  hydroxide (MILK OF MAGNESIA) suspension 30 mL  30 mL Oral Daily PRN Starkes-Perry, Takia S, FNP       multivitamin with minerals tablet 1 tablet  1 tablet Oral Daily Wilkie Majel RAMAN, FNP   1 tablet at 07/27/24 9040   OLANZapine  (ZYPREXA ) injection 5 mg  5 mg Intramuscular TID PRN Wilkie Majel RAMAN, FNP       OLANZapine  (ZYPREXA ) tablet 15 mg  15 mg Oral BID Verbena Boeding, MD   15 mg at 07/27/24 1000   OLANZapine  zydis (ZYPREXA ) disintegrating tablet 5 mg  5 mg Oral TID PRN Starkes-Perry, Takia S, FNP       ondansetron  (ZOFRAN ) tablet 4 mg  4 mg Oral Q6H PRN Starkes-Perry, Majel RAMAN, FNP       Or   ondansetron  (ZOFRAN ) injection 4 mg  4 mg Intravenous Q6H PRN Starkes-Perry, Majel RAMAN, FNP       thiamine  (VITAMIN B1) tablet 100 mg  100 mg Oral Daily Wilkie Majel RAMAN, FNP   100 mg at 07/27/24 9040   traZODone  (DESYREL ) tablet 50 mg  50 mg Oral QHS Starkes-Perry, Takia S, FNP   50 mg at 07/26/24 2048    Lab Results:  No results found for this or any previous visit (from the past 48 hours).   Blood Alcohol level:  Lab Results  Component Value Date   [facility] <15 05/02/2024   ETH <15 05/01/2024    Metabolic Disorder Labs: Lab Results  Component Value Date   HGBA1C 5.6 05/14/2024   MPG 114.02 05/14/2024   MPG 116.89 05/02/2024   Lab Results  Component Value Date   PROLACTIN 13.6 04/16/2015   Lab Results  Component Value Date   CHOL 219 (H) 05/14/2024   TRIG 84 05/14/2024   HDL 83 05/14/2024   CHOLHDL 2.6 05/14/2024    VLDL 17 05/14/2024   LDLCALC 119 (H) 05/14/2024   LDLCALC 130 (H) 05/01/2024    Physical Findings: AIMS:  , ,  ,  ,    CIWA:    COWS:      Psychiatric Specialty Exam:  Presentation  General Appearance:  Casual  Eye Contact: Fair  Speech: Normal rate  Speech Volume: Increased    Mood and  Affect  Mood: Angry  Affect: Congruent   Thought Process  Thought Processes: Disorganized  Descriptions of Associations:Tangential  Orientation:Partial  Thought Content:Illogical; Tangential  Hallucinations:RESPONDING TO INTERNAL STIMULI Ideas of Reference:Paranoia; Delusions; Percusatory  Suicidal Thoughts:not endorsing Homicidal Thoughts:nor endorsing  Sensorium  Memory: Recent Poor  Judgment: Poor  Insight: Poor   Executive Functions  Concentration: Poor  Attention Span: Poor  Recall: Poor  Fund of Knowledge: Poor  Language: Poor   Psychomotor Activity  Psychomotor Activity:No data recorded Musculoskeletal: Strength & Muscle Tone: within normal limits Gait & Station: normal Assets  Assets: Resilience    Physical Exam: Physical Exam Vitals and nursing note reviewed.    ROS Blood pressure (!) 101/56, pulse 64, temperature 98 F (36.7 C), resp. rate 16, height 5' 4 (1.626 m), weight 44.9 kg, SpO2 100%. Body mass index is 16.99 kg/m.  Diagnosis: Active Problems:   Schizophrenia, paranoid (HCC) Paranoid  PLAN: Safety and Monitoring:  -- Involuntary admission to inpatient psychiatric unit for safety, stabilization and treatment  -- Daily contact with patient to assess and evaluate symptoms and progress in treatment  -- Patient's case to be discussed in multi-disciplinary team meeting  -- Observation Level : q15 minute checks  -- Vital signs:  q12 hours  -- Precautions: suicide, elopement, and assault -- Encouraged patient to participate in unit milieu and in scheduled group therapies  2. Psychiatric Diagnoses and Treatment:  Patient  2 physicians approved non emergent forced medications legal guardian gave consent for Haldol  oral or IM  Zyprexa  15 mg BID  Haldol  10 mg bid added -  LAI haldol  Dec 100mg  Q4 weeks-07/26/2024 DSS gave consent-nonemergent forced medication consented by legal guardian  . Discharge Planning:   -- Social work and case management to assist with discharge planning and identification of hospital follow-up needs prior to discharge  -- Estimated LOS: 3-4 days  Dossie Swor, MD 07/27/2024, 12:26 PM

## 2024-07-27 NOTE — Progress Notes (Signed)
 Behavior:   Pleasant and cooperative.   Psych assessment: Denies SI/HI.    Interaction / Group attendance:  Present in milieu 30+ minutes before lunch and dinner. Minimal interaction. Did not attend groups today.   Medication/ PRNs: Compliant.  Pain: Denies.    15 min checks in place for safety.

## 2024-07-27 NOTE — Group Note (Signed)
 Date:  07/27/2024 Time:  3:45 PM  Group Topic/Focus:  Movement Therapy, Getting fit with Keeshia Sanderlin.    Participation Level:  Did Not Attend    [name] Bias 07/27/2024, 3:45 PM

## 2024-07-27 NOTE — Group Note (Signed)
 Date:  07/27/2024 Time:  10:52 AM  Group Topic/Focus:  Rediscovering Joy:   The focus of this group is to explore various ways to relieve stress in a positive manner.    Participation Level:  Did Not Attend   [name] [name] 07/27/2024, 10:52 AM

## 2024-07-27 NOTE — Group Note (Signed)
 Date:  07/27/2024 Time:  8:47 PM  Group Topic/Focus:  Wrap-Up Group:   The focus of this group is to help patients review their daily goal of treatment and discuss progress on daily workbooks.    Participation Level:  Did Not Attend  Beatris ONEIDA Hasten 07/27/2024, 8:47 PM

## 2024-07-27 NOTE — Group Note (Signed)
 Recreation Therapy Group Note   Group Topic:Leisure Education  Group Date: 07/27/2024 Start Time: 1415 End Time: 1500 Facilitators: Celestia Jeoffrey BRAVO, LRT, CTRS Location: Courtyard  Group Description: Outdoor Recreation. Patients had the option to play corn hole, ring toss, UNO, or listening to music while outside in the courtyard getting fresh air and sunlight.  LRT and patients discussed things that they enjoy doing in their free time outside of the hospital. LRT encouraged patients to drink water  after being active and getting their heart rate up.   Goal Area(s) Addressed: Patient will identify leisure interests.  Patient will practice healthy decision making. Patient will engage in recreation activity.   Affect/Mood: [name]   Participation Level: Did not attend    Clinical Observations/Individualized Feedback: Patient did not attend.  Plan: Continue to engage patient in RT group sessions 2-3x/week.   Jeoffrey BRAVO Celestia, LRT, CTRS 07/27/2024 4:15 PM

## 2024-07-27 NOTE — Plan of Care (Signed)
  Problem: Activity: Goal: Interest or engagement in activities will improve Outcome: Not Progressing   Problem: Coping: Goal: Ability to demonstrate self-control will improve Outcome: Progressing

## 2024-07-28 NOTE — Plan of Care (Signed)
   Problem: Physical Regulation: Goal: Ability to maintain clinical measurements within normal limits will improve Outcome: Progressing   Problem: Safety: Goal: Periods of time without injury will increase Outcome: Progressing

## 2024-07-28 NOTE — Progress Notes (Signed)
" °   07/27/24 2010  Psych Admission Type (Psych Patients Only)  Admission Status Involuntary  Psychosocial Assessment  Patient Complaints None  Eye Contact Brief  Facial Expression Flat  Affect Flat  Speech Soft  Interaction Isolative  Motor Activity Slow  Appearance/Hygiene In scrubs  Behavior Characteristics Cooperative;Calm  Mood Pleasant  Thought Process  Coherency WDL  Content WDL  Delusions None reported or observed  Perception WDL  Hallucination None reported or observed  Judgment Poor  Confusion Mild  Danger to Self  Current suicidal ideation? Denies  Agreement Not to Harm Self Yes  Description of Agreement Verbal  Danger to Others  Danger to Others None reported or observed    "

## 2024-07-28 NOTE — Group Note (Signed)
 Physical/Occupational Therapy Group Note  Group Topic: UE Therex   Group Date: 07/28/2024 Start Time: 1305 End Time: 1335 Facilitators: Clive Warren CROME, OT   Group Description: Group instructed in series of upper extremities exercises, aimed to promote strength, flexibility, range of motion and functional endurance.  Patients provided cuing for proper mechanics and proper pace of exercise; exercises adjusted as necessary for individualized patient needs.  Patient also engaged in cognitive components throughout session, working to integrate attention to task, command following, turn-taking and appropriate social interaction throughout session.  Allowed to ask questions as appropriate, and encouraged to identify specific exercises that they could complete independently outside of group sessions.  Therapeutic Goal(s):  Demonstrate appropriate performance of upper extremity exercises to promote strength, flexibility, range of motion and functional endurance Identify 2-3 specific upper extremity exercises to complete as home exercise program outside of group session  Individual Participation: Did not attend.  Participation Level: Did not attend   Participation Quality:   Behavior:   Speech/Thought Process:   Affect/Mood:   Insight:   Judgement:   Modes of Intervention:   Patient Response to Interventions:    Plan: Continue to engage patient in PT/OT groups 1 - 2x/week.  Lillyn Wieczorek R., MPH, MS, OTR/L ascom [phone] 07/28/2024, 2:53 PM

## 2024-07-28 NOTE — Progress Notes (Signed)
 Behavior:  Pleasant and cooperative.  Present in dayroom prior to meals. Minimal interaction.   Psych assessment: Denies SI/HI.   Group attendance:  0 / 2.  Medication/ PRNs: Compliant.  Pain: Denies.  15 min checks in place for safety.

## 2024-07-28 NOTE — Group Note (Signed)
 Date:  07/28/2024 Time:  10:35 AM  Group Topic/Focus:  Coping With Mental Health Crisis:   The purpose of this group is to help patients identify strategies for coping with mental health crisis.  Group discusses possible causes of crisis and ways to manage them effectively.    Participation Level:  Did Not Attend    [name] Bias 07/28/2024, 10:35 AM

## 2024-07-28 NOTE — Progress Notes (Signed)
 Phone call back [facility] MD Progress Note   12:06 PM [name]  MRN:  [id number]  [name] is a 64 y.o. female admitted: Medicallyfor 05/02/2024 10:56 AM for dehydration, failure to thrive and hypoglycemia. She carries the psychiatric diagnoses of Schizophrenia and has medical hx of hypoglycemia and failure to thrive and has a past medical history of sinus bradycardia, chronic anemia, severe malnutrition and recurrent hypoglycemia.. .    Subjective:  Chart reviewed, case discussed in multidisciplinary meeting, patient seen during rounds.    Today on interview patient is noted to be sitting in her room in dark.  She refuses to turn on the lights.  She complains of bed being hard but does not give details about sleep and appetite.  She remains discharge focused and refuses to answer questions related to SI/HI and hallucinations.  Patient remains delusional and unable to process information regarding having a family, recent legal guardianship and APS working on placement.    Past Psychiatric History: see h&P Family History: History reviewed. No pertinent family history. Social History:  Social History   Substance and Sexual Activity  Alcohol Use Not Currently   Comment: Refuses to disclose how much     Social History   Substance and Sexual Activity  Drug Use No   Comment: Refuses to answer    Social History   Socioeconomic History   Marital status: Single    Spouse name: Not on file   Number of children: Not on file   Years of education: Not on file   Highest education level: Not on file  Occupational History   Not on file  Tobacco Use   Smoking status: Every Day    Current packs/day: 2.00    Types: Cigarettes    Passive exposure: Current   Smokeless tobacco: Never  Vaping Use   Vaping status: Never Used  Substance and Sexual Activity   Alcohol use: Not Currently    Comment: Refuses to disclose how much   Drug use: No    Comment: Refuses to answer   Sexual activity:  Not Currently    Comment: refused to answer  Other Topics Concern   Not on file  Social History Narrative   Not on file   Social Drivers of Health   Tobacco Use: High Risk (05/06/2024)   Patient History    Smoking Tobacco Use: Every Day    Smokeless Tobacco Use: Never    Passive Exposure: Current  Financial Resource Strain: Not on file  Food Insecurity: Patient Declined (05/06/2024)   Epic    Worried About Programme Researcher, Broadcasting/film/video in the Last Year: Patient declined    Barista in the Last Year: Patient declined  Transportation Needs: Patient Declined (05/06/2024)   Epic    Lack of Transportation (Medical): Patient declined    Lack of Transportation (Non-Medical): Patient declined  Physical Activity: Not on file  Stress: Not on file  Social Connections: Not on file  Depression (PHQ2-9): Not on file  Alcohol Screen: Low Risk (05/06/2024)   Alcohol Screen    Last Alcohol Screening Score (AUDIT): 0  Housing: Unknown (05/06/2024)   Epic    Unable to Pay for Housing in the Last Year: Patient declined    Number of Times Moved in the Last Year: Not on file    Homeless in the Last Year: Not on file  Utilities: Patient Declined (05/06/2024)   Epic    Threatened with loss of utilities: Patient declined  Health Literacy: Not on file   Past Medical History:  Past Medical History:  Diagnosis Date   Anemia 10/02/2021   Non compliance w medication regimen    Schizophrenia [facility])     Past Surgical History:  Procedure Laterality Date   BIOPSY  09/25/2021   Procedure: BIOPSY;  Surgeon: Teressa Toribio SQUIBB, MD;  Location: [facility];  Service: Gastroenterology;;   ESOPHAGOGASTRODUODENOSCOPY (EGD) WITH PROPOFOL  [name] 09/25/2021   Procedure: ESOPHAGOGASTRODUODENOSCOPY (EGD) WITH PROPOFOL ;  Surgeon: Teressa Toribio SQUIBB, MD;  Location: [facility];  Service: Gastroenterology;  Laterality: [name];  With NGT placement    Current Medications: Current Facility-Administered Medications  Medication  Dose Route Frequency Provider Last Rate Last Admin   acetaminophen  (TYLENOL ) tablet 650 mg  650 mg Oral Q6H PRN Starkes-Perry, Takia S, FNP       alum & mag hydroxide-simeth (MAALOX/MYLANTA) 200-200-20 MG/5ML suspension 30 mL  30 mL Oral Q4H PRN Starkes-Perry, Takia S, FNP       haloperidol  (HALDOL ) tablet 5 mg  5 mg Oral BID Tangala Wiegert, MD   5 mg at 07/28/24 1015   Or   haloperidol  lactate (HALDOL ) injection 10 mg  10 mg Intramuscular BID Claudia Greenley, MD       HYDROcodone -acetaminophen  (NORCO/VICODIN) 5-325 MG per tablet 1-2 tablet  1-2 tablet Oral Q4H PRN Starkes-Perry, Takia S, FNP       magnesium  hydroxide (MILK OF MAGNESIA) suspension 30 mL  30 mL Oral Daily PRN Starkes-Perry, Takia S, FNP       multivitamin with minerals tablet 1 tablet  1 tablet Oral Daily Wilkie Majel RAMAN, FNP   1 tablet at 07/28/24 1009   OLANZapine  (ZYPREXA ) injection 5 mg  5 mg Intramuscular TID PRN Wilkie Majel RAMAN, FNP       OLANZapine  (ZYPREXA ) tablet 15 mg  15 mg Oral BID Almarie Kurdziel, MD   15 mg at 07/28/24 1014   OLANZapine  zydis (ZYPREXA ) disintegrating tablet 5 mg  5 mg Oral TID PRN Starkes-Perry, Takia S, FNP       ondansetron  (ZOFRAN ) tablet 4 mg  4 mg Oral Q6H PRN Starkes-Perry, Majel RAMAN, FNP       Or   ondansetron  (ZOFRAN ) injection 4 mg  4 mg Intravenous Q6H PRN Starkes-Perry, Majel RAMAN, FNP       thiamine  (VITAMIN B1) tablet 100 mg  100 mg Oral Daily Wilkie Majel RAMAN, FNP   100 mg at 07/28/24 1009   traZODone  (DESYREL ) tablet 50 mg  50 mg Oral QHS Starkes-Perry, Takia S, FNP   50 mg at 07/27/24 2109    Lab Results:  No results found for this or any previous visit (from the past 48 hours).   Blood Alcohol level:  Lab Results  Component Value Date   [facility] <15 05/02/2024   ETH <15 05/01/2024    Metabolic Disorder Labs: Lab Results  Component Value Date   HGBA1C 5.6 05/14/2024   MPG 114.02 05/14/2024   MPG 116.89 05/02/2024   Lab Results  Component Value Date    PROLACTIN 13.6 04/16/2015   Lab Results  Component Value Date   CHOL 219 (H) 05/14/2024   TRIG 84 05/14/2024   HDL 83 05/14/2024   CHOLHDL 2.6 05/14/2024   VLDL 17 05/14/2024   LDLCALC 119 (H) 05/14/2024   LDLCALC 130 (H) 05/01/2024    Physical Findings: AIMS:  , ,  ,  ,    CIWA:    COWS:      Psychiatric Specialty Exam:  Presentation  General Appearance:  Casual  Eye Contact: Fair  Speech: Normal rate  Speech Volume: Increased    Mood and Affect  Mood: Angry  Affect: Congruent   Thought Process  Thought Processes: Disorganized  Descriptions of Associations:Tangential  Orientation:Partial  Thought Content:Illogical; Tangential  Hallucinations:RESPONDING TO INTERNAL STIMULI Ideas of Reference:Paranoia; Delusions; Percusatory  Suicidal Thoughts:not endorsing Homicidal Thoughts:nor endorsing  Sensorium  Memory: Recent Poor  Judgment: Poor  Insight: Poor   Executive Functions  Concentration: Poor  Attention Span: Poor  Recall: Poor  Fund of Knowledge: Poor  Language: Poor   Psychomotor Activity  Psychomotor Activity:No data recorded Musculoskeletal: Strength & Muscle Tone: within normal limits Gait & Station: normal Assets  Assets: Resilience    Physical Exam: Physical Exam Vitals and nursing note reviewed.    ROS Blood pressure (!) 92/58, pulse 65, temperature 97.9 F (36.6 C), resp. rate 16, height 5' 4 (1.626 m), weight 44.9 kg, SpO2 99%. Body mass index is 16.99 kg/m.  Diagnosis: Active Problems:   Schizophrenia, paranoid (HCC) Paranoid  PLAN: Safety and Monitoring:  -- Involuntary admission to inpatient psychiatric unit for safety, stabilization and treatment  -- Daily contact with patient to assess and evaluate symptoms and progress in treatment  -- Patient's case to be discussed in multi-disciplinary team meeting  -- Observation Level : q15 minute checks  -- Vital signs:  q12 hours  --  Precautions: suicide, elopement, and assault -- Encouraged patient to participate in unit milieu and in scheduled group therapies  2. Psychiatric Diagnoses and Treatment: Patient  2 physicians approved non emergent forced medications legal guardian gave consent for Haldol  oral or IM  Zyprexa  15 mg BID  Haldol  10 mg bid added -  LAI haldol  Dec 100mg  Q4 weeks-07/26/2024 DSS gave consent-nonemergent forced medication consented by legal guardian  . Discharge Planning:   -- Social work and case management to assist with discharge planning and identification of hospital follow-up needs prior to discharge  -- Estimated LOS: 3-4 days  Shikita Vaillancourt, MD 07/28/2024, 12:06 PM

## 2024-07-28 NOTE — Plan of Care (Signed)
" °  Problem: Nutrition: Goal: Adequate nutrition will be maintained Outcome: Progressing   Problem: Elimination: Goal: Will not experience complications related to bowel motility Outcome: Progressing   Problem: Safety: Goal: Ability to remain free from injury will improve Outcome: Progressing   Problem: Activity: Goal: Interest or engagement in activities will improve Outcome: Not Progressing   "

## 2024-07-29 NOTE — Plan of Care (Signed)
   Problem: Education: Goal: Emotional status will improve Outcome: Progressing

## 2024-07-29 NOTE — Progress Notes (Signed)
 Phone call back [facility] MD Progress Note   1:10 PM [name]  MRN:  [id number]  [name] is a 64 y.o. female admitted: Medicallyfor 05/02/2024 10:56 AM for dehydration, failure to thrive and hypoglycemia. She carries the psychiatric diagnoses of Schizophrenia and has medical hx of hypoglycemia and failure to thrive and has a past medical history of sinus bradycardia, chronic anemia, severe malnutrition and recurrent hypoglycemia.. .    Subjective:  Chart reviewed, case discussed in multidisciplinary meeting, patient seen during rounds.    On interview patient is noted to be sitting in the day area for sometimes but was not engaging with the peers or staff.  She later on went back to her room and is noted to be standing in dark.  She refuses to turn on the lights.  She remains minimally engaged but calm.  She offers no complaints and reports fair appetite and sleep.  She refuses to go into detail about her mental health treatment plan and discharge planning.  Per nursing patient is taking her medications with no reported side effects.   Past Psychiatric History: see h&P Family History: History reviewed. No pertinent family history. Social History:  Social History   Substance and Sexual Activity  Alcohol Use Not Currently   Comment: Refuses to disclose how much     Social History   Substance and Sexual Activity  Drug Use No   Comment: Refuses to answer    Social History   Socioeconomic History   Marital status: Single    Spouse name: Not on file   Number of children: Not on file   Years of education: Not on file   Highest education level: Not on file  Occupational History   Not on file  Tobacco Use   Smoking status: Every Day    Current packs/day: 2.00    Types: Cigarettes    Passive exposure: Current   Smokeless tobacco: Never  Vaping Use   Vaping status: Never Used  Substance and Sexual Activity   Alcohol use: Not Currently    Comment: Refuses to disclose how much    Drug use: No    Comment: Refuses to answer   Sexual activity: Not Currently    Comment: refused to answer  Other Topics Concern   Not on file  Social History Narrative   Not on file   Social Drivers of Health   Tobacco Use: High Risk (05/06/2024)   Patient History    Smoking Tobacco Use: Every Day    Smokeless Tobacco Use: Never    Passive Exposure: Current  Financial Resource Strain: Not on file  Food Insecurity: Patient Declined (05/06/2024)   Epic    Worried About Programme Researcher, Broadcasting/film/video in the Last Year: Patient declined    Barista in the Last Year: Patient declined  Transportation Needs: Patient Declined (05/06/2024)   Epic    Lack of Transportation (Medical): Patient declined    Lack of Transportation (Non-Medical): Patient declined  Physical Activity: Not on file  Stress: Not on file  Social Connections: Not on file  Depression (PHQ2-9): Not on file  Alcohol Screen: Low Risk (05/06/2024)   Alcohol Screen    Last Alcohol Screening Score (AUDIT): 0  Housing: Unknown (05/06/2024)   Epic    Unable to Pay for Housing in the Last Year: Patient declined    Number of Times Moved in the Last Year: Not on file    Homeless in the Last Year: Not on  file  Utilities: Patient Declined (05/06/2024)   Epic    Threatened with loss of utilities: Patient declined  Health Literacy: Not on file   Past Medical History:  Past Medical History:  Diagnosis Date   Anemia 10/02/2021   Non compliance w medication regimen    Schizophrenia [facility])     Past Surgical History:  Procedure Laterality Date   BIOPSY  09/25/2021   Procedure: BIOPSY;  Surgeon: Teressa Toribio SQUIBB, MD;  Location: [facility];  Service: Gastroenterology;;   ESOPHAGOGASTRODUODENOSCOPY (EGD) WITH PROPOFOL  [name] 09/25/2021   Procedure: ESOPHAGOGASTRODUODENOSCOPY (EGD) WITH PROPOFOL ;  Surgeon: Teressa Toribio SQUIBB, MD;  Location: [facility];  Service: Gastroenterology;  Laterality: [name];  With NGT placement    Current  Medications: Current Facility-Administered Medications  Medication Dose Route Frequency Provider Last Rate Last Admin   acetaminophen  (TYLENOL ) tablet 650 mg  650 mg Oral Q6H PRN Starkes-Perry, Takia S, FNP       alum & mag hydroxide-simeth (MAALOX/MYLANTA) 200-200-20 MG/5ML suspension 30 mL  30 mL Oral Q4H PRN Starkes-Perry, Takia S, FNP       haloperidol  (HALDOL ) tablet 5 mg  5 mg Oral BID Latessa Tillis, MD   5 mg at 07/29/24 9162   Or   haloperidol  lactate (HALDOL ) injection 10 mg  10 mg Intramuscular BID Durk Carmen, MD       HYDROcodone -acetaminophen  (NORCO/VICODIN) 5-325 MG per tablet 1-2 tablet  1-2 tablet Oral Q4H PRN Starkes-Perry, Takia S, FNP       magnesium  hydroxide (MILK OF MAGNESIA) suspension 30 mL  30 mL Oral Daily PRN Starkes-Perry, Takia S, FNP       multivitamin with minerals tablet 1 tablet  1 tablet Oral Daily Starkes-Perry, Takia S, FNP   1 tablet at 07/29/24 9162   OLANZapine  (ZYPREXA ) injection 5 mg  5 mg Intramuscular TID PRN Wilkie Majel RAMAN, FNP       OLANZapine  (ZYPREXA ) tablet 15 mg  15 mg Oral BID Deanna Wiater, MD   15 mg at 07/29/24 9162   OLANZapine  zydis (ZYPREXA ) disintegrating tablet 5 mg  5 mg Oral TID PRN Starkes-Perry, Takia S, FNP       ondansetron  (ZOFRAN ) tablet 4 mg  4 mg Oral Q6H PRN Starkes-Perry, Majel RAMAN, FNP       Or   ondansetron  (ZOFRAN ) injection 4 mg  4 mg Intravenous Q6H PRN Starkes-Perry, Majel RAMAN, FNP       thiamine  (VITAMIN B1) tablet 100 mg  100 mg Oral Daily Wilkie Majel RAMAN, FNP   100 mg at 07/29/24 9162   traZODone  (DESYREL ) tablet 50 mg  50 mg Oral QHS Starkes-Perry, Takia S, FNP   50 mg at 07/28/24 2202    Lab Results:  No results found for this or any previous visit (from the past 48 hours).   Blood Alcohol level:  Lab Results  Component Value Date   [facility] <15 05/02/2024   ETH <15 05/01/2024    Metabolic Disorder Labs: Lab Results  Component Value Date   HGBA1C 5.6 05/14/2024   MPG 114.02 05/14/2024    MPG 116.89 05/02/2024   Lab Results  Component Value Date   PROLACTIN 13.6 04/16/2015   Lab Results  Component Value Date   CHOL 219 (H) 05/14/2024   TRIG 84 05/14/2024   HDL 83 05/14/2024   CHOLHDL 2.6 05/14/2024   VLDL 17 05/14/2024   LDLCALC 119 (H) 05/14/2024   LDLCALC 130 (H) 05/01/2024    Physical Findings: AIMS:  , ,  ,  ,  CIWA:    COWS:      Psychiatric Specialty Exam:  Presentation  General Appearance:  Casual  Eye Contact: Fair  Speech: Normal rate  Speech Volume: Increased    Mood and Affect  Mood: Angry  Affect: Congruent   Thought Process  Thought Processes: Disorganized  Descriptions of Associations:Tangential  Orientation:Partial  Thought Content:Illogical; Tangential  Hallucinations:RESPONDING TO INTERNAL STIMULI Ideas of Reference:Paranoia; Delusions; Percusatory  Suicidal Thoughts:not endorsing Homicidal Thoughts:nor endorsing  Sensorium  Memory: Recent Poor  Judgment: Poor  Insight: Poor   Executive Functions  Concentration: Poor  Attention Span: Poor  Recall: Poor  Fund of Knowledge: Poor  Language: Poor   Psychomotor Activity  Psychomotor Activity:No data recorded Musculoskeletal: Strength & Muscle Tone: within normal limits Gait & Station: normal Assets  Assets: Resilience    Physical Exam: Physical Exam Vitals and nursing note reviewed.    ROS Blood pressure 103/66, pulse 61, temperature 97.9 F (36.6 C), resp. rate 18, height 5' 4 (1.626 m), weight 44.9 kg, SpO2 99%. Body mass index is 16.99 kg/m.  Diagnosis: Active Problems:   Schizophrenia, paranoid (HCC) Paranoid  PLAN: Safety and Monitoring:  -- Involuntary admission to inpatient psychiatric unit for safety, stabilization and treatment  -- Daily contact with patient to assess and evaluate symptoms and progress in treatment  -- Patient's case to be discussed in multi-disciplinary team meeting  -- Observation Level :  q15 minute checks  -- Vital signs:  q12 hours  -- Precautions: suicide, elopement, and assault -- Encouraged patient to participate in unit milieu and in scheduled group therapies  2. Psychiatric Diagnoses and Treatment: Patient  2 physicians approved non emergent forced medications legal guardian gave consent for Haldol  oral or IM  Zyprexa  15 mg BID  Haldol  10 mg bid added -  LAI haldol  Dec 100mg  Q4 weeks-07/26/2024 DSS gave consent-nonemergent forced medication consented by legal guardian  . Discharge Planning:   -- Social work and case management to assist with discharge planning and identification of hospital follow-up needs prior to discharge  -- Estimated LOS: 3-4 days  Eluzer Howdeshell, MD 07/29/2024, 1:10 PM

## 2024-07-29 NOTE — Plan of Care (Signed)
  Problem: Activity: Goal: Sleeping patterns will improve Outcome: Progressing   

## 2024-07-29 NOTE — BH IP Treatment Plan (Signed)
 Interdisciplinary Treatment and Diagnostic Plan Update  07/29/2024 Time of Session: 120pm [name] MRN: [id number]  Principal Diagnosis: <principal problem not specified>  Secondary Diagnoses: Active Problems:   Schizophrenia, paranoid (HCC)   Current Medications:  Current Facility-Administered Medications  Medication Dose Route Frequency Provider Last Rate Last Admin   acetaminophen  (TYLENOL ) tablet 650 mg  650 mg Oral Q6H PRN Starkes-Perry, Takia S, FNP       alum & mag hydroxide-simeth (MAALOX/MYLANTA) 200-200-20 MG/5ML suspension 30 mL  30 mL Oral Q4H PRN Starkes-Perry, Takia S, FNP       haloperidol  (HALDOL ) tablet 5 mg  5 mg Oral BID Jadapalle, Sree, MD   5 mg at 07/29/24 9162   Or   haloperidol  lactate (HALDOL ) injection 10 mg  10 mg Intramuscular BID Jadapalle, Sree, MD       HYDROcodone -acetaminophen  (NORCO/VICODIN) 5-325 MG per tablet 1-2 tablet  1-2 tablet Oral Q4H PRN Starkes-Perry, Takia S, FNP       magnesium  hydroxide (MILK OF MAGNESIA) suspension 30 mL  30 mL Oral Daily PRN Starkes-Perry, Takia S, FNP       multivitamin with minerals tablet 1 tablet  1 tablet Oral Daily Wilkie Majel RAMAN, FNP   1 tablet at 07/29/24 9162   OLANZapine  (ZYPREXA ) injection 5 mg  5 mg Intramuscular TID PRN Wilkie Majel RAMAN, FNP       OLANZapine  (ZYPREXA ) tablet 15 mg  15 mg Oral BID Jadapalle, Sree, MD   15 mg at 07/29/24 9162   OLANZapine  zydis (ZYPREXA ) disintegrating tablet 5 mg  5 mg Oral TID PRN Starkes-Perry, Takia S, FNP       ondansetron  (ZOFRAN ) tablet 4 mg  4 mg Oral Q6H PRN Wilkie Majel RAMAN, FNP       Or   ondansetron  (ZOFRAN ) injection 4 mg  4 mg Intravenous Q6H PRN Starkes-Perry, Majel RAMAN, FNP       thiamine  (VITAMIN B1) tablet 100 mg  100 mg Oral Daily Starkes-Perry, Takia S, FNP   100 mg at 07/29/24 9162   traZODone  (DESYREL ) tablet 50 mg  50 mg Oral QHS Starkes-Perry, Takia S, FNP   50 mg at 07/28/24 2202   PTA Medications: Medications Prior to Admission   Medication Sig Dispense Refill Last Dose/Taking   feeding supplement (ENSURE PLUS HIGH PROTEIN) LIQD Take 237 mLs by mouth 3 (three) times daily between meals.      Multiple Vitamin (MULTIVITAMIN WITH MINERALS) TABS tablet Take 1 tablet by mouth daily.      OLANZapine  (ZYPREXA ) 5 MG tablet Take 1 tablet (5 mg total) by mouth 2 (two) times daily.      OLANZapine  (ZYPREXA ) injection Inject 1 mL (5 mg total) into the muscle 2 (two) times daily.      thiamine  (VITAMIN B-1) 100 MG tablet Take 1 tablet (100 mg total) by mouth daily.      traZODone  (DESYREL ) 50 MG tablet Take 1 tablet (50 mg total) by mouth at bedtime.       Patient Stressors:    Patient Strengths:    Treatment Modalities: Medication Management, Group therapy, Case management,  1 to 1 session with clinician, Psychoeducation, Recreational therapy.   Physician Treatment Plan for Primary Diagnosis: <principal problem not specified> Long Term Goal(s): Improvement in symptoms so as ready for discharge   Short Term Goals: Ability to identify changes in lifestyle to reduce recurrence of condition will improve  Medication Management: Evaluate patient's response, side effects, and tolerance of medication regimen.  Therapeutic  Interventions: 1 to 1 sessions, Unit Group sessions and Medication administration.  Evaluation of Outcomes: Progressing  Physician Treatment Plan for Secondary Diagnosis: Active Problems:   Schizophrenia, paranoid (HCC)  Long Term Goal(s): Improvement in symptoms so as ready for discharge   Short Term Goals: Ability to identify changes in lifestyle to reduce recurrence of condition will improve     Medication Management: Evaluate patient's response, side effects, and tolerance of medication regimen.  Therapeutic Interventions: 1 to 1 sessions, Unit Group sessions and Medication administration.  Evaluation of Outcomes: Progressing   RN Treatment Plan for Primary Diagnosis: <principal problem not  specified> Long Term Goal(s): Knowledge of disease and therapeutic regimen to maintain health will improve  Short Term Goals: Ability to remain free from injury will improve, Ability to verbalize frustration and anger appropriately will improve, Ability to demonstrate self-control, Ability to participate in decision making will improve, Ability to verbalize feelings will improve, Ability to disclose and discuss suicidal ideas, Ability to identify and develop effective coping behaviors will improve, and Compliance with prescribed medications will improve  Medication Management: RN will administer medications as ordered by provider, will assess and evaluate patient's response and provide education to patient for prescribed medication. RN will report any adverse and/or side effects to prescribing provider.  Therapeutic Interventions: 1 on 1 counseling sessions, Psychoeducation, Medication administration, Evaluate responses to treatment, Monitor vital signs and CBGs as ordered, Perform/monitor CIWA, COWS, AIMS and Fall Risk screenings as ordered, Perform wound care treatments as ordered.  Evaluation of Outcomes: Progressing   LCSW Treatment Plan for Primary Diagnosis: <principal problem not specified> Long Term Goal(s): Safe transition to appropriate next level of care at discharge, Engage patient in therapeutic group addressing interpersonal concerns.  Short Term Goals: Engage patient in aftercare planning with referrals and resources, Increase social support, Increase ability to appropriately verbalize feelings, Increase emotional regulation, Facilitate acceptance of mental health diagnosis and concerns, Facilitate patient progression through stages of change regarding substance use diagnoses and concerns, Identify triggers associated with mental health/substance abuse issues, and Increase skills for wellness and recovery  Therapeutic Interventions: Assess for all discharge needs, 1 to 1 time with  Social worker, Explore available resources and support systems, Assess for adequacy in community support network, Educate family and significant other(s) on suicide prevention, Complete Psychosocial Assessment, Interpersonal group therapy.  Evaluation of Outcomes: Progressing   Progress in Treatment: Attending groups: No. Participating in groups: No. Taking medication as prescribed: Yes. Toleration medication: Yes. Family/Significant other contact made: Yes, individual(s) contacted:   contact has been made with DSS as well as patients brother. Patient understands diagnosis: No. Discussing patient identified problems/goals with staff: No. Medical problems stabilized or resolved: Yes. Denies suicidal/homicidal ideation: Yes. Issues/concerns per patient self-inventory: No. Other: none  New problem(s) identified: No, Describe:  none  New Short Term/Long Term Goal(s):elimination of symptoms of psychosis, medication management for mood stabilization; elimination of SI thoughts; development of comprehensive mental wellness plan. Update: 05/13/2024 No changes at this time.Update: 05/18/2024 No changes at this time. Update 05/23/2024:  No changes at this time. Update 05/28/2024:  No changes at this time. Update 06/02/2024:  No changes at this time. Update 06/07/2024:  No changes at this time. 06/12/24 Update: No changes at this time. Update 06/18/24:  No changes at this time  Update 06/24/24:  No changes at this time Update 06/29/24:  No changes at this time 07/04/24 Update: No changes. 07/09/24: No change 07/14/2024: No changes at this time.  Update 07/19/24: No  changes at this time Update 07/25/23: No changes at this time Update 07/29/24 no changes  Patient Goals:    I just need to leave, I don't need it hereUpdate: 05/13/2024 No changes at this time.Update: 05/18/2024 No changes at this time. Update 05/23/2024:  No changes at this time.Update 05/28/2024:  No changes at this time. Update 06/02/2024:   No changes at this time. Update 06/07/2024:  No changes at this time. 06/12/24 Update: No changes at this time. Update 06/18/24:  No changes at this time Update 06/24/24:  No changes at this time Update 06/29/24:  No changes at this time 07/04/24 Update: No changes. 07/09/24: No changes at this time.  07/14/2024: No changes at this time.   Update 07/19/24: No changes at this time Update 07/25/23: No changes at this time Update: 07/29/24  Discharge Plan or Barriers:  CSW will assist with appropriate discharge planning. Update: 05/13/2024 No changes at this time.Update: 05/18/2024 No changes at this time. Update 05/23/2024:  No changes at this time.Update 05/28/2024:  CSW unable to identify appropriate decision maker, as pt's legal guardian has passed and CSW has reached out to APS caseworker multiple times to get clarity on protocol for enforcing medication until an appropriate party has been legal identified Update 06/02/2024:  No changes at this time. Update 06/07/2024:  CSW and provider still working to clarify appropriate horticulturist, commercial. Please refer to extensive notes in medical record. 06/12/24 Update: CSW and provider still working with DSS to obtain legal decision maker on patient's behalf. Update 06/18/24:  No changes at this time Update 06/24/24:  DSS received ex-parte, DSS to begin looking for placement, Dr.J to initiate enforced medication Update 06/29/24:  No changes at this time 07/04/24 Update: CSW to continue to provide support to APS case worker while DSS works on clinical cytogeneticist. No changes. 07/09/24 07/14/2024: Possible bed at [facility], CSW team to follow up with guardian regarding this.   Update 07/19/24: No changes at this time  Update 07/25/23: No changes at this time   Update 07/29/24 No changes at this time  Reason for Continuation of Hospitalization: Aggression Hallucinations  Estimated Length of Stay:TBD  Last 3 [place] Suicide Severity Risk  Score: Flowsheet Row Admission (Current) from 05/06/2024 in [facility] ED to Hosp-Admission (Discharged) from 05/02/2024 in [facility] ED from 04/30/2024 in [facility]  C-SSRS RISK CATEGORY No Risk No Risk No Risk    Last [facility] 2/9 Scores:     No data to display          Scribe for Treatment Team: Pamila Nine, KEN 07/29/2024 1:46 PM

## 2024-07-29 NOTE — Group Note (Signed)
 Date:  07/29/2024 Time:  3:52 PM  Group Topic/Focus:  Managing Feelings:   The focus of this group is to identify what feelings patients have difficulty handling and develop a plan to handle them in a healthier way upon discharge.    Participation Level:  Did Not Attend   [name] 07/29/2024, 3:52 PM

## 2024-07-29 NOTE — Group Note (Signed)
 Date:  07/29/2024 Time:  10:47 AM  Group Topic/Focus:  Coping With Mental Health Crisis:   The purpose of this group is to help patients identify strategies for coping with mental health crisis.  Group discusses possible causes of crisis and ways to manage them effectively.    Participation Level:  Did Not Attend   [name] 07/29/2024, 10:47 AM

## 2024-07-29 NOTE — Group Note (Signed)
 Date:  07/29/2024 Time:  2:59 PM  Group Topic/Focus:  Movement Therapy    Participation Level:  Did Not Attend    [name] Bias 07/29/2024, 2:59 PM

## 2024-07-29 NOTE — Progress Notes (Signed)
" °   07/29/24 1000  Psych Admission Type (Psych Patients Only)  Admission Status Voluntary  Psychosocial Assessment  Patient Complaints None  Eye Contact Brief  Facial Expression Flat  Affect Flat  Speech Soft  Interaction Isolative  Motor Activity Slow  Appearance/Hygiene In scrubs  Behavior Characteristics Cooperative  Mood Pleasant  Thought Process  Coherency WDL  Content WDL  Delusions None reported or observed  Perception WDL  Hallucination None reported or observed  Judgment Poor  Confusion Mild  Danger to Self  Current suicidal ideation? Denies    "

## 2024-07-30 NOTE — Plan of Care (Signed)
   Problem: Education: Goal: Knowledge of [facility] Education information/materials will improve Outcome: Progressing Goal: Emotional status will improve Outcome: Progressing Goal: Mental status will improve Outcome: Progressing Goal: Verbalization of understanding the information provided will improve Outcome: Progressing   Problem: Activity: Goal: Interest or engagement in activities will improve Outcome: Progressing Goal: Sleeping patterns will improve Outcome: Progressing   Problem: Coping: Goal: Ability to verbalize frustrations and anger appropriately will improve Outcome: Progressing Goal: Ability to demonstrate self-control will improve Outcome: Progressing   Problem: Health Behavior/Discharge Planning: Goal: Identification of resources available to assist in meeting health care needs will improve Outcome: Progressing Goal: Compliance with treatment plan for underlying cause of condition will improve Outcome: Progressing

## 2024-07-30 NOTE — Group Note (Signed)
 Date:  07/30/2024 Time:  9:09 PM  Group Topic/Focus:  Wrap-Up Group:   The focus of this group is to help patients review their daily goal of treatment and discuss progress on daily workbooks.    Participation Level:  Did Not Attend  Participation Quality:     Affect:     Cognitive:     Insight: None  Engagement in Group:  None  Modes of Intervention:     Additional Comments:    [name] 07/30/2024, 9:09 PM

## 2024-07-30 NOTE — Plan of Care (Signed)
" °  Problem: Education: Goal: Emotional status will improve Outcome: Not Progressing Goal: Mental status will improve Outcome: Not Progressing Goal: Verbalization of understanding the information provided will improve Outcome: Not Progressing   Problem: Activity: Goal: Interest or engagement in activities will improve Outcome: Not Progressing Goal: Sleeping patterns will improve Outcome: Not Progressing   Problem: Coping: Goal: Ability to verbalize frustrations and anger appropriately will improve Outcome: Not Progressing Goal: Ability to demonstrate self-control will improve Outcome: Not Progressing   Problem: Health Behavior/Discharge Planning: Goal: Identification of resources available to assist in meeting health care needs will improve Outcome: Not Progressing Goal: Compliance with treatment plan for underlying cause of condition will improve Outcome: Not Progressing   Problem: Physical Regulation: Goal: Ability to maintain clinical measurements within normal limits will improve Outcome: Not Progressing   Problem: Safety: Goal: Periods of time without injury will increase Outcome: Not Progressing   "

## 2024-07-30 NOTE — Progress Notes (Signed)
 Phone call back [facility] MD Progress Note   1:11 PM [name]  MRN:  [id number]  [name] is a 64 y.o. female admitted: [facility] 05/02/2024 10:56 AM for dehydration, failure to thrive and hypoglycemia. She carries the psychiatric diagnoses of Schizophrenia and has medical hx of hypoglycemia and failure to thrive and has a past medical history of sinus bradycardia, chronic anemia, severe malnutrition and recurrent hypoglycemia.. .    Subjective:  Chart reviewed, case discussed in multidisciplinary meeting, patient seen during rounds.    Patient is noted to be sitting in her bed in her room.  When asked about any concerns or complaints she becomes very irritable stating she has nothing to say and that she wants to go home.  She refused to answer the questions related to SI/HI/hallucinations.  Per nursing patient remains calm, taking her medications with no problems, comes for all the groups.   Past Psychiatric History: see h&P Family History: History reviewed. No pertinent family history. Social History:  Social History   Substance and Sexual Activity  Alcohol Use Not Currently   Comment: Refuses to disclose how much     Social History   Substance and Sexual Activity  Drug Use No   Comment: Refuses to answer    Social History   Socioeconomic History   Marital status: Single    Spouse name: Not on file   Number of children: Not on file   Years of education: Not on file   Highest education level: Not on file  Occupational History   Not on file  Tobacco Use   Smoking status: Every Day    Current packs/day: 2.00    Types: Cigarettes    Passive exposure: Current   Smokeless tobacco: Never  Vaping Use   Vaping status: Never Used  Substance and Sexual Activity   Alcohol use: Not Currently    Comment: Refuses to disclose how much   Drug use: No    Comment: Refuses to answer   Sexual activity: Not Currently    Comment: refused to answer  Other Topics Concern   Not on  file  Social History Narrative   Not on file   Social Drivers of Health   Tobacco Use: High Risk (05/06/2024)   Patient History    Smoking Tobacco Use: Every Day    Smokeless Tobacco Use: Never    Passive Exposure: Current  Financial Resource Strain: Not on file  Food Insecurity: Patient Declined (05/06/2024)   Epic    Worried About Programme Researcher, Broadcasting/film/video in the Last Year: Patient declined    Barista in the Last Year: Patient declined  Transportation Needs: Patient Declined (05/06/2024)   Epic    Lack of Transportation (Medical): Patient declined    Lack of Transportation (Non-Medical): Patient declined  Physical Activity: Not on file  Stress: Not on file  Social Connections: Not on file  Depression (PHQ2-9): Not on file  Alcohol Screen: Low Risk (05/06/2024)   Alcohol Screen    Last Alcohol Screening Score (AUDIT): 0  Housing: Unknown (05/06/2024)   Epic    Unable to Pay for Housing in the Last Year: Patient declined    Number of Times Moved in the Last Year: Not on file    Homeless in the Last Year: Not on file  Utilities: Patient Declined (05/06/2024)   Epic    Threatened with loss of utilities: Patient declined  Health Literacy: Not on file   Past Medical History:  Past Medical History:  Diagnosis Date   Anemia 10/02/2021   Non compliance w medication regimen    Schizophrenia [facility])     Past Surgical History:  Procedure Laterality Date   BIOPSY  09/25/2021   Procedure: BIOPSY;  Surgeon: Teressa Toribio SQUIBB, MD;  Location: [facility];  Service: Gastroenterology;;   ESOPHAGOGASTRODUODENOSCOPY (EGD) WITH PROPOFOL  [name] 09/25/2021   Procedure: ESOPHAGOGASTRODUODENOSCOPY (EGD) WITH PROPOFOL ;  Surgeon: Teressa Toribio SQUIBB, MD;  Location: [facility];  Service: Gastroenterology;  Laterality: [name];  With NGT placement    Current Medications: Current Facility-Administered Medications  Medication Dose Route Frequency Provider Last Rate Last Admin   acetaminophen  (TYLENOL )  tablet 650 mg  650 mg Oral Q6H PRN Starkes-Perry, Takia S, FNP       alum & mag hydroxide-simeth (MAALOX/MYLANTA) 200-200-20 MG/5ML suspension 30 mL  30 mL Oral Q4H PRN Starkes-Perry, Takia S, FNP       haloperidol  (HALDOL ) tablet 5 mg  5 mg Oral BID Maytte Jacot, MD   5 mg at 07/30/24 9041   Or   haloperidol  lactate (HALDOL ) injection 10 mg  10 mg Intramuscular BID Ashtin Melichar, MD       HYDROcodone -acetaminophen  (NORCO/VICODIN) 5-325 MG per tablet 1-2 tablet  1-2 tablet Oral Q4H PRN Starkes-Perry, Takia S, FNP       magnesium  hydroxide (MILK OF MAGNESIA) suspension 30 mL  30 mL Oral Daily PRN Starkes-Perry, Takia S, FNP       multivitamin with minerals tablet 1 tablet  1 tablet Oral Daily Wilkie Majel RAMAN, FNP   1 tablet at 07/30/24 9042   OLANZapine  (ZYPREXA ) injection 5 mg  5 mg Intramuscular TID PRN Wilkie Majel RAMAN, FNP       OLANZapine  (ZYPREXA ) tablet 15 mg  15 mg Oral BID Anakin Varkey, MD   15 mg at 07/30/24 9041   OLANZapine  zydis (ZYPREXA ) disintegrating tablet 5 mg  5 mg Oral TID PRN Starkes-Perry, Takia S, FNP       ondansetron  (ZOFRAN ) tablet 4 mg  4 mg Oral Q6H PRN Starkes-Perry, Majel RAMAN, FNP       Or   ondansetron  (ZOFRAN ) injection 4 mg  4 mg Intravenous Q6H PRN Starkes-Perry, Majel RAMAN, FNP       thiamine  (VITAMIN B1) tablet 100 mg  100 mg Oral Daily Wilkie Majel RAMAN, FNP   100 mg at 07/30/24 0957   traZODone  (DESYREL ) tablet 50 mg  50 mg Oral QHS Starkes-Perry, Takia S, FNP   50 mg at 07/29/24 2109    Lab Results:  No results found for this or any previous visit (from the past 48 hours).   Blood Alcohol level:  Lab Results  Component Value Date   [facility] <15 05/02/2024   ETH <15 05/01/2024    Metabolic Disorder Labs: Lab Results  Component Value Date   HGBA1C 5.6 05/14/2024   MPG 114.02 05/14/2024   MPG 116.89 05/02/2024   Lab Results  Component Value Date   PROLACTIN 13.6 04/16/2015   Lab Results  Component Value Date   CHOL 219 (H)  05/14/2024   TRIG 84 05/14/2024   HDL 83 05/14/2024   CHOLHDL 2.6 05/14/2024   VLDL 17 05/14/2024   LDLCALC 119 (H) 05/14/2024   LDLCALC 130 (H) 05/01/2024    Physical Findings: AIMS:  , ,  ,  ,    CIWA:    COWS:      Psychiatric Specialty Exam:  Presentation  General Appearance:  Casual  Eye Contact: Fair  Speech: Normal rate  Speech Volume: Increased    Mood and Affect  Mood: Angry  Affect: Congruent   Thought Process  Thought Processes: Disorganized  Descriptions of Associations: Concrete Orientation:Partial  Thought Content: Discharge focused Hallucinations:RESPONDING TO INTERNAL STIMULI Ideas of Reference:Paranoia; Delusions; Percusatory  Suicidal Thoughts:not endorsing Homicidal Thoughts:nor endorsing  Sensorium  Memory: Recent Poor  Judgment: Poor  Insight: Poor   Executive Functions  Concentration: Poor  Attention Span: Poor  Recall: Poor  Fund of Knowledge: Poor  Language: Poor   Psychomotor Activity  Psychomotor Activity:No data recorded Musculoskeletal: Strength & Muscle Tone: within normal limits Gait & Station: normal Assets  Assets: Resilience    Physical Exam: Physical Exam Vitals and nursing note reviewed.    ROS Blood pressure 99/70, pulse (!) 58, temperature 97.6 F (36.4 C), resp. rate 14, height 5' 4 (1.626 m), weight 44.9 kg, SpO2 100%. Body mass index is 16.99 kg/m.  Diagnosis: Active Problems:   Schizophrenia, paranoid (HCC) Paranoid  PLAN: Safety and Monitoring:  -- Involuntary admission to inpatient psychiatric unit for safety, stabilization and treatment  -- Daily contact with patient to assess and evaluate symptoms and progress in treatment  -- Patient's case to be discussed in multi-disciplinary team meeting  -- Observation Level : q15 minute checks  -- Vital signs:  q12 hours  -- Precautions: suicide, elopement, and assault -- Encouraged patient to participate in unit  milieu and in scheduled group therapies  2. Psychiatric Diagnoses and Treatment: Patient  2 physicians approved non emergent forced medications legal guardian gave consent for Haldol  oral or IM  Zyprexa  15 mg BID  Haldol  10 mg bid added -  LAI haldol  Dec 100mg  Q4 weeks-07/26/2024 DSS gave consent-nonemergent forced medication consented by legal guardian  . Discharge Planning:   -- Social work and case management to assist with discharge planning and identification of hospital follow-up needs prior to discharge  -- Estimated LOS: 3-4 days  Allyn Foil, MD 07/30/2024, 1:11 PM

## 2024-07-30 NOTE — Group Note (Signed)
 Date:  07/30/2024 Time:  10:52 AM  Group Topic/Focus:  Self Care:   The focus of this group is to help patients understand the importance of self-care in order to improve or restore emotional, physical, spiritual, interpersonal, and financial health.    Participation Level:  Did Not Attend  Participation Quality:     Affect:     Cognitive:     Insight: None  Engagement in Group:  None  Modes of Intervention:     Additional Comments:    [name] 07/30/2024, 10:52 AM

## 2024-07-30 NOTE — Progress Notes (Signed)
 Patient accepted medications without issue. Minimal conversation but pleasant during exchange. SI/HI/AVH questions cut short. Patient present in dayroom for meals. No other engagement. Ongoing monitoring continues.   07/30/24 1100  Psych Admission Type (Psych Patients Only)  Admission Status Voluntary  Psychosocial Assessment  Patient Complaints None  Eye Contact Brief  Facial Expression Flat  Affect Appropriate to circumstance  Speech Soft  Interaction Isolative;Minimal  Motor Activity Slow  Appearance/Hygiene In scrubs  Behavior Characteristics Appropriate to situation;Calm  Mood Pleasant  Thought Process  Coherency WDL  Content WDL  Delusions None reported or observed  Perception WDL  Hallucination None reported or observed  Judgment Poor  Confusion Mild  Danger to Self  Current suicidal ideation? Denies  Danger to Others  Danger to Others None reported or observed

## 2024-07-30 NOTE — Group Note (Signed)
 [facility] LCSW Group Therapy Note   Group Date: 07/30/2024 Start Time: 1100 End Time: 1130  Type of Therapy and Topic:  Group Therapy:  Feelings around Relapse and Recovery  Participation Level:  Did Not Attend   Mood: Not able to assess.  Description of Group:    Patients in this group will discuss emotions they experience before and after a relapse. They will process how experiencing these feelings, or avoidance of experiencing them, relates to having a relapse. Facilitator will guide patients to explore emotions they have related to recovery. Patients will be encouraged to process which emotions are more powerful. They will be guided to discuss the emotional reaction significant others in their lives may have to patients relapse or recovery. Patients will be assisted in exploring ways to respond to the emotions of others without this contributing to a relapse.  Therapeutic Goals: Patient will identify two or more emotions that lead to relapse for them:  Patient will identify two emotions that result when they relapse:  Patient will identify two emotions related to recovery:  Patient will demonstrate ability to communicate their needs through discussion and/or role plays.   Summary of Patient Progress:  Pt did not attend group.   Therapeutic Modalities:   Cognitive Behavioral Therapy Solution-Focused Therapy Assertiveness Training Relapse Prevention Therapy   Rexene LELON Mae, LCSWA

## 2024-07-30 NOTE — Group Note (Signed)
 Date:  07/30/2024 Time:  3:14 PM  Group Topic/Focus:  House rules and unit expectations    Participation Level:  Did Not Attend    [name] Bias 07/30/2024, 3:14 PM

## 2024-07-31 NOTE — Progress Notes (Signed)
 Phone call back [facility] MD Progress Note   1:32 PM [name]  MRN:  [id number]  [name] is a 64 y.o. female admitted: Medicallyfor 05/02/2024 10:56 AM for dehydration, failure to thrive and hypoglycemia. She carries the psychiatric diagnoses of Schizophrenia and has medical hx of hypoglycemia and failure to thrive and has a past medical history of sinus bradycardia, chronic anemia, severe malnutrition and recurrent hypoglycemia.. .    Subjective:  Chart reviewed, case discussed in multidisciplinary meeting, patient seen during rounds.   Patient is known to be sitting in her room.  She remains superficially engaged and refuses to answer any questions related to mental health.  She denies being in pain and denies any discomfort.  Per nursing patient takes medications with no reported side effects.   Past Psychiatric History: see h&P Family History: History reviewed. No pertinent family history. Social History:  Social History   Substance and Sexual Activity  Alcohol Use Not Currently   Comment: Refuses to disclose how much     Social History   Substance and Sexual Activity  Drug Use No   Comment: Refuses to answer    Social History   Socioeconomic History   Marital status: Single    Spouse name: Not on file   Number of children: Not on file   Years of education: Not on file   Highest education level: Not on file  Occupational History   Not on file  Tobacco Use   Smoking status: Every Day    Current packs/day: 2.00    Types: Cigarettes    Passive exposure: Current   Smokeless tobacco: Never  Vaping Use   Vaping status: Never Used  Substance and Sexual Activity   Alcohol use: Not Currently    Comment: Refuses to disclose how much   Drug use: No    Comment: Refuses to answer   Sexual activity: Not Currently    Comment: refused to answer  Other Topics Concern   Not on file  Social History Narrative   Not on file   Social Drivers of Health   Tobacco Use: High  Risk (05/06/2024)   Patient History    Smoking Tobacco Use: Every Day    Smokeless Tobacco Use: Never    Passive Exposure: Current  Financial Resource Strain: Not on file  Food Insecurity: Patient Declined (05/06/2024)   Epic    Worried About Programme Researcher, Broadcasting/film/video in the Last Year: Patient declined    Barista in the Last Year: Patient declined  Transportation Needs: Patient Declined (05/06/2024)   Epic    Lack of Transportation (Medical): Patient declined    Lack of Transportation (Non-Medical): Patient declined  Physical Activity: Not on file  Stress: Not on file  Social Connections: Not on file  Depression (PHQ2-9): Not on file  Alcohol Screen: Low Risk (05/06/2024)   Alcohol Screen    Last Alcohol Screening Score (AUDIT): 0  Housing: Unknown (05/06/2024)   Epic    Unable to Pay for Housing in the Last Year: Patient declined    Number of Times Moved in the Last Year: Not on file    Homeless in the Last Year: Not on file  Utilities: Patient Declined (05/06/2024)   Epic    Threatened with loss of utilities: Patient declined  Health Literacy: Not on file   Past Medical History:  Past Medical History:  Diagnosis Date   Anemia 10/02/2021   Non compliance w medication regimen  Schizophrenia [facility])     Past Surgical History:  Procedure Laterality Date   BIOPSY  09/25/2021   Procedure: BIOPSY;  Surgeon: Teressa Toribio SQUIBB, MD;  Location: [facility];  Service: Gastroenterology;;   ESOPHAGOGASTRODUODENOSCOPY (EGD) WITH PROPOFOL  [name] 09/25/2021   Procedure: ESOPHAGOGASTRODUODENOSCOPY (EGD) WITH PROPOFOL ;  Surgeon: Teressa Toribio SQUIBB, MD;  Location: [facility];  Service: Gastroenterology;  Laterality: [name];  With NGT placement    Current Medications: Current Facility-Administered Medications  Medication Dose Route Frequency Provider Last Rate Last Admin   acetaminophen  (TYLENOL ) tablet 650 mg  650 mg Oral Q6H PRN Starkes-Perry, Takia S, FNP       alum & mag hydroxide-simeth  (MAALOX/MYLANTA) 200-200-20 MG/5ML suspension 30 mL  30 mL Oral Q4H PRN Starkes-Perry, Takia S, FNP       haloperidol  (HALDOL ) tablet 5 mg  5 mg Oral BID Keiona Jenison, MD   5 mg at 07/31/24 1001   Or   haloperidol  lactate (HALDOL ) injection 10 mg  10 mg Intramuscular BID Laporche Martelle, MD       HYDROcodone -acetaminophen  (NORCO/VICODIN) 5-325 MG per tablet 1-2 tablet  1-2 tablet Oral Q4H PRN Starkes-Perry, Takia S, FNP       magnesium  hydroxide (MILK OF MAGNESIA) suspension 30 mL  30 mL Oral Daily PRN Starkes-Perry, Takia S, FNP       multivitamin with minerals tablet 1 tablet  1 tablet Oral Daily Wilkie Majel RAMAN, FNP   1 tablet at 07/31/24 1002   OLANZapine  (ZYPREXA ) injection 5 mg  5 mg Intramuscular TID PRN Wilkie Majel RAMAN, FNP       OLANZapine  (ZYPREXA ) tablet 15 mg  15 mg Oral BID Maykel Reitter, MD   15 mg at 07/31/24 1000   OLANZapine  zydis (ZYPREXA ) disintegrating tablet 5 mg  5 mg Oral TID PRN Starkes-Perry, Takia S, FNP       ondansetron  (ZOFRAN ) tablet 4 mg  4 mg Oral Q6H PRN Starkes-Perry, Majel RAMAN, FNP       Or   ondansetron  (ZOFRAN ) injection 4 mg  4 mg Intravenous Q6H PRN Starkes-Perry, Majel RAMAN, FNP       thiamine  (VITAMIN B1) tablet 100 mg  100 mg Oral Daily Wilkie Majel RAMAN, FNP   100 mg at 07/31/24 1001   traZODone  (DESYREL ) tablet 50 mg  50 mg Oral QHS Starkes-Perry, Takia S, FNP   50 mg at 07/30/24 2114    Lab Results:  No results found for this or any previous visit (from the past 48 hours).   Blood Alcohol level:  Lab Results  Component Value Date   [facility] <15 05/02/2024   ETH <15 05/01/2024    Metabolic Disorder Labs: Lab Results  Component Value Date   HGBA1C 5.6 05/14/2024   MPG 114.02 05/14/2024   MPG 116.89 05/02/2024   Lab Results  Component Value Date   PROLACTIN 13.6 04/16/2015   Lab Results  Component Value Date   CHOL 219 (H) 05/14/2024   TRIG 84 05/14/2024   HDL 83 05/14/2024   CHOLHDL 2.6 05/14/2024   VLDL 17  05/14/2024   LDLCALC 119 (H) 05/14/2024   LDLCALC 130 (H) 05/01/2024    Physical Findings: AIMS:  , ,  ,  ,    CIWA:    COWS:      Psychiatric Specialty Exam:  Presentation  General Appearance:  Casual  Eye Contact: Fair  Speech: Normal rate  Speech Volume: Increased    Mood and Affect  Mood: Angry  Affect: Congruent  Thought Process  Thought Processes: Disorganized  Descriptions of Associations: Concrete Orientation:Partial  Thought Content: Discharge focused Hallucinations:RESPONDING TO INTERNAL STIMULI Ideas of Reference:Paranoia; Delusions; Percusatory  Suicidal Thoughts:not endorsing Homicidal Thoughts:nor endorsing  Sensorium  Memory: Recent Poor  Judgment: Poor  Insight: Poor   Executive Functions  Concentration: Poor  Attention Span: Poor  Recall: Poor  Fund of Knowledge: Poor  Language: Poor   Psychomotor Activity  Psychomotor Activity:No data recorded Musculoskeletal: Strength & Muscle Tone: within normal limits Gait & Station: normal Assets  Assets: Resilience    Physical Exam: Physical Exam Vitals and nursing note reviewed.    ROS Blood pressure (!) 99/58, pulse 67, temperature 97.6 F (36.4 C), resp. rate 16, height 5' 4 (1.626 m), weight 44.9 kg, SpO2 100%. Body mass index is 16.99 kg/m.  Diagnosis: Active Problems:   Schizophrenia, paranoid (HCC) Paranoid  PLAN: Safety and Monitoring:  -- Involuntary admission to inpatient psychiatric unit for safety, stabilization and treatment  -- Daily contact with patient to assess and evaluate symptoms and progress in treatment  -- Patient's case to be discussed in multi-disciplinary team meeting  -- Observation Level : q15 minute checks  -- Vital signs:  q12 hours  -- Precautions: suicide, elopement, and assault -- Encouraged patient to participate in unit milieu and in scheduled group therapies  2. Psychiatric Diagnoses and Treatment: Patient  2  physicians approved non emergent forced medications legal guardian gave consent for Haldol  oral or IM  Zyprexa  15 mg BID  Haldol  10 mg bid added -  LAI haldol  Dec 100mg  Q4 weeks-07/26/2024 DSS gave consent-nonemergent forced medication consented by legal guardian  . Discharge Planning:   -- Social work and case management to assist with discharge planning and identification of hospital follow-up needs prior to discharge  -- Estimated LOS: 3-4 days  Allyn Foil, MD 07/31/2024, 1:32 PM

## 2024-07-31 NOTE — Group Note (Signed)
 Date:  07/31/2024 Time:  10:51 AM  Group Topic/Focus:  Self Care:   The focus of this group is to help patients understand the importance of self-care in order to improve or restore emotional, physical, spiritual, interpersonal, and financial health.    Participation Level:  Did Not Attend  Participation Quality:    Affect:    Cognitive:    Insight:   Engagement in Group:    Modes of Intervention:    Additional Comments:    [name] 07/31/2024, 10:51 AM

## 2024-07-31 NOTE — Plan of Care (Signed)
" °  Problem: Health Behavior/Discharge Planning: Goal: Identification of resources available to assist in meeting health care needs will improve Outcome: Progressing Goal: Compliance with treatment plan for underlying cause of condition will improve Outcome: Progressing   Problem: Safety: Goal: Ability to remain free from injury will improve Outcome: Progressing   "

## 2024-07-31 NOTE — Progress Notes (Signed)
 Behavior:   Pleasant and cooperative.   Psych assessment:  Denies SI/HI.  Group attendance:  0/2   Present in the milieu prior to meal time (about an hour before dinner).     Medication/ PRNs: Compliant.  Pain: Denies.  15 min checks in place for safety.

## 2024-07-31 NOTE — Plan of Care (Signed)
  Problem: Activity: Goal: Interest or engagement in activities will improve Outcome: Progressing   Problem: Education: Goal: Emotional status will improve Outcome: Not Progressing

## 2024-07-31 NOTE — Plan of Care (Signed)
  Problem: Education: Goal: Mental status will improve Outcome: Progressing   

## 2024-07-31 NOTE — Group Note (Signed)
 Recreation Therapy Group Note   Group Topic:Stress Management  Group Date: 07/31/2024 Start Time: 1415 End Time: 1440 Facilitators: Celestia Jeoffrey BRAVO, LRT, CTRS Location: Dayroom  Group Description: PMR (Progressive Muscle Relaxation). LRT educates patients on what PMR is and the benefits that come from it. Patients are asked to sit with their feet flat on the floor while sitting up and all the way back in their chair, if possible. LRT and pts follow a prompt through a speaker that requires you to tense and release different muscles in their body and focus on their breathing. During session, lights are off and soft music is being played. Pts are given a stress ball to use if needed.   Goal Area(s) Addressed:  Patients will be able to describe progressive muscle relaxation.  Patient will practice using relaxation technique. Patient will identify a new coping skill.  Patient will follow multistep directions to reduce anxiety and stress.   Affect/Mood: [name]   Participation Level: Did not attend    Clinical Observations/Individualized Feedback: Patient did not attend.  Plan: Continue to engage patient in RT group sessions 2-3x/week.   Jeoffrey BRAVO Celestia, LRT, CTRS 07/31/2024 4:35 PM

## 2024-08-01 NOTE — Group Note (Signed)
 Date:  08/01/2024 Time:  10:59 AM  Group Topic/Focus:  Orientation:   The focus of this group is to educate the patient on the purpose and policies of crisis stabilization and provide a format to answer questions about their admission.  The group details unit policies and expectations of patients while admitted. Wellness Toolbox:   The focus of this group is to discuss various aspects of wellness, balancing those aspects and exploring ways to increase the ability to experience wellness.  Patients will create a wellness toolbox for use upon discharge.    Participation Level:  Did Not Attend   Maglione,Shawanda Sievert E 08/01/2024, 10:59 AM

## 2024-08-01 NOTE — Group Note (Signed)
 Recreation Therapy Group Note   Group Topic:Leisure Education  Group Date: 08/01/2024 Start Time: 1415 End Time: 1500 Facilitators: Celestia Jeoffrey BRAVO, LRT, CTRS Location: Dayroom  Group Description: General Recreation. Patients were given the opportunity to play cards, journal, or color mandalas during group. Pt identified and conversated about things they enjoy doing in their free time and how they can continue to do that outside of the hospital.  Goal Area(s) Addressed: Patient will practice making a positive decision. Patient will have the opportunity to try a new leisure activity. Patient will communicate with peers and LRT.    Affect/Mood: [name]   Participation Level: Did not attend    Clinical Observations/Individualized Feedback: Patient did not attend.  Plan: Continue to engage patient in RT group sessions 2-3x/week.   Jeoffrey BRAVO Celestia, LRT, CTRS 08/01/2024 4:42 PM

## 2024-08-01 NOTE — Progress Notes (Signed)
 Phone call back [facility] MD Progress Note   3:43 PM [name]  MRN:  [id number]  [name] is a 64 y.o. female admitted: Medicallyfor 05/02/2024 10:56 AM for dehydration, failure to thrive and hypoglycemia. She carries the psychiatric diagnoses of Schizophrenia and has medical hx of hypoglycemia and failure to thrive and has a past medical history of sinus bradycardia, chronic anemia, severe malnutrition and recurrent hypoglycemia.. .    Subjective:  Chart reviewed, case discussed in multidisciplinary meeting, patient seen during rounds.    Patient is noted to be sitting in her bed.  She remains minimally engaged in the interview and offers no.  When provider asked about her mental health wellness she makes statements of needing to discharge and needs nothing from this provider other than discharge.  Per nursing staff patient is attending to her ADLs, coming out for groups and meals, and sitting in the day area for some time  Past Psychiatric History: see h&P Family History: History reviewed. No pertinent family history. Social History:  Social History   Substance and Sexual Activity  Alcohol Use Not Currently   Comment: Refuses to disclose how much     Social History   Substance and Sexual Activity  Drug Use No   Comment: Refuses to answer    Social History   Socioeconomic History   Marital status: Single    Spouse name: Not on file   Number of children: Not on file   Years of education: Not on file   Highest education level: Not on file  Occupational History   Not on file  Tobacco Use   Smoking status: Every Day    Current packs/day: 2.00    Types: Cigarettes    Passive exposure: Current   Smokeless tobacco: Never  Vaping Use   Vaping status: Never Used  Substance and Sexual Activity   Alcohol use: Not Currently    Comment: Refuses to disclose how much   Drug use: No    Comment: Refuses to answer   Sexual activity: Not Currently    Comment: refused to answer   Other Topics Concern   Not on file  Social History Narrative   Not on file   Social Drivers of Health   Tobacco Use: High Risk (05/06/2024)   Patient History    Smoking Tobacco Use: Every Day    Smokeless Tobacco Use: Never    Passive Exposure: Current  Financial Resource Strain: Not on file  Food Insecurity: Patient Declined (05/06/2024)   Epic    Worried About Programme Researcher, Broadcasting/film/video in the Last Year: Patient declined    Barista in the Last Year: Patient declined  Transportation Needs: Patient Declined (05/06/2024)   Epic    Lack of Transportation (Medical): Patient declined    Lack of Transportation (Non-Medical): Patient declined  Physical Activity: Not on file  Stress: Not on file  Social Connections: Not on file  Depression (PHQ2-9): Not on file  Alcohol Screen: Low Risk (05/06/2024)   Alcohol Screen    Last Alcohol Screening Score (AUDIT): 0  Housing: Unknown (05/06/2024)   Epic    Unable to Pay for Housing in the Last Year: Patient declined    Number of Times Moved in the Last Year: Not on file    Homeless in the Last Year: Not on file  Utilities: Patient Declined (05/06/2024)   Epic    Threatened with loss of utilities: Patient declined  Health Literacy: Not on file  Past Medical History:  Past Medical History:  Diagnosis Date   Anemia 10/02/2021   Non compliance w medication regimen    Schizophrenia [facility])     Past Surgical History:  Procedure Laterality Date   BIOPSY  09/25/2021   Procedure: BIOPSY;  Surgeon: Teressa Toribio SQUIBB, MD;  Location: [facility];  Service: Gastroenterology;;   ESOPHAGOGASTRODUODENOSCOPY (EGD) WITH PROPOFOL  [name] 09/25/2021   Procedure: ESOPHAGOGASTRODUODENOSCOPY (EGD) WITH PROPOFOL ;  Surgeon: Teressa Toribio SQUIBB, MD;  Location: [facility];  Service: Gastroenterology;  Laterality: [name];  With NGT placement    Current Medications: Current Facility-Administered Medications  Medication Dose Route Frequency Provider Last Rate Last  Admin   acetaminophen  (TYLENOL ) tablet 650 mg  650 mg Oral Q6H PRN Starkes-Perry, Takia S, FNP       alum & mag hydroxide-simeth (MAALOX/MYLANTA) 200-200-20 MG/5ML suspension 30 mL  30 mL Oral Q4H PRN Starkes-Perry, Takia S, FNP       haloperidol  (HALDOL ) tablet 5 mg  5 mg Oral BID Coyle Stordahl, MD   5 mg at 08/01/24 1051   Or   haloperidol  lactate (HALDOL ) injection 10 mg  10 mg Intramuscular BID Seng Fouts, MD       HYDROcodone -acetaminophen  (NORCO/VICODIN) 5-325 MG per tablet 1-2 tablet  1-2 tablet Oral Q4H PRN Starkes-Perry, Takia S, FNP       magnesium  hydroxide (MILK OF MAGNESIA) suspension 30 mL  30 mL Oral Daily PRN Starkes-Perry, Takia S, FNP       multivitamin with minerals tablet 1 tablet  1 tablet Oral Daily Wilkie Majel RAMAN, FNP   1 tablet at 08/01/24 1051   OLANZapine  (ZYPREXA ) injection 5 mg  5 mg Intramuscular TID PRN Wilkie Majel RAMAN, FNP       OLANZapine  (ZYPREXA ) tablet 15 mg  15 mg Oral BID Arling Cerone, MD   15 mg at 08/01/24 1051   OLANZapine  zydis (ZYPREXA ) disintegrating tablet 5 mg  5 mg Oral TID PRN Starkes-Perry, Takia S, FNP       ondansetron  (ZOFRAN ) tablet 4 mg  4 mg Oral Q6H PRN Starkes-Perry, Majel RAMAN, FNP       Or   ondansetron  (ZOFRAN ) injection 4 mg  4 mg Intravenous Q6H PRN Starkes-Perry, Majel RAMAN, FNP       thiamine  (VITAMIN B1) tablet 100 mg  100 mg Oral Daily Wilkie Majel RAMAN, FNP   100 mg at 08/01/24 1051   traZODone  (DESYREL ) tablet 50 mg  50 mg Oral QHS Starkes-Perry, Takia S, FNP   50 mg at 07/31/24 2125    Lab Results:  No results found for this or any previous visit (from the past 48 hours).   Blood Alcohol level:  Lab Results  Component Value Date   [facility] <15 05/02/2024   ETH <15 05/01/2024    Metabolic Disorder Labs: Lab Results  Component Value Date   HGBA1C 5.6 05/14/2024   MPG 114.02 05/14/2024   MPG 116.89 05/02/2024   Lab Results  Component Value Date   PROLACTIN 13.6 04/16/2015   Lab Results   Component Value Date   CHOL 219 (H) 05/14/2024   TRIG 84 05/14/2024   HDL 83 05/14/2024   CHOLHDL 2.6 05/14/2024   VLDL 17 05/14/2024   LDLCALC 119 (H) 05/14/2024   LDLCALC 130 (H) 05/01/2024    Physical Findings: AIMS:  , ,  ,  ,    CIWA:    COWS:      Psychiatric Specialty Exam:  Presentation  General Appearance:  Casual  Eye Contact: Fair  Speech: Normal rate  Speech Volume: Increased    Mood and Affect  Mood: Angry  Affect: Congruent   Thought Process  Thought Processes: Disorganized  Descriptions of Associations: Concrete Orientation:Partial  Thought Content: Discharge focused Hallucinations:RESPONDING TO INTERNAL STIMULI Ideas of Reference:Paranoia; Delusions; Percusatory  Suicidal Thoughts:not endorsing Homicidal Thoughts:nor endorsing  Sensorium  Memory: Recent Poor  Judgment: Poor  Insight: Poor   Executive Functions  Concentration: Poor  Attention Span: Poor  Recall: Poor  Fund of Knowledge: Poor  Language: Poor   Psychomotor Activity  Psychomotor Activity:No data recorded Musculoskeletal: Strength & Muscle Tone: within normal limits Gait & Station: normal Assets  Assets: Resilience    Physical Exam: Physical Exam Vitals and nursing note reviewed.    ROS Blood pressure 107/66, pulse 71, temperature 98.3 F (36.8 C), resp. rate 16, height 5' 4 (1.626 m), weight 44.9 kg, SpO2 100%. Body mass index is 16.99 kg/m.  Diagnosis: Active Problems:   Schizophrenia, paranoid (HCC) Paranoid  PLAN: Safety and Monitoring:  -- Involuntary admission to inpatient psychiatric unit for safety, stabilization and treatment  -- Daily contact with patient to assess and evaluate symptoms and progress in treatment  -- Patient's case to be discussed in multi-disciplinary team meeting  -- Observation Level : q15 minute checks  -- Vital signs:  q12 hours  -- Precautions: suicide, elopement, and assault -- Encouraged  patient to participate in unit milieu and in scheduled group therapies  2. Psychiatric Diagnoses and Treatment: Patient  2 physicians approved non emergent forced medications legal guardian gave consent for Haldol  oral or IM  Zyprexa  15 mg BID  Haldol  10 mg bid added -  LAI haldol  Dec 100mg  Q4 weeks-07/26/2024 DSS gave consent-nonemergent forced medication consented by legal guardian  . Discharge Planning:   -- Social work and case management to assist with discharge planning and identification of hospital follow-up needs prior to discharge  -- Estimated LOS: 3-4 days  Hershey Knauer, MD 08/01/2024, 3:43 PM

## 2024-08-01 NOTE — Group Note (Signed)
 Date:  08/01/2024 Time:  9:11 PM  Group Topic/Focus:  Wrap-Up Group:   The focus of this group is to help patients review their daily goal of treatment and discuss progress on daily workbooks.    Participation Level:  Did Not Attend  Participation Quality:     Affect:     Cognitive:     Insight: None  Engagement in Group:  None  Modes of Intervention:     Additional Comments:    [name] [name] 08/01/2024, 9:11 PM

## 2024-08-01 NOTE — Progress Notes (Signed)
(  Sleep Hours) - 9.50 (Any PRNs that were needed, meds refused, or side effects to meds)- None needed or requested (Any disturbances and when (visitation, over night)-None reported or observed (Concerns raised by the patient)- None reported or observed (SI/HI/AVH)- Denies

## 2024-08-01 NOTE — Group Note (Signed)
 [facility] LCSW Group Therapy Note   Group Date: 08/01/2024 Start Time: 1330 End Time: 1400  Type of Therapy and Topic:  Group Therapy:  Feelings around Relapse and Recovery  Participation Level:  Did Not Attend   Mood:  Description of Group:    Patients in this group will discuss emotions they experience before and after a relapse. They will process how experiencing these feelings, or avoidance of experiencing them, relates to having a relapse. Facilitator will guide patients to explore emotions they have related to recovery. Patients will be encouraged to process which emotions are more powerful. They will be guided to discuss the emotional reaction significant others in their lives may have to patients relapse or recovery. Patients will be assisted in exploring ways to respond to the emotions of others without this contributing to a relapse.  Therapeutic Goals: Patient will identify two or more emotions that lead to relapse for them:  Patient will identify two emotions that result when they relapse:  Patient will identify two emotions related to recovery:  Patient will demonstrate ability to communicate their needs through discussion and/or role plays.   Summary of Patient Progress: X   Therapeutic Modalities:   Cognitive Behavioral Therapy Solution-Focused Therapy Assertiveness Training Relapse Prevention Therapy   [name], LCSWA

## 2024-08-01 NOTE — Plan of Care (Signed)
  Problem: Coping: Goal: Ability to verbalize frustrations and anger appropriately will improve Outcome: Progressing Goal: Ability to demonstrate self-control will improve Outcome: Progressing   Problem: Health Behavior/Discharge Planning: Goal: Identification of resources available to assist in meeting health care needs will improve Outcome: Progressing Goal: Compliance with treatment plan for underlying cause of condition will improve Outcome: Progressing

## 2024-08-02 NOTE — Plan of Care (Signed)
  Problem: Activity: Goal: Sleeping patterns will improve Outcome: Progressing   Problem: Activity: Goal: Interest or engagement in activities will improve Outcome: Not Progressing   

## 2024-08-02 NOTE — Progress Notes (Signed)
" °   08/01/24 2000  Psych Admission Type (Psych Patients Only)  Admission Status Voluntary  Psychosocial Assessment  Patient Complaints None  Eye Contact Avoids  Facial Expression Flat  Affect Appropriate to circumstance  Speech Soft  Interaction Minimal  Motor Activity Other (Comment) (steady)  Appearance/Hygiene In scrubs  Behavior Characteristics Appropriate to situation;Cooperative;Calm  Mood Pleasant  Thought Process  Coherency WDL  Content WDL  Delusions None reported or observed  Perception WDL  Hallucination None reported or observed  Judgment Poor  Confusion Mild  Danger to Self  Current suicidal ideation? Denies  Danger to Others  Danger to Others None reported or observed   Mood/Behavior: Calm and cooperative.   Psych assessment: Denies SI/HI and AVH.     Interaction / Group attendance: Isolative to self. Minimal interaction with peers and staff.  Did not attend group.   Medication/ PRNs: Compliant with scheduled meds. No prns required.     Pain: Denies     15 min checks in place for safety. "

## 2024-08-02 NOTE — Group Note (Signed)
 Date:  08/02/2024 Time:  9:40 PM  Group Topic/Focus:  Wrap-Up Group:   The focus of this group is to help patients review their daily goal of treatment and discuss progress on daily workbooks.    Participation Level:  Did Not Attend  Participation Quality:     Affect:     Cognitive:     Insight: None  Engagement in Group:     Modes of Intervention:     Additional Comments:    Shellie Rogoff C Neda Willenbring 08/02/2024, 9:40 PM

## 2024-08-02 NOTE — [facility] Counselor (Signed)
 CSW sent an email to Etha Chang, APS caseworker to check on placement updates for pt.   CSW awaits updates at this time.

## 2024-08-02 NOTE — Plan of Care (Signed)
  Problem: Education: Goal: Knowledge of [place] [facility] Education information/materials will improve Outcome: Progressing Goal: Emotional status will improve Outcome: Progressing Goal: Mental status will improve Outcome: Progressing Goal: Verbalization of understanding the information provided will improve Outcome: Progressing   Problem: Activity: Goal: Interest or engagement in activities will improve Outcome: Progressing Goal: Sleeping patterns will improve Outcome: Progressing   Problem: Coping: Goal: Ability to verbalize frustrations and anger appropriately will improve Outcome: Progressing Goal: Ability to demonstrate self-control will improve Outcome: Progressing   Problem: Health Behavior/Discharge Planning: Goal: Identification of resources available to assist in meeting health care needs will improve Outcome: Progressing Goal: Compliance with treatment plan for underlying cause of condition will improve Outcome: Progressing   Problem: Physical Regulation: Goal: Ability to maintain clinical measurements within normal limits will improve Outcome: Progressing   Problem: Safety: Goal: Periods of time without injury will increase Outcome: Progressing   Problem: Education: Goal: Knowledge of General Education information will improve Description: Including pain rating scale, medication(s)/side effects and non-pharmacologic comfort measures Outcome: Progressing   Problem: Health Behavior/Discharge Planning: Goal: Ability to manage health-related needs will improve Outcome: Progressing   Problem: Clinical Measurements: Goal: Ability to maintain clinical measurements within normal limits will improve Outcome: Progressing Goal: Will remain free from infection Outcome: Progressing Goal: Diagnostic test results will improve Outcome: Progressing Goal: Respiratory complications will improve Outcome: Progressing Goal: Cardiovascular complication will be  avoided Outcome: Progressing   Problem: Activity: Goal: Risk for activity intolerance will decrease Outcome: Progressing   Problem: Nutrition: Goal: Adequate nutrition will be maintained Outcome: Progressing   Problem: Coping: Goal: Level of anxiety will decrease Outcome: Progressing   Problem: Elimination: Goal: Will not experience complications related to bowel motility Outcome: Progressing Goal: Will not experience complications related to urinary retention Outcome: Progressing   Problem: Pain Managment: Goal: General experience of comfort will improve and/or be controlled Outcome: Progressing   Problem: Safety: Goal: Ability to remain free from injury will improve Outcome: Progressing   Problem: Skin Integrity: Goal: Risk for impaired skin integrity will decrease Outcome: Progressing   Problem: Health Behavior/Discharge Planning: Goal: Compliance with prescribed medication regimen will improve Outcome: Progressing   Problem: Nutritional: Goal: Ability to achieve adequate nutritional intake will improve Outcome: Progressing

## 2024-08-02 NOTE — Progress Notes (Signed)
 SI/HI/AVH: denies all  Behavior/Mood: appropriate/pleasant    Interaction/Group attendance: isolative/0 of 3 groups    Medication/PRNs: compliant/none    Pain: denies  Other: discharge planning ongoing

## 2024-08-02 NOTE — Progress Notes (Signed)
 Phone call back [facility] MD Progress Note   8:34 PM [name]  MRN:  [id number]  [name] is a 64 y.o. female admitted: [facility] 05/02/2024 10:56 AM for dehydration, failure to thrive and hypoglycemia. She carries the psychiatric diagnoses of Schizophrenia and has medical hx of hypoglycemia and failure to thrive and has a past medical history of sinus bradycardia, chronic anemia, severe malnutrition and recurrent hypoglycemia.. .    Subjective:  Chart reviewed, case discussed in multidisciplinary meeting, patient seen during rounds.    Patient is noted to be standing in her room.  She remains minimally engaged in any interview.  She declines to answer questions related to SI/HI/plan and denies hallucinations.  Patient reports fair appetite and sleep.  Patient offers no physical complaints.  Per nursing staff patient is taking her medications with no reported side effects Past Psychiatric History: see h&P Family History: History reviewed. No pertinent family history. Social History:  Social History   Substance and Sexual Activity  Alcohol Use Not Currently   Comment: Refuses to disclose how much     Social History   Substance and Sexual Activity  Drug Use No   Comment: Refuses to answer    Social History   Socioeconomic History   Marital status: Single    Spouse name: Not on file   Number of children: Not on file   Years of education: Not on file   Highest education level: Not on file  Occupational History   Not on file  Tobacco Use   Smoking status: Every Day    Current packs/day: 2.00    Types: Cigarettes    Passive exposure: Current   Smokeless tobacco: Never  Vaping Use   Vaping status: Never Used  Substance and Sexual Activity   Alcohol use: Not Currently    Comment: Refuses to disclose how much   Drug use: No    Comment: Refuses to answer   Sexual activity: Not Currently    Comment: refused to answer  Other Topics Concern   Not on file  Social History  Narrative   Not on file   Social Drivers of Health   Tobacco Use: High Risk (05/06/2024)   Patient History    Smoking Tobacco Use: Every Day    Smokeless Tobacco Use: Never    Passive Exposure: Current  Financial Resource Strain: Not on file  Food Insecurity: Patient Declined (05/06/2024)   Epic    Worried About Programme Researcher, Broadcasting/film/video in the Last Year: Patient declined    Barista in the Last Year: Patient declined  Transportation Needs: Patient Declined (05/06/2024)   Epic    Lack of Transportation (Medical): Patient declined    Lack of Transportation (Non-Medical): Patient declined  Physical Activity: Not on file  Stress: Not on file  Social Connections: Not on file  Depression (PHQ2-9): Not on file  Alcohol Screen: Low Risk (05/06/2024)   Alcohol Screen    Last Alcohol Screening Score (AUDIT): 0  Housing: Unknown (05/06/2024)   Epic    Unable to Pay for Housing in the Last Year: Patient declined    Number of Times Moved in the Last Year: Not on file    Homeless in the Last Year: Not on file  Utilities: Patient Declined (05/06/2024)   Epic    Threatened with loss of utilities: Patient declined  Health Literacy: Not on file   Past Medical History:  Past Medical History:  Diagnosis Date   Anemia 10/02/2021  Non compliance w medication regimen    Schizophrenia [facility])     Past Surgical History:  Procedure Laterality Date   BIOPSY  09/25/2021   Procedure: BIOPSY;  Surgeon: Teressa Toribio SQUIBB, MD;  Location: [facility];  Service: Gastroenterology;;   ESOPHAGOGASTRODUODENOSCOPY (EGD) WITH PROPOFOL  [name] 09/25/2021   Procedure: ESOPHAGOGASTRODUODENOSCOPY (EGD) WITH PROPOFOL ;  Surgeon: Teressa Toribio SQUIBB, MD;  Location: [facility];  Service: Gastroenterology;  Laterality: [name];  With NGT placement    Current Medications: Current Facility-Administered Medications  Medication Dose Route Frequency Provider Last Rate Last Admin   acetaminophen  (TYLENOL ) tablet 650 mg  650 mg  Oral Q6H PRN Starkes-Perry, Takia S, FNP       alum & mag hydroxide-simeth (MAALOX/MYLANTA) 200-200-20 MG/5ML suspension 30 mL  30 mL Oral Q4H PRN Starkes-Perry, Takia S, FNP       haloperidol  (HALDOL ) tablet 5 mg  5 mg Oral BID Preslyn Warr, MD   5 mg at 08/02/24 9079   Or   haloperidol  lactate (HALDOL ) injection 10 mg  10 mg Intramuscular BID Jessica Checketts, MD       HYDROcodone -acetaminophen  (NORCO/VICODIN) 5-325 MG per tablet 1-2 tablet  1-2 tablet Oral Q4H PRN Starkes-Perry, Takia S, FNP       magnesium  hydroxide (MILK OF MAGNESIA) suspension 30 mL  30 mL Oral Daily PRN Starkes-Perry, Takia S, FNP       multivitamin with minerals tablet 1 tablet  1 tablet Oral Daily Wilkie Majel RAMAN, FNP   1 tablet at 08/02/24 0920   OLANZapine  (ZYPREXA ) injection 5 mg  5 mg Intramuscular TID PRN Wilkie Majel RAMAN, FNP       OLANZapine  (ZYPREXA ) tablet 15 mg  15 mg Oral BID Elby Blackwelder, MD   15 mg at 08/02/24 0920   OLANZapine  zydis (ZYPREXA ) disintegrating tablet 5 mg  5 mg Oral TID PRN Starkes-Perry, Takia S, FNP       ondansetron  (ZOFRAN ) tablet 4 mg  4 mg Oral Q6H PRN Starkes-Perry, Majel RAMAN, FNP       Or   ondansetron  (ZOFRAN ) injection 4 mg  4 mg Intravenous Q6H PRN Starkes-Perry, Majel RAMAN, FNP       thiamine  (VITAMIN B1) tablet 100 mg  100 mg Oral Daily Wilkie Majel RAMAN, FNP   100 mg at 08/02/24 0920   traZODone  (DESYREL ) tablet 50 mg  50 mg Oral QHS Starkes-Perry, Takia S, FNP   50 mg at 08/01/24 2120    Lab Results:  No results found for this or any previous visit (from the past 48 hours).   Blood Alcohol level:  Lab Results  Component Value Date   [facility] <15 05/02/2024   ETH <15 05/01/2024    Metabolic Disorder Labs: Lab Results  Component Value Date   HGBA1C 5.6 05/14/2024   MPG 114.02 05/14/2024   MPG 116.89 05/02/2024   Lab Results  Component Value Date   PROLACTIN 13.6 04/16/2015   Lab Results  Component Value Date   CHOL 219 (H) 05/14/2024   TRIG 84  05/14/2024   HDL 83 05/14/2024   CHOLHDL 2.6 05/14/2024   VLDL 17 05/14/2024   LDLCALC 119 (H) 05/14/2024   LDLCALC 130 (H) 05/01/2024    Physical Findings: AIMS:  , ,  ,  ,    CIWA:    COWS:      Psychiatric Specialty Exam:  Presentation  General Appearance:  Casual  Eye Contact: Fair  Speech: Normal rate  Speech Volume: Increased    Mood  and Affect  Mood: Angry  Affect: Congruent   Thought Process  Thought Processes: Disorganized  Descriptions of Associations: Concrete Orientation:Partial  Thought Content: Discharge focused Hallucinations:RESPONDING TO INTERNAL STIMULI Ideas of Reference:Paranoia; Delusions; Percusatory  Suicidal Thoughts:not endorsing Homicidal Thoughts:nor endorsing  Sensorium  Memory: Recent Poor  Judgment: Poor  Insight: Poor   Executive Functions  Concentration: Poor  Attention Span: Poor  Recall: Poor  Fund of Knowledge: Poor  Language: Poor   Psychomotor Activity  Psychomotor Activity:No data recorded Musculoskeletal: Strength & Muscle Tone: within normal limits Gait & Station: normal Assets  Assets: Resilience    Physical Exam: Physical Exam Vitals and nursing note reviewed.    ROS Blood pressure 110/61, pulse 64, temperature 98.6 F (37 C), temperature source Oral, resp. rate 18, height 5' 4 (1.626 m), weight 44.9 kg, SpO2 100%. Body mass index is 16.99 kg/m.  Diagnosis: Active Problems:   Schizophrenia, paranoid (HCC) Paranoid  PLAN: Safety and Monitoring:  -- Involuntary admission to inpatient psychiatric unit for safety, stabilization and treatment  -- Daily contact with patient to assess and evaluate symptoms and progress in treatment  -- Patient's case to be discussed in multi-disciplinary team meeting  -- Observation Level : q15 minute checks  -- Vital signs:  q12 hours  -- Precautions: suicide, elopement, and assault -- Encouraged patient to participate in unit milieu  and in scheduled group therapies  2. Psychiatric Diagnoses and Treatment: Patient  2 physicians approved non emergent forced medications legal guardian gave consent for Haldol  oral or IM  Zyprexa  15 mg BID  Haldol  10 mg bid added -  LAI haldol  Dec 100mg  Q4 weeks-07/26/2024 DSS gave consent-nonemergent forced medication consented by legal guardian  . Discharge Planning:   -- Social work and case management to assist with discharge planning and identification of hospital follow-up needs prior to discharge  -- Estimated LOS: 3-4 days  Allyn Foil, MD 08/02/2024, 8:34 PM

## 2024-08-02 NOTE — Group Note (Signed)
 Date:  08/02/2024 Time:  3:57 PM  Group Topic/Focus:  Goals Group:   The focus of this group is to help patients establish daily goals to achieve during treatment and discuss how the patient can incorporate goal setting into their daily lives to aide in recovery.    Participation Level:  Did Not Attend  [name] 08/02/2024, 3:57 PM

## 2024-08-02 NOTE — Group Note (Signed)
 Date:  08/02/2024 Time:  10:20 AM  Group Topic/Focus:  Orientation:   The focus of this group is to educate the patient on the purpose and policies of crisis stabilization and provide a format to answer questions about their admission.  The group details unit policies and expectations of patients while admitted.  Effective geriatric exercises focus on aerobic activity, strength, balance, and flexibility, including brisk walking, swimming, chair yoga, tai chi, using resistance bands, and simple bodyweight moves (like chair squats, wall push-ups) to boost heart health, prevent falls, maintain independence, and improve overall well-being, aiming for 150 mins of moderate aerobic activity and 2+ days of strength training weekly.   Participation Level:  Did Not Attend   Harlene LITTIE Gavel 08/02/2024, 10:20 AM

## 2024-08-03 NOTE — Progress Notes (Signed)
 Patient pleasant and cooperative during interaction. Short with responses. Unable to determine SI/HI/AVH. Patient accepted medications without issue. Ongoing monitoring continues  08/03/24 1100  Psych Admission Type (Psych Patients Only)  Admission Status Voluntary  Psychosocial Assessment  Patient Complaints None  Eye Contact Brief  Facial Expression Flat  Affect Appropriate to circumstance  Speech Soft  Interaction Isolative  Motor Activity Slow (sitting on bed)  Appearance/Hygiene In scrubs  Behavior Characteristics Appropriate to situation  Mood Pleasant  Thought Process  Coherency WDL  Content WDL  Delusions None reported or observed  Perception WDL  Hallucination None reported or observed  Judgment Poor  Confusion Mild  Danger to Self  Current suicidal ideation? Denies  Danger to Others  Danger to Others None reported or observed

## 2024-08-03 NOTE — Plan of Care (Signed)
  Problem: Education: Goal: Knowledge of [place] [facility] Education information/materials will improve Outcome: Progressing Goal: Emotional status will improve Outcome: Progressing Goal: Mental status will improve Outcome: Progressing Goal: Verbalization of understanding the information provided will improve Outcome: Progressing   Problem: Activity: Goal: Interest or engagement in activities will improve Outcome: Progressing Goal: Sleeping patterns will improve Outcome: Progressing   Problem: Coping: Goal: Ability to verbalize frustrations and anger appropriately will improve Outcome: Progressing Goal: Ability to demonstrate self-control will improve Outcome: Progressing   Problem: Health Behavior/Discharge Planning: Goal: Identification of resources available to assist in meeting health care needs will improve Outcome: Progressing Goal: Compliance with treatment plan for underlying cause of condition will improve Outcome: Progressing   Problem: Physical Regulation: Goal: Ability to maintain clinical measurements within normal limits will improve Outcome: Progressing   Problem: Safety: Goal: Periods of time without injury will increase Outcome: Progressing   Problem: Education: Goal: Knowledge of General Education information will improve Description: Including pain rating scale, medication(s)/side effects and non-pharmacologic comfort measures Outcome: Progressing   Problem: Health Behavior/Discharge Planning: Goal: Ability to manage health-related needs will improve Outcome: Progressing   Problem: Clinical Measurements: Goal: Ability to maintain clinical measurements within normal limits will improve Outcome: Progressing Goal: Will remain free from infection Outcome: Progressing Goal: Diagnostic test results will improve Outcome: Progressing Goal: Respiratory complications will improve Outcome: Progressing Goal: Cardiovascular complication will be  avoided Outcome: Progressing   Problem: Activity: Goal: Risk for activity intolerance will decrease Outcome: Progressing   Problem: Nutrition: Goal: Adequate nutrition will be maintained Outcome: Progressing   Problem: Coping: Goal: Level of anxiety will decrease Outcome: Progressing   Problem: Elimination: Goal: Will not experience complications related to bowel motility Outcome: Progressing Goal: Will not experience complications related to urinary retention Outcome: Progressing   Problem: Pain Managment: Goal: General experience of comfort will improve and/or be controlled Outcome: Progressing   Problem: Safety: Goal: Ability to remain free from injury will improve Outcome: Progressing   Problem: Skin Integrity: Goal: Risk for impaired skin integrity will decrease Outcome: Progressing   Problem: Health Behavior/Discharge Planning: Goal: Compliance with prescribed medication regimen will improve Outcome: Progressing   Problem: Nutritional: Goal: Ability to achieve adequate nutritional intake will improve Outcome: Progressing

## 2024-08-03 NOTE — BH IP Treatment Plan (Signed)
 Interdisciplinary Treatment and Diagnostic Plan Update  08/03/2024 Time of Session: 3:00PM [name] MRN: [id number]  Principal Diagnosis: <principal problem not specified>  Secondary Diagnoses: Active Problems:   Schizophrenia, paranoid (HCC)   Current Medications:  Current Facility-Administered Medications  Medication Dose Route Frequency Provider Last Rate Last Admin   acetaminophen  (TYLENOL ) tablet 650 mg  650 mg Oral Q6H PRN Starkes-Perry, Takia S, FNP       alum & mag hydroxide-simeth (MAALOX/MYLANTA) 200-200-20 MG/5ML suspension 30 mL  30 mL Oral Q4H PRN Starkes-Perry, Takia S, FNP       haloperidol  (HALDOL ) tablet 5 mg  5 mg Oral BID Jadapalle, Sree, MD   5 mg at 08/03/24 9060   Or   haloperidol  lactate (HALDOL ) injection 10 mg  10 mg Intramuscular BID Jadapalle, Sree, MD       HYDROcodone -acetaminophen  (NORCO/VICODIN) 5-325 MG per tablet 1-2 tablet  1-2 tablet Oral Q4H PRN Starkes-Perry, Takia S, FNP       magnesium  hydroxide (MILK OF MAGNESIA) suspension 30 mL  30 mL Oral Daily PRN Starkes-Perry, Takia S, FNP       multivitamin with minerals tablet 1 tablet  1 tablet Oral Daily Wilkie Majel RAMAN, FNP   1 tablet at 08/03/24 9060   OLANZapine  (ZYPREXA ) injection 5 mg  5 mg Intramuscular TID PRN Wilkie Majel RAMAN, FNP       OLANZapine  (ZYPREXA ) tablet 15 mg  15 mg Oral BID Jadapalle, Sree, MD   15 mg at 08/03/24 9060   OLANZapine  zydis (ZYPREXA ) disintegrating tablet 5 mg  5 mg Oral TID PRN Starkes-Perry, Takia S, FNP       ondansetron  (ZOFRAN ) tablet 4 mg  4 mg Oral Q6H PRN Wilkie Majel RAMAN, FNP       Or   ondansetron  (ZOFRAN ) injection 4 mg  4 mg Intravenous Q6H PRN Starkes-Perry, Majel RAMAN, FNP       thiamine  (VITAMIN B1) tablet 100 mg  100 mg Oral Daily Starkes-Perry, Takia S, FNP   100 mg at 08/03/24 9060   traZODone  (DESYREL ) tablet 50 mg  50 mg Oral QHS Starkes-Perry, Takia S, FNP   50 mg at 08/02/24 2152   PTA Medications: Medications Prior to Admission   Medication Sig Dispense Refill Last Dose/Taking   feeding supplement (ENSURE PLUS HIGH PROTEIN) LIQD Take 237 mLs by mouth 3 (three) times daily between meals.      Multiple Vitamin (MULTIVITAMIN WITH MINERALS) TABS tablet Take 1 tablet by mouth daily.      OLANZapine  (ZYPREXA ) 5 MG tablet Take 1 tablet (5 mg total) by mouth 2 (two) times daily.      OLANZapine  (ZYPREXA ) injection Inject 1 mL (5 mg total) into the muscle 2 (two) times daily.      thiamine  (VITAMIN B-1) 100 MG tablet Take 1 tablet (100 mg total) by mouth daily.      traZODone  (DESYREL ) 50 MG tablet Take 1 tablet (50 mg total) by mouth at bedtime.       Patient Stressors:    Patient Strengths:    Treatment Modalities: Medication Management, Group therapy, Case management,  1 to 1 session with clinician, Psychoeducation, Recreational therapy.   Physician Treatment Plan for Primary Diagnosis: <principal problem not specified> Long Term Goal(s): Improvement in symptoms so as ready for discharge   Short Term Goals: Ability to identify changes in lifestyle to reduce recurrence of condition will improve  Medication Management: Evaluate patient's response, side effects, and tolerance of medication regimen.  Therapeutic  Interventions: 1 to 1 sessions, Unit Group sessions and Medication administration.  Evaluation of Outcomes: Progressing  Physician Treatment Plan for Secondary Diagnosis: Active Problems:   Schizophrenia, paranoid (HCC)  Long Term Goal(s): Improvement in symptoms so as ready for discharge   Short Term Goals: Ability to identify changes in lifestyle to reduce recurrence of condition will improve     Medication Management: Evaluate patient's response, side effects, and tolerance of medication regimen.  Therapeutic Interventions: 1 to 1 sessions, Unit Group sessions and Medication administration.  Evaluation of Outcomes: Progressing   RN Treatment Plan for Primary Diagnosis: <principal problem not  specified> Long Term Goal(s): Knowledge of disease and therapeutic regimen to maintain health will improve  Short Term Goals: Ability to demonstrate self-control, Ability to participate in decision making will improve, Ability to verbalize feelings will improve, Ability to disclose and discuss suicidal ideas, Ability to identify and develop effective coping behaviors will improve, and Compliance with prescribed medications will improve  Medication Management: RN will administer medications as ordered by provider, will assess and evaluate patient's response and provide education to patient for prescribed medication. RN will report any adverse and/or side effects to prescribing provider.  Therapeutic Interventions: 1 on 1 counseling sessions, Psychoeducation, Medication administration, Evaluate responses to treatment, Monitor vital signs and CBGs as ordered, Perform/monitor CIWA, COWS, AIMS and Fall Risk screenings as ordered, Perform wound care treatments as ordered.  Evaluation of Outcomes: Progressing   LCSW Treatment Plan for Primary Diagnosis: <principal problem not specified> Long Term Goal(s): Safe transition to appropriate next level of care at discharge, Engage patient in therapeutic group addressing interpersonal concerns.  Short Term Goals: Engage patient in aftercare planning with referrals and resources, Increase social support, Increase ability to appropriately verbalize feelings, Increase emotional regulation, Facilitate acceptance of mental health diagnosis and concerns, and Increase skills for wellness and recovery  Therapeutic Interventions: Assess for all discharge needs, 1 to 1 time with Social worker, Explore available resources and support systems, Assess for adequacy in community support network, Educate family and significant other(s) on suicide prevention, Complete Psychosocial Assessment, Interpersonal group therapy.  Evaluation of Outcomes: Progressing   Progress in  Treatment: Attending groups: No. Participating in groups: No. Taking medication as prescribed: Yes. Toleration medication: Yes. Family/Significant other contact made: Yes, individual(s) contacted:  DSS and brother have both been contacted.  Patient understands diagnosis: No. Discussing patient identified problems/goals with staff: No. Medical problems stabilized or resolved: Yes. Denies suicidal/homicidal ideation: Yes. Issues/concerns per patient self-inventory: No. Other: none  New problem(s) identified: No, Describe:  none   New Short Term/Long Term Goal(s):elimination of symptoms of psychosis, medication management for mood stabilization; elimination of SI thoughts; development of comprehensive mental wellness plan. Update: 05/13/2024 No changes at this time.Update: 05/18/2024 No changes at this time. Update 05/23/2024:  No changes at this time. Update 05/28/2024:  No changes at this time. Update 06/02/2024:  No changes at this time. Update 06/07/2024:  No changes at this time. 06/12/24 Update: No changes at this time. Update 06/18/24:  No changes at this time  Update 06/24/24:  No changes at this time Update 06/29/24:  No changes at this time 07/04/24 Update: No changes. 07/09/24: No change 07/14/2024: No changes at this time.  Update 07/19/24: No changes at this time Update 07/25/23: No changes at this time Update 07/29/24 no changes  Update 08/03/2024:  No changes at this time.    Patient Goals:    I just need to leave, I don't need it  hereUpdate: 05/13/2024 No changes at this time.Update: 05/18/2024 No changes at this time. Update 05/23/2024:  No changes at this time.Update 05/28/2024:  No changes at this time. Update 06/02/2024:  No changes at this time. Update 06/07/2024:  No changes at this time. 06/12/24 Update: No changes at this time. Update 06/18/24:  No changes at this time Update 06/24/24:  No changes at this time Update 06/29/24:  No changes at this time 07/04/24 Update: No  changes. 07/09/24: No changes at this time.  07/14/2024: No changes at this time.   Update 07/19/24: No changes at this time Update 07/25/23: No changes at this time Update: 07/29/24  Update 08/03/2024:  No changes at this time.    Discharge Plan or Barriers:  CSW will assist with appropriate discharge planning. Update: 05/13/2024 No changes at this time.Update: 05/18/2024 No changes at this time. Update 05/23/2024:  No changes at this time.Update 05/28/2024:  CSW unable to identify appropriate decision maker, as pt's legal guardian has passed and CSW has reached out to APS caseworker multiple times to get clarity on protocol for enforcing medication until an appropriate party has been legal identified Update 06/02/2024:  No changes at this time. Update 06/07/2024:  CSW and provider still working to clarify appropriate horticulturist, commercial. Please refer to extensive notes in medical record. 06/12/24 Update: CSW and provider still working with DSS to obtain legal decision maker on patient's behalf. Update 06/18/24:  No changes at this time Update 06/24/24:  DSS received ex-parte, DSS to begin looking for placement, Dr.J to initiate enforced medication Update 06/29/24:  No changes at this time 07/04/24 Update: CSW to continue to provide support to APS case worker while DSS works on clinical cytogeneticist. No changes. 07/09/24 07/14/2024: Possible bed at [facility], CSW team to follow up with guardian regarding this.   Update 07/19/24: No changes at this time  Update 07/25/23: No changes at this time  Update 07/29/24 No changes at this time.  Update 08/03/2024:  Placement is still being sought.     Reason for Continuation of Hospitalization: Aggression Hallucinations   Estimated Length of Stay:TBD   Update 08/03/2024:  TBD  Last 3 [place] Suicide Severity Risk Score: Flowsheet Row Admission (Current) from 05/06/2024 in [facility] ED to Hosp-Admission (Discharged) from  05/02/2024 in [facility] ED from 04/30/2024 in [facility]  C-SSRS RISK CATEGORY No Risk No Risk No Risk    Last [facility] 2/9 Scores:     No data to display          Scribe for Treatment Team: Sherryle JINNY Margo, KEN 08/03/2024 4:08 PM

## 2024-08-03 NOTE — Group Note (Signed)
 Date:  08/03/2024 Time:  10:20 AM  Group Topic/Focus:  Orientation:   The focus of this group is to educate the patient on the purpose and policies of crisis stabilization and provide a format to answer questions about their admission.  The group details unit policies and expectations of patients while admitted.  Exercise for geriatric patients offers vast benefits, significantly improving physical function, mental health, and independence by reducing fall risk, managing chronic diseases (heart disease, diabetes, cancer), enhancing cognitive function, boosting mood, improving sleep, strengthening bones, and supporting longer, healthier lives. Regular activity helps combat age-related decline, preventing frailty and enabling older adults to live more independently and with better overall quality of life.   Participation Level:  Did Not Attend   Harlene LITTIE Gavel 08/03/2024, 10:20 AM

## 2024-08-03 NOTE — Progress Notes (Signed)
 Phone call back [facility] MD Progress Note   12:16 PM [name]  MRN:  [id number]  [name] is a 64 y.o. female admitted: Medicallyfor 05/02/2024 10:56 AM for dehydration, failure to thrive and hypoglycemia. She carries the psychiatric diagnoses of Schizophrenia and has medical hx of hypoglycemia and failure to thrive and has a past medical history of sinus bradycardia, chronic anemia, severe malnutrition and recurrent hypoglycemia.. .    Subjective:  Chart reviewed, case discussed in multidisciplinary meeting, patient seen during rounds.   Patient remains in her room seated.  Per nursing patient comes out of her room for her meals and continues to be delusional about her need for mental health treatment.  She refuses to work with the social work team regarding her discharge planning and refuses to identify her family members. Past Psychiatric History: see h&P Family History: History reviewed. No pertinent family history. Social History:  Social History   Substance and Sexual Activity  Alcohol Use Not Currently   Comment: Refuses to disclose how much     Social History   Substance and Sexual Activity  Drug Use No   Comment: Refuses to answer    Social History   Socioeconomic History   Marital status: Single    Spouse name: Not on file   Number of children: Not on file   Years of education: Not on file   Highest education level: Not on file  Occupational History   Not on file  Tobacco Use   Smoking status: Every Day    Current packs/day: 2.00    Types: Cigarettes    Passive exposure: Current   Smokeless tobacco: Never  Vaping Use   Vaping status: Never Used  Substance and Sexual Activity   Alcohol use: Not Currently    Comment: Refuses to disclose how much   Drug use: No    Comment: Refuses to answer   Sexual activity: Not Currently    Comment: refused to answer  Other Topics Concern   Not on file  Social History Narrative   Not on file   Social Drivers of Health    Tobacco Use: High Risk (05/06/2024)   Patient History    Smoking Tobacco Use: Every Day    Smokeless Tobacco Use: Never    Passive Exposure: Current  Financial Resource Strain: Not on file  Food Insecurity: Patient Declined (05/06/2024)   Epic    Worried About Programme Researcher, Broadcasting/film/video in the Last Year: Patient declined    Barista in the Last Year: Patient declined  Transportation Needs: Patient Declined (05/06/2024)   Epic    Lack of Transportation (Medical): Patient declined    Lack of Transportation (Non-Medical): Patient declined  Physical Activity: Not on file  Stress: Not on file  Social Connections: Not on file  Depression (PHQ2-9): Not on file  Alcohol Screen: Low Risk (05/06/2024)   Alcohol Screen    Last Alcohol Screening Score (AUDIT): 0  Housing: Unknown (05/06/2024)   Epic    Unable to Pay for Housing in the Last Year: Patient declined    Number of Times Moved in the Last Year: Not on file    Homeless in the Last Year: Not on file  Utilities: Patient Declined (05/06/2024)   Epic    Threatened with loss of utilities: Patient declined  Health Literacy: Not on file   Past Medical History:  Past Medical History:  Diagnosis Date   Anemia 10/02/2021   Non compliance w medication  regimen    Schizophrenia [facility])     Past Surgical History:  Procedure Laterality Date   BIOPSY  09/25/2021   Procedure: BIOPSY;  Surgeon: Teressa Toribio SQUIBB, MD;  Location: [facility];  Service: Gastroenterology;;   ESOPHAGOGASTRODUODENOSCOPY (EGD) WITH PROPOFOL  [name] 09/25/2021   Procedure: ESOPHAGOGASTRODUODENOSCOPY (EGD) WITH PROPOFOL ;  Surgeon: Teressa Toribio SQUIBB, MD;  Location: [facility];  Service: Gastroenterology;  Laterality: [name];  With NGT placement    Current Medications: Current Facility-Administered Medications  Medication Dose Route Frequency Provider Last Rate Last Admin   acetaminophen  (TYLENOL ) tablet 650 mg  650 mg Oral Q6H PRN Starkes-Perry, Takia S, FNP       alum &  mag hydroxide-simeth (MAALOX/MYLANTA) 200-200-20 MG/5ML suspension 30 mL  30 mL Oral Q4H PRN Starkes-Perry, Takia S, FNP       haloperidol  (HALDOL ) tablet 5 mg  5 mg Oral BID Haniyah Maciolek, MD   5 mg at 08/03/24 9060   Or   haloperidol  lactate (HALDOL ) injection 10 mg  10 mg Intramuscular BID Lidie Glade, MD       HYDROcodone -acetaminophen  (NORCO/VICODIN) 5-325 MG per tablet 1-2 tablet  1-2 tablet Oral Q4H PRN Starkes-Perry, Takia S, FNP       magnesium  hydroxide (MILK OF MAGNESIA) suspension 30 mL  30 mL Oral Daily PRN Starkes-Perry, Takia S, FNP       multivitamin with minerals tablet 1 tablet  1 tablet Oral Daily Wilkie Majel RAMAN, FNP   1 tablet at 08/03/24 9060   OLANZapine  (ZYPREXA ) injection 5 mg  5 mg Intramuscular TID PRN Wilkie Majel RAMAN, FNP       OLANZapine  (ZYPREXA ) tablet 15 mg  15 mg Oral BID Cheveyo Virginia, MD   15 mg at 08/03/24 9060   OLANZapine  zydis (ZYPREXA ) disintegrating tablet 5 mg  5 mg Oral TID PRN Starkes-Perry, Takia S, FNP       ondansetron  (ZOFRAN ) tablet 4 mg  4 mg Oral Q6H PRN Starkes-Perry, Majel RAMAN, FNP       Or   ondansetron  (ZOFRAN ) injection 4 mg  4 mg Intravenous Q6H PRN Starkes-Perry, Majel RAMAN, FNP       thiamine  (VITAMIN B1) tablet 100 mg  100 mg Oral Daily Wilkie Majel RAMAN, FNP   100 mg at 08/03/24 9060   traZODone  (DESYREL ) tablet 50 mg  50 mg Oral QHS Starkes-Perry, Takia S, FNP   50 mg at 08/02/24 2152    Lab Results:  No results found for this or any previous visit (from the past 48 hours).   Blood Alcohol level:  Lab Results  Component Value Date   Csf - [place] <15 05/02/2024   ETH <15 05/01/2024    Metabolic Disorder Labs: Lab Results  Component Value Date   HGBA1C 5.6 05/14/2024   MPG 114.02 05/14/2024   MPG 116.89 05/02/2024   Lab Results  Component Value Date   PROLACTIN 13.6 04/16/2015   Lab Results  Component Value Date   CHOL 219 (H) 05/14/2024   TRIG 84 05/14/2024   HDL 83 05/14/2024   CHOLHDL 2.6  05/14/2024   VLDL 17 05/14/2024   LDLCALC 119 (H) 05/14/2024   LDLCALC 130 (H) 05/01/2024    Physical Findings: AIMS:  , ,  ,  ,    CIWA:    COWS:      Psychiatric Specialty Exam:  Presentation  General Appearance:  Casual  Eye Contact: Fair  Speech: Normal rate  Speech Volume: Increased    Mood and Affect  Mood:  Angry  Affect: Congruent   Thought Process  Thought Processes: Disorganized  Descriptions of Associations: Concrete Orientation:Partial  Thought Content: Discharge focused Hallucinations:RESPONDING TO INTERNAL STIMULI Ideas of Reference:Paranoia; Delusions; Percusatory  Suicidal Thoughts:not endorsing Homicidal Thoughts:nor endorsing  Sensorium  Memory: Recent Poor  Judgment: Poor  Insight: Poor   Executive Functions  Concentration: Poor  Attention Span: Poor  Recall: Poor  Fund of Knowledge: Poor  Language: Poor   Psychomotor Activity  Psychomotor Activity:No data recorded Musculoskeletal: Strength & Muscle Tone: within normal limits Gait & Station: normal Assets  Assets: Resilience    Physical Exam: Physical Exam Vitals and nursing note reviewed.    ROS Blood pressure (!) 104/59, pulse 68, temperature (!) 97.3 F (36.3 C), resp. rate 16, height 5' 4 (1.626 m), weight 44.9 kg, SpO2 98%. Body mass index is 16.99 kg/m.  Diagnosis: Active Problems:   Schizophrenia, paranoid (HCC) Paranoid  PLAN: Safety and Monitoring:  -- Involuntary admission to inpatient psychiatric unit for safety, stabilization and treatment  -- Daily contact with patient to assess and evaluate symptoms and progress in treatment  -- Patient's case to be discussed in multi-disciplinary team meeting  -- Observation Level : q15 minute checks  -- Vital signs:  q12 hours  -- Precautions: suicide, elopement, and assault -- Encouraged patient to participate in unit milieu and in scheduled group therapies  2. Psychiatric Diagnoses and  Treatment: Patient  2 physicians approved non emergent forced medications legal guardian gave consent for Haldol  oral or IM  Zyprexa  15 mg BID  Haldol  10 mg bid added -  LAI haldol  Dec 100mg  Q4 weeks-07/26/2024 DSS gave consent-nonemergent forced medication consented by legal guardian  . Discharge Planning:   -- Social work and case management to assist with discharge planning and identification of hospital follow-up needs prior to discharge  -- Estimated LOS: 3-4 days  Allyn Foil, MD 08/03/2024, 12:16 PM

## 2024-08-03 NOTE — Plan of Care (Signed)
" °  Problem: Coping: Goal: Ability to verbalize frustrations and anger appropriately will improve Outcome: Not Progressing Goal: Ability to demonstrate self-control will improve Outcome: Not Progressing   Problem: Physical Regulation: Goal: Ability to maintain clinical measurements within normal limits will improve Outcome: Not Progressing   Problem: Safety: Goal: Periods of time without injury will increase Outcome: Not Progressing   "

## 2024-08-03 NOTE — Progress Notes (Signed)
" °   08/03/24 0343  Psych Admission Type (Psych Patients Only)  Admission Status Voluntary  Psychosocial Assessment  Patient Complaints None  Eye Contact Brief  Facial Expression Flat  Affect Appropriate to circumstance  Speech Soft  Interaction Isolative  Motor Activity Slow  Appearance/Hygiene In scrubs  Behavior Characteristics Appropriate to situation  Mood Pleasant  Thought Process  Coherency WDL  Content WDL  Delusions None reported or observed  Perception WDL  Hallucination None reported or observed  Judgment Poor  Confusion Mild  Danger to Self  Current suicidal ideation? Denies  Danger to Others  Danger to Others None reported or observed    "

## 2024-08-03 NOTE — Group Note (Signed)
 Recreation Therapy Group Note   Group Topic:General Recreation  Group Date: 08/03/2024 Start Time: 1400 End Time: 1450 Facilitators: Celestia Jeoffrey BRAVO, LRT, CTRS Location: Dayroom  Group Description: Bingo. Patients played multiple rounds of bingo. LRT and pts discussed the definition of leisure, things they do in their free time outside of the hospital, and how bingo is also a leisure activity. Pts received a coloring book, word search book, or journal as a prize.    Goal Area(s) Addressed:  Patient will identify a current leisure interest.  Patient will learn the definition of leisure. Patient will have the opportunity to try a new leisure activity. Patient will communicate with peers and LRT.   Affect/Mood: [name]   Participation Level: Did not attend    Clinical Observations/Individualized Feedback: Patient did not attend.  Plan: Continue to engage patient in RT group sessions 2-3x/week.   Jeoffrey BRAVO Celestia, LRT, CTRS 08/03/2024 4:32 PM

## 2024-08-04 NOTE — Progress Notes (Signed)
 Patient accepted medications without issue. Continues to remain in room much of the day. Coming to dayroom for meals. No interaction with peers observed. Patient resistant to SI/HI/AVH questioning. Overheard patient engaged in conversation but patient stopped once aware of this authors presence. Ongoing monitoring continues.   08/04/24 1100  Psych Admission Type (Psych Patients Only)  Admission Status Voluntary  Psychosocial Assessment  Patient Complaints None  Eye Contact Brief  Facial Expression Flat  Affect Appropriate to circumstance  Speech Soft  Interaction Isolative  Motor Activity Slow  Appearance/Hygiene In scrubs  Behavior Characteristics Cooperative  Mood Pleasant  Thought Process  Coherency WDL  Content WDL  Delusions None reported or observed  Perception WDL  Hallucination None reported or observed  Judgment Poor  Confusion Mild  Danger to Self  Current suicidal ideation? Denies  Danger to Others  Danger to Others None reported or observed

## 2024-08-04 NOTE — Plan of Care (Signed)
" °  Problem: Elimination: Goal: Will not experience complications related to urinary retention Outcome: Progressing   Problem: Pain Managment: Goal: General experience of comfort will improve and/or be controlled Outcome: Progressing   Problem: Education: Goal: Emotional status will improve Outcome: Not Progressing Goal: Mental status will improve Outcome: Not Progressing   Problem: Coping: Goal: Ability to verbalize frustrations and anger appropriately will improve Outcome: Not Progressing Goal: Ability to demonstrate self-control will improve Outcome: Not Progressing   Problem: Coping: Goal: Level of anxiety will decrease Outcome: Not Progressing   "

## 2024-08-04 NOTE — Plan of Care (Signed)
" °  Problem: Nutrition: Goal: Adequate nutrition will be maintained Outcome: Progressing   Problem: Clinical Measurements: Goal: Will remain free from infection Outcome: Progressing   Problem: Safety: Goal: Periods of time without injury will increase Outcome: Progressing   Problem: Coping: Goal: Ability to verbalize frustrations and anger appropriately will improve Outcome: Progressing   Problem: Activity: Goal: Interest or engagement in activities will improve Outcome: Progressing Goal: Sleeping patterns will improve Outcome: Progressing   Problem: Education: Goal: Emotional status will improve Outcome: Progressing Goal: Mental status will improve Outcome: Progressing   "

## 2024-08-04 NOTE — Group Note (Signed)
 Date:  08/04/2024 Time:  10:02 AM  Group Topic/Focus:   Goals Group:   The focus of this group is to help patients establish daily goals to achieve during treatment and discuss how the patient can incorporate goal setting into their daily lives to aide in recovery.    Participation Level:  Did Not Attend  Maglione,Holle Sprick E 08/04/2024, 10:02 AM

## 2024-08-04 NOTE — Group Note (Signed)
 Date:  08/04/2024 Time:  8:41 PM  Group Topic/Focus:  Wrap-Up Group:   The focus of this group is to help patients review their daily goal of treatment and discuss progress on daily workbooks.    Participation Level:  Did Not Attend  Beatris ONEIDA Hasten 08/04/2024, 8:41 PM

## 2024-08-04 NOTE — Progress Notes (Signed)
 Patient was cooperative and pleasant. Isolated in room but tolerated medications without issues. Denies anxiety, depression, SI/HI/AVH. Slept for 7.25  hours.    08/03/24 2100  Psych Admission Type (Psych Patients Only)  Admission Status Voluntary  Psychosocial Assessment  Patient Complaints None  Eye Contact Brief  Facial Expression Flat  Affect Appropriate to circumstance  Speech Soft  Interaction Isolative  Motor Activity Slow  Appearance/Hygiene In scrubs  Behavior Characteristics Appropriate to situation  Mood Pleasant  Thought Process  Coherency WDL  Content WDL  Delusions None reported or observed  Perception WDL  Hallucination None reported or observed  Judgment Poor  Confusion Mild  Danger to Self  Current suicidal ideation? Denies  Danger to Others  Danger to Others None reported or observed

## 2024-08-04 NOTE — Progress Notes (Signed)
 Phone call back [facility] MD Progress Note   9:44 PM [name]  MRN:  [id number]  [name] is a 64 y.o. female admitted: Medicallyfor 05/02/2024 10:56 AM for dehydration, failure to thrive and hypoglycemia. She carries the psychiatric diagnoses of Schizophrenia and has medical hx of hypoglycemia and failure to thrive and has a past medical history of sinus bradycardia, chronic anemia, severe malnutrition and recurrent hypoglycemia.. .    Subjective:  Chart reviewed, case discussed in multidisciplinary meeting, patient seen during rounds.    Patient is noted to be resting in her room.  She offers no complaints.  Per nursing staff patient is taking her medications with no reported side effects.  Patient is not displaying any behavioral outbursts.  APS is working on the placement for this patient   . Past Psychiatric History: see h&P Family History: History reviewed. No pertinent family history. Social History:  Social History   Substance and Sexual Activity  Alcohol Use Not Currently   Comment: Refuses to disclose how much     Social History   Substance and Sexual Activity  Drug Use No   Comment: Refuses to answer    Social History   Socioeconomic History   Marital status: Single    Spouse name: Not on file   Number of children: Not on file   Years of education: Not on file   Highest education level: Not on file  Occupational History   Not on file  Tobacco Use   Smoking status: Every Day    Current packs/day: 2.00    Types: Cigarettes    Passive exposure: Current   Smokeless tobacco: Never  Vaping Use   Vaping status: Never Used  Substance and Sexual Activity   Alcohol use: Not Currently    Comment: Refuses to disclose how much   Drug use: No    Comment: Refuses to answer   Sexual activity: Not Currently    Comment: refused to answer  Other Topics Concern   Not on file  Social History Narrative   Not on file   Social Drivers of Health   Tobacco Use: High Risk  (05/06/2024)   Patient History    Smoking Tobacco Use: Every Day    Smokeless Tobacco Use: Never    Passive Exposure: Current  Financial Resource Strain: Not on file  Food Insecurity: Patient Declined (05/06/2024)   Epic    Worried About Programme Researcher, Broadcasting/film/video in the Last Year: Patient declined    Barista in the Last Year: Patient declined  Transportation Needs: Patient Declined (05/06/2024)   Epic    Lack of Transportation (Medical): Patient declined    Lack of Transportation (Non-Medical): Patient declined  Physical Activity: Not on file  Stress: Not on file  Social Connections: Not on file  Depression (PHQ2-9): Not on file  Alcohol Screen: Low Risk (05/06/2024)   Alcohol Screen    Last Alcohol Screening Score (AUDIT): 0  Housing: Unknown (05/06/2024)   Epic    Unable to Pay for Housing in the Last Year: Patient declined    Number of Times Moved in the Last Year: Not on file    Homeless in the Last Year: Not on file  Utilities: Patient Declined (05/06/2024)   Epic    Threatened with loss of utilities: Patient declined  Health Literacy: Not on file   Past Medical History:  Past Medical History:  Diagnosis Date   Anemia 10/02/2021   Non compliance w medication regimen  Schizophrenia [facility])     Past Surgical History:  Procedure Laterality Date   BIOPSY  09/25/2021   Procedure: BIOPSY;  Surgeon: Teressa Toribio SQUIBB, MD;  Location: [facility];  Service: Gastroenterology;;   ESOPHAGOGASTRODUODENOSCOPY (EGD) WITH PROPOFOL  [name] 09/25/2021   Procedure: ESOPHAGOGASTRODUODENOSCOPY (EGD) WITH PROPOFOL ;  Surgeon: Teressa Toribio SQUIBB, MD;  Location: [facility];  Service: Gastroenterology;  Laterality: [name];  With NGT placement    Current Medications: Current Facility-Administered Medications  Medication Dose Route Frequency Provider Last Rate Last Admin   acetaminophen  (TYLENOL ) tablet 650 mg  650 mg Oral Q6H PRN Starkes-Perry, Takia S, FNP       alum & mag hydroxide-simeth  (MAALOX/MYLANTA) 200-200-20 MG/5ML suspension 30 mL  30 mL Oral Q4H PRN Starkes-Perry, Takia S, FNP       haloperidol  (HALDOL ) tablet 5 mg  5 mg Oral BID Elisabel Hanover, MD   5 mg at 08/04/24 2117   Or   haloperidol  lactate (HALDOL ) injection 10 mg  10 mg Intramuscular BID Cashlyn Huguley, MD       HYDROcodone -acetaminophen  (NORCO/VICODIN) 5-325 MG per tablet 1-2 tablet  1-2 tablet Oral Q4H PRN Starkes-Perry, Takia S, FNP       magnesium  hydroxide (MILK OF MAGNESIA) suspension 30 mL  30 mL Oral Daily PRN Starkes-Perry, Takia S, FNP       multivitamin with minerals tablet 1 tablet  1 tablet Oral Daily Wilkie Majel RAMAN, FNP   1 tablet at 08/04/24 9062   OLANZapine  (ZYPREXA ) injection 5 mg  5 mg Intramuscular TID PRN Wilkie Majel RAMAN, FNP       OLANZapine  (ZYPREXA ) tablet 15 mg  15 mg Oral BID Mahalia Dykes, MD   15 mg at 08/04/24 2116   OLANZapine  zydis (ZYPREXA ) disintegrating tablet 5 mg  5 mg Oral TID PRN Starkes-Perry, Takia S, FNP       ondansetron  (ZOFRAN ) tablet 4 mg  4 mg Oral Q6H PRN Starkes-Perry, Majel RAMAN, FNP       Or   ondansetron  (ZOFRAN ) injection 4 mg  4 mg Intravenous Q6H PRN Starkes-Perry, Majel RAMAN, FNP       thiamine  (VITAMIN B1) tablet 100 mg  100 mg Oral Daily Starkes-Perry, Takia S, FNP   100 mg at 08/04/24 9061   traZODone  (DESYREL ) tablet 50 mg  50 mg Oral QHS Starkes-Perry, Takia S, FNP   50 mg at 08/04/24 2117    Lab Results:  No results found for this or any previous visit (from the past 48 hours).   Blood Alcohol level:  Lab Results  Component Value Date   [facility] <15 05/02/2024   ETH <15 05/01/2024    Metabolic Disorder Labs: Lab Results  Component Value Date   HGBA1C 5.6 05/14/2024   MPG 114.02 05/14/2024   MPG 116.89 05/02/2024   Lab Results  Component Value Date   PROLACTIN 13.6 04/16/2015   Lab Results  Component Value Date   CHOL 219 (H) 05/14/2024   TRIG 84 05/14/2024   HDL 83 05/14/2024   CHOLHDL 2.6 05/14/2024   VLDL 17  05/14/2024   LDLCALC 119 (H) 05/14/2024   LDLCALC 130 (H) 05/01/2024    Physical Findings: AIMS:  , ,  ,  ,    CIWA:    COWS:      Psychiatric Specialty Exam:  Presentation  General Appearance:  Casual  Eye Contact: Fair  Speech: Normal rate  Speech Volume: Increased    Mood and Affect  Mood: Angry  Affect: Congruent  Thought Process  Thought Processes: Disorganized  Descriptions of Associations: Concrete Orientation:Partial  Thought Content: Discharge focused Hallucinations:RESPONDING TO INTERNAL STIMULI Ideas of Reference:Paranoia; Delusions; Percusatory  Suicidal Thoughts:not endorsing Homicidal Thoughts:nor endorsing  Sensorium  Memory: Recent Poor  Judgment: Poor  Insight: Poor   Executive Functions  Concentration: Poor  Attention Span: Poor  Recall: Poor  Fund of Knowledge: Poor  Language: Poor   Psychomotor Activity  Psychomotor Activity:No data recorded Musculoskeletal: Strength & Muscle Tone: within normal limits Gait & Station: normal Assets  Assets: Resilience    Physical Exam: Physical Exam Vitals and nursing note reviewed.    ROS Blood pressure 138/88, pulse 69, temperature 97.6 F (36.4 C), resp. rate 18, height 5' 4 (1.626 m), weight 44.9 kg, SpO2 100%. Body mass index is 16.99 kg/m.  Diagnosis: Active Problems:   Schizophrenia, paranoid (HCC) Paranoid  PLAN: Safety and Monitoring:  -- Involuntary admission to inpatient psychiatric unit for safety, stabilization and treatment  -- Daily contact with patient to assess and evaluate symptoms and progress in treatment  -- Patient's case to be discussed in multi-disciplinary team meeting  -- Observation Level : q15 minute checks  -- Vital signs:  q12 hours  -- Precautions: suicide, elopement, and assault -- Encouraged patient to participate in unit milieu and in scheduled group therapies  2. Psychiatric Diagnoses and Treatment: Patient  2  physicians approved non emergent forced medications legal guardian gave consent for Haldol  oral or IM  Zyprexa  15 mg BID  Haldol  10 mg bid added -  LAI haldol  Dec 100mg  Q4 weeks-07/26/2024 DSS gave consent-nonemergent forced medication consented by legal guardian  . Discharge Planning:   -- Social work and case management to assist with discharge planning and identification of hospital follow-up needs prior to discharge  -- Estimated LOS: 3-4 days  Allyn Foil, MD 08/04/2024, 9:44 PM

## 2024-08-04 NOTE — Group Note (Signed)
 Physical/Occupational Therapy Group Note  Group Topic: Pain Management and Coping   Group Date: 08/04/2024 Start Time: 1300 End Time: 1345 Facilitators: Clive Warren CROME, OT   Group Description: Group discussed impact of chronic/acute pain on safety and independence with functional tasks and impact on mental health.  Identified and discussed any previously learned or implemented strategies used.  Discussed and reviewed cognitive behavioral pain coping strategies to address/improve overall management of pain. Discussed relaxation, distraction techniques, cognitive restructuring, activity pacing/energy conservation, environment/home safety modifications, and role of sleep and sleep hygiene. Allowed time for questions and further discussion.  Therapeutic Goal(s):  Identify and discuss previously utilized pain coping strategies and implications of pain on function/well-being Identify and discuss implementing new cognitive behavioral pain coping strategies into daily routines Demonstrate understanding and performance of learned cognitive behavioral pain coping strategies  Individual Participation: Pt did not attend.  Participation Level: Did not attend   Participation Quality:   Behavior:   Speech/Thought Process:   Affect/Mood:   Insight:   Judgement:   Modes of Intervention:   Patient Response to Interventions:    Plan: Continue to engage patient in PT/OT groups 1 - 2x/week.  Jaylend Reiland R., MPH, MS, OTR/L ascom [phone] 08/04/24, 3:50 PM

## 2024-08-04 NOTE — Group Note (Signed)
 Recreation Therapy Group Note   Group Topic:Emotion Expression  Group Date: 08/04/2024 Start Time: 1400 End Time: 1445 Facilitators: Celestia Jeoffrey BRAVO, LRT, CTRS Location: Dayroom  Group Description: Painting a Peaceful Place. Patients and LRT discuss what it means to be at peace, what it feels like physically and mentally. Pts are given a canvas and watercolor paint to use and encouraged to draw their idea of a peaceful place. Pts and LRT discuss how they use this in their daily life post discharge. Pts are encouraged to take their canvas home with them as a reminder to find their peaceful place whenever they are feeling depressed, anxious, etc.    Goal Area(s) Addressed:  Patient will identify what it means to experience a peaceful emotion. Patient will identify a new coping skill.  Patient will express their emotions through art. Patients will increase communication by talking with LRT and peers while in group.   Affect/Mood: [name]   Participation Level: Did not attend    Clinical Observations/Individualized Feedback: Patient did not attend.  Plan: Continue to engage patient in RT group sessions 2-3x/week.   Jeoffrey BRAVO Celestia, LRT, CTRS 08/04/2024 4:30 PM

## 2024-08-05 DIAGNOSIS — F200 Paranoid schizophrenia: Secondary | ICD-10-CM | POA: Diagnosis not present

## 2024-08-05 NOTE — Group Note (Signed)
 Date:  08/05/2024 Time:  4:43 PM  Group Topic/Focus:  Wellness Toolbox:   The focus of this group is to discuss various aspects of wellness, balancing those aspects and exploring ways to increase the ability to experience wellness.  Patients will create a wellness toolbox for use upon discharge.    Participation Level:  Did Not Attend   [name] 08/05/2024, 4:43 PM

## 2024-08-05 NOTE — Progress Notes (Signed)
 Pt disoriented to situation. She denies SI/HI/AVH at this time. She is isolative to bedroom. Pt voiced no complaints to this clinical research associate. Pt is med compliant and safe on the unit at this time with Q15 min safety checks in place.    08/04/24 2117  Psych Admission Type (Psych Patients Only)  Admission Status Involuntary  Psychosocial Assessment  Patient Complaints None  Eye Contact Brief  Facial Expression Flat  Affect Sullen  Speech Soft  Interaction Isolative  Motor Activity Slow  Appearance/Hygiene In scrubs  Behavior Characteristics Cooperative  Mood Pleasant  Thought Process  Coherency WDL  Content WDL  Delusions None reported or observed  Perception WDL  Hallucination None reported or observed  Judgment Poor  Confusion Mild  Danger to Self  Current suicidal ideation? Denies  Danger to Others  Danger to Others None reported or observed

## 2024-08-05 NOTE — Progress Notes (Signed)
 Phone call back [facility] MD Progress Note   1:42 PM [name]  MRN:  [id number]  [name] is a 64 y.o. female admitted: Medicallyfor 05/02/2024 10:56 AM for dehydration, failure to thrive and hypoglycemia. She carries the psychiatric diagnoses of Schizophrenia and has medical hx of hypoglycemia and failure to thrive and has a past medical history of sinus bradycardia, chronic anemia, severe malnutrition and recurrent hypoglycemia.. .    Subjective:  Chart reviewed, case discussed in multidisciplinary meeting, patient seen during rounds.    Patient is noted to be resting in her room.  She offers no complaints.  Per nursing staff patient is taking her medications with no reported side effects.  Patient is not displaying any behavioral outbursts.  APS is working on the placement for this patient   . Past Psychiatric History: see h&P Family History: History reviewed. No pertinent family history. Social History:  Social History   Substance and Sexual Activity  Alcohol Use Not Currently   Comment: Refuses to disclose how much     Social History   Substance and Sexual Activity  Drug Use No   Comment: Refuses to answer    Social History   Socioeconomic History   Marital status: Single    Spouse name: Not on file   Number of children: Not on file   Years of education: Not on file   Highest education level: Not on file  Occupational History   Not on file  Tobacco Use   Smoking status: Every Day    Current packs/day: 2.00    Types: Cigarettes    Passive exposure: Current   Smokeless tobacco: Never  Vaping Use   Vaping status: Never Used  Substance and Sexual Activity   Alcohol use: Not Currently    Comment: Refuses to disclose how much   Drug use: No    Comment: Refuses to answer   Sexual activity: Not Currently    Comment: refused to answer  Other Topics Concern   Not on file  Social History Narrative   Not on file   Social Drivers of Health   Tobacco Use: High Risk  (05/06/2024)   Patient History    Smoking Tobacco Use: Every Day    Smokeless Tobacco Use: Never    Passive Exposure: Current  Financial Resource Strain: Not on file  Food Insecurity: Patient Declined (05/06/2024)   Epic    Worried About Programme Researcher, Broadcasting/film/video in the Last Year: Patient declined    Barista in the Last Year: Patient declined  Transportation Needs: Patient Declined (05/06/2024)   Epic    Lack of Transportation (Medical): Patient declined    Lack of Transportation (Non-Medical): Patient declined  Physical Activity: Not on file  Stress: Not on file  Social Connections: Not on file  Depression (PHQ2-9): Not on file  Alcohol Screen: Low Risk (05/06/2024)   Alcohol Screen    Last Alcohol Screening Score (AUDIT): 0  Housing: Unknown (05/06/2024)   Epic    Unable to Pay for Housing in the Last Year: Patient declined    Number of Times Moved in the Last Year: Not on file    Homeless in the Last Year: Not on file  Utilities: Patient Declined (05/06/2024)   Epic    Threatened with loss of utilities: Patient declined  Health Literacy: Not on file   Past Medical History:  Past Medical History:  Diagnosis Date   Anemia 10/02/2021   Non compliance w medication regimen  Schizophrenia [facility])     Past Surgical History:  Procedure Laterality Date   BIOPSY  09/25/2021   Procedure: BIOPSY;  Surgeon: Teressa Toribio SQUIBB, MD;  Location: [facility];  Service: Gastroenterology;;   ESOPHAGOGASTRODUODENOSCOPY (EGD) WITH PROPOFOL  [name] 09/25/2021   Procedure: ESOPHAGOGASTRODUODENOSCOPY (EGD) WITH PROPOFOL ;  Surgeon: Teressa Toribio SQUIBB, MD;  Location: [facility];  Service: Gastroenterology;  Laterality: [name];  With NGT placement    Current Medications: Current Facility-Administered Medications  Medication Dose Route Frequency Provider Last Rate Last Admin   acetaminophen  (TYLENOL ) tablet 650 mg  650 mg Oral Q6H PRN Starkes-Perry, Takia S, FNP       alum & mag hydroxide-simeth  (MAALOX/MYLANTA) 200-200-20 MG/5ML suspension 30 mL  30 mL Oral Q4H PRN Starkes-Perry, Takia S, FNP       haloperidol  (HALDOL ) tablet 5 mg  5 mg Oral BID Jadapalle, Sree, MD   5 mg at 08/05/24 0845   Or   haloperidol  lactate (HALDOL ) injection 10 mg  10 mg Intramuscular BID Jadapalle, Sree, MD       HYDROcodone -acetaminophen  (NORCO/VICODIN) 5-325 MG per tablet 1-2 tablet  1-2 tablet Oral Q4H PRN Starkes-Perry, Takia S, FNP       magnesium  hydroxide (MILK OF MAGNESIA) suspension 30 mL  30 mL Oral Daily PRN Starkes-Perry, Takia S, FNP       multivitamin with minerals tablet 1 tablet  1 tablet Oral Daily Wilkie Majel RAMAN, FNP   1 tablet at 08/05/24 9157   OLANZapine  (ZYPREXA ) injection 5 mg  5 mg Intramuscular TID PRN Wilkie Majel RAMAN, FNP       OLANZapine  (ZYPREXA ) tablet 15 mg  15 mg Oral BID Jadapalle, Sree, MD   15 mg at 08/05/24 0845   OLANZapine  zydis (ZYPREXA ) disintegrating tablet 5 mg  5 mg Oral TID PRN Starkes-Perry, Takia S, FNP       ondansetron  (ZOFRAN ) tablet 4 mg  4 mg Oral Q6H PRN Starkes-Perry, Majel RAMAN, FNP       Or   ondansetron  (ZOFRAN ) injection 4 mg  4 mg Intravenous Q6H PRN Starkes-Perry, Majel RAMAN, FNP       thiamine  (VITAMIN B1) tablet 100 mg  100 mg Oral Daily Starkes-Perry, Takia S, FNP   100 mg at 08/05/24 0846   traZODone  (DESYREL ) tablet 50 mg  50 mg Oral QHS Starkes-Perry, Takia S, FNP   50 mg at 08/04/24 2117    Lab Results:  No results found for this or any previous visit (from the past 48 hours).   Blood Alcohol level:  Lab Results  Component Value Date   [facility] <15 05/02/2024   ETH <15 05/01/2024    Metabolic Disorder Labs: Lab Results  Component Value Date   HGBA1C 5.6 05/14/2024   MPG 114.02 05/14/2024   MPG 116.89 05/02/2024   Lab Results  Component Value Date   PROLACTIN 13.6 04/16/2015   Lab Results  Component Value Date   CHOL 219 (H) 05/14/2024   TRIG 84 05/14/2024   HDL 83 05/14/2024   CHOLHDL 2.6 05/14/2024   VLDL 17  05/14/2024   LDLCALC 119 (H) 05/14/2024   LDLCALC 130 (H) 05/01/2024    Physical Findings: AIMS:  , ,  ,  ,    CIWA:    COWS:      Psychiatric Specialty Exam:  Presentation  General Appearance:  Casual  Eye Contact: Fair  Speech: Normal rate  Speech Volume: Increased    Mood and Affect  Mood: Angry  Affect: Congruent  Thought Process  Thought Processes: Disorganized  Descriptions of Associations: Concrete Orientation:Partial  Thought Content: Discharge focused Hallucinations:RESPONDING TO INTERNAL STIMULI Ideas of Reference:Paranoia; Delusions; Percusatory  Suicidal Thoughts:not endorsing Homicidal Thoughts:nor endorsing  Sensorium  Memory: Recent Poor  Judgment: Poor  Insight: Poor   Executive Functions  Concentration: Poor  Attention Span: Poor  Recall: Poor  Fund of Knowledge: Poor  Language: Poor   Psychomotor Activity  Psychomotor Activity:No data recorded Musculoskeletal: Strength & Muscle Tone: within normal limits Gait & Station: normal Assets  Assets: Resilience    Physical Exam: Physical Exam Vitals and nursing note reviewed.    ROS Blood pressure 106/71, pulse (!) 57, temperature 98.1 F (36.7 C), resp. rate 17, height 5' 4 (1.626 m), weight 44.9 kg, SpO2 100%. Body mass index is 16.99 kg/m.  Diagnosis: Active Problems:   Schizophrenia, paranoid (HCC) Paranoid  PLAN: Safety and Monitoring:  -- Involuntary admission to inpatient psychiatric unit for safety, stabilization and treatment  -- Daily contact with patient to assess and evaluate symptoms and progress in treatment  -- Patient's case to be discussed in multi-disciplinary team meeting  -- Observation Level : q15 minute checks  -- Vital signs:  q12 hours  -- Precautions: suicide, elopement, and assault -- Encouraged patient to participate in unit milieu and in scheduled group therapies  2. Psychiatric Diagnoses and Treatment: Patient  2  physicians approved non emergent forced medications legal guardian gave consent for Haldol  oral or IM  Zyprexa  15 mg BID  Haldol  10 mg bid added -  LAI haldol  Dec 100mg  Q4 weeks-07/26/2024 DSS gave consent-nonemergent forced medication consented by legal guardian  . Discharge Planning:   -- Social work and case management to assist with discharge planning and identification of hospital follow-up needs prior to discharge  -- Estimated LOS: 3-4 days  Millie JONELLE Manners, MD 08/05/2024, 1:42 PM

## 2024-08-05 NOTE — Progress Notes (Signed)
 Behavior:   Pleasant and cooperative.  Present in the milieu prior to meals.   Psych assessment: Denies SI/HI.  Group attendance:  0/3  Medication/ PRNs: Compliant.  Pain: Denies  15 min checks in place for safety.

## 2024-08-05 NOTE — Plan of Care (Signed)
   Problem: Education: Goal: Emotional status will improve Outcome: Progressing Goal: Mental status will improve Outcome: Progressing   Problem: Activity: Goal: Interest or engagement in activities will improve Outcome: Progressing

## 2024-08-05 NOTE — Plan of Care (Signed)
" °  Problem: Coping: Goal: Ability to demonstrate self-control will improve Outcome: Progressing   Problem: Health Behavior/Discharge Planning: Goal: Compliance with treatment plan for underlying cause of condition will improve Outcome: Progressing   Problem: Education: Goal: Mental status will improve Outcome: Not Progressing   "

## 2024-08-05 NOTE — Group Note (Signed)
 [facility] LCSW Group Therapy Note   Group Date: 08/05/2024 Start Time: 1405 End Time: 1435   Type of Therapy/Topic:  Group Therapy:  Balance in Life  Participation Level:  Did Not Attend   Description of Group:    This group will address the concept of balance and how it feels and looks when one is unbalanced. Patients will be encouraged to process areas in their lives that are out of balance, and identify reasons for remaining unbalanced. Facilitators will guide patients utilizing problem- solving interventions to address and correct the stressor making their life unbalanced. Understanding and applying boundaries will be explored and addressed for obtaining  and maintaining a balanced life. Patients will be encouraged to explore ways to assertively make their unbalanced needs known to significant others in their lives, using other group members and facilitator for support and feedback.  Therapeutic Goals: Patient will identify two or more emotions or situations they have that consume much of in their lives. Patient will identify signs/triggers that life has become out of balance:  Patient will identify two ways to set boundaries in order to achieve balance in their lives:  Patient will demonstrate ability to communicate their needs through discussion and/or role plays  Summary of Patient Progress:  Not able to assess.  Pt did not participate.    Therapeutic Modalities:   Cognitive Behavioral Therapy Solution-Focused Therapy Assertiveness Training   Rexene LELON Mae, LCSWA

## 2024-08-05 NOTE — Group Note (Signed)
 Date:  08/05/2024 Time:  9:41 AM  Group Topic/Focus:  Coping With Mental Health Crisis:   The purpose of this group is to help patients identify strategies for coping with mental health crisis.  Group discusses possible causes of crisis and ways to manage them effectively.    Participation Level:  Did Not Attend    [name] Bias 08/05/2024, 9:41 AM

## 2024-08-06 DIAGNOSIS — F200 Paranoid schizophrenia: Secondary | ICD-10-CM | POA: Diagnosis not present

## 2024-08-06 NOTE — Progress Notes (Signed)
 Phone call back [facility] MD Progress Note   12:59 PM [name]  MRN:  [id number]  [name] is a 64 y.o. female admitted: Medicallyfor 05/02/2024 10:56 AM for dehydration, failure to thrive and hypoglycemia. She carries the psychiatric diagnoses of Schizophrenia and has medical hx of hypoglycemia and failure to thrive and has a past medical history of sinus bradycardia, chronic anemia, severe malnutrition and recurrent hypoglycemia.. .    Subjective:  Chart reviewed, case discussed in multidisciplinary meeting, patient seen during rounds.    The patient is a 64 year old female who has been hospitalized for approximately 91 days. She continues to spend most of her time in her room, demonstrating largely isolative behavior. Her presentation is consistent with her baseline paranoia and persistent delusional thought processes, with no significant change from prior assessments.  She has not exhibited aggressive behaviors and remains generally calm, cooperative, and approachable. No acute agitation or behavioral dyscontrol was observed.  The patient continues to await appropriate placement. At this time, she remains psychiatrically stable at baseline, with ongoing chronic psychotic symptoms. Continued inpatient care is medically necessary primarily due to placement considerations rather than acute psychiatric decompensation.   . Past Psychiatric History: see h&P Family History: History reviewed. No pertinent family history. Social History:  Social History   Substance and Sexual Activity  Alcohol Use Not Currently   Comment: Refuses to disclose how much     Social History   Substance and Sexual Activity  Drug Use No   Comment: Refuses to answer    Social History   Socioeconomic History   Marital status: Single    Spouse name: Not on file   Number of children: Not on file   Years of education: Not on file   Highest education level: Not on file  Occupational History   Not on file   Tobacco Use   Smoking status: Every Day    Current packs/day: 2.00    Types: Cigarettes    Passive exposure: Current   Smokeless tobacco: Never  Vaping Use   Vaping status: Never Used  Substance and Sexual Activity   Alcohol use: Not Currently    Comment: Refuses to disclose how much   Drug use: No    Comment: Refuses to answer   Sexual activity: Not Currently    Comment: refused to answer  Other Topics Concern   Not on file  Social History Narrative   Not on file   Social Drivers of Health   Tobacco Use: High Risk (05/06/2024)   Patient History    Smoking Tobacco Use: Every Day    Smokeless Tobacco Use: Never    Passive Exposure: Current  Financial Resource Strain: Not on file  Food Insecurity: Patient Declined (05/06/2024)   Epic    Worried About Programme Researcher, Broadcasting/film/video in the Last Year: Patient declined    Barista in the Last Year: Patient declined  Transportation Needs: Patient Declined (05/06/2024)   Epic    Lack of Transportation (Medical): Patient declined    Lack of Transportation (Non-Medical): Patient declined  Physical Activity: Not on file  Stress: Not on file  Social Connections: Not on file  Depression (PHQ2-9): Not on file  Alcohol Screen: Low Risk (05/06/2024)   Alcohol Screen    Last Alcohol Screening Score (AUDIT): 0  Housing: Unknown (05/06/2024)   Epic    Unable to Pay for Housing in the Last Year: Patient declined    Number of Times Moved in the  Last Year: Not on file    Homeless in the Last Year: Not on file  Utilities: Patient Declined (05/06/2024)   Epic    Threatened with loss of utilities: Patient declined  Health Literacy: Not on file   Past Medical History:  Past Medical History:  Diagnosis Date   Anemia 10/02/2021   Non compliance w medication regimen    Schizophrenia [facility])     Past Surgical History:  Procedure Laterality Date   BIOPSY  09/25/2021   Procedure: BIOPSY;  Surgeon: Teressa Toribio SQUIBB, MD;  Location: [facility];  Service: Gastroenterology;;   ESOPHAGOGASTRODUODENOSCOPY (EGD) WITH PROPOFOL  [name] 09/25/2021   Procedure: ESOPHAGOGASTRODUODENOSCOPY (EGD) WITH PROPOFOL ;  Surgeon: Teressa Toribio SQUIBB, MD;  Location: [facility];  Service: Gastroenterology;  Laterality: [name];  With NGT placement    Current Medications: Current Facility-Administered Medications  Medication Dose Route Frequency Provider Last Rate Last Admin   acetaminophen  (TYLENOL ) tablet 650 mg  650 mg Oral Q6H PRN Starkes-Perry, Takia S, FNP       alum & mag hydroxide-simeth (MAALOX/MYLANTA) 200-200-20 MG/5ML suspension 30 mL  30 mL Oral Q4H PRN Starkes-Perry, Takia S, FNP       haloperidol  (HALDOL ) tablet 5 mg  5 mg Oral BID Jadapalle, Sree, MD   5 mg at 08/06/24 1108   Or   haloperidol  lactate (HALDOL ) injection 10 mg  10 mg Intramuscular BID Jadapalle, Sree, MD       HYDROcodone -acetaminophen  (NORCO/VICODIN) 5-325 MG per tablet 1-2 tablet  1-2 tablet Oral Q4H PRN Starkes-Perry, Takia S, FNP       magnesium  hydroxide (MILK OF MAGNESIA) suspension 30 mL  30 mL Oral Daily PRN Starkes-Perry, Takia S, FNP       multivitamin with minerals tablet 1 tablet  1 tablet Oral Daily Wilkie Majel RAMAN, FNP   1 tablet at 08/06/24 1108   OLANZapine  (ZYPREXA ) injection 5 mg  5 mg Intramuscular TID PRN Wilkie Majel RAMAN, FNP       OLANZapine  (ZYPREXA ) tablet 15 mg  15 mg Oral BID Jadapalle, Sree, MD   15 mg at 08/06/24 1108   OLANZapine  zydis (ZYPREXA ) disintegrating tablet 5 mg  5 mg Oral TID PRN Starkes-Perry, Takia S, FNP       ondansetron  (ZOFRAN ) tablet 4 mg  4 mg Oral Q6H PRN Starkes-Perry, Majel RAMAN, FNP       Or   ondansetron  (ZOFRAN ) injection 4 mg  4 mg Intravenous Q6H PRN Starkes-Perry, Majel RAMAN, FNP       thiamine  (VITAMIN B1) tablet 100 mg  100 mg Oral Daily Wilkie Majel RAMAN, FNP   100 mg at 08/06/24 1108   traZODone  (DESYREL ) tablet 50 mg  50 mg Oral QHS Starkes-Perry, Takia S, FNP   50 mg at 08/05/24 2122    Lab Results:   No results found for this or any previous visit (from the past 48 hours).   Blood Alcohol level:  Lab Results  Component Value Date   [facility] <15 05/02/2024   ETH <15 05/01/2024    Metabolic Disorder Labs: Lab Results  Component Value Date   HGBA1C 5.6 05/14/2024   MPG 114.02 05/14/2024   MPG 116.89 05/02/2024   Lab Results  Component Value Date   PROLACTIN 13.6 04/16/2015   Lab Results  Component Value Date   CHOL 219 (H) 05/14/2024   TRIG 84 05/14/2024   HDL 83 05/14/2024   CHOLHDL 2.6 05/14/2024   VLDL 17 05/14/2024   LDLCALC 119 (H) 05/14/2024  LDLCALC 130 (H) 05/01/2024    Physical Findings: AIMS:  , ,  ,  ,    CIWA:    COWS:      Psychiatric Specialty Exam:  Presentation  General Appearance:  Casual  Eye Contact: Fair  Speech: Normal rate  Speech Volume: Increased    Mood and Affect  Mood: Angry  Affect: Congruent   Thought Process  Thought Processes: Disorganized  Descriptions of Associations: Concrete Orientation:Partial  Thought Content: Discharge focused Hallucinations:RESPONDING TO INTERNAL STIMULI Ideas of Reference:Paranoia; Delusions; Percusatory  Suicidal Thoughts:not endorsing Homicidal Thoughts:nor endorsing  Sensorium  Memory: Recent Poor  Judgment: Poor  Insight: Poor   Executive Functions  Concentration: Poor  Attention Span: Poor  Recall: Poor  Fund of Knowledge: Poor  Language: Poor   Psychomotor Activity  Psychomotor Activity:No data recorded Musculoskeletal: Strength & Muscle Tone: within normal limits Gait & Station: normal Assets  Assets: Resilience    Physical Exam: Physical Exam Vitals and nursing note reviewed.    ROS Blood pressure 103/72, pulse (!) 55, temperature 97.9 F (36.6 C), resp. rate 16, height 5' 4 (1.626 m), weight 44.9 kg, SpO2 100%. Body mass index is 16.99 kg/m.  Diagnosis: Active Problems:   Schizophrenia, paranoid  (HCC) Paranoid  PLAN: Safety and Monitoring:  -- Involuntary admission to inpatient psychiatric unit for safety, stabilization and treatment  -- Daily contact with patient to assess and evaluate symptoms and progress in treatment  -- Patient's case to be discussed in multi-disciplinary team meeting  -- Observation Level : q15 minute checks  -- Vital signs:  q12 hours  -- Precautions: suicide, elopement, and assault -- Encouraged patient to participate in unit milieu and in scheduled group therapies  2. Psychiatric Diagnoses and Treatment: Patient  2 physicians approved non emergent forced medications legal guardian gave consent for Haldol  oral or IM  Zyprexa  15 mg BID  Haldol  10 mg bid added -  LAI haldol  Dec 100mg  Q4 weeks-07/26/2024 DSS gave consent-nonemergent forced medication consented by legal guardian  . Discharge Planning:   -- Social work and case management to assist with discharge planning and identification of hospital follow-up needs prior to discharge  -- Estimated LOS: 3-4 days  Millie JONELLE Manners, MD 08/06/2024, 12:59 PM

## 2024-08-06 NOTE — Group Note (Signed)
 Date:  08/06/2024 Time:  3:43 PM  Group Topic/Focus:  Self Care:   The focus of this group is to help patients understand the importance of self-care in order to improve or restore emotional, physical, spiritual, interpersonal, and financial health.    Participation Level:  Did Not Attend   [name] 08/06/2024, 3:43 PM

## 2024-08-06 NOTE — Progress Notes (Signed)
" °   08/06/24 2200  Psych Admission Type (Psych Patients Only)  Admission Status Voluntary  Psychosocial Assessment  Patient Complaints None  Eye Contact Brief  Facial Expression Flat  Affect Appropriate to circumstance  Speech Soft  Interaction No initiation  Motor Activity Other (Comment) (wdl)  Appearance/Hygiene Poor hygiene  Behavior Characteristics Cooperative;Appropriate to situation  Mood Pleasant  Aggressive Behavior  Effect No apparent injury  Thought Process  Coherency WDL  Content WDL  Delusions None reported or observed  Perception WDL  Hallucination None reported or observed  Judgment Impaired  Confusion None  Danger to Self  Current suicidal ideation? Denies  Agreement Not to Harm Self Yes  Description of Agreement Verbal  Danger to Others  Danger to Others None reported or observed    "

## 2024-08-06 NOTE — Progress Notes (Signed)
" °   08/06/24 0835  Psych Admission Type (Psych Patients Only)  Admission Status Voluntary  Psychosocial Assessment  Patient Complaints None  Eye Contact Brief  Facial Expression Flat  Affect Appropriate to circumstance  Speech Soft  Interaction No initiation  Motor Activity Other (Comment) (WDL)  Appearance/Hygiene In scrubs;Poor hygiene  Behavior Characteristics Cooperative;Calm;Appropriate to situation  Mood Pleasant  Thought Process  Coherency WDL  Content WDL  Delusions None reported or observed  Perception WDL  Hallucination None reported or observed  Judgment Impaired  Confusion None  Danger to Self  Current suicidal ideation? Denies  Agreement Not to Harm Self Yes  Description of Agreement verbal  Danger to Others  Danger to Others None reported or observed   D- Patient alert and oriented x 3. Pt presents with a pleasant mood and affect. Denies SI, HI, AVH, and pain.  A- Scheduled medications administered to patient, per MD orders. Support and encouragement provided.  Routine safety checks conducted every 15 minutes.  Patient informed to notify staff with problems or concerns.  R- No adverse drug reactions noted. Patient contracts for safety at this time. Patient compliant with medications and treatment plan. Patient receptive, calm, and cooperative. Patient did not interact with others on the unit.  Patient remains safe at this time.    Aston Lieske S.,RN "

## 2024-08-06 NOTE — Plan of Care (Signed)
" °  Problem: Coping: Goal: Ability to demonstrate self-control will improve Outcome: Progressing   Problem: Education: Goal: Mental status will improve Outcome: Not Progressing Goal: Verbalization of understanding the information provided will improve Outcome: Not Progressing   Problem: Activity: Goal: Interest or engagement in activities will improve Outcome: Not Progressing   "

## 2024-08-06 NOTE — Plan of Care (Signed)
   Problem: Education: Goal: Knowledge of [name] General Education information/materials will improve Outcome: Progressing Goal: Emotional status will improve Outcome: Progressing Goal: Mental status will improve Outcome: Progressing Goal: Verbalization of understanding the information provided will improve Outcome: Progressing   Problem: Activity: Goal: Interest or engagement in activities will improve Outcome: Progressing Goal: Sleeping patterns will improve Outcome: Progressing   Problem: Coping: Goal: Ability to verbalize frustrations and anger appropriately will improve Outcome: Progressing Goal: Ability to demonstrate self-control will improve Outcome: Progressing

## 2024-08-06 NOTE — Group Note (Signed)
 Date:  08/06/2024 Time:  10:09 AM  Group Topic/Focus:  Rediscovering Joy:   The focus of this group is to explore various ways to relieve stress in a positive manner.    Participation Level:  Did Not Attend  Maglione,Gunther Zawadzki E 08/06/2024, 10:09 AM

## 2024-08-06 NOTE — Progress Notes (Signed)
" °   08/06/24 0000  Psych Admission Type (Psych Patients Only)  Admission Status Involuntary  Psychosocial Assessment  Patient Complaints None  Eye Contact Brief  Facial Expression Flat  Affect Appropriate to circumstance  Speech Soft  Interaction Minimal;Isolative  Motor Activity Restless  Appearance/Hygiene In scrubs  Behavior Characteristics Cooperative  Mood Pleasant  Aggressive Behavior  Effect No apparent injury  Thought Process  Coherency WDL  Content WDL  Delusions None reported or observed  Perception WDL  Hallucination None reported or observed  Judgment Poor  Confusion Mild  Danger to Self  Current suicidal ideation? Denies  Danger to Others  Danger to Others None reported or observed    "

## 2024-08-07 NOTE — Plan of Care (Signed)

## 2024-08-07 NOTE — Progress Notes (Signed)
 Patient quiet but cooperative during interaction. Accepted medications without issue. Unable to assess for SI/HI/AVH. Patient short and unwilling to answer questions. Patient present in dayroom for meals. Isolated to self otherwise. Ongoing monitoring continues.   08/07/24 1100  Psych Admission Type (Psych Patients Only)  Admission Status Voluntary  Psychosocial Assessment  Patient Complaints None  Eye Contact Brief  Facial Expression Flat  Affect Appropriate to circumstance  Speech Soft  Interaction Minimal;Isolative  Motor Activity Other (Comment) (WDL)  Appearance/Hygiene In scrubs;Unremarkable  Behavior Characteristics Cooperative  Mood Pleasant  Thought Process  Coherency WDL  Content WDL  Delusions None reported or observed  Perception WDL  Hallucination None reported or observed  Judgment Impaired  Confusion None  Danger to Self  Current suicidal ideation? Denies  Danger to Others  Danger to Others None reported or observed

## 2024-08-07 NOTE — Group Note (Signed)
 Recreation Therapy Group Note   Group Topic:Coping Skills  Group Date: 08/07/2024 Start Time: 1400 End Time: 1435 Facilitators: Celestia Jeoffrey BRAVO, LRT, CTRS Location: Dayroom  Group Description: Meditation. LRT and patients discussed what they know about meditation and mindfulness. LRT played a Deep Breathing Meditation exercise script for patients to follow along to. LRT and patients discussed how meditation and deep breathing can be used as a coping skill post--discharge to help manage symptoms of stress.   Goal Area(s) Addressed: Patient will practice using relaxation technique. Patient will identify a new coping skill.  Patient will follow multistep directions to reduce anxiety and stress.   Affect/Mood: [name]   Participation Level: Did not attend    Clinical Observations/Individualized Feedback: Patient did not attend.  Plan: Continue to engage patient in RT group sessions 2-3x/week.   Jeoffrey BRAVO Celestia, LRT, CTRS 08/07/2024 4:54 PM

## 2024-08-07 NOTE — Group Note (Signed)
 Date:  08/07/2024 Time:  11:08 AM  Group Topic/Focus:  Personal Choices and Values:   The focus of this group is to help patients assess and explore the importance of values in their lives, how their values affect their decisions, how they express their values and what opposes their expression.    Participation Level:  Did Not Attend   Camellia HERO Larson Limones 08/07/2024, 11:08 AM

## 2024-08-07 NOTE — Plan of Care (Signed)
   Problem: Education: Goal: Emotional status will improve Outcome: Not Progressing Goal: Mental status will improve Outcome: Not Progressing Goal: Verbalization of understanding the information provided will improve Outcome: Not Progressing   Problem: Activity: Goal: Interest or engagement in activities will improve Outcome: Not Progressing

## 2024-08-07 NOTE — Progress Notes (Signed)
 Phone call back [facility] MD Progress Note   12:57 PM Aylee Littrell  MRN:  [id number]  Bertice Risse is a 64 y.o. female admitted: Medicallyfor 05/02/2024 10:56 AM for dehydration, failure to thrive and hypoglycemia. She carries the psychiatric diagnoses of Schizophrenia and has medical hx of hypoglycemia and failure to thrive and has a past medical history of sinus bradycardia, chronic anemia, severe malnutrition and recurrent hypoglycemia.. .    Subjective:  Chart reviewed, case discussed in multidisciplinary meeting, patient seen during rounds.    The patient continues to await appropriate placement. At this time, she remains psychiatrically stable at baseline, with ongoing chronic psychotic symptoms. Continued inpatient care is medically necessary primarily due to placement considerations rather than acute psychiatric decompensation.  On assessment today patient is noted to be standing in her room.  She remains minimally engaged and refuses to answer questions related to mental health.  She is not displaying any self-harm behaviors on the unit.  Per nursing she has fair appetite and sleep.  She is coming out of her room and is noticed to be sitting in the day area more than usual.  She offers no complaints . Past Psychiatric History: see h&P Family History: History reviewed. No pertinent family history. Social History:  Social History   Substance and Sexual Activity  Alcohol Use Not Currently   Comment: Refuses to disclose how much     Social History   Substance and Sexual Activity  Drug Use No   Comment: Refuses to answer    Social History   Socioeconomic History   Marital status: Single    Spouse name: Not on file   Number of children: Not on file   Years of education: Not on file   Highest education level: Not on file  Occupational History   Not on file  Tobacco Use   Smoking status: Every Day    Current packs/day: 2.00    Types: Cigarettes    Passive exposure: Current    Smokeless tobacco: Never  Vaping Use   Vaping status: Never Used  Substance and Sexual Activity   Alcohol use: Not Currently    Comment: Refuses to disclose how much   Drug use: No    Comment: Refuses to answer   Sexual activity: Not Currently    Comment: refused to answer  Other Topics Concern   Not on file  Social History Narrative   Not on file   Social Drivers of Health   Tobacco Use: High Risk (05/06/2024)   Patient History    Smoking Tobacco Use: Every Day    Smokeless Tobacco Use: Never    Passive Exposure: Current  Financial Resource Strain: Not on file  Food Insecurity: Patient Declined (05/06/2024)   Epic    Worried About Programme Researcher, Broadcasting/film/video in the Last Year: Patient declined    Barista in the Last Year: Patient declined  Transportation Needs: Patient Declined (05/06/2024)   Epic    Lack of Transportation (Medical): Patient declined    Lack of Transportation (Non-Medical): Patient declined  Physical Activity: Not on file  Stress: Not on file  Social Connections: Not on file  Depression (PHQ2-9): Not on file  Alcohol Screen: Low Risk (05/06/2024)   Alcohol Screen    Last Alcohol Screening Score (AUDIT): 0  Housing: Unknown (05/06/2024)   Epic    Unable to Pay for Housing in the Last Year: Patient declined    Number of Times Moved in the Last  Year: Not on file    Homeless in the Last Year: Not on file  Utilities: Patient Declined (05/06/2024)   Epic    Threatened with loss of utilities: Patient declined  Health Literacy: Not on file   Past Medical History:  Past Medical History:  Diagnosis Date   Anemia 10/02/2021   Non compliance w medication regimen    Schizophrenia [facility])     Past Surgical History:  Procedure Laterality Date   BIOPSY  09/25/2021   Procedure: BIOPSY;  Surgeon: Teressa Toribio SQUIBB, MD;  Location: [facility];  Service: Gastroenterology;;   ESOPHAGOGASTRODUODENOSCOPY (EGD) WITH PROPOFOL  [name] 09/25/2021   Procedure:  ESOPHAGOGASTRODUODENOSCOPY (EGD) WITH PROPOFOL ;  Surgeon: Teressa Toribio SQUIBB, MD;  Location: [facility];  Service: Gastroenterology;  Laterality: [name];  With NGT placement    Current Medications: Current Facility-Administered Medications  Medication Dose Route Frequency Provider Last Rate Last Admin   acetaminophen  (TYLENOL ) tablet 650 mg  650 mg Oral Q6H PRN Starkes-Perry, Takia S, FNP       alum & mag hydroxide-simeth (MAALOX/MYLANTA) 200-200-20 MG/5ML suspension 30 mL  30 mL Oral Q4H PRN Starkes-Perry, Takia S, FNP       haloperidol  (HALDOL ) tablet 5 mg  5 mg Oral BID Kagan Mutchler, MD   5 mg at 08/07/24 9082   Or   haloperidol  lactate (HALDOL ) injection 10 mg  10 mg Intramuscular BID Lucylle Foulkes, MD       HYDROcodone -acetaminophen  (NORCO/VICODIN) 5-325 MG per tablet 1-2 tablet  1-2 tablet Oral Q4H PRN Starkes-Perry, Takia S, FNP       magnesium  hydroxide (MILK OF MAGNESIA) suspension 30 mL  30 mL Oral Daily PRN Starkes-Perry, Takia S, FNP       multivitamin with minerals tablet 1 tablet  1 tablet Oral Daily Wilkie Majel RAMAN, FNP   1 tablet at 08/07/24 9082   OLANZapine  (ZYPREXA ) injection 5 mg  5 mg Intramuscular TID PRN Wilkie Majel RAMAN, FNP       OLANZapine  (ZYPREXA ) tablet 15 mg  15 mg Oral BID Nole Robey, MD   15 mg at 08/07/24 9082   OLANZapine  zydis (ZYPREXA ) disintegrating tablet 5 mg  5 mg Oral TID PRN Starkes-Perry, Takia S, FNP       ondansetron  (ZOFRAN ) tablet 4 mg  4 mg Oral Q6H PRN Starkes-Perry, Majel RAMAN, FNP       Or   ondansetron  (ZOFRAN ) injection 4 mg  4 mg Intravenous Q6H PRN Starkes-Perry, Majel RAMAN, FNP       thiamine  (VITAMIN B1) tablet 100 mg  100 mg Oral Daily Wilkie Majel RAMAN, FNP   100 mg at 08/07/24 9082   traZODone  (DESYREL ) tablet 50 mg  50 mg Oral QHS Starkes-Perry, Takia S, FNP   50 mg at 08/06/24 2115    Lab Results:  No results found for this or any previous visit (from the past 48 hours).   Blood Alcohol level:  Lab Results   Component Value Date   [facility] <15 05/02/2024   ETH <15 05/01/2024    Metabolic Disorder Labs: Lab Results  Component Value Date   HGBA1C 5.6 05/14/2024   MPG 114.02 05/14/2024   MPG 116.89 05/02/2024   Lab Results  Component Value Date   PROLACTIN 13.6 04/16/2015   Lab Results  Component Value Date   CHOL 219 (H) 05/14/2024   TRIG 84 05/14/2024   HDL 83 05/14/2024   CHOLHDL 2.6 05/14/2024   VLDL 17 05/14/2024   LDLCALC 119 (H) 05/14/2024  LDLCALC 130 (H) 05/01/2024    Physical Findings: AIMS:  , ,  ,  ,    CIWA:    COWS:      Psychiatric Specialty Exam:  Presentation  General Appearance:  Casual  Eye Contact: Fair  Speech: Normal rate  Speech Volume: Increased    Mood and Affect  Mood: Angry  Affect: Congruent   Thought Process  Thought Processes: Disorganized  Descriptions of Associations: Concrete Orientation:Partial  Thought Content: Discharge focused Hallucinations:RESPONDING TO INTERNAL STIMULI Ideas of Reference:Paranoia; Delusions; Percusatory  Suicidal Thoughts:not endorsing Homicidal Thoughts:nor endorsing  Sensorium  Memory: Recent Poor  Judgment: Poor  Insight: Poor   Executive Functions  Concentration: Poor  Attention Span: Poor  Recall: Poor  Fund of Knowledge: Poor  Language: Poor   Psychomotor Activity  Psychomotor Activity:No data recorded Musculoskeletal: Strength & Muscle Tone: within normal limits Gait & Station: normal Assets  Assets: Resilience    Physical Exam: Physical Exam Vitals and nursing note reviewed.    ROS Blood pressure (!) 104/49, pulse (!) 58, temperature (!) 97.2 F (36.2 C), resp. rate 14, height 5' 4 (1.626 m), weight 44.9 kg, SpO2 100%. Body mass index is 16.99 kg/m.  Diagnosis: Active Problems:   Schizophrenia, paranoid (HCC) Paranoid  PLAN: Safety and Monitoring:  -- Involuntary admission to inpatient psychiatric unit for safety, stabilization and  treatment  -- Daily contact with patient to assess and evaluate symptoms and progress in treatment  -- Patient's case to be discussed in multi-disciplinary team meeting  -- Observation Level : q15 minute checks  -- Vital signs:  q12 hours  -- Precautions: suicide, elopement, and assault -- Encouraged patient to participate in unit milieu and in scheduled group therapies  2. Psychiatric Diagnoses and Treatment: Patient  2 physicians approved non emergent forced medications legal guardian gave consent for Haldol  oral or IM  Zyprexa  15 mg BID  Haldol  10 mg bid added -  LAI haldol  Dec 100mg  Q4 weeks-07/26/2024 DSS gave consent-nonemergent forced medication consented by legal guardian  . Discharge Planning:   -- Social work and case management to assist with discharge planning and identification of hospital follow-up needs prior to discharge  -- Estimated LOS: 3-4 days  Allyn Foil, MD 08/07/2024, 12:57 PM

## 2024-08-07 NOTE — Group Note (Signed)
 Physical/Occupational Therapy Group Note  Group Topic: Yoga  Group Date: 08/07/2024 Start Time: 1300 End Time: 1325 Facilitators: Bo Rogue, Alm Hamilton, PT   Group Description: Group participated with series of yoga poses, designed to emphasize functional sitting balance, core stability, generalized flexibility and overall posture.  Incorporated deep breathing techniques with poses, working to promote relaxation, mindfulness and focus with targeted activities.  Discussed benefits of yoga in improving mood and self-esteem, reducing stress and anxiety, and promoting functional strength, balance and core stability for each participant.  Discussed ways to integrate into each participants daily routine.  Provided handout with written and pictorial descriptions of included yoga movements to be utilized as appropriate outside of group time.  Therapeutic Goal(s):  Demonstrate safe ability to participate with yoga poses during group activity. Identify one benefit of participation with yoga poses as part of each participants exercise/movement routine. Identify 1-2 individual poses that participant feels most beneficial to his/her needs and that he/she can easily replicate outside of group.  Individual Participation: Pt was quietly observant during the session with occasional participation during the activity portion of the session. Pt was able to modify activities with min cuing as needed for patient comfort.    Participation Level: Moderate   Participation Quality: Minimal Cues   Behavior: Appropriate   Speech/Thought Process: Min verbal communication during the session    Affect/Mood: Flat   Insight: Moderate   Judgement: Moderate   Modes of Intervention: Activity, Discussion, and Education  Patient Response to Interventions:  Attentive and Engaged with activity portion of the session    Plan: Continue to engage patient in PT/OT groups 1 - 2x/week.  CHARM Hamilton Bertin PT, DPT 08/07/24, 2:02  PM

## 2024-08-07 NOTE — Plan of Care (Signed)
  Problem: Education: Goal: Knowledge of [place] [facility] Education information/materials will improve Outcome: Progressing Goal: Emotional status will improve Outcome: Progressing Goal: Mental status will improve Outcome: Progressing Goal: Verbalization of understanding the information provided will improve Outcome: Progressing   Problem: Activity: Goal: Interest or engagement in activities will improve Outcome: Progressing Goal: Sleeping patterns will improve Outcome: Progressing   Problem: Coping: Goal: Ability to verbalize frustrations and anger appropriately will improve Outcome: Progressing Goal: Ability to demonstrate self-control will improve Outcome: Progressing   Problem: Health Behavior/Discharge Planning: Goal: Identification of resources available to assist in meeting health care needs will improve Outcome: Progressing Goal: Compliance with treatment plan for underlying cause of condition will improve Outcome: Progressing   Problem: Physical Regulation: Goal: Ability to maintain clinical measurements within normal limits will improve Outcome: Progressing   Problem: Safety: Goal: Periods of time without injury will increase Outcome: Progressing   Problem: Education: Goal: Knowledge of General Education information will improve Description: Including pain rating scale, medication(s)/side effects and non-pharmacologic comfort measures Outcome: Progressing   Problem: Health Behavior/Discharge Planning: Goal: Ability to manage health-related needs will improve Outcome: Progressing   Problem: Clinical Measurements: Goal: Ability to maintain clinical measurements within normal limits will improve Outcome: Progressing Goal: Will remain free from infection Outcome: Progressing Goal: Diagnostic test results will improve Outcome: Progressing Goal: Respiratory complications will improve Outcome: Progressing Goal: Cardiovascular complication will be  avoided Outcome: Progressing   Problem: Activity: Goal: Risk for activity intolerance will decrease Outcome: Progressing   Problem: Nutrition: Goal: Adequate nutrition will be maintained Outcome: Progressing   Problem: Coping: Goal: Level of anxiety will decrease Outcome: Progressing   Problem: Elimination: Goal: Will not experience complications related to bowel motility Outcome: Progressing Goal: Will not experience complications related to urinary retention Outcome: Progressing   Problem: Pain Managment: Goal: General experience of comfort will improve and/or be controlled Outcome: Progressing   Problem: Safety: Goal: Ability to remain free from injury will improve Outcome: Progressing   Problem: Skin Integrity: Goal: Risk for impaired skin integrity will decrease Outcome: Progressing   Problem: Health Behavior/Discharge Planning: Goal: Compliance with prescribed medication regimen will improve Outcome: Progressing   Problem: Nutritional: Goal: Ability to achieve adequate nutritional intake will improve Outcome: Progressing

## 2024-08-08 NOTE — Group Note (Signed)
 Date:  08/08/2024 Time:  11:32 PM  Group Topic/Focus:  Wrap-Up Group:   The focus of this group is to help patients review their daily goal of treatment and discuss progress on daily workbooks.  Coping Skills   Participation Level:  Did Not Attend  Participation Quality:  Attentive  Affect:  Not Congruent  Cognitive:  Lacking  Insight: None  Engagement in Group:  Poor  Modes of Intervention:  none  Additional Comments:    Gwendlyn LITTIE Sharps 08/08/2024, 11:32 PM

## 2024-08-08 NOTE — Plan of Care (Signed)
   Problem: Coping: Goal: Ability to verbalize frustrations and anger appropriately will improve Outcome: Progressing Goal: Ability to demonstrate self-control will improve Outcome: Progressing

## 2024-08-08 NOTE — Progress Notes (Signed)
" °   08/07/24 2000  Psych Admission Type (Psych Patients Only)  Admission Status Voluntary  Psychosocial Assessment  Patient Complaints None  Eye Contact Brief  Facial Expression Flat  Affect Appropriate to circumstance  Speech Soft  Interaction Minimal;Isolative  Motor Activity Other (Comment) (steady)  Appearance/Hygiene In scrubs;Unremarkable  Behavior Characteristics Cooperative;Appropriate to situation  Mood Pleasant  Thought Process  Coherency WDL  Content WDL  Delusions None reported or observed  Perception WDL  Hallucination None reported or observed  Judgment Impaired  Confusion None  Danger to Self  Current suicidal ideation? Denies  Self-Injurious Behavior No self-injurious ideation or behavior indicators observed or expressed   Agreement Not to Harm Self Yes  Description of Agreement verbal  Danger to Others  Danger to Others None reported or observed   Mood/Behavior:  Pleasant and cooperative.    Psych assessment: Denies SI/HI and AVH.     Interaction / Group attendance:  Isolative in room. Minimal interaction with peers and staff.  Did not attend group.   Medication/ PRNs: Compliant with scheduled medications. No prns required.    Pain: Denies  15 min checks in place for safety. "

## 2024-08-08 NOTE — Group Note (Signed)
 LCSW Group Therapy Note    Group Date: 08/08/2024 Start Time: 1300 End Time: 1400   Type of Therapy and Topic: Group Therapy: Body Image  Participation Level:  Did Not Attend  Description of Group:  Patients were educated about body image and asked to think about whether they have a healthy or unhealthy body image. Patients were led in a discussion about factors that contribute to body image, both internal and external. Patients were asked to discuss strengths of the human body outside of appearance, such as being able to fight off diseases and provide stress relief. Lastly, patients were asked to identify one way in which they appreciate their own body outside of appearance.   Therapeutic Goals:   1. Patient will differentiate between a healthy and unhealthy body image. 2. Patient will identify what contributes to body image 3. Patient will discuss the strengths of the human body. 4. Patient will identify a positive attribute of their body outside of physical appearance.  Summary of Patient Progress:  Patient did not attend.   Therapeutic Modalities: Cognitive Behavioral Therapy; Solution-Focused Therapy  Endiya Klahr M Octavian Godek, LCSWA 08/08/2024  2:43 PM

## 2024-08-08 NOTE — Group Note (Signed)
 Date:  08/08/2024 Time:  4:08 PM  Group Topic/Focus:  Managing Feelings:   The focus of this group is to identify what feelings patients have difficulty handling and develop a plan to handle them in a healthier way upon discharge.  Meditation is excellent for elderly patients as it improves cognitive health, reduces stress, manages chronic pain, boosts mood, enhances sleep, and increases emotional resilience, helping combat age-related issues like memory loss, anxiety, and loneliness by promoting brain plasticity and a sense of calm, making it a simple, adaptable tool for better overall quality of life.   Participation Level:  Active  Participation Quality:  Appropriate  Affect:  Appropriate  Cognitive:  Appropriate  Insight: Appropriate  Engagement in Group:  Engaged  Modes of Intervention:  Activity  Additional Comments:  [name]  Harlene LITTIE Gavel 08/08/2024, 4:08 PM

## 2024-08-08 NOTE — Group Note (Signed)
 Recreation Therapy Group Note   Group Topic:Animal Assisted Therapy   Group Date: 08/08/2024 Start Time: 1030 End Time: 1100 Facilitators: Celestia Jeoffrey BRAVO, LRT, CTRS Location: Dayroom  Group Description: AAA. Animal-Assisted Activity provides opportunities for motivational, educational, therapeutic and/or recreational benefits to enhance quality of life. Selinda and Rollo visited the unit to interact with patients.   Goal Areas Addressed:  Reduced anxiety and stress Improved mood Increased social interaction Enhanced communication skills Reduced loneliness and isolation Improved emotional regulation   Affect/Mood: [name]   Participation Level: Did not attend    Clinical Observations/Individualized Feedback: Patient did not attend.  Plan: Continue to engage patient in RT group sessions 2-3x/week.   Jeoffrey BRAVO Celestia, LRT, CTRS 08/08/2024 11:48 AM

## 2024-08-08 NOTE — Progress Notes (Signed)
 Phone call back [facility] MD Progress Note   10:26 PM [name]  MRN:  [id number]  [name] is a 64 y.o. female admitted: Medicallyfor 05/02/2024 10:56 AM for dehydration, failure to thrive and hypoglycemia. She carries the psychiatric diagnoses of Schizophrenia and has medical hx of hypoglycemia and failure to thrive and has a past medical history of sinus bradycardia, chronic anemia, severe malnutrition and recurrent hypoglycemia.. .    Subjective:  Chart reviewed, case discussed in multidisciplinary meeting, patient seen during rounds.   Patient is noted to be resting in bed.  She offers no complaints.  She denies any safety concerns and denies SI/HI/plan and denies hallucinations.  She remains delusional about not needing any mental health help and remains discharge focused.  Per nursing patient is visible on the unit and attends some groups and is maintaining her hydration and nutrition Past Psychiatric History: see h&P Family History: History reviewed. No pertinent family history. Social History:  Social History   Substance and Sexual Activity  Alcohol Use Not Currently   Comment: Refuses to disclose how much     Social History   Substance and Sexual Activity  Drug Use No   Comment: Refuses to answer    Social History   Socioeconomic History   Marital status: Single    Spouse name: Not on file   Number of children: Not on file   Years of education: Not on file   Highest education level: Not on file  Occupational History   Not on file  Tobacco Use   Smoking status: Every Day    Current packs/day: 2.00    Types: Cigarettes    Passive exposure: Current   Smokeless tobacco: Never  Vaping Use   Vaping status: Never Used  Substance and Sexual Activity   Alcohol use: Not Currently    Comment: Refuses to disclose how much   Drug use: No    Comment: Refuses to answer   Sexual activity: Not Currently    Comment: refused to answer  Other Topics Concern   Not on file   Social History Narrative   Not on file   Social Drivers of Health   Tobacco Use: High Risk (05/06/2024)   Patient History    Smoking Tobacco Use: Every Day    Smokeless Tobacco Use: Never    Passive Exposure: Current  Financial Resource Strain: Not on file  Food Insecurity: Patient Declined (05/06/2024)   Epic    Worried About Programme Researcher, Broadcasting/film/video in the Last Year: Patient declined    Barista in the Last Year: Patient declined  Transportation Needs: Patient Declined (05/06/2024)   Epic    Lack of Transportation (Medical): Patient declined    Lack of Transportation (Non-Medical): Patient declined  Physical Activity: Not on file  Stress: Not on file  Social Connections: Not on file  Depression (PHQ2-9): Not on file  Alcohol Screen: Low Risk (05/06/2024)   Alcohol Screen    Last Alcohol Screening Score (AUDIT): 0  Housing: Unknown (05/06/2024)   Epic    Unable to Pay for Housing in the Last Year: Patient declined    Number of Times Moved in the Last Year: Not on file    Homeless in the Last Year: Not on file  Utilities: Patient Declined (05/06/2024)   Epic    Threatened with loss of utilities: Patient declined  Health Literacy: Not on file   Past Medical History:  Past Medical History:  Diagnosis Date  Anemia 10/02/2021   Non compliance w medication regimen    Schizophrenia [facility])     Past Surgical History:  Procedure Laterality Date   BIOPSY  09/25/2021   Procedure: BIOPSY;  Surgeon: Teressa Toribio SQUIBB, MD;  Location: [facility];  Service: Gastroenterology;;   ESOPHAGOGASTRODUODENOSCOPY (EGD) WITH PROPOFOL  [name] 09/25/2021   Procedure: ESOPHAGOGASTRODUODENOSCOPY (EGD) WITH PROPOFOL ;  Surgeon: Teressa Toribio SQUIBB, MD;  Location: [facility];  Service: Gastroenterology;  Laterality: [name];  With NGT placement    Current Medications: Current Facility-Administered Medications  Medication Dose Route Frequency Provider Last Rate Last Admin   acetaminophen  (TYLENOL ) tablet  650 mg  650 mg Oral Q6H PRN Starkes-Perry, Takia S, FNP       alum & mag hydroxide-simeth (MAALOX/MYLANTA) 200-200-20 MG/5ML suspension 30 mL  30 mL Oral Q4H PRN Starkes-Perry, Takia S, FNP       haloperidol  (HALDOL ) tablet 5 mg  5 mg Oral BID Leilani Cespedes, MD   5 mg at 08/08/24 2107   Or   haloperidol  lactate (HALDOL ) injection 10 mg  10 mg Intramuscular BID Nereida Schepp, MD       HYDROcodone -acetaminophen  (NORCO/VICODIN) 5-325 MG per tablet 1-2 tablet  1-2 tablet Oral Q4H PRN Starkes-Perry, Takia S, FNP       magnesium  hydroxide (MILK OF MAGNESIA) suspension 30 mL  30 mL Oral Daily PRN Starkes-Perry, Takia S, FNP       multivitamin with minerals tablet 1 tablet  1 tablet Oral Daily Wilkie Majel RAMAN, FNP   1 tablet at 08/08/24 9065   OLANZapine  (ZYPREXA ) injection 5 mg  5 mg Intramuscular TID PRN Wilkie Majel RAMAN, FNP       OLANZapine  (ZYPREXA ) tablet 15 mg  15 mg Oral BID Shavana Calder, MD   15 mg at 08/08/24 2107   OLANZapine  zydis (ZYPREXA ) disintegrating tablet 5 mg  5 mg Oral TID PRN Wilkie Majel RAMAN, FNP       ondansetron  (ZOFRAN ) tablet 4 mg  4 mg Oral Q6H PRN Wilkie Majel RAMAN, FNP       Or   ondansetron  (ZOFRAN ) injection 4 mg  4 mg Intravenous Q6H PRN Starkes-Perry, Majel RAMAN, FNP       thiamine  (VITAMIN B1) tablet 100 mg  100 mg Oral Daily Wilkie Majel RAMAN, FNP   100 mg at 08/08/24 9065   traZODone  (DESYREL ) tablet 50 mg  50 mg Oral QHS Starkes-Perry, Takia S, FNP   50 mg at 08/08/24 2107    Lab Results:  No results found for this or any previous visit (from the past 48 hours).   Blood Alcohol level:  Lab Results  Component Value Date   [facility] <15 05/02/2024   ETH <15 05/01/2024    Metabolic Disorder Labs: Lab Results  Component Value Date   HGBA1C 5.6 05/14/2024   MPG 114.02 05/14/2024   MPG 116.89 05/02/2024   Lab Results  Component Value Date   PROLACTIN 13.6 04/16/2015   Lab Results  Component Value Date   CHOL 219 (H)  05/14/2024   TRIG 84 05/14/2024   HDL 83 05/14/2024   CHOLHDL 2.6 05/14/2024   VLDL 17 05/14/2024   LDLCALC 119 (H) 05/14/2024   LDLCALC 130 (H) 05/01/2024    Physical Findings: AIMS:  , ,  ,  ,    CIWA:    COWS:      Psychiatric Specialty Exam:  Presentation  General Appearance:  Casual  Eye Contact: Fair  Speech: Normal rate  Speech Volume: Increased  Mood and Affect  Mood: Angry  Affect: Congruent   Thought Process  Thought Processes: Disorganized  Descriptions of Associations: Concrete Orientation:Partial  Thought Content: Discharge focused Hallucinations:RESPONDING TO INTERNAL STIMULI Ideas of Reference:Paranoia; Delusions; Percusatory  Suicidal Thoughts:not endorsing Homicidal Thoughts:nor endorsing  Sensorium  Memory: Recent Poor  Judgment: Poor  Insight: Poor   Executive Functions  Concentration: Poor  Attention Span: Poor  Recall: Poor  Fund of Knowledge: Poor  Language: Poor   Psychomotor Activity  Psychomotor Activity:No data recorded Musculoskeletal: Strength & Muscle Tone: within normal limits Gait & Station: normal Assets  Assets: Resilience    Physical Exam: Physical Exam Vitals and nursing note reviewed.    ROS Blood pressure 114/72, pulse 64, temperature 97.7 F (36.5 C), resp. rate 14, height 5' 4 (1.626 m), weight 44.9 kg, SpO2 99%. Body mass index is 16.99 kg/m.  Diagnosis: Active Problems:   Schizophrenia, paranoid (HCC) Paranoid  PLAN: Safety and Monitoring:  -- Involuntary admission to inpatient psychiatric unit for safety, stabilization and treatment  -- Daily contact with patient to assess and evaluate symptoms and progress in treatment  -- Patient's case to be discussed in multi-disciplinary team meeting  -- Observation Level : q15 minute checks  -- Vital signs:  q12 hours  -- Precautions: suicide, elopement, and assault -- Encouraged patient to participate in unit milieu  and in scheduled group therapies  2. Psychiatric Diagnoses and Treatment: Patient  2 physicians approved non emergent forced medications legal guardian gave consent for Haldol  oral or IM  Zyprexa  15 mg BID  Haldol  10 mg bid added -  LAI haldol  Dec 100mg  Q4 weeks-07/26/2024 DSS gave consent-nonemergent forced medication consented by legal guardian  . Discharge Planning:   -- Social work and case management to assist with discharge planning and identification of hospital follow-up needs prior to discharge  -- Estimated LOS: 3-4 days  Allyn Foil, MD 08/08/2024, 10:26 PM

## 2024-08-08 NOTE — Progress Notes (Signed)
" °   08/08/24 1100  Psych Admission Type (Psych Patients Only)  Admission Status Voluntary  Psychosocial Assessment  Patient Complaints None  Eye Contact Brief  Facial Expression Flat  Affect Appropriate to circumstance  Speech Soft  Interaction Minimal  Motor Activity Other (Comment)  Appearance/Hygiene In scrubs  Behavior Characteristics Cooperative  Mood Pleasant  Thought Process  Coherency WDL  Content WDL  Delusions None reported or observed  Perception WDL  Hallucination None reported or observed  Judgment Impaired  Confusion None  Danger to Self  Current suicidal ideation? Denies  Danger to Others  Danger to Others None reported or observed    "

## 2024-08-08 NOTE — BH IP Treatment Plan (Signed)
 Interdisciplinary Treatment and Diagnostic Plan Update  08/08/2024 Time of Session: 2:45pm [name] MRN: [id number]  Principal Diagnosis: <principal problem not specified>  Secondary Diagnoses: Active Problems:   Schizophrenia, paranoid (HCC)   Current Medications:  Current Facility-Administered Medications  Medication Dose Route Frequency Provider Last Rate Last Admin   acetaminophen  (TYLENOL ) tablet 650 mg  650 mg Oral Q6H PRN Starkes-Perry, Takia S, FNP       alum & mag hydroxide-simeth (MAALOX/MYLANTA) 200-200-20 MG/5ML suspension 30 mL  30 mL Oral Q4H PRN Starkes-Perry, Takia S, FNP       haloperidol  (HALDOL ) tablet 5 mg  5 mg Oral BID Jadapalle, Sree, MD   5 mg at 08/08/24 9065   Or   haloperidol  lactate (HALDOL ) injection 10 mg  10 mg Intramuscular BID Jadapalle, Sree, MD       HYDROcodone -acetaminophen  (NORCO/VICODIN) 5-325 MG per tablet 1-2 tablet  1-2 tablet Oral Q4H PRN Starkes-Perry, Takia S, FNP       magnesium  hydroxide (MILK OF MAGNESIA) suspension 30 mL  30 mL Oral Daily PRN Starkes-Perry, Takia S, FNP       multivitamin with minerals tablet 1 tablet  1 tablet Oral Daily Wilkie Majel RAMAN, FNP   1 tablet at 08/08/24 9065   OLANZapine  (ZYPREXA ) injection 5 mg  5 mg Intramuscular TID PRN Wilkie Majel RAMAN, FNP       OLANZapine  (ZYPREXA ) tablet 15 mg  15 mg Oral BID Jadapalle, Sree, MD   15 mg at 08/08/24 9065   OLANZapine  zydis (ZYPREXA ) disintegrating tablet 5 mg  5 mg Oral TID PRN Starkes-Perry, Takia S, FNP       ondansetron  (ZOFRAN ) tablet 4 mg  4 mg Oral Q6H PRN Starkes-Perry, Majel RAMAN, FNP       Or   ondansetron  (ZOFRAN ) injection 4 mg  4 mg Intravenous Q6H PRN Starkes-Perry, Majel RAMAN, FNP       thiamine  (VITAMIN B1) tablet 100 mg  100 mg Oral Daily Starkes-Perry, Takia S, FNP   100 mg at 08/08/24 9065   traZODone  (DESYREL ) tablet 50 mg  50 mg Oral QHS Starkes-Perry, Takia S, FNP   50 mg at 08/07/24 2120   PTA Medications: Medications Prior to Admission   Medication Sig Dispense Refill Last Dose/Taking   feeding supplement (ENSURE PLUS HIGH PROTEIN) LIQD Take 237 mLs by mouth 3 (three) times daily between meals.      Multiple Vitamin (MULTIVITAMIN WITH MINERALS) TABS tablet Take 1 tablet by mouth daily.      OLANZapine  (ZYPREXA ) 5 MG tablet Take 1 tablet (5 mg total) by mouth 2 (two) times daily.      OLANZapine  (ZYPREXA ) injection Inject 1 mL (5 mg total) into the muscle 2 (two) times daily.      thiamine  (VITAMIN B-1) 100 MG tablet Take 1 tablet (100 mg total) by mouth daily.      traZODone  (DESYREL ) 50 MG tablet Take 1 tablet (50 mg total) by mouth at bedtime.       Patient Stressors:    Patient Strengths:    Treatment Modalities: Medication Management, Group therapy, Case management,  1 to 1 session with clinician, Psychoeducation, Recreational therapy.   Physician Treatment Plan for Primary Diagnosis: <principal problem not specified> Long Term Goal(s): Improvement in symptoms so as ready for discharge   Short Term Goals: Ability to identify changes in lifestyle to reduce recurrence of condition will improve  Medication Management: Evaluate patient's response, side effects, and tolerance of medication regimen.  Therapeutic  Interventions: 1 to 1 sessions, Unit Group sessions and Medication administration.  Evaluation of Outcomes: Progressing  Physician Treatment Plan for Secondary Diagnosis: Active Problems:   Schizophrenia, paranoid (HCC)  Long Term Goal(s): Improvement in symptoms so as ready for discharge   Short Term Goals: Ability to identify changes in lifestyle to reduce recurrence of condition will improve     Medication Management: Evaluate patient's response, side effects, and tolerance of medication regimen.  Therapeutic Interventions: 1 to 1 sessions, Unit Group sessions and Medication administration.  Evaluation of Outcomes: Progressing   RN Treatment Plan for Primary Diagnosis: <principal problem not  specified> Long Term Goal(s): Knowledge of disease and therapeutic regimen to maintain health will improve  Short Term Goals: Ability to demonstrate self-control, Ability to participate in decision making will improve, Ability to verbalize feelings will improve, Ability to disclose and discuss suicidal ideas, Ability to identify and develop effective coping behaviors will improve, and Compliance with prescribed medications will improve  Medication Management: RN will administer medications as ordered by provider, will assess and evaluate patient's response and provide education to patient for prescribed medication. RN will report any adverse and/or side effects to prescribing provider.  Therapeutic Interventions: 1 on 1 counseling sessions, Psychoeducation, Medication administration, Evaluate responses to treatment, Monitor vital signs and CBGs as ordered, Perform/monitor CIWA, COWS, AIMS and Fall Risk screenings as ordered, Perform wound care treatments as ordered.  Evaluation of Outcomes: Progressing   LCSW Treatment Plan for Primary Diagnosis: <principal problem not specified> Long Term Goal(s): Safe transition to appropriate next level of care at discharge, Engage patient in therapeutic group addressing interpersonal concerns.  Short Term Goals: Engage patient in aftercare planning with referrals and resources, Increase social support, Increase ability to appropriately verbalize feelings, Increase emotional regulation, Facilitate acceptance of mental health diagnosis and concerns, and Increase skills for wellness and recovery  Therapeutic Interventions: Assess for all discharge needs, 1 to 1 time with Social worker, Explore available resources and support systems, Assess for adequacy in community support network, Educate family and significant other(s) on suicide prevention, Complete Psychosocial Assessment, Interpersonal group therapy.  Evaluation of Outcomes: Progressing   Progress in  Treatment: Attending groups: No. Participating in groups: No. Taking medication as prescribed: Yes. Toleration medication: Yes. Family/Significant other contact made: Yes, individual(s) contacted:  SPE completed with the patient and DSS Patient understands diagnosis: No. Discussing patient identified problems/goals with staff: No. Medical problems stabilized or resolved: Yes. Denies suicidal/homicidal ideation: Yes. Issues/concerns per patient self-inventory: No. Other: none  New problem(s) identified: No, Describe:  none   New Short Term/Long Term Goal(s):elimination of symptoms of psychosis, medication management for mood stabilization; elimination of SI thoughts; development of comprehensive mental wellness plan. Update: 05/13/2024 No changes at this time.Update: 05/18/2024 No changes at this time. Update 05/23/2024:  No changes at this time. Update 05/28/2024:  No changes at this time. Update 06/02/2024:  No changes at this time. Update 06/07/2024:  No changes at this time. 06/12/24 Update: No changes at this time. Update 06/18/24:  No changes at this time  Update 06/24/24:  No changes at this time Update 06/29/24:  No changes at this time 07/04/24 Update: No changes. 07/09/24: No change 07/14/2024: No changes at this time.  Update 07/19/24: No changes at this time Update 07/25/23: No changes at this time Update 07/29/24 no changes  Update 08/03/2024:  No changes at this time.  Update 08/08/2024: No changes at this time.    Patient Goals:    I just  need to leave, I don't need it hereUpdate: 05/13/2024 No changes at this time.Update: 05/18/2024 No changes at this time. Update 05/23/2024:  No changes at this time.Update 05/28/2024:  No changes at this time. Update 06/02/2024:  No changes at this time. Update 06/07/2024:  No changes at this time. 06/12/24 Update: No changes at this time. Update 06/18/24:  No changes at this time Update 06/24/24:  No changes at this time Update 06/29/24:  No changes  at this time 07/04/24 Update: No changes. 07/09/24: No changes at this time.  07/14/2024: No changes at this time.   Update 07/19/24: No changes at this time Update 07/25/23: No changes at this time Update: 07/29/24  Update 08/03/2024:  No changes at this time. Update 08/08/2024: No changes at this time.     Discharge Plan or Barriers:  CSW will assist with appropriate discharge planning. Update: 05/13/2024 No changes at this time.Update: 05/18/2024 No changes at this time. Update 05/23/2024:  No changes at this time.Update 05/28/2024:  CSW unable to identify appropriate decision maker, as pt's legal guardian has passed and CSW has reached out to APS caseworker multiple times to get clarity on protocol for enforcing medication until an appropriate party has been legal identified Update 06/02/2024:  No changes at this time. Update 06/07/2024:  CSW and provider still working to clarify appropriate horticulturist, commercial. Please refer to extensive notes in medical record. 06/12/24 Update: CSW and provider still working with DSS to obtain legal decision maker on patient's behalf. Update 06/18/24:  No changes at this time Update 06/24/24:  DSS received ex-parte, DSS to begin looking for placement, Dr.J to initiate enforced medication Update 06/29/24:  No changes at this time 07/04/24 Update: CSW to continue to provide support to APS case worker while DSS works on clinical cytogeneticist. No changes. 07/09/24 07/14/2024: Possible bed at [facility], CSW team to follow up with guardian regarding this.   Update 07/19/24: No changes at this time  Update 07/25/23: No changes at this time  Update 07/29/24 No changes at this time.  Update 08/03/2024:  Placement is still being sought.  Update 08/08/2024: Placement remains a barrier at this time.    Reason for Continuation of Hospitalization: Aggression Hallucinations   Estimated Length of Stay:TBD   Update 08/03/2024:  TBD Update 08/08/2024: TBD  Last 3 [place] Suicide  Severity Risk Score: Flowsheet Row Admission (Current) from 05/06/2024 in [facility] ED to Hosp-Admission (Discharged) from 05/02/2024 in [facility] ED from 04/30/2024 in [facility]  C-SSRS RISK CATEGORY No Risk No Risk No Risk    Last [facility] 2/9 Scores:     No data to display          Scribe for Treatment Team: Sherryle JINNY Paola KEN 08/08/2024 3:44 PM

## 2024-08-08 NOTE — Group Note (Signed)
 Date:  08/08/2024 Time:  11:23 PM  Group Topic/Focus:  Personal Choices and Values:   The focus of this group is to help patients assess and explore the importance of values in their lives, how their values affect their decisions, how they express their values and what opposes their expression.    Participation Level:  Did Not Attend  Participation Quality:  Attentive  Affect:  Not Congruent  Cognitive:  Lacking  Insight: None  Engagement in Group:  None  Modes of Intervention:  Support and    Additional Comments:    Gwendlyn LITTIE Sharps 08/08/2024, 11:23 PM

## 2024-08-08 NOTE — Group Note (Signed)
 Date:  08/08/2024 Time:  11:20 PM  Group Topic/Focus:  Wrap-Up Group:   The focus of this group is to help patients review their daily goal of treatment and discuss progress on daily workbooks.    Participation Level:  Did Not Attend  Participation Quality:     Affect:     Cognitive:     Insight: None  Engagement in Group:  None  Modes of Intervention:     Additional Comments:    [name] 08/08/2024, 11:20 PM

## 2024-08-08 NOTE — BHH Counselor (Signed)
 CSW contacted Nat Chang, [place] [employer] [facility], APS.    She reports that to her knowledge Miss Julien is trying to visit the patient.  CSW inquired if Etha was referring to [facility] and it was confirmed that she was.  CSW explained that Oddis has visited the patient several weeks ago. She reports that Oddis is attempting to revisit the patient.   She reports that she has not been able to identify any additional potential beds for placement.  CSW explained that she has the contact information for Cierra and will reach out.   CSW called Cierra, however, no answer and CSW left a HIPAA compliant voicemail.   Sherryle Margo, MSW, LCSW 08/08/2024 10:35 AM

## 2024-08-09 NOTE — Plan of Care (Signed)
   Problem: Education: Goal: Emotional status will improve Outcome: Progressing Goal: Mental status will improve Outcome: Progressing Goal: Verbalization of understanding the information provided will improve Outcome: Progressing   Problem: Activity: Goal: Interest or engagement in activities will improve Outcome: Progressing Goal: Sleeping patterns will improve Outcome: Progressing

## 2024-08-09 NOTE — Plan of Care (Signed)
   Problem: Activity: Goal: Interest or engagement in activities will improve Outcome: Progressing Goal: Sleeping patterns will improve Outcome: Progressing

## 2024-08-09 NOTE — Progress Notes (Signed)
" °   08/09/24 1500  Psych Admission Type (Psych Patients Only)  Admission Status Involuntary  Psychosocial Assessment  Patient Complaints None  Eye Contact Brief  Facial Expression Flat  Affect Appropriate to circumstance  Speech Soft  Interaction Minimal  Motor Activity Other (Comment)  Appearance/Hygiene In scrubs  Behavior Characteristics Guarded  Mood Pleasant  Thought Process  Coherency WDL  Content WDL  Delusions None reported or observed  Perception WDL  Hallucination None reported or observed  Judgment Impaired  Confusion None  Danger to Self  Current suicidal ideation? Denies  Danger to Others  Danger to Others None reported or observed    "

## 2024-08-09 NOTE — BHH Counselor (Signed)
 CSW received call from Nat Chang regarding pt's disposition. Nat reports that Ms.Singletary from [facility] would like to come out to visit pt again prior to accepting.   Nat also reports that she has a few other options available to pt that she is looking at as well if this placement falls through.   CSW informed care team.   [name] Croft, MSW, [facility] 08/09/2024 4:14 PM

## 2024-08-09 NOTE — Group Note (Signed)
 Date:  08/09/2024 Time:  11:46 AM  Group Topic/Focus:  Group Exercise    Participation Level:  Did Not Attend  Participation Quality:  Did Not Attend  Affect:  Did Not Attend   Cognitive:  Did Not Attend   Insight: Did Not Attend   Engagement in Group:  Did Not Attend   Modes of Intervention:  Did Not Attend   Additional Comments:  Did Not Attend   [name] E [name] 08/09/2024, 11:46 AM

## 2024-08-09 NOTE — Progress Notes (Addendum)
 Pt A&Ox3. Disoriented to situation. She denies SI/HI/AVH. Isolative to bedroom. Med compliant. She slept well throughout the night. Q 15 min safety checks in place.    08/08/24 2107  Psych Admission Type (Psych Patients Only)  Admission Status Involuntary  Psychosocial Assessment  Patient Complaints None  Eye Contact Brief  Facial Expression Sullen  Affect Sullen  Speech Soft  Interaction Isolative;Minimal  Motor Activity Slow  Appearance/Hygiene In scrubs  Behavior Characteristics Guarded  Mood Preoccupied  Thought Process  Coherency WDL  Content WDL  Delusions None reported or observed  Perception WDL  Hallucination None reported or observed  Judgment Impaired  Confusion Mild  Danger to Self  Current suicidal ideation? Denies  Danger to Others  Danger to Others None reported or observed

## 2024-08-09 NOTE — Progress Notes (Signed)
 Phone call back [facility] MD Progress Note   12:25 PM [name]  MRN:  [id number]  [name] is a 64 y.o. female admitted: Medicallyfor 05/02/2024 10:56 AM for dehydration, failure to thrive and hypoglycemia. She carries the psychiatric diagnoses of Schizophrenia and has medical hx of hypoglycemia and failure to thrive and has a past medical history of sinus bradycardia, chronic anemia, severe malnutrition and recurrent hypoglycemia.. .    Subjective:  Chart reviewed, case discussed in multidisciplinary meeting, patient seen during rounds.    Patient is noted to be standing in her room.  She minimally engages in the interview.  Before the provider entered the room she was noted to be talking to herself and mumbling.  Patient does not respond to questions related to SI/HI and hallucinations.  Per nursing patient comes out for groups and meals and she is taking her medications with no reported side effects   Past Psychiatric History: see h&P Family History: History reviewed. No pertinent family history. Social History:  Social History   Substance and Sexual Activity  Alcohol Use Not Currently   Comment: Refuses to disclose how much     Social History   Substance and Sexual Activity  Drug Use No   Comment: Refuses to answer    Social History   Socioeconomic History   Marital status: Single    Spouse name: Not on file   Number of children: Not on file   Years of education: Not on file   Highest education level: Not on file  Occupational History   Not on file  Tobacco Use   Smoking status: Every Day    Current packs/day: 2.00    Types: Cigarettes    Passive exposure: Current   Smokeless tobacco: Never  Vaping Use   Vaping status: Never Used  Substance and Sexual Activity   Alcohol use: Not Currently    Comment: Refuses to disclose how much   Drug use: No    Comment: Refuses to answer   Sexual activity: Not Currently    Comment: refused to answer  Other Topics Concern    Not on file  Social History Narrative   Not on file   Social Drivers of Health   Tobacco Use: High Risk (05/06/2024)   Patient History    Smoking Tobacco Use: Every Day    Smokeless Tobacco Use: Never    Passive Exposure: Current  Financial Resource Strain: Not on file  Food Insecurity: Patient Declined (05/06/2024)   Epic    Worried About Programme Researcher, Broadcasting/film/video in the Last Year: Patient declined    Barista in the Last Year: Patient declined  Transportation Needs: Patient Declined (05/06/2024)   Epic    Lack of Transportation (Medical): Patient declined    Lack of Transportation (Non-Medical): Patient declined  Physical Activity: Not on file  Stress: Not on file  Social Connections: Not on file  Depression (PHQ2-9): Not on file  Alcohol Screen: Low Risk (05/06/2024)   Alcohol Screen    Last Alcohol Screening Score (AUDIT): 0  Housing: Unknown (05/06/2024)   Epic    Unable to Pay for Housing in the Last Year: Patient declined    Number of Times Moved in the Last Year: Not on file    Homeless in the Last Year: Not on file  Utilities: Patient Declined (05/06/2024)   Epic    Threatened with loss of utilities: Patient declined  Health Literacy: Not on file   Past Medical  History:  Past Medical History:  Diagnosis Date   Anemia 10/02/2021   Non compliance w medication regimen    Schizophrenia [facility])     Past Surgical History:  Procedure Laterality Date   BIOPSY  09/25/2021   Procedure: BIOPSY;  Surgeon: Teressa Toribio SQUIBB, MD;  Location: [facility];  Service: Gastroenterology;;   ESOPHAGOGASTRODUODENOSCOPY (EGD) WITH PROPOFOL  [name] 09/25/2021   Procedure: ESOPHAGOGASTRODUODENOSCOPY (EGD) WITH PROPOFOL ;  Surgeon: Teressa Toribio SQUIBB, MD;  Location: [facility];  Service: Gastroenterology;  Laterality: [name];  With NGT placement    Current Medications: Current Facility-Administered Medications  Medication Dose Route Frequency Provider Last Rate Last Admin   acetaminophen   (TYLENOL ) tablet 650 mg  650 mg Oral Q6H PRN Starkes-Perry, Takia S, FNP       alum & mag hydroxide-simeth (MAALOX/MYLANTA) 200-200-20 MG/5ML suspension 30 mL  30 mL Oral Q4H PRN Starkes-Perry, Takia S, FNP       haloperidol  (HALDOL ) tablet 5 mg  5 mg Oral BID Tequilla Cousineau, MD   5 mg at 08/09/24 9068   Or   haloperidol  lactate (HALDOL ) injection 10 mg  10 mg Intramuscular BID Monigue Spraggins, MD       HYDROcodone -acetaminophen  (NORCO/VICODIN) 5-325 MG per tablet 1-2 tablet  1-2 tablet Oral Q4H PRN Starkes-Perry, Takia S, FNP       magnesium  hydroxide (MILK OF MAGNESIA) suspension 30 mL  30 mL Oral Daily PRN Starkes-Perry, Takia S, FNP       multivitamin with minerals tablet 1 tablet  1 tablet Oral Daily Wilkie Majel RAMAN, FNP   1 tablet at 08/09/24 9066   OLANZapine  (ZYPREXA ) injection 5 mg  5 mg Intramuscular TID PRN Wilkie Majel RAMAN, FNP       OLANZapine  (ZYPREXA ) tablet 15 mg  15 mg Oral BID Kristina Mcnorton, MD   15 mg at 08/09/24 0932   OLANZapine  zydis (ZYPREXA ) disintegrating tablet 5 mg  5 mg Oral TID PRN Starkes-Perry, Takia S, FNP       ondansetron  (ZOFRAN ) tablet 4 mg  4 mg Oral Q6H PRN Starkes-Perry, Majel RAMAN, FNP       Or   ondansetron  (ZOFRAN ) injection 4 mg  4 mg Intravenous Q6H PRN Starkes-Perry, Majel RAMAN, FNP       thiamine  (VITAMIN B1) tablet 100 mg  100 mg Oral Daily Wilkie Majel RAMAN, FNP   100 mg at 08/09/24 0932   traZODone  (DESYREL ) tablet 50 mg  50 mg Oral QHS Starkes-Perry, Takia S, FNP   50 mg at 08/08/24 2107    Lab Results:  No results found for this or any previous visit (from the past 48 hours).   Blood Alcohol level:  Lab Results  Component Value Date   [facility] <15 05/02/2024   ETH <15 05/01/2024    Metabolic Disorder Labs: Lab Results  Component Value Date   HGBA1C 5.6 05/14/2024   MPG 114.02 05/14/2024   MPG 116.89 05/02/2024   Lab Results  Component Value Date   PROLACTIN 13.6 04/16/2015   Lab Results  Component Value Date   CHOL  219 (H) 05/14/2024   TRIG 84 05/14/2024   HDL 83 05/14/2024   CHOLHDL 2.6 05/14/2024   VLDL 17 05/14/2024   LDLCALC 119 (H) 05/14/2024   LDLCALC 130 (H) 05/01/2024    Physical Findings: AIMS:  , ,  ,  ,    CIWA:    COWS:      Psychiatric Specialty Exam:  Presentation  General Appearance:  Casual  Eye  Contact: Fair  Speech: Normal rate  Speech Volume: Increased    Mood and Affect  Mood: Angry  Affect: Congruent   Thought Process  Thought Processes: Disorganized  Descriptions of Associations: Concrete Orientation:Partial  Thought Content: Discharge focused Hallucinations:RESPONDING TO INTERNAL STIMULI Ideas of Reference:Paranoia; Delusions; Percusatory  Suicidal Thoughts:not endorsing Homicidal Thoughts:nor endorsing  Sensorium  Memory: Recent Poor  Judgment: Poor  Insight: Poor   Executive Functions  Concentration: Poor  Attention Span: Poor  Recall: Poor  Fund of Knowledge: Poor  Language: Poor   Psychomotor Activity  Psychomotor Activity:No data recorded Musculoskeletal: Strength & Muscle Tone: within normal limits Gait & Station: normal Assets  Assets: Resilience    Physical Exam: Physical Exam Vitals and nursing note reviewed.    ROS Blood pressure 110/67, pulse 60, temperature 97.8 F (36.6 C), resp. rate 17, height 5' 4 (1.626 m), weight 44.9 kg, SpO2 100%. Body mass index is 16.99 kg/m.  Diagnosis: Active Problems:   Schizophrenia, paranoid (HCC) Paranoid  PLAN: Safety and Monitoring:  -- Involuntary admission to inpatient psychiatric unit for safety, stabilization and treatment  -- Daily contact with patient to assess and evaluate symptoms and progress in treatment  -- Patient's case to be discussed in multi-disciplinary team meeting  -- Observation Level : q15 minute checks  -- Vital signs:  q12 hours  -- Precautions: suicide, elopement, and assault -- Encouraged patient to participate in unit  milieu and in scheduled group therapies  2. Psychiatric Diagnoses and Treatment: Patient  2 physicians approved non emergent forced medications legal guardian gave consent for Haldol  oral or IM  Zyprexa  15 mg BID  Haldol  10 mg bid added -  LAI haldol  Dec 100mg  Q4 weeks-07/26/2024 DSS gave consent-nonemergent forced medication consented by legal guardian  . Discharge Planning:   -- Social work and case management to assist with discharge planning and identification of hospital follow-up needs prior to discharge  -- Estimated LOS: 3-4 days  Allyn Foil, MD 08/09/2024, 12:25 PM

## 2024-08-09 NOTE — Group Note (Signed)
 Date:  08/09/2024 Time:  8:45 PM  Group Topic/Focus:  Self Care:   The focus of this group is to help patients understand the importance of self-care in order to improve or restore emotional, physical, spiritual, interpersonal, and financial health.    Participation Level:  none  Participation Quality:  did not attend  Affect:  Not Congruent  Cognitive:  Lacking  Insight: None  Engagement in Group:  Poor  Modes of Intervention:  none  Additional Comments:    Gwendlyn LITTIE Sharps 08/09/2024, 8:45 PM

## 2024-08-10 DIAGNOSIS — F200 Paranoid schizophrenia: Secondary | ICD-10-CM | POA: Diagnosis not present

## 2024-08-10 NOTE — Group Note (Unsigned)
 Date:  08/10/2024 Time:  12:17 PM  Group Topic/Focus:    group   Participation Level:  {BHH PARTICIPATION OZCZO:77735}  Participation Quality:  {B[facility] PARTICIPATION QUALITY:22265}  Affect:  {B[facility] AFFECT:22266}  Cognitive:  {B[facility] COGNITIVE:22267}  Insight: {BHH Insight2:20797}  Engagement in Group:  {B[facility] ENGAGEMENT IN HMNLE:77731}  Modes of Intervention:  {B[facility] MODES OF INTERVENTION:22269}  Additional Comments:  ***  Harlene LITTIE Gavel 08/10/2024, 12:17 PM

## 2024-08-10 NOTE — Progress Notes (Signed)
 " [facility] MD Progress Note   12:07 PM [name]  MRN:  [id number]  [name] is a 64 y.o. female admitted: Medicallyfor 05/02/2024 10:56 AM for dehydration, failure to thrive and hypoglycemia. She carries the psychiatric diagnoses of Schizophrenia and has medical hx of hypoglycemia and failure to thrive and has a past medical history of sinus bradycardia, chronic anemia, severe malnutrition and recurrent hypoglycemia.. .    Subjective:  Chart reviewed, case discussed in multidisciplinary meeting, patient seen during rounds.    Patient is to be resting in her room.  She offers no complaints.  Per nursing patient has fair appetite and sleep.  She remains superficial and minimally engaged with the provider and gets very upset related to questions of mental health like SI/HI/plan and hallucinations.  Past Psychiatric History: see h&P Family History: History reviewed. No pertinent family history. Social History:  Social History   Substance and Sexual Activity  Alcohol Use Not Currently   Comment: Refuses to disclose how much     Social History   Substance and Sexual Activity  Drug Use No   Comment: Refuses to answer    Social History   Socioeconomic History   Marital status: Single    Spouse name: Not on file   Number of children: Not on file   Years of education: Not on file   Highest education level: Not on file  Occupational History   Not on file  Tobacco Use   Smoking status: Every Day    Current packs/day: 2.00    Types: Cigarettes    Passive exposure: Current   Smokeless tobacco: Never  Vaping Use   Vaping status: Never Used  Substance and Sexual Activity   Alcohol use: Not Currently    Comment: Refuses to disclose how much   Drug use: No    Comment: Refuses to answer   Sexual activity: Not Currently    Comment: refused to answer  Other Topics Concern   Not on file  Social History Narrative   Not on file   Social Drivers of Health   Tobacco Use: High Risk  (05/06/2024)   Patient History    Smoking Tobacco Use: Every Day    Smokeless Tobacco Use: Never    Passive Exposure: Current  Financial Resource Strain: Not on file  Food Insecurity: Patient Declined (05/06/2024)   Epic    Worried About Programme Researcher, Broadcasting/film/video in the Last Year: Patient declined    Barista in the Last Year: Patient declined  Transportation Needs: Patient Declined (05/06/2024)   Epic    Lack of Transportation (Medical): Patient declined    Lack of Transportation (Non-Medical): Patient declined  Physical Activity: Not on file  Stress: Not on file  Social Connections: Not on file  Depression (PHQ2-9): Not on file  Alcohol Screen: Low Risk (05/06/2024)   Alcohol Screen    Last Alcohol Screening Score (AUDIT): 0  Housing: Unknown (05/06/2024)   Epic    Unable to Pay for Housing in the Last Year: Patient declined    Number of Times Moved in the Last Year: Not on file    Homeless in the Last Year: Not on file  Utilities: Patient Declined (05/06/2024)   Epic    Threatened with loss of utilities: Patient declined  Health Literacy: Not on file   Past Medical History:  Past Medical History:  Diagnosis Date   Anemia 10/02/2021   Non compliance w medication regimen    Schizophrenia (  [facility])     Past Surgical History:  Procedure Laterality Date   BIOPSY  09/25/2021   Procedure: BIOPSY;  Surgeon: Teressa Toribio SQUIBB, MD;  Location: [facility];  Service: Gastroenterology;;   ESOPHAGOGASTRODUODENOSCOPY (EGD) WITH PROPOFOL  [name] 09/25/2021   Procedure: ESOPHAGOGASTRODUODENOSCOPY (EGD) WITH PROPOFOL ;  Surgeon: Teressa Toribio SQUIBB, MD;  Location: [facility];  Service: Gastroenterology;  Laterality: [name];  With NGT placement    Current Medications: Current Facility-Administered Medications  Medication Dose Route Frequency Provider Last Rate Last Admin   acetaminophen  (TYLENOL ) tablet 650 mg  650 mg Oral Q6H PRN Starkes-Perry, Takia S, FNP       alum & mag hydroxide-simeth  (MAALOX/MYLANTA) 200-200-20 MG/5ML suspension 30 mL  30 mL Oral Q4H PRN Starkes-Perry, Takia S, FNP       haloperidol  (HALDOL ) tablet 5 mg  5 mg Oral BID Tyrone Pautsch, MD   5 mg at 08/10/24 9078   Or   haloperidol  lactate (HALDOL ) injection 10 mg  10 mg Intramuscular BID Glenys Snader, MD       HYDROcodone -acetaminophen  (NORCO/VICODIN) 5-325 MG per tablet 1-2 tablet  1-2 tablet Oral Q4H PRN Starkes-Perry, Takia S, FNP       magnesium  hydroxide (MILK OF MAGNESIA) suspension 30 mL  30 mL Oral Daily PRN Starkes-Perry, Takia S, FNP       multivitamin with minerals tablet 1 tablet  1 tablet Oral Daily Wilkie Majel RAMAN, FNP   1 tablet at 08/10/24 9078   OLANZapine  (ZYPREXA ) injection 5 mg  5 mg Intramuscular TID PRN Wilkie Majel RAMAN, FNP       OLANZapine  (ZYPREXA ) tablet 15 mg  15 mg Oral BID Facundo Allemand, MD   15 mg at 08/10/24 9077   OLANZapine  zydis (ZYPREXA ) disintegrating tablet 5 mg  5 mg Oral TID PRN Starkes-Perry, Takia S, FNP       ondansetron  (ZOFRAN ) tablet 4 mg  4 mg Oral Q6H PRN Starkes-Perry, Majel RAMAN, FNP       Or   ondansetron  (ZOFRAN ) injection 4 mg  4 mg Intravenous Q6H PRN Starkes-Perry, Majel RAMAN, FNP       thiamine  (VITAMIN B1) tablet 100 mg  100 mg Oral Daily Wilkie Majel RAMAN, FNP   100 mg at 08/10/24 9076   traZODone  (DESYREL ) tablet 50 mg  50 mg Oral QHS Starkes-Perry, Takia S, FNP   50 mg at 08/09/24 2138    Lab Results:  No results found for this or any previous visit (from the past 48 hours).   Blood Alcohol level:  Lab Results  Component Value Date   [name] <15 05/02/2024   ETH <15 05/01/2024    Metabolic Disorder Labs: Lab Results  Component Value Date   HGBA1C 5.6 05/14/2024   MPG 114.02 05/14/2024   MPG 116.89 05/02/2024   Lab Results  Component Value Date   PROLACTIN 13.6 04/16/2015   Lab Results  Component Value Date   CHOL 219 (H) 05/14/2024   TRIG 84 05/14/2024   HDL 83 05/14/2024   CHOLHDL 2.6 05/14/2024   VLDL 17  05/14/2024   LDLCALC 119 (H) 05/14/2024   LDLCALC 130 (H) 05/01/2024    Physical Findings: AIMS:  , ,  ,  ,    CIWA:    COWS:      Psychiatric Specialty Exam:  Presentation  General Appearance:  Casual  Eye Contact: Fair  Speech: Normal rate  Speech Volume: Increased    Mood and Affect  Mood: Angry  Affect: Congruent  Thought Process  Thought Processes: Disorganized  Descriptions of Associations: Concrete Orientation:Partial  Thought Content: Discharge focused Hallucinations:RESPONDING TO INTERNAL STIMULI Ideas of Reference:Paranoia; Delusions; Percusatory  Suicidal Thoughts:not endorsing Homicidal Thoughts:nor endorsing  Sensorium  Memory: Recent Poor  Judgment: Poor  Insight: Poor   Executive Functions  Concentration: Poor  Attention Span: Poor  Recall: Poor  Fund of Knowledge: Poor  Language: Poor   Psychomotor Activity  Psychomotor Activity:No data recorded Musculoskeletal: Strength & Muscle Tone: within normal limits Gait & Station: normal Assets  Assets: Resilience    Physical Exam: Physical Exam Vitals and nursing note reviewed.    ROS Blood pressure 104/65, pulse 61, temperature (!) 97.2 F (36.2 C), resp. rate 16, height 5' 4 (1.626 m), weight 44.9 kg, SpO2 100%. Body mass index is 16.99 kg/m.  Diagnosis: Active Problems:   Schizophrenia, paranoid (HCC) Paranoid  PLAN: Safety and Monitoring:  -- Involuntary admission to inpatient psychiatric unit for safety, stabilization and treatment  -- Daily contact with patient to assess and evaluate symptoms and progress in treatment  -- Patient's case to be discussed in multi-disciplinary team meeting  -- Observation Level : q15 minute checks  -- Vital signs:  q12 hours  -- Precautions: suicide, elopement, and assault -- Encouraged patient to participate in unit milieu and in scheduled group therapies  2. Psychiatric Diagnoses and Treatment: Patient  2  physicians approved non emergent forced medications legal guardian gave consent for Haldol  oral or IM  Zyprexa  15 mg BID  Haldol  10 mg bid added -  LAI haldol  Dec 100mg  Q4 weeks-07/26/2024 DSS gave consent-nonemergent forced medication consented by legal guardian  . Discharge Planning:   -- Social work and case management to assist with discharge planning and identification of hospital follow-up needs prior to discharge  -- Estimated LOS: 3-4 days  Allyn Foil, MD 08/10/2024, 12:07 PM "

## 2024-08-10 NOTE — Plan of Care (Signed)
   Problem: Education: Goal: Emotional status will improve Outcome: Progressing Goal: Mental status will improve Outcome: Progressing Goal: Verbalization of understanding the information provided will improve Outcome: Progressing   Problem: Activity: Goal: Interest or engagement in activities will improve Outcome: Progressing

## 2024-08-10 NOTE — B[facility] Counselor (Signed)
 CSW spoke with Oddis Peng, with the homes that is interested in patient.  She reports that she will visit the patient on Monday 08/14/2024 between 10am and 12pm.  CSW has informed primary CSW worker.  Sherryle Margo, MSW, LCSW 08/10/2024 3:32 PM

## 2024-08-10 NOTE — Group Note (Signed)
 Recreation Therapy Group Note   Group Topic:Coping Skills  Group Date: 08/10/2024 Start Time: 1400 End Time: 1430 Facilitators: Celestia Jeoffrey BRAVO, LRT , CTRS Location: Dayroom  Group Description: Mind Map.  Patient was provided a blank template of a diagram with 32 blank boxes in a tiered system, branching from the center (similar to a bubble chart). LRT directed patients to label the middle of the diagram Coping Skills. LRT and patients then came up with 8 different coping skills as examples. Pt were directed to record their coping skills in the 2nd tier boxes closest to the center.  Patients would then share their coping skills with the group as LRT wrote them out. LRT gave a handout of 99 different coping skills at the end of group.   Goal Area(s) Addressed: Patients will be able to define coping skills. Patient will identify new coping skills.  Patient will increase communication.   Affect/Mood: [name]   Participation Level: Did not attend    Clinical Observations/Individualized Feedback: Patient did not attend.  Plan: Continue to engage patient in RT group sessions 2-3x/week.   Jeoffrey BRAVO Celestia, LRT, CTRS 08/10/2024 4:16 PM

## 2024-08-10 NOTE — Group Note (Signed)
 Date:  08/10/2024 Time:  9:31 PM  Group Topic/Focus:  Goals Group:   The focus of this group is to help patients establish daily goals to achieve during treatment and discuss how the patient can incorporate goal setting into their daily lives to aide in recovery.    Participation Level:  Did Not Attend  Participation Quality:     Affect:     Cognitive:     Insight: None  Engagement in Group:     Modes of Intervention:     Additional Comments:    Sherrilyn JAYSON Redman 08/10/2024, 9:31 PM

## 2024-08-10 NOTE — Group Note (Deleted)
 Date:  08/10/2024 Time:  10:43 AM  Group Topic/Focus:   Effective exercises for geriatric patients focus on improving balance, strength, flexibility, and aerobic fitness, crucial for preventing falls and maintaining independence; key activities include walking, chair yoga/stretching, strength training with bands or light weights (like calf raises, wall push-ups, sit-to-stands), and balance drills (like single-leg stands and heel-to-toe walking), always with a doctor's approval.    Participation Level:  Did Not Attend   Harlene LITTIE Gavel 08/10/2024, 10:43 AM

## 2024-08-10 NOTE — Progress Notes (Signed)
" °   08/10/24 1400  Psych Admission Type (Psych Patients Only)  Admission Status Involuntary  Psychosocial Assessment  Patient Complaints None  Eye Contact Brief  Facial Expression Flat  Affect Flat  Speech Soft  Interaction Minimal  Motor Activity Slow  Appearance/Hygiene In scrubs  Behavior Characteristics Guarded  Mood Pleasant  Thought Process  Coherency WDL  Content WDL  Delusions None reported or observed  Perception WDL  Hallucination None reported or observed  Judgment Impaired  Confusion Mild  Danger to Self  Current suicidal ideation? Denies  Danger to Others  Danger to Others None reported or observed    "

## 2024-08-10 NOTE — Plan of Care (Signed)
  Problem: Education: Goal: Knowledge of [place] [facility] Education information/materials will improve Outcome: Progressing Goal: Emotional status will improve Outcome: Progressing Goal: Mental status will improve Outcome: Progressing Goal: Verbalization of understanding the information provided will improve Outcome: Progressing   Problem: Activity: Goal: Interest or engagement in activities will improve Outcome: Progressing Goal: Sleeping patterns will improve Outcome: Progressing   Problem: Coping: Goal: Ability to verbalize frustrations and anger appropriately will improve Outcome: Progressing Goal: Ability to demonstrate self-control will improve Outcome: Progressing   Problem: Health Behavior/Discharge Planning: Goal: Identification of resources available to assist in meeting health care needs will improve Outcome: Progressing Goal: Compliance with treatment plan for underlying cause of condition will improve Outcome: Progressing   Problem: Physical Regulation: Goal: Ability to maintain clinical measurements within normal limits will improve Outcome: Progressing   Problem: Safety: Goal: Periods of time without injury will increase Outcome: Progressing   Problem: Education: Goal: Knowledge of General Education information will improve Description: Including pain rating scale, medication(s)/side effects and non-pharmacologic comfort measures Outcome: Progressing   Problem: Health Behavior/Discharge Planning: Goal: Ability to manage health-related needs will improve Outcome: Progressing   Problem: Clinical Measurements: Goal: Ability to maintain clinical measurements within normal limits will improve Outcome: Progressing Goal: Will remain free from infection Outcome: Progressing Goal: Diagnostic test results will improve Outcome: Progressing Goal: Respiratory complications will improve Outcome: Progressing Goal: Cardiovascular complication will be  avoided Outcome: Progressing   Problem: Activity: Goal: Risk for activity intolerance will decrease Outcome: Progressing   Problem: Nutrition: Goal: Adequate nutrition will be maintained Outcome: Progressing   Problem: Coping: Goal: Level of anxiety will decrease Outcome: Progressing   Problem: Elimination: Goal: Will not experience complications related to bowel motility Outcome: Progressing Goal: Will not experience complications related to urinary retention Outcome: Progressing   Problem: Pain Managment: Goal: General experience of comfort will improve and/or be controlled Outcome: Progressing   Problem: Safety: Goal: Ability to remain free from injury will improve Outcome: Progressing   Problem: Skin Integrity: Goal: Risk for impaired skin integrity will decrease Outcome: Progressing   Problem: Health Behavior/Discharge Planning: Goal: Compliance with prescribed medication regimen will improve Outcome: Progressing   Problem: Nutritional: Goal: Ability to achieve adequate nutritional intake will improve Outcome: Progressing

## 2024-08-10 NOTE — Group Note (Signed)
 [facility] LCSW Group Therapy Note   Group Date: 08/10/2024 Start Time: 1315 End Time: 1345   Type of Therapy/Topic:  Group Therapy:  Emotion Regulation  Participation Level:  Did Not Attend   Mood:  Description of Group:    The purpose of this group is to assist patients in learning to regulate negative emotions and experience positive emotions. Patients will be guided to discuss ways in which they have been vulnerable to their negative emotions. These vulnerabilities will be juxtaposed with experiences of positive emotions or situations, and patients challenged to use positive emotions to combat negative ones. Special emphasis will be placed on coping with negative emotions in conflict situations, and patients will process healthy conflict resolution skills.  Therapeutic Goals: Patient will identify two positive emotions or experiences to reflect on in order to balance out negative emotions:  Patient will label two or more emotions that they find the most difficult to experience:  Patient will be able to demonstrate positive conflict resolution skills through discussion or role plays:   Summary of Patient Progress:   X    Therapeutic Modalities:   Cognitive Behavioral Therapy Feelings Identification Dialectical Behavioral Therapy   [name], LCSWA

## 2024-08-10 NOTE — Progress Notes (Signed)
" °   08/10/24 2200  Psych Admission Type (Psych Patients Only)  Admission Status Involuntary  Psychosocial Assessment  Patient Complaints None  Eye Contact Brief  Facial Expression Flat  Affect Flat  Speech Soft  Interaction Minimal  Motor Activity Slow  Appearance/Hygiene In scrubs  Behavior Characteristics Guarded  Mood Pleasant  Thought Process  Coherency WDL  Content WDL  Delusions None reported or observed  Perception WDL  Hallucination None reported or observed  Judgment Impaired  Confusion Mild  Danger to Self  Current suicidal ideation? Denies  Danger to Others  Danger to Others None reported or observed   Mood/Behavior:  Pleasant and cooperative.    Psych assessment: Denies SI/HI and AVH.     Interaction / Group attendance:  Isolative to room. Minimal interaction staff. No interaction with peers noted. Did not attend group.   Medication/ PRNs: Compliant with scheduled medications. No prns required.   Pain: Denies  15 min checks in place for safety. "

## 2024-08-11 DIAGNOSIS — F200 Paranoid schizophrenia: Secondary | ICD-10-CM | POA: Diagnosis not present

## 2024-08-11 NOTE — Progress Notes (Signed)
 " [facility] MD Progress Note   9:14 PM [name]  MRN:  [id number]  [name] is a 64 y.o. female admitted: Medicallyfor 05/02/2024 10:56 AM for dehydration, failure to thrive and hypoglycemia. She carries the psychiatric diagnoses of Schizophrenia and has medical hx of hypoglycemia and failure to thrive and has a past medical history of sinus bradycardia, chronic anemia, severe malnutrition and recurrent hypoglycemia.. .    Subjective:  Chart reviewed, case discussed in multidisciplinary meeting, patient seen during rounds.    Patient is noted to be pacing in her room and talking to herself.  Per nursing patient displays no behavioral problems on the unit, remains minimally engaged with other peers but engages in group therapy, compliant with her medications, calm and cooperative.  She continues to decline to refuse questions related to SI/HI/hallucinations stating no need to keep her in but she needs to go home . Past Psychiatric History: see h&P Family History: History reviewed. No pertinent family history. Social History:  Social History   Substance and Sexual Activity  Alcohol Use Not Currently   Comment: Refuses to disclose how much     Social History   Substance and Sexual Activity  Drug Use No   Comment: Refuses to answer    Social History   Socioeconomic History   Marital status: Single    Spouse name: Not on file   Number of children: Not on file   Years of education: Not on file   Highest education level: Not on file  Occupational History   Not on file  Tobacco Use   Smoking status: Every Day    Current packs/day: 2.00    Types: Cigarettes    Passive exposure: Current   Smokeless tobacco: Never  Vaping Use   Vaping status: Never Used  Substance and Sexual Activity   Alcohol use: Not Currently    Comment: Refuses to disclose how much   Drug use: No    Comment: Refuses to answer   Sexual activity: Not Currently    Comment: refused to answer  Other Topics  Concern   Not on file  Social History Narrative   Not on file   Social Drivers of Health   Tobacco Use: High Risk (05/06/2024)   Patient History    Smoking Tobacco Use: Every Day    Smokeless Tobacco Use: Never    Passive Exposure: Current  Financial Resource Strain: Not on file  Food Insecurity: Patient Declined (05/06/2024)   Epic    Worried About Programme Researcher, Broadcasting/film/video in the Last Year: Patient declined    Barista in the Last Year: Patient declined  Transportation Needs: Patient Declined (05/06/2024)   Epic    Lack of Transportation (Medical): Patient declined    Lack of Transportation (Non-Medical): Patient declined  Physical Activity: Not on file  Stress: Not on file  Social Connections: Not on file  Depression (PHQ2-9): Not on file  Alcohol Screen: Low Risk (05/06/2024)   Alcohol Screen    Last Alcohol Screening Score (AUDIT): 0  Housing: Unknown (05/06/2024)   Epic    Unable to Pay for Housing in the Last Year: Patient declined    Number of Times Moved in the Last Year: Not on file    Homeless in the Last Year: Not on file  Utilities: Patient Declined (05/06/2024)   Epic    Threatened with loss of utilities: Patient declined  Health Literacy: Not on file   Past Medical History:  Past  Medical History:  Diagnosis Date   Anemia 10/02/2021   Non compliance w medication regimen    Schizophrenia [facility])     Past Surgical History:  Procedure Laterality Date   BIOPSY  09/25/2021   Procedure: BIOPSY;  Surgeon: Teressa Toribio SQUIBB, MD;  Location: [facility];  Service: Gastroenterology;;   ESOPHAGOGASTRODUODENOSCOPY (EGD) WITH PROPOFOL  [name] 09/25/2021   Procedure: ESOPHAGOGASTRODUODENOSCOPY (EGD) WITH PROPOFOL ;  Surgeon: Teressa Toribio SQUIBB, MD;  Location: [facility];  Service: Gastroenterology;  Laterality: [name];  With NGT placement    Current Medications: Current Facility-Administered Medications  Medication Dose Route Frequency Provider Last Rate Last Admin    acetaminophen  (TYLENOL ) tablet 650 mg  650 mg Oral Q6H PRN Starkes-Perry, Takia S, FNP       alum & mag hydroxide-simeth (MAALOX/MYLANTA) 200-200-20 MG/5ML suspension 30 mL  30 mL Oral Q4H PRN Starkes-Perry, Takia S, FNP       haloperidol  (HALDOL ) tablet 5 mg  5 mg Oral BID Makinze Jani, MD   5 mg at 08/11/24 9050   Or   haloperidol  lactate (HALDOL ) injection 10 mg  10 mg Intramuscular BID Dori Devino, MD       HYDROcodone -acetaminophen  (NORCO/VICODIN) 5-325 MG per tablet 1-2 tablet  1-2 tablet Oral Q4H PRN Starkes-Perry, Takia S, FNP       magnesium  hydroxide (MILK OF MAGNESIA) suspension 30 mL  30 mL Oral Daily PRN Starkes-Perry, Takia S, FNP       multivitamin with minerals tablet 1 tablet  1 tablet Oral Daily Wilkie Majel RAMAN, FNP   1 tablet at 08/11/24 9050   OLANZapine  (ZYPREXA ) injection 5 mg  5 mg Intramuscular TID PRN Wilkie Majel RAMAN, FNP       OLANZapine  (ZYPREXA ) tablet 15 mg  15 mg Oral BID Blaklee Shores, MD   15 mg at 08/11/24 0957   OLANZapine  zydis (ZYPREXA ) disintegrating tablet 5 mg  5 mg Oral TID PRN Starkes-Perry, Takia S, FNP       ondansetron  (ZOFRAN ) tablet 4 mg  4 mg Oral Q6H PRN Starkes-Perry, Majel RAMAN, FNP       Or   ondansetron  (ZOFRAN ) injection 4 mg  4 mg Intravenous Q6H PRN Starkes-Perry, Majel RAMAN, FNP       thiamine  (VITAMIN B1) tablet 100 mg  100 mg Oral Daily Wilkie Majel RAMAN, FNP   100 mg at 08/11/24 9050   traZODone  (DESYREL ) tablet 50 mg  50 mg Oral QHS Starkes-Perry, Takia S, FNP   50 mg at 08/10/24 2137    Lab Results:  No results found for this or any previous visit (from the past 48 hours).   Blood Alcohol level:  Lab Results  Component Value Date   [facility] <15 05/02/2024   ETH <15 05/01/2024    Metabolic Disorder Labs: Lab Results  Component Value Date   HGBA1C 5.6 05/14/2024   MPG 114.02 05/14/2024   MPG 116.89 05/02/2024   Lab Results  Component Value Date   PROLACTIN 13.6 04/16/2015   Lab Results  Component  Value Date   CHOL 219 (H) 05/14/2024   TRIG 84 05/14/2024   HDL 83 05/14/2024   CHOLHDL 2.6 05/14/2024   VLDL 17 05/14/2024   LDLCALC 119 (H) 05/14/2024   LDLCALC 130 (H) 05/01/2024    Physical Findings: AIMS:  , ,  ,  ,    CIWA:    COWS:      Psychiatric Specialty Exam:  Presentation  General Appearance:  Casual  Eye Contact: Fair  Speech: Normal rate  Speech Volume: Increased    Mood and Affect  Mood: Angry  Affect: Congruent   Thought Process  Thought Processes: Disorganized  Descriptions of Associations: Concrete Orientation:Partial  Thought Content: Discharge focused Hallucinations:RESPONDING TO INTERNAL STIMULI Ideas of Reference:Paranoia; Delusions; Percusatory  Suicidal Thoughts:not endorsing Homicidal Thoughts:nor endorsing  Sensorium  Memory: Recent Poor  Judgment: Poor  Insight: Poor   Executive Functions  Concentration: Poor  Attention Span: Poor  Recall: Poor  Fund of Knowledge: Poor  Language: Poor   Psychomotor Activity  Psychomotor Activity:No data recorded Musculoskeletal: Strength & Muscle Tone: within normal limits Gait & Station: normal Assets  Assets: Resilience    Physical Exam: Physical Exam Vitals and nursing note reviewed.    ROS Blood pressure 108/64, pulse 65, temperature 97.7 F (36.5 C), resp. rate 18, height 5' 4 (1.626 m), weight 44.9 kg, SpO2 98%. Body mass index is 16.99 kg/m.  Diagnosis: Active Problems:   Schizophrenia, paranoid (HCC) Paranoid  PLAN: Safety and Monitoring:  -- Involuntary admission to inpatient psychiatric unit for safety, stabilization and treatment  -- Daily contact with patient to assess and evaluate symptoms and progress in treatment  -- Patient's case to be discussed in multi-disciplinary team meeting  -- Observation Level : q15 minute checks  -- Vital signs:  q12 hours  -- Precautions: suicide, elopement, and assault -- Encouraged patient to  participate in unit milieu and in scheduled group therapies  2. Psychiatric Diagnoses and Treatment: Patient  2 physicians approved non emergent forced medications legal guardian gave consent for Haldol  oral or IM  Zyprexa  15 mg BID  Haldol  10 mg bid added -  LAI haldol  Dec 100mg  Q4 weeks-07/26/2024 DSS gave consent-nonemergent forced medication consented by legal guardian  . Discharge Planning:   -- Social work and case management to assist with discharge planning and identification of hospital follow-up needs prior to discharge  -- Estimated LOS: 3-4 days  Allyn Foil, MD 08/11/2024, 9:14 PM "

## 2024-08-11 NOTE — Plan of Care (Signed)
  Problem: Education: Goal: Knowledge of [place] [facility] Education information/materials will improve Outcome: Progressing Goal: Emotional status will improve Outcome: Progressing Goal: Mental status will improve Outcome: Progressing Goal: Verbalization of understanding the information provided will improve Outcome: Progressing   Problem: Activity: Goal: Interest or engagement in activities will improve Outcome: Progressing Goal: Sleeping patterns will improve Outcome: Progressing   Problem: Coping: Goal: Ability to verbalize frustrations and anger appropriately will improve Outcome: Progressing Goal: Ability to demonstrate self-control will improve Outcome: Progressing   Problem: Health Behavior/Discharge Planning: Goal: Identification of resources available to assist in meeting health care needs will improve Outcome: Progressing Goal: Compliance with treatment plan for underlying cause of condition will improve Outcome: Progressing   Problem: Physical Regulation: Goal: Ability to maintain clinical measurements within normal limits will improve Outcome: Progressing   Problem: Safety: Goal: Periods of time without injury will increase Outcome: Progressing   Problem: Education: Goal: Knowledge of General Education information will improve Description: Including pain rating scale, medication(s)/side effects and non-pharmacologic comfort measures Outcome: Progressing   Problem: Health Behavior/Discharge Planning: Goal: Ability to manage health-related needs will improve Outcome: Progressing   Problem: Clinical Measurements: Goal: Ability to maintain clinical measurements within normal limits will improve Outcome: Progressing Goal: Will remain free from infection Outcome: Progressing Goal: Diagnostic test results will improve Outcome: Progressing Goal: Respiratory complications will improve Outcome: Progressing Goal: Cardiovascular complication will be  avoided Outcome: Progressing   Problem: Activity: Goal: Risk for activity intolerance will decrease Outcome: Progressing   Problem: Nutrition: Goal: Adequate nutrition will be maintained Outcome: Progressing   Problem: Coping: Goal: Level of anxiety will decrease Outcome: Progressing   Problem: Elimination: Goal: Will not experience complications related to bowel motility Outcome: Progressing Goal: Will not experience complications related to urinary retention Outcome: Progressing   Problem: Pain Managment: Goal: General experience of comfort will improve and/or be controlled Outcome: Progressing   Problem: Safety: Goal: Ability to remain free from injury will improve Outcome: Progressing   Problem: Skin Integrity: Goal: Risk for impaired skin integrity will decrease Outcome: Progressing   Problem: Health Behavior/Discharge Planning: Goal: Compliance with prescribed medication regimen will improve Outcome: Progressing   Problem: Nutritional: Goal: Ability to achieve adequate nutritional intake will improve Outcome: Progressing

## 2024-08-11 NOTE — Group Note (Signed)
 Recreation Therapy Group Note   Group Topic:Healthy Support Systems  Group Date: 08/11/2024 Start Time: 1400 End Time: 1445 Facilitators: Celestia Jeoffrey BRAVO, LRT, CTRS Location: Dayroom  Group Description: Straw Bridge. In groups or individually, patients were given 10 plastic drinking straws and an equal length of masking tape. Using the materials provided, patients were instructed to build a free-standing bridge-like structure to suspend an everyday item (ex: deck of cards) off the floor or table surface. All materials were required to be used in secondary school teacher. LRT facilitated post-activity discussion reviewing the importance of having strong and healthy support systems in our lives. LRT discussed how the people in our lives serve as the tape and the deck of cards we placed on top of our straw structure are the stressors we face in daily life. LRT and pts discussed what happens in our life when things get too heavy for us , and we don't have strong supports outside of the hospital. Pt shared 2 of their healthy supports in their life aloud in the group.   Goal Area(s) Addressed:  Patient will identify 2 healthy supports in their life. Patient will identify skills to successfully complete activity. Patient will identify correlation of this activity to life post-discharge.  Patient will build on frustration tolerance skills. Patient will increase team building and communication skills.    Affect/Mood: [name]   Participation Level: Did not attend    Clinical Observations/Individualized Feedback: Patient did not attend.  Plan: Continue to engage patient in RT group sessions 2-3x/week.   Jeoffrey BRAVO Celestia, LRT, CTRS 08/11/2024 4:25 PM

## 2024-08-11 NOTE — Plan of Care (Signed)
" °  Problem: Coping: Goal: Ability to demonstrate self-control will improve Outcome: Progressing   Problem: Nutrition: Goal: Adequate nutrition will be maintained Outcome: Progressing   Problem: Activity: Goal: Interest or engagement in activities will improve Outcome: Not Progressing   "

## 2024-08-11 NOTE — Group Note (Signed)
 Date:  08/11/2024 Time:  11:03 AM  Group Topic/Focus:  Coping With Mental Health Crisis:   The purpose of this group is to help patients identify strategies for coping with mental health crisis.  Group discusses possible causes of crisis and ways to manage them effectively.    Participation Level:  Did Not Attend   [name] 08/11/2024, 11:03 AM

## 2024-08-11 NOTE — Progress Notes (Signed)
 Behavior:  Cooperative.  Isolates to room.    Psych assessment: Denies SI/HI.  Group attendance:  0/2  Medication/ PRNs: Compliant.  Pain: Denies.  15 min checks in place for safety.

## 2024-08-11 NOTE — Progress Notes (Signed)
" °   08/11/24 2001  Psych Admission Type (Psych Patients Only)  Admission Status Involuntary  Psychosocial Assessment  Patient Complaints None  Eye Contact Brief  Facial Expression Flat  Affect Flat  Speech Soft  Interaction Minimal  Motor Activity Slow  Appearance/Hygiene In scrubs  Behavior Characteristics Cooperative;Appropriate to situation  Mood Pleasant  Thought Process  Coherency WDL  Content WDL  Delusions None reported or observed  Perception WDL  Hallucination None reported or observed  Judgment Impaired  Confusion Mild  Danger to Self  Current suicidal ideation? Denies  Danger to Others  Danger to Others None reported or observed   Mood/Behavior:  Pleasant and cooperative.    Psych assessment: Denies SI/HI and AVH.     Interaction / Group attendance:  Isolative to room. Minimal interaction staff. No interaction with peers noted. Did not attend group.   Medication/ PRNs: Compliant with scheduled medications. No prns required.   Pain: Denies   15 min checks in place for safety. "

## 2024-08-12 DIAGNOSIS — F200 Paranoid schizophrenia: Secondary | ICD-10-CM | POA: Diagnosis not present

## 2024-08-12 NOTE — Group Note (Signed)
 Date:  08/12/2024 Time:  8:17 PM  Group Topic/Focus:  Coping With Mental Health Crisis:   The purpose of this group is to help patients identify strategies for coping with mental health crisis.  Group discusses possible causes of crisis and ways to manage them effectively. Healthy Communication:   The focus of this group is to discuss communication, barriers to communication, as well as healthy ways to communicate with others. Overcoming Stress:   The focus of this group is to define stress and help patients assess their triggers. Self Care:   The focus of this group is to help patients understand the importance of self-care in order to improve or restore emotional, physical, spiritual, interpersonal, and financial health.    Participation Level:  Did Not Attend  Participation Quality:  Pt did not attend wrap up group.  Affect:  Appropriate  Cognitive:  Appropriate  Insight: Appropriate  Engagement in Group:  None  Modes of Intervention:  Discussion and Support  Additional Comments:  Pt did not attend wrap up group tonight. Will encourage pt to attend group sessions with peers and participate.   Brad GORMAN Ryder 08/12/2024, 8:17 PM

## 2024-08-12 NOTE — Progress Notes (Signed)
 Patient pleasant but short during interaction. Accepted medications without issue. Not willing to engage with SI/HI/AVH questions. Unable to assess. Ongoing monitoring continues.   08/12/24 1000  Psych Admission Type (Psych Patients Only)  Admission Status Involuntary  Psychosocial Assessment  Patient Complaints None  Eye Contact Brief  Facial Expression Flat  Affect Flat  Speech Soft  Interaction Minimal  Motor Activity Slow  Appearance/Hygiene In scrubs  Behavior Characteristics Cooperative;Guarded  Mood Pleasant  Thought Process  Coherency WDL  Content WDL  Delusions None reported or observed  Perception WDL  Hallucination None reported or observed  Judgment Impaired  Confusion Mild  Danger to Self  Current suicidal ideation? Denies  Danger to Others  Danger to Others None reported or observed

## 2024-08-12 NOTE — Progress Notes (Signed)
 " [facility] MD Progress Note   12:56 PM Gerene Nedd  MRN:  [id number]  Margia Wiesen is a 64 y.o. female admitted: Medicallyfor 05/02/2024 10:56 AM for dehydration, failure to thrive and hypoglycemia. She carries the psychiatric diagnoses of Schizophrenia and has medical hx of hypoglycemia and failure to thrive and has a past medical history of sinus bradycardia, chronic anemia, severe malnutrition and recurrent hypoglycemia.. .    Subjective:  Chart reviewed, case discussed in multidisciplinary meeting, patient seen during rounds.    Patient is noted to be standing in her room.  Patient offers no complaints.  She gets upset and does not respond to questions regarding SI/HI/plan.  The hallucinations.  She is intermittently noted to be talking to herself. Past Psychiatric History: see h&P Family History: History reviewed. No pertinent family history. Social History:  Social History   Substance and Sexual Activity  Alcohol Use Not Currently   Comment: Refuses to disclose how much     Social History   Substance and Sexual Activity  Drug Use No   Comment: Refuses to answer    Social History   Socioeconomic History   Marital status: Single    Spouse name: Not on file   Number of children: Not on file   Years of education: Not on file   Highest education level: Not on file  Occupational History   Not on file  Tobacco Use   Smoking status: Every Day    Current packs/day: 2.00    Types: Cigarettes    Passive exposure: Current   Smokeless tobacco: Never  Vaping Use   Vaping status: Never Used  Substance and Sexual Activity   Alcohol use: Not Currently    Comment: Refuses to disclose how much   Drug use: No    Comment: Refuses to answer   Sexual activity: Not Currently    Comment: refused to answer  Other Topics Concern   Not on file  Social History Narrative   Not on file   Social Drivers of Health   Tobacco Use: High Risk (05/06/2024)   Patient History    Smoking Tobacco  Use: Every Day    Smokeless Tobacco Use: Never    Passive Exposure: Current  Financial Resource Strain: Not on file  Food Insecurity: Patient Declined (05/06/2024)   Epic    Worried About Programme Researcher, Broadcasting/film/video in the Last Year: Patient declined    Barista in the Last Year: Patient declined  Transportation Needs: Patient Declined (05/06/2024)   Epic    Lack of Transportation (Medical): Patient declined    Lack of Transportation (Non-Medical): Patient declined  Physical Activity: Not on file  Stress: Not on file  Social Connections: Not on file  Depression (PHQ2-9): Not on file  Alcohol Screen: Low Risk (05/06/2024)   Alcohol Screen    Last Alcohol Screening Score (AUDIT): 0  Housing: Unknown (05/06/2024)   Epic    Unable to Pay for Housing in the Last Year: Patient declined    Number of Times Moved in the Last Year: Not on file    Homeless in the Last Year: Not on file  Utilities: Patient Declined (05/06/2024)   Epic    Threatened with loss of utilities: Patient declined  Health Literacy: Not on file   Past Medical History:  Past Medical History:  Diagnosis Date   Anemia 10/02/2021   Non compliance w medication regimen    Schizophrenia (HCC)     Past Surgical History:  Procedure Laterality Date   BIOPSY  09/25/2021   Procedure: BIOPSY;  Surgeon: Teressa Toribio SQUIBB, MD;  Location: [facility];  Service: Gastroenterology;;   ESOPHAGOGASTRODUODENOSCOPY (EGD) WITH PROPOFOL  [name] 09/25/2021   Procedure: ESOPHAGOGASTRODUODENOSCOPY (EGD) WITH PROPOFOL ;  Surgeon: Teressa Toribio SQUIBB, MD;  Location: [facility];  Service: Gastroenterology;  Laterality: [name];  With NGT placement    Current Medications: Current Facility-Administered Medications  Medication Dose Route Frequency Provider Last Rate Last Admin   acetaminophen  (TYLENOL ) tablet 650 mg  650 mg Oral Q6H PRN Starkes-Perry, Takia S, FNP       alum & mag hydroxide-simeth (MAALOX/MYLANTA) 200-200-20 MG/5ML suspension 30 mL  30 mL  Oral Q4H PRN Starkes-Perry, Takia S, FNP       haloperidol  (HALDOL ) tablet 5 mg  5 mg Oral BID Hazyl Marseille, MD   5 mg at 08/12/24 9080   Or   haloperidol  lactate (HALDOL ) injection 10 mg  10 mg Intramuscular BID Ankur Snowdon, MD       HYDROcodone -acetaminophen  (NORCO/VICODIN) 5-325 MG per tablet 1-2 tablet  1-2 tablet Oral Q4H PRN Starkes-Perry, Takia S, FNP       magnesium  hydroxide (MILK OF MAGNESIA) suspension 30 mL  30 mL Oral Daily PRN Starkes-Perry, Takia S, FNP       multivitamin with minerals tablet 1 tablet  1 tablet Oral Daily Wilkie Majel RAMAN, FNP   1 tablet at 08/12/24 9080   OLANZapine  (ZYPREXA ) injection 5 mg  5 mg Intramuscular TID PRN Wilkie Majel RAMAN, FNP       OLANZapine  (ZYPREXA ) tablet 15 mg  15 mg Oral BID Tegan Burnside, MD   15 mg at 08/12/24 0919   OLANZapine  zydis (ZYPREXA ) disintegrating tablet 5 mg  5 mg Oral TID PRN Starkes-Perry, Takia S, FNP       ondansetron  (ZOFRAN ) tablet 4 mg  4 mg Oral Q6H PRN Wilkie Majel RAMAN, FNP       Or   ondansetron  (ZOFRAN ) injection 4 mg  4 mg Intravenous Q6H PRN Starkes-Perry, Majel RAMAN, FNP       thiamine  (VITAMIN B1) tablet 100 mg  100 mg Oral Daily Wilkie Majel RAMAN, FNP   100 mg at 08/12/24 9080   traZODone  (DESYREL ) tablet 50 mg  50 mg Oral QHS Starkes-Perry, Takia S, FNP   50 mg at 08/11/24 2124    Lab Results:  No results found for this or any previous visit (from the past 48 hours).   Blood Alcohol level:  Lab Results  Component Value Date   [facility] <15 05/02/2024   ETH <15 05/01/2024    Metabolic Disorder Labs: Lab Results  Component Value Date   HGBA1C 5.6 05/14/2024   MPG 114.02 05/14/2024   MPG 116.89 05/02/2024   Lab Results  Component Value Date   PROLACTIN 13.6 04/16/2015   Lab Results  Component Value Date   CHOL 219 (H) 05/14/2024   TRIG 84 05/14/2024   HDL 83 05/14/2024   CHOLHDL 2.6 05/14/2024   VLDL 17 05/14/2024   LDLCALC 119 (H) 05/14/2024   LDLCALC 130 (H)  05/01/2024    Physical Findings: AIMS:  , ,  ,  ,    CIWA:    COWS:      Psychiatric Specialty Exam:  Presentation  General Appearance:  Casual  Eye Contact: Fair  Speech: Normal rate  Speech Volume: Increased    Mood and Affect  Mood: Angry  Affect: Congruent   Thought Process  Thought Processes: Disorganized  Descriptions  of Associations: Concrete Orientation:Partial  Thought Content: Discharge focused Hallucinations:RESPONDING TO INTERNAL STIMULI Ideas of Reference:Paranoia; Delusions; Percusatory  Suicidal Thoughts:not endorsing Homicidal Thoughts:nor endorsing  Sensorium  Memory: Recent Poor  Judgment: Poor  Insight: Poor   Executive Functions  Concentration: Poor  Attention Span: Poor  Recall: Poor  Fund of Knowledge: Poor  Language: Poor   Psychomotor Activity  Psychomotor Activity:No data recorded Musculoskeletal: Strength & Muscle Tone: within normal limits Gait & Station: normal Assets  Assets: Resilience    Physical Exam: Physical Exam Vitals and nursing note reviewed.    ROS Blood pressure 117/68, pulse 61, temperature (!) 97.4 F (36.3 C), resp. rate 17, height 5' 4 (1.626 m), weight 44.9 kg, SpO2 100%. Body mass index is 16.99 kg/m.  Diagnosis: Active Problems:   Schizophrenia, paranoid (HCC) Paranoid  PLAN: Safety and Monitoring:  -- Involuntary admission to inpatient psychiatric unit for safety, stabilization and treatment  -- Daily contact with patient to assess and evaluate symptoms and progress in treatment  -- Patient's case to be discussed in multi-disciplinary team meeting  -- Observation Level : q15 minute checks  -- Vital signs:  q12 hours  -- Precautions: suicide, elopement, and assault -- Encouraged patient to participate in unit milieu and in scheduled group therapies  2. Psychiatric Diagnoses and Treatment: Patient  2 physicians approved non emergent forced medications legal  guardian gave consent for Haldol  oral or IM  Zyprexa  15 mg BID  Haldol  10 mg bid added -  LAI haldol  Dec 100mg  Q4 weeks-07/26/2024 DSS gave consent-nonemergent forced medication consented by legal guardian  . Discharge Planning:   -- Social work and case management to assist with discharge planning and identification of hospital follow-up needs prior to discharge  -- Estimated LOS: 3-4 days  Allyn Foil, MD 08/12/2024, 12:56 PM "

## 2024-08-12 NOTE — Group Note (Signed)
 Date:  08/12/2024 Time:  2:15 PM  Group Topic/Focus:  Coping With Mental Health Crisis:   The purpose of this group is to help patients identify strategies for coping with mental health crisis.  Group discusses possible causes of crisis and ways to manage them effectively.    Participation Level:  Did Not Attend    [name] Bias 08/12/2024, 2:15 PM

## 2024-08-12 NOTE — Group Note (Signed)
 Date:  08/12/2024 Time:  10:02 AM  Group Topic/Focus:  Movement Therapy, Morning Stretch with Meridee Branum.    Participation Level:  Did Not Attend    [name] Bias 08/12/2024, 10:02 AM

## 2024-08-12 NOTE — Group Note (Signed)
 Date:  08/12/2024 Time:  4:15 PM  Group Topic/Focus:  Coping With Mental Health Crisis:   The purpose of this group is to help patients identify strategies for coping with mental health crisis.  Group discusses possible causes of crisis and ways to manage them effectively.  Today I facilitated a group activity that encouraged patient participation through a Family Feud-style game. Patients answered questions focused on enjoyable activities, hobbies, and places they like to go for fun. This activity promoted discussion about healthy coping mechanisms, leisure skills, and positive habits. Patients were encouraged to reflect on fun moments in their lives, with several emphasizing holidays such as Thanksgiving and Christmas, particularly in relation to cooking and spending time with family. Patients were engaged, cooperative, and actively participated throughout the group.    Participation Level:  Did Not Attend  Participation Quality:    Affect:    Cognitive:    Insight:   Engagement in Group:    Modes of Intervention:    Additional Comments:    Makaelyn Aponte L Laquesha Holcomb 08/12/2024, 4:15 PM

## 2024-08-12 NOTE — Plan of Care (Signed)
   Problem: Education: Goal: Emotional status will improve Outcome: Not Progressing Goal: Mental status will improve Outcome: Not Progressing Goal: Verbalization of understanding the information provided will improve Outcome: Not Progressing

## 2024-08-13 DIAGNOSIS — F200 Paranoid schizophrenia: Secondary | ICD-10-CM | POA: Diagnosis not present

## 2024-08-13 NOTE — Plan of Care (Signed)
   Problem: Education: Goal: Knowledge of [name] General Education information/materials will improve Outcome: Progressing Goal: Emotional status will improve Outcome: Progressing Goal: Mental status will improve Outcome: Progressing Goal: Verbalization of understanding the information provided will improve Outcome: Progressing   Problem: Activity: Goal: Interest or engagement in activities will improve Outcome: Progressing Goal: Sleeping patterns will improve Outcome: Progressing   Problem: Coping: Goal: Ability to verbalize frustrations and anger appropriately will improve Outcome: Progressing Goal: Ability to demonstrate self-control will improve Outcome: Progressing

## 2024-08-13 NOTE — Progress Notes (Signed)
" °   08/13/24 1100  Psych Admission Type (Psych Patients Only)  Admission Status Involuntary  Psychosocial Assessment  Patient Complaints None  Eye Contact Brief  Facial Expression Flat  Affect Flat  Speech Soft  Interaction Minimal  Motor Activity Slow  Appearance/Hygiene In scrubs  Behavior Characteristics Cooperative;Appropriate to situation  Mood Pleasant  Thought Process  Coherency WDL  Content WDL  Delusions None reported or observed  Perception WDL  Hallucination None reported or observed  Judgment Poor  Confusion Mild  Danger to Self  Current suicidal ideation? Denies  Danger to Others  Danger to Others None reported or observed    "

## 2024-08-13 NOTE — Plan of Care (Signed)
  Problem: Coping: Goal: Ability to demonstrate self-control will improve Outcome: Progressing   Problem: Activity: Goal: Interest or engagement in activities will improve Outcome: Not Progressing

## 2024-08-13 NOTE — Group Note (Signed)
 Date:  08/13/2024 Time:  8:43 PM  Group Topic/Focus:  Wrap-Up Group:   The focus of this group is to help patients review their daily goal of treatment and discuss progress on daily workbooks.    Participation Level:  Did Not Attend  Participation Quality:     Affect:     Cognitive:     Insight: None  Engagement in Group:     Modes of Intervention:     Additional Comments:    [name] 08/13/2024, 8:43 PM

## 2024-08-13 NOTE — Progress Notes (Signed)
 " [facility] MD Progress Note   7:17 PM [name]  MRN:  [id number]  [name] is a 64 y.o. female admitted: Medicallyfor 05/02/2024 10:56 AM for dehydration, failure to thrive and hypoglycemia. She carries the psychiatric diagnoses of Schizophrenia and has medical hx of hypoglycemia and failure to thrive and has a past medical history of sinus bradycardia, chronic anemia, severe malnutrition and recurrent hypoglycemia.. .    Subjective:  Chart reviewed, case discussed in multidisciplinary meeting, patient seen during rounds.   Patient is noted to be standing in her room.  Per nursing the room temperature is very cold but she refused to have it adjusted to warm temperature.  She remains irritable when asked about her mental health but is able to answer the questions.  She offers no other complaints.  Per nursing patient is taking her medications with no reported side effects   Past Psychiatric History: see h&P Family History: History reviewed. No pertinent family history. Social History:  Social History   Substance and Sexual Activity  Alcohol Use Not Currently   Comment: Refuses to disclose how much     Social History   Substance and Sexual Activity  Drug Use No   Comment: Refuses to answer    Social History   Socioeconomic History   Marital status: Single    Spouse name: Not on file   Number of children: Not on file   Years of education: Not on file   Highest education level: Not on file  Occupational History   Not on file  Tobacco Use   Smoking status: Every Day    Current packs/day: 2.00    Types: Cigarettes    Passive exposure: Current   Smokeless tobacco: Never  Vaping Use   Vaping status: Never Used  Substance and Sexual Activity   Alcohol use: Not Currently    Comment: Refuses to disclose how much   Drug use: No    Comment: Refuses to answer   Sexual activity: Not Currently    Comment: refused to answer  Other Topics Concern   Not on file  Social History  Narrative   Not on file   Social Drivers of Health   Tobacco Use: High Risk (05/06/2024)   Patient History    Smoking Tobacco Use: Every Day    Smokeless Tobacco Use: Never    Passive Exposure: Current  Financial Resource Strain: Not on file  Food Insecurity: Patient Declined (05/06/2024)   Epic    Worried About Programme Researcher, Broadcasting/film/video in the Last Year: Patient declined    Barista in the Last Year: Patient declined  Transportation Needs: Patient Declined (05/06/2024)   Epic    Lack of Transportation (Medical): Patient declined    Lack of Transportation (Non-Medical): Patient declined  Physical Activity: Not on file  Stress: Not on file  Social Connections: Not on file  Depression (PHQ2-9): Not on file  Alcohol Screen: Low Risk (05/06/2024)   Alcohol Screen    Last Alcohol Screening Score (AUDIT): 0  Housing: Unknown (05/06/2024)   Epic    Unable to Pay for Housing in the Last Year: Patient declined    Number of Times Moved in the Last Year: Not on file    Homeless in the Last Year: Not on file  Utilities: Patient Declined (05/06/2024)   Epic    Threatened with loss of utilities: Patient declined  Health Literacy: Not on file   Past Medical History:  Past Medical History:  Diagnosis Date   Anemia 10/02/2021   Non compliance w medication regimen    Schizophrenia [facility])     Past Surgical History:  Procedure Laterality Date   BIOPSY  09/25/2021   Procedure: BIOPSY;  Surgeon: Teressa Toribio SQUIBB, MD;  Location: [facility];  Service: Gastroenterology;;   ESOPHAGOGASTRODUODENOSCOPY (EGD) WITH PROPOFOL  [name] 09/25/2021   Procedure: ESOPHAGOGASTRODUODENOSCOPY (EGD) WITH PROPOFOL ;  Surgeon: Teressa Toribio SQUIBB, MD;  Location: [facility];  Service: Gastroenterology;  Laterality: [name];  With NGT placement    Current Medications: Current Facility-Administered Medications  Medication Dose Route Frequency Provider Last Rate Last Admin   acetaminophen  (TYLENOL ) tablet 650 mg  650 mg  Oral Q6H PRN Starkes-Perry, Takia S, FNP       alum & mag hydroxide-simeth (MAALOX/MYLANTA) 200-200-20 MG/5ML suspension 30 mL  30 mL Oral Q4H PRN Starkes-Perry, Takia S, FNP       haloperidol  (HALDOL ) tablet 5 mg  5 mg Oral BID Sterling Mondo, MD   5 mg at 08/13/24 0940   Or   haloperidol  lactate (HALDOL ) injection 10 mg  10 mg Intramuscular BID Margrette Wynia, MD       HYDROcodone -acetaminophen  (NORCO/VICODIN) 5-325 MG per tablet 1-2 tablet  1-2 tablet Oral Q4H PRN Starkes-Perry, Takia S, FNP       magnesium  hydroxide (MILK OF MAGNESIA) suspension 30 mL  30 mL Oral Daily PRN Starkes-Perry, Takia S, FNP       multivitamin with minerals tablet 1 tablet  1 tablet Oral Daily Wilkie Majel RAMAN, FNP   1 tablet at 08/13/24 0940   OLANZapine  (ZYPREXA ) injection 5 mg  5 mg Intramuscular TID PRN Wilkie Majel RAMAN, FNP       OLANZapine  (ZYPREXA ) tablet 15 mg  15 mg Oral BID Anjelique Makar, MD   15 mg at 08/13/24 9060   OLANZapine  zydis (ZYPREXA ) disintegrating tablet 5 mg  5 mg Oral TID PRN Starkes-Perry, Takia S, FNP       ondansetron  (ZOFRAN ) tablet 4 mg  4 mg Oral Q6H PRN Starkes-Perry, Majel RAMAN, FNP       Or   ondansetron  (ZOFRAN ) injection 4 mg  4 mg Intravenous Q6H PRN Starkes-Perry, Majel RAMAN, FNP       thiamine  (VITAMIN B1) tablet 100 mg  100 mg Oral Daily Wilkie Majel RAMAN, FNP   100 mg at 08/13/24 9060   traZODone  (DESYREL ) tablet 50 mg  50 mg Oral QHS Starkes-Perry, Takia S, FNP   50 mg at 08/12/24 2121    Lab Results:  No results found for this or any previous visit (from the past 48 hours).   Blood Alcohol level:  Lab Results  Component Value Date   [facility] <15 05/02/2024   ETH <15 05/01/2024    Metabolic Disorder Labs: Lab Results  Component Value Date   HGBA1C 5.6 05/14/2024   MPG 114.02 05/14/2024   MPG 116.89 05/02/2024   Lab Results  Component Value Date   PROLACTIN 13.6 04/16/2015   Lab Results  Component Value Date   CHOL 219 (H) 05/14/2024   TRIG 84  05/14/2024   HDL 83 05/14/2024   CHOLHDL 2.6 05/14/2024   VLDL 17 05/14/2024   LDLCALC 119 (H) 05/14/2024   LDLCALC 130 (H) 05/01/2024    Physical Findings: AIMS:  , ,  ,  ,    CIWA:    COWS:      Psychiatric Specialty Exam:  Presentation  General Appearance:  Casual  Eye Contact: Fair  Speech: Normal rate  Speech Volume: Increased    Mood and Affect  Mood: Angry  Affect: Congruent   Thought Process  Thought Processes: Disorganized  Descriptions of Associations: Concrete Orientation:Partial  Thought Content: Discharge focused Hallucinations:RESPONDING TO INTERNAL STIMULI Ideas of Reference:Paranoia; Delusions; Percusatory  Suicidal Thoughts:not endorsing Homicidal Thoughts:nor endorsing  Sensorium  Memory: Recent Poor  Judgment: Poor  Insight: Poor   Executive Functions  Concentration: Poor  Attention Span: Poor  Recall: Poor  Fund of Knowledge: Poor  Language: Poor   Psychomotor Activity  Psychomotor Activity:No data recorded Musculoskeletal: Strength & Muscle Tone: within normal limits Gait & Station: normal Assets  Assets: Resilience    Physical Exam: Physical Exam Vitals and nursing note reviewed.    ROS Blood pressure 124/67, pulse 66, temperature 97.9 F (36.6 C), resp. rate 14, height 5' 4 (1.626 m), weight 44.9 kg, SpO2 100%. Body mass index is 16.99 kg/m.  Diagnosis: Active Problems:   Schizophrenia, paranoid (HCC) Paranoid  PLAN: Safety and Monitoring:  -- Involuntary admission to inpatient psychiatric unit for safety, stabilization and treatment  -- Daily contact with patient to assess and evaluate symptoms and progress in treatment  -- Patient's case to be discussed in multi-disciplinary team meeting  -- Observation Level : q15 minute checks  -- Vital signs:  q12 hours  -- Precautions: suicide, elopement, and assault -- Encouraged patient to participate in unit milieu and in scheduled group  therapies  2. Psychiatric Diagnoses and Treatment: Patient  2 physicians approved non emergent forced medications legal guardian gave consent for Haldol  oral or IM  Zyprexa  15 mg BID  Haldol  10 mg bid added -  LAI haldol  Dec 100mg  Q4 weeks-07/26/2024 DSS gave consent-nonemergent forced medication consented by legal guardian  . Discharge Planning:   -- Social work and case management to assist with discharge planning and identification of hospital follow-up needs prior to discharge  -- Estimated LOS: 3-4 days  Allyn Foil, MD 08/13/2024, 7:17 PM "

## 2024-08-13 NOTE — Group Note (Signed)
 Date:  08/13/2024 Time:  10:32 AM  Group Topic/Focus:  Coping With Mental Health Crisis:   The purpose of this group is to help patients identify strategies for coping with mental health crisis.  Group discusses possible causes of crisis and ways to manage them effectively.  I led a group session focused on engagement, communication, and positive coping skills. The session began with an icebreaker activity, Two Truths and One Lie, which allowed participants the opportunity to learn more about one another and build rapport in a supportive environment.  I then guided the group through Would You Rather and What Would You Do activities to encourage critical thinking, decision-making, and discussion of real-life situations. Participants were prompted to explore positive perspectives, problem-solving strategies, and healthy responses to challenging circumstances.  Group members were engaged throughout the session, demonstrated appropriate interactions, and actively participated in discussions. Overall, the group showed cooperation, insight, and openness to identifying positive ways to approach difficult situations.   Participation Level:  Did Not Attend  Participation Quality:    Affect:    Cognitive:    Insight:   Engagement in Group:    Modes of Intervention:    Additional Comments:    Arraya Buck L Aerianna Losey 08/13/2024, 10:32 AM

## 2024-08-13 NOTE — Progress Notes (Signed)
 Patient was cooperative and pleasant during interaction with this nurse. Remained isolated in the room. Did not attended the wrap up session. Tolerated medications without any issue. Denies SI/HI/AVH. Slept for 9 hours.      08/12/24 2200  Psych Admission Type (Psych Patients Only)  Admission Status Involuntary  Psychosocial Assessment  Patient Complaints None  Eye Contact Brief  Facial Expression Flat  Affect Flat  Speech Soft  Interaction Minimal  Motor Activity Slow  Appearance/Hygiene In scrubs  Behavior Characteristics Cooperative  Mood Pleasant  Thought Process  Coherency WDL  Content WDL  Delusions None reported or observed  Perception WDL  Hallucination None reported or observed  Judgment Impaired  Confusion Mild  Danger to Self  Current suicidal ideation? Denies  Danger to Others  Danger to Others None reported or observed

## 2024-08-14 DIAGNOSIS — F200 Paranoid schizophrenia: Secondary | ICD-10-CM | POA: Diagnosis not present

## 2024-08-14 NOTE — BH IP Treatment Plan (Signed)
 Interdisciplinary Treatment and Diagnostic Plan Update  08/14/2024 Time of Session: 9:00 AM  [name] MRN: [id number]  Principal Diagnosis: <principal problem not specified>  Secondary Diagnoses: Active Problems:   Schizophrenia, paranoid (HCC)   Current Medications:  Current Facility-Administered Medications  Medication Dose Route Frequency Provider Last Rate Last Admin   acetaminophen  (TYLENOL ) tablet 650 mg  650 mg Oral Q6H PRN Starkes-Perry, Takia S, FNP       alum & mag hydroxide-simeth (MAALOX/MYLANTA) 200-200-20 MG/5ML suspension 30 mL  30 mL Oral Q4H PRN Starkes-Perry, Takia S, FNP       haloperidol  (HALDOL ) tablet 5 mg  5 mg Oral BID Jadapalle, Sree, MD   5 mg at 08/14/24 9065   Or   haloperidol  lactate (HALDOL ) injection 10 mg  10 mg Intramuscular BID Jadapalle, Sree, MD       HYDROcodone -acetaminophen  (NORCO/VICODIN) 5-325 MG per tablet 1-2 tablet  1-2 tablet Oral Q4H PRN Starkes-Perry, Takia S, FNP       magnesium  hydroxide (MILK OF MAGNESIA) suspension 30 mL  30 mL Oral Daily PRN Starkes-Perry, Takia S, FNP       multivitamin with minerals tablet 1 tablet  1 tablet Oral Daily Wilkie Majel RAMAN, FNP   1 tablet at 08/14/24 0935   OLANZapine  (ZYPREXA ) injection 5 mg  5 mg Intramuscular TID PRN Wilkie Majel RAMAN, FNP       OLANZapine  (ZYPREXA ) tablet 15 mg  15 mg Oral BID Jadapalle, Sree, MD   15 mg at 08/14/24 9065   OLANZapine  zydis (ZYPREXA ) disintegrating tablet 5 mg  5 mg Oral TID PRN Wilkie Majel RAMAN, FNP       ondansetron  (ZOFRAN ) tablet 4 mg  4 mg Oral Q6H PRN Starkes-Perry, Majel RAMAN, FNP       Or   ondansetron  (ZOFRAN ) injection 4 mg  4 mg Intravenous Q6H PRN Starkes-Perry, Majel RAMAN, FNP       thiamine  (VITAMIN B1) tablet 100 mg  100 mg Oral Daily Wilkie Majel RAMAN, FNP   100 mg at 08/14/24 9065   traZODone  (DESYREL ) tablet 50 mg  50 mg Oral QHS Starkes-Perry, Takia S, FNP   50 mg at 08/13/24 2135   PTA Medications: Medications Prior to  Admission  Medication Sig Dispense Refill Last Dose/Taking   feeding supplement (ENSURE PLUS HIGH PROTEIN) LIQD Take 237 mLs by mouth 3 (three) times daily between meals.      Multiple Vitamin (MULTIVITAMIN WITH MINERALS) TABS tablet Take 1 tablet by mouth daily.      OLANZapine  (ZYPREXA ) 5 MG tablet Take 1 tablet (5 mg total) by mouth 2 (two) times daily.      OLANZapine  (ZYPREXA ) injection Inject 1 mL (5 mg total) into the muscle 2 (two) times daily.      thiamine  (VITAMIN B-1) 100 MG tablet Take 1 tablet (100 mg total) by mouth daily.      traZODone  (DESYREL ) 50 MG tablet Take 1 tablet (50 mg total) by mouth at bedtime.       Patient Stressors:    Patient Strengths:    Treatment Modalities: Medication Management, Group therapy, Case management,  1 to 1 session with clinician, Psychoeducation, Recreational therapy.   Physician Treatment Plan for Primary Diagnosis: <principal problem not specified> Long Term Goal(s): Improvement in symptoms so as ready for discharge   Short Term Goals: Ability to identify changes in lifestyle to reduce recurrence of condition will improve  Medication Management: Evaluate patient's response, side effects, and tolerance of medication regimen.  Therapeutic Interventions: 1 to 1 sessions, Unit Group sessions and Medication administration.  Evaluation of Outcomes: Adequate for Discharge  Physician Treatment Plan for Secondary Diagnosis: Active Problems:   Schizophrenia, paranoid (HCC)  Long Term Goal(s): Improvement in symptoms so as ready for discharge   Short Term Goals: Ability to identify changes in lifestyle to reduce recurrence of condition will improve     Medication Management: Evaluate patient's response, side effects, and tolerance of medication regimen.  Therapeutic Interventions: 1 to 1 sessions, Unit Group sessions and Medication administration.  Evaluation of Outcomes: Adequate for Discharge   RN Treatment Plan for Primary  Diagnosis: <principal problem not specified> Long Term Goal(s): Knowledge of disease and therapeutic regimen to maintain health will improve  Short Term Goals: Ability to remain free from injury will improve, Ability to verbalize frustration and anger appropriately will improve, Ability to demonstrate self-control, Ability to participate in decision making will improve, Ability to verbalize feelings will improve, Ability to disclose and discuss suicidal ideas, Ability to identify and develop effective coping behaviors will improve, and Compliance with prescribed medications will improve  Medication Management: RN will administer medications as ordered by provider, will assess and evaluate patient's response and provide education to patient for prescribed medication. RN will report any adverse and/or side effects to prescribing provider.  Therapeutic Interventions: 1 on 1 counseling sessions, Psychoeducation, Medication administration, Evaluate responses to treatment, Monitor vital signs and CBGs as ordered, Perform/monitor CIWA, COWS, AIMS and Fall Risk screenings as ordered, Perform wound care treatments as ordered.  Evaluation of Outcomes: Adequate for Discharge   LCSW Treatment Plan for Primary Diagnosis: <principal problem not specified> Long Term Goal(s): Safe transition to appropriate next level of care at discharge, Engage patient in therapeutic group addressing interpersonal concerns.  Short Term Goals: Engage patient in aftercare planning with referrals and resources, Increase social support, Increase ability to appropriately verbalize feelings, Increase emotional regulation, Facilitate acceptance of mental health diagnosis and concerns, Facilitate patient progression through stages of change regarding substance use diagnoses and concerns, Identify triggers associated with mental health/substance abuse issues, and Increase skills for wellness and recovery  Therapeutic Interventions: Assess  for all discharge needs, 1 to 1 time with Social worker, Explore available resources and support systems, Assess for adequacy in community support network, Educate family and significant other(s) on suicide prevention, Complete Psychosocial Assessment, Interpersonal group therapy.  Evaluation of Outcomes: Adequate for Discharge   Progress in Treatment: Attending groups: No. Participating in groups: No. Taking medication as prescribed: Yes. Toleration medication: Yes. Family/Significant other contact made: Yes, individual(s) contacted:  SPE completed with the patient and DSS Patient understands diagnosis: No. Discussing patient identified problems/goals with staff: No. Medical problems stabilized or resolved: Yes. Denies suicidal/homicidal ideation: Yes. Issues/concerns per patient self-inventory: No. Other: none   New problem(s) identified: No, Describe:  none   New Short Term/Long Term Goal(s):elimination of symptoms of psychosis, medication management for mood stabilization; elimination of SI thoughts; development of comprehensive mental wellness plan. Update: 05/13/2024 No changes at this time.Update: 05/18/2024 No changes at this time. Update 05/23/2024:  No changes at this time. Update 05/28/2024:  No changes at this time. Update 06/02/2024:  No changes at this time. Update 06/07/2024:  No changes at this time. 06/12/24 Update: No changes at this time. Update 06/18/24:  No changes at this time  Update 06/24/24:  No changes at this time Update 06/29/24:  No changes at this time 07/04/24 Update: No changes. 07/09/24: No change 07/14/2024: No changes at  this time.  Update 07/19/24: No changes at this time Update 07/25/23: No changes at this time Update 07/29/24 no changes  Update 08/03/2024:  No changes at this time.  Update 08/08/2024: No changes at this time. Update 08/14/2024: No changes at this time.    Patient Goals:    I just need to leave, I don't need it hereUpdate: 05/13/2024 No  changes at this time.Update: 05/18/2024 No changes at this time. Update 05/23/2024:  No changes at this time.Update 05/28/2024:  No changes at this time. Update 06/02/2024:  No changes at this time. Update 06/07/2024:  No changes at this time. 06/12/24 Update: No changes at this time. Update 06/18/24:  No changes at this time Update 06/24/24:  No changes at this time Update 06/29/24:  No changes at this time 07/04/24 Update: No changes. 07/09/24: No changes at this time.  07/14/2024: No changes at this time.   Update 07/19/24: No changes at this time Update 07/25/23: No changes at this time Update: 07/29/24  Update 08/03/2024:  No changes at this time. Update 08/08/2024: No changes at this time. Update 08/14/2024: No changes at this time.     Discharge Plan or Barriers:  CSW will assist with appropriate discharge planning. Update: 05/13/2024 No changes at this time.Update: 05/18/2024 No changes at this time. Update 05/23/2024:  No changes at this time.Update 05/28/2024:  CSW unable to identify appropriate decision maker, as pt's legal guardian has passed and CSW has reached out to APS caseworker multiple times to get clarity on protocol for enforcing medication until an appropriate party has been legal identified Update 06/02/2024:  No changes at this time. Update 06/07/2024:  CSW and provider still working to clarify appropriate horticulturist, commercial. Please refer to extensive notes in medical record. 06/12/24 Update: CSW and provider still working with DSS to obtain legal decision maker on patient's behalf. Update 06/18/24:  No changes at this time Update 06/24/24:  DSS received ex-parte, DSS to begin looking for placement, Dr.J to initiate enforced medication Update 06/29/24:  No changes at this time 07/04/24 Update: CSW to continue to provide support to APS case worker while DSS works on clinical cytogeneticist. No changes. 07/09/24 07/14/2024: Possible bed at [facility], CSW team to follow up with  guardian regarding this.   Update 07/19/24: No changes at this time  Update 07/25/23: No changes at this time  Update 07/29/24 No changes at this time.  Update 08/03/2024:  Placement is still being sought.  Update 08/08/2024: Placement remains a barrier at this time. Update 08/14/2024: No changes at this time.    Reason for Continuation of Hospitalization: Aggression Hallucinations   Estimated Length of Stay:TBD   Update 08/03/2024:  TBD Update 08/08/2024: TBD Update 08/14/2024: TBD  Last 3 [place] Suicide Severity Risk Score: Flowsheet Row Admission (Current) from 05/06/2024 in [facility] to Hosp-Admission (Discharged) from 05/02/2024 in [place] [facility] ED from 04/30/2024 in [facility]  C-SSRS RISK CATEGORY No Risk No Risk No Risk    Last [facility] 2/9 Scores:     No data to display          Scribe for Treatment Team: [name], [name] 08/14/2024 12:05 PM

## 2024-08-14 NOTE — Progress Notes (Signed)
 Behavior:   Pleasant and cooperative.  Present in milieu before meals.   Psych assessment: Denies SI/HI.  Group attendance:  0/1  Medication/ PRNs: Compliant.  Pain: Denies.  15 min checks in place for safety.

## 2024-08-14 NOTE — Progress Notes (Signed)
 " [facility] MD Progress Note   1:10 PM [name]  MRN:  [id number]  [name] is a 64 y.o. female admitted: Medicallyfor 05/02/2024 10:56 AM for dehydration, failure to thrive and hypoglycemia. She carries the psychiatric diagnoses of Schizophrenia and has medical hx of hypoglycemia and failure to thrive and has a past medical history of sinus bradycardia, chronic anemia, severe malnutrition and recurrent hypoglycemia.. .    Subjective:  Chart reviewed, case discussed in multidisciplinary meeting, patient seen during rounds.    Patient is noted to be resting in bed.  She offers no complaints.  She denies any physical discomfort or pain.  Per nursing patient remains compliant with her medications.  She comes out intermittently for groups and food.  Patient has not displayed any behavioral outbursts or arguments during her hospital stay.  She continues to refuse to engage in conversations about her discharge planning, legal guardianship and placement. Past Psychiatric History: see h&P Family History: History reviewed. No pertinent family history. Social History:  Social History   Substance and Sexual Activity  Alcohol Use Not Currently   Comment: Refuses to disclose how much     Social History   Substance and Sexual Activity  Drug Use No   Comment: Refuses to answer    Social History   Socioeconomic History   Marital status: Single    Spouse name: Not on file   Number of children: Not on file   Years of education: Not on file   Highest education level: Not on file  Occupational History   Not on file  Tobacco Use   Smoking status: Every Day    Current packs/day: 2.00    Types: Cigarettes    Passive exposure: Current   Smokeless tobacco: Never  Vaping Use   Vaping status: Never Used  Substance and Sexual Activity   Alcohol use: Not Currently    Comment: Refuses to disclose how much   Drug use: No    Comment: Refuses to answer   Sexual activity: Not Currently    Comment:  refused to answer  Other Topics Concern   Not on file  Social History Narrative   Not on file   Social Drivers of Health   Tobacco Use: High Risk (05/06/2024)   Patient History    Smoking Tobacco Use: Every Day    Smokeless Tobacco Use: Never    Passive Exposure: Current  Financial Resource Strain: Not on file  Food Insecurity: Patient Declined (05/06/2024)   Epic    Worried About Programme Researcher, Broadcasting/film/video in the Last Year: Patient declined    Barista in the Last Year: Patient declined  Transportation Needs: Patient Declined (05/06/2024)   Epic    Lack of Transportation (Medical): Patient declined    Lack of Transportation (Non-Medical): Patient declined  Physical Activity: Not on file  Stress: Not on file  Social Connections: Not on file  Depression (PHQ2-9): Not on file  Alcohol Screen: Low Risk (05/06/2024)   Alcohol Screen    Last Alcohol Screening Score (AUDIT): 0  Housing: Unknown (05/06/2024)   Epic    Unable to Pay for Housing in the Last Year: Patient declined    Number of Times Moved in the Last Year: Not on file    Homeless in the Last Year: Not on file  Utilities: Patient Declined (05/06/2024)   Epic    Threatened with loss of utilities: Patient declined  Health Literacy: Not on file   Past Medical  History:  Past Medical History:  Diagnosis Date   Anemia 10/02/2021   Non compliance w medication regimen    Schizophrenia [name])     Past Surgical History:  Procedure Laterality Date   BIOPSY  09/25/2021   Procedure: BIOPSY;  Surgeon: Teressa Toribio SQUIBB, MD;  Location: [facility];  Service: Gastroenterology;;   ESOPHAGOGASTRODUODENOSCOPY (EGD) WITH PROPOFOL  [name] 09/25/2021   Procedure: ESOPHAGOGASTRODUODENOSCOPY (EGD) WITH PROPOFOL ;  Surgeon: Teressa Toribio SQUIBB, MD;  Location: [facility];  Service: Gastroenterology;  Laterality: [name];  With NGT placement    Current Medications: Current Facility-Administered Medications  Medication Dose Route Frequency Provider  Last Rate Last Admin   acetaminophen  (TYLENOL ) tablet 650 mg  650 mg Oral Q6H PRN Starkes-Perry, Takia S, FNP       alum & mag hydroxide-simeth (MAALOX/MYLANTA) 200-200-20 MG/5ML suspension 30 mL  30 mL Oral Q4H PRN Starkes-Perry, Takia S, FNP       haloperidol  (HALDOL ) tablet 5 mg  5 mg Oral BID Kendalynn Wideman, MD   5 mg at 08/14/24 9065   Or   haloperidol  lactate (HALDOL ) injection 10 mg  10 mg Intramuscular BID Corey Laski, MD       HYDROcodone -acetaminophen  (NORCO/VICODIN) 5-325 MG per tablet 1-2 tablet  1-2 tablet Oral Q4H PRN Starkes-Perry, Takia S, FNP       magnesium  hydroxide (MILK OF MAGNESIA) suspension 30 mL  30 mL Oral Daily PRN Starkes-Perry, Takia S, FNP       multivitamin with minerals tablet 1 tablet  1 tablet Oral Daily Wilkie Majel RAMAN, FNP   1 tablet at 08/14/24 0935   OLANZapine  (ZYPREXA ) injection 5 mg  5 mg Intramuscular TID PRN Wilkie Majel RAMAN, FNP       OLANZapine  (ZYPREXA ) tablet 15 mg  15 mg Oral BID Rachael Ferrie, MD   15 mg at 08/14/24 9065   OLANZapine  zydis (ZYPREXA ) disintegrating tablet 5 mg  5 mg Oral TID PRN Wilkie Majel RAMAN, FNP       ondansetron  (ZOFRAN ) tablet 4 mg  4 mg Oral Q6H PRN Starkes-Perry, Majel RAMAN, FNP       Or   ondansetron  (ZOFRAN ) injection 4 mg  4 mg Intravenous Q6H PRN Starkes-Perry, Takia S, FNP       thiamine  (VITAMIN B1) tablet 100 mg  100 mg Oral Daily Starkes-Perry, Takia S, FNP   100 mg at 08/14/24 9065   traZODone  (DESYREL ) tablet 50 mg  50 mg Oral QHS Starkes-Perry, Takia S, FNP   50 mg at 08/13/24 2135    Lab Results:  No results found for this or any previous visit (from the past 48 hours).   Blood Alcohol level:  Lab Results  Component Value Date   [facility] <15 05/02/2024   ETH <15 05/01/2024    Metabolic Disorder Labs: Lab Results  Component Value Date   HGBA1C 5.6 05/14/2024   MPG 114.02 05/14/2024   MPG 116.89 05/02/2024   Lab Results  Component Value Date   PROLACTIN 13.6 04/16/2015   Lab  Results  Component Value Date   CHOL 219 (H) 05/14/2024   TRIG 84 05/14/2024   HDL 83 05/14/2024   CHOLHDL 2.6 05/14/2024   VLDL 17 05/14/2024   LDLCALC 119 (H) 05/14/2024   LDLCALC 130 (H) 05/01/2024    Physical Findings: AIMS:  , ,  ,  ,    CIWA:    COWS:      Psychiatric Specialty Exam:  Presentation  General Appearance:  Casual  Eye  Contact: Fair  Speech: Normal rate  Speech Volume: Increased    Mood and Affect  Mood: Angry  Affect: Congruent   Thought Process  Thought Processes: Disorganized  Descriptions of Associations: Concrete Orientation:Partial  Thought Content: Discharge focused Hallucinations:RESPONDING TO INTERNAL STIMULI Ideas of Reference:Paranoia; Delusions; Percusatory  Suicidal Thoughts:not endorsing Homicidal Thoughts:nor endorsing  Sensorium  Memory: Recent Poor  Judgment: Poor  Insight: Poor   Executive Functions  Concentration: Poor  Attention Span: Poor  Recall: Poor  Fund of Knowledge: Poor  Language: Poor   Psychomotor Activity  Psychomotor Activity:No data recorded Musculoskeletal: Strength & Muscle Tone: within normal limits Gait & Station: normal Assets  Assets: Resilience    Physical Exam: Physical Exam Vitals and nursing note reviewed.    ROS Blood pressure 114/64, pulse 67, temperature (!) 97 F (36.1 C), resp. rate 14, height 5' 4 (1.626 m), weight 44.9 kg, SpO2 100%. Body mass index is 16.99 kg/m.  Diagnosis: Active Problems:   Schizophrenia, paranoid (HCC) Paranoid  PLAN: Safety and Monitoring:  -- Involuntary admission to inpatient psychiatric unit for safety, stabilization and treatment  -- Daily contact with patient to assess and evaluate symptoms and progress in treatment  -- Patient's case to be discussed in multi-disciplinary team meeting  -- Observation Level : q15 minute checks  -- Vital signs:  q12 hours  -- Precautions: suicide, elopement, and assault --  Encouraged patient to participate in unit milieu and in scheduled group therapies  2. Psychiatric Diagnoses and Treatment: Patient  2 physicians approved non emergent forced medications legal guardian gave consent for Haldol  oral or IM  Zyprexa  15 mg BID  Haldol  10 mg bid added -  LAI haldol  Dec 100mg  Q4 weeks-07/26/2024 DSS gave consent-nonemergent forced medication consented by legal guardian  . Discharge Planning:   -- Social work and case management to assist with discharge planning and identification of hospital follow-up needs prior to discharge  -- Estimated LOS: 3-4 days  Delmos Velaquez, MD 08/14/2024, 1:10 PM "

## 2024-08-14 NOTE — Group Note (Signed)
 Date:  08/14/2024 Time:  9:27 PM  Group Topic/Focus:  Wrap-Up Group:   The focus of this group is to help patients review their daily goal of treatment and discuss progress on daily workbooks.    Participation Level:  Did Not Attend  Participation Quality:     Affect:     Cognitive:     Insight: None  Engagement in Group:  None  Modes of Intervention:     Additional Comments:    [name] 08/14/2024, 9:27 PM

## 2024-08-14 NOTE — Plan of Care (Signed)
  Problem: Coping: Goal: Ability to demonstrate self-control will improve Outcome: Progressing   Problem: Activity: Goal: Interest or engagement in activities will improve Outcome: Not Progressing

## 2024-08-14 NOTE — Group Note (Signed)
 Date:  08/14/2024 Time:  9:50 AM  Group Topic/Focus:  Movement Therapy.    Participation Level:  Did Not Attend    [name] Bias 08/14/2024, 9:50 AM

## 2024-08-15 DIAGNOSIS — F200 Paranoid schizophrenia: Secondary | ICD-10-CM | POA: Diagnosis not present

## 2024-08-15 NOTE — Group Note (Signed)
 Date:  08/15/2024 Time:  11:00 AM  Group Topic/Focus:  Healthy Communication:   The focus of this group is to discuss communication, barriers to communication, as well as healthy ways to communicate with others.    Participation Level:  Did Not Attend    [name] 08/15/2024, 11:00 AM

## 2024-08-15 NOTE — Progress Notes (Signed)
 " [facility] MD Progress Note   12:36 PM [name]  MRN:  [id number]  [name] is a 64 y.o. female admitted: Medicallyfor 05/02/2024 10:56 AM for dehydration, failure to thrive and hypoglycemia. She carries the psychiatric diagnoses of Schizophrenia and has medical hx of hypoglycemia and failure to thrive and has a past medical history of sinus bradycardia, chronic anemia, severe malnutrition and recurrent hypoglycemia.. .    Subjective:  Chart reviewed, case discussed in multidisciplinary meeting, patient seen during rounds.   Patient is noted to be resting in her bed.  She offers no complaints.  She denies any physical complaints.  Patient denies SI/HI/plan and denies hallucinations.  Patient has fair appetite and sleep.  She remains calm and cooperative.  Past Psychiatric History: see h&P Family History: History reviewed. No pertinent family history. Social History:  Social History   Substance and Sexual Activity  Alcohol Use Not Currently   Comment: Refuses to disclose how much     Social History   Substance and Sexual Activity  Drug Use No   Comment: Refuses to answer    Social History   Socioeconomic History   Marital status: Single    Spouse name: Not on file   Number of children: Not on file   Years of education: Not on file   Highest education level: Not on file  Occupational History   Not on file  Tobacco Use   Smoking status: Every Day    Current packs/day: 2.00    Types: Cigarettes    Passive exposure: Current   Smokeless tobacco: Never  Vaping Use   Vaping status: Never Used  Substance and Sexual Activity   Alcohol use: Not Currently    Comment: Refuses to disclose how much   Drug use: No    Comment: Refuses to answer   Sexual activity: Not Currently    Comment: refused to answer  Other Topics Concern   Not on file  Social History Narrative   Not on file   Social Drivers of Health   Tobacco Use: High Risk (05/06/2024)   Patient History    Smoking  Tobacco Use: Every Day    Smokeless Tobacco Use: Never    Passive Exposure: Current  Financial Resource Strain: Not on file  Food Insecurity: Patient Declined (05/06/2024)   Epic    Worried About Programme Researcher, Broadcasting/film/video in the Last Year: Patient declined    Barista in the Last Year: Patient declined  Transportation Needs: Patient Declined (05/06/2024)   Epic    Lack of Transportation (Medical): Patient declined    Lack of Transportation (Non-Medical): Patient declined  Physical Activity: Not on file  Stress: Not on file  Social Connections: Not on file  Depression (PHQ2-9): Not on file  Alcohol Screen: Low Risk (05/06/2024)   Alcohol Screen    Last Alcohol Screening Score (AUDIT): 0  Housing: Unknown (05/06/2024)   Epic    Unable to Pay for Housing in the Last Year: Patient declined    Number of Times Moved in the Last Year: Not on file    Homeless in the Last Year: Not on file  Utilities: Patient Declined (05/06/2024)   Epic    Threatened with loss of utilities: Patient declined  Health Literacy: Not on file   Past Medical History:  Past Medical History:  Diagnosis Date   Anemia 10/02/2021   Non compliance w medication regimen    Schizophrenia [facility])     Past Surgical  History:  Procedure Laterality Date   BIOPSY  09/25/2021   Procedure: BIOPSY;  Surgeon: Teressa Toribio SQUIBB, MD;  Location: [facility];  Service: Gastroenterology;;   ESOPHAGOGASTRODUODENOSCOPY (EGD) WITH PROPOFOL  [name] 09/25/2021   Procedure: ESOPHAGOGASTRODUODENOSCOPY (EGD) WITH PROPOFOL ;  Surgeon: Teressa Toribio SQUIBB, MD;  Location: [facility];  Service: Gastroenterology;  Laterality: [name];  With NGT placement    Current Medications: Current Facility-Administered Medications  Medication Dose Route Frequency Provider Last Rate Last Admin   acetaminophen  (TYLENOL ) tablet 650 mg  650 mg Oral Q6H PRN Starkes-Perry, Takia S, FNP       alum & mag hydroxide-simeth (MAALOX/MYLANTA) 200-200-20 MG/5ML suspension 30 mL   30 mL Oral Q4H PRN Starkes-Perry, Takia S, FNP       haloperidol  (HALDOL ) tablet 5 mg  5 mg Oral BID Mayrin Schmuck, MD   5 mg at 08/15/24 1039   Or   haloperidol  lactate (HALDOL ) injection 10 mg  10 mg Intramuscular BID Ita Fritzsche, MD       HYDROcodone -acetaminophen  (NORCO/VICODIN) 5-325 MG per tablet 1-2 tablet  1-2 tablet Oral Q4H PRN Starkes-Perry, Takia S, FNP       magnesium  hydroxide (MILK OF MAGNESIA) suspension 30 mL  30 mL Oral Daily PRN Starkes-Perry, Takia S, FNP       multivitamin with minerals tablet 1 tablet  1 tablet Oral Daily Wilkie Majel RAMAN, FNP   1 tablet at 08/15/24 1038   OLANZapine  (ZYPREXA ) injection 5 mg  5 mg Intramuscular TID PRN Wilkie Majel RAMAN, FNP       OLANZapine  (ZYPREXA ) tablet 15 mg  15 mg Oral BID Danila Eddie, MD   15 mg at 08/15/24 1039   OLANZapine  zydis (ZYPREXA ) disintegrating tablet 5 mg  5 mg Oral TID PRN Starkes-Perry, Takia S, FNP       ondansetron  (ZOFRAN ) tablet 4 mg  4 mg Oral Q6H PRN Starkes-Perry, Majel RAMAN, FNP       Or   ondansetron  (ZOFRAN ) injection 4 mg  4 mg Intravenous Q6H PRN Starkes-Perry, Majel RAMAN, FNP       thiamine  (VITAMIN B1) tablet 100 mg  100 mg Oral Daily Wilkie Majel RAMAN, FNP   100 mg at 08/15/24 1039   traZODone  (DESYREL ) tablet 50 mg  50 mg Oral QHS Starkes-Perry, Takia S, FNP   50 mg at 08/14/24 2112    Lab Results:  No results found for this or any previous visit (from the past 48 hours).   Blood Alcohol level:  Lab Results  Component Value Date   [facility] <15 05/02/2024   ETH <15 05/01/2024    Metabolic Disorder Labs: Lab Results  Component Value Date   HGBA1C 5.6 05/14/2024   MPG 114.02 05/14/2024   MPG 116.89 05/02/2024   Lab Results  Component Value Date   PROLACTIN 13.6 04/16/2015   Lab Results  Component Value Date   CHOL 219 (H) 05/14/2024   TRIG 84 05/14/2024   HDL 83 05/14/2024   CHOLHDL 2.6 05/14/2024   VLDL 17 05/14/2024   LDLCALC 119 (H) 05/14/2024   LDLCALC 130 (H)  05/01/2024    Physical Findings: AIMS:  , ,  ,  ,    CIWA:    COWS:      Psychiatric Specialty Exam:  Presentation  General Appearance:  Casual  Eye Contact: Fair  Speech: Normal rate  Speech Volume: Increased    Mood and Affect  Mood: Angry  Affect: Congruent   Thought Process  Thought Processes: Disorganized  Descriptions of Associations: Concrete Orientation:Partial  Thought Content: Discharge focused Hallucinations:RESPONDING TO INTERNAL STIMULI Ideas of Reference:Paranoia; Delusions; Percusatory  Suicidal Thoughts:not endorsing Homicidal Thoughts:nor endorsing  Sensorium  Memory: Recent Poor  Judgment: Poor  Insight: Poor   Executive Functions  Concentration: Poor  Attention Span: Poor  Recall: Poor  Fund of Knowledge: Poor  Language: Poor   Psychomotor Activity  Psychomotor Activity:No data recorded Musculoskeletal: Strength & Muscle Tone: within normal limits Gait & Station: normal Assets  Assets: Resilience    Physical Exam: Physical Exam Vitals and nursing note reviewed.    ROS Blood pressure 109/65, pulse 64, temperature (!) 97.5 F (36.4 C), resp. rate 16, height 5' 4 (1.626 m), weight 44.9 kg, SpO2 100%. Body mass index is 16.99 kg/m.  Diagnosis: Active Problems:   Schizophrenia, paranoid (HCC) Paranoid  PLAN: Safety and Monitoring:  -- Involuntary admission to inpatient psychiatric unit for safety, stabilization and treatment  -- Daily contact with patient to assess and evaluate symptoms and progress in treatment  -- Patient's case to be discussed in multi-disciplinary team meeting  -- Observation Level : q15 minute checks  -- Vital signs:  q12 hours  -- Precautions: suicide, elopement, and assault -- Encouraged patient to participate in unit milieu and in scheduled group therapies  2. Psychiatric Diagnoses and Treatment: Patient  2 physicians approved non emergent forced medications legal  guardian gave consent for Haldol  oral or IM  Zyprexa  15 mg BID  Haldol  10 mg bid added -  LAI haldol  Dec 100mg  Q4 weeks-07/26/2024 DSS gave consent-nonemergent forced medication consented by legal guardian  . Discharge Planning:   -- Social work and case management to assist with discharge planning and identification of hospital follow-up needs prior to discharge  -- Estimated LOS: 3-4 days  Allyn Foil, MD 08/15/2024, 12:36 PM "

## 2024-08-15 NOTE — Group Note (Signed)
 Recreation Therapy Group Note   Group Topic:Leisure Education  Group Date: 08/15/2024 Start Time: 1400 End Time: 1440 Facilitators: Celestia Jeoffrey BRAVO, LRT, CTRS Location: Dayroom  Group Description: Bingo. Patients played multiple rounds of bingo. LRT and pts discussed the definition of leisure, things they do in their free time outside of the hospital, and how bingo is also a leisure activity. Pts received a coloring book, word search book, or journal as a prize.     Goal Area(s) Addressed:    Patient will identify a current leisure interest.    Patient will learn the definition of leisure.   Patient will have the opportunity to try a new leisure activity.   Patient will communicate with peers and LRT.    Affect/Mood: [name]   Participation Level: Did not attend    Clinical Observations/Individualized Feedback: Patient did not attend.   Plan: Continue to engage patient in RT group sessions 2-3x/week.   Jeoffrey BRAVO Celestia, LRT, CTRS 08/15/2024 2:47 PM

## 2024-08-15 NOTE — BHH Counselor (Signed)
 CSW contacted pt's Ms.Singletary at [facility] [place] to see if meeting with pt for placement can be rescheduled as pt was supposed to be seen Monday, but there was inclement weather.    CSW left HIPAA compliant VM requesting return call.   [name] Croft, MSW, [place] 08/15/2024 12:32 PM

## 2024-08-15 NOTE — Progress Notes (Signed)
 Pt denies SI/HI/AVH. She denies physical complaints this shift. Q 15 min safety checks in place.   08/14/24 2112  Psych Admission Type (Psych Patients Only)  Admission Status Involuntary  Psychosocial Assessment  Patient Complaints None  Eye Contact Brief  Facial Expression Sullen  Affect Sullen  Speech Logical/coherent  Interaction Minimal;Isolative  Motor Activity Slow  Appearance/Hygiene Unremarkable  Behavior Characteristics Guarded  Mood Preoccupied  Thought Process  Coherency WDL  Content WDL  Delusions None reported or observed  Perception WDL  Hallucination None reported or observed  Judgment Poor  Confusion Mild  Danger to Self  Current suicidal ideation? Denies  Danger to Others  Danger to Others None reported or observed

## 2024-08-15 NOTE — Group Note (Signed)
 LCSW Group Therapy Note    Group Date: 08/15/2024 Start Time: 1300 End Time: 1400   Type of Therapy and Topic: Group Therapy: Body Image  Participation Level:  Minimal  Description of Group:  Patients were educated about body image and asked to think about whether they have a healthy or unhealthy body image. Patients were led in a discussion about factors that contribute to body image, both internal and external. Patients were asked to discuss strengths of the human body outside of appearance, such as being able to fight off diseases and provide stress relief. Lastly, patients were asked to identify one way in which they appreciate their own body outside of appearance.   Therapeutic Goals:   1. Patient will differentiate between a healthy and unhealthy body image. 2. Patient will identify what contributes to body image 3. Patient will discuss the strengths of the human body. 4. Patient will identify a positive attribute of their body outside of physical appearance.  Summary of Patient Progress:   Patients were educated about body image and asked to think about whether they have a healthy or unhealthy body image. Patients were led in a discussion about factors that contribute to body image, both internal and external. Patients were asked to discuss strengths of the human body outside of appearance, such as being able to fight off diseases and provide stress relief. Lastly, patients were asked to identify one way in which they appreciate their own body outside of appearance.     Therapeutic Modalities: Cognitive Behavioral Therapy; Solution-Focused Therapy  Barb Shear M Trevone Prestwood, LCSWA 08/15/2024  2:00 PM

## 2024-08-15 NOTE — Progress Notes (Signed)
 Behavior:  Pleasant and cooperative.  Present in milieu prior to meals.    Psych assessment: Denies SI/HI and AVH.   Group attendance:  0/3  Medication/ PRNs: Compliant.  Pain: Denies.  15 min checks in place for safety.

## 2024-08-15 NOTE — Plan of Care (Signed)
   Problem: Education: Goal: Emotional status will improve Outcome: Progressing Goal: Mental status will improve Outcome: Progressing Goal: Verbalization of understanding the information provided will improve Outcome: Progressing   Problem: Activity: Goal: Interest or engagement in activities will improve Outcome: Progressing Goal: Sleeping patterns will improve Outcome: Progressing

## 2024-08-15 NOTE — Plan of Care (Signed)
  Problem: Health Behavior/Discharge Planning: Goal: Compliance with treatment plan for underlying cause of condition will improve Outcome: Progressing   Problem: Activity: Goal: Interest or engagement in activities will improve Outcome: Not Progressing   

## 2024-08-15 NOTE — Group Note (Signed)
 Date:  08/15/2024 Time:  9:01 PM  Group Topic/Focus:  Wrap-Up Group:   The focus of this group is to help patients review their daily goal of treatment and discuss progress on daily workbooks.    Participation Level:  Did Not Attend  Participation Quality:     Affect:     Cognitive:     Insight: None  Engagement in Group:  None  Modes of Intervention:     Additional Comments:    [name] 08/15/2024, 9:01 PM

## 2024-08-16 DIAGNOSIS — F200 Paranoid schizophrenia: Secondary | ICD-10-CM | POA: Diagnosis not present

## 2024-08-16 NOTE — Group Note (Signed)
 Date:  08/16/2024 Time:  11:18 AM  Group Topic/Focus:  Recovery Goals:   The focus of this group is to identify appropriate goals for recovery and establish a plan to achieve them.    Participation Level:  Did Not Attend   [name] 08/16/2024, 11:18 AM

## 2024-08-16 NOTE — Progress Notes (Signed)
" °   08/16/24 1300  Psych Admission Type (Psych Patients Only)  Admission Status Involuntary  Psychosocial Assessment  Patient Complaints None  Eye Contact Brief  Facial Expression Flat  Affect Flat  Speech Soft  Interaction Minimal  Motor Activity Restless  Appearance/Hygiene In scrubs  Behavior Characteristics Cooperative  Mood Pleasant  Thought Process  Coherency WDL  Content WDL  Delusions None reported or observed  Perception WDL  Hallucination None reported or observed  Judgment Poor  Confusion Mild  Danger to Self  Current suicidal ideation? Denies  Danger to Others  Danger to Others None reported or observed    "

## 2024-08-16 NOTE — Plan of Care (Signed)
  Problem: Activity: Goal: Sleeping patterns will improve Outcome: Progressing   

## 2024-08-16 NOTE — Progress Notes (Signed)
 " [facility] MD Progress Note   11:30 AM [name]  MRN:  [id number]  [name] is a 64 y.o. female admitted: Medicallyfor 05/02/2024 10:56 AM for dehydration, failure to thrive and hypoglycemia. She carries the psychiatric diagnoses of Schizophrenia and has medical hx of hypoglycemia and failure to thrive and has a past medical history of sinus bradycardia, chronic anemia, severe malnutrition and recurrent hypoglycemia.. .    Subjective:  Chart reviewed, case discussed in multidisciplinary meeting, patient seen during rounds.   Patient is noted to be sitting in the day area and attending the morning groups.  She offers no complaints.  Her room is cold and the staff offered her blankets but she declined.  She gets irritable when questions related to mental health are asked but declines having any physical complaints today  Past Psychiatric History: see h&P Family History: History reviewed. No pertinent family history. Social History:  Social History   Substance and Sexual Activity  Alcohol Use Not Currently   Comment: Refuses to disclose how much     Social History   Substance and Sexual Activity  Drug Use No   Comment: Refuses to answer    Social History   Socioeconomic History   Marital status: Single    Spouse name: Not on file   Number of children: Not on file   Years of education: Not on file   Highest education level: Not on file  Occupational History   Not on file  Tobacco Use   Smoking status: Every Day    Current packs/day: 2.00    Types: Cigarettes    Passive exposure: Current   Smokeless tobacco: Never  Vaping Use   Vaping status: Never Used  Substance and Sexual Activity   Alcohol use: Not Currently    Comment: Refuses to disclose how much   Drug use: No    Comment: Refuses to answer   Sexual activity: Not Currently    Comment: refused to answer  Other Topics Concern   Not on file  Social History Narrative   Not on file   Social Drivers of Health    Tobacco Use: High Risk (05/06/2024)   Patient History    Smoking Tobacco Use: Every Day    Smokeless Tobacco Use: Never    Passive Exposure: Current  Financial Resource Strain: Not on file  Food Insecurity: Patient Declined (05/06/2024)   Epic    Worried About Programme Researcher, Broadcasting/film/video in the Last Year: Patient declined    Barista in the Last Year: Patient declined  Transportation Needs: Patient Declined (05/06/2024)   Epic    Lack of Transportation (Medical): Patient declined    Lack of Transportation (Non-Medical): Patient declined  Physical Activity: Not on file  Stress: Not on file  Social Connections: Not on file  Depression (PHQ2-9): Not on file  Alcohol Screen: Low Risk (05/06/2024)   Alcohol Screen    Last Alcohol Screening Score (AUDIT): 0  Housing: Unknown (05/06/2024)   Epic    Unable to Pay for Housing in the Last Year: Patient declined    Number of Times Moved in the Last Year: Not on file    Homeless in the Last Year: Not on file  Utilities: Patient Declined (05/06/2024)   Epic    Threatened with loss of utilities: Patient declined  Health Literacy: Not on file   Past Medical History:  Past Medical History:  Diagnosis Date   Anemia 10/02/2021   Non compliance w  medication regimen    Schizophrenia [facility])     Past Surgical History:  Procedure Laterality Date   BIOPSY  09/25/2021   Procedure: BIOPSY;  Surgeon: Teressa Toribio SQUIBB, MD;  Location: [facility];  Service: Gastroenterology;;   ESOPHAGOGASTRODUODENOSCOPY (EGD) WITH PROPOFOL  [name] 09/25/2021   Procedure: ESOPHAGOGASTRODUODENOSCOPY (EGD) WITH PROPOFOL ;  Surgeon: Teressa Toribio SQUIBB, MD;  Location: [facility];  Service: Gastroenterology;  Laterality: [name];  With NGT placement    Current Medications: Current Facility-Administered Medications  Medication Dose Route Frequency Provider Last Rate Last Admin   acetaminophen  (TYLENOL ) tablet 650 mg  650 mg Oral Q6H PRN Starkes-Perry, Takia S, FNP       alum &  mag hydroxide-simeth (MAALOX/MYLANTA) 200-200-20 MG/5ML suspension 30 mL  30 mL Oral Q4H PRN Starkes-Perry, Takia S, FNP       haloperidol  (HALDOL ) tablet 5 mg  5 mg Oral BID Abe Schools, MD   5 mg at 08/16/24 1014   Or   haloperidol  lactate (HALDOL ) injection 10 mg  10 mg Intramuscular BID Luma Clopper, MD       HYDROcodone -acetaminophen  (NORCO/VICODIN) 5-325 MG per tablet 1-2 tablet  1-2 tablet Oral Q4H PRN Starkes-Perry, Takia S, FNP       magnesium  hydroxide (MILK OF MAGNESIA) suspension 30 mL  30 mL Oral Daily PRN Starkes-Perry, Takia S, FNP       multivitamin with minerals tablet 1 tablet  1 tablet Oral Daily Wilkie Majel RAMAN, FNP   1 tablet at 08/16/24 1014   OLANZapine  (ZYPREXA ) injection 5 mg  5 mg Intramuscular TID PRN Wilkie Majel RAMAN, FNP       OLANZapine  (ZYPREXA ) tablet 15 mg  15 mg Oral BID Naraly Fritcher, MD   15 mg at 08/16/24 1014   OLANZapine  zydis (ZYPREXA ) disintegrating tablet 5 mg  5 mg Oral TID PRN Starkes-Perry, Takia S, FNP       ondansetron  (ZOFRAN ) tablet 4 mg  4 mg Oral Q6H PRN Starkes-Perry, Majel RAMAN, FNP       Or   ondansetron  (ZOFRAN ) injection 4 mg  4 mg Intravenous Q6H PRN Starkes-Perry, Majel RAMAN, FNP       thiamine  (VITAMIN B1) tablet 100 mg  100 mg Oral Daily Wilkie Majel RAMAN, FNP   100 mg at 08/16/24 1014   traZODone  (DESYREL ) tablet 50 mg  50 mg Oral QHS Starkes-Perry, Takia S, FNP   50 mg at 08/15/24 2112    Lab Results:  No results found for this or any previous visit (from the past 48 hours).   Blood Alcohol level:  Lab Results  Component Value Date   [facility] <15 05/02/2024   ETH <15 05/01/2024    Metabolic Disorder Labs: Lab Results  Component Value Date   HGBA1C 5.6 05/14/2024   MPG 114.02 05/14/2024   MPG 116.89 05/02/2024   Lab Results  Component Value Date   PROLACTIN 13.6 04/16/2015   Lab Results  Component Value Date   CHOL 219 (H) 05/14/2024   TRIG 84 05/14/2024   HDL 83 05/14/2024   CHOLHDL 2.6  05/14/2024   VLDL 17 05/14/2024   LDLCALC 119 (H) 05/14/2024   LDLCALC 130 (H) 05/01/2024    Physical Findings: AIMS:  , ,  ,  ,    CIWA:    COWS:      Psychiatric Specialty Exam:  Presentation  General Appearance:  Casual  Eye Contact: Fair  Speech: Normal rate  Speech Volume: Increased    Mood and Affect  Mood: Angry  Affect: Congruent   Thought Process  Thought Processes: Disorganized  Descriptions of Associations: Concrete Orientation:Partial  Thought Content: Discharge focused Hallucinations:denies Ideas of Reference:Paranoia; Delusions; Percusatory  Suicidal Thoughts:not endorsing Homicidal Thoughts:not endorsing  Sensorium  Memory: Recent Poor  Judgment: Poor  Insight: Poor   Executive Functions  Concentration: Poor  Attention Span: Poor  Recall: Poor  Fund of Knowledge: Poor  Language: Poor   Psychomotor Activity  Psychomotor Activity:No data recorded Musculoskeletal: Strength & Muscle Tone: within normal limits Gait & Station: normal Assets  Assets: Resilience    Physical Exam: Physical Exam Vitals and nursing note reviewed.    ROS Blood pressure 103/64, pulse 77, temperature 97.9 F (36.6 C), resp. rate 16, height 5' 4 (1.626 m), weight 44.9 kg, SpO2 98%. Body mass index is 16.99 kg/m.  Diagnosis: Active Problems:   Schizophrenia, paranoid (HCC) Paranoid  PLAN: Safety and Monitoring:  -- Involuntary admission to inpatient psychiatric unit for safety, stabilization and treatment  -- Daily contact with patient to assess and evaluate symptoms and progress in treatment  -- Patient's case to be discussed in multi-disciplinary team meeting  -- Observation Level : q15 minute checks  -- Vital signs:  q12 hours  -- Precautions: suicide, elopement, and assault -- Encouraged patient to participate in unit milieu and in scheduled group therapies   2. Psychiatric Diagnoses and Treatment:   Haldol  5 mg  BID Zyprexa  15 mg BID   LAI haldol  Dec 100mg  Q4 weeks-07/26/2024  . Discharge Planning:   -- Social work and case management to assist with discharge planning and identification of hospital follow-up needs prior to discharge  -- Estimated LOS: 3-4 days  Jomayra Novitsky, MD 08/16/2024, 11:30 AM "

## 2024-08-17 DIAGNOSIS — F200 Paranoid schizophrenia: Secondary | ICD-10-CM | POA: Diagnosis not present

## 2024-08-17 NOTE — Group Note (Signed)
 Date:  08/17/2024 Time:  9:44 AM  Group Topic/Focus:  Movement Therapy. Morning Stretch with Laramie Gelles.    Participation Level:  Did Not Attend    [name] Bias 08/17/2024, 9:44 AM

## 2024-08-17 NOTE — BHH Counselor (Signed)
 CSW received email from Etha Chang, APS caseworker, requesting updated FL2.   CSW sent FL2 via email per Nichole's request   [name] Croft, MSW, [facility] 08/17/2024 2:20 PM

## 2024-08-17 NOTE — Progress Notes (Signed)
" °   08/17/24 1200  Psych Admission Type (Psych Patients Only)  Admission Status Voluntary  Psychosocial Assessment  Patient Complaints None  Eye Contact Brief  Facial Expression Flat  Affect Flat  Speech Soft  Interaction Minimal  Motor Activity Restless  Appearance/Hygiene In scrubs  Behavior Characteristics Cooperative  Mood Pleasant  Thought Process  Coherency WDL  Content WDL  Delusions None reported or observed  Perception WDL  Hallucination None reported or observed  Judgment Poor  Confusion Mild  Danger to Self  Current suicidal ideation? Denies  Danger to Others  Danger to Others None reported or observed    "

## 2024-08-17 NOTE — BHH Counselor (Signed)
 CSW contacted pt's Ms.Singletary at [facility] to see if meeting with pt for placement can be rescheduled    CSW left HIPAA compliant VM requesting return call   [name] Croft, MSW, [facility] 08/17/2024 9:48 AM

## 2024-08-17 NOTE — Progress Notes (Signed)
 " [facility] MD Progress Note   3:43 PM [name]  MRN:  [id number]  [name] is a 64 y.o. female admitted: Medicallyfor 05/02/2024 10:56 AM for dehydration, failure to thrive and hypoglycemia. She carries the psychiatric diagnoses of Schizophrenia and has medical hx of hypoglycemia and failure to thrive and has a past medical history of sinus bradycardia, chronic anemia, severe malnutrition and recurrent hypoglycemia.. .    Subjective:  Chart reviewed, case discussed in multidisciplinary meeting, patient seen during rounds.   Patient is noted to be sitting in her bed.  She offers no complaints.  She remains minimally engaging in the interview.  She denies having any physical discomfort.  Per nursing patient is taking her medications with no reported side effects.  Patient is visible on the unit, coming out for meals and groups.  She continues to explore the questions related to her mental health especially regarding SI/HI  Past Psychiatric History: see h&P Family History: History reviewed. No pertinent family history. Social History:  Social History   Substance and Sexual Activity  Alcohol Use Not Currently   Comment: Refuses to disclose how much     Social History   Substance and Sexual Activity  Drug Use No   Comment: Refuses to answer    Social History   Socioeconomic History   Marital status: Single    Spouse name: Not on file   Number of children: Not on file   Years of education: Not on file   Highest education level: Not on file  Occupational History   Not on file  Tobacco Use   Smoking status: Every Day    Current packs/day: 2.00    Types: Cigarettes    Passive exposure: Current   Smokeless tobacco: Never  Vaping Use   Vaping status: Never Used  Substance and Sexual Activity   Alcohol use: Not Currently    Comment: Refuses to disclose how much   Drug use: No    Comment: Refuses to answer   Sexual activity: Not Currently    Comment: refused to answer  Other  Topics Concern   Not on file  Social History Narrative   Not on file   Social Drivers of Health   Tobacco Use: High Risk (05/06/2024)   Patient History    Smoking Tobacco Use: Every Day    Smokeless Tobacco Use: Never    Passive Exposure: Current  Financial Resource Strain: Not on file  Food Insecurity: Patient Declined (05/06/2024)   Epic    Worried About Programme Researcher, Broadcasting/film/video in the Last Year: Patient declined    Barista in the Last Year: Patient declined  Transportation Needs: Patient Declined (05/06/2024)   Epic    Lack of Transportation (Medical): Patient declined    Lack of Transportation (Non-Medical): Patient declined  Physical Activity: Not on file  Stress: Not on file  Social Connections: Not on file  Depression (PHQ2-9): Not on file  Alcohol Screen: Low Risk (05/06/2024)   Alcohol Screen    Last Alcohol Screening Score (AUDIT): 0  Housing: Unknown (05/06/2024)   Epic    Unable to Pay for Housing in the Last Year: Patient declined    Number of Times Moved in the Last Year: Not on file    Homeless in the Last Year: Not on file  Utilities: Patient Declined (05/06/2024)   Epic    Threatened with loss of utilities: Patient declined  Health Literacy: Not on file   Past Medical  History:  Past Medical History:  Diagnosis Date   Anemia 10/02/2021   Non compliance w medication regimen    Schizophrenia [facility])     Past Surgical History:  Procedure Laterality Date   BIOPSY  09/25/2021   Procedure: BIOPSY;  Surgeon: Teressa Toribio SQUIBB, MD;  Location: [facility];  Service: Gastroenterology;;   ESOPHAGOGASTRODUODENOSCOPY (EGD) WITH PROPOFOL  [name] 09/25/2021   Procedure: ESOPHAGOGASTRODUODENOSCOPY (EGD) WITH PROPOFOL ;  Surgeon: Teressa Toribio SQUIBB, MD;  Location: [facility];  Service: Gastroenterology;  Laterality: [name];  With NGT placement    Current Medications: Current Facility-Administered Medications  Medication Dose Route Frequency Provider Last Rate Last Admin    acetaminophen  (TYLENOL ) tablet 650 mg  650 mg Oral Q6H PRN Starkes-Perry, Takia S, FNP       alum & mag hydroxide-simeth (MAALOX/MYLANTA) 200-200-20 MG/5ML suspension 30 mL  30 mL Oral Q4H PRN Starkes-Perry, Takia S, FNP       haloperidol  (HALDOL ) tablet 5 mg  5 mg Oral BID Taylee Gunnells, MD   5 mg at 08/17/24 0935   HYDROcodone -acetaminophen  (NORCO/VICODIN) 5-325 MG per tablet 1-2 tablet  1-2 tablet Oral Q4H PRN Starkes-Perry, Takia S, FNP       magnesium  hydroxide (MILK OF MAGNESIA) suspension 30 mL  30 mL Oral Daily PRN Starkes-Perry, Takia S, FNP       multivitamin with minerals tablet 1 tablet  1 tablet Oral Daily Wilkie Majel RAMAN, FNP   1 tablet at 08/17/24 0935   OLANZapine  (ZYPREXA ) injection 5 mg  5 mg Intramuscular TID PRN Wilkie Majel RAMAN, FNP       OLANZapine  (ZYPREXA ) tablet 15 mg  15 mg Oral BID Donisha Hoch, MD   15 mg at 08/17/24 9064   OLANZapine  zydis (ZYPREXA ) disintegrating tablet 5 mg  5 mg Oral TID PRN Starkes-Perry, Takia S, FNP       ondansetron  (ZOFRAN ) tablet 4 mg  4 mg Oral Q6H PRN Starkes-Perry, Majel RAMAN, FNP       Or   ondansetron  (ZOFRAN ) injection 4 mg  4 mg Intravenous Q6H PRN Starkes-Perry, Majel RAMAN, FNP       thiamine  (VITAMIN B1) tablet 100 mg  100 mg Oral Daily Wilkie Majel RAMAN, FNP   100 mg at 08/17/24 0936   traZODone  (DESYREL ) tablet 50 mg  50 mg Oral QHS Starkes-Perry, Takia S, FNP   50 mg at 08/16/24 2114    Lab Results:  No results found for this or any previous visit (from the past 48 hours).   Blood Alcohol level:  Lab Results  Component Value Date   [facility] <15 05/02/2024   ETH <15 05/01/2024    Metabolic Disorder Labs: Lab Results  Component Value Date   HGBA1C 5.6 05/14/2024   MPG 114.02 05/14/2024   MPG 116.89 05/02/2024   Lab Results  Component Value Date   PROLACTIN 13.6 04/16/2015   Lab Results  Component Value Date   CHOL 219 (H) 05/14/2024   TRIG 84 05/14/2024   HDL 83 05/14/2024   CHOLHDL 2.6 05/14/2024    VLDL 17 05/14/2024   LDLCALC 119 (H) 05/14/2024   LDLCALC 130 (H) 05/01/2024    Physical Findings: AIMS:  , ,  ,  ,    CIWA:    COWS:      Psychiatric Specialty Exam:  Presentation  General Appearance:  Casual  Eye Contact: Fair  Speech: Normal rate  Speech Volume: Increased    Mood and Affect  Mood: Angry  Affect: Congruent  Thought Process  Thought Processes: Disorganized  Descriptions of Associations: Concrete Orientation:Partial  Thought Content: Discharge focused Hallucinations:denies Ideas of Reference:Paranoia; Delusions; Percusatory  Suicidal Thoughts:not endorsing Homicidal Thoughts:not endorsing  Sensorium  Memory: Recent Poor  Judgment: Poor  Insight: Poor   Executive Functions  Concentration: Poor  Attention Span: Poor  Recall: Poor  Fund of Knowledge: Poor  Language: Poor   Psychomotor Activity  Psychomotor Activity:No data recorded Musculoskeletal: Strength & Muscle Tone: within normal limits Gait & Station: normal Assets  Assets: Resilience    Physical Exam: Physical Exam Vitals and nursing note reviewed.    ROS Blood pressure (!) 105/56, pulse 64, temperature (!) 97.2 F (36.2 C), resp. rate 14, height 5' 4 (1.626 m), weight 44.9 kg, SpO2 100%. Body mass index is 16.99 kg/m.  Diagnosis: Active Problems:   Schizophrenia, paranoid (HCC) Paranoid  PLAN: Safety and Monitoring:  -- Involuntary admission to inpatient psychiatric unit for safety, stabilization and treatment  -- Daily contact with patient to assess and evaluate symptoms and progress in treatment  -- Patient's case to be discussed in multi-disciplinary team meeting  -- Observation Level : q15 minute checks  -- Vital signs:  q12 hours  -- Precautions: suicide, elopement, and assault -- Encouraged patient to participate in unit milieu and in scheduled group therapies   2. Psychiatric Diagnoses and Treatment:   Haldol  5 mg  BID Zyprexa  15 mg BID   LAI haldol  Dec 100mg  Q4 weeks-07/26/2024  . Discharge Planning:   -- Social work and case management to assist with discharge planning and identification of hospital follow-up needs prior to discharge  -- Estimated LOS: 3-4 days  Locke Barrell, MD 08/17/2024, 3:43 PM "

## 2024-08-17 NOTE — Group Note (Signed)
 Recreation Therapy Group Note   Group Topic:Stress Management  Group Date: 08/17/2024 Start Time: 1410 End Time: 1440 Facilitators: Celestia Jeoffrey BRAVO, LRT, CTRS Location: Dayroom  Group Description: PMR (Progressive Muscle Relaxation). LRT educates patients on what PMR is and the benefits that come from it. Patients are asked to sit with their feet flat on the floor while sitting up and all the way back in their chair, if possible. LRT and pts follow a prompt through a speaker that requires you to tense and release different muscles in their body and focus on their breathing. During session, lights are off and soft music is being played. Pts are given a stress ball to use if needed.   Goal Area(s) Addressed:  Patients will be able to describe progressive muscle relaxation.  Patient will practice using relaxation technique. Patient will identify a new coping skill.  Patient will follow multistep directions to reduce anxiety and stress.   Affect/Mood: [name]   Participation Level: Did not attend    Clinical Observations/Individualized Feedback: Patient did not attend.  Plan: Continue to engage patient in RT group sessions 2-3x/week.   Jeoffrey BRAVO Celestia, LRT, CTRS 08/17/2024 4:18 PM

## 2024-08-17 NOTE — Progress Notes (Signed)
" °   08/16/24 2319  Psych Admission Type (Psych Patients Only)  Admission Status Voluntary  Psychosocial Assessment  Patient Complaints None  Eye Contact Brief  Facial Expression Flat  Affect Flat  Speech Soft  Interaction Minimal  Motor Activity Restless  Appearance/Hygiene In scrubs  Behavior Characteristics Cooperative  Mood Pleasant  Thought Process  Coherency WDL  Content WDL  Delusions None reported or observed  Perception WDL  Hallucination None reported or observed  Judgment Poor  Confusion Mild  Danger to Self  Current suicidal ideation? Denies  Danger to Others  Danger to Others None reported or observed    Estimated Sleeping Duration (Last 24 Hours): 7.25-10.00 hours  "

## 2024-08-17 NOTE — Plan of Care (Signed)
  Problem: Education: Goal: Emotional status will improve Outcome: Progressing   Problem: Activity: Goal: Interest or engagement in activities will improve Outcome: Progressing   Problem: Coping: Goal: Ability to verbalize frustrations and anger appropriately will improve Outcome: Progressing

## 2024-08-17 NOTE — [place] FL2 (Addendum)
 " [place]  MEDICAID FL2 LEVEL OF CARE FORM     IDENTIFICATION  Patient Name: [name] Birthdate: May 10, 1961 Sex: female Admission Date (Current Location): 05/06/2024  [facility] and [place] Number:  Chiropodist and Address:  [place], [address] [place], [place] [address]      Provider Number: [id number]  Attending Physician Name and Address:  Donnelly Mellow, MD  Relative Name and Phone Number:  [name] [facility] APS- [phone], [phone]    Current Level of Care: Hospital Recommended Level of Care: Skilled Nursing Facility Prior Approval Number:    Date Approved/Denied:   PASRR Number:    Discharge Plan: SNF    Current Diagnoses: Patient Active Problem List   Diagnosis Date Noted   Psychosis (HCC) 01/20/2023   Schizoaffective disorder, depressive type (HCC) 04/08/2022   Ventricular tachycardia (HCC) 10/13/2021   Sinus bradycardia 10/03/2021   Anemia of chronic disease 10/02/2021   Hypomagnesemia 09/22/2021   Hypokalemia 09/22/2021   Hypophosphatemia 09/21/2021   Hyponatremia 09/20/2021   Metabolic acidosis 09/16/2021   Protein-calorie malnutrition, severe 03/27/2020   Hypotension 03/26/2020   Anorexia 03/26/2020   Hypokalemia, hypomagnesemia and hypophosphatemia 03/26/2020   Hypocalcemia 03/26/2020   Fall at home, initial encounter 03/26/2020   Recurrent hypoglycemia and severe malnutrition 03/14/2020   Paranoid schizophrenia (HCC) 03/01/2020   Noncompliance with diet and medication regimen    Psychoses (HCC)    Tobacco abuse 03/12/2014   Hypokalemic alkalosis 11/11/2011   Medically noncompliant 11/11/2011   Schizophrenia, paranoid (HCC) 11/07/2011    Orientation RESPIRATION BLADDER Height & Weight     Place, Self, Time  Normal Continent Weight: 99 lb (44.9 kg) Height:  5' 4 (162.6 cm)  BEHAVIORAL SYMPTOMS/MOOD NEUROLOGICAL BOWEL NUTRITION STATUS      Continent  (None)  AMBULATORY STATUS  COMMUNICATION OF NEEDS Skin   Independent Verbally Normal                       Personal Care Assistance Level of Assistance              Functional Limitations Info             SPECIAL CARE FACTORS FREQUENCY  Blood pressure Blood Pressure Frequency: 2X daily in the hospital                  Contractures Contractures Info: Not present    Additional Factors Info  Code Status, Allergies Code Status Info: FULL Allergies Info: Aspirin, Ziprasidone Hcl Psychotropic Info: haloperidol  (HALDOL ) tablet 5 mg, OLANZapine  (ZYPREXA ) tablet 15 mg, LAI Haldol  Deconate 100mg  Q4 weeks         Current Medications (08/17/2024):  This is the current hospital active medication list Current Facility-Administered Medications  Medication Dose Route Frequency Provider Last Rate Last Admin   acetaminophen  (TYLENOL ) tablet 650 mg  650 mg Oral Q6H PRN Starkes-Perry, Takia S, FNP       alum & mag hydroxide-simeth (MAALOX/MYLANTA) 200-200-20 MG/5ML suspension 30 mL  30 mL Oral Q4H PRN Starkes-Perry, Takia S, FNP       haloperidol  (HALDOL ) tablet 5 mg  5 mg Oral BID Jadapalle, Sree, MD   5 mg at 08/17/24 0935   HYDROcodone -acetaminophen  (NORCO/VICODIN) 5-325 MG per tablet 1-2 tablet  1-2 tablet Oral Q4H PRN Starkes-Perry, Takia S, FNP       magnesium  hydroxide (MILK OF MAGNESIA) suspension 30 mL  30 mL Oral Daily PRN Starkes-Perry, Majel RAMAN,  FNP       multivitamin with minerals tablet 1 tablet  1 tablet Oral Daily Starkes-Perry, Takia S, FNP   1 tablet at 08/17/24 0935   OLANZapine  (ZYPREXA ) injection 5 mg  5 mg Intramuscular TID PRN Starkes-Perry, Takia S, FNP       OLANZapine  (ZYPREXA ) tablet 15 mg  15 mg Oral BID Jadapalle, Sree, MD   15 mg at 08/17/24 0935   OLANZapine  zydis (ZYPREXA ) disintegrating tablet 5 mg  5 mg Oral TID PRN Starkes-Perry, Takia S, FNP       ondansetron  (ZOFRAN ) tablet 4 mg  4 mg Oral Q6H PRN Wilkie Majel RAMAN, FNP       Or   ondansetron  (ZOFRAN ) injection 4 mg   4 mg Intravenous Q6H PRN Starkes-Perry, Takia S, FNP       thiamine  (VITAMIN B1) tablet 100 mg  100 mg Oral Daily Wilkie Majel RAMAN, FNP   100 mg at 08/17/24 9063   traZODone  (DESYREL ) tablet 50 mg  50 mg Oral QHS Starkes-Perry, Takia S, FNP   50 mg at 08/16/24 2114     Discharge Medications: Please see discharge summary for a list of discharge medications.  Relevant Imaging Results:  Relevant Lab Results:   Additional Information SSN: [id number]  [name], [place]     "

## 2024-08-17 NOTE — Group Note (Signed)
 Date:  08/17/2024 Time:  8:34 PM  Group Topic/Focus:  Wrap-Up Group:   The focus of this group is to help patients review their daily goal of treatment and discuss progress on daily workbooks.    Participation Level:  Did Not Attend  Beatris ONEIDA Hasten 08/17/2024, 8:34 PM

## 2024-08-18 DIAGNOSIS — F200 Paranoid schizophrenia: Secondary | ICD-10-CM | POA: Diagnosis not present

## 2024-08-18 NOTE — Group Note (Signed)
 Physical/Occupational Therapy Group Note  Group Topic: Neurographic Art  Group Date: 08/18/2024 Start Time: 1315 End Time: 1400 Facilitators: Clive Warren CROME, OT   Group Description: Group participated with Neurographic art activity, using watercolor paints to facilitate creative expression and meditation/relaxation for each individual.  Incorporated bimanual coordination, mental focus, emotional processing, task/command following and relaxation techniques as appropriate.  Patients engaged socially with therapist and other group participants throughout session. Allowed to ask questions as appropriate, and encouraged to identify ways they could use/share their creations with themselves and others.  Therapeutic Goal(s):  Demonstrate ability to independently manipulate utensils required to participate with and complete activity. Demonstrate ability to cognitively focus on task and follow commands necessary for completion. Demonstrate use of art as an outlet for emotional processing and expression. Identify and demonstrate importance of relaxation, neural calming and meditation for improved participation with life groups.  Individual Participation: Pt did not attend.  Participation Level: Did not attend   Participation Quality:   Behavior:   Speech/Thought Process:   Affect/Mood:   Insight:   Judgement:   Modes of Intervention:   Patient Response to Interventions:    Plan: Continue to engage patient in PT/OT groups 1 - 2x/week.   Lannie Yusuf R., MPH, MS, OTR/L ascom [phone] 08/18/24, 4:16 PM

## 2024-08-18 NOTE — Plan of Care (Signed)
   Problem: Education: Goal: Knowledge of [facility] Education information/materials will improve Outcome: Not Progressing Goal: Emotional status will improve Outcome: Not Progressing Goal: Mental status will improve Outcome: Not Progressing Goal: Verbalization of understanding the information provided will improve Outcome: Not Progressing

## 2024-08-18 NOTE — Group Note (Signed)
 Date:  08/18/2024 Time:  9:59 AM  Group Topic/Focus:  Recovery Goals:   The focus of this group is to identify appropriate goals for recovery and establish a plan to achieve them.    Participation Level:  Did Not Attend   [name],Paula Madrigal E 08/18/2024, 9:59 AM

## 2024-08-18 NOTE — Progress Notes (Signed)
 " [facility] MD Progress Note   11:57 AM [name]  MRN:  [id number]  [name] is a 64 y.o. female admitted: Medicallyfor 05/02/2024 10:56 AM for dehydration, failure to thrive and hypoglycemia. She carries the psychiatric diagnoses of Schizophrenia and has medical hx of hypoglycemia and failure to thrive and has a past medical history of sinus bradycardia, chronic anemia, severe malnutrition and recurrent hypoglycemia.. .    Subjective:  Chart reviewed, case discussed in multidisciplinary meeting, patient seen during rounds.   Patient is noted to be sitting in bed.  She offers no complaints.  Per nursing patient is taking her medications with no reported side effects patient has fair appetite and sleep.  Provider tried to engage her in discharge planning and guardianship information the patient became very hostile and asked the provider to leave her room  Past Psychiatric History: see h&P Family History: History reviewed. No pertinent family history. Social History:  Social History   Substance and Sexual Activity  Alcohol Use Not Currently   Comment: Refuses to disclose how much     Social History   Substance and Sexual Activity  Drug Use No   Comment: Refuses to answer    Social History   Socioeconomic History   Marital status: Single    Spouse name: Not on file   Number of children: Not on file   Years of education: Not on file   Highest education level: Not on file  Occupational History   Not on file  Tobacco Use   Smoking status: Every Day    Current packs/day: 2.00    Types: Cigarettes    Passive exposure: Current   Smokeless tobacco: Never  Vaping Use   Vaping status: Never Used  Substance and Sexual Activity   Alcohol use: Not Currently    Comment: Refuses to disclose how much   Drug use: No    Comment: Refuses to answer   Sexual activity: Not Currently    Comment: refused to answer  Other Topics Concern   Not on file  Social History Narrative   Not on file    Social Drivers of Health   Tobacco Use: High Risk (05/06/2024)   Patient History    Smoking Tobacco Use: Every Day    Smokeless Tobacco Use: Never    Passive Exposure: Current  Financial Resource Strain: Not on file  Food Insecurity: Patient Declined (05/06/2024)   Epic    Worried About Programme Researcher, Broadcasting/film/video in the Last Year: Patient declined    Barista in the Last Year: Patient declined  Transportation Needs: Patient Declined (05/06/2024)   Epic    Lack of Transportation (Medical): Patient declined    Lack of Transportation (Non-Medical): Patient declined  Physical Activity: Not on file  Stress: Not on file  Social Connections: Not on file  Depression (PHQ2-9): Not on file  Alcohol Screen: Low Risk (05/06/2024)   Alcohol Screen    Last Alcohol Screening Score (AUDIT): 0  Housing: Unknown (05/06/2024)   Epic    Unable to Pay for Housing in the Last Year: Patient declined    Number of Times Moved in the Last Year: Not on file    Homeless in the Last Year: Not on file  Utilities: Patient Declined (05/06/2024)   Epic    Threatened with loss of utilities: Patient declined  Health Literacy: Not on file   Past Medical History:  Past Medical History:  Diagnosis Date   Anemia 10/02/2021  Non compliance w medication regimen    Schizophrenia [facility])     Past Surgical History:  Procedure Laterality Date   BIOPSY  09/25/2021   Procedure: BIOPSY;  Surgeon: Teressa Toribio SQUIBB, MD;  Location: [facility];  Service: Gastroenterology;;   ESOPHAGOGASTRODUODENOSCOPY (EGD) WITH PROPOFOL  [name] 09/25/2021   Procedure: ESOPHAGOGASTRODUODENOSCOPY (EGD) WITH PROPOFOL ;  Surgeon: Teressa Toribio SQUIBB, MD;  Location: [facility];  Service: Gastroenterology;  Laterality: [name];  With NGT placement    Current Medications: Current Facility-Administered Medications  Medication Dose Route Frequency Provider Last Rate Last Admin   acetaminophen  (TYLENOL ) tablet 650 mg  650 mg Oral Q6H PRN Starkes-Perry,  Takia S, FNP       alum & mag hydroxide-simeth (MAALOX/MYLANTA) 200-200-20 MG/5ML suspension 30 mL  30 mL Oral Q4H PRN Starkes-Perry, Takia S, FNP       haloperidol  (HALDOL ) tablet 5 mg  5 mg Oral BID [name], MD   5 mg at 08/18/24 0944   HYDROcodone -acetaminophen  (NORCO/VICODIN) 5-325 MG per tablet 1-2 tablet  1-2 tablet Oral Q4H PRN Starkes-Perry, Takia S, FNP       magnesium  hydroxide (MILK OF MAGNESIA) suspension 30 mL  30 mL Oral Daily PRN Starkes-Perry, Takia S, FNP       multivitamin with minerals tablet 1 tablet  1 tablet Oral Daily Wilkie Majel RAMAN, FNP   1 tablet at 08/18/24 9056   OLANZapine  (ZYPREXA ) injection 5 mg  5 mg Intramuscular TID PRN Wilkie Majel RAMAN, FNP       OLANZapine  (ZYPREXA ) tablet 15 mg  15 mg Oral BID Destinee Taber, MD   15 mg at 08/18/24 0944   OLANZapine  zydis (ZYPREXA ) disintegrating tablet 5 mg  5 mg Oral TID PRN Starkes-Perry, Takia S, FNP       ondansetron  (ZOFRAN ) tablet 4 mg  4 mg Oral Q6H PRN Starkes-Perry, Majel RAMAN, FNP       Or   ondansetron  (ZOFRAN ) injection 4 mg  4 mg Intravenous Q6H PRN Starkes-Perry, Majel RAMAN, FNP       thiamine  (VITAMIN B1) tablet 100 mg  100 mg Oral Daily Starkes-Perry, Takia S, FNP   100 mg at 08/18/24 9056   traZODone  (DESYREL ) tablet 50 mg  50 mg Oral QHS Starkes-Perry, Takia S, FNP   50 mg at 08/17/24 2117    Lab Results:  No results found for this or any previous visit (from the past 48 hours).   Blood Alcohol level:  Lab Results  Component Value Date   [facility] <15 05/02/2024   ETH <15 05/01/2024    Metabolic Disorder Labs: Lab Results  Component Value Date   HGBA1C 5.6 05/14/2024   MPG 114.02 05/14/2024   MPG 116.89 05/02/2024   Lab Results  Component Value Date   PROLACTIN 13.6 04/16/2015   Lab Results  Component Value Date   CHOL 219 (H) 05/14/2024   TRIG 84 05/14/2024   HDL 83 05/14/2024   CHOLHDL 2.6 05/14/2024   VLDL 17 05/14/2024   LDLCALC 119 (H) 05/14/2024   LDLCALC 130 (H)  05/01/2024    Physical Findings: AIMS:  , ,  ,  ,    CIWA:    COWS:      Psychiatric Specialty Exam:  Presentation  General Appearance:  Casual  Eye Contact: Fair  Speech: Normal rate  Speech Volume: Increased    Mood and Affect  Mood: Angry  Affect: Congruent   Thought Process  Thought Processes: Disorganized  Descriptions of Associations: Concrete Orientation:Partial  Thought Content: Discharge focused Hallucinations:denies Ideas of Reference:Paranoia; Delusions; Percusatory  Suicidal Thoughts:not endorsing Homicidal Thoughts:not endorsing  Sensorium  Memory: Recent Poor  Judgment: Poor  Insight: Poor   Executive Functions  Concentration: Poor  Attention Span: Poor  Recall: Poor  Fund of Knowledge: Poor  Language: Poor   Psychomotor Activity  Psychomotor Activity:No data recorded Musculoskeletal: Strength & Muscle Tone: within normal limits Gait & Station: normal Assets  Assets: Resilience    Physical Exam: Physical Exam Vitals and nursing note reviewed.    ROS Blood pressure 100/83, pulse 72, temperature 98.1 F (36.7 C), resp. rate 17, height 5' 4 (1.626 m), weight 44.9 kg, SpO2 100%. Body mass index is 16.99 kg/m.  Diagnosis: Active Problems:   Schizophrenia, paranoid (HCC) Paranoid  PLAN: Safety and Monitoring:  -- Involuntary admission to inpatient psychiatric unit for safety, stabilization and treatment  -- Daily contact with patient to assess and evaluate symptoms and progress in treatment  -- Patient's case to be discussed in multi-disciplinary team meeting  -- Observation Level : q15 minute checks  -- Vital signs:  q12 hours  -- Precautions: suicide, elopement, and assault -- Encouraged patient to participate in unit milieu and in scheduled group therapies   2. Psychiatric Diagnoses and Treatment:   Haldol  5 mg BID Zyprexa  15 mg BID   LAI haldol  Dec 100mg  Q4 weeks-07/26/2024  . Discharge  Planning:   -- Social work and case management to assist with discharge planning and identification of hospital follow-up needs prior to discharge  -- Estimated LOS: 3-4 days  Hanz Winterhalter, MD 08/18/2024, 11:57 AM "

## 2024-08-18 NOTE — Progress Notes (Addendum)
(  Sleep Hours) - 10.25 (Any PRNs that were needed, meds refused, or side effects to meds)- None needed or requested (Any disturbances and when (visitation, over night)-None reported or observed (Concerns raised by the patient)- None reported or observed (SI/HI/AVH)- Denies   08/17/24 2117  Psych Admission Type (Psych Patients Only)  Admission Status Voluntary  Psychosocial Assessment  Patient Complaints None  Eye Contact Brief  Facial Expression Flat  Affect Flat  Speech Soft  Interaction Minimal  Motor Activity Restless  Appearance/Hygiene In scrubs  Behavior Characteristics Cooperative  Mood Pleasant  Aggressive Behavior  Effect No apparent injury  Thought Process  Coherency WDL  Content WDL  Delusions None reported or observed  Perception WDL  Hallucination None reported or observed  Judgment Poor  Confusion Mild  Danger to Self  Current suicidal ideation? Denies  Danger to Others  Danger to Others None reported or observed

## 2024-08-18 NOTE — Plan of Care (Signed)
  Problem: Education: Goal: Mental status will improve Outcome: Progressing   Problem: Health Behavior/Discharge Planning: Goal: Identification of resources available to assist in meeting health care needs will improve Outcome: Progressing Goal: Compliance with treatment plan for underlying cause of condition will improve Outcome: Progressing   Problem: Safety: Goal: Periods of time without injury will increase Outcome: Progressing

## 2024-08-18 NOTE — Group Note (Signed)
 Recreation Therapy Group Note   Group Topic:Leisure Education  Group Date: 08/18/2024 Start Time: 1405 End Time: 1455 Facilitators: Celestia Jeoffrey BRAVO, LRT Location: Dayroom  Group Description: Leisure. Patients were given the option to choose from journaling, coloring, drawing, making origami, playing with playdoh, listening to music or singing karaoke. LRT and pts discussed the meaning of leisure, the importance of participating in leisure during their free time/when they're outside of the hospital, as well as how our leisure interests can also serve as coping skills.   Goal Area(s) Addressed:  Patient will identify a current leisure interest.  Patient will learn the definition of leisure. Patient will practice making a positive decision. Patient will have the opportunity to try a new leisure activity. Patient will communicate with peers and LRT.    Affect/Mood: [name]   Participation Level: Did not attend    Clinical Observations/Individualized Feedback: Patient did not attend.  Plan: Continue to engage patient in RT group sessions 2-3x/week.   Jeoffrey BRAVO Celestia, LRT, CTRS 08/18/2024 4:27 PM

## 2024-08-19 NOTE — Progress Notes (Signed)
 Behavior:  Cooperative.  Isolates to room.  Reports daily BM.   Psych assessment: Denies SI/HI and AVH.   Group attendance:  0/2  Medication/ PRNs: Compliant.  Pain: Denies  15 min checks in place for safety.

## 2024-08-19 NOTE — Group Note (Signed)
 Date:  08/19/2024 Time:  5:40 AM  Group Topic/Focus:  Wrap-Up Group:   The focus of this group is to help patients review their daily goal of treatment and discuss progress on daily workbooks.    Participation Level:  Did Not Attend  Participation Quality:     Affect:     Cognitive:     Insight: None  Engagement in Group:     Modes of Intervention:     Additional Comments:    [name] C [name] 08/19/2024, 5:40 AM

## 2024-08-19 NOTE — Plan of Care (Signed)
  Problem: Coping: Goal: Ability to demonstrate self-control will improve Outcome: Progressing   Problem: Health Behavior/Discharge Planning: Goal: Compliance with treatment plan for underlying cause of condition will improve Outcome: Progressing   Problem: Activity: Goal: Interest or engagement in activities will improve Outcome: Not Progressing

## 2024-08-19 NOTE — Group Note (Signed)
 Date:  08/19/2024 Time:  11:53 AM  Group Topic/Focus:  Goals Group:   The focus of this group is to help patients establish daily goals to achieve during treatment and discuss how the patient can incorporate goal setting into their daily lives to aide in recovery.    Participation Level:  Did Not Attend   [name] 08/19/2024, 11:53 AM

## 2024-08-19 NOTE — Group Note (Signed)
 Date:  08/19/2024 Time:  2:01 PM  Group Topic/Focus:  Movement Therapy    Participation Level:  Did Not Attend    [name] Bias 08/19/2024, 2:01 PM

## 2024-08-19 NOTE — Plan of Care (Signed)

## 2024-08-19 NOTE — Group Note (Signed)
 Date:  08/19/2024 Time:  8:44 PM  Group Topic/Focus:  Managing Feelings:   The focus of this group is to identify what feelings patients have difficulty handling and develop a plan to handle them in a healthier way upon discharge.    Participation Level:  Did Not Attend  Participation Quality:     Affect:     Cognitive:     Insight: None  Engagement in Group:     Modes of Intervention:     Additional Comments:     Alayja Armas K 08/19/2024, 8:44 PM

## 2024-08-19 NOTE — Progress Notes (Signed)
 Pt denies SI/HI/AVH. She voiced no complaints this shift.   08/18/24 2109  Psych Admission Type (Psych Patients Only)  Admission Status Involuntary  Psychosocial Assessment  Patient Complaints None  Eye Contact Brief  Facial Expression Flat  Affect Flat  Speech Logical/coherent  Interaction Minimal;Isolative  Motor Activity Slow  Appearance/Hygiene In scrubs  Behavior Characteristics Guarded  Mood Sullen  Thought Process  Coherency WDL  Content WDL  Delusions None reported or observed  Perception WDL  Hallucination None reported or observed  Judgment Impaired  Confusion None  Danger to Self  Current suicidal ideation? Denies  Danger to Others  Danger to Others None reported or observed

## 2024-08-20 DIAGNOSIS — F200 Paranoid schizophrenia: Secondary | ICD-10-CM | POA: Diagnosis not present

## 2024-08-20 NOTE — Progress Notes (Signed)
 " [facility] MD Progress Note   1:42 PM [name]  MRN:  [id number]  [name] is a 64 y.o. female admitted: Medicallyfor 05/02/2024 10:56 AM for dehydration, failure to thrive and hypoglycemia. She carries the psychiatric diagnoses of Schizophrenia and has medical hx of hypoglycemia and failure to thrive and has a past medical history of sinus bradycardia, chronic anemia, severe malnutrition and recurrent hypoglycemia.. .    Subjective:  Chart reviewed, case discussed in multidisciplinary meeting, patient seen during rounds.    Patient is noted to be walking in her room.  She offers no complaints.  She reports fair appetite and sleep.  She denies having any physical complaints.  She continues to not engage in questions related to mental health and remains discharge focused patient has been psychiatrically cleared and waiting for placement at this time   Past Psychiatric History: see h&P Family History: History reviewed. No pertinent family history. Social History:  Social History   Substance and Sexual Activity  Alcohol Use Not Currently   Comment: Refuses to disclose how much     Social History   Substance and Sexual Activity  Drug Use No   Comment: Refuses to answer    Social History   Socioeconomic History   Marital status: Single    Spouse name: Not on file   Number of children: Not on file   Years of education: Not on file   Highest education level: Not on file  Occupational History   Not on file  Tobacco Use   Smoking status: Every Day    Current packs/day: 2.00    Types: Cigarettes    Passive exposure: Current   Smokeless tobacco: Never  Vaping Use   Vaping status: Never Used  Substance and Sexual Activity   Alcohol use: Not Currently    Comment: Refuses to disclose how much   Drug use: No    Comment: Refuses to answer   Sexual activity: Not Currently    Comment: refused to answer  Other Topics Concern   Not on file  Social History Narrative   Not on file    Social Drivers of Health   Tobacco Use: High Risk (05/06/2024)   Patient History    Smoking Tobacco Use: Every Day    Smokeless Tobacco Use: Never    Passive Exposure: Current  Financial Resource Strain: Not on file  Food Insecurity: Patient Declined (05/06/2024)   Epic    Worried About Programme Researcher, Broadcasting/film/video in the Last Year: Patient declined    Barista in the Last Year: Patient declined  Transportation Needs: Patient Declined (05/06/2024)   Epic    Lack of Transportation (Medical): Patient declined    Lack of Transportation (Non-Medical): Patient declined  Physical Activity: Not on file  Stress: Not on file  Social Connections: Not on file  Depression (PHQ2-9): Not on file  Alcohol Screen: Low Risk (05/06/2024)   Alcohol Screen    Last Alcohol Screening Score (AUDIT): 0  Housing: Unknown (05/06/2024)   Epic    Unable to Pay for Housing in the Last Year: Patient declined    Number of Times Moved in the Last Year: Not on file    Homeless in the Last Year: Not on file  Utilities: Patient Declined (05/06/2024)   Epic    Threatened with loss of utilities: Patient declined  Health Literacy: Not on file   Past Medical History:  Past Medical History:  Diagnosis Date   Anemia 10/02/2021  Non compliance w medication regimen    Schizophrenia [facility])     Past Surgical History:  Procedure Laterality Date   BIOPSY  09/25/2021   Procedure: BIOPSY;  Surgeon: Teressa Toribio SQUIBB, MD;  Location: [facility];  Service: Gastroenterology;;   ESOPHAGOGASTRODUODENOSCOPY (EGD) WITH PROPOFOL  [name] 09/25/2021   Procedure: ESOPHAGOGASTRODUODENOSCOPY (EGD) WITH PROPOFOL ;  Surgeon: Teressa Toribio SQUIBB, MD;  Location: [facility];  Service: Gastroenterology;  Laterality: [name];  With NGT placement    Current Medications: Current Facility-Administered Medications  Medication Dose Route Frequency Provider Last Rate Last Admin   acetaminophen  (TYLENOL ) tablet 650 mg  650 mg Oral Q6H PRN Starkes-Perry,  Takia S, FNP       alum & mag hydroxide-simeth (MAALOX/MYLANTA) 200-200-20 MG/5ML suspension 30 mL  30 mL Oral Q4H PRN Starkes-Perry, Takia S, FNP       haloperidol  (HALDOL ) tablet 5 mg  5 mg Oral BID Kateline Kinkade, MD   5 mg at 08/20/24 9066   HYDROcodone -acetaminophen  (NORCO/VICODIN) 5-325 MG per tablet 1-2 tablet  1-2 tablet Oral Q4H PRN Starkes-Perry, Takia S, FNP       magnesium  hydroxide (MILK OF MAGNESIA) suspension 30 mL  30 mL Oral Daily PRN Starkes-Perry, Takia S, FNP       multivitamin with minerals tablet 1 tablet  1 tablet Oral Daily Wilkie Majel RAMAN, FNP   1 tablet at 08/20/24 9066   OLANZapine  (ZYPREXA ) injection 5 mg  5 mg Intramuscular TID PRN Wilkie Majel RAMAN, FNP       OLANZapine  (ZYPREXA ) tablet 15 mg  15 mg Oral BID Leticia Coletta, MD   15 mg at 08/20/24 0932   OLANZapine  zydis (ZYPREXA ) disintegrating tablet 5 mg  5 mg Oral TID PRN Wilkie Majel RAMAN, FNP       ondansetron  (ZOFRAN ) tablet 4 mg  4 mg Oral Q6H PRN Starkes-Perry, Majel RAMAN, FNP       Or   ondansetron  (ZOFRAN ) injection 4 mg  4 mg Intravenous Q6H PRN Starkes-Perry, Majel RAMAN, FNP       thiamine  (VITAMIN B1) tablet 100 mg  100 mg Oral Daily Wilkie Majel RAMAN, FNP   100 mg at 08/20/24 0933   traZODone  (DESYREL ) tablet 50 mg  50 mg Oral QHS Starkes-Perry, Takia S, FNP   50 mg at 08/19/24 2119    Lab Results:  No results found for this or any previous visit (from the past 48 hours).   Blood Alcohol level:  Lab Results  Component Value Date   [facility] <15 05/02/2024   ETH <15 05/01/2024    Metabolic Disorder Labs: Lab Results  Component Value Date   HGBA1C 5.6 05/14/2024   MPG 114.02 05/14/2024   MPG 116.89 05/02/2024   Lab Results  Component Value Date   PROLACTIN 13.6 04/16/2015   Lab Results  Component Value Date   CHOL 219 (H) 05/14/2024   TRIG 84 05/14/2024   HDL 83 05/14/2024   CHOLHDL 2.6 05/14/2024   VLDL 17 05/14/2024   LDLCALC 119 (H) 05/14/2024   LDLCALC 130 (H)  05/01/2024    Physical Findings: AIMS:  , ,  ,  ,    CIWA:    COWS:      Psychiatric Specialty Exam:  Presentation  General Appearance:  Casual  Eye Contact: Fair  Speech: Normal rate  Speech Volume: Increased    Mood and Affect  Mood: Angry  Affect: Congruent   Thought Process  Thought Processes: Disorganized  Descriptions of Associations: Concrete Orientation:Partial  Thought Content: Discharge focused Hallucinations:denies Ideas of Reference:Paranoia; Delusions; Percusatory  Suicidal Thoughts:not endorsing Homicidal Thoughts:not endorsing  Sensorium  Memory: Recent Poor  Judgment: Poor  Insight: Poor   Executive Functions  Concentration: Poor  Attention Span: Poor  Recall: Poor  Fund of Knowledge: Poor  Language: Poor   Psychomotor Activity  Psychomotor Activity:No data recorded Musculoskeletal: Strength & Muscle Tone: within normal limits Gait & Station: normal Assets  Assets: Resilience    Physical Exam: Physical Exam Vitals and nursing note reviewed.    ROS Blood pressure (!) 118/58, pulse 60, temperature (!) 97.1 F (36.2 C), resp. rate 16, height 5' 4 (1.626 m), weight 44.9 kg, SpO2 100%. Body mass index is 16.99 kg/m.  Diagnosis: Active Problems:   Schizophrenia, paranoid (HCC) Paranoid  PLAN: Safety and Monitoring:  -- Involuntary admission to inpatient psychiatric unit for safety, stabilization and treatment  -- Daily contact with patient to assess and evaluate symptoms and progress in treatment  -- Patient's case to be discussed in multi-disciplinary team meeting  -- Observation Level : q15 minute checks  -- Vital signs:  q12 hours  -- Precautions: suicide, elopement, and assault -- Encouraged patient to participate in unit milieu and in scheduled group therapies   2. Psychiatric Diagnoses and Treatment:   Haldol  5 mg BID Zyprexa  15 mg BID   LAI haldol  Dec 100mg  Q4 weeks-07/26/2024  .  Discharge Planning:   -- Social work and case management to assist with discharge planning and identification of hospital follow-up needs prior to discharge  -- Estimated LOS: 3-4 days  Paula Buley, MD 08/20/2024, 1:42 PM "

## 2024-08-20 NOTE — Progress Notes (Signed)
 Pt denies SI/HI/AVH. She voiced no physical complaints. Q 15 min safety checks in place.   08/19/24 2119  Psych Admission Type (Psych Patients Only)  Admission Status Involuntary  Psychosocial Assessment  Patient Complaints Isolation  Eye Contact Brief  Facial Expression Sullen  Affect Sullen  Speech Soft  Interaction Isolative  Motor Activity Slow  Appearance/Hygiene Poor hygiene  Behavior Characteristics Guarded  Mood Sullen  Thought Process  Coherency WDL  Content WDL  Delusions None reported or observed  Perception WDL  Hallucination None reported or observed  Judgment Limited  Confusion Mild  Danger to Self  Current suicidal ideation? Denies  Danger to Others  Danger to Others None reported or observed

## 2024-08-20 NOTE — BH IP Treatment Plan (Signed)
 Interdisciplinary Treatment and Diagnostic Plan Update  08/20/2024 Time of Session: 3:25 pm [name] MRN: [id number]  Principal Diagnosis: <principal problem not specified>  Secondary Diagnoses: Active Problems:   Schizophrenia, paranoid (HCC)   Current Medications:  Current Facility-Administered Medications  Medication Dose Route Frequency Provider Last Rate Last Admin   acetaminophen  (TYLENOL ) tablet 650 mg  650 mg Oral Q6H PRN Starkes-Perry, Takia S, FNP       alum & mag hydroxide-simeth (MAALOX/MYLANTA) 200-200-20 MG/5ML suspension 30 mL  30 mL Oral Q4H PRN Starkes-Perry, Takia S, FNP       haloperidol  (HALDOL ) tablet 5 mg  5 mg Oral BID Jadapalle, Sree, MD   5 mg at 08/20/24 9066   HYDROcodone -acetaminophen  (NORCO/VICODIN) 5-325 MG per tablet 1-2 tablet  1-2 tablet Oral Q4H PRN Starkes-Perry, Takia S, FNP       magnesium  hydroxide (MILK OF MAGNESIA) suspension 30 mL  30 mL Oral Daily PRN Starkes-Perry, Takia S, FNP       multivitamin with minerals tablet 1 tablet  1 tablet Oral Daily Wilkie Majel RAMAN, FNP   1 tablet at 08/20/24 9066   OLANZapine  (ZYPREXA ) injection 5 mg  5 mg Intramuscular TID PRN Wilkie Majel RAMAN, FNP       OLANZapine  (ZYPREXA ) tablet 15 mg  15 mg Oral BID Jadapalle, Sree, MD   15 mg at 08/20/24 0932   OLANZapine  zydis (ZYPREXA ) disintegrating tablet 5 mg  5 mg Oral TID PRN Starkes-Perry, Takia S, FNP       ondansetron  (ZOFRAN ) tablet 4 mg  4 mg Oral Q6H PRN Wilkie Majel RAMAN, FNP       Or   ondansetron  (ZOFRAN ) injection 4 mg  4 mg Intravenous Q6H PRN Starkes-Perry, Majel RAMAN, FNP       thiamine  (VITAMIN B1) tablet 100 mg  100 mg Oral Daily Wilkie Majel RAMAN, FNP   100 mg at 08/20/24 9066   traZODone  (DESYREL ) tablet 50 mg  50 mg Oral QHS Starkes-Perry, Takia S, FNP   50 mg at 08/19/24 2119   PTA Medications: Medications Prior to Admission  Medication Sig Dispense Refill Last Dose/Taking   feeding supplement (ENSURE PLUS HIGH PROTEIN) LIQD  Take 237 mLs by mouth 3 (three) times daily between meals.      Multiple Vitamin (MULTIVITAMIN WITH MINERALS) TABS tablet Take 1 tablet by mouth daily.      OLANZapine  (ZYPREXA ) 5 MG tablet Take 1 tablet (5 mg total) by mouth 2 (two) times daily.      OLANZapine  (ZYPREXA ) injection Inject 1 mL (5 mg total) into the muscle 2 (two) times daily.      thiamine  (VITAMIN B-1) 100 MG tablet Take 1 tablet (100 mg total) by mouth daily.      traZODone  (DESYREL ) 50 MG tablet Take 1 tablet (50 mg total) by mouth at bedtime.       Patient Stressors:    Patient Strengths:    Treatment Modalities: Medication Management, Group therapy, Case management,  1 to 1 session with clinician, Psychoeducation, Recreational therapy.   Physician Treatment Plan for Primary Diagnosis: <principal problem not specified> Long Term Goal(s): Improvement in symptoms so as ready for discharge   Short Term Goals: Ability to identify changes in lifestyle to reduce recurrence of condition will improve  Medication Management: Evaluate patient's response, side effects, and tolerance of medication regimen.  Therapeutic Interventions: 1 to 1 sessions, Unit Group sessions and Medication administration.  Evaluation of Outcomes: Adequate for Discharge  Physician Treatment Plan  for Secondary Diagnosis: Active Problems:   Schizophrenia, paranoid (HCC)  Long Term Goal(s): Improvement in symptoms so as ready for discharge   Short Term Goals: Ability to identify changes in lifestyle to reduce recurrence of condition will improve     Medication Management: Evaluate patient's response, side effects, and tolerance of medication regimen.  Therapeutic Interventions: 1 to 1 sessions, Unit Group sessions and Medication administration.  Evaluation of Outcomes: Adequate for Discharge   RN Treatment Plan for Primary Diagnosis: <principal problem not specified> Long Term Goal(s): Knowledge of disease and therapeutic regimen to maintain  health will improve  Short Term Goals: Ability to remain free from injury will improve, Ability to verbalize frustration and anger appropriately will improve, Ability to demonstrate self-control, Ability to participate in decision making will improve, and Ability to verbalize feelings will improve  Medication Management: RN will administer medications as ordered by provider, will assess and evaluate patient's response and provide education to patient for prescribed medication. RN will report any adverse and/or side effects to prescribing provider.  Therapeutic Interventions: 1 on 1 counseling sessions, Psychoeducation, Medication administration, Evaluate responses to treatment, Monitor vital signs and CBGs as ordered, Perform/monitor CIWA, COWS, AIMS and Fall Risk screenings as ordered, Perform wound care treatments as ordered.  Evaluation of Outcomes: Adequate for Discharge   LCSW Treatment Plan for Primary Diagnosis: <principal problem not specified> Long Term Goal(s): Safe transition to appropriate next level of care at discharge, Engage patient in therapeutic group addressing interpersonal concerns.  Short Term Goals: Engage patient in aftercare planning with referrals and resources, Increase social support, Increase ability to appropriately verbalize feelings, Increase emotional regulation, Facilitate acceptance of mental health diagnosis and concerns, and Facilitate patient progression through stages of change regarding substance use diagnoses and concerns  Therapeutic Interventions: Assess for all discharge needs, 1 to 1 time with Social worker, Explore available resources and support systems, Assess for adequacy in community support network, Educate family and significant other(s) on suicide prevention, Complete Psychosocial Assessment, Interpersonal group therapy.  Evaluation of Outcomes: Adequate for Discharge   Progress in Treatment: Attending groups: No. Participating in groups:  No. Taking medication as prescribed: No. Toleration medication: No. Family/Significant other contact made: No, will contact:  Once permission is provided Patient understands diagnosis: No. Discussing patient identified problems/goals with staff: Yes. Medical problems stabilized or resolved: Yes. Denies suicidal/homicidal ideation: Yes. Issues/concerns per patient self-inventory: No. Other: None  New problem(s) identified: No, Describe:  none 08/20/2024: No changes at this time   New Short Term/Long Term Goal(s):elimination of symptoms of psychosis, medication management for mood stabilization; elimination of SI thoughts; development of comprehensive mental wellness plan. Update: 05/13/2024 No changes at this time.Update: 05/18/2024 No changes at this time. Update 05/23/2024:  No changes at this time. Update 05/28/2024:  No changes at this time. Update 06/02/2024:  No changes at this time. Update 06/07/2024:  No changes at this time. 06/12/24 Update: No changes at this time. Update 06/18/24:  No changes at this time  Update 06/24/24:  No changes at this time Update 06/29/24:  No changes at this time 07/04/24 Update: No changes. 07/09/24: No change 07/14/2024: No changes at this time.  Update 07/19/24: No changes at this time Update 07/25/23: No changes at this time Update 07/29/24 no changes  Update 08/03/2024:  No changes at this time.  Update 08/08/2024: No changes at this time. Update 08/14/2024: No changes at this time. 08/20/2024: No changes at this time   Patient Goals:  I just need to leave, I don't need it hereUpdate: 05/13/2024 No changes at this time.Update: 05/18/2024 No changes at this time. Update 05/23/2024:  No changes at this time.Update 05/28/2024:  No changes at this time. Update 06/02/2024:  No changes at this time. Update 06/07/2024:  No changes at this time. 06/12/24 Update: No changes at this time. Update 06/18/24:  No changes at this time Update 06/24/24:  No changes at this time  Update 06/29/24:  No changes at this time 07/04/24 Update: No changes. 07/09/24: No changes at this time.  07/14/2024: No changes at this time.   Update 07/19/24: No changes at this time Update 07/25/23: No changes at this time Update: 07/29/24  Update 08/03/2024:  No changes at this time. Update 08/08/2024: No changes at this time. Update 08/14/2024: No changes at this time. 08/20/2024: No changes at this time    Discharge Plan or Barriers:  CSW will assist with appropriate discharge planning. Update: 05/13/2024 No changes at this time.Update: 05/18/2024 No changes at this time. Update 05/23/2024:  No changes at this time.Update 05/28/2024:  CSW unable to identify appropriate decision maker, as pt's legal guardian has passed and CSW has reached out to APS caseworker multiple times to get clarity on protocol for enforcing medication until an appropriate party has been legal identified Update 06/02/2024:  No changes at this time. Update 06/07/2024:  CSW and provider still working to clarify appropriate horticulturist, commercial. Please refer to extensive notes in medical record. 06/12/24 Update: CSW and provider still working with DSS to obtain legal decision maker on patient's behalf. Update 06/18/24:  No changes at this time Update 06/24/24:  DSS received ex-parte, DSS to begin looking for placement, Dr.J to initiate enforced medication Update 06/29/24:  No changes at this time 07/04/24 Update: CSW to continue to provide support to APS case worker while DSS works on clinical cytogeneticist. No changes. 07/09/24 07/14/2024: Possible bed at [facility], CSW team to follow up with guardian regarding this.   Update 07/19/24: No changes at this time  Update 07/25/23: No changes at this time  Update 07/29/24 No changes at this time.  Update 08/03/2024:  Placement is still being sought.  Update 08/08/2024: Placement remains a barrier at this time. Update 08/14/2024: No changes at this time. 08/20/2024: No changes at this  time   Reason for Continuation of Hospitalization: Aggression Hallucinations   Estimated Length of Stay:TBD   Update 08/03/2024:  TBD Update 08/08/2024: TBD Update 08/14/2024: TBD  Update 08/20/2024: TBD  Last 3 [place] Suicide Severity Risk Score: Flowsheet Row Admission (Current) from 05/06/2024 in [facility] to Hosp-Admission (Discharged) from 05/02/2024 in [facility] ED from 04/30/2024 in [facility]  C-SSRS RISK CATEGORY No Risk No Risk No Risk    Last [facility] 2/9 Scores:     No data to display          Scribe for Treatment Team: Roselyn GORMAN Lento, KEN 08/20/2024 3:25 PM

## 2024-08-20 NOTE — Plan of Care (Signed)
   Problem: Education: Goal: Emotional status will improve Outcome: Progressing Goal: Mental status will improve Outcome: Progressing Goal: Verbalization of understanding the information provided will improve Outcome: Progressing   Problem: Activity: Goal: Interest or engagement in activities will improve Outcome: Progressing Goal: Sleeping patterns will improve Outcome: Progressing

## 2024-08-20 NOTE — Plan of Care (Signed)
" °  Problem: Coping: Goal: Ability to demonstrate self-control will improve Outcome: Progressing   Problem: Health Behavior/Discharge Planning: Goal: Compliance with treatment plan for underlying cause of condition will improve Outcome: Progressing   Problem: Education: Goal: Verbalization of understanding the information provided will improve Outcome: Not Progressing   "

## 2024-08-20 NOTE — Group Note (Signed)
 Date:  08/20/2024 Time:  9:13 PM  Group Topic/Focus:  Wrap-Up Group:   The focus of this group is to help patients review their daily goal of treatment and discuss progress on daily workbooks.    Participation Level:  Did Not Attend  Participation Quality:     Affect:     Cognitive:     Insight: None  Engagement in Group:  None  Modes of Intervention:     Additional Comments:    [name] 08/20/2024, 9:13 PM

## 2024-08-20 NOTE — Progress Notes (Signed)
 Behavior:   Cooperative.  Isolates to her room.   Psych assessment: Denies SI/HI and AVH.   Group attendance:  0/1  Medication/ PRNs:  Compliant.  Pain: Denies.  15 min checks in place for safety.

## 2024-08-21 NOTE — Progress Notes (Signed)
 Patient is isolative to her room. Q 15 minutes safety checks ongoing. Compliant with medications no adverse effects noted. Denies SI/HI/A/VH and verbally contracts for safety.

## 2024-08-21 NOTE — Plan of Care (Signed)
   Problem: Education: Goal: Mental status will improve Outcome: Progressing Goal: Verbalization of understanding the information provided will improve Outcome: Progressing   Problem: Activity: Goal: Interest or engagement in activities will improve Outcome: Progressing Goal: Sleeping patterns will improve Outcome: Progressing

## 2024-08-21 NOTE — Progress Notes (Signed)
 " [facility] MD Progress Note   12:43 PM [name]  MRN:  [id number]  [name] is a 64 y.o. female admitted: Medicallyfor 05/02/2024 10:56 AM for dehydration, failure to thrive and hypoglycemia. She carries the psychiatric diagnoses of Schizophrenia and has medical hx of hypoglycemia and failure to thrive and has a past medical history of sinus bradycardia, chronic anemia, severe malnutrition and recurrent hypoglycemia.. .  Patient has been psychiatrically stable and cleared for discharge with no safe disposition.  Patient is determined to lack capacity and APS obtained legal guardianship and working on safe disposition.   Subjective:  Chart reviewed, case discussed in multidisciplinary meeting, patient seen during rounds.     Patient is noted to be sitting in her room.  She offers no complaints and remains discharge focused.  She remains paranoid and refuses to answer questions related to her mental health including SI/HI and hallucinations.  Per nursing patient has no behavioral outbursts.  She is taking her medications with no reported side effects.    Past Psychiatric History: see h&P Family History: History reviewed. No pertinent family history. Social History:  Social History   Substance and Sexual Activity  Alcohol Use Not Currently   Comment: Refuses to disclose how much     Social History   Substance and Sexual Activity  Drug Use No   Comment: Refuses to answer    Social History   Socioeconomic History   Marital status: Single    Spouse name: Not on file   Number of children: Not on file   Years of education: Not on file   Highest education level: Not on file  Occupational History   Not on file  Tobacco Use   Smoking status: Every Day    Current packs/day: 2.00    Types: Cigarettes    Passive exposure: Current   Smokeless tobacco: Never  Vaping Use   Vaping status: Never Used  Substance and Sexual Activity   Alcohol use: Not Currently    Comment: Refuses to  disclose how much   Drug use: No    Comment: Refuses to answer   Sexual activity: Not Currently    Comment: refused to answer  Other Topics Concern   Not on file  Social History Narrative   Not on file   Social Drivers of Health   Tobacco Use: High Risk (05/06/2024)   Patient History    Smoking Tobacco Use: Every Day    Smokeless Tobacco Use: Never    Passive Exposure: Current  Financial Resource Strain: Not on file  Food Insecurity: Patient Declined (05/06/2024)   Epic    Worried About Programme Researcher, Broadcasting/film/video in the Last Year: Patient declined    Barista in the Last Year: Patient declined  Transportation Needs: Patient Declined (05/06/2024)   Epic    Lack of Transportation (Medical): Patient declined    Lack of Transportation (Non-Medical): Patient declined  Physical Activity: Not on file  Stress: Not on file  Social Connections: Not on file  Depression (PHQ2-9): Not on file  Alcohol Screen: Low Risk (05/06/2024)   Alcohol Screen    Last Alcohol Screening Score (AUDIT): 0  Housing: Unknown (05/06/2024)   Epic    Unable to Pay for Housing in the Last Year: Patient declined    Number of Times Moved in the Last Year: Not on file    Homeless in the Last Year: Not on file  Utilities: Patient Declined (05/06/2024)   Epic  Threatened with loss of utilities: Patient declined  Health Literacy: Not on file   Past Medical History:  Past Medical History:  Diagnosis Date   Anemia 10/02/2021   Non compliance w medication regimen    Schizophrenia [facility])     Past Surgical History:  Procedure Laterality Date   BIOPSY  09/25/2021   Procedure: BIOPSY;  Surgeon: Teressa Toribio SQUIBB, MD;  Location: [facility];  Service: Gastroenterology;;   ESOPHAGOGASTRODUODENOSCOPY (EGD) WITH PROPOFOL  [name] 09/25/2021   Procedure: ESOPHAGOGASTRODUODENOSCOPY (EGD) WITH PROPOFOL ;  Surgeon: Teressa Toribio SQUIBB, MD;  Location: [facility];  Service: Gastroenterology;  Laterality: [name];  With NGT placement     Current Medications: Current Facility-Administered Medications  Medication Dose Route Frequency Provider Last Rate Last Admin   acetaminophen  (TYLENOL ) tablet 650 mg  650 mg Oral Q6H PRN Starkes-Perry, Takia S, FNP       alum & mag hydroxide-simeth (MAALOX/MYLANTA) 200-200-20 MG/5ML suspension 30 mL  30 mL Oral Q4H PRN Starkes-Perry, Takia S, FNP       haloperidol  (HALDOL ) tablet 5 mg  5 mg Oral BID Tessa Seaberry, MD   5 mg at 08/21/24 9075   HYDROcodone -acetaminophen  (NORCO/VICODIN) 5-325 MG per tablet 1-2 tablet  1-2 tablet Oral Q4H PRN Starkes-Perry, Takia S, FNP       magnesium  hydroxide (MILK OF MAGNESIA) suspension 30 mL  30 mL Oral Daily PRN Starkes-Perry, Takia S, FNP       multivitamin with minerals tablet 1 tablet  1 tablet Oral Daily Wilkie Majel RAMAN, FNP   1 tablet at 08/21/24 9075   OLANZapine  (ZYPREXA ) injection 5 mg  5 mg Intramuscular TID PRN Wilkie Majel RAMAN, FNP       OLANZapine  (ZYPREXA ) tablet 15 mg  15 mg Oral BID Hideko Esselman, MD   15 mg at 08/21/24 9075   OLANZapine  zydis (ZYPREXA ) disintegrating tablet 5 mg  5 mg Oral TID PRN Starkes-Perry, Takia S, FNP       ondansetron  (ZOFRAN ) tablet 4 mg  4 mg Oral Q6H PRN Wilkie Majel RAMAN, FNP       Or   ondansetron  (ZOFRAN ) injection 4 mg  4 mg Intravenous Q6H PRN Starkes-Perry, Majel RAMAN, FNP       thiamine  (VITAMIN B1) tablet 100 mg  100 mg Oral Daily Starkes-Perry, Takia S, FNP   100 mg at 08/21/24 9075   traZODone  (DESYREL ) tablet 50 mg  50 mg Oral QHS Starkes-Perry, Takia S, FNP   50 mg at 08/20/24 2111    Lab Results:  No results found for this or any previous visit (from the past 48 hours).   Blood Alcohol level:  Lab Results  Component Value Date   [facility] <15 05/02/2024   ETH <15 05/01/2024    Metabolic Disorder Labs: Lab Results  Component Value Date   HGBA1C 5.6 05/14/2024   MPG 114.02 05/14/2024   MPG 116.89 05/02/2024   Lab Results  Component Value Date   PROLACTIN 13.6  04/16/2015   Lab Results  Component Value Date   CHOL 219 (H) 05/14/2024   TRIG 84 05/14/2024   HDL 83 05/14/2024   CHOLHDL 2.6 05/14/2024   VLDL 17 05/14/2024   LDLCALC 119 (H) 05/14/2024   LDLCALC 130 (H) 05/01/2024    Physical Findings: AIMS:  , ,  ,  ,    CIWA:    COWS:      Psychiatric Specialty Exam:  Presentation  General Appearance:  Casual  Eye Contact: Fair  Speech: Normal rate  Speech Volume: Increased    Mood and Affect  Mood: Angry  Affect: Congruent   Thought Process  Thought Processes: Disorganized  Descriptions of Associations: Concrete Orientation:Partial  Thought Content: Discharge focused Hallucinations:denies Ideas of Reference:Paranoia; Delusions; Percusatory  Suicidal Thoughts:not endorsing Homicidal Thoughts:not endorsing  Sensorium  Memory: Recent Poor  Judgment: Poor  Insight: Poor   Executive Functions  Concentration: Poor  Attention Span: Poor  Recall: Poor  Fund of Knowledge: Poor  Language: Poor   Psychomotor Activity  Psychomotor Activity:No data recorded Musculoskeletal: Strength & Muscle Tone: within normal limits Gait & Station: normal Assets  Assets: Resilience    Physical Exam: Physical Exam Vitals and nursing note reviewed.    ROS Blood pressure 109/68, pulse 72, temperature (!) 97.3 F (36.3 C), resp. rate 14, height 5' 4 (1.626 m), weight 44.9 kg, SpO2 100%. Body mass index is 16.99 kg/m.  Diagnosis: Active Problems:   Schizophrenia, paranoid (HCC) Paranoid  PLAN: Safety and Monitoring:  -- Involuntary admission to inpatient psychiatric unit for safety, stabilization and treatment  -- Daily contact with patient to assess and evaluate symptoms and progress in treatment  -- Patient's case to be discussed in multi-disciplinary team meeting  -- Observation Level : q15 minute checks  -- Vital signs:  q12 hours  -- Precautions: suicide, elopement, and assault --  Encouraged patient to participate in unit milieu and in scheduled group therapies   2. Psychiatric Diagnoses and Treatment:   Haldol  5 mg BID Zyprexa  15 mg BID   LAI haldol  Dec 100mg  Q4 weeks-07/26/2024  . Discharge Planning:   -- Social work and case management to assist with discharge planning and identification of hospital follow-up needs prior to discharge  -- Estimated LOS: 3-4 days  Paula Grajeda, MD 08/21/2024, 12:43 PM "

## 2024-08-21 NOTE — Plan of Care (Signed)
   Problem: Education: Goal: Knowledge of [facility] Education information/materials will improve Outcome: Progressing Goal: Emotional status will improve Outcome: Progressing Goal: Mental status will improve Outcome: Progressing Goal: Verbalization of understanding the information provided will improve Outcome: Progressing   Problem: Activity: Goal: Interest or engagement in activities will improve Outcome: Progressing Goal: Sleeping patterns will improve Outcome: Progressing

## 2024-08-21 NOTE — Group Note (Signed)
 Physical/Occupational Therapy Group Note  Group Topic: Functional, Dynamic Balance   Group Date: 08/21/2024 Start Time: 1300 End Time: 1330 Facilitators: Grey Rakestraw, Alm Hamilton, PT   Group Description: Group discussed impact of balance on safety and independence with functional tasks.  Identified and discussed any self-perceived balance deficits to personalize information.  Discussed and reviewed strategies to address/improve balance deficits: use of assist devices, activity pacing/energy conservation, environment/home safety modifications, focusing attention/minimizing distraction.  Reviewed and participated with standing LE therex designed to target dynamic balance reactions and LE strength/stability; provided handouts with HEP to be utilized outside of group time as appropriate.  Allowed time for questions and further discussion on any balance or mobility concerns/needs.  Therapeutic Goal(s):  Identify and discuss any individual balance deficits and functional implications. Identify and discuss any environmental/home safety modifications that can optimize balance and safety for mobility within the home. Demonstrate understanding and performance of standing therex designed to target dynamic balance deficits.  Individual Participation: Did not attend  Participation Level:   Participation Quality:   Behavior:   Speech/Thought Process:   Affect/Mood:   Insight:   Judgement:   Modes of Intervention:   Patient Response to Interventions:    Plan: Continue to engage patient in PT/OT groups 1 - 2x/week.  CHARM Hamilton Bertin PT, DPT 08/21/24, 1:36 PM

## 2024-08-21 NOTE — Group Note (Signed)
 Date:  08/21/2024 Time:  9:05 PM  Group Topic/Focus:  Wrap-Up Group:   The focus of this group is to help patients review their daily goal of treatment and discuss progress on daily workbooks.    Participation Level:  Did Not Attend  Participation Quality:     Affect:     Cognitive:     Insight: None  Engagement in Group:  None  Modes of Intervention:     Additional Comments:    [name] [name] 08/21/2024, 9:05 PM

## 2024-08-21 NOTE — Group Note (Signed)
 Date:  08/21/2024 Time:  2:16 PM  Group Topic/Focus:  Coping With Mental Health Crisis:   The purpose of this group is to help patients identify strategies for coping with mental health crisis.  Group discusses possible causes of crisis and ways to manage them effectively. Managing Feelings:   The focus of this group is to identify what feelings patients have difficulty handling and develop a plan to handle them in a healthier way upon discharge.    Participation Level:  Did Not Attend  Participation Quality:   Affect:    Cognitive:    Insight:   Engagement in Group:    Modes of Intervention:    Additional Comments:    Neftali Thurow L Tawana Pasch 08/21/2024, 2:16 PM

## 2024-08-22 NOTE — Progress Notes (Signed)
" °   08/21/24 2329  Psych Admission Type (Psych Patients Only)  Admission Status Voluntary  Psychosocial Assessment  Patient Complaints Isolation  Eye Contact Brief  Facial Expression Flat  Affect Flat  Speech Soft  Interaction Isolative  Motor Activity Slow  Appearance/Hygiene In scrubs  Behavior Characteristics Guarded  Mood Sullen  Thought Process  Coherency WDL  Content WDL  Delusions None reported or observed  Perception WDL  Hallucination None reported or observed  Judgment Limited  Confusion Mild  Danger to Self  Current suicidal ideation? Denies  Description of Suicide Plan n/a  Self-Injurious Behavior No self-injurious ideation or behavior indicators observed or expressed   Agreement Not to Harm Self Yes  Description of Agreement verbal  Danger to Others  Danger to Others None reported or observed     Estimated Sleeping Duration (Last 24 Hours): 9.00-11.00 hours  "

## 2024-08-22 NOTE — Plan of Care (Signed)
  Problem: Activity: Goal: Interest or engagement in activities will improve Outcome: Progressing Goal: Sleeping patterns will improve Outcome: Progressing   Problem: Health Behavior/Discharge Planning: Goal: Compliance with treatment plan for underlying cause of condition will improve Outcome: Progressing   

## 2024-08-22 NOTE — Group Note (Signed)
 Date:  08/22/2024 Time:  4:24 PM  Group Topic/Focus:  Developing a Wellness Toolbox:   The focus of this group is to help patients develop a wellness toolbox with skills and strategies to promote recovery upon discharge.    Participation Level:  Minimal  Participation Quality:    Affect:    Cognitive:  Appropriate  Insight: Appropriate  Engagement in Group:  None  Modes of Intervention:    Additional Comments:    Joey Hudock 08/22/2024, 4:24 PM

## 2024-08-22 NOTE — Plan of Care (Signed)
  Problem: Education: Goal: Emotional status will improve Outcome: Not Progressing Goal: Mental status will improve Outcome: Not Progressing Goal: Verbalization of understanding the information provided will improve Outcome: Not Progressing   Problem: Activity: Goal: Interest or engagement in activities will improve Outcome: Not Progressing Goal: Sleeping patterns will improve Outcome: Not Progressing   Problem: Coping: Goal: Ability to verbalize frustrations and anger appropriately will improve Outcome: Not Progressing   

## 2024-08-23 NOTE — [facility] Counselor (Signed)
 CSW sent email to Etha Chang to check in on placement for pt.   CSW also requested an update on signatures on ROI forms and Medicare IM.   CSW awaits response at this time.   [name] Croft, MSW, [place] 08/23/2024 10:31 AM

## 2024-08-23 NOTE — Group Note (Signed)
 Date:  08/23/2024 Time:  9:43 AM  Group Topic/Focus:  Movement Therapy, Morning stretch with Tine Mabee.    Participation Level:  Did Not Attend    [name] Bias 08/23/2024, 9:43 AM

## 2024-08-23 NOTE — Plan of Care (Signed)
" °  Problem: Education: Goal: Knowledge of [facility] Education information/materials will improve Outcome: Completed/Met Goal: Emotional status will improve Outcome: Completed/Met Goal: Mental status will improve Outcome: Completed/Met Goal: Verbalization of understanding the information provided will improve Outcome: Completed/Met   Problem: Activity: Goal: Interest or engagement in activities will improve Outcome: Completed/Met Goal: Sleeping patterns will improve Outcome: Completed/Met   Problem: Coping: Goal: Ability to verbalize frustrations and anger appropriately will improve Outcome: Completed/Met Goal: Ability to demonstrate self-control will improve Outcome: Completed/Met   Problem: Health Behavior/Discharge Planning: Goal: Identification of resources available to assist in meeting health care needs will improve Outcome: Completed/Met Goal: Compliance with treatment plan for underlying cause of condition will improve Outcome: Completed/Met   Problem: Physical Regulation: Goal: Ability to maintain clinical measurements within normal limits will improve Outcome: Completed/Met   Problem: Safety: Goal: Periods of time without injury will increase Outcome: Completed/Met   Problem: Education: Goal: Knowledge of General Education information will improve Description: Including pain rating scale, medication(s)/side effects and non-pharmacologic comfort measures Outcome: Completed/Met   Problem: Health Behavior/Discharge Planning: Goal: Ability to manage health-related needs will improve Outcome: Completed/Met   Problem: Clinical Measurements: Goal: Ability to maintain clinical measurements within normal limits will improve Outcome: Completed/Met Goal: Will remain free from infection Outcome: Completed/Met Goal: Diagnostic test results will improve Outcome: Completed/Met Goal: Respiratory complications will improve Outcome: Completed/Met Goal:  Cardiovascular complication will be avoided Outcome: Completed/Met   Problem: Activity: Goal: Risk for activity intolerance will decrease Outcome: Completed/Met   Problem: Nutrition: Goal: Adequate nutrition will be maintained Outcome: Completed/Met   Problem: Coping: Goal: Level of anxiety will decrease Outcome: Completed/Met   Problem: Elimination: Goal: Will not experience complications related to bowel motility Outcome: Completed/Met Goal: Will not experience complications related to urinary retention Outcome: Completed/Met   Problem: Pain Managment: Goal: General experience of comfort will improve and/or be controlled Outcome: Completed/Met   Problem: Safety: Goal: Ability to remain free from injury will improve Outcome: Completed/Met   Problem: Skin Integrity: Goal: Risk for impaired skin integrity will decrease Outcome: Completed/Met   Problem: Health Behavior/Discharge Planning: Goal: Compliance with prescribed medication regimen will improve Outcome: Completed/Met   Problem: Nutritional: Goal: Ability to achieve adequate nutritional intake will improve Outcome: Completed/Met   "

## 2024-08-23 NOTE — Group Note (Signed)
 Date:  08/23/2024 Time:  9:18 PM  Group Topic/Focus:  Wrap-Up Group:   The focus of this group is to help patients review their daily goal of treatment and discuss progress on daily workbooks.    Participation Level:  Did Not Attend  Participation Quality:     Affect:     Cognitive:     Insight: None  Engagement in Group:    Modes of Intervention:     Additional Comments:    Denishia Citro C Vernie Vinciguerra 08/23/2024, 9:18 PM

## 2024-08-23 NOTE — Group Note (Signed)
 [facility] LCSW Group Therapy Note   Group Date: 08/22/2024 Start Time: 1330 End Time: 1345  Type of Therapy/Topic:  Group Therapy:  Feelings about Diagnosis  Participation Level:  Did Not Attend   Mood: X   Description of Group:    This group will allow patients to explore their thoughts and feelings about diagnoses they have received. Patients will be guided to explore their level of understanding and acceptance of these diagnoses. Facilitator will encourage patients to process their thoughts and feelings about the reactions of others to their diagnosis, and will guide patients in identifying ways to discuss their diagnosis with significant others in their lives. This group will be process-oriented, with patients participating in exploration of their own experiences as well as giving and receiving support and challenge from other group members.   Therapeutic Goals: 1. Patient will demonstrate understanding of diagnosis as evidence by identifying two or more symptoms of the disorder:  2. Patient will be able to express two feelings regarding the diagnosis 3. Patient will demonstrate ability to communicate their needs through discussion and/or role plays  Summary of Patient Progress:    X    Therapeutic Modalities:   Cognitive Behavioral Therapy Brief Therapy Feelings Identification    [name], LCSWA

## 2024-08-23 NOTE — Progress Notes (Signed)
 Pt voiced no complaints this shift. Medication compliant. She is safe on the unit at this time with Q 15 min safety checks in place.   08/22/24 2132  Psych Admission Type (Psych Patients Only)  Admission Status Involuntary  Psychosocial Assessment  Patient Complaints None  Eye Contact Brief  Facial Expression Flat  Affect Flat  Speech Soft  Interaction Isolative  Motor Activity Slow  Appearance/Hygiene In scrubs  Behavior Characteristics Cooperative  Mood Sullen  Thought Process  Coherency WDL  Content WDL  Delusions None reported or observed  Perception WDL  Hallucination None reported or observed  Judgment Limited  Confusion None  Danger to Self  Current suicidal ideation? Denies  Self-Injurious Behavior No self-injurious ideation or behavior indicators observed or expressed   Danger to Others  Danger to Others None reported or observed

## 2024-08-23 NOTE — Progress Notes (Signed)
" °   08/22/24 2132  Psych Admission Type (Psych Patients Only)  Admission Status Involuntary  Psychosocial Assessment  Patient Complaints None  Eye Contact Brief  Facial Expression Flat  Affect Flat  Speech Soft  Interaction Isolative  Motor Activity Slow  Appearance/Hygiene In scrubs  Behavior Characteristics Cooperative  Mood Sullen  Thought Process  Coherency WDL  Content WDL  Delusions None reported or observed  Perception WDL  Hallucination None reported or observed  Judgment Limited  Confusion None  Danger to Self  Current suicidal ideation? Denies  Self-Injurious Behavior No self-injurious ideation or behavior indicators observed or expressed   Danger to Others  Danger to Others None reported or observed    "

## 2024-08-23 NOTE — Progress Notes (Signed)
" °   08/23/24 2000  Psych Admission Type (Psych Patients Only)  Admission Status Involuntary  Psychosocial Assessment  Patient Complaints None  Eye Contact Brief  Facial Expression Flat  Affect Flat  Speech Soft  Interaction Isolative  Motor Activity Slow  Appearance/Hygiene In scrubs  Behavior Characteristics Cooperative;Appropriate to situation  Mood Sullen  Thought Process  Coherency WDL  Content WDL  Delusions None reported or observed  Perception WDL  Hallucination None reported or observed  Judgment Limited  Confusion None  Danger to Self  Current suicidal ideation? Denies  Self-Injurious Behavior No self-injurious ideation or behavior indicators observed or expressed   Agreement Not to Harm Self Yes  Description of Agreement verbal  Danger to Others  Danger to Others None reported or observed    "

## 2024-08-23 NOTE — Plan of Care (Signed)
   Problem: Education: Goal: Emotional status will improve Outcome: Progressing Goal: Mental status will improve Outcome: Progressing Goal: Verbalization of understanding the information provided will improve Outcome: Progressing   Problem: Activity: Goal: Interest or engagement in activities will improve Outcome: Progressing

## 2024-08-23 NOTE — Progress Notes (Signed)
 " [facility] MD Progress Note   12:40 PM [name]  MRN:  [id number]  [name] is a 64 y.o. female admitted: Medicallyfor 05/02/2024 10:56 AM for dehydration, failure to thrive and hypoglycemia. She carries the psychiatric diagnoses of Schizophrenia and has medical hx of hypoglycemia and failure to thrive and has a past medical history of sinus bradycardia, chronic anemia, severe malnutrition and recurrent hypoglycemia.. .  Patient has been psychiatrically stable and cleared for discharge with no safe disposition.  Patient is determined to lack capacity and APS obtained legal guardianship and working on safe disposition.   Subjective:  Chart reviewed, case discussed in multidisciplinary meeting, patient seen during rounds.     Patient is noted to be resting in her room.  She offers no complaints.  Per nursing staff patient is visible on the unit sits in the day area but remains minimally engaging with other peers.  Per nursing no behavioral problems and she takes her medications with no complaints.  She remains paranoid and refuses to engage in conversations about her treatment.  With the provider she continues to refuse answer questions related to mental health.   Past Psychiatric History: see h&P Family History: History reviewed. No pertinent family history. Social History:  Social History   Substance and Sexual Activity  Alcohol Use Not Currently   Comment: Refuses to disclose how much     Social History   Substance and Sexual Activity  Drug Use No   Comment: Refuses to answer    Social History   Socioeconomic History   Marital status: Single    Spouse name: Not on file   Number of children: Not on file   Years of education: Not on file   Highest education level: Not on file  Occupational History   Not on file  Tobacco Use   Smoking status: Every Day    Current packs/day: 2.00    Types: Cigarettes    Passive exposure: Current   Smokeless tobacco: Never  Vaping Use    Vaping status: Never Used  Substance and Sexual Activity   Alcohol use: Not Currently    Comment: Refuses to disclose how much   Drug use: No    Comment: Refuses to answer   Sexual activity: Not Currently    Comment: refused to answer  Other Topics Concern   Not on file  Social History Narrative   Not on file   Social Drivers of Health   Tobacco Use: High Risk (05/06/2024)   Patient History    Smoking Tobacco Use: Every Day    Smokeless Tobacco Use: Never    Passive Exposure: Current  Financial Resource Strain: Not on file  Food Insecurity: Patient Declined (05/06/2024)   Epic    Worried About Programme Researcher, Broadcasting/film/video in the Last Year: Patient declined    Barista in the Last Year: Patient declined  Transportation Needs: Patient Declined (05/06/2024)   Epic    Lack of Transportation (Medical): Patient declined    Lack of Transportation (Non-Medical): Patient declined  Physical Activity: Not on file  Stress: Not on file  Social Connections: Not on file  Depression (PHQ2-9): Not on file  Alcohol Screen: Low Risk (05/06/2024)   Alcohol Screen    Last Alcohol Screening Score (AUDIT): 0  Housing: Unknown (05/06/2024)   Epic    Unable to Pay for Housing in the Last Year: Patient declined    Number of Times Moved in the Last Year: Not on  file    Homeless in the Last Year: Not on file  Utilities: Patient Declined (05/06/2024)   Epic    Threatened with loss of utilities: Patient declined  Health Literacy: Not on file   Past Medical History:  Past Medical History:  Diagnosis Date   Anemia 10/02/2021   Non compliance w medication regimen    Schizophrenia [name])     Past Surgical History:  Procedure Laterality Date   BIOPSY  09/25/2021   Procedure: BIOPSY;  Surgeon: Teressa Toribio SQUIBB, MD;  Location: [facility];  Service: Gastroenterology;;   ESOPHAGOGASTRODUODENOSCOPY (EGD) WITH PROPOFOL  [name] 09/25/2021   Procedure: ESOPHAGOGASTRODUODENOSCOPY (EGD) WITH PROPOFOL ;  Surgeon:  Teressa Toribio SQUIBB, MD;  Location: [facility];  Service: Gastroenterology;  Laterality: [name];  With NGT placement    Current Medications: Current Facility-Administered Medications  Medication Dose Route Frequency Provider Last Rate Last Admin   acetaminophen  (TYLENOL ) tablet 650 mg  650 mg Oral Q6H PRN Starkes-Perry, Takia S, FNP       alum & mag hydroxide-simeth (MAALOX/MYLANTA) 200-200-20 MG/5ML suspension 30 mL  30 mL Oral Q4H PRN Starkes-Perry, Takia S, FNP       haloperidol  (HALDOL ) tablet 5 mg  5 mg Oral BID Tyla Burgner, MD   5 mg at 08/23/24 9085   HYDROcodone -acetaminophen  (NORCO/VICODIN) 5-325 MG per tablet 1-2 tablet  1-2 tablet Oral Q4H PRN Starkes-Perry, Takia S, FNP       magnesium  hydroxide (MILK OF MAGNESIA) suspension 30 mL  30 mL Oral Daily PRN Starkes-Perry, Takia S, FNP       multivitamin with minerals tablet 1 tablet  1 tablet Oral Daily Wilkie Majel RAMAN, FNP   1 tablet at 08/23/24 9085   OLANZapine  (ZYPREXA ) injection 5 mg  5 mg Intramuscular TID PRN Wilkie Majel RAMAN, FNP       OLANZapine  (ZYPREXA ) tablet 15 mg  15 mg Oral BID Paras Kreider, MD   15 mg at 08/23/24 0915   OLANZapine  zydis (ZYPREXA ) disintegrating tablet 5 mg  5 mg Oral TID PRN Starkes-Perry, Takia S, FNP       ondansetron  (ZOFRAN ) tablet 4 mg  4 mg Oral Q6H PRN Starkes-Perry, Majel RAMAN, FNP       Or   ondansetron  (ZOFRAN ) injection 4 mg  4 mg Intravenous Q6H PRN Starkes-Perry, Majel RAMAN, FNP       thiamine  (VITAMIN B1) tablet 100 mg  100 mg Oral Daily Wilkie Majel RAMAN, FNP   100 mg at 08/23/24 0915   traZODone  (DESYREL ) tablet 50 mg  50 mg Oral QHS Starkes-Perry, Takia S, FNP   50 mg at 08/22/24 2133    Lab Results:  No results found for this or any previous visit (from the past 48 hours).   Blood Alcohol level:  Lab Results  Component Value Date   [facility] <15 05/02/2024   ETH <15 05/01/2024    Metabolic Disorder Labs: Lab Results  Component Value Date   HGBA1C 5.6 05/14/2024    MPG 114.02 05/14/2024   MPG 116.89 05/02/2024   Lab Results  Component Value Date   PROLACTIN 13.6 04/16/2015   Lab Results  Component Value Date   CHOL 219 (H) 05/14/2024   TRIG 84 05/14/2024   HDL 83 05/14/2024   CHOLHDL 2.6 05/14/2024   VLDL 17 05/14/2024   LDLCALC 119 (H) 05/14/2024   LDLCALC 130 (H) 05/01/2024    Physical Findings: AIMS:  , ,  ,  ,    CIWA:    COWS:  Psychiatric Specialty Exam:  Presentation  General Appearance:  Casual  Eye Contact: Fair  Speech: Normal rate  Speech Volume: Increased    Mood and Affect  Mood: Angry  Affect: Congruent   Thought Process  Thought Processes: Disorganized  Descriptions of Associations: Concrete Orientation:Partial  Thought Content: Discharge focused Hallucinations:denies Ideas of Reference:Paranoia; Delusions; Percusatory  Suicidal Thoughts:not endorsing Homicidal Thoughts:not endorsing  Sensorium  Memory: Recent Poor  Judgment: Poor  Insight: Poor   Executive Functions  Concentration: Poor  Attention Span: Poor  Recall: Poor  Fund of Knowledge: Poor  Language: Poor   Psychomotor Activity  Psychomotor Activity:No data recorded Musculoskeletal: Strength & Muscle Tone: within normal limits Gait & Station: normal Assets  Assets: Resilience    Physical Exam: Physical Exam Vitals and nursing note reviewed.    ROS Blood pressure 122/61, pulse 61, temperature (!) 97.5 F (36.4 C), resp. rate 14, height 5' 4 (1.626 m), weight 44.9 kg, SpO2 100%. Body mass index is 16.99 kg/m.  Diagnosis: Active Problems:   Schizophrenia, paranoid (HCC) Paranoid  PLAN: Safety and Monitoring:  -- Involuntary admission to inpatient psychiatric unit for safety, stabilization and treatment  -- Daily contact with patient to assess and evaluate symptoms and progress in treatment  -- Patient's case to be discussed in multi-disciplinary team meeting  -- Observation Level :  q15 minute checks  -- Vital signs:  q12 hours  -- Precautions: suicide, elopement, and assault -- Encouraged patient to participate in unit milieu and in scheduled group therapies   2. Psychiatric Diagnoses and Treatment:   Haldol  5 mg BID Zyprexa  15 mg BID   LAI haldol  Dec 100mg  Q4 weeks-07/26/2024  . Discharge Planning:   -- Social work and case management to assist with discharge planning and identification of hospital follow-up needs prior to discharge  -- Estimated LOS: 3-4 days  Felecia Stanfill, MD 08/23/2024, 12:40 PM "

## 2024-08-23 NOTE — Progress Notes (Signed)
" °   08/23/24 1000  Psych Admission Type (Psych Patients Only)  Admission Status Involuntary  Psychosocial Assessment  Patient Complaints None  Eye Contact Brief  Facial Expression Flat  Affect Flat  Speech Soft  Interaction Isolative  Motor Activity Slow  Appearance/Hygiene In scrubs  Behavior Characteristics Cooperative  Mood Sullen  Thought Process  Coherency WDL  Content WDL  Delusions None reported or observed  Perception WDL  Hallucination None reported or observed  Judgment Limited  Confusion None  Danger to Self  Current suicidal ideation? Denies  Agreement Not to Harm Self Yes  Description of Agreement verbal  Danger to Others  Danger to Others None reported or observed    "

## 2024-08-24 NOTE — Group Note (Signed)
 Recreation Therapy Group Note   Group Topic:Animal Assisted Therapy   Group Date: 08/24/2024 Start Time: 1015 End Time: 1040 Facilitators: Celestia Jeoffrey BRAVO, LRT, CTRS Location: Dayroom  Group Description: AAA. Animal-Assisted Activity provides opportunities for motivational, educational, therapeutic and/or recreational benefits to enhance quality of life. Selinda and Rollo visited the unit to interact with patients.   Goal Areas Addressed:  Reduced anxiety and stress Improved mood Increased social interaction Enhanced communication skills Reduced loneliness and isolation Improved emotional regulation   Affect/Mood: [name]   Participation Level: Did not attend    Clinical Observations/Individualized Feedback: Patient did not attend.  Plan: Continue to engage patient in RT group sessions 2-3x/week.   Jeoffrey BRAVO Celestia, LRT, CTRS 08/24/2024 11:16 AM

## 2024-08-24 NOTE — Group Note (Signed)
 LCSW Group Therapy Note  Group Date: 08/24/2024 Start Time: 1315 End Time: 1345   Type of Therapy and Topic:  Group Therapy - Healthy vs Unhealthy Coping Skills  Participation Level:  Did Not Attend   Description of Group The focus of this group was to determine what unhealthy coping techniques typically are used by group members and what healthy coping techniques would be helpful in coping with various problems. Patients were guided in becoming aware of the differences between healthy and unhealthy coping techniques. Patients were asked to identify 2-3 healthy coping skills they would like to learn to use more effectively.  Therapeutic Goals Patients learned that coping is what human beings do all day long to deal with various situations in their lives Patients defined and discussed healthy vs unhealthy coping techniques Patients identified their preferred coping techniques and identified whether these were healthy or unhealthy Patients determined 2-3 healthy coping skills they would like to become more familiar with and use more often. Patients provided support and ideas to each other   Summary of Patient Progress:  X  Therapeutic Modalities Cognitive Behavioral Therapy Motivational Interviewing  [name], [place] 08/24/2024  2:12 PM

## 2024-08-24 NOTE — Group Note (Signed)
 Date:  08/24/2024 Time:  3:27 PM  Group Topic/Focus:  Making Healthy Choices:   The focus of this group is to help patients identify negative/unhealthy choices they were using prior to admission and identify positive/healthier coping strategies to replace them upon discharge.    Participation Level:  Did Not Attend   [name] 08/24/2024, 3:27 PM

## 2024-08-24 NOTE — Progress Notes (Signed)
" °   08/24/24 2100  Psych Admission Type (Psych Patients Only)  Admission Status Involuntary  Psychosocial Assessment  Patient Complaints None  Eye Contact Brief  Facial Expression Flat  Affect Flat  Speech Soft  Interaction Isolative  Motor Activity Slow  Appearance/Hygiene In scrubs  Behavior Characteristics Cooperative;Appropriate to situation  Mood Preoccupied  Thought Process  Coherency WDL  Content WDL  Delusions None reported or observed  Perception WDL  Hallucination None reported or observed  Judgment Limited  Confusion None  Danger to Self  Current suicidal ideation? Denies  Self-Injurious Behavior No self-injurious ideation or behavior indicators observed or expressed   Agreement Not to Harm Self Yes  Description of Agreement verbal  Danger to Others  Danger to Others None reported or observed    "

## 2024-08-24 NOTE — Progress Notes (Signed)
 " [facility] MD Progress Note   12:18 PM [name]  MRN:  [id number]  [name] is a 64 y.o. female admitted: Medicallyfor 05/02/2024 10:56 AM for dehydration, failure to thrive and hypoglycemia. She carries the psychiatric diagnoses of Schizophrenia and has medical hx of hypoglycemia and failure to thrive and has a past medical history of sinus bradycardia, chronic anemia, severe malnutrition and recurrent hypoglycemia.. .  Patient has been psychiatrically stable and cleared for discharge with no safe disposition.  Patient is determined to lack capacity and APS obtained legal guardianship and working on safe disposition.   Subjective:  Chart reviewed, case discussed in multidisciplinary meeting, patient seen during rounds.    Patient is noted to be walking in her room.  She offers no complaints.  She remains minimally engaging regarding her mental health problems.  She remains discharge focused stating she needs to go but is agreeable to engage in discussions related to discharge planning.  Per nursing she is taking her medications with no reported side effects.  Patient is not displaying any self-harming behaviors.  Past Psychiatric History: see h&P Family History: History reviewed. No pertinent family history. Social History:  Social History   Substance and Sexual Activity  Alcohol Use Not Currently   Comment: Refuses to disclose how much     Social History   Substance and Sexual Activity  Drug Use No   Comment: Refuses to answer    Social History   Socioeconomic History   Marital status: Single    Spouse name: Not on file   Number of children: Not on file   Years of education: Not on file   Highest education level: Not on file  Occupational History   Not on file  Tobacco Use   Smoking status: Every Day    Current packs/day: 2.00    Types: Cigarettes    Passive exposure: Current   Smokeless tobacco: Never  Vaping Use   Vaping status: Never Used  Substance and Sexual  Activity   Alcohol use: Not Currently    Comment: Refuses to disclose how much   Drug use: No    Comment: Refuses to answer   Sexual activity: Not Currently    Comment: refused to answer  Other Topics Concern   Not on file  Social History Narrative   Not on file   Social Drivers of Health   Tobacco Use: High Risk (05/06/2024)   Patient History    Smoking Tobacco Use: Every Day    Smokeless Tobacco Use: Never    Passive Exposure: Current  Financial Resource Strain: Not on file  Food Insecurity: Patient Declined (05/06/2024)   Epic    Worried About Programme Researcher, Broadcasting/film/video in the Last Year: Patient declined    Barista in the Last Year: Patient declined  Transportation Needs: Patient Declined (05/06/2024)   Epic    Lack of Transportation (Medical): Patient declined    Lack of Transportation (Non-Medical): Patient declined  Physical Activity: Not on file  Stress: Not on file  Social Connections: Not on file  Depression (PHQ2-9): Not on file  Alcohol Screen: Low Risk (05/06/2024)   Alcohol Screen    Last Alcohol Screening Score (AUDIT): 0  Housing: Unknown (05/06/2024)   Epic    Unable to Pay for Housing in the Last Year: Patient declined    Number of Times Moved in the Last Year: Not on file    Homeless in the Last Year: Not on file  Utilities: Patient Declined (05/06/2024)   Epic    Threatened with loss of utilities: Patient declined  Health Literacy: Not on file   Past Medical History:  Past Medical History:  Diagnosis Date   Anemia 10/02/2021   Non compliance w medication regimen    Schizophrenia [facility])     Past Surgical History:  Procedure Laterality Date   BIOPSY  09/25/2021   Procedure: BIOPSY;  Surgeon: Teressa Toribio SQUIBB, MD;  Location: [facility];  Service: Gastroenterology;;   ESOPHAGOGASTRODUODENOSCOPY (EGD) WITH PROPOFOL  [name] 09/25/2021   Procedure: ESOPHAGOGASTRODUODENOSCOPY (EGD) WITH PROPOFOL ;  Surgeon: Teressa Toribio SQUIBB, MD;  Location: [facility];   Service: Gastroenterology;  Laterality: [name];  With NGT placement    Current Medications: Current Facility-Administered Medications  Medication Dose Route Frequency Provider Last Rate Last Admin   acetaminophen  (TYLENOL ) tablet 650 mg  650 mg Oral Q6H PRN Starkes-Perry, Takia S, FNP       alum & mag hydroxide-simeth (MAALOX/MYLANTA) 200-200-20 MG/5ML suspension 30 mL  30 mL Oral Q4H PRN Starkes-Perry, Takia S, FNP       haloperidol  (HALDOL ) tablet 5 mg  5 mg Oral BID Mariabella Nilsen, MD   5 mg at 08/24/24 0909   HYDROcodone -acetaminophen  (NORCO/VICODIN) 5-325 MG per tablet 1-2 tablet  1-2 tablet Oral Q4H PRN Starkes-Perry, Takia S, FNP       magnesium  hydroxide (MILK OF MAGNESIA) suspension 30 mL  30 mL Oral Daily PRN Starkes-Perry, Takia S, FNP       multivitamin with minerals tablet 1 tablet  1 tablet Oral Daily Wilkie Majel RAMAN, FNP   1 tablet at 08/24/24 9090   OLANZapine  (ZYPREXA ) injection 5 mg  5 mg Intramuscular TID PRN Starkes-Perry, Takia S, FNP       OLANZapine  (ZYPREXA ) tablet 15 mg  15 mg Oral BID Jonaven Hilgers, MD   15 mg at 08/24/24 9089   OLANZapine  zydis (ZYPREXA ) disintegrating tablet 5 mg  5 mg Oral TID PRN Starkes-Perry, Takia S, FNP       ondansetron  (ZOFRAN ) tablet 4 mg  4 mg Oral Q6H PRN Starkes-Perry, Majel RAMAN, FNP       Or   ondansetron  (ZOFRAN ) injection 4 mg  4 mg Intravenous Q6H PRN Starkes-Perry, Majel RAMAN, FNP       thiamine  (VITAMIN B1) tablet 100 mg  100 mg Oral Daily Wilkie Majel RAMAN, FNP   100 mg at 08/24/24 0910   traZODone  (DESYREL ) tablet 50 mg  50 mg Oral QHS Starkes-Perry, Takia S, FNP   50 mg at 08/23/24 2144    Lab Results:  No results found for this or any previous visit (from the past 48 hours).   Blood Alcohol level:  Lab Results  Component Value Date   [facility] <15 05/02/2024   ETH <15 05/01/2024    Metabolic Disorder Labs: Lab Results  Component Value Date   HGBA1C 5.6 05/14/2024   MPG 114.02 05/14/2024   MPG 116.89 05/02/2024    Lab Results  Component Value Date   PROLACTIN 13.6 04/16/2015   Lab Results  Component Value Date   CHOL 219 (H) 05/14/2024   TRIG 84 05/14/2024   HDL 83 05/14/2024   CHOLHDL 2.6 05/14/2024   VLDL 17 05/14/2024   LDLCALC 119 (H) 05/14/2024   LDLCALC 130 (H) 05/01/2024    Physical Findings: AIMS:  , ,  ,  ,    CIWA:    COWS:      Psychiatric Specialty Exam:  Presentation  General Appearance:  Casual  Eye Contact: Fair  Speech: Normal rate  Speech Volume: Increased    Mood and Affect  Mood: Angry  Affect: Congruent   Thought Process  Thought Processes: Disorganized  Descriptions of Associations: Concrete Orientation:Partial  Thought Content: Discharge focused Hallucinations:denies Ideas of Reference:Paranoia; Delusions; Percusatory  Suicidal Thoughts:not endorsing Homicidal Thoughts:not endorsing  Sensorium  Memory: Recent Poor  Judgment: Poor  Insight: Poor   Executive Functions  Concentration: Poor  Attention Span: Poor  Recall: Poor  Fund of Knowledge: Poor  Language: Poor   Psychomotor Activity  Psychomotor Activity:No data recorded Musculoskeletal: Strength & Muscle Tone: within normal limits Gait & Station: normal Assets  Assets: Resilience    Physical Exam: Physical Exam Vitals and nursing note reviewed.    ROS Blood pressure 114/65, pulse 70, temperature (!) 97.3 F (36.3 C), resp. rate 16, height 5' 4 (1.626 m), weight 44.9 kg, SpO2 100%. Body mass index is 16.99 kg/m.  Diagnosis: Active Problems:   Schizophrenia, paranoid (HCC) Paranoid  PLAN: Safety and Monitoring:  -- Involuntary admission to inpatient psychiatric unit for safety, stabilization and treatment  -- Daily contact with patient to assess and evaluate symptoms and progress in treatment  -- Patient's case to be discussed in multi-disciplinary team meeting  -- Observation Level : q15 minute checks  -- Vital signs:  q12  hours  -- Precautions: suicide, elopement, and assault -- Encouraged patient to participate in unit milieu and in scheduled group therapies   2. Psychiatric Diagnoses and Treatment:   Haldol  5 mg BID Zyprexa  15 mg BID   LAI haldol  Dec 100mg  Q4 weeks-07/26/2024  . Discharge Planning:   -- Social work and case management to assist with discharge planning and identification of hospital follow-up needs prior to discharge  -- Estimated LOS: 3-4 days  Donaldson Richter, MD 08/24/2024, 12:18 PM "

## 2024-08-24 NOTE — Plan of Care (Signed)
 " Problem: Education: Goal: Knowledge of [facility] Education information/materials will improve 08/24/2024 1950 by Geneva Steward CROME, RN Outcome: Progressing 08/24/2024 1949 by Geneva Steward CROME, RN Reactivated Goal: Emotional status will improve 08/24/2024 1950 by Geneva Steward CROME, RN Outcome: Progressing 08/24/2024 1949 by Geneva Steward CROME, RN Reactivated Goal: Mental status will improve 08/24/2024 1950 by Geneva Steward CROME, RN Outcome: Progressing 08/24/2024 1949 by Geneva Steward CROME, RN Reactivated Goal: Verbalization of understanding the information provided will improve 08/24/2024 1950 by Geneva Steward CROME, RN Outcome: Progressing 08/24/2024 1949 by Geneva Steward CROME, RN Reactivated   Problem: Activity: Goal: Interest or engagement in activities will improve 08/24/2024 1950 by Geneva Steward CROME, RN Outcome: Progressing 08/24/2024 1949 by Geneva Steward CROME, RN Reactivated Goal: Sleeping patterns will improve 08/24/2024 1950 by Geneva Steward CROME, RN Outcome: Progressing 08/24/2024 1949 by Geneva Steward CROME, RN Reactivated   Problem: Coping: Goal: Ability to verbalize frustrations and anger appropriately will improve 08/24/2024 1950 by Geneva Steward CROME, RN Outcome: Progressing 08/24/2024 1949 by Geneva Steward CROME, RN Reactivated Goal: Ability to demonstrate self-control will improve 08/24/2024 1950 by Geneva Steward CROME, RN Outcome: Progressing 08/24/2024 1949 by Geneva Steward CROME, RN Reactivated   Problem: Health Behavior/Discharge Planning: Goal: Identification of resources available to assist in meeting health care needs will improve 08/24/2024 1950 by Geneva Steward CROME, RN Outcome: Progressing 08/24/2024 1949 by Geneva Steward CROME, RN Reactivated Goal: Compliance with treatment plan for underlying cause of condition will improve 08/24/2024 1950 by Geneva Steward CROME, RN Outcome: Progressing 08/24/2024 1949 by Geneva Steward CROME, RN Reactivated   Problem: Physical Regulation: Goal: Ability to maintain clinical  measurements within normal limits will improve 08/24/2024 1950 by Geneva Steward CROME, RN Outcome: Progressing 08/24/2024 1949 by Geneva Steward CROME, RN Reactivated   Problem: Safety: Goal: Periods of time without injury will increase 08/24/2024 1950 by Geneva Steward CROME, RN Outcome: Progressing 08/24/2024 1949 by Geneva Steward CROME, RN Reactivated   Problem: Education: Goal: Knowledge of [facility] Education information will improve Description: Including pain rating scale, medication(s)/side effects and non-pharmacologic comfort measures 08/24/2024 1950 by Geneva Steward CROME, RN Outcome: Progressing 08/24/2024 1949 by Geneva Steward CROME, RN Reactivated   Problem: Health Behavior/Discharge Planning: Goal: Ability to manage health-related needs will improve 08/24/2024 1950 by Geneva Steward CROME, RN Outcome: Progressing 08/24/2024 1949 by Geneva Steward CROME, RN Reactivated   Problem: Clinical Measurements: Goal: Ability to maintain clinical measurements within normal limits will improve 08/24/2024 1950 by Geneva Steward CROME, RN Outcome: Progressing 08/24/2024 1949 by Geneva Steward CROME, RN Reactivated Goal: Will remain free from infection 08/24/2024 1950 by Geneva Steward CROME, RN Outcome: Progressing 08/24/2024 1949 by Geneva Steward CROME, RN Reactivated Goal: Diagnostic test results will improve 08/24/2024 1950 by Geneva Steward CROME, RN Outcome: Progressing 08/24/2024 1949 by Geneva Steward CROME, RN Reactivated Goal: Respiratory complications will improve 08/24/2024 1950 by Geneva Steward CROME, RN Outcome: Progressing 08/24/2024 1949 by Geneva Steward CROME, RN Reactivated Goal: Cardiovascular complication will be avoided 08/24/2024 1950 by Geneva Steward CROME, RN Outcome: Progressing 08/24/2024 1949 by Geneva Steward CROME, RN Reactivated   Problem: Activity: Goal: Risk for activity intolerance will decrease 08/24/2024 1950 by Geneva Steward CROME, RN Outcome: Progressing 08/24/2024 1949 by Geneva Steward CROME, RN Reactivated   Problem: Nutrition: Goal: Adequate  nutrition will be maintained 08/24/2024 1950 by Geneva Steward CROME, RN Outcome: Progressing 08/24/2024 1949 by Geneva Steward CROME, RN Reactivated   Problem: Coping: Goal: Level of anxiety will decrease 08/24/2024 1950 by Pearlina Friedly,  Steward CROME, RN Outcome: Progressing 08/24/2024 1949 by Geneva Steward CROME, RN Reactivated   Problem: Elimination: Goal: Will not experience complications related to bowel motility 08/24/2024 1950 by Geneva Steward CROME, RN Outcome: Progressing 08/24/2024 1949 by Geneva Steward CROME, RN Reactivated Goal: Will not experience complications related to urinary retention 08/24/2024 1950 by Geneva Steward CROME, RN Outcome: Progressing 08/24/2024 1949 by Geneva Steward CROME, RN Reactivated   Problem: Pain Managment: Goal: General experience of comfort will improve and/or be controlled 08/24/2024 1950 by Geneva Steward CROME, RN Outcome: Progressing 08/24/2024 1949 by Geneva Steward CROME, RN Reactivated   Problem: Safety: Goal: Ability to remain free from injury will improve 08/24/2024 1950 by Geneva Steward CROME, RN Outcome: Progressing 08/24/2024 1949 by Geneva Steward CROME, RN Reactivated   Problem: Skin Integrity: Goal: Risk for impaired skin integrity will decrease 08/24/2024 1950 by Geneva Steward CROME, RN Outcome: Progressing 08/24/2024 1949 by Geneva Steward CROME, RN Reactivated   Problem: Health Behavior/Discharge Planning: Goal: Compliance with prescribed medication regimen will improve 08/24/2024 1950 by Geneva Steward CROME, RN Outcome: Progressing 08/24/2024 1949 by Geneva Steward CROME, RN Reactivated   Problem: Nutritional: Goal: Ability to achieve adequate nutritional intake will improve 08/24/2024 1950 by Geneva Steward CROME, RN Outcome: Progressing 08/24/2024 1949 by Geneva Steward CROME, RN Reactivated   "

## 2024-08-24 NOTE — [facility] Counselor (Signed)
 CSW received call from DSS caseworker, Etha Chang on 08/25/24.   Nichole asks CSW if pt would need Medicaid to qualify for ACTT or CST services. CSW informed Nichole that Medicaid is a requirement for these services.   Nichole reports at this time she has no bed offers for SNF for pt.   Nichole reports that pt makes too much money monthly to qualify for Medicaid, so group home or assisted living facility placement also is not an option.   Nichole reports with guardianship hearing pending for next week, the brother will be assuming the responsibility of guardian, that pt may have to discharge to the brother's home, but Nichole reports there are concerns there because the brother cannot meet pt's mental healthcare needs.   Nichole reports pt could get an apartment a lone if there were wrap around services (home health or 24/7 help) that could support pt but reports that is unlikely.   CSW offers support to placement search by reaching out to leadership Fredrick Sprang, Yvetta Grier) via email to see if there are any SNF placements that they know of that would take pt or if they have any insights that can be provided to placement search.   CSW awaits response at this time.   [name] Croft, MSW, [place] 08/24/2024 11:26 AM

## 2024-08-25 MED ORDER — HALOPERIDOL DECANOATE 100 MG/ML IM SOLN
100.0000 mg | Freq: Once | INTRAMUSCULAR | Status: AC
  Administered 2024-08-25: 100 mg via INTRAMUSCULAR
  Filled 2024-08-25: qty 1

## 2024-08-25 NOTE — Progress Notes (Signed)
 " [facility] MD Progress Note   10:42 AM [name]  MRN:  [id number]  [name] is a 64 y.o. female admitted: Medicallyfor 05/02/2024 10:56 AM for dehydration, failure to thrive and hypoglycemia. She carries the psychiatric diagnoses of Schizophrenia and has medical hx of hypoglycemia and failure to thrive and has a past medical history of sinus bradycardia, chronic anemia, severe malnutrition and recurrent hypoglycemia.. .  Patient has been psychiatrically stable and cleared for discharge with no safe disposition.  Patient is determined to lack capacity and APS obtained legal guardianship and working on safe disposition.   Subjective:  Chart reviewed, case discussed in multidisciplinary meeting, patient seen during rounds.    Patient is noted to be resting in her bed.  She offers no complaints.  She remains delusional and unable to process the need for safe placement.  Patient remains irritable and refuses to answer questions related to mental health.  She is not displaying any self-harming behaviors.  She has not had any aggressive behaviors.  Past Psychiatric History: see h&P Family History: History reviewed. No pertinent family history. Social History:  Social History   Substance and Sexual Activity  Alcohol Use Not Currently   Comment: Refuses to disclose how much     Social History   Substance and Sexual Activity  Drug Use No   Comment: Refuses to answer    Social History   Socioeconomic History   Marital status: Single    Spouse name: Not on file   Number of children: Not on file   Years of education: Not on file   Highest education level: Not on file  Occupational History   Not on file  Tobacco Use   Smoking status: Every Day    Current packs/day: 2.00    Types: Cigarettes    Passive exposure: Current   Smokeless tobacco: Never  Vaping Use   Vaping status: Never Used  Substance and Sexual Activity   Alcohol use: Not Currently    Comment: Refuses to disclose how  much   Drug use: No    Comment: Refuses to answer   Sexual activity: Not Currently    Comment: refused to answer  Other Topics Concern   Not on file  Social History Narrative   Not on file   Social Drivers of Health   Tobacco Use: High Risk (05/06/2024)   Patient History    Smoking Tobacco Use: Every Day    Smokeless Tobacco Use: Never    Passive Exposure: Current  Financial Resource Strain: Not on file  Food Insecurity: Patient Declined (05/06/2024)   Epic    Worried About Programme Researcher, Broadcasting/film/video in the Last Year: Patient declined    Barista in the Last Year: Patient declined  Transportation Needs: Patient Declined (05/06/2024)   Epic    Lack of Transportation (Medical): Patient declined    Lack of Transportation (Non-Medical): Patient declined  Physical Activity: Not on file  Stress: Not on file  Social Connections: Not on file  Depression (PHQ2-9): Not on file  Alcohol Screen: Low Risk (05/06/2024)   Alcohol Screen    Last Alcohol Screening Score (AUDIT): 0  Housing: Unknown (05/06/2024)   Epic    Unable to Pay for Housing in the Last Year: Patient declined    Number of Times Moved in the Last Year: Not on file    Homeless in the Last Year: Not on file  Utilities: Patient Declined (05/06/2024)   Epic  Threatened with loss of utilities: Patient declined  Health Literacy: Not on file   Past Medical History:  Past Medical History:  Diagnosis Date   Anemia 10/02/2021   Non compliance w medication regimen    Schizophrenia [facility])     Past Surgical History:  Procedure Laterality Date   BIOPSY  09/25/2021   Procedure: BIOPSY;  Surgeon: Teressa Toribio SQUIBB, MD;  Location: [facility];  Service: Gastroenterology;;   ESOPHAGOGASTRODUODENOSCOPY (EGD) WITH PROPOFOL  [name] 09/25/2021   Procedure: ESOPHAGOGASTRODUODENOSCOPY (EGD) WITH PROPOFOL ;  Surgeon: Teressa Toribio SQUIBB, MD;  Location: [facility];  Service: Gastroenterology;  Laterality: [name];  With NGT placement    Current  Medications: Current Facility-Administered Medications  Medication Dose Route Frequency Provider Last Rate Last Admin   acetaminophen  (TYLENOL ) tablet 650 mg  650 mg Oral Q6H PRN Starkes-Perry, Takia S, FNP       alum & mag hydroxide-simeth (MAALOX/MYLANTA) 200-200-20 MG/5ML suspension 30 mL  30 mL Oral Q4H PRN Starkes-Perry, Takia S, FNP       haloperidol  (HALDOL ) tablet 5 mg  5 mg Oral BID Augusta Mirkin, MD   5 mg at 08/25/24 0932   HYDROcodone -acetaminophen  (NORCO/VICODIN) 5-325 MG per tablet 1-2 tablet  1-2 tablet Oral Q4H PRN Starkes-Perry, Takia S, FNP       magnesium  hydroxide (MILK OF MAGNESIA) suspension 30 mL  30 mL Oral Daily PRN Starkes-Perry, Takia S, FNP       multivitamin with minerals tablet 1 tablet  1 tablet Oral Daily Wilkie Majel RAMAN, FNP   1 tablet at 08/25/24 0932   OLANZapine  (ZYPREXA ) injection 5 mg  5 mg Intramuscular TID PRN Starkes-Perry, Takia S, FNP       OLANZapine  (ZYPREXA ) tablet 15 mg  15 mg Oral BID Crist Kruszka, MD   15 mg at 08/25/24 0932   OLANZapine  zydis (ZYPREXA ) disintegrating tablet 5 mg  5 mg Oral TID PRN Starkes-Perry, Takia S, FNP       ondansetron  (ZOFRAN ) tablet 4 mg  4 mg Oral Q6H PRN Wilkie Majel RAMAN, FNP       Or   ondansetron  (ZOFRAN ) injection 4 mg  4 mg Intravenous Q6H PRN Starkes-Perry, Majel RAMAN, FNP       thiamine  (VITAMIN B1) tablet 100 mg  100 mg Oral Daily Starkes-Perry, Takia S, FNP   100 mg at 08/25/24 0932   traZODone  (DESYREL ) tablet 50 mg  50 mg Oral QHS Starkes-Perry, Takia S, FNP   50 mg at 08/24/24 2117    Lab Results:  No results found for this or any previous visit (from the past 48 hours).   Blood Alcohol level:  Lab Results  Component Value Date   [facility] <15 05/02/2024   ETH <15 05/01/2024    Metabolic Disorder Labs: Lab Results  Component Value Date   HGBA1C 5.6 05/14/2024   MPG 114.02 05/14/2024   MPG 116.89 05/02/2024   Lab Results  Component Value Date   PROLACTIN 13.6 04/16/2015   Lab  Results  Component Value Date   CHOL 219 (H) 05/14/2024   TRIG 84 05/14/2024   HDL 83 05/14/2024   CHOLHDL 2.6 05/14/2024   VLDL 17 05/14/2024   LDLCALC 119 (H) 05/14/2024   LDLCALC 130 (H) 05/01/2024    Physical Findings: AIMS:  , ,  ,  ,    CIWA:    COWS:      Psychiatric Specialty Exam:  Presentation  General Appearance:  Casual  Eye Contact: Fair  Speech: Normal rate  Speech Volume: Increased    Mood and Affect  Mood: Angry  Affect: Congruent   Thought Process  Thought Processes: Disorganized  Descriptions of Associations: Concrete Orientation:Partial  Thought Content: Discharge focused Hallucinations:denies Ideas of Reference:Paranoia; Delusions; Percusatory  Suicidal Thoughts:not endorsing Homicidal Thoughts:not endorsing  Sensorium  Memory: Recent Poor  Judgment: Poor  Insight: Poor   Executive Functions  Concentration: Poor  Attention Span: Poor  Recall: Poor  Fund of Knowledge: Poor  Language: Poor   Psychomotor Activity  Psychomotor Activity:No data recorded Musculoskeletal: Strength & Muscle Tone: within normal limits Gait & Station: normal Assets  Assets: Resilience    Physical Exam: Physical Exam Vitals and nursing note reviewed.    ROS Blood pressure 111/66, pulse 62, temperature (!) 97.4 F (36.3 C), resp. rate 14, height 5' 4 (1.626 m), weight 44.9 kg, SpO2 100%. Body mass index is 16.99 kg/m.  Diagnosis: Active Problems:   Schizophrenia, paranoid (HCC) Paranoid  PLAN: Safety and Monitoring:  -- Involuntary admission to inpatient psychiatric unit for safety, stabilization and treatment  -- Daily contact with patient to assess and evaluate symptoms and progress in treatment  -- Patient's case to be discussed in multi-disciplinary team meeting  -- Observation Level : q15 minute checks  -- Vital signs:  q12 hours  -- Precautions: suicide, elopement, and assault -- Encouraged patient to  participate in unit milieu and in scheduled group therapies   2. Psychiatric Diagnoses and Treatment:   Haldol  5 mg BID Zyprexa  15 mg BID   LAI haldol  Dec 100mg  Q4 weeks-07/26/2024;08/25/24  . Discharge Planning:   -- Social work and case management to assist with discharge planning and identification of hospital follow-up needs prior to discharge  -- Estimated LOS: 3-4 days  Kaizer Dissinger, MD 08/25/2024, 10:42 AM "

## 2024-08-25 NOTE — [facility] Group Notes (Signed)
 Spirituality Group   Description: Participant directed exploration of values, beliefs and meaning   **Focus on Gratitude: Invite reflection on sources of gratitude (external/internal); goal to invite internal gratitude to foster 1) reconnection with life-giving activities 2) self-compassion.   Following a brief framework of chaplains role and ground rules of group behavior, participants are invited to share concerns or questions that engage spiritual life. Emphasis placed on common themes and shared experiences and ways to make meaning and clarify living into ones values.   Theory/Process/Goal: Utilize the theoretical framework of group therapy established by Celena Kite, Relational Cultural Theory and Rogerian approaches to facilitate relational empathy and use of the here and now to foster reflection, self-awareness, and sharing.   Observations: Did not attend  [name] L. Delores HERO.Div

## 2024-08-25 NOTE — Progress Notes (Signed)
 Patient quiet but responsive during interaction. Present in the dayroom for meals. Isolates to room otherwise. Denies SI/HI/AVH. Haldol  LAI given. Patient accepted that and other medications without issue. Ongoing monitoring continues.   08/25/24 1100  Psych Admission Type (Psych Patients Only)  Admission Status Involuntary  Psychosocial Assessment  Patient Complaints None  Eye Contact Brief  Facial Expression Flat  Affect Flat  Speech Soft  Interaction Isolative  Motor Activity Slow  Appearance/Hygiene In scrubs  Behavior Characteristics Cooperative;Appropriate to situation  Mood Pleasant  Thought Process  Coherency WDL  Content WDL  Delusions None reported or observed  Perception WDL  Hallucination None reported or observed  Judgment Limited  Confusion None  Danger to Self  Current suicidal ideation? Denies  Agreement Not to Harm Self Yes  Description of Agreement verbal  Danger to Others  Danger to Others None reported or observed

## 2024-08-25 NOTE — Plan of Care (Signed)
   Problem: Education: Goal: Emotional status will improve Outcome: Not Progressing Goal: Mental status will improve Outcome: Not Progressing Goal: Verbalization of understanding the information provided will improve Outcome: Not Progressing

## 2024-08-25 NOTE — BH IP Treatment Plan (Signed)
 Interdisciplinary Treatment and Diagnostic Plan Update  08/25/2024 Time of Session: 9:00 AM  [name] MRN: [id number]  Principal Diagnosis: <principal problem not specified>  Secondary Diagnoses: Active Problems:   Schizophrenia, paranoid (HCC)   Current Medications:  Current Facility-Administered Medications  Medication Dose Route Frequency Provider Last Rate Last Admin   acetaminophen  (TYLENOL ) tablet 650 mg  650 mg Oral Q6H PRN Starkes-Perry, Takia S, FNP       alum & mag hydroxide-simeth (MAALOX/MYLANTA) 200-200-20 MG/5ML suspension 30 mL  30 mL Oral Q4H PRN Starkes-Perry, Takia S, FNP       haloperidol  (HALDOL ) tablet 5 mg  5 mg Oral BID Jadapalle, Sree, MD   5 mg at 08/25/24 0932   haloperidol  decanoate (HALDOL  DECANOATE) 100 MG/ML injection 100 mg  100 mg Intramuscular Once Jadapalle, Sree, MD       HYDROcodone -acetaminophen  (NORCO/VICODIN) 5-325 MG per tablet 1-2 tablet  1-2 tablet Oral Q4H PRN Starkes-Perry, Takia S, FNP       magnesium  hydroxide (MILK OF MAGNESIA) suspension 30 mL  30 mL Oral Daily PRN Starkes-Perry, Takia S, FNP       multivitamin with minerals tablet 1 tablet  1 tablet Oral Daily Wilkie Majel RAMAN, FNP   1 tablet at 08/25/24 0932   OLANZapine  (ZYPREXA ) injection 5 mg  5 mg Intramuscular TID PRN Starkes-Perry, Takia S, FNP       OLANZapine  (ZYPREXA ) tablet 15 mg  15 mg Oral BID Jadapalle, Sree, MD   15 mg at 08/25/24 0932   OLANZapine  zydis (ZYPREXA ) disintegrating tablet 5 mg  5 mg Oral TID PRN Starkes-Perry, Takia S, FNP       ondansetron  (ZOFRAN ) tablet 4 mg  4 mg Oral Q6H PRN Wilkie Majel RAMAN, FNP       Or   ondansetron  (ZOFRAN ) injection 4 mg  4 mg Intravenous Q6H PRN Starkes-Perry, Takia S, FNP       thiamine  (VITAMIN B1) tablet 100 mg  100 mg Oral Daily Starkes-Perry, Takia S, FNP   100 mg at 08/25/24 0932   traZODone  (DESYREL ) tablet 50 mg  50 mg Oral QHS Starkes-Perry, Takia S, FNP   50 mg at 08/24/24 2117   PTA Medications: Medications  Prior to Admission  Medication Sig Dispense Refill Last Dose/Taking   feeding supplement (ENSURE PLUS HIGH PROTEIN) LIQD Take 237 mLs by mouth 3 (three) times daily between meals.      Multiple Vitamin (MULTIVITAMIN WITH MINERALS) TABS tablet Take 1 tablet by mouth daily.      OLANZapine  (ZYPREXA ) 5 MG tablet Take 1 tablet (5 mg total) by mouth 2 (two) times daily.      OLANZapine  (ZYPREXA ) injection Inject 1 mL (5 mg total) into the muscle 2 (two) times daily.      thiamine  (VITAMIN B-1) 100 MG tablet Take 1 tablet (100 mg total) by mouth daily.      traZODone  (DESYREL ) 50 MG tablet Take 1 tablet (50 mg total) by mouth at bedtime.       Patient Stressors:    Patient Strengths:    Treatment Modalities: Medication Management, Group therapy, Case management,  1 to 1 session with clinician, Psychoeducation, Recreational therapy.   Physician Treatment Plan for Primary Diagnosis: <principal problem not specified> Long Term Goal(s): Improvement in symptoms so as ready for discharge   Short Term Goals: Ability to identify changes in lifestyle to reduce recurrence of condition will improve  Medication Management: Evaluate patient's response, side effects, and tolerance of medication regimen.  Therapeutic Interventions: 1 to 1 sessions, Unit Group sessions and Medication administration.  Evaluation of Outcomes: Adequate for Discharge  Physician Treatment Plan for Secondary Diagnosis: Active Problems:   Schizophrenia, paranoid (HCC)  Long Term Goal(s): Improvement in symptoms so as ready for discharge   Short Term Goals: Ability to identify changes in lifestyle to reduce recurrence of condition will improve     Medication Management: Evaluate patient's response, side effects, and tolerance of medication regimen.  Therapeutic Interventions: 1 to 1 sessions, Unit Group sessions and Medication administration.  Evaluation of Outcomes: Adequate for Discharge   RN Treatment Plan for  Primary Diagnosis: <principal problem not specified> Long Term Goal(s): Knowledge of disease and therapeutic regimen to maintain health will improve  Short Term Goals: Ability to remain free from injury will improve, Ability to verbalize frustration and anger appropriately will improve, Ability to demonstrate self-control, Ability to participate in decision making will improve, Ability to verbalize feelings will improve, Ability to disclose and discuss suicidal ideas, Ability to identify and develop effective coping behaviors will improve, and Compliance with prescribed medications will improve  Medication Management: RN will administer medications as ordered by provider, will assess and evaluate patient's response and provide education to patient for prescribed medication. RN will report any adverse and/or side effects to prescribing provider.  Therapeutic Interventions: 1 on 1 counseling sessions, Psychoeducation, Medication administration, Evaluate responses to treatment, Monitor vital signs and CBGs as ordered, Perform/monitor CIWA, COWS, AIMS and Fall Risk screenings as ordered, Perform wound care treatments as ordered.  Evaluation of Outcomes: Adequate for Discharge   LCSW Treatment Plan for Primary Diagnosis: <principal problem not specified> Long Term Goal(s): Safe transition to appropriate next level of care at discharge, Engage patient in therapeutic group addressing interpersonal concerns.  Short Term Goals: Engage patient in aftercare planning with referrals and resources, Increase social support, Increase ability to appropriately verbalize feelings, Increase emotional regulation, Facilitate acceptance of mental health diagnosis and concerns, Facilitate patient progression through stages of change regarding substance use diagnoses and concerns, Identify triggers associated with mental health/substance abuse issues, and Increase skills for wellness and recovery  Therapeutic Interventions:  Assess for all discharge needs, 1 to 1 time with Social worker, Explore available resources and support systems, Assess for adequacy in community support network, Educate family and significant other(s) on suicide prevention, Complete Psychosocial Assessment, Interpersonal group therapy.  Evaluation of Outcomes: Adequate for Discharge   Progress in Treatment: Attending groups: No. Participating in groups: No. Taking medication as prescribed: Yes. Toleration medication: Yes. Family/Significant other contact made: Yes, individual(s) contacted:  CSW has contacted pt's DSS caseworker, Etha Chang  Patient understands diagnosis: Yes. Discussing patient identified problems/goals with staff: No. Medical problems stabilized or resolved: Yes. Denies suicidal/homicidal ideation: Yes. Issues/concerns per patient self-inventory: No. Other: None     New problem(s) identified: No, Describe:  none 08/20/2024: No changes at this time Update 08/25/24: No changes at this time    New Short Term/Long Term Goal(s):elimination of symptoms of psychosis, medication management for mood stabilization; elimination of SI thoughts; development of comprehensive mental wellness plan. Update: 05/13/2024 No changes at this time.Update: 05/18/2024 No changes at this time. Update 05/23/2024:  No changes at this time. Update 05/28/2024:  No changes at this time. Update 06/02/2024:  No changes at this time. Update 06/07/2024:  No changes at this time. 06/12/24 Update: No changes at this time. Update 06/18/24:  No changes at this time  Update 06/24/24:  No changes at this time Update  06/29/24:  No changes at this time 07/04/24 Update: No changes. 07/09/24: No change 07/14/2024: No changes at this time.  Update 07/19/24: No changes at this time Update 07/25/23: No changes at this time Update 07/29/24 no changes  Update 08/03/2024:  No changes at this time.  Update 08/08/2024: No changes at this time. Update 08/14/2024: No changes at  this time. 08/20/2024: No changes at this time Update 08/25/24: No changes at this time      Patient Goals:    I just need to leave, I don't need it hereUpdate: 05/13/2024 No changes at this time.Update: 05/18/2024 No changes at this time. Update 05/23/2024:  No changes at this time.Update 05/28/2024:  No changes at this time. Update 06/02/2024:  No changes at this time. Update 06/07/2024:  No changes at this time. 06/12/24 Update: No changes at this time. Update 06/18/24:  No changes at this time Update 06/24/24:  No changes at this time Update 06/29/24:  No changes at this time 07/04/24 Update: No changes. 07/09/24: No changes at this time.  07/14/2024: No changes at this time.   Update 07/19/24: No changes at this time Update 07/25/23: No changes at this time Update: 07/29/24  Update 08/03/2024:  No changes at this time. Update 08/08/2024: No changes at this time. Update 08/14/2024: No changes at this time. 08/20/2024: No changes at this time Update 08/25/24: No changes at this time Update 08/25/24: No changes at this time         Discharge Plan or Barriers:  CSW will assist with appropriate discharge planning. Update: 05/13/2024 No changes at this time.Update: 05/18/2024 No changes at this time. Update 05/23/2024:  No changes at this time.Update 05/28/2024:  CSW unable to identify appropriate decision maker, as pt's legal guardian has passed and CSW has reached out to APS caseworker multiple times to get clarity on protocol for enforcing medication until an appropriate party has been legal identified Update 06/02/2024:  No changes at this time. Update 06/07/2024:  CSW and provider still working to clarify appropriate horticulturist, commercial. Please refer to extensive notes in medical record. 06/12/24 Update: CSW and provider still working with DSS to obtain legal decision maker on patient's behalf. Update 06/18/24:  No changes at this time Update 06/24/24:  DSS received ex-parte, DSS to begin looking for placement,  Dr.J to initiate enforced medication Update 06/29/24:  No changes at this time 07/04/24 Update: CSW to continue to provide support to APS case worker while DSS works on clinical cytogeneticist. No changes. 07/09/24 07/14/2024: Possible bed at [facility], CSW team to follow up with guardian regarding this.   Update 07/19/24: No changes at this time  Update 07/25/23: No changes at this time  Update 07/29/24 No changes at this time.  Update 08/03/2024:  Placement is still being sought.  Update 08/08/2024: Placement remains a barrier at this time. Update 08/14/2024: No changes at this time. 08/20/2024: No changes at this time Update 08/25/24: No changes at this time      Reason for Continuation of Hospitalization: Aggression Hallucinations   Estimated Length of Stay:TBD   Update 08/03/2024:  TBD Update 08/08/2024: TBD Update 08/14/2024: TBD  Update 08/20/2024: TBD Update 08/25/24: TBD    Last 3 [place] Suicide Severity Risk Score: Flowsheet Row Admission (Current) from 05/06/2024 in [facility] to Hosp-Admission (Discharged) from 05/02/2024 in [facility] ED from 04/30/2024 in [facility]  C-SSRS RISK CATEGORY No Risk  No Risk No Risk    Last PHQ 2/9 Scores:     No data to display          Scribe for Treatment Team: [name], ISRAEL 08/25/2024 11:46 AM

## 2024-08-25 NOTE — Group Note (Signed)
 Physical/Occupational Therapy Group Note  Group Topic: UE Therex   Group Date: 08/25/2024 Start Time: 1305 End Time: 1335 Facilitators: Clive Warren CROME, OT   Group Description: Group instructed in series of upper extremities exercises, aimed to promote strength, flexibility, range of motion and functional endurance.  Patients provided cuing for proper mechanics and proper pace of exercise; exercises adjusted as necessary for individualized patient needs.  Patient also engaged in cognitive components throughout session, working to integrate attention to task, command following, turn-taking and appropriate social interaction throughout session.  Allowed to ask questions as appropriate, and encouraged to identify specific exercises that they could complete independently outside of group sessions.  Therapeutic Goal(s):  Demonstrate appropriate performance of upper extremity exercises to promote strength, flexibility, range of motion and functional endurance Identify 2-3 specific upper extremity exercises to complete as home exercise program outside of group session  Individual Participation: Pt did not attend.  Participation Level: Did not attend   Participation Quality:   Behavior:   Speech/Thought Process:   Affect/Mood:   Insight:   Judgement:   Modes of Intervention:   Patient Response to Interventions:    Plan: Continue to engage patient in PT/OT groups 1 - 2x/week.  Jaden Batchelder R., MPH, MS, OTR/L ascom [phone] 08/25/24, 1:45 PM

## 2024-08-25 NOTE — Group Note (Signed)
 Date:  08/25/2024 Time:  11:20 AM  Group Topic/Focus:  Making Healthy Choices:   The focus of this group is to help patients identify negative/unhealthy choices they were using prior to admission and identify positive/healthier coping strategies to replace them upon discharge.    Participation Level:  Did Not Attend    [name] 08/25/2024, 11:20 AM

## 2024-08-25 NOTE — Group Note (Signed)
 Recreation Therapy Group Note   Group Topic:Emotion Expression  Group Date: 08/25/2024 Start Time: 1400 End Time: 1450 Facilitators: Celestia Jeoffrey BRAVO, LRT, CTRS Location: Dayroom  Group Description: Patients will engage in a guided art activity focused on painting a personal peaceful place. This group encourages relaxation, self-expression, and mindfulness through creative exploration. Participants are invited to imagine a location where they feel calm, safe, or content and represent it using paint and other art materials. Emphasis is placed on the process rather than artistic skill, allowing individuals to express emotions, reduce stress, and increase self-awareness in a supportive group setting.  Goal Area(s): Promote relaxation and stress reduction Enhance emotional expression and self-awareness Support coping skills and grounding techniques Encourage creativity and positive leisure engagement [place] interaction and group participation   Affect/Mood: [name]   Participation Level: Did not attend    Clinical Observations/Individualized Feedback: Patient did not attend.  Plan: Continue to engage patient in RT group sessions 2-3x/week.   Jeoffrey BRAVO Celestia, LRT, CTRS 08/25/2024 4:36 PM
